# Patient Record
Sex: Female | Born: 1950 | Race: White | Hispanic: No | Smoking: Former smoker
Health system: Southern US, Community
[De-identification: ages and names within clinical notes are randomized; demographics above are authoritative.]

## PROBLEM LIST (undated history)

## (undated) DIAGNOSIS — F209 Schizophrenia, unspecified: Secondary | ICD-10-CM

## (undated) DIAGNOSIS — F29 Unspecified psychosis not due to a substance or known physiological condition: Secondary | ICD-10-CM

## (undated) DIAGNOSIS — K8020 Calculus of gallbladder without cholecystitis without obstruction: Secondary | ICD-10-CM

## (undated) DIAGNOSIS — R51 Headache: Secondary | ICD-10-CM

## (undated) DIAGNOSIS — J189 Pneumonia, unspecified organism: Secondary | ICD-10-CM

## (undated) DIAGNOSIS — I209 Angina pectoris, unspecified: Secondary | ICD-10-CM

## (undated) DIAGNOSIS — R569 Unspecified convulsions: Secondary | ICD-10-CM

## (undated) DIAGNOSIS — E669 Obesity, unspecified: Secondary | ICD-10-CM

## (undated) DIAGNOSIS — Z9289 Personal history of other medical treatment: Secondary | ICD-10-CM

## (undated) DIAGNOSIS — G40909 Epilepsy, unspecified, not intractable, without status epilepticus: Secondary | ICD-10-CM

## (undated) DIAGNOSIS — I4892 Unspecified atrial flutter: Secondary | ICD-10-CM

## (undated) DIAGNOSIS — G8929 Other chronic pain: Secondary | ICD-10-CM

## (undated) DIAGNOSIS — R519 Headache, unspecified: Secondary | ICD-10-CM

## (undated) DIAGNOSIS — E079 Disorder of thyroid, unspecified: Secondary | ICD-10-CM

## (undated) DIAGNOSIS — K219 Gastro-esophageal reflux disease without esophagitis: Secondary | ICD-10-CM

## (undated) DIAGNOSIS — I4891 Unspecified atrial fibrillation: Secondary | ICD-10-CM

## (undated) DIAGNOSIS — F319 Bipolar disorder, unspecified: Secondary | ICD-10-CM

## (undated) DIAGNOSIS — I10 Essential (primary) hypertension: Secondary | ICD-10-CM

## (undated) DIAGNOSIS — R0689 Other abnormalities of breathing: Secondary | ICD-10-CM

## (undated) DIAGNOSIS — J449 Chronic obstructive pulmonary disease, unspecified: Secondary | ICD-10-CM

## (undated) DIAGNOSIS — F419 Anxiety disorder, unspecified: Secondary | ICD-10-CM

## (undated) DIAGNOSIS — D649 Anemia, unspecified: Secondary | ICD-10-CM

## (undated) DIAGNOSIS — R0600 Dyspnea, unspecified: Secondary | ICD-10-CM

## (undated) DIAGNOSIS — F70 Mild intellectual disabilities: Secondary | ICD-10-CM

## (undated) DIAGNOSIS — N390 Urinary tract infection, site not specified: Secondary | ICD-10-CM

## (undated) DIAGNOSIS — M1990 Unspecified osteoarthritis, unspecified site: Secondary | ICD-10-CM

## (undated) DIAGNOSIS — E039 Hypothyroidism, unspecified: Secondary | ICD-10-CM

## (undated) DIAGNOSIS — E7800 Pure hypercholesterolemia, unspecified: Secondary | ICD-10-CM

## (undated) DIAGNOSIS — F39 Unspecified mood [affective] disorder: Secondary | ICD-10-CM

## (undated) DIAGNOSIS — I219 Acute myocardial infarction, unspecified: Secondary | ICD-10-CM

## (undated) DIAGNOSIS — I509 Heart failure, unspecified: Secondary | ICD-10-CM

## (undated) DIAGNOSIS — Z8719 Personal history of other diseases of the digestive system: Secondary | ICD-10-CM

## (undated) DIAGNOSIS — K449 Diaphragmatic hernia without obstruction or gangrene: Secondary | ICD-10-CM

## (undated) DIAGNOSIS — R0609 Other forms of dyspnea: Secondary | ICD-10-CM

## (undated) DIAGNOSIS — R0602 Shortness of breath: Secondary | ICD-10-CM

## (undated) DIAGNOSIS — F259 Schizoaffective disorder, unspecified: Secondary | ICD-10-CM

## (undated) HISTORY — DX: Mild intellectual disabilities: F70

## (undated) HISTORY — DX: Calculus of gallbladder without cholecystitis without obstruction: K80.20

## (undated) HISTORY — PX: CHOLECYSTECTOMY: SHX55

---

## 1991-06-21 DIAGNOSIS — Z9289 Personal history of other medical treatment: Secondary | ICD-10-CM

## 1991-06-21 HISTORY — DX: Personal history of other medical treatment: Z92.89

## 1991-06-21 HISTORY — PX: VAGINAL HYSTERECTOMY: SUR661

## 1996-06-20 HISTORY — PX: TUBAL LIGATION: SHX77

## 1997-10-14 ENCOUNTER — Inpatient Hospital Stay (HOSPITAL_COMMUNITY): Admission: AD | Admit: 1997-10-14 | Discharge: 1997-10-19 | Payer: Self-pay | Admitting: Psychiatry

## 1998-01-16 ENCOUNTER — Other Ambulatory Visit: Admission: RE | Admit: 1998-01-16 | Discharge: 1998-01-16 | Payer: Self-pay | Admitting: Family Medicine

## 1998-01-26 ENCOUNTER — Emergency Department (HOSPITAL_COMMUNITY): Admission: EM | Admit: 1998-01-26 | Discharge: 1998-01-26 | Payer: Self-pay | Admitting: Emergency Medicine

## 1998-02-10 ENCOUNTER — Inpatient Hospital Stay (HOSPITAL_COMMUNITY): Admission: EM | Admit: 1998-02-10 | Discharge: 1998-02-13 | Payer: Self-pay | Admitting: Emergency Medicine

## 1998-03-20 ENCOUNTER — Emergency Department (HOSPITAL_COMMUNITY): Admission: EM | Admit: 1998-03-20 | Discharge: 1998-03-20 | Payer: Self-pay | Admitting: Emergency Medicine

## 1998-04-30 ENCOUNTER — Encounter: Payer: Self-pay | Admitting: Emergency Medicine

## 1998-04-30 ENCOUNTER — Inpatient Hospital Stay (HOSPITAL_COMMUNITY): Admission: EM | Admit: 1998-04-30 | Discharge: 1998-05-04 | Payer: Self-pay | Admitting: Emergency Medicine

## 1998-05-02 ENCOUNTER — Encounter: Payer: Self-pay | Admitting: Specialist

## 1998-06-15 ENCOUNTER — Inpatient Hospital Stay (HOSPITAL_COMMUNITY): Admission: RE | Admit: 1998-06-15 | Discharge: 1998-06-17 | Payer: Self-pay | Admitting: *Deleted

## 1998-06-15 ENCOUNTER — Emergency Department (HOSPITAL_COMMUNITY): Admission: EM | Admit: 1998-06-15 | Discharge: 1998-06-15 | Payer: Self-pay | Admitting: Emergency Medicine

## 1998-07-25 ENCOUNTER — Emergency Department (HOSPITAL_COMMUNITY): Admission: EM | Admit: 1998-07-25 | Discharge: 1998-07-26 | Payer: Self-pay | Admitting: Emergency Medicine

## 1998-07-26 ENCOUNTER — Encounter: Payer: Self-pay | Admitting: Emergency Medicine

## 1998-07-31 ENCOUNTER — Inpatient Hospital Stay (HOSPITAL_COMMUNITY): Admission: EM | Admit: 1998-07-31 | Discharge: 1998-08-04 | Payer: Self-pay | Admitting: Emergency Medicine

## 1998-08-31 ENCOUNTER — Emergency Department (HOSPITAL_COMMUNITY): Admission: EM | Admit: 1998-08-31 | Discharge: 1998-08-31 | Payer: Self-pay | Admitting: Emergency Medicine

## 1998-10-20 ENCOUNTER — Inpatient Hospital Stay (HOSPITAL_COMMUNITY): Admission: EM | Admit: 1998-10-20 | Discharge: 1998-10-22 | Payer: Self-pay | Admitting: Emergency Medicine

## 1998-12-29 ENCOUNTER — Emergency Department (HOSPITAL_COMMUNITY): Admission: EM | Admit: 1998-12-29 | Discharge: 1998-12-29 | Payer: Self-pay | Admitting: Emergency Medicine

## 1998-12-30 ENCOUNTER — Encounter: Payer: Self-pay | Admitting: Emergency Medicine

## 1999-07-22 ENCOUNTER — Emergency Department (HOSPITAL_COMMUNITY): Admission: EM | Admit: 1999-07-22 | Discharge: 1999-07-22 | Payer: Self-pay | Admitting: Emergency Medicine

## 1999-08-16 ENCOUNTER — Encounter: Payer: Self-pay | Admitting: Family Medicine

## 1999-08-16 ENCOUNTER — Ambulatory Visit (HOSPITAL_COMMUNITY): Admission: RE | Admit: 1999-08-16 | Discharge: 1999-08-16 | Payer: Self-pay | Admitting: Family Medicine

## 1999-10-29 ENCOUNTER — Emergency Department (HOSPITAL_COMMUNITY): Admission: EM | Admit: 1999-10-29 | Discharge: 1999-10-29 | Payer: Self-pay | Admitting: Emergency Medicine

## 1999-12-28 ENCOUNTER — Emergency Department (HOSPITAL_COMMUNITY): Admission: EM | Admit: 1999-12-28 | Discharge: 1999-12-28 | Payer: Self-pay | Admitting: Emergency Medicine

## 1999-12-28 ENCOUNTER — Encounter: Payer: Self-pay | Admitting: Emergency Medicine

## 2000-02-04 ENCOUNTER — Encounter: Payer: Self-pay | Admitting: Internal Medicine

## 2000-02-04 ENCOUNTER — Encounter: Admission: RE | Admit: 2000-02-04 | Discharge: 2000-02-04 | Payer: Self-pay | Admitting: Internal Medicine

## 2000-02-24 ENCOUNTER — Emergency Department (HOSPITAL_COMMUNITY): Admission: EM | Admit: 2000-02-24 | Discharge: 2000-02-24 | Payer: Self-pay | Admitting: Emergency Medicine

## 2000-02-28 ENCOUNTER — Encounter: Payer: Self-pay | Admitting: Internal Medicine

## 2000-02-28 ENCOUNTER — Ambulatory Visit (HOSPITAL_COMMUNITY): Admission: RE | Admit: 2000-02-28 | Discharge: 2000-02-28 | Payer: Self-pay | Admitting: Internal Medicine

## 2000-03-15 ENCOUNTER — Ambulatory Visit (HOSPITAL_COMMUNITY): Admission: RE | Admit: 2000-03-15 | Discharge: 2000-03-15 | Payer: Self-pay | Admitting: Gastroenterology

## 2000-03-15 ENCOUNTER — Encounter: Payer: Self-pay | Admitting: Gastroenterology

## 2000-04-01 ENCOUNTER — Emergency Department (HOSPITAL_COMMUNITY): Admission: EM | Admit: 2000-04-01 | Discharge: 2000-04-01 | Payer: Self-pay | Admitting: Emergency Medicine

## 2000-06-20 DIAGNOSIS — I219 Acute myocardial infarction, unspecified: Secondary | ICD-10-CM

## 2000-06-20 HISTORY — DX: Acute myocardial infarction, unspecified: I21.9

## 2000-08-02 ENCOUNTER — Encounter: Payer: Self-pay | Admitting: Emergency Medicine

## 2000-08-03 ENCOUNTER — Inpatient Hospital Stay (HOSPITAL_COMMUNITY): Admission: EM | Admit: 2000-08-03 | Discharge: 2000-08-07 | Payer: Self-pay | Admitting: *Deleted

## 2000-11-09 ENCOUNTER — Other Ambulatory Visit: Admission: RE | Admit: 2000-11-09 | Discharge: 2000-11-09 | Payer: Self-pay | Admitting: Family Medicine

## 2000-11-10 ENCOUNTER — Inpatient Hospital Stay (HOSPITAL_COMMUNITY): Admission: EM | Admit: 2000-11-10 | Discharge: 2000-11-13 | Payer: Self-pay | Admitting: *Deleted

## 2000-11-19 ENCOUNTER — Emergency Department (HOSPITAL_COMMUNITY): Admission: EM | Admit: 2000-11-19 | Discharge: 2000-11-19 | Payer: Self-pay | Admitting: Emergency Medicine

## 2000-12-06 ENCOUNTER — Emergency Department (HOSPITAL_COMMUNITY): Admission: EM | Admit: 2000-12-06 | Discharge: 2000-12-06 | Payer: Self-pay | Admitting: Emergency Medicine

## 2000-12-23 ENCOUNTER — Encounter: Payer: Self-pay | Admitting: Emergency Medicine

## 2000-12-23 ENCOUNTER — Emergency Department (HOSPITAL_COMMUNITY): Admission: EM | Admit: 2000-12-23 | Discharge: 2000-12-23 | Payer: Self-pay | Admitting: Emergency Medicine

## 2001-01-01 ENCOUNTER — Inpatient Hospital Stay (HOSPITAL_COMMUNITY): Admission: EM | Admit: 2001-01-01 | Discharge: 2001-01-03 | Payer: Self-pay | Admitting: *Deleted

## 2001-01-25 ENCOUNTER — Emergency Department (HOSPITAL_COMMUNITY): Admission: EM | Admit: 2001-01-25 | Discharge: 2001-01-25 | Payer: Self-pay | Admitting: Emergency Medicine

## 2001-01-29 ENCOUNTER — Emergency Department (HOSPITAL_COMMUNITY): Admission: EM | Admit: 2001-01-29 | Discharge: 2001-01-29 | Payer: Self-pay | Admitting: Emergency Medicine

## 2001-02-20 ENCOUNTER — Encounter: Admission: RE | Admit: 2001-02-20 | Discharge: 2001-02-20 | Payer: Self-pay | Admitting: Internal Medicine

## 2001-05-14 ENCOUNTER — Emergency Department (HOSPITAL_COMMUNITY): Admission: EM | Admit: 2001-05-14 | Discharge: 2001-05-14 | Payer: Self-pay | Admitting: Emergency Medicine

## 2001-05-22 ENCOUNTER — Encounter: Admission: RE | Admit: 2001-05-22 | Discharge: 2001-05-22 | Payer: Self-pay | Admitting: Obstetrics & Gynecology

## 2001-05-27 ENCOUNTER — Emergency Department (HOSPITAL_COMMUNITY): Admission: EM | Admit: 2001-05-27 | Discharge: 2001-05-27 | Payer: Self-pay | Admitting: Emergency Medicine

## 2001-07-01 ENCOUNTER — Inpatient Hospital Stay (HOSPITAL_COMMUNITY): Admission: AD | Admit: 2001-07-01 | Discharge: 2001-07-01 | Payer: Self-pay | Admitting: Obstetrics & Gynecology

## 2001-07-03 ENCOUNTER — Encounter: Admission: RE | Admit: 2001-07-03 | Discharge: 2001-07-03 | Payer: Self-pay | Admitting: Obstetrics & Gynecology

## 2001-07-07 ENCOUNTER — Inpatient Hospital Stay (HOSPITAL_COMMUNITY): Admission: EM | Admit: 2001-07-07 | Discharge: 2001-07-09 | Payer: Self-pay | Admitting: Infectious Diseases

## 2001-07-07 ENCOUNTER — Encounter: Payer: Self-pay | Admitting: Emergency Medicine

## 2001-07-07 ENCOUNTER — Emergency Department (HOSPITAL_COMMUNITY): Admission: EM | Admit: 2001-07-07 | Discharge: 2001-07-07 | Payer: Self-pay | Admitting: Emergency Medicine

## 2001-07-09 ENCOUNTER — Inpatient Hospital Stay (HOSPITAL_COMMUNITY): Admission: EM | Admit: 2001-07-09 | Discharge: 2001-07-19 | Payer: Self-pay | Admitting: Psychiatry

## 2001-07-29 ENCOUNTER — Inpatient Hospital Stay (HOSPITAL_COMMUNITY): Admission: EM | Admit: 2001-07-29 | Discharge: 2001-08-08 | Payer: Self-pay | Admitting: Psychiatry

## 2001-08-25 ENCOUNTER — Inpatient Hospital Stay (HOSPITAL_COMMUNITY): Admission: AD | Admit: 2001-08-25 | Discharge: 2001-08-25 | Payer: Self-pay | Admitting: *Deleted

## 2001-11-19 ENCOUNTER — Inpatient Hospital Stay (HOSPITAL_COMMUNITY): Admission: AD | Admit: 2001-11-19 | Discharge: 2001-11-19 | Payer: Self-pay | Admitting: Obstetrics and Gynecology

## 2001-12-02 ENCOUNTER — Emergency Department (HOSPITAL_COMMUNITY): Admission: EM | Admit: 2001-12-02 | Discharge: 2001-12-02 | Payer: Self-pay | Admitting: Emergency Medicine

## 2001-12-02 ENCOUNTER — Encounter: Payer: Self-pay | Admitting: Emergency Medicine

## 2001-12-06 ENCOUNTER — Inpatient Hospital Stay (HOSPITAL_COMMUNITY): Admission: EM | Admit: 2001-12-06 | Discharge: 2001-12-10 | Payer: Self-pay | Admitting: *Deleted

## 2001-12-27 ENCOUNTER — Emergency Department (HOSPITAL_COMMUNITY): Admission: EM | Admit: 2001-12-27 | Discharge: 2001-12-27 | Payer: Self-pay | Admitting: Emergency Medicine

## 2002-01-08 ENCOUNTER — Emergency Department (HOSPITAL_COMMUNITY): Admission: EM | Admit: 2002-01-08 | Discharge: 2002-01-08 | Payer: Self-pay | Admitting: Emergency Medicine

## 2002-02-03 ENCOUNTER — Encounter: Payer: Self-pay | Admitting: *Deleted

## 2002-02-03 ENCOUNTER — Emergency Department (HOSPITAL_COMMUNITY): Admission: EM | Admit: 2002-02-03 | Discharge: 2002-02-03 | Payer: Self-pay | Admitting: *Deleted

## 2002-02-13 ENCOUNTER — Emergency Department (HOSPITAL_COMMUNITY): Admission: EM | Admit: 2002-02-13 | Discharge: 2002-02-13 | Payer: Self-pay | Admitting: Emergency Medicine

## 2002-02-20 ENCOUNTER — Inpatient Hospital Stay (HOSPITAL_COMMUNITY): Admission: EM | Admit: 2002-02-20 | Discharge: 2002-02-22 | Payer: Self-pay | Admitting: *Deleted

## 2002-02-21 ENCOUNTER — Encounter (INDEPENDENT_AMBULATORY_CARE_PROVIDER_SITE_OTHER): Payer: Self-pay | Admitting: Cardiology

## 2002-02-22 ENCOUNTER — Encounter: Payer: Self-pay | Admitting: Internal Medicine

## 2002-03-07 ENCOUNTER — Emergency Department (HOSPITAL_COMMUNITY): Admission: EM | Admit: 2002-03-07 | Discharge: 2002-03-07 | Payer: Self-pay | Admitting: Emergency Medicine

## 2002-03-15 ENCOUNTER — Encounter: Payer: Self-pay | Admitting: Hematology and Oncology

## 2002-03-15 ENCOUNTER — Encounter: Admission: RE | Admit: 2002-03-15 | Discharge: 2002-03-15 | Payer: Self-pay | Admitting: Hematology and Oncology

## 2002-03-15 ENCOUNTER — Emergency Department (HOSPITAL_COMMUNITY): Admission: EM | Admit: 2002-03-15 | Discharge: 2002-03-15 | Payer: Self-pay | Admitting: Emergency Medicine

## 2002-03-15 ENCOUNTER — Encounter: Payer: Self-pay | Admitting: Emergency Medicine

## 2002-04-09 ENCOUNTER — Emergency Department (HOSPITAL_COMMUNITY): Admission: EM | Admit: 2002-04-09 | Discharge: 2002-04-09 | Payer: Self-pay | Admitting: Emergency Medicine

## 2002-08-28 ENCOUNTER — Emergency Department (HOSPITAL_COMMUNITY): Admission: EM | Admit: 2002-08-28 | Discharge: 2002-08-28 | Payer: Self-pay | Admitting: Emergency Medicine

## 2002-08-28 ENCOUNTER — Encounter: Payer: Self-pay | Admitting: Emergency Medicine

## 2002-10-03 ENCOUNTER — Other Ambulatory Visit: Admission: RE | Admit: 2002-10-03 | Discharge: 2002-10-03 | Payer: Self-pay | Admitting: Family Medicine

## 2002-10-20 ENCOUNTER — Emergency Department (HOSPITAL_COMMUNITY): Admission: EM | Admit: 2002-10-20 | Discharge: 2002-10-20 | Payer: Self-pay | Admitting: Emergency Medicine

## 2002-11-01 ENCOUNTER — Inpatient Hospital Stay (HOSPITAL_COMMUNITY): Admission: EM | Admit: 2002-11-01 | Discharge: 2002-11-05 | Payer: Self-pay | Admitting: Psychiatry

## 2002-11-06 ENCOUNTER — Emergency Department (HOSPITAL_COMMUNITY): Admission: EM | Admit: 2002-11-06 | Discharge: 2002-11-07 | Payer: Self-pay

## 2002-11-07 ENCOUNTER — Emergency Department (HOSPITAL_COMMUNITY): Admission: EM | Admit: 2002-11-07 | Discharge: 2002-11-07 | Payer: Self-pay | Admitting: Emergency Medicine

## 2002-11-08 ENCOUNTER — Inpatient Hospital Stay (HOSPITAL_COMMUNITY): Admission: EM | Admit: 2002-11-08 | Discharge: 2002-11-20 | Payer: Self-pay | Admitting: Psychiatry

## 2002-11-22 ENCOUNTER — Emergency Department (HOSPITAL_COMMUNITY): Admission: EM | Admit: 2002-11-22 | Discharge: 2002-11-22 | Payer: Self-pay | Admitting: Emergency Medicine

## 2002-11-29 ENCOUNTER — Encounter: Payer: Self-pay | Admitting: Emergency Medicine

## 2002-11-29 ENCOUNTER — Observation Stay (HOSPITAL_COMMUNITY): Admission: EM | Admit: 2002-11-29 | Discharge: 2002-11-30 | Payer: Self-pay | Admitting: Emergency Medicine

## 2002-12-04 ENCOUNTER — Emergency Department (HOSPITAL_COMMUNITY): Admission: EM | Admit: 2002-12-04 | Discharge: 2002-12-04 | Payer: Self-pay | Admitting: Emergency Medicine

## 2002-12-04 ENCOUNTER — Encounter: Payer: Self-pay | Admitting: Emergency Medicine

## 2002-12-06 ENCOUNTER — Inpatient Hospital Stay (HOSPITAL_COMMUNITY): Admission: EM | Admit: 2002-12-06 | Discharge: 2002-12-11 | Payer: Self-pay | Admitting: Psychiatry

## 2002-12-26 ENCOUNTER — Inpatient Hospital Stay (HOSPITAL_COMMUNITY): Admission: AD | Admit: 2002-12-26 | Discharge: 2002-12-26 | Payer: Self-pay | Admitting: Obstetrics & Gynecology

## 2003-01-08 ENCOUNTER — Encounter: Payer: Self-pay | Admitting: Emergency Medicine

## 2003-01-08 ENCOUNTER — Emergency Department (HOSPITAL_COMMUNITY): Admission: EM | Admit: 2003-01-08 | Discharge: 2003-01-08 | Payer: Self-pay | Admitting: Emergency Medicine

## 2003-01-16 ENCOUNTER — Emergency Department (HOSPITAL_COMMUNITY): Admission: EM | Admit: 2003-01-16 | Discharge: 2003-01-16 | Payer: Self-pay

## 2003-01-22 ENCOUNTER — Emergency Department (HOSPITAL_COMMUNITY): Admission: EM | Admit: 2003-01-22 | Discharge: 2003-01-22 | Payer: Self-pay | Admitting: Emergency Medicine

## 2003-01-22 ENCOUNTER — Encounter: Payer: Self-pay | Admitting: Emergency Medicine

## 2003-01-31 ENCOUNTER — Emergency Department (HOSPITAL_COMMUNITY): Admission: EM | Admit: 2003-01-31 | Discharge: 2003-01-31 | Payer: Self-pay | Admitting: Emergency Medicine

## 2003-02-26 ENCOUNTER — Emergency Department (HOSPITAL_COMMUNITY): Admission: EM | Admit: 2003-02-26 | Discharge: 2003-02-26 | Payer: Self-pay

## 2003-03-17 ENCOUNTER — Emergency Department (HOSPITAL_COMMUNITY): Admission: EM | Admit: 2003-03-17 | Discharge: 2003-03-17 | Payer: Self-pay

## 2003-04-23 ENCOUNTER — Emergency Department (HOSPITAL_COMMUNITY): Admission: EM | Admit: 2003-04-23 | Discharge: 2003-04-23 | Payer: Self-pay | Admitting: Emergency Medicine

## 2003-08-07 ENCOUNTER — Emergency Department (HOSPITAL_COMMUNITY): Admission: EM | Admit: 2003-08-07 | Discharge: 2003-08-07 | Payer: Self-pay | Admitting: Emergency Medicine

## 2003-12-24 ENCOUNTER — Inpatient Hospital Stay (HOSPITAL_COMMUNITY): Admission: AD | Admit: 2003-12-24 | Discharge: 2003-12-29 | Payer: Self-pay | Admitting: Psychiatry

## 2004-02-24 ENCOUNTER — Emergency Department (HOSPITAL_COMMUNITY): Admission: EM | Admit: 2004-02-24 | Discharge: 2004-02-24 | Payer: Self-pay | Admitting: Emergency Medicine

## 2004-04-14 ENCOUNTER — Ambulatory Visit: Payer: Self-pay | Admitting: Internal Medicine

## 2004-05-25 ENCOUNTER — Ambulatory Visit (HOSPITAL_COMMUNITY): Admission: RE | Admit: 2004-05-25 | Discharge: 2004-05-25 | Payer: Self-pay | Admitting: Nurse Practitioner

## 2004-08-10 ENCOUNTER — Ambulatory Visit: Payer: Self-pay | Admitting: Family Medicine

## 2004-08-10 DIAGNOSIS — G244 Idiopathic orofacial dystonia: Secondary | ICD-10-CM

## 2004-10-21 ENCOUNTER — Emergency Department (HOSPITAL_COMMUNITY): Admission: EM | Admit: 2004-10-21 | Discharge: 2004-10-21 | Payer: Self-pay | Admitting: Emergency Medicine

## 2004-11-22 ENCOUNTER — Ambulatory Visit: Payer: Self-pay | Admitting: Family Medicine

## 2004-12-30 ENCOUNTER — Ambulatory Visit: Payer: Self-pay | Admitting: Family Medicine

## 2005-02-03 ENCOUNTER — Ambulatory Visit: Payer: Self-pay | Admitting: Family Medicine

## 2005-02-25 ENCOUNTER — Encounter: Admission: RE | Admit: 2005-02-25 | Discharge: 2005-02-25 | Payer: Self-pay | Admitting: Internal Medicine

## 2005-03-11 ENCOUNTER — Ambulatory Visit: Payer: Self-pay | Admitting: Family Medicine

## 2005-03-11 DIAGNOSIS — J309 Allergic rhinitis, unspecified: Secondary | ICD-10-CM | POA: Insufficient documentation

## 2005-06-23 ENCOUNTER — Emergency Department (HOSPITAL_COMMUNITY): Admission: EM | Admit: 2005-06-23 | Discharge: 2005-06-23 | Payer: Self-pay | Admitting: Emergency Medicine

## 2005-06-29 ENCOUNTER — Emergency Department (HOSPITAL_COMMUNITY): Admission: EM | Admit: 2005-06-29 | Discharge: 2005-06-29 | Payer: Self-pay | Admitting: Emergency Medicine

## 2005-07-01 ENCOUNTER — Ambulatory Visit: Payer: Self-pay | Admitting: Family Medicine

## 2005-09-03 ENCOUNTER — Emergency Department (HOSPITAL_COMMUNITY): Admission: EM | Admit: 2005-09-03 | Discharge: 2005-09-03 | Payer: Self-pay | Admitting: Emergency Medicine

## 2005-09-05 ENCOUNTER — Encounter (INDEPENDENT_AMBULATORY_CARE_PROVIDER_SITE_OTHER): Payer: Self-pay | Admitting: Family Medicine

## 2005-09-05 ENCOUNTER — Other Ambulatory Visit: Admission: RE | Admit: 2005-09-05 | Discharge: 2005-09-05 | Payer: Self-pay | Admitting: Family Medicine

## 2005-09-05 ENCOUNTER — Ambulatory Visit: Payer: Self-pay | Admitting: Family Medicine

## 2005-09-22 IMAGING — CR DG KNEE COMPLETE 4+V*R*
4 series · 4 of 4 positions shown · non-contrast
Comparison: none

CLINICAL DATA: Right knee pain.
 RIGHT KNEE, FOUR VIEWS:
 Four-view exam shows moderately advanced tricompartmental degenerative change, especially medially.  No definite acute abnormality of the bony structures.  No definite joint effusion.

[view not recorded (1 of 4)]
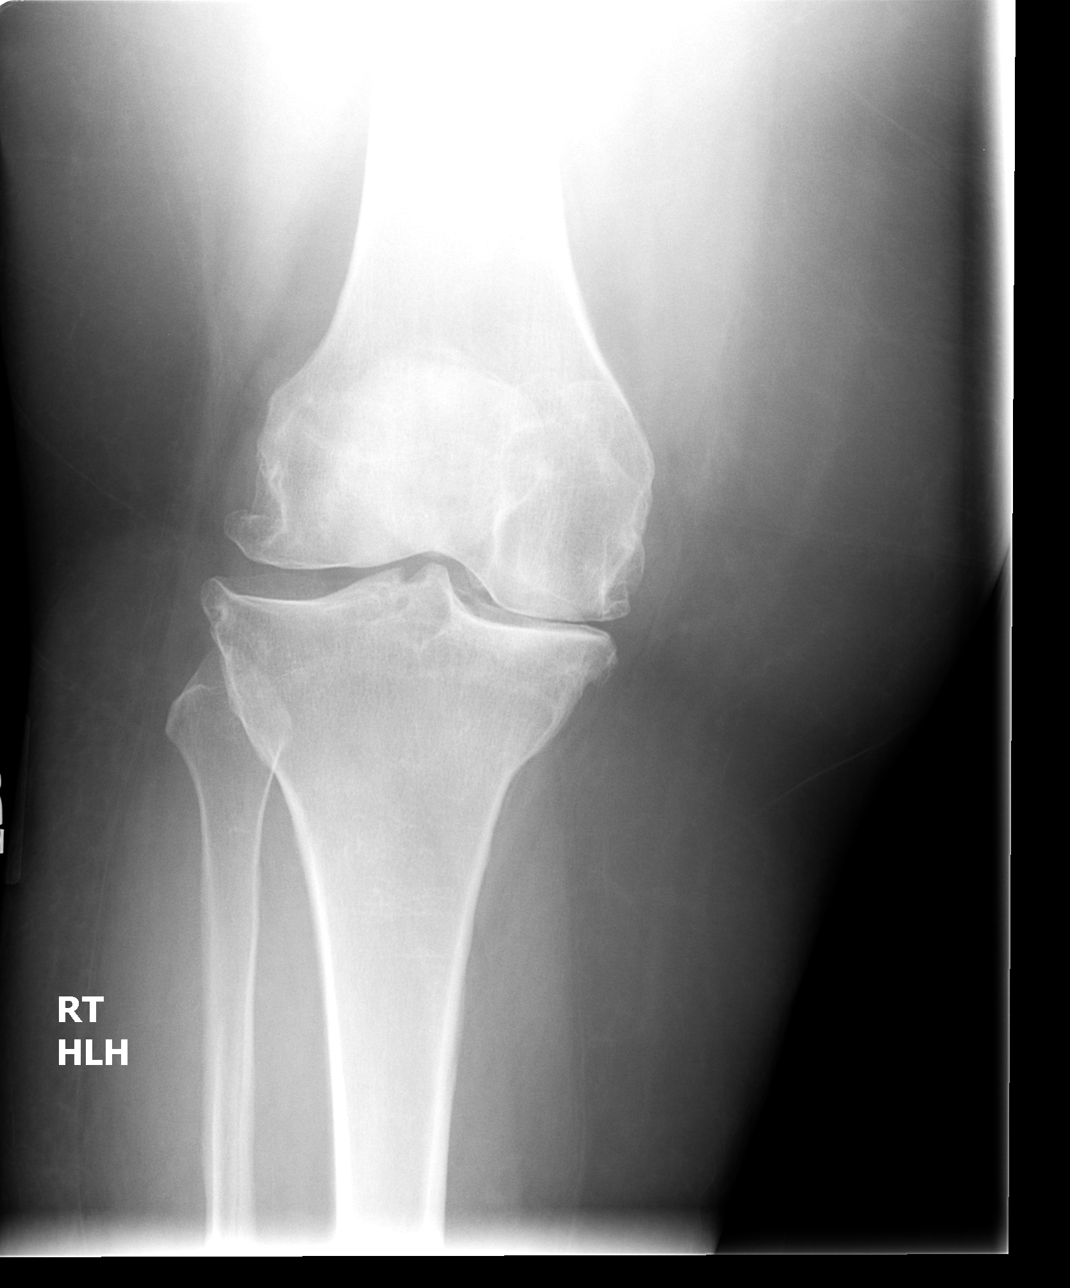

[view not recorded (2 of 4)]
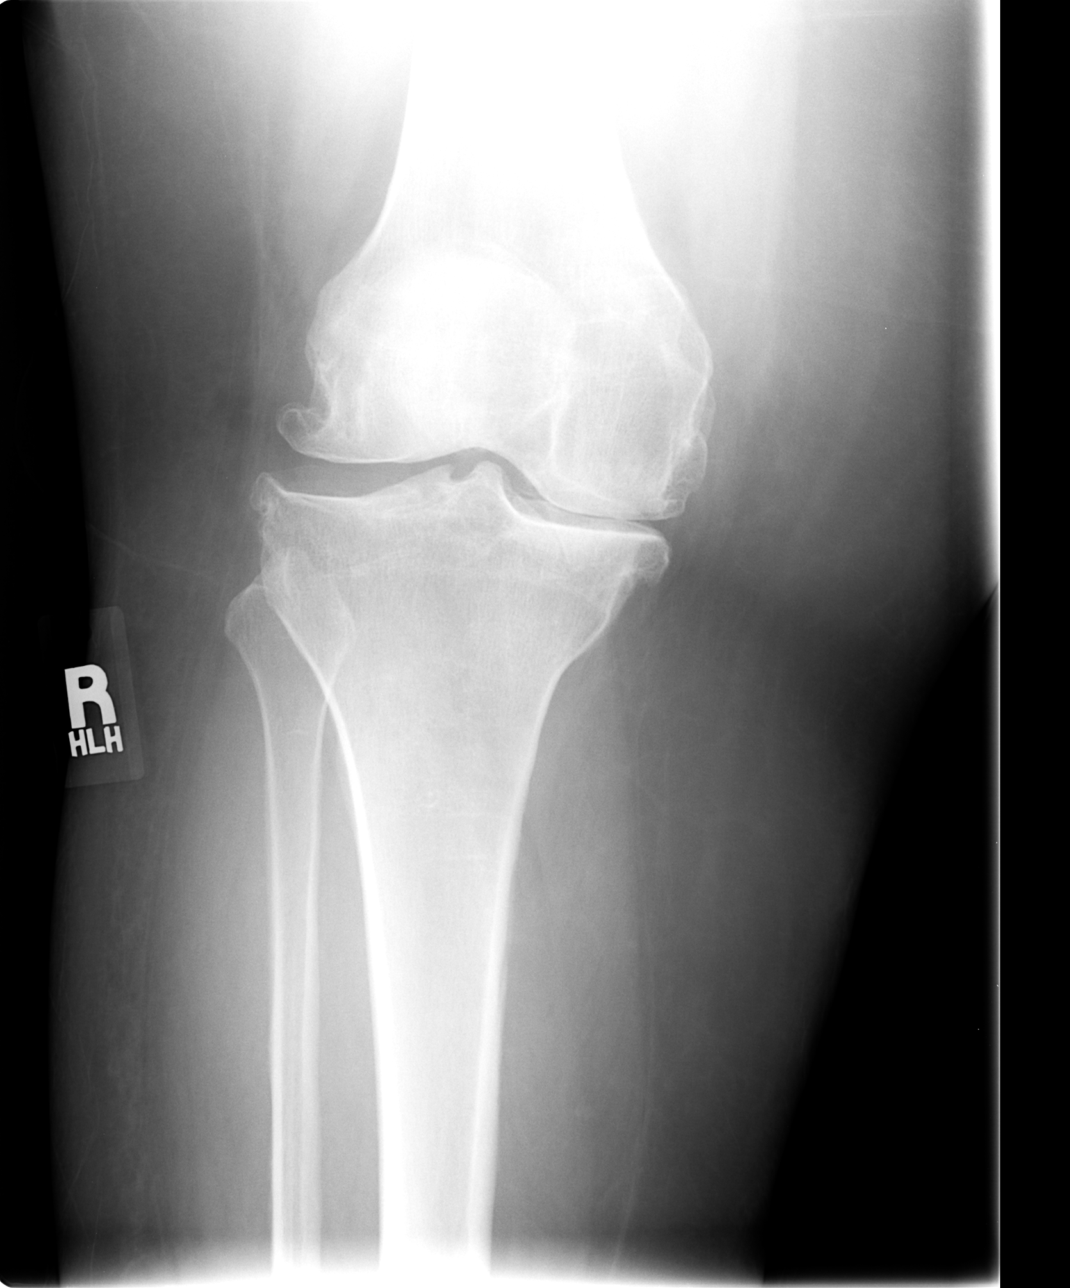

[view not recorded (3 of 4)]
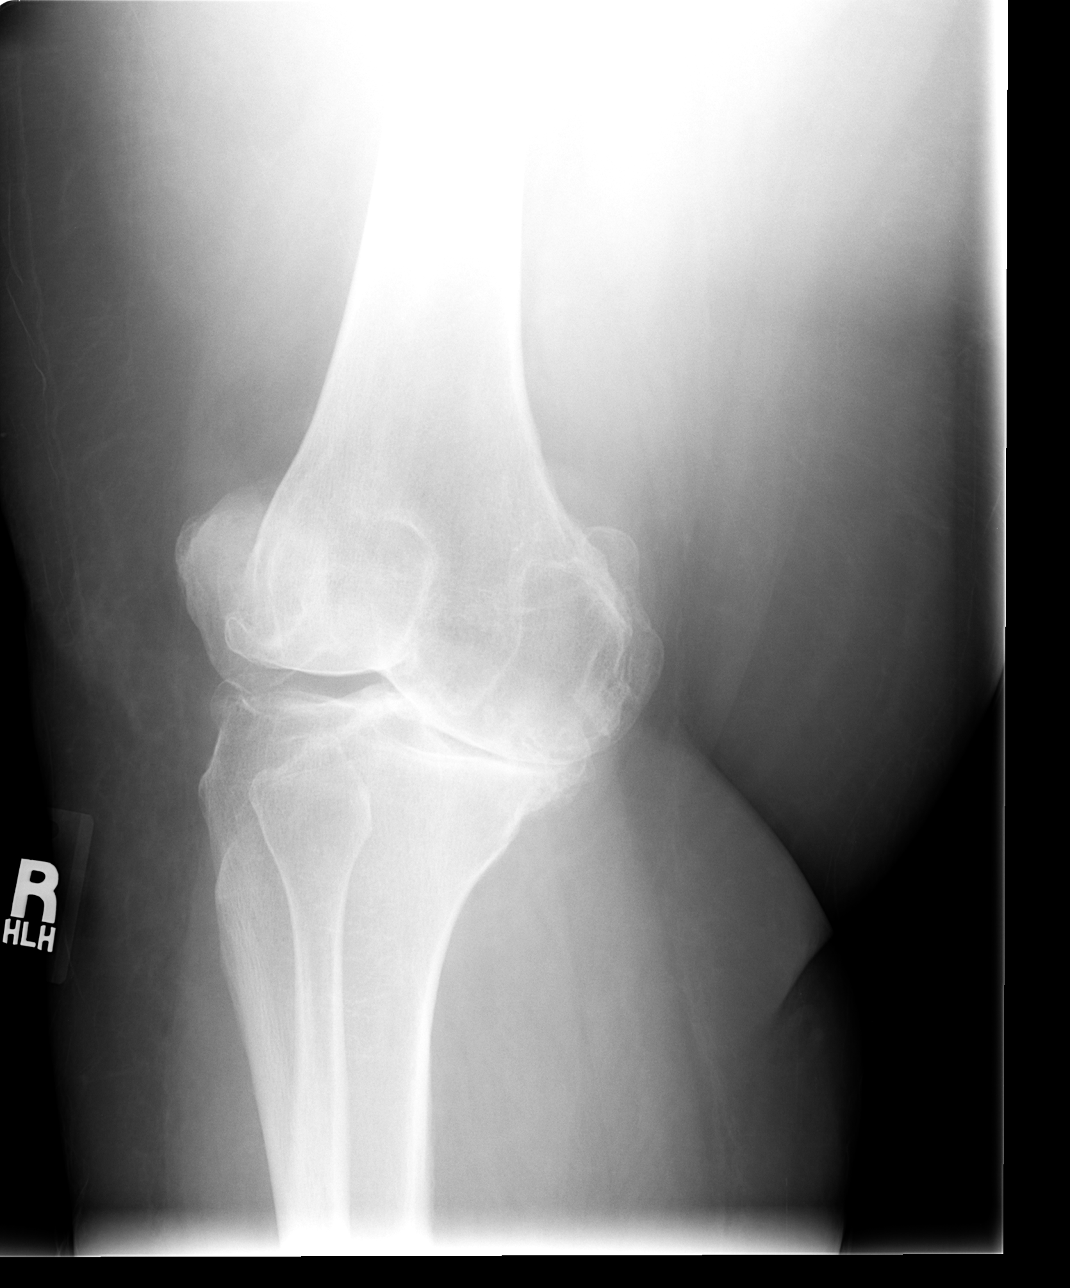

[view not recorded (4 of 4)]
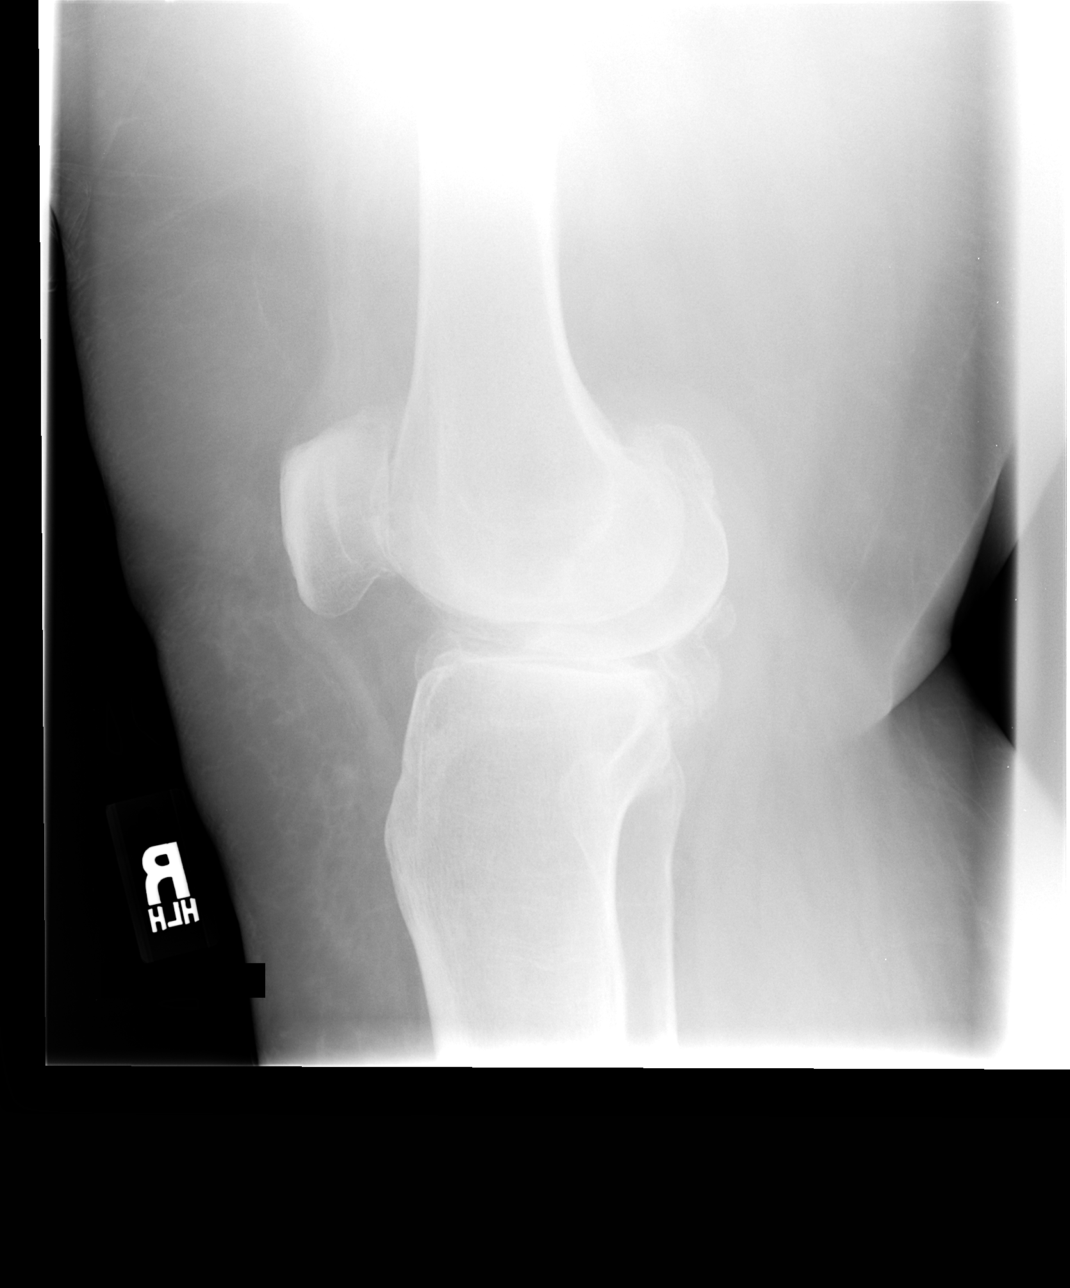

[4 of 4 positions shown; findings below may reference images not displayed]

IMPRESSION: Moderately advanced degenerative changes.  No definite acute abnormality.

## 2005-10-27 ENCOUNTER — Observation Stay (HOSPITAL_COMMUNITY): Admission: EM | Admit: 2005-10-27 | Discharge: 2005-10-29 | Payer: Self-pay | Admitting: Emergency Medicine

## 2005-10-27 ENCOUNTER — Ambulatory Visit: Payer: Self-pay | Admitting: Hospitalist

## 2005-11-15 ENCOUNTER — Ambulatory Visit: Payer: Self-pay | Admitting: Family Medicine

## 2005-12-06 ENCOUNTER — Emergency Department (HOSPITAL_COMMUNITY): Admission: EM | Admit: 2005-12-06 | Discharge: 2005-12-07 | Payer: Self-pay | Admitting: Emergency Medicine

## 2006-01-04 ENCOUNTER — Emergency Department (HOSPITAL_COMMUNITY): Admission: EM | Admit: 2006-01-04 | Discharge: 2006-01-04 | Payer: Self-pay | Admitting: Emergency Medicine

## 2006-01-28 ENCOUNTER — Observation Stay (HOSPITAL_COMMUNITY): Admission: EM | Admit: 2006-01-28 | Discharge: 2006-01-30 | Payer: Self-pay | Admitting: Emergency Medicine

## 2006-02-21 ENCOUNTER — Observation Stay (HOSPITAL_COMMUNITY): Admission: EM | Admit: 2006-02-21 | Discharge: 2006-02-21 | Payer: Self-pay | Admitting: Emergency Medicine

## 2006-02-21 DIAGNOSIS — Z8679 Personal history of other diseases of the circulatory system: Secondary | ICD-10-CM | POA: Insufficient documentation

## 2006-03-16 ENCOUNTER — Encounter: Admission: RE | Admit: 2006-03-16 | Discharge: 2006-03-16 | Payer: Self-pay | Admitting: Family Medicine

## 2006-03-29 ENCOUNTER — Emergency Department (HOSPITAL_COMMUNITY): Admission: EM | Admit: 2006-03-29 | Discharge: 2006-03-29 | Payer: Self-pay | Admitting: Emergency Medicine

## 2006-04-10 ENCOUNTER — Other Ambulatory Visit: Admission: RE | Admit: 2006-04-10 | Discharge: 2006-04-10 | Payer: Self-pay | Admitting: Obstetrics and Gynecology

## 2006-05-29 ENCOUNTER — Emergency Department (HOSPITAL_COMMUNITY): Admission: EM | Admit: 2006-05-29 | Discharge: 2006-05-30 | Payer: Self-pay | Admitting: Emergency Medicine

## 2006-06-16 ENCOUNTER — Emergency Department (HOSPITAL_COMMUNITY): Admission: EM | Admit: 2006-06-16 | Discharge: 2006-06-16 | Payer: Self-pay | Admitting: Emergency Medicine

## 2006-06-17 ENCOUNTER — Emergency Department (HOSPITAL_COMMUNITY): Admission: EM | Admit: 2006-06-17 | Discharge: 2006-06-17 | Payer: Self-pay | Admitting: Emergency Medicine

## 2006-06-20 HISTORY — PX: CARDIAC CATHETERIZATION: SHX172

## 2006-08-15 ENCOUNTER — Ambulatory Visit: Payer: Self-pay | Admitting: Family Medicine

## 2006-09-07 ENCOUNTER — Emergency Department (HOSPITAL_COMMUNITY): Admission: EM | Admit: 2006-09-07 | Discharge: 2006-09-07 | Payer: Self-pay | Admitting: *Deleted

## 2006-09-25 ENCOUNTER — Ambulatory Visit: Payer: Self-pay | Admitting: Internal Medicine

## 2006-10-02 ENCOUNTER — Ambulatory Visit: Payer: Self-pay | Admitting: Family Medicine

## 2006-10-08 ENCOUNTER — Emergency Department (HOSPITAL_COMMUNITY): Admission: EM | Admit: 2006-10-08 | Discharge: 2006-10-09 | Payer: Self-pay | Admitting: Emergency Medicine

## 2006-10-27 IMAGING — CT CT ANGIO CHEST
1 of 5 series · 15 of 30 positions shown · IV contrast (omnipaque)
Comparison: none

CLINICAL DATA: Left-sided chest pain.  Shortness of breath.
 CT ANGIOGRAPHY OF CHEST:
TECHNIQUE: Multidetector CT imaging of the chest was performed during bolus injection of intravenous contrast.  Multiplanar CT angiographic image reconstructions were generated to evaluate the vascular anatomy.
 Contrast:  120 cc Omnipaque 300. 
 The pulmonary arteries opacify well with no evidence of pulmonary embolism.  The thoracic aorta also appears normal. There is a small left pleural effusion present.  There is a question of small gallstone on scans through the upper abdomen.  Tiny nodule is noted in the right middle lobe of doubtful significance.

[Series 3: pe · axial · 0.70mm/px · z∈[-307,-46]mm · 15 of 484 slices shown]
[im 33/484  lung]
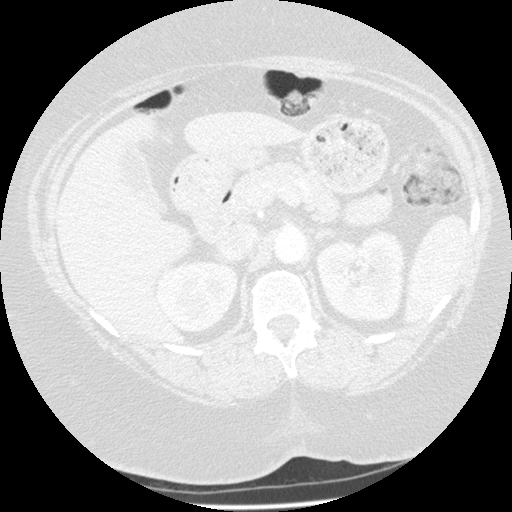
[im 65/484  mediastinal]
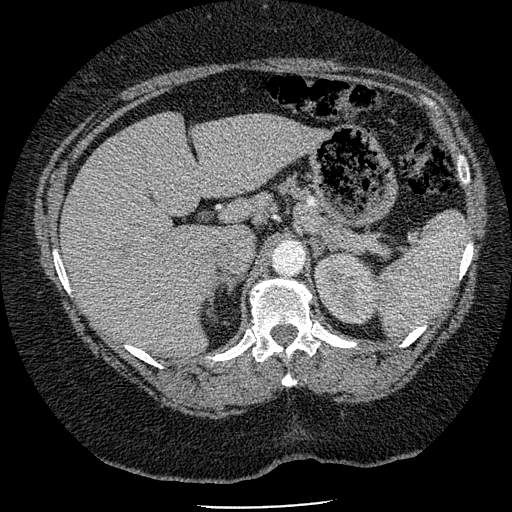
[im 97/484  lung]
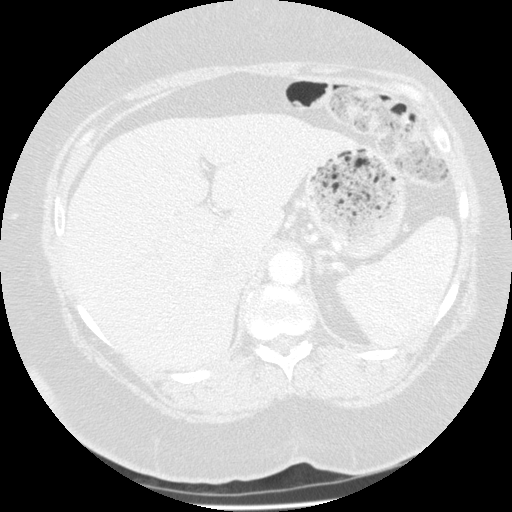
[im 129/484  mediastinal]
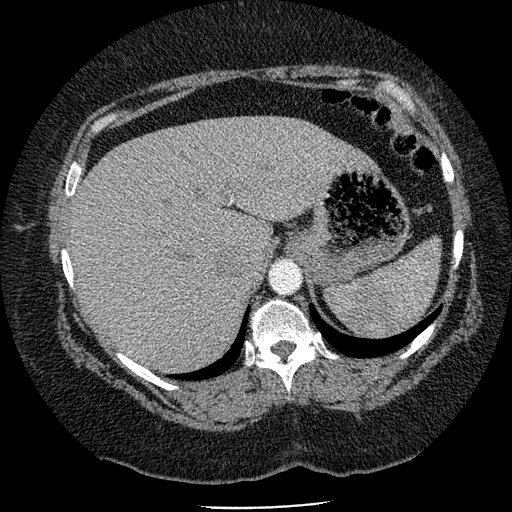
[im 162/484  lung]
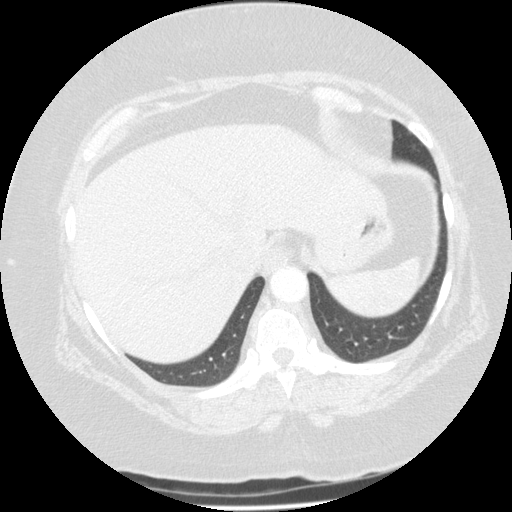
[im 194/484  mediastinal]
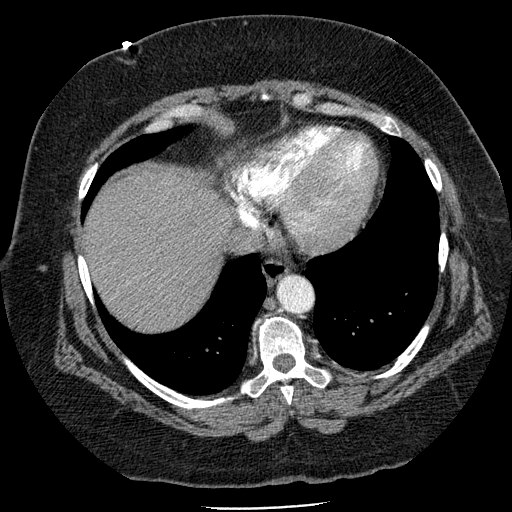
[im 226/484  lung]
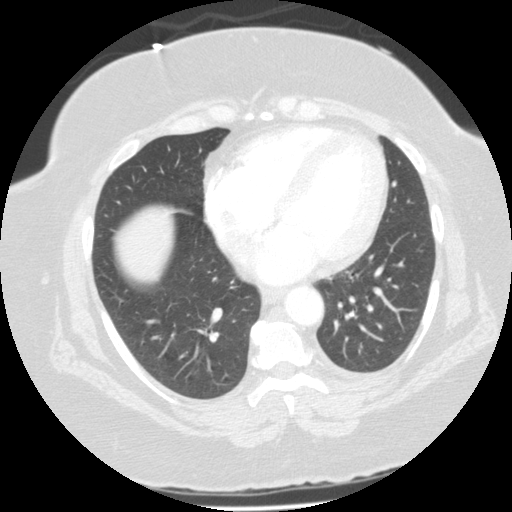
[im 227/484  mediastinal]
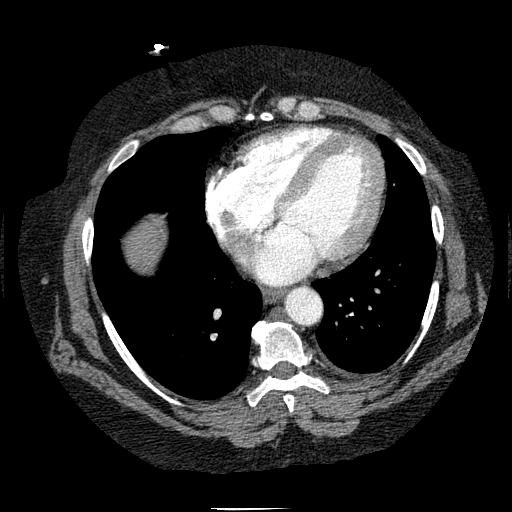
[im 258/484  lung]
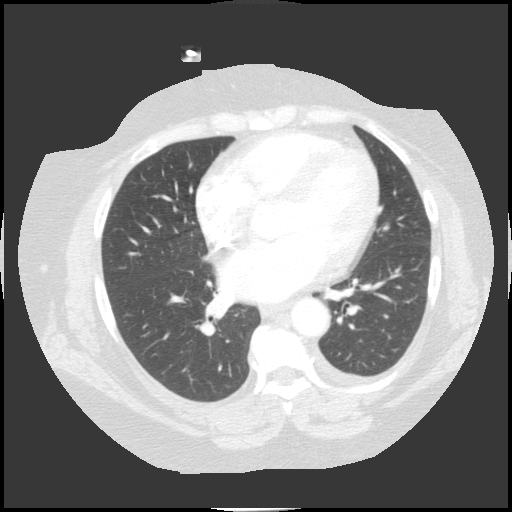
[im 290/484  mediastinal]
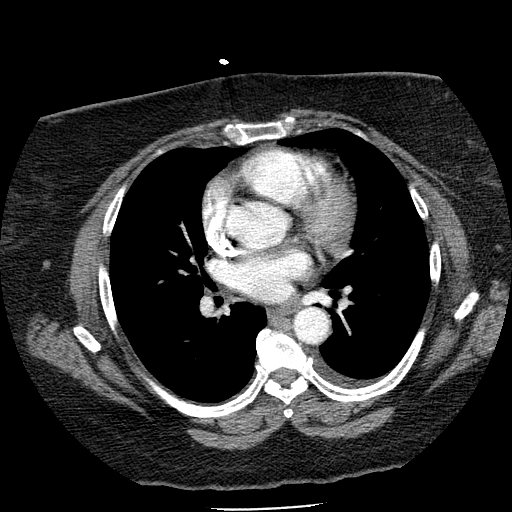
[im 323/484  lung]
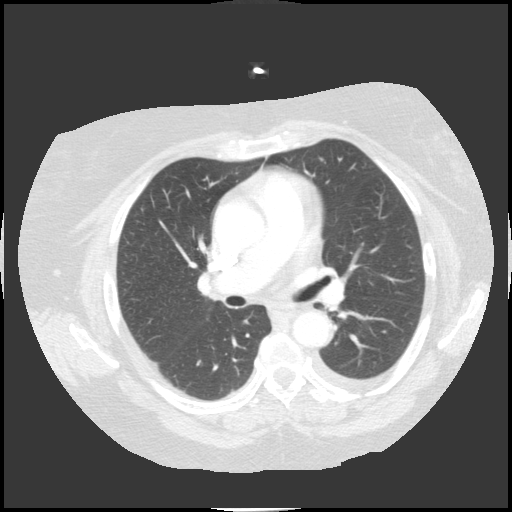
[im 355/484  mediastinal]
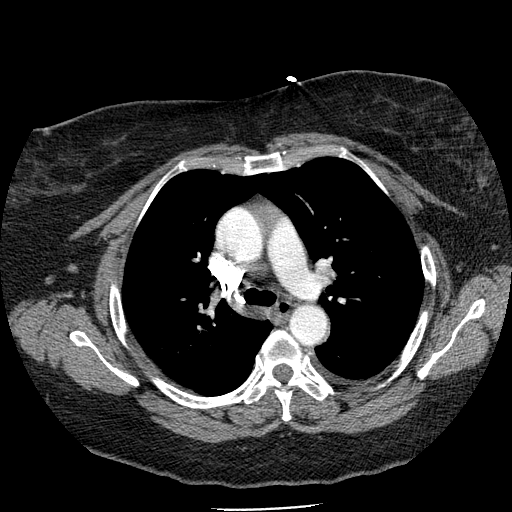
[im 387/484  lung]
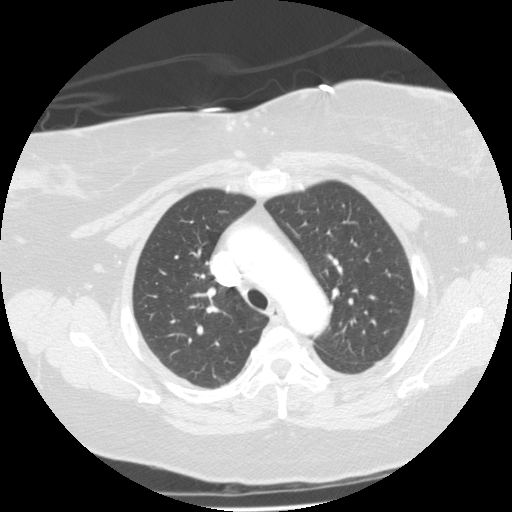
[im 419/484  mediastinal]
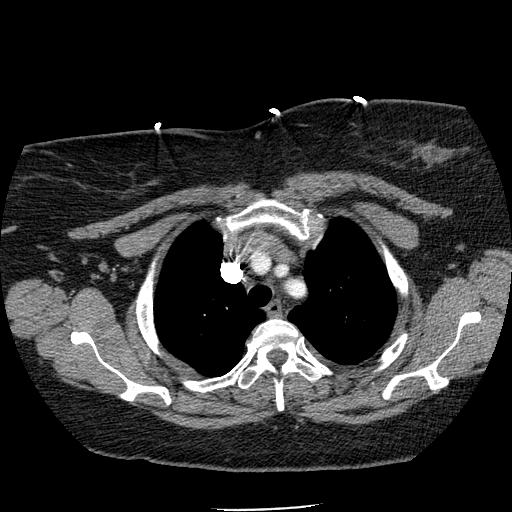
[im 451/484  lung]
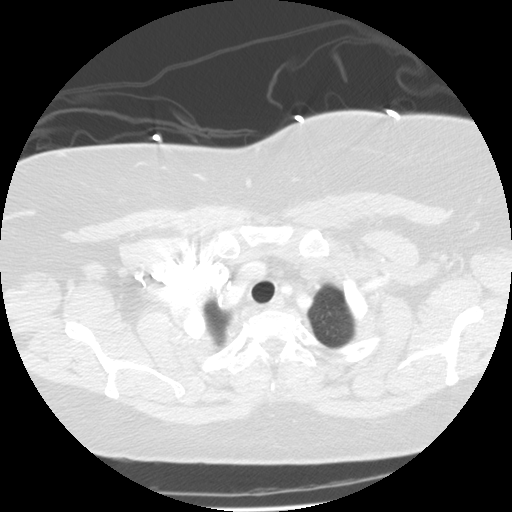

[15 of 30 positions shown; findings below may reference images not displayed]

IMPRESSION: 1.  No evidence of pulmonary embolism.  
 2.  Small left pleural effusion.  
 3.  Question faintly calcified gallstone in gallbladder.

## 2006-11-13 ENCOUNTER — Emergency Department (HOSPITAL_COMMUNITY): Admission: EM | Admit: 2006-11-13 | Discharge: 2006-11-13 | Payer: Self-pay | Admitting: Emergency Medicine

## 2006-11-29 ENCOUNTER — Ambulatory Visit: Payer: Self-pay | Admitting: Psychiatry

## 2006-11-29 ENCOUNTER — Encounter: Payer: Self-pay | Admitting: Emergency Medicine

## 2006-11-29 ENCOUNTER — Inpatient Hospital Stay (HOSPITAL_COMMUNITY): Admission: AD | Admit: 2006-11-29 | Discharge: 2006-12-01 | Payer: Self-pay | Admitting: Psychiatry

## 2007-01-12 ENCOUNTER — Emergency Department (HOSPITAL_COMMUNITY): Admission: EM | Admit: 2007-01-12 | Discharge: 2007-01-12 | Payer: Self-pay | Admitting: Emergency Medicine

## 2007-01-16 ENCOUNTER — Emergency Department (HOSPITAL_COMMUNITY): Admission: EM | Admit: 2007-01-16 | Discharge: 2007-01-17 | Payer: Self-pay | Admitting: Emergency Medicine

## 2007-01-22 ENCOUNTER — Ambulatory Visit: Payer: Self-pay | Admitting: Family Medicine

## 2007-01-24 ENCOUNTER — Other Ambulatory Visit: Payer: Self-pay

## 2007-01-24 ENCOUNTER — Other Ambulatory Visit: Payer: Self-pay | Admitting: Emergency Medicine

## 2007-01-25 ENCOUNTER — Inpatient Hospital Stay (HOSPITAL_COMMUNITY): Admission: AD | Admit: 2007-01-25 | Discharge: 2007-01-26 | Payer: Self-pay | Admitting: Psychiatry

## 2007-01-25 ENCOUNTER — Ambulatory Visit: Payer: Self-pay | Admitting: Psychiatry

## 2007-02-05 ENCOUNTER — Inpatient Hospital Stay (HOSPITAL_COMMUNITY): Admission: EM | Admit: 2007-02-05 | Discharge: 2007-02-06 | Payer: Self-pay | Admitting: *Deleted

## 2007-02-06 DIAGNOSIS — E538 Deficiency of other specified B group vitamins: Secondary | ICD-10-CM

## 2007-02-06 DIAGNOSIS — F70 Mild intellectual disabilities: Secondary | ICD-10-CM | POA: Insufficient documentation

## 2007-02-06 DIAGNOSIS — E039 Hypothyroidism, unspecified: Secondary | ICD-10-CM | POA: Insufficient documentation

## 2007-02-06 DIAGNOSIS — I10 Essential (primary) hypertension: Secondary | ICD-10-CM

## 2007-02-06 DIAGNOSIS — K219 Gastro-esophageal reflux disease without esophagitis: Secondary | ICD-10-CM

## 2007-02-06 HISTORY — DX: Mild intellectual disabilities: F70

## 2007-02-22 ENCOUNTER — Encounter (HOSPITAL_COMMUNITY): Admission: RE | Admit: 2007-02-22 | Discharge: 2007-02-23 | Payer: Self-pay | Admitting: Internal Medicine

## 2007-02-24 IMAGING — CR DG CHEST 1V PORT
1 series · 1 of 1 positions shown · non-contrast
Comparison: none

CLINICAL DATA: Chest pain. Possible seizure.
 PORTABLE CHEST - 1 VIEW - 10/27/05 AT 7399 HOURS:

[view not recorded]
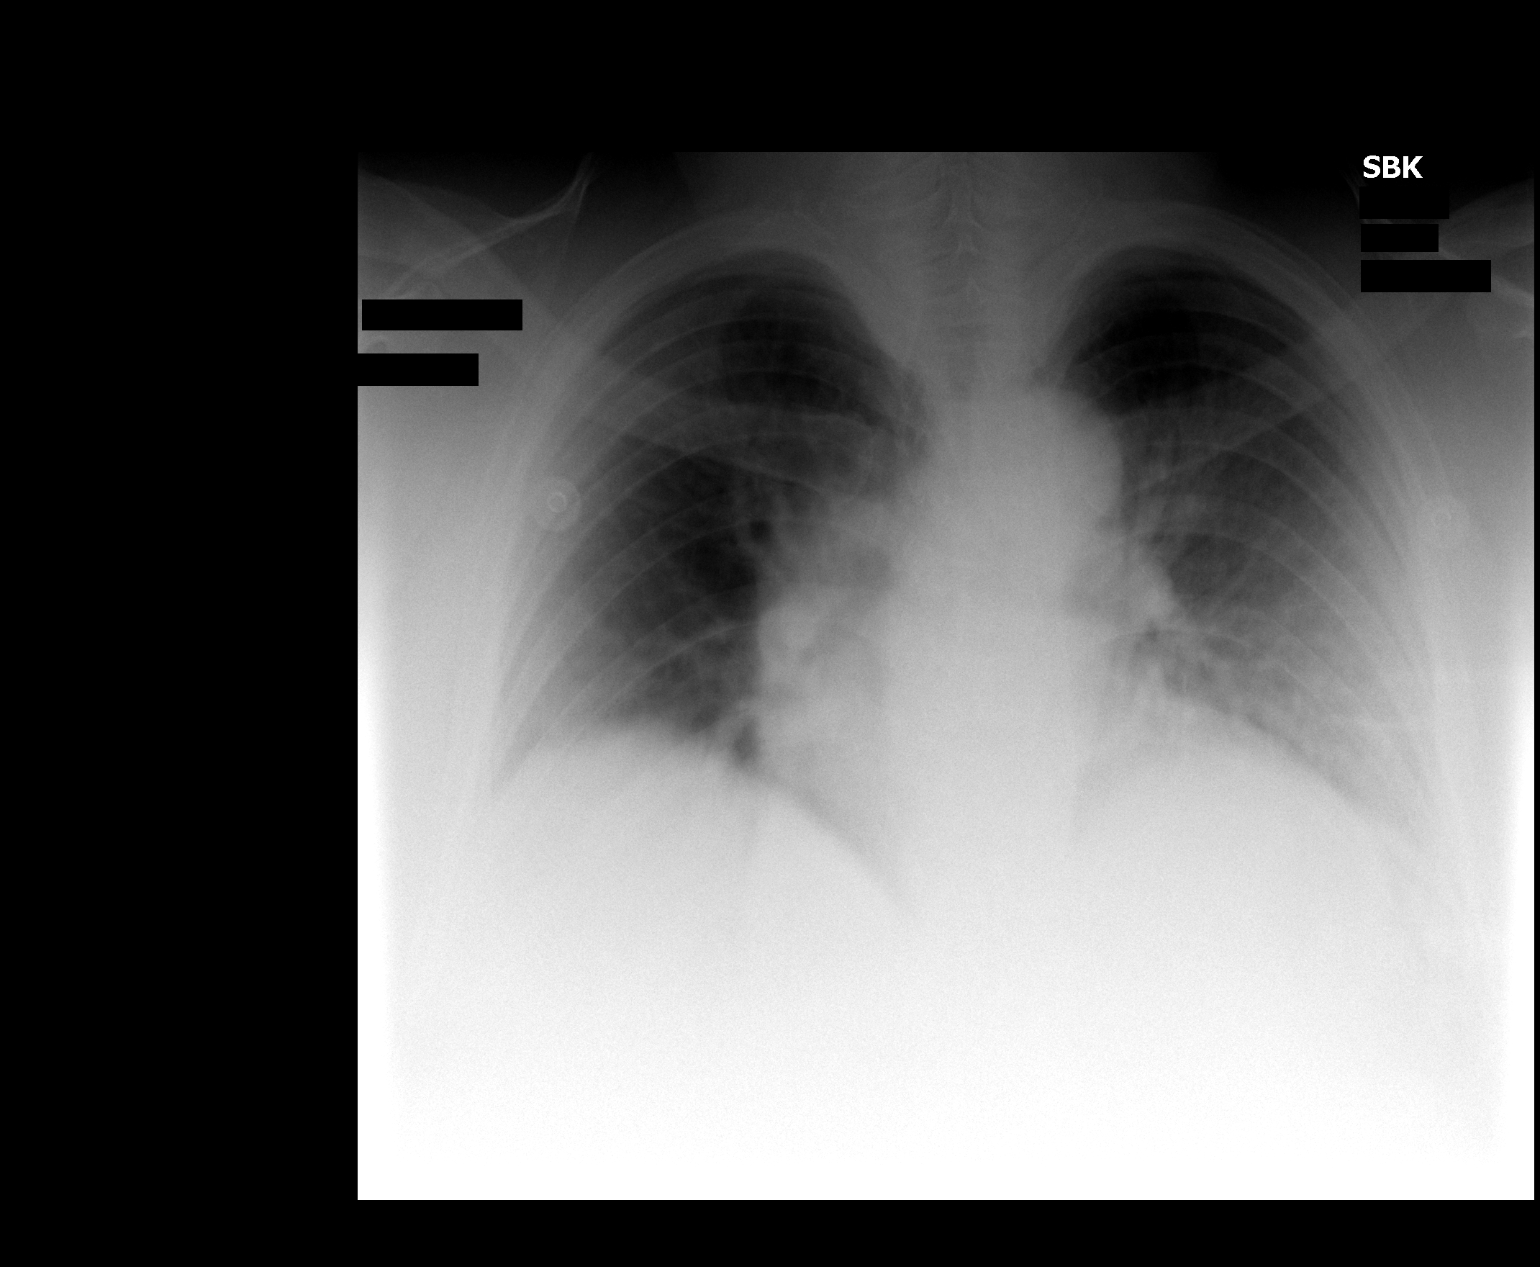

[1 of 1 positions shown; findings below may reference images not displayed]

FINDINGS: There is motion on the film in this patient who reportedly was having a seizure during the film.  There is mild cardiomegaly present.  The lungs are suboptimally aerated but no active infiltrate or effusion is seen.
IMPRESSION: 1.  There is motion on the film as described above.  Suggest repeat.
 2.  No definite active process.  Mild cardiomegaly.

## 2007-02-24 IMAGING — CR DG CHEST 2V
2 series · 2 of 2 positions shown · non-contrast
Comparison: 10/27/05.

CLINICAL DATA: 54-year-old with chest and low back pain.
 CHEST - 2 VIEW:

[view not recorded (1 of 2)]
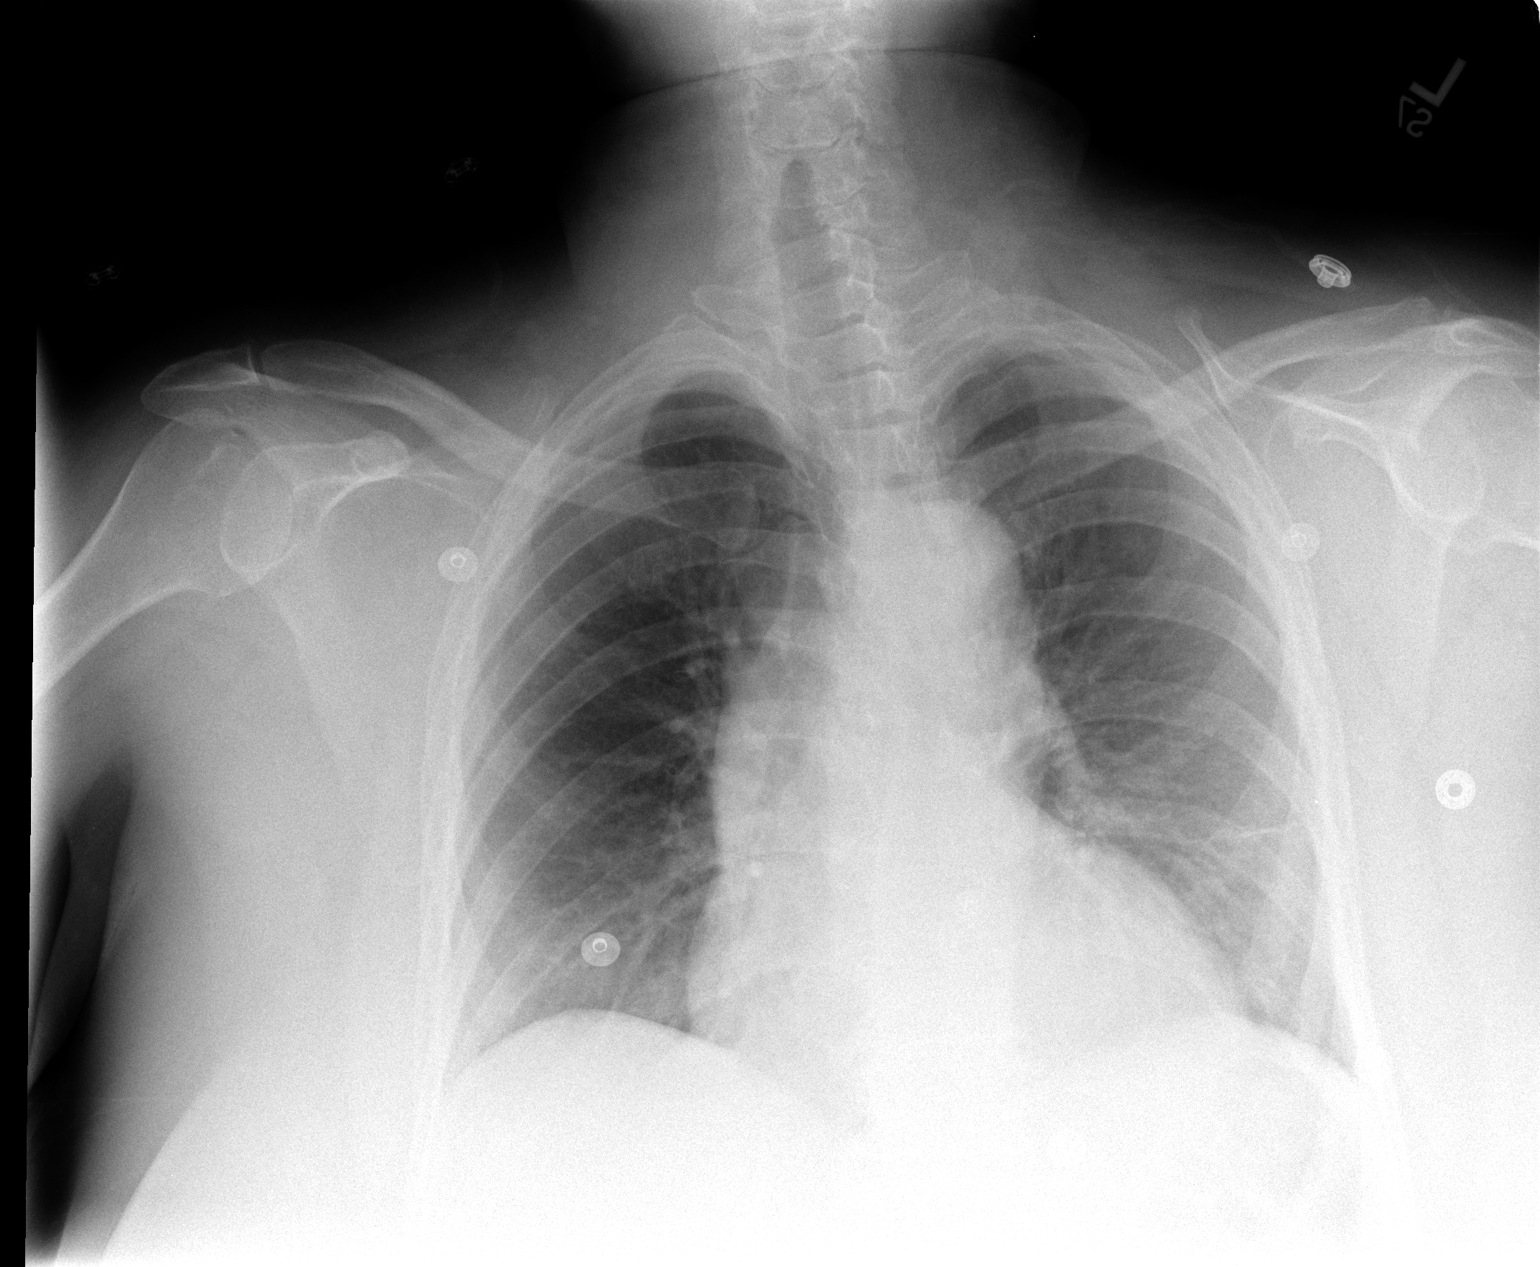

[view not recorded (2 of 2)]
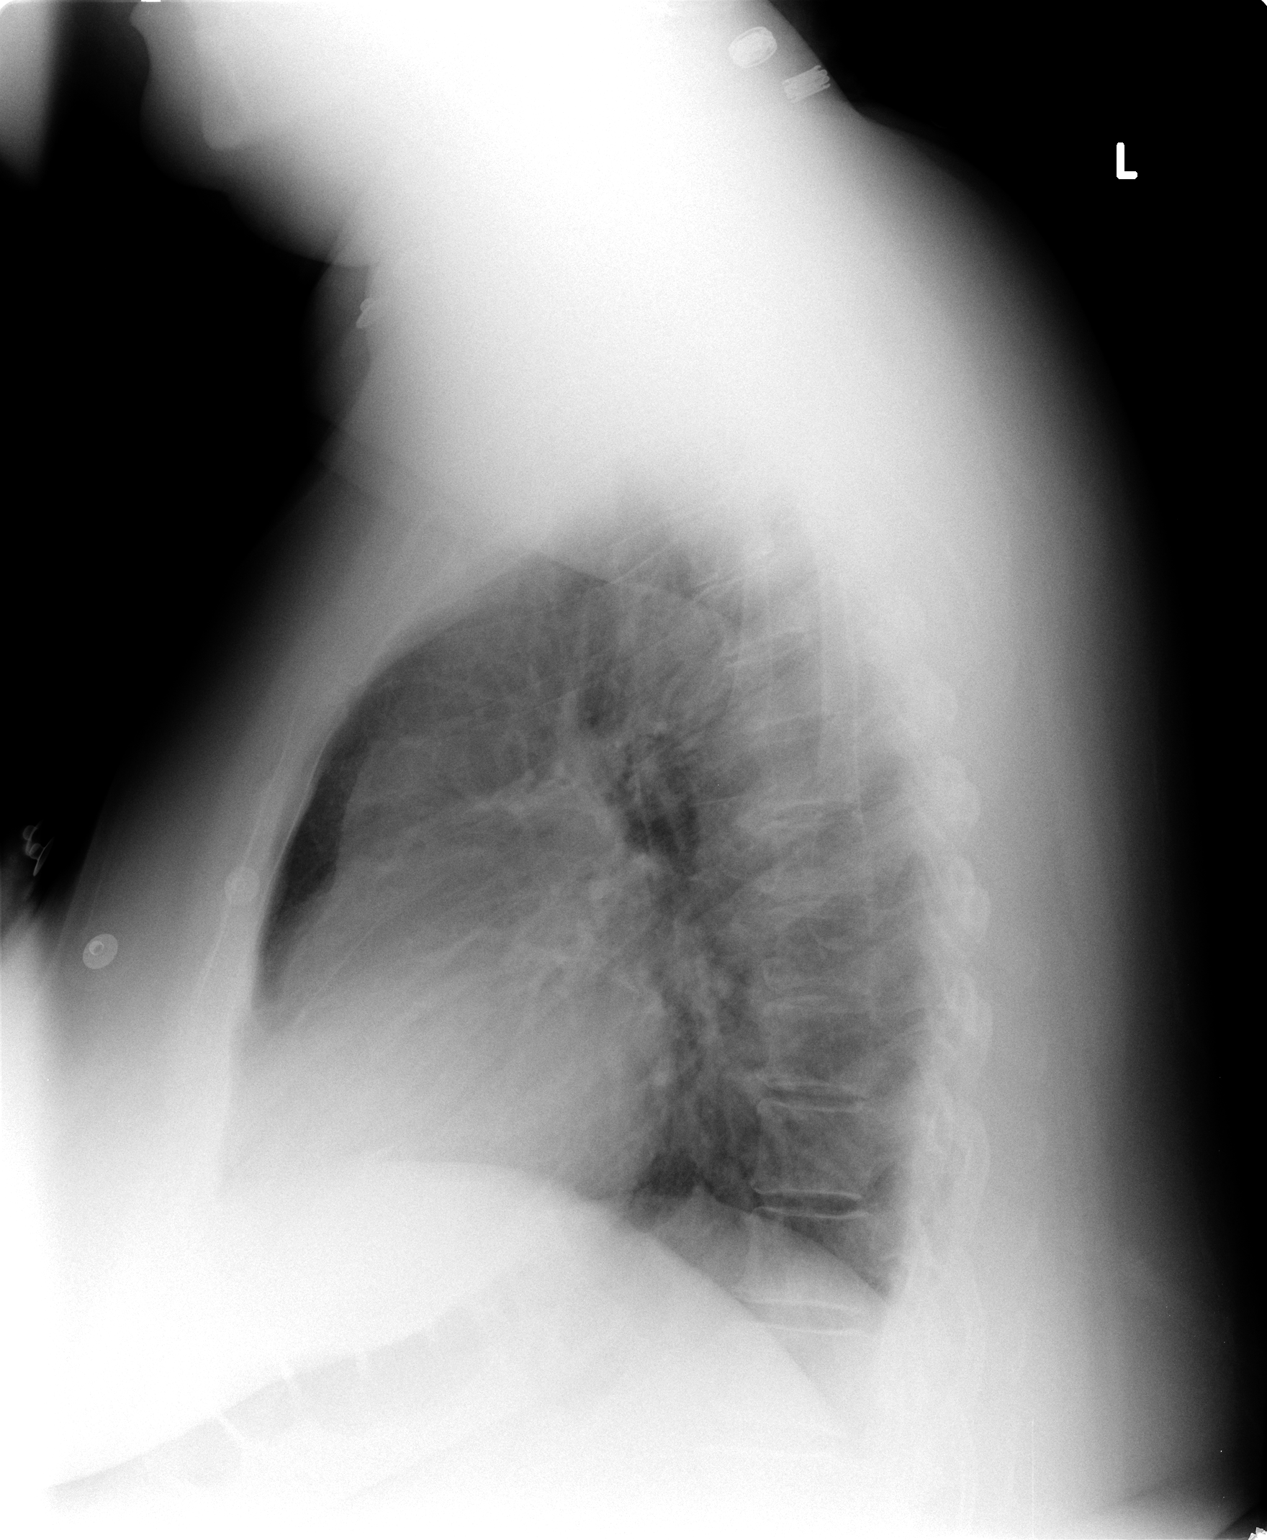

[2 of 2 positions shown; findings below may reference images not displayed]

FINDINGS: Heart is mildly enlarged.  Mild vascular congestion without overt pulmonary edema.  Streaky left basilar atelectasis.  No effusions.
IMPRESSION: Cardiac enlargement with vascular congestion but no pulmonary edema or focal pulmonary infiltrates.  No pleural effusions.

## 2007-03-05 ENCOUNTER — Emergency Department (HOSPITAL_COMMUNITY): Admission: EM | Admit: 2007-03-05 | Discharge: 2007-03-05 | Payer: Self-pay | Admitting: Emergency Medicine

## 2007-03-05 ENCOUNTER — Encounter (INDEPENDENT_AMBULATORY_CARE_PROVIDER_SITE_OTHER): Payer: Self-pay | Admitting: Family Medicine

## 2007-03-20 ENCOUNTER — Encounter: Admission: RE | Admit: 2007-03-20 | Discharge: 2007-03-20 | Payer: Self-pay | Admitting: Family Medicine

## 2007-04-01 ENCOUNTER — Emergency Department (HOSPITAL_COMMUNITY): Admission: EM | Admit: 2007-04-01 | Discharge: 2007-04-01 | Payer: Self-pay | Admitting: Emergency Medicine

## 2007-04-04 ENCOUNTER — Telehealth (INDEPENDENT_AMBULATORY_CARE_PROVIDER_SITE_OTHER): Payer: Self-pay | Admitting: *Deleted

## 2007-04-05 IMAGING — CR DG CHEST 1V PORT
1 series · 1 of 1 positions shown · non-contrast
Comparison: 10/27/05.
PORTABLE CHEST - 1 VIEW:

CLINICAL DATA: CP

[view not recorded]
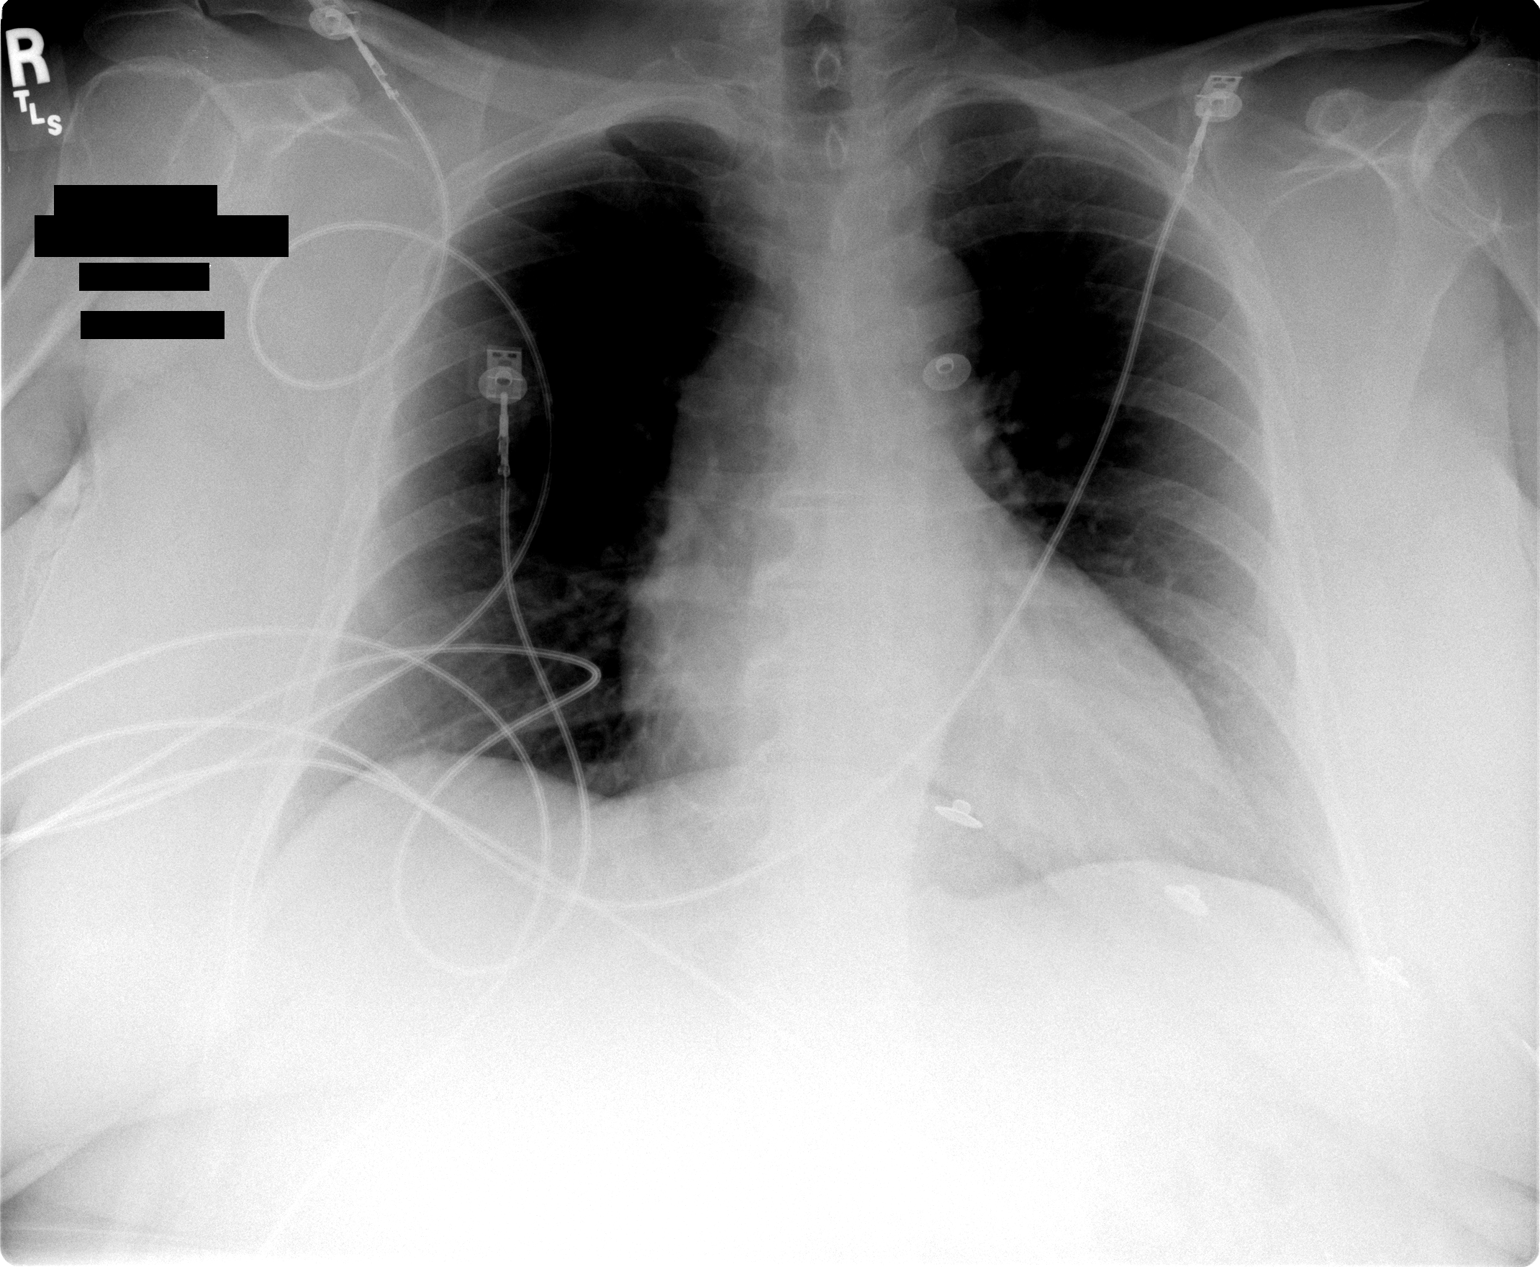

[1 of 1 positions shown; findings below may reference images not displayed]

FINDINGS: The heart size is enlarged.  There is no effusion or edema and no focal air space disease is noted.
IMPRESSION: Cardiac enlargement without edema.

## 2007-04-23 ENCOUNTER — Encounter (INDEPENDENT_AMBULATORY_CARE_PROVIDER_SITE_OTHER): Payer: Self-pay | Admitting: Family Medicine

## 2007-05-28 IMAGING — CR DG CHEST 1V PORT
1 series · 1 of 1 positions shown · non-contrast
Comparison: 12/06/2005

CLINICAL DATA: Chest pain

PORTABLE CHEST - 1 VIEW:

[view not recorded]
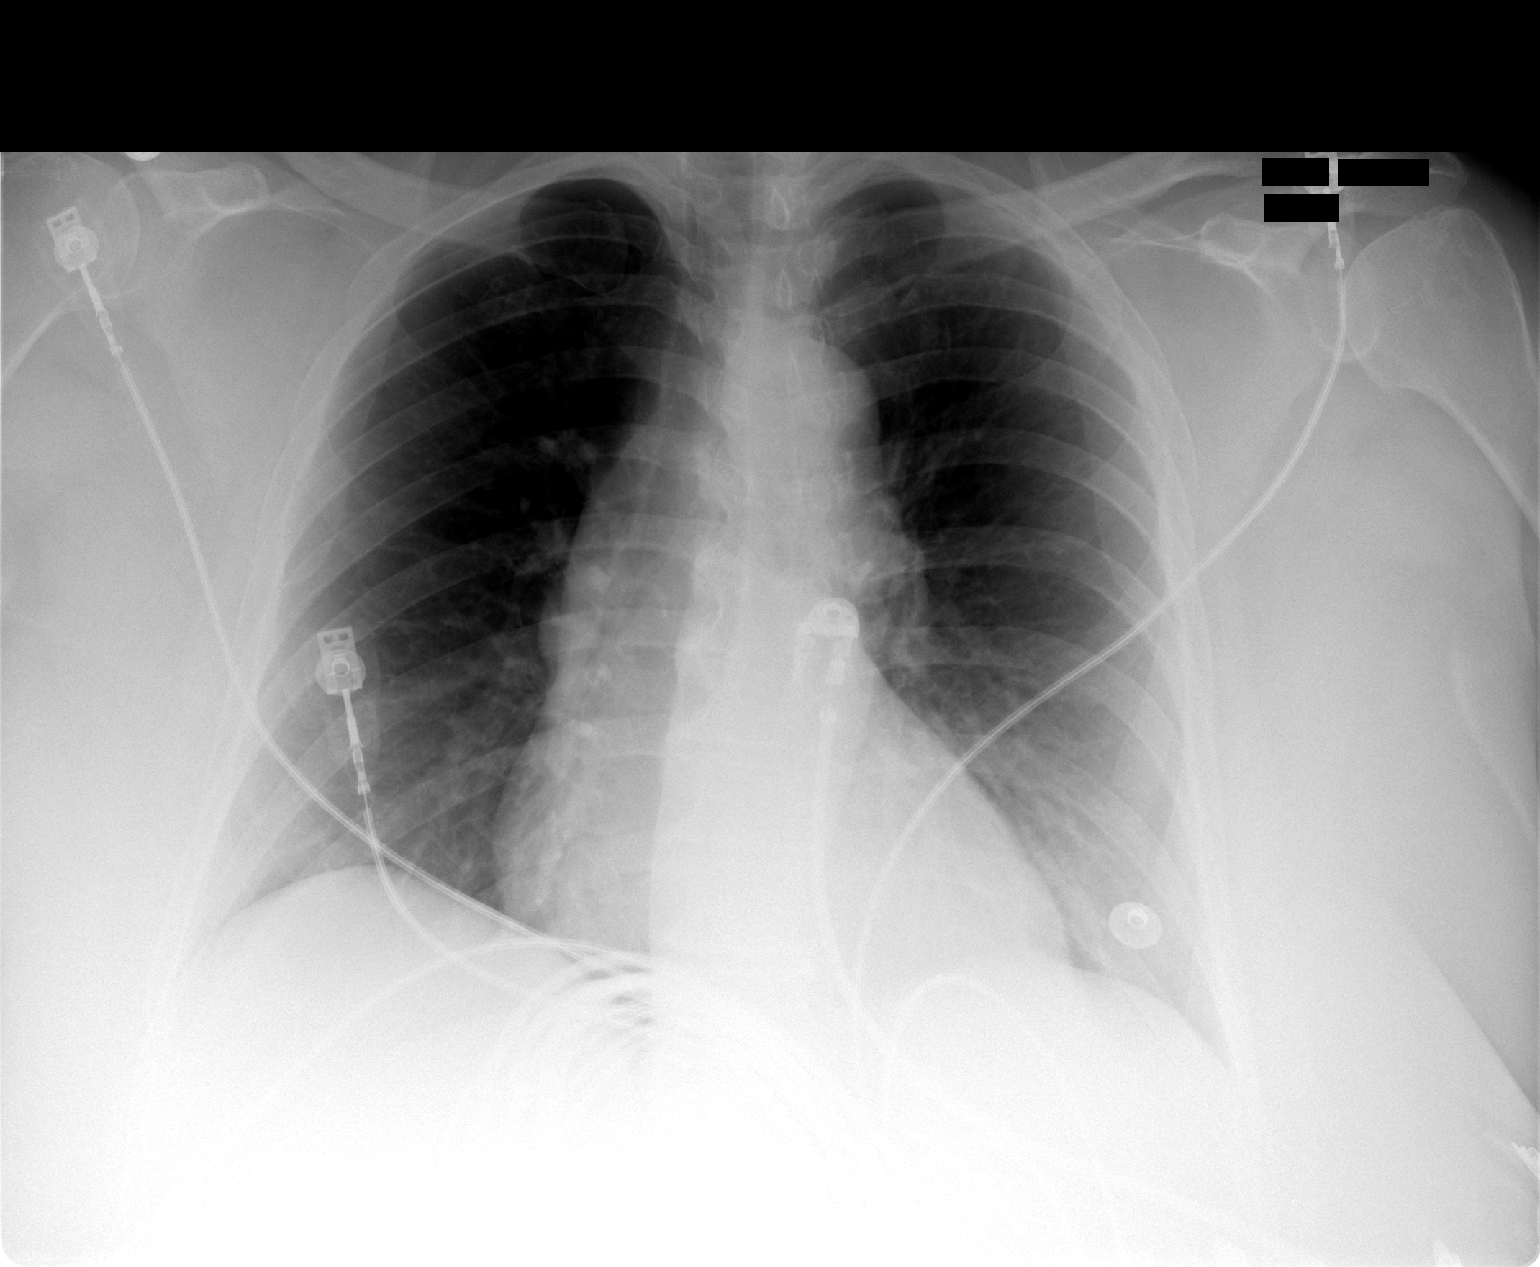

[1 of 1 positions shown; findings below may reference images not displayed]

FINDINGS: Heart is mildly enlarged. Mild tortuosity the thoracic aorta. Lungs
clear. No effusions. The visualized skeleton unremarkable.
IMPRESSION: Mild cardiomegaly, stable. No acute findings.

## 2007-05-31 ENCOUNTER — Encounter (INDEPENDENT_AMBULATORY_CARE_PROVIDER_SITE_OTHER): Payer: Self-pay | Admitting: Nurse Practitioner

## 2007-06-04 ENCOUNTER — Telehealth (INDEPENDENT_AMBULATORY_CARE_PROVIDER_SITE_OTHER): Payer: Self-pay | Admitting: Family Medicine

## 2007-06-27 ENCOUNTER — Inpatient Hospital Stay (HOSPITAL_COMMUNITY): Admission: EM | Admit: 2007-06-27 | Discharge: 2007-06-29 | Payer: Self-pay | Admitting: Emergency Medicine

## 2007-07-17 ENCOUNTER — Telehealth (INDEPENDENT_AMBULATORY_CARE_PROVIDER_SITE_OTHER): Payer: Self-pay | Admitting: Family Medicine

## 2007-07-27 IMAGING — CR DG CHEST 1V PORT
1 series · 1 of 1 positions shown · non-contrast
Comparison: 01/28/2006

CLINICAL DATA: Chest pain

[view not recorded]
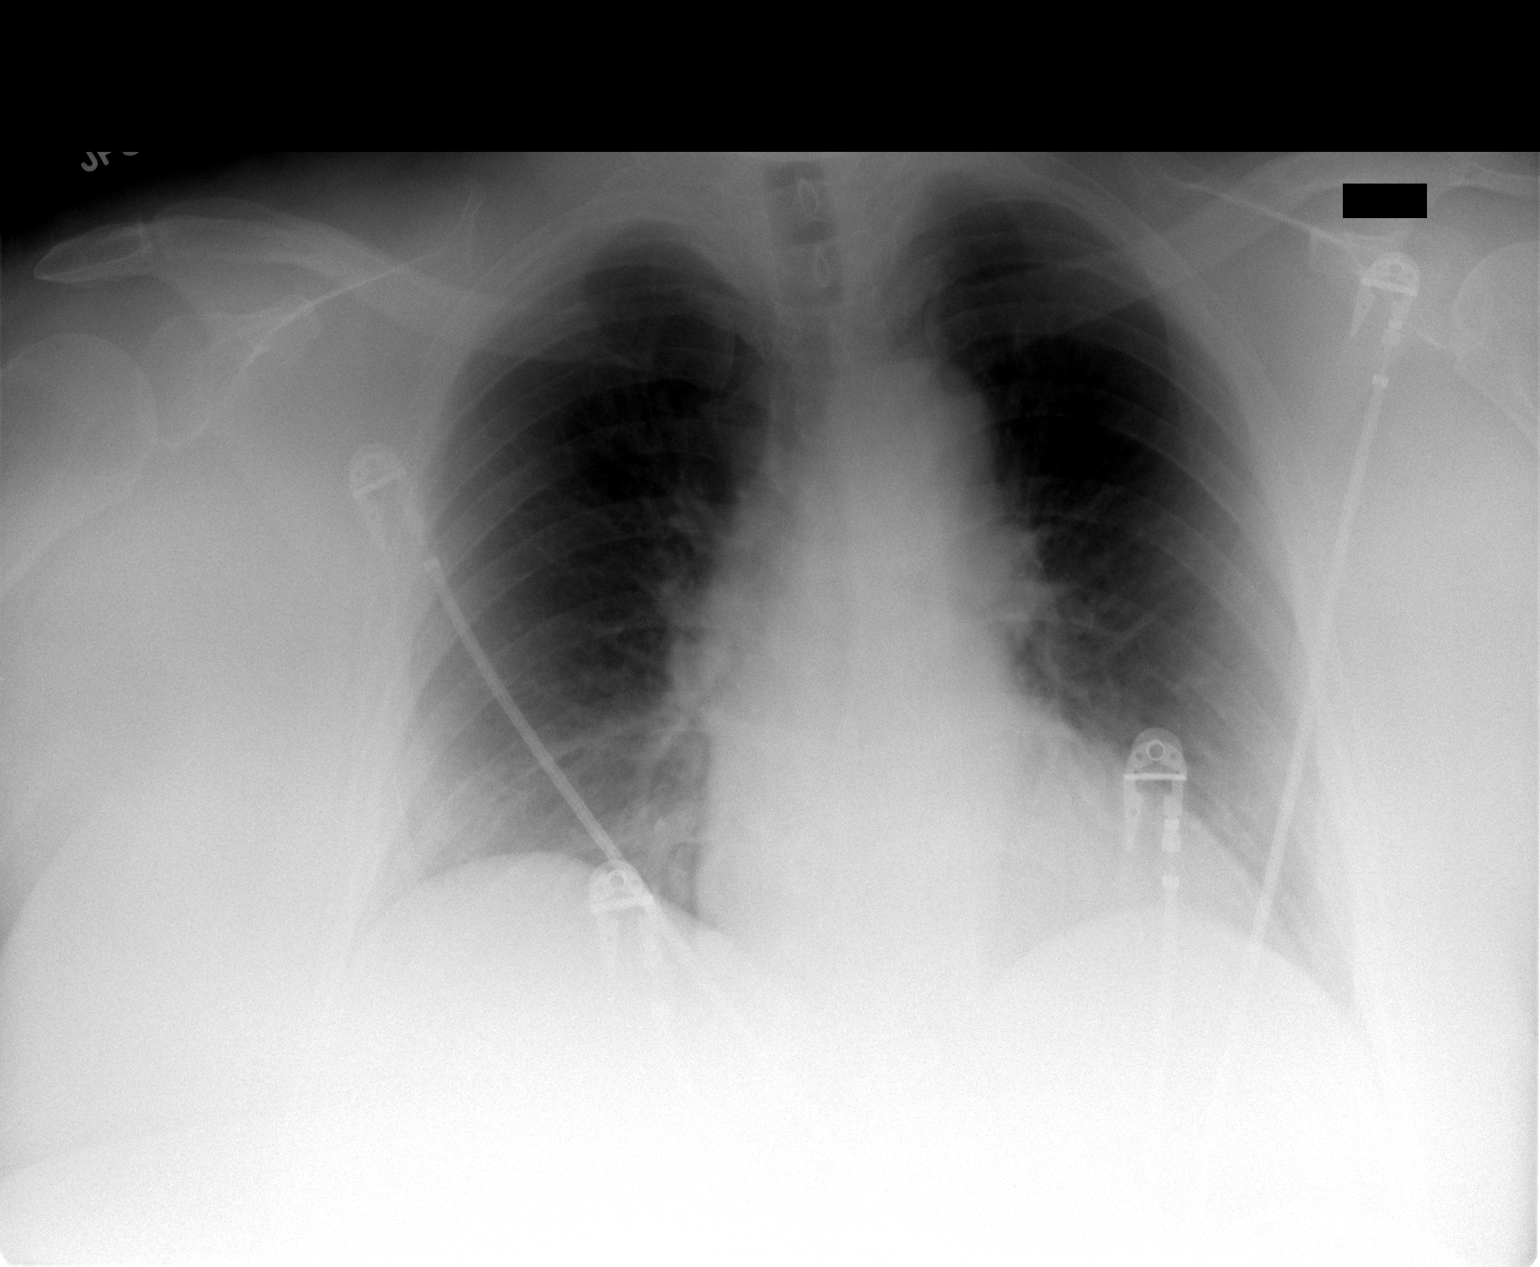

[1 of 1 positions shown; findings below may reference images not displayed]

PORTABLE CHEST - 1 VIEW:

6353 hours. Low lung volumes with cardiomegaly. No overt pulmonary edema or
focal infiltrate. Telemetry leads overlie the chest.
IMPRESSION: Stable exam. Cardiomegaly without overt edema or focal infiltrate.

## 2007-07-31 ENCOUNTER — Emergency Department (HOSPITAL_COMMUNITY): Admission: EM | Admit: 2007-07-31 | Discharge: 2007-07-31 | Payer: Self-pay | Admitting: Emergency Medicine

## 2007-07-31 ENCOUNTER — Telehealth (INDEPENDENT_AMBULATORY_CARE_PROVIDER_SITE_OTHER): Payer: Self-pay | Admitting: *Deleted

## 2007-08-08 ENCOUNTER — Emergency Department (HOSPITAL_COMMUNITY): Admission: EM | Admit: 2007-08-08 | Discharge: 2007-08-08 | Payer: Self-pay | Admitting: Emergency Medicine

## 2007-08-13 ENCOUNTER — Ambulatory Visit: Payer: Self-pay | Admitting: Family Medicine

## 2007-09-07 ENCOUNTER — Encounter (INDEPENDENT_AMBULATORY_CARE_PROVIDER_SITE_OTHER): Payer: Self-pay | Admitting: Family Medicine

## 2007-09-17 ENCOUNTER — Encounter (INDEPENDENT_AMBULATORY_CARE_PROVIDER_SITE_OTHER): Payer: Self-pay | Admitting: Family Medicine

## 2007-09-26 IMAGING — CR DG CHEST 2V
2 series · 2 of 2 positions shown · non-contrast
Comparison: 03/29/06.

CLINICAL DATA: Chest pain.
 CHEST - 2 VIEW:

[view not recorded (1 of 2)]
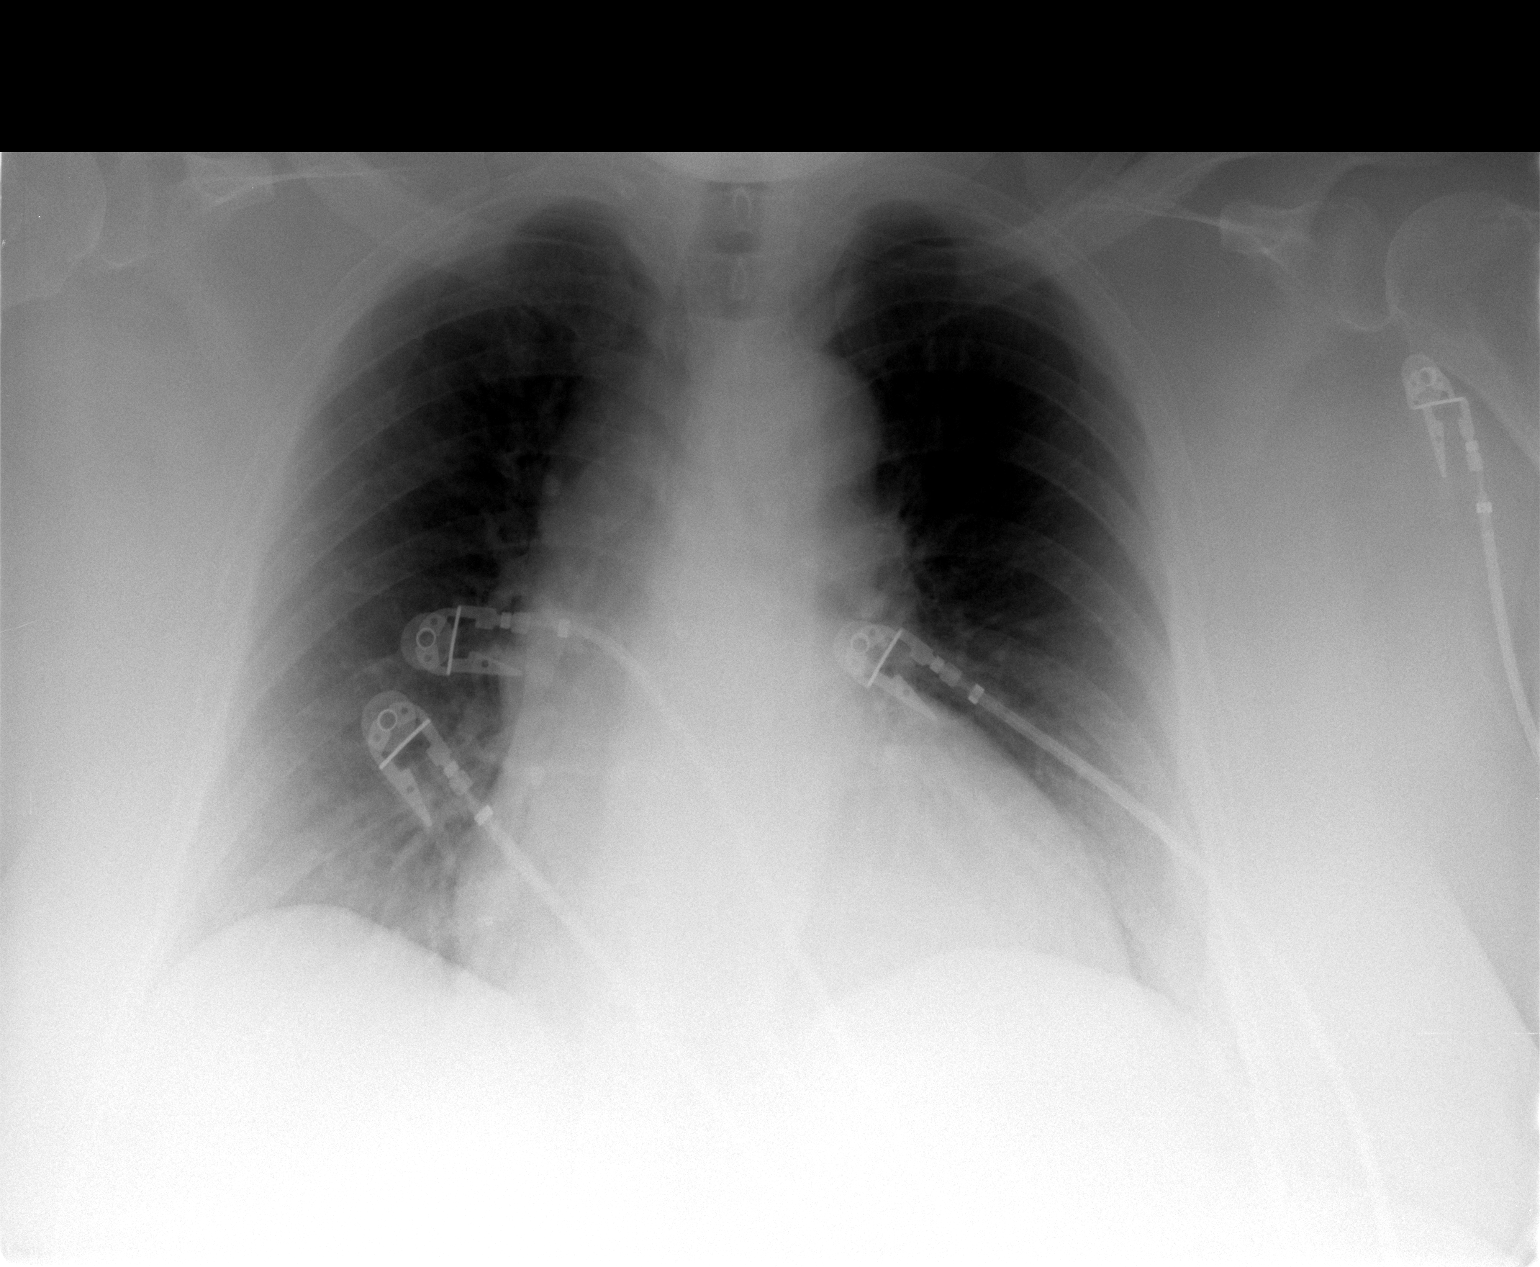

[view not recorded (2 of 2)]
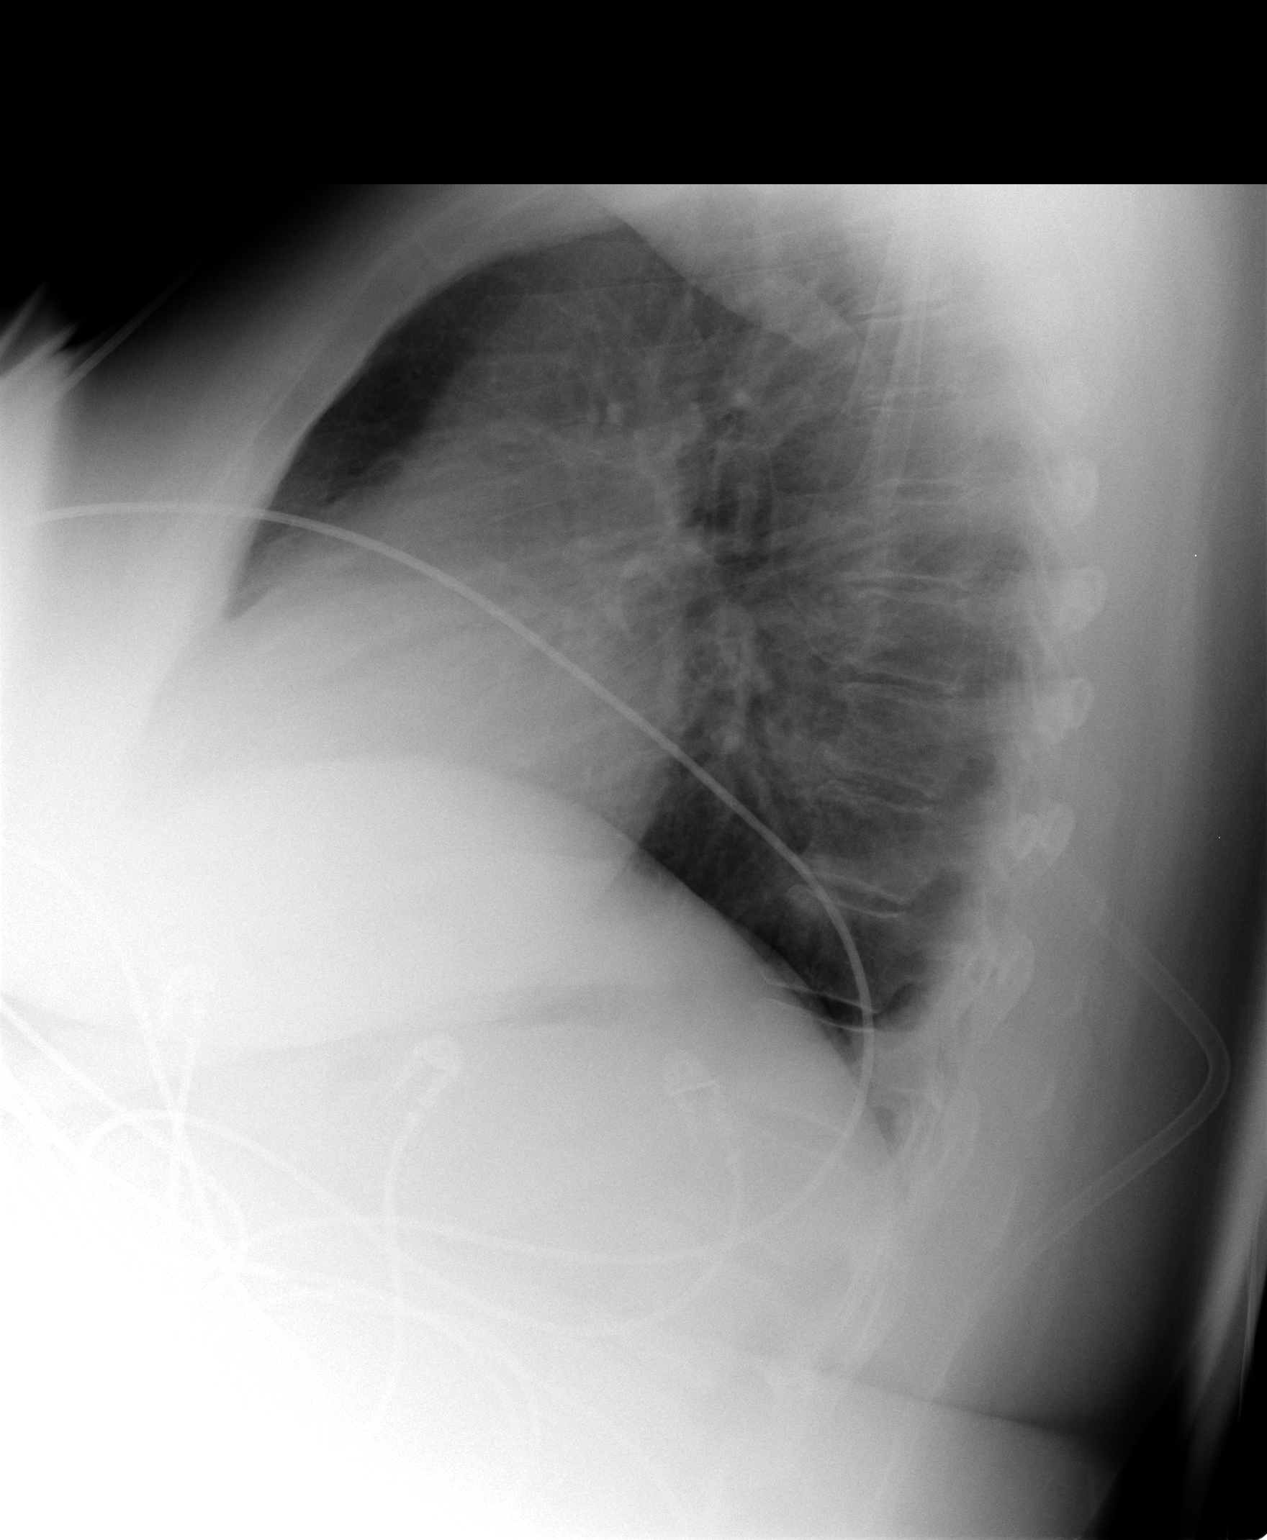

[2 of 2 positions shown; findings below may reference images not displayed]

FINDINGS: Heart size is enlarged.  There are no effusions or edema.  No airspace opacities.
IMPRESSION: Cardiac enlargement without overt edema or focal infiltrate.

## 2007-09-28 ENCOUNTER — Encounter: Admission: RE | Admit: 2007-09-28 | Discharge: 2007-09-28 | Payer: Self-pay | Admitting: Gastroenterology

## 2007-10-01 ENCOUNTER — Telehealth (INDEPENDENT_AMBULATORY_CARE_PROVIDER_SITE_OTHER): Payer: Self-pay | Admitting: Family Medicine

## 2007-10-06 ENCOUNTER — Encounter (INDEPENDENT_AMBULATORY_CARE_PROVIDER_SITE_OTHER): Payer: Self-pay | Admitting: Family Medicine

## 2007-10-10 ENCOUNTER — Emergency Department (HOSPITAL_COMMUNITY): Admission: EM | Admit: 2007-10-10 | Discharge: 2007-10-10 | Payer: Self-pay | Admitting: Emergency Medicine

## 2007-10-21 ENCOUNTER — Emergency Department (HOSPITAL_COMMUNITY): Admission: EM | Admit: 2007-10-21 | Discharge: 2007-10-21 | Payer: Self-pay | Admitting: Emergency Medicine

## 2007-10-28 ENCOUNTER — Encounter (INDEPENDENT_AMBULATORY_CARE_PROVIDER_SITE_OTHER): Payer: Self-pay | Admitting: Family Medicine

## 2007-10-31 ENCOUNTER — Telehealth (INDEPENDENT_AMBULATORY_CARE_PROVIDER_SITE_OTHER): Payer: Self-pay | Admitting: *Deleted

## 2007-10-31 ENCOUNTER — Ambulatory Visit: Payer: Self-pay | Admitting: Family Medicine

## 2007-11-06 ENCOUNTER — Ambulatory Visit (HOSPITAL_COMMUNITY): Admission: RE | Admit: 2007-11-06 | Discharge: 2007-11-06 | Payer: Self-pay | Admitting: Hematology and Oncology

## 2007-11-06 ENCOUNTER — Encounter (INDEPENDENT_AMBULATORY_CARE_PROVIDER_SITE_OTHER): Payer: Self-pay | Admitting: Family Medicine

## 2007-11-08 ENCOUNTER — Telehealth (INDEPENDENT_AMBULATORY_CARE_PROVIDER_SITE_OTHER): Payer: Self-pay | Admitting: *Deleted

## 2007-11-15 ENCOUNTER — Telehealth (INDEPENDENT_AMBULATORY_CARE_PROVIDER_SITE_OTHER): Payer: Self-pay | Admitting: *Deleted

## 2007-11-25 ENCOUNTER — Encounter (INDEPENDENT_AMBULATORY_CARE_PROVIDER_SITE_OTHER): Payer: Self-pay | Admitting: Family Medicine

## 2007-12-11 ENCOUNTER — Ambulatory Visit: Payer: Self-pay | Admitting: Family Medicine

## 2007-12-11 DIAGNOSIS — M545 Low back pain: Secondary | ICD-10-CM

## 2007-12-12 ENCOUNTER — Encounter (INDEPENDENT_AMBULATORY_CARE_PROVIDER_SITE_OTHER): Payer: Self-pay | Admitting: Family Medicine

## 2007-12-25 ENCOUNTER — Encounter (INDEPENDENT_AMBULATORY_CARE_PROVIDER_SITE_OTHER): Payer: Self-pay | Admitting: Family Medicine

## 2007-12-27 ENCOUNTER — Encounter (INDEPENDENT_AMBULATORY_CARE_PROVIDER_SITE_OTHER): Payer: Self-pay | Admitting: Family Medicine

## 2008-01-05 IMAGING — CR DG CHEST 1V PORT
1 series · 1 of 1 positions shown · non-contrast
Comparison: 05/29/06.

CLINICAL DATA: 55-year-old with chest pain and shortness of breath.
 PORTABLE CHEST - 1 VIEW 09/07/06:

[view not recorded]
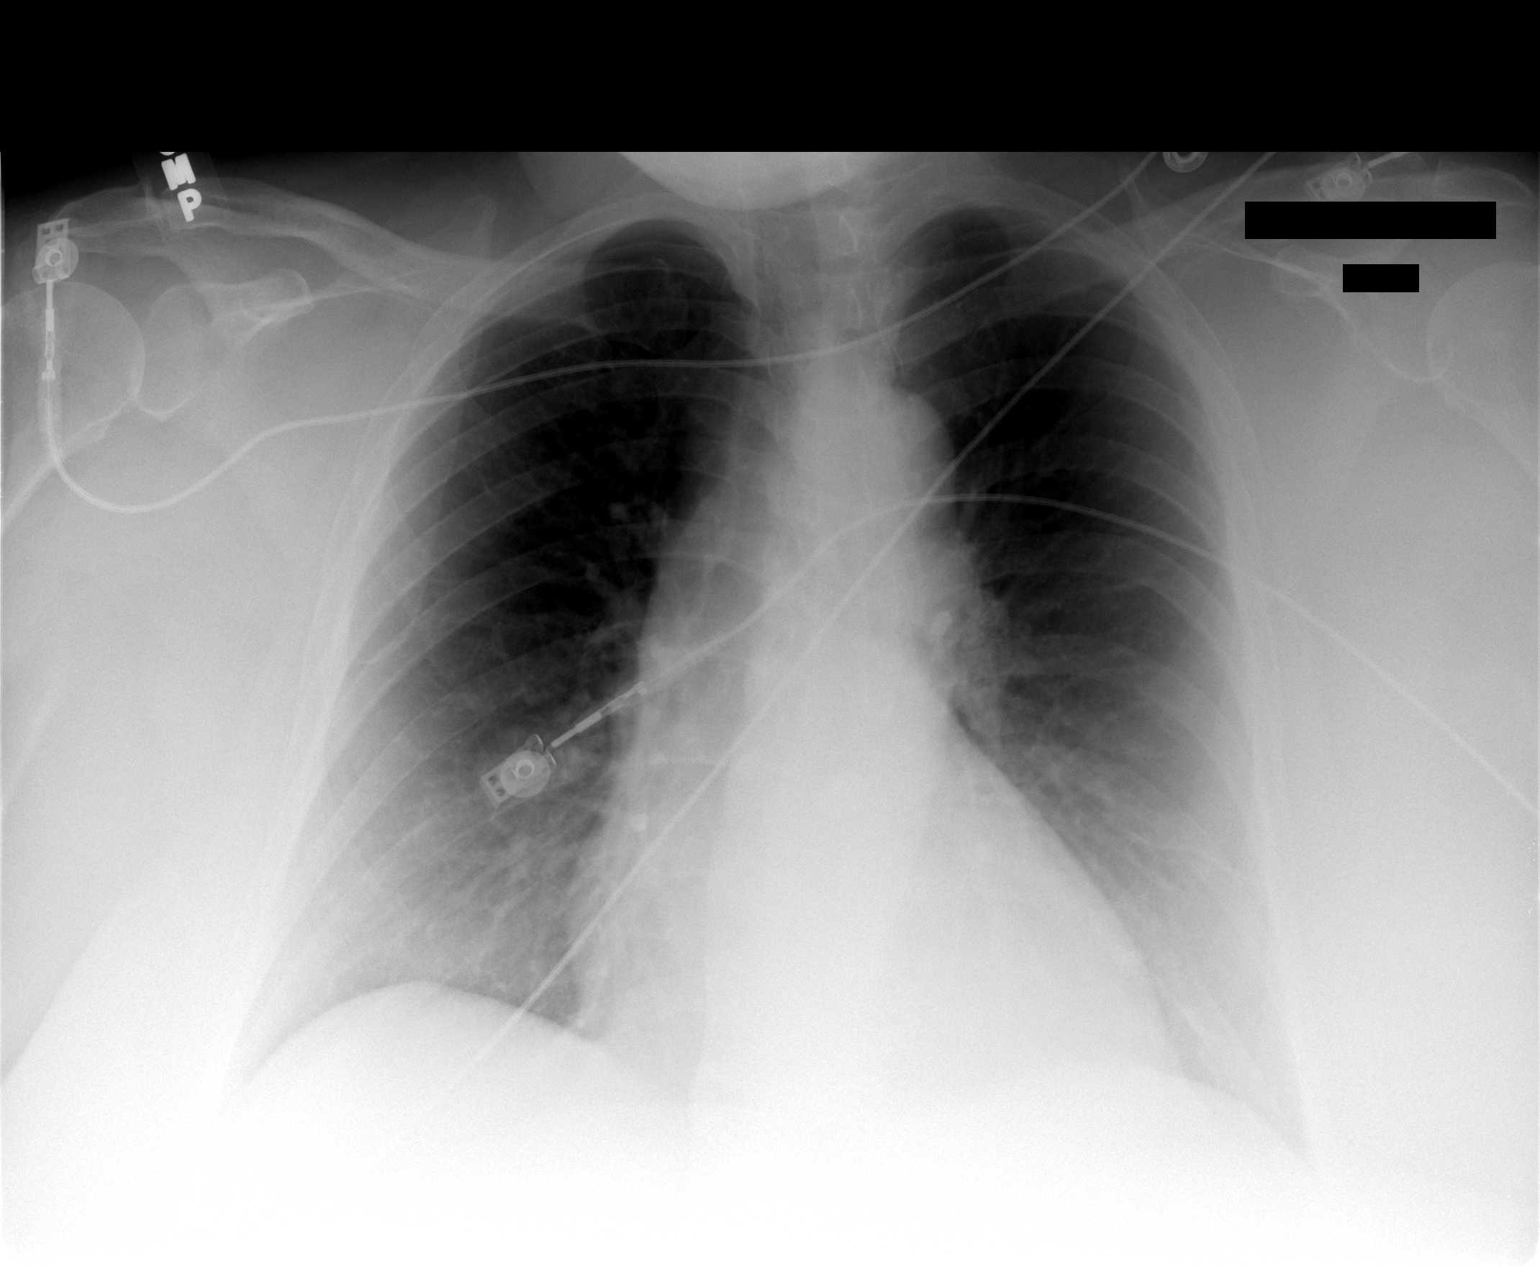

[1 of 1 positions shown; findings below may reference images not displayed]

FINDINGS: The heart is borderline in size.  Mediastinal contours are stable.  No acute pulmonary findings.
IMPRESSION: No acute cardiopulmonary findings.

## 2008-01-22 ENCOUNTER — Encounter (INDEPENDENT_AMBULATORY_CARE_PROVIDER_SITE_OTHER): Payer: Self-pay | Admitting: Nurse Practitioner

## 2008-01-27 ENCOUNTER — Emergency Department (HOSPITAL_COMMUNITY): Admission: EM | Admit: 2008-01-27 | Discharge: 2008-01-27 | Payer: Self-pay | Admitting: Emergency Medicine

## 2008-02-05 IMAGING — CR DG CHEST 2V
1 series · 1 of 1 positions shown · non-contrast
Comparison: 09/07/2006

Exam: Chest, 2 views

HISTORY: Shortness of breath and chest pain.

[view not recorded]
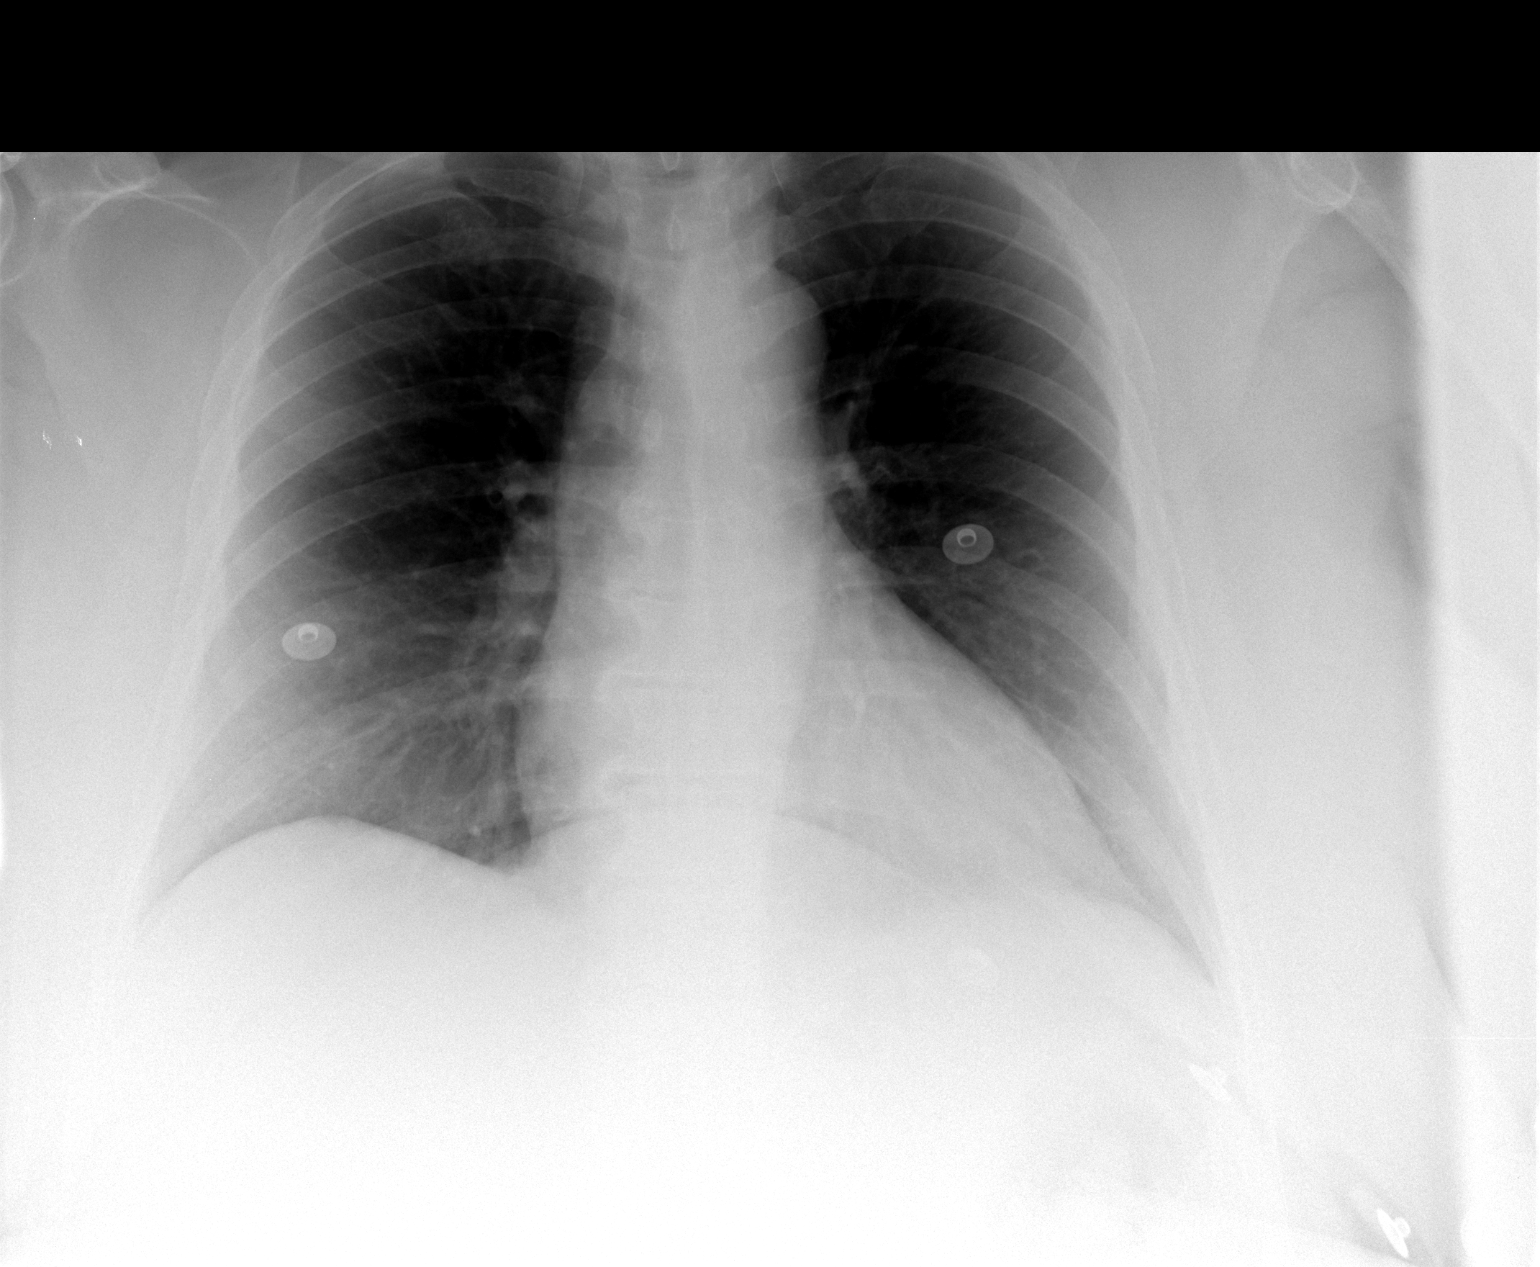

[1 of 1 positions shown; findings below may reference images not displayed]

FINDINGS: Heart size is normal.

No effusions or edema.

No airspace opacities are identified.
IMPRESSION: 1. No acute findings

## 2008-02-05 IMAGING — CT CT HEAD W/O CM
1 series · 16 of 30 positions shown, 20 images · IV contrast (agent unspecified)
Comparison: none

CLINICAL DATA: Dizzy.
 HEAD CT WITHOUT CONTRAST:
TECHNIQUE: Contiguous axial images were obtained from the base of the skull through the vertex according to standard protocol without contrast.

[Series 2: head routine 4.8 h47s · axial · 0.45mm/px · z∈[-124,+11]mm · 16 of 30 slices shown, 20 images]
[im 2/30  brain]
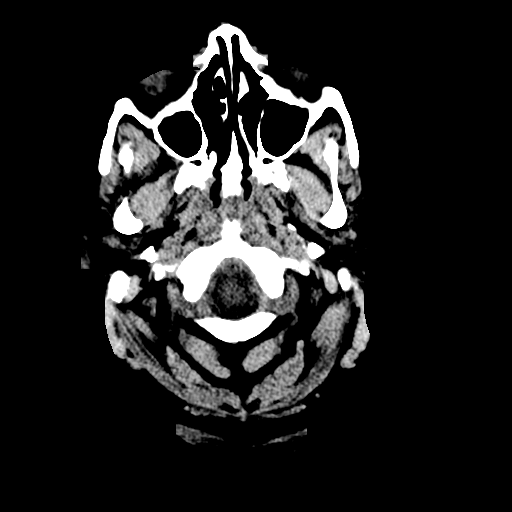
[im 2/30  bone]
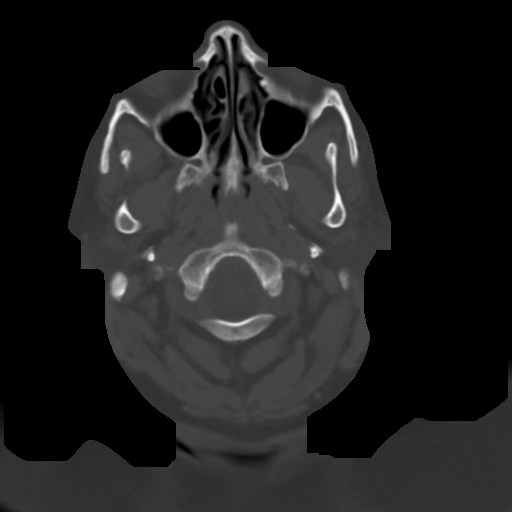
[im 4/30  brain]
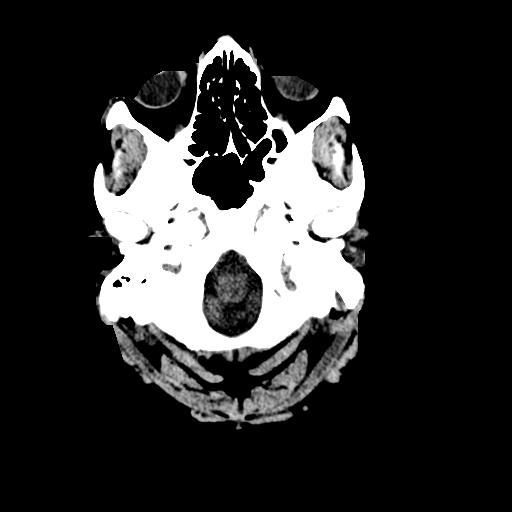
[im 6/30  brain]
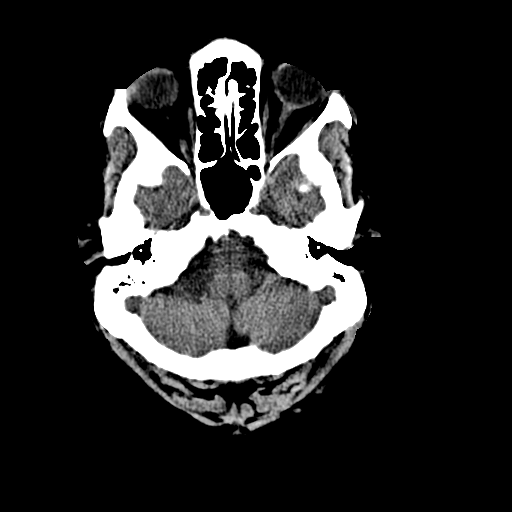
[im 8/30  brain]
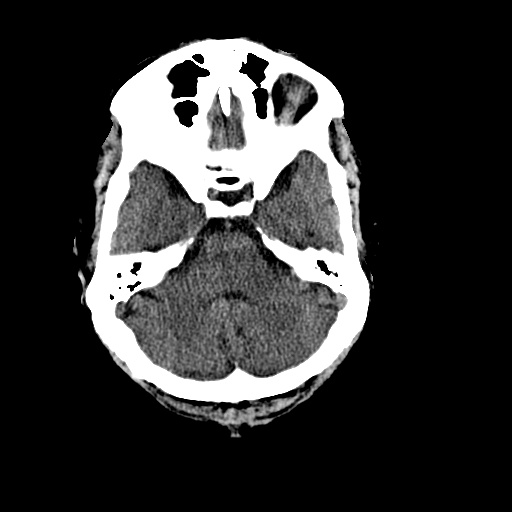
[im 9/30  brain]
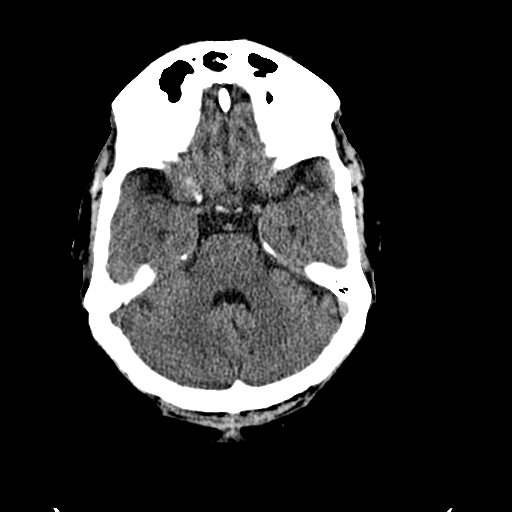
[im 9/30  bone]
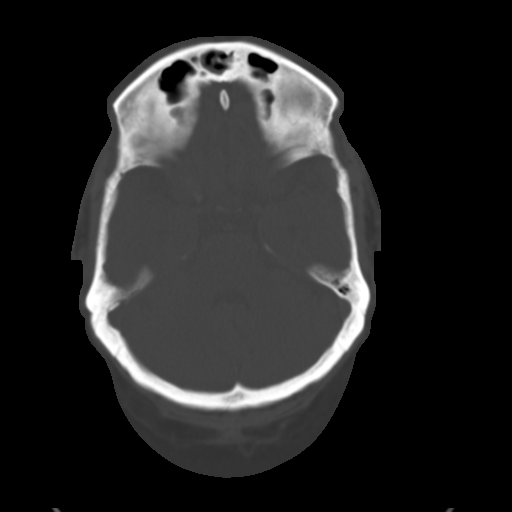
[im 11/30  brain]
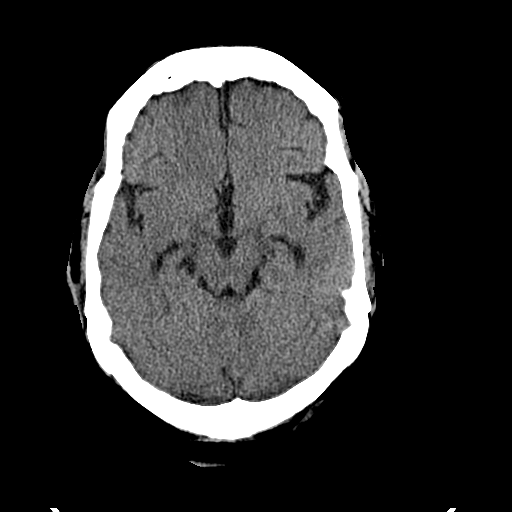
[im 13/30  brain]
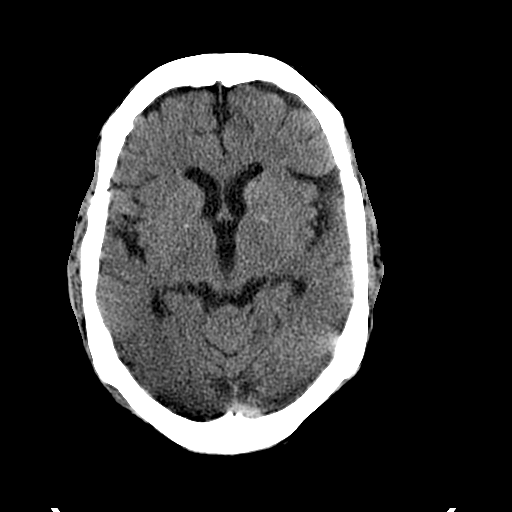
[im 15/30  brain]
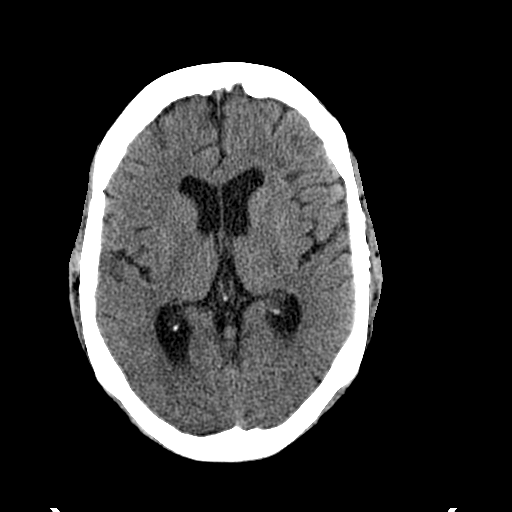
[im 16/30  brain]
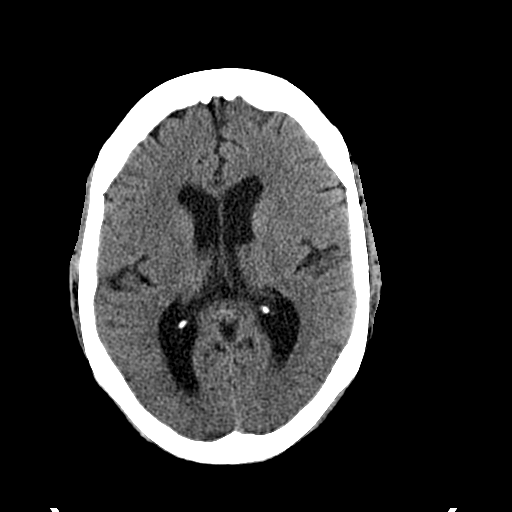
[im 16/30  bone]
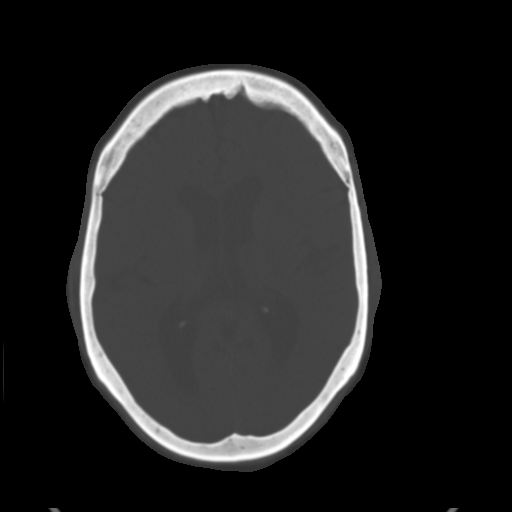
[im 18/30  brain]
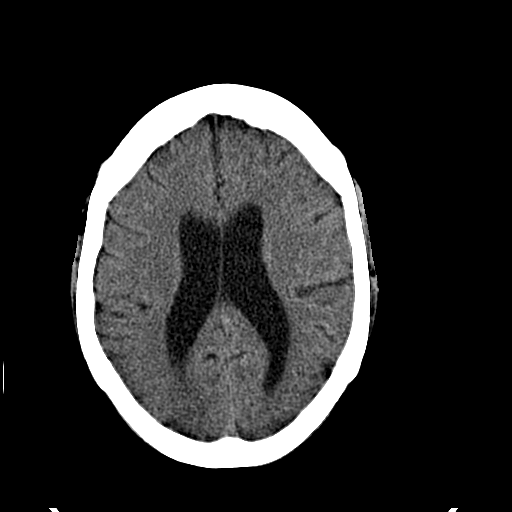
[im 20/30  brain]
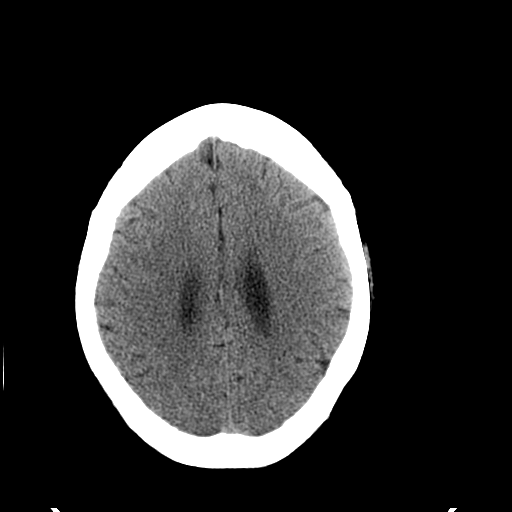
[im 22/30  brain]
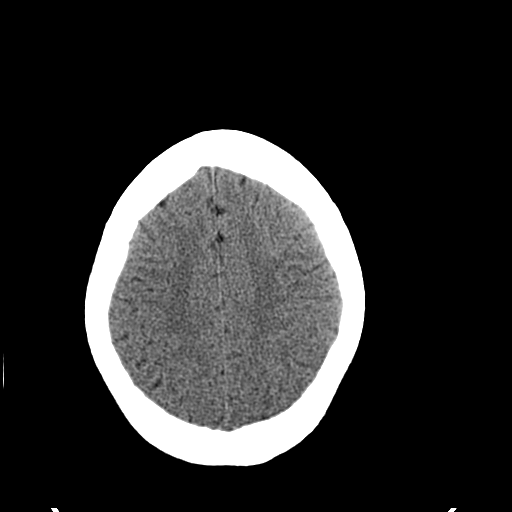
[im 23/30  brain]
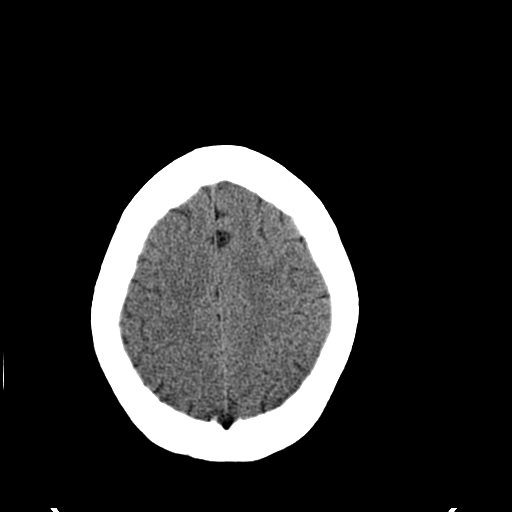
[im 23/30  bone]
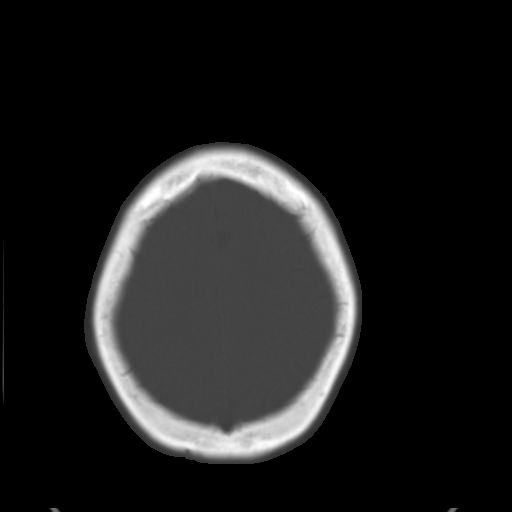
[im 25/30  brain]
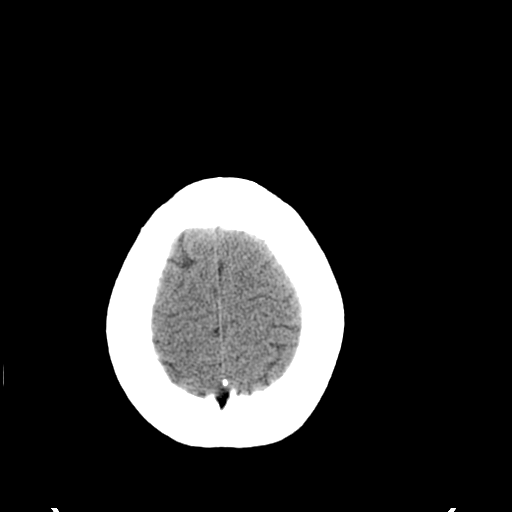
[im 27/30  brain]
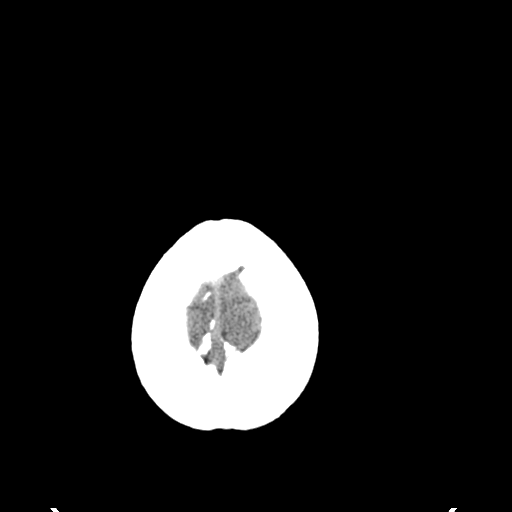
[im 29/30  brain]
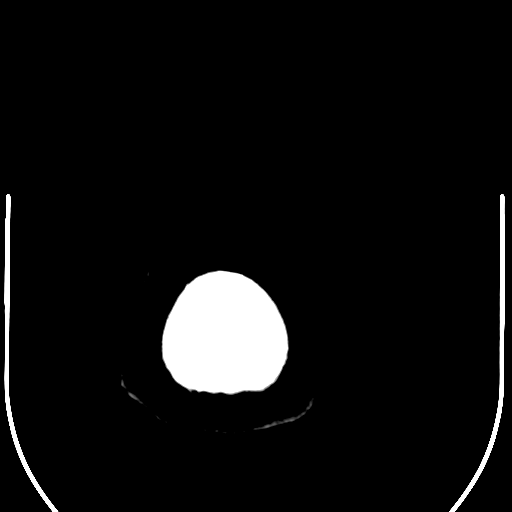

[16 of 30 positions shown; findings below may reference images not displayed]

FINDINGS: There is no evidence of intracranial hemorrhage, brain edema, acute 
 infarct, mass lesion, or mass effect.  No other intra-axial abnormalities 
 are seen, and the ventricles are within normal limits.  No abnormal 
 extra-axial fluid collections or masses are identified.  No skull 
 abnormalities are noted.
IMPRESSION: Negative non-contrast head CT.

## 2008-03-02 ENCOUNTER — Emergency Department (HOSPITAL_COMMUNITY): Admission: EM | Admit: 2008-03-02 | Discharge: 2008-03-02 | Payer: Self-pay | Admitting: Emergency Medicine

## 2008-03-12 ENCOUNTER — Ambulatory Visit: Payer: Self-pay | Admitting: Family Medicine

## 2008-03-14 ENCOUNTER — Encounter (INDEPENDENT_AMBULATORY_CARE_PROVIDER_SITE_OTHER): Payer: Self-pay | Admitting: Family Medicine

## 2008-03-28 ENCOUNTER — Emergency Department (HOSPITAL_COMMUNITY): Admission: EM | Admit: 2008-03-28 | Discharge: 2008-03-28 | Payer: Self-pay | Admitting: Emergency Medicine

## 2008-03-28 IMAGING — CT CT ABDOMEN W/ CM
3 of 5 series · 14 of 32 positions shown, 19 images · IV contrast (omnipaque)
Comparison: None

ABDOMEN CT WITH CONTRAST

CLINICAL DATA: Abdominal pain and rectal bleeding
TECHNIQUE: Multidetector CT imaging of the abdomen and pelvis was performed
following the standard protocol during bolus administration of intravenous
contrast.

Contrast:  100 cc Omnipaque 300

[Series 2: routine abdomen · axial · 0.79mm/px · z∈[-430,-120]mm · 4 of 104 slices shown, 9 images]
[im 21/104  soft-tissue]
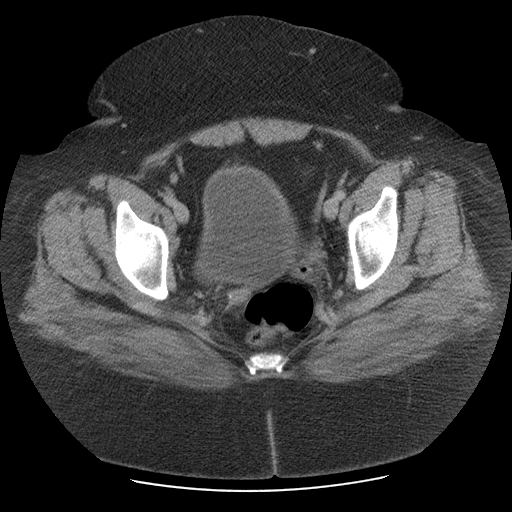
[im 21/104  lung]
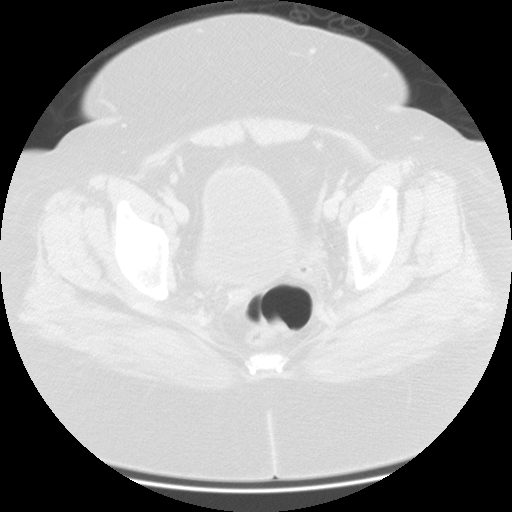
[im 21/104  bone]
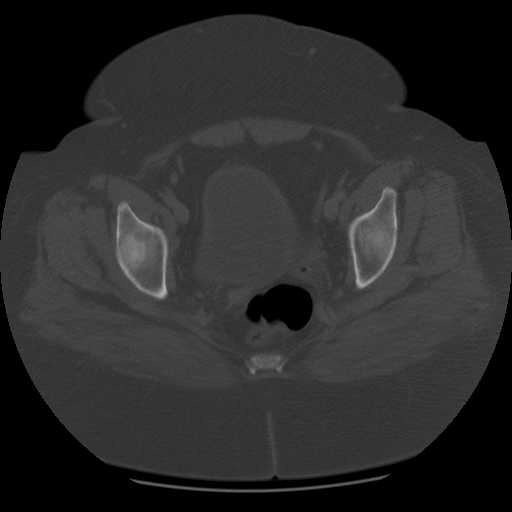
[im 42/104  soft-tissue]
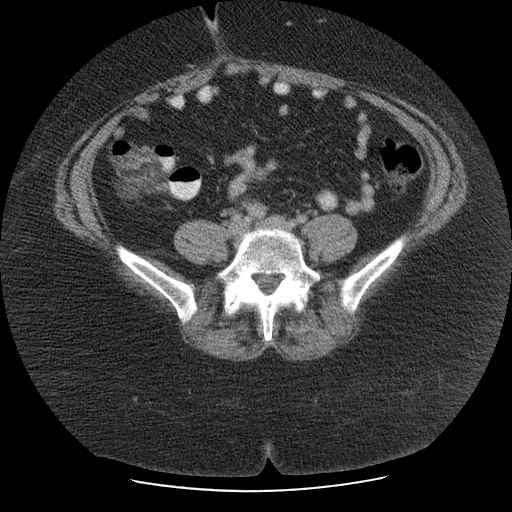
[im 42/104  lung]
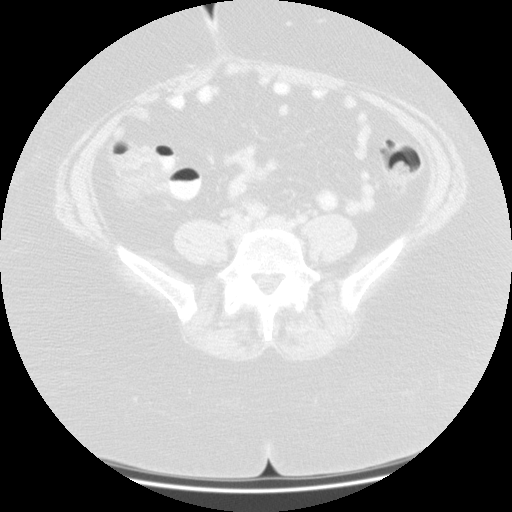
[im 62/104  soft-tissue]
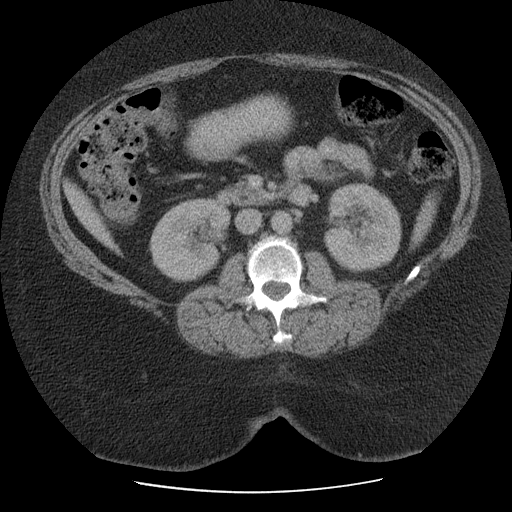
[im 62/104  lung]
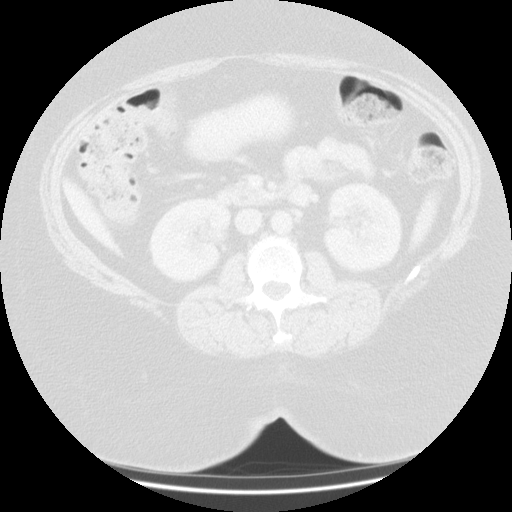
[im 83/104  soft-tissue]
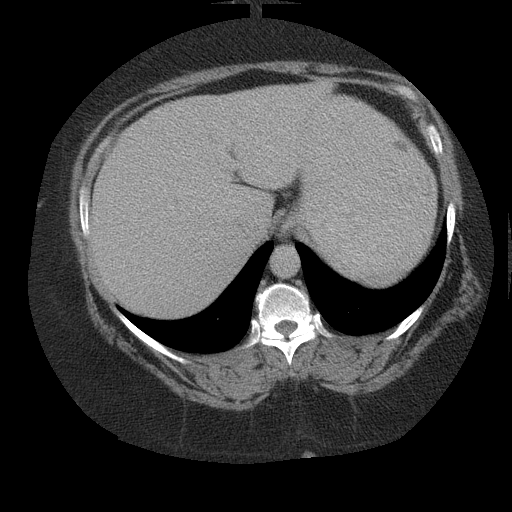
[im 83/104  lung]
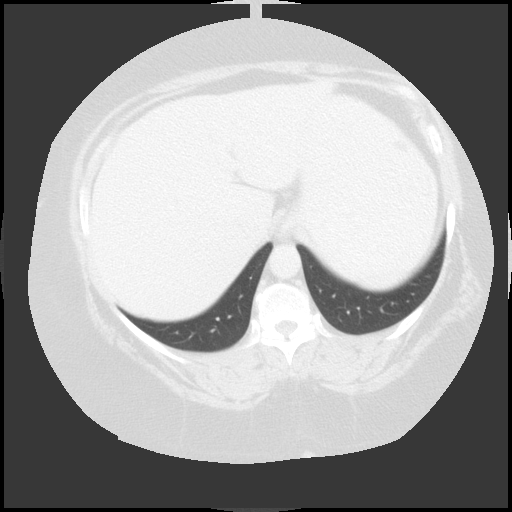

[Series 400: reformatted · sagittal · 1.03mm/px · 8 of 197 slices shown (1 of 2)]
[im 20/197  soft-tissue]
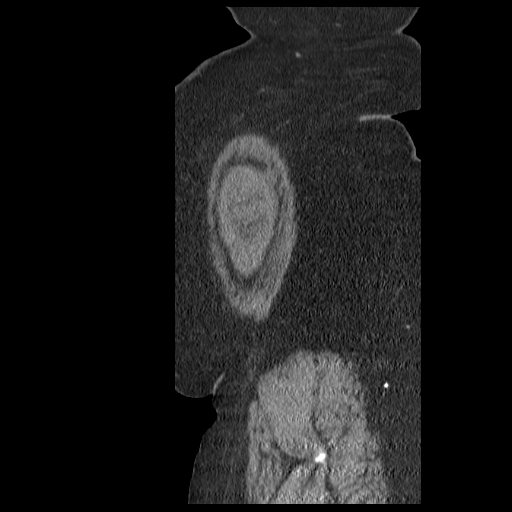
[im 40/197  soft-tissue]
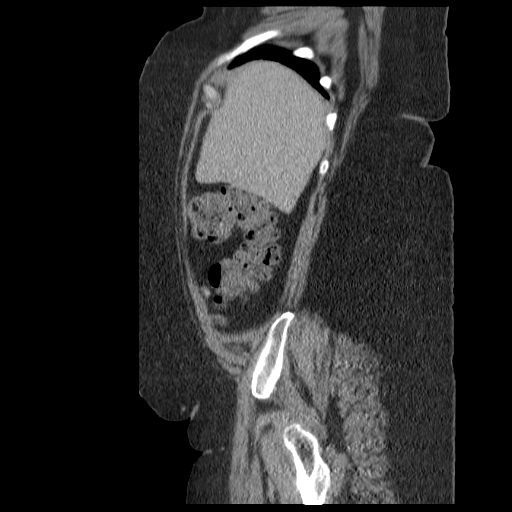
[im 59/197  soft-tissue]
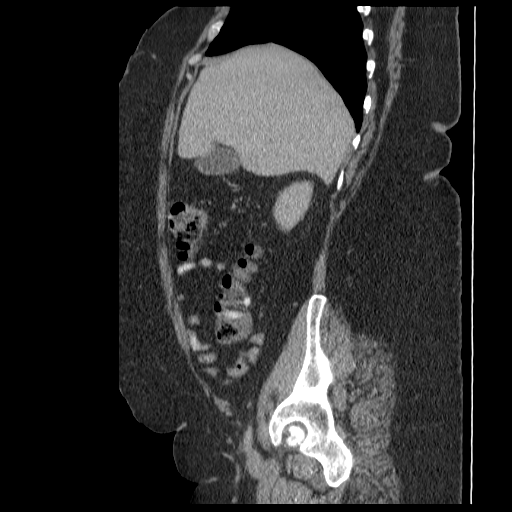
[im 79/197  soft-tissue]
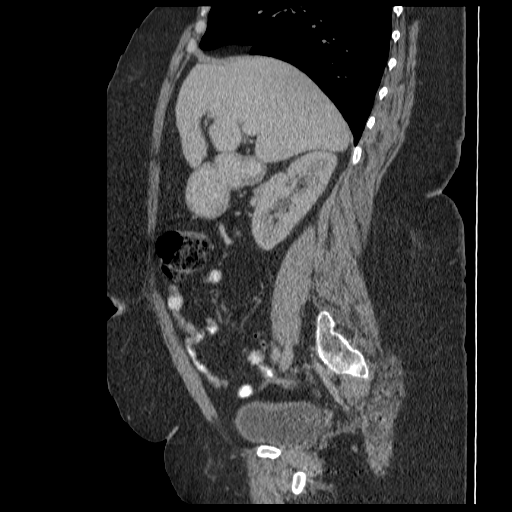
[im 118/197  soft-tissue]
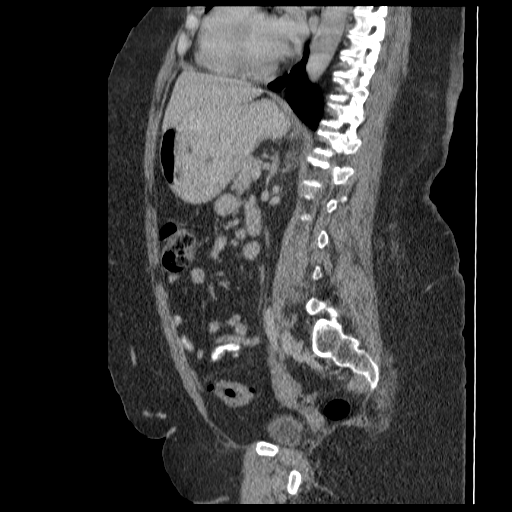
[im 138/197  soft-tissue]
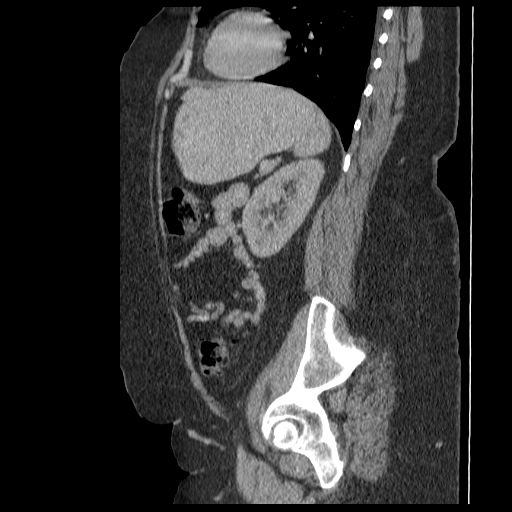
[im 157/197  soft-tissue]
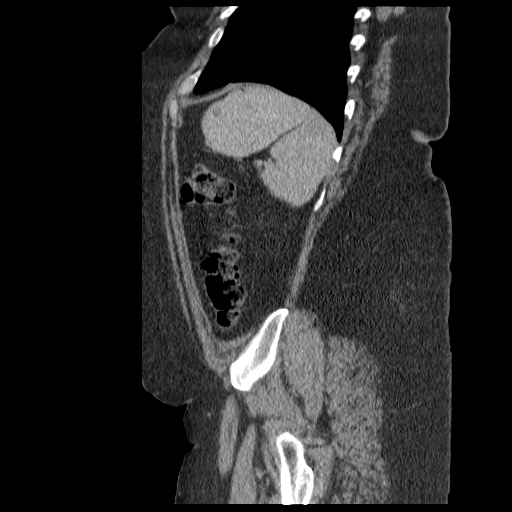
[im 177/197  soft-tissue]
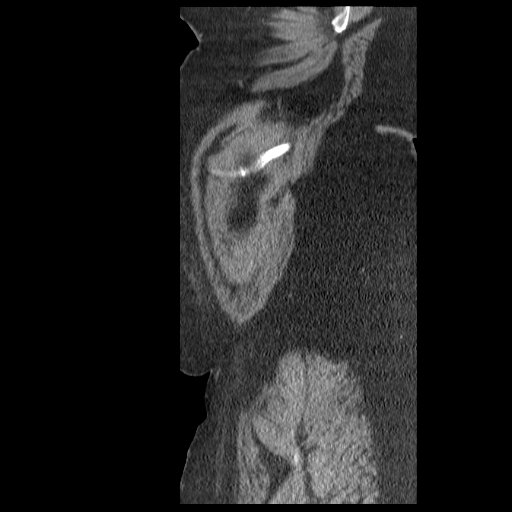

[Series 401: reformatted · coronal · 1.03mm/px · 2 of 189 slices shown (2 of 2)]
[im 19/189  soft-tissue]
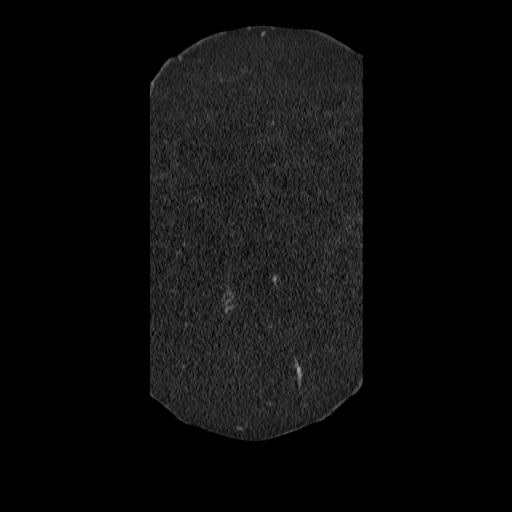
[im 38/189  soft-tissue]
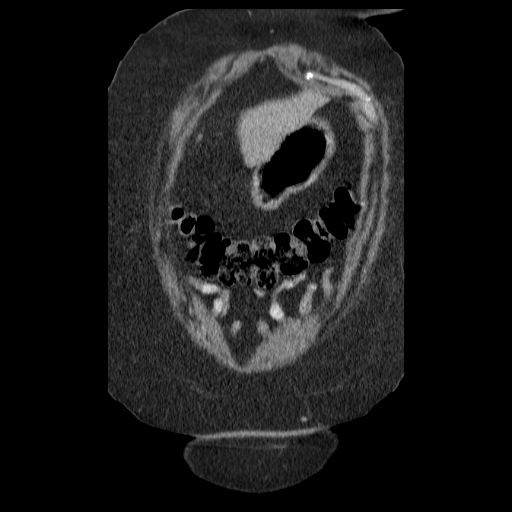

[14 of 32 positions shown; findings below may reference images not displayed]

FINDINGS: Lung bases clear

The liver is normal in attenuation and morphology.

Gallbladder normal.

Spleen is normal.

Both adrenal glands are normal.

Pancreas is normal.

Both kidneys are normal.

No free fluid within the upper abdomen.

No abnormal fluid collections. 

The appendix is normal.

There is diffuse diverticula arising from the left colon and sigmoid colon. No
evidence for acute diverticulitis.

Negative for retroperitoneal adenopathy.

Negative for small bowel mesenteric adenopathy.

Review of bone shows multilevel lumbar spondylosis.

IMPRESSION

No acute upper abdominal CT.  
PELVIS CT WITH CONTRAST
FINDINGS: Urinary bladder is negative.

Pelvic bowel loops are normal. There is no evidence for diverticulitis. There is
scattered diverticulosis. 

No free air.

No abnormal fluid collections. 

No masses.

Negative for adenopathy.

Review of bone windows is negative 

IMPRESSION

1.  No acute pelvic CT findings.
2.  Sigmoid diverticulosis without active inflammation.

## 2008-04-15 ENCOUNTER — Telehealth (INDEPENDENT_AMBULATORY_CARE_PROVIDER_SITE_OTHER): Payer: Self-pay | Admitting: *Deleted

## 2008-04-16 ENCOUNTER — Inpatient Hospital Stay (HOSPITAL_COMMUNITY): Admission: EM | Admit: 2008-04-16 | Discharge: 2008-04-17 | Payer: Self-pay | Admitting: Emergency Medicine

## 2008-04-17 ENCOUNTER — Ambulatory Visit: Payer: Self-pay | Admitting: Vascular Surgery

## 2008-04-17 ENCOUNTER — Encounter (INDEPENDENT_AMBULATORY_CARE_PROVIDER_SITE_OTHER): Payer: Self-pay | Admitting: Family Medicine

## 2008-04-17 ENCOUNTER — Encounter (INDEPENDENT_AMBULATORY_CARE_PROVIDER_SITE_OTHER): Payer: Self-pay | Admitting: Internal Medicine

## 2008-04-29 ENCOUNTER — Emergency Department (HOSPITAL_COMMUNITY): Admission: EM | Admit: 2008-04-29 | Discharge: 2008-04-29 | Payer: Self-pay | Admitting: Emergency Medicine

## 2008-04-29 ENCOUNTER — Encounter (INDEPENDENT_AMBULATORY_CARE_PROVIDER_SITE_OTHER): Payer: Self-pay | Admitting: Family Medicine

## 2008-04-30 ENCOUNTER — Telehealth (INDEPENDENT_AMBULATORY_CARE_PROVIDER_SITE_OTHER): Payer: Self-pay | Admitting: *Deleted

## 2008-05-05 ENCOUNTER — Ambulatory Visit: Payer: Self-pay | Admitting: Family Medicine

## 2008-05-06 ENCOUNTER — Emergency Department (HOSPITAL_COMMUNITY): Admission: EM | Admit: 2008-05-06 | Discharge: 2008-05-06 | Payer: Self-pay | Admitting: Emergency Medicine

## 2008-05-14 ENCOUNTER — Encounter: Admission: RE | Admit: 2008-05-14 | Discharge: 2008-05-14 | Payer: Self-pay | Admitting: Family Medicine

## 2008-05-15 IMAGING — CR DG ABDOMEN ACUTE W/ 1V CHEST
4 series · 4 of 4 positions shown · non-contrast
Comparison: Portable chest 01/12/07 and abdominal CT 11/29/06.

CLINICAL DATA: Lower abdominal pain with nausea and vomiting. 
 ACUTE ABDOMINAL SERIES WITH CHEST - 3 VIEW:

[w chest pa]
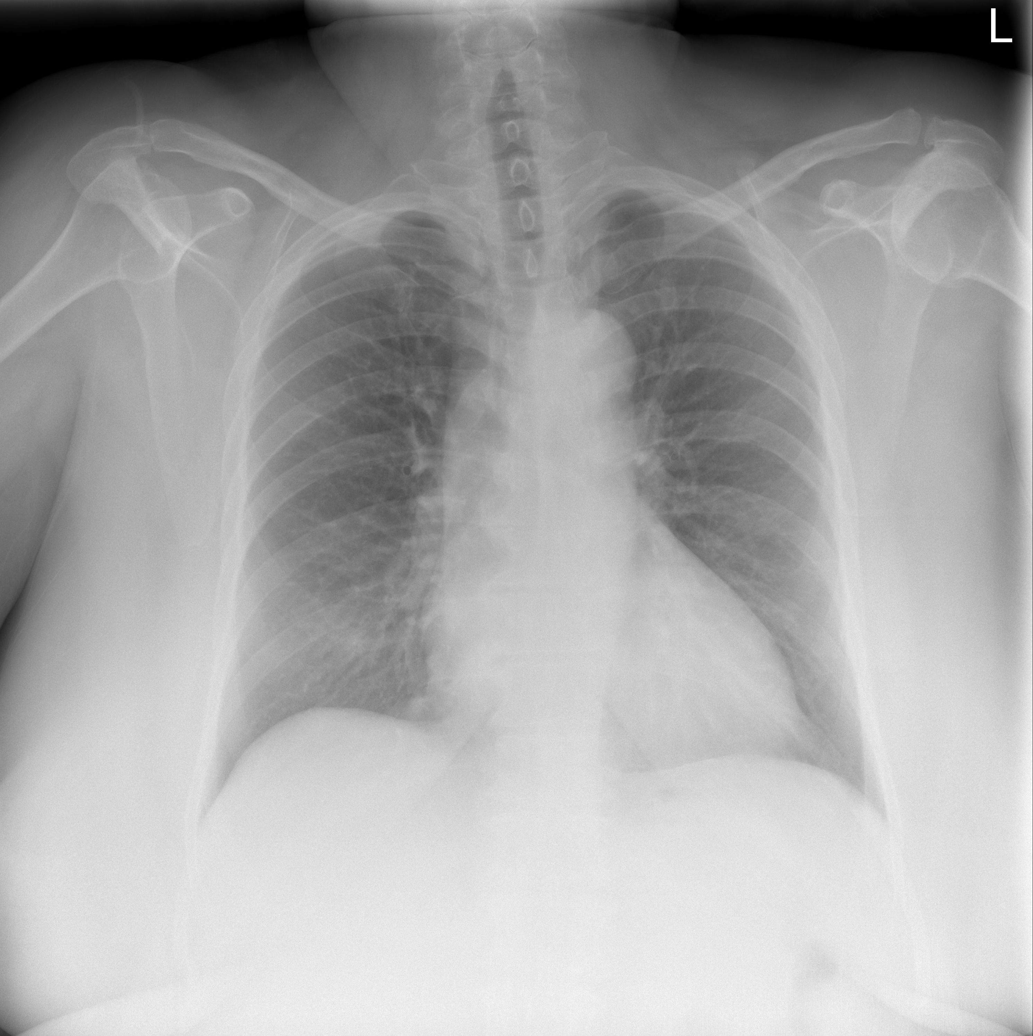

[w abdomen upright *]
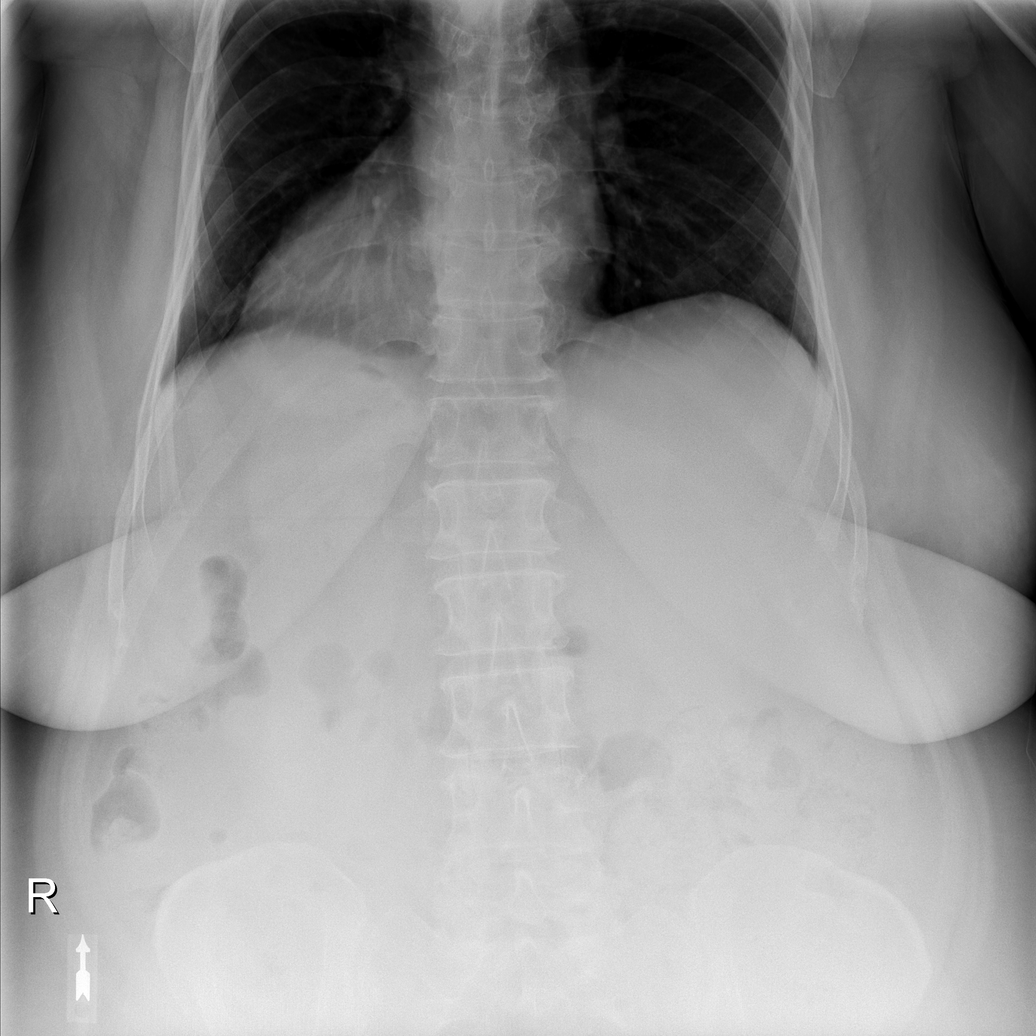

[t abdomen supine (1 of 2)]
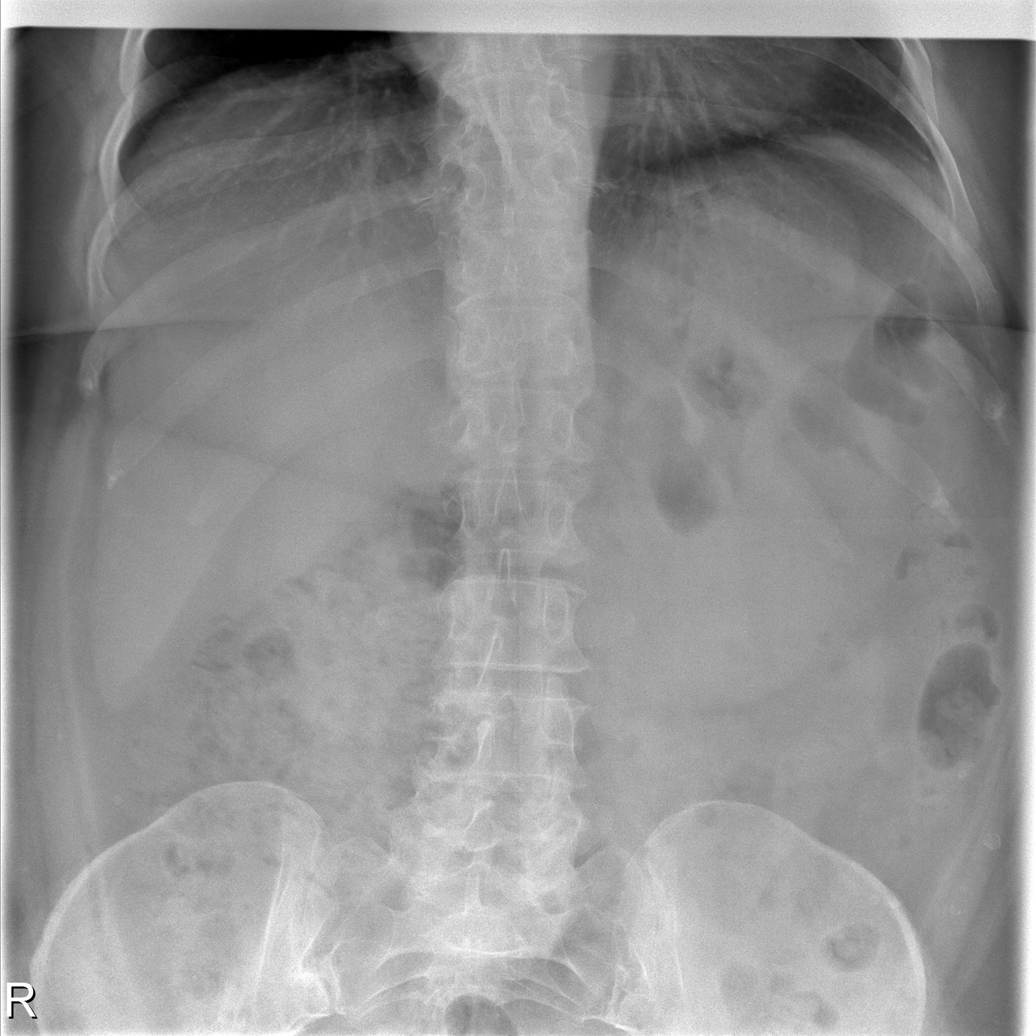

[t abdomen supine (2 of 2)]
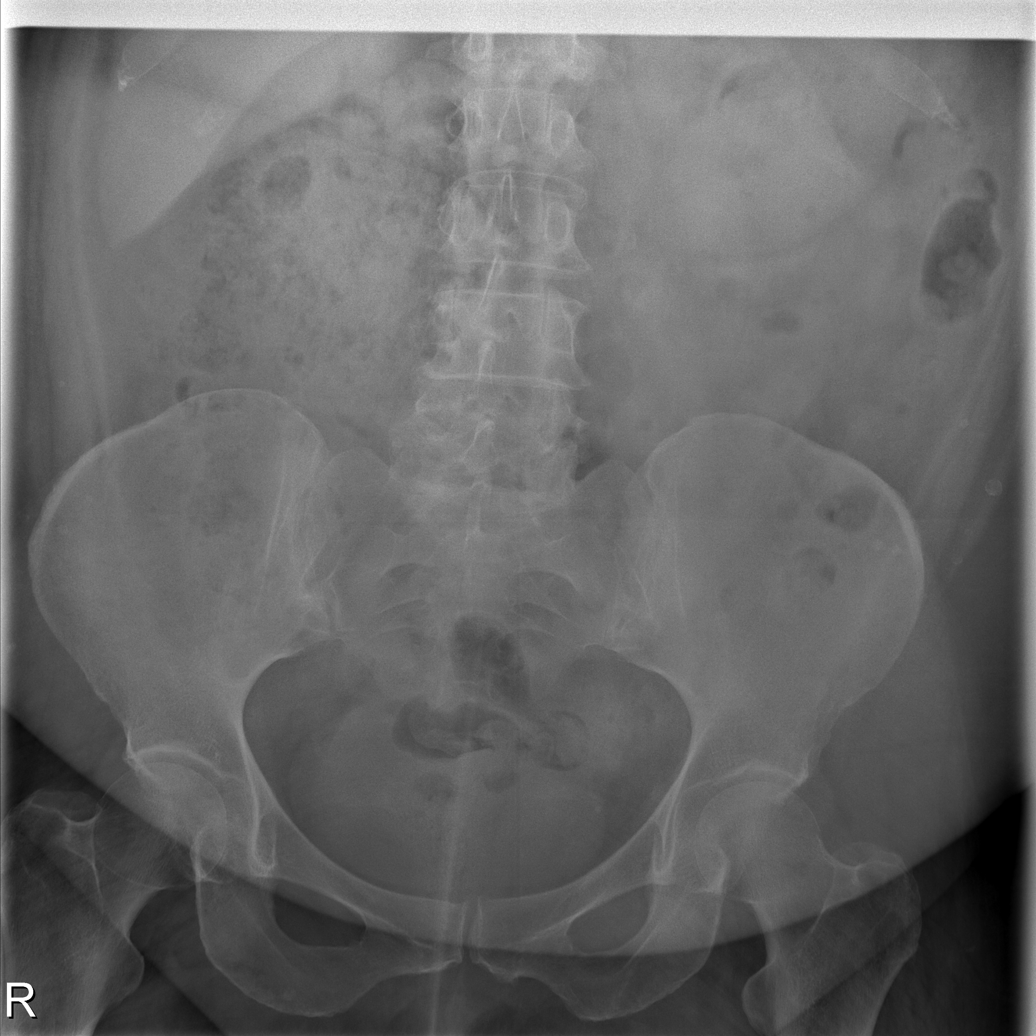

[4 of 4 positions shown; findings below may reference images not displayed]

FINDINGS: Frontal chest demonstrates stable mild cardiac enlargement. The mediastinal contours are normal. The lungs are clear. 
 Supine and erect views of the abdomen demonstrate a nonobstructive bowel gas pattern.  Stool is noted throughout the colon. There is no free intraperitoneal air or suspicious calcification. No acute osseous findings are seen.
IMPRESSION: No active cardiopulmonary or abdominal process demonstrated. Stable mild cardiac enlargement.

## 2008-05-16 IMAGING — CT CT PELVIS W/ CM
2 of 5 series · 17 of 46 positions shown, 19 images · IV contrast (omnipaque)
Comparison: Abdominal and pelvic CT 11/29/06.

CLINICAL DATA: Low abdominal and periumbilical pain with rectal bleeding, nausea and vomiting, and fever for 1 day. 
 ABDOMEN CT WITH CONTRAST:
TECHNIQUE: Multidetector CT imaging of the abdomen was performed following the standard protocol during bolus administration of intravenous contrast.
 Contrast:  150 cc Omnipaque 300.  Oral contrast was given.
TECHNIQUE: Multidetector CT imaging of the pelvis was performed following the standard protocol during bolus administration of intravenous contrast.

[Series 2: [id number] 5.0 b20s st · axial · 0.88mm/px · z∈[-540,-110]mm · 14 of 98 slices shown, 16 images]
[im 6/98  soft-tissue]
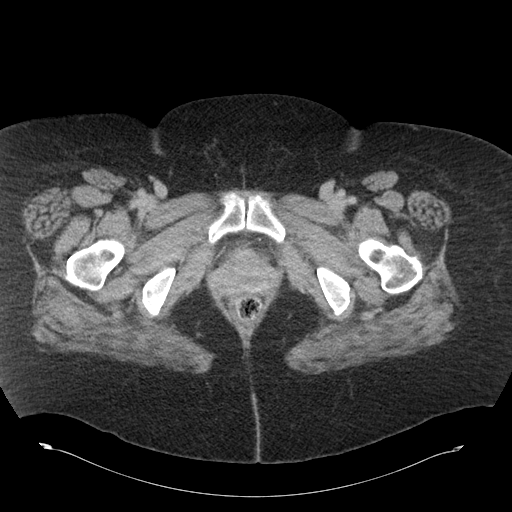
[im 6/98  bone]
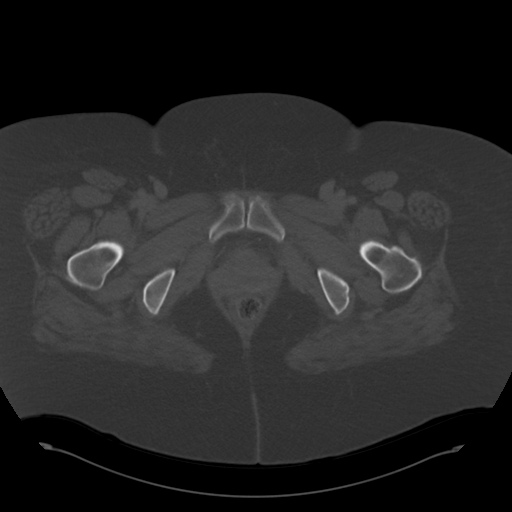
[im 11/98  soft-tissue]
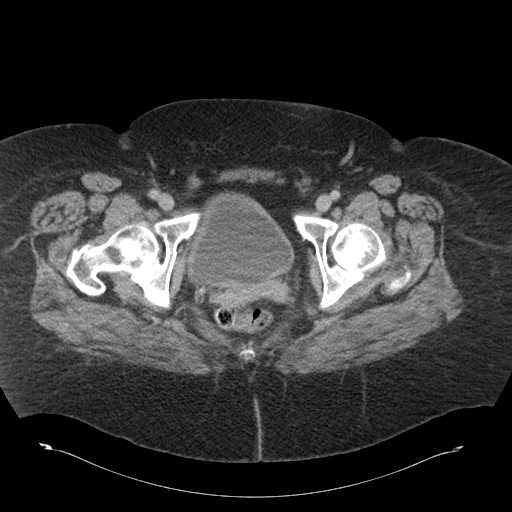
[im 21/98  soft-tissue]
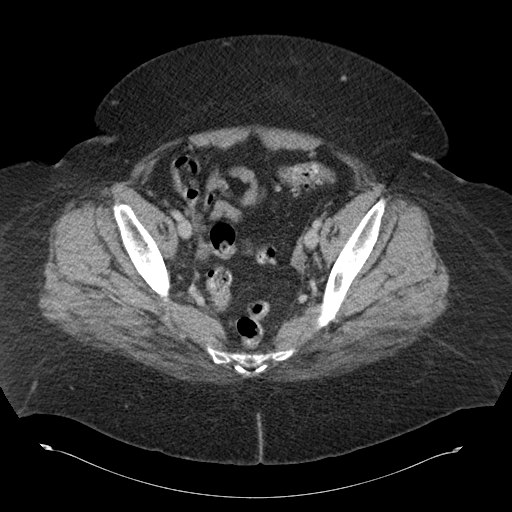
[im 26/98  soft-tissue]
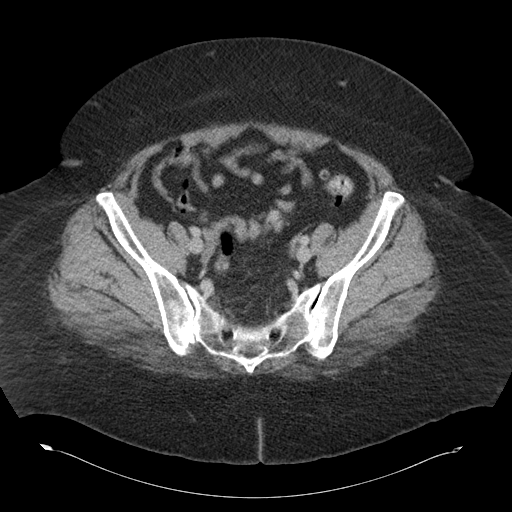
[im 31/98  soft-tissue]
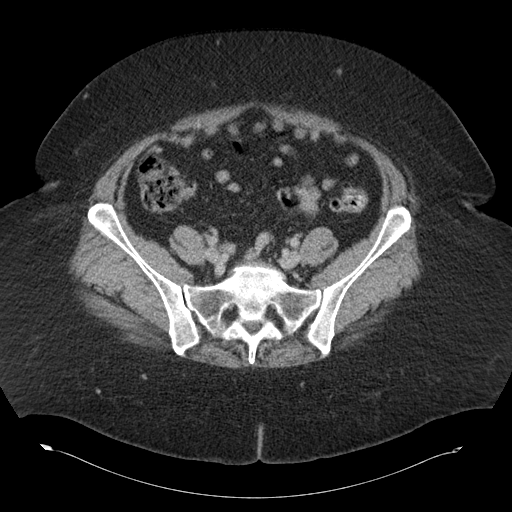
[im 41/98  soft-tissue]
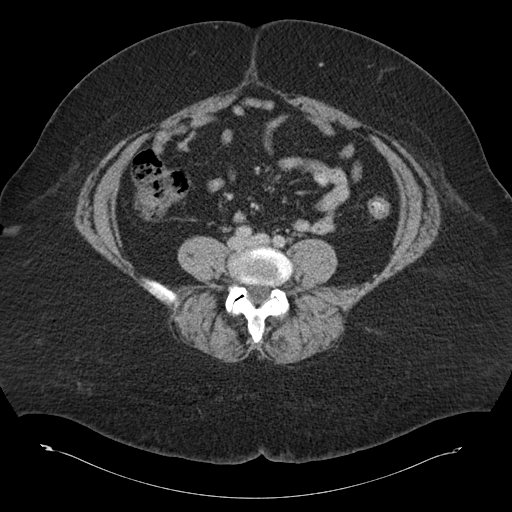
[im 46/98  soft-tissue]
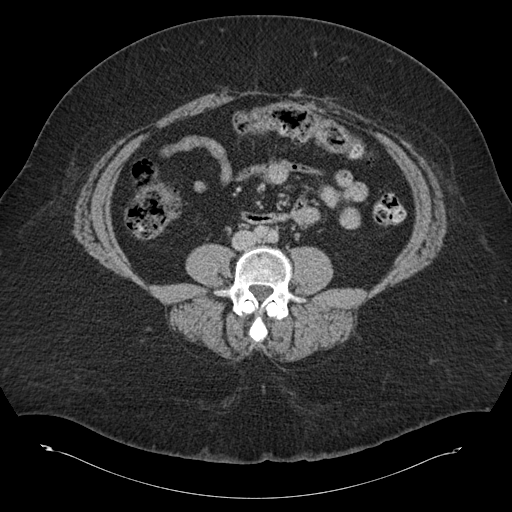
[im 52/98  soft-tissue]
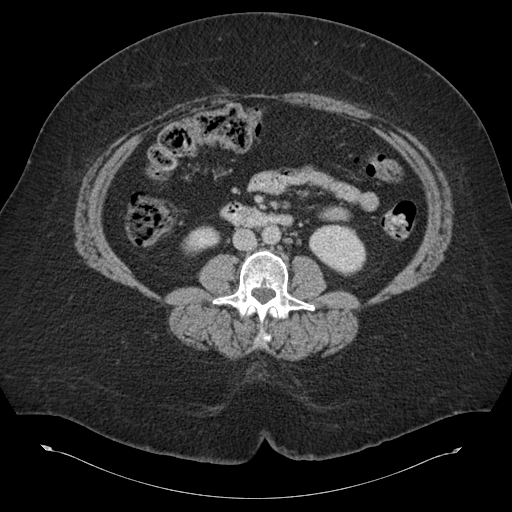
[im 57/98  soft-tissue]
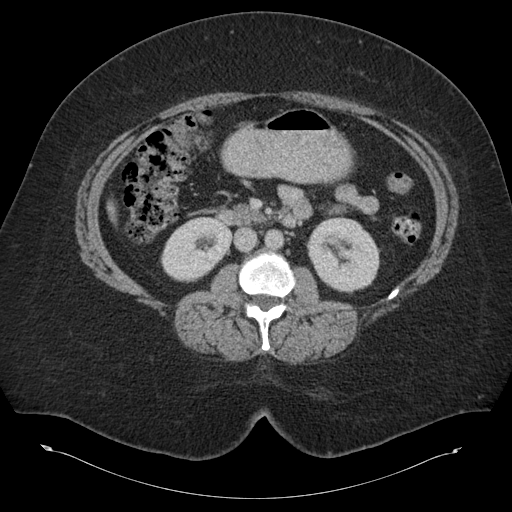
[im 57/98  bone]
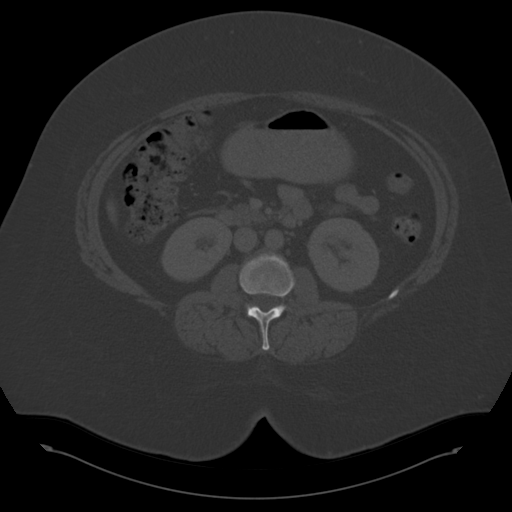
[im 67/98  soft-tissue]
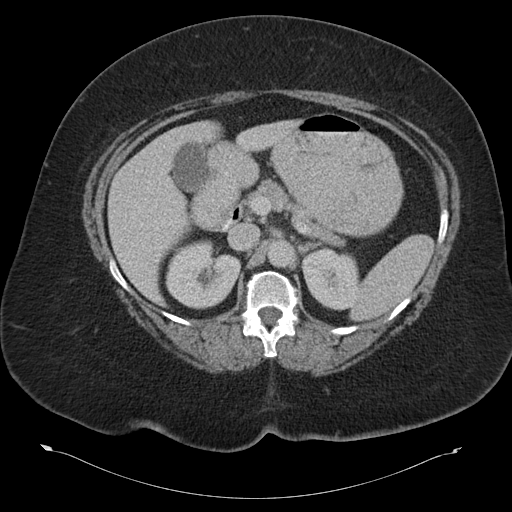
[im 72/98  soft-tissue]
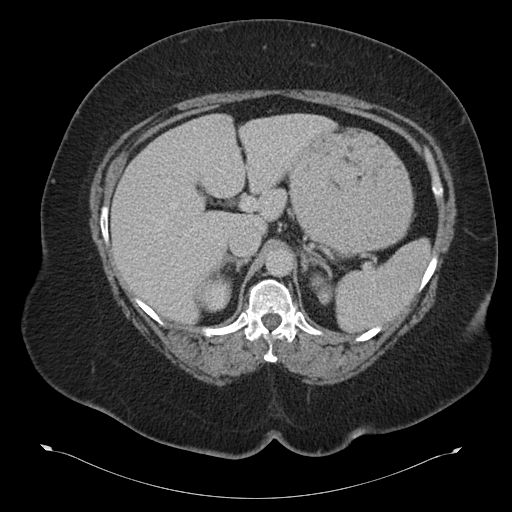
[im 77/98  soft-tissue]
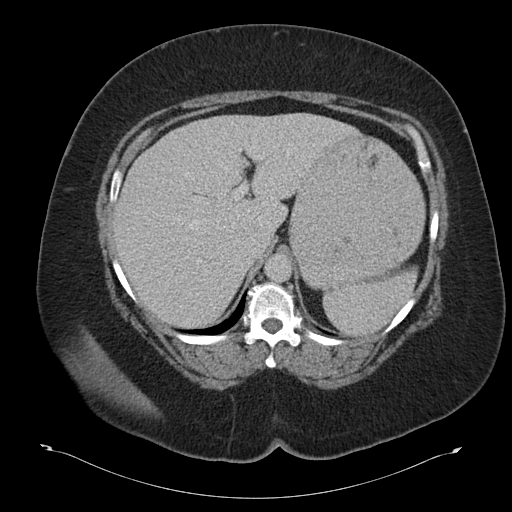
[im 87/98  soft-tissue]
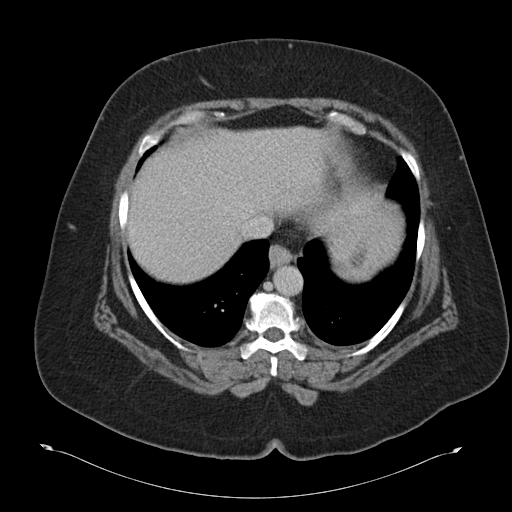
[im 92/98  soft-tissue]
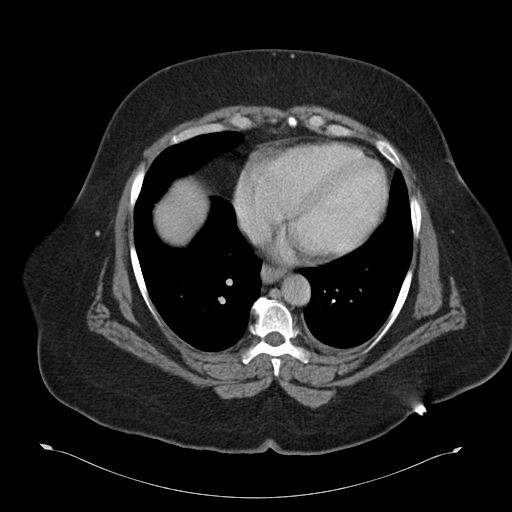

[Series 602: coronal images · coronal · 1.00mm/px · 3 of 95 slices shown]
[im 32/95  soft-tissue]
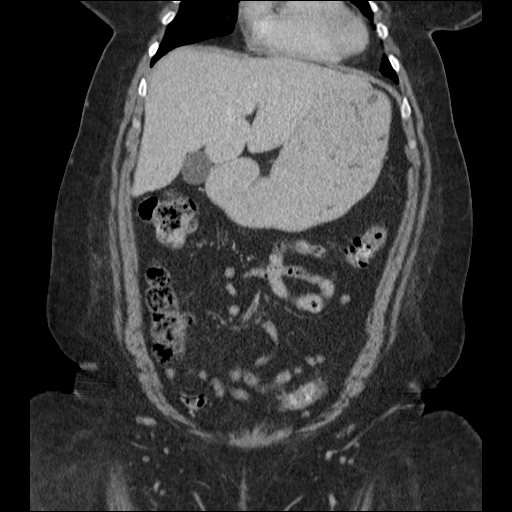
[im 42/95  soft-tissue]
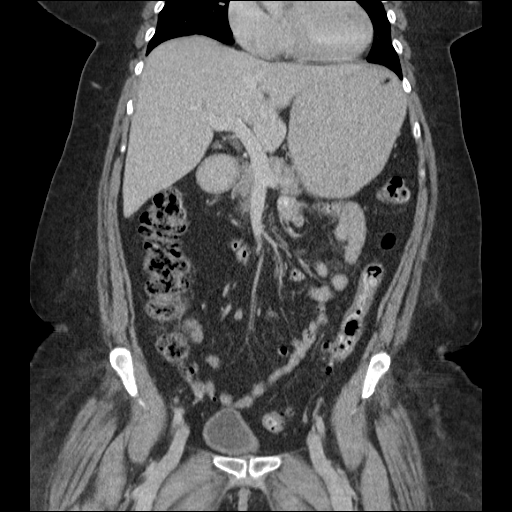
[im 53/95  soft-tissue]
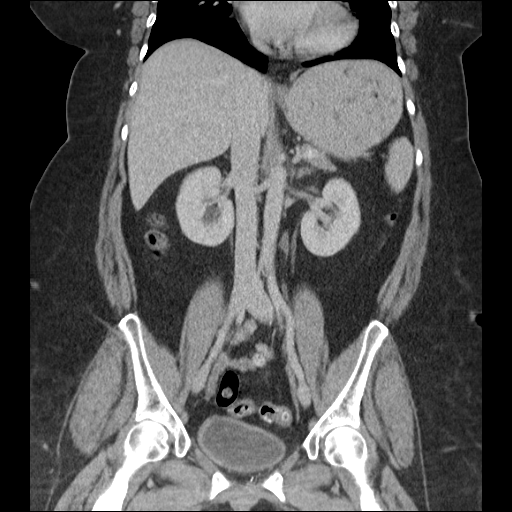

[17 of 46 positions shown; findings below may reference images not displayed]

FINDINGS: The lung bases are clear.  There is a tiny left pleural effusion which appears stable.  The heart is mildly enlarged.  The liver, spleen, gallbladder, pancreas, adrenal glands, and kidneys appear unremarkable.  Stool is noted throughout the colon.  No focal bowel lesions are identified.  There is no adenopathy or vascular abnormality.
IMPRESSION: Stable examination.  No acute abdominal findings.  Small left pleural effusion. 
 PELVIS CT WITH CONTRAST:
FINDINGS: The uterus is surgically absent.  Both ovaries appear normal.  There is no pelvic mass, fluid collection, or inflammatory process.  Sigmoid diverticulosis is again noted without evidence of acute inflammation.
IMPRESSION: 1.  Stable examination with sigmoid diverticulosis.  No acute findings. 
 2.  At the time of the patient?s prior examination, similar complaints were registered.  If the patient?s rectal bleeding has not been further evaluation, colonoscopy may be warranted.

## 2008-05-19 ENCOUNTER — Emergency Department (HOSPITAL_COMMUNITY): Admission: EM | Admit: 2008-05-19 | Discharge: 2008-05-19 | Payer: Self-pay | Admitting: Emergency Medicine

## 2008-06-02 ENCOUNTER — Emergency Department (HOSPITAL_COMMUNITY): Admission: EM | Admit: 2008-06-02 | Discharge: 2008-06-03 | Payer: Self-pay | Admitting: Emergency Medicine

## 2008-06-04 IMAGING — CR DG CHEST 1V PORT
1 series · 1 of 1 positions shown · non-contrast
Comparison: 01/12/07.

CLINICAL DATA: Chest pain, asthma, hematemesis.  
 PORTABLE CHEST ? 1 VIEW:

[view not recorded]
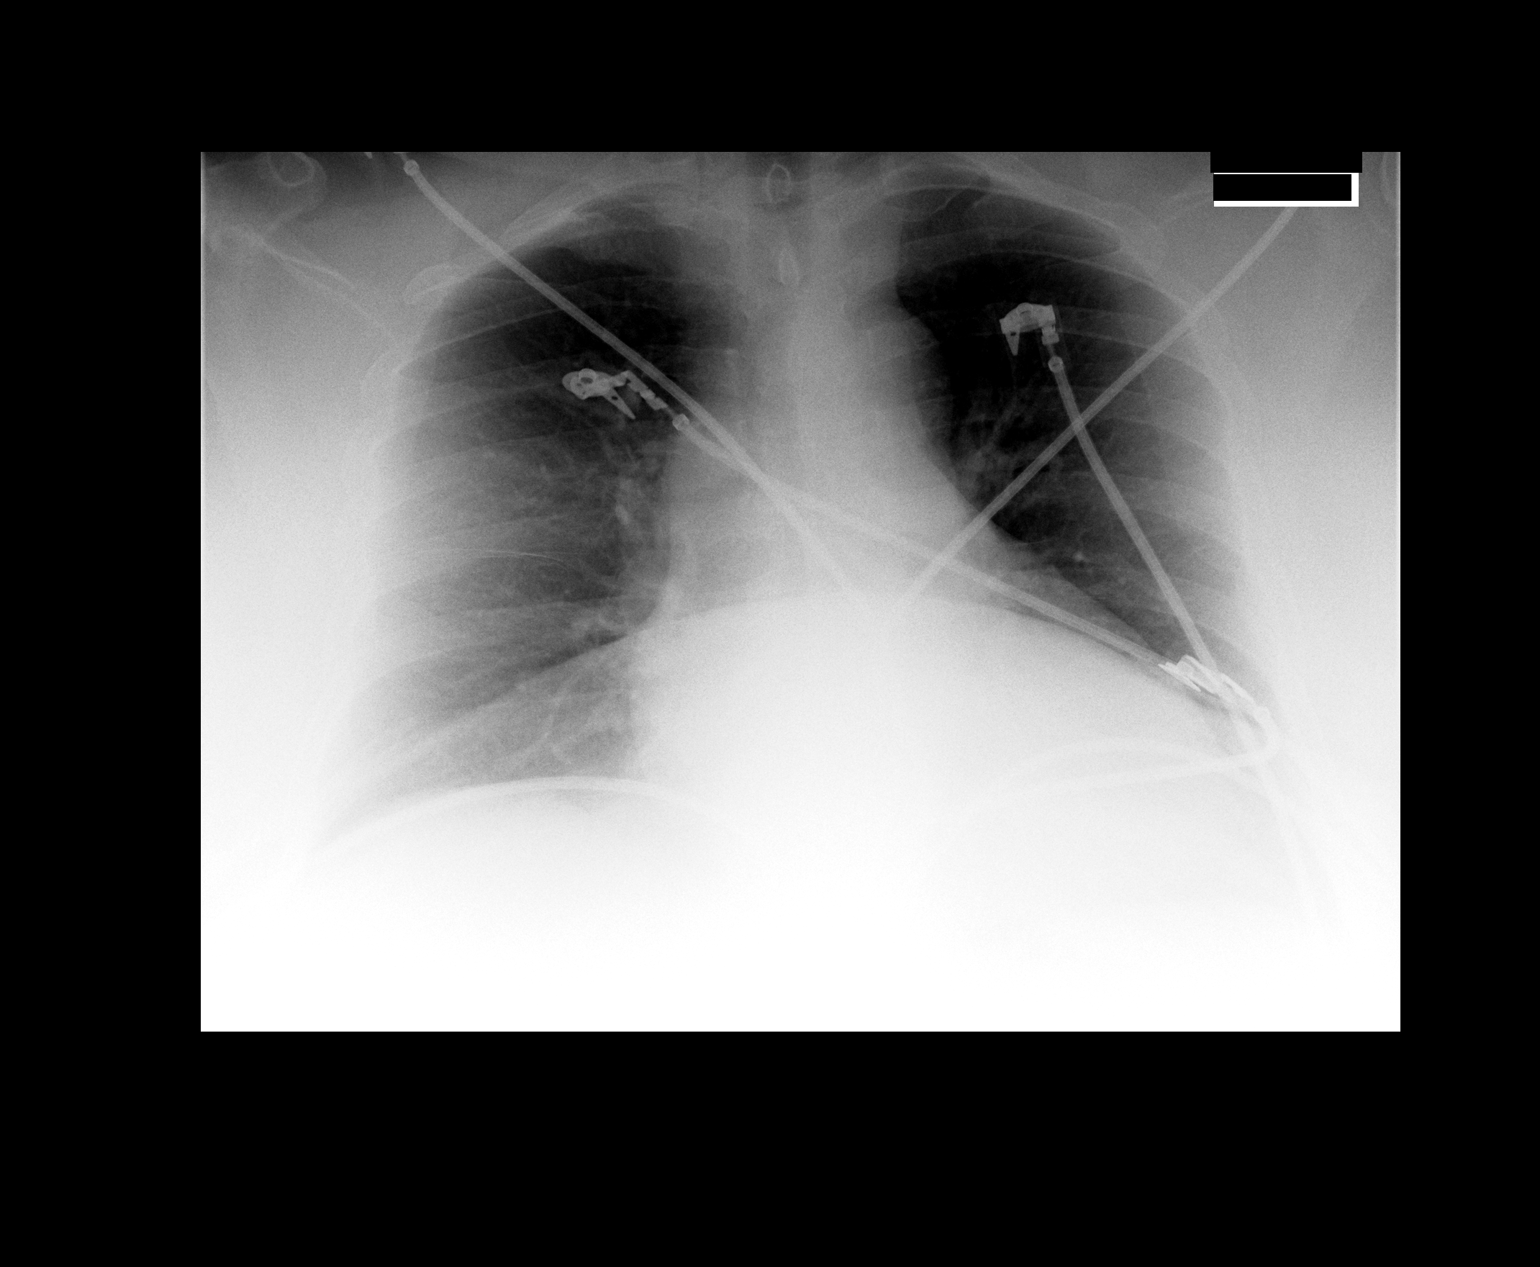

[1 of 1 positions shown; findings below may reference images not displayed]

FINDINGS: Trachea is midline.  Heart size is stable.  Lungs are somewhat low in volume but clear.
IMPRESSION: No acute findings.

## 2008-06-17 ENCOUNTER — Encounter: Payer: Self-pay | Admitting: Emergency Medicine

## 2008-06-17 ENCOUNTER — Inpatient Hospital Stay (HOSPITAL_COMMUNITY): Admission: RE | Admit: 2008-06-17 | Discharge: 2008-06-18 | Payer: Self-pay | Admitting: Psychiatry

## 2008-06-17 ENCOUNTER — Ambulatory Visit: Payer: Self-pay | Admitting: Psychiatry

## 2008-07-02 ENCOUNTER — Telehealth (INDEPENDENT_AMBULATORY_CARE_PROVIDER_SITE_OTHER): Payer: Self-pay | Admitting: *Deleted

## 2008-07-02 IMAGING — CR DG CHEST 1V PORT
1 series · 1 of 1 positions shown · non-contrast
Comparison: [DATE]

CLINICAL DATA: Chest pain

PORTABLE CHEST - 1 VIEW
TECHNIQUE: Portable AP semi upright.

[view not recorded]
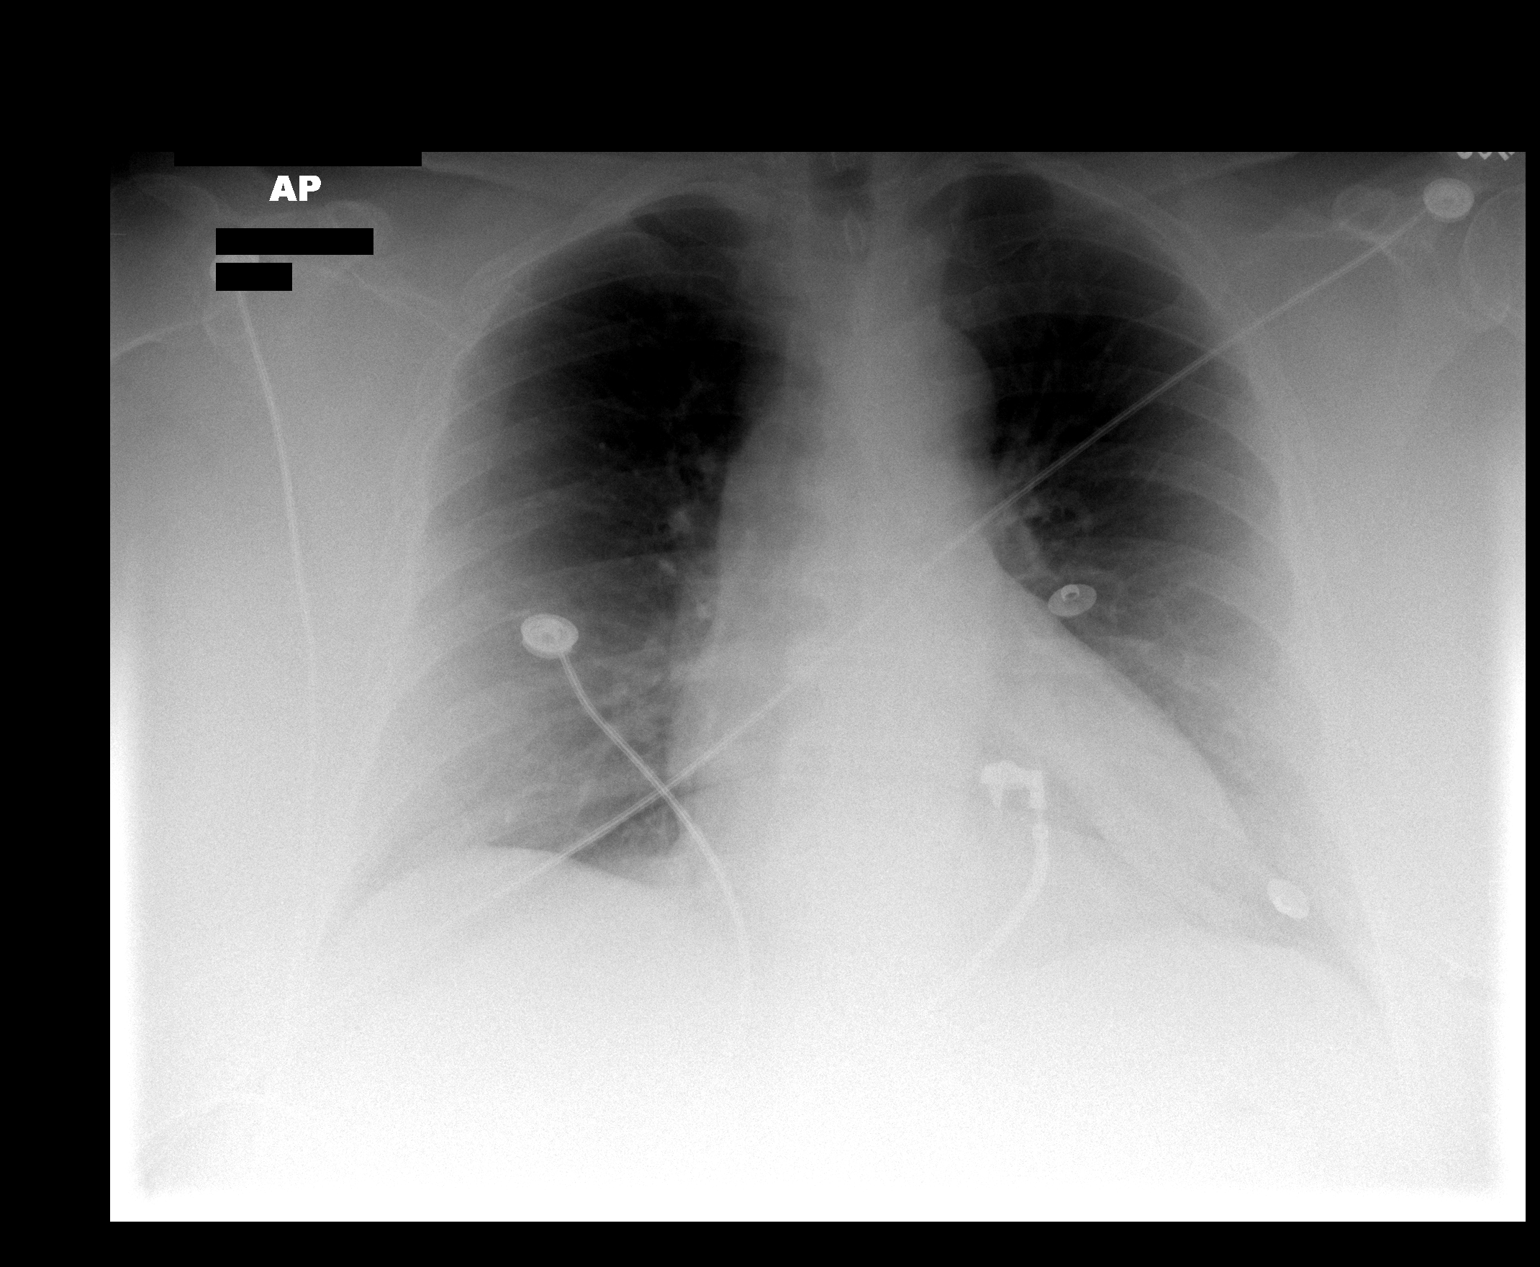

[1 of 1 positions shown; findings below may reference images not displayed]

FINDINGS: Heart size upper limits of normal for projection. No focal
consolidation or air space disease. No pleural effusions. Uncoiling of thoracic
aorta.  

IMPRESSION

No active cardiopulmonary disease.

## 2008-07-07 ENCOUNTER — Telehealth (INDEPENDENT_AMBULATORY_CARE_PROVIDER_SITE_OTHER): Payer: Self-pay | Admitting: *Deleted

## 2008-07-29 IMAGING — CR DG CHEST 1V PORT
1 series · 1 of 1 positions shown · non-contrast
Comparison: 03/05/07.

CLINICAL DATA: Chest pain.  
 PORTABLE CHEST - 1 VIEW 04/01/07:

[view not recorded]
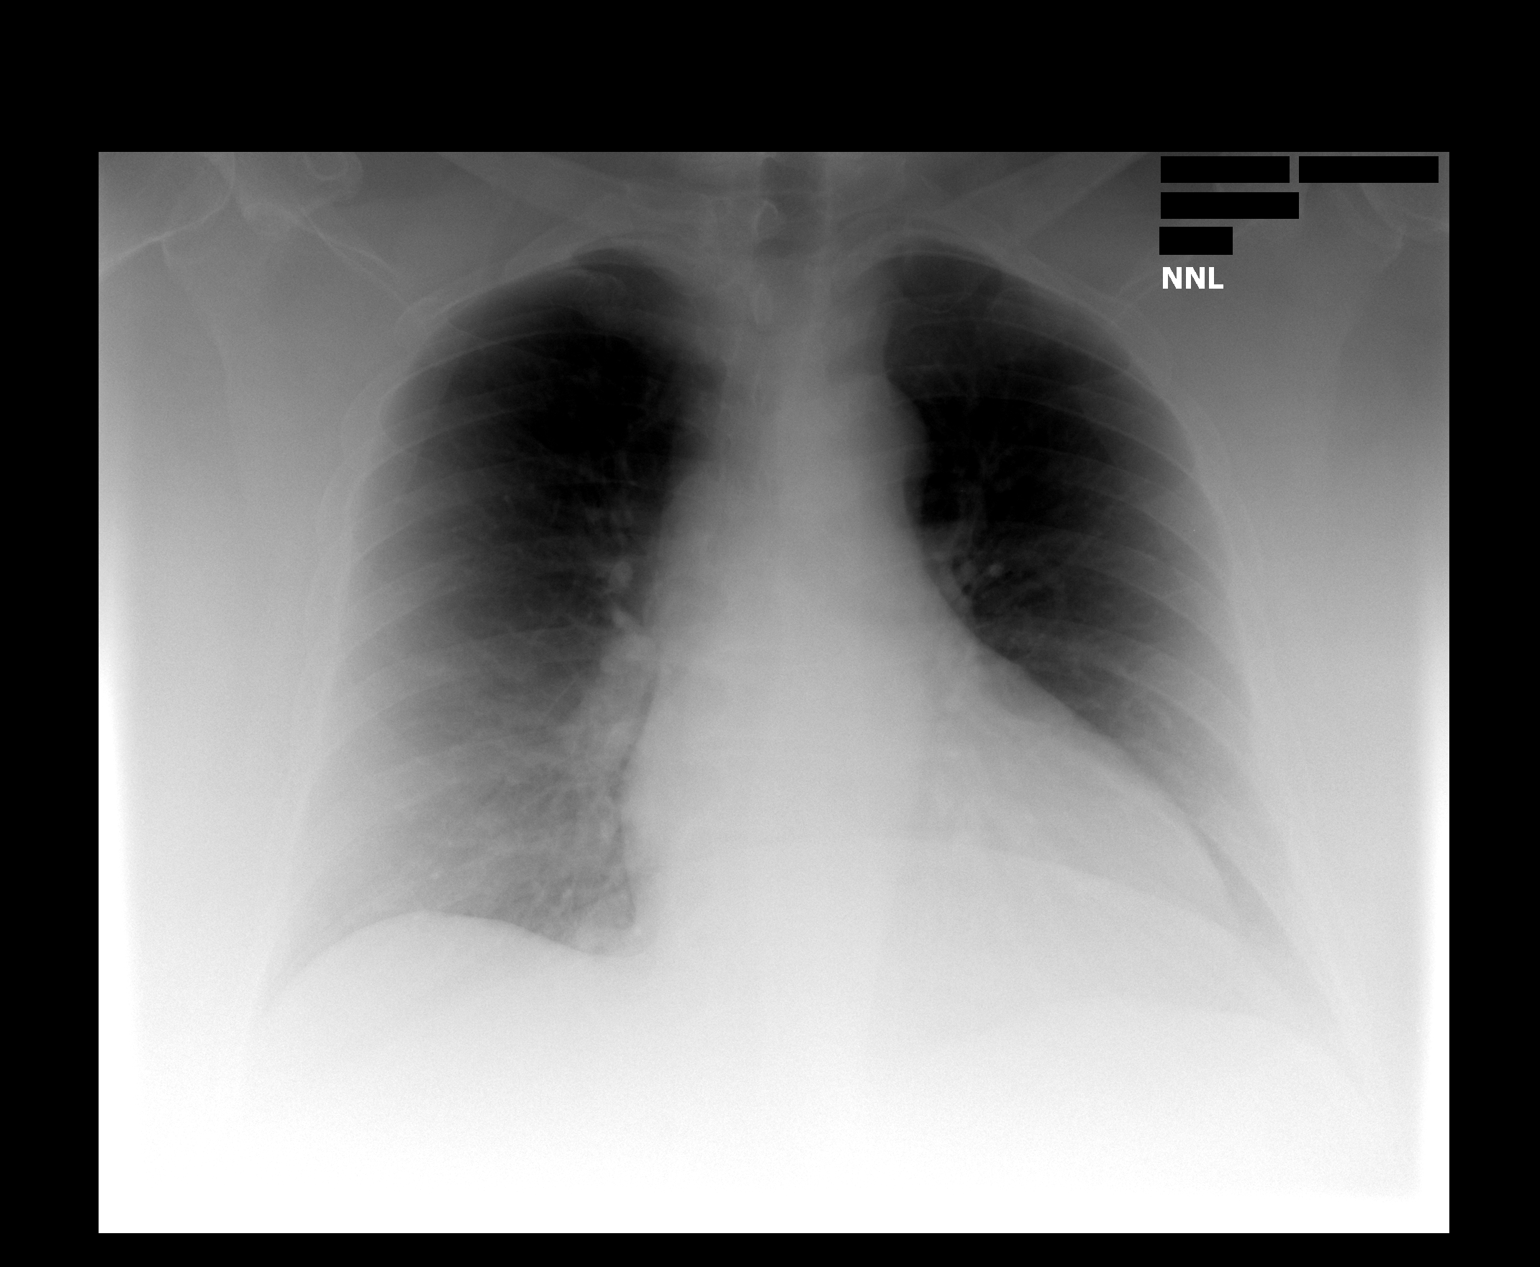

[1 of 1 positions shown; findings below may reference images not displayed]

FINDINGS: Fine detailed is obscured by portable technique and body habitus.  Heart size is normal and the lungs are grossly clear.
IMPRESSION: No acute cardiopulmonary process.

## 2008-08-05 ENCOUNTER — Emergency Department (HOSPITAL_COMMUNITY): Admission: EM | Admit: 2008-08-05 | Discharge: 2008-08-05 | Payer: Self-pay | Admitting: Emergency Medicine

## 2008-08-12 ENCOUNTER — Telehealth (INDEPENDENT_AMBULATORY_CARE_PROVIDER_SITE_OTHER): Payer: Self-pay | Admitting: *Deleted

## 2008-08-12 ENCOUNTER — Ambulatory Visit: Payer: Self-pay | Admitting: Family Medicine

## 2008-08-12 DIAGNOSIS — R0989 Other specified symptoms and signs involving the circulatory and respiratory systems: Secondary | ICD-10-CM

## 2008-08-12 DIAGNOSIS — R0609 Other forms of dyspnea: Secondary | ICD-10-CM

## 2008-08-12 LAB — CONVERTED CEMR LAB
Bilirubin Urine: NEGATIVE
Blood in Urine, dipstick: NEGATIVE
Protein, U semiquant: NEGATIVE
Specific Gravity, Urine: <1.005
Urobilinogen, UA: 0.2
WBC Urine, dipstick: NEGATIVE
pH: 6.5

## 2008-08-25 ENCOUNTER — Telehealth (INDEPENDENT_AMBULATORY_CARE_PROVIDER_SITE_OTHER): Payer: Self-pay | Admitting: *Deleted

## 2008-08-28 ENCOUNTER — Encounter (INDEPENDENT_AMBULATORY_CARE_PROVIDER_SITE_OTHER): Payer: Self-pay | Admitting: Family Medicine

## 2008-09-04 ENCOUNTER — Emergency Department (HOSPITAL_COMMUNITY): Admission: EM | Admit: 2008-09-04 | Discharge: 2008-09-04 | Payer: Self-pay | Admitting: Emergency Medicine

## 2008-09-05 ENCOUNTER — Encounter (INDEPENDENT_AMBULATORY_CARE_PROVIDER_SITE_OTHER): Payer: Self-pay | Admitting: Family Medicine

## 2008-09-14 ENCOUNTER — Emergency Department (HOSPITAL_COMMUNITY): Admission: EM | Admit: 2008-09-14 | Discharge: 2008-09-14 | Payer: Self-pay | Admitting: Emergency Medicine

## 2008-09-26 ENCOUNTER — Ambulatory Visit (HOSPITAL_COMMUNITY): Admission: RE | Admit: 2008-09-26 | Discharge: 2008-09-26 | Payer: Self-pay | Admitting: Ophthalmology

## 2008-10-07 ENCOUNTER — Emergency Department (HOSPITAL_COMMUNITY): Admission: EM | Admit: 2008-10-07 | Discharge: 2008-10-07 | Payer: Self-pay | Admitting: Emergency Medicine

## 2008-10-14 ENCOUNTER — Telehealth (INDEPENDENT_AMBULATORY_CARE_PROVIDER_SITE_OTHER): Payer: Self-pay | Admitting: Family Medicine

## 2008-10-23 IMAGING — CR DG CHEST 1V PORT
1 series · 1 of 1 positions shown · non-contrast
Comparison: 04/01/07.

CLINICAL DATA: 56-year-old with chest pain and ear pain.  
 PORTABLE CHEST:

[AP]
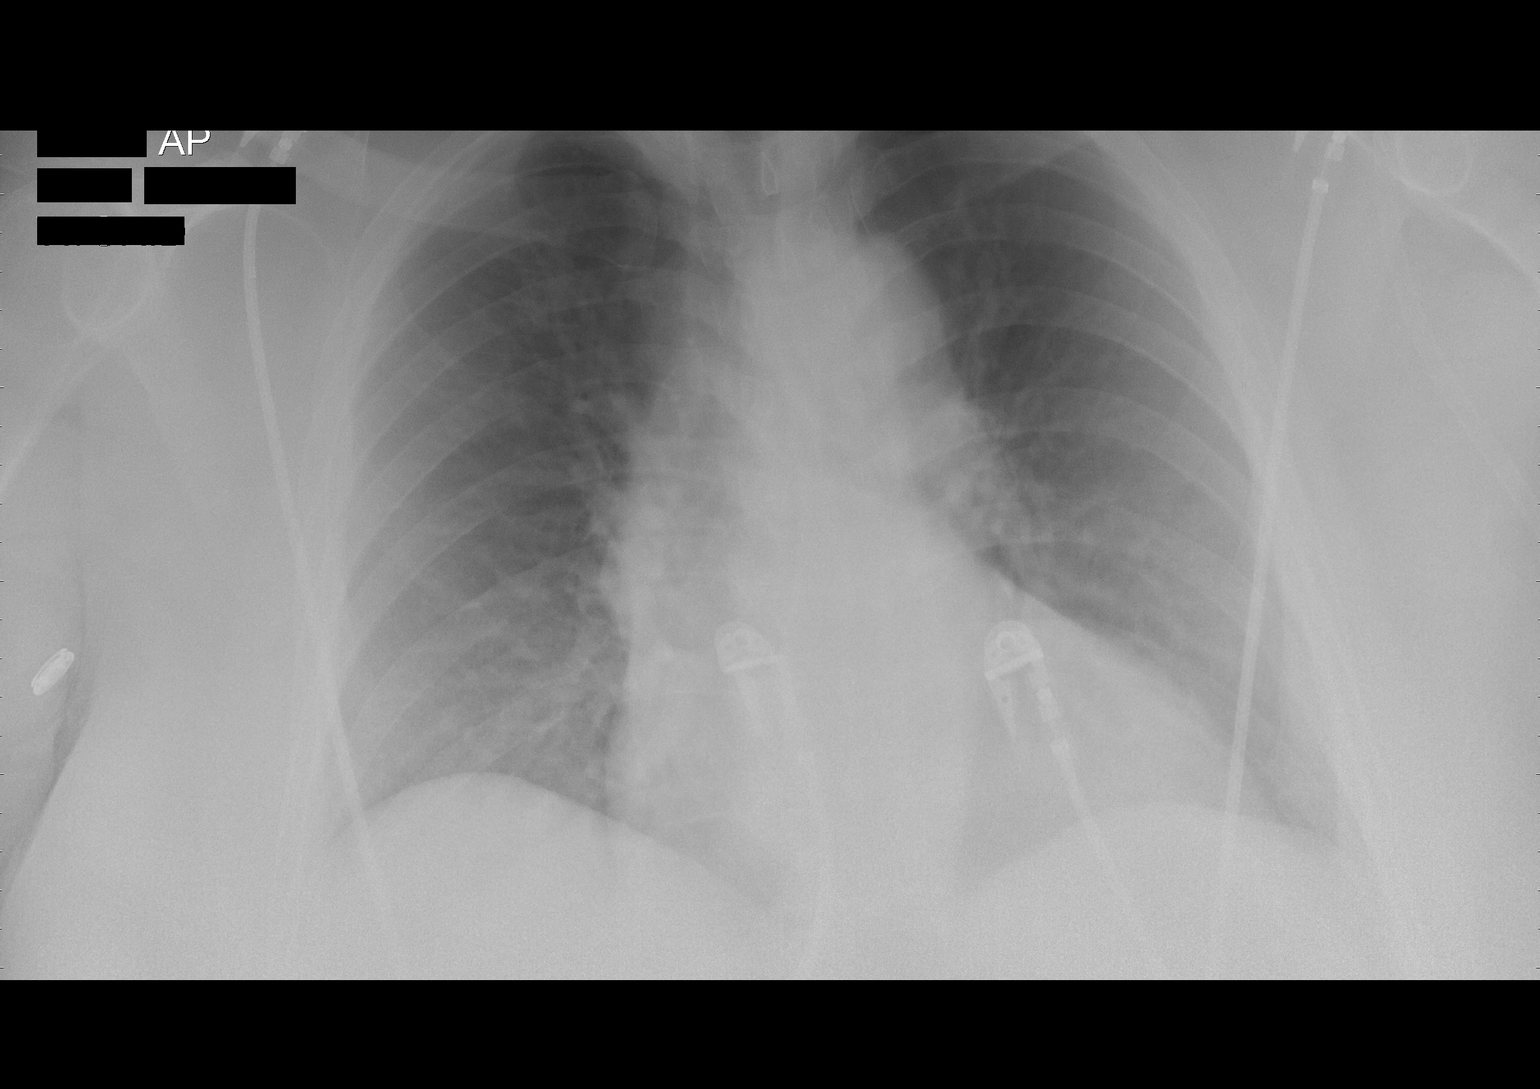

[1 of 1 positions shown; findings below may reference images not displayed]

FINDINGS: Portable AP view of the chest was obtained.  Slight prominence of the interstitial markings but no focal disease and no frank pulmonary edema.  Patient is slightly rotated on this study.  The heart and mediastinum are within normal limits.  Bone structures are intact.
IMPRESSION: No acute chest findings.

## 2008-10-24 IMAGING — CR DG CHEST 2V
2 series · 2 of 2 positions shown · non-contrast
Comparison: Yesterday's exam.

CLINICAL DATA: Chest pain.
 CHEST - 2 VIEW:

[w chest pa]
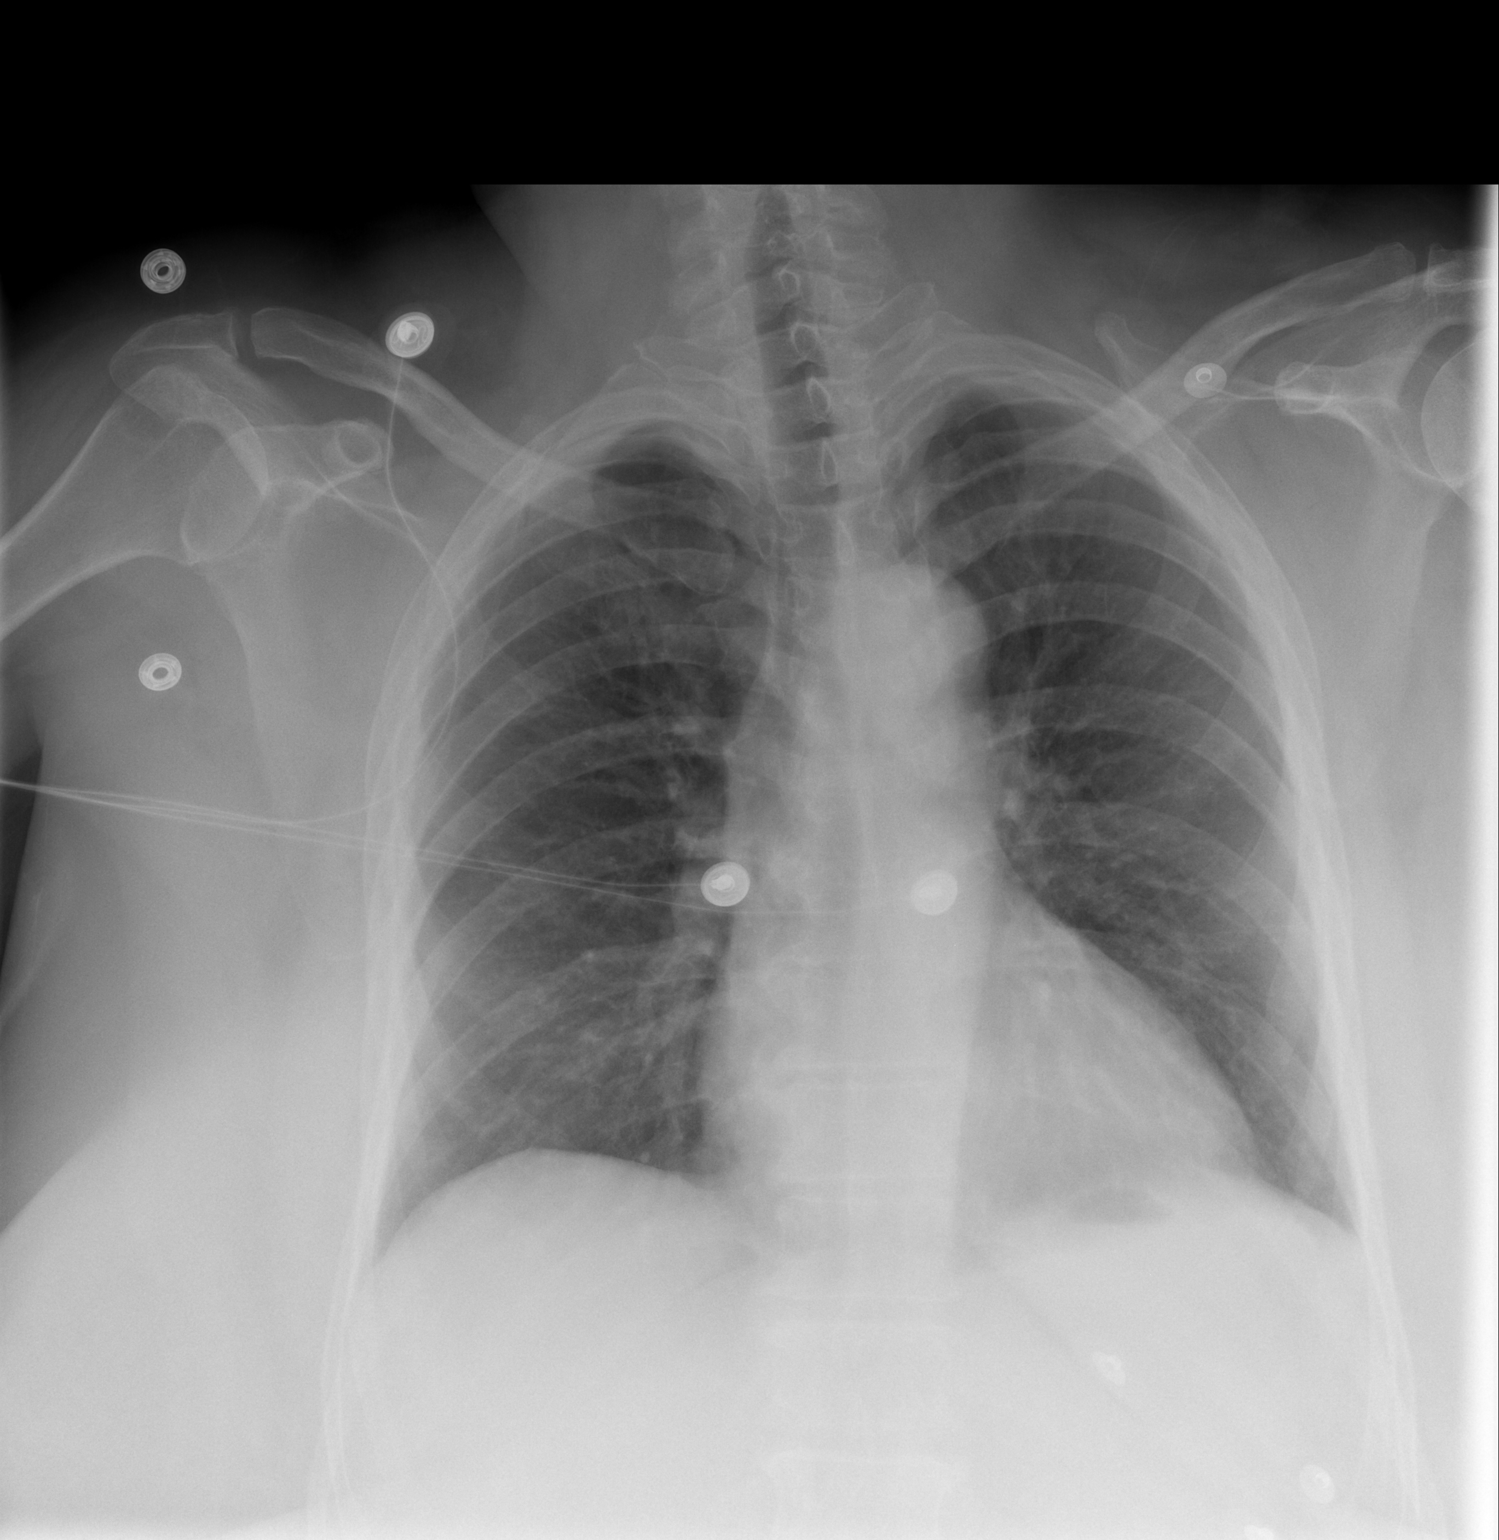

[w chest lat]
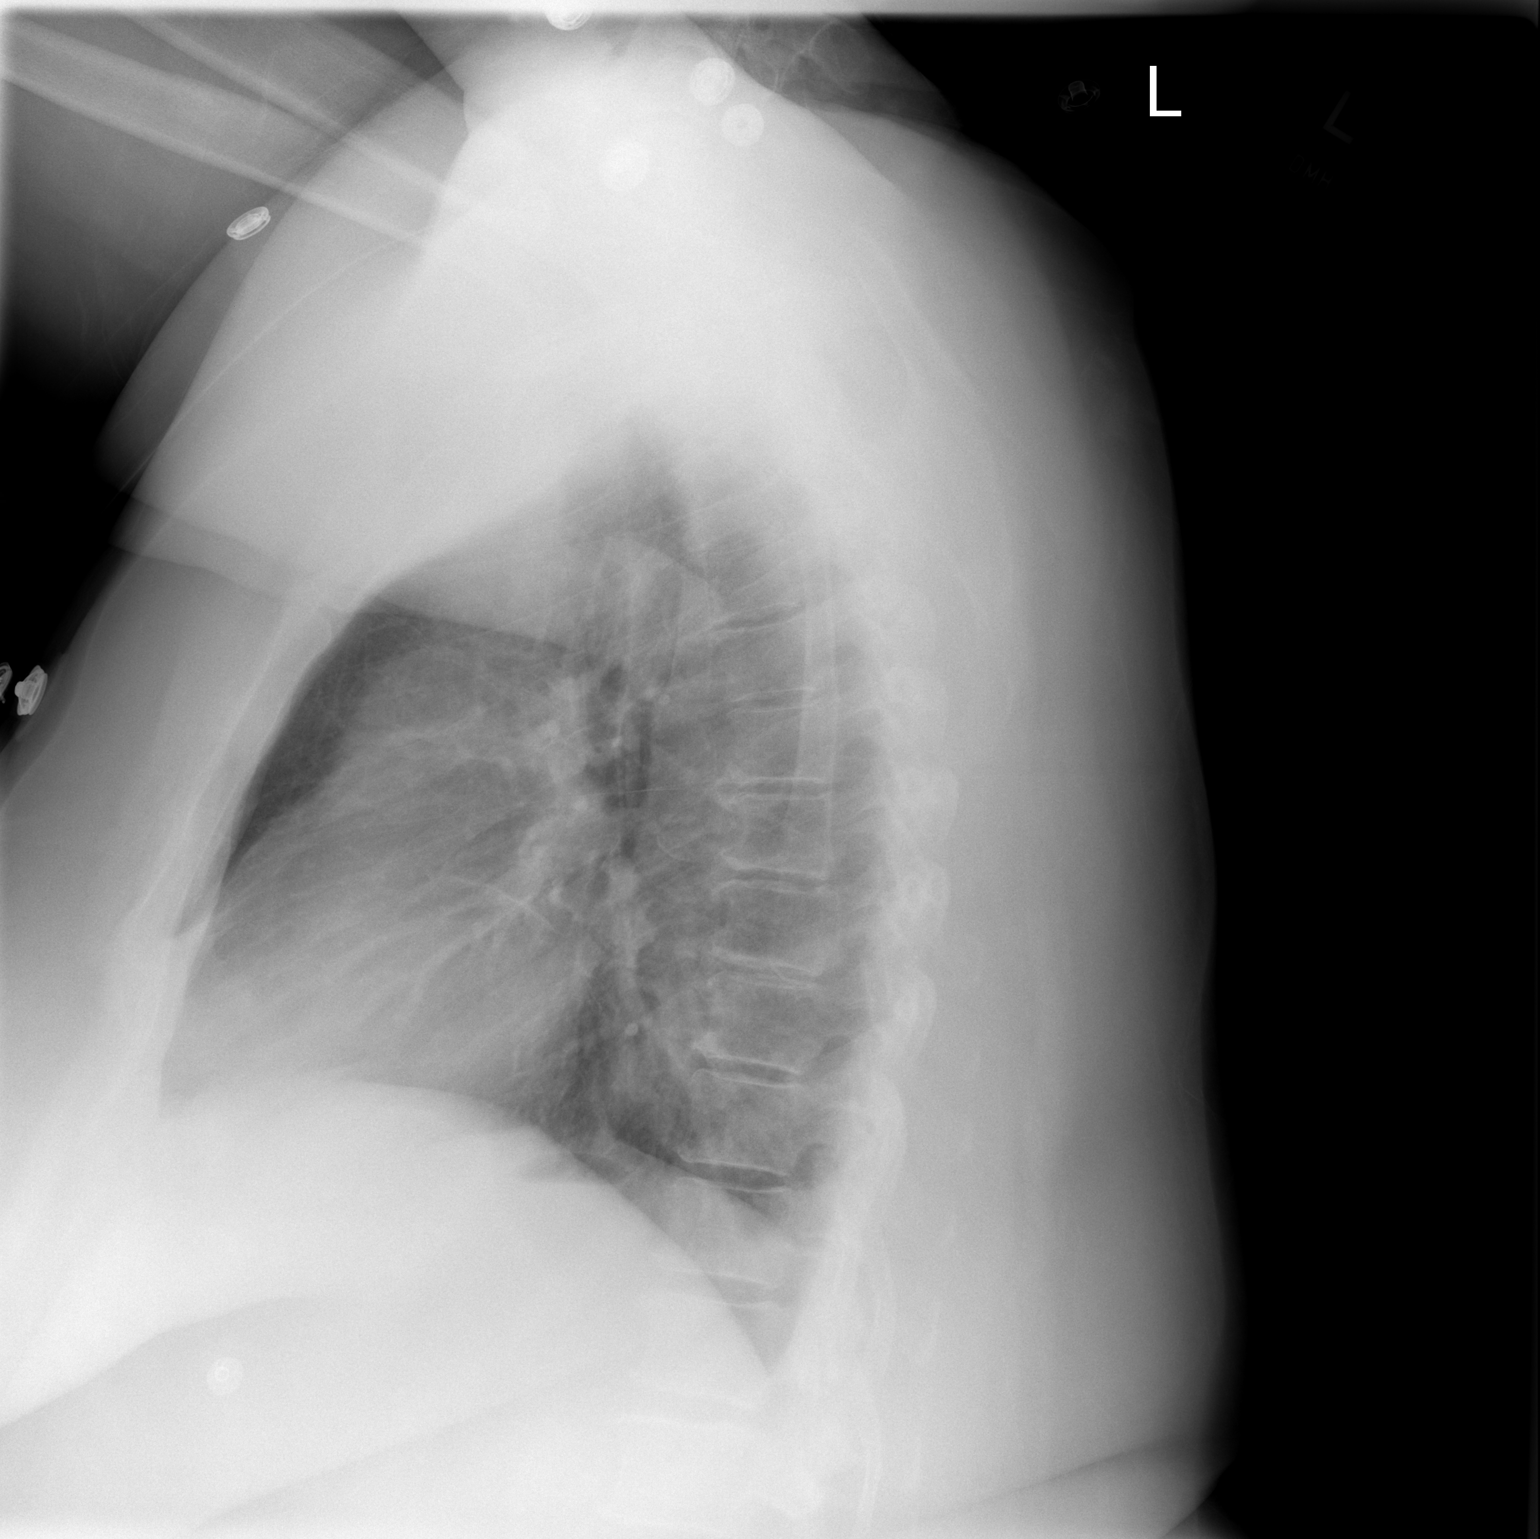

[2 of 2 positions shown; findings below may reference images not displayed]

FINDINGS: Cardiomegaly.  Some decrease in degree of pulmonary vascular congestion.  Mild vascular congestion is noted on today's exam.  No pulmonary edema or pleural effusions.
IMPRESSION: Cardiomegaly and mild to moderate pulmonary vascular congestion which appears slightly improved on today's exam.

## 2008-10-31 ENCOUNTER — Encounter (INDEPENDENT_AMBULATORY_CARE_PROVIDER_SITE_OTHER): Payer: Self-pay | Admitting: Family Medicine

## 2008-11-12 ENCOUNTER — Ambulatory Visit: Payer: Self-pay | Admitting: Family Medicine

## 2008-11-13 ENCOUNTER — Emergency Department (HOSPITAL_COMMUNITY): Admission: EM | Admit: 2008-11-13 | Discharge: 2008-11-13 | Payer: Self-pay | Admitting: Emergency Medicine

## 2008-11-18 ENCOUNTER — Telehealth (INDEPENDENT_AMBULATORY_CARE_PROVIDER_SITE_OTHER): Payer: Self-pay | Admitting: Family Medicine

## 2008-11-20 ENCOUNTER — Encounter (INDEPENDENT_AMBULATORY_CARE_PROVIDER_SITE_OTHER): Payer: Self-pay | Admitting: Family Medicine

## 2008-11-26 ENCOUNTER — Encounter (INDEPENDENT_AMBULATORY_CARE_PROVIDER_SITE_OTHER): Payer: Self-pay | Admitting: *Deleted

## 2008-11-27 IMAGING — CR DG CHEST 2V
2 series · 2 of 2 positions shown · non-contrast
Comparison: 06/27/07 and earlier.

CLINICAL DATA: 56-year-old female with shortness of breath.  
 CHEST - 2 VIEW:

[w chest pa *]
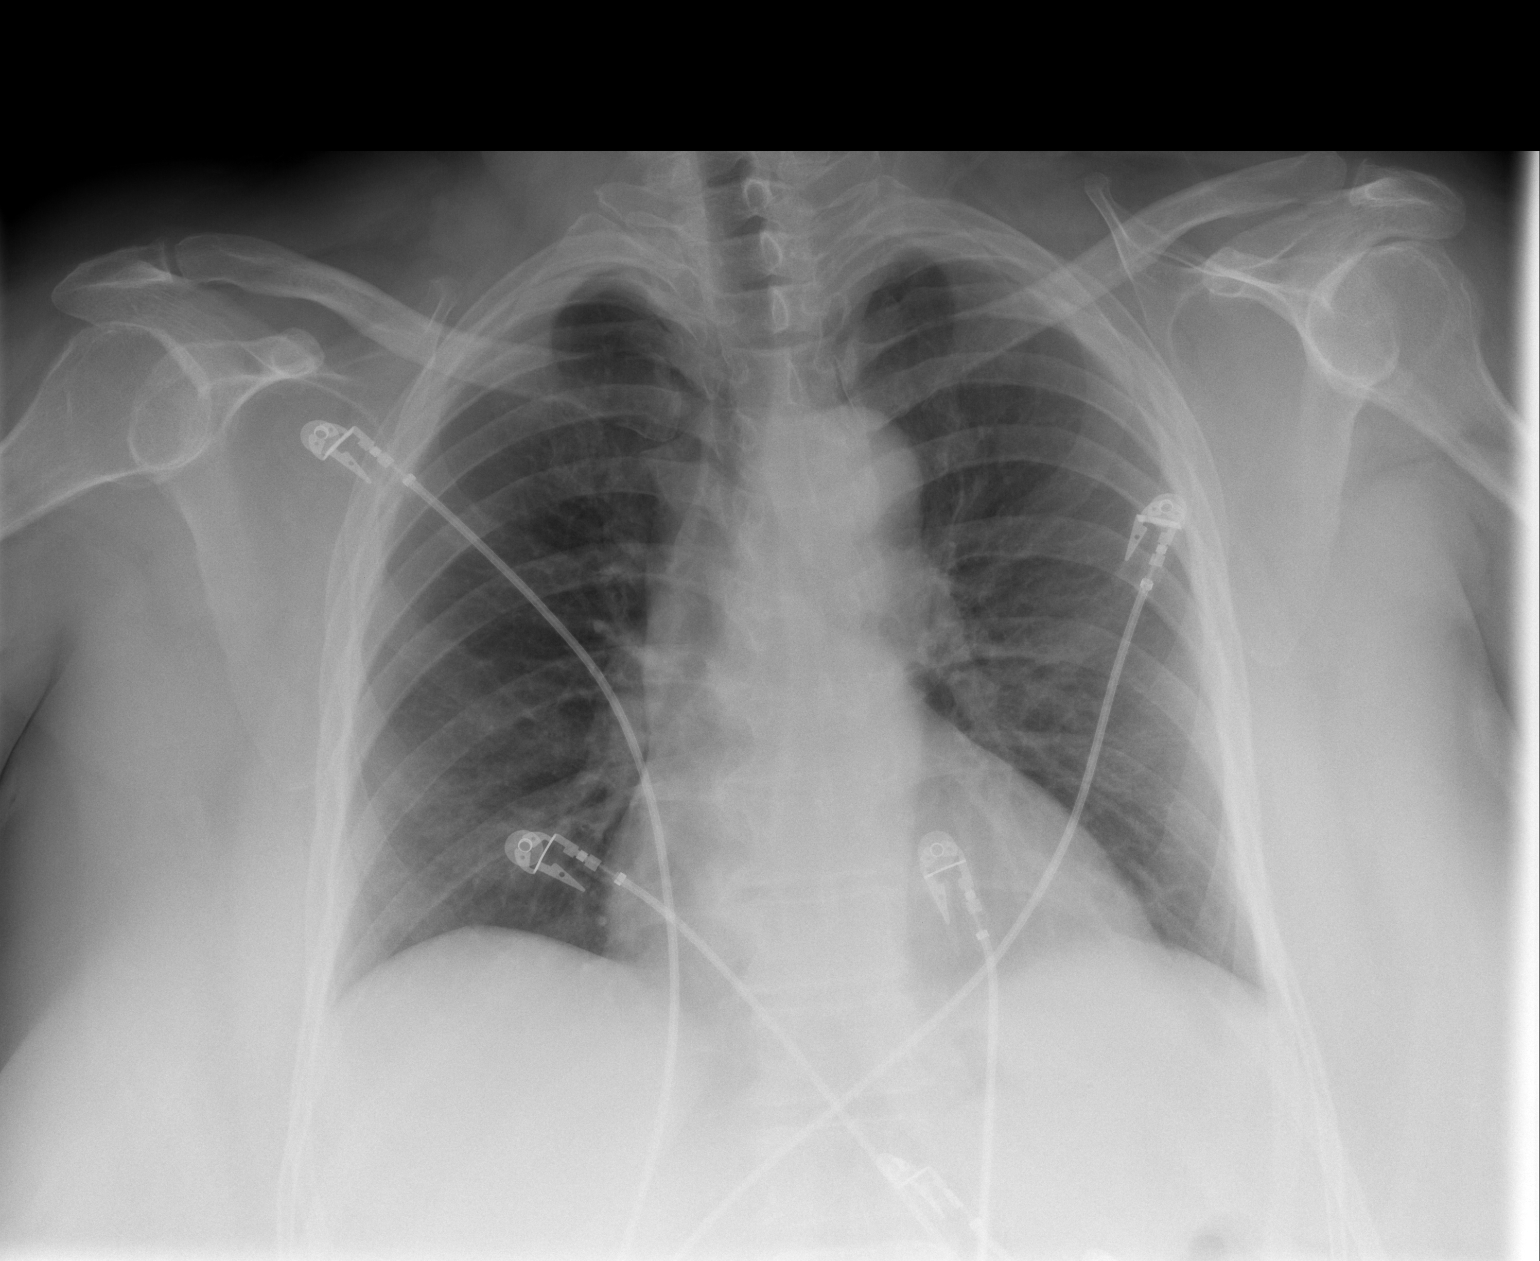

[w chest lat *]
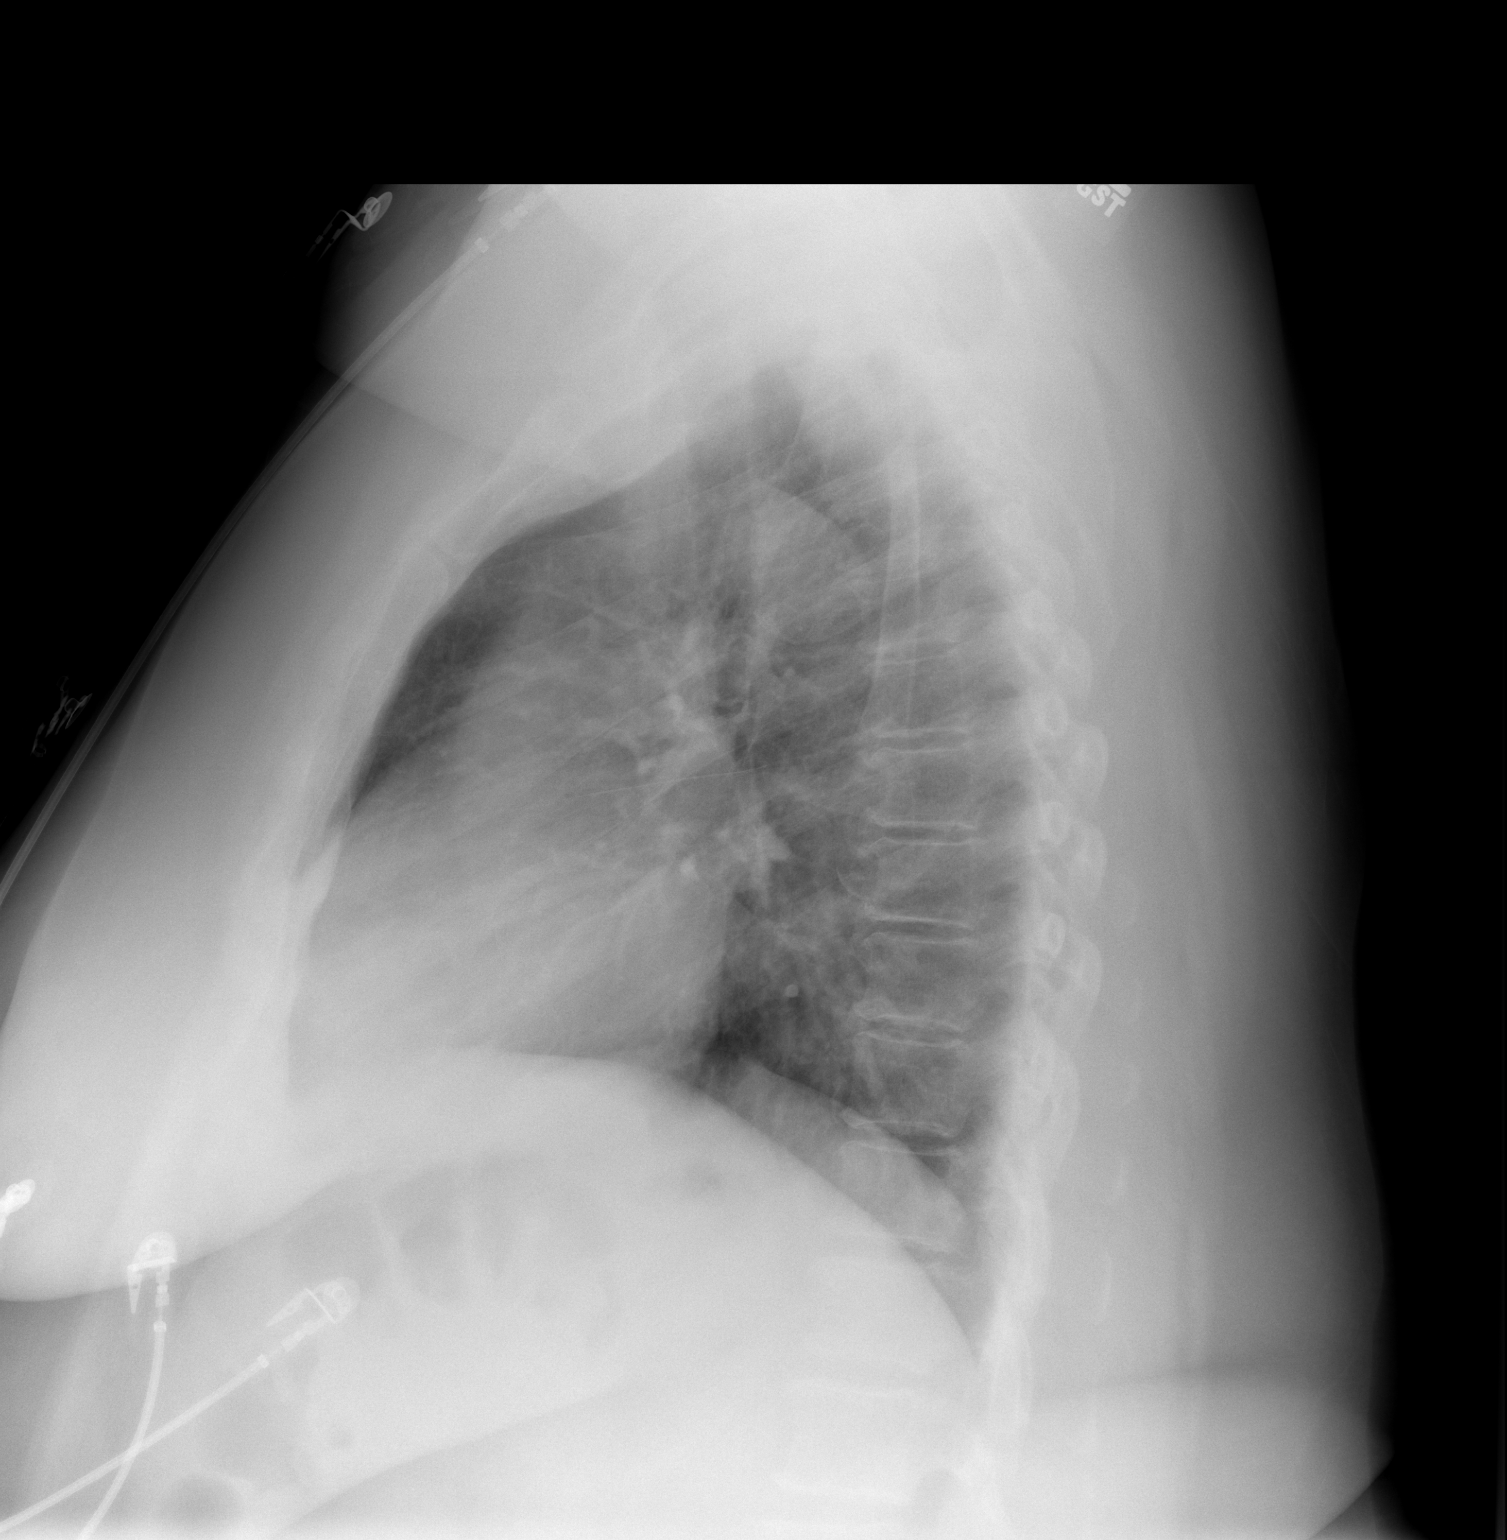

[2 of 2 positions shown; findings below may reference images not displayed]

FINDINGS: Stable cardiac size and mediastinal contour, within normal limits.  Stable mildly shallow lung volumes on both views.  No pneumothorax, pulmonary edema, or pleural effusion.  No focal airspace opacity.  No acute osseous abnormality identified.
IMPRESSION: No acute cardiopulmonary abnormality.

## 2008-11-28 ENCOUNTER — Encounter: Payer: Self-pay | Admitting: Internal Medicine

## 2008-11-28 ENCOUNTER — Encounter (INDEPENDENT_AMBULATORY_CARE_PROVIDER_SITE_OTHER): Payer: Self-pay | Admitting: Family Medicine

## 2008-12-01 ENCOUNTER — Encounter (INDEPENDENT_AMBULATORY_CARE_PROVIDER_SITE_OTHER): Payer: Self-pay | Admitting: Family Medicine

## 2008-12-05 IMAGING — CR DG CHEST 2V
2 series · 2 of 2 positions shown · non-contrast
Comparison: Chest, 2 views dated 07/31/07.

CLINICAL DATA: Chest pain. 
 CHEST - 2 VIEW:

[w chest pa]
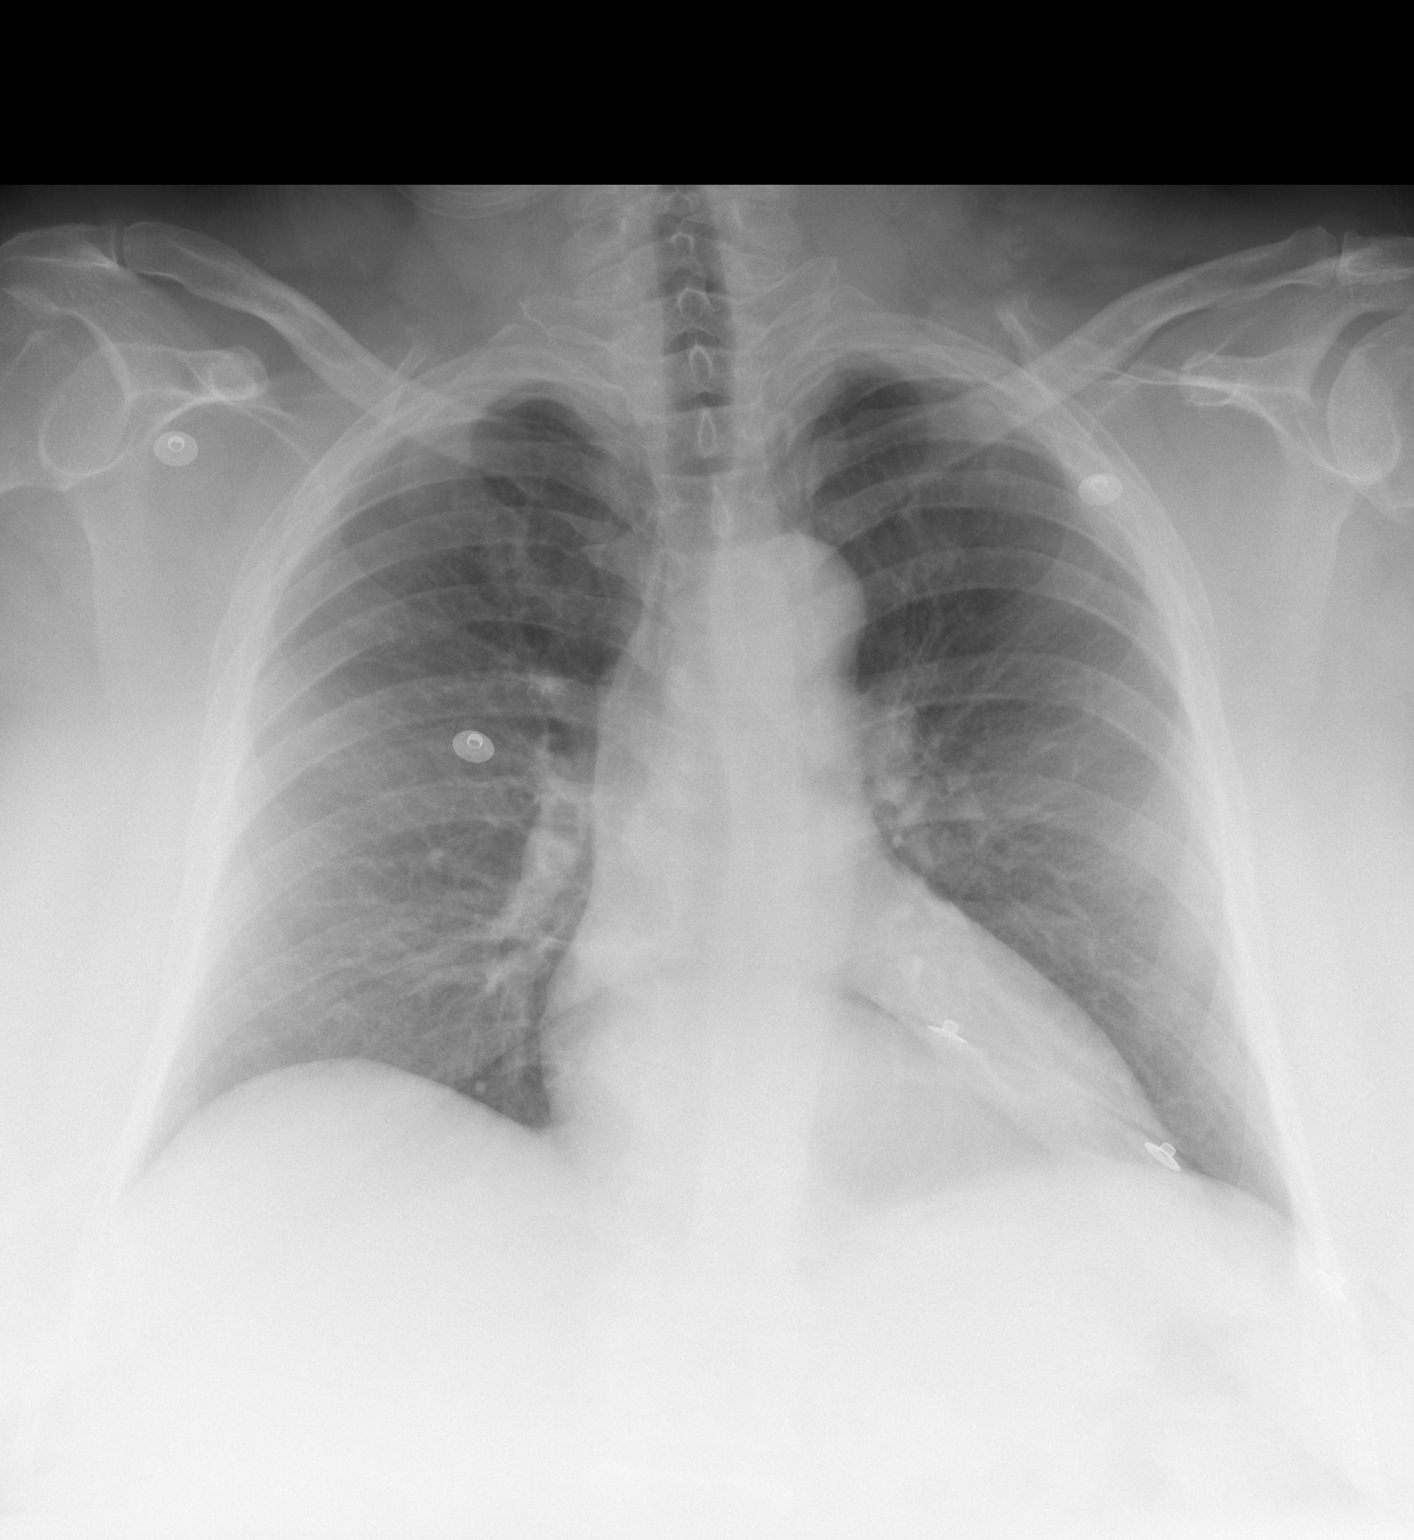

[w chest lat]
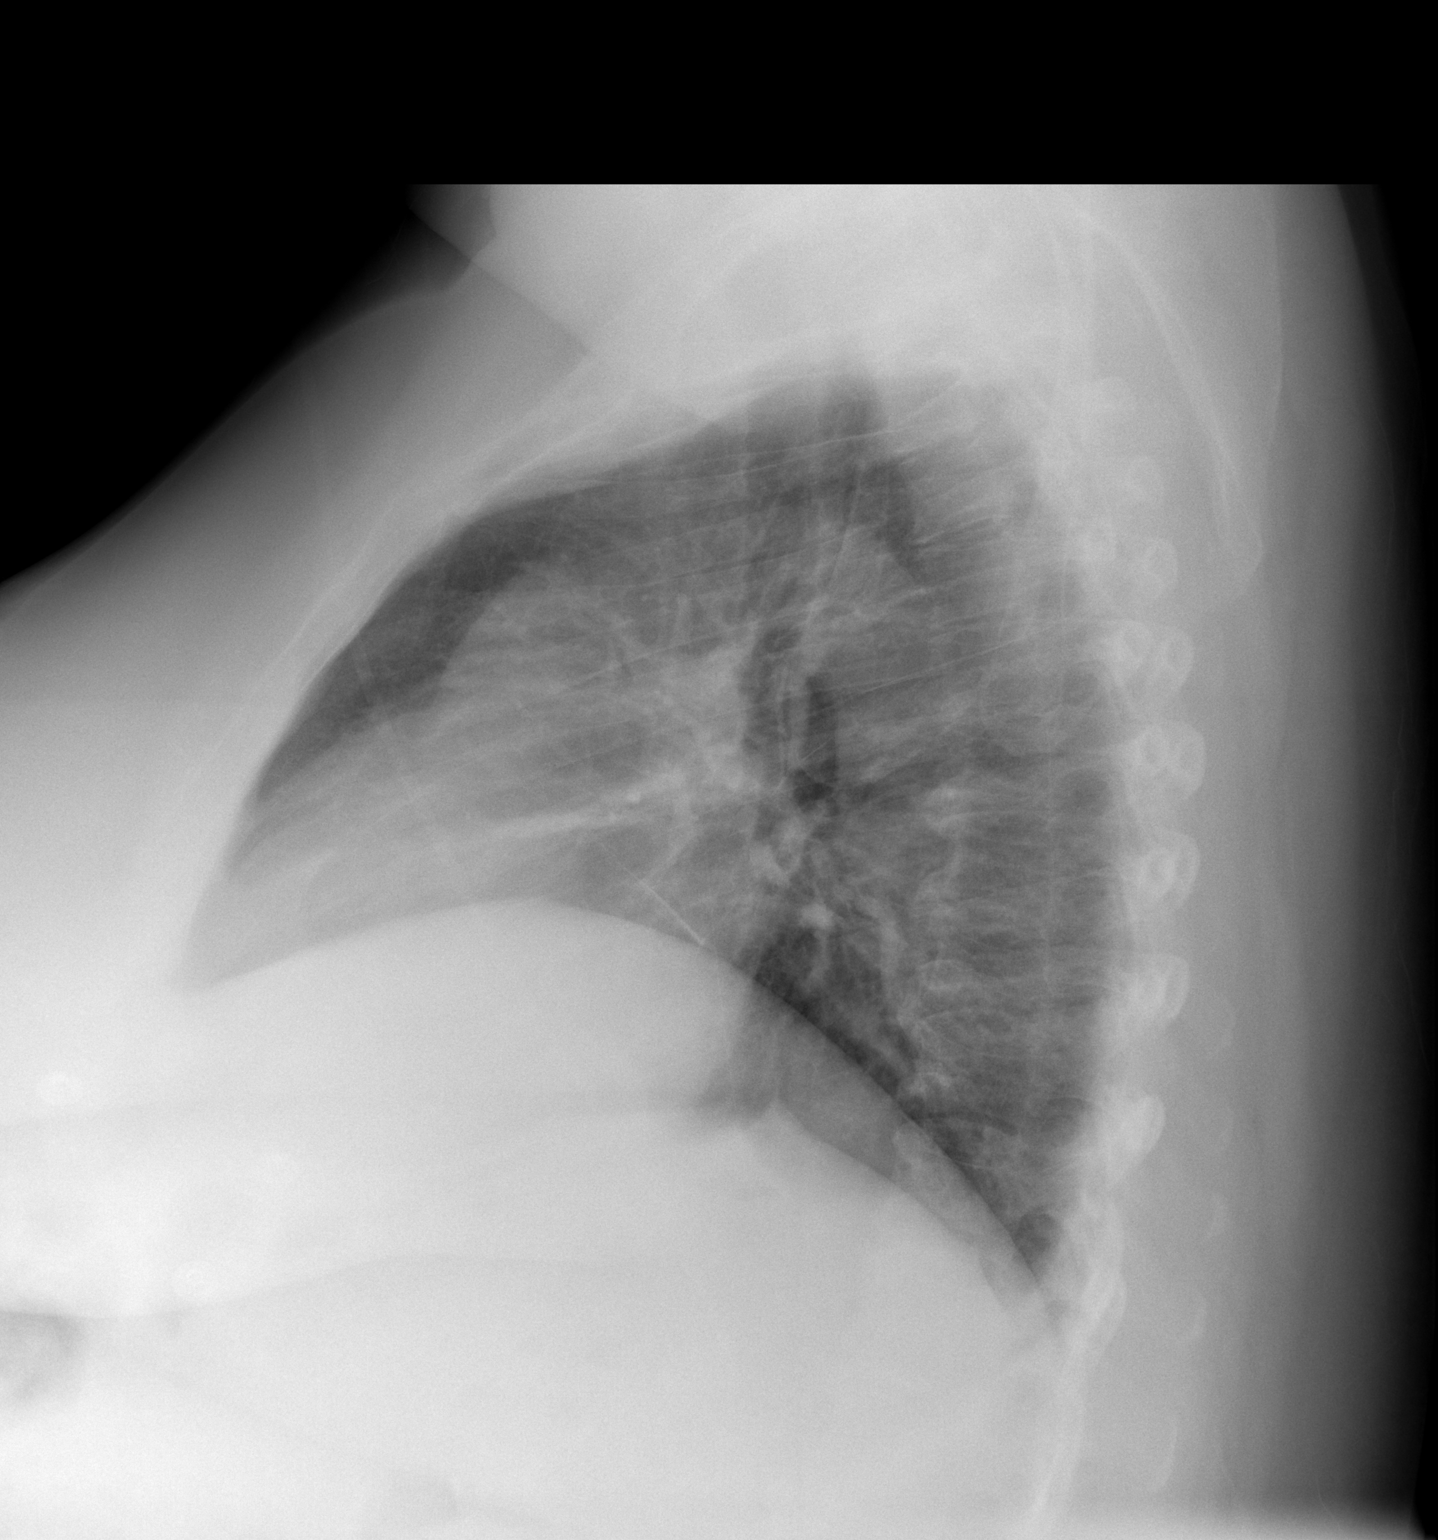

[2 of 2 positions shown; findings below may reference images not displayed]

FINDINGS: Stable mediastinum and cardiac silhouette.  There is mild central venous pulmonary congestion.  There are trace bilateral effusions.   No evidence of focal infiltrate.   No pneumothorax.
IMPRESSION: Mild central venous pulmonary congestion and trace pleural effusions.

## 2008-12-08 ENCOUNTER — Telehealth (INDEPENDENT_AMBULATORY_CARE_PROVIDER_SITE_OTHER): Payer: Self-pay | Admitting: Family Medicine

## 2008-12-24 ENCOUNTER — Emergency Department (HOSPITAL_COMMUNITY): Admission: EM | Admit: 2008-12-24 | Discharge: 2008-12-24 | Payer: Self-pay | Admitting: Emergency Medicine

## 2008-12-25 ENCOUNTER — Encounter (INDEPENDENT_AMBULATORY_CARE_PROVIDER_SITE_OTHER): Payer: Self-pay | Admitting: Family Medicine

## 2009-01-01 ENCOUNTER — Telehealth (INDEPENDENT_AMBULATORY_CARE_PROVIDER_SITE_OTHER): Payer: Self-pay | Admitting: Family Medicine

## 2009-01-25 IMAGING — RF DG UGI W/ HIGH DENSITY W/KUB
12 of 14 series · 19 of 24 positions shown · non-contrast
Comparison: None

CLINICAL DATA: Persistent vomiting

UPPER GI SERIES W/HIGH DENSITY W/KUB
TECHNIQUE: After obtaining a scout radiograph, upper GI series
performed with high density barium and effervescent agent. Thin
barium also used.

[Series 1: run · 2 of 17 slices shown (1 of 12)]
[im 1/17]
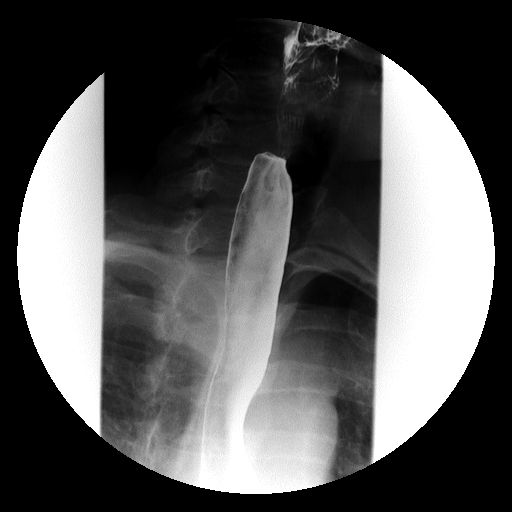
[im 9/17]
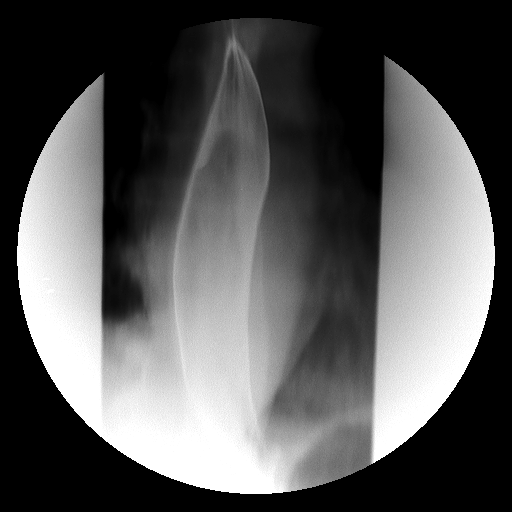

[Series 2: run · 1 of 1 slices shown (2 of 12)]
[im 1/1]
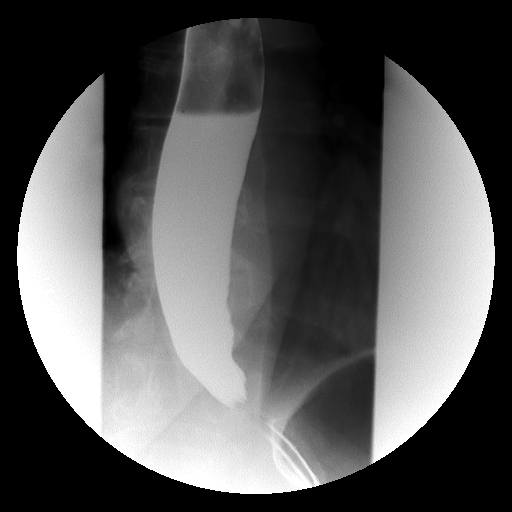

[Series 3: run · 1 of 1 slices shown (3 of 12)]
[im 1/1]
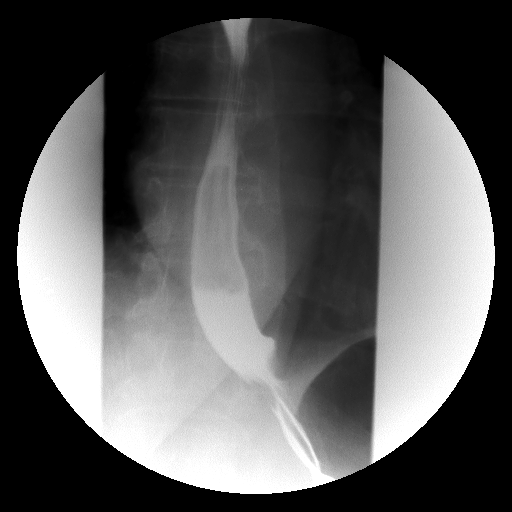

[Series 4: run · 1 of 1 slices shown (4 of 12)]
[im 1/1]
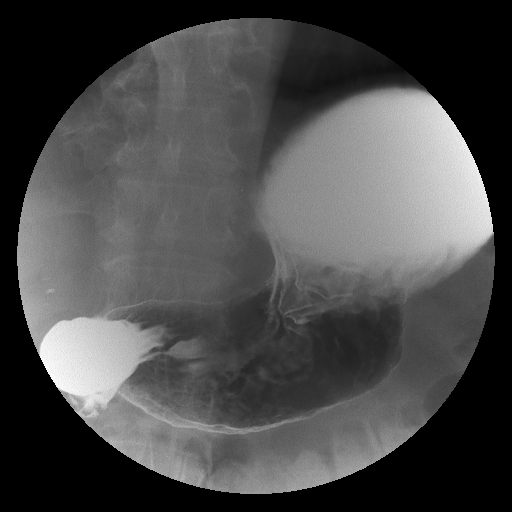

[Series 5: run · 1 of 1 slices shown (5 of 12)]
[im 1/1]
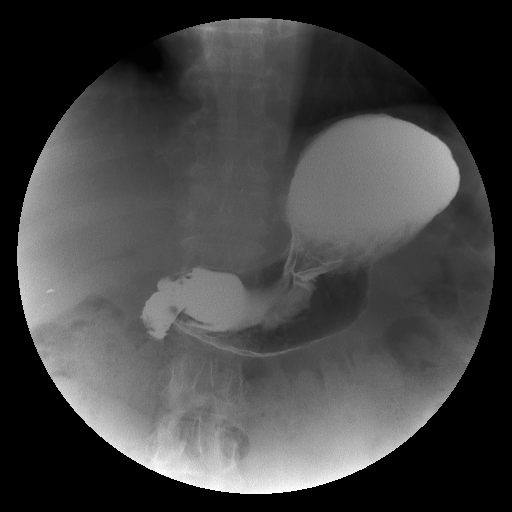

[Series 7: run · 1 of 1 slices shown (6 of 12)]
[im 1/1]
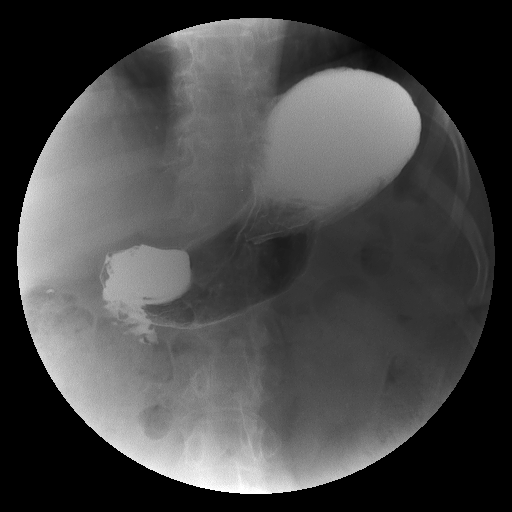

[Series 8: run · 7 of 29 slices shown (7 of 12)]
[im 1/29]
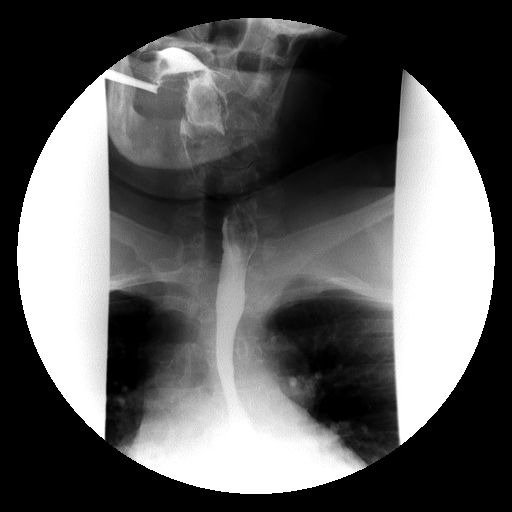
[im 4/29]
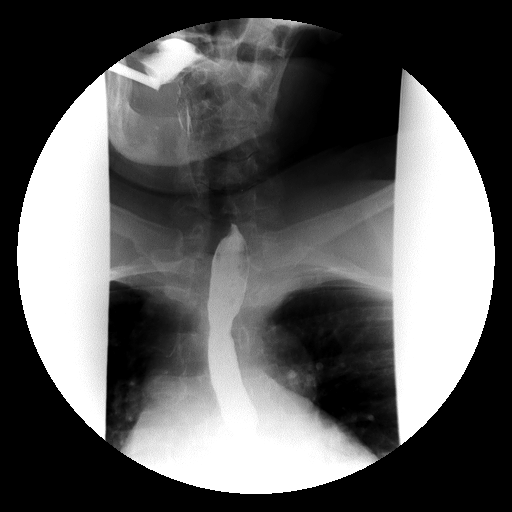
[im 11/29]
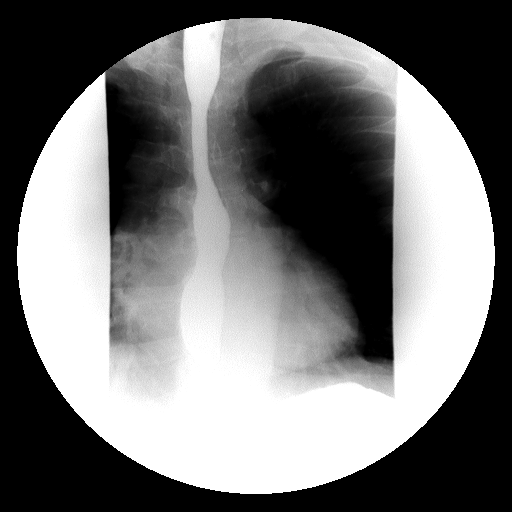
[im 15/29]
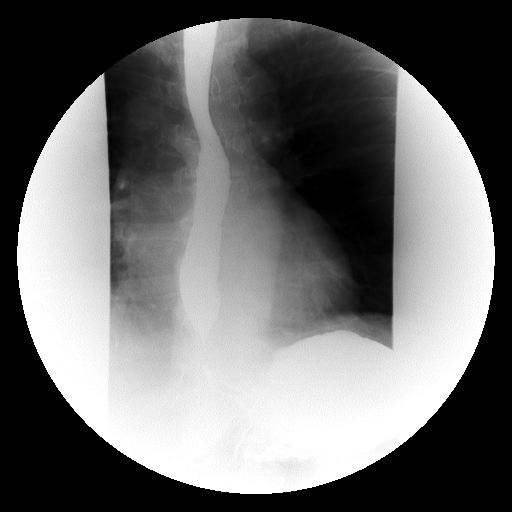
[im 18/29]
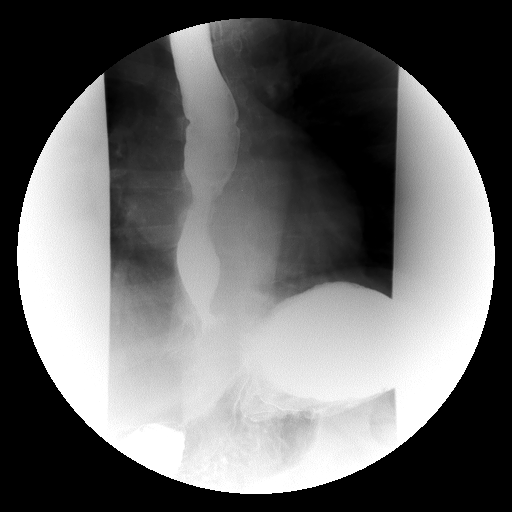
[im 22/29]
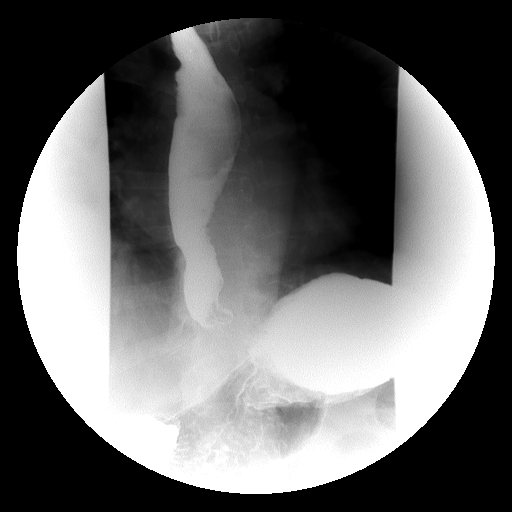
[im 29/29]
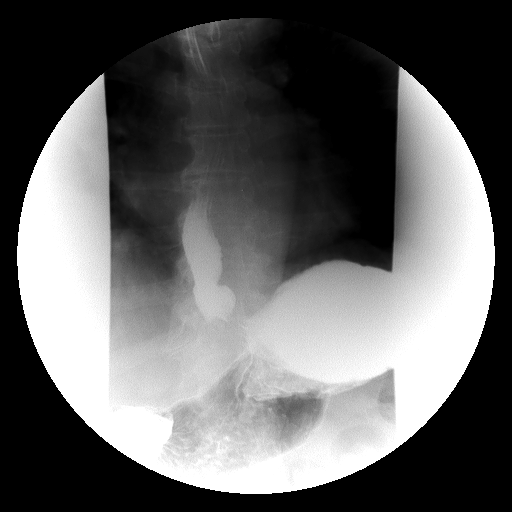

[Series 9: run · 1 of 2 slices shown (8 of 12)]
[im 1/2]
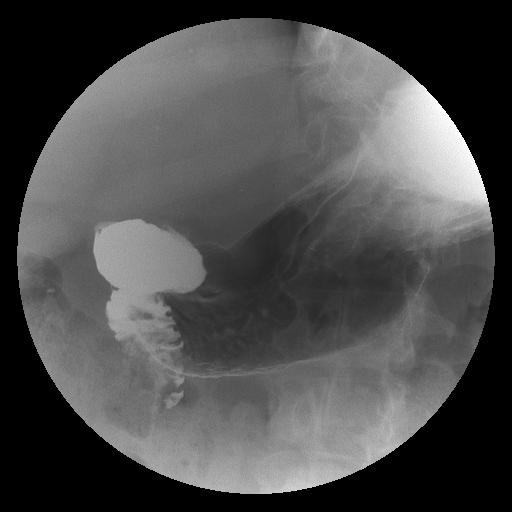

[Series 10: run · 1 of 1 slices shown (9 of 12)]
[im 1/1]
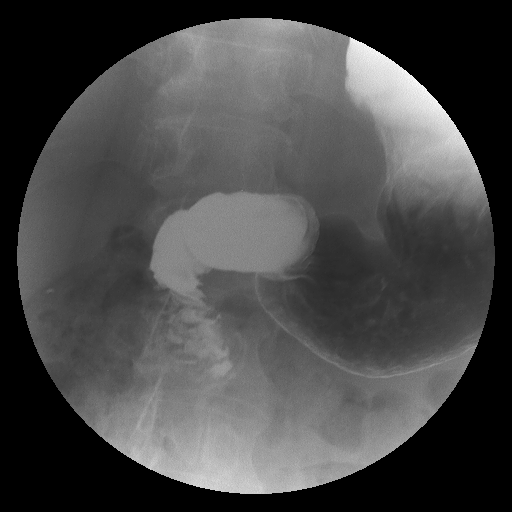

[Series 11: run · 1 of 1 slices shown (10 of 12)]
[im 1/1]
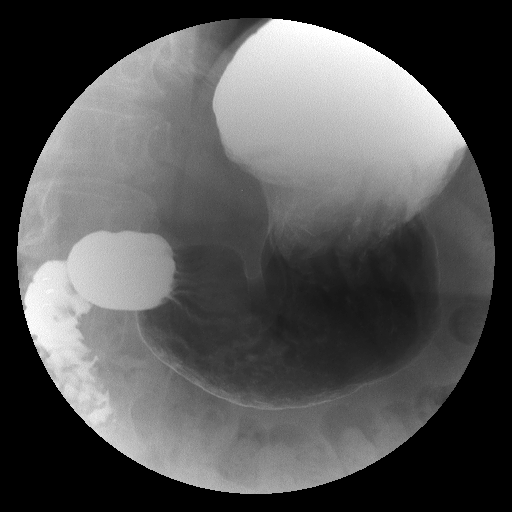

[Series 13: run · 1 of 1 slices shown (11 of 12)]
[im 1/1]
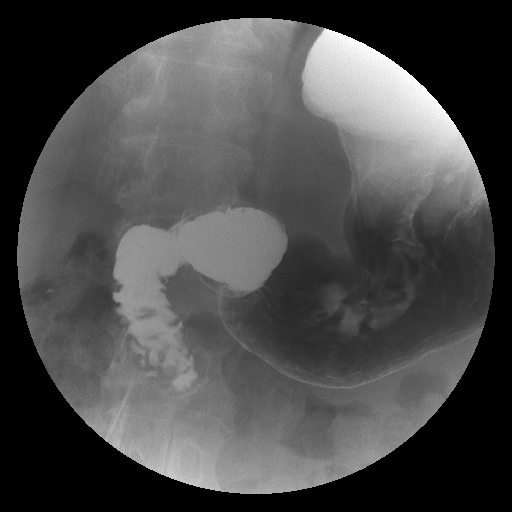

[Series 14: run · 1 of 1 slices shown (12 of 12)]
[im 1/1]
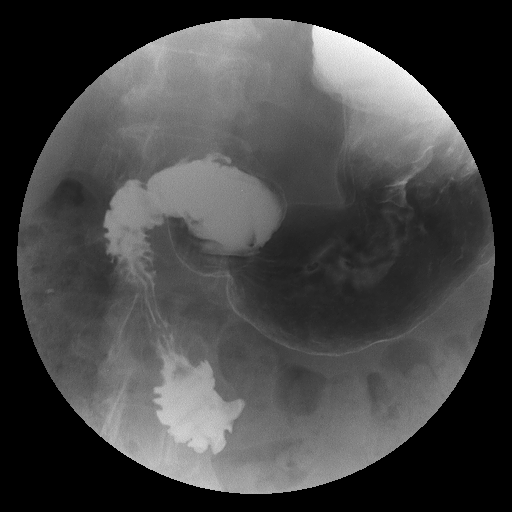

[19 of 24 positions shown; findings below may reference images not displayed]

FINDINGS: The study was limited due to morbid obesity and limited
mobility of the patient.

Preliminary film of the abdomen reveals and non obstructive bowel
gas pattern and no acute bony abnormality.  The esophageal mucosa
and motility are normal.  There is no stricture or reflux
identified.  No significant hiatal hernia is identified.  The
stomach appears normal, the duodenal bulb is normal.  There is no
ulcer or mass or bowel edema.
IMPRESSION: No significant abnormality is detected.

## 2009-01-26 ENCOUNTER — Emergency Department (HOSPITAL_COMMUNITY): Admission: EM | Admit: 2009-01-26 | Discharge: 2009-01-26 | Payer: Self-pay | Admitting: Internal Medicine

## 2009-02-02 ENCOUNTER — Telehealth: Payer: Self-pay | Admitting: Physician Assistant

## 2009-02-17 IMAGING — CR DG CHEST 1V PORT
1 series · 1 of 1 positions shown · non-contrast
Comparison: 08/08/2007

CLINICAL DATA: Chest pain

PORTABLE CHEST - 1 VIEW

[AP]
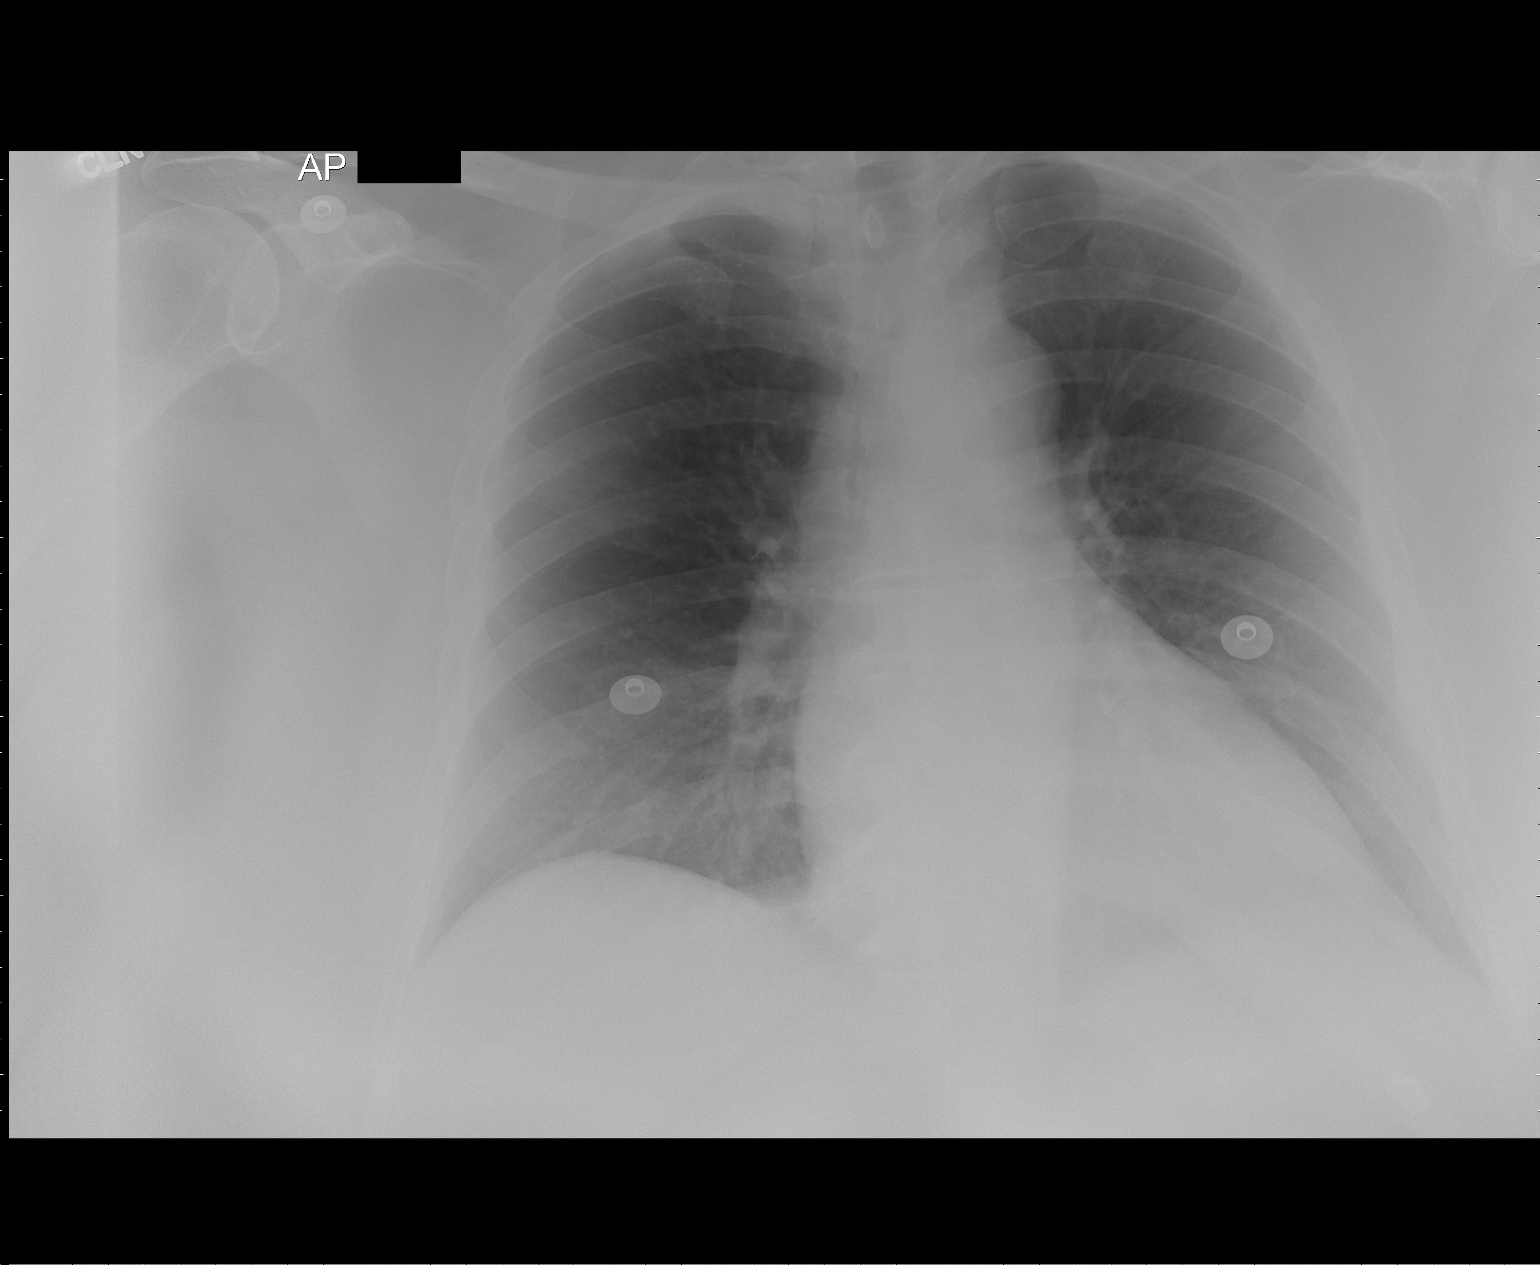

[1 of 1 positions shown; findings below may reference images not displayed]

FINDINGS: Stable heart size.  No edema, infiltrate or pleural
effusion.
IMPRESSION: No active disease.

## 2009-02-24 ENCOUNTER — Encounter: Payer: Self-pay | Admitting: Physician Assistant

## 2009-02-27 ENCOUNTER — Emergency Department (HOSPITAL_COMMUNITY): Admission: EM | Admit: 2009-02-27 | Discharge: 2009-02-27 | Payer: Self-pay | Admitting: Emergency Medicine

## 2009-03-23 ENCOUNTER — Emergency Department (HOSPITAL_COMMUNITY): Admission: EM | Admit: 2009-03-23 | Discharge: 2009-03-23 | Payer: Self-pay | Admitting: Emergency Medicine

## 2009-03-23 ENCOUNTER — Encounter: Payer: Self-pay | Admitting: Physician Assistant

## 2009-03-26 ENCOUNTER — Ambulatory Visit: Payer: Self-pay | Admitting: Physician Assistant

## 2009-03-26 DIAGNOSIS — E669 Obesity, unspecified: Secondary | ICD-10-CM

## 2009-03-26 LAB — CONVERTED CEMR LAB
Nitrite: POSITIVE
Specific Gravity, Urine: 1.01
Urobilinogen, UA: 4
pH: 7

## 2009-03-27 ENCOUNTER — Encounter: Payer: Self-pay | Admitting: Physician Assistant

## 2009-04-08 ENCOUNTER — Encounter: Payer: Self-pay | Admitting: Physician Assistant

## 2009-04-13 ENCOUNTER — Telehealth: Payer: Self-pay | Admitting: Physician Assistant

## 2009-04-23 ENCOUNTER — Encounter: Payer: Self-pay | Admitting: Physician Assistant

## 2009-04-23 LAB — CONVERTED CEMR LAB
Hemoglobin: 12.9 g/dL
Platelets: 194 10*3/uL

## 2009-04-26 ENCOUNTER — Emergency Department (HOSPITAL_COMMUNITY): Admission: EM | Admit: 2009-04-26 | Discharge: 2009-04-26 | Payer: Self-pay | Admitting: Emergency Medicine

## 2009-05-04 ENCOUNTER — Encounter: Payer: Self-pay | Admitting: Physician Assistant

## 2009-05-10 ENCOUNTER — Encounter: Payer: Self-pay | Admitting: Physician Assistant

## 2009-05-19 ENCOUNTER — Emergency Department (HOSPITAL_COMMUNITY): Admission: EM | Admit: 2009-05-19 | Discharge: 2009-05-20 | Payer: Self-pay | Admitting: Emergency Medicine

## 2009-05-19 ENCOUNTER — Encounter: Payer: Self-pay | Admitting: Physician Assistant

## 2009-05-26 IMAGING — CR DG CHEST 1V PORT
1 series · 1 of 1 positions shown · non-contrast
Comparison: Portable chest 10/21/2007.

CLINICAL DATA: Chest pain.

PORTABLE CHEST - 1 VIEW

[view not recorded]
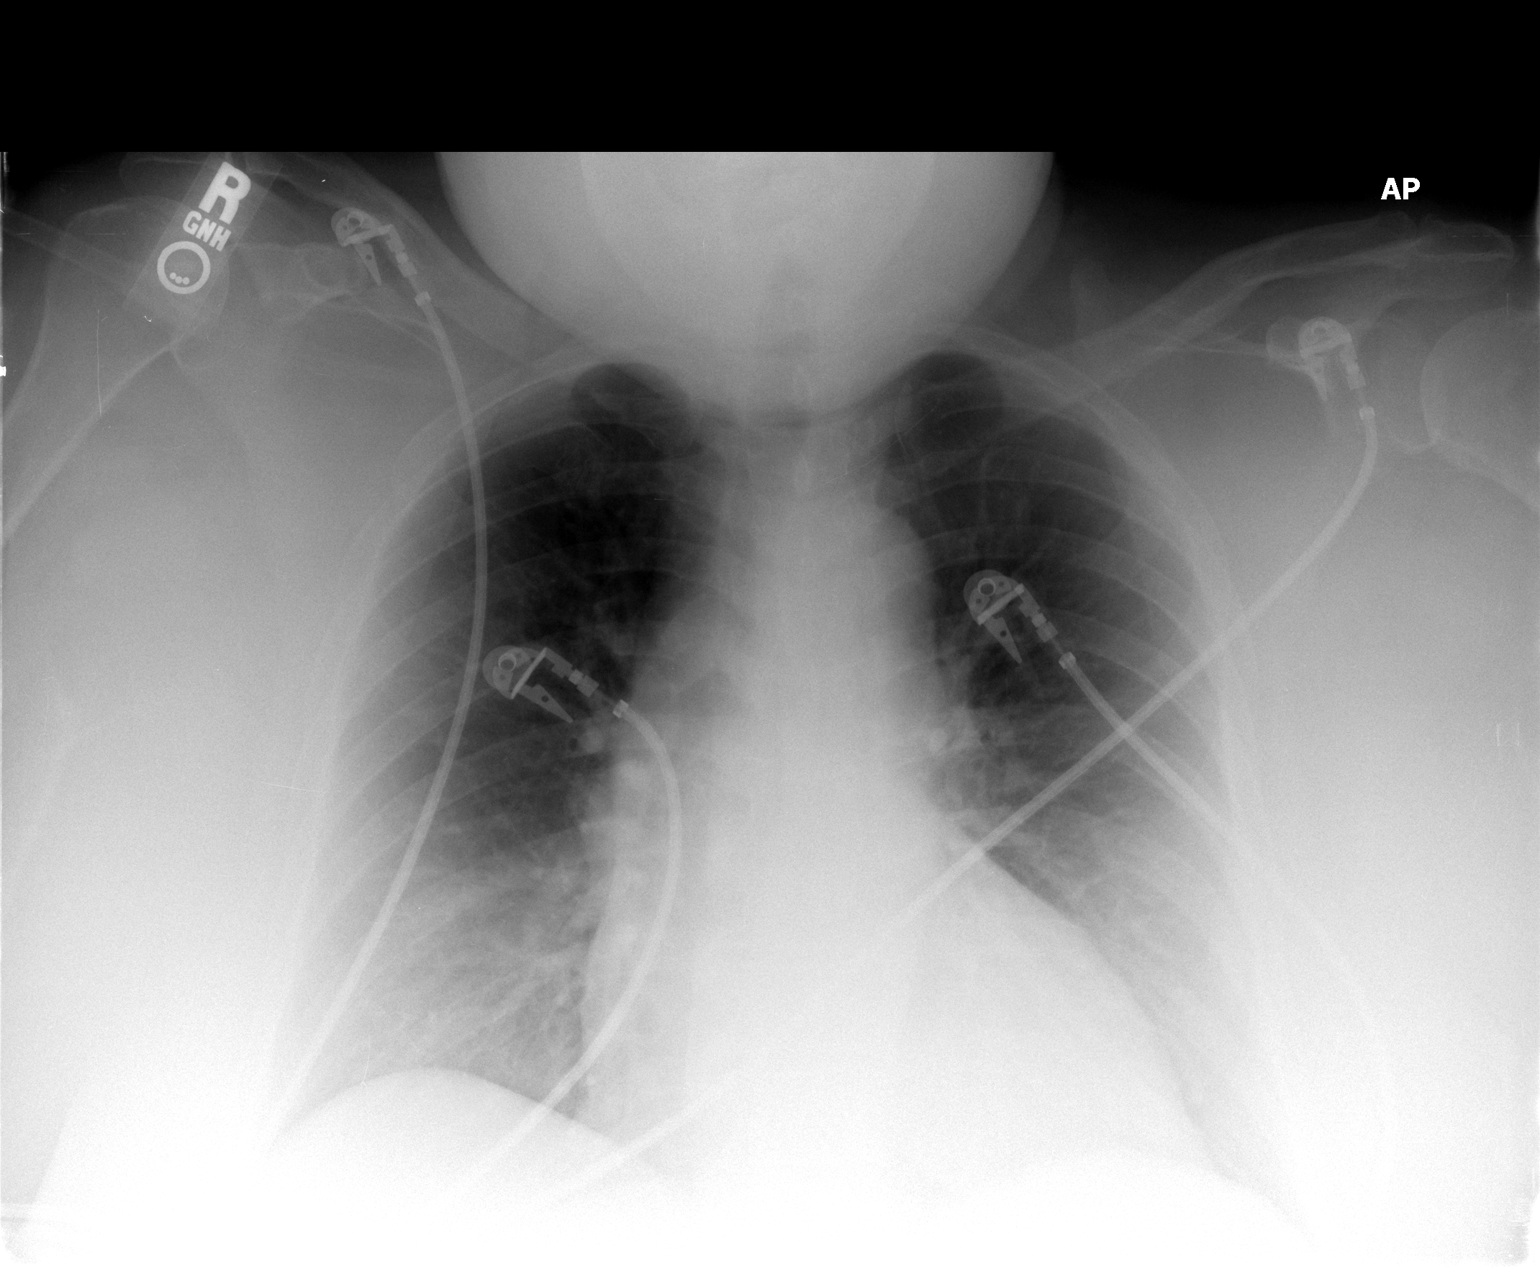

[1 of 1 positions shown; findings below may reference images not displayed]

FINDINGS: Lungs are clear.  Heart size upper normal.  No pleural
effusion or focal bony abnormality.
IMPRESSION: No acute disease.

## 2009-05-27 ENCOUNTER — Ambulatory Visit: Payer: Self-pay | Admitting: Physician Assistant

## 2009-05-27 DIAGNOSIS — R82998 Other abnormal findings in urine: Secondary | ICD-10-CM | POA: Insufficient documentation

## 2009-05-27 DIAGNOSIS — M1710 Unilateral primary osteoarthritis, unspecified knee: Secondary | ICD-10-CM

## 2009-05-27 LAB — CONVERTED CEMR LAB
ALT: 14 units/L (ref 0–35)
Alkaline Phosphatase: 48 units/L (ref 39–117)
Nitrite: NEGATIVE
Sodium: 140 meq/L (ref 135–145)
Specific Gravity, Urine: <1.005
Total Bilirubin: 0.3 mg/dL (ref 0.3–1.2)
Total Protein: 7.2 g/dL (ref 6.0–8.3)
WBC Urine, dipstick: NEGATIVE

## 2009-05-28 ENCOUNTER — Encounter: Payer: Self-pay | Admitting: Physician Assistant

## 2009-06-18 ENCOUNTER — Encounter: Admission: RE | Admit: 2009-06-18 | Discharge: 2009-06-18 | Payer: Self-pay | Admitting: Internal Medicine

## 2009-06-30 IMAGING — CR DG CHEST 2V
2 series · 2 of 2 positions shown · non-contrast
Comparison: 01/27/2008

CLINICAL DATA: Chest pain

CHEST - 2 VIEW

[w chest pa]
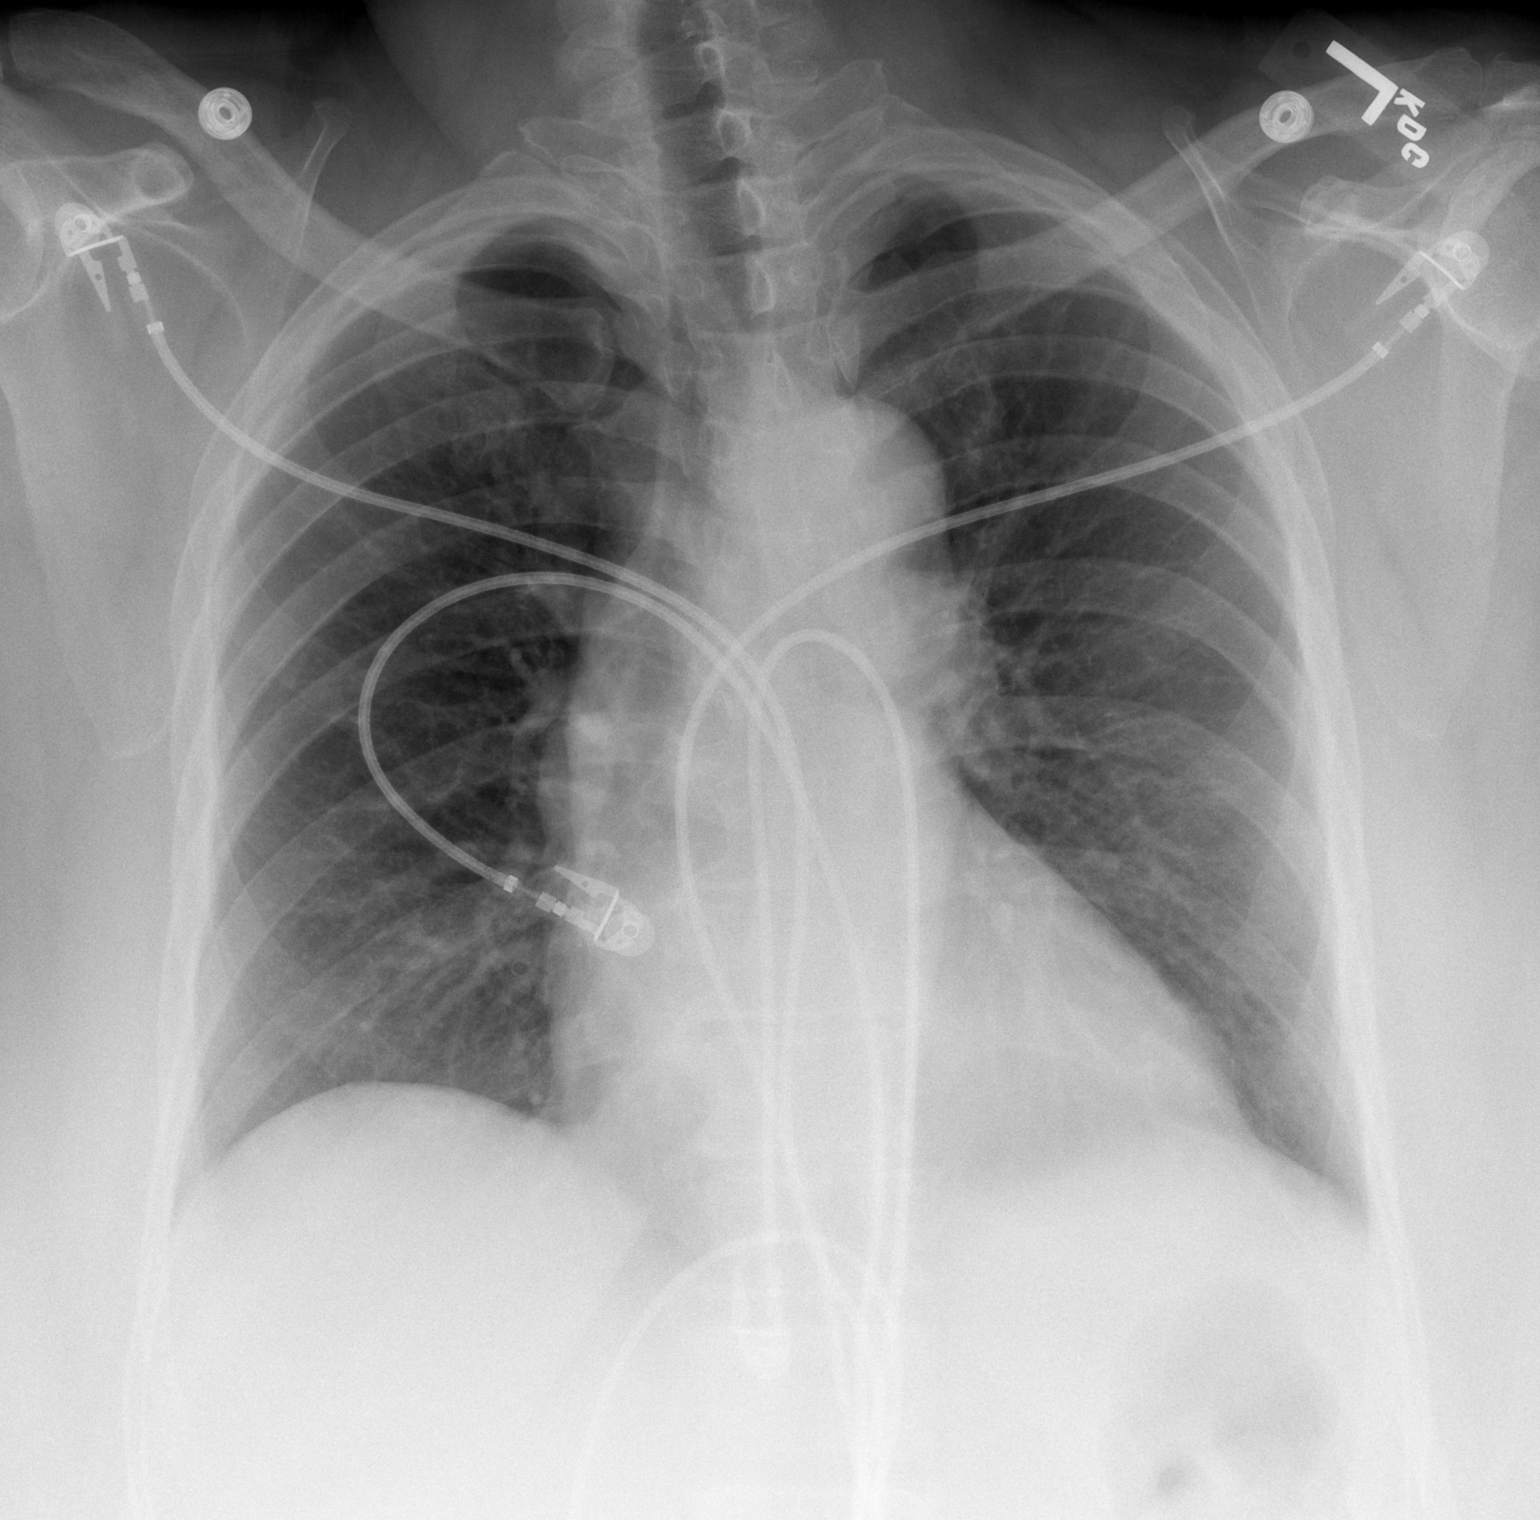

[w chest lat]
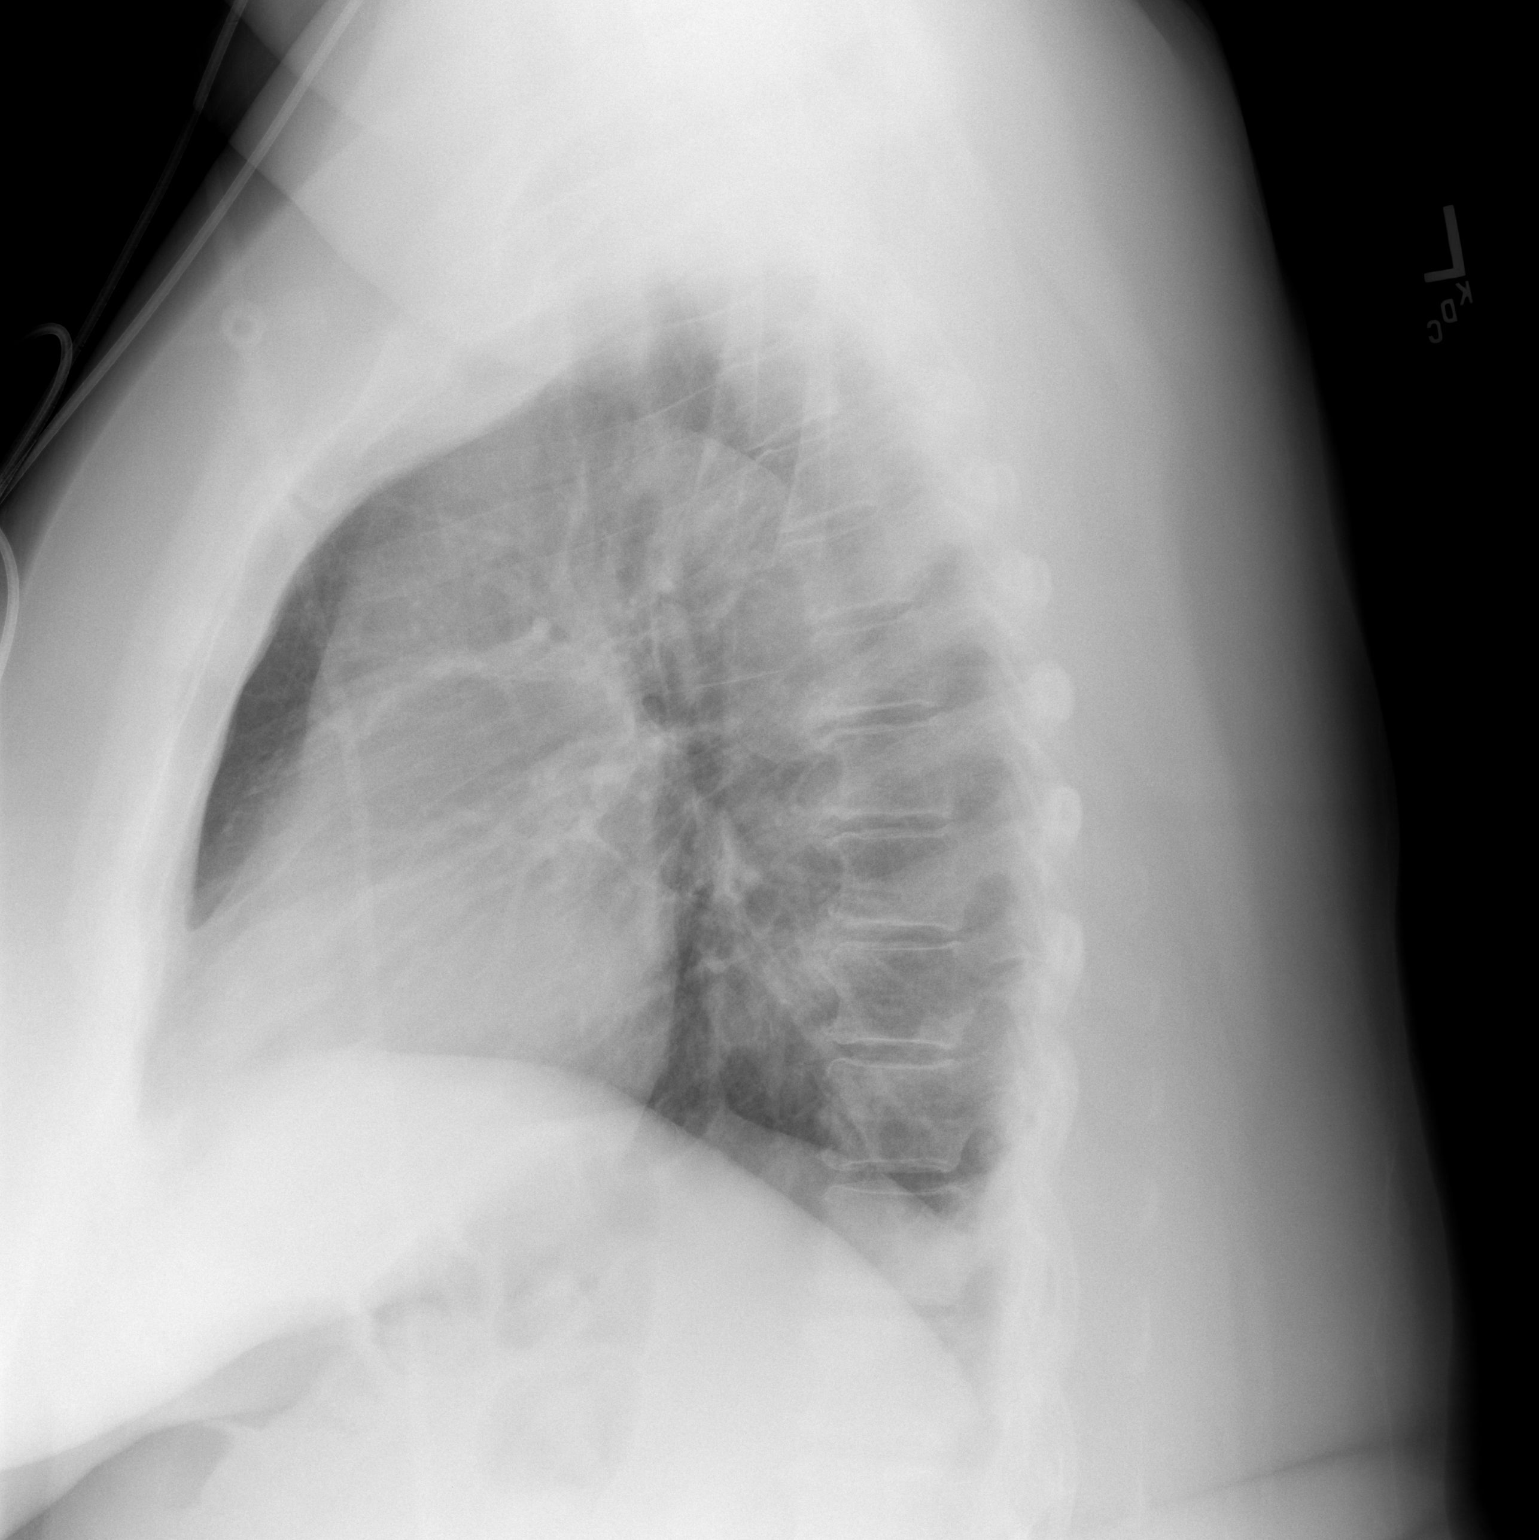

[2 of 2 positions shown; findings below may reference images not displayed]

FINDINGS: The lungs are clear.  Mild peribronchial thickening is
chronic.  The heart is upper normal.  The mediastinum and hila are
negative for adenopathy.
IMPRESSION: Stable chest.

## 2009-07-06 ENCOUNTER — Ambulatory Visit: Payer: Self-pay | Admitting: Physician Assistant

## 2009-07-06 LAB — CONVERTED CEMR LAB
Blood in Urine, dipstick: NEGATIVE
Nitrite: NEGATIVE
Protein, U semiquant: NEGATIVE
WBC Urine, dipstick: NEGATIVE

## 2009-07-09 ENCOUNTER — Encounter: Payer: Self-pay | Admitting: Physician Assistant

## 2009-07-10 ENCOUNTER — Encounter (INDEPENDENT_AMBULATORY_CARE_PROVIDER_SITE_OTHER): Payer: Self-pay | Admitting: Internal Medicine

## 2009-07-10 ENCOUNTER — Observation Stay (HOSPITAL_COMMUNITY): Admission: EM | Admit: 2009-07-10 | Discharge: 2009-07-10 | Payer: Self-pay | Admitting: Emergency Medicine

## 2009-07-13 ENCOUNTER — Encounter: Payer: Self-pay | Admitting: Physician Assistant

## 2009-07-26 IMAGING — CR DG CHEST 2V
2 series · 2 of 2 positions shown · non-contrast
Comparison: 03/02/2008 and earlier.

CLINICAL DATA: 56-year-old female with chest pain and headache.

CHEST - 2 VIEW

[w chest pa]
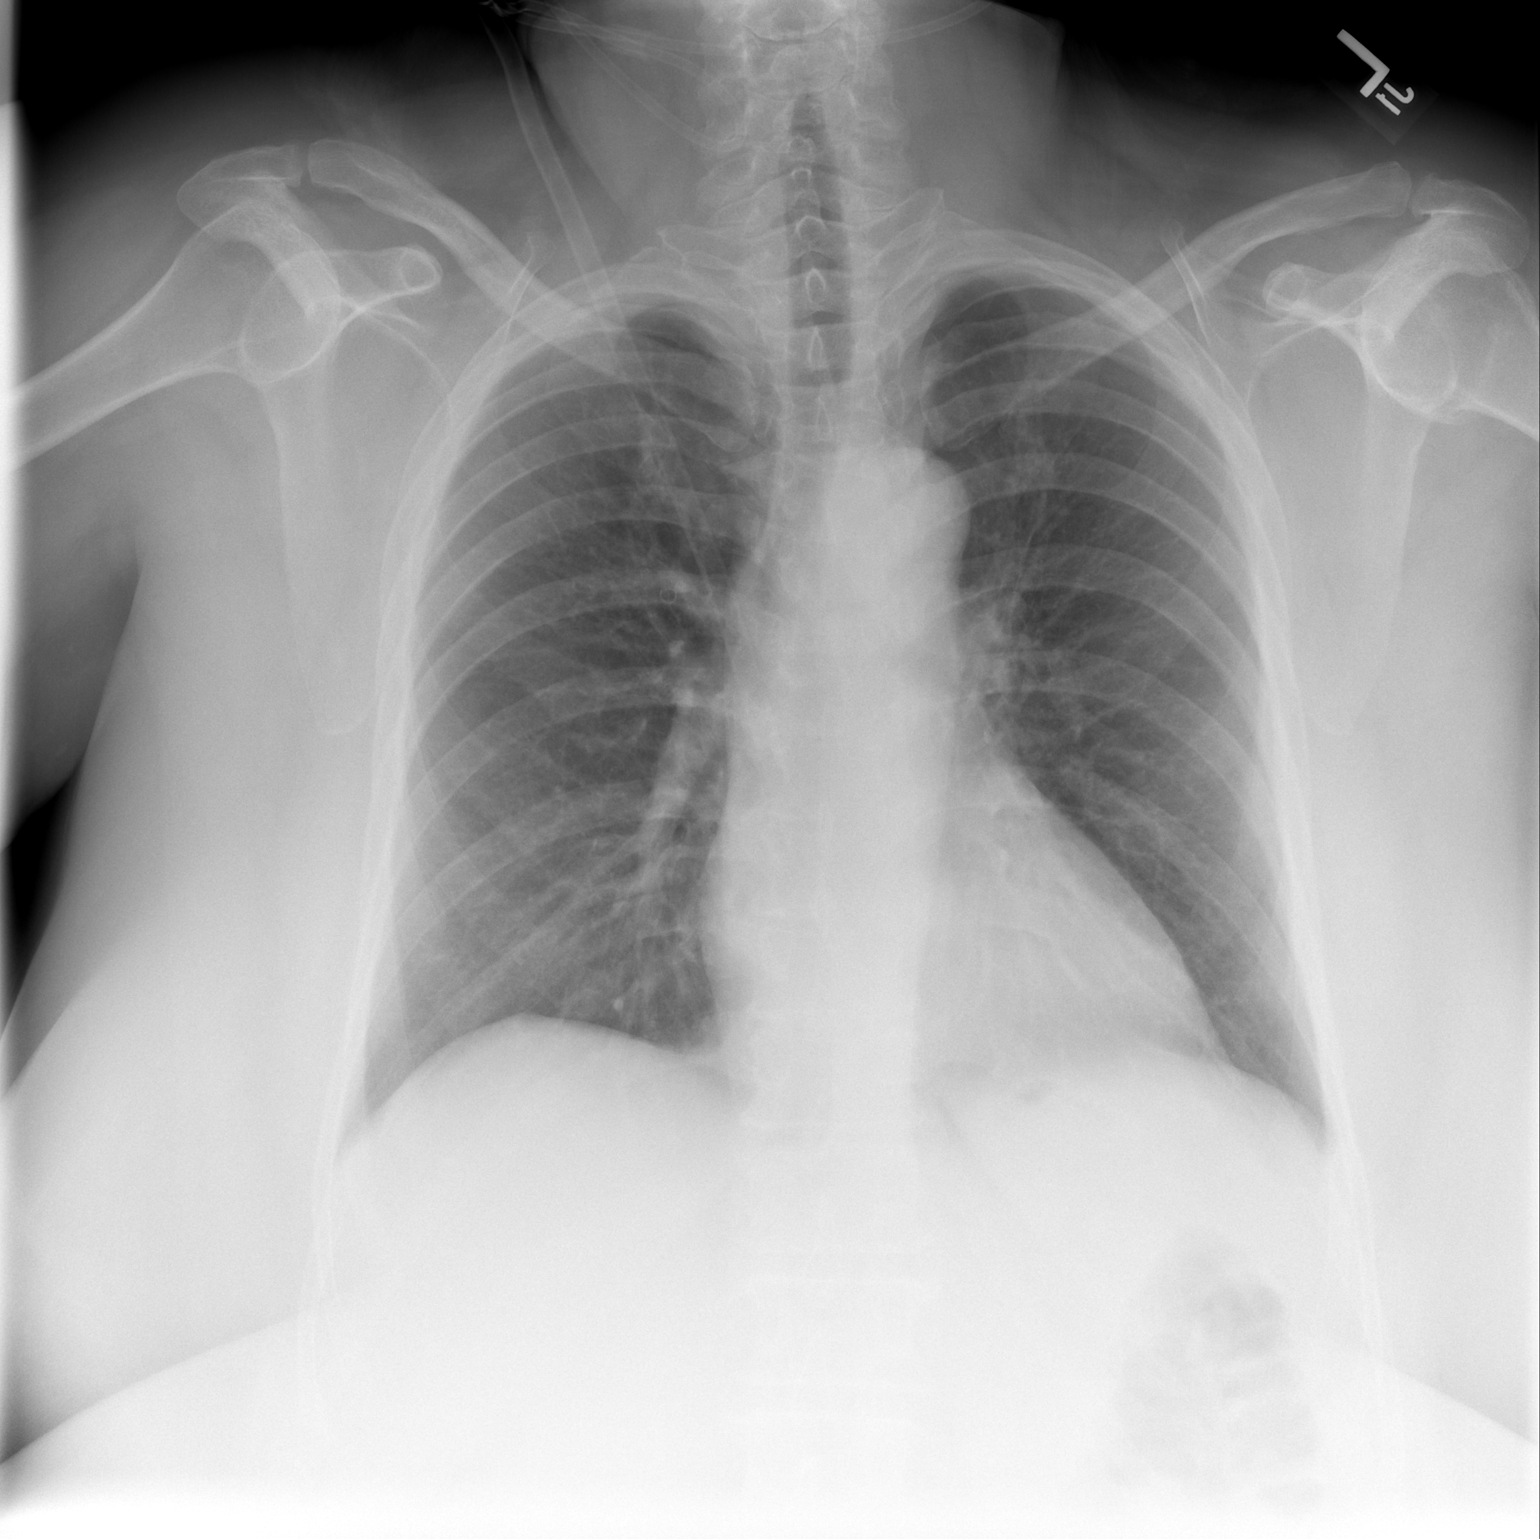

[w chest lat]
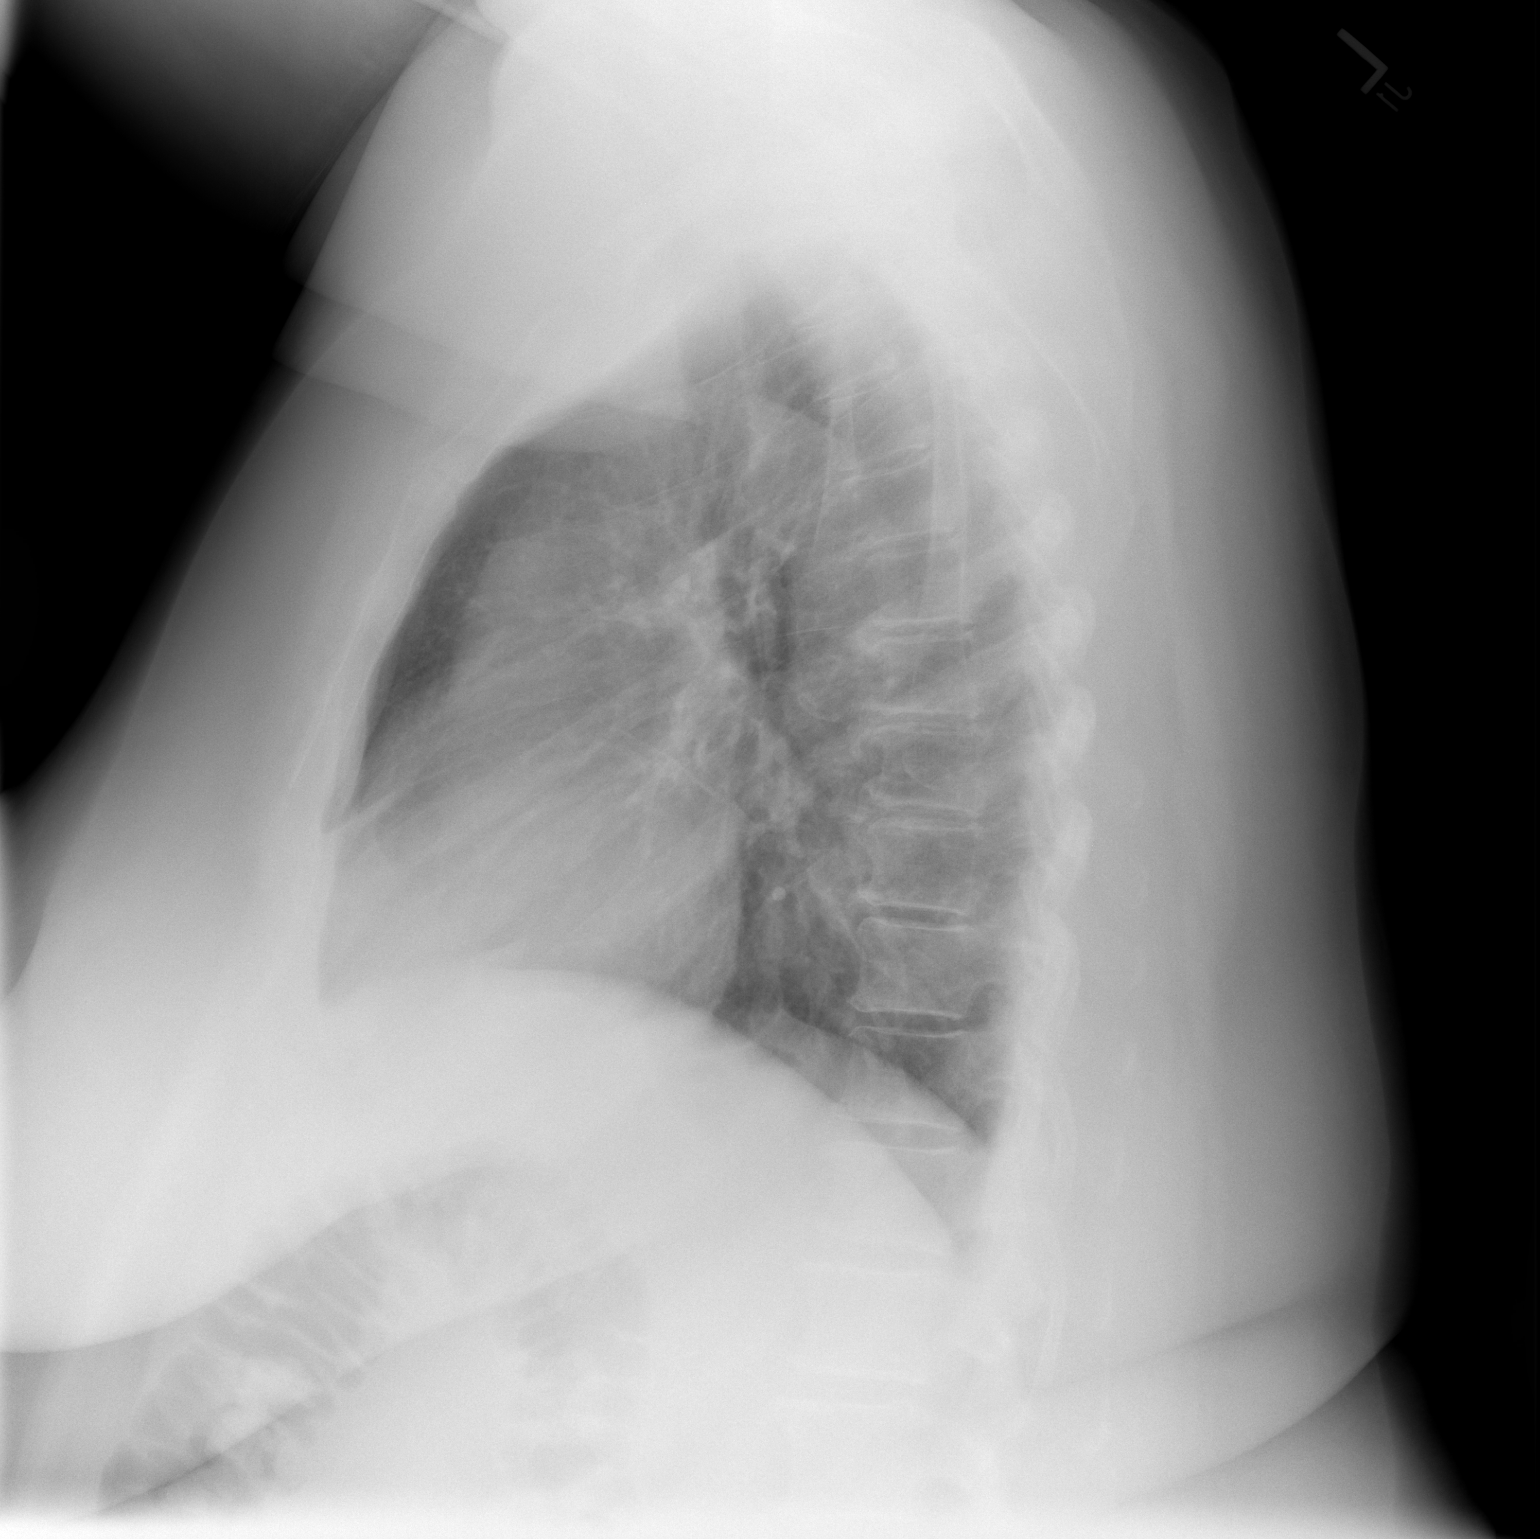

[2 of 2 positions shown; findings below may reference images not displayed]

FINDINGS: Stable cardiac size and mediastinal contours.  Cardiac
size is at the upper limits of normal.  Stable lung volumes.  No
pneumothorax, pulmonary edema or pleural effusion.  No
consolidation or confluent airspace opacity. Stable visualized
osseous structures.  Tracheal air column is within normal limits.
IMPRESSION: No acute cardiopulmonary abnormality.

## 2009-08-06 ENCOUNTER — Emergency Department (HOSPITAL_COMMUNITY): Admission: EM | Admit: 2009-08-06 | Discharge: 2009-08-07 | Payer: Self-pay | Admitting: Emergency Medicine

## 2009-08-06 ENCOUNTER — Encounter: Payer: Self-pay | Admitting: Physician Assistant

## 2009-08-14 ENCOUNTER — Telehealth: Payer: Self-pay | Admitting: Physician Assistant

## 2009-08-14 IMAGING — CT CT ANGIO CHEST
2 of 7 series · 19 of 36 positions shown · IV contrast (APPLIED)
Comparison: None

CLINICAL DATA: Chest pain.

CT ANGIOGRAPHY CHEST
TECHNIQUE: Multidetector CT imaging of the chest using the
standard protocol during bolus administration of intravenous
contrast. Multiplanar reconstructed images including MIPs were
obtained and reviewed to evaluate the vascular anatomy.
Contrast: 66 ml Emnipaque-PLD

[Series 13: pulm embolism 1.0 b25f thins · axial · 0.75mm/px · z∈[-12,+216]mm · 18 of 254 slices shown]
[im 13/254  lung]
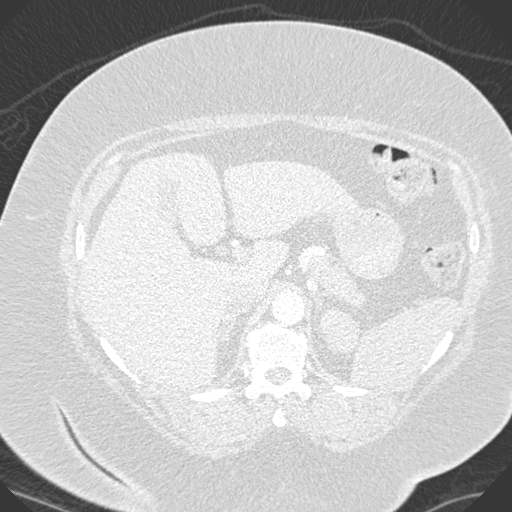
[im 26/254  mediastinal]
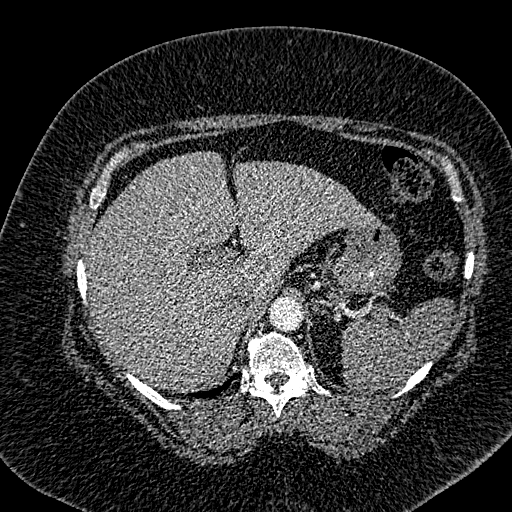
[im 38/254  lung]
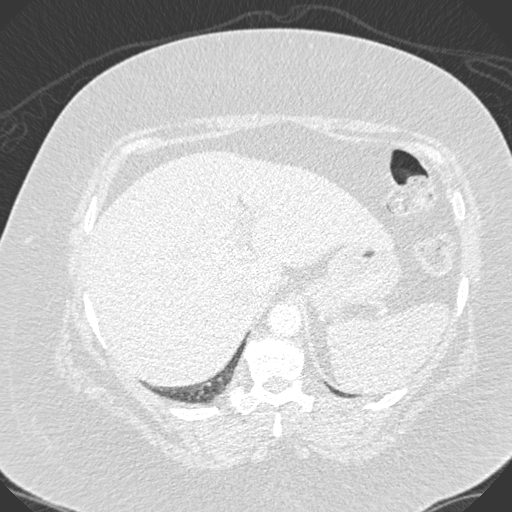
[im 51/254  mediastinal]
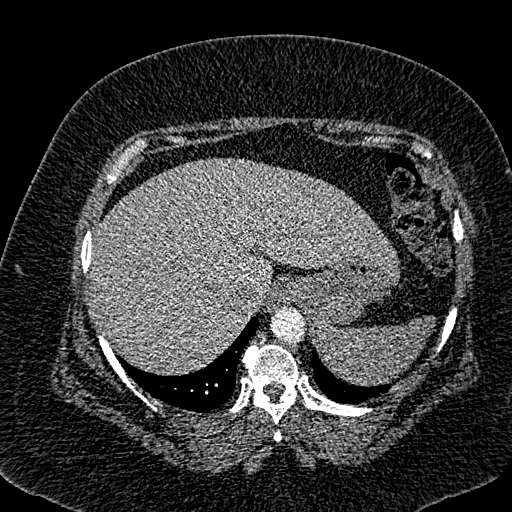
[im 64/254  lung]
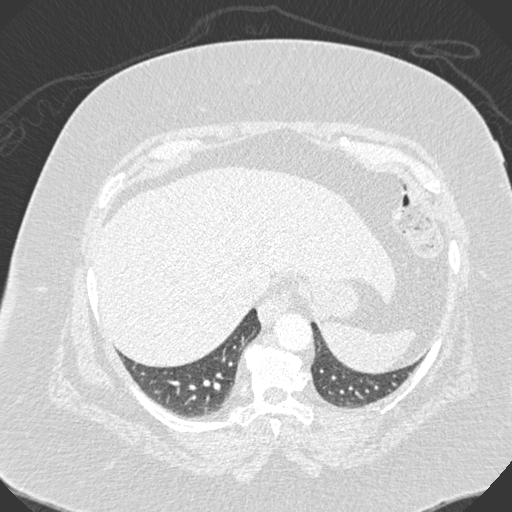
[im 76/254  mediastinal]
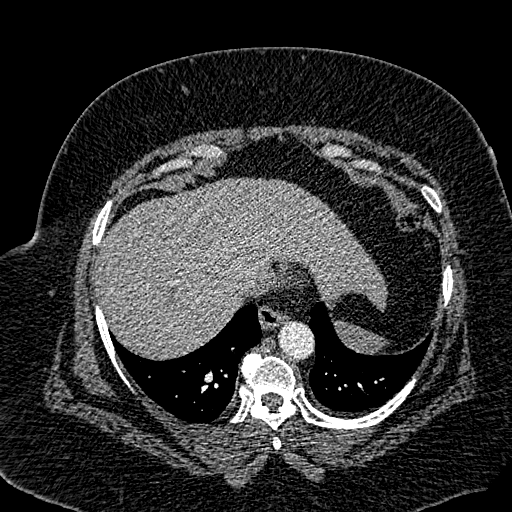
[im 89/254  lung]
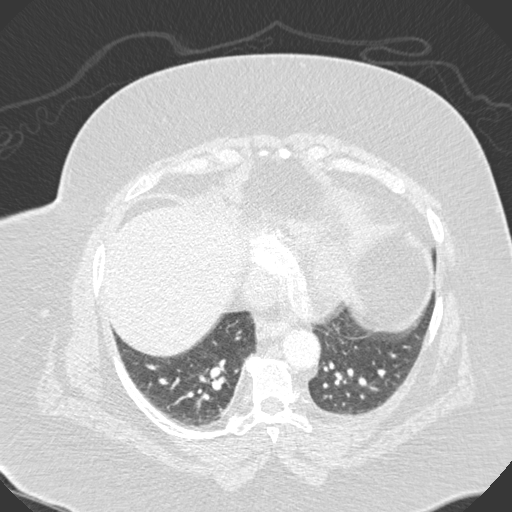
[im 102/254  mediastinal]
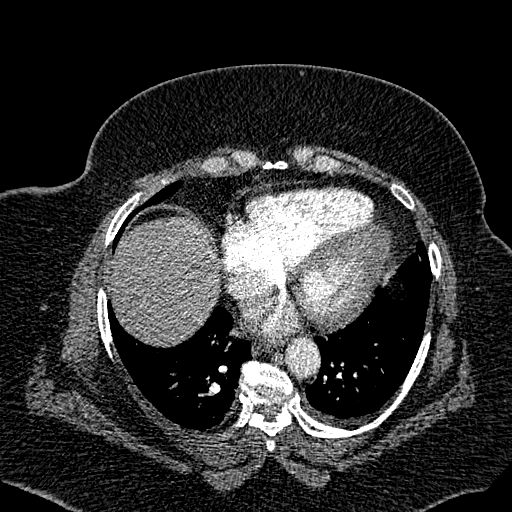
[im 114/254  lung]
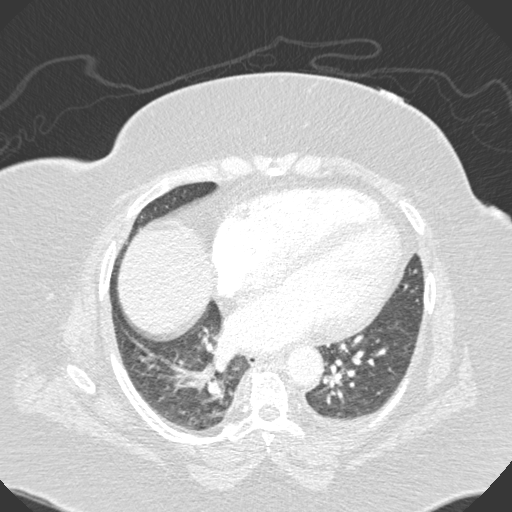
[im 140/254  mediastinal]
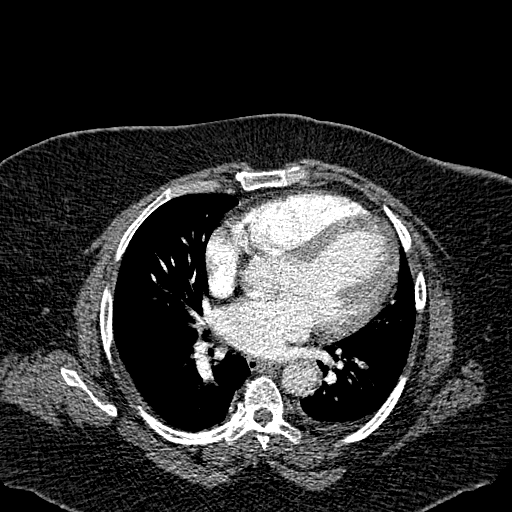
[im 152/254  lung]
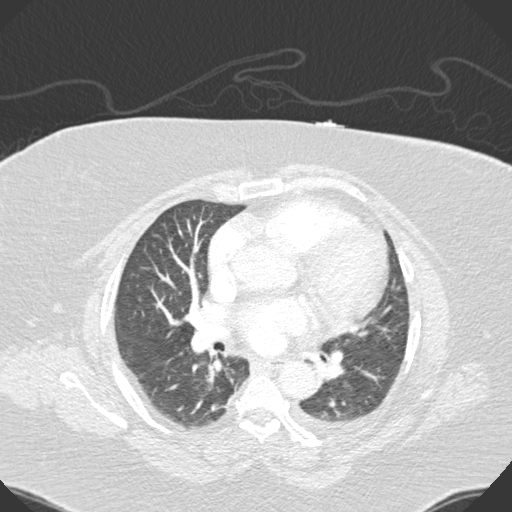
[im 165/254  mediastinal]
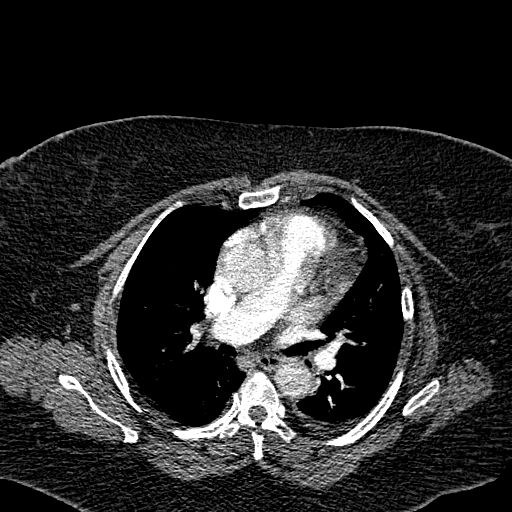
[im 178/254  lung]
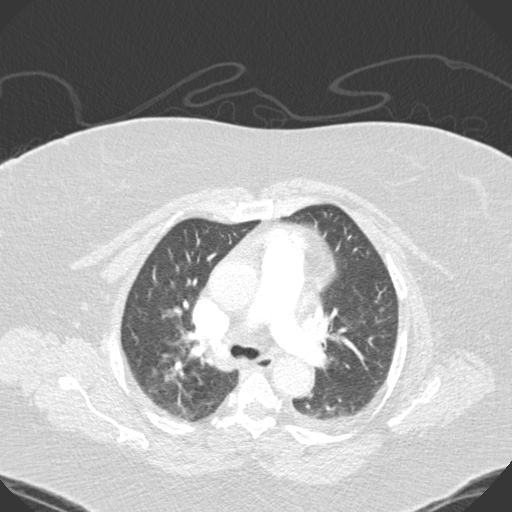
[im 190/254  mediastinal]
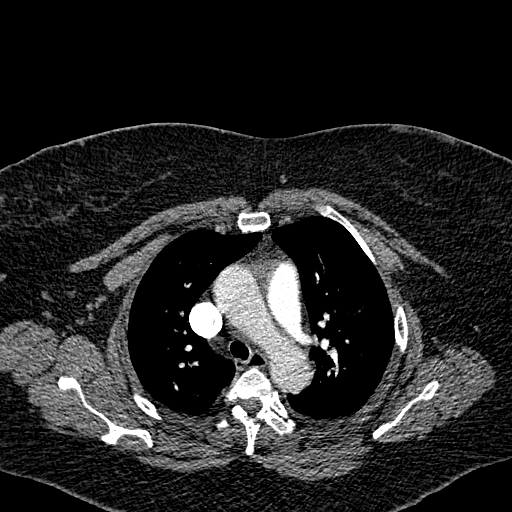
[im 203/254  lung]
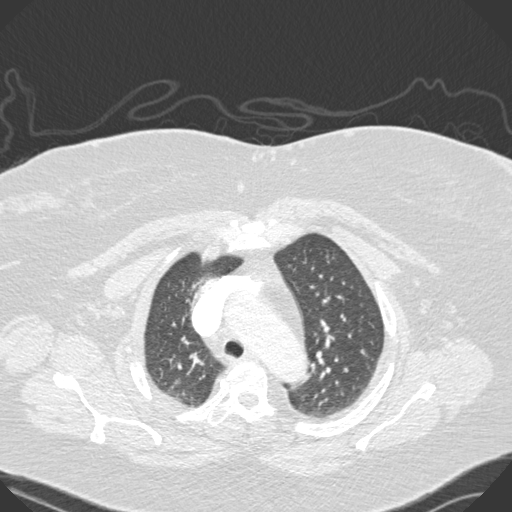
[im 216/254  mediastinal]
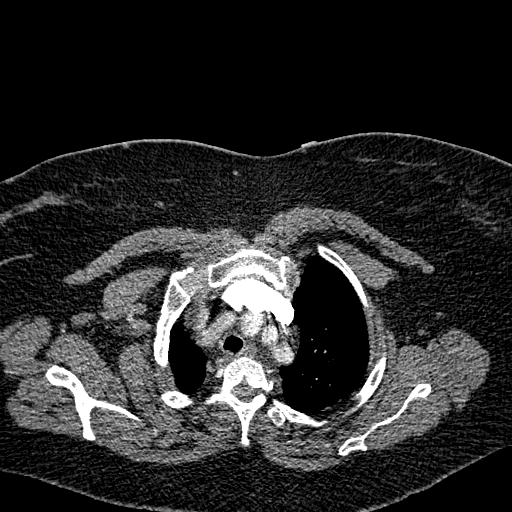
[im 228/254  lung]
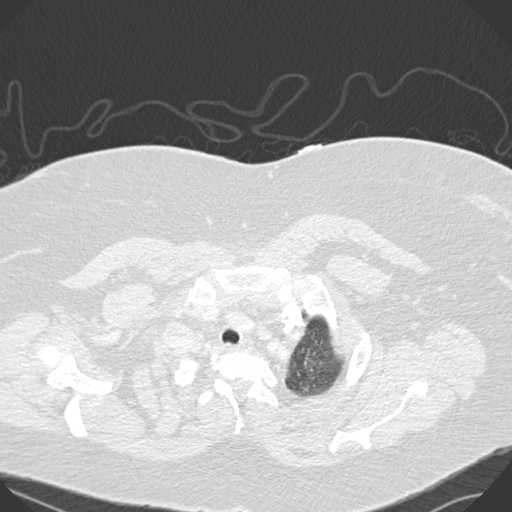
[im 241/254  mediastinal]
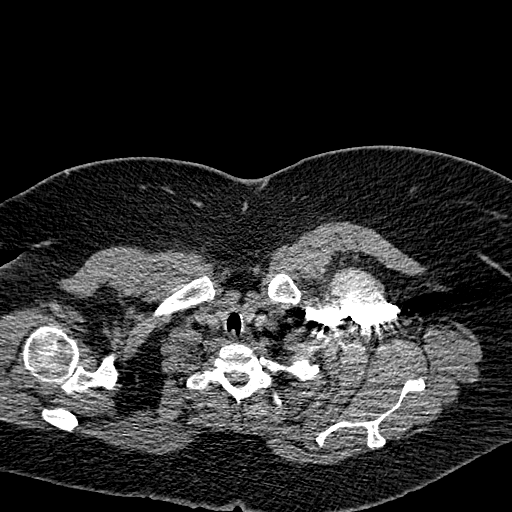

[Series 602: coronal · coronal · 0.75mm/px · 1 of 73 slices shown]
[im 37/73  mediastinal]
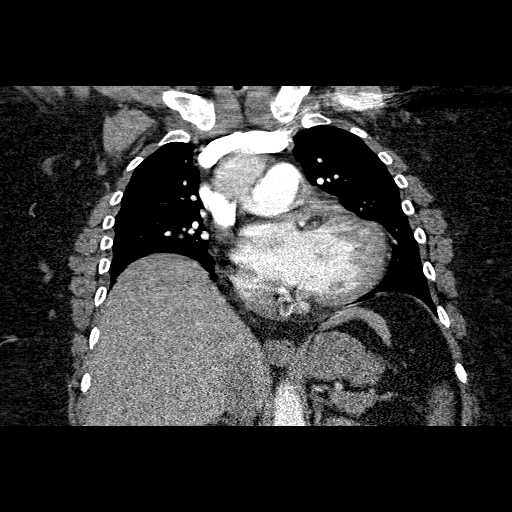

[19 of 36 positions shown; findings below may reference images not displayed]

FINDINGS: No filling defects in the pulmonary arteries to suggest
pulmonary emboli.  There is cardiomegaly.  Aorta is normal caliber.
No dissection.  There is a small left pleural effusion mild
vascular congestion noted.  No focal airspace opacities or nodules.

No mediastinal, hilar, or axillary adenopathy.  Visualized thyroid
and chest wall soft tissues unremarkable. Imaging into the upper
abdomen shows no acute findings.  Degenerative changes in the
thoracic spine.  No acute bony abnormality.
IMPRESSION: No evidence of pulmonary embolus, aortic aneurysm or dissection.

Cardiomegaly, vascular congestion.

## 2009-08-14 IMAGING — CT CT HEAD W/O CM
1 series · 16 of 30 positions shown, 20 images · non-contrast
Comparison: 10/08/2006

CLINICAL DATA: Dizziness, syncope, possible fall.

CT HEAD WITHOUT CONTRAST
TECHNIQUE: Contiguous axial images were obtained from the base of
the skull through the vertex without contrast.

[Series 2: head routine 4.8 h37s · axial · 0.43mm/px · z∈[+968,+1128]mm · 16 of 36 slices shown, 20 images]
[im 2/36  brain]
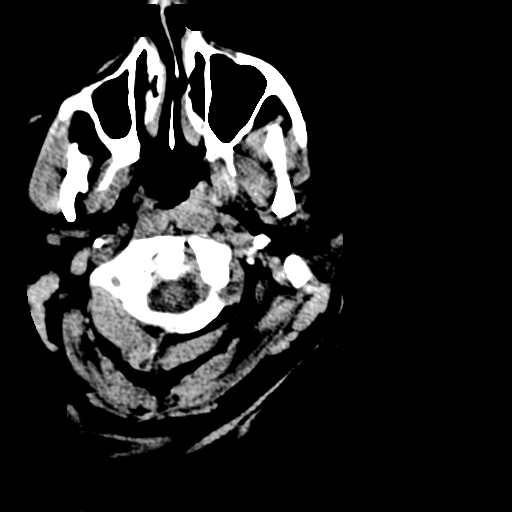
[im 2/36  bone]
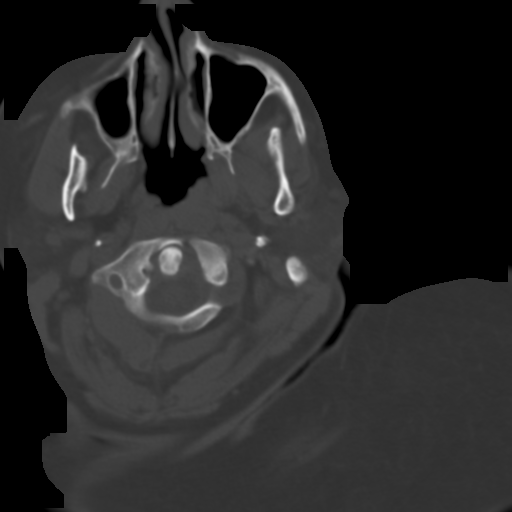
[im 4/36  brain]
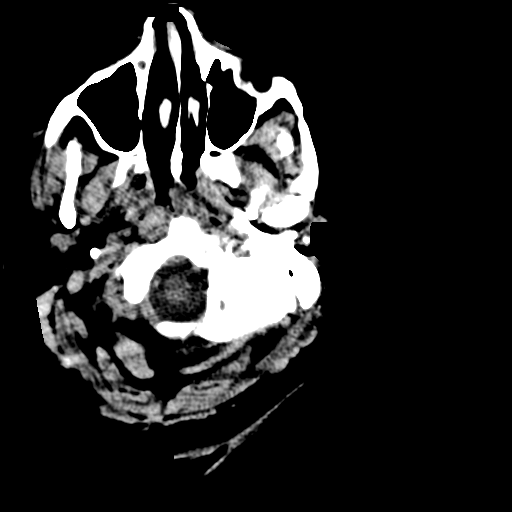
[im 7/36  brain]
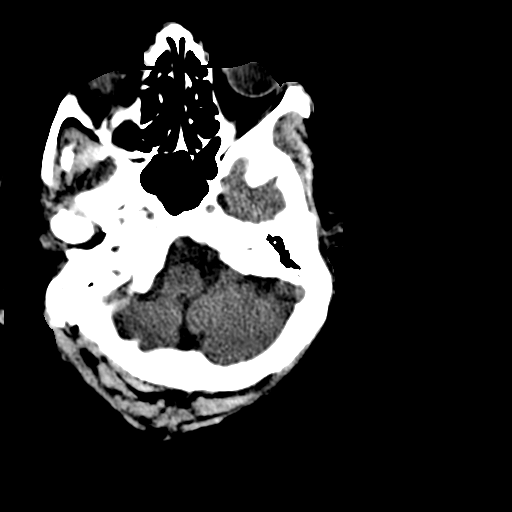
[im 9/36  brain]
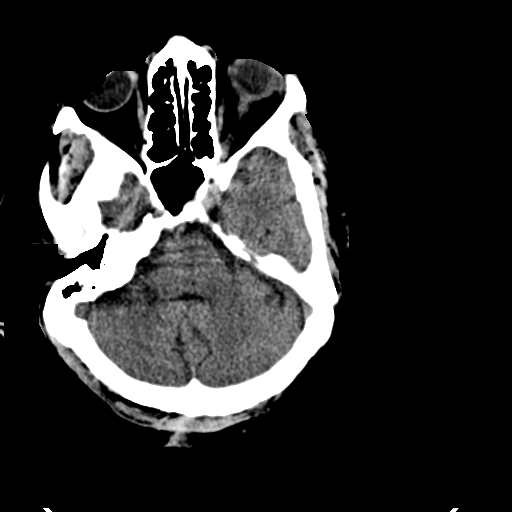
[im 10/36  brain]
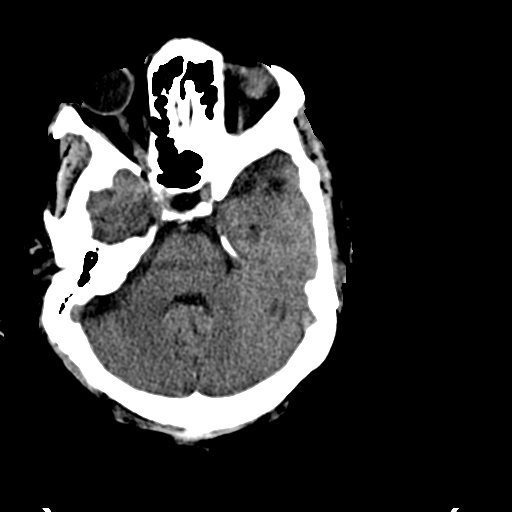
[im 10/36  bone]
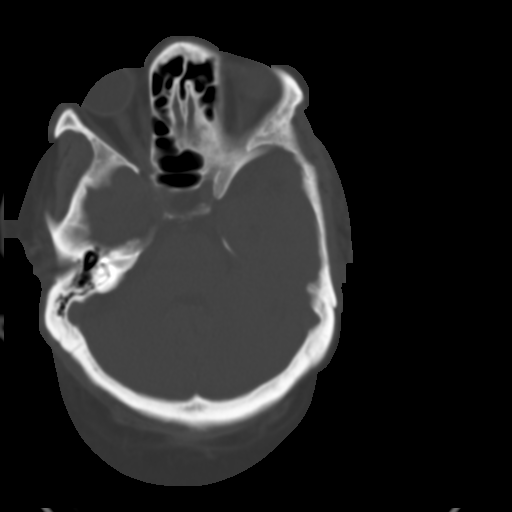
[im 13/36  brain]
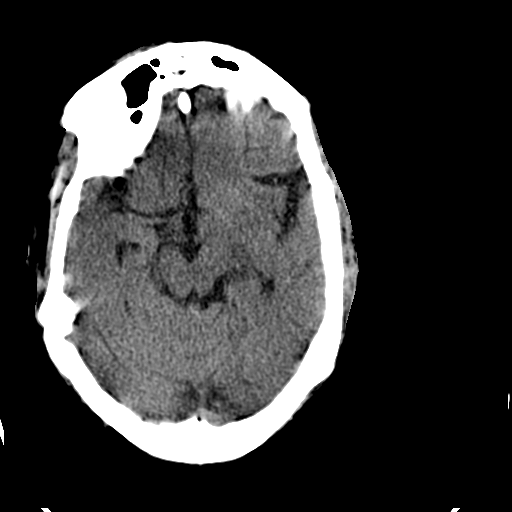
[im 15/36  brain]
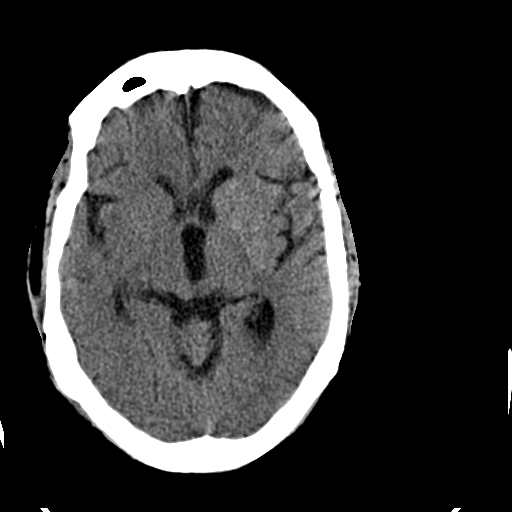
[im 17/36  brain]
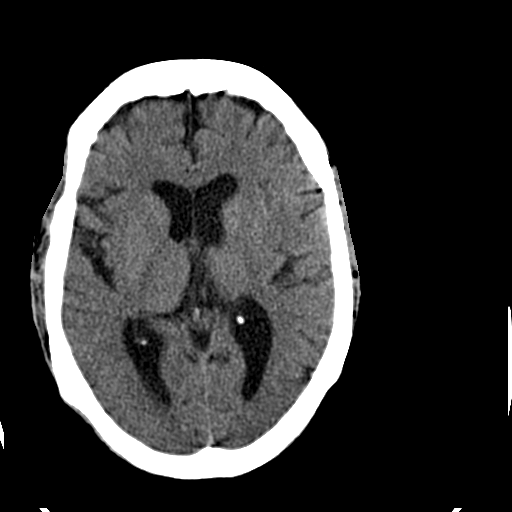
[im 19/36  brain]
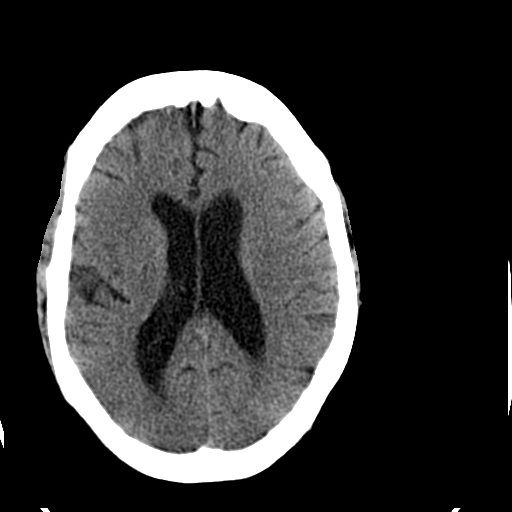
[im 19/36  bone]
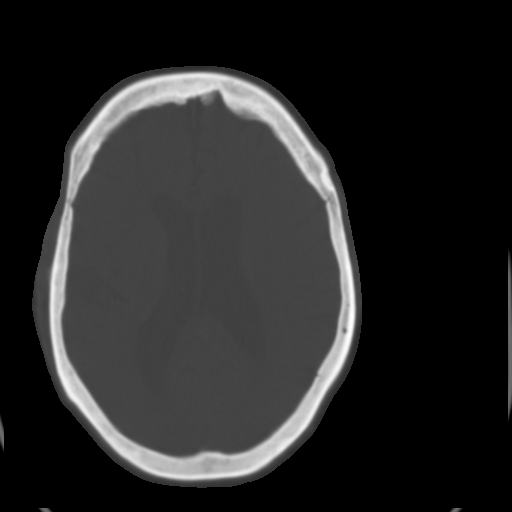
[im 21/36  brain]
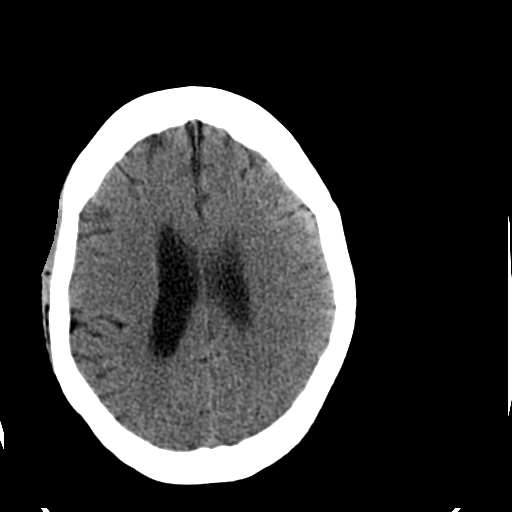
[im 23/36  brain]
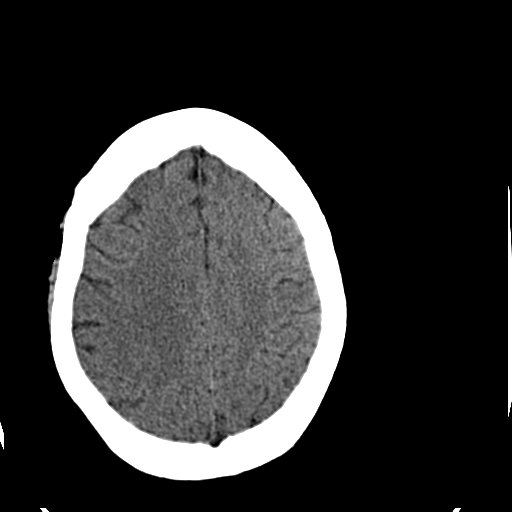
[im 26/36  brain]
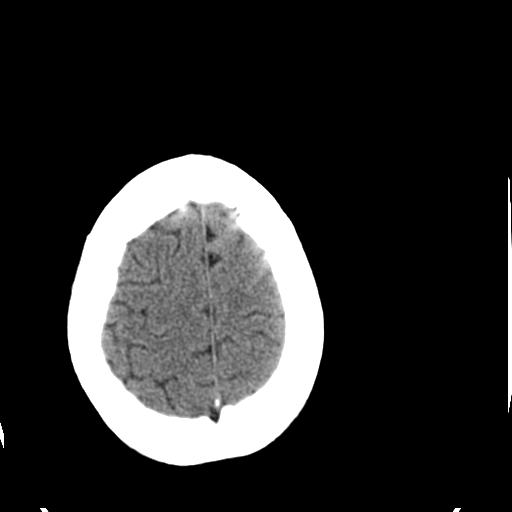
[im 27/36  brain]
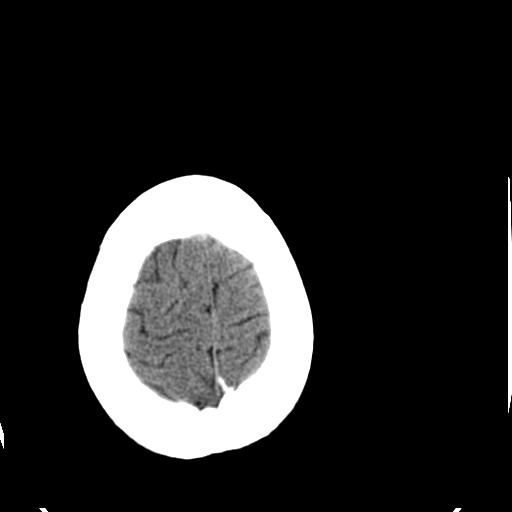
[im 27/36  bone]
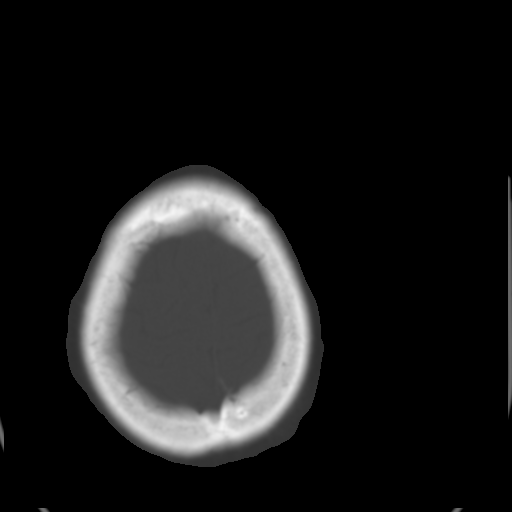
[im 29/36  brain]
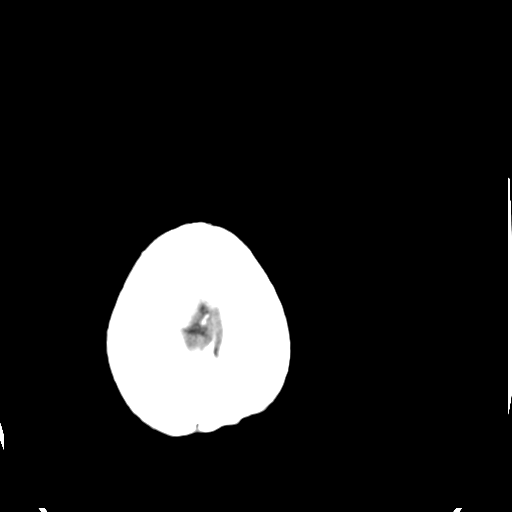
[im 32/36  brain]
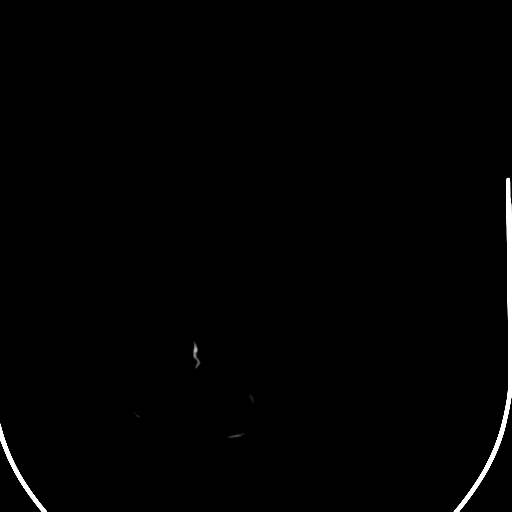
[im 34/36  brain]
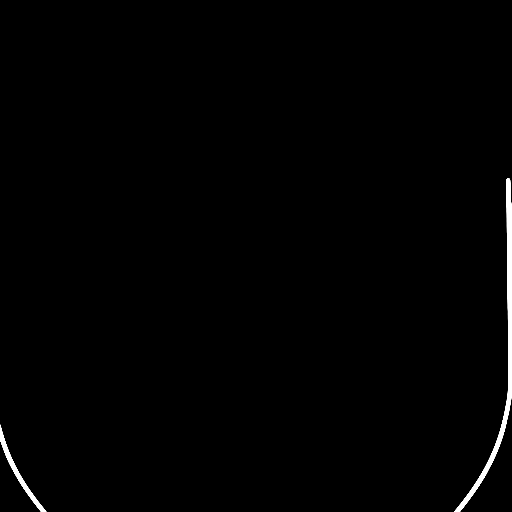

[16 of 30 positions shown; findings below may reference images not displayed]

FINDINGS: No acute intracranial abnormality.  Specifically, no
hemorrhage, hydrocephalus, mass lesion, acute infarction, or
significant intracranial injury.  No acute calvarial abnormality.

Osteoma noted in the frontal sinuses near the midline, stable since
prior study, with slight mucoperiosteal thickening seen in the
right frontal sinus.
IMPRESSION: No acute intracranial abnormality.

## 2009-08-27 IMAGING — CR DG CHEST 2V
1 series · 1 of 1 positions shown · non-contrast
Comparison: 04/16/2008

CLINICAL DATA: Chest pain.  Shortness of breath.  Weakness.

CHEST - 2 VIEW

[view not recorded]
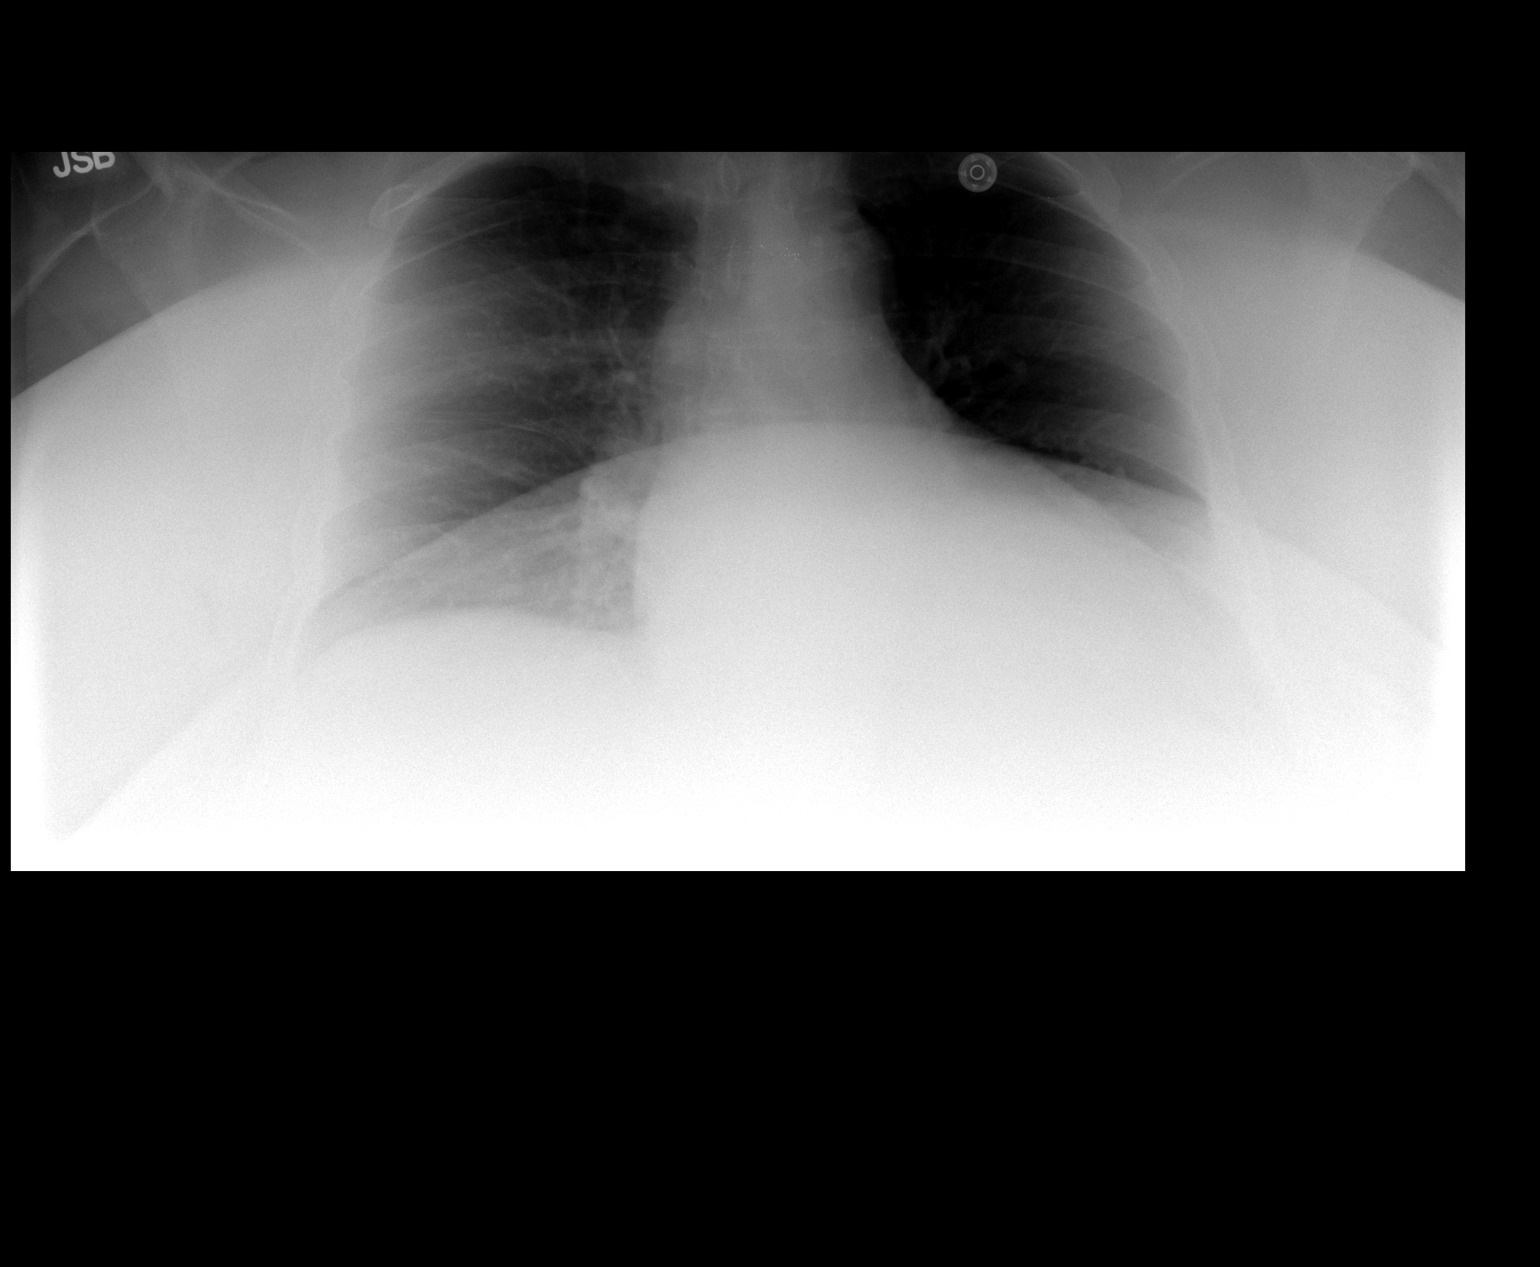

[1 of 1 positions shown; findings below may reference images not displayed]

FINDINGS: Low lung volumes are seen however both lungs are clear.
There is no evidence of pleural effusion.  Heart size and
mediastinal contours are within normal limits.
IMPRESSION: Low lung volumes.  No acute findings.

## 2009-08-28 ENCOUNTER — Encounter: Payer: Self-pay | Admitting: Physician Assistant

## 2009-08-28 ENCOUNTER — Emergency Department (HOSPITAL_COMMUNITY): Admission: EM | Admit: 2009-08-28 | Discharge: 2009-08-28 | Payer: Self-pay | Admitting: Emergency Medicine

## 2009-09-02 ENCOUNTER — Encounter: Payer: Self-pay | Admitting: Physician Assistant

## 2009-09-03 IMAGING — CR DG CHEST 2V
2 series · 2 of 2 positions shown · non-contrast
Comparison: 04/29/2008

CLINICAL DATA: Chest pain/hemoptysis

CHEST - 2 VIEW

[w chest pa]
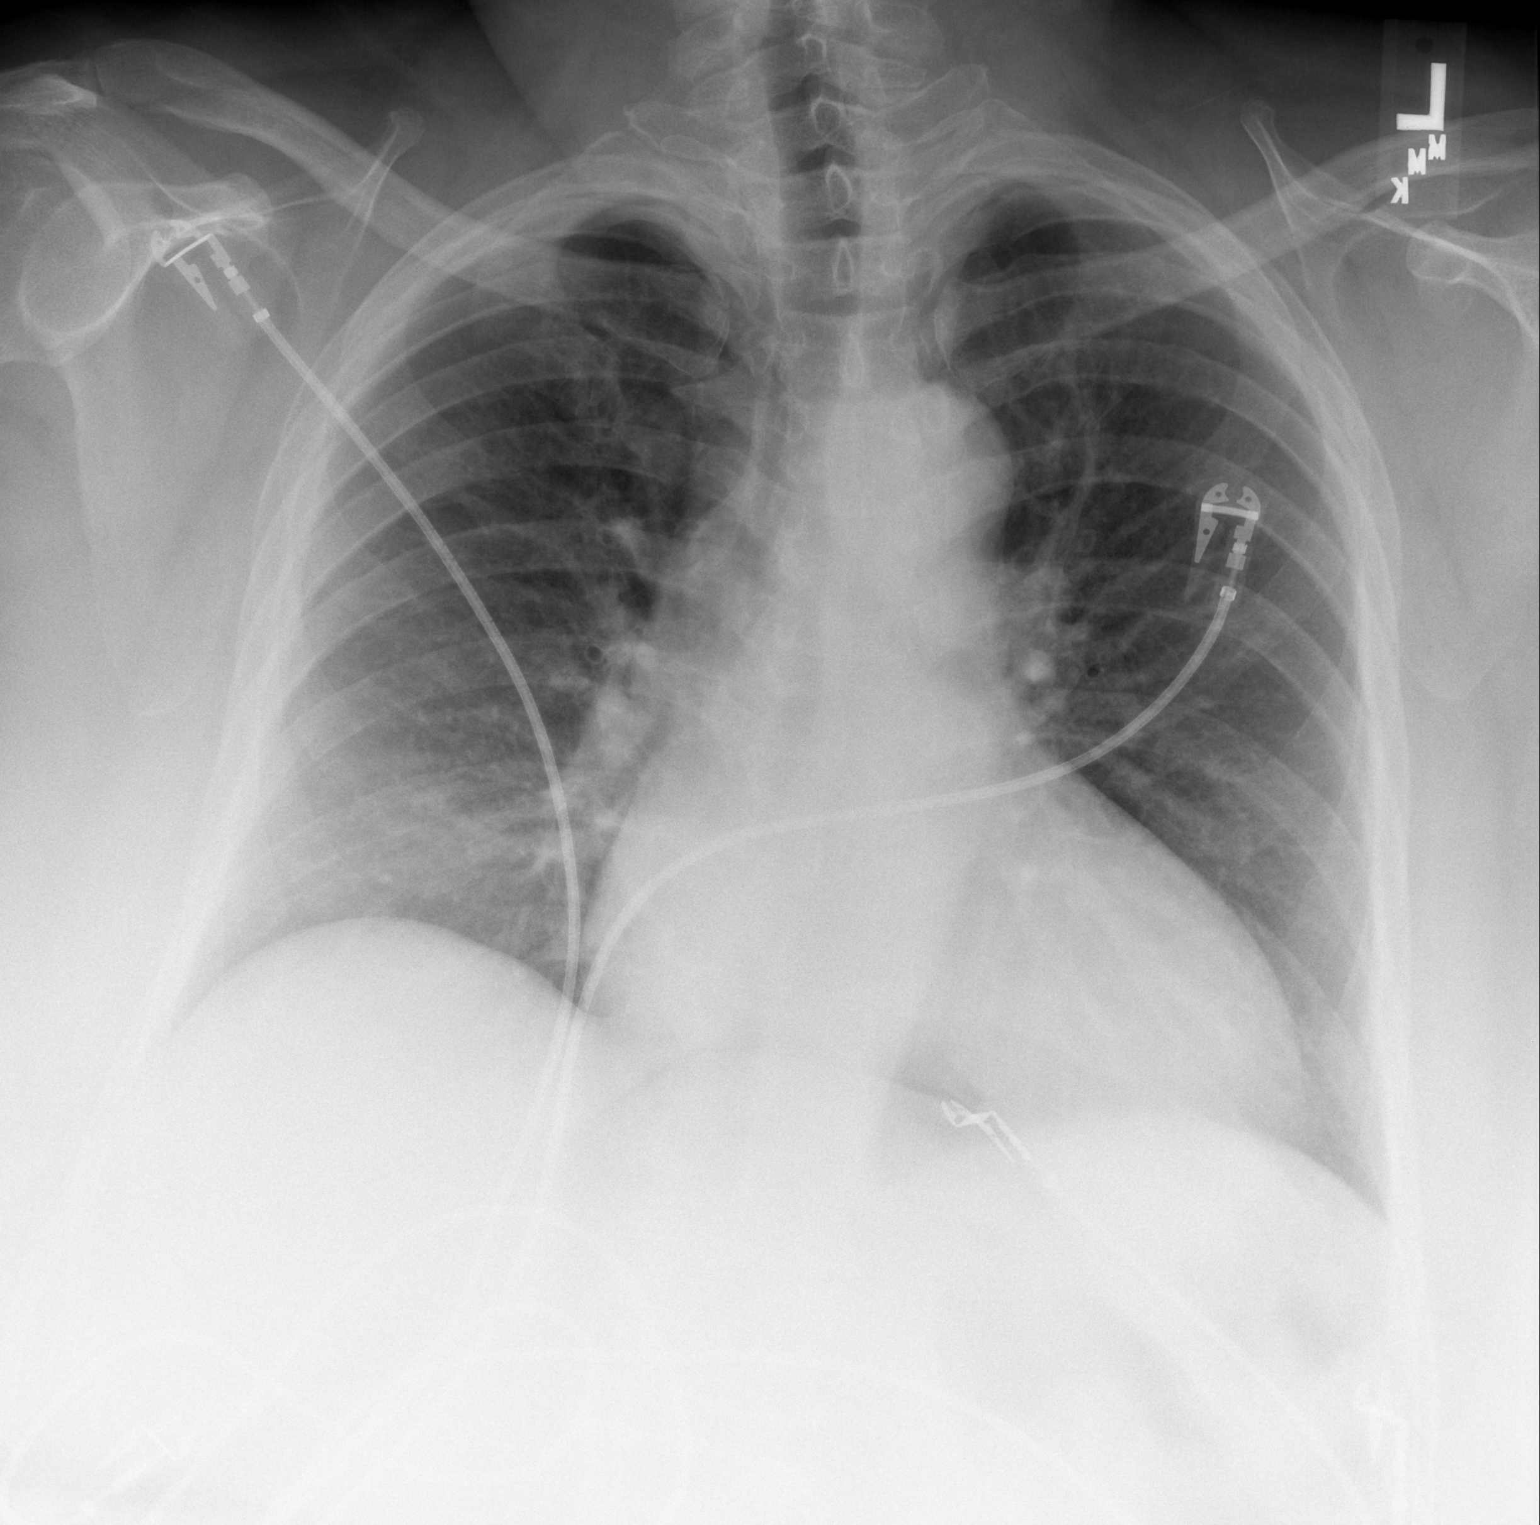

[w chest lat]
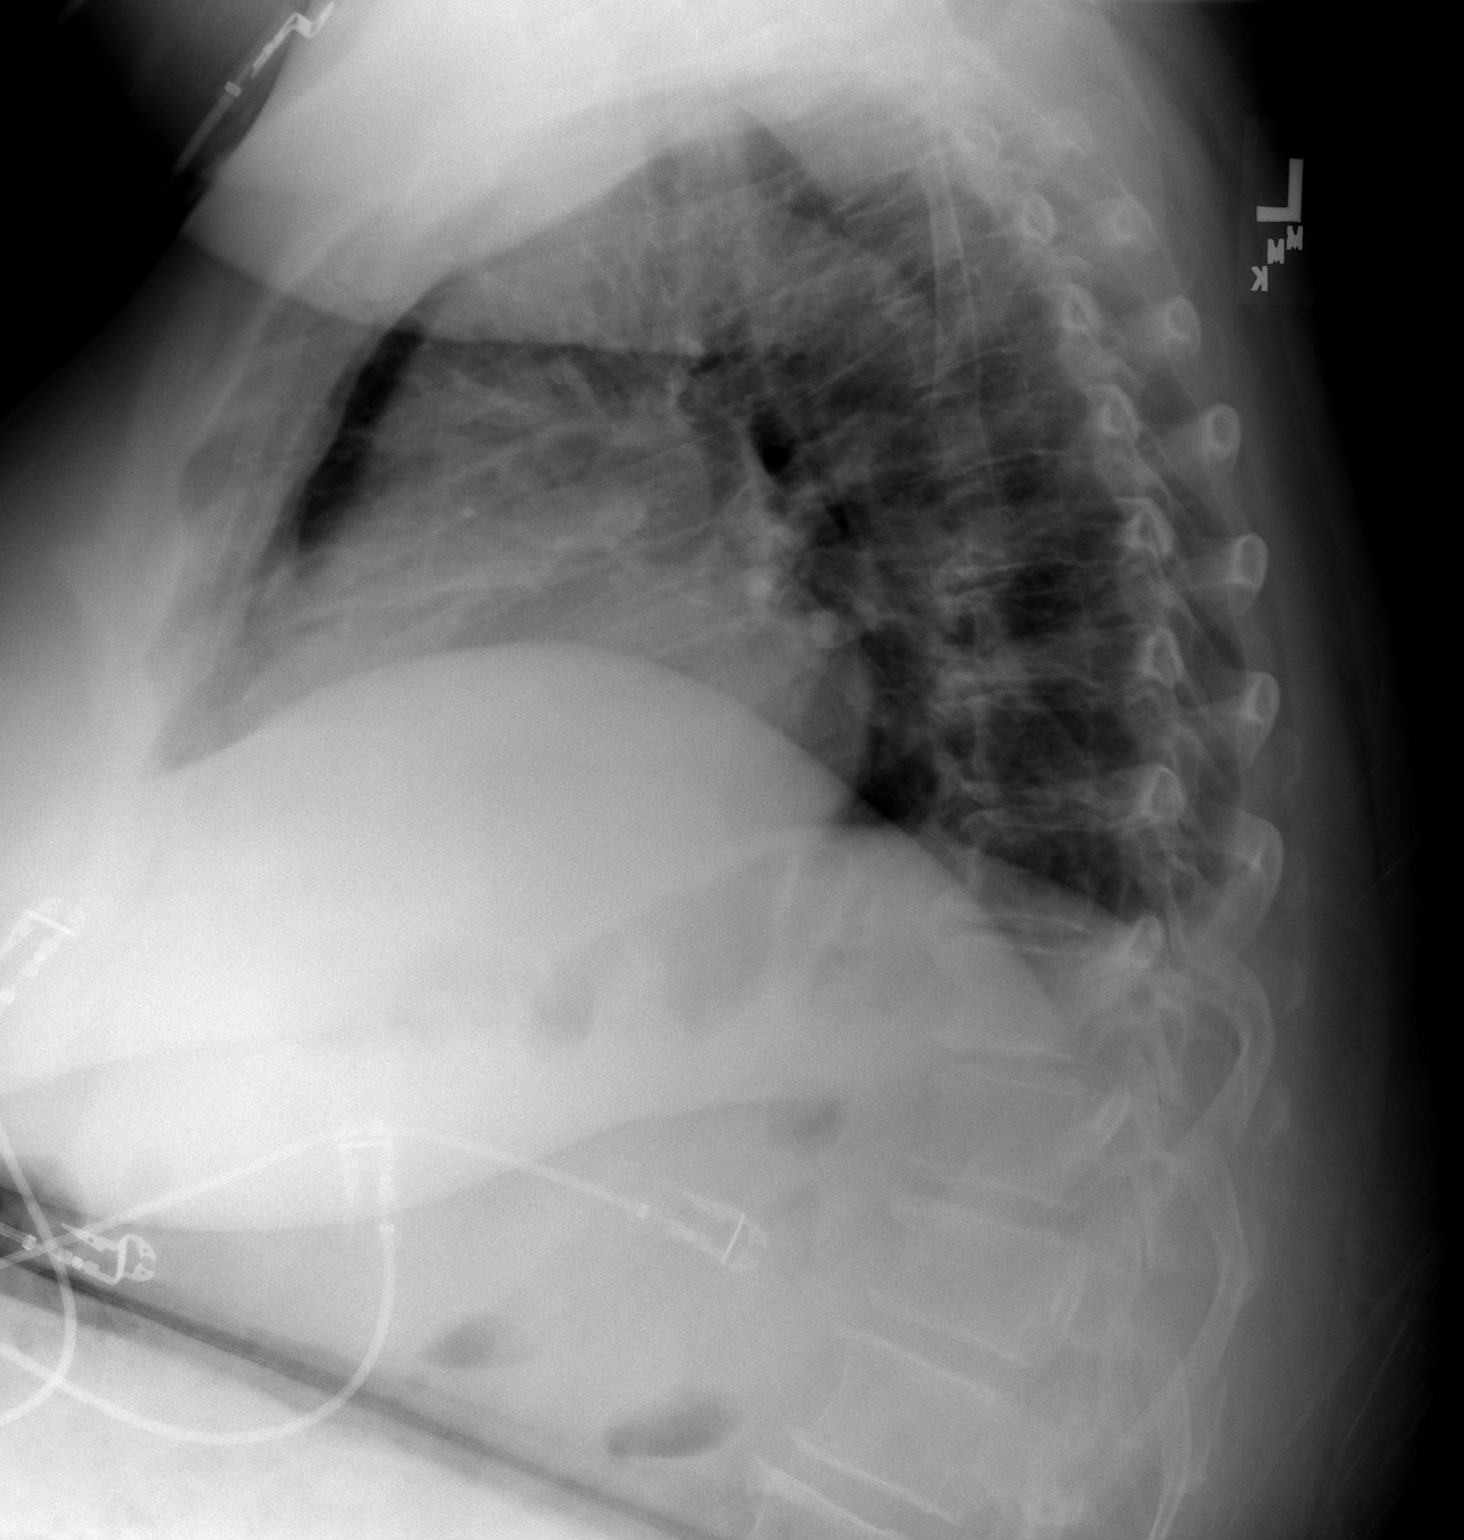

[2 of 2 positions shown; findings below may reference images not displayed]

FINDINGS: The heart is mildly enlarged.  There is no congestive
heart failure or pneumonia.  No pleural fluid or osseous lesions.
IMPRESSION: Cardiomegaly - no active disease.

## 2009-09-17 ENCOUNTER — Telehealth: Payer: Self-pay | Admitting: Physician Assistant

## 2009-09-28 ENCOUNTER — Telehealth: Payer: Self-pay | Admitting: Physician Assistant

## 2009-09-28 ENCOUNTER — Ambulatory Visit: Payer: Self-pay | Admitting: Physician Assistant

## 2009-09-28 LAB — CONVERTED CEMR LAB
ALT: 14 units/L (ref 0–35)
Albumin: 4.4 g/dL (ref 3.5–5.2)
Alkaline Phosphatase: 49 units/L (ref 39–117)
Blood in Urine, dipstick: NEGATIVE
Glucose, Bld: 89 mg/dL (ref 70–99)
Glucose, Urine, Semiquant: NEGATIVE
Ketones, urine, test strip: NEGATIVE
Potassium: 4.7 meq/L (ref 3.5–5.3)
Sodium: 135 meq/L (ref 135–145)
Total Protein: 7.2 g/dL (ref 6.0–8.3)
WBC Urine, dipstick: NEGATIVE
pH: 8

## 2009-09-29 ENCOUNTER — Telehealth: Payer: Self-pay | Admitting: Physician Assistant

## 2009-09-30 ENCOUNTER — Encounter: Payer: Self-pay | Admitting: Physician Assistant

## 2009-10-06 ENCOUNTER — Emergency Department (HOSPITAL_COMMUNITY): Admission: EM | Admit: 2009-10-06 | Discharge: 2009-10-07 | Payer: Self-pay | Admitting: Emergency Medicine

## 2009-10-13 ENCOUNTER — Telehealth (INDEPENDENT_AMBULATORY_CARE_PROVIDER_SITE_OTHER): Payer: Self-pay | Admitting: *Deleted

## 2009-10-15 IMAGING — CR DG CHEST 2V
2 series · 2 of 2 positions shown · non-contrast
Comparison: 05/06/2008

CLINICAL DATA: Chest pain.  Abdominal pain.  Vomiting.

CHEST - 2 VIEW

[w chest pa]
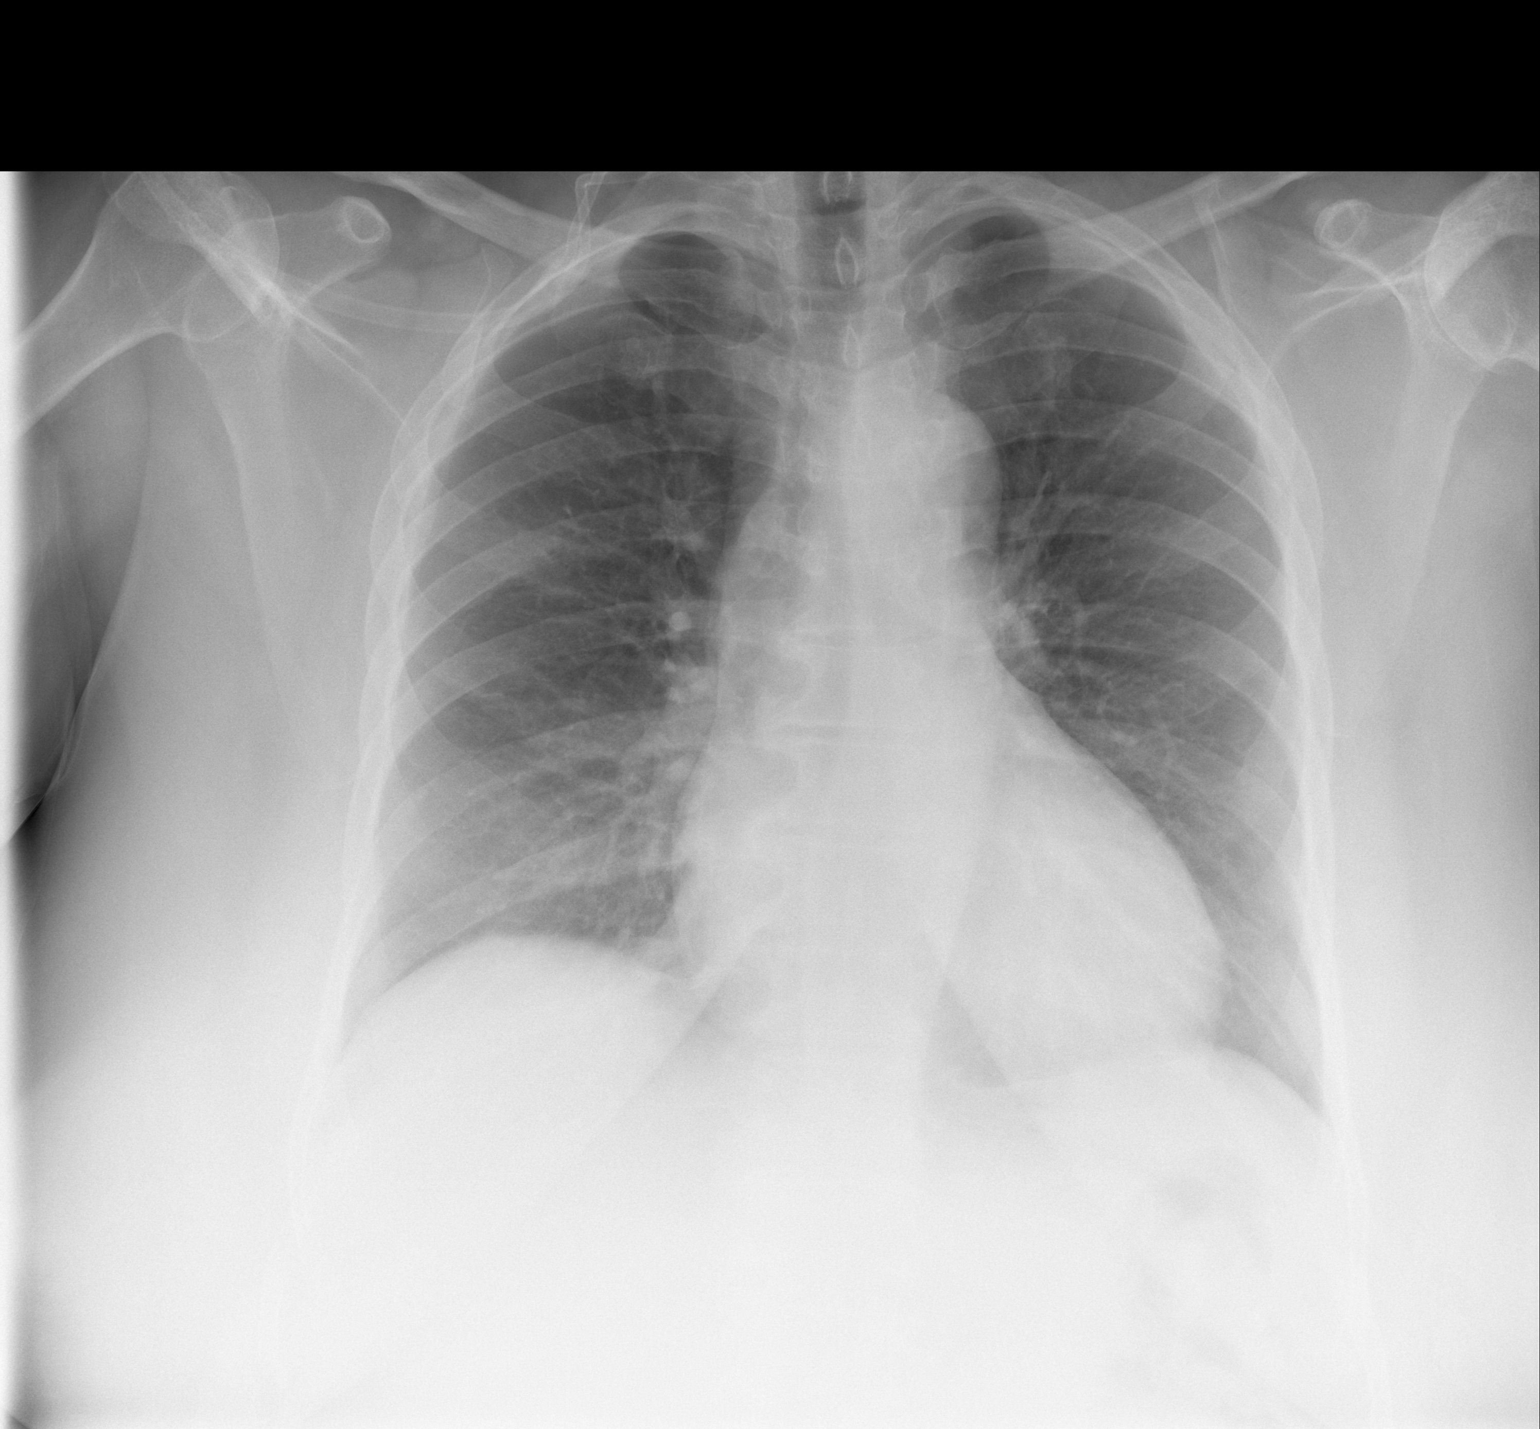

[w chest lat]
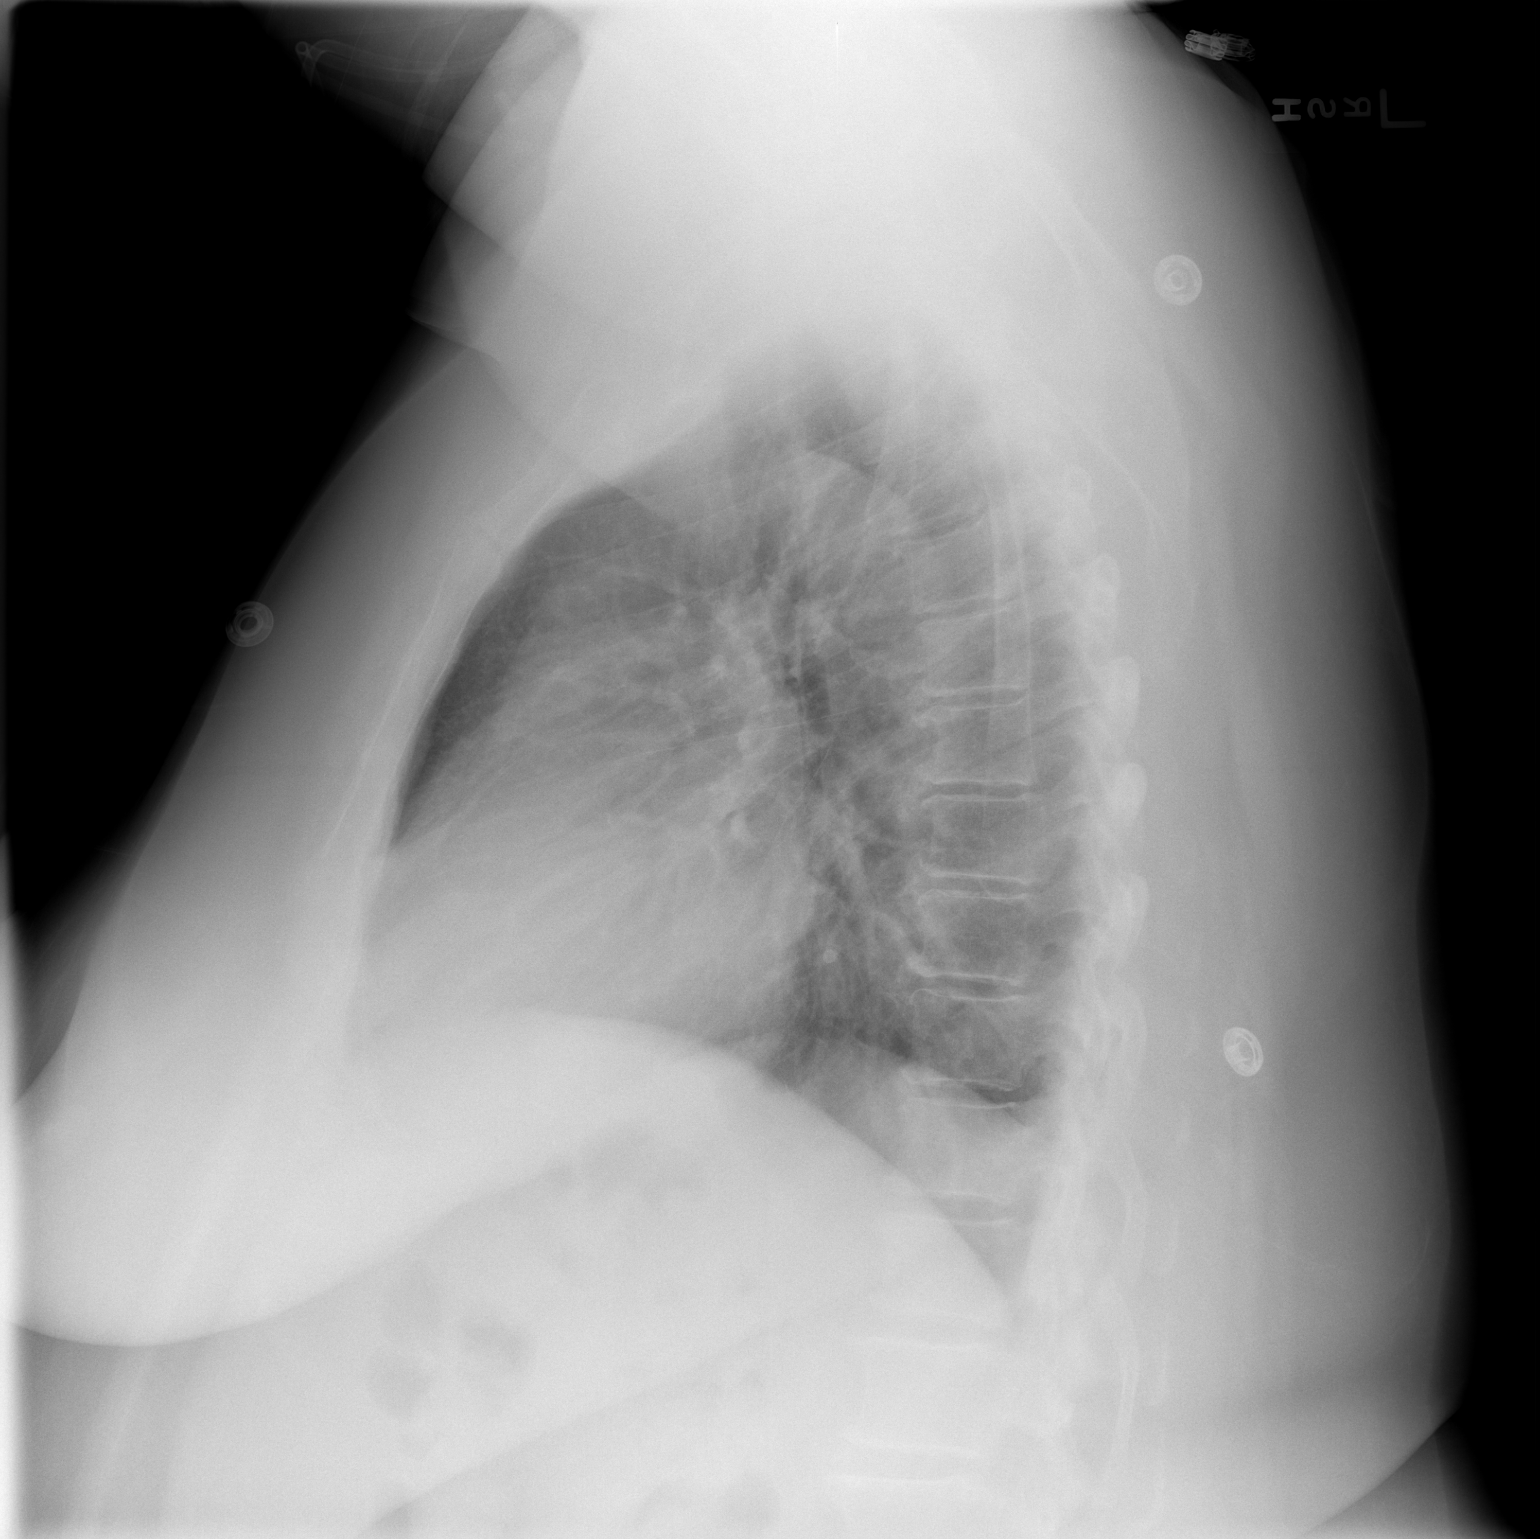

[2 of 2 positions shown; findings below may reference images not displayed]

FINDINGS: Heart size upper limits normal for projection.
Mediastinal contours within normal limits.  The trachea is midline.
Mild right base atelectasis.  No airspace disease or effusion.
IMPRESSION: No acute cardiopulmonary disease.

## 2009-10-19 ENCOUNTER — Encounter: Payer: Self-pay | Admitting: Physician Assistant

## 2009-10-21 ENCOUNTER — Telehealth: Payer: Self-pay | Admitting: Physician Assistant

## 2009-10-21 ENCOUNTER — Encounter: Payer: Self-pay | Admitting: Physician Assistant

## 2009-10-26 ENCOUNTER — Telehealth: Payer: Self-pay | Admitting: Physician Assistant

## 2009-10-27 ENCOUNTER — Encounter: Payer: Self-pay | Admitting: Physician Assistant

## 2009-10-27 ENCOUNTER — Observation Stay (HOSPITAL_COMMUNITY): Admission: EM | Admit: 2009-10-27 | Discharge: 2009-10-28 | Payer: Self-pay | Admitting: Emergency Medicine

## 2009-10-27 ENCOUNTER — Encounter (INDEPENDENT_AMBULATORY_CARE_PROVIDER_SITE_OTHER): Payer: Self-pay | Admitting: Internal Medicine

## 2009-11-04 ENCOUNTER — Telehealth: Payer: Self-pay | Admitting: Physician Assistant

## 2009-11-05 ENCOUNTER — Encounter: Payer: Self-pay | Admitting: Physician Assistant

## 2009-11-06 ENCOUNTER — Encounter: Payer: Self-pay | Admitting: Physician Assistant

## 2009-11-09 ENCOUNTER — Telehealth: Payer: Self-pay | Admitting: Physician Assistant

## 2009-11-17 ENCOUNTER — Ambulatory Visit: Payer: Self-pay | Admitting: Pulmonary Disease

## 2009-11-17 DIAGNOSIS — J45909 Unspecified asthma, uncomplicated: Secondary | ICD-10-CM | POA: Insufficient documentation

## 2009-11-17 DIAGNOSIS — G4700 Insomnia, unspecified: Secondary | ICD-10-CM | POA: Insufficient documentation

## 2009-11-17 DIAGNOSIS — I219 Acute myocardial infarction, unspecified: Secondary | ICD-10-CM | POA: Insufficient documentation

## 2009-11-30 ENCOUNTER — Ambulatory Visit: Payer: Self-pay | Admitting: Physician Assistant

## 2009-11-30 DIAGNOSIS — K8020 Calculus of gallbladder without cholecystitis without obstruction: Secondary | ICD-10-CM

## 2009-11-30 HISTORY — DX: Calculus of gallbladder without cholecystitis without obstruction: K80.20

## 2009-12-03 IMAGING — CR DG CHEST 2V
2 series · 2 of 2 positions shown · non-contrast
Comparison: 06/17/2008

CLINICAL DATA: Chest pain.

CHEST - 2 VIEW

[w chest pa]
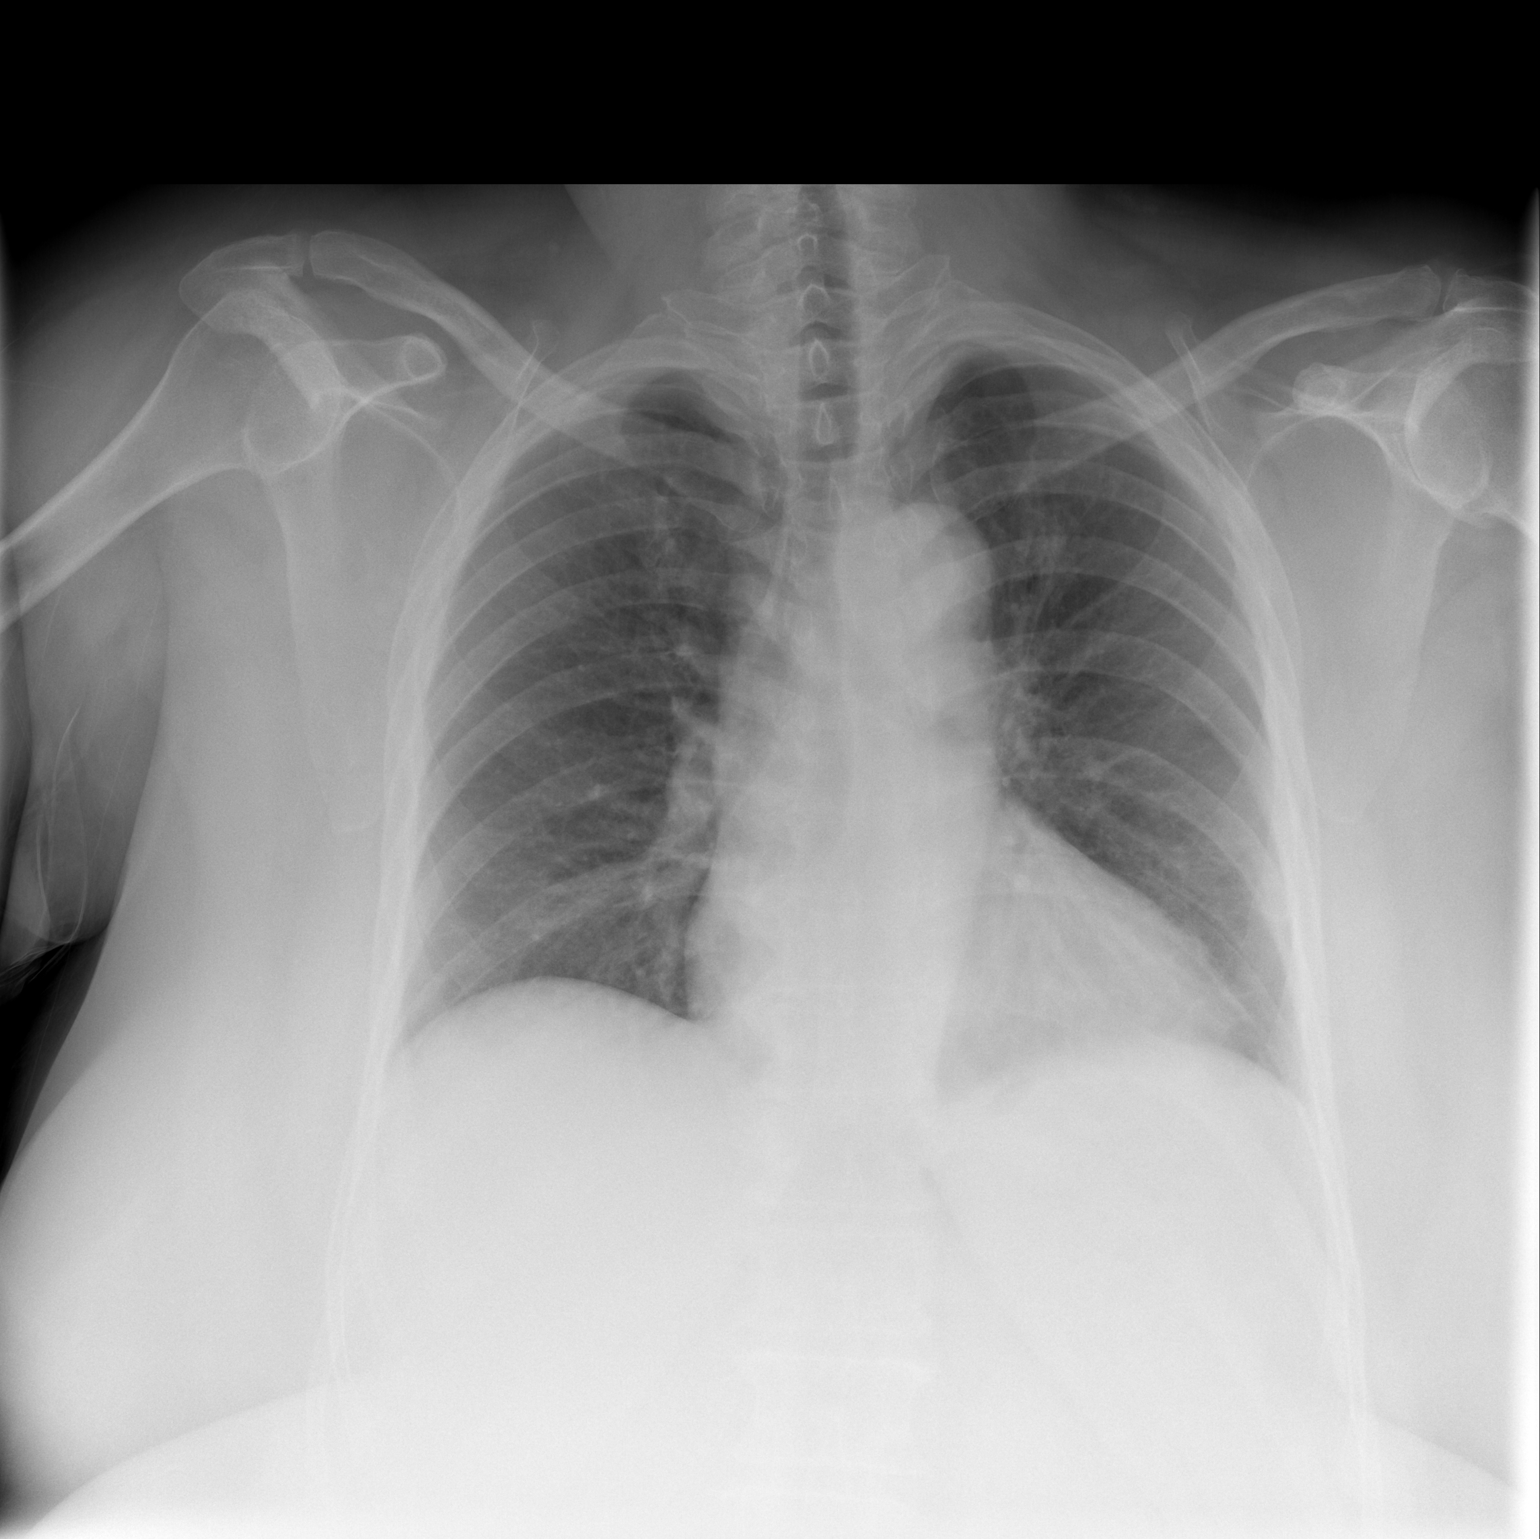

[w chest lat]
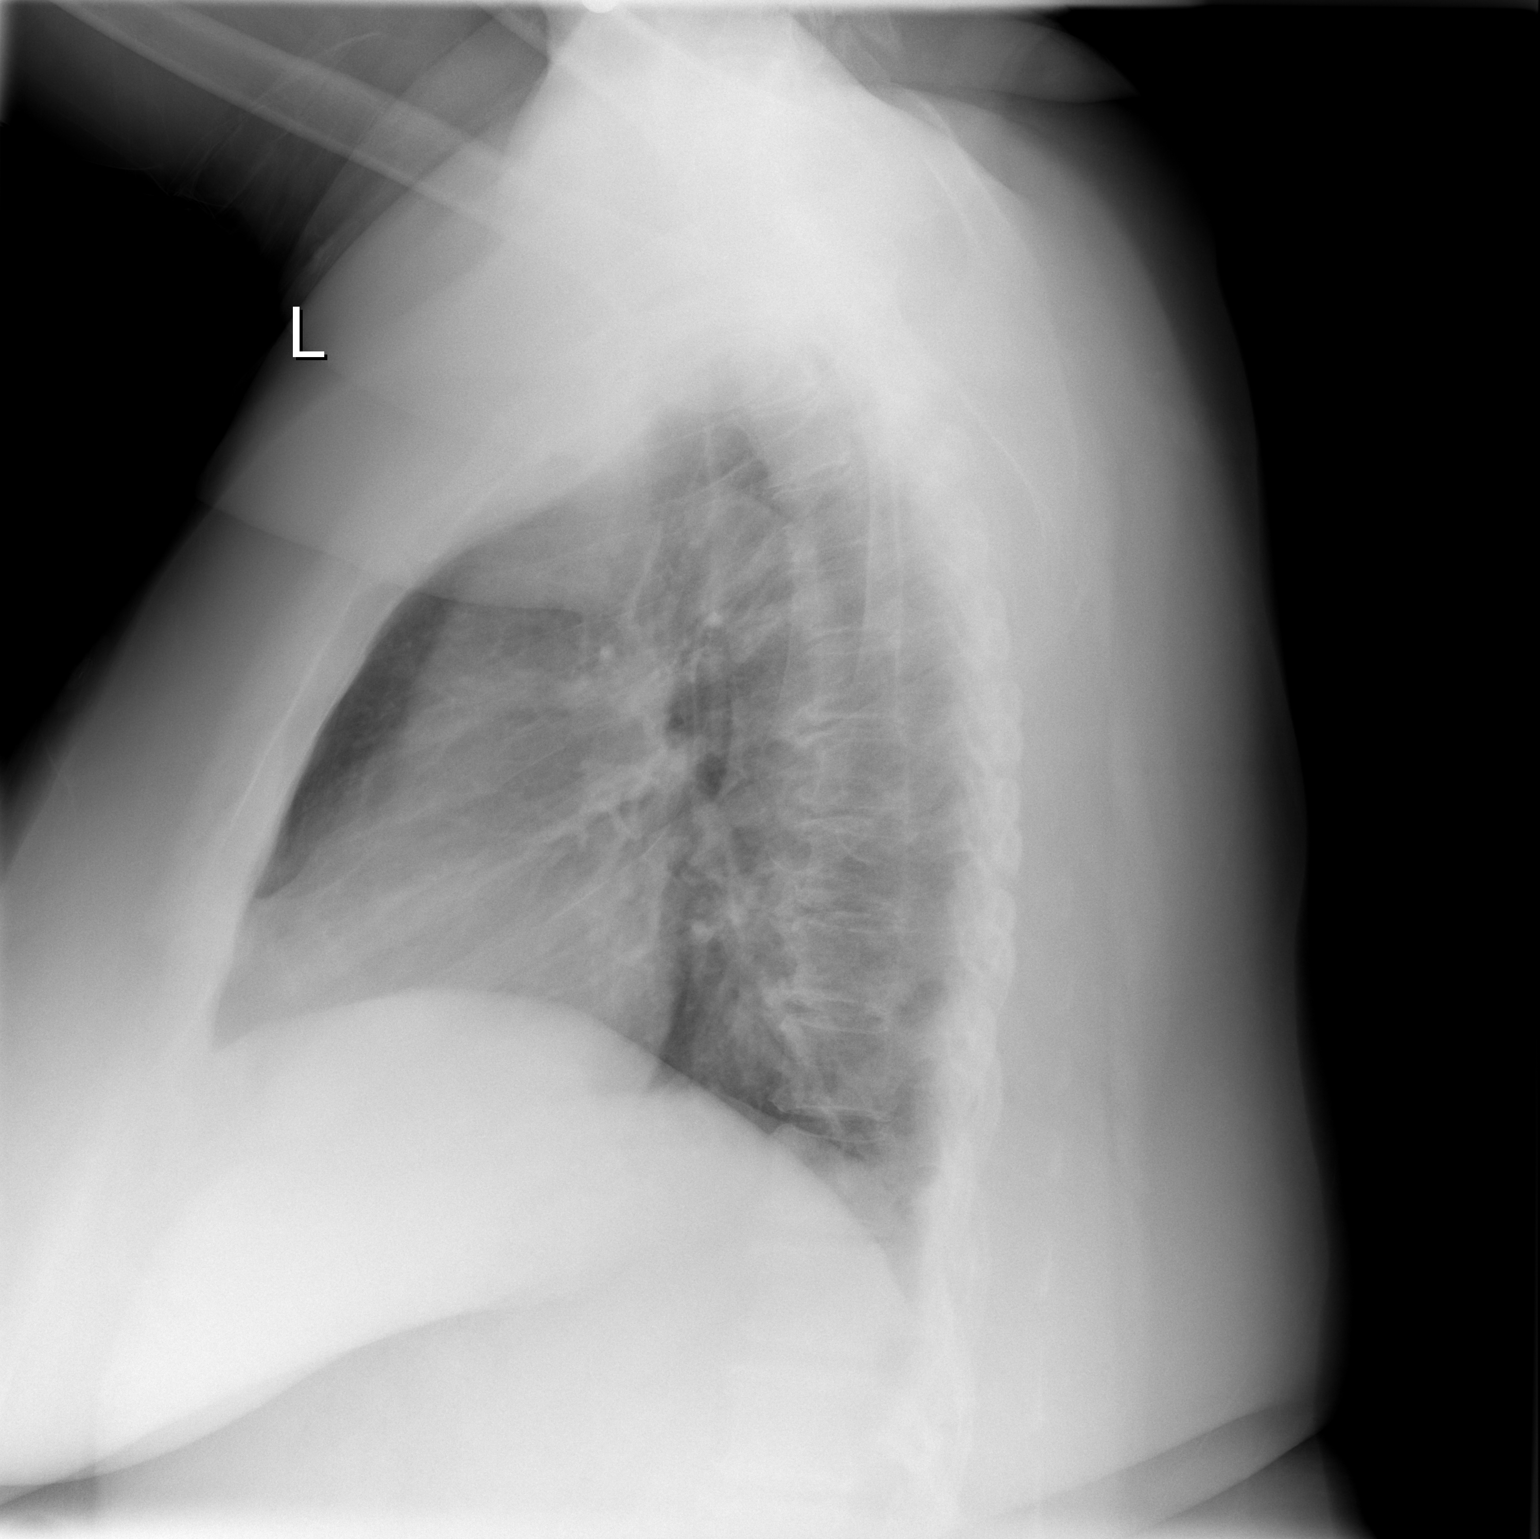

[2 of 2 positions shown; findings below may reference images not displayed]

FINDINGS: Cardiopericardial silhouette and mediastinal contours
appear within normal limits.  Mild atelectasis is present at the
left lung base.  No airspace disease or effusion.  No interval
change from prior exams.
IMPRESSION: No active cardiopulmonary disease.

## 2009-12-15 ENCOUNTER — Telehealth: Payer: Self-pay | Admitting: Physician Assistant

## 2009-12-26 ENCOUNTER — Inpatient Hospital Stay (HOSPITAL_COMMUNITY): Admission: EM | Admit: 2009-12-26 | Discharge: 2009-12-29 | Payer: Self-pay | Admitting: Emergency Medicine

## 2010-01-02 IMAGING — CR DG CHEST 2V
2 series · 2 of 2 positions shown · non-contrast
Comparison: Chest radiograph 08/05/2008.  Chest CTA 04/16/2008

CLINICAL DATA: Chest pain

CHEST - 2 VIEW

[w chest pa]
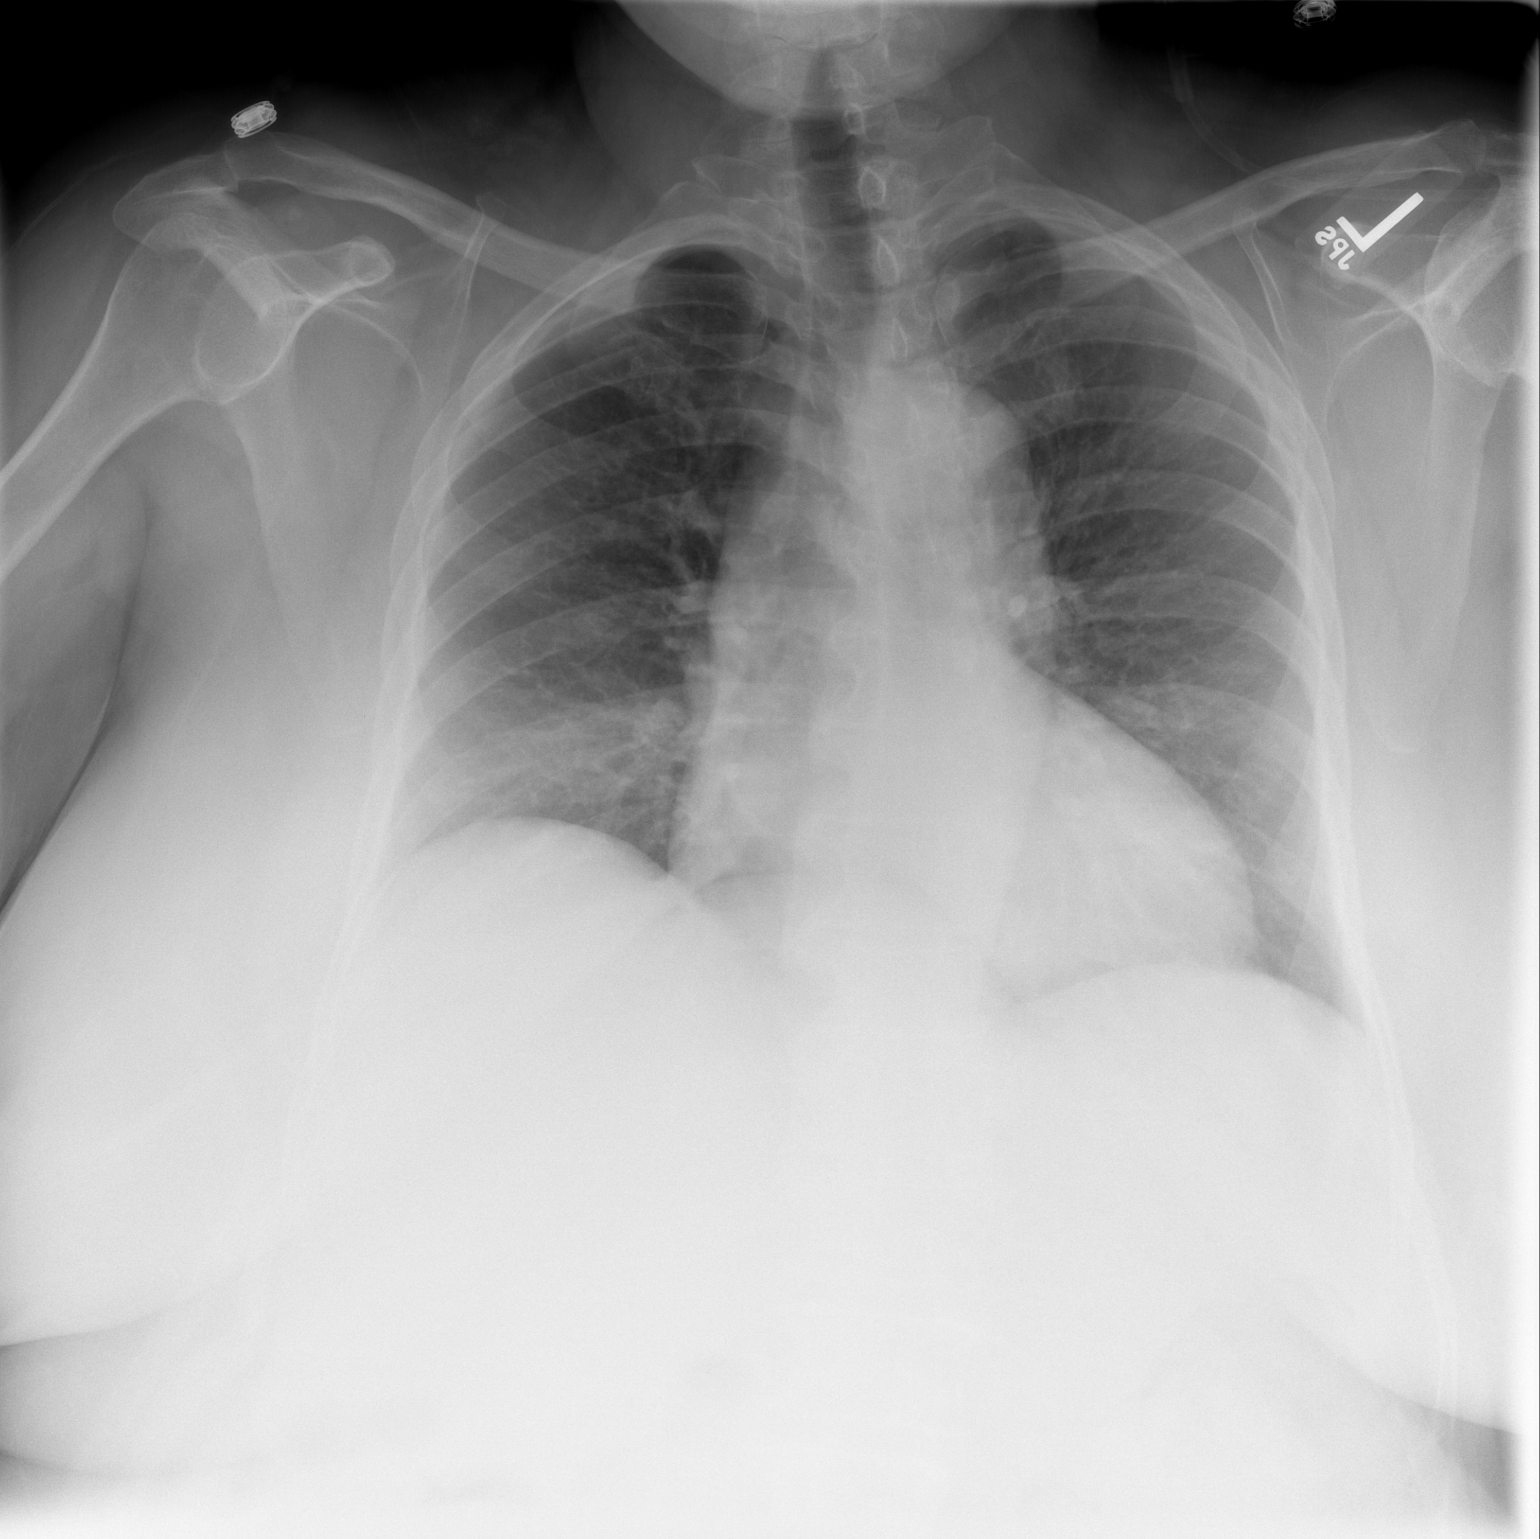

[w chest lat]
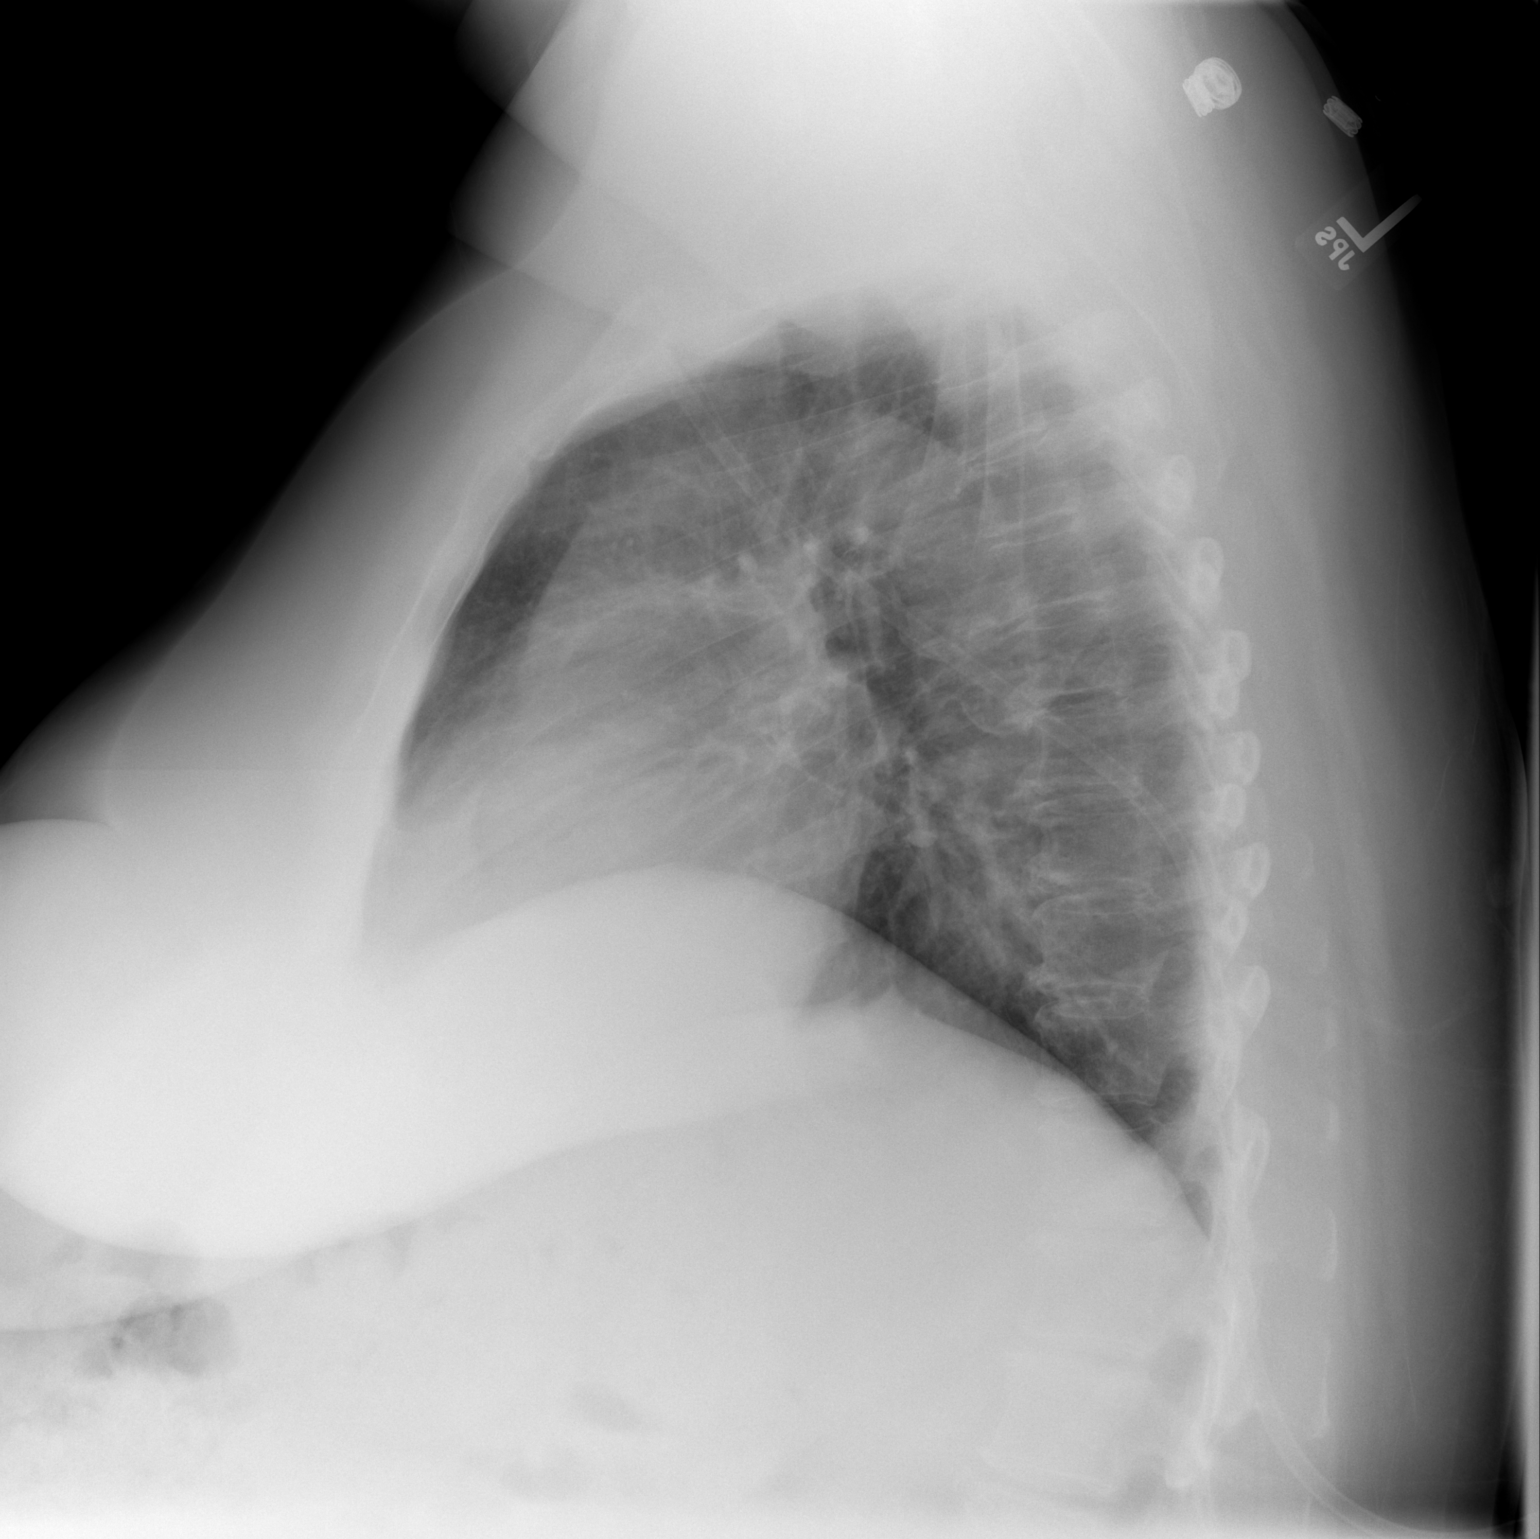

[2 of 2 positions shown; findings below may reference images not displayed]

FINDINGS: The patient slightly rotated to the right.  Heart size
mildly enlarged and stable. Slight pulmonary vascular congestion
centrally.  An asymmetric faint opacity at the right lung base is
seen on the frontal view.  No pleural effusion or pneumothorax.
IMPRESSION: 1. Stable mild cardiomegaly.  Mild central pulmonary vascular
congestion.
2.  Faint opacity right lung base seen on the frontal view.  This
is favored to be due to atelectasis versus overlap of normal
shadows.  Early airspace disease is not excluded.

## 2010-01-05 ENCOUNTER — Encounter: Payer: Self-pay | Admitting: Physician Assistant

## 2010-01-11 ENCOUNTER — Ambulatory Visit (HOSPITAL_BASED_OUTPATIENT_CLINIC_OR_DEPARTMENT_OTHER): Admission: RE | Admit: 2010-01-11 | Discharge: 2010-01-11 | Payer: Self-pay | Admitting: Pulmonary Disease

## 2010-01-11 ENCOUNTER — Encounter: Payer: Self-pay | Admitting: Pulmonary Disease

## 2010-01-16 ENCOUNTER — Inpatient Hospital Stay (HOSPITAL_COMMUNITY): Admission: EM | Admit: 2010-01-16 | Discharge: 2010-01-18 | Payer: Self-pay | Admitting: Emergency Medicine

## 2010-01-17 ENCOUNTER — Encounter (INDEPENDENT_AMBULATORY_CARE_PROVIDER_SITE_OTHER): Payer: Self-pay

## 2010-01-17 ENCOUNTER — Ambulatory Visit (HOSPITAL_COMMUNITY): Admission: RE | Admit: 2010-01-17 | Discharge: 2010-01-17 | Payer: Self-pay | Admitting: General Surgery

## 2010-01-20 ENCOUNTER — Ambulatory Visit: Payer: Self-pay | Admitting: Pulmonary Disease

## 2010-01-22 ENCOUNTER — Telehealth (INDEPENDENT_AMBULATORY_CARE_PROVIDER_SITE_OTHER): Payer: Self-pay | Admitting: *Deleted

## 2010-01-22 DIAGNOSIS — G4733 Obstructive sleep apnea (adult) (pediatric): Secondary | ICD-10-CM | POA: Insufficient documentation

## 2010-02-03 ENCOUNTER — Encounter: Payer: Self-pay | Admitting: Pulmonary Disease

## 2010-02-04 IMAGING — CR DG CHEST 1V PORT
1 series · 1 of 1 positions shown · non-contrast
Comparison: PA and lateral chest 09/04/2008 06/17/2008.

CLINICAL DATA: Chest pain.

PORTABLE CHEST - 1 VIEW

[AP]
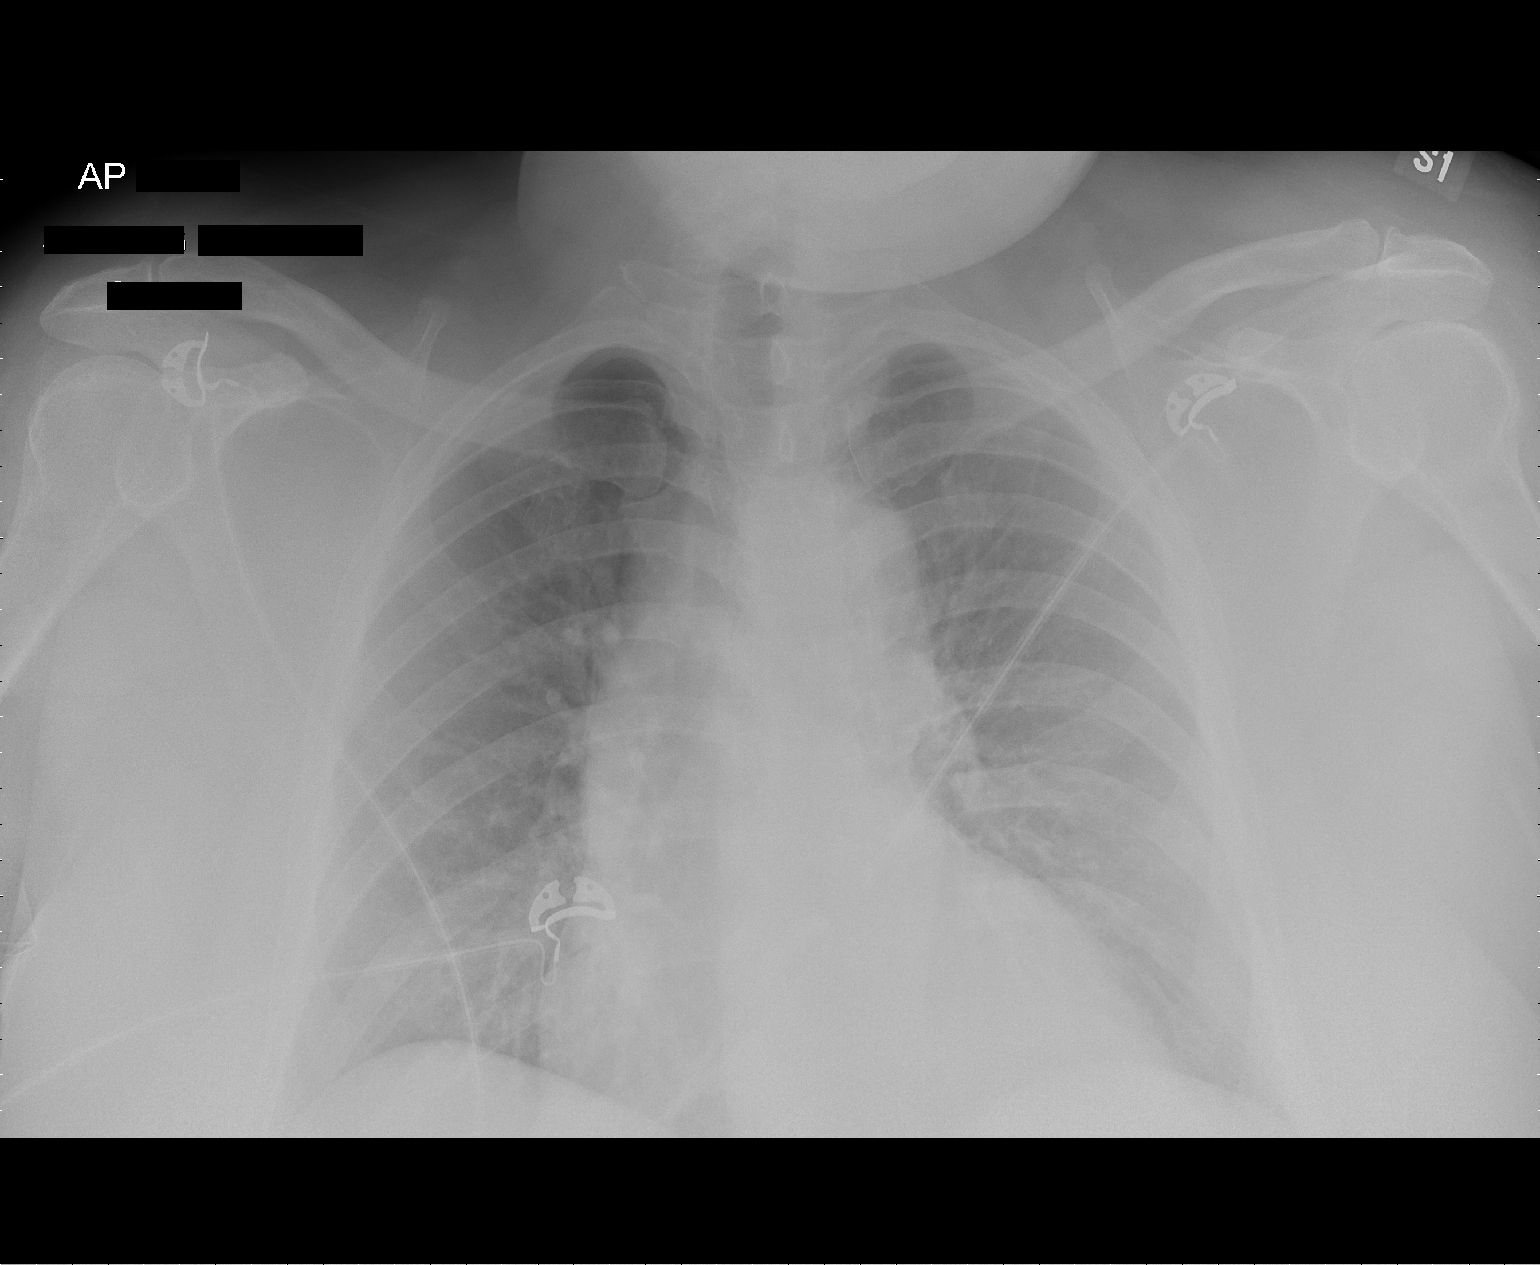

[1 of 1 positions shown; findings below may reference images not displayed]

FINDINGS: There is cardiomegaly but no pulmonary edema.  Lungs
clear.  No effusion.
IMPRESSION: Cardiomegaly without acute disease.

## 2010-02-12 ENCOUNTER — Encounter: Payer: Self-pay | Admitting: Physician Assistant

## 2010-02-18 ENCOUNTER — Emergency Department (HOSPITAL_COMMUNITY): Admission: EM | Admit: 2010-02-18 | Discharge: 2010-02-18 | Payer: Self-pay | Admitting: Emergency Medicine

## 2010-02-26 ENCOUNTER — Telehealth: Payer: Self-pay | Admitting: Physician Assistant

## 2010-03-08 ENCOUNTER — Encounter: Payer: Self-pay | Admitting: Physician Assistant

## 2010-03-13 ENCOUNTER — Emergency Department (HOSPITAL_COMMUNITY): Admission: EM | Admit: 2010-03-13 | Discharge: 2010-03-13 | Payer: Self-pay | Admitting: Emergency Medicine

## 2010-03-29 ENCOUNTER — Emergency Department (HOSPITAL_COMMUNITY): Admission: EM | Admit: 2010-03-29 | Discharge: 2010-03-29 | Payer: Self-pay | Admitting: Emergency Medicine

## 2010-03-29 ENCOUNTER — Encounter: Payer: Self-pay | Admitting: Physician Assistant

## 2010-03-31 ENCOUNTER — Ambulatory Visit: Payer: Self-pay | Admitting: Pulmonary Disease

## 2010-04-06 ENCOUNTER — Telehealth: Payer: Self-pay | Admitting: Physician Assistant

## 2010-04-06 ENCOUNTER — Encounter (INDEPENDENT_AMBULATORY_CARE_PROVIDER_SITE_OTHER): Payer: Self-pay | Admitting: *Deleted

## 2010-04-21 ENCOUNTER — Encounter: Payer: Self-pay | Admitting: Physician Assistant

## 2010-04-21 ENCOUNTER — Telehealth (INDEPENDENT_AMBULATORY_CARE_PROVIDER_SITE_OTHER): Payer: Self-pay | Admitting: Nurse Practitioner

## 2010-04-23 IMAGING — CR DG CHEST 2V
2 series · 2 of 2 positions shown · non-contrast
Comparison: 10/07/2008

CLINICAL DATA: Chest pain.  Shortness of breath.  Hypertension.

CHEST - 2 VIEW

[w chest pa *]
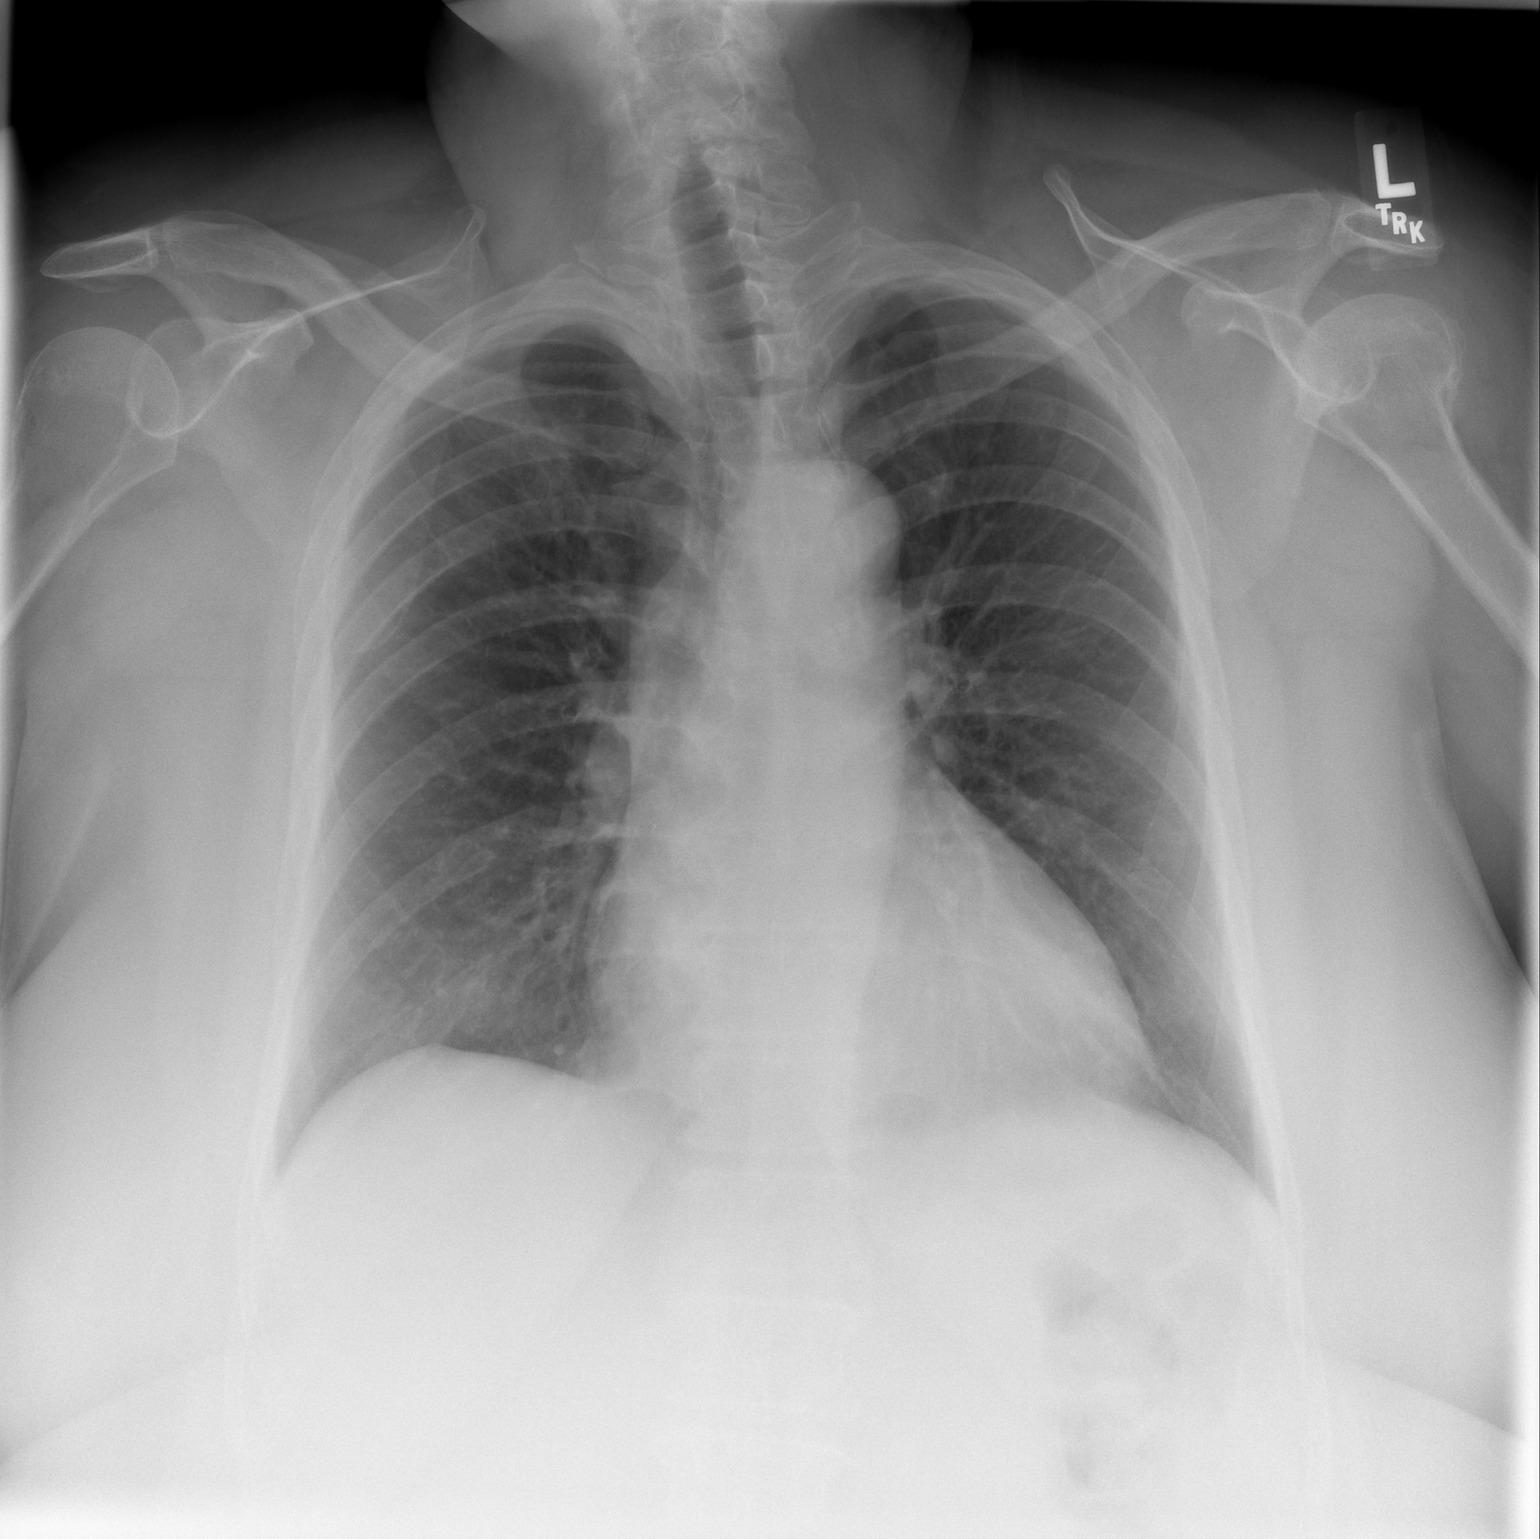

[w chest lat *]
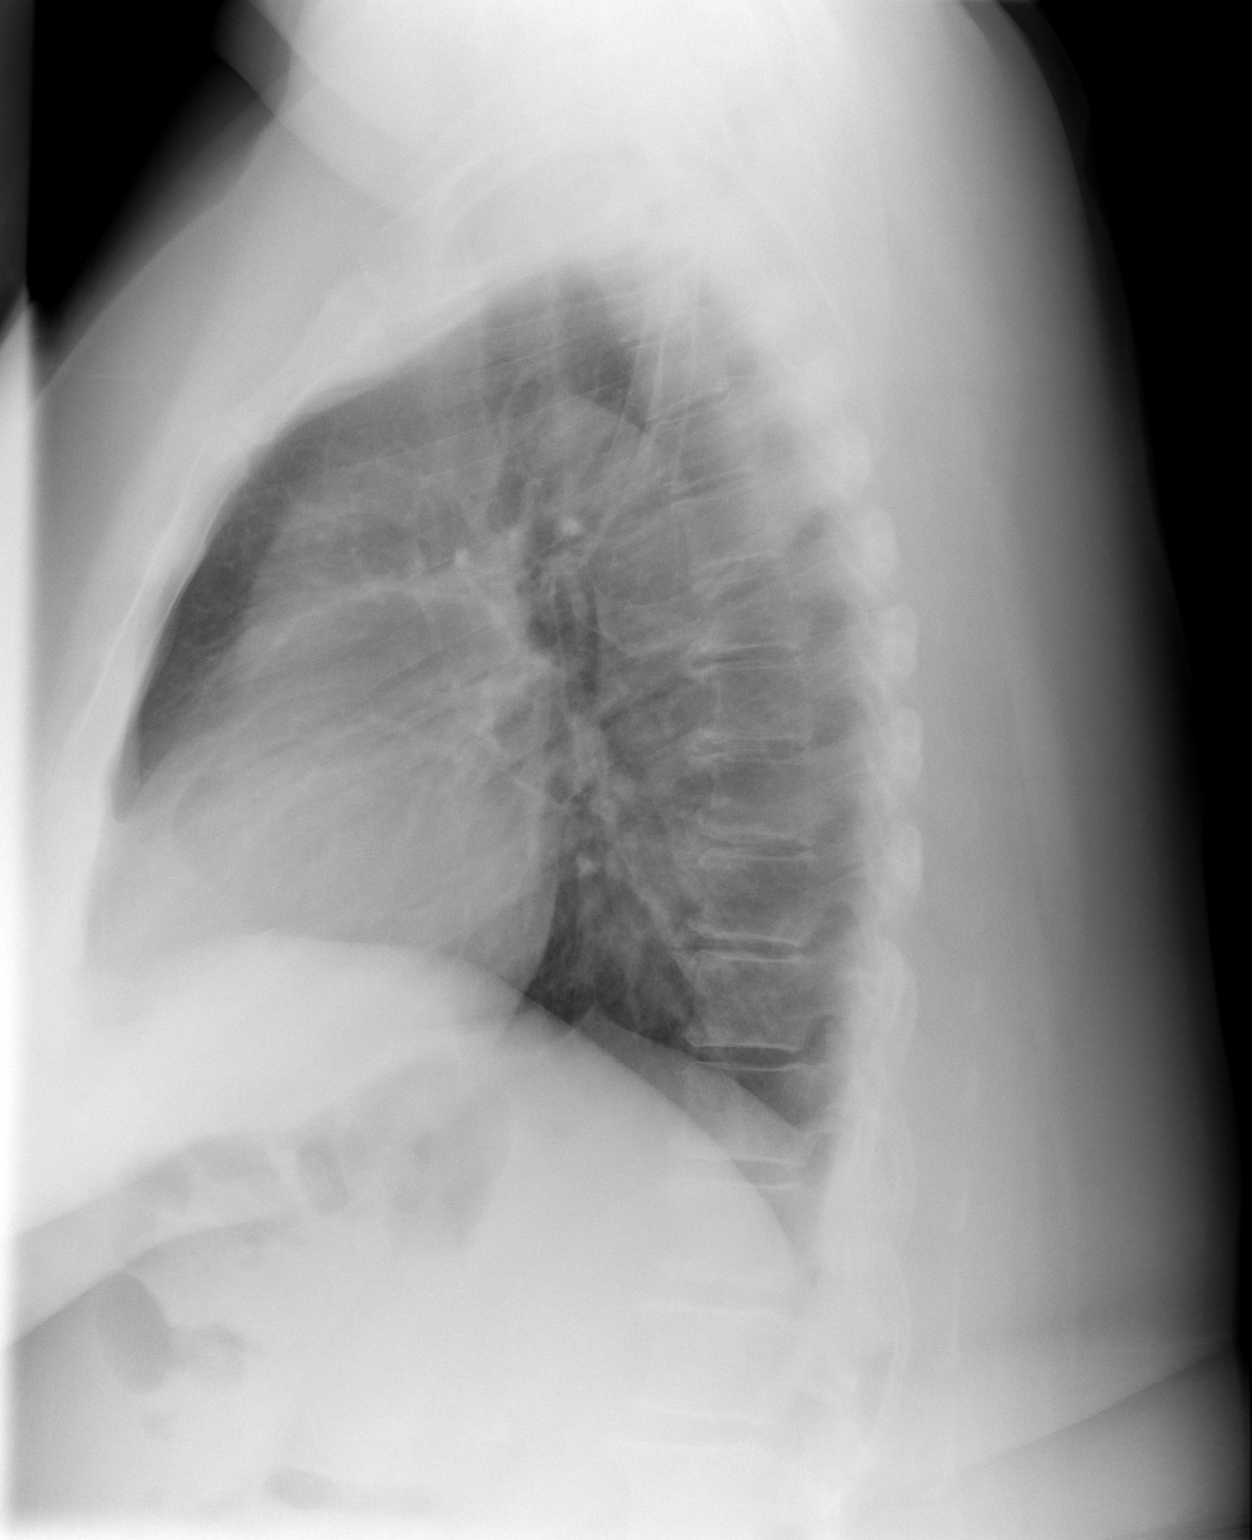

[2 of 2 positions shown; findings below may reference images not displayed]

FINDINGS: Mid thoracic spondylosis.  Mild patient rotation to the
right. Midline trachea. Borderline cardiomegaly.     Mediastinal
contours otherwise within normal limits.  Clear lungs.
IMPRESSION: 1. No acute cardiopulmonary disease.
2.  Borderline cardiomegaly without congestive failure.

## 2010-05-15 ENCOUNTER — Emergency Department (HOSPITAL_COMMUNITY): Admission: EM | Admit: 2010-05-15 | Discharge: 2010-05-15 | Payer: Self-pay | Admitting: Emergency Medicine

## 2010-05-19 ENCOUNTER — Other Ambulatory Visit: Payer: Self-pay | Admitting: Emergency Medicine

## 2010-05-19 ENCOUNTER — Telehealth (INDEPENDENT_AMBULATORY_CARE_PROVIDER_SITE_OTHER): Payer: Self-pay | Admitting: Nurse Practitioner

## 2010-05-19 ENCOUNTER — Ambulatory Visit: Payer: Self-pay | Admitting: Psychiatry

## 2010-05-19 ENCOUNTER — Inpatient Hospital Stay (HOSPITAL_COMMUNITY)
Admission: RE | Admit: 2010-05-19 | Discharge: 2010-05-20 | Payer: Self-pay | Source: Home / Self Care | Admitting: Psychiatry

## 2010-05-26 IMAGING — CR DG CHEST 1V PORT
1 series · 1 of 1 positions shown · non-contrast
Comparison: Chest radiograph 12/24/2008

CLINICAL DATA: Chest pain

PORTABLE CHEST - 1 VIEW

[AP]
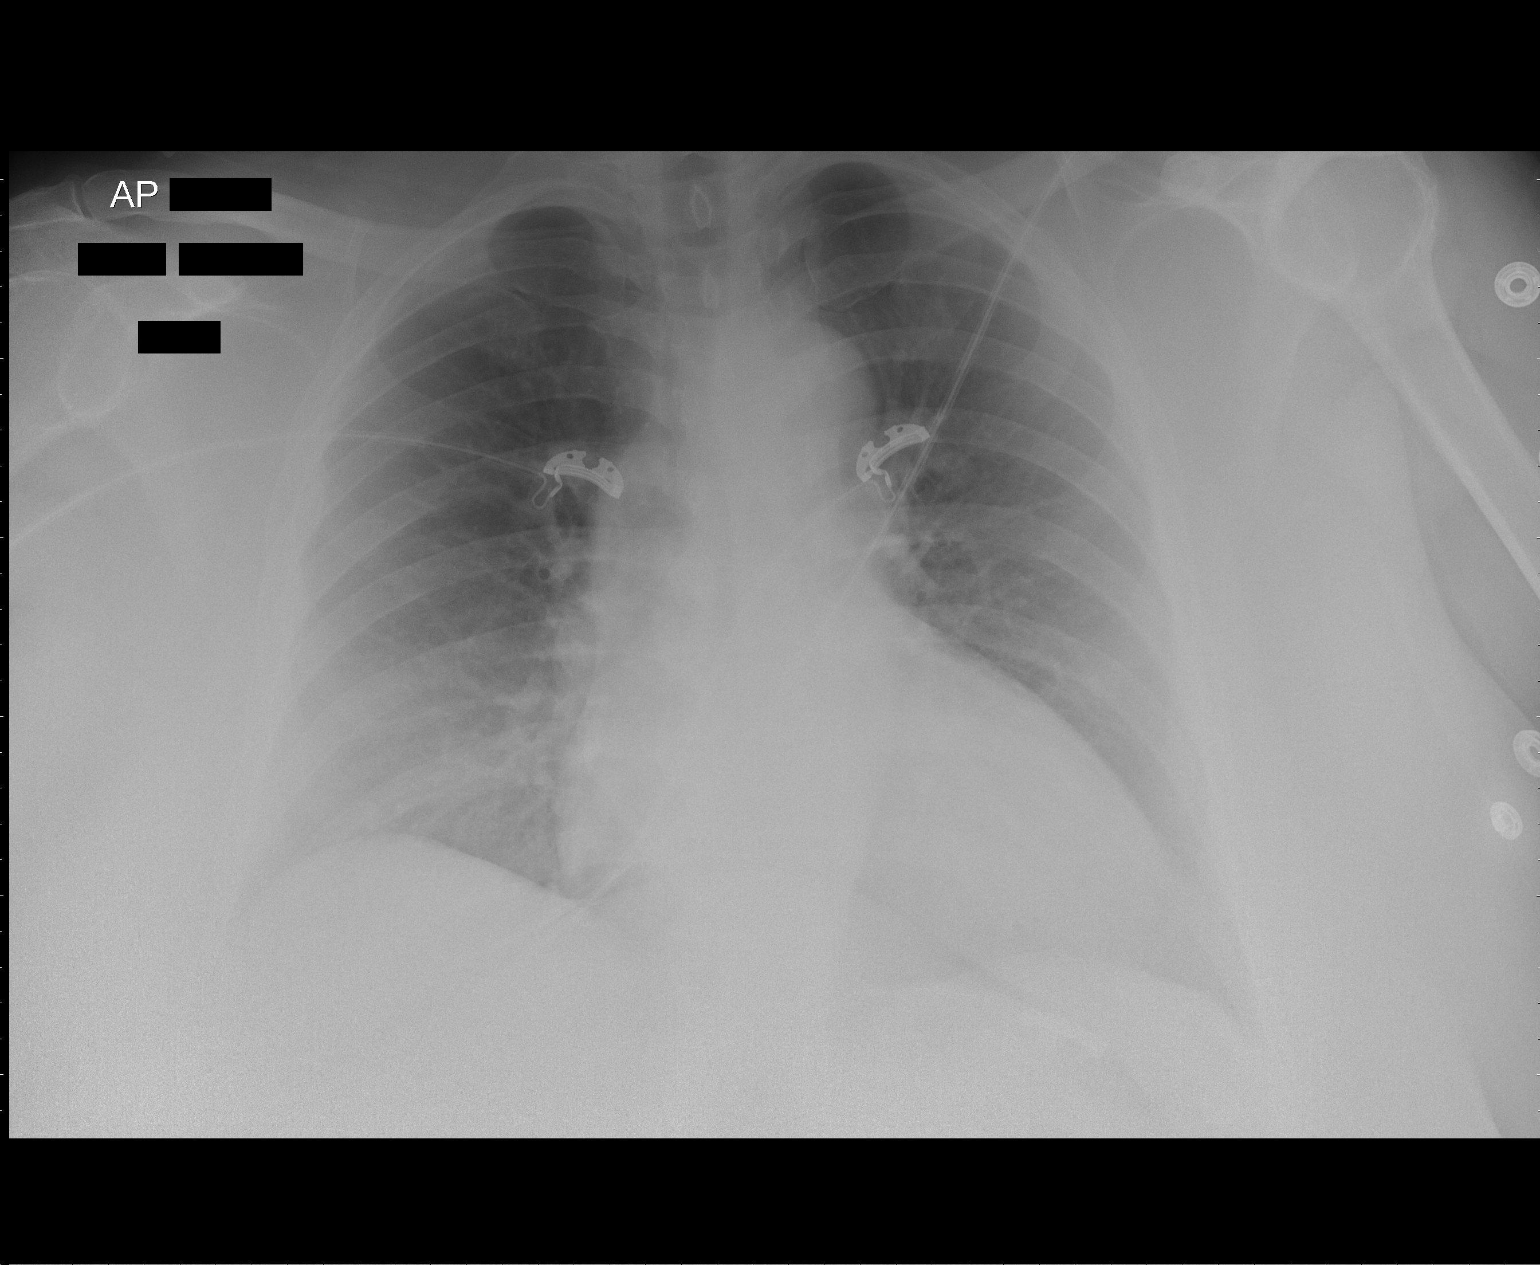

[1 of 1 positions shown; findings below may reference images not displayed]

FINDINGS: Stable mildly enlarged cardiac silhouette.  There are low
lung volumes.  No effusion, infiltrate, or pneumothorax.
IMPRESSION: Borderline cardiomegaly and low lung volumes.  No clear acute
process.

## 2010-05-28 ENCOUNTER — Telehealth (INDEPENDENT_AMBULATORY_CARE_PROVIDER_SITE_OTHER): Payer: Self-pay | Admitting: Nurse Practitioner

## 2010-06-08 ENCOUNTER — Ambulatory Visit: Payer: Self-pay | Admitting: Internal Medicine

## 2010-06-13 ENCOUNTER — Emergency Department (HOSPITAL_COMMUNITY)
Admission: EM | Admit: 2010-06-13 | Discharge: 2010-06-14 | Payer: Self-pay | Source: Home / Self Care | Admitting: Emergency Medicine

## 2010-06-13 ENCOUNTER — Encounter (INDEPENDENT_AMBULATORY_CARE_PROVIDER_SITE_OTHER): Payer: Self-pay | Admitting: Nurse Practitioner

## 2010-06-15 ENCOUNTER — Encounter (INDEPENDENT_AMBULATORY_CARE_PROVIDER_SITE_OTHER): Payer: Self-pay | Admitting: Nurse Practitioner

## 2010-06-20 ENCOUNTER — Emergency Department (HOSPITAL_COMMUNITY)
Admission: EM | Admit: 2010-06-20 | Discharge: 2010-06-21 | Payer: Self-pay | Source: Home / Self Care | Admitting: Emergency Medicine

## 2010-06-22 ENCOUNTER — Encounter (INDEPENDENT_AMBULATORY_CARE_PROVIDER_SITE_OTHER): Payer: Self-pay | Admitting: Nurse Practitioner

## 2010-06-27 IMAGING — CR DG CHEST 2V
1 series · 1 of 1 positions shown · non-contrast
Comparison: 01/26/2009

CLINICAL DATA: Chest pain, shortness of breath

CHEST - 2 VIEW

[view not recorded]
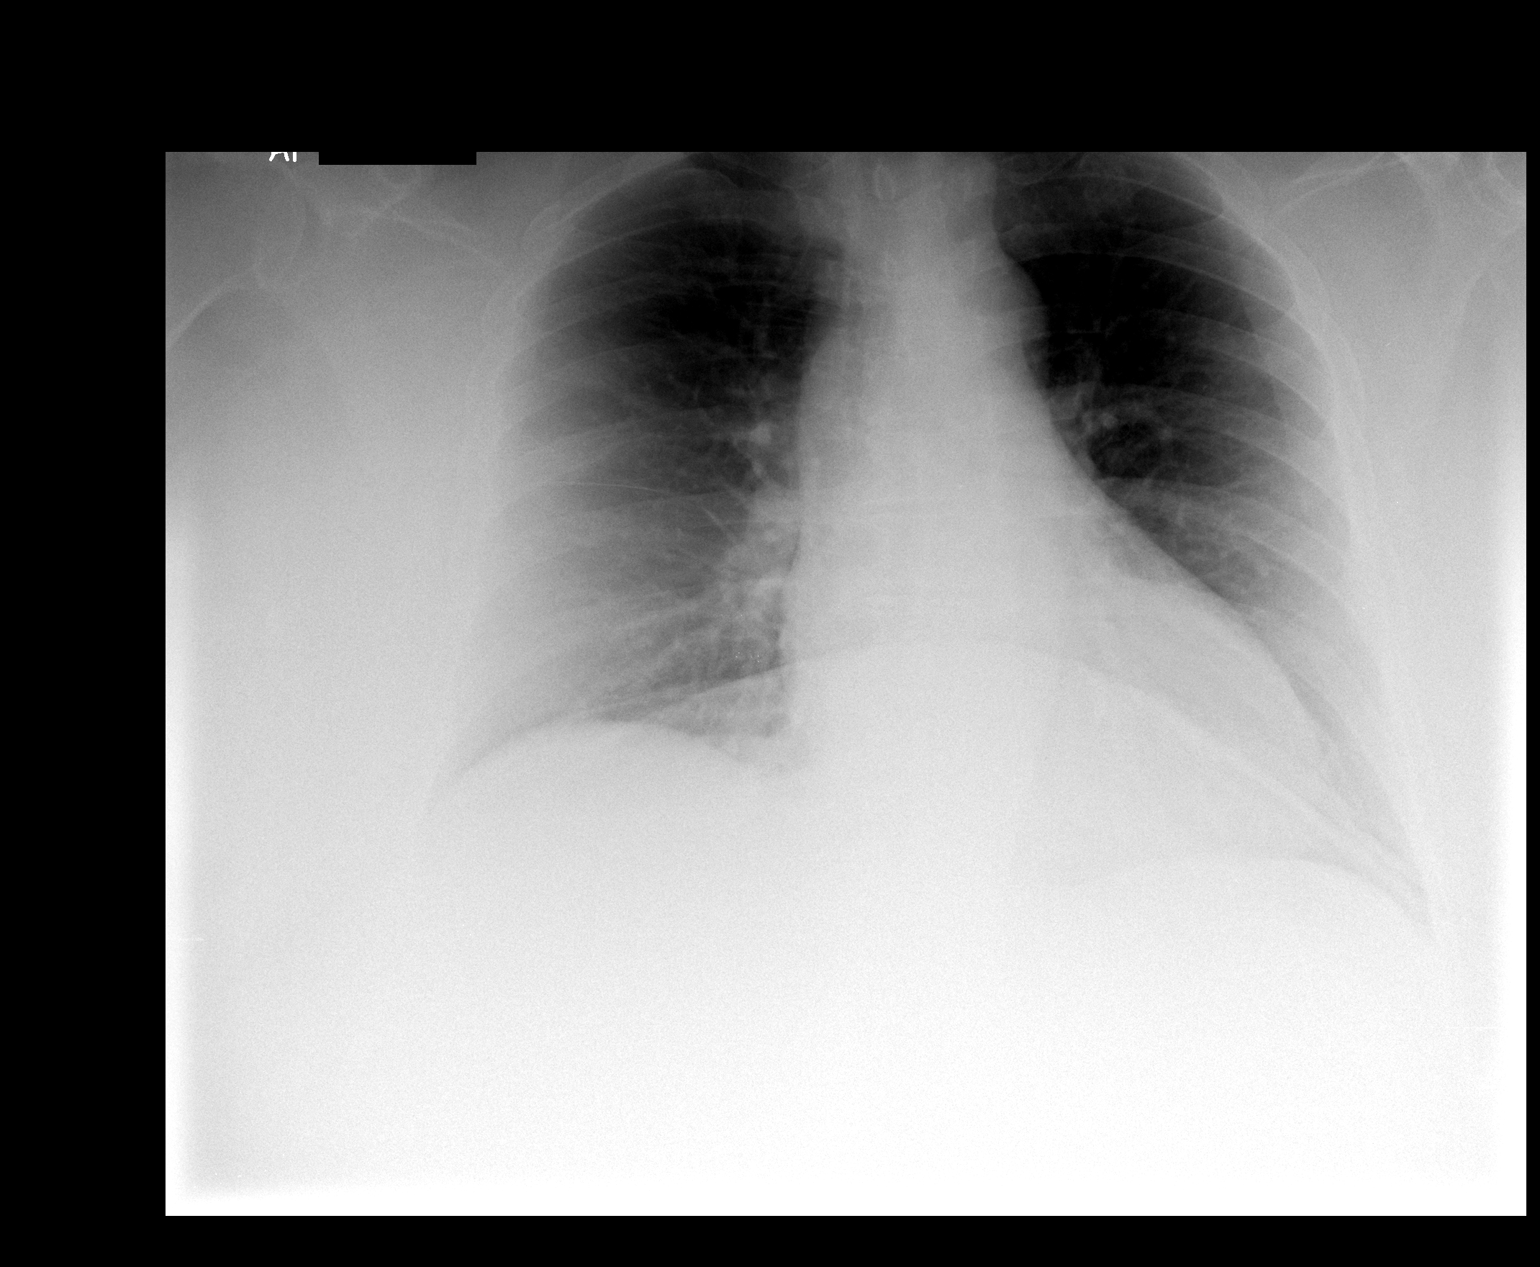

[1 of 1 positions shown; findings below may reference images not displayed]

FINDINGS: Upper normal heart size.
Normal mediastinal contours and pulmonary vascularity.
No gross infiltrate or effusion.
Low lung volumes, stable.
No gross pneumothorax.
IMPRESSION: No acute abnormalities.

## 2010-06-28 ENCOUNTER — Telehealth (INDEPENDENT_AMBULATORY_CARE_PROVIDER_SITE_OTHER): Payer: Self-pay | Admitting: Nurse Practitioner

## 2010-06-29 ENCOUNTER — Emergency Department (HOSPITAL_COMMUNITY)
Admission: EM | Admit: 2010-06-29 | Discharge: 2010-06-29 | Payer: Self-pay | Source: Home / Self Care | Admitting: Emergency Medicine

## 2010-07-01 ENCOUNTER — Encounter: Payer: Self-pay | Admitting: Pulmonary Disease

## 2010-07-05 LAB — POCT I-STAT, CHEM 8
BUN: 5 mg/dL — ABNORMAL LOW (ref 6–23)
Calcium, Ion: 1.21 mmol/L (ref 1.12–1.32)
Chloride: 99 mEq/L (ref 96–112)
Creatinine, Ser: 1.1 mg/dL (ref 0.4–1.2)
Glucose, Bld: 95 mg/dL (ref 70–99)
HCT: 39 % (ref 36.0–46.0)
Hemoglobin: 13.3 g/dL (ref 12.0–15.0)
Potassium: 4.1 mEq/L (ref 3.5–5.1)
Sodium: 136 mEq/L (ref 135–145)
TCO2: 29 mmol/L (ref 0–100)

## 2010-07-05 LAB — URINALYSIS, ROUTINE W REFLEX MICROSCOPIC
Bilirubin Urine: NEGATIVE
Ketones, ur: NEGATIVE mg/dL
Nitrite: POSITIVE — AB
Protein, ur: 30 mg/dL — AB
Specific Gravity, Urine: 1.012 (ref 1.005–1.030)
Urine Glucose, Fasting: NEGATIVE mg/dL
Urobilinogen, UA: 1 mg/dL (ref 0.0–1.0)
pH: 6.5 (ref 5.0–8.0)

## 2010-07-05 LAB — CBC
HCT: 37 % (ref 36.0–46.0)
Hemoglobin: 12 g/dL (ref 12.0–15.0)
MCH: 32.9 pg (ref 26.0–34.0)
MCHC: 32.4 g/dL (ref 30.0–36.0)
MCV: 101.4 fL — ABNORMAL HIGH (ref 78.0–100.0)
Platelets: 249 10*3/uL (ref 150–400)
RBC: 3.65 MIL/uL — ABNORMAL LOW (ref 3.87–5.11)
RDW: 13.5 % (ref 11.5–15.5)
WBC: 7.7 10*3/uL (ref 4.0–10.5)

## 2010-07-05 LAB — URINE MICROSCOPIC-ADD ON

## 2010-07-05 LAB — POCT CARDIAC MARKERS
CKMB, poc: <1 ng/mL — ABNORMAL LOW (ref 1.0–8.0)
Myoglobin, poc: 41.5 ng/mL (ref 12–200)
Troponin i, poc: <0.05 ng/mL (ref 0.00–0.09)

## 2010-07-10 ENCOUNTER — Emergency Department (HOSPITAL_COMMUNITY)
Admission: EM | Admit: 2010-07-10 | Discharge: 2010-07-11 | Payer: Self-pay | Source: Home / Self Care | Admitting: Emergency Medicine

## 2010-07-11 ENCOUNTER — Encounter: Payer: Self-pay | Admitting: Family Medicine

## 2010-07-11 ENCOUNTER — Encounter: Payer: Self-pay | Admitting: Internal Medicine

## 2010-07-11 ENCOUNTER — Encounter: Payer: Self-pay | Admitting: Hematology and Oncology

## 2010-07-12 ENCOUNTER — Encounter: Payer: Self-pay | Admitting: Occupational Therapy

## 2010-07-13 ENCOUNTER — Encounter (INDEPENDENT_AMBULATORY_CARE_PROVIDER_SITE_OTHER): Payer: Self-pay | Admitting: Internal Medicine

## 2010-07-13 ENCOUNTER — Ambulatory Visit
Admission: RE | Admit: 2010-07-13 | Discharge: 2010-07-13 | Payer: Self-pay | Source: Home / Self Care | Attending: Internal Medicine | Admitting: Internal Medicine

## 2010-07-13 DIAGNOSIS — D649 Anemia, unspecified: Secondary | ICD-10-CM | POA: Insufficient documentation

## 2010-07-13 DIAGNOSIS — R079 Chest pain, unspecified: Secondary | ICD-10-CM | POA: Insufficient documentation

## 2010-07-13 LAB — POCT CARDIAC MARKERS
CKMB, poc: <1 ng/mL — ABNORMAL LOW (ref 1.0–8.0)
CKMB, poc: <1 ng/mL — ABNORMAL LOW (ref 1.0–8.0)
Troponin i, poc: <0.05 ng/mL (ref 0.00–0.09)
Troponin i, poc: <0.05 ng/mL (ref 0.00–0.09)

## 2010-07-13 LAB — CONVERTED CEMR LAB
HDL: 44 mg/dL (ref >39)
LDL Cholesterol: 106 mg/dL — ABNORMAL HIGH (ref 0–99)
RBC Folate: 1010 ng/mL — ABNORMAL HIGH (ref 180–600)
Total CHOL/HDL Ratio: 4
Triglycerides: 135 mg/dL (ref <150)
UIBC: 279 ug/dL
VLDL: 27 mg/dL (ref 0–40)

## 2010-07-13 LAB — POCT I-STAT, CHEM 8
BUN: 10 mg/dL (ref 6–23)
Chloride: 93 mEq/L — ABNORMAL LOW (ref 96–112)
Potassium: 4.6 mEq/L (ref 3.5–5.1)
Sodium: 129 mEq/L — ABNORMAL LOW (ref 135–145)

## 2010-07-13 LAB — DIFFERENTIAL
Lymphocytes Relative: 48 % — ABNORMAL HIGH (ref 12–46)
Lymphs Abs: 2.8 10*3/uL (ref 0.7–4.0)
Monocytes Relative: 10 % (ref 3–12)
Neutro Abs: 2.4 10*3/uL (ref 1.7–7.7)
Neutrophils Relative %: 41 % — ABNORMAL LOW (ref 43–77)

## 2010-07-13 LAB — CBC
Hemoglobin: 10.6 g/dL — ABNORMAL LOW (ref 12.0–15.0)
MCH: 32.2 pg (ref 26.0–34.0)
MCV: 98.8 fL (ref 78.0–100.0)
RBC: 3.29 MIL/uL — ABNORMAL LOW (ref 3.87–5.11)
WBC: 5.8 10*3/uL (ref 4.0–10.5)

## 2010-07-19 ENCOUNTER — Encounter (INDEPENDENT_AMBULATORY_CARE_PROVIDER_SITE_OTHER): Payer: Self-pay | Admitting: *Deleted

## 2010-07-19 ENCOUNTER — Telehealth (INDEPENDENT_AMBULATORY_CARE_PROVIDER_SITE_OTHER): Payer: Self-pay | Admitting: Internal Medicine

## 2010-07-21 ENCOUNTER — Encounter (INDEPENDENT_AMBULATORY_CARE_PROVIDER_SITE_OTHER): Payer: Self-pay | Admitting: Internal Medicine

## 2010-07-22 NOTE — Progress Notes (Signed)
Summary: OMEPRAZOLE NEEDS AUTHORIZATION  Phone Note Call from Patient Call back at Home Phone [phone]   Reason for Call: Refill Medication Summary of Call: Laine Fonner PT. MS Esqueda CALLED AND SAYS THAT SHE NEEDS PRIOR AUTHORIZATION ON HER OMEPRAZOLE AT [facility]. Initial call taken by: Leodis Rains,  Oct 21, 2009 11:16 AM  Follow-up for Phone Call        forward to provider Follow-up by: [place] Shannon,  Oct 22, 2009 8:57 AM  Additional Follow-up for Phone Call Additional follow up Details #1::        I put request in your basket last week. Please call for PA. Additional Follow-up by: Tereso Newcomer PA-C,  Oct 22, 2009 2:11 PM    Additional Follow-up for Phone Call Additional follow up Details #2::    ok Follow-up by: [place] Shannon,  Oct 26, 2009 4:27 PM

## 2010-07-22 NOTE — Assessment & Plan Note (Signed)
Summary: sleep consult/ mbw   Visit Type:  Initial Consult Copy to:  Dr. Myrtice Lauth Primary Provider/Referring Provider:  Tereso Newcomer PA-C  CC:  Sleep consult.  Epworth score is 17.[name]  History of Present Illness: 60 yo female for sleep evaluation.  She was recently in the hospital for her gallbladder.  During her hospitalization concern was raised about whether she may have sleep apnea.  Sleep consultation was therefore arranged.  She goes to bed at 1030 pm.  She takes seroquel, trazadone, and ambien before going to bed.  She also takes her oxycodone before going to bed.  She will wake up after a few hours.  She will have to use the bathroom about 5 times per night.  She gets out of bed at 1230 pm.  She feels drowsy and gets headaches in the morning.  She does snore while asleep, and wakes up with a choking sensation.  She does get occasional nightmares, and talks in her sleep.  She denies sleep walking, or bruxism.  She occasionally gets funny feeling in her legs, but this does not usually cause problems with her sleep.  She denies sleep hallucinations, sleep paralysis, or cataplexy.  She no longer smokes cigarettes, and denies alcohol use.  She does drink lots of soda and tea.  She has one cup of coffee in the morning.  Her weight has been steady.   Preventive Screening-Counseling & Management  Alcohol-Tobacco     Smoking Status: never  Current Medications (verified): 1)  Synthroid 100 Mcg  Tabs (Levothyroxine Sodium) .... Take 1 Tablet By Mouth Once A Day (Dr.harwani) 2)  Folic Acid 1 Mg  Tabs (Folic Acid) .[name].. 1 By Mouth Qday 3)  Lopressor 50 Mg  Tabs (Metoprolol Tartrate) .[name].. 1 By Mouth Bid 4)  Depakote Er 500 Mg  Tb24 (Divalproex Sodium) .... 2 By Mouth At Bedtime (Per [facility]) 5)  Benztropine Mesylate 0.5 Mg  Tabs (Benztropine Mesylate) .[name].. 1 By Mouth Two Times A Day 6)  [name] 6 Mg  Tb24 (Paliperidone) .[name].. 1 By Mouth in Morning(Per [facility]) 7)  Nitroquick 0.4 Mg   Subl (Nitroglycerin) .[name].. 1sl Q11min As Needed(Dr.harwani) 8)  Nabumetone 500 Mg  Tabs (Nabumetone) .... Take 1 Tablet By Mouth Every 12 Hours As Needed For Back Pain 9)  Omeprazole 20 Mg Cpdr (Omeprazole) .... Take 1 Capsule By Mouth Two Times A Day (Pharmacy Note Change in Dose and Sig) 10)  Seroquel 300 Mg Tabs (Quetiapine Fumarate) .... Take 2 Tablets By Mouth At Bedtime(Per [facility]) 11)  Lisinopril 40 Mg Tabs (Lisinopril) .... Take 1 Tablet By Mouth Every 12 Hours(Dr.harwani) 12)  Fluticasone Propionate 50 Mcg/act Susp (Fluticasone Propionate) .... 2 Sp Each Nostril Q Am 13)  Advair Diskus 100-50 Mcg/dose Misc (Fluticasone-Salmeterol) .[name].. 1 Puff 2 Times Daily 14)  Trazodone Hcl 100 Mg Tabs (Trazodone Hcl) .... Two Tabs At Bedtime 15)  Amlodipine Besylate 5 Mg Tabs (Amlodipine Besylate) .... One Tab Each Day 16)  Oxycodone-Acetaminophen 5-325 Mg Tabs (Oxycodone-Acetaminophen) .[name].. 1 By Mouth Every 6 Hours As Needed 17)  Zolpidem Tartrate 5 Mg Tabs (Zolpidem Tartrate) .[name].. 1 By Mouth At Bedtime As Needed 18)  Gemfibrozil 600 Mg Tabs (Gemfibrozil) .[name].. 1 By Mouth Two Times A Day  Allergies (verified): 1)  ! Pcn 2)  ! Bactrim  Past History:  Past Medical History: Coronary artery disease Hypertension Systolic dysfunction Hyperlipidemia Diabetes mellitus, type II Morbid obesity Hypothyroidism GERD Hiatal hernia Osteoarthritis Paranoid schizophrenia Asthma Cholelithiasis  Past Surgical  History: Hysterectomy (03/20/1992) Tubal ligation (06/20/1976)  Family History: Family History Breast Cancer---maternal aunt Heart disease---father  Social History: Patient never smoked.  Lives alone DisabledSmoking Status:  never  Echocardiogram  Procedure date:  10/27/2009  Findings:       Study Conclusions    - Left ventricle: The cavity size was normal. Systolic function was     mildly reduced. The estimated ejection fraction was in the range     of 45% to 50%. Diffuse  hypokinesis.   - Left atrium: The atrium was mildly dilated.  CXR  Procedure date:  10/27/2009  Findings:       CHEST - 2 VIEW    Comparison: 10/07/2009.    Findings: Borderline heart size for projection.  No airspace   disease.  No effusion.  Left lung base excluded from view on the   frontal.  Medial right basilar atelectasis.    IMPRESSION:   Borderline heart size without acute cardiopulmonary disease.   Review of Systems       The patient complains of shortness of breath with activity, chest pain, loss of appetite, weight change, abdominal pain, headaches, and hand/feet swelling.  The patient denies shortness of breath at rest, productive cough, non-productive cough, coughing up blood, irregular heartbeats, acid heartburn, indigestion, difficulty swallowing, sore throat, tooth/dental problems, nasal congestion/difficulty breathing through nose, sneezing, itching, ear ache, anxiety, depression, joint stiffness or pain, rash, change in color of mucus, and fever.    Vital Signs:  Patient profile:   60 year old female Height:      61 inches (154.94 cm) Weight:      332 pounds O2 Sat:      97 % on Room air Temp:     98.3 degrees F (36.83 degrees C) oral Pulse rate:   93 / minute BP sitting:   114 / 88  (right arm) Cuff size:   large  Vitals Entered By: Michel Bickers CMA (Nov 17, 2009 1:55 PM)  O2 Sat at Rest %:  93 O2 Flow:  Room air CC: Sleep consult.  Epworth score is 17. Comments Medications reviewed. Daytime phone number verified. Michel Bickers CMA  Nov 17, 2009 1:56 PM   Physical Exam  General:  obese.   Eyes:  PERRLA and EOMI.   Nose:  no deformity, discharge, inflammation, or lesions Mouth:  MP 3 Neck:  no JVD.   Lungs:  diminished breath sounds, no wheezing or rales Heart:  regular rhythm, normal rate, and no murmurs.   Abdomen:  obese, soft, non-tender Msk:  normal gait.   Pulses:  pulses normal Extremities:  2+ leg edema Neurologic:  normal CN II-XII and  strength normal.   Cervical Nodes:  no significant adenopathy Psych:  anxious.     Impression & Recommendations:  Problem # 1:  HYPERSOMNIA (ICD-780.54) She has symptoms and physical findings suggestive of sleep apnea.  She also has a history of cardiovascular disease.    I explained how sleep apnea can affect her health.  Driving precautions were reviewed.  To further assess will arrange for sleep test.  Problem # 2:  ASTHMA (ICD-493.90) Advised her to follow up with her primary care physician for further assessment of this.  Problem # 3:  OBESITY (ICD-278.00) Explained how her weight is affecting her health, and the need to start a weight loss regimen.  Problem # 4:  INSOMNIA (ICD-780.52) Her difficulty with sleep onset and sleep maintenance may actually be related to sleep disordered breathing.  Will re-assess after review of her sleep test to determine if she can come off some of her sleep aides.  Medications Added to Medication List This Visit: 1)  Oxycodone-acetaminophen 5-325 Mg Tabs (Oxycodone-acetaminophen) .[name].. 1 by mouth every 6 hours as needed 2)  Zolpidem Tartrate 5 Mg Tabs (Zolpidem tartrate) .[name].. 1 by mouth at bedtime as needed 3)  Gemfibrozil 600 Mg Tabs (Gemfibrozil) .[name].. 1 by mouth two times a day  Complete Medication List: 1)  Synthroid 100 Mcg Tabs (Levothyroxine sodium) .... Take 1 tablet by mouth once a day (dr.harwani) 2)  Folic Acid 1 Mg Tabs (Folic acid) .[name].. 1 by mouth qday 3)  Lopressor 50 Mg Tabs (Metoprolol tartrate) .[name].. 1 by mouth bid 4)  Depakote Er 500 Mg Tb24 (Divalproex sodium) .... 2 by mouth at bedtime (per [facility]) 5)  Benztropine Mesylate 0.5 Mg Tabs (Benztropine mesylate) .[name].. 1 by mouth two times a day 6)  [name] 6 Mg Tb24 (Paliperidone) .[name].. 1 by mouth in morning(per [facility]) 7)  Nitroquick 0.4 Mg Subl (Nitroglycerin) .[name].. 1sl q25min as needed(dr.harwani) 8)  Nabumetone 500 Mg Tabs (Nabumetone) .... Take 1 tablet by mouth every 12 hours as needed  for back pain 9)  Omeprazole 20 Mg Cpdr (Omeprazole) .... Take 1 capsule by mouth two times a day (pharmacy note change in dose and sig) 10)  Seroquel 300 Mg Tabs (Quetiapine fumarate) .... Take 2 tablets by mouth at bedtime(per [facility]) 11)  Lisinopril 40 Mg Tabs (Lisinopril) .... Take 1 tablet by mouth every 12 hours(dr.harwani) 12)  Fluticasone Propionate 50 Mcg/act Susp (Fluticasone propionate) .... 2 sp each nostril q am 13)  Advair Diskus 100-50 Mcg/dose Misc (Fluticasone-salmeterol) .[name].. 1 puff 2 times daily 14)  Trazodone Hcl 100 Mg Tabs (Trazodone hcl) .... Two tabs at bedtime 15)  Amlodipine Besylate 5 Mg Tabs (Amlodipine besylate) .... One tab each day 16)  Oxycodone-acetaminophen 5-325 Mg Tabs (Oxycodone-acetaminophen) .[name].. 1 by mouth every 6 hours as needed 17)  Zolpidem Tartrate 5 Mg Tabs (Zolpidem tartrate) .[name].. 1 by mouth at bedtime as needed 18)  Gemfibrozil 600 Mg Tabs (Gemfibrozil) .[name].. 1 by mouth two times a day  Other Orders: Consultation Level IV ([address]) Sleep Disorder Referral (Sleep Disorder)  Patient Instructions: 1)  Will schedule sleep test 2)  Will call to schedule follow up after sleep test is reviewed

## 2010-07-22 NOTE — Miscellaneous (Signed)
Summary: Sleep study report  Clinical Lists Changes AHI 14.4, oxygen nadir 74%.  PLMI 11.7.  CPAP 10 cm h2o with AHI down to 4.5.  Observed in REM and supine sleep.  Attempted to contact patient, but listed number has been temporarily disconnected.  Will have my nurse contact patient to schedule next available ROV to review results.  Appended Document: Sleep study report [facility] at [phone].  Appended Document: Sleep study report Patient had gallbladder surgery on Monday, 01/18/2010 and says she cannot come into the office for a while. She is requesting that you call her with the results.  She can be reached at [phone].  Appended Document: Sleep study report [facility] at [phone].

## 2010-07-22 NOTE — Letter (Signed)
Summary: CALL A NURSE  CALL A NURSE   Imported By: Arta Bruce 12/18/2009 14:36:30  _____________________________________________________________________  External Attachment:    Type:   Image     Comment:   External Document

## 2010-07-22 NOTE — Letter (Signed)
Summary: *HSN Results Follow up  [facility]  [address] [place], [place] [address]   Phone: [phone]  Fax: [phone]      04/06/2010   TYSHA GRISMORE [address] [place], [place]  [address]   Dear  Ms. Rachel Grossi,                            ____S.Drinkard,FNP   ____D. Gore,FNP       ____B. McPherson,MD   ____V. Rankins,MD    ____E. Mulberry,MD    ____N. Daphine Deutscher, FNP  ____D. Reche Dixon, MD    ____K. [place] Deputy, MD    ____Other     This letter is to inform you that your recent test(s):  _______Pap Smear    _______Lab Test     _______X-ray    _______ is within acceptable limits  _______ requires a medication change  _______ requires a follow-up lab visit  ___X____ requires a follow-up visit with your Aven Cegielski   Comments: We have been trying to reach you on [phone].  Please call us to scheudle appointment.       _________________________________________________________ If you have any questions, please contact our office                     Sincerely,  Arm[place] Shannon [facility]

## 2010-07-22 NOTE — Progress Notes (Signed)
Summary: Needs appt  Phone Note Outgoing Call   Summary of Call: Recent visit to ED for weakness. She needs to f/u in office.  Initial call taken by: Brynda Rim,  April 06, 2010 12:04 PM  Follow-up for Phone Call        number is disconnected. will mail letter.Marland KitchenMarland KitchenArmenia Shannon  April 06, 2010 12:08 PM

## 2010-07-22 NOTE — Progress Notes (Signed)
Summary: returning call  Phone Note Call from Patient   Caller: [email or web address] Call For: sood Summary of Call: pt refurned a call to someone from yesterday Initial call taken by: Oneita Jolly,  January 22, 2010 2:49 PM  Follow-up for Phone Call        Dr. Craige Cotta did you call the sleep ctr about this pt? They are calling back pls advise, thanks! Follow-up by: Vernie Murders,  January 22, 2010 2:54 PM  Additional Follow-up for Phone Call Additional follow up Details #1::        returned phone call. discussed sleep test results.  will start CPAP and arrange for ROV in 6 to 8 weeks. Additional Follow-up by: Coralyn Helling MD,  January 22, 2010 3:25 PM  New Problems: OBSTRUCTIVE SLEEP APNEA (ICD-327.23)   Additional Follow-up for Phone Call Additional follow up Details #2::    Spoke with pt and sched rov with VS for 03/18/10 at 2 pm. Follow-up by: Vernie Murders,  January 22, 2010 3:31 PM  New Problems: OBSTRUCTIVE SLEEP APNEA (ICD-327.23)

## 2010-07-22 NOTE — Letter (Signed)
Summary: TRIAGE CALL  TRIAGE CALL   Imported By: Arta Bruce 08/03/2009 14:40:10  _____________________________________________________________________  External Attachment:    Type:   Image     Comment:   External Document

## 2010-07-22 NOTE — Progress Notes (Signed)
Summary: Ortho Referral  Phone Note From Other Clinic   Caller: Referral Coordinator Summary of Call: See ortho order for Dec 2010.Pt had appt & was aware of appt. Initial call taken by: Candi Leash,  September 29, 2009 4:09 PM  Follow-up for Phone Call        Not sure why she did not go. Can we reschedule her? Follow-up by: Tereso Newcomer PA-C,  September 29, 2009 5:34 PM  Additional Follow-up for Phone Call Additional follow up Details #1::        I will call Pt to have her to R/S according to her availability Additional Follow-up by: Candi Leash,  October 01, 2009 9:27 AM    Additional Follow-up for Phone Call Additional follow up Details #2::    Thank you. Tereso Newcomer PA-C  October 01, 2009 1:21 PM   Additional Follow-up for Phone Call Additional follow up Details #3:: Details for Additional Follow-up Action Taken: Pt given number to R/S [phone] Additional Follow-up by: Candi Leash,  October 05, 2009 8:27 AM

## 2010-07-22 NOTE — Progress Notes (Signed)
Summary: REFILLS  Phone Note Call from Patient   Reason for Call: Refill Medication Summary of Call: [name] PT. [name] SAYS THAT SHE NEEDS A REFILL ON HER NABUMTONE AND AMEPRZOLE,  SHE SAYS THAT SHE DOESN'T KNOW HOW TO USE THE ADVAIR DISC AND SHE WANTS A PUMP CALLED IN.  SHE USES [facility]. Initial call taken by: Leodis Rains,  September 17, 2009 11:36 AM  Follow-up for Phone Call        forward to provider Follow-up by: [place] Shannon,  September 21, 2009 6:25 PM  Additional Follow-up for Phone Call Additional follow up Details #1::        Have her come in to be taught how to use Advair discus.  Additional Follow-up by: Tereso Newcomer PA-C,  September 22, 2009 1:32 PM    Additional Follow-up for Phone Call Additional follow up Details #2::    pt is aware of meds and will come to learn how to use disc  Prescriptions: OMEPRAZOLE 40 MG CPDR (OMEPRAZOLE) once daily for stomach  #30 x 5   Entered and Authorized by:   Tereso Newcomer PA-C   Signed by:   Tereso Newcomer PA-C on 09/22/2009   Method used:   Electronically to        [facility] (retail)       [address]       [place], [place]  [id number]       Ph: [phone]       Fax: [phone]   RxID:   [id number] NABUMETONE 500 MG  TABS (NABUMETONE) Take 1 tablet by mouth every 12 hours as needed for back pain  #60 x 2   Entered and Authorized by:   Tereso Newcomer PA-C   Signed by:   Tereso Newcomer PA-C on 09/22/2009   Method used:   Electronically to        [facility] (retail)       [address]       [place], [place]  [id number]       Ph: [phone]       Fax: [phone]   RxID:   [id number]

## 2010-07-22 NOTE — Letter (Signed)
Summary: *HSN Results Follow up  [facility]  [address] [place], [place] [address]   Phone: [phone]  Fax: [phone]      09/30/2009   [name] [address] [place], [place]  [address]   Dear  [name],                            ____S.Drinkard,FNP   ____D. Gore,FNP       ____B. McPherson,MD   ____V. Rankins,MD    ____E. Mulberry,MD    ____N. Daphine Deutscher, FNP  ____D. Reche Dixon, MD    ____K. [place] Deputy, MD    __x__S. Alben Spittle, PA-C     This letter is to inform you that your recent test(s):  _______Pap Smear    ___x____Lab Test     _______X-ray    ___x___ is within acceptable limits  _______ requires a medication change  _______ requires a follow-up lab visit  _______ requires a follow-up visit with your provider   Comments: Everything is normal on your blood work.       _________________________________________________________ If you have any questions, please contact our office                     Sincerely,  Tereso Newcomer PA-C [facility]

## 2010-07-22 NOTE — Medication Information (Signed)
Summary: PA FOR OMEPRAZOLE  PA FOR OMEPRAZOLE   Imported By: Arta Bruce 11/09/2009 10:25:57  _____________________________________________________________________  External Attachment:    Type:   Image     Comment:   External Document

## 2010-07-22 NOTE — Medication Information (Signed)
Summary: PAP Supplies/[facility]  P[facility] Supplies/[facility]   Imported By: Sherian Rein 02/12/2010 15:18:03  _____________________________________________________________________  External Attachment:    Type:   Image     Comment:   External Document

## 2010-07-22 NOTE — Letter (Signed)
Summary: triage call  triage call   Imported By: Arta Bruce 09/07/2009 12:14:51  _____________________________________________________________________  External Attachment:    Type:   Image     Comment:   External Document

## 2010-07-22 NOTE — Assessment & Plan Note (Signed)
Summary: URINE TEST//[facility]  Nurse Visit   Vitals Entered By: Arm[place] Shannon (July 06, 2009 4:11 PM)  Allergies: 1)  ! Pcn 2)  ! Bactrim Laboratory Results   Urine Tests  Date/Time Received: July 06, 2009 3:39 PM   Routine Urinalysis   Glucose: negative   (Normal Range: Negative) Bilirubin: negative   (Normal Range: Negative) Ketone: negative   (Normal Range: Negative) Spec. Gravity: <1.005   (Normal Range: 1.003-1.035) Blood: negative   (Normal Range: Negative) pH: 6.5   (Normal Range: 5.0-8.0) Protein: negative   (Normal Range: Negative) Urobilinogen: 0.2   (Normal Range: 0-1) Nitrite: negative   (Normal Range: Negative) Leukocyte Esterace: negative   (Normal Range: Negative)       Orders Added: 1)  Est. Patient Level I [[address]]

## 2010-07-22 NOTE — Letter (Signed)
Summary: GI NOTES  GI NOTES   Imported By: Arta Bruce 11/05/2009 15:00:24  _____________________________________________________________________  External Attachment:    Type:   Image     Comment:   External Document

## 2010-07-22 NOTE — Progress Notes (Signed)
  Phone Note Call from Patient   Summary of Call: spoke with pt and she says she still have fever... and think pneumonia is still present. pt comes in on May 11 @ [facility] Initial call taken by: Arm[place] Shannon,  Oct 26, 2009 4:25 PM  Follow-up for Phone Call        Have her get a CXR tomorrow. Order in basket. Find out how high her fever is. If she is coughing and having a fever of 101 or higher, she can go to urgent care. Otherwise, I will see her on Wednesday.  Follow-up by: Tereso Newcomer PA-C,  Oct 26, 2009 4:40 PM  Additional Follow-up for Phone Call Additional follow up Details #1::        Left message on answering machine for pt to call back.Marland KitchenMarland KitchenMarland KitchenArmenia Shannon  Oct 27, 2009 4:48 PM   pt comes in today Additional Follow-up by: [place] Shannon,  Oct 28, 2009 8:52 AM  New Problems: PNEUMONIA (ICD-486)   New Problems: PNEUMONIA (ICD-486)

## 2010-07-22 NOTE — Letter (Signed)
Summary: NOTES FROM DR.FAERA BYERLY  NOTES FROM DR.FAERA BYERLY   Imported By: Arta Bruce 03/08/2010 15:06:44  _____________________________________________________________________  External Attachment:    Type:   Image     Comment:   External Document

## 2010-07-22 NOTE — Letter (Signed)
Summary: TRIAGE CALL  TRIAGE CALL   Imported By: Arta Bruce 10/02/2009 12:25:34  _____________________________________________________________________  External Attachment:    Type:   Image     Comment:   External Document

## 2010-07-22 NOTE — Progress Notes (Signed)
Summary: Persistent UTI symptoms  Phone Note Call from Patient   Summary of Call: patient wants to know if she can get medicine she is having uti, she still having the same symptoms please call her at [phone] or [phone] Initial call taken by: Domenic Polite,  June 28, 2010 8:20 AM  Follow-up for Phone Call        Micah Flesher to hospital a week ago and treated for UTI, completed the antibiotics, still having burning and pain when she urinates.  Denies fever, abdominal pain, hematuria.  States some diarrhea during antibiotics which has now stopped.  Urine has odor and is clear, green in color.  Has some vaginal discharge, also green in color.  Hospital records including culture results in your refill slot.   Was treated with Macrobid and pyridium.   Wants refill of antibiotic -- advised to drink plenty of water,  instructed as to appropriate perineal hygiene and to avoid scented products, use cotton-lined undergarments.  Also advised that she may need to be seen for f/u UA and that office is closed currently - verbalized understanding.  Dutch Quint RN  June 28, 2010 12:26 PM  Saw Call-A-Nurse report and note from provider.  Called pt. back and was told that she is getting weak in the legs when she's walking, similar to symptoms she had before.  Reminded that office is closed and that if she can't wait until Friday for f/u UA, to go to ED or UC for more immediate follow-up.  Will call back for appt. on Friday if she doesn't f/u with emergent care. Follow-up by: Dutch Quint RN,  June 28, 2010 4:36 PM

## 2010-07-22 NOTE — Assessment & Plan Note (Signed)
Summary: FU ON BP////KT   Vital Signs:  Patient profile:   60 year old female Weight:      336.50 pounds BMI:     63.81 Temp:     97.8 degrees F Pulse rate:   80 / minute Pulse rhythm:   regular Resp:     12 per minute BP sitting:   95 / 61  (left arm) Cuff size:   large  Vitals Entered By: Chauncy Passy, SMA CC: Pt. is here for a  3 month f/u on her BP. Pt is complaining of lower back pain. , Hypertension Management Is Patient Diabetic? No Pain Assessment Patient in pain? yes     Location: lower back Intensity: 10 Type: aching Onset of pain  Constant  Does patient need assistance? Functional Status Self care Ambulation Normal   Primary Care Provider:  Tereso Newcomer PA-C  CC:  Pt. is here for a  3 month f/u on her BP. Pt is complaining of lower back pain.  and Hypertension Management.  History of Present Illness: Here for f/u.  She says, "I'm sick."  Rec'd phone call about her needed referral back to GI for her cancer.  Notes rec'd.  She does not have cancer.  This is a recurring theme for her. Went to the hosp in Jan for chest pain.  R/o.  Discharged.  She is followed closely with Dr. Sharyn Lull.  Notes back pain.  Low back.  Present x 2 days.  No noted injury.  No radiation.  No dysuria or hematuria.  Says, "My kidneys hurt."  No radicular symptoms.  No loss of bowel or bladder function.  Knee arthritis:  Has not heard anything regarding referral for knee DJD.  Hypertension History:      She denies chest pain, dyspnea with exertion, and syncope.  She notes no problems with any antihypertensive medication side effects.        Positive major cardiovascular risk factors include female age 60 years old or older and hypertension.  Negative major cardiovascular risk factors include negative family history for ischemic heart disease and non-tobacco-user status.     Current Medications (verified): 1)  Synthroid 100 Mcg  Tabs (Levothyroxine Sodium) .... Take 1 Tablet By Mouth  Once A Day (Dr.harwani) 2)  Folic Acid 1 Mg  Tabs (Folic Acid) .Marland Kitchen.. 1 By Mouth Qday 3)  Lopressor 50 Mg  Tabs (Metoprolol Tartrate) .Marland Kitchen.. 1 By Mouth Bid 4)  Proventil Hfa 108 (90 Base) Mcg/act  Aers (Albuterol Sulfate) .... 2 Puffs Q4-6hrs Prn 5)  Depakote Er 500 Mg  Tb24 (Divalproex Sodium) .... 2 By Mouth At Bedtime (Per [facility]) 6)  Benztropine Mesylate 0.5 Mg  Tabs (Benztropine Mesylate) .Marland Kitchen.. 1 By Mouth Two Times A Day 7)  [name] 6 Mg  Tb24 (Paliperidone) .Marland Kitchen.. 1 By Mouth in Morning(Per [facility]) 8)  Nitroquick 0.4 Mg  Subl (Nitroglycerin) .Marland Kitchen.. 1sl Q18min As Needed(Dr.harwani) 9)  Nabumetone 500 Mg  Tabs (Nabumetone) .... Take 1 Tablet By Mouth Every 12 Hours As Needed For Back Pain 10)  Omeprazole 40 Mg Cpdr (Omeprazole) .... Once Daily For Stomach 11)  Seroquel 300 Mg Tabs (Quetiapine Fumarate) .... Take 2 Tablets By Mouth At Bedtime(Per [facility]) 12)  Lisinopril 40 Mg Tabs (Lisinopril) .... Take 1 Tablet By Mouth Every 12 Hours(Dr.harwani) 13)  Fluticasone Propionate 50 Mcg/act Susp (Fluticasone Propionate) .... 2 Sp Each Nostril Q Am 14)  Advair Diskus 100-50 Mcg/dose Misc (Fluticasone-Salmeterol) .Marland Kitchen.. 1 Puff 2 Times Daily 15)  Trazodone  Hcl 100 Mg Tabs (Trazodone Hcl) .... Two Tabs At Bedtime 16)  Amlodipine Besylate 5 Mg Tabs (Amlodipine Besylate) .... One Tab Each Day  Allergies (verified): 1)  ! Pcn 2)  ! Bactrim  Physical Exam  General:  alert, well-developed, and well-nourished.   Head:  normocephalic and atraumatic.   Neck:  supple.   Lungs:  normal breath sounds.   Heart:  normal rate and regular rhythm.   Abdomen:  soft and non-tender.   Msk:  right lower lumbar paraspinal muscles tend to palp no spinal tend neg SLR bilat neg CVA tend bilat  Neurologic:  alert & oriented X3 and cranial nerves II-XII intact.   Psych:  Oriented X3 and normally interactive.     Impression & Recommendations:  Problem # 1:  PREVENTIVE HEALTH CARE (ICD-V70.0) s/p TAH had pap in 2007 would rec  doing again in 2012 vaccines up to date has had mammo arrange CPE for fasting Lipids, breast exam, etc.  Problem # 2:  LOW BACK PAIN SYNDROME (ICD-724.2)  suspect muscle spasm continue nabumetone as needed Rx for robaxin as needed check u/a  Her updated medication list for this problem includes:    Nabumetone 500 Mg Tabs (Nabumetone) .Marland Kitchen... Take 1 tablet by mouth every 12 hours as needed for back pain    Robaxin 500 Mg Tabs (Methocarbamol) .Marland Kitchen... 1 by mouth every 8 hours as needed muscle spasm  Orders: T-Comprehensive Metabolic Panel (04540-98119) UA Dipstick w/o Micro (manual) (14782)  Problem # 3:  HYPERTENSION, BENIGN ESSENTIAL (ICD-401.1)  stable check labs  Her updated medication list for this problem includes:    Lopressor 50 Mg Tabs (Metoprolol tartrate) .Marland Kitchen... 1 by mouth bid    Lisinopril 40 Mg Tabs (Lisinopril) .Marland Kitchen... Take 1 tablet by mouth every 12 hours(dr.harwani)    Amlodipine Besylate 5 Mg Tabs (Amlodipine besylate) ..... 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 . . which does not exist she f/u with [facility] again explained to her today that she does not have any know CA  Problem # 5:  DEGENERATIVE JOINT DISEASE, KNEES, BILATERAL (ICD-715.96) will check on referral  Her updated medication list for this problem includes:    Nabumetone 500 Mg Tabs (Nabumetone) .Marland Kitchen... Take 1 tablet by mouth every 12 hours as needed for back pain    Robaxin 500 Mg Tabs (Methocarbamol) .Marland Kitchen... 1 by mouth every 8 hours as needed muscle spasm  Complete Medication List: 1)  Synthroid 100 Mcg Tabs (Levothyroxine sodium) .... Take 1 tablet by mouth once a day (dr.harwani) 2)  Folic Acid 1 Mg Tabs (Folic acid) .Marland Kitchen.. 1 by mouth qday 3)  Lopressor 50 Mg Tabs (Metoprolol tartrate) .Marland Kitchen.. 1 by mouth bid 4)  Proventil Hfa 108 (90 Base) Mcg/act Aers (Albuterol sulfate)  .... 2 puffs q4-6hrs prn 5)  Depakote Er 500 Mg Tb24 (Divalproex sodium) .... 2 by mouth at bedtime (per [facility]) 6)  Benztropine Mesylate 0.5 Mg Tabs (Benztropine mesylate) .Marland Kitchen.. 1 by mouth two times a day 7)  [name] 6 Mg Tb24 (Paliperidone) .Marland Kitchen.. 1 by mouth in morning(per [facility]) 8)  Nitroquick 0.4 Mg Subl (Nitroglycerin) .Marland Kitchen.. 1sl q58min as needed(dr.harwani) 9)  Nabumetone 500 Mg Tabs (Nabumetone) .... Take 1 tablet by mouth every 12 hours as needed for back pain 10)  Omeprazole 40 Mg Cpdr (Omeprazole) .... Once daily for stomach 11)  Seroquel 300 Mg Tabs (  Quetiapine fumarate) .... Take 2 tablets by mouth at bedtime(per [facility]) 12)  Lisinopril 40 Mg Tabs (Lisinopril) .... Take 1 tablet by mouth every 12 hours(dr.harwani) 13)  Fluticasone Propionate 50 Mcg/act Susp (Fluticasone propionate) .... 2 sp each nostril q am 14)  Advair Diskus 100-50 Mcg/dose Misc (Fluticasone-salmeterol) .Marland Kitchen.. 1 puff 2 times daily 15)  Trazodone Hcl 100 Mg Tabs (Trazodone hcl) .... Two tabs at bedtime 16)  Amlodipine Besylate 5 Mg Tabs (Amlodipine besylate) .... One tab each day 17)  Robaxin 500 Mg Tabs (Methocarbamol) .Marland Kitchen.. 1 by mouth every 8 hours as needed muscle spasm  Hypertension Assessment/Plan:      The patient's hypertensive risk group is category B: At least one risk factor (excluding diabetes) with no target organ damage.  Today's blood pressure is 95/61.  Her blood pressure goal is < 140/90.  Patient Instructions: 1)  Please schedule a follow-up appointment in 4 months with Sharmaine Bain for CPE. 2)  Someone will call you about referral to orthopedics. Prescriptions: ROBAXIN 500 MG TABS (METHOCARBAMOL) 1 by mouth every 8 hours as needed muscle spasm  #30 x 0   Entered and Authorized by:   Tereso Newcomer PA-C   Signed by:   Tereso Newcomer PA-C on 09/28/2009   Method used:   Print then Give to Patient   RxID:   [id number]   Laboratory Results   Urine Tests  Date/Time Received: September 28, 2009 3:11 PM   Routine  Urinalysis   Glucose: negative   (Normal Range: Negative) Bilirubin: negative   (Normal Range: Negative) Ketone: negative   (Normal Range: Negative) Spec. Gravity: 1.010   (Normal Range: 1.003-1.035) Blood: negative   (Normal Range: Negative) pH: 8.0   (Normal Range: 5.0-8.0) Protein: negative   (Normal Range: Negative) Urobilinogen: 0.2   (Normal Range: 0-1) Nitrite: negative   (Normal Range: Negative) Leukocyte Esterace: negative   (Normal Range: Negative)

## 2010-07-22 NOTE — Miscellaneous (Signed)
Summary: PA approved for omeprazole x1 year  11/06/09-11/07/10  Clinical Lists Changes  PA approved for omeprazole, including override, x1 year 11/06/09-11/07/10.  Dutch Quint RN  March 08, 2010 11:56 AM    Medications: Changed medication from OMEPRAZOLE 20 MG CPDR (OMEPRAZOLE) Take 1 capsule by mouth two times a day (pharmacy note change in dose and sig) to OMEPRAZOLE 20 MG CPDR (OMEPRAZOLE) Take 1 capsule by mouth two times a day (pharmacy note change in dose and sig)

## 2010-07-22 NOTE — Progress Notes (Signed)
Summary: BLEEDING FROM MOUTH AND VAGINA  Phone Note Call from Patient Call back at Home Phone [phone]   Reason for Call: Talk to Nurse Summary of Call: [name] CALLED BECAUSE SHE CALLED CALL-A-NURSE EARLY THIS MORNING BECAUSE SHE IS EXPERIENCING BLEEDING FROM  HER VAGINA AND ALSO SHE SAYS ITS COMING FROM HER MOUTH.  Initial call taken by: Leodis Rains,  April 21, 2010 11:50 AM  Follow-up for Phone Call        [name] CALLED, AND I RELAYED TO HER THAT I SPOKE WITH THE NURSE BRIEFLY, AND PER TERESA, SHE NEEDS TO GO TO THE HOSPITAL.  [name] SAYS THAT SHE IS GOINIG TO THE HOSPITAL SOMETIME TODAY, AFTER  [facility] THIS AFTERNOON. Follow-up by: Leodis Rains,  April 21, 2010 12:37 PM  Additional Follow-up for Phone Call Additional follow up Details #1::        Left message on answer machine for pt. to return call. Last ER record in E-Chart 10/10/11Melinda Madtes RN  April 22, 2010 9:41 AM    Pt. returned call did not go to ER. States when she spits she has sm amt. of blood. Recently had dental extractions going to dental clinic today to be fitted for dentures. When she wipes sm amt of blood on tissues, does not need to wear pad. Denies constipation. Advised to keep scheduled appt. here 05/12/10. She stated she had called the hospital and they told her that she has stomach and lung cancer.  it was in her chart. No notation of that in our record.Advised to call us if bleeding becomes heavy or starts to feel lightheaded or dizzy.  Gaylyn Cheers RN  April 22, 2010 10:03 AM

## 2010-07-22 NOTE — Letter (Signed)
Summary: CALL A NURSE  CALL A NURSE   Imported By: Arta Bruce 04/23/2010 14:18:17  _____________________________________________________________________  External Attachment:    Type:   Image     Comment:   External Document

## 2010-07-22 NOTE — Progress Notes (Signed)
Summary: med refills  Phone Note Call from Patient   Caller: Patient Reason for Call: Refill Medication Summary of Call: pt needs refills on her meds, [facility], [phone] she said they faxed it over here but still not here  Initial call taken by: Oscar La,  February 26, 2010 8:15 AM  Follow-up for Phone Call        forward to Miachel Nardelli Follow-up by: [place] Shannon,  February 26, 2010 8:18 AM  Additional Follow-up for Phone Call Additional follow up Details #1::        completed form for omeprazole is this all she needed? Additional Follow-up by: Tereso Newcomer PA-C,  March 01, 2010 9:58 AM    Additional Follow-up for Phone Call Additional follow up Details #2::    pt has enough refills Follow-up by: [place] Shannon,  March 01, 2010 4:00 PM

## 2010-07-22 NOTE — Letter (Signed)
Summary: TRIAGE CALL  TRIAGE CALL   Imported By: Arta Bruce 11/05/2009 14:23:53  _____________________________________________________________________  External Attachment:    Type:   Image     Comment:   External Document

## 2010-07-22 NOTE — Miscellaneous (Signed)
Summary: GI notes   Seen by Dr. Evette Cristal for Constipation on 04/22/2009. No mention of cancer in notes. Patient had called [place] recently stating her GI doctor was seeing her for cancer.  Clinical Lists Changes  Observations: Added new observation of PLATELETK/UL: 194 K/uL (04/23/2009 16:55) Added new observation of MCV: 101.5 fL (04/23/2009 16:55) Added new observation of HCT: 37.3 % (04/23/2009 16:55) Added new observation of HGB: 12.9 g/dL (16/03/9603 54:09) Added new observation of WBC COUNT: 5.0 10*3/microliter (04/23/2009 16:55)

## 2010-07-22 NOTE — Letter (Signed)
Summary: TRIAGE CALL  TRIAGE CALL   Imported By: Arta Bruce 11/02/2009 10:29:00  _____________________________________________________________________  External Attachment:    Type:   Image     Comment:   External Document

## 2010-07-22 NOTE — Progress Notes (Signed)
Summary: Refill omeprazole and folic acid?  Phone Note Outgoing Call   Summary of Call: Refill omeprazole and folic acid?  Last seen 11/2009, next appt. 06/08/10. Initial call taken by: Dutch Quint RN,  May 28, 2010 1:52 PM  Follow-up for Phone Call        ok to refill Follow-up by: Lehman Prom FNP,  May 28, 2010 2:18 PM  Additional Follow-up for Phone Call Additional follow up Details #1::        Omeprazole refilled.  Spoke with pharmacy, Folic Acid was filled for 90-days -- advised too soon for refills. They will contact pt. Additional Follow-up by: Dutch Quint RN,  May 28, 2010 2:45 PM    Prescriptions: OMEPRAZOLE 20 MG CPDR (OMEPRAZOLE) Take 1 capsule by mouth two times a day (pharmacy note change in dose and sig)  #60 x 3   Entered by:   Dutch Quint RN   Authorized by:   Lehman Prom FNP   Signed by:   Dutch Quint RN on 05/28/2010   Method used:   Electronically to        [facility]* (retail)       [address]       [place], [place]  [address]       Ph: [phone]       Fax: [phone]   RxID:   [id number]

## 2010-07-22 NOTE — Medication Information (Signed)
Summary: SILVERSCRIPT  SILVERSCRIPT   Imported By: Arta Bruce 12/25/2009 15:18:44  _____________________________________________________________________  External Attachment:    Type:   Image     Comment:   External Document

## 2010-07-22 NOTE — Letter (Signed)
Summary: CALL A NURSE  CALL A NURSE   Imported By: Arta Bruce 06/15/2010 16:32:07  _____________________________________________________________________  External Attachment:    Type:   Image     Comment:   External Document

## 2010-07-22 NOTE — Letter (Signed)
Summary: triage call report  triage call report   Imported By: Arta Bruce 07/02/2009 14:16:41  _____________________________________________________________________  External Attachment:    Type:   Image     Comment:   External Document

## 2010-07-22 NOTE — Medication Information (Signed)
Summary: RX Folder//SILVER/SCRIPT/OMEPRAZOLE/DENIAL  RX Folder//SILVER/SCRIPT/OMEPRAZOLE/DENIAL   Imported By: Arta Bruce 12/28/2009 09:45:53  _____________________________________________________________________  External Attachment:    Type:   Image     Comment:   External Document

## 2010-07-22 NOTE — Progress Notes (Signed)
  Phone Note Refill Request   Refills Requested: Medication #1:  FLUTICASONE PROPIONATE 50 MCG/ACT SUSP 2 sp each nostril q am  Medication #2:  ADVAIR DISKUS 100-50 MCG/DOSE MISC 1 puff 2 times daily Initial call taken by: [place] Shannon,  December 16, 2009 3:05 PM Caller: [facility] Reason for Call: Needs renewal Summary of Call: Rec'd request for synthroid. Records indicate she gets this from Dr. Sharyn Lull. Please confirm.  If Dr. Sharyn Lull refills this medicine, please send request to him.  Initial call taken by: Brynda Rim,  December 15, 2009 9:47 PM  Follow-up for Phone Call        spoke with pt and she requested refill on the advair and flucticasone... called dr. Sharyn Lull office and they will prescribe her synthroid Follow-up by: [place] Shannon,  December 16, 2009 3:27 PM  Additional Follow-up for Phone Call Additional follow up Details #1::        pt is aware Additional Follow-up by: [place] Shannon,  December 18, 2009 9:31 AM    Prescriptions: ADVAIR DISKUS 100-50 MCG/DOSE MISC (FLUTICASONE-SALMETEROL) 1 puff 2 times daily  #1 x 5   Entered and Authorized by:   Tereso Newcomer PA-C   Signed by:   Tereso Newcomer PA-C on 12/16/2009   Method used:   Electronically to        [facility]* (retail)       [address]       [place], [place]  [address]       Ph: [phone]       Fax: [phone]   RxID:   [id number] FLUTICASONE PROPIONATE 50 MCG/ACT SUSP (FLUTICASONE PROPIONATE) 2 sp each nostril q am  #1 x 6   Entered and Authorized by:   Tereso Newcomer PA-C   Signed by:   Tereso Newcomer PA-C on 12/16/2009   Method used:   Electronically to        [facility] (retail)       [address]       [place], [place]  [address]       Ph: [phone]       Fax: [phone]   RxID:   [id number]

## 2010-07-22 NOTE — Progress Notes (Signed)
Summary: Meds refill  Phone Note Call from Patient Call back at Home Phone [phone]   Summary of Call: Ms. Ali states that [facility] sent a refill request for her lisinopril medication and the pharmacy still waiting for the authorization. Alben Spittle Pa-c Initial call taken by: Manon Hilding,  Nov 04, 2009 12:25 PM  Follow-up for Phone Call        spoke with pt and let her know that we did prior auth.. called pharmacy and its not going through... [facility].   Ms. Garn is still waiting for a response back in reference her request so please call her back.  Pt needs refills authorization from lisinopril.Manon Hilding  Nov 05, 2009 3:48 PM  Additional Follow-up for Phone Call Additional follow up Details #1::        Spoke with [facility] - unable to get Omeprazole to go through, but had Lisinopril filled on 10/19/09 per pharmacy.  Is now out of refills for Lisinopril and Metoprolol. Dr. Sharyn Lull wrote Rx for last Lisinopril -- we have no info on Metoprolol.  Please advise. Dutch Quint RN  Nov 06, 2009 10:46 AM     Additional Follow-up for Phone Call Additional follow up Details #2::    If Dr. Sharyn Lull prescribed Lisinopril, have refill request sent to him. Find out if Rx for Lopressor (Metoprolol) came from Dr. Sharyn Lull as well. . . .we have never refilled it according to EMR.  If from him, have Dr. Sharyn Lull refill.  Will submit new Rx for omeprazole at different dose to see if it will go through. Follow-up by: Tereso Newcomer PA-C,  Nov 06, 2009 1:43 PM  Additional Follow-up for Phone Call Additional follow up Details #3:: Details for Additional Follow-up Action Taken: Spoke with [facility] -- Dr. Sharyn Lull prescribed both BP meds -- will be routed to him for refills. Dutch Quint RN  Nov 06, 2009 2:34 PM  Additional Follow-up by: Dutch Quint RN,  Nov 06, 2009 2:34 PM  New/Updated Medications: OMEPRAZOLE 20 MG CPDR (OMEPRAZOLE) Take 1 capsule by mouth two times a  day (pharmacy note change in dose and sig) Prescriptions: OMEPRAZOLE 20 MG CPDR (OMEPRAZOLE) Take 1 capsule by mouth two times a day (pharmacy note change in dose and sig)  #60 x 5   Entered and Authorized by:   Tereso Newcomer PA-C   Signed by:   Tereso Newcomer PA-C on 11/06/2009   Method used:   Electronically to        [facility] (retail)       [address]       [place], [place]  [address]       Ph: [phone]       Fax: [phone]   RxID:   [id number]

## 2010-07-22 NOTE — Miscellaneous (Signed)
Summary: Admission to [facility] 06/2009 reviewed  Reviewed hospital records. Recent admxn to [facility] for chest pain.  Patient r/o for MI and was d/c to home.  No further w/u was pursued.  Clinical Lists Changes  Observations: Added new observation of PAST MED HX: Current Problems:  CHEST PAIN, HX OF (ICD-V12.50)...sees Dr.Harwani(cards)   a. 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

## 2010-07-22 NOTE — Letter (Signed)
Summary: CALL A NURSE  CALL A NURSE   Imported By: Arta Bruce 04/09/2010 12:35:59  _____________________________________________________________________  External Attachment:    Type:   Image     Comment:   External Document

## 2010-07-22 NOTE — Progress Notes (Signed)
  Phone Note Call from Patient Call back at Home Phone [phone] Call back at [phone]   Summary of Call: Last week pt went to the ER and they diagnosed her with pneumonia and she started using two different types of antibiotics, which are: (oxycodone,and pt do not remember the other one)..  Pt still has some fever but she cannot recheck due because she does not has a termometer. Alben Spittle Pa-c   Initial call taken by: Manon Hilding,  October 13, 2009 4:34 PM  Follow-up for Phone Call        spoke with pt and she says she went to ER last week and they diagnosed her with pneumonia... pt says she still feel feverish... pt is continuing taking med that ER gave her... pt says she has nausea but no vomiting... pt says she hardly have an appeitate.. pt says she has no way to come in but would like something prescribed... [facility].  Additional Follow-up for Phone Call Additional follow up Details #1::        They gave her Zithromax which is taken for 5 days.  But, it works for 10 days.   She will need an appoinment for f/u. Additional Follow-up by: Tereso Newcomer PA-C,  October 14, 2009 11:47 AM    Additional Follow-up for Phone Call Additional follow up Details #2::    graceila can you make pt a appt to f/u with scott please Follow-up by: Armenia Shannon,  October 14, 2009 12:42 PM  Additional Follow-up for Phone Call Additional follow up Details #3:: Details for Additional Follow-up Action Taken: the pt will come on May 11,2011 at 3 pm..Marland KitchenManon Hilding  October 14, 2009 4:56 PM

## 2010-07-22 NOTE — Assessment & Plan Note (Signed)
Summary: SEEN IN ER ON 12/25/LOW BLOOD///KT   Vital Signs:  Patient profile:   60 year old female Menstrual status:  Partial Hysterectomy Weight:      342.25 pounds BMI:     64.90 Temp:     96.7 degrees F oral Pulse rate:   80 / minute Pulse rhythm:   regular Resp:     18 per minute BP sitting:   112 / 82  (left arm) Cuff size:   large  Vitals Entered By: Hale Drone CMA (July 13, 2010 2:41 PM) CC: F/u from ER. Having chest pain. Still having some chest discomfort.  Is Patient Diabetic? No Pain Assessment Patient in pain? yes     Location: chest Intensity: 8 Type: stabbing Onset of pain  Intermittent  Does patient need assistance? Functional Status Self care Ambulation Normal     Menstrual Status Partial Hysterectomy   Primary Care Provider:  Tereso Newcomer PA-C  CC:  F/u from ER. Having chest pain. Still having some chest discomfort. Marland Kitchen  History of Present Illness: 60 yo female who has been to ED multiple times since Christmas with chest pain.  Pt. has hx of normal coronaries arteries by cath in 2007.  Echo in recent past has not showed regional wall motion abnormalities.  NM study on 12/29/09 showed no reversible ischemia or infarction.Has had rule out of MI each time in ED and not felt to be secondary to cardiac disease.  Pt. states she has been to Dr. Sharyn Lull, her cardiologist in past 2 months.  Not clear what she was told at his office regarding the chest pain.  Not clear she was actually seen recently as at first she states he sees her every 6 weeks.    Pt. is a very poor historian--sounds like chest pain became more of an issue about 5 months ago.  Describes the pain "like somebody with boots on standing on my chest."  Usually starts when pt. lying flat on her back.  States she gets weak in the legs and she gets sweaty.  Pt. initially states the pain does not go anywhere, but later when asked if radiates to her back , she states it does sometimes and also to her left  jaw, left arm and bilateral legs.  Also, gets dyspneic. States she has just started walking, but does not have the chest pain with that--only with lying down or if gets upset.  Does note some acid burning into her throat, but states not that often.  Does take Omeprazole two times a day.    Pt. later states if she rolls over in bed, the chest pain is worse.  When taking a deep breath, it is worse.  She answers that she does have calf pain, but then asks "what are your calfs?" Pt. states she has not eaten today--"not hungry".  Up 14 lbs from October.  Pt. goes to Dr. Drucilla Schmidt at the [facility] would be okay with me speaking with Dr. Manson Passey.      Current Medications (verified): 1)  Synthroid 100 Mcg  Tabs (Levothyroxine Sodium) .... Take 1 Tablet By Mouth Once A Day (Dr.harwani) 2)  Folic Acid 1 Mg  Tabs (Folic Acid) .Marland Kitchen.. 1 By Mouth Qday 3)  Lopressor 50 Mg  Tabs (Metoprolol Tartrate) .Marland Kitchen.. 1 By Mouth Bid 4)  Depakote Er 500 Mg  Tb24 (Divalproex Sodium) .... 2 By Mouth At Bedtime (Per [facility]) 5)  Benztropine Mesylate 0.5 Mg  Tabs (Benztropine Mesylate) .Marland KitchenMarland KitchenMarland Kitchen 1  By Mouth Two Times A Day 6)  [name] 6 Mg  Tb24 (Paliperidone) .Marland Kitchen.. 1 By Mouth in Morning(Per [facility]) 7)  Nitroquick 0.4 Mg  Subl (Nitroglycerin) .Marland Kitchen.. 1sl Q78min As Needed(Dr.harwani) 8)  Nabumetone 500 Mg  Tabs (Nabumetone) .... Take 1 Tablet By Mouth Every 12 Hours As Needed For Back Pain 9)  Omeprazole 20 Mg Cpdr (Omeprazole) .... Take 1 Capsule By Mouth Two Times A Day (Pharmacy Note Change in Dose and Sig) 10)  Seroquel 300 Mg Tabs (Quetiapine Fumarate) .... Take 2 Tablets By Mouth At Bedtime(Per [facility]) 11)  Lisinopril 40 Mg Tabs (Lisinopril) .... Take 1 Tablet By Mouth Every 12 Hours(Dr.harwani) 12)  Fluticasone Propionate 50 Mcg/act Susp (Fluticasone Propionate) .... 2 Sp Each Nostril Q Am 13)  Advair Diskus 100-50 Mcg/dose Misc (Fluticasone-Salmeterol) .Marland Kitchen.. 1 Puff 2 Times Daily 14)  Trazodone Hcl 100 Mg Tabs (Trazodone Hcl) ....  Two Tabs At Bedtime 15)  Amlodipine Besylate 5 Mg Tabs (Amlodipine Besylate) .... One Tab Each Day 16)  Oxycodone-Acetaminophen 5-325 Mg Tabs (Oxycodone-Acetaminophen) .Marland Kitchen.. 1 By Mouth Every 6 Hours As Needed 17)  Zolpidem Tartrate 5 Mg Tabs (Zolpidem Tartrate) .Marland Kitchen.. 1 By Mouth At Bedtime As Needed 18)  Loratadine 10 Mg Tabs (Loratadine) .... Take 1 Tablet By Mouth Once A Day As Needed For Allergies  Allergies (verified): 1)  ! Pcn 2)  ! Bactrim  Social History: Patient never smoked.  Lives with [name], another pt. here at [facility] Disabled  Physical Exam  General:  Morbidly obese, NAD Chest Wall:  Quite tender over Left chest and parasternal area--reproduces same exact pain per pt. Lungs:  Normal respiratory effort, chest expands symmetrically. Lungs are clear to auscultation, no crackles or wheezes. Heart:  Normal rate and regular rhythm. S1 and S2 normal without gallop, murmur, click, rub or other extra sounds.  Radial pulses normal and equal Extremities:  No erythema.  Quite obese   Impression & Recommendations:  Problem # 1:  CHEST PAIN (ICD-786.50) Appears to be chest wall pain Do not want to put her on NSAIDS with GERD. Refer to PT  Orders: T-Lipid Profile ([id number]) Physical Therapy Referral (PT)  Problem # 2:  OBESITY (ICD-278.00)  Referral to Susie Piper  Orders: T-Lipid Profile ([id number])  Problem # 3:  HYPOTHYROIDISM NOS (ICD-244.9) With increased weight--check TSH--has not been checked in over a year Her updated medication list for this problem includes:    Synthroid 100 Mcg Tabs (Levothyroxine sodium) .Marland Kitchen... Take 1 tablet by mouth once a day (dr.harwani)  Orders: T-TSH ([id number])  Complete Medication List: 1)  Synthroid 100 Mcg Tabs (Levothyroxine sodium) .... Take 1 tablet by mouth once a day (dr.harwani) 2)  Folic Acid 1 Mg Tabs (Folic acid) .Marland Kitchen.. 1 by mouth qday 3)  Lopressor 50 Mg Tabs (Metoprolol tartrate) .Marland Kitchen.. 1 by mouth bid 4)   Depakote Er 500 Mg Tb24 (Divalproex sodium) .... 2 by mouth at bedtime (per [facility]) 5)  Benztropine Mesylate 0.5 Mg Tabs (Benztropine mesylate) .Marland Kitchen.. 1 by mouth two times a day 6)  [name] 6 Mg Tb24 (Paliperidone) .Marland Kitchen.. 1 by mouth in morning(per [facility]) 7)  Nitroquick 0.4 Mg Subl (Nitroglycerin) .Marland Kitchen.. 1sl q38min as needed(dr.harwani) 8)  Nabumetone 500 Mg Tabs (Nabumetone) .... Take 1 tablet by mouth every 12 hours as needed for back pain 9)  Omeprazole 20 Mg Cpdr (Omeprazole) .... Take 1 capsule by mouth two times a day (pharmacy note change in dose and sig) 10)  Seroquel 300 Mg Tabs (Quetiapine fumarate) .... Take  2 tablets by mouth at bedtime(per [facility]) 11)  Lisinopril 40 Mg Tabs (Lisinopril) .... Take 1 tablet by mouth every 12 hours(dr.harwani) 12)  Fluticasone Propionate 50 Mcg/act Susp (Fluticasone propionate) .... 2 sp each nostril q am 13)  Advair Diskus 100-50 Mcg/dose Misc (Fluticasone-salmeterol) .Marland Kitchen.. 1 puff 2 times daily 14)  Trazodone Hcl 100 Mg Tabs (Trazodone hcl) .... Two tabs at bedtime 15)  Amlodipine Besylate 5 Mg Tabs (Amlodipine besylate) .... One tab each day 16)  Oxycodone-acetaminophen 5-325 Mg Tabs (Oxycodone-acetaminophen) .Marland Kitchen.. 1 by mouth every 6 hours as needed 17)  Zolpidem Tartrate 5 Mg Tabs (Zolpidem tartrate) .Marland Kitchen.. 1 by mouth at bedtime as needed 18)  Loratadine 10 Mg Tabs (Loratadine) .... Take 1 tablet by mouth once a day as needed for allergies  Patient Instructions: 1)  Call if you do not hear from physical therapy in next 2 weeks. 2)  Follow up with Dr. Delrae Alfred in 4 months --chest wall pain 3)  Referral to [facility] for help with weight.   Orders Added: 1)  T-Lipid Profile [[id number]] 2)  T-TSH [[id number]] 3)  Est. Patient Level III [08657] 4)  Physical Therapy Referral [PT]       Appended Document: SEEN IN ER ON 12/25/LOW BLOOD///KT Realized needs evaluation for anemia--last hgb here was okay Send home with guaiac cards and check labs  below   Clinical Lists Changes  Problems: Added new problem of ANEMIA (ICD-285.9) Orders: Added new Test order of T-Iron [phone]) - Signed Added new Test order of T-Iron Binding Capacity (TIBC) ([id number]) - Signed Added new Test order of T-Vitamin B12 ([id number]) - Signed Added new Test order of T-Folic Acid; RBC ([id number]) - Signed

## 2010-07-22 NOTE — Assessment & Plan Note (Signed)
Summary: followup//lmr   Visit Type:  Follow-up Copy to:  Dr. Berkley Harvey, [place] Primary Provider/Referring Provider:  Tereso Newcomer PA-C  CC:  Hypersomnia...asthma...patient is here to discuss sleep results.  History of Present Illness: 60 yo female with mild sleep apnea, and insomnia.  She had her sleep study on July 25:  AHI 14.4, oxygen nadir 74%.  PLMI 11.7.  CPAP 10 cm h2o with AHI down to 4.5.  Observed in REM and supine sleep.  She has not received her CPAP machine yet even though order was placed previously on August 5.  As a result she continues to have sleep problems.  Current Medications (verified): 1)  Synthroid 100 Mcg  Tabs (Levothyroxine Sodium) .... Take 1 Tablet By Mouth Once A Day (Dr.harwani) 2)  Folic Acid 1 Mg  Tabs (Folic Acid) .Marland Kitchen.. 1 By Mouth Qday 3)  Lopressor 50 Mg  Tabs (Metoprolol Tartrate) .Marland Kitchen.. 1 By Mouth Bid 4)  Depakote Er 500 Mg  Tb24 (Divalproex Sodium) .... 2 By Mouth At Bedtime (Per [facility]) 5)  Benztropine Mesylate 0.5 Mg  Tabs (Benztropine Mesylate) .Marland Kitchen.. 1 By Mouth Two Times A Day 6)  [name] 6 Mg  Tb24 (Paliperidone) .Marland Kitchen.. 1 By Mouth in Morning(Per [facility]) 7)  Nitroquick 0.4 Mg  Subl (Nitroglycerin) .Marland Kitchen.. 1sl Q29min As Needed(Dr.harwani) 8)  Nabumetone 500 Mg  Tabs (Nabumetone) .... Take 1 Tablet By Mouth Every 12 Hours As Needed For Back Pain 9)  Omeprazole 20 Mg Cpdr (Omeprazole) .... Take 1 Capsule By Mouth Two Times A Day (Pharmacy Note Change in Dose and Sig) 10)  Seroquel 300 Mg Tabs (Quetiapine Fumarate) .... Take 2 Tablets By Mouth At Bedtime(Per [facility]) 11)  Lisinopril 40 Mg Tabs (Lisinopril) .... Take 1 Tablet By Mouth Every 12 Hours(Dr.harwani) 12)  Fluticasone Propionate 50 Mcg/act Susp (Fluticasone Propionate) .... 2 Sp Each Nostril Q Am 13)  Advair Diskus 100-50 Mcg/dose Misc (Fluticasone-Salmeterol) .Marland Kitchen.. 1 Puff 2 Times Daily 14)  Trazodone Hcl 100 Mg Tabs (Trazodone Hcl) .... Two Tabs At Bedtime 15)  Amlodipine Besylate 5 Mg Tabs (Amlodipine  Besylate) .... One Tab Each Day 16)  Oxycodone-Acetaminophen 5-325 Mg Tabs (Oxycodone-Acetaminophen) .Marland Kitchen.. 1 By Mouth Every 6 Hours As Needed 17)  Zolpidem Tartrate 5 Mg Tabs (Zolpidem Tartrate) .Marland Kitchen.. 1 By Mouth At Bedtime As Needed 18)  Loratadine 10 Mg Tabs (Loratadine) .... Take 1 Tablet By Mouth Once A Day As Needed For Allergies  Allergies (verified): 1)  ! Pcn 2)  ! Bactrim  Past History:  Past Medical History: "Coronary artery disease"   a.  normal coronaries by cath in 2007 Hypertension Systolic dysfunction   a.  EF 04-54% 10/2009 by echo. Hyperlipidemia Diabetes mellitus, type II Morbid obesity Hypothyroidism GERD Hiatal hernia Osteoarthritis Paranoid schizophrenia Asthma Cholelithiasis Obstructive sleep apnea   a. PSG 01/10/10 AHI 14.4, CPAP 10 cm  Past Surgical History: Reviewed history from 11/17/2009 and no changes required. Hysterectomy (03/20/1992) Tubal ligation (06/20/1976)  Vital Signs:  Patient profile:   60 year old female Height:      61 inches (154.94 cm) Weight:      328.50 pounds (149.32 kg) BMI:     62.29 O2 Sat:      98 % on Room air Temp:     98.0 degrees F (36.67 degrees C) oral Pulse rate:   99 / minute BP sitting:   124 / 74  (left arm) Cuff size:   large  Vitals Entered By: Michel Bickers CMA (March 31, 2010 3:41  PM)  O2 Sat at Rest %:  98 O2 Flow:  Room air CC: Hypersomnia...asthma...patient is here to discuss sleep results Comments Medications reviewed with patient Michel Bickers [facility]  March 31, 2010 3:49 PM   Physical Exam  General:  obese.   Nose:  no deformity, discharge, inflammation, or lesions Mouth:  MP 3 Neck:  no JVD.   Lungs:  diminished breath sounds, no wheezing or rales Heart:  regular rhythm, normal rate, and no murmurs.   Extremities:  2+ leg edema Neurologic:  normal CN II-XII and strength normal.   Cervical Nodes:  no significant adenopathy Psych:  anxious.     Impression & Recommendations:  Problem #  1:  OBSTRUCTIVE SLEEP APNEA (ICD-327.23) Reviewed her sleep study.  Again explained how sleep apnea can be affecting her health.  Driving precautions and need for weight loss discussed.  Will set up CPAP 10 cm H2O.  Explained techniques to get adjusted to using CPAP.  Problem # 2:  INSOMNIA (ICD-780.52) Will continue zolpidem for now.  Once she is established on CPAP will see if she can wean off sleep aides.  Complete Medication List: 1)  Synthroid 100 Mcg Tabs (Levothyroxine sodium) .... Take 1 tablet by mouth once a day (dr.harwani) 2)  Folic Acid 1 Mg Tabs (Folic acid) .Marland Kitchen.. 1 by mouth qday 3)  Lopressor 50 Mg Tabs (Metoprolol tartrate) .Marland Kitchen.. 1 by mouth bid 4)  Depakote Er 500 Mg Tb24 (Divalproex sodium) .... 2 by mouth at bedtime (per [facility]) 5)  Benztropine Mesylate 0.5 Mg Tabs (Benztropine mesylate) .Marland Kitchen.. 1 by mouth two times a day 6)  [name] 6 Mg Tb24 (Paliperidone) .Marland Kitchen.. 1 by mouth in morning(per [facility]) 7)  Nitroquick 0.4 Mg Subl (Nitroglycerin) .Marland Kitchen.. 1sl q14min as needed(dr.harwani) 8)  Nabumetone 500 Mg Tabs (Nabumetone) .... Take 1 tablet by mouth every 12 hours as needed for back pain 9)  Omeprazole 20 Mg Cpdr (Omeprazole) .... Take 1 capsule by mouth two times a day (pharmacy note change in dose and sig) 10)  Seroquel 300 Mg Tabs (Quetiapine fumarate) .... Take 2 tablets by mouth at bedtime(per [facility]) 11)  Lisinopril 40 Mg Tabs (Lisinopril) .... Take 1 tablet by mouth every 12 hours(dr.harwani) 12)  Fluticasone Propionate 50 Mcg/act Susp (Fluticasone propionate) .... 2 sp each nostril q am 13)  Advair Diskus 100-50 Mcg/dose Misc (Fluticasone-salmeterol) .Marland Kitchen.. 1 puff 2 times daily 14)  Trazodone Hcl 100 Mg Tabs (Trazodone hcl) .... Two tabs at bedtime 15)  Amlodipine Besylate 5 Mg Tabs (Amlodipine besylate) .... One tab each day 16)  Oxycodone-acetaminophen 5-325 Mg Tabs (Oxycodone-acetaminophen) .Marland Kitchen.. 1 by mouth every 6 hours as needed 17)  Zolpidem Tartrate 5 Mg Tabs (Zolpidem tartrate) .Marland Kitchen..  1 by mouth at bedtime as needed 18)  Loratadine 10 Mg Tabs (Loratadine) .... Take 1 tablet by mouth once a day as needed for allergies  Other Orders: Est. Patient Level III (16109) DME Referral (DME)  Patient Instructions: 1)  Will arrange for CPAP machine at home 2)  Follow up in 2 months   Immunization History:  Influenza Immunization History:    Influenza:  historical (02/18/2010)  Pneumovax Immunization History:    Pneumovax:  historical (05/20/2009)

## 2010-07-22 NOTE — Progress Notes (Signed)
Summary: FYI  Phone Note Call from Patient Call back at [facility] Phone [phone]   Summary of Call: weaver pt. MS Cossey CALLED BECAUSE SHE WANTS TO BE ADMITTED TO BEHAVIORAL HEALTH BECAUSE SHE IS HEARING THINGS AND SEEING THINGS.Marland Kitchen PER REGINA EDWARDS, I TOLD MS Rix THAT SHE NEEDS TO GO MENTAL HEALTH THRU EMERGENCY SERVICES, AND SHE DOESNT WANT TO BECAUSE THEY KEEP TURNING HER DOWN AND SAYING THAT SHE NEEDS TO GO TO [place]. SO I CALLED AND SPOKE W/STACEY AT [facility], AND PER STACEY, SHE NEEDS TO GO THRU [facility] SO SHE CAN BE EVALUATED AND ASSESSED AND SHE WAS ALSO GIVEN THE CRISIS HOTLINE.  MS Nakanishi IS AWARE OF ALL THIS INFORMATION, AND SHE WANTED ME TO RELAY IT TO HER MAL FRIEND WHO IS AT HER HOUS AND I DID. Initial call taken by: Leodis Rains,  May 19, 2010 5:27 PM  Follow-up for Phone Call        noted appropriate that pt go to [facility] for crisis intervention Follow-up by: Lehman Prom FNP,  May 20, 2010 8:33 AM

## 2010-07-22 NOTE — Medication Information (Signed)
Summary: SILVERSCRIPT  SILVERSCRIPT   Imported By: Arta Bruce 11/02/2009 09:59:12  _____________________________________________________________________  External Attachment:    Type:   Image     Comment:   External Document

## 2010-07-22 NOTE — Progress Notes (Signed)
Summary: fyi... stomach cancer  Phone Note Call from Patient Call back at Home Phone [phone]   Summary of Call: the pt states that the [facility] Physician needs a referral from the primary provider because the pt has stomach cancer.  Pt cannot hold any food because she start vomiting right away.  If you have any questions, the [facility] Physician number is [phone]. Alben Spittle PA Initial call taken by: Manon Hilding,  August 14, 2009 4:19 PM  Follow-up for Phone Call        tried calling pt but no answer .Marland KitchenMarland KitchenArmenia Shannon  August 20, 2009 10:13 AM   Additional Follow-up for Phone Call Additional follow up Details #1::        spoke with pt and she said that her cancer in her stomach is worst and she needs a referral to see him...Marland KitchenMarland Kitchen pt says threw blood yesterday.. pt says she will talk to you aboout the referral at her appt on March 8 Additional Follow-up by: Armenia Shannon,  August 20, 2009 3:36 PM     Appended Document: fyi... stomach cancer please call [facility] at number above and get records  ask to also send any records regarding her dx of "stomach cancer"

## 2010-07-22 NOTE — Progress Notes (Signed)
Summary: Ortho referral  Phone Note Outgoing Call   Summary of Call: Please refer to orthopedics for knee arthritis.  Referral made in Dec. but patient never heard anything. So, changed date to today. Thanks. Initial call taken by: Tereso Newcomer PA-C,  September 28, 2009 2:30 PM

## 2010-07-22 NOTE — Letter (Signed)
Summary: CALL A NURSE  CALL A NURSE   Imported By: Arta Bruce 06/16/2010 15:31:53  _____________________________________________________________________  External Attachment:    Type:   Image     Comment:   External Document

## 2010-07-22 NOTE — Assessment & Plan Note (Signed)
Summary: FU VISIT//GK   Vital Signs:  Patient profile:   60 year old female Height:      61 inches Weight:      333 pounds Temp:     97.9 degrees F oral Pulse rate:   82 / minute Pulse rhythm:   regular Resp:     13 per minute BP sitting:   111 / 75  (left arm) Cuff size:   large  Vitals Entered By: Armenia Shannon (November 30, 2009 3:39 PM) Is Patient Diabetic? No Pain Assessment Patient in pain? no       Does patient need assistance? Functional Status Self care Ambulation Normal   Primary Care Provider:  Tereso Newcomer PA-C   History of Present Illness: Here for f/u.  Notes problems with sinuses.  Uses flonase.   Notes drainage from nose - white, yellow.  No blood or blood clots.  No fevers.  No sore throat or otalgia.  Slight cough with a little yellow sputum in AM.  Had been off Flonase.  Started using again 3 mos ago.    Cholelithiasis:  Recently dx with gallstones when she was admitted for chest pain.  Saw someone at [facility] (Dr. Gershon Mussel?) and needs to have cholecystectomy.  Has to see Dr. Sharyn Lull tomorrow for clearance.  DJD:  Has not been able to schedule appt.  Now with her GB, will have to hold off.  She has number to call to reschedule.  ?sleep apnea:  Gets sleep test next month.  Was seen by Dr. Craige Cotta.  Urinary Incontinence:  Has appt with Dr. McDiarmid later this month.  Referral arranged in hospital.  Hypertension History:      She denies chest pain, dyspnea with exertion, and syncope.        Positive major cardiovascular risk factors include female age 80 years old or older and hypertension.  Negative major cardiovascular risk factors include negative family history for ischemic heart disease and non-tobacco-user status.        Positive history for target organ damage include ASHD (either angina/prior MI/prior CABG).     Problems Prior to Update: 1)  Cholelithiasis  (ICD-574.20) 2)  Insomnia  (ICD-780.52) 3)  Asthma  (ICD-493.90) 4)  Hypersomnia   (ICD-780.54) 5)  Hx of Myocardial Infarction  (ICD-410.90) 6)  Pneumonia  (ICD-486) 7)  Preventive Health Care  (ICD-V70.0) 8)  Urinalysis, Abnormal  (ICD-791.9) 9)  Uri  (ICD-465.9) 10)  Obesity  (ICD-278.00) 11)  Degenerative Joint Disease, Knees, Bilateral  (ICD-715.96) 12)  Acute Cystitis  (ICD-595.0) 13)  Dyspnea On Exertion  (ICD-786.09) 14)  Gastroenteritis, Viral  (ICD-008.8) 15)  Low Back Pain Syndrome  (ICD-724.2) 16)  Chest Pain, Hx of  (ICD-V12.50) 17)  Tardive Dyskinesia  (ICD-333.82) 18)  Rhinitis, Allergic Nos  (ICD-477.9) 19)  Hypothyroidism Nos  (ICD-244.9) 20)  Deficiency, 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 Take 1 Tablet By Mouth Once A Day (Dr.harwani) 2)  Folic Acid 1 Mg  Tabs (Folic Acid) .Marland Kitchen.. 1 By Mouth Qday 3)  Lopressor 50 Mg  Tabs (Metoprolol Tartrate) .Marland Kitchen.. 1 By Mouth Bid 4)  Depakote Er 500 Mg  Tb24 (Divalproex Sodium) .... 2 By Mouth At Bedtime (Per [facility]) 5)  Benztropine Mesylate 0.5 Mg  Tabs (Benztropine Mesylate) .Marland KitchenMarland KitchenMarland Kitchen 1  By Mouth Two Times A Day 6)  [name] 6 Mg  Tb24 (Paliperidone) .Marland Kitchen.. 1 By Mouth in Morning(Per [facility]) 7)  Nitroquick 0.4 Mg  Subl (Nitroglycerin) .Marland Kitchen.. 1sl Q38min As Needed(Dr.harwani) 8)  Nabumetone 500 Mg  Tabs (Nabumetone) .... Take 1 Tablet By Mouth Every 12 Hours As Needed For Back Pain 9)  Omeprazole 20 Mg Cpdr (Omeprazole) .... Take 1 Capsule By Mouth Two Times A Day (Pharmacy Note Change in Dose and Sig) 10)  Seroquel 300 Mg Tabs (Quetiapine Fumarate) .... Take 2 Tablets By Mouth At Bedtime(Per [facility]) 11)  Lisinopril 40 Mg Tabs (Lisinopril) .... Take 1 Tablet By Mouth Every 12 Hours(Dr.harwani) 12)  Fluticasone Propionate 50 Mcg/act Susp (Fluticasone Propionate) .... 2 Sp Each Nostril Q  Am 13)  Advair Diskus 100-50 Mcg/dose Misc (Fluticasone-Salmeterol) .Marland Kitchen.. 1 Puff 2 Times Daily 14)  Trazodone Hcl 100 Mg Tabs (Trazodone Hcl) .... Two Tabs At Bedtime 15)  Amlodipine Besylate 5 Mg Tabs (Amlodipine Besylate) .... One Tab Each Day 16)  Oxycodone-Acetaminophen 5-325 Mg Tabs (Oxycodone-Acetaminophen) .Marland Kitchen.. 1 By Mouth Every 6 Hours As Needed 17)  Zolpidem Tartrate 5 Mg Tabs (Zolpidem Tartrate) .Marland Kitchen.. 1 By Mouth At Bedtime As Needed  Allergies (verified): 1)  ! Pcn 2)  ! Bactrim  Past History:  Past Surgical History: Last updated: 11/17/2009 Hysterectomy (03/20/1992) Tubal ligation (06/20/1976)  Past Medical History: "Coronary artery disease"   a.  normal cors by cath in 2007 Hypertension Systolic dysfunction   a.  EF 56-21% 10/2009 by echo. Hyperlipidemia Diabetes mellitus, type II Morbid obesity Hypothyroidism GERD Hiatal hernia Osteoarthritis Paranoid schizophrenia Asthma Cholelithiasis  Social History: Reviewed history from 11/17/2009 and no changes required. Patient never smoked.  Lives alone Disabled  Physical Exam  General:  alert, well-developed, and well-nourished.   Head:  normocephalic and atraumatic.   Eyes:  pupils equal, pupils round, and pupils reactive to light.   Ears:  mild injection of bilat TMs bony landmarks still prominent and no bulging  Nose:  mucosal erythema.   Mouth:  pharynx pink and moist, no erythema, and no exudates.   Neck:  no cervical lymphadenopathy.   Lungs:  normal breath sounds, no crackles, and no wheezes.   Heart:  normal rate and regular rhythm.   Abdomen:  soft and non-tender.   Neurologic:  alert & oriented X3 and cranial nerves II-XII intact.   Psych:  normally interactive.     Impression & Recommendations:  Problem # 1:  RHINITIS, ALLERGIC NOS (ICD-477.9) continue flonase start claritin  Her updated medication list for this problem includes:    Fluticasone Propionate 50 Mcg/act Susp (Fluticasone  propionate) .Marland Kitchen... 2 sp each nostril q am    Loratadine 10 Mg Tabs (Loratadine) .Marland Kitchen... Take 1 tablet by mouth once a day as needed for allergies  Problem # 2:  HYPERTENSION, BENIGN ESSENTIAL (ICD-401.1) controlled  Her updated medication list for this problem includes:    Lopressor 50 Mg Tabs (Metoprolol tartrate) .Marland Kitchen... 1 by mouth bid    Lisinopril 40 Mg Tabs (Lisinopril) .Marland Kitchen... Take 1 tablet by mouth every 12 hours(dr.harwani)    Amlodipine Besylate 5 Mg Tabs (Amlodipine besylate) ..... One tab each day  Problem # 3:  DEGENERATIVE JOINT DISEASE, KNEES, BILATERAL (ICD-715.96) patient to call and schedule with ortho when she can  Her updated medication list for this problem includes:    Nabumetone 500 Mg Tabs (Nabumetone) .Marland Kitchen... Take 1 tablet by mouth every 12 hours as needed for back pain  Oxycodone-acetaminophen 5-325 Mg Tabs (Oxycodone-acetaminophen) .Marland Kitchen... 1 by mouth every 6 hours as needed  Problem # 4:  HYPOTHYROIDISM NOS (ICD-244.9) TSH 4.358 in May in hospital  Her updated medication list for this problem includes:    Synthroid 100 Mcg Tabs (Levothyroxine sodium) .Marland Kitchen... Take 1 tablet by mouth once a day (dr.harwani)  Problem # 5:  HYPERSOMNIA (ICD-780.54) sleep study pending  Problem # 6:  SCHIZOPHRENIA, PARANOID, CHRONIC (ICD-295.32) f/u [facility] has given her a slip to check her VPA level sees them q 5 mos  Problem # 7:  CHOLELITHIASIS (ICD-574.20) cholecystectomy pending  Problem # 8:  CHEST PAIN, HX OF (ICD-V12.50) f/u with Dr. Sharyn Lull  Problem # 9:  PREVENTIVE HEALTH CARE (ICD-V70.0) Lipids with LDL < 100 in hospital in May Mammo up to date schedule CPP in Jan. 2012.  Complete Medication List: 1)  Synthroid 100 Mcg Tabs (Levothyroxine sodium) .... Take 1 tablet by mouth once a day (dr.harwani) 2)  Folic Acid 1 Mg Tabs (Folic acid) .Marland Kitchen.. 1 by mouth qday 3)  Lopressor 50 Mg Tabs (Metoprolol tartrate) .Marland Kitchen.. 1 by mouth bid 4)  Depakote Er 500 Mg Tb24 (Divalproex  sodium) .... 2 by mouth at bedtime (per [facility]) 5)  Benztropine Mesylate 0.5 Mg Tabs (Benztropine mesylate) .Marland Kitchen.. 1 by mouth two times a day 6)  [name] 6 Mg Tb24 (Paliperidone) .Marland Kitchen.. 1 by mouth in morning(per [facility]) 7)  Nitroquick 0.4 Mg Subl (Nitroglycerin) .Marland Kitchen.. 1sl q70min as needed(dr.harwani) 8)  Nabumetone 500 Mg Tabs (Nabumetone) .... Take 1 tablet by mouth every 12 hours as needed for back pain 9)  Omeprazole 20 Mg Cpdr (Omeprazole) .... Take 1 capsule by mouth two times a day (pharmacy note change in dose and sig) 10)  Seroquel 300 Mg Tabs (Quetiapine fumarate) .... Take 2 tablets by mouth at bedtime(per [facility]) 11)  Lisinopril 40 Mg Tabs (Lisinopril) .... Take 1 tablet by mouth every 12 hours(dr.harwani) 12)  Fluticasone Propionate 50 Mcg/act Susp (Fluticasone propionate) .... 2 sp each nostril q am 13)  Advair Diskus 100-50 Mcg/dose Misc (Fluticasone-salmeterol) .Marland Kitchen.. 1 puff 2 times daily 14)  Trazodone Hcl 100 Mg Tabs (Trazodone hcl) .... Two tabs at bedtime 15)  Amlodipine Besylate 5 Mg Tabs (Amlodipine besylate) .... One tab each day 16)  Oxycodone-acetaminophen 5-325 Mg Tabs (Oxycodone-acetaminophen) .Marland Kitchen.. 1 by mouth every 6 hours as needed 17)  Zolpidem Tartrate 5 Mg Tabs (Zolpidem tartrate) .Marland Kitchen.. 1 by mouth at bedtime as needed 18)  Loratadine 10 Mg Tabs (Loratadine) .... Take 1 tablet by mouth once a day as needed for allergies  Hypertension Assessment/Plan:      The patient's hypertensive risk group is category C: Target organ damage and/or diabetes.  Today's blood pressure is 111/75.  Her blood pressure goal is < 140/90.   Patient Instructions: 1)  Return for CPP with Riddick Nuon in January 2012. 2)  Use claritin once daily as needed for allergies. 3)  Use saline nose spray as well, just do not use at same time as Flonase. Prescriptions: LORATADINE 10 MG TABS (LORATADINE) Take 1 tablet by mouth once a day as needed for allergies  #30 x 3   Entered and Authorized by:   Tereso Newcomer PA-C    Signed by:   Tereso Newcomer PA-C on 11/30/2009   Method used:   Print then Give to Patient   RxID:   [id number]    Orders Added: 1)  Est. Patient Level III [86578]    Vital Signs:  Patient Profile:  60 year old female Height:     61 inches (154.94 cm) Weight:      333 pounds Temp:     97.9 degrees F oral Pulse rate:   82 / minute Pulse rhythm:   regular Resp:     13 per minute BP sitting:   111 / 75 Cuff size:   large

## 2010-07-22 NOTE — Medication Information (Signed)
Summary: Order for CPAP Supplies / [facility]  Order for CPAP Supplies / [facility]   Imported By: Lennie Odor 07/09/2010 16:00:50  _____________________________________________________________________  External Attachment:    Type:   Image     Comment:   External Document

## 2010-07-22 NOTE — Medication Information (Signed)
Summary: SILVER SCRIPT//OMEPRAZOLE/APPROVED  SILVER SCRIPT//OMEPRAZOLE/APPROVED   Imported By: Arta Bruce 03/08/2010 12:15:12  _____________________________________________________________________  External Attachment:    Type:   Image     Comment:   External Document

## 2010-07-22 NOTE — Medication Information (Signed)
Summary: SILVER/SCRIPT/FAXED  SILVER/SCRIPT/FAXED   Imported By: Arta Bruce 01/05/2010 09:55:09  _____________________________________________________________________  External Attachment:    Type:   Image     Comment:   External Document

## 2010-07-22 NOTE — Progress Notes (Signed)
Summary: CXR  Phone Note From Other Clinic   Caller: Referral Coordinator Summary of Call: Please give Pt chest xray order @ appt on 11-10-09.It was faxed to me. Initial call taken by: Candi Leash,  Nov 09, 2009 4:33 PM  Follow-up for Phone Call        I believe she went back to the ED and probably does not need CXR.  She is supposed to come in tomorrow. Follow-up by: Tereso Newcomer PA-C,  Nov 09, 2009 5:54 PM  Additional Follow-up for Phone Call Additional follow up Details #1::        spoke with debra... pt did not need it she says she is better now....  Additional Follow-up by: Armenia Shannon,  Nov 11, 2009 10:28 AM

## 2010-07-22 NOTE — Letter (Signed)
Summary: CALL A NURSE  CALL A NURSE   Imported By: Arta Bruce 07/02/2010 13:43:59  _____________________________________________________________________  External Attachment:    Type:   Image     Comment:   External Document

## 2010-07-28 NOTE — Letter (Signed)
Summary: Lipid Letter  [facility]  [address] [place], [place] [address]   Phone: [phone]  Fax: [phone]    07/21/2010  [name] [address] [place], [place]  [address]  Dear Kathie Rhodes:  We have carefully reviewed your last lipid profile from 07/13/2010 and the results are noted below with a summary of recommendations for lipid management.    Cholesterol:       177     Goal: <200   HDL "good" Cholesterol:   44     Goal: >45   LDL "bad" Cholesterol:   106     Goal: <100   Triglycerides:       135     Goal: <150    Cholesterol is okay. Thyroid and anemia testing was okay, just waiting for you stool card results.    TLC Diet (Therapeutic Lifestyle Change): Saturated Fats & Transfatty acids should be kept < 7% of total calories ***Reduce Saturated Fats Polyunstaurated Fat can be up to 10% of total calories Monounsaturated Fat Fat can be up to 20% of total calories Total Fat should be no greater than 25-35% of total calories Carbohydrates should be 50-60% of total calories Protein should be approximately 15% of total calories Fiber should be at least 20-30 grams a day ***Increased fiber may help lower LDL Total Cholesterol should be < 200mg /day Consider adding plant stanol/sterols to diet (example: Benacol spread) ***A higher intake of unsaturated fat may reduce Triglycerides and Increase HDL    Adjunctive Measures (may lower LIPIDS and reduce risk of Heart Attack) include: Aerobic Exercise (20-30 minutes 3-4 times a week) Limit Alcohol Consumption Weight Reduction Aspirin 75-81 mg a day by mouth (if not allergic or contraindicated) Dietary Fiber 20-30 grams a day by mouth     Current Medications: 1)    Synthroid 100 Mcg  Tabs (Levothyroxine sodium) .... Take 1 tablet by mouth once a day (dr.harwani) 2)    Folic Acid 1 Mg  Tabs (Folic acid) .Marland Kitchen.. 1 by mouth qday 3)    Lopressor 50 Mg  Tabs (Metoprolol tartrate)  .Marland Kitchen.. 1 by mouth bid 4)    Depakote Er 500 Mg  Tb24 (Divalproex sodium) .... 2 by mouth at bedtime (per [facility]) 5)    Benztropine Mesylate 0.5 Mg  Tabs (Benztropine mesylate) .Marland Kitchen.. 1 by mouth two times a day 6)    [name] 6 Mg  Tb24 (Paliperidone) .Marland Kitchen.. 1 by mouth in morning(per [facility]) 7)    Nitroquick 0.4 Mg  Subl (Nitroglycerin) .Marland Kitchen.. 1sl q58min as needed(dr.harwani) 8)    Nabumetone 500 Mg  Tabs (Nabumetone) .... Take 1 tablet by mouth every 12 hours as needed for back pain 9)    Omeprazole 20 Mg Cpdr (Omeprazole) .... Take 1 capsule by mouth two times a day (pharmacy note change in dose and sig) 10)    Seroquel 300 Mg Tabs (Quetiapine fumarate) .... Take 2 tablets by mouth at bedtime(per [facility]) 11)    Lisinopril 40 Mg Tabs (Lisinopril) .... Take 1 tablet by mouth every 12 hours(dr.harwani) 12)    Fluticasone Propionate 50 Mcg/act Susp (Fluticasone propionate) .... 2 sp each nostril q am 13)    Advair Diskus 100-50 Mcg/dose Misc (Fluticasone-salmeterol) .Marland Kitchen.. 1 puff 2 times daily 14)    Trazodone Hcl 100 Mg Tabs (Trazodone hcl) .... Two tabs at bedtime 15)    Amlodipine Besylate 5 Mg Tabs (Amlodipine besylate) .... One tab each day 16)  Oxycodone-acetaminophen 5-325 Mg Tabs (Oxycodone-acetaminophen) .Marland Kitchen.. 1 by mouth every 6 hours as needed 17)    Zolpidem Tartrate 5 Mg Tabs (Zolpidem tartrate) .Marland Kitchen.. 1 by mouth at bedtime as needed 18)    Loratadine 10 Mg Tabs (Loratadine) .... Take 1 tablet by mouth once a day as needed for allergies  If you have any questions, please call. We appreciate being able to work with you.   Sincerely,    [facility] Tereso Newcomer PA-C

## 2010-07-28 NOTE — Progress Notes (Addendum)
Summary: Hemeoccult Card  Phone Note Outgoing Call   Call placed by: Gerilyn Nestle Call placed to: Patient Summary of Call: Called and spoke w/pt. -- Notified her that we rec'd the stool package but not the stool cards. -- Pt. only sent in the dip-sticks -- Adv. pt. that we need the stool cards. -- Gave pt. instructions how to collect the stool and it must be 3 different stools. -- She is to take a plastic disposable knife and smidge a sample on the stool card on 3 dif. occasions. -- Than she is to mail it back to [place] w/her name on it. -- Hale Drone CMA  July 19, 2010 5:07 PM      Appended Document: Hemeoccult Card    Phone Note Outgoing Call   Call placed by: Gerilyn Nestle Call placed to: Patient Summary of Call: Called pt. -- Notified her of some lab results from 07/19/10 -- Pt. states she no longer has stool cards -- Mailed pt. Hemoccult cards and gave instructions on how to obtain samples over the phone. -- If she has any questions to please call me. -- Hale Drone CMA  July 26, 2010 4:58 PM

## 2010-07-28 NOTE — Letter (Signed)
Summary: *HSN Results Follow up  [facility]  [address] [place], [place] [address]   Phone: [phone]  Fax: [phone]      07/19/2010   CUBA NATARAJAN [address] [place], [place]  [address]   Dear  Ms. Parveen Burston,                            ____S.Drinkard,FNP   ____D. Gore,FNP       ____B. McPherson,MD   ____V. Rankins,MD    ____E. Mulberry,MD    ____N. Daphine Deutscher, FNP  ____D. Reche Dixon, MD    ____K. Philipp Deputy, MD    ____Other    Comments: Please, call Outpatient Rehab [phone] to schedule an appt for you chest pain  at your earliest convrnience. Thank you        _________________________________________________________ If you have any questions, please contact our office                     Sincerely,  Cheryll Dessert [facility]

## 2010-08-01 ENCOUNTER — Emergency Department (HOSPITAL_COMMUNITY): Payer: Medicare Other

## 2010-08-01 ENCOUNTER — Emergency Department (HOSPITAL_COMMUNITY)
Admission: EM | Admit: 2010-08-01 | Discharge: 2010-08-01 | Disposition: A | Discharge status: Home / Self Care | Payer: Medicare Other | Attending: Emergency Medicine | Admitting: Emergency Medicine

## 2010-08-01 DIAGNOSIS — E039 Hypothyroidism, unspecified: Secondary | ICD-10-CM | POA: Insufficient documentation

## 2010-08-01 DIAGNOSIS — Y92009 Unspecified place in unspecified non-institutional (private) residence as the place of occurrence of the external cause: Secondary | ICD-10-CM | POA: Insufficient documentation

## 2010-08-01 DIAGNOSIS — Z79899 Other long term (current) drug therapy: Secondary | ICD-10-CM | POA: Insufficient documentation

## 2010-08-01 DIAGNOSIS — S20219A Contusion of unspecified front wall of thorax, initial encounter: Secondary | ICD-10-CM | POA: Insufficient documentation

## 2010-08-01 DIAGNOSIS — Z85118 Personal history of other malignant neoplasm of bronchus and lung: Secondary | ICD-10-CM | POA: Insufficient documentation

## 2010-08-01 DIAGNOSIS — F3289 Other specified depressive episodes: Secondary | ICD-10-CM | POA: Insufficient documentation

## 2010-08-01 DIAGNOSIS — J45909 Unspecified asthma, uncomplicated: Secondary | ICD-10-CM | POA: Insufficient documentation

## 2010-08-01 DIAGNOSIS — M545 Low back pain, unspecified: Secondary | ICD-10-CM | POA: Insufficient documentation

## 2010-08-01 DIAGNOSIS — R071 Chest pain on breathing: Secondary | ICD-10-CM | POA: Insufficient documentation

## 2010-08-01 DIAGNOSIS — F329 Major depressive disorder, single episode, unspecified: Secondary | ICD-10-CM | POA: Insufficient documentation

## 2010-08-01 DIAGNOSIS — I509 Heart failure, unspecified: Secondary | ICD-10-CM | POA: Insufficient documentation

## 2010-08-01 DIAGNOSIS — Z85028 Personal history of other malignant neoplasm of stomach: Secondary | ICD-10-CM | POA: Insufficient documentation

## 2010-08-01 DIAGNOSIS — W19XXXA Unspecified fall, initial encounter: Secondary | ICD-10-CM | POA: Insufficient documentation

## 2010-08-01 DIAGNOSIS — I10 Essential (primary) hypertension: Secondary | ICD-10-CM | POA: Insufficient documentation

## 2010-08-01 DIAGNOSIS — S335XXA Sprain of ligaments of lumbar spine, initial encounter: Secondary | ICD-10-CM | POA: Insufficient documentation

## 2010-08-02 ENCOUNTER — Ambulatory Visit: Payer: Self-pay | Admitting: Pulmonary Disease

## 2010-08-02 ENCOUNTER — Telehealth: Payer: Self-pay | Admitting: Pulmonary Disease

## 2010-08-04 ENCOUNTER — Ambulatory Visit: Payer: Self-pay | Admitting: Rehabilitation

## 2010-08-05 ENCOUNTER — Ambulatory Visit: Payer: Medicare Other | Admitting: Rehabilitation

## 2010-08-05 NOTE — Progress Notes (Signed)
Summary: LAB RESULTS   Phone Note Call from Patient   Reason for Call: Lab or Test Results Summary of Call: PT WANTS TO KNOW HER LAB RESULTS . Fuller Song HER [phone]  THANK YOU  Initial call taken by: Cheryll Dessert,  July 19, 2010 8:40 AM  Follow-up for Phone Call        Pt. notified labs have not been reviewed by MD. Will let her know once Dr. Delrae Alfred reviews. Gaylyn Cheers RN  July 19, 2010 10:07 AM   Additional Follow-up for Phone Call Additional follow up Details #1::        Let her know her labs were all in normal range.  I will send a letter with her cholesterol results.  Thyroid was also normal Additional Follow-up by: Julieanne Manson MD,  July 21, 2010 5:36 AM    Additional Follow-up for Phone Call Additional follow up Details #2::    MS Huy IS AWARE OF HER LAB RESULTS, BUT SHE WANTS TO KNOW WHY SHE STAY COLD ALL THE TIME.Cala Bradford Tinnin  July 21, 2010 4:03 PM  I do not know the answer to that--we can address at her next follow up--she also needs to complete the hemoccult cards and get those in. She did have mild anemia during her last hospitalization , though suspect may have been lab error as was fine about 11 days earlier. Recommend taking a multivitamin with iron daily--Like Centrum.  Julieanne Manson MD  July 22, 2010 1:31 PM    Additional Follow-up for Phone Call Additional follow up Details #3:: Details for Additional Follow-up Action Taken: Called pt. and notified her of the above info. -- Also asked her to mail in Hemoccult cards -- Pt. no longer has them -- Mailed Hemoccult cards to her and instructed pt. on how to obtain samples. -- Hale Drone CMA  July 26, 2010 4:56 PM

## 2010-08-07 ENCOUNTER — Emergency Department (HOSPITAL_COMMUNITY)
Admission: EM | Admit: 2010-08-07 | Discharge: 2010-08-07 | Disposition: A | Discharge status: Home / Self Care | Payer: Medicare Other | Attending: Emergency Medicine | Admitting: Emergency Medicine

## 2010-08-07 ENCOUNTER — Emergency Department (HOSPITAL_COMMUNITY): Payer: Medicare Other

## 2010-08-07 DIAGNOSIS — R05 Cough: Secondary | ICD-10-CM | POA: Insufficient documentation

## 2010-08-07 DIAGNOSIS — R1013 Epigastric pain: Secondary | ICD-10-CM | POA: Insufficient documentation

## 2010-08-07 DIAGNOSIS — I2510 Atherosclerotic heart disease of native coronary artery without angina pectoris: Secondary | ICD-10-CM | POA: Insufficient documentation

## 2010-08-07 DIAGNOSIS — F319 Bipolar disorder, unspecified: Secondary | ICD-10-CM | POA: Insufficient documentation

## 2010-08-07 DIAGNOSIS — R110 Nausea: Secondary | ICD-10-CM | POA: Insufficient documentation

## 2010-08-07 DIAGNOSIS — N390 Urinary tract infection, site not specified: Secondary | ICD-10-CM | POA: Insufficient documentation

## 2010-08-07 DIAGNOSIS — I10 Essential (primary) hypertension: Secondary | ICD-10-CM | POA: Insufficient documentation

## 2010-08-07 DIAGNOSIS — R0989 Other specified symptoms and signs involving the circulatory and respiratory systems: Secondary | ICD-10-CM | POA: Insufficient documentation

## 2010-08-07 DIAGNOSIS — R0609 Other forms of dyspnea: Secondary | ICD-10-CM | POA: Insufficient documentation

## 2010-08-07 DIAGNOSIS — K219 Gastro-esophageal reflux disease without esophagitis: Secondary | ICD-10-CM | POA: Insufficient documentation

## 2010-08-07 DIAGNOSIS — J45909 Unspecified asthma, uncomplicated: Secondary | ICD-10-CM | POA: Insufficient documentation

## 2010-08-07 DIAGNOSIS — E7800 Pure hypercholesterolemia, unspecified: Secondary | ICD-10-CM | POA: Insufficient documentation

## 2010-08-07 DIAGNOSIS — R059 Cough, unspecified: Secondary | ICD-10-CM | POA: Insufficient documentation

## 2010-08-07 DIAGNOSIS — R072 Precordial pain: Secondary | ICD-10-CM | POA: Insufficient documentation

## 2010-08-07 LAB — COMPREHENSIVE METABOLIC PANEL
ALT: <8 U/L (ref 0–35)
AST: 18 U/L (ref 0–37)
Alkaline Phosphatase: 42 U/L (ref 39–117)
CO2: 31 mEq/L (ref 19–32)
Calcium: 9.2 mg/dL (ref 8.4–10.5)
GFR calc Af Amer: >60 mL/min (ref >60)
GFR calc non Af Amer: 56 mL/min — ABNORMAL LOW (ref >60)
Potassium: 3.8 mEq/L (ref 3.5–5.1)
Sodium: 132 mEq/L — ABNORMAL LOW (ref 135–145)

## 2010-08-07 LAB — CBC
HCT: 33.1 % — ABNORMAL LOW (ref 36.0–46.0)
MCH: 32.8 pg (ref 26.0–34.0)
MCHC: 33.5 g/dL (ref 30.0–36.0)
MCV: 97.9 fL (ref 78.0–100.0)

## 2010-08-07 LAB — URINALYSIS, ROUTINE W REFLEX MICROSCOPIC
Bilirubin Urine: NEGATIVE
Hgb urine dipstick: NEGATIVE
Specific Gravity, Urine: 1.004 — ABNORMAL LOW (ref 1.005–1.030)
Urine Glucose, Fasting: NEGATIVE mg/dL
pH: 7.5 (ref 5.0–8.0)

## 2010-08-07 LAB — POCT CARDIAC MARKERS: CKMB, poc: <1 ng/mL — ABNORMAL LOW (ref 1.0–8.0)

## 2010-08-07 LAB — DIFFERENTIAL
Eosinophils Absolute: 0 10*3/uL (ref 0.0–0.7)
Eosinophils Relative: 1 % (ref 0–5)
Lymphs Abs: 2.2 10*3/uL (ref 0.7–4.0)
Monocytes Relative: 14 % — ABNORMAL HIGH (ref 3–12)

## 2010-08-07 LAB — D-DIMER, QUANTITATIVE: D-Dimer, Quant: <0.22 ug/mL-FEU (ref 0.00–0.48)

## 2010-08-07 LAB — URINE MICROSCOPIC-ADD ON

## 2010-08-10 ENCOUNTER — Telehealth (INDEPENDENT_AMBULATORY_CARE_PROVIDER_SITE_OTHER): Payer: Self-pay | Admitting: *Deleted

## 2010-08-11 ENCOUNTER — Telehealth (INDEPENDENT_AMBULATORY_CARE_PROVIDER_SITE_OTHER): Payer: Self-pay | Admitting: Internal Medicine

## 2010-08-11 NOTE — Progress Notes (Signed)
Summary: sleep study  Phone Note Call from Patient Call back at Home Phone [phone]   Caller: Patient Call For: Delroy Ordway Summary of Call: pt cancelled appt for today. says she was "just released from hospital" and will reschedule later. ALSO: pt wants another sleep study set up since she could'nt do the last one. call pt at home # or [phone] Initial call taken by: Tivis Ringer, CNA,  August 02, 2010 2:46 PM  Follow-up for Phone Call        spoke with pt and she states that the CPAP had been picked up and she needs to have a test in order to get another machine.  Pt ws just discharged from hospital yesterday she states. Pt last seen in october so I advsied she get an appt to discuss issues with Dr. Craige Cotta. Pt set to see him 08-25-10. Carron Curie CMA  August 02, 2010 5:23 PM

## 2010-08-17 NOTE — Progress Notes (Signed)
Summary: Vitamin B12 ICD code  Phone Note Other Incoming   Caller: [facility] Summary of Call: [facility] lab called needing a different ICD code for Vitamin B12 -- Per Zena Amos, gave them 266. 2. -- Hale Drone CMA  August 11, 2010 12:06 PM

## 2010-08-24 IMAGING — CR DG CHEST 2V
2 series · 2 of 2 positions shown · non-contrast
Comparison: 02/27/2009.

CLINICAL DATA: Chest pain

CHEST - 2 VIEW

[w chest pa]
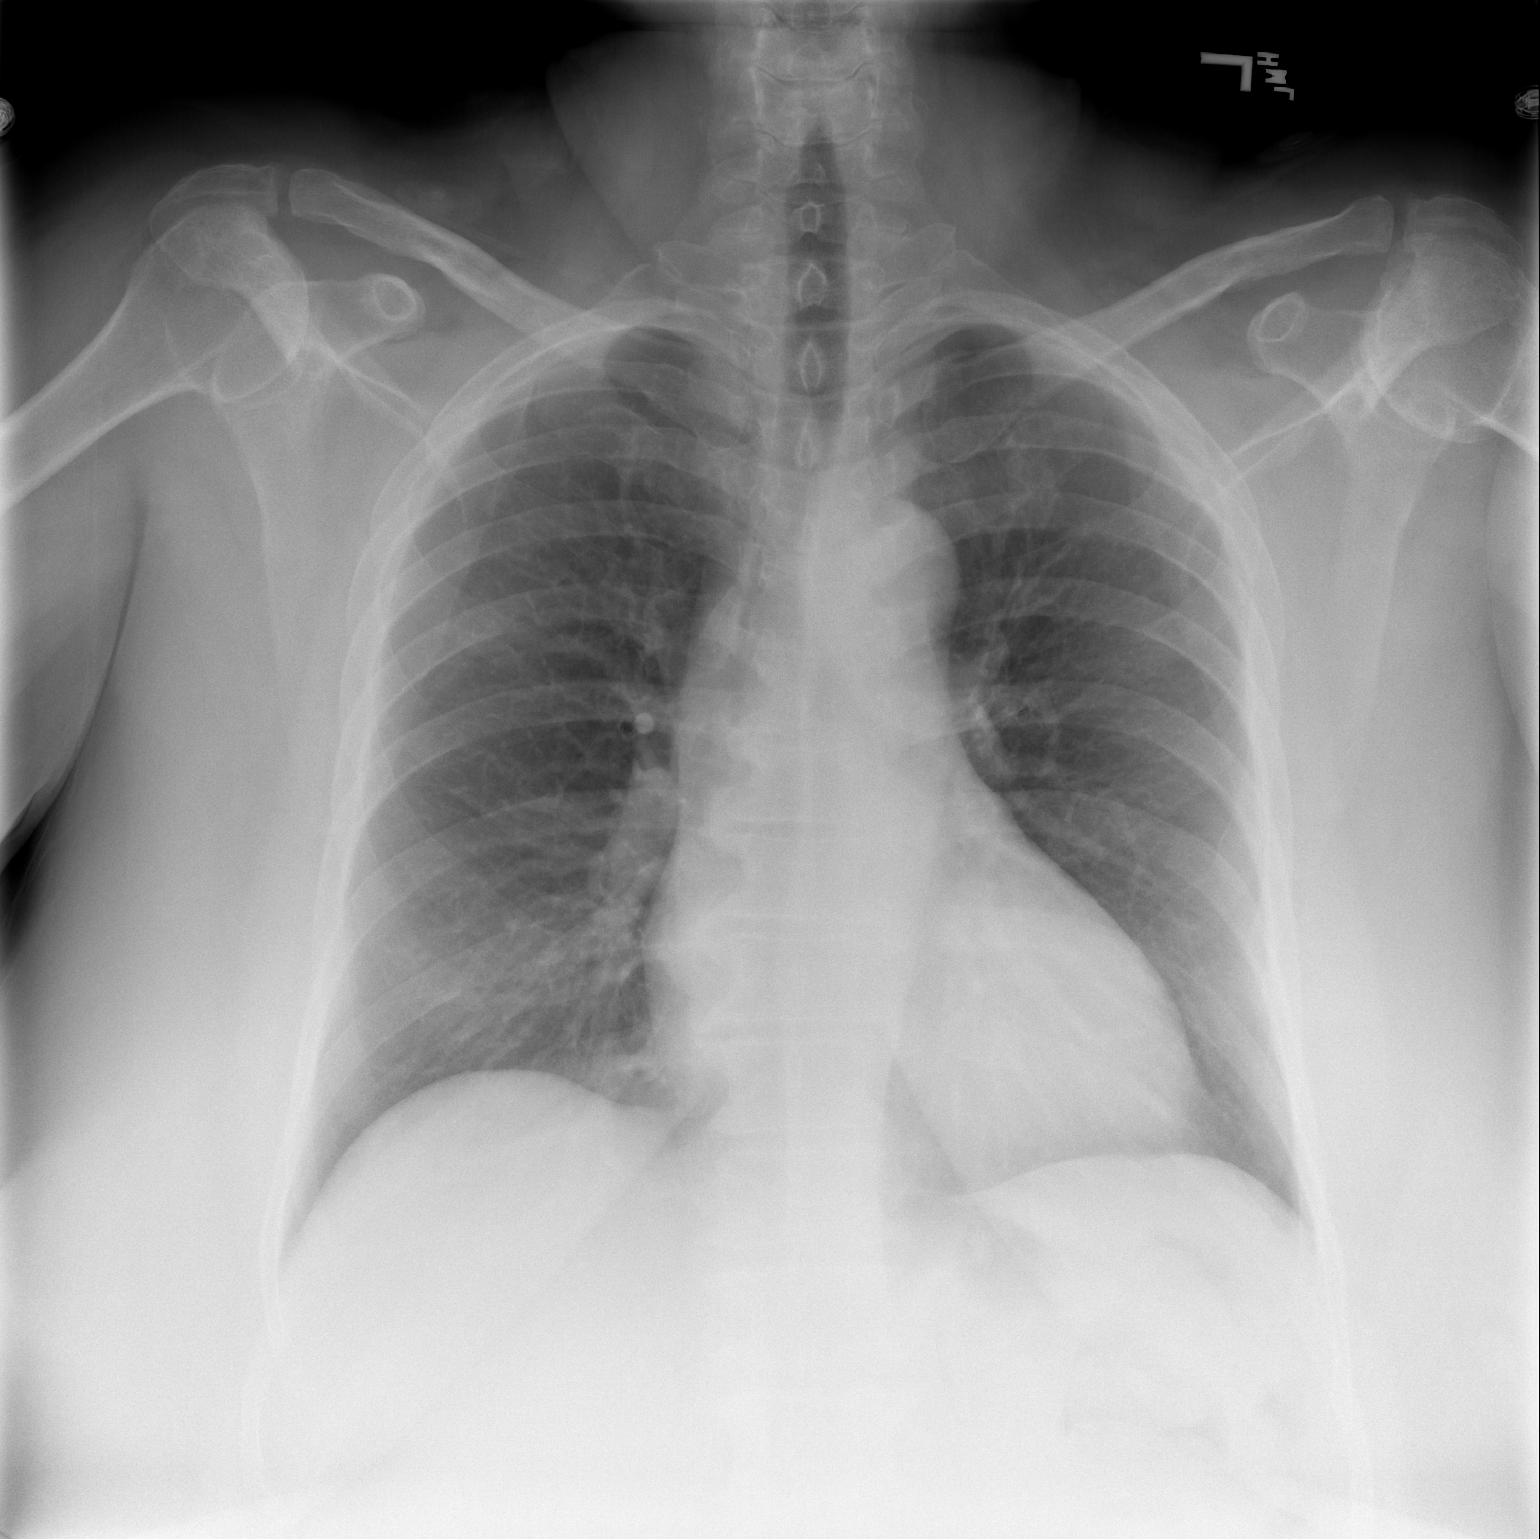

[w chest lat]
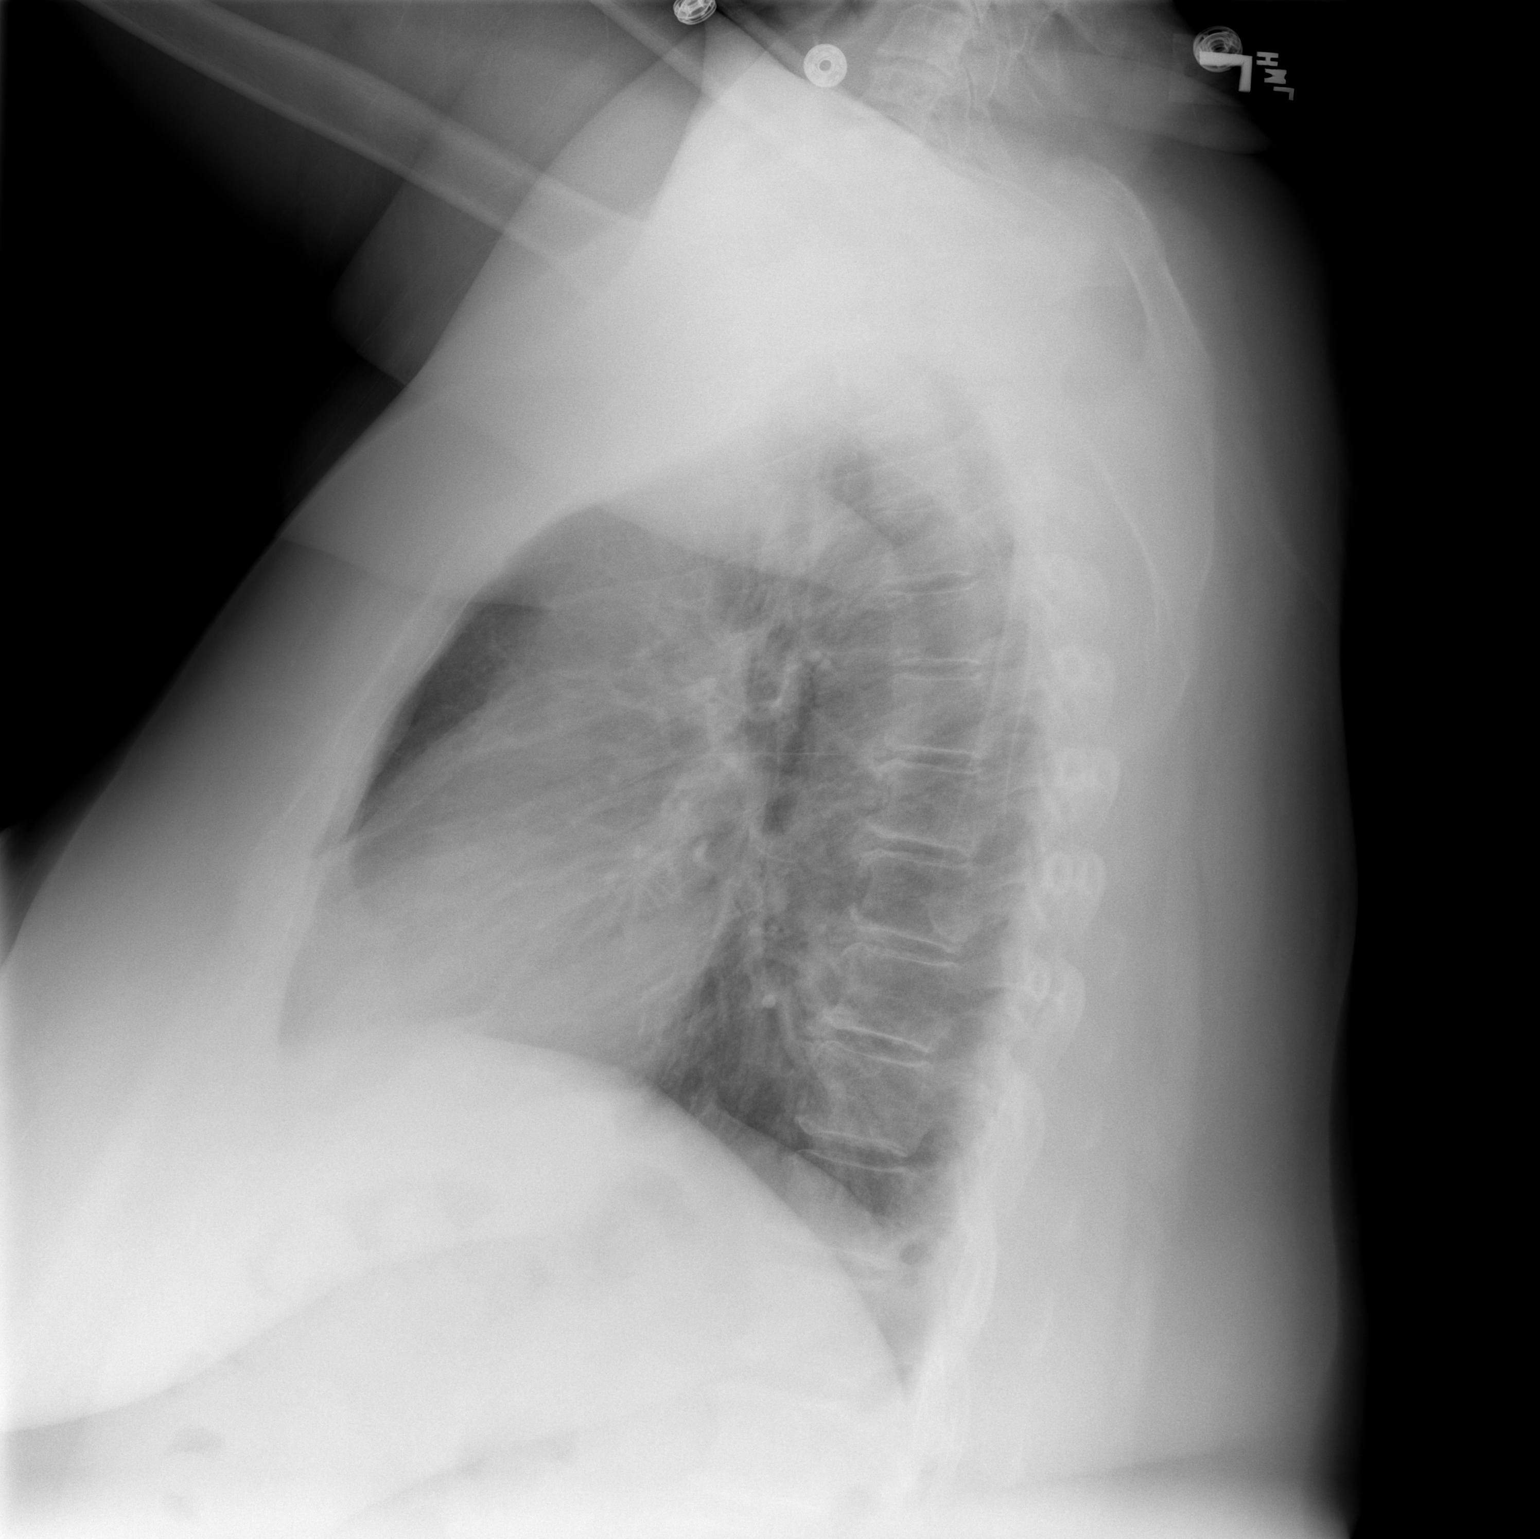

[2 of 2 positions shown; findings below may reference images not displayed]

FINDINGS: The lungs are clear without focal infiltrate, edema,
pneumothorax or pleural effusion. Cardiopericardial silhouette is
at upper limits of normal for size. Imaged bony structures of the
thorax are intact.
IMPRESSION: No acute cardiopulmonary process.

## 2010-08-24 IMAGING — CT CT ANGIO CHEST
2 of 6 series · 19 of 36 positions shown · IV contrast (APPLIED)
Comparison: Plain films chest 04/26/2009 and CT chest 04/16/2008.

CLINICAL DATA: Chest pain and shortness of breath.

CT ANGIOGRAPHY CHEST WITH CONTRAST
TECHNIQUE: Multidetector CT imaging of the chest was performed
using the standard protocol during bolus administration of
intravenous contrast. Multiplanar CT image reconstructions
including MIPs were obtained to evaluate the vascular anatomy.
Contrast: 95.9 ml Mmnipaque-ZLL D.

[Series 10: pulm embolism 1.0 b25f thins · axial · 0.74mm/px · z∈[-266,-66]mm · 18 of 224 slices shown]
[im 12/224  lung]
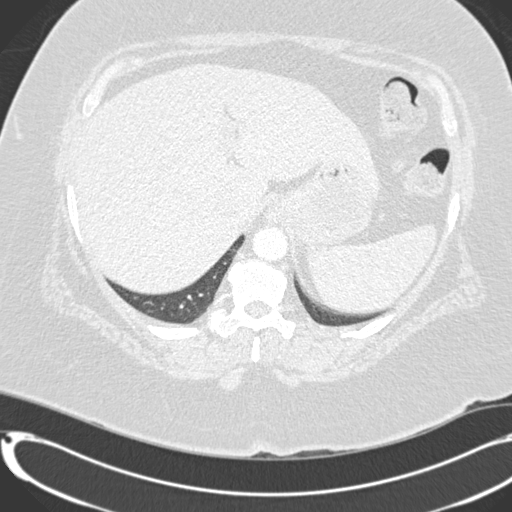
[im 23/224  mediastinal]
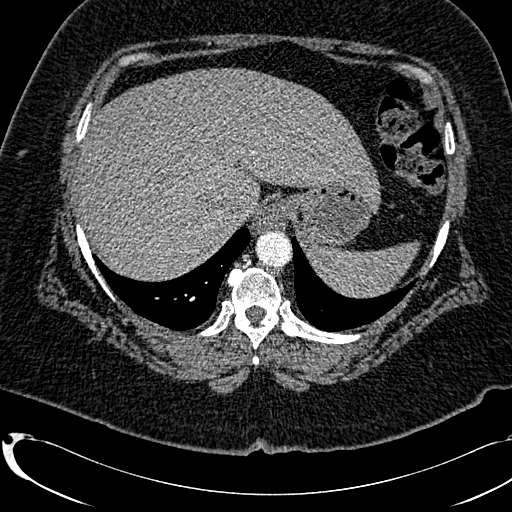
[im 34/224  lung]
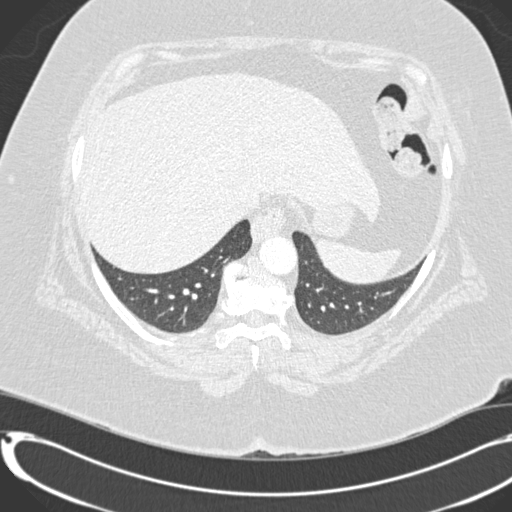
[im 45/224  mediastinal]
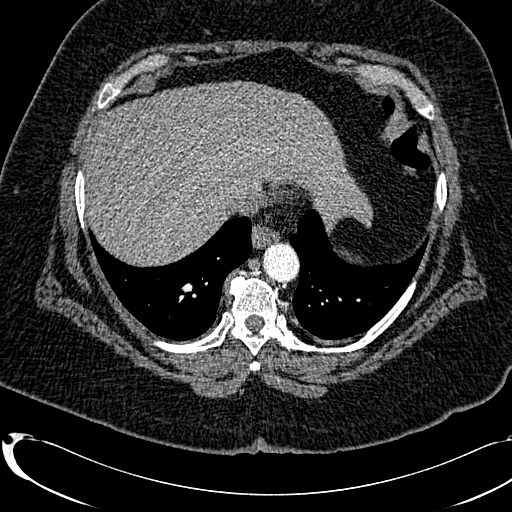
[im 56/224  lung]
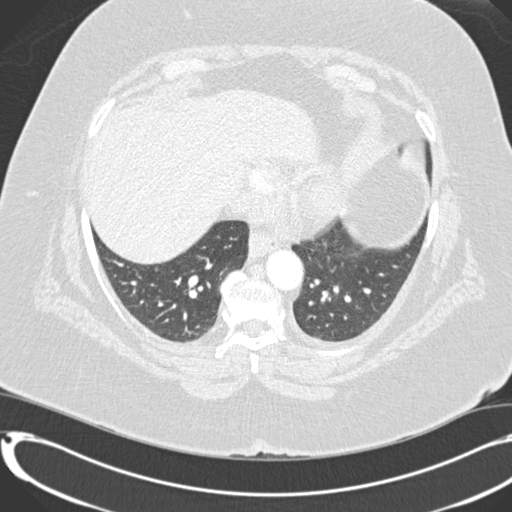
[im 67/224  mediastinal]
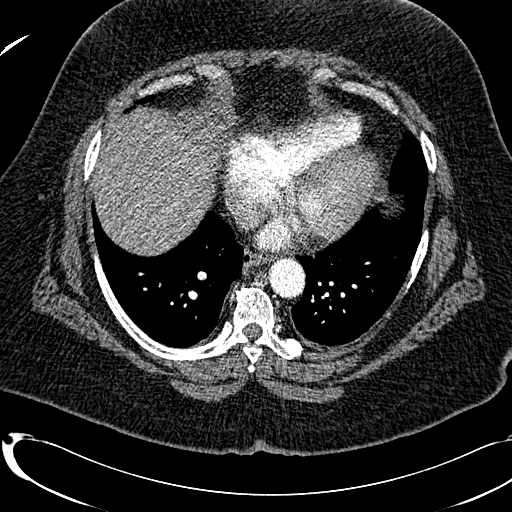
[im 79/224  lung]
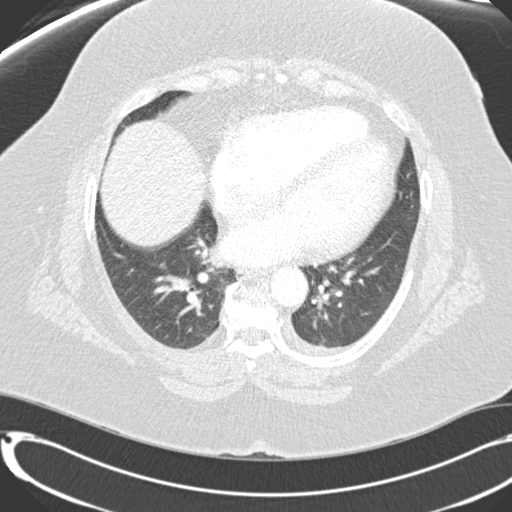
[im 90/224  mediastinal]
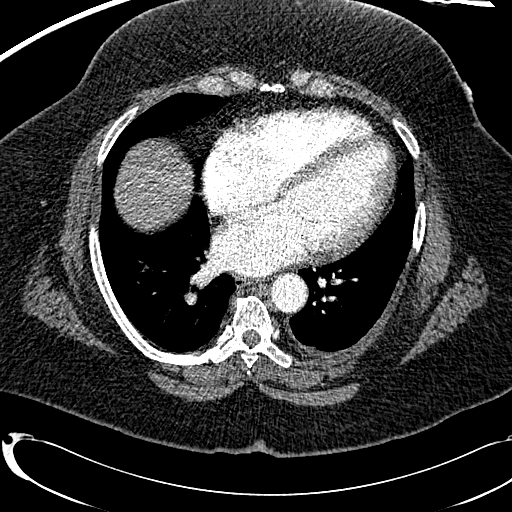
[im 101/224  lung]
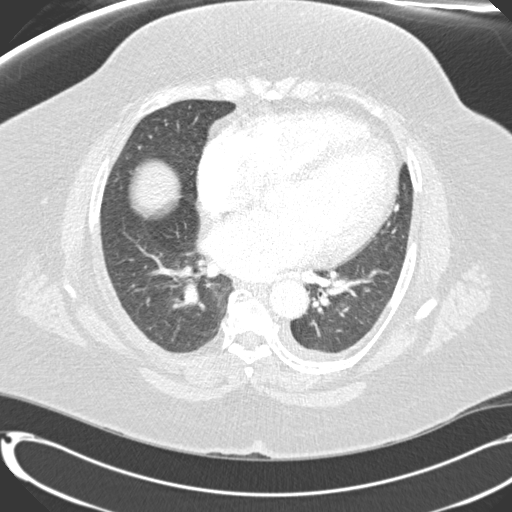
[im 123/224  mediastinal]
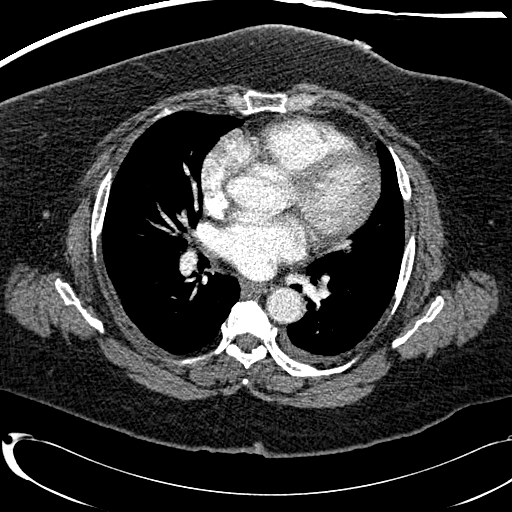
[im 134/224  lung]
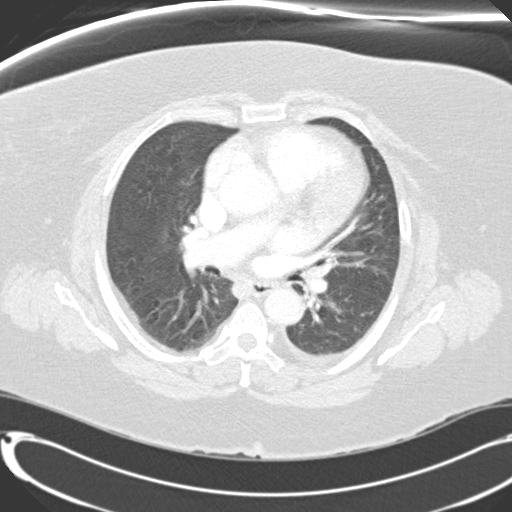
[im 145/224  mediastinal]
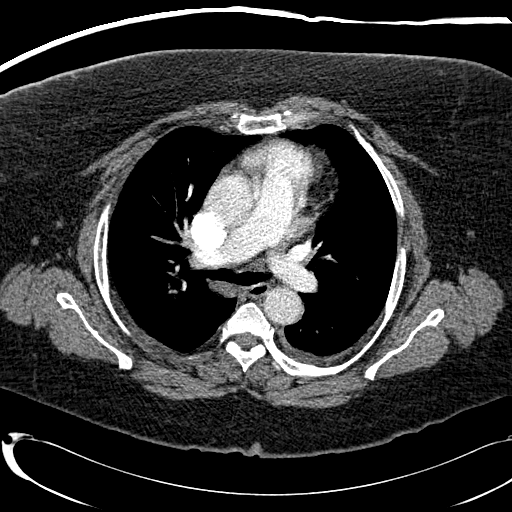
[im 157/224  lung]
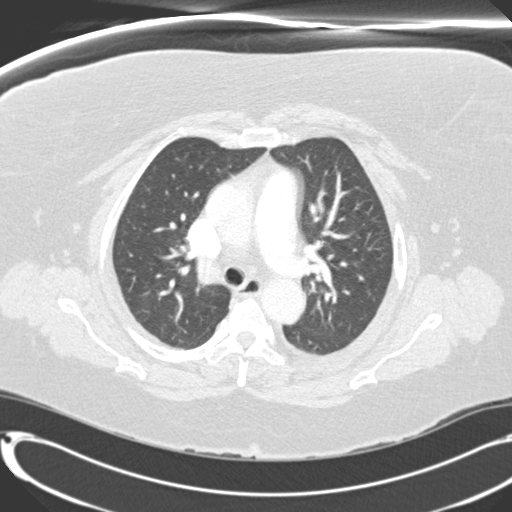
[im 168/224  mediastinal]
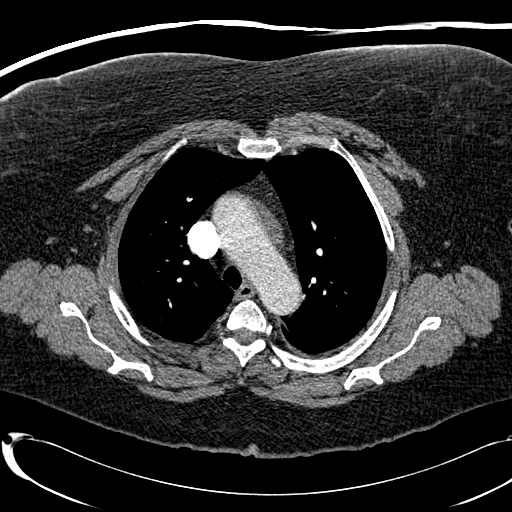
[im 179/224  lung]
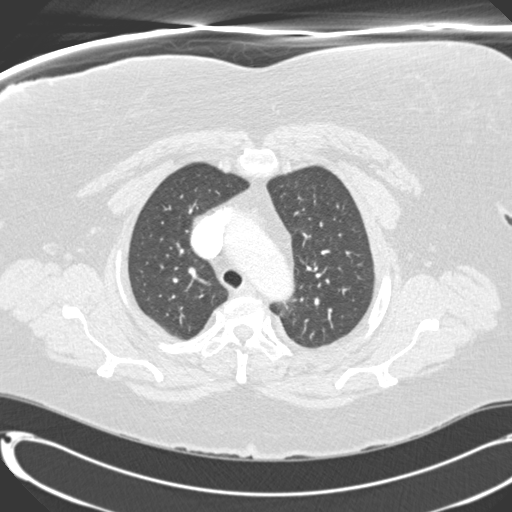
[im 190/224  mediastinal]
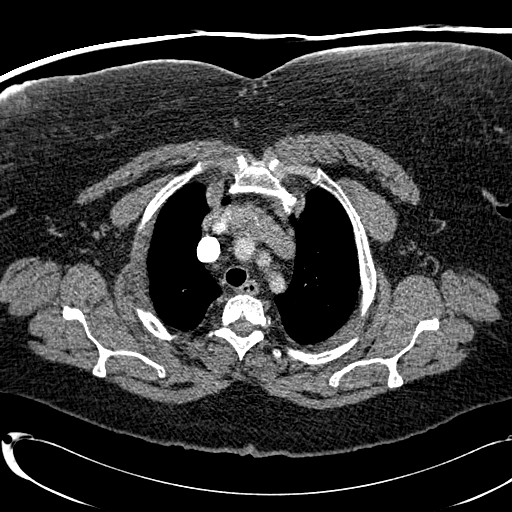
[im 201/224  lung]
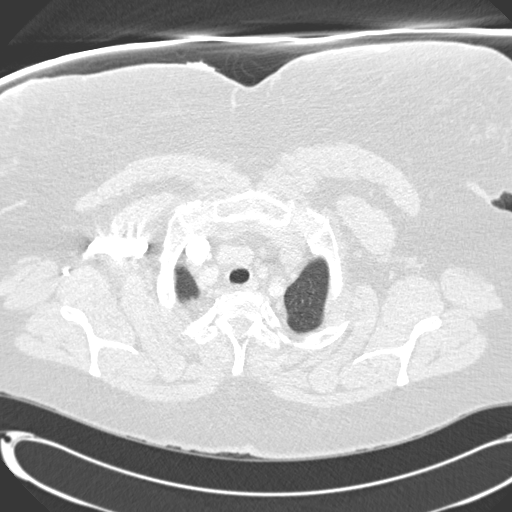
[im 212/224  mediastinal]
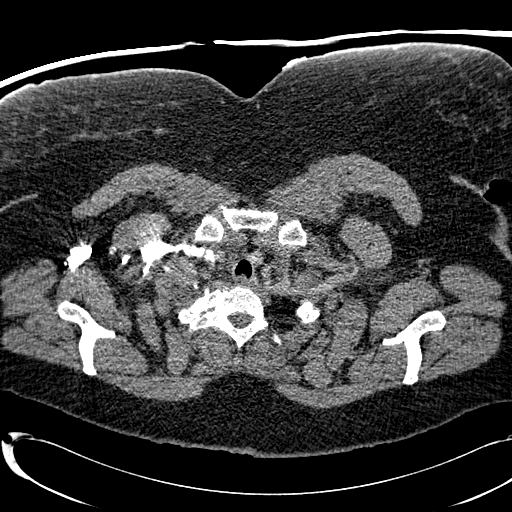

[Series 602: cor · coronal · 0.74mm/px · 1 of 113 slices shown]
[im 57/113  mediastinal]
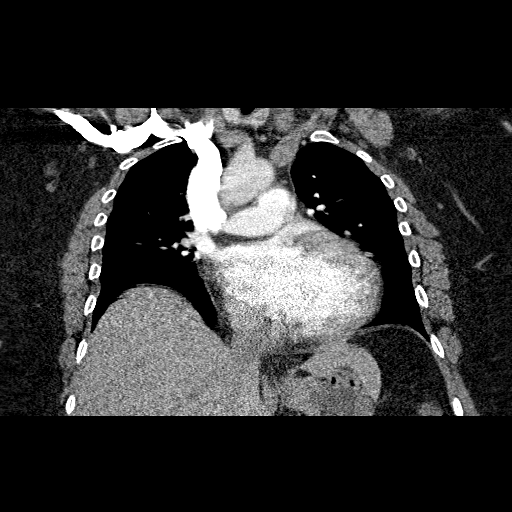

[19 of 36 positions shown; findings below may reference images not displayed]

FINDINGS: No pulmonary embolus is identified.  There is
cardiomegaly.  Small hiatal hernia is seen.  Tiny pleural effusion
on the left is noted.  No axillary, hilar or mediastinal
lymphadenopathy.  The lungs are clear.  Incidentally visualized
upper abdomen is unremarkable.  There is no focal bony abnormality.

Review of the MIP images confirms the above findings.
IMPRESSION: 1.  Negative for pulmonary embolus.
2.  Tiny left pleural effusion is noted.
3.  Small hiatal hernia.
4.  Cardiomegaly.

## 2010-08-24 IMAGING — CR DG KNEE COMPLETE 4+V*L*
4 series · 4 of 4 positions shown · non-contrast
Comparison: None.

CLINICAL DATA: Left knee pain.

LEFT KNEE - COMPLETE 4+ VIEW

[t knee ap left]
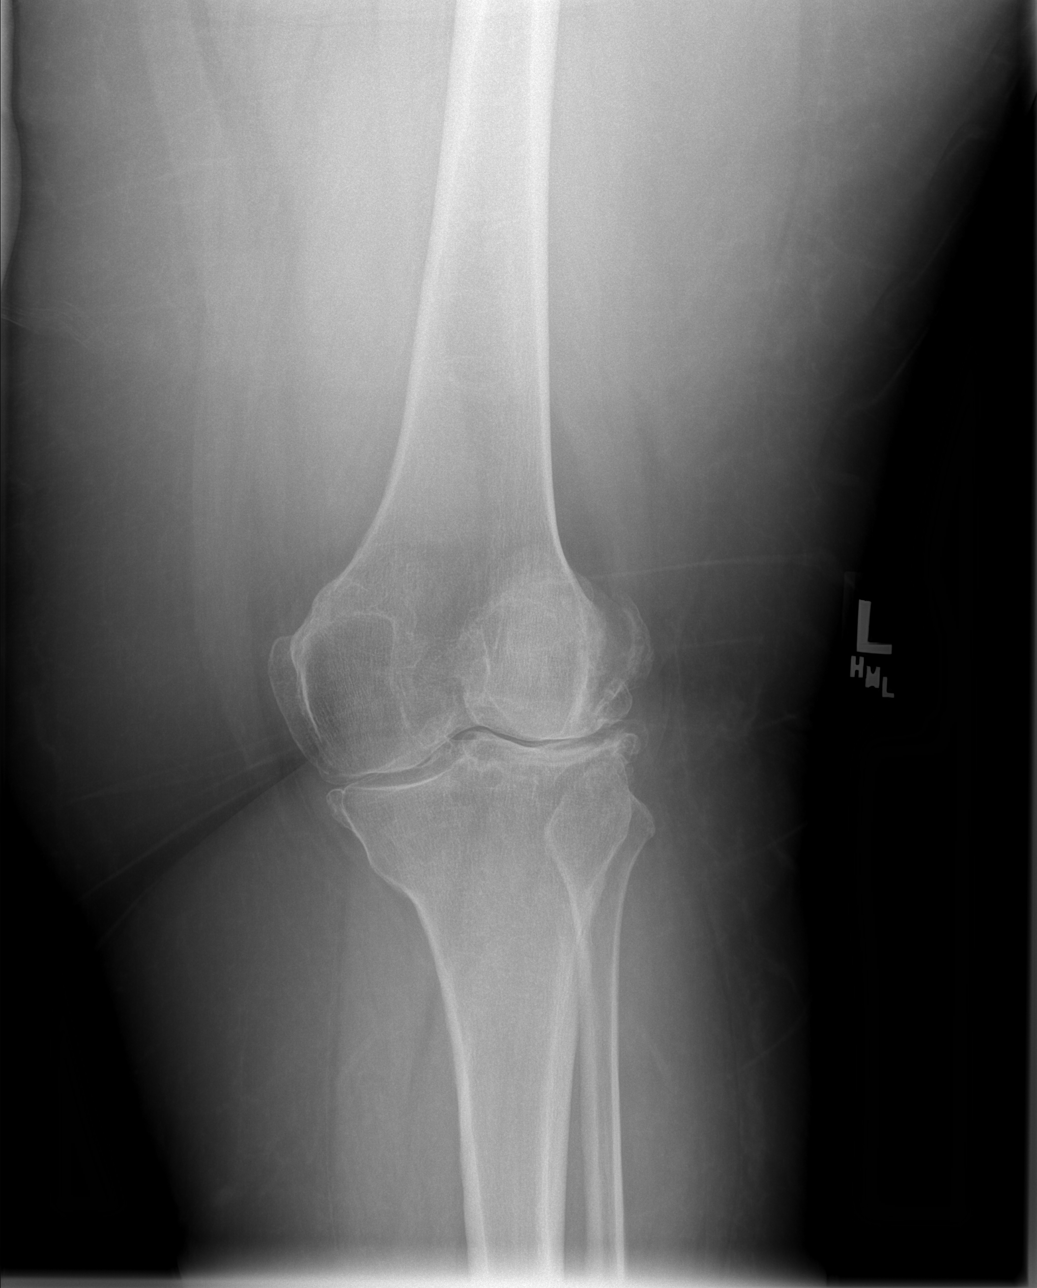

[t knee oblique left (1 of 2)]
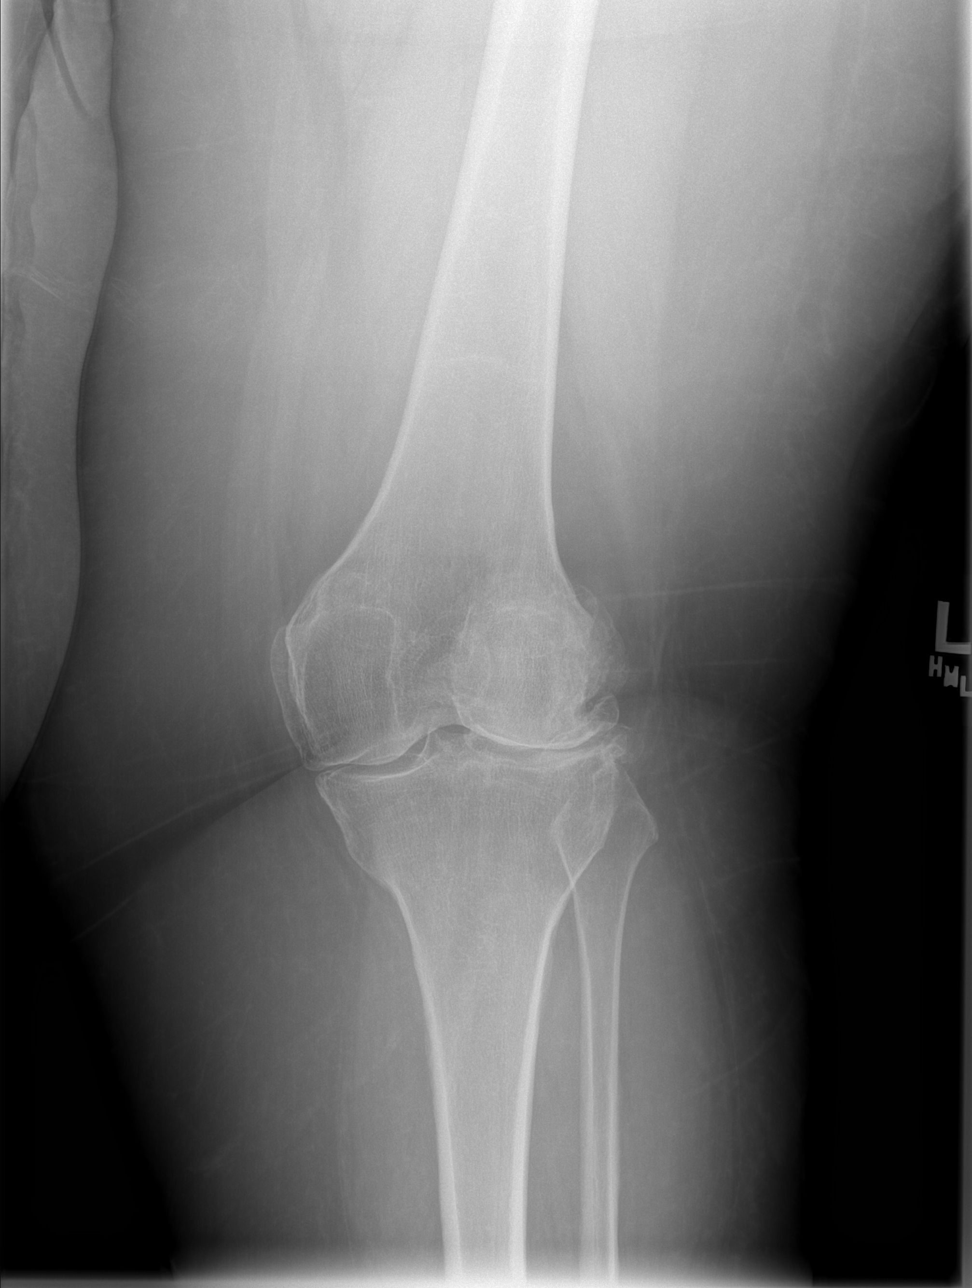

[t knee oblique left (2 of 2)]
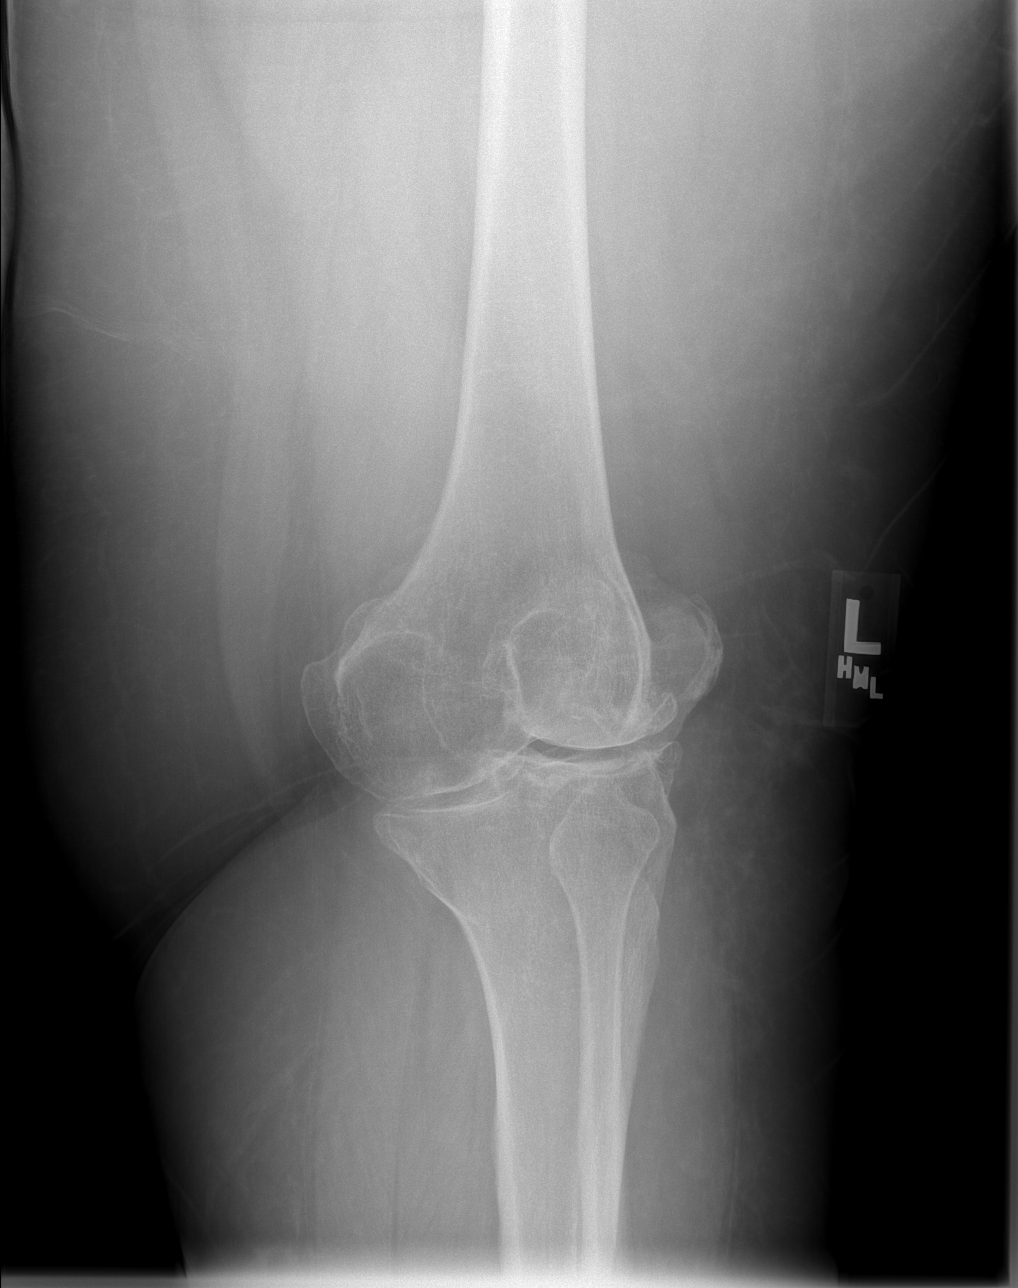

[t knee lat left]
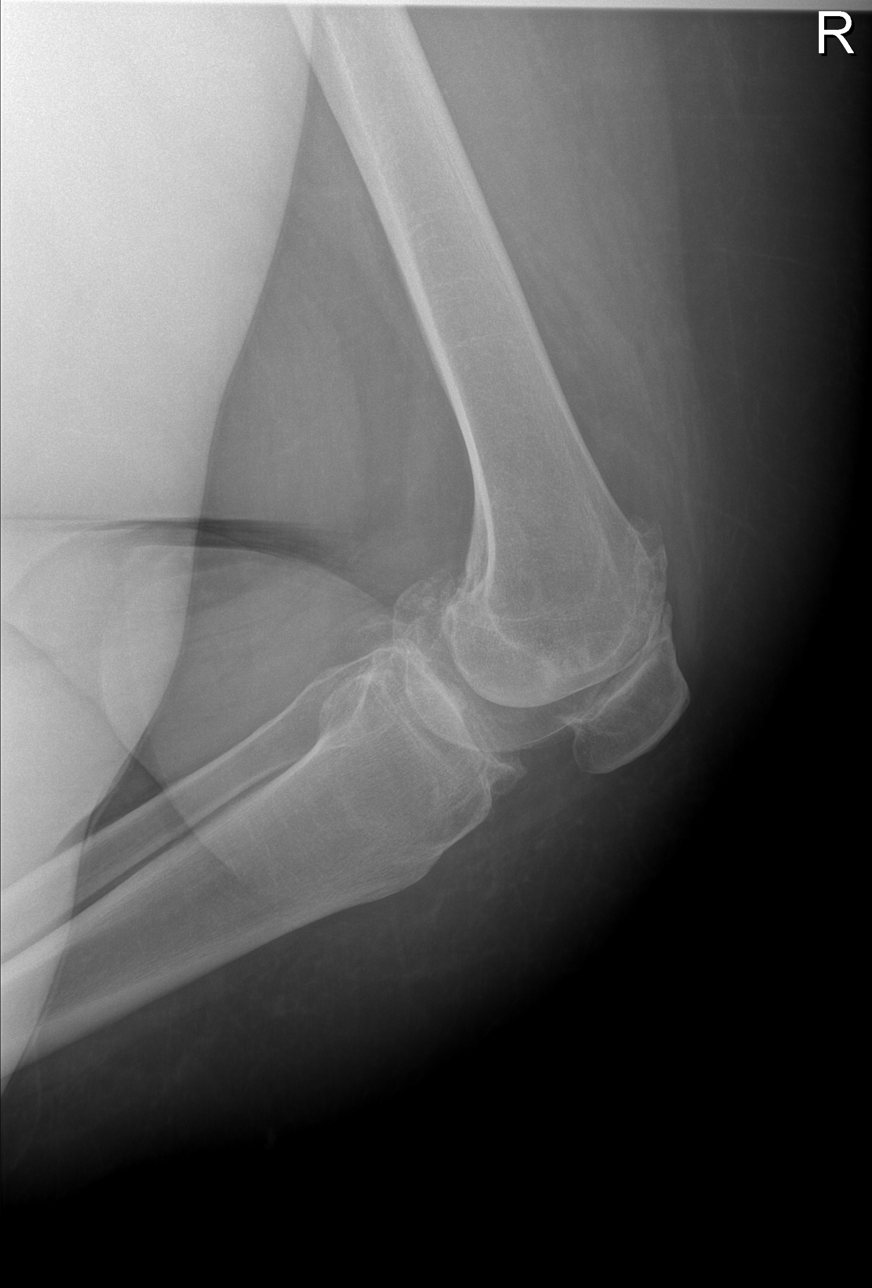

[4 of 4 positions shown; findings below may reference images not displayed]

FINDINGS: No fracture.  There is loss of joint space in both the
medial and lateral compartments.  Prominent hypertrophic spurring
is seen in the lateral and patellofemoral compartments with less
advanced spurring of the medial compartment.  No appreciable joint
effusion.
IMPRESSION: Advanced tricompartmental degenerative change.

## 2010-08-25 ENCOUNTER — Encounter: Payer: Self-pay | Admitting: Pulmonary Disease

## 2010-08-25 ENCOUNTER — Inpatient Hospital Stay (INDEPENDENT_AMBULATORY_CARE_PROVIDER_SITE_OTHER): Payer: Medicare Other | Admitting: Pulmonary Disease

## 2010-08-25 DIAGNOSIS — G4733 Obstructive sleep apnea (adult) (pediatric): Secondary | ICD-10-CM

## 2010-08-26 NOTE — Progress Notes (Signed)
Summary: pt wants results of stool cards  Phone Note Call from Patient Call back at [phone]   Summary of Call: pt states her friend dropped off her hemmocult cards and when results are completed please contact her at [phone]. Initial call taken by: Hassell Halim CMA,  August 10, 2010 9:12 AM  Follow-up for Phone Call        were checked by Steward Drone on 08/09/10....results are neg.Marland KitchenMarland KitchenMarland KitchenHassell Halim CMA  August 10, 2010 4:24 PM

## 2010-08-29 ENCOUNTER — Observation Stay (HOSPITAL_COMMUNITY)
Admission: EM | Admit: 2010-08-29 | Discharge: 2010-08-31 | Disposition: A | Discharge status: Home / Self Care | Payer: Medicare Other | Attending: Cardiology | Admitting: Cardiology

## 2010-08-29 ENCOUNTER — Emergency Department (HOSPITAL_COMMUNITY): Payer: Medicare Other

## 2010-08-29 DIAGNOSIS — I10 Essential (primary) hypertension: Secondary | ICD-10-CM | POA: Insufficient documentation

## 2010-08-29 DIAGNOSIS — F319 Bipolar disorder, unspecified: Secondary | ICD-10-CM | POA: Insufficient documentation

## 2010-08-29 DIAGNOSIS — E662 Morbid (severe) obesity with alveolar hypoventilation: Secondary | ICD-10-CM | POA: Insufficient documentation

## 2010-08-29 DIAGNOSIS — R109 Unspecified abdominal pain: Secondary | ICD-10-CM | POA: Insufficient documentation

## 2010-08-29 DIAGNOSIS — N390 Urinary tract infection, site not specified: Secondary | ICD-10-CM | POA: Insufficient documentation

## 2010-08-29 DIAGNOSIS — I252 Old myocardial infarction: Secondary | ICD-10-CM | POA: Insufficient documentation

## 2010-08-29 DIAGNOSIS — F259 Schizoaffective disorder, unspecified: Secondary | ICD-10-CM | POA: Insufficient documentation

## 2010-08-29 DIAGNOSIS — G4733 Obstructive sleep apnea (adult) (pediatric): Secondary | ICD-10-CM | POA: Insufficient documentation

## 2010-08-29 DIAGNOSIS — R0789 Other chest pain: Principal | ICD-10-CM | POA: Insufficient documentation

## 2010-08-29 DIAGNOSIS — I2510 Atherosclerotic heart disease of native coronary artery without angina pectoris: Secondary | ICD-10-CM | POA: Insufficient documentation

## 2010-08-29 DIAGNOSIS — E039 Hypothyroidism, unspecified: Secondary | ICD-10-CM | POA: Insufficient documentation

## 2010-08-29 LAB — CBC
HCT: 34.5 % — ABNORMAL LOW (ref 36.0–46.0)
Hemoglobin: 11.6 g/dL — ABNORMAL LOW (ref 12.0–15.0)
RBC: 3.52 MIL/uL — ABNORMAL LOW (ref 3.87–5.11)
WBC: 6 10*3/uL (ref 4.0–10.5)

## 2010-08-29 LAB — URINALYSIS, ROUTINE W REFLEX MICROSCOPIC
Bilirubin Urine: NEGATIVE
Glucose, UA: NEGATIVE mg/dL
Hgb urine dipstick: NEGATIVE
Ketones, ur: NEGATIVE mg/dL
Nitrite: POSITIVE — AB
Protein, ur: 30 mg/dL — AB
Specific Gravity, Urine: 1.012 (ref 1.005–1.030)
Urobilinogen, UA: 1 mg/dL (ref 0.0–1.0)
pH: 7 (ref 5.0–8.0)

## 2010-08-29 LAB — URINE MICROSCOPIC-ADD ON

## 2010-08-29 LAB — POCT CARDIAC MARKERS
CKMB, poc: <1 ng/mL — ABNORMAL LOW (ref 1.0–8.0)
Myoglobin, poc: 34.9 ng/mL (ref 12–200)
Troponin i, poc: <0.05 ng/mL (ref 0.00–0.09)

## 2010-08-29 LAB — COMPREHENSIVE METABOLIC PANEL WITH GFR
ALT: 9 U/L (ref 0–35)
AST: 14 U/L (ref 0–37)
Albumin: 3.3 g/dL — ABNORMAL LOW (ref 3.5–5.2)
Alkaline Phosphatase: 45 U/L (ref 39–117)
BUN: 2 mg/dL — ABNORMAL LOW (ref 6–23)
CO2: 32 meq/L (ref 19–32)
Calcium: 9.4 mg/dL (ref 8.4–10.5)
Chloride: 97 meq/L (ref 96–112)
Creatinine, Ser: 0.9 mg/dL (ref 0.4–1.2)
GFR calc non Af Amer: >60 mL/min
Glucose, Bld: 104 mg/dL — ABNORMAL HIGH (ref 70–99)
Potassium: 4 meq/L (ref 3.5–5.1)
Sodium: 136 meq/L (ref 135–145)
Total Bilirubin: 0.3 mg/dL (ref 0.3–1.2)
Total Protein: 6.7 g/dL (ref 6.0–8.3)

## 2010-08-29 LAB — DIFFERENTIAL
Basophils Absolute: 0 10*3/uL (ref 0.0–0.1)
Basophils Relative: 0 % (ref 0–1)
Eosinophils Absolute: 0.1 10*3/uL (ref 0.0–0.7)
Eosinophils Relative: 1 % (ref 0–5)
Lymphocytes Relative: 36 % (ref 12–46)
Lymphs Abs: 2.1 10*3/uL (ref 0.7–4.0)
Monocytes Absolute: 0.7 10*3/uL (ref 0.1–1.0)
Monocytes Relative: 11 % (ref 3–12)
Neutro Abs: 3.1 10*3/uL (ref 1.7–7.7)
Neutrophils Relative %: 52 % (ref 43–77)

## 2010-08-29 LAB — LIPASE, BLOOD: Lipase: 24 U/L (ref 11–59)

## 2010-08-30 ENCOUNTER — Telehealth (INDEPENDENT_AMBULATORY_CARE_PROVIDER_SITE_OTHER): Payer: Self-pay | Admitting: Internal Medicine

## 2010-08-30 LAB — LIPASE, BLOOD: Lipase: 32 U/L (ref 11–59)

## 2010-08-30 LAB — COMPREHENSIVE METABOLIC PANEL
ALT: 9 U/L (ref 0–35)
AST: 15 U/L (ref 0–37)
Albumin: 3.3 g/dL — ABNORMAL LOW (ref 3.5–5.2)
Alkaline Phosphatase: 48 U/L (ref 39–117)
CO2: 28 mEq/L (ref 19–32)
Calcium: 9.3 mg/dL (ref 8.4–10.5)
Calcium: 9.4 mg/dL (ref 8.4–10.5)
Creatinine, Ser: 0.94 mg/dL (ref 0.4–1.2)
GFR calc Af Amer: >60 mL/min (ref >60)
GFR calc non Af Amer: >60 mL/min (ref >60)
GFR calc non Af Amer: >60 mL/min (ref >60)
Glucose, Bld: 100 mg/dL — ABNORMAL HIGH (ref 70–99)
Sodium: 133 mEq/L — ABNORMAL LOW (ref 135–145)
Total Bilirubin: 0.4 mg/dL (ref 0.3–1.2)
Total Protein: 6.5 g/dL (ref 6.0–8.3)

## 2010-08-30 LAB — URINALYSIS, ROUTINE W REFLEX MICROSCOPIC
Bilirubin Urine: NEGATIVE
Glucose, UA: NEGATIVE mg/dL
Glucose, UA: NEGATIVE mg/dL
Hgb urine dipstick: NEGATIVE
Ketones, ur: NEGATIVE mg/dL
Nitrite: POSITIVE — AB
Specific Gravity, Urine: 1.013 (ref 1.005–1.030)
pH: 6.5 (ref 5.0–8.0)
pH: 7 (ref 5.0–8.0)

## 2010-08-30 LAB — URINE CULTURE
Colony Count: >=100000
Culture  Setup Time: 201201021150

## 2010-08-30 LAB — CBC
HCT: 34.8 % — ABNORMAL LOW (ref 36.0–46.0)
Hemoglobin: 11.8 g/dL — ABNORMAL LOW (ref 12.0–15.0)
MCH: 33.3 pg (ref 26.0–34.0)
MCHC: 33.9 g/dL (ref 30.0–36.0)
MCHC: 34 g/dL (ref 30.0–36.0)
Platelets: 195 10*3/uL (ref 150–400)

## 2010-08-30 LAB — DIFFERENTIAL
Basophils Absolute: 0 10*3/uL (ref 0.0–0.1)
Basophils Relative: 0 % (ref 0–1)
Eosinophils Absolute: 0.1 10*3/uL (ref 0.0–0.7)
Eosinophils Relative: 1 % (ref 0–5)
Lymphocytes Relative: 32 % (ref 12–46)
Lymphs Abs: 2.4 10*3/uL (ref 0.7–4.0)
Monocytes Absolute: 0.7 10*3/uL (ref 0.1–1.0)
Neutrophils Relative %: 33 % — ABNORMAL LOW (ref 43–77)

## 2010-08-30 LAB — URINE MICROSCOPIC-ADD ON

## 2010-08-30 LAB — POCT CARDIAC MARKERS
Troponin i, poc: <0.05 ng/mL (ref 0.00–0.09)
Troponin i, poc: <0.05 ng/mL (ref 0.00–0.09)

## 2010-08-31 LAB — CBC
MCH: 32.7 pg (ref 26.0–34.0)
MCH: 33.4 pg (ref 26.0–34.0)
MCHC: 32.8 g/dL (ref 30.0–36.0)
MCHC: 34.7 g/dL (ref 30.0–36.0)
MCV: 99.7 fL (ref 78.0–100.0)
Platelets: 173 10*3/uL (ref 150–400)
Platelets: 192 10*3/uL (ref 150–400)
RBC: 3.71 MIL/uL — ABNORMAL LOW (ref 3.87–5.11)
RDW: 13.7 % (ref 11.5–15.5)
RDW: 13.2 % (ref 11.5–15.5)
WBC: 5.2 10*3/uL (ref 4.0–10.5)

## 2010-08-31 LAB — COMPREHENSIVE METABOLIC PANEL
ALT: 11 U/L (ref 0–35)
Alkaline Phosphatase: 50 U/L (ref 39–117)
Calcium: 9.3 mg/dL (ref 8.4–10.5)
Creatinine, Ser: 0.85 mg/dL (ref 0.4–1.2)
GFR calc Af Amer: >60 mL/min (ref >60)
GFR calc non Af Amer: >60 mL/min (ref >60)
Total Bilirubin: 0.4 mg/dL (ref 0.3–1.2)
Total Protein: 7 g/dL (ref 6.0–8.3)

## 2010-08-31 LAB — DIFFERENTIAL
Basophils Absolute: 0 10*3/uL (ref 0.0–0.1)
Basophils Relative: 0 % (ref 0–1)
Eosinophils Absolute: 0 10*3/uL (ref 0.0–0.7)
Monocytes Relative: 12 % (ref 3–12)
Neutro Abs: 2 10*3/uL (ref 1.7–7.7)
Neutrophils Relative %: 40 % — ABNORMAL LOW (ref 43–77)

## 2010-08-31 LAB — BASIC METABOLIC PANEL
BUN: 3 mg/dL — ABNORMAL LOW (ref 6–23)
Chloride: 96 mEq/L (ref 96–112)
Creatinine, Ser: 0.98 mg/dL (ref 0.4–1.2)

## 2010-08-31 LAB — RAPID URINE DRUG SCREEN, HOSP PERFORMED
Amphetamines: NOT DETECTED
Barbiturates: NOT DETECTED

## 2010-08-31 NOTE — Assessment & Plan Note (Addendum)
Summary: hfu. discuss cpap//jrc   Copy to:  Brayton El Primary Provider/Referring Provider:  Tereso Newcomer PA-C  CC:  HFU. Pt states she is here to restart on cpa machine. Pt states her home care complany took away her machine bc she was not using it enough. pt states she is wanting to have another sleep study to see if their is any change from the last study.  History of Present Illness: 60 yo female with mild sleep apnea, and insomnia.  I last saw [name] in Oct. 2011.  She was started on CPAP, but was non-compliant with this.  As a result medicare denied payment, and her CPAP machine was taken away.  She continues to have sleep disruption and daytime sleepiness.  This has been getting worse.  She snores at night.  She falls asleep easily during the day.  She feels her breathing is getting worse at night also.   Current Medications (verified): 1)  Synthroid 100 Mcg  Tabs (Levothyroxine Sodium) .... Take 1 Tablet By Mouth Once A Day (Dr.harwani) 2)  Folic Acid 1 Mg  Tabs (Folic Acid) .Marland Kitchen.. 1 By Mouth Qday 3)  Lopressor 50 Mg  Tabs (Metoprolol Tartrate) .Marland Kitchen.. 1 By Mouth Bid 4)  Depakote Er 500 Mg  Tb24 (Divalproex Sodium) .... 2 By Mouth At Bedtime (Per [facility]) 5)  Benztropine Mesylate 0.5 Mg  Tabs (Benztropine Mesylate) .Marland Kitchen.. 1 By Mouth Two Times A Day 6)  [name] 6 Mg  Tb24 (Paliperidone) .Marland Kitchen.. 1 By Mouth in Morning(Per [facility]) 7)  Nitroquick 0.4 Mg  Subl (Nitroglycerin) .Marland Kitchen.. 1sl Q66min As Needed(Dr.harwani) 8)  Nabumetone 500 Mg  Tabs (Nabumetone) .... Take 1 Tablet By Mouth Every 12 Hours As Needed For Back Pain 9)  Omeprazole 20 Mg Cpdr (Omeprazole) .... Take 1 Capsule By Mouth Two Times A Day (Pharmacy Note Change in Dose and Sig) 10)  Seroquel 300 Mg Tabs (Quetiapine Fumarate) .... Take 2 Tablets By Mouth At Bedtime(Per [facility]) 11)  Lisinopril 40 Mg Tabs (Lisinopril) .... Take 1 Tablet By Mouth Every 12 Hours(Dr.harwani) 12)  Fluticasone Propionate 50 Mcg/act Susp (Fluticasone  Propionate) .... 2 Sp Each Nostril Q Am 13)  Advair Diskus 100-50 Mcg/dose Misc (Fluticasone-Salmeterol) .Marland Kitchen.. 1 Puff 2 Times Daily 14)  Trazodone Hcl 100 Mg Tabs (Trazodone Hcl) .... Two Tabs At Bedtime 15)  Amlodipine Besylate 5 Mg Tabs (Amlodipine Besylate) .... One Tab Each Day 16)  Oxycodone-Acetaminophen 5-325 Mg Tabs (Oxycodone-Acetaminophen) .Marland Kitchen.. 1 By Mouth Every 6 Hours As Needed 17)  Zolpidem Tartrate 5 Mg Tabs (Zolpidem Tartrate) .Marland Kitchen.. 1 By Mouth At Bedtime As Needed 18)  Loratadine 10 Mg Tabs (Loratadine) .... Take 1 Tablet By Mouth Once A Day As Needed For Allergies  Allergies (verified): 1)  ! Pcn 2)  ! Bactrim  Past History:  Past Medical History: Reviewed history from 03/31/2010 and no changes required. "Coronary artery disease"   a.  normal coronaries by cath in 2007 Hypertension Systolic dysfunction   a.  EF 84-69% 10/2009 by echo. Hyperlipidemia Diabetes mellitus, type II Morbid obesity Hypothyroidism GERD Hiatal hernia Osteoarthritis Paranoid schizophrenia Asthma Cholelithiasis Obstructive sleep apnea   a. PSG 01/10/10 AHI 14.4, CPAP 10 cm  Past Surgical History: Hysterectomy (03/20/1992) Tubal ligation (06/20/1976) Laparoscopic Cholecystectomy (01/18/2010)  Vital Signs:  Patient profile:   60 year old female Menstrual status:  Partial Hysterectomy Height:      61 inches Weight:      341.38 pounds BMI:     64.74 O2 Sat:  97 % on Room air Temp:     98 degrees F oral Pulse rate:   79 / minute BP sitting:   124 / 76  (left arm) Cuff size:   large  Vitals Entered By: Carver Fila (August 25, 2010 2:22 PM)  O2 Flow:  Room air CC: HFU. Pt states she is here to restart on cpa machine. Pt states her home care complany took away her machine bc she was not using it enough. pt states she is wanting to have another sleep study to see if their is any change from the last study Comments meds and allergies updated Phone number updated Carver Fila  August 25, 2010 2:22 PM    Physical Exam  General:  obese.   Nose:  no deformity, discharge, inflammation, or lesions Mouth:  MP 3 Neck:  no JVD.   Lungs:  diminished breath sounds, no wheezing or rales Heart:  regular rhythm, normal rate, and no murmurs.   Extremities:  1+ankle edema Neurologic:  normal CN II-XII.   Cervical Nodes:  no significant adenopathy   Impression & Recommendations:  Problem # 1:  OBSTRUCTIVE SLEEP APNEA (ICD-327.23)  She has history of sleep apnea.  She was initially non-compliant with CPAP, and as a result had her CPAP machine removed.  She has worsening sleep disruption and daytime sleepiness.  She snores while asleep.  She has cardiovascular disease.  I will schedule her for a repeat sleep test to further assess her sleep apnea.  After review of this will proceed with restarting CPAP.  Complete Medication List: 1)  Synthroid 100 Mcg Tabs (Levothyroxine sodium) .... Take 1 tablet by mouth once a day (dr.harwani) 2)  Folic Acid 1 Mg Tabs (Folic acid) .Marland Kitchen.. 1 by mouth qday 3)  Lopressor 50 Mg Tabs (Metoprolol tartrate) .Marland Kitchen.. 1 by mouth bid 4)  Depakote Er 500 Mg Tb24 (Divalproex sodium) .... 2 by mouth at bedtime (per [facility]) 5)  Benztropine Mesylate 0.5 Mg Tabs (Benztropine mesylate) .Marland Kitchen.. 1 by mouth two times a day 6)  [name] 6 Mg Tb24 (Paliperidone) .Marland Kitchen.. 1 by mouth in morning(per [facility]) 7)  Nitroquick 0.4 Mg Subl (Nitroglycerin) .Marland Kitchen.. 1sl q15min as needed(dr.harwani) 8)  Nabumetone 500 Mg Tabs (Nabumetone) .... Take 1 tablet by mouth every 12 hours as needed for back pain 9)  Omeprazole 20 Mg Cpdr (Omeprazole) .... Take 1 capsule by mouth two times a day (pharmacy note change in dose and sig) 10)  Seroquel 300 Mg Tabs (Quetiapine fumarate) .... Take 2 tablets by mouth at bedtime(per [facility]) 11)  Lisinopril 40 Mg Tabs (Lisinopril) .... Take 1 tablet by mouth every 12 hours(dr.harwani) 12)  Fluticasone Propionate 50 Mcg/act Susp (Fluticasone propionate) .... 2 sp each  nostril q am 13)  Advair Diskus 100-50 Mcg/dose Misc (Fluticasone-salmeterol) .Marland Kitchen.. 1 puff 2 times daily 14)  Trazodone Hcl 100 Mg Tabs (Trazodone hcl) .... Two tabs at bedtime 15)  Amlodipine Besylate 5 Mg Tabs (Amlodipine besylate) .... One tab each day 16)  Oxycodone-acetaminophen 5-325 Mg Tabs (Oxycodone-acetaminophen) .Marland Kitchen.. 1 by mouth every 6 hours as needed 17)  Zolpidem Tartrate 5 Mg Tabs (Zolpidem tartrate) .Marland Kitchen.. 1 by mouth at bedtime as needed 18)  Loratadine 10 Mg Tabs (Loratadine) .... Take 1 tablet by mouth once a day as needed for allergies  Other Orders: Sleep Study (Sleep Study) Est. Patient Level III (62130)  Patient Instructions: 1)  Will schedule sleep test 2)  Will call to schedule follow up after sleep  test reviewed

## 2010-09-01 LAB — URINALYSIS, ROUTINE W REFLEX MICROSCOPIC
Hgb urine dipstick: NEGATIVE
Nitrite: NEGATIVE
Specific Gravity, Urine: 1.006 (ref 1.005–1.030)
Urobilinogen, UA: 1 mg/dL (ref 0.0–1.0)
pH: 7 (ref 5.0–8.0)

## 2010-09-01 LAB — PREGNANCY, URINE: Preg Test, Ur: NEGATIVE

## 2010-09-01 LAB — POCT CARDIAC MARKERS
Myoglobin, poc: 15.9 ng/mL (ref 12–200)
Troponin i, poc: <0.05 ng/mL (ref 0.00–0.09)

## 2010-09-02 LAB — URINE CULTURE
Colony Count: NO GROWTH
Culture: NO GROWTH

## 2010-09-02 LAB — CBC
HCT: 35.1 % — ABNORMAL LOW (ref 36.0–46.0)
HCT: 34.4 % — ABNORMAL LOW (ref 36.0–46.0)
Hemoglobin: 11.5 g/dL — ABNORMAL LOW (ref 12.0–15.0)
Hemoglobin: 12 g/dL (ref 12.0–15.0)
MCH: 33.1 pg (ref 26.0–34.0)
MCH: 33.7 pg (ref 26.0–34.0)
MCHC: 33.4 g/dL (ref 30.0–36.0)
MCV: 96.4 fL (ref 78.0–100.0)
Platelets: 180 10*3/uL (ref 150–400)
RBC: 3.64 MIL/uL — ABNORMAL LOW (ref 3.87–5.11)
RBC: 3.56 MIL/uL — ABNORMAL LOW (ref 3.87–5.11)
RDW: 12.9 % (ref 11.5–15.5)
WBC: 5.8 10*3/uL (ref 4.0–10.5)
WBC: 4.8 10*3/uL (ref 4.0–10.5)

## 2010-09-02 LAB — URINALYSIS, ROUTINE W REFLEX MICROSCOPIC
Glucose, UA: NEGATIVE mg/dL
Hgb urine dipstick: NEGATIVE
Ketones, ur: NEGATIVE mg/dL
Protein, ur: NEGATIVE mg/dL

## 2010-09-02 LAB — HEPATIC FUNCTION PANEL
ALT: 13 U/L (ref 0–35)
AST: 17 U/L (ref 0–37)
Bilirubin, Direct: 0.1 mg/dL (ref 0.0–0.3)
Total Bilirubin: 0.5 mg/dL (ref 0.3–1.2)

## 2010-09-02 LAB — DIFFERENTIAL
Basophils Absolute: 0 10*3/uL (ref 0.0–0.1)
Basophils Relative: 0 % (ref 0–1)
Eosinophils Absolute: 0.1 10*3/uL (ref 0.0–0.7)
Eosinophils Absolute: 0.1 10*3/uL (ref 0.0–0.7)
Eosinophils Relative: 1 % (ref 0–5)
Eosinophils Relative: 1 % (ref 0–5)
Lymphocytes Relative: 62 % — ABNORMAL HIGH (ref 12–46)
Lymphs Abs: 3.6 10*3/uL (ref 0.7–4.0)
Lymphs Abs: 2.8 10*3/uL (ref 0.7–4.0)
Monocytes Absolute: 0.7 10*3/uL (ref 0.1–1.0)
Monocytes Absolute: 0.6 10*3/uL (ref 0.1–1.0)
Monocytes Relative: 12 % (ref 3–12)
Monocytes Relative: 9 % (ref 3–12)
Neutro Abs: 3.1 10*3/uL (ref 1.7–7.7)
Neutro Abs: 1.3 10*3/uL — ABNORMAL LOW (ref 1.7–7.7)
Neutrophils Relative %: 27 % — ABNORMAL LOW (ref 43–77)

## 2010-09-02 LAB — COMPREHENSIVE METABOLIC PANEL
ALT: 15 U/L (ref 0–35)
Alkaline Phosphatase: 47 U/L (ref 39–117)
CO2: 28 mEq/L (ref 19–32)
GFR calc non Af Amer: 58 mL/min — ABNORMAL LOW (ref >60)
Glucose, Bld: 98 mg/dL (ref 70–99)
Potassium: 3.7 mEq/L (ref 3.5–5.1)
Sodium: 134 mEq/L — ABNORMAL LOW (ref 135–145)
Total Bilirubin: 0.4 mg/dL (ref 0.3–1.2)

## 2010-09-02 LAB — POCT CARDIAC MARKERS
CKMB, poc: <1 ng/mL — ABNORMAL LOW (ref 1.0–8.0)
CKMB, poc: <1 ng/mL — ABNORMAL LOW (ref 1.0–8.0)
CKMB, poc: <1 ng/mL — ABNORMAL LOW (ref 1.0–8.0)
CKMB, poc: <1 ng/mL — ABNORMAL LOW (ref 1.0–8.0)
Myoglobin, poc: 12.5 ng/mL (ref 12–200)
Myoglobin, poc: 26.9 ng/mL (ref 12–200)
Myoglobin, poc: 28.7 ng/mL (ref 12–200)
Troponin i, poc: <0.05 ng/mL (ref 0.00–0.09)
Troponin i, poc: <0.05 ng/mL (ref 0.00–0.09)
Troponin i, poc: <0.05 ng/mL (ref 0.00–0.09)

## 2010-09-02 LAB — POCT I-STAT, CHEM 8
BUN: 7 mg/dL (ref 6–23)
Calcium, Ion: 1.2 mmol/L (ref 1.12–1.32)
Calcium, Ion: 1.18 mmol/L (ref 1.12–1.32)
Chloride: 95 mEq/L — ABNORMAL LOW (ref 96–112)
Creatinine, Ser: 0.9 mg/dL (ref 0.4–1.2)
Creatinine, Ser: 0.9 mg/dL (ref 0.4–1.2)
Creatinine, Ser: 0.8 mg/dL (ref 0.4–1.2)
Glucose, Bld: 116 mg/dL — ABNORMAL HIGH (ref 70–99)
HCT: 37 % (ref 36.0–46.0)
HCT: 40 % (ref 36.0–46.0)
Hemoglobin: 12.6 g/dL (ref 12.0–15.0)
Hemoglobin: 13.6 g/dL (ref 12.0–15.0)
Potassium: 3.9 mEq/L (ref 3.5–5.1)
Sodium: 133 mEq/L — ABNORMAL LOW (ref 135–145)
TCO2: 27 mmol/L (ref 0–100)
TCO2: 28 mmol/L (ref 0–100)

## 2010-09-02 LAB — LIPASE, BLOOD: Lipase: 33 U/L (ref 11–59)

## 2010-09-04 LAB — URINALYSIS, ROUTINE W REFLEX MICROSCOPIC
Bilirubin Urine: NEGATIVE
Bilirubin Urine: NEGATIVE
Glucose, UA: NEGATIVE mg/dL
Ketones, ur: NEGATIVE mg/dL
Ketones, ur: NEGATIVE mg/dL
Nitrite: NEGATIVE
Protein, ur: NEGATIVE mg/dL
Urobilinogen, UA: 1 mg/dL (ref 0.0–1.0)
pH: 7 (ref 5.0–8.0)

## 2010-09-04 LAB — COMPREHENSIVE METABOLIC PANEL
ALT: 11 U/L (ref 0–35)
AST: 14 U/L (ref 0–37)
AST: 15 U/L (ref 0–37)
Albumin: 3.6 g/dL (ref 3.5–5.2)
CO2: 24 mEq/L (ref 19–32)
Calcium: 9.1 mg/dL (ref 8.4–10.5)
Calcium: 9.6 mg/dL (ref 8.4–10.5)
Creatinine, Ser: 0.78 mg/dL (ref 0.4–1.2)
GFR calc Af Amer: >60 mL/min (ref >60)
GFR calc Af Amer: >60 mL/min (ref >60)
GFR calc non Af Amer: >60 mL/min (ref >60)
Sodium: 135 mEq/L (ref 135–145)
Total Protein: 6.6 g/dL (ref 6.0–8.3)

## 2010-09-04 LAB — CBC
Hemoglobin: 12 g/dL (ref 12.0–15.0)
Hemoglobin: 12.4 g/dL (ref 12.0–15.0)
MCH: 35.4 pg — ABNORMAL HIGH (ref 26.0–34.0)
MCHC: 34.1 g/dL (ref 30.0–36.0)
MCHC: 34.8 g/dL (ref 30.0–36.0)
Platelets: 205 10*3/uL (ref 150–400)
Platelets: 224 10*3/uL (ref 150–400)
RDW: 14.1 % (ref 11.5–15.5)

## 2010-09-04 LAB — PROTIME-INR
INR: 0.95 (ref 0.00–1.49)
Prothrombin Time: 12.6 seconds (ref 11.6–15.2)

## 2010-09-04 LAB — SURGICAL PCR SCREEN
MRSA, PCR: NEGATIVE
Staphylococcus aureus: POSITIVE — AB

## 2010-09-04 LAB — DIFFERENTIAL
Eosinophils Relative: 0 % (ref 0–5)
Lymphocytes Relative: 44 % (ref 12–46)
Lymphs Abs: 2 10*3/uL (ref 0.7–4.0)

## 2010-09-05 LAB — BRAIN NATRIURETIC PEPTIDE: Pro B Natriuretic peptide (BNP): <30 pg/mL (ref 0.0–100.0)

## 2010-09-05 LAB — POCT CARDIAC MARKERS
CKMB, poc: <1 ng/mL — ABNORMAL LOW (ref 1.0–8.0)
Myoglobin, poc: 22.8 ng/mL (ref 12–200)
Troponin i, poc: <0.05 ng/mL (ref 0.00–0.09)

## 2010-09-05 LAB — URINE MICROSCOPIC-ADD ON

## 2010-09-05 LAB — CBC
MCH: 34.6 pg — ABNORMAL HIGH (ref 26.0–34.0)
MCH: 35 pg — ABNORMAL HIGH (ref 26.0–34.0)
MCHC: 33.7 g/dL (ref 30.0–36.0)
MCHC: 34.2 g/dL (ref 30.0–36.0)
MCV: 101.7 fL — ABNORMAL HIGH (ref 78.0–100.0)
Platelets: 178 10*3/uL (ref 150–400)
Platelets: 178 10*3/uL (ref 150–400)
Platelets: 182 10*3/uL (ref 150–400)
Platelets: 205 10*3/uL (ref 150–400)
RBC: 3.17 MIL/uL — ABNORMAL LOW (ref 3.87–5.11)
RBC: 3.45 MIL/uL — ABNORMAL LOW (ref 3.87–5.11)
RDW: 13.6 % (ref 11.5–15.5)
RDW: 13.7 % (ref 11.5–15.5)
WBC: 5.4 10*3/uL (ref 4.0–10.5)
WBC: 5.9 10*3/uL (ref 4.0–10.5)

## 2010-09-05 LAB — COMPREHENSIVE METABOLIC PANEL
ALT: <8 U/L (ref 0–35)
AST: 13 U/L (ref 0–37)
Albumin: 3.4 g/dL — ABNORMAL LOW (ref 3.5–5.2)
Calcium: 8.5 mg/dL (ref 8.4–10.5)
Chloride: 99 mEq/L (ref 96–112)
Creatinine, Ser: 0.71 mg/dL (ref 0.4–1.2)
GFR calc Af Amer: >60 mL/min (ref >60)
Sodium: 131 mEq/L — ABNORMAL LOW (ref 135–145)
Total Bilirubin: 0.4 mg/dL (ref 0.3–1.2)

## 2010-09-05 LAB — DIFFERENTIAL
Eosinophils Absolute: 0 10*3/uL (ref 0.0–0.7)
Eosinophils Relative: 1 % (ref 0–5)
Lymphocytes Relative: 45 % (ref 12–46)
Lymphs Abs: 2.6 10*3/uL (ref 0.7–4.0)
Monocytes Absolute: 0.6 10*3/uL (ref 0.1–1.0)

## 2010-09-05 LAB — BASIC METABOLIC PANEL
BUN: 4 mg/dL — ABNORMAL LOW (ref 6–23)
BUN: 7 mg/dL (ref 6–23)
CO2: 30 mEq/L (ref 19–32)
Calcium: 8.9 mg/dL (ref 8.4–10.5)
Chloride: 97 mEq/L (ref 96–112)
Creatinine, Ser: 0.73 mg/dL (ref 0.4–1.2)
Creatinine, Ser: 0.76 mg/dL (ref 0.4–1.2)
Creatinine, Ser: 0.84 mg/dL (ref 0.4–1.2)
GFR calc Af Amer: >60 mL/min (ref >60)
GFR calc Af Amer: >60 mL/min (ref >60)
GFR calc non Af Amer: >60 mL/min (ref >60)
Glucose, Bld: 104 mg/dL — ABNORMAL HIGH (ref 70–99)

## 2010-09-05 LAB — URINALYSIS, ROUTINE W REFLEX MICROSCOPIC
Glucose, UA: NEGATIVE mg/dL
Hgb urine dipstick: NEGATIVE
Specific Gravity, Urine: 1.012 (ref 1.005–1.030)
Urobilinogen, UA: 0.2 mg/dL (ref 0.0–1.0)

## 2010-09-05 LAB — RETICULOCYTES
Retic Count, Absolute: 80 10*3/uL (ref 19.0–186.0)
Retic Ct Pct: 2.5 % (ref 0.4–3.1)

## 2010-09-05 LAB — CK TOTAL AND CKMB (NOT AT ARMC): CK, MB: 0.7 ng/mL (ref 0.3–4.0)

## 2010-09-05 LAB — POCT I-STAT, CHEM 8
BUN: <3 mg/dL — ABNORMAL LOW (ref 6–23)
Chloride: 98 mEq/L (ref 96–112)
Sodium: 134 mEq/L — ABNORMAL LOW (ref 135–145)

## 2010-09-05 LAB — APTT: aPTT: 31 seconds (ref 24–37)

## 2010-09-05 LAB — POCT PREGNANCY, URINE: Preg Test, Ur: NEGATIVE

## 2010-09-05 LAB — LIPID PANEL
Cholesterol: 173 mg/dL (ref 0–200)
HDL: 43 mg/dL (ref >39)
Triglycerides: 144 mg/dL (ref <150)

## 2010-09-05 LAB — CARDIAC PANEL(CRET KIN+CKTOT+MB+TROPI)
CK, MB: 0.8 ng/mL (ref 0.3–4.0)
Troponin I: 0.01 ng/mL (ref 0.00–0.06)
Troponin I: 0.01 ng/mL (ref 0.00–0.06)

## 2010-09-05 LAB — ETHANOL: Alcohol, Ethyl (B): <5 mg/dL (ref 0–10)

## 2010-09-05 LAB — HEMOGLOBIN A1C
Hgb A1c MFr Bld: 5.5 % (ref <5.7)
Mean Plasma Glucose: 111 mg/dL (ref <117)

## 2010-09-05 LAB — TROPONIN I: Troponin I: 0.01 ng/mL (ref 0.00–0.06)

## 2010-09-05 LAB — IRON AND TIBC
Saturation Ratios: 25 % (ref 20–55)
TIBC: 292 ug/dL (ref 250–470)

## 2010-09-05 LAB — URINE CULTURE

## 2010-09-05 LAB — OSMOLALITY: Osmolality: 260 mOsm/kg — ABNORMAL LOW (ref 275–300)

## 2010-09-05 LAB — VITAMIN B12: Vitamin B-12: 307 pg/mL (ref 211–911)

## 2010-09-06 LAB — BASIC METABOLIC PANEL
Chloride: 99 mEq/L (ref 96–112)
Creatinine, Ser: 0.64 mg/dL (ref 0.4–1.2)
GFR calc Af Amer: >60 mL/min (ref >60)
GFR calc non Af Amer: >60 mL/min (ref >60)
Potassium: 3.7 mEq/L (ref 3.5–5.1)

## 2010-09-06 LAB — CK TOTAL AND CKMB (NOT AT ARMC)
Relative Index: 2.1 (ref 0.0–2.5)
Relative Index: INVALID (ref 0.0–2.5)
Total CK: 164 U/L (ref 7–177)

## 2010-09-06 LAB — LIPID PANEL
HDL: 44 mg/dL (ref >39)
LDL Cholesterol: 108 mg/dL — ABNORMAL HIGH (ref 0–99)
Total CHOL/HDL Ratio: 4.1 RATIO
Triglycerides: 148 mg/dL (ref <150)
VLDL: 30 mg/dL (ref 0–40)

## 2010-09-06 LAB — POCT CARDIAC MARKERS
CKMB, poc: <1 ng/mL — ABNORMAL LOW (ref 1.0–8.0)
Myoglobin, poc: 15.8 ng/mL (ref 12–200)
Troponin i, poc: <0.05 ng/mL (ref 0.00–0.09)

## 2010-09-06 LAB — RAPID URINE DRUG SCREEN, HOSP PERFORMED
Cocaine: NOT DETECTED
Tetrahydrocannabinol: NOT DETECTED

## 2010-09-06 LAB — CBC
MCV: 102.1 fL — ABNORMAL HIGH (ref 78.0–100.0)
Platelets: 162 10*3/uL (ref 150–400)
RBC: 3.54 MIL/uL — ABNORMAL LOW (ref 3.87–5.11)
WBC: 4.9 10*3/uL (ref 4.0–10.5)

## 2010-09-06 LAB — GLUCOSE, CAPILLARY: Glucose-Capillary: 111 mg/dL — ABNORMAL HIGH (ref 70–99)

## 2010-09-06 LAB — VALPROIC ACID LEVEL: Valproic Acid Lvl: 64 ug/mL (ref 50.0–100.0)

## 2010-09-06 LAB — TROPONIN I: Troponin I: 0.01 ng/mL (ref 0.00–0.06)

## 2010-09-06 LAB — DIFFERENTIAL
Eosinophils Absolute: 0 10*3/uL (ref 0.0–0.7)
Lymphocytes Relative: 52 % — ABNORMAL HIGH (ref 12–46)
Lymphs Abs: 2.5 10*3/uL (ref 0.7–4.0)
Monocytes Relative: 13 % — ABNORMAL HIGH (ref 3–12)
Neutro Abs: 1.7 10*3/uL (ref 1.7–7.7)
Neutrophils Relative %: 34 % — ABNORMAL LOW (ref 43–77)

## 2010-09-06 LAB — D-DIMER, QUANTITATIVE: D-Dimer, Quant: <0.22 ug/mL-FEU (ref 0.00–0.48)

## 2010-09-07 LAB — DIFFERENTIAL
Basophils Absolute: 0 10*3/uL (ref 0.0–0.1)
Basophils Absolute: 0.1 10*3/uL (ref 0.0–0.1)
Eosinophils Absolute: 0 10*3/uL (ref 0.0–0.7)
Eosinophils Relative: 1 % (ref 0–5)
Eosinophils Relative: 1 % (ref 0–5)
Lymphocytes Relative: 53 % — ABNORMAL HIGH (ref 12–46)
Lymphocytes Relative: 37 % (ref 12–46)
Lymphs Abs: 3.4 10*3/uL (ref 0.7–4.0)
Monocytes Absolute: 0.8 10*3/uL (ref 0.1–1.0)
Neutrophils Relative %: 35 % — ABNORMAL LOW (ref 43–77)

## 2010-09-07 LAB — BASIC METABOLIC PANEL
BUN: 4 mg/dL — ABNORMAL LOW (ref 6–23)
BUN: 6 mg/dL (ref 6–23)
Calcium: 9.3 mg/dL (ref 8.4–10.5)
Chloride: 99 mEq/L (ref 96–112)
Creatinine, Ser: 0.7 mg/dL (ref 0.4–1.2)
Creatinine, Ser: 0.85 mg/dL (ref 0.4–1.2)
GFR calc Af Amer: >60 mL/min (ref >60)
GFR calc non Af Amer: >60 mL/min (ref >60)
Glucose, Bld: 106 mg/dL — ABNORMAL HIGH (ref 70–99)
Potassium: 3.5 mEq/L (ref 3.5–5.1)

## 2010-09-07 LAB — GLUCOSE, CAPILLARY
Glucose-Capillary: 103 mg/dL — ABNORMAL HIGH (ref 70–99)
Glucose-Capillary: 104 mg/dL — ABNORMAL HIGH (ref 70–99)
Glucose-Capillary: 102 mg/dL — ABNORMAL HIGH (ref 70–99)

## 2010-09-07 LAB — COMPREHENSIVE METABOLIC PANEL
ALT: 13 U/L (ref 0–35)
Alkaline Phosphatase: 41 U/L (ref 39–117)
BUN: 4 mg/dL — ABNORMAL LOW (ref 6–23)
CO2: 28 mEq/L (ref 19–32)
Chloride: 98 mEq/L (ref 96–112)
GFR calc non Af Amer: >60 mL/min (ref >60)
Glucose, Bld: 122 mg/dL — ABNORMAL HIGH (ref 70–99)
Potassium: 3.6 mEq/L (ref 3.5–5.1)
Sodium: 132 mEq/L — ABNORMAL LOW (ref 135–145)
Total Bilirubin: 0.6 mg/dL (ref 0.3–1.2)

## 2010-09-07 LAB — POCT CARDIAC MARKERS
Myoglobin, poc: 25.4 ng/mL (ref 12–200)
Myoglobin, poc: 31.5 ng/mL (ref 12–200)

## 2010-09-07 LAB — CBC
HCT: 31.9 % — ABNORMAL LOW (ref 36.0–46.0)
HCT: 34.2 % — ABNORMAL LOW (ref 36.0–46.0)
Hemoglobin: 11 g/dL — ABNORMAL LOW (ref 12.0–15.0)
MCHC: 35.8 g/dL (ref 30.0–36.0)
MCV: 101.5 fL — ABNORMAL HIGH (ref 78.0–100.0)
MCV: 101.5 fL — ABNORMAL HIGH (ref 78.0–100.0)
Platelets: 206 10*3/uL (ref 150–400)
Platelets: 198 10*3/uL (ref 150–400)
RBC: 3.1 MIL/uL — ABNORMAL LOW (ref 3.87–5.11)
RBC: 3.53 MIL/uL — ABNORMAL LOW (ref 3.87–5.11)
RDW: 13.2 % (ref 11.5–15.5)
RDW: 13.4 % (ref 11.5–15.5)
WBC: 5 10*3/uL (ref 4.0–10.5)
WBC: 6.3 10*3/uL (ref 4.0–10.5)
WBC: 7.8 10*3/uL (ref 4.0–10.5)

## 2010-09-07 LAB — HEPATIC FUNCTION PANEL
ALT: 13 U/L (ref 0–35)
Bilirubin, Direct: <0.1 mg/dL (ref 0.0–0.3)
Total Bilirubin: 0.5 mg/dL (ref 0.3–1.2)

## 2010-09-07 LAB — IRON AND TIBC
Saturation Ratios: 29 % (ref 20–55)
TIBC: 333 ug/dL (ref 250–470)

## 2010-09-07 LAB — HEPARIN LEVEL (UNFRACTIONATED): Heparin Unfractionated: <0.1 IU/mL — ABNORMAL LOW (ref 0.30–0.70)

## 2010-09-07 LAB — RETICULOCYTES: Retic Ct Pct: 2 % (ref 0.4–3.1)

## 2010-09-07 LAB — URINALYSIS, ROUTINE W REFLEX MICROSCOPIC
Bilirubin Urine: NEGATIVE
Glucose, UA: NEGATIVE mg/dL
Ketones, ur: NEGATIVE mg/dL
Nitrite: NEGATIVE
pH: 7.5 (ref 5.0–8.0)

## 2010-09-07 LAB — LIPID PANEL
Cholesterol: 143 mg/dL (ref 0–200)
LDL Cholesterol: 78 mg/dL (ref 0–99)

## 2010-09-07 LAB — CARDIAC PANEL(CRET KIN+CKTOT+MB+TROPI)
CK, MB: 0.4 ng/mL (ref 0.3–4.0)
Relative Index: INVALID (ref 0.0–2.5)
Troponin I: 0.01 ng/mL (ref 0.00–0.06)

## 2010-09-07 LAB — FOLATE: Folate: >20 ng/mL

## 2010-09-07 LAB — URINE CULTURE: Colony Count: 30000

## 2010-09-07 LAB — TSH: TSH: 4.358 u[IU]/mL (ref 0.350–4.500)

## 2010-09-07 LAB — PHOSPHORUS: Phosphorus: 3.9 mg/dL (ref 2.3–4.6)

## 2010-09-07 LAB — FERRITIN: Ferritin: 23 ng/mL (ref 10–291)

## 2010-09-07 LAB — CK TOTAL AND CKMB (NOT AT ARMC): Total CK: 32 U/L (ref 7–177)

## 2010-09-08 LAB — DIFFERENTIAL
Basophils Relative: 0 % (ref 0–1)
Eosinophils Absolute: 0.1 10*3/uL (ref 0.0–0.7)
Neutrophils Relative %: 36 % — ABNORMAL LOW (ref 43–77)

## 2010-09-08 LAB — POCT I-STAT, CHEM 8
Glucose, Bld: 88 mg/dL (ref 70–99)
HCT: 38 % (ref 36.0–46.0)
Hemoglobin: 12.9 g/dL (ref 12.0–15.0)
Potassium: 3.9 mEq/L (ref 3.5–5.1)
Sodium: 130 mEq/L — ABNORMAL LOW (ref 135–145)
TCO2: 30 mmol/L (ref 0–100)

## 2010-09-08 LAB — POCT CARDIAC MARKERS
CKMB, poc: <1 ng/mL — ABNORMAL LOW (ref 1.0–8.0)
Myoglobin, poc: 22.6 ng/mL (ref 12–200)

## 2010-09-08 LAB — CBC
MCHC: 34.6 g/dL (ref 30.0–36.0)
MCV: 102.5 fL — ABNORMAL HIGH (ref 78.0–100.0)
Platelets: 189 10*3/uL (ref 150–400)

## 2010-09-13 LAB — CBC
Hemoglobin: 11.9 g/dL — ABNORMAL LOW (ref 12.0–15.0)
MCHC: 34.4 g/dL (ref 30.0–36.0)
MCV: 102 fL — ABNORMAL HIGH (ref 78.0–100.0)
RDW: 13.7 % (ref 11.5–15.5)

## 2010-09-13 LAB — DIFFERENTIAL
Basophils Absolute: 0 10*3/uL (ref 0.0–0.1)
Basophils Relative: 1 % (ref 0–1)
Eosinophils Absolute: 0 10*3/uL (ref 0.0–0.7)
Monocytes Absolute: 0.5 10*3/uL (ref 0.1–1.0)
Neutro Abs: 1.7 10*3/uL (ref 1.7–7.7)

## 2010-09-13 LAB — POCT I-STAT, CHEM 8
Calcium, Ion: 1.23 mmol/L (ref 1.12–1.32)
Chloride: 98 mEq/L (ref 96–112)
Glucose, Bld: 92 mg/dL (ref 70–99)
HCT: 38 % (ref 36.0–46.0)
Hemoglobin: 12.9 g/dL (ref 12.0–15.0)
TCO2: 30 mmol/L (ref 0–100)

## 2010-09-13 LAB — POCT CARDIAC MARKERS: Troponin i, poc: <0.05 ng/mL (ref 0.00–0.09)

## 2010-09-16 ENCOUNTER — Inpatient Hospital Stay (HOSPITAL_COMMUNITY): Payer: Medicare Other

## 2010-09-16 ENCOUNTER — Emergency Department (HOSPITAL_COMMUNITY): Payer: Medicare Other

## 2010-09-16 ENCOUNTER — Observation Stay (HOSPITAL_COMMUNITY)
Admission: EM | Admit: 2010-09-16 | Discharge: 2010-09-20 | DRG: 999 | Disposition: A | Discharge status: Home / Self Care | Payer: Medicare Other | Attending: Internal Medicine | Admitting: Internal Medicine

## 2010-09-16 DIAGNOSIS — I10 Essential (primary) hypertension: Secondary | ICD-10-CM | POA: Insufficient documentation

## 2010-09-16 DIAGNOSIS — R55 Syncope and collapse: Principal | ICD-10-CM | POA: Insufficient documentation

## 2010-09-16 DIAGNOSIS — W19XXXA Unspecified fall, initial encounter: Secondary | ICD-10-CM | POA: Insufficient documentation

## 2010-09-16 DIAGNOSIS — F259 Schizoaffective disorder, unspecified: Secondary | ICD-10-CM | POA: Insufficient documentation

## 2010-09-16 DIAGNOSIS — I509 Heart failure, unspecified: Secondary | ICD-10-CM | POA: Insufficient documentation

## 2010-09-16 DIAGNOSIS — E039 Hypothyroidism, unspecified: Secondary | ICD-10-CM | POA: Insufficient documentation

## 2010-09-16 DIAGNOSIS — G4733 Obstructive sleep apnea (adult) (pediatric): Secondary | ICD-10-CM | POA: Insufficient documentation

## 2010-09-16 DIAGNOSIS — R0989 Other specified symptoms and signs involving the circulatory and respiratory systems: Secondary | ICD-10-CM | POA: Insufficient documentation

## 2010-09-16 DIAGNOSIS — I5022 Chronic systolic (congestive) heart failure: Secondary | ICD-10-CM | POA: Insufficient documentation

## 2010-09-16 DIAGNOSIS — R071 Chest pain on breathing: Secondary | ICD-10-CM | POA: Insufficient documentation

## 2010-09-16 DIAGNOSIS — R4182 Altered mental status, unspecified: Secondary | ICD-10-CM | POA: Insufficient documentation

## 2010-09-16 DIAGNOSIS — R0609 Other forms of dyspnea: Secondary | ICD-10-CM | POA: Insufficient documentation

## 2010-09-16 DIAGNOSIS — E236 Other disorders of pituitary gland: Secondary | ICD-10-CM | POA: Insufficient documentation

## 2010-09-16 DIAGNOSIS — R51 Headache: Secondary | ICD-10-CM | POA: Insufficient documentation

## 2010-09-16 DIAGNOSIS — F319 Bipolar disorder, unspecified: Secondary | ICD-10-CM | POA: Insufficient documentation

## 2010-09-16 LAB — DIFFERENTIAL
Eosinophils Absolute: 0 10*3/uL (ref 0.0–0.7)
Eosinophils Relative: 1 % (ref 0–5)
Lymphocytes Relative: 34 % (ref 12–46)
Lymphs Abs: 2.4 10*3/uL (ref 0.7–4.0)
Monocytes Absolute: 0.7 10*3/uL (ref 0.1–1.0)
Monocytes Relative: 10 % (ref 3–12)

## 2010-09-16 LAB — POCT I-STAT, CHEM 8
Calcium, Ion: 1.23 mmol/L (ref 1.12–1.32)
Chloride: 95 mEq/L — ABNORMAL LOW (ref 96–112)
Glucose, Bld: 110 mg/dL — ABNORMAL HIGH (ref 70–99)
HCT: 36 % (ref 36.0–46.0)
TCO2: 31 mmol/L (ref 0–100)

## 2010-09-16 LAB — URINALYSIS, MICROSCOPIC ONLY
Glucose, UA: NEGATIVE mg/dL
Hgb urine dipstick: NEGATIVE
Ketones, ur: NEGATIVE mg/dL
Protein, ur: NEGATIVE mg/dL
Urobilinogen, UA: 0.2 mg/dL (ref 0.0–1.0)

## 2010-09-16 LAB — CBC
HCT: 33.1 % — ABNORMAL LOW (ref 36.0–46.0)
MCH: 33 pg (ref 26.0–34.0)
MCHC: 34.4 g/dL (ref 30.0–36.0)
MCV: 95.9 fL (ref 78.0–100.0)
Platelets: 190 10*3/uL (ref 150–400)
RDW: 13.4 % (ref 11.5–15.5)

## 2010-09-16 LAB — HEPATIC FUNCTION PANEL
AST: 24 U/L (ref 0–37)
Albumin: 3.1 g/dL — ABNORMAL LOW (ref 3.5–5.2)
Alkaline Phosphatase: 36 U/L — ABNORMAL LOW (ref 39–117)
Bilirubin, Direct: 0.5 mg/dL — ABNORMAL HIGH (ref 0.0–0.3)
Total Bilirubin: 0.8 mg/dL (ref 0.3–1.2)

## 2010-09-16 LAB — D-DIMER, QUANTITATIVE: D-Dimer, Quant: 0.45 ug/mL-FEU (ref 0.00–0.48)

## 2010-09-16 LAB — CARDIAC PANEL(CRET KIN+CKTOT+MB+TROPI)
CK, MB: 0.4 ng/mL (ref 0.3–4.0)
Relative Index: INVALID (ref 0.0–2.5)
Total CK: 18 U/L (ref 7–177)

## 2010-09-16 LAB — VALPROIC ACID LEVEL: Valproic Acid Lvl: 77 ug/mL (ref 50.0–100.0)

## 2010-09-16 NOTE — Progress Notes (Signed)
Summary: [facility] Triage and admission to [facility]  Phone Note Outgoing Call   Summary of Call: Pt. called [facility] over the weekend- see triage report.  Admitted for observation, see ED notes on your desk - unsure if still inpatient.  Left message on answering machine for pt. to return call.  Initial call taken by: Dutch Quint RN,  August 30, 2010 5:43 PM  Follow-up for Phone Call        Discharged with noncardiac chest pain Follow-up by: Julieanne Manson MD,  September 09, 2010 5:58 PM

## 2010-09-17 ENCOUNTER — Inpatient Hospital Stay (HOSPITAL_COMMUNITY): Payer: Medicare Other

## 2010-09-17 DIAGNOSIS — R55 Syncope and collapse: Secondary | ICD-10-CM

## 2010-09-17 LAB — CBC
MCH: 32.7 pg (ref 26.0–34.0)
MCHC: 34 g/dL (ref 30.0–36.0)
Platelets: 187 10*3/uL (ref 150–400)
RDW: 13.7 % (ref 11.5–15.5)

## 2010-09-17 LAB — CARDIAC PANEL(CRET KIN+CKTOT+MB+TROPI)
CK, MB: 0.4 ng/mL (ref 0.3–4.0)
CK, MB: 0.4 ng/mL (ref 0.3–4.0)
Relative Index: INVALID (ref 0.0–2.5)
Total CK: 21 U/L (ref 7–177)
Troponin I: <0.01 ng/mL (ref 0.00–0.06)
Troponin I: <0.01 ng/mL (ref 0.00–0.06)

## 2010-09-17 LAB — BASIC METABOLIC PANEL
Calcium: 9.2 mg/dL (ref 8.4–10.5)
Creatinine, Ser: 0.83 mg/dL (ref 0.4–1.2)
GFR calc Af Amer: >60 mL/min (ref >60)
GFR calc non Af Amer: >60 mL/min (ref >60)
Sodium: 132 mEq/L — ABNORMAL LOW (ref 135–145)

## 2010-09-18 LAB — BASIC METABOLIC PANEL
BUN: 6 mg/dL (ref 6–23)
Chloride: 92 mEq/L — ABNORMAL LOW (ref 96–112)
GFR calc non Af Amer: 54 mL/min — ABNORMAL LOW (ref >60)
Glucose, Bld: 109 mg/dL — ABNORMAL HIGH (ref 70–99)
Potassium: 4.3 mEq/L (ref 3.5–5.1)

## 2010-09-18 LAB — CBC
HCT: 34.1 % — ABNORMAL LOW (ref 36.0–46.0)
MCH: 32.7 pg (ref 26.0–34.0)
MCV: 97.7 fL (ref 78.0–100.0)
RDW: 13.8 % (ref 11.5–15.5)
WBC: 5.8 10*3/uL (ref 4.0–10.5)

## 2010-09-19 DIAGNOSIS — F200 Paranoid schizophrenia: Secondary | ICD-10-CM

## 2010-09-19 LAB — CBC
HCT: 34.4 % — ABNORMAL LOW (ref 36.0–46.0)
Hemoglobin: 11.1 g/dL — ABNORMAL LOW (ref 12.0–15.0)
MCH: 32.2 pg (ref 26.0–34.0)
MCHC: 32.3 g/dL (ref 30.0–36.0)

## 2010-09-19 LAB — BASIC METABOLIC PANEL
CO2: 28 mEq/L (ref 19–32)
Calcium: 9.2 mg/dL (ref 8.4–10.5)
Creatinine, Ser: 2.22 mg/dL — ABNORMAL HIGH (ref 0.4–1.2)
Glucose, Bld: 113 mg/dL — ABNORMAL HIGH (ref 70–99)

## 2010-09-20 LAB — BASIC METABOLIC PANEL
BUN: 10 mg/dL (ref 6–23)
CO2: 28 mEq/L (ref 19–32)
Calcium: 8.9 mg/dL (ref 8.4–10.5)
Creatinine, Ser: 1.03 mg/dL (ref 0.4–1.2)
Creatinine, Ser: 0.98 mg/dL (ref 0.4–1.2)
GFR calc Af Amer: >60 mL/min (ref >60)
GFR calc non Af Amer: 55 mL/min — ABNORMAL LOW (ref >60)

## 2010-09-20 LAB — CBC
MCH: 32.5 pg (ref 26.0–34.0)
MCV: 97.5 fL (ref 78.0–100.0)
Platelets: 153 10*3/uL (ref 150–400)
RDW: 13.2 % (ref 11.5–15.5)

## 2010-09-20 NOTE — Consult Note (Signed)
NAME:  [name], [name]               ACCOUNT NO.:  [id number]  MEDICAL RECORD NO.:  [id number]           PATIENT TYPE:  I  LOCATION:  4702                         FACILITY:  [facility]  PHYSICIAN:  Eulogio Ditch, MD DATE OF BIRTH:  25-Jul-1950  DATE OF CONSULTATION:  09/19/2010 DATE OF DISCHARGE:                                CONSULTATION   REASON FOR CONSULTATION:  History of bipolar disorder and passing out.  HISTORY OF PRESENT ILLNESS:  A 60 year old female with a history of schizoaffective disorder who was followed in the outpatient setting by Dr. Manson Passey, the psychiatrist.  CURRENT PSYCH MEDICATIONS: 1. Seroquel 600 mg p.o. daily at bedtime. 2. Trazodone 200 mg at bedtime. 3. Ambien 5 mg at bedtime. 4. [name] 6 mg at bedtime. 5. Depakote ER 1000 mg at bedtime.  The patient is currently very logical and goal directed.  Denies hearing any voices, is not paranoid.  Denies any suicidal ideations.  The patient told me that she went into the bathroom and she slipped and fell.  That is why she ended up in the hospital.  The patient lives with her fiance.  The patient told me that she is very compliant with medication and take her medications regularly and follows with Dr. Manson Passey regularly.  PAST MEDICAL HISTORY: 1. History of hypothyroidism. 2. Morbidly obese. 3. Obstructive sleep apnea with hypoventilation syndrome. 4. Asthma. 5. Coronary artery disease. 6. Systolic congestive heart failure.  ALLERGIES:  The patient is allergic to PENICILLIN and SULFA, both cause hives.  SUBSTANCE ABUSE HISTORY:  None reported.  MENTAL STATUS EXAM:  The patient is calm and cooperative during the interview.  Fair eye contact.  Pleasant on approach.  No psychomotor agitation or retardation noted during the interview.  Hygiene and grooming fair.  Speech soft and slow but normal in rate and rhythm. Thought process logical and goal directed.  Thought content, not suicidal or homicidal, not  delusional.  Thought perception, no audiovisual hallucination reported or internally preoccupied. Cognition, alert, awake, oriented x3.  Memory, immediate, recent, and remote fair.  Attention and concentration fair.  Abstraction ability fair.  Insight and judgment intact.  DIAGNOSES:  AXIS I:  As per history of schizoaffective disorder. AXIS II:  Deferred. AXIS III:  See medical notes. AXIS IV:  Chronic mental and medical issues. AXIS V:  50.  RECOMMENDATIONS: 1. The patient's Seroquel can be discontinued at this time along with     Cogentin. 2. I told the patient she should continue [name] and Depakote.  Her     Depakote level is within normal limits at 77.0.  The patient agrees     with this treatment plan.  I also told the patient that she should     follow up as soon as possible after discharge from the hospital     with Dr. Manson Passey.  When the patient is discharged, social worker should call Dr. Theora Gianotti office and report about patient's admission in the hospital.  Thanks for involving me in taking care of this patient.     Eulogio Ditch, MD     SA/MEDQ  D:  09/19/2010  T:  09/19/2010  Job:  [id number]  Electronically Signed by Eulogio Ditch  on 09/20/2010 02:45:39 PM

## 2010-09-21 LAB — CBC
HCT: 38.1 % (ref 36.0–46.0)
Hemoglobin: 12.8 g/dL (ref 12.0–15.0)
MCV: 100.6 fL — ABNORMAL HIGH (ref 78.0–100.0)
Platelets: 150 10*3/uL (ref 150–400)
RBC: 3.79 MIL/uL — ABNORMAL LOW (ref 3.87–5.11)
WBC: 5.6 10*3/uL (ref 4.0–10.5)

## 2010-09-21 LAB — POCT I-STAT, CHEM 8
Calcium, Ion: 1.14 mmol/L (ref 1.12–1.32)
Creatinine, Ser: <0.2 mg/dL — ABNORMAL LOW (ref 0.4–1.2)
Glucose, Bld: 98 mg/dL (ref 70–99)
Hemoglobin: 14.3 g/dL (ref 12.0–15.0)
TCO2: 26 mmol/L (ref 0–100)

## 2010-09-21 LAB — URINALYSIS, ROUTINE W REFLEX MICROSCOPIC
Ketones, ur: NEGATIVE mg/dL
Protein, ur: NEGATIVE mg/dL
Urobilinogen, UA: 1 mg/dL (ref 0.0–1.0)

## 2010-09-21 LAB — DIFFERENTIAL
Eosinophils Absolute: 0 10*3/uL (ref 0.0–0.7)
Eosinophils Relative: 1 % (ref 0–5)
Lymphs Abs: 2.8 10*3/uL (ref 0.7–4.0)
Monocytes Absolute: 0.5 10*3/uL (ref 0.1–1.0)
Monocytes Relative: 9 % (ref 3–12)

## 2010-09-21 LAB — URINE MICROSCOPIC-ADD ON

## 2010-09-22 LAB — DIFFERENTIAL
Eosinophils Absolute: 0 10*3/uL (ref 0.0–0.7)
Eosinophils Relative: 1 % (ref 0–5)
Lymphocytes Relative: 44 % (ref 12–46)
Lymphs Abs: 2.1 10*3/uL (ref 0.7–4.0)
Monocytes Relative: 10 % (ref 3–12)
Neutrophils Relative %: 45 % (ref 43–77)

## 2010-09-22 LAB — CBC
MCHC: 34.5 g/dL (ref 30.0–36.0)
MCV: 101.5 fL — ABNORMAL HIGH (ref 78.0–100.0)
RDW: 13.6 % (ref 11.5–15.5)

## 2010-09-22 LAB — POCT CARDIAC MARKERS
Myoglobin, poc: 27.6 ng/mL (ref 12–200)
Troponin i, poc: <0.05 ng/mL (ref 0.00–0.09)

## 2010-09-22 LAB — COMPREHENSIVE METABOLIC PANEL
ALT: 15 U/L (ref 0–35)
AST: 15 U/L (ref 0–37)
Calcium: 9.3 mg/dL (ref 8.4–10.5)
Creatinine, Ser: 0.68 mg/dL (ref 0.4–1.2)
GFR calc Af Amer: >60 mL/min (ref >60)
Sodium: 136 mEq/L (ref 135–145)
Total Protein: 6.9 g/dL (ref 6.0–8.3)

## 2010-09-22 NOTE — Discharge Summary (Signed)
NAME:  [name], [name]               ACCOUNT NO.:  [id number]  MEDICAL RECORD NO.:  [id number]           PATIENT TYPE:  I  LOCATION:  4702                         FACILITY:  [facility]  PHYSICIAN:  Thad Ranger, MD       DATE OF BIRTH:  [birth year]  DATE OF ADMISSION:  09/16/2010 DATE OF DISCHARGE:                        DISCHARGE SUMMARY - REFERRING   PRIMARY CARE PHYSICIAN:  Marcene Duos, MD  DISCHARGE DIAGNOSES: 1. Syncope likely vasovagal versus secondary to medication effect. 2. Pleuritic chest pain, resolved. 3. Altered mental status, probably secondary to multiple psych     medications, improved. 4. History of schizoaffective disorder with bipolar disorder. 5. 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 6. Obstructive sleep apnea. 7. Hypertension. 8. Hypothyroidism.  CONSULTATIONS:  Psychiatry, Dr. Eulogio Ditch.  DISCHARGE MEDICATIONS: 1. Percocet 1 tablet every 6 hours as needed for pain. 2. Synthroid 112 mcg p.o. q.a.m. 3. Advair Diskus 100/50 one puff inhaled b.i.d. 4. Amlodipine 5 mg p.o. daily. 5. Aspirin 325 mg p.o. daily. 6. Depakote 1000 mg p.o. daily at bedtime. 7. Fluticasone 50 mcg 2 sprays to each nostril every morning. 8. Folic acid 1 mg p.o. q.a.m. 9. [name] 6 mg p.o. daily at bedtime. 10.Loratadine 10 mg daily. 11.Metoprolol 50 mg p.o. b.i.d. 12.Nabumetone 500 mg every 12 hours as needed for pain. 13.Nitroglycerin 0.4 mg sublingual every 5 minutes as needed. 14.Omeprazole 20 mg p.o. b.i.d. 15.Trazodone 100 mg 2 tablets p.o. daily at bedtime. 16.Zolpidem 5 mg p.o. daily at bedtime.  The patient was counseled strongly to stop taking the following medication which includes, 1. Seroquel 600 mg daily at bedtime. 2. Lisinopril. 3. Benztropine.  BRIEF HISTORY OF PRESENT ILLNESS:  Ms. Schnieders is a 60 year old female, morbidly obese with a history of  schizoaffective disorder, bipolar disorder was brought to the emergency room after she reportedly passed out four times.  The patient stated that she woke up at 6:00 a.m. in the morning and went to the bathroom and fell over backward.  For details, please refer to admission note dictated by Dr. Hartley Barefoot.  RADIOLOGICAL DATA:  CT of head without contrast on September 17, 2010, no acute intracranial abnormalities, partial opacification of the right frontal sinus and right mastoid air cells, question sinusitis or mastoiditis.  Carotid Doppler on September 17, 2010, showed very mild carotid block without any ICA stenosis.  A 2-D echocardiogram was done during the hospitalization, the results of which are still pending at the time of my dictation.  EEG was also done on September 17, 2010, which was essentially normal EEG with no epileptiform images.  BRIEF HOSPITALIZATION COURSE: 1. Syncope, likely vasovagal versus medication effect.  The patient     was admitted to the tele monitor floor and ruled out for any acute     arrhythmias.  Neuro exam was nonfocal, however, CT head was     obtained which was negative as well as EEG, which did not show any     acute seizure focus.  D-dimer was also checked for pleuritic chest  pain, which was essentially unremarkable.  During the     hospitalization, the patient was noted to be somewhat drowsy and it     was felt that the patient was on too many psych medications on very     high doses.  Psychiatry was consulted and Seroquel and Cogentin     were discontinued per Dr. Alvie Heidelberg consultation.  The patient     needs to follow up with Dr. Manson Passey, her psychiatrist and make follow     up appointment within next 1 week. 2. Hyponatremia, likely secondary to combination of hypovolemia and     SIADH as well as ACE inhibitors.  Serum osmolarity was 273,     however, the urine osmolarity was 382.  Initially, the patient was     placed on fluid restriction which  actually drove the sodium further     down, hence the fluid restriction was discontinued and the patient     was gently hydrated which improved her sodium to 126.  The patient     has no altered mental status.  In fact, her mental status has     significantly improved from the time of admission.  She is pleasant     and cooperative, ready to go home.  I did strictly counsel her to     get a BMET level within next 1 week and follow up with her primary     care physician.  She also needs to follow up with her cardiologist,     Dr. Sharyn Lull to follow up on the 2-D echo.  PHYSICAL EXAMINATION:  VITAL SIGNS:  At the time of discharge, temperature 98.7, pulse 92, respirations 15, blood pressure 102/68, and O2 sats 98% on room air. GENERAL:  The patient is alert, awake and oriented, not in acute distress. HEENT:  Anicteric sclerae.  Pink conjunctivae.  Pupils are reactive to light and accommodation.  EOMI. NECK:  Supple.  No lymphadenopathy.  No JVD. CVS:  S1 and S2 clear, regular rate and rhythm. CHEST:  Clear to auscultation bilaterally. ABDOMEN:  Soft, nontender, nondistended.  Normal bowel sounds. EXTREMITIES:  Obese.  No cyanosis or clubbing.  DISCHARGE INSTRUCTIONS:  Follow up with Dr. Delrae Alfred within 2 weeks, Dr. Manson Passey, Psychiatry within of 1-2 weeks and Dr. Sharyn Lull within next 1-2 weeks, Cardiology.  Discharge time 35 minutes.     Thad Ranger, MD     RR/MEDQ  D:  09/20/2010  T:  09/20/2010  Job:  [id number]  cc:   Marcene Duos, M.D. Dr. Ina Kick. Sharyn Lull, M.D.  Electronically Signed by Andres Labrum Rolinda Impson  on 09/22/2010 04:46:02 PM

## 2010-09-23 NOTE — Discharge Summary (Signed)
NAME:  [name], [name]               ACCOUNT NO.:  [id number]  MEDICAL RECORD NO.:  [id number]           PATIENT TYPE:  I  LOCATION:  2037                         FACILITY:  [facility]  PHYSICIAN:  Eduardo Osier. Sharyn Lull, M.D. DATE OF BIRTH:  [birth year]  DATE OF ADMISSION:  08/29/2010 DATE OF DISCHARGE:  08/31/2010                              DISCHARGE SUMMARY   ADMITTING DIAGNOSES: 1. Atypical chest pain. 2. Abdominal pain. 3. Rule out myocardial infarction. 4. Urinary tract infection.  FINAL DIAGNOSES: 1. Status post atypical chest pain, myocardial infarction ruled out. 2. Status post abdominal pain, rule out gastroesophageal reflux     disease, rule out peptic ulcer disease, resolving. 3. Urinary tract infection. 4. Hypothyroidism. 5. Morbid obesity. 6. Obstructive sleep apnea. 7. Obesity hypoventilation syndrome. 8. Hypertension. 9. Schizoaffective disorder. 10.History of bipolar disorder. 11.Mild coronary artery disease by catheterization in the past.  DISCHARGE HOME MEDICATIONS: 1. Ciprofloxacin 500 mg 1 tablet twice daily. 2. Advair Diskus 100/50 one puff twice daily. 3. Amlodipine 5 mg 1 tablet daily. 4. Enteric-coated aspirin 325 mg 1 tablet daily. 5. Benztropine 0.5 mg 1 tablet by mouth twice daily. 6. Depakote 500 mg 2 tablets by mouth at bedtime. 7. Fluticasone nasal spray 50 mcg 2 sprays every morning as needed. 8. Folic acid 1 mg every morning. 9. [name] 1 tablet by mouth every morning, 6 mg. 10.Lisinopril 40 mg 1 tablet twice daily. 11.Loratadine 10 mg 1 tablet daily as needed. 12.Metoprolol 50 mg 1 tablet twice daily. 13.Nabumetone 500 mg 1 tablet every 12 hours as needed for pain. 14.Nitrostat 0.4 mg sublingual use as directed. 15.Crestor 20 mg 1 capsule twice daily. 16.Oxycodone/acetaminophen 5/325 one tablet every 6 hours as needed     for pain. 17.Seroquel 300 mg 2 tablets daily at bedtimes. 18.Synthroid 100 mcg 1 tablet every morning. 19.Trazodone 100 mg  2 tablets daily at night as before. 20.Ambien 5 mg 1 tablet daily at bedtime as needed.  DIET:  Low salt, low cholesterol.  The patient has been advised to reduce weight.  CONDITION AT DISCHARGE:  Stable.  FOLLOWUP:  Follow up with me in 1 week.  Follow up with Psych and PMD as scheduled.  BRIEF HISTORY AND HOSPITAL COURSE:  [name] is a 60 year old white female with past medical history significant for multiple medical problems, i.e., mild coronary artery disease, hypertension, hypercholesteremia, morbid obesity, obstructive sleep apnea, history of tobacco abuse in the past, morbid obesity, history of schizoaffective disorder, and history of bipolar disorder who was admitted by Dr. Algie Coffer because of a 2-day history of abdominal pain radiating to the chest.  Cardiac workup was negative in the ER and was noted to have UTI with urinalysis.  The patient denies any nausea, vomiting, shortness of breath, or burning in urination.  PAST MEDICAL HISTORY:  As above.  FAMILY HISTORY:  Positive for coronary artery disease and lung cancer.  SOCIAL HISTORY:  She quit smoking many years ago.  No history of alcohol or drug abuse.  ALLERGIES:  She is allergic to PENICILLIN and SULFA.  MEDICATIONS AT HOME:  Same as discharge home medications except ciprofloxacin.  PHYSICAL EXAMINATION:  GENERAL:  She was alert, awake, and oriented x3. VITAL SIGNS:  Blood pressure was 114/76.  Pulse was 74 and regular.  She was afebrile. HEENT:  Conjunctivae were pink. NECK:  Supple.  Sclerae are nonicteric. NECK:  No JVD. LUNGS:  Clear to auscultation without rhonchi or rales. CARDIOVASCULAR:  S1 and S2 was normal. ABDOMEN:  Soft, distended, and obese. EXTREMITIES:  There was no clubbing or cyanosis.  There was trace edema. NEURO:  Grossly intact.  LABORATORY DATA:  Her hemoglobin was 11.6, hematocrit 34.5, and white count of 6.0.  Sodium was 136, potassium 4.0, bicarb 32, glucose 104, BUN 2, and  creatinine 0.90.  Her lipase was 24 which was in normal range.  Her cardiac enzymes were normal.  Point-of-care markers were normal.  Urinalysis showed many bacteria and large number of wbc's. Urine culture is still pending.  BRIEF HOSPITAL COURSE:  The patient was admitted to Telemetry Unit.  The patient did not have any episodes of chest pain during the hospital stay.  Her abdominal pain was resolved with proton pump inhibitors.  The patient was started on p.o. Cipro with improvement in her urinary symptoms.  The patient did not have dysuria, but did complain of frequency of urination.  The patient remained afebrile during the hospital stay.  The patient did not have any further episodes of abdominal or chest pain during the hospital stay.  The patient will be discharged home on above medications and will be followed up in my office in 1 week and with Psych and her PMD as scheduled.     Eduardo Osier. Sharyn Lull, M.D.     [facility]/MEDQ  D:  08/31/2010  T:  09/01/2010  Job:  [id number]  Electronically Signed by Rinaldo Cloud M.D. on 09/23/2010 10:29:53 PM

## 2010-09-24 LAB — POCT I-STAT, CHEM 8
BUN: <3 mg/dL — ABNORMAL LOW (ref 6–23)
Calcium, Ion: 1.17 mmol/L (ref 1.12–1.32)
Chloride: 100 meq/L (ref 96–112)
Creatinine, Ser: 0.5 mg/dL (ref 0.4–1.2)
Glucose, Bld: 97 mg/dL (ref 70–99)
HCT: 42 % (ref 36.0–46.0)
Hemoglobin: 14.3 g/dL (ref 12.0–15.0)
Potassium: 3.8 mEq/L (ref 3.5–5.1)
Sodium: 136 mEq/L (ref 135–145)
TCO2: 29 mmol/L (ref 0–100)

## 2010-09-24 LAB — D-DIMER, QUANTITATIVE: D-Dimer, Quant: 0.38 ug/mL-FEU (ref 0.00–0.48)

## 2010-09-24 LAB — BRAIN NATRIURETIC PEPTIDE: Pro B Natriuretic peptide (BNP): <30 pg/mL (ref 0.0–100.0)

## 2010-09-24 LAB — POCT CARDIAC MARKERS
CKMB, poc: <1 ng/mL — ABNORMAL LOW (ref 1.0–8.0)
Myoglobin, poc: 50.5 ng/mL (ref 12–200)
Troponin i, poc: <0.05 ng/mL (ref 0.00–0.09)

## 2010-09-26 LAB — CBC
HCT: 37.5 % (ref 36.0–46.0)
Hemoglobin: 12.7 g/dL (ref 12.0–15.0)
MCHC: 33.9 g/dL (ref 30.0–36.0)
MCV: 102.1 fL — ABNORMAL HIGH (ref 78.0–100.0)
RBC: 3.67 MIL/uL — ABNORMAL LOW (ref 3.87–5.11)
RDW: 14.4 % (ref 11.5–15.5)

## 2010-09-26 LAB — DIFFERENTIAL
Basophils Absolute: 0.1 K/uL (ref 0.0–0.1)
Basophils Relative: 1 % (ref 0–1)
Eosinophils Absolute: 0.1 K/uL (ref 0.0–0.7)
Eosinophils Relative: 1 % (ref 0–5)
Lymphocytes Relative: 30 % (ref 12–46)
Lymphs Abs: 2.2 K/uL (ref 0.7–4.0)
Monocytes Absolute: 0.6 K/uL (ref 0.1–1.0)
Monocytes Relative: 8 % (ref 3–12)
Neutro Abs: 4.5 K/uL (ref 1.7–7.7)
Neutrophils Relative %: 60 % (ref 43–77)

## 2010-09-26 LAB — URINALYSIS, ROUTINE W REFLEX MICROSCOPIC
Bilirubin Urine: NEGATIVE
Glucose, UA: NEGATIVE mg/dL
Hgb urine dipstick: NEGATIVE
Ketones, ur: NEGATIVE mg/dL
Nitrite: NEGATIVE
Protein, ur: NEGATIVE mg/dL
Specific Gravity, Urine: 1.007 (ref 1.005–1.030)
Urobilinogen, UA: 0.2 mg/dL (ref 0.0–1.0)
pH: 7 (ref 5.0–8.0)

## 2010-09-26 LAB — BASIC METABOLIC PANEL WITH GFR
BUN: 6 mg/dL (ref 6–23)
CO2: 30 meq/L (ref 19–32)
Calcium: 9.5 mg/dL (ref 8.4–10.5)
Chloride: 101 meq/L (ref 96–112)
Creatinine, Ser: 0.71 mg/dL (ref 0.4–1.2)
GFR calc non Af Amer: >60 mL/min
Glucose, Bld: 97 mg/dL (ref 70–99)
Potassium: 3.9 meq/L (ref 3.5–5.1)
Sodium: 138 meq/L (ref 135–145)

## 2010-09-26 LAB — POCT CARDIAC MARKERS: CKMB, poc: <1 ng/mL — ABNORMAL LOW (ref 1.0–8.0)

## 2010-09-26 LAB — GLUCOSE, CAPILLARY: Glucose-Capillary: 91 mg/dL (ref 70–99)

## 2010-09-27 LAB — BASIC METABOLIC PANEL
BUN: 5 mg/dL — ABNORMAL LOW (ref 6–23)
Chloride: 102 mEq/L (ref 96–112)
GFR calc Af Amer: >60 mL/min (ref >60)
GFR calc non Af Amer: >60 mL/min (ref >60)
Potassium: 3.5 mEq/L (ref 3.5–5.1)

## 2010-09-27 LAB — DIFFERENTIAL
Lymphocytes Relative: 41 % (ref 12–46)
Lymphs Abs: 2.1 10*3/uL (ref 0.7–4.0)
Monocytes Relative: 8 % (ref 3–12)
Neutrophils Relative %: 48 % (ref 43–77)

## 2010-09-27 LAB — POCT CARDIAC MARKERS
Myoglobin, poc: 26.2 ng/mL (ref 12–200)
Troponin i, poc: <0.05 ng/mL (ref 0.00–0.09)

## 2010-09-27 LAB — CBC
HCT: 35.5 % — ABNORMAL LOW (ref 36.0–46.0)
Platelets: 186 10*3/uL (ref 150–400)
RBC: 3.5 MIL/uL — ABNORMAL LOW (ref 3.87–5.11)
WBC: 5.2 10*3/uL (ref 4.0–10.5)

## 2010-09-28 LAB — BASIC METABOLIC PANEL
Calcium: 9.7 mg/dL (ref 8.4–10.5)
Creatinine, Ser: 0.53 mg/dL (ref 0.4–1.2)
GFR calc Af Amer: >60 mL/min (ref >60)
GFR calc non Af Amer: >60 mL/min (ref >60)
Sodium: 136 mEq/L (ref 135–145)

## 2010-09-28 LAB — CBC
Hemoglobin: 12 g/dL (ref 12.0–15.0)
RBC: 3.47 MIL/uL — ABNORMAL LOW (ref 3.87–5.11)
WBC: 5.8 10*3/uL (ref 4.0–10.5)

## 2010-09-28 LAB — URINALYSIS, ROUTINE W REFLEX MICROSCOPIC
Glucose, UA: NEGATIVE mg/dL
Protein, ur: NEGATIVE mg/dL
Specific Gravity, Urine: 1.005 (ref 1.005–1.030)
pH: 7 (ref 5.0–8.0)

## 2010-09-28 LAB — HEMOCCULT GUIAC POC 1CARD (OFFICE): Fecal Occult Bld: NEGATIVE

## 2010-09-29 LAB — DIFFERENTIAL
Eosinophils Absolute: 0 10*3/uL (ref 0.0–0.7)
Eosinophils Relative: 1 % (ref 0–5)
Lymphs Abs: 2.2 10*3/uL (ref 0.7–4.0)
Monocytes Relative: 11 % (ref 3–12)

## 2010-09-29 LAB — POCT CARDIAC MARKERS
Myoglobin, poc: 29.6 ng/mL (ref 12–200)
Troponin i, poc: <0.05 ng/mL (ref 0.00–0.09)

## 2010-09-29 LAB — CBC
MCHC: 34.3 g/dL (ref 30.0–36.0)
RBC: 3.64 MIL/uL — ABNORMAL LOW (ref 3.87–5.11)
WBC: 5.5 10*3/uL (ref 4.0–10.5)

## 2010-09-29 LAB — COMPREHENSIVE METABOLIC PANEL
ALT: 14 U/L (ref 0–35)
AST: 15 U/L (ref 0–37)
CO2: 31 mEq/L (ref 19–32)
Calcium: 9.1 mg/dL (ref 8.4–10.5)
GFR calc Af Amer: >60 mL/min (ref >60)
GFR calc non Af Amer: >60 mL/min (ref >60)
Potassium: 4.2 mEq/L (ref 3.5–5.1)
Sodium: 135 mEq/L (ref 135–145)

## 2010-09-29 LAB — URINALYSIS, ROUTINE W REFLEX MICROSCOPIC
Ketones, ur: NEGATIVE mg/dL
Nitrite: NEGATIVE
Protein, ur: NEGATIVE mg/dL

## 2010-09-29 LAB — HEMOCCULT GUIAC POC 1CARD (OFFICE): Fecal Occult Bld: NEGATIVE

## 2010-09-30 LAB — CBC
Hemoglobin: 12.8 g/dL (ref 12.0–15.0)
RBC: 3.65 MIL/uL — ABNORMAL LOW (ref 3.87–5.11)

## 2010-09-30 LAB — URINALYSIS, ROUTINE W REFLEX MICROSCOPIC
Glucose, UA: NEGATIVE mg/dL
Hgb urine dipstick: NEGATIVE
Specific Gravity, Urine: 1.005 (ref 1.005–1.030)

## 2010-09-30 LAB — COMPREHENSIVE METABOLIC PANEL
ALT: 20 U/L (ref 0–35)
Alkaline Phosphatase: 54 U/L (ref 39–117)
CO2: 30 mEq/L (ref 19–32)
GFR calc non Af Amer: >60 mL/min (ref >60)
Glucose, Bld: 111 mg/dL — ABNORMAL HIGH (ref 70–99)
Potassium: 3.9 mEq/L (ref 3.5–5.1)
Sodium: 139 mEq/L (ref 135–145)
Total Protein: 6.7 g/dL (ref 6.0–8.3)

## 2010-09-30 LAB — DIFFERENTIAL
Basophils Relative: 1 % (ref 0–1)
Eosinophils Absolute: 0 10*3/uL (ref 0.0–0.7)
Monocytes Relative: 12 % (ref 3–12)
Neutrophils Relative %: 39 % — ABNORMAL LOW (ref 43–77)

## 2010-09-30 LAB — ETHANOL: Alcohol, Ethyl (B): <5 mg/dL (ref 0–10)

## 2010-09-30 LAB — HEMOCCULT GUIAC POC 1CARD (OFFICE): Fecal Occult Bld: NEGATIVE

## 2010-09-30 LAB — POCT CARDIAC MARKERS: Myoglobin, poc: 30.9 ng/mL (ref 12–200)

## 2010-10-01 ENCOUNTER — Ambulatory Visit (HOSPITAL_BASED_OUTPATIENT_CLINIC_OR_DEPARTMENT_OTHER): Payer: Medicare Other

## 2010-10-01 NOTE — H&P (Signed)
NAME:  [name], [name]               ACCOUNT NO.:  [id number]  MEDICAL RECORD NO.:  [id number]           PATIENT TYPE:  E  LOCATION:  [facility]                         FACILITY:  [facility]  PHYSICIAN:  Hartley Barefoot, MD    DATE OF BIRTH:  10-11-1950  DATE OF ADMISSION:  09/16/2010 DATE OF DISCHARGE:                             HISTORY & PHYSICAL   PRIMARY CARE PHYSICIAN:  Marcene Duos, M.D. at [facility].  CARDIOLOGISTEduardo Osier Sharyn Lull, M.D.  CHIEF COMPLAINT:  Passing out.  HISTORY OF PRESENT ILLNESS:  This is a 60 year old morbidly obese female with a history of schizoaffective disorder who was brought to the ED today after she reportedly passed out four times.  The patient is a poor historian, but tells me that she woke at 6:00 a.m. this morning and went into the bathroom.  She fell over backwards.  Her event was witnessed by her fiance who told the patient that she was out for approximately 30 minutes.  During that time, her eyes moved and her arms moved some. There was no bowel or urinary incontinence.  When she awoke, she was initially confused.  Afterwards, the patient says that she had two episodes in the car while being driven to the hospital where she passed out again.  The patient mentions that she had some chest pain that started last night and just prior to passing out and today she felt palpitations.  After her first episode, she had headache and neck pain in addition to chest pain.  She describes her chest pain as a pressure that radiates across her chest.  It is continuous in nature and thus far has not been relieved in the ED.  She also described some chronic dyspnea on exertion that she tells me has been worsening over the last few days.  Finally she mentions that she does not believe she had a seizure this morning as she is familiar with seizures she used to have them when she drank alcohol heavily.  She no longer drinks.  PAST MEDICAL HISTORY: 1.  Significant for recurrent UTI. 2. Hypothyroidism. 3. Morbid obesity. 4. Obstructive sleep apnea with hypoventilation syndrome. 5. Schizoaffective disorder. 6. Bipolar disorder. 7. Mild coronary artery disease. 8. Asthma. 9. She has systolic congestive heart failure with an EF of 45-50 and     diffuse hypokinesis on her last echocardiogram done in May 2011.  She is status post cholecystectomy and status post bilateral tubal     ligation.  HOME MEDICATIONS: 1. Zolpidem 5 mg 1 tablet daily at bedtime. 2. Trazodone 100 mg two tabs daily at bedtime. 3. Synthroid 100 mcg 1 tablet every evening. 4. Seroquel 300 mg 2 tablets daily at bedtime. 5. Omeprazole 20 mg 1 capsule twice daily. 6. Nitroglycerin 0.4 mg 1 tablet every 5 minutes as needed up to 3     doses for chest pain. 7. Nabumetone 500 mg 1 tablet every 12 hours as needed for pain. 8. Metoprolol tartrate 50 mg one tab twice daily. 9. Loratadine 10 mg 1 tablet daily. 10.Lisinopril 40 mg 1 tablet twice daily. 11.[name] 6 mg 1 tablet daily  at bedtime. 12.Folic acid 1 tablet every morning. 13.Fluticasone 50 mcg each nostril two sprays every morning. 14.Depakote ER 500 mg 2 tablets daily at bedtime. 15.Benztropine 0.5 mg 1 tablet twice daily. 16.Aspirin 325 mg 2 tablets daily. 17.Amlodipine 5 mg 1 tablet daily. 18.Advair Diskus 1 puff twice daily.  ALLERGIES:  She has allergies to PENICILLIN and SULFA and that both of them cause hives.  REVIEW OF SYSTEMS:  As per HPI, otherwise the patient says that she has no insomnia, anorexia, night sweats, or fever.  HEAD:  She does report headache over the past 2 days.  No changes in her vision, pain in her ears, eyes, nose, or throat.  CHEST:  She denies difficulty breathing, but does endorse dyspnea on exertion.  She has a slight nonproductive cough.  She is not bringing up any sputum or hemoptysis. CARDIOVASCULAR:  She reports some palpitations as per HPI and chest pain.  However, she  denies orthopnea.  She has chronic lower extremity swelling and she denies PND.  GASTROINTESTINAL:  She denies abdominal pain, vomiting, or changes to her bowel habits.  GENITOURINARY:  She denies hematuria, dysuria, and does endorse chronic frequency, says she is up some 13 times a night going to the bathroom.  SKIN:  She denies any rashes, bruises, or lesions.  NEUROLOGIC:  She is here with syncope or possible seizures.  PSYCHIATRIC:  She denies any depression or suicidal ideations.  FAMILY HISTORY:  Significant for her mother who died with lung cancer. Her father who died of heart disease.  SOCIAL HISTORY:  She is an ex-smoker and she no longer drinks any alcohol.  She does not use any recreational drugs.  She lives at home with her fiance.  PHYSICAL EXAMINATION:  GENERAL:  This is a well-developed morbidly obese Caucasian female lying in no apparent distress in the [facility]. VITAL SIGNS:  Temperature 98.2, pulse 78, respirations 18, blood pressure 145/86. HEENT:  Head is atraumatic, normocephalic.  Eyes are anicteric with pupils that are equal and round.  Nose shows no nasal discharge or exterior lesions.  Mouth has moist mucous membranes with a reddened tongue.  Good dentition. NECK:  Supple with midline trachea.  No JVD.  No lymphadenopathy.  No carotid bruits to my auscultation. HEART:  Regular rate and rhythm without obvious murmurs, rubs, or gallops. ABDOMEN:  Obese, nontender, nondistended.  She has bowel sounds. EXTREMITIES:  All four have 2+ edema.  Peripheral pulses are intact. She is able to move all four extremities. SKIN:  No rashes, bruises, or lesions. NEUROLOGIC:  Cranial nerves II through XII are grossly intact.  She has no facial asymmetries.  No obvious focal neuro deficits. PSYCHIATRIC:  The patient is alert and oriented.  Her demeanor is appropriate, but she questions why I am asking her questions about her history and seems slightly suspicious of me.   Her grooming is moderate.  LABORATORY DATA:  Pertinent for white count of 7.1, hemoglobin 12.2, hematocrit 36.0, platelets 490.  Sodium 132, potassium 3.8, chloride 95, bicarb 110, BUN 7, creatinine 1.0.  Valproic acid level is 77, which is therapeutic.  UA, BNP, and LFTs are all pending.  X-ray of her chest, CT head are pending.  Stat D-dimer and cardiac enzymes are also pending.  ASSESSMENT:  Dr. Hartley Barefoot has seen and examined the patient, collected a history, reviewed her chart and spoken at length with the patient and the PA about the case.  Her impression is as follows. 1.  Syncope, 4 episodes.  We will check head CT.  She will be admitted     to telemetry and monitored for arrhythmia.  Orthostatics were     checked in the ED and found to be negative.  Her neuro exam is     nonfocal.  We will cycle her cardiac enzymes.  Check her carotid     Dopplers and check a D-dimer as her chest pain seems pleuritic.     This is unlikely a seizure.  No urinary or bowel incontinence.  She     did not bite her tongue. 2. Chest pain, pleuritic.  We will check a D-dimer.  CT angio, D-dimer     is elevated. 3. Psychiatry may need to adjust psychiatric medications as they     may have contributed to her symptoms. 4. Hypertension, stable at this point.  We will hold her lisinopril     and her Norvasc in case she needs a CT angio with contrast. 5. Hyponatremia.  Stable improved from her last admission.  We will     hold on IV fluids due to her history of heart failure. 6. This patient is a full code.     Stephani Police, PA   ______________________________ Hartley Barefoot, MD    MLY/MEDQ  D:  09/16/2010  T:  09/16/2010  Job:  [id number]  cc:   Marcene Duos, M.D. Eduardo Osier. Sharyn Lull, M.D.  Electronically Signed by Algis Downs PA on 09/21/2010 03:14:53 PM Electronically Signed by Hartley Barefoot MD on 10/01/2010 09:50:02 PM

## 2010-10-05 LAB — POCT I-STAT, CHEM 8
Calcium, Ion: 1.24 mmol/L (ref 1.12–1.32)
Glucose, Bld: 110 mg/dL — ABNORMAL HIGH (ref 70–99)
HCT: 40 % (ref 36.0–46.0)
Hemoglobin: 13.6 g/dL (ref 12.0–15.0)
TCO2: 29 mmol/L (ref 0–100)

## 2010-10-05 LAB — DIFFERENTIAL
Basophils Relative: 1 % (ref 0–1)
Eosinophils Absolute: 0 10*3/uL (ref 0.0–0.7)
Eosinophils Relative: 1 % (ref 0–5)
Monocytes Absolute: 0.6 10*3/uL (ref 0.1–1.0)
Monocytes Relative: 9 % (ref 3–12)
Neutrophils Relative %: 47 % (ref 43–77)

## 2010-10-05 LAB — CBC
HCT: 37.1 % (ref 36.0–46.0)
Hemoglobin: 12.9 g/dL (ref 12.0–15.0)
MCHC: 34.7 g/dL (ref 30.0–36.0)
MCV: 101.8 fL — ABNORMAL HIGH (ref 78.0–100.0)
RBC: 3.65 MIL/uL — ABNORMAL LOW (ref 3.87–5.11)

## 2010-10-14 ENCOUNTER — Emergency Department (HOSPITAL_COMMUNITY): Payer: Medicare Other

## 2010-10-14 ENCOUNTER — Inpatient Hospital Stay (HOSPITAL_COMMUNITY)
Admission: EM | Admit: 2010-10-14 | Discharge: 2010-10-15 | DRG: 310 | Disposition: A | Discharge status: Home / Self Care | Payer: Medicare Other | Attending: Internal Medicine | Admitting: Internal Medicine

## 2010-10-14 DIAGNOSIS — E039 Hypothyroidism, unspecified: Secondary | ICD-10-CM | POA: Diagnosis present

## 2010-10-14 DIAGNOSIS — F319 Bipolar disorder, unspecified: Secondary | ICD-10-CM | POA: Diagnosis present

## 2010-10-14 DIAGNOSIS — I4892 Unspecified atrial flutter: Principal | ICD-10-CM | POA: Diagnosis present

## 2010-10-14 DIAGNOSIS — I10 Essential (primary) hypertension: Secondary | ICD-10-CM | POA: Diagnosis present

## 2010-10-14 DIAGNOSIS — I4891 Unspecified atrial fibrillation: Secondary | ICD-10-CM | POA: Diagnosis present

## 2010-10-14 DIAGNOSIS — I428 Other cardiomyopathies: Secondary | ICD-10-CM | POA: Diagnosis present

## 2010-10-14 DIAGNOSIS — Z7982 Long term (current) use of aspirin: Secondary | ICD-10-CM

## 2010-10-14 DIAGNOSIS — F259 Schizoaffective disorder, unspecified: Secondary | ICD-10-CM | POA: Diagnosis present

## 2010-10-14 DIAGNOSIS — Z79899 Other long term (current) drug therapy: Secondary | ICD-10-CM

## 2010-10-14 DIAGNOSIS — G4733 Obstructive sleep apnea (adult) (pediatric): Secondary | ICD-10-CM | POA: Diagnosis present

## 2010-10-14 LAB — COMPREHENSIVE METABOLIC PANEL
ALT: 10 U/L (ref 0–35)
AST: 11 U/L (ref 0–37)
Albumin: 3.5 g/dL (ref 3.5–5.2)
CO2: 25 mEq/L (ref 19–32)
Calcium: 9.2 mg/dL (ref 8.4–10.5)
Chloride: 104 mEq/L (ref 96–112)
Creatinine, Ser: 0.88 mg/dL (ref 0.4–1.2)
GFR calc Af Amer: >60 mL/min (ref >60)
GFR calc non Af Amer: >60 mL/min (ref >60)
Sodium: 136 mEq/L (ref 135–145)

## 2010-10-14 LAB — URINE MICROSCOPIC-ADD ON

## 2010-10-14 LAB — DIFFERENTIAL
Lymphocytes Relative: 41 % (ref 12–46)
Monocytes Absolute: 0.8 10*3/uL (ref 0.1–1.0)
Monocytes Relative: 13 % — ABNORMAL HIGH (ref 3–12)
Neutro Abs: 2.7 10*3/uL (ref 1.7–7.7)
Neutrophils Relative %: 44 % (ref 43–77)

## 2010-10-14 LAB — RAPID URINE DRUG SCREEN, HOSP PERFORMED
Amphetamines: NOT DETECTED
Barbiturates: NOT DETECTED
Opiates: NOT DETECTED

## 2010-10-14 LAB — CBC
HCT: 33.6 % — ABNORMAL LOW (ref 36.0–46.0)
Hemoglobin: 11.4 g/dL — ABNORMAL LOW (ref 12.0–15.0)
MCH: 33.4 pg (ref 26.0–34.0)
MCHC: 33.9 g/dL (ref 30.0–36.0)
RBC: 3.41 MIL/uL — ABNORMAL LOW (ref 3.87–5.11)

## 2010-10-14 LAB — URINALYSIS, ROUTINE W REFLEX MICROSCOPIC
Bilirubin Urine: NEGATIVE
Glucose, UA: NEGATIVE mg/dL
Hgb urine dipstick: NEGATIVE
Ketones, ur: NEGATIVE mg/dL
Nitrite: NEGATIVE
Specific Gravity, Urine: 1.011 (ref 1.005–1.030)
pH: 6 (ref 5.0–8.0)

## 2010-10-14 LAB — MAGNESIUM: Magnesium: 1.6 mg/dL (ref 1.5–2.5)

## 2010-10-14 LAB — PHOSPHORUS: Phosphorus: 3.7 mg/dL (ref 2.3–4.6)

## 2010-10-14 LAB — POCT CARDIAC MARKERS
Myoglobin, poc: 31.4 ng/mL (ref 12–200)
Troponin i, poc: <0.05 ng/mL (ref 0.00–0.09)

## 2010-10-15 LAB — PROTIME-INR: Prothrombin Time: 13.6 seconds (ref 11.6–15.2)

## 2010-10-17 ENCOUNTER — Emergency Department (HOSPITAL_COMMUNITY): Payer: Medicare Other

## 2010-10-17 ENCOUNTER — Emergency Department (HOSPITAL_COMMUNITY)
Admission: EM | Admit: 2010-10-17 | Discharge: 2010-10-17 | Disposition: A | Discharge status: Home / Self Care | Payer: Medicare Other | Attending: Emergency Medicine | Admitting: Emergency Medicine

## 2010-10-17 DIAGNOSIS — R011 Cardiac murmur, unspecified: Secondary | ICD-10-CM | POA: Insufficient documentation

## 2010-10-17 DIAGNOSIS — I10 Essential (primary) hypertension: Secondary | ICD-10-CM | POA: Insufficient documentation

## 2010-10-17 DIAGNOSIS — R0789 Other chest pain: Secondary | ICD-10-CM | POA: Insufficient documentation

## 2010-10-17 DIAGNOSIS — R110 Nausea: Secondary | ICD-10-CM | POA: Insufficient documentation

## 2010-10-17 DIAGNOSIS — J40 Bronchitis, not specified as acute or chronic: Secondary | ICD-10-CM | POA: Insufficient documentation

## 2010-10-17 DIAGNOSIS — J45909 Unspecified asthma, uncomplicated: Secondary | ICD-10-CM | POA: Insufficient documentation

## 2010-10-17 DIAGNOSIS — R05 Cough: Secondary | ICD-10-CM | POA: Insufficient documentation

## 2010-10-17 DIAGNOSIS — E119 Type 2 diabetes mellitus without complications: Secondary | ICD-10-CM | POA: Insufficient documentation

## 2010-10-17 DIAGNOSIS — R509 Fever, unspecified: Secondary | ICD-10-CM | POA: Insufficient documentation

## 2010-10-17 DIAGNOSIS — R059 Cough, unspecified: Secondary | ICD-10-CM | POA: Insufficient documentation

## 2010-10-17 LAB — BASIC METABOLIC PANEL
CO2: 23 mEq/L (ref 19–32)
Chloride: 99 mEq/L (ref 96–112)
Creatinine, Ser: 0.82 mg/dL (ref 0.4–1.2)
GFR calc Af Amer: >60 mL/min (ref >60)
Potassium: 3.3 mEq/L — ABNORMAL LOW (ref 3.5–5.1)
Sodium: 129 mEq/L — ABNORMAL LOW (ref 135–145)

## 2010-10-17 LAB — POCT CARDIAC MARKERS: Troponin i, poc: <0.05 ng/mL (ref 0.00–0.09)

## 2010-10-17 LAB — DIFFERENTIAL
Basophils Relative: 0 % (ref 0–1)
Lymphs Abs: 2.3 10*3/uL (ref 0.7–4.0)
Monocytes Absolute: 0.7 10*3/uL (ref 0.1–1.0)
Monocytes Relative: 12 % (ref 3–12)
Neutro Abs: 2.4 10*3/uL (ref 1.7–7.7)
Neutrophils Relative %: 45 % (ref 43–77)

## 2010-10-17 LAB — CBC
Hemoglobin: 11.4 g/dL — ABNORMAL LOW (ref 12.0–15.0)
MCH: 34.2 pg — ABNORMAL HIGH (ref 26.0–34.0)
MCHC: 35.3 g/dL (ref 30.0–36.0)
MCV: 97 fL (ref 78.0–100.0)
RBC: 3.33 MIL/uL — ABNORMAL LOW (ref 3.87–5.11)

## 2010-10-18 NOTE — H&P (Signed)
[name], [name]               ACCOUNT NO.:  [id number]  MEDICAL RECORD NO.:  [id number]           PATIENT TYPE:  E  LOCATION:  [facility]                         FACILITY:  [facility]  PHYSICIAN:  Mariea Stable, MD   DATE OF BIRTH:  12-17-1950  DATE OF ADMISSION:  10/14/2010 DATE OF DISCHARGE:                             HISTORY & PHYSICAL   PRIMARY CARE PHYSICIAN:  Marcene Duos, MD at [facility].  CHIEF COMPLAINT:  Fever, chills.  HISTORY OF PRESENT ILLNESS:  [name] is a 60 year old woman with past medical history significant for nonischemic cardiomyopathy with an EF of 45-50% per echo in May 2011, hypertension, obstructive sleep apnea, hypothyroidism, morbid obesity, and schizoaffective disorder with bipolar disorder who presents with chief complaint of fever and chills. Apparently, she reports that she was seen at her PCP's office yesterday and had some blood work done.  She was sent home at the time.  However, today she continued to have the same symptoms that she describes as fever and chills without any other specific complaints and decided to visit the emergency department.  Initial evaluation in the emergency department did not reveal any etiology for her fever, chills, or shortness of breath until an EKG was obtained that demonstrated atrial fibrillation with a ventricular rate of 90 beats per minute.  At that time, a [facility] hospitalist was called to admit the patient for further evaluation and management of her AFib although she had converted spontaneously back to normal sinus rhythm.  REVIEW OF SYSTEMS:  The patient has some occasional pleuritic-type chest pain where she describes as some mild sharp pain in the left chest with deep inspiration only.  Upon review of records including the discharge summary dictated on September 20, 2010, the patient had pleuritic chest pain that was resolved at the time.  PAST MEDICAL HISTORY: 1. Syncope thought to be secondary  to vasovagal versus medication side     effect. 2. Pleuritic chest pain. 3. Schizoaffective disorder with bipolar disorder. 4. Obstructive sleep apnea. 5. Hypertension. 6. Hypothyroidism. 7. Nonischemic cardiomyopathy with an EF of 45-50% as of May 2011 and     normal coronaries per cardiac catheterization in 2007.  MEDICATIONS: 1. Percocet 1 tablet every 6 hours p.r.n. pain. 2. Synthroid 112 mcg p.o. daily. 3. Advair 100/50 mcg 1 puff inhaled b.i.d. 4. Amlodipine 5 mg p.o. daily. 5. Aspirin 325 mg p.o. daily. 6. Depakote 1000 mg p.o. at bedtime. 7. Fluticasone 50 mcg 2 sprays in each nostril at bedtime. 8. Folic acid 1 mg p.o. daily. 9. [name] 6 mg p.o. at bedtime. 10.Loratadine 10 mg p.o. daily. 11.Metoprolol 50 mg p.o. b.i.d. 12.Nabumetone 500 mg p.o. q.12 h p.r.n. pain. 13.Nitroglycerin 0.4 mg sublingual q.5 minutes p.r.n. chest pain up to     3 doses. 14.Omeprazole 20 mg p.o. b.i.d. 15.Trazodone 100 mg 2 tablets p.o. at bedtime. 16.Ambien 5 mg p.o. at bedtime.  ALLERGIES:  To SULFA.  SOCIAL HISTORY:  The patient is currently living with her fiance.  She is an ex-smoker and denies any alcohol or illicit drug use.  FAMILY HISTORY:  Positive for lung cancer and  heart disease.  PHYSICAL EXAMINATION:  VITAL SIGNS:  Temperature 97.7, blood pressure 136/75, heart rate of 88, respirations 18, oxygen saturation 100% on room air. GENERAL:  This is an obese older woman lying in bed in no acute distress. HEENT:  Head is normocephalic, atraumatic.  Pupils equally round and reactive to light and accommodation.  Extraocular movements are intact. Sclerae are anicteric.  Mucous membranes are moist. NECK:  Thick but supple.  Unable to determine JVD. HEART:  Heart sounds are slightly distant but normal S1, S2 with a regular rate and rhythm.  There are no obvious murmurs, gallops, or rubs. LUNGS:  Clear to auscultation anterolaterally. ABDOMEN:  Obese with positive bowel sounds,  soft, nontender, nondistended. EXTREMITIES:  Again morbidly obese with some mild edema. PSYCHIATRIC:  The patient has a flat affect but is interacting appropriately.  LABORATORY DATA:  WBC 6.2, hemoglobin 11.4, platelets 197.  Point-of- care cardiac enzymes are negative with a CK-MB of less than 1, troponin- I less than 0.05, and CK-MB of 31.4.  Sodium is 136, potassium 3.4, chloride 104, bicarb 25, chloride 118, BUN 4, creatinine 0.88.  LFTs are within normal limits.  Urinalysis is negative except for trace leukocyte esterase with 3-6 wbc's, but many squamous epithelia on microscopy.  Electrocardiogram:  Initial EKG demonstrates coarse fib/flutter with ventricular rate of 90.  Subsequent EKG shows sinus rhythm versus a wandering pacemaker.  ASSESSMENT AND PLAN: 1. Paroxysmal atrial fibrillation.  Currently the patient has     converted back to normal sinus rhythm.  At this point, we will     admit to tele to monitor.  We will go ahead and increase her     metoprolol from 50 mg b.i.d. to 100 mg p.o. b.i.d.  In case she     does go into atrial fibrillation, this will provide more adequate     rate control. 2. Given her a CHADS score of 1-2 as the patient has hypertension with     nonischemic cardiomyopathy though EF is greater than 45% and she     reports diabetes which is not confirmed.  We will go ahead and make     the assumption that she has a CHADS score of 1.  However, using the     CHADS-VASc score, the patient does have a score of 2.  Discussed     the risk of embolism and, to my knowledge, she is not a high risk     for bleeding.  We will go ahead and initiate Coumadin at this     point.  The patient had a recent TSH checked which was 5.3.  She is     currently taking Synthroid 112 mcg p.o. daily.  We will recheck a     TSH.  We will also check a urine drug screen as well as a 2-D     echocardiogram to reassess left ventricular function as well as     valvular structures.   The patient does have obstructive sleep apnea     which is moderate to severe and may be contributing to the     paroxysmal AFib.  Can consider cardiology consultation in the     morning for further input. 3. Fever/chills.  Currently, there is no evidence to suggest an     underlying infection.  Of note, the patient did report some     episodes of occasionally pleuritic chest pain with deep inspiration     although she currently  has none.  This had been documented before     as resolved on the last discharge summary.  EKG does not have any     signs of pericarditis.  There is no reason to suggest pleurisy at     this time.  Furthermore, she does not have a leukocytosis or     infiltrate on chest x-ray to suggest a pneumonia.  Urinalysis is     slightly dirty on microscopy but is contaminated with squamous     epithelia and I assume that this is just not a good catch.  At this     point, we will defer on any antibiotics. 4. Hypertension.  We will go ahead and stop the patient's Norvasc as I     am increasing her metoprolol from 50 to 100 mg p.o. b.i.d.  If the     patient's blood pressure starts to increase, we will go ahead     reinstitute the amlodipine at 5 mg. 5. Hypothyroidism.  As mentioned above, we will continue the patient's     home dose of Synthroid at 112 mcg p.o. daily, but we will check a     TSH.  This will need to be followed as an outpatient to make sure     that she remains euthyroid. 6. Schizoaffective/bipolar disorder.  We will continue with home     medications at those dosages.     Mariea Stable, MD     MA/MEDQ  D:  10/14/2010  T:  10/14/2010  Job:  [id number]  cc:   Marcene Duos, M.D.  Electronically Signed by Mariea Stable MD on 10/18/2010 09:07:42 PM

## 2010-10-18 NOTE — Discharge Summary (Signed)
[name], [name]               ACCOUNT NO.:  [id number]  MEDICAL RECORD NO.:  [id number]           PATIENT TYPE:  I  LOCATION:  2010                         FACILITY:  [facility]  PHYSICIAN:  Marinda Elk, M.D.DATE OF BIRTH:  12-Mar-1951  DATE OF ADMISSION:  10/14/2010 DATE OF DISCHARGE:  10/15/2010                              DISCHARGE SUMMARY   PRIMARY CARE DOCTOR:  [facility].  CARDIOLOGISTEduardo Osier Sharyn Lull, MD  DISCHARGE DIAGNOSES: 1. Paroxysmal atrial flutter. 2. Hypertension. 3. Hypothyroidism.  DISCHARGE MEDICATIONS: 1. Amiodarone 200 mg p.o. b.i.d. for 7 days and then daily. 2. Pradaxa 150 mg p.o. b.i.d. 3. Advair 100/50 one puff b.i.d. 4. Amlodipine 5 mg daily. 5. Aspirin 325 mg daily. 6. Benztropine 0.5 mg b.i.d. 7. Depakote 500 mg one tablet at bedtime. 8. Fluticasone 50 mcg 2 sprays every morning. 9. Folic acid 1 mg every morning. 10.[name] 6 mg 1 tablet at bedtime. 11.Lisinopril 40 mg daily. 12.Loratadine 10 mg daily. 13.Metoprolol 50 mg b.i.d. 14.Nabumetone 500 mg q.12 h. 15.Nitroglycerin sublingual q.5 minutes p.r.n. 16.Omeprazole 20 mg b.i.d. 17.Percocet 5/325 mg 1 tablet q.6 h. 18.Cervical 300 mg at bedtime. 19.Synthroid 112 mcg every morning. 20.Trazodone 100 mg at bedtime. 21.Ambien 5 mg daily.  PROCEDURES PERFORMED:  None.  CONSULTANT:  Mohan N. Sharyn Lull, MD.  BRIEF ADMITTING HISTORY AND PHYSICAL:  This is a 60 year old female with past medical history significant for nonischemic cardiomyopathy with an EF of 45% to 50% per echo in 2011, hypertension, obstructive sleep apnea, hypothyroidism, morbid obesity, schizoaffective disorder, bipolar, who presents with chief complaint fever or chills.  Apparently, she reports that when she saw her PCP yesterday in the office she had some blood work.  She was sent home at that time; however, she continued to have the same symptoms that she describes as fevers without chills and nonspecific, so  she decided to come to the ED for further evaluation.  Here in the ED, no fever or chills have been found or shortness of breath until the EKG was done demonstrated A-flutter and then a repeated EKG that showed sinus rhythm, so we were asked to admit and further evaluate.  PHYSICAL EXAMINATION:  VITAL SIGNS:  Temperature 97, blood pressure 136/75, heart rate of 88, respirations 18.  She was sating 100% on room air. GENERAL:  Obese female in bed. HEENT:  Normocephalic, atraumatic.  Pupils equal, round, and reactive to light. NECK:  Supple. CARDIOVASCULAR:  Regular.  Distant heart sounds.  Normal S1 and S2. LUNGS:  Good air movement and clear to auscultation. ABDOMEN:  Positive bowel sounds, nontender, nondistended. EXTREMITIES:  No clubbing, cyanosis, or edema.  LABORATORY DATA ON ADMISSION:  White count of 6.2, hemoglobin of 11.4, platelet count 197.  Point-of-care cardiac markers, CK-MB less than 1, troponin less than 0.05.  Sodium 136, potassium 3.4, chloride 104, bicarb of 25, glucose of 118, BUN of 4, creatinine 0.8.  LFTs within normal limits.  UA negative was negative for trace leukocytes, many squamous cells.  EKG shows atrial flutter and then a repeated EKG sinus rhythm.  ASSESSMENT AND PLAN: 1. Paroxysmal atrial flutter.  This was  discussed with her     cardiologist, Dr. Sharyn Lull, over the phone.  The EKGs both were     faxed to him.  He recommends to start Pradaxa and amiodarone 200 mg     p.o. b.i.d. for 7 days and then daily.  She has a followup     appointment with him on Oct 22, 2010, at 3 p.m. to follow up on     this.  Given her CHADS score of being 2, anticoagulation was     recommended..  The patient agreed and she will follow up with Dr.     Sharyn Lull. 2. Hypertension, currently stable.  No changes were made. 3. Hypothyroidism, continues to be stable.  No changes were made.  VITALS ON DAY OF DISCHARGE:  Temperature 98, pulse of 80, respiration of 18, blood  pressure 138/80, O2 sat 92% on room air.  Labs on day of discharge showed phosphorus 3.7, mag 1.6.  Alcohol level less than 5.  UDS is negative.  Her urine microscopy showed 3-6 white blood cells, many bacteria.  Her sodium was 138, potassium 3.4, chloride 104, bicarb 25, glucose of 118, BUN of 4, creatinine 0.8.  LFTs within normal limits.     Marinda Elk, M.D.     AF/MEDQ  D:  10/15/2010  T:  10/16/2010  Job:  [id number]  cc:   Eduardo Osier. Sharyn Lull, M.D. [facility].  Electronically Signed by Marinda Elk M.D. on 10/18/2010 02:29:33 PM

## 2010-10-25 ENCOUNTER — Emergency Department (HOSPITAL_COMMUNITY): Payer: Medicare Other

## 2010-10-25 ENCOUNTER — Emergency Department (HOSPITAL_COMMUNITY)
Admission: EM | Admit: 2010-10-25 | Discharge: 2010-10-25 | Disposition: A | Discharge status: Home / Self Care | Payer: Medicare Other | Attending: Emergency Medicine | Admitting: Emergency Medicine

## 2010-10-25 DIAGNOSIS — E785 Hyperlipidemia, unspecified: Secondary | ICD-10-CM | POA: Insufficient documentation

## 2010-10-25 DIAGNOSIS — R071 Chest pain on breathing: Secondary | ICD-10-CM | POA: Insufficient documentation

## 2010-10-25 DIAGNOSIS — E669 Obesity, unspecified: Secondary | ICD-10-CM | POA: Insufficient documentation

## 2010-10-25 DIAGNOSIS — I10 Essential (primary) hypertension: Secondary | ICD-10-CM | POA: Insufficient documentation

## 2010-10-25 DIAGNOSIS — R05 Cough: Secondary | ICD-10-CM | POA: Insufficient documentation

## 2010-10-25 DIAGNOSIS — K224 Dyskinesia of esophagus: Secondary | ICD-10-CM | POA: Insufficient documentation

## 2010-10-25 DIAGNOSIS — R259 Unspecified abnormal involuntary movements: Secondary | ICD-10-CM | POA: Insufficient documentation

## 2010-10-25 DIAGNOSIS — R112 Nausea with vomiting, unspecified: Secondary | ICD-10-CM | POA: Insufficient documentation

## 2010-10-25 DIAGNOSIS — J45909 Unspecified asthma, uncomplicated: Secondary | ICD-10-CM | POA: Insufficient documentation

## 2010-10-25 DIAGNOSIS — I4891 Unspecified atrial fibrillation: Secondary | ICD-10-CM | POA: Insufficient documentation

## 2010-10-25 DIAGNOSIS — F209 Schizophrenia, unspecified: Secondary | ICD-10-CM | POA: Insufficient documentation

## 2010-10-25 DIAGNOSIS — R059 Cough, unspecified: Secondary | ICD-10-CM | POA: Insufficient documentation

## 2010-10-25 DIAGNOSIS — E119 Type 2 diabetes mellitus without complications: Secondary | ICD-10-CM | POA: Insufficient documentation

## 2010-10-25 DIAGNOSIS — F319 Bipolar disorder, unspecified: Secondary | ICD-10-CM | POA: Insufficient documentation

## 2010-10-25 DIAGNOSIS — R1013 Epigastric pain: Secondary | ICD-10-CM | POA: Insufficient documentation

## 2010-10-25 LAB — COMPREHENSIVE METABOLIC PANEL
ALT: 7 U/L (ref 0–35)
AST: 9 U/L (ref 0–37)
Albumin: 3.2 g/dL — ABNORMAL LOW (ref 3.5–5.2)
CO2: 31 mEq/L (ref 19–32)
Chloride: 91 mEq/L — ABNORMAL LOW (ref 96–112)
Creatinine, Ser: 0.82 mg/dL (ref 0.4–1.2)
GFR calc Af Amer: >60 mL/min (ref >60)
Sodium: 127 mEq/L — ABNORMAL LOW (ref 135–145)
Total Bilirubin: 0.1 mg/dL — ABNORMAL LOW (ref 0.3–1.2)

## 2010-10-25 LAB — DIFFERENTIAL
Basophils Relative: 0 % (ref 0–1)
Lymphocytes Relative: 43 % (ref 12–46)
Lymphs Abs: 2.4 10*3/uL (ref 0.7–4.0)
Monocytes Absolute: 0.5 10*3/uL (ref 0.1–1.0)
Monocytes Relative: 9 % (ref 3–12)
Neutro Abs: 2.6 10*3/uL (ref 1.7–7.7)
Neutrophils Relative %: 47 % (ref 43–77)

## 2010-10-25 LAB — CBC
HCT: 33.8 % — ABNORMAL LOW (ref 36.0–46.0)
Hemoglobin: 11.6 g/dL — ABNORMAL LOW (ref 12.0–15.0)
MCH: 33 pg (ref 26.0–34.0)
MCHC: 34.3 g/dL (ref 30.0–36.0)
MCV: 96.3 fL (ref 78.0–100.0)
RBC: 3.51 MIL/uL — ABNORMAL LOW (ref 3.87–5.11)

## 2010-10-25 LAB — URINALYSIS, ROUTINE W REFLEX MICROSCOPIC
Bilirubin Urine: NEGATIVE
Glucose, UA: NEGATIVE mg/dL
Hgb urine dipstick: NEGATIVE
Protein, ur: NEGATIVE mg/dL
Specific Gravity, Urine: 1.006 (ref 1.005–1.030)

## 2010-10-25 LAB — POCT CARDIAC MARKERS: Troponin i, poc: <0.05 ng/mL (ref 0.00–0.09)

## 2010-10-25 LAB — URINE MICROSCOPIC-ADD ON

## 2010-11-02 ENCOUNTER — Emergency Department (HOSPITAL_COMMUNITY)
Admission: EM | Admit: 2010-11-02 | Discharge: 2010-11-03 | Disposition: A | Discharge status: Home / Self Care | Payer: Medicare Other | Attending: Emergency Medicine | Admitting: Emergency Medicine

## 2010-11-02 DIAGNOSIS — R079 Chest pain, unspecified: Secondary | ICD-10-CM | POA: Insufficient documentation

## 2010-11-02 DIAGNOSIS — Z79899 Other long term (current) drug therapy: Secondary | ICD-10-CM | POA: Insufficient documentation

## 2010-11-02 DIAGNOSIS — I428 Other cardiomyopathies: Secondary | ICD-10-CM | POA: Insufficient documentation

## 2010-11-02 DIAGNOSIS — J45909 Unspecified asthma, uncomplicated: Secondary | ICD-10-CM | POA: Insufficient documentation

## 2010-11-02 DIAGNOSIS — E119 Type 2 diabetes mellitus without complications: Secondary | ICD-10-CM | POA: Insufficient documentation

## 2010-11-02 DIAGNOSIS — F22 Delusional disorders: Secondary | ICD-10-CM | POA: Insufficient documentation

## 2010-11-02 DIAGNOSIS — Z8659 Personal history of other mental and behavioral disorders: Secondary | ICD-10-CM | POA: Insufficient documentation

## 2010-11-02 DIAGNOSIS — I10 Essential (primary) hypertension: Secondary | ICD-10-CM | POA: Insufficient documentation

## 2010-11-02 NOTE — Consult Note (Signed)
[name], [name]               ACCOUNT NO.:  [id number]   MEDICAL RECORD NO.:  [id number]          PATIENT TYPE:  INP   LOCATION:  4737                         FACILITY:  [facility]   PHYSICIAN:  Cassell Clement, M.D. DATE OF BIRTH:  1950-12-23   DATE OF CONSULTATION:  DATE OF DISCHARGE:  04/17/2008                                 CONSULTATION   CHIEF COMPLAINT:  Syncope.   HISTORY:  This is a 60 year old Caucasian female with a past history of  hypertension and morbid obesity.  She had a past history of seizure  disorder and a history of paranoid schizophrenia.  She states that she  was sitting in a chair presenting with a friend on the evening of  April 16, 2008 when she suddenly blacked out without warning.  She  denies any antecedent or subsequent chest pain.  She thinks that she was  unconscious for 5 minutes according to what the friend told her.  She  did not have any incontinence or tongue biting or evidence of seizure.  She was brought to emergency room where she was evaluated and admitted.   FAMILY HISTORY:  Positive for heart disease in her father.   PAST MEDICAL HISTORY:  Positive for morbid obesity and history of  tobacco abuse, but she no longer smokes.  She has had a history of high  blood pressure.   She is followed at [facility] by the physicians there.   MEDICATIONS:  Her medications are numerous and are noted in the history  and physical and include benztropine 0.5 mg b.i.d., Depakote 1 g at  bedtime, folic acid 1 mg daily, hydrochloride and acetaminophen 5/500  one daily p.r.n., [name] 6 mg daily, lisinopril 40 mg b.i.d., loxapine  10 mg at bedtime, metoprolol 50 mg b.i.d., Nitrostat p.r.n., omeprazole  40 mg daily, Rhinocort once daily in each nostril, Seroquel 300 mg at  bedtime, Synthroid 100 mcg daily, trazodone 200 mg at bedtime, and  Ventolin HFA 2 puffs every 6 hours p.r.n.   PHYSICAL EXAMINATION:  VITAL SIGNS:  Her blood pressure in the hospital  has been satisfactory in the range of 134/96.  Rhythm has been stable.  Normal sinus rhythm in the 60s to 80s.  Respirations are normal and she  has been afebrile.  Her oxygen saturation is 96% on room air.  GENERAL APPEARANCE:  A morbidly obese woman.  She states that she weighs  338 pounds.  She is lying in bed on her side in no discomfort or  respiratory distress.  HEENT:  The head and neck exam unremarkable.  NECK:  Carotids are normal.  Jugular venous pressure difficult to see  because of obesity.  LUNGS:  Clear to auscultation.  HEART:  No S3 gallop.  ABDOMEN:  Morbidly obese.  EXTREMITIES:  Thick ankles but no pitting edema.   Workup so far in the hospital includes CT angiogram which showed no  evidence of pulmonary emboli.  There was cardiomegaly present.  There  was no evidence of dissection or an aortic aneurysm.  CT of the head  showed no acute intracranial abnormality and  nothing to explain her  seizure.   LABORATORY DATA:  Includes a serial CK-MBs and troponin-I which are  negative for myocardial ischemia.  Her electrocardiogram shows normal  sinus rhythm and nonspecific T-wave flattening, but no ischemic changes.   DIAGNOSTIC STUDIES:  Her two-dimensional echocardiogram interpreted by  Dr. Sharyn Lull done today shows normal LV systolic function and no valvular  abnormalities and nothing to explain her syncope.   IMPRESSION:  Syncope, undetermined etiology, no evidence of coronary  ischemia playing a role here.   RECOMMENDATIONS:  We would optimize blood pressure management and then  consider discharge with outpatient followup at [facility].  She  certainly needs dietary counseling to help her loose weight.  I do not  believe that a stress Cardiolite is indicated here and would be  technically very difficult with her massive obesity.   Many thanks for your help to continuing to see this pleasant woman with  you.           ______________________________  Cassell Clement, M.D.     TB/MEDQ  D:  04/17/2008  T:  04/18/2008  Job:  [id number]   cc:   [facility]

## 2010-11-02 NOTE — Discharge Summary (Signed)
NAME:  [name], EDUARDO               ACCOUNT NO.:  [id number]   MEDICAL RECORD NO.:  [id number]          PATIENT TYPE:  INP   LOCATION:  4711                         FACILITY:  [facility]   PHYSICIAN:  Mohan N. Sharyn Lull, M.D. DATE OF BIRTH:  December 05, 1950   DATE OF ADMISSION:  06/26/2007  DATE OF DISCHARGE:  06/29/2007                               DISCHARGE SUMMARY   ADMISSION DIAGNOSES:  1. Atypical chest pain, rule out myocardial infarction.  2. Hypertension.  3. Hiatus hernia.  4. Morbid obesity.  5. Seizure disorder.  6. History of schizophrenia.  7. History of rectal bleeding in the past.  8. History of tobacco abuse.  9. Degenerative joint disease.  10.Positive family history of coronary artery disease.   FINAL DIAGNOSES:  1. Status post atypical chest pain, myocardial infarction ruled out,      negative Persantine Myoview.  2. Hypertension.  3. Hiatus hernia.  4. Morbid obesity.  5. History of seizure disorder.  6. History of schizophrenia.  7. History of rectal bleeding in the past.  8. History of tobacco abuse.  9. Degenerative joint disease.  10.Positive family history of coronary artery disease.   DISCHARGE HOME MEDICATIONS:  1. Metoprolol 50 mg twice daily.  2. Lisinopril 20 mg twice daily.  3. Depakote ER 500 mg two tablets at bedtime.  4. Albuterol inhaler 2 puffs as needed.  5. [name] 6 mg every morning.  6. Omeprazole 20 mg twice daily.  7. Nitrostat 0.4 mg sublingual, use as directed.  8. Folic acid 1 mg daily.  9. Loxapine succinate 10 mg at night.  10.Seroquel 300 mg at bedtime.  11.Trazodone 100 mg at bedtime.  12.Synthroid 100 mcg 1 tablet daily.  13.Enteric-coated aspirin 81 mg 1 tablet daily.   DIET:  Low salt, low cholesterol.  The patient has been advised to  reduce weight.   ACTIVITY:  As tolerated.   CONDITION ON DISCHARGE:  Stable.   FOLLOW-UP:  With me in 1 week.   BRIEF HISTORY AND HOSPITAL COURSE:  [name] is a 60 year old white  female with past medical history significant for hypertension, morbid  obesity, hiatus hernia, tobacco abuse, seizure disorder, history of  schizophrenia, degenerative joint disease, positive family history of  coronary artery disease.  She came to the ER complaining of retrosternal  chest pain associated with nausea and mild shortness of breath.  She  took four sublingual nitro without relief.  Denies any palpitation,  lightheadedness or syncope.  Denies PND, orthopnea and leg swelling.  Denies cough, fever or chills.  States chest pain occasionally is  sharp/pressure, a grade 10/10, not related to food, breathing or  movement.  Also states chest pain not related to exertion.   PAST MEDICAL HISTORY:  As above.   PAST SURGICAL HISTORY:  She had tubal ligation in the past, had  hysterectomy in the past.   ALLERGIES:  She is allergic to PENICILLIN.   MEDICATION AT HOME:  She is on metoprolol, lisinopril, omeprazole,  Synthroid, folic acid, trazodone, Seroquel, Depakote, [name], and  Nitrostat.   SOCIAL HISTORY:  She is single,  has three children.  Smoked 4-5  cigarettes per day.  No history of alcohol abuse or drug abuse.  Presently on disability.  Born [place], [place], lives in  [place].   FAMILY HISTORY:  Father died of MI at the age of 58.  Mother is alive.  She had coronary artery disease and had an MI.  Three brothers and one  sister in good health.   EXAMINATION:  She is alert, awake, and oriented x3, in no acute  distress.  Blood pressure was 115/52, pulse was 84, regular.  Conjunctivae were pink.  NECK:  Supple, no JVD, no bruit.  LUNGS:  Clear to auscultation without rhonchi or rales.  CARDIOVASCULAR:  S1, S2 was normal.  There was a soft systolic murmur.  ABDOMEN:  Soft, obese, bowel sounds were present, nontender.  EXTREMITIES: There is no clubbing, cyanosis or edema.   X-ray showed no acute chest findings.  Three sets of cardiac enzymes  were  negative.  Hemoglobin was 14.6, hematocrit 43.  Potassium was 3.7.  TSH was elevated at 10.45.  Her Synthroid has been increased from 75 mcg  to 100 mcg daily.  Persantine Myoview showed a subtle area of ischemia  in the anterior wall, EF of 66%, with no evidence of myocardial  scarring.   BRIEF HOSPITAL COURSE:  The patient was admitted to telemetry unit.  MI  was ruled out by serial enzymes and EKG.  The patient subsequently  underwent a Persantine Myoview 2-day protocol, which showed no  significant area of ischemia, although a subtle area of ischemia could  not be ruled out in the anterior wall.  The patient had prior  catheterization last year which showed no evidence of significant  coronary artery disease and this subtle area of  decreased perfusion was felt to be breast tissue attenuation.  The  patient did not have any episodes of chest pain during the hospital  stay.  The patient will be discharged home on above medications and will  be followed up in my office in 1 week.      Eduardo Osier. Sharyn Lull, M.D.  Electronically Signed     MNH/MEDQ  D:  06/29/2007  T:  06/29/2007  Job:  [id number]

## 2010-11-02 NOTE — H&P (Signed)
[name]               ACCOUNT NO.:  [id number]   MEDICAL RECORD NO.:  [id number]          PATIENT TYPE:  INP   LOCATION:  4737                         FACILITY:  [facility]   PHYSICIAN:  [name], [name]:  1951/04/01   DATE OF ADMISSION:  04/16/2008  DATE OF DISCHARGE:                              HISTORY & PHYSICAL   PRIMARY CARE PHYSICIAN:  [name], M.D.   PRIMARY CARDIOLOGIST:  [name], M.D.   CHIEF COMPLAINT:  Chest pain.   HISTORY OF PRESENT ILLNESS:  A 60 year old female with history of  hypertension, morbid obesity, seizure disorder, paranoid schizophrenia,  previous history of cigarette smoking quit almost 4-5 years now.  Presented with complaints of chest pain.  The patient has been  experiencing chest pain since last evening.  It is constant,  retrosternal, increased on breathing, denies any association with  exertion.  Denies any radiation.  Denies any nausea, vomiting,  diaphoresis.  The patient, in addition, also is complaining of having  had lost consciousness, four times in total the last 24 hours.  Had  weakness once when she had loss of consciousness almost for five  minutes.  Denies any seizure-like activity.  Denies any tongue bite or  incontinence of urine.  Denies any weakness of limbs prior or post the  syncopal episodes.  The patient denies any abdominal pain, fever,  chills, headache, dysuria, discharge or diarrhea.   PAST MEDICAL HISTORY:  1. Hypertension.  2. Morbid obesity.  3. Seizure disorder.  4. History of tobacco abuse.  Quit now.  5. Paranoid schizophrenia.  6. Degenerative joint disease.   PAST SURGICAL HISTORY:  Partial hysterectomy.   FAMILY HISTORY:  Significant for coronary artery disease.   MEDICATIONS:  1. Benztropine 0.5 mg p.o. b.i.d.  2. Depakote 1 g p.o. at bedtime.  3. Folic acid 1 mg p.o. daily.  4. Hydrochloride/acetaminophen 5/500 mg p.o. daily p.r.n.  5. [name] 6 mg p.o.  daily.  6. Lisinopril 40 mg p.o. b.i.d.  7. Loxapine 10 mg p.o. bedtime.  8. Metoprolol 50 mg p.o. b.i.d.  9. Nitroglycerin p.r.n. chest pain.  10.Omeprazole 40 mg p.o. daily.  11.Rhinocort once per day each nostril daily.  12.Seroquel 300 mg p.o. at bedtime.  13.Synthroid 100 mcg p.o. daily.  14.Trazodone 200 mg p.o. bedtime.  15.Ventolin HFA two puffs q.6 h p.r.n.   ALLERGIES:  PENICILLIN.   REVIEW OF SYSTEMS:  As per history of present illness, nothing else  significant.   PHYSICAL EXAMINATION:  GENERAL:  The patient examined at bedside.  Not  in acute distress.  Denies any chest pain now.  VITAL SIGNS:  Blood pressure is 115/69, pulse 67 per minute, temperature  98.3, respirations 18 per minute, O2 saturation 99%.  HEENT:  Nonicteric, no pallor.  No facial asymmetry.  Tongue is midline.  PERLA postural.  CHEST:  Bilateral air entry, depressant, no rhonchi, no crepitation.  HEART:  S2, S2 heard.  ABDOMEN:  Soft, nontender.  Bowel sounds heard.  No guarding or  rigidity.  CNS:  Alert, awake, oriented to time, place,  person.  Moves upper and  lower extremities 5/5.  EXTREMITIES:  Peripheral pulses felt.  No edema.   LABORATORY DATA:  EKG shows normal sinus rhythm with nonspecific T wave  changes in all anterior leads.  CT of the chest angio protocol, no  evidence of any pulmonary embolism, aortic aneurysm or dissection,  cardiomegaly, vascular congestion.  Chest x-ray shows no acute findings.   Basic metabolic panel:  Sodium 137, potassium 3.8, chloride 103, CO2 28,  glucose 102, BUN 3, creatinine 0.6, calcium 8.4, troponin I less than  0.05.  CK MB less than 1.   ASSESSMENT:  1. Chest pain to rule out ACS.  2. Syncope.  3. History of seizure disorder.  4. History of paranoid schizophrenia.  5. Hypertension.  6. History of cigarette smoking.   PLAN:  Admit the patient to telemetry.  Will cycle cardiac enzymes.  Will get a CT of the head stat and also EEG in a.m.   Place patient on  seizure precaution and fall precaution.  Assume regular home  medications.  Check Depakote level.  Will place patient on gentle  hydration.  Check orthostatics in a.m.  Further recommendation as the  patient evolves.      [name], MD  Electronically Signed     ANK/MEDQ  D:  04/16/2008  T:  04/17/2008  Job:  [id number]

## 2010-11-02 NOTE — H&P (Signed)
NAMEBLIA, TOTMAN               ACCOUNT NO.:  [id number]   MEDICAL RECORD NO.:  [id number]          PATIENT TYPE:  IPS   LOCATION:  0307                          [facility]   PHYSICIAN:  Anselm Jungling, MD  DATE OF BIRTH:  1950-07-31   DATE OF ADMISSION:  01/24/2007  DATE OF DISCHARGE:                       PSYCHIATRIC ADMISSION ASSESSMENT   IDENTIFYING INFORMATION:  This is over 60 year old Native American  female, voluntary admission.   HISTORY OF PRESENT ILLNESS:  This is one of several admissions for this  60 year old patient who is widowed and who is well-known to [place].  She was  last here in November 29, 2006 to December 01, 2006.  Last evening, she  presented in the emergency room complaining of auditory hallucinations,  seeing the devil's face, hearing the voice of her deceased husband who  died in 11-11-2004.  She presented at the [facility] earlier in the day  and had been given some medications by her primary psychiatrist but had  felt that they did not help.  She was saying that she was having visions  of going into the arms of her deceased husband.  Felt that she could not  live any longer.  Seeing florid visual hallucinations of the devil with  bright red horns.  She denies any substance abuse but she has a very  distant history of some alcohol abuse in the distant past.  She is  unable to cite any specific precipitants or aggravating stressors for  this episode and says that this had been going on for about two days.  Denies any homicidal thought.  She said that she was suicidal and  thinking of overdosing on her own pills and has a history of prior  suicide attempts at least once, possibly twice, by overdose.   PAST PSYCHIATRIC HISTORY:  This is one of several admissions for this  pleasant 60 year old female who also has a history of prior admissions  to [facility], most recently in Nov 11, 2004.  Last [facility] admission  November 29, 2006 to December 01, 2006.  At that time  was also feeling that she  was having a crisis related to financial circumstances, getting her  medications correct and missing her deceased husband, all of which have  been recently more typical concerns of hers.  Frequently in the past has  had an exacerbation of symptoms when she has relationship problems and  does have some chronic grief related to the death of her husband in  Nov 11, 2004.  History of prior suicide attempts at least twice.  Currently, she  is followed by Dr. Allyne Gee at [facility]: [facility]   SOCIAL HISTORY:  Widowed Native American female who is living  independently in her own apartment.  She was married until her husband  was deceased in 11-Nov-2004.  She had a prior history of alcohol abuse but  reports abstinence for a full 10 years.  She has been on disability for  schizoaffective disorder since Nov 11, 1984 and receives an SSDI check.  She is  currently living with her boyfriend who is her main support system in  terms of medication management and appointments and social support.  She  is also connected to a church community.   MEDICAL HISTORY:  Multiple medical problems including diabetes mellitus  type 2, morbid obesity, gastroesophageal reflux disease, hypertension,  history of hiatal hernia, asthma and history of noncardiac chest pain.  She has been prescribed nitroglycerin.  She is followed by Dr.  __________ in the past.  She also has a history of hypothyroidism.   CURRENT MEDICATIONS:  Trazodone 150 mg p.o. q.h.s., nitroglycerin 0.4 mg  sublingual p.r.n. for chest pain, Seroquel 200 mg p.o. q.h.s., folic  acid 1 mg p.o. daily, Synthroid 75 mcg daily, loxapine 10 mg p.o.  q.h.s., Depakote ER 1000 mg p.o. q.h.s., omeprazole 20 mg p.o. b.i.d.,  lisinopril 20 mg b.i.d., benztropine 1 mg at bedtime, metoprolol 50 mg  b.i.d., albuterol 90 mcg q.4-6h. p.r.n. for asthma, [name] 6 mg at h.s.,  Rhinocort Aqua 2 sprays daily p.r.n. allergies and Vicodin 5/500 mg, 1  p.o.  q.4h. p.r.n. for pain and she typically has some knee pain.   ALLERGIES:  PENICILLIN.   PHYSICAL EXAMINATION:  Diagnostic studies were done in the emergency  room.  This is an obese Native American female.  Full physical exam and  review of systems was done.  She appears to be at her baseline in terms  of her physical health.  Height 5 feet 2 inches tall, weight 324 pounds,  temperature 98, pulse 89, respirations 20, blood pressure 160/99.  Her  weight is unchanged from her June admission.   LABORATORY DATA:  Urine drug screen was negative for all substances.  Alcohol level less than 5.  Urinalysis unremarkable.  CBC within normal  limits.  Chemistry unremarkable.  BUN is 3, creatinine 0.7, hemoglobin  13.9, hematocrit 41.   MENTAL STATUS EXAM:  Fully alert female.  She is awake and alert in bed.  She declines to participate in interview.  Says she is simply too tired.  Rather resistant.  She is able to converse with [place] a little bit but  generally is reclusive, requesting more blankets, does not want to  participate, not so much guarded or paranoia.  However, just wanting to  sleep and not to have [place] bother her at this point.  She has no evidence  of suicidal thought today.  No complaints of hallucinations.  She has  been cooperative, fully alert, no response lag.  Speech is normal in  pace, tone and production.  No pressure.  Cognitively, she is intact.   DIAGNOSES:  AXIS I:  Schizoaffective disorder.  History of alcohol abuse  in 10-year remission.  AXIS II:  Deferred.  AXIS III:  History of seizure disorder, obesity, diabetes mellitus type  2, chronic back pain and noncardiac chest pain.  AXIS IV:  Moderate (grief, chronic related to her husband's death).  AXIS V:  Current 35; past year 28.   PLAN:  To voluntarily admit the patient to stabilize her mood and  alleviate her hallucinations.  At this point, we are going to continue  her current meds until we can get a better idea  from her of what her  aggravating factors are and will pursue additional history.   ESTIMATED LENGTH OF STAY:  Five days.  We will also be coordinating with  mental health and her primary psychiatrist.      Claris Che A. Lorin Picket, N.P.      Anselm Jungling, MD  Electronically Signed    MAS/MEDQ  D:  01/25/2007  T:  01/26/2007  Job:  [id number]

## 2010-11-02 NOTE — Consult Note (Signed)
[name]               ACCOUNT NO.:  [id number]   MEDICAL RECORD NO.:  [id number]          PATIENT TYPE:  INP   LOCATION:  6529                         FACILITY:  [facility]   PHYSICIAN:  Anselmo Rod, M.D.  DATE OF BIRTH:  March 07, 1951   DATE OF CONSULTATION:  02/05/2007  DATE OF DISCHARGE:                                 CONSULTATION   REASON FOR CONSULTATION:  Coffee ground emesis.   ASSESSMENT:  1. Coffee ground emesis, question hematemesis; rule out esophagitis,      peptic ulcer disease versus Mallory-Weiss tear.  2. History of rectal bleeding for the last two months, rule out      colonic polyps, hemorrhoids, etc.  3. History of reflux and hiatal hernia.  4. History of chest pain, rule out myocardial infarction.  5. Hypertension.  6. Morbid obesity.  7. Schizophrenia.  8. Seizure disorder.  9. Family history of colon cancer, rule out colonic polyps, masses,      etcetera.  10.Hypothyroidism.  11.Status post bilateral tubal ligation.  12.Status post partial vaginal hysterectomy.   RECOMMENDATIONS:  1. Will do an EGD, once MI has been ruled out, in the next couple of      days.  2. PPI is to be continued.  3. Serial CBCs.  4. Avoid all nonsteroidals.  5. Full liquid diet only.   DISCUSSION:  [name] is a 60 year old white female with the  above-mentioned medical problems who was admitted with coffee ground  emesis to the emergency room.  She claims she was in her usual state of  health until yesterday evening when she started having nausea with  vomiting and retrosternal chest pain.  She has also had bright red  bleeding per rectum for the last 2 months with bowel movements.  She  denies hard stools or straining.  Appetite is good, weight has been  stable.  Her mother was diagnosed with colon cancer in late 69's and  died at the age of 61.  Patient denies a history of nonsteroidal use or  abuse.  She has not had similar symptoms in the past.  There  is no  previous history of ulcer disease.   PAST MEDICAL HISTORY:  See list above.   ALLERGIES:  PENICILLIN.   MEDICATIONS AT HOME:  Metoprolol, lisinopril, omeprazole, Synthroid,  folic acid, trazodone, Seroquel, Depakote, [name], loxapine, Nitrostat  sublingual p.r.n., albuterol.   SOCIAL HISTORY:  1. The patient is widowed.  2. She has 3 children.  3. She denies use of alcohol or street drugs.  4. She smokes 3-4 cigarettes per day.  5. She is disabled and lives in [place], [place].   FAMILY HISTORY:  1. Her mother died of colon cancer at 15.  2. Her father has heart disease and is in his 64's.  3. There is no known family history of breast, ovarian, endometrial or      cervical cancer.  4. She has 4 siblings who are alive and well.   REVIEW OF SYSTEMS:  1. Coffee ground emesis.  2. Retrosternal chest pain.  3. Rectal  bleeding.  4. No history of abnormal weight loss.  5. Nausea with vomiting since yesterday.   GENERAL PHYSICAL EXAMINATION:  A morbidly obese white female in no acute  distress, laying comfortably in bed with blood pressure of 151/87, pulse  of 80 per minute, respiratory rate 20 per minute, temperature 98.1  degrees Fahrenheit.  HEENT EXAMINATION:  Atraumatic, normocephalic head.  Marland Kitchen  NECK:  Supple.  CHEST:  Clear to auscultation, S1 S2 regular.  ABDOMEN:  Soft, obese, nondistended, nontender with normal bowel sounds.  No hepatosplenomegaly, no masses palpable.  RECTAL EXAMINATION:  Deferred.   LABORATORY EVALUATION ON ADMISSION:  A white count of 5000 with  hemoglobin of 12 g/dL and hematocrit of 35, MCV is 99 FL, platelet count  217,000, PT 13 with INR 1, PTT 30.  Sodium 140, potassium 3.3, chloride  102, BUN less than 3, glucose 94, bicarbonate 29.5, creatinine 0.6  mg/dL.  First set of CK MBs are negative, total protein 5.6, albumin  2.8, AST 13, ALT 12, alkaline phosphatase 49, total bili 0.3, direct  bili less than 0.1.   PLANS:  As  above.  Further recommendations to be made in followup.      Anselmo Rod, M.D.  Electronically Signed     JNM/MEDQ  D:  02/05/2007  T:  02/06/2007  Job:  [id number]   cc:   Eduardo Osier. Sharyn Lull, M.D.

## 2010-11-02 NOTE — Discharge Summary (Signed)
[name], [name]               ACCOUNT NO.:  [id number]   MEDICAL RECORD NO.:  [id number]          PATIENT TYPE:  IPS   LOCATION:  0406                          [facility]  [facility]   PHYSICIAN:  Jasmine Pang, M.D. DATE OF BIRTH:  Nov 08, 1950   DATE OF ADMISSION:  06/17/2008  DATE OF DISCHARGE:  06/18/2008                               DISCHARGE SUMMARY   IDENTIFYING INFORMATION:  This is a 60 year old Native American female,  widowed.  This is a voluntary admission.   HISTORY OF PRESENT ILLNESS:  This is one of several inpatient admissions  for this pleasant 60 year old with a history of schizoaffective disorder  who initially presented in the emergency room complaining of chest pain.  She had a negative evaluation for chest pain with no acute cardiac  problems found and near the end of her stay in the emergency room, she  admitted that she had been having a few suicidal thoughts.  Today, she  reports that she had some fleeting suicidal thoughts during the week  without any plan to actually harm herself.  She denies any intent to  harm herself.  Denies any auditory hallucinations.  Has been pleasant  and cooperative here with a sense of humor.  We have talked with her  fiance who has no concerns about her coming home today.  Says that she  has been compliant with her medications, other than missing possibly two  doses this week when she overslept.  He categorically denies any  concerns about her safety.  Feels it is appropriate for her to come  home.   PAST PSYCHIATRIC HISTORY:  Followed by Dr. Livingston Diones at [facility].  Her last admission here was a brief admission in  July 2008.  She has a history of schizoaffective disorder, and is  generally compliant with her medication.  She has a distant history of  admissions to a [facility] [place] with  the last being about 4 years ago.  She has been generally stable on her  current  regimen.  In the past, she has had issues with depression  related to the death of her first husband.  She is currently in a stable  relationship which is a stabilizing factor for her.   MEDICAL HISTORY:  Significant for some history of chest pain and  obesity.   CURRENT MEDICATIONS:  1. Benztropine 0.5 mg p.o. b.i.d.  2. Depakote ER 1000 mg p.o. q.h.s.  3. Folic acid 1 mg p.o. daily.  4. [name] 6 mg daily.  5. Hydrocodone 5/500 mg p.o. q.4 h p.r.n. for aches and pains in her      legs.  6. Lisinopril 40 mg b.i.d.  7. Loxapine 10 mg p.o. q.h.s.  8. Metoprolol tartrate 50 mg p.o. b.i.d.  9. Nitroglycerin 0.4 mg sublingual as directed for chest pain.  10.Omeprazole 40 mg daily.  11.Rhinocort Aqua one squirt each nostril p.r.n. for allergies.  12.Seroquel 300 mg p.o. q.h.s.  13.Synthroid 100 mcg daily.  14.Trazodone 200 mg at bedtime.   DRUG ALLERGIES:  PENICILLIN.  MENTAL STATUS EXAM:  Fully alert, pleasant, cooperative, bright affect.  She has remained in her bed today, feeling that she did not want to go  to group but has been cooperative with staff.  Mood is neutral.  Speech  is normal.  Thinking logical and coherent.  Denying any suicidal  thoughts.  No delusional statements made.  She has guests at her home,  including her son who came for [place].  Denies any dangerous thoughts.  Insight good.  Impulse control and judgment within normal limits.  No  homicidal thoughts.  No dangerous ideations.  Cognitively intact.   DIAGNOSES:  AXIS I:  Schizoaffective disorder.  AXIS II:  Deferred.  AXIS III:  Morbid obesity, hypothyroidism, hypertension, history of  noncardiac chest pain.  AXIS IV:  Deferred.  AXIS V: Current 59 past year, 69 estimated.   PLAN:  Discharge her today.  Medications are unchanged.  We have spoken  with her fiance who was in agreement with her coming home and she will  follow up with Livingston Diones at [facility].  An  appointment is  on her discharge instruction sheet.      Margaret A. Lorin Picket, N.P.      Jasmine Pang, M.D.  Electronically Signed    MAS/MEDQ  D:  06/18/2008  T:  06/18/2008  Job:  [id number]

## 2010-11-02 NOTE — Discharge Summary (Signed)
NAME:  [name], [name]               ACCOUNT NO.:  [id number]   MEDICAL RECORD NO.:  [id number]          PATIENT TYPE:  INP   LOCATION:  6529                         FACILITY:  [facility]   PHYSICIAN:  Mohan N. Sharyn Lull, M.D. DATE OF BIRTH:  1951/03/21   DATE OF ADMISSION:  02/05/2007  DATE OF DISCHARGE:  02/06/2007                               DISCHARGE SUMMARY   ADMISSION DIAGNOSES:  1. Chest pain, rule out myocardial infarction.  2. Rule out upper gastrointestinal bleeding.  3. Mild coronary artery disease.  4. Hypertension.  5. Hypercholesteremia.  6. Hypothyroidism.  7. Seizure disorder.  8. History of schizophrenia.   FINAL DIAGNOSES:  1. Chest pain, rule out myocardial infarction.  2. Rule out upper gastrointestinal bleeding.  3. Mild coronary artery disease.  4. Hypertension.  5. Hypercholesteremia.  6. Hypothyroidism.  7. Seizure disorder.  8. History of schizophrenia.   Discharge home medications were same:  1. Metoprolol 50 mg p.o. b.i.d.  2. Lisinopril 20 mg p.o. b.i.d.  3. Omeprazole 20 mg p.o. b.i.d.  4. Synthroid 75 mcg p.o. daily.  5. Folic acid 1 mg p.o. daily.  6. Trazodone 150 mg p.o. daily at night.  7. Seroquel 100 mg p.o. daily at night.  8. Depakote ER 500 mg two p.o. at night/  9. [name] 6 mg p.o. h.s.  10.__________  10 mg p.o. h.s.  11.Nitrostat sublingual p.r.n.  12.Albuterol inhaler p.r.n.   DIET:  Low-salt, low-cholesterol.   ACTIVITY:  As tolerated.   FOLLOW-UP:  With me in 1 week.  Follow up with GI in 1 week.  The  patient advised if she notices any bleeding, should report to the ER.  The patient refused for any further GI workup and signed out against  medical advice.   BRIEF HISTORY AND HOSPITAL COURSE:  [name] is a 60 year old white  female with a past medical history significant for hypertension, morbid  obesity, tobacco abuse, hiatus hernia, seizure disorder, history of  schizophrenia, degenerative joint disease, positive  family history of  coronary artery disease.  She came to the ER complaining of retrosternal  chest pain described as pressure, grade 10/10, associated with nausea,  vomiting, coffee-grounds and bright red blood.  Denies any shortness of  breath.  Denies palpitation, lightheadedness or syncope.  Denies PND,  orthopnea or leg swelling.  No history of exertional chest pain but  complains of exertional dyspnea.  Denies any NSAID or aspirin abuse.  Denies any cough, fever or chills.  Denies abdominal pain.  Denies any  urinary complaints.   PAST MEDICAL HISTORY:  As above.   PAST SURGICAL HISTORY:  She had tubal ligation in the past, had partial  hysterectomy in the past.   ALLERGIES:  She is allergic to PENICILLIN.   MEDICATION AT HOME:  1. Metoprolol 50 mg p.o. b.i.d.  2. Lisinopril 20 mg p.o. b.i.d.  3. Omeprazole 20 mg p.o. b.i.d.  4. Synthroid 75 mcg daily.  5. Folic acid 1 mg p.o. daily.  6. Trazodone 150 mg p.o. h.s.  7. Seroquel 100 mg p.o. h.s.  8. Depakote ER  500 mg two p.o. h.s.  9. [name] 6 mg p.o. h.s.,  10.__________  p.o. h.s.  11.Nitrostat sublingual p.r.n.  12.Albuterol inhaler p.r.n.   SOCIAL HISTORY:  She is single, has three children.  Smoked 4-5  cigarettes per day.  No history of alcohol or drug abuse.  The patient  presently on disability.  She was born in [place], lives in [place].   FAMILY HISTORY:  .  Mother died of MI at age of 24.  Father is alive.  He 72 and history of MI.  She has three brothers and one sister in good  health.   On examination, she is alert, awake, and oriented x3, in no acute  distress.  Blood pressure was 148/94, pulse was 80 and regular.  Conjunctivae were pink.  NECK:  Supple, no JVD, no bruit.  LUNGS:  Clear to auscultation without rhonchi.  CARDIOVASCULAR:  S1, S2 was normal.  There was a soft systolic murmur.  ABDOMEN:  Soft.  Bowel sounds were present.  Obese, nontender.  EXTREMITIES:  There was no clubbing,  cyanosis or edema.   LABS:  EKG showed normal sinus rhythm with nonspecific ST-T wave  changes.  Hemoglobin was 12, hematocrit 35, white count 5.0.  Potassium  was 3.3.  Her CPK-MB was less than 1.0.  Troponin I was less than 0.5.  Creatinine was 0.6, BUN was less than 3.  Cholesterol was 138, HDL 36,  LDL 100.  Repeat hemoglobin was 11.6, hematocrit 33.4, white count of  5.4.   BRIEF HOSPITAL COURSE:  The patient was admitted to a telemetry unit.  MI was ruled out by serial enzymes and EKG.  GI consultation was  obtained with Dr. Loreta Ave.  The patient was scheduled for EGD and  colonoscopy on August 20 but the patient refused for any GI workup.  The  patient had not had any episodes of further GI bleeding during the  hospital stay and signed out against medical advice February 06, 2007.      [name]. Sharyn Lull, M.D.  Electronically Signed     MNH/MEDQ  D:  03/29/2007  T:  03/29/2007  Job:  [id number]

## 2010-11-03 ENCOUNTER — Emergency Department (HOSPITAL_COMMUNITY): Payer: Medicare Other

## 2010-11-03 LAB — POCT I-STAT, CHEM 8
BUN: 9 mg/dL (ref 6–23)
Calcium, Ion: 1.21 mmol/L (ref 1.12–1.32)
Chloride: 93 mEq/L — ABNORMAL LOW (ref 96–112)
Creatinine, Ser: 1.2 mg/dL (ref 0.4–1.2)
Glucose, Bld: 100 mg/dL — ABNORMAL HIGH (ref 70–99)
HCT: 37 % (ref 36.0–46.0)
Hemoglobin: 12.6 g/dL (ref 12.0–15.0)
Potassium: 4.1 mEq/L (ref 3.5–5.1)
Sodium: 128 mEq/L — ABNORMAL LOW (ref 135–145)
TCO2: 27 mmol/L (ref 0–100)

## 2010-11-03 LAB — URINALYSIS, ROUTINE W REFLEX MICROSCOPIC
Bilirubin Urine: NEGATIVE
Ketones, ur: NEGATIVE mg/dL
Nitrite: NEGATIVE
Specific Gravity, Urine: 1.015 (ref 1.005–1.030)
Urobilinogen, UA: 1 mg/dL (ref 0.0–1.0)

## 2010-11-03 LAB — POCT CARDIAC MARKERS: Troponin i, poc: <0.05 ng/mL (ref 0.00–0.09)

## 2010-11-03 LAB — CBC
Hemoglobin: 11.4 g/dL — ABNORMAL LOW (ref 12.0–15.0)
MCH: 33 pg (ref 26.0–34.0)
MCHC: 34.4 g/dL (ref 30.0–36.0)
RDW: 13.4 % (ref 11.5–15.5)

## 2010-11-03 LAB — DIFFERENTIAL
Basophils Relative: 0 % (ref 0–1)
Eosinophils Absolute: 0 10*3/uL (ref 0.0–0.7)
Eosinophils Relative: 0 % (ref 0–5)
Monocytes Absolute: 0.7 10*3/uL (ref 0.1–1.0)
Monocytes Relative: 10 % (ref 3–12)
Neutro Abs: 3.9 10*3/uL (ref 1.7–7.7)

## 2010-11-03 LAB — URINE MICROSCOPIC-ADD ON

## 2010-11-03 LAB — POCT PREGNANCY, URINE: Preg Test, Ur: NEGATIVE

## 2010-11-05 NOTE — H&P (Signed)
NAME:  [name], [name]                         ACCOUNT NO.:  [id number]   MEDICAL RECORD NO.:  [id number]                   PATIENT TYPE:  OBV   LOCATION:  1828                                 FACILITY:  [facility]   PHYSICIAN:  Kem Boroughs, M.D.                 DATE OF BIRTH:  March 01, 1951   DATE OF ADMISSION:  11/29/2002  DATE OF DISCHARGE:                                HISTORY & PHYSICAL   HISTORY OF PRESENT ILLNESS:  The patient is a 60 year old white woman who is  admitted to [facility] for further evaluation of chest pain.   The patient, who has no past history of cardiac disease, presented to the  emergency department after experiencing an episode of chest pain at home.  This occurred while she was sitting. The chest pain was described as a sharp  discomfort in the left anterior chest. It reportedly radiated down the left  arm.  She noted dyspnea and diaphoresis along with the chest pain.  The pain  resolved spontaneously in approximately 1-1/2 to 2 hours.  She is free of  chest pain at this time. The chest pain was not described as a pressure,  tightness, heaviness, or squeezing.  It appeared not to be related to  position, activity, meals, or respirations. There were no exacerbating or  ameliorating factors.   The patient was hospitalized here for chest pain in September of 2003.  At  that time an Adenosine Cardiolite was negative.   The patient has no past history of cardiac disease, including no history of  myocardial infarction, congestive heart failure, or arrhythmia. She does  have a history of hypertension and is on medications for this. There is no  history of hyperlipidemia, diabetes mellitus, or smoking.  She believes that  her mother suffered from heart disease in her 29's.   The patient has a history of depression and paranoid schizophrenia for which  she is on medication.   The patient lives with her boyfriend.  She has children who are no longer in  the household. She does not work.  She is disabled due to her psychiatric  condition.   FAMILY HISTORY:  Noncontributory other than as described above.   REVIEW OF SYSTEMS:  No new problems related to her head, eyes, ears, nose,  mouth, throat, lungs, gastrointestinal system, genitourinary system, or  extremities. There is no history of neurological disorder. There is no  history of fever, chills, or weight loss.   PHYSICAL EXAMINATION:  VITAL SIGNS: Blood pressure 124/65, pulse 79 and  regular, respirations 20, temperature 97.3.  GENERAL: The patient was an obese, middle-aged, white woman in no  discomfort. She was alert, oriented, appropriate, and responsive.  HEENT:  Unremarkable.  NECK:  Without thyromegaly or adenopathy. Carotid pulses were palpable  bilaterally and without bruits.  HEART: Normal S1 and S2. There was no S3, S4, murmur, rub, or  click.  Cardiac rhythm was regular. No chest wall tenderness was noted.  LUNGS:  Clear.  ABDOMEN:  Soft and nontender. There was no mass, hepatosplenomegaly, bruits,  distention, rebound, guarding, rigidity. Bowel sounds were normal.  BREASTS: PELVIC: RECTAL: Not performed as they were not pertinent for the  reason for acute care hospitalization.  EXTREMITIES:  Without edema, deviation, or deformity. Radial and dorsalis  pedal pulses were palpable bilaterally.  The screening neurologic survey was  unremarkable.   The electrocardiogram revealed normal sinus rhythm with a mildly prolonged  VT interval. The tracing was otherwise unremarkable.  The chest radiograph  report was pending at the time of this dictation.  Initial CK was 41, with a  CK-MB of 0.6, and troponin of less than 0.01.  White count 8.0, hemoglobin  12.8, hematocrit 37.2.  Potassium 3.7, BUN 11, and creatinine 0.8.   IMPRESSION:  1. Chest pain, rule out coronary artery disease.  Negative Adenosine     Cardiolite in September of 2003.  2. Hypertension.  3. Depression.   4. Paranoid schizophrenia.   PLAN:  1. Telemetry.  2. Serial cardiac enzymes.  3. Aspirin.  4. Nitropaste.  5. Renal profiles.  6. Additional testing per Kem Boroughs, M.D.     Quita Skye. Waldon Reining, MD                   Kem Boroughs, M.D.    MSC/MEDQ  D:  11/29/2002  T:  11/30/2002  Job:  [id number]
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
## 2010-11-05 NOTE — H&P (Signed)
. [facility]  Patient:    [name], [name] Visit Number: [id number] MRN: [id number]          Service Type: MED Location: [id number] Attending Physician:  [name] Dictated by:   [name], M.D. Admit Date:  07/07/2001   CC:         [name], M.D.  [name], M.D., [facility]  [name], M.D.   History and Physical  DATE OF BIRTH:  [birth year]  CHIEF COMPLAINT:  Chest pain.  HISTORY OF PRESENTING ILLNESS:  [age]-year-old white female with a history of paranoid schizophrenia who came to the [facility] ER when she was brought by the EMS, who found her lying down at [facility] complaining of chest pain.  Her pain had started on walking, was stabbing in nature, radiating to both arms, lasted about 45 minutes to an hour, was 8/10 in intensity, was worsened by cough and deep breath.  She did have partial relief with nitroglycerin, two pills, in the [facility].  She also had tingling in her arms.  It was associated with nausea, vomiting, dyspnea, sweating, palpitations and feeling light-headed.  She felt it to be like a heartburn.  She had ham and cheese before the chest pain started.  She has a history of upper respiratory tract infection and had an ER visit in November of 2002.  She has been having cough, fevers and chills over the past one month and she says she had heart failure a year ago and was admitted to [facility] and no procedures were done.  PAST MEDICAL HISTORY:  History of paranoid schizophrenia, schizoaffective disorder, depression, partial hysterectomy, hiatal hernia, status post endoscopy on March 15, 2000 with a Savary dilation for dysphagia.  The EGD showed no ulcers and was a normal study done by Dr. Judie Petit T. Pleas [name]. Degenerative joint disease, arthritis, migraine headaches, deafness, essential hypertension and  hypothyroidism.  REVIEW OF SYSTEMS:  Significant for vaginal discharge, diarrhea, burning micturition; no blood in orifices, no known history of hypercholesterolemia.  MEDICATIONS: 1. Claritin 10 mg q.d. 2. Klonopin 0.5 mg two p.o. q.h.s. 3. HCTZ 12.5 mg q.d. 4. Trazodone one to two p.o. q.h.s. 5. Prevacid 10 mg b.i.d. -- a questionable dose.  SOCIAL HISTORY:  She lives with her husband [name], who has renal failure.  She has three kids.  Apparently, she says she drinks socially, however, other previous history showed that she has severe alcohol abuse history.  She denies using street drugs.  She smokes a half a pack per day over the past 30 years.  FAMILY HISTORY:  Significant for coronary artery disease in dad who had an MI at the age of 39; he is alive at the age of 7.  She has two aunts who have mental problems.  ALLERGIES:  She is allergic to PENICILLIN which causes hives.  Most of her history is questionable, as she is hard of hearing and part of her history does not correspond to her previous histories as obtained from her old chart.  She also feels that she is pregnant and that that is a miracle after she has had the hysterectomy.  PHYSICAL EXAMINATION:  VITAL SIGNS:  Temperature 97.8, heart rate 90, blood pressure 140/90, respiratory rate 20, oxygen saturation 97% on room air.  GENERAL:  She is alert and oriented x3, in no acute distress.  HEENT:  Extraocular muscles intact.  Dry  tongue.  Normocephalic, atraumatic head.  Nasopharynx clear.  CARDIOVASCULAR:  Rate/rhythm regular.  No murmurs, rubs, or gallops.  RESPIRATORY:  Clear to auscultation bilaterally.  ABDOMEN:  Obese, small, soft, nontender.  Bowel sounds present.  NEUROLOGIC:  Exam was nonfocal.  No cranial nerve deficits.  No sensory or motor deficits.  EXTREMITIES:  No cyanosis, clubbing or edema.  LABORATORY AND ACCESSORY DATA:  Urine drug screen was negative.  D-dimer was 0.38.   Alcohol level less than 5.  Urinalysis negative.  CK 37, MB 1.2, troponin I 0.01.  Sodium 139, potassium 2.8, chloride 104, bicarb 26, BUN 6, creatinine 0.8, glucose 93, calcium 9.7.  White count 9.5, hemoglobin 14.3, hematocrit 42, MCV 95 and platelets 369,000.  EKG showed a normal sinus rhythm at 96 beats per minute, showed T wave flattening in the precordial leads.  There was no old EKG to compare.  Q-T corrected was 437 msec.  Chest x-ray apparently is normal as noted by the EDPs notes.  There is no chest x-ray available here to take a look at.  IMPRESSION:  She is a 60 year old white female with significant mental illness, presenting with chest pain that sounds atypical in nature.  The fact that she had relief with nitroglycerin may suggest that this could have a gastroesophageal component to it such as gastroesophageal reflux disease or dysphagia but her history of fever and chills suggests that she could also have bronchitis, however, she does have a strong family history of tobacco abuse, alcohol abuse, hypertension and questionable history of heart failure. There were no records available for documentation so we will need to rule out myocardial infarction.  1. Chest pain:  We will admit her to telemetry bed, give her oxygen, aspirin,    a beta blocker, Toprol.  We will check cardiac enzymes x3, check an    electrocardiogram in the morning, check a lipid panel.  We will treat her    alternative symptoms of gastroesophageal reflux disease with Mylanta and    Protonix and treat possible bronchitis with doxycycline. 2. Hypokalemia, this questionably secondary to her vomiting versus secondary    to alcohol abuse:  We will also check a magnesium level and repeat    potassium. 3. Schizophrenia:  We will continue her home medicines, trazodone, Klonopin.     She was on Risperdal previously but it was not one of the medicine in her    pill bottles now.  We will have to check into her  charts from before. 4. Hypertension:  She was on a beta blocker in the past.  We will continue the    same and we will also continue hydrochlorothiazide. 5. Hypothyroidism:  We will check a thyroid-stimulating hormone level and    continue her Synthroid. 6. Other issues are presently stable.  Upon discharge, she will go back home    and follow up with [facility] for her mental needs. Dictated by:   [name], M.D. Attending Physician:  [name] DD:  07/07/01 TD:  07/07/01 Job: [id number]

## 2010-11-05 NOTE — Cardiovascular Report (Signed)
NAME:  [name], [name]               ACCOUNT NO.:  [id number]   MEDICAL RECORD NO.:  [id number]          PATIENT TYPE:  INP   LOCATION:  1826                         FACILITY:  [facility]   PHYSICIAN:  Ricki Rodriguez, M.D.  DATE OF BIRTH:  1950-10-19   DATE OF PROCEDURE:  DATE OF DISCHARGE:                              CARDIAC CATHETERIZATION   PROCEDURE PERFORMED BY:  Dr. Ricki Rodriguez, M.D.   HOSPITAL LOCATION:  Emergency Room.   TITLE OF PROCEDURE:  Left heart catheterization, selective coronary  angiography, left ventricular function study.   INDICATIONS:  This 60 year old white female with hypertension had recurrent  chest pain along with a sweating spell.   APPROACH:  Right femoral artery using 4 French sheath and catheter.   COMPLICATIONS:  None.   HEMODYNAMIC DATA:  The left ventricular pressure was 123/8.  Aortic pressure  was 123/81.   LEFT VENTRICULOGRAM:  The left ventriculogram showed normal left ventricular  systolic function with ejection fraction of 65-70%.   CORONARY ANATOMY:  The left main coronary artery was unremarkable.  Left  anterior descending coronary artery:  The left anterior descending coronary  artery was also unremarkable.  Left circumflex coronary artery:  The left  circumflex coronary artery was unremarkable.  Right coronary artery:  The  right coronary artery was dominant and unremarkable.   IMPRESSION:  1. Normal coronaries.  2. Normal left ventricular systolic function.   RECOMMENDATIONS:  This patient will be treated medically with lifestyle  modification.      Ricki Rodriguez, M.D.  Electronically Signed     ASK/MEDQ  D:  02/21/2006  T:  02/21/2006  Job:  [id number]

## 2010-11-05 NOTE — Discharge Summary (Signed)
[name], [name]               ACCOUNT NO.:  [id number]   MEDICAL RECORD NO.:  [id number]          PATIENT TYPE:  IPS   LOCATION:  0505                          FACILITY:  [facility]   PHYSICIAN:  Geoffery Lyons, M.D.      DATE OF BIRTH:  Jun 28, 1950   DATE OF ADMISSION:  11/29/2006  DATE OF DISCHARGE:  12/01/2006                               DISCHARGE SUMMARY   CHIEF COMPLAINT AND PRESENT ILLNESS:  This was one of multiple  admissions to [facility], last one being May of 2007,  for this 60 year old white female voluntarily admitted.  Presented to  the ED complaining of bleeding from vaginal and rectal areas.  Negative  workup.  She claimed to have fallen on that day, could not get up.  Still grieving the loss of the husband.  Has a house man and companion  who is supportive.  She was admitted for stabilization.   PAST PSYCHIATRIC HISTORY:  Dr. Allyne Gee at [facility].  Last  admission here was in May of 2007.   ALCOHOL/DRUG HISTORY:  Denies active use of any substances.   MEDICAL HISTORY:  Obesity, chronic back pain, diabetes mellitus, history  of seizures.   MEDICATIONS:  Cogentin 0.5 mg twice a day and 2 at night, Depakote 500  mg, 2 at night, folic acid 100 mg daily, lisinopril 20 mg twice a day,  metoprolol 50 mg per day, NTG as needed, omeprazole 20 mg twice a day,  __________ nasal spray, Seroquel 100 mg at night, Synthroid 75 mcg  daily, trazodone 150 mg at night, [name] 6 mg per day, loxapine 10 mg at  night, ProAir 2 puffs as needed.   PHYSICAL EXAMINATION:  Performed and failed to show any acute findings.   LABORATORY DATA:  Results not available in the chart.   MENTAL STATUS EXAM:  Alert, cooperative female, pleasant.  Speech normal  rate, tempo and production.  Mood anxious, upset, missing the husband,  hearing him say Kathie Rhodes, I'm here.  Also voices of mother who is  supportive.  Denies suicidal or homicidal ideation.  Concerned about the  bleeding.   Cognition well-preserved.   ADMISSION DIAGNOSES:  AXIS I:  Schizoaffective disorder.  AXIS II:  No diagnosis.  AXIS III:  Seizure disorder, obesity, chronic back pain, diabetes  mellitus type 2, hypothyroidism.  AXIS IV:  Moderate.  AXIS V:  GAF upon admission 35; highest GAF in the last year 66.   HOSPITAL COURSE:  She was admitted and started in individual and group  psychotherapy.  Did endorse that she hears voices, the dead husband on  and off but has not told her physician.  Claims bleeding from the rectum  and the vagina.  On 02/24/2005, husband died.  Hears the voices  of the mother and the husband.  Becomes very upset when talking about  the death of her husband.  Three of her own children adopted out.  No  alcohol, no substances.  The last three months she claims medications  have not been changed.  Multiple inpatient stays until 2005.  Living in  [place].  Significant other has power of attorney.  On November 30, 2006,  she felt better.  Did sleep better.  Still worrying about her legs, some  swelling, some other physical concerns but no evidence of the delusional  material that she was expressing upon admission.  She was wanting to go  home.  On December 01, 2006, endorsed no hallucinations.  As she was indeed  improved, there were no suicidal or homicidal ideation, no  hallucinations or delusions, we went ahead and discharged to outpatient  follow-up.   DISCHARGE DIAGNOSES:  AXIS I:  Schizoaffective disorder.  AXIS II:  No diagnosis.  AXIS III:  History of seizures, morbid obesity, chronic back pain,  diabetes mellitus type 2, hypothyroidism.  AXIS IV:  Moderate.  AXIS V:  GAF upon discharge 50.   DISCHARGE MEDICATIONS:  1. Aspirin 81 mg per day.  2. Albuterol inhaler as needed.  3. Depakote ER 500 mg, 2 tablets at bedtime.  4. Protonix 40 mg twice a day.  5. Synthroid 75 mcg daily.  6. Lisinopril 20 mg twice a day.  7. Trazodone 100 mg, 1-1/2 at night.  8. [name]  6 mg in the morning.  9. Loxitane 10 mg at night.  10.Nasonex 2 sprays in each nostril daily.  11.Metoprolol 50 mg in the morning and at night.  12.Seroquel 200 mg at bedtime.  13.Cogentin 1 mg at bedtime.   FOLLOWUP:  Dr. Lang Snow at [facility].      Geoffery Lyons, M.D.  Electronically Signed     IL/MEDQ  D:  12/21/2006  T:  12/22/2006  Job:  [id number]

## 2010-11-05 NOTE — Procedures (Signed)
[facility]  Patient:    [name], [name]                        MRN: [id number] Proc. Date: 03/15/00 Adm. Date:  [id number] Disc. Date: [id number] Attending:  Osvaldo Human CC:         Crista Luria, M.D.   Procedure Report  PROCEDURE:  Esophagogastroduodenoscopy with Savary dilation over a guidewire and fluoroscopy.  ENDOSCOPIST:  Venita Lick. Pleas Koch., M.D.-[facility]  REFERRING PHYSICIAN:  Crista Luria, M.D.  INDICATION:  A 60 year old female with chronic reflux symptoms and intermittent solid food dysphagia.  Her symptoms have not responded to a course of Aciphex.  PHYSICAL EXAMINATION:  CHEST: Clear to auscultation.  CARDIAC: Regular rate and rhythm without murmurs.  NEUROLOGIC: Alert and oriented x3.  ANESTHESIA:  Fentanyl 100 mcg IV, Versed 10 mg IV and pharyngeal Cetacaine spray.  MONITORING:  Automated blood pressure monitor, pulse oximeter and cardiac monitor.  Low flow oxygen was given by nasal cannula throughout the procedure.  The procedure was well tolerated with no immediate complications.  PROCEDURE: After the nature of the procedure was discussed with the patient including discussion of its risks, benefits and alternatives, she consented to proceed.  She was then comfortably sedated in the left lateral decubitus position.  The Olympus videoendoscope was inserted in the posterior pharynx and the esophagus was intubated under direct visualization.  The esophagus had an entirely normal appearance with the Z line measured at 30 cm from the incisors.  There was a question of a small sliding hiatal hernia, but I could not confirm this on repeat evaluation.  Examination of the gastric body appeared unremarkable.  Retroflex view of the angularis, lesser curvature, cardia and fundus appeared normal.  The gastric antrum, gastric pylorus, duodenal bulb and descending duodenum all appeared unremarkable.  I withdrew the endoscope into  the esophagus and again carefully evaluated for any evidence of a stricture, esophagitis or a hiatal hernia, I could not document any abnormal findings given her dysphagia symptoms.  I did proceed with a dilation.  An esophagus dilator was placed in the gastric antrum the endoscope was then removed over the guidewire under fluoroscopic control.  A 17 mm Savary dilator was passed over the guidewire and across the esophagogastric junction with no resistance noted.  The dilator and guidewire were removed together from the patient.  IMPRESSION: 1. Normal upper endoscopy. 2. Savary dilation performed. 3. Fluoroscopy for dilation.  RECOMMENDATIONS: 1. No abnormal findings on this examination.  Would continue a Proton Pump    Inhibitor along with antireflux measures for management of presumed GERD.    Access response to dilation.  Consider other etiologies of her chest symptoms if they do not improve.  Also consider esophageal manometry studies and a 24 hour PH study if her symptoms continue. 2. Ongoing follow-up with Dr. Crista Luria. DD:  03/15/00 TD:  03/15/00 Job: [id number]

## 2010-11-05 NOTE — Discharge Summary (Signed)
NAME:  [name], [name]                         ACCOUNT NO.:  [id number]   MEDICAL RECORD NO.:  [id number]                   PATIENT TYPE:  IPS   LOCATION:  0400                                 FACILITY[facility]  [facility]   PHYSICIAN:  Jeanice Lim, M.D.              DATE OF BIRTH:  1951-01-19   DATE OF ADMISSION:  11/08/2002  DATE OF DISCHARGE:  11/20/2002                                 DISCHARGE SUMMARY   IDENTIFYING DATA:  This is a patient well known to [facility],  admitted multiple times due to a somewhat refractory illness as well as  noncompliance with medications and occasional alcohol abuse and unstable  living situation.  Patient was petitioned by [facility];  apparently had been calling 911 greater than 24 times in 24 hours looking  for her husband, whom she believed was a Nurse, adult, believed she was [employer],  clearly delusional.   MEDICATIONS:  1. Abilify.  2. Risperdal.  3. Wellbutrin.   DRUG ALLERGIES:  PENICILLIN.   PHYSICAL EXAMINATION:  GENERAL:  Physical exam negative.  NEUROLOGICAL:  Nonfocal.   LABORATORY DATA:  Negative pregnancy.   MENTAL STATUS EXAM:  Obese, calm, pleasant, cooperative female, a bit  irritable at times, guarded.  Speech within normal limits.  Mood, mild  irritability.  Thought process positive for delusions.  No suicidal or  homicidal ideation.  Patient was paranoid, cognitively intact, believed that  she was here for OB/GYN problems and that she was here on [employer] business,  clearly delusional with impaired judgment and insight.   ADMISSION DIAGNOSES:   AXIS I:  Schizo-affective disorder.   AXIS II:  None.   AXIS III:  None.   AXIS IV:  1. Severe problems with primary support group.  2. Limited limitations.  3. Financial stress.   AXIS V:  12/50 to 55.   HOSPITAL COURSE:  Patient was admitted, ordered routine p.r.n. medications,  underwent further monitoring, was encouraged to participate in the  individual, group, and milieu therapy.  Patient was resumed on previous  psychotropics, which were gradually optimized to stabilize patient's  psychosis.  Patient gradually responded to optimizing Risperdal, continued  to believe this person in the police was her husband, and she wanted to go  live with him; loxapine was then added.  Patient reported gradually doing  better as she was stabilized on medications.  She had improvement in  judgment and insight and reality testing and was discharged in improved  condition.   DISCHARGE MEDICATIONS:  1. Risperdal 1 mg b.i.d. and 3 mg q.h.s.  2. Loxapine 10 mg t.i.d.  3. Depakote ER 500 mg two q.h.s. and Depakote ER 250 mg one q.h.s.   FOLLOW UP:  Patient is to follow up at [facility].  ACT team is to be involved.  Appointment is November 28, 2002, at 10:30 a.m.   DISCHARGE DIAGNOSES:  AXIS I:  Schizo-affective disorder.   AXIS II:  None.   AXIS III:  None.   AXIS IV:  1. Severe problems with primary support group.  2. Limited limitations.  3. Financial stress.   AXIS V:  Global assessment of functioning on discharge was 50.                                               Jeanice Lim, M.D.    JEM/MEDQ  D:  01/01/2003  T:  01/03/2003  Job:  [id number]

## 2010-11-05 NOTE — H&P (Signed)
[facility]  Patient:    [name], [name]                        MRN: [id number] Adm. Date:  [id number] Attending:  Jasmine Pang Dictator:   Young Berry Lorin Picket, N.P.                   Psychiatric Admission Assessment  DATE OF ADMISSION:  Nov 10, 2000.  IDENTIFYING INFORMATION:  This is a 60 year old Caucasian female who is married, voluntary admission for suicidal ideation with a plan to shoot herself and shoot her husband.  REASON FOR ADMISSION AND SYMPTOMS:  This patient with a history of paranoid schizophrenia and borderline personality disorder and mild mental retardation reports that "I just wanted to shoot myself and my husband.  I had drunk 4 beers and we were arguing."  "When I get out of here I just want to get some help for [place] at home."  Patient reports that she has been caring for her husband for the last 8 years who is on renal dialysis and is disabled, although she states he is up and ambulatory.  Patient reports gradual increase over the past 2 months in irritability, poor sleep, with initial insomnia and frequent awakenings, and she is very frustrated with the care demands of her husband and her household.  She also states that she believes that she has cancer.  She recently went to a physicians appointment and had a PAP smear and she was not told that she had cancer, but she was told that something didnt look good.  She is vague about follow up appointments, but insists that she is going to [facility] clinics for further evaluation.  She has not been told that she has diagnosis of cancer at this point.  Patient also was told that she does have degenerative disk disease in her back and that she fears that this is going to kill her and seems convinced that it will.  Patient has other somatic complaints regarding osteoarthritis pains in her legs and arms. Patient denies any auditory or visual hallucinations.  She does continue to have some  suicidal thought but no plan or intent and is able to promise safety on the unit.  PAST PSYCHIATRIC HISTORY:  Patient is followed by Sallye Lat, P.A., and Dr. Vilinda Blanks at [facility].  She has a history of paranoid schizophrenia and borderline personality disorder and a distant history of cutting herself, but no cutting recently.  Admissions include [facility] in February of 2002 and May of 2000.  Patient also history of mild mental retardation; however, she seems to be functioning fairly well on the unit.  SOCIAL HISTORY:  Patient denies any history of physical or sexual abuse.  She lives with her husband of approximately 2 years; however, she has cared for him and known him for about 8 years.  He has been on renal dialysis for 4 years.  Patient states she has a supportive girlfriend who assists her with transportation.  Both the patient and her husband are on disability.  The girlfriend assists with transportation and shopping.  Patient is on food stamps and does not get an SSI check since she is now married.  Both she and her husband are on Medicare and Medicaid.  ALCOHOL AND DRUG HISTORY:  Patient did state that she drank 4 beers today but states that prior to that  she has not drunk beer since she can remember, does not use beer on a regular basis, denies other drug abuse.  PAST MEDICAL HISTORY:  Patients primary care Janeah Kovacich is Denice Core, N.P., at [facility].  Patient states that she is changing to [facility] clinics and plans on getting a complete physical examination there.  Medical problems include arthritis in her legs, lumbar back and arms, degenerative disk disease, hypothyroidism, mild MR and hypertension.  No history of seizures. Medications are HCTZ 12.5 mg p.o. b.i.d., Zestoretic 20/25 q.d., Wellbutrin SR 150 p.o. q.a.m., Risperdal 2 mg p.o. q.a.m. and h.s., Klonopin 0.5 mg p.o. q.h.s., Trazodone 100 mg p.o.  q.h.s., Estradiol patch 0.5 mg q.24h. biweekly, Claritin 12 hour tablets 1 b.i.d., Prevacid 30 mg p.o. b.i.d., Vioxx 25 mg p.o. q.d. and Levothroid 0.05 mg p.o. q.a.m.  DRUG ALLERGIES:  Unknown.  POSITIVE PHYSICAL FINDINGS:  Physical examination is currently pending.  Labs indicated that her potassium was low at 3.1, BUN less than 5, alk phos 38. Vital signs on admission, temp 97.1, pulse 101, respirations 16, blood pressure 129/74.  Vital signs this morning, pulse is lowered to 91 and blood pressure is 135/90, temperature 96.7.  MENTAL STATUS EXAMINATION:  This is an obese short female with long dark hair. She is pleasant and cooperative.  Her affect is a bit blunted and at times incongruently smiling with a faraway look throughout the discussion of her stressors.  She does seem a bit detached at times, but she is cooperative and generally pleasant.  Speech is normal in pace and tone, no pressure noted. Mood seems mildly depressed.  Thought process is positive for vaguely paranoid ideation.  At minimal, the patient is catastrophising some news that the physician has given her.  At most, she does have some vague paranoid ideation related to her health care.  She is positive for some suicidal ideation; however, she is able to promise safety.  She notes no homicidal ideation today and no auditory or visual hallucinations are noted.  Her intelligence is just below average.  Her judgment is fair, insight poor, impulse control poor. Cognitively, she is oriented x 3 and is intact.  ADMISSION DIAGNOSES: Axis I:    Schizophrenia, paranoid type with acute exacerbation. Axis II:   Mild mental retardation, borderline personality disorder by            history. Axis III:  Hypertension, osteoarthritis, hypothyroidism, degenerative disk            disease. Axis V:    Moderate problems with the primary support group, particularly her            relationship with her husband, and moderate medical  problems            related to her unconfirmed GYN disorders. Axis V:  INITIAL PLAN OF CARE:  To admit the patient to stabilize her mood.  Q.15  minute checks are in place.  We will increase her Risperdal at h.s. and we will start her on some K-Dur 20 meq daily.  We will do special visitations for her husband and her friend, and we will restart all the rest of her previous medications.  Labs and full PE are pending.  We are checking her thyroid, a urine drug screen, and we have already done a CMET on her and we will recheck her electrolytes in a day or two after she has some potassium supplementation.  TENTATIVE LENGTH OF STAY:  Four to five days.  DD:  11/11/00 TD:  11/11/00 Job: [id number]

## 2010-11-05 NOTE — Discharge Summary (Signed)
[facility]  Patient:    RANDIE, BLOODGOOD Visit Number: [id number] MRN: [id number]          Service Type: EMS Location: ED Attending Physician:  Devoria Albe Dictated by:   Gertha Calkin, M.D. Admit Date:  07/07/2001 Discharge Date: 07/07/2001   CC:         [facility]  Myles Rosenthal, M.D.   Discharge Summary  DISCHARGE DIAGNOSES:  1. Chest pain, rule out myocardial infarction.  2. History of paranoid schizophrenia.  3. Depression.  4. History of hysterectomy.  5. History of hiatal hernia status post EGD done March 15, 2000.  6. Degenerative joint disease/arthritis.  7. Migraine headaches.  8. Deafness.  9. Hypertension. 10. Hypothyroidism.  DISCHARGE MEDICATIONS:  Note:  There are no new medications added.  1. Metoprolol 50 mg p.o. q.d.  2. Zyrtec 10 mg p.o. q.d.  3. Klonopin 1 mg p.o. q.d.  4. Hydrochlorothiazide 12.5 mg p.o. q.d.  5. Synthroid 50 mcg p.o. q.d.  6. Protonix 40 mg p.o. q.h.s.  7. Wellbutrin 150 mg p.o. q.d.  8. Risperdal 2 mg p.o. q.a.m. and 4 mg p.o. q.h.s.  9. Trazodone 200 mg p.o. q.h.s. 10. Ambien 5 mg p.o. q.h.s. p.r.n. insomnia. 11. Milk of Magnesia p.r.n. constipation. 12. Sublingual nitroglycerin 0.4 mg p.o. to be placed under the tongue q.5     minutes x3 doses maximum, then notify M.D.  Elenor Quinones:  Dr. Alberteen Sam at the [facility] [phone]). Please call for next available appointment in Dr. Irving Burton clinic when discharged from [facility] or have Behavioral Health set up an appointment before discharge.  Follow-up is for routine follow-up.  No specific laboratories are needed.  PROCEDURES/DIAGNOSTIC STUDIES:  She ruled out with cardiac enzymes and no new EKG findings.  CONSULTANTS:  Psychiatry was consulted, I believe, with Dr. Dayton Scrape, who has seen the patient for the consult while she has been here at [facility].  BRIEF HISTORY AND PHYSICAL:  Ms. Ferrebee  was brought in on July 07, 2001 complaining of chest discomfort.  She is a 60 year old white female with history of paranoid schizophrenia.  Presented to [facility] ER and then brought over by EMS ______ at [facility] complaining of chest pain.  Pain started on ______ stabbing in nature, radiating to both arms, lasted for about 45 minutes to an hour, was 8-10 in intensity, worsened by cough and deep breaths.  Had partial relief with nitroglycerin x2 in [facility] ED.  Associated with nausea, vomiting, dyspnea, sweating, palpitations, and feeling light-headed.  She felt it to be like heartburn.  She had ham and cheese before chest pain started.  She complained of tingling in her arm and history of URI with ER visit November 2002 at which point she had positive cough and fevers as well as chills over the past month previous.  She says she had heart failure a year ago and was admitted to [facility].  However, at that time no procedures were done.  Her initial laboratories on her admission were normal white blood count of 7, hemoglobin 13.5, platelets 296,000, MCV 96. Only aberrant basic metabolic panel laboratory was glucose at 113, slightly elevated.  Potassium was 3.9, BUN 10, creatinine 0.7.  LFTs were all within normal limits.  As well, CK-MBs as I mentioned earlier were negative x3. Fasting lipid panel showed only low HDL level of 38.  Otherwise, all other laboratories were normal.  Beta hCG also came  back negative as the patient was also complaining of being pregnant despite her hysterectomy.  HOSPITAL COURSE:  CHEST PAIN:  Ruled out by enzymes.  Nothing further was pursued.  Psychiatric consult was obtained hospital day #2 at which point she was evaluated and was noted to need institutional care for her psychosis. After further questioning the patient stated to the psychiatrist on-call on Sunday that she had quit taking her medicines for the last two months. Patient is  stable at this point and is ready to be transferred to [facility].  ACTIVITY:  As tolerated.  DIET:  Low salt secondary to her being hypertensive.  WOUND CARE:  None necessary.  SPECIAL INSTRUCTIONS:  None other than Behavioral Health or patient to call the [facility] at [phone] to get the next available continuity clinic appointment with Dr. Alberteen Sam. Dictated by:   Gertha Calkin, M.D. Attending Physician:  Devoria Albe DD:  07/09/01 TD:  07/09/01 Job: [id number]

## 2010-11-05 NOTE — Discharge Summary (Signed)
[facility]  Patient:    [name], [name] Visit Number: [id number] MRN: [id number]          Service Type: EXP Location: [facility] Attending Physician:  Cathren Laine Dictated by:   Jeanice Lim, M.D. Admit Date:  01/08/2002 Discharge Date: 01/08/2002                             Discharge Summary  IDENTIFYING DATA:  This is a 60 year old married Caucasian female, voluntarily admitted for suicidal ideation and psychosis.  ADMISSION MEDICATIONS:  Claritin, trazodone, Risperdal, and Klonopin.  ALLERGIES:  PENICILLIN.  PHYSICAL EXAMINATION:  Within normal limits, neurologically nonfocal.  ROUTINE ADMISSION LABORATORIES:  Urine drug screen negative. Alcohol level 74. Urinalysis negative.  CMET essentially within normal limits.  MENTAL STATUS EXAMINATION:  Alert, overweight Caucasian female, uncooperative. Speech was clear, mood mostly euthymic, affect restricted, thought processes are goal directed.  Thought content negative for dangerous ideation or psychotic symptoms.  Cognitively, intact.  Judgment and insight poor.  ADMISSION DIAGNOSES: Axis I:    Schizophrenia. Axis II:   None. Axis III:  Diabetes type 2, coronary artery disease and hypertension,            gastroesophageal reflux disease. Axis IV:   Moderate, problems with primary support. Axis V:    35/65.  HOSPITAL COURSE:  The patient was admitted and ordered routine p.r.n. medications, CBGs b.i.d.  The patient admitted to alcohol use prior to admission and noncompliance with medications.  The patient was restabilized on medications, restarting Risperdal 2 mg q.a.m. and 4 mg q.h.s., Klonopin 1 mg q.h.s., Wellbutrin SR 150 mg q.a.m.  The patient rapidly stabilized on medications.  The importance of compliance was repeatedly reviewed with the patient as well as abstinence from alcohol.  CONDITION ON DISCHARGE:  Markedly improved.  Mood was more euthymic, affect bright, thought process  goal directed.  Thought content negative for delusions or psychotic symptoms or dangerous ideation.  DISCHARGE MEDICATIONS: 1. Trazodone 100 mg 2-1/2 q.h.s. 2. Risperdal 2 mg 1 q.a.m. and 2 q.h.s. 3. Klonopin 1 mg q.h.s. 4. Wellbutrin SR 150 mg q.a.m. 5. Pepcid 20 mg q.h.s.  DISPOSITION:  The patient was to follow up with [facility] on June 26 at 9:30 a.m. and follow up with her primary care physician.  DISCHARGE DIAGNOSES: Axis I:    Schizophrenia. Axis II:   None. Axis III:  Diabetes type 2, coronary artery disease and hypertension,            gastroesophageal reflux disease. Axis IV:   Moderate, problems with primary support. Axis V:    Global assessment of function on discharge 55. Dictated by:   Jeanice Lim, M.D. Attending Physician:  Cathren Laine DD:  01/09/02 TD:  01/11/02 Job: [id number]
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
## 2010-11-05 NOTE — Discharge Summary (Signed)
[name], [name]               ACCOUNT NO.:  [id number]   MEDICAL RECORD NO.:  [id number]          PATIENT TYPE:  OBV   LOCATION:  2917                         FACILITY:  [facility]   PHYSICIAN:  Ricki Rodriguez, M.D.  DATE OF BIRTH:  1950/08/25   DATE OF ADMISSION:  01/28/2006  DATE OF DISCHARGE:  01/30/2006                                 DISCHARGE SUMMARY   PATIENT ADMITTED:  By Dr. Orpah Cobb.   PATIENT REFERRED BY:  Dr. Rinaldo Cloud.   PRINCIPAL DIAGNOSES:  1. Noncardiac chest pain.  2. Diabetes mellitus type 2.  3. Hypertension.  4. Paranoid schizophrenia.  5. Depression.   DISCHARGE MEDICATIONS:  1. Aspirin 81 mg one daily.  2. Depakote 500 mg two at bedtime.  3. Seroquel 100 mg two at bedtime.  4. Lisinopril 20 mg one daily.  5. Nexium or omeprazole 40 mg daily.  6. Nitrostat 0.4 mg one sublingual every 5 minutes x3 as needed for chest      pain.  7. Trazodone 150 mg at bedtime.  8. Risperdal 4 mg at bedtime.  9. Metoprolol 50 mg half a tablet twice daily.  10.Folic acid 1 mg daily.  11.Synthroid 75 mcg one daily.  12.Benztropine 0.5 mg twice daily.  13.Loxapine 10 mg one daily in the morning.  14.Tylenol 325 mg two tablet up to 4 times daily as needed for pain.   DISCHARGE DIET:  Low-fat, low-salt diabetic diet.   ACTIVITY:  Patient to increase activity slowly.   FOLLOWUP:  By Dr. Rinaldo Cloud in 2 weeks.  Patient to call [phone] for an  appointment.   HISTORY:  This 60 year old white female, patient had severe chest pain,  recurrent, radiating to left arm, along with shortness of breath and  sweating spells.  Had no relief with sublingual nitroglycerin at home or by  EMS or by emergency room nurse.  Patient's sister died one day ago.   PAST MEDICAL HISTORY:  Positive for:  1. Diabetes.  2. Hypertension.  3. Depression.  4. Paranoid schizophrenia.   PHYSICAL EXAMINATION:  VITAL SIGNS:  Temperature 98.2.  Pulse 75.  Respirations 20.  Blood  pressure 98/58.  Height 5 feet 2 inches.  Weight 303  pounds estimated.  GENERAL:  Patient is well-built, well-nourished, obese female in no  significant distress.  HEENT:  Patient is normocephalic, atraumatic with pupil equally reacting to  light.  Extraocular movement intact.  Conjunctivae pink.  Sclerae are  nonicteric.  NECK:  No JVD.  No carotid bruit.  LUNGS:  Clear bilaterally.  CHEST WALL:  Nontender.  HEART:  Normal S1, S2.  ABDOMEN:  Distended, but nontender.  EXTREMITIES:  No edema, cyanosis or clubbing.  SKIN:  Warm and dry.  CNS:  Cranial nerves grossly intact.  Patient moves all 4 extremities.   LABORATORY DATA:  Revealed a normal hemoglobin, hematocrit.  Electrolytes  are normal.  Glucose 103.  Cardiac enzymes unremarkable.  Lipid profile  unremarkable.  Nuclear stress test, although nondiagnostic, appeared to be  reasonable for no significant ischemia.  EKG sinus rhythm with nonspecific T-  wave changes.   HOSPITAL COURSE:  Patient was admitted to telemetry unit, myocardial  infarction was ruled out.  She underwent a nuclear stress test that had some  motion artifact; however, it was without any significant ischemia.  Most of  her chest pain appeared to be musculoskeletal anyway and she was discharged  home in satisfactory condition with a followup by Dr. Sharyn Lull in 2 weeks.      Ricki Rodriguez, M.D.  Electronically Signed     ASK/MEDQ  D:  01/30/2006  T:  01/30/2006  Job:  [id number]
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
## 2010-11-05 NOTE — Discharge Summary (Signed)
NAME:  [name], [name]                         ACCOUNT NO.:  [id number]   MEDICAL RECORD NO.:  [id number]                   PATIENT TYPE:  INP   LOCATION:  3734                                 FACILITY:  [facility]   PHYSICIAN:  Thayer Headings, M.D.               DATE OF BIRTH:  10-Jul-1950   DATE OF ADMISSION:  02/20/2002  DATE OF DISCHARGE:  02/22/2002                                 DISCHARGE SUMMARY   DISCHARGE DIAGNOSES:  1. Chest pain, deemed noncardiac by adenosine Cardiolite.  2. Hypertension.  3. Paranoid schizophrenia.  4. Depression.  5. History of hypothyroidism.  6. Osteoarthritis.  7. Migraine headaches.  8. Status post hysterectomy in 1993.   DISCHARGE MEDICATIONS:  1. Aspirin 81 mg q.d.  2. Prilosec 20 mg q.d.  3. Risperdal 2 mg q.a.m. and 4 mg q.p.m.  4. Wellbutrin 150 mg q.a.m.  5. Trazodone 150 mg q.h.s.  6. Metoprolol 25 mg b.i.d.   DISPOSITION:  The patient is discharged to home with family.   FOLLOW UP:  The patient will follow up with [facility].  [facility] is  closed at the time of discharge. The patient will contact [facility] for an  appointment in the next one or two weeks.   CONSULTATION:  Duke Salvia, M.D., cardiology.   HISTORY OF PRESENT ILLNESS:  The patient is a 60 year old white female with  complaints of sudden onset chest pain on the day of admission while sitting  in a chair and was located in the left upper chest.  It was associated with  shortness of breath, nausea and vomiting two times and diaphoresis, no  radiation. She described it as a sharp, stabbing pain. The pain felt  lessened by nitroglycerin but was still felt when seen in the ER.  She  denied pleuritic pain. She reports chest pain with exertion over the last  three weeks. She walks for exercise and gets chest pain with shortness of  breath with walking. The pain usually lasts about 30 minutes after the rest  starts and then goes away.  She thinks the pain has gotten  worse over the  last three weeks and then today was bad enough to come to the hospital. She  says she had an MRI last February of 2003 at [facility]  but I cannot corroborate with medical records.  Her risk factors include  family history, tobacco, question of hypertension, negative lipids, and  negative diabetes.   ALLERGIES:  PENICILLIN causes hives.   PAST MEDICAL HISTORY:  1. Paranoid schizophrenia.  2. Depression.  3. Status post hysterectomy in 1993.  4. History of hiatal hernia, status post EGD in September 2001.  5. Osteoarthritis.  6. Migraine headaches.  7. Hypertension.  8. Hypothyroidism.  9. Partial deafness.  10.      Question coronary artery disease.   MEDICATIONS:  1. Nexium 20 mg q.d. p.r.n.  2. Risperdal 2 mg q.a.m. and 4 mg q.h.s.  3. Clonazepam 1 mg q.d. p.r.n.  4. Trazodone 150 mg q.h.s.  5. Wellbutrin SR 150 mg q.a.m.  6. Prednisone 20 mg p.r.n. for  bee stings.  7. X-lax.   SOCIAL HISTORY:  Current smoker, half pack per day x30 years.  She drinks  socially but says she was an alcohol abuser in the past.  She is married.   FAMILY HISTORY:  Her mother is alive, age 74, had an MI in her 22s.  Her  father is alive at age 36, had MI at age 20.   PHYSICAL EXAMINATION:  GENERAL APPEARANCE:  She is anxious, obese white  female in no acute distress.  VITAL SIGNS: Temperature 98.5, blood pressure 126/71, pulse 90, respiratory  rate 14, saturation 95% on room air.  HEENT:  EYES:  PERRL, EOMI, nonicteric.  ENT: Oropharynx and nasopharynx  moist, no erythema or exudate.  NECK:  Supple, no JVD or bruits.  CHEST:  Clear to auscultation bilaterally.  Decreased breath sounds  bilaterally in the bases from a fairly weak effort.  CARDIOVASCULAR:  Regular rate and rhythm with no murmurs, distant heart  sounds, 2+ pulses in the radial, dorsalis pedis bilaterally.  ABDOMEN:  Soft, mildly tender in the left side.  Normal active bowel sounds  with  no rebound or guarding.  EXTREMITIES:  No edema.  SKIN:  Birthmark on the left neck.  LYMPHS:  No cervical lymphadenopathy.  NEUROLOGIC:  No focal deficits.   LABORATORY DATA:  Sodium 138, potassium 3.0, chloride 103, CO2 28, BUN 4,  creatinine 0.7, glucose 95.  WBC 6.8, hemoglobin 12.4, platelets 262.   HOSPITAL COURSE:  #1 - CHEST PAIN:  The patient's chest pain was very  concerning for crescendo chest pain with unstable angina as she was started  on Lovenox full anticoagulation dose and cardiology was called the night of  her admission.  Dr. Graciela Husbands believed her pain to probably be musculoskeletal  but did believe that a Cardiolite would help answer his question.  An  adenosine Cardiolite was performed which showed no ischemia.  Her cardiac  enzymes were negative.  She had no EKG changes.  Her echocardiogram showed  an EF of 55 to 65% with no focal wall motion abnormalities and mild mitral  regurgitation.  The patient's chest pain stopped after the first day in the  hospital and did not return during the rest of the hospitalization.   #2 - HYPERTENSION:  The patient was noted to have increased blood pressures  while she was in the hospital and metoprolol was started to try to control  this.  She is to continue metoprolol as an outpatient.   DISCHARGE LABS:  Sodium 136, potassium 3.9, chloride 105, CO2 26, glucose  142, BUN 10, creatinine 0.7, calcium 9.6.  WBC 10.2, hemoglobin 13.6,  platelets 273.                                                Thayer Headings, M.D.    BM/MEDQ  D:  02/22/2002  T:  02/25/2002  Job:  [id number]   cc:   [facility]   [facility]

## 2010-11-05 NOTE — Discharge Summary (Signed)
[facility]  Patient:    [name], [name] Visit Number: [id number] MRN: [id number]          Service Type: OBS Location: [facility] Attending Physician:  Michaelle Copas Dictated by:   Jeanice Lim, M.D. 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  MEDICATIONS:  Risperdal.  DRUG ALLERGIES:  PENICILLIN.  PHYSICAL EXAMINATION:  Essentially within normal limits.  Neurologically nonfocal and unremarkable.  MENTAL STATUS EXAMINATION:  Disheveled, edentulous, Caucasian female, calm and and cooperative with an incongruent smile.  Speech within normal limits.  Mood positive for delusions, mild depression and no acute dangerous ideation. Cognitively intact.  Judgment and insight limited.  ADMISSION DIAGNOSES: Axis I:    1. Schizophrenia, paranoid-type.            2. Depression not otherwise specified. Axis II:   None. Axis III:  1. Gastroesophageal reflux disease.            2. Hiatal hernia.            3. Obstructive apnea.            4. Hypertension.            5. Hypothyroidism. Axis IV:   Moderate (problems with primary support and occupational problems). Axis V:    28/60.  HOSPITAL COURSE:  The patient was admitted and ordered routine p.r.n. medications.  Resumed on metoprolol, Zyrtec, Klonopin, hydrochlorothiazide, Synthroid, Protonix, Wellbutrin and Risperdal as well as trazodone. Sublingual nitroglycerin ordered p.r.n. and potassium chloride 20 mEq b.i.d. after meals.  Risperdal was optimized for acute psychotic symptoms and trazodone optimized to restore sleep.  The patient tolerated medications well and rapidly stabilized  with no side effects from medications.  CONDITION ON DISCHARGE:  Markedly improved.  Mood euthymic.  Affect brighter. Thought process goal directed.  Thought content negative for dangerous ideation and no longer delusional.  No hallucinations.  Motivated to be compliant with the medications in the future and abstain from alcohol.  DISCHARGE MEDICATIONS: 1. Levothyroxine 50 mcg q.a.m. 2. Protonix 40 mg q.h.s. 3. Wellbutrin SR 150 mg q.a.m. 4. Risperdal 2 mg q.a.m., 1/2 at 3 p.m. and 2 q.h.s. 5. Lopressor 50 mg q.a.m. 6. Claritin 10 mg q.a.m. 7. Klonopin 1 mg q.a.m. 8. Hydrochlorothiazide 12.5 mg q.a.m. 9. Trazodone 100 mg, 2-1/2 q.h.s.  FOLLOW-UP:  The patient was discharged to follow up with the [facility] for medication management on Friday, August 10, 2001 at 8:30 a.m.  DISCHARGE DIAGNOSES: Axis I:    1. Schizophrenia, paranoid-type.            2. Depression not otherwise specified. Axis II:   None. Axis III:  1. Gastroesophageal reflux disease.            2. Hiatal hernia.            3. Obstructive apnea.            4. Hypertension.            5. Hypothyroidism. Axis IV:   Moderate (problems with primary support and  occupational problems). Axis V:    Global Assessment of Functioning on discharge 50. Dictated by:   Jeanice Lim, M.D. Attending Physician:  Michaelle Copas DD:  09/12/01 TD:  09/12/01 Job: [id number]

## 2010-11-05 NOTE — Discharge Summary (Signed)
[facility]  Patient:    [name], [name]                        MRN: [id number] Adm. Date:  [id number] Disc. Date: [id number] Attending:  Armanda Heritage                           Discharge Summary  INTRODUCTION:  [name] is a 60 year old white widowed female who was admitted after complaining of hearing voices and seeing dead people.  She was committed by [facility], with petition reporting that she was hearing voices and looking for dead people.  She reported also suicidal thoughts recently, and Monday, February 4, she swallowed a handful of unspecified pills.  She had reported suicidal thoughts just prior to admission and at one point her boyfriend had to stop her taking overdose.  Historically, she has had multiple psychiatric hospitalization in the past, being treated in [place], [place], [facility], [facility], [place]. She is seeing Dr. Gwyndolyn Kaufman at [facility].  Patient has history of several suicidal attempts by overdosing and cutting her wrists. Through the years, patient has had history of paranoid schizophrenia.  HOSPITAL COURSE:  After admitting to the ward, patient was placed on special observation.  Her previous medications were reintroduced, including blood pressure medication hydrochlorothiazide, and thyroid medication Levothroid. She was also on Vioxx for arthritis, Claritin for allergies, Prevacid for peptic ulcer disease.  Wellbutrin SR was restarted in dose of 150 mg in the morning.  Trazodone was used at night and Risperdal was reintroduced in dose of 6 mg daily.  On February 15, she felt much better, having no intentions or thoughts to hurt herself, but she was experiencing some auditory and visual hallucinations.  On February 16, getting better.  On February 17, denying any auditory or visual hallucinations but still being stressed out by home situation.  The  decision was made to discharge patient.  Unfortunately, there was no transportation home.  On February 18, much better, more amicable to relationship with boyfriend, who unfortunately was unable to attend family session.  I had chance to talk to Leonette Most, who felt that patient is doing much better, being ready for discharge home.  It was shared by patient who still had some delusional beliefs related to her relationship, but no dangerous ideations or intentions were present.  Patient did not experience any side effects from medication.  DISCHARGE DIAGNOSES: Axis I:     1. Schizophrenia, paranoid type, acute exacerbation, chronic.             2. History of alcohol abuse. Axis II:    Deferred. Axis III:   Hypertension, hypothyroidism, hiatal hernia, arthritis. Axis IV:    Severe stressors, domestic problems, chronic illness. Axis V:     Global assessment of function upon admission 30, highest for             past year 60, upon discharge between 50-55.  MEDICAL PROBLEMS:  During this brief hospital stay, patient did not encounter any major medical problems.  Vital signs were stable, with blood pressure 136/84, 157/98, normal pulse, temperature, and respiration rate.  REVIEW OF BLOOD WORK:  Showed normal CBC, normal value of Chem-8.  DISCHARGE RECOMMENDATIONS:  Patient was discharged on following medications: 1. Wellbutrin SR 150 mg in the morning. 2. Risperdal 2 mg 1 in the morning and  2 at night. 3. Trazodone 200 mg at night. 4. Klonopin 1 mg at night. 5. Prevacid 30 mg twice a day. 6. Claritin 10 mg in the morning. 7. Levothroid 0.05 mg daily. 8. Vioxx 25 mg in the morning.  She should watch her diet and check with family doctor for thyroid follow up. She should call me for deterioration of psychiatric symptoms or recurrence of suicidal thoughts.  At the time of discharge she did not have any side effects from medications.  Follow up with mental health center on February 26, at  1 p.m. DD:  09/03/00 TD:  09/03/00 Job: [id number]
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
## 2010-11-05 NOTE — H&P (Signed)
[facility]  Patient:    [name], [name]                      MRN: [id number] Adm. Date:  [id number] Disc. Date: [id number] Attending:  Denny Peon Dictator:   [name], R.N., F.N.P.                   Psychiatric Admission Assessment  DATE OF ADMISSION:  January 01, 2001  PATIENT IDENTIFICATION:  This is a 60 year old Caucasian female who is voluntary.  She is a transfer from the [facility] Emergency Room after having suicidal thoughts and reporting that she was seeing demons and dead people.  HISTORY OF PRESENT ILLNESS:  The patient with history of schizophrenia, paranoid type.  Reports that she drank 12 beers yesterday and was worrying about her husband who is chronically ill with renal failure and also admitted to the hospital.  The patient reports that she began having suicidal thoughts with no specific intent but she did have a plan developed that she would overdose on her medications.  She presented herself to the emergency room and was cleared for admission and transferred to [facility].  The patient states that her other social stressor has been that she has been having an affair with another man and that her husband is unaware of this and she feels quite guilty about this.  She seems to have resolved herself that she is going to tell this other man that the affair is over with but she has not yet done this.  The patient denies any homicidal ideation.  She denies any suicidal ideation today although does admit that she felt suicidal yesterday. She has no auditory hallucinations.  She did have visual hallucinations yesterday but states she does not have any today.  PAST PSYCHIATRIC HISTORY:  The patient is followed by [name], P.A., and Dr. Gwyndolyn [name] at [facility].  The patient has a history of multiple inpatient admissions for ethyl alcohol abuse, borderline personality disorder by history,  and schizophrenia, paranoid type.  Last inpatient stay was at [facility] in May 2002.  SUBSTANCE ABUSE HISTORY:  The patient reports using one half case of beer per day for the past two to four weeks and drank 12 beers yesterday.  She denies any illegal drug abuse.  She does admit to using opiate pain relievers occasionally which are prescribed by [facility] for back pain for her, this resulting her in UDS being positive for opiates.  PAST MEDICAL HISTORY:  The patient is followed at the [facility]. The patient has a history of bilateral tubal ligation in 1978, a partial hysterectomy in 1993.  Current medical problems include hiatal hernia, hypertension, and osteoarthritis.  MEDICATIONS:  1. Wellbutrin 150 mg q.a.m.; there is some question if this is the     appropriate dose but she states that she takes it once daily.  2. Risperdal one tablet in the morning and two tablets at h.s.; she does not     know the dose.  3. Trazodone 100 mg q.h.s.  4. Klonopin is believed to be 0.5 mg two tablets at h.s.  5. Claritin one tablet daily.  6. Prevacid.  7. Estradiol patch daily.  8. Zestoretic 20/25 one tablet p.o. q.d.  9. HCTZ; the patient seems unclear on if she has taken this lately or not. 10. Levothroid 0.05 mg p.o. q.a.m.  DRUG ALLERGIES:  PENICILLIN.  PHYSICAL EXAMINATION:  GENERAL:  The patient was seen in the [facility] Emergency Room on July 15 where her physical examination was negative.  VITAL SIGNS:  On admission to the unit, her temperature was 96.9, pulse 94, respirations 20.  She is 5 feet 3 inches tall and weighs 242 pounds.  Blood pressure 138/86.  LABORATORY DATA:  UDS was positive for opiates.  Ethyl alcohol level was less than 10.  Potassium was 3.2, BUN 3.0.  SOCIAL HISTORY:  The patient married [name] in March 2002.  He has been on dialysis for several years and the patient took him to the hospital yesterday for admission, which was part of the  stress precipitating her suicidal thoughts yesterday.  The patient has three grown children which are on their own.  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  She currently lives in [place] with her husband, [name].  Both the patient and her husband are on Medicare and Medicaid.  She is on food stamps and she has a friend that assists her with transportation and shopping.  Family history is positive with mother who abused alcohol.  MENTAL STATUS EXAMINATION:  This is an obese female of very short stature who is rather unkempt and dirty with long black hair.  She is alert and cooperative with good eye contact.  Speech is spontaneous, normal and relevant.  Mood is fairly subdued and she is mildly depressed.  Thought process is logical and coherent.  She is fairly realistic about the impact of these events on her life and actually shows fairly good insight on the impact that this extramarital affair is going to have on her relationship with her husband.  There is no evidence of suicidal or homicidal ideation today.  She is not having any auditory or visual hallucinations; however, we had initiated adding Seroquel to her therapy last night.  Cognitively, she is oriented x 3 and is intact.  Intelligence is below average and, in fact, she has a history of mild mental retardation.  Insight is fair.  Impulse control is poor.  ADMISSION DIAGNOSES: Axis I:    1. Rule out substance-induced mood disorder.            2. Schizophrenia, paranoid type, stable.            3. Ethyl alcohol abuse, rule out dependence. Axis II:   1. Borderline personality disorder by history.            2. Mild mental retardation. Axis III:  1. Hypertension.            2. Osteoarthritis.            3. Hypothyroidism.            4. Degenerative disk disease.            5. Hypokalemia. Axis IV:   Moderate problems with the primary support  group, specifically her            relationship with her husband and moderate problems related to the             social environment, specifically her use of ethyl alcohol to cope            with stressors. Axis V:    Current 45, past year 60.  INITIAL PLAN OF CARE:  Plan is to admit the patient to stabilize her mood and to detoxify  her from ETOH.  Q.47m. checks are in place for safety.  We will repeat her electrolytes on July 16 and 18.  We will restart her Wellbutrin SR and her Risperdal and we will add a small dose of Seroquel at h.s. to which she has actually had a good response so far on the first night she was here. We will also restart her routine medications.  We will initiate Librium low dose protocol on her and start her on K-Dur 20 mEq b.i.d. for her hypokalemia.  ESTIMATED LENGTH OF STAY:  Two to four days. DD:  01/02/01 TD:  01/03/01 Job: [id number]
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
## 2010-11-05 NOTE — Discharge Summary (Signed)
[name], [name]               ACCOUNT NO.:  [id number]   MEDICAL RECORD NO.:  [id number]          PATIENT TYPE:  OBV   LOCATION:  2807                         FACILITY:  [facility]   PHYSICIAN:  Ricki Rodriguez, M.D.  DATE OF BIRTH:  [birth year]   DATE OF ADMISSION:  02/21/2006  DATE OF DISCHARGE:  02/21/2006                                 DISCHARGE SUMMARY   PRINCIPAL DIAGNOSES:  1. Noncardiac chest pain.  2. Hypertension.  3. Bipolar disorder.   DISCHARGE MEDICATIONS:  1. Protonix 40 mg 1 daily.  2. Lisinopril 10 mg 1 daily.  3. Doxylamine succinate 10 mg 1 daily.  4. Nexium 40 mg 1 daily.  5. Depakote 1000 mg at bedtime.  6. Risperdal 4 mg at bedtime.  7. Seroquel 100 mg at bedtime.  8. Synthroid 75 mcg 1 daily.  9. Folate 1 mg 1 daily.  10.Trazodone 150 mg at bedtime.  11.Benztropine 0.5 mg one daily.  12.Aspirin 81 mg 1 daily.  13.Lopressor 25 mg 1 twice daily.   DISCHARGE DIET:  Heart-healthy diet.   The patient will notify right groin pain, swelling or discharge.   DISCHARGE ACTIVITY:  The patient to include activity slowly.   FOLLOWUP:  Followup by Dr. Hal Hope at [facility].  The patient to call [phone] for followup appointment.   HISTORY:  This a 60 year old American [place] female had chest pain,  recurrent, radiating into the left arm without shortness of breath or  sweating spell.  The patient had no relief with sublingual nitroglycerin.   PAST MEDICAL HISTORY:  Negative for diabetes.  Positive for hypertension.  Negative for hyperlipidemia.  Positive for paranoid schizophrenia.  Positive  for depression.   PHYSICAL EXAMINATION:  VITAL SIGNS:  Temperature 98.2, pulse 75, respiration  20, blood pressure 93/58.  HEENT:  Grossly unremarkable.  Somewhat obese.  NECK:  Negative JVD.  Negative bruit.  LUNGS:  Clear bilaterally.  HEART:  Normal S1, S2.  ABDOMEN:  Distended, but nontender.  EXTREMITIES:  No edema, cyanosis, or clubbing.  NEUROLOGICAL:   Cranial nerves II-XII are grossly intact.   LABORATORY DATA:  EKG:  Normal sinus rhythm with prolonged QT.  Laboratory  date revealed normal urinalysis.  Normal myoglobin, CK-MB, and troponin-I.  Folate level elevated at 1121.  Lipid profile was unremarkable with LDL  cholesterol of 79.  Electrolytes, BUN, and creatinine were normal.  Hemoglobin and hematocrit were also normal.  WBC count and platelet count  were also normal.  Chest x-ray, mild cardiomegaly, otherwise unremarkable.  Nuclear stress test revealed a small fixed defect at the lateral apex and an  ejection fraction of 54%.   Cardiac catheterization showed normal coronaries and normal LV systolic  function.   HOSPITAL COURSE:  The patient was admitted to the telemetry unit.  Myocardial infarction was ruled out.  Because of her cardiac risk factors  and atypical chest pain, she underwent cardiac catheterization that failed  to show any significant coronary artery disease and left ventricular  systolic function was normal.  The patient was treated medically and  discharged home on February 21, 2006, with followup by her primary doctor.      Ricki Rodriguez, M.D.  Electronically Signed     ASK/MEDQ  D:  04/20/2006  T:  04/21/2006  Job:  [id number]

## 2010-11-05 NOTE — Discharge Summary (Signed)
[facility]  Patient:    [name], [name] Visit Number: [id number] MRN: [id number]          Service Type: EMS Location: ED Attending Physician:  Doug Sou Adm. Date:  [id number] Disc. Date: [id number]                             Discharge Summary  INTRODUCTION:  [name] is a 59 year old white female admitted on a voluntary basis.  She was transferred to [place] from the emergency room of [facility] after having suicidal thoughts and reporting of seeing demons and bad people.  The patient has a long history of schizophrenia.  Prior to admission, she had alcohol abuse, worrying about her husband who is chronically ill, is with renal failure, and admitted to the hospital.  The patient reported suicidal thoughts with no specific plan.  Basically, for the past several days, she has felt like she is falling apart.  Her history is significant that she was followed by Dr. Emmaline Life at [facility], and has history of multiple inpatient admissions for abusing alcohol, schizophrenia, and borderline personality disorder.  The last time she was treated in the [facility] was in May 2002.  ALCOHOL/DRUG HISTORY:  The patient has a history of heavy drinking.  She reported she started using up to half a case of beer for the past 2-4 weeks. Also, she occasionally use opiate pain relievers, but denied abusing them.  MEDICAL HISTORY:  She suffers from being hard of hearing and chronic hypertension, and also arthritis.  MEDICATIONS:  1. Wellbutrin 150 mg in the morning.  2. Risperdal 1 mg in the morning and 2 at night.  3. Klonopin 0.5 mg at bedtime.  4. Trazodone 100 mg at bedtime.  5. Claritin.  6. Prevacid.  7. Estradiol patch.  8. Zestoretic 2/25.  9. Hydrochlorothiazide. 10. Levothroid.  The details of the patients evaluation are available on the chart.  INITIAL DIAGNOSES: Axis I:    Schizophrenia - paranoid type, schizoaffective           disorder - depressed type, rule out substance abuse induced            disorder. Axis II:   Borderline personality disorder by history, borderline intellectual            functioning. Axis III:  Global Assessment of Functioning on admission was 40, maximum for            the past year is 60.  HOSPITAL COURSE:  After admitting to the ward, the patient was placed on special observation.  Her medications were introduced, most basically Risperdal, Klonopin and hydrochlorothiazide.  The patient wanted to be discharged the next day for which she was doing much better than I thought. The patient denied depression and denied suicidal ideation.  On July 17, she was free from suicidal thoughts, denied depression, her affect was bright, no signs of suicidal ideation, and no signs of psychosis.  The decision was made to discharge her home.  Her husband was contacted and he approved the idea of discharge.  REVIEW OF VITAL SIGNS:  Vital signs were stable.  Blood pressure 130/96, normal pulse, temperature, respiration rate.  The patients weight on admission was 242 and her height 5 feet 3 inches.  DISCHARGE DIAGNOSES: Axis I:    1. Schizoaffective disorder - depressed, no recent exacerbation.  2. Alcohol abuse. Axis II:   Borderline personality disorder, borderline intellectual            functioning. Axis III:  Hypertension, hypothyroidism, osteoarthritis, degenerative joint            disease. Axis IV:   Moderate stressors. Axis V:    Global Assessment of Functioning upon admission was 40, and maximum            for the past year was 60.  LABORATORY:  The patient did not have a full workup since she was recently hospitalized on this unit.  Electrolytes were normal with borderline value of sodium 134.  T4 and TSH were normal.  T3 uptake was slightly elevated at 41.5 with normal up to 37.  DISCHARGE RECOMMENDATIONS:  The patient was discharged to follow with [facility].  She is supposed to call or come to the emergency room if recurrence of symptoms or side effects from medications.  At the time of discharge, the patient did not require any special means to deal with side effects of medications.  During this hospitalization, her detox was proceeding very well, but since she wanted to be discharged before discharge was scheduled, Librium 25 mg 6 tablets were given to take in decreasing doses for three days and discontinue.  The patient is supposed to take Klonopin 0.5 at night, Risperdal 2 mg twice a day, levothyroxine 50 mcg 1 daily.  She is supposed to stay on a reasonable diet.  Continue K-Dur 20 mEq daily until electrolytes retract.  She will call if any problems with medications or recurrence of symptoms.  She will follow with [facility] as outlined in discharge instructions.  The patient was discharged home in good condition. DD:  02/03/01 TD:  02/05/01 Job: [id number]

## 2010-11-06 ENCOUNTER — Emergency Department (HOSPITAL_COMMUNITY)
Admission: EM | Admit: 2010-11-06 | Discharge: 2010-11-07 | Discharge status: Against Medical Advice | Payer: Medicare Other | Attending: Emergency Medicine | Admitting: Emergency Medicine

## 2010-11-06 DIAGNOSIS — R109 Unspecified abdominal pain: Secondary | ICD-10-CM | POA: Insufficient documentation

## 2010-11-06 DIAGNOSIS — Z9089 Acquired absence of other organs: Secondary | ICD-10-CM | POA: Insufficient documentation

## 2010-11-06 DIAGNOSIS — I10 Essential (primary) hypertension: Secondary | ICD-10-CM | POA: Insufficient documentation

## 2010-11-06 DIAGNOSIS — I2510 Atherosclerotic heart disease of native coronary artery without angina pectoris: Secondary | ICD-10-CM | POA: Insufficient documentation

## 2010-11-06 DIAGNOSIS — E119 Type 2 diabetes mellitus without complications: Secondary | ICD-10-CM | POA: Insufficient documentation

## 2010-11-06 DIAGNOSIS — G8929 Other chronic pain: Secondary | ICD-10-CM | POA: Insufficient documentation

## 2010-11-06 DIAGNOSIS — M549 Dorsalgia, unspecified: Secondary | ICD-10-CM | POA: Insufficient documentation

## 2010-11-06 IMAGING — CR DG CHEST 1V PORT
1 series · 1 of 1 positions shown · non-contrast
Comparison: 04/26/2009

CLINICAL DATA: Chest pain.

PORTABLE CHEST - 1 VIEW

[AP]
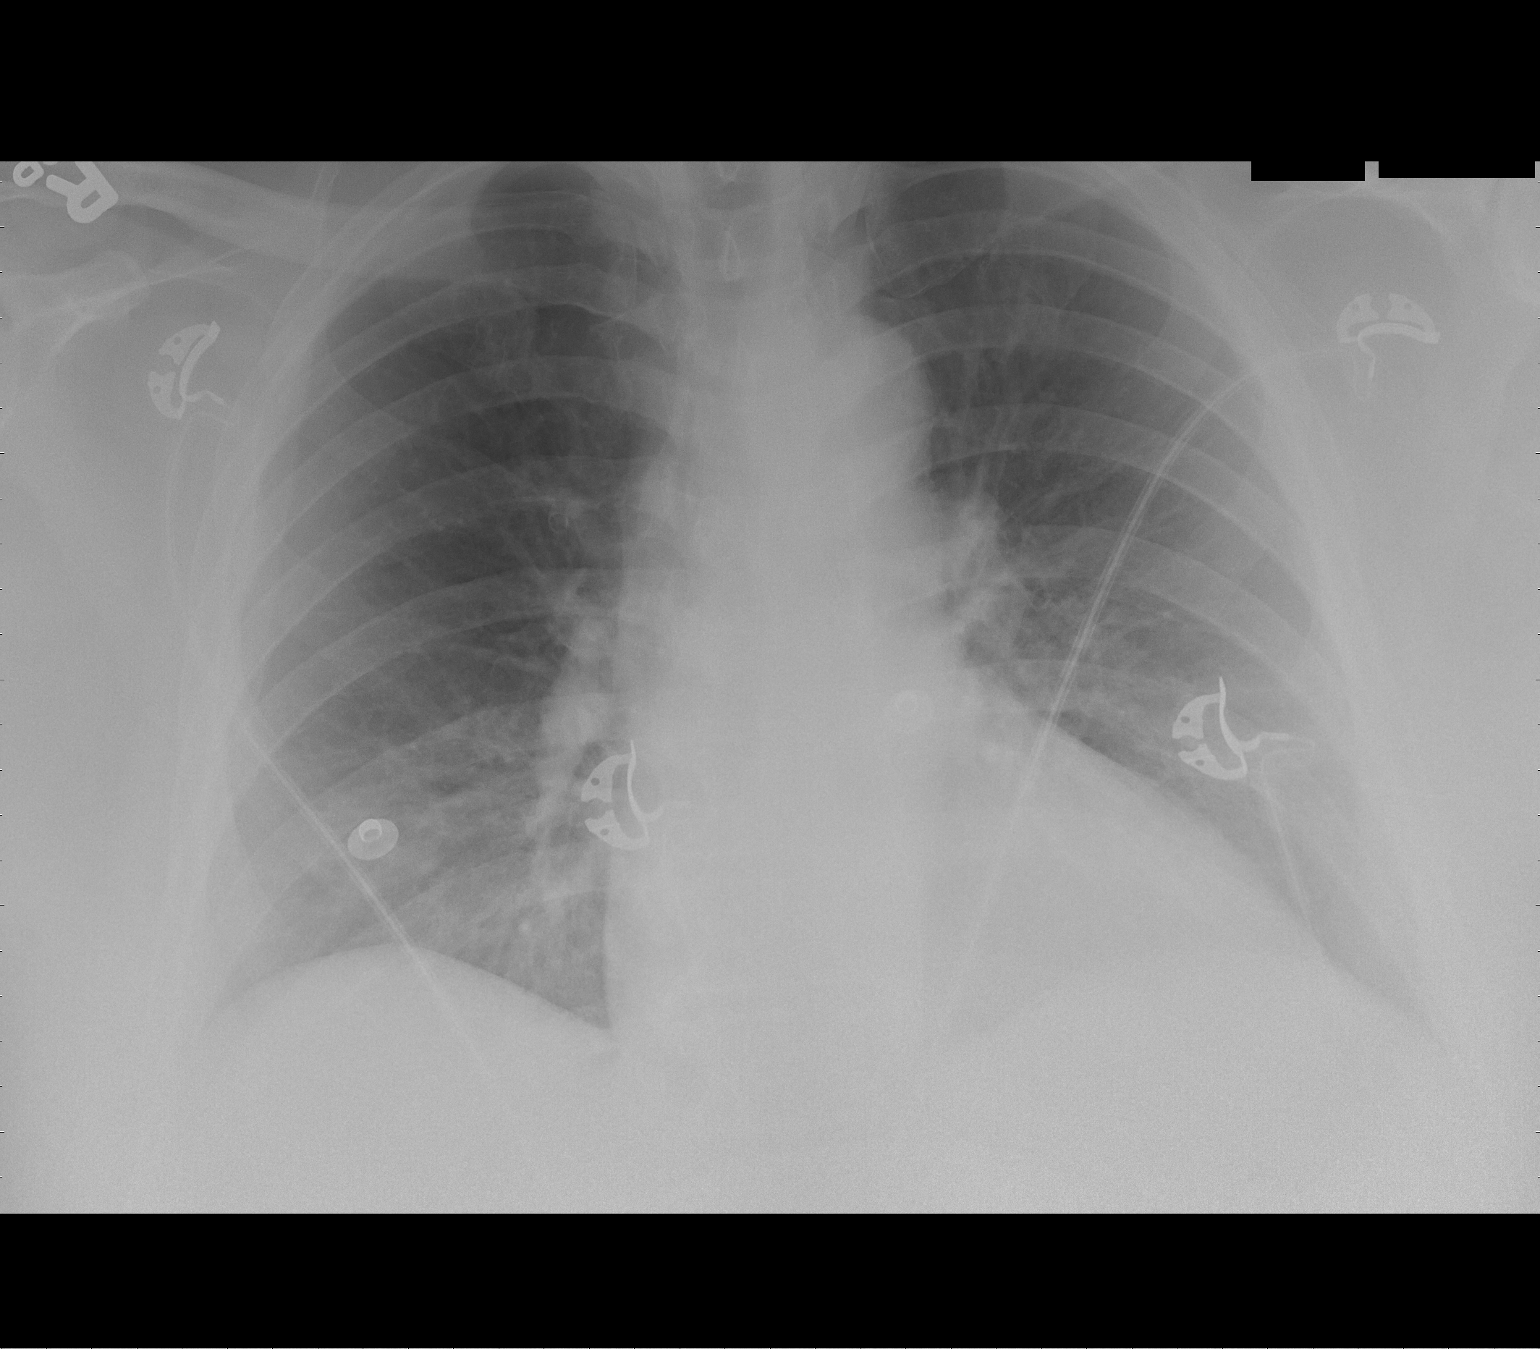

[1 of 1 positions shown; findings below may reference images not displayed]

FINDINGS: The cardiac silhouette, mediastinal and hilar contours
are within normal limits.  The lungs are clear.  Minimal streaky
bibasilar atelectasis.  The bony thorax is intact.
IMPRESSION: No acute cardiopulmonary findings.

## 2010-11-07 ENCOUNTER — Emergency Department (HOSPITAL_COMMUNITY): Payer: Medicare Other

## 2010-11-07 LAB — URINALYSIS, ROUTINE W REFLEX MICROSCOPIC
Bilirubin Urine: NEGATIVE
Glucose, UA: NEGATIVE mg/dL
Hgb urine dipstick: NEGATIVE
Ketones, ur: NEGATIVE mg/dL
Nitrite: NEGATIVE
Protein, ur: NEGATIVE mg/dL
Specific Gravity, Urine: 1.007 (ref 1.005–1.030)
Urobilinogen, UA: 1 mg/dL (ref 0.0–1.0)
pH: 7.5 (ref 5.0–8.0)

## 2010-11-10 ENCOUNTER — Emergency Department (HOSPITAL_COMMUNITY)
Admission: EM | Admit: 2010-11-10 | Discharge: 2010-11-11 | Disposition: A | Discharge status: Other Acute Inpatient Hospital | Payer: Medicare Other | Source: Home / Self Care | Attending: Emergency Medicine | Admitting: Emergency Medicine

## 2010-11-10 DIAGNOSIS — F209 Schizophrenia, unspecified: Secondary | ICD-10-CM | POA: Insufficient documentation

## 2010-11-10 DIAGNOSIS — E871 Hypo-osmolality and hyponatremia: Secondary | ICD-10-CM | POA: Insufficient documentation

## 2010-11-10 DIAGNOSIS — F319 Bipolar disorder, unspecified: Secondary | ICD-10-CM | POA: Insufficient documentation

## 2010-11-10 DIAGNOSIS — E119 Type 2 diabetes mellitus without complications: Secondary | ICD-10-CM | POA: Insufficient documentation

## 2010-11-10 DIAGNOSIS — I10 Essential (primary) hypertension: Secondary | ICD-10-CM | POA: Insufficient documentation

## 2010-11-10 DIAGNOSIS — I2510 Atherosclerotic heart disease of native coronary artery without angina pectoris: Secondary | ICD-10-CM | POA: Insufficient documentation

## 2010-11-10 DIAGNOSIS — I509 Heart failure, unspecified: Secondary | ICD-10-CM | POA: Insufficient documentation

## 2010-11-10 DIAGNOSIS — F22 Delusional disorders: Secondary | ICD-10-CM | POA: Insufficient documentation

## 2010-11-10 LAB — DIFFERENTIAL
Basophils Absolute: 0 10*3/uL (ref 0.0–0.1)
Lymphs Abs: 2.4 10*3/uL (ref 0.7–4.0)
Monocytes Relative: 12 % (ref 3–12)
Neutro Abs: 3.7 10*3/uL (ref 1.7–7.7)
Neutrophils Relative %: 52 % (ref 43–77)

## 2010-11-10 LAB — CBC
HCT: 37.5 % (ref 36.0–46.0)
MCHC: 33.6 g/dL (ref 30.0–36.0)
Platelets: 238 10*3/uL (ref 150–400)
RDW: 13.5 % (ref 11.5–15.5)
WBC: 7.1 10*3/uL (ref 4.0–10.5)

## 2010-11-10 LAB — URINALYSIS, ROUTINE W REFLEX MICROSCOPIC
Bilirubin Urine: NEGATIVE
Glucose, UA: NEGATIVE mg/dL
Nitrite: NEGATIVE
Specific Gravity, Urine: 1.009 (ref 1.005–1.030)
pH: 7 (ref 5.0–8.0)

## 2010-11-10 LAB — URINE MICROSCOPIC-ADD ON

## 2010-11-10 LAB — COMPREHENSIVE METABOLIC PANEL
ALT: 6 U/L (ref 0–35)
Albumin: 3.8 g/dL (ref 3.5–5.2)
Alkaline Phosphatase: 46 U/L (ref 39–117)
Calcium: 9.3 mg/dL (ref 8.4–10.5)
GFR calc Af Amer: >60 mL/min (ref >60)
Glucose, Bld: 114 mg/dL — ABNORMAL HIGH (ref 70–99)
Potassium: 4.3 mEq/L (ref 3.5–5.1)
Sodium: 126 mEq/L — ABNORMAL LOW (ref 135–145)
Total Protein: 7.1 g/dL (ref 6.0–8.3)

## 2010-11-10 LAB — ETHANOL: Alcohol, Ethyl (B): <11 mg/dL — ABNORMAL HIGH (ref 0–10)

## 2010-11-10 LAB — RAPID URINE DRUG SCREEN, HOSP PERFORMED: Barbiturates: NOT DETECTED

## 2010-11-10 LAB — VALPROIC ACID LEVEL: Valproic Acid Lvl: 49.1 ug/mL — ABNORMAL LOW (ref 50.0–100.0)

## 2010-11-11 ENCOUNTER — Inpatient Hospital Stay (HOSPITAL_COMMUNITY)
Admission: RE | Admit: 2010-11-11 | Discharge: 2010-11-16 | DRG: 885 | Disposition: A | Discharge status: Home / Self Care | Payer: Medicare Other | Source: Ambulatory Visit | Attending: Psychiatry | Admitting: Psychiatry

## 2010-11-11 DIAGNOSIS — E871 Hypo-osmolality and hyponatremia: Secondary | ICD-10-CM

## 2010-11-11 DIAGNOSIS — Z7982 Long term (current) use of aspirin: Secondary | ICD-10-CM

## 2010-11-11 DIAGNOSIS — F22 Delusional disorders: Secondary | ICD-10-CM

## 2010-11-11 DIAGNOSIS — F259 Schizoaffective disorder, unspecified: Secondary | ICD-10-CM

## 2010-11-11 DIAGNOSIS — E669 Obesity, unspecified: Secondary | ICD-10-CM

## 2010-11-11 DIAGNOSIS — Z880 Allergy status to penicillin: Secondary | ICD-10-CM

## 2010-11-11 DIAGNOSIS — I4892 Unspecified atrial flutter: Secondary | ICD-10-CM

## 2010-11-11 DIAGNOSIS — I10 Essential (primary) hypertension: Secondary | ICD-10-CM

## 2010-11-11 DIAGNOSIS — E039 Hypothyroidism, unspecified: Secondary | ICD-10-CM

## 2010-11-11 DIAGNOSIS — F319 Bipolar disorder, unspecified: Secondary | ICD-10-CM

## 2010-11-11 DIAGNOSIS — F1011 Alcohol abuse, in remission: Secondary | ICD-10-CM

## 2010-11-11 DIAGNOSIS — E119 Type 2 diabetes mellitus without complications: Secondary | ICD-10-CM

## 2010-11-11 DIAGNOSIS — I428 Other cardiomyopathies: Secondary | ICD-10-CM

## 2010-11-11 DIAGNOSIS — Z882 Allergy status to sulfonamides status: Secondary | ICD-10-CM

## 2010-11-11 DIAGNOSIS — K219 Gastro-esophageal reflux disease without esophagitis: Secondary | ICD-10-CM

## 2010-11-11 DIAGNOSIS — G4733 Obstructive sleep apnea (adult) (pediatric): Secondary | ICD-10-CM

## 2010-11-11 LAB — URINE CULTURE
Colony Count: 35000
Culture  Setup Time: 201205240202

## 2010-11-12 ENCOUNTER — Ambulatory Visit (HOSPITAL_BASED_OUTPATIENT_CLINIC_OR_DEPARTMENT_OTHER): Payer: Medicare Other

## 2010-11-13 LAB — BASIC METABOLIC PANEL
BUN: 8 mg/dL (ref 6–23)
CO2: 26 mEq/L (ref 19–32)
Calcium: 9.5 mg/dL (ref 8.4–10.5)
Creatinine, Ser: 0.84 mg/dL (ref 0.4–1.2)
Glucose, Bld: 111 mg/dL — ABNORMAL HIGH (ref 70–99)

## 2010-11-16 LAB — CBC
Hemoglobin: 10.9 g/dL — ABNORMAL LOW (ref 12.0–15.0)
MCH: 32.8 pg (ref 26.0–34.0)
MCHC: 33.5 g/dL (ref 30.0–36.0)
Platelets: 236 10*3/uL (ref 150–400)
RDW: 14 % (ref 11.5–15.5)

## 2010-11-16 LAB — DIFFERENTIAL
Basophils Relative: 0 % (ref 0–1)
Eosinophils Absolute: 0 10*3/uL (ref 0.0–0.7)
Eosinophils Relative: 0 % (ref 0–5)
Monocytes Absolute: 0.6 10*3/uL (ref 0.1–1.0)
Monocytes Relative: 8 % (ref 3–12)

## 2010-11-16 LAB — BASIC METABOLIC PANEL
GFR calc non Af Amer: >60 mL/min (ref >60)
Glucose, Bld: 93 mg/dL (ref 70–99)
Potassium: 4 mEq/L (ref 3.5–5.1)
Sodium: 134 mEq/L — ABNORMAL LOW (ref 135–145)

## 2010-11-17 NOTE — H&P (Signed)
NAME:  [name], [name]               ACCOUNT NO.:  [id number]  MEDICAL RECORD NO.:  [id number]           PATIENT TYPE:  I  LOCATION:  0405                          FACILITY:  [facility]  PHYSICIAN:  Eulogio Ditch, MD DATE OF BIRTH:  1951-04-29  DATE OF ADMISSION:  11/11/2010 DATE OF DISCHARGE:                      PSYCHIATRIC ADMISSION ASSESSMENT   TIME:  11:45 a.m.  IDENTIFYING INFORMATION:  A 60 year old female.  This is a voluntary admission.  HISTORY OF PRESENT ILLNESS:  This is one of several [facility] admissions for [facility] who has a history of schizoaffective disorder.  On this occasion, she presented in the emergency room accompanied by her husband who reported that her sleep had been markedly decreased lately.  She had been more agitated and much more preoccupied with some delusions which she has also expressed in the past.  These continue to be believing that she is pregnant with in fact she is not, that others are plotting to get her money and kill her, and expressing some grandiosity in terms of her roles in life being a judge, a Emergency planning/management officer, a physician and some religious preoccupation.  She has no homicidal thoughts or suicidal thoughts.  She has minimal insight into this, but admits that her sleep has been poor.  She is asking for help.  PAST PSYCHIATRIC HISTORY:  Currently followed as an outpatient at [facility] which is now [facility].  She has a history of schizoaffective disorder and was most recently at [facility] in December 2012.  Additional recent admissions included an admission to our medical unit for a syncopal episode which was attributed to possible medication side effects.  At that time,  Seroquel 600 mg daily at bedtime was discontinued.  She continued on Depakote 1000 mg p.o. daily and at that time was started on [name] 6 mg p.o. daily at bedtime.  She was most recently admitted to our medical unit on October 14, 2010 for paroxysmal atrial flutter.   Her cardiologist, Dr. Sharyn Lull, started her on Pradaxa and amiodarone.  She has significant medical problems.  SOCIAL HISTORY:  This is a widowed Native American female who is living independently in her own apartment with support and help of her boyfriend.  __________ She had a distant history of alcohol abuse, but has been abstinent for 10 years.  She was previously married for many years.  Her husband was deceased in 2004-11-23.  She is on disability for her schizoaffective disorder since 1984/11/23 and her boyfriend is supportive and helpful.  She is also fairly well connected to a church community for support.  CURRENT CARE PROVIDERS: 1. Mohan N. Sharyn Lull, M.D., her cardiologist. 2. Marcene Duos, M.D. at [facility] is her primary care     physician.  CURRENT MEDICAL PROBLEMS: 1. Hypertension. 2. Atrial flutter. 3. Mild hyponatremia with sodium at 126. 4. Nonischemic cardiomyopathy. 5. Hypothyroidism. 6. Schizoaffective disorder. 7. GERD.  PAST MEDICAL HISTORY: 1. Significant for syncope, probably secondary to psychiatric meds     with admission in April 2012. 2. Obstructive sleep apnea previously on CPAP, currently not using.  CURRENT MEDICATIONS: 1. Synthroid 112 mcg daily. 2. Advair Discus  100/50 mcg one puff b.i.d. 3. Amlodipine 5 mg daily. 4. ASA 325 mg daily. 5. Amlodipine 5 mg daily. 6. Folic acid 1 mg q.a.m. 7. Lisinopril 40 mg q.12 h. 8. Nitroglycerin 0.4 mg sublingual q.5 minutes p.r.n. chest pain x3     doses. 9. Amiodarone 200 mg one twice daily. 10.Nabumetone 500 mg q.12 h. p.r.n. 11.Trazodone 100 mg two tablets daily at bedtime. 12.Fluticasone 50 mcg two sprays each nostril. 13.[name] 6 mg p.o. nightly. 14.Depakote ER 500 mg two tablets daily at bedtime. 15.Pradaxa 150 mg one tablet p.o. b.i.d. 16.Albuterol inhaler two puffs q.4 h. p.r.n.  DRUG ALLERGIES: 1. PENICILLIN. 2. SULFA.  PHYSICAL EXAMINATION:  GENERAL:  Full physical exam was done in  the emergency room and is noted in the record.  This is an obese Native American female, normally developed, no abnormal movements.  VITAL SIGNS:  Weighs 150 kg, 5 feet 2 inches tall.  LABORATORY DATA:  Alcohol screen negative.  Urine drug screen negative. Sodium was 126, but she has previous baseline measurements frequently of around 127, valproate level 49.1.  Urinalysis shows WBCs 3-6 per high- powered field, many leukocytes and many epithelials.  Urine culture is pending.  MENTAL STATUS EXAM:  Fully alert female, pleasant, cooperative.  She appears to have gained weight and we will compare weights to her previous admission.  She is complaining of being drowsy and had received her [name] this morning instead of as usual at bedtime, otherwise alert, cooperative.  Took some questioning for me to elicit her delusions.  She is not agitated in any way and did not initially present with any delusional thinking.  Only upon questioning, does she admit that she has been through a lot and that people are after her for her money.  She expressed no other of her typical delusional thoughts of being pregnant by deities.  No suicidal thoughts.  No agitation.  No homicidal thinking.  Speech is nonpressured.  Generally logical.  Is able to review her daily routine for me and give a fairly logical history. Alert, oriented to person, place and situation.  Insight minimal. Judgment fair.  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 AXIS II:  No diagnosis. AXIS III:  Hypothyroidism.  Atrial flutter.  Gastroesophageal reflux disease.  Mild hyponatremia.  Hypertension. AXIS IV:  Deferred. AXIS V:  Current 40, past year not known.  PLAN:  The plan is to voluntarily admit her.  She is on our acute stabilization unit.  We will hear from her boyfriend.  She reports that she does not use a pill planner.  We are not quite sure how compliant she is  with her medications or well organized.  We will put her on a controlled carbohydrate diet for her reports of diet-controlled diabetes and we will check CBG b.i.d.  We will contact her cardiologist, Dr. Sharyn Lull to just validate her doses of amiodarone and Pradaxa.  We will continue her current medication regimen.  We will recheck the valproate level in a couple days to see if it has changed.     Margaret A. Lorin Picket, N.P.   ______________________________ Eulogio Ditch, MD    MAS/MEDQ  D:  11/11/2010  T:  11/12/2010  Job:  [id number]  Electronically Signed by Kari Baars N.P. on 11/16/2010 01:06:43 PM Electronically Signed by Eulogio Ditch  on 11/17/2010 05:30:46 PM

## 2010-11-19 NOTE — Discharge Summary (Signed)
NAMERUBYLEE, ZAMARRIPA               ACCOUNT NO.:  [id number]  MEDICAL RECORD NO.:  [id number]           PATIENT TYPE:  I  LOCATION:  0504                          FACILITY:  [facility]  PHYSICIAN:  Eulogio Ditch, MD DATE OF BIRTH:  [birth year]  DATE OF ADMISSION:  11/11/2010 DATE OF DISCHARGE:  11/16/2010                              DISCHARGE SUMMARY   IDENTIFYING INFORMATION:  This is a 60 year old, widowed, Native American female.  This is a voluntary admission.  HISTORY OF PRESENT ILLNESS:  This is one of several [facility] admissions for [facility] who has a history of schizoaffective disorder.  On this occasion, she presented in our emergency room accompanied by her husband who reported that her sleep had been markedly decreased lately, that she had been much more agitated for 4-5 days prior to admission, and very preoccupied with some delusions which she has expressed in the past. These delusions include believing that she is pregnant when, in fact, she is not and that others are plotting to get her money and kill her. On presentation to our unit, she expressed some grandiosity in terms of her roles in life, being a judge, Emergency planning/management officer, a physician, and various other occupations.  She displayed minimal insight, but no active suicidal thoughts.  She is currently followed as an outpatient at [facility], which is now [place].  She takes a total of 16 various medications and has multiple medical problems.  We have referred her for home health nursing in the past to monitor her methods and organization for taking her medications.  MEDICAL EVALUATION AND DIAGNOSTIC STUDIES:  Her primary care physicians are Dr. Sharyn Lull, her cardiologist, and Julieanne Manson, MD at [facility].  Chronic medical problems include hypertension, atrial flutter, nonischemic cardiomyopathy, hypothyroidism, schizoaffective disorder, and GERD.  She was found to be mildly hyponatremic in  the emergency room with a sodium of 126.  Her urine drug screen was noted to be negative.  Valproate level 49.1 and routine urinalysis with WBCs 3-6 per high-powered field.  Urine culture noted 35,000 colonies of multiple bacterial morphotypes.  This is an obese Native [place] female with no abnormal movements and a negative neuro exam.  COURSE OF HOSPITALIZATION:  She was admitted to our acute stabilization and intensive care unit and was immediately placed back on her routine medications, which included Depakote 1,000 mg ER nightly and [name] 6 mg p.o. nightly.  Her other routine medications were continued and by the next morning, her psychotic symptoms had resolved significantly.  She was calm.  We could elicit her fixed delusions on close questioning, but her appetite was good, she was cooperative with the staff, and no agitation.  She was requesting to leave.  She remained on our unit until Nov 16, 2010 to ensure that she was indeed stable and hear back from her family.  She lives with her boyfriend of several years, so she refers to him as her husband.  He visited her almost daily while here and was anxious to have her back home.  He had no concerns and gave feedback that indeed she did take her medications  regularly.  By the Nov 16, 2010, she was completely stable.  No further agitation.  She was felt to be at her baseline.  She was not displaying any grandiosity.  She was thinking generally logical.  Her fixed delusion of believing that she was pregnant does persist when questioned closely, but able to function well.  She planned on keeping her follow-up appointments and had no suicidal thoughts.  We contacted her cardiologist who instructed [place] to change her home amiodarone dose from 200 mg twice daily to 100 mg daily.  DISCHARGE PLAN:  Followup at [facility] for routine medical problems on Nov 18, 2010 as previously scheduled and followup at [facility] on the  first day after discharge between 9:00 and 11:00 a.m. for a routine intake appointment.  DISCHARGE MEDICATIONS: 1. Amiodarone 200 mg daily. 2. Advair Diskus 100/50 one puff b.i.d. 3. Albuterol inhaler 2 puffs q.4 hours as needed for asthma. 4. Amlodipine 5 mg daily. 5. Depakote ER 1,000 mg nightly. 6. Fluticasone nasal spray 50 mcg two sprays q.a.m. 7. Folic acid 1 mg q.a.m. 8. [name] 6 mg nightly. 9. Lisinopril 40 mg q.a.m. and nightly. 10.Metoprolol 50 mg b.i.d. 11.Nabumetone 500 mg q.12 hours as needed for arthritis pain. 12.Nitroglycerin 0.4 mg one tablet sublingual q.4 hours up to three     doses as needed for chest pain. 13.Omeprazole 20 mg daily. 14.Pradaxa 150 mg twice daily. 15.Synthroid 112 mcg q.a.m. 16.Trazodone 200 mg nightly.     Margaret A. Lorin Picket, N.P.   ______________________________ Eulogio Ditch, MD    MAS/MEDQ  D:  11/17/2010  T:  11/17/2010  Job:  [id number]  Electronically Signed by Kari Baars N.P. on 11/19/2010 08:34:57 AM Electronically Signed by Eulogio Ditch  on 11/19/2010 06:09:59 PM

## 2010-11-25 ENCOUNTER — Emergency Department (HOSPITAL_COMMUNITY): Payer: Medicare Other

## 2010-11-25 ENCOUNTER — Emergency Department (HOSPITAL_COMMUNITY)
Admission: EM | Admit: 2010-11-25 | Discharge: 2010-11-25 | Disposition: A | Discharge status: Home / Self Care | Payer: Medicare Other | Attending: Emergency Medicine | Admitting: Emergency Medicine

## 2010-11-25 DIAGNOSIS — F319 Bipolar disorder, unspecified: Secondary | ICD-10-CM | POA: Insufficient documentation

## 2010-11-25 DIAGNOSIS — I10 Essential (primary) hypertension: Secondary | ICD-10-CM | POA: Insufficient documentation

## 2010-11-25 DIAGNOSIS — J45909 Unspecified asthma, uncomplicated: Secondary | ICD-10-CM | POA: Insufficient documentation

## 2010-11-25 DIAGNOSIS — Z8659 Personal history of other mental and behavioral disorders: Secondary | ICD-10-CM | POA: Insufficient documentation

## 2010-11-25 DIAGNOSIS — Z79899 Other long term (current) drug therapy: Secondary | ICD-10-CM | POA: Insufficient documentation

## 2010-11-25 DIAGNOSIS — I509 Heart failure, unspecified: Secondary | ICD-10-CM | POA: Insufficient documentation

## 2010-11-25 DIAGNOSIS — R071 Chest pain on breathing: Secondary | ICD-10-CM | POA: Insufficient documentation

## 2010-11-25 DIAGNOSIS — E669 Obesity, unspecified: Secondary | ICD-10-CM | POA: Insufficient documentation

## 2010-11-25 DIAGNOSIS — N898 Other specified noninflammatory disorders of vagina: Secondary | ICD-10-CM | POA: Insufficient documentation

## 2010-11-25 DIAGNOSIS — I2510 Atherosclerotic heart disease of native coronary artery without angina pectoris: Secondary | ICD-10-CM | POA: Insufficient documentation

## 2010-11-25 DIAGNOSIS — E119 Type 2 diabetes mellitus without complications: Secondary | ICD-10-CM | POA: Insufficient documentation

## 2010-11-25 DIAGNOSIS — R059 Cough, unspecified: Secondary | ICD-10-CM | POA: Insufficient documentation

## 2010-11-25 DIAGNOSIS — R05 Cough: Secondary | ICD-10-CM | POA: Insufficient documentation

## 2010-11-25 LAB — WET PREP, GENITAL: Trich, Wet Prep: NONE SEEN

## 2010-11-25 LAB — CBC
HCT: 33.1 % — ABNORMAL LOW (ref 36.0–46.0)
Hemoglobin: 11.1 g/dL — ABNORMAL LOW (ref 12.0–15.0)
WBC: 5.8 10*3/uL (ref 4.0–10.5)

## 2010-11-25 LAB — DIFFERENTIAL
Basophils Absolute: 0 10*3/uL (ref 0.0–0.1)
Lymphocytes Relative: 40 % (ref 12–46)
Monocytes Absolute: 0.7 10*3/uL (ref 0.1–1.0)
Neutro Abs: 2.7 10*3/uL (ref 1.7–7.7)

## 2010-11-25 LAB — URINALYSIS, ROUTINE W REFLEX MICROSCOPIC
Bilirubin Urine: NEGATIVE
Hgb urine dipstick: NEGATIVE
Protein, ur: NEGATIVE mg/dL
Urobilinogen, UA: 0.2 mg/dL (ref 0.0–1.0)

## 2010-11-25 LAB — BASIC METABOLIC PANEL
CO2: 33 mEq/L — ABNORMAL HIGH (ref 19–32)
Calcium: 9 mg/dL (ref 8.4–10.5)
Creatinine, Ser: 0.82 mg/dL (ref 0.4–1.2)
Glucose, Bld: 98 mg/dL (ref 70–99)

## 2010-11-25 LAB — OCCULT BLOOD, POC DEVICE: Fecal Occult Bld: NEGATIVE

## 2010-11-25 LAB — CK TOTAL AND CKMB (NOT AT ARMC)
Relative Index: INVALID (ref 0.0–2.5)
Total CK: 26 U/L (ref 7–177)

## 2010-11-26 ENCOUNTER — Emergency Department (HOSPITAL_COMMUNITY)
Admission: EM | Admit: 2010-11-26 | Discharge: 2010-11-26 | Disposition: A | Discharge status: Home / Self Care | Payer: Medicare Other | Attending: Emergency Medicine | Admitting: Emergency Medicine

## 2010-11-26 ENCOUNTER — Emergency Department (HOSPITAL_COMMUNITY)
Admission: EM | Admit: 2010-11-26 | Discharge: 2010-11-26 | Discharge status: Against Medical Advice | Payer: Medicare Other | Attending: Emergency Medicine | Admitting: Emergency Medicine

## 2010-11-26 DIAGNOSIS — I2510 Atherosclerotic heart disease of native coronary artery without angina pectoris: Secondary | ICD-10-CM | POA: Insufficient documentation

## 2010-11-26 DIAGNOSIS — R109 Unspecified abdominal pain: Secondary | ICD-10-CM | POA: Insufficient documentation

## 2010-11-26 DIAGNOSIS — Z049 Encounter for examination and observation for unspecified reason: Secondary | ICD-10-CM | POA: Insufficient documentation

## 2010-11-26 DIAGNOSIS — E119 Type 2 diabetes mellitus without complications: Secondary | ICD-10-CM | POA: Insufficient documentation

## 2010-11-26 DIAGNOSIS — I10 Essential (primary) hypertension: Secondary | ICD-10-CM | POA: Insufficient documentation

## 2010-11-26 LAB — GC/CHLAMYDIA PROBE AMP, GENITAL: Chlamydia, DNA Probe: NEGATIVE

## 2010-11-29 ENCOUNTER — Emergency Department (HOSPITAL_COMMUNITY)
Admission: EM | Admit: 2010-11-29 | Discharge: 2010-11-30 | Disposition: A | Discharge status: Home / Self Care | Payer: Medicare Other | Attending: Emergency Medicine | Admitting: Emergency Medicine

## 2010-11-29 DIAGNOSIS — F319 Bipolar disorder, unspecified: Secondary | ICD-10-CM | POA: Insufficient documentation

## 2010-11-29 DIAGNOSIS — R1013 Epigastric pain: Secondary | ICD-10-CM | POA: Insufficient documentation

## 2010-11-29 DIAGNOSIS — I2510 Atherosclerotic heart disease of native coronary artery without angina pectoris: Secondary | ICD-10-CM | POA: Insufficient documentation

## 2010-11-29 DIAGNOSIS — I509 Heart failure, unspecified: Secondary | ICD-10-CM | POA: Insufficient documentation

## 2010-11-29 DIAGNOSIS — I4891 Unspecified atrial fibrillation: Secondary | ICD-10-CM | POA: Insufficient documentation

## 2010-11-29 DIAGNOSIS — R10819 Abdominal tenderness, unspecified site: Secondary | ICD-10-CM | POA: Insufficient documentation

## 2010-11-29 DIAGNOSIS — E119 Type 2 diabetes mellitus without complications: Secondary | ICD-10-CM | POA: Insufficient documentation

## 2010-11-29 DIAGNOSIS — R0989 Other specified symptoms and signs involving the circulatory and respiratory systems: Secondary | ICD-10-CM | POA: Insufficient documentation

## 2010-11-29 DIAGNOSIS — R0609 Other forms of dyspnea: Secondary | ICD-10-CM | POA: Insufficient documentation

## 2010-11-29 DIAGNOSIS — J45909 Unspecified asthma, uncomplicated: Secondary | ICD-10-CM | POA: Insufficient documentation

## 2010-11-29 DIAGNOSIS — R1110 Vomiting, unspecified: Secondary | ICD-10-CM | POA: Insufficient documentation

## 2010-11-29 DIAGNOSIS — I10 Essential (primary) hypertension: Secondary | ICD-10-CM | POA: Insufficient documentation

## 2010-11-30 ENCOUNTER — Emergency Department (HOSPITAL_COMMUNITY): Payer: Medicare Other

## 2010-11-30 LAB — COMPREHENSIVE METABOLIC PANEL
ALT: 7 U/L (ref 0–35)
AST: 7 U/L (ref 0–37)
Albumin: 2.8 g/dL — ABNORMAL LOW (ref 3.5–5.2)
Alkaline Phosphatase: 40 U/L (ref 39–117)
BUN: 11 mg/dL (ref 6–23)
Chloride: 92 mEq/L — ABNORMAL LOW (ref 96–112)
Potassium: 3.9 mEq/L (ref 3.5–5.1)
Sodium: 129 mEq/L — ABNORMAL LOW (ref 135–145)
Total Bilirubin: 0.1 mg/dL — ABNORMAL LOW (ref 0.3–1.2)
Total Protein: 5.9 g/dL — ABNORMAL LOW (ref 6.0–8.3)

## 2010-11-30 LAB — TROPONIN I
Troponin I: <0.3 ng/mL (ref <0.30)
Troponin I: <0.3 ng/mL (ref <0.30)

## 2010-11-30 LAB — CBC
HCT: 30.8 % — ABNORMAL LOW (ref 36.0–46.0)
Hemoglobin: 10.4 g/dL — ABNORMAL LOW (ref 12.0–15.0)
MCV: 97.8 fL (ref 78.0–100.0)
RDW: 13.8 % (ref 11.5–15.5)
WBC: 5.6 10*3/uL (ref 4.0–10.5)

## 2010-11-30 LAB — DIFFERENTIAL
Eosinophils Relative: 1 % (ref 0–5)
Lymphocytes Relative: 39 % (ref 12–46)
Lymphs Abs: 2.2 10*3/uL (ref 0.7–4.0)
Monocytes Absolute: 0.6 10*3/uL (ref 0.1–1.0)
Neutro Abs: 2.8 10*3/uL (ref 1.7–7.7)

## 2010-11-30 LAB — CK TOTAL AND CKMB (NOT AT ARMC)
Relative Index: INVALID (ref 0.0–2.5)
Relative Index: INVALID (ref 0.0–2.5)

## 2010-12-03 ENCOUNTER — Inpatient Hospital Stay (HOSPITAL_COMMUNITY)
Admission: AD | Admit: 2010-12-03 | Discharge: 2010-12-03 | Discharge status: Against Medical Advice | Payer: Medicare Other | Source: Ambulatory Visit | Attending: Obstetrics & Gynecology | Admitting: Obstetrics & Gynecology

## 2010-12-03 DIAGNOSIS — N938 Other specified abnormal uterine and vaginal bleeding: Secondary | ICD-10-CM | POA: Insufficient documentation

## 2010-12-03 DIAGNOSIS — N949 Unspecified condition associated with female genital organs and menstrual cycle: Secondary | ICD-10-CM | POA: Insufficient documentation

## 2010-12-03 LAB — URINALYSIS, ROUTINE W REFLEX MICROSCOPIC
Leukocytes, UA: NEGATIVE
Nitrite: NEGATIVE
Specific Gravity, Urine: 1.01 (ref 1.005–1.030)
Urobilinogen, UA: 0.2 mg/dL (ref 0.0–1.0)
pH: 7 (ref 5.0–8.0)

## 2010-12-03 LAB — CBC
Hemoglobin: 10.8 g/dL — ABNORMAL LOW (ref 12.0–15.0)
MCH: 33.2 pg (ref 26.0–34.0)
Platelets: 201 10*3/uL (ref 150–400)
RBC: 3.25 MIL/uL — ABNORMAL LOW (ref 3.87–5.11)

## 2010-12-05 IMAGING — CR DG CHEST 1V PORT
2 series · 2 of 2 positions shown · non-contrast
Comparison: 07/09/2009.

CLINICAL DATA: Cancer patient.  Vomiting.  Chest pain.

PORTABLE CHEST - 1 VIEW

[view not recorded (1 of 2)]
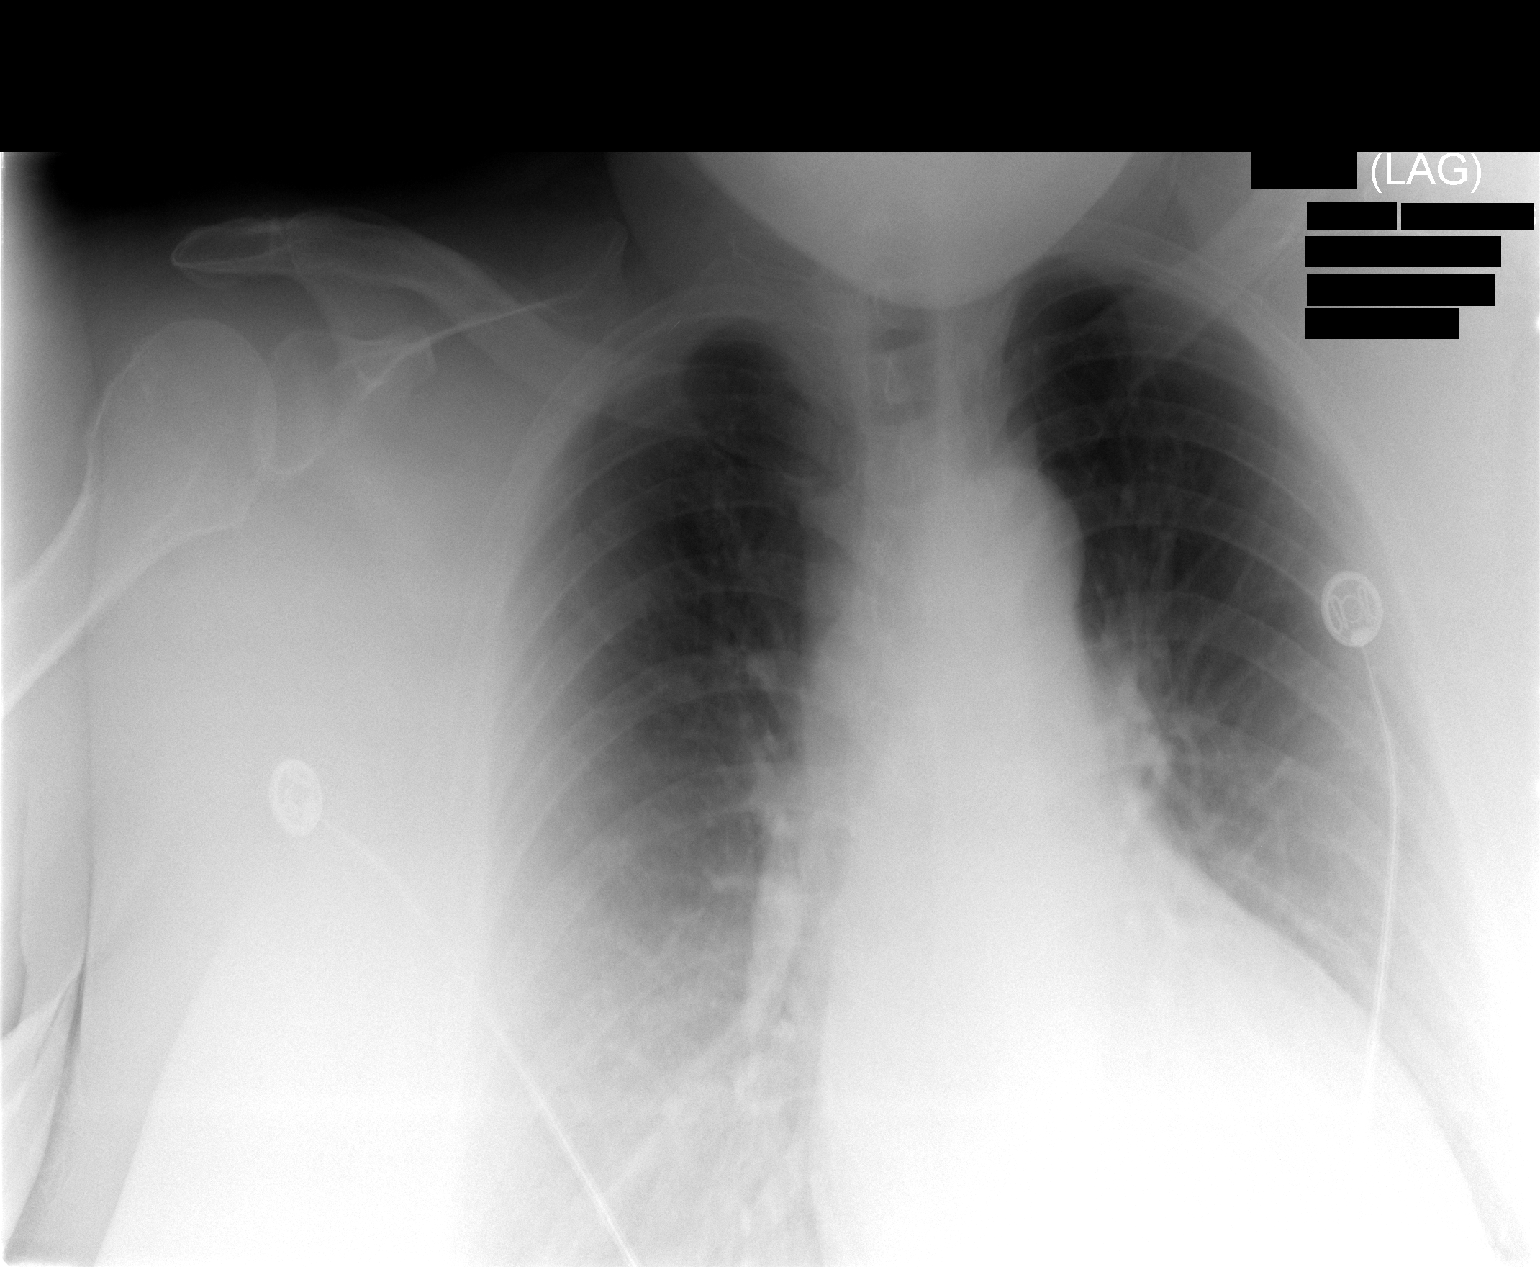

[view not recorded (2 of 2)]
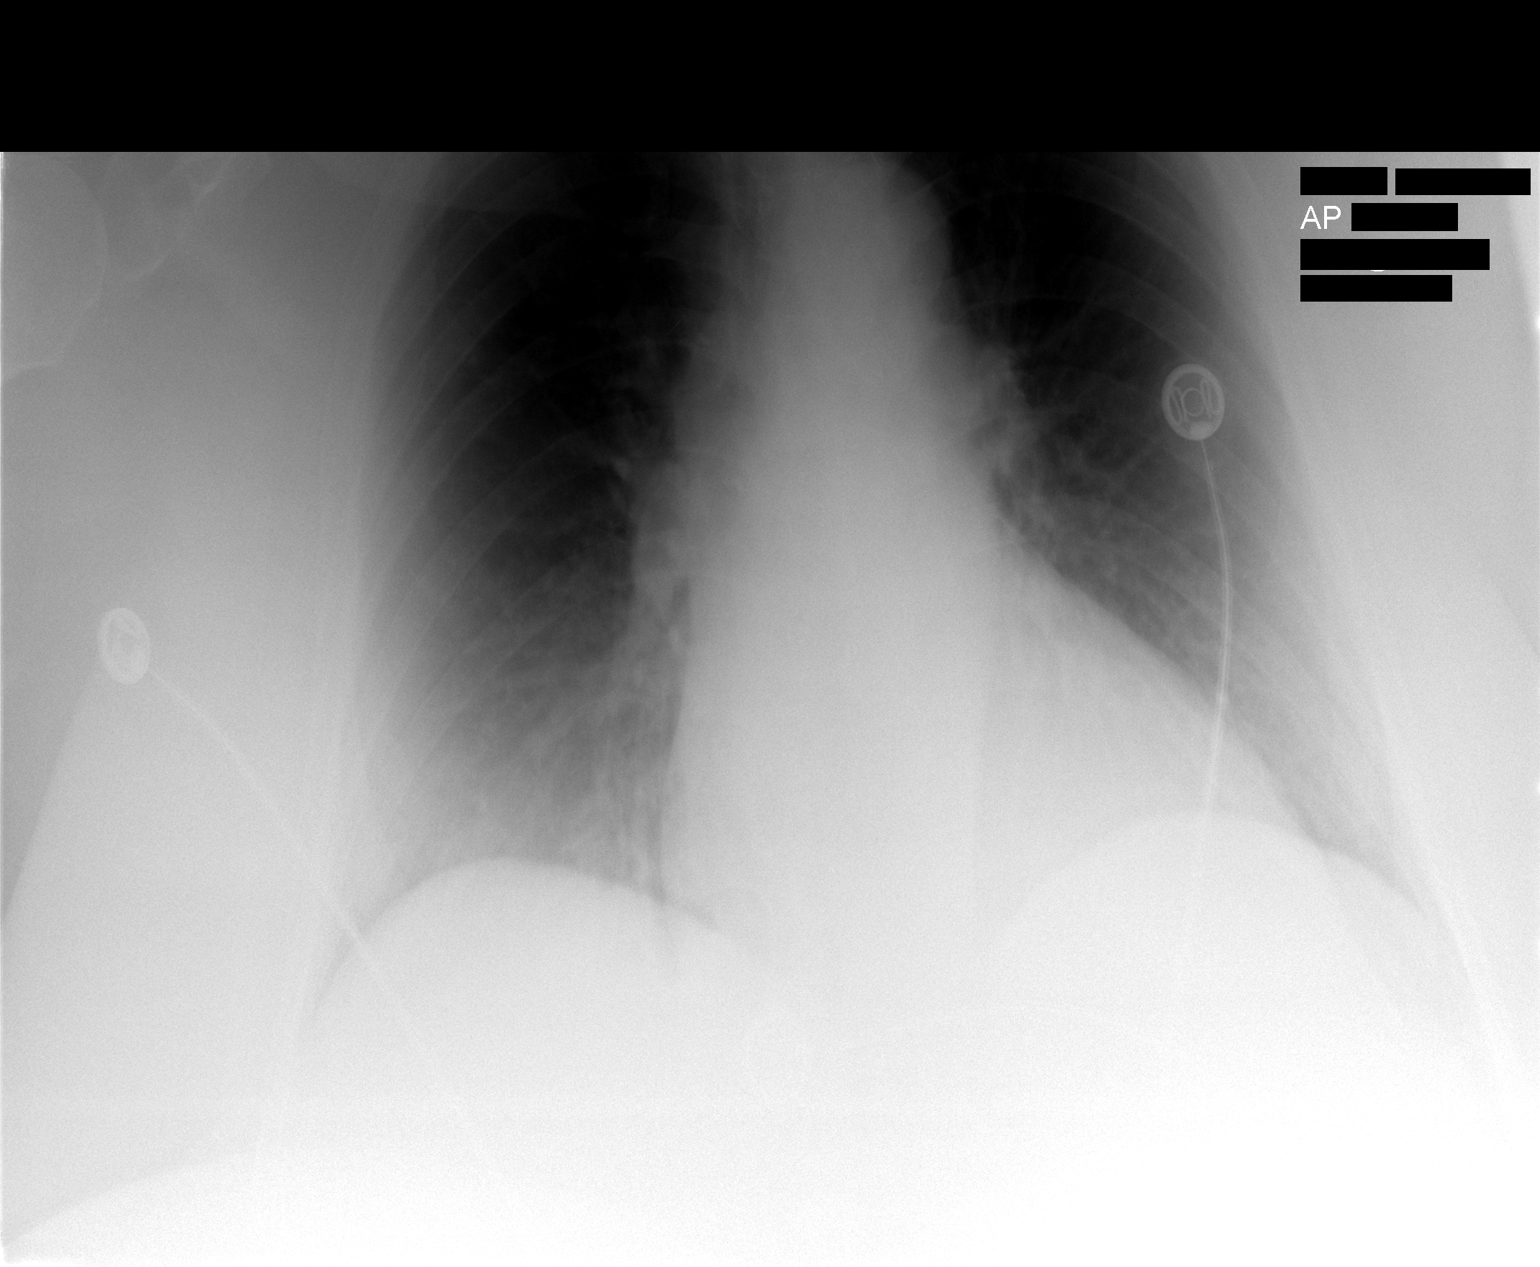

[2 of 2 positions shown; findings below may reference images not displayed]

FINDINGS: Lung volumes slightly low.  Mild bibasilar atelectasis.
No airspace disease.  No effusion.  Mediastinal contours appear
normal.  Exam under penetrated secondary to portable technique.
IMPRESSION: No definite acute cardiopulmonary disease.

## 2010-12-06 LAB — POCT PREGNANCY, URINE: Preg Test, Ur: NEGATIVE

## 2010-12-26 IMAGING — CR DG RIBS W/ CHEST 3+V*R*
3 series · 3 of 3 positions shown · non-contrast
Comparison: 08/07/2009

CLINICAL DATA: Fell.  Right rib pain. Hypertension.  Obesity.
Nonsmoker.

RIGHT RIBS AND CHEST - 3+ VIEW

[w chest pa]
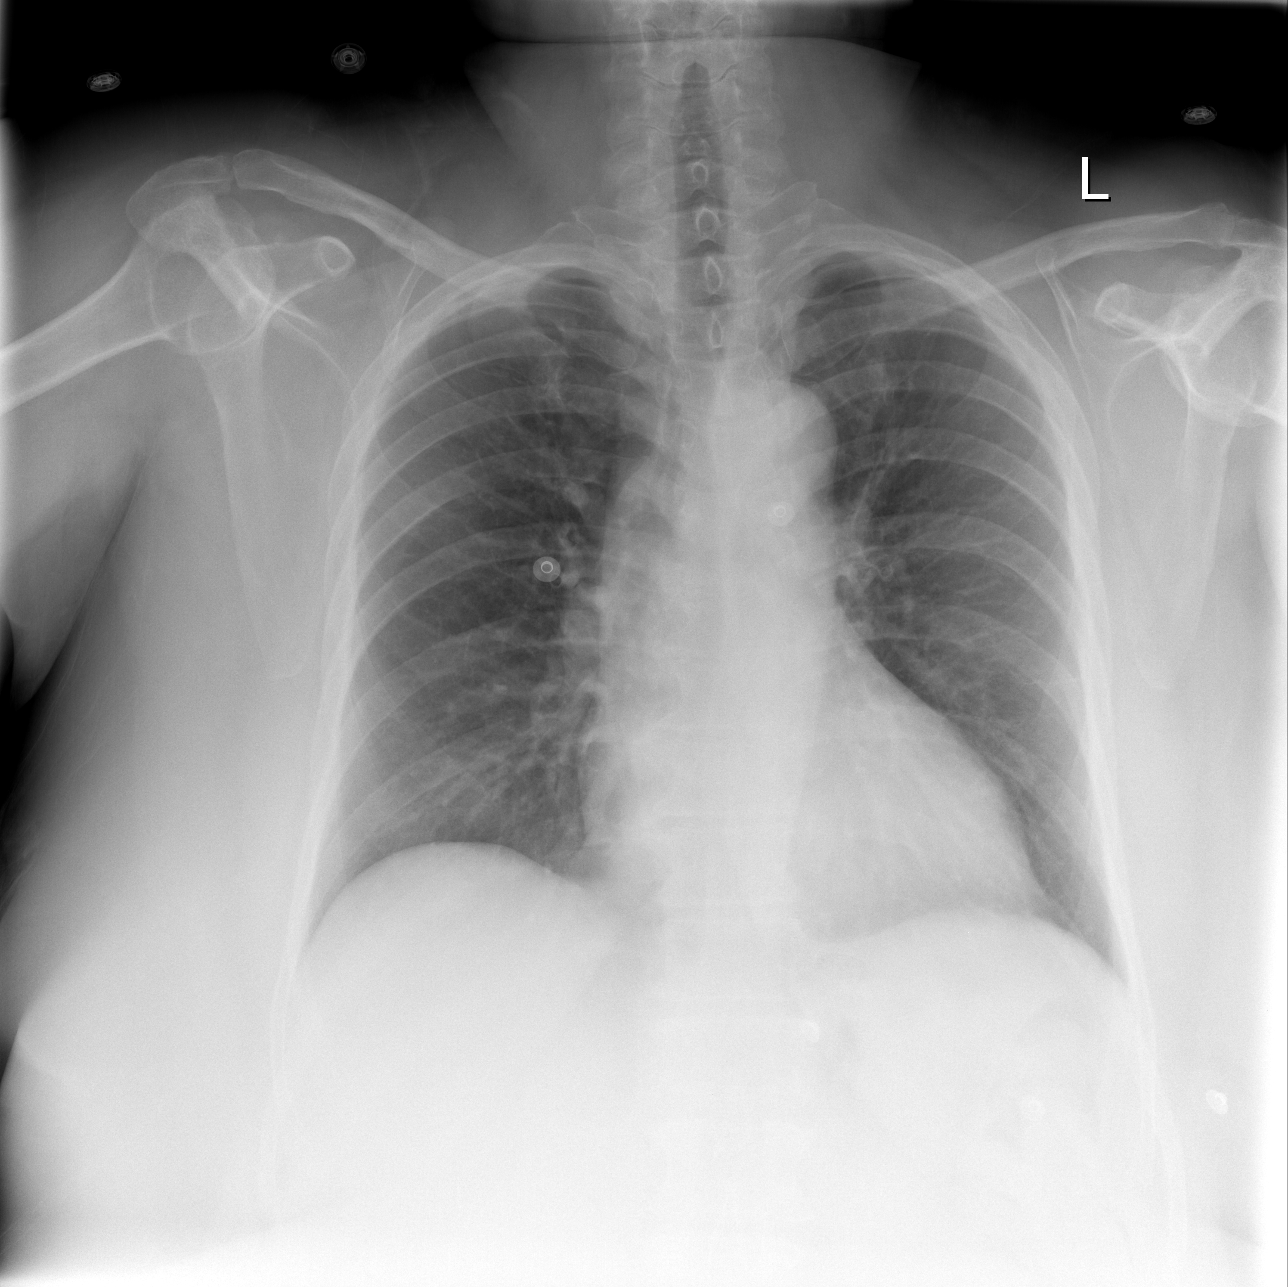

[w ribs ap/pa upper right]
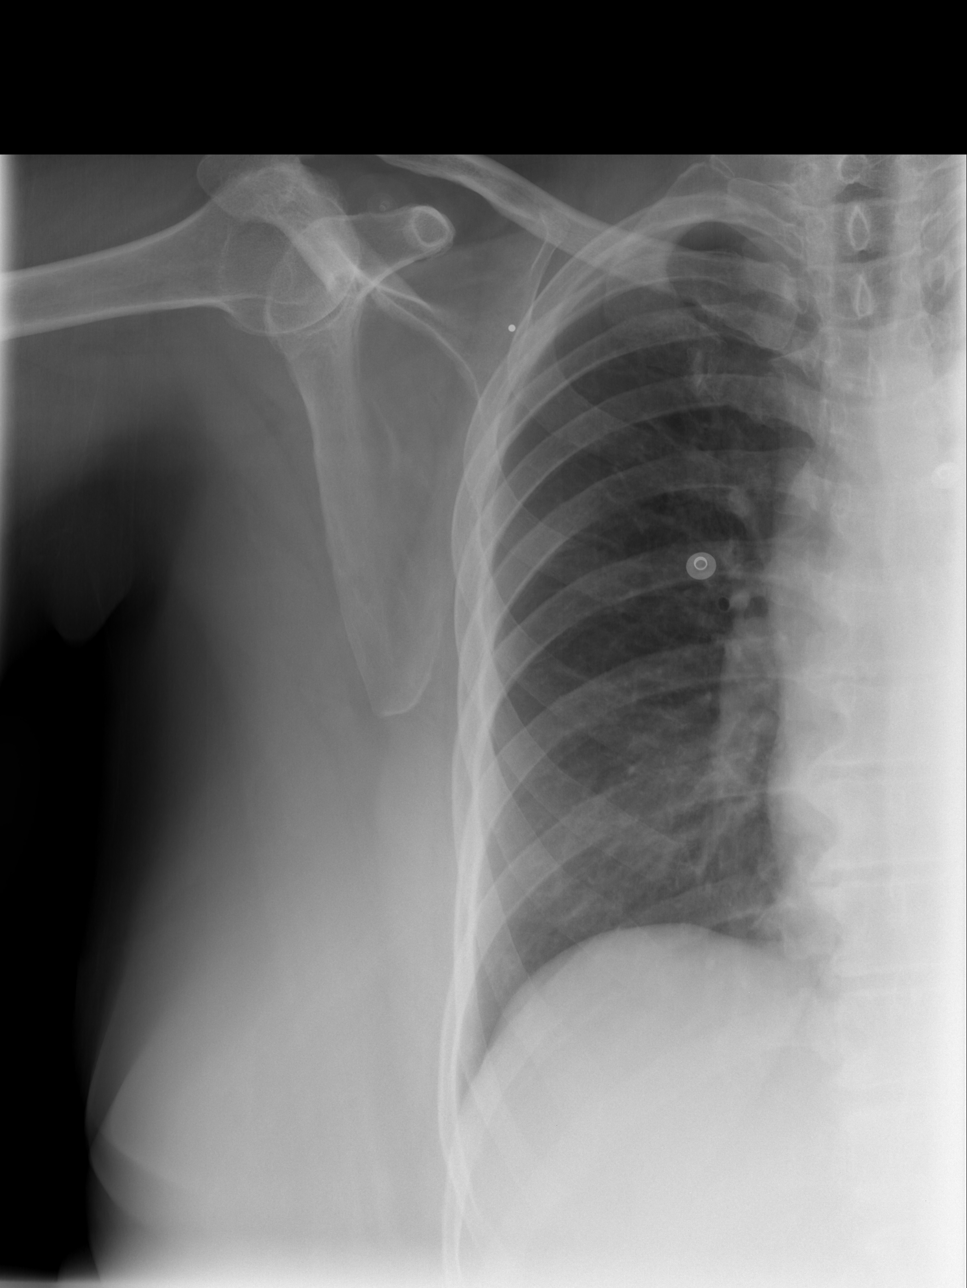

[w ribs oblique right]
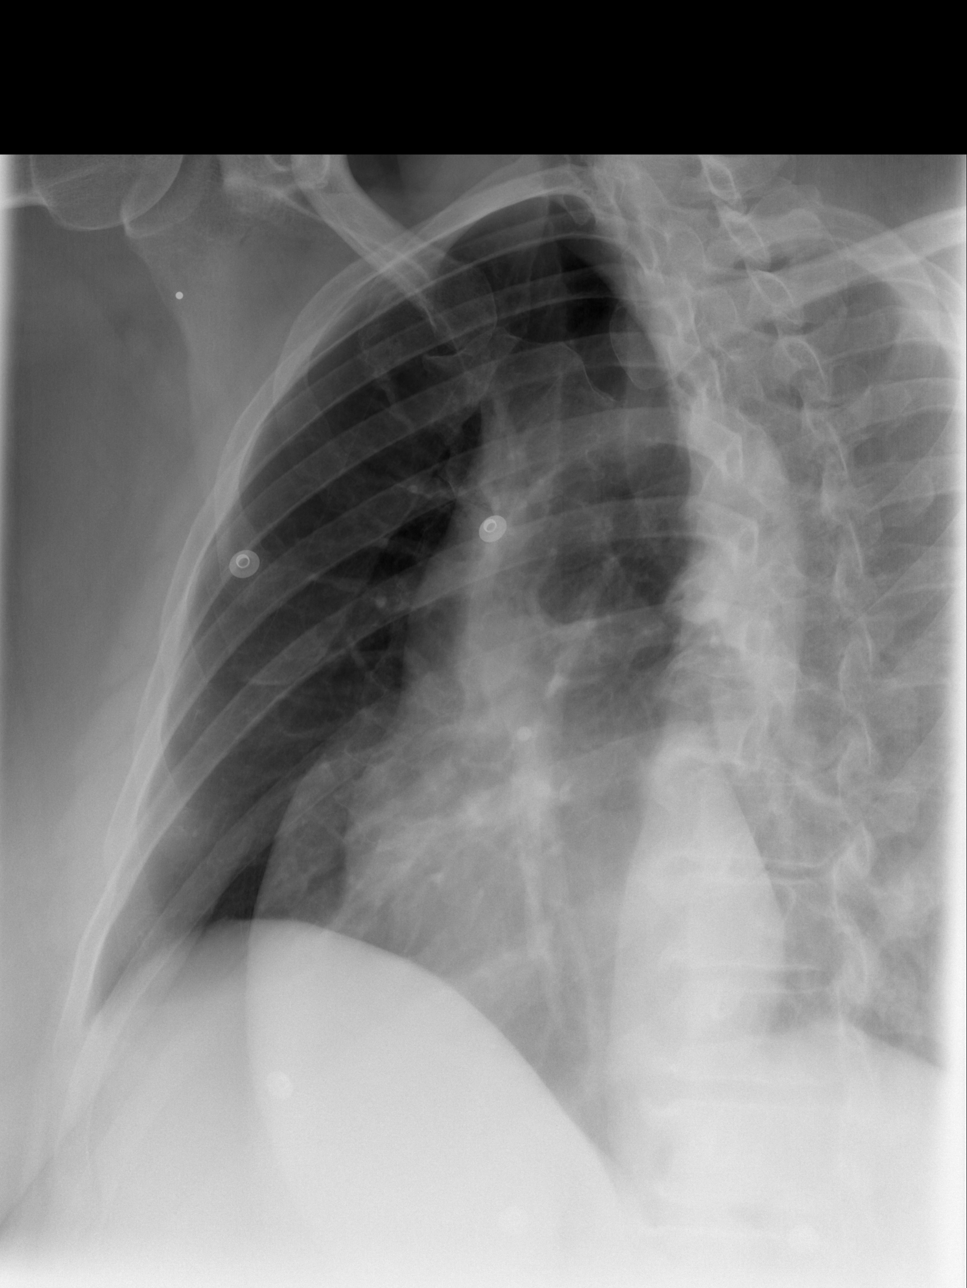

[3 of 3 positions shown; findings below may reference images not displayed]

FINDINGS: Heart size is mildly enlarged.  There is no pulmonary
edema.  No focal consolidations or pleural effusions.  No free
intraperitoneal air beneath the diaphragm.

Oblique views of the right ribs show no evidence for acute fracture
or dislocation.  No evidence for pneumothorax.
IMPRESSION: 1.  Cardiomegaly.
2.  No evidence for rib fracture.

## 2011-01-03 ENCOUNTER — Emergency Department (HOSPITAL_COMMUNITY): Payer: Medicare Other

## 2011-01-03 ENCOUNTER — Emergency Department (HOSPITAL_COMMUNITY)
Admission: EM | Admit: 2011-01-03 | Discharge: 2011-01-03 | Disposition: A | Discharge status: Home / Self Care | Payer: Medicare Other | Attending: Emergency Medicine | Admitting: Emergency Medicine

## 2011-01-03 DIAGNOSIS — I10 Essential (primary) hypertension: Secondary | ICD-10-CM | POA: Insufficient documentation

## 2011-01-03 DIAGNOSIS — E119 Type 2 diabetes mellitus without complications: Secondary | ICD-10-CM | POA: Insufficient documentation

## 2011-01-03 DIAGNOSIS — Z79899 Other long term (current) drug therapy: Secondary | ICD-10-CM | POA: Insufficient documentation

## 2011-01-03 DIAGNOSIS — R05 Cough: Secondary | ICD-10-CM | POA: Insufficient documentation

## 2011-01-03 DIAGNOSIS — I509 Heart failure, unspecified: Secondary | ICD-10-CM | POA: Insufficient documentation

## 2011-01-03 DIAGNOSIS — I2510 Atherosclerotic heart disease of native coronary artery without angina pectoris: Secondary | ICD-10-CM | POA: Insufficient documentation

## 2011-01-03 DIAGNOSIS — J069 Acute upper respiratory infection, unspecified: Secondary | ICD-10-CM | POA: Insufficient documentation

## 2011-01-03 DIAGNOSIS — R059 Cough, unspecified: Secondary | ICD-10-CM | POA: Insufficient documentation

## 2011-01-15 ENCOUNTER — Emergency Department (HOSPITAL_COMMUNITY)
Admission: EM | Admit: 2011-01-15 | Discharge: 2011-01-15 | Disposition: A | Discharge status: Other Acute Inpatient Hospital | Payer: Medicare Other | Attending: Emergency Medicine | Admitting: Emergency Medicine

## 2011-01-15 ENCOUNTER — Emergency Department (HOSPITAL_COMMUNITY)
Admission: EM | Admit: 2011-01-15 | Discharge: 2011-01-15 | Disposition: A | Discharge status: Home / Self Care | Payer: Medicare Other | Attending: Emergency Medicine | Admitting: Emergency Medicine

## 2011-01-15 DIAGNOSIS — F209 Schizophrenia, unspecified: Secondary | ICD-10-CM | POA: Insufficient documentation

## 2011-01-15 DIAGNOSIS — IMO0002 Reserved for concepts with insufficient information to code with codable children: Secondary | ICD-10-CM | POA: Insufficient documentation

## 2011-01-15 DIAGNOSIS — I10 Essential (primary) hypertension: Secondary | ICD-10-CM | POA: Insufficient documentation

## 2011-01-15 DIAGNOSIS — I509 Heart failure, unspecified: Secondary | ICD-10-CM | POA: Insufficient documentation

## 2011-01-15 DIAGNOSIS — F259 Schizoaffective disorder, unspecified: Secondary | ICD-10-CM | POA: Insufficient documentation

## 2011-01-15 DIAGNOSIS — Z046 Encounter for general psychiatric examination, requested by authority: Secondary | ICD-10-CM | POA: Insufficient documentation

## 2011-01-15 DIAGNOSIS — I2510 Atherosclerotic heart disease of native coronary artery without angina pectoris: Secondary | ICD-10-CM | POA: Insufficient documentation

## 2011-01-15 DIAGNOSIS — E119 Type 2 diabetes mellitus without complications: Secondary | ICD-10-CM | POA: Insufficient documentation

## 2011-01-15 DIAGNOSIS — Z79899 Other long term (current) drug therapy: Secondary | ICD-10-CM | POA: Insufficient documentation

## 2011-01-15 LAB — COMPREHENSIVE METABOLIC PANEL
AST: 11 U/L (ref 0–37)
Albumin: 4.1 g/dL (ref 3.5–5.2)
Alkaline Phosphatase: 63 U/L (ref 39–117)
BUN: 5 mg/dL — ABNORMAL LOW (ref 6–23)
Chloride: 98 mEq/L (ref 96–112)
Potassium: 3.3 mEq/L — ABNORMAL LOW (ref 3.5–5.1)
Total Bilirubin: 0.3 mg/dL (ref 0.3–1.2)
Total Protein: 8.2 g/dL (ref 6.0–8.3)

## 2011-01-15 LAB — RAPID URINE DRUG SCREEN, HOSP PERFORMED
Amphetamines: NOT DETECTED
Barbiturates: NOT DETECTED
Benzodiazepines: NOT DETECTED
Cocaine: NOT DETECTED
Opiates: NOT DETECTED
Tetrahydrocannabinol: NOT DETECTED

## 2011-01-15 LAB — CBC
HCT: 35.2 % — ABNORMAL LOW (ref 36.0–46.0)
MCHC: 32.7 g/dL (ref 30.0–36.0)
Platelets: 305 10*3/uL (ref 150–400)
RDW: 13.8 % (ref 11.5–15.5)
WBC: 10.9 10*3/uL — ABNORMAL HIGH (ref 4.0–10.5)

## 2011-01-15 LAB — DIFFERENTIAL
Basophils Absolute: 0 10*3/uL (ref 0.0–0.1)
Basophils Relative: 0 % (ref 0–1)
Eosinophils Absolute: 0.1 10*3/uL (ref 0.0–0.7)
Eosinophils Relative: 1 % (ref 0–5)
Monocytes Absolute: 0.7 10*3/uL (ref 0.1–1.0)

## 2011-01-15 LAB — VALPROIC ACID LEVEL: Valproic Acid Lvl: <10 ug/mL — ABNORMAL LOW (ref 50.0–100.0)

## 2011-01-15 LAB — ETHANOL: Alcohol, Ethyl (B): <11 mg/dL (ref 0–11)

## 2011-02-04 IMAGING — CR DG CHEST 2V
2 series · 2 of 2 positions shown · non-contrast
Comparison: Chest and rib radiographs performed 08/28/2009

CLINICAL DATA: Mid chest pain and fever.

CHEST - 2 VIEW

[w chest lat]
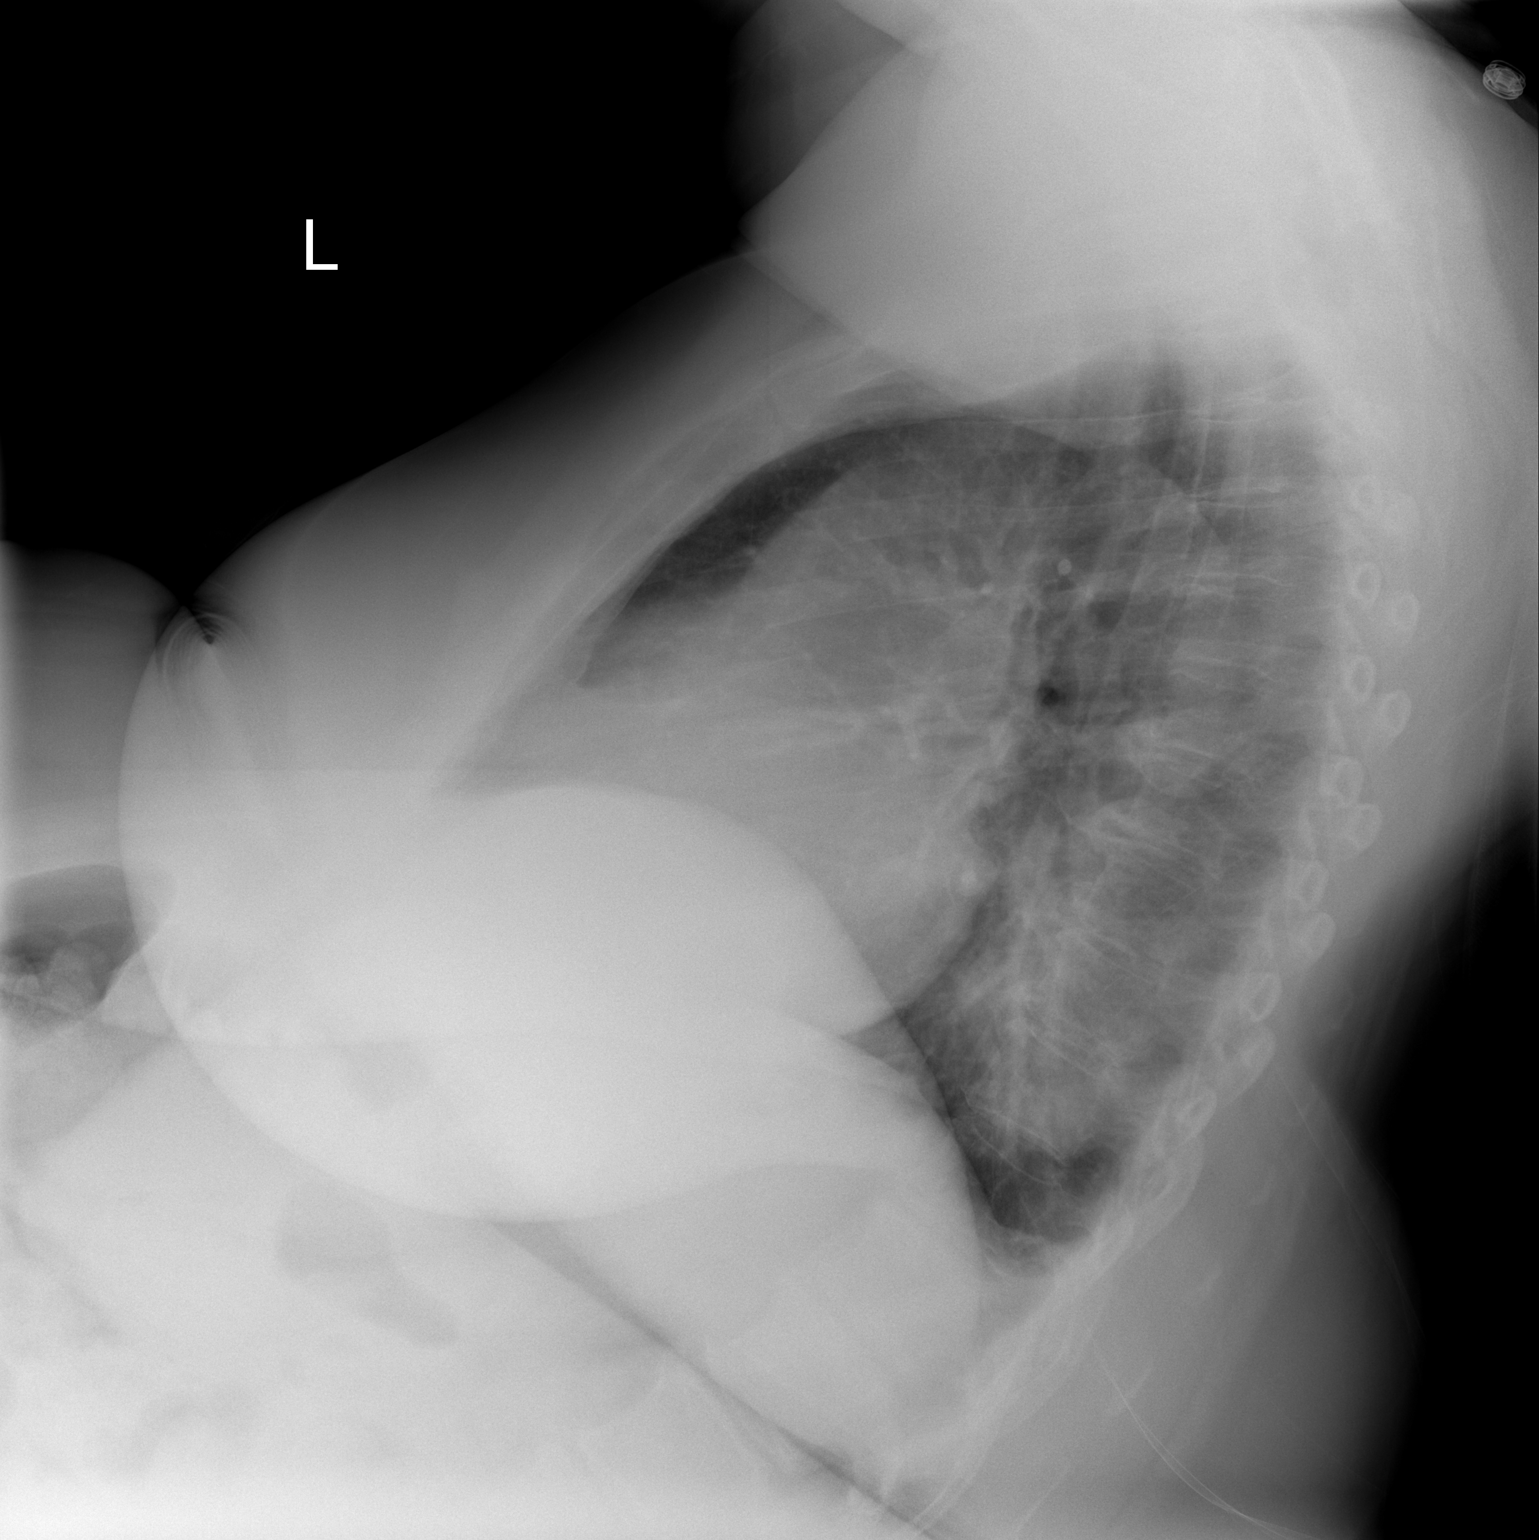

[w chest pa]
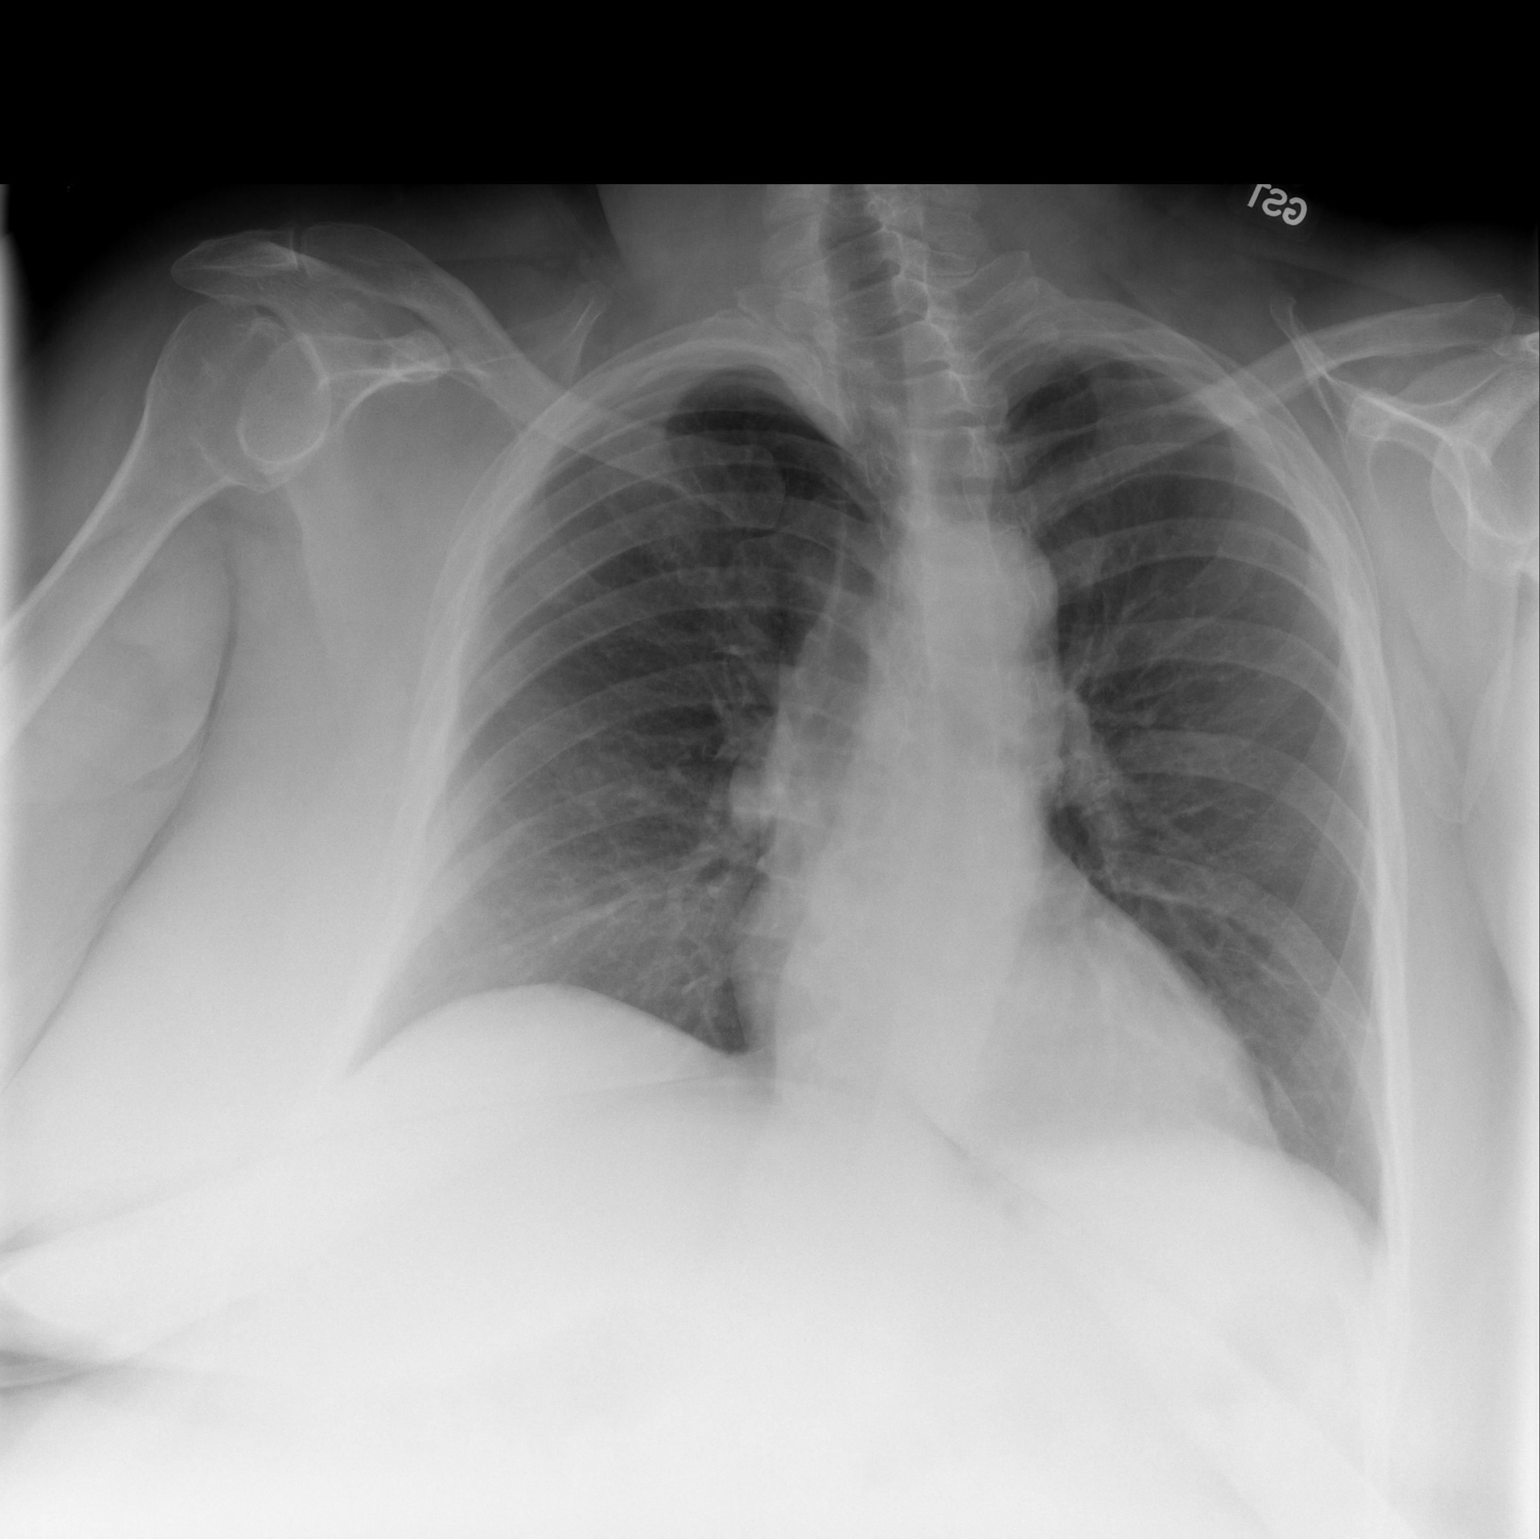

[2 of 2 positions shown; findings below may reference images not displayed]

FINDINGS: The lungs are well-aerated.  On the lateral view, there
is question of opacity within the left lower lobe, raising concern
for mild pneumonia.  There is no evidence of pleural effusion or
pneumothorax.

The heart is normal in size; the mediastinal contour is within
normal limits.  No acute osseous abnormalities are seen.
IMPRESSION: Concern for mild left lower lobe pneumonia.

## 2011-02-07 ENCOUNTER — Emergency Department (HOSPITAL_COMMUNITY)
Admission: EM | Admit: 2011-02-07 | Discharge: 2011-02-08 | Disposition: A | Discharge status: Home / Self Care | Payer: Medicare Other | Attending: Emergency Medicine | Admitting: Emergency Medicine

## 2011-02-07 DIAGNOSIS — I2510 Atherosclerotic heart disease of native coronary artery without angina pectoris: Secondary | ICD-10-CM | POA: Insufficient documentation

## 2011-02-07 DIAGNOSIS — I10 Essential (primary) hypertension: Secondary | ICD-10-CM | POA: Insufficient documentation

## 2011-02-07 DIAGNOSIS — N390 Urinary tract infection, site not specified: Secondary | ICD-10-CM | POA: Insufficient documentation

## 2011-02-07 DIAGNOSIS — E119 Type 2 diabetes mellitus without complications: Secondary | ICD-10-CM | POA: Insufficient documentation

## 2011-02-07 DIAGNOSIS — R109 Unspecified abdominal pain: Secondary | ICD-10-CM | POA: Insufficient documentation

## 2011-02-08 ENCOUNTER — Emergency Department (HOSPITAL_COMMUNITY): Payer: Medicare Other

## 2011-02-08 LAB — URINALYSIS, ROUTINE W REFLEX MICROSCOPIC
Nitrite: POSITIVE — AB
Protein, ur: NEGATIVE mg/dL
Specific Gravity, Urine: 1.007 (ref 1.005–1.030)
Urobilinogen, UA: 1 mg/dL (ref 0.0–1.0)

## 2011-02-08 LAB — DIFFERENTIAL
Eosinophils Absolute: 0.1 10*3/uL (ref 0.0–0.7)
Eosinophils Relative: 1 % (ref 0–5)
Lymphs Abs: 2.4 10*3/uL (ref 0.7–4.0)
Monocytes Absolute: 0.8 10*3/uL (ref 0.1–1.0)
Monocytes Relative: 9 % (ref 3–12)

## 2011-02-08 LAB — COMPREHENSIVE METABOLIC PANEL
AST: 9 U/L (ref 0–37)
CO2: 31 mEq/L (ref 19–32)
Calcium: 10.2 mg/dL (ref 8.4–10.5)
Creatinine, Ser: 1.08 mg/dL (ref 0.50–1.10)
GFR calc non Af Amer: 52 mL/min — ABNORMAL LOW (ref >60)
Total Protein: 7.7 g/dL (ref 6.0–8.3)

## 2011-02-08 LAB — CBC
MCH: 32.1 pg (ref 26.0–34.0)
MCHC: 32.1 g/dL (ref 30.0–36.0)
MCV: 100.3 fL — ABNORMAL HIGH (ref 78.0–100.0)
Platelets: 338 10*3/uL (ref 150–400)
RBC: 3.64 MIL/uL — ABNORMAL LOW (ref 3.87–5.11)
RDW: 14.1 % (ref 11.5–15.5)

## 2011-02-08 LAB — URINE MICROSCOPIC-ADD ON

## 2011-02-08 LAB — RAPID URINE DRUG SCREEN, HOSP PERFORMED
Amphetamines: NOT DETECTED
Tetrahydrocannabinol: NOT DETECTED

## 2011-02-10 LAB — URINE CULTURE

## 2011-02-14 ENCOUNTER — Emergency Department (HOSPITAL_COMMUNITY): Payer: Medicare Other

## 2011-02-14 ENCOUNTER — Emergency Department (HOSPITAL_COMMUNITY)
Admission: EM | Admit: 2011-02-14 | Discharge: 2011-02-14 | Disposition: A | Discharge status: Home / Self Care | Payer: Medicare Other | Attending: Emergency Medicine | Admitting: Emergency Medicine

## 2011-02-14 DIAGNOSIS — R079 Chest pain, unspecified: Secondary | ICD-10-CM | POA: Insufficient documentation

## 2011-02-14 DIAGNOSIS — R059 Cough, unspecified: Secondary | ICD-10-CM | POA: Insufficient documentation

## 2011-02-14 DIAGNOSIS — I2510 Atherosclerotic heart disease of native coronary artery without angina pectoris: Secondary | ICD-10-CM | POA: Insufficient documentation

## 2011-02-14 DIAGNOSIS — F319 Bipolar disorder, unspecified: Secondary | ICD-10-CM | POA: Insufficient documentation

## 2011-02-14 DIAGNOSIS — E119 Type 2 diabetes mellitus without complications: Secondary | ICD-10-CM | POA: Insufficient documentation

## 2011-02-14 DIAGNOSIS — I10 Essential (primary) hypertension: Secondary | ICD-10-CM | POA: Insufficient documentation

## 2011-02-14 DIAGNOSIS — R4182 Altered mental status, unspecified: Secondary | ICD-10-CM | POA: Insufficient documentation

## 2011-02-14 DIAGNOSIS — I509 Heart failure, unspecified: Secondary | ICD-10-CM | POA: Insufficient documentation

## 2011-02-14 DIAGNOSIS — Z8659 Personal history of other mental and behavioral disorders: Secondary | ICD-10-CM | POA: Insufficient documentation

## 2011-02-14 DIAGNOSIS — F29 Unspecified psychosis not due to a substance or known physiological condition: Secondary | ICD-10-CM | POA: Insufficient documentation

## 2011-02-14 DIAGNOSIS — R5381 Other malaise: Secondary | ICD-10-CM | POA: Insufficient documentation

## 2011-02-14 DIAGNOSIS — R05 Cough: Secondary | ICD-10-CM | POA: Insufficient documentation

## 2011-02-14 DIAGNOSIS — N289 Disorder of kidney and ureter, unspecified: Secondary | ICD-10-CM | POA: Insufficient documentation

## 2011-02-14 LAB — URINALYSIS, ROUTINE W REFLEX MICROSCOPIC
Glucose, UA: NEGATIVE mg/dL
Hgb urine dipstick: NEGATIVE
Specific Gravity, Urine: 1.015 (ref 1.005–1.030)
pH: 5.5 (ref 5.0–8.0)

## 2011-02-14 LAB — POCT I-STAT, CHEM 8
Chloride: 102 mEq/L (ref 96–112)
Creatinine, Ser: 1.6 mg/dL — ABNORMAL HIGH (ref 0.50–1.10)
Glucose, Bld: 95 mg/dL (ref 70–99)
Hemoglobin: 13.3 g/dL (ref 12.0–15.0)
Potassium: 4.6 mEq/L (ref 3.5–5.1)

## 2011-02-14 LAB — CBC
HCT: 36.4 % (ref 36.0–46.0)
MCV: 99.7 fL (ref 78.0–100.0)
RBC: 3.65 MIL/uL — ABNORMAL LOW (ref 3.87–5.11)
WBC: 5.7 10*3/uL (ref 4.0–10.5)

## 2011-02-18 ENCOUNTER — Emergency Department (HOSPITAL_COMMUNITY): Payer: Medicare Other

## 2011-02-18 ENCOUNTER — Emergency Department (HOSPITAL_COMMUNITY)
Admission: EM | Admit: 2011-02-18 | Discharge: 2011-02-18 | Discharge status: Against Medical Advice | Payer: Medicare Other | Attending: Emergency Medicine | Admitting: Emergency Medicine

## 2011-02-18 DIAGNOSIS — R5381 Other malaise: Secondary | ICD-10-CM | POA: Insufficient documentation

## 2011-02-18 DIAGNOSIS — R079 Chest pain, unspecified: Secondary | ICD-10-CM | POA: Insufficient documentation

## 2011-02-18 DIAGNOSIS — R0602 Shortness of breath: Secondary | ICD-10-CM | POA: Insufficient documentation

## 2011-02-18 LAB — COMPREHENSIVE METABOLIC PANEL
ALT: 6 U/L (ref 0–35)
AST: 8 U/L (ref 0–37)
CO2: 31 mEq/L (ref 19–32)
Calcium: 9.9 mg/dL (ref 8.4–10.5)
Chloride: 97 mEq/L (ref 96–112)
Creatinine, Ser: 0.87 mg/dL (ref 0.50–1.10)
GFR calc Af Amer: >60 mL/min (ref >60)
GFR calc non Af Amer: >60 mL/min (ref >60)
Glucose, Bld: 91 mg/dL (ref 70–99)
Total Bilirubin: 0.2 mg/dL — ABNORMAL LOW (ref 0.3–1.2)

## 2011-02-18 LAB — CBC
HCT: 35.3 % — ABNORMAL LOW (ref 36.0–46.0)
Hemoglobin: 11.7 g/dL — ABNORMAL LOW (ref 12.0–15.0)
MCH: 32.7 pg (ref 26.0–34.0)
MCHC: 33.1 g/dL (ref 30.0–36.0)
MCV: 98.6 fL (ref 78.0–100.0)
RDW: 14.2 % (ref 11.5–15.5)

## 2011-02-18 LAB — DIFFERENTIAL
Eosinophils Relative: 1 % (ref 0–5)
Lymphocytes Relative: 46 % (ref 12–46)
Lymphs Abs: 2.4 10*3/uL (ref 0.7–4.0)
Monocytes Absolute: 0.4 10*3/uL (ref 0.1–1.0)
Monocytes Relative: 8 % (ref 3–12)
Neutro Abs: 2.4 10*3/uL (ref 1.7–7.7)

## 2011-02-18 LAB — POCT I-STAT TROPONIN I

## 2011-02-24 IMAGING — US US ABDOMEN COMPLETE
1 series · 14 of 25 positions shown · non-contrast
Comparison: 11/06/2007

CLINICAL DATA: Chest pain. Nausea and vomiting.

COMPLETE ABDOMINAL ULTRASOUND

[Series 1: us abdomen complete · 0.32mm/px · 14 of 71 slices shown]
[im 1/71]
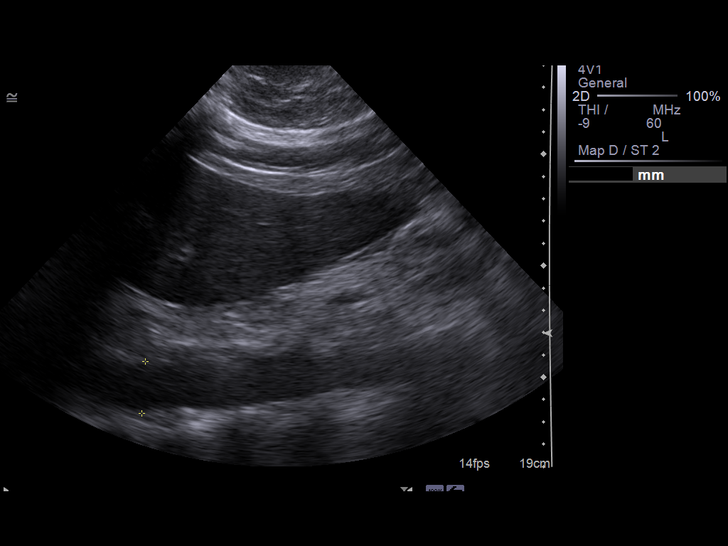
[im 6/71]
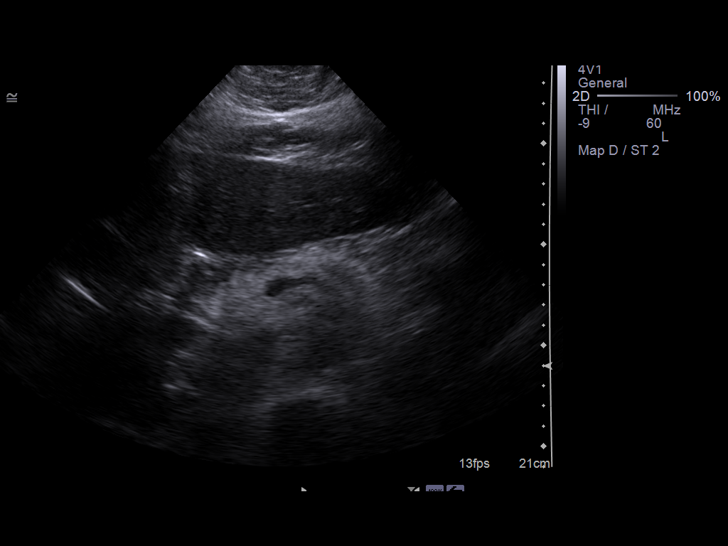
[im 12/71]
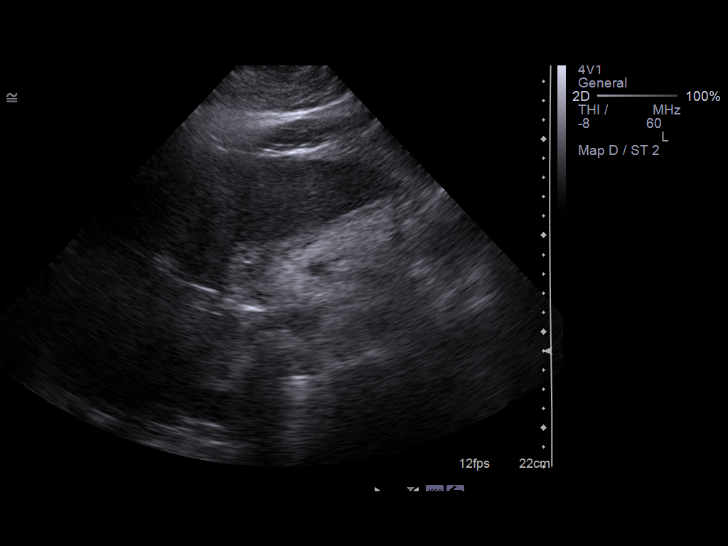
[im 18/71]
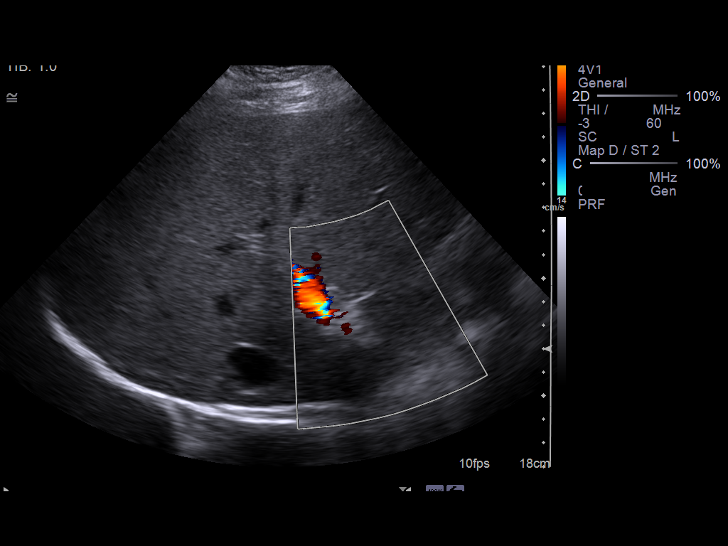
[im 24/71]
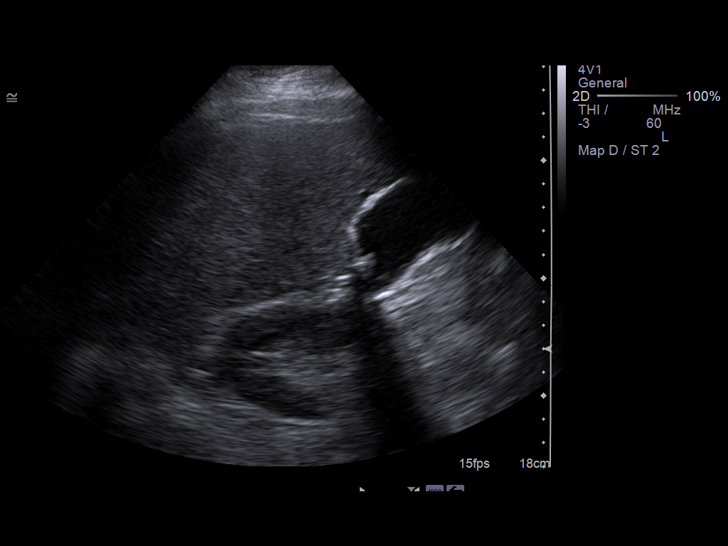
[im 27/71]
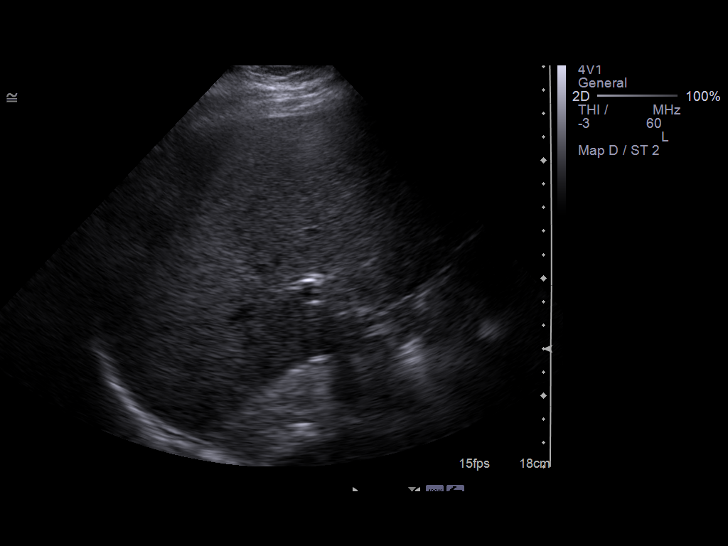
[im 33/71]
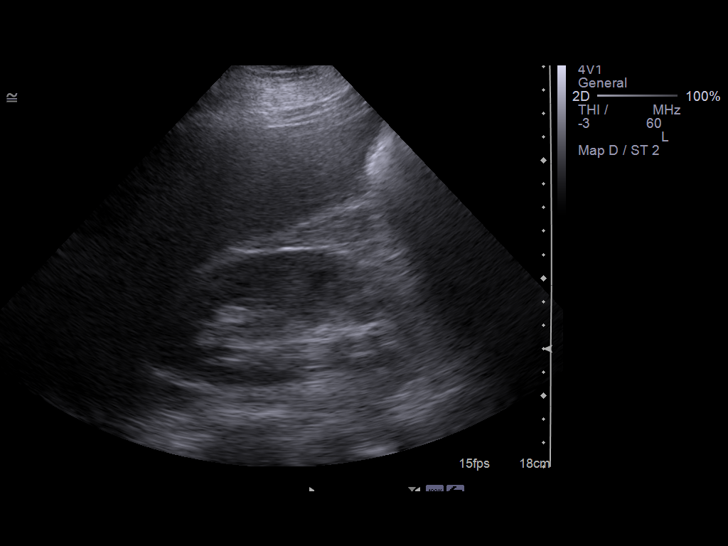
[im 38/71]
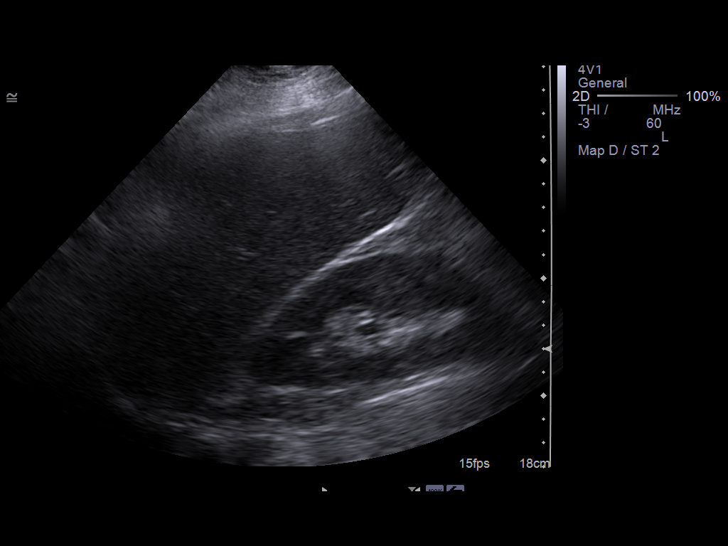
[im 44/71]
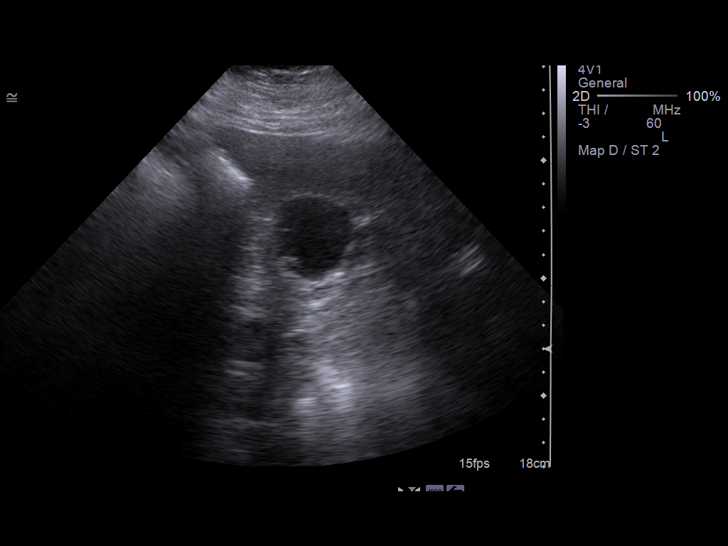
[im 47/71]
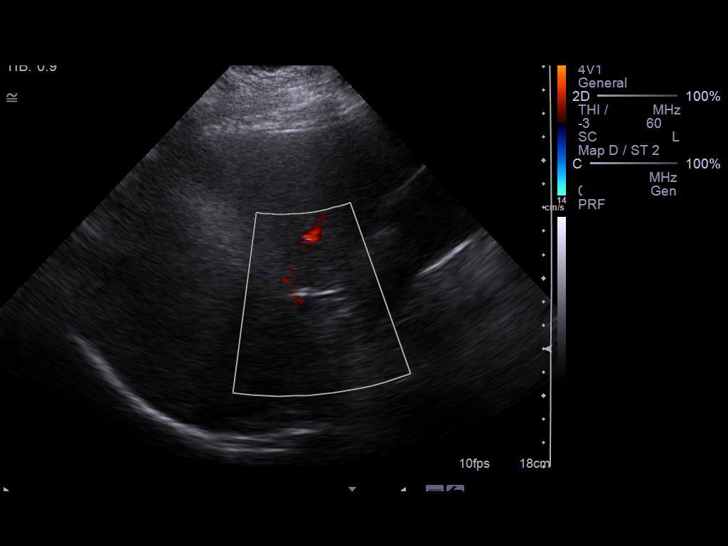
[im 53/71]
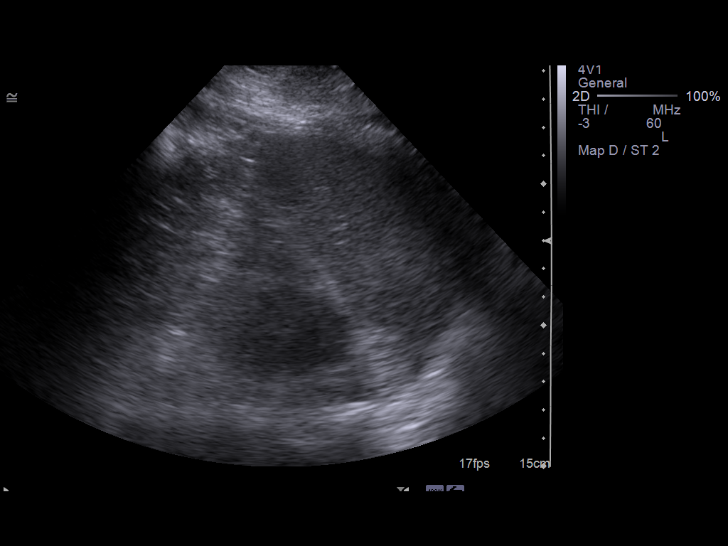
[im 59/71]
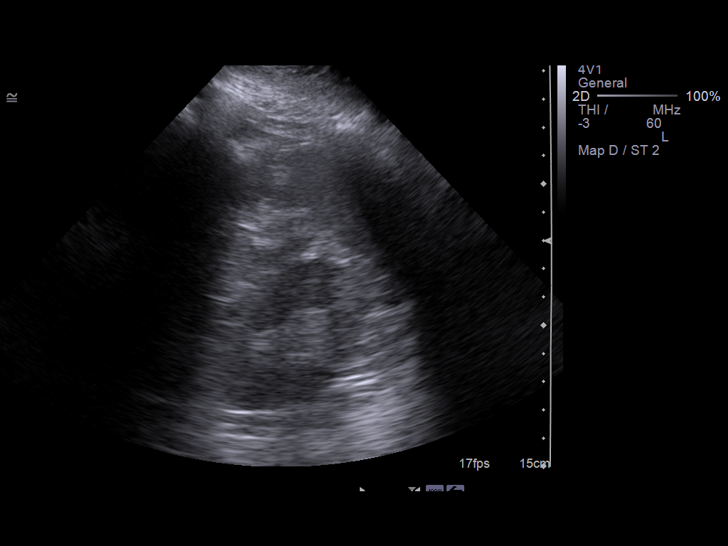
[im 65/71]
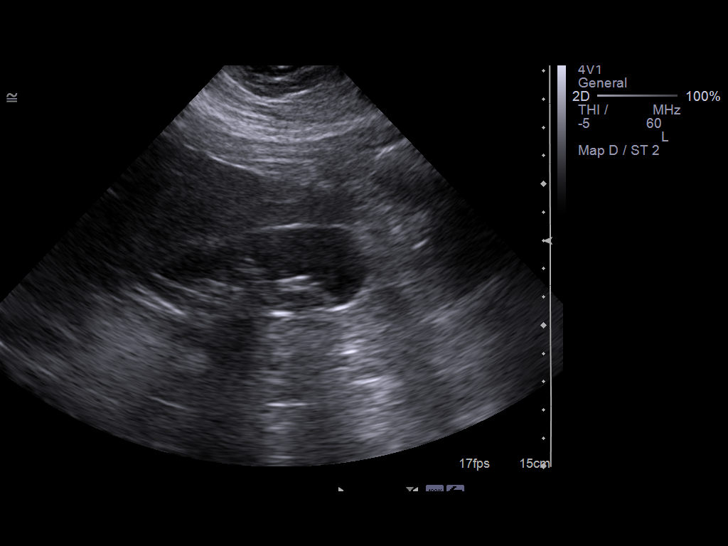
[im 71/71]
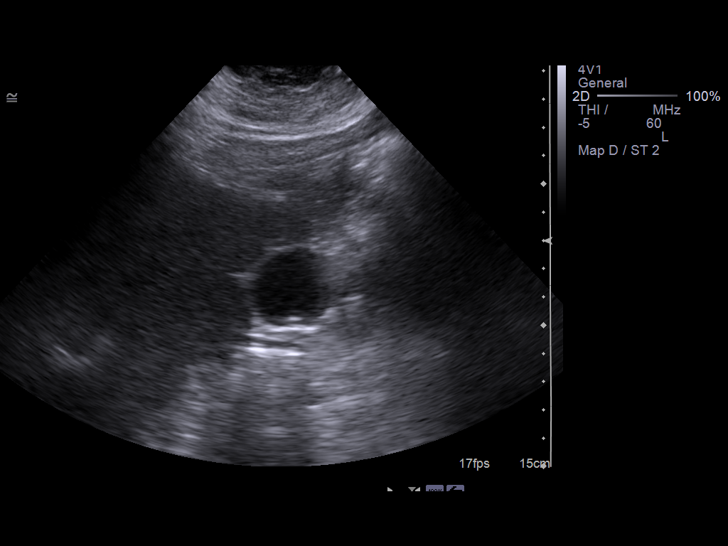

[14 of 25 positions shown; findings below may reference images not displayed]

FINDINGS: Gallbladder:  Cholelithiasis is present with multiple small
adjacent stones measuring up to approximately 7 mm in diameter.
Sonographic Murphy's sign is absent.  No gallbladder wall
thickening or pericholecystic fluid.

Common bile duct:  Measures 2 mm in diameter, within normal limits.

Liver:  No focal lesion identified.  Within normal limits in
parenchymal echogenicity.

IVC:  Appears normal.

Pancreas:  No focal abnormality seen.

Spleen:  Measures 5.3 cm craniocaudad and appears normal.

Right Kidney:  Measures 12.9 cm craniocaudad and appears normal.

Left Kidney:  Measures 12.2 cm craniocaudad and appears normal.

Abdominal aorta:  The proximal and mid abdominal aorta appear
normal.  The distal portion of the abdominal aorta was not well
seen due to overlying bowel gas.
IMPRESSION: 1.  Cholelithiasis.  Otherwise negative exam.

## 2011-02-24 IMAGING — CR DG CHEST 2V
1 series · 1 of 1 positions shown · non-contrast
Comparison: 10/07/2009.

CLINICAL DATA: Chest pain.  Short of breath.  Pneumonia.

CHEST - 2 VIEW

[w chest lat]
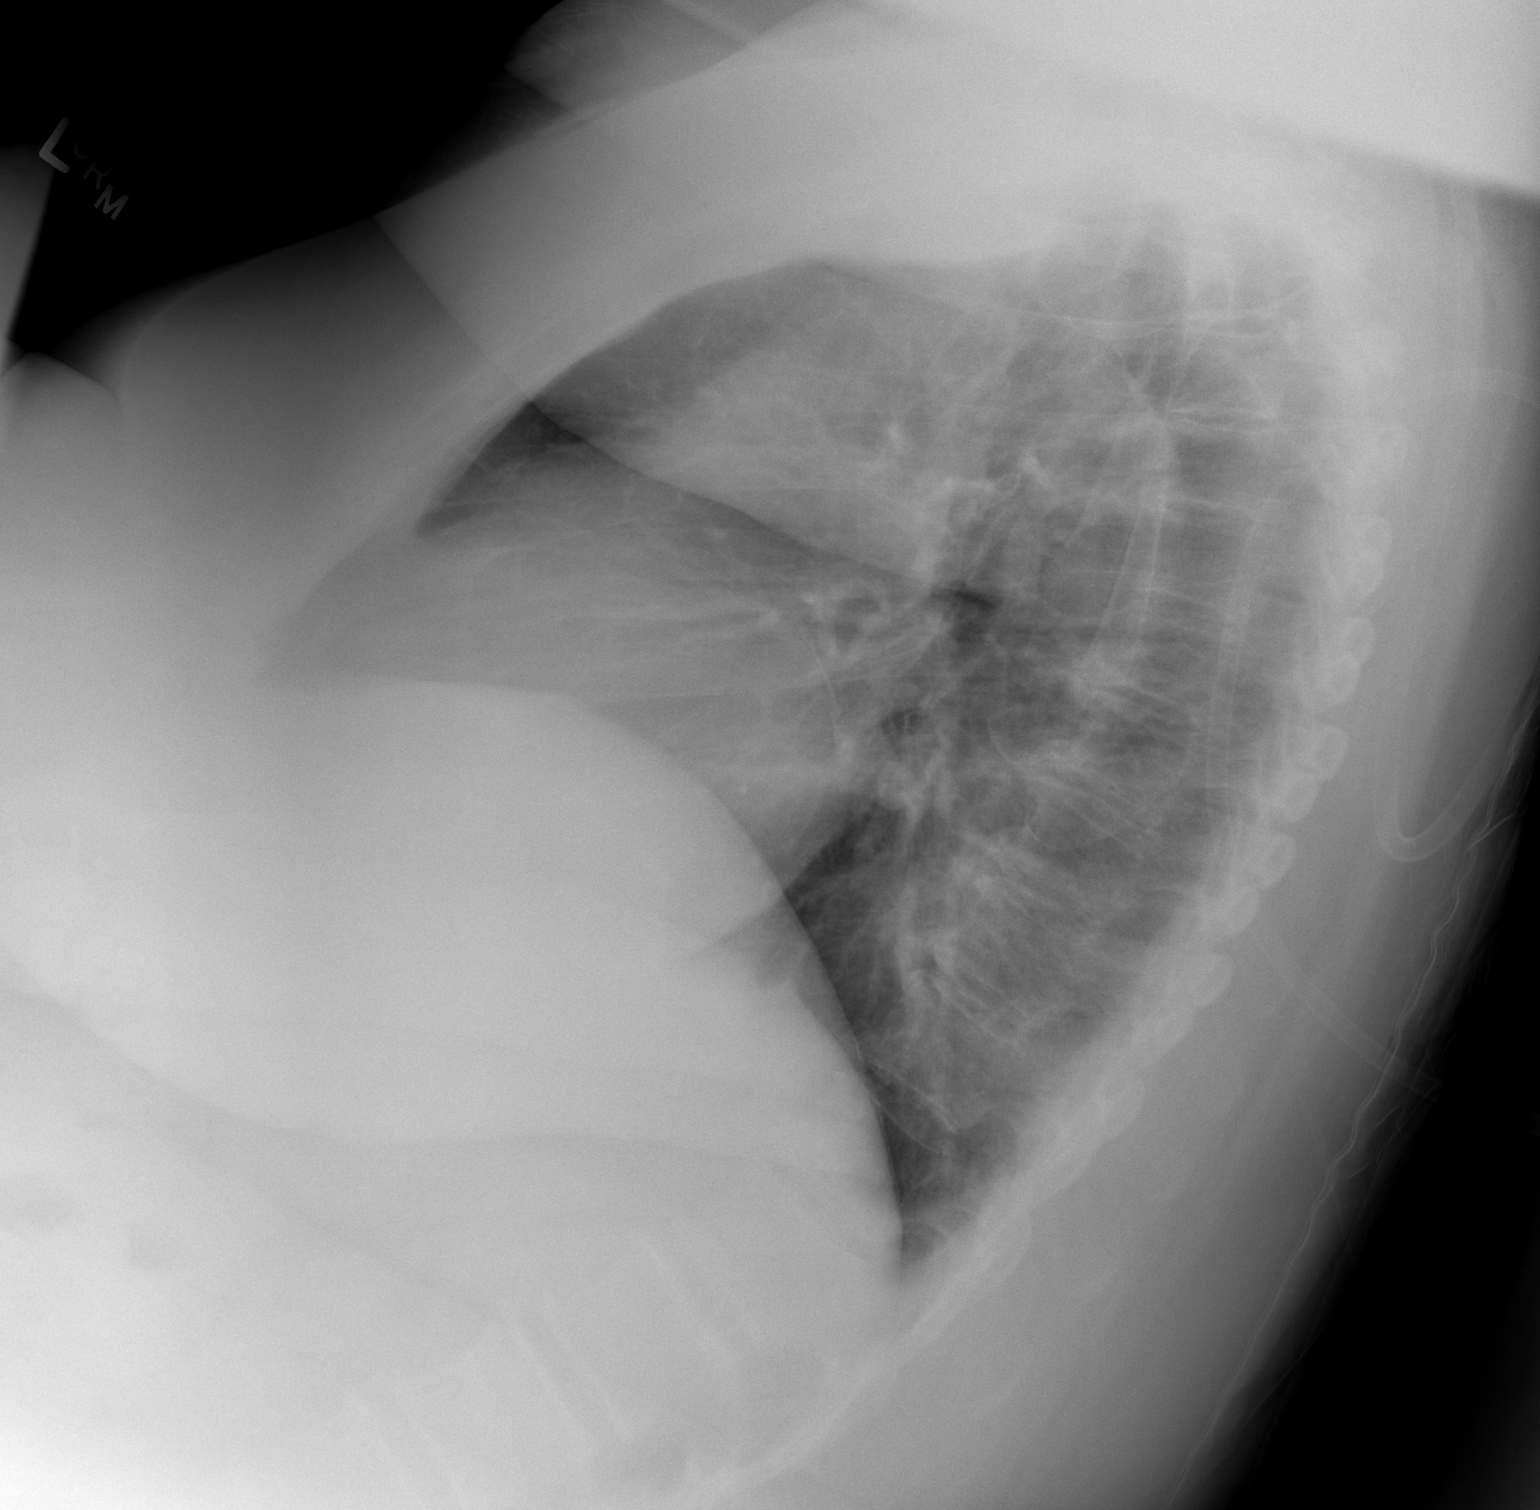

[1 of 1 positions shown; findings below may reference images not displayed]

FINDINGS: Borderline heart size for projection.  No airspace
disease.  No effusion.  Left lung base excluded from view on the
frontal.  Medial right basilar atelectasis.
IMPRESSION: Borderline heart size without acute cardiopulmonary disease.

## 2011-02-25 IMAGING — NM NM HEPATO W/GB/PHARM/[PERSON_NAME]
3 series · 13 of 13 positions shown · non-contrast
Comparison: 10/27/2009

CLINICAL DATA: Gallstones

NUCLEAR MEDICINE HEPATOBILIARY IMAGING WITH GALLBLADDER EF
TECHNIQUE: Sequential images of the abdomen were obtained [DATE] minutes following intravenous administration of
radiopharmaceutical.  After slow intravenous infusion of 3.0 uCg
Cholecystokinin, gallbladder ejection fraction was determined.
Radiopharmaceutical:  5.5 mCi Nc-MMm Choletec

[he hepatobiliary · 3.43mm/px · 6 of 57 frames shown (1 of 2)]
[frame 5/57]
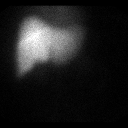
[frame 15/57]
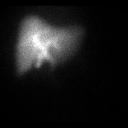
[frame 24/57]
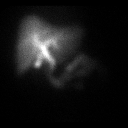
[frame 34/57]
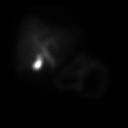
[frame 43/57]
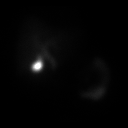
[frame 53/57]
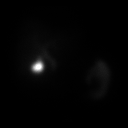

[he hepatobiliary · 1 of 1 slices shown (2 of 2)]
[im 1/1]
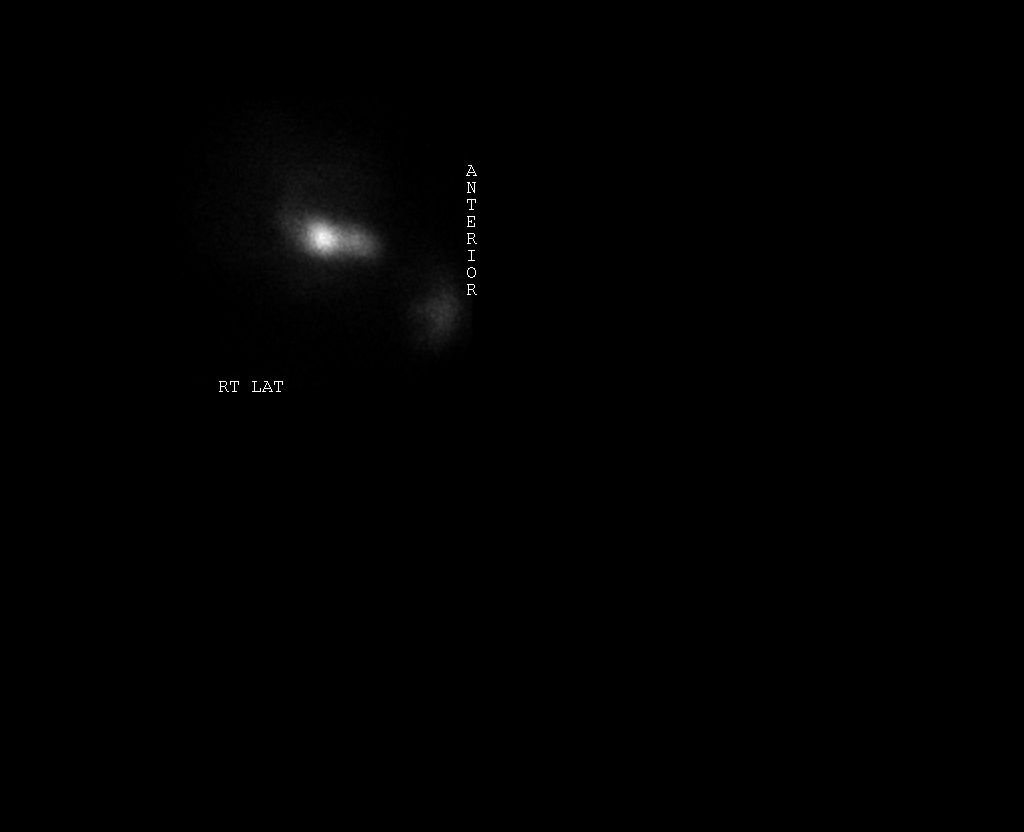

[gb e.f. flow · 5.01mm/px · 6 of 30 frames shown]
[frame 3/30]
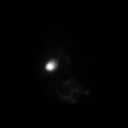
[frame 8/30]
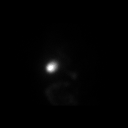
[frame 13/30]
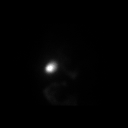
[frame 18/30]
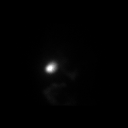
[frame 23/30]
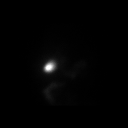
[frame 28/30]
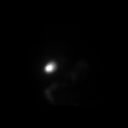

[13 of 13 positions shown; findings below may reference images not displayed]

FINDINGS: Following the intravenous administration of the
radiopharmaceutical there is uniform tracer uptake by the liver
with clearance from the blood pool.

Radiotracer accumulation within the gallbladder is noted by 15
minutes.

Gallbladder ejection fraction was calculated following CCK
administration.  The ejection fraction equals 0.5%.

The patient experienced no symptoms with CCK infusion.
IMPRESSION: 1.  Patent cystic duct without evidence for acute cholecystitis.
2.  Abnormally low gallbladder ejection fraction equal to 0.5%

## 2011-03-01 ENCOUNTER — Emergency Department (HOSPITAL_COMMUNITY)
Admission: EM | Admit: 2011-03-01 | Discharge: 2011-03-01 | Disposition: A | Discharge status: Home / Self Care | Payer: Medicare Other | Attending: Emergency Medicine | Admitting: Emergency Medicine

## 2011-03-01 DIAGNOSIS — F29 Unspecified psychosis not due to a substance or known physiological condition: Secondary | ICD-10-CM | POA: Insufficient documentation

## 2011-03-01 DIAGNOSIS — F22 Delusional disorders: Secondary | ICD-10-CM | POA: Insufficient documentation

## 2011-03-01 DIAGNOSIS — Z139 Encounter for screening, unspecified: Secondary | ICD-10-CM | POA: Insufficient documentation

## 2011-03-01 DIAGNOSIS — I10 Essential (primary) hypertension: Secondary | ICD-10-CM | POA: Insufficient documentation

## 2011-03-01 LAB — DIFFERENTIAL
Basophils Relative: 1 % (ref 0–1)
Eosinophils Absolute: 0.1 10*3/uL (ref 0.0–0.7)
Eosinophils Relative: 1 % (ref 0–5)
Lymphs Abs: 2.1 10*3/uL (ref 0.7–4.0)
Monocytes Relative: 10 % (ref 3–12)
Neutrophils Relative %: 52 % (ref 43–77)

## 2011-03-01 LAB — CBC
MCH: 32.5 pg (ref 26.0–34.0)
MCV: 99.2 fL (ref 78.0–100.0)
Platelets: 194 10*3/uL (ref 150–400)
RDW: 13.7 % (ref 11.5–15.5)

## 2011-03-01 LAB — POCT PREGNANCY, URINE: Preg Test, Ur: NEGATIVE

## 2011-03-01 LAB — COMPREHENSIVE METABOLIC PANEL
ALT: 6 U/L (ref 0–35)
AST: 10 U/L (ref 0–37)
Alkaline Phosphatase: 54 U/L (ref 39–117)
CO2: 28 mEq/L (ref 19–32)
Calcium: 9.9 mg/dL (ref 8.4–10.5)
Chloride: 98 mEq/L (ref 96–112)
GFR calc non Af Amer: >60 mL/min (ref >60)
Potassium: 4 mEq/L (ref 3.5–5.1)
Sodium: 134 mEq/L — ABNORMAL LOW (ref 135–145)
Total Bilirubin: 0.3 mg/dL (ref 0.3–1.2)

## 2011-03-01 LAB — URINALYSIS, ROUTINE W REFLEX MICROSCOPIC
Bilirubin Urine: NEGATIVE
Ketones, ur: NEGATIVE mg/dL
Leukocytes, UA: NEGATIVE
Nitrite: NEGATIVE
Protein, ur: NEGATIVE mg/dL
Urobilinogen, UA: 0.2 mg/dL (ref 0.0–1.0)

## 2011-03-01 LAB — RAPID URINE DRUG SCREEN, HOSP PERFORMED: Barbiturates: NOT DETECTED

## 2011-03-10 ENCOUNTER — Emergency Department (HOSPITAL_COMMUNITY): Payer: Medicare Other

## 2011-03-10 ENCOUNTER — Emergency Department (HOSPITAL_COMMUNITY)
Admission: EM | Admit: 2011-03-10 | Discharge: 2011-03-10 | Disposition: A | Discharge status: Home / Self Care | Payer: Medicare Other | Attending: Emergency Medicine | Admitting: Emergency Medicine

## 2011-03-10 DIAGNOSIS — R112 Nausea with vomiting, unspecified: Secondary | ICD-10-CM | POA: Insufficient documentation

## 2011-03-10 DIAGNOSIS — I129 Hypertensive chronic kidney disease with stage 1 through stage 4 chronic kidney disease, or unspecified chronic kidney disease: Secondary | ICD-10-CM | POA: Insufficient documentation

## 2011-03-10 DIAGNOSIS — I4891 Unspecified atrial fibrillation: Secondary | ICD-10-CM | POA: Insufficient documentation

## 2011-03-10 DIAGNOSIS — N189 Chronic kidney disease, unspecified: Secondary | ICD-10-CM | POA: Insufficient documentation

## 2011-03-10 DIAGNOSIS — K219 Gastro-esophageal reflux disease without esophagitis: Secondary | ICD-10-CM | POA: Insufficient documentation

## 2011-03-10 DIAGNOSIS — E039 Hypothyroidism, unspecified: Secondary | ICD-10-CM | POA: Insufficient documentation

## 2011-03-10 DIAGNOSIS — I252 Old myocardial infarction: Secondary | ICD-10-CM | POA: Insufficient documentation

## 2011-03-10 DIAGNOSIS — K921 Melena: Secondary | ICD-10-CM | POA: Insufficient documentation

## 2011-03-10 DIAGNOSIS — Z79899 Other long term (current) drug therapy: Secondary | ICD-10-CM | POA: Insufficient documentation

## 2011-03-10 LAB — COMPREHENSIVE METABOLIC PANEL WITH GFR
ALT: 11
AST: 14
Albumin: 3.2 — ABNORMAL LOW
Alkaline Phosphatase: 48
BUN: 5 — ABNORMAL LOW
CO2: 30
Calcium: 9.2
Chloride: 102
Creatinine, Ser: 0.75
GFR calc non Af Amer: >60
Glucose, Bld: 101 — ABNORMAL HIGH
Potassium: 3.9
Sodium: 138
Total Bilirubin: 0.2 — ABNORMAL LOW
Total Protein: 6.3

## 2011-03-10 LAB — CBC
HCT: 39
HCT: 34.4 % — ABNORMAL LOW (ref 36.0–46.0)
Hemoglobin: 12 g/dL (ref 12.0–15.0)
MCH: 33.1 pg (ref 26.0–34.0)
MCHC: 34
MCHC: 34.9 g/dL (ref 30.0–36.0)
MCV: 99.9
MCV: 95 fL (ref 78.0–100.0)
Platelets: 172
Platelets: 203
Platelets: 209 10*3/uL (ref 150–400)
RBC: 3.62 MIL/uL — ABNORMAL LOW (ref 3.87–5.11)
RDW: 14.1
RDW: 13.3 % (ref 11.5–15.5)
WBC: 5.9
WBC: 7.4
WBC: 7.6 10*3/uL (ref 4.0–10.5)

## 2011-03-10 LAB — POCT I-STAT CREATININE: Operator id: 282201

## 2011-03-10 LAB — DIFFERENTIAL
Basophils Absolute: 0 10*3/uL (ref 0.0–0.1)
Basophils Relative: 1 % (ref 0–1)
Eosinophils Absolute: 0.1
Eosinophils Absolute: 0.3 10*3/uL (ref 0.0–0.7)
Eosinophils Relative: 1
Eosinophils Relative: 3 % (ref 0–5)
Lymphocytes Relative: 31 % (ref 12–46)
Lymphs Abs: 3.2
Lymphs Abs: 2.3 10*3/uL (ref 0.7–4.0)
Monocytes Absolute: 0.8 10*3/uL (ref 0.1–1.0)
Monocytes Relative: 10 % (ref 3–12)
Neutro Abs: 4.3 10*3/uL (ref 1.7–7.7)
Neutrophils Relative %: 56 % (ref 43–77)

## 2011-03-10 LAB — CARDIAC PANEL(CRET KIN+CKTOT+MB+TROPI)
CK, MB: 1.6
CK, MB: 1.7
Relative Index: 1.6
Relative Index: 1.6
Relative Index: INVALID
Total CK: 101
Total CK: 82

## 2011-03-10 LAB — OCCULT BLOOD, POC DEVICE: Fecal Occult Bld: NEGATIVE

## 2011-03-10 LAB — LIPID PANEL
Cholesterol: 161
LDL Cholesterol: 94
Total CHOL/HDL Ratio: 4.9
Triglycerides: 170 — ABNORMAL HIGH
VLDL: 34

## 2011-03-10 LAB — POCT I-STAT, CHEM 8
BUN: 7 mg/dL (ref 6–23)
Calcium, Ion: 1.23 mmol/L (ref 1.12–1.32)
Chloride: 93 meq/L — ABNORMAL LOW (ref 96–112)
Creatinine, Ser: 0.9 mg/dL (ref 0.50–1.10)
Glucose, Bld: 96 mg/dL (ref 70–99)
HCT: 35 % — ABNORMAL LOW (ref 36.0–46.0)
Hemoglobin: 11.9 g/dL — ABNORMAL LOW (ref 12.0–15.0)
Potassium: 4.8 meq/L (ref 3.5–5.1)
Sodium: 128 meq/L — ABNORMAL LOW (ref 135–145)
TCO2: 29 mmol/L (ref 0–100)

## 2011-03-10 LAB — I-STAT 8, (EC8 V) (CONVERTED LAB)
Chloride: 103
Glucose, Bld: 108 — ABNORMAL HIGH
Potassium: 3.7
pH, Ven: 7.368 — ABNORMAL HIGH

## 2011-03-10 LAB — PROTIME-INR
INR: 1
Prothrombin Time: 13

## 2011-03-10 LAB — APTT: aPTT: 30

## 2011-03-10 LAB — POCT CARDIAC MARKERS
CKMB, poc: <1 — ABNORMAL LOW
Operator id: 282201
Troponin i, poc: <0.05

## 2011-03-10 LAB — POCT I-STAT TROPONIN I: Troponin i, poc: 0.01 ng/mL (ref 0.00–0.08)

## 2011-03-11 LAB — I-STAT 8, (EC8 V) (CONVERTED LAB)
BUN: <3 — ABNORMAL LOW
Chloride: 103
Glucose, Bld: 100 — ABNORMAL HIGH
HCT: 38
Hemoglobin: 12.9
Operator id: 277751
Sodium: 139
pCO2, Ven: 50.6 — ABNORMAL HIGH

## 2011-03-11 LAB — POCT CARDIAC MARKERS
CKMB, poc: <1 — ABNORMAL LOW
Operator id: 277751
Troponin i, poc: <0.05

## 2011-03-11 LAB — DIFFERENTIAL
Basophils Absolute: 0
Eosinophils Relative: 1
Lymphocytes Relative: 41
Lymphs Abs: 3.1
Neutro Abs: 3.6
Neutrophils Relative %: 48

## 2011-03-11 LAB — D-DIMER, QUANTITATIVE: D-Dimer, Quant: <0.22

## 2011-03-11 LAB — CBC
HCT: 36.1
Platelets: 182
RDW: 13.6
WBC: 7.5

## 2011-03-14 LAB — DIFFERENTIAL
Lymphocytes Relative: 43
Lymphs Abs: 2.6
Monocytes Absolute: 0.6
Monocytes Relative: 10
Neutro Abs: 2.7

## 2011-03-14 LAB — CBC
HCT: 36.7
Hemoglobin: 12.6
RBC: 3.67 — ABNORMAL LOW

## 2011-03-14 LAB — POCT CARDIAC MARKERS
CKMB, poc: <1 — ABNORMAL LOW
Operator id: 288331
Troponin i, poc: <0.05

## 2011-03-14 LAB — BASIC METABOLIC PANEL
Calcium: 9.6
GFR calc Af Amer: >60
GFR calc non Af Amer: >60
Potassium: 4.5
Sodium: 139

## 2011-03-15 LAB — BASIC METABOLIC PANEL
BUN: 6
CO2: 28
Chloride: 97
Creatinine, Ser: 0.74
Potassium: 3.2 — ABNORMAL LOW

## 2011-03-15 LAB — CBC
HCT: 35.6 — ABNORMAL LOW
Hemoglobin: 12.4
MCHC: 34.7
MCV: 99.8
Platelets: 226
RBC: 3.57 — ABNORMAL LOW
RDW: 13.9
WBC: 7.3

## 2011-03-15 LAB — D-DIMER, QUANTITATIVE: D-Dimer, Quant: 0.23

## 2011-03-15 LAB — DIFFERENTIAL
Basophils Absolute: 0
Basophils Relative: 0
Eosinophils Absolute: 0.1
Eosinophils Relative: 1
Lymphocytes Relative: 47 — ABNORMAL HIGH
Lymphs Abs: 3.4
Monocytes Absolute: 0.7
Monocytes Relative: 10
Neutro Abs: 3.1
Neutrophils Relative %: 42 — ABNORMAL LOW

## 2011-03-15 LAB — POCT CARDIAC MARKERS
CKMB, poc: <1 — ABNORMAL LOW
Myoglobin, poc: 39.4
Operator id: 272551
Operator id: 277751
Troponin i, poc: <0.05
Troponin i, poc: <0.05

## 2011-03-15 LAB — BASIC METABOLIC PANEL WITH GFR
Calcium: 8.8
GFR calc Af Amer: >60
GFR calc non Af Amer: >60
Glucose, Bld: 93
Sodium: 133 — ABNORMAL LOW

## 2011-03-15 LAB — OCCULT BLOOD X 1 CARD TO LAB, STOOL: Fecal Occult Bld: NEGATIVE

## 2011-03-18 LAB — CBC
Hemoglobin: 12.4
MCHC: 33.7
Platelets: 187
RDW: 13.4

## 2011-03-18 LAB — COMPREHENSIVE METABOLIC PANEL
ALT: 16
AST: 19
Albumin: 3.4 — ABNORMAL LOW
Alkaline Phosphatase: 40
Calcium: 9
GFR calc Af Amer: >60
Potassium: 3.7
Sodium: 137
Total Protein: 5.9 — ABNORMAL LOW

## 2011-03-18 LAB — POCT CARDIAC MARKERS
CKMB, poc: <1 — ABNORMAL LOW
CKMB, poc: <1 — ABNORMAL LOW
Myoglobin, poc: 18.4
Troponin i, poc: <0.05

## 2011-03-18 LAB — DIFFERENTIAL
Basophils Relative: 0
Eosinophils Absolute: 0
Lymphs Abs: 2
Monocytes Absolute: 0.4
Monocytes Relative: 9
Neutrophils Relative %: 47

## 2011-03-22 LAB — POCT I-STAT, CHEM 8
Calcium, Ion: 1.26
Chloride: 98
Glucose, Bld: 107 — ABNORMAL HIGH
HCT: 41
Hemoglobin: 13.9
TCO2: 30

## 2011-03-22 LAB — POCT CARDIAC MARKERS
CKMB, poc: <1 — ABNORMAL LOW
Myoglobin, poc: 23.4
Troponin i, poc: <0.05
Troponin i, poc: <0.05

## 2011-03-22 LAB — PROTIME-INR: INR: 1

## 2011-03-22 LAB — DIFFERENTIAL
Lymphocytes Relative: 52 — ABNORMAL HIGH
Lymphs Abs: 2.6
Monocytes Absolute: 0.6
Monocytes Relative: 12
Neutro Abs: 1.7
Neutrophils Relative %: 34 — ABNORMAL LOW

## 2011-03-22 LAB — BASIC METABOLIC PANEL
Calcium: 9.4
GFR calc Af Amer: >60
GFR calc non Af Amer: >60
Glucose, Bld: 102 — ABNORMAL HIGH
Sodium: 137

## 2011-03-22 LAB — CBC
Hemoglobin: 12.4
MCHC: 33.8
RBC: 3.63 — ABNORMAL LOW
RDW: 13.6

## 2011-03-22 LAB — TROPONIN I
Troponin I: 0.01
Troponin I: 0.01

## 2011-03-22 LAB — CARDIAC PANEL(CRET KIN+CKTOT+MB+TROPI)
CK, MB: 0.6
Relative Index: INVALID
Total CK: 32
Troponin I: <0.01

## 2011-03-22 LAB — GLUCOSE, CAPILLARY
Glucose-Capillary: 102 — ABNORMAL HIGH
Glucose-Capillary: 107 — ABNORMAL HIGH
Glucose-Capillary: 116 — ABNORMAL HIGH

## 2011-03-22 LAB — CK TOTAL AND CKMB (NOT AT ARMC)
CK, MB: 1
Relative Index: INVALID
Total CK: 69

## 2011-03-22 LAB — COMPREHENSIVE METABOLIC PANEL
Albumin: 3.5
BUN: 1 — ABNORMAL LOW
Calcium: 9.6
Glucose, Bld: 99
Total Protein: 6.2

## 2011-03-23 LAB — COMPREHENSIVE METABOLIC PANEL
ALT: 15
Calcium: 9
Creatinine, Ser: 0.66
Glucose, Bld: 105 — ABNORMAL HIGH
Sodium: 136
Total Protein: 6.2

## 2011-03-23 LAB — DIFFERENTIAL
Basophils Absolute: 0.1
Basophils Relative: 0
Eosinophils Absolute: 0
Eosinophils Absolute: 0
Eosinophils Relative: 1
Lymphocytes Relative: 47 — ABNORMAL HIGH
Lymphocytes Relative: 49 — ABNORMAL HIGH
Lymphs Abs: 2.8
Monocytes Absolute: 0.5
Monocytes Absolute: 0.6
Monocytes Relative: 10
Monocytes Relative: 11
Neutro Abs: 2.3
Neutrophils Relative %: 39 — ABNORMAL LOW

## 2011-03-23 LAB — CBC
HCT: 37.8
HCT: 38.4
Hemoglobin: 12.8
Hemoglobin: 13
MCHC: 33.9
MCHC: 34
MCV: 101.7 — ABNORMAL HIGH
Platelets: 157
RBC: 3.71 — ABNORMAL LOW
RBC: 3.7 — ABNORMAL LOW
RDW: 13.9
WBC: 5.9

## 2011-03-23 LAB — POCT I-STAT, CHEM 8
BUN: <3 — ABNORMAL LOW
Calcium, Ion: 1.25
Creatinine, Ser: 0.8
Creatinine, Ser: 0.8
Glucose, Bld: 87
Hemoglobin: 13.6
TCO2: 31
TCO2: 32

## 2011-03-23 LAB — CK TOTAL AND CKMB (NOT AT ARMC)
CK, MB: 0.6
Relative Index: INVALID
Total CK: 36

## 2011-03-23 LAB — POCT CARDIAC MARKERS
CKMB, poc: <1 — ABNORMAL LOW
CKMB, poc: <1 — ABNORMAL LOW
Myoglobin, poc: 18.5
Myoglobin, poc: 27.8

## 2011-03-23 LAB — D-DIMER, QUANTITATIVE: D-Dimer, Quant: 0.26

## 2011-03-23 LAB — TROPONIN I: Troponin I: 0.01

## 2011-03-25 LAB — CBC
HCT: 39.1 % (ref 36.0–46.0)
MCHC: 34.6 g/dL (ref 30.0–36.0)
MCV: 98.9 fL (ref 78.0–100.0)
Platelets: ADEQUATE 10*3/uL (ref 150–400)
RBC: 3.95 MIL/uL (ref 3.87–5.11)
WBC: 6.2 10*3/uL (ref 4.0–10.5)

## 2011-03-25 LAB — POCT I-STAT, CHEM 8
BUN: <3 mg/dL — ABNORMAL LOW (ref 6–23)
Chloride: 100 mEq/L (ref 96–112)
Glucose, Bld: 99 mg/dL (ref 70–99)
HCT: 40 % (ref 36.0–46.0)
Potassium: 3.9 mEq/L (ref 3.5–5.1)

## 2011-03-25 LAB — BASIC METABOLIC PANEL
BUN: <1 mg/dL — ABNORMAL LOW (ref 6–23)
CO2: 28 mEq/L (ref 19–32)
Chloride: 97 mEq/L (ref 96–112)
Potassium: 5.6 mEq/L — ABNORMAL HIGH (ref 3.5–5.1)

## 2011-03-25 LAB — HEPATIC FUNCTION PANEL
ALT: 14 U/L (ref 0–35)
AST: 45 U/L — ABNORMAL HIGH (ref 0–37)
Bilirubin, Direct: 1 mg/dL — ABNORMAL HIGH (ref 0.0–0.3)
Indirect Bilirubin: 0.9 mg/dL (ref 0.3–0.9)
Total Bilirubin: 1.9 mg/dL — ABNORMAL HIGH (ref 0.3–1.2)

## 2011-03-31 LAB — I-STAT 8, (EC8 V) (CONVERTED LAB)
Acid-Base Excess: 2
BUN: 6
BUN: <3 — ABNORMAL LOW
Bicarbonate: 27.4 — ABNORMAL HIGH
Bicarbonate: 28.8 — ABNORMAL HIGH
Chloride: 102
Glucose, Bld: 103 — ABNORMAL HIGH
HCT: 40
HCT: 41
Hemoglobin: 13.9
Operator id: 257131
Operator id: 146091
Potassium: 4
Sodium: 136
TCO2: 30
pCO2, Ven: 49.8
pCO2, Ven: 51 — ABNORMAL HIGH
pH, Ven: 7.36 — ABNORMAL HIGH

## 2011-03-31 LAB — DIFFERENTIAL
Eosinophils Absolute: 0
Eosinophils Relative: 1
Lymphs Abs: 2
Monocytes Absolute: 0.6
Monocytes Relative: 12 — ABNORMAL HIGH

## 2011-03-31 LAB — POCT CARDIAC MARKERS
CKMB, poc: <1 — ABNORMAL LOW
CKMB, poc: <1 — ABNORMAL LOW
Myoglobin, poc: 33.9
Operator id: 146091
Troponin i, poc: <0.05
Troponin i, poc: <0.05

## 2011-03-31 LAB — POCT I-STAT CREATININE
Creatinine, Ser: 0.6
Creatinine, Ser: 0.7
Operator id: 257131
Operator id: 146091

## 2011-03-31 LAB — CBC
HCT: 36.8
MCV: 99.8
RBC: 3.68 — ABNORMAL LOW
WBC: 5.2

## 2011-03-31 LAB — PROTIME-INR: INR: 0.9

## 2011-04-01 LAB — CBC
HCT: 35 — ABNORMAL LOW
MCHC: 34.4
MCV: 99
MCV: 99.5
Platelets: 197
Platelets: 217
RDW: 14.1 — ABNORMAL HIGH
RDW: 14.1 — ABNORMAL HIGH
WBC: 5.4

## 2011-04-01 LAB — COMPREHENSIVE METABOLIC PANEL
Alkaline Phosphatase: 50
BUN: <3 — ABNORMAL LOW
CO2: 28
Chloride: 104
Glucose, Bld: 102 — ABNORMAL HIGH
Potassium: 3.6
Total Bilirubin: 0.3

## 2011-04-01 LAB — I-STAT 8, (EC8 V) (CONVERTED LAB)
Acid-Base Excess: 2
Bicarbonate: 29.5 — ABNORMAL HIGH
HCT: 38
Operator id: 279831
pCO2, Ven: 55.6 — ABNORMAL HIGH
pH, Ven: 7.332 — ABNORMAL HIGH

## 2011-04-01 LAB — PROTIME-INR: Prothrombin Time: 13

## 2011-04-01 LAB — DIFFERENTIAL
Basophils Absolute: 0
Basophils Relative: 1
Eosinophils Absolute: 0.1
Eosinophils Relative: 1

## 2011-04-01 LAB — HEPATIC FUNCTION PANEL: Total Protein: 5.6 — ABNORMAL LOW

## 2011-04-01 LAB — BASIC METABOLIC PANEL
BUN: 3 — ABNORMAL LOW
Creatinine, Ser: 0.58
GFR calc non Af Amer: >60

## 2011-04-01 LAB — POCT CARDIAC MARKERS
CKMB, poc: <1 — ABNORMAL LOW
CKMB, poc: <1 — ABNORMAL LOW
Myoglobin, poc: 18.9
Troponin i, poc: <0.05

## 2011-04-01 LAB — TSH: TSH: 4.175

## 2011-04-01 LAB — POCT I-STAT CREATININE: Creatinine, Ser: 0.6

## 2011-04-01 LAB — LIPID PANEL
Cholesterol: 138
HDL: 36 — ABNORMAL LOW

## 2011-04-01 LAB — TROPONIN I: Troponin I: 0.01

## 2011-04-01 LAB — APTT: aPTT: 30

## 2011-04-04 LAB — I-STAT 8, (EC8 V) (CONVERTED LAB)
Acid-Base Excess: 2
BUN: <3 — ABNORMAL LOW
Bicarbonate: 27.7 — ABNORMAL HIGH
Bicarbonate: 25.9 — ABNORMAL HIGH
Chloride: 102
Glucose, Bld: 108 — ABNORMAL HIGH
HCT: 41
HCT: 41
Hemoglobin: 13.9
Hemoglobin: 13.9
Operator id: 272551
Operator id: 272551
Potassium: 3.5
Sodium: 138
TCO2: 29
pCO2, Ven: 45.5
pCO2, Ven: 43.7 — ABNORMAL LOW
pH, Ven: 7.393 — ABNORMAL HIGH
pH, Ven: 7.38 — ABNORMAL HIGH

## 2011-04-04 LAB — POCT CARDIAC MARKERS
Myoglobin, poc: 22.6
Myoglobin, poc: 23.1
Operator id: 272551
Troponin i, poc: <0.05

## 2011-04-04 LAB — COMPREHENSIVE METABOLIC PANEL WITH GFR
ALT: 13
Alkaline Phosphatase: 50
BUN: 2 — ABNORMAL LOW
CO2: 27
Calcium: 8.9
GFR calc non Af Amer: >60
Glucose, Bld: 100 — ABNORMAL HIGH
Potassium: 3.5
Sodium: 135
Total Protein: 6.2

## 2011-04-04 LAB — URINALYSIS, ROUTINE W REFLEX MICROSCOPIC
Bilirubin Urine: NEGATIVE
Bilirubin Urine: NEGATIVE
Bilirubin Urine: NEGATIVE
Glucose, UA: NEGATIVE
Glucose, UA: NEGATIVE
Glucose, UA: NEGATIVE
Hgb urine dipstick: NEGATIVE
Hgb urine dipstick: NEGATIVE
Ketones, ur: NEGATIVE
Ketones, ur: NEGATIVE
Ketones, ur: NEGATIVE
Leukocytes, UA: NEGATIVE
Nitrite: NEGATIVE
Protein, ur: NEGATIVE
Specific Gravity, Urine: 1.01
Specific Gravity, Urine: 1.005
Urobilinogen, UA: 0.2
pH: 7.5
pH: 7.5
pH: 7

## 2011-04-04 LAB — POCT I-STAT CREATININE
Creatinine, Ser: 0.7
Operator id: 272551

## 2011-04-04 LAB — URINE MICROSCOPIC-ADD ON

## 2011-04-04 LAB — DIFFERENTIAL
Basophils Absolute: 0.1
Basophils Absolute: 0
Basophils Relative: 0
Eosinophils Absolute: 0
Eosinophils Relative: 1
Eosinophils Relative: 1
Lymphocytes Relative: 38
Lymphocytes Relative: 43
Lymphs Abs: 2.7
Lymphs Abs: 2.4
Monocytes Absolute: 0.7
Monocytes Absolute: 0.6
Monocytes Relative: 9
Monocytes Relative: 11
Neutro Abs: 2.5
Neutrophils Relative %: 45

## 2011-04-04 LAB — COMPREHENSIVE METABOLIC PANEL
AST: 16
Albumin: 3.2 — ABNORMAL LOW
Chloride: 101
Creatinine, Ser: 0.59
GFR calc Af Amer: >60
Total Bilirubin: 0.5

## 2011-04-04 LAB — CBC
HCT: 36.2
HCT: 36.3
Hemoglobin: 12.6
Hemoglobin: 12.6
MCHC: 34.7
MCV: 97.3
Platelets: 176
RBC: 3.7 — ABNORMAL LOW
RBC: 3.73 — ABNORMAL LOW
RDW: 13.8
RDW: 14.1 — ABNORMAL HIGH
WBC: 5.5

## 2011-04-04 LAB — RAPID URINE DRUG SCREEN, HOSP PERFORMED
Benzodiazepines: NOT DETECTED
Cocaine: NOT DETECTED
Tetrahydrocannabinol: NOT DETECTED

## 2011-04-04 LAB — ETHANOL: Alcohol, Ethyl (B): <5

## 2011-04-04 LAB — LIPASE, BLOOD: Lipase: 21

## 2011-04-06 ENCOUNTER — Emergency Department (HOSPITAL_COMMUNITY)
Admission: EM | Admit: 2011-04-06 | Discharge: 2011-04-07 | Discharge status: Against Medical Advice | Payer: Medicare Other | Attending: Emergency Medicine | Admitting: Emergency Medicine

## 2011-04-06 DIAGNOSIS — T4271XA Poisoning by unspecified antiepileptic and sedative-hypnotic drugs, accidental (unintentional), initial encounter: Secondary | ICD-10-CM | POA: Insufficient documentation

## 2011-04-06 DIAGNOSIS — T426X4A Poisoning by other antiepileptic and sedative-hypnotic drugs, undetermined, initial encounter: Secondary | ICD-10-CM | POA: Insufficient documentation

## 2011-04-07 LAB — POCT I-STAT CREATININE
Creatinine, Ser: 0.7
Operator id: 277751

## 2011-04-07 LAB — DIFFERENTIAL
Eosinophils Absolute: 0.1
Lymphocytes Relative: 37
Lymphs Abs: 2.6
Neutro Abs: 3.6
Neutrophils Relative %: 51

## 2011-04-07 LAB — TSH: TSH: 5.509 — ABNORMAL HIGH

## 2011-04-07 LAB — CBC
MCV: 99.1
Platelets: 210
WBC: 6.9

## 2011-04-07 LAB — URINALYSIS, ROUTINE W REFLEX MICROSCOPIC
Bilirubin Urine: NEGATIVE
Glucose, UA: NEGATIVE
Ketones, ur: NEGATIVE
Nitrite: NEGATIVE
pH: 6.5

## 2011-04-07 LAB — I-STAT 8, (EC8 V) (CONVERTED LAB)
Acid-Base Excess: 2
Operator id: 277751
Potassium: 3.7
TCO2: 30
pH, Ven: 7.358 — ABNORMAL HIGH

## 2011-04-07 LAB — HEMATOCRIT: HCT: 37.8

## 2011-04-07 LAB — VALPROIC ACID LEVEL
Valproic Acid Lvl: 69.6
Valproic Acid Lvl: 40.4 — ABNORMAL LOW

## 2011-04-07 LAB — HEMOGLOBIN: Hemoglobin: 12.8

## 2011-04-07 LAB — URINE MICROSCOPIC-ADD ON

## 2011-04-23 ENCOUNTER — Emergency Department (HOSPITAL_COMMUNITY)
Admission: EM | Admit: 2011-04-23 | Discharge: 2011-04-23 | Disposition: A | Discharge status: Home / Self Care | Payer: Medicare Other | Attending: Emergency Medicine | Admitting: Emergency Medicine

## 2011-04-23 DIAGNOSIS — I252 Old myocardial infarction: Secondary | ICD-10-CM | POA: Insufficient documentation

## 2011-04-23 DIAGNOSIS — I4891 Unspecified atrial fibrillation: Secondary | ICD-10-CM | POA: Insufficient documentation

## 2011-04-23 DIAGNOSIS — F29 Unspecified psychosis not due to a substance or known physiological condition: Secondary | ICD-10-CM | POA: Insufficient documentation

## 2011-04-23 DIAGNOSIS — I10 Essential (primary) hypertension: Secondary | ICD-10-CM | POA: Insufficient documentation

## 2011-04-23 DIAGNOSIS — Z8659 Personal history of other mental and behavioral disorders: Secondary | ICD-10-CM | POA: Insufficient documentation

## 2011-04-23 DIAGNOSIS — E039 Hypothyroidism, unspecified: Secondary | ICD-10-CM | POA: Insufficient documentation

## 2011-04-23 DIAGNOSIS — IMO0002 Reserved for concepts with insufficient information to code with codable children: Secondary | ICD-10-CM | POA: Insufficient documentation

## 2011-04-23 DIAGNOSIS — K219 Gastro-esophageal reflux disease without esophagitis: Secondary | ICD-10-CM | POA: Insufficient documentation

## 2011-04-23 DIAGNOSIS — N390 Urinary tract infection, site not specified: Secondary | ICD-10-CM | POA: Insufficient documentation

## 2011-04-23 LAB — CBC
Platelets: 354 10*3/uL (ref 150–400)
RBC: 4.09 MIL/uL (ref 3.87–5.11)
WBC: 15.9 10*3/uL — ABNORMAL HIGH (ref 4.0–10.5)

## 2011-04-23 LAB — RAPID URINE DRUG SCREEN, HOSP PERFORMED
Amphetamines: NOT DETECTED
Barbiturates: NOT DETECTED
Cocaine: NOT DETECTED
Tetrahydrocannabinol: NOT DETECTED

## 2011-04-23 LAB — COMPREHENSIVE METABOLIC PANEL
ALT: 11 U/L (ref 0–35)
AST: 14 U/L (ref 0–37)
CO2: 21 mEq/L (ref 19–32)
Calcium: 10.5 mg/dL (ref 8.4–10.5)
Chloride: 103 mEq/L (ref 96–112)
GFR calc non Af Amer: 61 mL/min — ABNORMAL LOW (ref >90)
Sodium: 136 mEq/L (ref 135–145)
Total Bilirubin: 0.5 mg/dL (ref 0.3–1.2)

## 2011-04-23 LAB — DIFFERENTIAL
Basophils Absolute: 0.1 10*3/uL (ref 0.0–0.1)
Basophils Relative: 1 % (ref 0–1)
Eosinophils Absolute: 0 10*3/uL (ref 0.0–0.7)
Neutrophils Relative %: 71 % (ref 43–77)

## 2011-04-23 LAB — URINALYSIS, ROUTINE W REFLEX MICROSCOPIC
Glucose, UA: NEGATIVE mg/dL
Hgb urine dipstick: NEGATIVE
Protein, ur: 30 mg/dL — AB
pH: 6 (ref 5.0–8.0)

## 2011-04-23 LAB — URINE MICROSCOPIC-ADD ON

## 2011-04-25 ENCOUNTER — Emergency Department (HOSPITAL_COMMUNITY)
Admission: EM | Admit: 2011-04-25 | Discharge: 2011-04-25 | Disposition: A | Discharge status: Other Acute Inpatient Hospital | Payer: Medicare Other | Attending: Emergency Medicine | Admitting: Emergency Medicine

## 2011-04-25 ENCOUNTER — Other Ambulatory Visit: Payer: Self-pay

## 2011-04-25 ENCOUNTER — Emergency Department (HOSPITAL_COMMUNITY): Payer: Medicare Other

## 2011-04-25 ENCOUNTER — Encounter (HOSPITAL_COMMUNITY): Payer: Self-pay | Admitting: *Deleted

## 2011-04-25 DIAGNOSIS — I4891 Unspecified atrial fibrillation: Secondary | ICD-10-CM | POA: Insufficient documentation

## 2011-04-25 DIAGNOSIS — I252 Old myocardial infarction: Secondary | ICD-10-CM | POA: Insufficient documentation

## 2011-04-25 DIAGNOSIS — K219 Gastro-esophageal reflux disease without esophagitis: Secondary | ICD-10-CM | POA: Insufficient documentation

## 2011-04-25 DIAGNOSIS — F29 Unspecified psychosis not due to a substance or known physiological condition: Secondary | ICD-10-CM | POA: Insufficient documentation

## 2011-04-25 DIAGNOSIS — I10 Essential (primary) hypertension: Secondary | ICD-10-CM | POA: Insufficient documentation

## 2011-04-25 DIAGNOSIS — Z79899 Other long term (current) drug therapy: Secondary | ICD-10-CM | POA: Insufficient documentation

## 2011-04-25 DIAGNOSIS — E119 Type 2 diabetes mellitus without complications: Secondary | ICD-10-CM | POA: Insufficient documentation

## 2011-04-25 DIAGNOSIS — E669 Obesity, unspecified: Secondary | ICD-10-CM | POA: Insufficient documentation

## 2011-04-25 DIAGNOSIS — Z8659 Personal history of other mental and behavioral disorders: Secondary | ICD-10-CM | POA: Insufficient documentation

## 2011-04-25 HISTORY — DX: Gastro-esophageal reflux disease without esophagitis: K21.9

## 2011-04-25 HISTORY — DX: Hypothyroidism, unspecified: E03.9

## 2011-04-25 HISTORY — DX: Essential (primary) hypertension: I10

## 2011-04-25 HISTORY — DX: Urinary tract infection, site not specified: N39.0

## 2011-04-25 HISTORY — DX: Obesity, unspecified: E66.9

## 2011-04-25 HISTORY — DX: Unspecified atrial fibrillation: I48.91

## 2011-04-25 HISTORY — DX: Acute myocardial infarction, unspecified: I21.9

## 2011-04-25 IMAGING — CT CT HEAD W/O CM
1 of 2 series · 13 of 30 positions shown, 17 images · non-contrast
Comparison: 04/16/2008

CLINICAL DATA: Headache

CT HEAD WITHOUT CONTRAST
TECHNIQUE: Contiguous axial images were obtained from the base of
the skull through the vertex without contrast.

[Series 2: brain · axial · 0.47mm/px · z∈[+133,+257]mm · 13 of 28 slices shown, 17 images]
[im 2/28  brain]
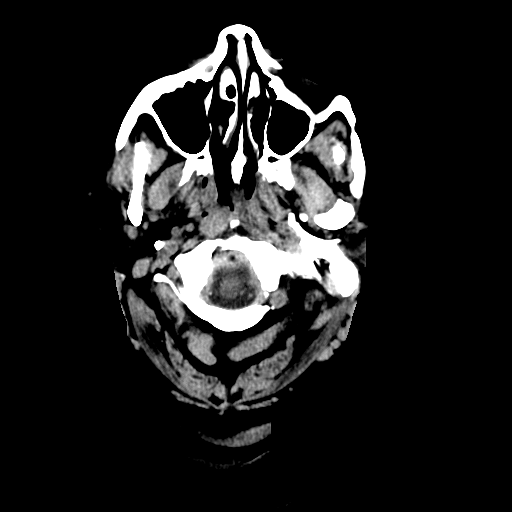
[im 2/28  bone]
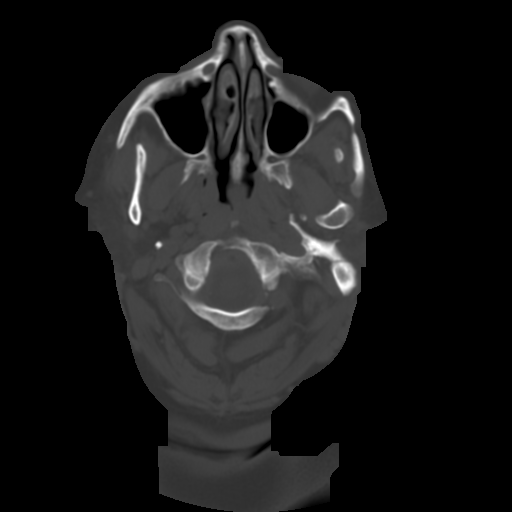
[im 4/28  brain]
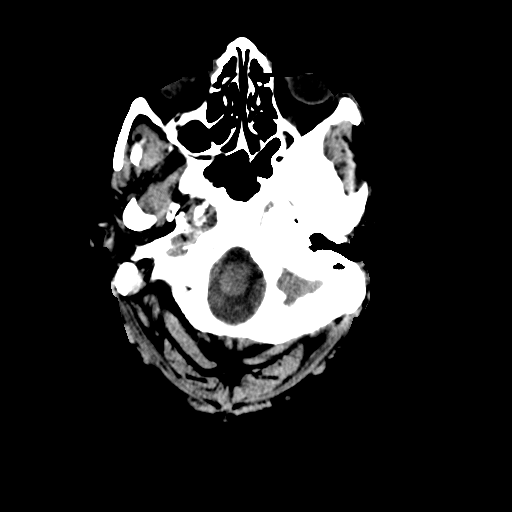
[im 6/28  brain]
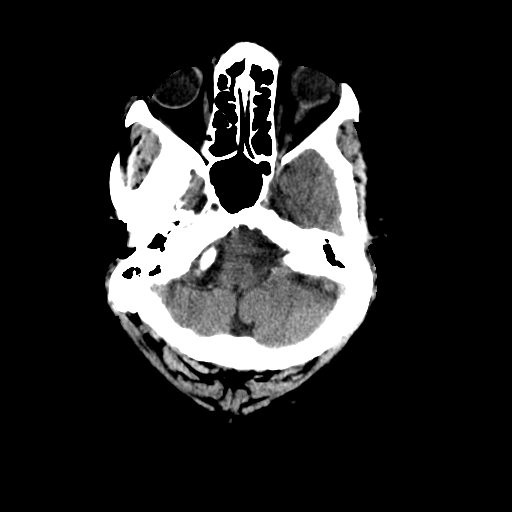
[im 8/28  brain]
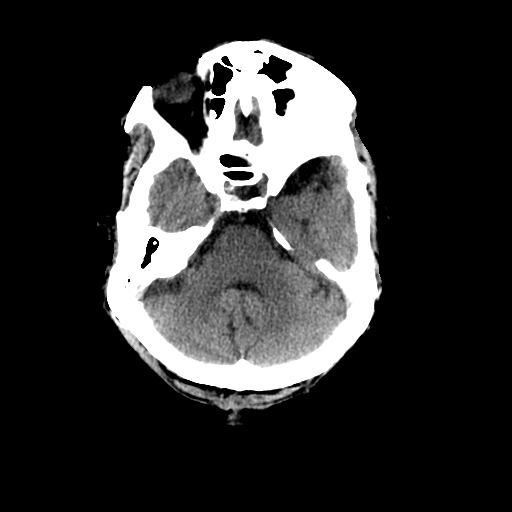
[im 10/28  brain]
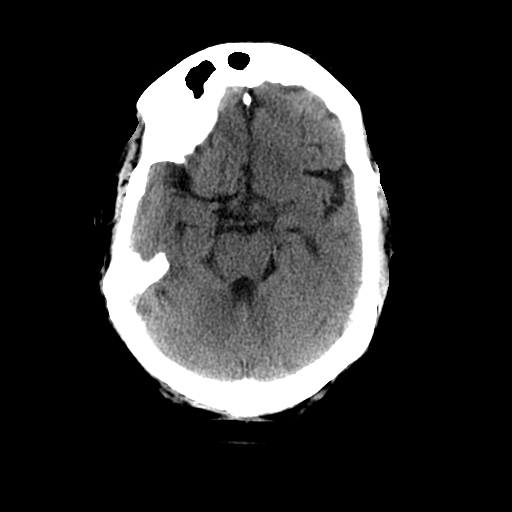
[im 10/28  bone]
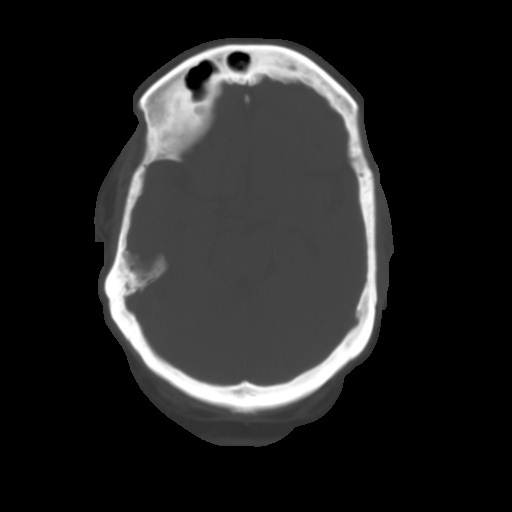
[im 12/28  brain]
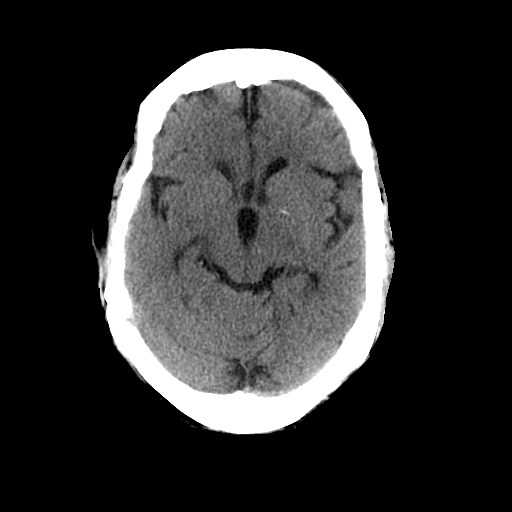
[im 14/28  brain]
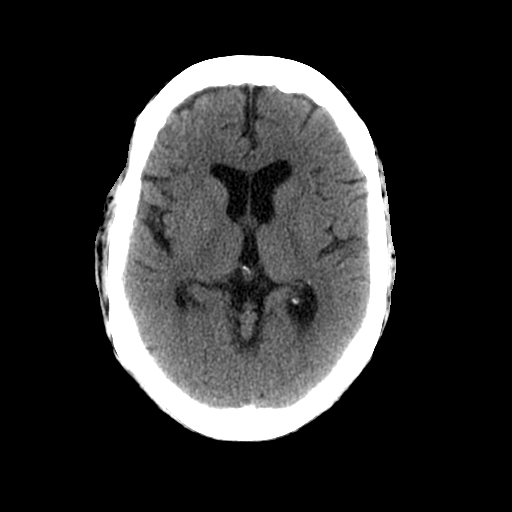
[im 16/28  brain]
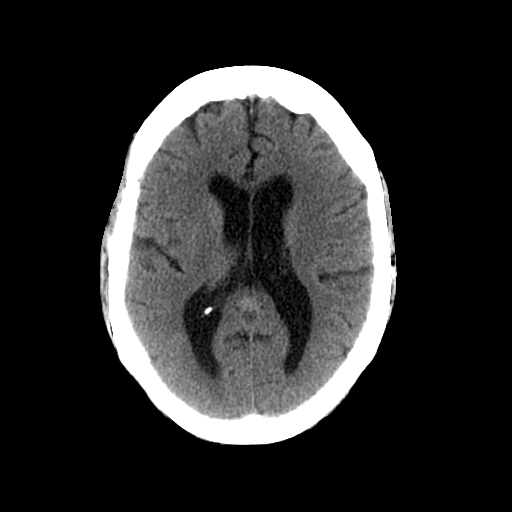
[im 18/28  brain]
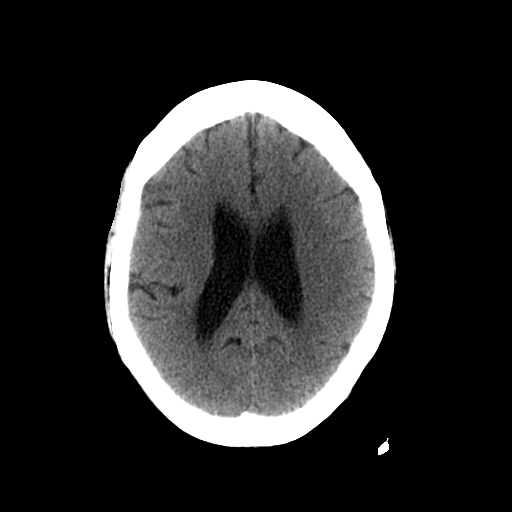
[im 18/28  bone]
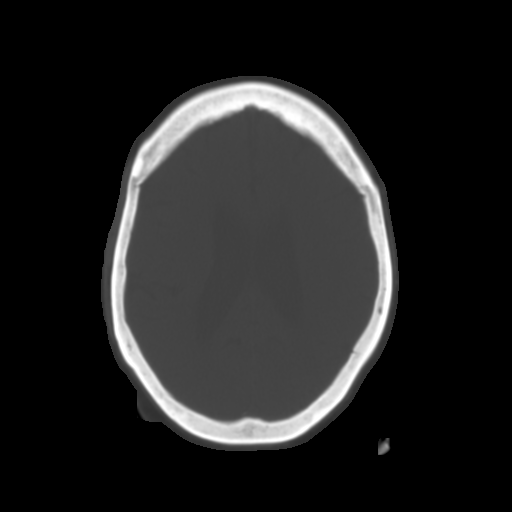
[im 20/28  brain]
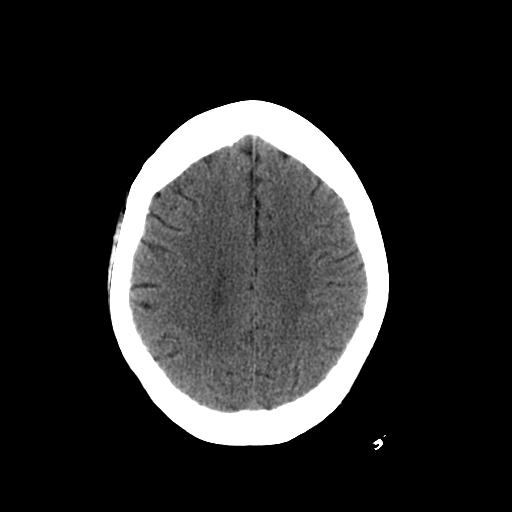
[im 22/28  brain]
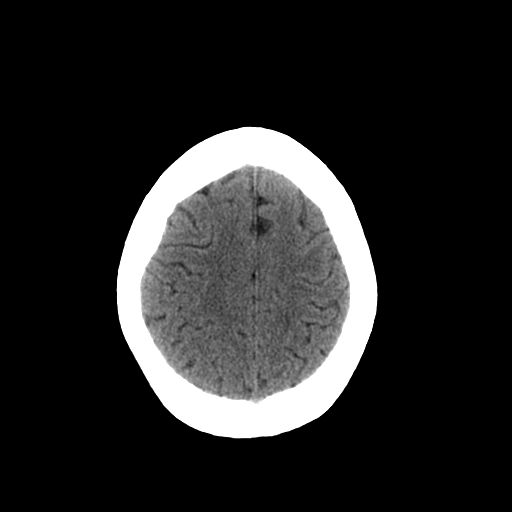
[im 24/28  brain]
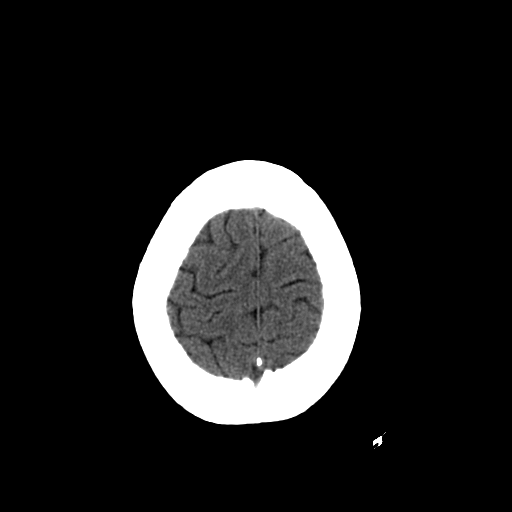
[im 26/28  brain]
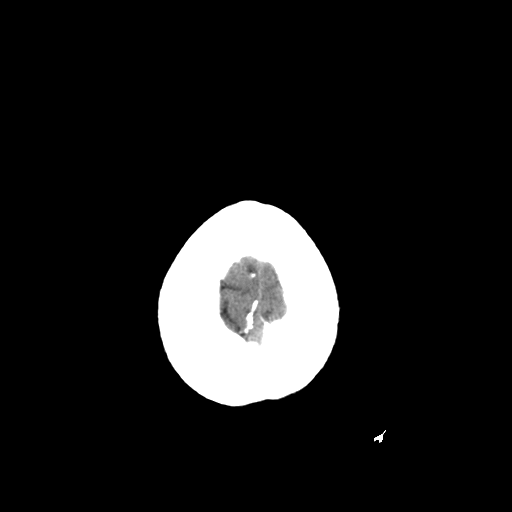
[im 26/28  bone]
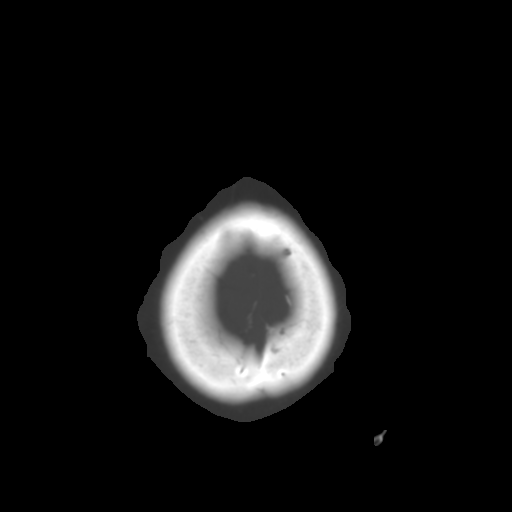

[13 of 30 positions shown; findings below may reference images not displayed]

FINDINGS: Mild generalized atrophy.
Normal ventricular morphology.
No midline shift or mass effect.
Otherwise normal appearance of brain parenchyma.
No intracranial hemorrhage, mass lesion, or acute infarction.
Visualized paranasal sinuses clear.
Probable osteoma of the frontal sinus.
Bones otherwise unremarkable.
IMPRESSION: No acute intracranial abnormalities.

## 2011-04-25 IMAGING — US US ABDOMEN COMPLETE
1 series · 14 of 25 positions shown · non-contrast
Comparison: 10/27/2009

CLINICAL DATA: Chest and abdominal pain

ULTRASOUND ABDOMEN:
TECHNIQUE: Sonography of upper abdominal structures was performed.

[Series 1: us abdomen complete · 0.35mm/px · 14 of 67 slices shown]
[im 1/67]
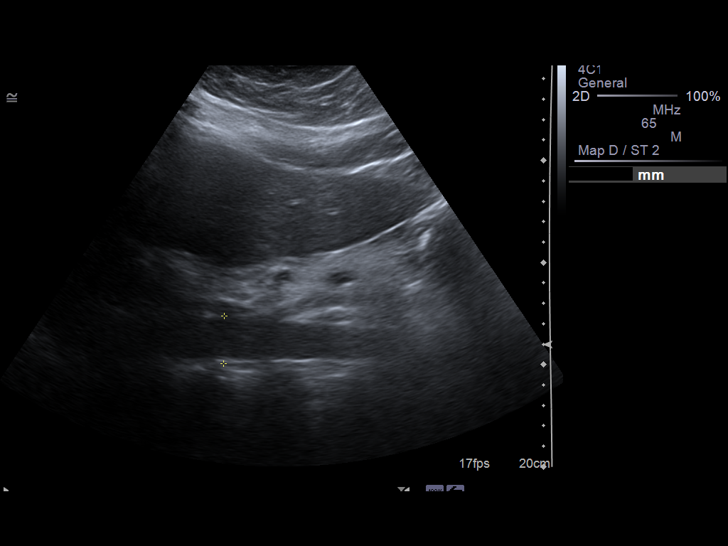
[im 6/67]
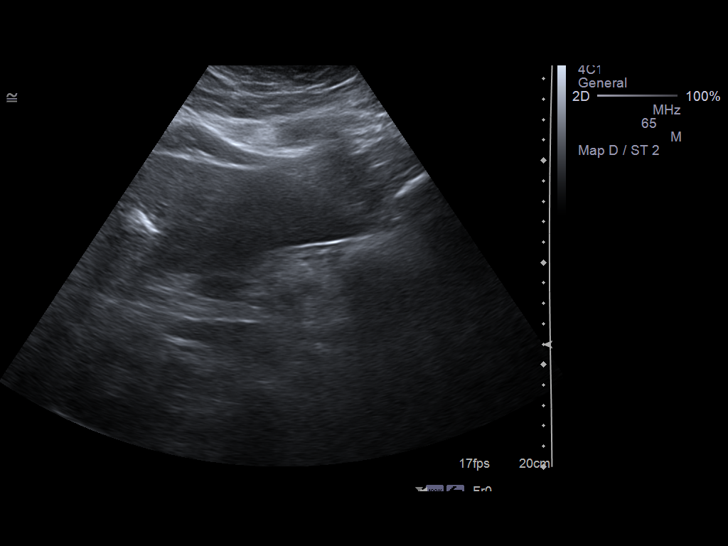
[im 12/67]
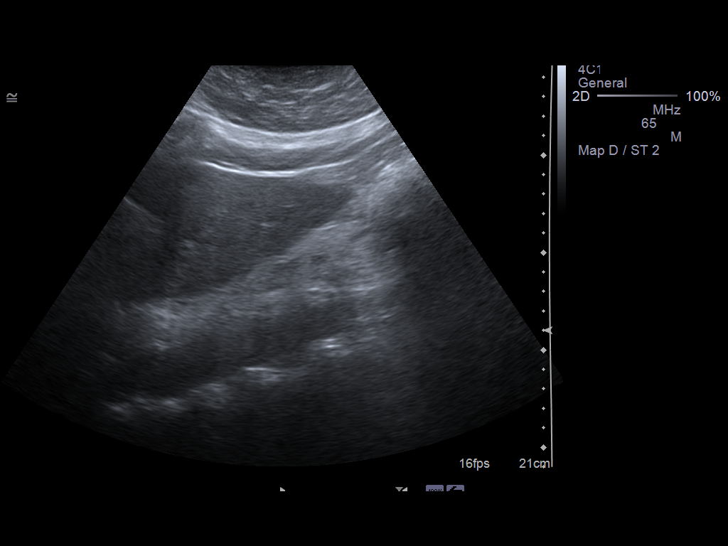
[im 17/67]
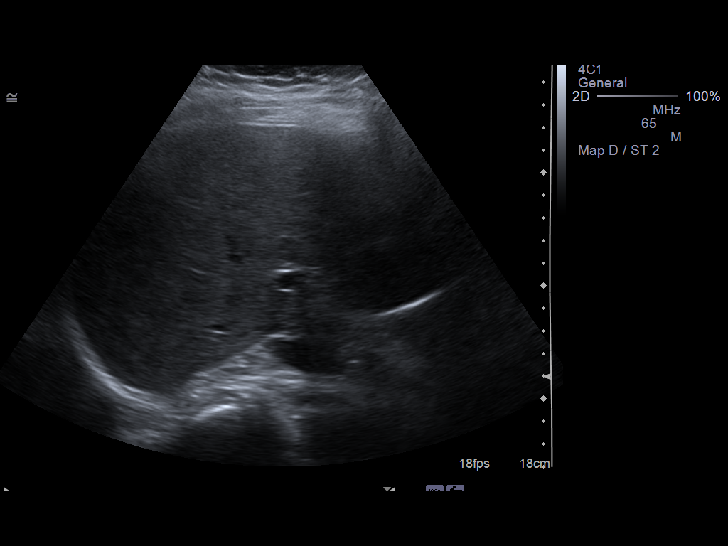
[im 23/67]
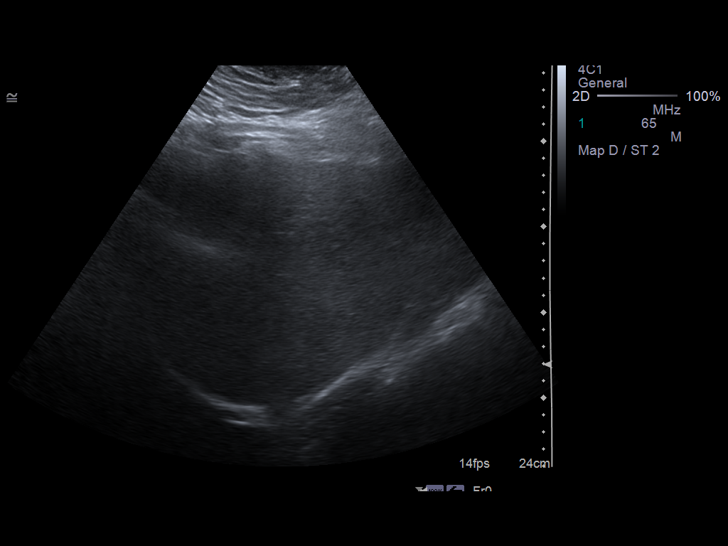
[im 25/67]
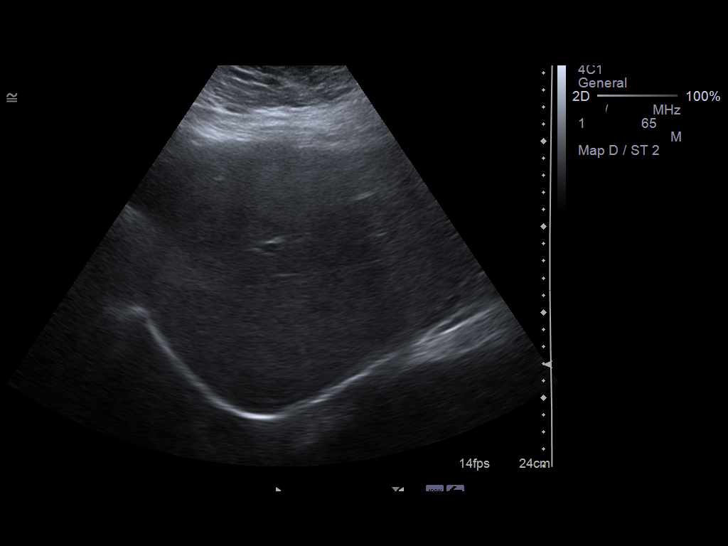
[im 31/67]
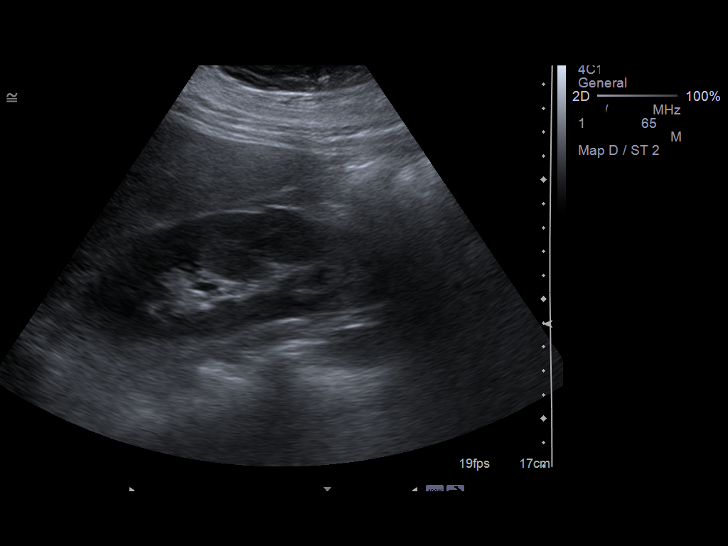
[im 36/67]
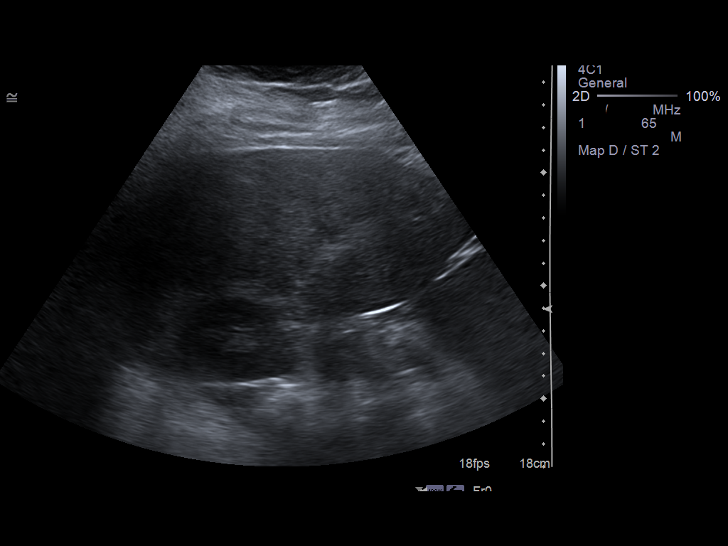
[im 42/67]
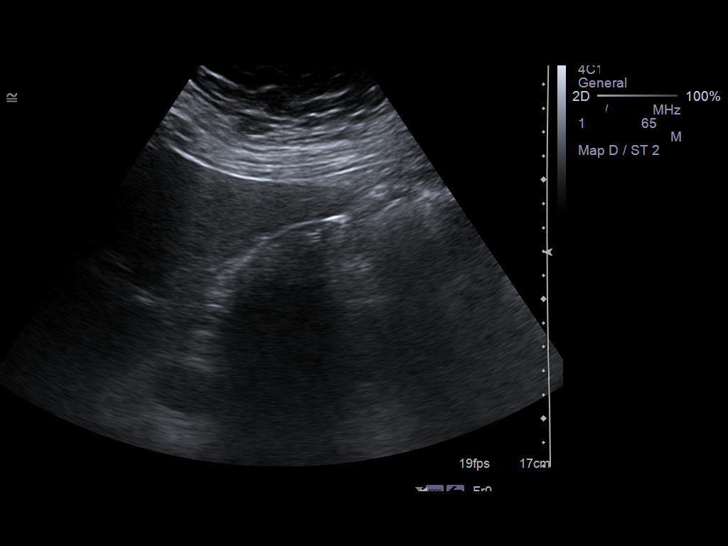
[im 45/67]
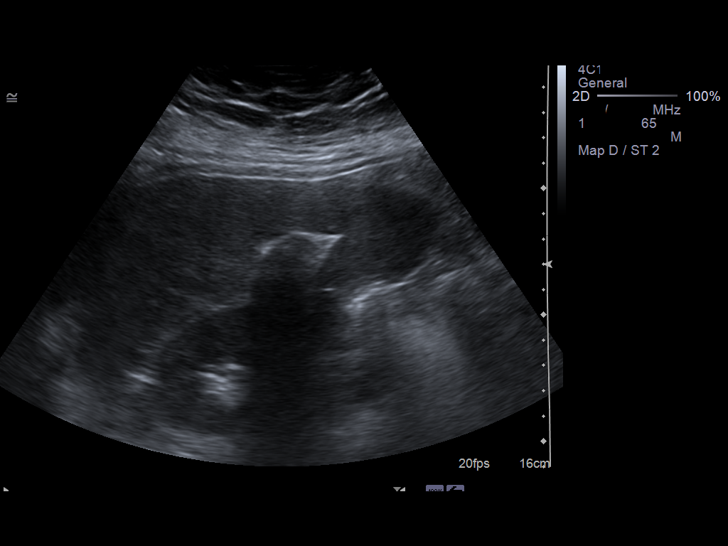
[im 50/67]
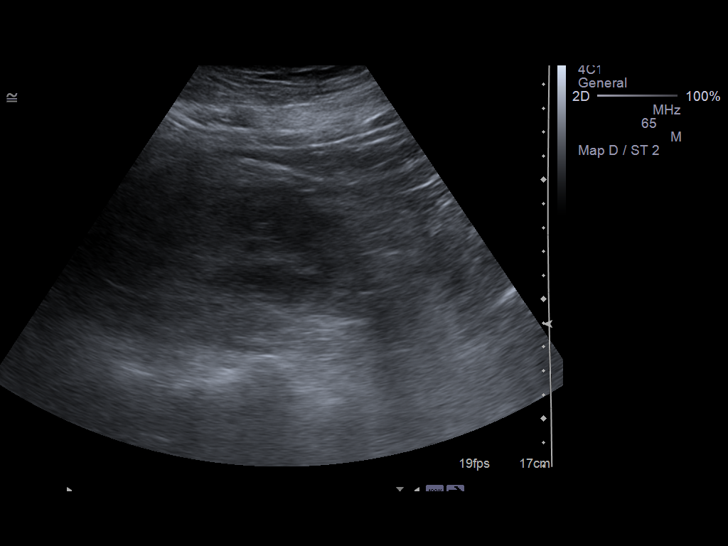
[im 56/67]
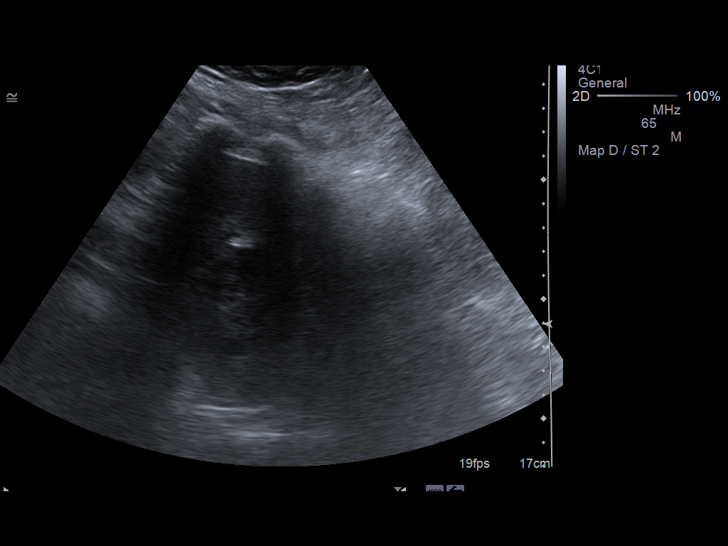
[im 61/67]
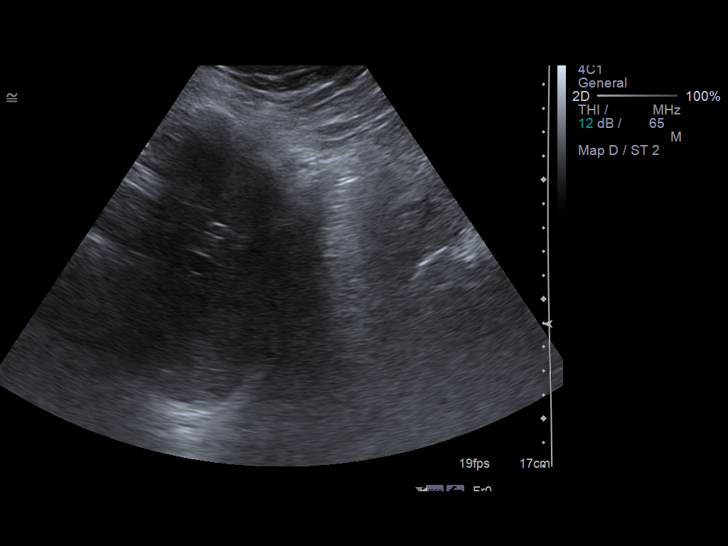
[im 67/67]
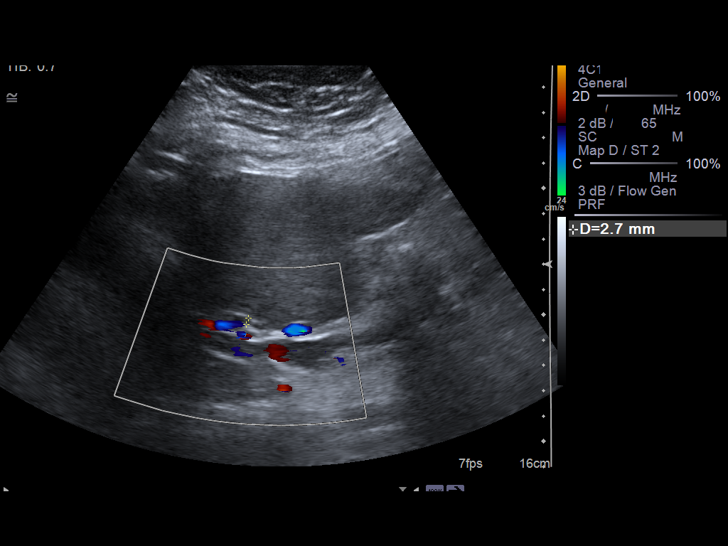

[14 of 25 positions shown; findings below may reference images not displayed]

Gallbladder:  Gallbladder filled with gallstones, with
identification of a wall echo shadow complex.  No definite
gallbladder wall thickening, pericholecystic fluid, or sonographic
Murphy's sign.

Common bile duct:  Normal caliber 4 mm diameter.

Liver:  Echogenic, likely fatty infiltration, though this can be
seen with cirrhosis and certain infiltrative disorders. No focal
hepatic mass or nodularity.

IVC:  Unremarkable

Pancreas:  Portion of tail obscured by bowel gas, remainder normal
appearance.

Spleen:  Normal appearance, 8 cm length.

Right kidney:  13.2 cm length.  Normal morphology without mass or
hydronephrosis.

Left kidney:  10.6 cm length.  Normal morphology without mass or
hydronephrosis.

Aorta:  Unremarkable

Other:  No free fluid
IMPRESSION: Cholelithiasis.
Probable mild fatty infiltration of liver.
Incomplete visualization of pancreatic tail.

## 2011-04-25 IMAGING — CR DG CHEST 2V
2 series · 2 of 2 positions shown · non-contrast
Comparison: 10/27/2009

CLINICAL DATA: Weakness, chest pain

CHEST - 2 VIEW

[w chest pa]
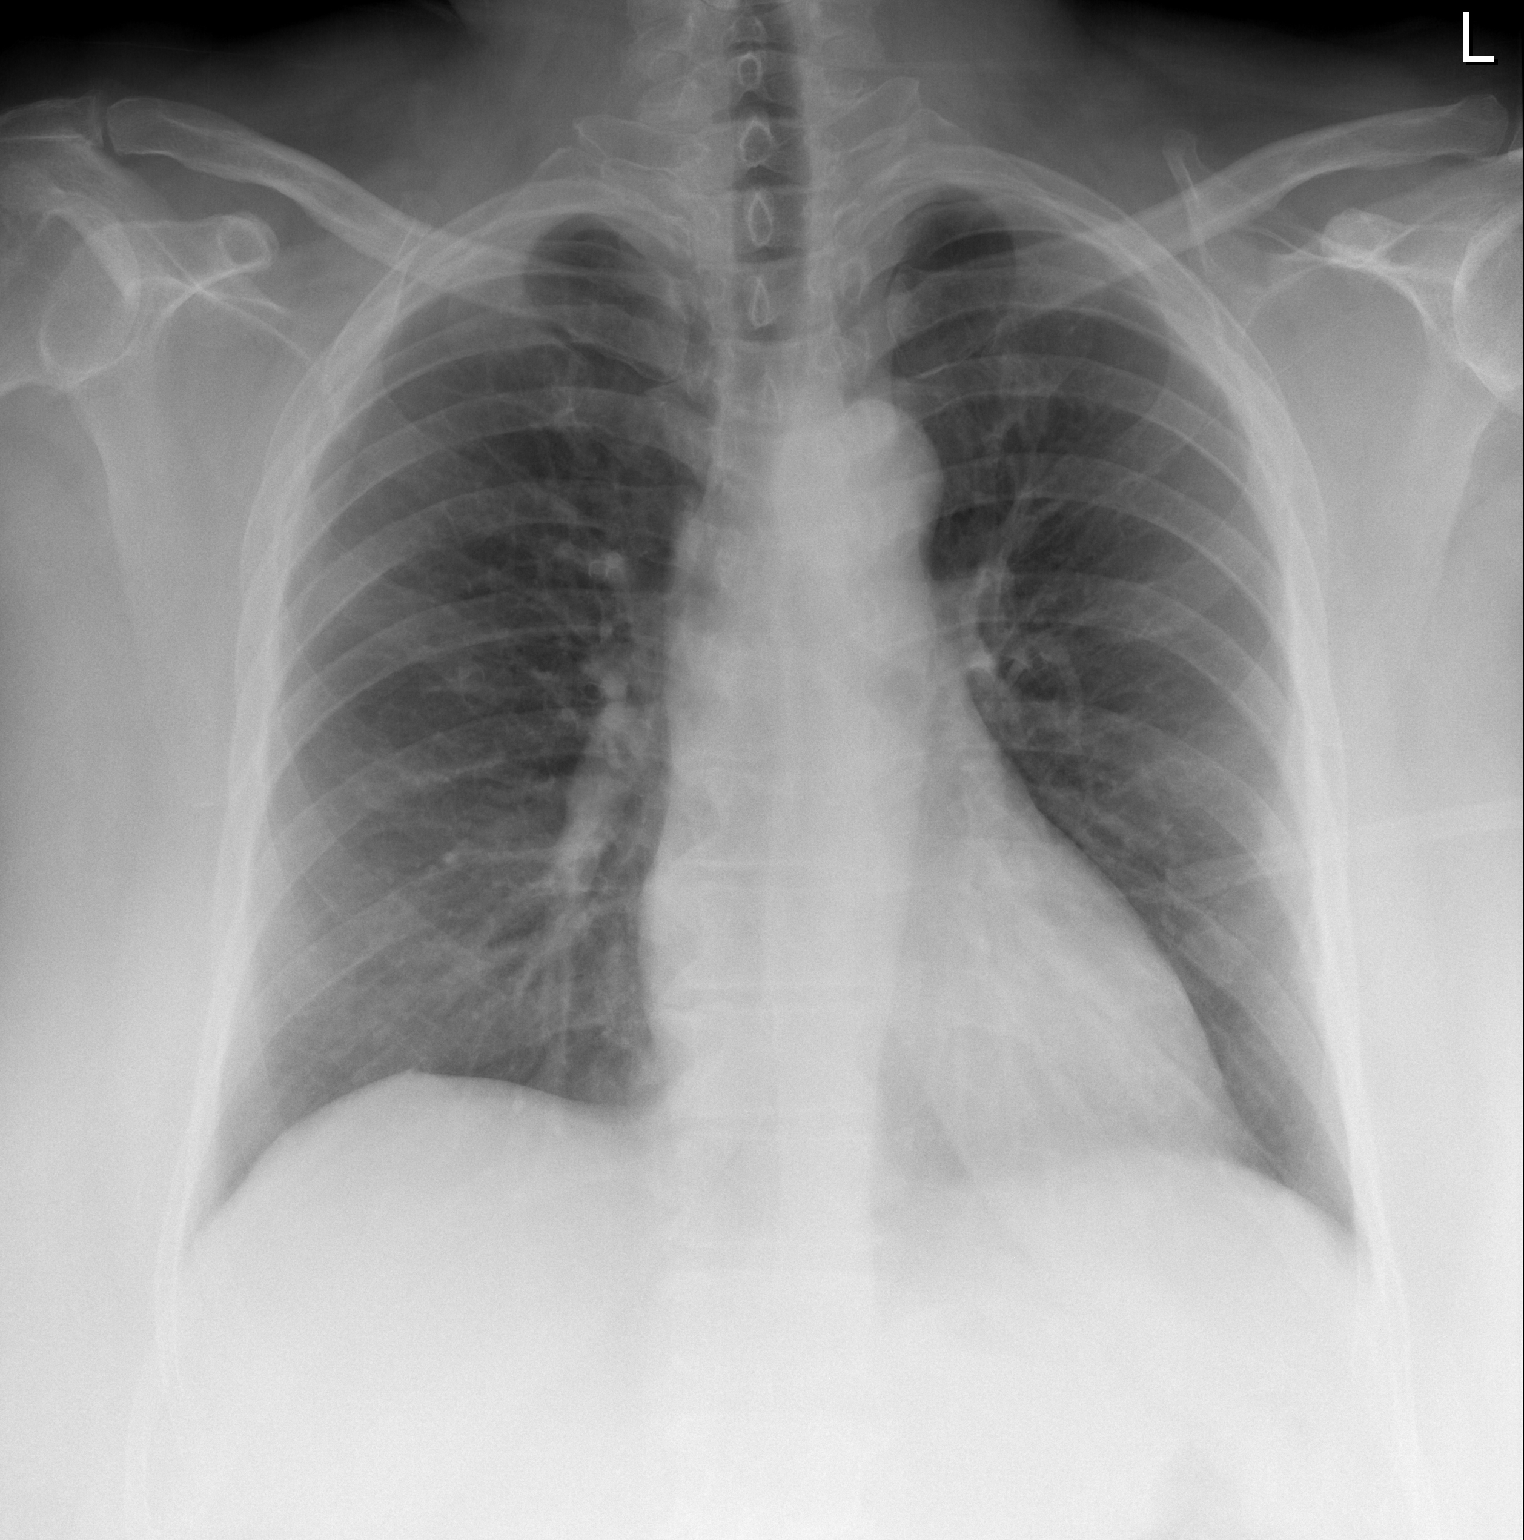

[w chest lat]
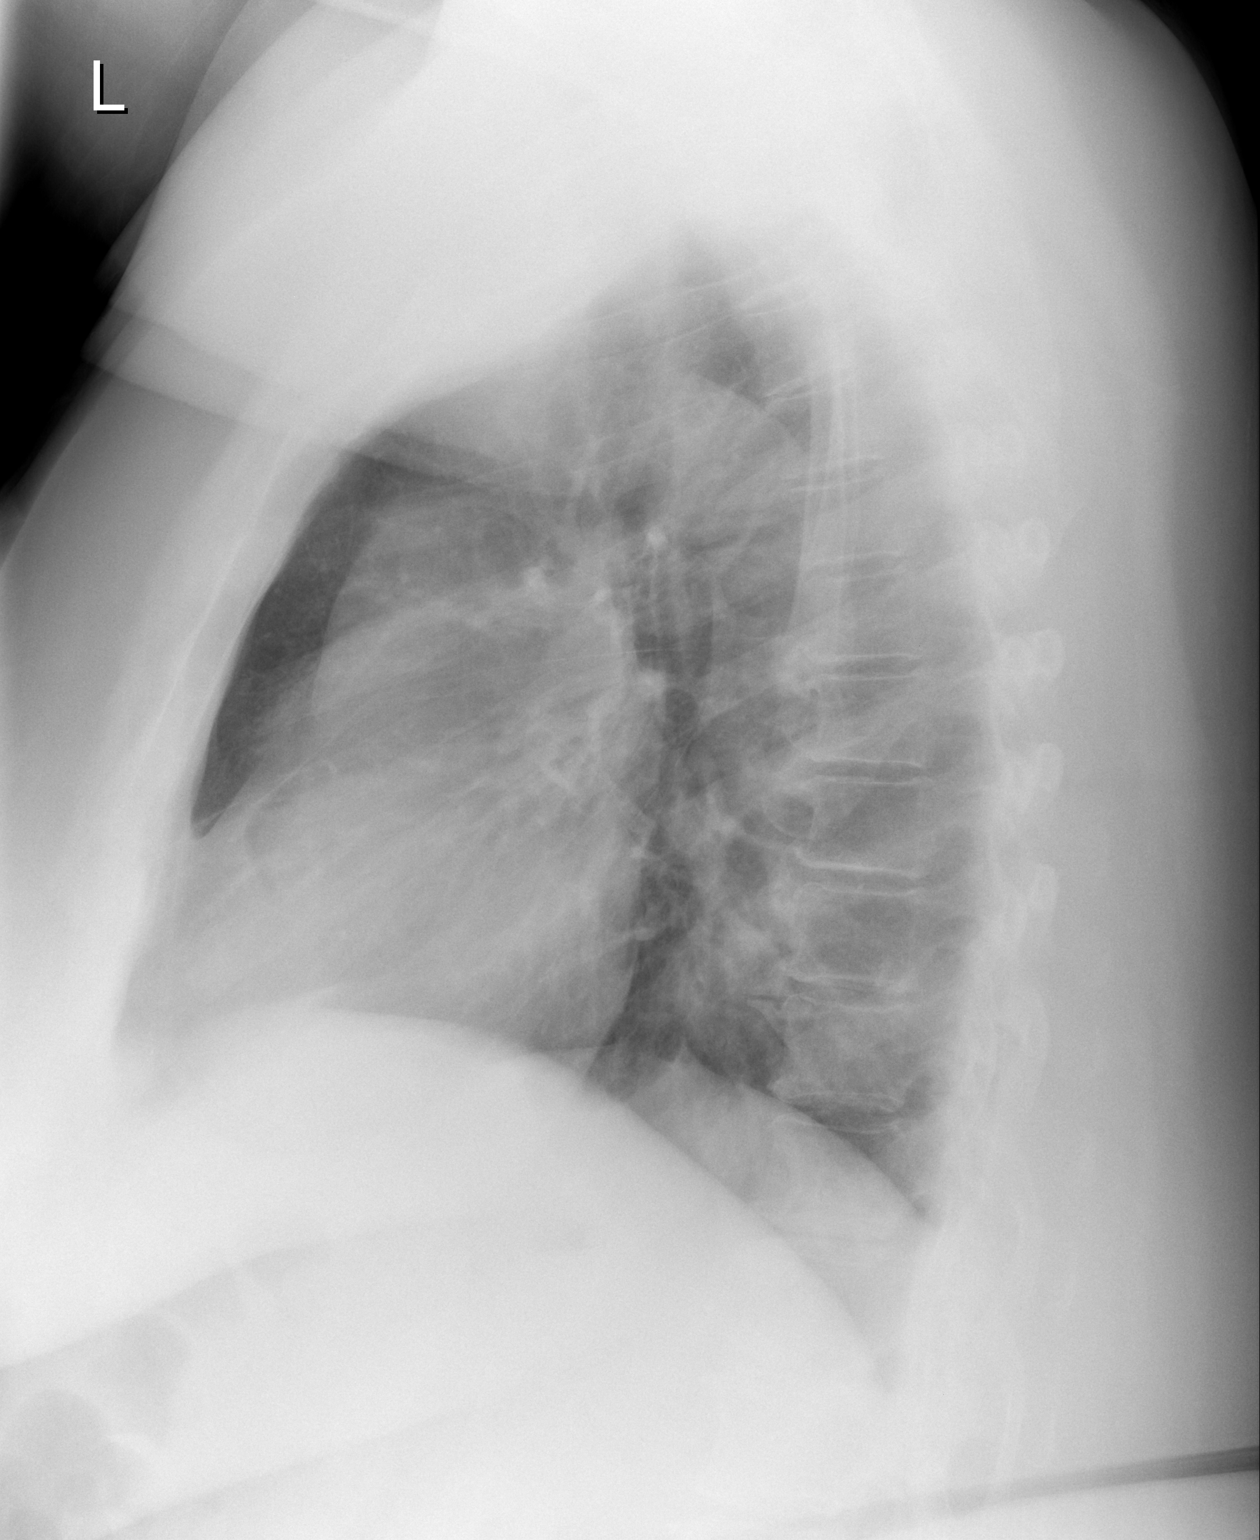

[2 of 2 positions shown; findings below may reference images not displayed]

FINDINGS: Normal heart size, mediastinal contours, and pulmonary vascularity.
Lungs clear.
Bones unremarkable.
No pneumothorax.
IMPRESSION: No acute abnormalities.

## 2011-04-25 NOTE — ED Notes (Signed)
Pt brought by Marketing executive. Seen here on the 3rd. However did not receive EKG and chest xray as requested by [facility] in order to start pt on new medication. Requests chest xray and ekg be done, results faxed to [facility], who will fax to [facility] for review. Pt has bed waiting at [facility] but needs med clearance completed.

## 2011-04-25 NOTE — ED Provider Notes (Signed)
History     CSN: [id number] Arrival date & time: 04/25/2011  1:55 PM   First MD Initiated Contact with Patient 04/25/11 1541      Chief Complaint  Patient presents with  . Medical Clearance     HPI 60yoF h/o schizoaffective d/o, psychosis presents for medical clearance. Pt from [place]. Awaiting placement. Was seen here 11/3. [facility] states that [place] requesting EKG and CXR in addition to previous labs for clearance. Pt denies complaints at this time including headache, dizziness, cp/sob/palpitations/n/v/fever/chills. No SI/HI today. States she's "been up fighting demons all night".   Christene Lye, RN 04/25/2011 14:46    Pt brought by Marketing executive. Seen here on the 3rd. However did not receive EKG and chest xray as requested by [facility] in order to start pt on new medication. Requests chest xray and ekg be done, results faxed to [facility], who will fax to [facility] for review. Pt has bed waiting at [facility] but needs med clearance completed.     Past Medical History  Diagnosis Date  . Schizoaffective disorder   . Urinary tract infection   . Hypertension   . Atrial fibrillation   . GERD (gastroesophageal reflux disease)   . Hypothyroidism   . Myocardial infarction   . Diabetes mellitus   . Obesity     History reviewed. No pertinent past surgical history.  No family history on file.  History  Substance Use Topics  . Smoking status: Current Everyday Smoker  . Smokeless tobacco: Not on file  . Alcohol Use: No    OB History    Grav Para Term Preterm Abortions TAB SAB Ect Mult Living                  Review of Systems  All other systems reviewed and are negative.   except as noted HPI  Allergies  Penicillins and Sulfamethoxazole w/trimethoprim  Home Medications   Current Outpatient Rx  Name Route Sig Dispense Refill  . AMLODIPINE BESYLATE 5 MG PO TABS Oral Take 5 mg by mouth daily.      Marland Kitchen BENZTROPINE MESYLATE 1 MG PO TABS Oral Take 1 mg  by mouth 2 (two) times daily.      Marland Kitchen CIPROFLOXACIN HCL 500 MG PO TABS Oral Take 500 mg by mouth 2 (two) times daily.     Marland Kitchen DABIGATRAN ETEXILATE MESYLATE 150 MG PO CAPS Oral Take 150 mg by mouth 2 (two) times daily.      Marland Kitchen DIVALPROEX SODIUM 500 MG PO TB24 Oral Take 1,000 mg by mouth at bedtime.      Marland Kitchen FOLIC ACID 1 MG PO TABS Oral Take 1 mg by mouth daily.      Marland Kitchen LEVOTHYROXINE SODIUM 112 MCG PO TABS Oral Take 112 mcg by mouth daily.      Marland Kitchen LISINOPRIL 40 MG PO TABS Oral Take 40 mg by mouth every 12 (twelve) hours.      Marland Kitchen NABUMETONE 500 MG PO TABS Oral Take 500 mg by mouth every 12 (twelve) hours as needed. For back pain.     Marland Kitchen OMEPRAZOLE 20 MG PO CPDR Oral Take 20 mg by mouth 2 (two) times daily.      Marland Kitchen PERPHENAZINE 2 MG PO TABS Oral Take 2-6 mg by mouth 2 (two) times daily. 2mg  in am, 6mg  qhs       BP 97/67  Pulse 98  Temp(Src) 98.3 F (36.8 C) (Oral)  Resp 20  SpO2 97%  Physical Exam  Nursing  note and vitals reviewed. Constitutional: She is oriented to person, place, and time. She appears well-developed.  HENT:  Head: Atraumatic.  Mouth/Throat: Oropharynx is clear and moist.  Eyes: Conjunctivae and EOM are normal. Pupils are equal, round, and reactive to light.  Neck: Normal range of motion. Neck supple.  Cardiovascular: Normal rate, regular rhythm, normal heart sounds and intact distal pulses.   Pulmonary/Chest: Effort normal and breath sounds normal. No respiratory distress. She has no wheezes. She has no rales.  Abdominal: Soft. She exhibits no distension. There is no tenderness. There is no rebound and no guarding.  Musculoskeletal: Normal range of motion.  Neurological: She is alert and oriented to person, place, and time.  Skin: Skin is warm and dry. No rash noted.  Psychiatric:       Laughing inappropriately    Date: 04/25/2011  Rate: 98  Rhythm: normal sinus rhythm  QRS Axis: normal  Intervals: normal  ST/T Wave abnormalities: nonspecific t wave changes  Conduction  Disutrbances:none  Narrative Interpretation:   Old EKG Reviewed: no acute changes   ED Course  Procedures (including critical care time)  Labs Reviewed - No data to display Dg Chest 2 View  04/25/2011  *RADIOLOGY REPORT*  Clinical Data: Medical clearance.  Leukocytosis.  CHEST - 2 VIEW  Comparison: Chest x-ray 03/10/2011.  Findings: The heart is upper limits of normal and stable.  The mediastinal and hilar contours are stable.  The lungs are clear. The bony thorax is intact peri  IMPRESSION: No acute cardiopulmonary findings.  Original Report Authenticated By: P. Loralie Champagne, M.D.     1. Psychosis     MDM  Here for medical clearance. EKG and CXR reviewed and unremarkable. To be faxed back over to [facility]. Plan for both to be faxed over and patient is to be discharged back to [facility] to await placement (possible placement at [facility])  Stefano Gaul, MD         Forbes Cellar, MD 04/25/11 2007

## 2011-04-25 NOTE — ED Notes (Signed)
I received a call from [place] at [place] requesting [place] to fax over labs for possible acceptance from here; I faxed over a EKG, Chest x-ray, labs per request

## 2011-05-16 ENCOUNTER — Encounter (HOSPITAL_COMMUNITY): Payer: Self-pay

## 2011-05-16 ENCOUNTER — Emergency Department (HOSPITAL_COMMUNITY)
Admission: EM | Admit: 2011-05-16 | Discharge: 2011-05-17 | Disposition: A | Discharge status: Other Acute Inpatient Hospital | Payer: Medicare Other | Source: Home / Self Care | Attending: Emergency Medicine | Admitting: Emergency Medicine

## 2011-05-16 DIAGNOSIS — I4891 Unspecified atrial fibrillation: Secondary | ICD-10-CM | POA: Insufficient documentation

## 2011-05-16 DIAGNOSIS — K219 Gastro-esophageal reflux disease without esophagitis: Secondary | ICD-10-CM | POA: Insufficient documentation

## 2011-05-16 DIAGNOSIS — I252 Old myocardial infarction: Secondary | ICD-10-CM | POA: Insufficient documentation

## 2011-05-16 DIAGNOSIS — E669 Obesity, unspecified: Secondary | ICD-10-CM | POA: Insufficient documentation

## 2011-05-16 DIAGNOSIS — Z79899 Other long term (current) drug therapy: Secondary | ICD-10-CM | POA: Insufficient documentation

## 2011-05-16 DIAGNOSIS — F17200 Nicotine dependence, unspecified, uncomplicated: Secondary | ICD-10-CM | POA: Insufficient documentation

## 2011-05-16 DIAGNOSIS — Z8659 Personal history of other mental and behavioral disorders: Secondary | ICD-10-CM | POA: Insufficient documentation

## 2011-05-16 DIAGNOSIS — E119 Type 2 diabetes mellitus without complications: Secondary | ICD-10-CM | POA: Insufficient documentation

## 2011-05-16 DIAGNOSIS — E039 Hypothyroidism, unspecified: Secondary | ICD-10-CM | POA: Insufficient documentation

## 2011-05-16 DIAGNOSIS — F29 Unspecified psychosis not due to a substance or known physiological condition: Secondary | ICD-10-CM | POA: Insufficient documentation

## 2011-05-16 DIAGNOSIS — I10 Essential (primary) hypertension: Secondary | ICD-10-CM | POA: Insufficient documentation

## 2011-05-16 LAB — CBC
Hemoglobin: 9.9 g/dL — ABNORMAL LOW (ref 12.0–15.0)
MCH: 32 pg (ref 26.0–34.0)
RBC: 3.09 MIL/uL — ABNORMAL LOW (ref 3.87–5.11)
WBC: 10.9 10*3/uL — ABNORMAL HIGH (ref 4.0–10.5)

## 2011-05-16 LAB — COMPREHENSIVE METABOLIC PANEL
ALT: 10 U/L (ref 0–35)
Alkaline Phosphatase: 47 U/L (ref 39–117)
CO2: 25 mEq/L (ref 19–32)
Chloride: 102 mEq/L (ref 96–112)
GFR calc Af Amer: 77 mL/min — ABNORMAL LOW (ref >90)
GFR calc non Af Amer: 66 mL/min — ABNORMAL LOW (ref >90)
Glucose, Bld: 93 mg/dL (ref 70–99)
Potassium: 2.9 mEq/L — ABNORMAL LOW (ref 3.5–5.1)
Sodium: 137 mEq/L (ref 135–145)
Total Bilirubin: 0.2 mg/dL — ABNORMAL LOW (ref 0.3–1.2)

## 2011-05-16 LAB — RAPID URINE DRUG SCREEN, HOSP PERFORMED
Barbiturates: NOT DETECTED
Tetrahydrocannabinol: NOT DETECTED

## 2011-05-16 MED ORDER — ZIPRASIDONE MESYLATE 20 MG IM SOLR
20.0000 mg | Freq: Once | INTRAMUSCULAR | Status: AC
  Administered 2011-05-16: 20 mg via INTRAMUSCULAR
  Filled 2011-05-16: qty 20

## 2011-05-16 NOTE — ED Notes (Signed)
WUJ:[id number]<JY> Expected date:05/16/11<BR> Expected time: 7:20 PM<BR> Means of arrival:Ambulance<BR> Comments:<BR> EMS 80 GC - psych pt

## 2011-05-16 NOTE — ED Notes (Signed)
Per EMS, arrived at pts home to find [employer] with pt. Pt is hallucinating. Pt thinks she is Jesus and she thinks demons are living in her apartment. Pt uses the bathroom on her floor per [employer] and EMS. Pt with no complaints. Pt with hx of schizoaffective disorder. Pt currently blowing kisses and laughing at herself.

## 2011-05-16 NOTE — ED Notes (Signed)
Pt with hx of schizoeffective disorder from home  accompanied by [employer] with c/o hallucinations and homicidal ideations. Pt states, "you are the demon that killed my children....someone is trying to rape me" Pt is talking to herself and with attention fixed to various focal points on the walls in the room. Pt has loud outbursts of laughter with random thought process. Pt is alert to person and place. Per [employer], the floors in pts apartment were soiled with urine and feces. [employer] states pt was threatening to kill everyone in her neighborhood.

## 2011-05-16 NOTE — ED Notes (Signed)
Pt laying in bed, yelling and screaming. Speech is incoherent. Attempted to reorient pt without success. Lights dimmed to decrease stimuli. Door remains open, will continue to monitor.

## 2011-05-17 ENCOUNTER — Encounter (HOSPITAL_COMMUNITY): Payer: Self-pay

## 2011-05-17 ENCOUNTER — Other Ambulatory Visit: Payer: Self-pay

## 2011-05-17 ENCOUNTER — Emergency Department (HOSPITAL_COMMUNITY): Payer: Medicare Other

## 2011-05-17 ENCOUNTER — Inpatient Hospital Stay (HOSPITAL_COMMUNITY)
Admission: AD | Admit: 2011-05-17 | Discharge: 2011-06-03 | DRG: 885 | Disposition: A | Discharge status: Home / Self Care | Payer: Medicare Other | Source: Ambulatory Visit | Attending: Psychiatry | Admitting: Psychiatry

## 2011-05-17 ENCOUNTER — Telehealth (HOSPITAL_COMMUNITY): Payer: Self-pay | Admitting: Licensed Clinical Social Worker

## 2011-05-17 DIAGNOSIS — I252 Old myocardial infarction: Secondary | ICD-10-CM

## 2011-05-17 DIAGNOSIS — F259 Schizoaffective disorder, unspecified: Secondary | ICD-10-CM

## 2011-05-17 DIAGNOSIS — F250 Schizoaffective disorder, bipolar type: Secondary | ICD-10-CM

## 2011-05-17 DIAGNOSIS — E669 Obesity, unspecified: Secondary | ICD-10-CM

## 2011-05-17 DIAGNOSIS — F319 Bipolar disorder, unspecified: Secondary | ICD-10-CM

## 2011-05-17 DIAGNOSIS — Z23 Encounter for immunization: Secondary | ICD-10-CM

## 2011-05-17 DIAGNOSIS — E119 Type 2 diabetes mellitus without complications: Secondary | ICD-10-CM

## 2011-05-17 DIAGNOSIS — M1990 Unspecified osteoarthritis, unspecified site: Secondary | ICD-10-CM

## 2011-05-17 DIAGNOSIS — E876 Hypokalemia: Secondary | ICD-10-CM

## 2011-05-17 DIAGNOSIS — I10 Essential (primary) hypertension: Secondary | ICD-10-CM

## 2011-05-17 DIAGNOSIS — D509 Iron deficiency anemia, unspecified: Secondary | ICD-10-CM

## 2011-05-17 DIAGNOSIS — I4891 Unspecified atrial fibrillation: Secondary | ICD-10-CM

## 2011-05-17 DIAGNOSIS — J45909 Unspecified asthma, uncomplicated: Secondary | ICD-10-CM

## 2011-05-17 DIAGNOSIS — K219 Gastro-esophageal reflux disease without esophagitis: Secondary | ICD-10-CM

## 2011-05-17 DIAGNOSIS — Z87440 Personal history of urinary (tract) infections: Secondary | ICD-10-CM

## 2011-05-17 DIAGNOSIS — Z79899 Other long term (current) drug therapy: Secondary | ICD-10-CM

## 2011-05-17 DIAGNOSIS — IMO0002 Reserved for concepts with insufficient information to code with codable children: Secondary | ICD-10-CM

## 2011-05-17 DIAGNOSIS — Z7982 Long term (current) use of aspirin: Secondary | ICD-10-CM

## 2011-05-17 DIAGNOSIS — Z882 Allergy status to sulfonamides status: Secondary | ICD-10-CM

## 2011-05-17 DIAGNOSIS — R6250 Unspecified lack of expected normal physiological development in childhood: Secondary | ICD-10-CM

## 2011-05-17 DIAGNOSIS — E039 Hypothyroidism, unspecified: Secondary | ICD-10-CM

## 2011-05-17 DIAGNOSIS — Z880 Allergy status to penicillin: Secondary | ICD-10-CM

## 2011-05-17 DIAGNOSIS — R45851 Suicidal ideations: Secondary | ICD-10-CM

## 2011-05-17 LAB — URINALYSIS, ROUTINE W REFLEX MICROSCOPIC
Nitrite: NEGATIVE
Protein, ur: 30 mg/dL — AB
Specific Gravity, Urine: 1.018 (ref 1.005–1.030)
Urobilinogen, UA: 1 mg/dL (ref 0.0–1.0)

## 2011-05-17 LAB — URINE MICROSCOPIC-ADD ON

## 2011-05-17 LAB — GLUCOSE, CAPILLARY
Glucose-Capillary: 94 mg/dL (ref 70–99)
Glucose-Capillary: 107 mg/dL — ABNORMAL HIGH (ref 70–99)

## 2011-05-17 MED ORDER — MAGNESIUM HYDROXIDE 400 MG/5ML PO SUSP: 30.0000 mL | Freq: Every day | ORAL | Status: DC | PRN

## 2011-05-17 MED ORDER — DIVALPROEX SODIUM ER 500 MG PO TB24
1000.0000 mg | ORAL_TABLET | Freq: Every day | ORAL | Status: DC
  Administered 2011-05-18: 1000 mg via ORAL
  Filled 2011-05-17 (×2): qty 2

## 2011-05-17 MED ORDER — PALIPERIDONE ER 6 MG PO TB24
6.0000 mg | ORAL_TABLET | Freq: Every day | ORAL | Status: DC
  Administered 2011-05-19 – 2011-05-22 (×4): 6 mg via ORAL
  Filled 2011-05-17 (×2): qty 1
  Filled 2011-05-17: qty 2
  Filled 2011-05-17 (×4): qty 1

## 2011-05-17 MED ORDER — DIVALPROEX SODIUM ER 500 MG PO TB24
1000.0000 mg | ORAL_TABLET | Freq: Every day | ORAL | Status: DC
  Administered 2011-05-17: 1000 mg via ORAL
  Filled 2011-05-17 (×2): qty 2

## 2011-05-17 MED ORDER — POTASSIUM CHLORIDE 20 MEQ/15ML (10%) PO LIQD
20.0000 meq | Freq: Every day | ORAL | Status: DC
  Administered 2011-05-17: 20 meq via ORAL
  Filled 2011-05-17 (×3): qty 15

## 2011-05-17 MED ORDER — HALOPERIDOL LACTATE 5 MG/ML IJ SOLN
5.0000 mg | Freq: Once | INTRAMUSCULAR | Status: AC
  Administered 2011-05-17: 5 mg via INTRAMUSCULAR

## 2011-05-17 MED ORDER — LORAZEPAM 1 MG PO TABS
1.0000 mg | ORAL_TABLET | Freq: Once | ORAL | Status: AC
  Administered 2011-05-17: 1 mg via ORAL
  Filled 2011-05-17: qty 1

## 2011-05-17 MED ORDER — POTASSIUM CHLORIDE 20 MEQ/15ML (10%) PO LIQD
40.0000 meq | Freq: Once | ORAL | Status: AC
  Administered 2011-05-17: 40 meq via ORAL
  Filled 2011-05-17: qty 30

## 2011-05-17 MED ORDER — ALUM & MAG HYDROXIDE-SIMETH 200-200-20 MG/5ML PO SUSP: 30.0000 mL | ORAL | Status: DC | PRN

## 2011-05-17 MED ORDER — PALIPERIDONE ER 6 MG PO TB24
6.0000 mg | ORAL_TABLET | Freq: Every day | ORAL | Status: DC
  Administered 2011-05-17: 6 mg via ORAL
  Filled 2011-05-17 (×3): qty 1

## 2011-05-17 MED ORDER — HALOPERIDOL LACTATE 5 MG/ML IJ SOLN
INTRAMUSCULAR | Status: AC
  Administered 2011-05-17: 5 mg via INTRAMUSCULAR
  Filled 2011-05-17: qty 1

## 2011-05-17 MED ORDER — ACETAMINOPHEN 325 MG PO TABS: 650.0000 mg | ORAL_TABLET | Freq: Four times a day (QID) | ORAL | Status: DC | PRN

## 2011-05-17 NOTE — ED Notes (Signed)
Pt yelling, cursing, spitting, and screaming. Attempted to deescalate patient without success. Mask applied to pts face because of spitting. MD aware.

## 2011-05-17 NOTE — ED Provider Notes (Signed)
Pt is obviously having auditory and visual hallucinations. She is talking to someone now there. She has been uncooperative with nursing staff.   IVC papers signed by me tonight.   Devoria Albe, MD, Armando Gang   Ward Givens, MD 05/17/11 2220

## 2011-05-17 NOTE — ED Notes (Signed)
Pt still talking loud. No distress noted. ABC's intact. Will continue to monitor.

## 2011-05-17 NOTE — ED Notes (Signed)
Patient informed she could not talk to staff the way she has been talking to the tech, patient just laughed and began babbling

## 2011-05-17 NOTE — ED Provider Notes (Signed)
History     CSN: [id number] Arrival date & time: 05/16/2011  7:39 PM   First MD Initiated Contact with Patient 05/16/11 2019      Chief Complaint  Patient presents with  . Psychiatric Evaluation  . Hallucinations    (Consider location/radiation/quality/duration/timing/severity/associated sxs/prior treatment) The history is provided by the EMS personnel.   patient is a 60 year old female followed by Dr. Delrae Alfred, brought in by EMS along with [facility] police for hallucinating and threatening to kill people in her neighborhood stating that she thinks she is Jesus and she thinks demons are living in her apartment.   Patient is a level V caveat in this cannot give appropriate history from her.   EMS noted that the apartment was disheveled and there was feces all over the floor.  Past Medical History  Diagnosis Date  . Schizoaffective disorder   . Urinary tract infection   . Hypertension   . Atrial fibrillation   . GERD (gastroesophageal reflux disease)   . Hypothyroidism   . Myocardial infarction   . Diabetes mellitus   . Obesity     History reviewed. No pertinent past surgical history.  No family history on file.  History  Substance Use Topics  . Smoking status: Current Everyday Smoker  . Smokeless tobacco: Not on file  . Alcohol Use: No    OB History    Grav Para Term Preterm Abortions TAB SAB Ect Mult Living                  Review of Systems  Unable to perform ROS  level V caveat most likely altered mental status due to psychosis unable to obtain review of systems.  Allergies  Penicillins and Sulfamethoxazole w/trimethoprim  Home Medications   Current Outpatient Rx  Name Route Sig Dispense Refill  . AMLODIPINE BESYLATE 5 MG PO TABS Oral Take 5 mg by mouth daily.      Marland Kitchen BENZTROPINE MESYLATE 1 MG PO TABS Oral Take 1 mg by mouth 2 (two) times daily.      Marland Kitchen CIPROFLOXACIN HCL 500 MG PO TABS Oral Take 500 mg by mouth 2 (two) times daily.     Marland Kitchen DABIGATRAN  ETEXILATE MESYLATE 150 MG PO CAPS Oral Take 150 mg by mouth 2 (two) times daily.      Marland Kitchen DIVALPROEX SODIUM 500 MG PO TB24 Oral Take 1,000 mg by mouth at bedtime.      Marland Kitchen FOLIC ACID 1 MG PO TABS Oral Take 1 mg by mouth daily.      Marland Kitchen LEVOTHYROXINE SODIUM 112 MCG PO TABS Oral Take 112 mcg by mouth daily.      Marland Kitchen LISINOPRIL 40 MG PO TABS Oral Take 40 mg by mouth every 12 (twelve) hours.      Marland Kitchen NABUMETONE 500 MG PO TABS Oral Take 500 mg by mouth every 12 (twelve) hours as needed. For back pain.     Marland Kitchen OMEPRAZOLE 20 MG PO CPDR Oral Take 20 mg by mouth 2 (two) times daily.      Marland Kitchen PERPHENAZINE 2 MG PO TABS Oral Take 2-6 mg by mouth 2 (two) times daily. 2mg  in am, 6mg  qhs       BP 153/86  Pulse 93  Temp(Src) 98.4 F (36.9 C) (Oral)  Resp 20  SpO2 99%  Physical Exam  Nursing note and vitals reviewed. Constitutional: She appears well-developed and well-nourished.  HENT:  Head: Normocephalic and atraumatic.  Eyes: Conjunctivae and EOM are normal. Pupils are equal,  round, and reactive to light.  Neck: Normal range of motion. Neck supple.  Cardiovascular: Normal rate and regular rhythm.   No murmur heard. Pulmonary/Chest: Effort normal and breath sounds normal.  Abdominal: Soft. Bowel sounds are normal. There is no tenderness.  Musculoskeletal: Normal range of motion. She exhibits no tenderness.  Neurological: No cranial nerve deficit. She exhibits normal muscle tone. Coordination normal.       Moving all 4 extremities see psychiatric for a physical exam for behavioral health abnormalities.  Skin: Skin is warm. No rash noted. She is not diaphoretic.  Psychiatric:       Abnormal thought content, patient seems to be hallucinating his delusional. Resting homicidal ideation. Patient was brought in on a hold by police department.    ED Course  Procedures (including critical care time)  Labs Reviewed  CBC - Abnormal; Notable for the following:    WBC 10.9 (*)    RBC 3.09 (*)    Hemoglobin 9.9 (*)     HCT 30.1 (*)    All other components within normal limits  COMPREHENSIVE METABOLIC PANEL - Abnormal; Notable for the following:    Potassium 2.9 (*)    Albumin 3.2 (*)    Total Bilirubin 0.2 (*)    GFR calc non Af Amer 66 (*)    GFR calc Af Amer 77 (*)    All other components within normal limits  ETHANOL  URINE RAPID DRUG SCREEN (HOSP PERFORMED)   Results for orders placed during the hospital encounter of 05/16/11  CBC      Component Value Range   WBC 10.9 (*) 4.0 - 10.5 (K/uL)   RBC 3.09 (*) 3.87 - 5.11 (MIL/uL)   Hemoglobin 9.9 (*) 12.0 - 15.0 (g/dL)   HCT 62.1 (*) 30.8 - 46.0 (%)   MCV 97.4  78.0 - 100.0 (fL)   MCH 32.0  26.0 - 34.0 (pg)   MCHC 32.9  30.0 - 36.0 (g/dL)   RDW 65.7  84.6 - 96.2 (%)   Platelets 233  150 - 400 (K/uL)  COMPREHENSIVE METABOLIC PANEL      Component Value Range   Sodium 137  135 - 145 (mEq/L)   Potassium 2.9 (*) 3.5 - 5.1 (mEq/L)   Chloride 102  96 - 112 (mEq/L)   CO2 25  19 - 32 (mEq/L)   Glucose, Bld 93  70 - 99 (mg/dL)   BUN 8  6 - 23 (mg/dL)   Creatinine, Ser 9.52  0.50 - 1.10 (mg/dL)   Calcium 9.2  8.4 - 84.1 (mg/dL)   Total Protein 6.6  6.0 - 8.3 (g/dL)   Albumin 3.2 (*) 3.5 - 5.2 (g/dL)   AST 12  0 - 37 (U/L)   ALT 10  0 - 35 (U/L)   Alkaline Phosphatase 47  39 - 117 (U/L)   Total Bilirubin 0.2 (*) 0.3 - 1.2 (mg/dL)   GFR calc non Af Amer 66 (*) >90 (mL/min)   GFR calc Af Amer 77 (*) >90 (mL/min)  ETHANOL      Component Value Range   Alcohol, Ethyl (B) <11  0 - 11 (mg/dL)  URINE RAPID DRUG SCREEN (HOSP PERFORMED)      Component Value Range   Opiates NONE DETECTED  NONE DETECTED    Cocaine NONE DETECTED  NONE DETECTED    Benzodiazepines NONE DETECTED  NONE DETECTED    Amphetamines NONE DETECTED  NONE DETECTED    Tetrahydrocannabinol NONE DETECTED  NONE DETECTED  Barbiturates NONE DETECTED  NONE DETECTED      1. Psychosis       MDM   The patient responded to 20 mg of IM Geodon due to no history specifically a  psychosis other than the history of schizoaffective disorder, will get a chest x-ray and head CT, behavioral health team already aware of the patient and have him on the list for consultation. Patient initial labs have her clinically cleared for psych admission with the exception of a low potassium at 2.9. We'll provide potassium supplementation by mouth.        Shelda Jakes, MD 05/17/11 [phone]

## 2011-05-17 NOTE — ED Notes (Signed)
Patient transported to CT 

## 2011-05-17 NOTE — ED Notes (Signed)
Pt has nasal cannula
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
## 2011-05-17 NOTE — ED Notes (Signed)
Pt showered with prompting and supervision from this RN due to horrible body odor. Hair washed and tangles combed out. Deoderant and baby powder applied. Pt cries at times about "Burna Mortimer" but follows commands. Meds given and pt transferred to [facility] with [employer] as IVC.

## 2011-05-17 NOTE — ED Notes (Signed)
Pt resting quietly at this time. NAD. VSS. ABC's intact. Will continue to monitor.

## 2011-05-17 NOTE — Consult Note (Signed)
Patient Identification:  [name] Date of Evaluation:  05/17/2011   History of Present Illness:  60 year old Caucasian female with history off for schizoaffective disorder who is currently floridly psychotic. Patient isn't responding to inner stimuli. Patient is unable to make a logical conversation. Patient believes she is Jesus and  there are demons living in her home. I also reviewed patient last psych discharge summary patient was on an in regard 6 mg at bedtime and one dose now.  Patient potassium is 2.9.  20 mEq of KCl given.  CT scan is within normal limits. Urine Analysis is not done so I will order that.  Past Medical History:     Past Medical History  Diagnosis Date  . Schizoaffective disorder   . Urinary tract infection   . Hypertension   . Atrial fibrillation   . GERD (gastroesophageal reflux disease)   . Hypothyroidism   . Myocardial infarction   . Diabetes mellitus   . Obesity       History reviewed. No pertinent past surgical history.  Allergies:  Allergies  Allergen Reactions  . Penicillins     REACTION: rash  . Sulfamethoxazole W/Trimethoprim     REACTION: ? N/V    Current Medications:  Prior to Admission medications   Medication Sig Start Date End Date Taking? Authorizing Provider  amLODipine (NORVASC) 5 MG tablet Take 5 mg by mouth daily.      Historical Provider, MD  benztropine (COGENTIN) 1 MG tablet Take 1 mg by mouth 2 (two) times daily.      Historical Provider, MD  ciprofloxacin (CIPRO) 500 MG tablet Take 500 mg by mouth 2 (two) times daily.  04/24/11   Historical Provider, MD  dabigatran (PRADAXA) 150 MG CAPS Take 150 mg by mouth 2 (two) times daily.      Historical Provider, MD  divalproex (DEPAKOTE ER) 500 MG 24 hr tablet Take 1,000 mg by mouth at bedtime.      Historical Provider, MD  folic acid (FOLVITE) 1 MG tablet Take 1 mg by mouth daily.      Historical Provider, MD  levothyroxine (SYNTHROID, LEVOTHROID) 112 MCG tablet Take 112  mcg by mouth daily.      Historical Provider, MD  lisinopril (PRINIVIL,ZESTRIL) 40 MG tablet Take 40 mg by mouth every 12 (twelve) hours.      Historical Provider, MD  nabumetone (RELAFEN) 500 MG tablet Take 500 mg by mouth every 12 (twelve) hours as needed. For back pain.     Historical Provider, MD  omeprazole (PRILOSEC) 20 MG capsule Take 20 mg by mouth 2 (two) times daily.      Historical Provider, MD  perphenazine (TRILAFON) 2 MG tablet Take 2-6 mg by mouth 2 (two) times daily. 2mg  in am, 6mg  qhs     Historical Provider, MD    Social History:    reports that she has been smoking.  She does not have any smokeless tobacco history on file. She reports that she does not drink alcohol or use illicit drugs.   Diagnoses- schizoaffective disorder.   Recommendations: I will start her on invega 6 mg at bedtime and depakote 1000mg   and  admit the patient at behavioral health for further stabilization.    Eulogio Ditch, MD

## 2011-05-17 NOTE — ED Notes (Signed)
On approach by this RN, pt was laughing and talking to herself, answers questions inappropriately. Allowed this nurse and tech to place EKG stickers for leads and perform EKG. She stated that she would allow this because Redge Gainer was a good doctor and he had already told her that we needed the results from this test. Pt stated that there were snakes in her hair, and that she had had them there since she was 8. She also stated that she thought this RN was her daughter. A few minutes later, the pharmacy tech entered her room and she was unable to answer his questions about home meds, so she called him a demon and told him to get out of her room. Pt is able to deny pain, and she will tell staff that she cannot walk or take care of herself, but she is able to walk and perform ADL's. Staff has also insisted that she clean up her mess when she drops things, and she has been able to do that also. Requires some prompting and some redirection. Remains stable and safe on unit.
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
## 2011-05-18 ENCOUNTER — Encounter (HOSPITAL_COMMUNITY): Payer: Self-pay

## 2011-05-18 DIAGNOSIS — F29 Unspecified psychosis not due to a substance or known physiological condition: Secondary | ICD-10-CM

## 2011-05-18 LAB — COMPREHENSIVE METABOLIC PANEL
CO2: 26 mEq/L (ref 19–32)
Calcium: 8.7 mg/dL (ref 8.4–10.5)
Creatinine, Ser: 0.9 mg/dL (ref 0.50–1.10)
GFR calc Af Amer: 80 mL/min — ABNORMAL LOW (ref >90)
GFR calc non Af Amer: 69 mL/min — ABNORMAL LOW (ref >90)
Glucose, Bld: 117 mg/dL — ABNORMAL HIGH (ref 70–99)

## 2011-05-18 LAB — CBC
Hemoglobin: 9.4 g/dL — ABNORMAL LOW (ref 12.0–15.0)
MCHC: 32.4 g/dL (ref 30.0–36.0)
Platelets: 196 10*3/uL (ref 150–400)
RBC: 2.95 MIL/uL — ABNORMAL LOW (ref 3.87–5.11)

## 2011-05-18 MED ORDER — DABIGATRAN ETEXILATE MESYLATE 150 MG PO CAPS
150.0000 mg | ORAL_CAPSULE | ORAL | Status: DC
  Administered 2011-05-18 – 2011-05-21 (×5): 150 mg via ORAL
  Filled 2011-05-18 (×10): qty 1

## 2011-05-18 MED ORDER — LORAZEPAM 1 MG PO TABS
1.0000 mg | ORAL_TABLET | Freq: Four times a day (QID) | ORAL | Status: DC | PRN
  Administered 2011-05-28 – 2011-05-30 (×4): 1 mg via ORAL
  Filled 2011-05-18 (×4): qty 1

## 2011-05-18 MED ORDER — AMLODIPINE BESYLATE 5 MG PO TABS
5.0000 mg | ORAL_TABLET | Freq: Every day | ORAL | Status: DC
  Administered 2011-05-18 – 2011-06-03 (×17): 5 mg via ORAL
  Filled 2011-05-18 (×15): qty 1
  Filled 2011-05-18: qty 7
  Filled 2011-05-18 (×3): qty 1

## 2011-05-18 MED ORDER — POTASSIUM CHLORIDE CRYS ER 20 MEQ PO TBCR
20.0000 meq | EXTENDED_RELEASE_TABLET | Freq: Two times a day (BID) | ORAL | Status: AC
  Administered 2011-05-18: 20 meq via ORAL
  Filled 2011-05-18: qty 2

## 2011-05-18 MED ORDER — POTASSIUM CHLORIDE 20 MEQ PO PACK: 20.0000 meq | PACK | Freq: Two times a day (BID) | ORAL | Status: DC

## 2011-05-18 MED ORDER — LORAZEPAM 1 MG PO TABS: 2.0000 mg | ORAL_TABLET | Freq: Once | ORAL | Status: DC

## 2011-05-18 MED ORDER — DIVALPROEX SODIUM ER 500 MG PO TB24
1000.0000 mg | ORAL_TABLET | Freq: Every day | ORAL | Status: DC
  Administered 2011-05-20 – 2011-05-23 (×4): 1000 mg via ORAL
  Filled 2011-05-18 (×7): qty 2

## 2011-05-18 MED ORDER — LEVOTHYROXINE SODIUM 112 MCG PO TABS
112.0000 ug | ORAL_TABLET | ORAL | Status: DC
  Administered 2011-05-19: 112 ug via ORAL
  Filled 2011-05-18 (×2): qty 1

## 2011-05-18 MED ORDER — POTASSIUM CHLORIDE CRYS ER 20 MEQ PO TBCR
20.0000 meq | EXTENDED_RELEASE_TABLET | Freq: Once | ORAL | Status: AC
  Administered 2011-05-18: 20 meq via ORAL

## 2011-05-18 MED ORDER — FOLIC ACID 1 MG PO TABS
1.0000 mg | ORAL_TABLET | Freq: Every day | ORAL | Status: DC
  Administered 2011-05-19 – 2011-06-03 (×16): 1 mg via ORAL
  Filled 2011-05-18 (×10): qty 1
  Filled 2011-05-18: qty 7
  Filled 2011-05-18 (×7): qty 1

## 2011-05-18 MED ORDER — METOPROLOL TARTRATE 50 MG PO TABS
50.0000 mg | ORAL_TABLET | Freq: Two times a day (BID) | ORAL | Status: DC
  Administered 2011-05-18 – 2011-06-03 (×32): 50 mg via ORAL
  Filled 2011-05-18 (×19): qty 1
  Filled 2011-05-18: qty 14
  Filled 2011-05-18 (×4): qty 1
  Filled 2011-05-18: qty 14
  Filled 2011-05-18 (×11): qty 1

## 2011-05-18 MED ORDER — PANTOPRAZOLE SODIUM 40 MG PO TBEC
40.0000 mg | DELAYED_RELEASE_TABLET | Freq: Every day | ORAL | Status: DC
  Administered 2011-05-19 – 2011-06-03 (×17): 40 mg via ORAL
  Filled 2011-05-18 (×19): qty 1

## 2011-05-18 MED ORDER — LORAZEPAM 1 MG PO TABS
ORAL_TABLET | ORAL | Status: AC
  Administered 2011-05-18: 2 mg via GASTROSTOMY
  Filled 2011-05-18: qty 2

## 2011-05-18 NOTE — Progress Notes (Signed)
Patient appeared very agitated at the beginning of the shift at 1500. She was very loud and and attempted to walk out of her room naked. She was loud . Pt redirected and responded well to redirection. Although thought process disorganized and tangential. Patient advised to stay on the unit during supper. She was upset about that and was tearful. Staff encouraged patient. Internal medicine doctor visited. Patient missed her 1400 potassium chloride, MD ordered a now dose of 20 20 meq with the 20 meq already ordered for 1700. Patient received total dose of 40 meq at 1700. Q15 min check continues to maintain safety.
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
## 2011-05-18 NOTE — Progress Notes (Signed)
Pt has been sleeping most of shift, still asleep at 1315. According to report, pt has not slept all night. Before pt fell asleep, she was yelling, trying to get off the unit, refusing to follow directions. Pt was incontinent of urine. Pt helped to wheelchair, fell asleep in wheelchair this morning during D/C planning group. Unable to get lucid responses from pt, pt remains paranoid, delusional, possibly combative.

## 2011-05-18 NOTE — Progress Notes (Signed)
Patient ID: [name], female   DOB: 04-01-1951, 60 y.o.   MRN: [id number]   Contacted Internal Medicine for consult on hypokalemia, cxr showed suspicious of CHF and bilateral edema. They will come see patient.

## 2011-05-18 NOTE — BH Assessment (Addendum)
Date: 05-18-11  Identifying information: Pt. is a 60 year old caucasian female. Admitted from [facility] ED with complaint of auditory hallucinations and evidence of poor self care by the [employer].  Reasons for Admission: Per ED/nurses notes, Pt. Was admitted from [facility]. She was apparently living alone. She was discovered with feces all over her and her apartment. She was brought to the [facility] by the [employer].  Pt was unable to answer or provide any pertinent information. She was uncooperative and combative at the time. She was also noted to be hallucinating while responding to an internal stimuli. Since arriving to the unit, nurses notes indicated that she was urinating all over her room floor.  Past psychiatric history (state source): Schizophrenia paranoia-type, Schizoaffective disoder  Social History: Pt. Lives alone in an apartment.  Family history: Pt. Is unable to provide this information due to altered mental status.  Alcohol/drug use: Former smoker, drinks 1.2 oz of alcohol weekly.   Medical history/problems: Hypothyroidism, Asthma, Mental retardation, Degenerative joint disease, UTI, Atria fibrillation, GERD, Diabetes Mellitus, Hypertension, Cholelithiasis, Obesity, Bilateral Lower extremity edema, MI,   Primary care provider: Pt. Is unable to provide this information related to her altered mental status.  Medications: Lorazepam 2 mg orally daily.                        Milk Magnesia 30 ml orally PRN                        Mylanta 30 ml orally PRN                        [name] 6 mg orally daily                        Depakote Er 1000 mg orally at bedtime                        Acetaminophen 650 mg orally every 6 hours PRN  Drug allergies: Penicillin, Sulfamethoxazole with trimethoprim,   Positive physical findings: Obese caucasian female. Bilateral lower extremity swellings present. Pt. Is lying in bed, sleepy and snoring, however, easily aroused. Lung fields bilaterally clear. Partially  follows instructions. Raised purplish mole to Lt. Side of neck area.  Mental status exam: Alert and oriented x 2. Pt. Able to state her first and last names, and aware she is in the hospital of which she states [facility]. I observed Pt. In a group setting sitting up in a wheelchair. She responded inappropriately to questions with slurred speech. She also blots out some incoherent words from time to time.   Appearance and behavior: Obese female, sleepy, hair disheveled.  Bilateral lower extremity swellings present.  Speech: Somewhat slurred, but able to make self understood some.  Mood: unable to asses related to pt. Falling in out of sleep.  Affect: Labile, Pt. at times laughs inappropriately while making some incoherent statements.  Thought process: Tangential, some coherent/incoherent statements   Cognition: Ability to make decisions is limited at this time altered mental status.  Axis 1: Schizophrenia paranoia-type, Schizoaffective disorder.  Axis 11: Hypertension, Asthma, Mental Retardation, Myocardial infarction.  Axis 1V: Poor support system, poor self care, Chronic mental illness/medical condition  Axis V: 25.  Plan: Will obtain complete metabolic panel, T3, T4, TSH, HGBA1C, CBC  KCL 20mg   Orally once daily,           Monitor mood and behavior/medical condition           Monitor oxygen Saturation q shift.           Vital signs check q shift manually.           Activities as tolerated.           Monitor intake.             Tentative length of stay/discharge:    Md signature_____________________________       Date/time:  H&P dictated_______________________

## 2011-05-18 NOTE — Progress Notes (Addendum)
In day room, watching TV on approach. Appears calm and is cooperative with assessment. Continues to be disorganized and tangential, but is not making any overt psychotic/delusional statements. Appears to be GREATLY improved vs writers experience with pt from previous night. Did refuse vital signs per MHT. Denies SI/HI/AVH and contracts for safety. Denies any pain or discomfort. No questions or concerns expressed. POC and medications for the shift reviewed and understanding verbalized (will need reinforcement r/t current mental condition.). Safety has been maintained with Q71minute observation. Will continue current POC.  Went in to dayroom to see if she would allow me to take her vital signs. Was agreeable, see epic charting. During the assessment, pt accused peer of being a devil as she left the dayroom.

## 2011-05-18 NOTE — Consult Note (Addendum)
PCP:  Jennifer Manson, MD   DOA:  05/17/2011 10:51 PM  Reason for consult: For management of hypokalemia and suspected CHF   HPI: This a 60 y/o women who was admitted to [facility] for psychosis secondary to schizoaffective disorder after she was brought in  to the ED with hallucinations ,she was given multiple sedatives to calm her down. Labs showed  Hypokalemia with K of 2.9 ,CXR showed pulmonary vascular congestion.we are consulted to help with medical management .Patient seen and examined ,laughing ,denies any SOB or chest pain .  Allergies: Allergies  Allergen Reactions  . Sulfamethoxazole W/Trimethoprim Nausea And Vomiting  . Penicillins Rash    Prior to Admission medications   Medication Sig Start Date End Date Taking? Authorizing Provider  amLODipine (NORVASC) 5 MG tablet Take 5 mg by mouth daily.    Yes Historical Provider, MD  ciprofloxacin (CIPRO) 500 MG tablet Take 500 mg by mouth 2 (two) times daily. She was to take for 10 days.    Yes Historical Provider, MD  dabigatran (PRADAXA) 150 MG CAPS Take 150 mg by mouth 2 (two) times daily.    Yes Historical Provider, MD  divalproex (DEPAKOTE ER) 500 MG 24 hr tablet Take 1,000 mg by mouth at bedtime.     Yes Historical Provider, MD  folic acid (FOLVITE) 1 MG tablet Take 1 mg by mouth daily.    Yes Historical Provider, MD  levothyroxine (SYNTHROID, LEVOTHROID) 112 MCG tablet Take 112 mcg by mouth every morning. Take before breakfast.   Yes Historical Provider, MD  lisinopril (PRINIVIL,ZESTRIL) 40 MG tablet Take 40 mg by mouth every 12 (twelve) hours.     Yes Historical Provider, MD  metoprolol (LOPRESSOR) 50 MG tablet Take 50 mg by mouth 2 (two) times daily.     Yes Historical Provider, MD  nabumetone (RELAFEN) 500 MG tablet Take 500 mg by mouth every 12 (twelve) hours as needed. For back pain.   Yes Historical Provider, MD  omeprazole (PRILOSEC) 20 MG capsule Take 20 mg by mouth 2 (two) times daily.    Yes Historical Provider, MD    perphenazine (TRILAFON) 2 MG tablet Take by mouth. Take one tablet each morning and three tablets at bedtime.   Yes Historical Provider, MD  benztropine (COGENTIN) 1 MG tablet Take 1 mg by mouth 2 (two) times daily.      Historical Provider, MD    Past Medical History  Diagnosis Date  . Schizoaffective disorder   . Urinary tract infection   . Hypertension   . Atrial fibrillation   . GERD (gastroesophageal reflux disease)   . Hypothyroidism   . Myocardial infarction   . Diabetes mellitus   . Obesity     Past Surgical History  Procedure Date  . Tubal ligation   . Abdominal hysterectomy     Social History:  Review of Systems:  Difficult to obtain ,however denies SOB,chest pain or pedal edema .Also stated that she has;t been eating well.   Physical Exam:  Filed Vitals:   05/17/11 2330 05/18/11 0830  BP: 129/85 142/88  Pulse: 104 94  Temp: 99.6 F (37.6 C) 98.1 F (36.7 C)  TempSrc:  Oral  Resp: 20 20    Alert ,laughing ,not in distress  Neck: Supple,No JVD  Cardiovascular: RRR, S1 normal, S2 normal, Chest: CTAB, no wheezes, rales, or rhonchi Abdominal: Soft. Non-tender, non-distended, bowel sounds are normal.  Ext: no edema    Labs on Admission:  Results for orders placed during the  hospital encounter of 05/16/11 (from the past 48 hour(s))  CBC     Status: Abnormal   Collection Time   05/16/11  9:00 PM      Component Value Range Comment   WBC 10.9 (*) 4.0 - 10.5 (K/uL)    RBC 3.09 (*) 3.87 - 5.11 (MIL/uL)    Hemoglobin 9.9 (*) 12.0 - 15.0 (g/dL)    HCT 04.5 (*) 40.9 - 46.0 (%)    MCV 97.4  78.0 - 100.0 (fL)    MCH 32.0  26.0 - 34.0 (pg)    MCHC 32.9  30.0 - 36.0 (g/dL)    RDW 81.1  91.4 - 78.2 (%)    Platelets 233  150 - 400 (K/uL)   COMPREHENSIVE METABOLIC PANEL     Status: Abnormal   Collection Time   05/16/11  9:00 PM      Component Value Range Comment   Sodium 137  135 - 145 (mEq/L)    Potassium 2.9 (*) 3.5 - 5.1 (mEq/L)    Chloride 102  96 -  112 (mEq/L)    CO2 25  19 - 32 (mEq/L)    Glucose, Bld 93  70 - 99 (mg/dL)    BUN 8  6 - 23 (mg/dL)    Creatinine, Ser 9.56  0.50 - 1.10 (mg/dL)    Calcium 9.2  8.4 - 10.5 (mg/dL)    Total Protein 6.6  6.0 - 8.3 (g/dL)    Albumin 3.2 (*) 3.5 - 5.2 (g/dL)    AST 12  0 - 37 (U/L)    ALT 10  0 - 35 (U/L)    Alkaline Phosphatase 47  39 - 117 (U/L)    Total Bilirubin 0.2 (*) 0.3 - 1.2 (mg/dL)    GFR calc non Af Amer 66 (*) >90 (mL/min)    GFR calc Af Amer 77 (*) >90 (mL/min)   ETHANOL     Status: Normal   Collection Time   05/16/11  9:00 PM      Component Value Range Comment   Alcohol, Ethyl (B) <11  0 - 11 (mg/dL)   URINE RAPID DRUG SCREEN (HOSP PERFORMED)     Status: Normal   Collection Time   05/16/11  9:21 PM      Component Value Range Comment   Opiates NONE DETECTED  NONE DETECTED     Cocaine NONE DETECTED  NONE DETECTED     Benzodiazepines NONE DETECTED  NONE DETECTED     Amphetamines NONE DETECTED  NONE DETECTED     Tetrahydrocannabinol NONE DETECTED  NONE DETECTED     Barbiturates NONE DETECTED  NONE DETECTED    GLUCOSE, CAPILLARY     Status: Normal   Collection Time   05/17/11 11:40 AM      Component Value Range Comment   Glucose-Capillary 94  70 - 99 (mg/dL)   URINALYSIS, ROUTINE W REFLEX MICROSCOPIC     Status: Abnormal   Collection Time   05/17/11  1:42 PM      Component Value Range Comment   Color, Urine AMBER (*) YELLOW  BIOCHEMICALS MAY BE AFFECTED BY COLOR   APPearance CLOUDY (*) CLEAR     Specific Gravity, Urine 1.018  1.005 - 1.030     pH 6.0  5.0 - 8.0     Glucose, UA NEGATIVE  NEGATIVE (mg/dL)    Hgb urine dipstick NEGATIVE  NEGATIVE     Bilirubin Urine MODERATE (*) NEGATIVE     Ketones, ur 15 (*)  NEGATIVE (mg/dL)    Protein, ur 30 (*) NEGATIVE (mg/dL)    Urobilinogen, UA 1.0  0.0 - 1.0 (mg/dL)    Nitrite NEGATIVE  NEGATIVE     Leukocytes, UA NEGATIVE  NEGATIVE    URINE MICROSCOPIC-ADD ON     Status: Abnormal   Collection Time   05/17/11  1:42 PM       Component Value Range Comment   Squamous Epithelial / LPF FEW (*) RARE     Bacteria, UA FEW (*) RARE     Casts GRANULAR CAST (*) NEGATIVE     Urine-Other MUCOUS PRESENT     GLUCOSE, CAPILLARY     Status: Abnormal   Collection Time   05/17/11  4:38 PM      Component Value Range Comment   Glucose-Capillary 107 (*) 70 - 99 (mg/dL)    Comment 1 Documented in Chart      Comment 2 Notify RN       Radiological Exams on Admission: No results found.  Assessment/Plan Principal Problem:  *Schizoaffective disorder, bipolar type Management as per psych service 2/Hypokalemia Probably secondary to poor intake ,already being repleted  Given 20 meq of kcl ,will give extra 20 meq now ,f/u repeat labs including magnesium level and continue to replete PRN  3/ Abnormal CXR findings  Clinically not volume overloaded ,lungs clear ,no pedal edema or JVD.Check 2 D echo  When stable .Pro-BNP ,  lasix prn  4/chronic atrial fibrillation:rate controlled , recommend to continue metoprolol and Pradaxa for now but we need to rediscuss with Dr Sharyn Lull if she is a candidate for pradaxa If she is a risk of falls we should consider switching to ASA . 5/Hypothyrodism:continue synthroid ,f/u TSH level 6/HTN: Fair,continue home meds. 7/Hyperglycemia: check HBA1C , 8/Anemia :chronic ,check anemia panel ,further W/U can be done as outpatient. 9/Please verify all home medication with pharmacy ,since patient is not reliable.    Time Spent on Admission: 50 MINUTES   [name] 05/18/2011, 3:51 PM

## 2011-05-18 NOTE — Discharge Planning (Signed)
Patient attended Aftercare Planning Group, but slept through its entirety.  When she did awaken and speak, could not understand her.  Per State Regulation 482.30  This chart was reviewed for medical necessity with respect to the patient's Admission/Duration of stay.   Next review due:  05/21/11   Ambrose Mantle, LCSW  05/18/2011  12:35 PM
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
## 2011-05-18 NOTE — Tx Team (Signed)
Initial Interdisciplinary Treatment Plan  PATIENT STRENGTHS: (choose at least two) Active sense of humor  PATIENT STRESSORS: Health problems Medication change or noncompliance   PROBLEM LIST: Problem List/Patient Goals Date to be addressed Date deferred Reason deferred Estimated date of resolution  Pt. Is psychotic      Responding to internal stimuli                                    Goal; unable to assess      (get back on meds)       DISCHARGE CRITERIA:  Ability to meet basic life and health needs Adequate post-discharge living arrangements Improved stabilization in mood, thinking, and/or behavior Safe-care adequate arrangements made  PRELIMINARY DISCHARGE PLAN: Attend PHP/IOP Outpatient therapy Participate in family therapy Placement in alternative living arrangements  PATIENT/FAMIILY INVOLVEMENT: This treatment plan has been presented to and reviewed with the patient, [name], and/or family member, .  The patient and family have been given the opportunity to ask questions and make suggestions.  [name] 05/18/2011, 1:33 AM

## 2011-05-18 NOTE — Progress Notes (Signed)
[facility] Group Notes:  (Counselor/Nursing/MHT/Case Management/Adjunct)  05/18/2011 2:33 PM  Type of Therapy:  [employer] Peer Group  Participation Level:  Did Not Attend  Wilmon Arms 05/18/2011, 2:33 PM Cosigned by Veto Kemps, MT-BC

## 2011-05-19 ENCOUNTER — Other Ambulatory Visit: Payer: Self-pay

## 2011-05-19 LAB — CBC
HCT: 26.6 % — ABNORMAL LOW (ref 36.0–46.0)
Hemoglobin: 8.6 g/dL — ABNORMAL LOW (ref 12.0–15.0)
MCH: 31.9 pg (ref 26.0–34.0)
RBC: 2.7 MIL/uL — ABNORMAL LOW (ref 3.87–5.11)

## 2011-05-19 LAB — VITAMIN B12: Vitamin B-12: 519 pg/mL (ref 211–911)

## 2011-05-19 LAB — BASIC METABOLIC PANEL
CO2: 26 mEq/L (ref 19–32)
Chloride: 102 mEq/L (ref 96–112)
GFR calc non Af Amer: 66 mL/min — ABNORMAL LOW (ref >90)
Glucose, Bld: 90 mg/dL (ref 70–99)
Potassium: 3.2 mEq/L — ABNORMAL LOW (ref 3.5–5.1)
Sodium: 135 mEq/L (ref 135–145)

## 2011-05-19 LAB — IRON AND TIBC
Saturation Ratios: 8 % — ABNORMAL LOW (ref 20–55)
TIBC: 224 ug/dL — ABNORMAL LOW (ref 250–470)

## 2011-05-19 LAB — HEMOGLOBIN A1C
Hgb A1c MFr Bld: 5.6 % (ref <5.7)
Mean Plasma Glucose: 114 mg/dL (ref <117)

## 2011-05-19 LAB — TSH: TSH: 8.903 u[IU]/mL — ABNORMAL HIGH (ref 0.350–4.500)

## 2011-05-19 LAB — RETICULOCYTES: Retic Ct Pct: 3.9 % — ABNORMAL HIGH (ref 0.4–3.1)

## 2011-05-19 LAB — T4, FREE: Free T4: 1.07 ng/dL (ref 0.80–1.80)

## 2011-05-19 MED ORDER — POTASSIUM CHLORIDE CRYS ER 20 MEQ PO TBCR
20.0000 meq | EXTENDED_RELEASE_TABLET | Freq: Two times a day (BID) | ORAL | Status: DC
  Administered 2011-05-19 – 2011-05-21 (×4): 20 meq via ORAL
  Filled 2011-05-19 (×5): qty 1

## 2011-05-19 MED ORDER — POLYSACCHARIDE IRON 150 MG PO CAPS
150.0000 mg | ORAL_CAPSULE | Freq: Every day | ORAL | Status: DC
  Administered 2011-05-20 – 2011-05-24 (×5): 150 mg via ORAL
  Filled 2011-05-19 (×7): qty 1

## 2011-05-19 MED ORDER — MAGNESIUM CHLORIDE 64 MG PO TBEC
1.0000 | DELAYED_RELEASE_TABLET | Freq: Every day | ORAL | Status: DC
  Administered 2011-05-19 – 2011-05-20 (×2): 64 mg via ORAL
  Filled 2011-05-19 (×3): qty 1

## 2011-05-19 MED ORDER — LEVOTHYROXINE SODIUM 150 MCG PO TABS
150.0000 ug | ORAL_TABLET | ORAL | Status: DC
  Administered 2011-05-20 – 2011-06-03 (×14): 150 ug via ORAL
  Filled 2011-05-19 (×4): qty 1
  Filled 2011-05-19: qty 7
  Filled 2011-05-19 (×12): qty 1

## 2011-05-19 NOTE — Progress Notes (Signed)
[facility] MD Progress Note  05/19/2011 1:35 PM  Diagnosis:  Axis I: Schizoaffective Disorder - Bipolar Type.   The patient was seen today and reports the following:   ADL's: Remains Impaired  Sleep: The patient reports to sleeping well last night.  Appetite: The patient reports a decreased appetite.   Mild>(1-10) >Severe  Hopelessness (1-10): 0  Depression (1-10): 0  Anxiety (1-10): 0   Suicidal Ideation: The patient denies any suicidal ideations today.  Plan: No  Intent: No  Means: No   Homicidal Ideation: The patient denies any homicidal ideations today.  Plan: No  Intent: No.  Means: No   General Appearance /Behavior: Dishevel and unkempt.  Eye Contact: Fair.  Speech: Pressured.  Motor Behavior: Moderately slowed.  Level of Consciousness: Alert  Mental Status: Alert and Oriented x 3. Mood: Depressed - Moderately Manic.  Affect: Flat.  Anxiety Level: Expansive.  Thought Process: Fragmented.  Thought Content: The patient reports paranoid thinking as well as auditory hallucinations. No visual hallucinations noted or reported. She states that she is "a female God" as well as a Armed forces technical officer" and "the boss to everyone here." Perception: Poor.  Judgment: Poor.  Insight: Poor.  Cognition: Impaired  Sleep:  Number of Hours: 3.75   Vital Signs:Blood pressure 113/69, pulse 101, temperature 98.3 F (36.8 C), temperature source Oral, resp. rate 18, SpO2 97.00%.  Lab Results:  Results for orders placed during the hospital encounter of 05/17/11 (from the past 48 hour(s))  COMPREHENSIVE METABOLIC PANEL     Status: Abnormal   Collection Time   05/18/11  7:59 PM      Component Value Range Comment   Sodium 134 (*) 135 - 145 (mEq/L)    Potassium 3.5  3.5 - 5.1 (mEq/L)    Chloride 100  96 - 112 (mEq/L)    CO2 26  19 - 32 (mEq/L)    Glucose, Bld 117 (*) 70 - 99 (mg/dL)    BUN 5 (*) 6 - 23 (mg/dL)    Creatinine, Ser 7.82  0.50 - 1.10 (mg/dL)    Calcium 8.7  8.4 - 10.5 (mg/dL)    Total Protein 6.4  6.0 - 8.3 (g/dL)    Albumin 3.0 (*) 3.5 - 5.2 (g/dL)    AST 11  0 - 37 (U/L)    ALT 9  0 - 35 (U/L)    Alkaline Phosphatase 49  39 - 117 (U/L)    Total Bilirubin 0.2 (*) 0.3 - 1.2 (mg/dL)    GFR calc non Af Amer 69 (*) >90 (mL/min)    GFR calc Af Amer 80 (*) >90 (mL/min)   CBC     Status: Abnormal   Collection Time   05/18/11  7:59 PM      Component Value Range Comment   WBC 7.8  4.0 - 10.5 (K/uL)    RBC 2.95 (*) 3.87 - 5.11 (MIL/uL)    Hemoglobin 9.4 (*) 12.0 - 15.0 (g/dL)    HCT 95.6 (*) 21.3 - 46.0 (%)    MCV 98.3  78.0 - 100.0 (fL)    MCH 31.9  26.0 - 34.0 (pg)    MCHC 32.4  30.0 - 36.0 (g/dL)    RDW 08.6  57.8 - 46.9 (%)    Platelets 196  150 - 400 (K/uL)   TSH     Status: Abnormal   Collection Time   05/18/11  7:59 PM      Component Value Range Comment   TSH 8.903 (*)  0.350 - 4.500 (uIU/mL)   T4, FREE     Status: Normal   Collection Time   05/18/11  7:59 PM      Component Value Range Comment   Free T4 1.07  0.80 - 1.80 (ng/dL)   T3, FREE     Status: Abnormal   Collection Time   05/18/11  7:59 PM      Component Value Range Comment   T3, Free 2.2 (*) 2.3 - 4.2 (pg/mL)   HEMOGLOBIN A1C     Status: Normal   Collection Time   05/18/11  7:59 PM      Component Value Range Comment   Hemoglobin A1C 5.6  <5.7 (%)    Mean Plasma Glucose 114  <117 (mg/dL)   VALPROIC ACID LEVEL     Status: Abnormal   Collection Time   05/18/11  7:59 PM      Component Value Range Comment   Valproic Acid Lvl 38.0 (*) 50.0 - 100.0 (ug/mL)   BASIC METABOLIC PANEL     Status: Abnormal   Collection Time   05/19/11  6:32 AM      Component Value Range Comment   Sodium 135  135 - 145 (mEq/L)    Potassium 3.2 (*) 3.5 - 5.1 (mEq/L)    Chloride 102  96 - 112 (mEq/L)    CO2 26  19 - 32 (mEq/L)    Glucose, Bld 90  70 - 99 (mg/dL)    BUN 5 (*) 6 - 23 (mg/dL)    Creatinine, Ser 1.61  0.50 - 1.10 (mg/dL)    Calcium 8.5  8.4 - 10.5 (mg/dL)    GFR calc non Af Amer 66 (*) >90  (mL/min)    GFR calc Af Amer 76 (*) >90 (mL/min)   CBC     Status: Abnormal   Collection Time   05/19/11  6:32 AM      Component Value Range Comment   WBC 5.7  4.0 - 10.5 (K/uL)    RBC 2.70 (*) 3.87 - 5.11 (MIL/uL)    Hemoglobin 8.6 (*) 12.0 - 15.0 (g/dL)    HCT 09.6 (*) 04.5 - 46.0 (%)    MCV 98.5  78.0 - 100.0 (fL)    MCH 31.9  26.0 - 34.0 (pg)    MCHC 32.3  30.0 - 36.0 (g/dL)    RDW 40.9  81.1 - 91.4 (%)    Platelets 186  150 - 400 (K/uL)   RETICULOCYTES     Status: Abnormal   Collection Time   05/19/11  6:32 AM      Component Value Range Comment   Retic Ct Pct 3.9 (*) 0.4 - 3.1 (%)    RBC. 2.70 (*) 3.87 - 5.11 (MIL/uL)    Retic Count, Manual 105.3  19.0 - 186.0 (K/uL)   MAGNESIUM     Status: Abnormal   Collection Time   05/19/11  6:32 AM      Component Value Range Comment   Magnesium 1.3 (*) 1.5 - 2.5 (mg/dL)    Treatment Plan Summary:  1. Daily contact with patient to assess and evaluate symptoms and progress in treatment  2. Medication management  3. The patient will deny suicidal ideations or homicidal ideations for 48 hours prior to discharge and have a depression and anxiety rating of 3 or less. The patient will also deny any auditory or visual hallucinations or delusional thinking and display no manic or hypomanic behaviors.   Plan:  1. Will continue  current medications.  2. Will continue Potassium Chloride 20 mEqs po BID x 5 doses to further address her hypokalemia. 3. Continue to monitor.   Everhett Bozard 05/19/2011, 1:35 PM

## 2011-05-19 NOTE — Progress Notes (Signed)
[facility] Group Notes:  (Counselor/Nursing/MHT/Case Management/Adjunct)  05/19/2011 3:55 PM  Type of Therapy:  1:15PM Music Therapy  Participation Level:  Did Not Attend  Wilmon Arms 05/19/2011, 3:55 PM Cosigned by Veto Kemps, MT-BC

## 2011-05-19 NOTE — Progress Notes (Signed)
Pt. In her room lying  In bed facing the window laughing and talking to herself with rapid speech.  Asked pt. Did she need anything.   Pt. Reports that she "just wants some snuff and vodka."  Pt. Told Clinical research associate that Clinical research associate was her mother.   Pt. Was told that Clinical research associate was her nurse. Pt. Denies pain and denies SI/HI and denies A/V  Hallucinations. Will continue to assess

## 2011-05-19 NOTE — Progress Notes (Signed)
Pt has been up today, did attend group session and also participated in treatment team, pt delusional today, speaking about being a federal judge, speaking about being a God, laughing inappropriately to herself, calling people demon's, but did accept the fact that she was here and that she needed help, pt did receive medications today without incident, will continue to monitor.

## 2011-05-19 NOTE — Progress Notes (Signed)
Pt. B/P 189/100.  Dr. Allena Katz notified.  Pt. Denies dizzines or headache. Pt. Given scheduled order of metoprolol 50mg .  No new orders given.  Will continue to assess.

## 2011-05-19 NOTE — Progress Notes (Signed)
05/19/2011 11:20 AM Case Management Note: [name] had a difficult time sharing facts in group today due to delusions such as believing that she is a federal judge, she is the female G-D and that she is [age] years old. Pt however was able to confirm that she had been receiving medication management from [facility] and that she had gotten out of taking medications. Star talked about wanting to see her husband Geophysical data processor. Pt was present during group suicide prevention information session and showed understanding of the risks, warning signs, what to do, and who to call as shown by [name]'s attentiveness.

## 2011-05-19 NOTE — Progress Notes (Signed)
Subjective: Patient seen ,sitting in the day room ,alert ,talking about demons in the room .  Objective: Vital signs in last 24 hours: Temp:  [97.4 F (36.3 C)-98.8 F (37.1 C)] 98.3 F (36.8 C) (11/29 0747) Pulse Rate:  [86-123] 101  (11/29 0747) Resp:  [12-18] 18  (11/29 0747) BP: (111-116)/(69-82) 113/69 mmHg (11/29 0747) SpO2:  [97 %-99 %] 97 % (11/28 2234) Weight change:  Last BM Date: 05/19/11  Intake/Output from previous day:      Physical Exam: Refusing exam ,however not in apparent distress .     Lab Results: Results for orders placed during the hospital encounter of 05/17/11 (from the past 24 hour(s))  COMPREHENSIVE METABOLIC PANEL     Status: Abnormal   Collection Time   05/18/11  7:59 PM      Component Value Range   Sodium 134 (*) 135 - 145 (mEq/L)   Potassium 3.5  3.5 - 5.1 (mEq/L)   Chloride 100  96 - 112 (mEq/L)   CO2 26  19 - 32 (mEq/L)   Glucose, Bld 117 (*) 70 - 99 (mg/dL)   BUN 5 (*) 6 - 23 (mg/dL)   Creatinine, Ser 1.30  0.50 - 1.10 (mg/dL)   Calcium 8.7  8.4 - 86.5 (mg/dL)   Total Protein 6.4  6.0 - 8.3 (g/dL)   Albumin 3.0 (*) 3.5 - 5.2 (g/dL)   AST 11  0 - 37 (U/L)   ALT 9  0 - 35 (U/L)   Alkaline Phosphatase 49  39 - 117 (U/L)   Total Bilirubin 0.2 (*) 0.3 - 1.2 (mg/dL)   GFR calc non Af Amer 69 (*) >90 (mL/min)   GFR calc Af Amer 80 (*) >90 (mL/min)  CBC     Status: Abnormal   Collection Time   05/18/11  7:59 PM      Component Value Range   WBC 7.8  4.0 - 10.5 (K/uL)   RBC 2.95 (*) 3.87 - 5.11 (MIL/uL)   Hemoglobin 9.4 (*) 12.0 - 15.0 (g/dL)   HCT 78.4 (*) 69.6 - 46.0 (%)   MCV 98.3  78.0 - 100.0 (fL)   MCH 31.9  26.0 - 34.0 (pg)   MCHC 32.4  30.0 - 36.0 (g/dL)   RDW 29.5  28.4 - 13.2 (%)   Platelets 196  150 - 400 (K/uL)  TSH     Status: Abnormal   Collection Time   05/18/11  7:59 PM      Component Value Range   TSH 8.903 (*) 0.350 - 4.500 (uIU/mL)  T4, FREE     Status: Normal   Collection Time   05/18/11  7:59 PM   Component Value Range   Free T4 1.07  0.80 - 1.80 (ng/dL)  T3, FREE     Status: Abnormal   Collection Time   05/18/11  7:59 PM      Component Value Range   T3, Free 2.2 (*) 2.3 - 4.2 (pg/mL)  HEMOGLOBIN A1C     Status: Normal   Collection Time   05/18/11  7:59 PM      Component Value Range   Hemoglobin A1C 5.6  <5.7 (%)   Mean Plasma Glucose 114  <117 (mg/dL)  VALPROIC ACID LEVEL     Status: Abnormal   Collection Time   05/18/11  7:59 PM      Component Value Range   Valproic Acid Lvl 38.0 (*) 50.0 - 100.0 (ug/mL)  BASIC METABOLIC PANEL  Status: Abnormal   Collection Time   05/19/11  6:32 AM      Component Value Range   Sodium 135  135 - 145 (mEq/L)   Potassium 3.2 (*) 3.5 - 5.1 (mEq/L)   Chloride 102  96 - 112 (mEq/L)   CO2 26  19 - 32 (mEq/L)   Glucose, Bld 90  70 - 99 (mg/dL)   BUN 5 (*) 6 - 23 (mg/dL)   Creatinine, Ser 1.61  0.50 - 1.10 (mg/dL)   Calcium 8.5  8.4 - 09.6 (mg/dL)   GFR calc non Af Amer 66 (*) >90 (mL/min)   GFR calc Af Amer 76 (*) >90 (mL/min)  CBC     Status: Abnormal   Collection Time   05/19/11  6:32 AM      Component Value Range   WBC 5.7  4.0 - 10.5 (K/uL)   RBC 2.70 (*) 3.87 - 5.11 (MIL/uL)   Hemoglobin 8.6 (*) 12.0 - 15.0 (g/dL)   HCT 04.5 (*) 40.9 - 46.0 (%)   MCV 98.5  78.0 - 100.0 (fL)   MCH 31.9  26.0 - 34.0 (pg)   MCHC 32.3  30.0 - 36.0 (g/dL)   RDW 81.1  91.4 - 78.2 (%)   Platelets 186  150 - 400 (K/uL)  VITAMIN B12     Status: Normal   Collection Time   05/19/11  6:32 AM      Component Value Range   Vitamin B-12 519  211 - 911 (pg/mL)  FOLATE     Status: Normal   Collection Time   05/19/11  6:32 AM      Component Value Range   Folate >20.0    IRON AND TIBC     Status: Abnormal   Collection Time   05/19/11  6:32 AM      Component Value Range   Iron 18 (*) 42 - 135 (ug/dL)   TIBC 956 (*) 213 - 086 (ug/dL)   Saturation Ratios 8 (*) 20 - 55 (%)   UIBC 206  125 - 400 (ug/dL)  FERRITIN     Status: Normal   Collection Time    05/19/11  6:32 AM      Component Value Range   Ferritin 55  10 - 291 (ng/mL)  RETICULOCYTES     Status: Abnormal   Collection Time   05/19/11  6:32 AM      Component Value Range   Retic Ct Pct 3.9 (*) 0.4 - 3.1 (%)   RBC. 2.70 (*) 3.87 - 5.11 (MIL/uL)   Retic Count, Manual 105.3  19.0 - 186.0 (K/uL)  MAGNESIUM     Status: Abnormal   Collection Time   05/19/11  6:32 AM      Component Value Range   Magnesium 1.3 (*) 1.5 - 2.5 (mg/dL)    Studies/Results: No results found.  Medications:    . amLODipine  5 mg Oral Daily  . dabigatran  150 mg Oral BH-qamhs  . divalproex  1,000 mg Oral QHS  . folic acid  1 mg Oral Daily  . levothyroxine  150 mcg Oral Q0700  . LORazepam  2 mg Oral Once  . magnesium chloride  1 tablet Oral Daily  . metoprolol  50 mg Oral BID  . paliperidone  6 mg Oral Daily  . pantoprazole  40 mg Oral Q1200  . potassium chloride  20 mEq Oral BID  . potassium chloride  20 mEq Oral Once  . potassium chloride  20 mEq Oral BID  . DISCONTD: divalproex  1,000 mg Oral QHS  . DISCONTD: levothyroxine  112 mcg Oral Q0700    acetaminophen, alum & mag hydroxide-simeth, LORazepam, magnesium hydroxide     Assessment/Plan:  Principal Problem:  *Schizoaffective disorder, bipolar type  Management as per psych service  2/Hypokalemia /Hypomagnesima Continue to replete K and mag 3/ Abnormal CXR findings  Clinically not volume overloaded .Check 2 D echo When stable . lasix prn  4/chronic atrial fibrillation:rate controlled , recommend to continue metoprolol , Ispoke to Dr Sharyn Lull regarding  Pradaxa  He recommended to check EKG ,if she is in sinus rhythm to stop pradaxa and switch to ASA ,however to keep on it if she is still in Afib . 5/Hypothyrodism:continue synthroid  At current dose,free T4 at normal level. 6/HTN: controlled ,continue home meds.  7/Hyperglycemia:  HBA1C 5.6% ,ok  8/Anemia :iron deficient ,start oral iron ,further W/U can be done as outpatient.  9/will  continue to follow remotely       LOS: 2 days   Anitra Doxtater 05/19/2011, 4:36 PM

## 2011-05-19 NOTE — Progress Notes (Signed)
[facility] Group Notes:  (Counselor/Nursing/MHT/Case Management/Adjunct)  05/19/2011 3:50 PM  Type of Therapy:  9:30AM Group Therapy  Participation Level:  None  Participation Quality:  Inattentive  Affect:  Inappropriate  Cognitive:  Delusional  Insight:  None  Engagement in Group:  None  Engagement in Therapy:  None  Modes of Intervention:  Orientation and Exploration  Summary of Progress/Problems: Patient reported she was [age] years old. Patient also talked to herself most of the group session and was making gestures in the air. Also, patient did not participate or engage in group discussion.   Wilmon Arms 05/19/2011, 3:50 PM Cosigned by Veto Kemps, MT-BC

## 2011-05-19 NOTE — Tx Team (Signed)
Interdisciplinary Treatment Plan Update (Adult)  Date:  05/19/2011  Time Reviewed:  10:45 AM   Progress in Treatment: Attending groups:  Yes Participating in groups:    Unable to do so, is intrusive and tangential throughout Taking medication as prescribed:    Yes, no refusals Tolerating medication:   Yes, no reported or noted side effects Family/Significant other contact made:  Signed consent for [place] to talk to husband to be, counselor will call Patient understands diagnosis:   No, no insight, poor judgment Discussing patient identified problems/goals with staff:   No, not able to participate in meaningful conversation Medical problems stabilized or resolved:   Med consult done yesterday and report is that she is stable Denies suicidal/homicidal ideation:  Yes Issues/concerns per patient self-inventory:   None Other:  New problem(s) identified: No, Describe:    Reason for Continuation of Hospitalization: Delusions  Medication stabilization  Interventions implemented related to continuation of hospitalization:  Medication monitoring and adjustment, safety checks Q15 min., group therapy, psychoeducation, collateral contact  Additional comments:  Not applicable  Estimated length of stay:  4-5 days  Discharge Plan:  Return to her apartment, goes to [place], but is willing to have referral to ACTT, so Case Manager will explore this option, how many times she has been in hospital in last year, etc.  New goal(s):  Not applicable  Review of initial/current patient goals per problem list:   1.  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  2.  Goal(s):  Improve hygiene to healthy, manageable level  Met:  No  Target date:  By Discharge   As  evidenced by:  Malodorous, says will take a bath later today  Attendees: Patient:  [name]  05/19/2011  10:45 AM   Family:     Physician:  Dr. Harvie Heck Readling 05/19/2011  10:45 AM   Nursing:   Tacy Learn, RN 05/19/2011  10:45 AM   Case Manager:  Ambrose Mantle, LCSW 05/19/2011  10:45 AM   Counselor:  Veto Kemps, MT-BC 05/19/2011  10:45 AM   Other:   Wilmon Arms, counseling intern 05/19/2011  10:45 AM   Other:   Vanetta Mulders, LPCA 05/19/2011  10:45 AM   Other:     Other:      Scribe for Treatment Team:   Sarina Ser, 05/19/2011, 10:45 AM

## 2011-05-20 LAB — BASIC METABOLIC PANEL
CO2: 27 mEq/L (ref 19–32)
Calcium: 9.1 mg/dL (ref 8.4–10.5)
Glucose, Bld: 101 mg/dL — ABNORMAL HIGH (ref 70–99)
Potassium: 3.4 mEq/L — ABNORMAL LOW (ref 3.5–5.1)
Sodium: 136 mEq/L (ref 135–145)

## 2011-05-20 LAB — VALPROIC ACID LEVEL: Valproic Acid Lvl: 26.3 ug/mL — ABNORMAL LOW (ref 50.0–100.0)

## 2011-05-20 MED ORDER — MAGNESIUM OXIDE 400 MG PO TABS
400.0000 mg | ORAL_TABLET | Freq: Every day | ORAL | Status: AC
  Administered 2011-05-21 – 2011-05-23 (×3): 400 mg via ORAL
  Filled 2011-05-20 (×3): qty 1

## 2011-05-20 NOTE — Progress Notes (Signed)
Adult Services Patient-Family Contact/Session  Attendees:  Patient's significant other, [name] [phone])  Goal(s):  Collateral information  Safety Concerns:  None  Narrative:  [name] reported that he and patient have been together 14 years and he is her power of attorney. He stated that she was getting so that she was throwing things, spitting on things, talking out of her head. She had not taken her medications in 3 days. He could not explain why she stopped taking them and stated she usually takes her own medications. Stated that she does pretty good and acts like a regular woman when taking her meds. She can return to living with him. He will provide transportation at discharge.  Barrier(s):  None  Interventions:  Collateral  Recommendation(s):  Outpatient follow up.  Follow-up Required:  No  Explanation:    Veto Kemps 05/20/2011, 2:55 PM

## 2011-05-20 NOTE — BH Assessment (Signed)
Groggy but has slept very little this shift.  Sitting in chair or walking in room most of night.  Disheveled appearance.  Room untidy and in disarray with bedding off both beds and partly onto floor. Refuses Ativan.  Frequent requests for and is drinking large amounts of water, juice, gatarade, and other liquids.  Will request order from MD for CBG or FBS. Alert and oriented as to place and time but not to person or situation.  Says was brought is here after becoming  "dizzy----did not take my medication."   Denies SI, HI, or A/V hallucinations

## 2011-05-20 NOTE — Progress Notes (Signed)
[facility] Group Notes:  (Counselor/Nursing/MHT/Case Management/Adjunct)  05/20/2011 12:45 PM  Type of Therapy:  9:30AM Group Therapy  Participation Level:  Did Not Attend  Wilmon Arms 05/20/2011, 12:45 PM Cosigned by Veto Kemps, MT-BC

## 2011-05-20 NOTE — Progress Notes (Signed)
Pt has stayed in her room today, sitting in wheelchair. Pt appears to be having delusions, slurring speech at times, but smiling. Pt refused to go to cafeteria, meal brought back to her. Smiling, softly laughing, but difficult to understand. Pt taking meds without problems. Pt does not appear to be in acute distress.

## 2011-05-20 NOTE — Discharge Planning (Signed)
Patient did not attend Aftercare Planning Group.  She did meet yesterday with the [facility] staff, and was accepted into that program.  Per State Regulation 482.30  This chart was reviewed for medical necessity with respect to the patient's Admission/Duration of stay.   Next review due:    05/23/11   Ambrose Mantle, LCSW  05/20/2011  2:07 PM

## 2011-05-20 NOTE — Progress Notes (Signed)
[facility] MD Progress Note  05/20/2011 3:39 PM  Diagnosis:  Axis I: Schizoaffective Disorder - Bipolar Type.   The patient was seen today and reports the following:   ADL's: Remains Impaired.  Sleep: The patient reports to sleeping well last night but according to staff slept 2 hours.  Appetite: The patient reports a decreased appetite.   Mild>(1-10) >Severe  Hopelessness (1-10): 0  Depression (1-10): 0  Anxiety (1-10): 0   Suicidal Ideation: The patient denies any suicidal ideations today.  Plan: No  Intent: No  Means: No   Homicidal Ideation: The patient denies any homicidal ideations today.  Plan: No  Intent: No.  Means: No   General Appearance /Behavior: Dishevel and unkempt.  Eye Contact: Fair.  Speech: Slightly Pressured.  Motor Behavior: Moderately slowed.  Level of Consciousness: Alert  Mental Status: Alert and Oriented x 3.  Mood: Depressed - Mildly Manic.  Affect: Flat.  Anxiety Level: Mildly anxious.  Thought Process: Fragmented.  Thought Content: The patient reports ongoing paranoid thinking as well as auditory hallucinations. No visual hallucinations noted or reported.  Perception: Poor.  Judgment: Poor.  Insight: Poor.  Cognition: Impaired  Sleep:  Number of Hours: 2   Vital Signs:Blood pressure 147/98, pulse 92, temperature 98.7 F (37.1 C), temperature source Oral, resp. rate 24, SpO2 97.00%.  Lab Results:  Results for orders placed during the hospital encounter of 05/17/11 (from the past 48 hour(s))  COMPREHENSIVE METABOLIC PANEL     Status: Abnormal   Collection Time   05/18/11  7:59 PM      Component Value Range Comment   Sodium 134 (*) 135 - 145 (mEq/L)    Potassium 3.5  3.5 - 5.1 (mEq/L)    Chloride 100  96 - 112 (mEq/L)    CO2 26  19 - 32 (mEq/L)    Glucose, Bld 117 (*) 70 - 99 (mg/dL)    BUN 5 (*) 6 - 23 (mg/dL)    Creatinine, Ser 1.61  0.50 - 1.10 (mg/dL)    Calcium 8.7  8.4 - 10.5 (mg/dL)    Total Protein 6.4  6.0 - 8.3 (g/dL)    Albumin 3.0 (*) 3.5 - 5.2 (g/dL)    AST 11  0 - 37 (U/L)    ALT 9  0 - 35 (U/L)    Alkaline Phosphatase 49  39 - 117 (U/L)    Total Bilirubin 0.2 (*) 0.3 - 1.2 (mg/dL)    GFR calc non Af Amer 69 (*) >90 (mL/min)    GFR calc Af Amer 80 (*) >90 (mL/min)   CBC     Status: Abnormal   Collection Time   05/18/11  7:59 PM      Component Value Range Comment   WBC 7.8  4.0 - 10.5 (K/uL)    RBC 2.95 (*) 3.87 - 5.11 (MIL/uL)    Hemoglobin 9.4 (*) 12.0 - 15.0 (g/dL)    HCT 09.6 (*) 04.5 - 46.0 (%)    MCV 98.3  78.0 - 100.0 (fL)    MCH 31.9  26.0 - 34.0 (pg)    MCHC 32.4  30.0 - 36.0 (g/dL)    RDW 40.9  81.1 - 91.4 (%)    Platelets 196  150 - 400 (K/uL)   TSH     Status: Abnormal   Collection Time   05/18/11  7:59 PM      Component Value Range Comment   TSH 8.903 (*) 0.350 - 4.500 (uIU/mL)   T4, FREE  Status: Normal   Collection Time   05/18/11  7:59 PM      Component Value Range Comment   Free T4 1.07  0.80 - 1.80 (ng/dL)   T3, FREE     Status: Abnormal   Collection Time   05/18/11  7:59 PM      Component Value Range Comment   T3, Free 2.2 (*) 2.3 - 4.2 (pg/mL)   HEMOGLOBIN A1C     Status: Normal   Collection Time   05/18/11  7:59 PM      Component Value Range Comment   Hemoglobin A1C 5.6  <5.7 (%)    Mean Plasma Glucose 114  <117 (mg/dL)   VALPROIC ACID LEVEL     Status: Abnormal   Collection Time   05/18/11  7:59 PM      Component Value Range Comment   Valproic Acid Lvl 38.0 (*) 50.0 - 100.0 (ug/mL)   BASIC METABOLIC PANEL     Status: Abnormal   Collection Time   05/19/11  6:32 AM      Component Value Range Comment   Sodium 135  135 - 145 (mEq/L)    Potassium 3.2 (*) 3.5 - 5.1 (mEq/L)    Chloride 102  96 - 112 (mEq/L)    CO2 26  19 - 32 (mEq/L)    Glucose, Bld 90  70 - 99 (mg/dL)    BUN 5 (*) 6 - 23 (mg/dL)    Creatinine, Ser 9.62  0.50 - 1.10 (mg/dL)    Calcium 8.5  8.4 - 10.5 (mg/dL)    GFR calc non Af Amer 66 (*) >90 (mL/min)    GFR calc Af Amer 76 (*) >90  (mL/min)   CBC     Status: Abnormal   Collection Time   05/19/11  6:32 AM      Component Value Range Comment   WBC 5.7  4.0 - 10.5 (K/uL)    RBC 2.70 (*) 3.87 - 5.11 (MIL/uL)    Hemoglobin 8.6 (*) 12.0 - 15.0 (g/dL)    HCT 95.2 (*) 84.1 - 46.0 (%)    MCV 98.5  78.0 - 100.0 (fL)    MCH 31.9  26.0 - 34.0 (pg)    MCHC 32.3  30.0 - 36.0 (g/dL)    RDW 32.4  40.1 - 02.7 (%)    Platelets 186  150 - 400 (K/uL)   VITAMIN B12     Status: Normal   Collection Time   05/19/11  6:32 AM      Component Value Range Comment   Vitamin B-12 519  211 - 911 (pg/mL)   FOLATE     Status: Normal   Collection Time   05/19/11  6:32 AM      Component Value Range Comment   Folate >20.0     IRON AND TIBC     Status: Abnormal   Collection Time   05/19/11  6:32 AM      Component Value Range Comment   Iron 18 (*) 42 - 135 (ug/dL)    TIBC 253 (*) 664 - 470 (ug/dL)    Saturation Ratios 8 (*) 20 - 55 (%)    UIBC 206  125 - 400 (ug/dL)   FERRITIN     Status: Normal   Collection Time   05/19/11  6:32 AM      Component Value Range Comment   Ferritin 55  10 - 291 (ng/mL)   RETICULOCYTES     Status: Abnormal  Collection Time   05/19/11  6:32 AM      Component Value Range Comment   Retic Ct Pct 3.9 (*) 0.4 - 3.1 (%)    RBC. 2.70 (*) 3.87 - 5.11 (MIL/uL)    Retic Count, Manual 105.3  19.0 - 186.0 (K/uL)   MAGNESIUM     Status: Abnormal   Collection Time   05/19/11  6:32 AM      Component Value Range Comment   Magnesium 1.3 (*) 1.5 - 2.5 (mg/dL)   BASIC METABOLIC PANEL     Status: Abnormal   Collection Time   05/20/11  6:35 AM      Component Value Range Comment   Sodium 136  135 - 145 (mEq/L)    Potassium 3.4 (*) 3.5 - 5.1 (mEq/L)    Chloride 103  96 - 112 (mEq/L)    CO2 27  19 - 32 (mEq/L)    Glucose, Bld 101 (*) 70 - 99 (mg/dL)    BUN 4 (*) 6 - 23 (mg/dL)    Creatinine, Ser 4.09  0.50 - 1.10 (mg/dL)    Calcium 9.1  8.4 - 10.5 (mg/dL)    GFR calc non Af Amer >90  >90 (mL/min)    GFR calc Af Amer  >90  >90 (mL/min)    Treatment Plan Summary:  1. Daily contact with patient to assess and evaluate symptoms and progress in treatment  2. Medication management  3. The patient will deny suicidal ideations or homicidal ideations for 48 hours prior to discharge and have a depression and anxiety rating of 3 or less. The patient will also deny any auditory or visual hallucinations or delusional thinking and display no manic or hypomanic behaviors.   Plan:  1. Will continue current medications.  2. Continue to monitor.  Randy Readling 05/20/2011, 3:39 PM

## 2011-05-21 DIAGNOSIS — F259 Schizoaffective disorder, unspecified: Principal | ICD-10-CM

## 2011-05-21 LAB — BASIC METABOLIC PANEL
CO2: 27 mEq/L (ref 19–32)
Chloride: 104 mEq/L (ref 96–112)
Sodium: 137 mEq/L (ref 135–145)

## 2011-05-21 MED ORDER — ASPIRIN EC 81 MG PO TBEC
81.0000 mg | DELAYED_RELEASE_TABLET | Freq: Every day | ORAL | Status: DC
  Administered 2011-05-21 – 2011-06-03 (×14): 81 mg via ORAL
  Filled 2011-05-21 (×3): qty 1
  Filled 2011-05-21: qty 7
  Filled 2011-05-21 (×12): qty 1

## 2011-05-21 NOTE — Progress Notes (Signed)
Patient ID: [name], female   DOB: 1950-09-24, 60 y.o.   MRN: [id number] The patient sat in the dayroom all evening talking to herself. Minimal interaction within the milieu. Attended group, but was unable to actively participate. Appears to be responding to internal stimuli.

## 2011-05-21 NOTE — Progress Notes (Signed)
[facility] Group Notes:  (Counselor/Nursing/MHT/Case Management/Adjunct)  05/21/2011 11 AM  Type of Therapy:  Group Therapy, Dance/Movement Therapy   Participation Level:  Did Not Attend  Modes of Intervention:  Clarification, Problem-solving, Role-play, Socialization and Support   [facility]

## 2011-05-21 NOTE — Progress Notes (Signed)
Chart reviewed .Patient was not seen   Objective: Vital signs in last 24 hours: Temp:  [97.7 F (36.5 C)-98.7 F (37.1 C)] 97.7 F (36.5 C) (12/01 0815) Pulse Rate:  [80-102] 102  (12/01 0815) Resp:  [16-18] 18  (12/01 0815) BP: (122-151)/(78-88) 122/78 mmHg (12/01 0815) SpO2:  [97 %] 97 % (11/30 2113) Weight change:  Last BM Date: 05/19/11   Lab Results: Results for orders placed during the hospital encounter of 05/17/11 (from the past 24 hour(s))  POTASSIUM     Status: Normal   Collection Time   05/20/11  7:51 PM      Component Value Range   Potassium 4.0  3.5 - 5.1 (mEq/L)  VALPROIC ACID LEVEL     Status: Abnormal   Collection Time   05/20/11  7:51 PM      Component Value Range   Valproic Acid Lvl 26.3 (*) 50.0 - 100.0 (ug/mL)    Medications:    . amLODipine  5 mg Oral Daily  . dabigatran  150 mg Oral BH-qamhs  . divalproex  1,000 mg Oral QHS  . folic acid  1 mg Oral Daily  . levothyroxine  150 mcg Oral Q0700  . LORazepam  2 mg Oral Once  . magnesium oxide  400 mg Oral Daily  . metoprolol  50 mg Oral BID  . paliperidone  6 mg Oral Daily  . pantoprazole  40 mg Oral Q1200  . polysaccharide iron  150 mg Oral Daily  . potassium chloride  20 mEq Oral BID    acetaminophen, alum & mag hydroxide-simeth, LORazepam, magnesium hydroxide     Assessment/Plan: Principal Problem:  *Schizoaffective disorder, bipolar type  Management as per psych service  2/Hypokalemia /Hypomagnesima  Corrected ,will d/c kcl supplement for now ,however  monitor level and replete PRN. 3/ Abnormal CXR findings  Clinically not volume overloaded .Check 2 D echo When stable . lasix prn  4/chronic atrial fibrillation:rate controlled , continue metoprolol , Ispoke to Dr Sharyn Lull regarding Pradaxa He recommended to check EKG ,if she is in sinus rhythm to stop pradaxa and switch to ASA . EKG was done and showed NSR ,will d/c pradaxa and place on ASA .patient to follow with Dr Sharyn Lull on discharge  . 5/Hypothyrodism:continue synthroid At current dose,free T4 at normal level.  6/HTN: controlled ,continue home meds.  7/Anemia :iron deficient ,continue oral iron ,further W/U can be done as outpatient.  9/willsign off ,please reconsult PRN      LOS: 4 days   Ondra Deboard 05/21/2011, 1:32 PM

## 2011-05-21 NOTE — Progress Notes (Signed)
  [name] is a 60 y.o. female [id number] [birth year]  Subjective/Objective:  Patient seen in the medical records reviewed. Patient is still disorganized unable to provide logical information. She told me that she has leukemia so therefore her body is having body aches all over. She told me nobody believes her that she has a cancer. Patient also doesn't want to get out of the bed she was found incontinent. Assessment/Plan:  I reviewed her scheduled medications I will continue with this current treatment plan.  Filed Vitals:   05/20/11 2113  BP: 151/88  Pulse: 80  Temp: 98.7 F (37.1 C)  Resp: 16    Lab Results:   BMET    Component Value Date/Time   NA 136 05/20/2011 0635   K 4.0 05/20/2011 1951   CL 103 05/20/2011 0635   CO2 27 05/20/2011 0635   GLUCOSE 101* 05/20/2011 0635   BUN 4* 05/20/2011 0635   CREATININE 0.78 05/20/2011 0635   CALCIUM 9.1 05/20/2011 0635   GFRNONAA >90 05/20/2011 0635   GFRAA >90 05/20/2011 9147    Medications:  Scheduled:     . amLODipine  5 mg Oral Daily  . dabigatran  150 mg Oral BH-qamhs  . divalproex  1,000 mg Oral QHS  . folic acid  1 mg Oral Daily  . levothyroxine  150 mcg Oral Q0700  . LORazepam  2 mg Oral Once  . magnesium oxide  400 mg Oral Daily  . metoprolol  50 mg Oral BID  . paliperidone  6 mg Oral Daily  . pantoprazole  40 mg Oral Q1200  . polysaccharide iron  150 mg Oral Daily  . potassium chloride  20 mEq Oral BID  . DISCONTD: magnesium chloride  1 tablet Oral Daily     PRN Meds acetaminophen, alum & mag hydroxide-simeth, LORazepam, magnesium hydroxide   Felicha Frayne S MD 05/21/2011

## 2011-05-22 MED ORDER — PALIPERIDONE ER 6 MG PO TB24
9.0000 mg | ORAL_TABLET | Freq: Every day | ORAL | Status: DC
  Administered 2011-05-23 – 2011-05-24 (×2): 9 mg via ORAL
  Filled 2011-05-22 (×3): qty 1

## 2011-05-22 NOTE — Progress Notes (Signed)
Patient ID: [name], female   DOB: January 12, 1951, 60 y.o.   MRN: [id number]   [facility] Group Notes:  (Counselor/Nursing/MHT/Case Management/Adjunct)  05/22/2011 11 AM  Type of Therapy:  Group Therapy  Participation Level:  Active  Participation Quality:  Redirectable and Resistant  Affect:  Angry  Cognitive:  Confused  Insight:  Limited  Engagement in Group:  Limited  Engagement in Therapy:  Limited  Modes of Intervention:  Clarification, Problem-solving, Role-play, Socialization and Support  Summary of Progress/Problems:   Group focused on what makes a support healthy or unhealthy, as well as how to support themselves when supports are not accessible. Pt. [name] that she was feeling like "god" and that she was going to give everyone money. Pt mentioned that her father is a source of support for her. Pt had to be redirected many times throughout group because she would talk over others. Near the end of the session, pt became defensive.   Thomasena Edis, [employer]

## 2011-05-22 NOTE — Progress Notes (Signed)
Patient ID: [name], female   DOB: 01-Jun-1951, 60 y.o.   MRN: [id number] The patient sat in her room all evening talking to herself and laughing. Her conversation is tangential. She remains delusional and responding to auditory hallucination. Compliant with medication.

## 2011-05-22 NOTE — Progress Notes (Signed)
  [name] is a 60 y.o. female [id number] 03/30/1951  Subjective/Objective:  Patient is still very disorganized responding to inner stimuli, unable to make conversation.  Assessment/Plan:  Increased invega to 9 mg by mouth daily.  Filed Vitals:   05/22/11 0757  BP: 120/78  Pulse: 79  Temp: 97.4 F (36.3 C)  Resp: 18    Lab Results:   BMET    Component Value Date/Time   NA 137 05/21/2011 1935   K 4.1 05/21/2011 1935   CL 104 05/21/2011 1935   CO2 27 05/21/2011 1935   GLUCOSE 93 05/21/2011 1935   BUN 7 05/21/2011 1935   CREATININE 0.83 05/21/2011 1935   CALCIUM 9.4 05/21/2011 1935   GFRNONAA 76* 05/21/2011 1935   GFRAA 88* 05/21/2011 1935    Medications:  Scheduled:     . amLODipine  5 mg Oral Daily  . aspirin EC  81 mg Oral Daily  . divalproex  1,000 mg Oral QHS  . folic acid  1 mg Oral Daily  . levothyroxine  150 mcg Oral Q0700  . LORazepam  2 mg Oral Once  . magnesium oxide  400 mg Oral Daily  . metoprolol  50 mg Oral BID  . paliperidone  6 mg Oral Daily  . pantoprazole  40 mg Oral Q1200  . polysaccharide iron  150 mg Oral Daily  . DISCONTD: dabigatran  150 mg Oral BH-qamhs  . DISCONTD: potassium chloride  20 mEq Oral BID     PRN Meds acetaminophen, alum & mag hydroxide-simeth, LORazepam, magnesium hydroxide   AHLUWALIA,SHAMSHER S MD 05/22/2011

## 2011-05-22 NOTE — Progress Notes (Signed)
Pt has been up and awake throughout the day today, pt presents as confused, delusional, makes some paranoid statements about thinking people are watching her, pt has been unable to participate fully in various activities today and has needed encouragement and assistance to complete various tasks, pt has received all medications today without incident, will continue to monitor.

## 2011-05-23 NOTE — Progress Notes (Signed)
Pt has been in room much of the day today, has been unable to complete any activities in the milieu today, pt laughing and talking and singing to herself in her room, pt was incontinent of urine this morning and was assisted with receiving a shower from staff, pt has been eating and drinking freely, pt has not verbalized any complaints, pt has received all medications without incident, support provided, will continue to monitor

## 2011-05-23 NOTE — Progress Notes (Signed)
[facility] Group Notes:  (Counselor/Nursing/MHT/Case Management/Adjunct)  05/23/2011 11:50 AM  Type of Therapy:  9:30AM Group Therapy  Participation Level:  Did Not Attend  Wilmon Arms 05/23/2011, 11:50 AM Veto Kemps, MT-BC 05/23/2011 2:58 PM

## 2011-05-23 NOTE — Progress Notes (Signed)
Pt lying in bed with eyes open on approach. Alert and oriented to name only. Appears extremely bright/euphoric, but is cooperative with assessment. Smiles inappropriately at times. Continues to be delusional. No acute distress noted. States she has had an ok day r/t having "leukemia in her arms, legs, hands, head and shoulders. I also have A 106 temperature because I have the flu. I also just had 14 children and I am Jesus Christ.". Went on to say she wished her husband would visit. Also states she feels like staff is "potsy Rotsy". When asked what she meant by that, she replied, "They used to take nude pictures of me and they made a lot of money on me.". Reoriented her to being in a hospital and we are healthcare worker. Continued to smile and began to sing. Will need continued reality orienting until she improves in her thought process. Support and encouragement provided. Denies any acute pain. Denies SI/HI and AVH. Contracts for safety. No real questions or concerns. POC and medications for the shift reviewed, will need reinforcement. Safety has been maintained with Q64min observation. Will continue current POC.

## 2011-05-23 NOTE — Progress Notes (Signed)
Patient ID: [name], female   DOB: 1951-05-24, 60 y.o.   MRN: [id number] The patient has slept very little over the past 24 hours. She remains psychotic and responding to internal stimuli. Labile mood, laughing inappropriately. Speech content is disorganized and difficult to understand. Incontinent of urine. Disheveled with body odor. Assisted with taking a tub bath.

## 2011-05-23 NOTE — Discharge Planning (Signed)
Patient did not attend discharge planning group today. She was in room singing loudly but did not want to participate/  Per State Regulation 482.30 This chart was reviewed for medical necessity with respect to the patient's Admission/Duration of stay. Ardath Lepak L. Casimira Sutphin RN MS EdS  Date: 05/23/2011 Next review Date: 05/25/11

## 2011-05-23 NOTE — Progress Notes (Signed)
[facility] MD Progress Note  05/23/2011 12:20 PM  Diagnosis:  Schizoaffective-Bipolar Type  ADL's:  Incontinent at times during sleep periods  Sleep:  Slept very little this weekend or last night, went to sleep at 7 am today and awoke around 10 am  Appetite:  Pt reports decrease in appetite  Suicidal Ideation:   Plan:  No  Intent:  No  Means:  No  Homicidal Ideation:   Plan:  No  Intent:  No  Means:  No  AEB (as evidenced by):  Mental Status: General Appearance [name]:  Disheveled Eye Contact:  Fair Motor Behavior:  Decreased sleep, racing thoughts, pressured speech Speech:  Pressured Level of Consciousness:  Alert Mood:  Euphoric Affect:  Appropriate, congruent with mood Anxiety Level:  Minimal Thought Process:  Disorganized, tangential Thought Content:  Delusions of [facility] being [facility] and her place of living Perception:  Auditory and visual hallucinations with paranoid thinking  Judgment:  Fair Insight:  Absent Cognition:  Alert and oriented to person,place, and time.  Memory and concentration impaired. Sleep:  Number of Hours: 2.5   Vital Signs:Blood pressure 120/78, pulse 79, temperature 97.4 F (36.3 C), temperature source Oral, resp. rate 18, SpO2 97.00%.  Lab Results:  Results for orders placed during the hospital encounter of 05/17/11 (from the past 48 hour(s))  BASIC METABOLIC PANEL     Status: Abnormal   Collection Time   05/21/11  7:35 PM      Component Value Range Comment   Sodium 137  135 - 145 (mEq/L)    Potassium 4.1  3.5 - 5.1 (mEq/L)    Chloride 104  96 - 112 (mEq/L)    CO2 27  19 - 32 (mEq/L)    Glucose, Bld 93  70 - 99 (mg/dL)    BUN 7  6 - 23 (mg/dL)    Creatinine, Ser 1.61  0.50 - 1.10 (mg/dL)    Calcium 9.4  8.4 - 10.5 (mg/dL)    GFR calc non Af Amer 76 (*) >90 (mL/min)    GFR calc Af Amer 88 (*) >90 (mL/min)     Physical Findings:  Denies physical discomfort   Treatment Plan Summary: Daily contact with patient to assess and evaluate  symptoms and progress in treatment Medication management  Plan: 1.  Evaluate medication regimen 2.  Continue therapy--group and individual  [name] 05/23/2011, 12:20 PM

## 2011-05-24 MED ORDER — PALIPERIDONE ER 6 MG PO TB24
6.0000 mg | ORAL_TABLET | ORAL | Status: DC
  Administered 2011-05-24 – 2011-06-03 (×20): 6 mg via ORAL
  Filled 2011-05-24 (×9): qty 1
  Filled 2011-05-24: qty 2
  Filled 2011-05-24 (×10): qty 1
  Filled 2011-05-24: qty 14
  Filled 2011-05-24: qty 1
  Filled 2011-05-24: qty 14

## 2011-05-24 MED ORDER — FERROUS SULFATE 325 (65 FE) MG PO TABS
325.0000 mg | ORAL_TABLET | Freq: Two times a day (BID) | ORAL | Status: DC
  Administered 2011-05-24 – 2011-05-28 (×9): 325 mg via ORAL
  Administered 2011-05-29: 324 mg via ORAL
  Administered 2011-05-30 – 2011-06-03 (×9): 325 mg via ORAL
  Filled 2011-05-24 (×5): qty 1
  Filled 2011-05-24: qty 14
  Filled 2011-05-24 (×12): qty 1
  Filled 2011-05-24: qty 14
  Filled 2011-05-24 (×5): qty 1

## 2011-05-24 MED ORDER — DIVALPROEX SODIUM ER 500 MG PO TB24
1000.0000 mg | ORAL_TABLET | ORAL | Status: DC
  Administered 2011-05-24 – 2011-06-03 (×20): 1000 mg via ORAL
  Filled 2011-05-24 (×2): qty 2
  Filled 2011-05-24: qty 28
  Filled 2011-05-24 (×4): qty 2
  Filled 2011-05-24: qty 28
  Filled 2011-05-24 (×17): qty 2

## 2011-05-24 NOTE — Progress Notes (Signed)
[facility] Group Notes:  (Counselor/Nursing/MHT/Case Management/Adjunct)  05/24/2011 1:17 PM  Type of Therapy:  9:30AM Group Therapy  Participation Level:  Did Not Attend  Wilmon Arms 05/24/2011, 1:17 PM Veto Kemps, MT-BC 05/24/2011 2:09 PM

## 2011-05-24 NOTE — Progress Notes (Signed)
Pt was up in dayroom upon first assessment.  She was given her self-inventory which she did not fill out on her own.  She did answer most of the questions when asked and rated both her depression and hopelessness a 10 today.  She denied any A/V hallucinations but appears to be responding to internal stimuli.  She is calling herself Jesus Christ again today but will stated she is at [employer] when asked if she knows where she is.  She has been med compliant thus far.   She did sleep a little while this morning but was back up in dayroom by lunch.  She has not voiced any complaints thus far.

## 2011-05-24 NOTE — Progress Notes (Signed)
[facility] MD Progress Note  05/24/2011 2:39 PM  Diagnosis:  Axis I: Schizoaffective Disorder - Bipolar Type.   The patient was seen today and reports the following:   ADL's: Remains Impaired.  Sleep: The patient reports to sleeping well last night but according to staff she did not sleep. Appetite: The patient reports a decreased appetite.   Mild>(1-10) >Severe  Hopelessness (1-10): 0  Depression (1-10): 0  Anxiety (1-10): 0   Suicidal Ideation: The patient denies any suicidal ideations today.  Plan: No  Intent: No  Means: No   Homicidal Ideation: The patient denies any homicidal ideations today.  Plan: No  Intent: No.  Means: No   General Appearance /Behavior: Dishevel and unkempt.  Eye Contact: Fair.  Speech: Slightly Pressured.  Motor Behavior: Moderately slowed.  Level of Consciousness: Alert  Mental Status: Alert and Oriented x 3.  Mood: Depressed - Mildly Manic.  Affect: Flat.  Anxiety Level: Mildly anxious.  Thought Process: Fragmented.  Thought Content: The patient reports ongoing paranoid thinking as well as auditory hallucinations. No visual hallucinations noted or reported. Today she states she would like to join [employer] and fly planes. Perception: Poor.  Judgment: Poor.  Insight: Poor.  Cognition: Impaired  Sleep:  Number of Hours: 0   Vital Signs:Blood pressure 123/86, pulse 105, temperature 97.8 F (36.6 C), temperature source Oral, resp. rate 16, SpO2 97.00%.  Lab Results: No results found for this or any previous visit (from the past 48 hour(s)).  Treatment Plan Summary:  1. Daily contact with patient to assess and evaluate symptoms and progress in treatment  2. Medication management  3. The patient will deny suicidal ideations or homicidal ideations for 48 hours prior to discharge and have a depression and anxiety rating of 3 or less. The patient will also deny any auditory or visual hallucinations or delusional thinking and display no manic or  hypomanic behaviors.   Plan:  1. Will increase [name] to 6 mgs po BID to address her psychosis. 2. Will increase Depakote ER to 1000 mgs po BID to provide mood stabilization. 3. Will start Iron 325 mgs po BID for iron deficiency anemia. 4. Will recheck serum depakote, CMP, TSH, Free T3 and T4 and CBC with diff Wednesday night. 5 Continue to monitor.   Gatsby Chismar 05/24/2011, 2:39 PM

## 2011-05-24 NOTE — Progress Notes (Signed)
Per State Regulation 482.30  This chart was reviewed for medical necessity with respect to the patient's Admission/Duration of stay.   Next review due:  05/27/11   Ambrose Mantle, LCSW  05/24/2011  12:54 PM

## 2011-05-24 NOTE — Progress Notes (Signed)
Pt lying in bed with eyes open on approach. Calm and cooperative with assessment. Continues to be delusional, but mood appears to be more stable. No acute distress. When asked how her day was, responded by saying she has felt tired all day. 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 Continues to state she wished her husband would call. Support and encourageent provided. Encouraged to to discuss this concern with CM and maybe they could arrange for a phone call or visit. Otherwise, no questions or concerns. Denies any acute pain. Denies SI/HI and AVH. Contracts for safety. No real questions or concerns. POC and medications for the shift reviewed, will need reinforcement. Safety has been maintained with Q35min observation. Will continue current POC.

## 2011-05-24 NOTE — Tx Team (Signed)
Interdisciplinary Treatment Plan Update (Adult)  Date:  05/24/2011  Time Reviewed:  10:15 AM   Progress in Treatment: Attending groups:  No Participating in groups:    No Taking medication as prescribed:    Yes Tolerating medication:   Yes Family/Significant other contact made:  Yes, POA Patient understands diagnosis:   Yes, limited understanding Discussing patient identified problems/goals with staff:   Yes Medical problems stabilized or resolved:   Is incontinent of urine Denies suicidal/homicidal ideation:  Yes Issues/concerns per patient self-inventory:   None Other:  Not sleeping  New problem(s) identified: Yes, Describe:  Not sleeping  Reason for Continuation of Hospitalization: Delusions  Hallucinations Medication stabilization Other; describe Not sleeping  Interventions implemented related to continuation of hospitalization:  Medication monitoring and adjustment, safety checks Q15 min., group therapy, psychoeducation, collateral contact  Additional comments:  Not applicable  Estimated length of stay:  5-7 days  Discharge Plan:  Return to apartment, follow up with [facility]  New goal(s):  Return to normal sleep pattern, 5-7 hours nightly  Review of initial/current patient goals per problem list:   1.  Goal(s):  Reduce psychosis to baseline  Met:  No  Target date:  By Discharge   As evidenced by:  In room, singing to self constantly, delusional  2.  Goal(s):  Improve hygiene to healthy, manageable level  Met:  No  Target date:  By Discharge   As evidenced by:   Needs improvement     Attendees: Patient:  Did not attend 05/24/2011  10:15 AM   Family:     Physician:  Dr. Harvie Heck Readling 05/24/2011  10:15 AM   Nursing:   Robbie Louis, RN 05/24/2011  10:15 AM   Case Manager:  Ambrose Mantle, LCSW 05/24/2011  10:15 AM   Counselor:  Veto Kemps, MT-BC 05/24/2011  10:15 AM   Other:   Izola Price, RN 05/24/11  10:15AM  Other:     Other:       Other:      Scribe for Treatment Team:   Sarina Ser, 05/24/2011, 10:15 AM

## 2011-05-25 LAB — CBC
Hemoglobin: 10 g/dL — ABNORMAL LOW (ref 12.0–15.0)
MCH: 31.5 pg (ref 26.0–34.0)
MCHC: 32.3 g/dL (ref 30.0–36.0)

## 2011-05-25 LAB — COMPREHENSIVE METABOLIC PANEL
ALT: 6 U/L (ref 0–35)
AST: 8 U/L (ref 0–37)
Alkaline Phosphatase: 51 U/L (ref 39–117)
CO2: 29 mEq/L (ref 19–32)
Calcium: 9.6 mg/dL (ref 8.4–10.5)
Chloride: 102 mEq/L (ref 96–112)
GFR calc non Af Amer: 66 mL/min — ABNORMAL LOW (ref >90)
Potassium: 3.7 mEq/L (ref 3.5–5.1)
Sodium: 137 mEq/L (ref 135–145)
Total Bilirubin: 0.2 mg/dL — ABNORMAL LOW (ref 0.3–1.2)

## 2011-05-25 LAB — DIFFERENTIAL
Basophils Relative: 1 % (ref 0–1)
Eosinophils Absolute: 0.1 10*3/uL (ref 0.0–0.7)
Monocytes Relative: 9 % (ref 3–12)
Neutrophils Relative %: 50 % (ref 43–77)

## 2011-05-25 NOTE — Progress Notes (Signed)
[facility] MD Progress Note  05/25/2011 3:15 PM  Diagnosis:  Axis I: Schizoaffective Disorder - Bipolar Type.   The patient was seen today and reports the following:   ADL's: Remains Impaired.  Sleep: The patient reports to sleeping well last night but according to staff she again did not sleep.  Appetite: The patient reports a decreased appetite.   Mild>(1-10) >Severe  Hopelessness (1-10): 0  Depression (1-10): 0  Anxiety (1-10): 0   Suicidal Ideation: The patient denies any suicidal ideations today.  Plan: No  Intent: No  Means: No   Homicidal Ideation: The patient denies any homicidal ideations today.  Plan: No  Intent: No.  Means: No   General Appearance /Behavior: Dishevel and unkempt.  Eye Contact: Fair.  Speech: Slightly Pressured.  Motor Behavior: Moderately slowed.  Level of Consciousness: Alert  Mental Status: Alert and Oriented x 3.  Mood: Depressed - Mildly Manic.  Affect: Bright today. Anxiety Level: Mildly anxious.  Thought Process: Fragmented.  Thought Content: The patient reports ongoing paranoid thinking as well as auditory hallucinations. No visual hallucinations noted or reported.  Perception: Poor.  Judgment: Poor.  Insight: Poor.  Cognition: Impaired   Sleep:  Number of Hours: 0.25   Vital Signs:Blood pressure 100/66, pulse 71, temperature 98.1 F (36.7 C), temperature source Oral, resp. rate 20, SpO2 97.00%.  Lab Results: No results found for this or any previous visit (from the past 48 hour(s)).  Treatment Plan Summary:  1. Daily contact with patient to assess and evaluate symptoms and progress in treatment  2. Medication management  3. The patient will deny suicidal ideations or homicidal ideations for 48 hours prior to discharge and have a depression and anxiety rating of 3 or less. The patient will also deny any auditory or visual hallucinations or delusional thinking and display no manic or hypomanic behaviors.   Plan:  1. Will continue  current medications today. 2. Labs will be obtained tonight. 3. Continue to monitor.  Aidan Caloca 05/25/2011, 3:15 PM

## 2011-05-25 NOTE — Progress Notes (Signed)
Pt was in bed asleep upon first assessment. She did wake to take her am medications but she went right back to sleep.  She did not eat any breakfast.  She slept until lunch and she woke and took her noon medication and ate lunch in her room.  She stayed up for awhile.   Changed her bed linens and gave her clean gowns to put on after her bath.  She did request a tub bath so we had to wait until it was available.  She took her bath around 1830 tonight with minimal assistance.  She is alert and oriented.  At first, she stated her name as some famous person but when pressed she stated, "my name is [name] and I know I am at [facility]"  When asked the day she stated,"Thursday,  May 25, 2011" corrected her on the day but praised her for knowing the date, month and year. She is still laughing and talking loudly at times but can be redirected a bit better now.   She remains delusional at times talking about being the "head" of her tribe and all the medicinal things that she is personally responsible for either making or assisting the "other chief" in making the potions.  She had no new orders today.  She was able to answer the questions on her self-inventory with this nurse writing the answers.  She denied any depression or hopelessness today but did admit to feeling anxious at times.  She has not voiced any complaints today and was not up for any groups or activities.

## 2011-05-25 NOTE — Progress Notes (Signed)
Met pt in the dayroom, sitting with other patients on the unit.  She had attended evening group.  She was upset that she had been given popcorn for a snack, and said other patients were eating a different snack.  There were other patients eating popcorn, but further investigation revealed that pt has no teeth and eating popcorn is difficult for her.  Offered pt something soft like pudding.  At first she was hostile and refused.  She got up and went to her room.  This Clinical research associate got her some pudding and took it to her room.  She accepted it and was then in a better mood.  She has no other complaints.  She was also observed talking to herself as if someone was in the room with her.  Pt took her evening meds without incident.  Safety maintained with q15 minute checks.

## 2011-05-25 NOTE — Discharge Planning (Signed)
Met with patient in her room, where she was smiling, laughing and talking to an unseen person.  She stated she wanted [place] to get in touch with her Power of Attorney, and when Case Manager reported to her that this had already been done, she was pleased.  She had no other Case Management requests.  As Clinical research associate left the room, patient started singing loudly.  Ambrose Mantle, LCSW 05/25/2011, 3:40 PM

## 2011-05-26 LAB — T3, FREE: T3, Free: 2.5 pg/mL (ref 2.3–4.2)

## 2011-05-26 NOTE — Progress Notes (Addendum)
[facility] MD Progress Note  05/26/2011 11:23 AM  Diagnosis:  Schizoaffective--Bipolar Type  ADL's:  Reminders and prompts needed  Sleep:  Awake all evening, slept from 0945 to 1100  Appetite:  Pt reports no decrease in appetite and was requesting a snack (Goldfish crackers and ice water retrieved for pt.  Suicidal Ideation:   Plan:  No  Intent:  No  [name]:  No  Homicidal Ideation:   Plan:  No  Intent:  No  [name]:  No  Mental Status: General Appearance /Behavior:  Disheveled Eye Contact:  Good Motor Behavior:  Normal Speech:  Normal Level of Consciousness:  Alert Mood:  Euphoric Affect:  Appropriate Anxiety Level:  None Thought Process:  Coherent Thought Content:  Talks frequently about her mother and father Perception:  Hallucinations Judgment:  Fair Insight:  Absent Cognition:  Alert and oriented x 4, no issues with concentration  Sleep:  45 minutes  Vital Signs:Blood pressure 124/85, pulse 84, temperature 97.7 F (36.5 C), temperature source Oral, resp. rate 20, SpO2 97.00%.  Lab Results:  Results for orders placed during the hospital encounter of 05/17/11 (from the past 48 hour(s))  COMPREHENSIVE METABOLIC PANEL     Status: Abnormal   Collection Time   05/25/11  8:00 PM      Component Value Range Comment   Sodium 137  135 - 145 (mEq/L)    Potassium 3.7  3.5 - 5.1 (mEq/L)    Chloride 102  96 - 112 (mEq/L)    CO2 29  19 - 32 (mEq/L)    Glucose, Bld 99  70 - 99 (mg/dL)    BUN 13  6 - 23 (mg/dL)    Creatinine, Ser 4.09  0.50 - 1.10 (mg/dL)    Calcium 9.6  8.4 - 10.5 (mg/dL)    Total Protein 7.1  6.0 - 8.3 (g/dL)    Albumin 3.2 (*) 3.5 - 5.2 (g/dL)    AST 8  0 - 37 (U/L)    ALT 6  0 - 35 (U/L)    Alkaline Phosphatase 51  39 - 117 (U/L)    Total Bilirubin 0.2 (*) 0.3 - 1.2 (mg/dL)    GFR calc non Af Amer 66 (*) >90 (mL/min)    GFR calc Af Amer 77 (*) >90 (mL/min)   CBC     Status: Abnormal   Collection Time   05/25/11  8:00 PM      Component Value Range  Comment   WBC 6.1  4.0 - 10.5 (K/uL)    RBC 3.17 (*) 3.87 - 5.11 (MIL/uL)    Hemoglobin 10.0 (*) 12.0 - 15.0 (g/dL)    HCT 81.1 (*) 91.4 - 46.0 (%)    MCV 97.8  78.0 - 100.0 (fL)    MCH 31.5  26.0 - 34.0 (pg)    MCHC 32.3  30.0 - 36.0 (g/dL)    RDW 78.2  95.6 - 21.3 (%)    Platelets 288  150 - 400 (K/uL)   DIFFERENTIAL     Status: Normal   Collection Time   05/25/11  8:00 PM      Component Value Range Comment   Neutrophils Relative 50  43 - 77 (%)    Neutro Abs 3.1  1.7 - 7.7 (K/uL)    Lymphocytes Relative 39  12 - 46 (%)    Lymphs Abs 2.4  0.7 - 4.0 (K/uL)    Monocytes Relative 9  3 - 12 (%)    Monocytes Absolute 0.5  0.1 - 1.0 (K/uL)    Eosinophils Relative 1  0 - 5 (%)    Eosinophils Absolute 0.1  0.0 - 0.7 (K/uL)    Basophils Relative 1  0 - 1 (%)    Basophils Absolute 0.0  0.0 - 0.1 (K/uL)   VALPROIC ACID LEVEL     Status: Normal   Collection Time   05/25/11  8:00 PM      Component Value Range Comment   Valproic Acid Lvl 77.9  50.0 - 100.0 (ug/mL)   T3, FREE     Status: Normal   Collection Time   05/25/11  8:00 PM      Component Value Range Comment   T3, Free 2.5  2.3 - 4.2 (pg/mL)   T4, FREE     Status: Normal   Collection Time   05/25/11  8:00 PM      Component Value Range Comment   Free T4 1.38  0.80 - 1.80 (ng/dL)   TSH     Status: Abnormal   Collection Time   05/25/11  8:00 PM      Component Value Range Comment   TSH 4.678 (*) 0.350 - 4.500 (uIU/mL)     Physical Findings:  Pt denies pain or discomfort  Treatment Plan Summary:  Daily contact with patient to assess and evaluate symptoms and progress in treatment * Medication management * The patient will deny suicidal ideations or homicidal ideations for 48 hours prior to discharge and have a depression and anxiety rating of 3 or less. The patient will also deny any auditory or visual hallucinations or delusional thinking and display no manic or hypomanic behaviors.   Plan:  1. Will continue current  medications today.  2. Continue to monitor.  [name] 05/26/2011, 11:23 AM

## 2011-05-26 NOTE — Progress Notes (Addendum)
Pt was up in wheelchair in hallway upon first assessment.  She was yelling and cursing about not being allowed enough time to eat in the cafeteria.  She was demanding that someone push her to her room.  We did talk a while while getting her medications together and by the time she took them all she was calmer and cooperative.  She has remained in her room since that time.  Just took her noon protonix and she is lying awake in bed talking to herself.  She is having her lunch brought back on the unit to ensure that she has enough time to eat.  She did smile and thanked this nurse for allowing her to stay back on the unit.  Treatment team discussed pt and questioned her baseline and how we will know if she is ready for discharge.  It was decided that when she is able to care for her adl's without prompting then she would be closer to time to go home.  Pt is not asking for discharge at this point and is still unable to really participate in groups.  She remains labile and delusional.  She did answer her questions on her self-inventory and rated both her depression and hopelessness a 10 today.  She denied any A/V hallucinations but she is constantly talking with herself and laughing inappropriately.

## 2011-05-27 NOTE — Progress Notes (Addendum)
Pt was seen initially lying in bed awake. Pt was greeted and she greeted me back approprietly as she reported my name. Pt often smiled and laughed inappropriately during our conversation. Upon entering the room she was talking to herself, but could be easily redirected. At times she kept referencing that her husband was coming to bring her some clothes, and minutes later she stated that her husband died recently. During med pass, pt stated that she peed on herself. Pt perineal area was cleaned as well as her feet as additionally requested by the patient. Pt's bed linen was changed and 2 new gowns were placed on the patient. Pt was complaint with her evening meds. Pt safety remains with q50min checks.

## 2011-05-27 NOTE — Discharge Planning (Signed)
Patient attended Aftercare Planning Group for first time since she first got to [facility].  She smiled throughout, and said the only case management need she had today was for [place] to help her get married to her lifelong sweetheart.  When Case Manager told her this was probably not something that could happen until she left the hospital, she left and said "I know, I was just teasing."  She remains in agreement with plan to have [facility] work with her at D/C.  Per State Regulation 482.30  This chart was reviewed for medical necessity with respect to the patient's Admission/Duration of stay.   Next review due:  05/30/11   Ambrose Mantle, LCSW  05/27/2011  11:32 AM

## 2011-05-27 NOTE — Progress Notes (Signed)
[facility] MD Progress Note  05/27/2011 5:42 PM   Diagnosis:  Axis I: Schizoaffective Disorder - Bipolar Type.   The patient was seen today and reports the following:   ADL's: Remains Impaired but improving.  Sleep: The patient reports to sleeping much better last night.  Appetite: The patient reports a good appetite today.   Mild>(1-10) >Severe  Hopelessness (1-10): 0  Depression (1-10): 0  Anxiety (1-10): 0   Suicidal Ideation: The patient denies any suicidal ideations today.  Plan: No  Intent: No  Means: No   Homicidal Ideation: The patient denies any homicidal ideations today.  Plan: No  Intent: No.  Means: No   General Appearance /Behavior: Somewhat unkempt. Eye Contact: Good.  Speech: Slightly Pressured.  Motor Behavior: Slightly slowed.  Level of Consciousness: Alert  Mental Status: Alert and Oriented x 3.  Mood: Depressed - Moderately Manic.  Affect: Bright today.  Anxiety Level: Mildly anxious.  Thought Process: Fragmented.  Thought Content: The patient denied any auditory hallucinations today but was found in her room by this Physician having a full conversation between herself and her boyfriend.  However the boyfriend was not on the unit.  Perception: Poor.  Judgment: Poor.  Insight: Poor.  Cognition: Impaired  Sleep:  Number of Hours: 5   Vital Signs:Blood pressure 138/107, pulse 88, temperature 98.2 F (36.8 C), temperature source Oral, resp. rate 18, SpO2 97.00%.  Lab Results:  Results for orders placed during the hospital encounter of 05/17/11 (from the past 48 hour(s))  COMPREHENSIVE METABOLIC PANEL     Status: Abnormal   Collection Time   05/25/11  8:00 PM      Component Value Range Comment   Sodium 137  135 - 145 (mEq/L)    Potassium 3.7  3.5 - 5.1 (mEq/L)    Chloride 102  96 - 112 (mEq/L)    CO2 29  19 - 32 (mEq/L)    Glucose, Bld 99  70 - 99 (mg/dL)    BUN 13  6 - 23 (mg/dL)    Creatinine, Ser 4.09  0.50 - 1.10 (mg/dL)    Calcium 9.6  8.4 -  10.5 (mg/dL)    Total Protein 7.1  6.0 - 8.3 (g/dL)    Albumin 3.2 (*) 3.5 - 5.2 (g/dL)    AST 8  0 - 37 (U/L)    ALT 6  0 - 35 (U/L)    Alkaline Phosphatase 51  39 - 117 (U/L)    Total Bilirubin 0.2 (*) 0.3 - 1.2 (mg/dL)    GFR calc non Af Amer 66 (*) >90 (mL/min)    GFR calc Af Amer 77 (*) >90 (mL/min)   CBC     Status: Abnormal   Collection Time   05/25/11  8:00 PM      Component Value Range Comment   WBC 6.1  4.0 - 10.5 (K/uL)    RBC 3.17 (*) 3.87 - 5.11 (MIL/uL)    Hemoglobin 10.0 (*) 12.0 - 15.0 (g/dL)    HCT 81.1 (*) 91.4 - 46.0 (%)    MCV 97.8  78.0 - 100.0 (fL)    MCH 31.5  26.0 - 34.0 (pg)    MCHC 32.3  30.0 - 36.0 (g/dL)    RDW 78.2  95.6 - 21.3 (%)    Platelets 288  150 - 400 (K/uL)   DIFFERENTIAL     Status: Normal   Collection Time   05/25/11  8:00 PM      Component Value  Range Comment   Neutrophils Relative 50  43 - 77 (%)    Neutro Abs 3.1  1.7 - 7.7 (K/uL)    Lymphocytes Relative 39  12 - 46 (%)    Lymphs Abs 2.4  0.7 - 4.0 (K/uL)    Monocytes Relative 9  3 - 12 (%)    Monocytes Absolute 0.5  0.1 - 1.0 (K/uL)    Eosinophils Relative 1  0 - 5 (%)    Eosinophils Absolute 0.1  0.0 - 0.7 (K/uL)    Basophils Relative 1  0 - 1 (%)    Basophils Absolute 0.0  0.0 - 0.1 (K/uL)   VALPROIC ACID LEVEL     Status: Normal   Collection Time   05/25/11  8:00 PM      Component Value Range Comment   Valproic Acid Lvl 77.9  50.0 - 100.0 (ug/mL)   T3, FREE     Status: Normal   Collection Time   05/25/11  8:00 PM      Component Value Range Comment   T3, Free 2.5  2.3 - 4.2 (pg/mL)   T4, FREE     Status: Normal   Collection Time   05/25/11  8:00 PM      Component Value Range Comment   Free T4 1.38  0.80 - 1.80 (ng/dL)   TSH     Status: Abnormal   Collection Time   05/25/11  8:00 PM      Component Value Range Comment   TSH 4.678 (*) 0.350 - 4.500 (uIU/mL)    Treatment Plan Summary:  1. Daily contact with patient to assess and evaluate symptoms and progress in treatment    2. Medication management  3. The patient will deny suicidal ideations or homicidal ideations for 48 hours prior to discharge and have a depression and anxiety rating of 3 or less. The patient will also deny any auditory or visual hallucinations or delusional thinking and display no manic or hypomanic behaviors.   Plan:  1. Will continue current medications today.  2. Continue to monitor.   [name] 05/27/2011, 5:42 PM

## 2011-05-27 NOTE — Progress Notes (Signed)
Patient ID: [name], female   DOB: Jul 27, 1950, 60 y.o.   MRN: [id number] 05-27-11 pt has enjoyed being waited on in her room. RN continued to encourage her to walk and go to the cafeteria for meals.  Pt's Depakote level is at 77.9. On her inventory sheet she wrote sleep well, appetite good, energy high, attention good, depression she wrote good. She didn't address w/d symptoms, but RN doesn't think that is an issue with this patient. Denied any suicide ideation. She didn't complete the rest of the sheet. RN will monitor and safety checks continue every 15 minutes.

## 2011-05-27 NOTE — Progress Notes (Signed)
[facility] Group Notes:  (Counselor/Nursing/MHT/Case Management/Adjunct)  05/27/2011 1:17 PM  Type of Therapy:  Group therapy  Participation Level:  Minimal  Participation Quality:  Talking to herself, disruptive  Affect:  Flat  Cognitive:  Delusional  Insight:  None  Engagement in Group:  Limited  Engagement in Therapy:  Limited  Modes of Intervention:  Orientation and Support  Summary of Progress/Problems: Patient was welcomed to the group since she has not attended the last couple of days. She kept talking to herself and was eventually asked by her peer to stop talking. Patient continues to be delusional about being murdered a few years ago, being in the hospital as a staff member, etc.  Veto Kemps 05/27/2011, 1:17 PM

## 2011-05-28 NOTE — Progress Notes (Signed)
Patient ID: [name], female   DOB: 1950/08/31, 60 y.o.   MRN: [id number] Pt. Anxious, up in chair rocking, singing, talking to self and looking out the window. Writer administered med for anxiety (see Mar)  Staff will continue to monitor q39min for safety.

## 2011-05-28 NOTE — Progress Notes (Signed)
Patient ID: [name], female   DOB: Oct 12, 1950, 60 y.o.   MRN: [id number] 05-28-11  Nursing shift note: pt has been very labile this am. she threw her breakfast across the room  this am. She was argumentative and disagreeable. Later in the am she calmed down. She was incontinent of urine this am x 2. mht's bathed the pt. On her inventory sheet she stated she slept well, appetite good, energy high and attention good. She stated depreesion and hopelessness were good. No withdrawal symptoms and denied suicide ideation. No physical problems. Everything else was illegible on the inventory sheet. She did tell staff she "wanted to take them to the beach" after discharge. She stated she knew her aftercare plan after discharge. She stated she didn't see any problems staying on medications after discharge. RN will continue to monitor and safety checks continue every 15 minutes.

## 2011-05-28 NOTE — Progress Notes (Signed)
Pt. Lying in the bed but easily arousable. Asked nurse to phone Officer Jena Gauss and have him go to her apartment where she was killed. Informed pt. She is alive and then pt. Stated," well I did die once and now and now am back." Pt. Has a very disheveled appearance but is very cooperative. Will continue to moniotr. At imes slow to respond to questions and is alert to person and place only. Stated the year was 2014.

## 2011-05-28 NOTE — Progress Notes (Signed)
Pt. Started laughing after taking her meds and stated her brother in law was in love with her and she wanted to call him. Then pt started to ramble about other things that did not make any sense.

## 2011-05-28 NOTE — Progress Notes (Signed)
[facility] MD Progress Note  05/28/2011 5:17 PM  Diagnosis:   Axis I: Schizoaffective Disorder Axis II: Deferred Axis III:  Past Medical History  Diagnosis Date  . Schizoaffective disorder   . Urinary tract infection   . Hypertension   . Atrial fibrillation   . GERD (gastroesophageal reflux disease)   . Hypothyroidism   . Myocardial infarction   . Diabetes mellitus   . Obesity    Axis IV: No changes Axis V: 21-30 behavior considerably influenced by delusions or hallucinations OR serious impairment in judgment, communication OR inability to function in almost all areas  ADL's:  Impaired  Sleep:  No  Appetite:  Yes,  AEB:  Suicidal Ideation:   Plan:  No  Intent:  No  Means:  No  Homicidal Ideation:   Plan:  No  Intent:  No  Means:  No    Mental Status: General Appearance /Behavior:  Casual Eye Contact:  Good Motor Behavior:  Normal Speech:  Pressured Level of Consciousness:  Alert Mood:  Euthymic Affect:  Appropriate Anxiety Level:  Minimal Thought Process:  Disorganized Thought Content:  Delusions Perception:  Hallucinations Judgment:  Poor Insight:  Absent Cognition:  Orientation place and person Sleep:  Number of Hours: 1.5   Vital Signs:Blood pressure 109/69, pulse 80, temperature 97.9 F (36.6 C), temperature source Oral, resp. rate 20, SpO2 99.00%.  Lab Results: No results found for this or any previous visit (from the past 48 hour(s)).      Treatment Plan Summary: Daily contact with patient to assess and evaluate symptoms and progress in treatment Medication management  Plan:Will on current treatment plan. Continue Q 15 minutes checks for safety.  Armandina Stammer I 05/28/2011, 5:17 PM

## 2011-05-28 NOTE — Progress Notes (Signed)
[facility] Group Notes:  (Counselor/Nursing/MHT/Case Management/Adjunct)  05/28/2011 3:57 PM  Pt. attended after care planning group and was given [place] [facility] information along with crisis and hotline numbers. Pt. agreed to use them if needed. The pt. asked about whether she could make a phone call  And was told she would have to talk with her Mental health Tech on her hallway. Pt. Spoke about seeing a doctor but was unable to be understood by the therapist due to the pt. Mumbling while she was talking.   Lamar Blinks [place] 05/28/2011, 3:57 PM

## 2011-05-28 NOTE — Progress Notes (Signed)
Patient ID: [name], female   DOB: 06-Aug-1950, 60 y.o.   MRN: [id number]   [facility] Group Notes:  (Counselor/Nursing/MHT/Case Management/Adjunct)  05/28/2011 11 AM  Type of Therapy:  Group Therapy, Dance/Movement Therapy   Participation Level:  Active  Participation Quality:  Attentive and Redirectable  Affect:  Appropriate  Cognitive:  Confused, Delusional and Hallucinating  Insight:  Limited  Engagement in Group:  Good  Engagement in Therapy:  Good  Modes of Intervention:  Clarification, Problem-solving, Role-play, Socialization and Support  Summary of Progress/Problems: Pt shared a experience of personal success when she finished high school and went to college. Pt then practiced embodying this positive feeling and remembering the accomplishment. Pt developed a plan to embody this more so as to feel more accomplished and increase her personal feelings of well being now at [name] and upon D/C. Throughout group, pt was easily distracted by other pts, and would verbalize and engage with hallucinations. Pt would be gently redirected and was responsive.    Thomasena Edis, [employer]

## 2011-05-29 NOTE — Progress Notes (Signed)
Patient ID: [name], female   DOB: 1950/08/20, 60 y.o.   MRN: [id number]   [facility] Group Notes:  (Counselor/Nursing/MHT/Case Management/Adjunct)  05/29/2011 11 AM  Type of Therapy:  Group Therapy, Dance/Movement Therapy   Participation Level:  Did Not Attend  Kym Groom

## 2011-05-29 NOTE — Progress Notes (Signed)
[facility] Group Notes:  (Counselor/Nursing/MHT/Case Management/Adjunct)  05/29/2011 10:39 AM  Pt. attended after care planning group  and was given [place] Suicide Prevention Information, crisis and hotline numbers, [facility] information, and information on Therapeutic alternatives located in [place]. Pt.'s in group were encouraged to seek supports in groups, agencies , and others when being discharged from group. Pt. Was talking, but was about random things. She stated her stomach was hurting. Pt. is psychotic and unable to understand  when she was  talking. Pt was happy and smiling during the group. Pt. Also showed signs of paranoia.  [name] [place] 05/29/2011, 10:39 AM

## 2011-05-29 NOTE — Progress Notes (Addendum)
Patient ID: [name], female   DOB: [birth year], 60 y.o.   MRN: [id number] 05/29/2011 Nrsg D  Tracia's condition and status are basically the same. Immediately after breakfast this morning ( 0730 )  she was assisted to the handicapped bathroom  And was given a complete bath ( she was incontinent of large amounts of urine). Afterwards, she put on clean clothes and has stayed in her room..either in her bed or in her wheel chair. She ws encouraged to complete her self inventory this AM and chose not to do this, despite encouragement from staff. A She was compliant with her meds this morning and ate her breakfast, as well. She has not attended and / or participated in any groups today. Her speech rambles and her words are not articulated well....she slurrs one  word into the next. This evening ( before dinner) she was observed sitting in group room.influenza a chair.Marland Kitchenleaned up against the wall. She was moaning and groaning and this nurse went to her ,,talking to her and assessing her , trying to get her vital signs and then pt threw herself onto the floor. RN attempted to obtain pt's vital signs and pt opened her eyes and said " I want some coffee". MHT and RN instructed pt to get up..off the floor and she did this. R Safety is maintained and POC includes maintaining therapeutic relationship already established as well as setting and keeping boundaries. PD RN Marsa Aris

## 2011-05-29 NOTE — Progress Notes (Signed)
Patient ID: [name], female   DOB: July 26, 1950, 60 y.o.   MRN: [id number] Pt. In dayroom laughing and singing with other clients. Denies SHI,  Pleasant talks about her son and his dad. Staff will continue to monitor q46min for safety.

## 2011-05-29 NOTE — Progress Notes (Signed)
Patient ID: [name], female   DOB: 1950-10-07, 60 y.o.   MRN: [id number] Pt. Talking loud, laughing inappropriately. Writer reviewed meds and adminstered Ativan 1mg  po for anxiety. (see MAR) Staff will continue to monitor q22min for safety.

## 2011-05-29 NOTE — Progress Notes (Signed)
[facility] MD Progress Note  05/29/2011 12:02 PM  Diagnosis:   Axis I: Schizoaffective Disorder, bipolar type Axis II: Deferred Axis III:  Past Medical History  Diagnosis Date  . Schizoaffective disorder   . Urinary tract infection   . Hypertension   . Atrial fibrillation   . GERD (gastroesophageal reflux disease)   . Hypothyroidism   . Myocardial infarction   . Diabetes mellitus   . Obesity    Axis IV: No changes Axis V: 21-30 behavior considerably influenced by delusions or hallucinations OR serious impairment in judgment, communication OR inability to function in almost all areas  ADL's:  Impaired  Sleep:  Poor, 2.5 hours per documentation.  Appetite:  Yes  Suicidal Ideation:   Plan:  No  Intent:  No  Means:  No  Homicidal Ideation:   Plan:  No  Intent:  No  Means:  No   Mental Status: General Appearance /Behavior:  Casual Eye Contact:  Good Motor Behavior:  Normal Speech:  Normal Level of Consciousness:  Alert Mood:  Euphoric Affect:  Appropriate Anxiety Level:  Minimal Thought Process:  Disorganized Thought Content:  Delusions Perception:  Illusions Judgment:  Poor Insight:  Absent Cognition:  Orientation time, place and person Sleep:  Number of Hours: 2.5   Vital Signs:Blood pressure 108/72, pulse 93, temperature 98.2 F (36.8 C), temperature source Oral, resp. rate 18, SpO2 99.00%.  Lab Results: No results found for this or any previous visit (from the past 48 hour(s)).    Treatment Plan Summary: Daily contact with patient to assess and evaluate symptoms and progress in treatment Medication management  Plan: Will continue patient on current treatment plan.           Continue Q 15 minutes checks to assure safety.  Armandina Stammer I 05/29/2011, 12:02 PM

## 2011-05-30 NOTE — Progress Notes (Signed)
Pt has been up for most of the groups today.  She has been more appropriate today.  She is still grandiose talking about her education she stated,"I guess you can call me a genius"  She is wanting to go home today and knows that her fiance/boyfriend/husband will have to be notified to see if she is "back to baseline"  She did tell the treatment team this morning that she was not depressed, hopeless, suicidal, homicidal or hearing or seeing things.  She said that she had been off her medications for a long time and [name] had begged her to start back on medications and she wouldn't.  She now says she will stay on her medications for she realizes she really needs them.  She is still loud and disruptive at times but can be redirected easily now than before.  She had no new orders today.  Possible discharge tomorrow if everything checks out.

## 2011-05-30 NOTE — Discharge Planning (Signed)
Met with patient in Aftercare Planning Group and in Treatment Team.  See Plan Update for more details.  Anticipate D/C tomorrow, so called [facility] and left a message for intake worker asking for specific appointment for patient to be seen in order to start services with the ACTT.  Patient is well aware of this plan, and is supportive of it.  Per State Regulation 482.30  This chart was reviewed for medical necessity with respect to the patient's Admission/Duration of stay.   Next review due:  06/02/11   Ambrose Mantle, LCSW  05/30/2011  12:34 PM  .

## 2011-05-30 NOTE — Tx Team (Signed)
Interdisciplinary Treatment Plan Update (Adult)  Date:  05/30/2011  Time Reviewed:  10:13 AM   Progress in Treatment: Attending groups:  Yes Participating in groups:    Yes, is intrusive but can be redirected Taking medication as prescribed:    Yes, no refusals Tolerating medication:   Yes, no side effects reported by patient or noted by staff Family/Significant other contact made:  Yes, with POA Patient understands diagnosis:   Yes, limited Discussing patient identified problems/goals with staff:   Yes Medical problems stabilized or resolved:   Yes Denies suicidal/homicidal ideation:  Yes Issues/concerns per patient self-inventory:   None Other:  New problem(s) identified: Yes, Describe:  we need collateral contact with patient's POA/friend/fiance [name] about coming to pick her up, does he think she has returned to baseline.  Reason for Continuation of Hospitalization: Other; describe Need contact with collateral to assure that even though she is still somewhat delusional, that she is manageable in the community  Interventions implemented related to continuation of hospitalization:  Medication monitoring and adjustment, safety checks Q15 min., group therapy, psychoeducation, collateral contact  Additional comments:  "I've learned something since I've been here.  I was off my medications for a long time.  My fiancee tried to get me to take them, and I wouldn't, so he had to admit me here.  I'm taking meds since I've been here, and I feel better, and I'm going to stay on them."  Estimated length of stay:  1-2 days  Discharge Plan:  Return to apartment, follow up with [facility].  New goal(s):  Not applicable  Review of initial/current patient goals per problem list:   1.  Goal(s):  Return psychosis to baseline.  Met:  Yes  Target date:  By Discharge   As evidenced by:  Still some delusions, but this appears to be her baseline  2.  Goal(s):  Imiprove hygiene to  healthy, manageable level.  Met:  Yes  Target date:  By Discharge   As evidenced by:  Is attending to all ADLs  3.  Goal(s):  Return to normal sleep pattern, 5-7 hours nightly.  Met:  Yes  Target date:  By Discharge   As evidenced by: "I slept good last night.  There's something about home sweet home, there's nothing like sleeping at home."  Attendees: Patient:  [name]  05/30/2011  10:13 AM   Family:     Physician:  Dr. Harvie Heck [place] 05/30/2011  10:13 AM   Nursing:   Robbie Louis, RN 05/30/2011  10:13 AM   Case Manager:  Ambrose Mantle, LCSW 05/30/2011  10:13 AM   Counselor:     Other:   Other:     Other:     Other:      Scribe for Treatment Team:   Sarina Ser, 05/30/2011, 10:13 AM

## 2011-05-30 NOTE — Progress Notes (Signed)
[facility] MD Progress Note  05/30/2011 1:29 PM  Diagnosis:  Axis I: Schizoaffective Disorder - Bipolar Type.   The patient was seen today and reports the following:   ADL's: Improving.  Sleep: The patient reports to sleeping much better last night.  Appetite: The patient reports a good appetite today.   Mild>(1-10) >Severe  Hopelessness (1-10): 0  Depression (1-10): 0  Anxiety (1-10): 0   Suicidal Ideation: The patient adamantly denies any suicidal ideations today.  Plan: No  Intent: No  Means: No   Homicidal Ideation: The patient adamantly denies any homicidal ideations today.  Plan: No  Intent: No.  Means: No   General Appearance /Behavior: Somewhat unkempt.  Eye Contact: Good.  Speech: Slightly Pressured.  Motor Behavior: Slightly slowed.  Level of Consciousness: Alert  Mental Status: Alert and Oriented x 3.  Mood: Depressed - Euthymic today.  Affect: Bright today.  Anxiety Level: None reported.  Thought Process: Much more focused.  Thought Content: The patient denied any auditory or visual hallucinations today as well as any delusional thinking. Perception: Fair.  Judgment: Fair.  Insight: Fair.  Cognition: Mostly appropriate.  Sleep:  Number of Hours: 2.75   Vital Signs:Blood pressure 139/82, pulse 93, temperature 98.1 F (36.7 C), temperature source Oral, resp. rate 18, SpO2 99.00%.  Lab Results: No results found for this or any previous visit (from the past 48 hour(s)).  Treatment Plan Summary:  1. Daily contact with patient to assess and evaluate symptoms and progress in treatment  2. Medication management  3. The patient will deny suicidal ideations or homicidal ideations for 48 hours prior to discharge and have a depression and anxiety rating of 3 or less. The patient will also deny any auditory or visual hallucinations or delusional thinking and display no manic or hypomanic behaviors.   Plan:  1. Will continue current medications today.  2. Continue to  monitor. 3. Possible discharge tomorrow.   [name] 05/30/2011, 1:29 PM

## 2011-05-30 NOTE — Progress Notes (Signed)
Recreation Therapy Group Note  Date: 05/30/2011         Time: 0930      Group Topic/Focus: The focus of this group is on enhancing the patient's understanding of leisure, barriers to leisure, and the importance of engaging in positive leisure activities upon discharge for improved total health.  Participation Level: Minimal  Participation Quality: Intrusive and Monopolizing  Affect: Labile  Cognitive: Confused  Additional Comments: Patient laughing at times, reporting that peers told her to shut up (which they hadn't) at others. Patient demanding RT stop group to get her a cup of coffee and a blanket. Firm limits set, which agitated patient.    Nasim Habeeb 05/30/2011 11:56 AM

## 2011-05-30 NOTE — Progress Notes (Signed)
Pt. Was in the dayroom interacting with her peers and staff.  Pt. Reports a good day and states she will be discharging tomorrow.  Writer ask pt. Where she will be discharging to and pt. States with [name] her [place].  Pt. 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  Pt. Then starts a snapping her finger and states she taught them to shake it. Pt. And peers starts laughing.  Pt. Then begins to talk about another man that she loved.  Pt. Is very child like and enjoys the attention she gets from her peers.  Pt. Denies SI, HI, and or AH.  Pt.. Had no complaints of pain or discomfort noted at this time.

## 2011-05-31 MED ORDER — CHLORPROMAZINE HCL 50 MG PO TABS: 50.0000 mg | ORAL_TABLET | Freq: Every day | ORAL | Status: DC

## 2011-05-31 MED ORDER — CHLORPROMAZINE HCL 25 MG PO TABS
25.0000 mg | ORAL_TABLET | Freq: Every day | ORAL | Status: DC
  Administered 2011-05-31: 25 mg via ORAL
  Filled 2011-05-31 (×2): qty 1

## 2011-05-31 NOTE — Progress Notes (Signed)
[facility] MD Progress Note  05/31/2011 3:20 PM  Diagnosis:  Axis I: Schizoaffective Disorder - Bipolar Type.   The patient was seen today and reports the following:   ADL's: Improving.  Sleep: The patient continues to not sleep at night.  Appetite: The patient reports a good appetite today.   Mild>(1-10) >Severe  Hopelessness (1-10): 0  Depression (1-10): 0  Anxiety (1-10): 0   Suicidal Ideation: The patient adamantly denies any suicidal ideations today.  Plan: No  Intent: No  Means: No   Homicidal Ideation: The patient adamantly denies any homicidal ideations today.  Plan: No  Intent: No.  Means: No   General Appearance /Behavior: Remains somewhat unkempt.  Eye Contact: Good.  Speech: Slightly Pressured.  Motor Behavior: Slightly slowed.  Level of Consciousness: Alert  Mental Status: Alert and Oriented x 3.  Mood: Depressed - Euthymic today.  Affect: Bright today.  Anxiety Level: None reported.  Thought Process: More focused.  Thought Content: The patient denied any auditory or visual hallucinations today.  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 Perception: Fair.  Judgment: Fair.  Insight: Fair.  Cognition: Mostly appropriate.  Sleep:  Number of Hours: 1   Vital Signs:Blood pressure 158/114, pulse 103, temperature 98.5 F (36.9 C), temperature source Oral, resp. rate 18, SpO2 99.00%.  Lab Results: No results found for this or any previous visit (from the past 48 hour(s)).  Treatment Plan Summary:  1. Daily contact with patient to assess and evaluate symptoms and progress in treatment  2. Medication management  3. The patient will deny suicidal ideations or homicidal ideations for 48 hours prior to discharge and have a depression and anxiety rating of 3 or less. The patient will also deny any auditory or visual hallucinations or delusional thinking and display no manic or hypomanic behaviors.   Plan:  1. Will continue current medications  today.  2. Continue to monitor.  3. Will start Thorazine 25 mgs po qhs to address the patient's difficulty sleeping as well as her ongoing delusional thinking. 4. Labs including CBC with Diff, CMP, TSH. Free T3, Free T4.  Randy Readling 05/31/2011, 3:20 PM

## 2011-05-31 NOTE — Progress Notes (Signed)
Adult Services Patient-Family Contact/Session  Attendees:  Patient's significant other, Alric Ran):  Discuss patient's baseline  Safety Concerns:  none  Narrative:  Spoke with [name] about coming to visit patient and to see if she is back to baseline. He has not talked to her since she has been here. He admitted that he had gotten some calls on his cell that he did not recognize, but had not returned them. He stated he will visit her tomorrow and asked for her code number. He will pick her up when she is discharged and she will return home with him.  Barrier(s):  Patient's chronic illness  Interventions:  Discussed discharge  Recommendation(s): Continued inpatient treatment  Follow-up Required:  Yes  Explanation:  He will call with report  Veto Kemps 05/31/2011, 12:34 PM

## 2011-05-31 NOTE — Progress Notes (Signed)
Counselor has made two attempts this morning to get in touch with [name] ( patient's significant other) to discuss discharge. Left messages for him to return calls. Patient gave another number [phone] to reach him, however there is just a busy signal.

## 2011-05-31 NOTE — Progress Notes (Signed)
[facility] Group Notes:  (Counselor/Nursing/MHT/Case Management/Adjunct)  05/31/2011 12:27 PM  Type of Therapy:  Group Therapy  Participation Level:  Did Not Attend   [name] 05/31/2011, 12:27 PM

## 2011-05-31 NOTE — Progress Notes (Signed)
Pt was up in dayroom upon first assessment this morning.  She filled out her own self-inventory this morning.  She denied any depression but she admitted to feeling anxious because she is wanting to go home.  During discharge planning this morning she told the group that she had never told us the truth before now.  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  She became tearful and sad while telling her story.  As the day went on, she claimed she only told us that so we would send her home.  She stated,"I thought if y'all thought I was dying, you would let me go on home today" The counselor has spoken with [name] pt's fiance and he plans to visit her tomorrow to let staff know if she is back to baseline or not.  He does plan to pick her up when she is ready to go home.  Dr. Allena Katz did order thorazine to start tonight at bedtime along with several labs to be drawn tonight.  Pt voiced understanding to the med change and the labs to be drawn.

## 2011-05-31 NOTE — Discharge Planning (Signed)
Met with patient in Aftercare Planning Group, where she was unhappy and complaining throughout group.  Was adamant about being discharged today.  Started to discuss additional things wrong with her such as "leukemia all over my body", became upset when Case Manager attempted to stop her rambling.  Appointment obtained for [facility] 9 days away, so contacted [facility] for follow-up to bridge that time period.  That appointment also was set 9 days out, as their earliest available.  The first appointment for intake with the ACTT is at their office, which may be difficult for her to attend.  To discuss with [name], who will be visiting patient tomorrow at request of treatment team.  Ambrose Mantle, LCSW 05/31/2011, 1:20 PM

## 2011-05-31 NOTE — Progress Notes (Signed)
Pt. Was in bed when writer attempted the first assessment.  Pt. Had been requested to go have her blood drawn while we were in report and had refused.  The phlebotomist would not come onto the 400 hall because she stated she was running late per hall tech so labs had to be reordered for the next morning.  Pt. Is very labile and childlike. She will say she is tired or she cannot walk one minute and be up laughing, walking and talking the next.  The pt. Was saying that she was sleepy when writer went in and closed her eyes to ignore the Clinical research associate.  Pt. Later came out into the dayroom and was laughing and saying she was going to go home tomorrow.  Pt. Says she has to go home with [name] so she can go shopping.  Pt. Denies SI, HI, and AH.  Pt. Has no complaints of pain or discomfort noted at this time.

## 2011-05-31 NOTE — Progress Notes (Signed)
[facility] Group Notes:  (Counselor/Nursing/MHT/Case Management/Adjunct)  05/31/2011 12:29 PM  Type of Therapy:  Group Therapy  Participation Level:  None  Participation Quality:  Intrusive and Resistant  Affect:  Angry and Irritable  Cognitive:  Delusional  Insight:  None  Engagement in Group:  None  Engagement in Therapy:  None  Modes of Intervention:  Limit-setting and Orientation  Summary of Progress/Problems: Patient was intrusive and demanding that one of her peers be taken out of group. Also angry toward another peer. Patient became angry also when she thought she couldn't go home. Patient state "I'm dying. I just need to leave her so I can go home to die". Patient eventually had to be sent out of group due to the fact that she would not be redirected.   HartisAram Beecham 05/31/2011, 12:29 PM

## 2011-06-01 LAB — COMPREHENSIVE METABOLIC PANEL
Alkaline Phosphatase: 50 U/L (ref 39–117)
BUN: 14 mg/dL (ref 6–23)
Chloride: 102 mEq/L (ref 96–112)
GFR calc Af Amer: 86 mL/min — ABNORMAL LOW (ref >90)
GFR calc non Af Amer: 74 mL/min — ABNORMAL LOW (ref >90)
Glucose, Bld: 105 mg/dL — ABNORMAL HIGH (ref 70–99)
Potassium: 3.8 mEq/L (ref 3.5–5.1)
Total Bilirubin: 0.1 mg/dL — ABNORMAL LOW (ref 0.3–1.2)
Total Protein: 6.8 g/dL (ref 6.0–8.3)

## 2011-06-01 LAB — DIFFERENTIAL
Basophils Absolute: 0 10*3/uL (ref 0.0–0.1)
Lymphocytes Relative: 38 % (ref 12–46)
Neutro Abs: 3.4 10*3/uL (ref 1.7–7.7)
Neutrophils Relative %: 52 % (ref 43–77)

## 2011-06-01 LAB — CBC
Platelets: 172 10*3/uL (ref 150–400)
RDW: 15.1 % (ref 11.5–15.5)
WBC: 6.6 10*3/uL (ref 4.0–10.5)

## 2011-06-01 MED ORDER — CHLORPROMAZINE HCL 50 MG PO TABS
50.0000 mg | ORAL_TABLET | Freq: Every day | ORAL | Status: DC
  Administered 2011-06-01 – 2011-06-02 (×2): 50 mg via ORAL
  Filled 2011-06-01: qty 1
  Filled 2011-06-01: qty 7
  Filled 2011-06-01 (×2): qty 1

## 2011-06-01 NOTE — Progress Notes (Signed)
Pt. Was in the dayroom when the writer arrived for the first assessment.  Pt. Was laughing and interacting with her peers but was sad that she did not go home today.  Pt. Keeps saying that [name] is coming to get her and she talks about how much she loves him etc.   She also calls him by another name at times but states that is what she calls him at home when writer thoughts she was speaking of another man.  Pt. Does often get confused and talks about different men that she loves.  Pt. Denies SI, HI, and AH.  No complaints of pain or discomfort noted at this time.
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
## 2011-06-01 NOTE — Progress Notes (Signed)
Pt was up in dayroom upon first assessment eating her breakfast.  She was wanting to go home today and knew that [name] was to visit her today to give his input on how she is doing.  By lunch time, she was back in bed asleep and took her meds, ate and went back to sleep.  She woke again at 1700 to take her medications and asked for food.  She was talking to herself and asked who she was talking with she stated, "my family" By 49, [name] had not made it here to visit not sure why he didn't show.  Her thorazine will be increased to 50 mg tonight and she will have several labs drawn tonight.  She had denied any depression or hopelessness on her self-inventory but did admit to anxiety because she is wanting to go home.

## 2011-06-01 NOTE — Progress Notes (Signed)
[facility] MD Progress Note  06/01/2011 1:28 PM  Diagnosis:  Axis I: Schizoaffective Disorder - Bipolar Type.   The patient was seen today and reports the following:   ADL's: Improving.  Sleep: The patient slept 4 hours with the addition of Thorazine last night.  Appetite: The patient reports a good appetite today.   Mild>(1-10) >Severe  Hopelessness (1-10): 0  Depression (1-10): 0  Anxiety (1-10): 0   Suicidal Ideation: The patient adamantly denies any suicidal ideations today.  Plan: No  Intent: No  Means: No   Homicidal Ideation: The patient adamantly denies any homicidal ideations today.  Plan: No  Intent: No.  Means: No   General Appearance /Behavior: Remains somewhat unkempt.  Eye Contact: Good.  Speech: Slightly Pressured.  Motor Behavior: Slightly slowed.  Level of Consciousness: Alert  Mental Status: Alert and Oriented x 3.  Mood: Depressed - Euthymic today.  Affect: Bright today.  Anxiety Level: None reported.  Thought Process: More focused.  Thought Content: The patient denied any auditory or visual hallucinations today. Remains somewhat delusional at times. She reports today that her Father has come back alive.    Perception: Fair.  Judgment: Fair.  Insight: Fair.  Cognition: Mostly appropriate.  Sleep:  Number of Hours: 4   Vital Signs:Blood pressure 133/90, pulse 72, temperature 97.5 F (36.4 C), temperature source Oral, resp. rate 18, SpO2 96.00%.  Lab Results: No results found for this or any previous visit (from the past 48 hour(s)).  Treatment Plan Summary:  1. Daily contact with patient to assess and evaluate symptoms and progress in treatment  2. Medication management  3. The patient will deny suicidal ideations or homicidal ideations for 48 hours prior to discharge and have a depression and anxiety rating of 3 or less. The patient will also deny any auditory or visual hallucinations or delusional thinking and display no manic or hypomanic behaviors.     Plan:  1. Will continue current medications today.  2. Continue to monitor.  3. Will increase Thorazine to 50 mgs po qhs to further address the patient's difficulty sleeping as well as her ongoing delusional thinking.  4. Labs reordered for tonight including CBC with Diff, CMP, TSH. Free T3, Free T4, Serum Depakote Level and Iron and TIBC ordered. Yesterday Labs were cancelled for unknown reasons.  Bonny Egger 06/01/2011, 1:28 PM

## 2011-06-02 LAB — IRON AND TIBC
Iron: 63 ug/dL (ref 42–135)
Saturation Ratios: 19 % — ABNORMAL LOW (ref 20–55)
UIBC: 270 ug/dL (ref 125–400)

## 2011-06-02 LAB — T4, FREE: Free T4: 1.21 ng/dL (ref 0.80–1.80)

## 2011-06-02 NOTE — Tx Team (Signed)
Interdisciplinary Treatment Plan Update (Adult)  Date:  06/02/2011  Time Reviewed:  1:47 PM   Progress in Treatment: Attending groups:  Yes Participating in groups:    Yes, sometimes appropriately, sometimes not Taking medication as prescribed:    Yes Tolerating medication:   Yes Family/Significant other contact made:  Yes with POA/boyfriend Patient understands diagnosis:   No Discussing patient identified problems/goals with staff:   Yes Medical problems stabilized or resolved:   Yes Denies suicidal/homicidal ideation:  Yes Issues/concerns per patient self-inventory:   None Other:  New problem(s) identified: Yes, Describe:  POA was supposed to come visit, has not yet done so -- we need feedback about patient's nearness to baseline  Reason for Continuation of Hospitalization: Delusions  Hallucinations Medication stabilization  Interventions implemented related to continuation of hospitalization:  Medication monitoring and adjustment, safety checks Q15 min., group therapy, psychoeducation, collateral contact  Additional comments:  Not applicable  Estimated length of stay:  1-3 days  Discharge Plan:  Return home, follow up with [facility]  New goal(s):  Get feedback from collateral to ensure is close to baseline and stable for discharge.  Review of initial/current patient goals per problem list:   1.  Goal(s):  Return psychosis to baseline  Met:  No  Target date:  By Discharge   As evidenced by:  A little more delusional  2.  Goal(s):   Improve hygiene to healthy, manageable level  Met:  Yes  Target date:  By Discharge   As evidenced by:  Is attending to all ADLs  3.  Goal(s):  Return to normal sleep patter, 5-7 hours nightly.  Met:  No  Target date:  By Discharge   As evidenced by:  Sleeping appropriate amounts    Attendees: Patient:  [name]  06/02/2011  1:47 PM   Family:     Physician:  Dr. Harvie Heck Readling 06/02/2011  1:47 PM     Nursing:   Izola Price, RN 06/02/2011  1:47 PM   Case Manager:  Ambrose Mantle, LCSW 06/02/2011  1:47 PM   Counselor:  Marni Griffon   Other:        Other:     Other:     Other:      Scribe for Treatment Team:   Sarina Ser, 06/02/2011, 1:47 PM
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
## 2011-06-02 NOTE — Progress Notes (Signed)
Patient up and attending groups most of the day.  Remains incontinent of urine.  Did agree to take a tub bath today and stayed in there for about a half hour.  Continues to have conversations with people who are not there.  Voice is loud and intrusive.  Frequent requests to be discharged, but we continue to remind her that [name] needs to come and let us know if he can take her home at this time.  Appetite good.  Denies pain.  Denies suicidal ideation or thoughts of self harm.

## 2011-06-02 NOTE — Progress Notes (Signed)
[facility] Group Notes:  (Counselor/Nursing/MHT/Case Management/Adjunct)  06/02/2011 4:15 PM  Type of Therapy:  Psychoeducational Skills  Participation Level:  Active  Participation Quality:  Drowsy  Affect:  Anxious  Cognitive:  Alert and Oriented  Insight:  Limited  Engagement in Group:  Limited  Engagement in Therapy:  Limited  Modes of Intervention:  Activity, Clarification, Education, Problem-solving, Socialization and Support   Summary of Progress/Problems: rogress noted. Pt minimally participated in group which was focused on coping skills.  Therapist prompted Pt to identify any problem areas.  Therapist facilitated a didactic lecture on the importance of incorporating positive activities to maintain a balanced lifestyle, including music and humor.   Pt joined in the singing and laughter. Progress noted.    Marni Griffon C 06/02/2011, 4:15 PM

## 2011-06-02 NOTE — Progress Notes (Signed)
Pt. In her room talking out loud and laughing by her self.  Pt. Reports that she was hungry and thirsty.  Food given.  Pt. Ate 75% of her food.  Pt. Reports that she feels good and wants to go home to be with [name]. Denies pain.

## 2011-06-02 NOTE — Progress Notes (Signed)
[facility] MD Progress Note  06/02/2011 1:20 PM  Diagnosis:  Axis I: Schizoaffective Disorder - Bipolar Type.   The patient was seen today and reports the following:   ADL's: Improving.  Sleep: The patient reports to sleeping much better last night.  Appetite: The patient reports a good appetite today.   Mild>(1-10) >Severe  Hopelessness (1-10): 0  Depression (1-10): 0  Anxiety (1-10): 0   Suicidal Ideation: The patient adamantly denies any suicidal ideations today.  Plan: No  Intent: No  Means: No   Homicidal Ideation: The patient adamantly denies any homicidal ideations today.  Plan: No  Intent: No.  Means: No   General Appearance /Behavior: Remains somewhat unkempt but much improved since admission.  Eye Contact: Good.  Speech: Appropriate in rate and volume.  Motor Behavior: Slightly slowed.  Level of Consciousness: Alert  Mental Status: Alert and Oriented x 3.  Mood: Depressed - Euthymic today.  Affect: Bright today.  Anxiety Level: None reported.  Thought Process: More focused.  Thought Content: The patient denied any auditory or visual hallucinations today. No delusions noted today.  Perception: Fair.  Judgment: Fair.  Insight: Fair.  Cognition: Mostly appropriate.  Sleep:  Number of Hours: 6.5   Vital Signs:Blood pressure 131/74, pulse 107, temperature 97.9 F (36.6 C), temperature source Oral, resp. rate 17, SpO2 98.00%.  Lab Results:  Results for orders placed during the hospital encounter of 05/17/11 (from the past 48 hour(s))  COMPREHENSIVE METABOLIC PANEL     Status: Abnormal   Collection Time   06/01/11  8:10 PM      Component Value Range Comment   Sodium 137  135 - 145 (mEq/L)    Potassium 3.8  3.5 - 5.1 (mEq/L)    Chloride 102  96 - 112 (mEq/L)    CO2 28  19 - 32 (mEq/L)    Glucose, Bld 105 (*) 70 - 99 (mg/dL)    BUN 14  6 - 23 (mg/dL)    Creatinine, Ser 2.13  0.50 - 1.10 (mg/dL)    Calcium 9.8  8.4 - 10.5 (mg/dL)    Total Protein 6.8  6.0 - 8.3  (g/dL)    Albumin 3.0 (*) 3.5 - 5.2 (g/dL)    AST 8  0 - 37 (U/L)    ALT 5  0 - 35 (U/L)    Alkaline Phosphatase 50  39 - 117 (U/L)    Total Bilirubin 0.1 (*) 0.3 - 1.2 (mg/dL)    GFR calc non Af Amer 74 (*) >90 (mL/min)    GFR calc Af Amer 86 (*) >90 (mL/min)   CBC     Status: Abnormal   Collection Time   06/01/11  8:10 PM      Component Value Range Comment   WBC 6.6  4.0 - 10.5 (K/uL)    RBC 3.40 (*) 3.87 - 5.11 (MIL/uL)    Hemoglobin 11.0 (*) 12.0 - 15.0 (g/dL)    HCT 08.6 (*) 57.8 - 46.0 (%)    MCV 99.1  78.0 - 100.0 (fL)    MCH 32.4  26.0 - 34.0 (pg)    MCHC 32.6  30.0 - 36.0 (g/dL)    RDW 46.9  62.9 - 52.8 (%)    Platelets 172  150 - 400 (K/uL)   DIFFERENTIAL     Status: Normal   Collection Time   06/01/11  8:10 PM      Component Value Range Comment   Neutrophils Relative 52  43 - 77 (%)  Neutro Abs 3.4  1.7 - 7.7 (K/uL)    Lymphocytes Relative 38  12 - 46 (%)    Lymphs Abs 2.5  0.7 - 4.0 (K/uL)    Monocytes Relative 8  3 - 12 (%)    Monocytes Absolute 0.5  0.1 - 1.0 (K/uL)    Eosinophils Relative 1  0 - 5 (%)    Eosinophils Absolute 0.1  0.0 - 0.7 (K/uL)    Basophils Relative 0  0 - 1 (%)    Basophils Absolute 0.0  0.0 - 0.1 (K/uL)   IRON AND TIBC     Status: Abnormal   Collection Time   06/01/11  8:10 PM      Component Value Range Comment   Iron 63  42 - 135 (ug/dL)    TIBC 454  098 - 119 (ug/dL)    Saturation Ratios 19 (*) 20 - 55 (%)    UIBC 270  125 - 400 (ug/dL)   VALPROIC ACID LEVEL     Status: Normal   Collection Time   06/01/11  8:10 PM      Component Value Range Comment   Valproic Acid Lvl 94.6  50.0 - 100.0 (ug/mL)   TSH     Status: Normal   Collection Time   06/01/11  8:10 PM      Component Value Range Comment   TSH 3.162  0.350 - 4.500 (uIU/mL)   T3, FREE     Status: Normal   Collection Time   06/01/11  8:10 PM      Component Value Range Comment   T3, Free 2.3  2.3 - 4.2 (pg/mL)   T4, FREE     Status: Normal   Collection Time   06/01/11   8:10 PM      Component Value Range Comment   Free T4 1.21  0.80 - 1.80 (ng/dL)    Treatment Plan Summary:  1. Daily contact with patient to assess and evaluate symptoms and progress in treatment  2. Medication management  3. The patient will deny suicidal ideations or homicidal ideations for 48 hours prior to discharge and have a depression and anxiety rating of 3 or less. The patient will also deny any auditory or visual hallucinations or delusional thinking and display no manic or hypomanic behaviors.   Plan:  1. Will continue current medications today.  2. Continue to monitor.  3. Possible discharge once contact is made with her boyfriend to assure she will have supervision for a time after discharge.   Randy Readling 06/02/2011, 1:20 PM

## 2011-06-03 ENCOUNTER — Other Ambulatory Visit: Payer: Self-pay

## 2011-06-03 ENCOUNTER — Encounter (HOSPITAL_COMMUNITY): Payer: Self-pay | Admitting: Emergency Medicine

## 2011-06-03 ENCOUNTER — Emergency Department (HOSPITAL_COMMUNITY)
Admission: EM | Admit: 2011-06-03 | Discharge: 2011-06-04 | Disposition: A | Discharge status: Home / Self Care | Payer: Medicare Other | Attending: Emergency Medicine | Admitting: Emergency Medicine

## 2011-06-03 ENCOUNTER — Emergency Department (HOSPITAL_COMMUNITY): Payer: Medicare Other

## 2011-06-03 DIAGNOSIS — M25519 Pain in unspecified shoulder: Secondary | ICD-10-CM | POA: Insufficient documentation

## 2011-06-03 DIAGNOSIS — S4990XA Unspecified injury of shoulder and upper arm, unspecified arm, initial encounter: Secondary | ICD-10-CM

## 2011-06-03 DIAGNOSIS — E039 Hypothyroidism, unspecified: Secondary | ICD-10-CM | POA: Insufficient documentation

## 2011-06-03 DIAGNOSIS — I252 Old myocardial infarction: Secondary | ICD-10-CM | POA: Insufficient documentation

## 2011-06-03 DIAGNOSIS — R0602 Shortness of breath: Secondary | ICD-10-CM | POA: Insufficient documentation

## 2011-06-03 DIAGNOSIS — Z7982 Long term (current) use of aspirin: Secondary | ICD-10-CM | POA: Insufficient documentation

## 2011-06-03 DIAGNOSIS — K219 Gastro-esophageal reflux disease without esophagitis: Secondary | ICD-10-CM | POA: Insufficient documentation

## 2011-06-03 DIAGNOSIS — Z8659 Personal history of other mental and behavioral disorders: Secondary | ICD-10-CM | POA: Insufficient documentation

## 2011-06-03 DIAGNOSIS — I10 Essential (primary) hypertension: Secondary | ICD-10-CM | POA: Insufficient documentation

## 2011-06-03 DIAGNOSIS — R071 Chest pain on breathing: Secondary | ICD-10-CM | POA: Insufficient documentation

## 2011-06-03 DIAGNOSIS — I4891 Unspecified atrial fibrillation: Secondary | ICD-10-CM | POA: Insufficient documentation

## 2011-06-03 DIAGNOSIS — F17200 Nicotine dependence, unspecified, uncomplicated: Secondary | ICD-10-CM | POA: Insufficient documentation

## 2011-06-03 DIAGNOSIS — Z79899 Other long term (current) drug therapy: Secondary | ICD-10-CM | POA: Insufficient documentation

## 2011-06-03 DIAGNOSIS — R0789 Other chest pain: Secondary | ICD-10-CM

## 2011-06-03 DIAGNOSIS — E119 Type 2 diabetes mellitus without complications: Secondary | ICD-10-CM | POA: Insufficient documentation

## 2011-06-03 DIAGNOSIS — R110 Nausea: Secondary | ICD-10-CM | POA: Insufficient documentation

## 2011-06-03 DIAGNOSIS — R197 Diarrhea, unspecified: Secondary | ICD-10-CM | POA: Insufficient documentation

## 2011-06-03 LAB — DIFFERENTIAL
Basophils Relative: 0 % (ref 0–1)
Eosinophils Absolute: 0.1 10*3/uL (ref 0.0–0.7)
Eosinophils Relative: 1 % (ref 0–5)
Monocytes Relative: 10 % (ref 3–12)
Neutrophils Relative %: 49 % (ref 43–77)

## 2011-06-03 LAB — CBC
Hemoglobin: 10.3 g/dL — ABNORMAL LOW (ref 12.0–15.0)
MCH: 32.7 pg (ref 26.0–34.0)
MCHC: 33.1 g/dL (ref 30.0–36.0)
MCV: 98.7 fL (ref 78.0–100.0)

## 2011-06-03 MED ORDER — CHLORPROMAZINE HCL 50 MG PO TABS: 50.0000 mg | ORAL_TABLET | Freq: Every day | ORAL | Status: DC

## 2011-06-03 MED ORDER — LEVOTHYROXINE SODIUM 150 MCG PO TABS: 150.0000 ug | ORAL_TABLET | ORAL | Status: DC

## 2011-06-03 MED ORDER — ASPIRIN 81 MG PO TBEC: 81.0000 mg | DELAYED_RELEASE_TABLET | Freq: Every day | ORAL | Status: DC

## 2011-06-03 MED ORDER — FERROUS SULFATE 325 (65 FE) MG PO TABS: 325.0000 mg | ORAL_TABLET | Freq: Two times a day (BID) | ORAL | Status: DC

## 2011-06-03 MED ORDER — METOPROLOL TARTRATE 50 MG PO TABS: 50.0000 mg | ORAL_TABLET | Freq: Two times a day (BID) | ORAL | Status: DC

## 2011-06-03 MED ORDER — SODIUM CHLORIDE 0.9 % IV SOLN
Freq: Once | INTRAVENOUS | Status: AC
  Administered 2011-06-03: via INTRAVENOUS

## 2011-06-03 MED ORDER — AMLODIPINE BESYLATE 5 MG PO TABS: 5.0000 mg | ORAL_TABLET | Freq: Every day | ORAL | Status: DC

## 2011-06-03 MED ORDER — FOLIC ACID 1 MG PO TABS: 1.0000 mg | ORAL_TABLET | Freq: Every day | ORAL | Status: DC

## 2011-06-03 MED ORDER — DIVALPROEX SODIUM ER 500 MG PO TB24: 1000.0000 mg | ORAL_TABLET | ORAL | Status: DC

## 2011-06-03 MED ORDER — PALIPERIDONE ER 6 MG PO TB24: 6.0000 mg | ORAL_TABLET | ORAL | Status: DC

## 2011-06-03 NOTE — Progress Notes (Signed)
Called patient's significant other, Clarence. Left message that patient is being discharged today and for him to pick her up.

## 2011-06-03 NOTE — Progress Notes (Signed)
[facility] Group Notes:  (Counselor/Nursing/MHT/Case Management/Adjunct)  06/03/2011 12:49 PM  Type of Therapy:  Group Therapy  Participation Level:  Minimal  Participation Quality:  Intrusive, Inattentive and Resistant  Affect:  Blunted  Cognitive:  Delusional  Insight:  None  Engagement in Group:  Limited  Engagement in Therapy:  Limited  Modes of Intervention:  Limit-setting and Orientation  Summary of Progress/Problems: Patient kept talking while others were talking. She stated I'm God and nobody can tell me when to talk. She insisted that she is leaving today and that she wouldn't wait for [name] to pick her up.   [name] 06/03/2011, 12:49 PM

## 2011-06-03 NOTE — Progress Notes (Signed)
Suicide Risk Assessment  Discharge Assessment     Demographic factors:  Assessment Details Time of Assessment: Admission Information Obtained From: Patient Current Mental Status:  AO x 3 today. Risk Reduction Factors:  Risk Reduction Factors: Sense of responsibility to family;Religious beliefs about death  CLINICAL FACTORS:   Severe Anxiety and/or Agitation Bipolar Disorder:   Mixed State Schizophrenia:   Command hallucinatons Previous Psychiatric Diagnoses and Treatments Medical Diagnoses and Treatments/Surgeries  COGNITIVE FEATURES THAT CONTRIBUTE TO RISK:  Polarized thinking    Diagnosis:  Axis I: Schizoaffective Disorder - Bipolar Type.   The patient was seen today and reports the following:   ADL's: Improving.  Sleep: The patient reports to sleeping well last night.  Appetite: The patient reports a good appetite today.   Mild>(1-10) >Severe  Hopelessness (1-10): 0  Depression (1-10): 0  Anxiety (1-10): 0   Suicidal Ideation: The patient adamantly denies any suicidal ideations today.  Plan: No  Intent: No  Means: No   Homicidal Ideation: The patient adamantly denies any homicidal ideations today.  Plan: No  Intent: No.  Means: No   General Appearance /Behavior: Remains somewhat unkempt but much improved since admission.  Eye Contact: Good.  Speech: Appropriate in rate and volume.  Motor Behavior: Slightly slowed.  Level of Consciousness: Alert  Mental Status: Alert and Oriented x 3.  Mood: Depressed - Euthymic today.  Affect: Bright today.  Anxiety Level: None reported.  Thought Process: More focused.  Thought Content: The patient denied any auditory or visual hallucinations today. No delusions noted today.  Perception: Fair to Good.  Judgment: Fair to Good.  Insight: Fair.  Cognition: Mostly appropriate.   Treatment Plan Summary:  1. Daily contact with patient to assess and evaluate symptoms and progress in treatment  2. Medication management  3.  The patient will deny suicidal ideations or homicidal ideations for 48 hours prior to discharge and have a depression and anxiety rating of 3 or less. The patient will also deny any auditory or visual hallucinations or delusional thinking and display no manic or hypomanic behaviors.   Plan:  1. Will continue current medications today.  2. Continue to monitor.  3. Probably discharge today once her boyfriend is contacted.  SUICIDE RISK:   Minimal: No identifiable suicidal ideation.  Patients presenting with no risk factors but with morbid ruminations; may be classified as minimal risk based on the severity of the depressive symptoms   [name] 06/03/2011, 11:16 AM

## 2011-06-03 NOTE — Progress Notes (Signed)
Slept well last nite, appetite is good, energy level is high and ability to pay attention is good, denies Si or Hi, attending group and interacting w/peers,. q69min safety checks continue and support offered Safety maintained    SL RN

## 2011-06-03 NOTE — Progress Notes (Signed)
Pt is in bed resting with eyes closed   She appears to be in no distress   Q 15 min checks  Pt safe at present

## 2011-06-03 NOTE — Progress Notes (Signed)
[facility] Case Management Discharge Plan:  Will you be returning to the same living situation after discharge: Yes,  lives with POA/husband/fiance Would you like a referral for services when you are discharged:Yes,  glad to be referred to an ACT Team, which has visited her while in hospital Do you have access to transportation at discharge:Yes,  POA/husband/fiance has been asked to come pick her up.  If he is unable to do this, will ask for a taxi voucher. Do you have the ability to pay for your medications:Yes,  patient states with her insurance and money, she can go get medications filled on way home from hospital.  Interagency Information:   Release of Information for [facility] completed for patient's signature at discharge  Patient to Follow up at:  Follow-up Information    Follow up with Crystal at [facility] on 06/07/2011. (10:00 appointment - if you cannot go to their office for this intake , call them to come get you)    Contact information:   [address] [place]  Telephone:  [phone]         Patient denies SI/HI:   Yes,      Safety Planning and Suicide Prevention discussed:  Yes,  During Aftercare Planning Group, Case Manager provided psychoeducation on "Suicide Prevention Information."  This included descriptions of risk factors for suicide, warning signs that an individual is in crisis and thinking of suicide, and what to do if this occurs.  Pt indicated understanding of information provided, and was given brochure immediately at her request rather than wait until discharge, when RN will give her another.  Barrier to discharge identified:No.  Summary and Recommendations:  There are no remaining barriers to discharge.   Sarina Ser 06/03/2011, 11:48 AM
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
## 2011-06-03 NOTE — Discharge Summary (Signed)
Discharge Note  Patient:  [name] is an 60 y.o., female DOB:  [birth year]  Date of Admission:  05/17/2011  Date of Discharge:  06/03/2011  Discharge Diagnoses:  Axis I: Schizoaffective Disorder - Bipolar Type.  Axis II: no diagnoses Axis III: DM II. HTN, Obesity. DJD, Atrial Fib - Axis IV: Supportive friend helps with her care Axis V: 55/58 estimated   HPI:  Pt. is a 60 year old caucasian female. Admitted from [facility] ED with complaint of auditory hallucinations and evidence of poor self care by the [employer].  Per ED/nurses notes, Pt. Was admitted from [facility]. She was apparently living alone. She was discovered with feces all over her and her apartment. She was brought to the [facility] by the [employer]. Pt was unable to answer or provide any pertinent information. She was uncooperative and combative at the time. She was also noted to be hallucinating while responding to an internal stimuli. Since arriving to the unit, nurses notes indicated that she was urinating all over her room floor. She has a history of Schizophrenia paranoia-type, and also previous diagnosis of Schizoaffective disorder. She lives alone but has a close supportive relationship with a female friend. Occasional use of alcohol, but no known history of significant abuse or dependence   Medical history/problems: Hypothyroidism, Asthma, Mental retardation, Degenerative joint disease, UTI, Atria fibrillation, GERD, Diabetes Mellitus, Hypertension, Cholelithiasis, Obesity, Bilateral Lower extremity edema, MI.  Full physical exam was done in the emergency room where she was medically cleared.  Her primary physician is Dr. Sharyn Lull.    Course of Hospitalization: [name] was admitted to our acute stabilization unit, and we restarted her previous medications of  Paliperidone, and depakote.  Routine medications were continued with the exception of Pradaxa. [name] reported she had not been taking this regularly, and we have asked her to see  Dr. Sharyn Lull before resuming this.  We did continue her on ASA 81mg  daily.    Blood Glucose readings remained stable while here on the unit.  Her stabilization was uneventful.  She was cooperative while on the unit.  She received influenza vaccine, and pneumococcal vaccine, and a TD booster while here.    By Dec 14th she was in full contact with reality with good participation in group and unit activities and requesting to return home.  She planned for her significant other to pick her up and she will return to her own home.    Consultation:    Dr. Cleotis Lema of Internal Medicine consulted for bilateral LE in the presence of Atrial Fibrillation and there were no acute findings.     Level of Care:  OP  Discharge destination:  Home  Is patient on multiple antipsychotic therapies at discharge:  Yes,   Do you recommend tapering to monotherapy for antipsychotics?  Yes Outpatient physician will evaluate need to discontinue thorazine at bedtime.     Comments:  Follow up with Crystal @ [facility] 12/18.   Discharge Medications: Depakote ER 1000mg  AM and HS.  aspirin 81 MG EC tablet   Take 1 tablet (81 mg total) by mouth daily.     chlorproMAZINE 50 MG tablet   Commonly known as: THORAZINE   Take 1 tablet (50 mg total) by mouth at bedtime. For clear thoughts and calm sleep       [name]    ferrous sulfate 325 (65 FE) MG tablet   Take 1 tablet (325 mg total) by mouth 2 (two) times daily with  a meal.       [name]    paliperidone 6 MG 24 hr tablet   Commonly known as: [name]   Take 1 tablet (6 mg total) by mouth 2 (two) times daily in the am and at bedtime.. For Clear thoughts   metoprolol 50 MG tablet   [name]   Commonly known as: LOPRESSOR     Take 1 tablet (50 mg total) by mouth 2 (two) times daily.           omeprazole 20 MG capsule      Commonly known as: PRILOSEC      Take 20 mg by mouth 2 (two) times daily.                                   STOP taking these medications      benztropine 1 MG tablet          ciprofloxacin 500 MG tablet          dabigatran 150 MG Caps          lisinopril 40 MG tablet          nabumetone 500 MG tablet          perphenazine 2 MG tablet      [name] A 06/03/2011, 3:00 PM

## 2011-06-03 NOTE — Progress Notes (Signed)
Discharged from [facility] at this time w/prescriptions, belongings and paperwork for followup appointments, signed for release of information. Denies Si or Hi and is ready for husband to pick her up. Was happy to see him once she got out to the front of facility. SL RN

## 2011-06-03 NOTE — ED Notes (Signed)
Patient with chest pain and left arm pain, sharp in nature 10/10 pain.

## 2011-06-03 NOTE — Tx Team (Addendum)
Interdisciplinary Treatment Plan Update (Adult)  Date: 06/03/2011  Time Reviewed: 10:30 AM  Progress in Treatment:  Attending groups: Yes  Participating in groups: Yes, sometimes appropriately, sometimes not  Taking medication as prescribed: Yes  Tolerating medication: Yes  Family/Significant other contact made: Yes with POA/boyfriend  Patient understands diagnosis: Intermittently Discussing patient identified problems/goals with staff: Yes  Medical problems stabilized or resolved: Yes  Denies suicidal/homicidal ideation: Yes  Issues/concerns per patient self-inventory: None  Other:  New problem(s) identified: No  Reason for Continuation of Hospitalization: None   Interventions implemented related to continuation of hospitalization: N/A  Additional comments: Not applicable   Estimated length of stay: D/C today   Discharge Plan: Return home, follow up with [facility]   New goal(s): N/A  Review of initial/current patient goals per problem list:  1. Goal(s): Return psychosis to baseline  Met: Yes Target date: By Discharge  As evidenced by: Appears to be at baseline  2. Goal(s): Improve hygiene to healthy, manageable level  Met: Yes  Target date: By Discharge  As evidenced by: Is attending to all ADLs  3. Goal(s): Return to normal sleep pattern, 5-7 hours nightly.  Met: Yes Target date: By Discharge  As evidenced by: Sleeping appropriate amounts   3. Goal(s): Get feedback from collateral to ensure is close to baseline and stable for discharge.  Met: No, but can be discharged without the collateral contact Target date: By Discharge  As evidenced by:  Collateral contact [name] has been contacted and asked to come get patient   Attendees:  Patient: [name]   06/03/2011 11:46 AM   Family:    Physician: Dr. Harvie Heck Readling    06/03/2011 11:46 AM   Nursing: Omelia Blackwater, RN, BSN  06/03/2011 11:46 AM   Case Manager: Ambrose Mantle, LCSW    06/03/2011 11:46 AM   Counselor:  Veto Kemps, MT-BC  06/03/2011 11:46 AM   Other:  Alease Frame, RN, MSN, [facility]  06/03/2011 11:46 AM   Other:    Other:    Other:     Scribe for Treatment Team:  Ambrose Mantle, LCSW 06/03/2011, 11:47 AM

## 2011-06-04 LAB — BASIC METABOLIC PANEL
BUN: 14 mg/dL (ref 6–23)
Calcium: 9.3 mg/dL (ref 8.4–10.5)
Creatinine, Ser: 0.89 mg/dL (ref 0.50–1.10)
GFR calc Af Amer: 81 mL/min — ABNORMAL LOW (ref >90)
GFR calc non Af Amer: 70 mL/min — ABNORMAL LOW (ref >90)
Glucose, Bld: 119 mg/dL — ABNORMAL HIGH (ref 70–99)
Potassium: 4 mEq/L (ref 3.5–5.1)

## 2011-06-04 MED ORDER — HYDROCODONE-ACETAMINOPHEN 5-325 MG PO TABS
1.0000 | ORAL_TABLET | Freq: Once | ORAL | Status: AC
  Administered 2011-06-04: 1 via ORAL
  Filled 2011-06-04: qty 1

## 2011-06-04 MED ORDER — HYDROCODONE-ACETAMINOPHEN 5-325 MG PO TABS: 1.0000 | ORAL_TABLET | Freq: Four times a day (QID) | ORAL | Status: AC | PRN

## 2011-06-06 NOTE — Progress Notes (Signed)
Patient Discharge Instructions:  Discharge Note Faxed,   06/06/2011 After Visit Summary Faxed,  06/06/2011 Faxed to the Next Level Care provider:  06/06/2011 Facesheet faxed 06/06/2011  Faxed to [facility] @ [phone]  Wandra Scot, 06/06/2011, 5:49 PM

## 2011-06-16 ENCOUNTER — Encounter (HOSPITAL_COMMUNITY): Payer: Self-pay | Admitting: *Deleted

## 2011-06-16 ENCOUNTER — Emergency Department (HOSPITAL_COMMUNITY)
Admission: EM | Admit: 2011-06-16 | Discharge: 2011-06-17 | Disposition: A | Discharge status: Other Acute Inpatient Hospital | Payer: Medicare Other | Source: Home / Self Care | Attending: Emergency Medicine | Admitting: Emergency Medicine

## 2011-06-16 ENCOUNTER — Emergency Department (HOSPITAL_COMMUNITY): Payer: Medicare Other

## 2011-06-16 DIAGNOSIS — F250 Schizoaffective disorder, bipolar type: Secondary | ICD-10-CM

## 2011-06-16 HISTORY — DX: Chronic obstructive pulmonary disease, unspecified: J44.9

## 2011-06-16 LAB — URINE MICROSCOPIC-ADD ON

## 2011-06-16 LAB — COMPREHENSIVE METABOLIC PANEL
AST: 12 U/L (ref 0–37)
Albumin: 3.6 g/dL (ref 3.5–5.2)
BUN: 10 mg/dL (ref 6–23)
Calcium: 10.8 mg/dL — ABNORMAL HIGH (ref 8.4–10.5)
Creatinine, Ser: 0.85 mg/dL (ref 0.50–1.10)
Total Bilirubin: 0.3 mg/dL (ref 0.3–1.2)
Total Protein: 7.7 g/dL (ref 6.0–8.3)

## 2011-06-16 LAB — CBC
HCT: 38 % (ref 36.0–46.0)
MCH: 32.3 pg (ref 26.0–34.0)
MCHC: 32.6 g/dL (ref 30.0–36.0)
MCV: 99 fL (ref 78.0–100.0)
Platelets: 294 10*3/uL (ref 150–400)
RDW: 15.7 % — ABNORMAL HIGH (ref 11.5–15.5)
WBC: 7.5 10*3/uL (ref 4.0–10.5)

## 2011-06-16 LAB — URINALYSIS, ROUTINE W REFLEX MICROSCOPIC
Bilirubin Urine: NEGATIVE
Hgb urine dipstick: NEGATIVE
Ketones, ur: NEGATIVE mg/dL
Nitrite: NEGATIVE
Specific Gravity, Urine: 1.016 (ref 1.005–1.030)
Urobilinogen, UA: 0.2 mg/dL (ref 0.0–1.0)
pH: 6.5 (ref 5.0–8.0)

## 2011-06-16 LAB — ETHANOL: Alcohol, Ethyl (B): <11 mg/dL (ref 0–11)

## 2011-06-16 MED ORDER — ZIPRASIDONE MESYLATE 20 MG IM SOLR
10.0000 mg | Freq: Once | INTRAMUSCULAR | Status: AC
  Administered 2011-06-16: 10 mg via INTRAMUSCULAR
  Filled 2011-06-16: qty 20

## 2011-06-16 NOTE — ED Notes (Signed)
Santina Evans NP in to see pateint for eval

## 2011-06-16 NOTE — ED Notes (Signed)
patient settling down .  The verbal obscenities have ceased.

## 2011-06-16 NOTE — ED Notes (Signed)
Patient with rapid speech, very delusional, with poor hygiene admitted to TCU.  Spouse is not present on admission to give broader picture.  No IVC paper at present.  Sitter at bedside.  Urine obtained and sent.  Patietn sts that she is hungry given a sandwich, fruit, and drink.

## 2011-06-16 NOTE — ED Notes (Signed)
Santina Evans NP notified that the patient is escalating by  Yelling explicatives cussing and swearing at the staff.  Patient is also talking  With rambling speech not making much sense

## 2011-06-16 NOTE — ED Notes (Signed)
Pt arrived by ems. Per ems: pt's husband called gpd. Pt's husband states pt is " acting  Crazy". Pt has been walking in stool around the house. Pt husband did not stated to ems that pt was si/hi. Pt's husband is in the process of takingout ivc papers on pt's

## 2011-06-16 NOTE — ED Provider Notes (Signed)
History     CSN: [id number]  Arrival date & time 06/16/11  1825   First MD Initiated Contact with Patient 06/16/11 2019      Chief Complaint  Patient presents with  . Medical Clearance   HPI: A level 5 caveat applies due to mental illness. Patient is a 60 y.o. female presenting with mental health disorder.  Mental Health Problem The primary symptoms include delusions. This is a recurrent problem.  The degree of incapacity that she is experiencing as a consequence of her illness is moderate. She does not admit to suicidal ideas. She does not have a plan to commit suicide. She does not contemplate harming herself. She has not already injured self. She does not contemplate injuring another person. She has not already  injured another person. Risk factors that are present for mental illness include a history of mental illness.  Pt reports she is here to be evaluated for pneumonia. 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 States she has had a persistent productive cough and fever for several days. Pt noted w/somewhat pressured speech that is at times rambling and tangential. Admits she had an argument w/ her husband because she found out she was pregnant and her spouse wound not buy her any chewing tobacco or snuff. Denies ETOH, illicit drug use, SI or HI. States she just wants medicine for her pneumonia and then she will go home. States she would rather recover at home.  Past Medical History  Diagnosis Date  . Schizoaffective disorder   . Urinary tract infection   . Hypertension   . Atrial fibrillation   . GERD (gastroesophageal reflux disease)   . Hypothyroidism   . Myocardial infarction   . Diabetes mellitus   . Obesity   . Asthma   . COPD (chronic obstructive pulmonary disease)     Past Surgical History  Procedure Date  . Tubal ligation   . Abdominal hysterectomy     No family history on file.  History  Substance Use Topics  . Smoking status: Former Games developer  .  Smokeless tobacco: Current User    Types: Snuff, Chew  . Alcohol Use: No    OB History    Grav Para Term Preterm Abortions TAB SAB Ect Mult Living                  Review of Systems  Constitutional: Negative.   HENT: Negative.   Eyes: Negative.   Respiratory: Negative.   Cardiovascular: Negative.   Gastrointestinal: Negative.   Genitourinary: Negative.   Musculoskeletal: Negative.   Skin: Negative.   Neurological: Negative.   Hematological: Negative.   Psychiatric/Behavioral: Negative.     Allergies  Sulfamethoxazole w/trimethoprim and Penicillins  Home Medications   Current Outpatient Rx  Name Route Sig Dispense Refill  . AMLODIPINE BESYLATE 5 MG PO TABS Oral Take 1 tablet (5 mg total) by mouth daily. For blood pressure 30 tablet 0  . ASPIRIN 81 MG PO TBEC Oral Take 1 tablet (81 mg total) by mouth daily. 30 tablet 0  . CHLORPROMAZINE HCL 50 MG PO TABS Oral Take 1 tablet (50 mg total) by mouth at bedtime. For clear thoughts and calm sleep 30 tablet 0  . DIVALPROEX SODIUM ER 500 MG PO TB24 Oral Take 2 tablets (1,000 mg total) by mouth 2 (two) times daily in the am and at bedtime.. For mood stability. 120 tablet 0  . FERROUS SULFATE 325 (65 FE) MG PO TABS Oral  Take 1 tablet (325 mg total) by mouth 2 (two) times daily with a meal. 60 tablet 0  . FOLIC ACID 1 MG PO TABS Oral Take 1 tablet (1 mg total) by mouth daily. Folic Acid (vitamin)replacement 30 tablet 0  . LEVOTHYROXINE SODIUM 150 MCG PO TABS Oral Take 1 tablet (150 mcg total) by mouth every morning. Thyroid replacement hormone. 30 tablet 0  . METOPROLOL TARTRATE 50 MG PO TABS Oral Take 1 tablet (50 mg total) by mouth 2 (two) times daily. 60 tablet 0  . OMEPRAZOLE 20 MG PO CPDR Oral Take 20 mg by mouth 2 (two) times daily.     Marland Kitchen PALIPERIDONE 6 MG PO TB24 Oral Take 1 tablet (6 mg total) by mouth 2 (two) times daily in the am and at bedtime.. For Clear thoughts 60 tablet 0    BP 120/75  Pulse 77  Temp(Src) 97.5 F  (36.4 C) (Oral)  Resp 18  SpO2 96%  Physical Exam  Constitutional: She is oriented to person, place, and time. She appears well-developed and well-nourished.  HENT:  Head: Normocephalic and atraumatic.  Eyes: Conjunctivae are normal.  Neck: Neck supple.  Cardiovascular: Normal rate and regular rhythm.   Pulmonary/Chest: Effort normal and breath sounds normal.  Abdominal: Soft. Bowel sounds are normal.  Musculoskeletal: Normal range of motion.  Neurological: She is alert and oriented to person, place, and time.  Skin: Skin is warm and dry. No erythema.  Psychiatric: She has a normal mood and affect. Her behavior is normal. Her speech is rapid and/or pressured and tangential. Thought content is delusional. Thought content is not paranoid. Cognition and memory are impaired. She expresses no suicidal plans and no homicidal plans.    ED Course  Procedures 2300:   RN informs me pt has now become loud, verbally abusive and threatening. Discussed pt w/ Dr Radford Pax. Will give Geodon 10mg  and re-assess.  0400: Pt has rested through-out the night w/o further incident. I have spoken w/ Aurther Loft w/ Act Team who has agreed to evaluate pt to determine need for inpt placement vs outpt management.   9604:  Aurther Loft states she has seen pt and has requested a telepsych consult to help direct pt plan of care. I have discussed pt w/ Dr Nicanor Alcon who is aware of plan.  Labs Reviewed  CBC - Abnormal; Notable for the following:    RBC 3.84 (*)    RDW 15.7 (*)    All other components within normal limits  COMPREHENSIVE METABOLIC PANEL - Abnormal; Notable for the following:    Glucose, Bld 106 (*)    Calcium 10.8 (*)    GFR calc non Af Amer 74 (*)    GFR calc Af Amer 86 (*)    All other components within normal limits  ETHANOL  URINE RAPID DRUG SCREEN (HOSP PERFORMED)  URINALYSIS, ROUTINE W REFLEX MICROSCOPIC   No results found.   No diagnosis found.    MDM  Psychosis Telepsych consult  pending.        Leanne Chang, NP 06/17/11 [phone]

## 2011-06-16 NOTE — ED Provider Notes (Signed)
Patient feels fine. Denies SI/HI, hallucinations, delusions. According to Triage note. Patient's husband is taking out IVC papers. Will Have RN call spouse to check. Otherwise We have no reason to keep patient here  [name], [place] 06/16/11 [phone]

## 2011-06-16 NOTE — ED Notes (Signed)
CXR completed

## 2011-06-17 ENCOUNTER — Inpatient Hospital Stay (HOSPITAL_COMMUNITY)
Admission: AD | Admit: 2011-06-17 | Discharge: 2011-06-29 | DRG: 885 | Disposition: A | Discharge status: Home / Self Care | Payer: Medicare Other | Attending: Psychiatry | Admitting: Psychiatry

## 2011-06-17 DIAGNOSIS — F259 Schizoaffective disorder, unspecified: Principal | ICD-10-CM

## 2011-06-17 DIAGNOSIS — Z9119 Patient's noncompliance with other medical treatment and regimen: Secondary | ICD-10-CM

## 2011-06-17 DIAGNOSIS — I252 Old myocardial infarction: Secondary | ICD-10-CM

## 2011-06-17 DIAGNOSIS — N390 Urinary tract infection, site not specified: Secondary | ICD-10-CM

## 2011-06-17 DIAGNOSIS — F319 Bipolar disorder, unspecified: Secondary | ICD-10-CM

## 2011-06-17 DIAGNOSIS — J4489 Other specified chronic obstructive pulmonary disease: Secondary | ICD-10-CM

## 2011-06-17 DIAGNOSIS — I4891 Unspecified atrial fibrillation: Secondary | ICD-10-CM

## 2011-06-17 DIAGNOSIS — F29 Unspecified psychosis not due to a substance or known physiological condition: Secondary | ICD-10-CM

## 2011-06-17 DIAGNOSIS — Z882 Allergy status to sulfonamides status: Secondary | ICD-10-CM

## 2011-06-17 DIAGNOSIS — J449 Chronic obstructive pulmonary disease, unspecified: Secondary | ICD-10-CM

## 2011-06-17 DIAGNOSIS — E039 Hypothyroidism, unspecified: Secondary | ICD-10-CM

## 2011-06-17 DIAGNOSIS — Z91199 Patient's noncompliance with other medical treatment and regimen due to unspecified reason: Secondary | ICD-10-CM

## 2011-06-17 DIAGNOSIS — F250 Schizoaffective disorder, bipolar type: Secondary | ICD-10-CM | POA: Diagnosis present

## 2011-06-17 DIAGNOSIS — Z880 Allergy status to penicillin: Secondary | ICD-10-CM

## 2011-06-17 DIAGNOSIS — G4733 Obstructive sleep apnea (adult) (pediatric): Secondary | ICD-10-CM

## 2011-06-17 DIAGNOSIS — I10 Essential (primary) hypertension: Secondary | ICD-10-CM

## 2011-06-17 DIAGNOSIS — F79 Unspecified intellectual disabilities: Secondary | ICD-10-CM

## 2011-06-17 DIAGNOSIS — E669 Obesity, unspecified: Secondary | ICD-10-CM

## 2011-06-17 DIAGNOSIS — Z7982 Long term (current) use of aspirin: Secondary | ICD-10-CM

## 2011-06-17 DIAGNOSIS — Z79899 Other long term (current) drug therapy: Secondary | ICD-10-CM

## 2011-06-17 LAB — RAPID URINE DRUG SCREEN, HOSP PERFORMED
Barbiturates: NOT DETECTED
Benzodiazepines: NOT DETECTED
Cocaine: NOT DETECTED
Opiates: NOT DETECTED
Tetrahydrocannabinol: NOT DETECTED

## 2011-06-17 LAB — GLUCOSE, CAPILLARY
Glucose-Capillary: 123 mg/dL — ABNORMAL HIGH (ref 70–99)
Glucose-Capillary: 95 mg/dL (ref 70–99)

## 2011-06-17 MED ORDER — ASPIRIN EC 81 MG PO TBEC
81.0000 mg | DELAYED_RELEASE_TABLET | Freq: Every day | ORAL | Status: DC
  Administered 2011-06-18 – 2011-06-29 (×12): 81 mg via ORAL
  Filled 2011-06-17 (×13): qty 1

## 2011-06-17 MED ORDER — METOPROLOL TARTRATE 50 MG PO TABS
50.0000 mg | ORAL_TABLET | Freq: Two times a day (BID) | ORAL | Status: DC
  Administered 2011-06-17 – 2011-06-22 (×11): 50 mg via ORAL
  Filled 2011-06-17 (×15): qty 1

## 2011-06-17 MED ORDER — ASPIRIN EC 81 MG PO TBEC
81.0000 mg | DELAYED_RELEASE_TABLET | Freq: Every day | ORAL | Status: DC
  Administered 2011-06-17: 81 mg via ORAL
  Filled 2011-06-17 (×2): qty 1

## 2011-06-17 MED ORDER — HYDROCODONE-ACETAMINOPHEN 5-325 MG PO TABS: 1.0000 | ORAL_TABLET | Freq: Four times a day (QID) | ORAL | Status: DC | PRN

## 2011-06-17 MED ORDER — ZIPRASIDONE MESYLATE 20 MG IM SOLR
10.0000 mg | Freq: Once | INTRAMUSCULAR | Status: AC
  Administered 2011-06-17: 10 mg via INTRAMUSCULAR
  Filled 2011-06-17: qty 20

## 2011-06-17 MED ORDER — IBUPROFEN 200 MG PO TABS: 600.0000 mg | ORAL_TABLET | Freq: Three times a day (TID) | ORAL | Status: DC | PRN

## 2011-06-17 MED ORDER — CHLORPROMAZINE HCL 50 MG PO TABS
50.0000 mg | ORAL_TABLET | Freq: Every day | ORAL | Status: DC
  Administered 2011-06-17 – 2011-06-21 (×5): 50 mg via ORAL
  Filled 2011-06-17 (×9): qty 1

## 2011-06-17 MED ORDER — ALUM & MAG HYDROXIDE-SIMETH 200-200-20 MG/5ML PO SUSP: 30.0000 mL | ORAL | Status: DC | PRN

## 2011-06-17 MED ORDER — ONDANSETRON HCL 4 MG PO TABS: 4.0000 mg | ORAL_TABLET | Freq: Three times a day (TID) | ORAL | Status: DC | PRN

## 2011-06-17 MED ORDER — PALIPERIDONE ER 6 MG PO TB24
6.0000 mg | ORAL_TABLET | ORAL | Status: DC
  Administered 2011-06-17: 6 mg via ORAL
  Filled 2011-06-17 (×4): qty 1

## 2011-06-17 MED ORDER — KCL IN DEXTROSE-NACL 20-5-0.45 MEQ/L-%-% IV SOLN
INTRAVENOUS | Status: AC
  Filled 2011-06-17: qty 1000

## 2011-06-17 MED ORDER — FERROUS SULFATE 325 (65 FE) MG PO TABS
325.0000 mg | ORAL_TABLET | Freq: Two times a day (BID) | ORAL | Status: DC
  Administered 2011-06-17: 325 mg via ORAL
  Filled 2011-06-17 (×3): qty 1

## 2011-06-17 MED ORDER — FOLIC ACID 1 MG PO TABS
1.0000 mg | ORAL_TABLET | Freq: Every day | ORAL | Status: DC
  Administered 2011-06-18 – 2011-06-29 (×11): 1 mg via ORAL
  Filled 2011-06-17 (×14): qty 1

## 2011-06-17 MED ORDER — MAGNESIUM HYDROXIDE 400 MG/5ML PO SUSP: 30.0000 mL | Freq: Every day | ORAL | Status: DC | PRN

## 2011-06-17 MED ORDER — CHLORPROMAZINE HCL 25 MG PO TABS: 50.0000 mg | ORAL_TABLET | Freq: Every day | ORAL | Status: DC

## 2011-06-17 MED ORDER — ACETAMINOPHEN 325 MG PO TABS: 650.0000 mg | ORAL_TABLET | ORAL | Status: DC | PRN

## 2011-06-17 MED ORDER — INSULIN ASPART 100 UNIT/ML ~~LOC~~ SOLN: 0.0000 [IU] | Freq: Three times a day (TID) | SUBCUTANEOUS | Status: DC

## 2011-06-17 MED ORDER — PALIPERIDONE ER 6 MG PO TB24
6.0000 mg | ORAL_TABLET | ORAL | Status: DC
  Administered 2011-06-17 – 2011-06-29 (×24): 6 mg via ORAL
  Filled 2011-06-17 (×7): qty 1
  Filled 2011-06-17: qty 2
  Filled 2011-06-17 (×11): qty 1
  Filled 2011-06-17: qty 2
  Filled 2011-06-17 (×9): qty 1

## 2011-06-17 MED ORDER — AMLODIPINE BESYLATE 5 MG PO TABS
5.0000 mg | ORAL_TABLET | Freq: Every day | ORAL | Status: DC
  Administered 2011-06-18 – 2011-06-29 (×11): 5 mg via ORAL
  Filled 2011-06-17 (×13): qty 1

## 2011-06-17 MED ORDER — FOLIC ACID 1 MG PO TABS
1.0000 mg | ORAL_TABLET | Freq: Every day | ORAL | Status: DC
  Administered 2011-06-17: 1 mg via ORAL
  Filled 2011-06-17: qty 1

## 2011-06-17 MED ORDER — PANTOPRAZOLE SODIUM 40 MG PO TBEC
40.0000 mg | DELAYED_RELEASE_TABLET | Freq: Every day | ORAL | Status: DC
  Administered 2011-06-17: 40 mg via ORAL
  Filled 2011-06-17: qty 1

## 2011-06-17 MED ORDER — INSULIN ASPART 100 UNIT/ML ~~LOC~~ SOLN
0.0000 [IU] | Freq: Three times a day (TID) | SUBCUTANEOUS | Status: DC
  Administered 2011-06-26: 3 [IU] via SUBCUTANEOUS

## 2011-06-17 MED ORDER — LEVOTHYROXINE SODIUM 150 MCG PO TABS
150.0000 ug | ORAL_TABLET | ORAL | Status: DC
  Administered 2011-06-18 – 2011-06-22 (×5): 150 ug via ORAL
  Filled 2011-06-17 (×2): qty 1
  Filled 2011-06-17: qty 2
  Filled 2011-06-17 (×6): qty 1

## 2011-06-17 MED ORDER — LEVOTHYROXINE SODIUM 150 MCG PO TABS
150.0000 ug | ORAL_TABLET | ORAL | Status: DC
  Administered 2011-06-17: 150 ug via ORAL
  Filled 2011-06-17 (×3): qty 1

## 2011-06-17 MED ORDER — DIVALPROEX SODIUM ER 500 MG PO TB24
1000.0000 mg | ORAL_TABLET | ORAL | Status: DC
  Administered 2011-06-17: 1000 mg via ORAL
  Filled 2011-06-17 (×4): qty 2

## 2011-06-17 MED ORDER — AMLODIPINE BESYLATE 5 MG PO TABS
5.0000 mg | ORAL_TABLET | Freq: Every day | ORAL | Status: DC
  Administered 2011-06-17: 5 mg via ORAL
  Filled 2011-06-17 (×2): qty 1

## 2011-06-17 MED ORDER — FERROUS SULFATE 325 (65 FE) MG PO TABS
325.0000 mg | ORAL_TABLET | Freq: Two times a day (BID) | ORAL | Status: DC
  Administered 2011-06-18 – 2011-06-29 (×21): 325 mg via ORAL
  Filled 2011-06-17 (×28): qty 1

## 2011-06-17 MED ORDER — PANTOPRAZOLE SODIUM 40 MG PO TBEC
40.0000 mg | DELAYED_RELEASE_TABLET | Freq: Every day | ORAL | Status: DC
  Administered 2011-06-17 – 2011-06-22 (×7): 40 mg via ORAL
  Filled 2011-06-17 (×8): qty 1

## 2011-06-17 MED ORDER — DIVALPROEX SODIUM ER 500 MG PO TB24
1000.0000 mg | ORAL_TABLET | ORAL | Status: DC
  Administered 2011-06-17 – 2011-06-22 (×10): 1000 mg via ORAL
  Filled 2011-06-17 (×6): qty 2
  Filled 2011-06-17: qty 1
  Filled 2011-06-17 (×10): qty 2

## 2011-06-17 MED ORDER — METOPROLOL TARTRATE 25 MG PO TABS
50.0000 mg | ORAL_TABLET | Freq: Two times a day (BID) | ORAL | Status: DC
  Filled 2011-06-17 (×2): qty 1

## 2011-06-17 NOTE — ED Notes (Signed)
Patient denies pain and is resting comfortably.  

## 2011-06-17 NOTE — ED Provider Notes (Signed)
Medical screening examination/treatment/procedure(s) were performed by non-physician practitioner and as supervising physician I was immediately available for consultation/collaboration.    Nelia Shi, MD 06/17/11 [phone]

## 2011-06-17 NOTE — ED Notes (Signed)
CBG 109 

## 2011-06-17 NOTE — ED Notes (Signed)
Patient continues to sleep.

## 2011-06-17 NOTE — ED Notes (Signed)
Patient is resting comfortably. 

## 2011-06-17 NOTE — ED Notes (Signed)
Patient is now laughing to self . Presently making no further demands

## 2011-06-17 NOTE — ED Notes (Signed)
Patient awake and up to void.  No c/o voiced

## 2011-06-17 NOTE — ED Notes (Signed)
Patient awake demanding bkfst- offered cereal but declines.  In room speaking gibberish

## 2011-06-17 NOTE — Consult Note (Signed)
Patient Identification:  [name] Date of Evaluation:  06/17/2011   History of Present Illness: Patient seen in medical records reviewed. 60 year old Caucasian female who is currently floridly psychotic unable to provide any logical information. Patient was recently discharged from behavioral health I reviewed the last discharge summary as below.  Patient: [name] is an 60 y.o., female  DOB: 1950-08-09  Date of Admission: 05/17/2011  Date of Discharge: 06/03/2011  Discharge Diagnoses:  Axis I: Schizoaffective Disorder - Bipolar Type.  Axis II: no diagnoses  Axis III: DM II. HTN, Obesity. DJD, Atrial Fib -  Axis IV: Supportive friend helps with her care  Axis V: 55/58 estimated  HPI:  Pt. is a 60 year old caucasian female. Admitted from [facility] ED with complaint of auditory hallucinations and evidence of poor self care by the [employer]. Per ED/nurses notes, Pt. Was admitted from [facility]. She was apparently living alone. She was discovered with feces all over her and her apartment. She was brought to the [facility] by the [employer]. Pt was unable to answer or provide any pertinent information. She was uncooperative and combative at the time. She was also noted to be hallucinating while responding to an internal stimuli. Since arriving to the unit, nurses notes indicated that she was urinating all over her room floor. She has a history of Schizophrenia paranoia-type, and also previous diagnosis of Schizoaffective disorder. She lives alone but has a close supportive relationship with a female friend. Occasional use of alcohol, but no known history of significant abuse or dependence  Medical history/problems: Hypothyroidism, Asthma, Mental retardation, Degenerative joint disease, UTI, Atria fibrillation, GERD, Diabetes Mellitus, Hypertension, Cholelithiasis, Obesity, Bilateral Lower extremity edema, MI.  Full physical exam was done in the emergency room where she was medically cleared. Her primary  physician is Dr. Sharyn Lull.  Course of Hospitalization:  [name] was admitted to our acute stabilization unit, and we restarted her previous medications of  Paliperidone, and depakote. Routine medications were continued with the exception of Pradaxa. Meryl reported she had not been taking this regularly, and we have asked her to see Dr. Sharyn Lull before resuming this. We did continue her on ASA 81mg  daily.  Blood Glucose readings remained stable while here on the unit. Her stabilization was uneventful. She was cooperative while on the unit. She received influenza vaccine, and pneumococcal vaccine, and a TD booster while here.  By Dec 14th she was in full contact with reality with good participation in group and unit activities and requesting to return home. She planned for her significant other to pick her up and she will return to her own home.  Consultation:  Dr. Cleotis Lema of Internal Medicine consulted for bilateral LE in the presence of Atrial Fibrillation and there were no acute findings.  Level of Care: OP  Discharge destination: Home  Is patient on multiple antipsychotic therapies at discharge: Yes,  Do you recommend tapering to monotherapy for antipsychotics? Yes Outpatient physician will evaluate need to discontinue thorazine at bedtime.  Comments:  Follow up with Crystal @ [facility] 12/18.  Discharge Medications:  Depakote ER 1000mg  AM and HS.  aspirin 81 MG EC tablet     Take 1 tablet (81 mg total) by mouth daily.     chlorproMAZINE 50 MG tablet    Commonly known as: THORAZINE    Take 1 tablet (50 mg total) by mouth at bedtime. For clear thoughts and calm sleep        [name]  Mental Status Examination:   Past Medical History:     Past Medical History  Diagnosis Date  . Schizoaffective disorder   . Urinary tract infection   . Hypertension   . Atrial fibrillation   . GERD (gastroesophageal reflux disease)   . Hypothyroidism   . Myocardial  infarction   . Diabetes mellitus   . Obesity   . Asthma   . COPD (chronic obstructive pulmonary disease)        Past Surgical History  Procedure Date  . Tubal ligation   . Abdominal hysterectomy     Filed Vitals:   06/17/11 0746  BP: 108/71  Pulse: 92  Temp: 98.6 F (37 C)  Resp: 18    Lab Results:   BMET    Component Value Date/Time   NA 136 06/16/2011 1900   K 3.8 06/16/2011 1900   CL 101 06/16/2011 1900   CO2 27 06/16/2011 1900   GLUCOSE 106* 06/16/2011 1900   BUN 10 06/16/2011 1900   CREATININE 0.85 06/16/2011 1900   CALCIUM 10.8* 06/16/2011 1900   GFRNONAA 74* 06/16/2011 1900   GFRAA 86* 06/16/2011 1900    Allergies:  Allergies  Allergen Reactions  . Sulfamethoxazole W/Trimethoprim Nausea And Vomiting  . Penicillins Rash    Current Medications:  Prior to Admission medications   Medication Sig Start Date End Date Taking? Authorizing Provider  HYDROcodone-acetaminophen (NORCO) 5-325 MG per tablet Take 1-2 tablets by mouth every 6 (six) hours as needed.     Yes Historical Provider, MD  amLODipine (NORVASC) 5 MG tablet Take 1 tablet (5 mg total) by mouth daily. For blood pressure 06/03/11   [name], NP  aspirin EC 81 MG EC tablet Take 1 tablet (81 mg total) by mouth daily. 06/03/11 06/02/12  [name], NP  chlorproMAZINE (THORAZINE) 50 MG tablet Take 1 tablet (50 mg total) by mouth at bedtime. For clear thoughts and calm sleep 06/03/11 07/03/11  [name], NP  divalproex (DEPAKOTE ER) 500 MG 24 hr tablet Take 2 tablets (1,000 mg total) by mouth 2 (two) times daily in the am and at bedtime.. For mood stability. 06/03/11 06/02/12  [name], NP  ferrous sulfate 325 (65 FE) MG tablet Take 1 tablet (325 mg total) by mouth 2 (two) times daily with a meal. 06/03/11 06/02/12  [name], NP  folic acid (FOLVITE) 1 MG tablet Take 1 tablet (1 mg total) by mouth daily. Folic Acid (vitamin)replacement 06/03/11   [name], NP    levothyroxine (SYNTHROID, LEVOTHROID) 150 MCG tablet Take 1 tablet (150 mcg total) by mouth every morning. Thyroid replacement hormone. 06/03/11 06/02/12  [name], NP  metoprolol (LOPRESSOR) 50 MG tablet Take 1 tablet (50 mg total) by mouth 2 (two) times daily. 06/03/11   [name], NP  omeprazole (PRILOSEC) 20 MG capsule Take 20 mg by mouth 2 (two) times daily.     Historical Provider, MD  paliperidone ([name]) 6 MG 24 hr tablet Take 1 tablet (6 mg total) by mouth 2 (two) times daily in the am and at bedtime.. For Clear thoughts 06/03/11 07/03/11  [name], NP    Social History:    reports that she has quit smoking. Her smokeless tobacco use includes Snuff and Chew. She reports that she does not drink alcohol or use illicit drugs.   Family History:    No family history on file.   DIAGNOSIS:   AXIS I  schizoaffective  disorder   AXIS II  Deffered  AXIS III See medical notes.  AXIS IV  unspecified   AXIS V 30     Recommendations: Patient started on her home medications which include Depakote Thorazine and invega. Patient need inpatient stabilization    Eulogio Ditch, MD

## 2011-06-17 NOTE — ED Provider Notes (Signed)
Medical screening examination/treatment/procedure(s) were performed by non-physician practitioner and as supervising physician I was immediately available for consultation/collaboration.   Glynn Octave, MD 06/17/11 [phone]

## 2011-06-17 NOTE — BH Assessment (Signed)
SPOKE WITH DR REEDLING AND HE AGREES TO ADMIT PATIENT AT [facility].

## 2011-06-17 NOTE — Tx Team (Signed)
Initial Interdisciplinary Treatment Plan  PATIENT STRENGTHS: (choose at least two) Active sense of humor Supportive family/friends  PATIENT STRESSORS: Health problems Marital or family conflict   PROBLEM LIST: Problem List/Patient Goals Date to be addressed Date deferred Reason deferred Estimated date of resolution  delusional      aggression                                                 DISCHARGE CRITERIA:  Adequate post-discharge living arrangements Improved stabilization in mood, thinking, and/or behavior Need for constant or close observation no longer present Safe-care adequate arrangements made Verbal commitment to aftercare and medication compliance  PRELIMINARY DISCHARGE PLAN: Outpatient therapy Return to previous living arrangement  PATIENT/FAMIILY INVOLVEMENT: This treatment plan has been presented to and reviewed with the patient, [name], and/or family member, none.  The patient and family have been given the opportunity to ask questions and make suggestions.  [name] 06/17/2011, 11:46 PM

## 2011-06-17 NOTE — ED Notes (Signed)
Pt has been placed under IVC by EDP. Pt has been accepted by Dr. Allena Katz at [facility] to bed 405-1. EDP notified and is in agreement with disposition. RN made aware to call report. All support paperwork has been completed. Assessment notification faxed to [facility] No further CSW needs at this time.
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
## 2011-06-17 NOTE — ED Notes (Signed)
Pt. IVC'd and transferred to [facility] by [employer].

## 2011-06-17 NOTE — BH Assessment (Signed)
Assessment Note   [name] is a 60 y.o. female who presents to [facility] with psychosis.  This writer attempted to conduct psych assessment, unable to because pt had been given geodon; drowsy, sleepy. The following information is collateral.  Pt.'s spouse contacted [employer] to escort pt to ed.  Pt has been walking in feces around home, pt had ample feces on her upon arrival at ed.  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  Pt has poor hygiene, very disheveled upon arrival(feces on clothing, person).  Pt has been verbally abusive---yelling, cursing at med staff and threatening to to hit medical staff members.  Pt may need to be re-evaluated by am [facility] staff to determine depth of psychosis.  Pt.'s spouse was to IVC pt, but this action has not occurred, no papers in chart.  Info faxed to [facility] to review for inpt admission.    Axis I: Psychotic Disorder NOS Axis II: Deferred Axis III:  Past Medical History  Diagnosis Date  . Schizoaffective disorder   . Urinary tract infection   . Hypertension   . Atrial fibrillation   . GERD (gastroesophageal reflux disease)   . Hypothyroidism   . Myocardial infarction   . Diabetes mellitus   . Obesity   . Asthma   . COPD (chronic obstructive pulmonary disease)    Axis IV: other psychosocial or environmental problems, problems related to social environment and problems with primary support group Axis V: 21-30 behavior considerably influenced by delusions or hallucinations OR serious impairment in judgment, communication OR inability to function in almost all areas  Past Medical History:  Past Medical History  Diagnosis Date  . Schizoaffective disorder   . Urinary tract infection   . Hypertension   . Atrial fibrillation   . GERD (gastroesophageal reflux disease)   . Hypothyroidism   . Myocardial infarction   . Diabetes  mellitus   . Obesity   . Asthma   . COPD (chronic obstructive pulmonary disease)     Past Surgical History  Procedure Date  . Tubal ligation   . Abdominal hysterectomy     Family History: No family history on file.  Social History:  reports that she has quit smoking. Her smokeless tobacco use includes Snuff and Chew. She reports that she does not drink alcohol or use illicit drugs.  Additional Social History:  Alcohol / Drug Use Pain Medications: None  Prescriptions: None  Over the Counter: None  History of alcohol / drug use?: No history of alcohol / drug abuse Longest period of sobriety (when/how long): None  Allergies:  Allergies  Allergen Reactions  . Sulfamethoxazole W/Trimethoprim Nausea And Vomiting  . Penicillins Rash    Home Medications:  Medications Prior to Admission  Medication Dose Route Frequency Provider Last Rate Last Dose  . acetaminophen (TYLENOL) tablet 650 mg  650 mg Oral Q4H PRN Roma Kayser Schorr, NP      . alum & mag hydroxide-simeth (MAALOX/MYLANTA) 200-200-20 MG/5ML suspension 30 mL  30 mL Oral PRN Roma Kayser Schorr, NP      . ibuprofen (ADVIL,MOTRIN) tablet 600 mg  600 mg Oral Q8H PRN Roma Kayser Schorr, NP      . insulin aspart (novoLOG) injection 0-20 Units  0-20 Units Subcutaneous TID WC Roma Kayser Schorr, NP      . ondansetron (ZOFRAN) tablet 4 mg  4 mg  Oral Q8H PRN Roma Kayser Schorr, NP      . ziprasidone (GEODON) injection 10 mg  10 mg Intramuscular Once Leanne Chang, NP   10 mg at 06/16/11 2327   Medications Prior to Admission  Medication Sig Dispense Refill  . amLODipine (NORVASC) 5 MG tablet Take 1 tablet (5 mg total) by mouth daily. For blood pressure  30 tablet  0  . aspirin EC 81 MG EC tablet Take 1 tablet (81 mg total) by mouth daily.  30 tablet  0  . chlorproMAZINE (THORAZINE) 50 MG tablet Take 1 tablet (50 mg total) by mouth at bedtime. For clear thoughts and calm sleep  30 tablet  0  . divalproex (DEPAKOTE ER) 500 MG 24 hr  tablet Take 2 tablets (1,000 mg total) by mouth 2 (two) times daily in the am and at bedtime.. For mood stability.  120 tablet  0  . ferrous sulfate 325 (65 FE) MG tablet Take 1 tablet (325 mg total) by mouth 2 (two) times daily with a meal.  60 tablet  0  . folic acid (FOLVITE) 1 MG tablet Take 1 tablet (1 mg total) by mouth daily. Folic Acid (vitamin)replacement  30 tablet  0  . levothyroxine (SYNTHROID, LEVOTHROID) 150 MCG tablet Take 1 tablet (150 mcg total) by mouth every morning. Thyroid replacement hormone.  30 tablet  0  . metoprolol (LOPRESSOR) 50 MG tablet Take 1 tablet (50 mg total) by mouth 2 (two) times daily.  60 tablet  0  . omeprazole (PRILOSEC) 20 MG capsule Take 20 mg by mouth 2 (two) times daily.       . paliperidone ([name]) 6 MG 24 hr tablet Take 1 tablet (6 mg total) by mouth 2 (two) times daily in the am and at bedtime.. For Clear thoughts  60 tablet  0    OB/GYN Status:  No LMP recorded. Patient has had a hysterectomy.  General Assessment Data Location of Assessment: [facility] ED Living Arrangements: Spouse/significant other Can pt return to current living arrangement?: Yes Admission Status: Voluntary Is patient capable of signing voluntary admission?: No Transfer from: Acute Hospital Referral Source: MD  Education Status Is patient currently in school?: No Current Grade: None  Highest grade of school patient has completed: None  Name of school: [employer]  Contact person: None   Risk to self Suicidal Ideation: No Suicidal Intent: No Is patient at risk for suicide?: No Suicidal Plan?: No Access to Means: No What has been your use of drugs/alcohol within the last 12 months?: None  Previous Attempts/Gestures: No How many times?: 0  Other Self Harm Risks: None  Triggers for Past Attempts: None known Intentional Self Injurious Behavior: None Family Suicide History: No Recent stressful life event(s): Other (Comment) (Decompensation ) Persecutory voices/beliefs?:  No Depression: No Depression Symptoms:  (None ) Substance abuse history and/or treatment for substance abuse?: No Suicide prevention information given to non-admitted patients: Not applicable  Risk to Others Homicidal Ideation: No Thoughts of Harm to Others: No Comment - Thoughts of Harm to Others: Threatening to hit med staff  Current Homicidal Intent: No Current Homicidal Plan: No Access to Homicidal Means: No Identified Victim: None  History of harm to others?: Yes Assessment of Violence: On admission Violent Behavior Description: Yelling, Cursing at med staff  Does patient have access to weapons?: No Criminal Charges Pending?: No Does patient have a court date: No  Psychosis Hallucinations: None noted Delusions: Unspecified (Walking around home in stool, incoherent, rapid speech )  Mental Status Report Appear/Hygiene: Body odor;Disheveled;Poor hygiene Eye Contact: Poor Motor Activity: Unremarkable Speech: Rapid;Pressured;Loud;Abusive Level of Consciousness: Irritable;Sedated Mood: Threatening Affect: Threatening Anxiety Level: None Thought Processes: Irrelevant;Tangential;Flight of Ideas Judgement: Impaired Orientation: Place;Person;Situation Obsessive Compulsive Thoughts/Behaviors: None  Cognitive Functioning Concentration: Normal Memory: Recent Intact;Remote Intact IQ: Average Insight: Fair Impulse Control: Fair Appetite: Fair Weight Loss: 0  Weight Gain: 0  Sleep: No Change Total Hours of Sleep: 8  Vegetative Symptoms: Not bathing Vegetative Symptoms: Not bathing  Prior Inpatient Therapy Prior Inpatient Therapy: Yes Prior Therapy Dates: 2009 Prior Therapy Facilty/Provider(s): Multiple admits to [facility]  Reason for Treatment: Psychosis   Prior Outpatient Therapy Prior Outpatient Therapy: No Prior Therapy Dates: None  Prior Therapy Facilty/Provider(s): None  Reason for Treatment: None   ADL Screening (condition at time of admission) Patient's  cognitive ability adequate to safely complete daily activities?: Yes Patient able to express need for assistance with ADLs?: Yes Independently performs ADLs?: Yes Communication: Independent Dressing (OT): Independent Is this a change from baseline?: Change from baseline, expected to last >3 days Grooming: Independent Is this a change from baseline?: Change from baseline, expected to last >3 days Feeding: Independent Bathing: Independent Is this a change from baseline?: Change from baseline, expected to last >3 days Toileting: Independent Is this a change from baseline?: Change from baseline, expected to last >3days In/Out Bed: Independent Is this a change from baseline?: Change from baseline, expected to last <3 days Walks in Home: Needs assistance;Independent Weakness of Legs: None Weakness of Arms/Hands: None  Home Assistive Devices/Equipment Home Assistive Devices/Equipment: Environmental consultant (specify type)  Therapy Consults (therapy consults require a physician order) PT Evaluation Needed: No OT Evalulation Needed: No SLP Evaluation Needed: No Abuse/Neglect Assessment (Assessment to be complete while patient is alone) Physical Abuse: Denies Verbal Abuse: Denies Sexual Abuse: Denies Self-Neglect: Yes, present (Comment) (Issues w/grooming ) Values / Beliefs Cultural Requests During Hospitalization: None Spiritual Requests During Hospitalization: None Consults Spiritual Care Consult Needed: No Social Work Consult Needed: No Merchant navy officer (For Healthcare) Advance Directive: Patient does not have advance directive;Patient would not like information Pre-existing out of facility DNR order (yellow form or pink MOST form): No    Additional Information 1:1 In Past 12 Months?: No CIRT Risk: No Elopement Risk: No Does patient have medical clearance?: Yes     Disposition:  Disposition Disposition of Patient: Inpatient treatment program;Referred to [facility] ) Type of inpatient  treatment program: Adult Patient referred to: Other (Comment) [facility] for inpt admission )  On Site Evaluation by:   Reviewed with Physician:     Murrell Redden 06/17/2011 6:48 AM

## 2011-06-17 NOTE — Discharge Planning (Signed)
Patient's information has been faxed to [facility] for possible disposition.  Ileene Hutchinson , MSW, LCSWA 06/17/2011 10:54 AM [phone]

## 2011-06-17 NOTE — ED Notes (Signed)
Patient threatening to hit staff.  Stating I'm your boss and you are fired.  Bed adjusted so the patient can't play with computer

## 2011-06-17 NOTE — ED Notes (Signed)
Vital signs stable. 

## 2011-06-17 NOTE — ED Notes (Signed)
CSW contacted [name], pt's spouse, and clarified that he did not completed IVC papers for the pt.  [facility] is at capacity and will not be able to admit pt. Pt has been declined by [facility] and [facility]. CSW will proceed with [facility] referral.

## 2011-06-18 DIAGNOSIS — F259 Schizoaffective disorder, unspecified: Principal | ICD-10-CM

## 2011-06-18 DIAGNOSIS — Z91199 Patient's noncompliance with other medical treatment and regimen due to unspecified reason: Secondary | ICD-10-CM

## 2011-06-18 DIAGNOSIS — Z9119 Patient's noncompliance with other medical treatment and regimen: Secondary | ICD-10-CM

## 2011-06-18 LAB — GLUCOSE, CAPILLARY

## 2011-06-18 IMAGING — CR DG CHEST 2V
1 series · 1 of 1 positions shown · non-contrast
Comparison: 12/26/2009.

CLINICAL DATA: Chest pain.  Short of breath.

CHEST - 2 VIEW

[w chest lat]
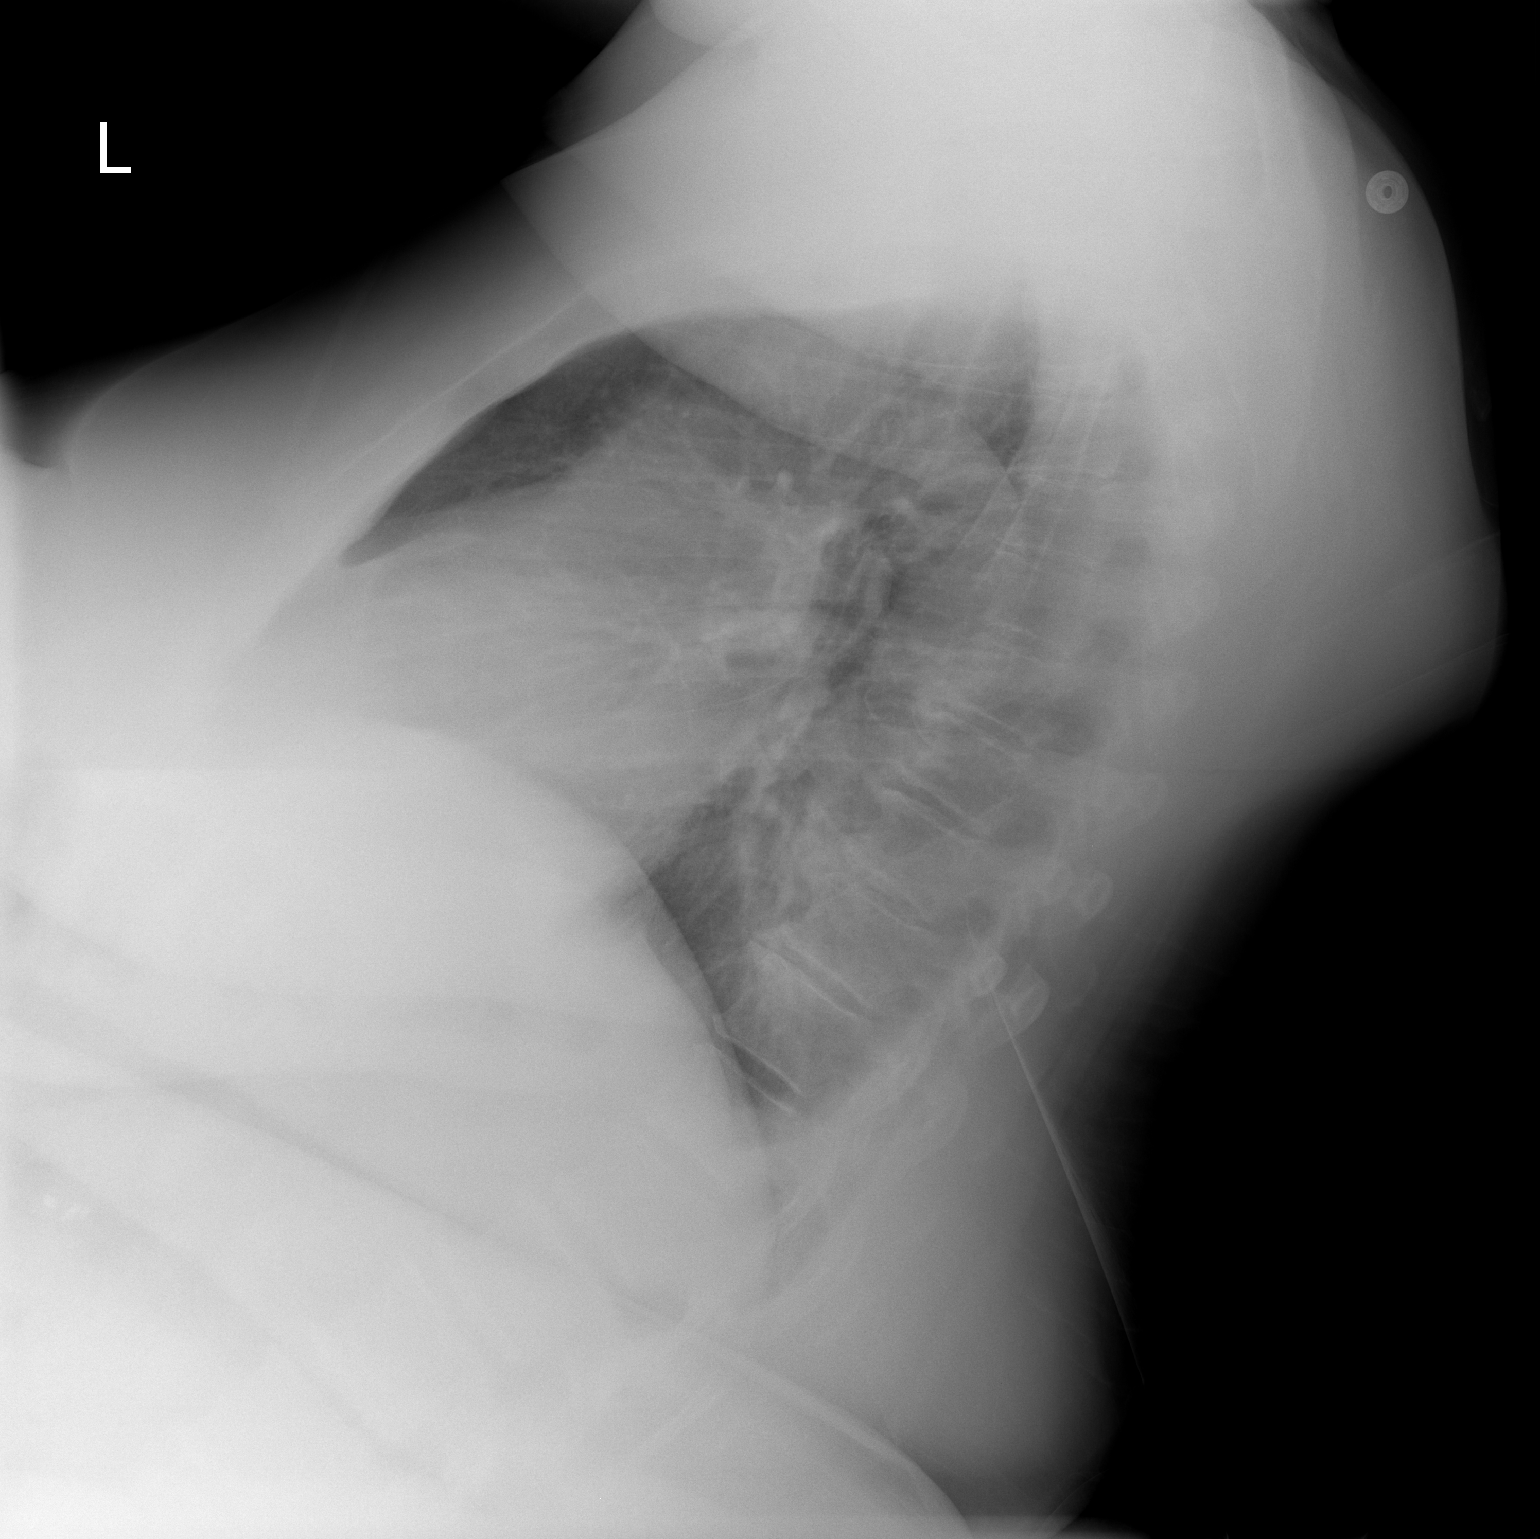

[1 of 1 positions shown; findings below may reference images not displayed]

FINDINGS: Lung volumes are lower than on prior exam.  This
accentuates the size of the cardiopericardial silhouette.  The
heart size is likely normal allowing for low lung volumes.  There
is no airspace disease.  No effusion.
IMPRESSION: Low volume chest.

## 2011-06-18 MED ORDER — SULFAMETHOXAZOLE-TMP DS 800-160 MG PO TABS: 1.0000 | ORAL_TABLET | Freq: Two times a day (BID) | ORAL | Status: DC

## 2011-06-18 MED ORDER — NITROFURANTOIN MONOHYD MACRO 100 MG PO CAPS
100.0000 mg | ORAL_CAPSULE | Freq: Two times a day (BID) | ORAL | Status: DC
  Administered 2011-06-18 – 2011-06-22 (×9): 100 mg via ORAL
  Filled 2011-06-18 (×14): qty 1

## 2011-06-18 NOTE — H&P (Signed)
Psychiatric Admission Assessment Adult  Patient Identification:  [name] Date of Evaluation:  06/18/2011 60yo M Native [place] female. History of Present Illness: Quickly became non-complaint after last admission 11/29-12/14/12. Now is manic-"I'm the head psychiatrist and can hire & fire at will." She was naked at the time of this proclamation.     Past Psychiatric History: Well known to this unit.   Substance Abuse History: see old record   Social History:    reports that she has quit smoking. Her smokeless tobacco use includes Snuff and Chew. She reports that she does not drink alcohol or use illicit drugs.  Family Psych History:see prior records   Past Medical History:     Past Medical History  Diagnosis Date  . Schizoaffective disorder   . Urinary tract infection   . Hypertension   . Atrial fibrillation   . GERD (gastroesophageal reflux disease)   . Hypothyroidism   . Myocardial infarction   . Diabetes mellitus   . Obesity   . Asthma   . COPD (chronic obstructive pulmonary disease)        Past Surgical History  Procedure Date  . Tubal ligation   . Abdominal hysterectomy     Allergies:  Allergies  Allergen Reactions  . Sulfamethoxazole W/Trimethoprim Nausea And Vomiting  . Penicillins Rash    Current Medications:  Prior to Admission medications   Medication Sig Start Date End Date Taking? Authorizing Provider  amLODipine (NORVASC) 5 MG tablet Take 1 tablet (5 mg total) by mouth daily. For blood pressure 06/03/11   [name], NP  aspirin EC 81 MG EC tablet Take 1 tablet (81 mg total) by mouth daily. 06/03/11 06/02/12  [name], NP  chlorproMAZINE (THORAZINE) 50 MG tablet Take 1 tablet (50 mg total) by mouth at bedtime. For clear thoughts and calm sleep 06/03/11 07/03/11  [name], NP  divalproex (DEPAKOTE ER) 500 MG 24 hr tablet Take 2 tablets (1,000 mg total) by mouth 2 (two) times daily in the am and at bedtime.. For mood  stability. 06/03/11 06/02/12  [name], NP  ferrous sulfate 325 (65 FE) MG tablet Take 1 tablet (325 mg total) by mouth 2 (two) times daily with a meal. 06/03/11 06/02/12  [name], NP  folic acid (FOLVITE) 1 MG tablet Take 1 tablet (1 mg total) by mouth daily. Folic Acid (vitamin)replacement 06/03/11   [name], NP  HYDROcodone-acetaminophen (NORCO) 5-325 MG per tablet Take 1-2 tablets by mouth every 6 (six) hours as needed.      Historical Provider, MD  levothyroxine (SYNTHROID, LEVOTHROID) 150 MCG tablet Take 1 tablet (150 mcg total) by mouth every morning. Thyroid replacement hormone. 06/03/11 06/02/12  [name], NP  metoprolol (LOPRESSOR) 50 MG tablet Take 1 tablet (50 mg total) by mouth 2 (two) times daily. 06/03/11   [name], NP  omeprazole (PRILOSEC) 20 MG capsule Take 20 mg by mouth 2 (two) times daily.     Historical Provider, MD  paliperidone ([name]) 6 MG 24 hr tablet Take 1 tablet (6 mg total) by mouth 2 (two) times daily in the am and at bedtime.. For Clear thoughts 06/03/11 07/03/11  [name], NP    Mental Status Examination/Evaluation: Objective:  Appearance: Disheveled  Psychomotor Activity:  Increased  Eye Contact::  Poor  Speech:  Pressured  Volume:  Increased  Mood:  Manic   Affect:  Congruent  Thought Process:  Not clear rational or  well organized   Orientation:  Other:  kows where she is   Thought Content:  Ideas of Reference:   Delusions  Suicidal Thoughts:  No  Homicidal Thoughts:  No  Judgement:  Impaired  Insight:  Lacking    DIAGNOSIS:    AXIS I Schizoaffective Disorder exacerbation of symptoms due to non-compliance   AXIS II Mental retardation, severity unknown  AXIS III See medical history. UTI   AXIS IV non-compliance   AXIS V 21-30 behavior considerably influenced by delusions or hallucinations OR serious impairment in judgment, communication OR inability to function in almost all areas     Treatment  Plan Summary: Restart discharge meds from 06/03/2011. Consider depot medication to help ensure compliance. Treat UTI with Septra    [facility] Bahar Shelden PA-C

## 2011-06-18 NOTE — Progress Notes (Signed)
[facility] Group Notes:  (Counselor/Nursing/MHT/Case Management/Adjunct)  06/18/2011 2:58 PM  Type of Therapy:  Aftercare/Counseling group  Participation Level:  Active  Participation Quality:  Redirectable  Affect:  Appropriate  Cognitive:  Delusional  Insight:  Limited  Engagement in Group:  Limited  Engagement in Therapy:  Limited  Modes of Intervention:  Clarification and Socialization  Summary of Progress/Problems: Pt. Attended after care planning group. Pt. Was given [place] [facility] information and crisis and hotline numbers to use. Pt. Agreed to use them if needed. Pt. was delusional and hard to understand. Pt. Stated she came to hospital to get herself together and was talking and laughing inappropriate during group. Pt. had to be redirected twice.   Lamar Blinks [place] 06/18/2011, 2:58 PM

## 2011-06-18 NOTE — Progress Notes (Signed)
Adult Psychosocial Assessment Update Interdisciplinary Team  Previous [facility] admissions/discharges:  Admissions Discharges  Date:05/17/11 Date: 1  Date: Date:  Date: Date:  Date: Date:  Date: Date:   Changes since the last Psychosocial Assessment (including adherence to outpatient mental health and/or substance abuse treatment, situational issues contributing to decompensation and/or relapse). Pt. States she came back to get herself together. Pt. States she was taking her medications. Pt. was delusional stating that she was a doctor at [facility]. Pt. Signed consent to talk with family member who lives with her.             Discharge Plan 1. Will you be returning to the same living situation after discharge?   Yes:     No:      If no, what is your plan?    Yes Pt. stated that she would return to her home.       2. Would you like a referral for services when you are discharged? Yes:     If yes, for what services?  No:       Yes, Pt. Lives in [place].       Summary and Recommendations (to be completed by the evaluator) Pt. Is a 60 year old female admitted for Schizoaffective disorder. Pt. Is delusional  But states she was taking medications after she left [facility]. Pt. Recommendations include: Crisis stabilization, case management, group Therapy, and Case management.                       Signature:  Neila Gear, 06/18/2011 10:59 AM

## 2011-06-18 NOTE — Progress Notes (Signed)
Patient ID: [name], female   DOB: 02-23-1951, 60 y.o.   MRN: [id number] Nursing: Pt. Lying in her bed, until she sat up to eat from a lunch tray offered to her by the MHT when she c/o hunger on awakening.  Pt. Is alert and responsive, but demanding and frequently irritable when she does not receive the answer she is looking for; "I have to get my stuff--ask him, out there!" Pt. Was calmer as she ate the meal and quieter throughout the remainder of the night.

## 2011-06-18 NOTE — Progress Notes (Signed)
Patient ID: [name], female   DOB: 30-Mar-1951, 60 y.o.   MRN: [id number] 06/18/2011 Nursing D Sharlize has remained in her bed ..urine protein electrophoresis until noon, at which point she went to dayroom...stayed there for 3-4 hrs and then returned to hed bed. She is disorganized, slow to process, has loose association of thoughts, demonstrates delusional thinking ( ie she signed her self inventory sheet " Hannelore Reading" abd she verbalizes that she is Dr. Ramond Craver wife. A She is compliant with her meds. R POC includes beginning Macrodantin tonight for UTI. PD RN [facility]

## 2011-06-18 NOTE — Progress Notes (Signed)
   Demographic factors:  Assessment Details Time of Assessment: Admission Information Obtained From: Patient Current Mental Status:    Loss Factors:    Historical Factors:    Risk Reduction Factors:     CLINICAL FACTORS: This is a 60 years old Caucasian female with the diagnoses of schizoaffective disorder presented with the bipolar manic symptoms. She is known to this unit from her previous the admissions. Patient reported that she has been noncompliant with her medication before become manic. She is currently with the severe mania, grandiosity believes she is the head of the psychiatry, she can fire or hire people on the unit. She was unable to care for herself, loss of executive functioning, pressured speech, excessive talking, loud and undressed in her room. She has a poor insight, judgment and impulsive behaviors. Patient needs inpatient psychiatric hospitalization for stabilization on her current psychotic episode and adjusting her medication as clinically required.  Schizophrenia:   Command hallucinatons Depressive state Paranoid or undifferentiated type  COGNITIVE FEATURES THAT CONTRIBUTE TO RISK:  Loss of executive function    SUICIDE RISK:   Mild:  Suicidal ideation of limited frequency, intensity, duration, and specificity.  There are no identifiable plans, no associated intent, mild dysphoria and related symptoms, good self-control (both objective and subjective assessment), few other risk factors, and identifiable protective factors, including available and accessible social support.  Mental Status: General Appearance [name]:  Guarded and Bizarre Eye Contact:  Fair Motor Behavior:  Normal and Restlestness Speech:  Garbled and Pressured Level of Consciousness:  Alert Mood:  Euphoric and Irritable Affect:  Labile Anxiety Level:  Moderate Thought Process:  Irrelevant, Circumstantial, Disorganized and Loose Thought Content:  Rumination, Delusions and Paranoid  Ideation Perception:  Hallucinations Judgment:  Poor Insight:  Absent Cognition:  Orientation time, place and person Sleep:  Sleep Number of Hours: 5.5   PLAN OF CARE: [facility] Will restart home medication and  adjust as clinically required She will receive individual, group, family counseling    Mliss Wedin,JANARDHAHA R. 06/18/2011, 12:33 PM

## 2011-06-19 LAB — GLUCOSE, CAPILLARY
Glucose-Capillary: 93 mg/dL (ref 70–99)
Glucose-Capillary: 119 mg/dL — ABNORMAL HIGH (ref 70–99)
Glucose-Capillary: 85 mg/dL (ref 70–99)

## 2011-06-19 NOTE — Progress Notes (Signed)
[facility] Adult Inpatient Family/Significant Other Suicide Prevention Education  Suicide Prevention Education:  Contact Attempts: [name](pt. States that this is her husband ) has been identified by the patient as the family member/significant other with whom the patient will be residing, and identified as the person(s) who will aid the patient in the event of a mental health crisis.  With written consent from the patient, two attempts were made to provide suicide prevention education, prior to and/or following the patient's discharge.  We were unsuccessful in providing suicide prevention education.  A suicide education pamphlet was given to the patient to share with family/significant other.  Date and time of first attempt:06/19/11 at 3:48 p.m Date and time of second attempt:  [name] 06/19/2011, 3:46 PM

## 2011-06-19 NOTE — Progress Notes (Signed)
  DAVETTE NUGENT is a 60 y.o. female [id number] 1951-02-08  06/17/2011 Principal Problem:  *Non-compliance   Mental Status:alert still manic but is more easily redirected today. 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  Subjective/Objective: Recognizes me has gowns on today and is happy. Taking meds.    Filed Vitals:   06/19/11 1144  BP: 93/67  Pulse: 86  Temp: 98 F (36.7 C)  Resp: 20    Lab Results:   BMET    Component Value Date/Time   NA 136 06/16/2011 1900   K 3.8 06/16/2011 1900   CL 101 06/16/2011 1900   CO2 27 06/16/2011 1900   GLUCOSE 106* 06/16/2011 1900   BUN 10 06/16/2011 1900   CREATININE 0.85 06/16/2011 1900   CALCIUM 10.8* 06/16/2011 1900   GFRNONAA 74* 06/16/2011 1900   GFRAA 86* 06/16/2011 1900    Medications:  Scheduled:     . amLODipine  5 mg Oral Daily  . aspirin EC  81 mg Oral Daily  . chlorproMAZINE  50 mg Oral QHS  . divalproex  1,000 mg Oral BH-qamhs  . ferrous sulfate  325 mg Oral BID WC  . folic acid  1 mg Oral Daily  . insulin aspart  0-20 Units Subcutaneous TID WC  . levothyroxine  150 mcg Oral Q0700  . metoprolol  50 mg Oral BID  . nitrofurantoin (macrocrystal-monohydrate)  100 mg Oral Q12H  . paliperidone  6 mg Oral BH-qamhs  . pantoprazole  40 mg Oral Q1200  . DISCONTD: sulfamethoxazole-trimethoprim  1 tablet Oral Q12H     PRN Meds alum & mag hydroxide-simeth, alum & mag hydroxide-simeth, HYDROcodone-acetaminophen, magnesium hydroxide, ondansetron  Plan: continue current plan of care -patient responding to treatment.   Willett Lefeber,MICKIE D. 06/19/2011

## 2011-06-19 NOTE — Progress Notes (Signed)
Patient ID: [name], female   DOB: 02/06/1951, 60 y.o.   MRN: [id number] Nursing: Pt. seen as she was lying in bed with the covers up to her chin.  Pt. Denies S/H/I's and A/V/H's, although she can be hears frequently "talking" to unseen "persons".  Pt. Is pleasant, responds to prompts for tasks, sometimes requiring 2 or more prompts.  Pt. took her meds at this time and laid back down, rambling continuously for several minutes before falling asleep. 22:30--Pt. had to be awakened to take her HS meds. 23:15--Pt. was found sitting on the toilet with wet stains on the front of her gown and a trail of urine from her bed to the front of the toilet.  Pt. was commended for attempting to make it to the toilet, though she was unable to get there in time: Pt. Smiled.  Pt.'s bed linen and gown was changed and the floor cleansed, before she returned to bed. 01:00AM--Pt. Is asleep.

## 2011-06-19 NOTE — H&P (Signed)
Chart reviewed, case discussed and agree with the treatment plan. 

## 2011-06-19 NOTE — Progress Notes (Signed)
Patient ID: [name], female   DOB: 1950/09/19, 60 y.o.   MRN: [id number] 06/19/2011 Nursing 1800   D Arnell has spent most of her day..No data found.  in the milieu. Either in the group room or in her room. She has poor hygiene. A bad BO.Has refused to bathe, despite this writer's encouragement to do so. Her affect is sad and quiet and depressed. She rambles on to herself...talking away in an incoherent - like form of speech. She mumbles to herself. A  No changes i her plan of care are made today. R Safety is maintained and POC includes maintenance iof therpeutic relationship PD RN [facility]

## 2011-06-20 ENCOUNTER — Other Ambulatory Visit: Payer: Self-pay

## 2011-06-20 ENCOUNTER — Inpatient Hospital Stay (HOSPITAL_COMMUNITY): Payer: Medicare Other

## 2011-06-20 ENCOUNTER — Encounter (HOSPITAL_COMMUNITY): Payer: Self-pay | Admitting: *Deleted

## 2011-06-20 DIAGNOSIS — R079 Chest pain, unspecified: Secondary | ICD-10-CM

## 2011-06-20 DIAGNOSIS — F250 Schizoaffective disorder, bipolar type: Secondary | ICD-10-CM | POA: Diagnosis present

## 2011-06-20 LAB — POCT I-STAT, CHEM 8
BUN: 16 mg/dL (ref 6–23)
Calcium, Ion: 1.31 mmol/L (ref 1.12–1.32)
Chloride: 103 mEq/L (ref 96–112)
Glucose, Bld: 96 mg/dL (ref 70–99)

## 2011-06-20 MED ORDER — ASPIRIN 81 MG PO CHEW: 324.0000 mg | CHEWABLE_TABLET | Freq: Once | ORAL | Status: DC

## 2011-06-20 MED ORDER — NITROGLYCERIN 0.4 MG SL SUBL
0.4000 mg | SUBLINGUAL_TABLET | SUBLINGUAL | Status: DC | PRN
  Administered 2011-06-20: 22:00:00 via SUBLINGUAL

## 2011-06-20 MED ORDER — NITROGLYCERIN 0.4 MG SL SUBL
SUBLINGUAL_TABLET | SUBLINGUAL | Status: AC
  Filled 2011-06-20: qty 25

## 2011-06-20 MED ORDER — NITROGLYCERIN 0.4 MG SL SUBL
0.4000 mg | SUBLINGUAL_TABLET | SUBLINGUAL | Status: DC | PRN
  Administered 2011-06-21: 0.4 mg via SUBLINGUAL

## 2011-06-20 NOTE — Progress Notes (Signed)
In bed with eyes closed on approach. Opened eyes spontaneously to name. Appears flat. Oriented to place self and situation. Calm and cooperative with assessment. No acute distress noted. States she has had a good day. States she attended groups and learned how important it is to stay on her medication and use/trust her support systems. Writer praised pt and reinforced need to remember those two important things. States she feels like she is ready for D/C and is hopeful MD will DC tomorrow. Encouraged to f/u with MD. Was initially resistive to getting up for MHT for lab draws but writer was able to get pt to get up and have bloodwork drawn. Otherwise offers no questions or concerns. Denies pain. Denies SI/HI/AVH and no psychotic/delusional comments made during assessment. POC and medications for the shift reviewed and understanding verbalized. Safety has been maintained with Q75minute observation. Will continue current POC.

## 2011-06-20 NOTE — Progress Notes (Signed)
[facility] MD Progress Note  06/20/2011 3:08 PM  Diagnosis:  Axis I: Schizophrenia - Paranoid Type   The patient was seen today and reports the following:   ADL's: Impaired.  Sleep: The patient reports to sleeping reasonably well.  Appetite: The patient reports a good appetite.   Mild>(1-10) >Severe  Hopelessness (1-10): 0  Depression (1-10): 0  Anxiety (1-10): 0   Suicidal Ideation: The patient adamantly denies suicidal ideations today.  Plan: No  Intent: No  Means: No   Homicidal Ideation: The patient adamantly denies any homicidal ideations today.  Plan: No  Intent: No.  Means: No   General Appearance /Behavior: Casual and disshelved.  Eye Contact: Fair.  Speech: Appropriate in rate and volume. Much spontaneous speech today.  Motor Behavior: Mild psychomotor agitation.  Level of Consciousness: Alert and Oriented x 3.  Mental Status: AO x 3.  Mood: Mild to Moderately Manic.  Affect: Mild to Moderately Expansive.  Anxiety Level: None Reported.  Thought Process: Delusional.  Thought Content: The patient denies auditory hallucinations today as well as any visual hallucinations. 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 Perception: Moderately Delusional.  Judgment: Poor.  Insight: Poor.  Cognition: Orientation time, place and person.  Sleep:  Number of Hours: 5   Vital Signs:Blood pressure 106/73, pulse 90, temperature 98.6 F (37 C), temperature source Oral, resp. rate 18, height 5\' 2"  (1.575 m), weight 132.45 kg (292 lb), SpO2 97.00%.  Lab Results:  Results for orders placed during the hospital encounter of 06/17/11 (from the past 48 hour(s))  GLUCOSE, CAPILLARY     Status: Normal   Collection Time   06/18/11  4:48 PM      Component Value Range Comment   Glucose-Capillary 97  70 - 99 (mg/dL)   GLUCOSE, CAPILLARY     Status: Abnormal   Collection Time   06/18/11  9:46 PM      Component Value Range Comment   Glucose-Capillary 101 (*) 70  - 99 (mg/dL)    Comment 1 Notify RN     GLUCOSE, CAPILLARY     Status: Normal   Collection Time   06/19/11  6:03 AM      Component Value Range Comment   Glucose-Capillary 93  70 - 99 (mg/dL)   GLUCOSE, CAPILLARY     Status: Abnormal   Collection Time   06/19/11 11:37 AM      Component Value Range Comment   Glucose-Capillary 119 (*) 70 - 99 (mg/dL)    Comment 1 Notify RN     GLUCOSE, CAPILLARY     Status: Normal   Collection Time   06/19/11  4:45 PM      Component Value Range Comment   Glucose-Capillary 85  70 - 99 (mg/dL)   GLUCOSE, CAPILLARY     Status: Normal   Collection Time   06/19/11  9:05 PM      Component Value Range Comment   Glucose-Capillary 91  70 - 99 (mg/dL)   GLUCOSE, CAPILLARY     Status: Normal   Collection Time   06/20/11  6:28 AM      Component Value Range Comment   Glucose-Capillary 75  70 - 99 (mg/dL)   GLUCOSE, CAPILLARY     Status: Normal   Collection Time   06/20/11 11:27 AM      Component Value Range Comment   Glucose-Capillary 87  70 - 99 (mg/dL)    Treatment Plan Summary:  1. Daily  contact with patient to assess and evaluate symptoms and progress in treatment  2. Medication management  3. The patient will deny suicidal ideations or homicidal ideations for 48 hours prior to discharge and have a depression and anxiety rating of 3 or less. The patient will also deny any auditory or visual hallucinations or delusional thinking.   Plan:  1. Will continue current medications. 2. Will continue to monitor. 3. The Case Manager will consider discussing Guardianship with her significant other. 4. Serum Depakote Level tonight.  Abdinasir Spadafore 06/20/2011, 3:08 PM

## 2011-06-20 NOTE — Progress Notes (Signed)
Pt with c/o chest pain which radiates down left arm at 2150. VS: P:89, R=20, BP131/87 SpO2:96% on roomair. Does appear to be in pain and is tearful during assessment. Armandina Stammer, NP notified and orders received for nitro and EMS transport to ED. Nitro given at 2157. 5 minutes after administration, appeared to be in less distress and pain. Endorsed small improvement in pain but still rated it at a "10", still radiating and she was starting to feel hot. At 2205 EMS arrived,transported pt without sitter per NP order to [facility] for evaluation.

## 2011-06-20 NOTE — Progress Notes (Signed)
Patient ID: [name], female   DOB: [birth year], 60 y.o.   MRN: [id number]   Patient has a flat affect on approach today. Talking about how she was going to have be discharged  Today because she was told that her medicaid would not pay for it. States that a nurse told her that. Argumentative when explained that a nurse cannot make that decision. Patient making the same statements over and over again. Currently denies any SI/HI or a/v hallucinations. Did take meds without issue. Staff will monitor and encourage group attendance.

## 2011-06-20 NOTE — Tx Team (Signed)
Interdisciplinary Treatment Plan Update (Adult)  Date:  06/20/2011  Time Reviewed:  10:00 AM   Progress in Treatment: Attending groups:  Yes Participating in groups:    Yes, intrusive, hard to redirect, keeps interrupting others and will not stop Taking medication as prescribed:  Yes   Tolerating medication:   Yes Family/Significant other contact made:  Not yet, needs to be made Patient understands diagnosis:   No, little insight Discussing patient identified problems/goals with staff:   Yes, insisting on discharge Medical problems stabilized or resolved:   Yes Denies suicidal/homicidal ideation:  Yes Issues/concerns per patient self-inventory:   None Other:  New problem(s) identified: Yes, Describe:  Needs a guardian  Reason for Continuation of Hospitalization: Delusions  Hallucinations Medication stabilization Other; describe Collateral contact needed to assure safety  Interventions implemented related to continuation of hospitalization:  Medication monitoring and adjustment, safety checks Q15 min., group therapy, psychoeducation, collateral contact  Additional comments:  Not applicable  Estimated length of stay:  3-5 days  Discharge Plan:  Her plan is to return to her apartment, follow-up will be made with [facility], treatment team recommendation is for her to have a guardian and to go to an assisted living facility eventually if not immediately  New goal(s):  Not applicable  Review of initial/current patient goals per problem list:   1.  Goal(s):  Reduce depression from 10 to 3.  Met:  No  Target date:  By Discharge   As evidenced by:  Asserts is not depressed, affect is depressed and tearful  2.  Goal(s):  Reduce psychotic symptoms to baseline.  Met:  No  Target date:  By Discharge   As evidenced by:  Is psychotic, needs several days more stable/pleasant before could this is baseline  3.  Goal(s):  Determine if patient can return to her  apartment  Met:  No  Target date:  By Discharge   As evidenced by:  Collateral is needed - we recommend an assisted living facility  4.  Goal(s):  Decide how to proceed regarding competency.  Met:  No  Target date:  By Discharge   As evidenced by:  Need to contact [facility] re same  Attendees: Patient:  Did not attend   Family:     Physician:  Dr. Harvie Heck Readling 06/20/2011  1:20 PM   Nursing:   Robbie Louis, RN 06/20/2011   1:20 PM   Case Manager:  Ambrose Mantle, LCSW 06/20/2011  1:20 PM   Counselor:     Other:   Manuela Schwartz, RN 06/20/2011  1:20 PM   Other:      Other:      Other:       Scribe for Treatment Team:   Sarina Ser, 06/20/2011, 10:00 AM

## 2011-06-20 NOTE — ED Notes (Signed)
Pt reports having CP, being at [facility] and not wanting to go back.  States that her CP is on the left side and radiates to  Bilateral arms.  States that her dad is a cardiologist, pt noted to be singing, states that she is 4 months pregnant.  Skin warm, dry and intact.  No acute distress noted.

## 2011-06-20 NOTE — ED Notes (Signed)
Pt from [facility] for bipolar treatment.  States that she started to have CP at 2155 tonight.  Given 1 SL nitro, BP 94 palpated.  Pt tender on palpation, with movement.  Pt reports having bilateral PNA-without being treated.  20 LAC, 324 asa, 2SL nitro.  Nitro did not change pain.  Pain 10/10.

## 2011-06-20 NOTE — Discharge Planning (Signed)
Met with patient in Aftercare Planning Group.   She was intrusive throughout group about wanting to be discharged, could not be redirected.  Group had to be halted as a result.  Case Manager spoke to [facility], and apparently the patient rescheduled her appointment one time, then did not show up for her intake at the rescheduled time.  They would like to pick her up directly from the hospital at the time she is discharged and take her by their office for intake and meeting with the doctor prior to taking her home.  They are in agreement to proceed with competency hearing at that time as well.  No case management needs today.  Per State Regulation 482.30  This chart was reviewed for medical necessity with respect to the patient's Admission/Duration of stay.   Next review due:  06/23/11   Ambrose Mantle, LCSW  06/20/2011  1:22 PM

## 2011-06-21 LAB — CBC
HCT: 34.2 % — ABNORMAL LOW (ref 36.0–46.0)
MCV: 99.7 fL (ref 78.0–100.0)
Platelets: 209 10*3/uL (ref 150–400)
RBC: 3.43 MIL/uL — ABNORMAL LOW (ref 3.87–5.11)
WBC: 5.9 10*3/uL (ref 4.0–10.5)

## 2011-06-21 LAB — POCT I-STAT TROPONIN I
Troponin i, poc: 0 ng/mL (ref 0.00–0.08)
Troponin i, poc: 0.01 ng/mL (ref 0.00–0.08)

## 2011-06-21 LAB — GLUCOSE, CAPILLARY: Glucose-Capillary: 102 mg/dL — ABNORMAL HIGH (ref 70–99)

## 2011-06-21 MED ORDER — CHLORPROMAZINE HCL 25 MG PO TABS
25.0000 mg | ORAL_TABLET | ORAL | Status: DC
Start: 2011-06-22 — End: 2011-06-29
  Administered 2011-06-22 – 2011-06-29 (×15): 25 mg via ORAL
  Filled 2011-06-21 (×17): qty 1

## 2011-06-21 NOTE — Progress Notes (Signed)
Pt was sitting up in dayroom upon first assessment.  She was given her self-inventory to fill out.  She denied any depression, hopelessness or anxiety.  She denied any S/H ideations or A/V hallucinations.  She is delusional and was talking about being raped by a female tech and was now 5 months pregnant.  She kept yelling out and demanding to go home today.  She was told by the treatment team that [name] would have to be spoken to in addtion to her ACT team.  Her ACT team is suppose to pick her up when she is ready for discharge and take her to their office for her to fill out the necessary paperwork for them to take care of her.  When she left here, she had changed her appointment with them then never showed for the second appointment.  This is why the decision was made that they would pick her up this time.  Pt wants to leave tomorrow and keeps saying that we told her she could go tomorrow.  This nurse has kept reinforcing the fact that she may not leave tomorrow if the doctor does not feel she is ready and the ACT team are not prepared to get her.   Her fiance [name] has to be in agreement too.  Pt claims she has spoken with him and he is ready for her to return home.  Informed pt that staff would have to speak with him directly.

## 2011-06-21 NOTE — ED Provider Notes (Signed)
History     CSN: [id number]  Arrival date & time 06/20/11  2214   First MD Initiated Contact with Patient 06/20/11 2309      Chief Complaint  Patient presents with  . Chest Pain    (Consider location/radiation/quality/duration/timing/severity/associated sxs/prior treatment) Patient is a 61 y.o. female presenting with chest pain. The history is provided by the patient.  Chest Pain The chest pain began 3 - 5 hours ago. Duration of episode(s) is 1 hour. Chest pain occurs constantly. The chest pain is improving. Associated with: Nothing. The severity of the pain is moderate. The quality of the pain is described as sharp. The pain does not radiate. Chest pain is worsened by stress. Pertinent negatives for primary symptoms include no fever, no fatigue, no syncope, no shortness of breath, no cough, no wheezing, no palpitations, no abdominal pain, no nausea, no vomiting and no dizziness.  Pertinent negatives for associated symptoms include no diaphoresis, no near-syncope and no weakness. She tried nitroglycerin for the symptoms. Risk factors include obesity.  Her past medical history is significant for CAD.  Procedure history is positive for cardiac catheterization.    patient currently being hospitalized at psychiatric facility. She admits to me that she does not want to be hospitalized there and prefers to be at home. Tonight she developed chest pain and was sent to the emergency department for evaluation. Pain is left-sided, sharp in quality, no radiation, denies history of similar pain. Patient is very jovial and does not seem to be overly concerned about her symptoms and is more concerned about being allowed to be discharged home. She denies any active SI or HI. She is under involuntary commitment for history of bipolar and psychosis  Past Medical History  Diagnosis Date  . Schizoaffective disorder   . Urinary tract infection   . Hypertension   . Campath-induced atrial fibrillation   .  GERD (gastroesophageal reflux disease)   . Hypothyroidism   . Myocardial infarction   . Diabetes mellitus   . Obesity   . Asthma   . COPD (chronic obstructive pulmonary disease)     Past Surgical History  Procedure Date  . Tubal ligation   . Abdominal hysterectomy     History reviewed. No pertinent family history.  History  Substance Use Topics  . Smoking status: Former Games developer  . Smokeless tobacco: Current User    Types: Snuff, Chew  . Alcohol Use: 1.2 oz/week    2 Glasses of wine per week    OB History    Grav Para Term Preterm Abortions TAB SAB Ect Mult Living                  Review of Systems  Constitutional: Negative for fever, chills, diaphoresis and fatigue.  HENT: Negative for neck pain and neck stiffness.   Eyes: Negative for pain.  Respiratory: Negative for cough, shortness of breath and wheezing.   Cardiovascular: Positive for chest pain. Negative for palpitations, syncope and near-syncope.  Gastrointestinal: Negative for nausea, vomiting and abdominal pain.  Genitourinary: Negative for dysuria.  Musculoskeletal: Negative for back pain.  Skin: Negative for rash.  Neurological: Negative for dizziness, weakness and headaches.  All other systems reviewed and are negative.    Allergies  Sulfamethoxazole w/trimethoprim and Penicillins  Home Medications   Current Outpatient Rx  Name Route Sig Dispense Refill  . AMLODIPINE BESYLATE 5 MG PO TABS Oral Take 1 tablet (5 mg total) by mouth daily. For blood pressure 30 tablet 0  .  ASPIRIN 81 MG PO TBEC Oral Take 1 tablet (81 mg total) by mouth daily. 30 tablet 0  . CHLORPROMAZINE HCL 50 MG PO TABS Oral Take 1 tablet (50 mg total) by mouth at bedtime. For clear thoughts and calm sleep 30 tablet 0  . DIVALPROEX SODIUM ER 500 MG PO TB24 Oral Take 2 tablets (1,000 mg total) by mouth 2 (two) times daily in the am and at bedtime.. For mood stability. 120 tablet 0  . FERROUS SULFATE 325 (65 FE) MG PO TABS Oral Take 1  tablet (325 mg total) by mouth 2 (two) times daily with a meal. 60 tablet 0  . FOLIC ACID 1 MG PO TABS Oral Take 1 tablet (1 mg total) by mouth daily. Folic Acid (vitamin)replacement 30 tablet 0  . HYDROCODONE-ACETAMINOPHEN 5-325 MG PO TABS Oral Take 1-2 tablets by mouth every 6 (six) hours as needed.      Marland Kitchen LEVOTHYROXINE SODIUM 150 MCG PO TABS Oral Take 1 tablet (150 mcg total) by mouth every morning. Thyroid replacement hormone. 30 tablet 0  . METOPROLOL TARTRATE 50 MG PO TABS Oral Take 1 tablet (50 mg total) by mouth 2 (two) times daily. 60 tablet 0  . OMEPRAZOLE 20 MG PO CPDR Oral Take 20 mg by mouth 2 (two) times daily.     Marland Kitchen PALIPERIDONE ER 6 MG PO TB24 Oral Take 1 tablet (6 mg total) by mouth 2 (two) times daily in the am and at bedtime.. For Clear thoughts 60 tablet 0    BP 107/62  Pulse 68  Temp(Src) 98.4 F (36.9 C) (Oral)  Resp 17  Ht 5\' 2"  (1.575 m)  Wt 292 lb (132.45 kg)  BMI 53.41 kg/m2  SpO2 100%  Physical Exam  Constitutional: She is oriented to person, place, and time. She appears well-developed and well-nourished.  HENT:  Head: Normocephalic and atraumatic.  Eyes: Conjunctivae and EOM are normal. Pupils are equal, round, and reactive to light.  Neck: Trachea normal. Neck supple. No thyromegaly present.  Cardiovascular: Normal rate, regular rhythm, S1 normal, S2 normal and normal pulses.     No systolic murmur is present   No diastolic murmur is present  Pulses:      Radial pulses are 2+ on the right side, and 2+ on the left side.  Pulmonary/Chest: Effort normal and breath sounds normal. She has no wheezes. She has no rhonchi. She has no rales. She exhibits no tenderness.  Abdominal: Soft. Normal appearance and bowel sounds are normal. There is no tenderness. There is no CVA tenderness and negative Murphy's sign.  Musculoskeletal:       BLE:s Calves nontender, no cords or erythema, negative Homans sign  Neurological: She is alert and oriented to person, place, and  time. She has normal strength. No cranial nerve deficit or sensory deficit. GCS eye subscore is 4. GCS verbal subscore is 5. GCS motor subscore is 6.  Skin: Skin is warm and dry. No rash noted. She is not diaphoretic.  Psychiatric: Her affect is inappropriate. Her speech is tangential.       bizzare behavior, patient believes she is 4 months pregnant and crowning.     ED Course  Procedures (including critical care time)  Results for orders placed during the hospital encounter of 06/17/11  GLUCOSE, CAPILLARY      Component Value Range   Glucose-Capillary 96  70 - 99 (mg/dL)  GLUCOSE, CAPILLARY      Component Value Range   Glucose-Capillary 102 (*) 70 -  99 (mg/dL)   Comment 1 Documented in Chart     Comment 2 Notify RN    GLUCOSE, CAPILLARY      Component Value Range   Glucose-Capillary 97  70 - 99 (mg/dL)  GLUCOSE, CAPILLARY      Component Value Range   Glucose-Capillary 101 (*) 70 - 99 (mg/dL)   Comment 1 Notify RN    GLUCOSE, CAPILLARY      Component Value Range   Glucose-Capillary 93  70 - 99 (mg/dL)  GLUCOSE, CAPILLARY      Component Value Range   Glucose-Capillary 119 (*) 70 - 99 (mg/dL)   Comment 1 Notify RN    GLUCOSE, CAPILLARY      Component Value Range   Glucose-Capillary 85  70 - 99 (mg/dL)  GLUCOSE, CAPILLARY      Component Value Range   Glucose-Capillary 91  70 - 99 (mg/dL)  GLUCOSE, CAPILLARY      Component Value Range   Glucose-Capillary 75  70 - 99 (mg/dL)  GLUCOSE, CAPILLARY      Component Value Range   Glucose-Capillary 87  70 - 99 (mg/dL)  VALPROIC ACID LEVEL      Component Value Range   Valproic Acid Lvl 81.1  50.0 - 100.0 (ug/mL)  GLUCOSE, CAPILLARY      Component Value Range   Glucose-Capillary 106 (*) 70 - 99 (mg/dL)   Comment 1 Notify RN    CBC      Component Value Range   WBC 5.9  4.0 - 10.5 (K/uL)   RBC 3.43 (*) 3.87 - 5.11 (MIL/uL)   Hemoglobin 11.2 (*) 12.0 - 15.0 (g/dL)   HCT 47.8 (*) 29.5 - 46.0 (%)   MCV 99.7  78.0 - 100.0 (fL)    MCH 32.7  26.0 - 34.0 (pg)   MCHC 32.7  30.0 - 36.0 (g/dL)   RDW 62.1  30.8 - 65.7 (%)   Platelets 209  150 - 400 (K/uL)  POCT I-STAT, CHEM 8      Component Value Range   Sodium 137  135 - 145 (mEq/L)   Potassium 4.3  3.5 - 5.1 (mEq/L)   Chloride 103  96 - 112 (mEq/L)   BUN 16  6 - 23 (mg/dL)   Creatinine, Ser 8.46  0.50 - 1.10 (mg/dL)   Glucose, Bld 96  70 - 99 (mg/dL)   Calcium, Ion 9.62  9.52 - 1.32 (mmol/L)   TCO2 28  0 - 100 (mmol/L)   Hemoglobin 11.9 (*) 12.0 - 15.0 (g/dL)   HCT 84.1 (*) 32.4 - 46.0 (%)  POCT I-STAT TROPONIN I      Component Value Range   Troponin i, poc 0.01  0.00 - 0.08 (ng/mL)   Comment 3              Dg Chest Portable 1 View  06/21/2011  *RADIOLOGY REPORT*  Clinical Data: Chest pain and shortness of breath.  PORTABLE CHEST - 1 VIEW  Comparison: 06/16/2011  Findings: Heart size and vascularity are normal and the lungs are clear.  No osseous abnormality.  IMPRESSION: Normal chest.  Original Report Authenticated By: Gwynn Burly, M.D.        Date: 06/21/2011  Rate: 68  Rhythm: normal sinus rhythm  QRS Axis: normal  Intervals: normal  ST/T Wave abnormalities: nonspecific ST changes  Conduction Disutrbances:none  Narrative Interpretation:   Old EKG Reviewed: unchanged    12:54 AM case discussed as above with Dr. Sharyn Lull who knows  patient very well. He recommends a 3 hour serial troponin and if that is negative, she can be discharged back to behavioral health.    MDM   Chest pain with a history of mild coronary artery disease. Psychiatric patient evaluated with EKG, chest x-ray and labs as above including serial troponins. Cardiology involved in patient is felt to be stable for discharge back to psychiatric inpatient hospital after evaluation. Her chest pain is resolved.        Sunnie Nielsen, MD 06/21/11 [phone]

## 2011-06-21 NOTE — ED Notes (Signed)
Spoke with [facility] to let them know that pt would be coming back soon via carelink.

## 2011-06-21 NOTE — Progress Notes (Signed)
In bed with eyes closed on approach. Opened eyes spontaneously to name. Appears flat and depressed. Oriented to place self and situation. Calm and cooperative with assessment. No acute distress noted. States she has had a good day. States she attended groups and learned that she needs to keep taking her meds and caring for herself after D/C.  Writer praised pt and reinforced need to remember those two important things. States she feels like she is ready for D/C and is hopeful MD will DC tomorrow. Encouraged to f/u with MD.  Otherwise offers no questions or concerns. Denies pain. Denies SI/HI/AVH and no psychotic/delusional comments made during assessment. Contracts for safety. POC and medications for the shift reviewed and understanding verbalized. Safety has been maintained with Q1minute observation. Will continue current POC.

## 2011-06-21 NOTE — Tx Team (Signed)
Interdisciplinary Treatment Plan Update (Adult)  Date:  06/21/2011  Time Reviewed:  10:19 AM   Progress in Treatment: Attending groups:  Yes Participating in groups:    Yes, can be intrusive - also is incontinent even in public sometimes Taking medication as prescribed:   Yes, no refusals  Tolerating medication:   Yes, no side effects reported by patient or noted by staff Family/Significant other contact made:  No, needed Patient understands diagnosis:   No, needs a guardian due to limited understanding of consequences of illness, symptoms, actions Discussing patient identified problems/goals with staff:   Yes, fully engaged Medical problems stabilized or resolved:   Yes Denies suicidal/homicidal ideation:  No, comes and goes depending on her delusions Issues/concerns per patient self-inventory:   None Other:  New problem(s) identified: Yes, Describe:  extremely malodorous  Reason for Continuation of Hospitalization: Delusions  Depression Hallucinations Medication stabilization Suicidal ideation  Interventions implemented related to continuation of hospitalization:  Medication monitoring and adjustment, safety checks Q15 min., group therapy, psychoeducation, collateral contact  Additional comments:  Not applicable  Estimated length of stay:  1-3 days  Discharge Plan:  Will return to her apartment with [name], and follow up with [facility]  New goal(s):  Not applicable  Review of initial/current patient goals per problem list:   1.  Goal(s):  Reduce depression from 10 to 3.  Met:  Yes  Target date:  By Discharge   As evidenced by:  Reports at a 0, but appears flat and depressed  2.  Goal(s):  Reduce psychotic symptoms to baseline.  Met:  No  Target date:  By Discharge   As evidenced by:  Remains grossly psychotic  3.  Goal(s):  Arrange with ACTT for follow up and to file for guardianship.  Met:  Yes  Target date:  By Discharge   As evidenced by:   Talked to Clemetine Marker about them picking her up and taking her to the office to get started with ACTT  4.  Goal(s):  Improve hygiene to socially acceptable level  Met:  No  Target date:  By Discharge   As evidenced by:  Strong odor of urine, needs a bath  Attendees: Patient:  BESSIE BOYTE  06/21/2011 11:23 AM   Family:     Physician:  Dr. Harvie Heck Readling 06/21/2011 11:23 AM   Nursing:   Robbie Louis, RN 06/21/2011   11:23 AM   Case Manager:  Ambrose Mantle, LCSW 06/21/2011  11:23 AM   Counselor:      Other:      Other:      Other:      Other:       Scribe for Treatment Team:   Sarina Ser, 06/21/2011, 10:19 AM

## 2011-06-21 NOTE — ED Notes (Signed)
Spoke with Minerva Areola, RN at [facility] to let him know that the pt will be coming back to the facility shortly.

## 2011-06-21 NOTE — Discharge Planning (Signed)
Met with patient in Aftercare Planning Group.   At her request, tried calling [facility].  Three numbers had been given -- all were wrong numbers or had been disconnected.  Ambrose Mantle, LCSW 06/21/2011, 1:28 PM

## 2011-06-21 NOTE — Progress Notes (Signed)
[facility] MD Progress Note  06/21/2011 2:56 PM  Diagnosis:  Axis I: Schizophrenia - Paranoid Type   The patient was seen today and reports the following:   ADL's: Impaired and smelled of urine.  Sleep: The patient reports to sleeping well last night.  Appetite: The patient reports a good appetite.   Mild>(1-10) >Severe  Hopelessness (1-10): 0  Depression (1-10): 0  Anxiety (1-10): 0   Suicidal Ideation: The patient adamantly denies any suicidal ideations today.  Plan: No  Intent: No  Means: No   Homicidal Ideation: The patient adamantly denies any homicidal ideations today.  Plan: No  Intent: No.  Means: No   General Appearance /Behavior: Casual and disshelved.  Eye Contact: Fair.  Speech: Appropriate in rate and volume. Much spontaneous speech today.  Motor Behavior: Mild psychomotor agitation.  Level of Consciousness: Alert and Oriented x 3.  Mental Status: AO x 3.  Mood: Mild to Moderately Manic.  Affect: Mild to Moderately Expansive.  Anxiety Level: None Reported.  Thought Process: Delusional.  Thought Content: The patient denies auditory hallucinations today as well as any visual hallucinations. States today that she is pregnant.  Perception: Mild to Moderately Delusional.  Judgment: Poor.  Insight: Poor.  Cognition: Orientation time, place and person.  Sleep:  Number of Hours: 5   Vital Signs:Blood pressure 145/106, pulse 91, temperature 98.2 F (36.8 C), temperature source Oral, resp. rate 20, height 5\' 2"  (1.575 m), weight 132.45 kg (292 lb), SpO2 93.00%.  Lab Results:  Results for orders placed during the hospital encounter of 06/17/11 (from the past 48 hour(s))  GLUCOSE, CAPILLARY     Status: Normal   Collection Time   06/19/11  4:45 PM      Component Value Range Comment   Glucose-Capillary 85  70 - 99 (mg/dL)   GLUCOSE, CAPILLARY     Status: Normal   Collection Time   06/19/11  9:05 PM      Component Value Range Comment   Glucose-Capillary 91  70 - 99  (mg/dL)   GLUCOSE, CAPILLARY     Status: Normal   Collection Time   06/20/11  6:28 AM      Component Value Range Comment   Glucose-Capillary 75  70 - 99 (mg/dL)   GLUCOSE, CAPILLARY     Status: Normal   Collection Time   06/20/11 11:27 AM      Component Value Range Comment   Glucose-Capillary 87  70 - 99 (mg/dL)   GLUCOSE, CAPILLARY     Status: Abnormal   Collection Time   06/20/11  4:52 PM      Component Value Range Comment   Glucose-Capillary 106 (*) 70 - 99 (mg/dL)    Comment 1 Notify RN     VALPROIC ACID LEVEL     Status: Normal   Collection Time   06/20/11  8:14 PM      Component Value Range Comment   Valproic Acid Lvl 81.1  50.0 - 100.0 (ug/mL)   CBC     Status: Abnormal   Collection Time   06/20/11 11:38 PM      Component Value Range Comment   WBC 5.9  4.0 - 10.5 (K/uL)    RBC 3.43 (*) 3.87 - 5.11 (MIL/uL)    Hemoglobin 11.2 (*) 12.0 - 15.0 (g/dL)    HCT 54.0 (*) 98.1 - 46.0 (%)    MCV 99.7  78.0 - 100.0 (fL)    MCH 32.7  26.0 - 34.0 (pg)  MCHC 32.7  30.0 - 36.0 (g/dL)    RDW 16.1  09.6 - 04.5 (%)    Platelets 209  150 - 400 (K/uL)   POCT I-STAT TROPONIN I     Status: Normal   Collection Time   06/20/11 11:50 PM      Component Value Range Comment   Troponin i, poc 0.01  0.00 - 0.08 (ng/mL)    Comment 3            POCT I-STAT, CHEM 8     Status: Abnormal   Collection Time   06/20/11 11:52 PM      Component Value Range Comment   Sodium 137  135 - 145 (mEq/L)    Potassium 4.3  3.5 - 5.1 (mEq/L)    Chloride 103  96 - 112 (mEq/L)    BUN 16  6 - 23 (mg/dL)    Creatinine, Ser 4.09  0.50 - 1.10 (mg/dL)    Glucose, Bld 96  70 - 99 (mg/dL)    Calcium, Ion 8.11  1.12 - 1.32 (mmol/L)    TCO2 28  0 - 100 (mmol/L)    Hemoglobin 11.9 (*) 12.0 - 15.0 (g/dL)    HCT 91.4 (*) 78.2 - 46.0 (%)   POCT I-STAT TROPONIN I     Status: Normal   Collection Time   06/21/11  3:05 AM      Component Value Range Comment   Troponin i, poc 0.00  0.00 - 0.08 (ng/mL)    Comment 3             GLUCOSE, CAPILLARY     Status: Normal   Collection Time   06/21/11 11:49 AM      Component Value Range Comment   Glucose-Capillary 98  70 - 99 (mg/dL)    Comment 1 Notify RN      Treatment Plan Summary:  1. Daily contact with patient to assess and evaluate symptoms and progress in treatment  2. Medication management  3. The patient will deny suicidal ideations or homicidal ideations for 48 hours prior to discharge and have a depression and anxiety rating of 3 or less. The patient will also deny any auditory or visual hallucinations or delusional thinking.   Plan:  1. Will continue current medications.  2. Will continue to monitor.  3. Will increase Thorazine to 25 mgs po q am and 2 pm and 50 mgs po qhs for delusions and mood stabilization.  [name] 06/21/2011, 2:56 PM

## 2011-06-22 LAB — GLUCOSE, CAPILLARY
Glucose-Capillary: 102 mg/dL — ABNORMAL HIGH (ref 70–99)
Glucose-Capillary: 100 mg/dL — ABNORMAL HIGH (ref 70–99)
Glucose-Capillary: 100 mg/dL — ABNORMAL HIGH (ref 70–99)

## 2011-06-22 MED ORDER — CHLORPROMAZINE HCL 50 MG PO TABS
50.0000 mg | ORAL_TABLET | Freq: Every day | ORAL | Status: DC
  Administered 2011-06-22 – 2011-06-28 (×7): 50 mg via ORAL
  Filled 2011-06-22 (×8): qty 1

## 2011-06-22 MED ORDER — NITROGLYCERIN 0.4 MG SL SUBL
0.4000 mg | SUBLINGUAL_TABLET | SUBLINGUAL | Status: DC | PRN
  Administered 2011-06-24: 0.4 mg via SUBLINGUAL

## 2011-06-22 MED ORDER — METOPROLOL TARTRATE 50 MG PO TABS
50.0000 mg | ORAL_TABLET | Freq: Two times a day (BID) | ORAL | Status: DC
  Administered 2011-06-23 – 2011-06-29 (×12): 50 mg via ORAL
  Filled 2011-06-22 (×14): qty 1

## 2011-06-22 MED ORDER — NITROFURANTOIN MONOHYD MACRO 100 MG PO CAPS
100.0000 mg | ORAL_CAPSULE | Freq: Two times a day (BID) | ORAL | Status: DC
  Administered 2011-06-23 – 2011-06-29 (×13): 100 mg via ORAL
  Filled 2011-06-22 (×15): qty 1

## 2011-06-22 MED ORDER — MAGNESIUM HYDROXIDE 400 MG/5ML PO SUSP: 30.0000 mL | Freq: Every day | ORAL | Status: DC | PRN

## 2011-06-22 MED ORDER — CHLORPROMAZINE HCL 25 MG PO TABS
25.0000 mg | ORAL_TABLET | Freq: Four times a day (QID) | ORAL | Status: DC | PRN
  Administered 2011-06-22 – 2011-06-24 (×2): 25 mg via ORAL
  Filled 2011-06-22 (×2): qty 1

## 2011-06-22 MED ORDER — ALUM & MAG HYDROXIDE-SIMETH 200-200-20 MG/5ML PO SUSP: 30.0000 mL | ORAL | Status: DC | PRN

## 2011-06-22 MED ORDER — HYDROCODONE-ACETAMINOPHEN 5-325 MG PO TABS
1.0000 | ORAL_TABLET | Freq: Four times a day (QID) | ORAL | Status: DC | PRN
  Administered 2011-06-22 – 2011-06-26 (×2): 2 via ORAL
  Filled 2011-06-22 (×2): qty 2

## 2011-06-22 MED ORDER — NITROGLYCERIN 0.4 MG SL SUBL: 0.4000 mg | SUBLINGUAL_TABLET | SUBLINGUAL | Status: DC | PRN

## 2011-06-22 MED ORDER — PANTOPRAZOLE SODIUM 40 MG PO TBEC
40.0000 mg | DELAYED_RELEASE_TABLET | Freq: Every day | ORAL | Status: DC
  Administered 2011-06-23 – 2011-06-29 (×7): 40 mg via ORAL
  Filled 2011-06-22 (×7): qty 1

## 2011-06-22 MED ORDER — ONDANSETRON HCL 4 MG PO TABS: 4.0000 mg | ORAL_TABLET | Freq: Three times a day (TID) | ORAL | Status: DC | PRN

## 2011-06-22 MED ORDER — DIVALPROEX SODIUM ER 500 MG PO TB24
1000.0000 mg | ORAL_TABLET | ORAL | Status: DC
  Administered 2011-06-22 – 2011-06-29 (×14): 1000 mg via ORAL
  Filled 2011-06-22 (×18): qty 2

## 2011-06-22 MED ORDER — LEVOTHYROXINE SODIUM 150 MCG PO TABS
150.0000 ug | ORAL_TABLET | ORAL | Status: DC
  Administered 2011-06-23 – 2011-06-29 (×7): 150 ug via ORAL
  Filled 2011-06-22 (×8): qty 1

## 2011-06-22 NOTE — Progress Notes (Signed)
Recreation Therapy Group Note  Date: 06/22/2011         Time: 0930      Group Topic/Focus: The focus of this group is on enhancing the patient's understanding of leisure, barriers to leisure, and the importance of engaging in positive leisure activities upon discharge for improved total health.  Participation Level: Minimal  Participation Quality: Attentive  Affect: Blunted  Cognitive: Alert   Additional Comments: Patient in and out of group as he was meeting with the doctor. Patient reports she is looking forward to discharge today.   Goldia Ligman 06/22/2011 11:22 AM
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
## 2011-06-22 NOTE — Progress Notes (Addendum)
Pt was up in dayroom upon first assessment.  She was given her self-inventory to fill out.  She denied any depression, hopelessness or anxiety today.  She stated,"well, I am anxious to go home that's all" She was still disruptive in group today but could be redirected better than yesterday.  She keeps asking for discharge but as the day has passed she is talking about leaving tomorrow.   She became irate right before lunch and Dr. Allena Katz ordered her a prn thorazine so given around 1136 which helped her a lot.  She calmed down and has not been a problem since.  We did discuss the thorazine and she admitted she had taking it some years back and it seemed to help her then.  She is compliant with her medications here.  The plan remains the same that the ACT team will pick her up when she is ready for discharge so she can complete the necessary paperwork that will allow them to follow her. She is in agreement with this plan but wants it to happen "now".  She has taken a shower today after told to this morning.  She remains incontinent of urine at times.    1830 Pt had been on phone with her fiance and became agitated with him over some type of power of attorney paperwork.  After she cursed him out and hung up the phone, she went to her room and started crying that she was having a heart attack and had only 6 months to live.  She wanted to be sent to the ED.  She stated,"y'all won't let me go home so I need to go to the emergency room for my chest pains" Raised the [facility] and checked her vs which were stable bp 114/77 pulse 72 resps 16 o2 sat 98 % on room air.  She was given 2 hydrocodone and this nurse stayed with pt and talked with her.  She did admit that she lied about the chest pains that she was mad at [name] and was wanting to go home.  She stated, "If you promise I can go home tomorrow then I will be good the rest of the night"  We discussed the story of the boy who cried wolf.  She again apologized for acting  like she was having a heart attack.  During this whole ordeal, pt never had any tears noted at all.  Will report to on coming shift pt's status. 1906  Pt is asleep eyes closed resps even and unlabored.

## 2011-06-22 NOTE — Progress Notes (Signed)
[facility] MD Progress Note  06/22/2011 1:31 PM  Diagnosis:  Axis I: Schizophrenia - Paranoid Type   The patient was seen today and reports the following:   ADL's: Impaired but clearer today.  Sleep: The patient reports to sleeping well last night.  Appetite: The patient reports a good appetite.   Mild>(1-10) >Severe  Hopelessness (1-10): 0  Depression (1-10): 0  Anxiety (1-10): 0   Suicidal Ideation: The patient adamantly denies any suicidal ideations today.  Plan: No  Intent: No  Means: No   Homicidal Ideation: The patient adamantly denies any homicidal ideations today.  Plan: No  Intent: No.  Means: No   General Appearance /Behavior: Casual and disshelved.  Eye Contact: Fair to Good.  Speech: Appropriate in rate and volume. Much spontaneous speech today.  Motor Behavior:  Appropriate today.  Level of Consciousness: Alert and Oriented x 3.  Mental Status: AO x 3.  Mood: Mildly Manic.  Affect: Mildly Expansive.  Anxiety Level: None Reported.  Thought Process: Delusional.  Thought Content: The patient denies auditory hallucinations today as well as any visual hallucinations. No delusions voiced today.  Perception: Mildly Delusional.  Judgment: Poor.  Insight: Poor.  Cognition: Orientation time, place and person.  Sleep:  Number of Hours: 6.5   Vital Signs:Blood pressure 131/94, pulse 83, temperature 96.9 F (36.1 C), temperature source Oral, resp. rate 18, height 5\' 2"  (1.575 m), weight 132.45 kg (292 lb), SpO2 93.00%.  Lab Results:  Results for orders placed during the hospital encounter of 06/17/11 (from the past 48 hour(s))  GLUCOSE, CAPILLARY     Status: Abnormal   Collection Time   06/20/11  4:52 PM      Component Value Range Comment   Glucose-Capillary 106 (*) 70 - 99 (mg/dL)    Comment 1 Notify RN     VALPROIC ACID LEVEL     Status: Normal   Collection Time   06/20/11  8:14 PM      Component Value Range Comment   Valproic Acid Lvl 81.1  50.0 - 100.0 (ug/mL)     CBC     Status: Abnormal   Collection Time   06/20/11 11:38 PM      Component Value Range Comment   WBC 5.9  4.0 - 10.5 (K/uL)    RBC 3.43 (*) 3.87 - 5.11 (MIL/uL)    Hemoglobin 11.2 (*) 12.0 - 15.0 (g/dL)    HCT 16.1 (*) 09.6 - 46.0 (%)    MCV 99.7  78.0 - 100.0 (fL)    MCH 32.7  26.0 - 34.0 (pg)    MCHC 32.7  30.0 - 36.0 (g/dL)    RDW 04.5  40.9 - 81.1 (%)    Platelets 209  150 - 400 (K/uL)   POCT I-STAT TROPONIN I     Status: Normal   Collection Time   06/20/11 11:50 PM      Component Value Range Comment   Troponin i, poc 0.01  0.00 - 0.08 (ng/mL)    Comment 3            POCT I-STAT, CHEM 8     Status: Abnormal   Collection Time   06/20/11 11:52 PM      Component Value Range Comment   Sodium 137  135 - 145 (mEq/L)    Potassium 4.3  3.5 - 5.1 (mEq/L)    Chloride 103  96 - 112 (mEq/L)    BUN 16  6 - 23 (mg/dL)    Creatinine,  Ser 1.10  0.50 - 1.10 (mg/dL)    Glucose, Bld 96  70 - 99 (mg/dL)    Calcium, Ion 1.61  1.12 - 1.32 (mmol/L)    TCO2 28  0 - 100 (mmol/L)    Hemoglobin 11.9 (*) 12.0 - 15.0 (g/dL)    HCT 09.6 (*) 04.5 - 46.0 (%)   POCT I-STAT TROPONIN I     Status: Normal   Collection Time   06/21/11  3:05 AM      Component Value Range Comment   Troponin i, poc 0.00  0.00 - 0.08 (ng/mL)    Comment 3            GLUCOSE, CAPILLARY     Status: Abnormal   Collection Time   06/21/11  6:34 AM      Component Value Range Comment   Glucose-Capillary 110 (*) 70 - 99 (mg/dL)   GLUCOSE, CAPILLARY     Status: Normal   Collection Time   06/21/11 11:49 AM      Component Value Range Comment   Glucose-Capillary 98  70 - 99 (mg/dL)    Comment 1 Notify RN     GLUCOSE, CAPILLARY     Status: Normal   Collection Time   06/21/11  5:08 PM      Component Value Range Comment   Glucose-Capillary 92  70 - 99 (mg/dL)   GLUCOSE, CAPILLARY     Status: Abnormal   Collection Time   06/21/11  8:43 PM      Component Value Range Comment   Glucose-Capillary 102 (*) 70 - 99 (mg/dL)   GLUCOSE,  CAPILLARY     Status: Normal   Collection Time   06/22/11  6:35 AM      Component Value Range Comment   Glucose-Capillary 74  70 - 99 (mg/dL)   GLUCOSE, CAPILLARY     Status: Abnormal   Collection Time   06/22/11 11:52 AM      Component Value Range Comment   Glucose-Capillary 102 (*) 70 - 99 (mg/dL)    Treatment Plan Summary:  1. Daily contact with patient to assess and evaluate symptoms and progress in treatment  2. Medication management  3. The patient will deny suicidal ideations or homicidal ideations for 48 hours prior to discharge and have a depression and anxiety rating of 3 or less. The patient will also deny any auditory or visual hallucinations or delusional thinking.   Plan:  1. Will continue current medications.  2. Will continue to monitor.  3. Will add Thorazine to 25 mgs po qid - prn for agitation.  Randy Readling 06/22/2011, 1:31 PM

## 2011-06-23 DIAGNOSIS — F319 Bipolar disorder, unspecified: Secondary | ICD-10-CM

## 2011-06-23 LAB — GLUCOSE, CAPILLARY: Glucose-Capillary: 93 mg/dL (ref 70–99)

## 2011-06-23 NOTE — Progress Notes (Signed)
Patient ID: [name], female   DOB: [birth year], 61 y.o.   MRN: [id number]  S: Met with Thai 1:1 today.  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  Says she is still living with her boyfriend who has a drug problem.  She doesn't think they can live together anymore because of this drug issue and thinks she'll have to kick him out of the house.  She is pleasant, denies any dangerous thoughts.  Rates her depression somewhere between a 0-2/10 if 10 is the worst symptoms.  Denies auditory hallucinations, denies homicidal thoughts, denies anxiety.    Says she slept well and appetite is good.   O:  Fully alert and disheveled in hospital gown.  Oriented to person and basic situation.  Thinking positive for delusional content.  Speech mildly pressured.  Concerns irrelevant.  Insight and judgment severely impaired.   AM CBG's are stable wnl.  A. Schizoaffective Disorder  P: Will continue current meds, and CM working on ACT team placement.

## 2011-06-23 NOTE — Discharge Planning (Signed)
Met with patient in Aftercare Planning Group.   She remains insistent upon discharge today.  Case Manager has continued to call [facility] re appointment to pick her up and take her to see doctor in order to start in their services, prior to taking her home from hospital.  We have exchanged phone calls for several days.  Today they inform that apparently as of 06/20/11, patient's Medicaid lapsed.  For that reason, they cannot start services with her, as they have a wait list for [facility] clients.  At this time, Case Manager has sent inquiry to another local ACTT team.  Per State Regulation 482.30  This chart was reviewed for medical necessity with respect to the patient's Admission/Duration of stay.   Next review due:  06/27/11   Ambrose Mantle, LCSW  06/23/2011  1:37 PM

## 2011-06-23 NOTE — Progress Notes (Signed)
[facility] Group Notes:  (Counselor/Nursing/MHT/Case Management/Adjunct)  06/23/2011 9:02 AM  Type of Therapy:  Group Therapy ( 06/22/11)  Participation Level:  Minimal  Participation Quality:  Intrusive and Monopolizing  Affect:  Angry  Cognitive:  Confused and Delusional  Insight:  None  Engagement in Group:  None  Engagement in Therapy:  None  Modes of Intervention:  Limit-setting and Orientation  Summary of Progress/Problems: patient kept insisting that she was being discharge today. She became angry when counselor told her differently. She eventually had to be removed from group because she could not be redirected.   Shaiden Aldous, Aram Beecham 06/23/2011, 9:02 AM
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
## 2011-06-24 LAB — GLUCOSE, CAPILLARY
Glucose-Capillary: 81 mg/dL (ref 70–99)
Glucose-Capillary: 95 mg/dL (ref 70–99)
Glucose-Capillary: 88 mg/dL (ref 70–99)
Glucose-Capillary: 107 mg/dL — ABNORMAL HIGH (ref 70–99)

## 2011-06-24 MED ORDER — LORAZEPAM 1 MG PO TABS
1.0000 mg | ORAL_TABLET | Freq: Once | ORAL | Status: AC
  Administered 2011-06-24: 1 mg via ORAL
  Filled 2011-06-24: qty 1

## 2011-06-24 NOTE — Progress Notes (Signed)
[facility] Group Notes:  (Counselor/Nursing/MHT/Case Management/Adjunct)  06/24/2011 11:28 AM  Type of Therapy:  Group therapy 06/23/2011  Participation Level:  Minimal  Participation Quality:  Resistant and Sharing  Affect:  Angry and Irritable  Cognitive:  Confused  Insight:  None  Engagement in Group:  None  Engagement in Therapy:  None  Modes of Intervention:  Clarification and Orientation  Summary of Progress/Problems: Patient kept insisting that she was being discharged today. She stated someone had told her that she was leaving in three days and didn't know why she couldn't leave. Counselor confronted her on condition that brought her to the hospital. She denied and blamed [name] for making up things. When challenged that emergency room had observed the same thing, she spoke delusionally about them putting something in her that was making all kinds of things come out. Eventually she had to leave group because she could not be redirected.   Summer Parthasarathy, Aram Beecham 06/24/2011, 11:28 AM

## 2011-06-24 NOTE — Progress Notes (Signed)
Pt sobbing in room when NP explained to pt that she could not discharge from hospital today. Pt then said that her chest was hurting and that she was dying. Vitals taken and WNL. NP listened to heart. PRN medications given for heart and anxiety. Safety maintained on unit.

## 2011-06-24 NOTE — Progress Notes (Addendum)
Patient ID: [name], female   DOB: March 17, 1951, 61 y.o.   MRN: [id number] Was found sleeping tonight, didn't attend karaoke, was a little hard to arouse from sleep, had turned heat up to 90.  After awake, didn't come out to dayroom, but asked for meds and snack to be brought to her. Took her hs meds, asked appropriate questions, was pleasant and cooperative.  Will continue to monitor.

## 2011-06-24 NOTE — Progress Notes (Signed)
[facility] Group Notes:  (Counselor/Nursing/MHT/Case Management/Adjunct)  06/24/2011 1:41 PM  Type of Therapy:  group therapy  Participation Level:  Did Not Attend   Veto Kemps 06/24/2011, 1:41 PM

## 2011-06-24 NOTE — Discharge Planning (Signed)
Met with patient in Aftercare Planning Group.   She is adamant she wants to leave today.  She did state that she has never had to renew her own Medicaid, but that was always done by [facility] staff.  Case Manager called, left message for her Medicaid worker to see if this process can be facilitated through hospital in order to enable patient to receive immediate services from [facility] as previously planned.  Also left another message for [facility] to discuss, and see if they can help to recertify her Medicaid and/or start services with her immediately anyway.  This stands in the way of her discharge currently.  Ambrose Mantle, LCSW 06/24/2011, 12:17 PM

## 2011-06-24 NOTE — Tx Team (Signed)
Interdisciplinary Treatment Plan Update (Adult)  Date:  06/24/2011  Time Reviewed:  8:55 AM   Progress in Treatment: Attending groups:  Yes Participating in groups:   Yes, less intrusive  Taking medication as prescribed:   Yes  Tolerating medication:   Yes Family/Significant other contact made:  No Patient understands diagnosis:   Limited understanding and insight Discussing patient identified problems/goals with staff:   Yes, focused on wanting to go home Medical problems stabilized or resolved:   Yes Denies suicidal/homicidal ideation:  Yes Issues/concerns per patient self-inventory:  None  Other:  New problem(s) identified: Yes, Describe:  ACTT that is supposed to follow up with patient now states that her Medicaid has lapsed so they cannot take her on as patient; Case Manager has attempted to contact Medicaid worker, had to leave message  Reason for Continuation of Hospitalization: Delusions  Medication stabilization Other; describe aftercare must be solidified because of patient's repeat hospitalization due to not complying with treatment in outpatient  Interventions implemented related to continuation of hospitalization:  Medication monitoring and adjustment, safety checks Q15 min., group therapy, psychoeducation, collateral contact  Additional comments:  Not applicable  Estimated length of stay:  3-4 days  Discharge Plan:  Return home, supposed to follow up with [facility], trying to see if they will get her Medicaid recertified instead of turning her down for services.  New goal(s):  Make determination about Medicaid recertification process in order to arrange aftercare ACTT  Review of initial/current patient goals per problem list:   1.  Goal(s):  Reduce depression from 10 to 3  Met:  Yes  Target date:  By Discharge   As evidenced by:  States is not depressed, but does appear to be  2.  Goal(s):  Reduce psychotic symptoms to baseline  Met:  No  Target date:  By  Discharge   As evidenced by:  Is grossly psychotic, delusional3  3.  Goal(s):  Arrange with ACTT for follow up and to file for guardianship  Met:  No  Target date:  By Discharge   As evidenced by:  Was previously arranged, but now problems have developed due to lapse of Medicaid  4.  Goal(s):  Improve hygiene to socially acceptable level  Met:  Yes  Target date:  By Discharge   As evidenced by:  Appears more groomed, no longer malodorous  Attendees: Patient:       Family:     Physician:     Nursing:   Martinsburg Cellar, RN 06/24/2011   10:45 am  Case Manager:  Ambrose Mantle, LCSW 06/24/2011  10:45 am  Counselor:  Veto Kemps, MT-BC 06/24/2011  10:45 am  Other:   Lynann Bologna, NP 06/24/2011 10:45 am  Other:      Other:      Other:       Scribe for Treatment Team:   Sarina Ser, 06/24/2011, 10:45 am

## 2011-06-24 NOTE — Progress Notes (Signed)
D: Pt didn't not attend wrap-up group.  Encouraged pt self hygiene with staff assistance.  Her CBG was 107.  Pt became agitated by yelling when redirect her to care for self-hygiene.  A:  Reported pt interactions with RN.  R:  Pt is safe. Aundria Rud, Amiliah Campisi L, MHT/NS

## 2011-06-24 NOTE — Progress Notes (Signed)
[facility] Group Notes:  (Counselor/Nursing/MHT/Case Management/Adjunct)  06/24/2011 1:40 PM  Type of Therapy:  Music therapy 06/23/2011  Participation Level:  Did Not Attend \   [name] 06/24/2011, 1:40 PM

## 2011-06-24 NOTE — Progress Notes (Signed)
Pt sad and depressed but cooperative. Pt remained in her bed most of shift. Pt lethargic. Pt repeatedly asked "I'm I going home today?", even though I explained to her that she was not. Pt denies SI/HI/AVH. Will continue Q15 min checks on pt for safety.

## 2011-06-24 NOTE — Progress Notes (Signed)
Patient ID: [name], female   DOB: 08/10/1950, 61 y.o.   MRN: [id number]   S:  [name] presents requesting to be discharged.  Says the only reason she is here is because [facility] put dye into her system and it was coming out in her urine.  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  She sobs and cannot be consoled when I explain to her that we do not feel that she is well enough to be discharged.    O: Disheveled and dressed in a hospital gown.  Positive for delusional thoughts.  Denies any suicidal thoughts.  Speech is pressured, she is sobbing loudly.  Will not gauge her depression or anxiety for me.    A:Schizoaffective disorder, bipolar type.    P: Initially the RN gave her prn order of 25mg  Thorazine.  Ultimately she calmed down with PO Ativan 1mg , and subsequently napped.    Will continue current plan.  Discussed in Team Meeting.  See Team Meeting Note.

## 2011-06-25 LAB — GLUCOSE, CAPILLARY

## 2011-06-25 NOTE — Progress Notes (Signed)
Patient ID: [name], female   DOB: 11-19-1950, 61 y.o.   MRN: [id number] Resting in bed with eyes closed. No distress noted.

## 2011-06-25 NOTE — Progress Notes (Signed)
[facility] MD Progress Note  06/25/2011 9:14 AM  Patient report during rounds this am, "I believe that I'm ready to go home. I have been here now x 4 weeks. I'm doing pretty well"  Diagnosis:   Axis I: Schizoaffective Disorder, bipolar type Axis II: Deferred Axis III:  Past Medical History  Diagnosis Date  . Schizoaffective disorder   . Urinary tract infection   . Hypertension   . Campath-induced atrial fibrillation   . GERD (gastroesophageal reflux disease)   . Hypothyroidism   . Myocardial infarction   . Diabetes mellitus   . Obesity   . Asthma   . COPD (chronic obstructive pulmonary disease)    Axis IV: No changes Axis V: 51-60 moderate symptoms  ADL's:  Impaired  Sleep:  6 hours  Appetite:  "I am eating well"  Suicidal Ideation:   Plan:  No  Intent:  No  Means:  No  Homicidal Ideation:   Plan:  No  Intent:  No  Means:  No    Mental Status: General Appearance /Behavior:  Casual Eye Contact:  Good Motor Behavior:  Normal Speech:  Normal Level of Consciousness:  Alert Mood:  Euthymic Affect:  Appropriate Anxiety Level:  Minimal Thought Process:  Coherent Thought Content:  Rumination Perception:  Fair Judgment:  Fair Insight:  Present Cognition:  Orientation place and person Sleep:  Number of Hours: 6   Vital Signs:Blood pressure 110/78, pulse 94, temperature 98.5 F (36.9 C), temperature source Oral, resp. rate 18, height 5\' 2"  (1.575 m), weight 292 lb (132.45 kg), SpO2 98.00%.  Lab Results:  Results for orders placed during the hospital encounter of 06/17/11 (from the past 48 hour(s))  GLUCOSE, CAPILLARY     Status: Normal   Collection Time   06/23/11 11:59 AM      Component Value Range Comment   Glucose-Capillary 87  70 - 99 (mg/dL)   GLUCOSE, CAPILLARY     Status: Normal   Collection Time   06/23/11  5:00 PM      Component Value Range Comment   Glucose-Capillary 94  70 - 99 (mg/dL)   GLUCOSE, CAPILLARY     Status: Normal   Collection Time   06/23/11  9:34 PM      Component Value Range Comment   Glucose-Capillary 85  70 - 99 (mg/dL)   GLUCOSE, CAPILLARY     Status: Normal   Collection Time   06/24/11  6:11 AM      Component Value Range Comment   Glucose-Capillary 81  70 - 99 (mg/dL)    Comment 1 Notify RN     GLUCOSE, CAPILLARY     Status: Normal   Collection Time   06/24/11 11:43 AM      Component Value Range Comment   Glucose-Capillary 95  70 - 99 (mg/dL)   GLUCOSE, CAPILLARY     Status: Normal   Collection Time   06/24/11  4:48 PM      Component Value Range Comment   Glucose-Capillary 88  70 - 99 (mg/dL)   GLUCOSE, CAPILLARY     Status: Abnormal   Collection Time   06/24/11  8:58 PM      Component Value Range Comment   Glucose-Capillary 107 (*) 70 - 99 (mg/dL)   GLUCOSE, CAPILLARY     Status: Normal   Collection Time   06/25/11  6:45 AM      Component Value Range Comment   Glucose-Capillary 91  70 - 99 (mg/dL)  Treatment Plan Summary: Daily contact with patient to assess and evaluate symptoms and progress in treatment Medication management  Plan: Will continue current treatment plan.  Armandina Stammer I 06/25/2011, 9:14 AM

## 2011-06-25 NOTE — Progress Notes (Signed)
Pt up this morning, attended breakfast and was up in the dayroom in the morning, pt had a big smile and spoke about ready to go home and spoke about getting married today and that she needed to get home to prepare for this, pt has not had any participation in milieu as pt went to room where she remained for much of the day, pt did not speak of discharge any longer of getting married but thoughts were still disorganized, support and encouragement provided during the day, will continue to monitor

## 2011-06-25 NOTE — Progress Notes (Signed)
[facility] Group Notes:  (Counselor/Nursing/MHT/Case Management/Adjunct)  06/25/2011 11 AM  Type of Therapy:  Group Therapy, Dance/Movement Therapy   Participation Level:  Did Not Attend   [name]  

## 2011-06-26 LAB — GLUCOSE, CAPILLARY: Glucose-Capillary: 125 mg/dL — ABNORMAL HIGH (ref 70–99)

## 2011-06-26 MED ORDER — ALBUTEROL SULFATE HFA 108 (90 BASE) MCG/ACT IN AERS
2.0000 | INHALATION_SPRAY | RESPIRATORY_TRACT | Status: DC | PRN
  Administered 2011-06-26: 2 via RESPIRATORY_TRACT
  Filled 2011-06-26: qty 6.7

## 2011-06-26 NOTE — Progress Notes (Signed)
Patient ID: [name], female   DOB: 03/03/1951, 61 y.o.   MRN: [id number] Sat up in chair in her room a fair amount this evening, very focused on discharge, asking about it every time a staff member went in the room.  At one point began crying and said she was having chest pain and wanted to go to the hospital, bp was normal, was warm and dry, eventually admitted she didn't have chest pain, just didn't want to be here anymore.  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 Singing loudly at times when others were trying to sleep, and got angry when was asked to stop.  Later she did c/o back pain and was medicated.  Went to bed afterward and slept for short time, now up in hall walking. Will continue to monitor.

## 2011-06-26 NOTE — Progress Notes (Signed)
Pt was up and visible this morning, pt did not attend morning group activities, spoke exclusively about being ready to leave today, stated repeatedly that she was ready to go home and that she had a lot of things to do when she got home and needed to do some planning, pt did speak about the person that she lives with and stated that the person does not love her or care about her since she has been here for four weeks and no one has come to visit her, pt did receive medications today without incident, support provided, will continue to monitor

## 2011-06-26 NOTE — Progress Notes (Signed)
Patient ID: [name], female   DOB: 02/15/1951, 61 y.o.   MRN: [id number]   [facility] Group Notes:  (Counselor/Nursing/MHT/Case Management/Adjunct)  06/26/2011 11 AM  Type of Therapy:  Group Therapy  Participation Level:  Did Not Attend  Neila Gear

## 2011-06-26 NOTE — Progress Notes (Signed)
[facility] MD Progress Note  06/26/2011 12:57 PM  Patient is seen during rounds. She was sitting up in the day room. She reports doing fines. Is in no apparent distress.  Diagnosis:   Axis I: Schizoaffective disorder, bipolar type Axis II: Deferred Axis III:  Past Medical History  Diagnosis Date  . Schizoaffective disorder   . Urinary tract infection   . Hypertension   . Campath-induced atrial fibrillation   . GERD (gastroesophageal reflux disease)   . Hypothyroidism   . Myocardial infarction   . Diabetes mellitus   . Obesity   . Asthma   . COPD (chronic obstructive pulmonary disease)    Axis IV: No changes. Axis V: 51-60 moderate symptoms  ADL's:  Impaired, requires assistance  Sleep:  2 hours   Appetite:  Yes,  AEB:  Suicidal Ideation:   Plan:  No  Intent:  No  Means:  No  Homicidal Ideation:   Plan:  No  Intent:  No  Means:  No  AEB (as evidenced by):  Mental Status: General Appearance [name]:  Casual Eye Contact:  Good Motor Behavior:  Normal Speech:  Normal Level of Consciousness:  Alert Mood:  Euphoric Affect:  Appropriate Anxiety Level:  Minimal Thought Process:  Disorganized Thought Content:  Delusions Perception:  Hallucinations Judgment:  Poor Insight:  Absent Cognition:  Orientation place and person Sleep:  Number of Hours: 2   Vital Signs:Blood pressure 111/77, pulse 77, temperature 97.9 F (36.6 C), temperature source Oral, resp. rate 17, height 5\' 2"  (1.575 m), weight 292 lb (132.45 kg), SpO2 95.00%.  Lab Results:  Results for orders placed during the hospital encounter of 06/17/11 (from the past 48 hour(s))  GLUCOSE, CAPILLARY     Status: Normal   Collection Time   06/24/11  4:48 PM      Component Value Range Comment   Glucose-Capillary 88  70 - 99 (mg/dL)   GLUCOSE, CAPILLARY     Status: Abnormal   Collection Time   06/24/11  8:58 PM      Component Value Range Comment   Glucose-Capillary 107 (*) 70 - 99 (mg/dL)   GLUCOSE,  CAPILLARY     Status: Normal   Collection Time   06/25/11  6:45 AM      Component Value Range Comment   Glucose-Capillary 91  70 - 99 (mg/dL)   GLUCOSE, CAPILLARY     Status: Normal   Collection Time   06/25/11 11:51 AM      Component Value Range Comment   Glucose-Capillary 86  70 - 99 (mg/dL)   GLUCOSE, CAPILLARY     Status: Normal   Collection Time   06/25/11  5:16 PM      Component Value Range Comment   Glucose-Capillary 96  70 - 99 (mg/dL)    Comment 1 Notify RN     GLUCOSE, CAPILLARY     Status: Abnormal   Collection Time   06/25/11  8:50 PM      Component Value Range Comment   Glucose-Capillary 118 (*) 70 - 99 (mg/dL)   GLUCOSE, CAPILLARY     Status: Abnormal   Collection Time   06/26/11  6:05 AM      Component Value Range Comment   Glucose-Capillary 107 (*) 70 - 99 (mg/dL)   GLUCOSE, CAPILLARY     Status: Abnormal   Collection Time   06/26/11 11:40 AM      Component Value Range Comment   Glucose-Capillary 125 (*) 70 - 99 (  mg/dL)    Comment 1 Notify RN      Treatment Plan Summary: Daily contact with patient to assess and evaluate symptoms and progress in treatment Medication management  Plan: Continue current treatment plan  Armandina Stammer I 06/26/2011, 12:57 PM

## 2011-06-26 NOTE — Progress Notes (Signed)
D:  Pt awake 90% of night.  Pt was redirected to shower to wash-up after bed and gown saturated in it.  In addition pt had behaviors of yelling that she was a Clinical research associate.  In addition, crying and yelling spells in room.  Pt difficult to redirect with these behaviors and stated wanting to go to [facility].  A:  Vitals taken and documented in EPIC.  Reported to RN.  R:  Pt is safe.  Aundria Rud, Azaiah Mello L, MHT/NS

## 2011-06-27 LAB — GLUCOSE, CAPILLARY
Glucose-Capillary: 103 mg/dL — ABNORMAL HIGH (ref 70–99)
Glucose-Capillary: 101 mg/dL — ABNORMAL HIGH (ref 70–99)

## 2011-06-27 NOTE — Progress Notes (Signed)
[facility] MD Progress Note  06/27/2011 4:05 PM  Diagnosis:   Axis I: Schizophrenia - Paranoid Type   The patient was seen today and reports the following:   ADL's: Slightly impaired today.  Sleep: The patient reports to sleeping well last night.  Appetite: The patient reports a good appetite.   Mild>(1-10) >Severe  Hopelessness (1-10): 0  Depression (1-10): 0  Anxiety (1-10): 0   Suicidal Ideation: The patient adamantly denies any suicidal ideations today.  Plan: No  Intent: No  Means: No  Homicidal Ideation: The patient adamantly denies any homicidal ideations today.  Plan: No  Intent: No.  Means: No   General Appearance /Behavior: Casual and slightly disshelved.  Eye Contact: Fair to Good.  Speech: Appropriate in rate and volume. No pressuring today.  Motor Behavior: Appropriate today.  Level of Consciousness: Alert and Oriented x 3.  Mental Status: AO x 3.  Mood: Essentially Euthymic.  Affect: Bright and Full.  Anxiety Level: None Reported.  Thought Process: Delusional at times but appropriate today.  Thought Content: The patient denies any auditory hallucinations today as well as any visual hallucinations. No delusions voiced today.  Perception: Episodically delusional particularly concerning being "5 months pregnant."  Judgment: Poor.  Insight: Poor.  Cognition: Orientation time, place and person.  Sleep:  Number of Hours: 6.5   Vital Signs:Blood pressure 135/88, pulse 108, temperature 98.1 F (36.7 C), temperature source Oral, resp. rate 18, height 5\' 2"  (1.575 m), weight 132.45 kg (292 lb), SpO2 95.00%.  Lab Results:  Results for orders placed during the hospital encounter of 06/17/11 (from the past 48 hour(s))  GLUCOSE, CAPILLARY     Status: Normal   Collection Time   06/25/11  5:16 PM      Component Value Range Comment   Glucose-Capillary 96  70 - 99 (mg/dL)    Comment 1 Notify RN     GLUCOSE, CAPILLARY     Status: Abnormal   Collection Time   06/25/11  8:50 PM        Component Value Range Comment   Glucose-Capillary 118 (*) 70 - 99 (mg/dL)   GLUCOSE, CAPILLARY     Status: Abnormal   Collection Time   06/26/11  6:05 AM      Component Value Range Comment   Glucose-Capillary 107 (*) 70 - 99 (mg/dL)   GLUCOSE, CAPILLARY     Status: Abnormal   Collection Time   06/26/11 11:40 AM      Component Value Range Comment   Glucose-Capillary 125 (*) 70 - 99 (mg/dL)    Comment 1 Notify RN     GLUCOSE, CAPILLARY     Status: Normal   Collection Time   06/26/11  5:27 PM      Component Value Range Comment   Glucose-Capillary 94  70 - 99 (mg/dL)   GLUCOSE, CAPILLARY     Status: Abnormal   Collection Time   06/26/11 10:10 PM      Component Value Range Comment   Glucose-Capillary 105 (*) 70 - 99 (mg/dL)    Comment 1 Notify RN     GLUCOSE, CAPILLARY     Status: Normal   Collection Time   06/27/11  5:58 AM      Component Value Range Comment   Glucose-Capillary 79  70 - 99 (mg/dL)   GLUCOSE, CAPILLARY     Status: Normal   Collection Time   06/27/11 11:37 AM      Component Value Range Comment  Glucose-Capillary 95  70 - 99 (mg/dL)    Treatment Plan Summary:  1. Daily contact with patient to assess and evaluate symptoms and progress in treatment  2. Medication management  3. The patient will deny suicidal ideations or homicidal ideations for 48 hours prior to discharge and have a depression and anxiety rating of 3 or less. The patient will also deny any auditory or visual hallucinations or delusional thinking.   Plan:  1. Will continue current medications.  2. Will continue to monitor.  3. Probable discharge on Wednesday when her ACT team can arrange follow-up. 4. Will repeat UA to make sure that her UTI has cleared.  Randy Readling 06/27/2011, 4:05 PM

## 2011-06-27 NOTE — Progress Notes (Signed)
On self inventory sheet, patient stated she sleeps well, has good appetite, high energy level, good attention span.  Denied depression, hopelessness, withdrawals, SI, no physical problems.  Plans to take meds and better care of herself after discharge.  No questions for staff.   Has discharge plans.  No problems taking meds after discharge. Patient did not go to lunch or dinner in dining room, refused to take 1700 meds.

## 2011-06-27 NOTE — Discharge Planning (Addendum)
Met with patient in Aftercare Planning Group.   She is eager to discharge.  However, upon talking to different ACT Team about setting up services with her, they agreed that she should stay in hospital until services can be established, as she has twice before missed out on ACTT services due to not following up.  They will pick her up at 1:00 on 06/29/11 to take her for intake at their office with Shanda Bumps, then she will see Dr. Hortencia Pilar at 2:00.  Case Manager completed referral, sent to [facility] for this ACTT follow-up.  Ambrose Mantle, LCSW 06/27/2011, 2:20 PM  Per State Regulation 482.30  This chart was reviewed for medical necessity with respect to the patient's Admission/Duration of stay.   Next review due:  06/30/11   Ambrose Mantle, LCSW  06/27/2011  3:05 PM

## 2011-06-27 NOTE — Progress Notes (Signed)
In bed with eyes closed on approach. Opened eyes spontaneously to name. Appears flat and depressed. Minimal with responses. No acute distress noted. States she has had a good day and wants to go home. Asked Clinical research associate if she was going home tomorrow. Redirected back to treatment team in AM, but did say that there was discussion about potential D/C Wednesday if pt continues to do well. When asked what will be different, States she intends to take her medication and allow people to help her. Support and encouragement provided. Otherwise no additioanl questions or concerns expressed. Denies pain or discomfort. Denies SI/HI/AVH and contracts for safety. No psychotic or delusional comments made. POC and medications for the shift reviewed and understanding verbalized. Safety has been maintained with Q32minute observation. Will continue current POC.

## 2011-06-27 NOTE — Progress Notes (Signed)
Recreation Therapy Notes  06/27/2011         Time: 0930      Group Topic/Focus: The focus of this group is on enhancing patients' problem solving skills, which involves identifying the problem, brainstorming solutions and choosing and trying a solution.   Participation Level: Active  Participation Quality: Attentive  Affect: Appropriate  Cognitive: Alert  Additional Comments: Patient reports she is looking forward to discharge today.  Giovoni Bunch 06/27/2011 1:07 PM

## 2011-06-27 NOTE — Progress Notes (Signed)
Patient ID: [name], female   DOB: December 19, 1950, 61 y.o.   MRN: [id number] Has been in bed most of the evening, was found sleeping, had been incontinent of urine, and was assisted out of bed and to bathroom.  She refused a shower, but did wash herself, bed stripped and wiped down. Remains focused on going home, calling out that she wants to go home, offered a big check if I would get her out of here.  Denied discomfort at this time, wanted to go to dayroom for snack but wanted to be taken there in w/c, and was taken by tech. Somewhat irritable at times, somewhat confused, went back to bed and fell asleep shortly after. Will continue to monitor.

## 2011-06-27 NOTE — Tx Team (Signed)
Interdisciplinary Treatment Plan Update (Adult)  Date:  06/27/2011  Time Reviewed:  10:30AM  Progress in Treatment: Attending groups:  Yes Participating in groups:    Yes Taking medication as prescribed: Yes    Tolerating medication:   Yes Family/Significant other contact made:  No Patient understands diagnosis:  Yes, with limited insight and poor judgment    Discussing patient identified problems/goals with staff:   Yes, particularly discussing desire to discharge Medical problems stabilized or resolved:   Yes Denies suicidal/homicidal ideation:  Yes Issues/concerns per patient self-inventory:   None  Other:  New problem(s) identified: No, Describe:    Reason for Continuation of Hospitalization: Delusions  Other; describe want patient to go straight into ACTT services  Interventions implemented related to continuation of hospitalization:  Medication monitoring and adjustment, safety checks Q15 min., group therapy, psychoeducation, collateral contact  Additional comments:  Not applicable  Estimated length of stay:  1-2 days  Discharge Plan:  Go home, follow-up with [facility]  New goal(s):  Not applicable  Review of initial/current patient goals per problem list:   1.  Goal(s):  Reduce depression from 10 to 3  Met:  Yes  Target date:  By Discharge   As evidenced by:  States is not at all depressed  2.  Goal(s):  Reduce psychotic symptoms to baseilne.  Met:  Yes  Target date:  By Discharge   As evidenced by:  While still actively psychotic, is likely at baseline  3.  Goal(s):  Arrange with ACTT for follow-up and to file for guardianship  Met:  No  Target date:  By Discharge   As evidenced by:  Needed, inquiries have been put forth, awaiting responses  4.  Goal(s):  Improve hygiene to socially acceptable level  Met:  No  Target date:  By Discharge   As evidenced by:  Still incontinent of urine, not recognizing this herself, not changing clothes unless  prompted continually  4.  Goal(s):  Make determination about Medicaid recertification process in order to arrange aftercare ACTT.  Met:  Yes  Target date:  By Discharge   As evidenced by:  Medicare worker and ACTT when in place can do this.   Attendees: Patient:  Did not attend 30AM  Family:     Physician:  Dr. Harvie Heck Readling 06/27/2011 10:30AM  Nursing:   Robbie Louis, RN 06/27/2011   10:30AM  Case Manager:  Ambrose Mantle, LCSW 06/27/2011  10:30AM  Counselor:  Veto Kemps, MT-BC 06/27/2011  10:30AM  Other:   Quintella Reichert, RN 06/27/2011 10:  Other:      Other:      Other:       Scribe for Treatment Team:   Sarina Ser, 06/27/2011, 10:30AM

## 2011-06-28 LAB — URINALYSIS, ROUTINE W REFLEX MICROSCOPIC
Glucose, UA: NEGATIVE mg/dL
Hgb urine dipstick: NEGATIVE
Protein, ur: NEGATIVE mg/dL

## 2011-06-28 LAB — GLUCOSE, CAPILLARY: Glucose-Capillary: 104 mg/dL — ABNORMAL HIGH (ref 70–99)

## 2011-06-28 NOTE — Progress Notes (Signed)
[facility] Group Notes:  (Counselor/Nursing/MHT/Case Management/Adjunct)  06/28/2011 3:56 PM  Type of Therapy:  Group therapy  Participation Level:  Minimal  Participation Quality:  Attentive and Sharing  Affect:  Appropriate  Cognitive:  Oriented  Insight:  Limited  Engagement in Group:  Good  Engagement in Therapy:  Limited  Modes of Intervention:  Clarification, Problem-solving and Support  Summary of Progress/Problems: patient spoke of her discharge on Wednesday. She was more oriented today. Affect brighter and able to stay with discussion. Requested some clothes to wear.   HartisAram Beecham 06/28/2011, 3:56 PM

## 2011-06-28 NOTE — Progress Notes (Signed)
Patient stated on self inventory sheet that she sleeps well, has good appetite, high energy level, good attention span.  Denied depression, hopelessness, withdrawals, SI, no physical problems.  After discharge, plans to take meds and take better care of herself.  No questions for staff.  Is aware of discharge plans.  No problems taking meds after discharge.

## 2011-06-28 NOTE — Progress Notes (Signed)
In bed with eyes closed on approach. Opened eyes spontaneously to name. Appears flat. Calm and cooperative with assessment. No acute distress noted. States she has had a good day r/t getting the news she would be leaving tomorrow for sure (baring any acute changes). States she is ready to go and is excited to see her boyfriend, [name]. When asked what would be different this time, replied that she was going to take her medication and make sure to make all of her appointments. When asked who was going to be her biggest support, replied her ACTT. Support and encouragement provided. No questions or concerns expressed. Denies SI/HI/AVH and contracts for safety. Denies pain/discomfort. No psychotic or delusional statements made during assessment. POC and medications for the shift reviewed and understanding verbalized. Safety has been maintained with Q64minute observation. Will continue the current POC.

## 2011-06-28 NOTE — Progress Notes (Signed)
Patient ID: [name], female   DOB: 06-16-1951, 61 y.o.   MRN: [id number]  S: [name] is dressed again in a hospital gown and says her boyfriend [name] cannot bring her any clothing or pick her up from the hospital because he has had a motor vehicle collision and has no transportation.  [name] believes she is scheduled to go home today and understands that an ACT team member will pick her up and immediately take her to an outpatient appointment and she is in agreement with this plan.    [name] races through her daily report to me, anticipating all my questions - stating her depression is a zero, she's sleeping great, her appetite is good and she has no dangerous ideas - she only wants to go home.    We discussed the plan in team meeting and all members are in agreement that she is ready for discharge.  The ACT team will pick her up tomorrow, rather than today, based on their scheduled needs.

## 2011-06-28 NOTE — Discharge Planning (Signed)
Met with patient in Aftercare Planning Group.   She was made aware of the change in plan to a different ACT Team, and that they will pick her up on 06/29/11 to transport for intake at their office, meeting with doctor, then transport home.  Patient smelled strongly of urine.  She needs to bathe before being able to be released into custody of ACTT and get into their car.  Ambrose Mantle, LCSW 06/28/2011, 4:01 PM

## 2011-06-29 LAB — GLUCOSE, CAPILLARY: Glucose-Capillary: 100 mg/dL — ABNORMAL HIGH (ref 70–99)

## 2011-06-29 MED ORDER — DIVALPROEX SODIUM ER 500 MG PO TB24: 1000.0000 mg | ORAL_TABLET | ORAL | Status: DC

## 2011-06-29 MED ORDER — CHLORPROMAZINE HCL 25 MG PO TABS: ORAL_TABLET | ORAL | Status: DC

## 2011-06-29 MED ORDER — PALIPERIDONE ER 6 MG PO TB24: 6.0000 mg | ORAL_TABLET | ORAL | Status: DC

## 2011-06-29 MED ORDER — CHLORPROMAZINE HCL 25 MG PO TABS: 25.0000 mg | ORAL_TABLET | ORAL | Status: DC

## 2011-06-29 MED ORDER — NITROFURANTOIN MONOHYD MACRO 100 MG PO CAPS: 100.0000 mg | ORAL_CAPSULE | Freq: Two times a day (BID) | ORAL | Status: AC

## 2011-06-29 NOTE — Progress Notes (Signed)
[facility] Case Management Discharge Plan:  Will you be returning to the same living situation after discharge: Yes,  has apartment with boyfriend Would you like a referral for services when you are discharged:Yes,  willing to go to an ACT Team. Do you have access to transportation at discharge:Yes,  Will be picked up by [facility] Do you have the ability to pay for your medications:Yes,  Has insurance, disability check  Interagency Information:   Release of Information forms completed to be signed by patient at discharge.  Patient to Follow up at:  Follow-up Information    Follow up with [facility].      [facility] up with [facility] on 06/29/2011. (1:30PM Intake with Shanda Bumps for admission to program)    Contact information:   [address] [place] Telephone:  [phone]      Follow up with [facility] on 06/29/2011. (2:00PM appointment with Dr. Hortencia Pilar for medication management)    Contact information:   [address] [place] Telephone:  [phone]         Patient denies SI/HI:   Yes,      Safety Planning and Suicide Prevention discussed:  Yes,   During Aftercare Planning Groups, Case Manager has provided psychoeducation on "Suicide Prevention Information."  This included descriptions of risk factors for suicide, warning signs that an individual is in crisis and thinking of suicide, and what to do if this occurs.  Pt indicated understanding of information provided, and will read brochure given upon discharge.     Barrier to discharge identified:No.  Summary and Recommendations:  Patient had previously had less intensive outpatient services, and when referred to ACTT for intensive follow-ups, did not go to the intake when scheduled.  Needs ACTT in order to stay out of the hospital.   Sarina Ser 06/29/2011, 11:18 AM

## 2011-06-29 NOTE — Progress Notes (Signed)
Suicide Risk Assessment  Discharge Assessment     Demographic factors:  Assessment Details Time of Assessment: Discharge Information Obtained From: Patient Current Mental Status:   AO x 3. Risk Reduction Factors:     CLINICAL FACTORS:   Schizophrenia:   Paranoid or undifferentiated type Unstable or Poor Therapeutic Relationship Previous Psychiatric Diagnoses and Treatments  COGNITIVE FEATURES THAT CONTRIBUTE TO RISK:  Thought constriction (tunnel vision)    Diagnosis:  Axis I: Schizophrenia - Paranoid Type   The patient was seen today and reports the following:   ADL's: Slightly impaired today but at baseline.  Sleep: The patient reports to sleeping well last night.  Appetite: The patient reports a good appetite.   Mild>(1-10) >Severe  Hopelessness (1-10): 0  Depression (1-10): 0  Anxiety (1-10): 0   Suicidal Ideation: The patient adamantly denies any suicidal ideations today.  Plan: No  Intent: No  Means: No   Homicidal Ideation: The patient adamantly denies any homicidal ideations today.  Plan: No  Intent: No.  Means: No   General Appearance /Behavior: Casual and slightly disshelved.  Eye Contact: Good.  Speech: Appropriate in rate and volume. No pressuring today.  Motor Behavior: Appropriate today.  Level of Consciousness: Alert and Oriented x 3.  Mental Status: AO x 3.  Mood: Essentially Euthymic.  Affect: Bright and Full.  Anxiety Level: None Reported.  Thought Process: Delusional at times but appropriate today. Recently stated that she owns a [employer] (medical) is invited to use it for a 2 month cruise.  Thought Content: The patient denies any auditory hallucinations today as well as any visual hallucinations. No delusions voiced today.  Perception: Episodically delusional. Judgment: Fair.  Insight: Fair.  Cognition: Orientation time, place and person.   Treatment Plan Summary:  1. Daily contact with patient to assess and evaluate symptoms and  progress in treatment  2. Medication management  3. The patient will deny suicidal ideations or homicidal ideations for 48 hours prior to discharge and have a depression and anxiety rating of 3 or less. The patient will also deny any auditory or visual hallucinations or delusional thinking.   Plan:  1. Will continue current medications.  2. Will continue to monitor.  3. Will discharge today when her ACT team arrives and will see the ACT team Psychiatrist today.  SUICIDE RISK:   Minimal: No identifiable suicidal ideation.  Patients presenting with no risk factors but with morbid ruminations; may be classified as minimal risk based on the severity of the depressive symptoms   [name] 06/29/2011, 12:10 PM

## 2011-06-29 NOTE — Progress Notes (Signed)
Pt d/c from hospital with ACT team. All items returned. D/C instructions given and prescriptions given. Pt denies si and hi.

## 2011-06-29 NOTE — Progress Notes (Signed)
Recreation Therapy Notes  06/29/2011         Time: 0930      Group Topic/Focus: The focus of this group is on enhancing the patient's understanding of leisure, barriers to leisure, and the importance of engaging in positive leisure activities upon discharge for improved total health.  Participation Level: Active  Participation Quality: Appropriate  Affect: Appropriate  Cognitive: Oriented   Additional Comments: Patient focused on discharge today.   Varshini Arrants 06/29/2011 11:57 AM

## 2011-06-29 NOTE — Discharge Summary (Signed)
  Identifying information: This is a 61 year old Native American female, as was an involuntary admission.  Date of admission: 06/17/2011. Date of discharge: 06/29/2011.  Discharge diagnosis:  Axis I: Schizoaffective disorder bipolar type. Axis II: Deferred. Axis III: Hypertension obstructive sleep apnea, UTI. Hypothyroidism, obesity Axis IV: Issues with burden of illness and poor mental health followup. Axis V: Current 55 past year not known.  Discharge medications:  Amlodipine 5 mg 1 tablet daily for blood pressure. Aspirin 81 mg by mouth daily for heart health. Depakote ER 1000 mg by mouth twice daily in the morning and at bedtime for mood stability. Ferrous sulfate 325 mg twice daily with meal, for iron supplement. Folic acid 1 mg daily, vitamin supplement. Levothyroxine 150 mcg. Daily. For thyroid supplement. Metoprolol tartrate 50 mg 2 times daily. For high blood pressure. Omeprazole 20 mg 2 times daily for GERD. [name] 6 mg twice daily in the morning and at bedtime for clear thoughts. Macrobid 100 mg every 12 hours until completed, for 7 more days, for UTI. Thorazine 25 mg twice daily and 50 mg at bedtime, for improved sleep and calm thoughts.  Please note that Melida is being discharged on 2 antipsychotic medications, she has failed least 3 tries a previous monotherapy, we are recommending her outpatient psychiatrist consider tapering down on her Thorazine and continuing [name].  Consultations: None  Course of hospitalization:  That he was admitted with an exacerbation of psychosis and mood disorder. She presented hyperverbal with grandiose ideas, believing that she was in charge of the [employer] insisting she needed to get home to write checks. She had not been taking medications consistently and had not kept followup appointments.  We reinitiated her previous medications to which she responded well. Her stay here was uneventful. She did state that  [name] her live-in friend, had been in a motor vehicle collision and was unable to bring her clothing or take her home.  Her case manager worked to get a recur into a followup act team to help with medication compliance and close monitoring.   Geraldine is at her baseline state today coherent with mild mildly grandiose thoughts but no dangerous ideas and is cooperative with the idea of being seen by the act team. While here, her interactions with peers and staff were unremarkable and she attended group therapy with satisfactory participation.   She is discharged to be picked up at 1 PM today at the [facility] act team to see Dr. De Nurse M.D. She is discharged with 30 day prescriptions of her psychiatric medications and a seven-day supply.

## 2011-06-29 NOTE — Tx Team (Signed)
Interdisciplinary Treatment Plan Update (Adult)  Date:  06/29/2011  Time Reviewed:  10:23 AM   Progress in Treatment: Attending groups:  Yes Participating in groups:    Yes Taking medication as prescribed:    Yes Tolerating medication:   Yes Family/Significant other contact made:  No, but done last admission   Patient understands diagnosis:   Yes, with limited insight and poor judgment Discussing patient identified problems/goals with staff:   Yes Medical problems stabilized or resolved:   Yes Denies suicidal/homicidal ideation:  Yes Issues/concerns per patient self-inventory:   None Other:  New problem(s) identified: No, Describe:    Reason for Continuation of Hospitalization:  NONE   Interventions implemented related to continuation of hospitalization:  Medication monitoring and adjustment, safety checks Q15 min., group therapy, psychoeducation, collateral contact - until discharge  Additional comments:  Not applicable  Estimated length of stay:  Discharge today  Discharge Plan:  Pick up by [facility], transport to do intake to start with [facility] - will return to her home where she lives with boyfriend  New goal(s):  Not applicable  Review of initial/current patient goals per problem list:   1.  Goal(s):  Reduce depression from 10 to 3.  Met:  Yes  Target date:  By Discharge   As evidenced by:  "0" today  2.  Goal(s):  Reduce psychotic symptoms to baseline.  Met:  Yes  Target date:  By Discharge   As evidenced by:  Still delusional, at baseline  3.  Goal(s):  Arrange with ACTT for follow-up and to file for guardianship.  Met:  Yes  Target date:  By Discharge   As evidenced by:  [facility] will follow up with [facility] services and will determine as they gain time with her whether she needs them to pursue guardianship  4.  Goal(s):  Improve hygiene to socially acceptable level.  Met:  Yes  Target date:  By Discharge   As evidenced by:  As much as possible - given bladder  issues  5.  Goal(s):  Make determination about Medicaid recertification process in order to arrange aftercare ACT[facility].  Met:  Yes  Target date:  By Discharge   As evidenced by:  Educated patient re how to recertify her Medicaid, what is needed by tomorrow to reopen case.  Attendees: Patient:  [name]  06/29/2011 11:10 AM   Family:     Physician:  Dr. Harvie Heck Readling 06/29/2011 11:10 AM   Nursing:   Robbie Louis, RN 06/29/2011   11:11 AM   Case Manager:  Ambrose Mantle, LCSW 06/29/2011  11:11 AM   Counselor:  Lynann Bologna, NP   06/29/2011 11:10 AM   Other:      Other:      Other:      Other:       Scribe for Treatment Team:   Sarina Ser, 06/29/2011, 10:23 AM

## 2011-07-05 NOTE — Progress Notes (Signed)
Patient Discharge Instructions:  No consent for [facility]  Wandra Scot, 07/05/2011, 10:25 AM

## 2011-07-11 IMAGING — CR DG CHEST 1V PORT
1 series · 1 of 1 positions shown · non-contrast
Comparison: Chest radiograph performed 02/18/2010

CLINICAL DATA: Shortness of breath and chest pain; diaphoresis.

PORTABLE CHEST - 1 VIEW

[AP]
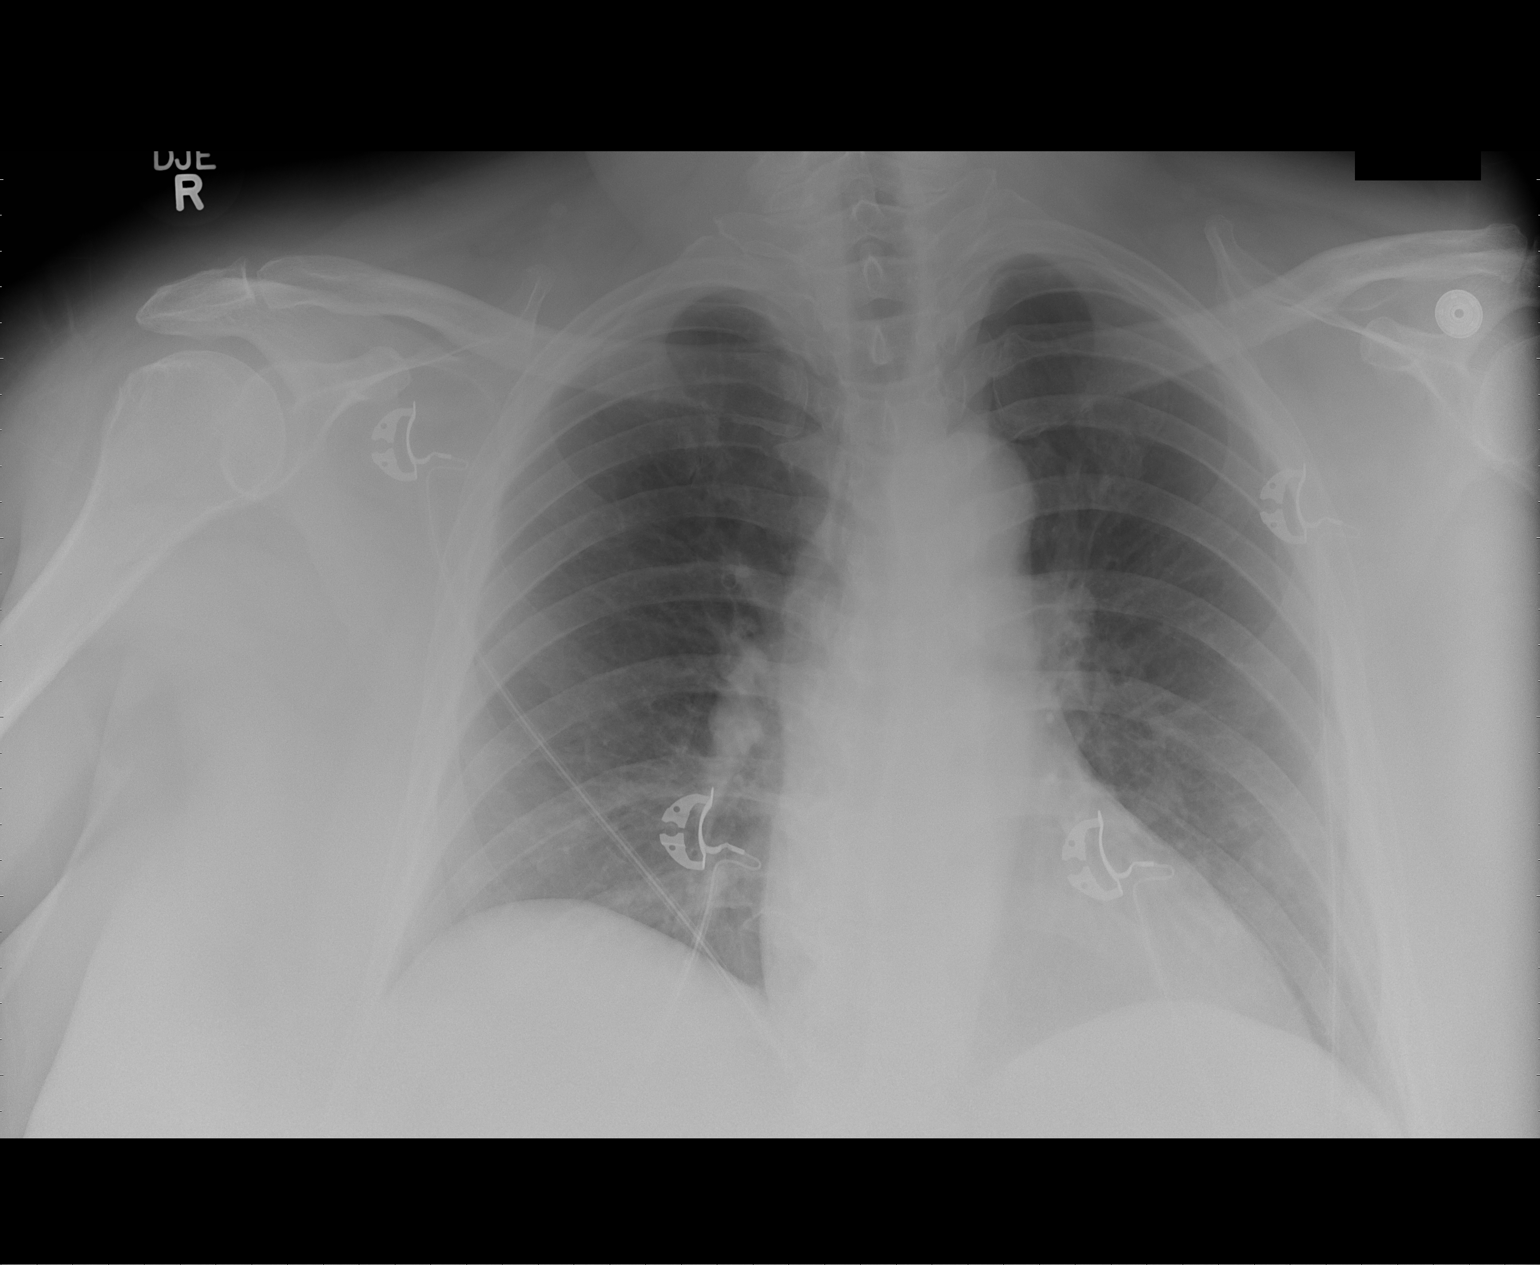

[1 of 1 positions shown; findings below may reference images not displayed]

FINDINGS: The lungs are well-aerated and clear.  There is no
evidence of focal opacification, pleural effusion or pneumothorax.

The cardiomediastinal silhouette is within normal limits.  No acute
osseous abnormalities are seen.
IMPRESSION: No acute cardiopulmonary process seen.

## 2011-07-27 IMAGING — CR DG CHEST 2V
1 series · 1 of 1 positions shown · non-contrast
Comparison: 03/13/2010

CLINICAL DATA: Fell/chest pain/short of breath

CHEST - 2 VIEW

[view not recorded]
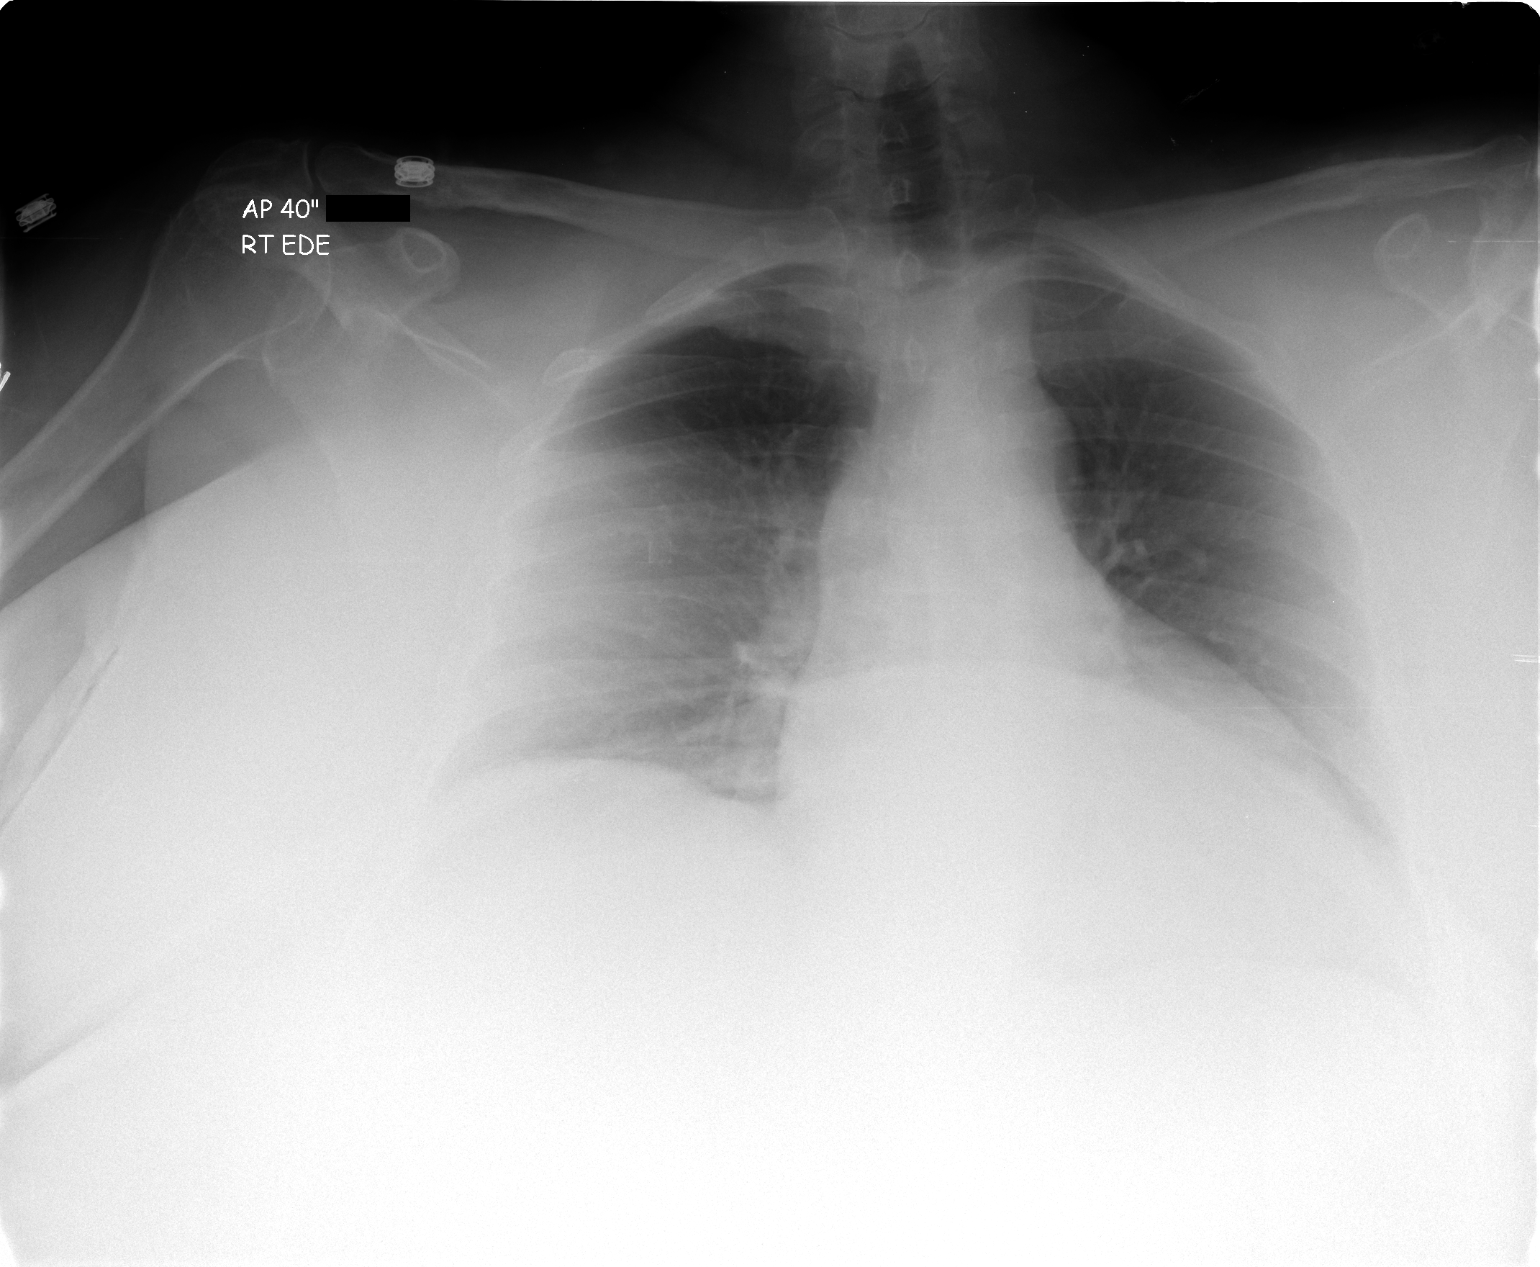

[1 of 1 positions shown; findings below may reference images not displayed]

FINDINGS: AP semi-erect and lateral views were obtained.  The study
is limited technically by large body habitus.  No definite
congestive heart failure or active disease.  No pleural fluid or
fractures.
IMPRESSION: Technically limited due to large body habitus- no active disease
apparent.

## 2011-07-27 IMAGING — CT CT HEAD W/O CM
1 of 2 series · 16 of 30 positions shown, 20 images · non-contrast
Comparison: 12/26/2009

CLINICAL DATA: Syncope.  Fell and hit head.

CT HEAD WITHOUT CONTRAST
TECHNIQUE: Contiguous axial images were obtained from the base of
the skull through the vertex without contrast.

[Series 3: recon 2: brain · axial · 0.47mm/px · z∈[+124,+259]mm · 16 of 56 slices shown, 20 images]
[im 3/56  brain]
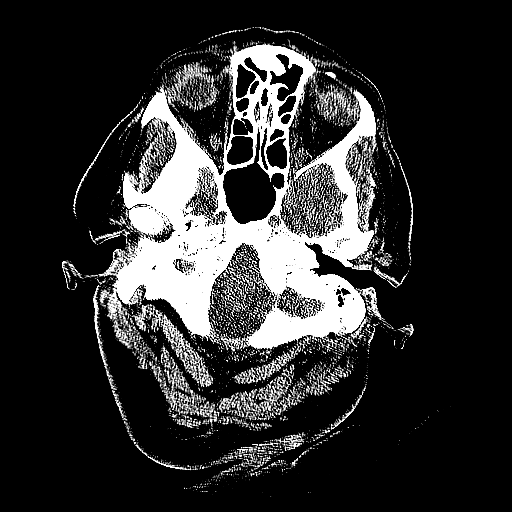
[im 3/56  bone]
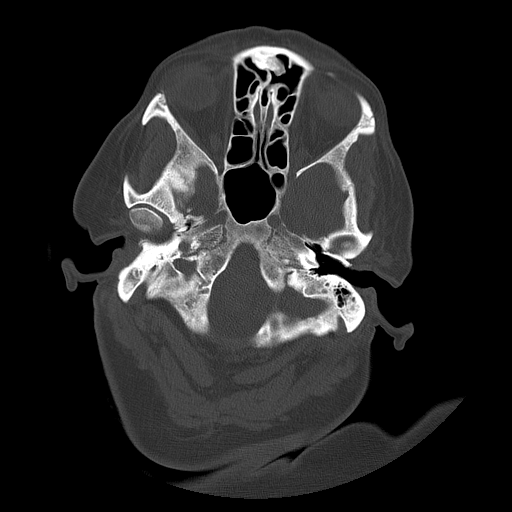
[im 6/56  brain]
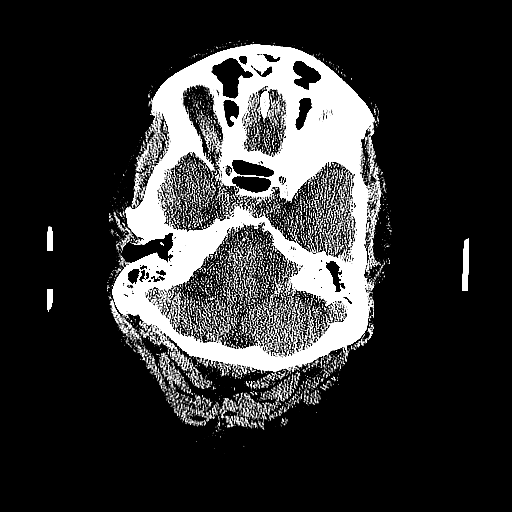
[im 9/56  brain]
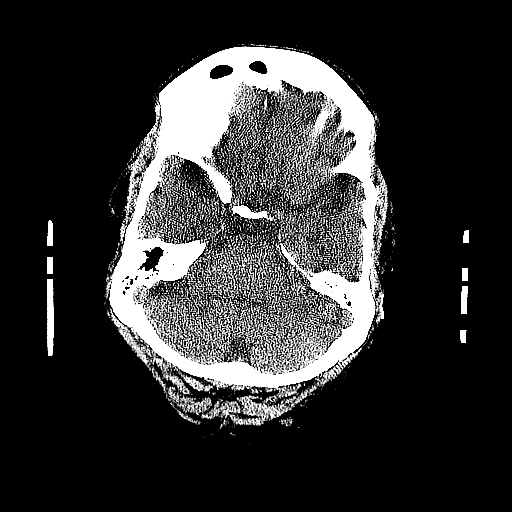
[im 12/56  brain]
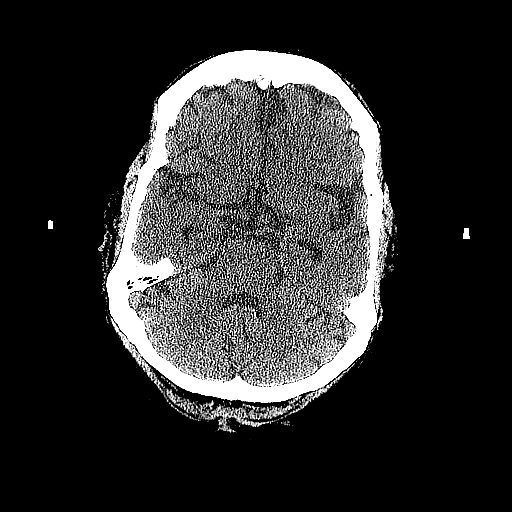
[im 15/56  brain]
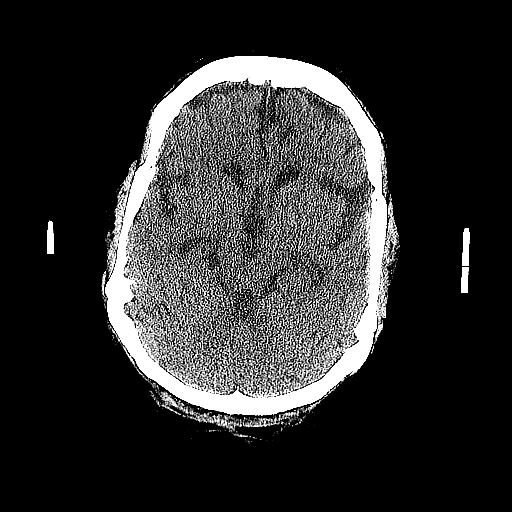
[im 15/56  bone]
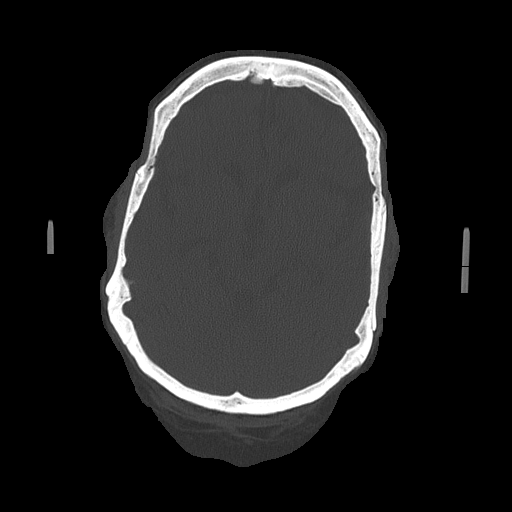
[im 18/56  brain]
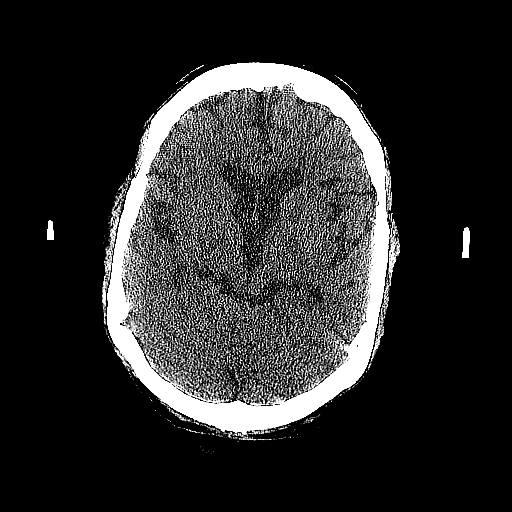
[im 21/56  brain]
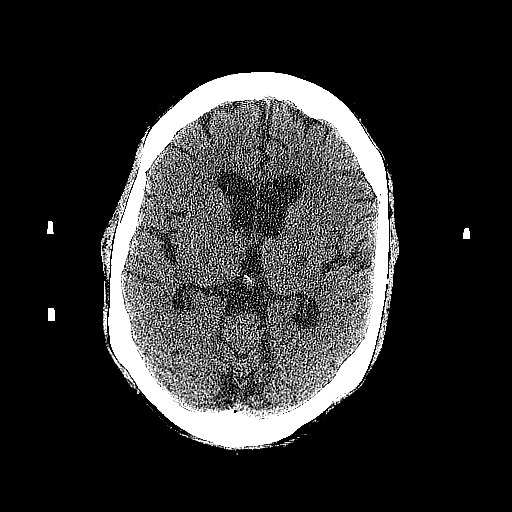
[im 24/56  brain]
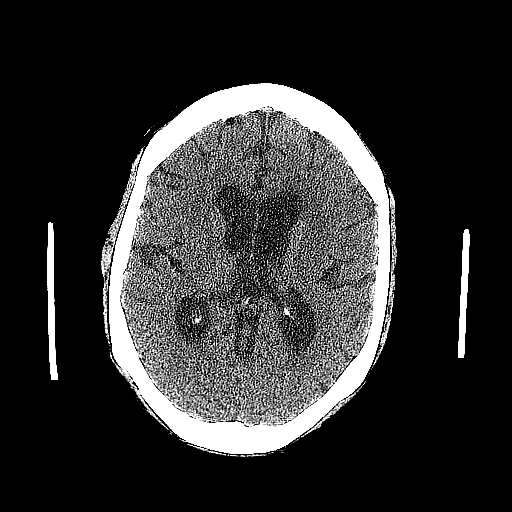
[im 29/56  brain]
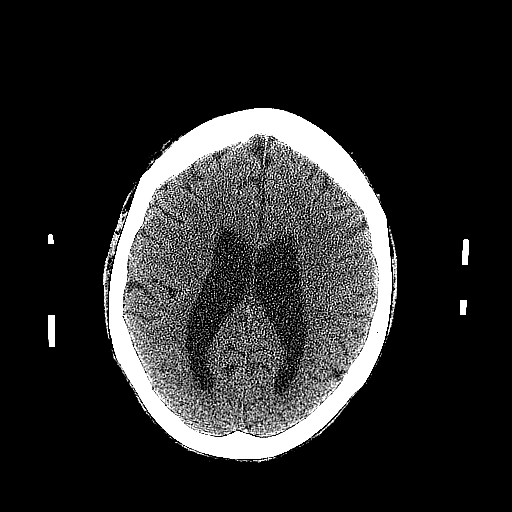
[im 29/56  bone]
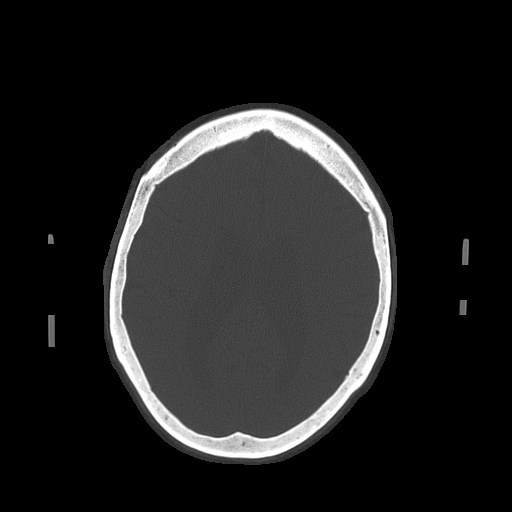
[im 32/56  brain]
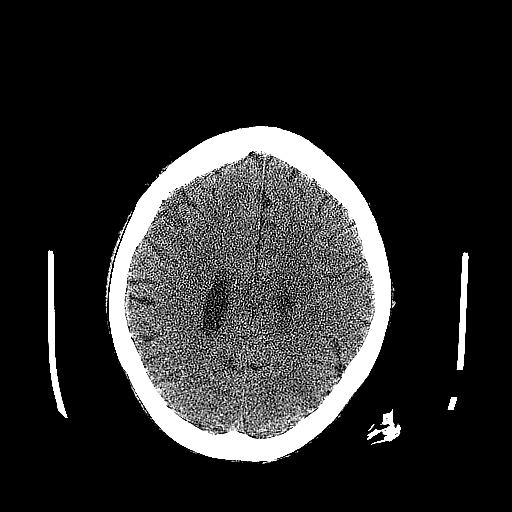
[im 35/56  brain]
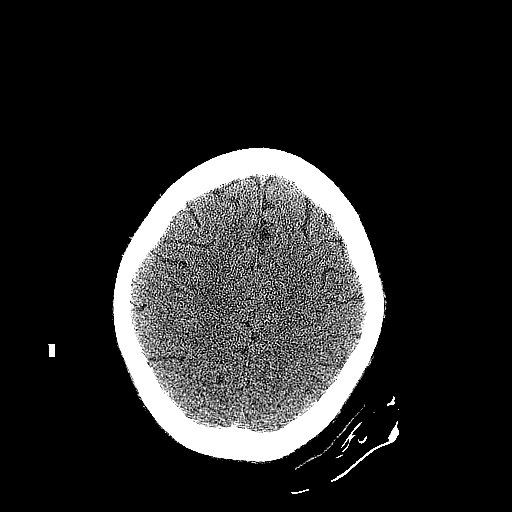
[im 38/56  brain]
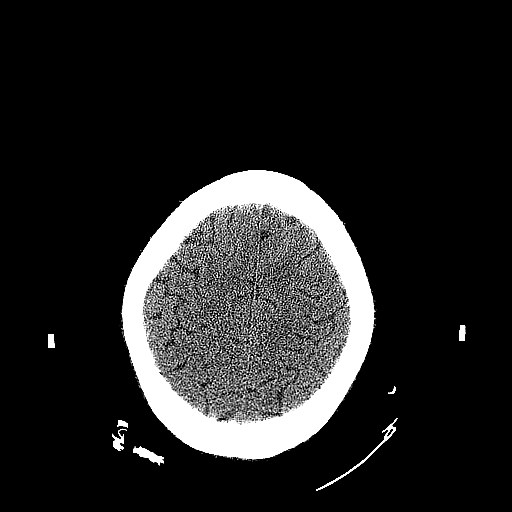
[im 41/56  brain]
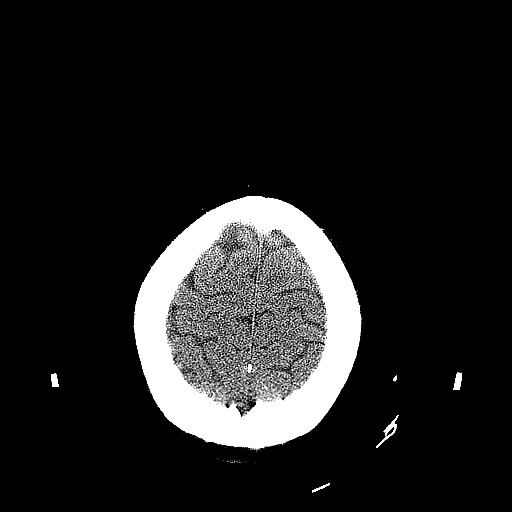
[im 41/56  bone]
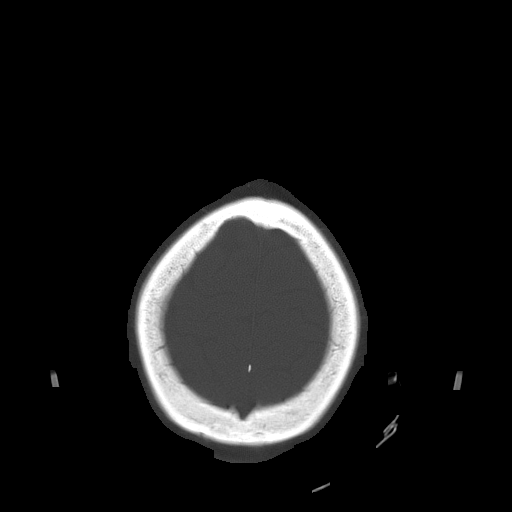
[im 44/56  brain]
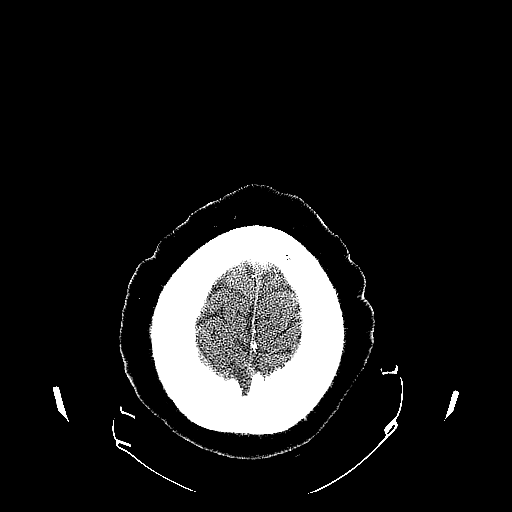
[im 47/56  brain]
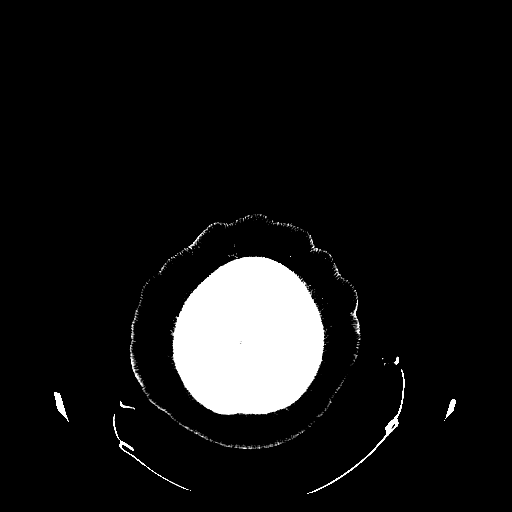
[im 53/56  brain]
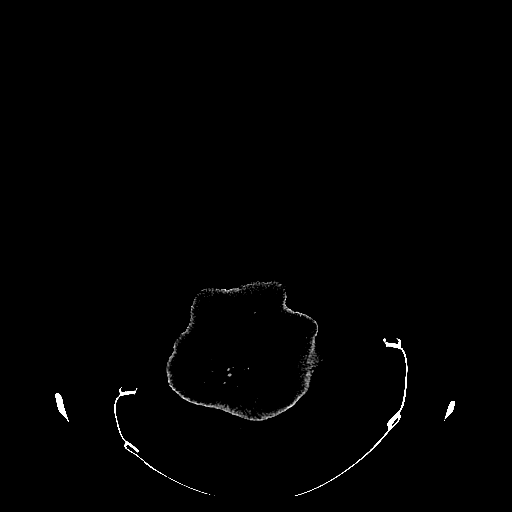

[16 of 30 positions shown; findings below may reference images not displayed]

FINDINGS: Slight ventricular prominence, unchanged from the  prior
study.  Negative for intracranial hemorrhage or mass lesion.  No
acute infarct.  No edema or mass effect.  Negative for skull
fracture. Osteoma of the frontal sinus is a chronic benign finding
unchanged from prior studies.
IMPRESSION: No acute intracranial abnormality.

## 2011-07-29 ENCOUNTER — Emergency Department (HOSPITAL_COMMUNITY): Payer: Medicare Other

## 2011-07-29 ENCOUNTER — Encounter (HOSPITAL_COMMUNITY): Payer: Self-pay | Admitting: Emergency Medicine

## 2011-07-29 ENCOUNTER — Inpatient Hospital Stay (HOSPITAL_COMMUNITY)
Admission: EM | Admit: 2011-07-29 | Discharge: 2011-07-30 | DRG: 194 | Disposition: A | Discharge status: Home / Self Care | Payer: Medicare Other | Attending: Family Medicine | Admitting: Family Medicine

## 2011-07-29 ENCOUNTER — Other Ambulatory Visit: Payer: Self-pay

## 2011-07-29 DIAGNOSIS — Z6841 Body Mass Index (BMI) 40.0 and over, adult: Secondary | ICD-10-CM

## 2011-07-29 DIAGNOSIS — F259 Schizoaffective disorder, unspecified: Secondary | ICD-10-CM | POA: Diagnosis present

## 2011-07-29 DIAGNOSIS — J189 Pneumonia, unspecified organism: Principal | ICD-10-CM

## 2011-07-29 DIAGNOSIS — I10 Essential (primary) hypertension: Secondary | ICD-10-CM | POA: Diagnosis present

## 2011-07-29 DIAGNOSIS — F319 Bipolar disorder, unspecified: Secondary | ICD-10-CM | POA: Diagnosis present

## 2011-07-29 DIAGNOSIS — I4891 Unspecified atrial fibrillation: Secondary | ICD-10-CM | POA: Diagnosis present

## 2011-07-29 DIAGNOSIS — F29 Unspecified psychosis not due to a substance or known physiological condition: Secondary | ICD-10-CM | POA: Diagnosis not present

## 2011-07-29 DIAGNOSIS — J45909 Unspecified asthma, uncomplicated: Secondary | ICD-10-CM

## 2011-07-29 DIAGNOSIS — F258 Other schizoaffective disorders: Secondary | ICD-10-CM

## 2011-07-29 DIAGNOSIS — E119 Type 2 diabetes mellitus without complications: Secondary | ICD-10-CM | POA: Diagnosis present

## 2011-07-29 DIAGNOSIS — R079 Chest pain, unspecified: Secondary | ICD-10-CM | POA: Diagnosis present

## 2011-07-29 DIAGNOSIS — Z79899 Other long term (current) drug therapy: Secondary | ICD-10-CM

## 2011-07-29 DIAGNOSIS — K219 Gastro-esophageal reflux disease without esophagitis: Secondary | ICD-10-CM | POA: Diagnosis present

## 2011-07-29 DIAGNOSIS — R062 Wheezing: Secondary | ICD-10-CM

## 2011-07-29 DIAGNOSIS — I252 Old myocardial infarction: Secondary | ICD-10-CM

## 2011-07-29 DIAGNOSIS — E669 Obesity, unspecified: Secondary | ICD-10-CM | POA: Diagnosis present

## 2011-07-29 DIAGNOSIS — J449 Chronic obstructive pulmonary disease, unspecified: Secondary | ICD-10-CM | POA: Diagnosis present

## 2011-07-29 DIAGNOSIS — J4489 Other specified chronic obstructive pulmonary disease: Secondary | ICD-10-CM | POA: Diagnosis present

## 2011-07-29 DIAGNOSIS — E039 Hypothyroidism, unspecified: Secondary | ICD-10-CM | POA: Diagnosis present

## 2011-07-29 DIAGNOSIS — R0902 Hypoxemia: Secondary | ICD-10-CM | POA: Diagnosis present

## 2011-07-29 DIAGNOSIS — Z7982 Long term (current) use of aspirin: Secondary | ICD-10-CM

## 2011-07-29 DIAGNOSIS — R45851 Suicidal ideations: Secondary | ICD-10-CM

## 2011-07-29 DIAGNOSIS — J159 Unspecified bacterial pneumonia: Secondary | ICD-10-CM

## 2011-07-29 LAB — DIFFERENTIAL
Basophils Absolute: 0 10*3/uL (ref 0.0–0.1)
Basophils Relative: 0 % (ref 0–1)
Neutro Abs: 5.7 10*3/uL (ref 1.7–7.7)
Neutrophils Relative %: 56 % (ref 43–77)

## 2011-07-29 LAB — RAPID HIV SCREEN (WH-MAU): Rapid HIV Screen: NONREACTIVE

## 2011-07-29 LAB — URINALYSIS, ROUTINE W REFLEX MICROSCOPIC
Glucose, UA: NEGATIVE mg/dL
Hgb urine dipstick: NEGATIVE
Protein, ur: NEGATIVE mg/dL
pH: 7 (ref 5.0–8.0)

## 2011-07-29 LAB — RAPID URINE DRUG SCREEN, HOSP PERFORMED
Amphetamines: NOT DETECTED
Barbiturates: NOT DETECTED
Tetrahydrocannabinol: NOT DETECTED

## 2011-07-29 LAB — ACETAMINOPHEN LEVEL: Acetaminophen (Tylenol), Serum: <15 ug/mL (ref 10–30)

## 2011-07-29 LAB — COMPREHENSIVE METABOLIC PANEL
AST: 13 U/L (ref 0–37)
Albumin: 2.4 g/dL — ABNORMAL LOW (ref 3.5–5.2)
Chloride: 95 mEq/L — ABNORMAL LOW (ref 96–112)
Creatinine, Ser: 0.75 mg/dL (ref 0.50–1.10)
Potassium: 4.2 mEq/L (ref 3.5–5.1)
Total Bilirubin: 0.1 mg/dL — ABNORMAL LOW (ref 0.3–1.2)
Total Protein: 6.9 g/dL (ref 6.0–8.3)

## 2011-07-29 LAB — SALICYLATE LEVEL: Salicylate Lvl: <2 mg/dL — ABNORMAL LOW (ref 2.8–20.0)

## 2011-07-29 LAB — CARDIAC PANEL(CRET KIN+CKTOT+MB+TROPI): CK, MB: 1.8 ng/mL (ref 0.3–4.0)

## 2011-07-29 LAB — CBC
MCHC: 32.7 g/dL (ref 30.0–36.0)
RDW: 14.3 % (ref 11.5–15.5)

## 2011-07-29 LAB — GLUCOSE, CAPILLARY: Glucose-Capillary: 138 mg/dL — ABNORMAL HIGH (ref 70–99)

## 2011-07-29 MED ORDER — FOLIC ACID 1 MG PO TABS
1.0000 mg | ORAL_TABLET | Freq: Every day | ORAL | Status: DC
  Administered 2011-07-29: 1 mg via ORAL
  Filled 2011-07-29 (×2): qty 1

## 2011-07-29 MED ORDER — CHLORPROMAZINE HCL 25 MG PO TABS
25.0000 mg | ORAL_TABLET | Freq: Two times a day (BID) | ORAL | Status: DC
  Administered 2011-07-30: 25 mg via ORAL
  Filled 2011-07-29 (×3): qty 1

## 2011-07-29 MED ORDER — CEFTRIAXONE SODIUM 1 G IJ SOLR: 1.0000 g | INTRAMUSCULAR | Status: DC

## 2011-07-29 MED ORDER — SODIUM CHLORIDE 0.9 % IV SOLN
INTRAVENOUS | Status: DC
  Administered 2011-07-29: 10 mL/h via INTRAVENOUS

## 2011-07-29 MED ORDER — AZITHROMYCIN 500 MG PO TABS: 500.0000 mg | ORAL_TABLET | Freq: Once | ORAL | Status: DC

## 2011-07-29 MED ORDER — ALBUTEROL SULFATE (5 MG/ML) 0.5% IN NEBU
2.5000 mg | INHALATION_SOLUTION | RESPIRATORY_TRACT | Status: DC
  Administered 2011-07-29 – 2011-07-30 (×3): 2.5 mg via RESPIRATORY_TRACT
  Filled 2011-07-29 (×3): qty 0.5

## 2011-07-29 MED ORDER — AZITHROMYCIN 500 MG PO TABS
500.0000 mg | ORAL_TABLET | Freq: Every day | ORAL | Status: DC
  Filled 2011-07-29: qty 1

## 2011-07-29 MED ORDER — METOPROLOL TARTRATE 50 MG PO TABS
50.0000 mg | ORAL_TABLET | Freq: Two times a day (BID) | ORAL | Status: DC
  Administered 2011-07-29 – 2011-07-30 (×2): 50 mg via ORAL
  Filled 2011-07-29 (×3): qty 1

## 2011-07-29 MED ORDER — ASPIRIN EC 81 MG PO TBEC
81.0000 mg | DELAYED_RELEASE_TABLET | Freq: Every day | ORAL | Status: DC
  Administered 2011-07-30: 81 mg via ORAL
  Filled 2011-07-29 (×2): qty 1

## 2011-07-29 MED ORDER — LEVOTHYROXINE SODIUM 150 MCG PO TABS
150.0000 ug | ORAL_TABLET | Freq: Every day | ORAL | Status: DC
  Administered 2011-07-30: 150 ug via ORAL
  Filled 2011-07-29 (×2): qty 1

## 2011-07-29 MED ORDER — ONDANSETRON HCL 4 MG PO TABS: 4.0000 mg | ORAL_TABLET | Freq: Three times a day (TID) | ORAL | Status: DC | PRN

## 2011-07-29 MED ORDER — ACETAMINOPHEN 325 MG PO TABS: 650.0000 mg | ORAL_TABLET | Freq: Four times a day (QID) | ORAL | Status: DC | PRN

## 2011-07-29 MED ORDER — PALIPERIDONE ER 6 MG PO TB24
6.0000 mg | ORAL_TABLET | Freq: Two times a day (BID) | ORAL | Status: DC
  Administered 2011-07-29 – 2011-07-30 (×2): 6 mg via ORAL
  Filled 2011-07-29 (×4): qty 1

## 2011-07-29 MED ORDER — IPRATROPIUM BROMIDE 0.02 % IN SOLN
0.5000 mg | RESPIRATORY_TRACT | Status: DC
  Administered 2011-07-29 – 2011-07-30 (×3): 0.5 mg via RESPIRATORY_TRACT
  Filled 2011-07-29 (×3): qty 2.5

## 2011-07-29 MED ORDER — HEPARIN SODIUM (PORCINE) 5000 UNIT/ML IJ SOLN
5000.0000 [IU] | Freq: Three times a day (TID) | INTRAMUSCULAR | Status: DC
  Administered 2011-07-29 – 2011-07-30 (×2): 5000 [IU] via SUBCUTANEOUS
  Filled 2011-07-29 (×5): qty 1

## 2011-07-29 MED ORDER — ALBUTEROL SULFATE (5 MG/ML) 0.5% IN NEBU
5.0000 mg | INHALATION_SOLUTION | Freq: Once | RESPIRATORY_TRACT | Status: AC
  Administered 2011-07-29: 5 mg via RESPIRATORY_TRACT
  Filled 2011-07-29: qty 1

## 2011-07-29 MED ORDER — CHLORPROMAZINE HCL 25 MG PO TABS: 25.0000 mg | ORAL_TABLET | Freq: Four times a day (QID) | ORAL | Status: DC

## 2011-07-29 MED ORDER — PANTOPRAZOLE SODIUM 40 MG PO TBEC
40.0000 mg | DELAYED_RELEASE_TABLET | Freq: Every day | ORAL | Status: DC
  Administered 2011-07-29: 40 mg via ORAL
  Filled 2011-07-29: qty 1

## 2011-07-29 MED ORDER — CHLORPROMAZINE HCL 50 MG PO TABS
50.0000 mg | ORAL_TABLET | Freq: Every day | ORAL | Status: DC
  Administered 2011-07-29: 50 mg via ORAL
  Filled 2011-07-29 (×2): qty 1

## 2011-07-29 MED ORDER — PREDNISONE 50 MG PO TABS
50.0000 mg | ORAL_TABLET | Freq: Every day | ORAL | Status: DC
  Administered 2011-07-29: 50 mg via ORAL
  Filled 2011-07-29 (×2): qty 1

## 2011-07-29 MED ORDER — LEVOFLOXACIN 750 MG PO TABS
750.0000 mg | ORAL_TABLET | Freq: Every day | ORAL | Status: DC
  Administered 2011-07-29: 750 mg via ORAL
  Filled 2011-07-29: qty 1

## 2011-07-29 MED ORDER — FERROUS SULFATE 325 (65 FE) MG PO TABS
325.0000 mg | ORAL_TABLET | Freq: Two times a day (BID) | ORAL | Status: DC
  Administered 2011-07-29 – 2011-07-30 (×2): 325 mg via ORAL
  Filled 2011-07-29 (×4): qty 1

## 2011-07-29 MED ORDER — IPRATROPIUM BROMIDE 0.02 % IN SOLN
0.5000 mg | Freq: Once | RESPIRATORY_TRACT | Status: AC
  Administered 2011-07-29: 0.5 mg via RESPIRATORY_TRACT
  Filled 2011-07-29: qty 2.5

## 2011-07-29 MED ORDER — DIVALPROEX SODIUM ER 500 MG PO TB24
1000.0000 mg | ORAL_TABLET | Freq: Two times a day (BID) | ORAL | Status: DC
  Administered 2011-07-29 – 2011-07-30 (×2): 1000 mg via ORAL
  Filled 2011-07-29 (×3): qty 2

## 2011-07-29 MED ORDER — DEXTROSE 5 % IV SOLN
1.0000 g | Freq: Once | INTRAVENOUS | Status: AC
  Administered 2011-07-29: 1 g via INTRAVENOUS
  Filled 2011-07-29: qty 10

## 2011-07-29 NOTE — H&P (Signed)
BELEN PESCH is an 61 y.o. female.   Family Medicine Teaching Service History and Physical Service Pager: [phone]  Chief Complaint: cough, wheezing HPI: Pt is a 61 yo female with significant mental health history of asthma/COPD and schizoaffective DO who is presenting to the ED via EMS after she had difficulty breathing at home. She states she has been getting worse in the last 2 weeks. Cough productive of yellow sputum with wheezing and  fevers (to 100.1 at home). Also has some central chest pain that started with her cough. Has been taking breathing treatments at home that doesn't help much. States she was evaluated by cardiologist Dr. Sharyn Lull on Tuesday who started pt on levaquin. She states that she has been vomiting though, not keeping this down. Her friend (possibly husband?) [name], was smoking at home overnight and made her symptoms worse.   Behavioral Health- Dr. Marney Setting. Patient states she saw him Monday when he came to her home.  Recently discharged from [facility] one month ago for schizophrenic episode. Currently states she has people in her house, but states they are not hallucinations. She is taking her meds. States she felt sad earlier today and that she wanted to hurt herself when her friend "got on her nerves." She states she is not currently wanting to hurt herself. States she feels lonely. States all she does is "sit at home and smells smoke all the time" and cannot keep living like that. She states she does feel safe at home. Denies any hallucinations currently.  ED Course: Patient received breathing treatment and O2 in the ED and is feeling somewhat better. Reported desat with ambulation. She threatened SI in ED therefore sitter is present in holding room.   ROS: She has a good appetite. Denies dysuria, difficulty with bowel movements. She states she does have pain in legs secondary to ?blood clots and she has not had her legs elevated as much. She states she has some pain in her  chest which is new.     Past Medical History  Diagnosis Date  . Schizoaffective disorder   . Urinary tract infection   . Hypertension   . Atrial fibrillation   . GERD (gastroesophageal reflux disease)   . Hypothyroidism   . Myocardial infarction   . Diabetes mellitus   . Obesity   . Asthma   . COPD (chronic obstructive pulmonary disease)     Past Surgical History  Procedure Date  . Tubal ligation   . Abdominal hysterectomy     History reviewed. No pertinent family history. Social History: Does not smoke. Does use oral tobacco. Does not drink. Lives with her friend [name] (for last 16 years.)  Allergies:  Allergies  Allergen Reactions  . Sulfamethoxazole W/Trimethoprim Nausea And Vomiting  . Penicillins Rash    Medications Prior to Admission  Medication Dose Route Frequency Provider Last Rate Last Dose  . albuterol (PROVENTIL) (5 MG/ML) 0.5% nebulizer solution 5 mg  5 mg Nebulization Once Nelia Shi, MD   5 mg at 07/29/11 1641  . azithromycin (ZITHROMAX) tablet 500 mg  500 mg Oral Once Nelia Shi, MD      . cefTRIAXone (ROCEPHIN) 1 g in dextrose 5 % 50 mL IVPB  1 g Intravenous Once Nelia Shi, MD   1 g at 07/29/11 1651  . ipratropium (ATROVENT) nebulizer solution 0.5 mg  0.5 mg Nebulization Once Nelia Shi, MD   0.5 mg at 07/29/11 1641   Medications Prior to Admission  Medication Sig Dispense Refill  . amLODipine (NORVASC) 5 MG tablet Take 1 tablet (5 mg total) by mouth daily. For blood pressure  30 tablet  0  . aspirin EC 81 MG EC tablet Take 1 tablet (81 mg total) by mouth daily.  30 tablet  0  . chlorproMAZINE (THORAZINE) 25 MG tablet For clear thoughts. Take 1 tablet twice daily and two tabs at bedtime.  120 tablet  0  . divalproex (DEPAKOTE ER) 500 MG 24 hr tablet Take 2 tablets (1,000 mg total) by mouth 2 (two) times daily in the am and at bedtime.. For mood stability.  120 tablet  0  . ferrous sulfate 325 (65 FE) MG tablet Take 1 tablet  (325 mg total) by mouth 2 (two) times daily with a meal.  60 tablet  0  . folic acid (FOLVITE) 1 MG tablet Take 1 tablet (1 mg total) by mouth daily. Folic Acid (vitamin)replacement  30 tablet  0  . levothyroxine (SYNTHROID, LEVOTHROID) 150 MCG tablet Take 1 tablet (150 mcg total) by mouth every morning. Thyroid replacement hormone.  30 tablet  0  . metoprolol (LOPRESSOR) 50 MG tablet Take 1 tablet (50 mg total) by mouth 2 (two) times daily.  60 tablet  0  . omeprazole (PRILOSEC) 20 MG capsule Take 20 mg by mouth 2 (two) times daily.       . paliperidone ([name]) 6 MG 24 hr tablet Take 1 tablet (6 mg total) by mouth 2 (two) times daily in the am and at bedtime.. For Clear thoughts  60 tablet  0   PHYSICAL EXAM:  Filed Vitals:   07/29/11 1901  BP: 99/68  Pulse: 77  Temp: 98.2 F (36.8 C)  Resp: 19  O2 sat 100% on 2L [place]  Physical Examination: General appearance - acyanotic, in no respiratory distress, chronically ill appearing and obese.  Mental status - speech difficult to understand. Alert, oriented to situation. Gross motor and sensation intact.  Eyes - pupils equal and reactive, extraocular eye movements intact Mouth - dental hygiene poor, tongue normal and mildly dry Chest - wheezing noted expiratory throughout, prolonged exp phase, decreased air movement Heart - normal rate, regular rhythm, normal S1, S2, no murmurs, rubs, clicks or gallops Abdomen - soft, nontender, nondistended, no masses or organomegaly Neurological - motor and sensory grossly normal bilaterally Musculoskeletal - symmetric edema to knees bilaterally, quite obese. No erythema. SOme TTP diffusely in legs. No skin breakdown noted. Extremities - peripheral pulses normal, no cyanosis Psych--Patient has normal tone of speech, not pressured. At times references other doctors, a "cop" she may be married to, but is generally difficult to understand. Denies SI/HI currently.    Results for orders placed during the hospital  encounter of 07/29/11 (from the past 48 hour(s))  ETHANOL     Status: Normal   Collection Time   07/29/11  2:30 PM      Component Value Range Comment   Alcohol, Ethyl (B) <11  0 - 11 (mg/dL)   ACETAMINOPHEN LEVEL     Status: Normal   Collection Time   07/29/11  2:30 PM      Component Value Range Comment   Acetaminophen (Tylenol), Serum <15.0  10 - 30 (ug/mL)   SALICYLATE LEVEL     Status: Abnormal   Collection Time   07/29/11  2:30 PM      Component Value Range Comment   Salicylate Lvl <2.0 (*) 2.8 - 20.0 (mg/dL)   CBC  Status: Abnormal   Collection Time   07/29/11  2:30 PM      Component Value Range Comment   WBC 10.2  4.0 - 10.5 (K/uL)    RBC 3.36 (*) 3.87 - 5.11 (MIL/uL)    Hemoglobin 11.2 (*) 12.0 - 15.0 (g/dL)    HCT 16.1 (*) 09.6 - 46.0 (%)    MCV 102.1 (*) 78.0 - 100.0 (fL)    MCH 33.3  26.0 - 34.0 (pg)    MCHC 32.7  30.0 - 36.0 (g/dL)    RDW 04.5  40.9 - 81.1 (%)    Platelets 187  150 - 400 (K/uL)   DIFFERENTIAL     Status: Abnormal   Collection Time   07/29/11  2:30 PM      Component Value Range Comment   Neutrophils Relative 56  43 - 77 (%)    Neutro Abs 5.7  1.7 - 7.7 (K/uL)    Lymphocytes Relative 32  12 - 46 (%)    Lymphs Abs 3.2  0.7 - 4.0 (K/uL)    Monocytes Relative 12  3 - 12 (%)    Monocytes Absolute 1.2 (*) 0.1 - 1.0 (K/uL)    Eosinophils Relative 0  0 - 5 (%)    Eosinophils Absolute 0.0  0.0 - 0.7 (K/uL)    Basophils Relative 0  0 - 1 (%)    Basophils Absolute 0.0  0.0 - 0.1 (K/uL)   COMPREHENSIVE METABOLIC PANEL     Status: Abnormal   Collection Time   07/29/11  2:30 PM      Component Value Range Comment   Sodium 132 (*) 135 - 145 (mEq/L)    Potassium 4.2  3.5 - 5.1 (mEq/L)    Chloride 95 (*) 96 - 112 (mEq/L)    CO2 33 (*) 19 - 32 (mEq/L)    Glucose, Bld 95  70 - 99 (mg/dL)    BUN 10  6 - 23 (mg/dL)    Creatinine, Ser 9.14  0.50 - 1.10 (mg/dL)    Calcium 9.6  8.4 - 10.5 (mg/dL)    Total Protein 6.9  6.0 - 8.3 (g/dL)    Albumin 2.4 (*) 3.5 - 5.2  (g/dL)    AST 13  0 - 37 (U/L)    ALT 21  0 - 35 (U/L)    Alkaline Phosphatase 68  39 - 117 (U/L)    Total Bilirubin 0.1 (*) 0.3 - 1.2 (mg/dL)    GFR calc non Af Amer >90  >90 (mL/min)    GFR calc Af Amer >90  >90 (mL/min)    Dg Chest 2 View  07/29/2011  *RADIOLOGY REPORT*  Clinical Data: Shortness of breath, cough  CHEST - 2 VIEW  Comparison: Portable chest x-ray of 06/21/2011  Findings: On the lateral view there is opacity posteriorly at the lung base probably on the left suspicious for pneumonia or effusion.  The right lung appears clear.  Mild cardiomegaly is stable.  No bony abnormality is seen.  IMPRESSION: Opacity posteriorly on the lateral view suspicious for left lower lobe patchy pneumonia or left effusion.  Original Report Authenticated By: Juline Patch, M.D.    ROS  Blood pressure 110/67, pulse 80, temperature 98.3 F (36.8 C), temperature source Oral, resp. rate 22, SpO2 97.00%. Physical Exam   Assessment/Plan 61 yo with hx of schizoaffective DO, Bipolar and asthma who presents with pneumonia and some transient SI, now resolved.  1. PNA. Hypoxia with exertion and infiltrate on  plain film. ?exposure to Healthcare setting at [facility] recently. Unsure if this represents a true failure of levaquin as patient claims emesis prevented ingestion as she claims. Received dose of rocephin and azithromycin in ED. Currently on levaquin 750mg  daily. Check sputum culture, urine strep ag. Suspect chest pain related to pna and bronchospasm, but will check one set CEs to rule out ischemia with hx of MI.  2. Asthma. Significant bronchospasm. Will start duoneb q4hr, prednisone 50mg  daily. Does not appear to be on controller medication on home list which may be helpful after DC if pt is compliant.  3. Psych. Patient not acutely manic or suicidal/homicidal. Has sitter in place. Will reassess daily and once stable medically. IF SI returns will need to contact [facility] for inpatient transfer. WIll continue home  antipsychotics.  4. Hypothyroid. Home dose thyroxine.  5. FEN. Patient is hungry, will allow carb modified diet. KVO PIV.   6. PPX. Heparin sq. ASA daily. Protonix.  7. Dispo. Pending improvement on meds, improvement of O2 saturation. And no further suicidal ideation.   HAIRFORD, AMBER PGY-1 07/29/2011, 6:08 PM   I have seen and evaluated pt with Dr. Mikel Cella and collaborated in H&P above.   Lloyd Huger, MD [facility] Resident - PGY-2 07/29/2011 7:01 PM    FMTS Attending Admit Note  Patient seen and examined by me, discussed with resident team and I agree with the plan as stated in note above.  Briefly, a 61 yo F with significant psychiatric history and recent admission to behavioral health inpatient, who presents to ED today with complaint of increased shortness of breath and malaise.  She admits to having had thoughts of self-harm earlier in the day, but states clearly that she has no intention of harming herself at time of my exam (16:30pm).  She denies auditory or visual hallucinations.    On exam she is mildly ill appearing, with marked wheezes and a delayed expiratory phase and rhonchi throughout lung fields. She appears mildly dry mucus membranes. Mild ankle edema is noted on exam as well.   Assess/ Plan: 61 year old woman with clinical findings and radiographic evidence of LLL Pneumonia.  She is reported to have desaturated with mild exertion upon arrival in the ED; she has O2 sats of 98% on 2L/min oxygen nasal canula at the time of my exam, without notable increased work of breathing or tachypnea.  I would not classify her as having a health care associated pneumonia based purely upon her Behavioural Health admission.  Agree with choice of FQ for her antibiotic course.  WIl continue to follow her mood and assess for SI/HI as her medical condition (PNA) improves.  Paula Compton, MD

## 2011-07-29 NOTE — Progress Notes (Signed)
Pharmacy Consult for Antibiotic dosing  [name] is a 61 yo lady on Levaquin for PNA.  Her CrCL is ~90 ml/min.  Dose of 750 mg is appropriate for her renal function.  Pharmacy will sign off.  Please advise if we can be of further assistance.

## 2011-07-29 NOTE — ED Notes (Signed)
The Act Team Reyne Dumas, is [phone]

## 2011-07-29 NOTE — ED Notes (Signed)
Pt here with outpatient ACT team members with SI with plan to use knife to harm self; pt sts she has been sick with PNA for several weeks and is not feeling better

## 2011-07-29 NOTE — ED Notes (Signed)
Per Admitting transport patient to [phone] and they will meet patient and place orders on floor. Admitting request cardiac monitoring, [place], RN  Advised patient is to be placed on monitor.

## 2011-07-29 NOTE — ED Notes (Signed)
Admitting at bedside 

## 2011-07-29 NOTE — ED Provider Notes (Signed)
I performed medical screening exam. Pt with recent diagnosis of pneumonia and started on Levaquin has persistent cough. Also, she admits to thoughts of SI saying she would harm herself with a knife. Lungs with rhonchi throughout, abd soft, no focal neuro deficits. Move back to room when available for medical clearance prior to psych eval.   Loren Racer, MD 07/29/11 1406

## 2011-07-29 NOTE — ED Notes (Signed)
Patient states she has PNA and she started wheezing and started having chest pain and states she was passing blood when she urinated x 2 weeks. Patient placed on monitor and 2L oxygen with sats of 98% . Friend at bedside. Patient states she has not seen any blood when she has urinated today. Patient states she wanted to hurt herself because she and [name] (boyfriend) and she was tired of him telling her what to do and she feel she has no friends and no one cares about her. Patient states she no longer wants to hurt herself. Patient states that she knows now that she has a lot to live for and she was feeling sorry for herself earlier today.

## 2011-07-29 NOTE — ED Notes (Signed)
Patient resting ant talking with staff. Patient remains on monitor and 2L oxygen with sats of 98%. Sitter at bedside.

## 2011-07-29 NOTE — ED Notes (Signed)
Dinner requested- Regular diet- No sharps 

## 2011-07-29 NOTE — ED Notes (Signed)
Patient transported to X-ray 

## 2011-07-29 NOTE — ED Provider Notes (Signed)
History     CSN: [id number]  Arrival date & time 07/29/11  1248   First MD Initiated Contact with Patient 07/29/11 1524      Chief Complaint  Patient presents with  . Medical Clearance  . Suicidal     HPI Pt here with outpatient ACT team members with SI with plan to use knife to harm self; pt sts she has been sick with PNA for several weeks and is not feeling better  Past Medical History  Diagnosis Date  . Schizoaffective disorder   . Urinary tract infection   . Hypertension   . Atrial fibrillation   . GERD (gastroesophageal reflux disease)   . Hypothyroidism   . Myocardial infarction   . Diabetes mellitus   . Obesity   . Asthma   . COPD (chronic obstructive pulmonary disease)     Past Surgical History  Procedure Date  . Tubal ligation   . Abdominal hysterectomy     History reviewed. No pertinent family history.  History  Substance Use Topics  . Smoking status: Former Games developer  . Smokeless tobacco: Current User    Types: Snuff, Chew  . Alcohol Use: 1.2 oz/week    2 Glasses of wine per week    OB History    Grav Para Term Preterm Abortions TAB SAB Ect Mult Living                  Review of Systems Negative except as noted in history of present illness Allergies  Sulfamethoxazole w/trimethoprim and Penicillins  Home Medications   Current Outpatient Rx  Name Route Sig Dispense Refill  . AMLODIPINE BESYLATE 5 MG PO TABS Oral Take 1 tablet (5 mg total) by mouth daily. For blood pressure 30 tablet 0  . ASPIRIN 81 MG PO TBEC Oral Take 1 tablet (81 mg total) by mouth daily. 30 tablet 0  . CHLORPROMAZINE HCL 25 MG PO TABS  For clear thoughts. Take 1 tablet twice daily and two tabs at bedtime. 120 tablet 0  . DIVALPROEX SODIUM ER 500 MG PO TB24 Oral Take 2 tablets (1,000 mg total) by mouth 2 (two) times daily in the am and at bedtime.. For mood stability. 120 tablet 0  . FERROUS SULFATE 325 (65 FE) MG PO TABS Oral Take 1 tablet (325 mg total) by mouth 2 (two)  times daily with a meal. 60 tablet 0  . FOLIC ACID 1 MG PO TABS Oral Take 1 tablet (1 mg total) by mouth daily. Folic Acid (vitamin)replacement 30 tablet 0  . HYDROCODONE-HOMATROPINE 5-1.5 MG/5ML PO SYRP Oral Take 5 mLs by mouth every 6 (six) hours as needed. cough    . LEVOFLOXACIN 500 MG PO TABS Oral Take 500 mg by mouth 2 (two) times daily. Patient states she was put on to take twice a day.    Marland Kitchen LEVOTHYROXINE SODIUM 150 MCG PO TABS Oral Take 1 tablet (150 mcg total) by mouth every morning. Thyroid replacement hormone. 30 tablet 0  . METOPROLOL TARTRATE 50 MG PO TABS Oral Take 1 tablet (50 mg total) by mouth 2 (two) times daily. 60 tablet 0  . OMEPRAZOLE 20 MG PO CPDR Oral Take 20 mg by mouth 2 (two) times daily.     Marland Kitchen PALIPERIDONE ER 6 MG PO TB24 Oral Take 1 tablet (6 mg total) by mouth 2 (two) times daily in the am and at bedtime.. For Clear thoughts 60 tablet 0    BP 134/82  Pulse 75  Temp(Src)  98.3 F (36.8 C) (Oral)  Resp 24  SpO2 94%  Physical Exam  Nursing note and vitals reviewed. Constitutional: She is oriented to person, place, and time. She appears well-developed and well-nourished. No distress.  HENT:  Head: Normocephalic and atraumatic.  Eyes: Pupils are equal, round, and reactive to light.  Neck: Normal range of motion.  Cardiovascular: Normal rate and intact distal pulses.          Date: 07/29/2011  Rate: 71  Rhythm: normal sinus rhythm  QRS Axis: normal  Intervals: normal  ST/T Wave abnormalities: nonspecific T wave changes  Conduction Disutrbances:none:   Old EKG Reviewed: unchanged     Pulmonary/Chest: Accessory muscle usage present. No respiratory distress. She has wheezes. She has rhonchi. She has no rales.  Abdominal: Normal appearance. She exhibits no distension.  Musculoskeletal: Normal range of motion.  Neurological: She is alert and oriented to person, place, and time. No cranial nerve deficit.  Skin: Skin is warm and dry. No rash noted.    Psychiatric: She has a normal mood and affect. Her behavior is normal.    ED Course  Procedures (including critical care time)  Labs Reviewed  SALICYLATE LEVEL - Abnormal; Notable for the following:    Salicylate Lvl <2.0 (*)    All other components within normal limits  CBC - Abnormal; Notable for the following:    RBC 3.36 (*)    Hemoglobin 11.2 (*)    HCT 34.3 (*)    MCV 102.1 (*)    All other components within normal limits  DIFFERENTIAL - Abnormal; Notable for the following:    Monocytes Absolute 1.2 (*)    All other components within normal limits  COMPREHENSIVE METABOLIC PANEL - Abnormal; Notable for the following:    Sodium 132 (*)    Chloride 95 (*)    CO2 33 (*)    Albumin 2.4 (*)    Total Bilirubin 0.1 (*)    All other components within normal limits  ETHANOL  ACETAMINOPHEN LEVEL  URINE RAPID DRUG SCREEN (HOSP PERFORMED)  URINALYSIS, ROUTINE W REFLEX MICROSCOPIC   Dg Chest 2 View  07/29/2011  *RADIOLOGY REPORT*  Clinical Data: Shortness of breath, cough  CHEST - 2 VIEW  Comparison: Portable chest x-ray of 06/21/2011  Findings: On the lateral view there is opacity posteriorly at the lung base probably on the left suspicious for pneumonia or effusion.  The right lung appears clear.  Mild cardiomegaly is stable.  No bony abnormality is seen.  IMPRESSION: Opacity posteriorly on the lateral view suspicious for left lower lobe patchy pneumonia or left effusion.  Original Report Authenticated By: Juline Patch, M.D.     1. Wheezing   2. Community acquired pneumonia   3. Suicidal ideations   4. ASTHMA       MDM   Patient given antibiotics in the emergency department.  Family medicine consulted for admission.       Nelia Shi, MD 07/29/11 [phone]

## 2011-07-29 NOTE — ED Notes (Signed)
Called report to [place], Charity fundraiser on [phone]. Admitting request for reg Bed. [place], Rn ask we hold patient until admitting places temp orders.

## 2011-07-29 NOTE — ED Notes (Signed)
Patient remains on monitor and 2L oxygen with NAD at this time. Sitter at bedside.

## 2011-07-30 DIAGNOSIS — E119 Type 2 diabetes mellitus without complications: Secondary | ICD-10-CM | POA: Insufficient documentation

## 2011-07-30 DIAGNOSIS — I2510 Atherosclerotic heart disease of native coronary artery without angina pectoris: Secondary | ICD-10-CM | POA: Insufficient documentation

## 2011-07-30 DIAGNOSIS — J189 Pneumonia, unspecified organism: Principal | ICD-10-CM | POA: Insufficient documentation

## 2011-07-30 DIAGNOSIS — J449 Chronic obstructive pulmonary disease, unspecified: Secondary | ICD-10-CM | POA: Insufficient documentation

## 2011-07-30 DIAGNOSIS — I10 Essential (primary) hypertension: Secondary | ICD-10-CM | POA: Insufficient documentation

## 2011-07-30 DIAGNOSIS — R4182 Altered mental status, unspecified: Secondary | ICD-10-CM | POA: Insufficient documentation

## 2011-07-30 DIAGNOSIS — E669 Obesity, unspecified: Secondary | ICD-10-CM | POA: Insufficient documentation

## 2011-07-30 DIAGNOSIS — J4489 Other specified chronic obstructive pulmonary disease: Secondary | ICD-10-CM | POA: Insufficient documentation

## 2011-07-30 DIAGNOSIS — F259 Schizoaffective disorder, unspecified: Secondary | ICD-10-CM | POA: Insufficient documentation

## 2011-07-30 DIAGNOSIS — R112 Nausea with vomiting, unspecified: Secondary | ICD-10-CM | POA: Insufficient documentation

## 2011-07-30 LAB — COMPREHENSIVE METABOLIC PANEL
ALT: 18 U/L (ref 0–35)
AST: 12 U/L (ref 0–37)
Albumin: 2.5 g/dL — ABNORMAL LOW (ref 3.5–5.2)
Alkaline Phosphatase: 74 U/L (ref 39–117)
Calcium: 9.9 mg/dL (ref 8.4–10.5)
Potassium: 4.5 mEq/L (ref 3.5–5.1)
Sodium: 131 mEq/L — ABNORMAL LOW (ref 135–145)
Total Protein: 7.9 g/dL (ref 6.0–8.3)

## 2011-07-30 LAB — CBC
HCT: 38.4 % (ref 36.0–46.0)
Hemoglobin: 12.1 g/dL (ref 12.0–15.0)
MCHC: 31.5 g/dL (ref 30.0–36.0)
MCV: 101.3 fL — ABNORMAL HIGH (ref 78.0–100.0)
RDW: 14.2 % (ref 11.5–15.5)

## 2011-07-30 LAB — STREP PNEUMONIAE URINARY ANTIGEN: Strep Pneumo Urinary Antigen: NEGATIVE

## 2011-07-30 MED ORDER — LEVOFLOXACIN 750 MG PO TABS: 750.0000 mg | ORAL_TABLET | ORAL | Status: DC

## 2011-07-30 MED ORDER — LEVOFLOXACIN 750 MG PO TABS
750.0000 mg | ORAL_TABLET | ORAL | Status: DC
  Filled 2011-07-30: qty 1

## 2011-07-30 MED ORDER — ALBUTEROL 90 MCG/ACT IN AERS: 2.0000 | INHALATION_SPRAY | RESPIRATORY_TRACT | Status: DC | PRN

## 2011-07-30 MED ORDER — PREDNISONE 50 MG PO TABS: 50.0000 mg | ORAL_TABLET | Freq: Every day | ORAL | Status: DC

## 2011-07-30 NOTE — Progress Notes (Signed)
FMTS Attending Note  Patient seen and examined today by me; see my separate PN entry.  Plan for discharge to home today with oral antibiotics and outpatient follow up on Monday, Feb 11th. She is clear in denying any SI or HI. Paula Compton, MD

## 2011-07-30 NOTE — Progress Notes (Signed)
Gave pt new prescriptions, AVS instructions, follow up appointments and med administration times. D/C IV, unremakable. D/C telemetry. All questions answered. Pt left for home with husband. Volunteers wheeled her to short stay entrance.   Peter Congo RN

## 2011-07-30 NOTE — Progress Notes (Signed)
Patient at the beginning of shift keeps complaining that her husband and family are plotting to kill her that that she was wanting to kill her self before they could get to her. Patient is not keen to situation and her perception to of reality seems to be off. 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 Patient asked to use phone, sitter from 7 pm to 11 pm let patient use phone. Patient called 911 to try and file a report on her husband and to say that she need to be admitted into the hospital. 911 called the operator and operator contacted her unit to make staff aware. Patient through out the night continued to state that she was going in between stating she was non suicidal and suicidal. Patient then went to the bathroom and refused to leave bathroom, until suicide precaution were lifted patient then progresses to continue to pee on floor to punish the staff for keeping her on suicidal precaution, and refused to wash herself so that staff could smell her. This AM patient comes to the front desk against the advice of the sitter and ask for a phone with a cord to be placed in her room. Staff states while on suicide precautions she would not be allowed to have her own phone. Patient then begins to make extremely loud moaning and groaning at the front desk while eyes are rolled back. This last for about 5 minutes. Patient refuses to listen to staff and began chanting I need my doctor, CIRT was called on patient, due to her refusing to go back to room. MD was called. Patient was escorted back to her room. Will continue to monitor. Madilyn Fireman [place]all

## 2011-07-30 NOTE — Progress Notes (Signed)
FMTS Attending Note  Patient seen and examined by me this morning, reports feeling much better and 'ready to go home'.  She is breathing comfortably off nasal cannula supplemental oxygen.  Tolerating oral antibiotic without side effects.   Patient expresses frustration at being kept under suicide precautions, being deprived of telephone privileges, and not being allowed to speak with her husband.  She is adamant that she has no thoughts or inclinations to harm herself or anyone else, "I just want to be able to talk with my husband and take a bath without a sitter watching me-- it violates my privacy".  Assess/Plan: Patient admitted with LLL Pneumonia, to be considered CAP (versus HCAP); appears medically stable for discharge.  After reviewing nursing notes from night shift, I spoke with patient's nurse this morning, who states that the patient has been agreeable and has repeatedly denied any thoughts of harming herself.  I believe it would be unlikely that the patient would require (or qualify for) inpatient Behavioral Health stay at this time based on my interview with her today, and yesterday in the ED.  I believe it is appropriate to d/c the sitter precautions today and to discharge her to her home with oral antibiotics (Levaquin).  She states she has a medical appointment on Monday, Feb 11th and a mental health outpatient appointment in March.   Paula Compton, MD

## 2011-07-30 NOTE — Discharge Summary (Signed)
Physician Discharge Summary  Patient ID: [name] MRN: [id number] DOB/AGE: 02-18-1951 61 y.o.  Admit date: 07/29/2011 Discharge date: 07/30/2011  Admission Diagnoses: Community acquired pneumonia, asthma exacerbation, suicidal ideations   Discharge Diagnoses:  Active Problems:  ASTHMA  CHEST PAIN  Community acquired pneumonia  Suicidal ideations Schizoaffective disorder, Bipolar Hypothryoidism Psychosis, resolved Hypertension  Discharged Condition: good  Hospital Course: Pt is a 61 yo female with significant mental health history of asthma/COPD and schizoaffective DO who is presenting to the ED via EMS with 2 weeks worsening productive cough, wheezing, subjective fevers. CXR revealing possible infiltrate.  Patient had significant bronchospasm and treated with duonebs with improvement of symtoms. Patient was admitted due to a slight desaturation with ambulation. O2 was weaned quickly after bronchodilators. She also had period of frustration with her living situation, endorsing SI in the ED holding area. A sitter was put in place, and patient went on to deny any plans or thoughts of self harm or HI throughout her course. Overnight had some agitation, paranoia and acting out, but was restarted on home antipsychotics and his am she is appropriate, stating she feels well after treatment and desires to go home with her husband [name]. She does feel safe at home, denies any SI/HI. Has appointment at [facility] to f/u next week.   Consults: None  Significant Diagnostic Studies: Urine strep ag negative. CXR: Opacity posteriorly on the lateral view suspicious for left lower  lobe patchy pneumonia or left effusion.  Treatments: antibiotics: Levaquin, ceftriaxone x1 and azithro x1, steroids: prednisone and respiratory therapy: duonebs  Discharge Exam: Blood pressure 129/86, pulse 92, temperature 98.1 F (36.7 C), temperature source Oral, resp. rate 20, height 5\' 2"  (1.575 m), weight  309 lb 8.4 oz (140.4 kg), SpO2 91.00%. General appearance: alert  Head: Normocephalic, without obvious abnormality, atraumatic  Resp: clear to auscultation bilaterally  Cardio: regular rate and rhythm, S1, S2 normal, no murmur, click, rub or gallop  GI: soft, non-tender; bowel sounds normal; no masses, no organomegaly  Extremities: extremities normal, atraumatic, no cyanosis or edema  Neurologic: Alert and oriented X 3, normal strength and tone. Normal symmetric reflexes. Normal coordination and gait  Mental status: alertness: alert, orientation: time, place, city, affect: normal, thought content exhibits logical connections, when questioned about suicide, the patient expresses no suicidal ideation  No homicidal ideation. Plans to take medications at DC.   Disposition: Home or Self Care   Medication List  As of 07/30/2011  9:30 AM   STOP taking these medications         paliperidone 6 MG 24 hr tablet         TAKE these medications         albuterol 90 MCG/ACT inhaler   Commonly known as: PROVENTIL,VENTOLIN   Inhale 2 puffs into the lungs every 4 (four) hours as needed for wheezing or shortness of breath.      amLODipine 5 MG tablet   Commonly known as: NORVASC   Take 1 tablet (5 mg total) by mouth daily. For blood pressure      aspirin 81 MG EC tablet   Take 1 tablet (81 mg total) by mouth daily.      chlorproMAZINE 25 MG tablet   Commonly known as: THORAZINE   For clear thoughts. Take 1 tablet twice daily and two tabs at bedtime.      divalproex 500 MG 24 hr tablet   Commonly known as: DEPAKOTE ER   Take 2 tablets (1,000 mg  total) by mouth 2 (two) times daily in the am and at bedtime.. For mood stability.      ferrous sulfate 325 (65 FE) MG tablet   Take 1 tablet (325 mg total) by mouth 2 (two) times daily with a meal.      folic acid 1 MG tablet   Commonly known as: FOLVITE   Take 1 tablet (1 mg total) by mouth daily. Folic Acid (vitamin)replacement       HYDROcodone-homatropine 5-1.5 MG/5ML syrup   Commonly known as: HYCODAN   Take 5 mLs by mouth every 6 (six) hours as needed. cough      levofloxacin 750 MG tablet   Commonly known as: LEVAQUIN   Take 1 tablet (750 mg total) by mouth daily.      levothyroxine 150 MCG tablet   Commonly known as: SYNTHROID, LEVOTHROID   Take 1 tablet (150 mcg total) by mouth every morning. Thyroid replacement hormone.      metoprolol 50 MG tablet   Commonly known as: LOPRESSOR   Take 1 tablet (50 mg total) by mouth 2 (two) times daily.      omeprazole 20 MG capsule   Commonly known as: PRILOSEC   Take 20 mg by mouth 2 (two) times daily.      predniSONE 50 MG tablet   Commonly known as: DELTASONE   Take 1 tablet (50 mg total) by mouth daily.           Follow-up Information    Follow up with [facility], MD .         SignedLloyd Huger PGY-2 07/30/2011, 9:05 AM

## 2011-07-30 NOTE — Progress Notes (Signed)
Subjective: Weaned of O2 supplementation. Overnight patient with paranoid behavior, called 911 and reported husband plotting against her, required security to be called and take her back to her room. Screaming and moaning. Currently she is subdued and appropriate since nursing shift change. Requesting phone to call husband clarence for transport home.  Objective: Vital signs in last 24 hours: Temp:  [97.7 F (36.5 C)-98.3 F (36.8 C)] 98.1 F (36.7 C) (02/09 0545) Pulse Rate:  [75-92] 92  (02/09 0545) Resp:  [16-24] 20  (02/09 0545) BP: (99-134)/(65-86) 129/86 mmHg (02/09 0545) SpO2:  [91 %-100 %] 91 % (02/09 0545) Weight:  [309 lb 8.4 oz (140.4 kg)] 309 lb 8.4 oz (140.4 kg) (02/08 1901) Weight change:  Last BM Date: 07/28/11  Intake/Output from previous day: 02/08 0701 - 02/09 0700 In: 240 [P.O.:240] Out: 950 [Urine:950] Intake/Output this shift:    General appearance: alert Head: Normocephalic, without obvious abnormality, atraumatic Resp: clear to auscultation bilaterally Cardio: regular rate and rhythm, S1, S2 normal, no murmur, click, rub or gallop GI: soft, non-tender; bowel sounds normal; no masses,  no organomegaly Extremities: extremities normal, atraumatic, no cyanosis or edema Neurologic: Alert and oriented X 3, normal strength and tone. Normal symmetric reflexes. Normal coordination and gait Mental status: alertness: alert, orientation: time, place, city, affect: normal, thought content exhibits logical connections, when questioned about suicide, the patient expresses no suicidal ideation No homicidal ideation. Plans to take medications at DC.  Lab Results:  Basename 07/30/11 0630 07/29/11 1430  WBC 7.3 10.2  HGB 12.1 11.2*  HCT 38.4 34.3*  PLT 223 187   BMET  Basename 07/30/11 0630 07/29/11 1430  NA 131* 132*  K 4.5 4.2  CL 95* 95*  CO2 27 33*  GLUCOSE 171* 95  BUN 10 10  CREATININE 0.66 0.75  CALCIUM 9.9 9.6   UDS Negative UA: negative Lab Results   Component Value Date   CKTOTAL 16 07/29/2011   CKMB 1.8 07/29/2011   TROPONINI <0.30 07/29/2011    Studies/Results: Dg Chest 2 View  07/29/2011  *RADIOLOGY REPORT*  Clinical Data: Shortness of breath, cough  CHEST - 2 VIEW  Comparison: Portable chest x-ray of 06/21/2011  Findings: On the lateral view there is opacity posteriorly at the lung base probably on the left suspicious for pneumonia or effusion.  The right lung appears clear.  Mild cardiomegaly is stable.  No bony abnormality is seen.  IMPRESSION: Opacity posteriorly on the lateral view suspicious for left lower lobe patchy pneumonia or left effusion.  Original Report Authenticated By: Juline Patch, M.D.    Medications:  Scheduled:   . ipratropium  0.5 mg Nebulization Q4H   And  . albuterol  2.5 mg Nebulization Q4H  . albuterol  5 mg Nebulization Once  . aspirin EC  81 mg Oral Daily  . azithromycin  500 mg Oral Once  . azithromycin  500 mg Oral Daily  . cefTRIAXone (ROCEPHIN)  IV  1 g Intravenous Once  . cefTRIAXone (ROCEPHIN)  IV  1 g Intravenous Q24H  . chlorproMAZINE  25 mg Oral BID  . chlorproMAZINE  50 mg Oral QHS  . divalproex  1,000 mg Oral BID  . ferrous sulfate  325 mg Oral BID WC  . folic acid  1 mg Oral Daily  . heparin  5,000 Units Subcutaneous Q8H  . ipratropium  0.5 mg Nebulization Once  . levothyroxine  150 mcg Oral Q0600  . metoprolol  50 mg Oral BID  . paliperidone  6 mg Oral BID  . pantoprazole  40 mg Oral Q1200  . predniSONE  50 mg Oral Q2000  . DISCONTD: chlorproMAZINE  25 mg Oral QID  . DISCONTD: levofloxacin  750 mg Oral Daily   MVH:[id number], ondansetron  Assessment/Plan: 61 yo with hx of schizoaffective DO, Bipolar and asthma who presents with pneumonia and some transient SI, now with psychosis.   1. PNA. Patient weaned to room air overnight. Received dose of rocephin and azithromycin in ED which has been changed to oral levaquin 750mg  daily. Sputum culture pending, neg urine strep ag.  Suspect chest pain related to bronchospasm with negative CEs and EKG, now improved.  2. Asthma. Significant bronchospasm. Continue duoneb q4hr, prednisone 50mg  daily. Does not appear to be on controller medication on home list which may be helpful after DC if pt is compliant.   3. Psych. Patient developed some psychosis/mania overnight which has now resolved, though continues to take her oral medications. Has sitter in place. Will DC suicide precautions and plan for DC to home as pt is stable on home antipsychotics for now.   4. Hypothyroid. Home dose thyroxine.   5. FEN. Patient is hungry, will allow carb modified diet. DC PIV.   6. PPX. Heparin sq. ASA daily. Protonix.   7. Dispo. Off O2 currently, stable from a medical perspective or oral treatment for asthma/PNA as outpatient. No acute psych needs today.      LOS: 1 day   Taiz Bickle PGY-2 07/30/2011, 7:27 AM

## 2011-07-30 NOTE — ED Notes (Signed)
Per EMS: pt was seen and discharged from our facility for pneumonia. Pt state that earlier this evening she was vomiting blood. Pt was not actively vomiting with EMS, nor during triage. Pt states she has been having diarrhea and some wheezing. Pt from home lives with husband.

## 2011-07-31 ENCOUNTER — Emergency Department (HOSPITAL_COMMUNITY): Payer: Medicare Other

## 2011-07-31 ENCOUNTER — Encounter (HOSPITAL_COMMUNITY): Payer: Self-pay | Admitting: *Deleted

## 2011-07-31 ENCOUNTER — Observation Stay (HOSPITAL_COMMUNITY)
Admission: EM | Admit: 2011-07-31 | Discharge: 2011-08-01 | Disposition: A | Discharge status: Home / Self Care | Payer: Medicare Other | Attending: Family Medicine | Admitting: Family Medicine

## 2011-07-31 ENCOUNTER — Other Ambulatory Visit: Payer: Self-pay

## 2011-07-31 DIAGNOSIS — J189 Pneumonia, unspecified organism: Secondary | ICD-10-CM

## 2011-07-31 LAB — URINALYSIS, ROUTINE W REFLEX MICROSCOPIC
Bilirubin Urine: NEGATIVE
Glucose, UA: NEGATIVE mg/dL
Hgb urine dipstick: NEGATIVE
Specific Gravity, Urine: 1.007 (ref 1.005–1.030)

## 2011-07-31 LAB — COMPREHENSIVE METABOLIC PANEL
ALT: 16 U/L (ref 0–35)
AST: 19 U/L (ref 0–37)
Albumin: 2.6 g/dL — ABNORMAL LOW (ref 3.5–5.2)
CO2: 30 mEq/L (ref 19–32)
Calcium: 10 mg/dL (ref 8.4–10.5)
Chloride: 94 mEq/L — ABNORMAL LOW (ref 96–112)
Creatinine, Ser: 0.76 mg/dL (ref 0.50–1.10)
Sodium: 131 mEq/L — ABNORMAL LOW (ref 135–145)
Total Bilirubin: 0.2 mg/dL — ABNORMAL LOW (ref 0.3–1.2)

## 2011-07-31 LAB — GLUCOSE, CAPILLARY
Glucose-Capillary: 87 mg/dL (ref 70–99)
Glucose-Capillary: 151 mg/dL — ABNORMAL HIGH (ref 70–99)
Glucose-Capillary: 142 mg/dL — ABNORMAL HIGH (ref 70–99)

## 2011-07-31 LAB — DIFFERENTIAL
Basophils Absolute: 0 10*3/uL (ref 0.0–0.1)
Basophils Relative: 0 % (ref 0–1)
Lymphocytes Relative: 28 % (ref 12–46)
Monocytes Absolute: 1.6 10*3/uL — ABNORMAL HIGH (ref 0.1–1.0)
Neutro Abs: 6.5 10*3/uL (ref 1.7–7.7)

## 2011-07-31 LAB — CBC
HCT: 35.8 % — ABNORMAL LOW (ref 36.0–46.0)
MCHC: 33.2 g/dL (ref 30.0–36.0)
Platelets: 234 10*3/uL (ref 150–400)
RDW: 14.2 % (ref 11.5–15.5)
WBC: 11.2 10*3/uL — ABNORMAL HIGH (ref 4.0–10.5)

## 2011-07-31 MED ORDER — CHLORPROMAZINE HCL 25 MG PO TABS
25.0000 mg | ORAL_TABLET | Freq: Two times a day (BID) | ORAL | Status: DC
  Administered 2011-07-31 – 2011-08-01 (×3): 25 mg via ORAL
  Filled 2011-07-31 (×6): qty 1

## 2011-07-31 MED ORDER — IPRATROPIUM BROMIDE 0.02 % IN SOLN
0.5000 mg | Freq: Four times a day (QID) | RESPIRATORY_TRACT | Status: DC
  Administered 2011-07-31 – 2011-08-01 (×4): 0.5 mg via RESPIRATORY_TRACT
  Filled 2011-07-31 (×5): qty 2.5

## 2011-07-31 MED ORDER — DIVALPROEX SODIUM ER 500 MG PO TB24
1000.0000 mg | ORAL_TABLET | Freq: Two times a day (BID) | ORAL | Status: DC
  Administered 2011-07-31 – 2011-08-01 (×3): 1000 mg via ORAL
  Filled 2011-07-31 (×4): qty 2

## 2011-07-31 MED ORDER — SODIUM CHLORIDE 0.9 % IJ SOLN: 3.0000 mL | INTRAMUSCULAR | Status: DC | PRN

## 2011-07-31 MED ORDER — CHLORPROMAZINE HCL 25 MG PO TABS: 25.0000 mg | ORAL_TABLET | Freq: Four times a day (QID) | ORAL | Status: DC

## 2011-07-31 MED ORDER — PALIPERIDONE ER 6 MG PO TB24
6.0000 mg | ORAL_TABLET | Freq: Two times a day (BID) | ORAL | Status: DC
  Administered 2011-07-31 – 2011-08-01 (×3): 6 mg via ORAL
  Filled 2011-07-31 (×4): qty 1

## 2011-07-31 MED ORDER — ONDANSETRON HCL 4 MG PO TABS
4.0000 mg | ORAL_TABLET | Freq: Four times a day (QID) | ORAL | Status: DC | PRN
Start: 2011-07-31 — End: 2011-08-01
  Administered 2011-07-31: 4 mg via ORAL
  Filled 2011-07-31 (×2): qty 1

## 2011-07-31 MED ORDER — AMLODIPINE BESYLATE 5 MG PO TABS
5.0000 mg | ORAL_TABLET | Freq: Every day | ORAL | Status: DC
  Administered 2011-07-31 – 2011-08-01 (×2): 5 mg via ORAL
  Filled 2011-07-31 (×2): qty 1

## 2011-07-31 MED ORDER — ASPIRIN EC 81 MG PO TBEC
81.0000 mg | DELAYED_RELEASE_TABLET | Freq: Every day | ORAL | Status: DC
  Administered 2011-07-31 – 2011-08-01 (×2): 81 mg via ORAL
  Filled 2011-07-31 (×2): qty 1

## 2011-07-31 MED ORDER — ONDANSETRON HCL 4 MG/2ML IJ SOLN
4.0000 mg | Freq: Once | INTRAMUSCULAR | Status: AC
  Administered 2011-07-31: 4 mg via INTRAVENOUS
  Filled 2011-07-31: qty 2

## 2011-07-31 MED ORDER — LEVOFLOXACIN 750 MG PO TABS
750.0000 mg | ORAL_TABLET | Freq: Every day | ORAL | Status: DC
  Administered 2011-07-31 – 2011-08-01 (×2): 750 mg via ORAL
  Filled 2011-07-31 (×2): qty 1

## 2011-07-31 MED ORDER — SODIUM CHLORIDE 0.9 % IV SOLN: 250.0000 mL | INTRAVENOUS | Status: DC | PRN

## 2011-07-31 MED ORDER — CHLORPROMAZINE HCL 25 MG PO TABS
25.0000 mg | ORAL_TABLET | Freq: Two times a day (BID) | ORAL | Status: DC
  Filled 2011-07-31 (×2): qty 1

## 2011-07-31 MED ORDER — LEVOFLOXACIN 750 MG PO TABS: 750.0000 mg | ORAL_TABLET | Freq: Every day | ORAL | Status: DC

## 2011-07-31 MED ORDER — METOPROLOL TARTRATE 50 MG PO TABS
50.0000 mg | ORAL_TABLET | Freq: Two times a day (BID) | ORAL | Status: DC
  Administered 2011-07-31 – 2011-08-01 (×3): 50 mg via ORAL
  Filled 2011-07-31 (×4): qty 1

## 2011-07-31 MED ORDER — SODIUM CHLORIDE 0.9 % IJ SOLN
3.0000 mL | Freq: Two times a day (BID) | INTRAMUSCULAR | Status: DC
  Administered 2011-07-31: 3 mL via INTRAVENOUS

## 2011-07-31 MED ORDER — PREDNISONE 50 MG PO TABS
50.0000 mg | ORAL_TABLET | Freq: Every day | ORAL | Status: DC
  Administered 2011-07-31 – 2011-08-01 (×2): 50 mg via ORAL
  Filled 2011-07-31 (×3): qty 1

## 2011-07-31 MED ORDER — ALBUTEROL SULFATE (5 MG/ML) 0.5% IN NEBU
2.5000 mg | INHALATION_SOLUTION | Freq: Four times a day (QID) | RESPIRATORY_TRACT | Status: DC
  Administered 2011-07-31 – 2011-08-01 (×4): 2.5 mg via RESPIRATORY_TRACT
  Filled 2011-07-31 (×5): qty 0.5

## 2011-07-31 MED ORDER — PANTOPRAZOLE SODIUM 40 MG PO TBEC
40.0000 mg | DELAYED_RELEASE_TABLET | Freq: Every day | ORAL | Status: DC
  Administered 2011-07-31 – 2011-08-01 (×2): 40 mg via ORAL
  Filled 2011-07-31 (×2): qty 1

## 2011-07-31 MED ORDER — CHLORPROMAZINE HCL 50 MG PO TABS
50.0000 mg | ORAL_TABLET | Freq: Every day | ORAL | Status: DC
  Administered 2011-07-31: 50 mg via ORAL
  Filled 2011-07-31 (×3): qty 1

## 2011-07-31 MED ORDER — LEVOTHYROXINE SODIUM 150 MCG PO TABS
150.0000 ug | ORAL_TABLET | Freq: Every day | ORAL | Status: DC
  Administered 2011-07-31 – 2011-08-01 (×2): 150 ug via ORAL
  Filled 2011-07-31 (×3): qty 1

## 2011-07-31 MED ORDER — ENOXAPARIN SODIUM 40 MG/0.4ML ~~LOC~~ SOLN
40.0000 mg | Freq: Every day | SUBCUTANEOUS | Status: DC
  Administered 2011-07-31 – 2011-08-01 (×2): 40 mg via SUBCUTANEOUS
  Filled 2011-07-31 (×2): qty 0.4

## 2011-07-31 MED ORDER — SODIUM CHLORIDE 0.9 % IV BOLUS (SEPSIS)
500.0000 mL | Freq: Once | INTRAVENOUS | Status: AC
  Administered 2011-07-31: 500 mL via INTRAVENOUS

## 2011-07-31 MED ORDER — FOLIC ACID 1 MG PO TABS
1.0000 mg | ORAL_TABLET | Freq: Every day | ORAL | Status: DC
  Administered 2011-07-31 – 2011-08-01 (×2): 1 mg via ORAL
  Filled 2011-07-31 (×2): qty 1

## 2011-07-31 MED ORDER — INSULIN ASPART 100 UNIT/ML ~~LOC~~ SOLN
0.0000 [IU] | Freq: Three times a day (TID) | SUBCUTANEOUS | Status: DC
  Administered 2011-07-31: 4 [IU] via SUBCUTANEOUS
  Filled 2011-07-31: qty 3

## 2011-07-31 MED ORDER — FERROUS SULFATE 325 (65 FE) MG PO TABS
325.0000 mg | ORAL_TABLET | Freq: Two times a day (BID) | ORAL | Status: DC
  Administered 2011-07-31 – 2011-08-01 (×3): 325 mg via ORAL
  Filled 2011-07-31 (×5): qty 1

## 2011-07-31 MED ORDER — INSULIN ASPART 100 UNIT/ML ~~LOC~~ SOLN: 0.0000 [IU] | Freq: Every day | SUBCUTANEOUS | Status: DC

## 2011-07-31 MED ORDER — ONDANSETRON HCL 4 MG PO TABS: 4.0000 mg | ORAL_TABLET | Freq: Once | ORAL | Status: DC

## 2011-07-31 NOTE — ED Provider Notes (Signed)
History     CSN: [id number]  Arrival date & time 07/30/11  2353   First MD Initiated Contact with Patient 07/31/11 0115      Chief Complaint  Patient presents with  . Hematemesis     HPI  History provided by the patient. Patient is a 61 year old female with history of hypertension, diabetes, CAD, COPD, schizoaffective disorder who presents with complaints of increased difficulty of breathing and episodes of nausea and vomiting last evening. Patient reports just being discharged home earlier Saturday. She was in the hospital for community-acquired pneumonia infection. She was treated with antibiotics over the night and released the next day. Patient states she took her prescription of antibiotics around 9 PM and shortly after developed episodes of nausea and vomiting. She reports up to 4-5 episodes of vomiting before she called 911. Patient denies any blood in vomit to me. Patient also states that she needs to stay in the hospital for her infection and can return home. Patient denies any SI/HI to me.    Past Medical History  Diagnosis Date  . Schizoaffective disorder   . Urinary tract infection   . Hypertension   . Atrial fibrillation   . GERD (gastroesophageal reflux disease)   . Hypothyroidism   . Myocardial infarction   . Diabetes mellitus   . Obesity   . Asthma   . COPD (chronic obstructive pulmonary disease)     Past Surgical History  Procedure Date  . Tubal ligation   . Abdominal hysterectomy     History reviewed. No pertinent family history.  History  Substance Use Topics  . Smoking status: Former Games developer  . Smokeless tobacco: Current User    Types: Snuff, Chew  . Alcohol Use: 1.2 oz/week    2 Glasses of wine per week    OB History    Grav Para Term Preterm Abortions TAB SAB Ect Mult Living                  Review of Systems  Constitutional: Negative for fever and chills.  HENT: Positive for congestion and rhinorrhea.   Respiratory: Positive for cough  and shortness of breath.   Gastrointestinal: Positive for nausea, vomiting and diarrhea. Negative for abdominal pain and constipation.  All other systems reviewed and are negative.    Allergies  Sulfamethoxazole w/trimethoprim and Penicillins  Home Medications   Current Outpatient Rx  Name Route Sig Dispense Refill  . ALBUTEROL 90 MCG/ACT IN AERS Inhalation Inhale 2 puffs into the lungs every 4 (four) hours as needed for wheezing or shortness of breath. 17 g 0  . AMLODIPINE BESYLATE 5 MG PO TABS Oral Take 1 tablet (5 mg total) by mouth daily. For blood pressure 30 tablet 0  . ASPIRIN 81 MG PO TBEC Oral Take 1 tablet (81 mg total) by mouth daily. 30 tablet 0  . CHLORPROMAZINE HCL 25 MG PO TABS  For clear thoughts. Take 1 tablet twice daily and two tabs at bedtime. 120 tablet 0  . DIVALPROEX SODIUM ER 500 MG PO TB24 Oral Take 2 tablets (1,000 mg total) by mouth 2 (two) times daily in the am and at bedtime.. For mood stability. 120 tablet 0  . FERROUS SULFATE 325 (65 FE) MG PO TABS Oral Take 1 tablet (325 mg total) by mouth 2 (two) times daily with a meal. 60 tablet 0  . FOLIC ACID 1 MG PO TABS Oral Take 1 tablet (1 mg total) by mouth daily. Folic Acid (vitamin)replacement 30  tablet 0  . HYDROCODONE-HOMATROPINE 5-1.5 MG/5ML PO SYRP Oral Take 5 mLs by mouth every 6 (six) hours as needed. cough    . LEVOFLOXACIN 750 MG PO TABS Oral Take 1 tablet (750 mg total) by mouth daily. 4 tablet 0  . LEVOTHYROXINE SODIUM 150 MCG PO TABS Oral Take 1 tablet (150 mcg total) by mouth every morning. Thyroid replacement hormone. 30 tablet 0  . METOPROLOL TARTRATE 50 MG PO TABS Oral Take 1 tablet (50 mg total) by mouth 2 (two) times daily. 60 tablet 0  . OMEPRAZOLE 20 MG PO CPDR Oral Take 20 mg by mouth 2 (two) times daily.     Marland Kitchen PREDNISONE 50 MG PO TABS Oral Take 1 tablet (50 mg total) by mouth daily. 4 tablet 0    BP 131/92  Pulse 82  Temp(Src) 97.3 F (36.3 C) (Oral)  Resp 22  SpO2 95%  Physical Exam    Nursing note and vitals reviewed. Constitutional: She is oriented to person, place, and time. She appears well-developed and well-nourished. No distress.       Morbidly obese  HENT:  Head: Normocephalic and atraumatic.  Mouth/Throat: Oropharynx is clear and moist.  Cardiovascular: Normal rate and regular rhythm.   Pulmonary/Chest: Effort normal. No respiratory distress. She has wheezes. She has rales.  Abdominal: Soft. She exhibits no distension. There is no tenderness.  Neurological: She is alert and oriented to person, place, and time.  Skin: Skin is warm and dry. No rash noted.  Psychiatric: She has a normal mood and affect. Her behavior is normal.    ED Course  Procedures    Results for orders placed during the hospital encounter of 07/31/11  CBC      Component Value Range   WBC 11.2 (*) 4.0 - 10.5 (K/uL)   RBC 3.59 (*) 3.87 - 5.11 (MIL/uL)   Hemoglobin 11.9 (*) 12.0 - 15.0 (g/dL)   HCT 60.4 (*) 54.0 - 46.0 (%)   MCV 99.7  78.0 - 100.0 (fL)   MCH 33.1  26.0 - 34.0 (pg)   MCHC 33.2  30.0 - 36.0 (g/dL)   RDW 98.1  19.1 - 47.8 (%)   Platelets 234  150 - 400 (K/uL)  DIFFERENTIAL      Component Value Range   Neutrophils Relative 58  43 - 77 (%)   Neutro Abs 6.5  1.7 - 7.7 (K/uL)   Lymphocytes Relative 28  12 - 46 (%)   Lymphs Abs 3.1  0.7 - 4.0 (K/uL)   Monocytes Relative 14 (*) 3 - 12 (%)   Monocytes Absolute 1.6 (*) 0.1 - 1.0 (K/uL)   Eosinophils Relative 0  0 - 5 (%)   Eosinophils Absolute 0.0  0.0 - 0.7 (K/uL)   Basophils Relative 0  0 - 1 (%)   Basophils Absolute 0.0  0.0 - 0.1 (K/uL)  COMPREHENSIVE METABOLIC PANEL      Component Value Range   Sodium 131 (*) 135 - 145 (mEq/L)   Potassium 4.9  3.5 - 5.1 (mEq/L)   Chloride 94 (*) 96 - 112 (mEq/L)   CO2 30  19 - 32 (mEq/L)   Glucose, Bld 99  70 - 99 (mg/dL)   BUN 12  6 - 23 (mg/dL)   Creatinine, Ser 2.95  0.50 - 1.10 (mg/dL)   Calcium 62.1  8.4 - 10.5 (mg/dL)   Total Protein 7.4  6.0 - 8.3 (g/dL)   Albumin 2.6  (*) 3.5 - 5.2 (g/dL)   AST  19  0 - 37 (U/L)   ALT 16  0 - 35 (U/L)   Alkaline Phosphatase 61  39 - 117 (U/L)   Total Bilirubin 0.2 (*) 0.3 - 1.2 (mg/dL)   GFR calc non Af Amer >90  >90 (mL/min)   GFR calc Af Amer >90  >90 (mL/min)  URINALYSIS, ROUTINE W REFLEX MICROSCOPIC      Component Value Range   Color, Urine YELLOW  YELLOW    APPearance CLEAR  CLEAR    Specific Gravity, Urine 1.007  1.005 - 1.030    pH 7.5  5.0 - 8.0    Glucose, UA NEGATIVE  NEGATIVE (mg/dL)   Hgb urine dipstick NEGATIVE  NEGATIVE    Bilirubin Urine NEGATIVE  NEGATIVE    Ketones, ur NEGATIVE  NEGATIVE (mg/dL)   Protein, ur NEGATIVE  NEGATIVE (mg/dL)   Urobilinogen, UA 0.2  0.0 - 1.0 (mg/dL)   Nitrite NEGATIVE  NEGATIVE    Leukocytes, UA NEGATIVE  NEGATIVE       Dg Chest 2 View  07/31/2011  *RADIOLOGY REPORT*  Clinical Data: Evaluate CAP.  Hypertension.  COPD.  A  CHEST - 2 VIEW  Comparison: 07/29/2011  Findings: Shallow inspiration.  Borderline heart size with normal pulmonary vascularity.  Increasing infiltration in the left lung base posteriorly suggesting developing pneumonia.  No blunting of costophrenic angles.  No pneumothorax.  Tortuous aorta. Degenerative changes in the spine.  IMPRESSION: Slight increase of left basilar infiltration suggesting developing pneumonia.  Original Report Authenticated By: Marlon Pel, M.D.   Dg Chest 2 View  07/29/2011  *RADIOLOGY REPORT*  Clinical Data: Shortness of breath, cough  CHEST - 2 VIEW  Comparison: Portable chest x-ray of 06/21/2011  Findings: On the lateral view there is opacity posteriorly at the lung base probably on the left suspicious for pneumonia or effusion.  The right lung appears clear.  Mild cardiomegaly is stable.  No bony abnormality is seen.  IMPRESSION: Opacity posteriorly on the lateral view suspicious for left lower lobe patchy pneumonia or left effusion.  Original Report Authenticated By: Juline Patch, M.D.     1. Community acquired  pneumonia       MDM  1:20 AM patient seen and evaluated. Patient in no acute distress.  Recent admission and discharge notes reviewed. Patient was discharged home on Levaquin  Patient seen and discussed with attending physician. Will readmit to hospital for continued treatment of pneumonia infection.    Angus Seller, [place] 07/31/11 [phone]

## 2011-07-31 NOTE — H&P (Signed)
[facility] Attending Admit Note  Patient seen and examined by me today, I discussed with Dr Louanne Belton and I agree with his assessment and plan.  I had seen her at her last admission and again yesterday upon discharge, at which time she was very comfortable appearing off of supplemental oxygen and pleading to be discharged.  She had tolerated oral Levaquin in the hospital and so it is surprising to me that she has been readmitted.   At discharge, and again today, she denies any thoughts of self-harm or harming anyone else.  She describes worsening shortness of breath and "feeling chilled" at home, which prompted her to return to the hospital after vomiting up her home Levaquin dose.   Plan to continue to monitor her oxygenation status, tolerance to her antibiotic.  Repeat CXR if worsening shortness of breath or pulm exam.  She is not using supplemental oxygen at the time of my exam.   Paula Compton, MD

## 2011-07-31 NOTE — Progress Notes (Signed)
Patient came to unit 5000 at 0530 this morning 07/31/11.  Patient has been lethargic the entire time.  I can arouse her for just a moment and she falls back to sleep.   I am unable to give her her 0545 Zofran 4 mg PO and her 0630 Levaquin 750 mg PO. Her vitals are stable.

## 2011-07-31 NOTE — ED Notes (Signed)
Pt was seen for pneumonia and discharged.  Pt c/o vomiting blood, diarrhea and wheezing.  No N/V/D while waiting nor with EMS.  PT alert and oriented X4.  Changed to gown and resting with warm blankets.

## 2011-07-31 NOTE — H&P (Signed)
[name] is an 61 y.o. female.   Chief Complaint: difficulty breathing HPI:  History was difficult to obtain given that patient was very sleepy secondary to zofran.  61 year old female with history of asthma/COPD, DM, HTN and schizoaffective disorder who was discharged yesterday (07/30/11) for community acquired pneumonia. Per the ED note, she tried taking Levaquin this evening and had some vomiting and nausea associated with it. She also felt more short of breath.  In the hospital, she was treated for pneumonia with ceftriaxone x1 and azithromycin x1. She was discharged home with Levaquin 750mg .   In the ED, received zofran 4mg  and NS bolus.   Past Medical History  Diagnosis Date  . Schizoaffective disorder   . Urinary tract infection   . Hypertension   . Atrial fibrillation   . GERD (gastroesophageal reflux disease)   . Hypothyroidism   . Myocardial infarction   . Diabetes mellitus   . Obesity   . Asthma   . COPD (chronic obstructive pulmonary disease)     Past Surgical History  Procedure Date  . Tubal ligation   . Abdominal hysterectomy     History reviewed. No pertinent family history. Social History:  reports that she has quit smoking. Her smokeless tobacco use includes Snuff and Chew. She reports that she drinks about 1.2 ounces of alcohol per week. She reports that she does not use illicit drugs.  Allergies:  Allergies  Allergen Reactions  . Sulfamethoxazole W/Trimethoprim Nausea And Vomiting  . Penicillins Rash    Current Outpatient Prescriptions on File Prior to Encounter  Medication Sig Dispense Refill  . albuterol (PROVENTIL,VENTOLIN) 90 MCG/ACT inhaler Inhale 2 puffs into the lungs every 4 (four) hours as needed for wheezing or shortness of breath.  17 g  0  . amLODipine (NORVASC) 5 MG tablet Take 1 tablet (5 mg total) by mouth daily. For blood pressure  30 tablet  0  . aspirin EC 81 MG EC tablet Take 1 tablet (81 mg total) by mouth daily.  30  tablet  0  . chlorproMAZINE (THORAZINE) 25 MG tablet For clear thoughts. Take 1 tablet twice daily and two tabs at bedtime.  120 tablet  0  . divalproex (DEPAKOTE ER) 500 MG 24 hr tablet Take 2 tablets (1,000 mg total) by mouth 2 (two) times daily in the am and at bedtime.. For mood stability.  120 tablet  0  . ferrous sulfate 325 (65 FE) MG tablet Take 1 tablet (325 mg total) by mouth 2 (two) times daily with a meal.  60 tablet  0  . folic acid (FOLVITE) 1 MG tablet Take 1 tablet (1 mg total) by mouth daily. Folic Acid (vitamin)replacement  30 tablet  0  . HYDROcodone-homatropine (HYCODAN) 5-1.5 MG/5ML syrup Take 5 mLs by mouth every 6 (six) hours as needed. cough      . levofloxacin (LEVAQUIN) 750 MG tablet Take 1 tablet (750 mg total) by mouth daily.  4 tablet  0  . levothyroxine (SYNTHROID, LEVOTHROID) 150 MCG tablet Take 1 tablet (150 mcg total) by mouth every morning. Thyroid replacement hormone.  30 tablet  0  . metoprolol (LOPRESSOR) 50 MG tablet Take 1 tablet (50 mg total) by mouth 2 (two) times daily.  60 tablet  0  . omeprazole (PRILOSEC) 20 MG capsule Take 20 mg by mouth 2 (two) times daily.       . predniSONE (DELTASONE) 50 MG tablet Take 1 tablet (50 mg total) by mouth  daily.  4 tablet  0     Results for orders placed during the hospital encounter of 07/31/11 (from the past 48 hour(s))  CBC     Status: Abnormal   Collection Time   07/31/11  1:51 AM      Component Value Range Comment   WBC 11.2 (*) 4.0 - 10.5 (K/uL)    RBC 3.59 (*) 3.87 - 5.11 (MIL/uL)    Hemoglobin 11.9 (*) 12.0 - 15.0 (g/dL)    HCT 78.2 (*) 95.6 - 46.0 (%)    MCV 99.7  78.0 - 100.0 (fL)    MCH 33.1  26.0 - 34.0 (pg)    MCHC 33.2  30.0 - 36.0 (g/dL)    RDW 21.3  08.6 - 57.8 (%)    Platelets 234  150 - 400 (K/uL)   DIFFERENTIAL     Status: Abnormal   Collection Time   07/31/11  1:51 AM      Component Value Range Comment   Neutrophils Relative 58  43 - 77 (%)    Neutro Abs 6.5  1.7 - 7.7 (K/uL)     Lymphocytes Relative 28  12 - 46 (%)    Lymphs Abs 3.1  0.7 - 4.0 (K/uL)    Monocytes Relative 14 (*) 3 - 12 (%)    Monocytes Absolute 1.6 (*) 0.1 - 1.0 (K/uL)    Eosinophils Relative 0  0 - 5 (%)    Eosinophils Absolute 0.0  0.0 - 0.7 (K/uL)    Basophils Relative 0  0 - 1 (%)    Basophils Absolute 0.0  0.0 - 0.1 (K/uL)   COMPREHENSIVE METABOLIC PANEL     Status: Abnormal   Collection Time   07/31/11  1:51 AM      Component Value Range Comment   Sodium 131 (*) 135 - 145 (mEq/L)    Potassium 4.9  3.5 - 5.1 (mEq/L) HEMOLYSIS AT THIS LEVEL MAY AFFECT RESULT   Chloride 94 (*) 96 - 112 (mEq/L)    CO2 30  19 - 32 (mEq/L)    Glucose, Bld 99  70 - 99 (mg/dL)    BUN 12  6 - 23 (mg/dL)    Creatinine, Ser 4.69  0.50 - 1.10 (mg/dL)    Calcium 62.9  8.4 - 10.5 (mg/dL)    Total Protein 7.4  6.0 - 8.3 (g/dL)    Albumin 2.6 (*) 3.5 - 5.2 (g/dL)    AST 19  0 - 37 (U/L) HEMOLYSIS AT THIS LEVEL MAY AFFECT RESULT   ALT 16  0 - 35 (U/L)    Alkaline Phosphatase 61  39 - 117 (U/L)    Total Bilirubin 0.2 (*) 0.3 - 1.2 (mg/dL)    GFR calc non Af Amer >90  >90 (mL/min)    GFR calc Af Amer >90  >90 (mL/min)   URINALYSIS, ROUTINE W REFLEX MICROSCOPIC     Status: Normal   Collection Time   07/31/11  4:17 AM      Component Value Range Comment   Color, Urine YELLOW  YELLOW     APPearance CLEAR  CLEAR     Specific Gravity, Urine 1.007  1.005 - 1.030     pH 7.5  5.0 - 8.0     Glucose, UA NEGATIVE  NEGATIVE (mg/dL)    Hgb urine dipstick NEGATIVE  NEGATIVE     Bilirubin Urine NEGATIVE  NEGATIVE     Ketones, ur NEGATIVE  NEGATIVE (mg/dL)    Protein, ur  NEGATIVE  NEGATIVE (mg/dL)    Urobilinogen, UA 0.2  0.0 - 1.0 (mg/dL)    Nitrite NEGATIVE  NEGATIVE     Leukocytes, UA NEGATIVE  NEGATIVE  MICROSCOPIC NOT DONE ON URINES WITH NEGATIVE PROTEIN, BLOOD, LEUKOCYTES, NITRITE, OR GLUCOSE <1000 mg/dL.   Dg Chest 2 View  07/31/2011  *RADIOLOGY REPORT*  Clinical Data: Evaluate CAP.  Hypertension.  COPD.  A  CHEST - 2  VIEW  Comparison: 07/29/2011  Findings: Shallow inspiration.  Borderline heart size with normal pulmonary vascularity.  Increasing infiltration in the left lung base posteriorly suggesting developing pneumonia.  No blunting of costophrenic angles.  No pneumothorax.  Tortuous aorta. Degenerative changes in the spine.  IMPRESSION: Slight increase of left basilar infiltration suggesting developing pneumonia.  Original Report Authenticated By: Marlon Pel, M.D.   Dg Chest 2 View  07/29/2011  *RADIOLOGY REPORT*  Clinical Data: Shortness of breath, cough  CHEST - 2 VIEW  Comparison: Portable chest x-ray of 06/21/2011  Findings: On the lateral view there is opacity posteriorly at the lung base probably on the left suspicious for pneumonia or effusion.  The right lung appears clear.  Mild cardiomegaly is stable.  No bony abnormality is seen.  IMPRESSION: Opacity posteriorly on the lateral view suspicious for left lower lobe patchy pneumonia or left effusion.  Original Report Authenticated By: Juline Patch, M.D.    ROS Level 5 caviot, pt just wanted to sleep  Blood pressure 132/80, pulse 74, temperature 98.4 F (36.9 C), temperature source Oral, resp. rate 22, SpO2 98.00%. Physical Exam  Constitutional: She appears well-nourished.       Sleeping and difficult to wake up.   HENT:  Mouth/Throat: Oropharynx is clear and moist.  Eyes: Conjunctivae and EOM are normal. Pupils are equal, round, and reactive to light.  Cardiovascular: Normal rate, regular rhythm and normal heart sounds.   No murmur heard. Respiratory: Effort normal. No respiratory distress. She has wheezes.       Breathing comfortably with nasal canula in place. Expiratory wheezes present as well as diffuse coarse breath sounds and scattered crackles.   GI: Soft. She exhibits no distension. There is no tenderness. There is no rebound.  Musculoskeletal: She exhibits edema.       +1 pitting edema up to below the knee  Skin: Skin is warm  and dry.     Assessment/Plan 61 yo female with history of asthma/copd, schizoaffective disorder, DM and HTN who was discharged from the hospital less than 24hrs ago for pneumonia and presented with nausea and vomiting and worsening shortness of breath at home.  1. Community acquired pneumonia: since patient did not show any decrease oxygen saturation in the ED and is not febrile, it does not appear that her pneumonia is clinically worsening. She will be admitted for observation and restarted on Levofloxacin with coadministration of zofran in case of vomiting.  - oxygen as needed for O2 sat<90%, wean oxygen as tolerated 2. COPD/Asthma: with some wheezing on exam.  - duonebs q6 - prednisone 50mg  daily 3. Schizoaffective disorder:  Resume home meds: thorazine, [name] and depakote 4. Hypertension: stable. Continue amlodipine 5mg  daily and metoprolol 50mg  bid.  5. Hypothyroid: continue synthroid daily 6. DM:  - insulin sliding scale with carb modified diet 7. Fen/Gi: - carb modified diet 8. Prophylaxis: lovenox 40mg  qd, zofran 4mg  po for nausea 9. Dispo:  - admit to med surgical floor for observation - home pending no oxygen requirement and able to tolerate antibiotics.  Marena Chancy 07/31/2011, 4:57 AM  Upper Level Addendum: Patient seen and examined by myself.  I agree with the excellent note by Dr. Gwenlyn Saran with the following additions: At the time of exam patient was sleeping and generally refused to be awakened (she would open her eyes look at me and then purposefully close her eyes and ignore me). When she was awakened she denied any current nausea vomiting but did report she had been having a little bit of trouble breathing at home. The patient's vital signs were noted to be normal and her oxygen saturation was 91-92% on room air while she was fast asleep and snoring. The patient did have occasional scattered wheezes and decreased breath sounds throughout although her morbid  obesity makes a full examination of her lungs somewhat problematic. The patient is afebrile and does not report any fevers at home.  The patient has a large number of psychiatric comorbidities. She is reporting emesis and worsening shortness of breath. However, she does not have any objective findings suggesting either failure of Levaquin or significant worsening of her symptoms. Accordingly, we will place her back on Levaquin and give Zofran for nausea. I am generally of the opinion that the patient is back here due to her psychiatric problems and less so from her pneumonia. We will watch her closely, wean oxygen as tolerated, and a very low threshold to involve psychiatry in the care of this patient.  Daaiel Starlin 07/31/2011, 9:23 AM

## 2011-07-31 NOTE — ED Provider Notes (Signed)
Medical screening examination/treatment/procedure(s) were conducted as a shared visit with non-physician practitioner(s) and myself.  I personally evaluated the patient during the encounter  Patient seen and examined. Will be admitted for her pneumonia  Toy Baker, MD 07/31/11 [phone]

## 2011-07-31 NOTE — Progress Notes (Signed)
Patient has been lethargic but I am able to arouse her momentarily then she falls back to sleep.  Vitals are stable however I am unable to give her her scheduled Zofran 4 mg PO and her scheduled Levaquin 750 mg PO.

## 2011-07-31 NOTE — ED Notes (Signed)
Report called to [facility] on floor and patient moved to room 5018.

## 2011-08-01 LAB — DIFFERENTIAL
Basophils Absolute: 0 10*3/uL (ref 0.0–0.1)
Basophils Relative: 0 % (ref 0–1)
Neutro Abs: 7.2 10*3/uL (ref 1.7–7.7)
Neutrophils Relative %: 57 % (ref 43–77)

## 2011-08-01 LAB — URINE CULTURE
Colony Count: NO GROWTH
Culture: NO GROWTH

## 2011-08-01 LAB — GLUCOSE, CAPILLARY: Glucose-Capillary: 114 mg/dL — ABNORMAL HIGH (ref 70–99)

## 2011-08-01 LAB — CBC
MCHC: 31.8 g/dL (ref 30.0–36.0)
Platelets: 255 10*3/uL (ref 150–400)
RDW: 14.5 % (ref 11.5–15.5)

## 2011-08-01 MED ORDER — DIVALPROEX SODIUM ER 500 MG PO TB24: 1000.0000 mg | ORAL_TABLET | ORAL | Status: DC

## 2011-08-01 MED ORDER — ONDANSETRON HCL 4 MG PO TABS: 4.0000 mg | ORAL_TABLET | Freq: Four times a day (QID) | ORAL | Status: AC | PRN

## 2011-08-01 MED ORDER — ALBUTEROL 90 MCG/ACT IN AERS: 2.0000 | INHALATION_SPRAY | Freq: Four times a day (QID) | RESPIRATORY_TRACT | Status: DC | PRN

## 2011-08-01 MED ORDER — CHLORPROMAZINE HCL 25 MG PO TABS: ORAL_TABLET | ORAL | Status: DC

## 2011-08-01 MED ORDER — METOPROLOL TARTRATE 50 MG PO TABS: 50.0000 mg | ORAL_TABLET | Freq: Two times a day (BID) | ORAL | Status: DC

## 2011-08-01 MED ORDER — AMLODIPINE BESYLATE 5 MG PO TABS: 5.0000 mg | ORAL_TABLET | Freq: Every day | ORAL | Status: DC

## 2011-08-01 MED ORDER — FOLIC ACID 1 MG PO TABS: 1.0000 mg | ORAL_TABLET | Freq: Every day | ORAL | Status: DC

## 2011-08-01 MED ORDER — LEVOTHYROXINE SODIUM 150 MCG PO TABS: 150.0000 ug | ORAL_TABLET | Freq: Every day | ORAL | Status: DC

## 2011-08-01 MED ORDER — LEVOFLOXACIN 750 MG PO TABS: 750.0000 mg | ORAL_TABLET | Freq: Every day | ORAL | Status: AC

## 2011-08-01 MED ORDER — PALIPERIDONE ER 6 MG PO TB24: 6.0000 mg | ORAL_TABLET | Freq: Two times a day (BID) | ORAL | Status: DC

## 2011-08-01 NOTE — Progress Notes (Signed)
Seen and examined.  Agree with Dr. Gwenlyn Saran. Patient feels breathing is back to baseline and no nausea.  She is anxious to go home today.  I agree with plan to DC.  Her psych issues will always make her at risk for readmit - still, I am willing to take that leap of faith.

## 2011-08-01 NOTE — ED Provider Notes (Signed)
Medical screening examination/treatment/procedure(s) were conducted as a shared visit with non-physician practitioner(s) and myself.  I personally evaluated the patient during the encounter  Toy Baker, MD 08/01/11 [phone]

## 2011-08-01 NOTE — Progress Notes (Signed)
Subjective: Feeling better today. Shortness of breath has improved. Has been on room air since 2pm yesterday.  Had episode of low blood pressure at 82/58 yesterday and required 500cc NS with improvement of blood pressure to 130's/91's.   Objective: Vital signs in last 24 hours: Temp:  [98.2 F (36.8 C)-98.7 F (37.1 C)] 98.7 F (37.1 C) (02/11 0549) Pulse Rate:  [77-84] 82  (02/11 0549) Resp:  [20] 20  (02/11 0549) BP: (82-133)/(58-91) 133/71 mmHg (02/11 0549) SpO2:  [90 %-96 %] 90 % (02/11 0549) Weight change:  Last BM Date: 07/31/11  Intake/Output from previous day:   Intake/Output this shift:    General appearance: alert and has some slurred speech at occasion. Eating breakfast comfortably in bed.  Resp: normal work of breathing, no wheezing, no crackles, coarse breath sounds in the lower lobes.  Cardio: regular rate and rhythm, S1, S2 normal, no murmur, click, rub or gallop GI: soft, non-tender; bowel sounds normal; no masses,  no organomegaly Extremities: trace edema.  Lab Results:  Basename 07/31/11 0151 07/30/11 0630  WBC 11.2* 7.3  HGB 11.9* 12.1  HCT 35.8* 38.4  PLT 234 223   BMET  Basename 07/31/11 0151 07/30/11 0630  NA 131* 131*  K 4.9 4.5  CL 94* 95*  CO2 30 27  GLUCOSE 99 171*  BUN 12 10  CREATININE 0.76 0.66  CALCIUM 10.0 9.9    Studies/Results: Dg Chest 2 View  07/31/2011  *RADIOLOGY REPORT*  Clinical Data: Evaluate CAP.  Hypertension.  COPD.  A  CHEST - 2 VIEW  Comparison: 07/29/2011  Findings: Shallow inspiration.  Borderline heart size with normal pulmonary vascularity.  Increasing infiltration in the left lung base posteriorly suggesting developing pneumonia.  No blunting of costophrenic angles.  No pneumothorax.  Tortuous aorta. Degenerative changes in the spine.  IMPRESSION: Slight increase of left basilar infiltration suggesting developing pneumonia.  Original Report Authenticated By: Marlon Pel, M.D.    Medications: I have reviewed  the patient's current medications.  Assessment/Plan: 61 yo female with h/o schizoaffective disorder, DM, HTN and recent pneumonia who presented with worsening shortness of breath after being discharged from the hospital for community acquired pneumonia.  1. Community acquired pneumonia: - patient is afebrile, comfortable on room air  - on levofloxacin: has received one dose in the hospital. Did not tolerate her home dose on discharge. Had received ceftriaxone and azithromycin x1 at her last hospitalization on 07/29/11. Will need to be discharged on levaquin for a course of 7-10 days of total coverage.  2. COPD/Asthma: - duonebs q6  - prednisone 50mg  daily  3. Schizoaffective disorder:  Resume home meds: thorazine, [name] and depakote. Patient voicing need for prescriptions 4. Hypertension: stable. Continue amlodipine 5mg  daily and metoprolol 50mg  bid.  5. Hypothyroid: continue synthroid daily  6. DM:  - insulin sliding scale with carb modified diet  7. Fen/Gi:  - carb modified diet  8. Prophylaxis: lovenox 40mg  qd, zofran 4mg  po for nausea  9. Dispo:  - home today     LOS: 1 day   Orla Estrin 08/01/2011, 9:37 AM

## 2011-08-03 NOTE — Discharge Summary (Signed)
Physician Discharge Summary   Patient ID: [name] [id number] 61 y.o. 1950-06-22  Admit date: 07/31/2011  Discharge date and time: 08/01/2011  1:26 PM   Admitting Physician: Marena Chancy, MD   Discharge Physician: Dr. Tivis Ringer  Admission Diagnoses: Community Acquired Pneumonia Asthma/COPD Nausea/Vomiting Discharge Diagnoses:  Community Acquired Pneumonia Asthma/COPD Schizoaffective Disorder  Admission Condition: fair  Discharged Condition: good  Indication for Admission: vomiting and community acquired pneumonia  Hospital Course:  61 year old female with history of asthma/COPD, DM, HTN and schizoaffective disorder, discharged the previous day for community acquired pneumonia who presented to the ED with vomiting, inability to keep down levofloxacin and complaint of increased shortness of breath at night. Patient was kept on levofloxacin which was administered with zofran to help with nausea. Duonebs were also started since there was a component of asthma in her history. She was initially on 2L [place] but was quickly weaned to room air. Patient remained in the high 90's on room air and had comfortable work of breathing. On the day of discharge, patient was tolerating oral antibiotics, was afebrile and was comfortable breathing on room air. Patient was discharged with 7 more days of levofloxacin for a 10 days of total antibiotic coverage.  2. Schizoaffective disorder: On this admission, patient did not voice any suicidal ideations like she had in her previous admission. Her home meds were resumed: thorazine, [name] and depakote. Patient was instructed to follow up with her psychiatrist 3. Hypertension: remained stable through admission. Patient discharged on home  amlodipine 5mg  daily and metoprolol 50mg  bid.  4. Hypothyroid: stable. Patient to continue synthroid daily  5. Diabetes: patient was on a sliding scale and remained stable with glucose 87-151.  Consults:  None  Significant Diagnostic Studies:  Chest Xray 07/31/11: IMPRESSION:  Slight increase of left basilar infiltration suggesting developing  pneumonia.  CBC    Component Value Date/Time   WBC 12.6* 08/01/2011 0915   RBC 3.85* 08/01/2011 0915   HGB 12.6 08/01/2011 0915   HCT 39.6 08/01/2011 0915   PLT 255 08/01/2011 0915   MCV 102.9* 08/01/2011 0915   MCH 32.7 08/01/2011 0915   MCHC 31.8 08/01/2011 0915   RDW 14.5 08/01/2011 0915   LYMPHSABS 4.2* 08/01/2011 0915   MONOABS 1.2* 08/01/2011 0915   EOSABS 0.0 08/01/2011 0915   BASOSABS 0.0 08/01/2011 0915    CMP     Component Value Date/Time   NA 131* 07/31/2011 0151   K 4.9 07/31/2011 0151   CL 94* 07/31/2011 0151   CO2 30 07/31/2011 0151   GLUCOSE 99 07/31/2011 0151   BUN 12 07/31/2011 0151   CREATININE 0.76 07/31/2011 0151   CALCIUM 10.0 07/31/2011 0151   PROT 7.4 07/31/2011 0151   ALBUMIN 2.6* 07/31/2011 0151   AST 19 07/31/2011 0151   ALT 16 07/31/2011 0151   ALKPHOS 61 07/31/2011 0151   BILITOT 0.2* 07/31/2011 0151   GFRNONAA >90 07/31/2011 0151   GFRAA >90 07/31/2011 0151    Treatments:  Levofloxacin 750mg  daily zofran 4mg   Discharge Exam: Vital signs in last 24 hours:  Temp: [98.2 F (36.8 C)-98.7 F (37.1 C)] 98.7 F (37.1 C) (02/11 0549)  Pulse Rate: [77-84] 82 (02/11 0549)  Resp: [20] 20 (02/11 0549)  BP: (82-133)/(58-91) 133/71 mmHg (02/11 0549)  SpO2: [90 %-96 %] 90 % (02/11 0549)  Weight change:  Last BM Date: 07/31/11  Intake/Output from previous day:  Intake/Output this shift:  General appearance: alert and has some slurred  speech at occasion. Eating breakfast comfortably in bed.  Resp: normal work of breathing, no wheezing, no crackles, coarse breath sounds in the lower lobes.  Cardio: regular rate and rhythm, S1, S2 normal, no murmur, click, rub or gallop  GI: soft, non-tender; bowel sounds normal; no masses, no organomegaly  Extremities: trace edema.   Disposition: Home or Self Care  Patient Instructions:   Medication List  As of 08/03/2011  9:52 PM   STOP taking these medications         predniSONE 50 MG tablet         TAKE these medications         albuterol 90 MCG/ACT inhaler   Commonly known as: PROVENTIL,VENTOLIN   Inhale 2 puffs into the lungs every 6 (six) hours as needed for wheezing or shortness of breath.      amLODipine 5 MG tablet   Commonly known as: NORVASC   Take 1 tablet (5 mg total) by mouth daily. For blood pressure      aspirin 81 MG EC tablet   Take 1 tablet (81 mg total) by mouth daily.      chlorproMAZINE 25 MG tablet   Commonly known as: THORAZINE   For clear thoughts. Take 1 tablet twice daily and two tabs at bedtime.      divalproex 500 MG 24 hr tablet   Commonly known as: DEPAKOTE ER   Take 2 tablets (1,000 mg total) by mouth 2 (two) times daily in the am and at bedtime.. For mood stability.      ferrous sulfate 325 (65 FE) MG tablet   Take 1 tablet (325 mg total) by mouth 2 (two) times daily with a meal.      folic acid 1 MG tablet   Commonly known as: FOLVITE   Take 1 tablet (1 mg total) by mouth daily. Folic Acid (vitamin)replacement      HYDROcodone-homatropine 5-1.5 MG/5ML syrup   Commonly known as: HYCODAN   Take 5 mLs by mouth every 6 (six) hours as needed. cough      levofloxacin 750 MG tablet   Commonly known as: LEVAQUIN   Take 1 tablet (750 mg total) by mouth daily. Take for another 10 days.      levothyroxine 150 MCG tablet   Commonly known as: SYNTHROID, LEVOTHROID   Take 1 tablet (150 mcg total) by mouth daily with breakfast.      metoprolol 50 MG tablet   Commonly known as: LOPRESSOR   Take 1 tablet (50 mg total) by mouth 2 (two) times daily.      omeprazole 20 MG capsule   Commonly known as: PRILOSEC   Take 20 mg by mouth 2 (two) times daily.      ondansetron 4 MG tablet   Commonly known as: ZOFRAN   Take 1 tablet (4 mg total) by mouth every 6 (six) hours as needed. Take zofran with your antibiotics levofloxacin to help  with nausea.      paliperidone 6 MG 24 hr tablet   Commonly known as: [name]   Take 1 tablet (6 mg total) by mouth 2 (two) times daily.           Activity: activity as tolerated Diet: diabetic diet Wound Care: none needed  Follow-up with Julieanne Manson   Signed: Marena Chancy 08/03/2011 9:52 PM

## 2011-08-04 NOTE — Discharge Summary (Signed)
Patient was seen and examined on the day of DC.  Agree with DC documentation and plan as outlined by Dr. Gwenlyn Saran.

## 2011-08-05 LAB — CULTURE, BLOOD (ROUTINE X 2)
Culture  Setup Time: 201302090435
Culture: NO GROWTH
Culture: NO GROWTH

## 2011-08-10 ENCOUNTER — Encounter (HOSPITAL_COMMUNITY): Payer: Self-pay

## 2011-08-10 ENCOUNTER — Other Ambulatory Visit: Payer: Self-pay

## 2011-08-10 ENCOUNTER — Emergency Department (HOSPITAL_COMMUNITY)
Admission: EM | Admit: 2011-08-10 | Discharge: 2011-08-10 | Discharge status: Against Medical Advice | Payer: Medicare Other | Attending: Emergency Medicine | Admitting: Emergency Medicine

## 2011-08-10 DIAGNOSIS — R05 Cough: Secondary | ICD-10-CM | POA: Insufficient documentation

## 2011-08-10 DIAGNOSIS — R0602 Shortness of breath: Secondary | ICD-10-CM | POA: Insufficient documentation

## 2011-08-10 DIAGNOSIS — R059 Cough, unspecified: Secondary | ICD-10-CM | POA: Insufficient documentation

## 2011-08-10 DIAGNOSIS — R509 Fever, unspecified: Secondary | ICD-10-CM | POA: Insufficient documentation

## 2011-08-10 NOTE — ED Notes (Signed)
Pt presents with increased shortness of breath, cough and infection x 4 months.  Pt was discharged from here x 2 weeks ago with pneumonia, reports she received a letter to return due to infection "in my heart and in both of my lungs".  +fever.  Pt continues to have productive cough green and yellow phlegm. +shortness of breath.

## 2011-08-10 NOTE — ED Notes (Signed)
Upon assessing pt, pt states that she does not want to be seen anymore and wants to go home

## 2011-08-15 DIAGNOSIS — R109 Unspecified abdominal pain: Secondary | ICD-10-CM | POA: Insufficient documentation

## 2011-08-15 DIAGNOSIS — R079 Chest pain, unspecified: Secondary | ICD-10-CM | POA: Insufficient documentation

## 2011-08-16 ENCOUNTER — Emergency Department (HOSPITAL_COMMUNITY): Payer: Medicare Other

## 2011-08-16 ENCOUNTER — Ambulatory Visit (HOSPITAL_COMMUNITY): Admission: RE | Admit: 2011-08-16 | Payer: Medicare Other | Source: Ambulatory Visit

## 2011-08-16 ENCOUNTER — Other Ambulatory Visit: Payer: Self-pay

## 2011-08-16 ENCOUNTER — Emergency Department (HOSPITAL_COMMUNITY)
Admission: EM | Admit: 2011-08-16 | Discharge: 2011-08-16 | Discharge status: Against Medical Advice | Payer: Medicare Other | Attending: Emergency Medicine | Admitting: Emergency Medicine

## 2011-08-16 ENCOUNTER — Encounter (HOSPITAL_COMMUNITY): Payer: Self-pay | Admitting: *Deleted

## 2011-08-16 HISTORY — DX: Pneumonia, unspecified organism: J18.9

## 2011-08-16 NOTE — ED Notes (Signed)
Multiple sx/complaints: c/o back pain, abd pain, CP, sob, unable to urinate (r/t kidney problems), "currently has PNA", dx'd with PNA 2 weeks ago, alert, NAD, calm, interactive, congested cough noted, resps e/u, speaking in clear complete sentences.

## 2011-08-19 ENCOUNTER — Encounter (HOSPITAL_COMMUNITY): Payer: Self-pay | Admitting: *Deleted

## 2011-08-19 ENCOUNTER — Emergency Department (HOSPITAL_COMMUNITY): Payer: Medicare Other

## 2011-08-19 ENCOUNTER — Emergency Department (HOSPITAL_COMMUNITY)
Admission: EM | Admit: 2011-08-19 | Discharge: 2011-08-19 | Disposition: A | Discharge status: Home / Self Care | Payer: Medicare Other | Attending: Emergency Medicine | Admitting: Emergency Medicine

## 2011-08-19 ENCOUNTER — Other Ambulatory Visit: Payer: Self-pay

## 2011-08-19 DIAGNOSIS — E119 Type 2 diabetes mellitus without complications: Secondary | ICD-10-CM | POA: Insufficient documentation

## 2011-08-19 DIAGNOSIS — J4489 Other specified chronic obstructive pulmonary disease: Secondary | ICD-10-CM | POA: Insufficient documentation

## 2011-08-19 DIAGNOSIS — K219 Gastro-esophageal reflux disease without esophagitis: Secondary | ICD-10-CM | POA: Insufficient documentation

## 2011-08-19 DIAGNOSIS — J449 Chronic obstructive pulmonary disease, unspecified: Secondary | ICD-10-CM | POA: Insufficient documentation

## 2011-08-19 DIAGNOSIS — Z7982 Long term (current) use of aspirin: Secondary | ICD-10-CM | POA: Insufficient documentation

## 2011-08-19 DIAGNOSIS — R079 Chest pain, unspecified: Secondary | ICD-10-CM | POA: Insufficient documentation

## 2011-08-19 DIAGNOSIS — Z79899 Other long term (current) drug therapy: Secondary | ICD-10-CM | POA: Insufficient documentation

## 2011-08-19 DIAGNOSIS — I10 Essential (primary) hypertension: Secondary | ICD-10-CM | POA: Insufficient documentation

## 2011-08-19 DIAGNOSIS — Z8659 Personal history of other mental and behavioral disorders: Secondary | ICD-10-CM | POA: Insufficient documentation

## 2011-08-19 DIAGNOSIS — E039 Hypothyroidism, unspecified: Secondary | ICD-10-CM | POA: Insufficient documentation

## 2011-08-19 DIAGNOSIS — I252 Old myocardial infarction: Secondary | ICD-10-CM | POA: Insufficient documentation

## 2011-08-19 DIAGNOSIS — I4891 Unspecified atrial fibrillation: Secondary | ICD-10-CM | POA: Insufficient documentation

## 2011-08-19 DIAGNOSIS — F17200 Nicotine dependence, unspecified, uncomplicated: Secondary | ICD-10-CM | POA: Insufficient documentation

## 2011-08-19 LAB — CBC
MCV: 100.8 fL — ABNORMAL HIGH (ref 78.0–100.0)
Platelets: 161 10*3/uL (ref 150–400)
RDW: 13.4 % (ref 11.5–15.5)
WBC: 6.8 10*3/uL (ref 4.0–10.5)

## 2011-08-19 LAB — URINALYSIS, ROUTINE W REFLEX MICROSCOPIC
Glucose, UA: NEGATIVE mg/dL
Ketones, ur: NEGATIVE mg/dL
Leukocytes, UA: NEGATIVE
Nitrite: NEGATIVE
Specific Gravity, Urine: 1.011 (ref 1.005–1.030)
pH: 7 (ref 5.0–8.0)

## 2011-08-19 LAB — DIFFERENTIAL
Basophils Absolute: 0 10*3/uL (ref 0.0–0.1)
Lymphocytes Relative: 37 % (ref 12–46)
Lymphs Abs: 2.5 10*3/uL (ref 0.7–4.0)
Neutro Abs: 3.3 10*3/uL (ref 1.7–7.7)

## 2011-08-19 LAB — BASIC METABOLIC PANEL
CO2: 31 mEq/L (ref 19–32)
Chloride: 100 mEq/L (ref 96–112)
Creatinine, Ser: 0.71 mg/dL (ref 0.50–1.10)
GFR calc Af Amer: >90 mL/min (ref >90)
Potassium: 4 mEq/L (ref 3.5–5.1)
Sodium: 136 mEq/L (ref 135–145)

## 2011-08-19 LAB — TROPONIN I: Troponin I: <0.3 ng/mL

## 2011-08-19 NOTE — ED Notes (Signed)
Patient was discharge from here with pneumonia and has had chest pain today with deep inspirations and movement. She denies SOB, Radiation, and Nausea or Vomiting.

## 2011-08-19 NOTE — ED Provider Notes (Signed)
History     CSN: [id number]  Arrival date & time 08/19/11  1554   First MD Initiated Contact with Patient 08/19/11 1605      Chief Complaint  Patient presents with  . Chest Pain    (Consider location/radiation/quality/duration/timing/severity/associated sxs/prior treatment) Patient is a 61 y.o. female presenting with chest pain. The history is provided by the patient (pt comlains of chest pain). No language interpreter was used.  Chest Pain The chest pain began less than 1 hour ago. Chest pain occurs rarely. The chest pain is resolved. The pain is associated with lifting. At its most intense, the pain is at 3/10. The pain is currently at 0/10. The severity of the pain is moderate. The quality of the pain is described as aching. The pain does not radiate. Chest pain is worsened by certain positions. Pertinent negatives for primary symptoms include no fever, no fatigue, no cough and no abdominal pain. She tried nothing for the symptoms.  Pertinent negatives for past medical history include no aneurysm and no seizures.     Past Medical History  Diagnosis Date  . Schizoaffective disorder   . Urinary tract infection   . Hypertension   . Atrial fibrillation   . GERD (gastroesophageal reflux disease)   . Hypothyroidism   . Myocardial infarction   . Diabetes mellitus   . Obesity   . Asthma   . COPD (chronic obstructive pulmonary disease)   . Pneumonia     Past Surgical History  Procedure Date  . Tubal ligation   . Abdominal hysterectomy     History reviewed. No pertinent family history.  History  Substance Use Topics  . Smoking status: Former Games developer  . Smokeless tobacco: Current User    Types: Snuff, Chew  . Alcohol Use: 1.2 oz/week    2 Glasses of wine per week    OB History    Grav Para Term Preterm Abortions TAB SAB Ect Mult Living                  Review of Systems  Constitutional: Negative for fever and fatigue.  HENT: Negative for congestion, sinus pressure  and ear discharge.   Eyes: Negative for discharge.  Respiratory: Negative for cough.   Cardiovascular: Positive for chest pain.  Gastrointestinal: Negative for abdominal pain and diarrhea.  Genitourinary: Negative for frequency and hematuria.  Musculoskeletal: Negative for back pain.  Skin: Negative for rash.  Neurological: Negative for seizures and headaches.  Hematological: Negative.   Psychiatric/Behavioral: Negative for hallucinations.    Allergies  Sulfamethoxazole w/trimethoprim and Penicillins  Home Medications   Current Outpatient Rx  Name Route Sig Dispense Refill  . ALBUTEROL SULFATE HFA 108 (90 BASE) MCG/ACT IN AERS Inhalation Inhale 2 puffs into the lungs every 6 (six) hours as needed. For shortness of breath    . ASPIRIN EC 81 MG PO TBEC Oral Take 81 mg by mouth daily.    . CHLORPROMAZINE HCL 25 MG PO TABS Oral Take 25-50 mg by mouth 2 (two) times daily. Take 1 tablet in the morning and 2 tablets at bedtime    . DIVALPROEX SODIUM ER 500 MG PO TB24 Oral Take 1,000 mg by mouth 2 (two) times daily.    Marland Kitchen FERROUS SULFATE 325 (65 FE) MG PO TABS Oral Take 325 mg by mouth 2 (two) times daily.    Marland Kitchen FOLIC ACID 1 MG PO TABS Oral Take 1 mg by mouth daily.    Marland Kitchen HYDROCODONE-HOMATROPINE 5-1.5 MG/5ML PO  SYRP Oral Take 5 mLs by mouth every 6 (six) hours as needed. For cough    . LEVOTHYROXINE SODIUM 150 MCG PO TABS Oral Take 150 mcg by mouth daily.    Marland Kitchen METOPROLOL TARTRATE 50 MG PO TABS Oral Take 50 mg by mouth 2 (two) times daily.    Marland Kitchen OMEPRAZOLE 20 MG PO CPDR Oral Take 20 mg by mouth 2 (two) times daily.     Marland Kitchen PALIPERIDONE ER 6 MG PO TB24 Oral Take 6 mg by mouth 2 (two) times daily.      BP 133/90  Pulse 70  Temp 97.6 F (36.4 C)  Resp 16  Ht 5\' 2"  (1.575 m)  Wt 339 lb (153.769 kg)  BMI 62.00 kg/m2  SpO2 100%  Physical Exam  Constitutional: She is oriented to person, place, and time. She appears well-developed.  HENT:  Head: Normocephalic and atraumatic.  Eyes:  Conjunctivae and EOM are normal. No scleral icterus.  Neck: Neck supple. No thyromegaly present.  Cardiovascular: Normal rate and regular rhythm.  Exam reveals no gallop and no friction rub.   No murmur heard. Pulmonary/Chest: No stridor. She has no wheezes. She has no rales. She exhibits no tenderness.  Abdominal: She exhibits no distension. There is no tenderness. There is no rebound.  Musculoskeletal: Normal range of motion. She exhibits no edema.  Lymphadenopathy:    She has no cervical adenopathy.  Neurological: She is oriented to person, place, and time. Coordination normal.  Skin: No rash noted. No erythema.  Psychiatric: She has a normal mood and affect. Her behavior is normal.    ED Course  Procedures (including critical care time)  Labs Reviewed  CBC - Abnormal; Notable for the following:    RBC 3.70 (*)    MCV 100.8 (*)    All other components within normal limits  DIFFERENTIAL  TROPONIN I  BASIC METABOLIC PANEL  URINALYSIS, ROUTINE W REFLEX MICROSCOPIC   Dg Chest Portable 1 View  08/19/2011  *RADIOLOGY REPORT*  Clinical Data: Chest pain today.  History of pneumonia and urinary tract infection. History of hypertension, diabetes.  History of kidney failure, CHF.  PORTABLE CHEST - 1 VIEW  Comparison: 07/31/2011 and earlier  Findings: Heart is enlarged.  No focal consolidations or pleural effusions.  No edema. Visualized osseous structures have a normal appearance.  IMPRESSION: Cardiomegaly.  Original Report Authenticated By: Patterson Hammersmith, M.D.     1. Chest pain      Date: 08/19/2011  Rate: 71  Rhythm: normal sinus rhythm  QRS Axis: normal  Intervals: normal  ST/T Wave abnormalities: normal  Conduction Disutrbances:none  Narrative Interpretation:   Old EKG Reviewed: none available    MDM  Chest pain,  Resolved at discharge pt wanted to go home and follow up with her md        Benny Lennert, MD 08/19/11 [phone]

## 2011-08-19 NOTE — Discharge Instructions (Signed)
Follow up with your md next week. °

## 2011-09-07 ENCOUNTER — Emergency Department (HOSPITAL_COMMUNITY)
Admission: EM | Admit: 2011-09-07 | Discharge: 2011-09-07 | Discharge status: Against Medical Advice | Payer: Medicare Other | Attending: Emergency Medicine | Admitting: Emergency Medicine

## 2011-09-07 ENCOUNTER — Encounter (HOSPITAL_COMMUNITY): Payer: Self-pay | Admitting: *Deleted

## 2011-09-07 DIAGNOSIS — R569 Unspecified convulsions: Secondary | ICD-10-CM | POA: Insufficient documentation

## 2011-09-07 DIAGNOSIS — M25569 Pain in unspecified knee: Secondary | ICD-10-CM | POA: Insufficient documentation

## 2011-09-07 NOTE — ED Notes (Signed)
Pt came into department stating she was having a seizure, pt was batting her eyes and talking in complete sentences and following commands but kept saying she was having a seizure. Pt is in no distress. Pt states her kidneys are shooting down. States she is having right knee pain.

## 2011-09-07 NOTE — ED Notes (Signed)
Pt signed ama form and stated it was to long of a wait. RN notified of same.

## 2011-09-12 ENCOUNTER — Emergency Department (HOSPITAL_COMMUNITY)
Admission: EM | Admit: 2011-09-12 | Discharge: 2011-09-12 | Disposition: A | Discharge status: Home / Self Care | Payer: Medicare Other | Attending: Emergency Medicine | Admitting: Emergency Medicine

## 2011-09-12 ENCOUNTER — Emergency Department (HOSPITAL_COMMUNITY): Payer: Medicare Other

## 2011-09-12 ENCOUNTER — Encounter (HOSPITAL_COMMUNITY): Payer: Self-pay

## 2011-09-12 ENCOUNTER — Other Ambulatory Visit: Payer: Self-pay

## 2011-09-12 DIAGNOSIS — F411 Generalized anxiety disorder: Secondary | ICD-10-CM | POA: Insufficient documentation

## 2011-09-12 DIAGNOSIS — R0789 Other chest pain: Secondary | ICD-10-CM | POA: Insufficient documentation

## 2011-09-12 DIAGNOSIS — R05 Cough: Secondary | ICD-10-CM | POA: Insufficient documentation

## 2011-09-12 DIAGNOSIS — F22 Delusional disorders: Secondary | ICD-10-CM | POA: Insufficient documentation

## 2011-09-12 DIAGNOSIS — R609 Edema, unspecified: Secondary | ICD-10-CM | POA: Insufficient documentation

## 2011-09-12 DIAGNOSIS — I252 Old myocardial infarction: Secondary | ICD-10-CM | POA: Insufficient documentation

## 2011-09-12 DIAGNOSIS — R079 Chest pain, unspecified: Secondary | ICD-10-CM | POA: Insufficient documentation

## 2011-09-12 DIAGNOSIS — J209 Acute bronchitis, unspecified: Secondary | ICD-10-CM | POA: Insufficient documentation

## 2011-09-12 DIAGNOSIS — E119 Type 2 diabetes mellitus without complications: Secondary | ICD-10-CM | POA: Insufficient documentation

## 2011-09-12 DIAGNOSIS — Z79899 Other long term (current) drug therapy: Secondary | ICD-10-CM | POA: Insufficient documentation

## 2011-09-12 DIAGNOSIS — I10 Essential (primary) hypertension: Secondary | ICD-10-CM | POA: Insufficient documentation

## 2011-09-12 DIAGNOSIS — R059 Cough, unspecified: Secondary | ICD-10-CM | POA: Insufficient documentation

## 2011-09-12 DIAGNOSIS — R0602 Shortness of breath: Secondary | ICD-10-CM | POA: Insufficient documentation

## 2011-09-12 HISTORY — DX: Heart failure, unspecified: I50.9

## 2011-09-12 LAB — DIFFERENTIAL
Basophils Absolute: 0 10*3/uL (ref 0.0–0.1)
Basophils Relative: 0 % (ref 0–1)
Lymphocytes Relative: 42 % (ref 12–46)
Monocytes Relative: 14 % — ABNORMAL HIGH (ref 3–12)
Neutro Abs: 2.7 10*3/uL (ref 1.7–7.7)
Neutrophils Relative %: 43 % (ref 43–77)

## 2011-09-12 LAB — COMPREHENSIVE METABOLIC PANEL
AST: 13 U/L (ref 0–37)
Albumin: 3.1 g/dL — ABNORMAL LOW (ref 3.5–5.2)
Alkaline Phosphatase: 51 U/L (ref 39–117)
BUN: 14 mg/dL (ref 6–23)
CO2: 30 mEq/L (ref 19–32)
Chloride: 100 mEq/L (ref 96–112)
Potassium: 3.6 mEq/L (ref 3.5–5.1)
Total Bilirubin: 0.2 mg/dL — ABNORMAL LOW (ref 0.3–1.2)

## 2011-09-12 LAB — CBC
Hemoglobin: 13 g/dL (ref 12.0–15.0)
MCHC: 33.2 g/dL (ref 30.0–36.0)
WBC: 6.2 10*3/uL (ref 4.0–10.5)

## 2011-09-12 IMAGING — CR DG CHEST 1V
1 series · 1 of 1 positions shown · non-contrast
Comparison: Chest radiograph performed [DATE] to S11

CLINICAL DATA: Left-sided chest pain, radiating to the left
shoulder and abdomen.

CHEST - 1 VIEW

[w chest pa]
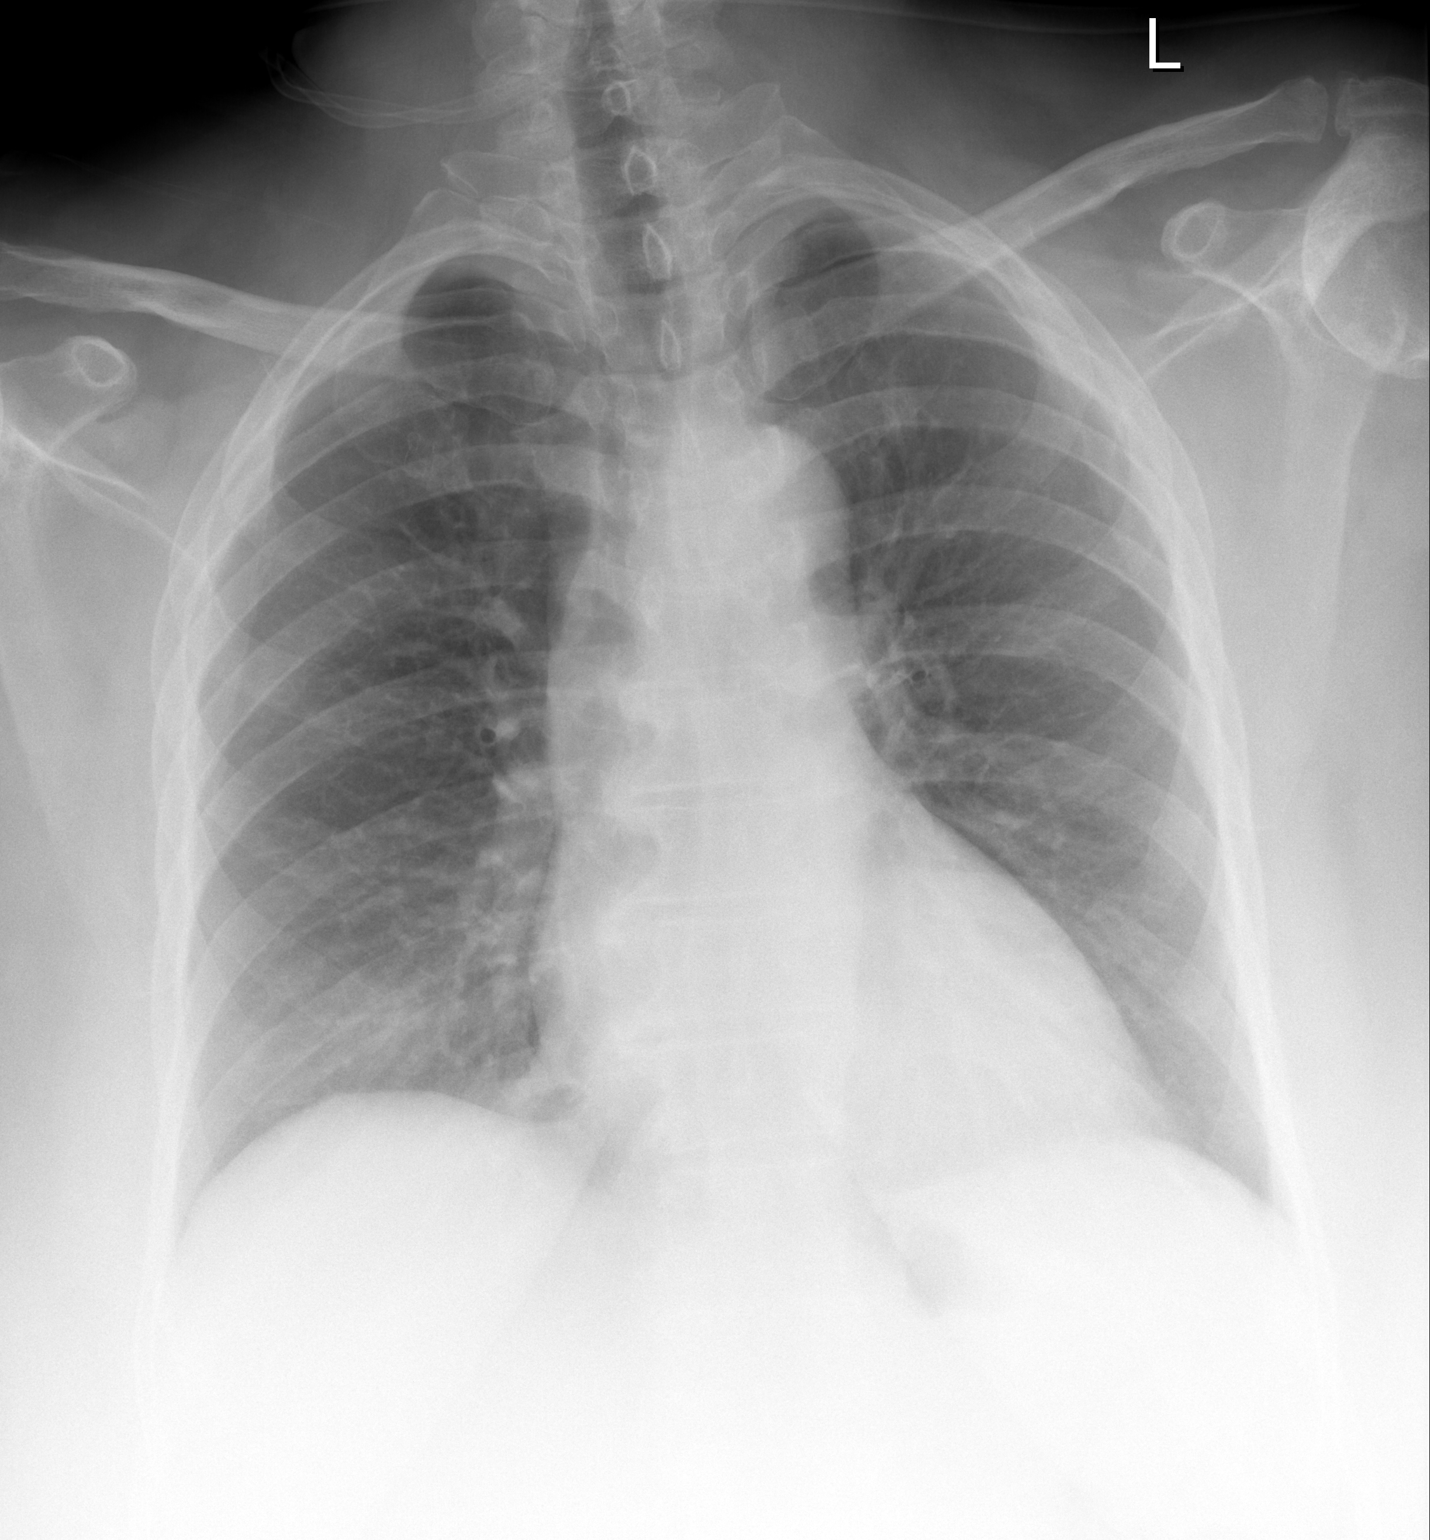

[1 of 1 positions shown; findings below may reference images not displayed]

FINDINGS: The lungs are well-aerated and clear.  There is no
evidence of focal opacification, pleural effusion or pneumothorax.

The cardiomediastinal silhouette is within normal limits.  No acute
osseous abnormalities are seen.
IMPRESSION: No acute cardiopulmonary process seen.

## 2011-09-12 MED ORDER — AZITHROMYCIN 250 MG PO TABS: 250.0000 mg | ORAL_TABLET | Freq: Every day | ORAL | Status: DC

## 2011-09-12 MED ORDER — KETOROLAC TROMETHAMINE 60 MG/2ML IM SOLN: 60.0000 mg | Freq: Once | INTRAMUSCULAR | Status: DC

## 2011-09-12 NOTE — ED Provider Notes (Signed)
History     CSN: [id number]  Arrival date & time 09/12/11  4098   First MD Initiated Contact with Patient 09/12/11 1919      Chief Complaint  Patient presents with  . Chest Pain    (Consider location/radiation/quality/duration/timing/severity/associated sxs/prior treatment) HPI Comments: Patient with history of Schizoaffective disorder.  She presents with pain in the chest, cough, shortness of breath.  She tells me she is pregnant, however her acquaintance says that she is not, and that this is a delusion.    Patient is a 61 y.o. female presenting with chest pain. The history is provided by the patient.  Chest Pain The chest pain began 2 days ago. Chest pain occurs constantly. The chest pain is worsening. The pain is associated with coughing. The severity of the pain is moderate. The quality of the pain is described as pressure-like. The pain does not radiate. Chest pain is worsened by deep breathing. Primary symptoms include cough. Pertinent negatives for primary symptoms include no fever.  Pertinent negatives for associated symptoms include no lower extremity edema. She tried nothing for the symptoms. Risk factors include no known risk factors.     Past Medical History  Diagnosis Date  . Schizoaffective disorder   . Urinary tract infection   . Hypertension   . Atrial fibrillation   . GERD (gastroesophageal reflux disease)   . Hypothyroidism   . Myocardial infarction   . Diabetes mellitus   . Obesity   . Asthma   . COPD (chronic obstructive pulmonary disease)   . Pneumonia   . CHF (congestive heart failure)     Past Surgical History  Procedure Date  . Tubal ligation   . Abdominal hysterectomy     No family history on file.  History  Substance Use Topics  . Smoking status: Former Smoker -- 0.0 packs/day  . Smokeless tobacco: Current User    Types: Snuff, Chew  . Alcohol Use: No    OB History    Grav Para Term Preterm Abortions TAB SAB Ect Mult Living            Review of Systems  Constitutional: Negative for fever.  Respiratory: Positive for cough.   Cardiovascular: Positive for chest pain.  All other systems reviewed and are negative.    Allergies  Sulfamethoxazole w/trimethoprim and Penicillins  Home Medications   Current Outpatient Rx  Name Route Sig Dispense Refill  . ALBUTEROL SULFATE HFA 108 (90 BASE) MCG/ACT IN AERS Inhalation Inhale 2 puffs into the lungs every 6 (six) hours as needed. For wheezing    . ASPIRIN EC 81 MG PO TBEC Oral Take 81 mg by mouth every morning.     Marland Kitchen CHLORPROMAZINE HCL 25 MG PO TABS Oral Take 25-50 mg by mouth 2 (two) times daily. Take 1 tablet in the morning and 2 tablets at bedtime    . FOLIC ACID 1 MG PO TABS Oral Take 1 mg by mouth every morning.     Marland Kitchen METOPROLOL TARTRATE 50 MG PO TABS Oral Take 50 mg by mouth 2 (two) times daily.    Marland Kitchen OMEPRAZOLE 20 MG PO CPDR Oral Take 20 mg by mouth daily.       BP 116/85  Pulse 86  Temp 98.2 F (36.8 C)  Resp 18  Ht 5\' 2"  (1.575 m)  Wt 309 lb (140.161 kg)  BMI 56.52 kg/m2  SpO2 99%  Physical Exam  Nursing note and vitals reviewed. Constitutional: She is oriented to person, place,  and time. She appears well-developed and well-nourished. No distress.  HENT:  Head: Normocephalic and atraumatic.  Neck: Normal range of motion. Neck supple.  Cardiovascular: Normal rate and regular rhythm.  Exam reveals no gallop and no friction rub.   No murmur heard. Pulmonary/Chest: Effort normal and breath sounds normal. No respiratory distress. She has no wheezes.  Abdominal: Soft. Bowel sounds are normal. She exhibits no distension. There is no tenderness.  Musculoskeletal: Normal range of motion. She exhibits edema.  Neurological: She is alert and oriented to person, place, and time.  Skin: Skin is warm and dry. She is not diaphoretic.  Psychiatric: Her mood appears anxious. Her speech is rapid and/or pressured. Thought content is delusional. Cognition and  memory are impaired.    ED Course  Procedures (including critical care time)   Labs Reviewed  CBC  DIFFERENTIAL  COMPREHENSIVE METABOLIC PANEL  TROPONIN I   No results found.   No diagnosis found.   Date: 09/12/2011  Rate: 66  Rhythm: normal sinus rhythm  QRS Axis: normal  Intervals: normal  ST/T Wave abnormalities: normal  Conduction Disutrbances:none  Narrative Interpretation:   Old EKG Reviewed: none available    MDM  The cardiac workup is unremarkable and chest xray looks okay.  She is coughing, and believes she has pneumonia.  Will treat with antibiotics, follow up prn.        Geoffery Lyons, MD 09/12/11 2120

## 2011-09-12 NOTE — ED Notes (Signed)
Pt was seen and examined by Dr. Judd Lien.

## 2011-09-12 NOTE — ED Notes (Signed)
Pt was brought in by ambulance with c/o chest  Pain with shortness of breath onset x a couple of days. Pt claimed that she has had a fever with cough. Pt also claimed that the pain is worse when she coughs.

## 2011-09-12 NOTE — ED Notes (Signed)
After receiving discharge instructions.  Pt stood up, urinated on floor then put clothes on, walked out in hall and requested wheelchair.  Pt discharged via wheelchair.

## 2011-09-12 NOTE — ED Notes (Signed)
Pt st's when she is discharged she is going to get a double burger and fried onion rings.  Advised pt that this will not be good for her pt's laughed.

## 2011-09-12 NOTE — Discharge Instructions (Signed)
Chest Pain (Nonspecific) It is often hard to give a specific diagnosis for the cause of chest pain. There is always a chance that your pain could be related to something serious, such as a heart attack or a blood clot in the lungs. You need to follow up with your caregiver for further evaluation. CAUSES   Heartburn.   Pneumonia or bronchitis.   Anxiety or stress.   Inflammation around your heart (pericarditis) or lung (pleuritis or pleurisy).   A blood clot in the lung.   A collapsed lung (pneumothorax). It can develop suddenly on its own (spontaneous pneumothorax) or from injury (trauma) to the chest.   Shingles infection (herpes zoster virus).  The chest wall is composed of bones, muscles, and cartilage. Any of these can be the source of the pain.  The bones can be bruised by injury.   The muscles or cartilage can be strained by coughing or overwork.   The cartilage can be affected by inflammation and become sore (costochondritis).  DIAGNOSIS  Lab tests or other studies, such as X-rays, electrocardiography, stress testing, or cardiac imaging, may be needed to find the cause of your pain.  TREATMENT   Treatment depends on what may be causing your chest pain. Treatment may include:   Acid blockers for heartburn.   Anti-inflammatory medicine.   Pain medicine for inflammatory conditions.   Antibiotics if an infection is present.   You may be advised to change lifestyle habits. This includes stopping smoking and avoiding alcohol, caffeine, and chocolate.   You may be advised to keep your head raised (elevated) when sleeping. This reduces the chance of acid going backward from your stomach into your esophagus.   Most of the time, nonspecific chest pain will improve within 2 to 3 days with rest and mild pain medicine.  HOME CARE INSTRUCTIONS   If antibiotics were prescribed, take your antibiotics as directed. Finish them even if you start to feel better.   For the next few  days, avoid physical activities that bring on chest pain. Continue physical activities as directed.   Do not smoke.   Avoid drinking alcohol.   Only take over-the-counter or prescription medicine for pain, discomfort, or fever as directed by your caregiver.   Follow your caregiver's suggestions for further testing if your chest pain does not go away.   Keep any follow-up appointments you made. If you do not go to an appointment, you could develop lasting (chronic) problems with pain. If there is any problem keeping an appointment, you must call to reschedule.  SEEK MEDICAL CARE IF:   You think you are having problems from the medicine you are taking. Read your medicine instructions carefully.   Your chest pain does not go away, even after treatment.   You develop a rash with blisters on your chest.  SEEK IMMEDIATE MEDICAL CARE IF:   You have increased chest pain or pain that spreads to your arm, neck, jaw, back, or abdomen.   You develop shortness of breath, an increasing cough, or you are coughing up blood.   You have severe back or abdominal pain, feel nauseous, or vomit.   You develop severe weakness, fainting, or chills.   You have a fever.  THIS IS AN EMERGENCY. Do not wait to see if the pain will go away. Get medical help at once. Call your local emergency services (911 in [place] Do not drive yourself to the hospital. MAKE SURE YOU:   Understand these instructions.     Will watch your condition.   Will get help right away if you are not doing well or get worse.  Document Released: 03/16/2005 Document Revised: 05/26/2011 Document Reviewed: 01/10/2008 [employer] Patient Information 2012 [employer]. 

## 2011-09-12 NOTE — ED Notes (Signed)
Pt attached to cardiac monitor. Pt is A/A/Ox4, skin is warm and dry, respiration is even and unlabored. Pt also claimed that she is pregnant but does not know far along she is

## 2011-09-15 ENCOUNTER — Emergency Department (HOSPITAL_COMMUNITY)
Admission: EM | Admit: 2011-09-15 | Discharge: 2011-09-15 | Disposition: A | Discharge status: Home / Self Care | Payer: Medicare Other | Attending: Emergency Medicine | Admitting: Emergency Medicine

## 2011-09-15 ENCOUNTER — Emergency Department (HOSPITAL_COMMUNITY): Payer: Medicare Other

## 2011-09-15 ENCOUNTER — Other Ambulatory Visit: Payer: Self-pay

## 2011-09-15 ENCOUNTER — Encounter (HOSPITAL_COMMUNITY): Payer: Self-pay | Admitting: Emergency Medicine

## 2011-09-15 DIAGNOSIS — J4489 Other specified chronic obstructive pulmonary disease: Secondary | ICD-10-CM | POA: Insufficient documentation

## 2011-09-15 DIAGNOSIS — R9431 Abnormal electrocardiogram [ECG] [EKG]: Secondary | ICD-10-CM | POA: Insufficient documentation

## 2011-09-15 DIAGNOSIS — J449 Chronic obstructive pulmonary disease, unspecified: Secondary | ICD-10-CM | POA: Insufficient documentation

## 2011-09-15 DIAGNOSIS — R05 Cough: Secondary | ICD-10-CM | POA: Insufficient documentation

## 2011-09-15 DIAGNOSIS — K219 Gastro-esophageal reflux disease without esophagitis: Secondary | ICD-10-CM | POA: Insufficient documentation

## 2011-09-15 DIAGNOSIS — R072 Precordial pain: Secondary | ICD-10-CM | POA: Insufficient documentation

## 2011-09-15 DIAGNOSIS — F259 Schizoaffective disorder, unspecified: Secondary | ICD-10-CM | POA: Insufficient documentation

## 2011-09-15 DIAGNOSIS — I252 Old myocardial infarction: Secondary | ICD-10-CM | POA: Insufficient documentation

## 2011-09-15 DIAGNOSIS — I10 Essential (primary) hypertension: Secondary | ICD-10-CM | POA: Insufficient documentation

## 2011-09-15 DIAGNOSIS — I4891 Unspecified atrial fibrillation: Secondary | ICD-10-CM | POA: Insufficient documentation

## 2011-09-15 DIAGNOSIS — M79609 Pain in unspecified limb: Secondary | ICD-10-CM | POA: Insufficient documentation

## 2011-09-15 DIAGNOSIS — R509 Fever, unspecified: Secondary | ICD-10-CM | POA: Insufficient documentation

## 2011-09-15 DIAGNOSIS — R0602 Shortness of breath: Secondary | ICD-10-CM | POA: Insufficient documentation

## 2011-09-15 DIAGNOSIS — I509 Heart failure, unspecified: Secondary | ICD-10-CM | POA: Insufficient documentation

## 2011-09-15 DIAGNOSIS — R059 Cough, unspecified: Secondary | ICD-10-CM | POA: Insufficient documentation

## 2011-09-15 DIAGNOSIS — E039 Hypothyroidism, unspecified: Secondary | ICD-10-CM | POA: Insufficient documentation

## 2011-09-15 DIAGNOSIS — E119 Type 2 diabetes mellitus without complications: Secondary | ICD-10-CM | POA: Insufficient documentation

## 2011-09-15 LAB — POCT I-STAT, CHEM 8
BUN: 16 mg/dL (ref 6–23)
Calcium, Ion: 1.32 mmol/L (ref 1.12–1.32)
Chloride: 100 mEq/L (ref 96–112)
Glucose, Bld: 99 mg/dL (ref 70–99)

## 2011-09-15 NOTE — Discharge Instructions (Signed)
Follow up with your doctor as soon as possible.  You should return to the ER if there is a change in your pain or worsening of your symptoms. Chest Pain (Nonspecific) It is often hard to give a specific diagnosis for the cause of chest pain. There is always a chance that your pain could be related to something serious, such as a heart attack or a blood clot in the lungs. You need to follow up with your caregiver for further evaluation. CAUSES   Heartburn.   Pneumonia or bronchitis.   Anxiety or stress.   Inflammation around your heart (pericarditis) or lung (pleuritis or pleurisy).   A blood clot in the lung.   A collapsed lung (pneumothorax). It can develop suddenly on its own (spontaneous pneumothorax) or from injury (trauma) to the chest.   Shingles infection (herpes zoster virus).  The chest wall is composed of bones, muscles, and cartilage. Any of these can be the source of the pain.  The bones can be bruised by injury.   The muscles or cartilage can be strained by coughing or overwork.   The cartilage can be affected by inflammation and become sore (costochondritis).  DIAGNOSIS  Lab tests or other studies, such as X-rays, electrocardiography, stress testing, or cardiac imaging, may be needed to find the cause of your pain.  TREATMENT   Treatment depends on what may be causing your chest pain. Treatment may include:   Acid blockers for heartburn.   Anti-inflammatory medicine.   Pain medicine for inflammatory conditions.   Antibiotics if an infection is present.   You may be advised to change lifestyle habits. This includes stopping smoking and avoiding alcohol, caffeine, and chocolate.   You may be advised to keep your head raised (elevated) when sleeping. This reduces the chance of acid going backward from your stomach into your esophagus.   Most of the time, nonspecific chest pain will improve within 2 to 3 days with rest and mild pain medicine.  HOME CARE  INSTRUCTIONS   If antibiotics were prescribed, take your antibiotics as directed. Finish them even if you start to feel better.   For the next few days, avoid physical activities that bring on chest pain. Continue physical activities as directed.   Do not smoke.   Avoid drinking alcohol.   Only take over-the-counter or prescription medicine for pain, discomfort, or fever as directed by your caregiver.   Follow your caregiver's suggestions for further testing if your chest pain does not go away.   Keep any follow-up appointments you made. If you do not go to an appointment, you could develop lasting (chronic) problems with pain. If there is any problem keeping an appointment, you must call to reschedule.  SEEK MEDICAL CARE IF:   You think you are having problems from the medicine you are taking. Read your medicine instructions carefully.   Your chest pain does not go away, even after treatment.   You develop a rash with blisters on your chest.  SEEK IMMEDIATE MEDICAL CARE IF:   You have increased chest pain or pain that spreads to your arm, neck, jaw, back, or abdomen.   You develop shortness of breath, an increasing cough, or you are coughing up blood.   You have severe back or abdominal pain, feel nauseous, or vomit.   You develop severe weakness, fainting, or chills.   You have a fever.  THIS IS AN EMERGENCY. Do not wait to see if the pain will go away.  Get medical help at once. Call your local emergency services (911 in [place] Do not drive yourself to the hospital. MAKE SURE YOU:   Understand these instructions.   Will watch your condition.   Will get help right away if you are not doing well or get worse.  Document Released: 03/16/2005 Document Revised: 05/26/2011 Document Reviewed: 01/10/2008 [employer] Patient Information 2012 [place], [place].

## 2011-09-15 NOTE — ED Provider Notes (Signed)
Date: 09/15/2011  Rate: 84  Rhythm: normal sinus rhythm  QRS Axis: normal  Intervals: normal QRS:  Poor R wave progression in the precordial leads suggests possible old anterior myocardial infarction.  ST/T Wave abnormalities: nonspecific T wave changes  Conduction Disutrbances:none  Narrative Interpretation: Abnormal EKG.  Old EKG Reviewed: Poor quality of prior EKG precludes meaningful comparison.      Carleene Cooper III, MD 09/15/11 2155

## 2011-09-15 NOTE — ED Notes (Signed)
New and old ecg given to edp Ignacia Palma

## 2011-09-15 NOTE — ED Provider Notes (Signed)
History     CSN: [id number]  Arrival date & time 09/15/11  2029   First MD Initiated Contact with Patient 09/15/11 2032      Chief Complaint  Patient presents with  . Chest Pain    (Consider location/radiation/quality/duration/timing/severity/associated sxs/prior treatment) HPI History provided by pt and prior chart.  Pt has h/o schizoaffective disorder.  Presents to ED today w/ c/o intermittent, non-radiating, substernal CP since 5pm today.  It feels like someone is standing on her chest with boots on and it is the worst pain she has ever experienced.  C/o pain in bilateral arms as well that occurs only when she is having the CP.  Pain aggravated by movement and deep inspiration.  Associated w/ SOB, cough and fever w/ max temp of 101.  Per prior chart, pt admitted to hospital for CAP in February, was discharged home on Levaquin and was then readmitted the next day for persistent sx.  Has been to the ED twice since then w/ c/o CP, most recently 3 days ago.  Pain was thought to be atypical and cardiac work-up negative.  Pt d/c'd home on zithromax which she has been compliant with.    Past Medical History  Diagnosis Date  . Schizoaffective disorder   . Urinary tract infection   . Hypertension   . Atrial fibrillation   . GERD (gastroesophageal reflux disease)   . Hypothyroidism   . Myocardial infarction   . Diabetes mellitus   . Obesity   . Asthma   . COPD (chronic obstructive pulmonary disease)   . Pneumonia   . CHF (congestive heart failure)     Past Surgical History  Procedure Date  . Tubal ligation   . Abdominal hysterectomy     No family history on file.  History  Substance Use Topics  . Smoking status: Former Smoker -- 0.0 packs/day  . Smokeless tobacco: Current User    Types: Snuff, Chew  . Alcohol Use: No    OB History    Grav Para Term Preterm Abortions TAB SAB Ect Mult Living                  Review of Systems  All other systems reviewed and are  negative.    Allergies  Sulfamethoxazole w/trimethoprim and Penicillins  Home Medications   Current Outpatient Rx  Name Route Sig Dispense Refill  . ALBUTEROL SULFATE HFA 108 (90 BASE) MCG/ACT IN AERS Inhalation Inhale 2 puffs into the lungs every 6 (six) hours as needed. For wheezing    . AMLODIPINE BESYLATE 5 MG PO TABS Oral Take 5 mg by mouth daily.    . ASPIRIN EC 81 MG PO TBEC Oral Take 81 mg by mouth every morning.     . AZITHROMYCIN 250 MG PO TABS Oral Take 250-500 mg by mouth daily. Take first 2 tablets together, then 1 every day until finished.    . CHLORPROMAZINE HCL 25 MG PO TABS Oral Take 25-50 mg by mouth 2 (two) times daily. Take 1 tablet in the morning and 2 tablets at bedtime    . DIVALPROEX SODIUM ER 500 MG PO TB24 Oral Take 1,000 mg by mouth 2 (two) times daily.    Marland Kitchen FLUTICASONE PROPIONATE 50 MCG/ACT NA SUSP Nasal Place 2 sprays into the nose daily as needed. For nasal congestion    . FOLIC ACID 1 MG PO TABS Oral Take 1 mg by mouth every morning.     Marland Kitchen LEVOTHYROXINE SODIUM 150 MCG PO  TABS Oral Take 150 mcg by mouth daily.    Marland Kitchen METOPROLOL TARTRATE 50 MG PO TABS Oral Take 50 mg by mouth 2 (two) times daily.    Marland Kitchen OMEPRAZOLE 20 MG PO CPDR Oral Take 20 mg by mouth daily.     Marland Kitchen PALIPERIDONE ER 6 MG PO TB24 Oral Take 6 mg by mouth 2 (two) times daily.      BP 123/83  Pulse 88  Temp(Src) 97.6 F (36.4 C) (Oral)  Resp 18  SpO2 96%  Physical Exam  Nursing note and vitals reviewed. Constitutional: She is oriented to person, place, and time. She appears well-developed and well-nourished. No distress.  HENT:  Head: Normocephalic and atraumatic.  Eyes:       Normal appearance  Neck: Normal range of motion.  Cardiovascular: Normal rate, regular rhythm and intact distal pulses.   Pulmonary/Chest: Effort normal and breath sounds normal. No respiratory distress. She exhibits tenderness.       Tenderness of sternum  Abdominal: Soft. Bowel sounds are normal. She exhibits no  distension. There is no tenderness. There is no guarding.  Musculoskeletal:       No peripheral edema or calf tenderness  Neurological: She is alert and oriented to person, place, and time.  Skin: Skin is warm and dry. No rash noted.  Psychiatric: She has a normal mood and affect. Her behavior is normal.    ED Course  Procedures (including critical care time)   Labs Reviewed  POCT I-STAT, CHEM 8  POCT I-STAT TROPONIN I   Dg Chest 2 View  09/15/2011  *RADIOLOGY REPORT*  Clinical Data: Chest pain with hypertension, diabetes, and COPD.  CHEST - 2 VIEW  Comparison: 09/12/2011  Findings: Cardiomegaly.  No focal consolidation, pleural effusion, or pneumothorax.  Increased body habitus.  Unremarkable osseous structures.  Little change from priors.  IMPRESSION: Cardiomegaly.  No acute cardiopulmonary disease.  Original Report Authenticated By: Elsie Stain, M.D.     1. Chest pain       MDM  [phone] F w/ h/o psychiatric illness presents to ED w/ c/o atypical CP.  Two recent admissions for CAP and has been seen in ED for CP twice since.  D/c'd home w/ Zpak for possible bronchitis 3 days ago.  Pain reproducible w/ palpation on exam.  Afebrile, VS w/in nml range, lungs clear and no LE edema or calf tenderness.  I-Stat chem 8 and CXR unremarkable and troponin neg.  Dr. Ignacia Palma has discussed results w/ patient.  He agrees that patient is safe for discharge.  Recommended close f/u with PCP at [facility] and return precautions discussed.         [name], [place] 09/15/11 [phone]

## 2011-09-15 NOTE — ED Notes (Signed)
PER EMS- Patient c/o of chest pain. Patient states she has a very significant medical history. Patient has screaming on and off periodically. Given 325 of Aspirin, 2 NTG. No change in chest pain. C/O cough as well. Patient has a known psychiatric history.

## 2011-09-16 NOTE — ED Provider Notes (Signed)
Medical screening examination/treatment/procedure(s) were conducted as a shared visit with non-physician practitioner(s) and myself.  I personally evaluated the patient during the encounter 61 yo woman with complaint of chest pain.  Multiple prior evaluations, recent visit 3 days ago with Dx bronchitis, Rx with Z-Pak.  Exam shows late-middle-aged woman, moderately obese, strange affect.  Lungs clear, heart sounds normal, abdomen soft and nontender.  Lab workup negative, EKG non-acute.  Reassured and released.  Advised to followup at [facility]; she says she has an appointment in late April.  Carleene Cooper III, MD 09/16/11 [phone]

## 2011-09-21 ENCOUNTER — Emergency Department (HOSPITAL_COMMUNITY)
Admission: EM | Admit: 2011-09-21 | Discharge: 2011-09-21 | Disposition: A | Discharge status: Home / Self Care | Payer: Medicare Other | Attending: Emergency Medicine | Admitting: Emergency Medicine

## 2011-09-21 ENCOUNTER — Other Ambulatory Visit: Payer: Self-pay

## 2011-09-21 ENCOUNTER — Encounter (HOSPITAL_COMMUNITY): Payer: Self-pay | Admitting: Emergency Medicine

## 2011-09-21 DIAGNOSIS — E669 Obesity, unspecified: Secondary | ICD-10-CM | POA: Insufficient documentation

## 2011-09-21 DIAGNOSIS — J4489 Other specified chronic obstructive pulmonary disease: Secondary | ICD-10-CM | POA: Insufficient documentation

## 2011-09-21 DIAGNOSIS — J449 Chronic obstructive pulmonary disease, unspecified: Secondary | ICD-10-CM | POA: Insufficient documentation

## 2011-09-21 DIAGNOSIS — I4891 Unspecified atrial fibrillation: Secondary | ICD-10-CM | POA: Insufficient documentation

## 2011-09-21 DIAGNOSIS — I10 Essential (primary) hypertension: Secondary | ICD-10-CM | POA: Insufficient documentation

## 2011-09-21 DIAGNOSIS — E039 Hypothyroidism, unspecified: Secondary | ICD-10-CM | POA: Insufficient documentation

## 2011-09-21 DIAGNOSIS — K219 Gastro-esophageal reflux disease without esophagitis: Secondary | ICD-10-CM | POA: Insufficient documentation

## 2011-09-21 DIAGNOSIS — Z711 Person with feared health complaint in whom no diagnosis is made: Secondary | ICD-10-CM | POA: Insufficient documentation

## 2011-09-21 DIAGNOSIS — E1169 Type 2 diabetes mellitus with other specified complication: Secondary | ICD-10-CM | POA: Insufficient documentation

## 2011-09-21 NOTE — ED Provider Notes (Signed)
History     CSN: [id number]  Arrival date & time 09/21/11  2017   First MD Initiated Contact with Patient 09/21/11 2127      Chief Complaint  Patient presents with  . Hyperglycemia    (Consider location/radiation/quality/duration/timing/severity/associated sxs/prior treatment) HPI Comments: Patient states she has a new glucometer at home and it was reading over 700.  She became concerned and called EMS for transport to the emergency room on arrival at the emergency room her blood glucose is 103.  She was reassured at this time.  She is also concerned that she may have an irregular heart rate.  She is followed by Dr. home on the her EKG shows that she has a normal sinus rhythm with a rate of 85  The history is provided by the patient.    Past Medical History  Diagnosis Date  . Schizoaffective disorder   . Urinary tract infection   . Hypertension   . Atrial fibrillation   . GERD (gastroesophageal reflux disease)   . Hypothyroidism   . Myocardial infarction   . Diabetes mellitus   . Obesity   . Asthma   . COPD (chronic obstructive pulmonary disease)   . Pneumonia   . CHF (congestive heart failure)     Past Surgical History  Procedure Date  . Tubal ligation   . Abdominal hysterectomy     No family history on file.  History  Substance Use Topics  . Smoking status: Former Smoker -- 0.0 packs/day  . Smokeless tobacco: Current User    Types: Snuff, Chew  . Alcohol Use: No    OB History    Grav Para Term Preterm Abortions TAB SAB Ect Mult Living                  Review of Systems  Constitutional: Negative for fever and chills.  HENT: Negative for congestion and rhinorrhea.   Respiratory: Negative for cough and shortness of breath.   Genitourinary: Negative for dysuria and frequency.  Musculoskeletal: Negative for back pain.  Neurological: Negative for dizziness and numbness.    Allergies  Sulfamethoxazole w/trimethoprim and Penicillins  Home Medications    Current Outpatient Rx  Name Route Sig Dispense Refill  . ALBUTEROL SULFATE HFA 108 (90 BASE) MCG/ACT IN AERS Inhalation Inhale 2 puffs into the lungs every 6 (six) hours as needed. For wheezing    . AMLODIPINE BESYLATE 5 MG PO TABS Oral Take 5 mg by mouth daily.    . ASPIRIN EC 81 MG PO TBEC Oral Take 81 mg by mouth every morning.     Marland Kitchen CHLORPROMAZINE HCL 25 MG PO TABS Oral Take 25-50 mg by mouth 2 (two) times daily. Take 1 tablet in the morning and 2 tablets at bedtime    . DIVALPROEX SODIUM ER 500 MG PO TB24 Oral Take 1,000 mg by mouth 2 (two) times daily.    Marland Kitchen FLUTICASONE PROPIONATE 50 MCG/ACT NA SUSP Nasal Place 2 sprays into the nose daily as needed. For nasal congestion    . FOLIC ACID 1 MG PO TABS Oral Take 1 mg by mouth every morning.     Marland Kitchen LEVOTHYROXINE SODIUM 150 MCG PO TABS Oral Take 150 mcg by mouth daily.    Marland Kitchen METOPROLOL TARTRATE 50 MG PO TABS Oral Take 50 mg by mouth 2 (two) times daily.    Marland Kitchen OMEPRAZOLE 20 MG PO CPDR Oral Take 20 mg by mouth daily.     Marland Kitchen PALIPERIDONE ER 6 MG  PO TB24 Oral Take 6 mg by mouth 2 (two) times daily.      BP 137/84  Pulse 86  Temp(Src) 98.4 F (36.9 C) (Oral)  Resp 20  SpO2 95%  Physical Exam  Constitutional: She appears well-developed and well-nourished.  HENT:  Head: Normocephalic.  Eyes: Pupils are equal, round, and reactive to light.  Cardiovascular: Normal rate, regular rhythm and normal heart sounds.   Abdominal: Soft. She exhibits no distension. There is no tenderness.  Musculoskeletal: Normal range of motion.  Neurological: She is alert.  Skin: Skin is warm and dry.    ED Course  Procedures (including critical care time)  Labs Reviewed  GLUCOSE, CAPILLARY - Abnormal; Notable for the following:    Glucose-Capillary 114 (*)    All other components within normal limits   No results found.   1. Physically well but worried       MDM  Patient is not having any peripheral edema, shortness of breath, coughing, she is not  having dysuria or frequency, polydipsia, polyuria.  She has had no weight change.  She was evaluated 3 times in March for her cardiac evaluation.  Post seeing Dr. her 1 in the office.  She has an appointment in 3 weeks for followup examination        Arman Filter, NP 09/21/11 2144  Arman Filter, NP 09/21/11 2144

## 2011-09-21 NOTE — ED Notes (Signed)
PT. ARRIVED WITH EMS FROM HOME REPORTS ELEVATED BLOOD SUGAR AT HOME = 700 + , EMS CHECKED CBG= 103 , ALERT AND ORIENTED , RESPIRATIONS UNLABORED. DENIES PAIN OR DISCOMFORT.

## 2011-09-21 NOTE — Discharge Instructions (Signed)
As discussed please take your new glucometer to the pharmacy.  We purchased it and pass them to calibrated as I think this may be off.  Your blood sugar on arrival to the emergency room was 103.  Her EKG is normal with a regular heart beat

## 2011-09-22 NOTE — ED Provider Notes (Signed)
Medical screening examination/treatment/procedure(s) were performed by non-physician practitioner and as supervising physician I was immediately available for consultation/collaboration.  Juliet Rude. Rubin Payor, MD 09/22/11 [phone]

## 2011-09-25 ENCOUNTER — Emergency Department (HOSPITAL_COMMUNITY)
Admission: EM | Admit: 2011-09-25 | Discharge: 2011-09-26 | Disposition: A | Discharge status: Home / Self Care | Payer: Medicare Other | Attending: Emergency Medicine | Admitting: Emergency Medicine

## 2011-09-25 DIAGNOSIS — E039 Hypothyroidism, unspecified: Secondary | ICD-10-CM | POA: Insufficient documentation

## 2011-09-25 DIAGNOSIS — J4489 Other specified chronic obstructive pulmonary disease: Secondary | ICD-10-CM | POA: Insufficient documentation

## 2011-09-25 DIAGNOSIS — Z87891 Personal history of nicotine dependence: Secondary | ICD-10-CM | POA: Insufficient documentation

## 2011-09-25 DIAGNOSIS — Z8701 Personal history of pneumonia (recurrent): Secondary | ICD-10-CM | POA: Insufficient documentation

## 2011-09-25 DIAGNOSIS — R109 Unspecified abdominal pain: Secondary | ICD-10-CM | POA: Insufficient documentation

## 2011-09-25 DIAGNOSIS — F259 Schizoaffective disorder, unspecified: Secondary | ICD-10-CM | POA: Insufficient documentation

## 2011-09-25 DIAGNOSIS — I252 Old myocardial infarction: Secondary | ICD-10-CM | POA: Insufficient documentation

## 2011-09-25 DIAGNOSIS — Z6841 Body Mass Index (BMI) 40.0 and over, adult: Secondary | ICD-10-CM | POA: Insufficient documentation

## 2011-09-25 DIAGNOSIS — J449 Chronic obstructive pulmonary disease, unspecified: Secondary | ICD-10-CM | POA: Insufficient documentation

## 2011-09-25 DIAGNOSIS — I10 Essential (primary) hypertension: Secondary | ICD-10-CM | POA: Insufficient documentation

## 2011-09-25 DIAGNOSIS — N390 Urinary tract infection, site not specified: Secondary | ICD-10-CM | POA: Insufficient documentation

## 2011-09-25 DIAGNOSIS — I4891 Unspecified atrial fibrillation: Secondary | ICD-10-CM | POA: Insufficient documentation

## 2011-09-25 DIAGNOSIS — K219 Gastro-esophageal reflux disease without esophagitis: Secondary | ICD-10-CM | POA: Insufficient documentation

## 2011-09-25 DIAGNOSIS — E119 Type 2 diabetes mellitus without complications: Secondary | ICD-10-CM | POA: Insufficient documentation

## 2011-09-25 DIAGNOSIS — I509 Heart failure, unspecified: Secondary | ICD-10-CM | POA: Insufficient documentation

## 2011-09-25 NOTE — ED Notes (Signed)
RUE:[id number]<JW> Expected date:09/25/11<BR> Expected time:10:39 PM<BR> Means of arrival:Ambulance<BR> Comments:<BR> M261. 61 yo f. Right flank pain, 1 hr onset, stable/ambulatory with walker. 10 mins

## 2011-09-25 NOTE — ED Notes (Signed)
As per EMS pt states "she thinks her kidney collapsed"the patient also states that she is pregnant and is due in august.

## 2011-09-26 LAB — URINE MICROSCOPIC-ADD ON

## 2011-09-26 LAB — URINALYSIS, ROUTINE W REFLEX MICROSCOPIC
Bilirubin Urine: NEGATIVE
Glucose, UA: NEGATIVE mg/dL
Nitrite: POSITIVE — AB
Specific Gravity, Urine: 1.011 (ref 1.005–1.030)
pH: 7 (ref 5.0–8.0)

## 2011-09-26 MED ORDER — NAPROXEN 500 MG PO TABS: 500.0000 mg | ORAL_TABLET | Freq: Two times a day (BID) | ORAL | Status: DC

## 2011-09-26 MED ORDER — CIPROFLOXACIN HCL 500 MG PO TABS: 500.0000 mg | ORAL_TABLET | Freq: Two times a day (BID) | ORAL | Status: AC

## 2011-09-26 MED ORDER — CIPROFLOXACIN HCL 500 MG PO TABS
500.0000 mg | ORAL_TABLET | Freq: Once | ORAL | Status: AC
  Administered 2011-09-26: 500 mg via ORAL
  Filled 2011-09-26: qty 1

## 2011-09-26 MED ORDER — IBUPROFEN 800 MG PO TABS
800.0000 mg | ORAL_TABLET | Freq: Once | ORAL | Status: AC
  Administered 2011-09-26: 800 mg via ORAL
  Filled 2011-09-26: qty 1

## 2011-09-26 NOTE — ED Provider Notes (Signed)
History     CSN: [id number]  Arrival date & time 09/25/11  2239   First MD Initiated Contact with Patient 09/25/11 2356      Chief Complaint  Patient presents with  . Flank Pain    (Consider location/radiation/quality/duration/timing/severity/associated sxs/prior treatment) HPI Comments: The patient is a 61 year old schizoaffective female with history of multiple emergency department visits for multiple different complaints who presents with a complaint of not being able to urinate for approximately 24 hours. She admits to having a small amount of left-sided flank pain, no vomiting fevers chills chest pain shortness of breath swelling diarrhea or dysuria. Symptoms are persistent, gradually getting worse  Patient is a 61 y.o. female presenting with flank pain. The history is provided by the patient and medical records.  Flank Pain Pertinent negatives include no chest pain and no shortness of breath.    Past Medical History  Diagnosis Date  . Schizoaffective disorder   . Urinary tract infection   . Hypertension   . Atrial fibrillation   . GERD (gastroesophageal reflux disease)   . Hypothyroidism   . Myocardial infarction   . Diabetes mellitus   . Obesity   . Asthma   . COPD (chronic obstructive pulmonary disease)   . Pneumonia   . CHF (congestive heart failure)     Past Surgical History  Procedure Date  . Tubal ligation   . Abdominal hysterectomy     No family history on file.  History  Substance Use Topics  . Smoking status: Former Smoker -- 0.0 packs/day  . Smokeless tobacco: Current User    Types: Snuff, Chew  . Alcohol Use: No    OB History    Grav Para Term Preterm Abortions TAB SAB Ect Mult Living                  Review of Systems  Constitutional: Negative for fever and chills.  Respiratory: Negative for cough and shortness of breath.   Cardiovascular: Negative for chest pain and leg swelling.  Genitourinary: Positive for flank pain and decreased  urine volume. Negative for dysuria and hematuria.  Musculoskeletal: Negative for back pain.  Skin: Negative for rash.    Allergies  Sulfamethoxazole w/trimethoprim and Penicillins  Home Medications   Current Outpatient Rx  Name Route Sig Dispense Refill  . ALBUTEROL SULFATE HFA 108 (90 BASE) MCG/ACT IN AERS Inhalation Inhale 2 puffs into the lungs every 6 (six) hours as needed. For wheezing    . AMLODIPINE BESYLATE 5 MG PO TABS Oral Take 5 mg by mouth daily.    . ASPIRIN EC 81 MG PO TBEC Oral Take 81 mg by mouth every morning.     Marland Kitchen CHLORPROMAZINE HCL 25 MG PO TABS Oral Take 25-50 mg by mouth 2 (two) times daily. Take 1 tablet in the morning and 2 tablets at bedtime    . DIVALPROEX SODIUM ER 500 MG PO TB24 Oral Take 1,000 mg by mouth 2 (two) times daily.    Marland Kitchen FLUTICASONE PROPIONATE 50 MCG/ACT NA SUSP Nasal Place 2 sprays into the nose daily as needed. For nasal congestion    . FOLIC ACID 1 MG PO TABS Oral Take 1 mg by mouth every morning.     Marland Kitchen LEVOTHYROXINE SODIUM 150 MCG PO TABS Oral Take 150 mcg by mouth daily.    Marland Kitchen METOPROLOL TARTRATE 50 MG PO TABS Oral Take 50 mg by mouth 2 (two) times daily.    Marland Kitchen OMEPRAZOLE 20 MG PO CPDR Oral  Take 20 mg by mouth daily.     Marland Kitchen PALIPERIDONE ER 6 MG PO TB24 Oral Take 6 mg by mouth 2 (two) times daily.    Marland Kitchen CIPROFLOXACIN HCL 500 MG PO TABS Oral Take 1 tablet (500 mg total) by mouth every 12 (twelve) hours. 10 tablet 0  . NAPROXEN 500 MG PO TABS Oral Take 1 tablet (500 mg total) by mouth 2 (two) times daily with a meal. 30 tablet 0    BP 131/77  Pulse 90  Temp(Src) 98 F (36.7 C) (Oral)  Resp 22  Ht 5\' 2"  (1.575 m)  Wt 329 lb (149.233 kg)  BMI 60.17 kg/m2  SpO2 96%  Physical Exam  Constitutional: She appears well-developed and well-nourished. No distress.  HENT:  Head: Normocephalic and atraumatic.  Mouth/Throat: Oropharynx is clear and moist. No oropharyngeal exudate.  Eyes: Conjunctivae are normal. No scleral icterus.  Cardiovascular:  Normal rate, regular rhythm, normal heart sounds and intact distal pulses.   No murmur heard. Pulmonary/Chest: Effort normal and breath sounds normal. No respiratory distress. She has no wheezes. She has no rales.  Abdominal: Soft. Bowel sounds are normal. She exhibits no distension. There is no tenderness. There is no rebound and no guarding.       Morbidly obese, no abdominal tenderness, no CVA tenderness on the left or right  Genitourinary:       Chaperone present, external vaginal exam shows no discharge redness or rash  Musculoskeletal: Normal range of motion. She exhibits no edema and no tenderness.  Neurological: She is alert. Coordination normal.  Skin: Skin is warm and dry. No rash noted. She is not diaphoretic. No erythema.    ED Course  Procedures (including critical care time)  Labs Reviewed  URINALYSIS, ROUTINE W REFLEX MICROSCOPIC - Abnormal; Notable for the following:    APPearance CLOUDY (*)    Hgb urine dipstick TRACE (*)    Nitrite POSITIVE (*)    Leukocytes, UA MODERATE (*)    All other components within normal limits  URINE MICROSCOPIC-ADD ON - Abnormal; Notable for the following:    Squamous Epithelial / LPF FEW (*)    Bacteria, UA MANY (*)    Crystals CA OXALATE CRYSTALS (*)    All other components within normal limits  URINE CULTURE   No results found.   1. UTI (lower urinary tract infection)       MDM  In and out urinary catheterization, vital signs are normal, patient is in no distress.  Rule out urinary infection, can followup with family doctor - Dr. Sharyn Lull per pt.  Pt informed of infection, cipro given, VS stable.  Pt is well appearing, UCx sent.  Discharge Prescriptions include:  cipro naprosyn  Vida Roller, MD 09/26/11 [phone]

## 2011-09-26 NOTE — Discharge Instructions (Signed)
Return to your family doctor for recheck in 2 days - return to hospital for severe or worsening pain, fever, vomiting.  Ciprofloxacin twice daily for 5 days.

## 2011-09-27 LAB — URINE CULTURE: Colony Count: >=100000

## 2011-09-28 NOTE — ED Notes (Signed)
+   Urine Resistant to Cipro-Chart sent to EDP office for review.

## 2011-10-01 NOTE — ED Notes (Signed)
Pt called back and was given urine culture results and asked to pick up her new prescription at [facility]) for Nitrofuration 100 mg PO BID x 5 days.

## 2011-10-01 NOTE — ED Notes (Signed)
Chart back from EDP office; Rx for Nitrofurantoin 100mg  PO bid x 5 days

## 2011-10-09 ENCOUNTER — Encounter (HOSPITAL_COMMUNITY): Payer: Self-pay | Admitting: Emergency Medicine

## 2011-10-09 ENCOUNTER — Emergency Department (HOSPITAL_COMMUNITY): Payer: Medicare Other

## 2011-10-09 ENCOUNTER — Emergency Department (HOSPITAL_COMMUNITY)
Admission: EM | Admit: 2011-10-09 | Discharge: 2011-10-09 | Disposition: A | Discharge status: Home / Self Care | Payer: Medicare Other | Attending: Emergency Medicine | Admitting: Emergency Medicine

## 2011-10-09 DIAGNOSIS — I4891 Unspecified atrial fibrillation: Secondary | ICD-10-CM | POA: Insufficient documentation

## 2011-10-09 DIAGNOSIS — I252 Old myocardial infarction: Secondary | ICD-10-CM | POA: Insufficient documentation

## 2011-10-09 DIAGNOSIS — F259 Schizoaffective disorder, unspecified: Secondary | ICD-10-CM | POA: Insufficient documentation

## 2011-10-09 DIAGNOSIS — Z87891 Personal history of nicotine dependence: Secondary | ICD-10-CM | POA: Insufficient documentation

## 2011-10-09 DIAGNOSIS — I10 Essential (primary) hypertension: Secondary | ICD-10-CM | POA: Insufficient documentation

## 2011-10-09 DIAGNOSIS — J4489 Other specified chronic obstructive pulmonary disease: Secondary | ICD-10-CM | POA: Insufficient documentation

## 2011-10-09 DIAGNOSIS — R112 Nausea with vomiting, unspecified: Secondary | ICD-10-CM | POA: Insufficient documentation

## 2011-10-09 DIAGNOSIS — R110 Nausea: Secondary | ICD-10-CM

## 2011-10-09 DIAGNOSIS — R071 Chest pain on breathing: Secondary | ICD-10-CM | POA: Insufficient documentation

## 2011-10-09 DIAGNOSIS — Z8701 Personal history of pneumonia (recurrent): Secondary | ICD-10-CM | POA: Insufficient documentation

## 2011-10-09 DIAGNOSIS — E119 Type 2 diabetes mellitus without complications: Secondary | ICD-10-CM | POA: Insufficient documentation

## 2011-10-09 DIAGNOSIS — J449 Chronic obstructive pulmonary disease, unspecified: Secondary | ICD-10-CM | POA: Insufficient documentation

## 2011-10-09 DIAGNOSIS — R0789 Other chest pain: Secondary | ICD-10-CM

## 2011-10-09 DIAGNOSIS — I509 Heart failure, unspecified: Secondary | ICD-10-CM | POA: Insufficient documentation

## 2011-10-09 LAB — COMPREHENSIVE METABOLIC PANEL
ALT: 13 U/L (ref 0–35)
AST: 19 U/L (ref 0–37)
Alkaline Phosphatase: 55 U/L (ref 39–117)
CO2: 34 mEq/L — ABNORMAL HIGH (ref 19–32)
Chloride: 98 mEq/L (ref 96–112)
GFR calc non Af Amer: >90 mL/min (ref >90)
Sodium: 139 mEq/L (ref 135–145)
Total Bilirubin: 0.2 mg/dL — ABNORMAL LOW (ref 0.3–1.2)

## 2011-10-09 LAB — COMPREHENSIVE METABOLIC PANEL WITH GFR
Albumin: 3.7 g/dL (ref 3.5–5.2)
BUN: 15 mg/dL (ref 6–23)
Calcium: 10 mg/dL (ref 8.4–10.5)
Creatinine, Ser: 0.77 mg/dL (ref 0.50–1.10)
GFR calc Af Amer: >90 mL/min (ref >90)
Glucose, Bld: 108 mg/dL — ABNORMAL HIGH (ref 70–99)
Potassium: 4.2 meq/L (ref 3.5–5.1)
Total Protein: 6.9 g/dL (ref 6.0–8.3)

## 2011-10-09 LAB — DIFFERENTIAL
Basophils Absolute: 0 10*3/uL (ref 0.0–0.1)
Basophils Relative: 1 % (ref 0–1)
Eosinophils Absolute: 0 K/uL (ref 0.0–0.7)
Eosinophils Relative: 1 % (ref 0–5)
Lymphocytes Relative: 43 % (ref 12–46)
Lymphs Abs: 2.6 K/uL (ref 0.7–4.0)
Monocytes Absolute: 0.7 10*3/uL (ref 0.1–1.0)
Monocytes Relative: 11 % (ref 3–12)
Neutro Abs: 2.8 10*3/uL (ref 1.7–7.7)
Neutrophils Relative %: 45 % (ref 43–77)

## 2011-10-09 LAB — CBC
HCT: 40.8 % (ref 36.0–46.0)
Hemoglobin: 13.9 g/dL (ref 12.0–15.0)
MCH: 33.7 pg (ref 26.0–34.0)
MCHC: 34.1 g/dL (ref 30.0–36.0)
MCV: 99 fL (ref 78.0–100.0)
Platelets: 188 10*3/uL (ref 150–400)
RBC: 4.12 MIL/uL (ref 3.87–5.11)
RDW: 13.5 % (ref 11.5–15.5)
WBC: 6.1 10*3/uL (ref 4.0–10.5)

## 2011-10-09 LAB — POCT I-STAT TROPONIN I: Troponin i, poc: 0 ng/mL (ref 0.00–0.08)

## 2011-10-09 LAB — LIPASE, BLOOD: Lipase: 42 U/L (ref 11–59)

## 2011-10-09 MED ORDER — FENTANYL CITRATE 0.05 MG/ML IJ SOLN
50.0000 ug | Freq: Once | INTRAMUSCULAR | Status: AC
  Administered 2011-10-09: 50 ug via INTRAVENOUS
  Filled 2011-10-09: qty 2

## 2011-10-09 NOTE — ED Notes (Signed)
Patient is AOx4 and comfortable with her discharge instructions. 
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
## 2011-10-09 NOTE — ED Notes (Signed)
Old and new ECG given to Dr. Oletta Lamas
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
## 2011-10-09 NOTE — Discharge Instructions (Signed)
Chest Pain (Nonspecific) It is often hard to give a specific diagnosis for the cause of chest pain. There is always a chance that your pain could be related to something serious, such as a heart attack or a blood clot in the lungs. You need to follow up with your caregiver for further evaluation. CAUSES   Heartburn.   Pneumonia or bronchitis.   Anxiety or stress.   Inflammation around your heart (pericarditis) or lung (pleuritis or pleurisy).   A blood clot in the lung.   A collapsed lung (pneumothorax). It can develop suddenly on its own (spontaneous pneumothorax) or from injury (trauma) to the chest.   Shingles infection (herpes zoster virus).  The chest wall is composed of bones, muscles, and cartilage. Any of these can be the source of the pain.  The bones can be bruised by injury.   The muscles or cartilage can be strained by coughing or overwork.   The cartilage can be affected by inflammation and become sore (costochondritis).  DIAGNOSIS  Lab tests or other studies, such as X-rays, electrocardiography, stress testing, or cardiac imaging, may be needed to find the cause of your pain.  TREATMENT   Treatment depends on what may be causing your chest pain. Treatment may include:   Acid blockers for heartburn.   Anti-inflammatory medicine.   Pain medicine for inflammatory conditions.   Antibiotics if an infection is present.   You may be advised to change lifestyle habits. This includes stopping smoking and avoiding alcohol, caffeine, and chocolate.   You may be advised to keep your head raised (elevated) when sleeping. This reduces the chance of acid going backward from your stomach into your esophagus.   Most of the time, nonspecific chest pain will improve within 2 to 3 days with rest and mild pain medicine.  HOME CARE INSTRUCTIONS   If antibiotics were prescribed, take your antibiotics as directed. Finish them even if you start to feel better.   For the next few  days, avoid physical activities that bring on chest pain. Continue physical activities as directed.   Do not smoke.   Avoid drinking alcohol.   Only take over-the-counter or prescription medicine for pain, discomfort, or fever as directed by your caregiver.   Follow your caregiver's suggestions for further testing if your chest pain does not go away.   Keep any follow-up appointments you made. If you do not go to an appointment, you could develop lasting (chronic) problems with pain. If there is any problem keeping an appointment, you must call to reschedule.  SEEK MEDICAL CARE IF:   You think you are having problems from the medicine you are taking. Read your medicine instructions carefully.   Your chest pain does not go away, even after treatment.   You develop a rash with blisters on your chest.  SEEK IMMEDIATE MEDICAL CARE IF:   You have increased chest pain or pain that spreads to your arm, neck, jaw, back, or abdomen.   You develop shortness of breath, an increasing cough, or you are coughing up blood.   You have severe back or abdominal pain, feel nauseous, or vomit.   You develop severe weakness, fainting, or chills.   You have a fever.  THIS IS AN EMERGENCY. Do not wait to see if the pain will go away. Get medical help at once. Call your local emergency services (911 in [place] Do not drive yourself to the hospital. MAKE SURE YOU:   Understand these instructions.     Will watch your condition.   Will get help right away if you are not doing well or get worse.  Document Released: 03/16/2005 Document Revised: 05/26/2011 Document Reviewed: 01/10/2008 [employer] Patient Information 2012 [employer]. 

## 2011-10-12 IMAGING — CR DG ABDOMEN ACUTE W/ 1V CHEST
4 series · 4 of 4 positions shown · non-contrast
Comparison: PA and lateral chest 04/26/2009.

CLINICAL DATA: Chest tightness and abdominal pain.

ACUTE ABDOMEN SERIES (ABDOMEN 2 VIEW & CHEST 1 VIEW)

[w abdomen upright]
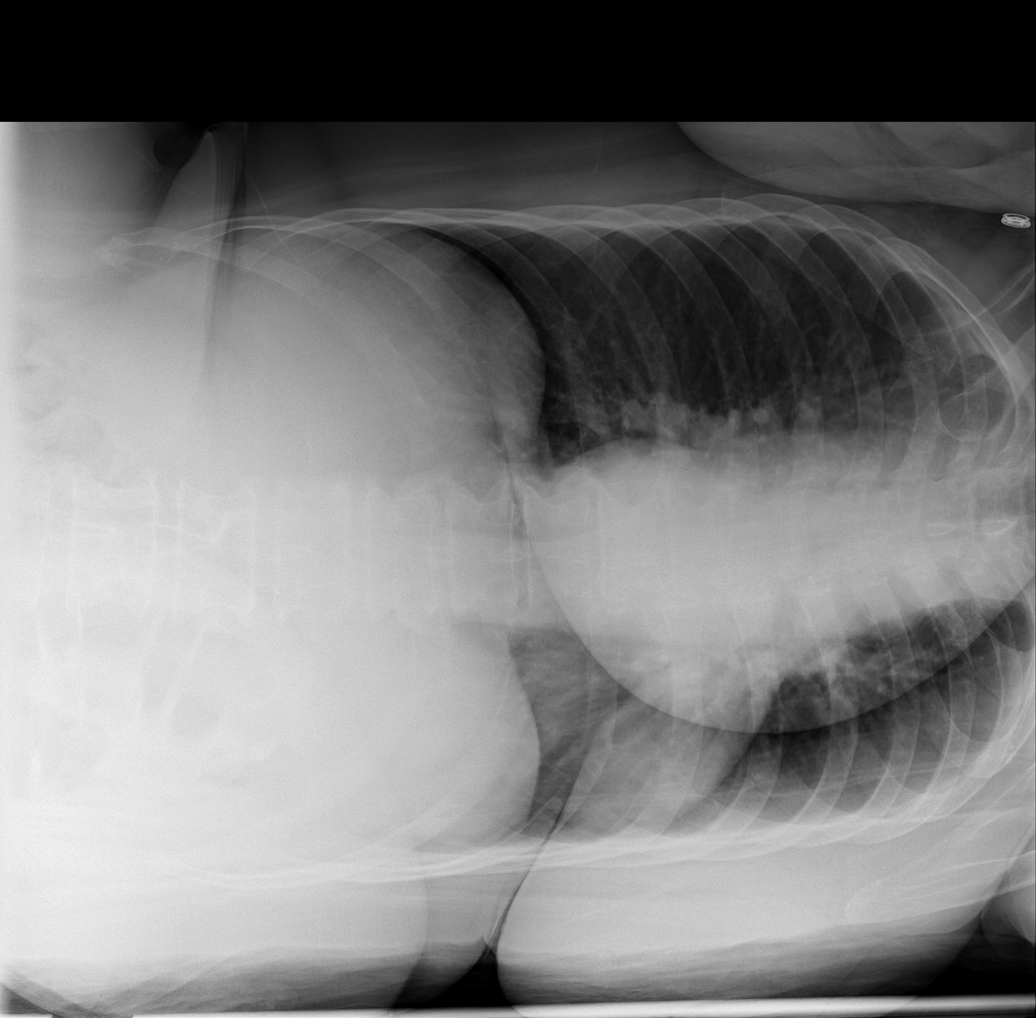

[t abdomen supine (1 of 2)]
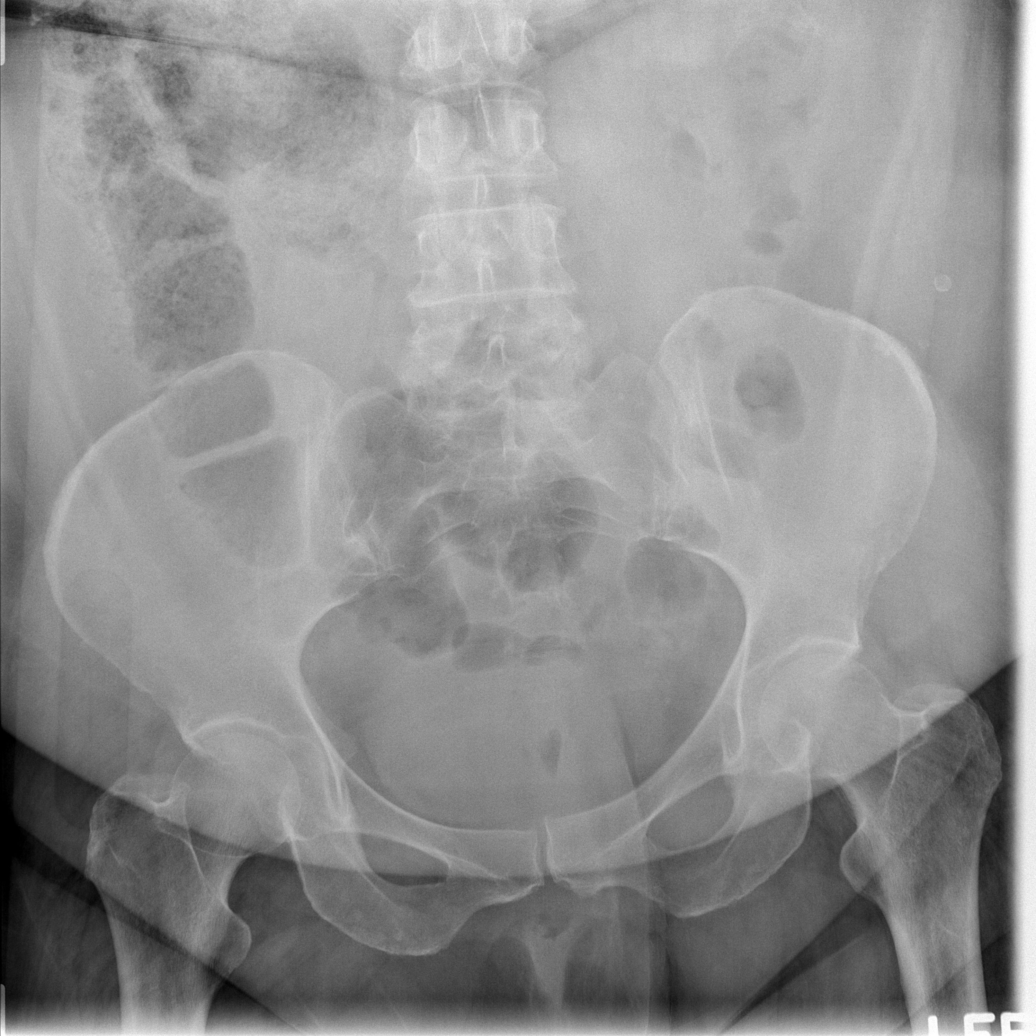

[t abdomen supine (2 of 2)]
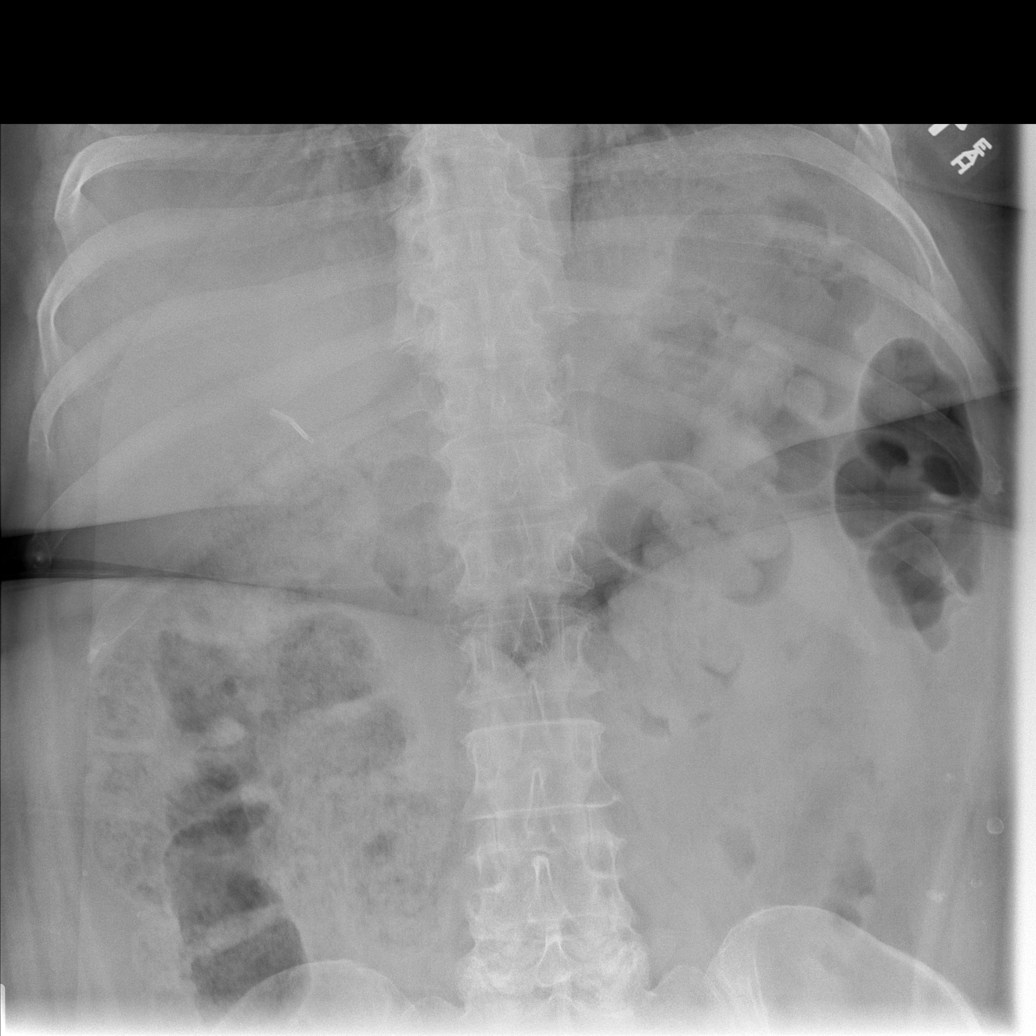

[t chest supine]
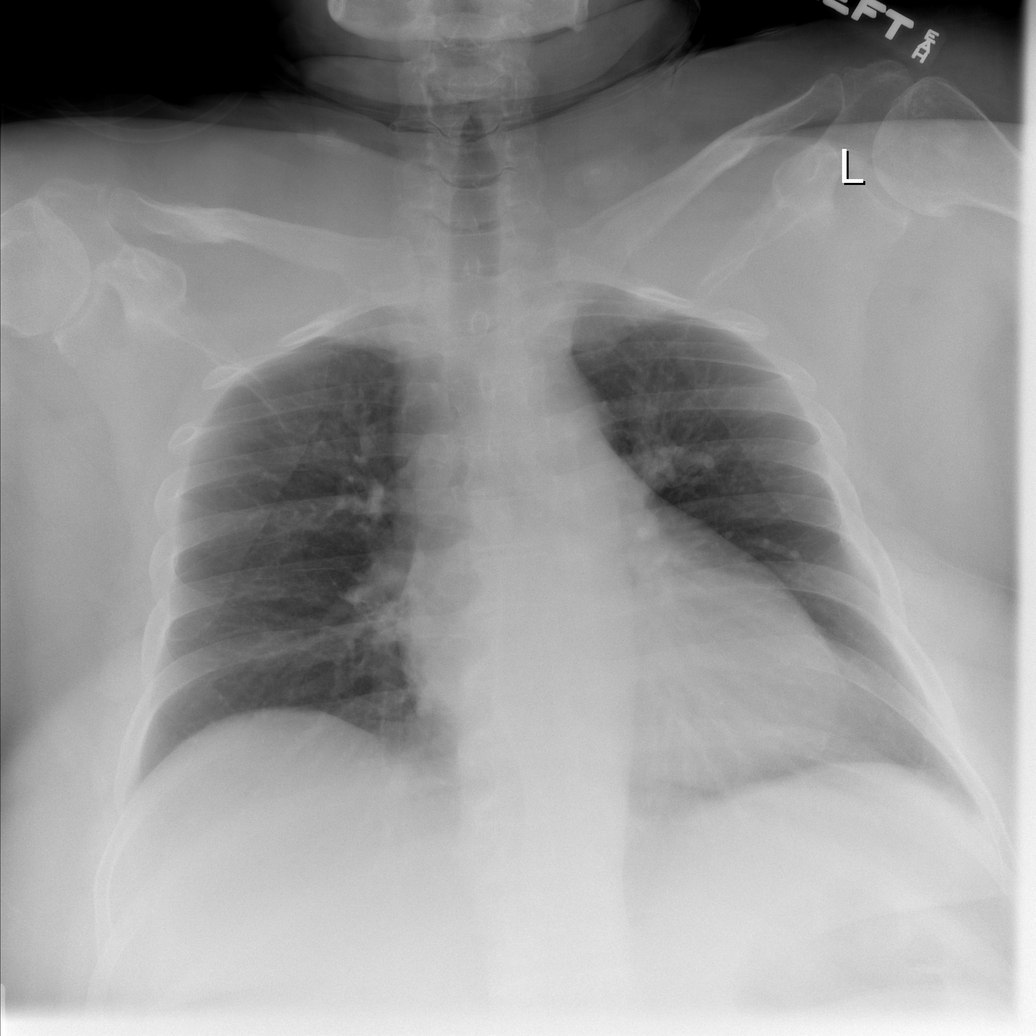

[4 of 4 positions shown; findings below may reference images not displayed]

FINDINGS: Single view of the chest demonstrates clear lungs and
normal heart size.  No pneumothorax or pleural effusion.

Two views of the abdomen show no free intraperitoneal air.  The
patient has a large volume of stool in the ascending and transverse
colon.  No evidence of bowel obstruction.
IMPRESSION: 1.  No acute abnormality.
2.  Large stool burden ascending and transverse colon.

## 2011-10-18 IMAGING — CT CT ABD-PELV W/ CM
1 of 3 series · 14 of 32 positions shown, 19 images · IV contrast (APPLIED)
Comparison: 11/29/2006

CLINICAL DATA: Abdominal pain, dysuria

CT ABDOMEN AND PELVIS WITH CONTRAST
TECHNIQUE: Multidetector CT imaging of the abdomen and pelvis was
performed following the standard protocol during bolus
administration of intravenous contrast.
Contrast: 100 ml Omniscan 300 IV contrast

[Series 2: abd/pelv with 5.0 b31s st · axial · 0.98mm/px · z∈[-464,-28]mm · 14 of 99 slices shown, 19 images]
[im 6/99  soft-tissue]
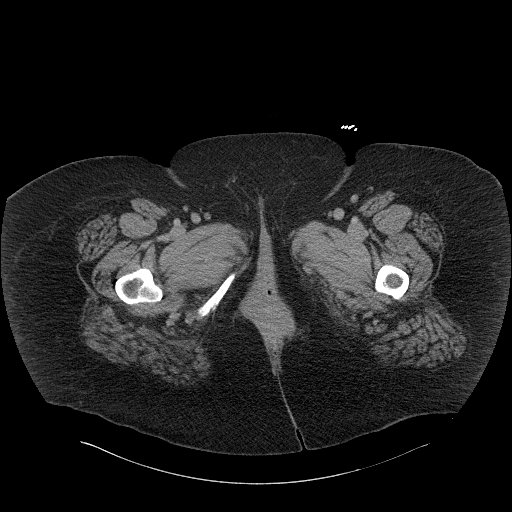
[im 6/99  bone]
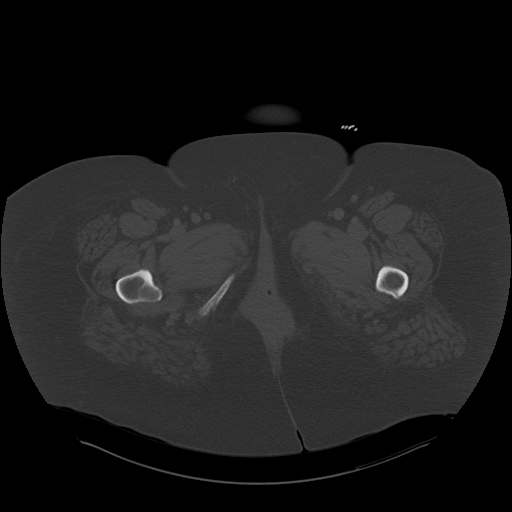
[im 16/99  soft-tissue]
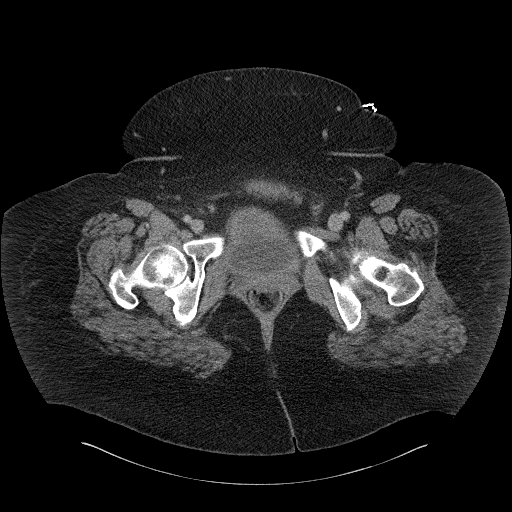
[im 21/99  soft-tissue]
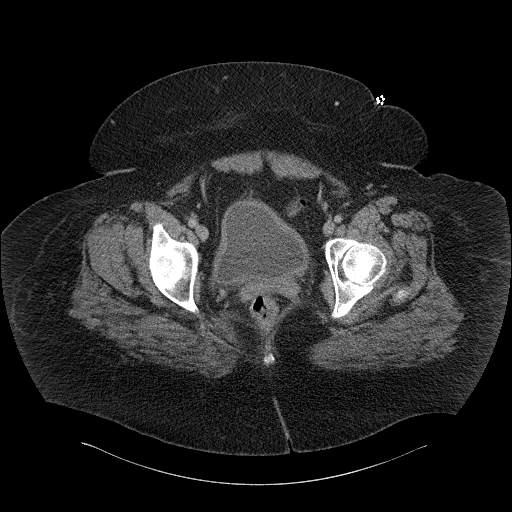
[im 26/99  soft-tissue]
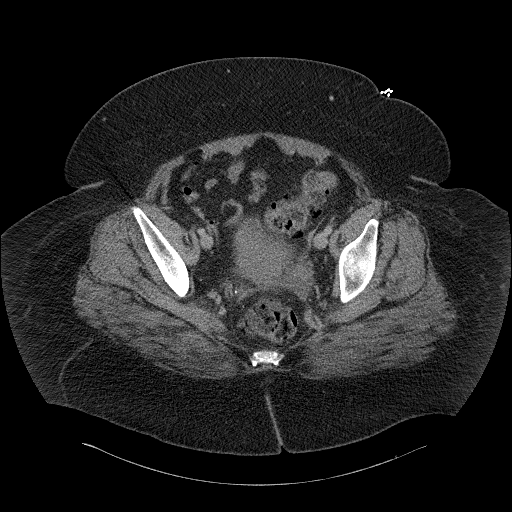
[im 37/99  soft-tissue]
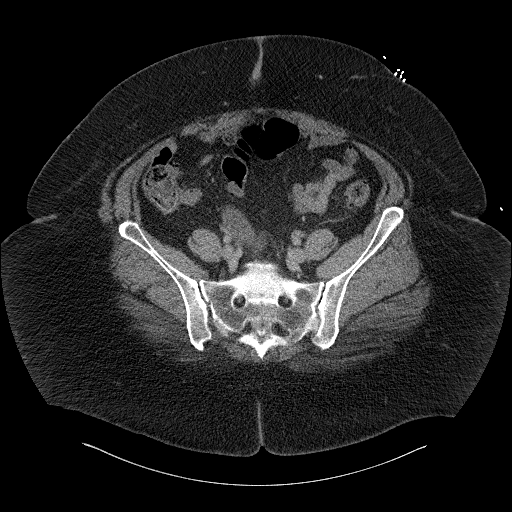
[im 42/99  soft-tissue]
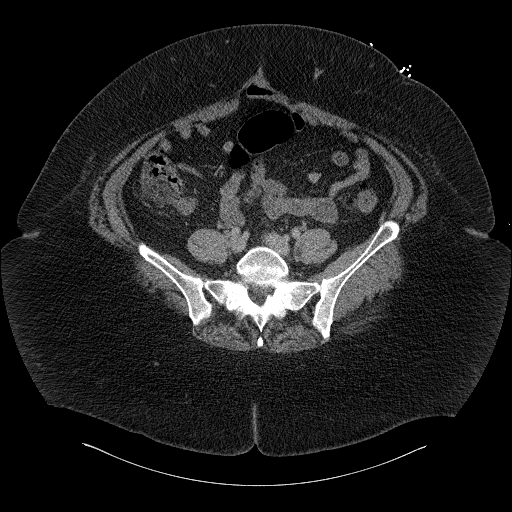
[im 52/99  soft-tissue]
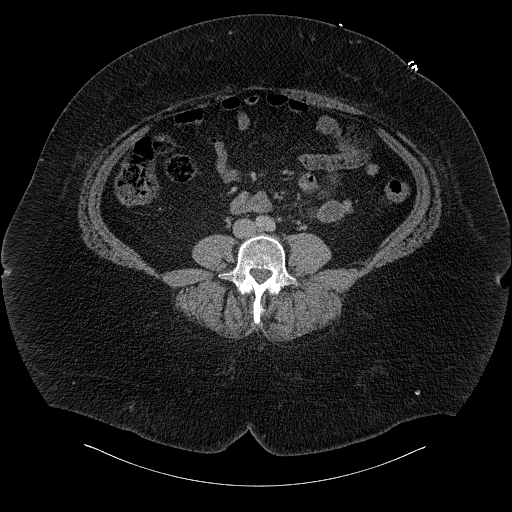
[im 57/99  soft-tissue]
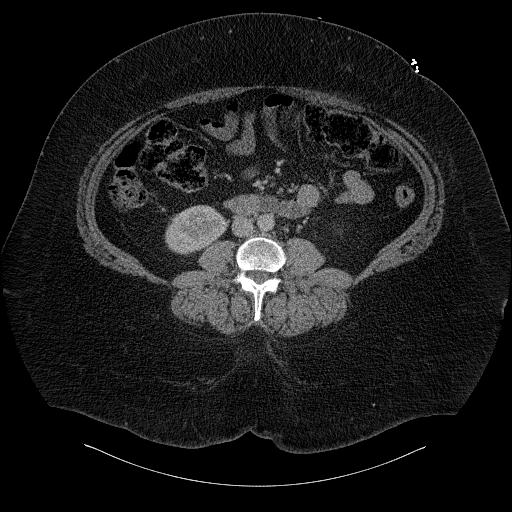
[im 62/99  soft-tissue]
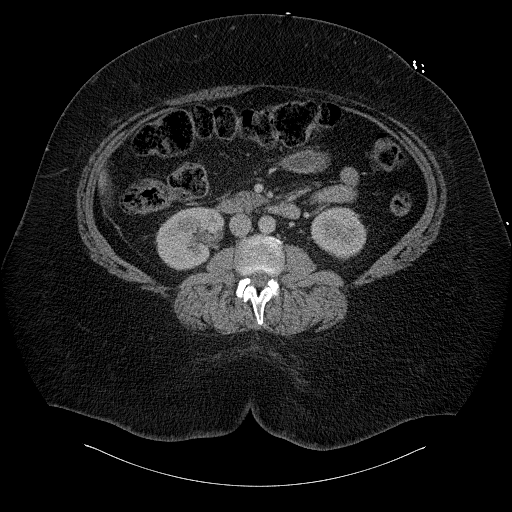
[im 62/99  bone]
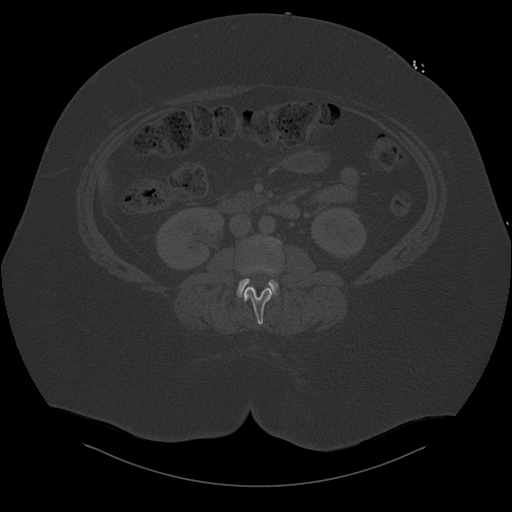
[im 73/99  soft-tissue]
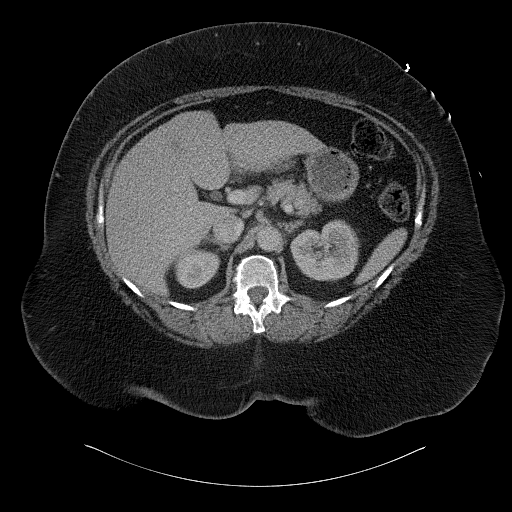
[im 78/99  soft-tissue]
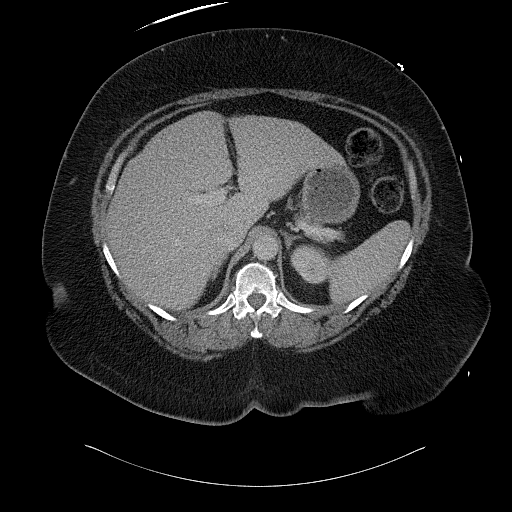
[im 78/99  lung]
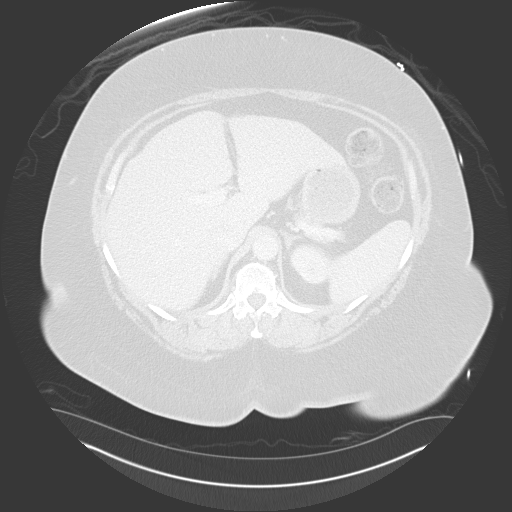
[im 83/99  soft-tissue]
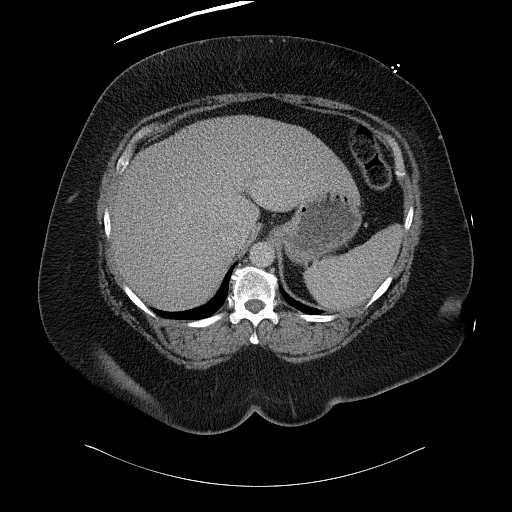
[im 83/99  lung]
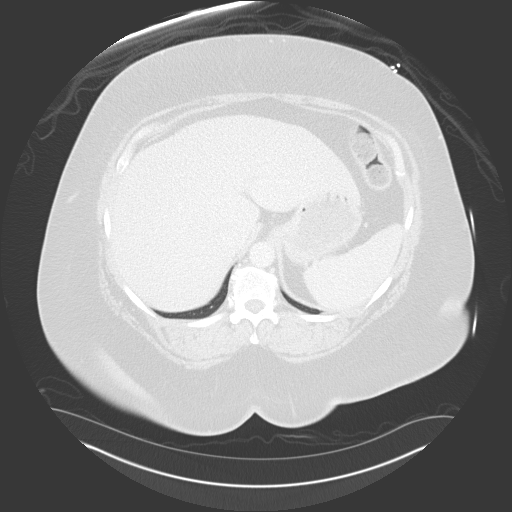
[im 88/99  lung]
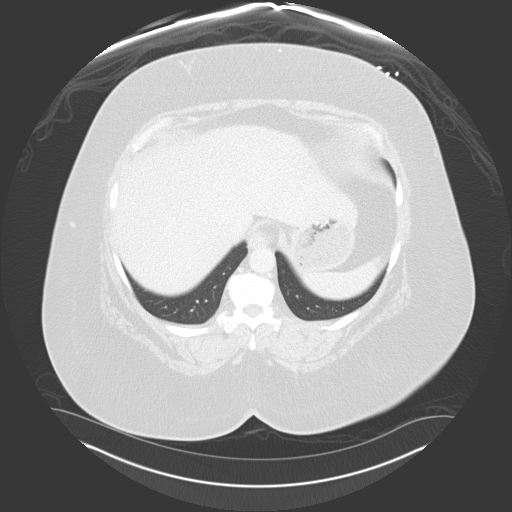
[im 93/99  soft-tissue]
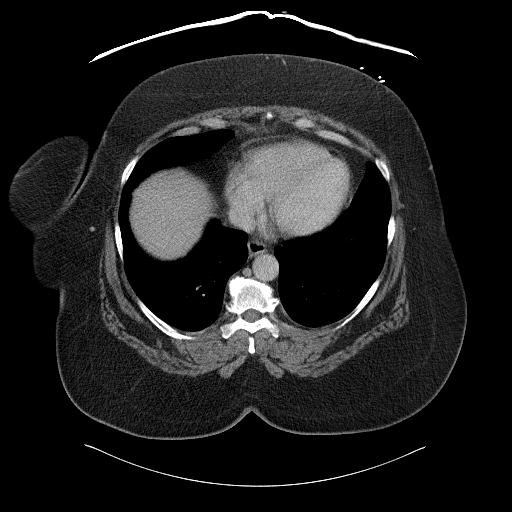
[im 93/99  lung]
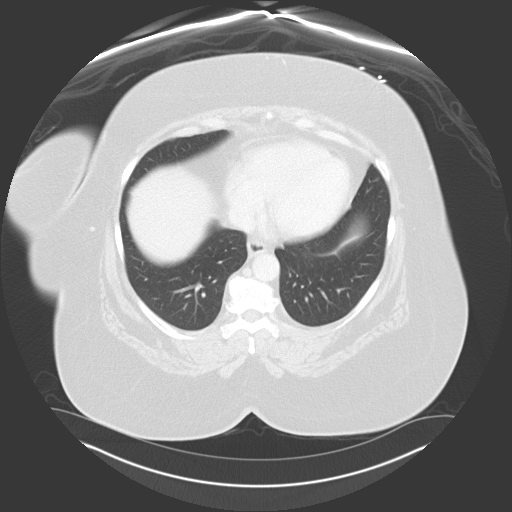

[14 of 32 positions shown; findings below may reference images not displayed]

FINDINGS: Lung bases are clear. Cholecystectomy clips noted.  1 cm
too small to characterize left renal cortical hypodensity is new
since the prior studies.  Adrenal glands, right kidney, liver,
spleen, and pancreas are unremarkable.  No ascites or
lymphadenopathy.

The ovaries are unremarkable.  Bladder wall thickness is at upper
limits of normal.  Trace free pelvic fluid is present.  Uterus
presumed surgically absent.  No pelvic lymphadenopathy.  The
appendix and bowel are normal.
IMPRESSION: Bladder wall thickness at upper limits of normal, which could
indicate cystitis.  Correlate with urinalysis.

Too small to characterize left renal cortical hypodensity. Primary
considerations include a cyst but renal cell carcinoma could have a
similar appearance at [facility].  Outpatient nonemergent ultrasound could
be considered for their evaluation to distinguish this finding from
a cyst or solid neoplasm.

## 2011-10-18 IMAGING — CR DG CHEST 2V
2 series · 2 of 2 positions shown · non-contrast
Comparison: Chest radiograph performed 05/15/2010

CLINICAL DATA: Shortness of breath and mid sternal chest pain.

CHEST - 2 VIEW

[w chest pa *]
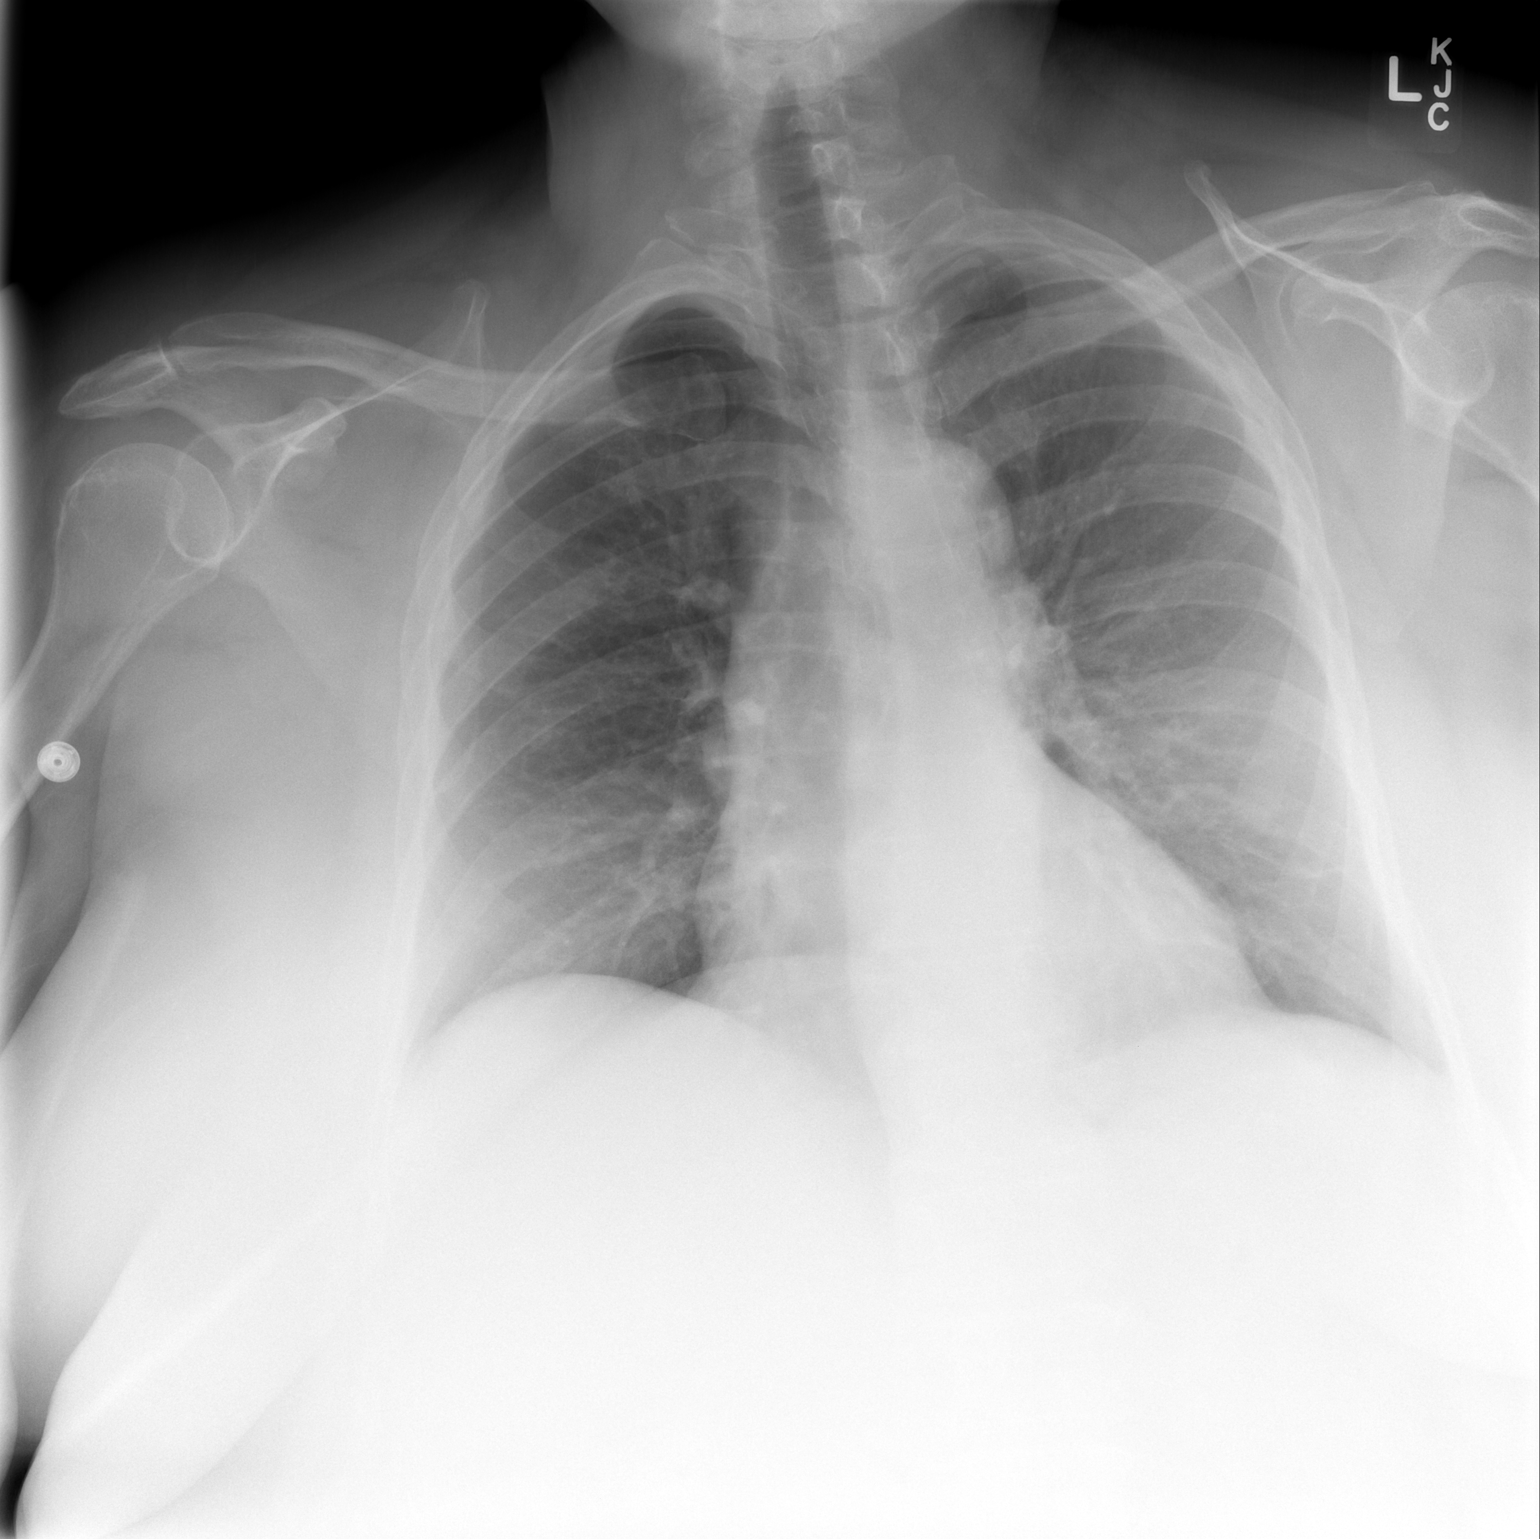

[w chest lat *]
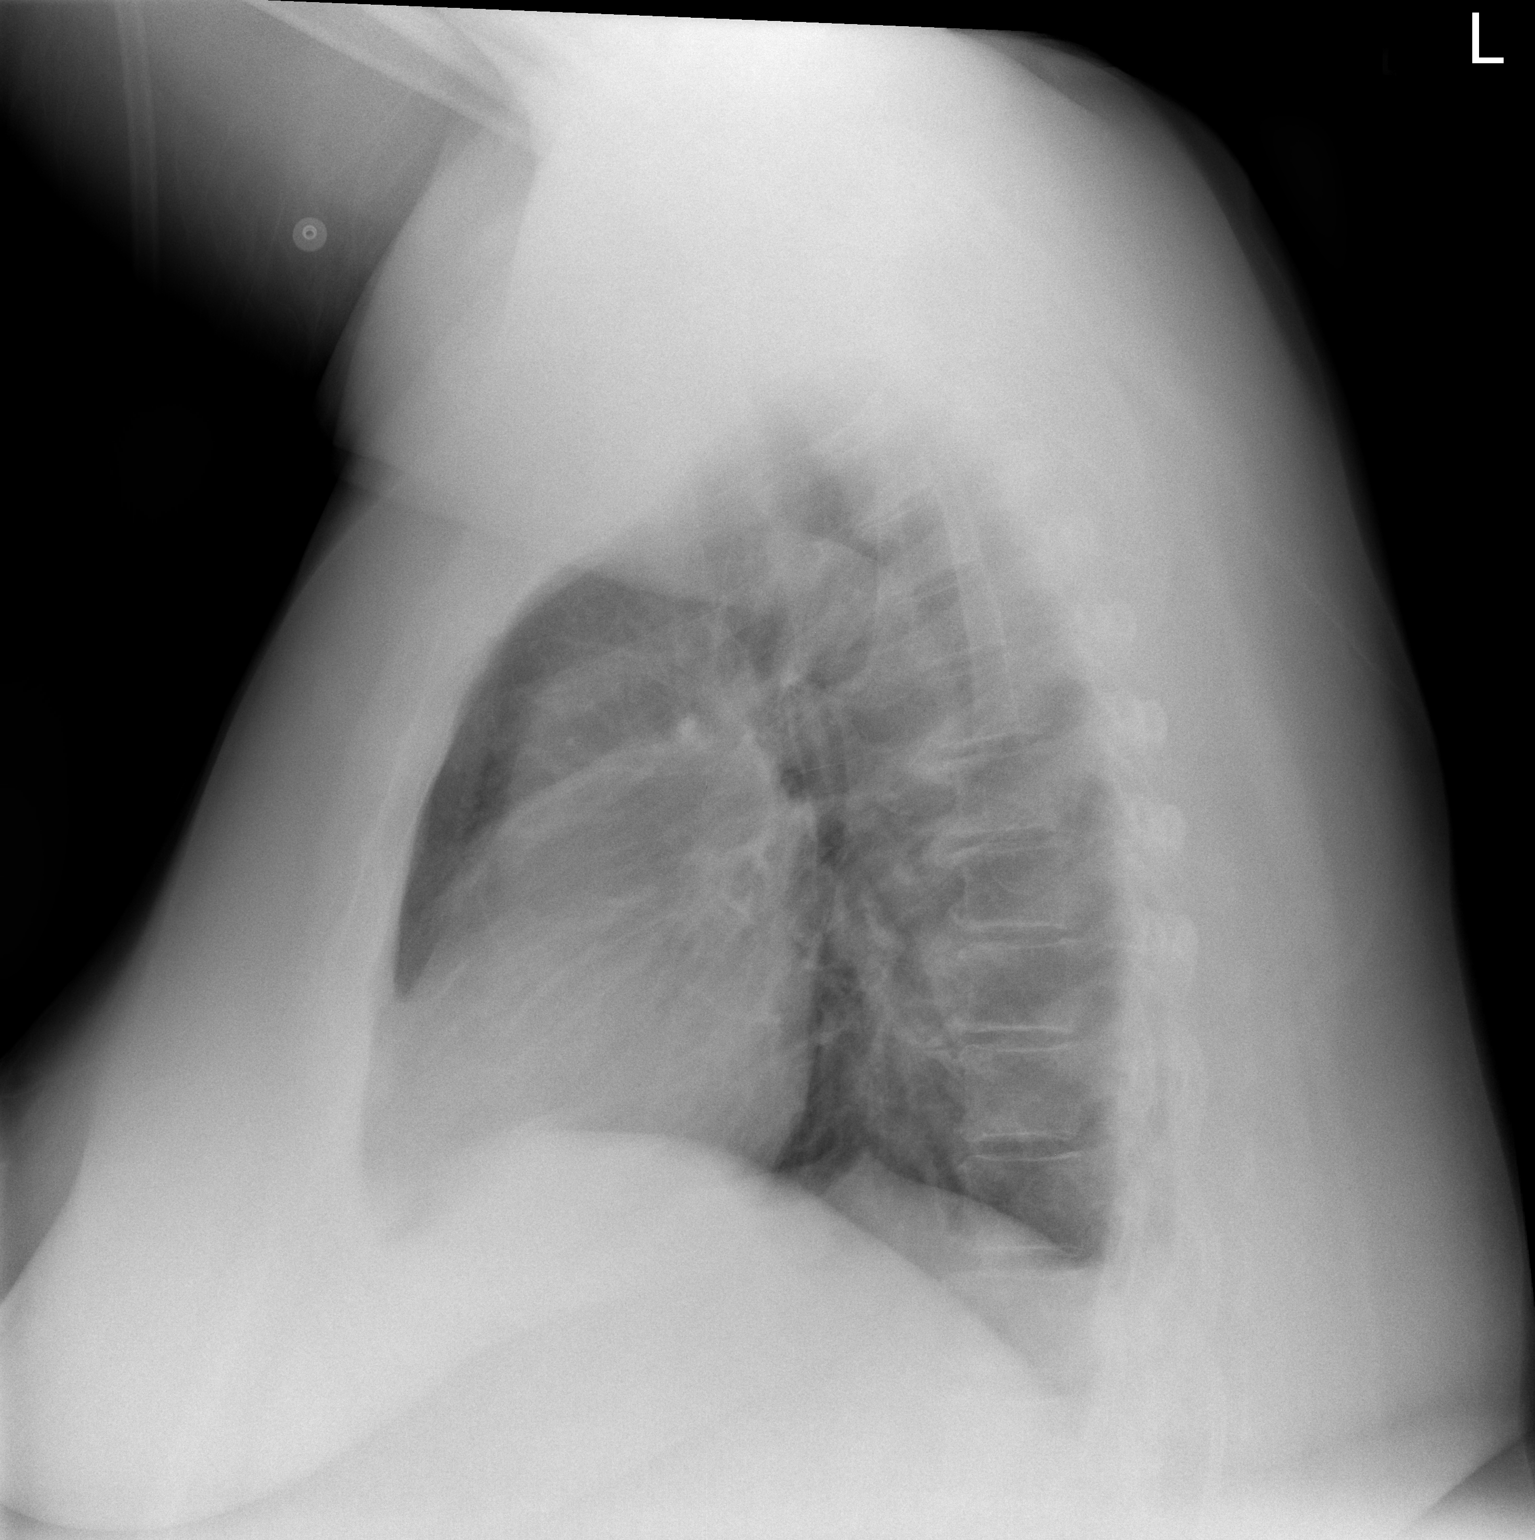

[2 of 2 positions shown; findings below may reference images not displayed]

FINDINGS: The lungs are well-aerated; minimal left-sided
atelectasis is noted.  There is no evidence of pleural effusion or
pneumothorax.

The heart is normal in size; the mediastinal contour is within
normal limits.  No acute osseous abnormalities are seen; mild
degenerative change is noted along the thoracic spine.
IMPRESSION: Minimal left-sided atelectasis noted; lungs otherwise clear.

## 2011-10-26 ENCOUNTER — Encounter (HOSPITAL_COMMUNITY): Payer: Self-pay | Admitting: Emergency Medicine

## 2011-10-26 ENCOUNTER — Emergency Department (HOSPITAL_COMMUNITY)
Admission: EM | Admit: 2011-10-26 | Discharge: 2011-10-27 | Disposition: A | Discharge status: Home / Self Care | Payer: Medicare Other | Attending: Emergency Medicine | Admitting: Emergency Medicine

## 2011-10-26 DIAGNOSIS — Z87891 Personal history of nicotine dependence: Secondary | ICD-10-CM | POA: Insufficient documentation

## 2011-10-26 DIAGNOSIS — E039 Hypothyroidism, unspecified: Secondary | ICD-10-CM | POA: Insufficient documentation

## 2011-10-26 DIAGNOSIS — J4489 Other specified chronic obstructive pulmonary disease: Secondary | ICD-10-CM | POA: Insufficient documentation

## 2011-10-26 DIAGNOSIS — F259 Schizoaffective disorder, unspecified: Secondary | ICD-10-CM | POA: Insufficient documentation

## 2011-10-26 DIAGNOSIS — J449 Chronic obstructive pulmonary disease, unspecified: Secondary | ICD-10-CM | POA: Insufficient documentation

## 2011-10-26 DIAGNOSIS — Z8701 Personal history of pneumonia (recurrent): Secondary | ICD-10-CM | POA: Insufficient documentation

## 2011-10-26 DIAGNOSIS — R609 Edema, unspecified: Secondary | ICD-10-CM | POA: Insufficient documentation

## 2011-10-26 DIAGNOSIS — E119 Type 2 diabetes mellitus without complications: Secondary | ICD-10-CM | POA: Insufficient documentation

## 2011-10-26 DIAGNOSIS — I4891 Unspecified atrial fibrillation: Secondary | ICD-10-CM | POA: Insufficient documentation

## 2011-10-26 DIAGNOSIS — I10 Essential (primary) hypertension: Secondary | ICD-10-CM | POA: Insufficient documentation

## 2011-10-26 DIAGNOSIS — I252 Old myocardial infarction: Secondary | ICD-10-CM | POA: Insufficient documentation

## 2011-10-26 DIAGNOSIS — I509 Heart failure, unspecified: Secondary | ICD-10-CM | POA: Insufficient documentation

## 2011-10-26 DIAGNOSIS — N390 Urinary tract infection, site not specified: Secondary | ICD-10-CM | POA: Insufficient documentation

## 2011-10-26 DIAGNOSIS — R079 Chest pain, unspecified: Secondary | ICD-10-CM | POA: Insufficient documentation

## 2011-10-26 DIAGNOSIS — R319 Hematuria, unspecified: Secondary | ICD-10-CM | POA: Insufficient documentation

## 2011-10-26 MED ORDER — SODIUM CHLORIDE 0.9 % IV BOLUS (SEPSIS)
1000.0000 mL | Freq: Once | INTRAVENOUS | Status: AC
  Administered 2011-10-27: 1000 mL via INTRAVENOUS

## 2011-10-26 NOTE — ED Notes (Signed)
Patient complains of SOB,  mid sternal CP and vaginal pain that started at the same time. Patient also reports vaginal bleeding.

## 2011-10-26 NOTE — ED Notes (Signed)
Patient was given 1 nitro 0.4 mg SL and 324 ASA by EMS.

## 2011-10-26 NOTE — ED Notes (Signed)
[id number]<GN> Expected date:<BR> Expected time: 8:46 PM<BR> Means of arrival:<BR> Comments:<BR> M10 - [id number] Vaginal pain and chest pain

## 2011-10-26 NOTE — ED Provider Notes (Signed)
History     CSN: [id number]  Arrival date & time 10/26/11  2048   First MD Initiated Contact with Patient 10/26/11 2314      Chief Complaint  Patient presents with  . Chest Pain  . Vaginal Bleeding    (Consider location/radiation/quality/duration/timing/severity/associated sxs/prior treatment) HPI The patient presents with 2 chief complaints.  She notes that she has been in her usual state of health.  Approximately 4 hours prior to presentation the patient developed chest pain and hematuria simultaneously.  She notes that the chest pain is anterior with radiation to both shoulders, and her neck, his sharp, not pleuritic or exertional.  She has baseline dyspnea due to COPD, and this is increased.  She denies any abdominal pain, anorexia, nausea, diarrhea.  She was in the bathroom and noticed that she had hematuria.  She also denies any polyuria or dysuria. Notably, the patient presents with her husband.  The patient's husband notes that her schizoaffective disorder is prominence, and the patient is not a reliable historian. Past Medical History  Diagnosis Date  . Schizoaffective disorder   . Urinary tract infection   . Hypertension   . Atrial fibrillation   . GERD (gastroesophageal reflux disease)   . Hypothyroidism   . Myocardial infarction   . Diabetes mellitus   . Obesity   . Asthma   . COPD (chronic obstructive pulmonary disease)   . Pneumonia   . CHF (congestive heart failure)     Past Surgical History  Procedure Date  . Tubal ligation   . Abdominal hysterectomy     History reviewed. No pertinent family history.  History  Substance Use Topics  . Smoking status: Former Smoker -- 0.0 packs/day  . Smokeless tobacco: Current User    Types: Snuff, Chew  . Alcohol Use: No    OB History    Grav Para Term Preterm Abortions TAB SAB Ect Mult Living                  Review of Systems  Unable to perform ROS: Psychiatric disorder    Allergies  Sulfamethoxazole  w-trimethoprim and Penicillins  Home Medications   Current Outpatient Rx  Name Route Sig Dispense Refill  . ALBUTEROL SULFATE HFA 108 (90 BASE) MCG/ACT IN AERS Inhalation Inhale 2 puffs into the lungs every 6 (six) hours as needed. For wheezing    . AMLODIPINE BESYLATE 5 MG PO TABS Oral Take 5 mg by mouth daily.    . ASPIRIN EC 81 MG PO TBEC Oral Take 81 mg by mouth every morning.     Marland Kitchen CHLORPROMAZINE HCL 25 MG PO TABS Oral Take 25-50 mg by mouth 2 (two) times daily. Take 1 tablet in the morning and 2 tablets at bedtime    . DIVALPROEX SODIUM ER 500 MG PO TB24 Oral Take 1,000 mg by mouth 2 (two) times daily.    Marland Kitchen FLUTICASONE PROPIONATE 50 MCG/ACT NA SUSP Nasal Place 2 sprays into the nose daily as needed. For nasal congestion    . FOLIC ACID 1 MG PO TABS Oral Take 1 mg by mouth every morning.     Marland Kitchen LEVOTHYROXINE SODIUM 150 MCG PO TABS Oral Take 150 mcg by mouth daily.    Marland Kitchen METOPROLOL TARTRATE 50 MG PO TABS Oral Take 50 mg by mouth 2 (two) times daily.    Marland Kitchen NAPROXEN 500 MG PO TABS Oral Take 1 tablet (500 mg total) by mouth 2 (two) times daily with a meal. 30 tablet  0  . OMEPRAZOLE 20 MG PO CPDR Oral Take 20 mg by mouth daily.     Marland Kitchen PALIPERIDONE ER 6 MG PO TB24 Oral Take 6 mg by mouth 2 (two) times daily.      BP 106/63  Pulse 77  Temp 98.2 F (36.8 C)  Resp 19  SpO2 99%  Physical Exam  Nursing note and vitals reviewed. Constitutional: She is oriented to person, place, and time. She appears well-developed and well-nourished. No distress.  HENT:  Head: Normocephalic and atraumatic.  Eyes: Conjunctivae and EOM are normal.  Cardiovascular: Normal rate and regular rhythm.   Pulmonary/Chest: Effort normal and breath sounds normal. No stridor. No respiratory distress.  Abdominal: Soft. She exhibits no distension. There is no tenderness. There is no rebound.  Musculoskeletal: She exhibits edema. She exhibits no tenderness.  Neurological: She is alert and oriented to person, place, and  time. No cranial nerve deficit.  Skin: Skin is warm and dry.  Psychiatric: She has a normal mood and affect.    ED Course  Procedures (including critical care time)   Labs Reviewed  CBC  DIFFERENTIAL  COMPREHENSIVE METABOLIC PANEL  URINALYSIS, ROUTINE W REFLEX MICROSCOPIC  TROPONIN I   No results found.   No diagnosis found.  Cardiac 75 sinus rhythm normal Pulse oximetry 100% on nasal cannula abnormal    Date: 10/27/2011  Rate: 78  Rhythm: junctional rhythm  QRS Axis: right  Intervals: normal  ST/T Wave abnormalities: nonspecific T wave changes  Conduction Disutrbances:none  Narrative Interpretation:   Old EKG Reviewed: none available Abn ecg '  MDM  This elderly appearing female, who is in no distress on my initial exam is unremarkable vital signs presents with concerns of chest pain and hematuria.  The patient denies any vaginal bleeding, which was the nursing note for chief complaint.  She also denies any vaginal pain or discharge.  The patient's history of multiple medical problems, is concerning for ongoing cardiac ischemia, or CHF/COPD exacerbation.  However, the patient's unremarkable vital signs, her relatively reassuring laboratory evaluation, and the absence of any distress throughout her emergency room stay he is reassuring for the low suspicion of any these phenomena.  The patient's labs are notable for a urinary tract infection, which is consistent with the patient's description of hematuria.  The patient was discharged with antibiotics, with instructions to follow up with her primary care physician.     Gerhard Munch, MD 10/27/11 [phone]

## 2011-10-27 ENCOUNTER — Emergency Department (HOSPITAL_COMMUNITY): Payer: Medicare Other

## 2011-10-27 LAB — CBC
HCT: 38.2 % (ref 36.0–46.0)
Hemoglobin: 12.8 g/dL (ref 12.0–15.0)
MCH: 33.6 pg (ref 26.0–34.0)
MCHC: 33.5 g/dL (ref 30.0–36.0)

## 2011-10-27 LAB — URINALYSIS, ROUTINE W REFLEX MICROSCOPIC
Bilirubin Urine: NEGATIVE
Hgb urine dipstick: NEGATIVE
Ketones, ur: NEGATIVE mg/dL
Protein, ur: NEGATIVE mg/dL
Urobilinogen, UA: 0.2 mg/dL (ref 0.0–1.0)

## 2011-10-27 LAB — DIFFERENTIAL
Basophils Relative: 0 % (ref 0–1)
Eosinophils Absolute: 0 10*3/uL (ref 0.0–0.7)
Monocytes Absolute: 0.6 10*3/uL (ref 0.1–1.0)
Monocytes Relative: 11 % (ref 3–12)

## 2011-10-27 LAB — COMPREHENSIVE METABOLIC PANEL
ALT: 14 U/L (ref 0–35)
AST: 19 U/L (ref 0–37)
Alkaline Phosphatase: 53 U/L (ref 39–117)
CO2: 33 mEq/L — ABNORMAL HIGH (ref 19–32)
Chloride: 99 mEq/L (ref 96–112)
GFR calc non Af Amer: >90 mL/min (ref >90)
Sodium: 138 mEq/L (ref 135–145)
Total Bilirubin: 0.2 mg/dL — ABNORMAL LOW (ref 0.3–1.2)

## 2011-10-27 LAB — URINE MICROSCOPIC-ADD ON

## 2011-10-27 LAB — PRO B NATRIURETIC PEPTIDE: Pro B Natriuretic peptide (BNP): 115.7 pg/mL (ref 0–125)

## 2011-10-27 IMAGING — CR DG CHEST 2V
1 series · 1 of 1 positions shown · non-contrast
Comparison: 06/20/2010.

CLINICAL DATA: Left chest pain with shortness of breath, cough and
fever.

CHEST - 2 VIEW

[view not recorded]
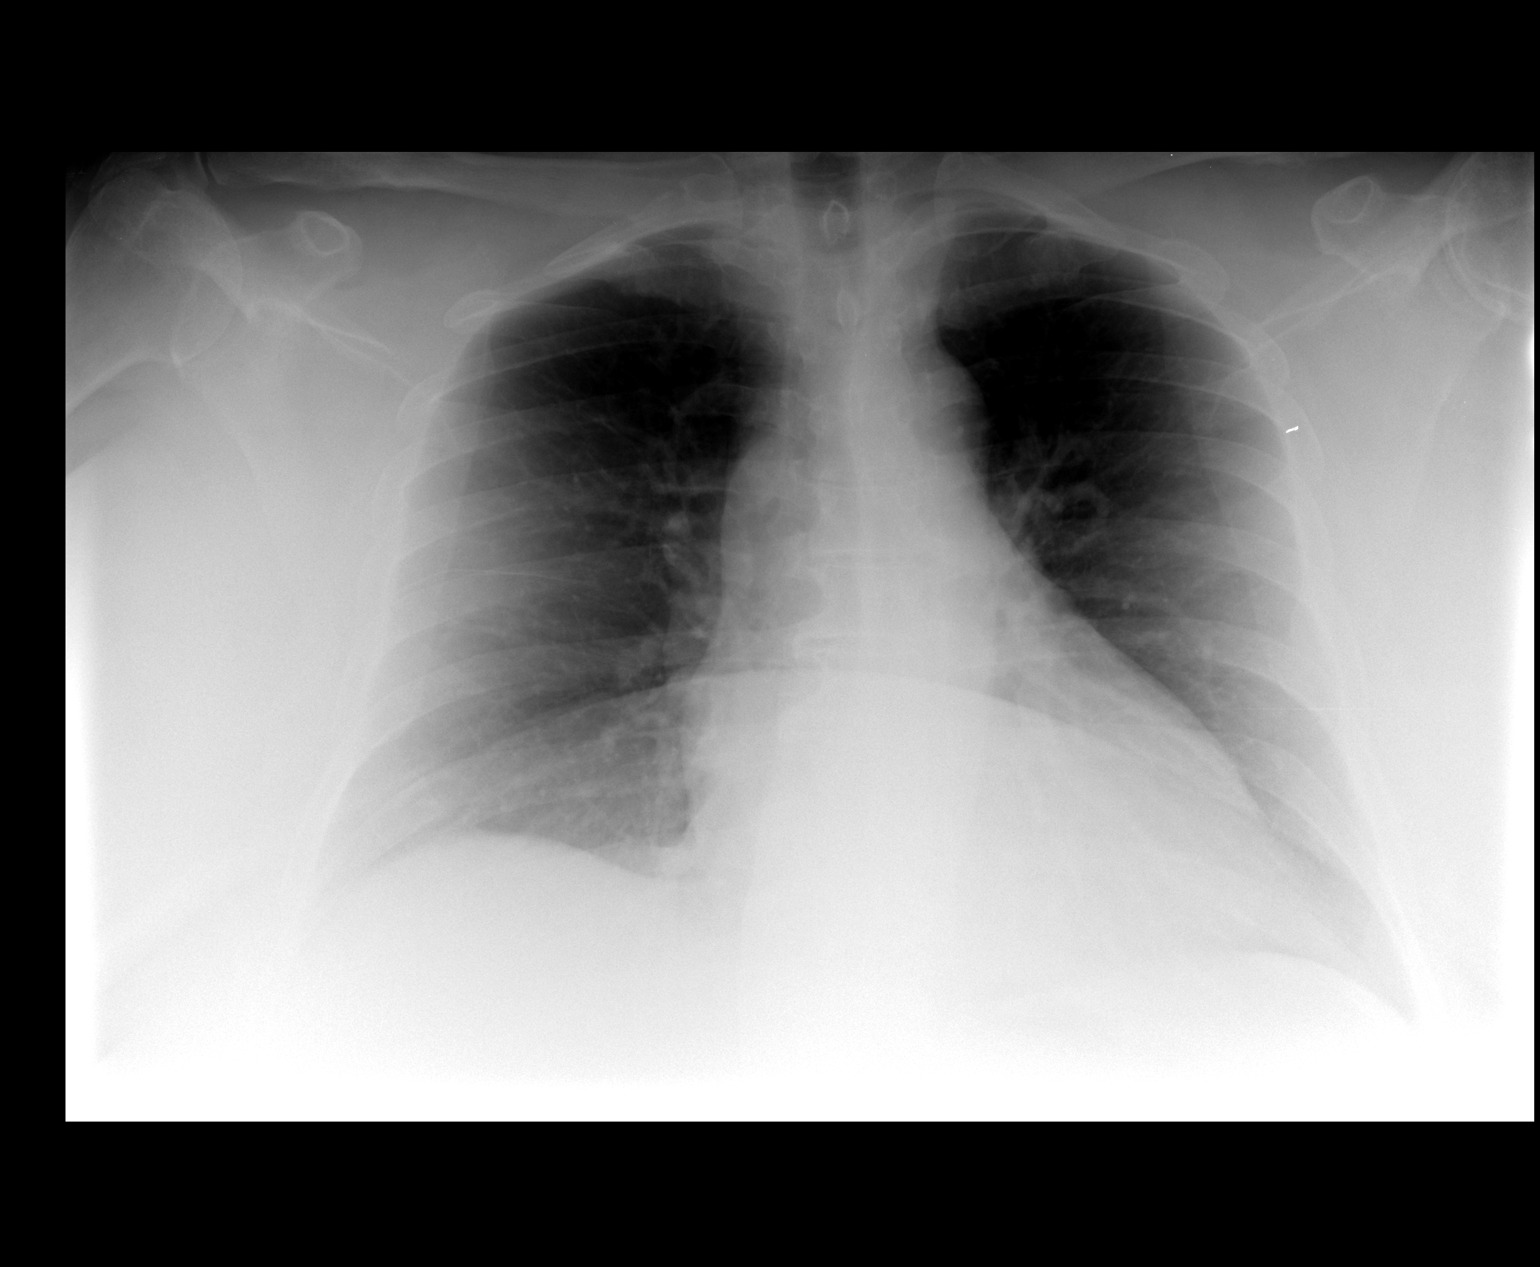

[1 of 1 positions shown; findings below may reference images not displayed]

FINDINGS: The heart size and mediastinal contours are stable.  The
lungs are clear.  There is no pleural effusion or pneumothorax.
Skin folds overlie the lower chest.
IMPRESSION: No active cardiopulmonary process.

## 2011-10-27 MED ORDER — CEPHALEXIN 500 MG PO CAPS: 500.0000 mg | ORAL_CAPSULE | Freq: Two times a day (BID) | ORAL | Status: AC

## 2011-10-27 NOTE — ED Notes (Signed)
Patient discharged via wheelchair with spouse. Respirations equal and unlabored. Skin and warm and dry. No acute distress noted.

## 2011-10-27 NOTE — Discharge Instructions (Signed)

## 2011-11-07 ENCOUNTER — Encounter (HOSPITAL_COMMUNITY): Payer: Self-pay | Admitting: *Deleted

## 2011-11-07 ENCOUNTER — Emergency Department (HOSPITAL_COMMUNITY): Payer: Medicare Other

## 2011-11-07 ENCOUNTER — Emergency Department (HOSPITAL_COMMUNITY)
Admission: EM | Admit: 2011-11-07 | Discharge: 2011-11-08 | Disposition: A | Discharge status: Other Acute Inpatient Hospital | Payer: Medicare Other | Attending: Emergency Medicine | Admitting: Emergency Medicine

## 2011-11-07 DIAGNOSIS — M25519 Pain in unspecified shoulder: Secondary | ICD-10-CM | POA: Insufficient documentation

## 2011-11-07 DIAGNOSIS — R45851 Suicidal ideations: Secondary | ICD-10-CM

## 2011-11-07 DIAGNOSIS — I509 Heart failure, unspecified: Secondary | ICD-10-CM | POA: Insufficient documentation

## 2011-11-07 DIAGNOSIS — J449 Chronic obstructive pulmonary disease, unspecified: Secondary | ICD-10-CM | POA: Insufficient documentation

## 2011-11-07 DIAGNOSIS — I252 Old myocardial infarction: Secondary | ICD-10-CM | POA: Insufficient documentation

## 2011-11-07 DIAGNOSIS — Z8659 Personal history of other mental and behavioral disorders: Secondary | ICD-10-CM | POA: Insufficient documentation

## 2011-11-07 DIAGNOSIS — Z79899 Other long term (current) drug therapy: Secondary | ICD-10-CM | POA: Insufficient documentation

## 2011-11-07 DIAGNOSIS — R079 Chest pain, unspecified: Secondary | ICD-10-CM | POA: Insufficient documentation

## 2011-11-07 DIAGNOSIS — I10 Essential (primary) hypertension: Secondary | ICD-10-CM | POA: Insufficient documentation

## 2011-11-07 DIAGNOSIS — J4489 Other specified chronic obstructive pulmonary disease: Secondary | ICD-10-CM | POA: Insufficient documentation

## 2011-11-07 DIAGNOSIS — E119 Type 2 diabetes mellitus without complications: Secondary | ICD-10-CM | POA: Insufficient documentation

## 2011-11-07 DIAGNOSIS — K219 Gastro-esophageal reflux disease without esophagitis: Secondary | ICD-10-CM | POA: Insufficient documentation

## 2011-11-07 DIAGNOSIS — E039 Hypothyroidism, unspecified: Secondary | ICD-10-CM | POA: Insufficient documentation

## 2011-11-07 LAB — BASIC METABOLIC PANEL
CO2: 29 mEq/L (ref 19–32)
Chloride: 99 mEq/L (ref 96–112)
Creatinine, Ser: 0.81 mg/dL (ref 0.50–1.10)
Sodium: 137 mEq/L (ref 135–145)

## 2011-11-07 LAB — CBC
HCT: 40.7 % (ref 36.0–46.0)
Hemoglobin: 13.5 g/dL (ref 12.0–15.0)
RDW: 13.4 % (ref 11.5–15.5)
WBC: 5.2 10*3/uL (ref 4.0–10.5)

## 2011-11-07 LAB — DIFFERENTIAL
Basophils Absolute: 0 10*3/uL (ref 0.0–0.1)
Lymphocytes Relative: 41 % (ref 12–46)
Monocytes Absolute: 0.6 10*3/uL (ref 0.1–1.0)
Neutro Abs: 2.4 10*3/uL (ref 1.7–7.7)

## 2011-11-07 LAB — RAPID URINE DRUG SCREEN, HOSP PERFORMED
Amphetamines: NOT DETECTED
Benzodiazepines: NOT DETECTED
Cocaine: NOT DETECTED
Opiates: NOT DETECTED

## 2011-11-07 LAB — URINALYSIS, ROUTINE W REFLEX MICROSCOPIC
Glucose, UA: NEGATIVE mg/dL
Leukocytes, UA: NEGATIVE
Specific Gravity, Urine: 1.013 (ref 1.005–1.030)
pH: 8 (ref 5.0–8.0)

## 2011-11-07 LAB — POCT I-STAT TROPONIN I: Troponin i, poc: 0.01 ng/mL (ref 0.00–0.08)

## 2011-11-07 IMAGING — CR DG CHEST 1V PORT
1 series · 1 of 1 positions shown · non-contrast
Comparison: Chest radiograph 06/29/2010

CLINICAL DATA: Chest pain

PORTABLE CHEST - 1 VIEW

[AP]
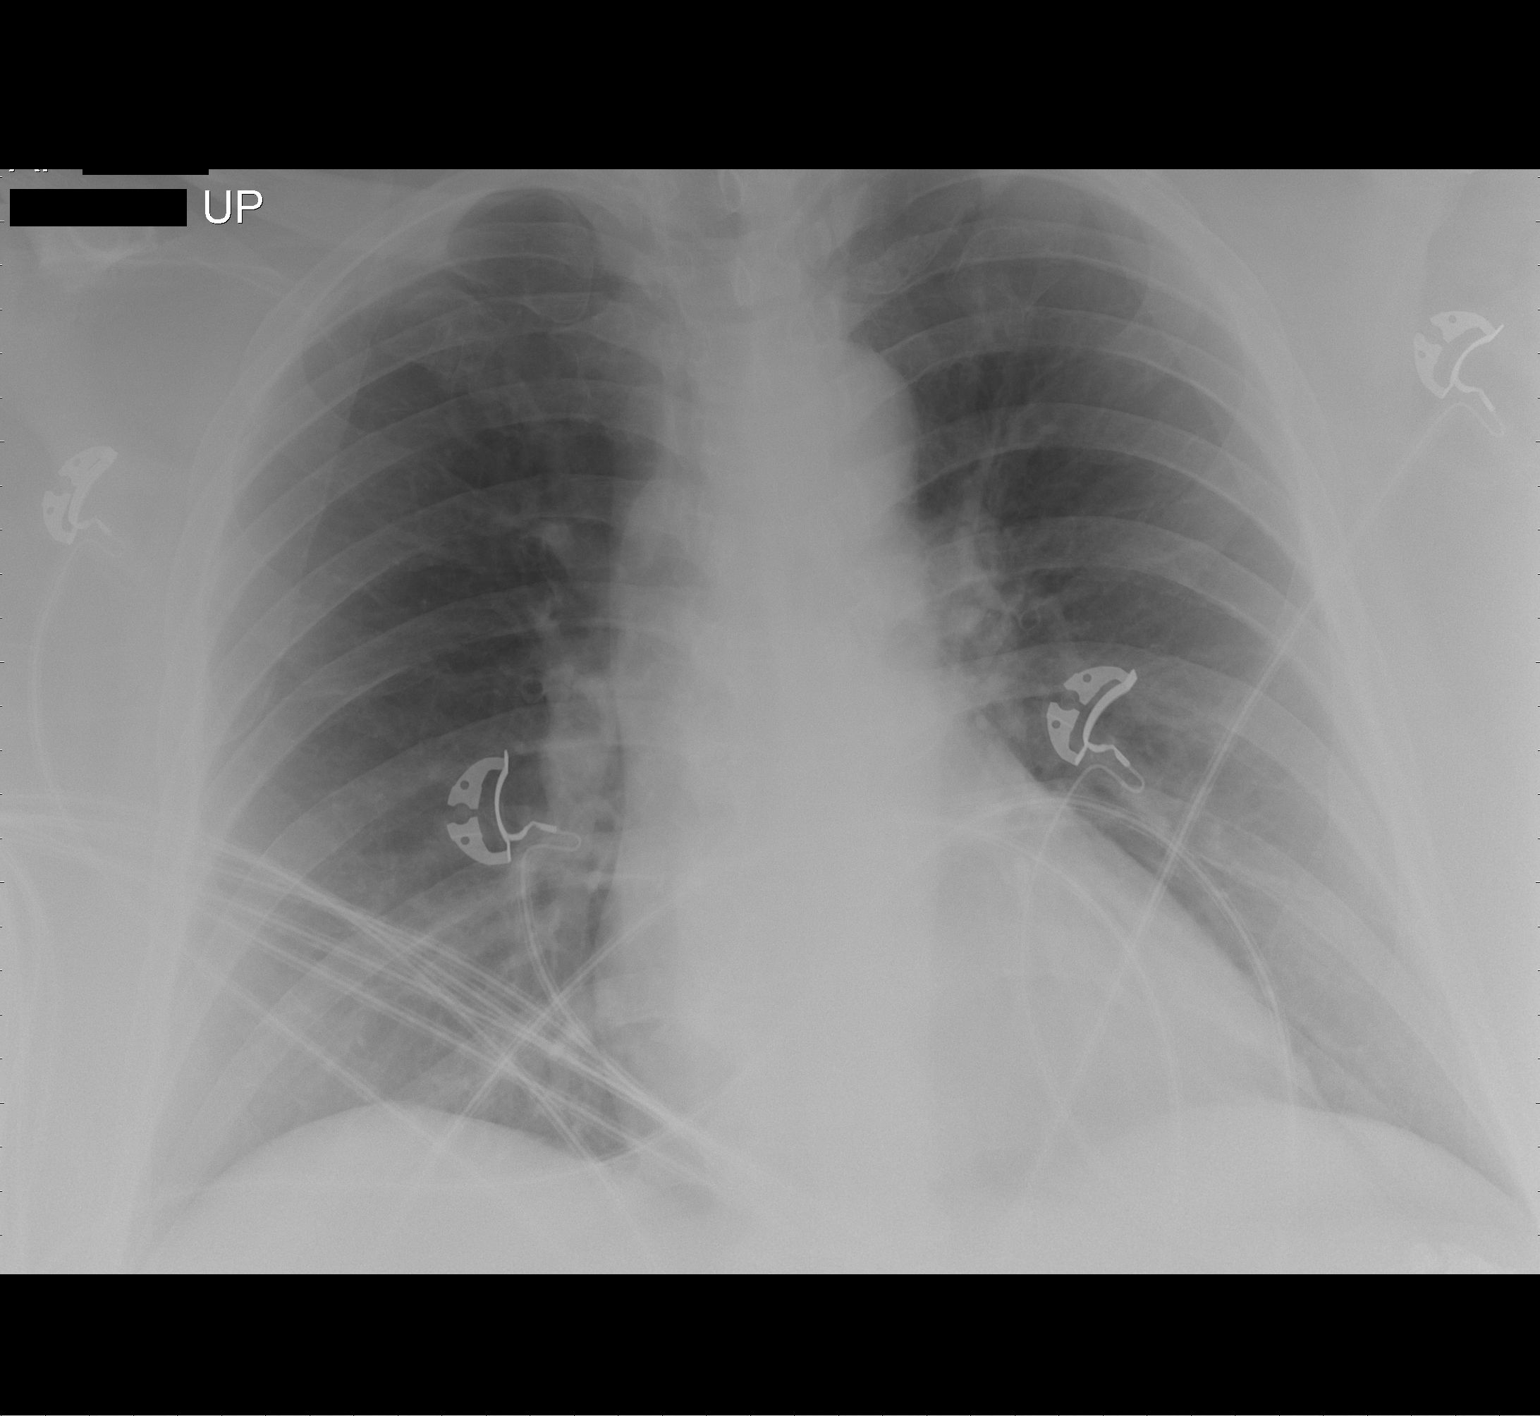

[1 of 1 positions shown; findings below may reference images not displayed]

FINDINGS: Cardiac leads project over the chest.  Heart and
mediastinal contours are stable and within normal limits for
portable technique.  Pulmonary vascularity is normal.  The lungs
are clear.  There is no edema, focal airspace disease, effusion, or
pneumothorax.
IMPRESSION: No acute cardiopulmonary disease.

## 2011-11-07 MED ORDER — CHLORPROMAZINE HCL 25 MG PO TABS
25.0000 mg | ORAL_TABLET | Freq: Every day | ORAL | Status: DC
  Administered 2011-11-08: 25 mg via ORAL
  Filled 2011-11-07: qty 1

## 2011-11-07 MED ORDER — IBUPROFEN 200 MG PO TABS: 600.0000 mg | ORAL_TABLET | Freq: Three times a day (TID) | ORAL | Status: DC | PRN

## 2011-11-07 MED ORDER — METOPROLOL TARTRATE 25 MG PO TABS
50.0000 mg | ORAL_TABLET | Freq: Two times a day (BID) | ORAL | Status: DC
  Administered 2011-11-07: 50 mg via ORAL
  Filled 2011-11-07 (×2): qty 2

## 2011-11-07 MED ORDER — FLUTICASONE PROPIONATE 50 MCG/ACT NA SUSP
2.0000 | Freq: Every day | NASAL | Status: DC
  Administered 2011-11-07 – 2011-11-08 (×2): 2 via NASAL
  Filled 2011-11-07 (×2): qty 16

## 2011-11-07 MED ORDER — AMLODIPINE BESYLATE 5 MG PO TABS
5.0000 mg | ORAL_TABLET | Freq: Every day | ORAL | Status: DC
  Administered 2011-11-07: 5 mg via ORAL
  Filled 2011-11-07 (×3): qty 1

## 2011-11-07 MED ORDER — DIVALPROEX SODIUM ER 500 MG PO TB24
1000.0000 mg | ORAL_TABLET | Freq: Two times a day (BID) | ORAL | Status: DC
  Administered 2011-11-07 – 2011-11-08 (×2): 1000 mg via ORAL
  Filled 2011-11-07 (×3): qty 2

## 2011-11-07 MED ORDER — ONDANSETRON HCL 8 MG PO TABS: 4.0000 mg | ORAL_TABLET | Freq: Three times a day (TID) | ORAL | Status: DC | PRN

## 2011-11-07 MED ORDER — LORAZEPAM 1 MG PO TABS
1.0000 mg | ORAL_TABLET | Freq: Three times a day (TID) | ORAL | Status: DC | PRN
  Administered 2011-11-08: 1 mg via ORAL
  Filled 2011-11-07: qty 1

## 2011-11-07 MED ORDER — ACETAMINOPHEN 325 MG PO TABS: 650.0000 mg | ORAL_TABLET | ORAL | Status: DC | PRN

## 2011-11-07 MED ORDER — ASPIRIN EC 81 MG PO TBEC
81.0000 mg | DELAYED_RELEASE_TABLET | ORAL | Status: DC
  Administered 2011-11-08: 81 mg via ORAL
  Filled 2011-11-07 (×2): qty 1

## 2011-11-07 MED ORDER — OXYCODONE-ACETAMINOPHEN 5-325 MG PO TABS
2.0000 | ORAL_TABLET | Freq: Once | ORAL | Status: AC
  Administered 2011-11-07: 2 via ORAL
  Filled 2011-11-07: qty 2

## 2011-11-07 MED ORDER — ZOLPIDEM TARTRATE 5 MG PO TABS
5.0000 mg | ORAL_TABLET | Freq: Every evening | ORAL | Status: DC | PRN
  Administered 2011-11-08: 5 mg via ORAL
  Filled 2011-11-07: qty 1

## 2011-11-07 MED ORDER — PALIPERIDONE ER 6 MG PO TB24
6.0000 mg | ORAL_TABLET | Freq: Two times a day (BID) | ORAL | Status: DC
  Administered 2011-11-07 – 2011-11-08 (×2): 6 mg via ORAL
  Filled 2011-11-07 (×3): qty 1

## 2011-11-07 MED ORDER — ALBUTEROL SULFATE HFA 108 (90 BASE) MCG/ACT IN AERS: 2.0000 | INHALATION_SPRAY | Freq: Four times a day (QID) | RESPIRATORY_TRACT | Status: DC | PRN

## 2011-11-07 MED ORDER — NAPROXEN 500 MG PO TABS
500.0000 mg | ORAL_TABLET | Freq: Two times a day (BID) | ORAL | Status: DC
  Administered 2011-11-07 – 2011-11-08 (×2): 500 mg via ORAL
  Filled 2011-11-07 (×4): qty 1

## 2011-11-07 MED ORDER — CHLORPROMAZINE HCL 25 MG PO TABS: 25.0000 mg | ORAL_TABLET | Freq: Two times a day (BID) | ORAL | Status: DC

## 2011-11-07 MED ORDER — LEVOTHYROXINE SODIUM 150 MCG PO TABS
150.0000 ug | ORAL_TABLET | Freq: Every day | ORAL | Status: DC
  Administered 2011-11-07 – 2011-11-08 (×2): 150 ug via ORAL
  Filled 2011-11-07 (×3): qty 1

## 2011-11-07 MED ORDER — FOLIC ACID 1 MG PO TABS
1.0000 mg | ORAL_TABLET | ORAL | Status: DC
  Administered 2011-11-08: 1 mg via ORAL
  Filled 2011-11-07: qty 1

## 2011-11-07 MED ORDER — CEPHALEXIN 250 MG PO CAPS
500.0000 mg | ORAL_CAPSULE | Freq: Two times a day (BID) | ORAL | Status: DC
  Administered 2011-11-07 – 2011-11-08 (×2): 500 mg via ORAL
  Filled 2011-11-07 (×2): qty 2

## 2011-11-07 MED ORDER — ASPIRIN 81 MG PO CHEW
324.0000 mg | CHEWABLE_TABLET | Freq: Once | ORAL | Status: AC
  Administered 2011-11-07: 324 mg via ORAL
  Filled 2011-11-07: qty 4

## 2011-11-07 MED ORDER — CHLORPROMAZINE HCL 50 MG PO TABS
50.0000 mg | ORAL_TABLET | Freq: Every day | ORAL | Status: DC
  Administered 2011-11-07: 50 mg via ORAL
  Filled 2011-11-07: qty 1

## 2011-11-07 NOTE — ED Notes (Signed)
Behavioral patient checklist and patient belonging inventory sheet completed. Belonging placed in [facility]

## 2011-11-07 NOTE — ED Notes (Signed)
Pt.'s EKG with Dr. Brooke Dare in [facility] lounge.

## 2011-11-07 NOTE — ED Provider Notes (Signed)
History     CSN: [id number]  Arrival date & time 11/07/11  1813   First MD Initiated Contact with Patient 11/07/11 1830      Chief Complaint  Patient presents with  . Chest Pain  . Suicidal    (Consider location/radiation/quality/duration/timing/severity/associated sxs/prior treatment) Patient is a 61 y.o. female presenting with chest pain. The history is provided by the patient. No language interpreter was used.  Chest Pain The chest pain began 3 - 5 hours ago. Chest pain occurs constantly. The chest pain is unchanged. Associated with: movement. The severity of the pain is moderate. The quality of the pain is described as aching and sharp. The pain does not radiate. Chest pain is worsened by certain positions. Pertinent negatives for primary symptoms include no fever, no fatigue, no shortness of breath, no cough, no palpitations, no abdominal pain, no nausea, no vomiting and no dizziness.  Pertinent negatives for associated symptoms include no diaphoresis, no lower extremity edema, no near-syncope, no numbness and no weakness. She tried nitroglycerin for the symptoms.     Past Medical History  Diagnosis Date  . Schizoaffective disorder   . Urinary tract infection   . Hypertension   . Atrial fibrillation   . GERD (gastroesophageal reflux disease)   . Hypothyroidism   . Myocardial infarction   . Diabetes mellitus   . Obesity   . Asthma   . COPD (chronic obstructive pulmonary disease)   . Pneumonia   . CHF (congestive heart failure)     Past Surgical History  Procedure Date  . Tubal ligation   . Abdominal hysterectomy     No family history on file.  History  Substance Use Topics  . Smoking status: Former Smoker -- 0.0 packs/day  . Smokeless tobacco: Current User    Types: Snuff, Chew  . Alcohol Use: No    OB History    Grav Para Term Preterm Abortions TAB SAB Ect Mult Living                  Review of Systems  Constitutional: Negative for fever,  diaphoresis, activity change, appetite change and fatigue.  HENT: Negative for congestion, sore throat, rhinorrhea, neck pain and neck stiffness.   Respiratory: Negative for cough and shortness of breath.   Cardiovascular: Positive for chest pain. Negative for palpitations and near-syncope.  Gastrointestinal: Negative for nausea, vomiting and abdominal pain.  Genitourinary: Negative for dysuria, urgency, frequency and flank pain.  Musculoskeletal: Positive for arthralgias. Negative for myalgias, back pain and joint swelling.  Neurological: Negative for dizziness, weakness, light-headedness, numbness and headaches.  All other systems reviewed and are negative.    Allergies  Sulfamethoxazole w-trimethoprim and Penicillins  Home Medications   Current Outpatient Rx  Name Route Sig Dispense Refill  . ALBUTEROL SULFATE HFA 108 (90 BASE) MCG/ACT IN AERS Inhalation Inhale 2 puffs into the lungs every 6 (six) hours as needed. For wheezing    . AMLODIPINE BESYLATE 5 MG PO TABS Oral Take 5 mg by mouth daily.    . ASPIRIN EC 81 MG PO TBEC Oral Take 81 mg by mouth every morning.     Marland Kitchen CHLORPROMAZINE HCL 25 MG PO TABS Oral Take 25-50 mg by mouth 2 (two) times daily. Take 1 tablet in the morning and 2 tablets at bedtime    . DIVALPROEX SODIUM ER 500 MG PO TB24 Oral Take 1,000 mg by mouth 2 (two) times daily.    Marland Kitchen FLUTICASONE PROPIONATE 50 MCG/ACT NA SUSP  Nasal Place 2 sprays into the nose daily as needed. For nasal congestion    . FOLIC ACID 1 MG PO TABS Oral Take 1 mg by mouth every morning.     Marland Kitchen LEVOTHYROXINE SODIUM 150 MCG PO TABS Oral Take 150 mcg by mouth daily.    Marland Kitchen METOPROLOL TARTRATE 50 MG PO TABS Oral Take 50 mg by mouth 2 (two) times daily.    . CEPHALEXIN 500 MG PO CAPS Oral Take 1 capsule (500 mg total) by mouth 2 (two) times daily. 20 capsule 0  . NAPROXEN 500 MG PO TABS Oral Take 1 tablet (500 mg total) by mouth 2 (two) times daily with a meal. 30 tablet 0  . OMEPRAZOLE 20 MG PO CPDR  Oral Take 20 mg by mouth daily.     Marland Kitchen PALIPERIDONE ER 6 MG PO TB24 Oral Take 6 mg by mouth 2 (two) times daily.      BP 137/84  Pulse 85  Temp(Src) 98.2 F (36.8 C) (Oral)  Resp 19  SpO2 94%  Physical Exam  Nursing note and vitals reviewed. Constitutional: She is oriented to person, place, and time. She appears well-developed and well-nourished. No distress.  HENT:  Head: Normocephalic and atraumatic.  Mouth/Throat: Oropharynx is clear and moist.  Eyes: Conjunctivae and EOM are normal. Pupils are equal, round, and reactive to light.  Neck: Normal range of motion. Neck supple.  Cardiovascular: Normal rate, regular rhythm and intact distal pulses.  Exam reveals no gallop and no friction rub.   No murmur heard. Pulmonary/Chest: Effort normal and breath sounds normal. She exhibits tenderness (pain on palpation of the L chest).  Abdominal: Soft. Bowel sounds are normal. There is no tenderness. There is no rebound and no guarding.  Musculoskeletal: Normal range of motion.       Pain on palpation of the L shoulder.   Neurological: She is alert and oriented to person, place, and time. No cranial nerve deficit.  Skin: Skin is warm and dry.    ED Course  Procedures (including critical care time)   Date: 11/07/2011  Rate: 77  Rhythm: normal sinus rhythm  QRS Axis: normal  Intervals: normal  ST/T Wave abnormalities: normal  Conduction Disutrbances:none  Narrative Interpretation:   Old EKG Reviewed: unchanged  Labs Reviewed  CBC - Abnormal; Notable for the following:    MCV 100.7 (*)    All other components within normal limits  BASIC METABOLIC PANEL - Abnormal; Notable for the following:    Glucose, Bld 101 (*)    GFR calc non Af Amer 79 (*)    All other components within normal limits  DIFFERENTIAL  URINALYSIS, ROUTINE W REFLEX MICROSCOPIC  URINE RAPID DRUG SCREEN (HOSP PERFORMED)  POCT I-STAT TROPONIN I   Dg Chest 2 View  11/07/2011  *RADIOLOGY REPORT*  Clinical Data:  Chest pain and left shoulder pain.  History of CHF, renal failure, and COPD.  CHEST - 2 VIEW  Comparison: Two-view chest x-ray 10/27/2011, 10/08/2028, 09/15/2011, 07/31/2011, 06/16/2011, 03/29/2010.  Findings: Cardiac silhouette moderately enlarged but stable. Thoracic aorta mildly tortuous, unchanged.  Hilar and mediastinal contours otherwise unremarkable.  Lungs clear.  Bronchovascular markings normal.  No pleural effusions.  Degenerative changes throughout the thoracic spine.  No significant interval change.  IMPRESSION: Stable cardiomegaly.  No acute cardiopulmonary disease.  Original Report Authenticated By: Arnell Sieving, M.D.   Dg Shoulder Left  11/07/2011  *RADIOLOGY REPORT*  Clinical Data: Chest pain and left shoulder pain.  No known  injury.  LEFT SHOULDER - 2+ VIEW  Comparison: Left shoulder x-rays 06/04/2011.  Findings: Interval development of a shoulder joint effusion and calcified loose bodies within the joint.  No evidence of acute fracture or glenohumeral dislocation.  Limited range of motion suspected, as both AP images were obtained with the shoulder internally rotated.  Acromioclavicular joint intact with moderate degenerative changes.  Well-preserved bone mineral density.  IMPRESSION: No acute osseous abnormality.  Joint effusion and calcified loose bodies within the joint.  Degenerative changes involving the acromioclavicular joint.  Original Report Authenticated By: Arnell Sieving, M.D.     1. Suicidal ideations   2. CHEST PAIN   3. Shoulder pain       MDM  Chest pain and shoulder pain. Her shoulder pain is secondary to calcified fragments in her point. She'll require orthopedic followup for this. I have no concern about a malignant cause of her chest pain. I have no concern about ACS as a cause of her pain. She's had a significant workup for this chest pain in the past. It is reproducible on palpation. I have no concern about pulmonary embolus. She expressed suicidal  ideation therefore she warrants evaluation by psychiatry. I will await further recommendations from the ACT team regarding her disposition. Temporary psychiatric orders were placed as well as her home medications.        Dayton Bailiff, MD 11/07/11 2036

## 2011-11-07 NOTE — BH Assessment (Signed)
Assessment Note   [name] is an 61 y.o. female.   Patient is a 61 year old white female with a history of mental illness.  Patient reports previous psychiatric hospitalizations due to hearing voices telling her to hurt herself.  Patient reports that the voices are telling her to take a butcher knife and kill herself.  Patient reports that she called 911 and told them about the voices.  Patient was brought to the ER by EMS.  Patient reports being diagnosed with Schizophrenia when she was 61 years old.   Patient was not able to remember the names and dates of her previous hospitalizations, medication management and outpatient therapy.  Patient denies current HI.  Patient reported past feelings of HI towards family members.  Patient denies any substance abuse.     Axis I: Schizoaffective Disorder Axis II: Deferred Axis III:  Past Medical History  Diagnosis Date  . Schizoaffective disorder   . Urinary tract infection   . Hypertension   . Atrial fibrillation   . GERD (gastroesophageal reflux disease)   . Hypothyroidism   . Myocardial infarction   . Diabetes mellitus   . Obesity   . Asthma   . COPD (chronic obstructive pulmonary disease)   . Pneumonia   . CHF (congestive heart failure)    Axis IV: economic problems, other psychosocial or environmental problems, problems related to social environment and problems with primary support group Axis V: 11-20 some danger of hurting self or others possible OR occasionally fails to maintain minimal personal hygiene OR gross impairment in communication  Past Medical History:  Past Medical History  Diagnosis Date  . Schizoaffective disorder   . Urinary tract infection   . Hypertension   . Atrial fibrillation   . GERD (gastroesophageal reflux disease)   . Hypothyroidism   . Myocardial infarction   . Diabetes mellitus   . Obesity   . Asthma   . COPD (chronic obstructive pulmonary disease)   . Pneumonia   . CHF (congestive heart  failure)     Past Surgical History  Procedure Date  . Tubal ligation   . Abdominal hysterectomy     Family History: No family history on file.  Social History:  reports that she has quit smoking. Her smokeless tobacco use includes Snuff and Chew. She reports that she does not drink alcohol or use illicit drugs.  Additional Social History:    Allergies:  Allergies  Allergen Reactions  . Sulfamethoxazole W-Trimethoprim Hives and Nausea And Vomiting  . Penicillins Hives    Home Medications:  (Not in a hospital admission)  OB/GYN Status:  No LMP recorded. Patient has had a hysterectomy.                                                      Disposition: Pending placement at [facility] and [facility].     On Site Evaluation by:   Reviewed with Physician:     Phillip Heal [name] 11/07/2011 9:44 PM

## 2011-11-07 NOTE — ED Notes (Signed)
Pt called police for suicidal ideations and then when police got there started complaining of chest pains bilaterally.     Pt has had 324mg  asa, one nitro and NSL.  No relief.

## 2011-11-07 NOTE — ED Notes (Addendum)
Note sent to pharmacy to send Norvasc, Flonase, Synthroid, and Naprosyn.

## 2011-11-07 NOTE — ED Notes (Signed)
Notified pharmacy for 1900 scheduled medications

## 2011-11-07 NOTE — ED Notes (Signed)
Sent note to pharmacy to send missing meds.

## 2011-11-08 NOTE — ED Notes (Signed)
Pt continuously asking what is going to happen. Informed we are waiting on placement. Pt begins to cry and mumble she doesn't want to go somewhere else, she wants to stay here or go home. Informed pt the doctor didn't find anything medical to keep her admitted here at [facility]. Also informed patient that we would find a hospital bed to put in her room while she is still here.

## 2011-11-08 NOTE — ED Provider Notes (Addendum)
Patient reevaluated this morning.  She is intermittently saying she is suicidal thoughts.  Patient wants to be admitted to get help with her medications.  She seems insistent that [facility] even though she understands that we do not do psychiatric admissions here at [facility].  I believe based on the patient's very flattened affect and depressed mood and known history of schizophrenia patient would benefit from psychiatric admission.  Patient also endorses that she hears voices.  If necessary I would proceed with IVC for this patient.  Nat Christen, MD 11/08/11 [phone]  Pt accepted to [facility] for psych care.    Nat Christen, MD 11/08/11 [phone]

## 2011-11-08 NOTE — BH Assessment (Signed)
Assessment Note   [name] is an 61 y.o. female.  Patient is a 61 year old white female with a history of mental illness. Patient reports previous psychiatric hospitalizations due to hearing voices telling her to hurt herself. Patient reports that the voices are telling her to take a butcher knife and kill herself. Patient reports that she called 911 and told them about the voices. Patient was brought to the ER by EMS. Patient reports being diagnosed with Schizophrenia when she was 61 years old. Patient was not able to remember the names and dates of her previous hospitalizations, medication management and outpatient therapy. Patient denies current HI. Patient reported past feelings of HI towards family members. Patient denies any substance abuse.   Axis I: Schizoaffective Disorder Axis II: Deferred Axis III:  Past Medical History  Diagnosis Date  . Schizoaffective disorder   . Urinary tract infection   . Hypertension   . Atrial fibrillation   . GERD (gastroesophageal reflux disease)   . Hypothyroidism   . Myocardial infarction   . Diabetes mellitus   . Obesity   . Asthma   . COPD (chronic obstructive pulmonary disease)   . Pneumonia   . CHF (congestive heart failure)    Axis IV: other psychosocial or environmental problems and problems with primary support group Axis V: 11-20 some danger of hurting self or others possible OR occasionally fails to maintain minimal personal hygiene OR gross impairment in communication  Past Medical History:  Past Medical History  Diagnosis Date  . Schizoaffective disorder   . Urinary tract infection   . Hypertension   . Atrial fibrillation   . GERD (gastroesophageal reflux disease)   . Hypothyroidism   . Myocardial infarction   . Diabetes mellitus   . Obesity   . Asthma   . COPD (chronic obstructive pulmonary disease)   . Pneumonia   . CHF (congestive heart failure)     Past Surgical History  Procedure Date  . Tubal ligation   .  Abdominal hysterectomy     Family History: No family history on file.  Social History:  reports that she has quit smoking. Her smokeless tobacco use includes Snuff and Chew. She reports that she does not drink alcohol or use illicit drugs.  Additional Social History:    Allergies:  Allergies  Allergen Reactions  . Sulfamethoxazole W-Trimethoprim Hives and Nausea And Vomiting  . Penicillins Hives    Home Medications:  (Not in a hospital admission)  OB/GYN Status:  No LMP recorded. Patient has had a hysterectomy.  General Assessment Data Location of Assessment: [facility] Assessment Services ACT Assessment: Yes Living Arrangements: Spouse/significant other (fiance') Can pt return to current living arrangement?: Yes Admission Status: Voluntary Is patient capable of signing voluntary admission?: Yes Transfer from: Home Referral Source: Self/Family/Friend     Risk to self Suicidal Ideation: Yes-Currently Present Suicidal Intent: Yes-Currently Present Is patient at risk for suicide?: No Suicidal Plan?: No-Not Currently/Within Last 6 Months Access to Means: No What has been your use of drugs/alcohol within the last 12 months?: N/A Previous Attempts/Gestures: Yes How many times?: 2  Other Self Harm Risks: Cutting Triggers for Past Attempts: Hallucinations;Family contact Intentional Self Injurious Behavior: Cutting Comment - Self Injurious Behavior: Pt reports that she has not cut herself since the early 1980's Family Suicide History: No Recent stressful life event(s): Conflict (Comment);Financial Problems;Trauma (Comment) Persecutory voices/beliefs?: Yes Depression: Yes Depression Symptoms: Despondent;Insomnia;Tearfulness;Isolating;Fatigue;Guilt;Loss of interest in usual pleasures;Feeling worthless/self pity Substance abuse history and/or  treatment for substance abuse?: No Suicide prevention information given to non-admitted patients: Yes  Risk to Others Current Homicidal Plan:  No Access to Homicidal Means: No Identified Victim: N/A History of harm to others?: No Assessment of Violence: None Noted Violent Behavior Description: none reported Does patient have access to weapons?: No Criminal Charges Pending?: No Does patient have a court date: No  Psychosis Hallucinations: Auditory;Visual;With command (voices are telling her to use a butcher knife to kill hersel) Delusions: None noted  Mental Status Report Appear/Hygiene: Disheveled Eye Contact: Fair Motor Activity: Restlessness Speech: Logical/coherent Level of Consciousness: Quiet/awake;Alert Mood: Depressed;Helpless;Sad;Worthless, low self-esteem Affect: Depressed;Sullen Anxiety Level: None Thought Processes: Coherent;Relevant Judgement: Unimpaired Orientation: Person;Place;Time;Situation Obsessive Compulsive Thoughts/Behaviors: None  Cognitive Functioning Concentration: Decreased Memory: Recent Intact;Remote Intact IQ: Average Insight: Fair Impulse Control: Fair Appetite: Fair Weight Loss: 0  Weight Gain: 0  Sleep: Decreased Total Hours of Sleep: 5  Vegetative Symptoms: None  Prior Inpatient Therapy Prior Inpatient Therapy: Yes Prior Therapy Dates: unable to remember Prior Therapy Facilty/Provider(s): unable to remember Reason for Treatment: SI  Prior Outpatient Therapy Prior Outpatient Therapy: Yes Prior Therapy Dates: unable to remember Prior Therapy Facilty/Provider(s): unable to remember Reason for Treatment: Depression and SI                     Additional Information 1:1 In Past 12 Months?: No CIRT Risk: No Elopement Risk: No Does patient have medical clearance?: Yes     Disposition:  Disposition Disposition of Patient: Referred to Patient referred to: Other (Comment) ([facility] and [facility] )  On Site Evaluation by:   Reviewed with Physician:     [name] Ray 11/08/2011 5:50 AM

## 2011-11-08 NOTE — ED Notes (Signed)
Pt denies having any more suicidal thoughts and wants to go home and see her husband.

## 2011-11-08 NOTE — ED Notes (Signed)
[employer] served [employer].

## 2011-11-08 NOTE — ED Notes (Signed)
Pt belongings given to [facility].  Pt transported to [facility] with [employer].

## 2011-11-08 NOTE — BH Assessment (Signed)
Assessment Note   Update:  Received call from [facility] requesting current labs and these faxed over.  IVC paperwork obtained from [facility], as pt crying, stating she wanted to go home.  However, pt was still endorsing SI, so pt made IVC for her safety.  Received call from [place] at [place] [facility] stating pt accepted to Dr. Prudencio Pair and that pt could be transported.  Updated assessment disposition, completed assessment notification and faxed to [facility] to log.  Updated [facility] and ED staff.  ED staff to arrange transport via [facility], as pt is IVC.     Disposition:  Disposition Disposition of Patient: Inpatient treatment program Type of inpatient treatment program: Adult Patient referred to: Other (Comment) (Pt accepted [facility])  On Site Evaluation by:   Reviewed with Physician:  Samson Frederic 11/08/2011 3:43 PM

## 2011-11-08 NOTE — ED Notes (Signed)
Pt made aware her boyfriend stopped by and she was sleeping. He informed this nurse he would be back by around 1230. Pt verbalized understanding and remains more calm at this time.

## 2011-11-22 ENCOUNTER — Emergency Department (HOSPITAL_COMMUNITY)
Admission: EM | Admit: 2011-11-22 | Discharge: 2011-11-22 | Disposition: A | Discharge status: Home / Self Care | Payer: Medicare Other | Attending: Emergency Medicine | Admitting: Emergency Medicine

## 2011-11-22 ENCOUNTER — Emergency Department (HOSPITAL_COMMUNITY): Payer: Medicare Other

## 2011-11-22 DIAGNOSIS — R079 Chest pain, unspecified: Secondary | ICD-10-CM | POA: Insufficient documentation

## 2011-11-22 DIAGNOSIS — E119 Type 2 diabetes mellitus without complications: Secondary | ICD-10-CM | POA: Insufficient documentation

## 2011-11-22 DIAGNOSIS — Z8659 Personal history of other mental and behavioral disorders: Secondary | ICD-10-CM | POA: Insufficient documentation

## 2011-11-22 DIAGNOSIS — I10 Essential (primary) hypertension: Secondary | ICD-10-CM | POA: Insufficient documentation

## 2011-11-22 DIAGNOSIS — I4891 Unspecified atrial fibrillation: Secondary | ICD-10-CM | POA: Insufficient documentation

## 2011-11-22 DIAGNOSIS — I252 Old myocardial infarction: Secondary | ICD-10-CM | POA: Insufficient documentation

## 2011-11-22 DIAGNOSIS — R5383 Other fatigue: Secondary | ICD-10-CM | POA: Insufficient documentation

## 2011-11-22 DIAGNOSIS — R5381 Other malaise: Secondary | ICD-10-CM | POA: Insufficient documentation

## 2011-11-22 DIAGNOSIS — E039 Hypothyroidism, unspecified: Secondary | ICD-10-CM | POA: Insufficient documentation

## 2011-11-22 DIAGNOSIS — J4489 Other specified chronic obstructive pulmonary disease: Secondary | ICD-10-CM | POA: Insufficient documentation

## 2011-11-22 DIAGNOSIS — J069 Acute upper respiratory infection, unspecified: Secondary | ICD-10-CM | POA: Insufficient documentation

## 2011-11-22 DIAGNOSIS — I509 Heart failure, unspecified: Secondary | ICD-10-CM | POA: Insufficient documentation

## 2011-11-22 DIAGNOSIS — J449 Chronic obstructive pulmonary disease, unspecified: Secondary | ICD-10-CM | POA: Insufficient documentation

## 2011-11-22 DIAGNOSIS — J45909 Unspecified asthma, uncomplicated: Secondary | ICD-10-CM | POA: Insufficient documentation

## 2011-11-22 DIAGNOSIS — Z79899 Other long term (current) drug therapy: Secondary | ICD-10-CM | POA: Insufficient documentation

## 2011-11-22 DIAGNOSIS — R0602 Shortness of breath: Secondary | ICD-10-CM | POA: Insufficient documentation

## 2011-11-22 LAB — BASIC METABOLIC PANEL
BUN: 12 mg/dL (ref 6–23)
CO2: 30 mEq/L (ref 19–32)
GFR calc non Af Amer: 80 mL/min — ABNORMAL LOW (ref >90)

## 2011-11-22 LAB — DIFFERENTIAL
Basophils Absolute: 0 10*3/uL (ref 0.0–0.1)
Basophils Relative: 0 % (ref 0–1)
Eosinophils Relative: 1 % (ref 0–5)
Monocytes Absolute: 0.5 10*3/uL (ref 0.1–1.0)

## 2011-11-22 LAB — URINALYSIS, ROUTINE W REFLEX MICROSCOPIC
Bilirubin Urine: NEGATIVE
Ketones, ur: NEGATIVE mg/dL
Leukocytes, UA: NEGATIVE
Nitrite: NEGATIVE
Protein, ur: NEGATIVE mg/dL
Urobilinogen, UA: 0.2 mg/dL (ref 0.0–1.0)

## 2011-11-22 LAB — CBC
HCT: 35.1 % — ABNORMAL LOW (ref 36.0–46.0)
MCH: 33.4 pg (ref 26.0–34.0)
MCH: 33.7 pg (ref 26.0–34.0)
MCHC: 34.5 g/dL (ref 30.0–36.0)
MCHC: 34.2 g/dL (ref 30.0–36.0)
MCV: 97 fL (ref 78.0–100.0)
MCV: 98.6 fL (ref 78.0–100.0)
Platelets: 151 10*3/uL (ref 150–400)
RDW: 12.4 % (ref 11.5–15.5)
RDW: 12.5 % (ref 11.5–15.5)

## 2011-11-22 LAB — POCT I-STAT, CHEM 8
Calcium, Ion: 1.26 mmol/L (ref 1.12–1.32)
Creatinine, Ser: 0.9 mg/dL (ref 0.50–1.10)
Glucose, Bld: 107 mg/dL — ABNORMAL HIGH (ref 70–99)
HCT: 38 % (ref 36.0–46.0)
Hemoglobin: 12.9 g/dL (ref 12.0–15.0)

## 2011-11-22 LAB — RAPID URINE DRUG SCREEN, HOSP PERFORMED
Amphetamines: NOT DETECTED
Benzodiazepines: NOT DETECTED
Tetrahydrocannabinol: NOT DETECTED

## 2011-11-22 LAB — POCT I-STAT TROPONIN I

## 2011-11-22 MED ORDER — ALBUTEROL SULFATE (5 MG/ML) 0.5% IN NEBU
5.0000 mg | INHALATION_SOLUTION | Freq: Once | RESPIRATORY_TRACT | Status: AC
  Administered 2011-11-22: 5 mg via RESPIRATORY_TRACT
  Filled 2011-11-22: qty 1

## 2011-11-22 MED ORDER — ALBUTEROL SULFATE (5 MG/ML) 0.5% IN NEBU: 5.0000 mg | INHALATION_SOLUTION | Freq: Once | RESPIRATORY_TRACT | Status: DC

## 2011-11-22 MED ORDER — MORPHINE SULFATE 4 MG/ML IJ SOLN
4.0000 mg | Freq: Once | INTRAMUSCULAR | Status: AC
  Administered 2011-11-22: 4 mg via INTRAVENOUS
  Filled 2011-11-22: qty 1

## 2011-11-22 MED ORDER — ONDANSETRON HCL 4 MG/2ML IJ SOLN
4.0000 mg | Freq: Once | INTRAMUSCULAR | Status: AC
  Administered 2011-11-22: 4 mg via INTRAVENOUS
  Filled 2011-11-22: qty 2

## 2011-11-22 MED ORDER — SODIUM CHLORIDE 0.9 % IV SOLN: INTRAVENOUS | Status: DC

## 2011-11-22 MED ORDER — ALBUTEROL SULFATE HFA 108 (90 BASE) MCG/ACT IN AERS
2.0000 | INHALATION_SPRAY | RESPIRATORY_TRACT | Status: DC | PRN
  Administered 2011-11-22: 2 via RESPIRATORY_TRACT
  Filled 2011-11-22: qty 6.7

## 2011-11-22 MED ORDER — AEROCHAMBER Z-STAT PLUS/MEDIUM MISC
1.0000 | Freq: Once | Status: DC
Start: 2011-11-22 — End: 2011-11-22

## 2011-11-22 MED ORDER — PREDNISONE 20 MG PO TABS
60.0000 mg | ORAL_TABLET | Freq: Once | ORAL | Status: AC
  Administered 2011-11-22: 60 mg via ORAL
  Filled 2011-11-22: qty 3

## 2011-11-22 MED ORDER — IPRATROPIUM BROMIDE 0.02 % IN SOLN
0.5000 mg | Freq: Once | RESPIRATORY_TRACT | Status: AC
  Administered 2011-11-22: 0.5 mg via RESPIRATORY_TRACT
  Filled 2011-11-22: qty 2.5

## 2011-11-22 MED ORDER — PREDNISONE 20 MG PO TABS: 60.0000 mg | ORAL_TABLET | Freq: Every day | ORAL | Status: AC

## 2011-11-22 NOTE — ED Provider Notes (Signed)
History     CSN: [id number]  Arrival date & time 11/22/11  4098   First MD Initiated Contact with Patient 11/22/11 [phone]      Chief Complaint  Patient presents with  . Chest Pain    normal EKG; no response to nitro sL    (Consider location/radiation/quality/duration/timing/severity/associated sxs/prior treatment) Patient is a 61 y.o. female presenting with chest pain. The history is provided by the patient.  Chest Pain The chest pain began 1 - 2 hours ago. Chest pain occurs constantly. The chest pain is unchanged. The quality of the pain is described as sharp. Pertinent negatives for primary symptoms include no fever, no cough, no abdominal pain and no nausea. Associated symptoms comments: Recurrent chest pain that started this morning. No fever, cough. Described initially as pressure, now as sharp and constant. No alleviating or aggravating factors..  Her past medical history is significant for CAD, COPD, CHF, diabetes, hypertension and thyroid problem.     Past Medical History  Diagnosis Date  . Schizoaffective disorder   . Urinary tract infection   . Hypertension   . Atrial fibrillation   . GERD (gastroesophageal reflux disease)   . Hypothyroidism   . Myocardial infarction   . Diabetes mellitus   . Obesity   . Asthma   . COPD (chronic obstructive pulmonary disease)   . Pneumonia   . CHF (congestive heart failure)     Past Surgical History  Procedure Date  . Tubal ligation   . Abdominal hysterectomy     No family history on file.  History  Substance Use Topics  . Smoking status: Former Smoker -- 0.0 packs/day  . Smokeless tobacco: Current User    Types: Snuff, Chew  . Alcohol Use: No    OB History    Grav Para Term Preterm Abortions TAB SAB Ect Mult Living                  Review of Systems  Constitutional: Negative for fever.  Respiratory: Negative for cough.   Cardiovascular: Positive for chest pain.  Gastrointestinal: Negative for nausea and  abdominal pain.    Allergies  Sulfamethoxazole w-trimethoprim and Penicillins  Home Medications   Current Outpatient Rx  Name Route Sig Dispense Refill  . ALBUTEROL SULFATE HFA 108 (90 BASE) MCG/ACT IN AERS Inhalation Inhale 2 puffs into the lungs every 6 (six) hours as needed. For wheezing    . AMLODIPINE BESYLATE 5 MG PO TABS Oral Take 5 mg by mouth daily.    . ASPIRIN EC 81 MG PO TBEC Oral Take 81 mg by mouth every morning.     Marland Kitchen CHLORPROMAZINE HCL 25 MG PO TABS Oral Take 25-50 mg by mouth 3 (three) times daily. 1 tablet twice a day and 2 additional tablets at bedtime    . DIVALPROEX SODIUM ER 500 MG PO TB24 Oral Take 1,000 mg by mouth 2 (two) times daily.    Marland Kitchen FLUTICASONE PROPIONATE 50 MCG/ACT NA SUSP Nasal Place 2 sprays into the nose daily as needed. For nasal congestion    . FOLIC ACID 1 MG PO TABS Oral Take 1 mg by mouth every morning.     Marland Kitchen LEVOTHYROXINE SODIUM 150 MCG PO TABS Oral Take 150 mcg by mouth daily.    Marland Kitchen LISINOPRIL 40 MG PO TABS Oral Take 40 mg by mouth daily.    Marland Kitchen METOPROLOL SUCCINATE ER 25 MG PO TB24 Oral Take 25 mg by mouth every morning.    Marland Kitchen NAPROXEN  500 MG PO TABS Oral Take 1 tablet (500 mg total) by mouth 2 (two) times daily with a meal. 30 tablet 0  . OMEPRAZOLE 20 MG PO CPDR Oral Take 20 mg by mouth daily.     Marland Kitchen PALIPERIDONE ER 6 MG PO TB24 Oral Take 6 mg by mouth 2 (two) times daily.    Marland Kitchen NITROGLYCERIN 0.4 MG SL SUBL Sublingual Place 0.4 mg under the tongue every 5 (five) minutes as needed. For chest pain given by EMS x 1 dose      BP 115/53  Pulse 84  Temp(Src) 97.9 F (36.6 C) (Oral)  Resp 22  SpO2 99%  Physical Exam  Constitutional: She is oriented to person, place, and time. She appears well-developed and well-nourished. No distress.  HENT:  Head: Normocephalic.  Mouth/Throat: Oropharynx is clear and moist.  Cardiovascular: Normal rate and regular rhythm.   No murmur heard. Pulmonary/Chest: Effort normal. She has no wheezes. She has no rales.    Abdominal: Soft. There is no tenderness.       Morbidly obese abdomen.  Musculoskeletal: Normal range of motion.  Neurological: She is alert and oriented to person, place, and time.  Skin: Skin is warm and dry.    ED Course  Procedures (including critical care time)  Labs Reviewed  CBC - Abnormal; Notable for the following:    RBC 3.62 (*)    HCT 35.1 (*)    All other components within normal limits  URINALYSIS, ROUTINE W REFLEX MICROSCOPIC - Abnormal; Notable for the following:    Color, Urine STRAW (*)    All other components within normal limits  POCT I-STAT, CHEM 8 - Abnormal; Notable for the following:    Sodium 132 (*)    Glucose, Bld 107 (*)    All other components within normal limits  URINE RAPID DRUG SCREEN (HOSP PERFORMED)  POCT I-STAT TROPONIN I   Results for orders placed during the hospital encounter of 11/22/11  CBC      Component Value Range   WBC 5.2  4.0 - 10.5 (K/uL)   RBC 3.62 (*) 3.87 - 5.11 (MIL/uL)   Hemoglobin 12.1  12.0 - 15.0 (g/dL)   HCT 16.1 (*) 09.6 - 46.0 (%)   MCV 97.0  78.0 - 100.0 (fL)   MCH 33.4  26.0 - 34.0 (pg)   MCHC 34.5  30.0 - 36.0 (g/dL)   RDW 04.5  40.9 - 81.1 (%)   Platelets 151  150 - 400 (K/uL)  URINALYSIS, ROUTINE W REFLEX MICROSCOPIC      Component Value Range   Color, Urine STRAW (*) YELLOW    APPearance CLEAR  CLEAR    Specific Gravity, Urine 1.005  1.005 - 1.030    pH 7.0  5.0 - 8.0    Glucose, UA NEGATIVE  NEGATIVE (mg/dL)   Hgb urine dipstick NEGATIVE  NEGATIVE    Bilirubin Urine NEGATIVE  NEGATIVE    Ketones, ur NEGATIVE  NEGATIVE (mg/dL)   Protein, ur NEGATIVE  NEGATIVE (mg/dL)   Urobilinogen, UA 0.2  0.0 - 1.0 (mg/dL)   Nitrite NEGATIVE  NEGATIVE    Leukocytes, UA NEGATIVE  NEGATIVE   URINE RAPID DRUG SCREEN (HOSP PERFORMED)      Component Value Range   Opiates NONE DETECTED  NONE DETECTED    Cocaine NONE DETECTED  NONE DETECTED    Benzodiazepines NONE DETECTED  NONE DETECTED    Amphetamines NONE DETECTED   NONE DETECTED    Tetrahydrocannabinol NONE  DETECTED  NONE DETECTED    Barbiturates NONE DETECTED  NONE DETECTED   POCT I-STAT, CHEM 8      Component Value Range   Sodium 132 (*) 135 - 145 (mEq/L)   Potassium 4.2  3.5 - 5.1 (mEq/L)   Chloride 96  96 - 112 (mEq/L)   BUN 17  6 - 23 (mg/dL)   Creatinine, Ser 4.09  0.50 - 1.10 (mg/dL)   Glucose, Bld 811 (*) 70 - 99 (mg/dL)   Calcium, Ion 9.14  7.82 - 1.32 (mmol/L)   TCO2 27  0 - 100 (mmol/L)   Hemoglobin 12.9  12.0 - 15.0 (g/dL)   HCT 95.6  21.3 - 08.6 (%)  POCT I-STAT TROPONIN I      Component Value Range   Troponin i, poc 0.00  0.00 - 0.08 (ng/mL)   Comment 3             Dg Chest Portable 1 View  11/22/2011  *RADIOLOGY REPORT*  Clinical Data: Chest pain.  PORTABLE CHEST - 1 VIEW  Comparison: 11/07/2011.  Findings: The heart is mildly enlarged but stable.  There is tortuosity of the thoracic aorta.  The lungs are clear.  No pleural effusion.  The bony thorax is intact.  IMPRESSION: Mild stable cardiac enlargement.  No acute pulmonary findings.  Original Report Authenticated By: P. Loralie Champagne, M.D.     No diagnosis found. 1. Chest pain   MDM  Chest evaluation shows no evidence of acute event. No evidence of pneumonia or other pulmonary process. She can be discharged home and should follow up with Dr. Sharyn Lull.        Rodena Medin, PA-C 11/22/11 [phone]

## 2011-11-22 NOTE — ED Notes (Signed)
The patient states that she was awake when she first felt her chest pain.  She states it was an "8/10 pressure in the middle" of her chest.  Upon arrival, EMS gave nitro-stat 0.4 mg, sL x1, but the patient did not get any relief.  The patient's chest pain is not reproducible.

## 2011-11-22 NOTE — Discharge Instructions (Signed)
YOUR EVALUATION DOES NOT SHOW ANY NEW HEART PROBLEM. YOU CAN BE DISCHARGED HOME AND SHOULD FOLLOW UP WITH DR. Sharyn Lull OR YOUR PRIMARY CARE DOCTOR FOR RECHECK THIS WEEK.

## 2011-11-22 NOTE — ED Notes (Signed)
Pt was d/c from [facility] earlier to day. Pt sts she was unable to get her d/c meds filled because "they kept them" -- pt used handheld MDI albuterol x1 this am. No distress noted on arrival. Albuterol neb given by ems pta

## 2011-11-22 NOTE — ED Notes (Signed)
Assumed care of pt. Pt up for d/c. Waiting for paperwork to d/c pt.

## 2011-11-22 NOTE — Discharge Instructions (Signed)
Asthma, Adult Asthma is caused by narrowing of the air passages in the lungs. It may be triggered by pollen, dust, animal dander, molds, some foods, respiratory infections, exposure to smoke, exercise, emotional stress or other allergens (things that cause allergic reactions or allergies). Repeat attacks are common. HOME CARE INSTRUCTIONS   Use prescription medications as ordered by your caregiver.   Avoid pollen, dust, animal dander, molds, smoke and other things that cause attacks at home and at work.   You may have fewer attacks if you decrease dust in your home. Electrostatic air cleaners may help.   It may help to replace your pillows or mattress with materials less likely to cause allergies.   Talk to your caregiver about an action plan for managing asthma attacks at home, including, the use of a peak flow meter which measures the severity of your asthma attack. An action plan can help minimize or stop the attack without having to seek medical care.   If you are not on a fluid restriction, drink 8 to 10 glasses of water each day.   Always have a plan prepared for seeking medical attention, including, calling your physician, accessing local emergency care, and calling 911 (in the [place] for a severe attack.   Discuss possible exercise routines with your caregiver.   If animal dander is the cause of asthma, you may need to get rid of pets.  SEEK MEDICAL CARE IF:   You have wheezing and shortness of breath even if taking medicine to prevent attacks.   You have muscle aches, chest pain or thickening of sputum.   Your sputum changes from clear or white to yellow, green, gray, or bloody.   You have any problems that may be related to the medicine you are taking (such as a rash, itching, swelling or trouble breathing).  SEEK IMMEDIATE MEDICAL CARE IF:   Your usual medicines do not stop your wheezing or there is increased coughing and/or shortness of breath.   You have increased  difficulty breathing.   You have a fever.  MAKE SURE YOU:   Understand these instructions.   Will watch your condition.   Will get help right away if you are not doing well or get worse.  Document Released: 06/06/2005 Document Revised: 05/26/2011 Document Reviewed: 01/23/2008 [facility] Patient Information 2012 [place], [place].  Prednisone will temporarily increase your blood sugar. Please check your blood sugar more frequently and adjust your dosing accordingly. Please followup with her primary care physician to discuss the need for CPAP at home

## 2011-11-22 NOTE — ED Notes (Signed)
ZOX:[id number]<AV> Expected date:11/22/11<BR> Expected time:<BR> Means of arrival:<BR> Comments:<BR> EMS 60 GC - sob

## 2011-11-22 NOTE — ED Provider Notes (Signed)
History     CSN: [id number]  Arrival date & time 11/22/11  1259   First MD Initiated Contact with Patient 11/22/11 1319      Chief Complaint  Patient presents with  . Wheezing    (Consider location/radiation/quality/duration/timing/severity/associated sxs/prior treatment) HPI Comments: Patient was seen at [facility] earlier today and was discharged. She was evaluated for chest pain. A negative workup. Presents shortness of breath. Received an albuterol treatment prior to arrival  Patient is a 61 y.o. female presenting with wheezing. The history is provided by the patient. No language interpreter was used.  Wheezing  The current episode started today. The onset was gradual. The problem occurs continuously. The problem has been gradually worsening. The problem is moderate. The symptoms are relieved by nothing. The symptoms are aggravated by activity. Associated symptoms include cough, shortness of breath and wheezing. Pertinent negatives include no chest pain, no chest pressure, no fever, no rhinorrhea and no sore throat. The cough has no precipitants. The cough is dry.    Past Medical History  Diagnosis Date  . Schizoaffective disorder   . Urinary tract infection   . Hypertension   . Atrial fibrillation   . GERD (gastroesophageal reflux disease)   . Hypothyroidism   . Myocardial infarction   . Diabetes mellitus   . Obesity   . Asthma   . COPD (chronic obstructive pulmonary disease)   . Pneumonia   . CHF (congestive heart failure)     Past Surgical History  Procedure Date  . Tubal ligation   . Abdominal hysterectomy     No family history on file.  History  Substance Use Topics  . Smoking status: Former Smoker -- 0.0 packs/day  . Smokeless tobacco: Current User    Types: Snuff, Chew  . Alcohol Use: No    OB History    Grav Para Term Preterm Abortions TAB SAB Ect Mult Living                  Review of Systems  Constitutional: Positive for chills and fatigue.  Negative for fever, activity change and appetite change.  HENT: Negative for congestion, sore throat, rhinorrhea, neck pain and neck stiffness.   Respiratory: Positive for cough, shortness of breath and wheezing.   Cardiovascular: Negative for chest pain and palpitations.  Gastrointestinal: Negative for nausea, vomiting and abdominal pain.  Genitourinary: Negative for dysuria, urgency, frequency and flank pain.  Musculoskeletal: Negative for myalgias, back pain and arthralgias.  Neurological: Negative for dizziness, weakness, light-headedness, numbness and headaches.  All other systems reviewed and are negative.    Allergies  Sulfamethoxazole w-trimethoprim and Penicillins  Home Medications   Current Outpatient Rx  Name Route Sig Dispense Refill  . ALBUTEROL SULFATE HFA 108 (90 BASE) MCG/ACT IN AERS Inhalation Inhale 2 puffs into the lungs every 6 (six) hours as needed. For wheezing    . AMLODIPINE BESYLATE 5 MG PO TABS Oral Take 5 mg by mouth daily.    . ASPIRIN EC 81 MG PO TBEC Oral Take 81 mg by mouth every morning.     Marland Kitchen CHLORPROMAZINE HCL 25 MG PO TABS Oral Take 25-50 mg by mouth 3 (three) times daily. 1 tablet twice a day and 2 additional tablets at bedtime    . DIVALPROEX SODIUM ER 500 MG PO TB24 Oral Take 1,000 mg by mouth 2 (two) times daily.    Marland Kitchen FLUTICASONE PROPIONATE 50 MCG/ACT NA SUSP Nasal Place 2 sprays into the nose daily as needed.  For nasal congestion    . FOLIC ACID 1 MG PO TABS Oral Take 1 mg by mouth every morning.     Marland Kitchen LEVOTHYROXINE SODIUM 150 MCG PO TABS Oral Take 150 mcg by mouth daily.    Marland Kitchen LISINOPRIL 40 MG PO TABS Oral Take 40 mg by mouth daily.    Marland Kitchen METOPROLOL SUCCINATE ER 25 MG PO TB24 Oral Take 25 mg by mouth every morning.    Marland Kitchen NAPROXEN 500 MG PO TABS Oral Take 1 tablet (500 mg total) by mouth 2 (two) times daily with a meal. 30 tablet 0  . NITROGLYCERIN 0.4 MG SL SUBL Sublingual Place 0.4 mg under the tongue every 5 (five) minutes as needed. For chest  pain given by EMS x 1 dose    . OMEPRAZOLE 20 MG PO CPDR Oral Take 20 mg by mouth daily.     Marland Kitchen PALIPERIDONE ER 6 MG PO TB24 Oral Take 6 mg by mouth 2 (two) times daily.    Marland Kitchen PREDNISONE 20 MG PO TABS Oral Take 3 tablets (60 mg total) by mouth daily. 15 tablet 0    BP 101/52  Pulse 79  Temp(Src) 97.9 F (36.6 C) (Oral)  Resp 45  Ht 5\' 1"  (1.549 m)  Wt 324 lb (146.965 kg)  BMI 61.22 kg/m2  SpO2 97%  Physical Exam  Nursing note and vitals reviewed. Constitutional: She is oriented to person, place, and time. She appears well-developed and well-nourished. No distress.  HENT:  Head: Normocephalic and atraumatic.  Mouth/Throat: Oropharynx is clear and moist.  Eyes: Conjunctivae and EOM are normal. Pupils are equal, round, and reactive to light.  Neck: Normal range of motion. Neck supple.  Cardiovascular: Normal rate, regular rhythm, normal heart sounds and intact distal pulses.  Exam reveals no gallop and no friction rub.   No murmur heard. Pulmonary/Chest: She is in respiratory distress. She has wheezes. She exhibits no tenderness.  Abdominal: Soft. Bowel sounds are normal. There is no tenderness. There is no rebound and no guarding.       Morbidly obese  Musculoskeletal: Normal range of motion. She exhibits no edema and no tenderness.  Neurological: She is alert and oriented to person, place, and time. No cranial nerve deficit.  Skin: Skin is warm and dry.    ED Course  Procedures (including critical care time)  Labs Reviewed  CBC - Abnormal; Notable for the following:    RBC 3.56 (*)    HCT 35.1 (*)    All other components within normal limits  DIFFERENTIAL - Abnormal; Notable for the following:    Neutrophils Relative 37 (*)    Lymphocytes Relative 52 (*)    All other components within normal limits  BASIC METABOLIC PANEL - Abnormal; Notable for the following:    Sodium 131 (*)    Glucose, Bld 122 (*)    GFR calc non Af Amer 80 (*)    All other components within normal  limits  POCT I-STAT TROPONIN I   Dg Chest 2 View  11/22/2011  *RADIOLOGY REPORT*  Clinical Data: Short of breath  CHEST - 2 VIEW  Comparison: 11/22/2011  Findings: Heart is mildly enlarged.  Lungs are clear.  Pulmonary vascularity is within normal limits.  IMPRESSION: Cardiomegaly without edema.  Original Report Authenticated By: Donavan Burnet, M.D.   Dg Chest Portable 1 View  11/22/2011  *RADIOLOGY REPORT*  Clinical Data: Chest pain.  PORTABLE CHEST - 1 VIEW  Comparison: 11/07/2011.  Findings: The heart  is mildly enlarged but stable.  There is tortuosity of the thoracic aorta.  The lungs are clear.  No pleural effusion.  The bony thorax is intact.  IMPRESSION: Mild stable cardiac enlargement.  No acute pulmonary findings.  Original Report Authenticated By: P. Loralie Champagne, M.D.     1. ASTHMA   2. URI (upper respiratory infection)       MDM  Asthma exacerbation likely secondary to a URI. She received a breathing treatment with improvement of her symptoms. She received a dose of prednisone. Laboratory studies are unremarkable. I have no concern about a malignant cause of her symptoms. She has no evidence of pneumonia. No concern for cardiac etiology. She'll be discharged home with a prednisone burst and albuterol inhaler.        Dayton Bailiff, MD 11/22/11 [phone]

## 2011-11-23 NOTE — ED Provider Notes (Signed)
Medical screening examination/treatment/procedure(s) were performed by non-physician practitioner and as supervising physician I was immediately available for consultation/collaboration.  Sunnie Nielsen, MD 11/23/11 [phone]

## 2011-11-29 IMAGING — CR DG LUMBAR SPINE COMPLETE 4+V
5 series · 5 of 5 positions shown · non-contrast
Comparison: CT 11/06/2007

CLINICAL DATA: Pain post fall

LUMBAR SPINE - COMPLETE 4+ VIEW

[t l-spine a.p.]
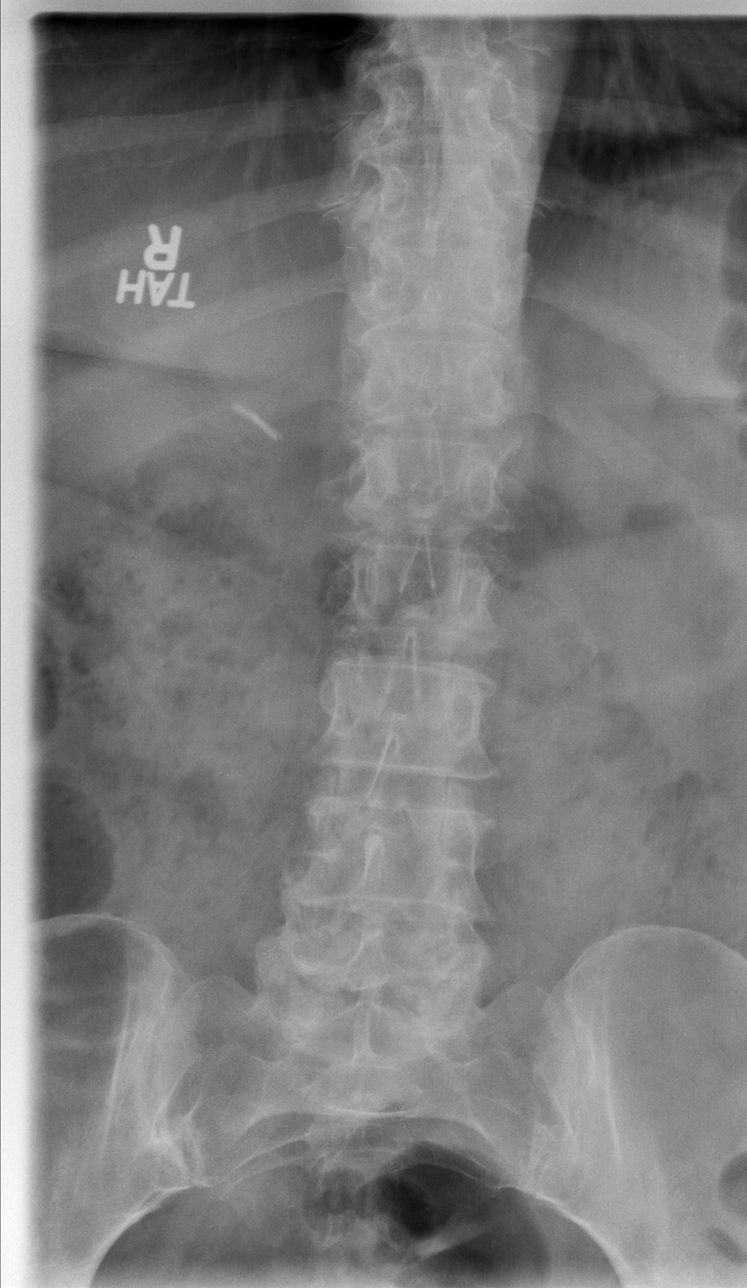

[t l-spine oblique exposure (1 of 2)]
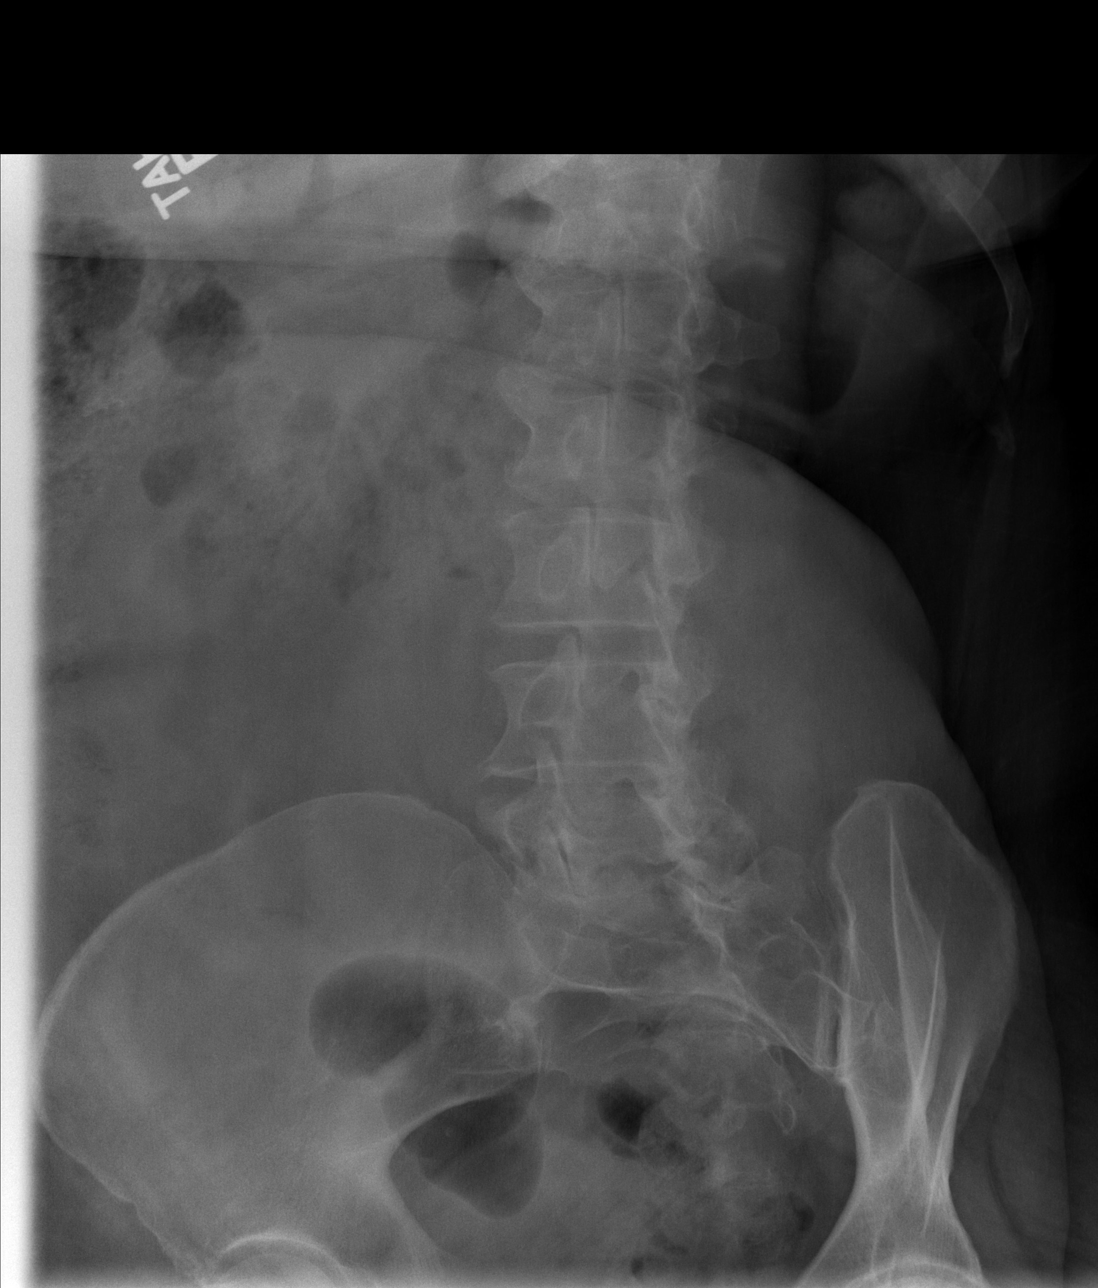

[t l-spine oblique exposure (2 of 2)]
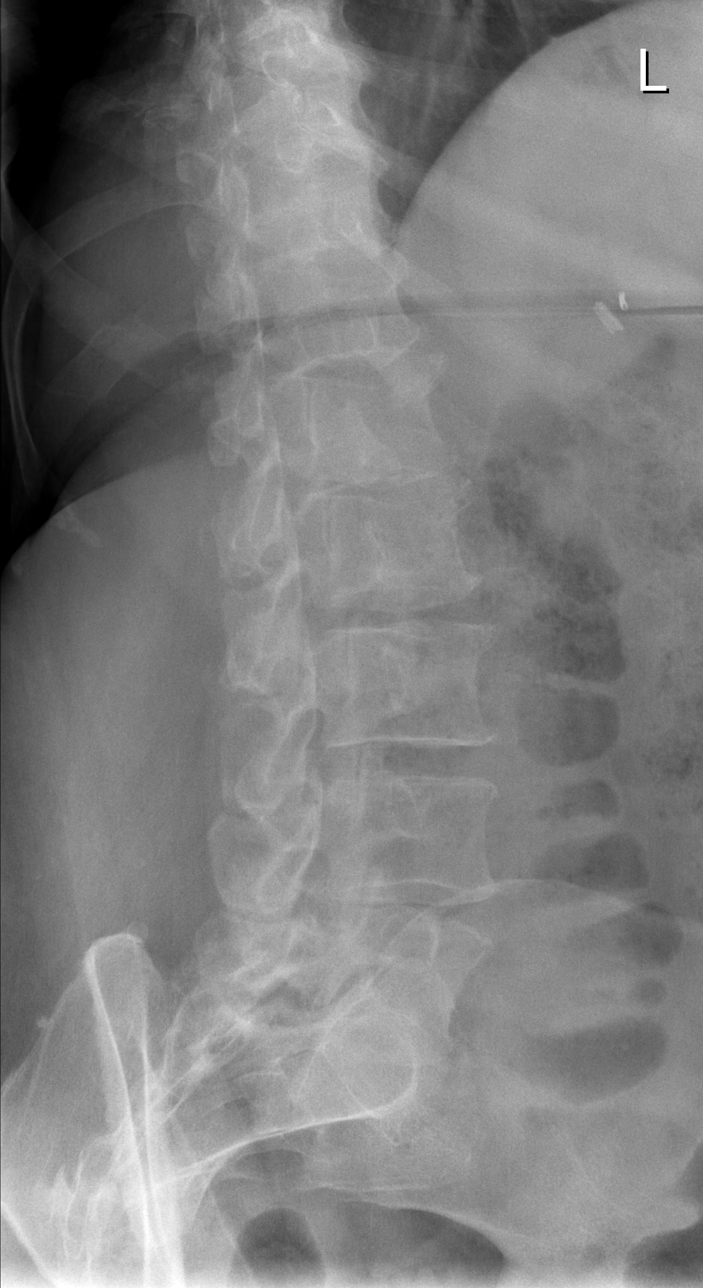

[t l-spine lat]
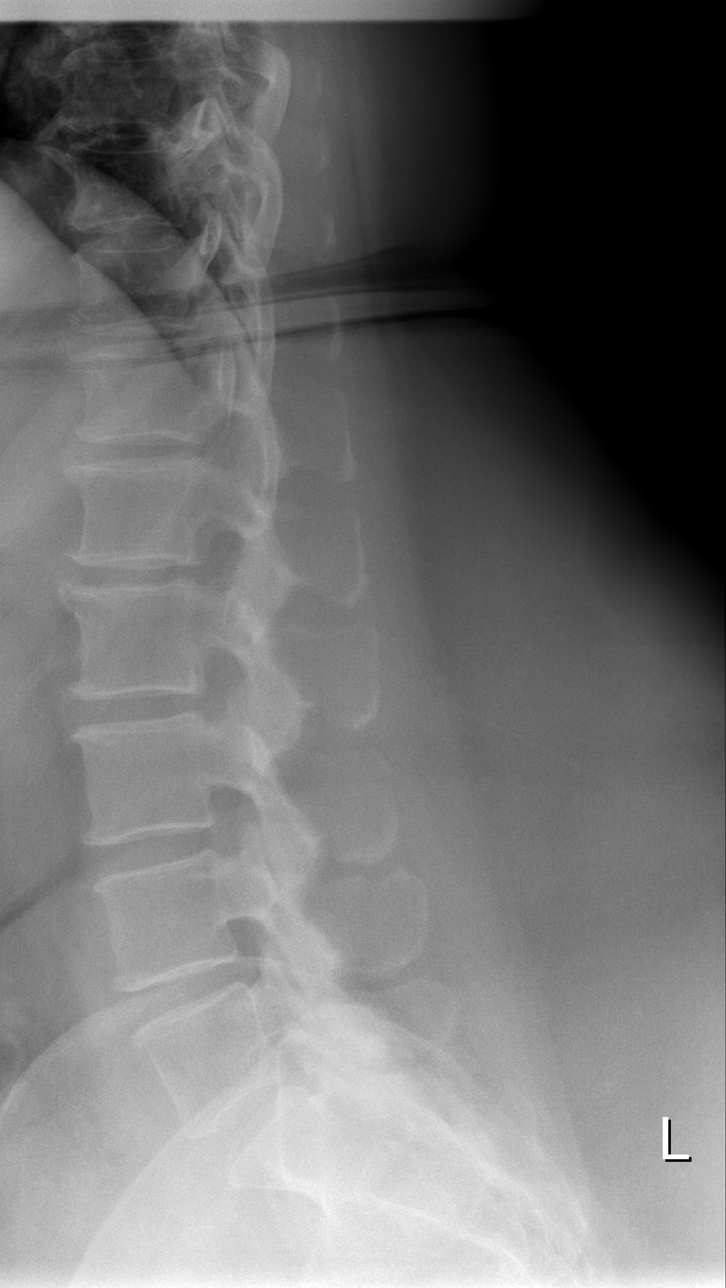

[t l-spine l5-s1 spot]
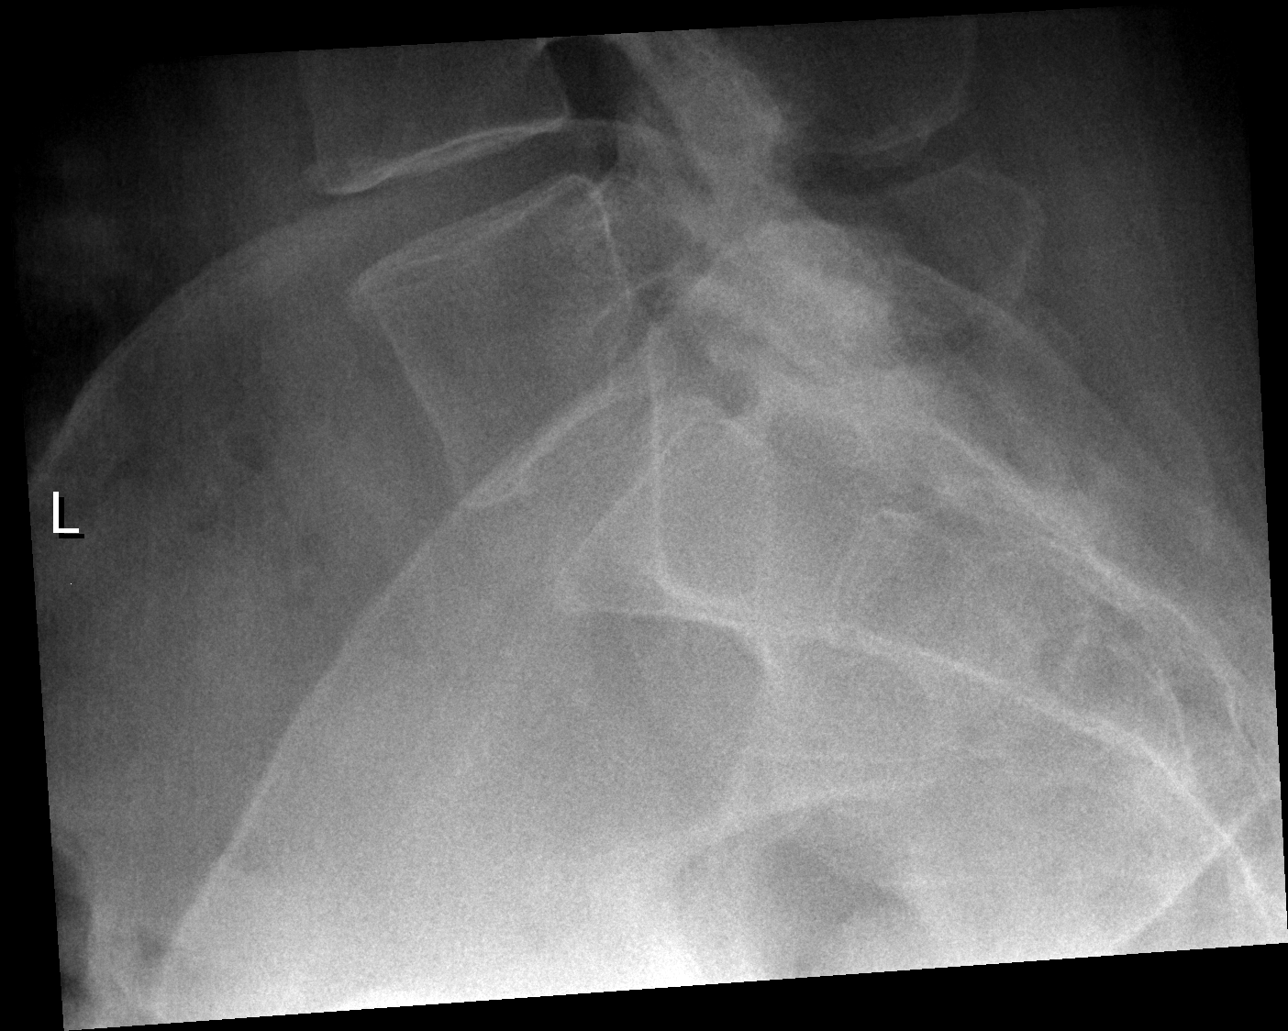

[5 of 5 positions shown; findings below may reference images not displayed]

FINDINGS: Vascular clips in the   right upper abdomen.  Normal
alignment.  The vertebral body and intervertebral disc heights are
well maintained throughout.  Small anterior endplate spurs at all
levels.  Mild facet degenerative hypertrophy L5-S1, right greater
than left.
IMPRESSION: 1.  Negative for fracture or other acute bony abnormality.
2.  Mild degenerative changes as above.

## 2011-11-29 IMAGING — CR DG THORACIC SPINE 2V
2 series · 2 of 2 positions shown · non-contrast
Comparison: Two-view chest radiograph 06/29/2010

CLINICAL DATA: Trauma and pain.

THORACIC SPINE - 2 VIEW

[t t-spine a.p.]
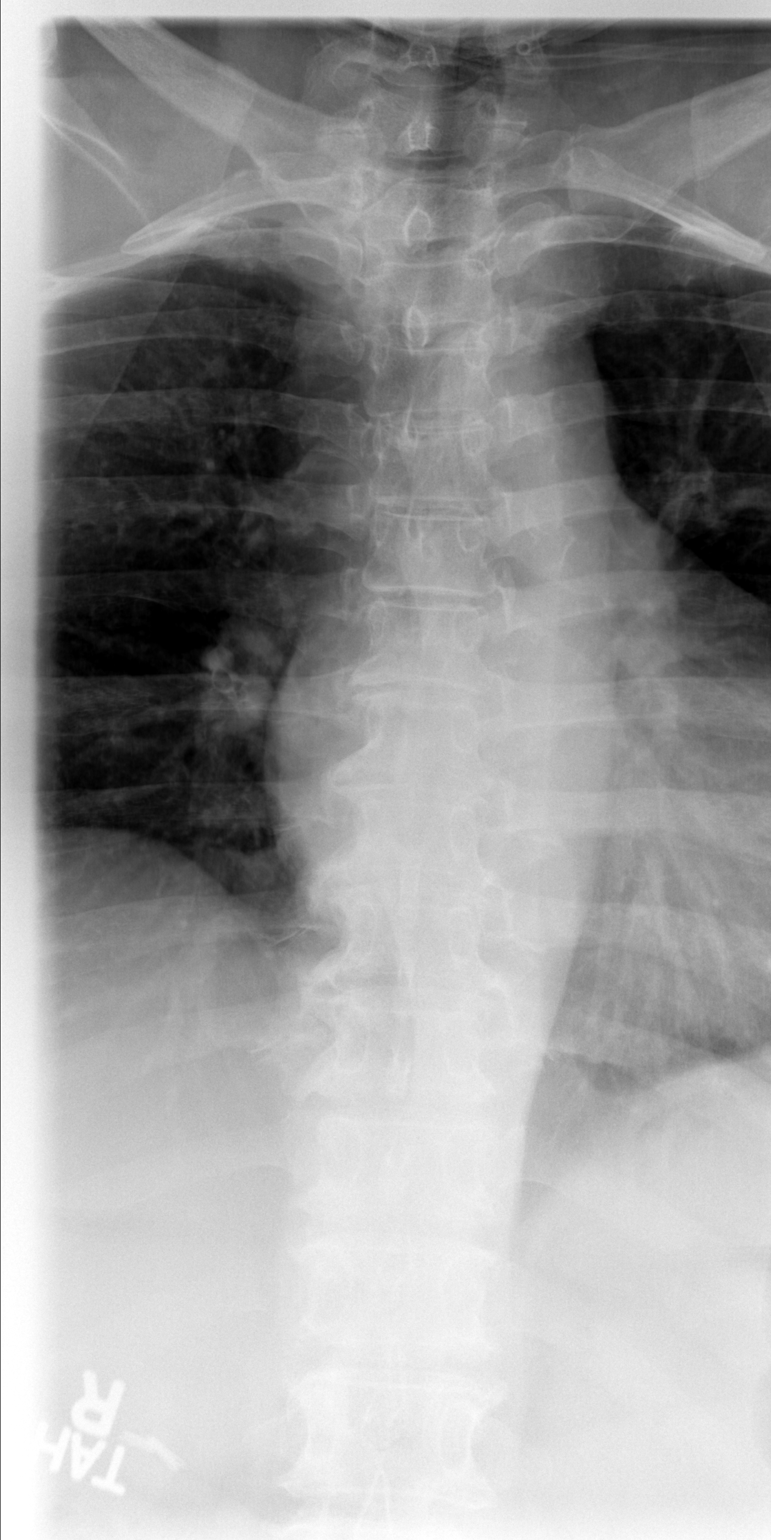

[t t-spine lat]
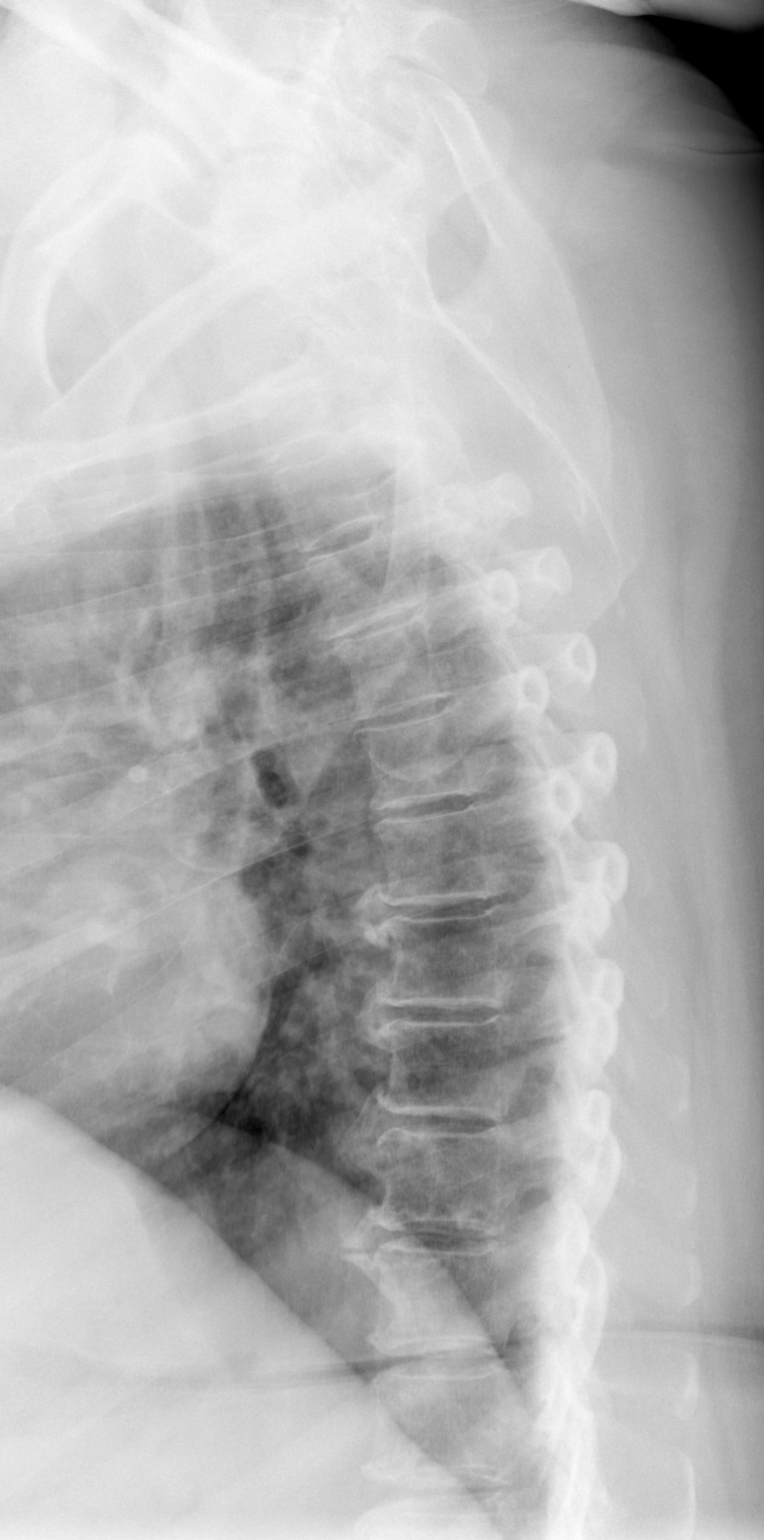

[2 of 2 positions shown; findings below may reference images not displayed]

FINDINGS: Bony mineralization appears normal.  Thoracic spine
vertebral bodies are normal in height and alignment.  There is
anterior osteophyte formation at several levels in the mid and
lower thoracic spine.  No acute fracture or suspicious bony
abnormality.
IMPRESSION: No acute bony abnormality.

Degenerative osseous spurring.

## 2011-11-29 IMAGING — CR DG RIBS BILAT 3V
4 series · 4 of 4 positions shown · non-contrast
Comparison: Chest radiographs 07/10/2010 and 06/29/2010.

CLINICAL DATA: Fall last night

BILATERAL RIBS - 3+ VIEW

[t ribs ap/pa upper left]
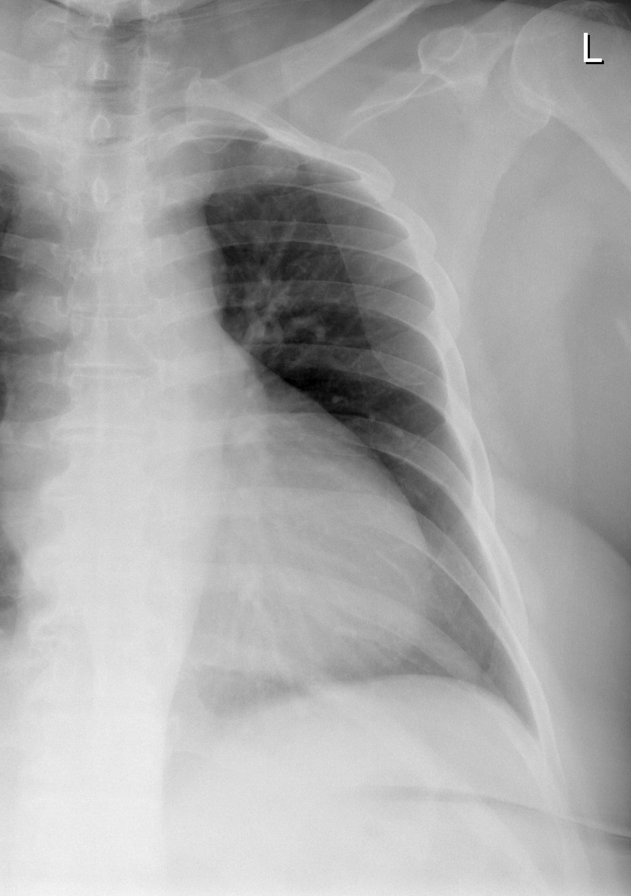

[t ribs ap/pa upper right]
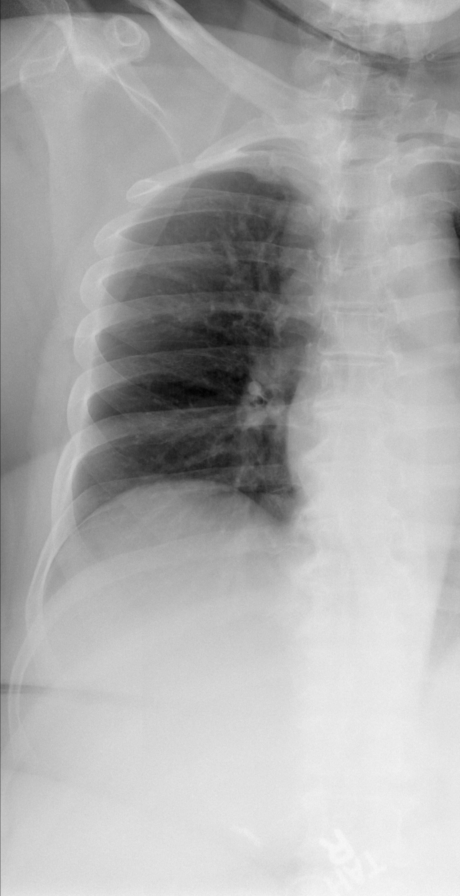

[t ribs obl. left]
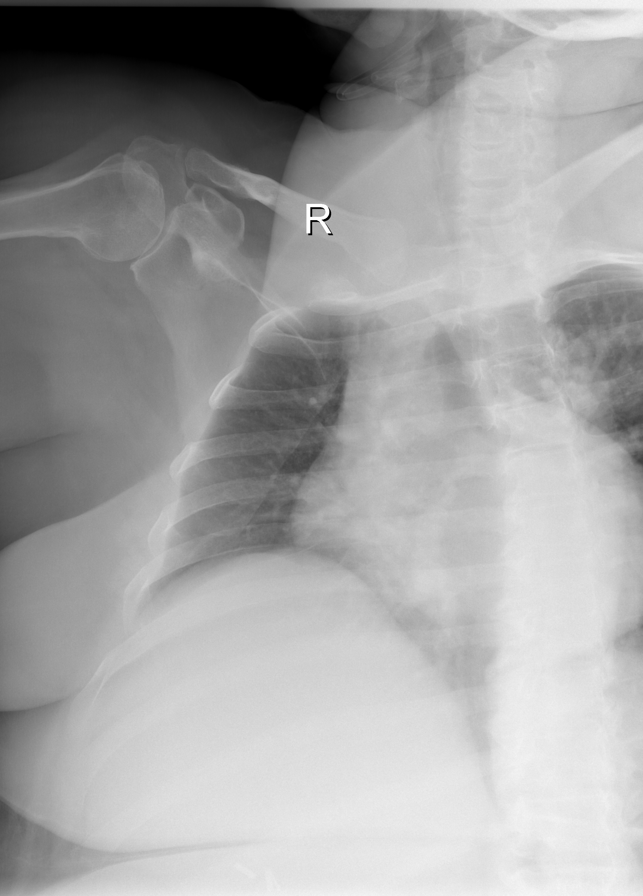

[t ribs obl. right]
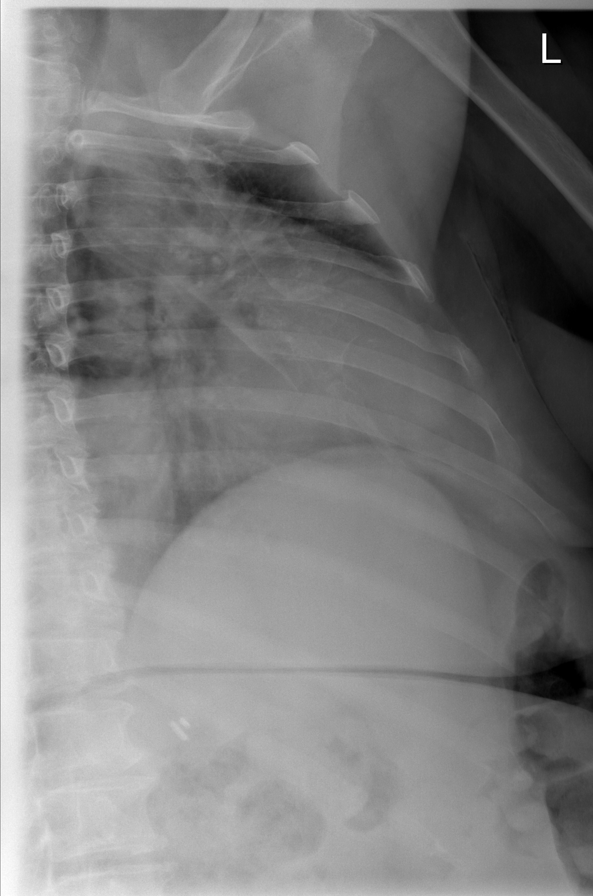

[4 of 4 positions shown; findings below may reference images not displayed]

FINDINGS: Both the left and right ribs are evaluated.  No acute or
healing rib fracture or destructive osseous lesion is identified.
IMPRESSION: No rib fracture is identified.

## 2011-12-05 IMAGING — CR DG CHEST 2V
2 series · 2 of 2 positions shown · non-contrast
Comparison: 07/10/2010

CLINICAL DATA: Chest pain.

CHEST - 2 VIEW

[w chest pa]
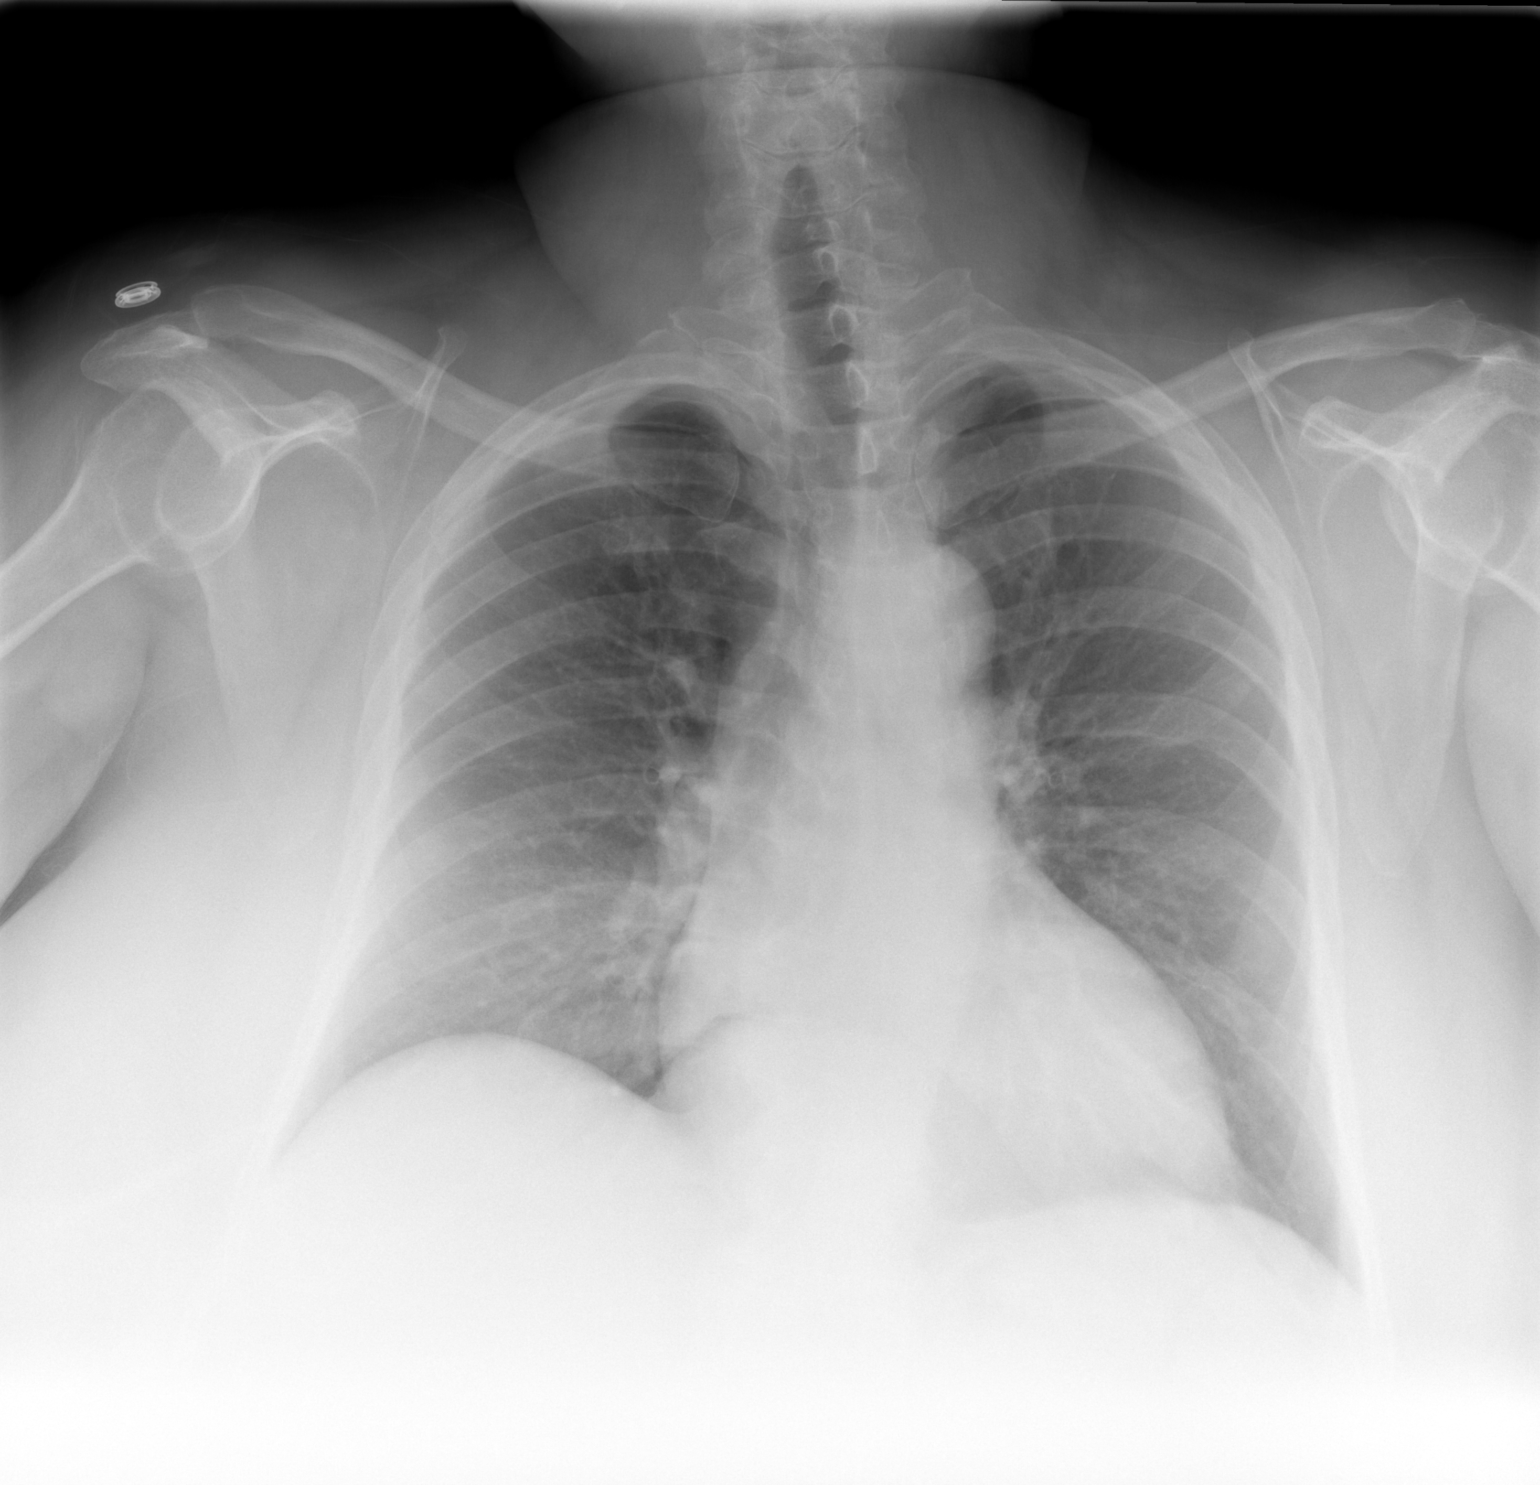

[w chest lat]
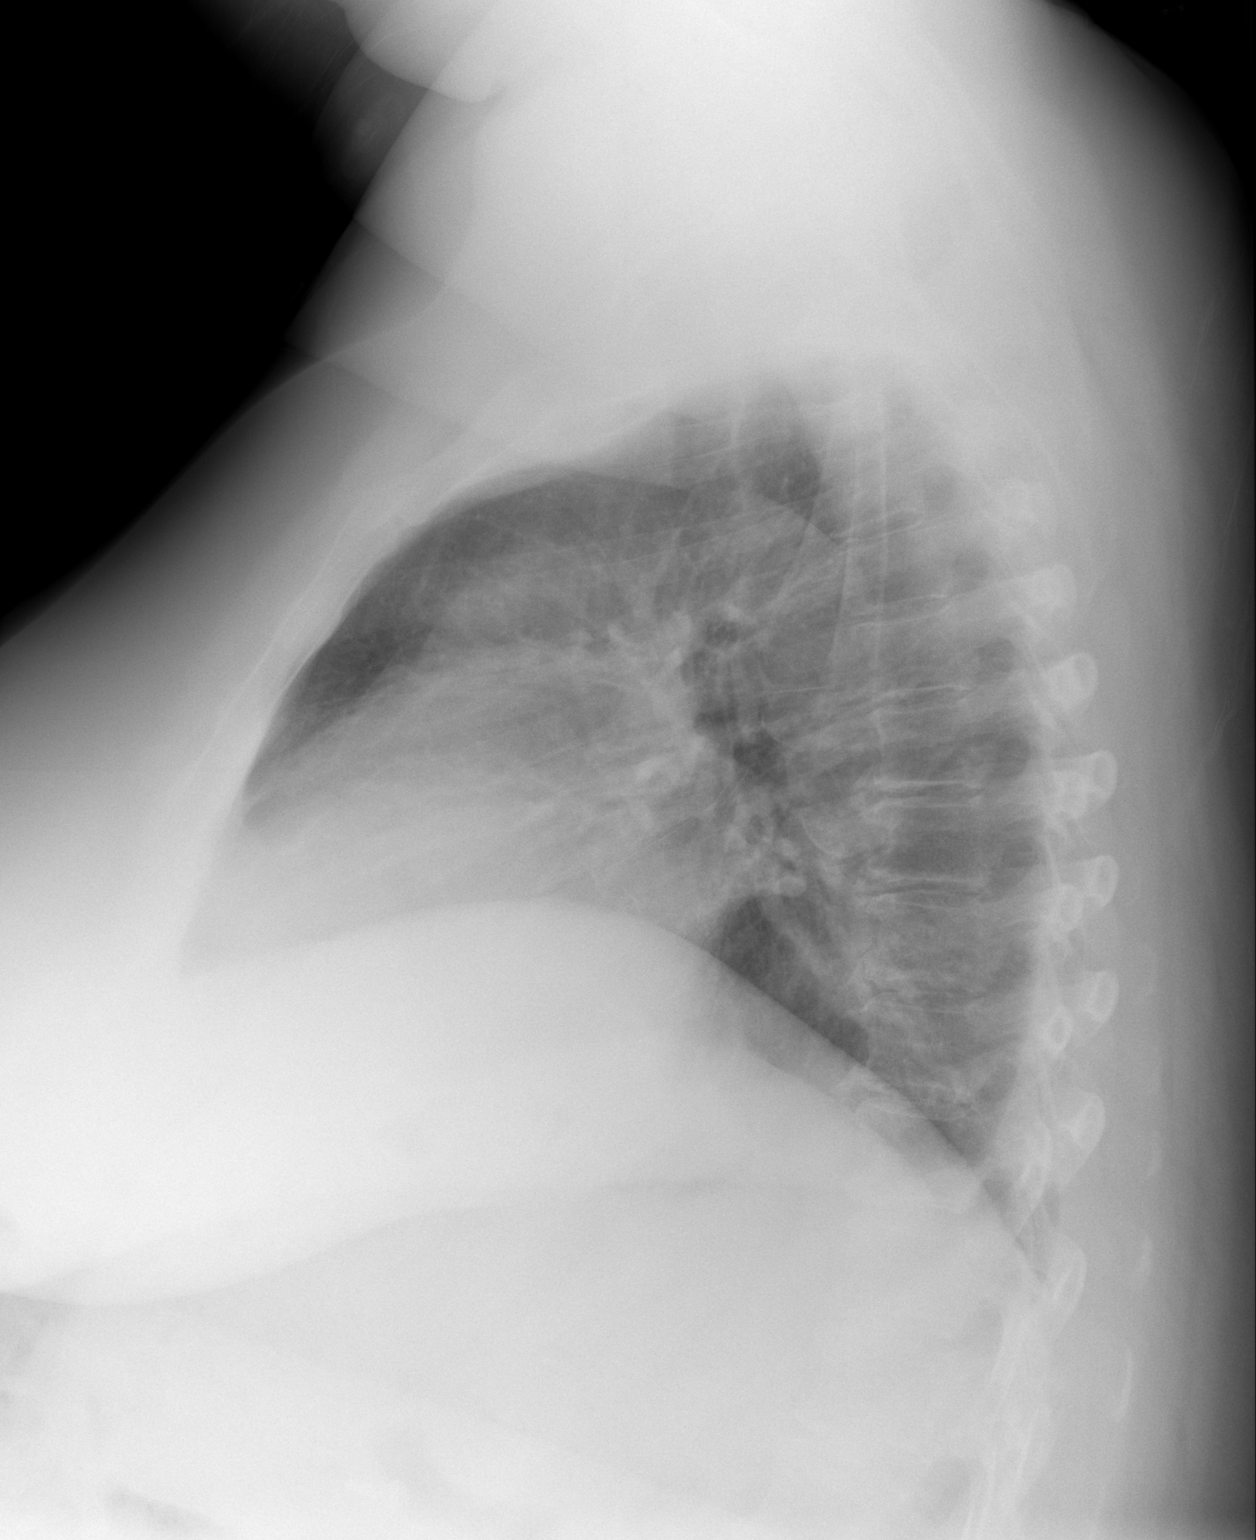

[2 of 2 positions shown; findings below may reference images not displayed]

FINDINGS: Heart and mediastinal contours are within normal limits.
No focal opacities or effusions.  No acute bony abnormality.  Mild
degenerative changes in the thoracic spine.
IMPRESSION: No active cardiopulmonary disease.

## 2011-12-06 ENCOUNTER — Encounter (HOSPITAL_COMMUNITY): Payer: Self-pay | Admitting: Radiology

## 2011-12-06 ENCOUNTER — Emergency Department (HOSPITAL_COMMUNITY): Payer: Medicare Other

## 2011-12-06 ENCOUNTER — Emergency Department (HOSPITAL_COMMUNITY)
Admission: EM | Admit: 2011-12-06 | Discharge: 2011-12-07 | Disposition: A | Discharge status: Home / Self Care | Payer: Medicare Other | Attending: Emergency Medicine | Admitting: Emergency Medicine

## 2011-12-06 DIAGNOSIS — E039 Hypothyroidism, unspecified: Secondary | ICD-10-CM | POA: Insufficient documentation

## 2011-12-06 DIAGNOSIS — I10 Essential (primary) hypertension: Secondary | ICD-10-CM | POA: Insufficient documentation

## 2011-12-06 DIAGNOSIS — R0789 Other chest pain: Secondary | ICD-10-CM

## 2011-12-06 DIAGNOSIS — K219 Gastro-esophageal reflux disease without esophagitis: Secondary | ICD-10-CM | POA: Insufficient documentation

## 2011-12-06 DIAGNOSIS — I252 Old myocardial infarction: Secondary | ICD-10-CM | POA: Insufficient documentation

## 2011-12-06 DIAGNOSIS — R079 Chest pain, unspecified: Secondary | ICD-10-CM | POA: Insufficient documentation

## 2011-12-06 DIAGNOSIS — M25519 Pain in unspecified shoulder: Secondary | ICD-10-CM | POA: Insufficient documentation

## 2011-12-06 DIAGNOSIS — I4891 Unspecified atrial fibrillation: Secondary | ICD-10-CM | POA: Insufficient documentation

## 2011-12-06 DIAGNOSIS — R0602 Shortness of breath: Secondary | ICD-10-CM | POA: Insufficient documentation

## 2011-12-06 DIAGNOSIS — J4489 Other specified chronic obstructive pulmonary disease: Secondary | ICD-10-CM | POA: Insufficient documentation

## 2011-12-06 DIAGNOSIS — J449 Chronic obstructive pulmonary disease, unspecified: Secondary | ICD-10-CM | POA: Insufficient documentation

## 2011-12-06 DIAGNOSIS — I509 Heart failure, unspecified: Secondary | ICD-10-CM | POA: Insufficient documentation

## 2011-12-06 DIAGNOSIS — E119 Type 2 diabetes mellitus without complications: Secondary | ICD-10-CM | POA: Insufficient documentation

## 2011-12-06 DIAGNOSIS — Z79899 Other long term (current) drug therapy: Secondary | ICD-10-CM | POA: Insufficient documentation

## 2011-12-06 LAB — POCT I-STAT, CHEM 8
Chloride: 100 mEq/L (ref 96–112)
HCT: 41 % (ref 36.0–46.0)
Hemoglobin: 13.9 g/dL (ref 12.0–15.0)
Potassium: 4.9 mEq/L (ref 3.5–5.1)
Sodium: 137 mEq/L (ref 135–145)

## 2011-12-06 LAB — CBC
HCT: 38.8 % (ref 36.0–46.0)
MCV: 100.8 fL — ABNORMAL HIGH (ref 78.0–100.0)
Platelets: 179 10*3/uL (ref 150–400)
RBC: 3.85 MIL/uL — ABNORMAL LOW (ref 3.87–5.11)
RDW: 13.3 % (ref 11.5–15.5)
WBC: 9 10*3/uL (ref 4.0–10.5)

## 2011-12-06 LAB — POCT I-STAT TROPONIN I: Troponin i, poc: 0 ng/mL (ref 0.00–0.08)

## 2011-12-06 MED ORDER — ONDANSETRON HCL 4 MG/2ML IJ SOLN
4.0000 mg | Freq: Once | INTRAMUSCULAR | Status: AC
  Administered 2011-12-06: 4 mg via INTRAVENOUS
  Filled 2011-12-06: qty 2

## 2011-12-06 MED ORDER — MORPHINE SULFATE 4 MG/ML IJ SOLN
6.0000 mg | Freq: Once | INTRAMUSCULAR | Status: AC
  Administered 2011-12-06: 6 mg via INTRAVENOUS
  Filled 2011-12-06: qty 2

## 2011-12-06 MED ORDER — MORPHINE SULFATE 4 MG/ML IJ SOLN
4.0000 mg | Freq: Once | INTRAMUSCULAR | Status: AC
  Administered 2011-12-06: 4 mg via INTRAVENOUS
  Filled 2011-12-06: qty 1

## 2011-12-06 MED ORDER — METOCLOPRAMIDE HCL 5 MG/ML IJ SOLN
10.0000 mg | Freq: Once | INTRAMUSCULAR | Status: AC
  Administered 2011-12-06: 10 mg via INTRAVENOUS
  Filled 2011-12-06: qty 2

## 2011-12-06 NOTE — ED Notes (Signed)
Pt complaining of nausea.   

## 2011-12-06 NOTE — ED Provider Notes (Signed)
History     CSN: [id number]  Arrival date & time 12/06/11  7829   First MD Initiated Contact with Patient 12/06/11 2002      Chief Complaint  Patient presents with  . Chest Pain    (Consider location/radiation/quality/duration/timing/severity/associated sxs/prior treatment) HPI Complains of right-sided anterior chest pain parasternal sharp in nature worse with lying down not made worse with walking onset 4 PM today pain was sudden onset one associated with one episode of vomiting, nausea continues no other associated symptoms treated with one sublingual nitroglycerin and 324 milligrams aspirin without relief Past Medical History  Diagnosis Date  . Schizoaffective disorder   . Urinary tract infection   . Hypertension   . Atrial fibrillation   . GERD (gastroesophageal reflux disease)   . Hypothyroidism   . Myocardial infarction   . Diabetes mellitus   . Obesity   . Asthma   . COPD (chronic obstructive pulmonary disease)   . Pneumonia   . CHF (congestive heart failure)     Past Surgical History  Procedure Date  . Tubal ligation   . Abdominal hysterectomy     History reviewed. No pertinent family history.  History  Substance Use Topics  . Smoking status: Former Smoker -- 0.0 packs/day  . Smokeless tobacco: Current User    Types: Snuff, Chew  . Alcohol Use: No    OB History    Grav Para Term Preterm Abortions TAB SAB Ect Mult Living                  Review of Systems  Constitutional: Negative.   HENT: Negative.   Respiratory: Negative.   Cardiovascular: Positive for chest pain.  Gastrointestinal: Positive for nausea and vomiting.  Musculoskeletal: Negative.   Skin: Negative.   Neurological: Negative.   Hematological: Negative.   Psychiatric/Behavioral: Negative.   All other systems reviewed and are negative.    Allergies  Sulfamethoxazole w-trimethoprim and Penicillins  Home Medications   Current Outpatient Rx  Name Route Sig Dispense Refill  .  ALBUTEROL SULFATE HFA 108 (90 BASE) MCG/ACT IN AERS Inhalation Inhale 2 puffs into the lungs every 6 (six) hours as needed. For wheezing    . AMLODIPINE BESYLATE 5 MG PO TABS Oral Take 5 mg by mouth daily.    . ASPIRIN EC 81 MG PO TBEC Oral Take 81 mg by mouth every morning.     Marland Kitchen CHLORPROMAZINE HCL 25 MG PO TABS Oral Take 25-50 mg by mouth 3 (three) times daily. 1 tablet twice a day and 2 additional tablets at bedtime    . DIVALPROEX SODIUM ER 500 MG PO TB24 Oral Take 1,000 mg by mouth 2 (two) times daily.    Marland Kitchen FLUTICASONE PROPIONATE 50 MCG/ACT NA SUSP Nasal Place 2 sprays into the nose daily as needed. For nasal congestion    . FOLIC ACID 1 MG PO TABS Oral Take 1 mg by mouth every morning.     Marland Kitchen LEVOTHYROXINE SODIUM 150 MCG PO TABS Oral Take 150 mcg by mouth daily.    Marland Kitchen LISINOPRIL 40 MG PO TABS Oral Take 40 mg by mouth daily.    Marland Kitchen METOPROLOL SUCCINATE ER 25 MG PO TB24 Oral Take 25 mg by mouth every morning.    Marland Kitchen NAPROXEN 500 MG PO TABS Oral Take 500 mg by mouth 2 (two) times daily as needed. For pain    . OMEPRAZOLE 20 MG PO CPDR Oral Take 20 mg by mouth daily.     Marland Kitchen  PALIPERIDONE ER 6 MG PO TB24 Oral Take 6 mg by mouth 2 (two) times daily.    Marland Kitchen NITROGLYCERIN 0.4 MG SL SUBL Sublingual Place 0.4 mg under the tongue every 5 (five) minutes as needed. For chest pain given by EMS x 1 dose      BP 126/109  Pulse 87  Temp 98.5 F (36.9 C) (Oral)  Resp 20  SpO2 94%  Physical Exam  Nursing note and vitals reviewed. Constitutional: She appears well-developed and well-nourished. She appears distressed.       Tearful appears uncomfortable  HENT:  Head: Normocephalic and atraumatic.  Eyes: Conjunctivae are normal. Pupils are equal, round, and reactive to light.  Neck: Neck supple. No tracheal deviation present. No thyromegaly present.  Cardiovascular: Normal rate and regular rhythm.   No murmur heard. Pulmonary/Chest: Effort normal and breath sounds normal. She exhibits tenderness.       Tender  at right parasternal area reproducing pain exactly  Abdominal: Soft. Bowel sounds are normal. She exhibits no distension. There is no tenderness.       Morbidly obese  Musculoskeletal: Normal range of motion. She exhibits no edema and no tenderness.  Neurological: She is alert. Coordination normal.  Skin: Skin is warm and dry. No rash noted.  Psychiatric: She has a normal mood and affect.    Date: 12/06/2011  Rate: 75  Rhythm: normal sinus rhythm  QRS Axis: normal  Intervals: normal  ST/T Wave abnormalities: nonspecific T wave changes  Conduction Disutrbances:none  Narrative Interpretation:   Old EKG Reviewed: Unchanged from 11/22/2011, interpreted by me Chest x-ray reviewed by me ED Course  Procedures (including critical care time)  Labs Reviewed - No data to display No results found. 930 pmPain temporarily improved after treatment intravenous morphine. At 12:40 AM pain recurs. Patient appears uncomfortable intravenous hydromorphone ordered. 1:20 AM continues to complain of pain right-sided par reexamined chest wall is exquisitely tender. Fentanyl 50  Micrograms ordered by me  No diagnosis found.  Results for orders placed during the hospital encounter of 12/06/11  CBC      Component Value Range   WBC 9.0  4.0 - 10.5 K/uL   RBC 3.85 (*) 3.87 - 5.11 MIL/uL   Hemoglobin 12.9  12.0 - 15.0 g/dL   HCT 40.9  81.1 - 91.4 %   MCV 100.8 (*) 78.0 - 100.0 fL   MCH 33.5  26.0 - 34.0 pg   MCHC 33.2  30.0 - 36.0 g/dL   RDW 78.2  95.6 - 21.3 %   Platelets 179  150 - 400 K/uL  D-DIMER, QUANTITATIVE      Component Value Range   D-Dimer, Quant 0.40  0.00 - 0.48 ug/mL-FEU  POCT I-STAT, CHEM 8      Component Value Range   Sodium 137  135 - 145 mEq/L   Potassium 4.9  3.5 - 5.1 mEq/L   Chloride 100  96 - 112 mEq/L   BUN 13  6 - 23 mg/dL   Creatinine, Ser 0.86 (*) 0.50 - 1.10 mg/dL   Glucose, Bld 578 (*) 70 - 99 mg/dL   Calcium, Ion 4.69  6.29 - 1.32 mmol/L   TCO2 26  0 - 100 mmol/L    Hemoglobin 13.9  12.0 - 15.0 g/dL   HCT 52.8  41.3 - 24.4 %  POCT I-STAT TROPONIN I      Component Value Range   Troponin i, poc 0.00  0.00 - 0.08 ng/mL   Comment 3  POCT I-STAT TROPONIN I      Component Value Range   Troponin i, poc 0.00  0.00 - 0.08 ng/mL   Comment 3            Dg Chest 2 View  12/06/2011  *RADIOLOGY REPORT*  Clinical Data: Chest pain.  Shortness of breath.  Cough.  Previous myocardial infarct.  Asthma.  CHEST - 2 VIEW  Comparison: 11/22/2011  Findings: Mild cardiomegaly stable.  Both lungs are clear.  No evidence of pleural effusion.  No mass or lymphadenopathy identified.  Thoracic spine degenerative changes are again seen.  IMPRESSION: Stable mild cardiomegaly.  No active lung disease.  Original Report Authenticated By: Danae Orleans, M.D.   Dg Chest 2 View  11/22/2011  *RADIOLOGY REPORT*  Clinical Data: Short of breath  CHEST - 2 VIEW  Comparison: 11/22/2011  Findings: Heart is mildly enlarged.  Lungs are clear.  Pulmonary vascularity is within normal limits.  IMPRESSION: Cardiomegaly without edema.  Original Report Authenticated By: Donavan Burnet, M.D.   Dg Chest 2 View  11/07/2011  *RADIOLOGY REPORT*  Clinical Data: Chest pain and left shoulder pain.  History of CHF, renal failure, and COPD.  CHEST - 2 VIEW  Comparison: Two-view chest x-ray 10/27/2011, 10/08/2028, 09/15/2011, 07/31/2011, 06/16/2011, 03/29/2010.  Findings: Cardiac silhouette moderately enlarged but stable. Thoracic aorta mildly tortuous, unchanged.  Hilar and mediastinal contours otherwise unremarkable.  Lungs clear.  Bronchovascular markings normal.  No pleural effusions.  Degenerative changes throughout the thoracic spine.  No significant interval change.  IMPRESSION: Stable cardiomegaly.  No acute cardiopulmonary disease.  Original Report Authenticated By: Arnell Sieving, M.D.   Dg Chest Portable 1 View  11/22/2011  *RADIOLOGY REPORT*  Clinical Data: Chest pain.  PORTABLE CHEST - 1 VIEW   Comparison: 11/07/2011.  Findings: The heart is mildly enlarged but stable.  There is tortuosity of the thoracic aorta.  The lungs are clear.  No pleural effusion.  The bony thorax is intact.  IMPRESSION: Mild stable cardiac enlargement.  No acute pulmonary findings.  Original Report Authenticated By: P. Loralie Champagne, M.D.   Dg Shoulder Left  11/07/2011  *RADIOLOGY REPORT*  Clinical Data: Chest pain and left shoulder pain.  No known injury.  LEFT SHOULDER - 2+ VIEW  Comparison: Left shoulder x-rays 06/04/2011.  Findings: Interval development of a shoulder joint effusion and calcified loose bodies within the joint.  No evidence of acute fracture or glenohumeral dislocation.  Limited range of motion suspected, as both AP images were obtained with the shoulder internally rotated.  Acromioclavicular joint intact with moderate degenerative changes.  Well-preserved bone mineral density.  IMPRESSION: No acute osseous abnormality.  Joint effusion and calcified loose bodies within the joint.  Degenerative changes involving the acromioclavicular joint.  Original Report Authenticated By: Arnell Sieving, M.D.     MDM  Strongly doubt acute coronary syndrome highly atypical symptoms not acute EKG 2 negative cardiac markers. Exam is consistent with musculoskeletal chest pain. Doubt pulmonary embolism i.e. low clinical suspicion a negative d-dimer Spoke with Dr. Algie Coffer Plan prescription hydrocodone-A. Pap An appointment has been scheduled for a patient in his office at 9:30 AM today Diagnoses atypical chest pain        Doug Sou, MD 12/07/11 1610  Doug Sou, MD 12/07/11 9604

## 2011-12-06 NOTE — ED Notes (Addendum)
Pt presents with sudden onset  right sided chest pain, N/V while at rest. Pt denies pain with pain with palpation but yells when palpation. Pt received 324mg  of ASA and nitro sublingual X 1

## 2011-12-06 NOTE — ED Notes (Signed)
Called into room by pt who reports she is short of breath; resp even, nonlabored at 24/min; o2 sat 97% RA; pt reassured; repeatedly stating, "I am just sick, I am sick"

## 2011-12-07 MED ORDER — HYDROMORPHONE HCL PF 1 MG/ML IJ SOLN
1.0000 mg | Freq: Once | INTRAMUSCULAR | Status: AC
  Administered 2011-12-07: 1 mg via INTRAVENOUS
  Filled 2011-12-07: qty 1

## 2011-12-07 MED ORDER — HYDROCODONE-ACETAMINOPHEN 5-325 MG PO TABS: 2.0000 | ORAL_TABLET | ORAL | Status: DC | PRN

## 2011-12-07 MED ORDER — HYDROMORPHONE HCL PF 1 MG/ML IJ SOLN: 1.0000 mg | Freq: Once | INTRAMUSCULAR | Status: DC

## 2011-12-07 MED ORDER — FENTANYL CITRATE 0.05 MG/ML IJ SOLN
50.0000 ug | Freq: Once | INTRAMUSCULAR | Status: AC
  Administered 2011-12-07: 50 ug via INTRAVENOUS
  Filled 2011-12-07: qty 2

## 2011-12-07 NOTE — ED Notes (Signed)
Patient hysterical, crying. States her chest is hurting.

## 2011-12-07 NOTE — ED Notes (Signed)
States her chest pain in worst. Rates pain as 10/10. Located around her heart

## 2011-12-07 NOTE — ED Notes (Signed)
Complaining of increased chest pain. MD notified. Will continue to evaluate

## 2011-12-07 NOTE — ED Notes (Signed)
Complaining of chest pain, cramping in right leg and nausea. States started at approximately 1600 on 12/06/11.

## 2011-12-08 ENCOUNTER — Other Ambulatory Visit (HOSPITAL_COMMUNITY): Payer: Self-pay

## 2011-12-08 ENCOUNTER — Encounter (HOSPITAL_COMMUNITY): Payer: Self-pay | Admitting: Emergency Medicine

## 2011-12-08 ENCOUNTER — Other Ambulatory Visit (HOSPITAL_COMMUNITY): Payer: Self-pay | Admitting: Cardiovascular Disease

## 2011-12-08 ENCOUNTER — Emergency Department (HOSPITAL_COMMUNITY)
Admission: EM | Admit: 2011-12-08 | Discharge: 2011-12-09 | Disposition: A | Discharge status: Home / Self Care | Payer: Medicare Other | Attending: Emergency Medicine | Admitting: Emergency Medicine

## 2011-12-08 DIAGNOSIS — I509 Heart failure, unspecified: Secondary | ICD-10-CM | POA: Insufficient documentation

## 2011-12-08 DIAGNOSIS — Z79899 Other long term (current) drug therapy: Secondary | ICD-10-CM | POA: Insufficient documentation

## 2011-12-08 DIAGNOSIS — R079 Chest pain, unspecified: Secondary | ICD-10-CM | POA: Insufficient documentation

## 2011-12-08 DIAGNOSIS — F259 Schizoaffective disorder, unspecified: Secondary | ICD-10-CM | POA: Insufficient documentation

## 2011-12-08 DIAGNOSIS — Z87891 Personal history of nicotine dependence: Secondary | ICD-10-CM | POA: Insufficient documentation

## 2011-12-08 DIAGNOSIS — K219 Gastro-esophageal reflux disease without esophagitis: Secondary | ICD-10-CM | POA: Insufficient documentation

## 2011-12-08 DIAGNOSIS — I10 Essential (primary) hypertension: Secondary | ICD-10-CM | POA: Insufficient documentation

## 2011-12-08 DIAGNOSIS — E119 Type 2 diabetes mellitus without complications: Secondary | ICD-10-CM | POA: Insufficient documentation

## 2011-12-08 DIAGNOSIS — E039 Hypothyroidism, unspecified: Secondary | ICD-10-CM | POA: Insufficient documentation

## 2011-12-08 DIAGNOSIS — F250 Schizoaffective disorder, bipolar type: Secondary | ICD-10-CM

## 2011-12-08 DIAGNOSIS — J4489 Other specified chronic obstructive pulmonary disease: Secondary | ICD-10-CM | POA: Insufficient documentation

## 2011-12-08 DIAGNOSIS — J449 Chronic obstructive pulmonary disease, unspecified: Secondary | ICD-10-CM | POA: Insufficient documentation

## 2011-12-08 DIAGNOSIS — E669 Obesity, unspecified: Secondary | ICD-10-CM | POA: Insufficient documentation

## 2011-12-08 DIAGNOSIS — I252 Old myocardial infarction: Secondary | ICD-10-CM | POA: Insufficient documentation

## 2011-12-08 NOTE — Discharge Instructions (Signed)
Schizoaffective Disorder Schizoaffective disorder is a condition with a combination of features of schizophrenia and a mood disorder. There are symptoms of a psychotic disorder such as distorted thinking, hallucinations or delusions (schizophrenia feature) and depression or mania (mood disorder feature). There are 2 subtypes based on the mood component:   Bipolar Type.   Depressive Type.  CAUSES  Schizoaffective disorder, like schizophrenia, appears to have distinct genetic links. It is unknown as to what exactly causes the disorder. Some experts believe it involves brain chemistry such as an imbalance of serotonin and dopamine in the brain. These naturally occurring chemicals help relay electronic signals in the brain and help regulate mood. Other experts have speculated whether fetal exposure to toxins or viral illness, or even birth complications may play a role. However, there is no proof of this relationship. SYMPTOMS  Symptoms are those of both schizophrenia and mood disorders. These may include:  Hearing, seeing, or feeling things that are not there (hallucinations).   False beliefs (delusions).   Not taking care of oneself (for example, not bathing or grooming).   Speaking in a way that makes no sense to others.   Withdrawing or feeling isolated from other people.   Thoughts that race from one idea to the next.   Feelings of sadness, guilt, hopelessness, and anxiety.   Feelings of being excessively happy, powerful, or energetic.   Feeling drained of energy.   Losing or gaining weight.   Being unable to concentrate.   Sleeping more or less than normal.  DIAGNOSIS  The diagnosis is made when the patient has features of both illnesses. The patient does not strictly have schizophrenia or a mood disorder alone. Unfortunately, determining if a patient has 2 separate illnesses (schizophrenia or a mood disorder), a combination of illnesses (schizophrenia and a mood disorder), or  perhaps even a separate illness apart from schizophrenia or a mood disorder is difficult. An accurate diagnosis has 3 key parts:  The patient clearly has a major depressive disorder or mania.   They meet the criteria for schizophrenia (distorted thinking, hallucinations or delusions).   The patient must have psychosis for at least 2 weeks without a mood disorder.  The diagnosis of this disorder frequently requires several evaluations. Caregivers will evaluate the patient's symptoms over a length of time. The diagnosis can be made when the following are observed:  An uninterrupted period of mental illness (possibly lasting weeks or months) occurs during which a major depressive episode, a manic episode (both are mood disorder components), or a mixed episode occurs with symptoms of schizophrenia. A major depressive episode must include a depressed mood. Also:   This uninterrupted period of illness includes a minimum 2 week episode of active distorted thinking, hallucinations or delusions (schizophrenia feature).   During this same 2 week (or greater) period, the patient has no mood changes of depression or mania.   Outside of the specific 2 week period noted above, mood episodes are present for a big part of the total active and lingering periods of illness.   The disturbance is not due to the direct effects of a substance (such as street drugs or prescription medications) or a general medical condition.   The bipolar type is diagnosed if the disturbance includes a period of mania, a major depressive episode or a mixed episode.   The depressive type is diagnosed if the problem includes only major depressive episodes.  TREATMENT  Treatment usually includes a combination of medications and counseling. The exact  regimen varies depending on the type and severity of symptoms. It also depends on whether the disorder is a depressive-type or bipolar-type. Medications are prescribed to help with  psychotic symptoms, stabilize mood and treat depression. Psychotherapy can help curb distorted thoughts, teach appropriate social skills and decrease social isolation. Medications may include:  Antipsychotics. These are also called neuroleptics. These are prescribed to help with psychotic symptoms such as delusions, paranoia and hallucinations. Common medications in this category include clozapine (Clozaril), risperidone (Risperdal) and olanzapine (Zyprexa).   Mood-stabilizing medications. When the schizoaffective disorder is bipolar-type, mood stabilizers can level out the highs and lows of bipolar disorder, also known as manic depression. People with bipolar disorder have episodes of mania and depressed mood. Examples of mood stabilizers include lithium (Eskalith, Lithobid) and divalproex (Depakote).   Antidepressants. When depression is the underlying mood disorder, antidepressants can help with feelings of sadness, hopelessness, or difficulty with sleep and concentration. Common medications include citalopram (Celexa), fluoxetine and escitalopram (Lexapro).  Non-medication treatment may include:  Psychotherapy and counseling. Building a trusting relationship in therapy can help people with schizoaffective disorder better understand their condition and feel hopeful about their future. Effective sessions focus on real-life plans, problems, and relationships. Skills and behaviors specific to the home or workplace may be discussed.   Family or group therapy. Treatment can be more effective when people with schizoaffective disorder can discuss their problems with others. Supportive group settings can help decrease social isolation and provide a reality check during periods of psychosis.  In general, people with schizoaffective disorder have a better prognosis than people with schizophrenia, but not as good as people with mood disorders only. Long-term treatment is necessary The prognosis varies from  person to person. HOME CARE INSTRUCTIONS   Take your medicine as prescribed, even when you are feeling and thinking well.   Participate in individual and group counseling as recommended by your caregiver.  SEEK MEDICAL CARE IF:   You feel that you are experiencing side effects from prescription medications.   Contact your caregiver if you feel that symptoms are worsening despite using medication as prescribed and participating in counseling/group therapy sessions.  SEEK IMMEDIATE MEDICAL CARE IF:  You feel an urge to hurt yourself or are thinking about committing suicide. This is an emergency and you should be evaluated immediately. MAKE SURE YOU:   Understand these instructions.   Will watch your condition.   Will get help right away if you are not doing well or get worse.  Document Released: 10/17/2006 Document Revised: 05/26/2011 Document Reviewed: 12/18/2006 [facility] Patient Information 2012 [place], [place].

## 2011-12-08 NOTE — ED Notes (Signed)
Dr. Effie Shy notified that pt wants to leave.

## 2011-12-08 NOTE — ED Notes (Signed)
[id number]<AV> Expected date:12/08/11<BR> Expected time: 8:52 PM<BR> Means of arrival:Ambulance<BR> Comments:<BR> hold

## 2011-12-08 NOTE — ED Notes (Signed)
Dr. Effie Shy notified of BP reading. Instructed to recheck manually. BP 99/66.

## 2011-12-08 NOTE — ED Notes (Signed)
Pt given ASA 324 mg per EMS and .4 NTG SL with no effect.
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
## 2011-12-08 NOTE — ED Notes (Signed)
Dr. Effie Shy re notified of manual BP.

## 2011-12-08 NOTE — ED Notes (Signed)
MD at bedside. Dr. Wentz at bedside.  
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
## 2011-12-09 ENCOUNTER — Other Ambulatory Visit (HOSPITAL_COMMUNITY): Payer: Self-pay

## 2011-12-15 ENCOUNTER — Other Ambulatory Visit (HOSPITAL_COMMUNITY): Payer: Self-pay

## 2011-12-16 ENCOUNTER — Encounter (HOSPITAL_COMMUNITY): Payer: Self-pay | Admitting: Emergency Medicine

## 2011-12-16 ENCOUNTER — Emergency Department (HOSPITAL_COMMUNITY): Payer: Medicare Other

## 2011-12-16 ENCOUNTER — Other Ambulatory Visit (HOSPITAL_COMMUNITY): Payer: Self-pay

## 2011-12-16 ENCOUNTER — Emergency Department (HOSPITAL_COMMUNITY)
Admission: EM | Admit: 2011-12-16 | Discharge: 2011-12-16 | Disposition: A | Discharge status: Home / Self Care | Payer: Medicare Other | Attending: Emergency Medicine | Admitting: Emergency Medicine

## 2011-12-16 DIAGNOSIS — F259 Schizoaffective disorder, unspecified: Secondary | ICD-10-CM | POA: Insufficient documentation

## 2011-12-16 DIAGNOSIS — J4489 Other specified chronic obstructive pulmonary disease: Secondary | ICD-10-CM | POA: Insufficient documentation

## 2011-12-16 DIAGNOSIS — Z79899 Other long term (current) drug therapy: Secondary | ICD-10-CM | POA: Insufficient documentation

## 2011-12-16 DIAGNOSIS — E039 Hypothyroidism, unspecified: Secondary | ICD-10-CM | POA: Insufficient documentation

## 2011-12-16 DIAGNOSIS — R0989 Other specified symptoms and signs involving the circulatory and respiratory systems: Secondary | ICD-10-CM | POA: Insufficient documentation

## 2011-12-16 DIAGNOSIS — R0602 Shortness of breath: Secondary | ICD-10-CM | POA: Insufficient documentation

## 2011-12-16 DIAGNOSIS — I509 Heart failure, unspecified: Secondary | ICD-10-CM | POA: Insufficient documentation

## 2011-12-16 DIAGNOSIS — R0600 Dyspnea, unspecified: Secondary | ICD-10-CM

## 2011-12-16 DIAGNOSIS — J449 Chronic obstructive pulmonary disease, unspecified: Secondary | ICD-10-CM | POA: Insufficient documentation

## 2011-12-16 DIAGNOSIS — K219 Gastro-esophageal reflux disease without esophagitis: Secondary | ICD-10-CM | POA: Insufficient documentation

## 2011-12-16 DIAGNOSIS — I4891 Unspecified atrial fibrillation: Secondary | ICD-10-CM | POA: Insufficient documentation

## 2011-12-16 DIAGNOSIS — I10 Essential (primary) hypertension: Secondary | ICD-10-CM | POA: Insufficient documentation

## 2011-12-16 DIAGNOSIS — R0609 Other forms of dyspnea: Secondary | ICD-10-CM | POA: Insufficient documentation

## 2011-12-16 DIAGNOSIS — I252 Old myocardial infarction: Secondary | ICD-10-CM | POA: Insufficient documentation

## 2011-12-16 LAB — POCT I-STAT, CHEM 8
Glucose, Bld: 139 mg/dL — ABNORMAL HIGH (ref 70–99)
HCT: 39 % (ref 36.0–46.0)
Hemoglobin: 13.3 g/dL (ref 12.0–15.0)
Potassium: 4.3 mEq/L (ref 3.5–5.1)

## 2011-12-16 MED ORDER — MORPHINE SULFATE 4 MG/ML IJ SOLN
4.0000 mg | Freq: Once | INTRAMUSCULAR | Status: AC
  Administered 2011-12-16: 4 mg via INTRAVENOUS
  Filled 2011-12-16: qty 1

## 2011-12-16 MED ORDER — ONDANSETRON HCL 4 MG/2ML IJ SOLN
4.0000 mg | Freq: Once | INTRAMUSCULAR | Status: AC
  Administered 2011-12-16: 4 mg via INTRAVENOUS
  Filled 2011-12-16: qty 2

## 2011-12-16 NOTE — Discharge Instructions (Signed)
Shortness of Breath Shortness of breath (dyspnea) is the feeling of uneasy breathing. Shortness of breath does not always mean that there is a life-threatening illness. However, shortness of breath requires immediate medical care. CAUSES  Causes for shortness of breath include:  Not enough oxygen in the air (as with high altitudes or with a smoke-filled room).   Short-term (acute) lung disease, including:   Infections such as pneumonia.   Fluid in the lungs, such as heart failure.   A blood clot in the lungs (pulmonary embolism).   Lasting (chronic) lung diseases.   Heart disease (heart attack, angina, heart failure, and others).   Low red blood cells (anemia).   Poor physical fitness. This can cause shortness of breath when you exercise.   Chest or back injuries or stiffness.   Being overweight (obese).   Anxiety. This can make you feel like you are not getting enough air.  DIAGNOSIS  Serious medical problems can usually be found during your physical exam. Many tests may also be done to determine why you are having shortness of breath. Tests include:  Chest X-rays.   Lung function tests.   Blood tests.   Electrocardiography.   Exercise testing.   A cardiac echo.   Imaging scans.  Your caregiver may not be able to find a cause for your shortness of breath after your exam. In this case, it is important to have a follow-up exam with your caregiver as directed.  HOME CARE INSTRUCTIONS   Do not smoke. Smoking is a common cause of shortness of breath. Ask for help to stop smoking.   Avoid being around chemicals that may bother your breathing (paint fumes, dust).   Rest as needed. Slowly resume your usual activities.   If medicines were prescribed, take them as directed for the full length of time directed. This includes oxygen and any inhaled medicines.   Follow up with your caregiver as directed. Waiting to do so or failure to follow up could result in worsening of  your condition and possible disability or death.   Be sure you understand what to do or who to call if your shortness of breath worsens.  SEEK MEDICAL CARE IF:   Your condition does not improve in the time expected.   You have a hard time doing your normal activities even with rest.   You have any side effects or problems with the medicines prescribed.   You develop any new symptoms.  SEEK IMMEDIATE MEDICAL CARE IF:   Your shortness of breath is getting worse.   You feel lightheaded, faint, or develop a cough not controlled with medicines.   You start coughing up blood.   You have pain with breathing.   You have chest pain or pain in your arms, shoulders, or abdomen.   You have a fever.   You are unable to walk up stairs or exercise the way you normally do.   Your symptoms are getting worse.  Document Released: 03/01/2001 Document Revised: 05/26/2011 Document Reviewed: 10/17/2007 [employer] Patient Information 2012 [employer]. 

## 2011-12-16 NOTE — ED Notes (Signed)
Patient stating she is feeling like she has to vomit.  Green bag given

## 2011-12-16 NOTE — ED Notes (Signed)
Discharge instructions given voiced understanding

## 2011-12-16 NOTE — ED Notes (Signed)
Old and new EKG given to Dr Rainey Pines

## 2011-12-16 NOTE — ED Provider Notes (Signed)
History     CSN: [id number]  Arrival date & time 12/16/11  7829   First MD Initiated Contact with Patient 12/16/11 1922      Chief Complaint  Patient presents with  . Shortness of Breath    (Consider location/radiation/quality/duration/timing/severity/associated sxs/prior treatment) HPI  61 year old female past medical history of schizoaffective disorder, hypertension, atrial fibrillation, GERD, hypothyroid, asthma, diabetes, MI, COPD , pneumonia, CHF present today with the acute onset of shortness of breath. The patient says that today she was feeling fine the morning when she had the sudden onset of her "lungs filling up with fluid" and productive cough.  She denies fevers or chest pain. She asked me if I could hear fluid in her lungs.   She denies any weakness or numbness.  Denies any chest arm or jaw pain. Her vital signs were normal on arrival.   Past Medical History  Diagnosis Date  . Schizoaffective disorder   . Urinary tract infection   . Hypertension   . Atrial fibrillation   . GERD (gastroesophageal reflux disease)   . Hypothyroidism   . Myocardial infarction   . Diabetes mellitus   . Obesity   . Asthma   . COPD (chronic obstructive pulmonary disease)   . Pneumonia   . CHF (congestive heart failure)     Past Surgical History  Procedure Date  . Tubal ligation   . Abdominal hysterectomy     No family history on file.  History  Substance Use Topics  . Smoking status: Former Smoker -- 0.0 packs/day  . Smokeless tobacco: Current User    Types: Snuff, Chew  . Alcohol Use: No    OB History    Grav Para Term Preterm Abortions TAB SAB Ect Mult Living                  Review of Systems Constitutional: Negative for fever and chills.  HENT: Negative for ear pain, sore throat and trouble swallowing.   Eyes: Negative for pain and visual disturbance.  Respiratory: POS for cough and shortness of breath.   Cardiovascular: Negative for chest pain and leg  swelling.  Gastrointestinal: Negative for nausea, vomiting, abdominal pain and diarrhea.  Genitourinary: Negative for dysuria, urgency and frequency.  Musculoskeletal: Negative for back pain and joint swelling. POS leg swelling Skin: Negative for rash and wound.  Neurological: Negative for dizziness, syncope, speech difficulty, weakness and numbness.   Allergies  Sulfamethoxazole w-trimethoprim and Penicillins  Home Medications   Current Outpatient Rx  Name Route Sig Dispense Refill  . ALBUTEROL SULFATE HFA 108 (90 BASE) MCG/ACT IN AERS Inhalation Inhale 1 puff into the lungs every 6 (six) hours as needed. For wheezing    . AMLODIPINE BESYLATE 5 MG PO TABS Oral Take 5 mg by mouth daily.    . ASPIRIN EC 81 MG PO TBEC Oral Take 81 mg by mouth every morning.     Marland Kitchen CHLORPROMAZINE HCL 50 MG PO TABS Oral Take 50-200 mg by mouth 3 (three) times daily. Take 1 tablet (50MG ) twice daily and 4 tablets (200MG ) at bedtime.    Marland Kitchen DIVALPROEX SODIUM ER 500 MG PO TB24 Oral Take 1,000 mg by mouth 2 (two) times daily.    Marland Kitchen FLUTICASONE PROPIONATE 50 MCG/ACT NA SUSP Nasal Place 2 sprays into the nose daily as needed. For nasal congestion    . FOLIC ACID 1 MG PO TABS Oral Take 1 mg by mouth every morning.     Marland Kitchen HYDROCODONE-ACETAMINOPHEN 5-325 MG  PO TABS Oral Take 1 tablet by mouth daily as needed. Pain.    Marland Kitchen LEVOTHYROXINE SODIUM 150 MCG PO TABS Oral Take 150 mcg by mouth daily.    Marland Kitchen LISINOPRIL 40 MG PO TABS Oral Take 40 mg by mouth every 12 (twelve) hours.     Marland Kitchen METOPROLOL SUCCINATE ER 50 MG PO TB24 Oral Take 50 mg by mouth daily. Take with or immediately following a meal.    . OMEPRAZOLE 20 MG PO CPDR Oral Take 20 mg by mouth daily.     Marland Kitchen PALIPERIDONE ER 6 MG PO TB24 Oral Take 6 mg by mouth 2 (two) times daily.      BP 87/46  Pulse 79  Temp 98 F (36.7 C) (Oral)  Resp 15  SpO2 97%  Physical Exam Consitutional: Pt in no acute distress.   Head: Normocephalic and atraumatic.  Eyes: Extraocular motion  intact, no scleral icterus Neck: Supple without meningismus, mass, or overt JVD Respiratory: Effort normal and breath sounds normal. No respiratory distress. CV: Heart regular rate and regular rhythm (sinus), no obvious murmurs.  Pulses +2 and symmetric Abdomen: Soft, non-tender, non-distended. No rebound or guarding.  MSK: Extremities are atraumatic without deformity, ROM intact Skin: Warm, dry, intact Neuro: Alert and oriented, no motor deficit noted.   Psychiatric: Mood and affect are normal  EKG:  Rate: 79 Rythym NS Axis: normal No gross conduction abnormalities appreciated.  No gross ST or T-wave abnormalities appreciated.  Essentially unchanged.    ED Course  Procedures (including critical care time)  Labs Reviewed  POCT I-STAT, CHEM 8 - Abnormal; Notable for the following:    Glucose, Bld 139 (*)     All other components within normal limits  PRO B NATRIURETIC PEPTIDE  POCT I-STAT TROPONIN I   Dg Chest 2 View  12/16/2011  *RADIOLOGY REPORT*  Clinical Data: Chest pain and shortness of breath  CHEST - 2 VIEW  Comparison: 12/06/2011  Findings:  Heart size is normal.  No pleural effusion or edema.  No airspace consolidation identified.  There is multilevel spondylosis within the thoracic spine.  IMPRESSION:  1.  No acute cardiopulmonary abnormalities. 2.  Thoracic spondylosis.  Original Report Authenticated By: Rosealee Albee, M.D.     1. Dyspnea       MDM   Unclear etiology of patient's complaints today. She says she has "fluid in her lungs" and is unable to breathe. Patient's has been seen a number of times recently for various chest complaints. She aspect here the fluid in her lungs, and is clearly forcing herself to over exhale to produce a wheezing sound. Of note her lungs were clear to auscultation. Her oxygen saturations were 100% on room air. Her vital signs are all normal she is afebrile.  She does have a history of heart failure. Her legs are obese, but there  is no pitting edema noted.  In short her presentation is not consistent with heart failure, acute coronary syndrome or pulmonary embolus. We'll do screening labs and CXR x-ray.    EKG not suggestive of acute coronary syndrome. Chest x-ray unremarkable.  Laboratory values unremarkable.  Patient actively requested pain medication, and was treated.  Workup negative. Discharged home to followup with cardiology and primary care doctors.  PT DC home stable.  Discussed with pt the clinical impression, treatment in the ED, and follow up plan.  We alslo discussed the indications for returning to the ED, which include shortness or breath, confusion, fever, new weakness  or numbness, chest pain, or any other concerning symptom.  The pt understood the treatment and plan, is stable, and is able to leave the ED.            Larrie Kass, MD 12/17/11 [phone]

## 2011-12-17 ENCOUNTER — Emergency Department (HOSPITAL_COMMUNITY): Payer: Medicare Other

## 2011-12-17 ENCOUNTER — Encounter (HOSPITAL_COMMUNITY): Payer: Self-pay | Admitting: *Deleted

## 2011-12-17 ENCOUNTER — Emergency Department (HOSPITAL_COMMUNITY)
Admission: EM | Admit: 2011-12-17 | Discharge: 2011-12-17 | Disposition: A | Discharge status: Home / Self Care | Payer: Medicare Other | Attending: Emergency Medicine | Admitting: Emergency Medicine

## 2011-12-17 DIAGNOSIS — I252 Old myocardial infarction: Secondary | ICD-10-CM | POA: Insufficient documentation

## 2011-12-17 DIAGNOSIS — I509 Heart failure, unspecified: Secondary | ICD-10-CM | POA: Insufficient documentation

## 2011-12-17 DIAGNOSIS — J4489 Other specified chronic obstructive pulmonary disease: Secondary | ICD-10-CM | POA: Insufficient documentation

## 2011-12-17 DIAGNOSIS — F17200 Nicotine dependence, unspecified, uncomplicated: Secondary | ICD-10-CM | POA: Insufficient documentation

## 2011-12-17 DIAGNOSIS — E039 Hypothyroidism, unspecified: Secondary | ICD-10-CM | POA: Insufficient documentation

## 2011-12-17 DIAGNOSIS — R079 Chest pain, unspecified: Secondary | ICD-10-CM

## 2011-12-17 DIAGNOSIS — F259 Schizoaffective disorder, unspecified: Secondary | ICD-10-CM | POA: Insufficient documentation

## 2011-12-17 DIAGNOSIS — I959 Hypotension, unspecified: Secondary | ICD-10-CM | POA: Insufficient documentation

## 2011-12-17 DIAGNOSIS — E119 Type 2 diabetes mellitus without complications: Secondary | ICD-10-CM | POA: Insufficient documentation

## 2011-12-17 DIAGNOSIS — I10 Essential (primary) hypertension: Secondary | ICD-10-CM | POA: Insufficient documentation

## 2011-12-17 DIAGNOSIS — I4891 Unspecified atrial fibrillation: Secondary | ICD-10-CM | POA: Insufficient documentation

## 2011-12-17 DIAGNOSIS — Z7982 Long term (current) use of aspirin: Secondary | ICD-10-CM | POA: Insufficient documentation

## 2011-12-17 DIAGNOSIS — Z79899 Other long term (current) drug therapy: Secondary | ICD-10-CM | POA: Insufficient documentation

## 2011-12-17 DIAGNOSIS — J449 Chronic obstructive pulmonary disease, unspecified: Secondary | ICD-10-CM | POA: Insufficient documentation

## 2011-12-17 DIAGNOSIS — K219 Gastro-esophageal reflux disease without esophagitis: Secondary | ICD-10-CM | POA: Insufficient documentation

## 2011-12-17 LAB — CBC WITH DIFFERENTIAL/PLATELET
Eosinophils Absolute: 0.1 10*3/uL (ref 0.0–0.7)
Eosinophils Relative: 1 % (ref 0–5)
HCT: 37 % (ref 36.0–46.0)
Hemoglobin: 11.9 g/dL — ABNORMAL LOW (ref 12.0–15.0)
Lymphs Abs: 2.5 10*3/uL (ref 0.7–4.0)
MCH: 33.4 pg (ref 26.0–34.0)
MCHC: 32.2 g/dL (ref 30.0–36.0)
MCV: 103.9 fL — ABNORMAL HIGH (ref 78.0–100.0)
Monocytes Absolute: 1.3 10*3/uL — ABNORMAL HIGH (ref 0.1–1.0)
Monocytes Relative: 15 % — ABNORMAL HIGH (ref 3–12)
Neutrophils Relative %: 55 % (ref 43–77)
RBC: 3.56 MIL/uL — ABNORMAL LOW (ref 3.87–5.11)

## 2011-12-17 LAB — CARDIAC PANEL(CRET KIN+CKTOT+MB+TROPI)
Relative Index: INVALID (ref 0.0–2.5)
Total CK: 21 U/L (ref 7–177)

## 2011-12-17 LAB — POCT I-STAT TROPONIN I: Troponin i, poc: 0 ng/mL (ref 0.00–0.08)

## 2011-12-17 MED ORDER — SODIUM CHLORIDE 0.9 % IV BOLUS (SEPSIS)
500.0000 mL | Freq: Once | INTRAVENOUS | Status: AC
  Administered 2011-12-17: 500 mL via INTRAVENOUS

## 2011-12-17 MED ORDER — OXYCODONE-ACETAMINOPHEN 5-325 MG PO TABS
1.0000 | ORAL_TABLET | Freq: Once | ORAL | Status: AC
  Administered 2011-12-17: 1 via ORAL
  Filled 2011-12-17: qty 1

## 2011-12-17 MED ORDER — OXYCODONE-ACETAMINOPHEN 5-325 MG PO TABS: 2.0000 | ORAL_TABLET | ORAL | Status: AC | PRN

## 2011-12-17 MED ORDER — ONDANSETRON HCL 4 MG/2ML IJ SOLN: 4.0000 mg | Freq: Once | INTRAMUSCULAR | Status: DC

## 2011-12-17 MED ORDER — MORPHINE SULFATE 4 MG/ML IJ SOLN: 4.0000 mg | Freq: Once | INTRAMUSCULAR | Status: DC

## 2011-12-17 NOTE — ED Notes (Signed)
Pt appears to have sleep apnea - when sleeping sats drop to 91%.  Placed on 2L ns.  X-ray at bedside.

## 2011-12-17 NOTE — Discharge Instructions (Signed)
Your chest x-ray does not show pneumonia or other significant illness.  Your blood tests, and EKG do not show any signs of a heart attack in your blood tests, also do not show any evidence of a blood clot in your lungs.  Use Percocet for pain.  Follow up with your Dr. for reevaluation.  If your symptoms persist
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
## 2011-12-17 NOTE — ED Notes (Signed)
Dr. Caporossi at bedside. 

## 2011-12-17 NOTE — ED Provider Notes (Signed)
History     CSN: [id number]  Arrival date & time 12/17/11  4098   First MD Initiated Contact with Patient 12/17/11 0536      Chief Complaint  Patient presents with  . Chest Pain    (Consider location/radiation/quality/duration/timing/severity/associated sxs/prior treatment) Patient is a 61 y.o. female presenting with chest pain. The history is provided by the patient and medical records.  Chest Pain Pertinent negatives for primary symptoms include no fever, no shortness of breath, no cough, no abdominal pain, no nausea and no vomiting.    the patient is a 61 year old, female, with a history of chronic pain and schizoaffective disorder.  She presents to emergency department complaining of constant, chest pain since approximately 1:00.  This morning.  She says the pain woke her up.  She denies cough, shortness of breath, nausea, vomiting, or diaphoresis.  She denies leg pain or swelling.  She had been seen one day ago in the emergency department for similar symptoms.  Past Medical History  Diagnosis Date  . Schizoaffective disorder   . Urinary tract infection   . Hypertension   . Atrial fibrillation   . GERD (gastroesophageal reflux disease)   . Hypothyroidism   . Myocardial infarction   . Diabetes mellitus   . Obesity   . Asthma   . COPD (chronic obstructive pulmonary disease)   . Pneumonia   . CHF (congestive heart failure)     Past Surgical History  Procedure Date  . Tubal ligation   . Abdominal hysterectomy     No family history on file.  History  Substance Use Topics  . Smoking status: Former Smoker -- 0.0 packs/day  . Smokeless tobacco: Current User    Types: Snuff, Chew  . Alcohol Use: No    OB History    Grav Para Term Preterm Abortions TAB SAB Ect Mult Living                  Review of Systems  Constitutional: Negative for fever and chills.  HENT: Negative for congestion.   Respiratory: Negative for cough and shortness of breath.     Cardiovascular: Positive for chest pain. Negative for leg swelling.  Gastrointestinal: Negative for nausea, vomiting and abdominal pain.  Skin: Negative for rash.  Neurological: Negative for headaches.  Psychiatric/Behavioral: Negative for confusion.  All other systems reviewed and are negative.    Allergies  Sulfamethoxazole w-trimethoprim and Penicillins  Home Medications   Current Outpatient Rx  Name Route Sig Dispense Refill  . ALBUTEROL SULFATE HFA 108 (90 BASE) MCG/ACT IN AERS Inhalation Inhale 1 puff into the lungs every 6 (six) hours as needed. For wheezing    . AMLODIPINE BESYLATE 5 MG PO TABS Oral Take 5 mg by mouth daily.    . ASPIRIN EC 81 MG PO TBEC Oral Take 81 mg by mouth every morning.     Marland Kitchen CHLORPROMAZINE HCL 50 MG PO TABS Oral Take 50-200 mg by mouth 3 (three) times daily. Take 1 tablet (50MG ) twice daily and 4 tablets (200MG ) at bedtime.    Marland Kitchen DIVALPROEX SODIUM ER 500 MG PO TB24 Oral Take 1,000 mg by mouth 2 (two) times daily.    Marland Kitchen FLUTICASONE PROPIONATE 50 MCG/ACT NA SUSP Nasal Place 2 sprays into the nose daily as needed. For nasal congestion    . FOLIC ACID 1 MG PO TABS Oral Take 1 mg by mouth every morning.     Marland Kitchen HYDROCODONE-ACETAMINOPHEN 5-325 MG PO TABS Oral Take 1 tablet  by mouth daily as needed. Pain.    Marland Kitchen LEVOTHYROXINE SODIUM 150 MCG PO TABS Oral Take 150 mcg by mouth daily.    Marland Kitchen LISINOPRIL 40 MG PO TABS Oral Take 40 mg by mouth every 12 (twelve) hours.     Marland Kitchen METOPROLOL SUCCINATE ER 50 MG PO TB24 Oral Take 50 mg by mouth daily. Take with or immediately following a meal.    . OMEPRAZOLE 20 MG PO CPDR Oral Take 20 mg by mouth daily.     Marland Kitchen PALIPERIDONE ER 6 MG PO TB24 Oral Take 6 mg by mouth 2 (two) times daily.    . OXYCODONE-ACETAMINOPHEN 5-325 MG PO TABS Oral Take 2 tablets by mouth every 4 (four) hours as needed for pain. 15 tablet 0    BP 98/58  Pulse 80  Temp 98 F (36.7 C) (Oral)  Resp 22  SpO2 95%  Physical Exam  Nursing note and vitals  reviewed. Constitutional: She is oriented to person, place, and time.       Morbidly obese  HENT:  Head: Normocephalic and atraumatic.  Eyes: Conjunctivae are normal.  Neck: Normal range of motion. Neck supple.  Cardiovascular: Normal rate and regular rhythm.   No murmur heard. Pulmonary/Chest: Effort normal and breath sounds normal. No respiratory distress. She exhibits no tenderness.  Abdominal: Soft. She exhibits no distension. There is no tenderness.  Neurological: She is alert and oriented to person, place, and time.  Skin: Skin is warm and dry.  Psychiatric: She has a normal mood and affect. Thought content normal.    ED Course  Procedures (including critical care time) 61 year old, female, presents emergency department with constant, chest pain.  That woke her up around 1:00 in the morning.  It is not associated with any other symptoms.  We will perform a chest x-ray, laboratory testing, and EKG, for further evaluation.  Review of previous charts demonstrates that she has been hypotensive in the past, including yesterday, when her systolic blood pressure was only 87.  Labs Reviewed  CBC WITH DIFFERENTIAL - Abnormal; Notable for the following:    RBC 3.56 (*)     Hemoglobin 11.9 (*)     MCV 103.9 (*)     Monocytes Relative 15 (*)     Monocytes Absolute 1.3 (*)     All other components within normal limits  POCT I-STAT TROPONIN I  D-DIMER, QUANTITATIVE  CARDIAC PANEL(CRET KIN+CKTOT+MB+TROPI)   Dg Chest 2 View  12/16/2011  *RADIOLOGY REPORT*  Clinical Data: Chest pain and shortness of breath  CHEST - 2 VIEW  Comparison: 12/06/2011  Findings:  Heart size is normal.  No pleural effusion or edema.  No airspace consolidation identified.  There is multilevel spondylosis within the thoracic spine.  IMPRESSION:  1.  No acute cardiopulmonary abnormalities. 2.  Thoracic spondylosis.  Original Report Authenticated By: Rosealee Albee, M.D.   Dg Chest Port 1 View  12/17/2011  *RADIOLOGY  REPORT*  Clinical Data: Chest pain, CHF  PORTABLE CHEST - 1 VIEW  Comparison: 12/16/2011  Findings: Degraded by hypoaeration and patient body habitus.  Heart size upper normal limits.  Central vascular congestion. No overt edema or focal consolidation.  No large effusion.  No pneumothorax. No acute osseous finding.  Multilevel degenerative change.  IMPRESSION: Cardiomegaly without overt edema or focal consolidation.  Original Report Authenticated By: Waneta Martins, M.D.     1. Chest pain     Date: 12/17/2011  R.  Cardiac enzymes, and d-dimer, negativeate: 75  Rhythm: normal sinus rhythm  QRS Axis: normal  Intervals: normal  ST/T Wave abnormalities: normal  Conduction Disutrbances: none  Narrative Interpretation: unremarkable   CP for several hours since it woke her at 1 am. Neg ces.   Morbidly obese but pain not pleuritic and ddimer neg.   Not toxic. No resp distress. No pneumonia.  MDM   Chronic pain Schizoaffective disorder Known hypotension No evidence of pneumonia, pe, acs. No resp distress        Cheri Guppy, MD 12/17/11 0730

## 2011-12-17 NOTE — ED Notes (Signed)
BP entered at 0730 is invalid.  Manual taken was 98/66
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
## 2011-12-18 NOTE — ED Provider Notes (Signed)
I saw and evaluated the patient, reviewed the resident's note and I agree with the findings and plan.  I have seen patient, she is awake, alert, no increased respiratory efforts.    I have seen and reviewed the EKG and agree with the findings as documented by the resident  Ethelda Chick, MD 12/18/11 1731

## 2011-12-26 ENCOUNTER — Emergency Department (HOSPITAL_COMMUNITY): Payer: Medicare Other

## 2011-12-26 ENCOUNTER — Inpatient Hospital Stay (HOSPITAL_COMMUNITY)
Admission: EM | Admit: 2011-12-26 | Discharge: 2011-12-28 | DRG: 313 | Disposition: A | Discharge status: Home / Self Care | Payer: Medicare Other | Attending: Cardiology | Admitting: Cardiology

## 2011-12-26 ENCOUNTER — Encounter (HOSPITAL_COMMUNITY): Payer: Self-pay

## 2011-12-26 DIAGNOSIS — F329 Major depressive disorder, single episode, unspecified: Secondary | ICD-10-CM | POA: Diagnosis present

## 2011-12-26 DIAGNOSIS — E785 Hyperlipidemia, unspecified: Secondary | ICD-10-CM | POA: Diagnosis present

## 2011-12-26 DIAGNOSIS — J449 Chronic obstructive pulmonary disease, unspecified: Secondary | ICD-10-CM | POA: Diagnosis present

## 2011-12-26 DIAGNOSIS — F259 Schizoaffective disorder, unspecified: Secondary | ICD-10-CM | POA: Diagnosis present

## 2011-12-26 DIAGNOSIS — I5032 Chronic diastolic (congestive) heart failure: Secondary | ICD-10-CM | POA: Diagnosis present

## 2011-12-26 DIAGNOSIS — R0789 Other chest pain: Principal | ICD-10-CM | POA: Diagnosis present

## 2011-12-26 DIAGNOSIS — F3289 Other specified depressive episodes: Secondary | ICD-10-CM | POA: Diagnosis present

## 2011-12-26 DIAGNOSIS — K219 Gastro-esophageal reflux disease without esophagitis: Secondary | ICD-10-CM | POA: Diagnosis present

## 2011-12-26 DIAGNOSIS — E039 Hypothyroidism, unspecified: Secondary | ICD-10-CM | POA: Diagnosis present

## 2011-12-26 DIAGNOSIS — Z6841 Body Mass Index (BMI) 40.0 and over, adult: Secondary | ICD-10-CM

## 2011-12-26 DIAGNOSIS — R079 Chest pain, unspecified: Secondary | ICD-10-CM

## 2011-12-26 DIAGNOSIS — I509 Heart failure, unspecified: Secondary | ICD-10-CM | POA: Diagnosis present

## 2011-12-26 DIAGNOSIS — J4489 Other specified chronic obstructive pulmonary disease: Secondary | ICD-10-CM | POA: Diagnosis present

## 2011-12-26 LAB — COMPREHENSIVE METABOLIC PANEL
AST: 14 U/L (ref 0–37)
Albumin: 2.9 g/dL — ABNORMAL LOW (ref 3.5–5.2)
Albumin: 2.8 g/dL — ABNORMAL LOW (ref 3.5–5.2)
Alkaline Phosphatase: 45 U/L (ref 39–117)
Alkaline Phosphatase: 44 U/L (ref 39–117)
BUN: 11 mg/dL (ref 6–23)
BUN: 11 mg/dL (ref 6–23)
CO2: 32 mEq/L (ref 19–32)
Chloride: 97 mEq/L (ref 96–112)
Creatinine, Ser: 0.84 mg/dL (ref 0.50–1.10)
GFR calc non Af Amer: 77 mL/min — ABNORMAL LOW (ref >90)
Potassium: 4.2 mEq/L (ref 3.5–5.1)
Potassium: 3.8 mEq/L (ref 3.5–5.1)
Total Bilirubin: 0.2 mg/dL — ABNORMAL LOW (ref 0.3–1.2)
Total Protein: 5.9 g/dL — ABNORMAL LOW (ref 6.0–8.3)

## 2011-12-26 LAB — DIFFERENTIAL
Basophils Absolute: 0 10*3/uL (ref 0.0–0.1)
Basophils Relative: 0 % (ref 0–1)
Monocytes Absolute: 0.6 10*3/uL (ref 0.1–1.0)
Neutro Abs: 1.7 10*3/uL (ref 1.7–7.7)

## 2011-12-26 LAB — CBC
HCT: 38.4 % (ref 36.0–46.0)
HCT: 36.6 % (ref 36.0–46.0)
MCHC: 32.8 g/dL (ref 30.0–36.0)
Platelets: 165 10*3/uL (ref 150–400)
RBC: 3.75 MIL/uL — ABNORMAL LOW (ref 3.87–5.11)
RDW: 13.5 % (ref 11.5–15.5)
RDW: 13.5 % (ref 11.5–15.5)
WBC: 5.2 10*3/uL (ref 4.0–10.5)

## 2011-12-26 LAB — CARDIAC PANEL(CRET KIN+CKTOT+MB+TROPI)
CK, MB: 1.2 ng/mL (ref 0.3–4.0)
Relative Index: INVALID (ref 0.0–2.5)
Total CK: 18 U/L (ref 7–177)
Troponin I: <0.3 ng/mL (ref <0.30)

## 2011-12-26 LAB — APTT: aPTT: 78 seconds — ABNORMAL HIGH (ref 24–37)

## 2011-12-26 LAB — PROTIME-INR
INR: 1.01 (ref 0.00–1.49)
Prothrombin Time: 12.9 seconds (ref 11.6–15.2)
Prothrombin Time: 13.5 seconds (ref 11.6–15.2)

## 2011-12-26 LAB — MAGNESIUM: Magnesium: 1.8 mg/dL (ref 1.5–2.5)

## 2011-12-26 LAB — POCT I-STAT TROPONIN I: Troponin i, poc: 0 ng/mL (ref 0.00–0.08)

## 2011-12-26 MED ORDER — ASPIRIN EC 81 MG PO TBEC
81.0000 mg | DELAYED_RELEASE_TABLET | Freq: Every day | ORAL | Status: DC
  Administered 2011-12-27 – 2011-12-28 (×2): 81 mg via ORAL
  Filled 2011-12-26 (×2): qty 1

## 2011-12-26 MED ORDER — ZOLPIDEM TARTRATE 5 MG PO TABS
5.0000 mg | ORAL_TABLET | Freq: Every evening | ORAL | Status: DC | PRN
  Administered 2011-12-26: 5 mg via ORAL
  Filled 2011-12-26: qty 1

## 2011-12-26 MED ORDER — DIVALPROEX SODIUM ER 500 MG PO TB24
1000.0000 mg | ORAL_TABLET | Freq: Two times a day (BID) | ORAL | Status: DC
  Administered 2011-12-26 – 2011-12-28 (×4): 1000 mg via ORAL
  Filled 2011-12-26 (×5): qty 2

## 2011-12-26 MED ORDER — PANTOPRAZOLE SODIUM 40 MG PO TBEC
40.0000 mg | DELAYED_RELEASE_TABLET | Freq: Once | ORAL | Status: AC
  Administered 2011-12-26: 40 mg via ORAL

## 2011-12-26 MED ORDER — NITROGLYCERIN IN D5W 200-5 MCG/ML-% IV SOLN
5.0000 ug/min | Freq: Once | INTRAVENOUS | Status: DC
  Filled 2011-12-26: qty 250

## 2011-12-26 MED ORDER — FOLIC ACID 1 MG PO TABS
1.0000 mg | ORAL_TABLET | Freq: Every day | ORAL | Status: DC
  Administered 2011-12-27 – 2011-12-28 (×2): 1 mg via ORAL
  Filled 2011-12-26 (×2): qty 1

## 2011-12-26 MED ORDER — LEVOTHYROXINE SODIUM 150 MCG PO TABS
150.0000 ug | ORAL_TABLET | Freq: Every day | ORAL | Status: DC
  Administered 2011-12-27 – 2011-12-28 (×2): 150 ug via ORAL
  Filled 2011-12-26 (×3): qty 1

## 2011-12-26 MED ORDER — HEPARIN BOLUS VIA INFUSION
4000.0000 [IU] | Freq: Once | INTRAVENOUS | Status: AC
  Administered 2011-12-26: 4000 [IU] via INTRAVENOUS

## 2011-12-26 MED ORDER — ACETAMINOPHEN 325 MG PO TABS: 650.0000 mg | ORAL_TABLET | ORAL | Status: DC | PRN

## 2011-12-26 MED ORDER — ATORVASTATIN CALCIUM 40 MG PO TABS
40.0000 mg | ORAL_TABLET | Freq: Every day | ORAL | Status: DC
  Administered 2011-12-27 – 2011-12-28 (×2): 40 mg via ORAL
  Filled 2011-12-26 (×2): qty 1

## 2011-12-26 MED ORDER — HEPARIN (PORCINE) IN NACL 100-0.45 UNIT/ML-% IJ SOLN
1500.0000 [IU]/h | INTRAMUSCULAR | Status: DC
  Administered 2011-12-26: 1000 [IU]/h via INTRAVENOUS
  Administered 2011-12-27 – 2011-12-28 (×2): 1400 [IU]/h via INTRAVENOUS
  Administered 2011-12-28: 1500 [IU]/h via INTRAVENOUS
  Filled 2011-12-26 (×5): qty 250

## 2011-12-26 MED ORDER — ASPIRIN EC 81 MG PO TBEC: 81.0000 mg | DELAYED_RELEASE_TABLET | Freq: Every day | ORAL | Status: DC

## 2011-12-26 MED ORDER — NITROGLYCERIN 0.4 MG SL SUBL
0.4000 mg | SUBLINGUAL_TABLET | SUBLINGUAL | Status: DC | PRN
  Administered 2011-12-26 (×2): 0.4 mg via SUBLINGUAL

## 2011-12-26 MED ORDER — OXYCODONE-ACETAMINOPHEN 5-325 MG PO TABS
2.0000 | ORAL_TABLET | Freq: Three times a day (TID) | ORAL | Status: DC | PRN
  Administered 2011-12-26 – 2011-12-28 (×3): 2 via ORAL
  Filled 2011-12-26 (×3): qty 2

## 2011-12-26 MED ORDER — SODIUM CHLORIDE 0.9 % IV SOLN
INTRAVENOUS | Status: DC
  Administered 2011-12-26: 19:00:00 via INTRAVENOUS

## 2011-12-26 MED ORDER — ALUM & MAG HYDROXIDE-SIMETH 200-200-20 MG/5ML PO SUSP
15.0000 mL | Freq: Once | ORAL | Status: AC
  Administered 2011-12-26: 15 mL via ORAL
  Filled 2011-12-26: qty 30

## 2011-12-26 MED ORDER — SODIUM CHLORIDE 0.9 % IV SOLN
1000.0000 mL | INTRAVENOUS | Status: DC
  Administered 2011-12-26: 1000 mL via INTRAVENOUS

## 2011-12-26 MED ORDER — ONDANSETRON HCL 4 MG/2ML IJ SOLN
4.0000 mg | Freq: Four times a day (QID) | INTRAMUSCULAR | Status: DC | PRN
  Administered 2011-12-26: 4 mg via INTRAVENOUS
  Filled 2011-12-26: qty 2

## 2011-12-26 MED ORDER — METOPROLOL TARTRATE 25 MG PO TABS
25.0000 mg | ORAL_TABLET | Freq: Two times a day (BID) | ORAL | Status: DC
  Administered 2011-12-26 – 2011-12-28 (×4): 25 mg via ORAL
  Filled 2011-12-26 (×5): qty 1

## 2011-12-26 MED ORDER — PANTOPRAZOLE SODIUM 40 MG PO TBEC
40.0000 mg | DELAYED_RELEASE_TABLET | Freq: Every day | ORAL | Status: DC
  Administered 2011-12-27 – 2011-12-28 (×2): 40 mg via ORAL
  Filled 2011-12-26 (×2): qty 1

## 2011-12-26 MED ORDER — ALUM & MAG HYDROXIDE-SIMETH 200-200-20 MG/5ML PO SUSP
30.0000 mL | Freq: Four times a day (QID) | ORAL | Status: DC | PRN
  Administered 2011-12-26: 30 mL via ORAL
  Filled 2011-12-26: qty 30

## 2011-12-26 MED ORDER — ASPIRIN 81 MG PO CHEW
324.0000 mg | CHEWABLE_TABLET | Freq: Once | ORAL | Status: AC
  Administered 2011-12-26: 324 mg via ORAL

## 2011-12-26 NOTE — Progress Notes (Signed)
ANTICOAGULATION CONSULT NOTE - Initial Consult  Pharmacy Consult for UFH Indication: USAP  Allergies  Allergen Reactions  . Sulfamethoxazole W-Trimethoprim Hives and Nausea And Vomiting  . Penicillins Hives    Patient Measurements: Height: 5\' 2"  (157.5 cm) IBW/kg (Calculated) : 50.1  Heparin Dosing Weight: 88kg  Vital Signs: Temp: 97.8 F (36.6 C) (07/08 1457) Temp src: Oral (07/08 1457) BP: 103/83 mmHg (07/08 1545) Pulse Rate: 75  (07/08 1545)  Labs:  Basename 12/26/11 1432  HGB 12.5  HCT 38.4  PLT 169  APTT 30  LABPROT 12.9  INR 0.95  HEPARINUNFRC --  CREATININE 0.82  CKTOTAL --  CKMB --  TROPONINI --    The CrCl is unknown because both a height and weight (above a minimum accepted value) are required for this calculation.   Medical History: Past Medical History  Diagnosis Date  . Schizoaffective disorder   . Urinary tract infection   . Hypertension   . Atrial fibrillation   . GERD (gastroesophageal reflux disease)   . Hypothyroidism   . Myocardial infarction   . Diabetes mellitus   . Obesity   . Asthma   . COPD (chronic obstructive pulmonary disease)   . Pneumonia   . CHF (congestive heart failure)     Medications:   (Not in a hospital admission)  Assessment: 61 y/o female patient admitted with chest pain requiring anticoagulation for r/o MI. Cardiac enzymes negative x1, EKG with T wave abnormalities. Admit to cardiology.  Goal of Therapy:  Heparin level 0.3-0.7 units/ml Monitor platelets by anticoagulation protocol: Yes   Plan:  Patient started on heparin gtt at 1000 units/hr and 4000 unit IV bolus. Most likely will be subtherapeutic on this rate but started per protocol. Will check 6 hour heparin level and monitor daily cbc and heparin level.  Verlene Mayer, PharmD, BCPS Pager [phone] 12/26/2011,4:42 PM

## 2011-12-26 NOTE — ED Notes (Signed)
Pt. B/P dropped to 80's systolic. Dr. Ignacia Palma notified.

## 2011-12-26 NOTE — ED Provider Notes (Signed)
History     CSN: [id number]  Arrival date & time 12/26/11  1213   First MD Initiated Contact with Patient 12/26/11 1335      Chief Complaint  Patient presents with  . Chest Pain    (Consider location/radiation/quality/duration/timing/severity/associated sxs/prior treatment) HPI Comments: Patient is a 61 year old woman who had onset of chest pain at 11 morning. She was talking to her sister when this started. She took aspirin and nitroglycerin, also had some nausea and vomiting. She has had repeat current episodes of chest pain, and was seen about a week ago with a negative workup. She was released from the hospital at that time. She requests admission for her chest pain this time, saying that she keeps being seen for chest pain getting sent home.  Patient is a 61 y.o. female presenting with chest pain. The history is provided by the patient and medical records. No language interpreter was used.  Chest Pain The chest pain began 1 - 2 hours ago. Duration of episode(s) is 20 minutes. Chest pain occurs constantly. The chest pain is improving. Associated with: Nothing. At its most intense, the pain is at 10/10. The pain is currently at 10/10. The quality of the pain is described as squeezing. The pain radiates to the left shoulder, right shoulder and upper back. Exacerbated by: Nothing. Primary symptoms include nausea and vomiting. She tried nitroglycerin and aspirin for the symptoms. Risk factors include lack of exercise, obesity and sedentary lifestyle. Past medical history comments: Had normal cardiac cath in 2007.  Procedure history is positive for cardiac catheterization and exercise treadmill test.     Past Medical History  Diagnosis Date  . Schizoaffective disorder   . Urinary tract infection   . Hypertension   . Atrial fibrillation   . GERD (gastroesophageal reflux disease)   . Hypothyroidism   . Myocardial infarction   . Diabetes mellitus   . Obesity   . Asthma   . COPD  (chronic obstructive pulmonary disease)   . Pneumonia   . CHF (congestive heart failure)     Past Surgical History  Procedure Date  . Tubal ligation   . Abdominal hysterectomy     No family history on file.  History  Substance Use Topics  . Smoking status: Former Smoker -- 0.0 packs/day  . Smokeless tobacco: Current User    Types: Snuff, Chew  . Alcohol Use: No    OB History    Grav Para Term Preterm Abortions TAB SAB Ect Mult Living                  Review of Systems  Constitutional: Negative.   HENT: Negative.   Eyes: Negative.   Respiratory: Negative.   Cardiovascular: Positive for chest pain.  Gastrointestinal: Positive for nausea and vomiting.  Genitourinary: Negative.   Musculoskeletal: Negative.   Skin: Negative.   Neurological: Negative.   Psychiatric/Behavioral: Negative.     Allergies  Sulfamethoxazole w-trimethoprim and Penicillins  Home Medications   Current Outpatient Rx  Name Route Sig Dispense Refill  . ALBUTEROL SULFATE HFA 108 (90 BASE) MCG/ACT IN AERS Inhalation Inhale 1 puff into the lungs every 6 (six) hours as needed. For wheezing    . AMLODIPINE BESYLATE 5 MG PO TABS Oral Take 5 mg by mouth daily.    . ASPIRIN EC 81 MG PO TBEC Oral Take 81 mg by mouth every morning.     Marland Kitchen CHLORPROMAZINE HCL 50 MG PO TABS Oral Take 50-200 mg by mouth  3 (three) times daily. Take 1 tablet (50MG ) twice daily and 4 tablets (200MG ) at bedtime.    Marland Kitchen DIVALPROEX SODIUM ER 500 MG PO TB24 Oral Take 1,000 mg by mouth 2 (two) times daily.    Marland Kitchen FLUTICASONE PROPIONATE 50 MCG/ACT NA SUSP Nasal Place 2 sprays into the nose daily as needed. For nasal congestion    . FOLIC ACID 1 MG PO TABS Oral Take 1 mg by mouth every morning.     Marland Kitchen LEVOTHYROXINE SODIUM 150 MCG PO TABS Oral Take 150 mcg by mouth daily.    Marland Kitchen LISINOPRIL 40 MG PO TABS Oral Take 40 mg by mouth every 12 (twelve) hours.     Marland Kitchen METOPROLOL SUCCINATE ER 50 MG PO TB24 Oral Take 50 mg by mouth daily. Take with or  immediately following a meal.    . OMEPRAZOLE 20 MG PO CPDR Oral Take 20 mg by mouth daily.     . OXYCODONE-ACETAMINOPHEN 5-325 MG PO TABS Oral Take 2 tablets by mouth every 4 (four) hours as needed for pain. 15 tablet 0  . PALIPERIDONE ER 6 MG PO TB24 Oral Take 6 mg by mouth 2 (two) times daily.      BP 120/87  Pulse 83  Temp 98 F (36.7 C) (Oral)  Resp 20  SpO2 99%  Physical Exam  Nursing note and vitals reviewed. Constitutional: She is oriented to person, place, and time.       Morbidly obese woman in no visible distress.  HENT:  Head: Normocephalic and atraumatic.  Right Ear: External ear normal.  Left Ear: External ear normal.  Mouth/Throat: Oropharynx is clear and moist.  Eyes: Conjunctivae and EOM are normal. Pupils are equal, round, and reactive to light.  Neck: Normal range of motion. Neck supple.  Cardiovascular: Normal rate, regular rhythm and normal heart sounds.   Pulmonary/Chest: Effort normal and breath sounds normal.  Abdominal: Soft. Bowel sounds are normal.  Musculoskeletal: Normal range of motion. She exhibits no edema and no tenderness.  Neurological: She is alert and oriented to person, place, and time.       No sensory or motor deficit.  Skin: Skin is warm and dry.  Psychiatric: She has a normal mood and affect. Her behavior is normal.    ED Course  Procedures (including critical care time)   Labs Reviewed  CBC  COMPREHENSIVE METABOLIC PANEL  PROTIME-INR  APTT  URINALYSIS, ROUTINE W REFLEX MICROSCOPIC   12:32 PM  Date: 12/26/2011  Rate:83  Rhythm: normal sinus rhythm  QRS Axis: normal  Intervals: normal  ST/T Wave abnormalities: nonspecific T wave changes  Conduction Disutrbances:none  Narrative Interpretation: Borderline EKG  Old EKG Reviewed: unchanged  Pt seen --> physical exam performed.  EKG non-acute. Lab workup ordered.  Old charts reviewed --> last cardiac cath was in 2007 and was normal.    3:55 PM Results for orders placed  during the hospital encounter of 12/26/11  CBC      Component Value Range   WBC 5.2  4.0 - 10.5 K/uL   RBC 3.75 (*) 3.87 - 5.11 MIL/uL   Hemoglobin 12.5  12.0 - 15.0 g/dL   HCT 16.1  09.6 - 04.5 %   MCV 102.4 (*) 78.0 - 100.0 fL   MCH 33.3  26.0 - 34.0 pg   MCHC 32.6  30.0 - 36.0 g/dL   RDW 40.9  81.1 - 91.4 %   Platelets 169  150 - 400 K/uL  COMPREHENSIVE METABOLIC PANEL  Component Value Range   Sodium 135  135 - 145 mEq/L   Potassium 4.2  3.5 - 5.1 mEq/L   Chloride 97  96 - 112 mEq/L   CO2 32  19 - 32 mEq/L   Glucose, Bld 108 (*) 70 - 99 mg/dL   BUN 11  6 - 23 mg/dL   Creatinine, Ser 1.61  0.50 - 1.10 mg/dL   Calcium 9.6  8.4 - 09.6 mg/dL   Total Protein 6.2  6.0 - 8.3 g/dL   Albumin 2.9 (*) 3.5 - 5.2 g/dL   AST 14  0 - 37 U/L   ALT 12  0 - 35 U/L   Alkaline Phosphatase 45  39 - 117 U/L   Total Bilirubin 0.2 (*) 0.3 - 1.2 mg/dL   GFR calc non Af Amer 77 (*) >90 mL/min   GFR calc Af Amer 90 (*) >90 mL/min  PROTIME-INR      Component Value Range   Prothrombin Time 12.9  11.6 - 15.2 seconds   INR 0.95  0.00 - 1.49  APTT      Component Value Range   aPTT 30  24 - 37 seconds  POCT I-STAT TROPONIN I      Component Value Range   Troponin i, poc 0.00  0.00 - 0.08 ng/mL   Comment 3            Dg Chest 2 View  12/16/2011  *RADIOLOGY REPORT*  Clinical Data: Chest pain and shortness of breath  CHEST - 2 VIEW  Comparison: 12/06/2011  Findings:  Heart size is normal.  No pleural effusion or edema.  No airspace consolidation identified.  There is multilevel spondylosis within the thoracic spine.  IMPRESSION:  1.  No acute cardiopulmonary abnormalities. 2.  Thoracic spondylosis.  Original Report Authenticated By: Rosealee Albee, M.D.   Dg Chest 2 View  12/06/2011  *RADIOLOGY REPORT*  Clinical Data: Chest pain.  Shortness of breath.  Cough.  Previous myocardial infarct.  Asthma.  CHEST - 2 VIEW  Comparison: 11/22/2011  Findings: Mild cardiomegaly stable.  Both lungs are clear.   No evidence of pleural effusion.  No mass or lymphadenopathy identified.  Thoracic spine degenerative changes are again seen.  IMPRESSION: Stable mild cardiomegaly.  No active lung disease.  Original Report Authenticated By: Danae Orleans, M.D.   Dg Chest Portable 1 View  12/26/2011  *RADIOLOGY REPORT*  Clinical Data: Chest pain, shortness of breath  PORTABLE CHEST - 1 VIEW  Comparison: 12/17/2011  Findings: Limited exam because of patient body habitus and portable technique.  Heart is enlarged with vascular congestion and basilar atelectasis.  Decreased lung volumes evident.  No definite focal pneumonia, collapse, consolidation, effusion or pneumothorax.  IMPRESSION: Low volume exam with cardiomegaly and vascular congestion  Original Report Authenticated By: Judie Petit. Ruel Favors, M.D.   Lab workup shows normal TIN. Chest x-ray shows some cardiomegaly and pulmonary vascular congestion.  EKG was benign.  Call to Dr. Sharyn Lull , pt's choice of cardiologist --> he advised treating pt with heparin and nitroglycerin IV and admit her to a telemetry bed.  1. Chest pain           Carleene Cooper III, MD 12/26/11 [phone]

## 2011-12-26 NOTE — ED Notes (Signed)
MD at bedside. 

## 2011-12-26 NOTE — H&P (Signed)
[name] is an 61 y.o. female.   Chief Complaint: Recurrent chest pain HPI: Patient is 61 year old female with past medical history significant for multiple medical problems i.e. hypertension hypercholesteremia history of A. fib flutter hypothyroidism morbid obesity depression COPD history of congestive heart failure secondary to diastolic dysfunction history of schizoaffective disorder she came to the ER complaining of recurrent retrosternal chest pain described as sharp/squeezing radiating to right arm and then left arm associated with nausea vomiting and shortness of breath. Patient denies any palpitation lightheadedness or syncope also complains of shortness of breath with minimal exertion. Her denies history of PND orthopnea leg swelling patient had extensive cardiac workup in the past and a left cath in 2007 which showed mild coronary artery disease subsequently had the nuclear stress test x2 her which were negative for ischemia. EKG done in the ER showed normal sinus rhythm with minor T wave changes in anteroseptal leads.  Past Medical History  Diagnosis Date  . Schizoaffective disorder   . Urinary tract infection   . Hypertension   . Atrial fibrillation   . GERD (gastroesophageal reflux disease)   . Hypothyroidism   . Myocardial infarction   . Diabetes mellitus   . Obesity   . Asthma   . COPD (chronic obstructive pulmonary disease)   . Pneumonia   . CHF (congestive heart failure)     Past Surgical History  Procedure Date  . Tubal ligation   . Abdominal hysterectomy     No family history on file. Social History:  reports that she has quit smoking. Her smokeless tobacco use includes Snuff and Chew. She reports that she does not drink alcohol or use illicit drugs.  Allergies:  Allergies  Allergen Reactions  . Sulfamethoxazole W-Trimethoprim Hives and Nausea And Vomiting  . Penicillins Hives     (Not in a hospital admission)  Results for orders placed during the  hospital encounter of 12/26/11 (from the past 48 hour(s))  CBC     Status: Abnormal   Collection Time   12/26/11  2:32 PM      Component Value Range Comment   WBC 5.2  4.0 - 10.5 K/uL    RBC 3.75 (*) 3.87 - 5.11 MIL/uL    Hemoglobin 12.5  12.0 - 15.0 g/dL    HCT 96.0  45.4 - 09.8 %    MCV 102.4 (*) 78.0 - 100.0 fL    MCH 33.3  26.0 - 34.0 pg    MCHC 32.6  30.0 - 36.0 g/dL    RDW 11.9  14.7 - 82.9 %    Platelets 169  150 - 400 K/uL   COMPREHENSIVE METABOLIC PANEL     Status: Abnormal   Collection Time   12/26/11  2:32 PM      Component Value Range Comment   Sodium 135  135 - 145 mEq/L    Potassium 4.2  3.5 - 5.1 mEq/L    Chloride 97  96 - 112 mEq/L    CO2 32  19 - 32 mEq/L    Glucose, Bld 108 (*) 70 - 99 mg/dL    BUN 11  6 - 23 mg/dL    Creatinine, Ser 5.62  0.50 - 1.10 mg/dL    Calcium 9.6  8.4 - 13.0 mg/dL    Total Protein 6.2  6.0 - 8.3 g/dL    Albumin 2.9 (*) 3.5 - 5.2 g/dL    AST 14  0 - 37 U/L    ALT 12  0 - 35 U/L    Alkaline Phosphatase 45  39 - 117 U/L    Total Bilirubin 0.2 (*) 0.3 - 1.2 mg/dL    GFR calc non Af Amer 77 (*) >90 mL/min    GFR calc Af Amer 90 (*) >90 mL/min   PROTIME-INR     Status: Normal   Collection Time   12/26/11  2:32 PM      Component Value Range Comment   Prothrombin Time 12.9  11.6 - 15.2 seconds    INR 0.95  0.00 - 1.49   APTT     Status: Normal   Collection Time   12/26/11  2:32 PM      Component Value Range Comment   aPTT 30  24 - 37 seconds   POCT I-STAT TROPONIN I     Status: Normal   Collection Time   12/26/11  2:53 PM      Component Value Range Comment   Troponin i, poc 0.00  0.00 - 0.08 ng/mL    Comment 3             Dg Chest Portable 1 View  12/26/2011  *RADIOLOGY REPORT*  Clinical Data: Chest pain, shortness of breath  PORTABLE CHEST - 1 VIEW  Comparison: 12/17/2011  Findings: Limited exam because of patient body habitus and portable technique.  Heart is enlarged with vascular congestion and basilar atelectasis.  Decreased lung  volumes evident.  No definite focal pneumonia, collapse, consolidation, effusion or pneumothorax.  IMPRESSION: Low volume exam with cardiomegaly and vascular congestion  Original Report Authenticated By: Judie Petit. Ruel Favors, M.D.    Review of Systems  Constitutional: Negative for fever, chills and weight loss.  HENT: Negative for hearing loss.   Eyes: Negative for blurred vision.  Respiratory: Negative for cough, hemoptysis and sputum production.   Cardiovascular: Positive for chest pain. Negative for palpitations, orthopnea, claudication and leg swelling.  Gastrointestinal: Positive for nausea and vomiting. Negative for abdominal pain.  Neurological: Negative for dizziness and headaches.    Blood pressure 102/53, pulse 72, temperature 97.8 F (36.6 C), temperature source Oral, resp. rate 14, height 5\' 2"  (1.575 m), SpO2 97.00%. Physical Exam  Constitutional: She is oriented to person, place, and time. She appears well-developed.  HENT:  Head: Normocephalic and atraumatic.  Eyes: Conjunctivae and EOM are normal. Pupils are equal, round, and reactive to light.  Neck: Normal range of motion. Neck supple. No JVD present. No tracheal deviation present. No thyromegaly present.  Cardiovascular: Normal rate and regular rhythm.   Murmur (Soft systolic murmur noted no S3 gallop) heard. Respiratory: Effort normal and breath sounds normal. No respiratory distress. She has no wheezes. She has no rales.  GI: Soft. Bowel sounds are normal. She exhibits no distension. There is no tenderness. There is no rebound and no guarding.  Musculoskeletal: She exhibits no edema and no tenderness.  Lymphadenopathy:    She has no cervical adenopathy.  Neurological: She is alert and oriented to person, place, and time.     Assessment/Plan Atypical chest pain with minor EKGs rule out MI Hypertension Hypercholesteremia Status post A. fib flutter in the past Hypothyroidism Morbid obesity Depression History of  schizoaffective disorder COPD GERD Plan As per orders [place] N 12/26/2011, 5:28 PM

## 2011-12-26 NOTE — ED Notes (Signed)
Pt. Reports around 1100 this AM started having right sided chest pain, radiating to left side, down left and right arm and to back.  Reports pain 10/10.  Also reports N/V/D and SOB starting at the same time. HX of CHF dx 4 days ago.

## 2011-12-26 NOTE — ED Provider Notes (Deleted)
12:32 PM  Date: 12/26/2011  Rate:83  Rhythm: normal sinus rhythm  QRS Axis: normal  Intervals: normal  ST/T Wave abnormalities: nonspecific T wave changes  Conduction Disutrbances:none  Narrative Interpretation: Borderline EKG  Old EKG Reviewed: unchanged    Carleene Cooper III, MD 12/26/11 1235

## 2011-12-26 NOTE — ED Notes (Signed)
Chest pain 10/10

## 2011-12-26 NOTE — ED Notes (Signed)
Per EMS, pt. C/o of right sided chest pain that moved to left side, radiating down left arm and to back. Given 4 baby aspirin. Reports V/D this AM. Hx of CHF and MI.

## 2011-12-27 ENCOUNTER — Inpatient Hospital Stay (HOSPITAL_COMMUNITY): Payer: Medicare Other

## 2011-12-27 LAB — HEPARIN LEVEL (UNFRACTIONATED)
Heparin Unfractionated: <0.1 IU/mL — ABNORMAL LOW (ref 0.30–0.70)
Heparin Unfractionated: 0.47 IU/mL (ref 0.30–0.70)

## 2011-12-27 LAB — TSH: TSH: 1.69 u[IU]/mL (ref 0.350–4.500)

## 2011-12-27 LAB — CBC
Hemoglobin: 11.9 g/dL — ABNORMAL LOW (ref 12.0–15.0)
RBC: 3.65 MIL/uL — ABNORMAL LOW (ref 3.87–5.11)

## 2011-12-27 LAB — BASIC METABOLIC PANEL
CO2: 31 mEq/L (ref 19–32)
Chloride: 98 mEq/L (ref 96–112)
Sodium: 135 mEq/L (ref 135–145)

## 2011-12-27 LAB — URINALYSIS, ROUTINE W REFLEX MICROSCOPIC
Bilirubin Urine: NEGATIVE
Hgb urine dipstick: NEGATIVE
Specific Gravity, Urine: 1.01 (ref 1.005–1.030)
pH: 6.5 (ref 5.0–8.0)

## 2011-12-27 LAB — LIPID PANEL
HDL: 44 mg/dL (ref >39)
LDL Cholesterol: 88 mg/dL (ref 0–99)
Total CHOL/HDL Ratio: 3.3 RATIO
Triglycerides: 70 mg/dL (ref <150)
VLDL: 14 mg/dL (ref 0–40)

## 2011-12-27 LAB — CARDIAC PANEL(CRET KIN+CKTOT+MB+TROPI)
Relative Index: INVALID (ref 0.0–2.5)
Relative Index: INVALID (ref 0.0–2.5)
Total CK: 24 U/L (ref 7–177)
Troponin I: <0.3 ng/mL (ref <0.30)

## 2011-12-27 IMAGING — CR DG CHEST 2V
2 series · 2 of 2 positions shown · non-contrast
Comparison: 08/20/2010

CLINICAL DATA: Chest pain and shortness of breath

CHEST - 2 VIEW

[w chest pa *]
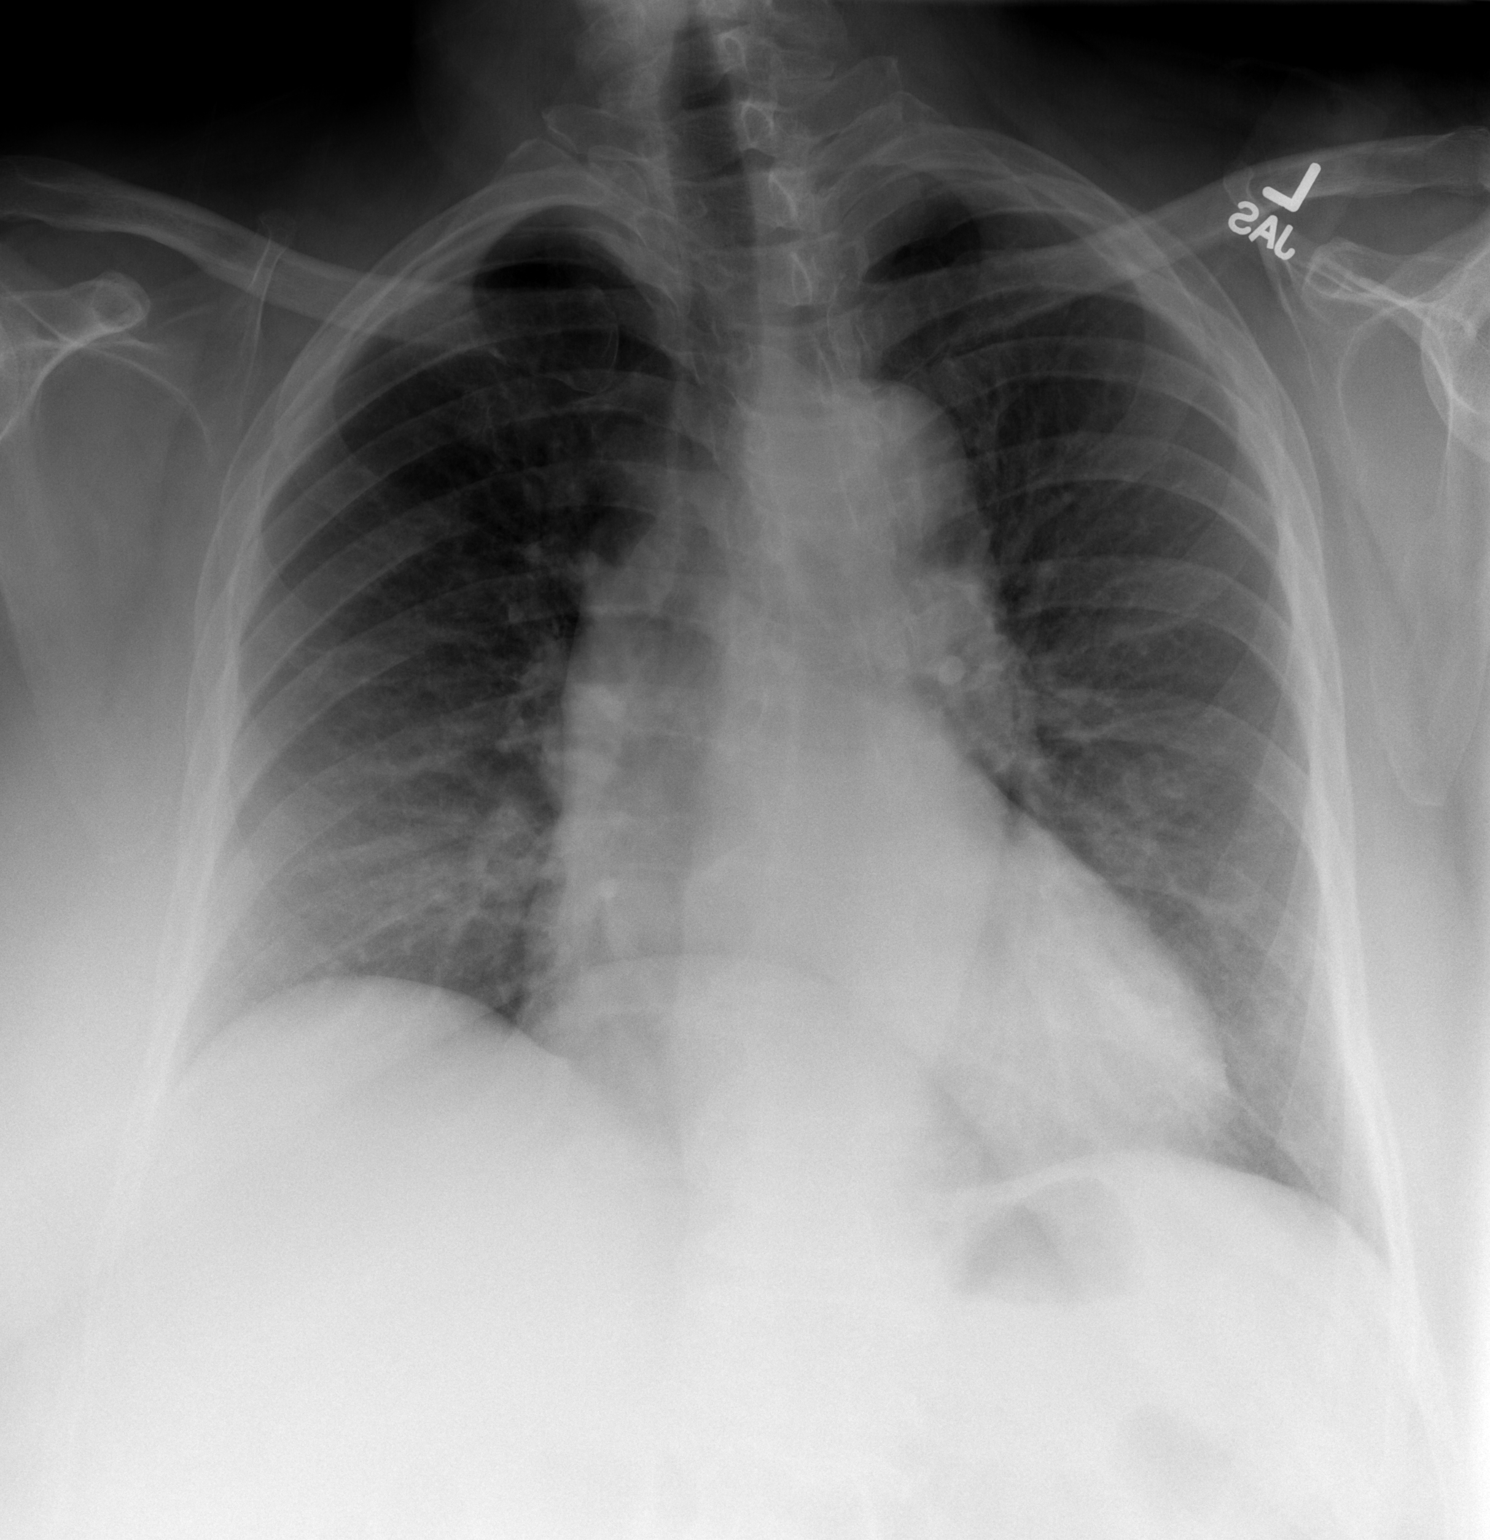

[w chest lat *]
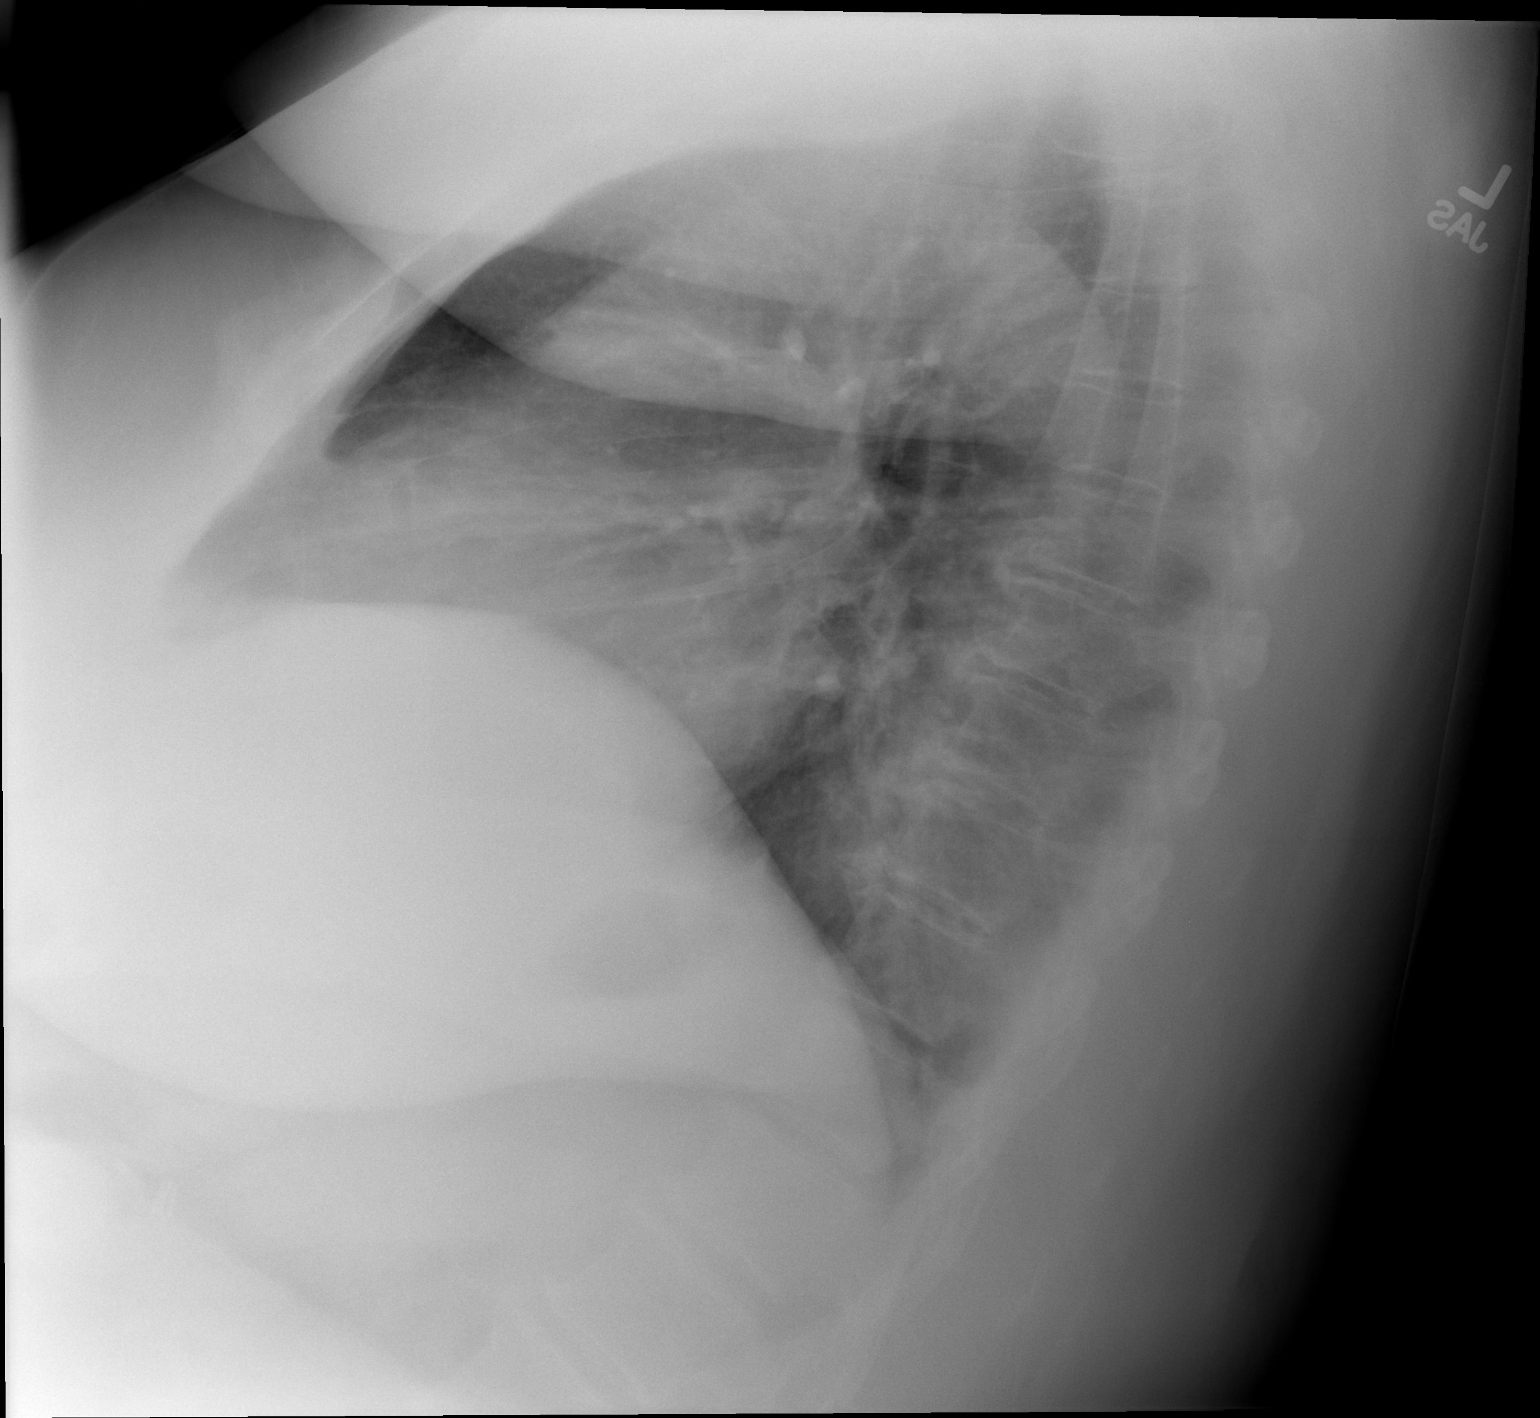

[2 of 2 positions shown; findings below may reference images not displayed]

FINDINGS: Mild cardiomegaly is again noted.
Mild peribronchial thickening is unchanged.
Mild right hemidiaphragm elevation of right hemidiaphragm again
noted.
There is no evidence of focal airspace disease, pulmonary edema,
pulmonary nodule/mass, pleural effusion, or pneumothorax.
No acute bony abnormalities are identified.
IMPRESSION: No evidence of acute cardiopulmonary disease.

Mild cardiomegaly and chronic bronchitic changes.

## 2011-12-27 MED ORDER — HEPARIN BOLUS VIA INFUSION
3000.0000 [IU] | Freq: Once | INTRAVENOUS | Status: AC
  Administered 2011-12-27: 3000 [IU] via INTRAVENOUS
  Filled 2011-12-27: qty 3000

## 2011-12-27 NOTE — Progress Notes (Signed)
ANTICOAGULATION CONSULT NOTE - Follow Up Consult  Pharmacy Consult for heparin Indication: USAP  Labs:  Basename 12/26/11 2324 12/26/11 2103 12/26/11 1432  HGB -- 12.0 12.5  HCT -- 36.6 38.4  PLT -- 165 169  APTT -- 78* 30  LABPROT -- 13.5 12.9  INR -- 1.01 0.95  HEPARINUNFRC <0.10* -- --  CREATININE -- 0.84 0.82  CKTOTAL -- 18 --  CKMB -- 1.2 --  TROPONINI -- <0.30 --    Assessment: 60yo female undetectable on heparin with initial dosing for USAP.  Goal of Therapy:  Heparin level 0.3-0.7 units/ml   Plan:  Will give another heparin bolus of 3000 units x1 and increase gtt by 4 units/kg(ABW)/hr to 1400 units/hr and check level in 6hr.  Colleen Can PharmD BCPS 12/27/2011,1:27 AM

## 2011-12-27 NOTE — Progress Notes (Signed)
Subjective:  Patient complains of the chest pain earlier this morning without associated symptoms. Cardiac enzymes so far are negative. Patient underwent nuclear stress test resting scan today without problems  Objective:  Vital Signs in the last 24 hours: Temp:  [97.4 F (36.3 C)-98 F (36.7 C)] 97.4 F (36.3 C) (07/08 2133) Pulse Rate:  [72-83] 77  (07/08 2133) Resp:  [14-20] 20  (07/08 2133) BP: (84-120)/(46-87) 93/64 mmHg (07/08 2133) SpO2:  [93 %-100 %] 96 % (07/08 2133) Weight:  [151.9 kg (334 lb 14.1 oz)] 151.9 kg (334 lb 14.1 oz) (07/08 1900)  Intake/Output from previous day: 07/08 0701 - 07/09 0700 In: 1540 [P.O.:1440] Out: 1010 [Urine:1000; Emesis/NG output:10] Intake/Output from this shift: Total I/O In: 240 [P.O.:240] Out: -   Physical Exam: Neck: no adenopathy, no carotid bruit, no JVD and supple, symmetrical, trachea midline Lungs: clear to auscultation bilaterally Heart: regular rate and rhythm, S1, S2 normal and Soft systolic murmur noted no S3 gallop Abdomen: soft, non-tender; bowel sounds normal; no masses,  no organomegaly Extremities: extremities normal, atraumatic, no cyanosis or edema  Lab Results:  Basename 12/27/11 0304 12/26/11 2103  WBC 5.1 5.1  HGB 11.9* 12.0  PLT 161 165    Basename 12/27/11 0304 12/26/11 2103  NA 135 134*  K 4.8 3.8  CL 98 97  CO2 31 30  GLUCOSE 106* 144*  BUN 13 11  CREATININE 1.02 0.84    Basename 12/27/11 0805 12/27/11 0304  TROPONINI <0.30 <0.30   Hepatic Function Panel  Basename 12/26/11 2103  PROT 5.9*  ALBUMIN 2.8*  AST 13  ALT 10  ALKPHOS 44  BILITOT 0.2*  BILIDIR --  IBILI --    Basename 12/27/11 0304  CHOL 146   No results found for this basename: PROTIME in the last 72 hours  Imaging: Imaging results have been reviewed and Dg Chest Portable 1 View  12/26/2011  *RADIOLOGY REPORT*  Clinical Data: Chest pain, shortness of breath  PORTABLE CHEST - 1 VIEW  Comparison: 12/17/2011  Findings:  Limited exam because of patient body habitus and portable technique.  Heart is enlarged with vascular congestion and basilar atelectasis.  Decreased lung volumes evident.  No definite focal pneumonia, collapse, consolidation, effusion or pneumothorax.  IMPRESSION: Low volume exam with cardiomegaly and vascular congestion  Original Report Authenticated By: Judie Petit. Ruel Favors, M.D.    Cardiac Studies:  Assessment/Plan:  Atypical chest pain with minor EKGs MI ruled out Hypertension  Hypercholesteremia  Status post A. fib flutter in the past  Hypothyroidism  Morbid obesity  Depression  History of schizoaffective disorder  COPD  GERD Plan Continue present management Schedule for stress portion of nuclear stress test tomorrow  LOS: 1 day    Sura Canul N 12/27/2011, 12:02 PM

## 2011-12-27 NOTE — Progress Notes (Signed)
ANTICOAGULATION CONSULT NOTE - Initial Consult  Pharmacy Consult for Heparin Indication: USAP  Allergies  Allergen Reactions  . Sulfamethoxazole W-Trimethoprim Hives and Nausea And Vomiting  . Penicillins Hives    Patient Measurements: Height: 5' 0.1" (152.7 cm) Weight: 334 lb 14.1 oz (151.9 kg) IBW/kg (Calculated) : 45.73  Heparin Dosing Weight: 88kg  Vital Signs:    Labs:  Basename 12/27/11 0805 12/27/11 0304 12/26/11 2324 12/26/11 2103 12/26/11 1432  HGB -- 11.9* -- 12.0 --  HCT -- 37.4 -- 36.6 38.4  PLT -- 161 -- 165 169  APTT -- -- -- 78* 30  LABPROT -- -- -- 13.5 12.9  INR -- -- -- 1.01 0.95  HEPARINUNFRC 0.47 -- <0.10* -- --  CREATININE -- 1.02 -- 0.84 0.82  CKTOTAL 22 24 -- 18 --  CKMB 1.5 1.5 -- 1.2 --  TROPONINI <0.30 <0.30 -- <0.30 --    Estimated Creatinine Clearance: 83.7 ml/min (by C-G formula based on Cr of 1.02).   Medical History: Past Medical History  Diagnosis Date  . Schizoaffective disorder   . Urinary tract infection   . Hypertension   . Atrial fibrillation   . GERD (gastroesophageal reflux disease)   . Hypothyroidism   . Myocardial infarction   . Diabetes mellitus   . Obesity   . Asthma   . COPD (chronic obstructive pulmonary disease)   . Pneumonia   . CHF (congestive heart failure)     Medications:  Prescriptions prior to admission  Medication Sig Dispense Refill  . albuterol (PROVENTIL HFA;VENTOLIN HFA) 108 (90 BASE) MCG/ACT inhaler Inhale 1 puff into the lungs every 6 (six) hours as needed. For wheezing      . amLODipine (NORVASC) 5 MG tablet Take 5 mg by mouth daily.      Marland Kitchen aspirin EC 81 MG tablet Take 81 mg by mouth every morning.       . chlorproMAZINE (THORAZINE) 50 MG tablet Take 50-200 mg by mouth 3 (three) times daily. Take 1 tablet (50MG ) twice daily and 4 tablets (200MG ) at bedtime.      . divalproex (DEPAKOTE ER) 500 MG 24 hr tablet Take 1,000 mg by mouth 2 (two) times daily.      . fluticasone (FLONASE) 50 MCG/ACT  nasal spray Place 2 sprays into the nose daily as needed. For nasal congestion      . folic acid (FOLVITE) 1 MG tablet Take 1 mg by mouth every morning.       Marland Kitchen levothyroxine (SYNTHROID, LEVOTHROID) 150 MCG tablet Take 150 mcg by mouth daily.      Marland Kitchen lisinopril (PRINIVIL,ZESTRIL) 40 MG tablet Take 40 mg by mouth every 12 (twelve) hours.       . metoprolol succinate (TOPROL-XL) 50 MG 24 hr tablet Take 50 mg by mouth daily. Take with or immediately following a meal.      . omeprazole (PRILOSEC) 20 MG capsule Take 20 mg by mouth daily.       Marland Kitchen oxyCODONE-acetaminophen (PERCOCET) 5-325 MG per tablet Take 2 tablets by mouth every 4 (four) hours as needed for pain.  15 tablet  0  . paliperidone ([name]) 6 MG 24 hr tablet Take 6 mg by mouth 2 (two) times daily.        Assessment: 61 y/o female patient admitted with chest pain on heparin for r/o MI. Cardiac enzymes negative x3, EKG with T wave abnormalities. Heparin level therapeutic this am (= 0.47) after additional bolus and dose increase H/H/Plts stable, no  bleeding per chart.  Goal of Therapy:  Heparin level 0.3-0.7 units/ml Monitor platelets by anticoagulation protocol: Yes   Plan:  - Continue heparin infusion 1400 units/hr - f/u treatment plans, and daily heparin level and CBC  Bayard Hugger, PharmD, BCPS  Clinical Pharmacist  Pager: [phone]   12/27/2011,10:32 AM

## 2011-12-28 ENCOUNTER — Inpatient Hospital Stay (HOSPITAL_COMMUNITY): Payer: Medicare Other

## 2011-12-28 ENCOUNTER — Encounter (HOSPITAL_COMMUNITY): Payer: Self-pay | Admitting: General Practice

## 2011-12-28 LAB — CBC
Hemoglobin: 12.7 g/dL (ref 12.0–15.0)
MCH: 33.5 pg (ref 26.0–34.0)
MCHC: 31.9 g/dL (ref 30.0–36.0)
RDW: 13.6 % (ref 11.5–15.5)

## 2011-12-28 LAB — HEPARIN LEVEL (UNFRACTIONATED): Heparin Unfractionated: 0.28 IU/mL — ABNORMAL LOW (ref 0.30–0.70)

## 2011-12-28 MED ORDER — TECHNETIUM TC 99M TETROFOSMIN IV KIT
30.0000 | PACK | Freq: Once | INTRAVENOUS | Status: AC | PRN
  Administered 2011-12-28: 30 via INTRAVENOUS

## 2011-12-28 MED ORDER — REGADENOSON 0.4 MG/5ML IV SOLN
0.4000 mg | Freq: Once | INTRAVENOUS | Status: AC
  Administered 2011-12-28: 0.4 mg via INTRAVENOUS
  Filled 2011-12-28: qty 5

## 2011-12-28 NOTE — Progress Notes (Signed)
ANTICOAGULATION CONSULT NOTE - Initial Consult  Pharmacy Consult for Heparin Indication: USAP  Allergies  Allergen Reactions  . Sulfamethoxazole W-Trimethoprim Hives and Nausea And Vomiting  . Penicillins Hives    Patient Measurements: Height: 5' 0.1" (152.7 cm) Weight: 345 lb 3.9 oz (156.6 kg) (bed scale) IBW/kg (Calculated) : 45.73  Heparin Dosing Weight: 88kg  Vital Signs: Temp: 97.8 F (36.6 C) (07/10 0500) Temp src: Oral (07/10 0500) BP: 132/83 mmHg (07/10 0500) Pulse Rate: 89  (07/10 0500)  Labs:  Basename 12/28/11 0540 12/27/11 0805 12/27/11 0304 12/26/11 2324 12/26/11 2103 12/26/11 1432  HGB 12.7 -- 11.9* -- -- --  HCT 39.8 -- 37.4 -- 36.6 --  PLT 164 -- 161 -- 165 --  APTT -- -- -- -- 78* 30  LABPROT -- -- -- -- 13.5 12.9  INR -- -- -- -- 1.01 0.95  HEPARINUNFRC 0.28* 0.47 -- <0.10* -- --  CREATININE -- -- 1.02 -- 0.84 0.82  CKTOTAL -- 22 24 -- 18 --  CKMB -- 1.5 1.5 -- 1.2 --  TROPONINI -- <0.30 <0.30 -- <0.30 --    Estimated Creatinine Clearance: 85.5 ml/min (by C-G formula based on Cr of 1.02).   Medical History: Past Medical History  Diagnosis Date  . Schizoaffective disorder   . Urinary tract infection   . Hypertension   . Atrial fibrillation   . GERD (gastroesophageal reflux disease)   . Hypothyroidism   . Myocardial infarction   . Diabetes mellitus   . Obesity   . Asthma   . COPD (chronic obstructive pulmonary disease)   . Pneumonia   . CHF (congestive heart failure)     Medications:  Prescriptions prior to admission  Medication Sig Dispense Refill  . albuterol (PROVENTIL HFA;VENTOLIN HFA) 108 (90 BASE) MCG/ACT inhaler Inhale 1 puff into the lungs every 6 (six) hours as needed. For wheezing      . amLODipine (NORVASC) 5 MG tablet Take 5 mg by mouth daily.      Marland Kitchen aspirin EC 81 MG tablet Take 81 mg by mouth every morning.       . chlorproMAZINE (THORAZINE) 50 MG tablet Take 50-200 mg by mouth 3 (three) times daily. Take 1 tablet (50MG )  twice daily and 4 tablets (200MG ) at bedtime.      . divalproex (DEPAKOTE ER) 500 MG 24 hr tablet Take 1,000 mg by mouth 2 (two) times daily.      . fluticasone (FLONASE) 50 MCG/ACT nasal spray Place 2 sprays into the nose daily as needed. For nasal congestion      . folic acid (FOLVITE) 1 MG tablet Take 1 mg by mouth every morning.       Marland Kitchen levothyroxine (SYNTHROID, LEVOTHROID) 150 MCG tablet Take 150 mcg by mouth daily.      Marland Kitchen lisinopril (PRINIVIL,ZESTRIL) 40 MG tablet Take 40 mg by mouth every 12 (twelve) hours.       . metoprolol succinate (TOPROL-XL) 50 MG 24 hr tablet Take 50 mg by mouth daily. Take with or immediately following a meal.      . omeprazole (PRILOSEC) 20 MG capsule Take 20 mg by mouth daily.       Marland Kitchen oxyCODONE-acetaminophen (PERCOCET) 5-325 MG per tablet Take 2 tablets by mouth every 4 (four) hours as needed for pain.  15 tablet  0  . paliperidone ([name]) 6 MG 24 hr tablet Take 6 mg by mouth 2 (two) times daily.        Assessment: 61 y/o  female patient admitted with chest pain on heparin for r/o MI. Heparin level slightly subtherapeutic this am (= 0.28) H/H/Plts stable, no bleeding per chart, plan for stress test today   Goal of Therapy:  Heparin level 0.3-0.7 units/ml Monitor platelets by anticoagulation protocol: Yes   Plan:  - Increase heparin rate slightly to 1500 units/hr - F/u treatment plans after stress test - F/u daily heparin level and CBC  Bayard Hugger, PharmD, BCPS  Clinical Pharmacist  Pager: [phone]   12/28/2011,8:30 AM

## 2011-12-28 NOTE — Progress Notes (Signed)
Pt. Discharged 12/28/2011  6:50 PM Discharge instructions reviewed with patient/family. Patient/family verbalized understanding. All Rx's given. Questions answered as needed. Pt. Discharged to home with fiance. [facility] RN to be finalized tomorrow. The on call Case Manager Junius Creamer spoke with patient before discharge.   Aayana Reinertsen, Chrystine Oiler

## 2011-12-28 NOTE — Discharge Summary (Signed)
  Discharge summary dictated on 710 13 dictation number is [phone]

## 2011-12-28 NOTE — Care Management Note (Signed)
    Page 1 of 1   12/29/2011     8:26:34 AM   CARE MANAGEMENT NOTE 12/29/2011  Patient:  [name], [name]   Account Number:  [id number]  Date Initiated:  12/28/2011  Documentation initiated by:  AMERSON,JULIE  Subjective/Objective Assessment:   PT ADM WITH CHEST PAIN.  PTA, PT INDEPENDENT, LIVES AT [address], ADULT RESIDENTIAL HOUSING.     Action/Plan:   MET WITH PT TO DISCUSS DC PLANNING.  PT STATES SHE HAS A ROOMMATE AND IS ABLE TO GET MEDS FILLED WITHOUT DIFFICULTY. PRIMARY PHYSICIAN IS Julieanne Manson, MD.  SHE DENIES ANY HOME NEEDS PRESENTLY.   Anticipated DC Date:  12/28/2011   Anticipated DC Plan:  HOME W HOME HEALTH SERVICES      DC Planning Services  CM consult      [facility] Choice  HOME HEALTH   Choice offered to / List presented to:  C-1 Patient        HH arranged  HH-1 RN  HH-4 NURSE'S AIDE      Status of service:  Completed, signed off Medicare Important Message given?   (If response is "NO", the following Medicare IM given date fields will be blank) Date Medicare IM given:   Date Additional Medicare IM given:    Discharge Disposition:  HOME W HOME HEALTH SERVICES  Per UR Regulation:    If discussed at Long Length of Stay Meetings, dates discussed:    Comments:  7/11 8:24a debbie Rebecah Dangerfield rn,bsn recieved call last pm that pt dc w [facility] . spoke w pt by phone and went over [facility] list. [facility]. [facility] this am for rn and aid.copy of [facility] list on chart.

## 2011-12-29 NOTE — Progress Notes (Signed)
HOME HEALTH AGENCIES SERVING [place]   Agencies that are Medicare-Certified and are affiliated with [facility]  Telephone Number Address  [facility]   [facility] has ownership interest in this company; however, you are under no obligation to use this agency. [phone] or  [phone] [address] [place], [place] [address]   Agencies that are Medicare-Certified and are not affiliated with [facility]                                                                                 Home Health Agency Telephone Number Address  [facility] [phone] Fax [phone] [address], [address] [place], [place]  [address]  [facility] [phone] or [phone] Fax [phone] [address] [place], [place] [address]  [facility] [phone] Fax [phone] [address] [place], [place] [address]  [facility] [phone] Fax [phone] [address] [place], [place]  [address]  [facility] [phone] Fax [phone] [address], [address] [place], [place] [address]  [facility] [phone] [address] [place], [place] [address]  [facility] [phone]  [address] [place], [place] [address]  [facility] [phone] or [phone] Fax [phone] [address] [place], [place]  [address]  [facility] [phone] Fax [phone] [address] [place], [place]  [address]  [facility]  [phone] Fax [phone] [address] [place],  [address]      

## 2011-12-29 NOTE — Discharge Summary (Signed)
[name], [name]               ACCOUNT NO.:  [id number]  MEDICAL RECORD NO.:  [id number]  LOCATION:  2021                         FACILITY:  [facility]  PHYSICIAN:  Eduardo Osier. Sharyn Lull, M.D. DATE OF BIRTH:  Apr 24, 1951  DATE OF ADMISSION:  12/26/2011 DATE OF DISCHARGE:  12/28/2011                              DISCHARGE SUMMARY   ADMITTING DIAGNOSES: 1. Recurrent chest pain rule out myocardial infarction. 2. Hypertension. 3. Hypercholesteremia. 4. History of atrial fibrillation and flutter in the past. 5. Hypothyroidism. 6. Morbid obesity. 7. Depression. 8. History of schizoaffective disorder. 9. Chronic obstructive pulmonary disease. 10.Gastroesophageal reflux disease.  DISCHARGE DIAGNOSES: 1. Status post atypical chest pain, myocardial infarction ruled out,     negative Lexiscan Myoview. 2. Hypertension. 3. Hypercholesteremia. 4. Status post atrial fibrillation and flutter in the past. 5. Hypothyroidism. 6. Morbid obesity. 7. Depression. 8. History of schizoaffective disorder. 9. Chronic obstructive pulmonary disease. 10.Gastroesophageal reflux disease.  DISCHARGE HOME MEDICATIONS: 1. Albuterol inhaler 1 puff every 6 hours as needed as before. 2. Enteric-coated aspirin 81 mg 1 tablet daily. 3. Thorazine 1 tablet twice daily and 4 tablets at bedtime as before. 4. Depakote ER 1000 mg twice daily as before. 5. Flonase 2 sprays daily as needed as before. 6. Folic acid 1 mg 1 tablet daily. 7. Levothyroxine 150 mcg 1 tablet daily as before. 8. Lisinopril 40 mg twice daily as before. 9. Toprol-XL 50 mg daily as before. 10.Omeprazole 20 mg 1 tablet daily as before. 11.[name] 6 mg by mouth twice daily as before.  The patient has been     advised to stop amlodipine, oxycodone, and acetaminophen.  DIET:  Low salt, low cholesterol.  ACTIVITY:  Increase activity as tolerated.  Follow up with me in 1 week.  CONDITION AT DISCHARGE:  Stable.  BRIEF HISTORY AND HOSPITAL COURSE:   [name] is 61 year old female with past medical history significant for multiple medical problems, i.e. hypertension, hypercholesteremia, history of AFib and flutter in the past, hypothyroidism, morbid obesity, depression, COPD, history of congestive heart failure secondary to diastolic dysfunction, history of schizoaffective disorder in the past.  She came to the ER complaining of recurrent retrosternal chest pain described as sharp/squeezing radiating to right arm and left arm associated with nausea, vomiting, and shortness of breath.  The patient denies any palpitation, lightheadedness, or syncope, also complains of shortness of breath with minimal exertion.  She denies history of PND or orthopnea, but complains of leg swelling.  The patient had extensive cardiac workup in the past and left cath in 2007, which showed mild coronary artery disease. Subsequently, had nuclear stress test x2, which were negative for ischemia.  EKG done in the ER showed normal sinus rhythm with minor T- wave changes in anteroseptal leads.  PAST MEDICAL HISTORY:  As above.  PAST SURGICAL HISTORY:  She had tubal ligation in the past.  Had abdominal hysterectomy in the past.  SOCIAL HISTORY:  No history of alcohol abuse.  Used to smoke in the past, but quit many years ago.  PHYSICAL EXAMINATION:  GENERAL:  She was alert, awake, and oriented x3. VITAL SIGNS:  Blood pressure was 102/53, pulse was 72, she was afebrile. HEENT:  Conjunctivae was pink. NECK:  Supple.  No JVD.  No bruit. LUNGS:  Clear to auscultation without rhonchi or rales. CARDIOVASCULAR EXAM:  S1, S2 was normal.  There was soft systolic murmur.  No S3 gallop was noted. ABDOMEN:  Soft, obese.  Bowel sounds were present, nontender. EXTREMITIES:  There was no clubbing, cyanosis, or edema.  LABORATORY DATA:  Her sodium was 135, potassium 4.2, BUN 11, creatinine 0.82, glucose was 108.  Three sets of cardiac enzymes were  negative. Cholesterol was 146, LDL 88, HDL 44, triglycerides were 70.  Hemoglobin was 12, hematocrit 36.6, white count of 5.1 with no shift to the left. Her TSH was 1.690.  Her nuclear stress test showed reduced sensitivity and specificity due to motion artifact.  No obvious inducible ischemia noted, EF of 47%.  BRIEF HOSPITAL COURSE:  The patient was admitted to telemetry unit.  MI was ruled out by serial enzymes and EKG.  The patient did not had any anginal chest pain during the hospital stay.  The patient subsequently underwent nuclear stress test 2-day protocol, which showed no evidence of ischemia with EF of 47% as above.  The patient had episode of hyperpyrexia, temp of 101.7 earlier today.  Repeat temperature today is 98.7.  The patient denies any cough, fever, or chills.  Denies any urinary complaints.  Her urinalysis is completely negative.  Chest x-ray was normal.  The patient will be discharged home on above medications and will be followed up in my office in 1 week.  The patient will follow up with PMD as scheduled and will discuss with them regarding sleep studies as outpatient.     Eduardo Osier. Sharyn Lull, M.D.     MNH/MEDQ  D:  12/28/2011  T:  12/29/2011  Job:  [id number]

## 2012-01-02 ENCOUNTER — Emergency Department (HOSPITAL_COMMUNITY): Payer: Medicare Other

## 2012-01-02 ENCOUNTER — Emergency Department (HOSPITAL_COMMUNITY)
Admission: EM | Admit: 2012-01-02 | Discharge: 2012-01-02 | Disposition: A | Discharge status: Home / Self Care | Payer: Medicare Other | Attending: Emergency Medicine | Admitting: Emergency Medicine

## 2012-01-02 DIAGNOSIS — E039 Hypothyroidism, unspecified: Secondary | ICD-10-CM | POA: Insufficient documentation

## 2012-01-02 DIAGNOSIS — I10 Essential (primary) hypertension: Secondary | ICD-10-CM | POA: Insufficient documentation

## 2012-01-02 DIAGNOSIS — J4489 Other specified chronic obstructive pulmonary disease: Secondary | ICD-10-CM | POA: Insufficient documentation

## 2012-01-02 DIAGNOSIS — I509 Heart failure, unspecified: Secondary | ICD-10-CM | POA: Insufficient documentation

## 2012-01-02 DIAGNOSIS — Z79899 Other long term (current) drug therapy: Secondary | ICD-10-CM | POA: Insufficient documentation

## 2012-01-02 DIAGNOSIS — J449 Chronic obstructive pulmonary disease, unspecified: Secondary | ICD-10-CM | POA: Insufficient documentation

## 2012-01-02 DIAGNOSIS — R079 Chest pain, unspecified: Secondary | ICD-10-CM | POA: Insufficient documentation

## 2012-01-02 DIAGNOSIS — I4891 Unspecified atrial fibrillation: Secondary | ICD-10-CM | POA: Insufficient documentation

## 2012-01-02 DIAGNOSIS — K219 Gastro-esophageal reflux disease without esophagitis: Secondary | ICD-10-CM | POA: Insufficient documentation

## 2012-01-02 DIAGNOSIS — Z7982 Long term (current) use of aspirin: Secondary | ICD-10-CM | POA: Insufficient documentation

## 2012-01-02 LAB — D-DIMER, QUANTITATIVE: D-Dimer, Quant: 0.57 ug/mL-FEU — ABNORMAL HIGH (ref 0.00–0.48)

## 2012-01-02 MED ORDER — HYDROCODONE-ACETAMINOPHEN 5-325 MG PO TABS: 1.0000 | ORAL_TABLET | Freq: Four times a day (QID) | ORAL | Status: DC | PRN

## 2012-01-02 MED ORDER — MORPHINE SULFATE 4 MG/ML IJ SOLN: 4.0000 mg | Freq: Once | INTRAMUSCULAR | Status: DC

## 2012-01-02 MED ORDER — IOHEXOL 350 MG/ML SOLN
100.0000 mL | Freq: Once | INTRAVENOUS | Status: AC | PRN
  Administered 2012-01-02: 100 mL via INTRAVENOUS

## 2012-01-02 MED ORDER — LORAZEPAM 2 MG/ML IJ SOLN
1.0000 mg | Freq: Once | INTRAMUSCULAR | Status: AC
  Administered 2012-01-02: 1 mg via INTRAVENOUS
  Filled 2012-01-02: qty 1

## 2012-01-02 MED ORDER — GI COCKTAIL ~~LOC~~
30.0000 mL | Freq: Once | ORAL | Status: AC
  Administered 2012-01-02: 30 mL via ORAL
  Filled 2012-01-02: qty 30

## 2012-01-02 NOTE — ED Provider Notes (Signed)
I saw and evaluated the patient, reviewed the resident's note and I agree with the findings and plan. I saw and evaluated the patient's EKG and agree with the resident's interpretation. Patient with diffuse complaints of upper abdominal pain and atypical chest pain. Patient was recently hospitalized and underwent a cardiac evaluation which was negative. Symptoms today do not suggest cardiac cause. D-dimer was elevated but CT of the chest was negative. On reevaluation patient has normal O2 sats and normal pulse and blood pressure. Discussed in length about followup with PCP or patient and family are comfortable with the plan to  Gwyneth Sprout, MD 01/02/12 2322

## 2012-01-02 NOTE — ED Notes (Signed)
Patient transported from X-ray 

## 2012-01-02 NOTE — ED Notes (Signed)
Pt has been here multiple times for same n/v/d abd pain and chest pain also having sob

## 2012-01-02 NOTE — ED Notes (Signed)
Patient transported to X-ray 

## 2012-01-02 NOTE — ED Notes (Signed)
Pt undressed and in gown (by [facility]); pt placed on monitor, continuous pulse oximetry, blood pressure cuff and oxygen [place] (2L); EKG performed

## 2012-01-02 NOTE — ED Provider Notes (Signed)
History     CSN: [id number]  Arrival date & time 01/02/12  1436   First MD Initiated Contact with Patient 01/02/12 1459      Chief Complaint  Patient presents with  . Abdominal Pain    (Consider location/radiation/quality/duration/timing/severity/associated sxs/prior treatment) Patient is a 61 y.o. female presenting with chest pain. The history is provided by the patient and medical records.  Chest Pain The chest pain began more than 2 weeks ago. Chest pain occurs constantly. The chest pain is unchanged. The severity of the pain is severe. The quality of the pain is described as similar to previous episodes and squeezing. The pain radiates to the upper back, left shoulder and right shoulder. Primary symptoms include a fever, shortness of breath, cough, abdominal pain, nausea and vomiting. Pertinent negatives for primary symptoms include no palpitations.  The fever began today. The maximum temperature recorded prior to her arrival was unknown (subjective).  The shortness of breath began more than 2 days ago. Onset quality: lying flat. The patient's medical history is significant for CHF.  The abdominal pain began more than 2 days ago. The abdominal pain is generalized.  Vomiting occurs 2 to 5 times per day. The emesis contains stomach contents.  Associated symptoms include lower extremity edema.  Pertinent negatives for associated symptoms include no claudication, no diaphoresis and no near-syncope. She tried nothing for the symptoms. Risk factors include obesity, lack of exercise and sedentary lifestyle.  Her past medical history is significant for COPD, CHF and diabetes. Past medical history comments: schizoaffective disorder  Procedure history is positive for cardiac catheterization. Procedure history comments: Nuc Med study 7/9- no signs of ischemia.     Past Medical History  Diagnosis Date  . Schizoaffective disorder   . Urinary tract infection   . Hypertension   . Atrial  fibrillation   . GERD (gastroesophageal reflux disease)   . Hypothyroidism   . Myocardial infarction   . Diabetes mellitus   . Obesity   . Asthma   . COPD (chronic obstructive pulmonary disease)   . Pneumonia   . CHF (congestive heart failure)     Past Surgical History  Procedure Date  . Tubal ligation   . Abdominal hysterectomy     No family history on file.  History  Substance Use Topics  . Smoking status: Former Smoker -- 0.0 packs/day    Quit date: 06/29/2000  . Smokeless tobacco: Current User    Types: Snuff, Chew  . Alcohol Use: No    OB History    Grav Para Term Preterm Abortions TAB SAB Ect Mult Living                  Review of Systems  Constitutional: Positive for fever. Negative for diaphoresis.  HENT: Positive for congestion and rhinorrhea.   Respiratory: Positive for cough and shortness of breath.   Cardiovascular: Positive for chest pain and leg swelling. Negative for palpitations, claudication and near-syncope.  Gastrointestinal: Positive for nausea, vomiting, abdominal pain and diarrhea.  Genitourinary: Negative for dysuria.  Musculoskeletal: Positive for back pain.  Skin: Negative for rash.  Neurological: Positive for headaches.  Psychiatric/Behavioral: The patient is nervous/anxious.   All other systems reviewed and are negative.    Allergies  Sulfamethoxazole w-trimethoprim and Penicillins  Home Medications   Current Outpatient Rx  Name Route Sig Dispense Refill  . ACETAMINOPHEN 325 MG PO TABS Oral Take 650 mg by mouth every 6 (six) hours as needed. For pain    .  ALBUTEROL SULFATE HFA 108 (90 BASE) MCG/ACT IN AERS Inhalation Inhale 1 puff into the lungs every 6 (six) hours as needed. For wheezing    . ASPIRIN EC 81 MG PO TBEC Oral Take 81 mg by mouth every morning.     Marland Kitchen CHLORPROMAZINE HCL 50 MG PO TABS Oral Take 50-200 mg by mouth 3 (three) times daily. Take 1 tablet (50MG ) twice daily and 4 tablets (200MG ) at bedtime.    Marland Kitchen DIVALPROEX  SODIUM ER 500 MG PO TB24 Oral Take 1,000 mg by mouth 2 (two) times daily.    Marland Kitchen FLUTICASONE PROPIONATE 50 MCG/ACT NA SUSP Nasal Place 2 sprays into the nose daily as needed. For nasal congestion    . FOLIC ACID 1 MG PO TABS Oral Take 1 mg by mouth every morning.     Marland Kitchen LEVOTHYROXINE SODIUM 150 MCG PO TABS Oral Take 150 mcg by mouth daily.    Marland Kitchen LISINOPRIL 40 MG PO TABS Oral Take 40 mg by mouth every 12 (twelve) hours.     Marland Kitchen METOPROLOL SUCCINATE ER 50 MG PO TB24 Oral Take 50 mg by mouth daily. Take with or immediately following a meal.    . OMEPRAZOLE 20 MG PO CPDR Oral Take 20 mg by mouth daily.     Marland Kitchen PALIPERIDONE ER 6 MG PO TB24 Oral Take 6 mg by mouth 2 (two) times daily.      BP 102/58  Pulse 116  Temp 97.9 F (36.6 C) (Oral)  Resp 16  SpO2 99%  Physical Exam  Nursing note and vitals reviewed. Constitutional: She is oriented to person, place, and time. She appears well-developed and well-nourished.       Morbidly obese  HENT:  Head: Normocephalic and atraumatic.  Eyes: Pupils are equal, round, and reactive to light.  Cardiovascular: Normal rate, regular rhythm, normal heart sounds and intact distal pulses.   Pulmonary/Chest: Effort normal and breath sounds normal. No respiratory distress. She exhibits tenderness (mild, diffuse).  Abdominal: Soft. Bowel sounds are normal. She exhibits no distension. There is no tenderness.  Musculoskeletal: She exhibits edema (trace to 1+ bilateral feet; morbidly obese legs).  Neurological: She is alert and oriented to person, place, and time.  Skin: Skin is warm and dry.  Psychiatric: Her mood appears anxious.    ED Course  Procedures (including critical care time)  Labs Reviewed  D-DIMER, QUANTITATIVE - Abnormal; Notable for the following:    D-Dimer, Quant 0.57 (*)     All other components within normal limits   Dg Chest 2 View  01/02/2012  *RADIOLOGY REPORT*  Clinical Data: Left side chest pain, heart attack symptoms, history asthma,  COPD, CHF, diabetes, hypertension  CHEST - 2 VIEW  Comparison: 12/26/2011  Findings: Enlargement of cardiac silhouette with pulmonary vascular congestion. Tortuous aorta with atherosclerotic calcification at arch. Decreased lung volumes with crowding of perihilar markings. No definite acute infiltrate, pleural effusion or pneumothorax. Minimal right basilar atelectasis. No acute osseous findings.  IMPRESSION: Enlargement of cardiac silhouette with pulmonary vascular congestion. Low lung volumes with minimal right basilar atelectasis.  Original Report Authenticated By: Lollie Marrow, M.D.   Ct Angio Chest W/cm &/or Wo Cm  01/02/2012  *RADIOLOGY REPORT*  Clinical Data: Chest pain and shortness of breath.  CT ANGIOGRAPHY CHEST  Technique:  Multidetector CT imaging of the chest using the standard protocol during bolus administration of intravenous contrast. Multiplanar reconstructed images including MIPs were obtained and reviewed to evaluate the vascular anatomy.  Contrast: OMNIPAQUE  IOHEXOL 350 MG/ML SOLN  Comparison: Chest CT 04/16/2008.  Findings:  Mediastinum: Study is limited by a large amount of patient motion, image noise from the patient's large body habitus, and suboptimal contrast bolus.  Within these limitations, there is no evidence of central, lobar or proximal segmental sized pulmonary embolism. Unfortunately, more distal segmental and subsegmental sized embolism cannot be completely excluded on this examination. Heart size is mildly enlarged. There is no significant pericardial fluid, thickening or pericardial calcification. No pathologically enlarged mediastinal or hilar lymph nodes. Esophagus is unremarkable in appearance.  Lungs/Pleura: No consolidative airspace disease.  No pleural effusions.  The no definite suspicious appearing pulmonary nodules or masses are identified.  Upper Abdomen: Unremarkable.  Musculoskeletal: There are no aggressive appearing lytic or blastic lesions noted in the  visualized portions of the skeleton.  IMPRESSION: 1.  Limited examination demonstrating no evidence of central, lobar or proximal segmental sized pulmonary embolism. 2.  Mild cardiomegaly.  Original Report Authenticated By: Florencia Reasons, M.D.     1. Nonspecific chest pain      Date: 01/02/2012  Rate: 116  Rhythm: sinus tachycardia  QRS Axis: right  Intervals: normal  ST/T Wave abnormalities: normal  Conduction Disutrbances:none  Narrative Interpretation:   Old EKG Reviewed: unchanged    MDM   This is a 61 year old female who presents here today with multiple complaints. Of note she has been seen here multiple times recently for similar complaints. The patient's primary complaint is of chest pain, shortness of breath, nausea, vomiting, diarrhea all of which has been going on for several weeks. She states that the symptoms are the exact same as they have in the past several weeks, and remain unchanged. The chest pain is midsternal, constant and occasionally has pain in her bilateral shoulders. The shortness of breath is primarily at night when she feels like she cannot lay flat due to difficulty breathing, and occasionally feels like she has to catch her breath. She says she was recently admitted and discharged home and has not felt any better since then. The above complaints are the only ones patient provides, however is pan positive on her review of systems- answers yes to every question asked. Upon first walking into the room, the patient's initial complaint is that she needs to be admitted to the hospital again. The patient does appear somewhat anxious among but her exam is relatively benign. The patient is intentionally breathing heavily on auscultation of her lungs, however her lungs are clear bilaterally. With palpation of her abdomen, she is endorsing pain everywhere, however when auscultating her abdomen is not endorsing any pain even with significant pressure of the stethoscope;  she has no rebound or guarding with palpation. She is endorsing significant swelling of her lower extremities, which is difficult to assess due to the morbid obesity of her lower legs, but she is only noted to have trace edema bilaterally. She is tachycardic at this time, however when she gets more anxious about this her heart rate is noted to increase further. Review of records show that patient has been seen multiple times over the past 6 weeks for similar complaints. The complaint of abdominal pain is sometimes present and sometimes not. She was admitted from the 8th through the 10th of this month for evaluation of recurrent chest pain, and a nuclear medicine study did not show any signs of inducible ischemia. Her workup thus far has otherwise been negative, including chest x-rays, EKGs and lab work including d-dimer. Given the  patient's multiple recent presentations for the same was otherwise negative workup, unremarkable exam will get a chest x-ray due to her complaints of shortness of breath, as well as a d-dimer due to her new tachycardia. Do not think she needs further cardiac workup at this point in time, nor any further workup for her reported abdominal pain.  Patient's d-dimer is mildly elevated, however there are no abnormal findings on the CT angiography chest. Discussed with the patient the fact that given her recent negative workup, negative workup I do not feel that she needs to be admitted. She has followup arranged with her cardiologist as well as with her regular doctor and discussed with the patient the importance of close followup with them, as well as discussing with them any additional concern she has about her symptoms. Discussed indications for return, and patient expressed understanding with this.   Theotis Burrow, MD 01/02/12 [phone]

## 2012-01-04 ENCOUNTER — Emergency Department (HOSPITAL_COMMUNITY)
Admission: EM | Admit: 2012-01-04 | Discharge: 2012-01-04 | Disposition: A | Discharge status: Home / Self Care | Payer: Medicare Other | Attending: Emergency Medicine | Admitting: Emergency Medicine

## 2012-01-04 ENCOUNTER — Emergency Department (HOSPITAL_COMMUNITY): Payer: Medicare Other

## 2012-01-04 ENCOUNTER — Encounter (HOSPITAL_COMMUNITY): Payer: Self-pay | Admitting: *Deleted

## 2012-01-04 DIAGNOSIS — F259 Schizoaffective disorder, unspecified: Secondary | ICD-10-CM | POA: Insufficient documentation

## 2012-01-04 DIAGNOSIS — I10 Essential (primary) hypertension: Secondary | ICD-10-CM | POA: Insufficient documentation

## 2012-01-04 DIAGNOSIS — I4891 Unspecified atrial fibrillation: Secondary | ICD-10-CM | POA: Insufficient documentation

## 2012-01-04 DIAGNOSIS — I252 Old myocardial infarction: Secondary | ICD-10-CM | POA: Insufficient documentation

## 2012-01-04 DIAGNOSIS — J4489 Other specified chronic obstructive pulmonary disease: Secondary | ICD-10-CM | POA: Insufficient documentation

## 2012-01-04 DIAGNOSIS — E119 Type 2 diabetes mellitus without complications: Secondary | ICD-10-CM | POA: Insufficient documentation

## 2012-01-04 DIAGNOSIS — F250 Schizoaffective disorder, bipolar type: Secondary | ICD-10-CM

## 2012-01-04 DIAGNOSIS — R079 Chest pain, unspecified: Secondary | ICD-10-CM

## 2012-01-04 DIAGNOSIS — I509 Heart failure, unspecified: Secondary | ICD-10-CM | POA: Insufficient documentation

## 2012-01-04 DIAGNOSIS — Z87891 Personal history of nicotine dependence: Secondary | ICD-10-CM | POA: Insufficient documentation

## 2012-01-04 DIAGNOSIS — J449 Chronic obstructive pulmonary disease, unspecified: Secondary | ICD-10-CM | POA: Insufficient documentation

## 2012-01-04 DIAGNOSIS — Z7982 Long term (current) use of aspirin: Secondary | ICD-10-CM | POA: Insufficient documentation

## 2012-01-04 NOTE — ED Notes (Signed)
Attempted to locate pt husband, no response in lobby.

## 2012-01-04 NOTE — ED Notes (Signed)
Pt in via EMS- c/o substernal chest pain since 2 pm today, denies radiation, also nausea, pt NSR for EMS

## 2012-01-04 NOTE — ED Provider Notes (Signed)
History     CSN: [id number]  Arrival date & time 01/04/12  1624   First MD Initiated Contact with Patient 01/04/12 1643      Chief Complaint  Patient presents with  . Chest Pain    (Consider location/radiation/quality/duration/timing/severity/associated sxs/prior treatment) Patient is a 61 y.o. female presenting with chest pain. The history is provided by the patient.  Chest Pain The chest pain began more than 2 weeks ago. Chest pain occurs constantly. The chest pain is unchanged. The severity of the pain is severe. The quality of the pain is described as similar to previous episodes and squeezing. The pain does not radiate. Primary symptoms include a fever, shortness of breath, abdominal pain, nausea and vomiting. Pertinent negatives for primary symptoms include no cough and no palpitations.  Pertinent negatives for associated symptoms include no diaphoresis, no near-syncope and no numbness. She tried nothing for the symptoms. Risk factors include obesity, lack of exercise and sedentary lifestyle.  Her past medical history is significant for COPD, CHF and diabetes.  Procedure history is positive for stress echo.     Past Medical History  Diagnosis Date  . Schizoaffective disorder   . Urinary tract infection   . Hypertension   . Atrial fibrillation   . GERD (gastroesophageal reflux disease)   . Hypothyroidism   . Myocardial infarction   . Diabetes mellitus   . Obesity   . Asthma   . COPD (chronic obstructive pulmonary disease)   . Pneumonia   . CHF (congestive heart failure)     Past Surgical History  Procedure Date  . Tubal ligation   . Abdominal hysterectomy     History reviewed. No pertinent family history.  History  Substance Use Topics  . Smoking status: Former Smoker -- 0.0 packs/day    Quit date: 06/29/2000  . Smokeless tobacco: Current User    Types: Snuff, Chew  . Alcohol Use: No    OB History    Grav Para Term Preterm Abortions TAB SAB Ect Mult  Living                  Review of Systems  Constitutional: Positive for fever. Negative for chills and diaphoresis.  Respiratory: Positive for shortness of breath. Negative for cough.   Cardiovascular: Positive for chest pain. Negative for palpitations and near-syncope.  Gastrointestinal: Positive for nausea, vomiting and abdominal pain.  Musculoskeletal: Negative for back pain.  Skin: Negative for color change and rash.  Neurological: Negative for light-headedness, numbness and headaches.  All other systems reviewed and are negative.    Allergies  Sulfamethoxazole w-trimethoprim and Penicillins  Home Medications   Current Outpatient Rx  Name Route Sig Dispense Refill  . ACETAMINOPHEN 325 MG PO TABS Oral Take 650 mg by mouth every 6 (six) hours as needed. For pain    . ALBUTEROL SULFATE HFA 108 (90 BASE) MCG/ACT IN AERS Inhalation Inhale 1 puff into the lungs every 6 (six) hours as needed. For wheezing    . ASPIRIN EC 81 MG PO TBEC Oral Take 81 mg by mouth every morning.     Marland Kitchen CHLORPROMAZINE HCL 50 MG PO TABS Oral Take 50-200 mg by mouth 3 (three) times daily. Take 1 tablet (50MG ) twice daily and 4 tablets (200MG ) at bedtime.    Marland Kitchen DIVALPROEX SODIUM ER 500 MG PO TB24 Oral Take 1,000 mg by mouth 2 (two) times daily.    Marland Kitchen FLUTICASONE PROPIONATE 50 MCG/ACT NA SUSP Nasal Place 2 sprays into the nose  daily as needed. For nasal congestion    . FOLIC ACID 1 MG PO TABS Oral Take 1 mg by mouth every morning.     Marland Kitchen HYDROCODONE-ACETAMINOPHEN 5-325 MG PO TABS Oral Take 1 tablet by mouth every 6 (six) hours as needed for pain. 6 tablet 0  . LEVOTHYROXINE SODIUM 150 MCG PO TABS Oral Take 150 mcg by mouth daily.    Marland Kitchen LISINOPRIL 40 MG PO TABS Oral Take 40 mg by mouth every 12 (twelve) hours.     Marland Kitchen METOPROLOL SUCCINATE ER 50 MG PO TB24 Oral Take 50 mg by mouth daily. Take with or immediately following a meal.    . OMEPRAZOLE 20 MG PO CPDR Oral Take 20 mg by mouth daily.     Marland Kitchen PALIPERIDONE ER 6 MG  PO TB24 Oral Take 6 mg by mouth 2 (two) times daily.      BP 99/65  Pulse 105  Temp 97.6 F (36.4 C) (Oral)  Resp 18  SpO2 98%  Physical Exam  Nursing note and vitals reviewed. Constitutional: She is oriented to person, place, and time. She appears well-developed and well-nourished.       Morbidly obese  HENT:  Head: Normocephalic and atraumatic.  Eyes: Pupils are equal, round, and reactive to light.  Neck: No JVD present.  Cardiovascular: Normal rate, regular rhythm, normal heart sounds and intact distal pulses.   Pulmonary/Chest: Effort normal and breath sounds normal. No respiratory distress. She exhibits no tenderness.  Abdominal: Soft. Bowel sounds are normal. She exhibits no distension. There is no tenderness.       Non tender with auscultation despite significant pressure applied; complaining of tenderness with palpation (says "ow" but not grimacing, moving away).  Musculoskeletal: She exhibits edema (trace, bilateral).  Neurological: She is alert and oriented to person, place, and time.  Skin: Skin is warm and dry.  Psychiatric: She has a normal mood and affect.    ED Course  Procedures (including critical care time)  Labs Reviewed  GLUCOSE, CAPILLARY - Abnormal; Notable for the following:    Glucose-Capillary 103 (*)     All other components within normal limits   Dg Chest 2 View  01/04/2012  *RADIOLOGY REPORT*  Clinical Data: Pain and shortness of breath  CHEST - 2 VIEW  Comparison: 01/02/2012  Findings: The heart and pulmonary vascularity are within normal limits.  The lungs are clear bilaterally.  No bony abnormality is seen.  IMPRESSION: No acute intrathoracic abnormality.  Original Report Authenticated By: Phillips Odor, M.D.     Date: 01/04/2012  Rate: 107  Rhythm: sinus tachycardia  QRS Axis: right  Intervals: normal  ST/T Wave abnormalities: normal  Conduction Disutrbances:none  Narrative Interpretation:   Old EKG Reviewed: unchanged   (01/02/12)     1. Chest pain   2. Schizoaffective disorder, bipolar type       MDM  This is a 61 year old female who is being seen here in the ED multiple times over the past couple of weeks who presents today with multiple complaints. She states that she has had chest pain, shortness of breath, abdominal pain, nausea and vomiting, diarrhea, and this has been going on since she was last seen. She says that she was seen at [facility] last night and was told that she had a heart blockage and needed to followup here. She also notes a fever to 103 at home today, and says that she had several episodes of coffee-ground emesis. The patient was admitted  here from the eighth to the 10th of this month and had a nuclear medicine study that did not show any signs of inducible, and was seen here 2 nights ago where a CT angiogram chest not show any signs of PE or other intrathoracic abnormalities. When speaking with the patient's husband, he states that she has not been seen at [facility] and months, but she has not thrown up at all today or had any diarrhea, he also denies her having any fevers. He says that the patient has seems to be increasingly anxious lately, and keeps coming back to the ED, but he does not think that there is anything medically wrong with her based on what he is seeing at home. The patient appears comfortable on exam, he does not have any chest abdominal tenderness. When palpating her abdomen she is complaining of abdominal pain, however when auscultating her abdomen significant pressure is able to be applied to the stethoscope, and does not cause any pain. Her lungs are clear and she has trace lower extremity edema. Given that her complaints are the same as they have in the past multiple times that she has been here, her exam is unchanged, and her husband denies that many of her complaints are seen at home, do not feel that extensive workup is warranted again tonight. Her EKG is  unchanged, and will get a chest x-ray to evaluate given her complaints of shortness of breath.  Chest x-ray is unremarkable. Discussed with the patient importance of followup with her regular cardiologist, which he states is scheduled for this week, as well as followup with her regular doctor her persistent symptoms. Discussed again with her that do not feel there is an emergent cause for her symptoms, and that she will be better served with care from her primary physician. The patient expresses understanding of this plan.    Theotis Burrow, MD 01/05/12 0100

## 2012-01-05 NOTE — ED Provider Notes (Signed)
I have seen and examined this patient with the resident.  I agree with the resident's note, assessment and plan except as indicated.    Patient with some mild dyspnea with a normal chest x-ray.  Patient will be discharged home.  Nat Christen, MD 01/05/12 [phone]

## 2012-01-10 ENCOUNTER — Inpatient Hospital Stay (HOSPITAL_COMMUNITY)
Admission: EM | Admit: 2012-01-10 | Discharge: 2012-01-12 | DRG: 640 | Disposition: A | Discharge status: Home / Self Care | Payer: Medicare Other | Attending: Family Medicine | Admitting: Family Medicine

## 2012-01-10 ENCOUNTER — Emergency Department (HOSPITAL_COMMUNITY): Payer: Medicare Other

## 2012-01-10 ENCOUNTER — Encounter (HOSPITAL_COMMUNITY): Payer: Self-pay | Admitting: Emergency Medicine

## 2012-01-10 DIAGNOSIS — E871 Hypo-osmolality and hyponatremia: Principal | ICD-10-CM

## 2012-01-10 DIAGNOSIS — F70 Mild intellectual disabilities: Secondary | ICD-10-CM

## 2012-01-10 DIAGNOSIS — J45909 Unspecified asthma, uncomplicated: Secondary | ICD-10-CM

## 2012-01-10 DIAGNOSIS — M1710 Unilateral primary osteoarthritis, unspecified knee: Secondary | ICD-10-CM

## 2012-01-10 DIAGNOSIS — K8020 Calculus of gallbladder without cholecystitis without obstruction: Secondary | ICD-10-CM

## 2012-01-10 DIAGNOSIS — E669 Obesity, unspecified: Secondary | ICD-10-CM

## 2012-01-10 DIAGNOSIS — K219 Gastro-esophageal reflux disease without esophagitis: Secondary | ICD-10-CM

## 2012-01-10 DIAGNOSIS — R443 Hallucinations, unspecified: Secondary | ICD-10-CM

## 2012-01-10 DIAGNOSIS — G4733 Obstructive sleep apnea (adult) (pediatric): Secondary | ICD-10-CM

## 2012-01-10 DIAGNOSIS — R45851 Suicidal ideations: Secondary | ICD-10-CM

## 2012-01-10 DIAGNOSIS — J4489 Other specified chronic obstructive pulmonary disease: Secondary | ICD-10-CM | POA: Diagnosis present

## 2012-01-10 DIAGNOSIS — E039 Hypothyroidism, unspecified: Secondary | ICD-10-CM

## 2012-01-10 DIAGNOSIS — R82998 Other abnormal findings in urine: Secondary | ICD-10-CM

## 2012-01-10 DIAGNOSIS — I219 Acute myocardial infarction, unspecified: Secondary | ICD-10-CM

## 2012-01-10 DIAGNOSIS — J189 Pneumonia, unspecified organism: Secondary | ICD-10-CM

## 2012-01-10 DIAGNOSIS — E538 Deficiency of other specified B group vitamins: Secondary | ICD-10-CM

## 2012-01-10 DIAGNOSIS — M545 Low back pain, unspecified: Secondary | ICD-10-CM

## 2012-01-10 DIAGNOSIS — J449 Chronic obstructive pulmonary disease, unspecified: Secondary | ICD-10-CM | POA: Diagnosis present

## 2012-01-10 DIAGNOSIS — M25519 Pain in unspecified shoulder: Secondary | ICD-10-CM | POA: Diagnosis present

## 2012-01-10 DIAGNOSIS — D649 Anemia, unspecified: Secondary | ICD-10-CM

## 2012-01-10 DIAGNOSIS — G244 Idiopathic orofacial dystonia: Secondary | ICD-10-CM

## 2012-01-10 DIAGNOSIS — G4700 Insomnia, unspecified: Secondary | ICD-10-CM

## 2012-01-10 DIAGNOSIS — IMO0002 Reserved for concepts with insufficient information to code with codable children: Secondary | ICD-10-CM

## 2012-01-10 DIAGNOSIS — R079 Chest pain, unspecified: Secondary | ICD-10-CM

## 2012-01-10 DIAGNOSIS — Z8679 Personal history of other diseases of the circulatory system: Secondary | ICD-10-CM

## 2012-01-10 DIAGNOSIS — R0609 Other forms of dyspnea: Secondary | ICD-10-CM

## 2012-01-10 DIAGNOSIS — Z6841 Body Mass Index (BMI) 40.0 and over, adult: Secondary | ICD-10-CM

## 2012-01-10 DIAGNOSIS — F250 Schizoaffective disorder, bipolar type: Secondary | ICD-10-CM

## 2012-01-10 DIAGNOSIS — F259 Schizoaffective disorder, unspecified: Secondary | ICD-10-CM | POA: Diagnosis present

## 2012-01-10 DIAGNOSIS — I10 Essential (primary) hypertension: Secondary | ICD-10-CM

## 2012-01-10 LAB — CBC WITH DIFFERENTIAL/PLATELET
HCT: 37.2 % (ref 36.0–46.0)
Hemoglobin: 12.3 g/dL (ref 12.0–15.0)
Lymphocytes Relative: 35 % (ref 12–46)
Monocytes Absolute: 0.7 10*3/uL (ref 0.1–1.0)
Monocytes Relative: 14 % — ABNORMAL HIGH (ref 3–12)
Neutro Abs: 2.3 10*3/uL (ref 1.7–7.7)
WBC: 4.6 10*3/uL (ref 4.0–10.5)

## 2012-01-10 LAB — BASIC METABOLIC PANEL
Calcium: 9.6 mg/dL (ref 8.4–10.5)
Creatinine, Ser: 0.95 mg/dL (ref 0.50–1.10)
GFR calc Af Amer: 75 mL/min — ABNORMAL LOW (ref >90)

## 2012-01-10 LAB — VALPROIC ACID LEVEL: Valproic Acid Lvl: 95.9 ug/mL (ref 50.0–100.0)

## 2012-01-10 MED ORDER — ALBUTEROL SULFATE (5 MG/ML) 0.5% IN NEBU: 5.0000 mg | INHALATION_SOLUTION | Freq: Once | RESPIRATORY_TRACT | Status: DC

## 2012-01-10 MED ORDER — CEFTRIAXONE SODIUM 1 G IJ SOLR
1.0000 g | Freq: Once | INTRAMUSCULAR | Status: AC
  Administered 2012-01-10: 1 g via INTRAVENOUS
  Filled 2012-01-10: qty 10

## 2012-01-10 MED ORDER — ALBUTEROL SULFATE (5 MG/ML) 0.5% IN NEBU
INHALATION_SOLUTION | RESPIRATORY_TRACT | Status: AC
  Filled 2012-01-10: qty 1

## 2012-01-10 MED ORDER — DEXTROSE 5 % IV SOLN
500.0000 mg | Freq: Once | INTRAVENOUS | Status: AC
  Administered 2012-01-11: 01:00:00 via INTRAVENOUS
  Filled 2012-01-10: qty 500

## 2012-01-10 MED ORDER — SODIUM CHLORIDE 0.9 % IV SOLN
Freq: Once | INTRAVENOUS | Status: AC
  Administered 2012-01-10: 150 mL/h via INTRAVENOUS

## 2012-01-10 NOTE — ED Notes (Signed)
[id number]<AV> Expected date:<BR> Expected time:<BR> Means of arrival:<BR> Comments:<BR> EMS/wheezing/psych issues
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
## 2012-01-11 ENCOUNTER — Encounter (HOSPITAL_COMMUNITY): Payer: Self-pay | Admitting: Internal Medicine

## 2012-01-11 ENCOUNTER — Inpatient Hospital Stay (HOSPITAL_COMMUNITY): Payer: Medicare Other

## 2012-01-11 DIAGNOSIS — E871 Hypo-osmolality and hyponatremia: Secondary | ICD-10-CM

## 2012-01-11 DIAGNOSIS — I219 Acute myocardial infarction, unspecified: Secondary | ICD-10-CM

## 2012-01-11 DIAGNOSIS — R079 Chest pain, unspecified: Secondary | ICD-10-CM

## 2012-01-11 DIAGNOSIS — J189 Pneumonia, unspecified organism: Secondary | ICD-10-CM

## 2012-01-11 DIAGNOSIS — K8020 Calculus of gallbladder without cholecystitis without obstruction: Secondary | ICD-10-CM

## 2012-01-11 LAB — LEGIONELLA ANTIGEN, URINE: Legionella Antigen, Urine: NEGATIVE

## 2012-01-11 LAB — CARDIAC PANEL(CRET KIN+CKTOT+MB+TROPI)
Relative Index: INVALID (ref 0.0–2.5)
Relative Index: INVALID (ref 0.0–2.5)
Total CK: 21 U/L (ref 7–177)
Total CK: 29 U/L (ref 7–177)
Total CK: 37 U/L (ref 7–177)
Troponin I: <0.3 ng/mL (ref <0.30)
Troponin I: <0.3 ng/mL (ref <0.30)

## 2012-01-11 LAB — STREP PNEUMONIAE URINARY ANTIGEN: Strep Pneumo Urinary Antigen: NEGATIVE

## 2012-01-11 LAB — RAPID URINE DRUG SCREEN, HOSP PERFORMED
Amphetamines: NOT DETECTED
Barbiturates: NOT DETECTED
Benzodiazepines: NOT DETECTED
Cocaine: NOT DETECTED
Tetrahydrocannabinol: NOT DETECTED

## 2012-01-11 LAB — URINALYSIS, ROUTINE W REFLEX MICROSCOPIC
Glucose, UA: NEGATIVE mg/dL
Hgb urine dipstick: NEGATIVE
Ketones, ur: NEGATIVE mg/dL
Leukocytes, UA: NEGATIVE
pH: 7 (ref 5.0–8.0)

## 2012-01-11 LAB — OSMOLALITY, URINE: Osmolality, Ur: 241 mOsm/kg — ABNORMAL LOW (ref 390–1090)

## 2012-01-11 LAB — GLUCOSE, CAPILLARY
Glucose-Capillary: 87 mg/dL (ref 70–99)
Glucose-Capillary: 157 mg/dL — ABNORMAL HIGH (ref 70–99)

## 2012-01-11 LAB — HEMOGLOBIN A1C: Mean Plasma Glucose: 108 mg/dL (ref <117)

## 2012-01-11 LAB — TSH: TSH: 0.826 u[IU]/mL (ref 0.350–4.500)

## 2012-01-11 MED ORDER — ASPIRIN EC 81 MG PO TBEC
81.0000 mg | DELAYED_RELEASE_TABLET | Freq: Every day | ORAL | Status: DC
  Administered 2012-01-11 – 2012-01-12 (×2): 81 mg via ORAL
  Filled 2012-01-11 (×2): qty 1

## 2012-01-11 MED ORDER — METOPROLOL SUCCINATE ER 50 MG PO TB24
50.0000 mg | ORAL_TABLET | Freq: Every day | ORAL | Status: DC
  Administered 2012-01-11 – 2012-01-12 (×2): 50 mg via ORAL
  Filled 2012-01-11 (×2): qty 1

## 2012-01-11 MED ORDER — VANCOMYCIN HCL 1000 MG IV SOLR
1500.0000 mg | Freq: Two times a day (BID) | INTRAVENOUS | Status: DC
  Administered 2012-01-11 – 2012-01-12 (×2): 1500 mg via INTRAVENOUS
  Filled 2012-01-11 (×3): qty 1500

## 2012-01-11 MED ORDER — CHLORPROMAZINE HCL 25 MG PO TABS: 50.0000 mg | ORAL_TABLET | Freq: Three times a day (TID) | ORAL | Status: DC

## 2012-01-11 MED ORDER — CHLORPROMAZINE HCL 100 MG PO TABS
200.0000 mg | ORAL_TABLET | Freq: Every day | ORAL | Status: DC
  Administered 2012-01-11 (×2): 200 mg via ORAL
  Filled 2012-01-11 (×4): qty 2

## 2012-01-11 MED ORDER — ALBUTEROL SULFATE HFA 108 (90 BASE) MCG/ACT IN AERS: 1.0000 | INHALATION_SPRAY | Freq: Four times a day (QID) | RESPIRATORY_TRACT | Status: DC | PRN

## 2012-01-11 MED ORDER — ACETAMINOPHEN 325 MG PO TABS
650.0000 mg | ORAL_TABLET | Freq: Four times a day (QID) | ORAL | Status: DC | PRN
  Administered 2012-01-11: 650 mg via ORAL
  Filled 2012-01-11: qty 2

## 2012-01-11 MED ORDER — LISINOPRIL 40 MG PO TABS
40.0000 mg | ORAL_TABLET | Freq: Two times a day (BID) | ORAL | Status: DC
  Administered 2012-01-11 (×3): 40 mg via ORAL
  Filled 2012-01-11 (×5): qty 1

## 2012-01-11 MED ORDER — SODIUM CHLORIDE 0.9 % IV SOLN: INTRAVENOUS | Status: AC

## 2012-01-11 MED ORDER — INSULIN ASPART 100 UNIT/ML ~~LOC~~ SOLN: 0.0000 [IU] | Freq: Three times a day (TID) | SUBCUTANEOUS | Status: DC

## 2012-01-11 MED ORDER — LEVOFLOXACIN IN D5W 750 MG/150ML IV SOLN
750.0000 mg | INTRAVENOUS | Status: DC
  Administered 2012-01-12: 750 mg via INTRAVENOUS
  Filled 2012-01-11 (×2): qty 150

## 2012-01-11 MED ORDER — FLUTICASONE PROPIONATE 50 MCG/ACT NA SUSP
2.0000 | Freq: Every day | NASAL | Status: DC
  Administered 2012-01-11: 2 via NASAL
  Filled 2012-01-11 (×2): qty 16

## 2012-01-11 MED ORDER — CHLORPROMAZINE HCL 50 MG PO TABS
50.0000 mg | ORAL_TABLET | Freq: Two times a day (BID) | ORAL | Status: DC
  Administered 2012-01-11 – 2012-01-12 (×3): 50 mg via ORAL
  Filled 2012-01-11 (×5): qty 1

## 2012-01-11 MED ORDER — VANCOMYCIN HCL 1000 MG IV SOLR
2000.0000 mg | Freq: Once | INTRAVENOUS | Status: AC
  Administered 2012-01-11: 2000 mg via INTRAVENOUS
  Filled 2012-01-11: qty 2000

## 2012-01-11 MED ORDER — INSULIN ASPART 100 UNIT/ML ~~LOC~~ SOLN: 0.0000 [IU] | Freq: Every day | SUBCUTANEOUS | Status: DC

## 2012-01-11 MED ORDER — DIVALPROEX SODIUM ER 500 MG PO TB24
1000.0000 mg | ORAL_TABLET | Freq: Two times a day (BID) | ORAL | Status: DC
  Administered 2012-01-11 – 2012-01-12 (×4): 1000 mg via ORAL
  Filled 2012-01-11 (×5): qty 2

## 2012-01-11 MED ORDER — FOLIC ACID 1 MG PO TABS
1.0000 mg | ORAL_TABLET | ORAL | Status: DC
  Administered 2012-01-11 – 2012-01-12 (×2): 1 mg via ORAL
  Filled 2012-01-11 (×2): qty 1

## 2012-01-11 MED ORDER — PALIPERIDONE ER 6 MG PO TB24
6.0000 mg | ORAL_TABLET | Freq: Two times a day (BID) | ORAL | Status: DC
  Administered 2012-01-11 – 2012-01-12 (×4): 6 mg via ORAL
  Filled 2012-01-11 (×5): qty 1

## 2012-01-11 MED ORDER — LEVOTHYROXINE SODIUM 150 MCG PO TABS
150.0000 ug | ORAL_TABLET | Freq: Every day | ORAL | Status: DC
  Administered 2012-01-11 – 2012-01-12 (×2): 150 ug via ORAL
  Filled 2012-01-11 (×3): qty 1

## 2012-01-11 MED ORDER — PANTOPRAZOLE SODIUM 40 MG PO TBEC
40.0000 mg | DELAYED_RELEASE_TABLET | Freq: Every day | ORAL | Status: DC
  Administered 2012-01-11 – 2012-01-12 (×2): 40 mg via ORAL
  Filled 2012-01-11 (×2): qty 1

## 2012-01-11 MED ORDER — DEXTROSE 5 % IV SOLN
2.0000 g | Freq: Three times a day (TID) | INTRAVENOUS | Status: DC
  Administered 2012-01-11 – 2012-01-12 (×4): 2 g via INTRAVENOUS
  Filled 2012-01-11 (×5): qty 2

## 2012-01-11 NOTE — Progress Notes (Signed)
Patient complaining that her left arm hurts.  Patient able to wiggle her left fingers, positive sensation, limited movement.  Skin color pink and warm.  Patient requesting an xray of her left arm. Page sent to Dr. Mahala Menghini.  Will continue to monitor.

## 2012-01-11 NOTE — ED Notes (Signed)
AC called. Sarah RN will assist in transferring pt.

## 2012-01-11 NOTE — Progress Notes (Signed)
ANTIBIOTIC CONSULT NOTE - INITIAL  Pharmacy Consult for Vancomycin Indication: PNA  Allergies  Allergen Reactions  . Sulfamethoxazole W-Trimethoprim Hives and Nausea And Vomiting  . Penicillins Hives    Patient Measurements: Weight: 329 lb (149.233 kg)   Vital Signs: Temp: 98.3 F (36.8 C) (07/24 0106) Temp src: Oral (07/24 0106) BP: 151/97 mmHg (07/24 0100) Pulse Rate: 103  (07/24 0100) Intake/Output from previous day:   Intake/Output from this shift:    Labs:  [facility] 01/10/12 2142  WBC 4.6  HGB 12.3  PLT 143*  LABCREA --  CREATININE 0.95   The CrCl is unknown because both a height and weight (above a minimum accepted value) are required for this calculation. No results found for this basename: VANCOTROUGH:2,VANCOPEAK:2,VANCORANDOM:2,GENTTROUGH:2,GENTPEAK:2,GENTRANDOM:2,TOBRATROUGH:2,TOBRAPEAK:2,TOBRARND:2,AMIKACINPEAK:2,AMIKACINTROU:2,AMIKACIN:2, in the last 72 hours   Microbiology: No results found for this or any previous visit (from the past 720 hour(s)).  Medical History: Past Medical History  Diagnosis Date  . Schizoaffective disorder   . Urinary tract infection   . Hypertension   . Atrial fibrillation   . GERD (gastroesophageal reflux disease)   . Hypothyroidism   . Myocardial infarction   . Diabetes mellitus   . Obesity   . Asthma   . COPD (chronic obstructive pulmonary disease)   . Pneumonia   . CHF (congestive heart failure)     Medications:  Scheduled:    . sodium chloride   Intravenous Once  . albuterol  5 mg Nebulization Once  . albuterol      . aspirin EC  81 mg Oral Daily  . azithromycin  500 mg Intravenous Once  . aztreonam  2 g Intravenous Q8H  . cefTRIAXone (ROCEPHIN)  IV  1 g Intravenous Once  . chlorproMAZINE  50-200 mg Oral TID  . divalproex  1,000 mg Oral BID  . fluticasone  2 spray Each Nare Daily  . folic acid  1 mg Oral BH-q7a  . insulin aspart  0-5 Units Subcutaneous QHS  . insulin aspart  0-9 Units Subcutaneous  TID WC  . levofloxacin (LEVAQUIN) IV  750 mg Intravenous Q24H  . levothyroxine  150 mcg Oral Daily  . lisinopril  40 mg Oral Q12H  . metoprolol succinate  50 mg Oral Daily  . paliperidone  6 mg Oral BID  . pantoprazole  40 mg Oral Q1200  . vancomycin  1,500 mg Intravenous Q12H  . vancomycin  2,000 mg Intravenous Once   Infusions:    . sodium chloride     Assessment: 61 yo female with schizoaffective disorder c/o of CP, CXR shows PNA.  (Levaquin/Azactam/Vancomycin)  Goal of Therapy:  Vancomycin trough level 15-20 mcg/ml  Plan:   Vancomycin 2Gm x1 then 1500mg  IV q12h.  CrCl~73 (N).  F/U SCr/levels as needed.  Susanne Greenhouse R 01/11/2012,1:35 AM
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
## 2012-01-11 NOTE — ED Notes (Signed)
Report called to receiving RN in 1518.

## 2012-01-11 NOTE — ED Notes (Signed)
MD at bedside. 

## 2012-01-11 NOTE — ED Provider Notes (Signed)
Medical screening examination/treatment/procedure(s) were conducted as a shared visit with non-physician practitioner(s) and myself.  I personally evaluated the patient during the encounter  Toy Baker, MD 01/11/12 [phone]

## 2012-01-11 NOTE — Progress Notes (Signed)
Attempted to locate Dr. Hortencia Pilar.  Dr. Hortencia Pilar did not stay with Mental Health when it was taken over by [facility].  No one at [facility] knows where Dr. Hortencia Pilar is currently practicing.  Google search was not helpful.   CSW to discuss with Pt how to contact Dr. Hortencia Pilar.  Providence Crosby, LCSWA Clinical Social Work [phone]

## 2012-01-11 NOTE — Progress Notes (Addendum)
6:54 PM I agree with HPI/GPe and A/P per Dr. Pearson Grippe   Patient hears internal voices of her husband telling her "to join him" she says today she still feels "lousy" She denies any chest pain nausea vomiting or other issues currently She denies any suicidal ideation      HEENT-morbidly obese pleasant Caucasian female CHEST-clinically clear no added sound CARDIAC-S1-S2 no murmur rub or gallop ABDOMEN-morbidly obese, no rebound or guarding NEURO- motor strength within normal limits however left arm appears swollen. SKIN/MUSCULAR-intact.  Grade 1-2 lower extremity edema  Patient Active Problem List  Diagnosis  . HYPOTHYROIDISM NOS  . DEFICIENCY, B-COMPLEX NEC  . OBESITY  . RETARDATION, MENTAL, MILD  . OBSTRUCTIVE SLEEP APNEA  . TARDIVE DYSKINESIA  . HYPERTENSION, BENIGN ESSENTIAL  . MYOCARDIAL INFARCTION  . ASTHMA  . GERD  . CHOLELITHIASIS  . DEGENERATIVE JOINT DISEASE, KNEES, BILATERAL  . LOW BACK PAIN SYNDROME  . INSOMNIA  . DYSPNEA ON EXERTION  . URINALYSIS, ABNORMAL  . CHEST PAIN, HX OF  . ANEMIA  . CHEST PAIN  . Schizoaffective disorder, bipolar type  . Community acquired pneumonia  . Suicidal ideations  . Chest pain  . Hyponatremia  . Healthcare-associated pneumonia   1-hyponatremia could be potentially secondary to medication effect-her urine osmolality is 286, which is borderline low.  It is likely that this could be also an SIADH picture vs Psychogenic polydipsia, as her free cortisol is 18.5 (normal), TSH is 0.826.  Of no utility currently to order these labs as patient is currently on normal saline.   2-patient's Depakote level was drawn 7/23 and was 95.9-this is at the higher recommended therapeutic range and this potentially could be cut back slightly-will discuss with psychiatrist in the morning 3-pneumonia-given patient was just discharged from the hospital 7/8 for atrial fibrillation/flutter and atypical chest pain I would treat her as a healthcare  associated pneumonia, non-ICU patient. She should be transitioned tomorrow to oral antibiotics if her white count is normal-I will repeat a chest x-ray tomorrow to insure that there is actually a pneumonia before we continued antibiotic 4-?  CAD with negative left cardiac catheterization 2007, negative nuclear stress test x2-per cardiologist 5-COPD-continue meds 6-history of atrial fibrillation in the past-unclear if Coumadin candidate.  [place] score 1-2 7-schizoaffective disorder-Dr. Baron Sane has seen the patient.  We will follow recommendations. 8-shoulder pain-patient states that she is fallen at her last hospital visit.  Left upper arm is slightly more swollen than right however when patient is distracted, she has full range of motion.  We will rule this out with dedicated shoulder x-ray in the morning 9-Super morbid Obesity-Needs intensive outpatient therapy.  Given pyschiatric issues, unclear if a candidate for disease  Full Code Spoke with [employer]. [phone]-informed him of progress and likely discharge home soon  Pleas Koch, MD [facility] [phone]

## 2012-01-11 NOTE — Consult Note (Signed)
Patient Identification:  [name] Date of Evaluation:  01/11/2012 Reason for Consult: Schizoaffective Disorder, Recommend Medications  Referring Provider: Dr. Mahala [name]   History of Present Illness:Pt is sitting up in bed, awake and alert.  She says her fiance brought her here because he thought she was not taking her medications.  She says she wasn't always taking her medications.  She says she hears footsteps at night and believes it is her deceased husband trying to tell her something.  She does not mention her reason for admission which is her pneumonia.  She also had hyponatremia.    Past Psychiatric History:She says Dr. Hortencia [name] comes to her home to provide psychotropic medication prescriptions for her.  She has been to [facility] for treatment of her psychosis and depression   Past Medical History:     Past Medical History  Diagnosis Date  . Schizoaffective disorder   . Urinary tract infection   . Hypertension   . Atrial fibrillation   . GERD (gastroesophageal reflux disease)   . Hypothyroidism   . Myocardial infarction   . Diabetes mellitus   . Obesity   . Asthma   . COPD (chronic obstructive pulmonary disease)   . Pneumonia   . CHF (congestive heart failure)        Past Surgical History  Procedure Date  . Tubal ligation   . Abdominal hysterectomy     Allergies:  Allergies  Allergen Reactions  . Sulfamethoxazole W-Trimethoprim Hives and Nausea And Vomiting  . Penicillins Hives    Current Medications:  Prior to Admission medications   Medication Sig Start Date End Date Taking? Authorizing Provider  acetaminophen (TYLENOL) 325 MG tablet Take 650 mg by mouth every 6 (six) hours as needed. For pain   Yes Historical Provider, MD  albuterol (PROVENTIL HFA;VENTOLIN HFA) 108 (90 BASE) MCG/ACT inhaler Inhale 1 puff into the lungs every 6 (six) hours as needed. For wheezing   Yes Historical Provider, MD  aspirin EC 81 MG tablet Take 81 mg by mouth every morning.    Yes  Historical Provider, MD  chlorproMAZINE (THORAZINE) 50 MG tablet Take 50-200 mg by mouth 3 (three) times daily. Take 1 tablet (50MG ) twice daily and 4 tablets (200MG ) at bedtime.   Yes Historical Provider, MD  divalproex (DEPAKOTE ER) 500 MG 24 hr tablet Take 1,000 mg by mouth 2 (two) times daily.   Yes Historical Provider, MD  fluticasone (FLONASE) 50 MCG/ACT nasal spray Place 2 sprays into the nose daily as needed. For nasal congestion   Yes Historical Provider, MD  folic acid (FOLVITE) 1 MG tablet Take 1 mg by mouth every morning.    Yes Historical Provider, MD  HYDROcodone-acetaminophen (NORCO) 5-325 MG per tablet Take 1 tablet by mouth every 6 (six) hours as needed for pain. 01/02/12 01/12/12 Yes Jennifer Mahoney-Tesoriero, MD  levothyroxine (SYNTHROID, LEVOTHROID) 150 MCG tablet Take 150 mcg by mouth daily.   Yes Historical Provider, MD  lisinopril (PRINIVIL,ZESTRIL) 40 MG tablet Take 40 mg by mouth every 12 (twelve) hours.    Yes Historical Provider, MD  metoprolol succinate (TOPROL-XL) 50 MG 24 hr tablet Take 50 mg by mouth daily. Take with or immediately following a meal.   Yes Historical Provider, MD  omeprazole (PRILOSEC) 20 MG capsule Take 20 mg by mouth daily.    Yes Historical Provider, MD  paliperidone ([name]) 6 MG 24 hr tablet Take 6 mg by mouth 2 (two) times daily.   Yes Historical Provider, MD  Social History:    reports that she quit smoking about 11 years ago. Her smokeless tobacco use includes Snuff and Chew. She reports that she does not drink alcohol or use illicit drugs.   Family History:    Family History  Problem Relation Age of Onset  . Heart disease Father   . Cancer Mother 74    Breast    Mental Status Examination/Evaluation: Objective:  Appearance: Casual and morbidly obese  Psychomotor Activity:  Normal  Eye Contact::  Good  Speech:  some stuttering but understandable; no dentures [at home]  Volume:  Normal  Mood:  Anxious and Dysphoric  Affect:  Blunt    Thought Process:  Disorganized  Orientation:  Other:  she is oriented to person place and partially to situation  Thought Content:  Auditory hallucinations and Delusions  Suicidal Thoughts:  No  Homicidal Thoughts:  No  Judgement:  Poor  Insight:  Fair    DIAGNOSIS:   AXIS I   Schizoaffective Disorder, bipolar type  AXIS II  Deffered  AXIS III See medical notes.  AXIS IV economic problems, other psychosocial or environmental problems and problems with access to health care services  AXIS V 61-70 mild symptoms   Assessment/Plan:  Discussed with Dr. Mahala [name], Evaluated with Psych CSW Pt is awake, alert, engages in conversation readily.  She tries to explain her marital status and she says she has been with her fiance for ~ 20 years.  Then says her first husband died.  She had four children removed by CPS.  She married a second time ~ 12/03/04, no, 12/03/96.  This husband died in 12/03/2004 and has been with her fiance for a a length of time that overlaps the time she was married ???This explanation was very confusing.  She says she has not been taking medication as prescribed.  Review of her medications finds a very high dose of FGA Thorazine, Chlorpromazine, 300 mg [200 mg at night and 50 mg 2 X daily], [name] 6 mg 2 X daily for psychosis and Depakote 1000 mg 2 X daily for mood regulation.    These are probably necessary for her size, a large volume of distribution to produce expected effect.  It is difficult to tell what effect this regimen has unless blood levels are drawn.  Given the time and opportunity, it is possible that other SGA medications may be considered.  It is not expedient to try to titrate medications that take several weeks as an inpatient.   For the present time, these dose is expected to be effective if she is taking it daily and if the thorazine has minimal effect on blood pressure or tardive dyskinesia.  Today there are no signs of EPS or TD. She denies SI/HI.  She admits she has had this  VH of her husband and hearing of footsteps for some time.  In review of her history including MMR - which is not documented at this time, the outpatient service she receives with [facility] is the option most helpful to her.  She needs to have continued contact with a psychiatrist and supportive therapy.  It is noted that Dr. Hortencia [name] cannot be located.  It may be that pt has a contact number or it will be important to help her meet another psychiatrist.  If there are any medication changes, they need to be scheduled slowly with frequent contact to assess adjustment to a new medication. RECOMMENDATION:  1.  Pt denies suicidal thought and  has sufficient capacity to return home for home care. 2.  Suggest addition of trazodone 150 mg at bedtime for sleep. 3.  Agrees with listed medications: thorazine, depakote and Inderal if VS and chemistries remain stable. 4.  Suggest depakote level before am dose 5.  Suggest thorazine level 6.  Pt has delusions or AH, difficult to discern, but no need for referral to inpatient psychiatric facility 7.  No further psychiatric needs unless requested Armen Waring J. Ferol Luz, MD Psychiatrist  01/11/2012  3:07 PM  5.

## 2012-01-12 ENCOUNTER — Encounter (HOSPITAL_COMMUNITY): Payer: Self-pay | Admitting: *Deleted

## 2012-01-12 ENCOUNTER — Inpatient Hospital Stay (HOSPITAL_COMMUNITY): Payer: Medicare Other

## 2012-01-12 DIAGNOSIS — E871 Hypo-osmolality and hyponatremia: Secondary | ICD-10-CM

## 2012-01-12 DIAGNOSIS — R079 Chest pain, unspecified: Secondary | ICD-10-CM

## 2012-01-12 LAB — CBC
MCHC: 33 g/dL (ref 30.0–36.0)
Platelets: 129 10*3/uL — ABNORMAL LOW (ref 150–400)
RDW: 14 % (ref 11.5–15.5)
WBC: 4.2 10*3/uL (ref 4.0–10.5)

## 2012-01-12 LAB — GLUCOSE, CAPILLARY
Glucose-Capillary: 91 mg/dL (ref 70–99)
Glucose-Capillary: 99 mg/dL (ref 70–99)

## 2012-01-12 LAB — BASIC METABOLIC PANEL
BUN: 9 mg/dL (ref 6–23)
Creatinine, Ser: 0.9 mg/dL (ref 0.50–1.10)
GFR calc Af Amer: 80 mL/min — ABNORMAL LOW (ref >90)
GFR calc non Af Amer: 69 mL/min — ABNORMAL LOW (ref >90)

## 2012-01-12 MED ORDER — DIVALPROEX SODIUM ER 500 MG PO TB24: 1000.0000 mg | ORAL_TABLET | Freq: Two times a day (BID) | ORAL | Status: DC

## 2012-01-12 MED ORDER — PALIPERIDONE ER 6 MG PO TB24: 6.0000 mg | ORAL_TABLET | Freq: Two times a day (BID) | ORAL | Status: DC

## 2012-01-12 MED ORDER — CHLORPROMAZINE HCL 50 MG PO TABS: 50.0000 mg | ORAL_TABLET | Freq: Three times a day (TID) | ORAL | Status: DC

## 2012-01-12 MED ORDER — LISINOPRIL 20 MG PO TABS
20.0000 mg | ORAL_TABLET | Freq: Every day | ORAL | Status: DC
  Administered 2012-01-12: 20 mg via ORAL
  Filled 2012-01-12: qty 1

## 2012-01-12 MED ORDER — DOXYCYCLINE HYCLATE 100 MG PO TABS
100.0000 mg | ORAL_TABLET | Freq: Two times a day (BID) | ORAL | Status: DC
  Administered 2012-01-12: 100 mg via ORAL
  Filled 2012-01-12 (×2): qty 1

## 2012-01-12 MED ORDER — HYDROCODONE-ACETAMINOPHEN 5-325 MG PO TABS: 1.0000 | ORAL_TABLET | Freq: Four times a day (QID) | ORAL | Status: AC | PRN

## 2012-01-12 NOTE — Progress Notes (Signed)
After speaking with NP. Pt agreed to stay until MD evaluates pt in the am.

## 2012-01-12 NOTE — Discharge Summary (Addendum)
Physician Discharge Summary  [name] ZOX:[id number] DOB: Oct 15, 1950 DOA: 01/10/2012  PCP: Julieanne Manson, MD  Admit date: 01/10/2012 Discharge date: 01/12/2012  Recommendations for Outpatient Follow-up:  1. Rpt CBC in 3 days at [facility], Rpt HbA1c in 3 mo  Discharge Diagnoses:  Principal Problem:  *Hyponatremia Active Problems:  Chest pain  Healthcare-associated pneumonia   1. Hyponatremia  Discharge Condition: Fair   Diet recommendation: Regular diet with increased amounts of salt  History of present illness: 61 yo female with schizoaffective disorder presents to ER due to paranoia. Pt thinks that her dead husband is after her. Pt states that she has had some chest pain right sided that started last nite around 5pm.  Pt has reproducible cp and recent negative nuclear stress test. CXR =>pneumonia.  Patient noted to be hyponatremic.  The patient worked up for both of these issues., Psychiatrist was consulted- and suggested adding Trazadone 150 qhs.    See below for further progress  Hospital Course:  1-hyponatremia could be potentially secondary to medication effect-her urine osmolality is 286, which is borderline low. It is likely that this could be also an SIADH picture vs Psychogenic polydipsia, as her free cortisol is 18.5 (normal), TSH is 0.826. Patient's Depakote level was drawn 7/23 and was 95.9-this is at the higher recommended therapeutic range-  3-pneumonia-given patient was just discharged from the hospital 7/8 for atrial fibrillation/flutter and atypical chest pain.  I would treat her as a healthcare associated pneumonia, non-ICU patient, but a repeat CXr was done 7/25 showing no PNA-as such, I would discontinue all Abx and discharge her home  She should be transitioned tomorrow to oral antibiotics if her white count is normal-I will repeat a chest x-ray tomorrow to insure that there is actually a pneumonia before we continued antibiotic   4-? CAD with  negative left cardiac catheterization 2007, negative nuclear stress test x2-per cardiologist   5-COPD-continue meds   6-history of atrial fibrillation in the past-unclear if Coumadin candidate. [place] score 1-2   7-schizoaffective disorder-Dr. Baron Sane saw patient and didn't recommend specific changes other than addition of Trazadone 150 QHS.  Continue other meds.  Depakote level was 95.6.  Thorazine level not available-will continue chronic meds and reassess  8-shoulder pain-patient states that she is fallen at her last hospital visit. Left upper arm is slightly more swollen than right however when patient is distracted, she has full range of motion. We will rule this out with dedicated shoulder x-ray in the morning  9-? DM-A1c was 5.4.  Needs re-check in 3 mo at [facility]  10-Super Morbid Obesity-Needs intensive outpatient monitoring and potentially a candidate, given Multiple co-morbidities for Gastric bypass surgery   Procedures:  Chest Xray-01/10/12-Cardiomegally, Torturous Aorta,Thoracic spndylosis  Shoulder Xray no acute bony anomaly-degen changes  CXR 7/25= No acute anomalies, Enlarged cardia  Consultations:  Psychiatry-Dr. Ferol Luz  Discharge Exam: Filed Vitals:   01/12/12 1148  BP: 106/56  Pulse: 76  Temp: 98.1 F (36.7 C)  Resp:    Filed Vitals:   01/12/12 0100 01/12/12 0500 01/12/12 0826 01/12/12 1148  BP:  100/65 105/65 106/56  Pulse:  71 82 76  Temp:  97.6 F (36.4 C) 97.8 F (36.6 C) 98.1 F (36.7 C)  TempSrc:  Oral Oral   Resp:  18 20   Height:      Weight: 150.9 kg (332 lb 10.8 oz)     SpO2:  91% 94% 95%   HEENT-morbidly obese pleasant Caucasian female  CHEST-clinically clear no added  sound  CARDIAC-S1-S2 no murmur rub or gallop  ABDOMEN-morbidly obese, no rebound or guarding  NEURO- motor strength within normal limits however left arm appears swollen.  SKIN/MUSCULAR-intact. Grade 1-2 lower extremity edema  Discharge Instructions   Medication  List  As of 01/12/2012  3:20 PM   ASK your doctor about these medications         acetaminophen 325 MG tablet   Commonly known as: TYLENOL   Take 650 mg by mouth every 6 (six) hours as needed. For pain      albuterol 108 (90 BASE) MCG/ACT inhaler   Commonly known as: PROVENTIL HFA;VENTOLIN HFA   Inhale 1 puff into the lungs every 6 (six) hours as needed. For wheezing      aspirin EC 81 MG tablet   Take 81 mg by mouth every morning.      chlorproMAZINE 50 MG tablet   Commonly known as: THORAZINE   Take 50-200 mg by mouth 3 (three) times daily. Take 1 tablet (50MG ) twice daily and 4 tablets (200MG ) at bedtime.      divalproex 500 MG 24 hr tablet   Commonly known as: DEPAKOTE ER   Take 1,000 mg by mouth 2 (two) times daily.      fluticasone 50 MCG/ACT nasal spray   Commonly known as: FLONASE   Place 2 sprays into the nose daily as needed. For nasal congestion      folic acid 1 MG tablet   Commonly known as: FOLVITE   Take 1 mg by mouth every morning.      HYDROcodone-acetaminophen 5-325 MG per tablet   Commonly known as: NORCO/VICODIN   Take 1 tablet by mouth every 6 (six) hours as needed for pain.      levothyroxine 150 MCG tablet   Commonly known as: SYNTHROID, LEVOTHROID   Take 150 mcg by mouth daily.      lisinopril 40 MG tablet   Commonly known as: PRINIVIL,ZESTRIL   Take 40 mg by mouth every 12 (twelve) hours.      metoprolol succinate 50 MG 24 hr tablet   Commonly known as: TOPROL-XL   Take 50 mg by mouth daily. Take with or immediately following a meal.      omeprazole 20 MG capsule   Commonly known as: PRILOSEC   Take 20 mg by mouth daily.      paliperidone 6 MG 24 hr tablet   Commonly known as: [name]   Take 6 mg by mouth 2 (two) times daily.              The results of significant diagnostics from this hospitalization (including imaging, microbiology, ancillary and laboratory) are listed below for reference.    Significant Diagnostic Studies: Dg  Chest 2 View  01/12/2012  *RADIOLOGY REPORT*  Clinical Data: Shortness of breath, wheezing, cough, congestion, possible pneumonia  CHEST - 2 VIEW  Comparison: 01/10/2012  Findings: Enlargement of cardiac silhouette. Minimally tortuous aorta. Pulmonary vascularity normal. Lungs clear. No pleural effusion or pneumothorax. Underpenetration on lateral view. No acute osseous findings.  IMPRESSION: No acute abnormalities. Enlargement of cardiac silhouette.  Original Report Authenticated By: Lollie Marrow, M.D.   Dg Chest 2 View  01/10/2012  *RADIOLOGY REPORT*  Clinical Data: Shortness of breath.  Hypertension.  CHEST - 2 VIEW  Comparison: 01/04/2012  Findings: Borderline cardiomegaly noted with airway thickening and faint interstitial accentuation.  There is tortuosity of the thoracic aorta.  Thoracic spondylosis noted.  No pleural effusion is observed.  Technical factors related to patient body habitus reduce diagnostic sensitivity and specificity.  IMPRESSION:  1.  Mild airway thickening and faint interstitial accentuation with borderline cardiomegaly. Differential diagnostic considerations include mild bronchitis/atypical pneumonia (favored) over minimal interstitial pulmonary edema. 2.  Tortuous aorta. 3.  Thoracic spondylosis.  Original Report Authenticated By: Dellia Cloud, M.D.   Dg Chest 2 View  01/04/2012  *RADIOLOGY REPORT*  Clinical Data: Pain and shortness of breath  CHEST - 2 VIEW  Comparison: 01/02/2012  Findings: The heart and pulmonary vascularity are within normal limits.  The lungs are clear bilaterally.  No bony abnormality is seen.  IMPRESSION: No acute intrathoracic abnormality.  Original Report Authenticated By: Phillips Odor, M.D.   Dg Chest 2 View  01/02/2012  *RADIOLOGY REPORT*  Clinical Data: Left side chest pain, heart attack symptoms, history asthma, COPD, CHF, diabetes, hypertension  CHEST - 2 VIEW  Comparison: 12/26/2011  Findings: Enlargement of cardiac silhouette with  pulmonary vascular congestion. Tortuous aorta with atherosclerotic calcification at arch. Decreased lung volumes with crowding of perihilar markings. No definite acute infiltrate, pleural effusion or pneumothorax. Minimal right basilar atelectasis. No acute osseous findings.  IMPRESSION: Enlargement of cardiac silhouette with pulmonary vascular congestion. Low lung volumes with minimal right basilar atelectasis.  Original Report Authenticated By: Lollie Marrow, M.D.   Dg Chest 2 View  12/16/2011  *RADIOLOGY REPORT*  Clinical Data: Chest pain and shortness of breath  CHEST - 2 VIEW  Comparison: 12/06/2011  Findings:  Heart size is normal.  No pleural effusion or edema.  No airspace consolidation identified.  There is multilevel spondylosis within the thoracic spine.  IMPRESSION:  1.  No acute cardiopulmonary abnormalities. 2.  Thoracic spondylosis.  Original Report Authenticated By: Rosealee Albee, M.D.   Ct Angio Chest W/cm &/or Wo Cm  01/02/2012  *RADIOLOGY REPORT*  Clinical Data: Chest pain and shortness of breath.  CT ANGIOGRAPHY CHEST  Technique:  Multidetector CT imaging of the chest using the standard protocol during bolus administration of intravenous contrast. Multiplanar reconstructed images including MIPs were obtained and reviewed to evaluate the vascular anatomy.  Contrast: OMNIPAQUE IOHEXOL 350 MG/ML SOLN  Comparison: Chest CT 04/16/2008.  Findings:  Mediastinum: Study is limited by a large amount of patient motion, image noise from the patient's large body habitus, and suboptimal contrast bolus.  Within these limitations, there is no evidence of central, lobar or proximal segmental sized pulmonary embolism. Unfortunately, more distal segmental and subsegmental sized embolism cannot be completely excluded on this examination. Heart size is mildly enlarged. There is no significant pericardial fluid, thickening or pericardial calcification. No pathologically enlarged mediastinal or hilar  lymph nodes. Esophagus is unremarkable in appearance.  Lungs/Pleura: No consolidative airspace disease.  No pleural effusions.  The no definite suspicious appearing pulmonary nodules or masses are identified.  Upper Abdomen: Unremarkable.  Musculoskeletal: There are no aggressive appearing lytic or blastic lesions noted in the visualized portions of the skeleton.  IMPRESSION: 1.  Limited examination demonstrating no evidence of central, lobar or proximal segmental sized pulmonary embolism. 2.  Mild cardiomegaly.  Original Report Authenticated By: Florencia Reasons, M.D.   Nm Myocar Multi W/spect W/wall Motion / Ef  12/28/2011  Clinical Data:  Chest pain.  Technique:  Standard myocardial SPECT imaging performed after resting intravenous injection of Tc-75m tetrofosmin.  Subsequently, stress in the form of Lexiscan and was administered under the supervision of the Cardiology staff.  At peak stress, Tc-49m tetrofosmin was injected intravenously and  standard myocardial SPECT imaging performed.  Quantitative gated imaging also performed to evaluate left ventricular wall motion and estimate left ventricular ejection fraction.  Radiopharmaceutical: Tc-59m tetrofosmin, 30 mCi at rest and 30 mCi at stress.  Comparison:  None  MYOCARDIAL IMAGING WITH SPECT (REST AND STRESS)  Findings:  Extensive motion artifact causes an elongated and "smeared" appearance of the left ventricle on the stress images. Accounting for this, no inducible ischemia is observed.  LEFT VENTRICULAR EJECTION FRACTION  Findings: I reprocessed the images on today's exam to try to minimize the fact of the motion artifact/snaring.  Left ventricular end-diastolic volume is 113 cc.  End-systolic volume is 59 cc. Derived LV ejection fraction is 47%.  GATED LEFT VENTRICULAR WALL MOTION STUDY  Findings:  Left ventricular wall motion wall thickening are within normal limits.  IMPRESSION:  1.  Reduced sensitivity and specificity due to motion artifact.  No  observed inducible ischemia.  Increased emphasis on the ECG portion of the exam is recommended due to the motion artifact.  Original Report Authenticated By: Dellia Cloud, M.D.   Dg Chest Portable 1 View  12/26/2011  *RADIOLOGY REPORT*  Clinical Data: Chest pain, shortness of breath  PORTABLE CHEST - 1 VIEW  Comparison: 12/17/2011  Findings: Limited exam because of patient body habitus and portable technique.  Heart is enlarged with vascular congestion and basilar atelectasis.  Decreased lung volumes evident.  No definite focal pneumonia, collapse, consolidation, effusion or pneumothorax.  IMPRESSION: Low volume exam with cardiomegaly and vascular congestion  Original Report Authenticated By: Judie Petit. Ruel Favors, M.D.   Dg Chest Port 1 View  12/17/2011  *RADIOLOGY REPORT*  Clinical Data: Chest pain, CHF  PORTABLE CHEST - 1 VIEW  Comparison: 12/16/2011  Findings: Degraded by hypoaeration and patient body habitus.  Heart size upper normal limits.  Central vascular congestion. No overt edema or focal consolidation.  No large effusion.  No pneumothorax. No acute osseous finding.  Multilevel degenerative change.  IMPRESSION: Cardiomegaly without overt edema or focal consolidation.  Original Report Authenticated By: Waneta Martins, M.D.   Dg Shoulder Left  01/11/2012  *RADIOLOGY REPORT*  Clinical Data: Left shoulder pain.  No injury.  LEFT SHOULDER - 2+ VIEW  Comparison: 11/07/2011  Findings: Limited range of motion is noted comparing the internal and external views.  No obvious fracture or dislocation.  Chronic changes at the glenohumeral joint are noted.  IMPRESSION: No acute bony pathology.  Degenerative change.  Original Report Authenticated By: Donavan Burnet, M.D.    Microbiology: Recent Results (from the past 240 hour(s))  CULTURE, BLOOD (ROUTINE X 2)     Status: Normal (Preliminary result)   Collection Time   01/11/12  2:45 AM      Component Value Range Status Comment   Specimen Description  BLOOD LEFT ANTECUBITAL   Final    Special Requests     Final    Value: Normal BOTTLES DRAWN AEROBIC AND ANAEROBIC 4CC AEROBIC/3CC ANAEROBIC   Culture  Setup Time 01/11/2012 09:46   Final    Culture     Final    Value:        BLOOD CULTURE RECEIVED NO GROWTH TO DATE CULTURE WILL BE HELD FOR 5 DAYS BEFORE ISSUING A FINAL NEGATIVE REPORT   Report Status PENDING   Incomplete   CULTURE, BLOOD (ROUTINE X 2)     Status: Normal (Preliminary result)   Collection Time   01/11/12  2:50 AM      Component Value  Range Status Comment   Specimen Description BLOOD L ARM   Final    Special Requests Normal BOTTLES DRAWN AEROBIC ONLY 5CC   Final    Culture  Setup Time 01/11/2012 09:46   Final    Culture     Final    Value:        BLOOD CULTURE RECEIVED NO GROWTH TO DATE CULTURE WILL BE HELD FOR 5 DAYS BEFORE ISSUING A FINAL NEGATIVE REPORT   Report Status PENDING   Incomplete      Labs: Basic Metabolic Panel:  Lab 01/12/12 4540 01/10/12 2142  NA 133* 125*  K 4.2 4.5  CL 96 87*  CO2 32 32  GLUCOSE 99 112*  BUN 9 8  CREATININE 0.90 0.95  CALCIUM 9.3 9.6  MG -- --  PHOS -- --   Liver Function Tests: No results found for this basename: AST:5,ALT:5,ALKPHOS:5,BILITOT:5,PROT:5,ALBUMIN:5 in the last 168 hours No results found for this basename: LIPASE:5,AMYLASE:5 in the last 168 hours No results found for this basename: AMMONIA:5 in the last 168 hours CBC:  Lab 01/12/12 0440 01/10/12 2142  WBC 4.2 4.6  NEUTROABS -- 2.3  HGB 11.5* 12.3  HCT 34.8* 37.2  MCV 100.9* 100.5*  PLT 129* 143*   Cardiac Enzymes:  Lab 01/11/12 1655 01/11/12 0845 01/11/12 0250  CKTOTAL 37 29 21  CKMB 1.6 1.7 1.7  CKMBINDEX -- -- --  TROPONINI <0.30 <0.30 <0.30   BNP: BNP (last 3 results)  Basename 01/10/12 2142 12/16/11 1936 10/27/11 0027  PROBNP 89.4 83.7 115.7   CBG:  Lab 01/12/12 1123 01/12/12 0729 01/11/12 2256 01/11/12 1641 01/11/12 1147  GLUCAP 99 91 117* 166* 157*    Time coordinating discharge:  45  Signed:  Seleny Allbright, JAI-GURMUKH  [facility] 01/12/2012, 2:05 PM

## 2012-01-12 NOTE — Progress Notes (Signed)
Pt states she called family to transport home.

## 2012-01-12 NOTE — Progress Notes (Signed)
Patient discharged to home.  IV in left forearm removed.  Reviewed discharge instructions with patient.  Assessed for questions at this time.  No further questions.  Patient given prescriptions to be filled at pharmacy.  Patient's fiance' waiting outside to take patient home.  Patient escorted to emergency room exit by nursing tech via wheelchair.  Patient discharged.

## 2012-01-12 NOTE — Progress Notes (Signed)
Bag of patient's medications sent home with patient's fiance' Mr. [name].  Patient is aware.

## 2012-01-12 NOTE — Progress Notes (Signed)
Patient became very upset when transporter came into room this am to take her down for chest xray.  Patient stated, "I've been stuck, moved around and everything, I'm done!!"  Page placed to Dr. Mahala Menghini.  Will continue to monitor.

## 2012-01-12 NOTE — Clinical Documentation Improvement (Signed)
BMI DOCUMENTATION CLARIFICATION QUERY  THIS DOCUMENT IS NOT A PERMANENT PART OF THE MEDICAL RECORD  TO RESPOND TO THE THIS QUERY, FOLLOW THE INSTRUCTIONS BELOW:  1. If needed, update documentation for the patient's encounter via the notes activity.  2. Access this query again and click edit on the In [employer].  3. After updating, or not, click F2 to complete all highlighted (required) fields concerning your review. Select "additional documentation in the medical record" OR "no additional documentation provided".  4. Click Sign note button.  5. The deficiency will fall out of your In Basket *Please let us know if you are not able to complete this workflow by phone or e-mail (listed below).         01/12/12  Dear Dr. Mahala Menghini Marton Redwood  In an effort to better capture your patient's severity of illness, reflect appropriate length of stay and utilization of resources, a review of the patient medical record has revealed the following indicators.    Based on your clinical judgment, please clarify and document in a progress note and/or discharge summary the clinical condition associated with the following supporting information:  In responding to this query please exercise your independent judgment.  The fact that a query is asked, does not imply that any particular answer is desired or expected. Based on your clinical judgment, please document in the progress notes and discharge summary if condition below provides greater specificity regarding the patient's height and weight:  - Morbid Obesity, BMI   Morbid Obesity W/ BMI=65.3  Other condition_  Cannot Clinically determine _  Risk Factors: CAP, Hyponatremia, COPD, DM   Signs & Symptoms: Weight: 332lbs   Height: 76ft    BMI = 65.3  Treatment: Diet Carb Modified Nutrition Note  Medications: Protonix      Reviewed: additional documentation in the medical record per MD on d/c summary GT  Thank You,  Andy Gauss  RN  Clinical Documentation Specialist:  Pager [phone] E-mail [email or web address]  Health Information Management [place]

## 2012-01-12 NOTE — Progress Notes (Signed)
Met with Pt and fiance this morning to discuss d/c plans.  CSW, Pt and her fiance discussed the importance of Pt continuing with her medication regimen.  Pt's fiance stated that Pt will often take all of her medications at one time and in doing so, will inadvertently drop a pill or 2. Pt able to verbalize, again, the reasons that her medications are important.  Pt and fiance informed CSW that Pt is followed by the PACT team through [facility] and that they visit with her every Tuesday.  Fiance stated that Dr. Hortencia Pilar is in with this group and that he closely monitors Pt's medication.  Fiance stated that Dr. Hortencia Pilar last visited with Pt in her home 6-7 months ago.  Pt to be followed by [facility], as well.  CSW thanked Pt and fiance for their time.  CSW unable to locate contact information for the PACT team.  No further psych needs identified.  Psych CSW to sign off.  Providence Crosby, LCSWA Clinical Social Work [phone]

## 2012-01-12 NOTE — Progress Notes (Signed)
NP up on floor and at bedside to talk with pt about going home AMA

## 2012-01-12 NOTE — Progress Notes (Signed)
Upon entering room pt states, "i want to go home. i do not need further tx." pt also states her left arm is hurting her where the iv is located. IV flushes well no s/s of infiltration or phlebitis noted. Good blood return noted. Midlevel paged awaiting call back.

## 2012-01-12 NOTE — Progress Notes (Signed)
Patient now agreeable to chest xray

## 2012-01-12 NOTE — Progress Notes (Signed)
Pt refused to have tele leads on. Pt took leads off. Monitor tech informed and charge nurse informed. Still awaiting midlevel call back.

## 2012-01-14 IMAGING — CT CT HEAD W/O CM
1 of 2 series · 16 of 30 positions shown, 20 images · non-contrast
Comparison: 03/29/2010

CLINICAL DATA: Syncope, headache

CT HEAD WITHOUT CONTRAST
TECHNIQUE: Contiguous axial images were obtained from the base of
the skull through the vertex without contrast.

[Series 3: recon 2: brain · axial · 0.49mm/px · z∈[+118,+242]mm · 16 of 80 slices shown, 20 images]
[im 5/80  brain]
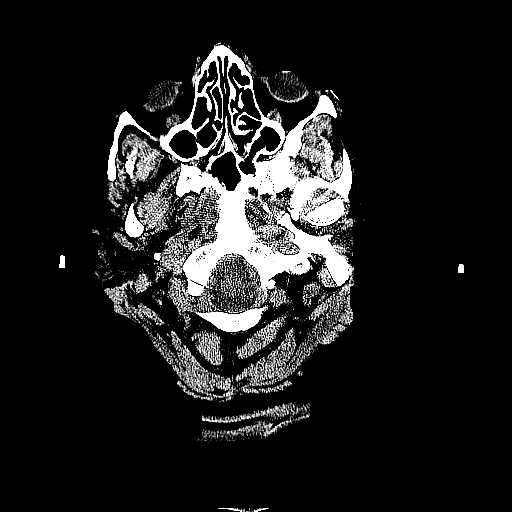
[im 5/80  bone]
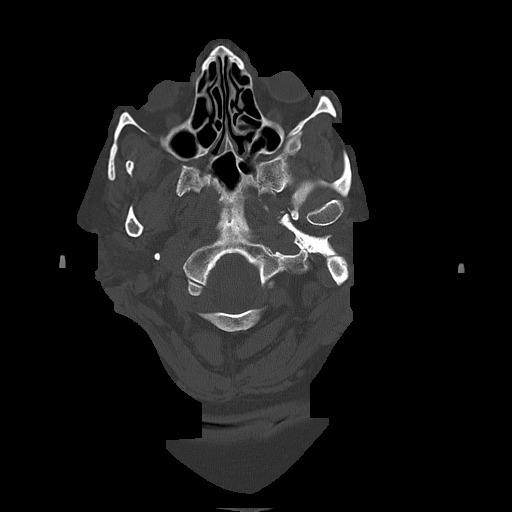
[im 9/80  brain]
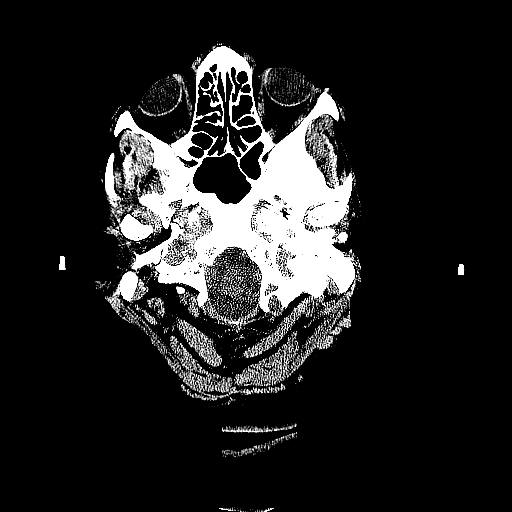
[im 13/80  brain]
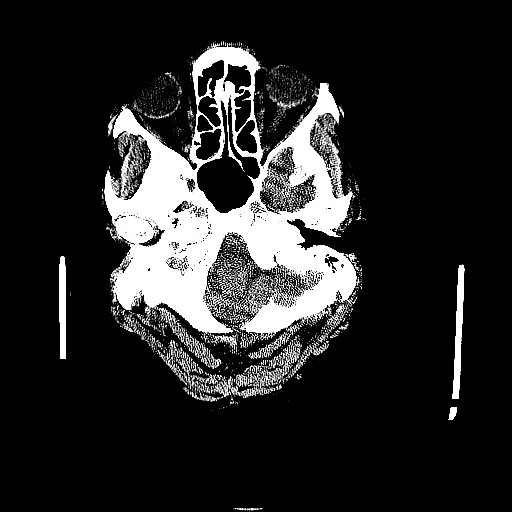
[im 17/80  brain]
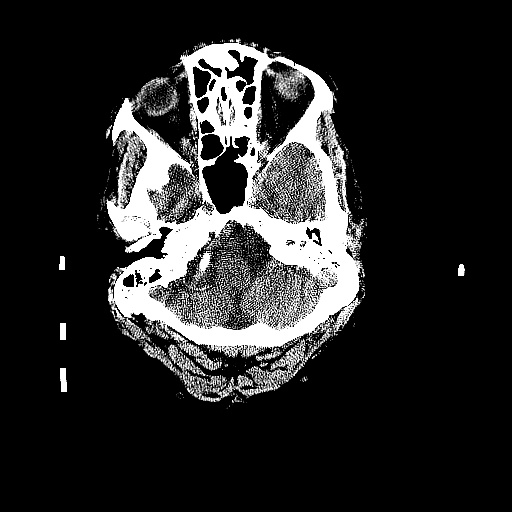
[im 25/80  brain]
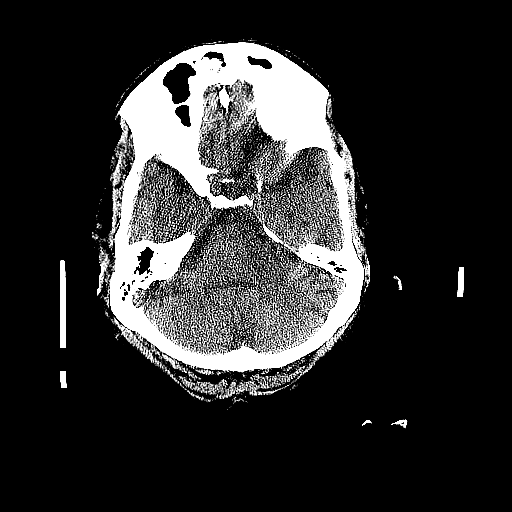
[im 25/80  bone]
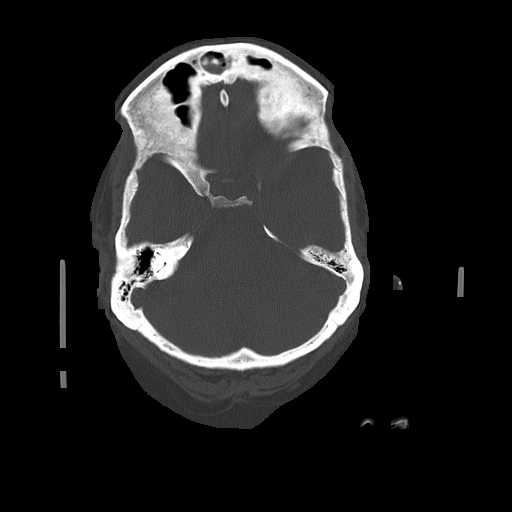
[im 30/80  brain]
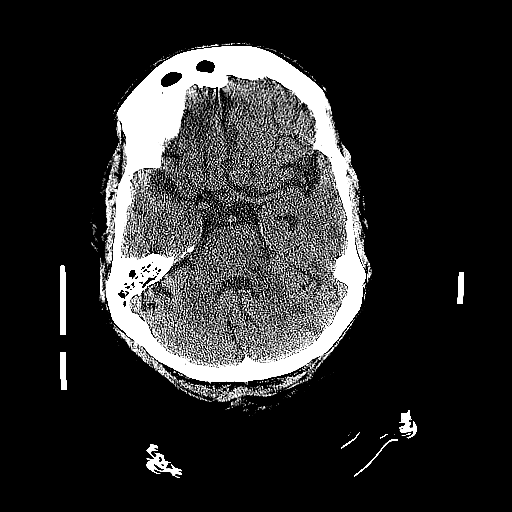
[im 34/80  brain]
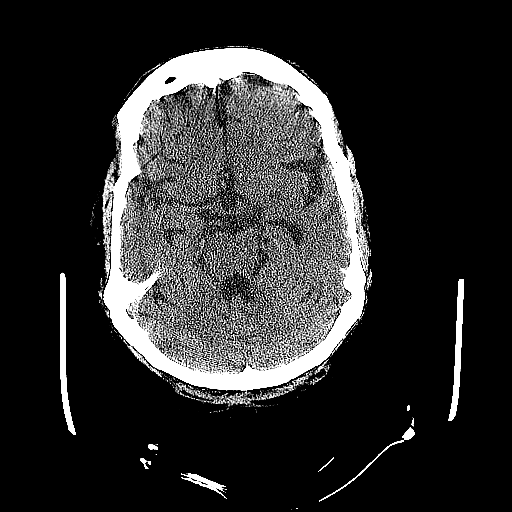
[im 38/80  brain]
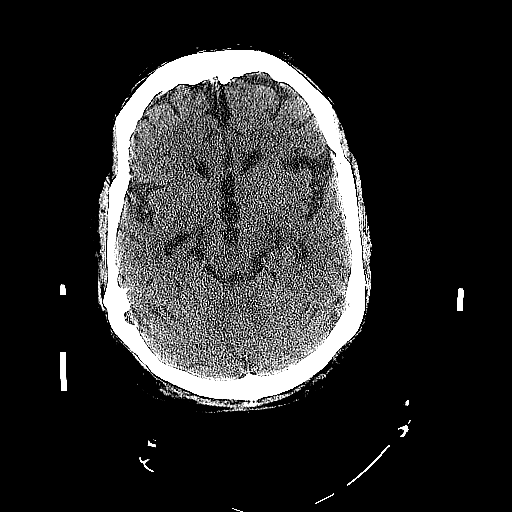
[im 42/80  brain]
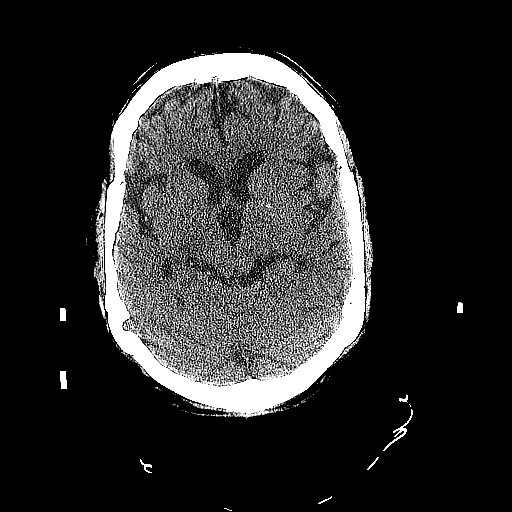
[im 42/80  bone]
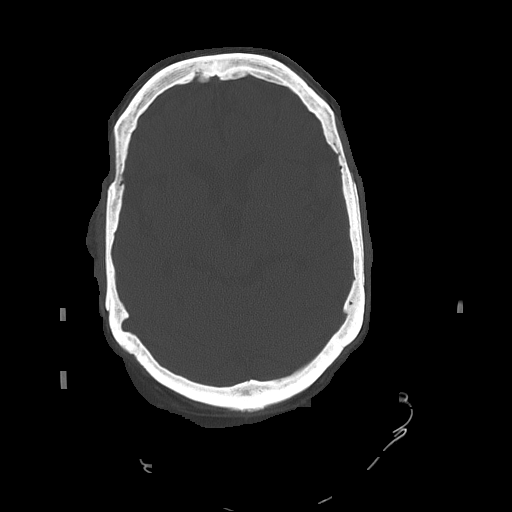
[im 46/80  brain]
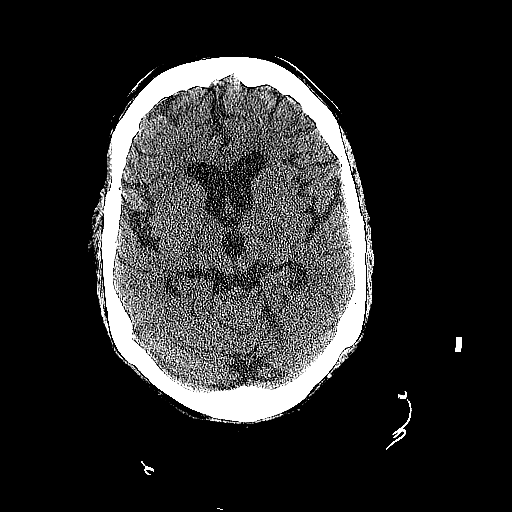
[im 50/80  brain]
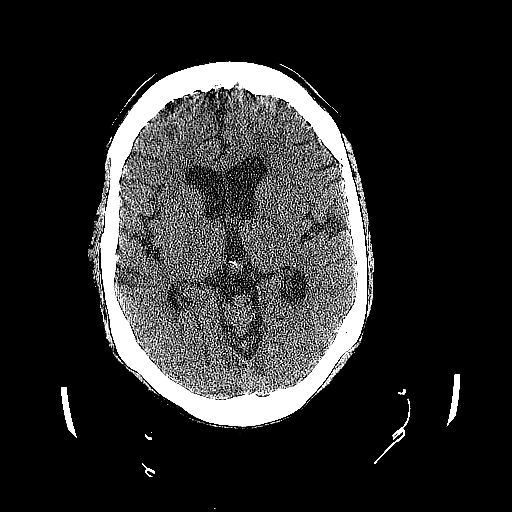
[im 55/80  brain]
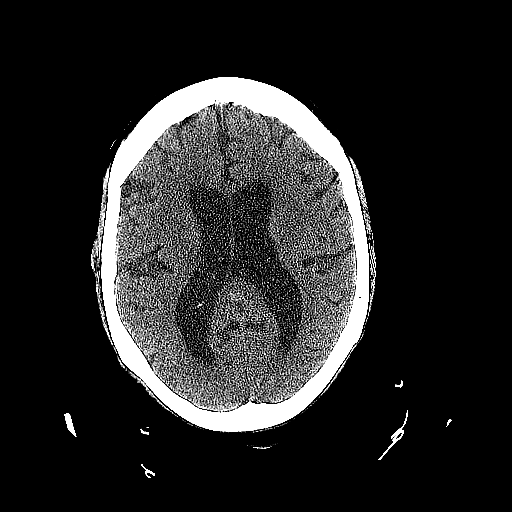
[im 63/80  brain]
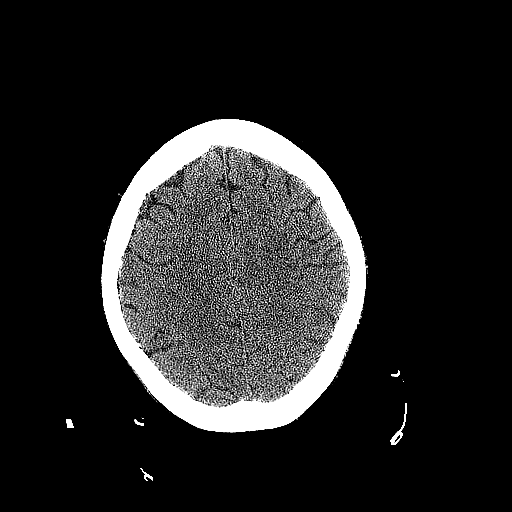
[im 63/80  bone]
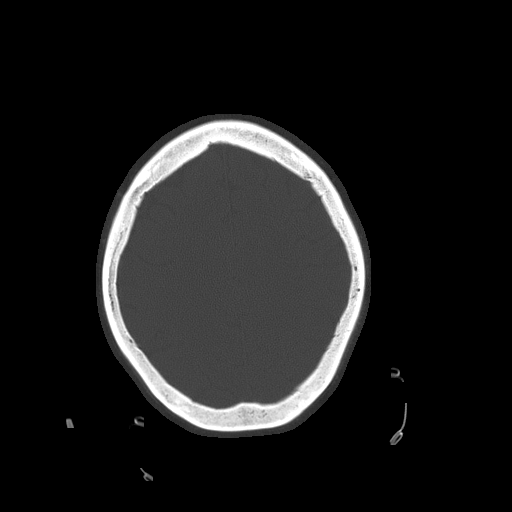
[im 67/80  brain]
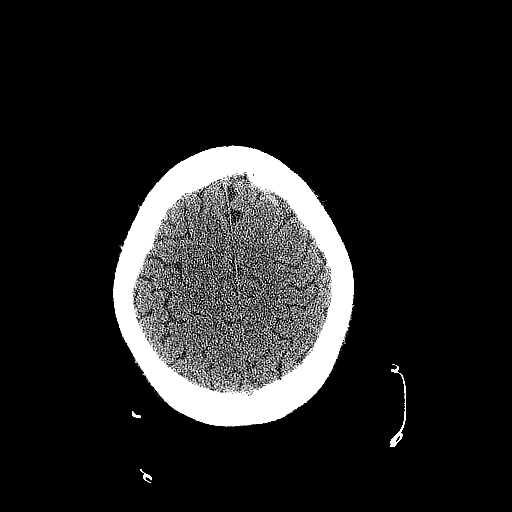
[im 71/80  brain]
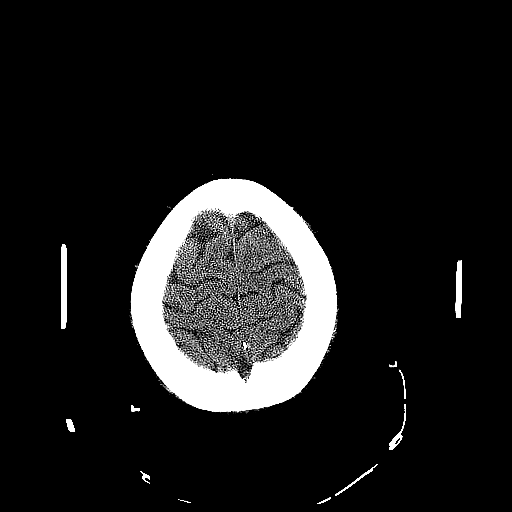
[im 75/80  brain]
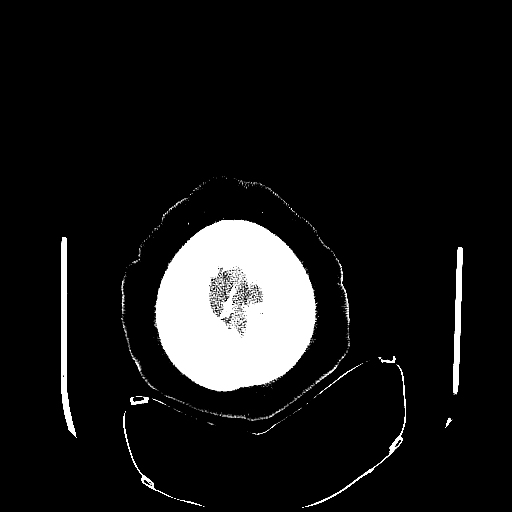

[16 of 30 positions shown; findings below may reference images not displayed]

FINDINGS: Mild atrophy.
Normal ventricular morphology.
No midline shift or mass effect.
Otherwise normal appearance of brain parenchyma.
No intracranial hemorrhage, mass lesion, or evidence of acute
infarction.
Partial opacification of right frontal sinus and right mastoid air
cells.
Question osteoma right frontal sinus.
Bones otherwise unremarkable.
IMPRESSION: No acute intracranial abnormalities.
Partial opacification of right frontal sinus and right mastoid air
cells question sinusitis/mastoiditis.

## 2012-01-17 LAB — CULTURE, BLOOD (ROUTINE X 2)
Culture: NO GROWTH
Special Requests: NORMAL

## 2012-01-19 ENCOUNTER — Other Ambulatory Visit: Payer: Self-pay

## 2012-01-19 ENCOUNTER — Encounter (HOSPITAL_COMMUNITY): Payer: Self-pay | Admitting: Emergency Medicine

## 2012-01-19 ENCOUNTER — Emergency Department (HOSPITAL_COMMUNITY): Payer: Medicare Other

## 2012-01-19 ENCOUNTER — Emergency Department (HOSPITAL_COMMUNITY)
Admission: EM | Admit: 2012-01-19 | Discharge: 2012-01-19 | Disposition: A | Discharge status: Home / Self Care | Payer: Medicare Other | Attending: Emergency Medicine | Admitting: Emergency Medicine

## 2012-01-19 DIAGNOSIS — J449 Chronic obstructive pulmonary disease, unspecified: Secondary | ICD-10-CM | POA: Insufficient documentation

## 2012-01-19 DIAGNOSIS — I10 Essential (primary) hypertension: Secondary | ICD-10-CM | POA: Insufficient documentation

## 2012-01-19 DIAGNOSIS — E119 Type 2 diabetes mellitus without complications: Secondary | ICD-10-CM | POA: Insufficient documentation

## 2012-01-19 DIAGNOSIS — K219 Gastro-esophageal reflux disease without esophagitis: Secondary | ICD-10-CM | POA: Insufficient documentation

## 2012-01-19 DIAGNOSIS — J069 Acute upper respiratory infection, unspecified: Secondary | ICD-10-CM | POA: Insufficient documentation

## 2012-01-19 DIAGNOSIS — I252 Old myocardial infarction: Secondary | ICD-10-CM | POA: Insufficient documentation

## 2012-01-19 DIAGNOSIS — F17200 Nicotine dependence, unspecified, uncomplicated: Secondary | ICD-10-CM | POA: Insufficient documentation

## 2012-01-19 DIAGNOSIS — I4891 Unspecified atrial fibrillation: Secondary | ICD-10-CM | POA: Insufficient documentation

## 2012-01-19 DIAGNOSIS — J4489 Other specified chronic obstructive pulmonary disease: Secondary | ICD-10-CM | POA: Insufficient documentation

## 2012-01-19 DIAGNOSIS — F259 Schizoaffective disorder, unspecified: Secondary | ICD-10-CM | POA: Insufficient documentation

## 2012-01-19 DIAGNOSIS — I509 Heart failure, unspecified: Secondary | ICD-10-CM | POA: Insufficient documentation

## 2012-01-19 LAB — CBC WITH DIFFERENTIAL/PLATELET
Basophils Absolute: 0 10*3/uL (ref 0.0–0.1)
HCT: 35 % — ABNORMAL LOW (ref 36.0–46.0)
Hemoglobin: 11.5 g/dL — ABNORMAL LOW (ref 12.0–15.0)
Lymphocytes Relative: 41 % (ref 12–46)
Monocytes Absolute: 0.8 10*3/uL (ref 0.1–1.0)
Neutro Abs: 2.7 10*3/uL (ref 1.7–7.7)
Neutrophils Relative %: 45 % (ref 43–77)
RDW: 14.3 % (ref 11.5–15.5)
WBC: 5.9 10*3/uL (ref 4.0–10.5)

## 2012-01-19 LAB — BASIC METABOLIC PANEL
CO2: 32 mEq/L (ref 19–32)
Chloride: 97 mEq/L (ref 96–112)
Creatinine, Ser: 0.82 mg/dL (ref 0.50–1.10)
GFR calc Af Amer: 90 mL/min — ABNORMAL LOW (ref >90)
Potassium: 3.9 mEq/L (ref 3.5–5.1)

## 2012-01-19 MED ORDER — ASPIRIN 81 MG PO CHEW
324.0000 mg | CHEWABLE_TABLET | Freq: Once | ORAL | Status: AC
  Administered 2012-01-19: 324 mg via ORAL
  Filled 2012-01-19: qty 4

## 2012-01-19 MED ORDER — ALBUTEROL SULFATE HFA 108 (90 BASE) MCG/ACT IN AERS: 1.0000 | INHALATION_SPRAY | Freq: Four times a day (QID) | RESPIRATORY_TRACT | Status: DC | PRN

## 2012-01-19 MED ORDER — ACETAMINOPHEN 325 MG PO TABS
650.0000 mg | ORAL_TABLET | Freq: Once | ORAL | Status: AC
  Administered 2012-01-19: 650 mg via ORAL
  Filled 2012-01-19: qty 2

## 2012-01-19 MED ORDER — ALBUTEROL SULFATE HFA 108 (90 BASE) MCG/ACT IN AERS
2.0000 | INHALATION_SPRAY | RESPIRATORY_TRACT | Status: DC | PRN
  Administered 2012-01-19: 2 via RESPIRATORY_TRACT
  Filled 2012-01-19: qty 6.7

## 2012-01-19 NOTE — ED Notes (Signed)
Pt states she was seen recently at [facility] dx pneumonia. Pt was seen at [facility] 01/02/12 dx nonspecific chest pain. Pt states her chest is sore.

## 2012-01-19 NOTE — ED Provider Notes (Signed)
History     CSN: [id number]  Arrival date & time 01/19/12  1124   First MD Initiated Contact with Patient 01/19/12 1206      Chief Complaint  Patient presents with  . Chest Pain    (Consider location/radiation/quality/duration/timing/severity/associated sxs/prior treatment) HPI  Patient presents to the ER with complaints of "Ihave pneumonia". The patient states that she had the same thing three days ago when she went to [facility] and they wanted to admit her but she tells me she ran out of the hospital. She states that she "hopes she doesn't need to be admitted today". When I tell her I hope not she tells me "she can not go home with pneumonia". The patient is voluntarily making harsh sounds with her breath and saying "see, i have pneumonia". Her vital signs are stable, her oxygen saturation is 100 percent. Afebrile. When she gets distracted she no longer makes odd breath sounds. Pt is alert. PMH  + for schizzoaffective disorder. She has been seen in the ER numer  Past Medical History  Diagnosis Date  . Schizoaffective disorder   . Urinary tract infection   . Hypertension   . Atrial fibrillation   . GERD (gastroesophageal reflux disease)   . Hypothyroidism   . Myocardial infarction   . Diabetes mellitus   . Obesity   . Asthma   . COPD (chronic obstructive pulmonary disease)   . Pneumonia   . CHF (congestive heart failure)     Past Surgical History  Procedure Date  . Tubal ligation   . Abdominal hysterectomy     Family History  Problem Relation Age of Onset  . Heart disease Father   . Cancer Mother 55    Breast    History  Substance Use Topics  . Smoking status: Former Smoker -- 1.0 packs/day for 12 years    Quit date: 06/29/2000  . Smokeless tobacco: Current User    Types: Snuff, Chew  . Alcohol Use: No    OB History    Grav Para Term Preterm Abortions TAB SAB Ect Mult Living                  Review of Systems  Unable to get reliable ROS due to pt  mental disorder  Allergies  Sulfamethoxazole w-trimethoprim and Penicillins  Home Medications   Current Outpatient Rx  Name Route Sig Dispense Refill  . ACETAMINOPHEN 325 MG PO TABS Oral Take 650 mg by mouth every 6 (six) hours as needed. For pain    . ALBUTEROL SULFATE HFA 108 (90 BASE) MCG/ACT IN AERS Inhalation Inhale 1 puff into the lungs every 6 (six) hours as needed. For wheezing    . AMLODIPINE BESYLATE 5 MG PO TABS Oral Take 5 mg by mouth daily.    . ASPIRIN EC 81 MG PO TBEC Oral Take 81 mg by mouth daily.     . CHLORPROMAZINE HCL 50 MG PO TABS Oral Take 1-4 tablets (50-200 mg total) by mouth 3 (three) times daily. Take 1 tablet (50MG ) twice daily and 4 tablets (200MG ) at bedtime. 180 tablet 0  . DIVALPROEX SODIUM ER 500 MG PO TB24 Oral Take 2 tablets (1,000 mg total) by mouth 2 (two) times daily. 60 tablet 0  . FLUTICASONE PROPIONATE 50 MCG/ACT NA SUSP Nasal Place 2 sprays into the nose daily as needed. For nasal congestion    . FOLIC ACID 1 MG PO TABS Oral Take 1 mg by mouth every morning.     Marland Kitchen  HYDROCODONE-ACETAMINOPHEN 5-325 MG PO TABS Oral Take 1 tablet by mouth every 6 (six) hours as needed for pain. 30 tablet 0  . LEVOTHYROXINE SODIUM 150 MCG PO TABS Oral Take 150 mcg by mouth daily.    Marland Kitchen LISINOPRIL 40 MG PO TABS Oral Take 40 mg by mouth every 12 (twelve) hours.     Marland Kitchen METOPROLOL SUCCINATE ER 50 MG PO TB24 Oral Take 50 mg by mouth daily. Take with or immediately following a meal.    . OMEPRAZOLE 20 MG PO CPDR Oral Take 20 mg by mouth daily.     Marland Kitchen PALIPERIDONE ER 6 MG PO TB24 Oral Take 1 tablet (6 mg total) by mouth 2 (two) times daily. 60 tablet 0  . ALBUTEROL SULFATE HFA 108 (90 BASE) MCG/ACT IN AERS Inhalation Inhale 1-2 puffs into the lungs every 6 (six) hours as needed for wheezing. 1 Inhaler 0    BP 123/68  Pulse 71  Temp 97.8 F (36.6 C) (Oral)  Resp 16  Ht 5\' 1"  (1.549 m)  Wt 360 lb (163.295 kg)  BMI 68.02 kg/m2  SpO2 93%  Physical Exam  Nursing note and  vitals reviewed. Constitutional: She appears well-developed and well-nourished. No distress.  HENT:  Head: Normocephalic and atraumatic.  Eyes: Pupils are equal, round, and reactive to light.  Neck: Normal range of motion. Neck supple.  Cardiovascular: Normal rate and regular rhythm.   Pulmonary/Chest: Effort normal.  Abdominal: Soft.  Neurological: She is alert.  Skin: Skin is warm and dry.      ED Course  Procedures (including critical care time)  Labs Reviewed  CBC WITH DIFFERENTIAL - Abnormal; Notable for the following:    RBC 3.46 (*)     Hemoglobin 11.5 (*)     HCT 35.0 (*)     MCV 101.2 (*)     Monocytes Relative 13 (*)     All other components within normal limits  BASIC METABOLIC PANEL - Abnormal; Notable for the following:    GFR calc non Af Amer 77 (*)     GFR calc Af Amer 90 (*)     All other components within normal limits   Dg Chest 1 View  01/19/2012  *RADIOLOGY REPORT*  Clinical Data: Fever, shortness of breath  CHEST - 1 VIEW  Comparison: 01/12/2012  Findings: Cardiomegaly again noted.  The exam is limited by patient's large body habitus.  Mild elevation of the right hemidiaphragm.  No acute infiltrate or pulmonary edema.  IMPRESSION: Limited study by patient's large body habitus.  Cardiomegaly again noted.  No active disease.  Original Report Authenticated By: Natasha Mead, M.D.     1. URI (upper respiratory infection)       MDM  . Pt is alert. PMH  + for schizzoaffective disorder. She has been seen in the ER numerous times for chest pain and complaints of pneumonia over the past couple of months. She has had a chest CT angio as well as a nuclear stress test done, both of which are benign. Pt had chest xray done 3 days ago, which was also normal.  I read the note from the patients previous visit pt was supposed to be discharged for pneumonia and hyponatremia. Today her xray is normal and her sodium has corrected itself. She is safe to DC at this time. Pt  instructed to follow-up with her PCP. Pt given albuterol inhaler for home.  Pt has been advised of the symptoms that warrant their return to the  ED. Patient has voiced understanding and has agreed to follow-up with the PCP or specialist.          Dorthula Matas, PA 01/19/12 1419

## 2012-01-19 NOTE — ED Provider Notes (Signed)
11:59 AM  Date: 01/19/2012  Rate: 73  Rhythm: normal sinus rhythm  QRS Axis: normal  Intervals: normal QRS:  Low voltage in frontal leads.  ST/T Wave abnormalities: normal  Conduction Disutrbances:none  Narrative Interpretation: Normal EKG  Old EKG Reviewed: unchanged    Carleene Cooper III, MD 01/19/12 1201

## 2012-01-20 NOTE — ED Provider Notes (Signed)
Medical screening examination/treatment/procedure(s) were performed by non-physician practitioner and as supervising physician I was immediately available for consultation/collaboration.   Carleene Cooper III, MD 01/20/12 1321

## 2012-01-26 ENCOUNTER — Emergency Department (HOSPITAL_COMMUNITY): Payer: Medicare Other

## 2012-01-26 ENCOUNTER — Encounter (HOSPITAL_COMMUNITY): Payer: Self-pay | Admitting: Emergency Medicine

## 2012-01-26 ENCOUNTER — Inpatient Hospital Stay (HOSPITAL_COMMUNITY)
Admission: EM | Admit: 2012-01-26 | Discharge: 2012-01-27 | DRG: 313 | Disposition: A | Discharge status: Home Health | Payer: Medicare Other | Attending: Internal Medicine | Admitting: Internal Medicine

## 2012-01-26 DIAGNOSIS — I10 Essential (primary) hypertension: Secondary | ICD-10-CM | POA: Diagnosis present

## 2012-01-26 DIAGNOSIS — R110 Nausea: Secondary | ICD-10-CM | POA: Diagnosis present

## 2012-01-26 DIAGNOSIS — R45851 Suicidal ideations: Secondary | ICD-10-CM

## 2012-01-26 DIAGNOSIS — R072 Precordial pain: Principal | ICD-10-CM | POA: Diagnosis present

## 2012-01-26 DIAGNOSIS — R531 Weakness: Secondary | ICD-10-CM

## 2012-01-26 DIAGNOSIS — R55 Syncope and collapse: Secondary | ICD-10-CM

## 2012-01-26 DIAGNOSIS — R82998 Other abnormal findings in urine: Secondary | ICD-10-CM

## 2012-01-26 DIAGNOSIS — E538 Deficiency of other specified B group vitamins: Secondary | ICD-10-CM

## 2012-01-26 DIAGNOSIS — E119 Type 2 diabetes mellitus without complications: Secondary | ICD-10-CM | POA: Diagnosis present

## 2012-01-26 DIAGNOSIS — E669 Obesity, unspecified: Secondary | ICD-10-CM | POA: Diagnosis present

## 2012-01-26 DIAGNOSIS — D649 Anemia, unspecified: Secondary | ICD-10-CM

## 2012-01-26 DIAGNOSIS — R197 Diarrhea, unspecified: Secondary | ICD-10-CM | POA: Diagnosis present

## 2012-01-26 DIAGNOSIS — F17200 Nicotine dependence, unspecified, uncomplicated: Secondary | ICD-10-CM | POA: Diagnosis present

## 2012-01-26 DIAGNOSIS — Z6841 Body Mass Index (BMI) 40.0 and over, adult: Secondary | ICD-10-CM

## 2012-01-26 DIAGNOSIS — R509 Fever, unspecified: Secondary | ICD-10-CM | POA: Diagnosis present

## 2012-01-26 DIAGNOSIS — K8020 Calculus of gallbladder without cholecystitis without obstruction: Secondary | ICD-10-CM

## 2012-01-26 DIAGNOSIS — M1710 Unilateral primary osteoarthritis, unspecified knee: Secondary | ICD-10-CM

## 2012-01-26 DIAGNOSIS — R079 Chest pain, unspecified: Secondary | ICD-10-CM | POA: Diagnosis present

## 2012-01-26 DIAGNOSIS — F259 Schizoaffective disorder, unspecified: Secondary | ICD-10-CM | POA: Diagnosis present

## 2012-01-26 DIAGNOSIS — G244 Idiopathic orofacial dystonia: Secondary | ICD-10-CM

## 2012-01-26 DIAGNOSIS — J4489 Other specified chronic obstructive pulmonary disease: Secondary | ICD-10-CM | POA: Diagnosis present

## 2012-01-26 DIAGNOSIS — R112 Nausea with vomiting, unspecified: Secondary | ICD-10-CM

## 2012-01-26 DIAGNOSIS — E039 Hypothyroidism, unspecified: Secondary | ICD-10-CM | POA: Diagnosis present

## 2012-01-26 DIAGNOSIS — I219 Acute myocardial infarction, unspecified: Secondary | ICD-10-CM

## 2012-01-26 DIAGNOSIS — K219 Gastro-esophageal reflux disease without esophagitis: Secondary | ICD-10-CM | POA: Diagnosis present

## 2012-01-26 DIAGNOSIS — G4700 Insomnia, unspecified: Secondary | ICD-10-CM

## 2012-01-26 DIAGNOSIS — F70 Mild intellectual disabilities: Secondary | ICD-10-CM

## 2012-01-26 DIAGNOSIS — F250 Schizoaffective disorder, bipolar type: Secondary | ICD-10-CM | POA: Diagnosis present

## 2012-01-26 DIAGNOSIS — I509 Heart failure, unspecified: Secondary | ICD-10-CM | POA: Diagnosis present

## 2012-01-26 DIAGNOSIS — J45909 Unspecified asthma, uncomplicated: Secondary | ICD-10-CM | POA: Diagnosis present

## 2012-01-26 DIAGNOSIS — J189 Pneumonia, unspecified organism: Secondary | ICD-10-CM

## 2012-01-26 DIAGNOSIS — M545 Low back pain: Secondary | ICD-10-CM

## 2012-01-26 DIAGNOSIS — E871 Hypo-osmolality and hyponatremia: Secondary | ICD-10-CM

## 2012-01-26 DIAGNOSIS — I4891 Unspecified atrial fibrillation: Secondary | ICD-10-CM | POA: Diagnosis present

## 2012-01-26 DIAGNOSIS — Z7982 Long term (current) use of aspirin: Secondary | ICD-10-CM

## 2012-01-26 DIAGNOSIS — G4733 Obstructive sleep apnea (adult) (pediatric): Secondary | ICD-10-CM | POA: Diagnosis present

## 2012-01-26 DIAGNOSIS — Z8679 Personal history of other diseases of the circulatory system: Secondary | ICD-10-CM

## 2012-01-26 DIAGNOSIS — J449 Chronic obstructive pulmonary disease, unspecified: Secondary | ICD-10-CM | POA: Diagnosis present

## 2012-01-26 DIAGNOSIS — Z79899 Other long term (current) drug therapy: Secondary | ICD-10-CM

## 2012-01-26 DIAGNOSIS — R0989 Other specified symptoms and signs involving the circulatory and respiratory systems: Secondary | ICD-10-CM

## 2012-01-26 DIAGNOSIS — R5383 Other fatigue: Secondary | ICD-10-CM

## 2012-01-26 HISTORY — DX: Unspecified convulsions: R56.9

## 2012-01-26 LAB — CBC WITH DIFFERENTIAL/PLATELET
Basophils Relative: 0 % (ref 0–1)
Eosinophils Relative: 1 % (ref 0–5)
HCT: 38.3 % (ref 36.0–46.0)
Hemoglobin: 12.6 g/dL (ref 12.0–15.0)
Lymphocytes Relative: 38 % (ref 12–46)
MCHC: 32.9 g/dL (ref 30.0–36.0)
Monocytes Relative: 12 % (ref 3–12)
Neutro Abs: 3.6 10*3/uL (ref 1.7–7.7)
WBC: 7.5 10*3/uL (ref 4.0–10.5)

## 2012-01-26 LAB — CARDIAC PANEL(CRET KIN+CKTOT+MB+TROPI)
CK, MB: 1.5 ng/mL (ref 0.3–4.0)
Relative Index: INVALID (ref 0.0–2.5)
Total CK: 22 U/L (ref 7–177)
Troponin I: <0.3 ng/mL (ref <0.30)
Troponin I: <0.3 ng/mL (ref <0.30)

## 2012-01-26 LAB — URINALYSIS, ROUTINE W REFLEX MICROSCOPIC
Leukocytes, UA: NEGATIVE
Nitrite: NEGATIVE
Specific Gravity, Urine: 1.011 (ref 1.005–1.030)
pH: 7 (ref 5.0–8.0)

## 2012-01-26 LAB — CBC
HCT: 39.3 % (ref 36.0–46.0)
MCV: 102.3 fL — ABNORMAL HIGH (ref 78.0–100.0)
RDW: 14.7 % (ref 11.5–15.5)
WBC: 6.2 10*3/uL (ref 4.0–10.5)

## 2012-01-26 LAB — BASIC METABOLIC PANEL
BUN: 11 mg/dL (ref 6–23)
Chloride: 98 mEq/L (ref 96–112)
GFR calc Af Amer: 81 mL/min — ABNORMAL LOW (ref >90)
Potassium: 4.1 mEq/L (ref 3.5–5.1)

## 2012-01-26 LAB — CREATININE, SERUM: GFR calc Af Amer: 86 mL/min — ABNORMAL LOW (ref >90)

## 2012-01-26 LAB — GLUCOSE, CAPILLARY

## 2012-01-26 LAB — POCT I-STAT TROPONIN I

## 2012-01-26 MED ORDER — CHLORPROMAZINE HCL 100 MG PO TABS
200.0000 mg | ORAL_TABLET | Freq: Every day | ORAL | Status: DC
  Administered 2012-01-26: 200 mg via ORAL
  Filled 2012-01-26 (×3): qty 2

## 2012-01-26 MED ORDER — LEVOTHYROXINE SODIUM 150 MCG PO TABS
150.0000 ug | ORAL_TABLET | Freq: Every day | ORAL | Status: DC
  Administered 2012-01-26 – 2012-01-27 (×2): 150 ug via ORAL
  Filled 2012-01-26 (×2): qty 1

## 2012-01-26 MED ORDER — ONDANSETRON HCL 4 MG/2ML IJ SOLN
4.0000 mg | Freq: Once | INTRAMUSCULAR | Status: AC
  Administered 2012-01-26: 4 mg via INTRAVENOUS
  Filled 2012-01-26: qty 2

## 2012-01-26 MED ORDER — ALBUTEROL SULFATE HFA 108 (90 BASE) MCG/ACT IN AERS: 1.0000 | INHALATION_SPRAY | Freq: Four times a day (QID) | RESPIRATORY_TRACT | Status: DC | PRN

## 2012-01-26 MED ORDER — CHLORPROMAZINE HCL 50 MG PO TABS
50.0000 mg | ORAL_TABLET | Freq: Two times a day (BID) | ORAL | Status: DC
  Administered 2012-01-26 – 2012-01-27 (×2): 50 mg via ORAL
  Filled 2012-01-26 (×4): qty 1

## 2012-01-26 MED ORDER — DIVALPROEX SODIUM ER 500 MG PO TB24
1000.0000 mg | ORAL_TABLET | Freq: Two times a day (BID) | ORAL | Status: DC
  Administered 2012-01-26 – 2012-01-27 (×3): 1000 mg via ORAL
  Filled 2012-01-26 (×4): qty 2

## 2012-01-26 MED ORDER — PALIPERIDONE ER 6 MG PO TB24
6.0000 mg | ORAL_TABLET | Freq: Two times a day (BID) | ORAL | Status: DC
  Administered 2012-01-26 – 2012-01-27 (×3): 6 mg via ORAL
  Filled 2012-01-26 (×5): qty 1

## 2012-01-26 MED ORDER — SODIUM CHLORIDE 0.9 % IV BOLUS (SEPSIS)
1000.0000 mL | Freq: Once | INTRAVENOUS | Status: AC
  Administered 2012-01-26: 1000 mL via INTRAVENOUS

## 2012-01-26 MED ORDER — SODIUM CHLORIDE 0.9 % IJ SOLN
3.0000 mL | Freq: Two times a day (BID) | INTRAMUSCULAR | Status: DC
  Administered 2012-01-26 (×2): 3 mL via INTRAVENOUS

## 2012-01-26 MED ORDER — ENOXAPARIN SODIUM 40 MG/0.4ML ~~LOC~~ SOLN
40.0000 mg | SUBCUTANEOUS | Status: DC
  Administered 2012-01-26: 40 mg via SUBCUTANEOUS
  Filled 2012-01-26 (×2): qty 0.4

## 2012-01-26 MED ORDER — PANTOPRAZOLE SODIUM 40 MG PO TBEC
40.0000 mg | DELAYED_RELEASE_TABLET | Freq: Every day | ORAL | Status: DC
  Administered 2012-01-26 – 2012-01-27 (×2): 40 mg via ORAL
  Filled 2012-01-26 (×2): qty 1

## 2012-01-26 MED ORDER — FLUTICASONE PROPIONATE 50 MCG/ACT NA SUSP
2.0000 | Freq: Every day | NASAL | Status: DC
  Administered 2012-01-26: 2 via NASAL
  Filled 2012-01-26: qty 16

## 2012-01-26 MED ORDER — AMLODIPINE BESYLATE 5 MG PO TABS
5.0000 mg | ORAL_TABLET | Freq: Every day | ORAL | Status: DC
  Administered 2012-01-26: 5 mg via ORAL
  Filled 2012-01-26 (×2): qty 1

## 2012-01-26 MED ORDER — LISINOPRIL 40 MG PO TABS
40.0000 mg | ORAL_TABLET | Freq: Two times a day (BID) | ORAL | Status: DC
  Administered 2012-01-26 (×2): 40 mg via ORAL
  Filled 2012-01-26 (×5): qty 1

## 2012-01-26 MED ORDER — FOLIC ACID 1 MG PO TABS
1.0000 mg | ORAL_TABLET | Freq: Every morning | ORAL | Status: DC
  Administered 2012-01-26 – 2012-01-27 (×2): 1 mg via ORAL
  Filled 2012-01-26 (×2): qty 1

## 2012-01-26 MED ORDER — METOPROLOL SUCCINATE ER 50 MG PO TB24
50.0000 mg | ORAL_TABLET | Freq: Every day | ORAL | Status: DC
  Administered 2012-01-26 – 2012-01-27 (×2): 50 mg via ORAL
  Filled 2012-01-26 (×2): qty 1

## 2012-01-26 MED ORDER — ONDANSETRON HCL 4 MG/2ML IJ SOLN: 4.0000 mg | Freq: Four times a day (QID) | INTRAMUSCULAR | Status: DC | PRN

## 2012-01-26 MED ORDER — INSULIN ASPART 100 UNIT/ML ~~LOC~~ SOLN: 0.0000 [IU] | Freq: Three times a day (TID) | SUBCUTANEOUS | Status: DC

## 2012-01-26 MED ORDER — FENTANYL CITRATE 0.05 MG/ML IJ SOLN
50.0000 ug | Freq: Once | INTRAMUSCULAR | Status: AC
  Administered 2012-01-26: 50 ug via INTRAVENOUS
  Filled 2012-01-26: qty 2

## 2012-01-26 MED ORDER — ONDANSETRON HCL 4 MG PO TABS: 4.0000 mg | ORAL_TABLET | Freq: Four times a day (QID) | ORAL | Status: DC | PRN

## 2012-01-26 MED ORDER — INSULIN ASPART 100 UNIT/ML ~~LOC~~ SOLN: 0.0000 [IU] | Freq: Every day | SUBCUTANEOUS | Status: DC

## 2012-01-26 MED ORDER — ACETAMINOPHEN 325 MG PO TABS
650.0000 mg | ORAL_TABLET | Freq: Four times a day (QID) | ORAL | Status: DC | PRN
  Administered 2012-01-26 – 2012-01-27 (×3): 650 mg via ORAL
  Filled 2012-01-26 (×3): qty 2

## 2012-01-26 MED ORDER — ASPIRIN EC 81 MG PO TBEC
81.0000 mg | DELAYED_RELEASE_TABLET | Freq: Every day | ORAL | Status: DC
  Administered 2012-01-26 – 2012-01-27 (×2): 81 mg via ORAL
  Filled 2012-01-26 (×2): qty 1

## 2012-01-26 NOTE — ED Notes (Signed)
PT. ARRIVED WITH EMS FROM HOME , REPORTS PAIN ACROSS HER CHEST WITH SOB , PRODUCTIVE COUGH AND NAUSEA ONSET TODAY . PT. RECEIVED 4 BABY ASA PTA.

## 2012-01-26 NOTE — ED Notes (Signed)
Waiting for admission MD to come see pt and writer orders to be admitted.

## 2012-01-26 NOTE — ED Provider Notes (Signed)
History     CSN: [id number]  Arrival date & time 01/26/12  0028   First MD Initiated Contact with Patient 01/26/12 [phone]      Chief Complaint  Patient presents with  . Chest Pain    (Consider location/radiation/quality/duration/timing/severity/associated sxs/prior treatment) HPI 61 year old female presents to emergency room via EMS with complaint of chest pain, generalized fatigue, nausea vomiting and diarrhea, cough. She reports symptoms have been ongoing for the last 2 days. She has had constant chest pain since onset. She has been unable to keep down anything. She's felt sweaty at times, but denies fever. Chest pain is described as a band across her chest, as a heaviness. She does have a history of coronary disease, but denies previous stent. Patient with history of schizoaffective disorder, and is a difficult historian. She reports that she has been having cough productive of green sputum ongoing for weeks. Patient is seen frequently in the emergency department for complaints of chest pain, and pneumonia. Patient reports she wanted to come on Tuesday, but reports the person who stays with her wouldn't let her come. She reports she is too weak to stand, and reports she has had 5 episodes of syncope. Patient reports she has been sitting on the couch when suddenly she "falls out". She thinks she is out for maybe 30 seconds. She has no prodromal symptoms prior to passing out. She denies previous history of syncope.  Past Medical History  Diagnosis Date  . Schizoaffective disorder   . Urinary tract infection   . Hypertension   . Atrial fibrillation   . GERD (gastroesophageal reflux disease)   . Hypothyroidism   . Myocardial infarction   . Diabetes mellitus   . Obesity   . Asthma   . COPD (chronic obstructive pulmonary disease)   . Pneumonia   . CHF (congestive heart failure)     Past Surgical History  Procedure Date  . Tubal ligation   . Abdominal hysterectomy     Family History   Problem Relation Age of Onset  . Heart disease Father   . Cancer Mother 37    Breast    History  Substance Use Topics  . Smoking status: Former Smoker -- 1.0 packs/day for 12 years    Quit date: 06/29/2000  . Smokeless tobacco: Current User    Types: Snuff, Chew  . Alcohol Use: No    OB History    Grav Para Term Preterm Abortions TAB SAB Ect Mult Living                  Review of Systems  Unable to perform ROS: Psychiatric disorder    Allergies  Sulfamethoxazole w-trimethoprim and Penicillins  Home Medications   Current Outpatient Rx  Name Route Sig Dispense Refill  . ACETAMINOPHEN 325 MG PO TABS Oral Take 650 mg by mouth every 6 (six) hours as needed. For pain    . ALBUTEROL SULFATE HFA 108 (90 BASE) MCG/ACT IN AERS Inhalation Inhale 1 puff into the lungs every 6 (six) hours as needed. For wheezing    . AMLODIPINE BESYLATE 5 MG PO TABS Oral Take 5 mg by mouth daily.    . ASPIRIN EC 81 MG PO TBEC Oral Take 81 mg by mouth daily.     . CHLORPROMAZINE HCL 50 MG PO TABS Oral Take 1-4 tablets (50-200 mg total) by mouth 3 (three) times daily. Take 1 tablet (50MG ) twice daily and 4 tablets (200MG ) at bedtime. 180 tablet 0  .  DIVALPROEX SODIUM ER 500 MG PO TB24 Oral Take 2 tablets (1,000 mg total) by mouth 2 (two) times daily. 60 tablet 0  . FLUTICASONE PROPIONATE 50 MCG/ACT NA SUSP Nasal Place 2 sprays into the nose daily as needed. For nasal congestion    . FOLIC ACID 1 MG PO TABS Oral Take 1 mg by mouth every morning.     Marland Kitchen LEVOTHYROXINE SODIUM 150 MCG PO TABS Oral Take 150 mcg by mouth daily.    Marland Kitchen LISINOPRIL 40 MG PO TABS Oral Take 40 mg by mouth every 12 (twelve) hours.     Marland Kitchen METOPROLOL SUCCINATE ER 50 MG PO TB24 Oral Take 50 mg by mouth daily. Take with or immediately following a meal.    . OMEPRAZOLE 20 MG PO CPDR Oral Take 20 mg by mouth daily.     Marland Kitchen PALIPERIDONE ER 6 MG PO TB24 Oral Take 1 tablet (6 mg total) by mouth 2 (two) times daily. 60 tablet 0    BP 123/83   Pulse 72  Temp 98.4 F (36.9 C) (Oral)  Resp 15  SpO2 94%  Physical Exam  Nursing note and vitals reviewed. Constitutional: She is oriented to person, place, and time.       Morbidly obese, uncomfortable appearing  HENT:  Head: Normocephalic and atraumatic.  Nose: Nose normal.  Mouth/Throat: Oropharynx is clear and moist.       Poor dental hygiene  Eyes: Conjunctivae are normal. Pupils are equal, round, and reactive to light.  Neck: Normal range of motion. Neck supple. No JVD present. No tracheal deviation present. No thyromegaly present.  Cardiovascular: Normal rate, regular rhythm, normal heart sounds and intact distal pulses.  Exam reveals no gallop and no friction rub.   No murmur heard. Pulmonary/Chest: Effort normal and breath sounds normal. No stridor. No respiratory distress. She has no wheezes. She has no rales. She exhibits no tenderness.  Abdominal: Soft. Bowel sounds are normal. She exhibits no distension and no mass. There is tenderness. There is no rebound and no guarding.  Musculoskeletal: She exhibits edema. She exhibits no tenderness.  Lymphadenopathy:    She has no cervical adenopathy.  Neurological: She is alert and oriented to person, place, and time. No cranial nerve deficit. She exhibits normal muscle tone. Coordination normal.  Skin: Skin is warm and dry. No rash noted. No erythema. No pallor.  Psychiatric:       Flat affect    ED Course  Procedures (including critical care time)  Labs Reviewed  BASIC METABOLIC PANEL - Abnormal; Notable for the following:    CO2 33 (*)     Glucose, Bld 113 (*)     GFR calc non Af Amer 70 (*)     GFR calc Af Amer 81 (*)     All other components within normal limits  CBC WITH DIFFERENTIAL - Abnormal; Notable for the following:    RBC 3.75 (*)     MCV 102.1 (*)     All other components within normal limits  URINALYSIS, ROUTINE W REFLEX MICROSCOPIC - Abnormal; Notable for the following:    APPearance HAZY (*)     All  other components within normal limits  POCT I-STAT TROPONIN I   Dg Chest 2 View  01/26/2012  *RADIOLOGY REPORT*  Clinical Data: Cough.  Shortness of breath.  Chest pain.  CHEST - 2 VIEW  Comparison: 01/19/2012  Findings: Shallow inspiration. The heart size and pulmonary vascularity are normal. The lungs appear clear and  expanded without focal air space disease or consolidation. No blunting of the costophrenic angles.  No pneumothorax.  Mediastinal contours appear intact.  Degenerative changes in the spine.  No significant change since previous study.  IMPRESSION: No evidence of active pulmonary disease.  Original Report Authenticated By: Marlon Pel, M.D.    Date: 01/26/2012  Rate: 77  Rhythm: normal sinus rhythm  QRS Axis: normal  Intervals: normal  ST/T Wave abnormalities: nonspecific T wave changes  Conduction Disutrbances:none  Narrative Interpretation:   Old EKG Reviewed: unchanged   1. Weakness   2. Syncope   3. Chest pain   4. Nausea vomiting and diarrhea       MDM  61 year old female with recent nausea vomiting and diarrhea who reports syncope, also with constant chest pain for 3 days. Patient with mild orthostatic changes and possible dehydration. Vital signs have improved with IV fluids. Patient still unable to stand or ambulate. Will discuss with hospitalist for admission for generalized weakness and syncope        Olivia Mackie, MD 01/26/12 [phone]

## 2012-01-26 NOTE — ED Notes (Signed)
Family at bedside. 

## 2012-01-26 NOTE — ED Notes (Signed)
Admission MD at bedside.  

## 2012-01-26 NOTE — ED Notes (Signed)
Pt said that she is unable to stand she stated "I am to sore"

## 2012-01-26 NOTE — ED Notes (Signed)
NURSE CALLED RADIOLOGY TO FOLLOW UP ON PT'S CHEST X-RAY RESULT.

## 2012-01-26 NOTE — ED Notes (Signed)
DIABETIC BREAKFAST FOR PT. REQUESTED BY SECRETARY.

## 2012-01-26 NOTE — H&P (Signed)
[facility] History and Physical  MARINE LEZOTTE ZOX:[id number] DOB: 11/13/1950 DOA: 01/26/2012  PCP: Julieanne Manson, MD   Chief Complaint: chest pain  Since this morning.   HPI:  61 year old lady with h/o Hypertension, Schizoaffective disorder, Obesity, Atrial fibrillation, Diabetes, recently discharged from the hospital after being treated for HCAP. Comes in for chest pain this morning, associated with nausea. Chest pain is precordial, non radiating, not associated with diaphoresis or activity, is intermittent. She reports nausea, no vomiting, and loose stools for 2 weeks now. She denies any fever or chills. She reports passing out in the last few weeks, denies hitting her head. No witnesses to any of the falls. No dizziness or palpitations. She reports has a h/o of CHF, atrial fibrillation , but her anticoagulants were stopped. On arrival to ED, she had a CXR and UA which did not show pneumonia or UTI. Her EKG is sinus rhythm, without ST T wave changes. She had a NM stress test last month which was negative for inducible ischemia. Her LVEF was 47%. She is being admitted for evaluation of syncope and atypical chest pain.   Review of Systems:   Constitutional: Negative for malaise, fever and chills. No significant weight loss or weight gain  Eyes: Negative for eye pain, redness and discharge, or flashes of light.  ENMT: Negative for ear pain, hoarseness, nasal congestion, sinus pressure and sore throat. No headaches; tinnitus, drooling, or problem swallowing.  Cardiovascular: Negative for  palpitations, diaphoresis,  No orthopnea, PND  Respiratory: Negative for cough, hemoptysis, wheezing and stridor. No pleuritic chestpain.  Gastrointestinal: Negative for diarrhea, constipation, abdominal pain, melena, blood in stool, hematemesis, jaundice and rectal bleeding.  Genitourinary: Negative for frequency, dysuria, incontinence,flank pain and hematuria;  Musculoskeletal: Negative for back  pain and neck pain. Negative for swelling and trauma.;  Skin: . Negative for pruritus, rash, abrasions, bruising and skin lesion.; ulcerations  Neuro: Negative for headache, lightheadedness and neck stiffness. Negative for weakness, altered level of consciousness , altered mental status, extremity weakness, burning feet, involuntary movement, Psych: negative for anxiety, depression, insomnia,.   Past Medical History  Diagnosis Date  . Schizoaffective disorder   . Urinary tract infection   . Hypertension   . Atrial fibrillation   . GERD (gastroesophageal reflux disease)   . Hypothyroidism   . Myocardial infarction   . Diabetes mellitus   . Obesity   . Asthma   . COPD (chronic obstructive pulmonary disease)   . Pneumonia   . CHF (congestive heart failure)    Past Surgical History  Procedure Date  . Tubal ligation   . Abdominal hysterectomy    Social History:  reports that she quit smoking about 11 years ago. Her smokeless tobacco use includes Snuff and Chew. She reports that she does not drink alcohol or use illicit drugs.  where does patient live--home,   Can patient participate in ADLs yes  Allergies  Allergen Reactions  . Sulfamethoxazole W-Trimethoprim Hives and Nausea And Vomiting  . Penicillins Hives    Family History  Problem Relation Age of Onset  . Heart disease Father   . Cancer Mother 70    Breast     Prior to Admission medications   Medication Sig Start Date End Date Taking? Authorizing Provider  acetaminophen (TYLENOL) 325 MG tablet Take 650 mg by mouth every 6 (six) hours as needed. For pain   Yes Historical Provider, MD  albuterol (PROVENTIL HFA;VENTOLIN HFA) 108 (90 BASE) MCG/ACT inhaler Inhale 1 puff  into the lungs every 6 (six) hours as needed. For wheezing   Yes Historical Provider, MD  amLODipine (NORVASC) 5 MG tablet Take 5 mg by mouth daily.   Yes Historical Provider, MD  aspirin EC 81 MG tablet Take 81 mg by mouth daily.    Yes Historical Provider,  MD  chlorproMAZINE (THORAZINE) 50 MG tablet Take 1-4 tablets (50-200 mg total) by mouth 3 (three) times daily. Take 1 tablet (50MG ) twice daily and 4 tablets (200MG ) at bedtime. 01/12/12  Yes Rhetta Mura, MD  divalproex (DEPAKOTE ER) 500 MG 24 hr tablet Take 2 tablets (1,000 mg total) by mouth 2 (two) times daily. 01/12/12  Yes Rhetta Mura, MD  fluticasone (FLONASE) 50 MCG/ACT nasal spray Place 2 sprays into the nose daily as needed. For nasal congestion   Yes Historical Provider, MD  folic acid (FOLVITE) 1 MG tablet Take 1 mg by mouth every morning.    Yes Historical Provider, MD  levothyroxine (SYNTHROID, LEVOTHROID) 150 MCG tablet Take 150 mcg by mouth daily.   Yes Historical Provider, MD  lisinopril (PRINIVIL,ZESTRIL) 40 MG tablet Take 40 mg by mouth every 12 (twelve) hours.    Yes Historical Provider, MD  metoprolol succinate (TOPROL-XL) 50 MG 24 hr tablet Take 50 mg by mouth daily. Take with or immediately following a meal.   Yes Historical Provider, MD  omeprazole (PRILOSEC) 20 MG capsule Take 20 mg by mouth daily.    Yes Historical Provider, MD  paliperidone ([name]) 6 MG 24 hr tablet Take 1 tablet (6 mg total) by mouth 2 (two) times daily. 01/12/12  Yes Rhetta Mura, MD   Physical Exam: Filed Vitals:   01/26/12 0205 01/26/12 0557 01/26/12 0559 01/26/12 0637  BP: 99/78 116/78 123/83 111/76  Pulse: 86 77 72 74  Temp:      TempSrc:      Resp:    18  SpO2:    96%    Constitutional: Vital signs reviewed.  Patient is a well-developed and well-nourished in no acute distress and cooperative with exam. Alert and oriented x3.  Head: Normocephalic and atraumatic Mouth: no erythema or exudates, MMM Eyes: PERRL, EOMI, conjunctivae normal, No scleral icterus.  Neck: Supple, Trachea midline normal ROM, No JVD, mass, thyromegaly, or carotid bruit present.  Cardiovascular: RRR, S1 normal, S2 normal, no MRG, pulses symmetric and intact bilaterally Pulmonary/Chest: CTAB, no  wheezes, rales, or rhonchi Abdominal: Soft. Non-tender, non-distended, bowel sounds are normal, no masses, organomegaly, or guarding present.  Musculoskeletal: No joint deformities, erythema, or stiffness, ROM full and no nontender,  Bilateral lower extremity chronic lymphadema. Neurological: A&O x3, Strength is normal and symmetric bilaterally, cranial nerve II-XII are grossly intact, no focal motor deficit, sensory intact to light touch bilaterally.  Skin: Warm, dry and intact. No rash, cyanosis, or clubbing.  Psychiatric: Normal mood and affect.  Labs on Admission:  Basic Metabolic Panel:  Lab 01/26/12 0981 01/19/12 1305  NA 136 136  K 4.1 3.9  CL 98 97  CO2 33* 32  GLUCOSE 113* 89  BUN 11 6  CREATININE 0.89 0.82  CALCIUM 9.5 9.4  MG -- --  PHOS -- --   Liver Function Tests: No results found for this basename: AST:5,ALT:5,ALKPHOS:5,BILITOT:5,PROT:5,ALBUMIN:5 in the last 168 hours No results found for this basename: LIPASE:5,AMYLASE:5 in the last 168 hours No results found for this basename: AMMONIA:5 in the last 168 hours CBC:  Lab 01/26/12 0044 01/19/12 1305  WBC 7.5 5.9  NEUTROABS 3.6 2.7  HGB 12.6  11.5*  HCT 38.3 35.0*  MCV 102.1* 101.2*  PLT 171 159   Cardiac Enzymes: No results found for this basename: CKTOTAL:5,CKMB:5,CKMBINDEX:5,TROPONINI:5 in the last 168 hours  BNP (last 3 results)  Basename 01/10/12 2142 12/16/11 1936 10/27/11 0027  PROBNP 89.4 83.7 115.7   CBG: No results found for this basename: GLUCAP:5 in the last 168 hours  Radiological Exams on Admission: Dg Chest 2 View  01/26/2012  *RADIOLOGY REPORT*  Clinical Data: Cough.  Shortness of breath.  Chest pain.  CHEST - 2 VIEW  Comparison: 01/19/2012  Findings: Shallow inspiration. The heart size and pulmonary vascularity are normal. The lungs appear clear and expanded without focal air space disease or consolidation. No blunting of the costophrenic angles.  No pneumothorax.  Mediastinal contours  appear intact.  Degenerative changes in the spine.  No significant change since previous study.  IMPRESSION: No evidence of active pulmonary disease.  Original Report Authenticated By: Marlon Pel, M.D.    EKG: sinus rhythm  Assessment/Plan Active Problems:  HYPOTHYROIDISM NOS  OBSTRUCTIVE SLEEP APNEA  HYPERTENSION, BENIGN ESSENTIAL  ASTHMA  CHEST PAIN  Schizoaffective disorder, bipolar type  Nausea  Diarrhea   1. Atypical chest pain: she had recent NM stress test negative for inducible ischemia. But will admit to rule out ACS.  - will get serial cardiac enzymes, repeat EKG in am, keep her on telemetry for evaluation of cardiac arrythmias  For syncope.  - her LV EF is 47% last month. She does not require echo on this admission.   2. Hypertension; controlled.   3. Syncope: admit to tele and evaluate for arrythmia's.   4. Schizoaffective disorder: resume home medications.   5. Hypothyroidism: continue with synthroid.  6. Nausea/ diarrhea: probably  Viral gastroenteritis vs c diff diarrhea, in view of her recent hospitalization and antibiotic use. No abdominal pain. Will get c diff PCR and stool culture. She is afebrile and leukocytosis.   7. DVT Prophylaxis; lovenox.   Code Status: full code Family Communication: none at bedside Disposition Plan: pending PT/OT EVAL.  Time spent: 52 minutes  Yasmina Chico [facility] Pager [phone]  If 7PM-7AM, please contact night-coverage [email or web address] Password [email or web address] 01/26/2012, 11:13 AM

## 2012-01-27 DIAGNOSIS — F259 Schizoaffective disorder, unspecified: Secondary | ICD-10-CM

## 2012-01-27 DIAGNOSIS — D649 Anemia, unspecified: Secondary | ICD-10-CM

## 2012-01-27 LAB — COMPREHENSIVE METABOLIC PANEL
ALT: 8 U/L (ref 0–35)
Alkaline Phosphatase: 39 U/L (ref 39–117)
BUN: 10 mg/dL (ref 6–23)
CO2: 33 mEq/L — ABNORMAL HIGH (ref 19–32)
Chloride: 104 mEq/L (ref 96–112)
GFR calc Af Amer: 74 mL/min — ABNORMAL LOW (ref >90)
GFR calc non Af Amer: 64 mL/min — ABNORMAL LOW (ref >90)
Glucose, Bld: 101 mg/dL — ABNORMAL HIGH (ref 70–99)
Potassium: 4.2 mEq/L (ref 3.5–5.1)
Sodium: 142 mEq/L (ref 135–145)
Total Bilirubin: 0.2 mg/dL — ABNORMAL LOW (ref 0.3–1.2)
Total Protein: 5.5 g/dL — ABNORMAL LOW (ref 6.0–8.3)

## 2012-01-27 LAB — CARDIAC PANEL(CRET KIN+CKTOT+MB+TROPI)
CK, MB: 1.7 ng/mL (ref 0.3–4.0)
Relative Index: INVALID (ref 0.0–2.5)
Total CK: 15 U/L (ref 7–177)
Troponin I: <0.3 ng/mL (ref <0.30)

## 2012-01-27 LAB — CBC
MCHC: 32.2 g/dL (ref 30.0–36.0)
Platelets: 141 10*3/uL — ABNORMAL LOW (ref 150–400)
RDW: 14.8 % (ref 11.5–15.5)
WBC: 5.9 10*3/uL (ref 4.0–10.5)

## 2012-01-27 LAB — GLUCOSE, CAPILLARY: Glucose-Capillary: 83 mg/dL (ref 70–99)

## 2012-01-27 MED ORDER — LISINOPRIL 20 MG PO TABS
20.0000 mg | ORAL_TABLET | Freq: Two times a day (BID) | ORAL | Status: DC
  Filled 2012-01-27: qty 1

## 2012-01-27 MED ORDER — LISINOPRIL 20 MG PO TABS: 20.0000 mg | ORAL_TABLET | Freq: Two times a day (BID) | ORAL | Status: DC

## 2012-01-27 MED ORDER — TRAMADOL HCL 50 MG PO TABS: 50.0000 mg | ORAL_TABLET | ORAL | Status: DC

## 2012-01-27 NOTE — Progress Notes (Signed)
All D/C papers were given pt with RX and fu care Her sufficient  Other with instructions of care  IV D/Ced

## 2012-01-27 NOTE — Progress Notes (Addendum)
[facility] PROGRESS NOTE  CARESS REFFITT JXB:[id number] DOB: [birth year] DOA: 01/26/2012 PCP: Julieanne Manson, MD  Assessment/Plan: Active Problems:  HYPOTHYROIDISM NOS  OBSTRUCTIVE SLEEP APNEA  HYPERTENSION, BENIGN ESSENTIAL  ASTHMA  CHEST PAIN  Schizoaffective disorder, bipolar type  Nausea  Diarrhea  Spoke at length with fiancee- he did not witness any syncopal events, she only complained of chest pain [facility] has not [facility] for 3 months-- will need a new PCP Also needs close psych follow up and would benefit from home health Will change BP meds as on the low side OSA testing as an outpatient  Code Status: full Family Communication: fiancee at home Disposition Plan: home soon    HPI/Subjective: Says she passed out in the hospital last PM-- tele reviewed, no arrythmias  Objective: Filed Vitals:   01/26/12 1400 01/26/12 2145 01/26/12 2355 01/27/12 0619  BP: 120/78 115/74 110/70 94/61  Pulse:  82 114 67  Temp: 98 F (36.7 C) 98.6 F (37 C) 98.3 F (36.8 C) 97.7 F (36.5 C)  TempSrc: Oral Oral Oral Axillary  Resp: 17 20 20 20   SpO2: 98% 92% 91% 90%   No intake or output data in the 24 hours ending 01/27/12 1121  Exam:   General:  Awake, cooperative  Cardiovascular: rrr  Respiratory: clear  Abdomen: +BS, soft, NT   Data Reviewed: Basic Metabolic Panel:  Lab 01/27/12 1308 01/26/12 1130 01/26/12 0044  NA 142 -- 136  K 4.2 -- 4.1  CL 104 -- 98  CO2 33* -- 33*  GLUCOSE 101* -- 113*  BUN 10 -- 11  CREATININE 0.96 0.85 0.89  CALCIUM 9.0 -- 9.5  MG -- -- --  PHOS -- -- --   Liver Function Tests:  Lab 01/27/12 0330  AST 15  ALT 8  ALKPHOS 39  BILITOT 0.2*  PROT 5.5*  ALBUMIN 2.6*   No results found for this basename: LIPASE:5,AMYLASE:5 in the last 168 hours No results found for this basename: AMMONIA:5 in the last 168 hours CBC:  Lab 01/27/12 0330 01/26/12 1130 01/26/12 0044  WBC 5.9 6.2 7.5    NEUTROABS -- -- 3.6  HGB 11.4* 12.7 12.6  HCT 35.4* 39.3 38.3  MCV 102.6* 102.3* 102.1*  PLT 141* 169 171   Cardiac Enzymes:  Lab 01/27/12 0327 01/26/12 1931 01/26/12 1130  CKTOTAL 15 17 22   CKMB 1.7 1.6 1.5  CKMBINDEX -- -- --  TROPONINI <0.30 <0.30 <0.30   BNP (last 3 results)  Basename 01/10/12 2142 12/16/11 1936 10/27/11 0027  PROBNP 89.4 83.7 115.7   CBG:  Lab 01/27/12 0752 01/26/12 2010 01/26/12 1642 01/26/12 1134  GLUCAP 83 106* 123* 103*    No results found for this or any previous visit (from the past 240 hour(s)).   Studies: Dg Chest 1 View  01/19/2012  *RADIOLOGY REPORT*  Clinical Data: Fever, shortness of breath  CHEST - 1 VIEW  Comparison: 01/12/2012  Findings: Cardiomegaly again noted.  The exam is limited by patient's large body habitus.  Mild elevation of the right hemidiaphragm.  No acute infiltrate or pulmonary edema.  IMPRESSION: Limited study by patient's large body habitus.  Cardiomegaly again noted.  No active disease.  Original Report Authenticated By: Natasha Mead, M.D.   Dg Chest 2 View  01/26/2012  *RADIOLOGY REPORT*  Clinical Data: Cough.  Shortness of breath.  Chest pain.  CHEST - 2 VIEW  Comparison: 01/19/2012  Findings: Shallow inspiration. The heart size and pulmonary vascularity  are normal. The lungs appear clear and expanded without focal air space disease or consolidation. No blunting of the costophrenic angles.  No pneumothorax.  Mediastinal contours appear intact.  Degenerative changes in the spine.  No significant change since previous study.  IMPRESSION: No evidence of active pulmonary disease.  Original Report Authenticated By: Marlon Pel, M.D.   Dg Chest 2 View  01/12/2012  *RADIOLOGY REPORT*  Clinical Data: Shortness of breath, wheezing, cough, congestion, possible pneumonia  CHEST - 2 VIEW  Comparison: 01/10/2012  Findings: Enlargement of cardiac silhouette. Minimally tortuous aorta. Pulmonary vascularity normal. Lungs clear. No  pleural effusion or pneumothorax. Underpenetration on lateral view. No acute osseous findings.  IMPRESSION: No acute abnormalities. Enlargement of cardiac silhouette.  Original Report Authenticated By: Lollie Marrow, M.D.   Dg Chest 2 View  01/10/2012  *RADIOLOGY REPORT*  Clinical Data: Shortness of breath.  Hypertension.  CHEST - 2 VIEW  Comparison: 01/04/2012  Findings: Borderline cardiomegaly noted with airway thickening and faint interstitial accentuation.  There is tortuosity of the thoracic aorta.  Thoracic spondylosis noted.  No pleural effusion is observed.  Technical factors related to patient body habitus reduce diagnostic sensitivity and specificity.  IMPRESSION:  1.  Mild airway thickening and faint interstitial accentuation with borderline cardiomegaly. Differential diagnostic considerations include mild bronchitis/atypical pneumonia (favored) over minimal interstitial pulmonary edema. 2.  Tortuous aorta. 3.  Thoracic spondylosis.  Original Report Authenticated By: Dellia Cloud, M.D.   Dg Chest 2 View  01/04/2012  *RADIOLOGY REPORT*  Clinical Data: Pain and shortness of breath  CHEST - 2 VIEW  Comparison: 01/02/2012  Findings: The heart and pulmonary vascularity are within normal limits.  The lungs are clear bilaterally.  No bony abnormality is seen.  IMPRESSION: No acute intrathoracic abnormality.  Original Report Authenticated By: Phillips Odor, M.D.   Dg Chest 2 View  01/02/2012  *RADIOLOGY REPORT*  Clinical Data: Left side chest pain, heart attack symptoms, history asthma, COPD, CHF, diabetes, hypertension  CHEST - 2 VIEW  Comparison: 12/26/2011  Findings: Enlargement of cardiac silhouette with pulmonary vascular congestion. Tortuous aorta with atherosclerotic calcification at arch. Decreased lung volumes with crowding of perihilar markings. No definite acute infiltrate, pleural effusion or pneumothorax. Minimal right basilar atelectasis. No acute osseous findings.  IMPRESSION:  Enlargement of cardiac silhouette with pulmonary vascular congestion. Low lung volumes with minimal right basilar atelectasis.  Original Report Authenticated By: Lollie Marrow, M.D.   Ct Angio Chest W/cm &/or Wo Cm  01/02/2012  *RADIOLOGY REPORT*  Clinical Data: Chest pain and shortness of breath.  CT ANGIOGRAPHY CHEST  Technique:  Multidetector CT imaging of the chest using the standard protocol during bolus administration of intravenous contrast. Multiplanar reconstructed images including MIPs were obtained and reviewed to evaluate the vascular anatomy.  Contrast: OMNIPAQUE IOHEXOL 350 MG/ML SOLN  Comparison: Chest CT 04/16/2008.  Findings:  Mediastinum: Study is limited by a large amount of patient motion, image noise from the patient's large body habitus, and suboptimal contrast bolus.  Within these limitations, there is no evidence of central, lobar or proximal segmental sized pulmonary embolism. Unfortunately, more distal segmental and subsegmental sized embolism cannot be completely excluded on this examination. Heart size is mildly enlarged. There is no significant pericardial fluid, thickening or pericardial calcification. No pathologically enlarged mediastinal or hilar lymph nodes. Esophagus is unremarkable in appearance.  Lungs/Pleura: No consolidative airspace disease.  No pleural effusions.  The no definite suspicious appearing pulmonary nodules or masses are identified.  Upper Abdomen: Unremarkable.  Musculoskeletal: There are no aggressive appearing lytic or blastic lesions noted in the visualized portions of the skeleton.  IMPRESSION: 1.  Limited examination demonstrating no evidence of central, lobar or proximal segmental sized pulmonary embolism. 2.  Mild cardiomegaly.  Original Report Authenticated By: Florencia Reasons, M.D.   Nm Myocar Multi W/spect W/wall Motion / Ef  12/28/2011  Clinical Data:  Chest pain.  Technique:  Standard myocardial SPECT imaging performed after resting  intravenous injection of Tc-89m tetrofosmin.  Subsequently, stress in the form of Lexiscan and was administered under the supervision of the Cardiology staff.  At peak stress, Tc-92m tetrofosmin was injected intravenously and standard myocardial SPECT imaging performed.  Quantitative gated imaging also performed to evaluate left ventricular wall motion and estimate left ventricular ejection fraction.  Radiopharmaceutical: Tc-24m tetrofosmin, 30 mCi at rest and 30 mCi at stress.  Comparison:  None  MYOCARDIAL IMAGING WITH SPECT (REST AND STRESS)  Findings:  Extensive motion artifact causes an elongated and "smeared" appearance of the left ventricle on the stress images. Accounting for this, no inducible ischemia is observed.  LEFT VENTRICULAR EJECTION FRACTION  Findings: I reprocessed the images on today's exam to try to minimize the fact of the motion artifact/snaring.  Left ventricular end-diastolic volume is 113 cc.  End-systolic volume is 59 cc. Derived LV ejection fraction is 47%.  GATED LEFT VENTRICULAR WALL MOTION STUDY  Findings:  Left ventricular wall motion wall thickening are within normal limits.  IMPRESSION:  1.  Reduced sensitivity and specificity due to motion artifact.  No observed inducible ischemia.  Increased emphasis on the ECG portion of the exam is recommended due to the motion artifact.  Original Report Authenticated By: Dellia Cloud, M.D.   Dg Shoulder Left  01/11/2012  *RADIOLOGY REPORT*  Clinical Data: Left shoulder pain.  No injury.  LEFT SHOULDER - 2+ VIEW  Comparison: 11/07/2011  Findings: Limited range of motion is noted comparing the internal and external views.  No obvious fracture or dislocation.  Chronic changes at the glenohumeral joint are noted.  IMPRESSION: No acute bony pathology.  Degenerative change.  Original Report Authenticated By: Donavan Burnet, M.D.    Scheduled Meds:   . amLODipine  5 mg Oral Daily  . aspirin EC  81 mg Oral Daily  . chlorproMAZINE  200  mg Oral QHS  . chlorproMAZINE  50 mg Oral BID WC  . divalproex  1,000 mg Oral BID  . enoxaparin (LOVENOX) injection  40 mg Subcutaneous Q24H  . fluticasone  2 spray Each Nare Daily  . folic acid  1 mg Oral q morning - 10a  . levothyroxine  150 mcg Oral Daily  . lisinopril  40 mg Oral Q12H  . metoprolol succinate  50 mg Oral Daily  . paliperidone  6 mg Oral BID  . pantoprazole  40 mg Oral Q1200  . sodium chloride  3 mL Intravenous Q12H  . traMADol  50 mg Oral NOW   Continuous Infusions:   Active Problems:  HYPOTHYROIDISM NOS  OBSTRUCTIVE SLEEP APNEA  HYPERTENSION, BENIGN ESSENTIAL  ASTHMA  CHEST PAIN  Schizoaffective disorder, bipolar type  Nausea  Diarrhea    Time spent: 9    Marlin Canary  [facility] Pager [phone] 01/27/2012, 11:21 AM  LOS: 1 day

## 2012-01-27 NOTE — Discharge Summary (Signed)
Physician Discharge Summary  [name] GNF:[id number] DOB: [birth year] DOA: 01/26/2012  PCP: Julieanne Manson, MD  Admit date: 01/26/2012 Discharge date: 01/27/2012  Recommendations for Outpatient Follow-up:  1. Continue with ACT team 2. Home health- RN for help with medications 3. OSA work up as outpatient    Discharge Diagnoses:  Active Problems:  HYPOTHYROIDISM NOS  OBSTRUCTIVE SLEEP APNEA  HYPERTENSION, BENIGN ESSENTIAL  ASTHMA  CHEST PAIN  Schizoaffective disorder, bipolar type  Nausea  Diarrhea   Discharge Condition: improved  Diet recommendation: cardiac  Wt Readings from Last 3 Encounters:  01/19/12 163.295 kg (360 lb)  01/12/12 150.9 kg (332 lb 10.8 oz)  12/28/11 156.6 kg (345 lb 3.9 oz)    History of present illness:  61 year old lady with h/o Hypertension, Schizoaffective disorder, Obesity, Atrial fibrillation, Diabetes, recently discharged from the hospital after being treated for HCAP. Comes in for chest pain this morning, associated with nausea. Chest pain is precordial, non radiating, not associated with diaphoresis or activity, is intermittent. She reports nausea, no vomiting, and loose stools for 2 weeks now. She denies any fever or chills. She reports passing out in the last few weeks, denies hitting her head. No witnesses to any of the falls. No dizziness or palpitations. She reports has a h/o of CHF, atrial fibrillation , but her anticoagulants were stopped. On arrival to ED, she had a CXR and UA which did not show pneumonia or UTI. Her EKG is sinus rhythm, without ST T wave changes. She had a NM stress test last month which was negative for inducible ischemia. Her LVEF was 47%. She is being admitted for evaluation of syncope and atypical chest pain.   Hospital Course Spoke at length with fiancee- he did not witness any syncopal events, she only complained of chest pain  [facility]-- has not [facility] for 3 months-- will need  a new PCP - arrange during hospital stay- Sept 4th with [facility] negative Also needs close psych follow up and  home health arranged Will change BP meds as on the low side and could be causing her to feel dizzy  Discharge Exam: Filed Vitals:   01/27/12 1128  BP: 111/74  Pulse: 76  Temp:   Resp:    Filed Vitals:   01/27/12 0619 01/27/12 1120 01/27/12 1123 01/27/12 1128  BP: 94/61 95/67 103/73 111/74  Pulse: 67 71 73 76  Temp: 97.7 F (36.5 C)     TempSrc: Axillary     Resp: 20     SpO2: 90%       General: pleasant and cooperative, NAD Cardiovascular: rrr Respiratory: clear anteriorly Skin: no rashes or lesions  Discharge Instructions  Discharge Orders    Future Orders Please Complete By Expires   Diet - low sodium heart healthy      Increase activity slowly      Discharge instructions      Comments:   Home health RN Keep appointment with new PCP     Medication List  As of 01/27/2012 12:19 PM   STOP taking these medications         amLODipine 5 MG tablet         TAKE these medications         acetaminophen 325 MG tablet   Commonly known as: TYLENOL   Take 650 mg by mouth every 6 (six) hours as needed. For pain      albuterol 108 (90 BASE) MCG/ACT  inhaler   Commonly known as: PROVENTIL HFA;VENTOLIN HFA   Inhale 1 puff into the lungs every 6 (six) hours as needed. For wheezing      aspirin EC 81 MG tablet   Take 81 mg by mouth daily.      chlorproMAZINE 50 MG tablet   Commonly known as: THORAZINE   Take 1-4 tablets (50-200 mg total) by mouth 3 (three) times daily. Take 1 tablet (50MG ) twice daily and 4 tablets (200MG ) at bedtime.      divalproex 500 MG 24 hr tablet   Commonly known as: DEPAKOTE ER   Take 2 tablets (1,000 mg total) by mouth 2 (two) times daily.      fluticasone 50 MCG/ACT nasal spray   Commonly known as: FLONASE   Place 2 sprays into the nose daily as needed. For nasal congestion      folic acid 1 MG tablet   Commonly known  as: FOLVITE   Take 1 mg by mouth every morning.      levothyroxine 150 MCG tablet   Commonly known as: SYNTHROID, LEVOTHROID   Take 150 mcg by mouth daily.      lisinopril 20 MG tablet   Commonly known as: PRINIVIL,ZESTRIL   Take 1 tablet (20 mg total) by mouth every 12 (twelve) hours.      metoprolol succinate 50 MG 24 hr tablet   Commonly known as: TOPROL-XL   Take 50 mg by mouth daily. Take with or immediately following a meal.      omeprazole 20 MG capsule   Commonly known as: PRILOSEC   Take 20 mg by mouth daily.      paliperidone 6 MG 24 hr tablet   Commonly known as: [name]   Take 1 tablet (6 mg total) by mouth 2 (two) times daily.           Follow-up Information    Follow up with [name], MD on 02/22/2012. (new patient appointment at 9:15)    Contact information:   [address] [place] [address] [phone]           The results of significant diagnostics from this hospitalization (including imaging, microbiology, ancillary and laboratory) are listed below for reference.    Significant Diagnostic Studies: Dg Chest 1 View  01/19/2012  *RADIOLOGY REPORT*  Clinical Data: Fever, shortness of breath  CHEST - 1 VIEW  Comparison: 01/12/2012  Findings: Cardiomegaly again noted.  The exam is limited by patient's large body habitus.  Mild elevation of the right hemidiaphragm.  No acute infiltrate or pulmonary edema.  IMPRESSION: Limited study by patient's large body habitus.  Cardiomegaly again noted.  No active disease.  Original Report Authenticated By: Natasha Mead, M.D.   Dg Chest 2 View  01/26/2012  *RADIOLOGY REPORT*  Clinical Data: Cough.  Shortness of breath.  Chest pain.  CHEST - 2 VIEW  Comparison: 01/19/2012  Findings: Shallow inspiration. The heart size and pulmonary vascularity are normal. The lungs appear clear and expanded without focal air space disease or consolidation. No blunting of the costophrenic angles.  No pneumothorax.   Mediastinal contours appear intact.  Degenerative changes in the spine.  No significant change since previous study.  IMPRESSION: No evidence of active pulmonary disease.  Original Report Authenticated By: Marlon Pel, M.D.   Dg Chest 2 View  01/12/2012  *RADIOLOGY REPORT*  Clinical Data: Shortness of breath, wheezing, cough, congestion, possible pneumonia  CHEST - 2 VIEW  Comparison: 01/10/2012  Findings:  Enlargement of cardiac silhouette. Minimally tortuous aorta. Pulmonary vascularity normal. Lungs clear. No pleural effusion or pneumothorax. Underpenetration on lateral view. No acute osseous findings.  IMPRESSION: No acute abnormalities. Enlargement of cardiac silhouette.  Original Report Authenticated By: Lollie Marrow, M.D.   Dg Chest 2 View  01/10/2012  *RADIOLOGY REPORT*  Clinical Data: Shortness of breath.  Hypertension.  CHEST - 2 VIEW  Comparison: 01/04/2012  Findings: Borderline cardiomegaly noted with airway thickening and faint interstitial accentuation.  There is tortuosity of the thoracic aorta.  Thoracic spondylosis noted.  No pleural effusion is observed.  Technical factors related to patient body habitus reduce diagnostic sensitivity and specificity.  IMPRESSION:  1.  Mild airway thickening and faint interstitial accentuation with borderline cardiomegaly. Differential diagnostic considerations include mild bronchitis/atypical pneumonia (favored) over minimal interstitial pulmonary edema. 2.  Tortuous aorta. 3.  Thoracic spondylosis.  Original Report Authenticated By: Dellia Cloud, M.D.   Dg Chest 2 View  01/04/2012  *RADIOLOGY REPORT*  Clinical Data: Pain and shortness of breath  CHEST - 2 VIEW  Comparison: 01/02/2012  Findings: The heart and pulmonary vascularity are within normal limits.  The lungs are clear bilaterally.  No bony abnormality is seen.  IMPRESSION: No acute intrathoracic abnormality.  Original Report Authenticated By: Phillips Odor, M.D.   Dg Chest 2  View  01/02/2012  *RADIOLOGY REPORT*  Clinical Data: Left side chest pain, heart attack symptoms, history asthma, COPD, CHF, diabetes, hypertension  CHEST - 2 VIEW  Comparison: 12/26/2011  Findings: Enlargement of cardiac silhouette with pulmonary vascular congestion. Tortuous aorta with atherosclerotic calcification at arch. Decreased lung volumes with crowding of perihilar markings. No definite acute infiltrate, pleural effusion or pneumothorax. Minimal right basilar atelectasis. No acute osseous findings.  IMPRESSION: Enlargement of cardiac silhouette with pulmonary vascular congestion. Low lung volumes with minimal right basilar atelectasis.  Original Report Authenticated By: Lollie Marrow, M.D.   Ct Angio Chest W/cm &/or Wo Cm  01/02/2012  *RADIOLOGY REPORT*  Clinical Data: Chest pain and shortness of breath.  CT ANGIOGRAPHY CHEST  Technique:  Multidetector CT imaging of the chest using the standard protocol during bolus administration of intravenous contrast. Multiplanar reconstructed images including MIPs were obtained and reviewed to evaluate the vascular anatomy.  Contrast: OMNIPAQUE IOHEXOL 350 MG/ML SOLN  Comparison: Chest CT 04/16/2008.  Findings:  Mediastinum: Study is limited by a large amount of patient motion, image noise from the patient's large body habitus, and suboptimal contrast bolus.  Within these limitations, there is no evidence of central, lobar or proximal segmental sized pulmonary embolism. Unfortunately, more distal segmental and subsegmental sized embolism cannot be completely excluded on this examination. Heart size is mildly enlarged. There is no significant pericardial fluid, thickening or pericardial calcification. No pathologically enlarged mediastinal or hilar lymph nodes. Esophagus is unremarkable in appearance.  Lungs/Pleura: No consolidative airspace disease.  No pleural effusions.  The no definite suspicious appearing pulmonary nodules or masses are identified.   Upper Abdomen: Unremarkable.  Musculoskeletal: There are no aggressive appearing lytic or blastic lesions noted in the visualized portions of the skeleton.  IMPRESSION: 1.  Limited examination demonstrating no evidence of central, lobar or proximal segmental sized pulmonary embolism. 2.  Mild cardiomegaly.  Original Report Authenticated By: Florencia Reasons, M.D.   Nm Myocar Multi W/spect W/wall Motion / Ef  12/28/2011  Clinical Data:  Chest pain.  Technique:  Standard myocardial SPECT imaging performed after resting intravenous injection of Tc-35m tetrofosmin.  Subsequently, stress in  the form of Lexiscan and was administered under the supervision of the Cardiology staff.  At peak stress, Tc-26m tetrofosmin was injected intravenously and standard myocardial SPECT imaging performed.  Quantitative gated imaging also performed to evaluate left ventricular wall motion and estimate left ventricular ejection fraction.  Radiopharmaceutical: Tc-63m tetrofosmin, 30 mCi at rest and 30 mCi at stress.  Comparison:  None  MYOCARDIAL IMAGING WITH SPECT (REST AND STRESS)  Findings:  Extensive motion artifact causes an elongated and "smeared" appearance of the left ventricle on the stress images. Accounting for this, no inducible ischemia is observed.  LEFT VENTRICULAR EJECTION FRACTION  Findings: I reprocessed the images on today's exam to try to minimize the fact of the motion artifact/snaring.  Left ventricular end-diastolic volume is 113 cc.  End-systolic volume is 59 cc. Derived LV ejection fraction is 47%.  GATED LEFT VENTRICULAR WALL MOTION STUDY  Findings:  Left ventricular wall motion wall thickening are within normal limits.  IMPRESSION:  1.  Reduced sensitivity and specificity due to motion artifact.  No observed inducible ischemia.  Increased emphasis on the ECG portion of the exam is recommended due to the motion artifact.  Original Report Authenticated By: Dellia Cloud, M.D.   Dg Shoulder  Left  01/11/2012  *RADIOLOGY REPORT*  Clinical Data: Left shoulder pain.  No injury.  LEFT SHOULDER - 2+ VIEW  Comparison: 11/07/2011  Findings: Limited range of motion is noted comparing the internal and external views.  No obvious fracture or dislocation.  Chronic changes at the glenohumeral joint are noted.  IMPRESSION: No acute bony pathology.  Degenerative change.  Original Report Authenticated By: Donavan Burnet, M.D.    Microbiology: No results found for this or any previous visit (from the past 240 hour(s)).   Labs: Basic Metabolic Panel:  Lab 01/27/12 1610 01/26/12 1130 01/26/12 0044  NA 142 -- 136  K 4.2 -- 4.1  CL 104 -- 98  CO2 33* -- 33*  GLUCOSE 101* -- 113*  BUN 10 -- 11  CREATININE 0.96 0.85 0.89  CALCIUM 9.0 -- 9.5  MG -- -- --  PHOS -- -- --   Liver Function Tests:  Lab 01/27/12 0330  AST 15  ALT 8  ALKPHOS 39  BILITOT 0.2*  PROT 5.5*  ALBUMIN 2.6*   No results found for this basename: LIPASE:5,AMYLASE:5 in the last 168 hours No results found for this basename: AMMONIA:5 in the last 168 hours CBC:  Lab 01/27/12 0330 01/26/12 1130 01/26/12 0044  WBC 5.9 6.2 7.5  NEUTROABS -- -- 3.6  HGB 11.4* 12.7 12.6  HCT 35.4* 39.3 38.3  MCV 102.6* 102.3* 102.1*  PLT 141* 169 171   Cardiac Enzymes:  Lab 01/27/12 0327 01/26/12 1931 01/26/12 1130  CKTOTAL 15 17 22   CKMB 1.7 1.6 1.5  CKMBINDEX -- -- --  TROPONINI <0.30 <0.30 <0.30   BNP: BNP (last 3 results)  Basename 01/10/12 2142 12/16/11 1936 10/27/11 0027  PROBNP 89.4 83.7 115.7   CBG:  Lab 01/27/12 1129 01/27/12 0752 01/26/12 2010 01/26/12 1642 01/26/12 1134  GLUCAP 96 83 106* 123* 103*    Time coordinating discharge: 35 minutes  Signed:  Benjamine Mola, Tipton Ballow  [facility] 01/27/2012, 12:19 PM

## 2012-01-27 NOTE — Evaluation (Signed)
Physical Therapy Evaluation Patient Details Name: [name] MRN: [id number] DOB: January 03, 1951 Today's Date: 01/27/2012 Time: 4782-9562 PT Time Calculation (min): 38 min  PT Assessment / Plan / Recommendation Clinical Impression  Pt adm with ?syncope (pt's fiancee denies and reports pt confabulates--per MD). Orthostatic VS were negative, however pt reporting she feels faint with standing each time. At one point, she bowed her head, closed her eyes, and sighed. When told she needed to continue to the bed, she looked up and continued on. Then told the MD she fainted while she was with therapy. She reports she has no DME at home and wants RW, BSC, shower seat, and hospital bed. Recommend HHPT for safety eval and can discuss with fiancee if equipment would be helpful.     PT Assessment  Patient needs continued PT services    Follow Up Recommendations  Home health PT;Supervision for mobility/OOB    Barriers to Discharge None      Equipment Recommendations  Defer to next venue    Recommendations for Other Services     Frequency Min 3X/week    Precautions / Restrictions Precautions Precautions: Fall Precaution Comments: ? fall risk due to unpredictable behavior   Pertinent Vitals/Pain BP supine 95/67  HR 71 BP sitting 103/73 HR 73 BP sitting 111/74 HR 76   *pt could not stand long enough for BP, registered ~ 30 seconds after she sat down      Mobility  Bed Mobility Bed Mobility: Sit to Supine;Scooting to HOB Sit to Supine: 4: Min guard;HOB flat;With rail Scooting to [facility]: 6: Modified independent (Device/Increase time);With rail Details for Bed Mobility Assistance: Pt prefers to lie back diagonally on bed (vs attempting to educate her on sit to side), then wants her feet lifted onto bed--yet was able to do this herself with encouragement, then uses rails to straighten herself in the bed Transfers Transfers: Sit to Stand;Stand to Sit;Stand Pivot Transfers Sit to Stand: 5:  Supervision;With upper extremity assist;With armrests;From chair/3-in-1 Stand to Sit: 5: Supervision;With upper extremity assist;With armrests;To bed;To chair/3-in-1 Stand Pivot Transfers: 5: Supervision Details for Transfer Assistance: x 3; pt required cues for safe hand placement with use of RW Ambulation/Gait Ambulation/Gait Assistance: 5: Supervision Ambulation Distance (Feet): 5 Feet Assistive device: Rolling walker Ambulation/Gait Assistance Details: Pt reported feeling faint in standing, therefore distance limited to [facility] and to bed Gait Pattern: Step-to pattern;Decreased stride length    Exercises     PT Diagnosis: Generalized weakness  PT Problem List: Decreased strength;Decreased activity tolerance;Decreased mobility;Decreased cognition;Decreased knowledge of use of DME PT Treatment Interventions: DME instruction;Gait training;Functional mobility training;Therapeutic activities;Therapeutic exercise;Patient/family education   PT Goals Acute Rehab PT Goals PT Goal Formulation: With patient Time For Goal Achievement: 02/03/12 Potential to Achieve Goals: Good Pt will go Supine/Side to Sit: with modified independence;with HOB 0 degrees (no rail; incr time) PT Goal: Supine/Side to Sit - Progress: Goal set today Pt will go Sit to Supine/Side: with modified independence;with HOB 0 degrees PT Goal: Sit to Supine/Side - Progress: Goal set today Pt will go Sit to Stand: with modified independence;with upper extremity assist PT Goal: Sit to Stand - Progress: Goal set today Pt will go Stand to Sit: with modified independence;with upper extremity assist PT Goal: Stand to Sit - Progress: Goal set today Pt will Ambulate: 16 - 50 feet;with supervision;with least restrictive assistive device PT Goal: Ambulate - Progress: Goal set today  Visit Information  Last PT Received On: 01/27/12 Assistance Needed: +1  Subjective Data  Subjective: Pt reports she fell twice due to "passed out"; per  MD, fiancee denies this and reports pt confabulates Patient Stated Goal: Wants a hospital bed   Prior Functioning  Home Living Lives With: Significant other Available Help at Discharge: Family;Available 24 hours/day (fiancee) Bathroom Shower/Tub: Other (comment) (pt's fiancee helps her bathe at sink) Home Adaptive Equipment: None (per pt) Additional Comments: pt reports her fiancee helps her with everything Prior Function Level of Independence: Needs assistance Communication Communication: Expressive difficulties;Other (comment) (mumbles unintelligibly at times)    Cognition  Overall Cognitive Status: No family/caregiver present to determine baseline cognitive functioning Arousal/Alertness: Awake/alert Orientation Level: Oriented X4 / Intact Behavior During Session: Flat affect    Extremity/Trunk Assessment Right Upper Extremity Assessment RUE ROM/Strength/Tone: WFL for tasks assessed Left Upper Extremity Assessment LUE ROM/Strength/Tone: WFL for tasks assessed Right Lower Extremity Assessment RLE ROM/Strength/Tone: Deficits RLE ROM/Strength/Tone Deficits: unable to stand without rocking/momentum and use of UEs; difficulty lifting legs onto bed (although did with max encouragement) Left Lower Extremity Assessment LLE ROM/Strength/Tone: Deficits LLE ROM/Strength/Tone Deficits: unable to stand without rocking/momentum and use of UEs; difficulty lifting legs onto bed (although did with max encouragement)   Balance    End of Session PT - End of Session Activity Tolerance: Patient tolerated treatment well Patient left: in bed;with call bell/phone within reach Nurse Communication: Mobility status (discussed with nurse tech; position on BSC-trashcan for feet)  GP     [name] 01/27/2012, 12:29 PM  Pager [phone]

## 2012-01-27 NOTE — Care Management Note (Signed)
    Page 1 of 2   01/27/2012     12:06:31 PM   CARE MANAGEMENT NOTE 01/27/2012  Patient:  [name], [name]   Account Number:  [id number]  Date Initiated:  01/27/2012  Documentation initiated by:  Donn Pierini  Subjective/Objective Assessment:   Pt admitted with chest pain     Action/Plan:   PTA pt lived at home with fiance   Anticipated DC Date:  01/27/2012   Anticipated DC Plan:  HOME W HOME HEALTH SERVICES      DC Planning Services  CM consult      [facility] Choice  HOME HEALTH   Choice offered to / List presented to:  C-1 Patient        HH arranged  HH-1 RN  HH-2 PT      [facility] agency  [facility]   Status of service:  Completed, signed off Medicare Important Message given?   (If response is "NO", the following Medicare IM given date fields will be blank) Date Medicare IM given:   Date Additional Medicare IM given:    Discharge Disposition:  HOME W HOME HEALTH SERVICES  Per UR Regulation:  Reviewed for med. necessity/level of care/duration of stay  If discussed at Long Length of Stay Meetings, dates discussed:    Comments:  01/27/12- 1000- Donn Pierini RN, BSN [phone] Pt for possible d/c today, spoke with pt at bedside regarding d/c needs. Pt was a pt at [facility]- which is closing- needs a new PCP- options reviewed- call made to [facility]- which is accepting new medicare pt- appointment made for pt for Sept. 4 at 9:15 to establish as a new pt there. Also spoke with [place] CSW- who confirmed that pt is followed by outpt pysch. Dr. Marney Setting- ACT team see pt at home every Tues. out of [place]. Per pt she has had [facility] for [facility] services in past and would like to use them again for any [facility] services. Order for HH-RN/PT- referral sent to [facility] via [facility] and call made to [facility] with [facility] (pt was not currently active with [facility]). services will begin within 24-48 hr post discharge.

## 2012-01-27 NOTE — Progress Notes (Signed)
Utilization review completed.  

## 2012-02-02 ENCOUNTER — Encounter (HOSPITAL_COMMUNITY): Payer: Self-pay | Admitting: Emergency Medicine

## 2012-02-02 ENCOUNTER — Emergency Department (HOSPITAL_COMMUNITY)
Admission: EM | Admit: 2012-02-02 | Discharge: 2012-02-02 | Disposition: A | Discharge status: Home / Self Care | Payer: Medicare Other | Attending: Emergency Medicine | Admitting: Emergency Medicine

## 2012-02-02 ENCOUNTER — Emergency Department (HOSPITAL_COMMUNITY): Payer: Medicare Other

## 2012-02-02 DIAGNOSIS — R0609 Other forms of dyspnea: Secondary | ICD-10-CM | POA: Insufficient documentation

## 2012-02-02 DIAGNOSIS — I10 Essential (primary) hypertension: Secondary | ICD-10-CM | POA: Insufficient documentation

## 2012-02-02 DIAGNOSIS — I252 Old myocardial infarction: Secondary | ICD-10-CM | POA: Insufficient documentation

## 2012-02-02 DIAGNOSIS — I509 Heart failure, unspecified: Secondary | ICD-10-CM | POA: Insufficient documentation

## 2012-02-02 DIAGNOSIS — K219 Gastro-esophageal reflux disease without esophagitis: Secondary | ICD-10-CM | POA: Insufficient documentation

## 2012-02-02 DIAGNOSIS — Z8659 Personal history of other mental and behavioral disorders: Secondary | ICD-10-CM | POA: Insufficient documentation

## 2012-02-02 DIAGNOSIS — Z87891 Personal history of nicotine dependence: Secondary | ICD-10-CM | POA: Insufficient documentation

## 2012-02-02 DIAGNOSIS — J4489 Other specified chronic obstructive pulmonary disease: Secondary | ICD-10-CM | POA: Insufficient documentation

## 2012-02-02 DIAGNOSIS — Z79899 Other long term (current) drug therapy: Secondary | ICD-10-CM | POA: Insufficient documentation

## 2012-02-02 DIAGNOSIS — J449 Chronic obstructive pulmonary disease, unspecified: Secondary | ICD-10-CM | POA: Insufficient documentation

## 2012-02-02 DIAGNOSIS — E119 Type 2 diabetes mellitus without complications: Secondary | ICD-10-CM | POA: Insufficient documentation

## 2012-02-02 DIAGNOSIS — R0600 Dyspnea, unspecified: Secondary | ICD-10-CM

## 2012-02-02 DIAGNOSIS — R0989 Other specified symptoms and signs involving the circulatory and respiratory systems: Secondary | ICD-10-CM | POA: Insufficient documentation

## 2012-02-02 LAB — CBC
MCH: 33.2 pg (ref 26.0–34.0)
MCHC: 32.4 g/dL (ref 30.0–36.0)
MCV: 102.5 fL — ABNORMAL HIGH (ref 78.0–100.0)
Platelets: 160 10*3/uL (ref 150–400)

## 2012-02-02 LAB — BASIC METABOLIC PANEL
CO2: 30 mEq/L (ref 19–32)
Calcium: 9.7 mg/dL (ref 8.4–10.5)
Creatinine, Ser: 0.82 mg/dL (ref 0.50–1.10)
GFR calc non Af Amer: 77 mL/min — ABNORMAL LOW (ref >90)
Glucose, Bld: 112 mg/dL — ABNORMAL HIGH (ref 70–99)
Sodium: 134 mEq/L — ABNORMAL LOW (ref 135–145)

## 2012-02-02 NOTE — ED Provider Notes (Signed)
History     CSN: [id number]  Arrival date & time 02/02/12  0507   First MD Initiated Contact with Patient 02/02/12 0539      Chief Complaint  Patient presents with  . Shortness of Breath    (Consider location/radiation/quality/duration/timing/severity/associated sxs/prior treatment) The history is provided by the patient.   the patient reports upper respiratory symptoms for the last several days and mild shortness of breath.  She denies fever and reports low-grade chills.  She denies unilateral leg swelling.  She has no prior history of DVT or pulmonary embolism.  She denies chest pain or abdominal pain.  She's had no nausea vomiting or diarrhea.  She denies palpitations.  Her symptoms are mild in severity.  She has not contacted her primary care physician regarding his symptoms.  Past Medical History  Diagnosis Date  . Schizoaffective disorder   . Urinary tract infection   . Hypertension   . Atrial fibrillation   . GERD (gastroesophageal reflux disease)   . Hypothyroidism   . Myocardial infarction   . Diabetes mellitus   . Obesity   . Asthma   . COPD (chronic obstructive pulmonary disease)   . Pneumonia   . CHF (congestive heart failure)   . Seizures     Past Surgical History  Procedure Date  . Tubal ligation   . Abdominal hysterectomy     Family History  Problem Relation Age of Onset  . Heart disease Father   . Cancer Mother 92    Breast    History  Substance Use Topics  . Smoking status: Former Smoker -- 1.0 packs/day for 12 years    Quit date: 06/29/2000  . Smokeless tobacco: Current User    Types: Snuff, Chew  . Alcohol Use: No    OB History    Grav Para Term Preterm Abortions TAB SAB Ect Mult Living                  Review of Systems  All other systems reviewed and are negative.    Allergies  Sulfamethoxazole w-trimethoprim and Penicillins  Home Medications   Current Outpatient Rx  Name Route Sig Dispense Refill  . ACETAMINOPHEN 325 MG  PO TABS Oral Take 650 mg by mouth every 6 (six) hours as needed. For pain    . ALBUTEROL SULFATE HFA 108 (90 BASE) MCG/ACT IN AERS Inhalation Inhale 1 puff into the lungs every 6 (six) hours as needed. For wheezing    . ASPIRIN EC 81 MG PO TBEC Oral Take 81 mg by mouth daily.     . CHLORPROMAZINE HCL 50 MG PO TABS Oral Take 1-4 tablets (50-200 mg total) by mouth 3 (three) times daily. Take 1 tablet (50MG ) twice daily and 4 tablets (200MG ) at bedtime. 180 tablet 0  . DIVALPROEX SODIUM ER 500 MG PO TB24 Oral Take 2 tablets (1,000 mg total) by mouth 2 (two) times daily. 60 tablet 0  . FLUTICASONE PROPIONATE 50 MCG/ACT NA SUSP Nasal Place 2 sprays into the nose daily as needed. For nasal congestion    . FOLIC ACID 1 MG PO TABS Oral Take 1 mg by mouth every morning.     Marland Kitchen LEVOTHYROXINE SODIUM 150 MCG PO TABS Oral Take 150 mcg by mouth daily.    Marland Kitchen LISINOPRIL 20 MG PO TABS Oral Take 1 tablet (20 mg total) by mouth every 12 (twelve) hours. 60 tablet 0  . METOPROLOL SUCCINATE ER 50 MG PO TB24 Oral Take 50 mg by  mouth daily. Take with or immediately following a meal.    . OMEPRAZOLE 20 MG PO CPDR Oral Take 20 mg by mouth daily.     Marland Kitchen PALIPERIDONE ER 6 MG PO TB24 Oral Take 1 tablet (6 mg total) by mouth 2 (two) times daily. 60 tablet 0    BP 138/86  Pulse 82  Temp 98 F (36.7 C) (Oral)  SpO2 99%  Physical Exam  Nursing note and vitals reviewed. Constitutional: She is oriented to person, place, and time. She appears well-developed and well-nourished. No distress.  HENT:  Head: Normocephalic and atraumatic.  Eyes: EOM are normal.  Neck: Normal range of motion.  Cardiovascular: Normal rate, regular rhythm and normal heart sounds.   Pulmonary/Chest: Effort normal and breath sounds normal. No respiratory distress. She has no wheezes. She has no rales.  Abdominal: Soft. She exhibits no distension. There is no tenderness.  Musculoskeletal: Normal range of motion. She exhibits no tenderness.       1+  pitting edema.  No unilateral leg swelling.  Her legs are symmetric  Neurological: She is alert and oriented to person, place, and time.  Skin: Skin is warm and dry.  Psychiatric: She has a normal mood and affect. Judgment normal.    ED Course  Procedures (including critical care time)   Date: 02/02/2012  Rate: 80  Rhythm: normal sinus rhythm  QRS Axis: normal  Intervals: normal  ST/T Wave abnormalities: normal  Conduction Disutrbances: none  Narrative Interpretation:   Old EKG Reviewed: No significant changes noted     Labs Reviewed  CBC - Abnormal; Notable for the following:    MCV 102.5 (*)     All other components within normal limits  BASIC METABOLIC PANEL - Abnormal; Notable for the following:    Sodium 134 (*)     Chloride 94 (*)     Glucose, Bld 112 (*)     GFR calc non Af Amer 77 (*)     GFR calc Af Amer 90 (*)     All other components within normal limits  TROPONIN I  PRO B NATRIURETIC PEPTIDE   Dg Chest 2 View  02/02/2012  *RADIOLOGY REPORT*  Clinical Data: Short of breath.  Wheezing.  CHEST - 2 VIEW  Comparison: 01/26/2012.  Findings: Low volume chest.  Basilar atelectasis. Cardiopericardial silhouette within normal limits. Monitoring leads are projected over the chest.  IMPRESSION: Low volume chest.  No acute cardiopulmonary disease.  Original Report Authenticated By: Andreas Newport, M.D.    I personally reviewed the imaging tests through PACS system  I reviewed available ER/hospitalization records thought the EMR   1. Dyspnea       MDM  The patient is well-appearing.  Her EKG labs and chest x-ray are normal.  She seems to present often with complaints of chest pain and shortness of breath.  I don't believe this to be ACS.  I doubt this to be pulmonary embolism.        Lyanne Co, MD 02/02/12 [phone]

## 2012-02-02 NOTE — ED Notes (Signed)
[id number]<AV> Expected date:<BR> Expected time:<BR> Means of arrival:<BR> Comments:<BR> EMS/on O2-flu like symptoms-not ambulatory

## 2012-02-02 NOTE — ED Notes (Signed)
Brought in by EMS from home with c/o SOB and s/s colds/flu.  Pt presents to room A/Ox4, no s/s acute respiratory distress, on O2 at 2LPM via nasal cannula.  Pt states that she does not use O2 at home.

## 2012-02-08 ENCOUNTER — Emergency Department (HOSPITAL_COMMUNITY): Payer: Medicare Other

## 2012-02-08 ENCOUNTER — Inpatient Hospital Stay (HOSPITAL_COMMUNITY)
Admission: EM | Admit: 2012-02-08 | Discharge: 2012-02-09 | DRG: 313 | Disposition: A | Discharge status: Home / Self Care | Payer: Medicare Other | Attending: Emergency Medicine | Admitting: Emergency Medicine

## 2012-02-08 ENCOUNTER — Encounter (HOSPITAL_COMMUNITY): Payer: Self-pay | Admitting: *Deleted

## 2012-02-08 DIAGNOSIS — F259 Schizoaffective disorder, unspecified: Secondary | ICD-10-CM | POA: Diagnosis present

## 2012-02-08 DIAGNOSIS — F17200 Nicotine dependence, unspecified, uncomplicated: Secondary | ICD-10-CM | POA: Diagnosis present

## 2012-02-08 DIAGNOSIS — J4489 Other specified chronic obstructive pulmonary disease: Secondary | ICD-10-CM | POA: Diagnosis present

## 2012-02-08 DIAGNOSIS — R079 Chest pain, unspecified: Secondary | ICD-10-CM

## 2012-02-08 DIAGNOSIS — G40909 Epilepsy, unspecified, not intractable, without status epilepticus: Secondary | ICD-10-CM | POA: Diagnosis present

## 2012-02-08 DIAGNOSIS — I10 Essential (primary) hypertension: Secondary | ICD-10-CM | POA: Diagnosis present

## 2012-02-08 DIAGNOSIS — E669 Obesity, unspecified: Secondary | ICD-10-CM | POA: Diagnosis present

## 2012-02-08 DIAGNOSIS — I509 Heart failure, unspecified: Secondary | ICD-10-CM | POA: Diagnosis present

## 2012-02-08 DIAGNOSIS — E119 Type 2 diabetes mellitus without complications: Secondary | ICD-10-CM | POA: Diagnosis present

## 2012-02-08 DIAGNOSIS — I252 Old myocardial infarction: Secondary | ICD-10-CM

## 2012-02-08 DIAGNOSIS — E039 Hypothyroidism, unspecified: Secondary | ICD-10-CM | POA: Diagnosis present

## 2012-02-08 DIAGNOSIS — K219 Gastro-esophageal reflux disease without esophagitis: Secondary | ICD-10-CM | POA: Diagnosis present

## 2012-02-08 DIAGNOSIS — Z79899 Other long term (current) drug therapy: Secondary | ICD-10-CM

## 2012-02-08 DIAGNOSIS — J449 Chronic obstructive pulmonary disease, unspecified: Secondary | ICD-10-CM | POA: Diagnosis present

## 2012-02-08 DIAGNOSIS — Z7982 Long term (current) use of aspirin: Secondary | ICD-10-CM

## 2012-02-08 DIAGNOSIS — I4891 Unspecified atrial fibrillation: Secondary | ICD-10-CM | POA: Diagnosis present

## 2012-02-08 LAB — CBC WITH DIFFERENTIAL/PLATELET
Eosinophils Absolute: 0.1 10*3/uL (ref 0.0–0.7)
Eosinophils Relative: 2 % (ref 0–5)
HCT: 37.7 % (ref 36.0–46.0)
Lymphs Abs: 2.6 10*3/uL (ref 0.7–4.0)
MCH: 33.2 pg (ref 26.0–34.0)
MCV: 101.1 fL — ABNORMAL HIGH (ref 78.0–100.0)
Monocytes Absolute: 1.1 10*3/uL — ABNORMAL HIGH (ref 0.1–1.0)
Platelets: 184 10*3/uL (ref 150–400)
RBC: 3.73 MIL/uL — ABNORMAL LOW (ref 3.87–5.11)

## 2012-02-08 MED ORDER — FUROSEMIDE 10 MG/ML IJ SOLN
40.0000 mg | Freq: Once | INTRAMUSCULAR | Status: AC
  Administered 2012-02-08: 40 mg via INTRAVENOUS
  Filled 2012-02-08: qty 4

## 2012-02-08 NOTE — ED Notes (Signed)
Pt placed on continuous pulse ox and cardiac monitor. Given blankets.

## 2012-02-08 NOTE — ED Notes (Signed)
EKG completed at 2240 and given to Dr. Denton Lank along with OLD ekg.

## 2012-02-08 NOTE — ED Notes (Signed)
Pt c/o says she fell today over the side of the bathtub. Multiple c/o pain.  Chest tender to palpation

## 2012-02-09 ENCOUNTER — Emergency Department (HOSPITAL_COMMUNITY): Payer: Medicare Other

## 2012-02-09 LAB — TROPONIN I: Troponin I: <0.3 ng/mL (ref <0.30)

## 2012-02-09 LAB — URINALYSIS, MICROSCOPIC ONLY
Ketones, ur: NEGATIVE mg/dL
Leukocytes, UA: NEGATIVE
Nitrite: NEGATIVE
Protein, ur: NEGATIVE mg/dL
Urobilinogen, UA: 0.2 mg/dL (ref 0.0–1.0)

## 2012-02-09 LAB — COMPREHENSIVE METABOLIC PANEL
ALT: 11 U/L (ref 0–35)
BUN: 7 mg/dL (ref 6–23)
Calcium: 9.8 mg/dL (ref 8.4–10.5)
Creatinine, Ser: 0.8 mg/dL (ref 0.50–1.10)
GFR calc Af Amer: >90 mL/min (ref >90)
GFR calc non Af Amer: 80 mL/min — ABNORMAL LOW (ref >90)
Glucose, Bld: 100 mg/dL — ABNORMAL HIGH (ref 70–99)
Sodium: 137 mEq/L (ref 135–145)
Total Protein: 6.5 g/dL (ref 6.0–8.3)

## 2012-02-09 LAB — PRO B NATRIURETIC PEPTIDE: Pro B Natriuretic peptide (BNP): 44.3 pg/mL (ref 0–125)

## 2012-02-09 NOTE — ED Provider Notes (Signed)
History     CSN: [id number]  Arrival date & time 02/08/12  2232   First MD Initiated Contact with Patient 02/08/12 2308      Chief Complaint  Patient presents with  . Chest Pain    (Consider location/radiation/quality/duration/timing/severity/associated sxs/prior treatment) Patient is a 61 y.o. female presenting with chest pain. The history is provided by the patient (the pt complains of chest pain and falling).  Chest Pain The chest pain began 12 - 24 hours ago. Chest pain occurs constantly. The chest pain is unchanged. The pain is associated with stress. At its most intense, the pain is at 5/10. The pain is currently at 3/10. The severity of the pain is moderate. The quality of the pain is described as aching. The pain does not radiate. Chest pain is worsened by stress. Pertinent negatives for primary symptoms include no fever, no fatigue, no cough and no abdominal pain.  Pertinent negatives for past medical history include no seizures.     Past Medical History  Diagnosis Date  . Schizoaffective disorder   . Urinary tract infection   . Hypertension   . Atrial fibrillation   . GERD (gastroesophageal reflux disease)   . Hypothyroidism   . Myocardial infarction   . Diabetes mellitus   . Obesity   . Asthma   . COPD (chronic obstructive pulmonary disease)   . Pneumonia   . CHF (congestive heart failure)   . Seizures     Past Surgical History  Procedure Date  . Tubal ligation   . Abdominal hysterectomy     Family History  Problem Relation Age of Onset  . Heart disease Father   . Cancer Mother 90    Breast    History  Substance Use Topics  . Smoking status: Former Smoker -- 1.0 packs/day for 12 years    Quit date: 06/29/2000  . Smokeless tobacco: Current User    Types: Snuff, Chew  . Alcohol Use: No    OB History    Grav Para Term Preterm Abortions TAB SAB Ect Mult Living                  Review of Systems  Constitutional: Negative for fever and  fatigue.  HENT: Negative for congestion, sinus pressure and ear discharge.   Eyes: Negative for discharge.  Respiratory: Negative for cough.   Cardiovascular: Positive for chest pain.  Gastrointestinal: Negative for abdominal pain and diarrhea.  Genitourinary: Negative for frequency and hematuria.  Musculoskeletal: Negative for back pain.       Swelling in legs  Skin: Negative for rash.  Neurological: Negative for seizures and headaches.  Hematological: Negative.   Psychiatric/Behavioral: Negative for hallucinations.    Allergies  Sulfamethoxazole w-trimethoprim and Penicillins  Home Medications   Current Outpatient Rx  Name Route Sig Dispense Refill  . ACETAMINOPHEN 325 MG PO TABS Oral Take 650 mg by mouth every 6 (six) hours as needed. For pain    . ALBUTEROL SULFATE HFA 108 (90 BASE) MCG/ACT IN AERS Inhalation Inhale 1 puff into the lungs every 6 (six) hours as needed. For wheezing    . ASPIRIN EC 81 MG PO TBEC Oral Take 81 mg by mouth daily.     . CHLORPROMAZINE HCL 50 MG PO TABS Oral Take 1-4 tablets (50-200 mg total) by mouth 3 (three) times daily. Take 1 tablet (50MG ) twice daily and 4 tablets (200MG ) at bedtime. 180 tablet 0  . DIVALPROEX SODIUM ER 500 MG PO TB24 Oral  Take 2 tablets (1,000 mg total) by mouth 2 (two) times daily. 60 tablet 0  . FLUTICASONE PROPIONATE 50 MCG/ACT NA SUSP Nasal Place 2 sprays into the nose daily as needed. For nasal congestion    . FOLIC ACID 1 MG PO TABS Oral Take 1 mg by mouth every morning.     Marland Kitchen LEVOTHYROXINE SODIUM 150 MCG PO TABS Oral Take 150 mcg by mouth daily.    Marland Kitchen LISINOPRIL 20 MG PO TABS Oral Take 1 tablet (20 mg total) by mouth every 12 (twelve) hours. 60 tablet 0  . METOPROLOL SUCCINATE ER 50 MG PO TB24 Oral Take 50 mg by mouth daily. Take with or immediately following a meal.    . OMEPRAZOLE 20 MG PO CPDR Oral Take 20 mg by mouth daily.     Marland Kitchen PALIPERIDONE ER 6 MG PO TB24 Oral Take 1 tablet (6 mg total) by mouth 2 (two) times daily.  60 tablet 0    BP 150/98  Pulse 69  Temp 97.9 F (36.6 C) (Oral)  Resp 14  SpO2 99%  Physical Exam  Constitutional: She is oriented to person, place, and time. She appears well-developed.  HENT:  Head: Normocephalic and atraumatic.  Eyes: Conjunctivae and EOM are normal. No scleral icterus.  Neck: Neck supple. No thyromegaly present.  Cardiovascular: Normal rate and regular rhythm.  Exam reveals no gallop and no friction rub.   No murmur heard. Pulmonary/Chest: No stridor. She has no wheezes. She has no rales. She exhibits no tenderness.  Abdominal: She exhibits no distension. There is no tenderness. There is no rebound.  Musculoskeletal: Normal range of motion. She exhibits edema.  Lymphadenopathy:    She has no cervical adenopathy.  Neurological: She is oriented to person, place, and time. Coordination normal.  Skin: No rash noted. No erythema.  Psychiatric: She has a normal mood and affect. Her behavior is normal.    ED Course  Procedures (including critical care time)  Labs Reviewed  CBC WITH DIFFERENTIAL - Abnormal; Notable for the following:    RBC 3.73 (*)     MCV 101.1 (*)     Monocytes Relative 15 (*)     Monocytes Absolute 1.1 (*)     All other components within normal limits  COMPREHENSIVE METABOLIC PANEL - Abnormal; Notable for the following:    CO2 33 (*)     Glucose, Bld 100 (*)     Albumin 3.1 (*)     Total Bilirubin 0.2 (*)     GFR calc non Af Amer 80 (*)     All other components within normal limits  TROPONIN I  PRO B NATRIURETIC PEPTIDE  URINALYSIS, WITH MICROSCOPIC   Ct Head Wo Contrast  02/09/2012  *RADIOLOGY REPORT*  Clinical Data: Recent falls.  CT HEAD WITHOUT CONTRAST  Technique:  Contiguous axial images were obtained from the base of the skull through the vertex without contrast.  Comparison: CT 05/17/2011  Findings: Generalized atrophy.  Negative for acute infarct. Negative for hemorrhage mass or skull fracture.  IMPRESSION: Generalized  atrophy without acute abnormality.   Original Report Authenticated By: Camelia Phenes, M.D.    Dg Chest Port 1 View  02/08/2012  *RADIOLOGY REPORT*  Clinical Data: Short of breath.  Multiple falls  PORTABLE CHEST - 1 VIEW  Comparison: 02/02/2012  Findings: Cardiac enlargement.  Negative for heart failure or pneumonia.  No pleural effusion is identified.  The lungs are clear.  IMPRESSION: Cardiac enlargement without acute abnormality.  Original Report Authenticated By: Camelia Phenes, M.D.      1. Chest pain     Date: 02/09/2012  Rate: 72  Rhythm: normal sinus rhythm  QRS Axis: normal  Intervals: normal  ST/T Wave abnormalities: nonspecific ST changes  Conduction Disutrbances:none  Narrative Interpretation:   Old EKG Reviewed: unchanged    MDM          Benny Lennert, MD 02/09/12 [phone]

## 2012-02-09 NOTE — ED Notes (Signed)
[facility] called for transport.  

## 2012-02-09 NOTE — ED Notes (Signed)
Pt. Transported home via [facility]

## 2012-02-09 NOTE — ED Provider Notes (Signed)
  Physical Exam  BP 142/85  Pulse 74  Temp 97.9 F (36.6 C) (Oral)  Resp 18  SpO2 95%  Physical Exam  ED Course  Procedures  MDM Patient's been seen by social work. She'll be discharged home. Patient's heart has home health. Patient states she's eager to go home at this time.      Juliet Rude. Rubin Payor, MD 02/09/12 1000

## 2012-02-09 NOTE — Progress Notes (Signed)
02/09/12 0947  Discharge Planning  Type of Residence Private residence  Living Arrangements Spouse/significant other  Home Care Services Yes ([facility] and PT)  Type of Home Care Services Home PT;Home RN;Other (Comment) ([facility] ACT services )  Home Care Agency (if known) [facility]  Support Systems Spouse/significant other;Case manager/social worker;Home care staff;Therapist  Do you have any problems obtaining your medications? No  Family/patient expects to be discharged to: Private residence     CSW met with Pt who reports that she thought that she is going to be admitted. CSW shared that the plan is to DC home from the ED. Pt stated that her fiance is on the way here and she does not know how she will get home since she doesn't take the bus.  CSW advised that since Pt is not safe ambulating, that [facility] will be contacted for transport. Pt confirmed that she is still active with home health services and [facility] ACTT services (intensive in-home psych services).  Pt was laughing and joking with CSW by the end of the conversation. Pt asked CSW to assist her with getting back into her clothes, CSW notified tech of Pt's need.  CSW updated nursing and MD. No further CSW needs.

## 2012-02-09 NOTE — ED Notes (Signed)
MD at bedside. 

## 2012-02-11 IMAGING — CR DG CHEST 2V
2 series · 2 of 2 positions shown · non-contrast
Comparison: Chest 08/29/2010.

CLINICAL DATA: Shortness of breath, chest pain and fever.

CHEST - 2 VIEW

[w chest pa]
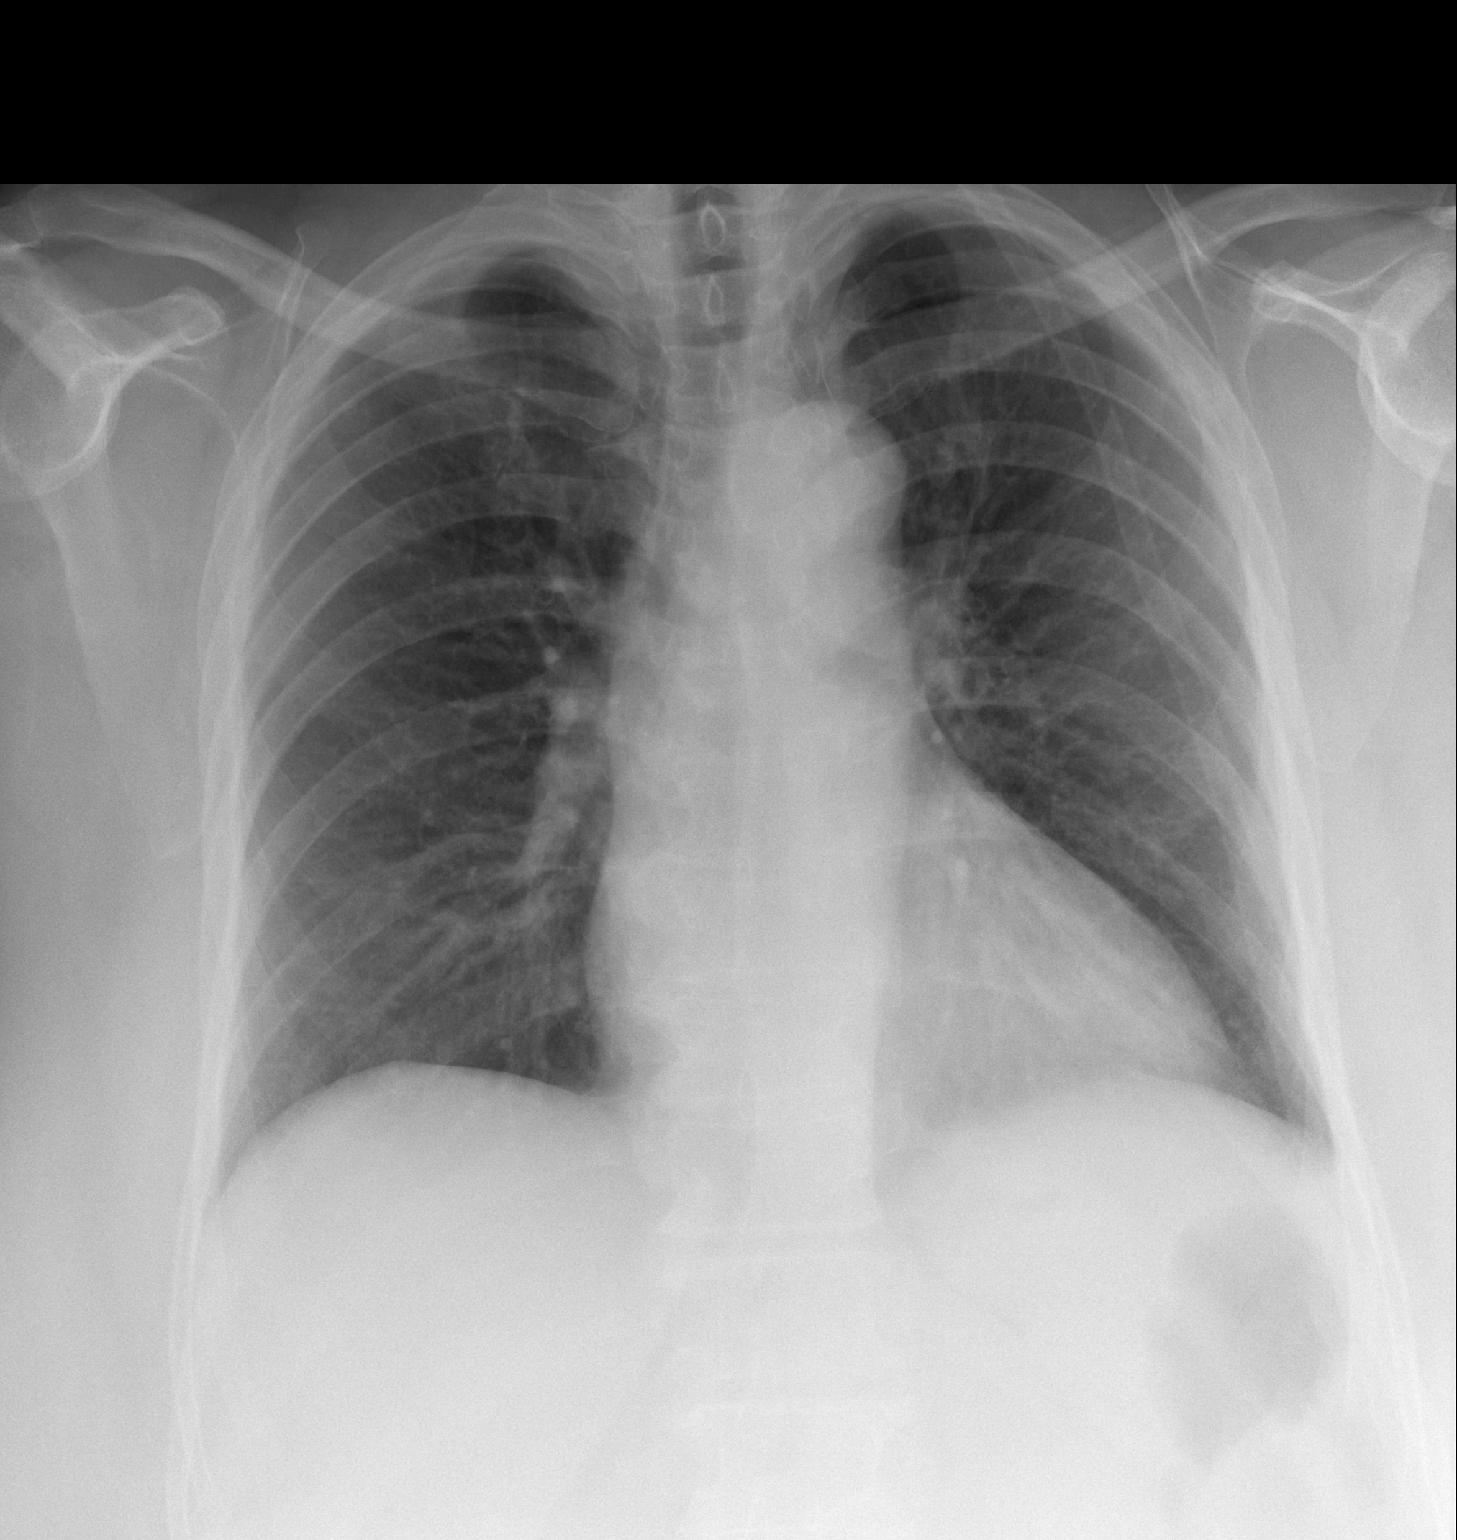

[w chest lat]
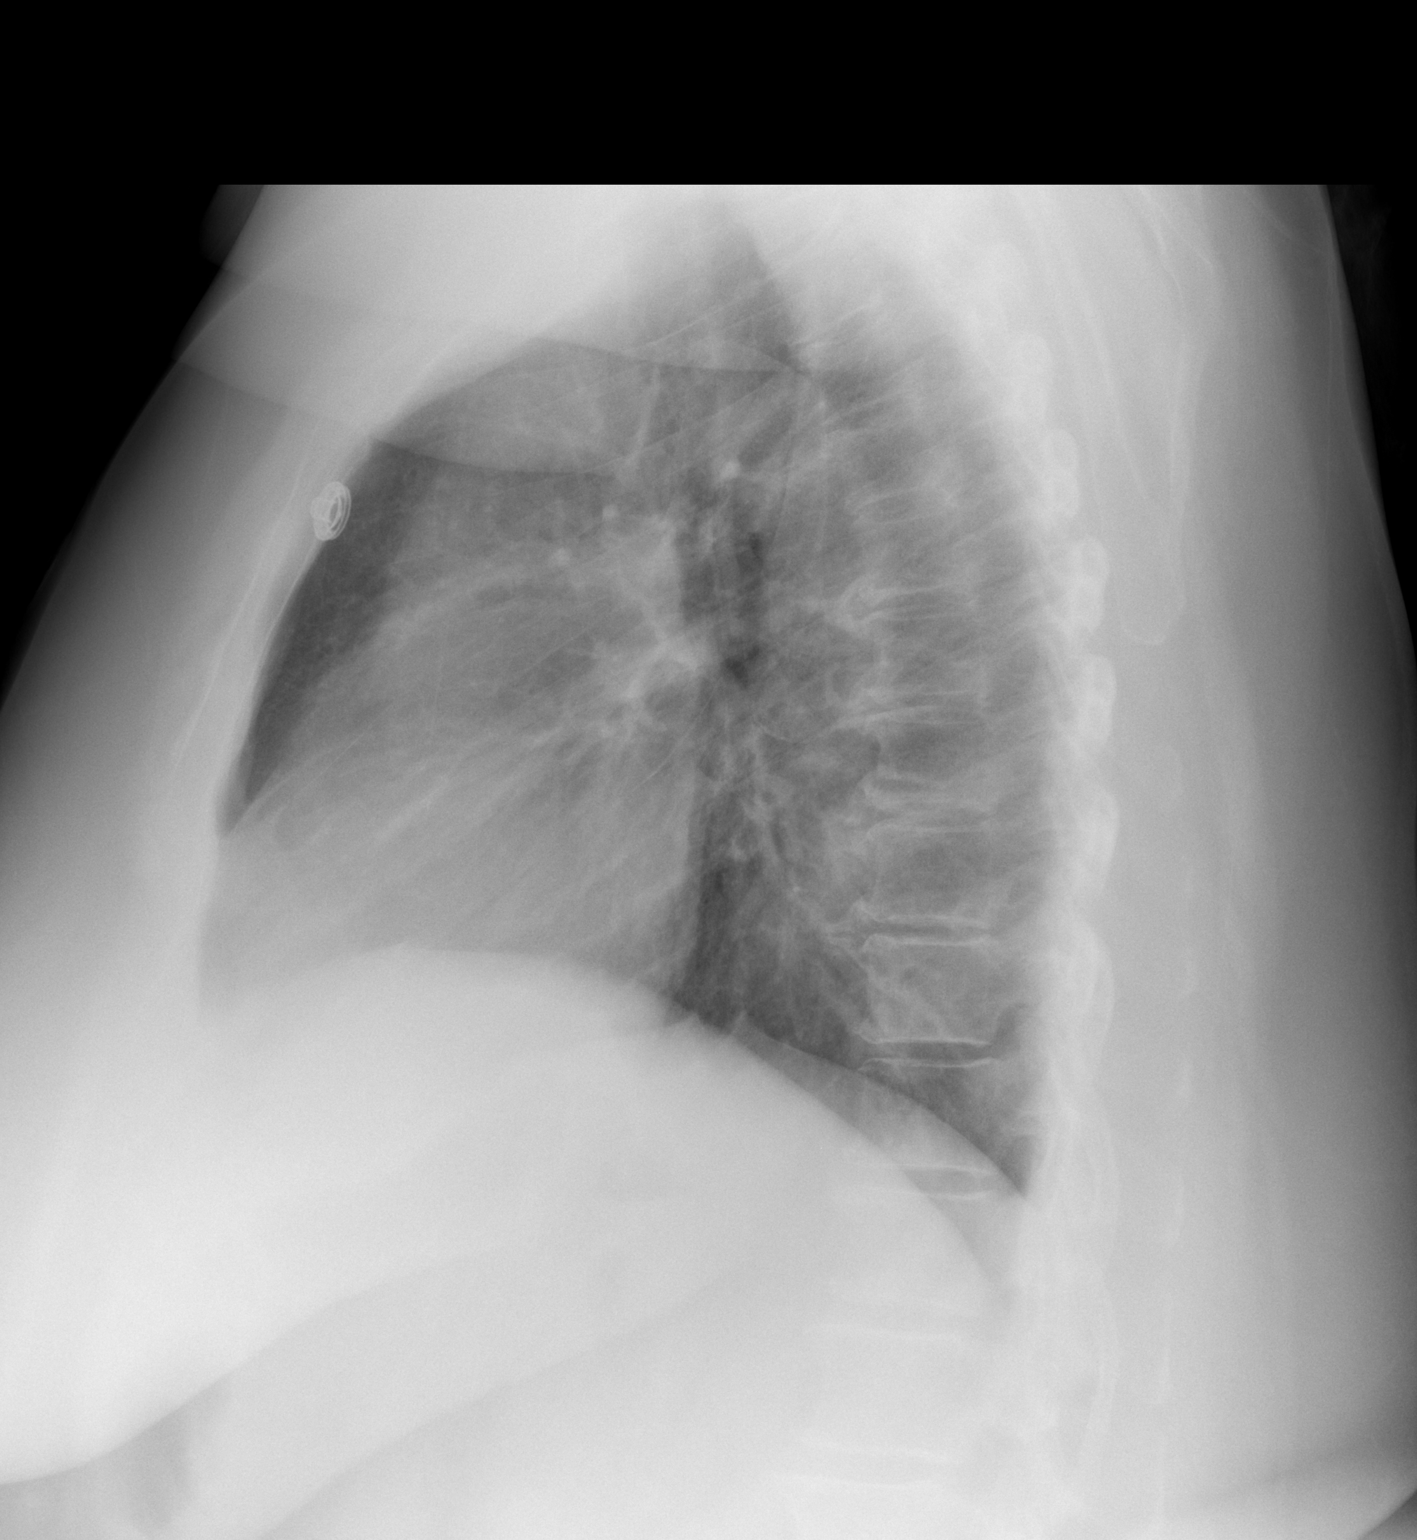

[2 of 2 positions shown; findings below may reference images not displayed]

FINDINGS: Lungs are clear.  Heart size is normal.  No pneumothorax
or pleural effusion.
IMPRESSION: No acute disease.

## 2012-02-13 ENCOUNTER — Emergency Department (HOSPITAL_COMMUNITY)
Admission: EM | Admit: 2012-02-13 | Discharge: 2012-02-14 | Disposition: A | Discharge status: Home / Self Care | Payer: Medicare Other | Attending: Emergency Medicine | Admitting: Emergency Medicine

## 2012-02-13 DIAGNOSIS — I10 Essential (primary) hypertension: Secondary | ICD-10-CM | POA: Insufficient documentation

## 2012-02-13 DIAGNOSIS — R079 Chest pain, unspecified: Secondary | ICD-10-CM | POA: Insufficient documentation

## 2012-02-13 DIAGNOSIS — Z79899 Other long term (current) drug therapy: Secondary | ICD-10-CM | POA: Insufficient documentation

## 2012-02-13 DIAGNOSIS — I252 Old myocardial infarction: Secondary | ICD-10-CM | POA: Insufficient documentation

## 2012-02-14 ENCOUNTER — Emergency Department (HOSPITAL_COMMUNITY): Payer: Medicare Other

## 2012-02-14 ENCOUNTER — Encounter (HOSPITAL_COMMUNITY): Payer: Self-pay | Admitting: *Deleted

## 2012-02-14 LAB — URINALYSIS, ROUTINE W REFLEX MICROSCOPIC
Bilirubin Urine: NEGATIVE
Hgb urine dipstick: NEGATIVE
Ketones, ur: NEGATIVE mg/dL
Nitrite: NEGATIVE
Specific Gravity, Urine: 1.019 (ref 1.005–1.030)
Urobilinogen, UA: 1 mg/dL (ref 0.0–1.0)

## 2012-02-14 LAB — POCT I-STAT, CHEM 8
Calcium, Ion: 1.28 mmol/L — ABNORMAL HIGH (ref 1.12–1.23)
Creatinine, Ser: 1 mg/dL (ref 0.50–1.10)
Glucose, Bld: 101 mg/dL — ABNORMAL HIGH (ref 70–99)
HCT: 39 % (ref 36.0–46.0)
Hemoglobin: 13.3 g/dL (ref 12.0–15.0)

## 2012-02-14 LAB — CBC
Hemoglobin: 12.5 g/dL (ref 12.0–15.0)
MCH: 32.9 pg (ref 26.0–34.0)
MCV: 100.8 fL — ABNORMAL HIGH (ref 78.0–100.0)
RBC: 3.8 MIL/uL — ABNORMAL LOW (ref 3.87–5.11)
WBC: 7 10*3/uL (ref 4.0–10.5)

## 2012-02-14 LAB — POCT I-STAT TROPONIN I: Troponin i, poc: 0 ng/mL (ref 0.00–0.08)

## 2012-02-14 IMAGING — CR DG CHEST 2V
1 series · 1 of 1 positions shown · non-contrast
Comparison: 10/14/2010

CLINICAL DATA: Fever.  Chest pain.  Shortness of breath.

CHEST - 2 VIEW

[w chest lat]
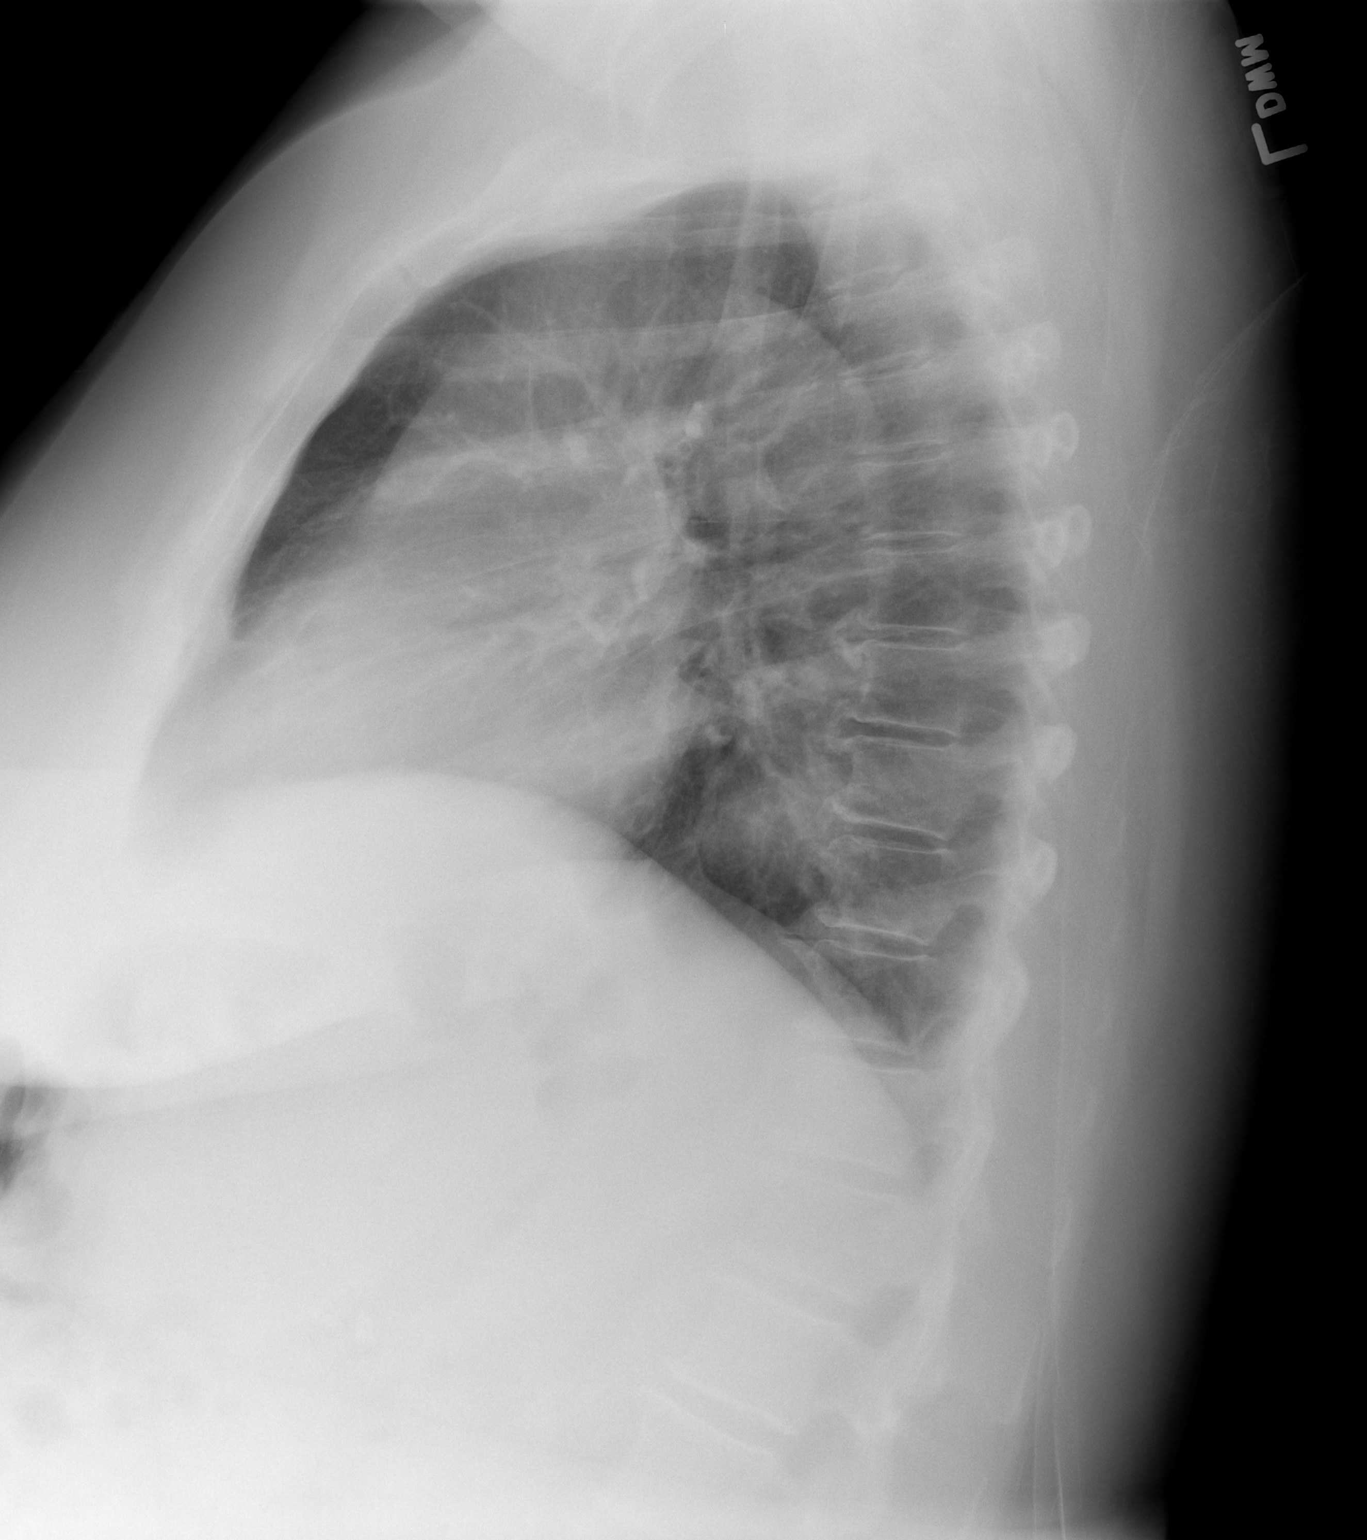

[1 of 1 positions shown; findings below may reference images not displayed]

FINDINGS: Borderline cardiomegaly noted, without edema.  No pleural
effusion noted.  Vague density projecting anterior to the lower
thoracic spine is similar to prior exams are probably represents
superimposition of vascular and osseous shadows.
IMPRESSION: 1.  Borderline cardiomegaly.  Otherwise negative.

## 2012-02-14 MED ORDER — NITROGLYCERIN 0.4 MG SL SUBL
0.4000 mg | SUBLINGUAL_TABLET | SUBLINGUAL | Status: DC | PRN
  Administered 2012-02-14: 0.4 mg via SUBLINGUAL

## 2012-02-14 NOTE — ED Notes (Signed)
Pt utilizing call bell 3x asking for pain then nausea meds during the time of writing this note. MD aware.

## 2012-02-14 NOTE — ED Notes (Addendum)
Charge nurse Reita Cliche in to see patient per patient request

## 2012-02-14 NOTE — ED Notes (Signed)
Pt c/o pain and using the callbell frequently. Assigned RN aware of Pts request. Pt very anxious. Pt does not have orders for pain meds at this time. Physician has seen patient and has been made aware.

## 2012-02-14 NOTE — ED Notes (Addendum)
Pt c/o SOB to pharmacy tech who relayed to this RN. No SOB noted on entering room.

## 2012-02-14 NOTE — ED Notes (Addendum)
Pt in via EMS- per EMS, pt in c/o chest pain since 7pm today, pt has been seen multiple times for same and states this is the same pain, IV started PTA, pt rates pain 10/10 and describes pain as pressure. Pt given 324 ASA and nitro SL x3. 20g in left wrist by EMS.

## 2012-02-14 NOTE — ED Notes (Signed)
Assisted pt onto bedpan. Urine specimen collected and sent to lab.

## 2012-02-14 NOTE — ED Notes (Addendum)
Pt utilizing call bell and wants to see nurse. Pt states that she doesn't want to go home and wants MD to call her cardiologist. Explained that her labs are normal and that MD is planning on discharging her. Pt also still complaining of pain. MD made aware

## 2012-02-14 NOTE — ED Notes (Addendum)
Departed with [facility] after D/C home. Home instructions given to patient.

## 2012-02-14 NOTE — ED Notes (Signed)
MD notified of patient request for pain medication, order received for nitro SL for patient chest pain.

## 2012-02-14 NOTE — ED Notes (Signed)
Pt continues to utilize call bell to ask for pain medicine despite SLNTG just being given and RN just exiting room.

## 2012-02-14 NOTE — ED Provider Notes (Signed)
History     CSN: [id number]  Arrival date & time 02/13/12  2352   First MD Initiated Contact with Patient 02/14/12 0053      Chief Complaint  Patient presents with  . Chest Pain    (Consider location/radiation/quality/duration/timing/severity/associated sxs/prior treatment) HPI HX per PT. Multiple complaints, not feeling well at home tonight, states she fell down, tripped on something at home, having CP the last 3 days and feels like she needs to be admitted to the hospital. PAIN IS UNCHANGED IN CHARACTER AND QUALITY. No radiating form center of chest. Pressure and sharp.no aggrevating or alleviating factors. Followed by Dr Sharyn Lull. No cough, fevers, leg pain or swelling. H/o multiple ED visits with similar complaints in the past. No new symptoms. No wheezing or resp complaints. No diaphoresis. No nausea.  Past Medical History  Diagnosis Date  . Schizoaffective disorder   . Urinary tract infection   . Hypertension   . Atrial fibrillation   . GERD (gastroesophageal reflux disease)   . Hypothyroidism   . Myocardial infarction   . Diabetes mellitus   . Obesity   . Asthma   . COPD (chronic obstructive pulmonary disease)   . Pneumonia   . CHF (congestive heart failure)   . Seizures     Past Surgical History  Procedure Date  . Tubal ligation   . Abdominal hysterectomy     Family History  Problem Relation Age of Onset  . Heart disease Father   . Cancer Mother 43    Breast    History  Substance Use Topics  . Smoking status: Former Smoker -- 1.0 packs/day for 12 years    Quit date: 06/29/2000  . Smokeless tobacco: Current User    Types: Snuff, Chew  . Alcohol Use: No    OB History    Grav Para Term Preterm Abortions TAB SAB Ect Mult Living                  Review of Systems  Constitutional: Negative for fever and chills.  HENT: Negative for neck pain and neck stiffness.   Eyes: Negative for pain.  Respiratory: Negative for shortness of breath.     Cardiovascular: Positive for chest pain.  Gastrointestinal: Negative for abdominal pain.  Genitourinary: Negative for dysuria.  Musculoskeletal: Negative for back pain.  Skin: Negative for rash.  Neurological: Negative for syncope and headaches.  All other systems reviewed and are negative.    Allergies  Sulfamethoxazole w-trimethoprim and Penicillins  Home Medications   Current Outpatient Rx  Name Route Sig Dispense Refill  . ACETAMINOPHEN 325 MG PO TABS Oral Take 650 mg by mouth every 6 (six) hours as needed. For pain    . ALBUTEROL SULFATE HFA 108 (90 BASE) MCG/ACT IN AERS Inhalation Inhale 1 puff into the lungs every 6 (six) hours as needed. For wheezing    . ASPIRIN EC 81 MG PO TBEC Oral Take 81 mg by mouth daily.     . CHLORPROMAZINE HCL 50 MG PO TABS Oral Take 1-4 tablets (50-200 mg total) by mouth 3 (three) times daily. Take 1 tablet (50MG ) twice daily and 4 tablets (200MG ) at bedtime. 180 tablet 0  . DIVALPROEX SODIUM ER 500 MG PO TB24 Oral Take 2 tablets (1,000 mg total) by mouth 2 (two) times daily. 60 tablet 0  . FLUTICASONE PROPIONATE 50 MCG/ACT NA SUSP Nasal Place 2 sprays into the nose daily as needed. For nasal congestion    . FOLIC ACID 1 MG  PO TABS Oral Take 1 mg by mouth every morning.     Marland Kitchen LEVOTHYROXINE SODIUM 150 MCG PO TABS Oral Take 150 mcg by mouth daily.    Marland Kitchen LISINOPRIL 20 MG PO TABS Oral Take 1 tablet (20 mg total) by mouth every 12 (twelve) hours. 60 tablet 0  . METOPROLOL SUCCINATE ER 50 MG PO TB24 Oral Take 50 mg by mouth daily. Take with or immediately following a meal.    . OMEPRAZOLE 20 MG PO CPDR Oral Take 20 mg by mouth daily.     Marland Kitchen PALIPERIDONE ER 6 MG PO TB24 Oral Take 1 tablet (6 mg total) by mouth 2 (two) times daily. 60 tablet 0    BP 111/71  Pulse 73  Temp 97.4 F (36.3 C) (Oral)  Resp 25  SpO2 100%  Physical Exam  Constitutional: She is oriented to person, place, and time. She appears well-developed and well-nourished.  HENT:  Head:  Normocephalic and atraumatic.  Eyes: Conjunctivae and EOM are normal. Pupils are equal, round, and reactive to light.  Neck: Trachea normal. Neck supple. No thyromegaly present.  Cardiovascular: Normal rate, regular rhythm, S1 normal, S2 normal and normal pulses.     No systolic murmur is present   No diastolic murmur is present  Pulses:      Radial pulses are 2+ on the right side, and 2+ on the left side.  Pulmonary/Chest: Effort normal and breath sounds normal. She has no wheezes. She has no rhonchi. She has no rales. She exhibits no tenderness.  Abdominal: Soft. Normal appearance and bowel sounds are normal. There is no tenderness. There is no CVA tenderness and negative Murphy's sign.  Musculoskeletal:       BLE:s Calves nontender, no cords or erythema, negative Homans sign  Neurological: She is alert and oriented to person, place, and time. She has normal strength. No cranial nerve deficit or sensory deficit. GCS eye subscore is 4. GCS verbal subscore is 5. GCS motor subscore is 6.  Skin: Skin is warm and dry. No rash noted. She is not diaphoretic.  Psychiatric: Her speech is normal.       Cooperative and appropriate    ED Course  Procedures (including critical care time)  Results for orders placed during the hospital encounter of 02/13/12  POCT I-STAT, CHEM 8      Component Value Range   Sodium 138  135 - 145 mEq/L   Potassium 4.3  3.5 - 5.1 mEq/L   Chloride 98  96 - 112 mEq/L   BUN 8  6 - 23 mg/dL   Creatinine, Ser 1.61  0.50 - 1.10 mg/dL   Glucose, Bld 096 (*) 70 - 99 mg/dL   Calcium, Ion 0.45 (*) 1.12 - 1.23 mmol/L   TCO2 28  0 - 100 mmol/L   Hemoglobin 13.3  12.0 - 15.0 g/dL   HCT 40.9  81.1 - 91.4 %  URINALYSIS, ROUTINE W REFLEX MICROSCOPIC      Component Value Range   Color, Urine YELLOW  YELLOW   APPearance CLOUDY (*) CLEAR   Specific Gravity, Urine 1.019  1.005 - 1.030   pH 7.0  5.0 - 8.0   Glucose, UA NEGATIVE  NEGATIVE mg/dL   Hgb urine dipstick NEGATIVE   NEGATIVE   Bilirubin Urine NEGATIVE  NEGATIVE   Ketones, ur NEGATIVE  NEGATIVE mg/dL   Protein, ur NEGATIVE  NEGATIVE mg/dL   Urobilinogen, UA 1.0  0.0 - 1.0 mg/dL   Nitrite NEGATIVE  NEGATIVE   Leukocytes, UA NEGATIVE  NEGATIVE  POCT I-STAT TROPONIN I      Component Value Range   Troponin i, poc 0.00  0.00 - 0.08 ng/mL   Comment 3           CBC      Component Value Range   WBC 7.0  4.0 - 10.5 K/uL   RBC 3.80 (*) 3.87 - 5.11 MIL/uL   Hemoglobin 12.5  12.0 - 15.0 g/dL   HCT 16.1  09.6 - 04.5 %   MCV 100.8 (*) 78.0 - 100.0 fL   MCH 32.9  26.0 - 34.0 pg   MCHC 32.6  30.0 - 36.0 g/dL   RDW 40.9  81.1 - 91.4 %   Platelets 130 (*) 150 - 400 K/uL  POCT I-STAT TROPONIN I      Component Value Range   Troponin i, poc 0.00  0.00 - 0.08 ng/mL   Comment 3            Dg Chest 1 View  01/19/2012  *RADIOLOGY REPORT*  Clinical Data: Fever, shortness of breath  CHEST - 1 VIEW  Comparison: 01/12/2012  Findings: Cardiomegaly again noted.  The exam is limited by patient's large body habitus.  Mild elevation of the right hemidiaphragm.  No acute infiltrate or pulmonary edema.  IMPRESSION: Limited study by patient's large body habitus.  Cardiomegaly again noted.  No active disease.  Original Report Authenticated By: Natasha Mead, M.D.   Dg Chest 2 View  02/02/2012  *RADIOLOGY REPORT*  Clinical Data: Short of breath.  Wheezing.  CHEST - 2 VIEW  Comparison: 01/26/2012.  Findings: Low volume chest.  Basilar atelectasis. Cardiopericardial silhouette within normal limits. Monitoring leads are projected over the chest.  IMPRESSION: Low volume chest.  No acute cardiopulmonary disease.  Original Report Authenticated By: Andreas Newport, M.D.   Dg Chest 2 View  01/26/2012  *RADIOLOGY REPORT*  Clinical Data: Cough.  Shortness of breath.  Chest pain.  CHEST - 2 VIEW  Comparison: 01/19/2012  Findings: Shallow inspiration. The heart size and pulmonary vascularity are normal. The lungs appear clear and expanded without focal  air space disease or consolidation. No blunting of the costophrenic angles.  No pneumothorax.  Mediastinal contours appear intact.  Degenerative changes in the spine.  No significant change since previous study.  IMPRESSION: No evidence of active pulmonary disease.  Original Report Authenticated By: Marlon Pel, M.D.   Ct Head Wo Contrast  02/09/2012  *RADIOLOGY REPORT*  Clinical Data: Recent falls.  CT HEAD WITHOUT CONTRAST  Technique:  Contiguous axial images were obtained from the base of the skull through the vertex without contrast.  Comparison: CT 05/17/2011  Findings: Generalized atrophy.  Negative for acute infarct. Negative for hemorrhage mass or skull fracture.  IMPRESSION: Generalized atrophy without acute abnormality.   Original Report Authenticated By: Camelia Phenes, M.D.    Dg Chest Portable 1 View  02/14/2012  *RADIOLOGY REPORT*  Clinical Data: Chest pain  PORTABLE CHEST - 1 VIEW  Comparison: 02/08/2012  Findings: No significant interval change.  Cardiomegaly. Mediastinal contours otherwise upper normal limits.  Mild lung base atelectasis. Hypoaeration with interstitial and vascular crowding. No acute osseous finding.  No pleural effusion or pneumothorax.  IMPRESSION: Cardiomegaly.  Hypoaeration with mild interstitial prominence and dependent atelectasis.   Original Report Authenticated By: Waneta Martins, M.D.    Dg Chest Port 1 View  02/08/2012  *RADIOLOGY REPORT*  Clinical Data: Short of breath.  Multiple  falls  PORTABLE CHEST - 1 VIEW  Comparison: 02/02/2012  Findings: Cardiac enlargement.  Negative for heart failure or pneumonia.  No pleural effusion is identified.  The lungs are clear.  IMPRESSION: Cardiac enlargement without acute abnormality.   Original Report Authenticated By: Camelia Phenes, M.D.      Date: 02/14/2012  Rate: 72  Rhythm: normal sinus rhythm  QRS Axis: normal  Intervals: normal  ST/T Wave abnormalities: nonspecific ST changes  Conduction  Disutrbances:none  Narrative Interpretation:   Old EKG Reviewed: unchanged  Old records reviewed, doubt ACS. Presentation does not suggest PE/ dissection. Serial troponins negative. NTG and ASA provided. Plan follow up CAR in am - PT agrees to all discharge and follow up instructions.    Presentation complicated by h/o schizoaffective disorder. No hallucinations, SI or HI. No indication for further cardiac work up at this time.  MDM   Symptoms evaluated with ECG, imaging and labs all reviewed as above. Pain improved and stable for discharge home.         Sunnie Nielsen, MD 02/14/12 [phone]

## 2012-02-15 DIAGNOSIS — F259 Schizoaffective disorder, unspecified: Secondary | ICD-10-CM | POA: Insufficient documentation

## 2012-02-15 DIAGNOSIS — J4489 Other specified chronic obstructive pulmonary disease: Secondary | ICD-10-CM | POA: Insufficient documentation

## 2012-02-15 DIAGNOSIS — J449 Chronic obstructive pulmonary disease, unspecified: Secondary | ICD-10-CM | POA: Insufficient documentation

## 2012-02-15 DIAGNOSIS — I10 Essential (primary) hypertension: Secondary | ICD-10-CM | POA: Insufficient documentation

## 2012-02-15 DIAGNOSIS — Z880 Allergy status to penicillin: Secondary | ICD-10-CM | POA: Insufficient documentation

## 2012-02-15 DIAGNOSIS — E119 Type 2 diabetes mellitus without complications: Secondary | ICD-10-CM | POA: Insufficient documentation

## 2012-02-15 DIAGNOSIS — Z87891 Personal history of nicotine dependence: Secondary | ICD-10-CM | POA: Insufficient documentation

## 2012-02-15 DIAGNOSIS — E669 Obesity, unspecified: Secondary | ICD-10-CM | POA: Insufficient documentation

## 2012-02-15 DIAGNOSIS — R110 Nausea: Secondary | ICD-10-CM | POA: Insufficient documentation

## 2012-02-15 DIAGNOSIS — Z8249 Family history of ischemic heart disease and other diseases of the circulatory system: Secondary | ICD-10-CM | POA: Insufficient documentation

## 2012-02-15 DIAGNOSIS — Z888 Allergy status to other drugs, medicaments and biological substances status: Secondary | ICD-10-CM | POA: Insufficient documentation

## 2012-02-15 DIAGNOSIS — Z803 Family history of malignant neoplasm of breast: Secondary | ICD-10-CM | POA: Insufficient documentation

## 2012-02-15 DIAGNOSIS — K219 Gastro-esophageal reflux disease without esophagitis: Secondary | ICD-10-CM | POA: Insufficient documentation

## 2012-02-15 NOTE — ED Notes (Signed)
Per [facility] boyfriend called 911 stating pt. Threatened to jump out of the window. Pt. Is sitting with [employer]. BP: 130 palpated P: 82 R: 18 O2: 94%/RA

## 2012-02-16 ENCOUNTER — Emergency Department (HOSPITAL_COMMUNITY)
Admission: EM | Admit: 2012-02-16 | Discharge: 2012-02-16 | Disposition: A | Discharge status: Home / Self Care | Payer: Medicare Other | Attending: Emergency Medicine | Admitting: Emergency Medicine

## 2012-02-16 ENCOUNTER — Encounter (HOSPITAL_COMMUNITY): Payer: Self-pay | Admitting: Family Medicine

## 2012-02-16 DIAGNOSIS — R110 Nausea: Secondary | ICD-10-CM

## 2012-02-16 LAB — URINALYSIS, ROUTINE W REFLEX MICROSCOPIC
Hgb urine dipstick: NEGATIVE
Protein, ur: NEGATIVE mg/dL
Specific Gravity, Urine: 1.025 (ref 1.005–1.030)
Urobilinogen, UA: 1 mg/dL (ref 0.0–1.0)

## 2012-02-16 LAB — RAPID URINE DRUG SCREEN, HOSP PERFORMED
Cocaine: NOT DETECTED
Opiates: NOT DETECTED
Tetrahydrocannabinol: NOT DETECTED

## 2012-02-16 LAB — CBC
HCT: 40.4 % (ref 36.0–46.0)
Hemoglobin: 13.6 g/dL (ref 12.0–15.0)
MCH: 33.7 pg (ref 26.0–34.0)
MCHC: 33.7 g/dL (ref 30.0–36.0)
MCV: 100 fL (ref 78.0–100.0)
RBC: 4.04 MIL/uL (ref 3.87–5.11)

## 2012-02-16 LAB — COMPREHENSIVE METABOLIC PANEL
ALT: 8 U/L (ref 0–35)
BUN: 12 mg/dL (ref 6–23)
CO2: 29 mEq/L (ref 19–32)
Calcium: 10.1 mg/dL (ref 8.4–10.5)
Creatinine, Ser: 0.85 mg/dL (ref 0.50–1.10)
GFR calc Af Amer: 86 mL/min — ABNORMAL LOW (ref >90)
GFR calc non Af Amer: 74 mL/min — ABNORMAL LOW (ref >90)
Glucose, Bld: 119 mg/dL — ABNORMAL HIGH (ref 70–99)
Sodium: 133 mEq/L — ABNORMAL LOW (ref 135–145)
Total Protein: 6.6 g/dL (ref 6.0–8.3)

## 2012-02-16 LAB — URINE MICROSCOPIC-ADD ON

## 2012-02-16 LAB — ACETAMINOPHEN LEVEL: Acetaminophen (Tylenol), Serum: <15 ug/mL (ref 10–30)

## 2012-02-16 MED ORDER — ONDANSETRON HCL 4 MG PO TABS: 4.0000 mg | ORAL_TABLET | Freq: Four times a day (QID) | ORAL | Status: DC

## 2012-02-16 NOTE — ED Provider Notes (Signed)
History     CSN: [id number]  Arrival date & time 02/15/12  2316   First MD Initiated Contact with Patient 02/16/12 0224      Chief Complaint  Patient presents with  . Suicidal    (Consider location/radiation/quality/duration/timing/severity/associated sxs/prior treatment) Patient is a 61 y.o. female presenting with vomiting. The history is provided by the patient.  Emesis  This is a new problem. The current episode started 6 to 12 hours ago. Episode frequency: No vomiting - she complains only of nausea. There has been no fever. Pertinent negatives include no abdominal pain and no fever. Associated symptoms comments: The patient presents with complaint of nausea. She apparently initially complained of suicidal thoughts when checking into the ED. She currently denies any thoughts of self-harm. She denies overdose, suicide attempt or current desire to harm herself. She states she only has nausea. No abdominal pain, no vomiting. No fever. No diarrhea..    Past Medical History  Diagnosis Date  . Schizoaffective disorder   . Urinary tract infection   . Hypertension   . Atrial fibrillation   . GERD (gastroesophageal reflux disease)   . Hypothyroidism   . Myocardial infarction   . Diabetes mellitus   . Obesity   . Asthma   . COPD (chronic obstructive pulmonary disease)   . Pneumonia   . CHF (congestive heart failure)   . Seizures     Past Surgical History  Procedure Date  . Tubal ligation   . Abdominal hysterectomy     Family History  Problem Relation Age of Onset  . Heart disease Father   . Cancer Mother 83    Breast    History  Substance Use Topics  . Smoking status: Former Smoker -- 1.0 packs/day for 12 years    Quit date: 06/29/2000  . Smokeless tobacco: Current User    Types: Snuff, Chew  . Alcohol Use: No    OB History    Grav Para Term Preterm Abortions TAB SAB Ect Mult Living                  Review of Systems  Constitutional: Negative for fever.    Respiratory: Negative for shortness of breath.   Cardiovascular: Negative for chest pain.  Gastrointestinal: Positive for nausea. Negative for vomiting and abdominal pain.  Genitourinary: Negative for dysuria.  Psychiatric/Behavioral:       She currently denies suicidal thoughts, denies contemplation of self-harm and denies previously feeling like she wanted to hurt herself today.    Allergies  Sulfamethoxazole w-trimethoprim and Penicillins  Home Medications   Current Outpatient Rx  Name Route Sig Dispense Refill  . ACETAMINOPHEN 325 MG PO TABS Oral Take 650 mg by mouth every 6 (six) hours as needed. For pain    . ALBUTEROL SULFATE HFA 108 (90 BASE) MCG/ACT IN AERS Inhalation Inhale 1 puff into the lungs every 6 (six) hours as needed. For wheezing    . ASPIRIN EC 81 MG PO TBEC Oral Take 81 mg by mouth daily.     . CHLORPROMAZINE HCL 50 MG PO TABS Oral Take 1-4 tablets (50-200 mg total) by mouth 3 (three) times daily. Take 1 tablet (50MG ) twice daily and 4 tablets (200MG ) at bedtime. 180 tablet 0  . DIVALPROEX SODIUM ER 500 MG PO TB24 Oral Take 2 tablets (1,000 mg total) by mouth 2 (two) times daily. 60 tablet 0  . FLUTICASONE PROPIONATE 50 MCG/ACT NA SUSP Nasal Place 2 sprays into the nose daily. For  nasal congestion    . FOLIC ACID 1 MG PO TABS Oral Take 1 mg by mouth every morning.     Marland Kitchen LEVOTHYROXINE SODIUM 150 MCG PO TABS Oral Take 150 mcg by mouth daily.    Marland Kitchen LISINOPRIL 20 MG PO TABS Oral Take 1 tablet (20 mg total) by mouth every 12 (twelve) hours. 60 tablet 0  . METOPROLOL SUCCINATE ER 50 MG PO TB24 Oral Take 50 mg by mouth daily. Take with or immediately following a meal.    . OMEPRAZOLE 20 MG PO CPDR Oral Take 20 mg by mouth daily.     Marland Kitchen PALIPERIDONE ER 6 MG PO TB24 Oral Take 1 tablet (6 mg total) by mouth 2 (two) times daily. 60 tablet 0    BP 115/83  Pulse 77  Temp 97.7 F (36.5 C) (Oral)  Resp 16  SpO2 95%  Physical Exam  Constitutional: She is oriented to person,  place, and time. She appears well-developed and well-nourished.  HENT:  Head: Normocephalic.  Neck: Normal range of motion. Neck supple.  Cardiovascular: Normal rate and regular rhythm.   No murmur heard. Pulmonary/Chest: Effort normal and breath sounds normal. She has no wheezes. She has no rales.  Abdominal: Soft. Bowel sounds are normal. There is no tenderness. There is no rebound and no guarding.  Musculoskeletal: Normal range of motion.  Neurological: She is oriented to person, place, and time.       Patient somnolent but easily awakened and is oriented while awake.  Skin: Skin is warm and dry. No rash noted.  Psychiatric: She has a normal mood and affect.    ED Course  Procedures (including critical care time)  Labs Reviewed  URINALYSIS, ROUTINE W REFLEX MICROSCOPIC - Abnormal; Notable for the following:    APPearance CLOUDY (*)     Bilirubin Urine SMALL (*)     Leukocytes, UA MODERATE (*)     All other components within normal limits  CBC - Abnormal; Notable for the following:    Platelets 130 (*)     All other components within normal limits  COMPREHENSIVE METABOLIC PANEL - Abnormal; Notable for the following:    Sodium 133 (*)     Glucose, Bld 119 (*)     Albumin 3.4 (*)     GFR calc non Af Amer 74 (*)     GFR calc Af Amer 86 (*)     All other components within normal limits  URINE MICROSCOPIC-ADD ON - Abnormal; Notable for the following:    Squamous Epithelial / LPF MANY (*)     Bacteria, UA MANY (*)     All other components within normal limits  URINE RAPID DRUG SCREEN (HOSP PERFORMED)  ACETAMINOPHEN LEVEL   No results found.   No diagnosis found.  1. Nausea without vomiting   MDM  The patient denies suicidal thoughts at this time. She complains of nausea only. Feel she can be discharged home.         Rodena Medin, PA-C 02/16/12 0302

## 2012-02-16 NOTE — ED Notes (Signed)
[employer] contacted for patient transportation back to her home.

## 2012-02-16 NOTE — ED Notes (Signed)
Pt. and belongings wanded by security 

## 2012-02-16 NOTE — ED Notes (Signed)
1 bag of belongings located in locker #4 in triage

## 2012-02-16 NOTE — ED Notes (Signed)
[facility] here to get patient  ?

## 2012-02-16 NOTE — ED Notes (Addendum)
Pt. in gown (due to unable to fit into blue scrubs) and red socks; RN Drenda Freeze notified of clothing modification

## 2012-02-16 NOTE — ED Provider Notes (Signed)
Medical screening examination/treatment/procedure(s) were performed by non-physician practitioner and as supervising physician I was immediately available for consultation/collaboration.    Edilia Ghuman D Cedrick Partain, MD 02/16/12 1621 

## 2012-02-16 NOTE — ED Notes (Signed)
Per [employer], patient's boyfriend called EMS stating that patient was threatening that she was going to jump out of the 9th floor window where she lives. Patient states she does have thoughts of harming herself and that she would jump out of the window.  States "my aunt wants money that I can't give her."

## 2012-02-22 ENCOUNTER — Encounter (HOSPITAL_COMMUNITY): Payer: Self-pay

## 2012-02-22 ENCOUNTER — Emergency Department (HOSPITAL_COMMUNITY)
Admission: EM | Admit: 2012-02-22 | Discharge: 2012-02-22 | Disposition: A | Discharge status: Home / Self Care | Payer: Medicare Other | Attending: Emergency Medicine | Admitting: Emergency Medicine

## 2012-02-22 DIAGNOSIS — R0602 Shortness of breath: Secondary | ICD-10-CM | POA: Insufficient documentation

## 2012-02-22 DIAGNOSIS — I252 Old myocardial infarction: Secondary | ICD-10-CM | POA: Insufficient documentation

## 2012-02-22 DIAGNOSIS — R079 Chest pain, unspecified: Secondary | ICD-10-CM

## 2012-02-22 DIAGNOSIS — I10 Essential (primary) hypertension: Secondary | ICD-10-CM | POA: Insufficient documentation

## 2012-02-22 DIAGNOSIS — E119 Type 2 diabetes mellitus without complications: Secondary | ICD-10-CM | POA: Insufficient documentation

## 2012-02-22 LAB — URINALYSIS, ROUTINE W REFLEX MICROSCOPIC
Glucose, UA: NEGATIVE mg/dL
Hgb urine dipstick: NEGATIVE
Ketones, ur: NEGATIVE mg/dL
Leukocytes, UA: NEGATIVE
Protein, ur: NEGATIVE mg/dL
pH: 7 (ref 5.0–8.0)

## 2012-02-22 IMAGING — CR DG CHEST 1V PORT
1 series · 1 of 1 positions shown · non-contrast
Comparison: Portable exam 9493 hours compared to 10/17/2010

CLINICAL DATA: Right side chest pain, cough, vomiting, history
asthma, atrial fibrillation, hypertension, diabetes, morbid obesity

PORTABLE CHEST - 1 VIEW

[view not recorded]
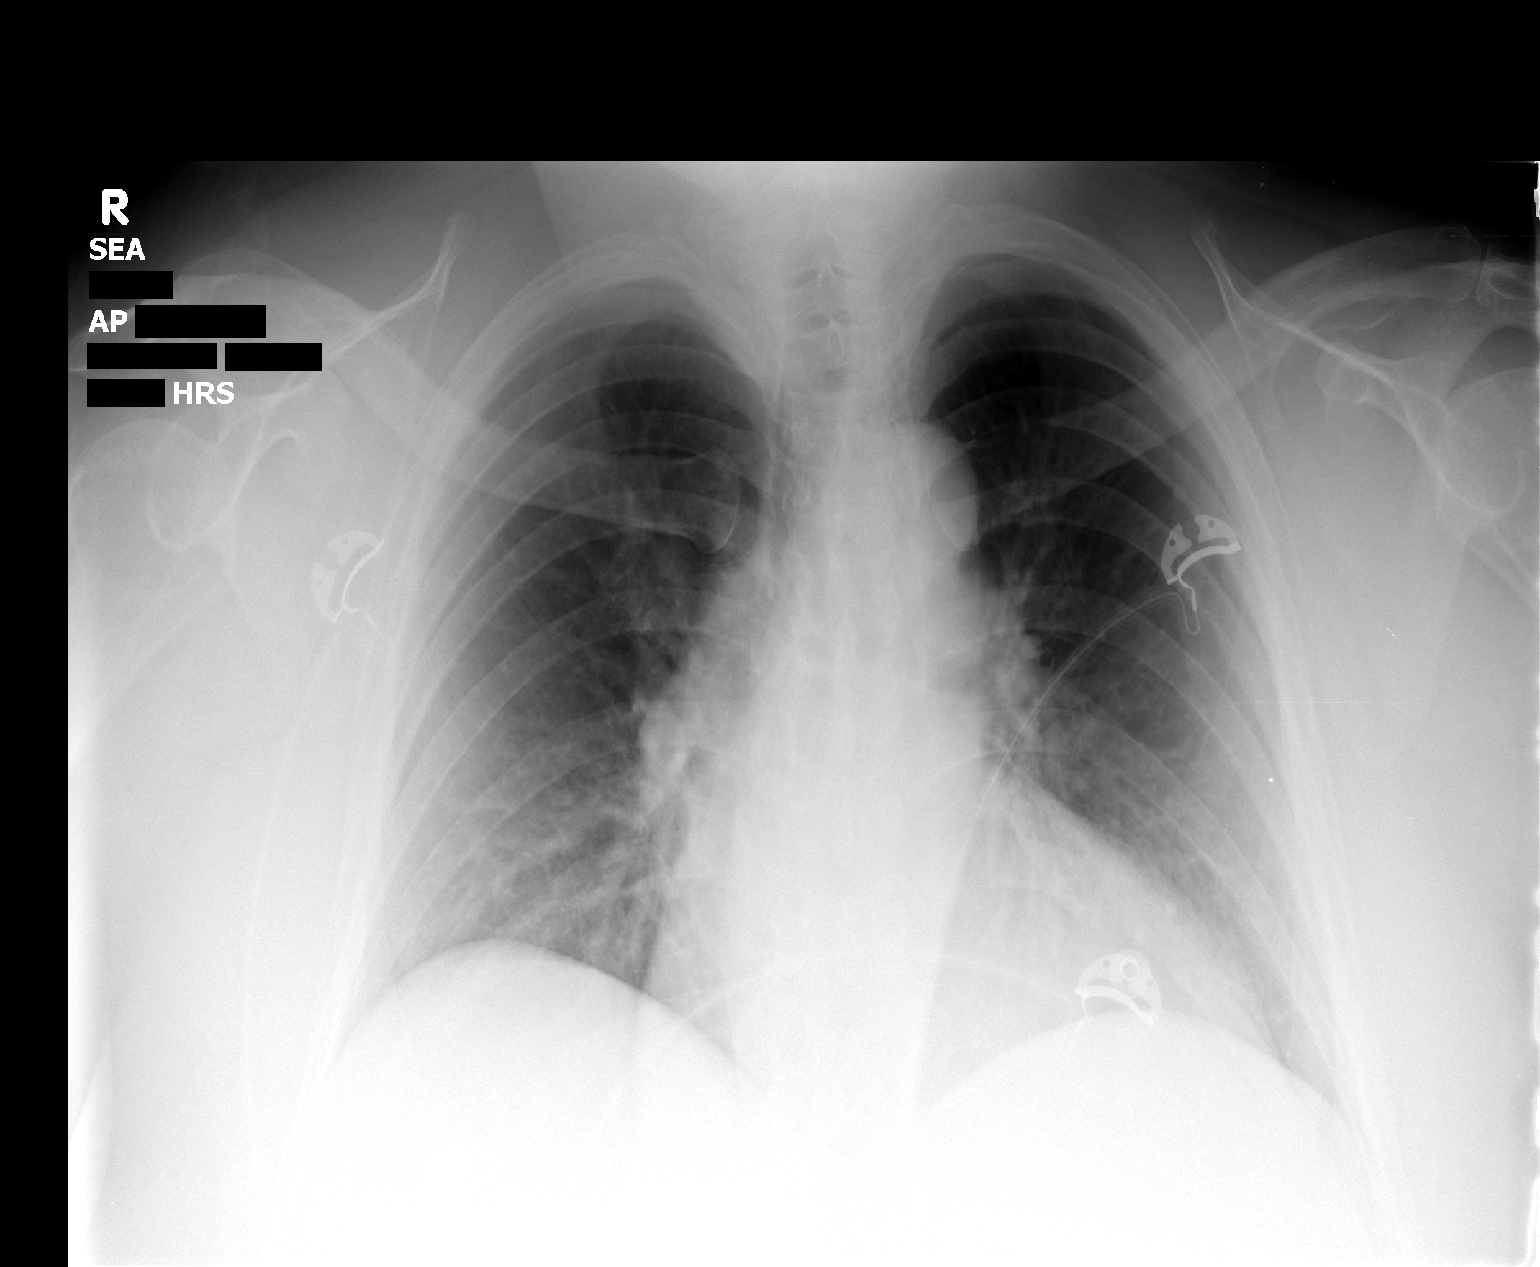

[1 of 1 positions shown; findings below may reference images not displayed]

FINDINGS: Borderline enlargement of cardiac silhouette.
Mediastinal contours and pulmonary vascularity normal.
Lungs clear.
No pleural effusion or pneumothorax.
Osseous structures unremarkable.
IMPRESSION: No acute abnormalities.
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
## 2012-02-22 NOTE — ED Notes (Signed)
When pt falls asleep stats will drop to upper 80's, pt has audible snoring.

## 2012-02-22 NOTE — ED Notes (Signed)
Spoke with pt's family member on phone and confirmed that they will be at pt's residence to let her in

## 2012-02-26 ENCOUNTER — Encounter (HOSPITAL_COMMUNITY): Payer: Self-pay | Admitting: *Deleted

## 2012-02-26 ENCOUNTER — Emergency Department (HOSPITAL_COMMUNITY)
Admission: EM | Admit: 2012-02-26 | Discharge: 2012-02-26 | Disposition: A | Discharge status: Home / Self Care | Payer: Medicare Other | Attending: Emergency Medicine | Admitting: Emergency Medicine

## 2012-02-26 DIAGNOSIS — I252 Old myocardial infarction: Secondary | ICD-10-CM | POA: Insufficient documentation

## 2012-02-26 DIAGNOSIS — R079 Chest pain, unspecified: Secondary | ICD-10-CM | POA: Insufficient documentation

## 2012-02-26 DIAGNOSIS — M791 Myalgia, unspecified site: Secondary | ICD-10-CM

## 2012-02-26 DIAGNOSIS — R51 Headache: Secondary | ICD-10-CM

## 2012-02-26 DIAGNOSIS — Z8659 Personal history of other mental and behavioral disorders: Secondary | ICD-10-CM | POA: Insufficient documentation

## 2012-02-26 DIAGNOSIS — IMO0001 Reserved for inherently not codable concepts without codable children: Secondary | ICD-10-CM | POA: Insufficient documentation

## 2012-02-26 DIAGNOSIS — G8929 Other chronic pain: Secondary | ICD-10-CM | POA: Insufficient documentation

## 2012-02-26 DIAGNOSIS — I4891 Unspecified atrial fibrillation: Secondary | ICD-10-CM | POA: Insufficient documentation

## 2012-02-26 DIAGNOSIS — F17200 Nicotine dependence, unspecified, uncomplicated: Secondary | ICD-10-CM | POA: Insufficient documentation

## 2012-02-26 DIAGNOSIS — E119 Type 2 diabetes mellitus without complications: Secondary | ICD-10-CM | POA: Insufficient documentation

## 2012-02-26 DIAGNOSIS — J449 Chronic obstructive pulmonary disease, unspecified: Secondary | ICD-10-CM | POA: Insufficient documentation

## 2012-02-26 DIAGNOSIS — J4489 Other specified chronic obstructive pulmonary disease: Secondary | ICD-10-CM | POA: Insufficient documentation

## 2012-02-26 DIAGNOSIS — I10 Essential (primary) hypertension: Secondary | ICD-10-CM | POA: Insufficient documentation

## 2012-02-26 HISTORY — DX: Morbid (severe) obesity due to excess calories: E66.01

## 2012-02-26 HISTORY — DX: Other chronic pain: G89.29

## 2012-02-26 LAB — POCT I-STAT, CHEM 8
BUN: 20 mg/dL (ref 6–23)
Calcium, Ion: 1.35 mmol/L — ABNORMAL HIGH (ref 1.12–1.23)
Chloride: 95 mEq/L — ABNORMAL LOW (ref 96–112)
Creatinine, Ser: 1.2 mg/dL — ABNORMAL HIGH (ref 0.50–1.10)

## 2012-02-26 LAB — POCT I-STAT TROPONIN I: Troponin i, poc: 0 ng/mL (ref 0.00–0.08)

## 2012-02-26 MED ORDER — KETOROLAC TROMETHAMINE 60 MG/2ML IM SOLN: 60.0000 mg | Freq: Once | INTRAMUSCULAR | Status: DC

## 2012-02-26 NOTE — ED Notes (Signed)
[facility] called  

## 2012-02-26 NOTE — ED Notes (Signed)
C/o pain to all extremities, h/a & CP for a long time. Pt A&O&4. NAD. Seen in ED 02-22-12 for same.

## 2012-02-26 NOTE — ED Notes (Signed)
PT ambulated with baseline gait; VSS; A&Ox3; no signs of distress; respirations even and unlabored; skin warm and dry; no questions upon discharge.  

## 2012-02-26 NOTE — ED Provider Notes (Signed)
History     CSN: [id number]  Arrival date & time 02/26/12  1719   First MD Initiated Contact with Patient 02/26/12 1757      Chief Complaint  Patient presents with  . Muscle Pain  . Headache  . Chest Pain    (Consider location/radiation/quality/duration/timing/severity/associated sxs/prior treatment) HPI Comments: 61 year old female with a history of hypertension, A Fib, and MI presents to the emergency department complaining of sudden onset chest pain since 3:00 this afternoon. She was seen in the emergency department on September 4 for chest pain, however she states that pain was different because it was on the right side whereas today it is on the left side "where her heart is". States she has been sweaty, dizzy, nauseous, and aching. Denies vomiting. Throughout the conversation, patient's voice has changed multiple times and the location of her pain has also changed. At one point she states it is all over her chest, and then she states it is only on the right and then it is only in the left. She is requesting pain medicine. She believes she belongs in the hospital. She is unsure if she has a stent placement. She also says she has a heart doctor, but is unsure of his name.  Patient is a 61 y.o. female presenting with musculoskeletal pain, headaches, and chest pain. The history is provided by the patient. The history is limited by the condition of the patient.  Muscle Pain Associated symptoms include chest pain, diaphoresis, headaches, myalgias and nausea. Pertinent negatives include no congestion, coughing or rash.  Headache  Associated symptoms include shortness of breath and nausea.  Chest Pain Primary symptoms include shortness of breath, nausea and dizziness. Pertinent negatives for primary symptoms include no cough.  Dizziness also occurs with nausea and diaphoresis.   Associated symptoms include diaphoresis.     Past Medical History  Diagnosis Date  . Schizoaffective  disorder   . Urinary tract infection   . Hypertension   . Atrial fibrillation   . GERD (gastroesophageal reflux disease)   . Hypothyroidism   . Myocardial infarction   . Diabetes mellitus   . Obesity   . Asthma   . COPD (chronic obstructive pulmonary disease)   . Pneumonia   . CHF (congestive heart failure)   . Seizures   . Morbid obesity   . Chronic pain     Past Surgical History  Procedure Date  . Tubal ligation   . Abdominal hysterectomy     Family History  Problem Relation Age of Onset  . Heart disease Father   . Cancer Mother 85    Breast    History  Substance Use Topics  . Smoking status: Current Everyday Smoker -- 1.0 packs/day for 12 years    Last Attempt to Quit: 06/29/2000  . Smokeless tobacco: Current User    Types: Snuff, Chew  . Alcohol Use: No    OB History    Grav Para Term Preterm Abortions TAB SAB Ect Mult Living                  Review of Systems  Constitutional: Positive for diaphoresis. Negative for appetite change.  HENT: Negative for congestion.   Respiratory: Positive for shortness of breath. Negative for cough and chest tightness.   Cardiovascular: Positive for chest pain.  Gastrointestinal: Positive for nausea.  Musculoskeletal: Positive for myalgias.  Skin: Negative for rash.  Neurological: Positive for dizziness, light-headedness and headaches.  Psychiatric/Behavioral: Negative for hallucinations. The patient is  nervous/anxious.     Allergies  Sulfamethoxazole w-trimethoprim and Penicillins  Home Medications   Current Outpatient Rx  Name Route Sig Dispense Refill  . ACETAMINOPHEN 325 MG PO TABS Oral Take 650 mg by mouth every 6 (six) hours as needed. For pain    . ALBUTEROL SULFATE HFA 108 (90 BASE) MCG/ACT IN AERS Inhalation Inhale 1 puff into the lungs every 6 (six) hours as needed. For wheezing    . ASPIRIN EC 81 MG PO TBEC Oral Take 81 mg by mouth daily.     . CHLORPROMAZINE HCL 50 MG PO TABS Oral Take 1-4 tablets  (50-200 mg total) by mouth 3 (three) times daily. Take 1 tablet (50MG ) twice daily and 4 tablets (200MG ) at bedtime. 180 tablet 0  . DIVALPROEX SODIUM ER 500 MG PO TB24 Oral Take 2 tablets (1,000 mg total) by mouth 2 (two) times daily. 60 tablet 0  . FLUTICASONE PROPIONATE 50 MCG/ACT NA SUSP Nasal Place 2 sprays into the nose daily. For nasal congestion    . FOLIC ACID 1 MG PO TABS Oral Take 1 mg by mouth every morning.     Marland Kitchen LEVOTHYROXINE SODIUM 150 MCG PO TABS Oral Take 150 mcg by mouth daily.    Marland Kitchen LISINOPRIL 20 MG PO TABS Oral Take 1 tablet (20 mg total) by mouth every 12 (twelve) hours. 60 tablet 0  . METOPROLOL SUCCINATE ER 50 MG PO TB24 Oral Take 50 mg by mouth daily. Take with or immediately following a meal.    . OMEPRAZOLE 20 MG PO CPDR Oral Take 20 mg by mouth daily.     Marland Kitchen PALIPERIDONE ER 6 MG PO TB24 Oral Take 1 tablet (6 mg total) by mouth 2 (two) times daily. 60 tablet 0    BP 128/75  Pulse 83  Temp 98.6 F (37 C) (Oral)  Resp 13  SpO2 93%  Physical Exam  Nursing note and vitals reviewed. Constitutional: She does not have a sickly appearance. No distress. Nasal cannula in place.       Morbidly obese  HENT:  Head: Normocephalic and atraumatic.  Mouth/Throat: Oropharynx is clear and moist.  Eyes: Conjunctivae are normal.  Neck: Normal range of motion. Neck supple. No JVD present.  Cardiovascular: Normal rate, regular rhythm, normal heart sounds and intact distal pulses.   Pulmonary/Chest: Effort normal and breath sounds normal. She has no wheezes. She has no rhonchi. She has no rales.  Psychiatric: Her mood appears anxious. Her speech is tangential. She is slowed and withdrawn.       Changes voice tone each time a different person enters the room to talk to her and upon each encounter    ED Course  Procedures (including critical care time)  Labs Reviewed - No data to display No results found. Results for orders placed during the hospital encounter of 02/26/12  POCT  I-STAT, CHEM 8      Component Value Range   Sodium 135  135 - 145 mEq/L   Potassium 4.5  3.5 - 5.1 mEq/L   Chloride 95 (*) 96 - 112 mEq/L   BUN 20  6 - 23 mg/dL   Creatinine, Ser 4.09 (*) 0.50 - 1.10 mg/dL   Glucose, Bld 84  70 - 99 mg/dL   Calcium, Ion 8.11 (*) 1.12 - 1.23 mmol/L   TCO2 37  0 - 100 mmol/L   Hemoglobin 13.9  12.0 - 15.0 g/dL   HCT 91.4  78.2 - 95.6 %  POCT I-STAT  TROPONIN I      Component Value Range   Troponin i, poc 0.00  0.00 - 0.08 ng/mL   Comment 3              Date: 02/26/2012  Rate: 87  Rhythm: normal sinus rhythm  QRS Axis: normal  Intervals: normal  ST/T Wave abnormalities: nonspecific T wave changes  Conduction Disutrbances:none  Narrative Interpretation: no stemi  Old EKG Reviewed: unchanged    1. Chest pain   2. Headache   3. Myalgia       MDM  61 year old female with schizoaffective disorder presenting with chest pain since 3:00 this afternoon. Her story continues to change while interviewing her. She is requesting pain medication. Unable to find any cardiology notes to see what her MRI was. Her EKG has noticed changes since the fourth of this month. She has no STEMI and no significant findings. No evidence of previous infarct. She has had multiple chest x-rays over the past few months. Will obtain troponin and an i-STAT chem-8. I do not feel a chest x-ray is needed at this time. Case discussed with Dr. Radford Pax who also agrees no further cardiac workup necessary at this time. 7:25 PM Troponin negative. Kami showing mildly elevated creatinine. Discussed this with patient and told her to bring it up to her PCP. Discussed with Dr. Radford Pax and we both feel patient is stable for discharge without any further cardiac workup.    Trevor Mace, PA-C 02/26/12 1929

## 2012-02-26 NOTE — ED Notes (Signed)
PA at bedside.

## 2012-02-26 NOTE — ED Notes (Addendum)
C/o left side CP onset 1530 while sitting on couch, +SOB. Describes CP as "spasms". States always has pain "everywhere but the left side CP is new". Reports took ASA 324mg  & NTG x1 prior to EMS arrival with no change in pain.  Pt resp e/u, no distress. No increase in pain with palpation or deep breaths

## 2012-02-27 ENCOUNTER — Emergency Department (HOSPITAL_COMMUNITY)
Admission: EM | Admit: 2012-02-27 | Discharge: 2012-02-27 | Discharge status: Against Medical Advice | Payer: Medicare Other | Attending: Emergency Medicine | Admitting: Emergency Medicine

## 2012-02-27 DIAGNOSIS — Z0389 Encounter for observation for other suspected diseases and conditions ruled out: Secondary | ICD-10-CM | POA: Insufficient documentation

## 2012-02-27 NOTE — ED Provider Notes (Signed)
Medical screening examination/treatment/procedure(s) were performed by non-physician practitioner and as supervising physician I was immediately available for consultation/collaboration.    Karley Pho L Lyncoln Ledgerwood, MD 02/27/12 1956 

## 2012-02-27 NOTE — ED Provider Notes (Signed)
Medical screening examination/treatment/procedure(s) were performed by non-physician practitioner and as supervising physician I was immediately available for consultation/collaboration.  Cheri Guppy, MD 02/27/12 [phone]

## 2012-02-29 ENCOUNTER — Emergency Department (HOSPITAL_COMMUNITY): Payer: Medicare Other

## 2012-02-29 ENCOUNTER — Encounter (HOSPITAL_COMMUNITY): Payer: Self-pay | Admitting: *Deleted

## 2012-02-29 ENCOUNTER — Emergency Department (HOSPITAL_COMMUNITY)
Admission: EM | Admit: 2012-02-29 | Discharge: 2012-02-29 | Disposition: A | Discharge status: Home / Self Care | Payer: Medicare Other | Attending: Emergency Medicine | Admitting: Emergency Medicine

## 2012-02-29 DIAGNOSIS — F319 Bipolar disorder, unspecified: Secondary | ICD-10-CM | POA: Insufficient documentation

## 2012-02-29 DIAGNOSIS — Z79899 Other long term (current) drug therapy: Secondary | ICD-10-CM | POA: Insufficient documentation

## 2012-02-29 DIAGNOSIS — R079 Chest pain, unspecified: Secondary | ICD-10-CM

## 2012-02-29 DIAGNOSIS — I2510 Atherosclerotic heart disease of native coronary artery without angina pectoris: Secondary | ICD-10-CM | POA: Insufficient documentation

## 2012-02-29 DIAGNOSIS — I509 Heart failure, unspecified: Secondary | ICD-10-CM | POA: Insufficient documentation

## 2012-02-29 DIAGNOSIS — J4489 Other specified chronic obstructive pulmonary disease: Secondary | ICD-10-CM | POA: Insufficient documentation

## 2012-02-29 DIAGNOSIS — E119 Type 2 diabetes mellitus without complications: Secondary | ICD-10-CM | POA: Insufficient documentation

## 2012-02-29 DIAGNOSIS — J449 Chronic obstructive pulmonary disease, unspecified: Secondary | ICD-10-CM | POA: Insufficient documentation

## 2012-02-29 DIAGNOSIS — I10 Essential (primary) hypertension: Secondary | ICD-10-CM | POA: Insufficient documentation

## 2012-02-29 DIAGNOSIS — Z8659 Personal history of other mental and behavioral disorders: Secondary | ICD-10-CM | POA: Insufficient documentation

## 2012-02-29 DIAGNOSIS — I252 Old myocardial infarction: Secondary | ICD-10-CM | POA: Insufficient documentation

## 2012-02-29 DIAGNOSIS — F17200 Nicotine dependence, unspecified, uncomplicated: Secondary | ICD-10-CM | POA: Insufficient documentation

## 2012-02-29 LAB — POCT I-STAT TROPONIN I
Troponin i, poc: 0 ng/mL (ref 0.00–0.08)
Troponin i, poc: 0 ng/mL (ref 0.00–0.08)

## 2012-02-29 LAB — COMPREHENSIVE METABOLIC PANEL
ALT: 27 U/L (ref 0–35)
AST: 32 U/L (ref 0–37)
Albumin: 2.7 g/dL — ABNORMAL LOW (ref 3.5–5.2)
Calcium: 9.6 mg/dL (ref 8.4–10.5)
Potassium: 4.3 mEq/L (ref 3.5–5.1)
Sodium: 138 mEq/L (ref 135–145)
Total Protein: 5.8 g/dL — ABNORMAL LOW (ref 6.0–8.3)

## 2012-02-29 LAB — CBC
MCH: 33.1 pg (ref 26.0–34.0)
MCHC: 32 g/dL (ref 30.0–36.0)
Platelets: 78 10*3/uL — ABNORMAL LOW (ref 150–400)

## 2012-02-29 LAB — PROTIME-INR
INR: 1 (ref 0.00–1.49)
Prothrombin Time: 13.4 seconds (ref 11.6–15.2)

## 2012-02-29 MED ORDER — ASPIRIN 81 MG PO CHEW: 324.0000 mg | CHEWABLE_TABLET | Freq: Once | ORAL | Status: AC

## 2012-02-29 MED ORDER — NITROGLYCERIN 0.4 MG SL SUBL: 0.4000 mg | SUBLINGUAL_TABLET | SUBLINGUAL | Status: DC | PRN

## 2012-02-29 MED ORDER — SODIUM CHLORIDE 0.9 % IV SOLN
20.0000 mL | INTRAVENOUS | Status: DC
  Administered 2012-02-29: 20 mL via INTRAVENOUS

## 2012-02-29 NOTE — ED Provider Notes (Signed)
History     CSN: [id number]  Arrival date & time 02/29/12  1307   First MD Initiated Contact with Patient 02/29/12 1309      Chief Complaint  Patient presents with  . Chest Pain    (Consider location/radiation/quality/duration/timing/severity/associated sxs/prior treatment) HPI  61 y.o. female INAD with PMH significant for MI, CHF, mild MR, schizoaffective and bipolar d/o c/o substernal chest pain at waking this AM. Pain is described as 10/10 non radiating. Exertional, positional and pleuritic. Pt reports SOB and 3x episodes of NBNB emisis today.   RF: Active smoker with 12 pack year history, HTN, CAD/MI  Cardiologist: Dr. Azzie Almas Cath in 2007 showed no obstruction, nuclear stress test negative x2     Past Medical History  Diagnosis Date  . Schizoaffective disorder   . Urinary tract infection   . Hypertension   . Atrial fibrillation   . GERD (gastroesophageal reflux disease)   . Hypothyroidism   . Myocardial infarction   . Diabetes mellitus   . Obesity   . Asthma   . COPD (chronic obstructive pulmonary disease)   . Pneumonia   . CHF (congestive heart failure)   . Seizures   . Morbid obesity   . Chronic pain     Past Surgical History  Procedure Date  . Tubal ligation   . Abdominal hysterectomy     Family History  Problem Relation Age of Onset  . Heart disease Father   . Cancer Mother 66    Breast    History  Substance Use Topics  . Smoking status: Current Every Day Smoker -- 1.0 packs/day for 12 years    Last Attempt to Quit: 06/29/2000  . Smokeless tobacco: Current User    Types: Snuff, Chew  . Alcohol Use: No    OB History    Grav Para Term Preterm Abortions TAB SAB Ect Mult Living                  Review of Systems  Constitutional: Negative for fever.  Respiratory: Positive for shortness of breath.   Cardiovascular: Positive for chest pain.  Gastrointestinal: Positive for nausea and vomiting. Negative for abdominal pain and diarrhea.    All other systems reviewed and are negative.    Allergies  Sulfamethoxazole w-trimethoprim and Penicillins  Home Medications   Current Outpatient Rx  Name Route Sig Dispense Refill  . ACETAMINOPHEN 325 MG PO TABS Oral Take 650 mg by mouth every 6 (six) hours as needed. For pain    . ALBUTEROL SULFATE HFA 108 (90 BASE) MCG/ACT IN AERS Inhalation Inhale 1 puff into the lungs every 6 (six) hours as needed. For wheezing    . ASPIRIN EC 81 MG PO TBEC Oral Take 81 mg by mouth daily.     . CHLORPROMAZINE HCL 50 MG PO TABS Oral Take 50-200 mg by mouth 3 (three) times daily. Take 50 mg twice daily then take 200 mg at bedtime    . DIVALPROEX SODIUM ER 500 MG PO TB24 Oral Take 2 tablets (1,000 mg total) by mouth 2 (two) times daily. 60 tablet 0  . FLUTICASONE PROPIONATE 50 MCG/ACT NA SUSP Nasal Place 2 sprays into the nose daily. For nasal congestion    . FOLIC ACID 1 MG PO TABS Oral Take 1 mg by mouth every morning.     Marland Kitchen LEVOTHYROXINE SODIUM 150 MCG PO TABS Oral Take 150 mcg by mouth daily.    Marland Kitchen LISINOPRIL 20 MG PO TABS Oral Take 1  tablet (20 mg total) by mouth every 12 (twelve) hours. 60 tablet 0  . METOPROLOL SUCCINATE ER 50 MG PO TB24 Oral Take 50 mg by mouth daily. Take with or immediately following a meal.    . OMEPRAZOLE 20 MG PO CPDR Oral Take 20 mg by mouth daily.     Marland Kitchen PALIPERIDONE ER 6 MG PO TB24 Oral Take 1 tablet (6 mg total) by mouth 2 (two) times daily. 60 tablet 0    BP 109/76  Pulse 74  Temp 97.6 F (36.4 C) (Oral)  Resp 18  SpO2 93%  Physical Exam  Nursing note and vitals reviewed. Constitutional: She is oriented to person, place, and time. She appears well-developed and well-nourished. No distress.       Morbidly obese  HENT:  Head: Normocephalic and atraumatic.  Right Ear: External ear normal.  Mouth/Throat: Oropharynx is clear and moist.  Eyes: Conjunctivae normal and EOM are normal. Pupils are equal, round, and reactive to light.  Neck: Normal range of motion.   Cardiovascular: Normal rate, regular rhythm, normal heart sounds and intact distal pulses.   Pulmonary/Chest: Effort normal and breath sounds normal. No stridor. No respiratory distress. She has no wheezes. She has no rales. She exhibits no tenderness.  Abdominal: Soft. Bowel sounds are normal. She exhibits no distension and no mass. There is no tenderness. There is no rebound and no guarding.  Musculoskeletal: Normal range of motion.  Neurological: She is alert and oriented to person, place, and time.  Psychiatric: She has a normal mood and affect.    ED Course  Procedures (including critical care time)  Labs Reviewed  CBC - Abnormal; Notable for the following:    RBC 3.78 (*)     MCV 103.4 (*)     Platelets 78 (*)  PLATELET COUNT CONFIRMED BY SMEAR   All other components within normal limits  COMPREHENSIVE METABOLIC PANEL - Abnormal; Notable for the following:    CO2 35 (*)     Total Protein 5.8 (*)     Albumin 2.7 (*)     Total Bilirubin 0.2 (*)     GFR calc non Af Amer 74 (*)     GFR calc Af Amer 86 (*)     All other components within normal limits  MAGNESIUM  APTT  PROTIME-INR  POCT I-STAT TROPONIN I   Dg Chest Portable 1 View  02/29/2012  *RADIOLOGY REPORT*  Clinical Data: Chest pain, shortness of breath.  PORTABLE CHEST - 1 VIEW  Comparison: 02/14/2012  Findings: Cardiomegaly.  Slight interstitial prominence could reflect early interstitial edema, stable since prior study.  No confluent opacities or effusions.  No acute bony abnormality.  IMPRESSION: Stable slight interstitial prominence and cardiomegaly.   Original Report Authenticated By: Cyndie Chime, M.D.     Date: 02/29/2012  Rate: 71  Rhythm: normal sinus rhythm  QRS Axis: normal  Intervals: normal  ST/T Wave abnormalities: normal and nonspecific T wave changes  Conduction Disutrbances:none  Narrative Interpretation:   Old EKG Reviewed: unchanged  1. Chest pain      MDM  61 y/o female with chest pain  onset this AM. Pt had been seen for simlar multiple times, She had a negtive cath in 2007. EKG shows no changes, 1st troponin is negative.   Consult from Cardiologist Dr. Sharyn Lull appreciated: He has advised if there are no EKG changes and 2 sets of enzymes are negative, she can be d/c'd and he will see her in the  office tomorrow.   Report given to PA Muthersbaugh at shift change.       Wynetta Emery, PA-C 02/29/12 1543

## 2012-02-29 NOTE — ED Provider Notes (Signed)
Patient care asumed from [facility], [facility].Marland Kitchen Patient resting comfortably at present without chest pain. Lungs CTA bilaterally. S1/S2, RRR, no murmur. Abdomen soft, bowel sounds present. Strong distal pulses palpated all extremities. Sinus rhythm on monitor without ectopy. Troponin negative x 1.  Awaiting results of second troponin. Patient states she wants to be admitted over there is no indication of need for admission at this time.  If the patient'a repeat troponin is negative she may be discharged home with follow up at [facility] office tomorrow.    Pt repeat troponin is negative.   Will discharge pt home with F/U appointment.      Dahlia Client Heide Brossart, PA-C 02/29/12 1741

## 2012-02-29 NOTE — ED Provider Notes (Signed)
Medical screening examination/treatment/procedure(s) were conducted as a shared visit with non-physician practitioner(s) and myself.  I personally evaluated the patient during the encounter  Hx complicated by schizoaffective disorder, presenting with chest pain, SOB, nausea constant since this morning.  Frequent visits for same.  EKG unchanged.  Troponin negative.  Clean cath 2007.  Glynn Octave, MD 02/29/12 [phone]

## 2012-02-29 NOTE — ED Notes (Signed)
Pt states that she was able to walk until " a couple days ago"

## 2012-02-29 NOTE — ED Notes (Addendum)
Per EMS- pt began having non radiating left sided chest pain approx 8am today. Pt also reports bilateral leg pain. VSS with EMS. Pt recievd 324 of asprin and 2 nitro with no relief. Last nitro at 1250

## 2012-03-02 IMAGING — CR DG CHEST 2V
2 series · 2 of 2 positions shown · non-contrast
Comparison: 10/25/2010

CLINICAL DATA: Chest pain and short of breath

CHEST - 2 VIEW

[w chest lat *]
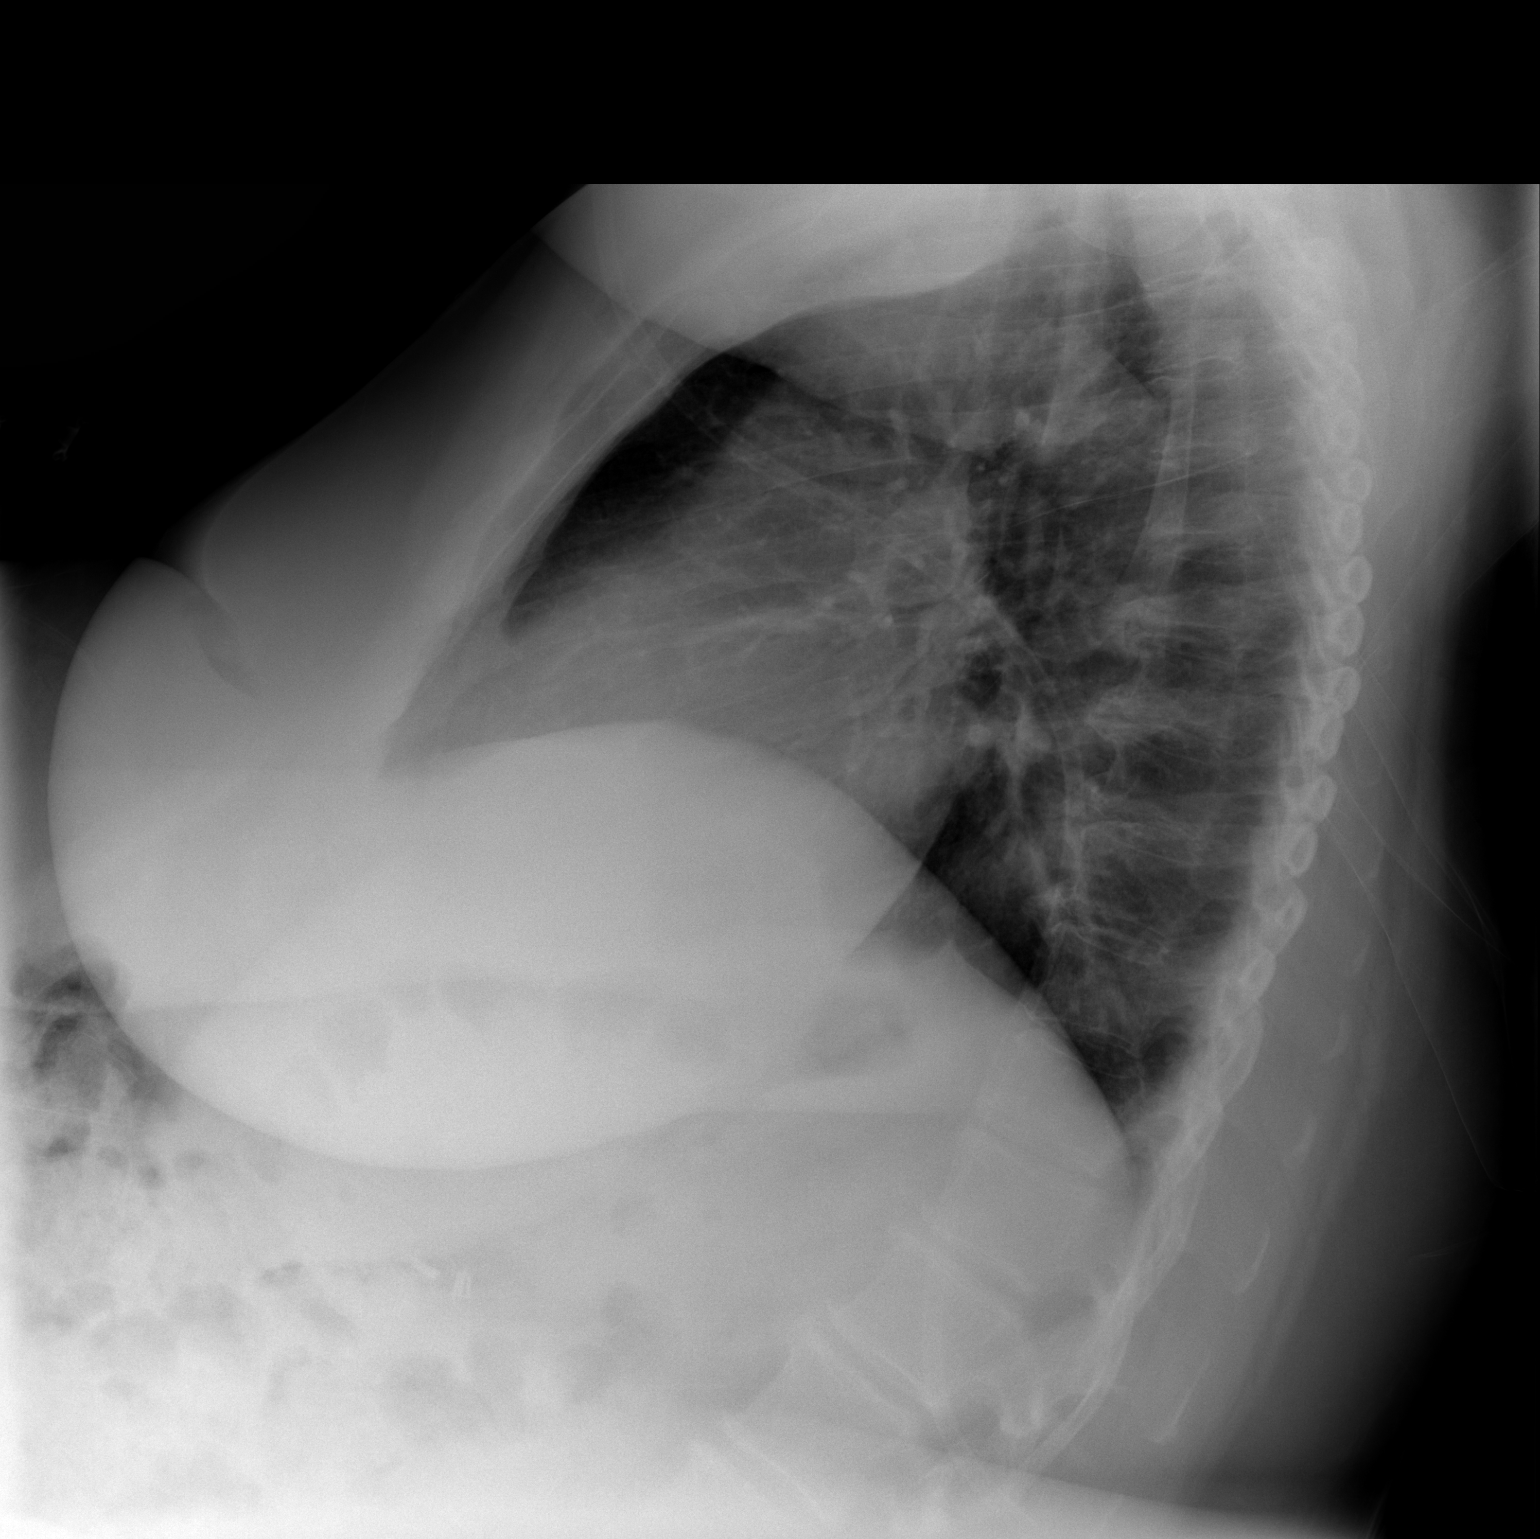

[w chest pa *]
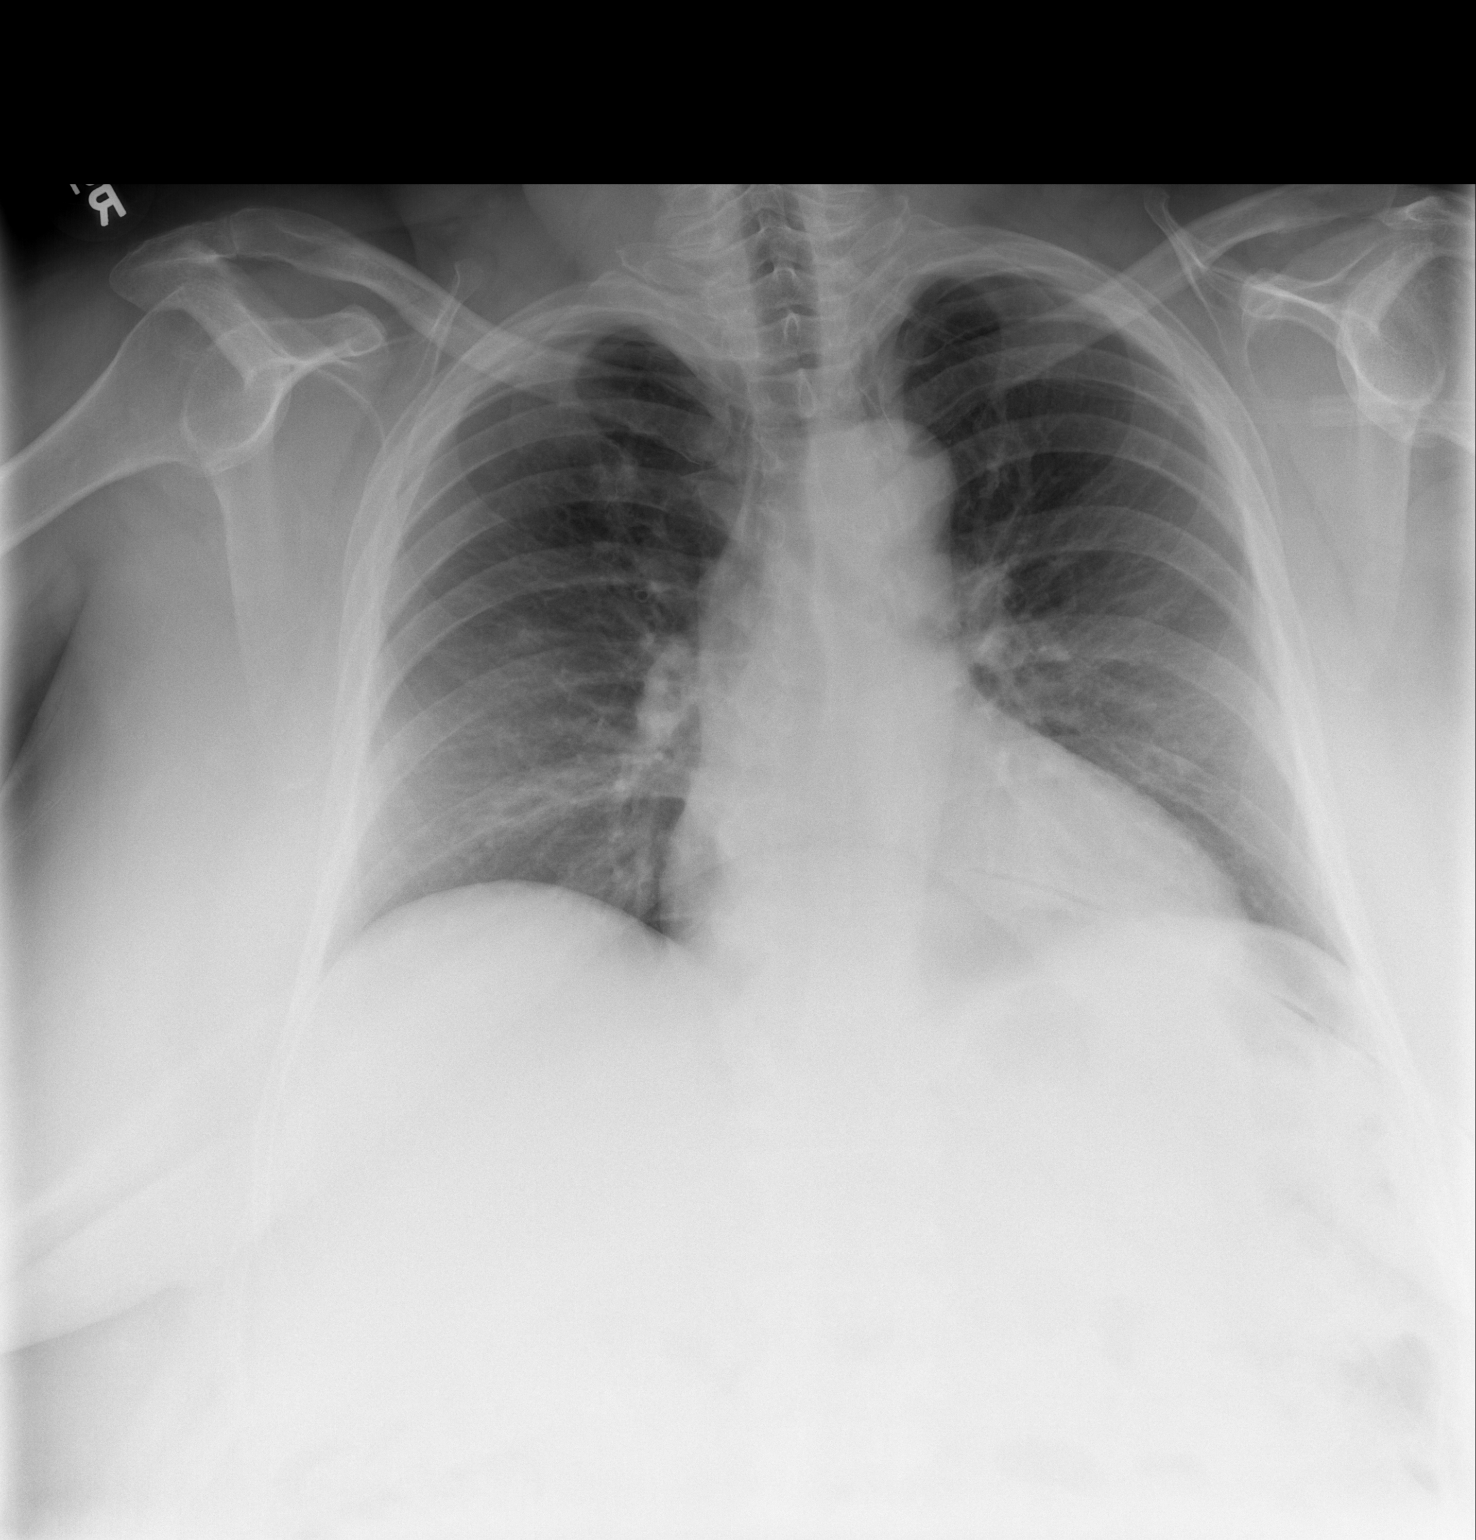

[2 of 2 positions shown; findings below may reference images not displayed]

FINDINGS: Heart size is mildly enlarged.  Negative for heart
failure.  Mild bibasilar atelectasis without pneumonia or effusion
IMPRESSION: Mild bibasilar atelectasis.

## 2012-03-03 NOTE — ED Provider Notes (Signed)
Medical screening examination/treatment/procedure(s) were performed by non-physician practitioner and as supervising physician I was immediately available for consultation/collaboration.  John-Adam Ameyah Bangura, M.D.     John-Adam Havish Petties, MD 03/03/12 1634 

## 2012-03-04 ENCOUNTER — Encounter (HOSPITAL_COMMUNITY): Payer: Self-pay | Admitting: *Deleted

## 2012-03-04 ENCOUNTER — Emergency Department (HOSPITAL_COMMUNITY)
Admission: EM | Admit: 2012-03-04 | Discharge: 2012-03-04 | Disposition: A | Discharge status: Home / Self Care | Payer: Medicare Other | Attending: Emergency Medicine | Admitting: Emergency Medicine

## 2012-03-04 DIAGNOSIS — R609 Edema, unspecified: Secondary | ICD-10-CM | POA: Insufficient documentation

## 2012-03-04 DIAGNOSIS — J4489 Other specified chronic obstructive pulmonary disease: Secondary | ICD-10-CM | POA: Insufficient documentation

## 2012-03-04 DIAGNOSIS — I252 Old myocardial infarction: Secondary | ICD-10-CM | POA: Insufficient documentation

## 2012-03-04 DIAGNOSIS — E119 Type 2 diabetes mellitus without complications: Secondary | ICD-10-CM | POA: Insufficient documentation

## 2012-03-04 DIAGNOSIS — G8929 Other chronic pain: Secondary | ICD-10-CM | POA: Insufficient documentation

## 2012-03-04 DIAGNOSIS — I509 Heart failure, unspecified: Secondary | ICD-10-CM | POA: Insufficient documentation

## 2012-03-04 DIAGNOSIS — E039 Hypothyroidism, unspecified: Secondary | ICD-10-CM | POA: Insufficient documentation

## 2012-03-04 DIAGNOSIS — K219 Gastro-esophageal reflux disease without esophagitis: Secondary | ICD-10-CM | POA: Insufficient documentation

## 2012-03-04 DIAGNOSIS — IMO0001 Reserved for inherently not codable concepts without codable children: Secondary | ICD-10-CM | POA: Insufficient documentation

## 2012-03-04 DIAGNOSIS — I10 Essential (primary) hypertension: Secondary | ICD-10-CM | POA: Insufficient documentation

## 2012-03-04 DIAGNOSIS — M791 Myalgia, unspecified site: Secondary | ICD-10-CM

## 2012-03-04 DIAGNOSIS — J449 Chronic obstructive pulmonary disease, unspecified: Secondary | ICD-10-CM | POA: Insufficient documentation

## 2012-03-04 DIAGNOSIS — I4891 Unspecified atrial fibrillation: Secondary | ICD-10-CM | POA: Insufficient documentation

## 2012-03-04 DIAGNOSIS — F259 Schizoaffective disorder, unspecified: Secondary | ICD-10-CM | POA: Insufficient documentation

## 2012-03-04 LAB — BASIC METABOLIC PANEL
BUN: 8 mg/dL (ref 6–23)
Calcium: 9.8 mg/dL (ref 8.4–10.5)
Chloride: 100 mEq/L (ref 96–112)
Creatinine, Ser: 0.86 mg/dL (ref 0.50–1.10)
GFR calc Af Amer: 85 mL/min — ABNORMAL LOW (ref >90)
GFR calc non Af Amer: 73 mL/min — ABNORMAL LOW (ref >90)

## 2012-03-04 LAB — CK TOTAL AND CKMB (NOT AT ARMC): Relative Index: INVALID (ref 0.0–2.5)

## 2012-03-04 MED ORDER — FUROSEMIDE 20 MG PO TABS
20.0000 mg | ORAL_TABLET | Freq: Once | ORAL | Status: AC
  Administered 2012-03-04: 20 mg via ORAL
  Filled 2012-03-04: qty 1

## 2012-03-04 MED ORDER — TRAMADOL HCL 50 MG PO TABS: 50.0000 mg | ORAL_TABLET | Freq: Four times a day (QID) | ORAL | Status: DC | PRN

## 2012-03-04 MED ORDER — FUROSEMIDE 20 MG PO TABS: 20.0000 mg | ORAL_TABLET | Freq: Every day | ORAL | Status: DC

## 2012-03-04 MED ORDER — TRAMADOL HCL 50 MG PO TABS
50.0000 mg | ORAL_TABLET | Freq: Once | ORAL | Status: AC
  Administered 2012-03-04: 50 mg via ORAL
  Filled 2012-03-04: qty 1

## 2012-03-04 NOTE — ED Notes (Signed)
Report given to [facility] for pt. Transport to Home.

## 2012-03-04 NOTE — ED Provider Notes (Signed)
History     CSN: [id number]  Arrival date & time 03/04/12  0447   First MD Initiated Contact with Patient 03/04/12 0455      Chief Complaint  Patient presents with  . Leg Pain    (Consider location/radiation/quality/duration/timing/severity/associated sxs/prior treatment) HPI 61 year old female presents to emergency room via EMS with complaint of bilateral lower extremity pain. Pain started yesterday worsened throughout the night. She reports she's taken Tylenol, Advil, aspirin without improvement. Patient has home health nurse he comes 3 times a week, she was told by her home health nurse that she probably had blood clots in both of her legs. Patient reports she's been able to walk for about a month. Of note, patient ambulated from her home to the ambulance. Patient denies any trauma to her legs, no worsening swelling, pain goes from bilateral hips all the way to toes. No prior history of DVT or PE. Patient is well-known to the emergency room with frequent visits for chest pain. She reports she is having chest pain today, reports it is no worse than her baseline. Patient is requesting admission "to have people take care of me" no fever no chills. Patient also complaining of left arm pain similar to the pain and she's having her legs.  Past Medical History  Diagnosis Date  . Schizoaffective disorder   . Urinary tract infection   . Hypertension   . Atrial fibrillation   . GERD (gastroesophageal reflux disease)   . Hypothyroidism   . Myocardial infarction   . Diabetes mellitus   . Obesity   . Asthma   . COPD (chronic obstructive pulmonary disease)   . Pneumonia   . CHF (congestive heart failure)   . Seizures   . Morbid obesity   . Chronic pain     Past Surgical History  Procedure Date  . Tubal ligation   . Abdominal hysterectomy     Family History  Problem Relation Age of Onset  . Heart disease Father   . Cancer Mother 17    Breast    History  Substance Use Topics    . Smoking status: Current Every Day Smoker -- 1.0 packs/day for 12 years    Last Attempt to Quit: 06/29/2000  . Smokeless tobacco: Current User    Types: Snuff, Chew  . Alcohol Use: No    OB History    Grav Para Term Preterm Abortions TAB SAB Ect Mult Living                  Review of Systems  Cardiovascular: Positive for chest pain.  Musculoskeletal: Positive for myalgias, arthralgias and gait problem.  Neurological: Positive for weakness.  All other systems reviewed and are negative.   other than listed in history of present illness  Allergies  Sulfamethoxazole w-trimethoprim and Penicillins  Home Medications   Current Outpatient Rx  Name Route Sig Dispense Refill  . ACETAMINOPHEN 325 MG PO TABS Oral Take 650 mg by mouth every 6 (six) hours as needed. For pain    . ALBUTEROL SULFATE HFA 108 (90 BASE) MCG/ACT IN AERS Inhalation Inhale 1 puff into the lungs every 6 (six) hours as needed. For wheezing    . ASPIRIN EC 81 MG PO TBEC Oral Take 81 mg by mouth daily.     . CHLORPROMAZINE HCL 50 MG PO TABS Oral Take 50 mg by mouth daily.     Marland Kitchen DIVALPROEX SODIUM ER 500 MG PO TB24 Oral Take 500 mg by mouth 2 (  two) times daily.    Marland Kitchen FLUTICASONE PROPIONATE 50 MCG/ACT NA SUSP Nasal Place 2 sprays into the nose daily. For nasal congestion    . FOLIC ACID 1 MG PO TABS Oral Take 1 mg by mouth every morning.     Marland Kitchen LEVOTHYROXINE SODIUM 150 MCG PO TABS Oral Take 150 mcg by mouth daily.    Marland Kitchen LISINOPRIL 20 MG PO TABS Oral Take 1 tablet (20 mg total) by mouth every 12 (twelve) hours. 60 tablet 0  . METOPROLOL SUCCINATE ER 50 MG PO TB24 Oral Take 50 mg by mouth daily. Take with or immediately following a meal.    . OMEPRAZOLE 20 MG PO CPDR Oral Take 20 mg by mouth daily.     Marland Kitchen PALIPERIDONE ER 6 MG PO TB24 Oral Take 6 mg by mouth daily.    . QUETIAPINE FUMARATE 100 MG PO TABS Oral Take 200 mg by mouth at bedtime.    . TRAZODONE HCL 100 MG PO TABS Oral Take 200 mg by mouth at bedtime.    .  FUROSEMIDE 20 MG PO TABS Oral Take 1 tablet (20 mg total) by mouth daily. 15 tablet 0  . TRAMADOL HCL 50 MG PO TABS Oral Take 1 tablet (50 mg total) by mouth every 6 (six) hours as needed for pain. 15 tablet 0    BP 148/76  Pulse 78  Temp 98.3 F (36.8 C) (Oral)  Resp 20  SpO2 100%  Physical Exam  Constitutional:       Morbidly obese female in no acute distress  HENT:  Head: Normocephalic and atraumatic.  Nose: Nose normal.  Mouth/Throat: Oropharynx is clear and moist.  Neck: Normal range of motion. Neck supple. No JVD present. No tracheal deviation present. No thyromegaly present.  Cardiovascular: Normal rate, regular rhythm, normal heart sounds and intact distal pulses.  Exam reveals no gallop and no friction rub.   No murmur heard. Pulmonary/Chest: Effort normal and breath sounds normal. No stridor. No respiratory distress. She has no wheezes. She has no rales. She exhibits no tenderness.  Abdominal: Soft. Bowel sounds are normal. She exhibits no distension and no mass. There is no tenderness. There is no rebound and no guarding.  Musculoskeletal: Normal range of motion. She exhibits edema (2+ pitting edema in both feet and pretibial region) and tenderness (pt with tenderness to palpation over entire lower extremities as well as upper extremities).  Lymphadenopathy:    She has no cervical adenopathy.  Skin: Skin is warm and dry. No rash noted. No erythema. No pallor.       No overlying skin findings, crepitus, rash in areas of pain    ED Course  Procedures (including critical care time)  Labs Reviewed  BASIC METABOLIC PANEL - Abnormal; Notable for the following:    CO2 35 (*)     Glucose, Bld 116 (*)     GFR calc non Af Amer 73 (*)     GFR calc Af Amer 85 (*)     All other components within normal limits  PRO B NATRIURETIC PEPTIDE  CK TOTAL AND CKMB   No results found.   1. Myalgia   2. Peripheral edema       MDM  61 year old female with bilateral lower  extremity pain, no findings to suggest DVT. No elevation in CK, we'll give low-dose Lasix to help with peripheral edema, patient is been given a prescription for a walker to help with ambulation. And followup numbers as patient was  a [facility] client        Olivia Mackie, MD 03/04/12 [phone]

## 2012-03-04 NOTE — ED Notes (Signed)
Pt. Has c/o bilateral leg pain that started yesterday and got worse tonight and pt. Has c/o rectal bleeding.  Pt. reports "I have not seen any blood."Pt. Ambulated to the EMS truck.  Per EMS pt.'s "husband says that pt. Is ok that she just wants to come to the hospital."

## 2012-03-09 ENCOUNTER — Encounter (HOSPITAL_COMMUNITY): Payer: Self-pay | Admitting: Nurse Practitioner

## 2012-03-09 ENCOUNTER — Emergency Department (HOSPITAL_COMMUNITY)
Admission: EM | Admit: 2012-03-09 | Discharge: 2012-03-09 | Disposition: A | Discharge status: Home / Self Care | Payer: Medicare Other | Attending: Emergency Medicine | Admitting: Emergency Medicine

## 2012-03-09 DIAGNOSIS — R079 Chest pain, unspecified: Secondary | ICD-10-CM | POA: Insufficient documentation

## 2012-03-09 NOTE — ED Notes (Signed)
On arrival to ed triage, the pt states she is not going to wait to see the doctor. States her fiancee is coming to get her. Encouraged pt to stay for medical evaluation and she states she wants to leave. Pt ambulated out of ED.

## 2012-03-09 NOTE — ED Notes (Signed)
Per ems: pt reports she woke from nap 2 hours ago with chest pain. ems reports pt appears to have taken multiple pain pills today, multiple bottles and prescriptions in house. cbg 120. Negative stroke screen. En route, VSS, NSR,324mg  ASA given, unable to obtain iv access. Pt continues to c/o severe dull pain throughout transport.

## 2012-03-14 ENCOUNTER — Emergency Department (HOSPITAL_COMMUNITY)
Admission: EM | Admit: 2012-03-14 | Discharge: 2012-03-14 | Disposition: A | Discharge status: Home / Self Care | Payer: Medicare Other | Attending: Emergency Medicine | Admitting: Emergency Medicine

## 2012-03-14 ENCOUNTER — Encounter (HOSPITAL_COMMUNITY): Payer: Self-pay | Admitting: *Deleted

## 2012-03-14 DIAGNOSIS — R059 Cough, unspecified: Secondary | ICD-10-CM | POA: Insufficient documentation

## 2012-03-14 DIAGNOSIS — Z79899 Other long term (current) drug therapy: Secondary | ICD-10-CM | POA: Insufficient documentation

## 2012-03-14 DIAGNOSIS — Z7982 Long term (current) use of aspirin: Secondary | ICD-10-CM | POA: Insufficient documentation

## 2012-03-14 DIAGNOSIS — E039 Hypothyroidism, unspecified: Secondary | ICD-10-CM | POA: Insufficient documentation

## 2012-03-14 DIAGNOSIS — I509 Heart failure, unspecified: Secondary | ICD-10-CM | POA: Insufficient documentation

## 2012-03-14 DIAGNOSIS — K219 Gastro-esophageal reflux disease without esophagitis: Secondary | ICD-10-CM | POA: Insufficient documentation

## 2012-03-14 DIAGNOSIS — R0602 Shortness of breath: Secondary | ICD-10-CM | POA: Insufficient documentation

## 2012-03-14 DIAGNOSIS — E119 Type 2 diabetes mellitus without complications: Secondary | ICD-10-CM | POA: Insufficient documentation

## 2012-03-14 DIAGNOSIS — J449 Chronic obstructive pulmonary disease, unspecified: Secondary | ICD-10-CM | POA: Insufficient documentation

## 2012-03-14 DIAGNOSIS — J4489 Other specified chronic obstructive pulmonary disease: Secondary | ICD-10-CM | POA: Insufficient documentation

## 2012-03-14 DIAGNOSIS — I4891 Unspecified atrial fibrillation: Secondary | ICD-10-CM | POA: Insufficient documentation

## 2012-03-14 DIAGNOSIS — F17200 Nicotine dependence, unspecified, uncomplicated: Secondary | ICD-10-CM | POA: Insufficient documentation

## 2012-03-14 DIAGNOSIS — R05 Cough: Secondary | ICD-10-CM | POA: Insufficient documentation

## 2012-03-14 DIAGNOSIS — R569 Unspecified convulsions: Secondary | ICD-10-CM | POA: Insufficient documentation

## 2012-03-14 DIAGNOSIS — I10 Essential (primary) hypertension: Secondary | ICD-10-CM | POA: Insufficient documentation

## 2012-03-14 MED ORDER — BENZONATATE 200 MG PO CAPS: 200.0000 mg | ORAL_CAPSULE | Freq: Three times a day (TID) | ORAL | Status: DC | PRN

## 2012-03-14 NOTE — ED Notes (Signed)
ptar called by secretary.  

## 2012-03-14 NOTE — ED Notes (Signed)
Per EMS- pt report SOB for several days. States that she has had green sputum as well. EMS states that she was 98% on room air. NAD noted. VSS with EMS.

## 2012-03-14 NOTE — ED Provider Notes (Signed)
History     CSN: [id number]  Arrival date & time 03/14/12  1147   First MD Initiated Contact with Patient 03/14/12 1201      Chief Complaint  Patient presents with  . Shortness of Breath    (Consider location/radiation/quality/duration/timing/severity/associated sxs/prior treatment) HPI  61 y.o. female INAD c/o cough and SOB x2 days. Denies fever, CP, abdominal pain, NV, change in bowel or bladder habits.    Past Medical History  Diagnosis Date  . Schizoaffective disorder   . Urinary tract infection   . Hypertension   . Atrial fibrillation   . GERD (gastroesophageal reflux disease)   . Hypothyroidism   . Myocardial infarction   . Diabetes mellitus   . Obesity   . Asthma   . COPD (chronic obstructive pulmonary disease)   . Pneumonia   . CHF (congestive heart failure)   . Seizures   . Morbid obesity   . Chronic pain     Past Surgical History  Procedure Date  . Tubal ligation   . Abdominal hysterectomy     Family History  Problem Relation Age of Onset  . Heart disease Father   . Cancer Mother 26    Breast    History  Substance Use Topics  . Smoking status: Current Every Day Smoker -- 1.0 packs/day for 12 years    Last Attempt to Quit: 06/29/2000  . Smokeless tobacco: Current User    Types: Snuff, Chew  . Alcohol Use: No    OB History    Grav Para Term Preterm Abortions TAB SAB Ect Mult Living                  Review of Systems  Constitutional: Negative for fever.  Respiratory: Positive for cough and shortness of breath.   Cardiovascular: Negative for chest pain.  Gastrointestinal: Negative for nausea, vomiting, abdominal pain and diarrhea.  All other systems reviewed and are negative.    Allergies  Sulfamethoxazole w-trimethoprim and Penicillins  Home Medications   Current Outpatient Rx  Name Route Sig Dispense Refill  . ACETAMINOPHEN 325 MG PO TABS Oral Take 650 mg by mouth every 6 (six) hours as needed. For pain    . ALBUTEROL  SULFATE HFA 108 (90 BASE) MCG/ACT IN AERS Inhalation Inhale 1 puff into the lungs every 6 (six) hours as needed. For wheezing    . ASPIRIN EC 81 MG PO TBEC Oral Take 81 mg by mouth daily.     . CHLORPROMAZINE HCL 50 MG PO TABS Oral Take 50 mg by mouth daily.     Marland Kitchen DIVALPROEX SODIUM ER 500 MG PO TB24 Oral Take 500 mg by mouth 2 (two) times daily.    Marland Kitchen FLUTICASONE PROPIONATE 50 MCG/ACT NA SUSP Nasal Place 2 sprays into the nose daily. For nasal congestion    . FOLIC ACID 1 MG PO TABS Oral Take 1 mg by mouth every morning.     . FUROSEMIDE 20 MG PO TABS Oral Take 20 mg by mouth daily.    Marland Kitchen LEVOTHYROXINE SODIUM 150 MCG PO TABS Oral Take 150 mcg by mouth daily.    Marland Kitchen LISINOPRIL 20 MG PO TABS Oral Take 1 tablet (20 mg total) by mouth every 12 (twelve) hours. 60 tablet 0  . METOPROLOL SUCCINATE ER 50 MG PO TB24 Oral Take 50 mg by mouth daily. Take with or immediately following a meal.    . OMEPRAZOLE 20 MG PO CPDR Oral Take 20 mg by mouth daily.     Marland Kitchen  PALIPERIDONE ER 6 MG PO TB24 Oral Take 6 mg by mouth daily.    . QUETIAPINE FUMARATE 100 MG PO TABS Oral Take 200 mg by mouth at bedtime.    . TRAMADOL HCL 50 MG PO TABS Oral Take 50 mg by mouth every 6 (six) hours as needed. For pain    . TRAZODONE HCL 100 MG PO TABS Oral Take 200 mg by mouth at bedtime.    Marland Kitchen BENZONATATE 200 MG PO CAPS Oral Take 1 capsule (200 mg total) by mouth 3 (three) times daily as needed for cough. 20 capsule 0    BP 122/86  Pulse 74  Temp 98 F (36.7 C) (Oral)  Resp 20  SpO2 98%  Physical Exam  Nursing note and vitals reviewed. Constitutional: She is oriented to person, place, and time. She appears well-developed and well-nourished. No distress.       obese  HENT:  Head: Normocephalic.  Eyes: Conjunctivae normal and EOM are normal.  Neck: Normal range of motion. No JVD present.  Cardiovascular: Normal rate, regular rhythm, normal heart sounds and intact distal pulses.   Pulmonary/Chest: Effort normal and breath sounds  normal. No stridor. No respiratory distress. She has no wheezes. She has no rales. She exhibits no tenderness.  Abdominal: Soft. Bowel sounds are normal.  Musculoskeletal: Normal range of motion.  Neurological: She is alert and oriented to person, place, and time.  Psychiatric: She has a normal mood and affect.    ED Course  Procedures (including critical care time)  Labs Reviewed - No data to display No results found.   1. Cough       MDM  61 y.o. female with cough and SOB x48 hours patient is saturating 100% on bruit air, speaking in. Mrs., nontachypneic, no coughing observed, lung sounds are clear to auscultation bilaterally. Vital signs and can followup as an outpatient. We'll write her a prescription for cough suppression  New Prescriptions   BENZONATATE (TESSALON) 200 MG CAPSULE    Take 1 capsule (200 mg total) by mouth 3 (three) times daily as needed for cough.          Wynetta Emery, PA-C 03/14/12 1318

## 2012-03-17 NOTE — ED Provider Notes (Signed)
Medical screening examination/treatment/procedure(s) were conducted as a shared visit with non-physician practitioner(s) and myself.  I personally evaluated the patient during the encounter  Flint Melter, MD 03/17/12 1218

## 2012-03-20 ENCOUNTER — Emergency Department (HOSPITAL_COMMUNITY)
Admission: EM | Admit: 2012-03-20 | Discharge: 2012-03-20 | Disposition: A | Discharge status: Home / Self Care | Payer: Medicare Other | Attending: Emergency Medicine | Admitting: Emergency Medicine

## 2012-03-20 ENCOUNTER — Encounter (HOSPITAL_COMMUNITY): Payer: Self-pay | Admitting: *Deleted

## 2012-03-20 ENCOUNTER — Encounter (HOSPITAL_COMMUNITY): Payer: Self-pay | Admitting: Emergency Medicine

## 2012-03-20 ENCOUNTER — Emergency Department (HOSPITAL_COMMUNITY)
Admission: EM | Admit: 2012-03-20 | Discharge: 2012-03-21 | Disposition: A | Discharge status: Home / Self Care | Payer: Medicare Other | Attending: Emergency Medicine | Admitting: Emergency Medicine

## 2012-03-20 DIAGNOSIS — Z888 Allergy status to other drugs, medicaments and biological substances status: Secondary | ICD-10-CM | POA: Insufficient documentation

## 2012-03-20 DIAGNOSIS — R569 Unspecified convulsions: Secondary | ICD-10-CM | POA: Insufficient documentation

## 2012-03-20 DIAGNOSIS — J449 Chronic obstructive pulmonary disease, unspecified: Secondary | ICD-10-CM | POA: Insufficient documentation

## 2012-03-20 DIAGNOSIS — Z7982 Long term (current) use of aspirin: Secondary | ICD-10-CM | POA: Insufficient documentation

## 2012-03-20 DIAGNOSIS — E039 Hypothyroidism, unspecified: Secondary | ICD-10-CM | POA: Insufficient documentation

## 2012-03-20 DIAGNOSIS — J4489 Other specified chronic obstructive pulmonary disease: Secondary | ICD-10-CM | POA: Insufficient documentation

## 2012-03-20 DIAGNOSIS — K219 Gastro-esophageal reflux disease without esophagitis: Secondary | ICD-10-CM | POA: Insufficient documentation

## 2012-03-20 DIAGNOSIS — R059 Cough, unspecified: Secondary | ICD-10-CM | POA: Insufficient documentation

## 2012-03-20 DIAGNOSIS — N898 Other specified noninflammatory disorders of vagina: Secondary | ICD-10-CM | POA: Insufficient documentation

## 2012-03-20 DIAGNOSIS — I4891 Unspecified atrial fibrillation: Secondary | ICD-10-CM | POA: Insufficient documentation

## 2012-03-20 DIAGNOSIS — G8929 Other chronic pain: Secondary | ICD-10-CM | POA: Insufficient documentation

## 2012-03-20 DIAGNOSIS — E119 Type 2 diabetes mellitus without complications: Secondary | ICD-10-CM | POA: Insufficient documentation

## 2012-03-20 DIAGNOSIS — I509 Heart failure, unspecified: Secondary | ICD-10-CM | POA: Insufficient documentation

## 2012-03-20 DIAGNOSIS — I10 Essential (primary) hypertension: Secondary | ICD-10-CM | POA: Insufficient documentation

## 2012-03-20 DIAGNOSIS — I252 Old myocardial infarction: Secondary | ICD-10-CM | POA: Insufficient documentation

## 2012-03-20 DIAGNOSIS — R05 Cough: Secondary | ICD-10-CM

## 2012-03-20 DIAGNOSIS — Z765 Malingerer [conscious simulation]: Secondary | ICD-10-CM | POA: Insufficient documentation

## 2012-03-20 DIAGNOSIS — F17200 Nicotine dependence, unspecified, uncomplicated: Secondary | ICD-10-CM | POA: Insufficient documentation

## 2012-03-20 DIAGNOSIS — R109 Unspecified abdominal pain: Secondary | ICD-10-CM

## 2012-03-20 DIAGNOSIS — F259 Schizoaffective disorder, unspecified: Secondary | ICD-10-CM | POA: Insufficient documentation

## 2012-03-20 DIAGNOSIS — N924 Excessive bleeding in the premenopausal period: Secondary | ICD-10-CM | POA: Insufficient documentation

## 2012-03-20 DIAGNOSIS — F1910 Other psychoactive substance abuse, uncomplicated: Secondary | ICD-10-CM | POA: Insufficient documentation

## 2012-03-20 DIAGNOSIS — F70 Mild intellectual disabilities: Secondary | ICD-10-CM | POA: Insufficient documentation

## 2012-03-20 DIAGNOSIS — E669 Obesity, unspecified: Secondary | ICD-10-CM | POA: Insufficient documentation

## 2012-03-20 DIAGNOSIS — R0789 Other chest pain: Secondary | ICD-10-CM | POA: Insufficient documentation

## 2012-03-20 LAB — CBC WITH DIFFERENTIAL/PLATELET
Basophils Absolute: 0 10*3/uL (ref 0.0–0.1)
HCT: 39.7 % (ref 36.0–46.0)
Hemoglobin: 13.2 g/dL (ref 12.0–15.0)
Lymphocytes Relative: 50 % — ABNORMAL HIGH (ref 12–46)
Monocytes Absolute: 0.7 10*3/uL (ref 0.1–1.0)
Neutro Abs: 1.7 10*3/uL (ref 1.7–7.7)
RDW: 14.2 % (ref 11.5–15.5)
WBC: 4.9 10*3/uL (ref 4.0–10.5)

## 2012-03-20 LAB — COMPREHENSIVE METABOLIC PANEL
ALT: 17 U/L (ref 0–35)
AST: 25 U/L (ref 0–37)
CO2: 32 mEq/L (ref 19–32)
Chloride: 99 mEq/L (ref 96–112)
Creatinine, Ser: 0.84 mg/dL (ref 0.50–1.10)
GFR calc non Af Amer: 75 mL/min — ABNORMAL LOW (ref >90)
Total Bilirubin: 0.2 mg/dL — ABNORMAL LOW (ref 0.3–1.2)

## 2012-03-20 LAB — URINALYSIS, ROUTINE W REFLEX MICROSCOPIC
Glucose, UA: NEGATIVE mg/dL
Leukocytes, UA: NEGATIVE
Protein, ur: NEGATIVE mg/dL
Specific Gravity, Urine: 1.013 (ref 1.005–1.030)
pH: 7 (ref 5.0–8.0)

## 2012-03-20 LAB — WET PREP, GENITAL: Yeast Wet Prep HPF POC: NONE SEEN

## 2012-03-20 MED ORDER — OXYCODONE-ACETAMINOPHEN 5-325 MG PO TABS
2.0000 | ORAL_TABLET | Freq: Once | ORAL | Status: AC
  Administered 2012-03-20: 2 via ORAL
  Filled 2012-03-20: qty 2

## 2012-03-20 NOTE — ED Provider Notes (Signed)
History     CSN: [id number]  Arrival date & time 03/20/12  1413   First MD Initiated Contact with Patient 03/20/12 1419      Chief Complaint  Patient presents with  . Vaginal Bleeding  . Shortness of Breath    (Consider location/radiation/quality/duration/timing/severity/associated sxs/prior treatment) HPI  61 y.o. female INADc/o post menopausal vaginal bleed onset last night  And SOB onset at the same time. Patient denies any chest pain, palpitations, lightheaded sensation when going from sitting to standing, patient is also requesting pain medication for generalized pain.  Past Medical History  Diagnosis Date  . Schizoaffective disorder   . Urinary tract infection   . Hypertension   . Atrial fibrillation   . GERD (gastroesophageal reflux disease)   . Hypothyroidism   . Myocardial infarction   . Diabetes mellitus   . Obesity   . Asthma   . COPD (chronic obstructive pulmonary disease)   . Pneumonia   . CHF (congestive heart failure)   . Seizures   . Morbid obesity   . Chronic pain     Past Surgical History  Procedure Date  . Tubal ligation   . Abdominal hysterectomy     Family History  Problem Relation Age of Onset  . Heart disease Father   . Cancer Mother 5    Breast    History  Substance Use Topics  . Smoking status: Current Every Day Smoker -- 1.0 packs/day for 12 years    Last Attempt to Quit: 06/29/2000  . Smokeless tobacco: Current User    Types: Snuff, Chew  . Alcohol Use: No    OB History    Grav Para Term Preterm Abortions TAB SAB Ect Mult Living                  Review of Systems  Constitutional: Negative for fever.  Respiratory: Positive for shortness of breath.   Cardiovascular: Negative for chest pain.  Gastrointestinal: Negative for nausea, vomiting, abdominal pain and diarrhea.  Genitourinary: Positive for menstrual problem.  All other systems reviewed and are negative.    Allergies  Sulfamethoxazole w-trimethoprim and  Penicillins  Home Medications   Current Outpatient Rx  Name Route Sig Dispense Refill  . ACETAMINOPHEN 325 MG PO TABS Oral Take 650 mg by mouth every 6 (six) hours as needed. For pain    . ALBUTEROL SULFATE HFA 108 (90 BASE) MCG/ACT IN AERS Inhalation Inhale 1 puff into the lungs every 6 (six) hours as needed. For wheezing    . ASPIRIN EC 81 MG PO TBEC Oral Take 81 mg by mouth daily.     . CHLORPROMAZINE HCL 50 MG PO TABS Oral Take 50 mg by mouth daily.     Marland Kitchen DIVALPROEX SODIUM ER 500 MG PO TB24 Oral Take 500 mg by mouth 2 (two) times daily.    Marland Kitchen FLUTICASONE PROPIONATE 50 MCG/ACT NA SUSP Nasal Place 2 sprays into the nose daily. For nasal congestion    . FOLIC ACID 1 MG PO TABS Oral Take 1 mg by mouth every morning.     . FUROSEMIDE 20 MG PO TABS Oral Take 20 mg by mouth daily.    Marland Kitchen LISINOPRIL 20 MG PO TABS Oral Take 1 tablet (20 mg total) by mouth every 12 (twelve) hours. 60 tablet 0  . METOPROLOL SUCCINATE ER 50 MG PO TB24 Oral Take 50 mg by mouth daily. Take with or immediately following a meal.    . OMEPRAZOLE 20 MG PO  CPDR Oral Take 20 mg by mouth daily.    Marland Kitchen PALIPERIDONE ER 6 MG PO TB24 Oral Take 6 mg by mouth daily.    . QUETIAPINE FUMARATE 100 MG PO TABS Oral Take 200 mg by mouth at bedtime.    . TRAZODONE HCL 100 MG PO TABS Oral Take 200 mg by mouth at bedtime.      BP 157/134  Pulse 79  Temp 97.3 F (36.3 C) (Oral)  Resp 16  SpO2 97%  Physical Exam  Nursing note and vitals reviewed. Constitutional: She is oriented to person, place, and time. She appears well-developed and well-nourished. No distress.       Obese  HENT:  Head: Normocephalic and atraumatic.  Mouth/Throat: Oropharynx is clear and moist.  Eyes: Conjunctivae normal and EOM are normal. Pupils are equal, round, and reactive to light.  Neck: Normal range of motion.  Cardiovascular: Normal rate and regular rhythm.   Pulmonary/Chest: Effort normal and breath sounds normal. No stridor. No respiratory distress.  She has no wheezes. She has no rales. She exhibits no tenderness.       Pt is saturating 98% on room air with no signs of respiratory distress.  Abdominal: Soft. Bowel sounds are normal. She exhibits no distension.  Genitourinary: There is no lesion on the right labia. There is no lesion on the left labia. Cervix exhibits no motion tenderness, no discharge and no friability. Right adnexum displays no mass, no tenderness and no fullness. Left adnexum displays no mass, no tenderness and no fullness. No vaginal discharge found.       Pelvic  Exam chaperoned no blood in posterior fourchette or emanating from the cervix. No cervical motion tenderness or abnormal discharge.  Musculoskeletal: Normal range of motion.  Neurological: She is alert and oriented to person, place, and time.  Psychiatric: She has a normal mood and affect.    ED Course  Procedures (including critical care time)  Labs Reviewed  URINALYSIS, ROUTINE W REFLEX MICROSCOPIC - Abnormal; Notable for the following:    APPearance HAZY (*)     All other components within normal limits  CBC WITH DIFFERENTIAL - Abnormal; Notable for the following:    RBC 3.85 (*)     MCV 103.1 (*)     MCH 34.3 (*)     Platelets 145 (*)     Neutrophils Relative 36 (*)     Lymphocytes Relative 50 (*)     Monocytes Relative 14 (*)     All other components within normal limits  COMPREHENSIVE METABOLIC PANEL - Abnormal; Notable for the following:    Glucose, Bld 162 (*)     Albumin 2.9 (*)     Total Bilirubin 0.2 (*)     GFR calc non Af Amer 75 (*)     GFR calc Af Amer 87 (*)     All other components within normal limits  LIPASE, BLOOD  WET PREP, GENITAL  GC/CHLAMYDIA PROBE AMP, GENITAL   No results found.   1. OBESITY   2. RETARDATION, MENTAL, MILD   3. Abnormal perimenopausal bleeding       MDM  No abnormalities on physical exam, work urinalysis and wet prep are all normal. Advised patient to follow with OB/GYN for endometrial biopsy.          Wynetta Emery, PA-C 03/20/12 1645

## 2012-03-20 NOTE — ED Notes (Signed)
Report given via EMS. Pt c/o vaginal bleed that started last night, blood in emesis earlier but none seen during transport, and sharp generalized abdominal pain that started last night. Initial VS 156/90 Pulse 88 RR 20 at 2255.

## 2012-03-20 NOTE — ED Provider Notes (Signed)
History     CSN: [id number]  Arrival date & time 03/20/12  2256   None     Chief Complaint  Patient presents with  . Vaginal Bleeding    (Consider location/radiation/quality/duration/timing/severity/associated sxs/prior treatment) HPI Comments: 61 y/o female with frequent ED visits for pain complaints, who has already been seen in the last 12 hours for similar complaints.  She complains of vaginal bleeding, states this has been going on over night and has found blood on the sheets of her bed at home. When I informed her that her vaginal exam showed no bleeding earlier in the day she states to well then my abdomen hurts, when I examine her abdomen and it is soft and nontender she states oh but I'm having hallucinations.  The patient is resting comfortably, answers all my questions and is not appear to be responding to any internal stimuli.  Patient is a 61 y.o. female presenting with vaginal bleeding. The history is provided by the patient, medical records and the EMS personnel.  Vaginal Bleeding    Past Medical History  Diagnosis Date  . Schizoaffective disorder   . Urinary tract infection   . Hypertension   . Atrial fibrillation   . GERD (gastroesophageal reflux disease)   . Hypothyroidism   . Myocardial infarction   . Diabetes mellitus   . Obesity   . Asthma   . COPD (chronic obstructive pulmonary disease)   . Pneumonia   . CHF (congestive heart failure)   . Seizures   . Morbid obesity   . Chronic pain     Past Surgical History  Procedure Date  . Tubal ligation   . Abdominal hysterectomy     Family History  Problem Relation Age of Onset  . Heart disease Father   . Cancer Mother 52    Breast    History  Substance Use Topics  . Smoking status: Current Every Day Smoker -- 1.0 packs/day for 12 years    Last Attempt to Quit: 06/29/2000  . Smokeless tobacco: Current User    Types: Snuff, Chew  . Alcohol Use: No    OB History    Grav Para Term Preterm  Abortions TAB SAB Ect Mult Living                  Review of Systems  Genitourinary: Positive for vaginal bleeding.  All other systems reviewed and are negative.    Allergies  Sulfamethoxazole w-trimethoprim and Penicillins  Home Medications   Current Outpatient Rx  Name Route Sig Dispense Refill  . ACETAMINOPHEN 325 MG PO TABS Oral Take 650 mg by mouth every 6 (six) hours as needed. For pain    . ALBUTEROL SULFATE HFA 108 (90 BASE) MCG/ACT IN AERS Inhalation Inhale 1 puff into the lungs every 6 (six) hours as needed. For wheezing    . ASPIRIN EC 81 MG PO TBEC Oral Take 81 mg by mouth daily.     Marland Kitchen DIVALPROEX SODIUM ER 500 MG PO TB24 Oral Take 500 mg by mouth 2 (two) times daily.    Marland Kitchen FLUTICASONE PROPIONATE 50 MCG/ACT NA SUSP Nasal Place 2 sprays into the nose daily. For nasal congestion    . FOLIC ACID 1 MG PO TABS Oral Take 1 mg by mouth every morning.     . FUROSEMIDE 20 MG PO TABS Oral Take 20 mg by mouth daily.    Marland Kitchen LEVOTHYROXINE SODIUM 150 MCG PO TABS Oral Take 150 mcg by mouth daily.    Marland Kitchen  METOPROLOL SUCCINATE ER 50 MG PO TB24 Oral Take 50 mg by mouth daily. Take with or immediately following a meal.    . OMEPRAZOLE 20 MG PO CPDR Oral Take 20 mg by mouth daily.    Marland Kitchen PALIPERIDONE ER 6 MG PO TB24 Oral Take 6 mg by mouth daily.      There were no vitals taken for this visit.  Physical Exam  Nursing note and vitals reviewed. Constitutional: She appears well-developed and well-nourished. No distress.  HENT:  Head: Normocephalic and atraumatic.  Mouth/Throat: Oropharynx is clear and moist. No oropharyngeal exudate.       Large plug of chewing tobacco in the right cheek  Eyes: Conjunctivae normal and EOM are normal. Pupils are equal, round, and reactive to light. Right eye exhibits no discharge. Left eye exhibits no discharge. No scleral icterus.  Neck: Normal range of motion. Neck supple. No JVD present. No thyromegaly present.  Cardiovascular: Normal rate, regular rhythm,  normal heart sounds and intact distal pulses.  Exam reveals no gallop and no friction rub.   No murmur heard.      Strong pulses at the radial arteries bilaterally  Pulmonary/Chest: Effort normal and breath sounds normal. No respiratory distress. She has no wheezes. She has no rales.       Lungs clear without any rales or increased work of breathing, speaks in full sentences  Abdominal: Soft. Bowel sounds are normal. She exhibits no distension and no mass. There is no tenderness.       No tenderness in her morbidly obese abdomen  Musculoskeletal: Normal range of motion. She exhibits no edema and no tenderness.       Mild bilateral peripheral edema, no asymmetry  Lymphadenopathy:    She has no cervical adenopathy.  Neurological: She is alert. Coordination normal.       Follows commands, clear speech, uses all extremities x4  Skin: Skin is warm and dry. No rash noted. No erythema.  Psychiatric: She has a normal mood and affect. Her behavior is normal.    ED Course  Procedures (including critical care time)   Labs Reviewed  CBC WITH DIFFERENTIAL  COMPREHENSIVE METABOLIC PANEL  URINALYSIS, ROUTINE W REFLEX MICROSCOPIC   No results found.   No diagnosis found.    MDM  The patient has had labs drawn earlier in the day, please see below including a normal urinalysis without infection or signs of dehydration, no leukocytosis and no anemia. Her electrolytes are normal, vaginal exam showed no blood cells and no infection. At this time will obtain a chest x-ray to rule out any infiltrate as the patient also complains of coughing and chest pain, she is not hypoxic, not tachycardic and has a normal blood pressure 118/80 without a fever. The patient appears very stable.      Results for orders placed during the hospital encounter of 03/20/12  URINALYSIS, ROUTINE W REFLEX MICROSCOPIC      Component Value Range   Color, Urine YELLOW  YELLOW   APPearance HAZY (*) CLEAR   Specific Gravity,  Urine 1.013  1.005 - 1.030   pH 7.0  5.0 - 8.0   Glucose, UA NEGATIVE  NEGATIVE mg/dL   Hgb urine dipstick NEGATIVE  NEGATIVE   Bilirubin Urine NEGATIVE  NEGATIVE   Ketones, ur NEGATIVE  NEGATIVE mg/dL   Protein, ur NEGATIVE  NEGATIVE mg/dL   Urobilinogen, UA 1.0  0.0 - 1.0 mg/dL   Nitrite NEGATIVE  NEGATIVE   Leukocytes, UA NEGATIVE  NEGATIVE  CBC WITH DIFFERENTIAL      Component Value Range   WBC 4.9  4.0 - 10.5 K/uL   RBC 3.85 (*) 3.87 - 5.11 MIL/uL   Hemoglobin 13.2  12.0 - 15.0 g/dL   HCT 16.1  09.6 - 04.5 %   MCV 103.1 (*) 78.0 - 100.0 fL   MCH 34.3 (*) 26.0 - 34.0 pg   MCHC 33.2  30.0 - 36.0 g/dL   RDW 40.9  81.1 - 91.4 %   Platelets 145 (*) 150 - 400 K/uL   Neutrophils Relative 36 (*) 43 - 77 %   Neutro Abs 1.7  1.7 - 7.7 K/uL   Lymphocytes Relative 50 (*) 12 - 46 %   Lymphs Abs 2.4  0.7 - 4.0 K/uL   Monocytes Relative 14 (*) 3 - 12 %   Monocytes Absolute 0.7  0.1 - 1.0 K/uL   Eosinophils Relative 1  0 - 5 %   Eosinophils Absolute 0.0  0.0 - 0.7 K/uL   Basophils Relative 0  0 - 1 %   Basophils Absolute 0.0  0.0 - 0.1 K/uL  COMPREHENSIVE METABOLIC PANEL      Component Value Range   Sodium 138  135 - 145 mEq/L   Potassium 4.1  3.5 - 5.1 mEq/L   Chloride 99  96 - 112 mEq/L   CO2 32  19 - 32 mEq/L   Glucose, Bld 162 (*) 70 - 99 mg/dL   BUN 11  6 - 23 mg/dL   Creatinine, Ser 7.82  0.50 - 1.10 mg/dL   Calcium 9.6  8.4 - 95.6 mg/dL   Total Protein 6.2  6.0 - 8.3 g/dL   Albumin 2.9 (*) 3.5 - 5.2 g/dL   AST 25  0 - 37 U/L   ALT 17  0 - 35 U/L   Alkaline Phosphatase 50  39 - 117 U/L   Total Bilirubin 0.2 (*) 0.3 - 1.2 mg/dL   GFR calc non Af Amer 75 (*) >90 mL/min   GFR calc Af Amer 87 (*) >90 mL/min  LIPASE, BLOOD      Component Value Range   Lipase 40  11 - 59 U/L  WET PREP, GENITAL      Component Value Range   Yeast Wet Prep HPF POC NONE SEEN  NONE SEEN   Trich, Wet Prep NONE SEEN  NONE SEEN   Clue Cells Wet Prep HPF POC NONE SEEN  NONE SEEN   WBC, Wet Prep  HPF POC NONE SEEN  NONE SEEN    Repeat labs done here show no significant abnormalities, patient has elements of drug-seeking behavior, there is no significant depression or suicidality the patient has a nontender non-peritoneal and nonsurgical abdomen. Patient stable for discharge    Vida Roller, MD 03/21/12 [phone]

## 2012-03-20 NOTE — ED Notes (Signed)
WUJ:[id number]<JY> Expected date:03/20/12<BR> Expected time:10:50 PM<BR> Means of arrival:Ambulance<BR> Comments:<BR> Medical clearance

## 2012-03-20 NOTE — ED Notes (Signed)
Per report pt has been having Vaginal bleeding and shortness of breath since 2200 last night.  No distress noted.  Resp symmetrical and unlabored at this time.  Pt requesting something for generalized pain.

## 2012-03-21 ENCOUNTER — Emergency Department (HOSPITAL_COMMUNITY): Payer: Medicare Other

## 2012-03-21 LAB — CBC WITH DIFFERENTIAL/PLATELET
Hemoglobin: 12.9 g/dL (ref 12.0–15.0)
Lymphs Abs: 2.8 10*3/uL (ref 0.7–4.0)
Monocytes Relative: 13 % — ABNORMAL HIGH (ref 3–12)
Neutro Abs: 2.2 10*3/uL (ref 1.7–7.7)
Neutrophils Relative %: 38 % — ABNORMAL LOW (ref 43–77)
Platelets: 145 10*3/uL — ABNORMAL LOW (ref 150–400)
RBC: 3.83 MIL/uL — ABNORMAL LOW (ref 3.87–5.11)
WBC: 5.8 10*3/uL (ref 4.0–10.5)

## 2012-03-21 LAB — COMPREHENSIVE METABOLIC PANEL
ALT: 15 U/L (ref 0–35)
Albumin: 2.9 g/dL — ABNORMAL LOW (ref 3.5–5.2)
Alkaline Phosphatase: 51 U/L (ref 39–117)
BUN: 11 mg/dL (ref 6–23)
Chloride: 98 mEq/L (ref 96–112)
GFR calc Af Amer: 88 mL/min — ABNORMAL LOW (ref >90)
Glucose, Bld: 120 mg/dL — ABNORMAL HIGH (ref 70–99)
Potassium: 3.9 mEq/L (ref 3.5–5.1)
Sodium: 136 mEq/L (ref 135–145)
Total Bilirubin: 0.2 mg/dL — ABNORMAL LOW (ref 0.3–1.2)

## 2012-03-21 LAB — GC/CHLAMYDIA PROBE AMP, GENITAL
Chlamydia, DNA Probe: NEGATIVE
GC Probe Amp, Genital: NEGATIVE

## 2012-03-21 NOTE — ED Provider Notes (Signed)
Medical screening examination/treatment/procedure(s) were performed by non-physician practitioner and as supervising physician I was immediately available for consultation/collaboration.   Carleene Cooper III, MD 03/21/12 1314

## 2012-03-21 NOTE — ED Notes (Signed)
[facility] contacted for discharge  

## 2012-03-24 IMAGING — CR DG CHEST 2V
1 series · 1 of 1 positions shown · non-contrast
Comparison: Chest x-ray 11/03/2010.

CLINICAL DATA: Chest pain.

CHEST - 2 VIEW

[w chest lat]
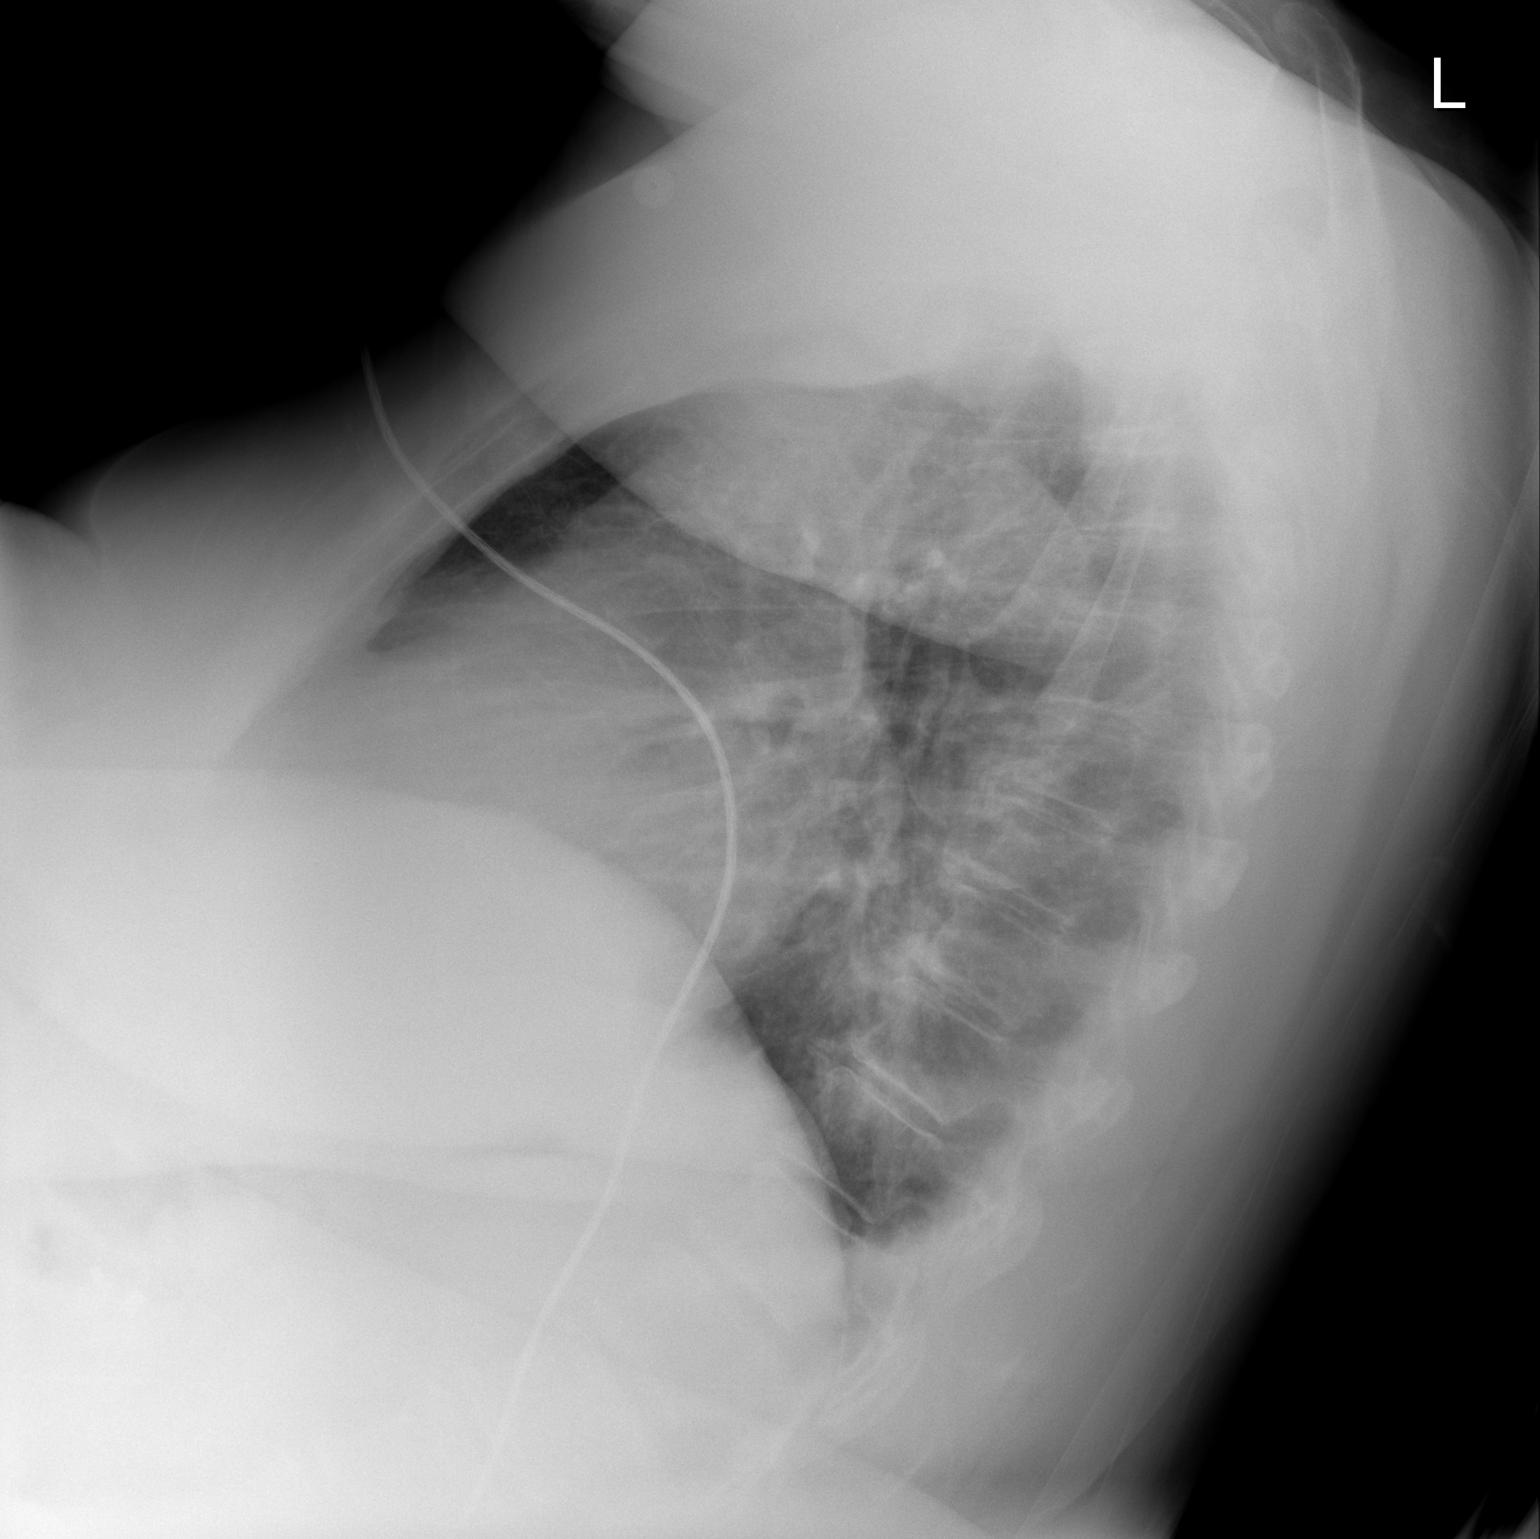

[1 of 1 positions shown; findings below may reference images not displayed]

FINDINGS: The cardiac silhouette, mediastinal and hilar contours
are within normal limits and stable.  The lungs are clear.  The
bony thorax is intact.
IMPRESSION: No acute cardiopulmonary findings.

## 2012-03-27 ENCOUNTER — Emergency Department (HOSPITAL_COMMUNITY): Payer: Medicare Other

## 2012-03-27 ENCOUNTER — Encounter (HOSPITAL_COMMUNITY): Payer: Self-pay | Admitting: *Deleted

## 2012-03-27 ENCOUNTER — Inpatient Hospital Stay (HOSPITAL_COMMUNITY)
Admission: EM | Admit: 2012-03-27 | Discharge: 2012-03-31 | DRG: 189 | Disposition: A | Discharge status: Home / Self Care | Payer: Medicare Other | Attending: Internal Medicine | Admitting: Internal Medicine

## 2012-03-27 DIAGNOSIS — K219 Gastro-esophageal reflux disease without esophagitis: Secondary | ICD-10-CM | POA: Diagnosis present

## 2012-03-27 DIAGNOSIS — I10 Essential (primary) hypertension: Secondary | ICD-10-CM

## 2012-03-27 DIAGNOSIS — I252 Old myocardial infarction: Secondary | ICD-10-CM

## 2012-03-27 DIAGNOSIS — I509 Heart failure, unspecified: Secondary | ICD-10-CM | POA: Diagnosis present

## 2012-03-27 DIAGNOSIS — M1710 Unilateral primary osteoarthritis, unspecified knee: Secondary | ICD-10-CM

## 2012-03-27 DIAGNOSIS — J441 Chronic obstructive pulmonary disease with (acute) exacerbation: Secondary | ICD-10-CM | POA: Diagnosis present

## 2012-03-27 DIAGNOSIS — I959 Hypotension, unspecified: Secondary | ICD-10-CM | POA: Diagnosis present

## 2012-03-27 DIAGNOSIS — F17200 Nicotine dependence, unspecified, uncomplicated: Secondary | ICD-10-CM | POA: Diagnosis present

## 2012-03-27 DIAGNOSIS — R197 Diarrhea, unspecified: Secondary | ICD-10-CM

## 2012-03-27 DIAGNOSIS — R110 Nausea: Secondary | ICD-10-CM

## 2012-03-27 DIAGNOSIS — E538 Deficiency of other specified B group vitamins: Secondary | ICD-10-CM

## 2012-03-27 DIAGNOSIS — M545 Low back pain: Secondary | ICD-10-CM

## 2012-03-27 DIAGNOSIS — D7589 Other specified diseases of blood and blood-forming organs: Secondary | ICD-10-CM | POA: Diagnosis present

## 2012-03-27 DIAGNOSIS — R079 Chest pain, unspecified: Secondary | ICD-10-CM | POA: Diagnosis present

## 2012-03-27 DIAGNOSIS — E039 Hypothyroidism, unspecified: Secondary | ICD-10-CM | POA: Diagnosis present

## 2012-03-27 DIAGNOSIS — J9600 Acute respiratory failure, unspecified whether with hypoxia or hypercapnia: Principal | ICD-10-CM | POA: Diagnosis present

## 2012-03-27 DIAGNOSIS — Z8679 Personal history of other diseases of the circulatory system: Secondary | ICD-10-CM

## 2012-03-27 DIAGNOSIS — G8929 Other chronic pain: Secondary | ICD-10-CM | POA: Diagnosis present

## 2012-03-27 DIAGNOSIS — G4733 Obstructive sleep apnea (adult) (pediatric): Secondary | ICD-10-CM | POA: Diagnosis present

## 2012-03-27 DIAGNOSIS — F319 Bipolar disorder, unspecified: Secondary | ICD-10-CM | POA: Diagnosis present

## 2012-03-27 DIAGNOSIS — D696 Thrombocytopenia, unspecified: Secondary | ICD-10-CM | POA: Diagnosis present

## 2012-03-27 DIAGNOSIS — I219 Acute myocardial infarction, unspecified: Secondary | ICD-10-CM

## 2012-03-27 DIAGNOSIS — N179 Acute kidney failure, unspecified: Secondary | ICD-10-CM | POA: Diagnosis present

## 2012-03-27 DIAGNOSIS — J9601 Acute respiratory failure with hypoxia: Secondary | ICD-10-CM

## 2012-03-27 DIAGNOSIS — R000 Tachycardia, unspecified: Secondary | ICD-10-CM

## 2012-03-27 DIAGNOSIS — E669 Obesity, unspecified: Secondary | ICD-10-CM | POA: Diagnosis present

## 2012-03-27 DIAGNOSIS — I4891 Unspecified atrial fibrillation: Secondary | ICD-10-CM

## 2012-03-27 DIAGNOSIS — F70 Mild intellectual disabilities: Secondary | ICD-10-CM

## 2012-03-27 DIAGNOSIS — R45851 Suicidal ideations: Secondary | ICD-10-CM

## 2012-03-27 DIAGNOSIS — J189 Pneumonia, unspecified organism: Secondary | ICD-10-CM

## 2012-03-27 DIAGNOSIS — T4275XA Adverse effect of unspecified antiepileptic and sedative-hypnotic drugs, initial encounter: Secondary | ICD-10-CM | POA: Diagnosis present

## 2012-03-27 DIAGNOSIS — G244 Idiopathic orofacial dystonia: Secondary | ICD-10-CM

## 2012-03-27 DIAGNOSIS — F250 Schizoaffective disorder, bipolar type: Secondary | ICD-10-CM

## 2012-03-27 DIAGNOSIS — J45909 Unspecified asthma, uncomplicated: Secondary | ICD-10-CM

## 2012-03-27 DIAGNOSIS — I5033 Acute on chronic diastolic (congestive) heart failure: Secondary | ICD-10-CM | POA: Diagnosis present

## 2012-03-27 DIAGNOSIS — G4700 Insomnia, unspecified: Secondary | ICD-10-CM

## 2012-03-27 DIAGNOSIS — R82998 Other abnormal findings in urine: Secondary | ICD-10-CM

## 2012-03-27 DIAGNOSIS — E871 Hypo-osmolality and hyponatremia: Secondary | ICD-10-CM

## 2012-03-27 DIAGNOSIS — K8020 Calculus of gallbladder without cholecystitis without obstruction: Secondary | ICD-10-CM

## 2012-03-27 DIAGNOSIS — E875 Hyperkalemia: Secondary | ICD-10-CM | POA: Diagnosis present

## 2012-03-27 DIAGNOSIS — D649 Anemia, unspecified: Secondary | ICD-10-CM

## 2012-03-27 DIAGNOSIS — E119 Type 2 diabetes mellitus without complications: Secondary | ICD-10-CM | POA: Diagnosis present

## 2012-03-27 DIAGNOSIS — R0609 Other forms of dyspnea: Secondary | ICD-10-CM

## 2012-03-27 LAB — COMPREHENSIVE METABOLIC PANEL
ALT: 22 U/L (ref 0–35)
AST: 32 U/L (ref 0–37)
Albumin: 3.1 g/dL — ABNORMAL LOW (ref 3.5–5.2)
CO2: 30 mEq/L (ref 19–32)
Calcium: 9.7 mg/dL (ref 8.4–10.5)
Creatinine, Ser: 0.81 mg/dL (ref 0.50–1.10)
Sodium: 135 mEq/L (ref 135–145)
Total Protein: 6.6 g/dL (ref 6.0–8.3)

## 2012-03-27 LAB — CBC WITH DIFFERENTIAL/PLATELET
Basophils Absolute: 0 10*3/uL (ref 0.0–0.1)
Basophils Relative: 0 % (ref 0–1)
Eosinophils Relative: 1 % (ref 0–5)
HCT: 41.4 % (ref 36.0–46.0)
Lymphocytes Relative: 39 % (ref 12–46)
MCHC: 33.3 g/dL (ref 30.0–36.0)
MCV: 101.2 fL — ABNORMAL HIGH (ref 78.0–100.0)
Monocytes Absolute: 0.7 10*3/uL (ref 0.1–1.0)
Platelets: 103 10*3/uL — ABNORMAL LOW (ref 150–400)
RDW: 13.8 % (ref 11.5–15.5)
WBC: 5.1 10*3/uL (ref 4.0–10.5)

## 2012-03-27 LAB — PRO B NATRIURETIC PEPTIDE: Pro B Natriuretic peptide (BNP): 57.7 pg/mL (ref 0–125)

## 2012-03-27 LAB — POCT I-STAT TROPONIN I: Troponin i, poc: 0.01 ng/mL (ref 0.00–0.08)

## 2012-03-27 LAB — D-DIMER, QUANTITATIVE: D-Dimer, Quant: 0.79 ug/mL-FEU — ABNORMAL HIGH (ref 0.00–0.48)

## 2012-03-27 MED ORDER — MORPHINE SULFATE 4 MG/ML IJ SOLN
4.0000 mg | Freq: Once | INTRAMUSCULAR | Status: AC
  Administered 2012-03-27: 4 mg via INTRAVENOUS
  Filled 2012-03-27: qty 1

## 2012-03-27 MED ORDER — GI COCKTAIL ~~LOC~~
30.0000 mL | Freq: Once | ORAL | Status: AC
  Administered 2012-03-27: 30 mL via ORAL
  Filled 2012-03-27: qty 30

## 2012-03-27 NOTE — ED Provider Notes (Signed)
History     CSN: [id number]  Arrival date & time 03/27/12  6578   First MD Initiated Contact with Patient 03/27/12 1821      Chief Complaint  Patient presents with  . Abdominal Pain    (Consider location/radiation/quality/duration/timing/severity/associated sxs/prior treatment) HPI Comments: Patient is a 61 year old female with an extensive past medical history including MI, HTN, morbid obesity and GERD who presents with chest pain that started suddenly about 5 hours ago. The pain started after she ate pigs feet and drinking alcohol. She describes the pain as dull and located substernally. The pain does not radiate. She did not take anything for pain and denies alleviating/aggravating factors. The pain is continuous and currently present. She reports associated SOB. She denies NVD, fever, diaphoresis, dizziness, abdominal pain, numbness/tingling.    Past Medical History  Diagnosis Date  . Schizoaffective disorder   . Urinary tract infection   . Hypertension   . Atrial fibrillation   . GERD (gastroesophageal reflux disease)   . Hypothyroidism   . Myocardial infarction   . Diabetes mellitus   . Obesity   . Asthma   . COPD (chronic obstructive pulmonary disease)   . Pneumonia   . CHF (congestive heart failure)   . Seizures   . Morbid obesity   . Chronic pain     Past Surgical History  Procedure Date  . Tubal ligation   . Abdominal hysterectomy     Family History  Problem Relation Age of Onset  . Heart disease Father   . Cancer Mother 46    Breast    History  Substance Use Topics  . Smoking status: Current Every Day Smoker -- 1.0 packs/day for 12 years    Last Attempt to Quit: 06/29/2000  . Smokeless tobacco: Current User    Types: Snuff, Chew  . Alcohol Use: 0.0 oz/week    OB History    Grav Para Term Preterm Abortions TAB SAB Ect Mult Living                  Review of Systems  Respiratory: Positive for shortness of breath.   Cardiovascular: Positive  for chest pain and leg swelling.  All other systems reviewed and are negative.    Allergies  Sulfamethoxazole w-trimethoprim and Penicillins  Home Medications   Current Outpatient Rx  Name Route Sig Dispense Refill  . ACETAMINOPHEN 325 MG PO TABS Oral Take 650 mg by mouth every 6 (six) hours as needed. For pain    . ALBUTEROL SULFATE HFA 108 (90 BASE) MCG/ACT IN AERS Inhalation Inhale 1 puff into the lungs every 6 (six) hours as needed. For wheezing    . ASPIRIN EC 81 MG PO TBEC Oral Take 81 mg by mouth daily.     Marland Kitchen DIVALPROEX SODIUM ER 500 MG PO TB24 Oral Take 500 mg by mouth 2 (two) times daily.    Marland Kitchen FLUTICASONE PROPIONATE 50 MCG/ACT NA SUSP Nasal Place 2 sprays into the nose daily. For nasal congestion    . FOLIC ACID 1 MG PO TABS Oral Take 1 mg by mouth every morning.     . FUROSEMIDE 20 MG PO TABS Oral Take 20 mg by mouth daily.    Marland Kitchen LEVOTHYROXINE SODIUM 150 MCG PO TABS Oral Take 150 mcg by mouth daily.    Marland Kitchen METOPROLOL SUCCINATE ER 50 MG PO TB24 Oral Take 50 mg by mouth daily. Take with or immediately following a meal.    . OMEPRAZOLE 20  MG PO CPDR Oral Take 20 mg by mouth daily.    Marland Kitchen PALIPERIDONE ER 6 MG PO TB24 Oral Take 6 mg by mouth daily.      BP 129/84  Pulse 90  Temp 98.1 F (36.7 C)  Resp 96  SpO2 96%  Physical Exam  Nursing note and vitals reviewed. Constitutional: She is oriented to person, place, and time. She appears well-developed and well-nourished. No distress.  HENT:  Head: Normocephalic and atraumatic.  Mouth/Throat: No oropharyngeal exudate.  Eyes: Conjunctivae normal and EOM are normal. Pupils are equal, round, and reactive to light. No scleral icterus.  Neck: Normal range of motion. Neck supple.  Cardiovascular: Normal rate and regular rhythm.  Exam reveals no gallop and no friction rub.   No murmur heard. Pulmonary/Chest: Effort normal and breath sounds normal. She has no wheezes. She has no rales. She exhibits no tenderness.  Abdominal: Soft. She  exhibits no distension. There is no tenderness. There is no rebound and no guarding.  Musculoskeletal: Normal range of motion.  Neurological: She is alert and oriented to person, place, and time. Coordination normal.       Speech is goal-oriented. Moves limbs without ataxia.   Skin: Skin is warm and dry. She is not diaphoretic.  Psychiatric: She has a normal mood and affect. Her behavior is normal.    ED Course  Procedures (including critical care time)   Date: 03/27/2012  Rate: 85  Rhythm: normal sinus rhythm  QRS Axis: normal  Intervals: normal  ST/T Wave abnormalities: nonspecific T wave changes  Conduction Disutrbances:none  Narrative Interpretation: nonspecific T wave changes unchanged from 1 month ago  Old EKG Reviewed: unchanged    Labs Reviewed - No data to display No results found.   No diagnosis found.    MDM  7:07 PM Patient will have morphine for pain control. EKG will be done. Labs pending.   8:40 PM Patient has not had morphine yet. I have ordered a GI cocktail and asked the nurse to give the medications ordered.   10:52 PM Patient reports some relief after morphine and GI cocktail. Labs normal. Patient will have a second troponin and d-dimer. If troponin and d-dimer are negative, patient can be discharged.   11:20 PM Second troponin negative.   11:53 PM Elevated d-dimer. Patient will have CT angiogram. Patient signed out to Dr. Freida Busman.   Emilia Beck, [place] 03/30/12 2146

## 2012-03-27 NOTE — ED Provider Notes (Addendum)
Complains of anterior pleuritic chest pain sudden onset 1 PM today pain is constant , worse with deep inspiration by anything treated with 2 sublingual nitroglycerin without relief no other complaint on exam awake alert Glasgow Coma Score 15 lungs clear auscultation heart regular rate and rhythm abdomen morbidly obese extremities with 3+ pretibial pitting edema. Assessment stye.cardiac etiology i.e. highly atypical symptoms negative troponin nonacute EKG`   Doug Sou, MD 03/27/12 2255   Date: 03/28/2012  Rate: 85  Rhythm: normal sinus rhythm  QRS Axis: normal  Intervals: normal  ST/T Wave abnormalities: nonspecific T wave changes  Conduction Disutrbances:none  Narrative Interpretation:   Old EKG Reviewed: Unchanged from 03/09/2012 as interpreted by me Results for orders placed during the hospital encounter of 03/27/12  CBC WITH DIFFERENTIAL      Component Value Range   WBC 5.1  4.0 - 10.5 K/uL   RBC 4.09  3.87 - 5.11 MIL/uL   Hemoglobin 13.8  12.0 - 15.0 g/dL   HCT 16.1  09.6 - 04.5 %   MCV 101.2 (*) 78.0 - 100.0 fL   MCH 33.7  26.0 - 34.0 pg   MCHC 33.3  30.0 - 36.0 g/dL   RDW 40.9  81.1 - 91.4 %   Platelets 103 (*) 150 - 400 K/uL   Neutrophils Relative 48  43 - 77 %   Neutro Abs 2.4  1.7 - 7.7 K/uL   Lymphocytes Relative 39  12 - 46 %   Lymphs Abs 2.0  0.7 - 4.0 K/uL   Monocytes Relative 13 (*) 3 - 12 %   Monocytes Absolute 0.7  0.1 - 1.0 K/uL   Eosinophils Relative 1  0 - 5 %   Eosinophils Absolute 0.0  0.0 - 0.7 K/uL   Basophils Relative 0  0 - 1 %   Basophils Absolute 0.0  0.0 - 0.1 K/uL  COMPREHENSIVE METABOLIC PANEL      Component Value Range   Sodium 135  135 - 145 mEq/L   Potassium 4.1  3.5 - 5.1 mEq/L   Chloride 97  96 - 112 mEq/L   CO2 30  19 - 32 mEq/L   Glucose, Bld 96  70 - 99 mg/dL   BUN 11  6 - 23 mg/dL   Creatinine, Ser 7.82  0.50 - 1.10 mg/dL   Calcium 9.7  8.4 - 95.6 mg/dL   Total Protein 6.6  6.0 - 8.3 g/dL   Albumin 3.1 (*) 3.5 - 5.2 g/dL   AST 32  0 - 37 U/L   ALT 22  0 - 35 U/L   Alkaline Phosphatase 52  39 - 117 U/L   Total Bilirubin 0.3  0.3 - 1.2 mg/dL   GFR calc non Af Amer 79 (*) >90 mL/min   GFR calc Af Amer >90  >90 mL/min  POCT I-STAT TROPONIN I      Component Value Range   Troponin i, poc 0.00  0.00 - 0.08 ng/mL   Comment 3           PRO B NATRIURETIC PEPTIDE      Component Value Range   Pro B Natriuretic peptide (BNP) 57.7  0 - 125 pg/mL  D-DIMER, QUANTITATIVE      Component Value Range   D-Dimer, Quant 0.79 (*) 0.00 - 0.48 ug/mL-FEU  POCT I-STAT TROPONIN I      Component Value Range   Troponin i, poc 0.01  0.00 - 0.08 ng/mL   Comment 3  Dg Chest 2 View  03/27/2012  *RADIOLOGY REPORT*  Clinical Data: Abdominal pain, shortness of breath  CHEST - 2 VIEW  Comparison: 03/21/2012; 02/14/2012  Findings:  Grossly unchanged enlarged cardiac silhouette and mediastinal contours.  Lung volumes remain reduced with grossly unchanged bibasilar heterogeneous opacities.  No definite new focal airspace opacities.  No definite pleural effusion or pneumothorax. Unchanged bones.  IMPRESSION: No acute cardiopulmonary disease.   Original Report Authenticated By: Waynard Reeds, M.D.    Dg Chest 2 View  03/21/2012  *RADIOLOGY REPORT*  Clinical Data: Shortness of breath, chest pain.  CHEST - 2 VIEW  Comparison: 02/29/2012  Findings: Heart size mildly enlarged.  Mild central vascular congestion.  No overt edema.  No focal consolidation, pleural effusion, or pneumothorax.  Multilevel degenerative change.  No acute osseous finding.  IMPRESSION: Heart size mildly enlarged.  No overt edema or focal consolidation.   Original Report Authenticated By: Waneta Martins, M.D.    Ct Angio Chest Pe W/cm &/or Wo Cm  03/28/2012  *RADIOLOGY REPORT*  Clinical Data: 61 year old female with pain radiating to the epigastric area.  CT ANGIOGRAPHY CHEST  Technique:  Multidetector CT imaging of the chest using the standard protocol during bolus  administration of intravenous contrast. Multiplanar reconstructed images including MIPs were obtained and reviewed to evaluate the vascular anatomy.  Contrast: OMNIPAQUE IOHEXOL 350 MG/ML SOLN  Comparison: Chest CTA 01/02/2012.  Findings: Suboptimal contrast bolus timing in the pulmonary arterial tree.  Superimposed respiratory motion artifact. Subsequently, no right pulmonary artery or right hilar, lower lobe pulmonary artery filling defect.  The left pulmonary artery branches are less well evaluated.  The left lower lobe branches appear clear.  Atelectatic changes to the major airway is similar to the prior study.  Cardiomegaly.  No pericardial or pleural effusion.  Mild dependent atelectasis.  No consolidation.  No other acute pulmonary opacity.  Negative visualized upper abdominal viscera.  Negative thoracic inlet.  No mediastinal lymphadenopathy.  Degenerative changes in the spine. No acute osseous abnormality identified.  IMPRESSION: 1.  Suboptimal contrast bolus timing, particularly in the left pulmonary arterial tree.  Doubt left sided pulmonary embolus and no evidence of acute PE on the right.  If clinical suspicion persists, Nuclear Medicine VQ scan recommended. 2.  Cardiomegaly.  Pulmonary atelectasis.   Original Report Authenticated By: Harley Hallmark, M.D.    Dg Chest Portable 1 View  02/29/2012  *RADIOLOGY REPORT*  Clinical Data: Chest pain, shortness of breath.  PORTABLE CHEST - 1 VIEW  Comparison: 02/14/2012  Findings: Cardiomegaly.  Slight interstitial prominence could reflect early interstitial edema, stable since prior study.  No confluent opacities or effusions.  No acute bony abnormality.  IMPRESSION: Stable slight interstitial prominence and cardiomegaly.   Original Report Authenticated By: Cyndie Chime, M.D.     2:15 AM continues to complain of anterior chest pain, pleuritic in nature Morphine ordered CT scan and she'll result reviewed Clinical suspicion of pulmonary embolism  is low Spoke with Dr.Harwani requests admission to try a hospitalist. He reports that he has perform cardiac catheterization on this patient in the past which showed mild coronary disease. Pt signed out to Dr. Freida Busman at 225 am  Dx chest pain    Doug Sou, MD 03/28/12 1884  Doug Sou, MD 03/28/12 1660

## 2012-03-27 NOTE — ED Notes (Signed)
Patient in radiology at this time. 

## 2012-03-27 NOTE — ED Notes (Signed)
C/p upper abd pain radiating into epigastric area after eating pigs feet & drinking alcohol at 1500. Pain started at 1700. Denies n/v/d, SOB.

## 2012-03-28 ENCOUNTER — Emergency Department (HOSPITAL_COMMUNITY): Payer: Medicare Other

## 2012-03-28 ENCOUNTER — Encounter (HOSPITAL_COMMUNITY): Payer: Self-pay | Admitting: Radiology

## 2012-03-28 DIAGNOSIS — R6889 Other general symptoms and signs: Secondary | ICD-10-CM

## 2012-03-28 DIAGNOSIS — I4891 Unspecified atrial fibrillation: Secondary | ICD-10-CM

## 2012-03-28 DIAGNOSIS — E039 Hypothyroidism, unspecified: Secondary | ICD-10-CM

## 2012-03-28 DIAGNOSIS — E119 Type 2 diabetes mellitus without complications: Secondary | ICD-10-CM

## 2012-03-28 DIAGNOSIS — I959 Hypotension, unspecified: Secondary | ICD-10-CM

## 2012-03-28 DIAGNOSIS — J9601 Acute respiratory failure with hypoxia: Secondary | ICD-10-CM

## 2012-03-28 DIAGNOSIS — I10 Essential (primary) hypertension: Secondary | ICD-10-CM

## 2012-03-28 DIAGNOSIS — R079 Chest pain, unspecified: Secondary | ICD-10-CM

## 2012-03-28 DIAGNOSIS — I517 Cardiomegaly: Secondary | ICD-10-CM

## 2012-03-28 LAB — LIPID PANEL
Cholesterol: 164 mg/dL (ref 0–200)
Total CHOL/HDL Ratio: 3 RATIO
Triglycerides: 92 mg/dL (ref <150)
VLDL: 18 mg/dL (ref 0–40)

## 2012-03-28 LAB — CBC
Hemoglobin: 14 g/dL (ref 12.0–15.0)
MCH: 33.7 pg (ref 26.0–34.0)
Platelets: 101 10*3/uL — ABNORMAL LOW (ref 150–400)
RBC: 4.16 MIL/uL (ref 3.87–5.11)

## 2012-03-28 LAB — GLUCOSE, CAPILLARY: Glucose-Capillary: 113 mg/dL — ABNORMAL HIGH (ref 70–99)

## 2012-03-28 LAB — TROPONIN I
Troponin I: <0.3 ng/mL (ref <0.30)
Troponin I: <0.3 ng/mL (ref <0.30)
Troponin I: <0.3 ng/mL (ref <0.30)

## 2012-03-28 LAB — BASIC METABOLIC PANEL
Calcium: 9.8 mg/dL (ref 8.4–10.5)
GFR calc Af Amer: 90 mL/min — ABNORMAL LOW (ref >90)
GFR calc non Af Amer: 77 mL/min — ABNORMAL LOW (ref >90)
Potassium: 3.8 mEq/L (ref 3.5–5.1)
Sodium: 135 mEq/L (ref 135–145)

## 2012-03-28 LAB — LIPASE, BLOOD: Lipase: 40 U/L (ref 11–59)

## 2012-03-28 MED ORDER — BENZONATATE 100 MG PO CAPS
200.0000 mg | ORAL_CAPSULE | Freq: Three times a day (TID) | ORAL | Status: DC | PRN
  Filled 2012-03-28: qty 2

## 2012-03-28 MED ORDER — ACETAMINOPHEN 650 MG RE SUPP: 650.0000 mg | Freq: Four times a day (QID) | RECTAL | Status: DC | PRN

## 2012-03-28 MED ORDER — ASPIRIN EC 81 MG PO TBEC
81.0000 mg | DELAYED_RELEASE_TABLET | Freq: Every day | ORAL | Status: DC
  Administered 2012-03-28 – 2012-03-31 (×4): 81 mg via ORAL
  Filled 2012-03-28 (×4): qty 1

## 2012-03-28 MED ORDER — PNEUMOCOCCAL VAC POLYVALENT 25 MCG/0.5ML IJ INJ
0.5000 mL | INJECTION | INTRAMUSCULAR | Status: AC
  Administered 2012-03-29: 0.5 mL via INTRAMUSCULAR
  Filled 2012-03-28: qty 0.5

## 2012-03-28 MED ORDER — TRAZODONE HCL 50 MG PO TABS
50.0000 mg | ORAL_TABLET | Freq: Every evening | ORAL | Status: DC | PRN
  Administered 2012-03-30 – 2012-03-31 (×2): 50 mg via ORAL
  Filled 2012-03-28 (×2): qty 1

## 2012-03-28 MED ORDER — FLUTICASONE PROPIONATE 50 MCG/ACT NA SUSP
2.0000 | Freq: Every day | NASAL | Status: DC | PRN
  Filled 2012-03-28: qty 16

## 2012-03-28 MED ORDER — FUROSEMIDE 20 MG PO TABS
20.0000 mg | ORAL_TABLET | Freq: Every day | ORAL | Status: DC
  Administered 2012-03-28: 20 mg via ORAL
  Filled 2012-03-28 (×2): qty 1

## 2012-03-28 MED ORDER — ONDANSETRON HCL 4 MG/2ML IJ SOLN: 4.0000 mg | Freq: Four times a day (QID) | INTRAMUSCULAR | Status: DC | PRN

## 2012-03-28 MED ORDER — INSULIN ASPART 100 UNIT/ML ~~LOC~~ SOLN: 0.0000 [IU] | Freq: Three times a day (TID) | SUBCUTANEOUS | Status: DC

## 2012-03-28 MED ORDER — POLYETHYLENE GLYCOL 3350 17 G PO PACK
68.0000 g | PACK | Freq: Once | ORAL | Status: AC
  Administered 2012-03-28: 68 g via ORAL
  Filled 2012-03-28: qty 4

## 2012-03-28 MED ORDER — ENOXAPARIN SODIUM 40 MG/0.4ML ~~LOC~~ SOLN
40.0000 mg | SUBCUTANEOUS | Status: DC
  Administered 2012-03-28 – 2012-03-31 (×4): 40 mg via SUBCUTANEOUS
  Filled 2012-03-28 (×4): qty 0.4

## 2012-03-28 MED ORDER — METOPROLOL SUCCINATE ER 50 MG PO TB24
50.0000 mg | ORAL_TABLET | Freq: Every day | ORAL | Status: DC
  Administered 2012-03-28: 50 mg via ORAL
  Filled 2012-03-28 (×2): qty 1

## 2012-03-28 MED ORDER — FLEET ENEMA 7-19 GM/118ML RE ENEM
1.0000 | ENEMA | Freq: Once | RECTAL | Status: AC
  Administered 2012-03-28: 1 via RECTAL
  Filled 2012-03-28: qty 1

## 2012-03-28 MED ORDER — IOHEXOL 350 MG/ML SOLN
100.0000 mL | Freq: Once | INTRAVENOUS | Status: AC | PRN
  Administered 2012-03-28: 100 mL via INTRAVENOUS

## 2012-03-28 MED ORDER — MORPHINE SULFATE 4 MG/ML IJ SOLN
4.0000 mg | Freq: Once | INTRAMUSCULAR | Status: AC
  Administered 2012-03-28: 4 mg via INTRAVENOUS
  Filled 2012-03-28: qty 1

## 2012-03-28 MED ORDER — NITROGLYCERIN 0.4 MG SL SUBL: 0.4000 mg | SUBLINGUAL_TABLET | SUBLINGUAL | Status: DC | PRN

## 2012-03-28 MED ORDER — OXYCODONE HCL 5 MG PO TABS
15.0000 mg | ORAL_TABLET | ORAL | Status: DC | PRN
  Administered 2012-03-28 – 2012-03-29 (×3): 15 mg via ORAL
  Filled 2012-03-28 (×3): qty 3

## 2012-03-28 MED ORDER — LISINOPRIL 40 MG PO TABS
40.0000 mg | ORAL_TABLET | Freq: Two times a day (BID) | ORAL | Status: DC
  Administered 2012-03-28: 40 mg via ORAL
  Filled 2012-03-28 (×4): qty 1

## 2012-03-28 MED ORDER — ACETAMINOPHEN 325 MG PO TABS
650.0000 mg | ORAL_TABLET | Freq: Four times a day (QID) | ORAL | Status: DC | PRN
  Administered 2012-03-28 – 2012-03-30 (×2): 650 mg via ORAL
  Filled 2012-03-28 (×2): qty 2

## 2012-03-28 MED ORDER — INFLUENZA VIRUS VACC SPLIT PF IM SUSP
0.5000 mL | INTRAMUSCULAR | Status: AC
  Administered 2012-03-29: 0.5 mL via INTRAMUSCULAR
  Filled 2012-03-28: qty 0.5

## 2012-03-28 MED ORDER — MORPHINE SULFATE 2 MG/ML IJ SOLN
0.5000 mg | INTRAMUSCULAR | Status: DC | PRN
  Administered 2012-03-28: 1 mg via INTRAVENOUS
  Filled 2012-03-28: qty 1

## 2012-03-28 MED ORDER — PANTOPRAZOLE SODIUM 40 MG PO TBEC
40.0000 mg | DELAYED_RELEASE_TABLET | Freq: Every day | ORAL | Status: DC
  Administered 2012-03-28 – 2012-03-31 (×3): 40 mg via ORAL
  Filled 2012-03-28 (×3): qty 1

## 2012-03-28 MED ORDER — SODIUM CHLORIDE 0.9 % IJ SOLN
3.0000 mL | Freq: Two times a day (BID) | INTRAMUSCULAR | Status: DC
  Administered 2012-03-28 – 2012-03-30 (×4): 3 mL via INTRAVENOUS

## 2012-03-28 MED ORDER — LEVOTHYROXINE SODIUM 150 MCG PO TABS
150.0000 ug | ORAL_TABLET | Freq: Every day | ORAL | Status: DC
  Administered 2012-03-28 – 2012-03-31 (×4): 150 ug via ORAL
  Filled 2012-03-28 (×4): qty 1

## 2012-03-28 MED ORDER — BISACODYL 5 MG PO TBEC
10.0000 mg | DELAYED_RELEASE_TABLET | Freq: Once | ORAL | Status: AC
  Administered 2012-03-28: 10 mg via ORAL
  Filled 2012-03-28: qty 2

## 2012-03-28 NOTE — H&P (Signed)
PCP:    None Cardiologist: Dr. Sharyn Lull  Chief Complaint:  Chest pains  HPI: This is a 61 year old female who presents with complaints of chest pain since yesterday. It is centrally located and it is worse with deep breathing. She reports palpitations diaphoresis shortness of breath associated with her chest pains. She also said reports a cough nonproductive. Pain is intermittent and ranked a 5/10 at its worse. It radiates towards her left arm. The patient is well-known to her cardiologist Dr.  Sharyn Lull, he states to see her in a.m. Currently patient states his chest pain free  Review of Systems:  The patient denies anorexia, fever, weight loss,, vision loss, decreased hearing, hoarseness, syncope, dyspnea on exertion, peripheral edema, balance deficits, hemoptysis, abdominal pain, melena, hematochezia, severe indigestion/heartburn, hematuria, incontinence, genital sores, muscle weakness, suspicious skin lesions, transient blindness, difficulty walking, depression, unusual weight change, abnormal bleeding, enlarged lymph nodes, angioedema, and breast masses.  Past Medical History: Past Medical History  Diagnosis Date  . Schizoaffective disorder   . Urinary tract infection   . Hypertension   . Atrial fibrillation   . GERD (gastroesophageal reflux disease)   . Hypothyroidism   . Myocardial infarction   . Diabetes mellitus   . Obesity   . Asthma   . COPD (chronic obstructive pulmonary disease)   . Pneumonia   . CHF (congestive heart failure)   . Seizures   . Morbid obesity   . Chronic pain    Past Surgical History  Procedure Date  . Tubal ligation   . Abdominal hysterectomy     Medications: Prior to Admission medications   Medication Sig Start Date End Date Taking? Authorizing Provider  albuterol (PROVENTIL HFA;VENTOLIN HFA) 108 (90 BASE) MCG/ACT inhaler Inhale 1 puff into the lungs every 6 (six) hours as needed. For wheezing   Yes Historical Provider, MD  aspirin EC 81 MG  tablet Take 81 mg by mouth daily.    Yes Historical Provider, MD  benzonatate (TESSALON) 200 MG capsule Take 200 mg by mouth 3 (three) times daily as needed. For cough   Yes Historical Provider, MD  fluticasone (FLONASE) 50 MCG/ACT nasal spray Place 2 sprays into the nose daily as needed. For nasal congestion   Yes Historical Provider, MD  furosemide (LASIX) 20 MG tablet Take 20 mg by mouth daily. 03/04/12 03/04/13 Yes Olivia Mackie, MD  levothyroxine (SYNTHROID, LEVOTHROID) 150 MCG tablet Take 150 mcg by mouth daily.   Yes Historical Provider, MD  lisinopril (PRINIVIL,ZESTRIL) 40 MG tablet Take 40 mg by mouth every 12 (twelve) hours.   Yes Historical Provider, MD  metoprolol succinate (TOPROL-XL) 50 MG 24 hr tablet Take 50 mg by mouth daily. Take with or immediately following a meal.   Yes Historical Provider, MD  omeprazole (PRILOSEC) 20 MG capsule Take 20 mg by mouth daily.   Yes Historical Provider, MD  traMADol (ULTRAM) 50 MG tablet Take 50 mg by mouth every 6 (six) hours as needed. For pain   Yes Historical Provider, MD    Allergies:   Allergies  Allergen Reactions  . Sulfamethoxazole W-Trimethoprim Hives and Nausea And Vomiting  . Penicillins Hives    Social History:  reports that she has been smoking.  Her smokeless tobacco use includes Snuff and Chew. She reports that she drinks alcohol. She reports that she does not use illicit drugs.  Family History: Family History  Problem Relation Age of Onset  . Heart disease Father   . Cancer Mother 76  Breast    Physical Exam: Filed Vitals:   03/27/12 1945 03/27/12 2336 03/28/12 0045 03/28/12 0246  BP: 122/81 118/84 130/84 128/87  Pulse: 83   94  Temp:  97.9 F (36.6 C)  98.7 F (37.1 C)  TempSrc:  Oral  Oral  Resp: 18 20 20 18   SpO2: 95% 96% 98% 94%    General:  Alert and oriented times three, well developed and nourished, no acute distress Eyes: PERRLA, pink conjunctiva, no scleral icterus ENT: Moist oral mucosa, neck  supple, no thyromegaly Lungs: clear to ascultation, no wheeze, no crackles, no use of accessory muscles Cardiovascular: regular rate and rhythm, no regurgitation, no gallops, no murmurs. No carotid bruits, no JVD Abdomen: soft, positive BS, non-tender, non-distended, no organomegaly, not an acute abdomen GU: not examined Neuro: CN II - XII grossly intact, sensation intact Musculoskeletal: strength 5/5 all extremities, no clubbing, cyanosis or edema Skin: no rash, no subcutaneous crepitation, no decubitus Psych: appropriate patient   Labs on Admission:   [facility] 03/27/12 1911  NA 135  K 4.1  CL 97  CO2 30  GLUCOSE 96  BUN 11  CREATININE 0.81  CALCIUM 9.7  MG --  PHOS --    Basename 03/27/12 1911  AST 32  ALT 22  ALKPHOS 52  BILITOT 0.3  PROT 6.6  ALBUMIN 3.1*   No results found for this basename: LIPASE:2,AMYLASE:2 in the last 72 hours  Basename 03/27/12 1911  WBC 5.1  NEUTROABS 2.4  HGB 13.8  HCT 41.4  MCV 101.2*  PLT 103*   No results found for this basename: CKTOTAL:3,CKMB:3,CKMBINDEX:3,TROPONINI:3 in the last 72 hours No components found with this basename: POCBNP:3  Basename 03/27/12 2248  DDIMER 0.79*   No results found for this basename: HGBA1C:2 in the last 72 hours No results found for this basename: CHOL:2,HDL:2,LDLCALC:2,TRIG:2,CHOLHDL:2,LDLDIRECT:2 in the last 72 hours No results found for this basename: TSH,T4TOTAL,FREET3,T3FREE,THYROIDAB in the last 72 hours No results found for this basename: VITAMINB12:2,FOLATE:2,FERRITIN:2,TIBC:2,IRON:2,RETICCTPCT:2 in the last 72 hours  Micro Results: Recent Results (from the past 240 hour(s))  WET PREP, GENITAL     Status: Normal   Collection Time   03/20/12  4:01 PM      Component Value Range Status Comment   Yeast Wet Prep HPF POC NONE SEEN  NONE SEEN Final    Trich, Wet Prep NONE SEEN  NONE SEEN Final    Clue Cells Wet Prep HPF POC NONE SEEN  NONE SEEN Final    WBC, Wet Prep HPF POC NONE SEEN  NONE  SEEN Final      Radiological Exams on Admission: Dg Chest 2 View  03/27/2012  *RADIOLOGY REPORT*  Clinical Data: Abdominal pain, shortness of breath  CHEST - 2 VIEW  Comparison: 03/21/2012; 02/14/2012  Findings:  Grossly unchanged enlarged cardiac silhouette and mediastinal contours.  Lung volumes remain reduced with grossly unchanged bibasilar heterogeneous opacities.  No definite new focal airspace opacities.  No definite pleural effusion or pneumothorax. Unchanged bones.  IMPRESSION: No acute cardiopulmonary disease.   Original Report Authenticated By: Waynard Reeds, M.D.    Ct Angio Chest Pe W/cm &/or Wo Cm  03/28/2012  *RADIOLOGY REPORT*  Clinical Data: 61 year old female with pain radiating to the epigastric area.  CT ANGIOGRAPHY CHEST  Technique:  Multidetector CT imaging of the chest using the standard protocol during bolus administration of intravenous contrast. Multiplanar reconstructed images including MIPs were obtained and reviewed to evaluate the vascular anatomy.  Contrast: OMNIPAQUE IOHEXOL  350 MG/ML SOLN  Comparison: Chest CTA 01/02/2012.  Findings: Suboptimal contrast bolus timing in the pulmonary arterial tree.  Superimposed respiratory motion artifact. Subsequently, no right pulmonary artery or right hilar, lower lobe pulmonary artery filling defect.  The left pulmonary artery branches are less well evaluated.  The left lower lobe branches appear clear.  Atelectatic changes to the major airway is similar to the prior study.  Cardiomegaly.  No pericardial or pleural effusion.  Mild dependent atelectasis.  No consolidation.  No other acute pulmonary opacity.  Negative visualized upper abdominal viscera.  Negative thoracic inlet.  No mediastinal lymphadenopathy.  Degenerative changes in the spine. No acute osseous abnormality identified.  IMPRESSION: 1.  Suboptimal contrast bolus timing, particularly in the left pulmonary arterial tree.  Doubt left sided pulmonary embolus and no  evidence of acute PE on the right.  If clinical suspicion persists, Nuclear Medicine VQ scan recommended. 2.  Cardiomegaly.  Pulmonary atelectasis.   Original Report Authenticated By: Harley Hallmark, M.D.     EKG: Junctional rhythm rate 85  Assessment/Plan Present on Admission:  Chest pains Admit for 23 hour observation telemetry Cycle cardiac enzymes, lipid panel in a.m., continue  aspirin Consult cardiologist Dr. Sharyn Lull to see patient in the a.m.   .OBSTRUCTIVE SLEEP APNEA .OBESITY .Schizoaffective disorder, bipolar type Stable, resume home medications  Full code DVT prophylaxis  Lorette Peterkin 03/28/2012, 3:33 AM

## 2012-03-28 NOTE — Progress Notes (Signed)
  Echocardiogram 2D Echocardiogram has been performed.  Nadyne Gariepy FRANCES 03/28/2012, 6:27 PM

## 2012-03-28 NOTE — Care Management Note (Signed)
    Page 1 of 1   03/28/2012     4:57:53 PM   CARE MANAGEMENT NOTE 03/28/2012  Patient:  [name], [name]   Account Number:  [id number]  Date Initiated:  03/28/2012  Documentation initiated by:  GRAVES-BIGELOW,Cornie Herrington  Subjective/Objective Assessment:   Pt admitted with cp.     Action/Plan:   CM will continue to monitor for disposition needs.   Anticipated DC Date:  03/29/2012   Anticipated DC Plan:  HOME/SELF CARE         Choice offered to / List presented to:             Status of service:  Completed, signed off Medicare Important Message given?   (If response is "NO", the following Medicare IM given date fields will be blank) Date Medicare IM given:   Date Additional Medicare IM given:    Discharge Disposition:  HOME/SELF CARE  Per UR Regulation:  Reviewed for med. necessity/level of care/duration of stay  If discussed at Long Length of Stay Meetings, dates discussed:    Comments:

## 2012-03-28 NOTE — ED Notes (Signed)
Patient transported to CT 

## 2012-03-28 NOTE — ED Notes (Signed)
Patient states that she is ready to go home at this time.  Informed patient of plan of care and reason for delay.  Patient requesting to see the MD for her test results so she can go home.  EDP Jacubowitz and RN Shanda Bumps informed.

## 2012-03-28 NOTE — Progress Notes (Signed)
Pt stated at the beginning of out conversation that she wore a FFM at home but did not know her settings. But then towards the end of our conversation she was stating the she did not have a machine at home and she was going to try and get one. I attempted to clarify with pt but was unable to do so. I spoke with RN and RN stated this has been normal. At this time I left pt on FFM due to the fact that pt is a mouth breather.

## 2012-03-28 NOTE — ED Notes (Signed)
JACUBOWITZ, SAM at bedside

## 2012-03-28 NOTE — Progress Notes (Addendum)
[facility] PROGRESS NOTE  [name] EAV:[id number] DOB: December 21, 1950 DOA: 03/27/2012 PCP: No primary provider on file.  Assessment/Plan:  Chest pain.  CTa not suggestive of PE, however, did show cardiomegaly and pulmonary atelectasis.  Troponins <0.3 x 2.  Spoke with Dr. Sharyn Lull regarding further management who recommended outpatient follow up as [name] has recently had a catheterization and stress test.   -  Continue asa 81mg  -  LDL at goal   -  Hemoglobin a1c from less than 3 months ago was 5.4 -  Follow up 3rd troponin and follow up ECG. -  Continue protonix and maalox.  Shortness of breath with cough and desaturations overnight.  No evidence of pneumonia on CT, however, did have atelectasis and patient has OSA.  Known depressed EF of 47% on last NM study in 12/2011. -  CPAP overnight -  Incentive spirometry -  ECHO    Abdominal pain, recurrent.  LFTs within normal limits.  Patient has had multiple lipase reported, all wnl. Likely due to constipation given history.   -  Add lipase  -  Enema and miralax and bisacodyl  Pain control, patient does not appear uncomfortable despite 10/10 pain.   -  Tylenol with Oxycodone  -  Bowel regimen as above  Hypotension, resolving.  May have been side effect of medication.  Patient received blood pressure medications this morning.  Will monitor overnight and if BP stable, may be able to discharge home in AM.    Schizoaffective disorder/bipolar type, currently not on any medications although patient states [name] was previously on seroquel, trazodone, and risperdol.  Defer to outpatient management.   -  Trazodone 50mg  qhs prn sleep  Thrombocytopenia, recurrent.  No signs of bleeding or need for transfusion. - follow up with PCP  Macrocytosis without anemia.  Consider medication induced or vitamin deficiency. Follow up with PCP.    DIET: Healthy heart ACCESS:  PIV IVF:  NOne PROPH:  lovenox  Code Status: Full Family Communication:  Patient at bedside Disposition Plan: Pending completion of troponins x 3, improvement in shortness of breath, likely to home in AM.    HPI:  This is a 61 year old female who presents with complaints of chest pain since yesterday. It is centrally located and it is worse with deep breathing. [name] reports palpitations diaphoresis shortness of breath associated with her chest pains. [name] also said reports a cough nonproductive. Pain is intermittent and ranked a 5/10 at its worse. It radiates towards her left arm. The patient is well-known to her cardiologist Dr. Sharyn Lull, he states to see her in a.m. Currently patient states his chest pain free   Consultants:  Spoke with Dr. Sharyn Lull who reviewed records and recommended outpatient follow up  Procedures:  CTa chest  Antibiotics:  None  HPI/Subjective:  States [name] has not had a bowel movement in more than 5 days and has had some leakage of liquid stool for the last couple of days.  [name] continues to have 10/10 chest pain with some shortness of breath and cough that is productive of green sputum.  [name] also has bilateral lower quadrant abdominal pain and fullness sensation.    Objective: Filed Vitals:   03/28/12 0530 03/28/12 0620 03/28/12 0621 03/28/12 1017  BP: 93/49 133/104 138/98 100/69  Pulse: 90 103  82  Temp:  98 F (36.7 C)    TempSrc:  Oral    Resp: 13 18    Height:  5\' 1"  (1.549 m)  Weight:  153.497 kg (338 lb 6.4 oz)    SpO2: 86% 92%      Intake/Output Summary (Last 24 hours) at 03/28/12 1358 Last data filed at 03/28/12 1130  Gross per 24 hour  Intake    600 ml  Output    550 ml  Net     50 ml   Filed Weights   03/28/12 0620  Weight: 153.497 kg (338 lb 6.4 oz)    Exam:  General: Alert and oriented times three, well developed and nourished, no acute distress.  Pleasant and interactive with reported 10/10 pain. Eyes: PERRLA, pink conjunctiva, no scleral icterus  ENT: Moist oral mucosa Lungs: clear to ascultation,  no wheeze, no crackles, no use of accessory muscles  Cardiovascular: regular rate and rhythm, no regurgitation, no gallops, no murmurs. No carotid bruits, no JVD  Abdomen: soft, positive BS, mildly tender in bilateral lower quadrants without rebound or guarding.  Feels full.  Neuro: CN II - XII grossly intact, sensation intact  Musculoskeletal: strength 5/5 all extremities.  2+ pitting edema of bilateral feet.   Data Reviewed: Basic Metabolic Panel:  Lab 03/28/12 1610 03/27/12 1911  NA 135 135  K 3.8 4.1  CL 97 97  CO2 31 30  GLUCOSE 101* 96  BUN 12 11  CREATININE 0.82 0.81  CALCIUM 9.8 9.7  MG -- --  PHOS -- --   Liver Function Tests:  Lab 03/27/12 1911  AST 32  ALT 22  ALKPHOS 52  BILITOT 0.3  PROT 6.6  ALBUMIN 3.1*   No results found for this basename: LIPASE:5,AMYLASE:5 in the last 168 hours No results found for this basename: AMMONIA:5 in the last 168 hours CBC:  Lab 03/28/12 0447 03/27/12 1911  WBC 6.4 5.1  NEUTROABS -- 2.4  HGB 14.0 13.8  HCT 42.3 41.4  MCV 101.7* 101.2*  PLT 101* 103*   Cardiac Enzymes:  Lab 03/28/12 1102 03/28/12 0447  CKTOTAL -- --  CKMB -- --  CKMBINDEX -- --  TROPONINI <0.30 <0.30   BNP (last 3 results)  Basename 03/27/12 2102 03/04/12 0519 02/08/12 2324  PROBNP 57.7 103.2 44.3   CBG:  Lab 03/28/12 1125 03/28/12 0748  GLUCAP 113* 95    Recent Results (from the past 240 hour(s))  WET PREP, GENITAL     Status: Normal   Collection Time   03/20/12  4:01 PM      Component Value Range Status Comment   Yeast Wet Prep HPF POC NONE SEEN  NONE SEEN Final    Trich, Wet Prep NONE SEEN  NONE SEEN Final    Clue Cells Wet Prep HPF POC NONE SEEN  NONE SEEN Final    WBC, Wet Prep HPF POC NONE SEEN  NONE SEEN Final      Studies: Dg Chest 2 View  03/27/2012  *RADIOLOGY REPORT*  Clinical Data: Abdominal pain, shortness of breath  CHEST - 2 VIEW  Comparison: 03/21/2012; 02/14/2012  Findings:  Grossly unchanged enlarged cardiac  silhouette and mediastinal contours.  Lung volumes remain reduced with grossly unchanged bibasilar heterogeneous opacities.  No definite new focal airspace opacities.  No definite pleural effusion or pneumothorax. Unchanged bones.  IMPRESSION: No acute cardiopulmonary disease.   Original Report Authenticated By: Waynard Reeds, M.D.    Ct Angio Chest Pe W/cm &/or Wo Cm  03/28/2012  *RADIOLOGY REPORT*  Clinical Data: 61 year old female with pain radiating to the epigastric area.  CT ANGIOGRAPHY CHEST  Technique:  Multidetector CT  imaging of the chest using the standard protocol during bolus administration of intravenous contrast. Multiplanar reconstructed images including MIPs were obtained and reviewed to evaluate the vascular anatomy.  Contrast: OMNIPAQUE IOHEXOL 350 MG/ML SOLN  Comparison: Chest CTA 01/02/2012.  Findings: Suboptimal contrast bolus timing in the pulmonary arterial tree.  Superimposed respiratory motion artifact. Subsequently, no right pulmonary artery or right hilar, lower lobe pulmonary artery filling defect.  The left pulmonary artery branches are less well evaluated.  The left lower lobe branches appear clear.  Atelectatic changes to the major airway is similar to the prior study.  Cardiomegaly.  No pericardial or pleural effusion.  Mild dependent atelectasis.  No consolidation.  No other acute pulmonary opacity.  Negative visualized upper abdominal viscera.  Negative thoracic inlet.  No mediastinal lymphadenopathy.  Degenerative changes in the spine. No acute osseous abnormality identified.  IMPRESSION: 1.  Suboptimal contrast bolus timing, particularly in the left pulmonary arterial tree.  Doubt left sided pulmonary embolus and no evidence of acute PE on the right.  If clinical suspicion persists, Nuclear Medicine VQ scan recommended. 2.  Cardiomegaly.  Pulmonary atelectasis.   Original Report Authenticated By: Harley Hallmark, M.D.     Scheduled Meds:   . aspirin EC  81 mg Oral  Daily  . bisacodyl  10 mg Oral Once  . enoxaparin (LOVENOX) injection  40 mg Subcutaneous Q24H  . furosemide  20 mg Oral Daily  . gi cocktail  30 mL Oral Once  . influenza  inactive virus vaccine  0.5 mL Intramuscular Tomorrow-1000  . levothyroxine  150 mcg Oral Daily  . lisinopril  40 mg Oral Q12H  . metoprolol succinate  50 mg Oral Daily  . morphine  4 mg Intravenous Once  . morphine  4 mg Intravenous Once  .  morphine injection  4 mg Intravenous Once  . pantoprazole  40 mg Oral Q1200  . pneumococcal 23 valent vaccine  0.5 mL Intramuscular Tomorrow-1000  . polyethylene glycol  68 g Oral Once  . sodium chloride  3 mL Intravenous Q12H  . sodium phosphate  1 enema Rectal Once   Continuous Infusions:   Active Problems:  OBESITY  OBSTRUCTIVE SLEEP APNEA  CHEST PAIN, HX OF  Schizoaffective disorder, bipolar type  Hypertension  Atrial fibrillation  Hypothyroidism  Diabetes mellitus    Time spent: 30    Lysle Yero, [facility]  [facility] Pager [phone]. If 8PM-8AM, please contact night-coverage at [email or web address], password [facility] 03/28/2012, 1:58 PM  LOS: 1 day

## 2012-03-28 NOTE — ED Notes (Signed)
Patient returned from CT

## 2012-03-28 NOTE — ED Notes (Signed)
Pt return from ct scan 

## 2012-03-29 ENCOUNTER — Observation Stay (HOSPITAL_COMMUNITY): Payer: Medicare Other

## 2012-03-29 DIAGNOSIS — G8929 Other chronic pain: Secondary | ICD-10-CM

## 2012-03-29 DIAGNOSIS — K8020 Calculus of gallbladder without cholecystitis without obstruction: Secondary | ICD-10-CM

## 2012-03-29 DIAGNOSIS — I4891 Unspecified atrial fibrillation: Secondary | ICD-10-CM

## 2012-03-29 DIAGNOSIS — D649 Anemia, unspecified: Secondary | ICD-10-CM

## 2012-03-29 DIAGNOSIS — J9600 Acute respiratory failure, unspecified whether with hypoxia or hypercapnia: Principal | ICD-10-CM

## 2012-03-29 DIAGNOSIS — I219 Acute myocardial infarction, unspecified: Secondary | ICD-10-CM

## 2012-03-29 LAB — BLOOD GAS, ARTERIAL
Acid-Base Excess: 3.1 mmol/L — ABNORMAL HIGH (ref 0.0–2.0)
Acid-Base Excess: 1.9 mmol/L (ref 0.0–2.0)
Delivery systems: POSITIVE
Drawn by: 244801
Drawn by: 27733
Drawn by: 270221
Expiratory PAP: 6
Inspiratory PAP: 12
O2 Content: 2.5 L/min
O2 Saturation: 92.6 %
O2 Saturation: 96.9 %
Patient temperature: 98.6
TCO2: 30.5 mmol/L (ref 0–100)
pCO2 arterial: 57.9 mmHg (ref 35.0–45.0)
pCO2 arterial: 59.1 mmHg (ref 35.0–45.0)
pO2, Arterial: 72 mmHg — ABNORMAL LOW (ref 80.0–100.0)
pO2, Arterial: 96.9 mmHg (ref 80.0–100.0)
pO2, Arterial: 83.1 mmHg (ref 80.0–100.0)

## 2012-03-29 LAB — COMPREHENSIVE METABOLIC PANEL
BUN: 19 mg/dL (ref 6–23)
Calcium: 9.7 mg/dL (ref 8.4–10.5)
GFR calc Af Amer: 28 mL/min — ABNORMAL LOW (ref >90)
Glucose, Bld: 104 mg/dL — ABNORMAL HIGH (ref 70–99)
Sodium: 134 mEq/L — ABNORMAL LOW (ref 135–145)
Total Protein: 6.7 g/dL (ref 6.0–8.3)

## 2012-03-29 LAB — URINALYSIS, ROUTINE W REFLEX MICROSCOPIC
Glucose, UA: NEGATIVE mg/dL
Hgb urine dipstick: NEGATIVE
Specific Gravity, Urine: 1.027 (ref 1.005–1.030)
pH: 5 (ref 5.0–8.0)

## 2012-03-29 LAB — CBC WITH DIFFERENTIAL/PLATELET
Basophils Relative: 0 % (ref 0–1)
Eosinophils Absolute: 0.1 10*3/uL (ref 0.0–0.7)
Eosinophils Relative: 1 % (ref 0–5)
Lymphs Abs: 1.4 10*3/uL (ref 0.7–4.0)
MCH: 33.7 pg (ref 26.0–34.0)
MCHC: 32 g/dL (ref 30.0–36.0)
MCV: 105.3 fL — ABNORMAL HIGH (ref 78.0–100.0)
Monocytes Relative: 16 % — ABNORMAL HIGH (ref 3–12)
Platelets: 105 10*3/uL — ABNORMAL LOW (ref 150–400)
RBC: 3.98 MIL/uL (ref 3.87–5.11)

## 2012-03-29 LAB — PHOSPHORUS: Phosphorus: 5.5 mg/dL — ABNORMAL HIGH (ref 2.3–4.6)

## 2012-03-29 LAB — URINE MICROSCOPIC-ADD ON

## 2012-03-29 LAB — BASIC METABOLIC PANEL
Calcium: 10 mg/dL (ref 8.4–10.5)
Creatinine, Ser: 1.59 mg/dL — ABNORMAL HIGH (ref 0.50–1.10)
GFR calc Af Amer: 40 mL/min — ABNORMAL LOW (ref >90)
GFR calc non Af Amer: 35 mL/min — ABNORMAL LOW (ref >90)
Sodium: 134 mEq/L — ABNORMAL LOW (ref 135–145)

## 2012-03-29 LAB — GLUCOSE, CAPILLARY
Glucose-Capillary: 145 mg/dL — ABNORMAL HIGH (ref 70–99)
Glucose-Capillary: 143 mg/dL — ABNORMAL HIGH (ref 70–99)

## 2012-03-29 IMAGING — CR DG ABDOMEN ACUTE W/ 1V CHEST
1 series · 1 of 1 positions shown · non-contrast
Comparison: None.

CLINICAL DATA: Vomiting with chest and abdominal pain for 3 hours.

ACUTE ABDOMEN SERIES (ABDOMEN 2 VIEW & CHEST 1 VIEW)

[view not recorded]
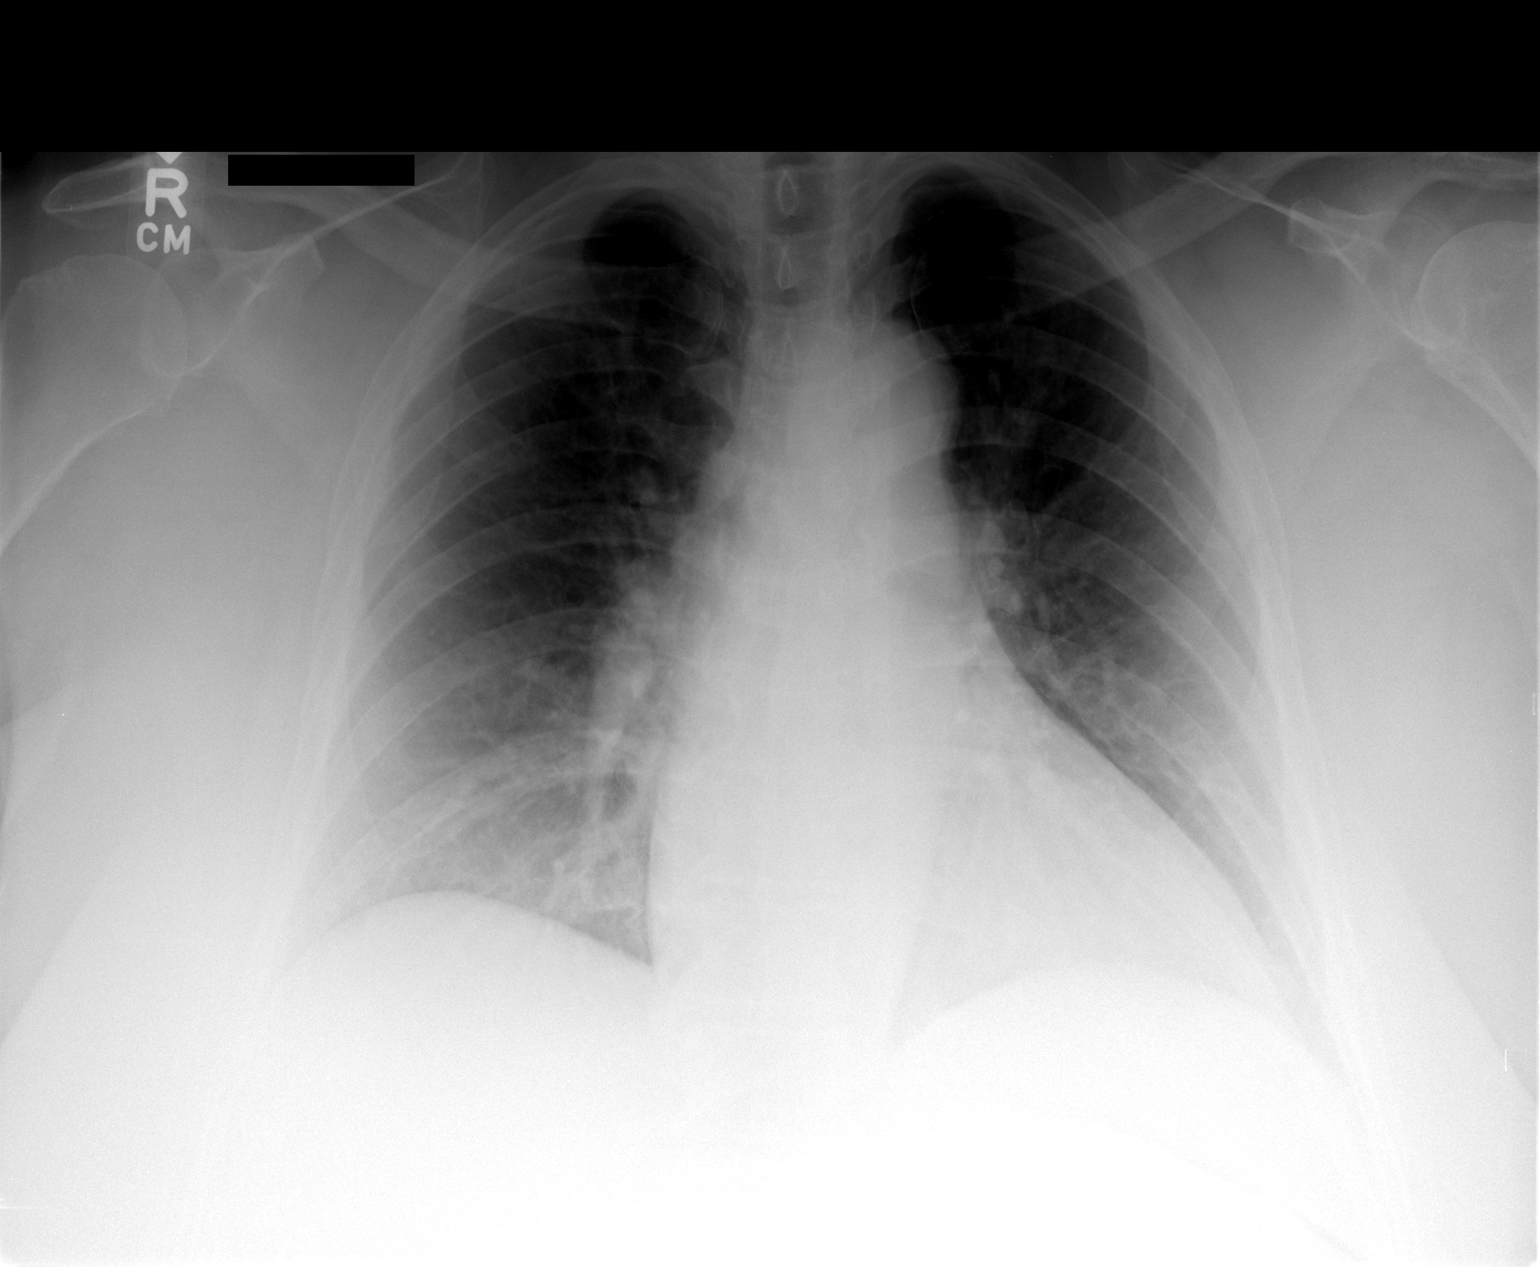

[1 of 1 positions shown; findings below may reference images not displayed]

FINDINGS: Cardiopericardial silhouette and mediastinal contours are
within normal limits.  No airspace disease or effusion.  No free
air underneath the hemidiaphragms.  Bowel gas pattern is within
normal limits with prominent stool in the ascending colon.
Cholecystectomy clips are present in the right upper quadrant.
Injection granuloma are present over the left flank.  Stool and
bowel gas extend to the rectosigmoid.  No dilated loops of large or
small bowel.  Decubitus view is suboptimal secondary to patient
body habitus.
IMPRESSION: No active cardiopulmonary disease.  Normal bowel gas pattern aside
from prominent ascending colonic stool burden.

## 2012-03-29 MED ORDER — NALOXONE HCL 0.4 MG/ML IJ SOLN
0.4000 mg | INTRAMUSCULAR | Status: DC | PRN
  Administered 2012-03-29 (×2): 0.4 mg via INTRAVENOUS
  Filled 2012-03-29: qty 1

## 2012-03-29 MED ORDER — ALBUTEROL SULFATE (5 MG/ML) 0.5% IN NEBU: 2.5000 mg | INHALATION_SOLUTION | RESPIRATORY_TRACT | Status: DC | PRN

## 2012-03-29 MED ORDER — CHLORHEXIDINE GLUCONATE 0.12 % MT SOLN
15.0000 mL | Freq: Two times a day (BID) | OROMUCOSAL | Status: DC
  Administered 2012-03-29 – 2012-03-31 (×3): 15 mL via OROMUCOSAL
  Filled 2012-03-29 (×6): qty 15

## 2012-03-29 MED ORDER — BIOTENE DRY MOUTH MT LIQD
15.0000 mL | Freq: Two times a day (BID) | OROMUCOSAL | Status: DC
  Administered 2012-03-30 – 2012-03-31 (×3): 15 mL via OROMUCOSAL

## 2012-03-29 MED ORDER — NALOXONE HCL 0.4 MG/ML IJ SOLN: 0.4000 mg | INTRAMUSCULAR | Status: DC | PRN

## 2012-03-29 MED ORDER — SODIUM CHLORIDE 0.9 % IV SOLN: INTRAVENOUS | Status: DC

## 2012-03-29 MED ORDER — METHYLPREDNISOLONE SODIUM SUCC 125 MG IJ SOLR
60.0000 mg | Freq: Two times a day (BID) | INTRAMUSCULAR | Status: DC
  Administered 2012-03-29 – 2012-03-30 (×2): 60 mg via INTRAVENOUS
  Filled 2012-03-29 (×4): qty 0.96

## 2012-03-29 MED ORDER — INSULIN ASPART 100 UNIT/ML ~~LOC~~ SOLN
0.0000 [IU] | SUBCUTANEOUS | Status: DC
  Administered 2012-03-30: 1 [IU] via SUBCUTANEOUS
  Administered 2012-03-30: 2 [IU] via SUBCUTANEOUS

## 2012-03-29 MED ORDER — SODIUM CHLORIDE 0.9 % IV BOLUS (SEPSIS): 1000.0000 mL | Freq: Once | INTRAVENOUS | Status: DC

## 2012-03-29 MED ORDER — TRAMADOL HCL 50 MG PO TABS
50.0000 mg | ORAL_TABLET | Freq: Four times a day (QID) | ORAL | Status: DC | PRN
  Filled 2012-03-29: qty 1

## 2012-03-29 MED ORDER — IPRATROPIUM BROMIDE 0.02 % IN SOLN
0.5000 mg | RESPIRATORY_TRACT | Status: DC
  Administered 2012-03-29 – 2012-03-30 (×5): 0.5 mg via RESPIRATORY_TRACT
  Filled 2012-03-29 (×4): qty 2.5

## 2012-03-29 MED ORDER — LEVOFLOXACIN IN D5W 750 MG/150ML IV SOLN
750.0000 mg | INTRAVENOUS | Status: DC
  Administered 2012-03-29: 750 mg via INTRAVENOUS
  Filled 2012-03-29 (×2): qty 150

## 2012-03-29 MED ORDER — NALOXONE HCL 0.4 MG/ML IJ SOLN
INTRAMUSCULAR | Status: AC
  Administered 2012-03-29: 0.4 mg via INTRAVENOUS
  Filled 2012-03-29: qty 1

## 2012-03-29 MED ORDER — ALBUTEROL SULFATE (5 MG/ML) 0.5% IN NEBU
2.5000 mg | INHALATION_SOLUTION | RESPIRATORY_TRACT | Status: DC
  Administered 2012-03-29 – 2012-03-30 (×5): 2.5 mg via RESPIRATORY_TRACT
  Filled 2012-03-29 (×6): qty 0.5

## 2012-03-29 NOTE — Progress Notes (Signed)
Patient placed on 3L [employer] post receiving her breathing treatment. She requested to get a break from the BIPAP. Peak Flow 200 pre tx and 290 post tx. All her vital signs are within normal range and she denies any SOB or respiratory distress at this time. [name] RRT,RCP

## 2012-03-29 NOTE — Progress Notes (Signed)
Patient and patient's fiance counseled on [facility] campus being tobacco-free facility and that any tobacco products in the room need to be taken off campus.  Patient and patient's fiance verbally agreed to not have anymore tobacco products on campus.  RN also counseled patient on benefits of stopping use of chewing tobacco and health benefits associated with leading a tobacco-free lifestyle. [name]

## 2012-03-29 NOTE — Progress Notes (Signed)
CRITICAL VALUE ALERT  Critical value received:pcO2 59.1 Date of notification:  03/29/2012  Time of notification: 1731  Critical value read back:yes  Nurse who received alert:  Deneise Lever, RN  MD notified (1st page):  Dr. Judie Petit. Short  Time of first page:  1735  MD notified (2nd page):  Time of second page:  Responding MD:  Dr. Judie Petit. Short  Time MD responded:  1740

## 2012-03-29 NOTE — Progress Notes (Signed)
Patient was taken off of Bipap by rapid response RN, Hella after she received Narcan and became more arousable. She was placed on nasal cannula at 2lpm. When patient was taken off of Bipap she stated that she "had something in her mouth" per rapid response RN and then she spit out some "chewing tobacco". RT, Rapid response RN, and unit RN were unaware that patient had chewing tobacco in her mouth and at no time had any indications of airway obstruction. She had good air movement prior to BIPAP and while she was on it. Dr. Malachi Bonds is present and aware. Patient does not appear to have aspirated any of it and is currently in no apparent acute respiratory distress. ABG will be obtained on nasal cannula. Patient is stable and is currently maintaining well on nasal cannula. RT will continue to monitor.

## 2012-03-29 NOTE — Progress Notes (Signed)
03/29/12 2348  BiPAP/CPAP/SIPAP  BiPAP/CPAP/SIPAP Pt Type Adult  Mask Type Full face mask  Mask Size Medium  Set Rate 10 breaths/min  Respiratory Rate 13 breaths/min  IPAP 10 cmH20  EPAP 5 cmH2O  Oxygen Percent 35 %  Minute Ventilation 8.5   Tidal Volume (Vt) 647   BiPAP/CPAP/SIPAP BiPAP  Patient Home Equipment No  Auto Titrate No   Patient placed back on BIPAP

## 2012-03-29 NOTE — Consult Note (Signed)
Name: [name] MRN: [id number] DOB: Sep 15, 1950    LOS: 2  [place] [facility]   History of Present Illness:  61 y/o F, tobacco chewer, who presented to [facility] on 10/9 with a two day history of chest pain -Followed by Dr. Sharyn Lull.  Admitted for 23 hour observation.  Cardiac work up negative.  10/10 PCCM called for somnolence & AMS.  Noted patient had morphine & Oxy IR in last 24 hours.  Pt given Narcan with resolution in mental status.     PMH of DM, Obesity, OSA, CHF, Afib, HTN, Seizures, Chronic Pain, Schizoaffective Disorder  Cultures: 10/10 UC>>>   Antibiotics: Levaquin 10/10>>>  Tests / Events: 10/9 CTA Chest>>>Suboptimal contrast bolus timing, particularly in the left pulmonary arterial tree. Doubt left sided pulmonary embolus and no evidence of acute PE on the right. If clinical suspicion persists, Nuclear Medicine VQ scan recommended. Cardiomegaly. Pulmonary atelectasis.   Past Medical History  Diagnosis Date  . Schizoaffective disorder   . Urinary tract infection   . Hypertension   . Atrial fibrillation   . GERD (gastroesophageal reflux disease)   . Hypothyroidism   . Myocardial infarction   . Diabetes mellitus   . Obesity   . Asthma   . COPD (chronic obstructive pulmonary disease)   . Pneumonia   . CHF (congestive heart failure)   . Seizures   . Morbid obesity   . Chronic pain     Past Surgical History  Procedure Date  . Tubal ligation   . Abdominal hysterectomy     Prior to Admission medications   Medication Sig Start Date End Date Taking? Authorizing Provider  albuterol (PROVENTIL HFA;VENTOLIN HFA) 108 (90 BASE) MCG/ACT inhaler Inhale 1 puff into the lungs every 6 (six) hours as needed. For wheezing   Yes Historical Provider, MD  aspirin EC 81 MG tablet Take 81 mg by mouth daily.    Yes Historical Provider, MD  benzonatate (TESSALON) 200 MG capsule Take 200 mg by mouth 3 (three) times daily as needed. For cough   Yes  Historical Provider, MD  fluticasone (FLONASE) 50 MCG/ACT nasal spray Place 2 sprays into the nose daily as needed. For nasal congestion   Yes Historical Provider, MD  furosemide (LASIX) 20 MG tablet Take 20 mg by mouth daily. 03/04/12 03/04/13 Yes Olivia Mackie, MD  levothyroxine (SYNTHROID, LEVOTHROID) 150 MCG tablet Take 150 mcg by mouth daily.   Yes Historical Provider, MD  lisinopril (PRINIVIL,ZESTRIL) 40 MG tablet Take 40 mg by mouth every 12 (twelve) hours.   Yes Historical Provider, MD  metoprolol succinate (TOPROL-XL) 50 MG 24 hr tablet Take 50 mg by mouth daily. Take with or immediately following a meal.   Yes Historical Provider, MD  omeprazole (PRILOSEC) 20 MG capsule Take 20 mg by mouth daily.   Yes Historical Provider, MD  traMADol (ULTRAM) 50 MG tablet Take 50 mg by mouth every 6 (six) hours as needed. For pain   Yes Historical Provider, MD    Allergies Allergies  Allergen Reactions  . Sulfamethoxazole W-Trimethoprim Hives and Nausea And Vomiting  . Penicillins Hives    Family History Family History  Problem Relation Age of Onset  . Heart disease Father   . Cancer Mother 34    Breast    Social History  reports that she has been smoking.  Her smokeless tobacco use includes Snuff and Chew. She reports that she drinks alcohol. She reports that she does not use  illicit drugs.  Review Of Systems:  Gen: Denies fever, chills, weight change, fatigue, night sweats HEENT: Denies blurred vision, double vision, hearing loss, tinnitus, sinus congestion, rhinorrhea, sore throat, neck stiffness, dysphagia PULM: Denies shortness of breath, cough, sputum production, hemoptysis, wheezing CV: Denies chest pain, edema, orthopnea, paroxysmal nocturnal dyspnea, palpitations GI: Denies abdominal pain, nausea, vomiting, diarrhea, hematochezia, melena, constipation, change in bowel habits GU: Denies dysuria, hematuria, polyuria, oliguria, urethral discharge Endocrine: Denies hot or cold  intolerance, polyuria, polyphagia or appetite change Derm: Denies rash, dry skin, scaling or peeling skin change Heme: Denies easy bruising, bleeding, bleeding gums Neuro: Denies headache, numbness, weakness, slurred speech, loss of memory or consciousness  Vital Signs: Temp:  [97.5 F (36.4 C)-98.3 F (36.8 C)] 98.3 F (36.8 C) (10/10 1359) Pulse Rate:  [52-91] 91  (10/10 1359) Resp:  [8-20] 18  (10/10 1359) BP: (81-129)/(37-90) 129/90 mmHg (10/10 1359) SpO2:  [93 %-100 %] 93 % (10/10 1359) FiO2 (%):  [35 %] 35 % (10/10 1142) I/O last 3 completed shifts: In: 1320 [P.O.:1320] Out: 552 [Urine:551; Stool:1]  Physical Examination: General: chronically ill in NAD Neuro:AAOx4, speech clear, MAE CV: s1s2 rrr, no m/r/g PULM: resp's even/non-labored off bipap, lungs bilaterally clear GI: obese / soft, bsx4 active Extremities: warm/dry, 1-2+ pitting edema in bilateral lower extremities   Ventilator settings: Vent Mode:  [-]  FiO2 (%):  [35 %] 35 %  Labs    CBC  Lab 03/29/12 1140 03/28/12 0447 03/27/12 1911  HGB 13.4 14.0 13.8  HCT 41.9 42.3 41.4  WBC 6.4 6.4 5.1  PLT 105* 101* 103*   BMET  Lab 03/29/12 1140 03/28/12 0447 03/27/12 1911  NA 134* 135 135  K 5.0 3.8 --  CL 95* 97 97  CO2 30 31 30   GLUCOSE 104* 101* 96  BUN 19 12 11   CREATININE 2.20* 0.82 0.81  CALCIUM 9.7 9.8 9.7  MG 1.9 -- --  PHOS 5.5* -- --     Lab 03/29/12 1140  INR 0.95     Lab 03/29/12 1330 03/29/12 1100  PHART 7.317* 7.218*  PCO2ART 57.9* 77.1*  PO2ART 96.9 72.0*  HCO3 28.8* 30.2*  TCO2 30.5 32.6  O2SAT 98.2 92.6     Radiology: CXR>>>10/10 Hypoventilation with bibasilar atelectasis    Assessment and Plan: Active Problems:  OBESITY  OBSTRUCTIVE SLEEP APNEA  CHEST PAIN, HX OF  Schizoaffective disorder, bipolar type  Hypertension  Atrial fibrillation  Hypothyroidism  Diabetes mellitus  Hypotension  Acute respiratory failure with hypoxia   Acute Respiratory Failure    OSA Acute Hypoxia  Assessment:  Acute hypoxia & resp failure in setting of OSA, narcotic use, and aspiration of oral chewing Tobacco.    Patient given narcan with resolution of altered mental status.  Pt was able to cough up large chunk of chewing tobacco.    Plan: -observe overnight in SDU -PRN bipap -PRN narcan -hold further narcotics -no further tobacco use / cessation counseling    PCCM will sign off. Please call back if further assistance needed.  Thank you for the consult.    Canary Brim, NP-C [facility] [facility] Pgr: [phone]  03/29/2012, 2:20 PM    Billy Fischer, MD ; [facility] [phone].  After 5:30 PM or weekends, call [phone]

## 2012-03-29 NOTE — Progress Notes (Signed)
Patient c/o SOB.  Oxygen saturation 97% on room air and blood pressure is 83/42.  Dr. Malachi Bonds notified.  Will continue to monitor. Nolon Nations

## 2012-03-29 NOTE — Progress Notes (Signed)
Peak flow was ordered by MD but cannot be done. Patient is unable to perform peak flow and is currently being placed on BIPAP per her ABG results and MD order.

## 2012-03-29 NOTE — Progress Notes (Signed)
ANTIBIOTIC CONSULT NOTE - INITIAL  Pharmacy Consult for Levaquin Indication: empiric coverage  Allergies  Allergen Reactions  . Sulfamethoxazole W-Trimethoprim Hives and Nausea And Vomiting  . Penicillins Hives    Patient Measurements: Height: 5\' 1"  (154.9 cm) Weight: 338 lb 6.4 oz (153.497 kg) IBW/kg (Calculated) : 47.8    Vital Signs: Temp: 97.5 F (36.4 C) (10/10 0511) Temp src: Axillary (10/10 0511) BP: 100/65 mmHg (10/10 1106) Pulse Rate: 84  (10/10 1024) Intake/Output from previous day: 10/09 0701 - 10/10 0700 In: 1320 [P.O.:1320] Out: 202 [Urine:201; Stool:1] Intake/Output from this shift:    Labs:  [facility] 03/28/12 0447 03/27/12 1911  WBC 6.4 5.1  HGB 14.0 13.8  PLT 101* 103*  LABCREA -- --  CREATININE 0.82 0.81   Estimated Creatinine Clearance: 106.4 ml/min (by C-G formula based on Cr of 0.82). No results found for this basename: VANCOTROUGH:2,VANCOPEAK:2,VANCORANDOM:2,GENTTROUGH:2,GENTPEAK:2,GENTRANDOM:2,TOBRATROUGH:2,TOBRAPEAK:2,TOBRARND:2,AMIKACINPEAK:2,AMIKACINTROU:2,AMIKACIN:2, in the last 72 hours   Microbiology: Recent Results (from the past 720 hour(s))  WET PREP, GENITAL     Status: Normal   Collection Time   03/20/12  4:01 PM      Component Value Range Status Comment   Yeast Wet Prep HPF POC NONE SEEN  NONE SEEN Final    Trich, Wet Prep NONE SEEN  NONE SEEN Final    Clue Cells Wet Prep HPF POC NONE SEEN  NONE SEEN Final    WBC, Wet Prep HPF POC NONE SEEN  NONE SEEN Final     Medical History: Past Medical History  Diagnosis Date  . Schizoaffective disorder   . Urinary tract infection   . Hypertension   . Atrial fibrillation   . GERD (gastroesophageal reflux disease)   . Hypothyroidism   . Myocardial infarction   . Diabetes mellitus   . Obesity   . Asthma   . COPD (chronic obstructive pulmonary disease)   . Pneumonia   . CHF (congestive heart failure)   . Seizures   . Morbid obesity   . Chronic pain      Assessment: Patient  is a 61 y.o F with c/o SOB.  To start Levaquin for empiric coverage.  Plan:  1) Levaquin 750mg  IV q24h  Akela Pocius P 03/29/2012,11:13 AM

## 2012-03-29 NOTE — Progress Notes (Addendum)
[facility] PROGRESS NOTE  MARGE VANDERMEULEN NGE:[id number] DOB: Nov 06, 1950 DOA: 03/27/2012 PCP: No primary provider on file.  Assessment/Plan:  Shortness of breath with cough and desaturations, worsening with prolonged expiratory phase, dyspnea, increase wheeze and SCM retractions.  No evidence of pneumonia on CT, however, did have atelectasis and patient has OSA so she was started on CPAP on 10/9 overnight.  Known depressed EF of 47% on last NM study in 12/2011.  BNP was low, however, patient is obese which may affect BNP results.   -  Peak flow -  F/u ECHO -  ABG:  7.21/77/92, likely primary respiratory acidosis superimposed on chronic retention.  -  Gave one dose of narcan and patient woke up some and then coughed up a golf-ball sized ball of chew tobacco.   -  Repeat ABG after 1 hour of bipap ordered.  -  CXR:  Diminished volumes, enlarged cardiac silhouette, possible pulmonary edema, but blood pressures still soft.   -  Duoneb with solumedrol and levaquin for possible asthma exacerbation -  Consider dose of lasix if blood pressure normalizes -  Consider transfer to higher level of care pending results of initial work up and therapies  Hypotension, recurrent.  May be side effect of medication.  Patient received blood pressure medications yesterday morning and blood pressures were in the low normal range.  This morning, she had a blood pressure in the low 80s systolic.  Asked if she had been taking her blood pressure medications at home and she assured me that she had.  -  ECG and troponin -  BMP and CBC and coags -  Consider bolus of IVF if needed, but given shortness of breath and fact that blood pressure increased to normal range without intervention, will monitor until more information about respiratory status (pulmonary edema?)  -  Hold BP medications.  Chest pain.  CTa not suggestive of PE, however, did show cardiomegaly and pulmonary atelectasis.  Troponins <0.3 x 3.  Spoke with  Dr. Sharyn Lull regarding further management who recommended outpatient follow up as she has recently had a catheterization and stress test.  Patient had repeat episode of chest pain overnight and again this morning which she states is severe. -  Continue asa 81mg  -  LDL at goal   -  Hemoglobin a1c from less than 3 months ago was 5.4 -  Repeat troponin and ECG -  Continue protonix and maalox.  Abdominal pain, recurrent.  LFTs within normal limits.  Patient has had multiple lipase reported, all wnl. Likely due to constipation given history.   -  Repeat lipase and LFTs and UA -  Lactic acid -  Enema and miralax and bisacodyl  Pain control, patient does not appear uncomfortable despite 10/10 pain.   -  Tylenol with Oxycodone  -  Bowel regimen as above  Schizoaffective disorder/bipolar type, currently not on any medications although patient states she was previously on seroquel, trazodone, and risperdol.  Defer to outpatient management.   -  Trazodone 50mg  qhs prn sleep  Thrombocytopenia, recurrent.  No signs of bleeding or need for transfusion. - follow up with PCP  Macrocytosis without anemia.  Consider medication induced or vitamin deficiency. Follow up with PCP.    DIET: Healthy heart ACCESS:  PIV IVF:  NOne PROPH:  lovenox  Code Status: Full Family Communication: Patient at bedside Disposition Plan: Pending improvement in respiratory status and stable blood pressure.    HPI:  This is a 61 year old female who  presents with complaints of chest pain since yesterday. It is centrally located and it is worse with deep breathing. She reports palpitations diaphoresis shortness of breath associated with her chest pains. She also said reports a cough nonproductive. Pain is intermittent and ranked a 5/10 at its worse. It radiates towards her left arm. The patient is well-known to her cardiologist Dr. Sharyn Lull, he states to see her in a.m. Currently patient states his chest pain  free   Consultants:  Spoke with Dr. Sharyn Lull who reviewed records and recommended outpatient follow up  Procedures:  CTa chest  Antibiotics:  None  HPI/Subjective:  States she has worsening shortness of breath and chest pain and abdominal pain this morning.  States her chest pain started overnight and has been coming and going ever since.  She had a BM yesterday which helped her abdominal pain somewhat, but now it is worsening.    Objective: Filed Vitals:   03/28/12 2230 03/29/12 0511 03/29/12 1024 03/29/12 1043  BP:  81/42 83/42 125/72  Pulse: 82 52 84   Temp:  97.5 F (36.4 C)    TempSrc:  Axillary    Resp:  16    Height:      Weight:      SpO2: 100% 98%      Intake/Output Summary (Last 24 hours) at 03/29/12 1054 Last data filed at 03/28/12 1830  Gross per 24 hour  Intake    720 ml  Output    202 ml  Net    518 ml   Filed Weights   03/28/12 0620  Weight: 153.497 kg (338 lb 6.4 oz)    Exam:  General: Alert, obese caucasian female in moderate respiratory distress with audible wheeze Eyes: PERRLA, pink conjunctiva, no scleral icterus  ENT: Moist oral mucosa Lungs:  SCM retractions, prolonged expiratory phase with audible wheeze. No rales or rhonchi.  Upper airway congestion.  Cardiovascular: regular rate and rhythm, no regurgitation, no gallops, no murmurs. Abdomen: soft, positive BS, tender diffusely without rebound or guarding.  Neuro: CN II - XII grossly intact, sensation intact  Musculoskeletal: strength 5/5 all extremities.  2+ pitting edema of bilateral feet.  ECG:  Stable from prior.  No signs of acute ischemia.    Data Reviewed: Basic Metabolic Panel:  Lab 03/28/12 8295 03/27/12 1911  NA 135 135  K 3.8 4.1  CL 97 97  CO2 31 30  GLUCOSE 101* 96  BUN 12 11  CREATININE 0.82 0.81  CALCIUM 9.8 9.7  MG -- --  PHOS -- --   Liver Function Tests:  Lab 03/27/12 1911  AST 32  ALT 22  ALKPHOS 52  BILITOT 0.3  PROT 6.6  ALBUMIN 3.1*    Lab  03/28/12 1457  LIPASE 40  AMYLASE --   No results found for this basename: AMMONIA:5 in the last 168 hours CBC:  Lab 03/28/12 0447 03/27/12 1911  WBC 6.4 5.1  NEUTROABS -- 2.4  HGB 14.0 13.8  HCT 42.3 41.4  MCV 101.7* 101.2*  PLT 101* 103*   Cardiac Enzymes:  Lab 03/28/12 1457 03/28/12 1102 03/28/12 0447  CKTOTAL -- -- --  CKMB -- -- --  CKMBINDEX -- -- --  TROPONINI <0.30 <0.30 <0.30   BNP (last 3 results)  Basename 03/27/12 2102 03/04/12 0519 02/08/12 2324  PROBNP 57.7 103.2 44.3   CBG:  Lab 03/29/12 0723 03/28/12 2033 03/28/12 1658 03/28/12 1125 03/28/12 0748  GLUCAP 88 211* 100* 113* 95    Recent Results (  from the past 240 hour(s))  WET PREP, GENITAL     Status: Normal   Collection Time   03/20/12  4:01 PM      Component Value Range Status Comment   Yeast Wet Prep HPF POC NONE SEEN  NONE SEEN Final    Trich, Wet Prep NONE SEEN  NONE SEEN Final    Clue Cells Wet Prep HPF POC NONE SEEN  NONE SEEN Final    WBC, Wet Prep HPF POC NONE SEEN  NONE SEEN Final      Studies: Dg Chest 2 View  03/27/2012  *RADIOLOGY REPORT*  Clinical Data: Abdominal pain, shortness of breath  CHEST - 2 VIEW  Comparison: 03/21/2012; 02/14/2012  Findings:  Grossly unchanged enlarged cardiac silhouette and mediastinal contours.  Lung volumes remain reduced with grossly unchanged bibasilar heterogeneous opacities.  No definite new focal airspace opacities.  No definite pleural effusion or pneumothorax. Unchanged bones.  IMPRESSION: No acute cardiopulmonary disease.   Original Report Authenticated By: Waynard Reeds, M.D.    Ct Angio Chest Pe W/cm &/or Wo Cm  03/28/2012  *RADIOLOGY REPORT*  Clinical Data: 61 year old female with pain radiating to the epigastric area.  CT ANGIOGRAPHY CHEST  Technique:  Multidetector CT imaging of the chest using the standard protocol during bolus administration of intravenous contrast. Multiplanar reconstructed images including MIPs were obtained and reviewed to  evaluate the vascular anatomy.  Contrast: OMNIPAQUE IOHEXOL 350 MG/ML SOLN  Comparison: Chest CTA 01/02/2012.  Findings: Suboptimal contrast bolus timing in the pulmonary arterial tree.  Superimposed respiratory motion artifact. Subsequently, no right pulmonary artery or right hilar, lower lobe pulmonary artery filling defect.  The left pulmonary artery branches are less well evaluated.  The left lower lobe branches appear clear.  Atelectatic changes to the major airway is similar to the prior study.  Cardiomegaly.  No pericardial or pleural effusion.  Mild dependent atelectasis.  No consolidation.  No other acute pulmonary opacity.  Negative visualized upper abdominal viscera.  Negative thoracic inlet.  No mediastinal lymphadenopathy.  Degenerative changes in the spine. No acute osseous abnormality identified.  IMPRESSION: 1.  Suboptimal contrast bolus timing, particularly in the left pulmonary arterial tree.  Doubt left sided pulmonary embolus and no evidence of acute PE on the right.  If clinical suspicion persists, Nuclear Medicine VQ scan recommended. 2.  Cardiomegaly.  Pulmonary atelectasis.   Original Report Authenticated By: Ulla Potash III, M.D.     Scheduled Meds:    . ipratropium  0.5 mg Nebulization Q4H   And  . albuterol  2.5 mg Nebulization Q4H  . aspirin EC  81 mg Oral Daily  . bisacodyl  10 mg Oral Once  . enoxaparin (LOVENOX) injection  40 mg Subcutaneous Q24H  . furosemide  20 mg Oral Daily  . influenza  inactive virus vaccine  0.5 mL Intramuscular Tomorrow-1000  . insulin aspart  0-9 Units Subcutaneous TID WC  . levothyroxine  150 mcg Oral Daily  . methylPREDNISolone (SOLU-MEDROL) injection  60 mg Intravenous Q12H  . pantoprazole  40 mg Oral Q1200  . pneumococcal 23 valent vaccine  0.5 mL Intramuscular Tomorrow-1000  . polyethylene glycol  68 g Oral Once  . sodium chloride  3 mL Intravenous Q12H  . sodium phosphate  1 enema Rectal Once  . DISCONTD: lisinopril  40 mg Oral  Q12H  . DISCONTD: metoprolol succinate  50 mg Oral Daily  . DISCONTD: sodium chloride  1,000 mL Intravenous Once   Continuous Infusions:  Active Problems:  OBESITY  OBSTRUCTIVE SLEEP APNEA  CHEST PAIN, HX OF  Schizoaffective disorder, bipolar type  Hypertension  Atrial fibrillation  Hypothyroidism  Diabetes mellitus  Hypotension  Acute respiratory failure with hypoxia    Time spent: 45    Corry Storie, [facility]  [facility] Pager [phone]. If 8PM-8AM, please contact night-coverage at [email or web address], password [facility] 03/29/2012, 10:54 AM  LOS: 2 days

## 2012-03-30 ENCOUNTER — Inpatient Hospital Stay (HOSPITAL_COMMUNITY): Payer: Medicare Other

## 2012-03-30 DIAGNOSIS — R000 Tachycardia, unspecified: Secondary | ICD-10-CM

## 2012-03-30 DIAGNOSIS — R82998 Other abnormal findings in urine: Secondary | ICD-10-CM

## 2012-03-30 LAB — BASIC METABOLIC PANEL
CO2: 29 mEq/L (ref 19–32)
Calcium: 9.7 mg/dL (ref 8.4–10.5)
Creatinine, Ser: 1.07 mg/dL (ref 0.50–1.10)
GFR calc Af Amer: 65 mL/min — ABNORMAL LOW (ref >90)
GFR calc non Af Amer: 56 mL/min — ABNORMAL LOW (ref >90)
Sodium: 134 mEq/L — ABNORMAL LOW (ref 135–145)

## 2012-03-30 LAB — CBC
MCH: 33.1 pg (ref 26.0–34.0)
MCV: 102.4 fL — ABNORMAL HIGH (ref 78.0–100.0)
Platelets: 125 10*3/uL — ABNORMAL LOW (ref 150–400)
RBC: 3.69 MIL/uL — ABNORMAL LOW (ref 3.87–5.11)
RDW: 13.8 % (ref 11.5–15.5)

## 2012-03-30 LAB — GLUCOSE, CAPILLARY
Glucose-Capillary: 114 mg/dL — ABNORMAL HIGH (ref 70–99)
Glucose-Capillary: 135 mg/dL — ABNORMAL HIGH (ref 70–99)
Glucose-Capillary: 170 mg/dL — ABNORMAL HIGH (ref 70–99)

## 2012-03-30 LAB — URINE CULTURE
Colony Count: NO GROWTH
Culture: NO GROWTH

## 2012-03-30 LAB — POCT I-STAT 3, ART BLOOD GAS (G3+)
Acid-Base Excess: 7 mmol/L — ABNORMAL HIGH (ref 0.0–2.0)
Bicarbonate: 32.9 mEq/L — ABNORMAL HIGH (ref 20.0–24.0)
O2 Saturation: 99 %
Patient temperature: 98.9
TCO2: 34 mmol/L (ref 0–100)
pH, Arterial: 7.406 (ref 7.350–7.450)

## 2012-03-30 LAB — SODIUM, URINE, RANDOM: Sodium, Ur: 30 mEq/L

## 2012-03-30 LAB — CREATININE, URINE, RANDOM: Creatinine, Urine: 31.95 mg/dL

## 2012-03-30 MED ORDER — BISACODYL 5 MG PO TBEC
10.0000 mg | DELAYED_RELEASE_TABLET | Freq: Every day | ORAL | Status: DC
  Administered 2012-03-30 – 2012-03-31 (×2): 10 mg via ORAL
  Filled 2012-03-30 (×2): qty 2

## 2012-03-30 MED ORDER — LEVOFLOXACIN 750 MG PO TABS
750.0000 mg | ORAL_TABLET | Freq: Every day | ORAL | Status: DC
  Administered 2012-03-30 – 2012-03-31 (×2): 750 mg via ORAL
  Filled 2012-03-30 (×4): qty 1

## 2012-03-30 MED ORDER — ALBUTEROL SULFATE (5 MG/ML) 0.5% IN NEBU
2.5000 mg | INHALATION_SOLUTION | Freq: Four times a day (QID) | RESPIRATORY_TRACT | Status: DC
  Administered 2012-03-30 – 2012-03-31 (×3): 2.5 mg via RESPIRATORY_TRACT
  Filled 2012-03-30 (×3): qty 0.5

## 2012-03-30 MED ORDER — IPRATROPIUM BROMIDE 0.02 % IN SOLN
0.5000 mg | Freq: Four times a day (QID) | RESPIRATORY_TRACT | Status: DC
  Administered 2012-03-30 – 2012-03-31 (×3): 0.5 mg via RESPIRATORY_TRACT
  Filled 2012-03-30 (×3): qty 2.5

## 2012-03-30 MED ORDER — INSULIN ASPART 100 UNIT/ML ~~LOC~~ SOLN
0.0000 [IU] | Freq: Three times a day (TID) | SUBCUTANEOUS | Status: DC
  Administered 2012-03-30 (×2): 2 [IU] via SUBCUTANEOUS

## 2012-03-30 MED ORDER — ALBUTEROL SULFATE (5 MG/ML) 0.5% IN NEBU: 2.5000 mg | INHALATION_SOLUTION | RESPIRATORY_TRACT | Status: DC | PRN

## 2012-03-30 MED ORDER — FLEET ENEMA 7-19 GM/118ML RE ENEM
1.0000 | ENEMA | Freq: Every day | RECTAL | Status: DC | PRN
  Filled 2012-03-30: qty 1

## 2012-03-30 MED ORDER — NAPHAZOLINE HCL 0.1 % OP SOLN
1.0000 [drp] | Freq: Four times a day (QID) | OPHTHALMIC | Status: DC | PRN
  Filled 2012-03-30: qty 15

## 2012-03-30 MED ORDER — POLYETHYLENE GLYCOL 3350 17 G PO PACK
34.0000 g | PACK | Freq: Every day | ORAL | Status: DC
  Administered 2012-03-31: 34 g via ORAL
  Filled 2012-03-30 (×2): qty 2

## 2012-03-30 MED ORDER — INSULIN ASPART 100 UNIT/ML ~~LOC~~ SOLN: 0.0000 [IU] | Freq: Every day | SUBCUTANEOUS | Status: DC

## 2012-03-30 MED ORDER — INSULIN ASPART 100 UNIT/ML ~~LOC~~ SOLN
3.0000 [IU] | Freq: Three times a day (TID) | SUBCUTANEOUS | Status: DC
  Administered 2012-03-30 – 2012-03-31 (×4): 3 [IU] via SUBCUTANEOUS

## 2012-03-30 MED ORDER — BUDESONIDE 0.25 MG/2ML IN SUSP
0.2500 mg | Freq: Four times a day (QID) | RESPIRATORY_TRACT | Status: DC
  Administered 2012-03-30 – 2012-03-31 (×3): 0.25 mg via RESPIRATORY_TRACT
  Filled 2012-03-30 (×8): qty 2

## 2012-03-30 NOTE — Progress Notes (Addendum)
[facility] PROGRESS NOTE  [name] ZHY:[id number] DOB: September 24, 1950 DOA: 03/27/2012 PCP: No primary provider on file.  Assessment/Plan:  Shortness of breath with cough and desaturations, likely acute COPD exacerbation with superimposed acute diastolic CHF exacerbation on 10/10.  She initially improved after narcan and spitting out her chew tobacco, but then had recurrent respiratory distress despite being wide awake.  Was transferred to stepdown for bipap and treatment for COPD exacerbation was initiated.  BNP was low and ECHO revealed EF of 50-55% with normal wall motion and grade 1 diastolic dysfunction.  Peak flow increased from 200 to 290 after duoneb per respiratory.  After bipap for several hours yesterday, she was weaned to nasal canula and repeat ABG on 4L nasal canula markedly improved pCO2 with normal pH.  Had intermittent desaturation overnight -  Continue duoneb with solumedrol and levaquin for possible asthma exacerbation -  Consider dose of lasix if blood pressure normalizes - Transfer to telemetry -  Counseled about the importance of bipap overnight.  -  Appreciate critical care assistance   Hypotension, improving after holding BP medications on 10/10.   ECG stable and troponin negative.   -  Consider bolus of IVF if needed, but given shortness of breath and fact that blood pressure increased to normal range without intervention, will monitor until more information about respiratory status (pulmonary edema?)  -  Continue to hold BP medications.  Tachycardia, mild.  Likely related to albuterol use.   -  Continue telemetry and treatment for COPD exacerbation  Chest pain, resolving.  CTa not suggestive of PE, however, did show cardiomegaly and pulmonary atelectasis.  Troponins <0.3 x 3.  Spoke with Dr. Sharyn Lull regarding further management who recommended outpatient follow up as she has recently had a catheterization and stress test.  -  Continue asa 81mg  -  LDL at goal    -  Hemoglobin a1c from less than 3 months ago was 5.4 -  Continue protonix and maalox.  AKI with mild hyperkalemia, resolving.  Unclear etiology -  Awaiting FENa and renal ultrasound.  Abdominal pain, resolved.  LFTs within normal limits.  Patient has had multiple lipase reported, all wnl. Likely due to constipation given history.  LFTs, lactic acid, lipase, and UA within normal limits. -  Enema and miralax and bisacodyl  Pain control, improved.  Minimize sedating medications.  - Tramadol as needed  Schizoaffective disorder/bipolar type, currently not on any medications although patient states she was previously on seroquel, trazodone, and risperdol.  Defer to outpatient management.   -  Trazodone 50mg  qhs prn sleep  Thrombocytopenia, recurrent.  No signs of bleeding or need for transfusion.   - follow up with PCP  Macrocytosis without anemia.  Consider medication induced or vitamin deficiency. Follow up with PCP.    Itchy eyes, likely allergic.   -  Eye gtt   DIET: Healthy heart ACCESS:  PIV IVF:  None PROPH:  lovenox  Code Status: Full Family Communication: Patient at bedside Disposition Plan: Pending improvement in respiratory status and stable blood pressure.    HPI:  This is a 61 year old female who presents with complaints of chest pain since yesterday. It is centrally located and it is worse with deep breathing. She reports palpitations diaphoresis shortness of breath associated with her chest pains. She also said reports a cough nonproductive. Pain is intermittent and ranked a 5/10 at its worse. It radiates towards her left arm. The patient is well-known to her cardiologist Dr. Sharyn Lull, he states  to see her in a.m. Currently patient states his chest pain free   Consultants:  Spoke with Dr. Sharyn Lull who reviewed records and recommended outpatient follow up  Procedures:  CTa chest  Antibiotics:  Levofloxacin 10/10 >>  Solumedrol 10/10  >>  HPI/Subjective:  States she is feeling much better, her shortness of breath is markedly improved.  Her chest pain and abdominal pain are improved, but she states that she has not had a BM in two days.  Denies nausea, vomiting.    Objective: Filed Vitals:   03/30/12 0200 03/30/12 0341 03/30/12 0400 03/30/12 0600  BP: 96/49  115/55 109/50  Pulse: 96  94 95  Temp:  98.6 F (37 C)    TempSrc:  Oral    Resp: 18  14 11   Height:      Weight:      SpO2: 97%  94% 99%    Intake/Output Summary (Last 24 hours) at 03/30/12 0809 Last data filed at 03/30/12 1610  Gross per 24 hour  Intake 838.33 ml  Output   2450 ml  Net -1611.67 ml   Filed Weights   03/28/12 0620 03/29/12 1510  Weight: 153.497 kg (338 lb 6.4 oz) 155 kg (341 lb 11.4 oz)    Exam:  General: Alert, obese caucasian female with nasal canula in place, no acute distress Eyes: PERRLA, pink conjunctiva, no scleral icterus  ENT: Moist oral mucosa Lungs:  Diminished bilateral breath sounds without rales or rhonchi Cardiovascular: regular rate and rhythm, no regurgitation, no gallops, no murmurs. Abdomen: soft, positive BS, tender diffusely without rebound or guarding.  Neuro: CN II - XII grossly intact, sensation intact  Musculoskeletal: strength 5/5 all extremities.  2+ pitting edema of bilateral feet.  Data Reviewed: Basic Metabolic Panel:  Lab 03/30/12 9604 03/29/12 1827 03/29/12 1140 03/28/12 0447 03/27/12 1911  NA 134* 134* 134* 135 135  K 5.2* 5.5* 5.0 3.8 4.1  CL 98 97 95* 97 97  CO2 29 29 30 31 30   GLUCOSE 143* 135* 104* 101* 96  BUN 15 18 19 12 11   CREATININE 1.07 1.59* 2.20* 0.82 0.81  CALCIUM 9.7 10.0 9.7 9.8 9.7  MG -- -- 1.9 -- --  PHOS -- -- 5.5* -- --   Liver Function Tests:  Lab 03/29/12 1140 03/27/12 1911  AST 54* 32  ALT 32 22  ALKPHOS 59 52  BILITOT 0.4 0.3  PROT 6.7 6.6  ALBUMIN 3.4* 3.1*    Lab 03/29/12 1140 03/28/12 1457  LIPASE 44 40  AMYLASE -- --   No results found for this  basename: AMMONIA:5 in the last 168 hours CBC:  Lab 03/30/12 0440 03/29/12 1140 03/28/12 0447 03/27/12 1911  WBC 6.8 6.4 6.4 5.1  NEUTROABS -- 3.9 -- 2.4  HGB 12.2 13.4 14.0 13.8  HCT 37.8 41.9 42.3 41.4  MCV 102.4* 105.3* 101.7* 101.2*  PLT 125* 105* 101* 103*   Cardiac Enzymes:  Lab 03/29/12 1140 03/28/12 1457 03/28/12 1102 03/28/12 0447  CKTOTAL -- -- -- --  CKMB -- -- -- --  CKMBINDEX -- -- -- --  TROPONINI <0.30 <0.30 <0.30 <0.30   BNP (last 3 results)  Basename 03/27/12 2102 03/04/12 0519 02/08/12 2324  PROBNP 57.7 103.2 44.3   CBG:  Lab 03/30/12 0339 03/30/12 0005 03/29/12 2151 03/29/12 2038 03/29/12 1702  GLUCAP 136* 170* 135* 143* 145*    Recent Results (from the past 240 hour(s))  WET PREP, GENITAL     Status: Normal   Collection  Time   03/20/12  4:01 PM      Component Value Range Status Comment   Yeast Wet Prep HPF POC NONE SEEN  NONE SEEN Final    Trich, Wet Prep NONE SEEN  NONE SEEN Final    Clue Cells Wet Prep HPF POC NONE SEEN  NONE SEEN Final    WBC, Wet Prep HPF POC NONE SEEN  NONE SEEN Final   MRSA PCR SCREENING     Status: Normal   Collection Time   03/29/12  3:18 PM      Component Value Range Status Comment   MRSA by PCR NEGATIVE  NEGATIVE Final      Studies: Dg Chest Port 1 View  03/30/2012  *RADIOLOGY REPORT*  Clinical Data: Shortness of breath.  Aspiration.  Wheezing.  PORTABLE CHEST - 1 VIEW  Comparison: 03/29/2012.  Findings: Cardiomegaly.  Pulmonary vascular prominence most notable centrally.  Increased markings left base may represent crowding of vessels or subsegmental atelectasis.  Evaluation limited but without change. No segmental infiltrate separate from this region.  No gross pneumothorax.  Slightly tortuous aorta.  IMPRESSION: No significant change.   Original Report Authenticated By: Fuller Canada, M.D.    Dg Chest Port 1 View  03/29/2012  *RADIOLOGY REPORT*  Clinical Data: Maudean Hoffmann of breath.  Cough and wheezing  PORTABLE CHEST -  1 VIEW  Comparison: 03/27/2012  Findings: Decreased lung volume with bibasilar atelectasis compared with the prior study.  Negative for heart failure or edema.  No pleural effusion.  IMPRESSION: Hypoventilation with bibasilar atelectasis.   Original Report Authenticated By: Camelia Phenes, M.D.     Scheduled Meds:    . ipratropium  0.5 mg Nebulization Q4H   And  . albuterol  2.5 mg Nebulization Q4H  . antiseptic oral rinse  15 mL Mouth Rinse q12n4p  . aspirin EC  81 mg Oral Daily  . chlorhexidine  15 mL Mouth Rinse BID  . enoxaparin (LOVENOX) injection  40 mg Subcutaneous Q24H  . influenza  inactive virus vaccine  0.5 mL Intramuscular Tomorrow-1000  . insulin aspart  0-9 Units Subcutaneous Q4H  . levofloxacin (LEVAQUIN) IV  750 mg Intravenous Q24H  . levothyroxine  150 mcg Oral Daily  . methylPREDNISolone (SOLU-MEDROL) injection  60 mg Intravenous Q12H  . pantoprazole  40 mg Oral Q1200  . pneumococcal 23 valent vaccine  0.5 mL Intramuscular Tomorrow-1000  . sodium chloride  3 mL Intravenous Q12H  . DISCONTD: furosemide  20 mg Oral Daily  . DISCONTD: insulin aspart  0-9 Units Subcutaneous TID WC  . DISCONTD: lisinopril  40 mg Oral Q12H  . DISCONTD: metoprolol succinate  50 mg Oral Daily  . DISCONTD: sodium chloride  1,000 mL Intravenous Once   Continuous Infusions:    . sodium chloride Stopped (03/29/12 1900)    Active Problems:  OBESITY  OBSTRUCTIVE SLEEP APNEA  CHEST PAIN, HX OF  Schizoaffective disorder, bipolar type  Hypertension  Atrial fibrillation  Hypothyroidism  Diabetes mellitus  Hypotension  Acute respiratory failure with hypoxia    Time spent: 45    Janey Petron, [facility]  [facility] Pager [phone]. If 8PM-8AM, please contact night-coverage at [email or web address], password [facility] 03/30/2012, 8:09 AM  LOS: 3 days

## 2012-03-30 NOTE — Progress Notes (Signed)
SUBJ: Resp distress resolved. No new complaints   OBJ:  Filed Vitals:   03/30/12 0800  BP: 108/48  Pulse: 103  Temp: 98.7 F (37.1 C)  Resp: 16    NAD HEENT WNL No JVD noted No wheezes RRR s M NABS Symmetric B edema  BMET    Component Value Date/Time   NA 134* 03/30/2012 0440   K 5.2* 03/30/2012 0440   CL 98 03/30/2012 0440   CO2 29 03/30/2012 0440   GLUCOSE 143* 03/30/2012 0440   BUN 15 03/30/2012 0440   CREATININE 1.07 03/30/2012 0440   CALCIUM 9.7 03/30/2012 0440   GFRNONAA 56* 03/30/2012 0440   GFRAA 65* 03/30/2012 0440    CBC    Component Value Date/Time   WBC 6.8 03/30/2012 0440   RBC 3.69* 03/30/2012 0440   HGB 12.2 03/30/2012 0440   HCT 37.8 03/30/2012 0440   PLT 125* 03/30/2012 0440   MCV 102.4* 03/30/2012 0440   MCH 33.1 03/30/2012 0440   MCHC 32.3 03/30/2012 0440   RDW 13.8 03/30/2012 0440   LYMPHSABS 1.4 03/29/2012 1140   MONOABS 1.0 03/29/2012 1140   EOSABS 0.1 03/29/2012 1140   BASOSABS 0.0 03/29/2012 1140   CXR: CM, vasc congestion, L>R basilar atx  IMPRESSION: Chronic resp failure - presumed COPD (smoker), obesity Acute hypercarbic resp failure - resolved  Presumed to be due to opioids and foreign body (chewing tobacco) airway obstruction  No acute bronchospasm noted on exam today - therefore no indication for systemic steroids DMII  PLAN/REC: D//C systemic steroids Add nebulized steroids Bronchodilator schedule adjusted to q 6 hrs and q 3 hrs PRN I have changes SSI to ACHS as diet has been ordered this AM Would be OK to change abx to PO when deemed appropriate by primary team  Complete 7 days Agree with transfer to Tele bed PCCM will sign off. Please call if we can be of further assistance   Billy Fischer, MD ; [facility] [phone].  After 5:30 PM or weekends, call [phone]

## 2012-03-30 NOTE — Progress Notes (Addendum)
Witnessed pt removing BiPAP mask numerous times tonight despite receiving education about its importance. Pt refuses to wear BiPAP at this time stating "I don't need that, I know my body better than anyone else" and "I can't breathe with that thing on my face." Pt complained of facial irritation r/t mask. Assessed pt's face and skin is intact with no redness. Placed pt on N/C at 4lpm. No acute respiratory distress noted, O2 sats maintained at 95-200% on 4lpm.

## 2012-03-30 NOTE — ED Provider Notes (Signed)
Medical screening examination/treatment/procedure(s) were conducted as a shared visit with non-physician practitioner(s) and myself.  I personally evaluated the patient during the encounter  Doug Sou, MD 03/30/12 2346

## 2012-03-31 LAB — CBC
HCT: 37.3 % (ref 36.0–46.0)
MCHC: 32.7 g/dL (ref 30.0–36.0)
MCV: 103 fL — ABNORMAL HIGH (ref 78.0–100.0)
Platelets: 161 10*3/uL (ref 150–400)
RDW: 14.6 % (ref 11.5–15.5)
WBC: 9 10*3/uL (ref 4.0–10.5)

## 2012-03-31 LAB — BASIC METABOLIC PANEL
BUN: 15 mg/dL (ref 6–23)
Calcium: 9.8 mg/dL (ref 8.4–10.5)
Chloride: 97 mEq/L (ref 96–112)
Creatinine, Ser: 0.99 mg/dL (ref 0.50–1.10)
GFR calc Af Amer: 71 mL/min — ABNORMAL LOW (ref >90)
GFR calc non Af Amer: 62 mL/min — ABNORMAL LOW (ref >90)

## 2012-03-31 MED ORDER — IPRATROPIUM BROMIDE 0.02 % IN SOLN: 0.5000 mg | Freq: Two times a day (BID) | RESPIRATORY_TRACT | Status: DC

## 2012-03-31 MED ORDER — ALBUTEROL SULFATE (5 MG/ML) 0.5% IN NEBU: 2.5000 mg | INHALATION_SOLUTION | Freq: Two times a day (BID) | RESPIRATORY_TRACT | Status: DC

## 2012-03-31 MED ORDER — TRAMADOL HCL 50 MG PO TABS: 50.0000 mg | ORAL_TABLET | Freq: Four times a day (QID) | ORAL | Status: DC | PRN

## 2012-03-31 MED ORDER — BUDESONIDE 0.25 MG/2ML IN SUSP: 0.2500 mg | Freq: Two times a day (BID) | RESPIRATORY_TRACT | Status: DC

## 2012-03-31 MED ORDER — BISACODYL 5 MG PO TBEC: 10.0000 mg | DELAYED_RELEASE_TABLET | Freq: Every day | ORAL | Status: DC

## 2012-03-31 MED ORDER — BUDESONIDE 0.25 MG/2ML IN SUSP
0.2500 mg | Freq: Two times a day (BID) | RESPIRATORY_TRACT | Status: DC
  Filled 2012-03-31 (×2): qty 2

## 2012-03-31 MED ORDER — ALBUTEROL SULFATE (5 MG/ML) 0.5% IN NEBU: 2.5000 mg | INHALATION_SOLUTION | RESPIRATORY_TRACT | Status: DC | PRN

## 2012-03-31 MED ORDER — POLYETHYLENE GLYCOL 3350 17 G PO PACK: 34.0000 g | PACK | Freq: Every day | ORAL | Status: DC

## 2012-03-31 MED ORDER — LEVOFLOXACIN 750 MG PO TABS: 750.0000 mg | ORAL_TABLET | Freq: Every day | ORAL | Status: DC

## 2012-03-31 NOTE — Discharge Summary (Signed)
Physician Discharge Summary  [name] AVW:[id number] DOB: 02/06/1951 DOA: 03/27/2012  PCP: No primary provider on file.  Admit date: 03/27/2012 Discharge date: 03/31/2012  Recommendations for Outpatient Follow-up:  1. Home health aid and RN to assist with home bipap, home nebulizer, new medications 2. Follow up with primary care doctor for referral for sleep study, repeat chemistry to assess kidney function, blood pressure check for consideration of restarting blood pressure medications, CBC to check platelet count and MCV.  Consider work up for macrocytosis as outpatient.  If blood pressure tolerates and kidney function continues to improve, please start lasix.  Consider PFTs when improved.   3. Follow up with Dr. Sharyn Lull  Discharge Diagnoses:  Active Problems:  OBESITY  OBSTRUCTIVE SLEEP APNEA  CHEST PAIN, HX OF  Schizoaffective disorder, bipolar type  Hypertension  Atrial fibrillation  Hypothyroidism  Diabetes mellitus  Hypotension  Acute respiratory failure with hypoxia  Tachycardia   Discharge Condition: stable, improved  Diet recommendation: diabetic, healthy heart  Wt Readings from Last 3 Encounters:  03/29/12 155 kg (341 lb 11.4 oz)  01/27/12 163.295 kg (360 lb)  01/19/12 163.295 kg (360 lb)    History of present illness:   This is a 61 year old female who presents with complaints of chest pain since yesterday. It is centrally located and it is worse with deep breathing. She reports palpitations diaphoresis shortness of breath associated with her chest pains. She also said reports a cough nonproductive. Pain is intermittent and ranked a 5/10 at its worse. It radiates towards her left arm. The patient is well-known to her cardiologist Dr. Sharyn Lull, he states to see her in a.m. Currently patient states his chest pain free   Hospital Course:   Chest pain.  CTa not suggestive of PE, however, did show cardiomegaly and pulmonary atelectasis.  Troponins <0.3 x 3 and  her ECG remained stable from prior. Spoke with Dr. Sharyn Lull regarding further management who recommended outpatient follow up as she has recently had a catheterization and stress test.  She continue asa 81mg .  Her LDL was at goal and hemoglobin a1c from less than 3 months ago was 5.4  She was given protonix and maalox, which helped her pain somewhat, but her chest discomfort improved primarily with treatment of her respiratory problems, see below.    Lab Results  Component Value Date   CHOL 164 03/28/2012   HDL 55 03/28/2012   LDLCALC 91 03/28/2012   TRIG 92 03/28/2012   CHOLHDL 3.0 03/28/2012     Shortness of breath with cough and desaturations, likely acute COPD exacerbation with superimposed acute diastolic CHF exacerbation start the morning of 10/10.  She had been receiving low dose narcotics but had AKI which likely caused some retention of narcotic and mild sedation.  She initially improved after narcan, an hour of bipap, and with spitting out a wad of retained chew tobacco, but then had recurrent respiratory distress despite being wide awake. She was transferred to stepdown for bipap on 10/10 and treatment for COPD exacerbation was initiated with solumedrol, duonebs, and levofloxacin. BNP was low and ECHO revealed EF of 50-55% with normal wall motion and grade 1 diastolic dysfunction.  Peak flow increased from 200 to 290 after duoneb per respiratory.  After bipap for several hours, she was weaned to nasal canula and repeat ABG on 4L nasal canula markedly improved pCO2 with normal pH.  She had frequent desaturations to the low 80s while on nasal canula with associated apneas consistent with OSA  and was strongly encouraged to use BIPAP overnight for respiratory support.  She previously had a "breathing machine" and had been trying to get a new one per her report.  She was weaned to nebulized steroids and less frequent duonebs and her breathing continued to improve.  She was weaned to room air prior to  discharge and was able to ambulate maintaining O2 saturations in the mid to high 90s.    Hypotension, likely related to narcotics and improved after holding BP medications on 10/10.  ECG stable and troponin negative.  She did not require a bolus and her blood pressure recovered spontaneously.  We continued to hold her BP medications and her blood pressure remained in the normal range.  She should follow up with her primary care doctor for repeat blood pressure check within one week to restart home medications if needed.    Tachycardia, mild sinus tachycardia to the 90s and low 100s likely related to albuterol use as it started at the time that breathing treatments were initiated.  Expect this will continue to improve as her breathing treatments become less frequent.   AKI with mild hyperkalemia.  AKI developed on 10/10.  Creatinine increased from 0.82 to 2.2 in one day and returned to 0.99 on the day of discharge, two days later.  FENa was less than 1% (0.75%).  Renal US was normal.  Avoided diuretics and creatinine recovered briskly without hydration.  She should follow up with her primary care doctor within 3-5 days to potentially restart her lasix.    Abdominal pain, started at presentation.   LFTs, lactic acid, lipase, and UA were within normal limits.  Likely due to constipation and improved after large BM after enema and miralax and bisacodyl.  Pain control, improved initially with narcotics, but after episode of respiratory distress was changed to tramadol as needed which she tolerated well.  Schizoaffective disorder/bipolar type, currently not on any medications although patient states she was previously on seroquel, trazodone, and risperdol. Defer to outpatient management.   Thrombocytopenia, recurrent based on previous admission testing.   She had no signs of bleeding or need for transfusion.  She nadired on 10/9 with plt of 101 and they recovered to 161 at discharge.  Macrocytosis without  anemia. Consider medication induced or vitamin deficiency. Follow up with PCP.   Itchy eyes, likely allergic.  Start eye gtt.     Procedures:  Bipap  CT angio chest 10/9  Renal US 10/11  ECHO 10/9  Consultations:  Pulmonary critical care  Discharge Exam: Filed Vitals:   03/31/12 0500  BP: 107/53  Pulse: 105  Temp: 98.2 F (36.8 C)  Resp:    Filed Vitals:   03/30/12 1933 03/30/12 2100 03/31/12 0500 03/31/12 0922  BP:  116/74 107/53   Pulse:  118 105   Temp:  98.8 F (37.1 C) 98.2 F (36.8 C)   TempSrc:  Oral Axillary   Resp:      Height:      Weight:      SpO2: 91% 95% 95% 93%    General: Alert, obese caucasian female on room air, no acute distress  Eyes: PERRLA, pink conjunctiva, no scleral icterus  ENT: Moist oral mucosa  Lungs: Improved bilateral breath sounds without rales or rhonchi or significant wheeze Cardiovascular: regular rate and rhythm, no regurgitation, no gallops, no murmurs.  Abdomen: soft, positive BS, nontender  Neuro: CN II - XII grossly intact, sensation intact  Musculoskeletal: strength 5/5 all extremities. 2+ pitting  edema of bilateral feet.   Discharge Instructions      Discharge Orders    Future Orders Please Complete By Expires   Diet - low sodium heart healthy      Increase activity slowly      Discharge instructions      Comments:   You were hospitalized with chest pain and shortness of breath.  You had severe difficulty breathing and required bipap treatment in the stepdown unit.  You had multiple pauses in your breathing causing desaturations to the low 80s while you were asleep on nasal canula.  You should use bipap at night and talk to your primary care doctor about a sleep study.  Please continue breathing treatments with duonebs every four to six hours and continue pulmicort daily.  Please complete 3 more days of antibiotics.  Your chest pain was likely related to your breathing.  You did not have a heart attack and your  heart function looked good on the ultrasound of your heart.  You did not have a blood clot in the lungs causing your pain.  Because your heart rate was a little low, your blood pressure medications were stopped.  Please see your primary care doctor within 3-5 days to have your blood pressure rechecked.  You kidneys temporarily stopped working as well, but they are now healing.  Your doctor should check your kidney function again during your follow up appointment.  Please start using stool softeners every day to prevent constipation.  Talk to your primary care doctor about medications for schizoaffective disorder and bipolar disorder.   Call MD for:      Comments:   Call 911 if you have chest pain, shortness of breath, slurred speech, confusion, facial droop, or numbness or weakness of an arm or leg.   Call MD for:  temperature >100.4      Call MD for:  persistant nausea and vomiting      Call MD for:  severe uncontrolled pain      Call MD for:  difficulty breathing, headache or visual disturbances      Call MD for:  hives      Call MD for:  persistant dizziness or light-headedness      Call MD for:  extreme fatigue          Medication List     As of 03/31/2012  3:16 PM    STOP taking these medications         furosemide 20 MG tablet   Commonly known as: LASIX      lisinopril 40 MG tablet   Commonly known as: PRINIVIL,ZESTRIL      metoprolol succinate 50 MG 24 hr tablet   Commonly known as: TOPROL-XL      TAKE these medications         albuterol 108 (90 BASE) MCG/ACT inhaler   Commonly known as: PROVENTIL HFA;VENTOLIN HFA   Inhale 1 puff into the lungs every 6 (six) hours as needed. For wheezing      albuterol (5 MG/ML) 0.5% nebulizer solution   Commonly known as: PROVENTIL   Take 0.5 mLs (2.5 mg total) by nebulization every 4 (four) hours as needed for wheezing or shortness of breath.      albuterol (5 MG/ML) 0.5% nebulizer solution   Commonly known as: PROVENTIL   Take 0.5  mLs (2.5 mg total) by nebulization 2 (two) times daily.      aspirin EC 81 MG tablet   Take 81  mg by mouth daily.      benzonatate 200 MG capsule   Commonly known as: TESSALON   Take 200 mg by mouth 3 (three) times daily as needed. For cough      bisacodyl 5 MG EC tablet   Commonly known as: DULCOLAX   Take 2 tablets (10 mg total) by mouth daily.      budesonide 0.25 MG/2ML nebulizer solution   Commonly known as: PULMICORT   Take 2 mLs (0.25 mg total) by nebulization 2 (two) times daily.      fluticasone 50 MCG/ACT nasal spray   Commonly known as: FLONASE   Place 2 sprays into the nose daily as needed. For nasal congestion      ipratropium 0.02 % nebulizer solution   Commonly known as: ATROVENT   Take 2.5 mLs (0.5 mg total) by nebulization 2 (two) times daily.      levofloxacin 750 MG tablet   Commonly known as: LEVAQUIN   Take 1 tablet (750 mg total) by mouth daily.      levothyroxine 150 MCG tablet   Commonly known as: SYNTHROID, LEVOTHROID   Take 150 mcg by mouth daily.      omeprazole 20 MG capsule   Commonly known as: PRILOSEC   Take 20 mg by mouth daily.      polyethylene glycol packet   Commonly known as: MIRALAX / GLYCOLAX   Take 34 g by mouth daily.      traMADol 50 MG tablet   Commonly known as: ULTRAM   Take 1 tablet (50 mg total) by mouth every 6 (six) hours as needed for pain. For pain         Follow-up Information    Follow up with [name], MD. Schedule an appointment as soon as possible for a visit in 1 week.   Contact information:   [address] [place] [address] [phone]       Follow up with primary care physician. Schedule an appointment as soon as possible for a visit in 5 days.      Follow up with [facility]. [facility] RN, and aide)    Contact information:   [phone]          The results of significant diagnostics from this hospitalization (including imaging, microbiology,  ancillary and laboratory) are listed below for reference.    Significant Diagnostic Studies: Dg Chest 2 View  03/27/2012  *RADIOLOGY REPORT*  Clinical Data: Abdominal pain, shortness of breath  CHEST - 2 VIEW  Comparison: 03/21/2012; 02/14/2012  Findings:  Grossly unchanged enlarged cardiac silhouette and mediastinal contours.  Lung volumes remain reduced with grossly unchanged bibasilar heterogeneous opacities.  No definite new focal airspace opacities.  No definite pleural effusion or pneumothorax. Unchanged bones.  IMPRESSION: No acute cardiopulmonary disease.   Original Report Authenticated By: Waynard Reeds, M.D.    Dg Chest 2 View  03/21/2012  *RADIOLOGY REPORT*  Clinical Data: Shortness of breath, chest pain.  CHEST - 2 VIEW  Comparison: 02/29/2012  Findings: Heart size mildly enlarged.  Mild central vascular congestion.  No overt edema.  No focal consolidation, pleural effusion, or pneumothorax.  Multilevel degenerative change.  No acute osseous finding.  IMPRESSION: Heart size mildly enlarged.  No overt edema or focal consolidation.   Original Report Authenticated By: Waneta Martins, M.D.    Ct Angio Chest Pe W/cm &/or Wo Cm  03/28/2012  *RADIOLOGY REPORT*  Clinical Data: 61 year old female  with pain radiating to the epigastric area.  CT ANGIOGRAPHY CHEST  Technique:  Multidetector CT imaging of the chest using the standard protocol during bolus administration of intravenous contrast. Multiplanar reconstructed images including MIPs were obtained and reviewed to evaluate the vascular anatomy.  Contrast: OMNIPAQUE IOHEXOL 350 MG/ML SOLN  Comparison: Chest CTA 01/02/2012.  Findings: Suboptimal contrast bolus timing in the pulmonary arterial tree.  Superimposed respiratory motion artifact. Subsequently, no right pulmonary artery or right hilar, lower lobe pulmonary artery filling defect.  The left pulmonary artery branches are less well evaluated.  The left lower lobe branches appear  clear.  Atelectatic changes to the major airway is similar to the prior study.  Cardiomegaly.  No pericardial or pleural effusion.  Mild dependent atelectasis.  No consolidation.  No other acute pulmonary opacity.  Negative visualized upper abdominal viscera.  Negative thoracic inlet.  No mediastinal lymphadenopathy.  Degenerative changes in the spine. No acute osseous abnormality identified.  IMPRESSION: 1.  Suboptimal contrast bolus timing, particularly in the left pulmonary arterial tree.  Doubt left sided pulmonary embolus and no evidence of acute PE on the right.  If clinical suspicion persists, Nuclear Medicine VQ scan recommended. 2.  Cardiomegaly.  Pulmonary atelectasis.   Original Report Authenticated By: Harley Hallmark, M.D.    US Renal  03/30/2012  *RADIOLOGY REPORT*  Clinical Data: History of AKI.  History of diabetes and hypertension.  RENAL/URINARY TRACT ULTRASOUND COMPLETE  Comparison:  Ultrasound 12/26/2009.  CT 06/20/2010.  Findings:  Right Kidney:  Right renal length is 12.0 cm.  Left Kidney:  Left renal length is 11.5 cm.  Examination of each kidney shows no evidence of hydronephrosis, solid or cystic mass, calculus, parenchymal loss, or parenchymal textural abnormality.  Bladder:  Bladder is decompressed by Foley catheter and is not visualized.  IMPRESSION: No renal lesions are identified.   Original Report Authenticated By: Crawford Givens, M.D.    Dg Chest Port 1 View  03/30/2012  *RADIOLOGY REPORT*  Clinical Data: Shortness of breath.  Aspiration.  Wheezing.  PORTABLE CHEST - 1 VIEW  Comparison: 03/29/2012.  Findings: Cardiomegaly.  Pulmonary vascular prominence most notable centrally.  Increased markings left base may represent crowding of vessels or subsegmental atelectasis.  Evaluation limited but without change. No segmental infiltrate separate from this region.  No gross pneumothorax.  Slightly tortuous aorta.  IMPRESSION: No significant change.   Original Report Authenticated By:  Fuller Canada, M.D.    Dg Chest Port 1 View  03/29/2012  *RADIOLOGY REPORT*  Clinical Data: Sachin Ferencz of breath.  Cough and wheezing  PORTABLE CHEST - 1 VIEW  Comparison: 03/27/2012  Findings: Decreased lung volume with bibasilar atelectasis compared with the prior study.  Negative for heart failure or edema.  No pleural effusion.  IMPRESSION: Hypoventilation with bibasilar atelectasis.   Original Report Authenticated By: Camelia Phenes, M.D.     Microbiology: Recent Results (from the past 240 hour(s))  URINE CULTURE     Status: Normal   Collection Time   03/29/12  1:29 PM      Component Value Range Status Comment   Specimen Description URINE, CATHETERIZED   Final    Special Requests NONE   Final    Culture  Setup Time 03/29/2012 14:16   Final    Colony Count NO GROWTH   Final    Culture NO GROWTH   Final    Report Status 03/30/2012 FINAL   Final   MRSA PCR SCREENING  Status: Normal   Collection Time   03/29/12  3:18 PM      Component Value Range Status Comment   MRSA by PCR NEGATIVE  NEGATIVE Final      Labs: Basic Metabolic Panel:  Lab 03/31/12 1191 03/30/12 0440 03/29/12 1827 03/29/12 1140 03/28/12 0447  NA 134* 134* 134* 134* 135  K 4.6 5.2* 5.5* 5.0 3.8  CL 97 98 97 95* 97  CO2 32 29 29 30 31   GLUCOSE 109* 143* 135* 104* 101*  BUN 15 15 18 19 12   CREATININE 0.99 1.07 1.59* 2.20* 0.82  CALCIUM 9.8 9.7 10.0 9.7 9.8  MG -- -- -- 1.9 --  PHOS -- -- -- 5.5* --   Liver Function Tests:  Lab 03/29/12 1140 03/27/12 1911  AST 54* 32  ALT 32 22  ALKPHOS 59 52  BILITOT 0.4 0.3  PROT 6.7 6.6  ALBUMIN 3.4* 3.1*    Lab 03/29/12 1140 03/28/12 1457  LIPASE 44 40  AMYLASE -- --   No results found for this basename: AMMONIA:5 in the last 168 hours CBC:  Lab 03/31/12 0516 03/30/12 0440 03/29/12 1140 03/28/12 0447 03/27/12 1911  WBC 9.0 6.8 6.4 6.4 5.1  NEUTROABS -- -- 3.9 -- 2.4  HGB 12.2 12.2 13.4 14.0 13.8  HCT 37.3 37.8 41.9 42.3 41.4  MCV 103.0* 102.4* 105.3*  101.7* 101.2*  PLT 161 125* 105* 101* 103*   Cardiac Enzymes:  Lab 03/29/12 1140 03/28/12 1457 03/28/12 1102 03/28/12 0447  CKTOTAL -- -- -- --  CKMB -- -- -- --  CKMBINDEX -- -- -- --  TROPONINI <0.30 <0.30 <0.30 <0.30   BNP: BNP (last 3 results)  Basename 03/27/12 2102 03/04/12 0519 02/08/12 2324  PROBNP 57.7 103.2 44.3   CBG:  Lab 03/31/12 1213 03/31/12 0838 03/30/12 2021 03/30/12 1651 03/30/12 1221  GLUCAP 101* 107* 129* 114* 195*    Time coordinating discharge: 45 minutes  Signed:  Ketsia Linebaugh  [facility] 03/31/2012, 3:16 PM

## 2012-03-31 NOTE — Progress Notes (Signed)
CSW consulted by RN re: transport home. Patient transported home via non emergent [facility]. CSW signing off as no other CSW needs are identified.  Lia Foyer, LCSWA [facility] Clinical Social Worker Contact #: [phone] (weekend)

## 2012-03-31 NOTE — Progress Notes (Signed)
   CARE MANAGEMENT NOTE 03/31/2012  Patient:  [name], [name]   Account Number:  [id number]  Date Initiated:  03/28/2012  Documentation initiated by:  GRAVES-BIGELOW,BRENDA  Subjective/Objective Assessment:   Pt admitted with cp.     Action/Plan:   CM will continue to monitor for disposition needs.   Anticipated DC Date:  03/29/2012   Anticipated DC Plan:  HOME/SELF CARE      DC Planning Services  CM consult      [facility] Choice  HOME HEALTH   Choice offered to / List presented to:  C-1 Patient   DME arranged  NEBULIZER MACHINE  BIPAP      DME agency  [facility]     HH arranged  HH-1 RN  HH-4 NURSE'S AIDE      HH agency  [facility]   Status of service:  Completed, signed off Medicare Important Message given?   (If response is "NO", the following Medicare IM given date fields will be blank) Date Medicare IM given:   Date Additional Medicare IM given:    Discharge Disposition:  HOME W HOME HEALTH SERVICES  Per UR Regulation:  Reviewed for med. necessity/level of care/duration of stay  If discussed at Long Length of Stay Meetings, dates discussed:    Comments:  03/31/2012 1200 NCM spoke to pt and states she has [facility] for [facility]. Faxed order for DME and HH to [facility]. Added contact [facility] d/c instructions. Oxygen not needed at this time. Isidoro Donning RN CCM Case Mgmt phone [phone]

## 2012-03-31 NOTE — Progress Notes (Signed)
Chaplain Note:  Chaplain responded to page from pt's nurse.  As requested by pt via nurse, chaplain provided a copy of the Bible for the pt.  Pt expressed appreciation for chaplain support but had no further needs at this time.  Chaplain will follow up as needed.  03/30/12 2148  Clinical Encounter Type  Visited With Patient  Visit Type Spiritual support  Referral From Nurse;Patient  Spiritual Encounters  Spiritual Needs Sacred text  Stress Factors  Patient Stress Factors Health changes;Major life changes  Family Stress Factors None identified (No family present)   [name], Chaplain [phone]

## 2012-03-31 NOTE — Progress Notes (Signed)
Pt o2 saturation  Pre ambulation at 93 - 94 % on 2 LPM o2. On ambulation pt o2 saturation was between 94-99% on room Air. Dr. Malachi Bonds aware of findings. Pt had no c/o of SOB just " tired " per pt. Ancil Linsey Rn

## 2012-04-23 ENCOUNTER — Emergency Department (HOSPITAL_COMMUNITY): Payer: Medicare Other

## 2012-04-23 ENCOUNTER — Encounter (HOSPITAL_COMMUNITY): Payer: Self-pay | Admitting: Emergency Medicine

## 2012-04-23 ENCOUNTER — Emergency Department (HOSPITAL_COMMUNITY)
Admission: EM | Admit: 2012-04-23 | Discharge: 2012-04-24 | Disposition: A | Discharge status: Home / Self Care | Payer: Medicare Other | Attending: Emergency Medicine | Admitting: Emergency Medicine

## 2012-04-23 DIAGNOSIS — G8929 Other chronic pain: Secondary | ICD-10-CM | POA: Insufficient documentation

## 2012-04-23 DIAGNOSIS — Z7982 Long term (current) use of aspirin: Secondary | ICD-10-CM | POA: Insufficient documentation

## 2012-04-23 DIAGNOSIS — I509 Heart failure, unspecified: Secondary | ICD-10-CM | POA: Insufficient documentation

## 2012-04-23 DIAGNOSIS — F259 Schizoaffective disorder, unspecified: Secondary | ICD-10-CM | POA: Insufficient documentation

## 2012-04-23 DIAGNOSIS — J45909 Unspecified asthma, uncomplicated: Secondary | ICD-10-CM | POA: Insufficient documentation

## 2012-04-23 DIAGNOSIS — Z87440 Personal history of urinary (tract) infections: Secondary | ICD-10-CM | POA: Insufficient documentation

## 2012-04-23 DIAGNOSIS — G40909 Epilepsy, unspecified, not intractable, without status epilepticus: Secondary | ICD-10-CM | POA: Insufficient documentation

## 2012-04-23 DIAGNOSIS — I252 Old myocardial infarction: Secondary | ICD-10-CM | POA: Insufficient documentation

## 2012-04-23 DIAGNOSIS — Z79899 Other long term (current) drug therapy: Secondary | ICD-10-CM | POA: Insufficient documentation

## 2012-04-23 DIAGNOSIS — E039 Hypothyroidism, unspecified: Secondary | ICD-10-CM | POA: Insufficient documentation

## 2012-04-23 DIAGNOSIS — E119 Type 2 diabetes mellitus without complications: Secondary | ICD-10-CM | POA: Insufficient documentation

## 2012-04-23 DIAGNOSIS — R000 Tachycardia, unspecified: Secondary | ICD-10-CM | POA: Insufficient documentation

## 2012-04-23 DIAGNOSIS — K219 Gastro-esophageal reflux disease without esophagitis: Secondary | ICD-10-CM | POA: Insufficient documentation

## 2012-04-23 DIAGNOSIS — F17200 Nicotine dependence, unspecified, uncomplicated: Secondary | ICD-10-CM | POA: Insufficient documentation

## 2012-04-23 DIAGNOSIS — J449 Chronic obstructive pulmonary disease, unspecified: Secondary | ICD-10-CM

## 2012-04-23 DIAGNOSIS — Z8701 Personal history of pneumonia (recurrent): Secondary | ICD-10-CM | POA: Insufficient documentation

## 2012-04-23 DIAGNOSIS — I4891 Unspecified atrial fibrillation: Secondary | ICD-10-CM | POA: Insufficient documentation

## 2012-04-23 DIAGNOSIS — J4489 Other specified chronic obstructive pulmonary disease: Secondary | ICD-10-CM | POA: Insufficient documentation

## 2012-04-23 DIAGNOSIS — I10 Essential (primary) hypertension: Secondary | ICD-10-CM | POA: Insufficient documentation

## 2012-04-23 LAB — BASIC METABOLIC PANEL
BUN: 7 mg/dL (ref 6–23)
Calcium: 9.8 mg/dL (ref 8.4–10.5)
Creatinine, Ser: 0.83 mg/dL (ref 0.50–1.10)
GFR calc non Af Amer: 76 mL/min — ABNORMAL LOW (ref >90)
Glucose, Bld: 149 mg/dL — ABNORMAL HIGH (ref 70–99)

## 2012-04-23 LAB — CBC WITH DIFFERENTIAL/PLATELET
Eosinophils Absolute: 0 10*3/uL (ref 0.0–0.7)
Eosinophils Relative: 1 % (ref 0–5)
Hemoglobin: 13.4 g/dL (ref 12.0–15.0)
Lymphs Abs: 2.2 10*3/uL (ref 0.7–4.0)
MCH: 33.6 pg (ref 26.0–34.0)
MCHC: 32.8 g/dL (ref 30.0–36.0)
MCV: 102.3 fL — ABNORMAL HIGH (ref 78.0–100.0)
Monocytes Absolute: 0.6 10*3/uL (ref 0.1–1.0)
Monocytes Relative: 12 % (ref 3–12)
RBC: 3.99 MIL/uL (ref 3.87–5.11)

## 2012-04-23 LAB — POCT I-STAT TROPONIN I: Troponin i, poc: 0.02 ng/mL (ref 0.00–0.08)

## 2012-04-23 MED ORDER — DIVALPROEX SODIUM 500 MG PO DR TAB: 500.0000 mg | DELAYED_RELEASE_TABLET | Freq: Every day | ORAL | Status: DC

## 2012-04-23 MED ORDER — QUETIAPINE FUMARATE 300 MG PO TABS: 300.0000 mg | ORAL_TABLET | Freq: Every day | ORAL | Status: DC

## 2012-04-23 NOTE — ED Notes (Signed)
Pt c/o SOB and midsternal CP since 3 pm today. Pt reports she has been out of town and was able to use her nebulizer so she wants to be admitted so they can teach her how to use it. Pt reports if she gets a bag of fluid and some morphine her CP will be gone. Pt in nad, respirations equal and unlabored, 100% O2 sats on room air.

## 2012-04-23 NOTE — ED Notes (Signed)
Patient has been discharged and is currently waiting on [facility] for transport home

## 2012-04-23 NOTE — ED Notes (Addendum)
Per EMS: pt from home c/o CP and SOB starting 3 hours ago; pt took 324mg  ASA and 1 SL nitro enroute; pt c/o N/V/D; 22g L wrist

## 2012-04-23 NOTE — ED Provider Notes (Signed)
History     CSN: [id number]  Arrival date & time 04/23/12  1735   First MD Initiated Contact with Patient 04/23/12 2221      Chief Complaint  Patient presents with  . Chest Pain  . Shortness of Breath    (Consider location/radiation/quality/duration/timing/severity/associated sxs/prior treatment) HPI Comments: [name] is a 61 y.o. Female who came in for chest pain, and shortness of breath. She has improved, now. She states that she needs refills on Depakote, and Seroquel. Because she is out. She has a nebulizer machine at home, and knows how to use it. She denies nausea, vomiting, fever, chills, cough. There are no other modifying factors.  Patient is a 61 y.o. female presenting with chest pain and shortness of breath. The history is provided by the patient.  Chest Pain Primary symptoms include shortness of breath.    Shortness of Breath  Associated symptoms include chest pain and shortness of breath.    Past Medical History  Diagnosis Date  . Schizoaffective disorder   . Urinary tract infection   . Hypertension   . Atrial fibrillation   . GERD (gastroesophageal reflux disease)   . Hypothyroidism   . Myocardial infarction   . Diabetes mellitus   . Obesity   . Asthma   . COPD (chronic obstructive pulmonary disease)   . Pneumonia   . CHF (congestive heart failure)   . Seizures   . Morbid obesity   . Chronic pain     Past Surgical History  Procedure Date  . Tubal ligation   . Abdominal hysterectomy     Family History  Problem Relation Age of Onset  . Heart disease Father   . Cancer Mother 34    Breast    History  Substance Use Topics  . Smoking status: Current Every Day Smoker -- 1.0 packs/day for 12 years    Last Attempt to Quit: 06/29/2000  . Smokeless tobacco: Current User    Types: Snuff, Chew  . Alcohol Use: 0.0 oz/week    OB History    Grav Para Term Preterm Abortions TAB SAB Ect Mult Living                  Review of Systems    Respiratory: Positive for shortness of breath.   Cardiovascular: Positive for chest pain.  All other systems reviewed and are negative.    Allergies  Sulfamethoxazole w-trimethoprim and Penicillins  Home Medications   Current Outpatient Rx  Name  Route  Sig  Dispense  Refill  . ALBUTEROL SULFATE HFA 108 (90 BASE) MCG/ACT IN AERS   Inhalation   Inhale 1 puff into the lungs every 6 (six) hours as needed. For wheezing         . ASPIRIN EC 81 MG PO TBEC   Oral   Take 81 mg by mouth daily.          Marland Kitchen BISACODYL 5 MG PO TBEC   Oral   Take 2 tablets (10 mg total) by mouth daily.   30 tablet   3   . FLUTICASONE PROPIONATE 50 MCG/ACT NA SUSP   Nasal   Place 2 sprays into the nose daily as needed. For nasal congestion         . LEVOTHYROXINE SODIUM 150 MCG PO TABS   Oral   Take 150 mcg by mouth daily.         . ADULT MULTIVITAMIN W/MINERALS CH   Oral   Take 1  tablet by mouth daily.         Marland Kitchen OMEPRAZOLE 20 MG PO CPDR   Oral   Take 20 mg by mouth daily.         Marland Kitchen POLYETHYLENE GLYCOL 3350 PO PACK   Oral   Take 34 g by mouth daily.   100 each   1   . TRAMADOL HCL 50 MG PO TABS   Oral   Take 1 tablet (50 mg total) by mouth every 6 (six) hours as needed for pain. For pain   30 tablet   0   . DIVALPROEX SODIUM 500 MG PO TBEC   Oral   Take 1 tablet (500 mg total) by mouth at bedtime.   30 tablet   0   . QUETIAPINE FUMARATE 300 MG PO TABS   Oral   Take 1 tablet (300 mg total) by mouth at bedtime.   30 tablet   0     BP 131/89  Pulse 93  Temp 97.9 F (36.6 C) (Oral)  Resp 20  SpO2 93%  Physical Exam  Nursing note and vitals reviewed. Constitutional: She is oriented to person, place, and time. She appears well-developed.       She is obese  HENT:  Head: Normocephalic and atraumatic.  Eyes: Conjunctivae normal and EOM are normal. Pupils are equal, round, and reactive to light.  Neck: Normal range of motion and phonation normal. Neck supple.   Cardiovascular: Normal rate, regular rhythm and intact distal pulses.   Pulmonary/Chest: Effort normal and breath sounds normal. No respiratory distress. She has no wheezes. She has no rales. She exhibits no tenderness.  Abdominal: Soft. She exhibits no distension. There is no tenderness. There is no guarding.  Musculoskeletal: Normal range of motion.  Neurological: She is alert and oriented to person, place, and time. She has normal strength. She exhibits normal muscle tone.  Skin: Skin is warm and dry.  Psychiatric: She has a normal mood and affect. Her behavior is normal. Judgment and thought content normal.    ED Course  Procedures (including critical care time)     Date: 04/06/2012  Rate: 113  Rhythm: sinus tachycardia  QRS Axis: normal  PR and QT Intervals: normal  ST/T Wave abnormalities: normal  PR and QRS Conduction Disutrbances:left posterior fascicular block  Narrative Interpretation: artifact  Old EKG Reviewed: changed- rate faster, and possible axis shift, however Artifact, may preclude direct comparison.   Labs Reviewed  CBC WITH DIFFERENTIAL - Abnormal; Notable for the following:    MCV 102.3 (*)     Platelets 106 (*)  PLATELET COUNT CONFIRMED BY SMEAR   Neutrophils Relative 41 (*)     All other components within normal limits  BASIC METABOLIC PANEL - Abnormal; Notable for the following:    Glucose, Bld 149 (*)     GFR calc non Af Amer 76 (*)     GFR calc Af Amer 88 (*)     All other components within normal limits  POCT I-STAT TROPONIN I   Dg Chest 2 View  04/23/2012  *RADIOLOGY REPORT*  Clinical Data: Mid to left-sided chest pain.  Shortness of breath. Prior MI.  Current history of hypertension, atrial fibrillation, asthma.  CHEST - 2 VIEW  Comparison: Portable chest x-ray 03/30/2012 and two-view chest x- ray 03/27/2012.  Findings: Suboptimal inspiration due to body habitus which accounts for crowded bronchovascular markings at the bases and accentuates the  cardiac silhouette.  Taking this into account, cardiac  silhouette mildly enlarged but stable.  Lungs clear.  No pleural effusions.  Pulmonary vascularity normal.  Degenerative changes involving the thoracic spine.  IMPRESSION: Suboptimal inspiration.  Stable cardiomegaly.  No acute cardiopulmonary disease.   Original Report Authenticated By: Hulan Saas, M.D.    Nursing notes, applicable records and vitals reviewed.  Radiologic Images/Reports reviewed.   1. Tachycardia   2. COPD (chronic obstructive pulmonary disease)       MDM  Nonspecific symptoms, with negative evaluation, negative. Doubt metabolic instability, serious bacterial infection or impending vascular collapse; the patient is stable for discharge.    Plan: Home Medications- usual; Home Treatments- rest; Recommended follow up- PCP prn      Flint Melter, MD 04/24/12 0105

## 2012-04-28 ENCOUNTER — Emergency Department (HOSPITAL_COMMUNITY)
Admission: EM | Admit: 2012-04-28 | Discharge: 2012-04-29 | Disposition: A | Discharge status: Home / Self Care | Payer: Medicare Other | Attending: Emergency Medicine | Admitting: Emergency Medicine

## 2012-04-28 ENCOUNTER — Encounter (HOSPITAL_COMMUNITY): Payer: Self-pay | Admitting: Emergency Medicine

## 2012-04-28 DIAGNOSIS — J4489 Other specified chronic obstructive pulmonary disease: Secondary | ICD-10-CM | POA: Insufficient documentation

## 2012-04-28 DIAGNOSIS — E669 Obesity, unspecified: Secondary | ICD-10-CM | POA: Insufficient documentation

## 2012-04-28 DIAGNOSIS — Z8669 Personal history of other diseases of the nervous system and sense organs: Secondary | ICD-10-CM | POA: Insufficient documentation

## 2012-04-28 DIAGNOSIS — G8929 Other chronic pain: Secondary | ICD-10-CM | POA: Insufficient documentation

## 2012-04-28 DIAGNOSIS — Y9289 Other specified places as the place of occurrence of the external cause: Secondary | ICD-10-CM | POA: Insufficient documentation

## 2012-04-28 DIAGNOSIS — Z7982 Long term (current) use of aspirin: Secondary | ICD-10-CM | POA: Insufficient documentation

## 2012-04-28 DIAGNOSIS — E119 Type 2 diabetes mellitus without complications: Secondary | ICD-10-CM | POA: Insufficient documentation

## 2012-04-28 DIAGNOSIS — W2209XA Striking against other stationary object, initial encounter: Secondary | ICD-10-CM | POA: Insufficient documentation

## 2012-04-28 DIAGNOSIS — Z87440 Personal history of urinary (tract) infections: Secondary | ICD-10-CM | POA: Insufficient documentation

## 2012-04-28 DIAGNOSIS — F259 Schizoaffective disorder, unspecified: Secondary | ICD-10-CM | POA: Insufficient documentation

## 2012-04-28 DIAGNOSIS — J449 Chronic obstructive pulmonary disease, unspecified: Secondary | ICD-10-CM | POA: Insufficient documentation

## 2012-04-28 DIAGNOSIS — I252 Old myocardial infarction: Secondary | ICD-10-CM | POA: Insufficient documentation

## 2012-04-28 DIAGNOSIS — S8010XA Contusion of unspecified lower leg, initial encounter: Secondary | ICD-10-CM | POA: Insufficient documentation

## 2012-04-28 DIAGNOSIS — E039 Hypothyroidism, unspecified: Secondary | ICD-10-CM | POA: Insufficient documentation

## 2012-04-28 DIAGNOSIS — Y939 Activity, unspecified: Secondary | ICD-10-CM | POA: Insufficient documentation

## 2012-04-28 DIAGNOSIS — I4891 Unspecified atrial fibrillation: Secondary | ICD-10-CM | POA: Insufficient documentation

## 2012-04-28 DIAGNOSIS — Z8701 Personal history of pneumonia (recurrent): Secondary | ICD-10-CM | POA: Insufficient documentation

## 2012-04-28 DIAGNOSIS — I509 Heart failure, unspecified: Secondary | ICD-10-CM | POA: Insufficient documentation

## 2012-04-28 DIAGNOSIS — K219 Gastro-esophageal reflux disease without esophagitis: Secondary | ICD-10-CM | POA: Insufficient documentation

## 2012-04-28 DIAGNOSIS — I10 Essential (primary) hypertension: Secondary | ICD-10-CM | POA: Insufficient documentation

## 2012-04-28 DIAGNOSIS — Z87891 Personal history of nicotine dependence: Secondary | ICD-10-CM | POA: Insufficient documentation

## 2012-04-28 DIAGNOSIS — T148XXA Other injury of unspecified body region, initial encounter: Secondary | ICD-10-CM

## 2012-04-28 DIAGNOSIS — Z79899 Other long term (current) drug therapy: Secondary | ICD-10-CM | POA: Insufficient documentation
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
## 2012-04-28 NOTE — ED Notes (Signed)
Bed:WA12<BR> Expected date:<BR> Expected time:<BR> Means of arrival:<BR> Comments:<BR>

## 2012-04-29 NOTE — ED Provider Notes (Signed)
Medical screening examination/treatment/procedure(s) were performed by non-physician practitioner and as supervising physician I was immediately available for consultation/collaboration.  Aiken Withem M Yailen Zemaitis, MD 04/29/12 0505 

## 2012-04-29 NOTE — ED Notes (Signed)
[facility] called for transport.  
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
## 2012-05-02 IMAGING — CR DG CHEST 2V
3 series · 3 of 3 positions shown · non-contrast
Comparison: 11/25/2010.

CLINICAL DATA: Cough and congestion.  Shortness of breath and
wheezing.

CHEST - 2 VIEW

[w chest pa *]
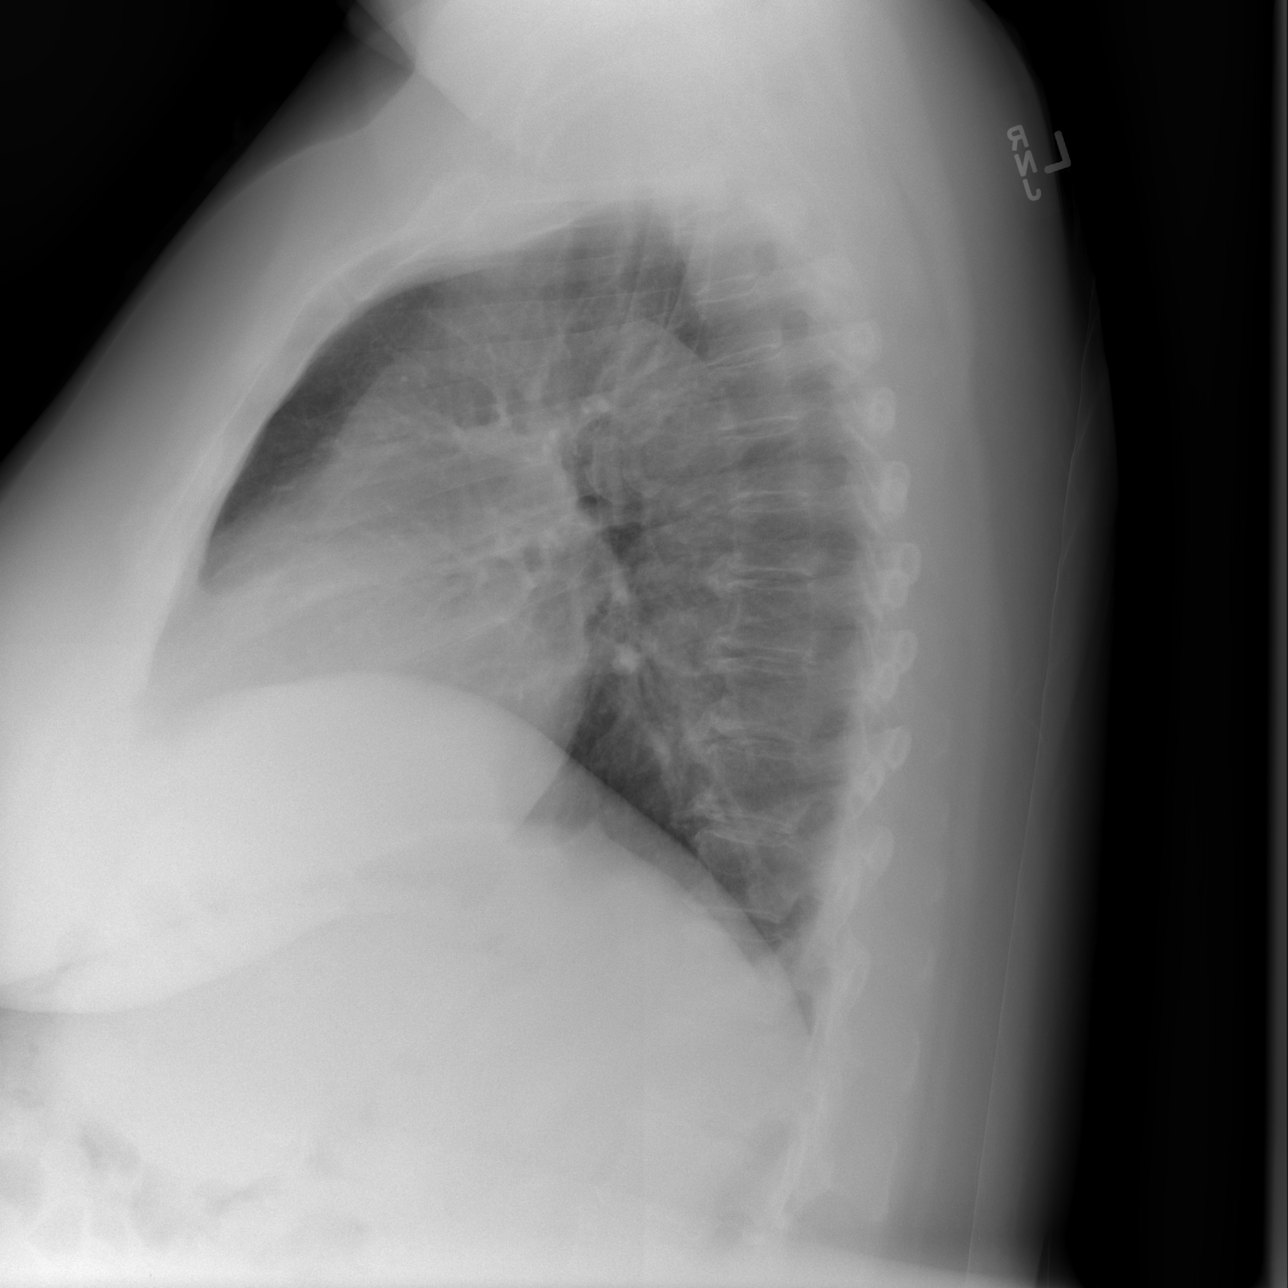

[view not recorded (1 of 2)]
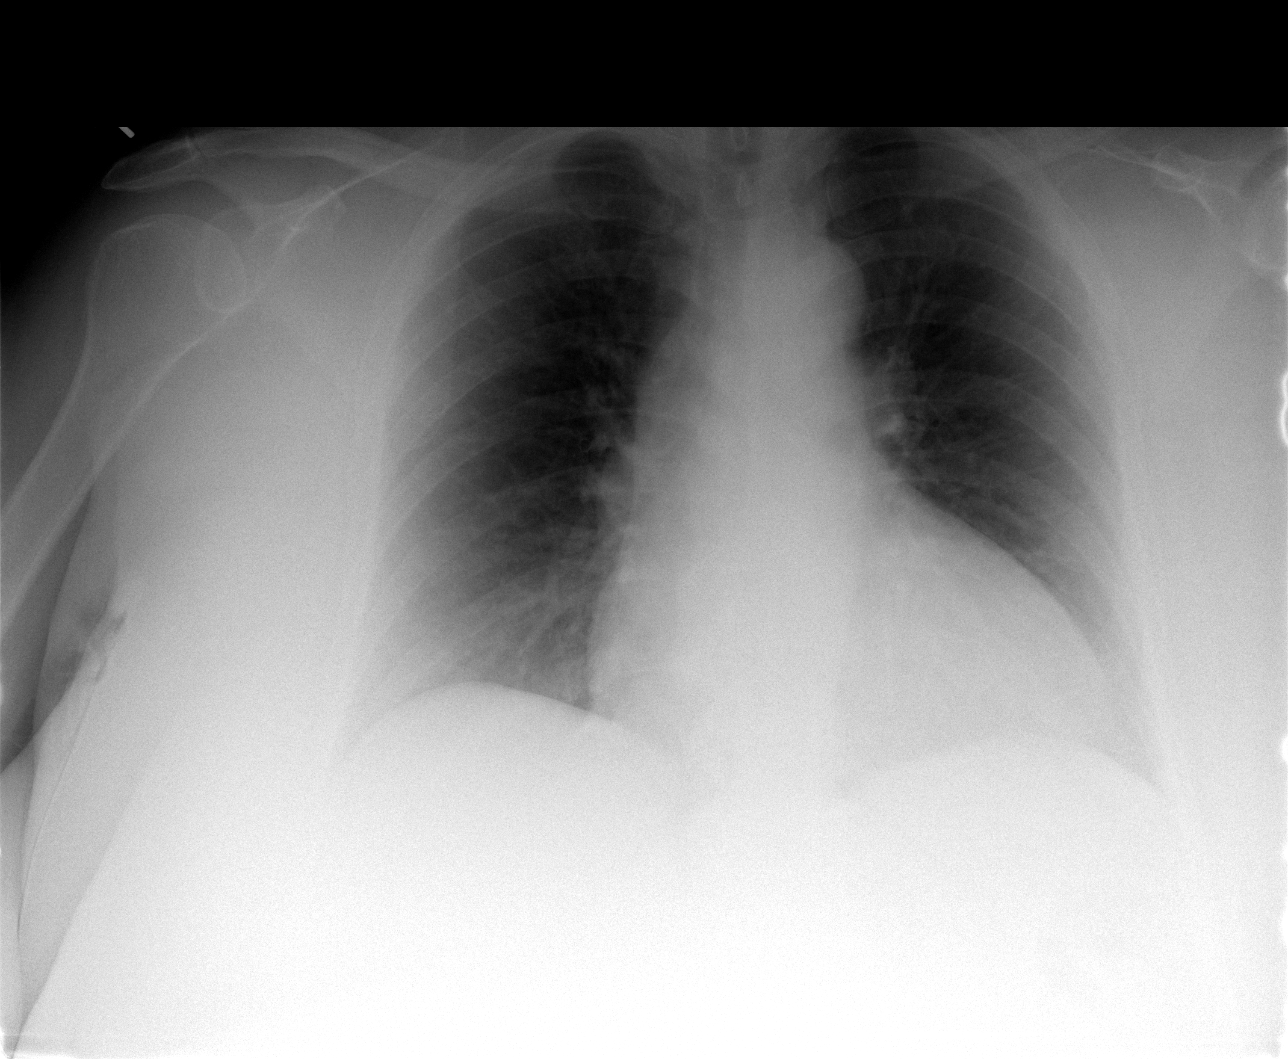

[view not recorded (2 of 2)]
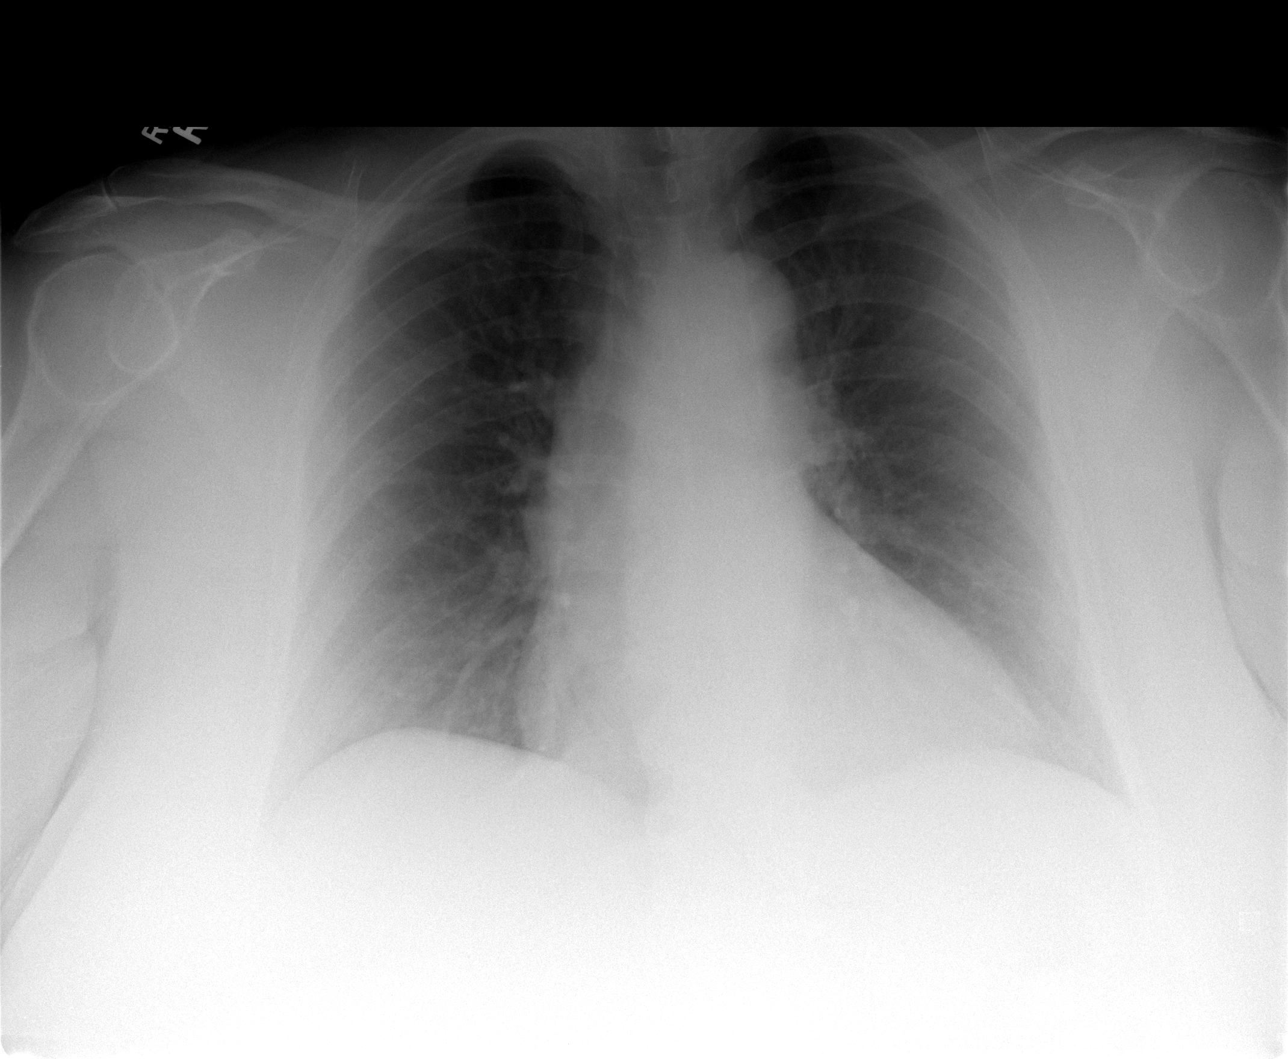

[3 of 3 positions shown; findings below may reference images not displayed]

FINDINGS: Trachea is midline.  Heart size stable.  Lungs are clear.
No pleural fluid.  Mild degenerative changes are seen in the spine.
IMPRESSION: No acute findings.

## 2012-05-05 ENCOUNTER — Emergency Department (HOSPITAL_COMMUNITY)
Admission: EM | Admit: 2012-05-05 | Discharge: 2012-05-05 | Disposition: A | Discharge status: Home / Self Care | Payer: Medicare Other | Attending: Emergency Medicine | Admitting: Emergency Medicine

## 2012-05-05 ENCOUNTER — Encounter (HOSPITAL_COMMUNITY): Payer: Self-pay | Admitting: Emergency Medicine

## 2012-05-05 ENCOUNTER — Emergency Department (HOSPITAL_COMMUNITY): Payer: Medicare Other

## 2012-05-05 DIAGNOSIS — E039 Hypothyroidism, unspecified: Secondary | ICD-10-CM | POA: Insufficient documentation

## 2012-05-05 DIAGNOSIS — G40909 Epilepsy, unspecified, not intractable, without status epilepticus: Secondary | ICD-10-CM | POA: Insufficient documentation

## 2012-05-05 DIAGNOSIS — E669 Obesity, unspecified: Secondary | ICD-10-CM | POA: Insufficient documentation

## 2012-05-05 DIAGNOSIS — Z79899 Other long term (current) drug therapy: Secondary | ICD-10-CM | POA: Insufficient documentation

## 2012-05-05 DIAGNOSIS — G8929 Other chronic pain: Secondary | ICD-10-CM | POA: Insufficient documentation

## 2012-05-05 DIAGNOSIS — I10 Essential (primary) hypertension: Secondary | ICD-10-CM | POA: Insufficient documentation

## 2012-05-05 DIAGNOSIS — R5381 Other malaise: Secondary | ICD-10-CM | POA: Insufficient documentation

## 2012-05-05 DIAGNOSIS — R0789 Other chest pain: Secondary | ICD-10-CM | POA: Insufficient documentation

## 2012-05-05 DIAGNOSIS — Z87891 Personal history of nicotine dependence: Secondary | ICD-10-CM | POA: Insufficient documentation

## 2012-05-05 DIAGNOSIS — R079 Chest pain, unspecified: Secondary | ICD-10-CM

## 2012-05-05 DIAGNOSIS — J4489 Other specified chronic obstructive pulmonary disease: Secondary | ICD-10-CM | POA: Insufficient documentation

## 2012-05-05 DIAGNOSIS — I252 Old myocardial infarction: Secondary | ICD-10-CM | POA: Insufficient documentation

## 2012-05-05 DIAGNOSIS — R0602 Shortness of breath: Secondary | ICD-10-CM | POA: Insufficient documentation

## 2012-05-05 DIAGNOSIS — K219 Gastro-esophageal reflux disease without esophagitis: Secondary | ICD-10-CM | POA: Insufficient documentation

## 2012-05-05 DIAGNOSIS — N390 Urinary tract infection, site not specified: Secondary | ICD-10-CM | POA: Insufficient documentation

## 2012-05-05 DIAGNOSIS — Z7982 Long term (current) use of aspirin: Secondary | ICD-10-CM | POA: Insufficient documentation

## 2012-05-05 DIAGNOSIS — R110 Nausea: Secondary | ICD-10-CM | POA: Insufficient documentation

## 2012-05-05 DIAGNOSIS — Z8701 Personal history of pneumonia (recurrent): Secondary | ICD-10-CM | POA: Insufficient documentation

## 2012-05-05 DIAGNOSIS — J449 Chronic obstructive pulmonary disease, unspecified: Secondary | ICD-10-CM | POA: Insufficient documentation

## 2012-05-05 DIAGNOSIS — R109 Unspecified abdominal pain: Secondary | ICD-10-CM | POA: Insufficient documentation

## 2012-05-05 DIAGNOSIS — I509 Heart failure, unspecified: Secondary | ICD-10-CM | POA: Insufficient documentation

## 2012-05-05 DIAGNOSIS — I4891 Unspecified atrial fibrillation: Secondary | ICD-10-CM | POA: Insufficient documentation

## 2012-05-05 LAB — CBC WITH DIFFERENTIAL/PLATELET
Basophils Absolute: 0 10*3/uL (ref 0.0–0.1)
Basophils Relative: 0 % (ref 0–1)
MCHC: 31.7 g/dL (ref 30.0–36.0)
Monocytes Absolute: 0.8 10*3/uL (ref 0.1–1.0)
Neutro Abs: 2.3 10*3/uL (ref 1.7–7.7)
Neutrophils Relative %: 49 % (ref 43–77)
RDW: 14.4 % (ref 11.5–15.5)

## 2012-05-05 LAB — BASIC METABOLIC PANEL
Chloride: 94 mEq/L — ABNORMAL LOW (ref 96–112)
Creatinine, Ser: 0.85 mg/dL (ref 0.50–1.10)
GFR calc Af Amer: 85 mL/min — ABNORMAL LOW (ref >90)
Potassium: 3.5 mEq/L (ref 3.5–5.1)

## 2012-05-05 LAB — TROPONIN I: Troponin I: <0.3 ng/mL (ref <0.30)

## 2012-05-05 MED ORDER — ACETAMINOPHEN 325 MG PO TABS
650.0000 mg | ORAL_TABLET | Freq: Once | ORAL | Status: AC
  Administered 2012-05-05: 650 mg via ORAL
  Filled 2012-05-05: qty 2

## 2012-05-05 NOTE — ED Notes (Signed)
PT. ARRIVED WITH EMS FROM HOME REPORTS MID CHEST PAIN / BODY ACHES WITH SOB AND VOMITTING ONSET LAST NIGHT , RECEIVED 4 BABY ASA AND 1 NTG SL WITH NO RELIEF.

## 2012-05-05 NOTE — ED Provider Notes (Signed)
History     CSN: [id number]  Arrival date & time 05/05/12  0235   First MD Initiated Contact with Patient 05/05/12 [phone]      Chief Complaint  Patient presents with  . Chest Pain     Patient is a 61 y.o. female presenting with chest pain. The history is provided by the patient.  Chest Pain Episode onset: several hours ago. Chest pain occurs constantly. The chest pain is unchanged. The severity of the pain is moderate. The quality of the pain is described as pressure-like. The pain does not radiate. Primary symptoms include shortness of breath, abdominal pain and nausea. Pertinent negatives for primary symptoms include no fever.  Associated symptoms include weakness. She tried nitroglycerin and aspirin for the symptoms.   pt presents for chest pain - she reports it started earlier tonight. It was described as pressure.  She also reports SOB She also reports abdominal pain, back pain, headache and nausea.  She also reports body aches as well She was brought by EMS who gave her NTG and ASA  Past Medical History  Diagnosis Date  . Schizoaffective disorder   . Urinary tract infection   . Hypertension   . Atrial fibrillation   . GERD (gastroesophageal reflux disease)   . Hypothyroidism   . Myocardial infarction   . Diabetes mellitus   . Obesity   . Asthma   . COPD (chronic obstructive pulmonary disease)   . Pneumonia   . CHF (congestive heart failure)   . Seizures   . Morbid obesity   . Chronic pain     Past Surgical History  Procedure Date  . Tubal ligation   . Abdominal hysterectomy     Family History  Problem Relation Age of Onset  . Heart disease Father   . Cancer Mother 34    Breast    History  Substance Use Topics  . Smoking status: Former Smoker -- 1.0 packs/day for 12 years    Quit date: 06/29/2000  . Smokeless tobacco: Current User    Types: Snuff, Chew  . Alcohol Use: No    OB History    Grav Para Term Preterm Abortions TAB SAB Ect Mult Living                 Review of Systems  Constitutional: Negative for fever.  Respiratory: Positive for shortness of breath.   Cardiovascular: Positive for chest pain.  Gastrointestinal: Positive for nausea and abdominal pain.  Neurological: Positive for weakness.  All other systems reviewed and are negative.    Allergies  Sulfamethoxazole w-trimethoprim and Penicillins  Home Medications   Current Outpatient Rx  Name  Route  Sig  Dispense  Refill  . ALBUTEROL SULFATE HFA 108 (90 BASE) MCG/ACT IN AERS   Inhalation   Inhale 1 puff into the lungs every 6 (six) hours as needed. For wheezing         . ASPIRIN EC 81 MG PO TBEC   Oral   Take 81 mg by mouth daily.          Marland Kitchen DIVALPROEX SODIUM 500 MG PO TBEC   Oral   Take 1 tablet (500 mg total) by mouth at bedtime.   30 tablet   0   . FLUTICASONE PROPIONATE 50 MCG/ACT NA SUSP   Nasal   Place 2 sprays into the nose daily as needed. For nasal congestion         . LEVOTHYROXINE SODIUM 150 MCG PO TABS  Oral   Take 150 mcg by mouth daily.         . ADULT MULTIVITAMIN W/MINERALS CH   Oral   Take 1 tablet by mouth daily.         Marland Kitchen OMEPRAZOLE 20 MG PO CPDR   Oral   Take 20 mg by mouth daily.         . QUETIAPINE FUMARATE 300 MG PO TABS   Oral   Take 1 tablet (300 mg total) by mouth at bedtime.   30 tablet   0   . TRAMADOL HCL 50 MG PO TABS   Oral   Take 1 tablet (50 mg total) by mouth every 6 (six) hours as needed for pain. For pain   30 tablet   0     BP 118/63  Pulse 89  Temp 98.5 F (36.9 C) (Oral)  Resp 17  SpO2 95%  Physical Exam CONSTITUTIONAL: Well developed/well nourished HEAD AND FACE: Normocephalic/atraumatic EYES: EOMI/PERRL ENMT: Mucous membranes moist NECK: supple no meningeal signs SPINE:entire spine nontender CV: S1/S2 noted, no murmurs/rubs/gallops noted LUNGS: Lungs are clear to auscultation bilaterally, no apparent distress ABDOMEN: soft, nontender, no rebound or guarding,  obese GU:no cva tenderness NEURO: Pt is awake/alert, moves all extremitiesx4 EXTREMITIES: pulses normal, full ROM SKIN: warm, color normal PSYCH: no abnormalities of mood noted  ED Course  Procedures   Labs Reviewed  CBC WITH DIFFERENTIAL - Abnormal; Notable for the following:    MCV 103.5 (*)     Platelets 132 (*)     Monocytes Relative 18 (*)     All other components within normal limits  BASIC METABOLIC PANEL  TROPONIN I   Dg Chest Port 1 View  05/05/2012  *RADIOLOGY REPORT*  Clinical Data: Chest pain  PORTABLE CHEST - 1 VIEW  Comparison: 04/23/2012  Findings: Cardiomegaly.  Central vascular congestion.  Mild interstitial prominence.  Retrocardiac space is obscured.  No pneumothorax.  Small effusions not excluded.  Limited osseous evaluation due to technique/patient body habitus.  Multilevel degenerative change.  IMPRESSION: Cardiomegaly with central vascular congestion.  Interstitial prominence may be accentuated by overlying soft tissues. Retrocardiac space is obscured.  Otherwise, no confluent airspace opacity.  Consider PA and lateral radiograph follow-up when the patient can tolerate.   Original Report Authenticated By: Jearld Lesch, M.D.     3:44 AM Pt with multiple ED visits, frequent visits for chest pain.  Reviewed last note from dr Sharyn Lull her cardiologist.  Also had recent CT chest that was negative for PE.  ekg unchanged.  CXR unremarkable.  Will check troponin and reassess.  She has other complaints such as body aches but does not appear in acute distress, vitals appropriate 5:20 AM Pt now requesting d/c home.  I advised her we should recheck troponin at 6am, but she would like to be discharged now.  Advised f/u with her cardiologist.  Doubt ACS/PE at this time.  MDM  Nursing notes including past medical history and social history reviewed and considered in documentation xrays reviewed and considered Previous records reviewed and considered - previous cardiology  notes reviewed, h/o cath in past with mild disease, recent stress negative in past year Labs/vital reviewed and considered        Date: 05/05/2012  Rate: 94  Rhythm: normal sinus rhythm  QRS Axis: right  Intervals: normal  ST/T Wave abnormalities: nonspecific ST changes  Conduction Disutrbances:none  Narrative Interpretation:   Old EKG Reviewed: unchanged    Suella [place]  Bebe Shaggy, MD 05/05/12 [phone]

## 2012-05-09 ENCOUNTER — Encounter (HOSPITAL_COMMUNITY): Payer: Self-pay

## 2012-05-09 ENCOUNTER — Emergency Department (HOSPITAL_COMMUNITY)
Admission: EM | Admit: 2012-05-09 | Discharge: 2012-05-11 | Disposition: A | Discharge status: Other Acute Inpatient Hospital | Payer: Medicare Other | Source: Home / Self Care | Attending: Emergency Medicine | Admitting: Emergency Medicine

## 2012-05-09 DIAGNOSIS — R45851 Suicidal ideations: Secondary | ICD-10-CM

## 2012-05-09 LAB — COMPREHENSIVE METABOLIC PANEL
ALT: 17 U/L (ref 0–35)
AST: 24 U/L (ref 0–37)
Albumin: 2.8 g/dL — ABNORMAL LOW (ref 3.5–5.2)
CO2: 34 mEq/L — ABNORMAL HIGH (ref 19–32)
Calcium: 9.7 mg/dL (ref 8.4–10.5)
GFR calc non Af Amer: 74 mL/min — ABNORMAL LOW (ref >90)
Sodium: 136 mEq/L (ref 135–145)
Total Protein: 6 g/dL (ref 6.0–8.3)

## 2012-05-09 LAB — CBC
MCH: 33.3 pg (ref 26.0–34.0)
Platelets: 165 10*3/uL (ref 150–400)
RBC: 3.93 MIL/uL (ref 3.87–5.11)

## 2012-05-09 LAB — RAPID URINE DRUG SCREEN, HOSP PERFORMED
Amphetamines: NOT DETECTED
Benzodiazepines: NOT DETECTED
Cocaine: NOT DETECTED
Opiates: NOT DETECTED
Tetrahydrocannabinol: NOT DETECTED

## 2012-05-09 MED ORDER — ACETAMINOPHEN 325 MG PO TABS
650.0000 mg | ORAL_TABLET | Freq: Once | ORAL | Status: AC
  Administered 2012-05-09: 650 mg via ORAL
  Filled 2012-05-09: qty 2

## 2012-05-09 MED ORDER — IBUPROFEN 200 MG PO TABS
400.0000 mg | ORAL_TABLET | Freq: Once | ORAL | Status: AC
  Administered 2012-05-09: 400 mg via ORAL
  Filled 2012-05-09: qty 2

## 2012-05-09 NOTE — ED Notes (Signed)
Pt requesting pain meds and sleep aide.  Notified MD.

## 2012-05-09 NOTE — ED Notes (Signed)
Sitter at bedside.
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
## 2012-05-09 NOTE — ED Notes (Signed)
Pt is IVC'd for thoughts of harming herself via cutting herself with a knife. Schizoaffective disorder. Whitman Hero from [employer] is the petitioner for the IVC. States her legs hurt as well.

## 2012-05-10 MED ORDER — IBUPROFEN 200 MG PO TABS
400.0000 mg | ORAL_TABLET | Freq: Three times a day (TID) | ORAL | Status: DC | PRN
  Administered 2012-05-10: 400 mg via ORAL
  Filled 2012-05-10: qty 2

## 2012-05-10 MED ORDER — ACETAMINOPHEN 325 MG PO TABS
650.0000 mg | ORAL_TABLET | ORAL | Status: DC | PRN
  Administered 2012-05-10 (×2): 650 mg via ORAL
  Filled 2012-05-10 (×2): qty 2

## 2012-05-10 MED ORDER — ONDANSETRON HCL 4 MG PO TABS: 4.0000 mg | ORAL_TABLET | Freq: Three times a day (TID) | ORAL | Status: DC | PRN

## 2012-05-10 NOTE — ED Provider Notes (Signed)
Filed Vitals:   05/10/12 0453  BP: 143/88  Pulse: 100  Temp: 98.1 F (36.7 C)  Resp:     Pt is here involuntarily from outpt services due to SI and command hallucinations.  ACT has seen and evaluated.  Awaiting confirmation of acceptance.  No issues overnight. No acute issues with blood work.  BP is minimally elevated, no need for emergent intervention.    [name]. Oletta Lamas, MD 05/10/12 [phone]

## 2012-05-10 NOTE — ED Notes (Signed)
ACT team is aware of consult.

## 2012-05-10 NOTE — ED Notes (Signed)
Pt is using the phone.  °

## 2012-05-10 NOTE — BH Assessment (Signed)
Assessment Note   [name] is a 61 y.o. female who presents to [facility] under involuntary petition. Petition was taken out by pt's case worker Laurelyn Sickle [phone]) at psychotherapeutic services. Per  Petition, pt expressed SI with a plan to cut her wrists with a knife. She reports no precipitating event, but states that she has chronic pain which increases her symptoms of depression. Pt has a history of schizoaffective disorder and past suicide attempts. Pt currently endorses SI with a plan and intent. She also endorses [facility], stating she sees her deceased grandparents and hears their voices. She reports in the past they have told her to kill herself. She reports that she has not slept "in many days." She currently receives outpatient services from psychotherapeutic services and has received inpatient treatment numerous times at [facility], [place], [facility], and [facility].   Pt denies HI and SA. She states she lives with her fiance and that he is very supportive of her. Pt's affect was inconsistent with her thought process. She would frequently smile when talking about wanting to kill herself.         Axis I: Schizoaffective Disorder Axis II: Deferred Axis III:  Past Medical History  Diagnosis Date  . Schizoaffective disorder   . Urinary tract infection   . Hypertension   . Atrial fibrillation   . GERD (gastroesophageal reflux disease)   . Hypothyroidism   . Myocardial infarction   . Diabetes mellitus   . Obesity   . Asthma   . COPD (chronic obstructive pulmonary disease)   . Pneumonia   . CHF (congestive heart failure)   . Seizures   . Morbid obesity   . Chronic pain    Axis IV: other psychosocial or environmental problems Axis V: 21-30 behavior considerably influenced by delusions or hallucinations OR serious impairment in judgment, communication OR inability to function in almost all areas  Past Medical History:  Past Medical History  Diagnosis Date  .  Schizoaffective disorder   . Urinary tract infection   . Hypertension   . Atrial fibrillation   . GERD (gastroesophageal reflux disease)   . Hypothyroidism   . Myocardial infarction   . Diabetes mellitus   . Obesity   . Asthma   . COPD (chronic obstructive pulmonary disease)   . Pneumonia   . CHF (congestive heart failure)   . Seizures   . Morbid obesity   . Chronic pain     Past Surgical History  Procedure Date  . Tubal ligation   . Abdominal hysterectomy     Family History:  Family History  Problem Relation Age of Onset  . Heart disease Father   . Cancer Mother 19    Breast    Social History:  reports that she quit smoking about 11 years ago. Her smokeless tobacco use includes Snuff and Chew. She reports that she does not drink alcohol or use illicit drugs.  Additional Social History:  Alcohol / Drug Use History of alcohol / drug use?: No history of alcohol / drug abuse  CIWA: CIWA-Ar BP: 125/76 mmHg Pulse Rate: 92  COWS:    Allergies:  Allergies  Allergen Reactions  . Sulfamethoxazole W-Trimethoprim Hives and Nausea And Vomiting  . Penicillins Hives    Home Medications:  (Not in a hospital admission)  OB/GYN Status:  No LMP recorded. Patient has had a hysterectomy.  General Assessment Data Location of Assessment: [facility] Living Arrangements: Spouse/significant other Can pt return to current  living arrangement?: Yes Admission Status: Involuntary Is patient capable of signing voluntary admission?: Yes Transfer from: Acute Hospital Referral Source: MD  Education Status Is patient currently in school?: No  Risk to self Suicidal Ideation: Yes-Currently Present Suicidal Intent: Yes-Currently Present Is patient at risk for suicide?: Yes Suicidal Plan?: Yes-Currently Present Specify Current Suicidal Plan: cut wrists Access to Means: Yes Specify Access to Suicidal Means: knives What has been your use of drugs/alcohol within the last 12 months?:  denies Previous Attempts/Gestures: Yes Triggers for Past Attempts: Hallucinations Intentional Self Injurious Behavior: None Family Suicide History: No Recent stressful life event(s): Other (Comment) (chronic pain) Persecutory voices/beliefs?: No Depression: Yes Depression Symptoms: Feeling angry/irritable;Tearfulness Substance abuse history and/or treatment for substance abuse?: No Suicide prevention information given to non-admitted patients: Not applicable  Risk to Others Homicidal Ideation: No Thoughts of Harm to Others: No Current Homicidal Intent: No Current Homicidal Plan: No Access to Homicidal Means: No Identified Victim: none History of harm to others?: No Assessment of Violence: None Noted Violent Behavior Description: coopeartive Does patient have access to weapons?: No Criminal Charges Pending?: No Does patient have a court date: No  Psychosis Hallucinations: Auditory;Visual Delusions: None noted  Mental Status Report Appear/Hygiene: Bizarre Eye Contact: Fair Motor Activity: Unremarkable Speech: Soft;Slurred Level of Consciousness: Quiet/awake Mood: Elated Affect: Inconsistent with thought content Anxiety Level: None Thought Processes: Coherent Judgement: Impaired Orientation: Person;Place;Time;Situation Obsessive Compulsive Thoughts/Behaviors: None  Cognitive Functioning Concentration: Decreased Memory: Recent Intact;Remote Intact IQ: Average Insight: Poor Impulse Control: Poor Appetite: Fair Weight Loss: 0  Weight Gain: 0  Sleep: Decreased Total Hours of Sleep: 0  (states no sleep for many nights) Vegetative Symptoms: None  ADLScreening [facility] Assessment Services) Patient's cognitive ability adequate to safely complete daily activities?: Yes Patient able to express need for assistance with ADLs?: Yes Independently performs ADLs?: Yes (appropriate for developmental age)  Abuse/Neglect [facility]) Physical Abuse: Denies Verbal Abuse: Denies Sexual  Abuse: Denies  Prior Inpatient Therapy Prior Inpatient Therapy: Yes Prior Therapy Dates: 2012 (nemerous dates, most recently 11/12) Prior Therapy Facilty/Provider(s): [facility], [place], [facility] Reason for Treatment: SI and [facility]  Prior Outpatient Therapy Prior Outpatient Therapy: Yes Prior Therapy Dates: ongoing Prior Therapy Facilty/Provider(s): [facility] Reason for Treatment: ACTT, medication mangement  ADL Screening (condition at time of admission) Patient's cognitive ability adequate to safely complete daily activities?: Yes Patient able to express need for assistance with ADLs?: Yes Independently performs ADLs?: Yes (appropriate for developmental age) Weakness of Legs: None Weakness of Arms/Hands: None  Home Assistive Devices/Equipment Home Assistive Devices/Equipment: None    Abuse/Neglect Assessment (Assessment to be complete while patient is alone) Physical Abuse: Denies Verbal Abuse: Denies Sexual Abuse: Denies Exploitation of patient/patient's resources: Denies Self-Neglect: Denies Values / Beliefs Cultural Requests During Hospitalization: None Spiritual Requests During Hospitalization: None   Advance Directives (For Healthcare) Advance Directive: Patient has advance directive, copy not in chart Type of Advance Directive: Healthcare Power of Attorney Pre-existing out of facility DNR order (yellow form or pink MOST form): No Nutrition Screen- [facility] Adult/[facility] Patient's home diet: Regular Have you recently lost weight without trying?: No Have you been eating poorly because of a decreased appetite?: No Malnutrition Screening Tool Score: 0   Additional Information 1:1 In Past 12 Months?: No CIRT Risk: No Elopement Risk: No Does patient have medical clearance?: Yes     Disposition:  Disposition Disposition of Patient: Referred to;Inpatient treatment program Type of inpatient treatment program: Adult  On Site Evaluation by:   Reviewed with Physician:  Georgina Quint A 05/10/2012 1:15 AM

## 2012-05-10 NOTE — Progress Notes (Signed)
[facility] ED CM reviewed ED  61 year old Female c/o IVC for SI abnormal labs: mcv 102.3 glucose 107 cl 95 CO@ 34 albumin 2.8 GFR 74-85 HR 100 sbp 110-143 Last hospitalization for chest pain with d/c on 03/31/12 [facility] PCP:   None Cardiologist: Dr. Sharyn Lull

## 2012-05-10 NOTE — ED Notes (Signed)
Pt used the phone ?

## 2012-05-10 NOTE — ED Notes (Signed)
Took pt a cup of milk

## 2012-05-10 NOTE — ED Notes (Addendum)
Put pt on bed pan

## 2012-05-11 ENCOUNTER — Inpatient Hospital Stay (HOSPITAL_COMMUNITY)
Admission: AD | Admit: 2012-05-11 | Discharge: 2012-05-11 | DRG: 885 | Disposition: A | Discharge status: Home / Self Care | Payer: Medicare Other | Source: Ambulatory Visit | Attending: Psychiatry | Admitting: Psychiatry

## 2012-05-11 ENCOUNTER — Encounter (HOSPITAL_COMMUNITY): Payer: Self-pay | Admitting: *Deleted

## 2012-05-11 DIAGNOSIS — I509 Heart failure, unspecified: Secondary | ICD-10-CM | POA: Diagnosis present

## 2012-05-11 DIAGNOSIS — E119 Type 2 diabetes mellitus without complications: Secondary | ICD-10-CM | POA: Diagnosis present

## 2012-05-11 DIAGNOSIS — J45909 Unspecified asthma, uncomplicated: Secondary | ICD-10-CM | POA: Diagnosis present

## 2012-05-11 DIAGNOSIS — I10 Essential (primary) hypertension: Secondary | ICD-10-CM | POA: Diagnosis present

## 2012-05-11 DIAGNOSIS — F250 Schizoaffective disorder, bipolar type: Secondary | ICD-10-CM

## 2012-05-11 DIAGNOSIS — F259 Schizoaffective disorder, unspecified: Principal | ICD-10-CM | POA: Diagnosis present

## 2012-05-11 DIAGNOSIS — Z7982 Long term (current) use of aspirin: Secondary | ICD-10-CM

## 2012-05-11 DIAGNOSIS — Z79899 Other long term (current) drug therapy: Secondary | ICD-10-CM

## 2012-05-11 MED ORDER — ALBUTEROL SULFATE (5 MG/ML) 0.5% IN NEBU: 2.5000 mg | INHALATION_SOLUTION | Freq: Four times a day (QID) | RESPIRATORY_TRACT | Status: DC | PRN

## 2012-05-11 MED ORDER — FOLIC ACID 1 MG PO TABS
1.0000 mg | ORAL_TABLET | Freq: Every day | ORAL | Status: DC
  Filled 2012-05-11 (×2): qty 3

## 2012-05-11 MED ORDER — LISINOPRIL 20 MG PO TABS: 20.0000 mg | ORAL_TABLET | Freq: Two times a day (BID) | ORAL | Status: DC

## 2012-05-11 MED ORDER — QUETIAPINE FUMARATE 300 MG PO TABS
300.0000 mg | ORAL_TABLET | Freq: Every day | ORAL | Status: DC
  Filled 2012-05-11 (×2): qty 1

## 2012-05-11 MED ORDER — DIVALPROEX SODIUM 500 MG PO DR TAB: 500.0000 mg | DELAYED_RELEASE_TABLET | Freq: Every day | ORAL | Status: DC

## 2012-05-11 MED ORDER — ASPIRIN EC 81 MG PO TBEC: 81.0000 mg | DELAYED_RELEASE_TABLET | Freq: Every day | ORAL | Status: DC

## 2012-05-11 MED ORDER — ADULT MULTIVITAMIN W/MINERALS CH: 1.0000 | ORAL_TABLET | Freq: Every day | ORAL | Status: DC

## 2012-05-11 MED ORDER — ALBUTEROL SULFATE HFA 108 (90 BASE) MCG/ACT IN AERS: 2.0000 | INHALATION_SPRAY | Freq: Four times a day (QID) | RESPIRATORY_TRACT | Status: DC | PRN

## 2012-05-11 MED ORDER — ADULT MULTIVITAMIN W/MINERALS CH
1.0000 | ORAL_TABLET | Freq: Every day | ORAL | Status: DC
  Administered 2012-05-11: 1 via ORAL
  Filled 2012-05-11 (×3): qty 1

## 2012-05-11 MED ORDER — PANTOPRAZOLE SODIUM 40 MG PO TBEC
40.0000 mg | DELAYED_RELEASE_TABLET | Freq: Every day | ORAL | Status: DC
  Administered 2012-05-11: 40 mg via ORAL
  Filled 2012-05-11 (×4): qty 1

## 2012-05-11 MED ORDER — FLUTICASONE PROPIONATE 50 MCG/ACT NA SUSP: 2.0000 | Freq: Every day | NASAL | Status: DC | PRN

## 2012-05-11 MED ORDER — LEVOTHYROXINE SODIUM 150 MCG PO TABS: 150.0000 ug | ORAL_TABLET | Freq: Every day | ORAL | Status: DC

## 2012-05-11 MED ORDER — ASPIRIN EC 81 MG PO TBEC
81.0000 mg | DELAYED_RELEASE_TABLET | Freq: Every day | ORAL | Status: DC
  Administered 2012-05-11: 81 mg via ORAL
  Filled 2012-05-11 (×4): qty 1

## 2012-05-11 MED ORDER — LEVOTHYROXINE SODIUM 150 MCG PO TABS
150.0000 ug | ORAL_TABLET | Freq: Every day | ORAL | Status: DC
  Administered 2012-05-11: 150 ug via ORAL
  Filled 2012-05-11: qty 1
  Filled 2012-05-11: qty 2
  Filled 2012-05-11 (×2): qty 1

## 2012-05-11 MED ORDER — LEVOTHYROXINE SODIUM 150 MCG PO TABS
150.0000 ug | ORAL_TABLET | Freq: Every day | ORAL | Status: DC
  Filled 2012-05-11 (×2): qty 3

## 2012-05-11 MED ORDER — DIVALPROEX SODIUM 500 MG PO DR TAB
500.0000 mg | DELAYED_RELEASE_TABLET | Freq: Every day | ORAL | Status: DC
  Filled 2012-05-11 (×2): qty 1

## 2012-05-11 MED ORDER — ADULT MULTIVITAMIN W/MINERALS CH
1.0000 | ORAL_TABLET | Freq: Every day | ORAL | Status: DC
  Filled 2012-05-11: qty 3

## 2012-05-11 MED ORDER — QUETIAPINE FUMARATE 300 MG PO TABS
300.0000 mg | ORAL_TABLET | Freq: Every day | ORAL | Status: DC
  Filled 2012-05-11: qty 3

## 2012-05-11 MED ORDER — FOLIC ACID 1 MG PO TABS: 1.0000 mg | ORAL_TABLET | Freq: Every day | ORAL | Status: DC

## 2012-05-11 MED ORDER — PANTOPRAZOLE SODIUM 40 MG PO TBEC
40.0000 mg | DELAYED_RELEASE_TABLET | Freq: Every day | ORAL | Status: DC
  Filled 2012-05-11: qty 3

## 2012-05-11 MED ORDER — DIVALPROEX SODIUM 500 MG PO DR TAB
500.0000 mg | DELAYED_RELEASE_TABLET | Freq: Every day | ORAL | Status: DC
  Filled 2012-05-11: qty 3

## 2012-05-11 MED ORDER — MAGNESIUM HYDROXIDE 400 MG/5ML PO SUSP: 30.0000 mL | Freq: Every day | ORAL | Status: DC | PRN

## 2012-05-11 MED ORDER — QUETIAPINE FUMARATE 300 MG PO TABS: 300.0000 mg | ORAL_TABLET | Freq: Every day | ORAL | Status: DC

## 2012-05-11 MED ORDER — TRAMADOL HCL 50 MG PO TABS: 50.0000 mg | ORAL_TABLET | Freq: Two times a day (BID) | ORAL | Status: DC | PRN

## 2012-05-11 MED ORDER — LISINOPRIL 20 MG PO TABS
20.0000 mg | ORAL_TABLET | Freq: Two times a day (BID) | ORAL | Status: DC
  Filled 2012-05-11: qty 3

## 2012-05-11 MED ORDER — ACETAMINOPHEN 325 MG PO TABS: 650.0000 mg | ORAL_TABLET | Freq: Four times a day (QID) | ORAL | Status: DC | PRN

## 2012-05-11 MED ORDER — FOLIC ACID 1 MG PO TABS
1.0000 mg | ORAL_TABLET | Freq: Every day | ORAL | Status: DC
  Filled 2012-05-11 (×3): qty 1

## 2012-05-11 MED ORDER — OMEPRAZOLE 20 MG PO CPDR: 20.0000 mg | DELAYED_RELEASE_CAPSULE | Freq: Every day | ORAL | Status: DC

## 2012-05-11 MED ORDER — ASPIRIN EC 81 MG PO TBEC
81.0000 mg | DELAYED_RELEASE_TABLET | Freq: Every day | ORAL | Status: DC
  Filled 2012-05-11: qty 3

## 2012-05-11 MED ORDER — LISINOPRIL 20 MG PO TABS
20.0000 mg | ORAL_TABLET | Freq: Two times a day (BID) | ORAL | Status: DC
  Administered 2012-05-11: 20 mg via ORAL
  Filled 2012-05-11 (×6): qty 1

## 2012-05-11 MED ORDER — ALUM & MAG HYDROXIDE-SIMETH 200-200-20 MG/5ML PO SUSP: 30.0000 mL | ORAL | Status: DC | PRN

## 2012-05-11 NOTE — Treatment Plan (Signed)
Interdisciplinary Treatment Plan Update (Adult)  Date: 05/11/2012  Time Reviewed: 10:13 AM   Progress in Treatment: Attending groups: Yes Participating in groups: Yes Taking medication as prescribed: Yes Tolerating medication: Yes   Family/Significant other contact made: Yes  Patient understands diagnosis:  Yes As evidenced by asking to go home "since I am no longer thinking of hurting myself" Discussing patient identified problems/goals with staff:  Yes See below Medical problems stabilized or resolved:  Yes Denies suicidal/homicidal ideation: Yes  In t x team Issues/concerns per patient self-inventory:  Yes  Wants to go home Other:  New problem(s) identified: N/A  Reason for Continuation of Hospitalization: Other; describe D/C today  Interventions implemented related to continuation of hospitalization:   Additional comments:  Estimated length of stay:  Discharge Plan: return home, follow up [facility]  New goal(s): N/A  Review of initial/current patient goals per problem list:   1.  Goal(s): Eliminate SI  Met:  Yes  Target date:11/22  As evidenced [id number] report  2.  Goal (s): Stabilize mood   Met:  Yes  Target date:11/22  As evidenced [id number] rates her depression as a 0 and denies any anxiety or mood related problems  3.  Goal(s): Secure follow up appointment with ACT team  Met:  Yes  Target date:11/22  As evidenced [id number] of date, time  4.  Goal(s):  Met:  Yes  Target date:  As evidenced by:  Attendees: Patient: [name]  05/11/2012 10:13 AM  Family:     Physician:  Thedore Mins 05/11/2012 10:13 AM   Nursing:  Shelda Jakes  05/11/2012 10:13 AM   Clinical Social Worker:  Richelle Ito 05/11/2012 10:13 AM   Extender:  Verne Spurr PA 05/11/2012 10:13 AM   Other:     Other:     Other:     Other:      Scribe for Treatment Team:   Ida Rogue, 05/11/2012 10:13 AM

## 2012-05-11 NOTE — Progress Notes (Signed)
[facility] Adult Inpatient Family/Significant Other Suicide Prevention Education  Suicide Prevention Education:  Education Completed; [name], boyfriend, [phone]  has been identified by the patient as the family member/significant other with whom the patient will be residing, and identified as the person(s) who will aid the patient in the event of a mental health crisis (suicidal ideations/suicide attempt).  With written consent from the patient, the family member/significant other has been provided the following suicide prevention education, prior to the and/or following the discharge of the patient.  The suicide prevention education provided includes the following:  Suicide risk factors  Suicide prevention and interventions  National Suicide Hotline telephone number  [facility] assessment telephone number  [facility] Emergency Assistance 911  [facility] and/or Residential Mobile Crisis Unit telephone number  Request made of family/significant other to:  Remove weapons (e.g., guns, rifles, knives), all items previously/currently identified as safety concern.    Remove drugs/medications (over-the-counter, prescriptions, illicit drugs), all items previously/currently identified as a safety concern.  The family member/significant other verbalizes understanding of the suicide prevention education information provided.  The family member/significant other agrees to remove the items of safety concern listed above.  Daryel Gerald B 05/11/2012, 11:48 AM
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
## 2012-05-11 NOTE — Progress Notes (Signed)
D   Received report on pt from admitting rn   Pt is resting in bed presently with no complaints or distress noted A   Continue Q 15 min checks R   Safe at present

## 2012-05-11 NOTE — Progress Notes (Signed)
Adult Psychosocial Assessment Update Interdisciplinary Team  Previous [facility] admissions/discharges:  Admissions Discharges  Date:current Date:  Date:06/17/11 Date:  Date: Date:  Date: Date:  Date: Date:   Changes since the last Psychosocial Assessment (including adherence to outpatient mental health and/or substance abuse treatment, situational issues contributing to decompensation and/or relapse). Medical complications have led to increased mental health issues.  Was feeling hopeless and suicidal previous to admission             Discharge Plan 1. Will you be returning to the same living situation after discharge?   ZOX:WRUE No:      If no, what is your plan?           2. Would you like a referral for services when you are discharged? Yes:     If yes, for what services?  No:   Already working with [facility]           Summary and Recommendations (to be completed by the evaluator)  [name] got her last night.  D/c today.  Denies SI.  Mood stable.  No psychosis.  Collateral info gained from ACT team and follow up appointment made.  [name] confirmed she already has home health care services.                       Signature:  Ida Rogue, 05/11/2012 11:51 AM

## 2012-05-11 NOTE — ED Provider Notes (Signed)
[name] is a 61 y.o. female who has now been accepted to the behavioral health hospital.  Flint Melter, MD 05/11/12 [phone]

## 2012-05-11 NOTE — ED Notes (Signed)
Patient leaving [facility] with [facility] and [employer].

## 2012-05-11 NOTE — Progress Notes (Signed)
Pt admitted on involuntary commitment, pt had a SI plan to cut wrists. On admission, pt states that it was a big, long story as to why she was here and did not want to talk about it again. Pt does report feelings of depression and also suicidal thoughts but able to contract for safety on the unit. On admission, she denies any A/V hallucinations but does endorse them in the recent past. Pt does live with her fiancee and plans to go back there. Pt needed assistance with ambulation during admission process, pt did state that she was unable to walk or bear weight but was able to do so with staff assistance.  Pt did have some reddened areas on inner thighs, also stated that it was itching. Pt did state that she has troubles with urination, including increased frequency and having difficulties making it to the bathroom in time. Pt also has generalized weakness and did complain of chronic pain in both legs. Pt was oriented to the unit and safety maintained.

## 2012-05-11 NOTE — Progress Notes (Signed)
[facility] Case Management Discharge Plan:  Will you be returning to the same living situation after discharge: Yes,  home At discharge, do you have transportation home?:Yes,  [facility] Do you have the ability to pay for your medications:Yes,  ACT team  Interagency Information:     Release of information consent forms completed and in the chart;  Patient's signature needed at discharge.  Patient to Follow up at:  Follow-up Information    Follow up with [facility]. [name] will come see you over the weekend, or by Monday at the latest.  She will call you before coming by)    Contact information:   [phone]         Patient denies SI/HI:   Yes,  yes    Safety Planning and Suicide Prevention discussed:  Yes,  yes  Barrier to discharge identified:No.  Summary and Recommendations:   [name] 05/11/2012, 11:44 AM

## 2012-05-11 NOTE — Progress Notes (Signed)
Patient ID: [name], female   DOB: 23-Jan-1951, 61 y.o.   MRN: [id number]  PER STATE REGULATIONS 482.30  THIS CHART WAS REVIEWED FOR MEDICAL NECESSITY WITH RESPECT TO THE PATIENT'S ADMISSION/ DURATION OF STAY.  NEXT REVIEW DATE: 05/14/2012  Willa Rough, RN, BSN CASE MANAGER

## 2012-05-11 NOTE — Progress Notes (Signed)
1:1 Note D Pt is sitting in cafe ( in her wheelchair) eating her lunch, with MHT at her side. She denies pain, denies suicidality and asks over and over " when am I going home? Can I keep my wheelchair?". She has taken her meds as they are ordered thus far today. She has been observed walking ( slowly) out in the hall, with 1:1 MHT at her side. She is flat, depressed, but otherwise cooperative. She is without teeth and her speech is difficult to understand.       A DC plans are moving forward.. Case manager has arranged transportation and is collaborating with pt's home health company ( already providing services to pt)  To provide wheelchair for pt to keep in her home. Pt completed her AM self inventory and on it she wrote she denied SI, she rated her depression  And hopelessness "0/0" and she stated her DC plan is to " take my meds, watch my diet and take better care of myself".       R 1:1 cont until pt is discharged due to high fall risk. Impending discharge slated for 1430 today, when transportation  Is scheduled to arrive.

## 2012-05-11 NOTE — Progress Notes (Signed)
Pt was inc of urine and required several staff to change and clean her   She is unable to walk reposition and do her adls without assistance  Pt is very obese and needs a bariatic bed  Information passed on to ac and next shift   Pt has severe swelling in her legs particularlly  her calves and ankles  She gets very sob with the slightest of movements   She snores badly and said she uses a cpap machine at home   Her urine has a fowl odor and she is prone to having uti's so a urinalysis should be done   Will pass information on to next shift and continue to monitor Q 15 min  Pt is safe at present

## 2012-05-11 NOTE — Progress Notes (Signed)
[facility] Case Management Discharge Plan:  Will you be returning to the same living situation after discharge: Yes,  home At discharge, do you have transportation home?:Yes,  [facility] Do you have the ability to pay for your medications:Yes,  ACT team  Interagency Information:     Release of information consent forms completed and in the chart;  Patient's signature needed at discharge.  Patient to Follow up at:  Follow-up Information    Follow up with [facility]. [name] will come see you over the weekend, or by Monday at the latest.  She will call you before coming by)    Contact information:   [phone]         Patient denies SI/HI:   Yes,  yes    Safety Planning and Suicide Prevention discussed:  Yes,  yes  Barrier to discharge identified:No.  Summary and Recommendations:   [name] 05/11/2012, 3:33 PM

## 2012-05-11 NOTE — [facility] Suicide Risk Assessment (Signed)
Suicide Risk Assessment  Admission Assessment     Nursing information obtained from:  Patient Demographic factors:  Caucasian;Unemployed Current Mental Status:  Self-harm thoughts Loss Factors:  Decline in physical health Historical Factors:  Family history of mental illness or substance abuse;Prior suicide attempts Risk Reduction Factors:  Positive social support;Positive therapeutic relationship;Living with another person, especially a relative  CLINICAL FACTORS:   reports auditory hallucinations for the past 3 days until yesterday, patient denies psychosis or suicidal thoughts today.  COGNITIVE FEATURES THAT CONTRIBUTE TO RISK:  Polarized thinking    SUICIDE RISK:   Minimal: No identifiable suicidal ideation.  Patients presenting with no risk factors but with morbid ruminations; may be classified as minimal risk based on the severity of the depressive symptoms  PLAN OF CARE:1. Admit for crisis management and stabilization. 2. Medication management to reduce current symptoms to base line and improve the  patient's overall level of functioning 3. Treat health problems as indicated. 4. Develop treatment plan to decrease risk of relapse upon discharge and the need for readmission. 5. Psycho-social education regarding relapse prevention and self care. 6. Health care follow up as needed for medical problems. 7. Restart home medications where appropriate.     Thedore Mins, MD 05/11/2012, 9:15 AM

## 2012-05-11 NOTE — Progress Notes (Signed)
Nrsg DC note Pt given AVS and signed appropriately, after stating she understood  And will comply. She denies SI, HI and / or presence of audit, vis, tactile halluc. She is given prescriptions as well as 3 day samples of meds . Transportation co arrived and pt boarded in [facility] w/c ( per CM, w/c to be returned to [facility]).

## 2012-05-11 NOTE — [facility] Suicide Risk Assessment (Signed)
Suicide Risk Assessment  Discharge Assessment     Demographic Factors:  Low socioeconomic status  Mental Status Per Nursing Assessment::   On Admission:  Self-harm thoughts  Current Mental Status by Physician: patient currently denies suicidal ideations, intent and plan  Loss Factors: Poor physical health  Historical Factors: Prior suicide attempts  Risk Reduction Factors:   Positive social support and Positive therapeutic relationship  Continued Clinical Symptoms:  Resolving psychosis  Cognitive Features That Contribute To Risk:  Closed-mindedness    Suicide Risk:  Minimal: No identifiable suicidal ideation.  Patients presenting with no risk factors but with morbid ruminations; may be classified as minimal risk based on the severity of the depressive symptoms  Discharge Diagnoses:   AXIS I:  Schizoaffective Disorder AXIS II:  Deferred AXIS III:   Past Medical History  Diagnosis Date  . Urinary tract infection   . Hypertension   . Atrial fibrillation   . GERD (gastroesophageal reflux disease)   . Hypothyroidism   . Myocardial infarction   . Diabetes mellitus   . Obesity   . Asthma   . COPD (chronic obstructive pulmonary disease)   . Pneumonia   . CHF (congestive heart failure)   . Seizures   . Morbid obesity   . Chronic pain    AXIS IV:  other psychosocial or environmental problems AXIS V:  61-70 mild symptoms  Plan Of Care/Follow-up recommendations:  Activity:  as tolerated Diet:  healthy Tests:  Valproic acid. Other:  Patient to keep her after care appointment and takes her medications as recommended.  Is patient on multiple antipsychotic therapies at discharge:  No   Has Patient had three or more failed trials of antipsychotic monotherapy by history:  No  Recommended Plan for Multiple Antipsychotic Therapies: N/A  Thedore Mins, MD 05/11/2012, 11:10 AM

## 2012-05-11 NOTE — Clinical Social Work Note (Signed)
Pt attended morning aftercare planning group. Pt repeatedly stated that she wanted to discharge today. Pt reported that she has ACT team through [facility]. Doctor approved discharge today if possible.

## 2012-05-14 NOTE — Progress Notes (Signed)
Patient Discharge Instructions:  After Visit Summary (AVS):   Faxed to:  05/14/12 Psychiatric Admission Assessment Note:   Faxed to:  05/14/12 Suicide Risk Assessment - Discharge Assessment:   Faxed to:  05/14/12 Faxed/Sent to the Next Level Care provider:  05/14/12 Faxed to [facility] @ [phone]  Jerelene Redden, 05/14/2012, 4:11 PM

## 2012-05-14 NOTE — Discharge Summary (Signed)
Physician Discharge Summary Note  Patient:  [name] is an 61 y.o., female MRN:  [id number] DOB:  Apr 15, 1951 Patient phone:  [phone] (home)  Patient address:   [address] [place] [address],   Date of Admission:  05/11/2012 Date of Discharge: 05/12/2012  Reason for Admission: psychosis  Discharge Diagnoses: Active Problems:  * No active hospital problems. *   Discharge Diagnoses:  AXIS I: Schizoaffective Disorder  AXIS II: Deferred  AXIS III:  Past Medical History   Diagnosis  Date   .  Urinary tract infection    .  Hypertension    .  Atrial fibrillation    .  GERD (gastroesophageal reflux disease)    .  Hypothyroidism    .  Myocardial infarction    .  Diabetes mellitus    .  Obesity    .  Asthma    .  COPD (chronic obstructive pulmonary disease)    .  Pneumonia    .  CHF (congestive heart failure)    .  Seizures    .  Morbid obesity    .  Chronic pain    AXIS IV: other psychosocial or environmental problems  AXIS V: 61-70 mild symptoms  Level of Care:  OP  Hospital Course:  Maryjo was admitted for psychosis due to recurrence of her symptoms after she ran out of her medications for 2 days prior to her admission to the ED. Her medications were restarted and her symptoms resolved.  Upon assessment by the psychiatrist at her in patient admission she reported that her symptoms had resolved and she requested to be discharged home.      As she no longer met criteria for in-patient admission she was discharged home with plans to follow up as documented with the providers listed.  Consults:  None  Significant Diagnostic Studies:  None  Discharge Vitals:   Blood pressure 112/78, pulse 94, temperature 98.3 F (36.8 C), temperature source Oral, resp. rate 22, SpO2 85.00%. Lab Results:   No results found for this or any previous visit (from the past 72 hour(s)).  Physical Findings: AIMS: Facial and Oral Movements Muscles of Facial  Expression: None, normal Lips and Perioral Area: None, normal Jaw: None, normal Tongue: None, normal,Extremity Movements Upper (arms, wrists, hands, fingers): None, normal Lower (legs, knees, ankles, toes): None, normal, Trunk Movements Neck, shoulders, hips: None, normal, Overall Severity Severity of abnormal movements (highest score from questions above): None, normal Incapacitation due to abnormal movements: None, normal Patient's awareness of abnormal movements (rate only patient's report): No Awareness, Dental Status Current problems with teeth and/or dentures?: No Does patient usually wear dentures?: No  CIWA:    COWS:     Mental Status Exam: See Mental Status Examination and Suicide Risk Assessment completed by Attending Physician prior to discharge.  Discharge destination:  Home  Is patient on multiple antipsychotic therapies at discharge:  No   Has Patient had three or more failed trials of antipsychotic monotherapy by history:  No  Recommended Plan for Multiple Antipsychotic Therapies: not applicable   Discharge Orders    Future Orders Please Complete By Expires   Diet - low sodium heart healthy      Increase activity slowly      Discharge instructions      Comments:   Take all of your medications as prescribed.  Be sure to keep ALL follow up appointments as scheduled. This is to ensure getting your refills  on time to avoid any interruption in your medication.  If you find that you can not keep your appointment, call the clinic and reschedule. Be sure to tell the nurse if you will need a refill before your appointment.       Medication List     As of 05/14/2012  3:42 PM    TAKE these medications      Indication    albuterol 108 (90 BASE) MCG/ACT inhaler   Commonly known as: PROVENTIL HFA;VENTOLIN HFA   Inhale 1 puff into the lungs every 6 (six) hours as needed. For wheezing     For asthma    aspirin EC 81 MG tablet   Take 1 tablet (81 mg total) by mouth daily.  For platelet aggregation.     Blood thinning    divalproex 500 MG DR tablet   Commonly known as: DEPAKOTE   Take 1 tablet (500 mg total) by mouth at bedtime. For bipolar mood disorder.    Indication: Depressive Phase of Manic-Depression      fluticasone 50 MCG/ACT nasal spray   Commonly known as: FLONASE   Place 2 sprays into the nose daily as needed. For nasal congestion     Seasonal allergies    folic acid 1 MG tablet   Commonly known as: FOLVITE   Take 1 tablet (1 mg total) by mouth daily. For nutritional supplement.    Indication: Deficiency of Folic Acid in the Diet      levothyroxine 150 MCG tablet   Commonly known as: SYNTHROID, LEVOTHROID   Take 1 tablet (150 mcg total) by mouth daily. For thyroid disease.    Indication: Underactive Thyroid      lisinopril 20 MG tablet   Commonly known as: PRINIVIL,ZESTRIL   Take 1 tablet (20 mg total) by mouth 2 (two) times daily. For hypertension.    Indication: High Blood Pressure      multivitamin with minerals Tabs   Take 1 tablet by mouth daily. For nutritional supplement.     Nutritional supplement    omeprazole 20 MG capsule   Commonly known as: PRILOSEC   Take 1 capsule (20 mg total) by mouth daily.    Indication: Gastroesophageal Reflux Disease with Current Symptoms      QUEtiapine 300 MG tablet   Commonly known as: SEROQUEL   Take 1 tablet (300 mg total) by mouth at bedtime. For sleep and psychosis    Indication: Depressive Phase of Manic-Depression      traMADol 50 MG tablet   Commonly known as: ULTRAM   Take 1 tablet (50 mg total) by mouth every 6 (six) hours as needed for pain. For pain      For pain.         Follow-up Information    Follow up with [facility]. [name] will come see you over the weekend, or by Monday at the latest.  She will call you before coming by)    Contact information:   [phone]         Follow-up recommendations:  As listed above.  Comments:    Signed:  Lloyd Huger T. Jenesa Foresta  PAC 05/14/2012, 3:42 PM

## 2012-05-20 ENCOUNTER — Emergency Department (HOSPITAL_COMMUNITY): Payer: Medicare Other

## 2012-05-20 ENCOUNTER — Emergency Department (HOSPITAL_COMMUNITY)
Admission: EM | Admit: 2012-05-20 | Discharge: 2012-05-21 | Disposition: A | Discharge status: Home / Self Care | Payer: Medicare Other | Attending: Emergency Medicine | Admitting: Emergency Medicine

## 2012-05-20 ENCOUNTER — Encounter (HOSPITAL_COMMUNITY): Payer: Self-pay | Admitting: Emergency Medicine

## 2012-05-20 DIAGNOSIS — I4891 Unspecified atrial fibrillation: Secondary | ICD-10-CM | POA: Insufficient documentation

## 2012-05-20 DIAGNOSIS — Z87891 Personal history of nicotine dependence: Secondary | ICD-10-CM | POA: Insufficient documentation

## 2012-05-20 DIAGNOSIS — Z7982 Long term (current) use of aspirin: Secondary | ICD-10-CM | POA: Insufficient documentation

## 2012-05-20 DIAGNOSIS — R404 Transient alteration of awareness: Secondary | ICD-10-CM | POA: Insufficient documentation

## 2012-05-20 DIAGNOSIS — J4489 Other specified chronic obstructive pulmonary disease: Secondary | ICD-10-CM | POA: Insufficient documentation

## 2012-05-20 DIAGNOSIS — W06XXXA Fall from bed, initial encounter: Secondary | ICD-10-CM | POA: Insufficient documentation

## 2012-05-20 DIAGNOSIS — S20219A Contusion of unspecified front wall of thorax, initial encounter: Secondary | ICD-10-CM | POA: Insufficient documentation

## 2012-05-20 DIAGNOSIS — Y929 Unspecified place or not applicable: Secondary | ICD-10-CM | POA: Insufficient documentation

## 2012-05-20 DIAGNOSIS — F29 Unspecified psychosis not due to a substance or known physiological condition: Secondary | ICD-10-CM | POA: Insufficient documentation

## 2012-05-20 DIAGNOSIS — S8010XA Contusion of unspecified lower leg, initial encounter: Secondary | ICD-10-CM | POA: Insufficient documentation

## 2012-05-20 DIAGNOSIS — R1110 Vomiting, unspecified: Secondary | ICD-10-CM | POA: Insufficient documentation

## 2012-05-20 DIAGNOSIS — Y939 Activity, unspecified: Secondary | ICD-10-CM | POA: Insufficient documentation

## 2012-05-20 DIAGNOSIS — Z79899 Other long term (current) drug therapy: Secondary | ICD-10-CM | POA: Insufficient documentation

## 2012-05-20 DIAGNOSIS — K219 Gastro-esophageal reflux disease without esophagitis: Secondary | ICD-10-CM | POA: Insufficient documentation

## 2012-05-20 DIAGNOSIS — S0990XA Unspecified injury of head, initial encounter: Secondary | ICD-10-CM | POA: Insufficient documentation

## 2012-05-20 DIAGNOSIS — F259 Schizoaffective disorder, unspecified: Secondary | ICD-10-CM | POA: Insufficient documentation

## 2012-05-20 DIAGNOSIS — E119 Type 2 diabetes mellitus without complications: Secondary | ICD-10-CM | POA: Insufficient documentation

## 2012-05-20 DIAGNOSIS — M542 Cervicalgia: Secondary | ICD-10-CM | POA: Insufficient documentation

## 2012-05-20 DIAGNOSIS — S8012XA Contusion of left lower leg, initial encounter: Secondary | ICD-10-CM

## 2012-05-20 DIAGNOSIS — I10 Essential (primary) hypertension: Secondary | ICD-10-CM | POA: Insufficient documentation

## 2012-05-20 DIAGNOSIS — J449 Chronic obstructive pulmonary disease, unspecified: Secondary | ICD-10-CM | POA: Insufficient documentation

## 2012-05-20 DIAGNOSIS — Z87440 Personal history of urinary (tract) infections: Secondary | ICD-10-CM | POA: Insufficient documentation

## 2012-05-20 DIAGNOSIS — Z8701 Personal history of pneumonia (recurrent): Secondary | ICD-10-CM | POA: Insufficient documentation

## 2012-05-20 DIAGNOSIS — M79609 Pain in unspecified limb: Secondary | ICD-10-CM | POA: Insufficient documentation

## 2012-05-20 DIAGNOSIS — E039 Hypothyroidism, unspecified: Secondary | ICD-10-CM | POA: Insufficient documentation

## 2012-05-20 DIAGNOSIS — I509 Heart failure, unspecified: Secondary | ICD-10-CM | POA: Insufficient documentation

## 2012-05-20 DIAGNOSIS — R197 Diarrhea, unspecified: Secondary | ICD-10-CM | POA: Insufficient documentation

## 2012-05-20 DIAGNOSIS — H538 Other visual disturbances: Secondary | ICD-10-CM | POA: Insufficient documentation

## 2012-05-20 DIAGNOSIS — I252 Old myocardial infarction: Secondary | ICD-10-CM | POA: Insufficient documentation

## 2012-05-20 DIAGNOSIS — J45909 Unspecified asthma, uncomplicated: Secondary | ICD-10-CM | POA: Insufficient documentation

## 2012-05-20 DIAGNOSIS — G8929 Other chronic pain: Secondary | ICD-10-CM | POA: Insufficient documentation

## 2012-05-20 DIAGNOSIS — W19XXXA Unspecified fall, initial encounter: Secondary | ICD-10-CM

## 2012-05-20 DIAGNOSIS — R569 Unspecified convulsions: Secondary | ICD-10-CM | POA: Insufficient documentation
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
## 2012-05-20 NOTE — ED Notes (Signed)
Per EMS, had fall earlier today and reports non-injury when EMS arrived first time; pt then called 30 minutes later--for pain and "not feeling well"; vss

## 2012-05-21 ENCOUNTER — Emergency Department (HOSPITAL_COMMUNITY): Payer: Medicare Other

## 2012-05-21 ENCOUNTER — Other Ambulatory Visit: Payer: Self-pay

## 2012-05-21 ENCOUNTER — Encounter (HOSPITAL_COMMUNITY): Payer: Self-pay | Admitting: Radiology

## 2012-05-21 MED ORDER — OXYCODONE-ACETAMINOPHEN 5-325 MG PO TABS
1.0000 | ORAL_TABLET | Freq: Once | ORAL | Status: AC
  Administered 2012-05-21: 1 via ORAL
  Filled 2012-05-21: qty 1

## 2012-05-21 MED ORDER — OXYCODONE-ACETAMINOPHEN 5-325 MG PO TABS: 1.0000 | ORAL_TABLET | ORAL | Status: DC | PRN

## 2012-05-21 NOTE — ED Provider Notes (Signed)
History     CSN: [id number]  Arrival date & time 05/20/12  2141   First MD Initiated Contact with Patient 05/20/12 2336      Chief Complaint  Patient presents with  . Fall    (Consider location/radiation/quality/duration/timing/severity/associated sxs/prior treatment) HPI History provided by pt.   Pt reports that she fell out of bed at 9:30pm last night, hit her head on the floor and her chest on a chair on the way down.  Was unconscious for 25 minutes and now w/ headache, blurred vision, vomiting and diarrhea and confusion; all of which started after fall.  Describes confusion as thinking that her fiance was talking to the police, when he was really talking to an EMT.  Has pain in neck as well as severe, diffuse pain in her chest.  Initially reports that there are no aggravating/alleviating factors but when prompted, reports that it gets worse with deep inspiration and movement. Also c/o pain and ecchymosis left shin.  Pain aggravated by bearing weight.  Has pain in RLE as well, initially attributed to last night's fall, but then reported to be chronic.  Pt is not anti-coagulated.  Per prior chart, pt has a h/o schizoaffective disorder.  She is seen in ED frequently for a variety of complaints.   Past Medical History  Diagnosis Date  . Schizoaffective disorder   . Urinary tract infection   . Hypertension   . Atrial fibrillation   . GERD (gastroesophageal reflux disease)   . Hypothyroidism   . Myocardial infarction   . Diabetes mellitus   . Obesity   . Asthma   . COPD (chronic obstructive pulmonary disease)   . Pneumonia   . CHF (congestive heart failure)   . Seizures   . Morbid obesity   . Chronic pain     Past Surgical History  Procedure Date  . Tubal ligation   . Abdominal hysterectomy     Family History  Problem Relation Age of Onset  . Heart disease Father   . Cancer Mother 64    Breast    History  Substance Use Topics  . Smoking status: Former Smoker -- 1.0  packs/day for 12 years    Quit date: 06/29/2000  . Smokeless tobacco: Current User    Types: Snuff, Chew  . Alcohol Use: No    OB History    Grav Para Term Preterm Abortions TAB SAB Ect Mult Living                  Review of Systems  All other systems reviewed and are negative.    Allergies  Sulfamethoxazole w-trimethoprim and Penicillins  Home Medications   Current Outpatient Rx  Name  Route  Sig  Dispense  Refill  . ALBUTEROL SULFATE HFA 108 (90 BASE) MCG/ACT IN AERS   Inhalation   Inhale 1 puff into the lungs every 6 (six) hours as needed. For wheezing         . ASPIRIN EC 81 MG PO TBEC   Oral   Take 1 tablet (81 mg total) by mouth daily. For platelet aggregation.         Marland Kitchen DIVALPROEX SODIUM 500 MG PO TBEC   Oral   Take 1 tablet (500 mg total) by mouth at bedtime. For bipolar mood disorder.   30 tablet   0   . FLUTICASONE PROPIONATE 50 MCG/ACT NA SUSP   Nasal   Place 2 sprays into the nose daily as needed. For nasal  congestion         . FOLIC ACID 1 MG PO TABS   Oral   Take 1 tablet (1 mg total) by mouth daily. For nutritional supplement.         Marland Kitchen LEVOTHYROXINE SODIUM 150 MCG PO TABS   Oral   Take 1 tablet (150 mcg total) by mouth daily. For thyroid disease.         Marland Kitchen LISINOPRIL 20 MG PO TABS   Oral   Take 1 tablet (20 mg total) by mouth 2 (two) times daily. For hypertension.         . ADULT MULTIVITAMIN W/MINERALS CH   Oral   Take 1 tablet by mouth daily. For nutritional supplement.         Marland Kitchen OMEPRAZOLE 20 MG PO CPDR   Oral   Take 1 capsule (20 mg total) by mouth daily.         . TRAMADOL HCL 50 MG PO TABS   Oral   Take 1 tablet (50 mg total) by mouth every 6 (six) hours as needed for pain. For pain   30 tablet   0     BP 141/93  Pulse 99  Temp 98.6 F (37 C) (Oral)  Resp 20  SpO2 91%  Physical Exam  Nursing note and vitals reviewed. Constitutional: She is oriented to person, place, and time. She appears  well-developed and well-nourished. No distress.       Morbidly obese.  Poor hygiene.   HENT:  Head: Normocephalic and atraumatic.       No scalp hematoma  Eyes:       Normal appearance  Neck: Normal range of motion.  Cardiovascular: Normal rate and regular rhythm.   Pulmonary/Chest: Effort normal and breath sounds normal. No respiratory distress.       Tenderness center of chest.  Pt begins to wail dramatically in pain when auscultating lungs, immediately after being asked if CP is aggravated by deep inspiration.    Musculoskeletal: Normal range of motion.       Pt reports mid-line cervical tenderness.  Laughs w/ palpation of thoracic/lumbar spine and reports that it tickles.  Ecchymosis left lower shin.  Trace, symmetric edema lower extremities.  Diffuse tenderness left knee, lower leg and foot.  Pain w/ ROM of ankle and knee.  2+ DP pulse and distal sensation intact bilaterally.   Neurological: She is alert and oriented to person, place, and time.  Skin: Skin is warm and dry. No rash noted.  Psychiatric: She has a normal mood and affect. Her behavior is normal.    ED Course  Procedures (including critical care time)   Date: 05/21/2012  Rate: 85   Rhythm: normal sinus rhythm  QRS Axis: normal  Intervals: normal  ST/T Wave abnormalities: normal  Conduction Disutrbances:none  Narrative Interpretation:   Old EKG Reviewed: unchanged   Labs Reviewed - No data to display Dg Chest 2 View  05/20/2012  *RADIOLOGY REPORT*  Clinical Data: Fall.  Chest injury and chest pain.  CHEST - 2 VIEW  Comparison:  05/05/2012  Findings:  The heart size and mediastinal contours are within normal limits.  Both lungs are clear.  No evidence of pneumothorax or hemothorax.  The visualized skeletal structures are unremarkable.  IMPRESSION: No active cardiopulmonary disease.   Original Report Authenticated By: Myles Rosenthal, M.D.      1. Fall   2. Contusion of left lower leg   3. Contusion of chest  MDM  61yo F w/ schizoaffective disorder, in ED often w/ a variety of complaints, presents today w/ c/o fall from bed w/ head impact, LOC and now headache, blurred vision, vomiting AND diarrhea, as well as neck pain, CP and LLE pain.  Low suspicion for TBI based on patient's history, as well as lack of scalp hematoma and focal neuro deficits.  CT head and cervical spine, as well as xrays of chest and left tib-fib are neg for acute pathology.  Screening EKG non-ischemic and no sig changes from prior.  Results discussed w/ pt.  D/c'd home w/ 10 percocet for pain.  Return precautions discussed.         Otilio Miu, PA-C 05/21/12 [phone]

## 2012-05-21 NOTE — ED Notes (Signed)
Patient returned from CT scan via stretcher.

## 2012-05-21 NOTE — ED Provider Notes (Signed)
Medical screening examination/treatment/procedure(s) were performed by non-physician practitioner and as supervising physician I was immediately available for consultation/collaboration.    Vida Roller, MD 05/21/12 [phone]

## 2012-05-21 NOTE — Discharge Summary (Signed)
Seen and agreed. Cliff Damiani, MD 

## 2012-05-23 ENCOUNTER — Emergency Department (HOSPITAL_COMMUNITY): Payer: Medicare Other

## 2012-05-23 ENCOUNTER — Emergency Department (HOSPITAL_COMMUNITY)
Admission: EM | Admit: 2012-05-23 | Discharge: 2012-05-23 | Disposition: A | Discharge status: Home / Self Care | Payer: Medicare Other | Source: Home / Self Care | Attending: Emergency Medicine | Admitting: Emergency Medicine

## 2012-05-23 ENCOUNTER — Encounter (HOSPITAL_COMMUNITY): Payer: Self-pay | Admitting: Emergency Medicine

## 2012-05-23 ENCOUNTER — Observation Stay (HOSPITAL_COMMUNITY)
Admission: EM | Admit: 2012-05-23 | Discharge: 2012-05-25 | Disposition: A | Discharge status: Home / Self Care | Payer: Medicare Other | Attending: Cardiovascular Disease | Admitting: Cardiovascular Disease

## 2012-05-23 ENCOUNTER — Encounter (HOSPITAL_COMMUNITY): Payer: Self-pay | Admitting: Radiology

## 2012-05-23 DIAGNOSIS — R0602 Shortness of breath: Secondary | ICD-10-CM | POA: Insufficient documentation

## 2012-05-23 DIAGNOSIS — R0789 Other chest pain: Secondary | ICD-10-CM

## 2012-05-23 DIAGNOSIS — E039 Hypothyroidism, unspecified: Secondary | ICD-10-CM | POA: Insufficient documentation

## 2012-05-23 DIAGNOSIS — F329 Major depressive disorder, single episode, unspecified: Secondary | ICD-10-CM | POA: Insufficient documentation

## 2012-05-23 DIAGNOSIS — J4489 Other specified chronic obstructive pulmonary disease: Secondary | ICD-10-CM | POA: Insufficient documentation

## 2012-05-23 DIAGNOSIS — E7800 Pure hypercholesterolemia, unspecified: Secondary | ICD-10-CM | POA: Insufficient documentation

## 2012-05-23 DIAGNOSIS — F259 Schizoaffective disorder, unspecified: Secondary | ICD-10-CM | POA: Insufficient documentation

## 2012-05-23 DIAGNOSIS — J449 Chronic obstructive pulmonary disease, unspecified: Secondary | ICD-10-CM | POA: Insufficient documentation

## 2012-05-23 DIAGNOSIS — E119 Type 2 diabetes mellitus without complications: Secondary | ICD-10-CM | POA: Insufficient documentation

## 2012-05-23 DIAGNOSIS — Z79899 Other long term (current) drug therapy: Secondary | ICD-10-CM | POA: Insufficient documentation

## 2012-05-23 DIAGNOSIS — I10 Essential (primary) hypertension: Secondary | ICD-10-CM | POA: Insufficient documentation

## 2012-05-23 DIAGNOSIS — F3289 Other specified depressive episodes: Secondary | ICD-10-CM | POA: Insufficient documentation

## 2012-05-23 DIAGNOSIS — R079 Chest pain, unspecified: Principal | ICD-10-CM | POA: Insufficient documentation

## 2012-05-23 DIAGNOSIS — R1110 Vomiting, unspecified: Secondary | ICD-10-CM | POA: Insufficient documentation

## 2012-05-23 DIAGNOSIS — K219 Gastro-esophageal reflux disease without esophagitis: Secondary | ICD-10-CM | POA: Insufficient documentation

## 2012-05-23 DIAGNOSIS — R55 Syncope and collapse: Secondary | ICD-10-CM | POA: Insufficient documentation

## 2012-05-23 DIAGNOSIS — I509 Heart failure, unspecified: Secondary | ICD-10-CM | POA: Insufficient documentation

## 2012-05-23 DIAGNOSIS — Z7982 Long term (current) use of aspirin: Secondary | ICD-10-CM | POA: Insufficient documentation

## 2012-05-23 DIAGNOSIS — G8929 Other chronic pain: Secondary | ICD-10-CM | POA: Insufficient documentation

## 2012-05-23 HISTORY — DX: Personal history of other medical treatment: Z92.89

## 2012-05-23 HISTORY — DX: Anemia, unspecified: D64.9

## 2012-05-23 HISTORY — DX: Personal history of other diseases of the digestive system: Z87.19

## 2012-05-23 HISTORY — DX: Other forms of dyspnea: R06.09

## 2012-05-23 HISTORY — DX: Dyspnea, unspecified: R06.00

## 2012-05-23 HISTORY — DX: Headache, unspecified: R51.9

## 2012-05-23 HISTORY — DX: Pure hypercholesterolemia, unspecified: E78.00

## 2012-05-23 HISTORY — DX: Unspecified osteoarthritis, unspecified site: M19.90

## 2012-05-23 HISTORY — DX: Headache: R51

## 2012-05-23 HISTORY — DX: Angina pectoris, unspecified: I20.9

## 2012-05-23 LAB — COMPREHENSIVE METABOLIC PANEL
ALT: 26 U/L (ref 0–35)
AST: 32 U/L (ref 0–37)
CO2: 33 mEq/L — ABNORMAL HIGH (ref 19–32)
Calcium: 9.5 mg/dL (ref 8.4–10.5)
Chloride: 101 mEq/L (ref 96–112)
Creatinine, Ser: 0.86 mg/dL (ref 0.50–1.10)
GFR calc Af Amer: 84 mL/min — ABNORMAL LOW (ref >90)
GFR calc non Af Amer: 73 mL/min — ABNORMAL LOW (ref >90)
Glucose, Bld: 123 mg/dL — ABNORMAL HIGH (ref 70–99)
Total Bilirubin: 0.3 mg/dL (ref 0.3–1.2)

## 2012-05-23 LAB — BASIC METABOLIC PANEL
Calcium: 9.4 mg/dL (ref 8.4–10.5)
Creatinine, Ser: 0.78 mg/dL (ref 0.50–1.10)
GFR calc non Af Amer: 90 mL/min — ABNORMAL LOW (ref >90)
Glucose, Bld: 121 mg/dL — ABNORMAL HIGH (ref 70–99)
Sodium: 138 mEq/L (ref 135–145)

## 2012-05-23 LAB — CBC WITH DIFFERENTIAL/PLATELET
Basophils Absolute: 0 10*3/uL (ref 0.0–0.1)
Eosinophils Absolute: 0 10*3/uL (ref 0.0–0.7)
Eosinophils Relative: 1 % (ref 0–5)
Eosinophils Relative: 1 % (ref 0–5)
HCT: 38.9 % (ref 36.0–46.0)
HCT: 38.9 % (ref 36.0–46.0)
Hemoglobin: 12.1 g/dL (ref 12.0–15.0)
Lymphocytes Relative: 53 % — ABNORMAL HIGH (ref 12–46)
Lymphs Abs: 2.5 10*3/uL (ref 0.7–4.0)
Lymphs Abs: 2.6 10*3/uL (ref 0.7–4.0)
MCH: 32.5 pg (ref 26.0–34.0)
MCV: 102.9 fL — ABNORMAL HIGH (ref 78.0–100.0)
MCV: 103.2 fL — ABNORMAL HIGH (ref 78.0–100.0)
Monocytes Absolute: 0.8 10*3/uL (ref 0.1–1.0)
Monocytes Absolute: 0.7 10*3/uL (ref 0.1–1.0)
Monocytes Relative: 14 % — ABNORMAL HIGH (ref 3–12)
Neutro Abs: 1.6 10*3/uL — ABNORMAL LOW (ref 1.7–7.7)
Platelets: 125 10*3/uL — ABNORMAL LOW (ref 150–400)
RBC: 3.78 MIL/uL — ABNORMAL LOW (ref 3.87–5.11)
RBC: 3.77 MIL/uL — ABNORMAL LOW (ref 3.87–5.11)
RDW: 14.5 % (ref 11.5–15.5)
WBC: 4.9 10*3/uL (ref 4.0–10.5)

## 2012-05-23 LAB — URINALYSIS, ROUTINE W REFLEX MICROSCOPIC
Bilirubin Urine: NEGATIVE
Glucose, UA: NEGATIVE mg/dL
Hgb urine dipstick: NEGATIVE
Ketones, ur: NEGATIVE mg/dL
Leukocytes, UA: NEGATIVE
Protein, ur: NEGATIVE mg/dL
pH: 7 (ref 5.0–8.0)

## 2012-05-23 LAB — TROPONIN I: Troponin I: <0.3 ng/mL (ref <0.30)

## 2012-05-23 LAB — APTT: aPTT: 30 seconds (ref 24–37)

## 2012-05-23 LAB — POCT I-STAT TROPONIN I: Troponin i, poc: 0 ng/mL (ref 0.00–0.08)

## 2012-05-23 MED ORDER — ONDANSETRON HCL 4 MG/2ML IJ SOLN
4.0000 mg | Freq: Once | INTRAMUSCULAR | Status: AC
  Administered 2012-05-23: 4 mg via INTRAVENOUS
  Filled 2012-05-23: qty 2

## 2012-05-23 MED ORDER — ONDANSETRON HCL 4 MG/2ML IJ SOLN: 4.0000 mg | Freq: Four times a day (QID) | INTRAMUSCULAR | Status: DC | PRN

## 2012-05-23 MED ORDER — SIMVASTATIN 20 MG PO TABS
20.0000 mg | ORAL_TABLET | Freq: Every day | ORAL | Status: DC
  Administered 2012-05-23 – 2012-05-24 (×2): 20 mg via ORAL
  Filled 2012-05-23 (×3): qty 1

## 2012-05-23 MED ORDER — ASPIRIN 81 MG PO CHEW: 324.0000 mg | CHEWABLE_TABLET | ORAL | Status: AC

## 2012-05-23 MED ORDER — ASPIRIN 300 MG RE SUPP
300.0000 mg | RECTAL | Status: AC
  Filled 2012-05-23: qty 1

## 2012-05-23 MED ORDER — HEPARIN BOLUS VIA INFUSION
4000.0000 [IU] | Freq: Once | INTRAVENOUS | Status: AC
  Administered 2012-05-23: 4000 [IU] via INTRAVENOUS

## 2012-05-23 MED ORDER — SODIUM CHLORIDE 0.9 % IV SOLN: 250.0000 mL | INTRAVENOUS | Status: DC | PRN

## 2012-05-23 MED ORDER — MORPHINE SULFATE 4 MG/ML IJ SOLN
4.0000 mg | Freq: Once | INTRAMUSCULAR | Status: AC
  Administered 2012-05-23: 4 mg via INTRAVENOUS
  Filled 2012-05-23: qty 1

## 2012-05-23 MED ORDER — SODIUM CHLORIDE 0.9 % IJ SOLN
3.0000 mL | Freq: Two times a day (BID) | INTRAMUSCULAR | Status: DC
  Administered 2012-05-24: 3 mL via INTRAVENOUS

## 2012-05-23 MED ORDER — LISINOPRIL 20 MG PO TABS
20.0000 mg | ORAL_TABLET | Freq: Two times a day (BID) | ORAL | Status: DC
  Administered 2012-05-23 – 2012-05-25 (×4): 20 mg via ORAL
  Filled 2012-05-23 (×5): qty 1

## 2012-05-23 MED ORDER — PANTOPRAZOLE SODIUM 40 MG PO TBEC
40.0000 mg | DELAYED_RELEASE_TABLET | Freq: Every day | ORAL | Status: DC
  Administered 2012-05-24 – 2012-05-25 (×2): 40 mg via ORAL
  Filled 2012-05-23 (×2): qty 1

## 2012-05-23 MED ORDER — CLOPIDOGREL BISULFATE 75 MG PO TABS
75.0000 mg | ORAL_TABLET | Freq: Every day | ORAL | Status: DC
  Administered 2012-05-24 – 2012-05-25 (×2): 75 mg via ORAL
  Filled 2012-05-23 (×2): qty 1

## 2012-05-23 MED ORDER — ASPIRIN EC 81 MG PO TBEC
81.0000 mg | DELAYED_RELEASE_TABLET | Freq: Every day | ORAL | Status: DC
  Filled 2012-05-23 (×3): qty 1

## 2012-05-23 MED ORDER — GI COCKTAIL ~~LOC~~
30.0000 mL | Freq: Once | ORAL | Status: AC
  Administered 2012-05-23: 30 mL via ORAL
  Filled 2012-05-23: qty 30

## 2012-05-23 MED ORDER — IOHEXOL 350 MG/ML SOLN
100.0000 mL | Freq: Once | INTRAVENOUS | Status: AC | PRN
  Administered 2012-05-23: 100 mL via INTRAVENOUS

## 2012-05-23 MED ORDER — LEVOTHYROXINE SODIUM 150 MCG PO TABS
150.0000 ug | ORAL_TABLET | Freq: Every day | ORAL | Status: DC
  Administered 2012-05-24 – 2012-05-25 (×2): 150 ug via ORAL
  Filled 2012-05-23 (×3): qty 1

## 2012-05-23 MED ORDER — HEPARIN (PORCINE) IN NACL 100-0.45 UNIT/ML-% IJ SOLN
1700.0000 [IU]/h | INTRAMUSCULAR | Status: DC
  Administered 2012-05-23: 1400 [IU]/h via INTRAVENOUS
  Administered 2012-05-24: 1700 [IU]/h via INTRAVENOUS
  Filled 2012-05-23 (×4): qty 250

## 2012-05-23 MED ORDER — FOLIC ACID 1 MG PO TABS
1.0000 mg | ORAL_TABLET | Freq: Every day | ORAL | Status: DC
  Administered 2012-05-23 – 2012-05-25 (×3): 1 mg via ORAL
  Filled 2012-05-23 (×3): qty 1

## 2012-05-23 MED ORDER — NITROGLYCERIN 0.4 MG SL SUBL: 0.4000 mg | SUBLINGUAL_TABLET | SUBLINGUAL | Status: DC | PRN

## 2012-05-23 MED ORDER — ACETAMINOPHEN 325 MG PO TABS: 650.0000 mg | ORAL_TABLET | ORAL | Status: DC | PRN

## 2012-05-23 MED ORDER — DIVALPROEX SODIUM 250 MG PO DR TAB
500.0000 mg | DELAYED_RELEASE_TABLET | Freq: Every day | ORAL | Status: DC
  Administered 2012-05-23 – 2012-05-24 (×2): 500 mg via ORAL
  Filled 2012-05-23 (×3): qty 2

## 2012-05-23 MED ORDER — ALBUTEROL SULFATE HFA 108 (90 BASE) MCG/ACT IN AERS
1.0000 | INHALATION_SPRAY | Freq: Four times a day (QID) | RESPIRATORY_TRACT | Status: DC | PRN
  Filled 2012-05-23: qty 6.7

## 2012-05-23 MED ORDER — SODIUM CHLORIDE 0.9 % IV SOLN
INTRAVENOUS | Status: DC
  Administered 2012-05-23 – 2012-05-24 (×2): via INTRAVENOUS

## 2012-05-23 MED ORDER — NITROGLYCERIN 0.4 MG SL SUBL: 0.4000 mg | SUBLINGUAL_TABLET | Freq: Once | SUBLINGUAL | Status: DC

## 2012-05-23 MED ORDER — ASPIRIN EC 81 MG PO TBEC
81.0000 mg | DELAYED_RELEASE_TABLET | Freq: Every day | ORAL | Status: DC
  Administered 2012-05-24 – 2012-05-25 (×2): 81 mg via ORAL
  Filled 2012-05-23 (×2): qty 1

## 2012-05-23 MED ORDER — SODIUM CHLORIDE 0.9 % IJ SOLN: 3.0000 mL | INTRAMUSCULAR | Status: DC | PRN

## 2012-05-23 MED ORDER — METOPROLOL TARTRATE 12.5 MG HALF TABLET
12.5000 mg | ORAL_TABLET | Freq: Two times a day (BID) | ORAL | Status: DC
  Administered 2012-05-23 – 2012-05-25 (×4): 12.5 mg via ORAL
  Filled 2012-05-23 (×5): qty 1

## 2012-05-23 MED ORDER — ALPRAZOLAM 0.25 MG PO TABS: 0.2500 mg | ORAL_TABLET | Freq: Two times a day (BID) | ORAL | Status: DC | PRN

## 2012-05-23 MED ORDER — ADULT MULTIVITAMIN W/MINERALS CH
1.0000 | ORAL_TABLET | Freq: Every day | ORAL | Status: DC
  Administered 2012-05-23 – 2012-05-25 (×3): 1 via ORAL
  Filled 2012-05-23 (×3): qty 1

## 2012-05-23 MED ORDER — FLUTICASONE PROPIONATE 50 MCG/ACT NA SUSP
2.0000 | Freq: Every day | NASAL | Status: DC | PRN
  Filled 2012-05-23 (×2): qty 16

## 2012-05-23 MED ORDER — OXYCODONE-ACETAMINOPHEN 5-325 MG PO TABS: 1.0000 | ORAL_TABLET | ORAL | Status: DC | PRN

## 2012-05-23 MED ORDER — TRAMADOL HCL 50 MG PO TABS: 50.0000 mg | ORAL_TABLET | Freq: Four times a day (QID) | ORAL | Status: DC | PRN

## 2012-05-23 NOTE — ED Provider Notes (Signed)
History     CSN: [id number]  Arrival date & time 05/23/12  1401   First MD Initiated Contact with Patient 05/23/12 1423      Chief Complaint  Patient presents with  . Near Syncope  . Chest Pain    (Consider location/radiation/quality/duration/timing/severity/associated sxs/prior treatment) The history is provided by the patient.   patient here after having 3 syncopal episodes associated with chest pain blood home. Was discharged from the hospital earlier this morning after and stay in the emergency department with a negative workup including a negative chest CT. She has multiple admissions for multiple different medical ailments. EMS was called and she was given nitroglycerin and aspirin by EMS. With some relief of her chest pain. She is unsure what made her syncope or chest pain worse. She also notes epigastric pain without radiation and she states that she did vomit 3 times.  Past Medical History  Diagnosis Date  . Schizoaffective disorder   . Urinary tract infection   . Hypertension   . Atrial fibrillation   . GERD (gastroesophageal reflux disease)   . Hypothyroidism   . Myocardial infarction   . Diabetes mellitus   . Obesity   . Asthma   . COPD (chronic obstructive pulmonary disease)   . Pneumonia   . CHF (congestive heart failure)   . Seizures   . Morbid obesity   . Chronic pain     Past Surgical History  Procedure Date  . Tubal ligation   . Abdominal hysterectomy     Family History  Problem Relation Age of Onset  . Heart disease Father   . Cancer Mother 60    Breast    History  Substance Use Topics  . Smoking status: Former Smoker -- 1.0 packs/day for 12 years    Quit date: 06/29/2000  . Smokeless tobacco: Current User    Types: Snuff, Chew  . Alcohol Use: No    OB History    Grav Para Term Preterm Abortions TAB SAB Ect Mult Living                  Review of Systems  All other systems reviewed and are negative.    Allergies   Sulfamethoxazole w-trimethoprim and Penicillins  Home Medications   Current Outpatient Rx  Name  Route  Sig  Dispense  Refill  . ALBUTEROL SULFATE HFA 108 (90 BASE) MCG/ACT IN AERS   Inhalation   Inhale 1 puff into the lungs every 6 (six) hours as needed. For wheezing         . ASPIRIN 325 MG PO TABS   Oral   Take 325 mg by mouth once.         . ASPIRIN EC 81 MG PO TBEC   Oral   Take 1 tablet (81 mg total) by mouth daily. For platelet aggregation.         Marland Kitchen DIVALPROEX SODIUM 500 MG PO TBEC   Oral   Take 1 tablet (500 mg total) by mouth at bedtime. For bipolar mood disorder.   30 tablet   0   . FLUTICASONE PROPIONATE 50 MCG/ACT NA SUSP   Nasal   Place 2 sprays into the nose daily as needed. For nasal congestion         . FOLIC ACID 1 MG PO TABS   Oral   Take 1 tablet (1 mg total) by mouth daily. For nutritional supplement.         Marland Kitchen LEVOTHYROXINE SODIUM  150 MCG PO TABS   Oral   Take 1 tablet (150 mcg total) by mouth daily. For thyroid disease.         Marland Kitchen LISINOPRIL 20 MG PO TABS   Oral   Take 1 tablet (20 mg total) by mouth 2 (two) times daily. For hypertension.         . ADULT MULTIVITAMIN W/MINERALS CH   Oral   Take 1 tablet by mouth daily. For nutritional supplement.         Marland Kitchen NITROGLYCERIN 0.4 MG SL SUBL   Sublingual   Place 0.4 mg under the tongue once.         . OMEPRAZOLE 20 MG PO CPDR   Oral   Take 1 capsule (20 mg total) by mouth daily.         . OXYCODONE-ACETAMINOPHEN 5-325 MG PO TABS   Oral   Take 1 tablet by mouth every 4 (four) hours as needed for pain.   10 tablet   0   . TRAMADOL HCL 50 MG PO TABS   Oral   Take 1 tablet (50 mg total) by mouth every 6 (six) hours as needed for pain. For pain   30 tablet   0     BP 124/82  Pulse 100  Temp 98.7 F (37.1 C) (Oral)  Resp 21  SpO2 86%  Physical Exam  Nursing note and vitals reviewed. Constitutional: She is oriented to person, place, and time. She appears  well-developed and well-nourished.  Non-toxic appearance. No distress.  HENT:  Head: Normocephalic and atraumatic.  Eyes: Conjunctivae normal, EOM and lids are normal. Pupils are equal, round, and reactive to light.  Neck: Normal range of motion. Neck supple. No tracheal deviation present. No mass present.  Cardiovascular: Normal rate, regular rhythm and normal heart sounds.  Exam reveals no gallop.   No murmur heard. Pulmonary/Chest: Effort normal and breath sounds normal. No stridor. No respiratory distress. She has no decreased breath sounds. She has no wheezes. She has no rhonchi. She has no rales.  Abdominal: Soft. Normal appearance and bowel sounds are normal. She exhibits no distension. There is no tenderness. There is no rebound and no CVA tenderness.  Musculoskeletal: Normal range of motion. She exhibits no edema and no tenderness.  Neurological: She is alert and oriented to person, place, and time. She has normal strength. No cranial nerve deficit or sensory deficit. GCS eye subscore is 4. GCS verbal subscore is 5. GCS motor subscore is 6.  Skin: Skin is warm and dry. No abrasion and no rash noted.  Psychiatric: She has a normal mood and affect. Her speech is normal and behavior is normal.    ED Course  Procedures (including critical care time)   Labs Reviewed  CBC WITH DIFFERENTIAL  COMPREHENSIVE METABOLIC PANEL   Ct Angio Chest Pe W/cm &/or Wo Cm  05/23/2012  *RADIOLOGY REPORT*  Clinical Data: Shortness of breath.  Mid chest pain.  CT ANGIOGRAPHY CHEST  Technique:  Multidetector CT imaging of the chest using the standard protocol during bolus administration of intravenous contrast. Multiplanar reconstructed images including MIPs were obtained and reviewed to evaluate the vascular anatomy.  Contrast: OMNIPAQUE IOHEXOL 350 MG/ML SOLN  Comparison: Chest radiograph performed earlier today at 04:41 a.m., and CTA of the chest performed 03/28/2012  Findings: There is no evidence of  significant pulmonary embolus; evaluation for pulmonary embolus is mildly suboptimal due to motion artifact.  Minimal bilateral dependent subsegmental atelectasis is noted.  There is no evidence of significant focal consolidation, pleural effusion or pneumothorax.  No masses are identified; no abnormal focal contrast enhancement is seen.  The mediastinum is borderline normal in size.  No mediastinal lymphadenopathy is seen.  No pericardial effusion is identified. The great vessels are grossly unremarkable in appearance.  No axillary lymphadenopathy is seen.  The visualized portions of the thyroid gland are unremarkable in appearance.  The visualized portions of the liver and spleen are unremarkable.  No acute osseous abnormalities are seen.  IMPRESSION:  1.  No evidence of significant pulmonary embolus. 2.  Minimal bilateral dependent subsegmental atelectasis noted; lungs otherwise clear.   Original Report Authenticated By: Tonia Ghent, M.D.    Dg Chest Portable 1 View  05/23/2012  *RADIOLOGY REPORT*  Clinical Data: Mid chest pain.  PORTABLE CHEST - 1 VIEW  Comparison: Chest radiograph performed 05/20/2012  Findings: The lungs are mildly hypoexpanded.  Vascular congestion and vascular crowding are seen.  Minimal bibasilar atelectasis is suggested.  No pleural effusion or pneumothorax identified.  The cardiomediastinal silhouette is borderline normal in size.  No acute osseous abnormalities are seen.  IMPRESSION: Lungs mildly hypoexpanded, with suggestion of minimal bibasilar atelectasis.  Mild vascular congestion noted.   Original Report Authenticated By: Tonia Ghent, M.D.      No diagnosis found.    MDM   Date: 05/23/2012  Rate: 90  Rhythm: normal sinus rhythm  QRS Axis: normal  Intervals: normal  ST/T Wave abnormalities: nonspecific ST changes  Conduction Disutrbances:none  Narrative Interpretation:   Old EKG Reviewed: unchanged    2:58 PM Dr. Algie Coffer to come see the pt and  admit  Toy Baker, MD 05/23/12 1459

## 2012-05-23 NOTE — ED Notes (Signed)
Pt here from home via EMS c/o syncope and chest pain center chest radiate to left chest. Pt was discharged to home from here this morning. Nitro x1 and ASA x4 given by EMS. Vitals per EMS BP 140/90, HR 90-96, O2 97% on room air.

## 2012-05-23 NOTE — H&P (Signed)
[name] is an 61 y.o. female.   Chief Complaint: Chest pain HPI: Recurrent chest pain and syncopal episodes. Also has anxiety over her care in general. Negative work up and CT chest this AM in ER.  Past Medical History  Diagnosis Date  . Schizoaffective disorder   . Urinary tract infection   . Hypertension   . Atrial fibrillation   . GERD (gastroesophageal reflux disease)   . Hypothyroidism   . Myocardial infarction   . Diabetes mellitus   . Obesity   . Asthma   . COPD (chronic obstructive pulmonary disease)   . Pneumonia   . CHF (congestive heart failure)   . Seizures   . Morbid obesity   . Chronic pain       Past Surgical History  Procedure Date  . Tubal ligation   . Abdominal hysterectomy     Family History  Problem Relation Age of Onset  . Heart disease Father   . Cancer Mother 15    Breast   Social History:  reports that she quit smoking about 11 years ago. Her smokeless tobacco use includes Snuff and Chew. She reports that she does not drink alcohol or use illicit drugs.  Allergies:  Allergies  Allergen Reactions  . Food     "lasagna"= rash from the noodles  . Sulfamethoxazole W-Trimethoprim Hives and Nausea And Vomiting  . Penicillins Hives    Medications Prior to Admission  Medication Sig Dispense Refill  . albuterol (PROVENTIL HFA;VENTOLIN HFA) 108 (90 BASE) MCG/ACT inhaler Inhale 1 puff into the lungs every 6 (six) hours as needed. For wheezing      . aspirin EC 81 MG tablet Take 1 tablet (81 mg total) by mouth daily. For platelet aggregation.      . divalproex (DEPAKOTE) 500 MG DR tablet Take 1 tablet (500 mg total) by mouth at bedtime. For bipolar mood disorder.  30 tablet  0  . fluticasone (FLONASE) 50 MCG/ACT nasal spray Place 2 sprays into the nose daily as needed. For nasal congestion      . folic acid (FOLVITE) 1 MG tablet Take 1 tablet (1 mg total) by mouth daily. For nutritional supplement.      Marland Kitchen levothyroxine (SYNTHROID, LEVOTHROID)  150 MCG tablet Take 1 tablet (150 mcg total) by mouth daily. For thyroid disease.      Marland Kitchen lisinopril (PRINIVIL,ZESTRIL) 20 MG tablet Take 1 tablet (20 mg total) by mouth 2 (two) times daily. For hypertension.      . Multiple Vitamin (MULTIVITAMIN WITH MINERALS) TABS Take 1 tablet by mouth daily. For nutritional supplement.      . nitroGLYCERIN (NITROSTAT) 0.4 MG SL tablet Place 0.4 mg under the tongue once.      Marland Kitchen omeprazole (PRILOSEC) 20 MG capsule Take 1 capsule (20 mg total) by mouth daily.      Marland Kitchen oxyCODONE-acetaminophen (ROXICET) 5-325 MG per tablet Take 1 tablet by mouth every 4 (four) hours as needed for pain.  10 tablet  0  . traMADol (ULTRAM) 50 MG tablet Take 1 tablet (50 mg total) by mouth every 6 (six) hours as needed for pain. For pain  30 tablet  0    Results for orders placed during the hospital encounter of 05/23/12 (from the past 48 hour(s))  CBC WITH DIFFERENTIAL     Status: Abnormal   Collection Time   05/23/12  2:28 PM      Component Value Range Comment   WBC 4.9  4.0 -  10.5 K/uL    RBC 3.77 (*) 3.87 - 5.11 MIL/uL    Hemoglobin 12.1  12.0 - 15.0 g/dL    HCT 16.1  09.6 - 04.5 %    MCV 103.2 (*) 78.0 - 100.0 fL    MCH 32.1  26.0 - 34.0 pg    MCHC 31.1  30.0 - 36.0 g/dL    RDW 40.9  81.1 - 91.4 %    Platelets 122 (*) 150 - 400 K/uL    Neutrophils Relative 33 (*) 43 - 77 %    Neutro Abs 1.6 (*) 1.7 - 7.7 K/uL    Lymphocytes Relative 53 (*) 12 - 46 %    Lymphs Abs 2.6  0.7 - 4.0 K/uL    Monocytes Relative 14 (*) 3 - 12 %    Monocytes Absolute 0.7  0.1 - 1.0 K/uL    Eosinophils Relative 1  0 - 5 %    Eosinophils Absolute 0.0  0.0 - 0.7 K/uL    Basophils Relative 0  0 - 1 %    Basophils Absolute 0.0  0.0 - 0.1 K/uL   COMPREHENSIVE METABOLIC PANEL     Status: Abnormal   Collection Time   05/23/12  2:28 PM      Component Value Range Comment   Sodium 140  135 - 145 mEq/L    Potassium 3.1 (*) 3.5 - 5.1 mEq/L    Chloride 101  96 - 112 mEq/L    CO2 33 (*) 19 - 32 mEq/L     Glucose, Bld 123 (*) 70 - 99 mg/dL    BUN 6  6 - 23 mg/dL    Creatinine, Ser 7.82  0.50 - 1.10 mg/dL    Calcium 9.5  8.4 - 95.6 mg/dL    Total Protein 6.1  6.0 - 8.3 g/dL    Albumin 3.0 (*) 3.5 - 5.2 g/dL    AST 32  0 - 37 U/L    ALT 26  0 - 35 U/L    Alkaline Phosphatase 53  39 - 117 U/L    Total Bilirubin 0.3  0.3 - 1.2 mg/dL    GFR calc non Af Amer 73 (*) >90 mL/min    GFR calc Af Amer 84 (*) >90 mL/min   LIPASE, BLOOD     Status: Normal   Collection Time   05/23/12  2:28 PM      Component Value Range Comment   Lipase 25  11 - 59 U/L   POCT I-STAT TROPONIN I     Status: Normal   Collection Time   05/23/12  2:43 PM      Component Value Range Comment   Troponin i, poc 0.00  0.00 - 0.08 ng/mL    Comment 3            PROTIME-INR     Status: Normal   Collection Time   05/23/12  3:56 PM      Component Value Range Comment   Prothrombin Time 12.9  11.6 - 15.2 seconds    INR 0.98  0.00 - 1.49   APTT     Status: Normal   Collection Time   05/23/12  3:56 PM      Component Value Range Comment   aPTT 30  24 - 37 seconds   TROPONIN I     Status: Normal   Collection Time   05/23/12  6:00 PM      Component Value Range Comment   Troponin  I <0.30  <0.30 ng/mL    Dg Chest 2 View  05/23/2012  *RADIOLOGY REPORT*  Clinical Data: The chest pain, shortness of breath.  CHEST - 2 VIEW  Comparison: 05/23/2012.  Findings: The lungs are better expanded and the interstitium has cleared.  There are no focal pulmonary abnormalities.  The heart and mediastinal structures are normal.  No effusions or pneumothoraces.  No acute bony changes.  IMPRESSION: No active disease.   Original Report Authenticated By: Sander Radon, M.D.    Ct Angio Chest Pe W/cm &/or Wo Cm  05/23/2012  *RADIOLOGY REPORT*  Clinical Data: Shortness of breath.  Mid chest pain.  CT ANGIOGRAPHY CHEST  Technique:  Multidetector CT imaging of the chest using the standard protocol during bolus administration of intravenous contrast.  Multiplanar reconstructed images including MIPs were obtained and reviewed to evaluate the vascular anatomy.  Contrast: OMNIPAQUE IOHEXOL 350 MG/ML SOLN  Comparison: Chest radiograph performed earlier today at 04:41 a.m., and CTA of the chest performed 03/28/2012  Findings: There is no evidence of significant pulmonary embolus; evaluation for pulmonary embolus is mildly suboptimal due to motion artifact.  Minimal bilateral dependent subsegmental atelectasis is noted. There is no evidence of significant focal consolidation, pleural effusion or pneumothorax.  No masses are identified; no abnormal focal contrast enhancement is seen.  The mediastinum is borderline normal in size.  No mediastinal lymphadenopathy is seen.  No pericardial effusion is identified. The great vessels are grossly unremarkable in appearance.  No axillary lymphadenopathy is seen.  The visualized portions of the thyroid gland are unremarkable in appearance.  The visualized portions of the liver and spleen are unremarkable.  No acute osseous abnormalities are seen.  IMPRESSION:  1.  No evidence of significant pulmonary embolus. 2.  Minimal bilateral dependent subsegmental atelectasis noted; lungs otherwise clear.   Original Report Authenticated By: Tonia Ghent, M.D.    Dg Chest Portable 1 View  05/23/2012  *RADIOLOGY REPORT*  Clinical Data: Mid chest pain.  PORTABLE CHEST - 1 VIEW  Comparison: Chest radiograph performed 05/20/2012  Findings: The lungs are mildly hypoexpanded.  Vascular congestion and vascular crowding are seen.  Minimal bibasilar atelectasis is suggested.  No pleural effusion or pneumothorax identified.  The cardiomediastinal silhouette is borderline normal in size.  No acute osseous abnormalities are seen.  IMPRESSION: Lungs mildly hypoexpanded, with suggestion of minimal bibasilar atelectasis.  Mild vascular congestion noted.   Original Report Authenticated By: Tonia Ghent, M.D.    Review of Systems   Constitutional: Negative for fever, chills and weight loss.  HENT: Negative for hearing loss.  Eyes: Negative for blurred vision.  Respiratory: Negative for cough, hemoptysis and sputum production.  Cardiovascular: Positive for chest pain. Negative for palpitations, orthopnea, claudication and leg swelling.  Gastrointestinal: Positive for nausea and vomiting. Negative for abdominal pain.  Neurological: Positive for dizziness and headaches.    Blood pressure 119/81, pulse 72, temperature 97.6 F (36.4 C), temperature source Oral, resp. rate 18, height 5\' 2"  (1.575 m), weight 154.45 kg (340 lb 8 oz), SpO2 96.00%.  Constitutional: She is oriented to person, place, and time. She appears well-developed and well-nourished. Non-toxic appearance. No distress.  HENT: Normocephalic and atraumatic. Hazel eyes: Conjunctivae normal, EOM and lids are normal. Pupils are equal, round, and reactive to light.  Neck: Normal range of motion. Neck supple. No tracheal deviation present. No mass present.  Cardiovascular: Normal rate, regular rhythm and normal heart sounds. II/VI systolic murmur. Pulmonary/Chest: Effort normal and breath sounds normal.  No stridor. No respiratory distress. She has no decreased breath sounds. She has no wheezes. She has no rhonchi. She has no rales.  Abdominal: Soft. Normal appearance and bowel sounds are normal. + distension. .  Musculoskeletal: Normal range of motion. She exhibits no edema and no tenderness.  Neurological: She is alert and oriented to person, place, and time. She has normal strength. No cranial nerve deficit or sensory deficit.  Skin: Skin is warm and dry. No abrasion and no rash noted.  Psychiatric: Anxiety and chronic tremors.  Assessment/Plan 1. Recurrent chest pain rule out myocardial infarction.  2. Hypertension.  3. Hypercholesteremia.  4. History of atrial fibrillation and flutter in the past.  5. Hypothyroidism.  6. Morbid obesity.  7. Depression.   8. History of schizoaffective disorder.  9. Chronic obstructive pulmonary disease.  10.Gastroesophageal reflux disease.  Nuclear stress test in AM.   Thula Stewart S 05/23/2012, 7:03 PM

## 2012-05-23 NOTE — ED Notes (Signed)
The patient called EMS complaining of shortness of breath.  Upon arrival, EMS noted the patient was in no acute distress, her lungs were clear, and her oxygen saturation was normal.  When they advised the patient of their findings, the patient stated that she was no longer short of breath, but she now complained of non-radiating, 10 out of 10, chest pain in the center of her chest.  EMS gave ASA 324 mg, po and nitro-stat 0.4 mg, sL x 1, but the patient stated that she got no relief.

## 2012-05-23 NOTE — Progress Notes (Signed)
ANTICOAGULATION CONSULT NOTE - Initial Consult  Pharmacy Consult for Heparin Indication: chest pain/ACS  Allergies  Allergen Reactions  . Food     "lasagna"= rash from the noodles  . Sulfamethoxazole W-Trimethoprim Hives and Nausea And Vomiting  . Penicillins Hives    Patient Measurements:  Weight: 177kg (per patient) Heparin Dosing Weight: 97kg  Vital Signs: Temp: 98.7 F (37.1 C) (12/04 1418) Temp src: Oral (12/04 1418) BP: 121/79 mmHg (12/04 1500) Pulse Rate: 94  (12/04 1500)  Labs:  Basename 05/23/12 1428 05/23/12 0646 05/23/12 0429  HGB 12.1 -- 12.3  HCT 38.9 -- 38.9  PLT 122* -- 125*  APTT -- -- --  LABPROT -- -- --  INR -- -- --  HEPARINUNFRC -- -- --  CREATININE 0.86 -- 0.78  CKTOTAL -- -- --  CKMB -- -- --  TROPONINI -- <0.30 <0.30    The CrCl is unknown because both a height and weight (above a minimum accepted value) are required for this calculation.   Medical History: Past Medical History  Diagnosis Date  . Schizoaffective disorder   . Urinary tract infection   . Hypertension   . Atrial fibrillation   . GERD (gastroesophageal reflux disease)   . Hypothyroidism   . Myocardial infarction   . Diabetes mellitus   . Obesity   . Asthma   . COPD (chronic obstructive pulmonary disease)   . Pneumonia   . CHF (congestive heart failure)   . Seizures   . Morbid obesity   . Chronic pain     Medications:  See electronic med rec  Assessment: 61yof to start heparin for CP/ACS. Patient had similar presentation 7/13 and was treated with heparin. Patient was therapeutic with heparin 1400-1500 units/hr. - H/H wnl, Plts low - Patient reports no bleeding - Baseline INR: 0.98 - CT Angio: negative for PE - Heparin weight: 97kg  Goal of Therapy:  Heparin level 0.3-0.7 units/ml Monitor platelets by anticoagulation protocol: Yes   Plan:  1. Heparin IV 4000 unit bolus x 1 2. Heparin drip 1400 units/hr (14 ml/hr) 3. Check heparin level 6 hours after  heparin initiation 4. Daily heparin level and CBC  Cleon Dew [phone] 05/23/2012,4:25 PM

## 2012-05-23 NOTE — ED Provider Notes (Signed)
History     CSN: [id number]  Arrival date & time 05/23/12  0417   First MD Initiated Contact with Patient 05/23/12 0441      Chief Complaint  Patient presents with  . Chest Pain    (Consider location/radiation/quality/duration/timing/severity/associated sxs/prior treatment) HPI Comments: Per report patient called EMS for shortness of breath and found her to be in no distress. Patient then complained of chest pain to EMS that is sharp and stabbing nonradiating in the center of her chest. She was given aspirin nitroglycerin and transported to the ED. On my assessment patient states that she's been vomiting all night and unable to keep anything down. She is unable to [facility] time she vomited. She denies any abdominal pain or diarrhea. She denies any fever, chills, dysuria or hematuria. She is well-known to the ED for her frequent visits for chest pain.  The history is provided by the patient and the EMS personnel.    Past Medical History  Diagnosis Date  . Schizoaffective disorder   . Urinary tract infection   . Hypertension   . Atrial fibrillation   . GERD (gastroesophageal reflux disease)   . Hypothyroidism   . Myocardial infarction   . Diabetes mellitus   . Obesity   . Asthma   . COPD (chronic obstructive pulmonary disease)   . Pneumonia   . CHF (congestive heart failure)   . Seizures   . Morbid obesity   . Chronic pain     Past Surgical History  Procedure Date  . Tubal ligation   . Abdominal hysterectomy     Family History  Problem Relation Age of Onset  . Heart disease Father   . Cancer Mother 51    Breast    History  Substance Use Topics  . Smoking status: Former Smoker -- 1.0 packs/day for 12 years    Quit date: 06/29/2000  . Smokeless tobacco: Current User    Types: Snuff, Chew  . Alcohol Use: No    OB History    Grav Para Term Preterm Abortions TAB SAB Ect Mult Living                  Review of Systems  Constitutional: Negative for fever.   HENT: Negative for congestion and rhinorrhea.   Respiratory: Negative for shortness of breath.   Cardiovascular: Positive for chest pain.  Gastrointestinal: Positive for nausea and vomiting. Negative for abdominal pain.  Genitourinary: Negative for dysuria.  Musculoskeletal: Negative for back pain.  Neurological: Negative for dizziness, weakness and headaches.    Allergies  Sulfamethoxazole w-trimethoprim and Penicillins  Home Medications   Current Outpatient Rx  Name  Route  Sig  Dispense  Refill  . ALBUTEROL SULFATE HFA 108 (90 BASE) MCG/ACT IN AERS   Inhalation   Inhale 1 puff into the lungs every 6 (six) hours as needed. For wheezing         . ASPIRIN EC 81 MG PO TBEC   Oral   Take 1 tablet (81 mg total) by mouth daily. For platelet aggregation.         Marland Kitchen DIVALPROEX SODIUM 500 MG PO TBEC   Oral   Take 1 tablet (500 mg total) by mouth at bedtime. For bipolar mood disorder.   30 tablet   0   . FLUTICASONE PROPIONATE 50 MCG/ACT NA SUSP   Nasal   Place 2 sprays into the nose daily as needed. For nasal congestion         .  FOLIC ACID 1 MG PO TABS   Oral   Take 1 tablet (1 mg total) by mouth daily. For nutritional supplement.         Marland Kitchen LEVOTHYROXINE SODIUM 150 MCG PO TABS   Oral   Take 1 tablet (150 mcg total) by mouth daily. For thyroid disease.         Marland Kitchen LISINOPRIL 20 MG PO TABS   Oral   Take 1 tablet (20 mg total) by mouth 2 (two) times daily. For hypertension.         . ADULT MULTIVITAMIN W/MINERALS CH   Oral   Take 1 tablet by mouth daily. For nutritional supplement.         Marland Kitchen OMEPRAZOLE 20 MG PO CPDR   Oral   Take 1 capsule (20 mg total) by mouth daily.         . OXYCODONE-ACETAMINOPHEN 5-325 MG PO TABS   Oral   Take 1 tablet by mouth every 4 (four) hours as needed for pain.   10 tablet   0   . TRAMADOL HCL 50 MG PO TABS   Oral   Take 1 tablet (50 mg total) by mouth every 6 (six) hours as needed for pain. For pain   30 tablet   0    . ASPIRIN 325 MG PO TABS   Oral   Take 325 mg by mouth once.         Marland Kitchen NITROGLYCERIN 0.4 MG SL SUBL   Sublingual   Place 0.4 mg under the tongue once.           BP 120/59  Pulse 85  Temp 97.7 F (36.5 C) (Oral)  Resp 18  SpO2 98%  Physical Exam  Constitutional: She is oriented to person, place, and time. She appears well-developed and well-nourished. No distress.  HENT:  Head: Normocephalic and atraumatic.  Mouth/Throat: Oropharynx is clear and moist. No oropharyngeal exudate.  Eyes: Conjunctivae normal are normal. Pupils are equal, round, and reactive to light.  Neck: Normal range of motion. Neck supple.  Cardiovascular: Normal rate, regular rhythm and normal heart sounds.   No murmur heard. Pulmonary/Chest: Effort normal and breath sounds normal. She exhibits tenderness.  Abdominal: Soft. There is no tenderness. There is no rebound and no guarding.  Musculoskeletal: Normal range of motion. She exhibits edema.       +2 edema bilaterally to mid tibia.  Neurological: She is alert and oriented to person, place, and time. No cranial nerve deficit. She exhibits normal muscle tone. Coordination normal.  Skin: Skin is warm.    ED Course  Procedures (including critical care time)  Labs Reviewed  CBC WITH DIFFERENTIAL - Abnormal; Notable for the following:    RBC 3.78 (*)     MCV 102.9 (*)     Platelets 125 (*)     Neutrophils Relative 41 (*)     Monocytes Relative 14 (*)     All other components within normal limits  BASIC METABOLIC PANEL - Abnormal; Notable for the following:    Potassium 3.4 (*)     Glucose, Bld 121 (*)     GFR calc non Af Amer 90 (*)     All other components within normal limits  D-DIMER, QUANTITATIVE - Abnormal; Notable for the following:    D-Dimer, Quant 1.06 (*)     All other components within normal limits  TROPONIN I  PRO B NATRIURETIC PEPTIDE  URINALYSIS, ROUTINE W REFLEX MICROSCOPIC  TROPONIN I  Dg Chest Portable 1  View  05/23/2012  *RADIOLOGY REPORT*  Clinical Data: Mid chest pain.  PORTABLE CHEST - 1 VIEW  Comparison: Chest radiograph performed 05/20/2012  Findings: The lungs are mildly hypoexpanded.  Vascular congestion and vascular crowding are seen.  Minimal bibasilar atelectasis is suggested.  No pleural effusion or pneumothorax identified.  The cardiomediastinal silhouette is borderline normal in size.  No acute osseous abnormalities are seen.  IMPRESSION: Lungs mildly hypoexpanded, with suggestion of minimal bibasilar atelectasis.  Mild vascular congestion noted.   Original Report Authenticated By: Tonia Ghent, M.D.      No diagnosis found.    MDM  Substernal chest pain is reproducible to palpation. EKG unchanged. Patient no distress with normal oxygen saturation on her normal 2 L of oxygen. Lungs are clear. Low suspicion for ACS or PE.  CXR negative. EKG unchanged. Troponin negative.  Clean cath 2007 with recent negative stress test in July 2013.  Delta troponin negative. CT negative for pulmonary embolism. Discussed with Dr. Algie Coffer for Dr. Sharyn Lull. Agrees patient stable for discharge. He would like to see her in the office tomorrow.   Date: 05/23/2012  Rate: 98  Rhythm: normal sinus rhythm  QRS Axis: normal  Intervals: normal  ST/T Wave abnormalities: normal  Conduction Disutrbances:none  Narrative Interpretation:   Old EKG Reviewed: unchanged    Glynn Octave, MD 05/23/12 [phone]

## 2012-05-24 ENCOUNTER — Encounter (HOSPITAL_COMMUNITY): Payer: Medicare Other

## 2012-05-24 LAB — BASIC METABOLIC PANEL
BUN: 6 mg/dL (ref 6–23)
CO2: 31 mEq/L (ref 19–32)
Chloride: 103 mEq/L (ref 96–112)
Creatinine, Ser: 0.83 mg/dL (ref 0.50–1.10)
GFR calc Af Amer: 88 mL/min — ABNORMAL LOW (ref >90)
Glucose, Bld: 86 mg/dL (ref 70–99)
Potassium: 4 mEq/L (ref 3.5–5.1)

## 2012-05-24 LAB — CBC
HCT: 38.9 % (ref 36.0–46.0)
Hemoglobin: 11.9 g/dL — ABNORMAL LOW (ref 12.0–15.0)
MCH: 32.2 pg (ref 26.0–34.0)
MCHC: 30.6 g/dL (ref 30.0–36.0)
MCV: 105.1 fL — ABNORMAL HIGH (ref 78.0–100.0)
RDW: 14.9 % (ref 11.5–15.5)

## 2012-05-24 LAB — LIPID PANEL
Cholesterol: 148 mg/dL (ref 0–200)
LDL Cholesterol: 81 mg/dL (ref 0–99)
Total CHOL/HDL Ratio: 2.8 RATIO
Triglycerides: 72 mg/dL (ref <150)

## 2012-05-24 LAB — PROTIME-INR: Prothrombin Time: 13 seconds (ref 11.6–15.2)

## 2012-05-24 MED ORDER — HEPARIN BOLUS VIA INFUSION
2000.0000 [IU] | Freq: Once | INTRAVENOUS | Status: AC
  Administered 2012-05-24: 2000 [IU] via INTRAVENOUS
  Filled 2012-05-24: qty 2000

## 2012-05-24 MED ORDER — POTASSIUM CHLORIDE CRYS ER 20 MEQ PO TBCR
40.0000 meq | EXTENDED_RELEASE_TABLET | Freq: Two times a day (BID) | ORAL | Status: AC
  Administered 2012-05-24 – 2012-05-25 (×2): 40 meq via ORAL
  Filled 2012-05-24 (×2): qty 2

## 2012-05-24 NOTE — Progress Notes (Signed)
Chaplain visited patient after receiving a printed consult. Patient was half awake and appeared to be tired and sleepy. Patient expressed her concern for fiancee's social life and habit. Patient requested Chaplain to pray for him. Chaplain provided ministry presence and empathic listening. Chaplain prayed for patient and fiancee. Chaplain will provided spiritual care as needed. Patient thanked Chaplain for the support and visit.

## 2012-05-24 NOTE — Progress Notes (Signed)
   CARE MANAGEMENT NOTE 05/24/2012  Patient:  [name], [name]   Account Number:  [id number]  Date Initiated:  05/24/2012  Documentation initiated by:  GRAVES-BIGELOW,Sima Lindenberger  Subjective/Objective Assessment:   Pt admitted with cp. Plan for nuclear stress test today. Pt has family support.     Action/Plan:   Cm did make a referral to [facility]. Will continue to f/u for additional d/c needs. Pt has used [facility] in the past for RN services.   Anticipated DC Date:  05/26/2012   Anticipated DC Plan:  HOME W HOME HEALTH SERVICES      DC Planning Services  CM consult      Choice offered to / List presented to:             Status of service:  In process, will continue to follow Medicare Important Message given?   (If response is "NO", the following Medicare IM given date fields will be blank) Date Medicare IM given:   Date Additional Medicare IM given:    Discharge Disposition:    Per UR Regulation:  Reviewed for med. necessity/level of care/duration of stay  If discussed at Long Length of Stay Meetings, dates discussed:    Comments:

## 2012-05-24 NOTE — Progress Notes (Signed)
Thank you to Tomi Bamberger RN CM for this referral. Patient is ineligible for [facility] services at this time because her primary care physician, Dr Algie Coffer, is not [facility] affiliated.

## 2012-05-24 NOTE — Progress Notes (Signed)
ANTICOAGULATION CONSULT NOTE  Pharmacy Consult for Heparin Indication: chest pain/ACS  Allergies  Allergen Reactions  . Food Rash    "lasagna"= rash from the noodles  . Penicillins Hives  . Sulfamethoxazole W-Trimethoprim Hives and Nausea And Vomiting    Patient Measurements: Height: 5\' 2"  (157.5 cm) Weight: 340 lb 8 oz (154.45 kg) IBW/kg (Calculated) : 50.1  Heparin Dosing Weight: 97kg  Vital Signs: Temp: 97.7 F (36.5 C) (12/04 2100) Temp src: Oral (12/04 1418) BP: 130/84 mmHg (12/04 2100) Pulse Rate: 84  (12/04 2100)  Labs:  Basename 05/23/12 2318 05/23/12 1800 05/23/12 1556 05/23/12 1428 05/23/12 0646 05/23/12 0429  HGB -- -- -- 12.1 -- 12.3  HCT -- -- -- 38.9 -- 38.9  PLT -- -- -- 122* -- 125*  APTT -- -- 30 -- -- --  LABPROT -- -- 12.9 -- -- --  INR -- -- 0.98 -- -- --  HEPARINUNFRC 0.21* -- -- -- -- --  CREATININE -- -- -- 0.86 -- 0.78  CKTOTAL -- -- -- -- -- --  CKMB -- -- -- -- -- --  TROPONINI -- <0.30 -- -- <0.30 <0.30    Estimated Creatinine Clearance: 102.2 ml/min (by C-G formula based on Cr of 0.86).  Assessment: 61 yo female with chest pain for heparin.  Goal of Therapy:  Heparin level 0.3-0.7 units/ml Monitor platelets by anticoagulation protocol: Yes   Plan:  Heparin 2000 units IV bolus, then increase heparin 1700 units/hr Follow-up am labs.  Eddie Candle 05/24/2012,12:06 AM

## 2012-05-24 NOTE — Progress Notes (Signed)
ANTICOAGULATION CONSULT NOTE  Pharmacy Consult for Heparin Indication: chest pain/ACS  Allergies  Allergen Reactions  . Food Rash    "lasagna"= rash from the noodles  . Penicillins Hives  . Sulfamethoxazole W-Trimethoprim Hives and Nausea And Vomiting   Patient Measurements: Height: 5\' 2"  (157.5 cm) Weight: 340 lb 8 oz (154.45 kg) IBW/kg (Calculated) : 50.1  Heparin Dosing Weight: 97kg  Vital Signs: Temp: 98.4 F (36.9 C) (12/05 0500) Temp src: Oral (12/05 0500) BP: 98/47 mmHg (12/05 0500) Pulse Rate: 81  (12/05 0500)  Labs:  Basename 05/24/12 0630 05/24/12 0627 05/23/12 2318 05/23/12 1800 05/23/12 1556 05/23/12 1428 05/23/12 0429  HGB -- 11.9* -- -- -- 12.1 --  HCT -- 38.9 -- -- -- 38.9 38.9  PLT -- 127* -- -- -- 122* 125*  APTT -- -- -- -- 30 -- --  LABPROT -- 13.0 -- -- 12.9 -- --  INR -- 0.99 -- -- 0.98 -- --  HEPARINUNFRC -- 0.51 0.21* -- -- -- --  CREATININE -- 0.83 -- -- -- 0.86 0.78  CKTOTAL -- -- -- -- -- -- --  CKMB -- -- -- -- -- -- --  TROPONINI <0.30 -- <0.30 <0.30 -- -- --   Estimated Creatinine Clearance: 105.9 ml/min (by C-G formula based on Cr of 0.83).  Assessment: 61 yo female admitted with recurrent chest pain and syncopal episodes.  She was started on IV heparin until work-up could be complete.  No noted overt bleeding and CBC is stable 11.9/38.9.  She has some thrombocytopenia with platelets at 127K but this was baseline on admission.  Her heparin level this morning is within desired goal range of 0.3-0.7 at 0.51IU/ml.  Goal of Therapy:  Heparin level 0.3-0.7 units/ml Monitor platelets by anticoagulation protocol: Yes   Plan:   Will continue current heparin at 1700 units/hr.  F/U daily CBC/heparin level  Monitor for s/s of bleeding.  Nadara Mustard, PharmD., MS Clinical Pharmacist Pager:  [phone] Thank you for allowing pharmacy to be part of this patients care team. 05/24/2012,9:19 AM

## 2012-05-24 NOTE — Progress Notes (Signed)
Subjective:  No new complaints.    Objective:  Vital Signs in the last 24 hours: Temp:  [97.7 F (36.5 C)-98.4 F (36.9 C)] 98.4 F (36.9 C) (12/05 1420) Pulse Rate:  [74-85] 74  (12/05 1420) Cardiac Rhythm:  [-] Normal sinus rhythm (12/05 0728) Resp:  [18] 18  (12/05 1420) BP: (98-139)/(47-84) 139/82 mmHg (12/05 1420) SpO2:  [95 %-96 %] 95 % (12/05 1420) Weight:  [154.45 kg (340 lb 8 oz)] 154.45 kg (340 lb 8 oz) (12/05 0500)  Physical Exam: BP Readings from Last 1 Encounters:  05/24/12 139/82    Wt Readings from Last 1 Encounters:  05/24/12 154.45 kg (340 lb 8 oz)    Weight change:   HEENT: [place]/AT, Eyes-Brown, PERL, EOMI, Conjunctiva-Pink, Sclera-Non-icteric Neck: No JVD, No bruit, Trachea midline. Lungs:  Clear, Bilateral. Cardiac:  Regular rhythm, normal S1 and S2, no S3.  Abdomen:  Soft, non-tender. Extremities:  No edema present. No cyanosis. No clubbing. CNS: AxOx3, Cranial nerves grossly intact, moves all 4 extremities. Right handed. Skin: Warm and dry.   Intake/Output from previous day:      Lab Results: BMET    Component Value Date/Time   NA 141 05/24/2012 0627   K 4.0 05/24/2012 0627   CL 103 05/24/2012 0627   CO2 31 05/24/2012 0627   GLUCOSE 86 05/24/2012 0627   BUN 6 05/24/2012 0627   CREATININE 0.83 05/24/2012 0627   CALCIUM 9.2 05/24/2012 0627   GFRNONAA 76* 05/24/2012 0627   GFRAA 88* 05/24/2012 0627   CBC    Component Value Date/Time   WBC 3.9* 05/24/2012 0627   RBC 3.70* 05/24/2012 0627   HGB 11.9* 05/24/2012 0627   HCT 38.9 05/24/2012 0627   PLT 127* 05/24/2012 0627   MCV 105.1* 05/24/2012 0627   MCH 32.2 05/24/2012 0627   MCHC 30.6 05/24/2012 0627   RDW 14.9 05/24/2012 0627   LYMPHSABS 2.6 05/23/2012 1428   MONOABS 0.7 05/23/2012 1428   EOSABS 0.0 05/23/2012 1428   BASOSABS 0.0 05/23/2012 1428   CARDIAC ENZYMES Lab Results  Component Value Date   CKTOTAL 26 03/04/2012   CKMB 1.8 03/04/2012   TROPONINI <0.30 05/24/2012    Assessment/Plan:  Patient  Active Hospital Problem List: 1. Recurrent chest pain rule out myocardial infarction.  2. Hypertension.  3. Hypercholesteremia.  4. History of atrial fibrillation and flutter in the past.  5. Hypothyroidism.  6. Morbid obesity.  7. Depression.  8. History of schizoaffective disorder.  9. Chronic obstructive pulmonary disease.  10.Gastroesophageal reflux disease 11. ? Hypokalemia  Abnormal K+ level becomes normal without supplementation. Will recheck. Could not do nuclear testing due to weight limit on table. Will treat medically for now. Patient advised to lose 4 to 5 pounds/month.   LOS: 1 day    Orpah Cobb  MD  05/24/2012, 6:55 PM

## 2012-05-25 ENCOUNTER — Encounter (HOSPITAL_COMMUNITY): Payer: Self-pay

## 2012-05-25 ENCOUNTER — Other Ambulatory Visit (HOSPITAL_COMMUNITY): Payer: Self-pay

## 2012-05-25 LAB — CBC
HCT: 40 % (ref 36.0–46.0)
MCV: 105.8 fL — ABNORMAL HIGH (ref 78.0–100.0)
Platelets: 132 10*3/uL — ABNORMAL LOW (ref 150–400)
RBC: 3.78 MIL/uL — ABNORMAL LOW (ref 3.87–5.11)
WBC: 5.4 10*3/uL (ref 4.0–10.5)

## 2012-05-25 LAB — BASIC METABOLIC PANEL
BUN: 5 mg/dL — ABNORMAL LOW (ref 6–23)
Chloride: 104 mEq/L (ref 96–112)
Creatinine, Ser: 0.81 mg/dL (ref 0.50–1.10)
GFR calc Af Amer: >90 mL/min (ref >90)
Glucose, Bld: 91 mg/dL (ref 70–99)

## 2012-05-25 MED ORDER — SIMVASTATIN 20 MG PO TABS: 20.0000 mg | ORAL_TABLET | Freq: Every day | ORAL | Status: DC

## 2012-05-25 NOTE — Progress Notes (Signed)
   CARE MANAGEMENT NOTE 05/25/2012  Patient:  [name], [name]   Account Number:  [id number]  Date Initiated:  05/24/2012  Documentation initiated by:  GRAVES-BIGELOW,Valary Manahan  Subjective/Objective Assessment:   Pt admitted with cp. Plan for nuclear stress test today. Pt has family support.     Action/Plan:   Cm did make a referral to [facility]. Will continue to f/u for additional d/c needs. Pt has used [facility] in the past for RN services.   Anticipated DC Date:  05/26/2012   Anticipated DC Plan:  HOME W HOME HEALTH SERVICES      DC Planning Services  CM consult      [facility] Choice  HOME HEALTH   Choice offered to / List presented to:  C-3 Spouse        HH arranged  HH-1 RN  HH-10 DISEASE MANAGEMENT      HH agency  [facility]   Status of service:  Completed, signed off Medicare Important Message given?   (If response is "NO", the following Medicare IM given date fields will be blank) Date Medicare IM given:   Date Additional Medicare IM given:    Discharge Disposition:  HOME W HOME HEALTH SERVICES  Per UR Regulation:  Reviewed for med. necessity/level of care/duration of stay  If discussed at Long Length of Stay Meetings, dates discussed:    Comments:  05-25-12 [address] Tomi Bamberger, RN,BSN [phone] CM did speak to significant other due to pt not arousable when CM visited room. He is agreealble for [facility] RN to visit. CM did call MD for orders and f36f and left VM for CSW Megan for transportation services. CSW to call significant other to make sure he is at home in time of her arrival.

## 2012-05-25 NOTE — Discharge Summary (Signed)
Physician Discharge Summary  Patient ID: JINX GILDEN MRN: [id number] DOB/AGE: [birth year] 59 y.o.  Admit date: 05/23/2012 Discharge date: 05/25/2012  Admission Diagnoses: 1. Recurrent chest pain rule out myocardial infarction.  2. Hypertension.  3. Hypercholesteremia.  4. History of atrial fibrillation and flutter in the past.  5. Hypothyroidism.  6. Morbid obesity.  7. Depression.  8. History of schizoaffective disorder.  9. Chronic obstructive pulmonary disease.  10.Gastroesophageal reflux disease  11.Syncope  Discharge Diagnoses:  Principle Problem: * 1. Recurrent chest pain, myocardial infarction ruled out.  2. Hypertension.  3. Hypercholesteremia.  4. History of atrial fibrillation and flutter in the past.  5. Hypothyroidism.  6. Morbid obesity.  7. Depression.  8. History of schizoaffective disorder.  9. Chronic obstructive pulmonary disease.  10.Gastroesophageal reflux disease  11. Possible Hypokalemia 12. Syncope   Discharged Condition: fair  Hospital Course: 61 years old with anxiety over chest pain and syncope. Negative work up and CT chest on admission. She could not finish nuclear stress test due to weight limit. No significant dysrhythmia on monitor. She was discharged home in stable condition. Zocor was added for possible CAD.  Consults: None  Significant Diagnostic Studies: labs: Near normal CBC and electrolytes with questionable hypokalemia, improving with supplementation.  Treatments: Telemetry and negative cardiac enzymes.  Discharge Exam: Blood pressure 136/82, pulse 83, temperature 98.6 F (37 C), temperature source Oral, resp. rate 21, height 5\' 2"  (1.575 m), weight 159.167 kg (350 lb 14.4 oz), SpO2 96.00%. Morbidly obese female in no distress. HEENT: [place]/AT, Eyes-Brown, PERL, EOMI, Conjunctiva-Pink, Sclera-Non-icteric  Neck: No JVD, No bruit, Trachea midline.  Lungs: Clear, Bilateral.  Cardiac: Regular rhythm, normal S1 and S2, no S3.   Abdomen: Soft, non-tender.  Extremities: Trace edema present. No cyanosis. No clubbing.  CNS: AxOx3, Cranial nerves grossly intact, moves all 4 extremities. Right handed.  Skin: Warm and dry.   Disposition: 01-Home or Self Care     Medication List     As of 05/25/2012 11:21 AM    TAKE these medications         albuterol 108 (90 BASE) MCG/ACT inhaler   Commonly known as: PROVENTIL HFA;VENTOLIN HFA   Inhale 1 puff into the lungs every 6 (six) hours as needed. For wheezing      aspirin EC 81 MG tablet   Take 1 tablet (81 mg total) by mouth daily. For platelet aggregation.      divalproex 500 MG DR tablet   Commonly known as: DEPAKOTE   Take 1 tablet (500 mg total) by mouth at bedtime. For bipolar mood disorder.      fluticasone 50 MCG/ACT nasal spray   Commonly known as: FLONASE   Place 2 sprays into the nose daily as needed. For nasal congestion      folic acid 1 MG tablet   Commonly known as: FOLVITE   Take 1 tablet (1 mg total) by mouth daily. For nutritional supplement.      levothyroxine 150 MCG tablet   Commonly known as: SYNTHROID, LEVOTHROID   Take 1 tablet (150 mcg total) by mouth daily. For thyroid disease.      lisinopril 20 MG tablet   Commonly known as: PRINIVIL,ZESTRIL   Take 1 tablet (20 mg total) by mouth 2 (two) times daily. For hypertension.      multivitamin with minerals Tabs   Take 1 tablet by mouth daily. For nutritional supplement.      nitroGLYCERIN 0.4 MG SL tablet   Commonly known  as: NITROSTAT   Place 0.4 mg under the tongue once.      omeprazole 20 MG capsule   Commonly known as: PRILOSEC   Take 1 capsule (20 mg total) by mouth daily.      oxyCODONE-acetaminophen 5-325 MG per tablet   Commonly known as: PERCOCET/ROXICET   Take 1 tablet by mouth every 4 (four) hours as needed for pain.      simvastatin 20 MG tablet   Commonly known as: ZOCOR   Take 1 tablet (20 mg total) by mouth at bedtime.      traMADol 50 MG tablet   Commonly  known as: ULTRAM   Take 1 tablet (50 mg total) by mouth every 6 (six) hours as needed for pain. For pain         Signed: Affan Callow S 05/25/2012, 11:21 AM

## 2012-05-30 ENCOUNTER — Observation Stay (HOSPITAL_COMMUNITY): Payer: Medicare Other

## 2012-05-30 ENCOUNTER — Encounter (HOSPITAL_COMMUNITY): Payer: Self-pay | Admitting: General Practice

## 2012-05-30 ENCOUNTER — Inpatient Hospital Stay (HOSPITAL_COMMUNITY)
Admission: EM | Admit: 2012-05-30 | Discharge: 2012-06-03 | DRG: 313 | Disposition: A | Discharge status: Home / Self Care | Payer: Medicare Other | Attending: Cardiology | Admitting: Cardiology

## 2012-05-30 DIAGNOSIS — J449 Chronic obstructive pulmonary disease, unspecified: Secondary | ICD-10-CM | POA: Diagnosis present

## 2012-05-30 DIAGNOSIS — Z6841 Body Mass Index (BMI) 40.0 and over, adult: Secondary | ICD-10-CM

## 2012-05-30 DIAGNOSIS — J4489 Other specified chronic obstructive pulmonary disease: Secondary | ICD-10-CM | POA: Diagnosis present

## 2012-05-30 DIAGNOSIS — I252 Old myocardial infarction: Secondary | ICD-10-CM

## 2012-05-30 DIAGNOSIS — I4892 Unspecified atrial flutter: Secondary | ICD-10-CM | POA: Diagnosis present

## 2012-05-30 DIAGNOSIS — R0789 Other chest pain: Principal | ICD-10-CM | POA: Diagnosis present

## 2012-05-30 DIAGNOSIS — R079 Chest pain, unspecified: Secondary | ICD-10-CM

## 2012-05-30 DIAGNOSIS — N390 Urinary tract infection, site not specified: Secondary | ICD-10-CM

## 2012-05-30 DIAGNOSIS — E785 Hyperlipidemia, unspecified: Secondary | ICD-10-CM | POA: Diagnosis present

## 2012-05-30 DIAGNOSIS — I2510 Atherosclerotic heart disease of native coronary artery without angina pectoris: Secondary | ICD-10-CM | POA: Diagnosis present

## 2012-05-30 DIAGNOSIS — E039 Hypothyroidism, unspecified: Secondary | ICD-10-CM

## 2012-05-30 DIAGNOSIS — I10 Essential (primary) hypertension: Secondary | ICD-10-CM

## 2012-05-30 DIAGNOSIS — E669 Obesity, unspecified: Secondary | ICD-10-CM

## 2012-05-30 DIAGNOSIS — F17200 Nicotine dependence, unspecified, uncomplicated: Secondary | ICD-10-CM | POA: Diagnosis present

## 2012-05-30 DIAGNOSIS — N179 Acute kidney failure, unspecified: Secondary | ICD-10-CM | POA: Diagnosis present

## 2012-05-30 DIAGNOSIS — I219 Acute myocardial infarction, unspecified: Secondary | ICD-10-CM

## 2012-05-30 DIAGNOSIS — I4891 Unspecified atrial fibrillation: Secondary | ICD-10-CM

## 2012-05-30 DIAGNOSIS — E662 Morbid (severe) obesity with alveolar hypoventilation: Secondary | ICD-10-CM | POA: Diagnosis present

## 2012-05-30 DIAGNOSIS — G40909 Epilepsy, unspecified, not intractable, without status epilepticus: Secondary | ICD-10-CM | POA: Diagnosis present

## 2012-05-30 HISTORY — DX: Shortness of breath: R06.02

## 2012-05-30 HISTORY — DX: Other abnormalities of breathing: R06.89

## 2012-05-30 HISTORY — DX: Unspecified atrial flutter: I48.92

## 2012-05-30 LAB — CBC WITH DIFFERENTIAL/PLATELET
Basophils Absolute: 0 10*3/uL (ref 0.0–0.1)
Basophils Relative: 0 % (ref 0–1)
Eosinophils Absolute: 0.1 10*3/uL (ref 0.0–0.7)
Eosinophils Relative: 1 % (ref 0–5)
HCT: 39.7 % (ref 36.0–46.0)
MCH: 33.3 pg (ref 26.0–34.0)
MCHC: 33 g/dL (ref 30.0–36.0)
MCV: 101 fL — ABNORMAL HIGH (ref 78.0–100.0)
Monocytes Absolute: 0.6 10*3/uL (ref 0.1–1.0)
Neutro Abs: 2.4 10*3/uL (ref 1.7–7.7)
RDW: 14.9 % (ref 11.5–15.5)

## 2012-05-30 LAB — COMPREHENSIVE METABOLIC PANEL
AST: 34 U/L (ref 0–37)
Albumin: 3 g/dL — ABNORMAL LOW (ref 3.5–5.2)
BUN: 7 mg/dL (ref 6–23)
Calcium: 9.9 mg/dL (ref 8.4–10.5)
Creatinine, Ser: 0.87 mg/dL (ref 0.50–1.10)
Total Protein: 6.3 g/dL (ref 6.0–8.3)

## 2012-05-30 LAB — CK TOTAL AND CKMB (NOT AT ARMC): Relative Index: 1.2 (ref 0.0–2.5)

## 2012-05-30 LAB — URINALYSIS, ROUTINE W REFLEX MICROSCOPIC
Glucose, UA: NEGATIVE mg/dL
Nitrite: POSITIVE — AB
Specific Gravity, Urine: 1.017 (ref 1.005–1.030)
pH: 6.5 (ref 5.0–8.0)

## 2012-05-30 LAB — URINE MICROSCOPIC-ADD ON

## 2012-05-30 LAB — TROPONIN I
Troponin I: <0.3 ng/mL (ref <0.30)
Troponin I: <0.3 ng/mL

## 2012-05-30 MED ORDER — LEVOTHYROXINE SODIUM 150 MCG PO TABS
150.0000 ug | ORAL_TABLET | Freq: Every day | ORAL | Status: DC
  Administered 2012-05-31 – 2012-06-03 (×4): 150 ug via ORAL
  Filled 2012-05-30 (×4): qty 1

## 2012-05-30 MED ORDER — ONDANSETRON HCL 4 MG PO TABS: 4.0000 mg | ORAL_TABLET | Freq: Four times a day (QID) | ORAL | Status: DC | PRN

## 2012-05-30 MED ORDER — GI COCKTAIL ~~LOC~~
30.0000 mL | Freq: Once | ORAL | Status: AC
  Administered 2012-05-30: 30 mL via ORAL
  Filled 2012-05-30: qty 30

## 2012-05-30 MED ORDER — SODIUM CHLORIDE 0.9 % IJ SOLN
3.0000 mL | Freq: Two times a day (BID) | INTRAMUSCULAR | Status: DC
  Administered 2012-05-30 – 2012-06-03 (×8): 3 mL via INTRAVENOUS

## 2012-05-30 MED ORDER — SIMVASTATIN 20 MG PO TABS
20.0000 mg | ORAL_TABLET | Freq: Every day | ORAL | Status: DC
  Administered 2012-05-30 – 2012-05-31 (×2): 20 mg via ORAL
  Filled 2012-05-30 (×3): qty 1

## 2012-05-30 MED ORDER — ONDANSETRON HCL 4 MG/2ML IJ SOLN: 4.0000 mg | Freq: Four times a day (QID) | INTRAMUSCULAR | Status: DC | PRN

## 2012-05-30 MED ORDER — OXYCODONE-ACETAMINOPHEN 5-325 MG PO TABS
1.0000 | ORAL_TABLET | ORAL | Status: DC | PRN
  Administered 2012-05-31 – 2012-06-03 (×5): 1 via ORAL
  Filled 2012-05-30 (×5): qty 1

## 2012-05-30 MED ORDER — DIVALPROEX SODIUM 500 MG PO DR TAB
500.0000 mg | DELAYED_RELEASE_TABLET | Freq: Every day | ORAL | Status: DC
  Administered 2012-05-30 – 2012-06-02 (×4): 500 mg via ORAL
  Filled 2012-05-30 (×5): qty 1

## 2012-05-30 MED ORDER — FOLIC ACID 1 MG PO TABS
1.0000 mg | ORAL_TABLET | Freq: Every day | ORAL | Status: DC
  Administered 2012-05-30 – 2012-06-03 (×5): 1 mg via ORAL
  Filled 2012-05-30 (×5): qty 1

## 2012-05-30 MED ORDER — TRAMADOL HCL 50 MG PO TABS
50.0000 mg | ORAL_TABLET | Freq: Four times a day (QID) | ORAL | Status: DC | PRN
  Filled 2012-05-30: qty 1

## 2012-05-30 MED ORDER — LISINOPRIL 20 MG PO TABS
20.0000 mg | ORAL_TABLET | Freq: Every day | ORAL | Status: DC
  Filled 2012-05-30: qty 1

## 2012-05-30 MED ORDER — ACETAMINOPHEN 325 MG PO TABS: 650.0000 mg | ORAL_TABLET | Freq: Four times a day (QID) | ORAL | Status: DC | PRN

## 2012-05-30 MED ORDER — MORPHINE SULFATE 4 MG/ML IJ SOLN
4.0000 mg | Freq: Once | INTRAMUSCULAR | Status: AC
  Administered 2012-05-30: 4 mg via INTRAVENOUS
  Filled 2012-05-30: qty 1

## 2012-05-30 MED ORDER — ADULT MULTIVITAMIN W/MINERALS CH
1.0000 | ORAL_TABLET | Freq: Every day | ORAL | Status: DC
  Administered 2012-05-31 – 2012-06-03 (×4): 1 via ORAL
  Filled 2012-05-30 (×4): qty 1

## 2012-05-30 MED ORDER — DEXTROSE 5 % IV SOLN: 1.0000 g | INTRAVENOUS | Status: DC

## 2012-05-30 MED ORDER — CIPROFLOXACIN HCL 500 MG PO TABS
500.0000 mg | ORAL_TABLET | Freq: Once | ORAL | Status: AC
  Administered 2012-05-30: 500 mg via ORAL
  Filled 2012-05-30: qty 1

## 2012-05-30 MED ORDER — IPRATROPIUM BROMIDE 0.02 % IN SOLN
0.5000 mg | Freq: Four times a day (QID) | RESPIRATORY_TRACT | Status: DC
  Administered 2012-05-30 – 2012-06-03 (×14): 0.5 mg via RESPIRATORY_TRACT
  Filled 2012-05-30 (×15): qty 2.5

## 2012-05-30 MED ORDER — ONDANSETRON HCL 4 MG/2ML IJ SOLN
4.0000 mg | Freq: Once | INTRAMUSCULAR | Status: AC
  Administered 2012-05-30: 4 mg via INTRAVENOUS
  Filled 2012-05-30: qty 2

## 2012-05-30 MED ORDER — ALBUTEROL SULFATE (5 MG/ML) 0.5% IN NEBU
2.5000 mg | INHALATION_SOLUTION | Freq: Four times a day (QID) | RESPIRATORY_TRACT | Status: DC
  Administered 2012-05-30 – 2012-06-03 (×14): 2.5 mg via RESPIRATORY_TRACT
  Filled 2012-05-30 (×15): qty 0.5

## 2012-05-30 MED ORDER — DEXTROSE 5 % IV SOLN
1.0000 g | INTRAVENOUS | Status: DC
  Administered 2012-05-30 – 2012-06-01 (×3): 1 g via INTRAVENOUS
  Filled 2012-05-30 (×3): qty 10

## 2012-05-30 MED ORDER — ACETAMINOPHEN 650 MG RE SUPP: 650.0000 mg | Freq: Four times a day (QID) | RECTAL | Status: DC | PRN

## 2012-05-30 MED ORDER — ENOXAPARIN SODIUM 40 MG/0.4ML ~~LOC~~ SOLN
40.0000 mg | SUBCUTANEOUS | Status: DC
  Administered 2012-05-30 – 2012-06-02 (×4): 40 mg via SUBCUTANEOUS
  Filled 2012-05-30 (×5): qty 0.4

## 2012-05-30 MED ORDER — PANTOPRAZOLE SODIUM 40 MG PO TBEC
40.0000 mg | DELAYED_RELEASE_TABLET | Freq: Every day | ORAL | Status: DC
  Administered 2012-05-30 – 2012-06-02 (×4): 40 mg via ORAL
  Filled 2012-05-30 (×5): qty 1

## 2012-05-30 MED ORDER — ASPIRIN EC 325 MG PO TBEC
325.0000 mg | DELAYED_RELEASE_TABLET | Freq: Every day | ORAL | Status: DC
  Administered 2012-05-31: 325 mg via ORAL
  Filled 2012-05-30: qty 1

## 2012-05-30 MED ORDER — FLUTICASONE PROPIONATE 50 MCG/ACT NA SUSP
2.0000 | Freq: Every day | NASAL | Status: DC
  Administered 2012-05-30 – 2012-06-03 (×5): 2 via NASAL
  Filled 2012-05-30 (×2): qty 16

## 2012-05-30 MED ORDER — NITROGLYCERIN 0.4 MG SL SUBL: 0.4000 mg | SUBLINGUAL_TABLET | SUBLINGUAL | Status: DC | PRN

## 2012-05-30 NOTE — Consult Note (Signed)
Reason for Consult: Recurrent chest pain Referring Physician: [facility] hospitalist  [name] is an 61 y.o. female.  HPI: Patient is 61 year old female with past medical history significant for multiple medical problems i.e. hypertension, COPD, obstructive sleep apnea/obesity hypoventilation syndrome, hypothyroidism, history of tobacco abuse, morbidly obese, history of seizure disorder, mild coronary artery disease by cardiac cath a few years ago and negative nuclear stress test approximately 6 months ago, history of bipolar disorder, history of paroxysmal atrial flutter, history of hiatus hernia, came to the ER complaining of recurrent retrosternal chest pain and generalized pain in the arms and legs for last 2-3 days. States chest pain is constant and gets worse with movement. Also complains of shortness of breath with minimal exertion which is persistent. Patient denies any cough fever chills. But complains of dysuria. Patient denies any palpitation lightheadedness or syncope. Patient was recently discharged from the hospital and had negative cardiac markers stress test could not be done because of her morbid obesity. Patient denies any PND orthopnea or leg swelling.  Past Medical History  Diagnosis Date  . Schizoaffective disorder   . Urinary tract infection   . Hypertension   . Atrial fibrillation   . GERD (gastroesophageal reflux disease)   . Hypothyroidism   . Obesity   . Asthma   . COPD (chronic obstructive pulmonary disease)   . CHF (congestive heart failure)   . Seizures   . Morbid obesity   . Chronic pain   . Hypercholesteremia   . Anginal pain   . Myocardial infarction 2002  . Pneumonia     "several times" (05/23/2012)  . Exertional dyspnea   . Diabetes mellitus     "I'm not diabetic anymore" (05/23/2012)  . Anemia   . History of blood transfusion 1993    "when I had my hysterectomy" (05/23/2012)  . H/O hiatal hernia   . Daily headache   . Arthritis     "severe; all  over my body" (05/23/2012)    Past Surgical History  Procedure Date  . Vaginal hysterectomy 1993  . Tubal ligation 1998  . Cholecystectomy ?2008  . Cardiac catheterization 2008    Family History  Problem Relation Age of Onset  . Heart disease Father   . Cancer Mother 65    Breast    Social History:  reports that she quit smoking about 11 years ago. Her smoking use included Cigarettes. She has a 24 pack-year smoking history. Her smokeless tobacco use includes Snuff and Chew. She reports that she does not drink alcohol or use illicit drugs.  Allergies:  Allergies  Allergen Reactions  . Food Rash    "lasagna"= rash from the noodles  . Penicillins Hives  . Sulfamethoxazole W-Trimethoprim Hives and Nausea And Vomiting    Medications: I have reviewed the patient's current medications.  Results for orders placed during the hospital encounter of 05/30/12 (from the past 48 hour(s))  CBC WITH DIFFERENTIAL     Status: Abnormal   Collection Time   05/30/12 12:04 PM      Component Value Range Comment   WBC 4.8  4.0 - 10.5 K/uL    RBC 3.93  3.87 - 5.11 MIL/uL    Hemoglobin 13.1  12.0 - 15.0 g/dL    HCT 47.8  29.5 - 62.1 %    MCV 101.0 (*) 78.0 - 100.0 fL    MCH 33.3  26.0 - 34.0 pg    MCHC 33.0  30.0 - 36.0 g/dL  RDW 14.9  11.5 - 15.5 %    Platelets 107 (*) 150 - 400 K/uL PLATELET COUNT CONFIRMED BY SMEAR   Neutrophils Relative 50  43 - 77 %    Neutro Abs 2.4  1.7 - 7.7 K/uL    Lymphocytes Relative 37  12 - 46 %    Lymphs Abs 1.8  0.7 - 4.0 K/uL    Monocytes Relative 12  3 - 12 %    Monocytes Absolute 0.6  0.1 - 1.0 K/uL    Eosinophils Relative 1  0 - 5 %    Eosinophils Absolute 0.1  0.0 - 0.7 K/uL    Basophils Relative 0  0 - 1 %    Basophils Absolute 0.0  0.0 - 0.1 K/uL   COMPREHENSIVE METABOLIC PANEL     Status: Abnormal   Collection Time   05/30/12 12:04 PM      Component Value Range Comment   Sodium 135  135 - 145 mEq/L    Potassium 3.6  3.5 - 5.1 mEq/L     Chloride 97  96 - 112 mEq/L    CO2 29  19 - 32 mEq/L    Glucose, Bld 92  70 - 99 mg/dL    BUN 7  6 - 23 mg/dL    Creatinine, Ser 9.14  0.50 - 1.10 mg/dL    Calcium 9.9  8.4 - 78.2 mg/dL    Total Protein 6.3  6.0 - 8.3 g/dL    Albumin 3.0 (*) 3.5 - 5.2 g/dL    AST 34  0 - 37 U/L    ALT 25  0 - 35 U/L    Alkaline Phosphatase 54  39 - 117 U/L    Total Bilirubin 0.4  0.3 - 1.2 mg/dL    GFR calc non Af Amer 72 (*) >90 mL/min    GFR calc Af Amer 83 (*) >90 mL/min   TROPONIN I     Status: Normal   Collection Time   05/30/12 12:05 PM      Component Value Range Comment   Troponin I <0.30  <0.30 ng/mL   URINALYSIS, ROUTINE W REFLEX MICROSCOPIC     Status: Abnormal   Collection Time   05/30/12 12:06 PM      Component Value Range Comment   Color, Urine YELLOW  YELLOW    APPearance CLOUDY (*) CLEAR    Specific Gravity, Urine 1.017  1.005 - 1.030    pH 6.5  5.0 - 8.0    Glucose, UA NEGATIVE  NEGATIVE mg/dL    Hgb urine dipstick NEGATIVE  NEGATIVE    Bilirubin Urine SMALL (*) NEGATIVE    Ketones, ur NEGATIVE  NEGATIVE mg/dL    Protein, ur NEGATIVE  NEGATIVE mg/dL    Urobilinogen, UA 1.0  0.0 - 1.0 mg/dL    Nitrite POSITIVE (*) NEGATIVE    Leukocytes, UA LARGE (*) NEGATIVE   URINE MICROSCOPIC-ADD ON     Status: Abnormal   Collection Time   05/30/12 12:06 PM      Component Value Range Comment   Squamous Epithelial / LPF FEW (*) RARE    WBC, UA 21-50  <3 WBC/hpf    RBC / HPF 0-2  <3 RBC/hpf    Bacteria, UA MANY (*) RARE     No results found.  Review of Systems  Constitutional: Negative for fever and chills.  Eyes: Negative for blurred vision and double vision.  Respiratory: Positive for shortness of breath. Negative for  cough, hemoptysis, sputum production and wheezing.   Cardiovascular: Positive for chest pain. Negative for palpitations, orthopnea, claudication and leg swelling.  Gastrointestinal: Positive for nausea. Negative for vomiting, abdominal pain and diarrhea.   Neurological: Negative for dizziness and headaches.   Blood pressure 95/60, pulse 109, temperature 98.2 F (36.8 C), temperature source Oral, resp. rate 20, weight 151.728 kg (334 lb 8 oz), SpO2 95.00%. Physical Exam  Constitutional: She is oriented to person, place, and time.  HENT:  Head: Normocephalic and atraumatic.  Eyes: Conjunctivae normal are normal. Left eye exhibits no discharge. No scleral icterus.  Neck: Normal range of motion. Neck supple. No JVD present. No thyromegaly present.  Cardiovascular: Normal rate and regular rhythm.  Exam reveals no gallop and no friction rub.   Murmur (Faint systolic murmur noted no S3 gallop) heard. Respiratory: Breath sounds normal. No respiratory distress. She has no wheezes. She has no rales.  GI: Soft. Bowel sounds are normal. She exhibits distension. There is no tenderness. There is no rebound and no guarding.  Musculoskeletal: She exhibits no edema and no tenderness.  Neurological: She is alert and oriented to person, place, and time.    Assessment/Plan: Atypical chest pain with negative nuclear stress test in the recent past Mild coronary artery disease Hypertension History of paroxysmal atrial flutter COPD Obstructive sleep apnea/obesity hypoventilation syndrome Hypothyroidism Tobacco abuse Morbid obesity Hiatus hernia Seizure disorder UTI Plan Agree with present management Will cycle 3 sets of cardiac enzymes if negative can be discharged from cardiac point of view History of schizophrenia History of bipolar disorder  [name] N 05/30/2012, 5:28 PM

## 2012-05-30 NOTE — ED Notes (Addendum)
Per EMS- pt began having chest pain last night with SOB. Pt reports having swallowed a lot of "dip" last night. SPO2 92%. 151/108. HR 100. 20g in left wrist. Pt received 324 of asprin and 1 nitro in route with no relief

## 2012-05-30 NOTE — H&P (Signed)
[facility] History and Physical  ONNA NODAL ZOX:[id number] DOB: 1950/09/18 DOA: 05/30/2012   PCP: No primary provider on file.   Chief Complaint: Chest Pain  HPI:  61 year old female with a history of hypertension, COPD, schizoaffective disorder, paroxysmal atrial fibrillation, and hypothyroidism presents with chest pain that began at 11 PM last night. The patient states that the pain has been constant and unremitting. She denies any radiation down her arms or to her jaw. She complains of some dizziness and nausea. Apparently, the patient stated she had some vomiting last night, but she states that she is hungry now without any abdominal pain and wants to eat. The patient has had numerous hospital admissions for the above complaints. The patient states that nothing helps or makes the pain worse. She states that she is short of breath, but she states that she is always short of breath. She states that her shortness of breath is not much worse than usual. She has a dry nonproductive cough. She denies any hemoptysis, fevers, chills, syncope, abdominal pain, palpitations, dysuria, hematuria, rashes.  Of note, the patient was admitted to Dr. Algie Coffer in November with chest pain. A Myoview could not be completed due to the patient's body habitus. However, Dr. Algie Coffer felt the patient had atypical chest pain. The patient denies any previous history of pulmonary embolus, DVT, recent surgery, or malignancy.  She smokes 2 packs per day x25 years, but she quit 12 years ago. Assessment/Plan: Chest pain -Appears to be noncardiac in nature--initial troponin and EKG-negative for ACS -Reproducible on physical examination with palpation -Low suspicion for pulmonary embolus as her  Wells score is 0 -Cycle troponins -Repeat EKG in the morning -Dr. Sharyn Lull has already been consulted by the ED -Check urine drug screen, TSH, lipid panel -Aspirin 325 mg daily -Continue Percocet and tramadol for  pain -CXR Dyspnea -Suspect underlying COPD in combination with obesity hypoventilation syndrome -Very low suspicion of pulmonary embolus as discussed above -will order a venous duplex legs -Albuterol and Atrovent nebulizer Hypertension -Patient's blood pressure 100-110 -Will decrease lisinopril dose to 20 mg once daily Hyperlipidemia -Continue Zocor -Check lipid panel Pyuria -Continue ceftriaxone pending culture data        Past Medical History  Diagnosis Date  . Schizoaffective disorder   . Urinary tract infection   . Hypertension   . Atrial fibrillation   . GERD (gastroesophageal reflux disease)   . Hypothyroidism   . Obesity   . Asthma   . COPD (chronic obstructive pulmonary disease)   . CHF (congestive heart failure)   . Seizures   . Morbid obesity   . Chronic pain   . Hypercholesteremia   . Anginal pain   . Myocardial infarction 2002  . Pneumonia     "several times" (05/23/2012)  . Exertional dyspnea   . Diabetes mellitus     "I'm not diabetic anymore" (05/23/2012)  . Anemia   . History of blood transfusion 1993    "when I had my hysterectomy" (05/23/2012)  . H/O hiatal hernia   . Daily headache   . Arthritis     "severe; all over my body" (05/23/2012)   Past Surgical History  Procedure Date  . Vaginal hysterectomy 1993  . Tubal ligation 1998  . Cholecystectomy ?2008  . Cardiac catheterization 2008   Social History:  reports that she quit smoking about 11 years ago. Her smoking use included Cigarettes. She has a 24 pack-year smoking history. Her smokeless tobacco use includes Snuff and Chew.  She reports that she does not drink alcohol or use illicit drugs.   Family History  Problem Relation Age of Onset  . Heart disease Father   . Cancer Mother 80    Breast     Allergies  Allergen Reactions  . Food Rash    "lasagna"= rash from the noodles  . Penicillins Hives  . Sulfamethoxazole W-Trimethoprim Hives and Nausea And Vomiting      Prior  to Admission medications   Medication Sig Start Date End Date Taking? Authorizing Provider  albuterol (PROVENTIL HFA;VENTOLIN HFA) 108 (90 BASE) MCG/ACT inhaler Inhale 1 puff into the lungs every 6 (six) hours as needed. For wheezing   Yes Historical Provider, MD  aspirin EC 81 MG tablet Take 1 tablet (81 mg total) by mouth daily. For platelet aggregation. 05/11/12  Yes Verne Spurr, PA-C  divalproex (DEPAKOTE) 500 MG DR tablet Take 1 tablet (500 mg total) by mouth at bedtime. For bipolar mood disorder. 05/11/12  Yes Neil Mashburn, PA-C  fluticasone (FLONASE) 50 MCG/ACT nasal spray Place 2 sprays into the nose daily as needed. For nasal congestion   Yes Historical Provider, MD  folic acid (FOLVITE) 1 MG tablet Take 1 tablet (1 mg total) by mouth daily. For nutritional supplement. 05/11/12  Yes Verne Spurr, PA-C  levothyroxine (SYNTHROID, LEVOTHROID) 150 MCG tablet Take 1 tablet (150 mcg total) by mouth daily. For thyroid disease. 05/11/12  Yes Verne Spurr, PA-C  lisinopril (PRINIVIL,ZESTRIL) 20 MG tablet Take 1 tablet (20 mg total) by mouth 2 (two) times daily. For hypertension. 05/11/12  Yes Verne Spurr, PA-C  Multiple Vitamin (MULTIVITAMIN WITH MINERALS) TABS Take 1 tablet by mouth daily. For nutritional supplement. 05/11/12  Yes Verne Spurr, PA-C  nitroGLYCERIN (NITROSTAT) 0.4 MG SL tablet Place 0.4 mg under the tongue every 5 (five) minutes as needed. For chest pain   Yes Historical Provider, MD  omeprazole (PRILOSEC) 20 MG capsule Take 1 capsule (20 mg total) by mouth daily. 05/11/12  Yes Verne Spurr, PA-C  oxyCODONE-acetaminophen (ROXICET) 5-325 MG per tablet Take 1 tablet by mouth every 4 (four) hours as needed for pain. 05/21/12  Yes Catherine E Schinlever, PA-C  simvastatin (ZOCOR) 20 MG tablet Take 1 tablet (20 mg total) by mouth at bedtime. 05/25/12  Yes Ricki Rodriguez, MD  traMADol (ULTRAM) 50 MG tablet Take 1 tablet (50 mg total) by mouth every 6 (six) hours as needed for pain.  For pain 03/31/12  Yes Renae Fickle, MD    Review of Systems:  Constitutional:  No weight loss, night sweats, Fevers, chills, fatigue.  Head&Eyes: No headache.  No vision loss.  No eye pain or scotoma ENT:  No Difficulty swallowing,Tooth/dental problems,Sore throat,   Cardio-vascular:  No chest pain, Orthopnea, PND,   dizziness,  GI:  No  abdominal pain, nausea, vomiting, diarrhea, loss of appetite, hematochezia, melena, heartburn, indigestion, Resp:  No shortness of breath with exertion or at rest. No cough. No coughing up of blood  Skin:  no rash or lesions.  GU:  no dysuria, change in color of urine, no urgency or frequency. No flank pain.   Psych:  No change in mood or affect. Neurologic: No headache, no dysesthesia, no focal weakness, no vision loss. No syncope  Physical Exam: Filed Vitals:   05/30/12 1055 05/30/12 1146 05/30/12 1340  BP: 115/72  108/72  Pulse: 92  99  Resp: 20  17  Weight:  151.728 kg (334 lb 8 oz)   SpO2: 97%  97%  General:  A&O x 3, NAD, nontoxic, pleasant/cooperative Head/Eye: No conjunctival hemorrhage, no icterus, [place]/AT, No nystagmus ENT:  No icterus,  No thrush, edentulous, no pharyngeal exudate Neck:  No masses, no lymphadenpathy, no bruits CV:  RRR, no rub, no gallop, no S3 Lung:  CTAB, good air movement, no wheeze, no rhonchi Abdomen: soft/NT, +BS, nondistended, no peritoneal signs Ext: No cyanosis, No rashes, No petechiae, No lymphangitis, 2+ edema   Labs on Admission:  Basic Metabolic Panel:  Lab 05/30/12 0347 05/25/12 0515 05/24/12 0627  NA 135 142 141  K 3.6 4.9 --  CL 97 104 103  CO2 29 34* 31  GLUCOSE 92 91 86  BUN 7 5* 6  CREATININE 0.87 0.81 0.83  CALCIUM 9.9 9.6 9.2  MG -- -- --  PHOS -- -- --   Liver Function Tests:  Lab 05/30/12 1204  AST 34  ALT 25  ALKPHOS 54  BILITOT 0.4  PROT 6.3  ALBUMIN 3.0*   No results found for this basename: LIPASE:5,AMYLASE:5 in the last 168 hours No results found for  this basename: AMMONIA:5 in the last 168 hours CBC:  Lab 05/30/12 1204 05/25/12 0515 05/24/12 0627  WBC 4.8 5.4 3.9*  NEUTROABS 2.4 -- --  HGB 13.1 12.5 11.9*  HCT 39.7 40.0 38.9  MCV 101.0* 105.8* 105.1*  PLT 107* 132* 127*   Cardiac Enzymes:  Lab 05/30/12 1205 05/24/12 0630 05/23/12 2318 05/23/12 1800  CKTOTAL -- -- -- --  CKMB -- -- -- --  CKMBINDEX -- -- -- --  TROPONINI <0.30 <0.30 <0.30 <0.30   BNP: No components found with this basename: POCBNP:5 CBG: No results found for this basename: GLUCAP:5 in the last 168 hours  Radiological Exams on Admission: No results found.  EKG: Independently reviewed. Sinus rhythm, nonspecific T wave changes.    Time spent:70 minutes Code Status:   full Family Communication:   Family at bedside   Milind Raether, DO  [facility] Pager [phone]  If 7PM-7AM, please contact night-coverage [email or web address] Password [email or web address] 05/30/2012, 4:18 PM

## 2012-05-30 NOTE — ED Provider Notes (Signed)
History     CSN: [id number]  Arrival date & time 05/30/12  1033   First MD Initiated Contact with Patient 05/30/12 1105      Chief Complaint  Patient presents with  . Chest Pain    (Consider location/radiation/quality/duration/timing/severity/associated sxs/prior treatment) Patient is a 61 y.o. female presenting with chest pain. The history is provided by the patient. No language interpreter was used.  Chest Pain The chest pain began 2 days ago. Chest pain occurs constantly. The chest pain is worsening. The pain is associated with breathing. At its most intense, the pain is at 10/10. The pain is currently at 10/10. The severity of the pain is severe. The quality of the pain is described as stabbing and sharp. The pain does not radiate. Exacerbated by: nothing. Primary symptoms include shortness of breath. Pertinent negatives for primary symptoms include no cough, no nausea and no vomiting.  Pertinent negatives for associated symptoms include no claudication, no near-syncope, no numbness and no weakness. She tried nothing for the symptoms. Risk factors include obesity and smoking/tobacco exposure.  Her past medical history is significant for diabetes, hyperlipidemia and hypertension.  Her family medical history is significant for CAD in family.  Procedure history is positive for exercise treadmill test.     Past Medical History  Diagnosis Date  . Schizoaffective disorder   . Urinary tract infection   . Hypertension   . Atrial fibrillation   . GERD (gastroesophageal reflux disease)   . Hypothyroidism   . Obesity   . Asthma   . COPD (chronic obstructive pulmonary disease)   . CHF (congestive heart failure)   . Seizures   . Morbid obesity   . Chronic pain   . Hypercholesteremia   . Anginal pain   . Myocardial infarction 2002  . Pneumonia     "several times" (05/23/2012)  . Exertional dyspnea   . Diabetes mellitus     "I'm not diabetic anymore" (05/23/2012)  . Anemia   .  History of blood transfusion 1993    "when I had my hysterectomy" (05/23/2012)  . H/O hiatal hernia   . Daily headache   . Arthritis     "severe; all over my body" (05/23/2012)    Past Surgical History  Procedure Date  . Vaginal hysterectomy 1993  . Tubal ligation 1998  . Cholecystectomy ?2008  . Cardiac catheterization 2008    Family History  Problem Relation Age of Onset  . Heart disease Father   . Cancer Mother 35    Breast    History  Substance Use Topics  . Smoking status: Former Smoker -- 2.0 packs/day for 12 years    Types: Cigarettes    Quit date: 06/29/2000  . Smokeless tobacco: Current User    Types: Snuff, Chew  . Alcohol Use: No    OB History    Grav Para Term Preterm Abortions TAB SAB Ect Mult Living                  Review of Systems  Respiratory: Positive for shortness of breath. Negative for cough.   Cardiovascular: Positive for chest pain. Negative for claudication and near-syncope.  Gastrointestinal: Negative for nausea and vomiting.  Neurological: Negative for weakness and numbness.  All other systems reviewed and are negative.    Allergies  Food; Penicillins; and Sulfamethoxazole w-trimethoprim  Home Medications   Current Outpatient Rx  Name  Route  Sig  Dispense  Refill  . ALBUTEROL SULFATE HFA 108 (90 BASE)  MCG/ACT IN AERS   Inhalation   Inhale 1 puff into the lungs every 6 (six) hours as needed. For wheezing         . ASPIRIN EC 81 MG PO TBEC   Oral   Take 1 tablet (81 mg total) by mouth daily. For platelet aggregation.         Marland Kitchen DIVALPROEX SODIUM 500 MG PO TBEC   Oral   Take 1 tablet (500 mg total) by mouth at bedtime. For bipolar mood disorder.   30 tablet   0   . FLUTICASONE PROPIONATE 50 MCG/ACT NA SUSP   Nasal   Place 2 sprays into the nose daily as needed. For nasal congestion         . FOLIC ACID 1 MG PO TABS   Oral   Take 1 tablet (1 mg total) by mouth daily. For nutritional supplement.         Marland Kitchen  LEVOTHYROXINE SODIUM 150 MCG PO TABS   Oral   Take 1 tablet (150 mcg total) by mouth daily. For thyroid disease.         Marland Kitchen LISINOPRIL 20 MG PO TABS   Oral   Take 1 tablet (20 mg total) by mouth 2 (two) times daily. For hypertension.         . ADULT MULTIVITAMIN W/MINERALS CH   Oral   Take 1 tablet by mouth daily. For nutritional supplement.         Marland Kitchen NITROGLYCERIN 0.4 MG SL SUBL   Sublingual   Place 0.4 mg under the tongue every 5 (five) minutes as needed. For chest pain         . OMEPRAZOLE 20 MG PO CPDR   Oral   Take 1 capsule (20 mg total) by mouth daily.         . OXYCODONE-ACETAMINOPHEN 5-325 MG PO TABS   Oral   Take 1 tablet by mouth every 4 (four) hours as needed for pain.   10 tablet   0   . SIMVASTATIN 20 MG PO TABS   Oral   Take 1 tablet (20 mg total) by mouth at bedtime.   30 tablet   1   . TRAMADOL HCL 50 MG PO TABS   Oral   Take 1 tablet (50 mg total) by mouth every 6 (six) hours as needed for pain. For pain   30 tablet   0     BP 115/72  Pulse 92  Resp 20  Wt 334 lb 8 oz (151.728 kg)  SpO2 97%  Physical Exam  Nursing note and vitals reviewed. Constitutional: She appears well-developed and well-nourished.  HENT:  Head: Normocephalic and atraumatic.  Right Ear: External ear normal.  Eyes: Conjunctivae normal and EOM are normal. Pupils are equal, round, and reactive to light.  Neck: Normal range of motion. Neck supple.  Cardiovascular: Normal rate.   Pulmonary/Chest: Effort normal.  Abdominal: Soft.  Neurological: She is alert.  Skin: Skin is warm.  Psychiatric: She has a normal mood and affect.    ED Course  Procedures (including critical care time)   Labs Reviewed  CBC WITH DIFFERENTIAL  COMPREHENSIVE METABOLIC PANEL  TROPONIN I  URINALYSIS, ROUTINE W REFLEX MICROSCOPIC   No results found.   No diagnosis found.    MDM   Date: 05/30/2012  Rate: 41  Rhythm: normal sinus rhythm  QRS Axis: normal  Intervals:  normal  ST/T Wave abnormalities: normal  Conduction Disutrbances:none  Narrative Interpretation:  Old EKG Reviewed: unchanged   Troponin negative.   I spoke to Dr. Sharyn Lull,  Pt had a normal stress test.  Pt has atypical pain.   Pain has lasted over 24 hours.   I don't think pain is cardiac.   Pt does have a uti.   Pt given cipro po.   Rx for cipro 500mg  bid,  Hydrocodone for pain.  Pt advised to follow up with Dr. Tilman Neat, PA 05/30/12 [address] [place], [place] 05/30/12 1426

## 2012-05-30 NOTE — ED Provider Notes (Signed)
Medical screening examination/treatment/procedure(s) were performed by non-physician practitioner and as supervising physician I was immediately available for consultation/collaboration.  Tobin Chad, MD 05/30/12 [phone]

## 2012-05-31 DIAGNOSIS — M7989 Other specified soft tissue disorders: Secondary | ICD-10-CM

## 2012-05-31 DIAGNOSIS — R0602 Shortness of breath: Secondary | ICD-10-CM

## 2012-05-31 LAB — BASIC METABOLIC PANEL
BUN: 11 mg/dL (ref 6–23)
CO2: 30 mEq/L (ref 19–32)
CO2: 28 mEq/L (ref 19–32)
GFR calc non Af Amer: 22 mL/min — ABNORMAL LOW (ref >90)
GFR calc non Af Amer: 23 mL/min — ABNORMAL LOW (ref >90)
Glucose, Bld: 123 mg/dL — ABNORMAL HIGH (ref 70–99)
Glucose, Bld: 107 mg/dL — ABNORMAL HIGH (ref 70–99)
Potassium: 3.6 mEq/L (ref 3.5–5.1)
Potassium: 3.8 mEq/L (ref 3.5–5.1)
Sodium: 137 mEq/L (ref 135–145)
Sodium: 136 mEq/L (ref 135–145)

## 2012-05-31 LAB — LIPID PANEL
LDL Cholesterol: 73 mg/dL (ref 0–99)
VLDL: 34 mg/dL (ref 0–40)

## 2012-05-31 LAB — GLUCOSE, CAPILLARY
Glucose-Capillary: 93 mg/dL (ref 70–99)
Glucose-Capillary: 101 mg/dL — ABNORMAL HIGH (ref 70–99)

## 2012-05-31 LAB — CK TOTAL AND CKMB (NOT AT ARMC)
CK, MB: 2 ng/mL (ref 0.3–4.0)
CK, MB: 3.6 ng/mL (ref 0.3–4.0)
Relative Index: 1.8 (ref 0.0–2.5)
Relative Index: 2.6 — ABNORMAL HIGH (ref 0.0–2.5)
Relative Index: 2.7 — ABNORMAL HIGH (ref 0.0–2.5)
Total CK: 110 U/L (ref 7–177)

## 2012-05-31 LAB — CREATININE, URINE, RANDOM: Creatinine, Urine: 358.18 mg/dL

## 2012-05-31 MED ORDER — ASPIRIN 81 MG PO CHEW
CHEWABLE_TABLET | ORAL | Status: AC
  Administered 2012-05-31: 81 mg
  Filled 2012-05-31: qty 1

## 2012-05-31 MED ORDER — SODIUM CHLORIDE 0.9 % IV SOLN
INTRAVENOUS | Status: DC
  Administered 2012-05-31: 21:00:00 via INTRAVENOUS
  Administered 2012-06-01: 1000 mL via INTRAVENOUS
  Administered 2012-06-01 – 2012-06-02 (×2): via INTRAVENOUS

## 2012-05-31 MED ORDER — CARVEDILOL 3.125 MG PO TABS
3.1250 mg | ORAL_TABLET | Freq: Two times a day (BID) | ORAL | Status: DC
  Filled 2012-05-31 (×2): qty 1

## 2012-05-31 MED ORDER — ASPIRIN EC 81 MG PO TBEC: 81.0000 mg | DELAYED_RELEASE_TABLET | Freq: Every day | ORAL | Status: DC

## 2012-05-31 MED ORDER — ASPIRIN EC 81 MG PO TBEC
81.0000 mg | DELAYED_RELEASE_TABLET | Freq: Every day | ORAL | Status: DC
  Administered 2012-06-01 – 2012-06-03 (×3): 81 mg via ORAL
  Filled 2012-05-31 (×3): qty 1

## 2012-05-31 NOTE — Progress Notes (Signed)
VASCULAR LAB PRELIMINARY  PRELIMINARY  PRELIMINARY  PRELIMINARY  Bilateral lower extremity venous duplex  completed.    Preliminary report:  Bilateral:  No obvious evidence of DVT, superficial thrombosis, or Baker's Cyst.  Limited by body habitus.    Wilfrid Hyser, RVT 05/31/2012, 2:44 PM

## 2012-05-31 NOTE — Progress Notes (Addendum)
Pt unable urinate today for the whole day, pt bladder scanned but unable to have a good reading secondary to pt weight. Foley catheter inserted as per MD order, very concentrated urine with amber and some sediment draining. Procedure well tolerated by pt. We'll continue with POC.

## 2012-05-31 NOTE — Progress Notes (Signed)
PROGRESS NOTE  [name] JXB:[id number] DOB: 1950/08/11 DOA: 05/30/2012 PCP: No primary provider on file.  Brief narrative: 61 year old female with multiple medical issues presented to the emergency room with chest pain. She identifies Dr. Sharyn Lull as her primary care physician.  She states that the pain is similar to the pain that she was admitted with recently.    Past medical history-As per Problem list Chart reviewed as below- Multiple admissions for what has been deemed noncardiogenic chest pain  Consultants:  Dr. Sharyn Lull  Procedures:  Chest x-ray 05/30/2012 = o congestion  Antibiotics:  None   Subjective  Doing fair. States she still has chest pain. States is in the center of her chest. Wonders if this may be something else that is serious. States that she has "no appetite" however she is eating her entire dessert when I come into the room    Objective    Interim History: Nursing reports no issues  Telemetry: Sinus rhythm sinus tachycardia nonsustained  Objective: Filed Vitals:   05/31/12 0538 05/31/12 0752 05/31/12 1412 05/31/12 1456  BP: 107/61   84/50  Pulse: 60   101  Temp: 98.4 F (36.9 C)   97.9 F (36.6 C)  TempSrc: Oral   Oral  Resp: 20   20  Height:      Weight: 333 lb 12.4 oz (151.4 kg)     SpO2: 93% 99% 94% 100%    Intake/Output Summary (Last 24 hours) at 05/31/12 1711 Last data filed at 05/31/12 1459  Gross per 24 hour  Intake    580 ml  Output      0 ml  Net    580 ml    Exam:  General: Alert morbidly obese Caucasian female no apparent Cardiovascular: S1-S2 no murmur rub or gallop tachycardic Respiratory: Clinically clear Abdomen: Soft nontender Skin no Largent edema Neuro grossly intact  Data Reviewed: Basic Metabolic Panel:  Lab 05/31/12 1308 05/31/12 0524 05/30/12 1204 05/25/12 0515  NA 136 137 135 142  K 3.8 3.6 -- --  CL 96 97 97 104  CO2 28 30 29  34*  GLUCOSE 107* 123* 92 91  BUN 10 11 7  5*  CREATININE  2.24* 2.28* 0.87 0.81  CALCIUM 9.8 9.9 9.9 9.6  MG -- -- -- --  PHOS -- -- -- --   Liver Function Tests:  Lab 05/30/12 1204  AST 34  ALT 25  ALKPHOS 54  BILITOT 0.4  PROT 6.3  ALBUMIN 3.0*   No results found for this basename: LIPASE:5,AMYLASE:5 in the last 168 hours No results found for this basename: AMMONIA:5 in the last 168 hours CBC:  Lab 05/30/12 1204 05/25/12 0515  WBC 4.8 5.4  NEUTROABS 2.4 --  HGB 13.1 12.5  HCT 39.7 40.0  MCV 101.0* 105.8*  PLT 107* 132*   Cardiac Enzymes:  Lab 05/31/12 0915 05/31/12 0524 05/30/12 2341 05/30/12 1753 05/30/12 1205  CKTOTAL 133 110 109 177 --  CKMB 3.6 2.9 2.0 2.1 --  CKMBINDEX -- -- -- -- --  TROPONINI -- <0.30 <0.30 <0.30 <0.30   BNP: No components found with this basename: POCBNP:5 CBG:  Lab 05/31/12 1611 05/31/12 1123  GLUCAP 101* 93    Recent Results (from the past 240 hour(s))  URINE CULTURE     Status: Normal (Preliminary result)   Collection Time   05/30/12 12:06 PM      Component Value Range Status Comment   Specimen Description URINE, CLEAN CATCH   Final  Special Requests NONE   Final    Culture  Setup Time 05/30/2012 13:03   Final    Colony Count >=100,000 COLONIES/ML   Final    Culture ESCHERICHIA COLI   Final    Report Status PENDING   Incomplete      Studies:              All Imaging reviewed and is as per above notation   Scheduled Meds:   . albuterol  2.5 mg Nebulization Q6H  . aspirin EC  325 mg Oral Daily  . cefTRIAXone (ROCEPHIN)  IV  1 g Intravenous Q24H  . divalproex  500 mg Oral QHS  . enoxaparin (LOVENOX) injection  40 mg Subcutaneous Q24H  . fluticasone  2 spray Each Nare Daily  . folic acid  1 mg Oral Daily  . ipratropium  0.5 mg Nebulization Q6H  . levothyroxine  150 mcg Oral Daily  . multivitamin with minerals  1 tablet Oral Daily  . pantoprazole  40 mg Oral Daily  . simvastatin  20 mg Oral QHS  . sodium chloride  3 mL Intravenous Q12H   Continuous Infusions:     Assessment/Plan: 1. Chest pain in a setting of CAD with negative left cardiac catheterization 2007-Unlikely cardiogenic. I have personally taken care of this patient multiple times in the hospital.  She was recently discharged 05/23/2012 by Dr.Kadakia-she could not finish uterus stress due to weight limitations-she also had a CT angiogram at that emergency room visit which did not show pulmonary embolism. And left possibility that this is Tietze syndrome versus reflux-last echo 03/28/2012 showed an EF of 50-50% with no wall motion abnormality. Continue aspirin 2. Grade 1 diastolic dysfunction plus EF 50-55% mild LVH, 03/28/2012-patient not on Lasix hydralazine or beta blocker. ?? add low-dose beta blocker Coreg 3.125 for tachycardia-will consider ACE inhibitor as an outpatient-relative contraindication presently given elevated creatinine 3. Acute kidney injury-unclear etiology. Her creatinine bumped to 2.28-this was repeated and it went to 2.24. I will base on IV fluids at 75 cc an hour and reassess in the morning. We may consider nephrology consultation if this does not improve. 4. Morbid obesity, Body mass index is 61.05 kg/(m^2).-needs dietary counseling. Was supposed to follow bariatrics after my last visit with her in July 2013 5. History of atrial fibrillation in the past-Chad score 1-2. Currently sinus tachycardia only. See above 6. Schizoaffective disorder-continue depakote 500 daily.  Would start on trazadone again on d/c home  Code Status: Full Family Communication: Brief discussion with husband at bedside Disposition Plan: Inpatient-upgraded from OBs   Pleas Koch, MD  [facility] Pager [phone] 05/31/2012, 5:11 PM    LOS: 1 day

## 2012-05-31 NOTE — Progress Notes (Signed)
Pt unable to void and not eating much, been mostly sleep today.  Dr. Mahala Menghini made aware.  Offered pt bedpan and bedside commode.  Pt stated that she does not want to void.  Oncoming nurse is aware and will insert foley as ordered.  Will continue to monitor.  Amanda Pea, Charity fundraiser.

## 2012-05-31 NOTE — Progress Notes (Signed)
Subjective:  Complains of musculoskeletal chest pain and generalized the body pain. Also complains of dysuria. No anginal chest pain cardiac enzymes negative EKG no acute ischemic changes  Objective:  Vital Signs in the last 24 hours: Temp:  [97.9 F (36.6 C)-98.4 F (36.9 C)] 98.4 F (36.9 C) (12/12 0538) Pulse Rate:  [60-117] 60  (12/12 0538) Resp:  [17-23] 20  (12/12 0538) BP: (95-122)/(60-72) 107/61 mmHg (12/12 0538) SpO2:  [93 %-99 %] 99 % (12/12 0752) FiO2 (%):  [32 %] 32 % (12/12 0314) Weight:  [151.4 kg (333 lb 12.4 oz)-151.864 kg (334 lb 12.8 oz)] 151.4 kg (333 lb 12.4 oz) (12/12 0538)  Intake/Output from previous day:   Intake/Output from this shift:    Physical Exam: Neck: no adenopathy, no carotid bruit, no JVD and supple, symmetrical, trachea midline Lungs: Decreased breath sound at bases Heart: regular rate and rhythm, S1, S2 normal, no murmur, click, rub or gallop Abdomen: Soft bowel sounds present distended nontender Extremities: extremities normal, atraumatic, no cyanosis or edema  Lab Results:  Basename 05/30/12 1204  WBC 4.8  HGB 13.1  PLT 107*    Basename 05/31/12 0524 05/30/12 1204  NA 137 135  K 3.6 3.6  CL 97 97  CO2 30 29  GLUCOSE 123* 92  BUN 11 7  CREATININE 2.28* 0.87    Basename 05/31/12 0524 05/30/12 2341  TROPONINI <0.30 <0.30   Hepatic Function Panel  Basename 05/30/12 1204  PROT 6.3  ALBUMIN 3.0*  AST 34  ALT 25  ALKPHOS 54  BILITOT 0.4  BILIDIR --  IBILI --    Basename 05/31/12 0524  CHOL 159   No results found for this basename: PROTIME in the last 72 hours  Imaging: Imaging results have been reviewed and Dg Chest Port 1 View  05/30/2012  *RADIOLOGY REPORT*  Clinical Data: Pain  PORTABLE CHEST - 1 VIEW  Comparison: 05/23/2012  Findings: Hypoventilation with decreased lung volume and crowded markings in the bases compatible with atelectasis.  Cardiac enlargement with vascular congestion.  No edema or effusion.   IMPRESSION: Hypoventilation with mild bibasilar atelectasis.  Pulmonary vascular congestion without edema.   Original Report Authenticated By: Janeece Riggers, M.D.     Cardiac Studies:  Assessment/Plan:  Atypical chest pain with negative nuclear stress test in the recent past MI ruled out Mild coronary artery disease  Hypertension  History of paroxysmal atrial flutter  COPD  Obstructive sleep apnea/obesity hypoventilation syndrome  Hypothyroidism  Tobacco abuse  Morbid obesity  Hiatus hernia  Seizure disorder Acute renal insufficiency etiology unclear Plan Recheck renal function Discussed with patient regarding diet lifestyle modification weight reduction states has lost 17 pounds in the last 3 months and working on it. Consider diet reconsult No further cardiac workup needed I will sign off please call if needed followup with me in 2 weeks/when necessary  LOS: 1 day    Sundy Houchins N 05/31/2012, 8:31 AM

## 2012-05-31 NOTE — Progress Notes (Signed)
INITIAL NUTRITION ASSESSMENT  DOCUMENTATION CODES Per approved criteria  -Morbid Obesity    INTERVENTION: 1. Encourage po intake of meals 2. RD will continue to follow    NUTRITION DIAGNOSIS: Unintentional weight loss related to poor appetite as evidenced by weight trends.   Goal: PO intake to meet >/=90% estimated nutrition needs  Monitor:  PO intake, weight, labs  Reason for Assessment: MST (Malnutrition Screening Tool)   61 y.o. female  Admitting Dx: Chest pain  ASSESSMENT: Pt admitted with SOB.  Reports weight loss of 18 lbs in the past several weeks, pt states she has no appetite. Difficult to obtain information from pt, very sleepy and difficult to keep on topic. Weight hx shows highly variable weights, question if weight changes are related to fluid status?  Given pt's BMI, some weight loss is appropriate for this pt.  Pt is at increased risk for further skin breakdown with out adequate nutrition.   Height: Ht Readings from Last 1 Encounters:  05/30/12 5\' 2"  (1.575 m)   Weight: Wt Readings from Last 1 Encounters:  05/31/12 333 lb 12.4 oz (151.4 kg)   Ideal Body Weight: 50 kg   % Ideal Body Weight: 302%  Wt Readings from Last 10 Encounters:  05/31/12 333 lb 12.4 oz (151.4 kg)  05/25/12 350 lb 14.4 oz (159.167 kg)  03/29/12 341 lb 11.4 oz (155 kg)  01/27/12 360 lb (163.295 kg)  01/19/12 360 lb (163.295 kg)  01/12/12 332 lb 10.8 oz (150.9 kg)  12/28/11 345 lb 3.9 oz (156.6 kg)  11/22/11 324 lb (146.965 kg)  09/25/11 329 lb (149.233 kg)  09/12/11 309 lb (140.161 kg)   Usual Body Weight: ? Weight on file is highly variable   % Usual Body Weight: ??   BMI:  Body mass index is 61.05 kg/(m^2). Class 3 obesity  Estimated Nutritional Needs: Kcal: 2200-2400 Protein: 75-90 gm  Fluid: 2.2-2.4 L   Skin: wounds noted, stage one, abrasion, open ares on bottom per RN flowsheet.   Diet Order: Cardiac  EDUCATION NEEDS: -Education not appropriate at this  time   Intake/Output Summary (Last 24 hours) at 05/31/12 1529 Last data filed at 05/31/12 1459  Gross per 24 hour  Intake    480 ml  Output      0 ml  Net    480 ml   Last BM: none documented    Labs:   Lab 05/31/12 0915 05/31/12 0524 05/30/12 1204  NA 136 137 135  K 3.8 3.6 3.6  CL 96 97 97  CO2 28 30 29   BUN 10 11 7   CREATININE 2.24* 2.28* 0.87  CALCIUM 9.8 9.9 9.9  MG -- -- --  PHOS -- -- --  GLUCOSE 107* 123* 92    CBG (last 3)   Basename 05/31/12 1123  GLUCAP 93    Scheduled Meds:   . albuterol  2.5 mg Nebulization Q6H  . aspirin EC  325 mg Oral Daily  . cefTRIAXone (ROCEPHIN)  IV  1 g Intravenous Q24H  . divalproex  500 mg Oral QHS  . enoxaparin (LOVENOX) injection  40 mg Subcutaneous Q24H  . fluticasone  2 spray Each Nare Daily  . folic acid  1 mg Oral Daily  . [COMPLETED] gi cocktail  30 mL Oral Once  . ipratropium  0.5 mg Nebulization Q6H  . levothyroxine  150 mcg Oral Daily  . multivitamin with minerals  1 tablet Oral Daily  . pantoprazole  40 mg Oral Daily  .  simvastatin  20 mg Oral QHS  . sodium chloride  3 mL Intravenous Q12H  . [DISCONTINUED] cefTRIAXone (ROCEPHIN)  IV  1 g Intravenous Q24H  . [DISCONTINUED] lisinopril  20 mg Oral Daily   Continuous Infusions:   Past Medical History  Diagnosis Date  . Schizoaffective disorder   . Urinary tract infection   . Hypertension   . GERD (gastroesophageal reflux disease)   . Hypothyroidism   . Obesity   . Asthma   . COPD (chronic obstructive pulmonary disease)   . CHF (congestive heart failure)   . Seizures   . Morbid obesity   . Chronic pain   . Hypercholesteremia   . Anginal pain   . Myocardial infarction 2002  . Pneumonia     "several times" (05/23/2012)  . Diabetes mellitus     "I'm not diabetic anymore" (05/23/2012)  . Anemia   . History of blood transfusion 1993    "when I had my hysterectomy" (05/23/2012)  . H/O hiatal hernia   . Daily headache   . Arthritis     "severe; all  over my body" (05/23/2012)  . Hypoventilation syndrome     [name] 05/30/2012  . Atrial fibrillation   . Atrial flutter, paroxysmal     [name] 05/30/2012  . Exertional dyspnea   . Shortness of breath     "all the time lately" (05/30/2012)    Past Surgical History  Procedure Date  . Vaginal hysterectomy 1993  . Tubal ligation 1998  . Cholecystectomy ?2008  . Cardiac catheterization 2008      Clarene Duke RD, [place] Pager [phone] After Hours pager [phone]

## 2012-05-31 NOTE — Progress Notes (Signed)
Patient was half awake but appeared to be exhausted. Chaplain empathically listened to patient and provided ministry of presence. Chaplain will continue to follow-up as needed. Patient thanked Chaplain for the visit and spiritual support.

## 2012-06-01 LAB — RENAL FUNCTION PANEL
BUN: 17 mg/dL (ref 6–23)
CO2: 30 mEq/L (ref 19–32)
Calcium: 9.8 mg/dL (ref 8.4–10.5)
GFR calc Af Amer: 19 mL/min — ABNORMAL LOW (ref >90)
Glucose, Bld: 103 mg/dL — ABNORMAL HIGH (ref 70–99)
Phosphorus: 4 mg/dL (ref 2.3–4.6)
Potassium: 4.2 mEq/L (ref 3.5–5.1)
Sodium: 136 mEq/L (ref 135–145)

## 2012-06-01 LAB — CBC
HCT: 41.3 % (ref 36.0–46.0)
Hemoglobin: 12.7 g/dL (ref 12.0–15.0)
RBC: 3.89 MIL/uL (ref 3.87–5.11)

## 2012-06-01 LAB — CBC WITH DIFFERENTIAL/PLATELET
Basophils Absolute: 0 10*3/uL (ref 0.0–0.1)
Basophils Relative: 0 % (ref 0–1)
Eosinophils Absolute: 0.1 10*3/uL (ref 0.0–0.7)
Eosinophils Relative: 1 % (ref 0–5)
HCT: 38.5 % (ref 36.0–46.0)
Hemoglobin: 11.9 g/dL — ABNORMAL LOW (ref 12.0–15.0)
Lymphocytes Relative: 21 % (ref 12–46)
Lymphs Abs: 1.5 10*3/uL (ref 0.7–4.0)
MCH: 32.8 pg (ref 26.0–34.0)
MCHC: 30.9 g/dL (ref 30.0–36.0)
MCV: 106.1 fL — ABNORMAL HIGH (ref 78.0–100.0)
Monocytes Absolute: 1.6 10*3/uL — ABNORMAL HIGH (ref 0.1–1.0)
Monocytes Relative: 23 % — ABNORMAL HIGH (ref 3–12)
Neutro Abs: 3.9 10*3/uL (ref 1.7–7.7)
Neutrophils Relative %: 55 % (ref 43–77)
Platelets: 109 10*3/uL — ABNORMAL LOW (ref 150–400)
RBC: 3.63 MIL/uL — ABNORMAL LOW (ref 3.87–5.11)
RDW: 15.5 % (ref 11.5–15.5)
WBC: 7.1 10*3/uL (ref 4.0–10.5)

## 2012-06-01 LAB — BASIC METABOLIC PANEL
BUN: 15 mg/dL (ref 6–23)
Chloride: 97 mEq/L (ref 96–112)
GFR calc Af Amer: 17 mL/min — ABNORMAL LOW (ref >90)
GFR calc non Af Amer: 15 mL/min — ABNORMAL LOW (ref >90)
Potassium: 6.1 mEq/L — ABNORMAL HIGH (ref 3.5–5.1)
Sodium: 135 mEq/L (ref 135–145)

## 2012-06-01 LAB — DRUGS OF ABUSE SCREEN W/O ALC, ROUTINE URINE
Benzodiazepines.: NEGATIVE
Cocaine Metabolites: NEGATIVE
Methadone: NEGATIVE
Opiate Screen, Urine: POSITIVE — AB

## 2012-06-01 LAB — URINE CULTURE: Colony Count: >=100000

## 2012-06-01 LAB — GLUCOSE, CAPILLARY
Glucose-Capillary: 110 mg/dL — ABNORMAL HIGH (ref 70–99)
Glucose-Capillary: 102 mg/dL — ABNORMAL HIGH (ref 70–99)

## 2012-06-01 LAB — CK TOTAL AND CKMB (NOT AT ARMC)
Relative Index: 2.2 (ref 0.0–2.5)
Total CK: 138 U/L (ref 7–177)

## 2012-06-01 MED ORDER — QUETIAPINE FUMARATE ER 300 MG PO TB24
300.0000 mg | ORAL_TABLET | Freq: Every day | ORAL | Status: DC
  Administered 2012-06-01 – 2012-06-02 (×2): 300 mg via ORAL
  Filled 2012-06-01 (×4): qty 1

## 2012-06-01 MED ORDER — INSULIN ASPART 100 UNIT/ML ~~LOC~~ SOLN
0.0000 [IU] | Freq: Three times a day (TID) | SUBCUTANEOUS | Status: DC
  Administered 2012-06-02: 1 [IU] via SUBCUTANEOUS

## 2012-06-01 MED ORDER — SODIUM POLYSTYRENE SULFONATE 15 GM/60ML PO SUSP
30.0000 g | Freq: Two times a day (BID) | ORAL | Status: DC
  Administered 2012-06-01: 30 g via ORAL
  Filled 2012-06-01 (×2): qty 120

## 2012-06-01 NOTE — Progress Notes (Signed)
Dr. Sharyn Lull is the patient's primary care physician and sees her in the outpatient setting and will be seeing her from tomorrow on per my discussion c him  Pleas Koch, MD [facility] (P) [phone]

## 2012-06-01 NOTE — Progress Notes (Signed)
Pt's HR up to 154 and asymptomatic and  BP 94/50, P 60.  Pt up in chair no acute distress noted.  Dr. Mahala Menghini notified.  No new order.  Will continue to monitor.  Amanda Pea, Charity fundraiser.

## 2012-06-01 NOTE — Progress Notes (Addendum)
HPI:  61 year old female with a history of hypertension, COPD, schizoaffective disorder, paroxysmal atrial fibrillation, and hypothyroidism presents with c/o chest pain yesterday. The patient has had numerous hospital admissions for chest pain complaints.   The new event was a low BP.  She had BP yesterday down to 84/50.  Serum creatinine on 12/11 was 0.87mg /dl and on 16/10 cr was 9.60 and 3.16 today with a K of 6.33meq/l in a hemolyzed specimen.  She has been on Lisinopril 20mg  BID  PTA.  We are asked to see to assist with management.   Past Medical History  Diagnosis Date  . Schizoaffective disorder   . Urinary tract infection   . Hypertension   . GERD (gastroesophageal reflux disease)   . Hypothyroidism   . Obesity   . Asthma   . COPD (chronic obstructive pulmonary disease)   . CHF (congestive heart failure)   . Seizures   . Morbid obesity   . Chronic pain   . Hypercholesteremia   . Anginal pain   . Myocardial infarction 2002  . Pneumonia     "several times" (05/23/2012)  . Diabetes mellitus     "I'm not diabetic anymore" (05/23/2012)  . Anemia   . History of blood transfusion 1993    "when I had my hysterectomy" (05/23/2012)  . H/O hiatal hernia   . Daily headache   . Arthritis     "severe; all over my body" (05/23/2012)  . Hypoventilation syndrome     [name] 05/30/2012  . Atrial fibrillation   . Atrial flutter, paroxysmal     [name] 05/30/2012  . Exertional dyspnea   . Shortness of breath     "all the time lately" (05/30/2012)   Past Surgical History  Procedure Date  . Vaginal hysterectomy 1993  . Tubal ligation 1998  . Cholecystectomy ?2008  . Cardiac catheterization 2008   Social History:  reports that she quit smoking about 11 years ago. Her smoking use included Cigarettes. She has a 24 pack-year smoking history. Her smokeless tobacco use includes Snuff and Chew. She reports that she does not drink alcohol or use illicit drugs. Allergies:  Allergies  Allergen  Reactions  . Food Rash    "lasagna"= rash from the noodles  . Penicillins Hives  . Sulfamethoxazole W-Trimethoprim Hives and Nausea And Vomiting   Family History  Problem Relation Age of Onset  . Heart disease Father   . Cancer Mother 15    Breast   Medications:  Scheduled:   . albuterol  2.5 mg Nebulization Q6H  . aspirin EC  81 mg Oral Daily  . cefTRIAXone (ROCEPHIN)  IV  1 g Intravenous Q24H  . divalproex  500 mg Oral QHS  . enoxaparin (LOVENOX) injection  40 mg Subcutaneous Q24H  . fluticasone  2 spray Each Nare Daily  . folic acid  1 mg Oral Daily  . insulin aspart  0-9 Units Subcutaneous TID WC  . ipratropium  0.5 mg Nebulization Q6H  . levothyroxine  150 mcg Oral Daily  . multivitamin with minerals  1 tablet Oral Daily  . pantoprazole  40 mg Oral Daily  . simvastatin  20 mg Oral QHS  . sodium chloride  3 mL Intravenous Q12H   ROS: unobtainable Blood pressure 123/50, pulse 108, temperature 99.2 F (37.3 C), temperature source Axillary, resp. rate 18, height 5\' 2"  (1.575 m), weight 156.128 kg (344 lb 3.2 oz), SpO2 93.00%.  General appearance: distracted and difficult to understand  Morbidly obese Head:  Normocephalic, without obvious abnormality, atraumatic Eyes: negative Nose: no discharge Throat: normal findings: lips normal without lesions Resp: clear to auscultation bilaterally Chest wall: no tenderness Cardio: regular rate and rhythm, S1, S2 normal, no murmur, click, rub or gallop GI: soft, non-tender; bowel sounds normal; no masses,  no organomegaly Extremities: edema 1+ Skin: Skin color, texture, turgor normal. No rashes or lesions Neurologic: talkative and difficult to understand, bizarre affect  Results for orders placed during the hospital encounter of 05/30/12 (from the past 48 hour(s))  CK TOTAL AND CKMB     Status: Normal   Collection Time   05/30/12  5:53 PM      Component Value Range Comment   Total CK 177  7 - 177 U/L HEMOLYSIS AT THIS LEVEL MAY  AFFECT RESULT   CK, MB 2.1  0.3 - 4.0 ng/mL    Relative Index 1.2  0.0 - 2.5   TROPONIN I     Status: Normal   Collection Time   05/30/12  5:53 PM      Component Value Range Comment   Troponin I <0.30  <0.30 ng/mL   TSH     Status: Abnormal   Collection Time   05/30/12  7:31 PM      Component Value Range Comment   TSH 5.562 (*) 0.350 - 4.500 uIU/mL   CK TOTAL AND CKMB     Status: Normal   Collection Time   05/30/12 11:41 PM      Component Value Range Comment   Total CK 109  7 - 177 U/L    CK, MB 2.0  0.3 - 4.0 ng/mL    Relative Index 1.8  0.0 - 2.5   TROPONIN I     Status: Normal   Collection Time   05/30/12 11:41 PM      Component Value Range Comment   Troponin I <0.30  <0.30 ng/mL   CK TOTAL AND CKMB     Status: Abnormal   Collection Time   05/31/12  5:24 AM      Component Value Range Comment   Total CK 110  7 - 177 U/L    CK, MB 2.9  0.3 - 4.0 ng/mL    Relative Index 2.6 (*) 0.0 - 2.5   TROPONIN I     Status: Normal   Collection Time   05/31/12  5:24 AM      Component Value Range Comment   Troponin I <0.30  <0.30 ng/mL   BASIC METABOLIC PANEL     Status: Abnormal   Collection Time   05/31/12  5:24 AM      Component Value Range Comment   Sodium 137  135 - 145 mEq/L    Potassium 3.6  3.5 - 5.1 mEq/L    Chloride 97  96 - 112 mEq/L    CO2 30  19 - 32 mEq/L    Glucose, Bld 123 (*) 70 - 99 mg/dL    BUN 11  6 - 23 mg/dL    Creatinine, Ser 4.09 (*) 0.50 - 1.10 mg/dL DELTA CHECK NOTED   Calcium 9.9  8.4 - 10.5 mg/dL    GFR calc non Af Amer 22 (*) >90 mL/min    GFR calc Af Amer 26 (*) >90 mL/min   LIPID PANEL     Status: Abnormal   Collection Time   05/31/12  5:24 AM      Component Value Range Comment   Cholesterol 159  0 - 200 mg/dL  Triglycerides 168 (*) <150 mg/dL    HDL 52  >16 mg/dL    Total CHOL/HDL Ratio 3.1      VLDL 34  0 - 40 mg/dL    LDL Cholesterol 73  0 - 99 mg/dL   CK TOTAL AND CKMB     Status: Abnormal   Collection Time   05/31/12  9:15 AM       Component Value Range Comment   Total CK 133  7 - 177 U/L    CK, MB 3.6  0.3 - 4.0 ng/mL    Relative Index 2.7 (*) 0.0 - 2.5   BASIC METABOLIC PANEL     Status: Abnormal   Collection Time   05/31/12  9:15 AM      Component Value Range Comment   Sodium 136  135 - 145 mEq/L    Potassium 3.8  3.5 - 5.1 mEq/L    Chloride 96  96 - 112 mEq/L    CO2 28  19 - 32 mEq/L    Glucose, Bld 107 (*) 70 - 99 mg/dL    BUN 10  6 - 23 mg/dL    Creatinine, Ser 1.09 (*) 0.50 - 1.10 mg/dL    Calcium 9.8  8.4 - 60.4 mg/dL    GFR calc non Af Amer 23 (*) >90 mL/min    GFR calc Af Amer 26 (*) >90 mL/min   GLUCOSE, CAPILLARY     Status: Normal   Collection Time   05/31/12 11:23 AM      Component Value Range Comment   Glucose-Capillary 93  70 - 99 mg/dL   GLUCOSE, CAPILLARY     Status: Abnormal   Collection Time   05/31/12  4:11 PM      Component Value Range Comment   Glucose-Capillary 101 (*) 70 - 99 mg/dL    Comment 1 Notify RN     CK TOTAL AND CKMB     Status: Normal   Collection Time   05/31/12  6:13 PM      Component Value Range Comment   Total CK 126  7 - 177 U/L    CK, MB 3.1  0.3 - 4.0 ng/mL    Relative Index 2.5  0.0 - 2.5   DRUGS OF ABUSE SCREEN W/O ALC, ROUTINE URINE     Status: Abnormal   Collection Time   05/31/12  8:47 PM      Component Value Range Comment   Marijuana Metabolite NEGATIVE  Negative    Amphetamine Screen, Ur NEGATIVE  Negative    Barbiturate Quant, Ur NEGATIVE  Negative    Methadone NEGATIVE  Negative    Benzodiazepines. NEGATIVE  Negative    Phencyclidine (PCP) NEGATIVE  Negative    Cocaine Metabolites NEGATIVE  Negative    Opiate Screen, Urine POSITIVE (*) Negative    Propoxyphene NEGATIVE  Negative    Creatinine,U 432.9     OSMOLALITY, URINE     Status: Abnormal   Collection Time   05/31/12  8:47 PM      Component Value Range Comment   Osmolality, Ur 308 (*) 390 - 1090 mOsm/kg   SODIUM, URINE, RANDOM     Status: Normal   Collection Time   05/31/12  8:47 PM       Component Value Range Comment   Sodium, Ur 46     CREATININE, URINE, RANDOM     Status: Normal   Collection Time   05/31/12  8:47 PM  Component Value Range Comment   Creatinine, Urine 358.18     GLUCOSE, CAPILLARY     Status: Abnormal   Collection Time   05/31/12  9:05 PM      Component Value Range Comment   Glucose-Capillary 168 (*) 70 - 99 mg/dL   CK TOTAL AND CKMB     Status: Normal   Collection Time   05/31/12 11:06 PM      Component Value Range Comment   Total CK 138  7 - 177 U/L    CK, MB 3.0  0.3 - 4.0 ng/mL    Relative Index 2.2  0.0 - 2.5   GLUCOSE, CAPILLARY     Status: Normal   Collection Time   06/01/12  5:37 AM      Component Value Range Comment   Glucose-Capillary 91  70 - 99 mg/dL   BASIC METABOLIC PANEL     Status: Abnormal   Collection Time   06/01/12  7:30 AM      Component Value Range Comment   Sodium 135  135 - 145 mEq/L    Potassium 6.1 (*) 3.5 - 5.1 mEq/L HEMOLYZED SPECIMEN, RESULTS MAY BE AFFECTED   Chloride 97  96 - 112 mEq/L    CO2 30  19 - 32 mEq/L    Glucose, Bld 104 (*) 70 - 99 mg/dL    BUN 15  6 - 23 mg/dL    Creatinine, Ser 4.54 (*) 0.50 - 1.10 mg/dL    Calcium 9.4  8.4 - 09.8 mg/dL    GFR calc non Af Amer 15 (*) >90 mL/min    GFR calc Af Amer 17 (*) >90 mL/min   CBC WITH DIFFERENTIAL     Status: Abnormal   Collection Time   06/01/12  7:30 AM      Component Value Range Comment   WBC 7.1  4.0 - 10.5 K/uL    RBC 3.63 (*) 3.87 - 5.11 MIL/uL    Hemoglobin 11.9 (*) 12.0 - 15.0 g/dL    HCT 11.9  14.7 - 82.9 %    MCV 106.1 (*) 78.0 - 100.0 fL    MCH 32.8  26.0 - 34.0 pg    MCHC 30.9  30.0 - 36.0 g/dL    RDW 56.2  13.0 - 86.5 %    Platelets 109 (*) 150 - 400 K/uL CONSISTENT WITH PREVIOUS RESULT   Neutrophils Relative 55  43 - 77 %    Neutro Abs 3.9  1.7 - 7.7 K/uL    Lymphocytes Relative 21  12 - 46 %    Lymphs Abs 1.5  0.7 - 4.0 K/uL    Monocytes Relative 23 (*) 3 - 12 %    Monocytes Absolute 1.6 (*) 0.1 - 1.0 K/uL     Eosinophils Relative 1  0 - 5 %    Eosinophils Absolute 0.1  0.0 - 0.7 K/uL    Basophils Relative 0  0 - 1 %    Basophils Absolute 0.0  0.0 - 0.1 K/uL   TROPONIN I     Status: Normal   Collection Time   06/01/12  7:30 AM      Component Value Range Comment   Troponin I <0.30  <0.30 ng/mL   GLUCOSE, CAPILLARY     Status: Abnormal   Collection Time   06/01/12 11:27 AM      Component Value Range Comment   Glucose-Capillary 110 (*) 70 - 99 mg/dL    Dg Chest [facility]  1 View  05/30/2012  *RADIOLOGY REPORT*  Clinical Data: Pain  PORTABLE CHEST - 1 VIEW  Comparison: 05/23/2012  Findings: Hypoventilation with decreased lung volume and crowded markings in the bases compatible with atelectasis.  Cardiac enlargement with vascular congestion.  No edema or effusion.  IMPRESSION: Hypoventilation with mild bibasilar atelectasis.  Pulmonary vascular congestion without edema.   Original Report Authenticated By: Janeece Riggers, M.D.    Assessment:  1 Acute Kidney Injury probably hemodynamically mediated due to hypotension in presence of ACE-I Rx. 2 Hypotension ? Etiology r/o GI bleed, cardiovascular, PE  Plan: 1 Agree with IVF to optimize hemodynamics, increase rat to 125cc/hr, recheck CBC  Anabela Crayton C   Orit Sanville C 06/01/2012, 2:03 PM

## 2012-06-01 NOTE — Progress Notes (Signed)
Pt no having good out put, only 170 cc out for the night shift. Pt on continues IV fluids running at 75 cc/hr and foley catheter in place. Pt gain 5 kg from last weight yesterday morning. We'll notified MD and continue to monitor.

## 2012-06-01 NOTE — Progress Notes (Signed)
PROGRESS NOTE  [name] RUE:[id number] DOB: [birth year] DOA: 05/30/2012 PCP: No primary provider on file.  Brief narrative: 61 year old female with multiple medical issues presented to the emergency room with chest pain. She identifies Dr. Sharyn Lull as her primary care physician.  She states that the pain is similar to the pain that she was admitted with recently. Patient seen in consult by cardiology on admission and it was noted that they thought this is noncardiogenic in nature. A subsequent admission it was noted that her creatinine had more than doubled with an unknown etiology and hence nephrology was consulted   Past medical history-As per Problem list Chart reviewed as below- Multiple admissions for what has been deemed noncardiogenic chest pain  Consultants:  Dr. Sharyn Lull  Procedures:  Chest x-ray 05/30/2012 = o congestion  Antibiotics:  None   Subjective  Doing fair. Tolerating by mouth better today with no acute issues Urine output seemed to improve a little bit. She has not passed stool in several days. She states that she occasionally feels short of breath.   Objective    Interim History: Nursing reports mild orthostasis this afternoon.  Telemetry: Sinus rhythm sinus tachycardia nonsustained  Objective: Filed Vitals:   05/31/12 2110 06/01/12 0451 06/01/12 0917 06/01/12 1414  BP:  123/50  93/40  Pulse:  108  108  Temp:  99.2 F (37.3 C)  98.4 F (36.9 C)  TempSrc:  Axillary  Oral  Resp:  18  20  Height:      Weight:  344 lb 3.2 oz (156.128 kg)    SpO2: 99% 99% 93% 93%    Intake/Output Summary (Last 24 hours) at 06/01/12 1555 Last data filed at 06/01/12 1414  Gross per 24 hour  Intake    823 ml  Output    390 ml  Net    433 ml    Exam:  General: Alert morbidly obese Caucasian female no apparent distress currently Cardiovascular: S1-S2 no murmur rub or gallop tachycardic Respiratory: Clinically clear Abdomen: Soft nontender Skin no  Largent edema Neuro grossly intact  Data Reviewed: Basic Metabolic Panel:  Lab 06/01/12 1478 05/31/12 0915 05/31/12 0524 05/30/12 1204  NA 135 136 137 135  K 6.1* 3.8 -- --  CL 97 96 97 97  CO2 30 28 30 29   GLUCOSE 104* 107* 123* 92  BUN 15 10 11 7   CREATININE 3.16* 2.24* 2.28* 0.87  CALCIUM 9.4 9.8 9.9 9.9  MG -- -- -- --  PHOS -- -- -- --   Liver Function Tests:  Lab 05/30/12 1204  AST 34  ALT 25  ALKPHOS 54  BILITOT 0.4  PROT 6.3  ALBUMIN 3.0*   No results found for this basename: LIPASE:5,AMYLASE:5 in the last 168 hours No results found for this basename: AMMONIA:5 in the last 168 hours CBC:  Lab 06/01/12 0730 05/30/12 1204  WBC 7.1 4.8  NEUTROABS 3.9 2.4  HGB 11.9* 13.1  HCT 38.5 39.7  MCV 106.1* 101.0*  PLT 109* 107*   Cardiac Enzymes:  Lab 06/01/12 0730 05/31/12 2306 05/31/12 1813 05/31/12 0915 05/31/12 0524 05/30/12 2341 05/30/12 1753 05/30/12 1205  CKTOTAL -- 138 126 133 110 109 -- --  CKMB -- 3.0 3.1 3.6 2.9 2.0 -- --  CKMBINDEX -- -- -- -- -- -- -- --  TROPONINI <0.30 -- -- -- <0.30 <0.30 <0.30 <0.30   BNP: No components found with this basename: POCBNP:5 CBG:  Lab 06/01/12 1127 06/01/12 0537 05/31/12 2105 05/31/12 1611 05/31/12  1123  GLUCAP 110* 91 168* 101* 93    Recent Results (from the past 240 hour(s))  URINE CULTURE     Status: Normal   Collection Time   05/30/12 12:06 PM      Component Value Range Status Comment   Specimen Description URINE, CLEAN CATCH   Final    Special Requests NONE   Final    Culture  Setup Time 05/30/2012 13:03   Final    Colony Count >=100,000 COLONIES/ML   Final    Culture ESCHERICHIA COLI   Final    Report Status 06/01/2012 FINAL   Final    Organism ID, Bacteria ESCHERICHIA COLI   Final      Studies:              All Imaging reviewed and is as per above notation   Scheduled Meds:    . albuterol  2.5 mg Nebulization Q6H  . aspirin EC  81 mg Oral Daily  . cefTRIAXone (ROCEPHIN)  IV  1 g Intravenous  Q24H  . divalproex  500 mg Oral QHS  . enoxaparin (LOVENOX) injection  40 mg Subcutaneous Q24H  . fluticasone  2 spray Each Nare Daily  . folic acid  1 mg Oral Daily  . insulin aspart  0-9 Units Subcutaneous TID WC  . ipratropium  0.5 mg Nebulization Q6H  . levothyroxine  150 mcg Oral Daily  . multivitamin with minerals  1 tablet Oral Daily  . pantoprazole  40 mg Oral Daily  . simvastatin  20 mg Oral QHS  . sodium chloride  3 mL Intravenous Q12H   Continuous Infusions:    . sodium chloride 75 mL/hr at 06/01/12 1348     Assessment/Plan: 1. Chest pain in a setting of CAD with negative left cardiac catheterization 2007-Unlikely cardiogenic. I have personally taken care of this patient multiple times in the hospital.  She was recently discharged 05/23/2012 by Dr.Kadakia-she could not finish uterus stress due to weight limitations-she also had a CT angiogram at that emergency room visit which did not show pulmonary embolism. And left possibility that this is Tietze syndrome versus reflux-last echo 03/28/2012 showed an EF of 50-50% with no wall motion abnormality. Continue aspirin 2. Asymptomatic bacteriuria with Escherichia coli-her urine was collected in a cup. She is currently afebrile and I will discontinue her Rocephin. 3. ? Obstructive sleep apnea/pickwickian syndrome-patient doesn't has the habitus for this. Would recommend outpatient sleep study 4. Grade 1 diastolic dysfunction plus EF 50-55% mild LVH, 03/28/2012-patient not on Lasix hydralazine or beta blocker. ?? add low-dose beta blocker Coreg 3.125 for tachycardia-will consider ACE inhibitor as an outpatient-relative contraindication presently given elevated creatinine 5. Acute kidney injury-unclear etiology. Her creatinine bumped to 2.28-this was repeated and it went to 2.24.appreciate nephrology input-they recommend increasing fluids 100 cc an hour-urine osmolality was slightly low, calculated FENA=0.2%, likely pre-renal-was  hemolyzed this morning and will be repeated. She was given one dose of Kayexalate which has been discontinued 6. Morbid obesity, Body mass index is 62.95 kg/(m^2).-needs dietary counseling. Was supposed to follow bariatrics. 7. History of atrial fibrillation in the past-Chad score 1-2. Currently sinus tachycardia only. See above 8. Schizoaffective disorder-continue depakote 500 daily.  Would start on trazadone again on d/c home?I have formally consulted psychiatry 06/01/2012 to see her once again  Code Status: Full Family Communication: none at bedside today Disposition Plan: Inpatient-upgraded from OBs   Pleas Koch, MD  [facility] Pager [phone] 06/01/2012, 3:55 PM  LOS: 2 days

## 2012-06-01 NOTE — Consult Note (Signed)
Reason for Consult: Depression and psychosis Referring Physician: Dr. Sandre Kitty is an 61 y.o. female.  HPI: Patient was seen and chart reviewed. She was recently admitted to [facility] on 05/11/12 with symptoms of psychosis and ran out of medication. Her symptoms were resolved after restarting her medication and able to discharge stable to home. Patient is a poor historian and difficult to follow her during this visit.   MSE: Patient is obese, stayed in her bed supine, awake and verbally responding but difficult to comprehend due to slurred speech. She denied depression, anxiety and psychosis.   Past Medical History  Diagnosis Date  . Schizoaffective disorder   . Urinary tract infection   . Hypertension   . GERD (gastroesophageal reflux disease)   . Hypothyroidism   . Obesity   . Asthma   . COPD (chronic obstructive pulmonary disease)   . CHF (congestive heart failure)   . Seizures   . Morbid obesity   . Chronic pain   . Hypercholesteremia   . Anginal pain   . Myocardial infarction 2002  . Pneumonia     "several times" (05/23/2012)  . Diabetes mellitus     "I'm not diabetic anymore" (05/23/2012)  . Anemia   . History of blood transfusion 1993    "when I had my hysterectomy" (05/23/2012)  . H/O hiatal hernia   . Daily headache   . Arthritis     "severe; all over my body" (05/23/2012)  . Hypoventilation syndrome     [name] 05/30/2012  . Atrial fibrillation   . Atrial flutter, paroxysmal     [name] 05/30/2012  . Exertional dyspnea   . Shortness of breath     "all the time lately" (05/30/2012)    Past Surgical History  Procedure Date  . Vaginal hysterectomy 1993  . Tubal ligation 1998  . Cholecystectomy ?2008  . Cardiac catheterization 2008    Family History  Problem Relation Age of Onset  . Heart disease Father   . Cancer Mother 70    Breast    Social History:  reports that she quit smoking about 11 years ago. Her smoking use included Cigarettes. She has  a 24 pack-year smoking history. Her smokeless tobacco use includes Snuff and Chew. She reports that she does not drink alcohol or use illicit drugs.  Allergies:  Allergies  Allergen Reactions  . Food Rash    "lasagna"= rash from the noodles  . Penicillins Hives  . Sulfamethoxazole W-Trimethoprim Hives and Nausea And Vomiting    Medications: I have reviewed the patient's current medications.  Results for orders placed during the hospital encounter of 05/30/12 (from the past 48 hour(s))  TSH     Status: Abnormal   Collection Time   05/30/12  7:31 PM      Component Value Range Comment   TSH 5.562 (*) 0.350 - 4.500 uIU/mL   CK TOTAL AND CKMB     Status: Normal   Collection Time   05/30/12 11:41 PM      Component Value Range Comment   Total CK 109  7 - 177 U/L    CK, MB 2.0  0.3 - 4.0 ng/mL    Relative Index 1.8  0.0 - 2.5   TROPONIN I     Status: Normal   Collection Time   05/30/12 11:41 PM      Component Value Range Comment   Troponin I <0.30  <0.30 ng/mL   CK TOTAL AND CKMB  Status: Abnormal   Collection Time   05/31/12  5:24 AM      Component Value Range Comment   Total CK 110  7 - 177 U/L    CK, MB 2.9  0.3 - 4.0 ng/mL    Relative Index 2.6 (*) 0.0 - 2.5   TROPONIN I     Status: Normal   Collection Time   05/31/12  5:24 AM      Component Value Range Comment   Troponin I <0.30  <0.30 ng/mL   BASIC METABOLIC PANEL     Status: Abnormal   Collection Time   05/31/12  5:24 AM      Component Value Range Comment   Sodium 137  135 - 145 mEq/L    Potassium 3.6  3.5 - 5.1 mEq/L    Chloride 97  96 - 112 mEq/L    CO2 30  19 - 32 mEq/L    Glucose, Bld 123 (*) 70 - 99 mg/dL    BUN 11  6 - 23 mg/dL    Creatinine, Ser 7.82 (*) 0.50 - 1.10 mg/dL DELTA CHECK NOTED   Calcium 9.9  8.4 - 10.5 mg/dL    GFR calc non Af Amer 22 (*) >90 mL/min    GFR calc Af Amer 26 (*) >90 mL/min   LIPID PANEL     Status: Abnormal   Collection Time   05/31/12  5:24 AM      Component Value Range  Comment   Cholesterol 159  0 - 200 mg/dL    Triglycerides 956 (*) <150 mg/dL    HDL 52  >21 mg/dL    Total CHOL/HDL Ratio 3.1      VLDL 34  0 - 40 mg/dL    LDL Cholesterol 73  0 - 99 mg/dL   CK TOTAL AND CKMB     Status: Abnormal   Collection Time   05/31/12  9:15 AM      Component Value Range Comment   Total CK 133  7 - 177 U/L    CK, MB 3.6  0.3 - 4.0 ng/mL    Relative Index 2.7 (*) 0.0 - 2.5   BASIC METABOLIC PANEL     Status: Abnormal   Collection Time   05/31/12  9:15 AM      Component Value Range Comment   Sodium 136  135 - 145 mEq/L    Potassium 3.8  3.5 - 5.1 mEq/L    Chloride 96  96 - 112 mEq/L    CO2 28  19 - 32 mEq/L    Glucose, Bld 107 (*) 70 - 99 mg/dL    BUN 10  6 - 23 mg/dL    Creatinine, Ser 3.08 (*) 0.50 - 1.10 mg/dL    Calcium 9.8  8.4 - 65.7 mg/dL    GFR calc non Af Amer 23 (*) >90 mL/min    GFR calc Af Amer 26 (*) >90 mL/min   GLUCOSE, CAPILLARY     Status: Normal   Collection Time   05/31/12 11:23 AM      Component Value Range Comment   Glucose-Capillary 93  70 - 99 mg/dL   GLUCOSE, CAPILLARY     Status: Abnormal   Collection Time   05/31/12  4:11 PM      Component Value Range Comment   Glucose-Capillary 101 (*) 70 - 99 mg/dL    Comment 1 Notify RN     CK TOTAL AND CKMB  Status: Normal   Collection Time   05/31/12  6:13 PM      Component Value Range Comment   Total CK 126  7 - 177 U/L    CK, MB 3.1  0.3 - 4.0 ng/mL    Relative Index 2.5  0.0 - 2.5   DRUGS OF ABUSE SCREEN W/O ALC, ROUTINE URINE     Status: Abnormal   Collection Time   05/31/12  8:47 PM      Component Value Range Comment   Marijuana Metabolite NEGATIVE  Negative    Amphetamine Screen, Ur NEGATIVE  Negative    Barbiturate Quant, Ur NEGATIVE  Negative    Methadone NEGATIVE  Negative    Benzodiazepines. NEGATIVE  Negative    Phencyclidine (PCP) NEGATIVE  Negative    Cocaine Metabolites NEGATIVE  Negative    Opiate Screen, Urine POSITIVE (*) Negative    Propoxyphene NEGATIVE   Negative    Creatinine,U 432.9     OSMOLALITY, URINE     Status: Abnormal   Collection Time   05/31/12  8:47 PM      Component Value Range Comment   Osmolality, Ur 308 (*) 390 - 1090 mOsm/kg   SODIUM, URINE, RANDOM     Status: Normal   Collection Time   05/31/12  8:47 PM      Component Value Range Comment   Sodium, Ur 46     CREATININE, URINE, RANDOM     Status: Normal   Collection Time   05/31/12  8:47 PM      Component Value Range Comment   Creatinine, Urine 358.18     GLUCOSE, CAPILLARY     Status: Abnormal   Collection Time   05/31/12  9:05 PM      Component Value Range Comment   Glucose-Capillary 168 (*) 70 - 99 mg/dL   CK TOTAL AND CKMB     Status: Normal   Collection Time   05/31/12 11:06 PM      Component Value Range Comment   Total CK 138  7 - 177 U/L    CK, MB 3.0  0.3 - 4.0 ng/mL    Relative Index 2.2  0.0 - 2.5   GLUCOSE, CAPILLARY     Status: Normal   Collection Time   06/01/12  5:37 AM      Component Value Range Comment   Glucose-Capillary 91  70 - 99 mg/dL   BASIC METABOLIC PANEL     Status: Abnormal   Collection Time   06/01/12  7:30 AM      Component Value Range Comment   Sodium 135  135 - 145 mEq/L    Potassium 6.1 (*) 3.5 - 5.1 mEq/L HEMOLYZED SPECIMEN, RESULTS MAY BE AFFECTED   Chloride 97  96 - 112 mEq/L    CO2 30  19 - 32 mEq/L    Glucose, Bld 104 (*) 70 - 99 mg/dL    BUN 15  6 - 23 mg/dL    Creatinine, Ser 1.47 (*) 0.50 - 1.10 mg/dL    Calcium 9.4  8.4 - 82.9 mg/dL    GFR calc non Af Amer 15 (*) >90 mL/min    GFR calc Af Amer 17 (*) >90 mL/min   CBC WITH DIFFERENTIAL     Status: Abnormal   Collection Time   06/01/12  7:30 AM      Component Value Range Comment   WBC 7.1  4.0 - 10.5 K/uL    RBC 3.63 (*)  3.87 - 5.11 MIL/uL    Hemoglobin 11.9 (*) 12.0 - 15.0 g/dL    HCT 16.1  09.6 - 04.5 %    MCV 106.1 (*) 78.0 - 100.0 fL    MCH 32.8  26.0 - 34.0 pg    MCHC 30.9  30.0 - 36.0 g/dL    RDW 40.9  81.1 - 91.4 %    Platelets 109 (*) 150 - 400  K/uL CONSISTENT WITH PREVIOUS RESULT   Neutrophils Relative 55  43 - 77 %    Neutro Abs 3.9  1.7 - 7.7 K/uL    Lymphocytes Relative 21  12 - 46 %    Lymphs Abs 1.5  0.7 - 4.0 K/uL    Monocytes Relative 23 (*) 3 - 12 %    Monocytes Absolute 1.6 (*) 0.1 - 1.0 K/uL    Eosinophils Relative 1  0 - 5 %    Eosinophils Absolute 0.1  0.0 - 0.7 K/uL    Basophils Relative 0  0 - 1 %    Basophils Absolute 0.0  0.0 - 0.1 K/uL   TROPONIN I     Status: Normal   Collection Time   06/01/12  7:30 AM      Component Value Range Comment   Troponin I <0.30  <0.30 ng/mL   GLUCOSE, CAPILLARY     Status: Abnormal   Collection Time   06/01/12 11:27 AM      Component Value Range Comment   Glucose-Capillary 110 (*) 70 - 99 mg/dL   RENAL FUNCTION PANEL     Status: Abnormal   Collection Time   06/01/12  3:30 PM      Component Value Range Comment   Sodium 136  135 - 145 mEq/L    Potassium 4.2  3.5 - 5.1 mEq/L DELTA CHECK NOTED   Chloride 96  96 - 112 mEq/L    CO2 30  19 - 32 mEq/L    Glucose, Bld 103 (*) 70 - 99 mg/dL    BUN 17  6 - 23 mg/dL    Creatinine, Ser 7.82 (*) 0.50 - 1.10 mg/dL    Calcium 9.8  8.4 - 95.6 mg/dL    Phosphorus 4.0  2.3 - 4.6 mg/dL    Albumin 2.9 (*) 3.5 - 5.2 g/dL    GFR calc non Af Amer 16 (*) >90 mL/min    GFR calc Af Amer 19 (*) >90 mL/min   GLUCOSE, CAPILLARY     Status: Abnormal   Collection Time   06/01/12  4:27 PM      Component Value Range Comment   Glucose-Capillary 102 (*) 70 - 99 mg/dL   CBC     Status: Abnormal   Collection Time   06/01/12  5:56 PM      Component Value Range Comment   WBC 8.1  4.0 - 10.5 K/uL    RBC 3.89  3.87 - 5.11 MIL/uL    Hemoglobin 12.7  12.0 - 15.0 g/dL    HCT 21.3  08.6 - 57.8 %    MCV 106.2 (*) 78.0 - 100.0 fL    MCH 32.6  26.0 - 34.0 pg    MCHC 30.8  30.0 - 36.0 g/dL    RDW 46.9  62.9 - 52.8 %    Platelets 114 (*) 150 - 400 K/uL CONSISTENT WITH PREVIOUS RESULT    No results found.  Positive for bipolar, mood swings and sleep  disturbance Blood pressure 93/40, pulse 108, temperature 98.4  F (36.9 C), temperature source Oral, resp. rate 20, height 5\' 2"  (1.575 m), weight 344 lb 3.2 oz (156.128 kg), SpO2 93.00%.   Assessment/Plan: Schizoaffective Disorder MRE is unspecified  Recommendation: Recommend to continue depakote 500 daily and restart seroquel 300 mg Qhs which was taken at the time of November hospitalization. I will sign off and may contact if further assistance needed. Appreciate psych consult.  Demarkis Gheen,JANARDHAHA R. 06/01/2012, 7:05 PM

## 2012-06-02 LAB — CK TOTAL AND CKMB (NOT AT ARMC)
CK, MB: 1.7 ng/mL (ref 0.3–4.0)
CK, MB: 1.7 ng/mL (ref 0.3–4.0)
Relative Index: INVALID (ref 0.0–2.5)
Relative Index: INVALID (ref 0.0–2.5)
Total CK: 50 U/L (ref 7–177)
Total CK: 45 U/L (ref 7–177)

## 2012-06-02 LAB — RENAL FUNCTION PANEL
Albumin: 2.5 g/dL — ABNORMAL LOW (ref 3.5–5.2)
Albumin: 2.4 g/dL — ABNORMAL LOW (ref 3.5–5.2)
Albumin: 2.6 g/dL — ABNORMAL LOW (ref 3.5–5.2)
BUN: 16 mg/dL (ref 6–23)
BUN: 14 mg/dL (ref 6–23)
BUN: 13 mg/dL (ref 6–23)
CO2: 29 mEq/L (ref 19–32)
CO2: 30 mEq/L (ref 19–32)
Chloride: 101 mEq/L (ref 96–112)
Chloride: 100 mEq/L (ref 96–112)
Chloride: 100 mEq/L (ref 96–112)
Creatinine, Ser: 1.87 mg/dL — ABNORMAL HIGH (ref 0.50–1.10)
GFR calc non Af Amer: 35 mL/min — ABNORMAL LOW (ref >90)
GFR calc non Af Amer: 49 mL/min — ABNORMAL LOW (ref >90)
Glucose, Bld: 84 mg/dL (ref 70–99)
Phosphorus: 2.8 mg/dL (ref 2.3–4.6)
Potassium: 4.5 mEq/L (ref 3.5–5.1)
Potassium: 4.1 mEq/L (ref 3.5–5.1)
Potassium: 4.5 mEq/L (ref 3.5–5.1)
Sodium: 138 mEq/L (ref 135–145)

## 2012-06-02 LAB — CBC
HCT: 37.1 % (ref 36.0–46.0)
Hemoglobin: 11.6 g/dL — ABNORMAL LOW (ref 12.0–15.0)
MCV: 104.8 fL — ABNORMAL HIGH (ref 78.0–100.0)
WBC: 6.8 10*3/uL (ref 4.0–10.5)

## 2012-06-02 LAB — GLUCOSE, CAPILLARY
Glucose-Capillary: 109 mg/dL — ABNORMAL HIGH (ref 70–99)
Glucose-Capillary: 90 mg/dL (ref 70–99)
Glucose-Capillary: 112 mg/dL — ABNORMAL HIGH (ref 70–99)

## 2012-06-02 MED ORDER — CIPROFLOXACIN HCL 500 MG PO TABS
500.0000 mg | ORAL_TABLET | Freq: Two times a day (BID) | ORAL | Status: DC
  Administered 2012-06-02 – 2012-06-03 (×4): 500 mg via ORAL
  Filled 2012-06-02 (×5): qty 1

## 2012-06-02 MED ORDER — ALUM & MAG HYDROXIDE-SIMETH 200-200-20 MG/5ML PO SUSP
30.0000 mL | Freq: Four times a day (QID) | ORAL | Status: DC | PRN
  Administered 2012-06-02: 30 mL via ORAL
  Filled 2012-06-02: qty 30

## 2012-06-02 MED ORDER — NITROGLYCERIN 0.4 MG SL SUBL
SUBLINGUAL_TABLET | SUBLINGUAL | Status: AC
  Administered 2012-06-02: 0.4 mg
  Filled 2012-06-02: qty 25

## 2012-06-02 MED ORDER — NITROGLYCERIN 0.4 MG SL SUBL
SUBLINGUAL_TABLET | SUBLINGUAL | Status: AC
  Administered 2012-06-02: 20:00:00
  Filled 2012-06-02: qty 25

## 2012-06-02 MED ORDER — PANTOPRAZOLE SODIUM 40 MG PO TBEC
40.0000 mg | DELAYED_RELEASE_TABLET | Freq: Two times a day (BID) | ORAL | Status: DC
  Administered 2012-06-02 – 2012-06-03 (×2): 40 mg via ORAL
  Filled 2012-06-02 (×2): qty 1

## 2012-06-02 NOTE — Progress Notes (Signed)
Acute Kidney Injury, nonoliguric probably hemodynamically mediated due to hypotension in presence of ACE-I Rx. Improving Plan: We will sign off. Thanks.    Subjective: Interval History: I don't feel good  Objective: Vital signs in last 24 hours: Temp:  [98.4 F (36.9 C)-99.5 F (37.5 C)] 99.5 F (37.5 C) (12/14 0513) Pulse Rate:  [105-108] 105  (12/14 0513) Resp:  [18-20] 18  (12/14 0513) BP: (86-121)/(40-50) 121/50 mmHg (12/14 0513) SpO2:  [74 %-96 %] 74 % (12/14 0930) Weight:  [342 lb 8 oz (155.357 kg)] 342 lb 8 oz (155.357 kg) (12/14 0513) Weight change: -1 lb 11.2 oz (-0.771 kg)  Intake/Output from previous day: 12/13 0701 - 12/14 0700 In: 3946.3 [P.O.:720; I.V.:3226.3] Out: 1045 [Urine:1045] Intake/Output this shift: Total I/O In: -  Out: 100 [Urine:100]  General appearance: alert and cooperative GI: obese Morbidly obese female  Lab Results:  Basename 06/02/12 0520 06/01/12 1756  WBC 6.8 8.1  HGB 11.6* 12.7  HCT 37.1 41.3  PLT 129* 114*   BMET:  Basename 06/02/12 0910 06/02/12 0520  NA 138 138  K 4.1 4.5  CL 100 101  CO2 31 29  GLUCOSE 95 84  BUN 14 16  CREATININE 1.58* 1.87*  CALCIUM 9.2 9.2   No results found for this basename: PTH:2 in the last 72 hours Iron Studies: No results found for this basename: IRON,TIBC,TRANSFERRIN,FERRITIN in the last 72 hours Studies/Results: No results found.  Scheduled:   . albuterol  2.5 mg Nebulization Q6H  . aspirin EC  81 mg Oral Daily  . ciprofloxacin  500 mg Oral BID  . divalproex  500 mg Oral QHS  . enoxaparin (LOVENOX) injection  40 mg Subcutaneous Q24H  . fluticasone  2 spray Each Nare Daily  . folic acid  1 mg Oral Daily  . insulin aspart  0-9 Units Subcutaneous TID WC  . ipratropium  0.5 mg Nebulization Q6H  . levothyroxine  150 mcg Oral Daily  . multivitamin with minerals  1 tablet Oral Daily  . pantoprazole  40 mg Oral Daily  . QUEtiapine  300 mg Oral QHS  . sodium chloride  3 mL Intravenous  Q12H   Continuous:   . sodium chloride 50 mL/hr (06/02/12 1220)      LOS: 3 days   Yash Cacciola C 06/02/2012,1:14 PM

## 2012-06-02 NOTE — Progress Notes (Signed)
Subjective:  Patient denies any chest pain or shortness of breath. Concern about her renal function which is improving. Urine culture positive for Escherichia coli  Objective:  Vital Signs in the last 24 hours: Temp:  [98.4 F (36.9 C)-99.5 F (37.5 C)] 99.5 F (37.5 C) (12/14 0513) Pulse Rate:  [105-108] 105  (12/14 0513) Resp:  [18-20] 18  (12/14 0513) BP: (86-121)/(40-50) 121/50 mmHg (12/14 0513) SpO2:  [74 %-96 %] 74 % (12/14 0930) Weight:  [155.357 kg (342 lb 8 oz)] 155.357 kg (342 lb 8 oz) (12/14 0513)  Intake/Output from previous day: 12/13 0701 - 12/14 0700 In: 3946.3 [P.O.:720; I.V.:3226.3] Out: 1045 [Urine:1045] Intake/Output from this shift: Total I/O In: -  Out: 100 [Urine:100]  Physical Exam: Neck: no adenopathy, no carotid bruit, no JVD and supple, symmetrical, trachea midline Lungs: clear to auscultation bilaterally Heart: regular rate and rhythm, S1, S2 normal and Soft systolic murmur noted Abdomen: Obese nontender Extremities: extremities normal, atraumatic, no cyanosis or edema  Lab Results:  [facility] 06/02/12 0520 06/01/12 1756  WBC 6.8 8.1  HGB 11.6* 12.7  PLT 129* 114*    Basename 06/02/12 0910 06/02/12 0520  NA 138 138  K 4.1 4.5  CL 100 101  CO2 31 29  GLUCOSE 95 84  BUN 14 16  CREATININE 1.58* 1.87*    Basename 06/01/12 0730 05/31/12 0524  TROPONINI <0.30 <0.30   Hepatic Function Panel  Basename 06/02/12 0910 05/30/12 1204  PROT -- 6.3  ALBUMIN 2.4* --  AST -- 34  ALT -- 25  ALKPHOS -- 54  BILITOT -- 0.4  BILIDIR -- --  IBILI -- --    Basename 05/31/12 0524  CHOL 159   No results found for this basename: PROTIME in the last 72 hours  Imaging: Imaging results have been reviewed and No results found.  Cardiac Studies:  Assessment/Plan:  Status post atypical chest pain Hypertension Acute renal insufficiency multifactorial improved UTI Mild coronary artery disease History of paroxysmal A. flutter in past Morbid  obesity Hypothyroidism GERD Asthma COPD Obstructive sleep apnea/obesity hypoventilation syndrome Seizure disorder Hypercholesteremia Schizoaffective disorder Plan Start Cipro as per orders Check labs in a.m. Social service for discharge planning  LOS: 3 days    Saketh Daubert N 06/02/2012, 11:03 AM

## 2012-06-02 NOTE — Progress Notes (Signed)
Pt. Complaining of 10/10 chest pain.  She also states she can't catch her breath.  EKG obtained showing normal sinus rhythm.  BP 121/72 HR 91.  Oxygen saturation 96% on 2L oxygen.  Pt. Given 3 nitro sublingual with no relief.  When asked to point where pain is, it is upper epigastric region.  Dr. Sharyn Lull called and made aware.  See new orders.  Will continue to monitor patient.

## 2012-06-03 ENCOUNTER — Encounter (HOSPITAL_COMMUNITY): Payer: Self-pay | Admitting: *Deleted

## 2012-06-03 ENCOUNTER — Emergency Department (HOSPITAL_COMMUNITY)
Admission: EM | Admit: 2012-06-03 | Discharge: 2012-06-03 | Discharge status: Against Medical Advice | Payer: Medicare Other | Attending: Emergency Medicine | Admitting: Emergency Medicine

## 2012-06-03 DIAGNOSIS — R319 Hematuria, unspecified: Secondary | ICD-10-CM | POA: Insufficient documentation

## 2012-06-03 LAB — RENAL FUNCTION PANEL
BUN: 10 mg/dL (ref 6–23)
Calcium: 10.2 mg/dL (ref 8.4–10.5)
Glucose, Bld: 97 mg/dL (ref 70–99)
Phosphorus: 2.6 mg/dL (ref 2.3–4.6)

## 2012-06-03 LAB — CBC
HCT: 37.8 % (ref 36.0–46.0)
MCHC: 31 g/dL (ref 30.0–36.0)
MCV: 105.3 fL — ABNORMAL HIGH (ref 78.0–100.0)
RDW: 15.2 % (ref 11.5–15.5)

## 2012-06-03 LAB — GLUCOSE, CAPILLARY: Glucose-Capillary: 84 mg/dL (ref 70–99)

## 2012-06-03 LAB — CK TOTAL AND CKMB (NOT AT ARMC)
CK, MB: 1.7 ng/mL (ref 0.3–4.0)
Total CK: 28 U/L (ref 7–177)

## 2012-06-03 MED ORDER — METOPROLOL SUCCINATE ER 50 MG PO TB24: 50.0000 mg | ORAL_TABLET | Freq: Every day | ORAL | Status: DC

## 2012-06-03 MED ORDER — CIPROFLOXACIN HCL 500 MG PO TABS: 500.0000 mg | ORAL_TABLET | Freq: Two times a day (BID) | ORAL | Status: DC

## 2012-06-03 NOTE — ED Notes (Signed)
Called pt for room X 3 in waiting area, looked around for pt as well; pt had made comment to me earlier that she wanted to go home so she could eat; and i had explained to pt reasons why we couldn't feed her until she saw MD

## 2012-06-03 NOTE — Discharge Summary (Signed)
  Discharge summary dictated on 06/03/2012 dictation number is [phone]

## 2012-06-03 NOTE — Progress Notes (Signed)
Patient discharge home.  Medications reviewed with patient. Patient verbalized understanding. [facility] transported patient home

## 2012-06-03 NOTE — Progress Notes (Signed)
Faxed orders for Resumption of Care to [facility] Intake. Isidoro Donning RN CCM Case Mgmt phone [phone]

## 2012-06-03 NOTE — ED Notes (Addendum)
Patient with bilat leg pain and hematuria.  Patient was discharged from here this morning with same.  Patient states she is having more hematuria.  Patient states she is bleeding from her privates.

## 2012-06-03 NOTE — ED Notes (Addendum)
Assisted pt to phone, and helped pt dial numbers to get in touch with family (pt called 3 different ppl); when pt finished, wheeled pt back to waiting room and got pt a warm blanket, pt then asked for me to wheel her back to phone to call to tell her family to bring her food; explained to pt she cannot eat until sees MD, also told pt that she had just used phone and i needed to assist another family

## 2012-06-04 LAB — OPIATE, QUANTITATIVE, URINE
6 Monoacetylmorphine, Ur-Confirm: NEGATIVE ng/mL
Morphine, Confirm: 1681 ng/mL
Norhydrocodone, Ur: NEGATIVE ng/mL
Noroxycodone, Ur: NEGATIVE ng/mL
Oxymorphone: NEGATIVE ng/mL

## 2012-06-04 LAB — GLUCOSE, CAPILLARY
Glucose-Capillary: 134 mg/dL — ABNORMAL HIGH (ref 70–99)
Glucose-Capillary: 115 mg/dL — ABNORMAL HIGH (ref 70–99)

## 2012-06-04 NOTE — Progress Notes (Signed)
Late entry:  Patient was d/c home 06/03/12.  She requested EMS transport because she did not have anyone to come and pick her up. CSW met with patient and discussed other possible options including bus and cab but she deferred due to health reasons.  Patient was counseled that her insurance could possible not pay on the EMS bill as she could potentially travel home another way. She still insisted on EMS transport.  EMS form completed and called; notified pt's nurse.  Support provided to patient. No further CSW intervention indicated.  Lorri Frederick. [place]  [phone]

## 2012-06-04 NOTE — Discharge Summary (Signed)
[name], [name]               ACCOUNT NO.:  [id number]  MEDICAL RECORD NO.:  [id number]  LOCATION:  4715                         FACILITY:  [facility]  PHYSICIAN:  [name], M.D. DATE OF BIRTH:  Nov 25, 1950  DATE OF ADMISSION:  05/30/2012 DATE OF DISCHARGE:  06/03/2012                              DISCHARGE SUMMARY   ADMITTING DIAGNOSES: 1. Atypical chest pain; rule out myocardial infarction; mild coronary     artery disease. 2. Hypertension. 3. Paroxysmal atrial flutter. 4. Chronic obstructive pulmonary disease. 5. Obstructive sleep apnea/obesity hypoventilation syndrome. 6. Hypothyroidism. 7. Tobacco abuse. 8. Morbid obesity. 9. Hiatus hernia. 10.Seizure disorder. 11.History of schizoaffective disorder. 12.Resolving urinary tract infection.  FINAL DIAGNOSES: 1. Atypical chest pain; mild coronary artery disease; myocardial     infarction ruled out, negative nuclear stress test in the past. 2. Hypertension. 3. Paroxysmal atrial flutter. 4. Chronic obstructive pulmonary disease. 5. Obstructive sleep apnea/obesity hypoventilation syndrome. 6. Hypothyroidism. 7. Tobacco abuse. 8. Morbid obesity. 9. Hiatus hernia. 10.Seizure disorder. 11.History of schizoaffective disorder. 12.Resolving urinary tract infection. 13.Status post acute renal failure secondary to hypovolemia,     hypotension, and ACE inhibitors.  DISCHARGE HOME MEDICATIONS: 1. Ciprofloxacin 500 mg twice daily for 1 week. 2. Metoprolol succinate 50 mg 1 tablet daily. 3. Continue rest of the home medications, i.e. albuterol inhaler 1     puff every 6 hours as needed, enteric-coated aspirin 81 mg 1 tablet     daily, Depakote 500 mg at bedtime, Flonase 2 sprays into nose daily     as needed, folic acid 1 mg daily, levothyroxine 150 mcg daily,     multivitamin with minerals 1 tablet daily, Nitrostat 0.4 mg     sublingual to use as directed, omeprazole 20 mg p.o. daily,     oxycodone 1 tablet by mouth  every 4 hours as needed for pain,     simvastatin 20 mg 1 tablet daily, tramadol 50 mg every 6 hours as     needed for pain.  The patient has been advised to stop lisinopril.  BRIEF HISTORY AND HOSPITAL COURSE:  Ms. Pounds is a 61 year old female with past medical history significant for multiple medical problems, i.e., hypertension, COPD, obstructive sleep apnea/obesity hypoventilation syndrome, hypothyroidism, history of tobacco abuse; morbid obesity, history of seizure disorder, mild coronary artery disease, history of bipolar disorder, history of paroxysmal atrial flutter, history of hiatus hernia.  She came to the ER complaining of recurrent retrosternal chest pain and generalized pain in the arm and leg for 2-3 days.  States chest pain is constant and it gets worst with movement.  Also complains of shortness of breath with minimal exertion, which is persistent.  The patient denies any cough, fever, chills, but complains of dysuria.  The patient denies any palpitation, lightheadedness, or syncope.  The patient was recently discharged from the hospital and had negative cardiac markers.  Stress test could not be done because of her morbid obesity.  The patient denies any PND, orthopnea, or leg swelling.  The patient had cardiac cath a few years ago which showed mild coronary artery disease and negative stress test earlier this year.  PAST MEDICAL HISTORY:  As above.  PHYSICAL EXAMINATION:  GENERAL:  She is alert, awake, oriented x3. VITAL SIGNS:  Blood pressure was 95/60, pulse was 109.  She was afebrile.  HEENT:  Conjunctivae was pink. NECK:  Supple.  No JVD.  No bruit. LUNGS:  Clear to auscultation without rhonchi or rales. CARDIOVASCULAR:  S1, S2 was normal.  There was faint systolic murmur. No S3 gallop. ABDOMEN:  Soft, obese, and nontender. EXTREMITIES:  There is no clubbing, cyanosis, or edema.  ADMISSION LABS:  Sodium was 137, potassium 3.6, BUN 11, creatinine  2.28. Three sets of cardiac enzymes were normal.  Cholesterol was 159, triglycerides 168, HDL 52, LDL 73.  Repeat electrolytes, sodium 136, potassium 4.2, BUN 17, creatinine 2.97.  Repeat electrolytes, BUN 14, creatinine 1.87 which is trending down.  Yesterday BUN 13, creatinine 1.19, today BUN is 10, creatinine is 1.0.  Four sets of cardiac enzymes were negative.  Hemoglobin was 11.6, hematocrit 37.1, white count of 6.8, today hemoglobin is 11.7, hematocrit 37.8, white count of 5.9. Urine cultures were positive for E. coli.  BRIEF HOSPITAL COURSE:  The patient was admitted to telemetry unit.  MI was ruled out by serial enzymes and EKG.  The patient went into acute renal failure due to hypotension, hypovolemia, and ACE inhibitors.  ACE inhibitors were discontinued.  The patient received IV hydration with normalization of renal function.  The patient was started on ciprofloxacin with improvement in her dysuria.  The patient is out of bed, sitting in the chair without any complaints.  The patient remained afebrile during the hospital stay.  The patient will be discharged home on above medications and will be followed up in my office in 1-2 weeks. The patient has been advised regarding lifestyle modification and diet and weight reduction, to which he agrees.  The patient will be followed up as outpatient by PMD and also will be referred for sleep studies as outpatient.     [name], M.D.     [facility]  D:  06/03/2012  T:  06/04/2012  Job:  [id number]

## 2012-06-07 IMAGING — CR DG ABDOMEN ACUTE W/ 1V CHEST
5 series · 5 of 5 positions shown · non-contrast
Comparison: Chest and abdominal radiographs performed 11/30/2010

CLINICAL DATA: Lower abdominal pain.

ACUTE ABDOMEN SERIES (ABDOMEN 2 VIEW & CHEST 1 VIEW)

[w abdomen decub * (1 of 2)]
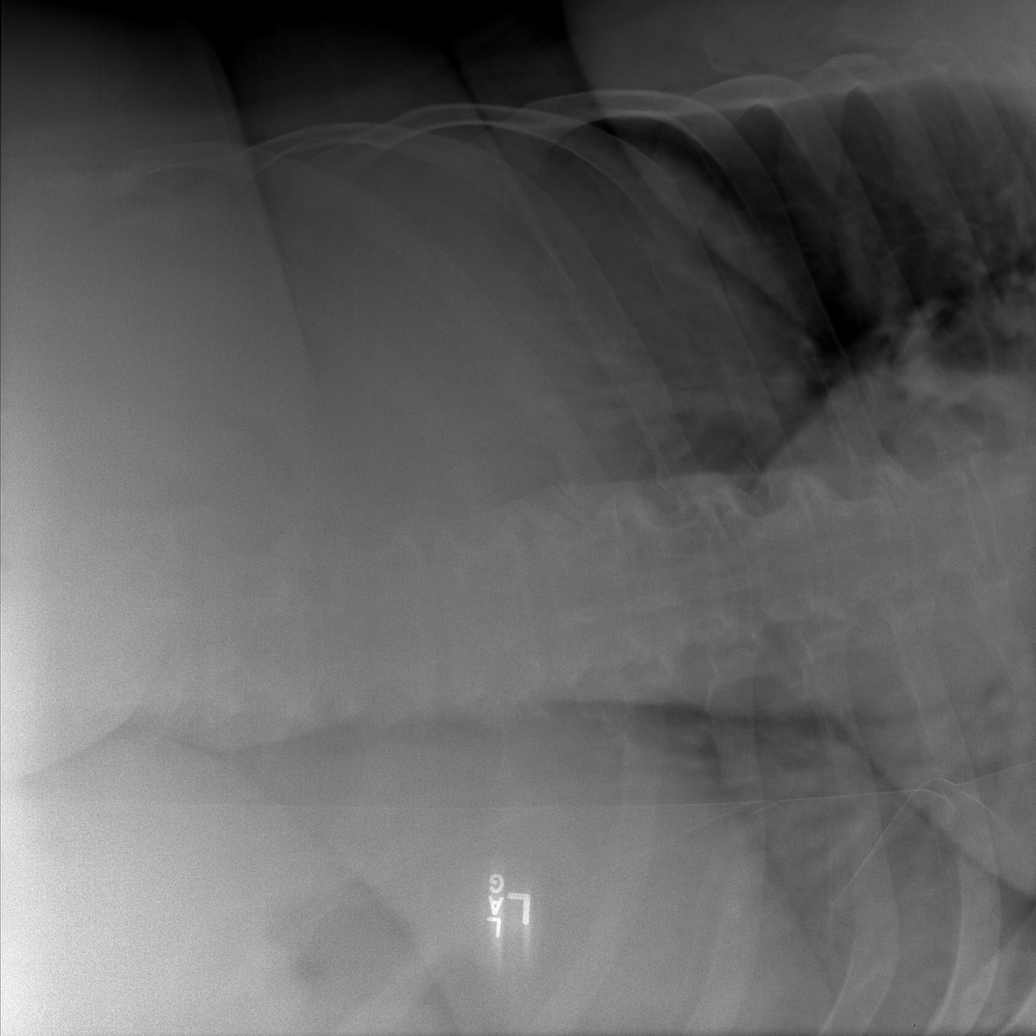

[w abdomen decub * (2 of 2)]
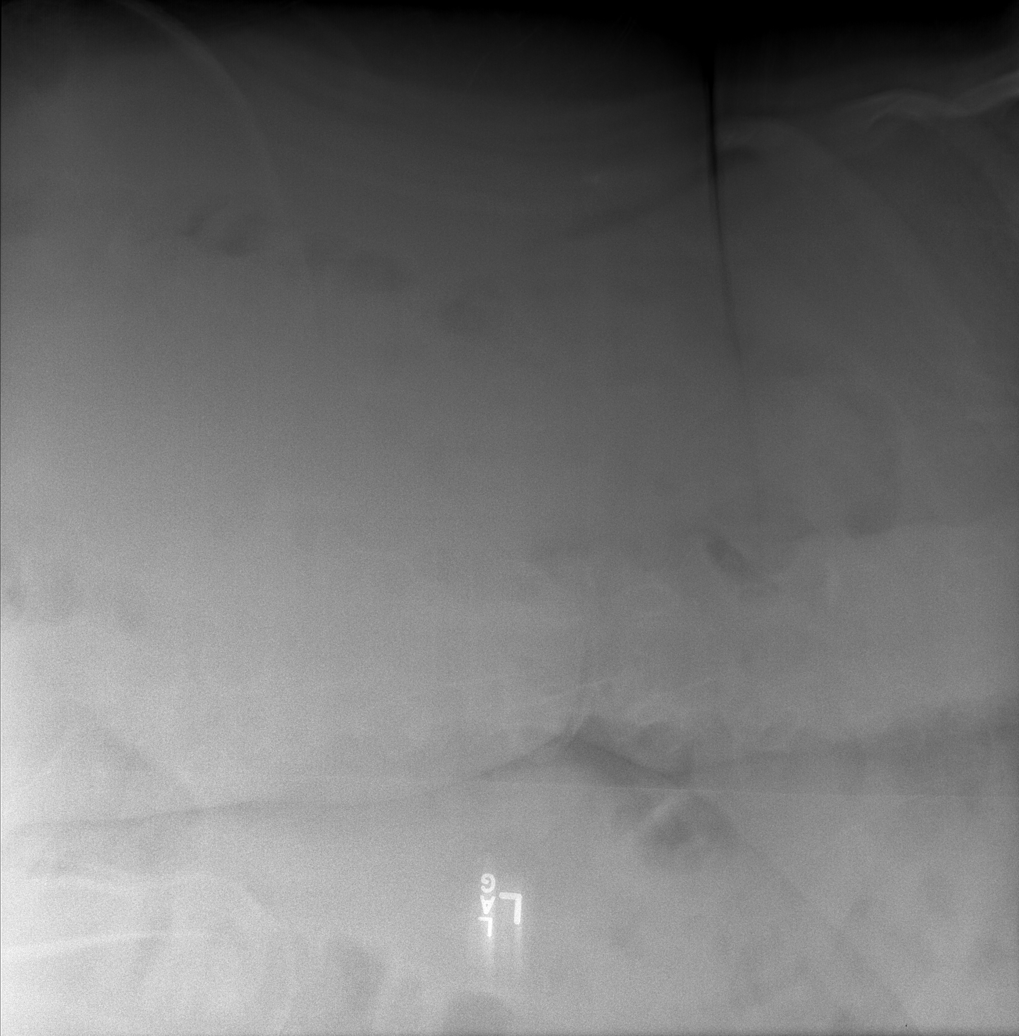

[view not recorded (1 of 3)]
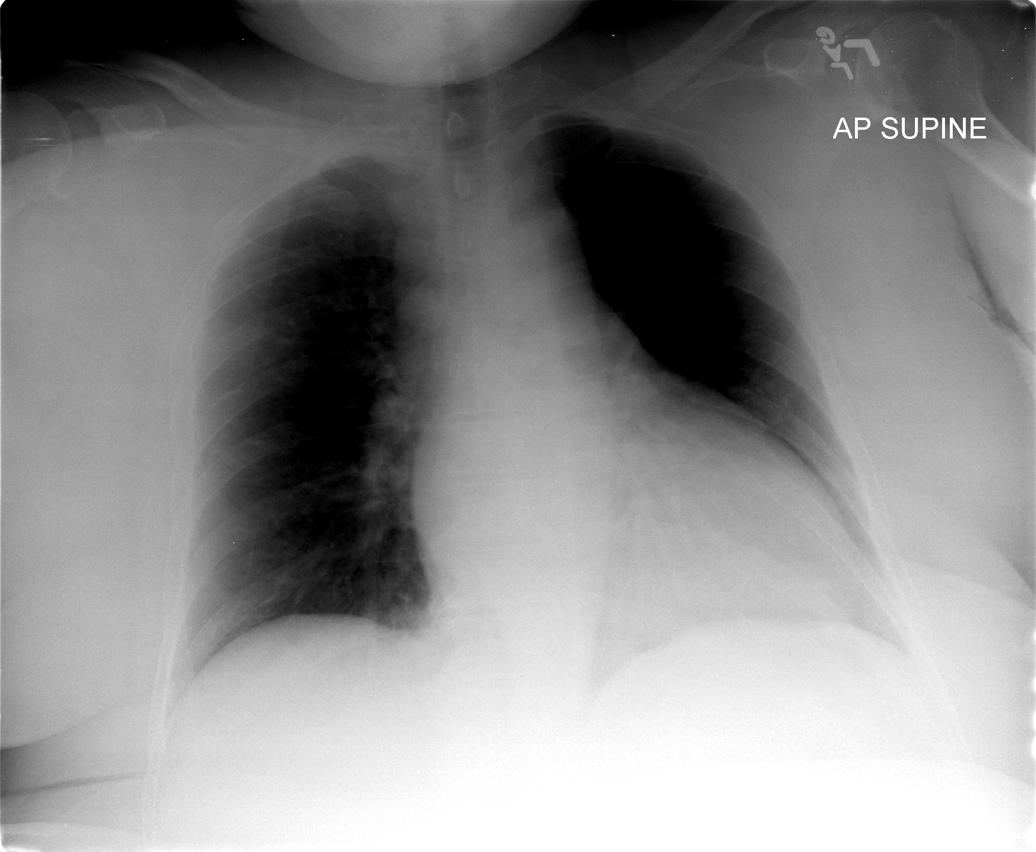

[view not recorded (2 of 3)]
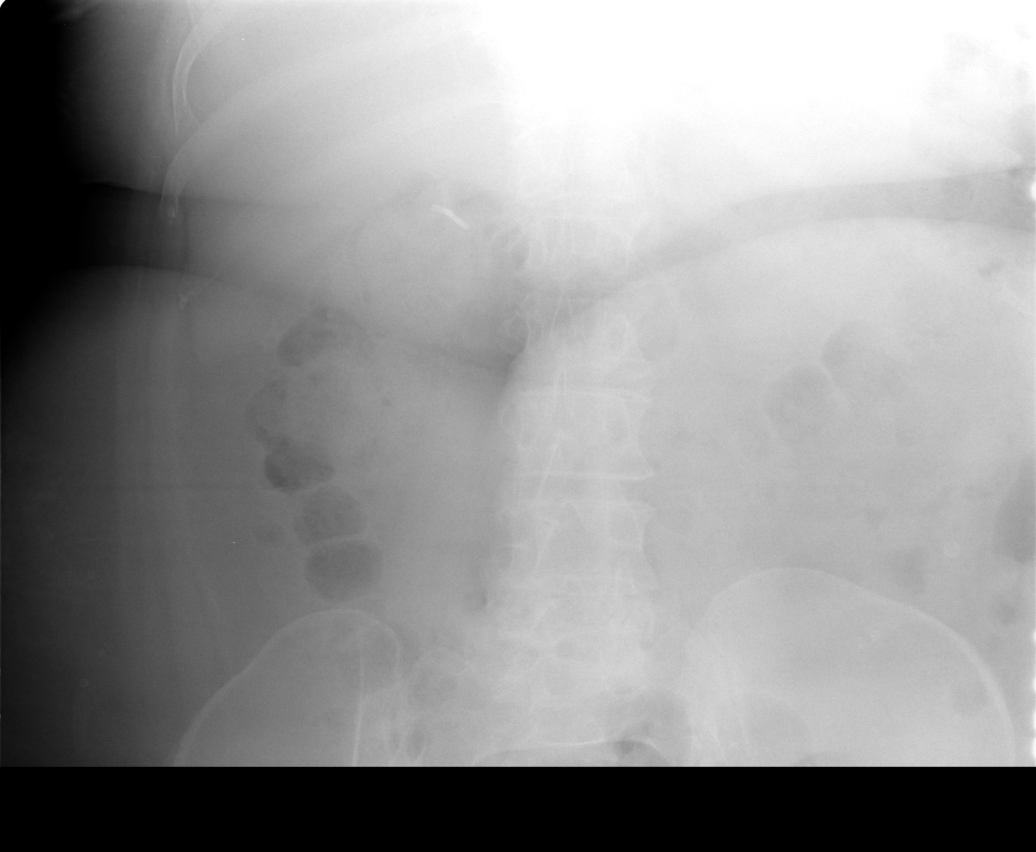

[view not recorded (3 of 3)]
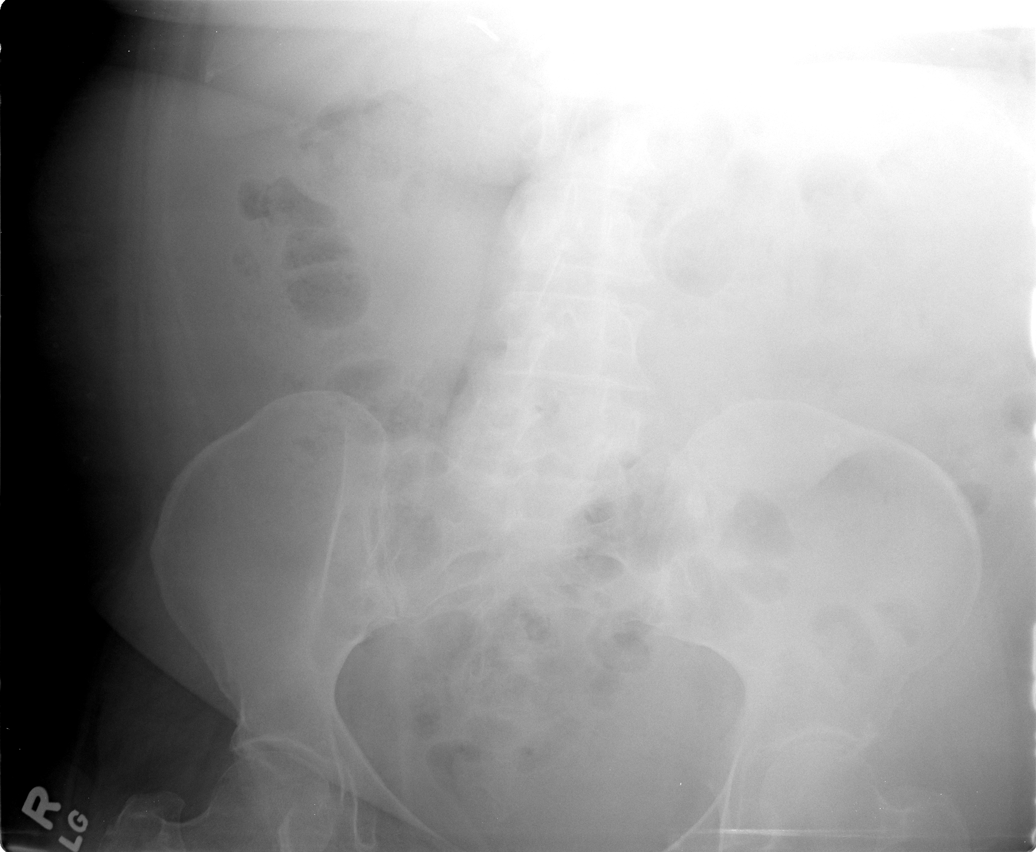

[5 of 5 positions shown; findings below may reference images not displayed]

FINDINGS: The The lungs are well-aerated and clear.  There is no
evidence of focal opacification, pleural effusion or pneumothorax.
The cardiomediastinal silhouette is within normal limits.

The visualized bowel gas pattern is unremarkable.  Stool and air
are noted throughout the colon; there is no evidence of small bowel
dilatation to suggest obstruction.  No free intra-abdominal air is
identified on the provided decubitus view.  Clips are noted within
the right upper quadrant, reflecting prior cholecystectomy.

No acute osseous abnormalities are seen; the sacroiliac joints are
unremarkable in appearance.
IMPRESSION: 1.  Unremarkable bowel gas pattern; no free intra-abdominal air
seen.
2.  No acute cardiopulmonary process identified.

## 2012-06-13 IMAGING — CR DG CHEST 2V
1 series · 1 of 1 positions shown · non-contrast
Comparison: Chest radiograph 01/03/2011

CLINICAL DATA: Chest pain

CHEST - 2 VIEW

[w chest lat *]
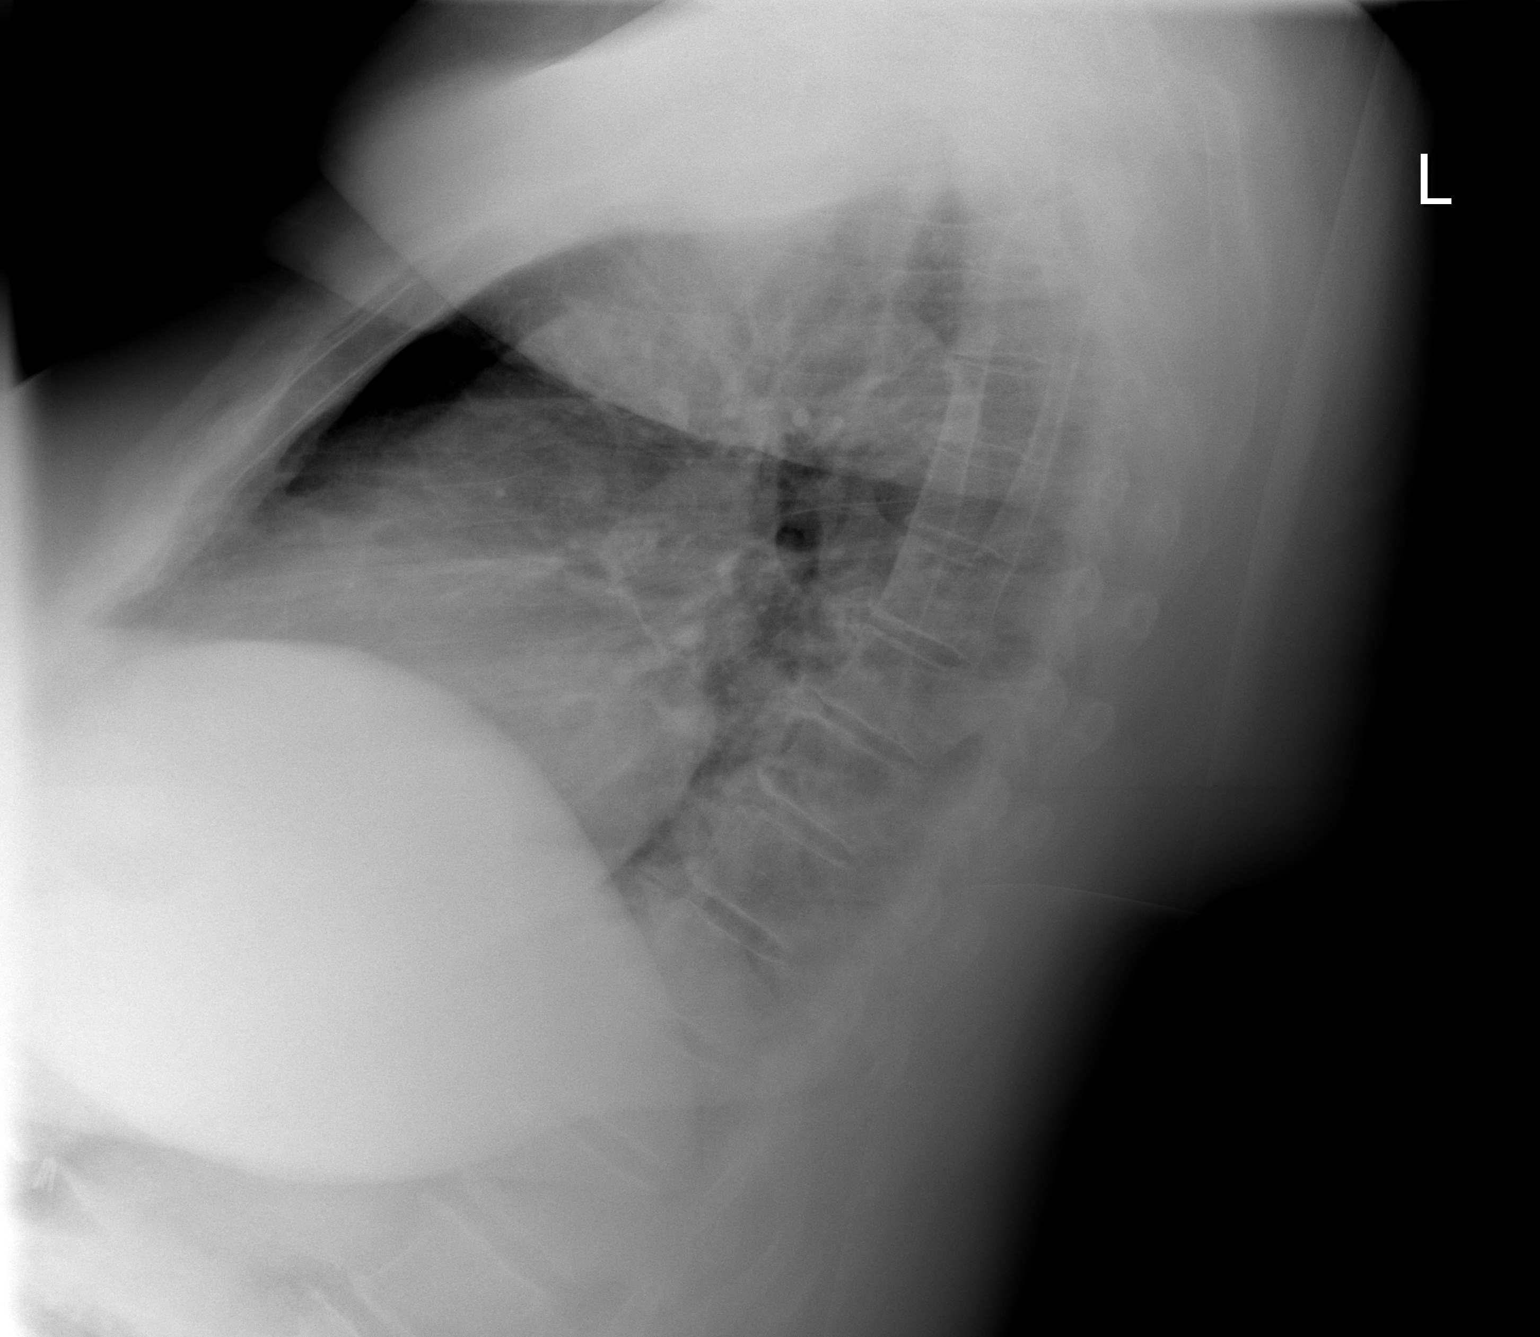

[1 of 1 positions shown; findings below may reference images not displayed]

FINDINGS: Stable cardiomegaly.  Stable mediastinal hilar contours.
Normal pulmonary vascularity.  Trachea is midline and the lungs are
clear.  No airspace disease, effusion, or pneumothorax is
identified.  On the lateral view, some soft tissue contour projects
over the lower chest, limiting evaluation. No acute bony
abnormality.
IMPRESSION: No acute cardiopulmonary disease identified.  Stable cardiomegaly.

## 2012-06-16 ENCOUNTER — Emergency Department (HOSPITAL_COMMUNITY): Payer: Medicare Other

## 2012-06-16 ENCOUNTER — Emergency Department (HOSPITAL_COMMUNITY)
Admission: EM | Admit: 2012-06-16 | Discharge: 2012-06-16 | Disposition: A | Discharge status: Skilled Nursing Facility | Payer: Medicare Other | Attending: Emergency Medicine | Admitting: Emergency Medicine

## 2012-06-16 ENCOUNTER — Encounter (HOSPITAL_COMMUNITY): Payer: Self-pay | Admitting: *Deleted

## 2012-06-16 DIAGNOSIS — Z87440 Personal history of urinary (tract) infections: Secondary | ICD-10-CM | POA: Insufficient documentation

## 2012-06-16 DIAGNOSIS — J4489 Other specified chronic obstructive pulmonary disease: Secondary | ICD-10-CM | POA: Insufficient documentation

## 2012-06-16 DIAGNOSIS — I252 Old myocardial infarction: Secondary | ICD-10-CM | POA: Insufficient documentation

## 2012-06-16 DIAGNOSIS — I509 Heart failure, unspecified: Secondary | ICD-10-CM | POA: Insufficient documentation

## 2012-06-16 DIAGNOSIS — Z8739 Personal history of other diseases of the musculoskeletal system and connective tissue: Secondary | ICD-10-CM | POA: Insufficient documentation

## 2012-06-16 DIAGNOSIS — J449 Chronic obstructive pulmonary disease, unspecified: Secondary | ICD-10-CM | POA: Insufficient documentation

## 2012-06-16 DIAGNOSIS — I10 Essential (primary) hypertension: Secondary | ICD-10-CM | POA: Insufficient documentation

## 2012-06-16 DIAGNOSIS — Z7982 Long term (current) use of aspirin: Secondary | ICD-10-CM | POA: Insufficient documentation

## 2012-06-16 DIAGNOSIS — E119 Type 2 diabetes mellitus without complications: Secondary | ICD-10-CM | POA: Insufficient documentation

## 2012-06-16 DIAGNOSIS — Z79899 Other long term (current) drug therapy: Secondary | ICD-10-CM | POA: Insufficient documentation

## 2012-06-16 DIAGNOSIS — Z862 Personal history of diseases of the blood and blood-forming organs and certain disorders involving the immune mechanism: Secondary | ICD-10-CM | POA: Insufficient documentation

## 2012-06-16 DIAGNOSIS — Z87891 Personal history of nicotine dependence: Secondary | ICD-10-CM | POA: Insufficient documentation

## 2012-06-16 DIAGNOSIS — J45909 Unspecified asthma, uncomplicated: Secondary | ICD-10-CM | POA: Insufficient documentation

## 2012-06-16 DIAGNOSIS — G40909 Epilepsy, unspecified, not intractable, without status epilepticus: Secondary | ICD-10-CM | POA: Insufficient documentation

## 2012-06-16 DIAGNOSIS — Z8719 Personal history of other diseases of the digestive system: Secondary | ICD-10-CM | POA: Insufficient documentation

## 2012-06-16 DIAGNOSIS — Z8701 Personal history of pneumonia (recurrent): Secondary | ICD-10-CM | POA: Insufficient documentation

## 2012-06-16 DIAGNOSIS — E7800 Pure hypercholesterolemia, unspecified: Secondary | ICD-10-CM | POA: Insufficient documentation

## 2012-06-16 DIAGNOSIS — K219 Gastro-esophageal reflux disease without esophagitis: Secondary | ICD-10-CM | POA: Insufficient documentation

## 2012-06-16 DIAGNOSIS — J40 Bronchitis, not specified as acute or chronic: Secondary | ICD-10-CM | POA: Insufficient documentation

## 2012-06-16 DIAGNOSIS — Z9851 Tubal ligation status: Secondary | ICD-10-CM | POA: Insufficient documentation

## 2012-06-16 DIAGNOSIS — K920 Hematemesis: Secondary | ICD-10-CM | POA: Insufficient documentation

## 2012-06-16 DIAGNOSIS — E039 Hypothyroidism, unspecified: Secondary | ICD-10-CM | POA: Insufficient documentation

## 2012-06-16 DIAGNOSIS — F259 Schizoaffective disorder, unspecified: Secondary | ICD-10-CM | POA: Insufficient documentation

## 2012-06-16 DIAGNOSIS — Z9861 Coronary angioplasty status: Secondary | ICD-10-CM | POA: Insufficient documentation

## 2012-06-16 DIAGNOSIS — G8929 Other chronic pain: Secondary | ICD-10-CM | POA: Insufficient documentation

## 2012-06-16 DIAGNOSIS — Z8679 Personal history of other diseases of the circulatory system: Secondary | ICD-10-CM | POA: Insufficient documentation

## 2012-06-16 LAB — POCT I-STAT TROPONIN I: Troponin i, poc: 0 ng/mL (ref 0.00–0.08)

## 2012-06-16 LAB — CBC
Hemoglobin: 13.1 g/dL (ref 12.0–15.0)
MCH: 32.9 pg (ref 26.0–34.0)
MCV: 103 fL — ABNORMAL HIGH (ref 78.0–100.0)
Platelets: 143 10*3/uL — ABNORMAL LOW (ref 150–400)
RBC: 3.98 MIL/uL (ref 3.87–5.11)
WBC: 6 10*3/uL (ref 4.0–10.5)

## 2012-06-16 LAB — BASIC METABOLIC PANEL
CO2: 35 mEq/L — ABNORMAL HIGH (ref 19–32)
Calcium: 10 mg/dL (ref 8.4–10.5)
Chloride: 96 mEq/L (ref 96–112)
Creatinine, Ser: 0.83 mg/dL (ref 0.50–1.10)
Glucose, Bld: 104 mg/dL — ABNORMAL HIGH (ref 70–99)

## 2012-06-16 MED ORDER — ASPIRIN 325 MG PO TABS: 325.0000 mg | ORAL_TABLET | ORAL | Status: DC

## 2012-06-16 MED ORDER — IOHEXOL 350 MG/ML SOLN
100.0000 mL | Freq: Once | INTRAVENOUS | Status: AC | PRN
  Administered 2012-06-16: 100 mL via INTRAVENOUS

## 2012-06-16 NOTE — ED Notes (Signed)
Report given to [facility] in CDU, pt is to be dc'd home if CT angio is negative. CT is aware pt is to be transported to CDU after Ct angio is completed

## 2012-06-16 NOTE — ED Notes (Signed)
Pt discharged.Vital signs stable and GCS 15 

## 2012-06-16 NOTE — ED Provider Notes (Signed)
Radiology results reviewed, discussed with Dr. Ranae Palms, shared with patient.  Patient discharged back to rehab center with recommendation to follow-up with her PCP.  Jimmye Norman, NP 06/16/12 2011

## 2012-06-16 NOTE — ED Notes (Signed)
MD at bedside. EDP [place]

## 2012-06-16 NOTE — ED Notes (Signed)
Pt to CT

## 2012-06-16 NOTE — ED Provider Notes (Addendum)
History    Patient seen and examined for chest pain. Patient reportedly told staff that she vomited blood today. Staff report that there was no blood seen. Arrival patient still complaining of some chest pain.  CSN: [id number]  Arrival date & time 06/16/12  1428   First MD Initiated Contact with Patient 06/16/12 1452      Chief Complaint  Patient presents with  . Chest Pain  . Hematemesis    (Consider location/radiation/quality/duration/timing/severity/associated sxs/prior treatment) HPI  Past Medical History  Diagnosis Date  . Schizoaffective disorder   . Urinary tract infection   . Hypertension   . GERD (gastroesophageal reflux disease)   . Hypothyroidism   . Obesity   . Asthma   . COPD (chronic obstructive pulmonary disease)   . CHF (congestive heart failure)   . Seizures   . Morbid obesity   . Chronic pain   . Hypercholesteremia   . Anginal pain   . Myocardial infarction 2002  . Pneumonia     "several times" (05/23/2012)  . Diabetes mellitus     "I'm not diabetic anymore" (05/23/2012)  . Anemia   . History of blood transfusion 1993    "when I had my hysterectomy" (05/23/2012)  . H/O hiatal hernia   . Daily headache   . Arthritis     "severe; all over my body" (05/23/2012)  . Hypoventilation syndrome     [name] 05/30/2012  . Atrial fibrillation   . Atrial flutter, paroxysmal     [name] 05/30/2012  . Exertional dyspnea   . Shortness of breath     "all the time lately" (05/30/2012)    Past Surgical History  Procedure Date  . Vaginal hysterectomy 1993  . Tubal ligation 1998  . Cholecystectomy ?2008  . Cardiac catheterization 2008    Family History  Problem Relation Age of Onset  . Heart disease Father   . Cancer Mother 49    Breast    History  Substance Use Topics  . Smoking status: Former Smoker -- 2.0 packs/day for 12 years    Types: Cigarettes    Quit date: 06/29/2000  . Smokeless tobacco: Current User    Types: Snuff, Chew     Comment:  05/30/2012 "still have what you gave me last time"  . Alcohol Use: No    OB History    Grav Para Term Preterm Abortions TAB SAB Ect Mult Living                  Review of Systems  Cardiac - chest pain GI - vomiting all other systems reviewed and are negative  Allergies  Food; Penicillins; and Sulfamethoxazole w-trimethoprim  Home Medications   Current Outpatient Rx  Name  Route  Sig  Dispense  Refill  . ALBUTEROL SULFATE HFA 108 (90 BASE) MCG/ACT IN AERS   Inhalation   Inhale 1 puff into the lungs daily. For wheezing         . ASPIRIN EC 81 MG PO TBEC   Oral   Take 81 mg by mouth daily.         . BUDESONIDE 0.25 MG/2ML IN SUSP   Nebulization   Take 0.25 mg by nebulization 2 (two) times daily.         . CHLORPROMAZINE HCL 50 MG PO TABS   Oral   Take 50 mg by mouth 2 (two) times daily.         Marland Kitchen DIVALPROEX SODIUM 500 MG PO TBEC  Oral   Take 1,000 mg by mouth 2 (two) times daily. For bipolar mood disorder.         Marland Kitchen FLUTICASONE PROPIONATE 50 MCG/ACT NA SUSP   Nasal   Place 2 sprays into the nose daily as needed. For nasal congestion         . FOLIC ACID 1 MG PO TABS   Oral   Take 1 mg by mouth daily. For nutritional supplement.         . IBUPROFEN 200 MG PO TABS   Oral   Take 200 mg by mouth daily as needed. For pain         . LEVOTHYROXINE SODIUM 150 MCG PO TABS   Oral   Take 150 mcg by mouth daily. For thyroid disease.         Marland Kitchen METOPROLOL SUCCINATE ER 50 MG PO TB24   Oral   Take 50 mg by mouth daily. Take with or immediately following a meal.         . ADULT MULTIVITAMIN W/MINERALS CH   Oral   Take 1 tablet by mouth daily. For nutritional supplement.         Marland Kitchen PALIPERIDONE ER 6 MG PO TB24   Oral   Take 6 mg by mouth 2 (two) times daily.         Marland Kitchen POLYETHYLENE GLYCOL 3350 PO PACK   Oral   Take 34 g by mouth daily.         . TUBERCULIN PPD 5 UNIT/0.1ML ID SOLN   Intradermal   Inject 0.1 mLs into the skin every  evening. Inject 0.1cc every evening shift every Sunday, Friday for ppd until 06-25-12.  Administer on Friday and read on Sunday         . NITROGLYCERIN 0.4 MG SL SUBL   Sublingual   Place 0.4 mg under the tongue every 5 (five) minutes as needed. For chest pain         . OMEPRAZOLE 20 MG PO CPDR   Oral   Take 20 mg by mouth daily.         . TRAMADOL HCL 50 MG PO TABS   Oral   Take 50 mg by mouth every 6 (six) hours as needed. For pain           BP 133/87  Temp 98.8 F (37.1 C) (Oral)  Resp 24  SpO2 96%  Physical Exam  Gen. - no acute distress HEENT - pupils reactive light accommodation Musculoskeletal - neck supple Pulmonary - lungs clear to auscultation Cardiac - regular rate and rhythm no murmurs Abdomen - soft and benign Neuro - cranial nerves intact, no deficits Skin - warm, no rash Psych - normal mood  ED Course  Procedures (including critical care time)   Date: 06/16/2012  Rate: 72  Rhythm: normal sinus rhythm  QRS Axis: normal  Intervals: normal  ST/T Wave abnormalities: normal  Conduction Disutrbances:none  Narrative Interpretation:   Old EKG Reviewed: unchanged    Labs Reviewed  CBC - Abnormal; Notable for the following:    MCV 103.0 (*)     Platelets 143 (*)     All other components within normal limits  POCT I-STAT TROPONIN I  BASIC METABOLIC PANEL   Dg Chest Port 1 View  06/16/2012  *RADIOLOGY REPORT*  Clinical Data: Chest pain, hematemesis  PORTABLE CHEST - 1 VIEW  Comparison: Chest radiograph 05/30/2012, CT thorax 05/23/2012  Findings: Stable mildly enlarged heart silhouette.  There are low lung volumes.  No effusion, infiltrate, or pneumothorax. Degenerative osteophytosis of the thoracic spine.  IMPRESSION: No acute cardiopulmonary process.   Original Report Authenticated By: Genevive Bi, M.D.      No diagnosis found.    MDM  Patient presents with multiple complaints. Patient had an essentially positive review of systems.  She continually changed her list of complaints during the evaluation. She initially said she was vomiting blood, but had adamantly denied this and said it was bloody sputum that she was coughing up. She initially complained of chest pain but then admitted that it was only when she coughs. She said she had nausea and vomiting, but has not been any vomiting here today. Her workup has been normal to this point. Work was fine. Troponin was normal. EKG showed nothing acute. Patient has not had any vomiting and refused NG tube to see if there is any ongoing GI bleeding. History seems fairly straightforward that she did not have hematemesis, however but rather of hemoptysis. Because of this a CT angiography has been ordered. Test has not been performed. Case will be signed out to the oncoming ER physician followup. If negative patient will be discharged back to the skilled nursing center.        Gilda Crease, MD 06/17/12 [id number]  Gilda Crease, MD 07/04/12 [phone]

## 2012-06-16 NOTE — ED Provider Notes (Signed)
Medical screening examination/treatment/procedure(s) were performed by non-physician practitioner and as supervising physician I was immediately available for consultation/collaboration.   Cathlyn Tersigni, MD 06/16/12 2221 

## 2012-06-16 NOTE — ED Notes (Signed)
Pt. Is in CT , Report received from Victorino Dike, RN

## 2012-06-16 NOTE — ED Notes (Signed)
PT lives at [facility] . TOday pt reported to staff  She vomited blood and had CP. Staff has not seen any blood.

## 2012-06-29 ENCOUNTER — Emergency Department (HOSPITAL_COMMUNITY): Payer: Medicare Other

## 2012-06-29 ENCOUNTER — Emergency Department (HOSPITAL_COMMUNITY)
Admission: EM | Admit: 2012-06-29 | Discharge: 2012-06-29 | Disposition: A | Discharge status: Home / Self Care | Payer: Medicare Other | Attending: Emergency Medicine | Admitting: Emergency Medicine

## 2012-06-29 DIAGNOSIS — IMO0002 Reserved for concepts with insufficient information to code with codable children: Secondary | ICD-10-CM | POA: Insufficient documentation

## 2012-06-29 DIAGNOSIS — E7800 Pure hypercholesterolemia, unspecified: Secondary | ICD-10-CM | POA: Insufficient documentation

## 2012-06-29 DIAGNOSIS — Z7982 Long term (current) use of aspirin: Secondary | ICD-10-CM | POA: Insufficient documentation

## 2012-06-29 DIAGNOSIS — R0602 Shortness of breath: Secondary | ICD-10-CM | POA: Insufficient documentation

## 2012-06-29 DIAGNOSIS — M129 Arthropathy, unspecified: Secondary | ICD-10-CM | POA: Insufficient documentation

## 2012-06-29 DIAGNOSIS — I10 Essential (primary) hypertension: Secondary | ICD-10-CM | POA: Insufficient documentation

## 2012-06-29 DIAGNOSIS — E119 Type 2 diabetes mellitus without complications: Secondary | ICD-10-CM | POA: Insufficient documentation

## 2012-06-29 DIAGNOSIS — Z8719 Personal history of other diseases of the digestive system: Secondary | ICD-10-CM | POA: Insufficient documentation

## 2012-06-29 DIAGNOSIS — R0789 Other chest pain: Secondary | ICD-10-CM | POA: Insufficient documentation

## 2012-06-29 DIAGNOSIS — D509 Iron deficiency anemia, unspecified: Secondary | ICD-10-CM | POA: Insufficient documentation

## 2012-06-29 DIAGNOSIS — E039 Hypothyroidism, unspecified: Secondary | ICD-10-CM | POA: Insufficient documentation

## 2012-06-29 DIAGNOSIS — Z8701 Personal history of pneumonia (recurrent): Secondary | ICD-10-CM | POA: Insufficient documentation

## 2012-06-29 DIAGNOSIS — Z87440 Personal history of urinary (tract) infections: Secondary | ICD-10-CM | POA: Insufficient documentation

## 2012-06-29 DIAGNOSIS — Z79899 Other long term (current) drug therapy: Secondary | ICD-10-CM | POA: Insufficient documentation

## 2012-06-29 DIAGNOSIS — K219 Gastro-esophageal reflux disease without esophagitis: Secondary | ICD-10-CM | POA: Insufficient documentation

## 2012-06-29 DIAGNOSIS — R059 Cough, unspecified: Secondary | ICD-10-CM | POA: Insufficient documentation

## 2012-06-29 DIAGNOSIS — R079 Chest pain, unspecified: Secondary | ICD-10-CM

## 2012-06-29 DIAGNOSIS — Z8709 Personal history of other diseases of the respiratory system: Secondary | ICD-10-CM | POA: Insufficient documentation

## 2012-06-29 DIAGNOSIS — I252 Old myocardial infarction: Secondary | ICD-10-CM | POA: Insufficient documentation

## 2012-06-29 DIAGNOSIS — J4489 Other specified chronic obstructive pulmonary disease: Secondary | ICD-10-CM | POA: Insufficient documentation

## 2012-06-29 DIAGNOSIS — F259 Schizoaffective disorder, unspecified: Secondary | ICD-10-CM | POA: Insufficient documentation

## 2012-06-29 DIAGNOSIS — J449 Chronic obstructive pulmonary disease, unspecified: Secondary | ICD-10-CM | POA: Insufficient documentation

## 2012-06-29 DIAGNOSIS — I4891 Unspecified atrial fibrillation: Secondary | ICD-10-CM | POA: Insufficient documentation

## 2012-06-29 DIAGNOSIS — I509 Heart failure, unspecified: Secondary | ICD-10-CM | POA: Insufficient documentation

## 2012-06-29 DIAGNOSIS — G8929 Other chronic pain: Secondary | ICD-10-CM | POA: Insufficient documentation

## 2012-06-29 DIAGNOSIS — R05 Cough: Secondary | ICD-10-CM | POA: Insufficient documentation

## 2012-06-29 DIAGNOSIS — Z87891 Personal history of nicotine dependence: Secondary | ICD-10-CM | POA: Insufficient documentation

## 2012-06-29 DIAGNOSIS — J45909 Unspecified asthma, uncomplicated: Secondary | ICD-10-CM | POA: Insufficient documentation

## 2012-06-29 DIAGNOSIS — Z8669 Personal history of other diseases of the nervous system and sense organs: Secondary | ICD-10-CM | POA: Insufficient documentation

## 2012-06-29 MED ORDER — NAPROXEN 500 MG PO TABS: 500.0000 mg | ORAL_TABLET | Freq: Two times a day (BID) | ORAL | Status: DC

## 2012-06-29 NOTE — ED Provider Notes (Signed)
History     CSN: [id number]  Arrival date & time 06/29/12  4098   First MD Initiated Contact with Patient 06/29/12 0254      Chief Complaint  Patient presents with  . Chest Pain  . Shortness of Breath    (Consider location/radiation/quality/duration/timing/severity/associated sxs/prior treatment) HPI Comments: 62 year old obese female with a history of multiple medical problems who presents with a complaint of sore throat, nasal congestion, coughing and chest pain. The patient states that over the last several days she felt like she is developing pneumonia with subjective fevers, coughing and a chest pain which is on the left side of her chest. She states this gets worse when she coughs, it is mild at rest and states that she just feels sick like she has a pneumonia. I have evaluated this patient multiple times in the past and a very familiar with her. She is frequently at the emergency department for complaints of chest pain, diffuse body pain and "not feeling well".  Patient is a 62 y.o. female presenting with chest pain and shortness of breath. The history is provided by the patient and medical records.  Chest Pain Primary symptoms include shortness of breath.    Shortness of Breath  Associated symptoms include chest pain and shortness of breath.    Past Medical History  Diagnosis Date  . Schizoaffective disorder   . Urinary tract infection   . Hypertension   . GERD (gastroesophageal reflux disease)   . Hypothyroidism   . Obesity   . Asthma   . COPD (chronic obstructive pulmonary disease)   . CHF (congestive heart failure)   . Seizures   . Morbid obesity   . Chronic pain   . Hypercholesteremia   . Anginal pain   . Myocardial infarction 2002  . Pneumonia     "several times" (05/23/2012)  . Diabetes mellitus     "I'm not diabetic anymore" (05/23/2012)  . Anemia   . History of blood transfusion 1993    "when I had my hysterectomy" (05/23/2012)  . H/O hiatal hernia   .  Daily headache   . Arthritis     "severe; all over my body" (05/23/2012)  . Hypoventilation syndrome     [name] 05/30/2012  . Atrial fibrillation   . Atrial flutter, paroxysmal     [name] 05/30/2012  . Exertional dyspnea   . Shortness of breath     "all the time lately" (05/30/2012)    Past Surgical History  Procedure Date  . Vaginal hysterectomy 1993  . Tubal ligation 1998  . Cholecystectomy ?2008  . Cardiac catheterization 2008    Family History  Problem Relation Age of Onset  . Heart disease Father   . Cancer Mother 20    Breast    History  Substance Use Topics  . Smoking status: Former Smoker -- 2.0 packs/day for 12 years    Types: Cigarettes    Quit date: 06/29/2000  . Smokeless tobacco: Current User    Types: Snuff, Chew     Comment: 05/30/2012 "still have what you gave me last time"  . Alcohol Use: No    OB History    Grav Para Term Preterm Abortions TAB SAB Ect Mult Living                  Review of Systems  Respiratory: Positive for shortness of breath.   Cardiovascular: Positive for chest pain.  All other systems reviewed and are negative.    Allergies  Food; Penicillins; and Sulfamethoxazole w-trimethoprim  Home Medications   Current Outpatient Rx  Name  Route  Sig  Dispense  Refill  . ALBUTEROL SULFATE HFA 108 (90 BASE) MCG/ACT IN AERS   Inhalation   Inhale 1 puff into the lungs daily. For wheezing         . ASPIRIN EC 81 MG PO TBEC   Oral   Take 81 mg by mouth daily.         . BUDESONIDE 0.25 MG/2ML IN SUSP   Nebulization   Take 0.25 mg by nebulization 2 (two) times daily.         . CHLORPROMAZINE HCL 50 MG PO TABS   Oral   Take 50 mg by mouth 2 (two) times daily.         Marland Kitchen DIVALPROEX SODIUM 500 MG PO TBEC   Oral   Take 1,000 mg by mouth 2 (two) times daily. For bipolar mood disorder.         Marland Kitchen FLUTICASONE PROPIONATE 50 MCG/ACT NA SUSP   Nasal   Place 2 sprays into the nose daily as needed. For nasal congestion           . FOLIC ACID 1 MG PO TABS   Oral   Take 1 mg by mouth daily. For nutritional supplement.         . IBUPROFEN 200 MG PO TABS   Oral   Take 200 mg by mouth daily as needed. For pain         . LEVOTHYROXINE SODIUM 150 MCG PO TABS   Oral   Take 150 mcg by mouth daily. For thyroid disease.         Marland Kitchen METOPROLOL SUCCINATE ER 50 MG PO TB24   Oral   Take 50 mg by mouth daily. Take with or immediately following a meal.         . ADULT MULTIVITAMIN W/MINERALS CH   Oral   Take 1 tablet by mouth daily. For nutritional supplement.         Marland Kitchen OMEPRAZOLE 20 MG PO CPDR   Oral   Take 20 mg by mouth daily.         Marland Kitchen PALIPERIDONE ER 6 MG PO TB24   Oral   Take 6 mg by mouth 2 (two) times daily.         Marland Kitchen POLYETHYLENE GLYCOL 3350 PO PACK   Oral   Take 34 g by mouth daily.         . TRAMADOL HCL 50 MG PO TABS   Oral   Take 50 mg by mouth every 6 (six) hours as needed. For pain         . NAPROXEN 500 MG PO TABS   Oral   Take 1 tablet (500 mg total) by mouth 2 (two) times daily with a meal.   30 tablet   0   . NITROGLYCERIN 0.4 MG SL SUBL   Sublingual   Place 0.4 mg under the tongue every 5 (five) minutes as needed. For chest pain           BP 108/70  Pulse 84  Temp 98.4 F (36.9 C) (Oral)  Resp 16  SpO2 98%  Physical Exam  Nursing note and vitals reviewed. Constitutional: She appears well-developed and well-nourished. No distress.  HENT:  Head: Normocephalic and atraumatic.  Mouth/Throat: Oropharynx is clear and moist. No oropharyngeal exudate.  Eyes: Conjunctivae normal and EOM are normal. Pupils are equal,  round, and reactive to light. Right eye exhibits no discharge. Left eye exhibits no discharge. No scleral icterus.  Neck: Normal range of motion. Neck supple. No JVD present. No thyromegaly present.  Cardiovascular: Normal rate, regular rhythm, normal heart sounds and intact distal pulses.  Exam reveals no gallop and no friction rub.   No murmur  heard. Pulmonary/Chest: Effort normal and breath sounds normal. No respiratory distress. She has no wheezes. She has no rales.  Abdominal: Soft. Bowel sounds are normal. She exhibits no distension and no mass. There is no tenderness.  Musculoskeletal: Normal range of motion. She exhibits no edema and no tenderness.  Lymphadenopathy:    She has no cervical adenopathy.  Neurological: She is alert. Coordination normal.  Skin: Skin is warm and dry. No rash noted. No erythema.  Psychiatric: She has a normal mood and affect. Her behavior is normal.    ED Course  Procedures (including critical care time)  Labs Reviewed - No data to display Dg Chest 2 View  06/29/2012  *RADIOLOGY REPORT*  Clinical Data: Acute onset chest pain and shortness of breath.  CHEST - 2 VIEW  Comparison: CT and plain film of the chest 06/16/2012.  Findings: There is cardiomegaly but no edema.  Lungs clear.  No pneumothorax or pleural effusion.  IMPRESSION: Cardiomegaly without acute disease.   Original Report Authenticated By: Holley Dexter, M.D.      1. Chest pain   2. Cough       MDM  The patient is morbidly obese but has normal vital signs, normal heart sounds, normal lung sounds and no evidence of toxicity. Her oropharynx is clear and moist, there is no exudate asymmetry hypertrophy or erythema and no nasal congestion. Her lower strumming these are both swollen and mildly edematous and this is baseline for the patient. I will order a chest x-ray to rule out a pulmonary infiltrate however I doubt that the patient's pain is related to a cardiac source, or a pulmonary embolism. It seems to be related to upper respiratory symptoms and coughing.  ED ECG REPORT  I personally interpreted this EKG   Date: 06/29/2012   Rate: 80  Rhythm: normal sinus rhythm  QRS Axis: normal  Intervals: normal  ST/T Wave abnormalities: nonspecific T wave changes  Conduction Disutrbances:none  Narrative Interpretation: PRWP -  unchanged since prior  Old EKG Reviewed: improved since 05/2012  CXR negative for acute findings - VS normal and reassuring (sat's 96-98% on ra)- pt to be d/c home.     Vida Roller, MD 06/29/12 [phone]

## 2012-06-29 NOTE — ED Notes (Signed)
The patient advised EMS that she started experiencing sharp, chest pain that felt like someone was "cutting her chest" @ 1000.

## 2012-06-29 NOTE — ED Notes (Signed)
The patient is AOx4 and comfortable with her discharge instructions. 

## 2012-06-29 NOTE — ED Notes (Signed)
MD at bedside. 

## 2012-06-29 NOTE — ED Notes (Addendum)
The patient received ASA 324 mg, po and nitro-stat 0.4 mg, sL x 2 en route.  She rated her pain as 9/10 prior to and post the nitro administration, but EMS did not note any observable acute distress.

## 2012-07-03 ENCOUNTER — Emergency Department (HOSPITAL_COMMUNITY)
Admission: EM | Admit: 2012-07-03 | Discharge: 2012-07-03 | Disposition: A | Discharge status: Home / Self Care | Payer: Medicare Other | Attending: Emergency Medicine | Admitting: Emergency Medicine

## 2012-07-03 ENCOUNTER — Encounter (HOSPITAL_COMMUNITY): Payer: Self-pay | Admitting: Emergency Medicine

## 2012-07-03 DIAGNOSIS — Z87440 Personal history of urinary (tract) infections: Secondary | ICD-10-CM | POA: Insufficient documentation

## 2012-07-03 DIAGNOSIS — Z7982 Long term (current) use of aspirin: Secondary | ICD-10-CM | POA: Insufficient documentation

## 2012-07-03 DIAGNOSIS — J45909 Unspecified asthma, uncomplicated: Secondary | ICD-10-CM | POA: Insufficient documentation

## 2012-07-03 DIAGNOSIS — F259 Schizoaffective disorder, unspecified: Secondary | ICD-10-CM | POA: Insufficient documentation

## 2012-07-03 DIAGNOSIS — J449 Chronic obstructive pulmonary disease, unspecified: Secondary | ICD-10-CM | POA: Insufficient documentation

## 2012-07-03 DIAGNOSIS — Z8709 Personal history of other diseases of the respiratory system: Secondary | ICD-10-CM | POA: Insufficient documentation

## 2012-07-03 DIAGNOSIS — Z87891 Personal history of nicotine dependence: Secondary | ICD-10-CM | POA: Insufficient documentation

## 2012-07-03 DIAGNOSIS — Z8679 Personal history of other diseases of the circulatory system: Secondary | ICD-10-CM | POA: Insufficient documentation

## 2012-07-03 DIAGNOSIS — Z8719 Personal history of other diseases of the digestive system: Secondary | ICD-10-CM | POA: Insufficient documentation

## 2012-07-03 DIAGNOSIS — G40909 Epilepsy, unspecified, not intractable, without status epilepticus: Secondary | ICD-10-CM | POA: Insufficient documentation

## 2012-07-03 DIAGNOSIS — K219 Gastro-esophageal reflux disease without esophagitis: Secondary | ICD-10-CM | POA: Insufficient documentation

## 2012-07-03 DIAGNOSIS — I252 Old myocardial infarction: Secondary | ICD-10-CM | POA: Insufficient documentation

## 2012-07-03 DIAGNOSIS — Z862 Personal history of diseases of the blood and blood-forming organs and certain disorders involving the immune mechanism: Secondary | ICD-10-CM | POA: Insufficient documentation

## 2012-07-03 DIAGNOSIS — E119 Type 2 diabetes mellitus without complications: Secondary | ICD-10-CM | POA: Insufficient documentation

## 2012-07-03 DIAGNOSIS — G8929 Other chronic pain: Secondary | ICD-10-CM | POA: Insufficient documentation

## 2012-07-03 DIAGNOSIS — I10 Essential (primary) hypertension: Secondary | ICD-10-CM | POA: Insufficient documentation

## 2012-07-03 DIAGNOSIS — J4489 Other specified chronic obstructive pulmonary disease: Secondary | ICD-10-CM | POA: Insufficient documentation

## 2012-07-03 DIAGNOSIS — Z8701 Personal history of pneumonia (recurrent): Secondary | ICD-10-CM | POA: Insufficient documentation

## 2012-07-03 DIAGNOSIS — E7800 Pure hypercholesterolemia, unspecified: Secondary | ICD-10-CM | POA: Insufficient documentation

## 2012-07-03 DIAGNOSIS — E039 Hypothyroidism, unspecified: Secondary | ICD-10-CM | POA: Insufficient documentation

## 2012-07-03 DIAGNOSIS — I509 Heart failure, unspecified: Secondary | ICD-10-CM | POA: Insufficient documentation

## 2012-07-03 DIAGNOSIS — Z79899 Other long term (current) drug therapy: Secondary | ICD-10-CM | POA: Insufficient documentation

## 2012-07-03 DIAGNOSIS — Z8739 Personal history of other diseases of the musculoskeletal system and connective tissue: Secondary | ICD-10-CM | POA: Insufficient documentation

## 2012-07-03 LAB — POCT I-STAT, CHEM 8
Calcium, Ion: 1.3 mmol/L — ABNORMAL HIGH (ref 1.12–1.23)
Chloride: 94 mEq/L — ABNORMAL LOW (ref 96–112)
HCT: 45 % (ref 36.0–46.0)
Sodium: 140 mEq/L (ref 135–145)
TCO2: 34 mmol/L (ref 0–100)

## 2012-07-03 MED ORDER — BUPIVACAINE HCL 0.5 % IJ SOLN: 50.0000 mL | Freq: Once | INTRAMUSCULAR | Status: DC

## 2012-07-03 MED ORDER — POTASSIUM CHLORIDE CRYS ER 20 MEQ PO TBCR
40.0000 meq | EXTENDED_RELEASE_TABLET | Freq: Once | ORAL | Status: AC
  Administered 2012-07-03: 40 meq via ORAL
  Filled 2012-07-03: qty 2

## 2012-07-03 MED ORDER — ONDANSETRON 8 MG PO TBDP: 8.0000 mg | ORAL_TABLET | Freq: Three times a day (TID) | ORAL | Status: DC | PRN

## 2012-07-03 MED ORDER — ONDANSETRON 4 MG PO TBDP
4.0000 mg | ORAL_TABLET | Freq: Once | ORAL | Status: AC
  Administered 2012-07-03: 4 mg via ORAL
  Filled 2012-07-03: qty 1

## 2012-07-03 MED ORDER — LIDOCAINE HCL 2 % IJ SOLN: 10.0000 mL | Freq: Once | INTRAMUSCULAR | Status: DC

## 2012-07-03 NOTE — ED Provider Notes (Signed)
History     CSN: [id number]  Arrival date & time 07/03/12  1108   First MD Initiated Contact with Patient 07/03/12 1120      Chief Complaint  Patient presents with  . Generalized Body Aches    (Consider location/radiation/quality/duration/timing/severity/associated sxs/prior treatment) HPI Patient presents to the emergency room with complaints of generalized pain throughout her body. The symptoms started several days ago. She also has been having trouble with cough, weakness nausea and fatigue. She states she has been having episodes of vomiting and diarrhea. The patient has a history of multiple recurrent episodes of similar symptoms. She has been to the emergency department many times in the past for this. She was last seen on January 10 for similar complaints. Of note, the patient had been in the hospital and in a rehabilitation facility. She was discharged from there and is now residing at home again on her own.  Patient denies any specific chest pain. Past Medical History  Diagnosis Date  . Schizoaffective disorder   . Urinary tract infection   . Hypertension   . GERD (gastroesophageal reflux disease)   . Hypothyroidism   . Obesity   . Asthma   . COPD (chronic obstructive pulmonary disease)   . CHF (congestive heart failure)   . Seizures   . Morbid obesity   . Chronic pain   . Hypercholesteremia   . Anginal pain   . Myocardial infarction 2002  . Pneumonia     "several times" (05/23/2012)  . Diabetes mellitus     "I'm not diabetic anymore" (05/23/2012)  . Anemia   . History of blood transfusion 1993    "when I had my hysterectomy" (05/23/2012)  . H/O hiatal hernia   . Daily headache   . Arthritis     "severe; all over my body" (05/23/2012)  . Hypoventilation syndrome     [name] 05/30/2012  . Atrial fibrillation   . Atrial flutter, paroxysmal     [name] 05/30/2012  . Exertional dyspnea   . Shortness of breath     "all the time lately" (05/30/2012)    Past  Surgical History  Procedure Date  . Vaginal hysterectomy 1993  . Tubal ligation 1998  . Cholecystectomy ?2008  . Cardiac catheterization 2008    Family History  Problem Relation Age of Onset  . Heart disease Father   . Cancer Mother 72    Breast    History  Substance Use Topics  . Smoking status: Former Smoker -- 2.0 packs/day for 12 years    Types: Cigarettes    Quit date: 06/29/2000  . Smokeless tobacco: Current User    Types: Snuff, Chew     Comment: 05/30/2012 "still have what you gave me last time"  . Alcohol Use: No    OB History    Grav Para Term Preterm Abortions TAB SAB Ect Mult Living                  Review of Systems  All other systems reviewed and are negative.    Allergies  Food; Penicillins; and Sulfamethoxazole w-trimethoprim  Home Medications   Current Outpatient Rx  Name  Route  Sig  Dispense  Refill  . ALBUTEROL SULFATE HFA 108 (90 BASE) MCG/ACT IN AERS   Inhalation   Inhale 1 puff into the lungs daily. For wheezing         . ASPIRIN EC 81 MG PO TBEC   Oral   Take 81 mg by mouth  daily.         . BUDESONIDE 0.25 MG/2ML IN SUSP   Nebulization   Take 0.25 mg by nebulization 2 (two) times daily.         . CHLORPROMAZINE HCL 50 MG PO TABS   Oral   Take 50 mg by mouth 2 (two) times daily.         Marland Kitchen DIVALPROEX SODIUM 500 MG PO TBEC   Oral   Take 1,000 mg by mouth 2 (two) times daily. For bipolar mood disorder.         Marland Kitchen FLUTICASONE PROPIONATE 50 MCG/ACT NA SUSP   Nasal   Place 2 sprays into the nose daily as needed. For nasal congestion         . FOLIC ACID 1 MG PO TABS   Oral   Take 1 mg by mouth daily. For nutritional supplement.         . IBUPROFEN 200 MG PO TABS   Oral   Take 200 mg by mouth daily as needed. For pain         . LEVOTHYROXINE SODIUM 150 MCG PO TABS   Oral   Take 150 mcg by mouth daily. For thyroid disease.         Marland Kitchen METOPROLOL SUCCINATE ER 50 MG PO TB24   Oral   Take 50 mg by mouth  daily. Take with or immediately following a meal.         . ADULT MULTIVITAMIN W/MINERALS CH   Oral   Take 1 tablet by mouth daily. For nutritional supplement.         Marland Kitchen NAPROXEN 500 MG PO TABS   Oral   Take 1 tablet (500 mg total) by mouth 2 (two) times daily with a meal.   30 tablet   0   . NITROGLYCERIN 0.4 MG SL SUBL   Sublingual   Place 0.4 mg under the tongue every 5 (five) minutes as needed. For chest pain         . OMEPRAZOLE 20 MG PO CPDR   Oral   Take 20 mg by mouth daily.         Marland Kitchen PALIPERIDONE ER 6 MG PO TB24   Oral   Take 6 mg by mouth 2 (two) times daily.         Marland Kitchen POLYETHYLENE GLYCOL 3350 PO PACK   Oral   Take 34 g by mouth daily.         . TRAMADOL HCL 50 MG PO TABS   Oral   Take 50 mg by mouth every 6 (six) hours as needed. For pain           BP 109/71  Pulse 91  Temp 98.8 F (37.1 C) (Oral)  Resp 18  SpO2 100%  Physical Exam  Nursing note and vitals reviewed. Constitutional: No distress.       Morbidly obese  HENT:  Head: Normocephalic and atraumatic.  Right Ear: External ear normal.  Left Ear: External ear normal.  Eyes: Conjunctivae normal are normal. Right eye exhibits no discharge. Left eye exhibits no discharge. No scleral icterus.  Neck: Neck supple. No tracheal deviation present.  Cardiovascular: Normal rate, regular rhythm and intact distal pulses.   Pulmonary/Chest: Effort normal and breath sounds normal. No stridor. No respiratory distress. She has no wheezes. She has no rales.  Abdominal: Soft. Bowel sounds are normal. She exhibits no distension. There is no tenderness. There is no rebound and no  guarding.  Musculoskeletal: She exhibits no edema and no tenderness.       Patient's extremities do not appear edematous, the swelling that she mentions appears to be from adipose tissue  Neurological: She is alert. She has normal strength. No sensory deficit. Cranial nerve deficit:  no gross defecits noted. She exhibits  normal muscle tone. She displays no seizure activity. Coordination normal.  Skin: Skin is warm and dry. No rash noted. She is not diaphoretic.  Psychiatric: Her affect is blunt.    ED Course  Procedures (including critical care time)  Rate: 88  Rhythm: normal sinus rhythm  QRS Axis: normal  Intervals: normal  ST/T Wave abnormalities: nonspecific t wave abnormalities  Narrative Interpretation: No acute ischemic changes  Old EKG Reviewed: No significant changes  Labs Reviewed  POCT I-STAT, CHEM 8 - Abnormal; Notable for the following:    Potassium 3.3 (*)     Chloride 94 (*)     BUN <3 (*)     Glucose, Bld 117 (*)     Calcium, Ion 1.30 (*)     Hemoglobin 15.3 (*)     All other components within normal limits   No results found.   1. Chronic pain       MDM  Patient does not have evidence of significant dehydration. I reviewed her prior evaluations and she just had a normal chest x-ray within the last week. Her EKG is unremarkable.  Her symptoms are very similar to her prior visits to the emergency department. At this time, I do not feel there is evidence to suggest an acute emergency medical condition. I think the patient is stable for appropriate outpatient followup        Celene Kras, MD 07/03/12 1244

## 2012-07-03 NOTE — ED Notes (Signed)
Pt here via EMS with multiple complaints. Weakness, nausea, fatigue. Pt recently d/c from hospital and d/c from [facility].

## 2012-07-03 NOTE — ED Notes (Signed)
Pt undressed, in gown, on monitor, continuous pulse oximetry and blood pressure cuff 

## 2012-07-15 ENCOUNTER — Emergency Department (HOSPITAL_COMMUNITY)
Admission: EM | Admit: 2012-07-15 | Discharge: 2012-07-15 | Disposition: A | Discharge status: Home / Self Care | Payer: Medicare Other | Attending: Emergency Medicine | Admitting: Emergency Medicine

## 2012-07-15 ENCOUNTER — Encounter (HOSPITAL_COMMUNITY): Payer: Self-pay | Admitting: *Deleted

## 2012-07-15 DIAGNOSIS — E039 Hypothyroidism, unspecified: Secondary | ICD-10-CM | POA: Insufficient documentation

## 2012-07-15 DIAGNOSIS — M129 Arthropathy, unspecified: Secondary | ICD-10-CM | POA: Insufficient documentation

## 2012-07-15 DIAGNOSIS — I252 Old myocardial infarction: Secondary | ICD-10-CM | POA: Insufficient documentation

## 2012-07-15 DIAGNOSIS — Z8669 Personal history of other diseases of the nervous system and sense organs: Secondary | ICD-10-CM | POA: Insufficient documentation

## 2012-07-15 DIAGNOSIS — E7800 Pure hypercholesterolemia, unspecified: Secondary | ICD-10-CM | POA: Insufficient documentation

## 2012-07-15 DIAGNOSIS — Z79899 Other long term (current) drug therapy: Secondary | ICD-10-CM | POA: Insufficient documentation

## 2012-07-15 DIAGNOSIS — Z791 Long term (current) use of non-steroidal anti-inflammatories (NSAID): Secondary | ICD-10-CM | POA: Insufficient documentation

## 2012-07-15 DIAGNOSIS — Z862 Personal history of diseases of the blood and blood-forming organs and certain disorders involving the immune mechanism: Secondary | ICD-10-CM | POA: Insufficient documentation

## 2012-07-15 DIAGNOSIS — Z7982 Long term (current) use of aspirin: Secondary | ICD-10-CM | POA: Insufficient documentation

## 2012-07-15 DIAGNOSIS — IMO0002 Reserved for concepts with insufficient information to code with codable children: Secondary | ICD-10-CM | POA: Insufficient documentation

## 2012-07-15 DIAGNOSIS — J4489 Other specified chronic obstructive pulmonary disease: Secondary | ICD-10-CM | POA: Insufficient documentation

## 2012-07-15 DIAGNOSIS — Z8719 Personal history of other diseases of the digestive system: Secondary | ICD-10-CM | POA: Insufficient documentation

## 2012-07-15 DIAGNOSIS — Z8701 Personal history of pneumonia (recurrent): Secondary | ICD-10-CM | POA: Insufficient documentation

## 2012-07-15 DIAGNOSIS — M79606 Pain in leg, unspecified: Secondary | ICD-10-CM

## 2012-07-15 DIAGNOSIS — E119 Type 2 diabetes mellitus without complications: Secondary | ICD-10-CM | POA: Insufficient documentation

## 2012-07-15 DIAGNOSIS — Z87440 Personal history of urinary (tract) infections: Secondary | ICD-10-CM | POA: Insufficient documentation

## 2012-07-15 DIAGNOSIS — I10 Essential (primary) hypertension: Secondary | ICD-10-CM | POA: Insufficient documentation

## 2012-07-15 DIAGNOSIS — J449 Chronic obstructive pulmonary disease, unspecified: Secondary | ICD-10-CM | POA: Insufficient documentation

## 2012-07-15 DIAGNOSIS — Z87891 Personal history of nicotine dependence: Secondary | ICD-10-CM | POA: Insufficient documentation

## 2012-07-15 DIAGNOSIS — K219 Gastro-esophageal reflux disease without esophagitis: Secondary | ICD-10-CM | POA: Insufficient documentation

## 2012-07-15 DIAGNOSIS — G8929 Other chronic pain: Secondary | ICD-10-CM | POA: Insufficient documentation

## 2012-07-15 DIAGNOSIS — F209 Schizophrenia, unspecified: Secondary | ICD-10-CM | POA: Insufficient documentation

## 2012-07-15 DIAGNOSIS — Z8679 Personal history of other diseases of the circulatory system: Secondary | ICD-10-CM | POA: Insufficient documentation

## 2012-07-15 DIAGNOSIS — I509 Heart failure, unspecified: Secondary | ICD-10-CM | POA: Insufficient documentation

## 2012-07-15 MED ORDER — OXYCODONE-ACETAMINOPHEN 5-325 MG PO TABS
2.0000 | ORAL_TABLET | Freq: Once | ORAL | Status: AC
  Administered 2012-07-15: 2 via ORAL
  Filled 2012-07-15: qty 2

## 2012-07-15 NOTE — ED Notes (Signed)
Report given by Foy Guadalajara, RN

## 2012-07-15 NOTE — ED Notes (Signed)
PA student at bedside to speak with pt

## 2012-07-15 NOTE — Discharge Instructions (Signed)
It is important to elevate your legs. Follow up with Dr. Sharyn Lull. Musculoskeletal Pain Musculoskeletal pain is muscle and boney aches and pains. These pains can occur in any part of the body. Your caregiver may treat you without knowing the cause of the pain. They may treat you if blood or urine tests, X-rays, and other tests were normal.  CAUSES There is often not a definite cause or reason for these pains. These pains may be caused by a type of germ (virus). The discomfort may also come from overuse. Overuse includes working out too hard when your body is not fit. Boney aches also come from weather changes. Bone is sensitive to atmospheric pressure changes. HOME CARE INSTRUCTIONS   Ask when your test results will be ready. Make sure you get your test results.  Only take over-the-counter or prescription medicines for pain, discomfort, or fever as directed by your caregiver. If you were given medications for your condition, do not drive, operate machinery or power tools, or sign legal documents for 24 hours. Do not drink alcohol. Do not take sleeping pills or other medications that may interfere with treatment.  Continue all activities unless the activities cause more pain. When the pain lessens, slowly resume normal activities. Gradually increase the intensity and duration of the activities or exercise.  During periods of severe pain, bed rest may be helpful. Lay or sit in any position that is comfortable.  Putting ice on the injured area.  Put ice in a bag.  Place a towel between your skin and the bag.  Leave the ice on for 15 to 20 minutes, 3 to 4 times a day.  Follow up with your caregiver for continued problems and no reason can be found for the pain. If the pain becomes worse or does not go away, it may be necessary to repeat tests or do additional testing. Your caregiver may need to look further for a possible cause. SEEK IMMEDIATE MEDICAL CARE IF:  You have pain that is getting  worse and is not relieved by medications.  You develop chest pain that is associated with shortness or breath, sweating, feeling sick to your stomach (nauseous), or throw up (vomit).  Your pain becomes localized to the abdomen.  You develop any new symptoms that seem different or that concern you. MAKE SURE YOU:   Understand these instructions.  Will watch your condition.  Will get help right away if you are not doing well or get worse. Document Released: 06/06/2005 Document Revised: 08/29/2011 Document Reviewed: 01/25/2008 [facility] Patient Information 2013 [employer] [place], [place].

## 2012-07-15 NOTE — ED Notes (Signed)
Pt. Requested a diet Coke, given. Raised the HOB to enhance her breathing.

## 2012-07-15 NOTE — ED Notes (Signed)
Ordered breakfast tray for pt  

## 2012-07-15 NOTE — ED Notes (Signed)
Pt. Ate 100% of breakfast. Pt. C/o not being able to walk and wanting to stay at the hospital.

## 2012-07-15 NOTE — ED Notes (Signed)
[facility] to pick pt. Up to transfer back to her residence.

## 2012-07-15 NOTE — ED Notes (Signed)
Per EMS: EMS was called out 4 times in 24 hours.  Pt states she has been laying in bed x 2 weeks.  C/o left leg pain x 2 days.  Hypertensive 168/106.

## 2012-07-15 NOTE — ED Provider Notes (Signed)
History     CSN: [id number]  Arrival date & time 07/15/12  0548   First MD Initiated Contact with Patient 07/15/12 [phone]      Chief Complaint  Patient presents with  . Leg Pain    (Consider location/radiation/quality/duration/timing/severity/associated sxs/prior treatment) HPI Comments: 62 y/o morbidly obese female with a history of chronic pain, schizoaffective disorder, CHF and multiple other medical problems presents to the ED complaining of bilateral lower leg pain x 2 days. She arrived via EMS who she called 4 times in 24 hours since she refused to get into the truck at first. States her fiance was helping her into the bed when she fell onto her knees. Patient told triage her L knee is painful, told the PA student her L knee is painful, and told myself both legs were painful. Pain described as dull and achy, slowly worsening to 10/0. Pain aggravated by walking. She tried taking tylenol and tramadol without relief. She is asking for coffee, diet coke and something to eat.  The history is provided by the patient.    Past Medical History  Diagnosis Date  . Schizoaffective disorder   . Urinary tract infection   . Hypertension   . GERD (gastroesophageal reflux disease)   . Hypothyroidism   . Obesity   . Asthma   . COPD (chronic obstructive pulmonary disease)   . CHF (congestive heart failure)   . Seizures   . Morbid obesity   . Chronic pain   . Hypercholesteremia   . Anginal pain   . Myocardial infarction 2002  . Pneumonia     "several times" (05/23/2012)  . Diabetes mellitus     "I'm not diabetic anymore" (05/23/2012)  . Anemia   . History of blood transfusion 1993    "when I had my hysterectomy" (05/23/2012)  . H/O hiatal hernia   . Daily headache   . Arthritis     "severe; all over my body" (05/23/2012)  . Hypoventilation syndrome     [name] 05/30/2012  . Atrial fibrillation   . Atrial flutter, paroxysmal     [name] 05/30/2012  . Exertional dyspnea   . Shortness of  breath     "all the time lately" (05/30/2012)    Past Surgical History  Procedure Date  . Vaginal hysterectomy 1993  . Tubal ligation 1998  . Cholecystectomy ?2008  . Cardiac catheterization 2008    Family History  Problem Relation Age of Onset  . Heart disease Father   . Cancer Mother 47    Breast    History  Substance Use Topics  . Smoking status: Former Smoker -- 2.0 packs/day for 12 years    Types: Cigarettes    Quit date: 06/29/2000  . Smokeless tobacco: Current User    Types: Snuff, Chew     Comment: 05/30/2012 "still have what you gave me last time"  . Alcohol Use: No    OB History    Grav Para Term Preterm Abortions TAB SAB Ect Mult Living                  Review of Systems  Constitutional: Negative for fever and chills.  Musculoskeletal: Positive for myalgias, arthralgias and gait problem.  Skin: Negative for wound.  Neurological: Negative for numbness.  All other systems reviewed and are negative.    Allergies  Food; Penicillins; and Sulfamethoxazole w-trimethoprim  Home Medications   Current Outpatient Rx  Name  Route  Sig  Dispense  Refill  .  ALBUTEROL SULFATE HFA 108 (90 BASE) MCG/ACT IN AERS   Inhalation   Inhale 1 puff into the lungs daily. For wheezing         . ASPIRIN EC 81 MG PO TBEC   Oral   Take 81 mg by mouth daily.         . BUDESONIDE 0.25 MG/2ML IN SUSP   Nebulization   Take 0.25 mg by nebulization 2 (two) times daily.         . CHLORPROMAZINE HCL 50 MG PO TABS   Oral   Take 50 mg by mouth 2 (two) times daily.         Marland Kitchen DIVALPROEX SODIUM 500 MG PO TBEC   Oral   Take 1,000 mg by mouth 2 (two) times daily. For bipolar mood disorder.         Marland Kitchen FLUTICASONE PROPIONATE 50 MCG/ACT NA SUSP   Nasal   Place 2 sprays into the nose daily as needed. For nasal congestion         . FOLIC ACID 1 MG PO TABS   Oral   Take 1 mg by mouth daily. For nutritional supplement.         . IBUPROFEN 200 MG PO TABS   Oral    Take 200 mg by mouth daily as needed. For pain         . LEVOTHYROXINE SODIUM 150 MCG PO TABS   Oral   Take 150 mcg by mouth daily. For thyroid disease.         Marland Kitchen METOPROLOL SUCCINATE ER 50 MG PO TB24   Oral   Take 50 mg by mouth daily. Take with or immediately following a meal.         . ADULT MULTIVITAMIN W/MINERALS CH   Oral   Take 1 tablet by mouth daily. For nutritional supplement.         Marland Kitchen NAPROXEN 500 MG PO TABS   Oral   Take 1 tablet (500 mg total) by mouth 2 (two) times daily with a meal.   30 tablet   0   . NITROGLYCERIN 0.4 MG SL SUBL   Sublingual   Place 0.4 mg under the tongue every 5 (five) minutes as needed. For chest pain         . OMEPRAZOLE 20 MG PO CPDR   Oral   Take 20 mg by mouth daily.         Marland Kitchen ONDANSETRON 8 MG PO TBDP   Oral   Take 1 tablet (8 mg total) by mouth every 8 (eight) hours as needed for nausea.   20 tablet   0   . PALIPERIDONE ER 6 MG PO TB24   Oral   Take 6 mg by mouth 2 (two) times daily.         Marland Kitchen POLYETHYLENE GLYCOL 3350 PO PACK   Oral   Take 34 g by mouth daily.         . TRAMADOL HCL 50 MG PO TABS   Oral   Take 50 mg by mouth every 6 (six) hours as needed. For pain           BP 139/82  Temp 98.3 F (36.8 C) (Oral)  Resp 20  SpO2 98%  Physical Exam  Nursing note and vitals reviewed. Constitutional: She is oriented to person, place, and time. She appears well-developed. No distress.       Morbidly obese.  HENT:  Head: Normocephalic and atraumatic.  Eyes: Conjunctivae normal are normal.  Neck: Normal range of motion. Neck supple.  Cardiovascular: Normal rate, regular rhythm and normal heart sounds.        Non pitting edema LE bilateral.  Pulmonary/Chest: Effort normal and breath sounds normal.  Musculoskeletal:       Right knee: Normal.       Left knee: Normal.       Right lower leg: She exhibits tenderness (generalized). She exhibits no deformity.       Left lower leg: She exhibits  tenderness (generalized). She exhibits no deformity.  Neurological: She is alert and oriented to person, place, and time. No sensory deficit.  Skin: Skin is warm and dry.  Psychiatric: Her mood appears anxious. Her speech is tangential. She is slowed.    ED Course  Procedures (including critical care time)  Labs Reviewed - No data to display No results found.   1. Leg pain   2. Chronic pain       MDM  No significant finding on exam concerning patient's leg pain. She continues to ask to stay in the hospital and does not want to go home since she cannot walk on her legs. Percocet given. Patient states it did not "touch" her pain and would like a coffee and something to eat. Will try to walk patient. 9:44 AM Patient ate breakfast and got up to walk. She is stable for discharge.        Trevor Mace, PA-C 07/15/12 0945

## 2012-07-15 NOTE — ED Provider Notes (Signed)
Medical screening examination/treatment/procedure(s) were performed by non-physician practitioner and as supervising physician I was immediately available for consultation/collaboration.   Hanley Seamen, MD 07/15/12 2329

## 2012-07-17 ENCOUNTER — Encounter (HOSPITAL_COMMUNITY): Payer: Self-pay | Admitting: Emergency Medicine

## 2012-07-17 ENCOUNTER — Emergency Department (HOSPITAL_COMMUNITY)
Admission: EM | Admit: 2012-07-17 | Discharge: 2012-07-17 | Disposition: A | Discharge status: Home / Self Care | Payer: Medicare Other | Attending: Emergency Medicine | Admitting: Emergency Medicine

## 2012-07-17 DIAGNOSIS — Z8719 Personal history of other diseases of the digestive system: Secondary | ICD-10-CM | POA: Insufficient documentation

## 2012-07-17 DIAGNOSIS — I10 Essential (primary) hypertension: Secondary | ICD-10-CM | POA: Insufficient documentation

## 2012-07-17 DIAGNOSIS — I509 Heart failure, unspecified: Secondary | ICD-10-CM | POA: Insufficient documentation

## 2012-07-17 DIAGNOSIS — E039 Hypothyroidism, unspecified: Secondary | ICD-10-CM | POA: Insufficient documentation

## 2012-07-17 DIAGNOSIS — I252 Old myocardial infarction: Secondary | ICD-10-CM | POA: Insufficient documentation

## 2012-07-17 DIAGNOSIS — E119 Type 2 diabetes mellitus without complications: Secondary | ICD-10-CM | POA: Insufficient documentation

## 2012-07-17 DIAGNOSIS — J45909 Unspecified asthma, uncomplicated: Secondary | ICD-10-CM | POA: Insufficient documentation

## 2012-07-17 DIAGNOSIS — Z8679 Personal history of other diseases of the circulatory system: Secondary | ICD-10-CM | POA: Insufficient documentation

## 2012-07-17 DIAGNOSIS — IMO0002 Reserved for concepts with insufficient information to code with codable children: Secondary | ICD-10-CM | POA: Insufficient documentation

## 2012-07-17 DIAGNOSIS — I209 Angina pectoris, unspecified: Secondary | ICD-10-CM | POA: Insufficient documentation

## 2012-07-17 DIAGNOSIS — Z7982 Long term (current) use of aspirin: Secondary | ICD-10-CM | POA: Insufficient documentation

## 2012-07-17 DIAGNOSIS — J449 Chronic obstructive pulmonary disease, unspecified: Secondary | ICD-10-CM | POA: Insufficient documentation

## 2012-07-17 DIAGNOSIS — R079 Chest pain, unspecified: Secondary | ICD-10-CM | POA: Insufficient documentation

## 2012-07-17 DIAGNOSIS — Z8739 Personal history of other diseases of the musculoskeletal system and connective tissue: Secondary | ICD-10-CM | POA: Insufficient documentation

## 2012-07-17 DIAGNOSIS — J4489 Other specified chronic obstructive pulmonary disease: Secondary | ICD-10-CM | POA: Insufficient documentation

## 2012-07-17 DIAGNOSIS — Z79899 Other long term (current) drug therapy: Secondary | ICD-10-CM | POA: Insufficient documentation

## 2012-07-17 DIAGNOSIS — G8929 Other chronic pain: Secondary | ICD-10-CM | POA: Insufficient documentation

## 2012-07-17 DIAGNOSIS — F259 Schizoaffective disorder, unspecified: Secondary | ICD-10-CM | POA: Insufficient documentation

## 2012-07-17 DIAGNOSIS — K219 Gastro-esophageal reflux disease without esophagitis: Secondary | ICD-10-CM | POA: Insufficient documentation

## 2012-07-17 DIAGNOSIS — Z862 Personal history of diseases of the blood and blood-forming organs and certain disorders involving the immune mechanism: Secondary | ICD-10-CM | POA: Insufficient documentation

## 2012-07-17 DIAGNOSIS — Z8639 Personal history of other endocrine, nutritional and metabolic disease: Secondary | ICD-10-CM | POA: Insufficient documentation

## 2012-07-17 DIAGNOSIS — Z87440 Personal history of urinary (tract) infections: Secondary | ICD-10-CM | POA: Insufficient documentation

## 2012-07-17 DIAGNOSIS — Z87891 Personal history of nicotine dependence: Secondary | ICD-10-CM | POA: Insufficient documentation

## 2012-07-17 DIAGNOSIS — Z8701 Personal history of pneumonia (recurrent): Secondary | ICD-10-CM | POA: Insufficient documentation

## 2012-07-17 DIAGNOSIS — Z8669 Personal history of other diseases of the nervous system and sense organs: Secondary | ICD-10-CM | POA: Insufficient documentation

## 2012-07-17 LAB — CBC WITH DIFFERENTIAL/PLATELET
Basophils Absolute: 0 10*3/uL (ref 0.0–0.1)
Lymphocytes Relative: 44 % (ref 12–46)
Lymphs Abs: 2.1 10*3/uL (ref 0.7–4.0)
Neutro Abs: 2.1 10*3/uL (ref 1.7–7.7)
Platelets: 135 10*3/uL — ABNORMAL LOW (ref 150–400)
RBC: 4.15 MIL/uL (ref 3.87–5.11)
RDW: 15.1 % (ref 11.5–15.5)
WBC: 4.9 10*3/uL (ref 4.0–10.5)

## 2012-07-17 LAB — BASIC METABOLIC PANEL
CO2: 33 mEq/L — ABNORMAL HIGH (ref 19–32)
Chloride: 97 mEq/L (ref 96–112)
Potassium: 3.9 mEq/L (ref 3.5–5.1)
Sodium: 135 mEq/L (ref 135–145)

## 2012-07-17 LAB — POCT I-STAT TROPONIN I: Troponin i, poc: 0 ng/mL (ref 0.00–0.08)

## 2012-07-17 MED ORDER — TRAMADOL HCL 50 MG PO TABS
50.0000 mg | ORAL_TABLET | Freq: Once | ORAL | Status: AC
  Administered 2012-07-17: 50 mg via ORAL
  Filled 2012-07-17: qty 1
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
## 2012-07-17 NOTE — ED Notes (Signed)
Called and left message with social work
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
## 2012-07-17 NOTE — ED Notes (Signed)
[facility] has been called to pick pt up from ED

## 2012-07-17 NOTE — ED Notes (Signed)
Pt informed that [facility] is coming to pick her up and take her home. Pt given d/c teaching. Pt verbalizes understanding of d/c teaching. Pt educated on at home pain management. Pt has no further questions upon d/c. Pt awaiting [facility] for transport home. Pt does not appear to be in acute distress.

## 2012-07-17 NOTE — ED Provider Notes (Signed)
History     CSN: [id number]  Arrival date & time 07/17/12  1804   First MD Initiated Contact with Patient 07/17/12 1806      Chief Complaint  Patient presents with  . Leg Pain    (Consider location/radiation/quality/duration/timing/severity/associated sxs/prior treatment) HPI Comments: Patient comes to the ER for evaluation of right pain. Patient was complaining of bilateral leg pain for coming to the ER. This is chronic, patient has been seen in this ER multiple times in the past for this complaint. At arrival, patient now complaining of chest pain. She says that this began earlier today. She is not short of breath. She cannot quantify or qualify the pain.  Patient is a 62 y.o. female presenting with leg pain.  Leg Pain     Past Medical History  Diagnosis Date  . Schizoaffective disorder   . Urinary tract infection   . Hypertension   . GERD (gastroesophageal reflux disease)   . Hypothyroidism   . Obesity   . Asthma   . COPD (chronic obstructive pulmonary disease)   . CHF (congestive heart failure)   . Seizures   . Morbid obesity   . Chronic pain   . Hypercholesteremia   . Anginal pain   . Myocardial infarction 2002  . Pneumonia     "several times" (05/23/2012)  . Diabetes mellitus     "I'm not diabetic anymore" (05/23/2012)  . Anemia   . History of blood transfusion 1993    "when I had my hysterectomy" (05/23/2012)  . H/O hiatal hernia   . Daily headache   . Arthritis     "severe; all over my body" (05/23/2012)  . Hypoventilation syndrome     [name] 05/30/2012  . Atrial fibrillation   . Atrial flutter, paroxysmal     [name] 05/30/2012  . Exertional dyspnea   . Shortness of breath     "all the time lately" (05/30/2012)    Past Surgical History  Procedure Date  . Vaginal hysterectomy 1993  . Tubal ligation 1998  . Cholecystectomy ?2008  . Cardiac catheterization 2008    Family History  Problem Relation Age of Onset  . Heart disease Father   . Cancer  Mother 85    Breast    History  Substance Use Topics  . Smoking status: Former Smoker -- 2.0 packs/day for 12 years    Types: Cigarettes    Quit date: 06/29/2000  . Smokeless tobacco: Current User    Types: Snuff, Chew     Comment: 05/30/2012 "still have what you gave me last time"  . Alcohol Use: No    OB History    Grav Para Term Preterm Abortions TAB SAB Ect Mult Living                  Review of Systems  Cardiovascular: Positive for chest pain.  Musculoskeletal:       Bilateral leg pain  All other systems reviewed and are negative.    Allergies  Food; Penicillins; and Sulfamethoxazole w-trimethoprim  Home Medications   Current Outpatient Rx  Name  Route  Sig  Dispense  Refill  . ALBUTEROL SULFATE HFA 108 (90 BASE) MCG/ACT IN AERS   Inhalation   Inhale 1 puff into the lungs daily. For wheezing         . ASPIRIN EC 81 MG PO TBEC   Oral   Take 81 mg by mouth daily.         . BUDESONIDE 0.25  MG/2ML IN SUSP   Nebulization   Take 0.25 mg by nebulization 2 (two) times daily.         . CHLORPROMAZINE HCL 50 MG PO TABS   Oral   Take 50 mg by mouth 2 (two) times daily.         Marland Kitchen DIVALPROEX SODIUM 500 MG PO TBEC   Oral   Take 1,000 mg by mouth 2 (two) times daily. For bipolar mood disorder.         Marland Kitchen FLUTICASONE PROPIONATE 50 MCG/ACT NA SUSP   Nasal   Place 2 sprays into the nose daily as needed. For nasal congestion         . FOLIC ACID 1 MG PO TABS   Oral   Take 1 mg by mouth daily. For nutritional supplement.         . IBUPROFEN 200 MG PO TABS   Oral   Take 200 mg by mouth daily as needed. For pain         . LEVOTHYROXINE SODIUM 150 MCG PO TABS   Oral   Take 150 mcg by mouth daily. For thyroid disease.         Marland Kitchen METOPROLOL SUCCINATE ER 50 MG PO TB24   Oral   Take 50 mg by mouth daily. Take with or immediately following a meal.         . ADULT MULTIVITAMIN W/MINERALS CH   Oral   Take 1 tablet by mouth daily. For nutritional  supplement.         Marland Kitchen NAPROXEN 500 MG PO TABS   Oral   Take 1 tablet (500 mg total) by mouth 2 (two) times daily with a meal.   30 tablet   0   . NITROGLYCERIN 0.4 MG SL SUBL   Sublingual   Place 0.4 mg under the tongue every 5 (five) minutes as needed. For chest pain         . OMEPRAZOLE 20 MG PO CPDR   Oral   Take 20 mg by mouth daily.         Marland Kitchen ONDANSETRON 8 MG PO TBDP   Oral   Take 1 tablet (8 mg total) by mouth every 8 (eight) hours as needed for nausea.   20 tablet   0   . PALIPERIDONE ER 6 MG PO TB24   Oral   Take 6 mg by mouth 2 (two) times daily.         Marland Kitchen POLYETHYLENE GLYCOL 3350 PO PACK   Oral   Take 17 g by mouth every 3 (three) days. constipation         . TRAMADOL HCL 50 MG PO TABS   Oral   Take 50 mg by mouth every 6 (six) hours as needed. For pain           BP 130/87  Pulse 75  Temp 97.8 F (36.6 C) (Oral)  Resp 18  SpO2 93%  Physical Exam  Constitutional: She is oriented to person, place, and time. She appears well-developed and well-nourished. No distress.  HENT:  Head: Normocephalic and atraumatic.  Right Ear: Hearing normal.  Nose: Nose normal.  Mouth/Throat: Oropharynx is clear and moist and mucous membranes are normal.  Eyes: Conjunctivae normal and EOM are normal. Pupils are equal, round, and reactive to light.  Neck: Normal range of motion. Neck supple.  Cardiovascular: Normal rate, regular rhythm, S1 normal and S2 normal.  Exam reveals no gallop and no friction  rub.   No murmur heard. Pulmonary/Chest: Effort normal and breath sounds normal. No respiratory distress. She exhibits no tenderness.  Abdominal: Soft. Normal appearance and bowel sounds are normal. There is no hepatosplenomegaly. There is no tenderness. There is no rebound, no guarding, no tenderness at McBurney's point and negative Murphy's sign. No hernia.  Musculoskeletal: Normal range of motion.  Neurological: She is alert and oriented to person, place, and  time. She has normal strength. No cranial nerve deficit or sensory deficit. Coordination normal. GCS eye subscore is 4. GCS verbal subscore is 5. GCS motor subscore is 6.  Skin: Skin is warm, dry and intact. No rash noted. No cyanosis.  Psychiatric: She has a normal mood and affect. Her speech is normal and behavior is normal. Thought content normal.    ED Course  Procedures (including critical care time)   Date: 07/17/2012  Rate: 71  Rhythm: normal sinus rhythm  QRS Axis: normal  Intervals: normal  ST/T Wave abnormalities: normal  Conduction Disutrbances:none  Narrative Interpretation:   Old EKG Reviewed: unchanged    Labs Reviewed  CBC WITH DIFFERENTIAL - Abnormal; Notable for the following:    MCV 103.1 (*)     Platelets 135 (*)     Neutrophils Relative 42 (*)     Monocytes Relative 13 (*)     All other components within normal limits  BASIC METABOLIC PANEL - Abnormal; Notable for the following:    CO2 33 (*)     GFR calc non Af Amer 75 (*)     GFR calc Af Amer 86 (*)     All other components within normal limits  POCT I-STAT TROPONIN I   No results found.   Diagnosis: Chronic pain    MDM  Patient well-known to the emergency department for recurrent visits. Patient was just seen yesterday for similar complaints. She has been seen multiple times with these complaints in the past. As she does, however, have a history of congestive heart failure and coronary artery disease, EKG and troponin were ordered. There was no change in the EKG and troponin was negative. Patient has chronic complaints of chest pain and leg pain, often comes in with these complaints seeking hospitalization. I do not see anything that would warrant hospitalization at this time.       Gilda Crease, MD 07/17/12 1925

## 2012-07-17 NOTE — ED Notes (Signed)
Per EMS: pt has chronic pain; pts husband states she will not participate in rehab exercises when she goes; BP 120/53; oxygen on room air about 86% and placed on [place] 4L and came up to 96%. 74 pulse; pt has chewing tobacco in mouth that refuses to spit out.

## 2012-07-17 NOTE — ED Notes (Signed)
PTAR at bedside. Report given to PTAR.  

## 2012-07-27 ENCOUNTER — Encounter (HOSPITAL_COMMUNITY): Payer: Self-pay

## 2012-07-27 ENCOUNTER — Emergency Department (HOSPITAL_COMMUNITY)
Admission: EM | Admit: 2012-07-27 | Discharge: 2012-07-27 | Disposition: A | Discharge status: Home / Self Care | Payer: Medicare Other | Source: Home / Self Care | Attending: Emergency Medicine | Admitting: Emergency Medicine

## 2012-07-27 ENCOUNTER — Emergency Department (HOSPITAL_COMMUNITY): Payer: Medicare Other

## 2012-07-27 DIAGNOSIS — Z8709 Personal history of other diseases of the respiratory system: Secondary | ICD-10-CM | POA: Insufficient documentation

## 2012-07-27 DIAGNOSIS — Z7982 Long term (current) use of aspirin: Secondary | ICD-10-CM | POA: Insufficient documentation

## 2012-07-27 DIAGNOSIS — Z79899 Other long term (current) drug therapy: Secondary | ICD-10-CM | POA: Insufficient documentation

## 2012-07-27 DIAGNOSIS — R072 Precordial pain: Secondary | ICD-10-CM | POA: Insufficient documentation

## 2012-07-27 DIAGNOSIS — Z87891 Personal history of nicotine dependence: Secondary | ICD-10-CM | POA: Insufficient documentation

## 2012-07-27 DIAGNOSIS — D649 Anemia, unspecified: Secondary | ICD-10-CM | POA: Insufficient documentation

## 2012-07-27 DIAGNOSIS — K219 Gastro-esophageal reflux disease without esophagitis: Secondary | ICD-10-CM | POA: Insufficient documentation

## 2012-07-27 DIAGNOSIS — Z8669 Personal history of other diseases of the nervous system and sense organs: Secondary | ICD-10-CM | POA: Insufficient documentation

## 2012-07-27 DIAGNOSIS — F259 Schizoaffective disorder, unspecified: Secondary | ICD-10-CM | POA: Insufficient documentation

## 2012-07-27 DIAGNOSIS — G8929 Other chronic pain: Secondary | ICD-10-CM | POA: Insufficient documentation

## 2012-07-27 DIAGNOSIS — E039 Hypothyroidism, unspecified: Secondary | ICD-10-CM | POA: Insufficient documentation

## 2012-07-27 DIAGNOSIS — Z9889 Other specified postprocedural states: Secondary | ICD-10-CM | POA: Insufficient documentation

## 2012-07-27 DIAGNOSIS — G40909 Epilepsy, unspecified, not intractable, without status epilepticus: Secondary | ICD-10-CM | POA: Insufficient documentation

## 2012-07-27 DIAGNOSIS — Z8679 Personal history of other diseases of the circulatory system: Secondary | ICD-10-CM | POA: Insufficient documentation

## 2012-07-27 DIAGNOSIS — I10 Essential (primary) hypertension: Secondary | ICD-10-CM | POA: Insufficient documentation

## 2012-07-27 DIAGNOSIS — M79606 Pain in leg, unspecified: Secondary | ICD-10-CM

## 2012-07-27 DIAGNOSIS — I509 Heart failure, unspecified: Secondary | ICD-10-CM | POA: Insufficient documentation

## 2012-07-27 DIAGNOSIS — E119 Type 2 diabetes mellitus without complications: Secondary | ICD-10-CM | POA: Insufficient documentation

## 2012-07-27 DIAGNOSIS — J449 Chronic obstructive pulmonary disease, unspecified: Secondary | ICD-10-CM | POA: Insufficient documentation

## 2012-07-27 DIAGNOSIS — M79609 Pain in unspecified limb: Secondary | ICD-10-CM | POA: Insufficient documentation

## 2012-07-27 DIAGNOSIS — E7800 Pure hypercholesterolemia, unspecified: Secondary | ICD-10-CM | POA: Insufficient documentation

## 2012-07-27 DIAGNOSIS — Z87440 Personal history of urinary (tract) infections: Secondary | ICD-10-CM | POA: Insufficient documentation

## 2012-07-27 DIAGNOSIS — R0602 Shortness of breath: Secondary | ICD-10-CM | POA: Insufficient documentation

## 2012-07-27 DIAGNOSIS — I252 Old myocardial infarction: Secondary | ICD-10-CM | POA: Insufficient documentation

## 2012-07-27 DIAGNOSIS — I4891 Unspecified atrial fibrillation: Secondary | ICD-10-CM | POA: Insufficient documentation

## 2012-07-27 DIAGNOSIS — J4489 Other specified chronic obstructive pulmonary disease: Secondary | ICD-10-CM | POA: Insufficient documentation

## 2012-07-27 DIAGNOSIS — Z8701 Personal history of pneumonia (recurrent): Secondary | ICD-10-CM | POA: Insufficient documentation

## 2012-07-27 DIAGNOSIS — M129 Arthropathy, unspecified: Secondary | ICD-10-CM | POA: Insufficient documentation

## 2012-07-27 DIAGNOSIS — Z8719 Personal history of other diseases of the digestive system: Secondary | ICD-10-CM | POA: Insufficient documentation

## 2012-07-27 DIAGNOSIS — IMO0002 Reserved for concepts with insufficient information to code with codable children: Secondary | ICD-10-CM | POA: Insufficient documentation

## 2012-07-27 LAB — URINALYSIS, ROUTINE W REFLEX MICROSCOPIC
Glucose, UA: NEGATIVE mg/dL
Ketones, ur: NEGATIVE mg/dL
Nitrite: NEGATIVE
Protein, ur: NEGATIVE mg/dL
Urobilinogen, UA: 1 mg/dL (ref 0.0–1.0)

## 2012-07-27 LAB — URINE MICROSCOPIC-ADD ON

## 2012-07-27 LAB — PRO B NATRIURETIC PEPTIDE: Pro B Natriuretic peptide (BNP): 357.4 pg/mL — ABNORMAL HIGH (ref 0–125)

## 2012-07-27 LAB — TROPONIN I: Troponin I: <0.3 ng/mL (ref <0.30)

## 2012-07-27 MED ORDER — TRAMADOL HCL 50 MG PO TABS: 50.0000 mg | ORAL_TABLET | Freq: Four times a day (QID) | ORAL | Status: DC | PRN

## 2012-07-27 MED ORDER — SODIUM CHLORIDE 0.9 % IV SOLN
Freq: Once | INTRAVENOUS | Status: AC
  Administered 2012-07-27: 10 mL/h via INTRAVENOUS

## 2012-07-27 MED ORDER — MORPHINE SULFATE 4 MG/ML IJ SOLN
4.0000 mg | Freq: Once | INTRAMUSCULAR | Status: AC
  Administered 2012-07-27: 4 mg via INTRAVENOUS
  Filled 2012-07-27: qty 1

## 2012-07-27 NOTE — ED Notes (Signed)
Unsuccessfully attempted to ambulate Pt with NT.  Pt could not sit up on her own or with assistance and refused a 2nd attempt.  PA notified.

## 2012-07-27 NOTE — Progress Notes (Signed)
[facility] ED CM noted CM consult and spoke with pt who confirmed she had [facility] for home health services Cm spoke with ED PA and ED RN.  CM called [facility] and spoke with Joyce Gross one of the weekend RNs In [place] data base pt was d/c from their services for noncompliance on 07/11/12 and a crisis intervention program involvement was documented for 07/27/12.  ED PA updated

## 2012-07-27 NOTE — ED Notes (Signed)
Spoke with Irish Lack, with [facility].  They are the ACT team of [facility].  Trying to get Pt back into [facility].  Sts Pt has declined significantly, since she was discharged home.  Due to decline, she is unable to get to her PCP and refuses to participate with Home Health.  If we need to speak with her, her cell number is [phone] and office [phone].

## 2012-07-27 NOTE — Progress Notes (Signed)
EDP ordered for d/c home with home health services Cm sent [facility] via fax [phone] pending acceptance as a patient The pt made aware that referral completed per choice and that referral pending Encouraged her also to contact pcp for assistance prn  Confirmation for faxed referral received 1932 cm signing off

## 2012-07-27 NOTE — ED Provider Notes (Signed)
History     CSN: [id number]  Arrival date & time 07/27/12  1402   First MD Initiated Contact with Patient 07/27/12 1415      Chief Complaint  Patient presents with  . Leg Pain    (Consider location/radiation/quality/duration/timing/severity/associated sxs/prior treatment) HPI  62 year old female with a past medical history significant for schizoaffective disorder, morbid obesity, COPD, CHF and cardiovascular disease presents the emergency department with chief complaint of bilateral leg pain. Patient is a poor historian. History is limited by the mental capacity of the patient, patient's ability to communicate effectively, and overall poor insight. Patient's speech is difficult to understand. Patient has a  history of chronic leg pain and edema. She is on lasix and states that she has been compliant with all of her medications.  She has been worked up multiple times for leg complaint.  Patient was seen here at the ED 5 days ago for same complaint.  Patient states that within that time she fell off of her bed and has been unable to walk at all.  She is requesting inpatient stay so that she could "get her husband a break."  She also states that she is unable to get herself to the bathroom recently.  During her wait for physician assessment, the patient began complaining of chest pain and shortness of breath.  She states that is substernal and she has associated SOB, but with out n/v/or diaphoresis.   Past Medical History  Diagnosis Date  . Schizoaffective disorder   . Urinary tract infection   . Hypertension   . GERD (gastroesophageal reflux disease)   . Hypothyroidism   . Obesity   . Asthma   . COPD (chronic obstructive pulmonary disease)   . CHF (congestive heart failure)   . Seizures   . Morbid obesity   . Chronic pain   . Hypercholesteremia   . Anginal pain   . Myocardial infarction 2002  . Pneumonia     "several times" (05/23/2012)  . Diabetes mellitus     "I'm not diabetic  anymore" (05/23/2012)  . Anemia   . History of blood transfusion 1993    "when I had my hysterectomy" (05/23/2012)  . H/O hiatal hernia   . Daily headache   . Arthritis     "severe; all over my body" (05/23/2012)  . Hypoventilation syndrome     [name] 05/30/2012  . Atrial fibrillation   . Atrial flutter, paroxysmal     [name] 05/30/2012  . Exertional dyspnea   . Shortness of breath     "all the time lately" (05/30/2012)    Past Surgical History  Procedure Date  . Vaginal hysterectomy 1993  . Tubal ligation 1998  . Cholecystectomy ?2008  . Cardiac catheterization 2008    Family History  Problem Relation Age of Onset  . Heart disease Father   . Cancer Mother 12    Breast    History  Substance Use Topics  . Smoking status: Former Smoker -- 2.0 packs/day for 12 years    Types: Cigarettes    Quit date: 06/29/2000  . Smokeless tobacco: Current User    Types: Snuff, Chew     Comment: 05/30/2012 "still have what you gave me last time"  . Alcohol Use: No    OB History    Grav Para Term Preterm Abortions TAB SAB Ect Mult Living                  Review of Systems Ten systems reviewed  and are negative for acute change, except as noted in the HPI.    Allergies  Food; Penicillins; and Sulfamethoxazole w-trimethoprim  Home Medications   Current Outpatient Rx  Name  Route  Sig  Dispense  Refill  . ALBUTEROL SULFATE HFA 108 (90 BASE) MCG/ACT IN AERS   Inhalation   Inhale 1 puff into the lungs daily. For wheezing         . ASPIRIN EC 81 MG PO TBEC   Oral   Take 81 mg by mouth daily.         . BUDESONIDE 0.25 MG/2ML IN SUSP   Nebulization   Take 0.25 mg by nebulization 2 (two) times daily.         . CHLORPROMAZINE HCL 50 MG PO TABS   Oral   Take 50 mg by mouth 2 (two) times daily.         Marland Kitchen DIVALPROEX SODIUM 500 MG PO TBEC   Oral   Take 1,000 mg by mouth 2 (two) times daily. For bipolar mood disorder.         Marland Kitchen FLUTICASONE PROPIONATE 50 MCG/ACT NA  SUSP   Nasal   Place 2 sprays into the nose daily as needed. For nasal congestion         . FOLIC ACID 1 MG PO TABS   Oral   Take 1 mg by mouth daily. For nutritional supplement.         . IBUPROFEN 200 MG PO TABS   Oral   Take 200 mg by mouth daily as needed. For pain         . LEVOTHYROXINE SODIUM 150 MCG PO TABS   Oral   Take 150 mcg by mouth daily. For thyroid disease.         Marland Kitchen METOPROLOL SUCCINATE ER 50 MG PO TB24   Oral   Take 50 mg by mouth daily. Take with or immediately following a meal.         . ADULT MULTIVITAMIN W/MINERALS CH   Oral   Take 1 tablet by mouth daily. For nutritional supplement.         Marland Kitchen NAPROXEN 500 MG PO TABS   Oral   Take 1 tablet (500 mg total) by mouth 2 (two) times daily with a meal.   30 tablet   0   . NITROGLYCERIN 0.4 MG SL SUBL   Sublingual   Place 0.4 mg under the tongue every 5 (five) minutes as needed. For chest pain         . OMEPRAZOLE 20 MG PO CPDR   Oral   Take 20 mg by mouth daily.         Marland Kitchen ONDANSETRON 8 MG PO TBDP   Oral   Take 1 tablet (8 mg total) by mouth every 8 (eight) hours as needed for nausea.   20 tablet   0   . PALIPERIDONE ER 6 MG PO TB24   Oral   Take 6 mg by mouth 2 (two) times daily.         Marland Kitchen POLYETHYLENE GLYCOL 3350 PO PACK   Oral   Take 17 g by mouth every 3 (three) days. constipation         . TRAMADOL HCL 50 MG PO TABS   Oral   Take 50 mg by mouth every 6 (six) hours as needed. For pain           BP 103/58  Pulse 73  Temp  98.7 F (37.1 C) (Oral)  Resp 20  SpO2 95%  Physical Exam  Nursing note and vitals reviewed. Constitutional: She is oriented to person, place, and time.       Morbidly obese female  HENT:  Head: Normocephalic and atraumatic.  Eyes: Conjunctivae normal are normal. Right eye exhibits no discharge. Left eye exhibits no discharge.  Neck: Normal range of motion. No JVD present. No thyromegaly present.  Cardiovascular: Normal rate.         Distant heart sounds Bilateral edema of the lower extremities.  It is pitting in the feet.  Nonpitting in the legs.  Consistent with chronic lymphedema.  Pulmonary/Chest: Effort normal.       ronchi that clear with cough  Abdominal: Soft. Bowel sounds are normal. She exhibits no distension. There is no tenderness.       Obese asbdomen  Musculoskeletal: Normal range of motion. She exhibits no edema and no tenderness.  Neurological: She is alert and oriented to person, place, and time.    ED Course  Procedures (including critical care time)  Labs Reviewed - No data to display No results found.   Date: 07/27/2012  Rate: 78  Rhythm: normal sinus rhythm  QRS Axis: normal  Intervals: normal  ST/T Wave abnormalities: nonspecific T wave changes  Conduction Disutrbances:none  Narrative Interpretation:   Old EKG Reviewed: unchanged    No diagnosis found.    MDM  5:39 PM BP 103/58  Pulse 73  Temp 98.7 F (37.1 C) (Oral)  Resp 20  SpO2 95% Patient with unchanged EKG.  Troponin is pending. CXR is negative.    6:46 PM Patient with negative work up.  She was seen here by social work who states that she already has [facility] coming in to help her. Nurse spoke with social worker who is aware of her case.   7:07 PM Patient was discharged from [facility] due to non-compliance with home health. She asks: "If I get up and walk can I stay?"  The patient has no criteria for admission at this time.  She still refuses to ambulate.  I have offered the patient pain medicine and a rx for wheelchair which may help her at home.  I have spoken with The Social Worker Selena Batten who has palced orders for consult with [facility].  Face to face performed.  Patient will be transferred home via [facility].  At this time there does not appear to be any evidence of an acute emergency medical condition and the patient appears stable for discharge with appropriate outpatient follow up.Diagnosis  was discussed with patient who verbalizes understanding and is agreeable to discharge. Pt case discussed with Dr. Anitra Lauth who agrees with my plan.     Arthor Captain, PA-C 07/27/12 2027

## 2012-07-27 NOTE — ED Notes (Signed)
Pt is now complaining of chest pain.  

## 2012-07-27 NOTE — ED Notes (Signed)
Per EMS pt c/o chronic pain to both legs, pts husband states pt needs rehab for her ADLs, states she won't walk or help herself.

## 2012-07-28 ENCOUNTER — Inpatient Hospital Stay (HOSPITAL_COMMUNITY)
Admission: EM | Admit: 2012-07-28 | Discharge: 2012-08-01 | DRG: 690 | Disposition: A | Discharge status: Home Health | Payer: Medicare Other | Attending: Internal Medicine | Admitting: Internal Medicine

## 2012-07-28 ENCOUNTER — Encounter (HOSPITAL_COMMUNITY): Payer: Self-pay | Admitting: Emergency Medicine

## 2012-07-28 ENCOUNTER — Emergency Department (HOSPITAL_COMMUNITY): Payer: Medicare Other

## 2012-07-28 DIAGNOSIS — Z6841 Body Mass Index (BMI) 40.0 and over, adult: Secondary | ICD-10-CM

## 2012-07-28 DIAGNOSIS — K219 Gastro-esophageal reflux disease without esophagitis: Secondary | ICD-10-CM | POA: Diagnosis present

## 2012-07-28 DIAGNOSIS — J4489 Other specified chronic obstructive pulmonary disease: Secondary | ICD-10-CM | POA: Diagnosis present

## 2012-07-28 DIAGNOSIS — G8929 Other chronic pain: Secondary | ICD-10-CM

## 2012-07-28 DIAGNOSIS — F250 Schizoaffective disorder, bipolar type: Secondary | ICD-10-CM

## 2012-07-28 DIAGNOSIS — Z79899 Other long term (current) drug therapy: Secondary | ICD-10-CM

## 2012-07-28 DIAGNOSIS — E7800 Pure hypercholesterolemia, unspecified: Secondary | ICD-10-CM | POA: Diagnosis present

## 2012-07-28 DIAGNOSIS — G40909 Epilepsy, unspecified, not intractable, without status epilepticus: Secondary | ICD-10-CM | POA: Diagnosis present

## 2012-07-28 DIAGNOSIS — I509 Heart failure, unspecified: Secondary | ICD-10-CM | POA: Diagnosis present

## 2012-07-28 DIAGNOSIS — J449 Chronic obstructive pulmonary disease, unspecified: Secondary | ICD-10-CM | POA: Diagnosis present

## 2012-07-28 DIAGNOSIS — I10 Essential (primary) hypertension: Secondary | ICD-10-CM | POA: Diagnosis present

## 2012-07-28 DIAGNOSIS — Z880 Allergy status to penicillin: Secondary | ICD-10-CM

## 2012-07-28 DIAGNOSIS — E119 Type 2 diabetes mellitus without complications: Secondary | ICD-10-CM | POA: Diagnosis present

## 2012-07-28 DIAGNOSIS — I4892 Unspecified atrial flutter: Secondary | ICD-10-CM | POA: Diagnosis present

## 2012-07-28 DIAGNOSIS — I252 Old myocardial infarction: Secondary | ICD-10-CM

## 2012-07-28 DIAGNOSIS — E039 Hypothyroidism, unspecified: Secondary | ICD-10-CM | POA: Diagnosis present

## 2012-07-28 DIAGNOSIS — M1710 Unilateral primary osteoarthritis, unspecified knee: Secondary | ICD-10-CM | POA: Diagnosis present

## 2012-07-28 DIAGNOSIS — F259 Schizoaffective disorder, unspecified: Secondary | ICD-10-CM | POA: Diagnosis present

## 2012-07-28 DIAGNOSIS — N390 Urinary tract infection, site not specified: Secondary | ICD-10-CM

## 2012-07-28 DIAGNOSIS — R4182 Altered mental status, unspecified: Secondary | ICD-10-CM

## 2012-07-28 DIAGNOSIS — I4891 Unspecified atrial fibrillation: Secondary | ICD-10-CM | POA: Diagnosis present

## 2012-07-28 LAB — URINALYSIS, ROUTINE W REFLEX MICROSCOPIC
Glucose, UA: NEGATIVE mg/dL
Ketones, ur: 15 mg/dL — AB
Nitrite: POSITIVE — AB
Protein, ur: 100 mg/dL — AB

## 2012-07-28 LAB — POCT I-STAT, CHEM 8
Calcium, Ion: 1.25 mmol/L — ABNORMAL HIGH (ref 1.12–1.23)
Chloride: 97 mEq/L (ref 96–112)
Creatinine, Ser: 1.3 mg/dL — ABNORMAL HIGH (ref 0.50–1.10)
Glucose, Bld: 101 mg/dL — ABNORMAL HIGH (ref 70–99)
HCT: 46 % (ref 36.0–46.0)

## 2012-07-28 LAB — CBC WITH DIFFERENTIAL/PLATELET
HCT: 44.7 % (ref 36.0–46.0)
Hemoglobin: 14 g/dL (ref 12.0–15.0)
Lymphocytes Relative: 27 % (ref 12–46)
Monocytes Absolute: 1.1 10*3/uL — ABNORMAL HIGH (ref 0.1–1.0)
Monocytes Relative: 17 % — ABNORMAL HIGH (ref 3–12)
Neutro Abs: 3.5 10*3/uL (ref 1.7–7.7)
RBC: 4.25 MIL/uL (ref 3.87–5.11)
WBC: 6.4 10*3/uL (ref 4.0–10.5)

## 2012-07-28 LAB — RAPID URINE DRUG SCREEN, HOSP PERFORMED
Amphetamines: NOT DETECTED
Barbiturates: NOT DETECTED
Benzodiazepines: NOT DETECTED
Cocaine: NOT DETECTED

## 2012-07-28 LAB — URINE MICROSCOPIC-ADD ON

## 2012-07-28 NOTE — ED Notes (Signed)
Per EMS, pt is from home. Husband is requesting that she be evaluated for psychiatric concerns and nursing home placement. Pt refuses to care for herself at all. She uses the restroom in the bed and will not get up OOB for any reason.

## 2012-07-28 NOTE — ED Notes (Signed)
Bed:WA19<BR> Expected date:<BR> Expected time:<BR> Means of arrival:<BR> Comments:<BR> EMS

## 2012-07-28 NOTE — ED Provider Notes (Signed)
History     CSN: [id number]  Arrival date & time 07/28/12  2147   First MD Initiated Contact with Patient 07/28/12 2234      Chief Complaint  Patient presents with  . Psychiatric Evaluation    (Consider location/radiation/quality/duration/timing/severity/associated sxs/prior treatment) HPI Comments: Husband reports that patient is unable to care for herself has been urinating in the bed due to increased pain in her legs.  Patient states she does not have the money to fill the last Rx she received for Ultam to relief her pain.  Tonight her speech is very slurred   The history is provided by the patient and the spouse.    Past Medical History  Diagnosis Date  . Schizoaffective disorder   . Urinary tract infection   . Hypertension   . GERD (gastroesophageal reflux disease)   . Hypothyroidism   . Obesity   . Asthma   . COPD (chronic obstructive pulmonary disease)   . CHF (congestive heart failure)   . Seizures   . Morbid obesity   . Chronic pain   . Hypercholesteremia   . Anginal pain   . Myocardial infarction 2002  . Pneumonia     "several times" (05/23/2012)  . Diabetes mellitus     "I'm not diabetic anymore" (05/23/2012)  . Anemia   . History of blood transfusion 1993    "when I had my hysterectomy" (05/23/2012)  . H/O hiatal hernia   . Daily headache   . Arthritis     "severe; all over my body" (05/23/2012)  . Hypoventilation syndrome     [name] 05/30/2012  . Atrial fibrillation   . Atrial flutter, paroxysmal     [name] 05/30/2012  . Exertional dyspnea   . Shortness of breath     "all the time lately" (05/30/2012)    Past Surgical History  Procedure Laterality Date  . Vaginal hysterectomy  1993  . Tubal ligation  1998  . Cholecystectomy  ?2008  . Cardiac catheterization  2008    Family History  Problem Relation Age of Onset  . Heart disease Father   . Cancer Mother 74    Breast    History  Substance Use Topics  . Smoking status: Former Smoker --  2.00 packs/day for 12 years    Types: Cigarettes    Quit date: 06/29/2000  . Smokeless tobacco: Current User    Types: Snuff, Chew     Comment: 05/30/2012 "still have what you gave me last time"  . Alcohol Use: No    OB History   Grav Para Term Preterm Abortions TAB SAB Ect Mult Living                  Review of Systems  Constitutional: Positive for activity change. Negative for fever.  HENT: Negative for neck pain.   Eyes: Negative for visual disturbance.  Respiratory: Negative for shortness of breath.   Cardiovascular: Negative for chest pain and leg swelling.  Musculoskeletal: Positive for myalgias and gait problem.  Skin: Negative for wound.  Neurological: Positive for weakness. Negative for dizziness and headaches.    Allergies  Food; Penicillins; and Sulfamethoxazole w-trimethoprim  Home Medications   No current outpatient prescriptions on file.  BP 135/83  Pulse 77  Temp(Src) 98.1 F (36.7 C) (Oral)  Resp 20  Ht 5\' 3"  (1.6 m)  Wt 324 lb 11.8 oz (147.3 kg)  BMI 57.54 kg/m2  SpO2 95%  Physical Exam  Constitutional: She is oriented to person,  place, and time. She appears well-developed and well-nourished.  obese  HENT:  Head: Normocephalic and atraumatic.  Right Ear: External ear normal.  Left Ear: External ear normal.  Mouth/Throat: Oropharynx is clear and moist.  Eyes: Pupils are equal, round, and reactive to light.  Neck: Normal range of motion.  Cardiovascular: Normal rate and regular rhythm.   Pulmonary/Chest: Effort normal and breath sounds normal. No respiratory distress. She has no wheezes.  Abdominal: Soft. She exhibits no distension. There is no tenderness.  Musculoskeletal: She exhibits edema and tenderness.  Neurological: She is alert and oriented to person, place, and time.  Overall slow to respond but appropriate to all questions and requests speech slurred   Skin: Skin is warm and dry. No erythema. There is pallor.    ED Course   Procedures (including critical care time)  Labs Reviewed  CBC WITH DIFFERENTIAL - Abnormal; Notable for the following:    MCV 105.2 (*)    RDW 15.8 (*)    Platelets 135 (*)    Monocytes Relative 17 (*)    Monocytes Absolute 1.1 (*)    All other components within normal limits  URINE RAPID DRUG SCREEN (HOSP PERFORMED) - Abnormal; Notable for the following:    Opiates POSITIVE (*)    All other components within normal limits  URINALYSIS, ROUTINE W REFLEX MICROSCOPIC - Abnormal; Notable for the following:    Color, Urine ORANGE (*)    APPearance TURBID (*)    Hgb urine dipstick LARGE (*)    Bilirubin Urine LARGE (*)    Ketones, ur 15 (*)    Protein, ur 100 (*)    Nitrite POSITIVE (*)    Leukocytes, UA LARGE (*)    All other components within normal limits  URINE MICROSCOPIC-ADD ON - Abnormal; Notable for the following:    Bacteria, UA MANY (*)    All other components within normal limits  COMPREHENSIVE METABOLIC PANEL - Abnormal; Notable for the following:    CO2 36 (*)    Total Protein 5.9 (*)    Albumin 2.6 (*)    GFR calc non Af Amer 68 (*)    GFR calc Af Amer 78 (*)    All other components within normal limits  CBC - Abnormal; Notable for the following:    MCV 106.4 (*)    RDW 16.1 (*)    Platelets 136 (*)    All other components within normal limits  GLUCOSE, CAPILLARY - Abnormal; Notable for the following:    Glucose-Capillary 103 (*)    All other components within normal limits  GLUCOSE, CAPILLARY - Abnormal; Notable for the following:    Glucose-Capillary 102 (*)    All other components within normal limits  GLUCOSE, CAPILLARY - Abnormal; Notable for the following:    Glucose-Capillary 101 (*)    All other components within normal limits  GLUCOSE, CAPILLARY - Abnormal; Notable for the following:    Glucose-Capillary 114 (*)    All other components within normal limits  GLUCOSE, CAPILLARY - Abnormal; Notable for the following:    Glucose-Capillary 124 (*)     All other components within normal limits  POCT I-STAT, CHEM 8 - Abnormal; Notable for the following:    Creatinine, Ser 1.30 (*)    Glucose, Bld 101 (*)    Calcium, Ion 1.25 (*)    Hemoglobin 15.6 (*)    All other components within normal limits  URINE CULTURE  ETHANOL  LACTIC ACID, PLASMA  HEMOGLOBIN A1C  MAGNESIUM  PHOSPHORUS  TSH  VALPROIC ACID LEVEL  GLUCOSE, CAPILLARY  GLUCOSE, CAPILLARY  GLUCOSE, CAPILLARY  GLUCOSE, CAPILLARY  GLUCOSE, CAPILLARY  GLUCOSE, CAPILLARY  GLUCOSE, CAPILLARY  GLUCOSE, CAPILLARY  GLUCOSE, CAPILLARY  GLUCOSE, CAPILLARY  CBC  BASIC METABOLIC PANEL   No results found.   1. UTI (lower urinary tract infection)   2. Altered mental status   3. Chronic pain   4. Diabetes mellitus   5. Schizoaffective disorder, bipolar type   6. Morbid obesity       MDM   Given given IV Rocephin   she has had this in the past without complication  Will admit with UTI and AMS       Arman Filter, NP 07/30/12 2024  Arman Filter, NP 08/01/12 [phone]

## 2012-07-28 NOTE — ED Notes (Signed)
Pt states that she has bilateral leg pain. No swelling or redness noted but tender to touch. Pt has slurred speech and O2 sats were 84% on arrival. Placed on [place] 4L.

## 2012-07-28 NOTE — ED Provider Notes (Signed)
Medical screening examination/treatment/procedure(s) were performed by non-physician practitioner and as supervising physician I was immediately available for consultation/collaboration.   Burke Terry L Harlem Thresher, MD 07/28/12 1332 

## 2012-07-29 ENCOUNTER — Encounter (HOSPITAL_COMMUNITY): Payer: Self-pay | Admitting: Internal Medicine

## 2012-07-29 DIAGNOSIS — R4182 Altered mental status, unspecified: Secondary | ICD-10-CM | POA: Diagnosis present

## 2012-07-29 LAB — COMPREHENSIVE METABOLIC PANEL
ALT: 12 U/L (ref 0–35)
Alkaline Phosphatase: 46 U/L (ref 39–117)
BUN: 10 mg/dL (ref 6–23)
CO2: 36 mEq/L — ABNORMAL HIGH (ref 19–32)
Chloride: 97 mEq/L (ref 96–112)
GFR calc Af Amer: 78 mL/min — ABNORMAL LOW (ref >90)
Glucose, Bld: 94 mg/dL (ref 70–99)
Potassium: 4.3 mEq/L (ref 3.5–5.1)
Sodium: 136 mEq/L (ref 135–145)
Total Bilirubin: 0.3 mg/dL (ref 0.3–1.2)
Total Protein: 5.9 g/dL — ABNORMAL LOW (ref 6.0–8.3)

## 2012-07-29 LAB — CBC
HCT: 45 % (ref 36.0–46.0)
MCHC: 30.4 g/dL (ref 30.0–36.0)
Platelets: 136 10*3/uL — ABNORMAL LOW (ref 150–400)
RDW: 16.1 % — ABNORMAL HIGH (ref 11.5–15.5)
WBC: 5.5 10*3/uL (ref 4.0–10.5)

## 2012-07-29 LAB — VALPROIC ACID LEVEL: Valproic Acid Lvl: 63.6 ug/mL (ref 50.0–100.0)

## 2012-07-29 LAB — GLUCOSE, CAPILLARY
Glucose-Capillary: 84 mg/dL (ref 70–99)
Glucose-Capillary: 103 mg/dL — ABNORMAL HIGH (ref 70–99)

## 2012-07-29 LAB — LACTIC ACID, PLASMA: Lactic Acid, Venous: 0.7 mmol/L (ref 0.5–2.2)

## 2012-07-29 MED ORDER — DOCUSATE SODIUM 100 MG PO CAPS
100.0000 mg | ORAL_CAPSULE | Freq: Two times a day (BID) | ORAL | Status: DC
  Administered 2012-07-29 – 2012-08-01 (×6): 100 mg via ORAL
  Filled 2012-07-29 (×8): qty 1

## 2012-07-29 MED ORDER — PANTOPRAZOLE SODIUM 40 MG PO TBEC
40.0000 mg | DELAYED_RELEASE_TABLET | Freq: Every day | ORAL | Status: DC
  Administered 2012-07-29 – 2012-08-01 (×4): 40 mg via ORAL
  Filled 2012-07-29 (×3): qty 1

## 2012-07-29 MED ORDER — ONDANSETRON HCL 4 MG PO TABS: 4.0000 mg | ORAL_TABLET | Freq: Four times a day (QID) | ORAL | Status: DC | PRN

## 2012-07-29 MED ORDER — DEXTROSE 5 % IV SOLN
1.0000 g | INTRAVENOUS | Status: DC
  Administered 2012-07-30 – 2012-08-01 (×3): 1 g via INTRAVENOUS
  Filled 2012-07-29 (×3): qty 10

## 2012-07-29 MED ORDER — GUAIFENESIN ER 600 MG PO TB12
600.0000 mg | ORAL_TABLET | Freq: Two times a day (BID) | ORAL | Status: DC
  Administered 2012-07-29 – 2012-08-01 (×6): 600 mg via ORAL
  Filled 2012-07-29 (×8): qty 1

## 2012-07-29 MED ORDER — CHLORPROMAZINE HCL 50 MG PO TABS
50.0000 mg | ORAL_TABLET | Freq: Two times a day (BID) | ORAL | Status: DC
  Administered 2012-07-29 – 2012-08-01 (×6): 50 mg via ORAL
  Filled 2012-07-29 (×9): qty 1

## 2012-07-29 MED ORDER — DIVALPROEX SODIUM 500 MG PO DR TAB
1000.0000 mg | DELAYED_RELEASE_TABLET | Freq: Two times a day (BID) | ORAL | Status: DC
  Administered 2012-07-29 – 2012-07-30 (×3): 1000 mg via ORAL
  Filled 2012-07-29 (×4): qty 2

## 2012-07-29 MED ORDER — HYDROCODONE-ACETAMINOPHEN 5-325 MG PO TABS: 1.0000 | ORAL_TABLET | ORAL | Status: DC | PRN

## 2012-07-29 MED ORDER — SODIUM CHLORIDE 0.9 % IJ SOLN
3.0000 mL | Freq: Two times a day (BID) | INTRAMUSCULAR | Status: DC
  Administered 2012-07-29: 3 mL via INTRAVENOUS

## 2012-07-29 MED ORDER — FLUTICASONE PROPIONATE 50 MCG/ACT NA SUSP
2.0000 | Freq: Every day | NASAL | Status: DC
  Administered 2012-07-29 – 2012-08-01 (×4): 2 via NASAL
  Filled 2012-07-29: qty 16

## 2012-07-29 MED ORDER — ASPIRIN EC 81 MG PO TBEC
81.0000 mg | DELAYED_RELEASE_TABLET | Freq: Every morning | ORAL | Status: DC
  Administered 2012-07-29 – 2012-08-01 (×4): 81 mg via ORAL
  Filled 2012-07-29 (×4): qty 1

## 2012-07-29 MED ORDER — IPRATROPIUM BROMIDE 0.02 % IN SOLN
0.5000 mg | Freq: Four times a day (QID) | RESPIRATORY_TRACT | Status: DC
  Administered 2012-07-29 – 2012-07-31 (×6): 0.5 mg via RESPIRATORY_TRACT
  Filled 2012-07-29 (×6): qty 2.5

## 2012-07-29 MED ORDER — SODIUM CHLORIDE 0.9 % IV SOLN
INTRAVENOUS | Status: AC
  Administered 2012-07-29: 10:00:00 via INTRAVENOUS

## 2012-07-29 MED ORDER — ALBUTEROL SULFATE HFA 108 (90 BASE) MCG/ACT IN AERS
1.0000 | INHALATION_SPRAY | Freq: Four times a day (QID) | RESPIRATORY_TRACT | Status: DC | PRN
  Filled 2012-07-29: qty 6.7

## 2012-07-29 MED ORDER — TRAMADOL HCL 50 MG PO TABS
50.0000 mg | ORAL_TABLET | Freq: Four times a day (QID) | ORAL | Status: DC | PRN
  Administered 2012-07-29 – 2012-08-01 (×3): 100 mg via ORAL
  Filled 2012-07-29 (×4): qty 2

## 2012-07-29 MED ORDER — DEXTROSE 5 % IV SOLN
1.0000 g | Freq: Once | INTRAVENOUS | Status: AC
  Administered 2012-07-29: 1 g via INTRAVENOUS
  Filled 2012-07-29: qty 10

## 2012-07-29 MED ORDER — ACETAMINOPHEN 325 MG PO TABS
650.0000 mg | ORAL_TABLET | Freq: Four times a day (QID) | ORAL | Status: DC | PRN
  Administered 2012-08-01 (×2): 650 mg via ORAL
  Filled 2012-07-29 (×2): qty 2

## 2012-07-29 MED ORDER — BUDESONIDE 0.25 MG/2ML IN SUSP
0.2500 mg | Freq: Two times a day (BID) | RESPIRATORY_TRACT | Status: DC
  Administered 2012-07-29 – 2012-08-01 (×6): 0.25 mg via RESPIRATORY_TRACT
  Filled 2012-07-29 (×8): qty 2

## 2012-07-29 MED ORDER — LEVOTHYROXINE SODIUM 150 MCG PO TABS
150.0000 ug | ORAL_TABLET | Freq: Every day | ORAL | Status: DC
  Administered 2012-07-29 – 2012-08-01 (×4): 150 ug via ORAL
  Filled 2012-07-29 (×5): qty 1

## 2012-07-29 MED ORDER — ENOXAPARIN SODIUM 40 MG/0.4ML ~~LOC~~ SOLN: 40.0000 mg | SUBCUTANEOUS | Status: DC

## 2012-07-29 MED ORDER — PALIPERIDONE ER 6 MG PO TB24
6.0000 mg | ORAL_TABLET | Freq: Two times a day (BID) | ORAL | Status: DC
  Administered 2012-07-29 – 2012-08-01 (×6): 6 mg via ORAL
  Filled 2012-07-29 (×8): qty 1

## 2012-07-29 MED ORDER — ACETAMINOPHEN 650 MG RE SUPP: 650.0000 mg | Freq: Four times a day (QID) | RECTAL | Status: DC | PRN

## 2012-07-29 MED ORDER — METOPROLOL SUCCINATE ER 50 MG PO TB24
50.0000 mg | ORAL_TABLET | Freq: Every morning | ORAL | Status: DC
  Administered 2012-07-30 – 2012-08-01 (×3): 50 mg via ORAL
  Filled 2012-07-29 (×4): qty 1

## 2012-07-29 MED ORDER — ENOXAPARIN SODIUM 80 MG/0.8ML ~~LOC~~ SOLN
70.0000 mg | SUBCUTANEOUS | Status: DC
  Administered 2012-07-29 – 2012-08-01 (×4): 70 mg via SUBCUTANEOUS
  Filled 2012-07-29 (×4): qty 0.8

## 2012-07-29 MED ORDER — INSULIN ASPART 100 UNIT/ML ~~LOC~~ SOLN
0.0000 [IU] | SUBCUTANEOUS | Status: DC
  Administered 2012-07-31: 1 [IU] via SUBCUTANEOUS

## 2012-07-29 MED ORDER — ONDANSETRON HCL 4 MG/2ML IJ SOLN: 4.0000 mg | Freq: Four times a day (QID) | INTRAMUSCULAR | Status: DC | PRN

## 2012-07-29 NOTE — ED Notes (Signed)
4e called x1 for report, RN to call back

## 2012-07-29 NOTE — H&P (Signed)
PCP:  Cardiology  [name], MD    Chief Complaint:   Leg pain  HPI: [name] is a 62 y.o. female   has a past medical history of Schizoaffective disorder; Urinary tract infection; Hypertension; GERD (gastroesophageal reflux disease); Hypothyroidism; Obesity; Asthma; COPD (chronic obstructive pulmonary disease); CHF (congestive heart failure); Seizures; Morbid obesity; Chronic pain; Hypercholesteremia; Anginal pain; Myocardial infarction (2002); Pneumonia; Diabetes mellitus; Anemia; History of blood transfusion (1993); H/O hiatal hernia; Daily headache; Arthritis; Hypoventilation syndrome; Atrial fibrillation; Atrial flutter, paroxysmal; Exertional dyspnea; and Shortness of breath.   Presented with  62 yo F with hx of Chronic pain and frequent visit to ER was [facility] at [facility]/7 for musculoskeletal pain and treated with morphine and discharged to home. She presented back on 2/8 with altered mental status and was found to have UTI.  Patient is somnolent but requesting morphine and something to eat. She states she have not had much of any food or drink recently. She received in ED rocephin as she has tolerated this in the past and hospitalist was called for an admission.   Review of Systems:  Unable to obtain due to confusion  Otherwise ROS are negative except for above, 10 systems were reviewed  Past Medical History: Past Medical History  Diagnosis Date  . Schizoaffective disorder   . Urinary tract infection   . Hypertension   . GERD (gastroesophageal reflux disease)   . Hypothyroidism   . Obesity   . Asthma   . COPD (chronic obstructive pulmonary disease)   . CHF (congestive heart failure)   . Seizures   . Morbid obesity   . Chronic pain   . Hypercholesteremia   . Anginal pain   . Myocardial infarction 2002  . Pneumonia     "several times" (05/23/2012)  . Diabetes mellitus     "I'm not diabetic anymore" (05/23/2012)  . Anemia   . History of blood transfusion 1993   "when I had my hysterectomy" (05/23/2012)  . H/O hiatal hernia   . Daily headache   . Arthritis     "severe; all over my body" (05/23/2012)  . Hypoventilation syndrome     [name] 05/30/2012  . Atrial fibrillation   . Atrial flutter, paroxysmal     [name] 05/30/2012  . Exertional dyspnea   . Shortness of breath     "all the time lately" (05/30/2012)   Past Surgical History  Procedure Laterality Date  . Vaginal hysterectomy  1993  . Tubal ligation  1998  . Cholecystectomy  ?2008  . Cardiac catheterization  2008     Medications: Prior to Admission medications   Medication Sig Start Date End Date Taking? Authorizing Provider  albuterol (PROVENTIL HFA;VENTOLIN HFA) 108 (90 BASE) MCG/ACT inhaler Inhale 1 puff into the lungs every 6 (six) hours as needed (for wheezing). For wheezing   Yes Historical Provider, MD  aspirin EC 81 MG tablet Take 81 mg by mouth every morning.  05/11/12  Yes Neil Mashburn, PA-C  budesonide (PULMICORT) 0.25 MG/2ML nebulizer solution Take 0.25 mg by nebulization 2 (two) times daily.   Yes Historical Provider, MD  chlorproMAZINE (THORAZINE) 50 MG tablet Take 50 mg by mouth 2 (two) times daily.   Yes Historical Provider, MD  divalproex (DEPAKOTE) 500 MG DR tablet Take 1,000 mg by mouth 2 (two) times daily. For bipolar mood disorder.   Yes Verne Spurr, PA-C  fluticasone (FLONASE) 50 MCG/ACT nasal spray Place 2 sprays into the nose daily as needed. For  nasal congestion   Yes Historical Provider, MD  folic acid (FOLVITE) 1 MG tablet Take 1 mg by mouth every morning. For nutritional supplement. 05/11/12  Yes Verne Spurr, PA-C  ibuprofen (ADVIL,MOTRIN) 200 MG tablet Take 400 mg by mouth daily as needed (for pain). For pain   Yes Historical Provider, MD  levothyroxine (SYNTHROID, LEVOTHROID) 150 MCG tablet Take 150 mcg by mouth every morning. For thyroid disease. 05/11/12  Yes Verne Spurr, PA-C  metoprolol succinate (TOPROL-XL) 50 MG 24 hr tablet Take 50 mg by mouth  every morning. Take with or immediately following a meal. 06/03/12  Yes [name], MD  Multiple Vitamin (MULTIVITAMIN WITH MINERALS) TABS Take 1 tablet by mouth every morning. For nutritional supplement. 05/11/12  Yes Verne Spurr, PA-C  naproxen (NAPROSYN) 500 MG tablet Take 1 tablet (500 mg total) by mouth 2 (two) times daily with a meal. 06/29/12  Yes Vida Roller, MD  nitroGLYCERIN (NITROSTAT) 0.4 MG SL tablet Place 0.4 mg under the tongue every 5 (five) minutes as needed. For chest pain   Yes Historical Provider, MD  omeprazole (PRILOSEC) 20 MG capsule Take 20 mg by mouth every morning.  05/11/12  Yes Neil Mashburn, PA-C  ondansetron (ZOFRAN-ODT) 8 MG disintegrating tablet Take 8 mg by mouth every 8 (eight) hours as needed for nausea (for nausea). 07/03/12  Yes Celene Kras, MD  paliperidone ([name]) 6 MG 24 hr tablet Take 6 mg by mouth 2 (two) times daily.   Yes Historical Provider, MD  polyethylene glycol (MIRALAX / GLYCOLAX) packet Take 17 g by mouth every 3 (three) days. constipation   Yes Historical Provider, MD  traMADol (ULTRAM) 50 MG tablet Take 50-100 mg by mouth every 6 (six) hours as needed (for pain). For pain 03/31/12  Yes Renae Fickle, MD    Allergies:   Allergies  Allergen Reactions  . Food Rash    "lasagna"= rash from the noodles  . Penicillins Hives  . Sulfamethoxazole W-Trimethoprim Hives and Nausea And Vomiting    Social History:   Lives at  home   reports that she quit smoking about 12 years ago. Her smoking use included Cigarettes. She has a 24 pack-year smoking history. Her smokeless tobacco use includes Snuff and Chew. She reports that she does not drink alcohol or use illicit drugs.   Family History: family history includes Cancer (age of onset: 63) in her mother and Heart disease in her father.    Physical Exam: Patient Vitals for the past 24 hrs:  BP Temp Temp src Pulse Resp SpO2  07/29/12 0258 - 98.5 F (36.9 C) Oral - - -  07/29/12 0200  114/73 mmHg - - 80 17 94 %  07/28/12 2158 105/64 mmHg 98.8 F (37.1 C) Oral 80 18 93 %    1. General:  in No Acute distress 2. Psychological: somnolent not Oriented to situation 3. Head/ENT:   Moist   Mucous Membranes                          Head Non traumatic, neck supple                          Normal  Dentition 4. SKIN: normal  Skin turgor,  Skin clean Dry and intact no rash 5. Heart: Regular rate and rhythm no Murmur, Rub or gallop 6. Lungs: Clear to auscultation bilaterally, no wheezes or crackles   7.  Abdomen: Soft, non-tender, Non distended, obese 8. Lower extremities: no clubbing, cyanosis, or edema 9. Neurologically Grossly intact, moving all 4 extremities equally 10. MSK: Normal range of motion  body mass index is unknown because there is no weight on file.   Labs on Admission:   Recent Labs  07/28/12 2306  NA 138  K 4.4  CL 97  GLUCOSE 101*  BUN 9  CREATININE 1.30*   No results found for this basename: AST, ALT, ALKPHOS, BILITOT, PROT, ALBUMIN,  in the last 72 hours No results found for this basename: LIPASE, AMYLASE,  in the last 72 hours  Recent Labs  07/28/12 2248 07/28/12 2306  WBC 6.4  --   NEUTROABS 3.5  --   HGB 14.0 15.6*  HCT 44.7 46.0  MCV 105.2*  --   PLT 135*  --     Recent Labs  07/27/12 1717  TROPONINI <0.30   No results found for this basename: TSH, T4TOTAL, FREET3, T3FREE, THYROIDAB,  in the last 72 hours No results found for this basename: VITAMINB12, FOLATE, FERRITIN, TIBC, IRON, RETICCTPCT,  in the last 72 hours Lab Results  Component Value Date   HGBA1C 5.4 01/11/2012    The CrCl is unknown because both a height and weight (above a minimum accepted value) are required for this calculation. ABG    Component Value Date/Time   PHART 7.406 03/30/2012 0446   HCO3 32.9* 03/30/2012 0446   TCO2 35 07/28/2012 2306   O2SAT 99.0 03/30/2012 0446    UA UTI    Cultures:    Component Value Date/Time   SDES URINE, CLEAN CATCH  05/30/2012 1206   SPECREQUEST NONE 05/30/2012 1206   CULT ESCHERICHIA COLI 05/30/2012 1206   REPTSTATUS 06/01/2012 FINAL 05/30/2012 1206       Radiological Exams on Admission: Ct Head Wo Contrast  07/29/2012  *RADIOLOGY REPORT*  Clinical Data: Altered mental status, slurred speech  CT HEAD WITHOUT CONTRAST  Technique:  Contiguous axial images were obtained from the base of the skull through the vertex without contrast.  Comparison: 05/21/2012  Findings: No evidence of parenchymal hemorrhage or extra-axial fluid collection. No mass lesion, mass effect, or midline shift.  No CT evidence of acute infarction.  Cerebral volume is age appropriate.  No ventriculomegaly.  The visualized paranasal sinuses are essentially clear. The mastoid air cells are unopacified.  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  No evidence of calvarial fracture.  IMPRESSION: No evidence of acute intracranial abnormality.   Original Report Authenticated By: Charline Bills, M.D.    Dg Pelvis Portable  07/27/2012  *RADIOLOGY REPORT*  Clinical Data: Fall.  PORTABLE PELVIS AP VIEW 07/27/2012 1704 hours:  Comparison: Visualized pelvis on the acute abdomen series 11/30/2010.  Findings: No acute fractures identified involving the pelvis. Sacroiliac joints intact with degenerative changes.  Symphysis pubis intact.  Mild joint space narrowing involving the left hip, unchanged from prior examination.  Joint space in the right hip well-preserved.  IMPRESSION: No acute osseous abnormality.   Original Report Authenticated By: Hulan Saas, M.D.    Dg Chest Port 1 View  07/27/2012  *RADIOLOGY REPORT*  Clinical Data: Fall.  PORTABLE CHEST - 1 VIEW 07/27/2012 1706 hours:  Comparison: Two-view chest x-ray 06/29/2012.  Portable chest x-ray 05/30/2012.  Findings: Suboptimal inspiration due to body habitus which accounts for crowded bronchovascular markings at the bases and accentuates the cardiac silhouette.  Taking this into  account, cardiac silhouette enlarged but stable.  Lungs clear.  No visible pleural effusions.  IMPRESSION: Markedly suboptimal inspiration.  No acute cardiopulmonary disease. Stable cardiomegaly.   Original Report Authenticated By: Hulan Saas, M.D.     Chart has been reviewed  Assessment/Plan  62 -year-old female with history of CHF, paroxysmal atrial fibrillation, diabetes, chronic pain and schizoaffective disorder here with altered mental status and UTI  Present on Admission:  . UTI (lower urinary tract infection) - ER has initiated Rocephin we'll continue await results of urine culture obtain lactate at this point patient is hemodynamically stable  . Altered mental status - likely due to urinary tract infection CT scan otherwise have been unremarkable we'll monitor avoid narcotics, will check Depakote level  . Schizoaffective disorder, bipolar type - continue home medications  . Diabetes mellitus - sliding scale insulin  . DEGENERATIVE JOINT DISEASE, KNEES, BILATERAL  - this is a source of her chronic pain to avoid narcotics continue the Ultram   Prophylaxis:  Lovenox, Protonix  CODE STATUS: FULL CODE  Other plan as per orders.  I have spent a total of 55 min on this admission  [name] 07/29/2012, 4:30 AM

## 2012-07-29 NOTE — Progress Notes (Signed)
Patient admitted early this AM by Dr. Adela Glimpse.  Patient more awake, asking for coffee and food.    Marlin Canary DO

## 2012-07-29 NOTE — Progress Notes (Signed)
Utilization review completed.  

## 2012-07-29 NOTE — ED Notes (Signed)
4e called x2 for report 

## 2012-07-30 DIAGNOSIS — N390 Urinary tract infection, site not specified: Principal | ICD-10-CM

## 2012-07-30 DIAGNOSIS — G8929 Other chronic pain: Secondary | ICD-10-CM

## 2012-07-30 DIAGNOSIS — R4182 Altered mental status, unspecified: Secondary | ICD-10-CM

## 2012-07-30 DIAGNOSIS — E119 Type 2 diabetes mellitus without complications: Secondary | ICD-10-CM

## 2012-07-30 LAB — GLUCOSE, CAPILLARY
Glucose-Capillary: 75 mg/dL (ref 70–99)
Glucose-Capillary: 95 mg/dL (ref 70–99)
Glucose-Capillary: 96 mg/dL (ref 70–99)
Glucose-Capillary: 97 mg/dL (ref 70–99)

## 2012-07-30 MED ORDER — VALPROATE SODIUM 500 MG/5ML IV SOLN
1000.0000 mg | Freq: Once | INTRAVENOUS | Status: AC
  Administered 2012-07-30: 1000 mg via INTRAVENOUS
  Filled 2012-07-30 (×2): qty 10

## 2012-07-30 MED ORDER — CHLORPROMAZINE HCL 25 MG/ML IJ SOLN
50.0000 mg | Freq: Once | INTRAMUSCULAR | Status: AC
  Administered 2012-07-30: 50 mg via INTRAMUSCULAR
  Filled 2012-07-30: qty 2

## 2012-07-30 MED ORDER — DIVALPROEX SODIUM 500 MG PO DR TAB
1000.0000 mg | DELAYED_RELEASE_TABLET | Freq: Two times a day (BID) | ORAL | Status: DC
  Administered 2012-07-31 – 2012-08-01 (×3): 1000 mg via ORAL
  Filled 2012-07-30 (×4): qty 2

## 2012-07-30 NOTE — Progress Notes (Signed)
Patient in sedated state and cannot safely take oral meds. While patient is arousable, she will only open her eyes briefly and say the word "yes" to the Nurse. Per previous shift report, this has been patients baseline at times. Pupil assessment reveals 1.73mm with sluggish response to light. Pt will not follow other commands for further neuro assessment.  On-call MD notifed of findings, new orders received.  Earnest Conroy. Clelia Croft, RN

## 2012-07-30 NOTE — Care Management Note (Addendum)
    Page 1 of 2   08/01/2012     10:31:23 AM   CARE MANAGEMENT NOTE 08/01/2012  Patient:  [name], [name]   Account Number:  [id number]  Date Initiated:  07/29/2012  Documentation initiated by:  [facility]  Subjective/Objective Assessment:   62 year old female admitted with UTI and AMS.     Action/Plan:   Lived at home PTA.   Anticipated DC Date:  08/01/2012   Anticipated DC Plan:  HOME W HOME HEALTH SERVICES      DC Planning Services  CM consult      Choice offered to / List presented to:  C-1 Patient   DME arranged  BEDSIDE COMMODE      DME agency  [facility]     HH arranged  HH-1 RN  HH-2 PT  HH-3 OT  HH-6 SOCIAL WORKER      HH agency  [facility]   Status of service:  Completed, signed off Medicare Important Message given?  NA - LOS <3 / Initial given by admissions (If response is "NO", the following Medicare IM given date fields will be blank) Date Medicare IM given:   Date Additional Medicare IM given:    Discharge Disposition:  HOME W HOME HEALTH SERVICES  Per UR Regulation:  Reviewed for med. necessity/level of care/duration of stay  If discussed at Long Length of Stay Meetings, dates discussed:    Comments:  08/02/12 Juda Toepfer RN,BSN NCM [phone] PT/OT-SNF.PSYCH-AVERAGE CAPACITY.PATIENT DECLINES SNF,& WANTS HOME [facility].INFORMED OF HH SERVICES-INTERMITTENT,NOT 24HR SERVICE,& HAS AN END DATE VS SNF-STRUCTURED/ONGOING SERVICES.PATIENT VOICED COMPREHENSION.SHE SAYS SHE HAS FAMILY THAT WILL BE @ HOME TO ASSIST.PROVIDED W/PRIVATE DUTY CARE LIST AS RESOURCE FOR 24HR SERVICES.INFORMED THAT THESE SERVICES ARE AN OUT OF POCKET EXPENSE.PATIENT DECLINES RECOMMENDATION OF HOSPITAL BED.  07/30/12 Ulice Follett RN,BSN NCM [phone] PSYCH CONS.

## 2012-07-30 NOTE — Progress Notes (Signed)
[facility] PROGRESS NOTE  [name] WUJ:[id number] DOB: [birth year] DOA: 07/28/2012 PCP: [name], MD  Assessment/Plan: UTI (lower urinary tract infection) - ER has initiated Rocephin we'll continue await results of urine culture obtain lactate at this point patient is hemodynamically stable   . Altered mental status - likely due to urinary tract infection CT scan otherwise have been unremarkable we'll monitor avoid narcotics, will check Depakote level   . Schizoaffective disorder, bipolar type - continue home medications- psych consult- ? Capacity   . Diabetes mellitus - sliding scale insulin   . DEGENERATIVE JOINT DISEASE, KNEES, BILATERAL - this is a source of her chronic pain to avoid narcotics continue the Ultram   Code Status: full Family Communication: called boyfriend Mr. Jena Gauss- no answer Disposition Plan: ? Psych eval- boyfriend told ER he could not handle at home any longer   Consultants:  psych  Procedures:  none  Antibiotics:    HPI/Subjective: Patient wanting to go home No fever, no chills  Objective: Filed Vitals:   07/29/12 2037 07/30/12 0119 07/30/12 0540 07/30/12 0820  BP:   104/62   Pulse:   87   Temp:   97.3 F (36.3 C)   TempSrc:   Axillary   Resp:   19   Height:      Weight:      SpO2: 94% 93% 93% 95%    Intake/Output Summary (Last 24 hours) at 07/30/12 1013 Last data filed at 07/30/12 0759  Gross per 24 hour  Intake    953 ml  Output    700 ml  Net    253 ml   Filed Weights   07/29/12 0923  Weight: 145.5 kg (320 lb 12.3 oz)    Exam:   General:  Answers questions, speech difficult to understand  Cardiovascular: rrr  Respiratory: clear anterior  Abdomen: +BS, soft, NT, obese  Data Reviewed: Basic Metabolic Panel:  Recent Labs Lab 07/28/12 2306 07/29/12 1007 07/30/12 0440  NA 138 136  --   K 4.4 4.3  --   CL 97 97  --   CO2  --  36*  --   GLUCOSE 101* 94  --   BUN 9 10  --   CREATININE  1.30* 0.91  --   CALCIUM  --  9.4  --   MG  --   --  1.7  PHOS  --   --  3.7   Liver Function Tests:  Recent Labs Lab 07/29/12 1007  AST 21  ALT 12  ALKPHOS 46  BILITOT 0.3  PROT 5.9*  ALBUMIN 2.6*   No results found for this basename: LIPASE, AMYLASE,  in the last 168 hours No results found for this basename: AMMONIA,  in the last 168 hours CBC:  Recent Labs Lab 07/28/12 2248 07/28/12 2306 07/29/12 1007  WBC 6.4  --  5.5  NEUTROABS 3.5  --   --   HGB 14.0 15.6* 13.7  HCT 44.7 46.0 45.0  MCV 105.2*  --  106.4*  PLT 135*  --  136*   Cardiac Enzymes:  Recent Labs Lab 07/27/12 1717  TROPONINI <0.30   BNP (last 3 results)  Recent Labs  03/27/12 2102 05/23/12 0452 07/27/12 1717  PROBNP 57.7 53.1 357.4*   CBG:  Recent Labs Lab 07/29/12 1646 07/29/12 2028 07/30/12 0027 07/30/12 0442 07/30/12 0735  GLUCAP 103* 102* 97 96 101*    Recent Results (from the past 240 hour(s))  URINE CULTURE  Status: None   Collection Time    07/28/12 10:47 PM      Result Value Range Status   Specimen Description URINE, CATHETERIZED   Final   Special Requests NONE   Final   Culture  Setup Time 07/29/2012 14:26   Final   Colony Count >=100,000 COLONIES/ML   Final   Culture ESCHERICHIA COLI   Final   Report Status PENDING   Incomplete     Studies: Ct Head Wo Contrast  07/29/2012  *RADIOLOGY REPORT*  Clinical Data: Altered mental status, slurred speech  CT HEAD WITHOUT CONTRAST  Technique:  Contiguous axial images were obtained from the base of the skull through the vertex without contrast.  Comparison: 05/21/2012  Findings: No evidence of parenchymal hemorrhage or extra-axial fluid collection. No mass lesion, mass effect, or midline shift.  No CT evidence of acute infarction.  Cerebral volume is age appropriate.  No ventriculomegaly.  The visualized paranasal sinuses are essentially clear. The mastoid air cells are unopacified.  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  No evidence of calvarial fracture.  IMPRESSION: No evidence of acute intracranial abnormality.   Original Report Authenticated By: Charline Bills, M.D.     Scheduled Meds: . aspirin EC  81 mg Oral q morning - 10a  . budesonide  0.25 mg Nebulization BID  . cefTRIAXone (ROCEPHIN) IVPB 1 gram/50 mL D5W  1 g Intravenous Q24H  . chlorproMAZINE  50 mg Oral BID  . divalproex  1,000 mg Oral BID  . docusate sodium  100 mg Oral BID  . enoxaparin (LOVENOX) injection  70 mg Subcutaneous Q24H  . fluticasone  2 spray Each Nare Daily  . guaiFENesin  600 mg Oral BID  . insulin aspart  0-9 Units Subcutaneous Q4H  . ipratropium  0.5 mg Nebulization Q6H  . levothyroxine  150 mcg Oral QAC breakfast  . metoprolol succinate  50 mg Oral q morning - 10a  . paliperidone  6 mg Oral BID  . pantoprazole  40 mg Oral Daily  . sodium chloride  3 mL Intravenous Q12H   Continuous Infusions:   Active Problems:   DEGENERATIVE JOINT DISEASE, KNEES, BILATERAL   Schizoaffective disorder, bipolar type   Diabetes mellitus   UTI (lower urinary tract infection)   Altered mental status    Time spent: 35    [facility], Braeley Buskey  [facility] Pager [phone]. If 8PM-8AM, please contact night-coverage at [email or web address], password [facility] 07/30/2012, 10:13 AM  LOS: 2 days

## 2012-07-30 NOTE — Clinical Documentation Improvement (Signed)
CHF DOCUMENTATION CLARIFICATION QUERY  THIS DOCUMENT IS NOT A PERMANENT PART OF THE MEDICAL RECORD  TO RESPOND TO THE THIS QUERY, FOLLOW THE INSTRUCTIONS BELOW:  1. If needed, update documentation for the patient's encounter via the notes activity.  2. Access this query again and click edit on the In [employer].  3. After updating, or not, click F2 to complete all highlighted (required) fields concerning your review. Select "additional documentation in the medical record" OR "no additional documentation provided".  4. Click Sign note button.  5. The deficiency will fall out of your In Basket *Please let us know if you are not able to complete this workflow by phone or e-mail (listed below).  Please update your documentation within the medical record to reflect your response to this query.                                                                                    07/30/12  Dear Dollene Cleveland, [facility],  In a better effort to capture your patient's severity of illness, reflect appropriate length of stay and utilization of resources, a review of the patient medical record has revealed the following indicators the diagnosis of Heart Failure.    Based on your clinical judgment, please clarify and document in a progress note and/or discharge summary the clinical condition associated with the following supporting information:  In responding to this query please exercise your independent judgment.  The fact that a query is asked, does not imply that any particular answer is desired or expected.   Clarification Needed    Please clarify CHF acuity and type your are treating with Toprol for this admission.     Possible Clinical Conditions?   Acute Systolic Congestive Heart Failure Acute Diastolic Congestive Heart Failure Acute Systolic & Diastolic Congestive Heart Failure Acute on Chronic Systolic Congestive Heart Failure Acute on Chronic Diastolic Congestive Heart  Failure Acute on Chronic Systolic & Diastolic  Congestive Heart Failure Other Condition________________________________________ Cannot Clinically Determine  Supporting Information:  Risk Factors: Signs & Symptoms:  UTI, CHF,   Diagnostics: Treatment: metoprolol succinate (TOPROL-XL) 24 hr tablet 50 mg Flonase Pulmicort Proventil Mucinex Atrovent   Reviewed:  no additional documentation provided  Thank You,  Enis Slipper  RN, BSN, MSN/Inf, CCDS Clinical Documentation Specialist [facility] Dept Pager: [phone] / E-mail: [email or web address]  Health Information Management [place]

## 2012-07-31 DIAGNOSIS — F259 Schizoaffective disorder, unspecified: Secondary | ICD-10-CM

## 2012-07-31 DIAGNOSIS — F509 Eating disorder, unspecified: Secondary | ICD-10-CM

## 2012-07-31 LAB — GLUCOSE, CAPILLARY
Glucose-Capillary: 91 mg/dL (ref 70–99)
Glucose-Capillary: 91 mg/dL (ref 70–99)

## 2012-07-31 LAB — URINE CULTURE: Colony Count: >=100000

## 2012-07-31 MED ORDER — IPRATROPIUM BROMIDE 0.02 % IN SOLN
0.5000 mg | Freq: Two times a day (BID) | RESPIRATORY_TRACT | Status: DC
  Administered 2012-07-31 – 2012-08-01 (×3): 0.5 mg via RESPIRATORY_TRACT
  Filled 2012-07-31 (×3): qty 2.5

## 2012-07-31 NOTE — Progress Notes (Addendum)
Psych consult received for capacity.  Psych MD notified.  No current psych CSW needs identified.  Psych MD to contact psych CSW if psych CSW services are needed.  Providence Crosby, LCSWA Clinical Social Work [phone]

## 2012-07-31 NOTE — Evaluation (Signed)
Physical Therapy Evaluation Patient Details Name: [name] MRN: [id number] DOB: 09-20-1950 Today's Date: 07/31/2012 Time: 4098-1191 PT Time Calculation (min): 33 min  PT Assessment / Plan / Recommendation Clinical Impression  Pt presents with UTI and history of schizoaffective disorder, COPD, HTN, and CHF.  Tolerated OOB to chair, however requires +2 assist with MAX cues for safety and encouragement to participate.  Pt will benefit from skilled PT in acute venue to address deficits.  PT recommends SNF at D/C to maximize pts safety and function.      PT Assessment  Patient needs continued PT services    Follow Up Recommendations  SNF;Supervision/Assistance - 24 hour    Does the patient have the potential to tolerate intense rehabilitation      Barriers to Discharge Decreased caregiver support      Equipment Recommendations  Other (comment) (TBD)    Recommendations for Other Services     Frequency Min 3X/week    Precautions / Restrictions Precautions Precautions: Fall Precaution Comments: pain in B knees from DJD Restrictions Weight Bearing Restrictions: No   Pertinent Vitals/Pain Pain in B knees during mobility.       Mobility  Bed Mobility Bed Mobility: Supine to Sit;Sitting - Scoot to Edge of Bed Supine to Sit: 1: +2 Total assist;HOB elevated;With rails Supine to Sit: Patient Percentage: 30% Sitting - Scoot to Edge of Bed: 1: +2 Total assist Sitting - Scoot to Edge of Bed: Patient Percentage: 50% Details for Bed Mobility Assistance: Assist for LEs out of bed and for trunk to attain sitting position.  Max cues for pt to reach for rail to self assist.  She finally was able to assist somewhat, however continued to require assist via bed pad to scoot hips.  Transfers Transfers: Sit to Stand;Stand to Sit;Stand Pivot Transfers Sit to Stand: 1: +2 Total assist;With upper extremity assist;From bed Sit to Stand: Patient Percentage: 40% Stand to Sit: 1: +2 Total  assist;With upper extremity assist;With armrests;To bed;To chair/3-in-1 Stand Pivot Transfers: 1: +2 Total assist Stand Pivot Transfers: Patient Percentage: 50% Details for Transfer Assistance: Performed x 3 reps with max cues for hand placement and safety.  During first two attempts at standing, pt tended to sit abruptly without therapists being ready.  Max cues for safety and she also requires max cues for encouragement and to complete task.  She was able to take several small steps from bed to chair, however requires +2 assist with max cues for safety and hand placement.  Ambulation/Gait Ambulation/Gait Assistance: Not tested (comment)    Exercises     PT Diagnosis: Difficulty walking;Generalized weakness;Acute pain  PT Problem List: Decreased strength;Decreased activity tolerance;Decreased balance;Decreased mobility;Decreased coordination;Decreased cognition;Decreased safety awareness;Decreased knowledge of precautions;Cardiopulmonary status limiting activity;Obesity;Pain PT Treatment Interventions: DME instruction;Gait training;Functional mobility training;Therapeutic activities;Therapeutic exercise;Balance training;Patient/family education   PT Goals Acute Rehab PT Goals PT Goal Formulation: With patient Time For Goal Achievement: 08/14/12 Potential to Achieve Goals: Fair Pt will go Supine/Side to Sit: with mod assist PT Goal: Supine/Side to Sit - Progress: Goal set today Pt will go Sit to Supine/Side: with mod assist PT Goal: Sit to Supine/Side - Progress: Goal set today Pt will go Sit to Stand: with mod assist PT Goal: Sit to Stand - Progress: Goal set today Pt will go Stand to Sit: with mod assist PT Goal: Stand to Sit - Progress: Goal set today Pt will Transfer Bed to Chair/Chair to Bed: with max assist PT Transfer Goal: Bed to Chair/Chair to Bed -  Progress: Goal set today Pt will Ambulate: 1 - 15 feet;with +2 total assist;with least restrictive assistive device;Other (comment)  (pt assist 50%) PT Goal: Ambulate - Progress: Goal set today  Visit Information  Last PT Received On: 07/31/12 Assistance Needed: +2    Subjective Data  Subjective: I'm going to be fine at home.  I walk around my apt.  Patient Stated Goal: to return home.    Prior Functioning  Home Living Lives With: Significant other Available Help at Discharge: Family;Available 24 hours/day Type of Home: Apartment Home Access: Elevator Home Layout: One level Bathroom Shower/Tub: Engineer, manufacturing systems: Standard Home Adaptive Equipment: Walker - rolling Prior Function Comments: Difficult to assess PLOF due to pts cognitive status.  Pt states she is up walking at home around her apt with walker, however notes state that "boyfriend" state she does not walk at home.     Cognition  Cognition Overall Cognitive Status: Impaired Area of Impairment: Awareness of deficits;Safety/judgement Arousal/Alertness: Awake/alert Orientation Level: Appears intact for tasks assessed Behavior During Session: [facility] for tasks performed Safety/Judgement: Decreased awareness of safety precautions;Decreased awareness of need for assistance Awareness of Deficits: Pt states that she will be fine at home and walks with no trouble.      Extremity/Trunk Assessment Right Lower Extremity Assessment RLE ROM/Strength/Tone: Deficits RLE ROM/Strength/Tone Deficits: Pt grossly 3/5 per functional assessment, however note that she has DJD in B knees and state that they are painful.  Left Lower Extremity Assessment LLE ROM/Strength/Tone: Deficits LLE ROM/Strength/Tone Deficits: Pt grossly 3/5 per functional assessment, however note that she has DJD in B knees and state that they are painful.   Trunk Assessment Trunk Assessment: Kyphotic   Balance    End of Session PT - End of Session Equipment Utilized During Treatment: Gait belt Activity Tolerance: Patient limited by fatigue Patient left: in chair;with call bell/phone  within reach Nurse Communication: Mobility status;Need for lift equipment  GP     Vista Deck 07/31/2012, 3:22 PM

## 2012-07-31 NOTE — Progress Notes (Signed)
OT Cancellation Note  Patient Details Name: [name] MRN: [id number] DOB: Dec 28, 1950   Cancelled Treatment:    Reason Eval/Treat Not Completed: Other (comment) (psych with pt. Will reattempt tomorrow.  )  Zayvien Canning 07/31/2012, 4:10 PM Marica Otter, OTR/L [phone] 07/31/2012

## 2012-07-31 NOTE — Progress Notes (Signed)
Chart review complete.  Patient is not eligible for [facility] Care Management services because her primary diagnosis of recurrent UTI does not meet [facility] admission criteria.  For any additional questions or new referrals please contact Anibal Henderson BSN RN [facility] Liaison at [phone]

## 2012-07-31 NOTE — Progress Notes (Signed)
[facility] PROGRESS NOTE  [name] [id number]:[id number] DOB: [birth year] DOA: 07/28/2012 PCP: [name], MD  Assessment/Plan: UTI (lower urinary tract infection) - e coli:  Rocephin    . Altered mental status - likely due to urinary tract infection CT scan otherwise have been unremarkable we'll monitor avoid narcotics- seems to be at [place]- will ask psych to see for schizoaffective d/o  . Schizoaffective disorder, bipolar type - continue home medications- psych consult- ? Capacity   . Diabetes mellitus - sliding scale insulin   . DEGENERATIVE JOINT DISEASE, KNEES, BILATERAL - this is a source of her chronic pain to avoid narcotics continue the Ultram   Code Status: full Family Communication: called boyfriend Mr. Jena Gauss- he says patient does not walk at home and requires help of neighbors to get up- wants SNF placement Disposition Plan: SNF   Consultants:  psych  Procedures:  none  Antibiotics:    HPI/Subjective: Patient wanting to go home No fever, no chills  Objective: Filed Vitals:   07/30/12 2051 07/31/12 0121 07/31/12 0459 07/31/12 0500  BP: 116/78  119/75   Pulse: 78  84   Temp: 98.2 F (36.8 C)  98.2 F (36.8 C)   TempSrc: Axillary  Oral   Resp: 18  18   Height:      Weight:    147.3 kg (324 lb 11.8 oz)  SpO2: 97% 92% 91%     Intake/Output Summary (Last 24 hours) at 07/31/12 0919 Last data filed at 07/31/12 0704  Gross per 24 hour  Intake    950 ml  Output    150 ml  Net    800 ml   Filed Weights   07/29/12 0923 07/31/12 0500  Weight: 145.5 kg (320 lb 12.3 oz) 147.3 kg (324 lb 11.8 oz)    Exam:   General:  Pleasant/cooperative- speech somewhat difficult to understand  Cardiovascular: rrr  Respiratory: clear anterior  Abdomen: +BS, soft, NT, obese  Data Reviewed: Basic Metabolic Panel:  Recent Labs Lab 07/28/12 2306 07/29/12 1007 07/30/12 0440  NA 138 136  --   K 4.4 4.3  --   CL 97 97  --   CO2  --  36*  --    GLUCOSE 101* 94  --   BUN 9 10  --   CREATININE 1.30* 0.91  --   CALCIUM  --  9.4  --   MG  --   --  1.7  PHOS  --   --  3.7   Liver Function Tests:  Recent Labs Lab 07/29/12 1007  AST 21  ALT 12  ALKPHOS 46  BILITOT 0.3  PROT 5.9*  ALBUMIN 2.6*   No results found for this basename: LIPASE, AMYLASE,  in the last 168 hours No results found for this basename: AMMONIA,  in the last 168 hours CBC:  Recent Labs Lab 07/28/12 2248 07/28/12 2306 07/29/12 1007  WBC 6.4  --  5.5  NEUTROABS 3.5  --   --   HGB 14.0 15.6* 13.7  HCT 44.7 46.0 45.0  MCV 105.2*  --  106.4*  PLT 135*  --  136*   Cardiac Enzymes:  Recent Labs Lab 07/27/12 1717  TROPONINI <0.30   BNP (last 3 results)  Recent Labs  03/27/12 2102 05/23/12 0452 07/27/12 1717  PROBNP 57.7 53.1 357.4*   CBG:  Recent Labs Lab 07/30/12 1539 07/30/12 2008 07/31/12 0008 07/31/12 0353 07/31/12 0745  GLUCAP 75 95 91 91 87  Recent Results (from the past 240 hour(s))  URINE CULTURE     Status: None   Collection Time    07/28/12 10:47 PM      Result Value Range Status   Specimen Description URINE, CATHETERIZED   Final   Special Requests NONE   Final   Culture  Setup Time 07/29/2012 14:26   Final   Colony Count >=100,000 COLONIES/ML   Final   Culture ESCHERICHIA COLI   Final   Report Status 07/31/2012 FINAL   Final   Organism ID, Bacteria ESCHERICHIA COLI   Final     Studies: No results found.  Scheduled Meds: . aspirin EC  81 mg Oral q morning - 10a  . budesonide  0.25 mg Nebulization BID  . cefTRIAXone (ROCEPHIN) IVPB 1 gram/50 mL D5W  1 g Intravenous Q24H  . chlorproMAZINE  50 mg Oral BID  . divalproex  1,000 mg Oral BID  . docusate sodium  100 mg Oral BID  . enoxaparin (LOVENOX) injection  70 mg Subcutaneous Q24H  . fluticasone  2 spray Each Nare Daily  . guaiFENesin  600 mg Oral BID  . insulin aspart  0-9 Units Subcutaneous Q4H  . ipratropium  0.5 mg Nebulization BID  .  levothyroxine  150 mcg Oral QAC breakfast  . metoprolol succinate  50 mg Oral q morning - 10a  . paliperidone  6 mg Oral BID  . pantoprazole  40 mg Oral Daily  . sodium chloride  3 mL Intravenous Q12H   Continuous Infusions:   Active Problems:   DEGENERATIVE JOINT DISEASE, KNEES, BILATERAL   Schizoaffective disorder, bipolar type   Diabetes mellitus   UTI (lower urinary tract infection)   Altered mental status    Time spent: 25    [facility], Adil Tugwell  [facility] Pager [phone]. If 8PM-8AM, please contact night-coverage at [email or web address], password [facility] 07/31/2012, 9:19 AM  LOS: 3 days

## 2012-07-31 NOTE — Consult Note (Signed)
Patient Identification:  [name] Date of Evaluation:  07/31/2012 Reason for Consult: Schizoaffective Disorder, no care; medications  Referring Provider: Dr. Benjamine Mola  History of Present Illness Pt presented to the ED with musculoskeletal pain, was sent home 07/27/12.  She returned 2.8.14 and was found to have an UTI. She was admitted with medication that had been effective on prior admission.   Past Psychiatric History: Pt receives meds at [facility].   She has no therapy sessions.  She cares for her 51 yo grand daughter,  She says she was diagnosed at age 27 when she broke all the windows in the home.  She spent 3 weeks in a psychiatric ward.  She has been taking Trazodone, Depakote, Risperdal and cogentin.  She has auditory [unrecognized; not command] and visual hallucinations.  She cut her wrist suicide attempt 30 years ago.  She says she does not drink been, no cigarettes, no cocaine, no cannabis.  She was arrested once for false police report.  She has been at [facility] a couple yrs ago.  She denies paraanoid thoughts.    Past Medical History:     Past Medical History  Diagnosis Date  . Schizoaffective disorder   . Urinary tract infection   . Hypertension   . GERD (gastroesophageal reflux disease)   . Hypothyroidism   . Obesity   . Asthma   . COPD (chronic obstructive pulmonary disease)   . CHF (congestive heart failure)   . Seizures   . Morbid obesity   . Chronic pain   . Hypercholesteremia   . Anginal pain   . Myocardial infarction 2002  . Pneumonia     "several times" (05/23/2012)  . Diabetes mellitus     "I'm not diabetic anymore" (05/23/2012)  . Anemia   . History of blood transfusion 1993    "when I had my hysterectomy" (05/23/2012)  . H/O hiatal hernia   . Daily headache   . Arthritis     "severe; all over my body" (05/23/2012)  . Hypoventilation syndrome     [name] 05/30/2012  . Atrial fibrillation   . Atrial flutter, paroxysmal     [name] 05/30/2012  .  Exertional dyspnea   . Shortness of breath     "all the time lately" (05/30/2012)       Past Surgical History  Procedure Laterality Date  . Vaginal hysterectomy  1993  . Tubal ligation  1998  . Cholecystectomy  ?2008  . Cardiac catheterization  2008    Allergies:  Allergies  Allergen Reactions  . Food Rash    "lasagna"= rash from the noodles  . Penicillins Hives  . Sulfamethoxazole W-Trimethoprim Hives and Nausea And Vomiting    Current Medications:  Prior to Admission medications   Medication Sig Start Date End Date Taking? Authorizing Provider  albuterol (PROVENTIL HFA;VENTOLIN HFA) 108 (90 BASE) MCG/ACT inhaler Inhale 1 puff into the lungs every 6 (six) hours as needed (for wheezing). For wheezing   Yes Historical Provider, MD  aspirin EC 81 MG tablet Take 81 mg by mouth every morning.  05/11/12  Yes Neil Mashburn, PA-C  budesonide (PULMICORT) 0.25 MG/2ML nebulizer solution Take 0.25 mg by nebulization 2 (two) times daily.   Yes Historical Provider, MD  chlorproMAZINE (THORAZINE) 50 MG tablet Take 50 mg by mouth 2 (two) times daily.   Yes Historical Provider, MD  divalproex (DEPAKOTE) 500 MG DR tablet Take 1,000 mg by mouth 2 (two) times daily. For bipolar mood disorder.  Yes Verne Spurr, PA-C  fluticasone (FLONASE) 50 MCG/ACT nasal spray Place 2 sprays into the nose daily as needed. For nasal congestion   Yes Historical Provider, MD  folic acid (FOLVITE) 1 MG tablet Take 1 mg by mouth every morning. For nutritional supplement. 05/11/12  Yes Verne Spurr, PA-C  ibuprofen (ADVIL,MOTRIN) 200 MG tablet Take 400 mg by mouth daily as needed (for pain). For pain   Yes Historical Provider, MD  levothyroxine (SYNTHROID, LEVOTHROID) 150 MCG tablet Take 150 mcg by mouth every morning. For thyroid disease. 05/11/12  Yes Verne Spurr, PA-C  metoprolol succinate (TOPROL-XL) 50 MG 24 hr tablet Take 50 mg by mouth every morning. Take with or immediately following a meal. 06/03/12  Yes  [name], MD  Multiple Vitamin (MULTIVITAMIN WITH MINERALS) TABS Take 1 tablet by mouth every morning. For nutritional supplement. 05/11/12  Yes Verne Spurr, PA-C  naproxen (NAPROSYN) 500 MG tablet Take 1 tablet (500 mg total) by mouth 2 (two) times daily with a meal. 06/29/12  Yes Vida Roller, MD  nitroGLYCERIN (NITROSTAT) 0.4 MG SL tablet Place 0.4 mg under the tongue every 5 (five) minutes as needed. For chest pain   Yes Historical Provider, MD  omeprazole (PRILOSEC) 20 MG capsule Take 20 mg by mouth every morning.  05/11/12  Yes Neil Mashburn, PA-C  ondansetron (ZOFRAN-ODT) 8 MG disintegrating tablet Take 8 mg by mouth every 8 (eight) hours as needed for nausea (for nausea). 07/03/12  Yes Celene Kras, MD  paliperidone ([name]) 6 MG 24 hr tablet Take 6 mg by mouth 2 (two) times daily.   Yes Historical Provider, MD  polyethylene glycol (MIRALAX / GLYCOLAX) packet Take 17 g by mouth every 3 (three) days. constipation   Yes Historical Provider, MD  traMADol (ULTRAM) 50 MG tablet Take 50-100 mg by mouth every 6 (six) hours as needed (for pain). For pain 03/31/12  Yes Renae Fickle, MD    Social History:    reports that she quit smoking about 12 years ago. Her smoking use included Cigarettes. She has a 24 pack-year smoking history. Her smokeless tobacco use includes Snuff and Chew. She reports that she does not drink alcohol or use illicit drugs.   Family History:    Family History  Problem Relation Age of Onset  . Heart disease Father   . Cancer Mother 8    Breast    Mental Status Examination/Evaluation: Objective:  Appearance: Morbidly Obese  Eye Contact::  Good  Speech:  Clear and Coherent  Volume:  Normal  Mood:  Expecting at home services  Affect:  Blunt  Thought Process:  Coherent, Goal Directed and Intitled  Orientation:  Full (Time, Place, and Person)  Thought Content:  Hallucinations: Auditory Visual  Suicidal Thoughts:  No  Homicidal Thoughts:  No  Judgement:   Fair  Insight:  Lacking   DIAGNOSIS:   AXIS I  Hx of Schizoaffective Disorder, Eating Disorder,   AXIS II  Deferred  AXIS III See medical notes.  AXIS IV economic problems, other psychosocial or environmental problems, problems related to social environment, problems with access to health care services and Pt is so obese, it is difficult for her to mobilize. she wants services delivered to her house   AXIS V 41-50 serious symptoms   Assessment/Plan:  Discussed Dr. Benjamine Mola [phone] Pt is in chair with nasal cannula.  She has a soft voice, even rate of speech.  She immediately explains she needs 24/7 home health care.  She says she is able to do her own ADLs.  Her daugther, grand daughter and Her husband, Micah Noel lives there.    She says she has no therapy and needs services brought to the house.   She denies suicide and denies paranoid thoughts.  She agrees to mental status examination.     The date is July 31, 2012.  She spells 'world' and makes to tries to spell world backwards correctly.  She is unable to accurately subtract serial 3s. With two errors; 20-3=17, 14 1/1, 1/0.  She solves a problem logically.   Her answers suggest some problem in concentration .  Her general answers indicate average performance.  Her enormous size, the complexity of her medical problems suggest at in home assessment may be revealing of several concerns about her ability to care for self; organize care of others, her eating habits which complicate her medical conditions and her motivation to work on optimizing health.  RECOMMENDATION:  1.  Pt demonstrates average capacity but question her insight to management of medical problems; ability to manage and also care for a 60 yo. 2.  Discuss need for CPS evaluation with staff concerned.  3.  Pt does know her medications, a review suggests outpatient psychiatrist needs to review and streamline choices according to her tolerance and efficacy.  Of note she is taking Depakote  and Risperdal both of which contribute significantly to weight gain. Titration and adjustment would take too long in hospital stay.  4.  Refer pt requests to Psych CSW to determine what is possible.  5.  No further psychiatric needs identified unless requested.  Will discuss with Hospitalist in am Wed.  Mickeal Skinner MD 07/31/2012 2:46 PM

## 2012-08-01 ENCOUNTER — Emergency Department (HOSPITAL_COMMUNITY)
Admission: EM | Admit: 2012-08-01 | Discharge: 2012-08-02 | Disposition: A | Discharge status: Home / Self Care | Payer: Medicare Other | Attending: Emergency Medicine | Admitting: Emergency Medicine

## 2012-08-01 ENCOUNTER — Encounter (HOSPITAL_COMMUNITY): Payer: Self-pay | Admitting: Vascular Surgery

## 2012-08-01 ENCOUNTER — Emergency Department (HOSPITAL_COMMUNITY): Payer: Medicare Other

## 2012-08-01 DIAGNOSIS — I509 Heart failure, unspecified: Secondary | ICD-10-CM | POA: Insufficient documentation

## 2012-08-01 DIAGNOSIS — Z8739 Personal history of other diseases of the musculoskeletal system and connective tissue: Secondary | ICD-10-CM | POA: Insufficient documentation

## 2012-08-01 DIAGNOSIS — Z7982 Long term (current) use of aspirin: Secondary | ICD-10-CM | POA: Insufficient documentation

## 2012-08-01 DIAGNOSIS — I252 Old myocardial infarction: Secondary | ICD-10-CM | POA: Insufficient documentation

## 2012-08-01 DIAGNOSIS — F259 Schizoaffective disorder, unspecified: Secondary | ICD-10-CM | POA: Insufficient documentation

## 2012-08-01 DIAGNOSIS — E039 Hypothyroidism, unspecified: Secondary | ICD-10-CM | POA: Insufficient documentation

## 2012-08-01 DIAGNOSIS — Z79899 Other long term (current) drug therapy: Secondary | ICD-10-CM | POA: Insufficient documentation

## 2012-08-01 DIAGNOSIS — Z8719 Personal history of other diseases of the digestive system: Secondary | ICD-10-CM | POA: Insufficient documentation

## 2012-08-01 DIAGNOSIS — G8929 Other chronic pain: Secondary | ICD-10-CM | POA: Insufficient documentation

## 2012-08-01 DIAGNOSIS — Z87440 Personal history of urinary (tract) infections: Secondary | ICD-10-CM | POA: Insufficient documentation

## 2012-08-01 DIAGNOSIS — Z862 Personal history of diseases of the blood and blood-forming organs and certain disorders involving the immune mechanism: Secondary | ICD-10-CM | POA: Insufficient documentation

## 2012-08-01 DIAGNOSIS — R5381 Other malaise: Secondary | ICD-10-CM | POA: Insufficient documentation

## 2012-08-01 DIAGNOSIS — Z87891 Personal history of nicotine dependence: Secondary | ICD-10-CM | POA: Insufficient documentation

## 2012-08-01 DIAGNOSIS — I10 Essential (primary) hypertension: Secondary | ICD-10-CM | POA: Insufficient documentation

## 2012-08-01 DIAGNOSIS — K219 Gastro-esophageal reflux disease without esophagitis: Secondary | ICD-10-CM | POA: Insufficient documentation

## 2012-08-01 DIAGNOSIS — W19XXXA Unspecified fall, initial encounter: Secondary | ICD-10-CM

## 2012-08-01 DIAGNOSIS — E119 Type 2 diabetes mellitus without complications: Secondary | ICD-10-CM | POA: Insufficient documentation

## 2012-08-01 DIAGNOSIS — J449 Chronic obstructive pulmonary disease, unspecified: Secondary | ICD-10-CM | POA: Insufficient documentation

## 2012-08-01 DIAGNOSIS — Z8679 Personal history of other diseases of the circulatory system: Secondary | ICD-10-CM | POA: Insufficient documentation

## 2012-08-01 DIAGNOSIS — J4489 Other specified chronic obstructive pulmonary disease: Secondary | ICD-10-CM | POA: Insufficient documentation

## 2012-08-01 DIAGNOSIS — Z8701 Personal history of pneumonia (recurrent): Secondary | ICD-10-CM | POA: Insufficient documentation

## 2012-08-01 LAB — BASIC METABOLIC PANEL
BUN: 13 mg/dL (ref 6–23)
Calcium: 9.3 mg/dL (ref 8.4–10.5)
Creatinine, Ser: 0.67 mg/dL (ref 0.50–1.10)
GFR calc non Af Amer: >90 mL/min (ref >90)
Glucose, Bld: 94 mg/dL (ref 70–99)
Potassium: 4 mEq/L (ref 3.5–5.1)

## 2012-08-01 LAB — CBC
HCT: 42.3 % (ref 36.0–46.0)
HCT: 41.1 % (ref 36.0–46.0)
Hemoglobin: 13.3 g/dL (ref 12.0–15.0)
MCH: 32.2 pg (ref 26.0–34.0)
MCHC: 31.4 g/dL (ref 30.0–36.0)
MCV: 102.4 fL — ABNORMAL HIGH (ref 78.0–100.0)
RBC: 4.17 MIL/uL (ref 3.87–5.11)
RDW: 15.2 % (ref 11.5–15.5)
WBC: 4.5 10*3/uL (ref 4.0–10.5)

## 2012-08-01 LAB — GLUCOSE, CAPILLARY
Glucose-Capillary: 103 mg/dL — ABNORMAL HIGH (ref 70–99)
Glucose-Capillary: 81 mg/dL (ref 70–99)

## 2012-08-01 MED ORDER — LEVOFLOXACIN 750 MG PO TABS: 750.0000 mg | ORAL_TABLET | Freq: Every day | ORAL | Status: DC

## 2012-08-01 NOTE — Discharge Summary (Addendum)
Physician Discharge Summary  [name]:[id number] DOB: [birth year] DOA: 07/28/2012  PCP: [name], MD  Admit date: 07/28/2012 Discharge date: 08/01/2012  Time spent: 40  minutes  Recommendations for Outpatient Follow-up:  1. Home with HHPT, OT, RN, SW, bedside commode and HH aide  Discharge Diagnoses:  Principal Problem:   UTI (lower urinary tract infection)  Active Problems:   Altered mental status   Schizoaffective disorder, bipolar type   Diabetes mellitus   Afib  morbid obesity   Discharge Condition: Fair  Diet recommendation: Diabetic  Filed Weights   07/29/12 0923 07/31/12 0500 08/01/12 0525  Weight: 145.5 kg (320 lb 12.3 oz) 147.3 kg (324 lb 11.8 oz) 149 kg (328 lb 7.8 oz)    History of present illness:  62 yo F with hx of Chronic pain and frequent visit to ER was seen at [facility] 2/7 for musculoskeletal pain and treated with morphine and discharged to home. She presented back on 2/8 with altered mental status and was found to have UTI. Has multiple comorbidities including Schizoaffective disorder; recurrent Urinary tract infection; Hypertension; GERD (gastroesophageal reflux disease); Hypothyroidism; Obesity; Asthma; COPD (chronic obstructive pulmonary disease); CHF (congestive heart failure); Seizures; Morbid obesity; Chronic pain; Hypercholesteremia; Anginal pain; Myocardial infarction (2002); Pneumonia; Diabetes mellitus; Anemia; History of blood transfusion (1993); H/O hiatal hernia; Daily headache; Arthritis; Hypoventilation syndrome; Atrial fibrillation; Atrial flutter, paroxysmal; Exertional dyspnea; and Shortness of breath.     Hospital Course:     . Altered mental status  - likely due to urinary tract infection.  The scan unremarkable and narcotics avoided. She does ask for a lot of narcotics. Her mental status has now resolved. Urine culture is growing Escherichia coli which is sensitive to Rocephin and quinolones. She is being treated with  quinolones while in the hospital and will be discharged on oral levofloxacin to complete a total 7 days course.  . Schizoaffective disorder, bipolar type  Medications continued. Psych consult was obtained for capacity and although there seemed to be lack of insight she did have capacity. Patient also lives with a 60 year old at home and psychiatry consult recommended involving child protective services to evaluate home situation. I have placed in for outpatient social work consult .   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 Avoid NSAIDs and continue only tramadol. Avoid narcotics  Morbid obesity Counseled on diet evidence and exercise as tolerated reduce weight  Remaining to medical issues are stable. She is recommended to follow up with her PCP in one week.   And seen by PT and recommended for skilled nursing facility however she refused and wished to go home. She will be discharged with services.  Procedures:  And  Consultations:  Dr. Ferol Luz (psychiatry)  Discharge Exam: Filed Vitals:   07/31/12 2105 08/01/12 0525 08/01/12 0800 08/01/12 0923  BP: 135/83 130/75 135/88   Pulse: 77 75 78   Temp: 98.1 F (36.7 C) 98.2 F (36.8 C) 98 F (36.7 C)   TempSrc: Oral Oral Oral   Resp: 20 18 17    Height:      Weight:  149 kg (328 lb 7.8 oz)    SpO2: 95% 96% 93% 93%    General: Middle aged morbidly obese female in no acute distress HEENT: No pallor, moist oral mucosa Cardiovascular: Normal S1 and S2, no murmurs rub or gallop Respiratory: Equal air entry bilaterally, no added sounds Abdomen: Soft,  nontender, nondistended, bowel sounds present Extremities: Warm, no edema CNS: AAO x3  Discharge Instructions     Medication List    STOP taking these medications       naproxen 500 MG tablet  Commonly known as:  NAPROSYN      TAKE these medications       albuterol 108  (90 BASE) MCG/ACT inhaler  Commonly known as:  PROVENTIL HFA;VENTOLIN HFA  Inhale 1 puff into the lungs every 6 (six) hours as needed (for wheezing). For wheezing     aspirin EC 81 MG tablet  Take 81 mg by mouth every morning.     budesonide 0.25 MG/2ML nebulizer solution  Commonly known as:  PULMICORT  Take 0.25 mg by nebulization 2 (two) times daily.     chlorproMAZINE 50 MG tablet  Commonly known as:  THORAZINE  Take 50 mg by mouth 2 (two) times daily.     divalproex 500 MG DR tablet  Commonly known as:  DEPAKOTE  Take 1,000 mg by mouth 2 (two) times daily. For bipolar mood disorder.     fluticasone 50 MCG/ACT nasal spray  Commonly known as:  FLONASE  Place 2 sprays into the nose daily as needed. For nasal congestion     folic acid 1 MG tablet  Commonly known as:  FOLVITE  Take 1 mg by mouth every morning. For nutritional supplement.     ibuprofen 200 MG tablet  Commonly known as:  ADVIL,MOTRIN  Take 400 mg by mouth daily as needed (for pain). For pain     levofloxacin 750 MG tablet  Commonly known as:  LEVAQUIN  Take 1 tablet (750 mg total) by mouth daily. ( until 08/04/2012)     levothyroxine 150 MCG tablet  Commonly known as:  SYNTHROID, LEVOTHROID  Take 150 mcg by mouth every morning. For thyroid disease.     metoprolol succinate 50 MG 24 hr tablet  Commonly known as:  TOPROL-XL  Take 50 mg by mouth every morning. Take with or immediately following a meal.     multivitamin with minerals Tabs  Take 1 tablet by mouth every morning. For nutritional supplement.     nitroGLYCERIN 0.4 MG SL tablet  Commonly known as:  NITROSTAT  Place 0.4 mg under the tongue every 5 (five) minutes as needed. For chest pain     omeprazole 20 MG capsule  Commonly known as:  PRILOSEC  Take 20 mg by mouth every morning.     ondansetron 8 MG disintegrating tablet  Commonly known as:  ZOFRAN-ODT  Take 8 mg by mouth every 8 (eight) hours as needed for nausea (for nausea).      paliperidone 6 MG 24 hr tablet  Commonly known as:  [name]  Take 6 mg by mouth 2 (two) times daily.     polyethylene glycol packet  Commonly known as:  MIRALAX / GLYCOLAX  Take 17 g by mouth every 3 (three) days. constipation     traMADol 50 MG tablet  Commonly known as:  ULTRAM  Take 50-100 mg by mouth every 6 (six) hours as needed (for pain). For pain           Follow-up Information   Follow up with [name], MD In 1 week.   Contact information:   [address] [place] [address] [phone]        The results of significant diagnostics from this hospitalization (including imaging, microbiology, ancillary and laboratory) are listed below  for reference.    Significant Diagnostic Studies: Ct Head Wo Contrast  07/29/2012  *RADIOLOGY REPORT*  Clinical Data: Altered mental status, slurred speech  CT HEAD WITHOUT CONTRAST  Technique:  Contiguous axial images were obtained from the base of the skull through the vertex without contrast.  Comparison: 05/21/2012  Findings: No evidence of parenchymal hemorrhage or extra-axial fluid collection. No mass lesion, mass effect, or midline shift.  No CT evidence of acute infarction.  Cerebral volume is age appropriate.  No ventriculomegaly.  The visualized paranasal sinuses are essentially clear. The mastoid air cells are unopacified.  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  No evidence of calvarial fracture.  IMPRESSION: No evidence of acute intracranial abnormality.   Original Report Authenticated By: Charline Bills, M.D.    Dg Pelvis Portable  07/27/2012  *RADIOLOGY REPORT*  Clinical Data: Fall.  PORTABLE PELVIS AP VIEW 07/27/2012 1704 hours:  Comparison: Visualized pelvis on the acute abdomen series 11/30/2010.  Findings: No acute fractures identified involving the pelvis. Sacroiliac joints intact with degenerative changes.  Symphysis pubis intact.  Mild joint space narrowing involving the left hip,  unchanged from prior examination.  Joint space in the right hip well-preserved.  IMPRESSION: No acute osseous abnormality.   Original Report Authenticated By: Hulan Saas, M.D.    Dg Chest Port 1 View  07/27/2012  *RADIOLOGY REPORT*  Clinical Data: Fall.  PORTABLE CHEST - 1 VIEW 07/27/2012 1706 hours:  Comparison: Two-view chest x-ray 06/29/2012.  Portable chest x-ray 05/30/2012.  Findings: Suboptimal inspiration due to body habitus which accounts for crowded bronchovascular markings at the bases and accentuates the cardiac silhouette.  Taking this into account, cardiac silhouette enlarged but stable.  Lungs clear.  No visible pleural effusions.  IMPRESSION: Markedly suboptimal inspiration.  No acute cardiopulmonary disease. Stable cardiomegaly.   Original Report Authenticated By: Hulan Saas, M.D.     Microbiology: Recent Results (from the past 240 hour(s))  URINE CULTURE     Status: None   Collection Time    07/28/12 10:47 PM      Result Value Range Status   Specimen Description URINE, CATHETERIZED   Final   Special Requests NONE   Final   Culture  Setup Time 07/29/2012 14:26   Final   Colony Count >=100,000 COLONIES/ML   Final   Culture ESCHERICHIA COLI   Final   Report Status 07/31/2012 FINAL   Final   Organism ID, Bacteria ESCHERICHIA COLI   Final     Labs: Basic Metabolic Panel:  Recent Labs Lab 07/28/12 2306 07/29/12 1007 07/30/12 0440 08/01/12 0450  NA 138 136  --  131*  K 4.4 4.3  --  4.0  CL 97 97  --  93*  CO2  --  36*  --  34*  GLUCOSE 101* 94  --  94  BUN 9 10  --  13  CREATININE 1.30* 0.91  --  0.67  CALCIUM  --  9.4  --  9.3  MG  --   --  1.7  --   PHOS  --   --  3.7  --    Liver Function Tests:  Recent Labs Lab 07/29/12 1007  AST 21  ALT 12  ALKPHOS 46  BILITOT 0.3  PROT 5.9*  ALBUMIN 2.6*   No results found for this basename: LIPASE, AMYLASE,  in the last 168 hours No results found for this basename: AMMONIA,  in the last 168  hours CBC:  Recent Labs  Lab 07/28/12 2248 07/28/12 2306 07/29/12 1007 08/01/12 0450  WBC 6.4  --  5.5 4.5  NEUTROABS 3.5  --   --   --   HGB 14.0 15.6* 13.7 13.3  HCT 44.7 46.0 45.0 42.3  MCV 105.2*  --  106.4* 102.4*  PLT 135*  --  136* 107*   Cardiac Enzymes:  Recent Labs Lab 07/27/12 1717  TROPONINI <0.30   BNP: BNP (last 3 results)  Recent Labs  03/27/12 2102 05/23/12 0452 07/27/12 1717  PROBNP 57.7 53.1 357.4*   CBG:  Recent Labs Lab 07/31/12 1703 07/31/12 2103 08/01/12 0021 08/01/12 0523 08/01/12 0742  GLUCAP 124* 103* 96 89 85       Signed:  Nadiya Pieratt  [facility] 08/01/2012, 11:08 AM

## 2012-08-01 NOTE — Evaluation (Signed)
Occupational Therapy Evaluation Patient Details Name: [name] MRN: [id number] DOB: Jul 16, 1950 Today's Date: 08/01/2012 Time: 4098-1191 OT Time Calculation (min): 26 min  OT Assessment / Plan / Recommendation Clinical Impression  This 62 year old female was admitted with UTI .  She has a h/o schizoaffective disorder and had some assistance at home.  Pt needs A x 2 for mobility and LB adls and will benefit from continued OT.  Goals in acute remain +2 assistance for safety but with pt's increased participation to decrease  burden of care.      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  Not rated.  Repositioned    ADL  Eating/Feeding: Simulated;Independent Where Assessed - Eating/Feeding: Chair Grooming: Performed;Wash/dry hands;Wash/dry face;Brushing hair (set up washing; min A hair--lots of tangles) Where Assessed - Grooming: Supported sitting Upper Body Bathing: Simulated;Minimal assistance Where Assessed - Upper Body Bathing: Supported sitting Lower Body Bathing: Simulated;+2 Total assistance Lower Body Bathing: Patient Percentage: 20% Where Assessed - Lower Body Bathing: Supported sit to stand Upper Body Dressing: Simulated;Moderate assistance Where Assessed - Upper Body Dressing: Supported sitting Lower Body Dressing: Simulated;+2 Total assistance Lower Body Dressing: Patient Percentage: 0% Where Assessed - Lower Body Dressing: Supported sit to stand Toilet Transfer: Simulated;+2 Total assistance Toilet Transfer Method:  Surveyor, minerals:  (bed to recliner) Toileting - Architect and Hygiene: Simulated;+2 Total assistance Toileting - Architect and Hygiene: Patient Percentage: 0% Where Assessed - Toileting Clothing Manipulation and Hygiene: Sit to stand from 3-in-1 or toilet Transfers/Ambulation Related to ADLs: spt only:  pt initially flexed trunk but able to stand upright.  Poor safety:  pt sat prematurely even with cues.      OT Diagnosis: Generalized weakness  OT Problem List: Decreased strength;Decreased activity tolerance;Impaired balance (sitting and/or standing);Decreased safety awareness;Decreased knowledge of use of DME or AE;Cardiopulmonary status limiting activity;Pain OT Treatment Interventions: Self-care/ADL training;DME and/or AE instruction;Therapeutic activities;Patient/family education;Balance training   OT Goals Acute Rehab OT Goals OT Goal Formulation: With patient Time For Goal Achievement: 08/15/12 Potential to Achieve Goals: Good ADL Goals Pt Will Perform Lower Body Bathing: with 2+ total assist;Sit to stand from chair (pt 50% with AE) ADL Goal: Lower Body Bathing - Progress: Goal set today Pt Will Perform Lower Body Dressing: with 2+ total assist;with adaptive equipment (pt 40%) ADL Goal: Lower Body Dressing - Progress: Progressing toward goals Pt Will Transfer to Toilet: with 2+ total assist;Stand pivot transfer;Extra wide 3-in-1 (pt 60%) ADL Goal: Toilet Transfer - Progress: Goal set today Miscellaneous OT Goals Miscellaneous OT Goal #1: Pt will maintain unsupported standing x 2 minutes with A x 2, pt 80% for hygiene OT Goal: Miscellaneous Goal #1 - Progress: Goal set today Miscellaneous OT Goal #2: Pt will follow cues for safety 75% of time OT Goal: Miscellaneous Goal #2 - Progress: Progressing toward goals  Visit Information  Last OT Received On: 08/01/12 Assistance Needed: +2    Subjective Data  Subjective: My daughter helps  me Patient Stated Goal: home today  Prior Functioning     Home Living Lives With: Significant other Available Help at Discharge: Family;Available 24 hours/day Type of Home: Apartment Home Access: Elevator Home Layout: One level Bathroom Shower/Tub: Engineer, manufacturing systems: Standard Home Adaptive Equipment: Walker - rolling Prior Function Level of Independence: Needs assistance (pt vague:  says long story) Communication Communication:  (pt mumbles; difficult to understand) Dominant Hand: Right         Vision/Perception     Cognition  Cognition Overall Cognitive Status: Impaired Area of Impairment: Following commands;Safety/judgement;Awareness of deficits Arousal/Alertness: Awake/alert Orientation Level: Appears intact for tasks assessed Behavior During Session:  (intermittent bouts of agitation, Mostly WFL) Following Commands: Follows one step commands inconsistently Safety/Judgement: Decreased awareness of safety precautions;Decreased safety judgement for tasks assessed;Impulsive;Decreased awareness of need for assistance    Extremity/Trunk Assessment Right Upper Extremity Assessment RUE ROM/Strength/Tone: Within functional levels Left Upper Extremity Assessment LUE ROM/Strength/Tone: Deficits LUE ROM/Strength/Tone Deficits: able to lift to 90 FF.  Has tremor at times.  Strength good within her range.     Mobility Bed Mobility Bed Mobility: Supine to Sit Supine to Sit: 1: +2 Total assist;HOB elevated;With rails Supine to Sit: Patient Percentage: 40% Sitting - Scoot to Edge of Bed: 1: +2 Total assist Sitting - Scoot to Edge of Bed: Patient Percentage: 50% Details for Bed Mobility Assistance: Continues to requires assist for LEs out of bed; pt did use handrail with cues for assisting trunk into sitting.  Continues to requires some assist at trunk to get fully upright.  Transfers Sit to Stand: 1: +2 Total assist;With upper extremity assist;From bed Sit to  Stand: Patient Percentage: 40% Stand to Sit: 1: +2 Total assist;With upper extremity assist;With armrests;To bed;To chair/3-in-1 Details for Transfer Assistance: max cues for hand placement and for safety     Exercise     Balance     End of Session OT - End of Session Activity Tolerance: Patient tolerated treatment well Patient left: in chair;with call bell/phone within reach (maximove pad place) Nurse Communication: Need for lift equipment  GO     Laylani Pudwill 08/01/2012, 9:36 AM Marica Otter, OTR/L [phone] 08/01/2012

## 2012-08-01 NOTE — Progress Notes (Signed)
Pt showed bigeminy beats on monitor, pt is asymptomatic. Pt has hx of NSR with occasional trigeminy. Paged MD to notify. - Christell Faith, RN

## 2012-08-01 NOTE — ED Notes (Addendum)
Pt reports to the ED for eval of fall x 2 times in one hour. Pt could not get up without EMS assistance. Pt reports that she slid off the edge of the bed x 2 and the second time she couldn't get back in the bed. Pt was recently d/c for kidney infection and pneumonia. Pt reports thaher t boyfriend hasn't gotten her abx filled for her. Pt alert and oriented x 3.

## 2012-08-01 NOTE — ED Notes (Signed)
Pt brought to ED via [facility] for evaluation of increased weakness - called EMS X3 today for evaluation regarding a fall, denies any injuries.  Pt has no complaints at this time.

## 2012-08-01 NOTE — Progress Notes (Signed)
Physical Therapy Treatment Patient Details Name: [name] MRN: [id number] DOB: [birth year] Today's Date: 08/01/2012 Time: 4098-1191 PT Time Calculation (min): 25 min  PT Assessment / Plan / Recommendation Comments on Treatment Session  Pt did somewhat better with bed mobility today, however not as well with transfer and demos unsafe behavior, stating she needed therapist to move so she could do it.  Performed all mobility with 2L O2 due to SaO2 being 88% on RA upon arrival (RT checked). Continue to recommend SNF for follow up at D/C.     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  Continues to requires some assist at trunk to get fully upright.  Transfers Transfers: Sit to Stand;Stand to Sit;Stand Pivot Transfers Sit to Stand: 1: +2 Total assist;With upper extremity assist;From bed Sit to Stand: Patient Percentage: 40% Stand to Sit: 1: +2 Total  assist;With upper extremity assist;With armrests;To bed;To chair/3-in-1 Stand to Sit: Patient Percentage: 40% Stand Pivot Transfers: 1: +2 Total assist Stand Pivot Transfers: Patient Percentage: 40% Details for Transfer Assistance: Assist to rise and steady throughout with max cues for hand placement and safety.  Pt did not perform transfers as well today (esp stand pivot) and demos agitation during transfer wanting to do it herself.  Utilized RW today for increased UE support, however pt continues to sit impulsively without being all the way at chair.  Ambulation/Gait Ambulation/Gait Assistance: Not tested (comment)    Exercises     PT Diagnosis:    PT Problem List:   PT Treatment Interventions:     PT Goals Acute Rehab PT Goals PT Goal Formulation: With patient Time For Goal Achievement: 08/14/12 Potential to Achieve Goals: Fair Pt will go Supine/Side to Sit: with mod assist PT Goal: Supine/Side to Sit - Progress: Progressing toward goal Pt will go Sit to Stand: with mod assist PT Goal: Sit to Stand - Progress: Progressing toward goal Pt will go Stand to Sit: with mod assist PT Goal: Stand to Sit - Progress: Progressing toward goal Pt will Transfer Bed to Chair/Chair to Bed: with max assist PT Transfer Goal: Bed to Chair/Chair to Bed - Progress: Progressing toward goal  Visit Information  Last PT Received On: 08/01/12 Assistance Needed: +2    Subjective Data  Subjective: I have plenty of help at home.  Am I going home today? Patient Stated Goal: to return home.    Cognition  Cognition Overall Cognitive Status: Impaired Area of Impairment: Following commands;Safety/judgement;Awareness  of deficits Arousal/Alertness: Awake/alert Orientation Level: Appears intact for tasks assessed Behavior During Session:  (intermittent bouts of agitation, Mostly WFL) Following Commands: Follows one step commands inconsistently Safety/Judgement: Decreased awareness of safety  precautions;Decreased safety judgement for tasks assessed;Impulsive;Decreased awareness of need for assistance    Balance     End of Session PT - End of Session Equipment Utilized During Treatment: Gait belt;Oxygen Activity Tolerance: Patient limited by fatigue Patient left: in chair;with call bell/phone within reach Nurse Communication: Mobility status;Need for lift equipment   GP     Vista Deck 08/01/2012, 9:39 AM

## 2012-08-01 NOTE — ED Provider Notes (Addendum)
History     CSN: [id number]  Arrival date & time 08/01/12  2243   First MD Initiated Contact with Patient 08/01/12 2249      Chief Complaint  Patient presents with  . Weakness    (Consider location/radiation/quality/duration/timing/severity/associated sxs/prior treatment) Patient is a 62 y.o. female presenting with weakness. The history is provided by the patient.  Weakness This is a recurrent problem. The current episode started more than 1 week ago. The problem occurs daily. The problem has not changed since onset.Nothing aggravates the symptoms. Nothing relieves the symptoms. She has tried nothing for the symptoms.    Past Medical History  Diagnosis Date  . Schizoaffective disorder   . Urinary tract infection   . Hypertension   . GERD (gastroesophageal reflux disease)   . Hypothyroidism   . Obesity   . Asthma   . COPD (chronic obstructive pulmonary disease)   . CHF (congestive heart failure)   . Seizures   . Morbid obesity   . Chronic pain   . Hypercholesteremia   . Anginal pain   . Myocardial infarction 2002  . Pneumonia     "several times" (05/23/2012)  . Diabetes mellitus     "I'm not diabetic anymore" (05/23/2012)  . Anemia   . History of blood transfusion 1993    "when I had my hysterectomy" (05/23/2012)  . H/O hiatal hernia   . Daily headache   . Arthritis     "severe; all over my body" (05/23/2012)  . Hypoventilation syndrome     [name] 05/30/2012  . Atrial fibrillation   . Atrial flutter, paroxysmal     [name] 05/30/2012  . Exertional dyspnea   . Shortness of breath     "all the time lately" (05/30/2012)    Past Surgical History  Procedure Laterality Date  . Vaginal hysterectomy  1993  . Tubal ligation  1998  . Cholecystectomy  ?2008  . Cardiac catheterization  2008    Family History  Problem Relation Age of Onset  . Heart disease Father   . Cancer Mother 53    Breast    History  Substance Use Topics  . Smoking status: Former Smoker --  2.00 packs/day for 12 years    Types: Cigarettes    Quit date: 06/29/2000  . Smokeless tobacco: Current User    Types: Snuff, Chew     Comment: 05/30/2012 "still have what you gave me last time"  . Alcohol Use: No    OB History   Grav Para Term Preterm Abortions TAB SAB Ect Mult Living                  Review of Systems  Neurological: Positive for weakness.  All other systems reviewed and are negative.    Allergies  Food; Penicillins; and Sulfamethoxazole w-trimethoprim  Home Medications   Current Outpatient Rx  Name  Route  Sig  Dispense  Refill  . albuterol (PROVENTIL HFA;VENTOLIN HFA) 108 (90 BASE) MCG/ACT inhaler   Inhalation   Inhale 1 puff into the lungs every 6 (six) hours as needed (for wheezing). For wheezing         . aspirin EC 81 MG tablet   Oral   Take 81 mg by mouth every morning.          . budesonide (PULMICORT) 0.25 MG/2ML nebulizer solution   Nebulization   Take 0.25 mg by nebulization 2 (two) times daily.         . chlorproMAZINE (THORAZINE)  50 MG tablet   Oral   Take 50 mg by mouth 2 (two) times daily.         . divalproex (DEPAKOTE) 500 MG DR tablet   Oral   Take 1,000 mg by mouth 2 (two) times daily. For bipolar mood disorder.         . fluticasone (FLONASE) 50 MCG/ACT nasal spray   Nasal   Place 2 sprays into the nose daily as needed. For nasal congestion         . folic acid (FOLVITE) 1 MG tablet   Oral   Take 1 mg by mouth every morning. For nutritional supplement.         Marland Kitchen ibuprofen (ADVIL,MOTRIN) 200 MG tablet   Oral   Take 400 mg by mouth daily as needed (for pain). For pain         . levofloxacin (LEVAQUIN) 750 MG tablet   Oral   Take 1 tablet (750 mg total) by mouth daily.   3 tablet   0   . levothyroxine (SYNTHROID, LEVOTHROID) 150 MCG tablet   Oral   Take 150 mcg by mouth every morning. For thyroid disease.         . metoprolol succinate (TOPROL-XL) 50 MG 24 hr tablet   Oral   Take 50 mg by  mouth every morning. Take with or immediately following a meal.         . Multiple Vitamin (MULTIVITAMIN WITH MINERALS) TABS   Oral   Take 1 tablet by mouth every morning. For nutritional supplement.         . nitroGLYCERIN (NITROSTAT) 0.4 MG SL tablet   Sublingual   Place 0.4 mg under the tongue every 5 (five) minutes as needed. For chest pain         . omeprazole (PRILOSEC) 20 MG capsule   Oral   Take 20 mg by mouth every morning.          . ondansetron (ZOFRAN-ODT) 8 MG disintegrating tablet   Oral   Take 8 mg by mouth every 8 (eight) hours as needed for nausea (for nausea).         . paliperidone ([name]) 6 MG 24 hr tablet   Oral   Take 6 mg by mouth 2 (two) times daily.         . polyethylene glycol (MIRALAX / GLYCOLAX) packet   Oral   Take 17 g by mouth every 3 (three) days. constipation         . traMADol (ULTRAM) 50 MG tablet   Oral   Take 50-100 mg by mouth every 6 (six) hours as needed (for pain). For pain           BP 114/68  Pulse 76  Temp(Src) 98.2 F (36.8 C) (Oral)  Resp 18  SpO2 92%  Physical Exam  Constitutional: She is oriented to person, place, and time. She appears well-developed and well-nourished.  HENT:  Head: Normocephalic and atraumatic.  Eyes: Conjunctivae and EOM are normal. Pupils are equal, round, and reactive to light.  Neck: Normal range of motion.  Cardiovascular: Normal rate, regular rhythm and normal heart sounds.   Pulmonary/Chest: Effort normal and breath sounds normal.  Abdominal: Soft. Bowel sounds are normal.  Musculoskeletal: Normal range of motion.  Neurological: She is alert and oriented to person, place, and time.  Skin: Skin is warm and dry.  Psychiatric: She has a normal mood and affect. Her behavior is normal.  ED Course  Procedures (including critical care time)  Labs Reviewed  CBC  URINALYSIS, ROUTINE W REFLEX MICROSCOPIC   No results found.   No diagnosis found.   Date: 08/01/2012   Rate: 73  Rhythm: normal sinus rhythm  QRS Axis: normal  Intervals: normal  ST/T Wave abnormalities: nonspecific ST changes  Conduction Disutrbances:none  Narrative Interpretation:   Old EKG Reviewed: unchanged   MDM  P t dx with uti,  Chronic recurrent fall.  Recently dc from [place].  Pt states unable to get her prescriptions filled.  Complains now of all over body pain.  No loc, no cp, no sob,  Chronic leg pain,  Unchanged.   uti resolved.  Improved.  Will dc to oupt fu.        Jacque Byron Lytle Michaels, MD 08/01/12 2317  Rosanne Ashing, MD 08/01/12 1610  Rosanne Ashing, MD 08/02/12 [id number]

## 2012-08-01 NOTE — Progress Notes (Signed)
Asked by psych MD to refer Pt to a community ACT team.  Met with Pt to discuss psych MD's recommendation a community ACT team.  Pt reported that she is already followed by an ACT team and that she is seen weekly.  Her visits with her psychiatrist, Dr. Lelon Perla, occur regularly or when she requests to see him.  Pt reported feeling happy with this service, although she reported that she wished they would call before coming.  She stated that she's been asking them for years to call first, however they just "show up."  No further psych CSW needs identified.  Psych CSW to sign off.  Providence Crosby, LCSWA Clinical Social Work [phone]

## 2012-08-02 LAB — URINALYSIS, ROUTINE W REFLEX MICROSCOPIC
Hgb urine dipstick: NEGATIVE
Nitrite: NEGATIVE
Specific Gravity, Urine: 1.013 (ref 1.005–1.030)
Urobilinogen, UA: 1 mg/dL (ref 0.0–1.0)

## 2012-08-02 NOTE — ED Notes (Signed)
[facility] called for transport.  

## 2012-08-02 NOTE — ED Notes (Signed)
Pt placed on 3.5L o2 [place] due to 02 stats dropping to 83% RA while sleeping

## 2012-08-04 NOTE — ED Provider Notes (Signed)
Medical screening examination/treatment/procedure(s) were performed by non-physician practitioner and as supervising physician I was immediately available for consultation/collaboration.  John-Adam Carianna Lague, M.D.     John-Adam Jaleen Finch, MD 08/04/12 0737 

## 2012-08-06 ENCOUNTER — Emergency Department (HOSPITAL_COMMUNITY): Payer: Medicare Other

## 2012-08-06 ENCOUNTER — Inpatient Hospital Stay (HOSPITAL_COMMUNITY)
Admission: EM | Admit: 2012-08-06 | Discharge: 2012-08-14 | DRG: 189 | Disposition: A | Discharge status: Skilled Nursing Facility | Payer: Medicare Other | Attending: Cardiology | Admitting: Cardiology

## 2012-08-06 ENCOUNTER — Encounter (HOSPITAL_COMMUNITY): Payer: Self-pay | Admitting: Radiology

## 2012-08-06 DIAGNOSIS — F259 Schizoaffective disorder, unspecified: Secondary | ICD-10-CM | POA: Diagnosis present

## 2012-08-06 DIAGNOSIS — G4733 Obstructive sleep apnea (adult) (pediatric): Secondary | ICD-10-CM

## 2012-08-06 DIAGNOSIS — I5032 Chronic diastolic (congestive) heart failure: Secondary | ICD-10-CM | POA: Diagnosis present

## 2012-08-06 DIAGNOSIS — G8929 Other chronic pain: Secondary | ICD-10-CM | POA: Diagnosis present

## 2012-08-06 DIAGNOSIS — E119 Type 2 diabetes mellitus without complications: Secondary | ICD-10-CM | POA: Diagnosis present

## 2012-08-06 DIAGNOSIS — E662 Morbid (severe) obesity with alveolar hypoventilation: Secondary | ICD-10-CM | POA: Diagnosis present

## 2012-08-06 DIAGNOSIS — Z79899 Other long term (current) drug therapy: Secondary | ICD-10-CM

## 2012-08-06 DIAGNOSIS — E669 Obesity, unspecified: Secondary | ICD-10-CM

## 2012-08-06 DIAGNOSIS — I9589 Other hypotension: Secondary | ICD-10-CM | POA: Diagnosis present

## 2012-08-06 DIAGNOSIS — Z6841 Body Mass Index (BMI) 40.0 and over, adult: Secondary | ICD-10-CM

## 2012-08-06 DIAGNOSIS — I252 Old myocardial infarction: Secondary | ICD-10-CM

## 2012-08-06 DIAGNOSIS — I10 Essential (primary) hypertension: Secondary | ICD-10-CM | POA: Diagnosis present

## 2012-08-06 DIAGNOSIS — J9620 Acute and chronic respiratory failure, unspecified whether with hypoxia or hypercapnia: Principal | ICD-10-CM | POA: Diagnosis present

## 2012-08-06 DIAGNOSIS — I509 Heart failure, unspecified: Secondary | ICD-10-CM | POA: Diagnosis present

## 2012-08-06 DIAGNOSIS — G40909 Epilepsy, unspecified, not intractable, without status epilepticus: Secondary | ICD-10-CM | POA: Diagnosis present

## 2012-08-06 DIAGNOSIS — R0902 Hypoxemia: Secondary | ICD-10-CM

## 2012-08-06 DIAGNOSIS — J9601 Acute respiratory failure with hypoxia: Secondary | ICD-10-CM

## 2012-08-06 DIAGNOSIS — R4182 Altered mental status, unspecified: Secondary | ICD-10-CM

## 2012-08-06 DIAGNOSIS — E039 Hypothyroidism, unspecified: Secondary | ICD-10-CM | POA: Diagnosis present

## 2012-08-06 DIAGNOSIS — J441 Chronic obstructive pulmonary disease with (acute) exacerbation: Secondary | ICD-10-CM | POA: Diagnosis present

## 2012-08-06 DIAGNOSIS — R7989 Other specified abnormal findings of blood chemistry: Secondary | ICD-10-CM

## 2012-08-06 DIAGNOSIS — Z87891 Personal history of nicotine dependence: Secondary | ICD-10-CM

## 2012-08-06 LAB — COMPREHENSIVE METABOLIC PANEL
AST: 42 U/L — ABNORMAL HIGH (ref 0–37)
Albumin: 3 g/dL — ABNORMAL LOW (ref 3.5–5.2)
CO2: 31 mEq/L (ref 19–32)
Calcium: 9.9 mg/dL (ref 8.4–10.5)
Creatinine, Ser: 0.67 mg/dL (ref 0.50–1.10)
GFR calc non Af Amer: >90 mL/min (ref >90)

## 2012-08-06 LAB — URINALYSIS, ROUTINE W REFLEX MICROSCOPIC
Glucose, UA: NEGATIVE mg/dL
Nitrite: NEGATIVE
Protein, ur: NEGATIVE mg/dL

## 2012-08-06 LAB — CBC
MCH: 32.5 pg (ref 26.0–34.0)
MCV: 101.8 fL — ABNORMAL HIGH (ref 78.0–100.0)
Platelets: 128 10*3/uL — ABNORMAL LOW (ref 150–400)
RDW: 15.3 % (ref 11.5–15.5)

## 2012-08-06 LAB — POCT I-STAT 3, ART BLOOD GAS (G3+)
Acid-Base Excess: 1 mmol/L (ref 0.0–2.0)
pCO2 arterial: 77.2 mmHg (ref 35.0–45.0)
pO2, Arterial: 75 mmHg — ABNORMAL LOW (ref 80.0–100.0)

## 2012-08-06 LAB — POCT I-STAT TROPONIN I
Troponin i, poc: 0 ng/mL (ref 0.00–0.08)
Troponin i, poc: 0 ng/mL (ref 0.00–0.08)

## 2012-08-06 LAB — URINE MICROSCOPIC-ADD ON

## 2012-08-06 LAB — PRO B NATRIURETIC PEPTIDE: Pro B Natriuretic peptide (BNP): 80.5 pg/mL (ref 0–125)

## 2012-08-06 LAB — D-DIMER, QUANTITATIVE: D-Dimer, Quant: 1.1 ug/mL-FEU — ABNORMAL HIGH (ref 0.00–0.48)

## 2012-08-06 MED ORDER — ONDANSETRON HCL 4 MG/2ML IJ SOLN
4.0000 mg | Freq: Once | INTRAMUSCULAR | Status: AC
  Administered 2012-08-06: 4 mg via INTRAVENOUS
  Filled 2012-08-06: qty 2

## 2012-08-06 MED ORDER — LEVOFLOXACIN IN D5W 750 MG/150ML IV SOLN
750.0000 mg | INTRAVENOUS | Status: DC
  Administered 2012-08-06 – 2012-08-08 (×2): 750 mg via INTRAVENOUS
  Filled 2012-08-06 (×4): qty 150

## 2012-08-06 MED ORDER — SODIUM CHLORIDE 0.9 % IV BOLUS (SEPSIS)
500.0000 mL | INTRAVENOUS | Status: AC
  Administered 2012-08-06: 500 mL via INTRAVENOUS

## 2012-08-06 MED ORDER — ALBUTEROL SULFATE HFA 108 (90 BASE) MCG/ACT IN AERS
6.0000 | INHALATION_SPRAY | Freq: Once | RESPIRATORY_TRACT | Status: AC
  Administered 2012-08-06: 6 via RESPIRATORY_TRACT
  Filled 2012-08-06: qty 6.7

## 2012-08-06 MED ORDER — HYDROMORPHONE HCL PF 1 MG/ML IJ SOLN
1.0000 mg | Freq: Once | INTRAMUSCULAR | Status: AC
  Administered 2012-08-06: 1 mg via INTRAVENOUS
  Filled 2012-08-06: qty 1

## 2012-08-06 MED ORDER — IOHEXOL 350 MG/ML SOLN
100.0000 mL | Freq: Once | INTRAVENOUS | Status: AC | PRN
  Administered 2012-08-06: 100 mL via INTRAVENOUS

## 2012-08-06 MED ORDER — MORPHINE SULFATE 4 MG/ML IJ SOLN
4.0000 mg | Freq: Once | INTRAMUSCULAR | Status: AC
  Administered 2012-08-06: 4 mg via INTRAVENOUS
  Filled 2012-08-06: qty 1

## 2012-08-06 MED ORDER — ENOXAPARIN SODIUM 80 MG/0.8ML ~~LOC~~ SOLN: 80.0000 mg | SUBCUTANEOUS | Status: DC

## 2012-08-06 MED ORDER — NALOXONE HCL 0.4 MG/ML IJ SOLN
0.4000 mg | Freq: Once | INTRAMUSCULAR | Status: AC
  Administered 2012-08-06: 0.4 mg via INTRAVENOUS
  Filled 2012-08-06: qty 1

## 2012-08-06 MED ORDER — SODIUM CHLORIDE 0.9 % IV BOLUS (SEPSIS)
500.0000 mL | Freq: Once | INTRAVENOUS | Status: AC
  Administered 2012-08-06: 500 mL via INTRAVENOUS

## 2012-08-06 NOTE — ED Notes (Signed)
Patient transported to CT 

## 2012-08-06 NOTE — Progress Notes (Signed)
Notified Gwyneth Sprout, MD of critical ABG values. RBV pH 7.22, PaCO2 77 at 2208.

## 2012-08-06 NOTE — ED Provider Notes (Signed)
Date: 08/06/2012  Rate: 101  Rhythm: sinus tachycardia  QRS Axis: normal  Intervals: normal  ST/T Wave abnormalities: nonspecific T wave changes  Conduction Disutrbances:none  Narrative Interpretation:   Old EKG Reviewed: T wave abnormalites more pronounced on prior EKG    6:30 PM Care transferred at 5pm from Arthor Captain, PA-C at 5pm on [phone] w/ morbid obesity and multiple medical problems who presents w/ "feeling sick" for several days as well as productive cough, subjective fever, and reported O2 sat by EMS of 85% on RA.  Pt does not wear home O2. Pt has CT PE study after d-diemr elevated, and was found to be negative for PE, but did have mild cardiomegaly and edema. Pt still w/ O2 requirement, 89% on RA, tachycardic.  Have added on BNP and also ordered albuterol MDI, although pt reportedly did not improved w/ neb en route.   7:34 PM Nursing reports pt very sleepy, dropped to 91% on RA.  Pt has had multiple doses of narcotics, will try Narcan.   10:08 PM Narcan improved above condition, was able to eat dinner, awake and alter but will borderline SBP in 90-100's.  Pt's PCP called for admission for hypoxia and ABG ordered.  Will admit to step down.  pCO2 77.2.  Will start trial of BiPAP.   10:37AM Pt w/ RR rate 8.  Will give second dose of narcan while waiting for step down bed.     1. Hypoxia   2. Positive D dimer      Toy Cookey, MD 08/06/12 [phone]

## 2012-08-06 NOTE — H&P (Signed)
[name] is an 62 y.o. female.   Chief Complaint: Progressive increasing shortness of breath associated with cough and pleuritic chest pain/leg pain HPI: Patient is 62 year old female with past medical history significant for multiple medical problems i.e. hypertension, history of paroxysmal atrial flutter, COPD, obstructive sleep apnea/obesity hypoventilation syndrome, hypothyroidism, tobacco abuse, morbid obesity, history of hiatus hernia, seizure disorder, history of schizoaffective disorder, came to the ER complaining of progressive increasing shortness of breath associated with cough and chills for last few days associated with pleuritic chest pain. Patient also complains of leg pain and swelling and was noted to have mildly elevated d-dimer and was noted to be hypoxic subsequently had a spiral CT of the chest which was negative for pulmonary embolism. Patient denies any anginal chest pain nausea vomiting diaphoresis. Denies any palpitation lightheadedness or syncope. Patient noncompliant to medication diet and follow up. Patient denies any weakness in the arms or legs.  Past Medical History  Diagnosis Date  . Schizoaffective disorder   . Urinary tract infection   . Hypertension   . GERD (gastroesophageal reflux disease)   . Hypothyroidism   . Obesity   . Asthma   . COPD (chronic obstructive pulmonary disease)   . CHF (congestive heart failure)   . Seizures   . Morbid obesity   . Chronic pain   . Hypercholesteremia   . Anginal pain   . Myocardial infarction 2002  . Pneumonia     "several times" (05/23/2012)  . Diabetes mellitus     "I'm not diabetic anymore" (05/23/2012)  . Anemia   . History of blood transfusion 1993    "when I had my hysterectomy" (05/23/2012)  . H/O hiatal hernia   . Daily headache   . Arthritis     "severe; all over my body" (05/23/2012)  . Hypoventilation syndrome     [name] 05/30/2012  . Atrial fibrillation   . Atrial flutter, paroxysmal     [name]  05/30/2012  . Exertional dyspnea   . Shortness of breath     "all the time lately" (05/30/2012)    Past Surgical History  Procedure Laterality Date  . Vaginal hysterectomy  1993  . Tubal ligation  1998  . Cholecystectomy  ?2008  . Cardiac catheterization  2008    Family History  Problem Relation Age of Onset  . Heart disease Father   . Cancer Mother 5    Breast   Social History:  reports that she quit smoking about 12 years ago. Her smoking use included Cigarettes. She has a 24 pack-year smoking history. Her smokeless tobacco use includes Snuff and Chew. She reports that she does not drink alcohol or use illicit drugs.  Allergies:  Allergies  Allergen Reactions  . Food Rash    "lasagna"= rash from the noodles  . Penicillins Hives  . Sulfamethoxazole W-Trimethoprim Hives and Nausea And Vomiting     (Not in a hospital admission)  Results for orders placed during the hospital encounter of 08/06/12 (from the past 48 hour(s))  CBC     Status: Abnormal   Collection Time    08/06/12  2:37 PM      Result Value Range   WBC 4.3  4.0 - 10.5 K/uL   RBC 4.34  3.87 - 5.11 MIL/uL   Hemoglobin 14.1  12.0 - 15.0 g/dL   HCT 44.0  10.2 - 72.5 %   MCV 101.8 (*) 78.0 - 100.0 fL   MCH 32.5  26.0 - 34.0 pg  MCHC 31.9  30.0 - 36.0 g/dL   RDW 45.4  09.8 - 11.9 %   Platelets 128 (*) 150 - 400 K/uL  COMPREHENSIVE METABOLIC PANEL     Status: Abnormal   Collection Time    08/06/12  2:37 PM      Result Value Range   Sodium 139  135 - 145 mEq/L   Potassium 3.6  3.5 - 5.1 mEq/L   Chloride 99  96 - 112 mEq/L   CO2 31  19 - 32 mEq/L   Glucose, Bld 96  70 - 99 mg/dL   BUN 8  6 - 23 mg/dL   Creatinine, Ser 1.47  0.50 - 1.10 mg/dL   Calcium 9.9  8.4 - 82.9 mg/dL   Total Protein 6.3  6.0 - 8.3 g/dL   Albumin 3.0 (*) 3.5 - 5.2 g/dL   AST 42 (*) 0 - 37 U/L   ALT 22  0 - 35 U/L   Alkaline Phosphatase 45  39 - 117 U/L   Total Bilirubin 0.5  0.3 - 1.2 mg/dL   GFR calc non Af Amer >90  >90  mL/min   GFR calc Af Amer >90  >90 mL/min   Comment:            The eGFR has been calculated     using the CKD EPI equation.     This calculation has not been     validated in all clinical     situations.     eGFR's persistently     <90 mL/min signify     possible Chronic Kidney Disease.  D-DIMER, QUANTITATIVE     Status: Abnormal   Collection Time    08/06/12  2:37 PM      Result Value Range   D-Dimer, Quant 1.10 (*) 0.00 - 0.48 ug/mL-FEU   Comment:            AT THE INHOUSE ESTABLISHED CUTOFF     VALUE OF 0.48 ug/mL FEU,     THIS ASSAY HAS BEEN DOCUMENTED     IN THE LITERATURE TO HAVE     A SENSITIVITY AND NEGATIVE     PREDICTIVE VALUE OF AT LEAST     98 TO 99%.  THE TEST RESULT     SHOULD BE CORRELATED WITH     AN ASSESSMENT OF THE CLINICAL     PROBABILITY OF DVT / VTE.  PRO B NATRIURETIC PEPTIDE     Status: None   Collection Time    08/06/12  2:37 PM      Result Value Range   Pro B Natriuretic peptide (BNP) 80.5  0 - 125 pg/mL  POCT I-STAT TROPONIN I     Status: None   Collection Time    08/06/12  3:24 PM      Result Value Range   Troponin i, poc 0.00  0.00 - 0.08 ng/mL   Comment 3            Comment: Due to the release kinetics of cTnI,     a negative result within the first hours     of the onset of symptoms does not rule out     myocardial infarction with certainty.     If myocardial infarction is still suspected,     repeat the test at appropriate intervals.  URINALYSIS, ROUTINE W REFLEX MICROSCOPIC     Status: Abnormal   Collection Time    08/06/12  3:37 PM  Result Value Range   Color, Urine YELLOW  YELLOW   APPearance CLEAR  CLEAR   Specific Gravity, Urine 1.014  1.005 - 1.030   pH 6.5  5.0 - 8.0   Glucose, UA NEGATIVE  NEGATIVE mg/dL   Hgb urine dipstick NEGATIVE  NEGATIVE   Bilirubin Urine NEGATIVE  NEGATIVE   Ketones, ur NEGATIVE  NEGATIVE mg/dL   Protein, ur NEGATIVE  NEGATIVE mg/dL   Urobilinogen, UA 1.0  0.0 - 1.0 mg/dL   Nitrite NEGATIVE   NEGATIVE   Leukocytes, UA SMALL (*) NEGATIVE  URINE MICROSCOPIC-ADD ON     Status: Abnormal   Collection Time    08/06/12  3:37 PM      Result Value Range   Squamous Epithelial / LPF MANY (*) RARE   WBC, UA 3-6  <3 WBC/hpf   RBC / HPF 0-2  <3 RBC/hpf   Bacteria, UA RARE  RARE  POCT I-STAT TROPONIN I     Status: None   Collection Time    08/06/12  6:18 PM      Result Value Range   Troponin i, poc 0.00  0.00 - 0.08 ng/mL   Comment 3            Comment: Due to the release kinetics of cTnI,     a negative result within the first hours     of the onset of symptoms does not rule out     myocardial infarction with certainty.     If myocardial infarction is still suspected,     repeat the test at appropriate intervals.  POCT I-STAT TROPONIN I     Status: None   Collection Time    08/06/12  9:05 PM      Result Value Range   Troponin i, poc 0.00  0.00 - 0.08 ng/mL   Comment 3            Comment: Due to the release kinetics of cTnI,     a negative result within the first hours     of the onset of symptoms does not rule out     myocardial infarction with certainty.     If myocardial infarction is still suspected,     repeat the test at appropriate intervals.   Dg Chest 2 View  08/06/2012  *RADIOLOGY REPORT*  Clinical Data: Shortness of breath and weakness  CHEST - 2 VIEW  Comparison: 08/01/2012  Findings: Normal heart size.  No pleural effusion or edema.  Lung volumes are low.  There is no airspace consolidation.  Mild spondylosis identified within the thoracic spine.  IMPRESSION:  1.  No acute cardiopulmonary abnormalities. 2.  Thoracic spondylosis.   Original Report Authenticated By: Signa Kell, M.D.    Ct Angio Chest Pe W/cm &/or Wo Cm  08/06/2012  *RADIOLOGY REPORT*  Clinical Data: Shortness of breath.  Rule out pulmonary embolus. Hemoptysis.  CT ANGIOGRAPHY CHEST  Technique:  Multidetector CT imaging of the chest using the standard protocol during bolus administration of  intravenous contrast. Multiplanar reconstructed images including MIPs were obtained and reviewed to evaluate the vascular anatomy.  Contrast: OMNIPAQUE IOHEXOL 350 MG/ML SOLN  Comparison: Chest x-ray from the same day.  Findings: Pulmonary arterial opacification is satisfactory.  There is significant streak artifact secondary patient body habitus.  No focal filling defects are evident to suggest pulmonary embolus. The heart is enlarged.  No significant pleural or pericardial effusion is present.  No significant mediastinal or axillary adenopathy  is present. Limited imaging of the upper abdomen is unremarkable.  The mild dependent atelectasis is present bilaterally.  There is some ground-glass attenuation suggesting edema.  The bone windows demonstrate mild endplate degenerative change.  IMPRESSION:  1.  No evidence of pulmonary embolus. 2.  Cardiomegaly and mild edema. 3.  Mild dependent atelectasis bilaterally.   Original Report Authenticated By: Marin Roberts, M.D.     Review of Systems  Constitutional: Positive for chills and malaise/fatigue. Negative for fever.  Respiratory: Positive for cough, sputum production and shortness of breath.   Cardiovascular: Positive for chest pain and leg swelling. Negative for palpitations, orthopnea and claudication.  Gastrointestinal: Negative for nausea, vomiting and abdominal pain.  Genitourinary: Negative for dysuria and urgency.  Neurological: Positive for dizziness. Negative for headaches.    Blood pressure 98/54, pulse 110, temperature 98.7 F (37.1 C), temperature source Oral, resp. rate 14, height 5\' 2"  (1.575 m), weight 155.13 kg (342 lb), SpO2 99.00%. Physical Exam  Constitutional: She is oriented to person, place, and time. She appears well-developed and well-nourished.  HENT:  Head: Normocephalic and atraumatic.  Nose: Nose normal.  Mouth/Throat: No oropharyngeal exudate.  Eyes: Conjunctivae and EOM are normal. Pupils are equal, round,  and reactive to light. Left eye exhibits no discharge. No scleral icterus.  Neck: Neck supple. No JVD present. No tracheal deviation present. No thyromegaly present.  Cardiovascular:  Sinus tach on the monitor S1 and S2 soft there was soft systolic murmur at left lower sternal border no S3 gallop  Respiratory:  Decreased breath sound at bases with occasional rhonchi  GI: Soft. Bowel sounds are normal. She exhibits distension. There is no tenderness. There is no rebound.  Musculoskeletal:  No clubbing cyanosis trace edema noted  Neurological: She is alert and oriented to person, place, and time.     Assessment/Plan Exacerbation of COPD rule out pneumonia Hypotension probably secondary to medications History of hypertension History of paroxysmal atrial flutter Morbid obesity Obesity hypoventilation syndrome/obstructive sleep apnea Hypothyroidism History of tobacco abuse Had this hernia History of seizure disorder History of schizoaffective disorder Plan As per orders   Hogan Hoobler N 08/06/2012, 10:06 PM

## 2012-08-06 NOTE — Progress Notes (Signed)
Patient breathing 8 times per minute. Set mandatory rate on BiPAP to 14. Gwyneth Sprout, MD notified at 2230.

## 2012-08-06 NOTE — ED Provider Notes (Signed)
History     CSN: [id number]  Arrival date & time 08/06/12  1256   First MD Initiated Contact with Patient 08/06/12 1258      Chief Complaint  Patient presents with  . Shortness of Breath    (Consider location/radiation/quality/duration/timing/severity/associated sxs/prior treatment) HPI [name] is a 62 year old female who is well-known to our facility for her chronic leg pain and has a past medical history of schizoaffective disorder  presents for chief complaint of shortness of breath.  Patient is a poor historian.  It is difficult to elicit history. Patient states "I am sick."  She states that she was coughing all night that she had an episode of hemoptysis this morning.  Patient states that her home health nurse had her come to the emergency department via EMS for low oxygen saturation levels.  Patient states that she has pain substernally when she takes a deep breath.  She denies any nausea vomiting or diaphoresis.  Patient has chronic leg pain and swelling but no difference in leg size or pain.  She has no history of clots, however the patient remains mostly and mobile at home.  EMS records indicate that her oxygen saturation was 85%  no improvement with nebulizer treatment.  Patient did improve with oxygen 4 L via nasal cannula. Denies fevers, chills, myalgias, arthralgias. Denies DOE, SOB, chest tightness or pressure, radiation to left arm, jaw or back, or diaphoresis. Denies dysuria, flank pain, suprapubic pain, frequency, urgency, or hematuria. Denies headaches, light headedness, weakness, visual disturbances. Denies abdominal pain, nausea, vomiting, diarrhea or constipation.   Past Medical History  Diagnosis Date  . Schizoaffective disorder   . Urinary tract infection   . Hypertension   . GERD (gastroesophageal reflux disease)   . Hypothyroidism   . Obesity   . Asthma   . COPD (chronic obstructive pulmonary disease)   . CHF (congestive heart failure)   . Seizures   .  Morbid obesity   . Chronic pain   . Hypercholesteremia   . Anginal pain   . Myocardial infarction 2002  . Pneumonia     "several times" (05/23/2012)  . Diabetes mellitus     "I'm not diabetic anymore" (05/23/2012)  . Anemia   . History of blood transfusion 1993    "when I had my hysterectomy" (05/23/2012)  . H/O hiatal hernia   . Daily headache   . Arthritis     "severe; all over my body" (05/23/2012)  . Hypoventilation syndrome     [name] 05/30/2012  . Atrial fibrillation   . Atrial flutter, paroxysmal     [name] 05/30/2012  . Exertional dyspnea   . Shortness of breath     "all the time lately" (05/30/2012)    Past Surgical History  Procedure Laterality Date  . Vaginal hysterectomy  1993  . Tubal ligation  1998  . Cholecystectomy  ?2008  . Cardiac catheterization  2008    Family History  Problem Relation Age of Onset  . Heart disease Father   . Cancer Mother 8    Breast    History  Substance Use Topics  . Smoking status: Former Smoker -- 2.00 packs/day for 12 years    Types: Cigarettes    Quit date: 06/29/2000  . Smokeless tobacco: Current User    Types: Snuff, Chew     Comment: 05/30/2012 "still have what you gave me last time"  . Alcohol Use: No    OB History   Grav Para Term Preterm Abortions  TAB SAB Ect Mult Living                  Review of Systems Ten systems reviewed and are negative for acute change, except as noted in the HPI.   Allergies  Food; Penicillins; and Sulfamethoxazole w-trimethoprim  Home Medications   Current Outpatient Rx  Name  Route  Sig  Dispense  Refill  . albuterol (PROVENTIL HFA;VENTOLIN HFA) 108 (90 BASE) MCG/ACT inhaler   Inhalation   Inhale 1 puff into the lungs every 6 (six) hours as needed (for wheezing). For wheezing         . aspirin EC 81 MG tablet   Oral   Take 81 mg by mouth every morning.          . budesonide (PULMICORT) 0.25 MG/2ML nebulizer solution   Nebulization   Take 0.25 mg by nebulization 2  (two) times daily.         . chlorproMAZINE (THORAZINE) 50 MG tablet   Oral   Take 50 mg by mouth 2 (two) times daily.         . divalproex (DEPAKOTE) 500 MG DR tablet   Oral   Take 1,000 mg by mouth 2 (two) times daily. For bipolar mood disorder.         . fluticasone (FLONASE) 50 MCG/ACT nasal spray   Nasal   Place 2 sprays into the nose daily as needed. For nasal congestion         . folic acid (FOLVITE) 1 MG tablet   Oral   Take 1 mg by mouth every morning. For nutritional supplement.         Marland Kitchen ibuprofen (ADVIL,MOTRIN) 200 MG tablet   Oral   Take 400 mg by mouth daily as needed (for pain). For pain         . levofloxacin (LEVAQUIN) 750 MG tablet   Oral   Take 1 tablet (750 mg total) by mouth daily.   3 tablet   0   . levothyroxine (SYNTHROID, LEVOTHROID) 150 MCG tablet   Oral   Take 150 mcg by mouth every morning. For thyroid disease.         . metoprolol succinate (TOPROL-XL) 50 MG 24 hr tablet   Oral   Take 50 mg by mouth every morning. Take with or immediately following a meal.         . Multiple Vitamin (MULTIVITAMIN WITH MINERALS) TABS   Oral   Take 1 tablet by mouth every morning. For nutritional supplement.         . nitroGLYCERIN (NITROSTAT) 0.4 MG SL tablet   Sublingual   Place 0.4 mg under the tongue every 5 (five) minutes as needed. For chest pain         . omeprazole (PRILOSEC) 20 MG capsule   Oral   Take 20 mg by mouth every morning.          . ondansetron (ZOFRAN-ODT) 8 MG disintegrating tablet   Oral   Take 8 mg by mouth every 8 (eight) hours as needed for nausea (for nausea).         . paliperidone ([name]) 6 MG 24 hr tablet   Oral   Take 6 mg by mouth 2 (two) times daily.         . polyethylene glycol (MIRALAX / GLYCOLAX) packet   Oral   Take 17 g by mouth every 3 (three) days. constipation         .  traMADol (ULTRAM) 50 MG tablet   Oral   Take 50-100 mg by mouth every 6 (six) hours as needed (for pain).  For pain           BP 125/85  Pulse 102  Temp(Src) 98.6 F (37 C) (Oral)  Ht 5\' 2"  (1.575 m)  Wt 342 lb (155.13 kg)  BMI 62.54 kg/m2  SpO2 96%  Physical Exam  Physical Exam  Nursing note and vitals reviewed. Constitutional: She is oriented to person, place, and time.  Morbidly obese female in no acute distress HENT:  Head: Normocephalic and atraumatic.  Eyes: Conjunctivae normal and EOM are normal. Pupils are equal, round, and reactive to light. No scleral icterus.   Neck: Normal range of motion.  Cardiovascular tachycardic regular rhythm and normal heart sounds.  Exam reveals no gallop and no friction rub.  Distal pulses intact.  Grossly swollen legs bilaterally.  Skin thickening indicates chronic edema of the tissue.  No signs of DVT.  Homan sign negative.  No signs of infection. No murmur heard. Pulmonary/Chest: Effort normal and breath sounds normal. No respiratory distress.  no wheezes.  Rhonchi that clear with cough Abdominal: Soft. Bowel sounds are normal. She exhibits no distension and no mass. There is no tenderness. There is no guarding.  Neurological: She is alert and oriented to person, place, and time.  Skin: Skin is warm and dry. She is not diaphoretic.    ED Course  Procedures (including critical care time)  Labs Reviewed  CBC - Abnormal; Notable for the following:    MCV 101.8 (*)    Platelets 128 (*)    All other components within normal limits  COMPREHENSIVE METABOLIC PANEL - Abnormal; Notable for the following:    Albumin 3.0 (*)    AST 42 (*)    All other components within normal limits  D-DIMER, QUANTITATIVE - Abnormal; Notable for the following:    D-Dimer, Quant 1.10 (*)    All other components within normal limits  URINALYSIS, ROUTINE W REFLEX MICROSCOPIC - Abnormal; Notable for the following:    Leukocytes, UA SMALL (*)    All other components within normal limits  URINE MICROSCOPIC-ADD ON - Abnormal; Notable for the following:    Squamous  Epithelial / LPF MANY (*)    All other components within normal limits  POCT I-STAT TROPONIN I   No results found.   No diagnosis found.    MDM  2:09 PM BP 125/85  Pulse 102  Temp(Src) 98.6 F (37 C) (Oral)  Ht 5\' 2"  (1.575 m)  Wt 342 lb (155.13 kg)  BMI 62.54 kg/m2  SpO2 96% Patient with complaint of chest pain and low oxygen saturations.  Her chronic immobility Along with morbid obesity but certainly risk for possible pulmonary embolism along with her complaint of hemoptysis.  Patient also has risk factors for acute coronary syndrome although the patient presentation would be atypical.  Patient may also have developing pneumonia.  She is afebrile at this point.  Labs are pending.  Oxygen saturations are stable on 2 L via nasal cannula.      3:47 PM Patient with borderliine elevated D Dimer. She has c/o hypoxia, pleuritic chest pain and hemoptysis.  CT angio of the chest back in December was negative but segmental and subsegmental branches were poorly assessed due to patient body habitus and motion.  Considering the patient's risk factors and presentation I feel a repeat CT angio is indicated.    4:22 PM Patient  with pending labs.  Work up to r/oPE.  CT angio chest pending.  I have given report to Dr. Micheline Maze who will assume care of the patient.  Arthor Captain, PA-C 08/06/12 1623

## 2012-08-06 NOTE — ED Notes (Signed)
Per ems- Pt comes from home, home health RN out to see patient, noted her oxygen saturation was 85%, did not improve with breathing treatment. When EMS arrived placed on 4 L, improved to 94%. Denies cp or other discomfort. BP 130/108, HR 100. Pt is alert and oriented.

## 2012-08-06 NOTE — ED Provider Notes (Signed)
Medical screening examination/treatment/procedure(s) were performed by non-physician practitioner and as supervising physician I was immediately available for consultation/collaboration.  Marwan T Powers, MD 08/06/12 1650 

## 2012-08-07 ENCOUNTER — Encounter (HOSPITAL_COMMUNITY): Payer: Self-pay | Admitting: *Deleted

## 2012-08-07 DIAGNOSIS — R0902 Hypoxemia: Secondary | ICD-10-CM

## 2012-08-07 DIAGNOSIS — G4733 Obstructive sleep apnea (adult) (pediatric): Secondary | ICD-10-CM

## 2012-08-07 DIAGNOSIS — R4182 Altered mental status, unspecified: Secondary | ICD-10-CM

## 2012-08-07 DIAGNOSIS — J9600 Acute respiratory failure, unspecified whether with hypoxia or hypercapnia: Secondary | ICD-10-CM

## 2012-08-07 LAB — POCT I-STAT 3, ART BLOOD GAS (G3+)
Acid-Base Excess: 1 mmol/L (ref 0.0–2.0)
O2 Saturation: 89 %
TCO2: 34 mmol/L (ref 0–100)
pO2, Arterial: 72 mmHg — ABNORMAL LOW (ref 80.0–100.0)

## 2012-08-07 LAB — COMPREHENSIVE METABOLIC PANEL
Alkaline Phosphatase: 44 U/L (ref 39–117)
BUN: 8 mg/dL (ref 6–23)
CO2: 30 mEq/L (ref 19–32)
GFR calc Af Amer: >90 mL/min (ref >90)
GFR calc non Af Amer: 89 mL/min — ABNORMAL LOW (ref >90)
Glucose, Bld: 100 mg/dL — ABNORMAL HIGH (ref 70–99)
Potassium: 3.8 mEq/L (ref 3.5–5.1)
Total Bilirubin: 0.3 mg/dL (ref 0.3–1.2)
Total Protein: 6.3 g/dL (ref 6.0–8.3)

## 2012-08-07 LAB — BLOOD GAS, ARTERIAL
Acid-Base Excess: 2.8 mmol/L — ABNORMAL HIGH (ref 0.0–2.0)
Bicarbonate: 29.4 mEq/L — ABNORMAL HIGH (ref 20.0–24.0)
O2 Saturation: 98.9 %
Patient temperature: 98.6
TCO2: 31.5 mmol/L (ref 0–100)
pO2, Arterial: 98.9 mmHg (ref 80.0–100.0)

## 2012-08-07 LAB — TSH: TSH: 4.762 u[IU]/mL — ABNORMAL HIGH (ref 0.350–4.500)

## 2012-08-07 MED ORDER — GUAIFENESIN ER 600 MG PO TB12
600.0000 mg | ORAL_TABLET | Freq: Two times a day (BID) | ORAL | Status: DC
  Administered 2012-08-07 – 2012-08-08 (×3): 600 mg via ORAL
  Filled 2012-08-07 (×5): qty 1

## 2012-08-07 MED ORDER — POLYETHYLENE GLYCOL 3350 17 G PO PACK
17.0000 g | PACK | ORAL | Status: DC
  Administered 2012-08-10 – 2012-08-13 (×2): 17 g via ORAL
  Filled 2012-08-07 (×3): qty 1

## 2012-08-07 MED ORDER — LEVALBUTEROL HCL 1.25 MG/0.5ML IN NEBU
1.2500 mg | INHALATION_SOLUTION | Freq: Four times a day (QID) | RESPIRATORY_TRACT | Status: DC | PRN
  Filled 2012-08-07: qty 0.5

## 2012-08-07 MED ORDER — FLUTICASONE PROPIONATE 50 MCG/ACT NA SUSP: 2.0000 | Freq: Every day | NASAL | Status: DC | PRN

## 2012-08-07 MED ORDER — NALOXONE HCL 1 MG/ML IJ SOLN
1.0000 mg/h | INTRAVENOUS | Status: DC
  Administered 2012-08-07: 1 mg/h via INTRAVENOUS
  Filled 2012-08-07 (×2): qty 4

## 2012-08-07 MED ORDER — ENOXAPARIN SODIUM 40 MG/0.4ML ~~LOC~~ SOLN
40.0000 mg | SUBCUTANEOUS | Status: DC
  Administered 2012-08-07 – 2012-08-14 (×8): 40 mg via SUBCUTANEOUS
  Filled 2012-08-07 (×8): qty 0.4

## 2012-08-07 MED ORDER — TRAMADOL HCL 50 MG PO TABS
50.0000 mg | ORAL_TABLET | Freq: Four times a day (QID) | ORAL | Status: DC | PRN
  Administered 2012-08-08 – 2012-08-13 (×7): 50 mg via ORAL
  Filled 2012-08-07 (×9): qty 1

## 2012-08-07 MED ORDER — FOLIC ACID 1 MG PO TABS
1.0000 mg | ORAL_TABLET | Freq: Every morning | ORAL | Status: DC
  Administered 2012-08-07 – 2012-08-14 (×7): 1 mg via ORAL
  Filled 2012-08-07 (×8): qty 1

## 2012-08-07 MED ORDER — SODIUM CHLORIDE 0.9 % IV SOLN: INTRAVENOUS | Status: DC

## 2012-08-07 MED ORDER — SODIUM CHLORIDE 0.9 % IV BOLUS (SEPSIS)
500.0000 mL | Freq: Once | INTRAVENOUS | Status: AC
  Administered 2012-08-07: 500 mL via INTRAVENOUS

## 2012-08-07 MED ORDER — ASPIRIN EC 81 MG PO TBEC
81.0000 mg | DELAYED_RELEASE_TABLET | Freq: Every morning | ORAL | Status: DC
  Administered 2012-08-07 – 2012-08-14 (×7): 81 mg via ORAL
  Filled 2012-08-07 (×8): qty 1

## 2012-08-07 MED ORDER — DIVALPROEX SODIUM 500 MG PO DR TAB
1000.0000 mg | DELAYED_RELEASE_TABLET | Freq: Two times a day (BID) | ORAL | Status: DC
  Administered 2012-08-07 – 2012-08-14 (×15): 1000 mg via ORAL
  Filled 2012-08-07 (×17): qty 2

## 2012-08-07 MED ORDER — PANTOPRAZOLE SODIUM 40 MG PO TBEC
40.0000 mg | DELAYED_RELEASE_TABLET | Freq: Every day | ORAL | Status: DC
  Administered 2012-08-07 – 2012-08-14 (×7): 40 mg via ORAL
  Filled 2012-08-07 (×8): qty 1

## 2012-08-07 MED ORDER — LEVOTHYROXINE SODIUM 150 MCG PO TABS
150.0000 ug | ORAL_TABLET | Freq: Every day | ORAL | Status: DC
  Administered 2012-08-07 – 2012-08-14 (×8): 150 ug via ORAL
  Filled 2012-08-07 (×10): qty 1

## 2012-08-07 MED ORDER — ADULT MULTIVITAMIN W/MINERALS CH
1.0000 | ORAL_TABLET | Freq: Every morning | ORAL | Status: DC
  Administered 2012-08-07 – 2012-08-14 (×7): 1 via ORAL
  Filled 2012-08-07 (×8): qty 1

## 2012-08-07 MED ORDER — ONDANSETRON HCL 4 MG/2ML IJ SOLN: 4.0000 mg | Freq: Four times a day (QID) | INTRAMUSCULAR | Status: DC | PRN

## 2012-08-07 NOTE — Progress Notes (Signed)
Patient has removed bipap mask and is refusing to put bipap back on. Placed patient on 4lpm [place]. Patient is tolerating nasal cannula well at this time.

## 2012-08-07 NOTE — Consult Note (Signed)
PULMONARY  / CRITICAL CARE MEDICINE  Name: [name] MRN: [id number] DOB: 10/17/1950    ADMISSION DATE:  08/06/2012 CONSULTATION DATE:  08/07/2012  REFERRING MD :  Sharyn Lull  CHIEF COMPLAINT:  Hypercarbic respiratory failure  BRIEF PATIENT DESCRIPTION: 62 yo with COPD, CHF, obesity (suspect OHS / OSA), "chronic pain" and multiple previous drug screens positive for opioids brought to [facility] ED hypopneic and hypercarbic.  Demonstrated some response to Narcan injection.  Placed on BiPAP.  SIGNIFICANT EVENTS / STUDIES:  2/18  Admitted for acute on chronic hypercarbic respiratory failure.  BiPAP / Narcan gtt started.  CT angio >>> no PE.  LINES / TUBES:  CULTURES:  ANTIBIOTICS: Levaquin 2/18 >>>  The patient is encephalopathic and unable to provide history, which was obtained for available medical records.  HISTORY OF PRESENT ILLNESS:  62 yo with COPD, CHF, obesity (suspect OHS / OSA), "chronic pain" and multiple previous drug screens positive for opioids brought to [facility] ED hypopneic and hypercarbic.  Demonstrated some response to Narcan injection.  Placed on BiPAP.  PAST MEDICAL HISTORY :  Past Medical History  Diagnosis Date  . Schizoaffective disorder   . Urinary tract infection   . Hypertension   . GERD (gastroesophageal reflux disease)   . Hypothyroidism   . Obesity   . Asthma   . COPD (chronic obstructive pulmonary disease)   . CHF (congestive heart failure)   . Seizures   . Morbid obesity   . Chronic pain   . Hypercholesteremia   . Anginal pain   . Myocardial infarction 2002  . Pneumonia     "several times" (05/23/2012)  . Diabetes mellitus     "I'm not diabetic anymore" (05/23/2012)  . Anemia   . History of blood transfusion 1993    "when I had my hysterectomy" (05/23/2012)  . H/O hiatal hernia   . Daily headache   . Arthritis     "severe; all over my body" (05/23/2012)  . Hypoventilation syndrome     [name] 05/30/2012  . Atrial fibrillation   . Atrial flutter,  paroxysmal     [name] 05/30/2012  . Exertional dyspnea   . Shortness of breath     "all the time lately" (05/30/2012)   Past Surgical History  Procedure Laterality Date  . Vaginal hysterectomy  1993  . Tubal ligation  1998  . Cholecystectomy  ?2008  . Cardiac catheterization  2008   Prior to Admission medications   Medication Sig Start Date End Date Taking? Authorizing Provider  albuterol (PROVENTIL HFA;VENTOLIN HFA) 108 (90 BASE) MCG/ACT inhaler Inhale 2 puffs into the lungs every 6 (six) hours as needed for wheezing.   Yes Historical Provider, MD  aspirin EC 81 MG tablet Take 81 mg by mouth every morning.  05/11/12  Yes Neil Mashburn, PA-C  budesonide (PULMICORT) 0.25 MG/2ML nebulizer solution Take 0.25 mg by nebulization 2 (two) times daily.   Yes Historical Provider, MD  chlorproMAZINE (THORAZINE) 50 MG tablet Take 50 mg by mouth 2 (two) times daily.   Yes Historical Provider, MD  divalproex (DEPAKOTE) 500 MG DR tablet Take 1,000 mg by mouth 2 (two) times daily. For bipolar mood disorder.   Yes Verne Spurr, PA-C  fluticasone (FLONASE) 50 MCG/ACT nasal spray Place 2 sprays into the nose daily as needed. For nasal congestion   Yes Historical Provider, MD  folic acid (FOLVITE) 1 MG tablet Take 1 mg by mouth every morning. For nutritional supplement. 05/11/12  Yes Verne Spurr, PA-C  ibuprofen (ADVIL,MOTRIN) 200 MG tablet Take 400 mg by mouth every 6 (six) hours as needed for pain.   Yes Historical Provider, MD  levothyroxine (SYNTHROID, LEVOTHROID) 150 MCG tablet Take 150 mcg by mouth every morning. For thyroid disease. 05/11/12  Yes Verne Spurr, PA-C  metoprolol succinate (TOPROL-XL) 50 MG 24 hr tablet Take 50 mg by mouth every morning. Take with or immediately following a meal. 06/03/12  Yes [name], MD  Multiple Vitamin (MULTIVITAMIN WITH MINERALS) TABS Take 1 tablet by mouth every morning. For nutritional supplement. 05/11/12  Yes Verne Spurr, PA-C  omeprazole (PRILOSEC)  20 MG capsule Take 20 mg by mouth every morning.  05/11/12  Yes Neil Mashburn, PA-C  ondansetron (ZOFRAN-ODT) 8 MG disintegrating tablet Take 8 mg by mouth every 8 (eight) hours as needed for nausea.   Yes Historical Provider, MD  paliperidone ([name]) 6 MG 24 hr tablet Take 6 mg by mouth 2 (two) times daily.   Yes Historical Provider, MD  polyethylene glycol (MIRALAX / GLYCOLAX) packet Take 17 g by mouth every 3 (three) days. constipation   Yes Historical Provider, MD  traMADol (ULTRAM) 50 MG tablet Take 50 mg by mouth every 6 (six) hours as needed for pain.   Yes Historical Provider, MD  levofloxacin (LEVAQUIN) 750 MG tablet Take 1 tablet (750 mg total) by mouth daily. 08/01/12   Nishant Dhungel, MD  nitroGLYCERIN (NITROSTAT) 0.4 MG SL tablet Place 0.4 mg under the tongue every 5 (five) minutes as needed. For chest pain    Historical Provider, MD   Allergies  Allergen Reactions  . Food Rash    "lasagna"= rash from the noodles  . Penicillins Hives  . Sulfamethoxazole W-Trimethoprim Hives and Nausea And Vomiting   FAMILY HISTORY:  Family History  Problem Relation Age of Onset  . Heart disease Father   . Cancer Mother 73    Breast   SOCIAL HISTORY:  reports that she quit smoking about 12 years ago. Her smoking use included Cigarettes. She has a 24 pack-year smoking history. Her smokeless tobacco use includes Snuff and Chew. She reports that she does not drink alcohol or use illicit drugs.  REVIEW OF SYSTEMS:  Unable to provide  SUBJECTIVE:   VITAL SIGNS: Temp:  [98.1 F (36.7 C)-99.2 F (37.3 C)] 99.2 F (37.3 C) (02/17 2227) Pulse Rate:  [100-125] 116 (02/18 0204) Resp:  [10-20] 14 (02/18 0204) BP: (82-125)/(50-92) 101/50 mmHg (02/18 0204) SpO2:  [83 %-100 %] 100 % (02/18 0204) Weight:  [155.13 kg (342 lb)] 155.13 kg (342 lb) (02/17 1312)  PHYSICAL EXAMINATION: General:  No acute distress, morbidly obese Neuro:  Somnolent, but arouses to stimulation, nonfocal HEENT:  PERRL,  BiPAP mask on Neck:  Cannot assess for JVD Cardiovascular:  Distant heart sounds, regular Lungs:  Diminished air entry bilaterally, no w/r/r Abdomen:  Obese, soft Musculoskeletal:  Moves all extremities, trace edema Skin:  Intact  Recent Labs Lab 08/01/12 0450 08/01/12 2317 08/06/12 1437 08/06/12 2205 08/07/12 0121  HGB 13.3 13.5 14.1  --   --   WBC 4.5 4.5 4.3  --   --   PLT 107* 106* 128*  --   --   NA 131*  --  139  --   --   K 4.0  --  3.6  --   --   CL 93*  --  99  --   --   CO2 34*  --  31  --   --  GLUCOSE 94  --  96  --   --   BUN 13  --  8  --   --   CREATININE 0.67  --  0.67  --   --   CALCIUM 9.3  --  9.9  --   --   AST  --   --  42*  --   --   ALT  --   --  22  --   --   ALKPHOS  --   --  45  --   --   BILITOT  --   --  0.5  --   --   PROT  --   --  6.3  --   --   ALBUMIN  --   --  3.0*  --   --   PHART  --   --   --  7.222* 7.202*  PCO2ART  --   --   --  77.2* 81.5*  PO2ART  --   --   --  75.0* 72.0*  HCO3  --   --   --  31.7* 31.9*  O2SAT  --   --   --  91.0 89.0   Imaging: Dg Chest 2 View  08/06/2012  *RADIOLOGY REPORT*  Clinical Data: Shortness of breath and weakness  CHEST - 2 VIEW  Comparison: 08/01/2012  Findings: Normal heart size.  No pleural effusion or edema.  Lung volumes are low.  There is no airspace consolidation.  Mild spondylosis identified within the thoracic spine.  IMPRESSION:  1.  No acute cardiopulmonary abnormalities. 2.  Thoracic spondylosis.   Original Report Authenticated By: Signa Kell, M.D.    Ct Angio Chest Pe W/cm &/or Wo Cm  08/06/2012  *RADIOLOGY REPORT*  Clinical Data: Shortness of breath.  Rule out pulmonary embolus. Hemoptysis.  CT ANGIOGRAPHY CHEST  Technique:  Multidetector CT imaging of the chest using the standard protocol during bolus administration of intravenous contrast. Multiplanar reconstructed images including MIPs were obtained and reviewed to evaluate the vascular anatomy.  Contrast: OMNIPAQUE IOHEXOL 350  MG/ML SOLN  Comparison: Chest x-ray from the same day.  Findings: Pulmonary arterial opacification is satisfactory.  There is significant streak artifact secondary patient body habitus.  No focal filling defects are evident to suggest pulmonary embolus. The heart is enlarged.  No significant pleural or pericardial effusion is present.  No significant mediastinal or axillary adenopathy is present. Limited imaging of the upper abdomen is unremarkable.  The mild dependent atelectasis is present bilaterally.  There is some ground-glass attenuation suggesting edema.  The bone windows demonstrate mild endplate degenerative change.  IMPRESSION:  1.  No evidence of pulmonary embolus. 2.  Cardiomegaly and mild edema. 3.  Mild dependent atelectasis bilaterally.   Original Report Authenticated By: Marin Roberts, M.D.    ASSESSMENT / PLAN: COPD without evidence of exacerbation CHF systolic / diastolic with evidence of exacerbation / mild pulmonary edema on CXR No evidence of PE / overt pneumonia Morbid obesity, suspect OSA/OHS Acute on chronic hypercarbic respiratory failure, likely exacerbated by opioids Acute encephalopathy (hypercarbic) on the background of schizoaffective disorder  -->  Continue BiPAP, increase IPAP, decrease EPAP, recheck ABG in 4 hours -->  States "I don't want that tube" in case needs to be intubated, but I am not sure about her decision making capacity under the circumstances -->  Bronchodilators PRN -->  No indication for steroids -->  Hold IVF -->  Defer Lasix at this time -->  Narcan gtt -->  Drug screen -->  Would d/c Levaquin if PCT reassuring (ordered) -->  No indication for anticoagulation from PE standpoint (Lovenox d/c'd) -->  Needs clarification of code status / goals of care  Lonia Farber, MD Pulmonary and Critical Care Medicine [facility] Pager: [phone]  08/07/2012, 2:18 AM

## 2012-08-07 NOTE — Progress Notes (Signed)
Pt spoke with chaplain to great extent. Pt asking nursing staff to call police department "because somebody has been giving her drugs at home." RN spoke with boyfriend who says pt has citation for calling PD so frequently. Pt denying any thoughts of harming self or others and can quickly re-focus when prompted. Will cont to monitor pt. [name] L

## 2012-08-07 NOTE — ED Notes (Addendum)
Report given to Maralyn Sago RN, with updates. RT at bedside to transport pt upstairs.

## 2012-08-07 NOTE — Progress Notes (Signed)
Notified Verl Bangs, MD of critical ABG values. RBV pH 7.20, PaCO2 81.5 at 0125 on 08/07/12.

## 2012-08-07 NOTE — ED Notes (Signed)
Pt stated that she does not want a tube to help her breath. MD at bedside. RT at bedside.

## 2012-08-07 NOTE — Progress Notes (Signed)
Utilization Review Completed.  08/07/2012  

## 2012-08-07 NOTE — ED Notes (Addendum)
Critical Care MD at bedside. 3300 called for update.

## 2012-08-07 NOTE — Progress Notes (Signed)
The patient has orders to go to 2600 but step down does not take Narcan drips. Spoke with Dr. Sharyn Lull regarding narcan drip. Dr. Sharyn Lull gave me a verbal order to D/C narcan drip. Will continue to monitor.

## 2012-08-07 NOTE — Consult Note (Signed)
PULMONARY  / CRITICAL CARE MEDICINE  Name: [name] MRN: [id number] DOB: November 28, 1950    ADMISSION DATE:  Aug 22, 2012 CONSULTATION DATE:  08/07/2012  REFERRING MD :  Sharyn Lull  CHIEF COMPLAINT:  Hypercarbic respiratory failure  BRIEF PATIENT DESCRIPTION:  62 yo female former smoker admitted with acute on chronic hypercapnic respiratory failure.  PCCM consulted to assist with COPD, OSA management.  Previously followed by Dr. Craige Cotta as outpt (Last seen in pulmonary office 08/25/10).  Significant PMHx of COPD, Grade 1 diastolic heart failure, Chronic pain, Schizo-affective disorder, HTN, Hypothyroidism GERD, Seizures, Morbid obesity, Hyperlipidemia, DM, A fib/flutter  SIGNIFICANT EVENTS: 2/18  Admitted for acute on chronic hypercarbic respiratory failure.  BiPAP / Narcan gtt started  STUDIES: 01/10/10 PSG >> AHI 14.4 2012-08-22 CT angio chest >> no PE.  ANTIBIOTICS: Levaquin 2/18 >>>  SUBJECTIVE:  More alert.  Off narcan.  Refusing BiPAP overnight.  VITAL SIGNS: Temp:  [98.1 F (36.7 C)-99.4 F (37.4 C)] 99.4 F (37.4 C) (02/18 0742) Pulse Rate:  [100-125] 101 (02/18 0900) Resp:  [10-20] 13 (02/18 0900) BP: (77-125)/(34-104) 120/104 mmHg (02/18 0900) SpO2:  [83 %-100 %] 100 % (02/18 0900) Weight:  [326 lb 8 oz (148.1 kg)-342 lb (155.13 kg)] 326 lb 8 oz (148.1 kg) (02/18 0300)  PHYSICAL EXAMINATION: General:  No distress Neuro:  Alert, follows commands HEENT: No sinus tenderness Cardiovascular:  s1s2 regular, no murmur Lungs:  Decreased breath sounds, no wheeze/rales Abdomen:  Obese, soft Musculoskeletal:  Moves all extremities, 2+ non-pitting edema Skin:  No rashes   Recent Labs Lab 08/01/12 0450 08/01/12 2317 08/22/2012 1437 08/22/12 2205 08/07/12 0121 08/07/12 0216 08/07/12 0740  HGB 13.3 13.5 14.1  --   --   --   --   WBC 4.5 4.5 4.3  --   --   --   --   PLT 107* 106* 128*  --   --   --   --   NA 131*  --  139  --   --  141  --   K 4.0  --  3.6  --   --  3.8   --   CL 93*  --  99  --   --  102  --   CO2 34*  --  31  --   --  30  --   GLUCOSE 94  --  96  --   --  100*  --   BUN 13  --  8  --   --  8  --   CREATININE 0.67  --  0.67  --   --  0.79  --   CALCIUM 9.3  --  9.9  --   --  9.3  --   AST  --   --  42*  --   --  36  --   ALT  --   --  22  --   --  21  --   ALKPHOS  --   --  45  --   --  44  --   BILITOT  --   --  0.5  --   --  0.3  --   PROT  --   --  6.3  --   --  6.3  --   ALBUMIN  --   --  3.0*  --   --  2.8*  --   PHART  --   --   --  7.222* 7.202*  --  7.256*  PCO2ART  --   --   --  77.2* 81.5*  --  68.4*  PO2ART  --   --   --  75.0* 72.0*  --  98.9  HCO3  --   --   --  31.7* 31.9*  --  29.4*  O2SAT  --   --   --  91.0 89.0  --  98.9   Imaging: Dg Chest 2 View  08/06/2012  *RADIOLOGY REPORT*  Clinical Data: Shortness of breath and weakness  CHEST - 2 VIEW  Comparison: 08/01/2012  Findings: Normal heart size.  No pleural effusion or edema.  Lung volumes are low.  There is no airspace consolidation.  Mild spondylosis identified within the thoracic spine.  IMPRESSION:  1.  No acute cardiopulmonary abnormalities. 2.  Thoracic spondylosis.   Original Report Authenticated By: Signa Kell, M.D.    Ct Angio Chest Pe W/cm &/or Wo Cm  08/06/2012  *RADIOLOGY REPORT*  Clinical Data: Shortness of breath.  Rule out pulmonary embolus. Hemoptysis.  CT ANGIOGRAPHY CHEST  Technique:  Multidetector CT imaging of the chest using the standard protocol during bolus administration of intravenous contrast. Multiplanar reconstructed images including MIPs were obtained and reviewed to evaluate the vascular anatomy.  Contrast: OMNIPAQUE IOHEXOL 350 MG/ML SOLN  Comparison: Chest x-ray from the same day.  Findings: Pulmonary arterial opacification is satisfactory.  There is significant streak artifact secondary patient body habitus.  No focal filling defects are evident to suggest pulmonary embolus. The heart is enlarged.  No significant pleural or  pericardial effusion is present.  No significant mediastinal or axillary adenopathy is present. Limited imaging of the upper abdomen is unremarkable.  The mild dependent atelectasis is present bilaterally.  There is some ground-glass attenuation suggesting edema.  The bone windows demonstrate mild endplate degenerative change.  IMPRESSION:  1.  No evidence of pulmonary embolus. 2.  Cardiomegaly and mild edema. 3.  Mild dependent atelectasis bilaterally.   Original Report Authenticated By: Marin Roberts, M.D.    ASSESSMENT / PLAN:  A: Acute on chronic respiratory failure from narcotic use in setting of reported hx of COPD, OSA/OHS. P: Adjust oxygen to keep SpO2 > 90% Auto-CPAP qhs BD's prn Defer abx to primary team >> likely can d/c soon Will need further assessment of her respiratory status as outpt  A: Chronic pain. Hx of schizo-affective disorder. P: Limit narcotics Resume psych meds per primary team  A: Chronic diastolic CHF. P: Goal negative fluid balance as tolerated  A: Hx of hypothyroidism. P: Continue synthroid per primary team  A: Morbid obesity >> much of her respiratory problems likely related to her weight. P: May need evaluation for bariatric surgery  Agree with plan to transfer to SDU.  PCCM will f/u while in hospital.   Coralyn Helling, MD [facility] Pulmonary/Critical Care 08/07/2012, 10:21 AM Pager:  [phone] After 3pm call: [phone]

## 2012-08-07 NOTE — Progress Notes (Signed)
MD called regarding hypotension; 500 cc bolus ordered.   [name]

## 2012-08-07 NOTE — Progress Notes (Signed)
Subjective:  Patient alert and awake off BiPAP. Denies any chest pain or shortness of breath  Objective:  Vital Signs in the last 24 hours: Temp:  [98.1 F (36.7 C)-99.4 F (37.4 C)] 99.4 F (37.4 C) (02/18 0742) Pulse Rate:  [100-125] 106 (02/18 0800) Resp:  [10-20] 12 (02/18 0742) BP: (77-125)/(34-92) 109/52 mmHg (02/18 0800) SpO2:  [83 %-100 %] 98 % (02/18 0800) Weight:  [148.1 kg (326 lb 8 oz)-155.13 kg (342 lb)] 148.1 kg (326 lb 8 oz) (02/18 0300)  Intake/Output from previous day: 02/17 0701 - 02/18 0700 In: 829.2 [I.V.:179.2; IV Piggyback:650] Out: 250 [Urine:250] Intake/Output from this shift: Total I/O In: 62.5 [I.V.:62.5] Out: -   Physical Exam: Neck: no adenopathy, no carotid bruit, no JVD and supple, symmetrical, trachea midline Lungs: Decreased breath sounds at bases Heart: regular rate and rhythm, S1, S2 normal, no murmur, click, rub or gallop Abdomen: soft, non-tender; bowel sounds normal; no masses,  no organomegaly Extremities: extremities normal, atraumatic, no cyanosis or edema  Lab Results:  Recent Labs  08/06/12 1437  WBC 4.3  HGB 14.1  PLT 128*    Recent Labs  08/06/12 1437 08/07/12 0216  NA 139 141  K 3.6 3.8  CL 99 102  CO2 31 30  GLUCOSE 96 100*  BUN 8 8  CREATININE 0.67 0.79   No results found for this basename: TROPONINI, CK, MB,  in the last 72 hours Hepatic Function Panel  Recent Labs  08/07/12 0216  PROT 6.3  ALBUMIN 2.8*  AST 36  ALT 21  ALKPHOS 44  BILITOT 0.3   No results found for this basename: CHOL,  in the last 72 hours No results found for this basename: PROTIME,  in the last 72 hours  Imaging: Imaging results have been reviewed and Dg Chest 2 View  08/06/2012  *RADIOLOGY REPORT*  Clinical Data: Shortness of breath and weakness  CHEST - 2 VIEW  Comparison: 08/01/2012  Findings: Normal heart size.  No pleural effusion or edema.  Lung volumes are low.  There is no airspace consolidation.  Mild spondylosis  identified within the thoracic spine.  IMPRESSION:  1.  No acute cardiopulmonary abnormalities. 2.  Thoracic spondylosis.   Original Report Authenticated By: Signa Kell, M.D.    Ct Angio Chest Pe W/cm &/or Wo Cm  08/06/2012  *RADIOLOGY REPORT*  Clinical Data: Shortness of breath.  Rule out pulmonary embolus. Hemoptysis.  CT ANGIOGRAPHY CHEST  Technique:  Multidetector CT imaging of the chest using the standard protocol during bolus administration of intravenous contrast. Multiplanar reconstructed images including MIPs were obtained and reviewed to evaluate the vascular anatomy.  Contrast: OMNIPAQUE IOHEXOL 350 MG/ML SOLN  Comparison: Chest x-ray from the same day.  Findings: Pulmonary arterial opacification is satisfactory.  There is significant streak artifact secondary patient body habitus.  No focal filling defects are evident to suggest pulmonary embolus. The heart is enlarged.  No significant pleural or pericardial effusion is present.  No significant mediastinal or axillary adenopathy is present. Limited imaging of the upper abdomen is unremarkable.  The mild dependent atelectasis is present bilaterally.  There is some ground-glass attenuation suggesting edema.  The bone windows demonstrate mild endplate degenerative change.  IMPRESSION:  1.  No evidence of pulmonary embolus. 2.  Cardiomegaly and mild edema. 3.  Mild dependent atelectasis bilaterally.   Original Report Authenticated By: Marin Roberts, M.D.     Cardiac Studies:  Assessment/Plan:  Status post Acute on chronic hypercarbic respiratory failure worsened  by narcotics Resolving Exacerbation of COPD  Status post Hypotension probably secondary to medications  History of hypertension  History of paroxysmal atrial flutter  Morbid obesity  Obesity hypoventilation syndrome/obstructive sleep apnea  Hypothyroidism  History of tobacco abuse  Had this hernia  History of seizure disorder  History of schizoaffective  disorder Plan Transfer patient to step down unit Avoid narcotics OT PT constant Dietitian consult Social service for discharge planning  LOS: 1 day    Kemyra August N 08/07/2012, 8:52 AM

## 2012-08-07 NOTE — Progress Notes (Signed)
eLink Physician-Brief Progress Note Patient Name: [name] DOB: Dec 31, 1950 MRN: [id number]  Date of Service  08/07/2012   HPI/Events of Note  Hypotension with BP of 81/44 (51) but patient is voiding.  Did receive fluid bolus in ED for hypotension.   eICU Interventions  Plan: 500 cc NS bolus IV for hypotension    Intervention Category Intermediate Interventions: Hypotension - evaluation and management  Kinjal Neitzke 08/07/2012, 5:08 AM

## 2012-08-07 NOTE — ED Notes (Signed)
Report called while RT getting second ABG. Pt second ABG told to admitting MD Harwani, called with results. Consult to Critical made.

## 2012-08-07 NOTE — Progress Notes (Signed)
Mary from lab called and reported a critical lab value; CO2: 68.4. Notified Dr. Craige Cotta. No new orders received. Will continue to monitor.

## 2012-08-07 NOTE — Progress Notes (Signed)
RT Note: Pt refusing CPAP for tonight. I told her if she changes her mind to let RN know and we will get her setup.

## 2012-08-07 NOTE — ED Notes (Signed)
Pt talking over BiPAP machine, requesting water.

## 2012-08-07 NOTE — Progress Notes (Signed)
IPAP increased to 16 per order Zubelevitzky, MD.

## 2012-08-07 NOTE — Progress Notes (Signed)
Pt refusing bipap ABG due at 7am

## 2012-08-07 NOTE — ED Provider Notes (Signed)
I saw and evaluated the patient, reviewed the resident's note and I agree with the findings and plan. I have reviewed EKG and agree with the resident interpretation.  you Patient presenting due to shortness of breath. Patient is hypoxic when removed from oxygen and down 81%. Patient does not wear oxygen routinely at home. Patient is excessively drowsy and does improve with Narcan. There is no signs of PE and patient was admitted for further evaluation  Gwyneth Sprout, MD 08/07/12 [phone]

## 2012-08-07 NOTE — Progress Notes (Signed)
Pt admitted to 3308.  Resp even, unlabored on BiPAP.  Pt complaining of chest pain related to breathing.  Narcan drip ordered.  Charge RN questioned ability to give drip on this unit- pt will be transferred to ICU.   -Maximino Greenland RN

## 2012-08-08 DIAGNOSIS — E669 Obesity, unspecified: Secondary | ICD-10-CM

## 2012-08-08 MED ORDER — LEVOFLOXACIN 750 MG PO TABS
750.0000 mg | ORAL_TABLET | ORAL | Status: DC
  Filled 2012-08-08: qty 1

## 2012-08-08 NOTE — Progress Notes (Signed)
PHARMACIST - PHYSICIAN COMMUNICATION DR:   Sharyn Lull CONCERNING: Antibiotic IV to Oral Route Change Policy  RECOMMENDATION: This patient is receiving Levaquin by the intravenous route.  Based on criteria approved by the Pharmacy and Therapeutics Committee, the antibiotic(s) is/are being converted to the equivalent oral dose form(s).   DESCRIPTION: These criteria include:  Patient being treated for a respiratory tract infection, urinary tract infection, or cellulitis  The patient is not neutropenic and does not exhibit a GI malabsorption state  The patient is eating (either orally or via tube) and/or has been taking other orally administered medications for a least 24 hours  The patient is improving clinically and has a Tmax < 100.5  If you have questions about this conversion, please contact the Pharmacy Department  []   [phone] )  [name] [x]   [phone] )  [name]  []   [phone] )  [facility] []   [phone] )  [facility]   Thanks, [place] K. Allena Katz, PharmD, BCPS.  Clinical Pharmacist Pager [phone]. 08/08/2012 8:24 AM

## 2012-08-08 NOTE — Progress Notes (Signed)
Clinical Social Work Department BRIEF PSYCHOSOCIAL ASSESSMENT 08/08/2012  Patient:  [name], [name]     Account Number:  [id number]     Admit date:  08/06/2012  Clinical Social Worker:  Margaree Mackintosh  Date/Time:  08/08/2012 01:58 PM  Referred by:  RN  Date Referred:  08/08/2012 Referred for  SNF Placement   Other Referral:   Interview type:  Other - See comment Other interview type:   RN    PSYCHOSOCIAL DATA Living Status:  SIGNIFICANT OTHER Admitted from facility:   Level of care:   Primary support name:  [name]: [phone] Primary support relationship to patient:  FRIEND Degree of support available:   Unknown.    CURRENT CONCERNS Current Concerns  Post-Acute Placement   Other Concerns:    SOCIAL WORK ASSESSMENT / PLAN Clinical Social Worker recieved referral for potential SNF placement at dc.  CSW reviewed chart; pt with several hospital admits (6) and ed visits (24) in the past 6 months. Pt is followed by ACT Team in community. CSW staffed case with RN.  RN reports pt resides with her significant other-RN questions significant other's ability to fully take care of pt as she requires extensive assistnace.  CSW suggested a psych consult given pt's extensive psych history.  CSW staffed case with RNCM.  CSW attempted to meet with pt; pt currently appears asleep and does not awake to her name being called several times.  CSW will attempt to meet with pt again.   Assessment/plan status:  Information/Referral to [facility] Other assessment/ plan:   Information/referral to community resources:   SNF  HH  In house Psych.    PATIENT'S/FAMILY'S RESPONSE TO PLAN OF CARE: Pt was not engaged with intervention.  RN thanked CSW for intervention.        Angelia Mould, MSW, [place] [phone]

## 2012-08-08 NOTE — Progress Notes (Signed)
PULMONARY  / CRITICAL CARE MEDICINE  Name: [name] MRN: [id number] DOB: 02/21/1951    ADMISSION DATE:  16-Aug-2012 CONSULTATION DATE:  08/07/2012  REFERRING MD :  Sharyn Lull  CHIEF COMPLAINT:  Hypercarbic respiratory failure  BRIEF PATIENT DESCRIPTION:  62 yo female former smoker admitted with acute on chronic hypercapnic respiratory failure.  PCCM consulted to assist with COPD, OSA management.  Previously followed by Dr. Craige Cotta as outpt (Last seen in pulmonary office 08/25/10).  Significant PMHx of COPD, Grade 1 diastolic heart failure, Chronic pain, Schizo-affective disorder, HTN, Hypothyroidism GERD, Seizures, Morbid obesity, Hyperlipidemia, DM, A fib/flutter  SIGNIFICANT EVENTS: 2022-08-16  Admitted for acute on chronic hypercarbic respiratory failure.  BiPAP / Narcan gtt started 2/18 transferred to SDU  STUDIES: 01/10/10 PSG >> AHI 14.4 Aug 16, 2012 CT angio chest >> no PE.  ANTIBIOTICS: Levaquin 16-Aug-2022 >> 2/19  SUBJECTIVE:  No distress. No new complaints  VITAL SIGNS: Temp:  [98.4 F (36.9 C)-100 F (37.8 C)] 99.8 F (37.7 C) (02/19 0800) Pulse Rate:  [98-111] 110 (02/19 0800) Resp:  [12-23] 18 (02/19 0800) BP: (96-119)/(48-74) 111/50 mmHg (02/19 0800) SpO2:  [94 %-98 %] 98 % (02/19 0800) Weight:  [150 kg (330 lb 11 oz)] 150 kg (330 lb 11 oz) (02/18 2316)  PHYSICAL EXAMINATION: General:  No distress Neuro:  Alert, follows commands HEENT: No sinus tenderness Cardiovascular:  s1s2 regular, no murmur Lungs:  Decreased breath sounds, no wheeze/rales Abdomen:  Obese, soft Musculoskeletal:  Moves all extremities, 2+ non-pitting edema Skin:  No rashes   Recent Labs Lab 08/01/12 2317 08-16-12 1437 08-16-2012 2205 08/07/12 0121 08/07/12 0216 08/07/12 0740  HGB 13.5 14.1  --   --   --   --   WBC 4.5 4.3  --   --   --   --   PLT 106* 128*  --   --   --   --   NA  --  139  --   --  141  --   K  --  3.6  --   --  3.8  --   CL  --  99  --   --  102  --   CO2  --  31  --   --   30  --   GLUCOSE  --  96  --   --  100*  --   BUN  --  8  --   --  8  --   CREATININE  --  0.67  --   --  0.79  --   CALCIUM  --  9.9  --   --  9.3  --   AST  --  42*  --   --  36  --   ALT  --  22  --   --  21  --   ALKPHOS  --  45  --   --  44  --   BILITOT  --  0.5  --   --  0.3  --   PROT  --  6.3  --   --  6.3  --   ALBUMIN  --  3.0*  --   --  2.8*  --   PHART  --   --  7.222* 7.202*  --  7.256*  PCO2ART  --   --  77.2* 81.5*  --  68.4*  PO2ART  --   --  75.0* 72.0*  --  98.9  HCO3  --   --  31.7* 31.9*  --  29.4*  O2SAT  --   --  91.0 89.0  --  98.9   Imaging: No new CXR  ASSESSMENT / PLAN:  A: Acute on chronic respiratory failure from narcotic use in setting of reported hx of COPD, OSA/OHS. P: Adjust oxygen to keep SpO2 90-94% Auto-CPAP qhs BD's prn D/C abx Please schedule f/u with Dr Craige Cotta after discharge  [facility] Pulm [phone]  A: Chronic pain. Hx of schizo-affective disorder. P: Limit narcotics Resume psych meds per primary team  A: Chronic diastolic CHF. P: Goal negative fluid balance as tolerated  A: Hx of hypothyroidism. P: Continue synthroid per primary team  A: Morbid obesity >> much of her respiratory problems likely related to her weight. P: May need evaluation for bariatric surgery   Appears ready for transfer to med-surg. PCCM will sign off. Please call if we can be of further assistance   Coralyn Helling, MD [facility] 08/08/2012, 12:08 PM Pager:  [phone] After 3pm call: [phone]

## 2012-08-08 NOTE — Progress Notes (Signed)
Subjective:  Patient denies any chest pain or shortness of breath states breathing has improved and coughing has improved more awake today. Discussed with patient regarding skilled nursing facility  Objective:  Vital Signs in the last 24 hours: Temp:  [98 F (36.7 C)-100 F (37.8 C)] 98 F (36.7 C) (02/19 1213) Pulse Rate:  [98-111] 102 (02/19 1213) Resp:  [12-23] 20 (02/19 1213) BP: (96-121)/(48-74) 121/67 mmHg (02/19 1213) SpO2:  [94 %-98 %] 96 % (02/19 1213) Weight:  [150 kg (330 lb 11 oz)] 150 kg (330 lb 11 oz) (02/18 2316)  Intake/Output from previous day: 02/18 0701 - 02/19 0700 In: 692.5 [P.O.:480; I.V.:62.5; IV Piggyback:150] Out: 450 [Urine:450] Intake/Output from this shift:    Physical Exam: Neck: no adenopathy, no carotid bruit, no JVD and supple, symmetrical, trachea midline Lungs: Decreased breath sound at bases Heart: regular rate and rhythm, S1, S2 normal, no murmur, click, rub or gallop Abdomen: soft, non-tender; bowel sounds normal; no masses,  no organomegaly Extremities: extremities normal, atraumatic, no cyanosis or edema  Lab Results:  Recent Labs  08/06/12 1437  WBC 4.3  HGB 14.1  PLT 128*    Recent Labs  08/06/12 1437 08/07/12 0216  NA 139 141  K 3.6 3.8  CL 99 102  CO2 31 30  GLUCOSE 96 100*  BUN 8 8  CREATININE 0.67 0.79   No results found for this basename: TROPONINI, CK, MB,  in the last 72 hours Hepatic Function Panel  Recent Labs  08/07/12 0216  PROT 6.3  ALBUMIN 2.8*  AST 36  ALT 21  ALKPHOS 44  BILITOT 0.3   No results found for this basename: CHOL,  in the last 72 hours No results found for this basename: PROTIME,  in the last 72 hours  Imaging: Imaging results have been reviewed and Dg Chest 2 View  08/06/2012  *RADIOLOGY REPORT*  Clinical Data: Shortness of breath and weakness  CHEST - 2 VIEW  Comparison: 08/01/2012  Findings: Normal heart size.  No pleural effusion or edema.  Lung volumes are low.  There is no  airspace consolidation.  Mild spondylosis identified within the thoracic spine.  IMPRESSION:  1.  No acute cardiopulmonary abnormalities. 2.  Thoracic spondylosis.   Original Report Authenticated By: Signa Kell, M.D.    Ct Angio Chest Pe W/cm &/or Wo Cm  08/06/2012  *RADIOLOGY REPORT*  Clinical Data: Shortness of breath.  Rule out pulmonary embolus. Hemoptysis.  CT ANGIOGRAPHY CHEST  Technique:  Multidetector CT imaging of the chest using the standard protocol during bolus administration of intravenous contrast. Multiplanar reconstructed images including MIPs were obtained and reviewed to evaluate the vascular anatomy.  Contrast: OMNIPAQUE IOHEXOL 350 MG/ML SOLN  Comparison: Chest x-ray from the same day.  Findings: Pulmonary arterial opacification is satisfactory.  There is significant streak artifact secondary patient body habitus.  No focal filling defects are evident to suggest pulmonary embolus. The heart is enlarged.  No significant pleural or pericardial effusion is present.  No significant mediastinal or axillary adenopathy is present. Limited imaging of the upper abdomen is unremarkable.  The mild dependent atelectasis is present bilaterally.  There is some ground-glass attenuation suggesting edema.  The bone windows demonstrate mild endplate degenerative change.  IMPRESSION:  1.  No evidence of pulmonary embolus. 2.  Cardiomegaly and mild edema. 3.  Mild dependent atelectasis bilaterally.   Original Report Authenticated By: Marin Roberts, M.D.     Cardiac Studies:  Assessment/Plan:  Status post Acute on  chronic hypercarbic respiratory failure worsened by narcotics  Resolving Exacerbation of COPD  Status post Hypotension probably secondary to medications  History of hypertension  History of paroxysmal atrial flutter  Morbid obesity  Obesity hypoventilation syndrome/obstructive sleep apnea  Hypothyroidism  History of tobacco abuse  Had this hernia  History of seizure  disorder  History of schizoaffective disorder  Plan Continue present management Out of bed to chair Social service for discharge planning for possible skilled nursing facility  LOS: 2 days    Flynn Gwyn N 08/08/2012, 12:39 PM

## 2012-08-09 NOTE — Progress Notes (Signed)
Subjective:  Patient denies any chest pain or shortness of breath. States willing to go to short-term rehabilitation/skilled nursing facility  Objective:  Vital Signs in the last 24 hours: Temp:  [98 F (36.7 C)-98.4 F (36.9 C)] 98.2 F (36.8 C) (02/20 0741) Pulse Rate:  [93-104] 102 (02/20 0741) Resp:  [12-21] 13 (02/20 0741) BP: (109-128)/(49-78) 109/60 mmHg (02/20 0741) SpO2:  [89 %-100 %] 98 % (02/20 0741) Weight:  [155.5 kg (342 lb 13 oz)] 155.5 kg (342 lb 13 oz) (02/19 2332)  Intake/Output from previous day: 02/19 0701 - 02/20 0700 In: 1920 [P.O.:1920] Out: -  Intake/Output from this shift: Total I/O In: 480 [P.O.:480] Out: -   Physical Exam: Neck: no adenopathy, no carotid bruit, no JVD and supple, symmetrical, trachea midline Lungs: clear to auscultation bilaterally Heart: regular rate and rhythm, S1, S2 normal, no murmur, click, rub or gallop Abdomen: soft, non-tender; bowel sounds normal; no masses,  no organomegaly Extremities: extremities normal, atraumatic, no cyanosis or edema  Lab Results:  Recent Labs  08/06/12 1437  WBC 4.3  HGB 14.1  PLT 128*    Recent Labs  08/06/12 1437 08/07/12 0216  NA 139 141  K 3.6 3.8  CL 99 102  CO2 31 30  GLUCOSE 96 100*  BUN 8 8  CREATININE 0.67 0.79   No results found for this basename: TROPONINI, CK, MB,  in the last 72 hours Hepatic Function Panel  Recent Labs  08/07/12 0216  PROT 6.3  ALBUMIN 2.8*  AST 36  ALT 21  ALKPHOS 44  BILITOT 0.3   No results found for this basename: CHOL,  in the last 72 hours No results found for this basename: PROTIME,  in the last 72 hours  Imaging: Imaging results have been reviewed and No results found.  Cardiac Studies:  Assessment/Plan:  Status post Acute on chronic hypercarbic respiratory failure worsened by narcotics  Resolving Exacerbation of COPD  Status post Hypotension probably secondary to medications  History of hypertension  History of paroxysmal  atrial flutter  Morbid obesity  Obesity hypoventilation syndrome/obstructive sleep apnea  Hypothyroidism  History of tobacco abuse  Had this hernia  History of seizure disorder  History of schizoaffective disorder  Plan  Continue present management  Out of bed to chair  Social service for discharge planning for possible skilled nursing facility OT PT consult if not done yet Transfer to telemetry  LOS: 3 days    Kennede Lusk N 08/09/2012, 10:44 AM

## 2012-08-09 NOTE — Care Management Note (Signed)
    Page 1 of 2   08/14/2012     2:15:07 PM   CARE MANAGEMENT NOTE 08/14/2012  Patient:  [name], [name]   Account Number:  [id number]  Date Initiated:  08/09/2012  Documentation initiated by:  Verdis Prime  Subjective/Objective Assessment:   62 yr-old female adm with dx of COPD exac; lives with fiance, active with [facility]     Action/Plan:   PT WILL NEED SNF AT DC; WILL CONSULT CSW TO FACILITATE DC TO SNF WHEN MEDICALLY STABLE FOR DC.   Anticipated DC Date:  08/12/2012   Anticipated DC Plan:  SKILLED NURSING FACILITY  In-house referral  Clinical Social Worker      DC Planning Services  CM consult      Choice offered to / List presented to:          [facility] arranged  HH-6 SOCIAL WORKER      Status of service:  Completed, signed off Medicare Important Message given?   (If response is "NO", the following Medicare IM given date fields will be blank) Date Medicare IM given:   Date Additional Medicare IM given:    Discharge Disposition:  SKILLED NURSING FACILITY  Per UR Regulation:  Reviewed for med. necessity/level of care/duration of stay  If discussed at Long Length of Stay Meetings, dates discussed:   08/14/2012    Comments:  08/14/12 Braden Cimo,RN,BSN [phone] PT DISCHARGED TO SNF TODAY, PER CSW ARRANGEMENTS.  08/09/12 Lyndon Chapel,RN,BSN [phone] PER CSW, PT NOW AGREEABLE TO SNF.  CSW TO FOLLOW AND FACILITATE DC TO SNF WHEN MEDICALLY STABLE.  08/09/12 1008 Henrietta Mayo RN BSN MSN CCM Per CSW, pt refuses SNF placement, will return home with home health that was arranged upon her discharge on 08/01/12.  [facility] liaison aware of pt's admission.
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
## 2012-08-09 NOTE — Progress Notes (Signed)
Clinical Social Worker met with pt at bedside.  CSW introduced self, explained role, and provided support.  CSW provided active listening.  Pt shared that she resides with her significant other-of about 18 years.  Pt shared "that doctor wants me to go to a facility, but I don't want to go".  Pt continued to share that she wishes to dc home with home health through "[facility]" or "[facility]".  Pt would like to be transferred to the fifth floor, "because I haven't seen the 5th floor yet".  Pt would also like a "[employer]" at home.  CSW to sign off at this time.  CSW updated RNCM.  Please reconsult if needed.   Angelia Mould, MSW, [place] [phone]

## 2012-08-09 NOTE — Progress Notes (Signed)
Pt states she still feels burning with urination. Will notify MDs of this and on coming night shift nurse.

## 2012-08-09 NOTE — Progress Notes (Signed)
Clinical Social Worker submitted 30 day note to [place] [facility].   Angelia Mould, MSW, [place] [phone]

## 2012-08-09 NOTE — Progress Notes (Signed)
Clinical Social Worker faxed 30 day note-for Passr Review-to MD for his review.   Angelia Mould, MSW, [place] [phone]

## 2012-08-09 NOTE — Progress Notes (Signed)
Physical Therapy Evaluation Patient Details Name: [name] MRN: [id number] DOB: [birth year] Today's Date: 08/09/2012 Time: 1205-1230 PT Time Calculation (min): 25 min  PT Assessment / Plan / Recommendation Clinical Impression  Pt presents with obesity hypoventilation syndrome, hypoxia, AMS and history of schizoaffective d/o and COPD.  2 person assist for pericare and linen change with pt with varied history. Pt stating she could get to the bathroom during the day and bath herself at the toilet but unable to even begin to assist with pericare or linen change today. Pt will benefit from acute therapy to maximize mobility and activity tolerance to decrease burden of care prior to discharge. Pt agreeable to SNF at this time.     PT Assessment  Patient needs continued PT services    Follow Up Recommendations  SNF;Supervision/Assistance - 24 hour    Does the patient have the potential to tolerate intense rehabilitation      Barriers to Discharge Decreased caregiver support      Equipment Recommendations  Hospital bed;Wheelchair (measurements PT);Wheelchair cushion (measurements PT)    Recommendations for Other Services     Frequency Min 3X/week    Precautions / Restrictions Precautions Precautions: Fall Precaution Comments: obesity Restrictions Weight Bearing Restrictions: No   Pertinent Vitals/Pain No pain       Mobility  Bed Mobility Bed Mobility: Rolling Right;Rolling Left;Scooting to HOB Rolling Right: 3: Mod assist;With rail Rolling Left: 3: Mod assist;With rail Supine to Sit: Not tested (comment) Scooting to HOB: 1: +2 Total assist;With rail;Other (comment) (bed in trendelenburg) Scooting to [facility]: Patient Percentage: 70% Details for Bed Mobility Assistance: cueing and assist to reach for rails to roll with increased difficulty left vs right. Pt rolled x 2 each direction for bedpan and pericare which was total assist. Pt unable to maintain sidelying fully without  mod assist to maintain hip rotation. Did not attempt sitting due to pt limited strength today.  Transfers Transfers: Not assessed Ambulation/Gait Ambulation/Gait Assistance: Not tested (comment)    Exercises     PT Diagnosis: Generalized weakness;Altered mental status  PT Problem List: Decreased strength;Decreased range of motion;Decreased activity tolerance;Decreased balance;Decreased mobility;Decreased cognition;Obesity PT Treatment Interventions: Functional mobility training;Therapeutic activities;Therapeutic exercise;Patient/family education   PT Goals Acute Rehab PT Goals PT Goal Formulation: With patient Time For Goal Achievement: 08/23/12 Potential to Achieve Goals: Fair Pt will Roll Supine to Right Side: with min assist PT Goal: Rolling Supine to Right Side - Progress: Goal set today Pt will Roll Supine to Left Side: with min assist PT Goal: Rolling Supine to Left Side - Progress: Goal set today Pt will go Supine/Side to Sit: with max assist PT Goal: Supine/Side to Sit - Progress: Goal set today Pt will go Sit to Supine/Side: with max assist PT Goal: Sit to Supine/Side - Progress: Goal set today Pt will go Sit to Stand: with +2 total assist (pt=60%) PT Goal: Sit to Stand - Progress: Goal set today Pt will go Stand to Sit: with +2 total assist (pt=60%) PT Goal: Stand to Sit - Progress: Goal set today Pt will Transfer Bed to Chair/Chair to Bed: with +2 total assist (pt=60%) PT Transfer Goal: Bed to Chair/Chair to Bed - Progress: Goal set today PT Goal: Ambulate - Progress: Discontinued (comment)  Visit Information  Last PT Received On: 08/09/12 Assistance Needed: +2    Subjective Data  Subjective: My boyfriend lives with me and helps me Patient Stated Goal: pt stating she is agreeable to ST-SNF   Prior Functioning  Home Living Lives With: Significant other Available Help at Discharge: Family;Available PRN/intermittently Type of Home: Apartment Home Access:  Elevator Home Layout: One level Bathroom Shower/Tub: Engineer, manufacturing systems: Standard Home Adaptive Equipment: Walker - rolling Prior Function Level of Independence: Needs assistance Needs Assistance: Bathing;Dressing;Toileting;Light Housekeeping;Meal Prep Bath: Moderate Dressing: Moderate Toileting: Maximal Meal Prep: Total Light Housekeeping: Total Comments: Pt states she needed help to use bedpan at home but would walk to kitchen with walker. pt states she has had at least 12 falls in the last year. pt states she can't walk to bathroom Communication Communication: No difficulties Dominant Hand: Right    Cognition  Cognition Overall Cognitive Status: Impaired Area of Impairment: Memory;Safety/judgement Arousal/Alertness: Awake/alert Orientation Level: Appears intact for tasks assessed Memory Deficits: difficulty accurately and consistently providing PLOF Following Commands: Follows one step commands consistently Safety/Judgement: Decreased awareness of need for assistance    Extremity/Trunk Assessment Right Upper Extremity Assessment RUE ROM/Strength/Tone: [facility] for tasks assessed Left Upper Extremity Assessment LUE ROM/Strength/Tone: WFL for tasks assessed LUE ROM/Strength/Tone Deficits: grossly WFL ROMable to lift arm to [facility]  and reach for rails not formally assessed for strength Right Lower Extremity Assessment RLE ROM/Strength/Tone: Deficits RLE ROM/Strength/Tone Deficits: pt grossly 2/5 with limited ROM due to body habitus, needs assist to fully bend knee in bed and unable to perform short arc quad, unable to perform hip Abdct/Add due to body habitus Left Lower Extremity Assessment LLE ROM/Strength/Tone Deficits: pt grossly 2/5 with limited ROM due to body habitus, needs assist to fully bend knee in bed and unable to perform short arc quad, unable to perform hip Abdct/Add due to body habitus   Balance    End of Session PT - End of Session Equipment Utilized  During Treatment: Oxygen Activity Tolerance: Patient limited by fatigue Patient left: in bed;with call bell/phone within reach Nurse Communication: Mobility status;Need for lift equipment  GP     [name] 08/09/2012, 12:52 PM [name], PT [phone]

## 2012-08-09 NOTE — Progress Notes (Signed)
Clinical Social Worker spoke with pt. Pt is now interested in SNF, preferably [facility] if available.  CSW to continue to follow and assist as needed.   Angelia Mould, MSW, [place] [phone]

## 2012-08-10 LAB — URINALYSIS, ROUTINE W REFLEX MICROSCOPIC
Bilirubin Urine: NEGATIVE
Glucose, UA: NEGATIVE mg/dL
Hgb urine dipstick: NEGATIVE
Ketones, ur: NEGATIVE mg/dL
Protein, ur: NEGATIVE mg/dL
Urobilinogen, UA: 1 mg/dL (ref 0.0–1.0)

## 2012-08-10 MED ORDER — CIPROFLOXACIN HCL 500 MG PO TABS
500.0000 mg | ORAL_TABLET | Freq: Two times a day (BID) | ORAL | Status: DC
  Administered 2012-08-10 – 2012-08-14 (×9): 500 mg via ORAL
  Filled 2012-08-10 (×12): qty 1

## 2012-08-10 NOTE — Progress Notes (Signed)
Subjective:  Patient complains of burning in urination. Denies any fever chills. Denies any chest pain or shortness of breath  Objective:  Vital Signs in the last 24 hours: Temp:  [97.7 F (36.5 C)-98.4 F (36.9 C)] 98.4 F (36.9 C) (02/21 0323) Pulse Rate:  [87-99] 90 (02/21 0323) Resp:  [17-31] 18 (02/21 0323) BP: (109-141)/(57-93) 109/57 mmHg (02/21 0900) SpO2:  [93 %-98 %] 95 % (02/21 0900)  Intake/Output from previous day: 02/20 0701 - 02/21 0700 In: 720 [P.O.:720] Out: -  Intake/Output from this shift: Total I/O In: -  Out: 200 [Urine:200]  Physical Exam: Neck: no adenopathy, no carotid bruit, no JVD and supple, symmetrical, trachea midline Lungs: clear to auscultation bilaterally Heart: regular rate and rhythm, S1, S2 normal, no murmur, click, rub or gallop Abdomen: soft, non-tender; bowel sounds normal; no masses,  no organomegaly Extremities: extremities normal, atraumatic, no cyanosis or edema  Lab Results: No results found for this basename: WBC, HGB, PLT,  in the last 72 hours No results found for this basename: NA, K, CL, CO2, GLUCOSE, BUN, CREATININE,  in the last 72 hours No results found for this basename: TROPONINI, CK, MB,  in the last 72 hours Hepatic Function Panel No results found for this basename: PROT, ALBUMIN, AST, ALT, ALKPHOS, BILITOT, BILIDIR, IBILI,  in the last 72 hours No results found for this basename: CHOL,  in the last 72 hours No results found for this basename: PROTIME,  in the last 72 hours  Imaging: Imaging results have been reviewed and No results found.  Cardiac Studies:  Assessment/Plan:  Rule out UTI Status post Acute on chronic hypercarbic respiratory failure worsened by narcotics  Resolving Exacerbation of COPD  Status post Hypotension probably secondary to medications  History of hypertension  History of paroxysmal atrial flutter  Morbid obesity  Obesity hypoventilation syndrome/obstructive sleep apnea  Hypothyroidism   History of tobacco abuse  Had this hernia  History of seizure disorder  History of schizoaffective disorder  Plan Check UA and urine culture Start Cipro by mouth as per orders Awaiting skilled nursing facility  LOS: 4 days    [name] N 08/10/2012, 12:00 PM

## 2012-08-11 LAB — PROCALCITONIN: Procalcitonin: <0.1 ng/mL

## 2012-08-11 NOTE — Progress Notes (Signed)
Subjective:  Patient denies any chest pain or shortness of breath. States urinary burning has improved Requesting for private room at skilled nursing facility  Objective:  Vital Signs in the last 24 hours: Temp:  [97.8 F (36.6 C)-98.4 F (36.9 C)] 97.8 F (36.6 C) (02/22 0640) Pulse Rate:  [88-98] 88 (02/22 0640) Resp:  [16-20] 16 (02/22 0640) BP: (120-142)/(62-95) 130/75 mmHg (02/22 0640) SpO2:  [95 %-99 %] 99 % (02/22 0640)  Intake/Output from previous day: 02/21 0701 - 02/22 0700 In: 720 [P.O.:720] Out: 1200 [Urine:1200] Intake/Output from this shift: Total I/O In: -  Out: 300 [Urine:300]  Physical Exam: Exam unchanged  Lab Results: No results found for this basename: WBC, HGB, PLT,  in the last 72 hours No results found for this basename: NA, K, CL, CO2, GLUCOSE, BUN, CREATININE,  in the last 72 hours No results found for this basename: TROPONINI, CK, MB,  in the last 72 hours Hepatic Function Panel No results found for this basename: PROT, ALBUMIN, AST, ALT, ALKPHOS, BILITOT, BILIDIR, IBILI,  in the last 72 hours No results found for this basename: CHOL,  in the last 72 hours No results found for this basename: PROTIME,  in the last 72 hours  Imaging: Imaging results have been reviewed and No results found.  Cardiac Studies:  Assessment/Plan:  Resolving UTI Status post Acute on chronic hypercarbic respiratory failure worsened by narcotics  Resolving Exacerbation of COPD  Status post Hypotension probably secondary to medications  History of hypertension  History of paroxysmal atrial flutter  Morbid obesity  Obesity hypoventilation syndrome/obstructive sleep apnea  Hypothyroidism  History of tobacco abuse  Had this hernia  History of seizure disorder  History of schizoaffective disorder  Plan Continue present management Check urine cultures Awaiting skilled nursing facility   LOS: 5 days    Hildegard Hlavac N 08/11/2012, 12:17 PM

## 2012-08-12 NOTE — Progress Notes (Signed)
Subjective: Patient denies any chest pain or shortness of breath. No further urinary complaints Objective:  Vital Signs in the last 24 hours: Temp:  [97.7 F (36.5 C)-98.9 F (37.2 C)] 98.9 F (37.2 C) (02/23 0422) Pulse Rate:  [92-98] 98 (02/23 0422) Resp:  [16-18] 18 (02/23 0422) BP: (121-139)/(73-86) 139/78 mmHg (02/23 0422) SpO2:  [93 %-98 %] 93 % (02/23 0422)  Intake/Output from previous day: 02/22 0701 - 02/23 0700 In: 240 [P.O.:240] Out: 951 [Urine:950; Stool:1] Intake/Output from this shift:    Physical Exam: Neck: no adenopathy, no carotid bruit, no JVD and supple, symmetrical, trachea midline Lungs: Decrease breath sounds at bases Heart: regular rate and rhythm, S1, S2 normal, no murmur, click, rub or gallop Abdomen: soft, non-tender; bowel sounds normal; no masses,  no organomegaly Extremities: extremities normal, atraumatic, no cyanosis or edema  Lab Results: No results found for this basename: WBC, HGB, PLT,  in the last 72 hours No results found for this basename: NA, K, CL, CO2, GLUCOSE, BUN, CREATININE,  in the last 72 hours No results found for this basename: TROPONINI, CK, MB,  in the last 72 hours Hepatic Function Panel No results found for this basename: PROT, ALBUMIN, AST, ALT, ALKPHOS, BILITOT, BILIDIR, IBILI,  in the last 72 hours No results found for this basename: CHOL,  in the last 72 hours No results found for this basename: PROTIME,  in the last 72 hours  Imaging: Imaging results have been reviewed and No results found.  Cardiac Studies:  Assessment/Plan:  Resolving UTI  Status post Acute on chronic hypercarbic respiratory failure worsened by narcotics  Resolving Exacerbation of COPD  Status post Hypotension probably secondary to medications  History of hypertension  History of paroxysmal atrial flutter  Morbid obesity  Obesity hypoventilation syndrome/obstructive sleep apnea  Hypothyroidism  History of tobacco abuse  Had this hernia   History of seizure disorder  History of schizoaffective disorder  Plan  continue present management Awaiting skilled nursing facility  LOS: 6 days    Rajveer Handler N 08/12/2012, 11:21 AM

## 2012-08-13 LAB — URINE CULTURE: Colony Count: >=100000

## 2012-08-13 MED ORDER — ENSURE COMPLETE PO LIQD
237.0000 mL | Freq: Two times a day (BID) | ORAL | Status: DC
  Administered 2012-08-14: 237 mL via ORAL

## 2012-08-13 NOTE — Progress Notes (Signed)
Arthur Holms, RD, LDN Pager #: [phone] After-Hours Pager #: [phone]

## 2012-08-13 NOTE — Progress Notes (Signed)
Subjective:   Patient denies any chest pain or shortness of breath. Awaiting skilled nursing facility  Objective:  Vital Signs in the last 24 hours: Temp:  [97.9 F (36.6 C)-98.6 F (37 C)] 98.6 F (37 C) (02/24 0500) Pulse Rate:  [86-95] 86 (02/24 0500) Resp:  [16-18] 18 (02/24 0500) BP: (134-146)/(82-89) 134/82 mmHg (02/24 0500) SpO2:  [93 %-96 %] 96 % (02/24 0500)  Intake/Output from previous day: 02/23 0701 - 02/24 0700 In: 480 [P.O.:480] Out: 550 [Urine:550] Intake/Output from this shift:    Physical Exam: Neck: no adenopathy, no carotid bruit, no JVD and supple, symmetrical, trachea midline Lungs: clear to auscultation bilaterally Heart: regular rate and rhythm, S1, S2 normal, no murmur, click, rub or gallop Abdomen: soft, non-tender; bowel sounds normal; no masses,  no organomegaly Extremities: extremities normal, atraumatic, no cyanosis or edema  Lab Results: No results found for this basename: WBC, HGB, PLT,  in the last 72 hours No results found for this basename: NA, K, CL, CO2, GLUCOSE, BUN, CREATININE,  in the last 72 hours No results found for this basename: TROPONINI, CK, MB,  in the last 72 hours Hepatic Function Panel No results found for this basename: PROT, ALBUMIN, AST, ALT, ALKPHOS, BILITOT, BILIDIR, IBILI,  in the last 72 hours No results found for this basename: CHOL,  in the last 72 hours No results found for this basename: PROTIME,  in the last 72 hours  Imaging: Imaging results have been reviewed and No results found.  Cardiac Studies:  Assessment/Plan:  Resolving UTI  Status post Acute on chronic hypercarbic respiratory failure worsened by narcotics  Resolving Exacerbation of COPD  Status post Hypotension probably secondary to medications  History of hypertension  History of paroxysmal atrial flutter  Morbid obesity  Obesity hypoventilation syndrome/obstructive sleep apnea  Hypothyroidism  History of tobacco abuse  Had this hernia   History of seizure disorder  History of schizoaffective disorder  Plan Continue present management Increase ambulation Awaiting skilled nursing facility  LOS: 7 days    [name] N 08/13/2012, 11:16 AM

## 2012-08-13 NOTE — Progress Notes (Signed)
INITIAL NUTRITION ASSESSMENT  DOCUMENTATION CODES Per approved criteria  -Morbid Obesity   INTERVENTION: 1. Ensure complete. Each supplement provide 350 kcal and 13 grams of protein.  2. RD will continue to monitor.   NUTRITION DIAGNOSIS: Inadequate oral intake related to decreased appetite as evidenced by 50% meal completion.   Goal: Pt to meet >/= 50% of estimated needs   Monitor:  PO intake, weight   Reason for Assessment: Low Braden (=12)  62 y.o. female  Admitting Dx: <principal problem not specified>  ASSESSMENT: Pt arrived to ED from home complaining of SOB. Home health nurse noted low oxygen saturation that did not improve with nebulizer treatment. Per H&P, pt is well-known to facility, has a PMH os schizoaffective disorder and is a poor historian. Pt diagnosed with cute on chronic hypercarbic respiratory failure, likely exacerbated by opioids. Pt has extensive PMH.  Of note, pt has documented abrasions and is likely at risk for skin breakdown.   RD spoke with pt about intake while admitted. Pt reports she has experienced decreased appetite since admission.  RD present when pt received lunch tray.  Pt shaking visibly and had some difficulties answering questions. Pt stated her decreased appetite was d/t the fact she was worried but she was unable to say why she was worried. Pt stated she did not think she was eating enough to meet her nutrition and requested a nutrition supplement.    Height: Ht Readings from Last 1 Encounters:  08/07/12 5\' 2"  (1.575 m)    Weight: Wt Readings from Last 1 Encounters:  08/08/12 342 lb 13 oz (155.5 kg)    Ideal Body Weight: 110 lbs   % Ideal Body Weight: 310%  Wt Readings from Last 10 Encounters:  08/08/12 342 lb 13 oz (155.5 kg)  08/01/12 328 lb 7.8 oz (149 kg)  06/03/12 342 lb 6.4 oz (155.312 kg)  05/25/12 350 lb 14.4 oz (159.167 kg)  03/29/12 341 lb 11.4 oz (155 kg)  01/27/12 360 lb (163.295 kg)  01/19/12 360 lb (163.295  kg)  01/12/12 332 lb 10.8 oz (150.9 kg)  12/28/11 345 lb 3.9 oz (156.6 kg)  11/22/11 324 lb (146.965 kg)    Usual Body Weight: many weight fluctuations via medical history    BMI:  Body mass index is 62.69 kg/(m^2). - Morbid obesity   Estimated Nutritional Needs: Kcal: 2100-2300 Protein: 75-91 g  Fluid: 2.1 - 2.3 L  Skin: abrasion to left hip, yeast like rash under bilateral breast, abrasion to right buttocks per RN flowsheet   Diet Order: Cardiac  EDUCATION NEEDS: -No education needs identified at this time   Intake/Output Summary (Last 24 hours) at 08/13/12 1016 Last data filed at 08/12/12 1700  Gross per 24 hour  Intake    360 ml  Output    200 ml  Net    160 ml    Last BM: 08/11/2012   Labs:   Recent Labs Lab 08/06/12 1437 08/07/12 0216  NA 139 141  K 3.6 3.8  CL 99 102  CO2 31 30  BUN 8 8  CREATININE 0.67 0.79  CALCIUM 9.9 9.3  GLUCOSE 96 100*    CBG (last 3)  No results found for this basename: GLUCAP,  in the last 72 hours  Scheduled Meds: . aspirin EC  81 mg Oral q morning - 10a  . ciprofloxacin  500 mg Oral BID  . divalproex  1,000 mg Oral BID  . enoxaparin (LOVENOX) injection  40 mg Subcutaneous  Q24H  . folic acid  1 mg Oral q morning - 10a  . levothyroxine  150 mcg Oral QAC breakfast  . multivitamin with minerals  1 tablet Oral q morning - 10a  . pantoprazole  40 mg Oral Daily  . polyethylene glycol  17 g Oral Q3 days    Continuous Infusions:   Past Medical History  Diagnosis Date  . Schizoaffective disorder   . Urinary tract infection   . Hypertension   . GERD (gastroesophageal reflux disease)   . Hypothyroidism   . Obesity   . Asthma   . COPD (chronic obstructive pulmonary disease)   . CHF (congestive heart failure)   . Seizures   . Morbid obesity   . Chronic pain   . Hypercholesteremia   . Anginal pain   . Myocardial infarction 2002  . Pneumonia     "several times" (05/23/2012)  . Diabetes mellitus     "I'm not  diabetic anymore" (05/23/2012)  . Anemia   . History of blood transfusion 1993    "when I had my hysterectomy" (05/23/2012)  . H/O hiatal hernia   . Daily headache   . Arthritis     "severe; all over my body" (05/23/2012)  . Hypoventilation syndrome     [name] 05/30/2012  . Atrial fibrillation   . Atrial flutter, paroxysmal     [name] 05/30/2012  . Exertional dyspnea   . Shortness of breath     "all the time lately" (05/30/2012)    Past Surgical History  Procedure Laterality Date  . Vaginal hysterectomy  1993  . Tubal ligation  1998  . Cholecystectomy  ?2008  . Cardiac catheterization  2008    [facility]  Dietetic Intern Pager: [phone]

## 2012-08-13 NOTE — Progress Notes (Signed)
Physical Therapy Treatment Patient Details Name: [name] MRN: [id number] DOB: 1951-06-04 Today's Date: 08/13/2012 Time: 1207-1221 PT Time Calculation (min): 14 min  PT Assessment / Plan / Recommendation Comments on Treatment Session  Pt admit with respiratory failure.  Pt able to sit EOB today but could not progress mobility OOB.  She demonstrates decr safety as well as has some weakness.  Continue to recommend SNF.      Follow Up Recommendations  SNF;Supervision/Assistance - 24 hour                 Equipment Recommendations  Hospital bed;Wheelchair (measurements PT);Wheelchair cushion (measurements PT)        Frequency Min 3X/week   Plan Discharge plan remains appropriate;Frequency remains appropriate    Precautions / Restrictions Precautions Precautions: Fall Restrictions Weight Bearing Restrictions: No   Pertinent Vitals/Pain VSS, No pain    Mobility  Bed Mobility Bed Mobility: Rolling Left;Left Sidelying to Sit;Sitting - Scoot to [place] of Bed Rolling Left: 1: +2 Total assist;With rail Rolling Left: Patient Percentage: 50% Left Sidelying to Sit: 1: +2 Total assist;With rails;HOB elevated Left Sidelying to Sit: Patient Percentage: 50% Supine to Sit: Not tested (comment) Sitting - Scoot to Edge of Bed: 1: +2 Total assist Sitting - Scoot to Edge of Bed: Patient Percentage: 50% Scooting to HOB: 1: +2 Total assist Scooting to [facility]: Patient Percentage: 40% Details for Bed Mobility Assistance: cuing and assist to reach for rails to roll.  Pt needed cues and assist to sequence movement to get to EOB from supine position.  Needed assist for LES as well as for elevation of trunk.  Pt needed incr time and assist to get feet onto floor. Transfers Transfers: Sit to Stand;Stand to Sit Sit to Stand: 1: +2 Total assist;With upper extremity assist;From bed Sit to Stand: Patient Percentage: 40% Stand to Sit: 1: +2 Total assist;With upper extremity assist;To bed Stand Pivot  Transfers: Not tested (comment) Details for Transfer Assistance: Pt needed assist to rise with inability to achieve full upright stance even with cues for hand placement and use of pad for hip and knee extension.  Did not feel it safe to transfer pt to chair.  Pt stated she did not feel she could and she could not maintain standing longer than 8 seconds.   Ambulation/Gait Ambulation/Gait Assistance: Not tested (comment) Stairs: No Wheelchair Mobility Wheelchair Mobility: No    Exercises General Exercises - Lower Extremity Ankle Circles/Pumps: AROM;5 reps;Supine;Both Heel Slides: AROM;Both;5 reps;Supine   PT Goals Acute Rehab PT Goals PT Goal: Rolling Supine to Left Side - Progress: Progressing toward goal PT Goal: Supine/Side to Sit - Progress: Progressing toward goal PT Goal: Sit to Supine/Side - Progress: Progressing toward goal PT Goal: Sit to Stand - Progress: Progressing toward goal PT Goal: Stand to Sit - Progress: Progressing toward goal  Visit Information  Last PT Received On: 08/13/12 Assistance Needed: +2    Subjective Data  Subjective: "I can't stand anymore."  After two attempts.   Cognition  Cognition Overall Cognitive Status: Impaired Area of Impairment: Memory;Safety/judgement Arousal/Alertness: Awake/alert Orientation Level: Appears intact for tasks assessed Following Commands: Follows one step commands consistently Safety/Judgement: Decreased awareness of need for assistance    Balance  Static Sitting Balance Static Sitting - Balance Support: No upper extremity supported;Feet supported Static Sitting - Level of Assistance: 5: Stand by assistance Static Sitting - Comment/# of Minutes: up to 6 minutes  End of Session PT - End of Session Equipment Utilized During  Treatment: Gait belt Activity Tolerance: Patient limited by fatigue Patient left: in bed;with call bell/phone within reach Nurse Communication: Mobility status;Need for lift equipment         INGOLD,Arlan Birks 08/13/2012, 2:12 PM [facility] Acute [facility] [phone] (pager)

## 2012-08-13 NOTE — Clinical Social Work Note (Signed)
CSW followed up with the SNF Pt selected for dc and there is no female bed available at this time. They have offered Pt a private room at their sister facility, which Pt is familiar with, and Pt wants to talk to her fiance to make sure he will be able to take the but to visit before she accepts.  CSW will f/u with Pt and fiance but reported to Pt that she could take this bed and they will move her to the other SNF once a bed opens later this week.    CSW will notify MD once Pt is agreeable to dc plan.  FL2 in paper chart for MD signature.   Frederico Hamman, LCSW [phone]

## 2012-08-14 MED ORDER — NITROFURANTOIN MONOHYD MACRO 100 MG PO CAPS: 100.0000 mg | ORAL_CAPSULE | Freq: Two times a day (BID) | ORAL | Status: DC

## 2012-08-14 MED ORDER — NITROFURANTOIN MONOHYD MACRO 100 MG PO CAPS
100.0000 mg | ORAL_CAPSULE | Freq: Two times a day (BID) | ORAL | Status: DC
  Filled 2012-08-14 (×2): qty 1

## 2012-08-14 NOTE — Clinical Social Work Placement (Signed)
Clinical Social Work Department CLINICAL SOCIAL WORK PLACEMENT NOTE 08/14/2012  Patient:  [name], [name]  Account Number:  [id number] Admit date:  08/06/2012  Clinical Social Worker:  Frederico Hamman, LCSW  Date/time:  08/14/2012 12:00 N  Clinical Social Work is seeking post-discharge placement for this patient at the following level of care:   SKILLED NURSING   (*CSW will update this form in Epic as items are completed)   08/09/2012  Patient/family provided with [facility] Department of Clinical Social Work's list of facilities offering this level of care within the geographic area requested by the patient (or if unable, by the patient's family).  08/09/2012  Patient/family informed of their freedom to choose among providers that offer the needed level of care, that participate in Medicare, Medicaid or managed care program needed by the patient, have an available bed and are willing to accept the patient.  08/09/2012  Patient/family informed of [facility]' ownership interest in [facility], as well as of the fact that they are under no obligation to receive care at this facility.  PASARR submitted to EDS on 08/09/2012 PASARR number received from EDS on 08/10/2012  FL2 transmitted to all facilities in geographic area requested by pt/family on  08/09/2012 FL2 transmitted to all facilities within larger geographic area on   Patient informed that his/her managed care company has contracts with or will negotiate with  certain facilities, including the following:     Patient/family informed of bed offers received:  08/09/2012 Patient chooses bed at [facility] [place] Physician recommends and patient chooses bed at    Patient to be transferred to [facility] [place] on  08/14/2012 Patient to be transferred to facility by ptar  The following physician request were entered in Epic:   Additional Comments: Pt will be dc to [facility] and  transfered to [facility] at the end of the week when a bed becomes available. Pt's partner [name] at bedside, will plan to meet Pt at SNF later today.  Frederico Hamman, LCSW [phone]

## 2012-08-14 NOTE — Discharge Summary (Signed)
  RD discharge summary dictated on 08/14/2012 dictation number is [phone]

## 2012-08-14 NOTE — Discharge Summary (Signed)
[name], [name]               ACCOUNT NO.:  [id number]  MEDICAL RECORD NO.:  [id number]  LOCATION:  2037                         FACILITY:  [facility]  PHYSICIAN:  Eduardo Osier. Sharyn Lull, M.D. DATE OF BIRTH:  [birth year]  DATE OF ADMISSION:  08/06/2012 DATE OF DISCHARGE:  08/14/2012                              DISCHARGE SUMMARY   ADMITTING DIAGNOSES: 1. Exacerbation of chronic obstructive pulmonary disease, rule out     pneumonia. 2. Hypotension probably secondary to medication. 3. History of hypertension. 4. History of paroxysmal atrial flutter in the past. 5. Morbid obesity. 6. Obesity hypoventilation syndrome/obstructive sleep apnea. 7. Hypothyroidism. 8. History of tobacco abuse. 9. Hiatus hernia. 10.History of seizure disorder. 11.History of schizoaffective disorder.  FINAL DIAGNOSES: 1. Status post acute on chronic hypercarbic respiratory failure was     done by narcotics. 2. Status post exacerbation of chronic obstructive pulmonary disease. 3. Status post hypotension secondary to medication. 4. History of hypertension. 5. History of paroxysmal atrial flutter. 6. Morbid obesity. 7. Obesity hypoventilation syndrome/obstructive sleep apnea. 8. Resolving enterococcus urinary tract infection. 9. Hypothyroidism. 10.History of tobacco abuse. 11.Hiatus hernia. 12.History of seizure disorder. 13.History of schizoaffective disorder.  DISCHARGE MEDICATIONS: 1. Nitrofurantoin 100 mg 1 capsule twice daily for 1 week. 2. Albuterol inhaler 2 puffs every 6 hours as needed. 3. Aspirin 81 mg 1 tablet daily. 4. Pulmicort nebulizer twice daily as before. 5. Thorazine 50 mg twice daily as before. 6. Depakote 1000 mg twice daily as before. 7. Flonase 2 puffs as before. 8. Folic acid 1 mg daily as before. 9. Levothyroxine 150 mcg daily as before. 10.Multivitamin with minerals 1 tablet daily as before. 11.Nitrostat 0.4 mg sublingual use as directed. 12.Omeprazole 20 mg 1  daily. 13.[name] 6 mg twice daily as before. 14.Polyethylene glycol 17 g 3 times daily as needed for constipation. 15.Tramadol 50 mg every 6 hours as needed for pain.  The patient has been advised to stop ibuprofen, levofloxacin, metoprolol, and Zofran.  DIET:  Low salt, low cholesterol, weight reducing diet.  ACTIVITY:  Increase activity slowly as tolerated.  The patient will be discharged to skilled nursing facility.  BRIEF HISTORY AND HOSPITAL COURSE:  [name] is a 62 year old female with past medical history significant for multiple medical problems, i.e. hypertension, history of paroxysmal atrial flutter, COPD, obstructive sleep apnea/obesity hypoventilation syndrome, hypothyroidism, tobacco abuse, morbid obesity, history of hiatus hernia, seizure disorder, history of schizoaffective disorder.  She came to the ER complaining of progressive increasing shortness of breath associated with cough and chills for last few days, associated with pleuritic chest pain.  Also complains of leg pain, and swelling and was noted to have mildly elevated D-dimer, and was noted to be hypoxic.  The patient subsequently had spiral CT of the chest which was negative for pulmonary embolism.  The patient denies any anginal chest pain, nausea, vomiting, diaphoresis.  Denies palpitation, lightheadedness, or syncope.  The patient noncompliant to medication, diet, and followup.  Denies any weakness in the arms or legs.  PHYSICAL EXAMINATION:  VITAL SIGNS:  Her blood pressure was 98/54.  She had sinus tach on the monitor.  Heart rate 110.  She was afebrile. EYES:  Conjunctiva  was pink. NECK:  Supple.  No JVD.  No bruit. LUNGS:  She has decreased breath sounds at bases with occasional rhonchi. CARDIOVASCULAR:  S1, S2 was soft.  There was soft and systolic murmur. No S3 gallop. ABDOMEN:  Soft, obese, and nontender. EXTREMITIES:  There was no clubbing, cyanosis.  There was trace  edema noted.  LABORATORY DATA:  Sodium was 139, potassium 3.6, BUN was 8, creatinine was 0.67.  Her three sets of cardiac enzymes were normal.  Hemoglobin was 14.1, hematocrit 44.2, white count of 4.3.  Her ABGs, pH was 7.22, pCO2 77, pO2 75, bicarb of 31, sats 91%.  Repeat ABGs pH 7.20, pCO2 81, pO2 72.  Sat is 89%.  BRIEF HOSPITAL COURSE:  The patient was admitted to initially step-down unit and was subsequently transferred to ICU because of acute hypercarbic respiratory failure.  Critical Care consultation was obtained.  The patient refused absolutely for intubation. and was maintained on BiPAP.  Her narcotics were completely discontinued.  The patient was subsequently weaned off from BiPAP and O2 nasal cannula with sats remained above 92%.  OT/PT consultation was called.  The patient required extensive help even getting out of the bed.  Discussed with the patient regarding skilled nursing facility and rehab.  Initially, the patient refused for it and wanted to go home but finally agreed to go to a skilled nursing facility for short-term.  The patient also had dysuria during the hospital stay.  Urine culture initially the patient was started on p.o. Cipro which was switched to nitrofurantoin as the patient had enterococcus in the urine which is sensitive to nitrofurantoin.  The patient is allergic to penicillin. The patient remained afebrile during the hospital stay.  The patient will be transferred to a skilled nursing facility and will be followed up in my office in 1 week and Pulmonary as outpatient in 2 weeks.     Eduardo Osier. Sharyn Lull, M.D.     [facility]/MEDQ  D:  08/14/2012  T:  08/14/2012  Job:  [id number]

## 2012-08-14 NOTE — Progress Notes (Addendum)
Report called to [facility]. Pt to be transported by PTAR. Significant other at bedside and updated.

## 2012-08-15 ENCOUNTER — Encounter (HOSPITAL_COMMUNITY): Payer: Self-pay

## 2012-08-15 ENCOUNTER — Emergency Department (HOSPITAL_COMMUNITY)
Admission: EM | Admit: 2012-08-15 | Discharge: 2012-08-16 | Disposition: A | Discharge status: Skilled Nursing Facility | Payer: Medicare Other | Attending: Emergency Medicine | Admitting: Emergency Medicine

## 2012-08-15 ENCOUNTER — Emergency Department (HOSPITAL_COMMUNITY): Payer: Medicare Other

## 2012-08-15 DIAGNOSIS — K219 Gastro-esophageal reflux disease without esophagitis: Secondary | ICD-10-CM | POA: Insufficient documentation

## 2012-08-15 DIAGNOSIS — I509 Heart failure, unspecified: Secondary | ICD-10-CM | POA: Insufficient documentation

## 2012-08-15 DIAGNOSIS — Z87440 Personal history of urinary (tract) infections: Secondary | ICD-10-CM | POA: Insufficient documentation

## 2012-08-15 DIAGNOSIS — Z7982 Long term (current) use of aspirin: Secondary | ICD-10-CM | POA: Insufficient documentation

## 2012-08-15 DIAGNOSIS — I10 Essential (primary) hypertension: Secondary | ICD-10-CM | POA: Insufficient documentation

## 2012-08-15 DIAGNOSIS — G8929 Other chronic pain: Secondary | ICD-10-CM | POA: Insufficient documentation

## 2012-08-15 DIAGNOSIS — F259 Schizoaffective disorder, unspecified: Secondary | ICD-10-CM

## 2012-08-15 DIAGNOSIS — Z8739 Personal history of other diseases of the musculoskeletal system and connective tissue: Secondary | ICD-10-CM | POA: Insufficient documentation

## 2012-08-15 DIAGNOSIS — J4489 Other specified chronic obstructive pulmonary disease: Secondary | ICD-10-CM | POA: Insufficient documentation

## 2012-08-15 DIAGNOSIS — Z862 Personal history of diseases of the blood and blood-forming organs and certain disorders involving the immune mechanism: Secondary | ICD-10-CM | POA: Insufficient documentation

## 2012-08-15 DIAGNOSIS — Z8701 Personal history of pneumonia (recurrent): Secondary | ICD-10-CM | POA: Insufficient documentation

## 2012-08-15 DIAGNOSIS — G40909 Epilepsy, unspecified, not intractable, without status epilepticus: Secondary | ICD-10-CM | POA: Insufficient documentation

## 2012-08-15 DIAGNOSIS — Z87891 Personal history of nicotine dependence: Secondary | ICD-10-CM | POA: Insufficient documentation

## 2012-08-15 DIAGNOSIS — E039 Hypothyroidism, unspecified: Secondary | ICD-10-CM | POA: Insufficient documentation

## 2012-08-15 DIAGNOSIS — J449 Chronic obstructive pulmonary disease, unspecified: Secondary | ICD-10-CM | POA: Insufficient documentation

## 2012-08-15 DIAGNOSIS — E7800 Pure hypercholesterolemia, unspecified: Secondary | ICD-10-CM | POA: Insufficient documentation

## 2012-08-15 DIAGNOSIS — I252 Old myocardial infarction: Secondary | ICD-10-CM | POA: Insufficient documentation

## 2012-08-15 DIAGNOSIS — Z8679 Personal history of other diseases of the circulatory system: Secondary | ICD-10-CM | POA: Insufficient documentation

## 2012-08-15 DIAGNOSIS — Z8719 Personal history of other diseases of the digestive system: Secondary | ICD-10-CM | POA: Insufficient documentation

## 2012-08-15 DIAGNOSIS — E119 Type 2 diabetes mellitus without complications: Secondary | ICD-10-CM | POA: Insufficient documentation

## 2012-08-15 DIAGNOSIS — Z79899 Other long term (current) drug therapy: Secondary | ICD-10-CM | POA: Insufficient documentation

## 2012-08-15 DIAGNOSIS — F603 Borderline personality disorder: Secondary | ICD-10-CM | POA: Insufficient documentation

## 2012-08-15 DIAGNOSIS — J45909 Unspecified asthma, uncomplicated: Secondary | ICD-10-CM | POA: Insufficient documentation

## 2012-08-15 LAB — COMPREHENSIVE METABOLIC PANEL
ALT: 12 U/L (ref 0–35)
AST: 22 U/L (ref 0–37)
CO2: 32 mEq/L (ref 19–32)
Chloride: 97 mEq/L (ref 96–112)
Creatinine, Ser: 0.62 mg/dL (ref 0.50–1.10)
GFR calc non Af Amer: >90 mL/min (ref >90)
Glucose, Bld: 103 mg/dL — ABNORMAL HIGH (ref 70–99)
Total Bilirubin: 0.6 mg/dL (ref 0.3–1.2)

## 2012-08-15 LAB — CBC WITH DIFFERENTIAL/PLATELET
Basophils Relative: 0 % (ref 0–1)
Eosinophils Relative: 1 % (ref 0–5)
HCT: 41.8 % (ref 36.0–46.0)
Hemoglobin: 13.7 g/dL (ref 12.0–15.0)
Lymphs Abs: 1.8 10*3/uL (ref 0.7–4.0)
MCV: 99.8 fL (ref 78.0–100.0)
Monocytes Relative: 15 % — ABNORMAL HIGH (ref 3–12)
Platelets: 104 10*3/uL — ABNORMAL LOW (ref 150–400)
RBC: 4.19 MIL/uL (ref 3.87–5.11)
WBC: 6.1 10*3/uL (ref 4.0–10.5)

## 2012-08-15 LAB — URINALYSIS, ROUTINE W REFLEX MICROSCOPIC
Glucose, UA: NEGATIVE mg/dL
Hgb urine dipstick: NEGATIVE
Ketones, ur: NEGATIVE mg/dL
Protein, ur: NEGATIVE mg/dL

## 2012-08-15 LAB — RAPID URINE DRUG SCREEN, HOSP PERFORMED
Amphetamines: NOT DETECTED
Tetrahydrocannabinol: NOT DETECTED

## 2012-08-15 LAB — ETHANOL: Alcohol, Ethyl (B): <11 mg/dL (ref 0–11)

## 2012-08-15 MED ORDER — PALIPERIDONE ER 6 MG PO TB24
6.0000 mg | ORAL_TABLET | Freq: Two times a day (BID) | ORAL | Status: DC
  Administered 2012-08-15 – 2012-08-16 (×2): 6 mg via ORAL
  Filled 2012-08-15 (×4): qty 1

## 2012-08-15 MED ORDER — BUDESONIDE 0.25 MG/2ML IN SUSP
0.2500 mg | Freq: Two times a day (BID) | RESPIRATORY_TRACT | Status: DC
  Administered 2012-08-15: 0.25 mg via RESPIRATORY_TRACT
  Filled 2012-08-15 (×3): qty 2

## 2012-08-15 MED ORDER — LEVOTHYROXINE SODIUM 150 MCG PO TABS
150.0000 ug | ORAL_TABLET | Freq: Every day | ORAL | Status: DC
  Administered 2012-08-16: 150 ug via ORAL
  Filled 2012-08-15 (×2): qty 1

## 2012-08-15 MED ORDER — DIVALPROEX SODIUM 500 MG PO DR TAB
1000.0000 mg | DELAYED_RELEASE_TABLET | Freq: Two times a day (BID) | ORAL | Status: DC
  Administered 2012-08-15 – 2012-08-16 (×2): 1000 mg via ORAL
  Filled 2012-08-15 (×2): qty 2

## 2012-08-15 MED ORDER — LORAZEPAM 2 MG/ML IJ SOLN
2.0000 mg | Freq: Once | INTRAMUSCULAR | Status: DC
  Filled 2012-08-15: qty 1

## 2012-08-15 MED ORDER — PANTOPRAZOLE SODIUM 40 MG PO TBEC
40.0000 mg | DELAYED_RELEASE_TABLET | Freq: Every day | ORAL | Status: DC
  Administered 2012-08-15 – 2012-08-16 (×2): 40 mg via ORAL
  Filled 2012-08-15 (×2): qty 1

## 2012-08-15 MED ORDER — LORAZEPAM 2 MG/ML IJ SOLN
2.0000 mg | Freq: Once | INTRAMUSCULAR | Status: AC
  Administered 2012-08-15: 2 mg via INTRAMUSCULAR

## 2012-08-15 MED ORDER — ASPIRIN EC 81 MG PO TBEC
81.0000 mg | DELAYED_RELEASE_TABLET | Freq: Every morning | ORAL | Status: DC
  Administered 2012-08-16: 81 mg via ORAL
  Filled 2012-08-15: qty 1

## 2012-08-15 MED ORDER — ALBUTEROL SULFATE HFA 108 (90 BASE) MCG/ACT IN AERS: 2.0000 | INHALATION_SPRAY | Freq: Four times a day (QID) | RESPIRATORY_TRACT | Status: DC | PRN

## 2012-08-15 MED ORDER — ADULT MULTIVITAMIN W/MINERALS CH
1.0000 | ORAL_TABLET | Freq: Every morning | ORAL | Status: DC
  Administered 2012-08-16: 1 via ORAL
  Filled 2012-08-15: qty 1

## 2012-08-15 MED ORDER — FOLIC ACID 1 MG PO TABS
1.0000 mg | ORAL_TABLET | Freq: Every morning | ORAL | Status: DC
  Administered 2012-08-16: 1 mg via ORAL
  Filled 2012-08-15: qty 1

## 2012-08-15 MED ORDER — CHLORPROMAZINE HCL 25 MG PO TABS
50.0000 mg | ORAL_TABLET | Freq: Two times a day (BID) | ORAL | Status: DC
  Administered 2012-08-15 – 2012-08-16 (×2): 50 mg via ORAL
  Filled 2012-08-15 (×2): qty 2
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
## 2012-08-15 NOTE — ED Notes (Signed)
X-ray at bedside

## 2012-08-15 NOTE — ED Notes (Signed)
[id number]<AV> Expected date:<BR> Expected time:<BR> Means of arrival:<BR> Comments:<BR> Hold for [place]
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
## 2012-08-16 MED ORDER — DIPHENHYDRAMINE HCL 50 MG/ML IJ SOLN
25.0000 mg | Freq: Once | INTRAMUSCULAR | Status: AC
  Administered 2012-08-16: 50 mg via INTRAMUSCULAR
  Filled 2012-08-16: qty 1

## 2012-08-16 NOTE — ED Provider Notes (Signed)
[name] is a 62 y.o. female who is apparently, here for aggressive behavior. She was initially evaluated yesterday, and has been too sedating medicines. At this time. She is to sleepy to awaken. She received 2 mg of Ativan at about the with Benadryl. Patient's admitting behavior appears to be at her baseline.  08:15-Plan: We'll reassess when she wakes up this morning.   10:15- she is now awake and alert and at her baseline. She'll be discharged back to her usual living setting.  Diagnoses: Schizo-affective disorder with manipulative behavior  Flint Melter, MD 08/16/12 1017

## 2012-08-16 NOTE — Clinical Social Work Note (Signed)
CSW received a call from Geanie Berlin at [facility] where pt resides.  Tammy voicing frustration over [employer] inability to deal with pt behaviors.  CSW was third party to discussion with EDP, Effie Shy and RN, Toni Amend earlier this morning re: pt d/c plans to return to SNF.  CSW explained to Tammy that pt behaviors are baseline and would need to be addressed in an outpatient setting.  CSW also explained that from my discussion with EDP and my chart review, pt did not meet criteria for acute inpatient psychiatric treatment.  Her behaviors seem to be baseline.  CSW suggested f/u with SNF psychiatrist for medication management.  Per chart, pt was d/c from [facility] and without meds for a period of time which could have exacerbated her behaviors. CSW unsure and not qualified to speak for certain.  CSW expressed the importance for pt to have medications administered as prescribed.  Tammy agreed.  Tammy, though frustrated with not knowing how the facility will be able to handle the pt's behaviors, was appreciative of CSW time and explaination of disposition.    Vickii Penna, LCSWA [phone]  Clinical Social Work

## 2012-08-16 NOTE — ED Notes (Signed)
Pt placed in hospital bed

## 2012-08-16 NOTE — ED Notes (Signed)
Pt discharged back to [facility]; pt is upset about being discharged; PTAR to transport pt; pt stable at time of transport

## 2012-08-22 IMAGING — CR DG CHEST 2V
2 series · 2 of 2 positions shown · non-contrast
Comparison: Chest x-ray 03/10/2011.

CLINICAL DATA: Medical clearance.  Leukocytosis.

CHEST - 2 VIEW

[w chest pa]
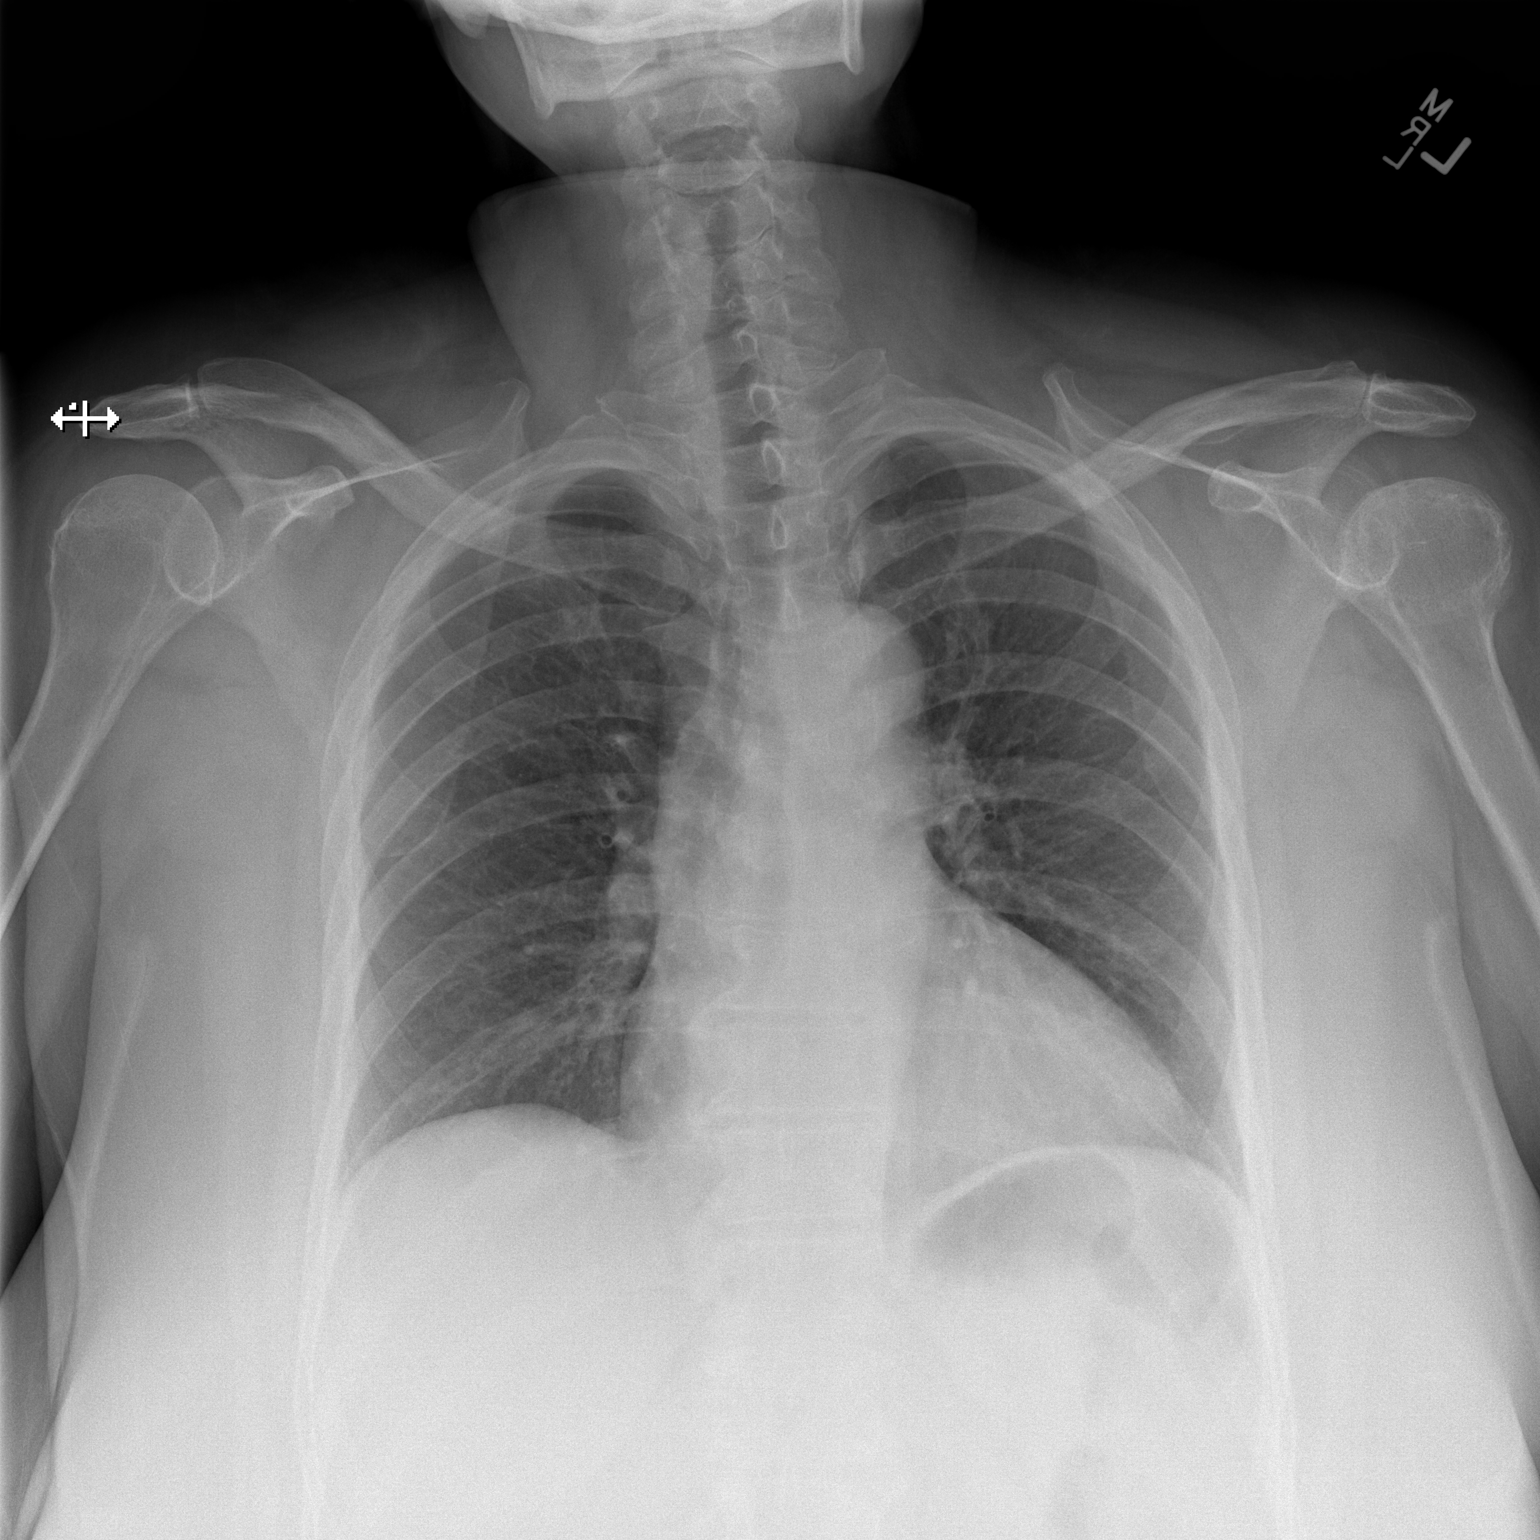

[w chest lat]
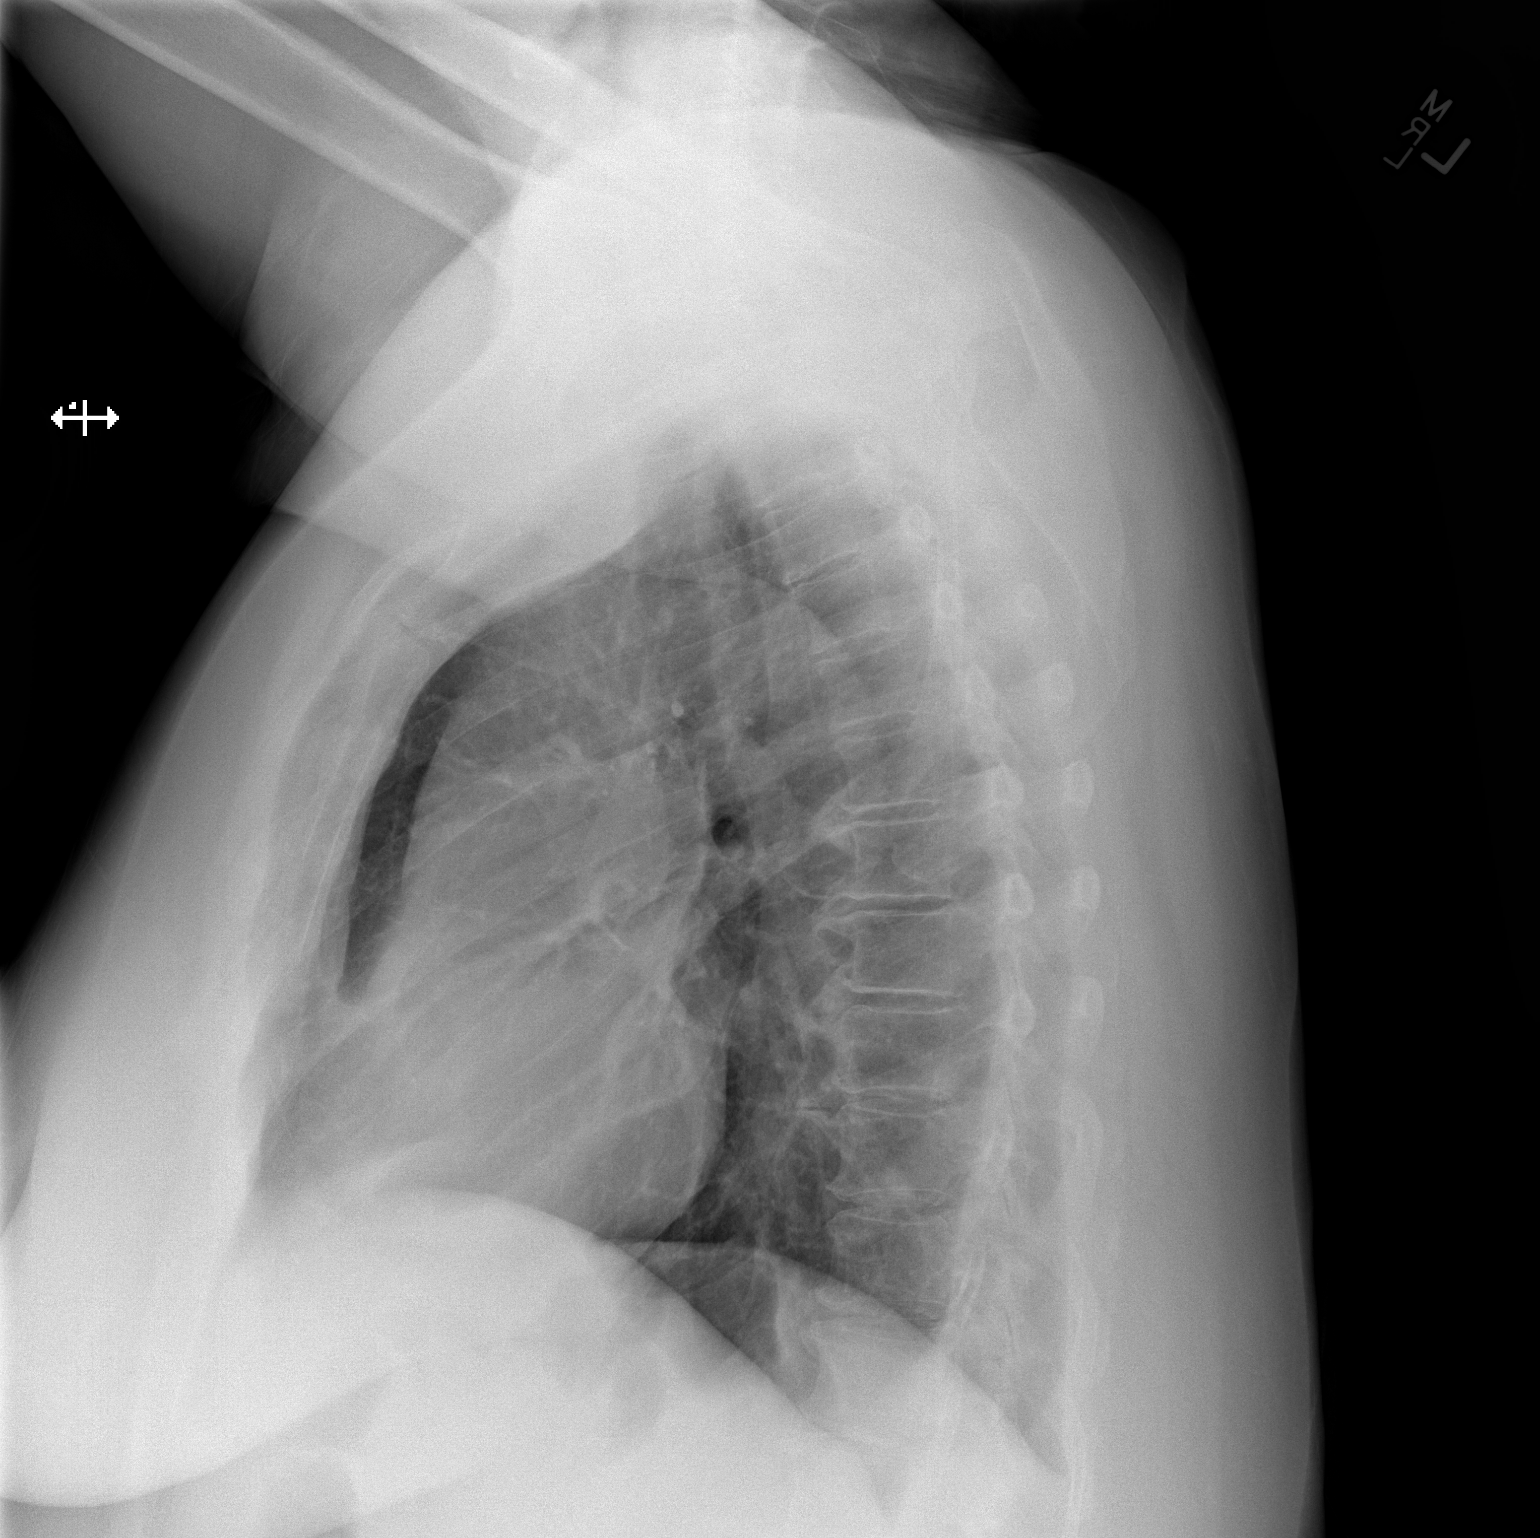

[2 of 2 positions shown; findings below may reference images not displayed]

FINDINGS: The heart is upper limits of normal and stable.  The
mediastinal and hilar contours are stable.  The lungs are clear.
The bony thorax is intact peri
IMPRESSION: No acute cardiopulmonary findings.

## 2012-08-27 ENCOUNTER — Emergency Department (HOSPITAL_COMMUNITY)
Admission: EM | Admit: 2012-08-27 | Discharge: 2012-08-28 | Disposition: A | Discharge status: Home / Self Care | Payer: Medicare Other | Attending: Emergency Medicine | Admitting: Emergency Medicine

## 2012-08-27 ENCOUNTER — Encounter (HOSPITAL_COMMUNITY): Payer: Self-pay | Admitting: *Deleted

## 2012-08-27 DIAGNOSIS — R5381 Other malaise: Secondary | ICD-10-CM | POA: Insufficient documentation

## 2012-08-27 DIAGNOSIS — R269 Unspecified abnormalities of gait and mobility: Secondary | ICD-10-CM | POA: Insufficient documentation

## 2012-08-27 DIAGNOSIS — J4489 Other specified chronic obstructive pulmonary disease: Secondary | ICD-10-CM | POA: Insufficient documentation

## 2012-08-27 DIAGNOSIS — Z7982 Long term (current) use of aspirin: Secondary | ICD-10-CM | POA: Insufficient documentation

## 2012-08-27 DIAGNOSIS — E119 Type 2 diabetes mellitus without complications: Secondary | ICD-10-CM | POA: Insufficient documentation

## 2012-08-27 DIAGNOSIS — Z79899 Other long term (current) drug therapy: Secondary | ICD-10-CM | POA: Insufficient documentation

## 2012-08-27 DIAGNOSIS — Z8701 Personal history of pneumonia (recurrent): Secondary | ICD-10-CM | POA: Insufficient documentation

## 2012-08-27 DIAGNOSIS — K219 Gastro-esophageal reflux disease without esophagitis: Secondary | ICD-10-CM | POA: Insufficient documentation

## 2012-08-27 DIAGNOSIS — I252 Old myocardial infarction: Secondary | ICD-10-CM | POA: Insufficient documentation

## 2012-08-27 DIAGNOSIS — I10 Essential (primary) hypertension: Secondary | ICD-10-CM | POA: Insufficient documentation

## 2012-08-27 DIAGNOSIS — Z8739 Personal history of other diseases of the musculoskeletal system and connective tissue: Secondary | ICD-10-CM | POA: Insufficient documentation

## 2012-08-27 DIAGNOSIS — Z8679 Personal history of other diseases of the circulatory system: Secondary | ICD-10-CM | POA: Insufficient documentation

## 2012-08-27 DIAGNOSIS — E7800 Pure hypercholesterolemia, unspecified: Secondary | ICD-10-CM | POA: Insufficient documentation

## 2012-08-27 DIAGNOSIS — Z87891 Personal history of nicotine dependence: Secondary | ICD-10-CM | POA: Insufficient documentation

## 2012-08-27 DIAGNOSIS — R531 Weakness: Secondary | ICD-10-CM

## 2012-08-27 DIAGNOSIS — I509 Heart failure, unspecified: Secondary | ICD-10-CM | POA: Insufficient documentation

## 2012-08-27 DIAGNOSIS — E039 Hypothyroidism, unspecified: Secondary | ICD-10-CM | POA: Insufficient documentation

## 2012-08-27 DIAGNOSIS — Z8719 Personal history of other diseases of the digestive system: Secondary | ICD-10-CM | POA: Insufficient documentation

## 2012-08-27 DIAGNOSIS — Z87440 Personal history of urinary (tract) infections: Secondary | ICD-10-CM | POA: Insufficient documentation

## 2012-08-27 DIAGNOSIS — J449 Chronic obstructive pulmonary disease, unspecified: Secondary | ICD-10-CM | POA: Insufficient documentation
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
## 2012-08-28 MED ORDER — ADULT MULTIVITAMIN W/MINERALS CH
1.0000 | ORAL_TABLET | Freq: Every morning | ORAL | Status: DC
  Administered 2012-08-28: 1 via ORAL
  Filled 2012-08-28: qty 1

## 2012-08-28 MED ORDER — FLUTICASONE PROPIONATE 50 MCG/ACT NA SUSP
2.0000 | Freq: Every day | NASAL | Status: DC | PRN
  Filled 2012-08-28: qty 16

## 2012-08-28 MED ORDER — WHITE PETROLATUM GEL
Status: AC
  Administered 2012-08-28: 13:00:00
  Filled 2012-08-28: qty 5

## 2012-08-28 MED ORDER — ALBUTEROL SULFATE HFA 108 (90 BASE) MCG/ACT IN AERS: 2.0000 | INHALATION_SPRAY | Freq: Four times a day (QID) | RESPIRATORY_TRACT | Status: DC | PRN

## 2012-08-28 MED ORDER — PALIPERIDONE ER 6 MG PO TB24
6.0000 mg | ORAL_TABLET | Freq: Two times a day (BID) | ORAL | Status: DC
  Administered 2012-08-28: 6 mg via ORAL
  Filled 2012-08-28 (×2): qty 1

## 2012-08-28 MED ORDER — CHLORPROMAZINE HCL 50 MG PO TABS
50.0000 mg | ORAL_TABLET | Freq: Two times a day (BID) | ORAL | Status: DC
  Administered 2012-08-28: 50 mg via ORAL
  Filled 2012-08-28: qty 1
  Filled 2012-08-28: qty 2

## 2012-08-28 MED ORDER — NITROGLYCERIN 0.4 MG SL SUBL: 0.4000 mg | SUBLINGUAL_TABLET | SUBLINGUAL | Status: DC | PRN

## 2012-08-28 MED ORDER — ASPIRIN EC 81 MG PO TBEC
81.0000 mg | DELAYED_RELEASE_TABLET | Freq: Every morning | ORAL | Status: DC
  Administered 2012-08-28: 81 mg via ORAL
  Filled 2012-08-28: qty 1

## 2012-08-28 MED ORDER — LEVOTHYROXINE SODIUM 150 MCG PO TABS
150.0000 ug | ORAL_TABLET | Freq: Every day | ORAL | Status: DC
  Administered 2012-08-28: 150 ug via ORAL
  Filled 2012-08-28 (×2): qty 1

## 2012-08-28 MED ORDER — PANTOPRAZOLE SODIUM 40 MG PO TBEC
40.0000 mg | DELAYED_RELEASE_TABLET | Freq: Every day | ORAL | Status: DC
  Administered 2012-08-28: 40 mg via ORAL
  Filled 2012-08-28: qty 1

## 2012-08-28 MED ORDER — DIVALPROEX SODIUM ER 500 MG PO TB24
500.0000 mg | ORAL_TABLET | Freq: Two times a day (BID) | ORAL | Status: DC
  Administered 2012-08-28: 500 mg via ORAL
  Filled 2012-08-28 (×2): qty 1

## 2012-08-28 MED ORDER — FOLIC ACID 1 MG PO TABS
1.0000 mg | ORAL_TABLET | Freq: Every morning | ORAL | Status: DC
  Administered 2012-08-28: 1 mg via ORAL
  Filled 2012-08-28: qty 1

## 2012-08-28 MED ORDER — BUDESONIDE 0.25 MG/2ML IN SUSP
0.2500 mg | Freq: Two times a day (BID) | RESPIRATORY_TRACT | Status: DC
  Administered 2012-08-28: 0.25 mg via RESPIRATORY_TRACT
  Filled 2012-08-28 (×2): qty 2

## 2012-08-28 MED ORDER — TRAMADOL HCL 50 MG PO TABS
50.0000 mg | ORAL_TABLET | Freq: Three times a day (TID) | ORAL | Status: DC | PRN
  Administered 2012-08-28: 50 mg via ORAL
  Filled 2012-08-28 (×2): qty 1

## 2012-08-28 MED ORDER — POLYETHYLENE GLYCOL 3350 17 G PO PACK
17.0000 g | PACK | ORAL | Status: DC
  Filled 2012-08-28: qty 1

## 2012-08-28 NOTE — ED Notes (Signed)
Almira Coaster, Child psychotherapist at bedside.

## 2012-08-28 NOTE — ED Notes (Signed)
[facility] called  

## 2012-08-28 NOTE — ED Provider Notes (Signed)
6:17 AM Patient signed out to me by Earley Favor, NP. Patient pending social work consult.   11:58 AM With the help of social work and case management, patient and caregiver agreed to let the patient return home with assistance and supplies. I will discharge the patient with orders for PT, OT, RN, aide, bedside commode, and wheelchair. Patient will be discharged without further evaluation.   Emilia Beck, [place] 08/29/12 [phone]

## 2012-08-28 NOTE — Progress Notes (Signed)
[facility] ED CM assessed pt for orientation She is alert and oriented x 3 Told me her name, "[name]" , "at [employer]", "[name] went to see the lawyer" "[name] is my fiance" " I have medicaid and medicare" and " I have daughters".  Pt reports wanting to return home and not snf She informed CM she chooses [facility] for services She informed MC she needs a w/c and bedside commode.  Pt dialed [name] number from her ED room telephone and allowed CM to speak with him.  He confirmed he is presently going to see "an attorney" and will return to the home in 2 hrs He confirms he is the only care givers and pt does not have any daughters.  CM and pt reviewed d/c plans for home with [facility] for RN, therapies, aide and private duty nursing (PDN) services ([facility] per pt) with [name].  CM will send home a list of other PDN agencies, [facility] PACE information and [facility] contact information for [name] He is in agreement to pt being d/c home vs snf.  Pt requesting "four PTAR people" to help "get me to my bed" CM updated ED RN, [name], ED PA, [name] of [facility] [phone]) and Called in DME (w/c, bedside commode) to [name], [facility] DME coordinator [phone] CM checked with [name] in [facility] ED registration to verify pt does not have medicaid via [place] track.
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
## 2012-08-28 NOTE — ED Notes (Signed)
Per Social Work, they will be working on placement for pt in the morning.
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
## 2012-08-28 NOTE — Clinical Social Work Note (Signed)
CSW spoke to ED RNCM re: home health, private duty nursing and any other home assistance that pt is eligible for.  RNCM aware and will look into this piece of pt care.  Vickii Penna, LCSWA [phone]  Clinical Social Work

## 2012-08-28 NOTE — ED Provider Notes (Signed)
Medical screening examination/treatment/procedure(s) were conducted as a shared visit with non-physician practitioner(s) and myself.  I personally evaluated the patient during the encounter  Doug Sou, MD 08/28/12 [phone]
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
## 2012-08-28 NOTE — Progress Notes (Signed)
CM made 2 attempts to reach [facility] at [phone] without success Cm sending home [facility] application from web site home to [place]

## 2012-08-28 NOTE — ED Notes (Signed)
Social worker paged  

## 2012-08-30 NOTE — ED Provider Notes (Signed)
Medical screening examination/treatment/procedure(s) were conducted as a shared visit with non-physician practitioner(s) and myself.  I personally evaluated the patient during the encounter  Doug Sou, MD 08/30/12 1731

## 2012-08-31 ENCOUNTER — Emergency Department (HOSPITAL_COMMUNITY)
Admission: EM | Admit: 2012-08-31 | Discharge: 2012-08-31 | Disposition: A | Discharge status: Home / Self Care | Payer: Medicare Other | Source: Home / Self Care | Attending: Emergency Medicine | Admitting: Emergency Medicine

## 2012-08-31 ENCOUNTER — Emergency Department (HOSPITAL_COMMUNITY): Payer: Medicare Other

## 2012-08-31 DIAGNOSIS — K219 Gastro-esophageal reflux disease without esophagitis: Secondary | ICD-10-CM | POA: Insufficient documentation

## 2012-08-31 DIAGNOSIS — M60009 Infective myositis, unspecified site: Secondary | ICD-10-CM | POA: Insufficient documentation

## 2012-08-31 DIAGNOSIS — Z8701 Personal history of pneumonia (recurrent): Secondary | ICD-10-CM | POA: Insufficient documentation

## 2012-08-31 DIAGNOSIS — Z87891 Personal history of nicotine dependence: Secondary | ICD-10-CM | POA: Insufficient documentation

## 2012-08-31 DIAGNOSIS — Z87440 Personal history of urinary (tract) infections: Secondary | ICD-10-CM | POA: Insufficient documentation

## 2012-08-31 DIAGNOSIS — I10 Essential (primary) hypertension: Secondary | ICD-10-CM | POA: Insufficient documentation

## 2012-08-31 DIAGNOSIS — G8929 Other chronic pain: Secondary | ICD-10-CM | POA: Insufficient documentation

## 2012-08-31 DIAGNOSIS — Z79899 Other long term (current) drug therapy: Secondary | ICD-10-CM | POA: Insufficient documentation

## 2012-08-31 DIAGNOSIS — E669 Obesity, unspecified: Secondary | ICD-10-CM | POA: Insufficient documentation

## 2012-08-31 DIAGNOSIS — Z8719 Personal history of other diseases of the digestive system: Secondary | ICD-10-CM | POA: Insufficient documentation

## 2012-08-31 DIAGNOSIS — Z862 Personal history of diseases of the blood and blood-forming organs and certain disorders involving the immune mechanism: Secondary | ICD-10-CM | POA: Insufficient documentation

## 2012-08-31 DIAGNOSIS — E119 Type 2 diabetes mellitus without complications: Secondary | ICD-10-CM | POA: Insufficient documentation

## 2012-08-31 DIAGNOSIS — J45902 Unspecified asthma with status asthmaticus: Secondary | ICD-10-CM | POA: Insufficient documentation

## 2012-08-31 DIAGNOSIS — R0789 Other chest pain: Secondary | ICD-10-CM | POA: Insufficient documentation

## 2012-08-31 DIAGNOSIS — Z8659 Personal history of other mental and behavioral disorders: Secondary | ICD-10-CM | POA: Insufficient documentation

## 2012-08-31 DIAGNOSIS — Z8679 Personal history of other diseases of the circulatory system: Secondary | ICD-10-CM | POA: Insufficient documentation

## 2012-08-31 DIAGNOSIS — E039 Hypothyroidism, unspecified: Secondary | ICD-10-CM | POA: Insufficient documentation

## 2012-08-31 DIAGNOSIS — M79609 Pain in unspecified limb: Secondary | ICD-10-CM | POA: Insufficient documentation

## 2012-08-31 DIAGNOSIS — Z8669 Personal history of other diseases of the nervous system and sense organs: Secondary | ICD-10-CM | POA: Insufficient documentation

## 2012-08-31 MED ORDER — OXYCODONE-ACETAMINOPHEN 5-325 MG PO TABS
2.0000 | ORAL_TABLET | Freq: Once | ORAL | Status: AC
  Administered 2012-08-31: 2 via ORAL
  Filled 2012-08-31: qty 2

## 2012-08-31 NOTE — ED Provider Notes (Signed)
History     CSN: [id number]  Arrival date & time 08/31/12  1723   First MD Initiated Contact with Patient 08/31/12 1845      Chief Complaint  Patient presents with  . Extremity Weakness    (Consider location/radiation/quality/duration/timing/severity/associated sxs/prior treatment) HPI Comments: Patient presents from home with extremity pain and chest pain. She was just years 3 days ago and had decided that she no longer wanted to live in a skilled nursing facility. She states that she wanted to go with at home social worker was consultative and ultimately they were able to set up home health services for her to live at home. She was discharged to home and she currently lives there with her fianc. She comes in today complaining of bilateral extremity pain which she says has been going on for the last 2 months. She states that she takes Ultram for the pain but it doesn't help. She's followed by Dr. Sharyn Lull.  She also states that she's had pain across her chest since yesterday. She states it's been constant and is unchanged since yesterday. It's worse with movement. She denies any shortness of breath. She complains of a mild cough. She denies he fevers or chills.  Patient is a 62 y.o. female presenting with extremity weakness.  Extremity Weakness Associated symptoms include chest pain (She has pain is reproducible across the chest wall). Pertinent negatives include no abdominal pain, no headaches and no shortness of breath.    Past Medical History  Diagnosis Date  . Schizoaffective disorder   . Urinary tract infection   . Hypertension   . GERD (gastroesophageal reflux disease)   . Hypothyroidism   . Obesity   . Asthma   . COPD (chronic obstructive pulmonary disease)   . CHF (congestive heart failure)   . Seizures   . Morbid obesity   . Chronic pain   . Hypercholesteremia   . Anginal pain   . Myocardial infarction 2002  . Pneumonia     "several times" (05/23/2012)  . Diabetes  mellitus     "I'm not diabetic anymore" (05/23/2012)  . Anemia   . History of blood transfusion 1993    "when I had my hysterectomy" (05/23/2012)  . H/O hiatal hernia   . Daily headache   . Arthritis     "severe; all over my body" (05/23/2012)  . Hypoventilation syndrome     [name] 05/30/2012  . Atrial fibrillation   . Atrial flutter, paroxysmal     [name] 05/30/2012  . Exertional dyspnea   . Shortness of breath     "all the time lately" (05/30/2012)    Past Surgical History  Procedure Laterality Date  . Vaginal hysterectomy  1993  . Tubal ligation  1998  . Cholecystectomy  ?2008  . Cardiac catheterization  2008    Family History  Problem Relation Age of Onset  . Heart disease Father   . Cancer Mother 41    Breast    History  Substance Use Topics  . Smoking status: Former Smoker -- 2.00 packs/day for 12 years    Types: Cigarettes    Quit date: 06/29/2000  . Smokeless tobacco: Current User    Types: Snuff, Chew     Comment: 05/30/2012 "still have what you gave me last time"  . Alcohol Use: No    OB History   Grav Para Term Preterm Abortions TAB SAB Ect Mult Living  Review of Systems  Constitutional: Negative for fever, chills, diaphoresis and fatigue.  HENT: Negative for congestion, rhinorrhea and sneezing.   Eyes: Negative.   Respiratory: Negative for cough, chest tightness and shortness of breath.   Cardiovascular: Positive for chest pain (She has pain is reproducible across the chest wall). Negative for leg swelling.  Gastrointestinal: Negative for nausea, vomiting, abdominal pain, diarrhea and blood in stool.  Genitourinary: Negative for frequency, hematuria, flank pain and difficulty urinating.  Musculoskeletal: Positive for arthralgias and extremity weakness. Negative for back pain.  Skin: Negative for rash.  Neurological: Negative for dizziness, speech difficulty, weakness, numbness and headaches.    Allergies  Food; Penicillins; and  Sulfamethoxazole w-trimethoprim  Home Medications   Current Outpatient Rx  Name  Route  Sig  Dispense  Refill  . albuterol (PROVENTIL HFA;VENTOLIN HFA) 108 (90 BASE) MCG/ACT inhaler   Inhalation   Inhale 2 puffs into the lungs every 6 (six) hours as needed for wheezing.         Marland Kitchen aspirin EC 81 MG tablet   Oral   Take 81 mg by mouth every morning.          . budesonide (PULMICORT) 0.25 MG/2ML nebulizer solution   Nebulization   Take 0.25 mg by nebulization 2 (two) times daily.         . chlorproMAZINE (THORAZINE) 50 MG tablet   Oral   Take 50-200 mg by mouth 3 (three) times daily - between meals and at bedtime. 50mg  twice a day and 200mg  at bedtime         . divalproex (DEPAKOTE ER) 500 MG 24 hr tablet   Oral   Take 1,000 mg by mouth 2 (two) times daily.          . fluticasone (FLONASE) 50 MCG/ACT nasal spray   Nasal   Place 2 sprays into the nose daily as needed. For nasal congestion         . levothyroxine (SYNTHROID, LEVOTHROID) 150 MCG tablet   Oral   Take 150 mcg by mouth every morning. For thyroid disease.         . metoprolol succinate (TOPROL-XL) 50 MG 24 hr tablet   Oral   Take 50 mg by mouth daily. Take with or immediately following a meal.         . Multiple Vitamin (MULTIVITAMIN WITH MINERALS) TABS   Oral   Take 1 tablet by mouth every morning. For nutritional supplement.         . nitroGLYCERIN (NITROSTAT) 0.4 MG SL tablet   Sublingual   Place 0.4 mg under the tongue every 5 (five) minutes as needed. For chest pain         . omeprazole (PRILOSEC) 20 MG capsule   Oral   Take 20 mg by mouth every morning.          . paliperidone ([name]) 6 MG 24 hr tablet   Oral   Take 6 mg by mouth 2 (two) times daily.         . polyethylene glycol (MIRALAX / GLYCOLAX) packet   Oral   Take 17 g by mouth every 3 (three) days.          . traMADol (ULTRAM) 50 MG tablet   Oral   Take 50 mg by mouth 4 (four) times daily.            BP  113/64  Pulse 89  Temp(Src) 97.6 F (36.4 C) (Oral)  Resp  20  SpO2 92%  Physical Exam  Constitutional: She is oriented to person, place, and time. She appears well-developed and well-nourished.  Morbidly obese  HENT:  Head: Normocephalic and atraumatic.  Eyes: Pupils are equal, round, and reactive to light.  Neck: Normal range of motion. Neck supple.  Cardiovascular: Normal rate, regular rhythm and normal heart sounds.   Pulmonary/Chest: Effort normal and breath sounds normal. No respiratory distress. She has no wheezes. She has no rales. She exhibits tenderness (Positive tenderness across the chest wall).  Abdominal: Soft. Bowel sounds are normal. There is no tenderness. There is no rebound and no guarding.  Musculoskeletal: Normal range of motion. She exhibits no edema.  Positive tenderness to palpation throughout her lower chin knees. She has normal color. Capillary refill is less than 2.  Lymphadenopathy:    She has no cervical adenopathy.  Neurological: She is alert and oriented to person, place, and time.  Skin: Skin is warm and dry. No rash noted.  Psychiatric: She has a normal mood and affect.    ED Course  Procedures (including critical care time)  Results for orders placed during the hospital encounter of 08/15/12  CBC WITH DIFFERENTIAL      Result Value Range   WBC 6.1  4.0 - 10.5 K/uL   RBC 4.19  3.87 - 5.11 MIL/uL   Hemoglobin 13.7  12.0 - 15.0 g/dL   HCT 82.9  56.2 - 13.0 %   MCV 99.8  78.0 - 100.0 fL   MCH 32.7  26.0 - 34.0 pg   MCHC 32.8  30.0 - 36.0 g/dL   RDW 86.5 (*) 78.4 - 69.6 %   Platelets 104 (*) 150 - 400 K/uL   Neutrophils Relative 55  43 - 77 %   Lymphocytes Relative 29  12 - 46 %   Monocytes Relative 15 (*) 3 - 12 %   Eosinophils Relative 1  0 - 5 %   Basophils Relative 0  0 - 1 %   Neutro Abs 3.3  1.7 - 7.7 K/uL   Lymphs Abs 1.8  0.7 - 4.0 K/uL   Monocytes Absolute 0.9  0.1 - 1.0 K/uL   Eosinophils Absolute 0.1  0.0 - 0.7 K/uL   Basophils  Absolute 0.0  0.0 - 0.1 K/uL   Smear Review PLATELET COUNT CONFIRMED BY SMEAR    COMPREHENSIVE METABOLIC PANEL      Result Value Range   Sodium 135  135 - 145 mEq/L   Potassium 3.9  3.5 - 5.1 mEq/L   Chloride 97  96 - 112 mEq/L   CO2 32  19 - 32 mEq/L   Glucose, Bld 103 (*) 70 - 99 mg/dL   BUN 9  6 - 23 mg/dL   Creatinine, Ser 2.95  0.50 - 1.10 mg/dL   Calcium 28.4  8.4 - 13.2 mg/dL   Total Protein 6.4  6.0 - 8.3 g/dL   Albumin 3.0 (*) 3.5 - 5.2 g/dL   AST 22  0 - 37 U/L   ALT 12  0 - 35 U/L   Alkaline Phosphatase 46  39 - 117 U/L   Total Bilirubin 0.6  0.3 - 1.2 mg/dL   GFR calc non Af Amer >90  >90 mL/min   GFR calc Af Amer >90  >90 mL/min  TROPONIN I      Result Value Range   Troponin I <0.30  <0.30 ng/mL  URINALYSIS, ROUTINE W REFLEX MICROSCOPIC      Result  Value Range   Color, Urine YELLOW  YELLOW   APPearance CLEAR  CLEAR   Specific Gravity, Urine 1.017  1.005 - 1.030   pH 8.5 (*) 5.0 - 8.0   Glucose, UA NEGATIVE  NEGATIVE mg/dL   Hgb urine dipstick NEGATIVE  NEGATIVE   Bilirubin Urine NEGATIVE  NEGATIVE   Ketones, ur NEGATIVE  NEGATIVE mg/dL   Protein, ur NEGATIVE  NEGATIVE mg/dL   Urobilinogen, UA 1.0  0.0 - 1.0 mg/dL   Nitrite NEGATIVE  NEGATIVE   Leukocytes, UA NEGATIVE  NEGATIVE  URINE RAPID DRUG SCREEN (HOSP PERFORMED)      Result Value Range   Opiates NONE DETECTED  NONE DETECTED   Cocaine NONE DETECTED  NONE DETECTED   Benzodiazepines NONE DETECTED  NONE DETECTED   Amphetamines NONE DETECTED  NONE DETECTED   Tetrahydrocannabinol NONE DETECTED  NONE DETECTED   Barbiturates NONE DETECTED  NONE DETECTED  ETHANOL      Result Value Range   Alcohol, Ethyl (B) <11  0 - 11 mg/dL   Dg Chest 2 View  09/26/8117  *RADIOLOGY REPORT*  Clinical Data: Chest abdominal pain.  Cough.  CHEST - 2 VIEW  Comparison: 08/15/2012  Findings: Subsegmental atelectasis is seen in the right mid lung. No evidence of pulmonary air space disease or edema.  No evidence of pleural  effusion.  Heart size is within normal limits for low lung volumes.  No mass or lymphadenopathy identified.  IMPRESSION: Low lung volumes with mild right midlung subsegmental atelectasis. No evidence of pulmonary consolidation or edema.   Original Report Authenticated By: Myles Rosenthal, M.D.       Date: 08/31/2012  Rate: 88  Rhythm: normal sinus rhythm  QRS Axis: normal  Intervals: normal  ST/T Wave abnormalities: nonspecific ST/T changes  Conduction Disutrbances:none  Narrative Interpretation:   Old EKG Reviewed: unchanged   No diagnosis found.    MDM  Pt with chest pain that is reproducible and lower ext pain.  I have a low suspicion of ACS, but will check troponin, i-stat.  Pt given pain medication for her lower ext pain. Her pain seems chronic and unchanged in nature. I suspect that she will be able to be discharged and can followup with her primary care physician pending labs.  Perrin Maltese to f/u on labs.        Rolan Bucco, MD 08/31/12 2024

## 2012-08-31 NOTE — ED Notes (Signed)
[facility] called for transport.  

## 2012-08-31 NOTE — ED Notes (Signed)
Patient transported to X-ray 

## 2012-08-31 NOTE — ED Notes (Signed)
Per EMS: Pt's home health nurse called for pt to be brought to ED for evaluation of leg weakness. Pt c/o leg weakness x 2 months. PT AO x4. 132 pal. 88 bpm. 83 BG.

## 2012-08-31 NOTE — ED Notes (Signed)
Patient is resting comfortably.  Patient is sleeping.  Patient brought to POD C to wait for [facility] to come and transport her to [facility].

## 2012-08-31 NOTE — ED Provider Notes (Signed)
[name],Jennifer S 8:00 PM patient discussed in sign out with Dr. Fredderick Phenix. Patient has had a recent multiple visits with prior similar symptoms. She has atypical chest pain reproducible seemingly muscle skeletal in nature. EKG without acute signs of ACS or any changes from previous. Troponin and i-STAT came 8 are pending.  Nurse reports that patient has refused any blood testing. I spoke with the patient who wishes to return home. She does not wish to have any blood tests including a troponin to further evaluate any cardiac symptoms. She understands the risks of refusing this test. Vital signs unremarkable. Patient appears stable she will be discharged AMA. She understands she may return at anytime for further evaluation and she'll encouraged to followup with her PCP and home health.    Angus Seller, PA-C 08/31/12 2035

## 2012-08-31 NOTE — ED Notes (Signed)
[facility] transported patient back home.  Fiance was notified and he is at home waiting for patient to arrive.

## 2012-08-31 NOTE — ED Notes (Signed)
Pt states "I want to skip the blood tests and go home." PA Peter at bedside. Explained consequences of leaving AMA. Pt agreed. VSS.

## 2012-09-01 ENCOUNTER — Inpatient Hospital Stay (HOSPITAL_COMMUNITY)
Admission: EM | Admit: 2012-09-01 | Discharge: 2012-09-05 | DRG: 189 | Disposition: A | Discharge status: Home Health | Payer: Medicare Other | Attending: Cardiovascular Disease | Admitting: Cardiovascular Disease

## 2012-09-01 ENCOUNTER — Encounter (HOSPITAL_COMMUNITY): Payer: Self-pay | Admitting: Emergency Medicine

## 2012-09-01 ENCOUNTER — Emergency Department (HOSPITAL_COMMUNITY): Payer: Medicare Other

## 2012-09-01 DIAGNOSIS — E872 Acidosis, unspecified: Secondary | ICD-10-CM | POA: Diagnosis present

## 2012-09-01 DIAGNOSIS — K219 Gastro-esophageal reflux disease without esophagitis: Secondary | ICD-10-CM | POA: Diagnosis present

## 2012-09-01 DIAGNOSIS — Z9119 Patient's noncompliance with other medical treatment and regimen: Secondary | ICD-10-CM

## 2012-09-01 DIAGNOSIS — E162 Hypoglycemia, unspecified: Secondary | ICD-10-CM | POA: Diagnosis not present

## 2012-09-01 DIAGNOSIS — Z79899 Other long term (current) drug therapy: Secondary | ICD-10-CM

## 2012-09-01 DIAGNOSIS — F259 Schizoaffective disorder, unspecified: Secondary | ICD-10-CM | POA: Diagnosis present

## 2012-09-01 DIAGNOSIS — R079 Chest pain, unspecified: Secondary | ICD-10-CM

## 2012-09-01 DIAGNOSIS — J9601 Acute respiratory failure with hypoxia: Secondary | ICD-10-CM

## 2012-09-01 DIAGNOSIS — Z91199 Patient's noncompliance with other medical treatment and regimen due to unspecified reason: Secondary | ICD-10-CM

## 2012-09-01 DIAGNOSIS — G4733 Obstructive sleep apnea (adult) (pediatric): Secondary | ICD-10-CM

## 2012-09-01 DIAGNOSIS — I10 Essential (primary) hypertension: Secondary | ICD-10-CM | POA: Diagnosis present

## 2012-09-01 DIAGNOSIS — Z7982 Long term (current) use of aspirin: Secondary | ICD-10-CM

## 2012-09-01 DIAGNOSIS — E7800 Pure hypercholesterolemia, unspecified: Secondary | ICD-10-CM | POA: Diagnosis present

## 2012-09-01 DIAGNOSIS — F17200 Nicotine dependence, unspecified, uncomplicated: Secondary | ICD-10-CM | POA: Diagnosis present

## 2012-09-01 DIAGNOSIS — J9612 Chronic respiratory failure with hypercapnia: Secondary | ICD-10-CM | POA: Diagnosis present

## 2012-09-01 DIAGNOSIS — F250 Schizoaffective disorder, bipolar type: Secondary | ICD-10-CM

## 2012-09-01 DIAGNOSIS — Z6841 Body Mass Index (BMI) 40.0 and over, adult: Secondary | ICD-10-CM

## 2012-09-01 DIAGNOSIS — J449 Chronic obstructive pulmonary disease, unspecified: Secondary | ICD-10-CM | POA: Diagnosis present

## 2012-09-01 DIAGNOSIS — R4182 Altered mental status, unspecified: Secondary | ICD-10-CM

## 2012-09-01 DIAGNOSIS — Z8249 Family history of ischemic heart disease and other diseases of the circulatory system: Secondary | ICD-10-CM

## 2012-09-01 DIAGNOSIS — J9600 Acute respiratory failure, unspecified whether with hypoxia or hypercapnia: Secondary | ICD-10-CM

## 2012-09-01 DIAGNOSIS — I959 Hypotension, unspecified: Secondary | ICD-10-CM | POA: Diagnosis present

## 2012-09-01 DIAGNOSIS — J4489 Other specified chronic obstructive pulmonary disease: Secondary | ICD-10-CM | POA: Diagnosis present

## 2012-09-01 DIAGNOSIS — Z888 Allergy status to other drugs, medicaments and biological substances status: Secondary | ICD-10-CM

## 2012-09-01 DIAGNOSIS — G4700 Insomnia, unspecified: Secondary | ICD-10-CM

## 2012-09-01 DIAGNOSIS — G40909 Epilepsy, unspecified, not intractable, without status epilepticus: Secondary | ICD-10-CM | POA: Diagnosis present

## 2012-09-01 DIAGNOSIS — I4892 Unspecified atrial flutter: Secondary | ICD-10-CM | POA: Diagnosis present

## 2012-09-01 DIAGNOSIS — Z880 Allergy status to penicillin: Secondary | ICD-10-CM

## 2012-09-01 DIAGNOSIS — E039 Hypothyroidism, unspecified: Secondary | ICD-10-CM | POA: Diagnosis present

## 2012-09-01 DIAGNOSIS — J9620 Acute and chronic respiratory failure, unspecified whether with hypoxia or hypercapnia: Principal | ICD-10-CM | POA: Diagnosis present

## 2012-09-01 DIAGNOSIS — G8929 Other chronic pain: Secondary | ICD-10-CM | POA: Diagnosis present

## 2012-09-01 DIAGNOSIS — E662 Morbid (severe) obesity with alveolar hypoventilation: Secondary | ICD-10-CM | POA: Diagnosis present

## 2012-09-01 LAB — BLOOD GAS, ARTERIAL
Bicarbonate: 29.2 mEq/L — ABNORMAL HIGH (ref 20.0–24.0)
Expiratory PAP: 8
Inspiratory PAP: 14
O2 Saturation: 94.4 %
Patient temperature: 98.6
Pressure support: 7 cmH2O
RATE: 16 resp/min
pH, Arterial: 7.25 — ABNORMAL LOW (ref 7.350–7.450)
pO2, Arterial: 80.4 mmHg (ref 80.0–100.0)

## 2012-09-01 LAB — LACTIC ACID, PLASMA: Lactic Acid, Venous: 0.7 mmol/L (ref 0.5–2.2)

## 2012-09-01 LAB — POCT I-STAT 3, ART BLOOD GAS (G3+)
pCO2 arterial: 72.3 mmHg (ref 35.0–45.0)
pH, Arterial: 7.263 — ABNORMAL LOW (ref 7.350–7.450)

## 2012-09-01 LAB — COMPREHENSIVE METABOLIC PANEL
Albumin: 2.6 g/dL — ABNORMAL LOW (ref 3.5–5.2)
BUN: 15 mg/dL (ref 6–23)
Chloride: 100 mEq/L (ref 96–112)
Creatinine, Ser: 0.94 mg/dL (ref 0.50–1.10)
Total Bilirubin: 0.4 mg/dL (ref 0.3–1.2)

## 2012-09-01 LAB — POCT I-STAT TROPONIN I: Troponin i, poc: 0.01 ng/mL (ref 0.00–0.08)

## 2012-09-01 LAB — CBC
HCT: 40.6 % (ref 36.0–46.0)
MCHC: 33.3 g/dL (ref 30.0–36.0)
MCV: 100.2 fL — ABNORMAL HIGH (ref 78.0–100.0)
RDW: 15.8 % — ABNORMAL HIGH (ref 11.5–15.5)
WBC: 5.8 10*3/uL (ref 4.0–10.5)

## 2012-09-01 LAB — MRSA PCR SCREENING: MRSA by PCR: POSITIVE — AB

## 2012-09-01 MED ORDER — SODIUM CHLORIDE 0.9 % IJ SOLN
3.0000 mL | Freq: Two times a day (BID) | INTRAMUSCULAR | Status: DC
  Administered 2012-09-01: 3 mL via INTRAVENOUS

## 2012-09-01 MED ORDER — HEPARIN SODIUM (PORCINE) 5000 UNIT/ML IJ SOLN
5000.0000 [IU] | Freq: Three times a day (TID) | INTRAMUSCULAR | Status: DC
  Administered 2012-09-01 – 2012-09-05 (×13): 5000 [IU] via SUBCUTANEOUS
  Filled 2012-09-01 (×15): qty 1

## 2012-09-01 MED ORDER — DIVALPROEX SODIUM ER 500 MG PO TB24
1000.0000 mg | ORAL_TABLET | Freq: Two times a day (BID) | ORAL | Status: DC
  Administered 2012-09-01 (×2): 1000 mg via ORAL
  Filled 2012-09-01 (×4): qty 2

## 2012-09-01 MED ORDER — PALIPERIDONE ER 6 MG PO TB24
6.0000 mg | ORAL_TABLET | Freq: Two times a day (BID) | ORAL | Status: DC
  Administered 2012-09-01 (×2): 6 mg via ORAL
  Filled 2012-09-01 (×5): qty 1

## 2012-09-01 MED ORDER — FLUTICASONE PROPIONATE 50 MCG/ACT NA SUSP: 2.0000 | Freq: Every day | NASAL | Status: DC | PRN

## 2012-09-01 MED ORDER — POLYETHYLENE GLYCOL 3350 17 G PO PACK
17.0000 g | PACK | ORAL | Status: DC
  Filled 2012-09-01: qty 1

## 2012-09-01 MED ORDER — BIOTENE DRY MOUTH MT LIQD
15.0000 mL | Freq: Two times a day (BID) | OROMUCOSAL | Status: DC
  Administered 2012-09-01 – 2012-09-02 (×3): 15 mL via OROMUCOSAL

## 2012-09-01 MED ORDER — POLYETHYLENE GLYCOL 3350 17 G PO PACK
17.0000 g | PACK | ORAL | Status: DC
  Administered 2012-09-03: 17 g via ORAL
  Filled 2012-09-01: qty 1

## 2012-09-01 MED ORDER — SODIUM CHLORIDE 0.9 % IV BOLUS (SEPSIS)
250.0000 mL | Freq: Once | INTRAVENOUS | Status: AC
Start: 2012-09-01 — End: 2012-09-01
  Administered 2012-09-01: 250 mL via INTRAVENOUS

## 2012-09-01 MED ORDER — SODIUM CHLORIDE 0.9 % IV BOLUS (SEPSIS)
500.0000 mL | Freq: Once | INTRAVENOUS | Status: AC
  Administered 2012-09-01: 500 mL via INTRAVENOUS

## 2012-09-01 MED ORDER — ASPIRIN EC 81 MG PO TBEC
81.0000 mg | DELAYED_RELEASE_TABLET | Freq: Every morning | ORAL | Status: DC
  Administered 2012-09-01 – 2012-09-05 (×3): 81 mg via ORAL
  Filled 2012-09-01 (×5): qty 1

## 2012-09-01 MED ORDER — NITROGLYCERIN 0.4 MG SL SUBL: 0.4000 mg | SUBLINGUAL_TABLET | SUBLINGUAL | Status: DC | PRN

## 2012-09-01 MED ORDER — METOPROLOL SUCCINATE ER 50 MG PO TB24
50.0000 mg | ORAL_TABLET | Freq: Every day | ORAL | Status: DC
  Administered 2012-09-01: 50 mg via ORAL
  Filled 2012-09-01 (×2): qty 1

## 2012-09-01 MED ORDER — METOCLOPRAMIDE HCL 5 MG/ML IJ SOLN: 10.0000 mg | Freq: Once | INTRAMUSCULAR | Status: DC

## 2012-09-01 MED ORDER — PANTOPRAZOLE SODIUM 40 MG PO TBEC
40.0000 mg | DELAYED_RELEASE_TABLET | Freq: Every day | ORAL | Status: DC
  Administered 2012-09-01 – 2012-09-03 (×3): 40 mg via ORAL
  Filled 2012-09-01 (×3): qty 1

## 2012-09-01 MED ORDER — LEVOTHYROXINE SODIUM 150 MCG PO TABS
150.0000 ug | ORAL_TABLET | Freq: Every day | ORAL | Status: DC
  Administered 2012-09-02 – 2012-09-05 (×4): 150 ug via ORAL
  Filled 2012-09-01 (×8): qty 1

## 2012-09-01 MED ORDER — ADULT MULTIVITAMIN W/MINERALS CH
1.0000 | ORAL_TABLET | Freq: Every morning | ORAL | Status: DC
  Administered 2012-09-02 – 2012-09-05 (×4): 1 via ORAL
  Filled 2012-09-01 (×7): qty 1

## 2012-09-01 MED ORDER — THEOPHYLLINE ER 100 MG PO TB12
100.0000 mg | ORAL_TABLET | Freq: Two times a day (BID) | ORAL | Status: DC
  Administered 2012-09-01 (×2): 100 mg via ORAL
  Filled 2012-09-01 (×4): qty 1

## 2012-09-01 MED ORDER — ALBUTEROL SULFATE HFA 108 (90 BASE) MCG/ACT IN AERS: 2.0000 | INHALATION_SPRAY | Freq: Four times a day (QID) | RESPIRATORY_TRACT | Status: DC | PRN

## 2012-09-01 MED ORDER — ONDANSETRON HCL 4 MG/2ML IJ SOLN: 4.0000 mg | Freq: Three times a day (TID) | INTRAMUSCULAR | Status: DC | PRN

## 2012-09-01 MED ORDER — SODIUM CHLORIDE 0.9 % IJ SOLN: 3.0000 mL | INTRAMUSCULAR | Status: DC | PRN

## 2012-09-01 MED ORDER — SODIUM CHLORIDE 0.9 % IJ SOLN
3.0000 mL | Freq: Two times a day (BID) | INTRAMUSCULAR | Status: DC
  Administered 2012-09-01 – 2012-09-04 (×5): 3 mL via INTRAVENOUS

## 2012-09-01 MED ORDER — SODIUM CHLORIDE 0.9 % IV SOLN
INTRAVENOUS | Status: DC
  Administered 2012-09-01 – 2012-09-02 (×2): via INTRAVENOUS

## 2012-09-01 MED ORDER — SODIUM CHLORIDE 0.9 % IV BOLUS (SEPSIS)
250.0000 mL | Freq: Once | INTRAVENOUS | Status: AC
  Administered 2012-09-01: 250 mL via INTRAVENOUS

## 2012-09-01 MED ORDER — SODIUM CHLORIDE 0.9 % IV SOLN: 250.0000 mL | INTRAVENOUS | Status: DC | PRN

## 2012-09-01 MED ORDER — BUDESONIDE 0.25 MG/2ML IN SUSP
0.2500 mg | Freq: Two times a day (BID) | RESPIRATORY_TRACT | Status: DC
  Administered 2012-09-01 – 2012-09-05 (×9): 0.25 mg via RESPIRATORY_TRACT
  Filled 2012-09-01 (×11): qty 2

## 2012-09-01 NOTE — ED Notes (Signed)
Patient denies any pain at this time, including chest pain.  Patient denies any symptoms.  Requesting something to eat at this time; patient informed that we need to wait for EDP okay before patient can have anything to eat or drink; patient verbalized understanding.

## 2012-09-01 NOTE — Progress Notes (Signed)
Subjective:  Awake. Asking for food. Blood pressure improving after fluid boluses. O2 sat 98 to 100 % with RR-16 to 20. Refuses ABG.  Objective:  Vital Signs in the last 24 hours: Temp:  [97.7 F (36.5 C)-98.2 F (36.8 C)] 97.7 F (36.5 C) (03/15 1649) Pulse Rate:  [69-96] 69 (03/15 1947) Cardiac Rhythm:  [-] Normal sinus rhythm (03/15 1006) Resp:  [10-21] 17 (03/15 1947) BP: (72-108)/(27-82) 94/46 mmHg (03/15 1947) SpO2:  [92 %-100 %] 98 % (03/15 1947) FiO2 (%):  [30 %-40 %] 30 % (03/15 1947) Weight:  [129.5 kg (285 lb 7.9 oz)] 129.5 kg (285 lb 7.9 oz) (03/15 1100)  Physical Exam: BP Readings from Last 1 Encounters:  09/01/12 94/46     Wt Readings from Last 1 Encounters:  09/01/12 129.5 kg (285 lb 7.9 oz)    Weight change:   HEENT: [place]/AT, Eyes-Brown, PERL, EOMI, Conjunctiva-Pink, Sclera-Non-icteric Mask applied. Neck: No JVD, No bruit, Trachea midline. Lungs:  Clear, Bilateral. Cardiac:  Regular rhythm, normal S1 and S2, no S3. II/VI systolic murmur Abdomen:  Soft, non-tender. Extremities:  Trace edema present. No cyanosis. No clubbing. CNS: AxOx3, Cranial nerves grossly intact, moves all 4 extremities. Right handed. Skin: Warm and dry.   Intake/Output from previous day:      Lab Results: BMET    Component Value Date/Time   NA 138 09/01/2012 0552   K 4.1 09/01/2012 0552   CL 100 09/01/2012 0552   CO2 31 09/01/2012 0552   GLUCOSE 79 09/01/2012 0552   BUN 15 09/01/2012 0552   CREATININE 0.94 09/01/2012 0552   CALCIUM 10.1 09/01/2012 0552   GFRNONAA 65* 09/01/2012 0552   GFRAA 75* 09/01/2012 0552   CBC    Component Value Date/Time   WBC 5.8 09/01/2012 0552   RBC 4.05 09/01/2012 0552   HGB 13.5 09/01/2012 0552   HCT 40.6 09/01/2012 0552   PLT 100* 09/01/2012 0552   MCV 100.2* 09/01/2012 0552   MCH 33.3 09/01/2012 0552   MCHC 33.3 09/01/2012 0552   RDW 15.8* 09/01/2012 0552   LYMPHSABS 1.8 08/15/2012 1820   MONOABS 0.9 08/15/2012 1820   EOSABS 0.1 08/15/2012 1820   BASOSABS  0.0 08/15/2012 1820   CARDIAC ENZYMES Lab Results  Component Value Date   CKTOTAL 42 06/03/2012   CKMB 1.7 06/03/2012   TROPONINI <0.30 08/15/2012    Scheduled Meds: . antiseptic oral rinse  15 mL Mouth Rinse BID  . aspirin EC  81 mg Oral q morning - 10a  . budesonide  0.25 mg Nebulization BID  . divalproex  1,000 mg Oral BID  . heparin  5,000 Units Subcutaneous Q8H  . [START ON 09/02/2012] levothyroxine  150 mcg Oral QAC breakfast  . metoprolol succinate  50 mg Oral Daily  . multivitamin with minerals  1 tablet Oral q morning - 10a  . paliperidone  6 mg Oral BID  . pantoprazole  40 mg Oral Daily  . [START ON 09/03/2012] polyethylene glycol  17 g Oral Q72H  . sodium chloride  500 mL Intravenous Once  . sodium chloride  3 mL Intravenous Q12H  . theophylline  100 mg Oral Q12H   Continuous Infusions: . sodium chloride 75 mL/hr at 09/01/12 1837   PRN Meds:.sodium chloride, albuterol, fluticasone, nitroGLYCERIN  Assessment/Plan:  Acute respiratory failure.  Hypertension  Paroxysmal atrial flutter  Morbid obesity  Obesity hypoventilation syndrome/obstructive sleep apnea  Hypothyroidism  History of tobacco abuse  Seizure disorder  Schizoaffective disorder  May resume  full liquid diet as tolerated. Postpone ABG for now. Check CBG.   LOS: 0 days    Orpah Cobb  MD  09/01/2012, 8:05 PM

## 2012-09-01 NOTE — Progress Notes (Signed)
Updated Dr. Algie Coffer about pt being placed back on PRN BIPAP d/t increased lethargy.  Pt arousable and oriented, but still lethargic after 1hour or reinitiating BIPAP.  MD says to keep pt on BIPAP as much as possible and will recheck an ABG in the AM.  Will continue to monitor.  Salomon Mast, RN

## 2012-09-01 NOTE — ED Notes (Signed)
Patient was just seen here earlier tonight; [facility] transported patient back to her apartment -- upon [facility] arrival to apartment, patient's caregiver/power of attorney stated that he was unable to care for patient and was unaware that patient was being brought back to the apartment.  Power of Attornery requesting that patient be transported back to ED for nursing home placement.  Patient was recently released from [facility] on Monday -- patient states that she is unable to care for herself, but was "let go" by staff (patient denies checking herself out).  Patient alert and oriented x4; PERRL present.  Upon arrival to room, patient changed into gown.  Will continue to monitor.

## 2012-09-01 NOTE — ED Notes (Addendum)
Patient currently asleep in bed; no respiratory or acute distress noted.  Will continue to monitor.  EDP notified about consistent low blood pressures (systolic 80s-90s).

## 2012-09-01 NOTE — ED Notes (Signed)
ABG collected by respiratory therapist.

## 2012-09-01 NOTE — Progress Notes (Signed)
Hypotension  Primary team aware; receiving NS bolus; on Metoprolol at 50 mg daily; might benefit from holding or decreasing dose.   Defer to primary team.

## 2012-09-01 NOTE — Progress Notes (Signed)
Paged Dr. Algie Coffer regarding pt BP 70s-80s/30s-40s.  Orders received to bolus NS and then have 43mL/hr continuous NS.  Will continue to monitor.  Sallyanne Kuster

## 2012-09-01 NOTE — ED Provider Notes (Signed)
History     CSN: [id number]  Arrival date & time 09/01/12  0000   First MD Initiated Contact with Patient 09/01/12 0334      No chief complaint on file.   (Consider location/radiation/quality/duration/timing/severity/associated sxs/prior treatment) HPI  This patient is a 62 yo F with multiple chronic medical problems who was discharged from this ED earlier in the night and transported back to her house via EMS. EMS brought the patient to door and were met by her BF who told them that he was her power of attorney and that he would  Not accept the patient back into the house because she needed to be admitted to the hospital. Thus, EMS brought patient back to the ED and dropped her off.   The patient is without any acute complaints at this time. She says that both of her legs have been weak for the past 3 months and thus she has been unable to ambulate. She was seen earlier in the ED for chest pain but declined work up and left.   She endorses leg pain at this time and denies chest pain right now. Reports compliance with all meds. The patient is not accompanied by friends or family.    Past Medical History  Diagnosis Date  . Schizoaffective disorder   . Urinary tract infection   . Hypertension   . GERD (gastroesophageal reflux disease)   . Hypothyroidism   . Obesity   . Asthma   . COPD (chronic obstructive pulmonary disease)   . CHF (congestive heart failure)   . Seizures   . Morbid obesity   . Chronic pain   . Hypercholesteremia   . Anginal pain   . Myocardial infarction 2002  . Pneumonia     "several times" (05/23/2012)  . Diabetes mellitus     "I'm not diabetic anymore" (05/23/2012)  . Anemia   . History of blood transfusion 1993    "when I had my hysterectomy" (05/23/2012)  . H/O hiatal hernia   . Daily headache   . Arthritis     "severe; all over my body" (05/23/2012)  . Hypoventilation syndrome     [name] 05/30/2012  . Atrial fibrillation   . Atrial flutter,  paroxysmal     [name] 05/30/2012  . Exertional dyspnea   . Shortness of breath     "all the time lately" (05/30/2012)    Past Surgical History  Procedure Laterality Date  . Vaginal hysterectomy  1993  . Tubal ligation  1998  . Cholecystectomy  ?2008  . Cardiac catheterization  2008    Family History  Problem Relation Age of Onset  . Heart disease Father   . Cancer Mother 2    Breast    History  Substance Use Topics  . Smoking status: Former Smoker -- 2.00 packs/day for 12 years    Types: Cigarettes    Quit date: 06/29/2000  . Smokeless tobacco: Current User    Types: Snuff, Chew     Comment: 05/30/2012 "still have what you gave me last time"  . Alcohol Use: No    OB History   Grav Para Term Preterm Abortions TAB SAB Ect Mult Living                  Review of Systems Gen: Fatigue, generalized weakness Eyes: no discharge or drainage, no occular pain or visual changes Nose: no epistaxis or rhinorrhea Mouth: no dental pain, no sore throat Neck: no neck pain Lungs: no SOB,  cough, wheezing CV: no chest pain, palpitations, dependent edema or orthopnea Abd: no abdominal pain, nausea, vomiting GU: no dysuria or gross hematuria MSK: Weakness in both legs which is diffuse. Neuro: no headache, no focal neurologic deficits Skin: no rash Psyche: negative.  Allergies  Food; Penicillins; and Sulfamethoxazole w-trimethoprim  Home Medications   Current Outpatient Rx  Name  Route  Sig  Dispense  Refill  . albuterol (PROVENTIL HFA;VENTOLIN HFA) 108 (90 BASE) MCG/ACT inhaler   Inhalation   Inhale 2 puffs into the lungs every 6 (six) hours as needed for wheezing.         Marland Kitchen aspirin EC 81 MG tablet   Oral   Take 81 mg by mouth every morning.          . budesonide (PULMICORT) 0.25 MG/2ML nebulizer solution   Nebulization   Take 0.25 mg by nebulization 2 (two) times daily.         . chlorproMAZINE (THORAZINE) 50 MG tablet   Oral   Take 50-200 mg by mouth 3  (three) times daily - between meals and at bedtime. 50mg  twice a day and 200mg  at bedtime         . divalproex (DEPAKOTE ER) 500 MG 24 hr tablet   Oral   Take 1,000 mg by mouth 2 (two) times daily.          . fluticasone (FLONASE) 50 MCG/ACT nasal spray   Nasal   Place 2 sprays into the nose daily as needed. For nasal congestion         . levothyroxine (SYNTHROID, LEVOTHROID) 150 MCG tablet   Oral   Take 150 mcg by mouth every morning. For thyroid disease.         . metoprolol succinate (TOPROL-XL) 50 MG 24 hr tablet   Oral   Take 50 mg by mouth daily. Take with or immediately following a meal.         . Multiple Vitamin (MULTIVITAMIN WITH MINERALS) TABS   Oral   Take 1 tablet by mouth every morning. For nutritional supplement.         . nitroGLYCERIN (NITROSTAT) 0.4 MG SL tablet   Sublingual   Place 0.4 mg under the tongue every 5 (five) minutes as needed. For chest pain         . omeprazole (PRILOSEC) 20 MG capsule   Oral   Take 20 mg by mouth every morning.          . paliperidone ([name]) 6 MG 24 hr tablet   Oral   Take 6 mg by mouth 2 (two) times daily.         . polyethylene glycol (MIRALAX / GLYCOLAX) packet   Oral   Take 17 g by mouth every 3 (three) days.          . traMADol (ULTRAM) 50 MG tablet   Oral   Take 50 mg by mouth 4 (four) times daily.            BP 90/50  Pulse 92  Temp(Src) 98.1 F (36.7 C) (Oral)  Resp 18  SpO2 95%  Physical Exam Gen: chronically ill appearing and obese Head: NCAT Eyes: PERL, EOMI Nose: no epistaixis or rhinorrhea Mouth/throat: mucosa is dehydrated appearing, patient is edentulous Neck: supple, no stridor, no jvd Lungs: CTA B, no wheezing, rhonchi or rales, diminished BS bilaterally but poor inspiratory effort.  Abd: soft, notender, nondistended, morbidly obese Back: no ttp, no cva ttp Skin: no rashese,  wnl Neuro: CN ii-xii grossly intact, no focal deficits.  Initially, the patient seemed to be  appropriately alert. However, on re-exam she was hypersomnolent but difficult to arouse.  Psyche; flat affect,  calm and cooperative.   ED Course  Procedures (including critical care time)  Results for orders placed during the hospital encounter of 09/01/12 (from the past 24 hour(s))  CBC     Status: Abnormal (Preliminary result)   Collection Time    09/01/12  5:52 AM      Result Value Range   WBC 5.8  4.0 - 10.5 K/uL   RBC 4.05  3.87 - 5.11 MIL/uL   Hemoglobin 13.5  12.0 - 15.0 g/dL   HCT 16.1  09.6 - 04.5 %   MCV 100.2 (*) 78.0 - 100.0 fL   MCH 33.3  26.0 - 34.0 pg   MCHC 33.3  30.0 - 36.0 g/dL   RDW 40.9 (*) 81.1 - 91.4 %   Platelets PENDING  150 - 400 K/uL  COMPREHENSIVE METABOLIC PANEL     Status: Abnormal   Collection Time    09/01/12  5:52 AM      Result Value Range   Sodium 138  135 - 145 mEq/L   Potassium 4.1  3.5 - 5.1 mEq/L   Chloride 100  96 - 112 mEq/L   CO2 31  19 - 32 mEq/L   Glucose, Bld 79  70 - 99 mg/dL   BUN 15  6 - 23 mg/dL   Creatinine, Ser 7.82  0.50 - 1.10 mg/dL   Calcium 95.6  8.4 - 21.3 mg/dL   Total Protein 6.0  6.0 - 8.3 g/dL   Albumin 2.6 (*) 3.5 - 5.2 g/dL   AST 31  0 - 37 U/L   ALT 14  0 - 35 U/L   Alkaline Phosphatase 59  39 - 117 U/L   Total Bilirubin 0.4  0.3 - 1.2 mg/dL   GFR calc non Af Amer 65 (*) >90 mL/min   GFR calc Af Amer 75 (*) >90 mL/min  LACTIC ACID, PLASMA     Status: None   Collection Time    09/01/12  6:01 AM      Result Value Range   Lactic Acid, Venous 0.7  0.5 - 2.2 mmol/L  POCT I-STAT 3, BLOOD GAS (G3+)     Status: Abnormal   Collection Time    09/01/12  6:22 AM      Result Value Range   pH, Arterial 7.263 (*) 7.350 - 7.450   pCO2 arterial 72.3 (*) 35.0 - 45.0 mmHg   pO2, Arterial 70.0 (*) 80.0 - 100.0 mmHg   Bicarbonate 32.8 (*) 20.0 - 24.0 mEq/L   TCO2 35  0 - 100 mmol/L   O2 Saturation 91.0     Acid-Base Excess 3.0 (*) 0.0 - 2.0 mmol/L   Patient temperature 98.1 F     Collection site RADIAL, ALLEN'S TEST  ACCEPTABLE     Drawn by Operator     Sample type ARTERIAL     Comment NOTIFIED PHYSICIAN    POCT I-STAT TROPONIN I     Status: None   Collection Time    09/01/12  6:27 AM      Result Value Range   Troponin i, poc 0.01  0.00 - 0.08 ng/mL   Comment 3            Dg Chest 2 View  08/31/2012  *RADIOLOGY REPORT*  Clinical Data: Chest abdominal  pain.  Cough.  CHEST - 2 VIEW  Comparison: 08/15/2012  Findings: Subsegmental atelectasis is seen in the right mid lung. No evidence of pulmonary air space disease or edema.  No evidence of pleural effusion.  Heart size is within normal limits for low lung volumes.  No mass or lymphadenopathy identified.  IMPRESSION: Low lung volumes with mild right midlung subsegmental atelectasis. No evidence of pulmonary consolidation or edema.   Original Report Authenticated By: Myles Rosenthal, M.D.    CRITICAL CARE Performed by: Brandt Loosen   Total critical care time: 35  Critical care time was exclusive of separately billable procedures and treating other patients.  Critical care was necessary to treat or prevent imminent or life-threatening deterioration.  Critical care was time spent personally by me on the following activities: development of treatment plan with patient and/or surrogate as well as nursing, discussions with consultants, evaluation of patient's response to treatment, examination of patient, obtaining history from patient or surrogate, ordering and performing treatments and interventions, ordering and review of laboratory studies, ordering and review of radiographic studies, pulse oximetry and re-evaluation of patient's condition.    MDM  Patient has acute on chronic respiratory failure with respiratory acidosis and co2 narcosis. We are treating with BiPAP. She will also need SW consultation for ultimate placement - likely in SNF. I have paged GM to request admission.         Brandt Loosen, MD 09/01/12 [phone]

## 2012-09-01 NOTE — ED Notes (Signed)
Patient currently asleep in bed; no respiratory or acute distress noted.  Patient is pending EDP exam at this time; awaiting further orders from EDP.  Informed EDP that patient has intravenous line and that bolus of 500 mL fluid has been given at this time due to low pressure.  Will continue to monitor.

## 2012-09-01 NOTE — Progress Notes (Signed)
Notified by Dr. Frederico Hamman regarding pt SBP in the 60s.  bolus ordered.  MD also to send NP in to assess the pt.  Will continue to monitor.  Salomon Mast, RN

## 2012-09-01 NOTE — Progress Notes (Signed)
ABG reviewed   Slightly improved acute on chronic hypercarbia; on NIPPV;

## 2012-09-01 NOTE — ED Notes (Signed)
Patient was just discharged home (apartment complex); transported home by [facility].  Patient used to stay at [facility] (patient states that she checked herself out of [facility]; unknown when patient stayed here).  Power of attorney met patient upon arrival home and was unaware that patient was returning home -- patient is unable to care for herself due to conditions and obesity; power of attorney unable to take care of patient.  Power of attorney stated that she needed to come back to ED because he is unable to care for her.  Patient denies any pain or any complaints at this time.  Was previously seen here for atypical chest pain and lower extremity pain.  Power of Attorney -- [name] -- [phone].

## 2012-09-01 NOTE — ED Provider Notes (Signed)
Medical screening examination/treatment/procedure(s) were performed by non-physician practitioner and as supervising physician I was immediately available for consultation/collaboration.  Anthony T Allen, MD 09/01/12 1639 

## 2012-09-01 NOTE — ED Notes (Addendum)
Patient currently resting quietly in bed; no respiratory or acute distress noted.  Patient requesting soda; informed patient that we need to wait until EDP has put in orders before patient can have anything to eat or drink.  Patient verbalized understanding.  Awaiting further orders from EDP; notified EDP again of patient's low blood pressure readings.  Will continue to monitor.

## 2012-09-01 NOTE — ED Notes (Signed)
Patient currently sitting up in bed; no respiratory or acute distress noted.  Patient updated on plan of care; informed patient that EDP will be in to see patient shortly (EDP has been tied up with a critical patient).  Patient denies any other needs at this time; will continue to monitor.

## 2012-09-01 NOTE — H&P (Signed)
[name] is an 62 y.o. female.   Chief Complaint: Progressive increasing shortness of breath   HPI: Patient is 62 year old female with past medical history significant for multiple medical problems i.e. hypertension, history of paroxysmal atrial flutter, COPD, obstructive sleep apnea/obesity hypoventilation syndrome, hypothyroidism, tobacco abuse, morbid obesity, history of hiatus hernia, seizure disorder, history of schizoaffective disorder, came to the ER complaining of progressive increasing shortness of breath. No fever or cough.   Past Medical History  Diagnosis Date  . Schizoaffective disorder   . Urinary tract infection   . Hypertension   . GERD (gastroesophageal reflux disease)   . Hypothyroidism   . Obesity   . Asthma   . COPD (chronic obstructive pulmonary disease)   . CHF (congestive heart failure)   . Seizures   . Morbid obesity   . Chronic pain   . Hypercholesteremia   . Anginal pain   . Myocardial infarction 2002  . Pneumonia     "several times" (05/23/2012)  . Diabetes mellitus     "I'm not diabetic anymore" (05/23/2012)  . Anemia   . History of blood transfusion 1993    "when I had my hysterectomy" (05/23/2012)  . H/O hiatal hernia   . Daily headache   . Arthritis     "severe; all over my body" (05/23/2012)  . Hypoventilation syndrome     [name] 05/30/2012  . Atrial fibrillation   . Atrial flutter, paroxysmal     [name] 05/30/2012  . Exertional dyspnea   . Shortness of breath     "all the time lately" (05/30/2012)      Past Surgical History  Procedure Laterality Date  . Vaginal hysterectomy  1993  . Tubal ligation  1998  . Cholecystectomy  ?2008  . Cardiac catheterization  2008    Family History  Problem Relation Age of Onset  . Heart disease Father   . Cancer Mother 53    Breast   Social History:  reports that she quit smoking about 12 years ago. Her smoking use included Cigarettes. She has a 24 pack-year smoking history. Her smokeless  tobacco use includes Snuff and Chew. She reports that she does not drink alcohol or use illicit drugs.  Allergies:  Allergies  Allergen Reactions  . Food Rash    "lasagna"= rash from the noodles  . Penicillins Hives  . Sulfamethoxazole W-Trimethoprim Hives and Nausea And Vomiting     (Not in a hospital admission)  Results for orders placed during the hospital encounter of 09/01/12 (from the past 48 hour(s))  CBC     Status: Abnormal   Collection Time    09/01/12  5:52 AM      Result Value Range   WBC 5.8  4.0 - 10.5 K/uL   RBC 4.05  3.87 - 5.11 MIL/uL   Hemoglobin 13.5  12.0 - 15.0 g/dL   HCT 11.9  14.7 - 82.9 %   MCV 100.2 (*) 78.0 - 100.0 fL   MCH 33.3  26.0 - 34.0 pg   MCHC 33.3  30.0 - 36.0 g/dL   RDW 56.2 (*) 13.0 - 86.5 %   Platelets 100 (*) 150 - 400 K/uL   Comment: PLATELET COUNT CONFIRMED BY SMEAR  COMPREHENSIVE METABOLIC PANEL     Status: Abnormal   Collection Time    09/01/12  5:52 AM      Result Value Range   Sodium 138  135 - 145 mEq/L   Potassium 4.1  3.5 - 5.1  mEq/L   Chloride 100  96 - 112 mEq/L   CO2 31  19 - 32 mEq/L   Glucose, Bld 79  70 - 99 mg/dL   BUN 15  6 - 23 mg/dL   Creatinine, Ser 4.09  0.50 - 1.10 mg/dL   Calcium 81.1  8.4 - 91.4 mg/dL   Total Protein 6.0  6.0 - 8.3 g/dL   Albumin 2.6 (*) 3.5 - 5.2 g/dL   AST 31  0 - 37 U/L   ALT 14  0 - 35 U/L   Alkaline Phosphatase 59  39 - 117 U/L   Total Bilirubin 0.4  0.3 - 1.2 mg/dL   GFR calc non Af Amer 65 (*) >90 mL/min   GFR calc Af Amer 75 (*) >90 mL/min   Comment:            The eGFR has been calculated     using the CKD EPI equation.     This calculation has not been     validated in all clinical     situations.     eGFR's persistently     <90 mL/min signify     possible Chronic Kidney Disease.  LACTIC ACID, PLASMA     Status: None   Collection Time    09/01/12  6:01 AM      Result Value Range   Lactic Acid, Venous 0.7  0.5 - 2.2 mmol/L  POCT I-STAT 3, BLOOD GAS (G3+)     Status:  Abnormal   Collection Time    09/01/12  6:22 AM      Result Value Range   pH, Arterial 7.263 (*) 7.350 - 7.450   pCO2 arterial 72.3 (*) 35.0 - 45.0 mmHg   pO2, Arterial 70.0 (*) 80.0 - 100.0 mmHg   Bicarbonate 32.8 (*) 20.0 - 24.0 mEq/L   TCO2 35  0 - 100 mmol/L   O2 Saturation 91.0     Acid-Base Excess 3.0 (*) 0.0 - 2.0 mmol/L   Patient temperature 98.1 F     Collection site RADIAL, ALLEN'S TEST ACCEPTABLE     Drawn by Operator     Sample type ARTERIAL     Comment NOTIFIED PHYSICIAN    POCT I-STAT TROPONIN I     Status: None   Collection Time    09/01/12  6:27 AM      Result Value Range   Troponin i, poc 0.01  0.00 - 0.08 ng/mL   Comment 3            Comment: Due to the release kinetics of cTnI,     a negative result within the first hours     of the onset of symptoms does not rule out     myocardial infarction with certainty.     If myocardial infarction is still suspected,     repeat the test at appropriate intervals.   Dg Chest 2 View  08/31/2012  *RADIOLOGY REPORT*  Clinical Data: Chest abdominal pain.  Cough.  CHEST - 2 VIEW  Comparison: 08/15/2012  Findings: Subsegmental atelectasis is seen in the right mid lung. No evidence of pulmonary air space disease or edema.  No evidence of pleural effusion.  Heart size is within normal limits for low lung volumes.  No mass or lymphadenopathy identified.  IMPRESSION: Low lung volumes with mild right midlung subsegmental atelectasis. No evidence of pulmonary consolidation or edema.   Original Report Authenticated By: Myles Rosenthal, M.D.    Dg Chest  Port 1 View  09/01/2012  *RADIOLOGY REPORT*  Clinical Data: Hypotension.  PORTABLE CHEST - 1 VIEW  Comparison: 08/31/2012  Findings: Shallow inspiration.  Normal heart size and pulmonary vascularity.  Linear atelectasis in the right mid lung and both lung bases.  No blunting of costophrenic angles.  No pneumothorax. Mediastinal contours appear intact.  Degenerative changes in the shoulders.   IMPRESSION: Shallow inspiration.  Linear atelectasis in the right mid lung and both lung bases.   Original Report Authenticated By: Burman Nieves, M.D.     @ROS @ Constitutional: Positive for chills and malaise/fatigue. Negative for fever.  Respiratory: Positive for cough, sputum production and shortness of breath.  Cardiovascular: Positive for chest pain and leg swelling. Negative for palpitations, orthopnea and claudication.  Gastrointestinal: Negative for nausea, vomiting and abdominal pain.  Genitourinary: Negative for dysuria and urgency.  Neurological: Positive for dizziness. Negative for headaches.    Blood pressure 85/56, pulse 89, temperature 98.1 F (36.7 C), temperature source Oral, resp. rate 16, SpO2 97.00%. Physical Exam  Constitutional: She is oriented to person, place, and time when awake. She appears well-developed and over-nourished.  HENT: Normocephalic and atraumatic, Brown eyes, Conj-pink, sclera-non-icteric.  Nose: Nose normal.  Mouth/Throat: No oropharyngeal exudate.  Neck: supple. No JVD present. No tracheal deviation present. No thyromegaly present.  Cardiovascular: Sinus rhythm on the monitor,  S1 and S2 soft. There was soft systolic murmur at left lower sternal border no S3 gallop  Respiratory: Decreased breath sound at bases without rhonchi. GI: Soft, distended. Bowel sounds are normal. There is no tenderness.   Musculoskeletal: No clubbing or cyanosis. Trace edema noted  Neurological: She is alert and oriented to person, place, and time.    Assessment/Plan Acute respiratory failure. Hypertension  Paroxysmal atrial flutter  Morbid obesity  Obesity hypoventilation syndrome/obstructive sleep apnea  Hypothyroidism  History of tobacco abuse  Seizure disorder  Schizoaffective disorder  Admit, Bipap, Sleep apnea study.  Primo Innis S 09/01/2012, 9:56 AM

## 2012-09-01 NOTE — ED Notes (Signed)
Phlebotomy at bedside to collect blood samples

## 2012-09-01 NOTE — Progress Notes (Addendum)
CRITICAL VALUE ALERT  Critical value received:  CO2 74  Date of notification:  09/01/2012  Time of notification:  1205  Critical value read back:yes  Nurse who received alert:  Morrie Sheldon RN  MD notified (1st page):  Algie Coffer, MD   Time of first page:  1210  MD notified (2nd page):  Time of second page:  Responding MD:  Algie Coffer, MD  Time MD responded:  1211  Expressed concern that pt has compliance issues with BIPAP.  Told MD that pt is currently on 4L [place] sating 95%.  MD says its ok to leave pt on [place] and change BIPAP order to PRN for decreased mental status and/or decreased O2 levels.  Will continue to monitor.  Salomon Mast, RN

## 2012-09-01 NOTE — ED Notes (Signed)
Pt keeps taking off Bi Pap. sts she wants something to drink. MD aware and respiratory aware. sts will do repeat ABG

## 2012-09-01 NOTE — Progress Notes (Signed)
[facility] History and Physical  [name]:[id number] DOB: 1950-09-30 DOA: 09/01/2012  Referring physician: Dr. Lavella Lemons PCP: [name], MD   HPI: [name] is a 62 y.o. female with past medical history significant for multiple medical problems i.e. hypertension, history of paroxysmal atrial flutter, COPD, obstructive sleep apnea/obesity hypoventilation syndrome, hypothyroidism, tobacco abuse, morbid obesity, history of hiatus hernia, seizure disorder, history of schizoaffective disorder; came to ED with increased SOB and hypersomnolence. Found with elevated CO2 on ABG and mildly hypoxic on RA. Internal Medicine called to evaluate patient for admission.   Past Medical History  Diagnosis Date  . Schizoaffective disorder   . Urinary tract infection   . Hypertension   . GERD (gastroesophageal reflux disease)   . Hypothyroidism   . Obesity   . Asthma   . COPD (chronic obstructive pulmonary disease)   . CHF (congestive heart failure)   . Seizures   . Morbid obesity   . Chronic pain   . Hypercholesteremia   . Anginal pain   . Myocardial infarction 2002  . Pneumonia     "several times" (05/23/2012)  . Diabetes mellitus     "I'm not diabetic anymore" (05/23/2012)  . Anemia   . History of blood transfusion 1993    "when I had my hysterectomy" (05/23/2012)  . H/O hiatal hernia   . Daily headache   . Arthritis     "severe; all over my body" (05/23/2012)  . Hypoventilation syndrome     [name] 05/30/2012  . Atrial fibrillation   . Atrial flutter, paroxysmal     [name] 05/30/2012  . Exertional dyspnea   . Shortness of breath     "all the time lately" (05/30/2012)   Past Surgical History  Procedure Laterality Date  . Vaginal hysterectomy  1993  . Tubal ligation  1998  . Cholecystectomy  ?2008  . Cardiac catheterization  2008   Social History:  reports that she quit smoking about 12 years ago. Her smoking use included Cigarettes. She has a 24 pack-year  smoking history. Her smokeless tobacco use includes Snuff and Chew. She reports that she does not drink alcohol or use illicit drugs.  Allergies  Allergen Reactions  . Food Rash    "lasagna"= rash from the noodles  . Penicillins Hives  . Sulfamethoxazole W-Trimethoprim Hives and Nausea And Vomiting    Family History  Problem Relation Age of Onset  . Heart disease Father   . Cancer Mother 35    Breast    Prior to Admission medications   Medication Sig Start Date End Date Taking? Authorizing Provider  albuterol (PROVENTIL HFA;VENTOLIN HFA) 108 (90 BASE) MCG/ACT inhaler Inhale 2 puffs into the lungs every 6 (six) hours as needed for wheezing.   Yes Historical Provider, MD  aspirin EC 81 MG tablet Take 81 mg by mouth every morning.  05/11/12  Yes Neil Mashburn, PA-C  budesonide (PULMICORT) 0.25 MG/2ML nebulizer solution Take 0.25 mg by nebulization 2 (two) times daily.   Yes Historical Provider, MD  chlorproMAZINE (THORAZINE) 50 MG tablet Take 50-200 mg by mouth 3 (three) times daily - between meals and at bedtime. 50mg  twice a day and 200mg  at bedtime   Yes Historical Provider, MD  divalproex (DEPAKOTE ER) 500 MG 24 hr tablet Take 1,000 mg by mouth 2 (two) times daily.    Yes Historical Provider, MD  fluticasone (FLONASE) 50 MCG/ACT nasal spray Place 2 sprays into the nose daily as needed. For nasal congestion  Yes Historical Provider, MD  levothyroxine (SYNTHROID, LEVOTHROID) 150 MCG tablet Take 150 mcg by mouth every morning. For thyroid disease. 05/11/12  Yes Verne Spurr, PA-C  metoprolol succinate (TOPROL-XL) 50 MG 24 hr tablet Take 50 mg by mouth daily. Take with or immediately following a meal.   Yes Historical Provider, MD  Multiple Vitamin (MULTIVITAMIN WITH MINERALS) TABS Take 1 tablet by mouth every morning. For nutritional supplement. 05/11/12  Yes Verne Spurr, PA-C  nitroGLYCERIN (NITROSTAT) 0.4 MG SL tablet Place 0.4 mg under the tongue every 5 (five) minutes as needed. For  chest pain   Yes Historical Provider, MD  omeprazole (PRILOSEC) 20 MG capsule Take 20 mg by mouth every morning.  05/11/12  Yes Verne Spurr, PA-C  paliperidone ([name]) 6 MG 24 hr tablet Take 6 mg by mouth 2 (two) times daily.   Yes Historical Provider, MD  polyethylene glycol (MIRALAX / GLYCOLAX) packet Take 17 g by mouth every 3 (three) days.    Yes Historical Provider, MD  traMADol (ULTRAM) 50 MG tablet Take 50 mg by mouth 4 (four) times daily.    Yes Historical Provider, MD   Physical Exam: Filed Vitals:   09/01/12 0640 09/01/12 0700 09/01/12 0735 09/01/12 0921  BP: 87/56 91/60 88/55  85/56  Pulse:  89 92 89  Temp:      TempSrc:      Resp: 15 15 21 16   SpO2: 96% 95% 98% 97%   Constitutional: She is oriented to person, place, and time when awake. She appears well-developed and over-nourished.  HENT: Normocephalic and atraumatic, Brown eyes, Conj-pink, sclera-non-icteric.  Nose: Nose normal.  Mouth/Throat: No oropharyngeal exudate.  Neck: supple. No JVD present. No tracheal deviation present. No thyromegaly present.  Cardiovascular: Sinus rhythm on the monitor, S1 and S2 soft. There was soft systolic murmur at left lower sternal border no S3 gallop  Respiratory: Decreased breath sound at bases without rhonchi.  GI: Soft, distended. Bowel sounds are normal. There is no tenderness.  Musculoskeletal: No clubbing or cyanosis. Trace edema noted  Neurological: She is alert and oriented to person, place, and time.    Labs on Admission:  Basic Metabolic Panel:  Recent Labs Lab 09/01/12 0552  NA 138  K 4.1  CL 100  CO2 31  GLUCOSE 79  BUN 15  CREATININE 0.94  CALCIUM 10.1   Liver Function Tests:  Recent Labs Lab 09/01/12 0552  AST 31  ALT 14  ALKPHOS 59  BILITOT 0.4  PROT 6.0  ALBUMIN 2.6*   CBC:  Recent Labs Lab 09/01/12 0552  WBC 5.8  HGB 13.5  HCT 40.6  MCV 100.2*  PLT 100*    BNP (last 3 results)  Recent Labs  05/23/12 0452 07/27/12 1717  08/06/12 1437  PROBNP 53.1 357.4* 80.5   Radiological Exams on Admission: Dg Chest 2 View  08/31/2012  *RADIOLOGY REPORT*  Clinical Data: Chest abdominal pain.  Cough.  CHEST - 2 VIEW  Comparison: 08/15/2012  Findings: Subsegmental atelectasis is seen in the right mid lung. No evidence of pulmonary air space disease or edema.  No evidence of pleural effusion.  Heart size is within normal limits for low lung volumes.  No mass or lymphadenopathy identified.  IMPRESSION: Low lung volumes with mild right midlung subsegmental atelectasis. No evidence of pulmonary consolidation or edema.   Original Report Authenticated By: Myles Rosenthal, M.D.    Dg Chest Port 1 View  09/01/2012  *RADIOLOGY REPORT*  Clinical Data: Hypotension.  PORTABLE CHEST -  1 VIEW  Comparison: 08/31/2012  Findings: Shallow inspiration.  Normal heart size and pulmonary vascularity.  Linear atelectasis in the right mid lung and both lung bases.  No blunting of costophrenic angles.  No pneumothorax. Mediastinal contours appear intact.  Degenerative changes in the shoulders.  IMPRESSION: Shallow inspiration.  Linear atelectasis in the right mid lung and both lung bases.   Original Report Authenticated By: Burman Nieves, M.D.     Assessment/Plan 1-Acute on chronic hypercarbic resp failure: Patient discussed with PCP who will make orders for admission and to follow during hospitalization. Will start patient on BIPAP, reevaluate ABG in 1 hour. Needs admission to step-down. Continue pulmicort nebulizer and also PRN albuterol and atrovent.  2-Chest discomfort: EKG w/o acute ischemic changes. Cycle troponin. Start PPI BID as she is at high risk for reflux due to body habitus.   Rest of medical problems per PCP/admitting. Continue home medication regimen.    [facility] Pager [phone]

## 2012-09-01 NOTE — Progress Notes (Addendum)
Pt became more lethargic and would not answer my orientation questions (whereas earlier today around 1300 she was eating lunch and able to answer orientation questions while on 3L [place]).  Called RT to place pt back on BIPAP. Will continue to monitor.  Salomon Mast, RN

## 2012-09-01 NOTE — Progress Notes (Signed)
Placed back on BiPAP per MD's request

## 2012-09-01 NOTE — Progress Notes (Signed)
Placed pt. On BIPAP per MD order. Pt. Tolerating well at this time. RN aware.

## 2012-09-01 NOTE — ED Notes (Signed)
Patient currently sitting up in bed; no respiratory or acute distress noted.  Patient updated on plan of care; informed patient that we are currently waiting on EDP evaluation; patient requesting IV fluids.  Informed patient that we need an order before fluids can be administered.  Patient denies any other needs at this time; will continue to monitor.

## 2012-09-01 NOTE — ED Notes (Signed)
Respiratory therapist paged to collect ABG; respiratory therapist states that they are on their way.

## 2012-09-01 NOTE — Progress Notes (Signed)
Hypotension   NS 500 cc bolus ordered; TRH paged, the floor PA, Tom will assess the patient; we will cont to monitor and help as needed it.

## 2012-09-02 ENCOUNTER — Inpatient Hospital Stay (HOSPITAL_COMMUNITY): Payer: Medicare Other

## 2012-09-02 DIAGNOSIS — J9620 Acute and chronic respiratory failure, unspecified whether with hypoxia or hypercapnia: Secondary | ICD-10-CM

## 2012-09-02 DIAGNOSIS — J9612 Chronic respiratory failure with hypercapnia: Secondary | ICD-10-CM | POA: Diagnosis present

## 2012-09-02 LAB — BLOOD GAS, ARTERIAL
Bicarbonate: 30.6 mEq/L — ABNORMAL HIGH (ref 20.0–24.0)
Expiratory PAP: 7
Patient temperature: 98.6
TCO2: 32.6 mmol/L (ref 0–100)
pCO2 arterial: 63.3 mmHg (ref 35.0–45.0)
pH, Arterial: 7.306 — ABNORMAL LOW (ref 7.350–7.450)

## 2012-09-02 LAB — BASIC METABOLIC PANEL
BUN: 14 mg/dL (ref 6–23)
CO2: 30 mEq/L (ref 19–32)
Chloride: 103 mEq/L (ref 96–112)
Glucose, Bld: 74 mg/dL (ref 70–99)
Potassium: 4.1 mEq/L (ref 3.5–5.1)

## 2012-09-02 LAB — CBC WITH DIFFERENTIAL/PLATELET
Eosinophils Absolute: 0.1 10*3/uL (ref 0.0–0.7)
Hemoglobin: 12.6 g/dL (ref 12.0–15.0)
Lymphs Abs: 1.8 10*3/uL (ref 0.7–4.0)
MCH: 33 pg (ref 26.0–34.0)
Monocytes Relative: 12 % (ref 3–12)
Neutro Abs: 2.4 10*3/uL (ref 1.7–7.7)
Neutrophils Relative %: 50 % (ref 43–77)
RBC: 3.82 MIL/uL — ABNORMAL LOW (ref 3.87–5.11)

## 2012-09-02 LAB — POCT I-STAT 3, ART BLOOD GAS (G3+)
Acid-Base Excess: 3 mmol/L — ABNORMAL HIGH (ref 0.0–2.0)
Bicarbonate: 32.1 mEq/L — ABNORMAL HIGH (ref 20.0–24.0)
O2 Saturation: 93 %
Patient temperature: 98.6
TCO2: 34 mmol/L (ref 0–100)
pH, Arterial: 7.278 — ABNORMAL LOW (ref 7.350–7.450)

## 2012-09-02 LAB — GLUCOSE, CAPILLARY
Glucose-Capillary: 61 mg/dL — ABNORMAL LOW (ref 70–99)
Glucose-Capillary: 124 mg/dL — ABNORMAL HIGH (ref 70–99)
Glucose-Capillary: 102 mg/dL — ABNORMAL HIGH (ref 70–99)

## 2012-09-02 MED ORDER — IPRATROPIUM BROMIDE 0.02 % IN SOLN
0.5000 mg | Freq: Four times a day (QID) | RESPIRATORY_TRACT | Status: DC
  Administered 2012-09-02 – 2012-09-04 (×7): 0.5 mg via RESPIRATORY_TRACT
  Filled 2012-09-02 (×8): qty 2.5

## 2012-09-02 MED ORDER — ALBUTEROL SULFATE (5 MG/ML) 0.5% IN NEBU
2.5000 mg | INHALATION_SOLUTION | RESPIRATORY_TRACT | Status: DC
  Administered 2012-09-02 (×3): 2.5 mg via RESPIRATORY_TRACT
  Filled 2012-09-02 (×3): qty 0.5

## 2012-09-02 MED ORDER — ASPIRIN 81 MG PO CHEW
CHEWABLE_TABLET | ORAL | Status: AC
  Administered 2012-09-02: 81 mg
  Filled 2012-09-02: qty 1

## 2012-09-02 MED ORDER — MUPIROCIN 2 % EX OINT
1.0000 "application " | TOPICAL_OINTMENT | Freq: Two times a day (BID) | CUTANEOUS | Status: DC
  Administered 2012-09-02 – 2012-09-05 (×7): 1 via NASAL
  Filled 2012-09-02 (×2): qty 22

## 2012-09-02 MED ORDER — DIVALPROEX SODIUM ER 500 MG PO TB24
500.0000 mg | ORAL_TABLET | Freq: Two times a day (BID) | ORAL | Status: DC
  Administered 2012-09-02 – 2012-09-05 (×7): 500 mg via ORAL
  Filled 2012-09-02 (×11): qty 1

## 2012-09-02 MED ORDER — PALIPERIDONE ER 3 MG PO TB24
3.0000 mg | ORAL_TABLET | Freq: Two times a day (BID) | ORAL | Status: DC
  Administered 2012-09-02 (×2): 3 mg via ORAL
  Filled 2012-09-02 (×5): qty 1

## 2012-09-02 MED ORDER — ALBUTEROL SULFATE (5 MG/ML) 0.5% IN NEBU
2.5000 mg | INHALATION_SOLUTION | Freq: Four times a day (QID) | RESPIRATORY_TRACT | Status: DC
  Administered 2012-09-02 – 2012-09-04 (×7): 2.5 mg via RESPIRATORY_TRACT
  Filled 2012-09-02 (×8): qty 0.5

## 2012-09-02 MED ORDER — DEXTROSE 50 % IV SOLN: 25.0000 mL | Freq: Once | INTRAVENOUS | Status: AC | PRN

## 2012-09-02 MED ORDER — IPRATROPIUM BROMIDE 0.02 % IN SOLN
0.5000 mg | RESPIRATORY_TRACT | Status: DC
  Administered 2012-09-02 (×3): 0.5 mg via RESPIRATORY_TRACT
  Filled 2012-09-02 (×3): qty 2.5

## 2012-09-02 MED ORDER — DEXTROSE 50 % IV SOLN
INTRAVENOUS | Status: AC
  Administered 2012-09-02: 50 mL
  Filled 2012-09-02: qty 50

## 2012-09-02 MED ORDER — DEXTROSE-NACL 5-0.45 % IV SOLN
INTRAVENOUS | Status: DC
  Administered 2012-09-02: 09:00:00 via INTRAVENOUS

## 2012-09-02 MED ORDER — CHLORHEXIDINE GLUCONATE CLOTH 2 % EX PADS
6.0000 | MEDICATED_PAD | Freq: Every day | CUTANEOUS | Status: DC
Start: 2012-09-02 — End: 2012-09-05
  Administered 2012-09-02 – 2012-09-05 (×4): 6 via TOPICAL

## 2012-09-02 NOTE — Progress Notes (Signed)
Subjective:  Sleepy from hypercarbia and possible antipsychotic medications. On Bipap with increased pressures.  Objective:  Vital Signs in the last 24 hours: Temp:  [97.7 F (36.5 C)-98.2 F (36.8 C)] 98 F (36.7 C) (03/16 0754) Pulse Rate:  [69-96] 81 (03/16 0800) Cardiac Rhythm:  [-] Normal sinus rhythm (03/16 0800) Resp:  [10-26] 10 (03/16 0800) BP: (72-124)/(27-90) 100/58 mmHg (03/16 0800) SpO2:  [94 %-100 %] 100 % (03/16 0800) FiO2 (%):  [30 %-40 %] 30 % (03/16 0800) Weight:  [129.5 kg (285 lb 7.9 oz)-142.9 kg (315 lb 0.6 oz)] 142.9 kg (315 lb 0.6 oz) (03/16 0405)  Physical Exam: BP Readings from Last 1 Encounters:  09/02/12 100/58     Wt Readings from Last 1 Encounters:  09/02/12 142.9 kg (315 lb 0.6 oz)    Weight change:   HEENT: [place]/AT, Eyes-Brown, PERL, EOMI, Conjunctiva-Pink, Sclera-Non-icteric Neck: No JVD, No bruit, Trachea midline. Lungs:  Clear, Bilateral. Cardiac:  Regular rhythm, normal S1 and S2, no S3. II/VI systolic murmur. Abdomen:  Soft, non-tender. Extremities:  Trace edema present. No cyanosis. No clubbing. CNS: AxOx3, Cranial nerves grossly intact, moves all 4 extremities. Right handed. Skin: Warm and dry.   Intake/Output from previous day: 03/15 0701 - 03/16 0700 In: 2193.8 [P.O.:590; I.V.:853.8; IV Piggyback:750] Out: 150 [Urine:150]    Lab Results: BMET    Component Value Date/Time   NA 139 09/02/2012 0720   K 4.1 09/02/2012 0720   CL 103 09/02/2012 0720   CO2 30 09/02/2012 0720   GLUCOSE 74 09/02/2012 0720   BUN 14 09/02/2012 0720   CREATININE 0.67 09/02/2012 0720   CALCIUM 10.1 09/02/2012 0720   GFRNONAA >90 09/02/2012 0720   GFRAA >90 09/02/2012 0720   CBC    Component Value Date/Time   WBC 4.9 09/02/2012 0720   RBC 3.82* 09/02/2012 0720   HGB 12.6 09/02/2012 0720   HCT 38.8 09/02/2012 0720   PLT 95* 09/02/2012 0720   MCV 101.6* 09/02/2012 0720   MCH 33.0 09/02/2012 0720   MCHC 32.5 09/02/2012 0720   RDW 15.4 09/02/2012 0720   LYMPHSABS  1.8 09/02/2012 0720   MONOABS 0.6 09/02/2012 0720   EOSABS 0.1 09/02/2012 0720   BASOSABS 0.0 09/02/2012 0720   CARDIAC ENZYMES Lab Results  Component Value Date   CKTOTAL 42 06/03/2012   CKMB 1.7 06/03/2012   TROPONINI <0.30 08/15/2012    Scheduled Meds: . ipratropium  0.5 mg Nebulization Q4H   And  . albuterol  2.5 mg Nebulization Q4H  . antiseptic oral rinse  15 mL Mouth Rinse BID  . aspirin EC  81 mg Oral q morning - 10a  . budesonide  0.25 mg Nebulization BID  . Chlorhexidine Gluconate Cloth  6 each Topical Q0600  . divalproex  500 mg Oral BID  . heparin  5,000 Units Subcutaneous Q8H  . levothyroxine  150 mcg Oral QAC breakfast  . multivitamin with minerals  1 tablet Oral q morning - 10a  . mupirocin ointment  1 application Nasal BID  . paliperidone  3 mg Oral BID  . pantoprazole  40 mg Oral Daily  . [START ON 09/03/2012] polyethylene glycol  17 g Oral Q72H  . sodium chloride  3 mL Intravenous Q12H   Continuous Infusions: . sodium chloride 75 mL/hr at 09/02/12 0804   PRN Meds:.sodium chloride, dextrose, fluticasone, nitroGLYCERIN  Assessment/Plan: Acute respiratory failure.  Hypertension  Paroxysmal atrial flutter  Morbid obesity  Obesity hypoventilation syndrome/obstructive sleep apnea  Hypothyroidism  History of tobacco abuse  Seizure disorder  Schizoaffective disorder Hypoglycemia from poor oral intake.  Discontinue metoprolol for now.  Change IV fluid to D51/2 NS. Decrease antipsychotic medications dose till more awake.    LOS: 1 day    Orpah Cobb  MD  09/02/2012, 9:14 AM

## 2012-09-02 NOTE — Progress Notes (Signed)
Pt states she does not wish to wear Bipap at this time. Pt states it is not comfortable to wear. No distress noted at this time. Vitals WNL. RT will continue to monitor

## 2012-09-02 NOTE — Progress Notes (Signed)
Pt became lethargic. RT placed Bipap mask on 14/7 at 30%. Pt tolerated for about 5 minutes and began to take it off. RN and RT at bedside. Pt continuing to say she will not wear mask. RT and RN attempted to explain that pt needs to wear mask but pt insists she just needs her nasal cannula. Pt vitals WNL. Pt responding to commands appropriately at this time. No distress noted. RT will continue to monitor.

## 2012-09-02 NOTE — Progress Notes (Signed)
Hypoglycemic Event  CBG: 61  Treatment: D50 IV 50 mL  Symptoms: None  Follow-up CBG: Time:0830 CBG Result:124  Possible Reasons for Event: Inadequate meal intake  Comments/MD notified:Tammy Parrett, NP on unit rounding.    Ronal Fear, RN  Remember to initiate Hypoglycemia Order Set & complete

## 2012-09-02 NOTE — Consult Note (Signed)
Name: [name] MRN: [id number] DOB: 12/10/1950    LOS: 1  Studies: 03/28/2012 TTE: EF 50-55%, grade 1 diastolic dysfunction, no TR jet  3/15 CXR: Shallow inspiration. On my read, there is no focal infiltrate. 3/14 EKG: Normal sinus rhythm  Cultures:  Antibiotics:  Lines/tubes:  Referring Provider:  Dr. Algie Coffer Reason for Referral:  Hypercapnic respiratory failure  PULMONARY / CRITICAL CARE MEDICINE  HPI:   62 year old female with past medical history significant for paroxysmal atrial flutter, COPD, OSA/OHS, tobacco abuse, morbid obesity admitted 3/15 to cardiology service with shortness of breath and hypersomnolence. Found to be in hypercapnic respiratory insufficiency. She has been afebrile. Her blood pressure has been borderline, with a systolic ranging from 90s to 100s. It does not appear that any sedating medicines have been administered.  Patient is obtunded and unable to provide additional history.  Past Medical History  Diagnosis Date  . Schizoaffective disorder   . Urinary tract infection   . Hypertension   . GERD (gastroesophageal reflux disease)   . Hypothyroidism   . Obesity   . Asthma   . COPD (chronic obstructive pulmonary disease)   . CHF (congestive heart failure)   . Seizures   . Morbid obesity   . Chronic pain   . Hypercholesteremia   . Anginal pain   . Myocardial infarction 2002  . Pneumonia     "several times" (05/23/2012)  . Diabetes mellitus     "I'm not diabetic anymore" (05/23/2012)  . Anemia   . History of blood transfusion 1993    "when I had my hysterectomy" (05/23/2012)  . H/O hiatal hernia   . Daily headache   . Arthritis     "severe; all over my body" (05/23/2012)  . Hypoventilation syndrome     [name] 05/30/2012  . Atrial fibrillation   . Atrial flutter, paroxysmal     [name] 05/30/2012  . Exertional dyspnea   . Shortness of breath     "all the time lately" (05/30/2012)   Past Surgical History  Procedure Laterality Date   . Vaginal hysterectomy  1993  . Tubal ligation  1998  . Cholecystectomy  ?2008  . Cardiac catheterization  2008   Prior to Admission medications   Medication Sig Start Date End Date Taking? Authorizing Provider  albuterol (PROVENTIL HFA;VENTOLIN HFA) 108 (90 BASE) MCG/ACT inhaler Inhale 2 puffs into the lungs every 6 (six) hours as needed for wheezing.   Yes Historical Provider, MD  aspirin EC 81 MG tablet Take 81 mg by mouth every morning.  05/11/12  Yes Neil Mashburn, PA-C  budesonide (PULMICORT) 0.25 MG/2ML nebulizer solution Take 0.25 mg by nebulization 2 (two) times daily.   Yes Historical Provider, MD  chlorproMAZINE (THORAZINE) 50 MG tablet Take 50-200 mg by mouth 3 (three) times daily - between meals and at bedtime. 50mg  twice a day and 200mg  at bedtime   Yes Historical Provider, MD  divalproex (DEPAKOTE ER) 500 MG 24 hr tablet Take 1,000 mg by mouth 2 (two) times daily.    Yes Historical Provider, MD  fluticasone (FLONASE) 50 MCG/ACT nasal spray Place 2 sprays into the nose daily as needed. For nasal congestion   Yes Historical Provider, MD  levothyroxine (SYNTHROID, LEVOTHROID) 150 MCG tablet Take 150 mcg by mouth every morning. For thyroid disease. 05/11/12  Yes Verne Spurr, PA-C  metoprolol succinate (TOPROL-XL) 50 MG 24 hr tablet Take 50 mg by mouth daily. Take with or immediately following a meal.   Yes Historical Provider,  MD  Multiple Vitamin (MULTIVITAMIN WITH MINERALS) TABS Take 1 tablet by mouth every morning. For nutritional supplement. 05/11/12  Yes Verne Spurr, PA-C  nitroGLYCERIN (NITROSTAT) 0.4 MG SL tablet Place 0.4 mg under the tongue every 5 (five) minutes as needed. For chest pain   Yes Historical Provider, MD  omeprazole (PRILOSEC) 20 MG capsule Take 20 mg by mouth every morning.  05/11/12  Yes Verne Spurr, PA-C  paliperidone ([name]) 6 MG 24 hr tablet Take 6 mg by mouth 2 (two) times daily.   Yes Historical Provider, MD  polyethylene glycol (MIRALAX /  GLYCOLAX) packet Take 17 g by mouth every 3 (three) days.    Yes Historical Provider, MD  traMADol (ULTRAM) 50 MG tablet Take 50 mg by mouth 4 (four) times daily.    Yes Historical Provider, MD   Allergies Allergies  Allergen Reactions  . Food Rash    "lasagna"= rash from the noodles  . Penicillins Hives  . Sulfamethoxazole W-Trimethoprim Hives and Nausea And Vomiting    Family History Family History  Problem Relation Age of Onset  . Heart disease Father   . Cancer Mother 69    Breast   Social History  reports that she quit smoking about 12 years ago. Her smoking use included Cigarettes. She has a 24 pack-year smoking history. Her smokeless tobacco use includes Snuff and Chew. She reports that she does not drink alcohol or use illicit drugs.  Review Of Systems:   Unable to perform full ROS due to patient condition.   Brief patient description:  63 year-old female with OHS and morbid obesity admitted 3/15 with hypercapnic respiratory failure.  Events Since Admission:   Vital Signs: Temp:  [97.7 F (36.5 C)-98.2 F (36.8 C)] 97.7 F (36.5 C) (03/15 2337) Pulse Rate:  [69-96] 73 (03/16 0130) Resp:  [10-23] 17 (03/16 0130) BP: (72-124)/(27-90) 100/56 mmHg (03/16 0130) SpO2:  [93 %-100 %] 100 % (03/16 0130) FiO2 (%):  [30 %-40 %] 30 % (03/15 2359) Weight:  [285 lb 7.9 oz (129.5 kg)] 285 lb 7.9 oz (129.5 kg) (03/15 1100)  Physical Examination: General:  Obese white female lying in bed with BiPAP on.  Neuro:  She opens her eyes to voice. She mumbles and discernibly to my questions. She does not follow commands. HEENT:  Pupils equal round and reactive to light, normocephalic atraumatic Neck:  Supple, no JVD Cardiovascular:  Distant heart sounds. Regular rate Lungs:  Distant breath sounds, no tachypnea. Appears to be synchronized with the ventilator without failure to trigger Abdomen:  Soft nontender nondistended Musculoskeletal:  Obese limbs, no joint deformities Skin:   Warm, no rash  Active Problems:   Hypercapnic respiratory failure, chronic   ASSESSMENT AND PLAN Hypercapnic respiratory failure -Most likely from the OHS (285 pounds, 5 foot 2); unclear how much COPD contributes; her only potentially respiratory depressive home medication is tramadol -03/30/2012 baseline ABG = 7.41/53/159 -Current ABG = 7.25/69/94 -BiPAP. I increased the settings from 14/7 to 18/7 and the minute ventilation improved from 4 to 7.  -ABG in one hour -Noncompliance to BiPAP this evening has slowed her improvement -If mental status fails to improve, may require intubation -I don't hear wheezing on exam, so no indication for steroids which in this patient might be detrimental if she has underlying diabetes -Agree with continuing budesonide; will discontinue albuterol and provide DuoNeb while patient is on BiPAP   BEST PRACTICE / DISPOSITION Level of Care; Billing level or time spent:  Greater than 35  minutes of critical care time Primary Service:  Dr. Algie Coffer Consultants:  Cardiology Code Status:  Full Diet:  Full liquid DVT Px:  Heparin GI Px:  PPI Skin Integrity:  Intact Social / Family:   Lines/Tubes: No central lines.  Helen Hashimoto, M.D. Pulmonary and Critical Care Medicine [facility] Pager: [phone]  09/02/2012, 2:22 AM

## 2012-09-02 NOTE — Progress Notes (Signed)
Utilization review completed.  

## 2012-09-03 ENCOUNTER — Inpatient Hospital Stay (HOSPITAL_COMMUNITY): Payer: Medicare Other

## 2012-09-03 DIAGNOSIS — R4182 Altered mental status, unspecified: Secondary | ICD-10-CM

## 2012-09-03 LAB — GLUCOSE, CAPILLARY
Glucose-Capillary: 90 mg/dL (ref 70–99)
Glucose-Capillary: 91 mg/dL (ref 70–99)
Glucose-Capillary: 99 mg/dL (ref 70–99)

## 2012-09-03 LAB — BASIC METABOLIC PANEL
BUN: 9 mg/dL (ref 6–23)
Chloride: 102 mEq/L (ref 96–112)
Creatinine, Ser: 0.62 mg/dL (ref 0.50–1.10)
GFR calc Af Amer: >90 mL/min (ref >90)
GFR calc non Af Amer: >90 mL/min (ref >90)

## 2012-09-03 LAB — CBC
HCT: 35.8 % — ABNORMAL LOW (ref 36.0–46.0)
MCHC: 33.2 g/dL (ref 30.0–36.0)
MCV: 99.2 fL (ref 78.0–100.0)
Platelets: 117 10*3/uL — ABNORMAL LOW (ref 150–400)
RDW: 15.5 % (ref 11.5–15.5)
WBC: 5.1 10*3/uL (ref 4.0–10.5)

## 2012-09-03 MED ORDER — ASPIRIN 81 MG PO CHEW
CHEWABLE_TABLET | ORAL | Status: AC
  Administered 2012-09-03: 81 mg
  Filled 2012-09-03: qty 1

## 2012-09-03 MED ORDER — FUROSEMIDE 10 MG/ML IJ SOLN
40.0000 mg | Freq: Once | INTRAMUSCULAR | Status: AC
  Administered 2012-09-03: 40 mg via INTRAVENOUS
  Filled 2012-09-03: qty 4

## 2012-09-03 MED ORDER — PALIPERIDONE ER 3 MG PO TB24
3.0000 mg | ORAL_TABLET | Freq: Every day | ORAL | Status: DC
  Administered 2012-09-03 – 2012-09-05 (×3): 3 mg via ORAL
  Filled 2012-09-03 (×3): qty 1

## 2012-09-03 NOTE — Progress Notes (Signed)
PT has refused CPAP machine for tonight. PT is on 2 lpm nasal cannula and says she sleeps fine with that. NO respiratory distress noted at this time. RT will continue to assist as needed.

## 2012-09-03 NOTE — Progress Notes (Signed)
Name: [name] MRN: [id number] DOB: [birth year]    LOS: 2   Referring Provider:  Dr. Algie Coffer Reason for Referral:  Hypercapnic respiratory failure  PULMONARY / CRITICAL CARE MEDICINE  HPI:   62 year old female with past medical history significant for paroxysmal atrial flutter, COPD, OSA/OHS, tobacco abuse, morbid obesity admitted 3/15 to cardiology service with shortness of breath and hypersomnolence. Found to be in hypercapnic respiratory insufficiency. She has been afebrile. Her blood pressure has been borderline, with a systolic ranging from 90s to 100s. It does not appear that any sedating medicines have been administered.   Studies: 03/28/2012 TTE: EF 50-55%, grade 1 diastolic dysfunction, no TR jet  3/15 CXR: Shallow inspiration. On my read, there is no focal infiltrate. 3/14 EKG: Normal sinus rhythm  Cultures:  Antibiotics:  Lines/tubes: .  Events Since Admission: 3/17 improved with bipap , refused overnight & sleepy again   Vital Signs: Temp:  [97.9 F (36.6 C)-99.8 F (37.7 C)] 98.9 F (37.2 C) (03/17 0748) Pulse Rate:  [78-100] 97 (03/17 1000) Resp:  [10-24] 19 (03/17 1000) BP: (86-135)/(58-100) 113/70 mmHg (03/17 1000) SpO2:  [92 %-100 %] 97 % (03/17 1000) FiO2 (%):  [30 %] 30 % (03/16 1100) Weight:  [145.1 kg (319 lb 14.2 oz)] 145.1 kg (319 lb 14.2 oz) (03/17 0500)  Physical Examination: General:  Obese white female lying in bed with BiPAP on.  Neuro:  She opens her eyes to voice. She mumbles and discernibly to my questions. She does not follow commands. HEENT:  Pupils equal round and reactive to light, normocephalic atraumatic Neck:  Supple, no JVD Cardiovascular:  Distant heart sounds. Regular rate Lungs:  Distant breath sounds, no tachypnea. Appears to be synchronized with the ventilator without failure to trigger Abdomen:  Soft nontender nondistended Musculoskeletal:  Obese limbs, no joint deformities Skin:  Warm, no rash  Active Problems:   Hypercapnic respiratory failure, chronic   ASSESSMENT AND PLAN Hypercapnic respiratory failure -Most likely from the OHS & schizo meds (285 pounds, 5 foot 2); unclear how much COPD contributes -03/30/2012 baseline ABG = 7.41/53/159  -Noncompliance to BiPAP overnight has slowed her improvement, will change to CPAP qhs & during sleep -EVEN DAYTIME -Agree with continuing budesonide; will discontinue albuterol and provide DuoNeb while patient is on BiPAP   NEURO  Hold paliperidone (lonig acting) & thorazine If she gets unruly, restart thorazine first  OK to transfer to tele, she has 6 admissions in last 6months - Ask [facility] to get involved  To rpevent readmissions  BEST PRACTICE / DISPOSITION Primary Service:  Dr. Algie Coffer Consultants:  Cardiology Code Status:  Full Diet:  Full liquid DVT Px:  Heparin GI Px:  PPI Skin Integrity:  Intact Social / Family:     Oretha Milch., M.D. [phone] Pulmonary and Critical Care Medicine [facility] Pager: [phone]  09/03/2012, 10:57 AM

## 2012-09-03 NOTE — Progress Notes (Signed)
11:45 Pt refuses BIPAP took off herself.  Placed on 2L [place] SPO2 98%

## 2012-09-03 NOTE — Progress Notes (Signed)
Brief Nutrition Note  Malnutrition Screening Tool result is inaccurate.  Please consult if nutrition needs are identified.  Joaquin Courts, RD, LDN, CNSC Pager# [phone] After Hours Pager# [phone]

## 2012-09-03 NOTE — Progress Notes (Signed)
[facility]  Patient Status: Active (receiving services up to time of hospitalization)  [facility] is providing the following services: RN, PT, OT, MSW and HHA  If patient discharges after hours, please call [phone].   [name] 09/03/2012, 4:40 PM

## 2012-09-03 NOTE — Progress Notes (Signed)
Subjective:  Patient is more alert and awake today. Denies any chest pain or shortness of breath. Discussed with patient regarding skilled nursing facility but refusing at present wants to go home  Objective:  Vital Signs in the last 24 hours: Temp:  [97.9 F (36.6 C)-99.8 F (37.7 C)] 98.9 F (37.2 C) (03/17 0748) Pulse Rate:  [78-100] 94 (03/17 1040) Resp:  [10-24] 17 (03/17 1040) BP: (86-135)/(60-100) 113/70 mmHg (03/17 1000) SpO2:  [92 %-100 %] 99 % (03/17 1147) Weight:  [145.1 kg (319 lb 14.2 oz)] 145.1 kg (319 lb 14.2 oz) (03/17 0500)  Intake/Output from previous day: 03/16 0701 - 03/17 0700 In: 2205 [P.O.:780; I.V.:1425] Out: 400 [Urine:400] Intake/Output from this shift: Total I/O In: 75 [I.V.:75] Out: -   Physical Exam: Neck: no adenopathy, no carotid bruit, no JVD and supple, symmetrical, trachea midline Lungs: Decreased breath sound at bases Heart: regular rate and rhythm, S1, S2 normal and Soft systolic murmur noted no S3 gallop Abdomen: soft, non-tender; bowel sounds normal; no masses,  no organomegaly Extremities: extremities normal, atraumatic, no cyanosis or edema  Lab Results:  Recent Labs  09/02/12 0720 09/03/12 0431  WBC 4.9 5.1  HGB 12.6 11.9*  PLT 95* 117*    Recent Labs  09/02/12 0720 09/03/12 0431  NA 139 136  K 4.1 3.8  CL 103 102  CO2 30 30  GLUCOSE 74 95  BUN 14 9  CREATININE 0.67 0.62   No results found for this basename: TROPONINI, CK, MB,  in the last 72 hours Hepatic Function Panel  Recent Labs  09/01/12 0552  PROT 6.0  ALBUMIN 2.6*  AST 31  ALT 14  ALKPHOS 59  BILITOT 0.4   No results found for this basename: CHOL,  in the last 72 hours No results found for this basename: PROTIME,  in the last 72 hours  Imaging: Imaging results have been reviewed  Cardiac Studies:  Assessment/Plan:  Acute on chronic hypercarbic respiratory failure Hypertension  Paroxysmal atrial flutter  Morbid obesity  Obesity  hypoventilation syndrome/obstructive sleep apnea  Hypothyroidism  History of tobacco abuse  Seizure disorder  Schizoaffective disorder Plan Transfer to telemetry Social service for discharge planning   LOS: 2 days    Dyneisha Murchison N 09/03/2012, 11:50 AM

## 2012-09-04 DIAGNOSIS — J9620 Acute and chronic respiratory failure, unspecified whether with hypoxia or hypercapnia: Principal | ICD-10-CM

## 2012-09-04 LAB — BLOOD GAS, ARTERIAL
Acid-Base Excess: 5.4 mmol/L — ABNORMAL HIGH (ref 0.0–2.0)
Drawn by: 25203
O2 Content: 2 L/min
O2 Saturation: 97.6 %
Patient temperature: 98.6
TCO2: 31.2 mmol/L (ref 0–100)

## 2012-09-04 LAB — BASIC METABOLIC PANEL
Calcium: 10.2 mg/dL (ref 8.4–10.5)
GFR calc non Af Amer: >90 mL/min (ref >90)
Glucose, Bld: 87 mg/dL (ref 70–99)
Sodium: 137 mEq/L (ref 135–145)

## 2012-09-04 LAB — URINE CULTURE
Colony Count: NO GROWTH
Culture: NO GROWTH

## 2012-09-04 LAB — GLUCOSE, CAPILLARY: Glucose-Capillary: 83 mg/dL (ref 70–99)

## 2012-09-04 MED ORDER — ALBUTEROL SULFATE (5 MG/ML) 0.5% IN NEBU: 2.5000 mg | INHALATION_SOLUTION | Freq: Four times a day (QID) | RESPIRATORY_TRACT | Status: DC

## 2012-09-04 MED ORDER — IPRATROPIUM BROMIDE 0.02 % IN SOLN
0.5000 mg | Freq: Three times a day (TID) | RESPIRATORY_TRACT | Status: DC
  Administered 2012-09-04 – 2012-09-05 (×2): 0.5 mg via RESPIRATORY_TRACT
  Filled 2012-09-04 (×3): qty 2.5

## 2012-09-04 MED ORDER — ALBUTEROL SULFATE (5 MG/ML) 0.5% IN NEBU
2.5000 mg | INHALATION_SOLUTION | Freq: Three times a day (TID) | RESPIRATORY_TRACT | Status: DC
  Administered 2012-09-04 – 2012-09-05 (×2): 2.5 mg via RESPIRATORY_TRACT
  Filled 2012-09-04 (×3): qty 0.5

## 2012-09-04 MED ORDER — IPRATROPIUM BROMIDE 0.02 % IN SOLN: 0.5000 mg | Freq: Three times a day (TID) | RESPIRATORY_TRACT | Status: DC

## 2012-09-04 NOTE — Progress Notes (Signed)
Paged MD Sharyn Lull x2 patient  Requesting to leave AMA and also make him aware that he has to call the psy consult in to  [phone]

## 2012-09-04 NOTE — Progress Notes (Signed)
Pt continues to call out and yell at the RN. She insist on calling the doctor so that she may go home- she said that her son had died. RN called home and was told that no one had passed away and that everything was good. RN called doctor to pass on the news. Doctor ordered invega 3mg  to help the pt cope. Will continue to monitor pt.

## 2012-09-04 NOTE — Progress Notes (Signed)
Pt asked that her IV be taken out because it was causing her increase discomfort with every minute that passed. RN inspected IV and arm looked swollen-IV was taken out. Pt refuses to get another IV- insisting that she will  be leaving today so there is no reason for it.

## 2012-09-04 NOTE — Progress Notes (Signed)
PT has been refusing CPAP machine at night. Day and night shift RT have encouraged her to wear it but she continues to say no. RT will continue to assist as needed.

## 2012-09-04 NOTE — Progress Notes (Signed)
Subjective:  Patient denies any chest pain or shortness of breath eager to go home. Spoke with husband states he cannot take care of her at home and needs to go to some type of nursing home/rehabilitation. Will consult social worker regarding discharge planning  Objective:  Vital Signs in the last 24 hours: Temp:  [98 F (36.7 C)-98.6 F (37 C)] 98.6 F (37 C) (03/18 0452) Pulse Rate:  [89-97] 90 (03/18 0452) Resp:  [17-21] 18 (03/18 0452) BP: (113-146)/(70-87) 146/87 mmHg (03/18 0452) SpO2:  [97 %-100 %] 99 % (03/18 0746) Weight:  [142.7 kg (314 lb 9.5 oz)] 142.7 kg (314 lb 9.5 oz) (03/18 0500)  Intake/Output from previous day: 03/17 0701 - 03/18 0700 In: 735 [P.O.:360; I.V.:375] Out: 250 [Urine:250] Intake/Output from this shift: Total I/O In: 120 [P.O.:120] Out: -   Physical Exam: Neck: no adenopathy, no carotid bruit, no JVD and supple, symmetrical, trachea midline Lungs: Decreased breath sound at bases Heart: regular rate and rhythm, S1, S2 normal and Soft systolic murmur noted no S3 gallop Abdomen: soft, non-tender; bowel sounds normal; no masses,  no organomegaly Extremities: extremities normal, atraumatic, no cyanosis or edema  Lab Results:  Recent Labs  09/02/12 0720 09/03/12 0431  WBC 4.9 5.1  HGB 12.6 11.9*  PLT 95* 117*    Recent Labs  09/03/12 0431 09/04/12 0535  NA 136 137  K 3.8 4.3  CL 102 99  CO2 30 32  GLUCOSE 95 87  BUN 9 9  CREATININE 0.62 0.72   No results found for this basename: TROPONINI, CK, MB,  in the last 72 hours Hepatic Function Panel No results found for this basename: PROT, ALBUMIN, AST, ALT, ALKPHOS, BILITOT, BILIDIR, IBILI,  in the last 72 hours No results found for this basename: CHOL,  in the last 72 hours No results found for this basename: PROTIME,  in the last 72 hours  Imaging: Imaging results have been reviewed and Dg Chest Port 1 View  09/03/2012  *RADIOLOGY REPORT*  Clinical Data: COPD.  PORTABLE CHEST - 1 VIEW   Comparison: 09/02/2012  Findings: Low lung volumes are again demonstrated with mild bibasilar atelectasis.  No evidence of pulmonary consolidation or pleural effusion.  Cardiomegaly stable.  IMPRESSION: Stable cardiomegaly and bibasilar atelectasis.  No evidence of pulmonary consolidation.   Original Report Authenticated By: Myles Rosenthal, M.D.     Cardiac Studies:  Assessment/Plan:  Acute on chronic hypercarbic respiratory failure  Hypertension  Paroxysmal atrial flutter  Morbid obesity  Obesity hypoventilation syndrome/obstructive sleep apnea  Hypothyroidism  History of tobacco abuse  Seizure disorder  Schizoaffective disorder Plan OT PT consult Social service for discharge planning  LOS: 3 days    [name] N 09/04/2012, 9:58 AM

## 2012-09-04 NOTE — Progress Notes (Signed)
Name: [name] MRN: [id number] DOB: 12-10-1950    LOS: 3 Referring Provider:  Dr. Algie Coffer Reason for Referral:  Hypercapnic respiratory failure     PULMONARY / CRITICAL CARE MEDICINE  Brief Summary:   62 year old female with past medical history significant for paroxysmal atrial flutter, COPD, OSA/OHS, tobacco abuse, morbid obesity admitted 3/15 to cardiology service with shortness of breath and hypersomnolence. Found to be in hypercapnic respiratory failure.  Has historically refused CPAP / BiPAP.  PCCM asked to see 3/17   Studies: 03/28/2012 -  TTE: EF 50-55%, grade 1 diastolic dysfunction, no TR jet   3/15 - CXRShallow inspiration.   3/14 - EKG: Normal sinus rhythm  Cultures: 3/15 MRSA PCR> POS 3/15 BCx2>>> 3/16 UC>>>neg  Antibiotics:   Events Since Admission: 3/17 -  improved with bipap , refused overnight & sleepy again 3/18 - on floor, refused to wear CPAP PM 3/17   Vital Signs: Temp:  [98 F (36.7 C)-98.6 F (37 C)] 98.6 F (37 C) (03/18 0452) Pulse Rate:  [89-93] 90 (03/18 0452) Resp:  [18-21] 18 (03/18 0452) BP: (116-146)/(79-87) 146/87 mmHg (03/18 0452) SpO2:  [99 %-100 %] 99 % (03/18 0746) Weight:  [314 lb 9.5 oz (142.7 kg)] 314 lb 9.5 oz (142.7 kg) (03/18 0500) 02 rx  2lpm  Physical Examination: General:  Obese white female in NAD Neuro: Mumbles at times, otherwise can be understood.  NAD.  MAE HEENT:  Pupils equal round and reactive to light, normocephalic atraumatic Neck:  Supple, no JVD Cardiovascular:  Distant heart sounds. Regular rate Lungs:  Distant breath sounds, no tachypnea. Lungs clear Abdomen:  Soft nontender nondistended Musculoskeletal:  Obese limbs, no joint deformities Skin:  Warm, no rash  Active Problems:   Hypercapnic respiratory failure, chronic   ASSESSMENT AND PLAN  Hypercapnic respiratory failure - Most likely from the OHS & schizo meds (285 pounds, 5 foot 2); unclear how much COPD contributes.  03/30/12 baseline  ABG = 7.41 / 53 / 159  Presumed OHS - Noncompliance to BiPAP overnight has slowed her improvement   changed to CPAP qhs & during sleep -EVEN DAYTIME as tolerated effective 3/17  PLAN: -continue to offer CPAP -flonase -duonebs while inpatient -budesonide   Schizoaffective Disorder -paliperidone (lonig acting)  -If she gets unruly, restart thorazine first -depakote   rec ask [facility] to get involved  To prevent readmissions.  BEST PRACTICE / DISPOSITION Primary Service:  Dr. Algie Coffer Consultants:  Cardiology Code Status:  Full Diet:  Full liquid DVT Px:  Heparin GI Px:  n/a Skin Integrity:  Intact Social / Family:  Not at bedside   Sandrea Hughs, MD Pulmonary and Critical Care Medicine [facility] Cell [phone] After 5:30 PM or weekends, call [phone]

## 2012-09-04 NOTE — Evaluation (Signed)
Occupational Therapy Evaluation Patient Details Name: [name] MRN: [id number] DOB: August 10, 1950 Today's Date: 09/04/2012 Time: 4098-1191 OT Time Calculation (min): 30 min  OT Assessment / Plan / Recommendation Clinical Impression  Pt is a 62 yr old female with morbid obesity who presents with exacerbation of COPD and respiratory insufficiency.  Pt presents with severe anxiety with attempts to transfer out of bed and overall need for max assist to total assist for LB selfcare tasks at bed level.  Unable to transfer pt OOB this pm but did stand with total assit for approximately 20 -30 seconds. During this pt became hysterical and began yelling out in fear to sit down. She took a few minutes of re-direction to attempt to calm her down and eventually she returned to supine, secondary to not being able to get her to agree to try again.  Feel she will benefit from acute OT to help increase overall independence with selfcare tasks but feel she will still need extensive SNF to progress in rehab.  Pt refuses SNF but per chart her significant other cannot continue to provide care at her current level.  Unsure that she really has the capacity to make these decisions based on her awareness and participation in this therapy session.      OT Assessment  Patient needs continued OT Services    Follow Up Recommendations  SNF       Equipment Recommendations  Other (comment) Michiel Sites lift if discharge home)       Frequency  Min 2X/week    Precautions / Restrictions Precautions Precautions: Fall Precaution Comments: obesity., has not ambulated in over a year per her report Restrictions Weight Bearing Restrictions: No   Pertinent Vitals/Pain HR increased to 131 secondary to anxiety with sit to stand, O2 93% on room air    ADL  Eating/Feeding: Simulated;Independent Where Assessed - Eating/Feeding: Bed level Grooming: Performed;Brushing hair;Supervision/safety Where Assessed - Grooming: Unsupported  sitting Upper Body Bathing: Simulated;Set up Where Assessed - Upper Body Bathing: Unsupported sitting Lower Body Bathing: Simulated;Maximal assistance Where Assessed - Lower Body Bathing: Supine, head of bed flat;Rolling right and/or left Upper Body Dressing: Simulated;Set up Where Assessed - Upper Body Dressing: Unsupported sitting Lower Body Dressing: Simulated;+1 Total assistance Where Assessed - Lower Body Dressing: Supine, head of bed flat;Rolling right and/or left Tub/Shower Transfer Method: Not assessed Transfers/Ambulation Related to ADLs: Pt unable to transfer to bedside chair but did stand at EOB for 30 seconds with total +2 (pt 40%). ADL Comments: Pt with increased fear and anxiety with standing EOB.  Unable to turn and transfer to bedside chair secondary to anxiiety and pt yelling out.  Pt hysterical at times and unable to understand.  Could not get her to attempt transfer to bedside chair.      OT Diagnosis: Generalized weakness;Cognitive deficits  OT Problem List: Decreased strength;Decreased activity tolerance;Decreased safety awareness;Decreased cognition;Decreased knowledge of use of DME or AE;Obesity;Cardiopulmonary status limiting activity OT Treatment Interventions: Self-care/ADL training;Therapeutic activities;Patient/family education;DME and/or AE instruction;Balance training   OT Goals Acute Rehab OT Goals OT Goal Formulation: Patient unable to participate in goal setting Time For Goal Achievement: 09/18/12 Potential to Achieve Goals: Good ADL Goals Pt Will Perform Lower Body Bathing: with mod assist;with adaptive equipment;Sit to stand from bed ADL Goal: Lower Body Bathing - Progress: Goal set today Pt Will Perform Lower Body Dressing: with mod assist;Sit to stand from bed;with adaptive equipment ADL Goal: Lower Body Dressing - Progress: Goal set today Pt Will Transfer to  Toilet: with mod assist;Stand pivot transfer;Extra wide 3-in-1 ADL Goal: Toilet Transfer -  Progress: Goal set today Miscellaneous OT Goals Miscellaneous OT Goal #1: Pt will transfer supine to sit EOB with min assist in preparation for selfcare tasks. OT Goal: Miscellaneous Goal #1 - Progress: Goal set today  Visit Information  Last OT Received On: 09/04/12 Assistance Needed: +2    Subjective Data  Subjective: I want to go home, I want to go home. Patient Stated Goal: go home   Prior Functioning     Home Living Lives With: Significant other Available Help at Discharge: Friend(s);Available PRN/intermittently Type of Home: Apartment Home Access: Elevator Home Layout: One level Bathroom Shower/Tub: Engineer, manufacturing systems: Standard Home Adaptive Equipment: Walker - rolling;Wheelchair - manual Additional Comments: Pt reports that she slept in a recliner Prior Function Level of Independence: Needs assistance Needs Assistance: Bathing;Dressing;Toileting;Light Housekeeping;Meal Prep;Transfers Bath: Moderate Dressing: Moderate Toileting: Maximal Meal Prep: Total Light Housekeeping: Total Transfer Assistance: mod-max for OOB Able to Take Stairs?: No Driving: No Comments: pt states she was using bedpan at home and hasn't walked in last 3 months Communication Communication: Expressive difficulties (garbled speech at times) Dominant Hand: Right         Vision/Perception Vision - History Baseline Vision: No visual deficits Patient Visual Report: No change from baseline Vision - Assessment Vision Assessment: Vision not tested Perception Perception: Not tested Praxis Praxis: Not tested   Cognition  Cognition Overall Cognitive Status: Impaired Area of Impairment: Memory;Safety/judgement Behavior During Session: Anxious Memory Deficits: Pt with decreased awareness of the amount of assistance she currrently needs.  Unable to understand when therapist explained to her that she needs to be able to get OOB in order to go home.  Yelling out and hysterical at  times.  Needed max instructional cueing to try to calm down so she could be understood.  Stated at one pooint that her husband had died in that room and she wanted to go home.   Following Commands: Follows one step commands consistently;Follows multi-step commands with increased time Safety/Judgement: Decreased awareness of need for assistance    Extremity/Trunk Assessment Right Upper Extremity Assessment RUE ROM/Strength/Tone: WFL for tasks assessed RUE Sensation: WFL - Light Touch RUE Coordination: WFL - gross/fine motor Left Upper Extremity Assessment LUE ROM/Strength/Tone: WFL for tasks assessed LUE Sensation: WFL - Light Touch LUE Coordination: WFL - gross/fine motor     Mobility Bed Mobility Bed Mobility: Rolling Right;Rolling Left;Left Sidelying to Sit;Sit to Supine Rolling Right: 3: Mod assist;With rail Rolling Left: 3: Mod assist;With rail Left Sidelying to Sit: 1: +2 Total assist;With rails Left Sidelying to Sit: Patient Percentage: 40% Sit to Supine: 2: Max assist;With rail;HOB flat Transfers Transfers: Sit to Stand Sit to Stand: From bed;1: +2 Total assist;With upper extremity assist Sit to Stand: Patient Percentage: 40% Stand to Sit: 1: +2 Total assist;Without upper extremity assist;To bed        Balance Balance Balance Assessed: Yes Static Sitting Balance Static Sitting - Balance Support: No upper extremity supported Static Sitting - Level of Assistance: 5: Stand by assistance   End of Session OT - End of Session Activity Tolerance: Other (comment) (Treatment limited secondary to anxiety and fear.) Patient left: in bed;with call bell/phone within reach Nurse Communication: Mobility status    Denise Washburn OTR/L Pager number [phone] 09/04/2012, 1:38 PM

## 2012-09-04 NOTE — Progress Notes (Signed)
Patient referred to CSW for SNF placement.  CSW met with patient who adamantly refuses placement. She was recently at [facility] with a negative experience and refuses to consider any SNF.  Patient plans to return home with her friend [name] who has indicated to MD that he is unable to provide care for patient. RNCM and CSW discussed patient in length and arrangements were made for patient to return home with Home Health and referral to [facility].  CSW received a call from Dr.Harwani- per discussion with RN and this CSW- we are requesting a psychiatric consult to determine capacity as patient due to patient's recent actions at the nursing center and how she is relating to staff.  Patient has stated that her friend [name] was her husband, friend, boyfriend and eve stated that she didn't know who [name] was. She relates that her 2 daughters are available to help her however, prior reports state that her daughters were adopted as children and are not involved in her care. Should Psychiatrist determine that she has capacity and patient return home- CSW will make an Adult Protective Services referral at d/c so that her home situation can be monitored.  CSW to continue to follow and will discuss with Reece Levy, [employer]- Psych Service Line CSW.  Lorri Frederick. [place]  [phone]

## 2012-09-04 NOTE — Care Management Note (Signed)
    Page 1 of 2   09/05/2012     2:02:54 PM   CARE MANAGEMENT NOTE 09/05/2012  Patient:  SPECIAL, RANES   Account Number:  [id number]  Date Initiated:  09/04/2012  Documentation initiated by:  Letha Cape  Subjective/Objective Assessment:   dx hypecapnic respiratory failure  admit- lives with friend.     Action/Plan:   HH services   Anticipated DC Date:  09/04/2012   Anticipated DC Plan:  HOME W HOME HEALTH SERVICES  In-house referral  Clinical Social Worker      DC Associate Professor  CM consult      [facility] Choice  HOME HEALTH  Resumption Of Svcs/PTA Provider   Choice offered to / List presented to:  C-1 Patient        HH arranged  HH-1 RN  HH-2 PT  HH-3 OT  HH-4 NURSE'S AIDE  HH-6 SOCIAL WORKER      HH agency  [facility]   Status of service:  Completed, signed off Medicare Important Message given?   (If response is "NO", the following Medicare IM given date fields will be blank) Date Medicare IM given:   Date Additional Medicare IM given:    Discharge Disposition:  HOME W HOME HEALTH SERVICES  Per UR Regulation:  Reviewed for med. necessity/level of care/duration of stay  If discussed at Long Length of Stay Meetings, dates discussed:    Comments:  09/04/12 10;53 Letha Cape RN, BSN [phone] patient states she lives with a friend, she does not want to go to snf, she wants to go home, she is active with [facility] for [facility], PT, OT, aide and CSW she also wants [facility]. Informed CSW of this information.  The friend states he can not take care of patient at home, unless patient is deemed not to have capacity we can not make her go to snf. Patient will need an ambulance at discharge.  CSW is also following. Called [facility] at [phone]  and gave them patient's name and mcd number, they will try to reach her in her room today.  [facility] will also contact Dr. Jorene Minors office to get paper work sent to [facility].  Per MD he will consult Pysch for capacity.  Patient  told her Nurse she was interested in having pcs services with [facility] [phone], I called and gave them patient's information and they will have their [facility] coordinator call her at home so her PCP office can get paperwork sent to [facility].
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
## 2012-09-05 DIAGNOSIS — F259 Schizoaffective disorder, unspecified: Secondary | ICD-10-CM

## 2012-09-05 LAB — GLUCOSE, CAPILLARY
Glucose-Capillary: 87 mg/dL (ref 70–99)
Glucose-Capillary: 93 mg/dL (ref 70–99)

## 2012-09-05 MED ORDER — MUPIROCIN 2 % EX OINT: 1.0000 "application " | TOPICAL_OINTMENT | Freq: Two times a day (BID) | CUTANEOUS | Status: DC

## 2012-09-05 MED ORDER — PALIPERIDONE ER 3 MG PO TB24: 3.0000 mg | ORAL_TABLET | Freq: Every day | ORAL | Status: DC

## 2012-09-05 NOTE — Progress Notes (Signed)
Patient discharge teaching given, including activity, diet, follow-up appoints, and medications. Patient verbalized understanding of all discharge instructions. IV access was already d/c'd. Vitals are stable. Skin is intact except as charted in most recent assessments. Pt to be driven home by [facility] ambulance. Pt's friend is home to receive pt.
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
## 2012-09-05 NOTE — Progress Notes (Signed)
Patient Skin with bad excoriated skin to peri anal area. Patient found inc of urine and stool. Skin clean well found a skin tear 8.5 cm to upper right thigh placed foam dressing and Stage II to right buttock size of a quarter  Placed foam dressing. Patient skin also has slit area between buttock. Moister Barrier cream applied. Patient Also has scatch skin on the otter part of the right thigh from scratching at dry skin.

## 2012-09-05 NOTE — Discharge Summary (Signed)
[name], [name]               ACCOUNT NO.:  [id number]  MEDICAL RECORD NO.:  [id number]  LOCATION:  5508                         FACILITY:  [facility]  PHYSICIAN:  Eduardo Osier. Sharyn Lull, M.D. DATE OF BIRTH:  08-10-1950  DATE OF ADMISSION:  09/01/2012 DATE OF DISCHARGE:  09/05/2012                              DISCHARGE SUMMARY   ADMITTING DIAGNOSES: 1. Acute respiratory failure. 2. Hypertension. 3. History of paroxysmal atrial flutter. 4. Morbid obesity. 5. Obesity hypoventilation syndrome/obstructive sleep apnea. 6. Hypothyroidism. 7. History of tobacco abuse. 8. History of questionable seizure disorder. 9. Schizoaffective disorder.  DISCHARGE DIAGNOSES: 1. Status post acute-on-chronic hypercarbic respiratory failure. 2. Hypertension. 3. History of paroxysmal atrial flutter. 4. Morbid obesity. 5. Obesity hypoventilation syndrome/obstructive sleep apnea. 6. Hypothyroidism. 7. History of tobacco abuse. 8. History of questionable seizure disorder. 9. Schizoaffective disorder.  DISCHARGE HOME MEDICATIONS:  Bactroban ointment apply locally twice daily, [name] 3 mg 1 tablet daily, which has been changed from 6 mg to 3 mg daily.  Continue with other medications, i.e. albuterol inhaler 2 puffs every 6 hours as needed, aspirin 81 mg 1 tablet daily, Pulmicort nebulization twice daily as before, nasal spray Flonase 2 sprays daily as before, levothyroxine 150 mcg daily, multivitamin 1 tablet daily, Nitrostat 0.4 mg sublingual use as directed, omeprazole 20 mg daily, polyethylene glycol 17 g by mouth 3 times daily as before as needed, tramadol 50 mg 1 tablet daily.  The patient has been advised to stop Chlorazine, Depakote and Toprol-XL.  DIET:  Low salt, low cholesterol.  ACTIVITY:  Increase activity as tolerated.  Home health aide, OT, PT, RN has been arranged.  The patient will be discharged home.  The patient was discussed extensively regarding her rehab and skilled nursing  facility, but refused and wanted to go home. The patient appears to be competent enough to make decision understands the risks and benefits.  The patient will be also continued on her CPAP at home as before.  CONDITION AT DISCHARGE:  Stable.  BRIEF HISTORY AND HOSPITAL COURSE:  [name] is a 62 year old female with past medical history significant for multiple medical problems, i.e., hypertension, history of paroxysmal atrial flutter, COPD, obstructive sleep apnea/obesity hypoventilation syndrome, hypothyroidism, tobacco abuse, morbid obesity, history of hiatus hernia, seizure disorder, history of schizoaffective disorder, who was admitted by Dr. Algie Coffer because of progressive increasing shortness of breath. No fever or chills on September 01, 2012.  The patient denies any chest pain, nausea, vomiting, diaphoresis.  PHYSICAL EXAMINATION:  VITAL SIGNS:  Blood pressure was 85/56, pulse was 89, temperature was 98.1. HEENT:  Conjunctivae was pink. NECK:  Supple.  No JVD. LUNGS:  Decreased breath sounds at bases without rhonchi. CARDIOVASCULAR EXAM:  S1, S2 was soft.  There was soft systolic murmur at the left lower sternal border.  No S3 gallop. ABDOMEN:  Soft, distended.  Bowel sounds were normal.  No tenderness. EXTREMITIES:  There was no clubbing, cyanosis.  There was trace edema noted.  LABORATORY DATA:  ABGs; pH was 7.22, pCO2 was 73.6, pO2 was 80.4. Repeat ABG is 7.25, pCO2 69, pO2 94, 96%.  Repeat ABG is 7.27, pCO2 68, pO2 was 76, O2 sats 93%.  Last ABG is 7.41, 47 pCO2, pO2 86, 97%.  Her sodium was 139, potassium 4.1, BUN 14, creatinine 0.67.  Hemoglobin was 12.6, hematocrit 38.8, white count of 4.9 with no shift to the left.  BRIEF HOSPITAL COURSE:  The patient was initially admitted to Telemetry Unit, subsequently was transferred to ICU as the patient had marked metabolic acidosis with hypercarbia and was placed on BiPAP.  The patient refused for intubation.  Her  anti-schizophrenia and seizure medications were held.  Toprol was also discontinued because of low blood pressure.  The patient gradually became more awake and alert, and was transferred to telemetry bed.  Social Service consultation was obtained for possible skilled nursing facility/rehab, but the patient absolutely refused and wanted to go home.  The patient is alert, awake and oriented.  The patient will be discharged home on above medications and will be followed up in the office in 1 week.     Eduardo Osier. Sharyn Lull, M.D.     [facility]/MED[facility]  D:  09/05/2012  T:  09/05/2012  Job:  [id number]

## 2012-09-05 NOTE — Consult Note (Signed)
Patient Identification:  [name] Date of Evaluation:  09/05/2012 Reason for Consult:  Schizoaffective Disorder, Capacity  Referring Provider:  Dr. Algie Coffer  History of Present Illness: [name] 62 year old female with past medical history significant for multiple serious  medical problems primarily COPD presented with progressive shortness of breath  Past Psychiatric History:At [facility] 07/2012 Pt receives meds at [facility]. She has no therapy sessions. She cares for her 62 yo grand daughter, She says she was diagnosed at age 74 when she broke all the windows in the home. She spent 3 weeks in a psychiatric ward. She has been taking Trazodone, Depakote, Risperdal and cogentin. She has auditory [unrecognized; not command] and visual hallucinations. She cut her wrist suicide attempt 30 years ago. She says she does not drink been, no cigarettes, no cocaine, no cannabis. She was arrested once for false police report. She has been at [facility] a couple yrs ago.  Past Medical History:     Past Medical History  Diagnosis Date  . Schizoaffective disorder   . Urinary tract infection   . Hypertension   . GERD (gastroesophageal reflux disease)   . Hypothyroidism   . Obesity   . Asthma   . COPD (chronic obstructive pulmonary disease)   . CHF (congestive heart failure)   . Seizures   . Morbid obesity   . Chronic pain   . Hypercholesteremia   . Anginal pain   . Myocardial infarction 2002  . Pneumonia     "several times" (05/23/2012)  . Diabetes mellitus     "I'm not diabetic anymore" (05/23/2012)  . Anemia   . History of blood transfusion 1993    "when I had my hysterectomy" (05/23/2012)  . H/O hiatal hernia   . Daily headache   . Arthritis     "severe; all over my body" (05/23/2012)  . Hypoventilation syndrome     [name] 05/30/2012  . Atrial fibrillation   . Atrial flutter, paroxysmal     [name] 05/30/2012  . Exertional dyspnea   . Shortness of breath     "all the time lately"  (05/30/2012)       Past Surgical History  Procedure Laterality Date  . Vaginal hysterectomy  1993  . Tubal ligation  1998  . Cholecystectomy  ?2008  . Cardiac catheterization  2008    Allergies:  Allergies  Allergen Reactions  . Food Rash    "lasagna"= rash from the noodles  . Penicillins Hives  . Sulfamethoxazole W-Trimethoprim Hives and Nausea And Vomiting    Current Medications:  Prior to Admission medications   Medication Sig Start Date End Date Taking? Authorizing Provider  albuterol (PROVENTIL HFA;VENTOLIN HFA) 108 (90 BASE) MCG/ACT inhaler Inhale 2 puffs into the lungs every 6 (six) hours as needed for wheezing.   Yes Historical Provider, MD  aspirin EC 81 MG tablet Take 81 mg by mouth every morning.  05/11/12  Yes Neil Mashburn, PA-C  budesonide (PULMICORT) 0.25 MG/2ML nebulizer solution Take 0.25 mg by nebulization 2 (two) times daily.   Yes Historical Provider, MD  fluticasone (FLONASE) 50 MCG/ACT nasal spray Place 2 sprays into the nose daily as needed. For nasal congestion   Yes Historical Provider, MD  levothyroxine (SYNTHROID, LEVOTHROID) 150 MCG tablet Take 150 mcg by mouth every morning. For thyroid disease. 05/11/12  Yes Verne Spurr, PA-C  Multiple Vitamin (MULTIVITAMIN WITH MINERALS) TABS Take 1 tablet by mouth every morning. For nutritional supplement. 05/11/12  Yes Verne Spurr, PA-C  nitroGLYCERIN (NITROSTAT)  0.4 MG SL tablet Place 0.4 mg under the tongue every 5 (five) minutes as needed. For chest pain   Yes Historical Provider, MD  omeprazole (PRILOSEC) 20 MG capsule Take 20 mg by mouth every morning.  05/11/12  Yes Verne Spurr, PA-C  polyethylene glycol (MIRALAX / GLYCOLAX) packet Take 17 g by mouth every 3 (three) days.    Yes Historical Provider, MD  traMADol (ULTRAM) 50 MG tablet Take 50 mg by mouth 4 (four) times daily.    Yes Historical Provider, MD  mupirocin ointment (BACTROBAN) 2 % Apply 1 application topically 2 (two) times daily. 09/05/12   [name], MD  paliperidone ([name]) 3 MG 24 hr tablet Take 1 tablet (3 mg total) by mouth daily. 09/05/12   [name], MD    Social History:    reports that she quit smoking about 12 years ago. Her smoking use included Cigarettes. She has a 24 pack-year smoking history. Her smokeless tobacco use includes Snuff and Chew. She reports that she does not drink alcohol or use illicit drugs.   Family History:    Family History  Problem Relation Age of Onset  . Heart disease Father   . Cancer Mother 50    Breast    Mental Status Examination/Evaluation: Objective:  Appearance: Casual and edentulous  Eye Contact::  Good  Speech:  Clear and Coherent and speech altered without teth  Volume:  Decreased  Mood:  pleasant  Affect:  Appropriate  Thought Process:  Coherent, Goal Directed and Logical  Orientation:  Other:  Pt is able to state date, isoriented to person, place.  She is unable to calculate, is unable to interpret proverb with abstract reasoning.  She spells world but unable to spell i backwards; she recalls 2  of 3 items for short term memory.  She denies suicidal thoughts, AH/VH  Thought Content:  anticipates going home; anxious about leaving  Suicidal Thoughts:  No  Homicidal Thoughts:  No  Judgement:  Fair  Insight:  Lacking   DIAGNOSIS:   AXIS I   Schizoaffective Disorder  AXIS II  Deferred  AXIS III See medical notes.  AXIS IV other psychosocial or environmental problems, problems related to social environment, problems with primary support group and Pt does not say anything about caring for a 5 yo granddaughte.  This would be a CPS report if she is still watching a child  AXIS V 41-50 serious symptoms   Assessment/Plan: Dr. Algie Coffer,  Discussed with RN Yvone Neu; Psych CSW  BF is telling RN that he cannot keep up the care giving. There is a concern she needs a higher level of care.  CSW is requested to inquire about intensive home health care.     Will inquire about pt  taking care of granddaughter. RECOMMENDATION:  1.  Pt has limited capacity and is oxygen dependent - unwilling to use CPAP at night. 2.  She has a SO of 17 yrs you provides care. 3.  Suggest intensive home health care.  4.  No further psychiatric needs unless requested.  MD Psychiatrist signs off.  Mickeal Skinner MD 09/05/2012 12:30 PM

## 2012-09-05 NOTE — Progress Notes (Signed)
Physical Therapy Treatment Patient Details Name: [name] MRN: [id number] DOB: 06-Mar-1951 Today's Date: 09/05/2012 Time: 9629-5284 PT Time Calculation (min): 41 min  PT Assessment / Plan / Recommendation Comments on Treatment Session  Pt is not progressing with mobility and is significantly limited by cognitive status and hysteria. We will attempt to see her one more time but would possible sign off at that point if she continues to fail to progress. Feel strongly that going home is an unsafe situation for her, despite that she feels it will be OK.     Follow Up Recommendations  SNF;Supervision/Assistance - 24 hour     Does the patient have the potential to tolerate intense rehabilitation     Barriers to Discharge        Equipment Recommendations  Hospital bed    Recommendations for Other Services    Frequency Min 3X/week   Plan Discharge plan remains appropriate;Frequency remains appropriate    Precautions / Restrictions Precautions Precautions: Fall Precaution Comments: obesity., has not ambulated in over a year per her report Restrictions Weight Bearing Restrictions: No   Pertinent Vitals/Pain 6/10 faces scale pain with MB clean-up, RN present and addressed    Mobility  Bed Mobility Bed Mobility: Rolling Right;Rolling Left;Left Sidelying to Sit;Sitting - Scoot to [place] of Bed;Sit to Sidelying Right;Scooting to [facility] Rolling Right: 3: Mod assist;With rail Rolling Left: 3: Mod assist;With rail Left Sidelying to Sit: 1: +2 Total assist;With rails;HOB flat Left Sidelying to Sit: Patient Percentage: 40% Sitting - Scoot to Edge of Bed: 3: Mod assist Sit to Supine: 2: Max assist;HOB flat Scooting to HOB: 1: +2 Total assist Scooting to [facility]: Patient Percentage: 20% Details for Bed Mobility Assistance: pt able to do little to move her legs, especially against gravity. Pt rolled several times to right and left due to being soaked with urine and bowel. RN present and addressed  several open wounds on her buttocks region. This was painful for pt but she did not correlate using the bathroom in the bed to the wounds and the difficulty healing them. Transfers Transfers: Sit to Stand;Stand to Sit Sit to Stand: 1: +2 Total assist;From bed Sit to Stand: Patient Percentage: 20% Stand to Sit: 1: +2 Total assist Stand to Sit: Patient Percentage: 20% Stand Pivot Transfers: Not tested (comment) Transfer via Lift Equipment: Hydrographic surveyor Details for Transfer Assistance: After reading of treatment session yesterday, brought [place] Dec plus for pt, hoping that it would give her more confidence and security as well as prevention of knee buckling. Pt was agreeable to trying to get out of bed to chair. Began sit to stand and pt did not fully clear bed before becoming hysterical again and screaming and crying that she could not do this and her arthritis in her legs does not allow her to stand even with help. She was insistent that she sit back down and not try to get up. Took several minutes for pt to calm back down.  Ambulation/Gait Ambulation/Gait Assistance: Not tested (comment) Stairs: No Wheelchair Mobility Wheelchair Mobility: No    Exercises     PT Diagnosis:    PT Problem List:   PT Treatment Interventions:     PT Goals Acute Rehab PT Goals PT Goal Formulation: With patient Time For Goal Achievement: 09/18/12 Potential to Achieve Goals: Fair Pt will Roll Supine to Right Side: with min assist PT Goal: Rolling Supine to Right Side - Progress: Progressing toward goal Pt will Roll Supine to Left Side: with  min assist PT Goal: Rolling Supine to Left Side - Progress: Progressing toward goal Pt will go Supine/Side to Sit: with max assist PT Goal: Supine/Side to Sit - Progress: Progressing toward goal Pt will go Sit to Supine/Side: with mod assist PT Goal: Sit to Supine/Side - Progress: Progressing toward goal Pt will go Sit to Stand: with +2 total assist (pt 60%) PT Goal: Sit to  Stand - Progress: Not progressing Pt will go Stand to Sit: with +2 total assist (pt 60%) PT Goal: Stand to Sit - Progress: Not progressing Pt will Transfer Bed to Chair/Chair to Bed: with +2 total assist (pt 60%) PT Transfer Goal: Bed to Chair/Chair to Bed - Progress: Not progressing PT Goal: Ambulate - Progress: Discontinued (comment)  Visit Information  Last PT Received On: 09/05/12 Assistance Needed: +2    Subjective Data  Subjective: pt very lethargic, difficult to arouse Patient Stated Goal: pt stating she wants to return home    Cognition  Cognition Overall Cognitive Status: Impaired Area of Impairment: Memory;Safety/judgement;Awareness of deficits Arousal/Alertness: Lethargic Orientation Level: Disoriented to;Time Behavior During Session: Anxious Memory Deficits: poor historian Following Commands: Follows one step commands consistently Safety/Judgement: Decreased awareness of need for assistance Awareness of Deficits: does not understand the extent to which she is incapacitated and does not understand that in her current state it would be hard for anyone to care for her well at home. Explained implications of wounds on her buttocks and being unable to pivot to [facility] but she did not follow this Cognition - Other Comments: hysterical again with mobility    Balance  Balance Balance Assessed: Yes Static Sitting Balance Static Sitting - Balance Support: No upper extremity supported;Feet unsupported Static Sitting - Level of Assistance: 5: Stand by assistance Static Sitting - Comment/# of Minutes: 15  End of Session PT - End of Session Equipment Utilized During Treatment: Gait belt Activity Tolerance: Treatment limited secondary to agitation Patient left: in bed;with call bell/phone within reach Nurse Communication: Mobility status   GP   Lyanne Co, PT  Acute Rehab Services  [phone]   Lyanne Co 09/05/2012, 10:10 AM

## 2012-09-05 NOTE — Discharge Summary (Signed)
  Discharge summary dictated on 09/05/2012 dictation number is [phone]

## 2012-09-07 LAB — CULTURE, BLOOD (ROUTINE X 2): Culture: NO GROWTH

## 2012-09-10 NOTE — Clinical Social Work Psychosocial (Signed)
     Clinical Social Work Department BRIEF PSYCHOSOCIAL ASSESSMENT 09/05/2012  Patient:  [name], [name]     Account Number:  [id number]     Admit date:  08/31/2012  Clinical Social Worker:  Tiburcio Pea  Date/Time:  09/04/2012 06:30 PM  Referred by:  Physician  Date Referred:  09/03/2012 Referred for  SNF Placement   Other Referral:   Interview type:  Patient Other interview type:    PSYCHOSOCIAL DATA Living Status:  FRIEND(S) Admitted from facility:   Level of care:   Primary support name:  [name]    [id number] Primary support relationship to patient:  FRIEND Degree of support available:   Fair    Patient has difficulty explaining relationship with [name]    CURRENT CONCERNS Current Concerns  Behavioral Health Issues  Post-Acute Placement   Other Concerns:    SOCIAL WORK ASSESSMENT / PLAN CSW met with patient- she was admitted from home but has had recent admission to [facility]. CSW reviewed prior records from [facility] and patient states that she "hated" the nursing home and will never go back there. CSW offered to locate another facility but she adamantly refused stating "I'm going home."  PT is recommending SNF placement as she is not able to ambulate. She currently lives at home with a friend named [name] who has related to MD and other staff that he is not able to adequately care for her.  CSW discussed with patient's nurse and recommended that MD order a psych consult for capacity.  If she is deemed to have capacity- will arrange for home via EMS and RNCM will arrange for Home Health services.  CSW will consider APS referral if she goes home due to above information.  Patient has  past and current mental health issues.   Assessment/plan status:  Psychosocial Support/Ongoing Assessment of Needs Other assessment/ plan:   Information/referral to community resources:   SNF bed list offered and refused  She states she is followed for  psych services at [facility] - for her medications    PATIENTS/FAMILYS RESPONSE TO PLAN OF CARE: Patient is alert and was able to respond verbally to CSW during visit. She became somewhat anxious when discussion of SNF placement was brought up and her speech became quite rapid. CSW offered support and attempted to calm paitent down. Patient refused any placment possibilities.  CSW discussed with Antony Contras, Psych Service Line CSW who will evaluate patient along with the psychiatrist.  CSW will monitor.

## 2012-09-13 IMAGING — CT CT HEAD W/O CM
2 series · 15 of 30 positions shown, 19 images · non-contrast
Comparison: CT of the head performed 09/16/2010

CLINICAL DATA: Hallucinations; schizoaffective disorder.
Psychiatric evaluation.  History of diabetes.

CT HEAD WITHOUT CONTRAST
TECHNIQUE: Contiguous axial images were obtained from the base of
the skull through the vertex without contrast.

[Series 2: head w/o · axial · non-contrast · 0.43mm/px · z∈[+1379,+1499]mm · 13 of 29 slices shown, 17 images]
[im 3/29  brain]
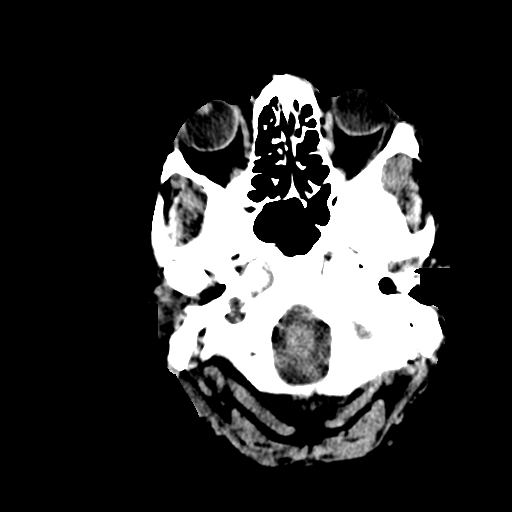
[im 3/29  bone]
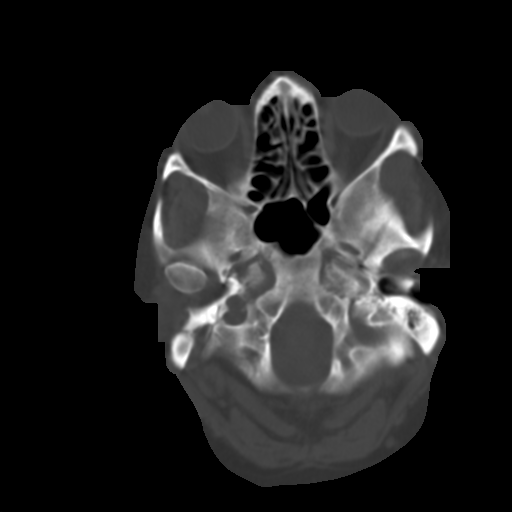
[im 5/29  brain]
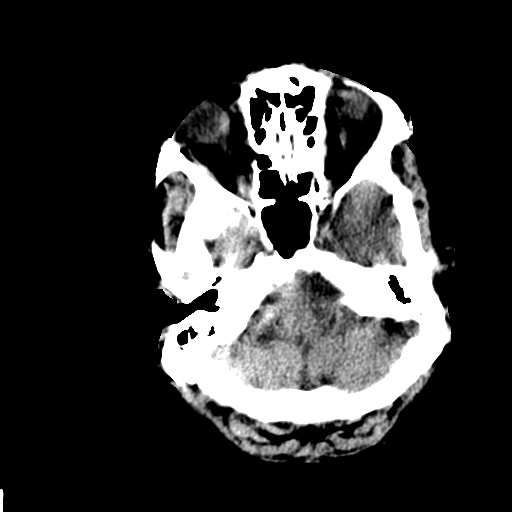
[im 7/29  brain]
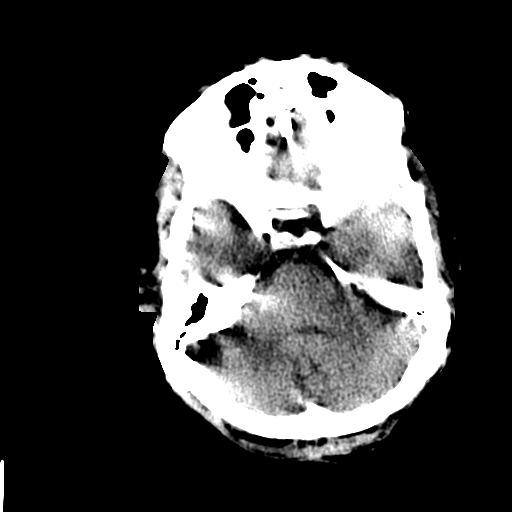
[im 9/29  brain]
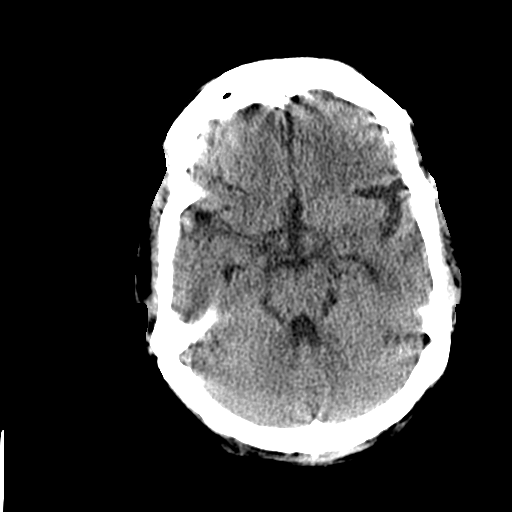
[im 11/29  brain]
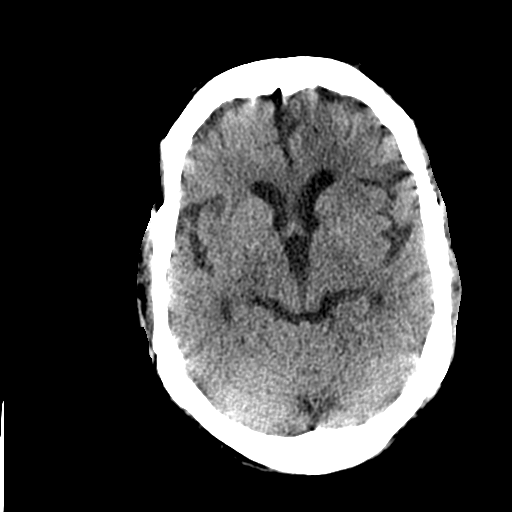
[im 11/29  bone]
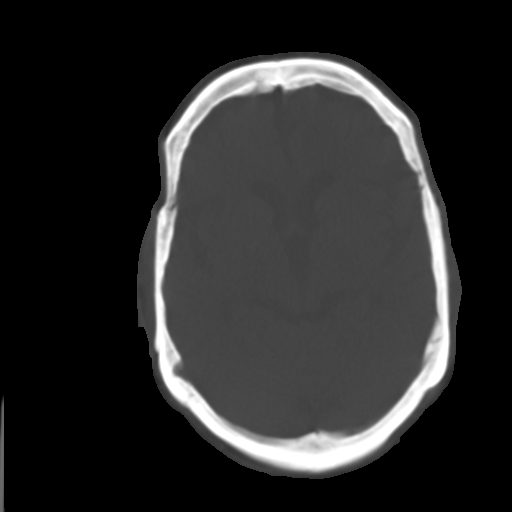
[im 13/29  brain]
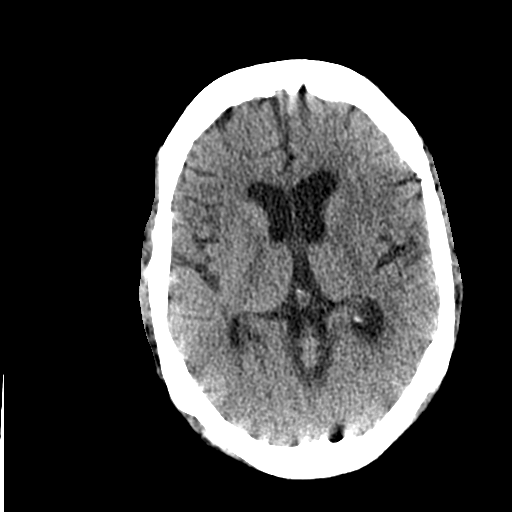
[im 15/29  brain]
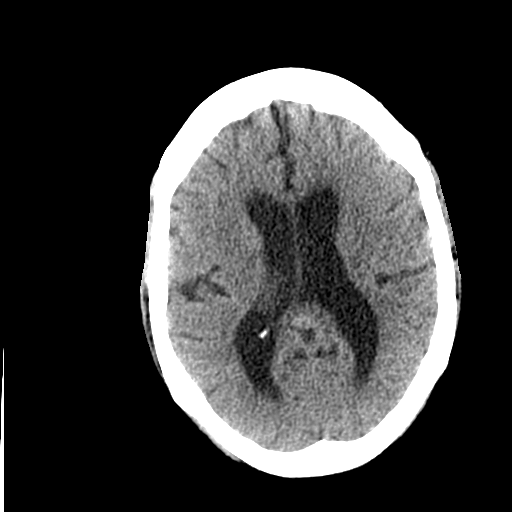
[im 17/29  brain]
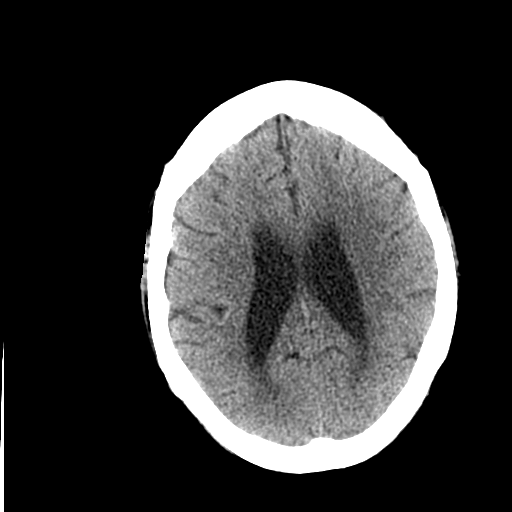
[im 19/29  brain]
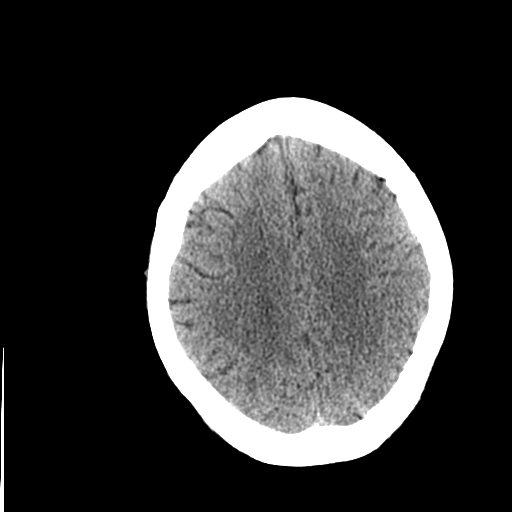
[im 19/29  bone]
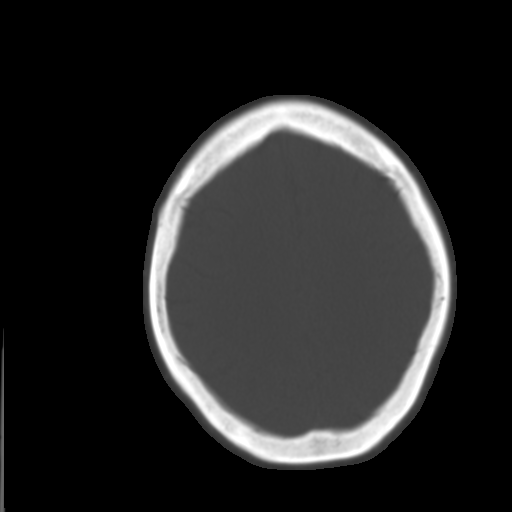
[im 21/29  brain]
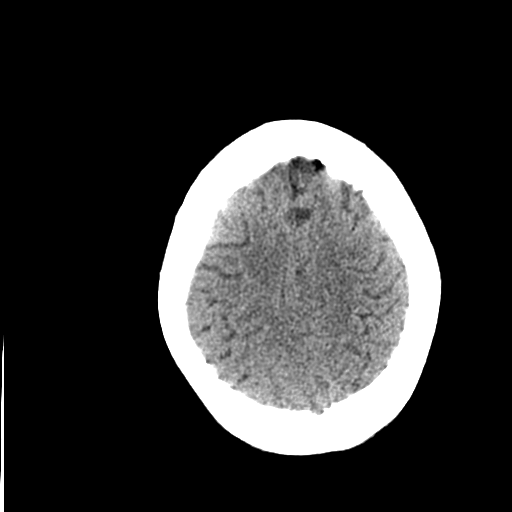
[im 23/29  brain]
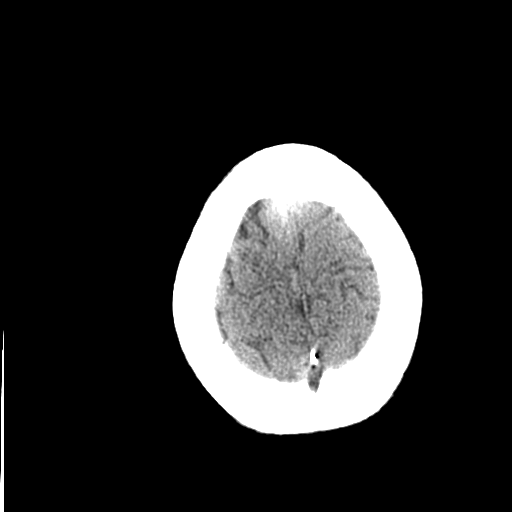
[im 25/29  brain]
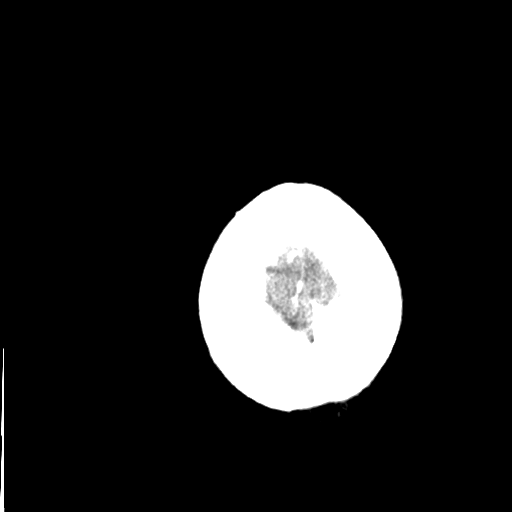
[im 27/29  brain]
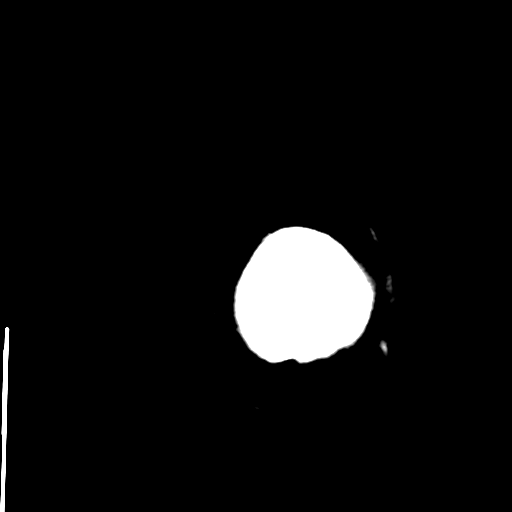
[im 27/29  bone]
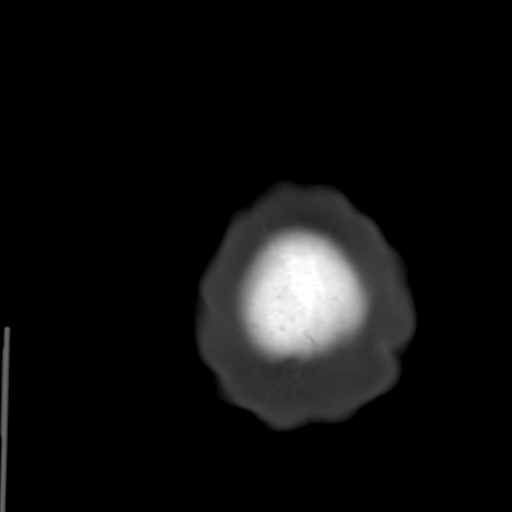

[Series 3: bone windows · axial · 0.43mm/px · z∈[+1379,+1399]mm · 2 of 29 slices shown]
[im 3/29  bone]
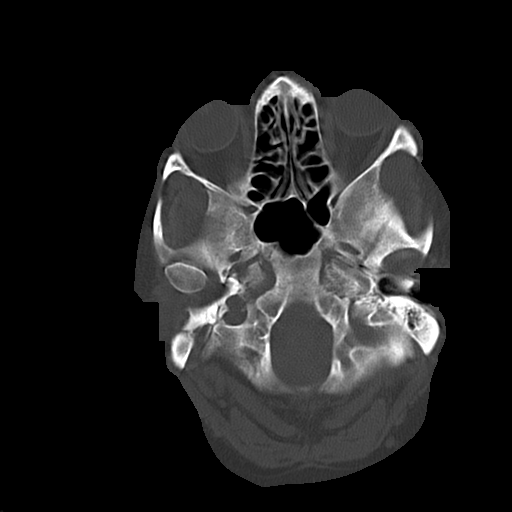
[im 7/29  bone]
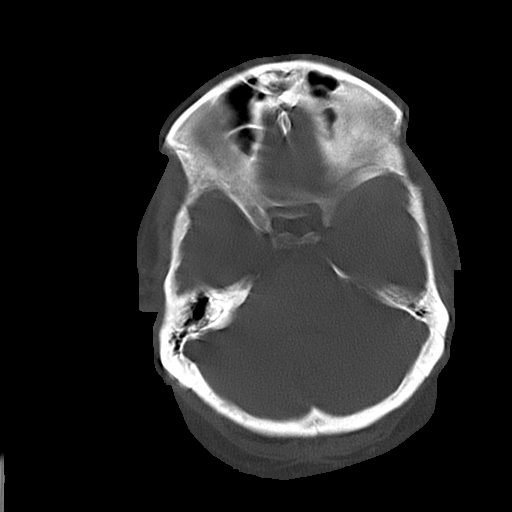

[15 of 30 positions shown; findings below may reference images not displayed]

FINDINGS: There is no evidence of acute infarction, mass lesion, or
intra- or extra-axial hemorrhage on CT.  Evaluation is suboptimal
due to beam hardening artifact.

Prominence of the ventricles and sulci reflects mild cortical
volume loss.

The posterior fossa, including the cerebellum, brainstem and fourth
ventricle, is within normal limits.  The basal ganglia are
unremarkable in appearance.  The cerebral hemispheres are symmetric
in appearance, with normal gray-white differentiation.  No mass
effect or midline shift is seen.

There is no evidence of fracture; visualized osseous structures are
unremarkable in appearance.  The visualized portions of the orbits
are within normal limits.  The paranasal sinuses and mastoid air
cells are well-aerated.  No significant soft tissue abnormalities
are seen.
IMPRESSION: 1.  No acute intracranial pathology seen on CT.
2.  Mild cortical volume loss.

## 2012-09-13 IMAGING — CR DG CHEST 1V PORT
1 series · 1 of 1 positions shown · non-contrast
Comparison: 04/25/2011

CLINICAL DATA: Confusion.

PORTABLE CHEST - 1 VIEW

[AP]
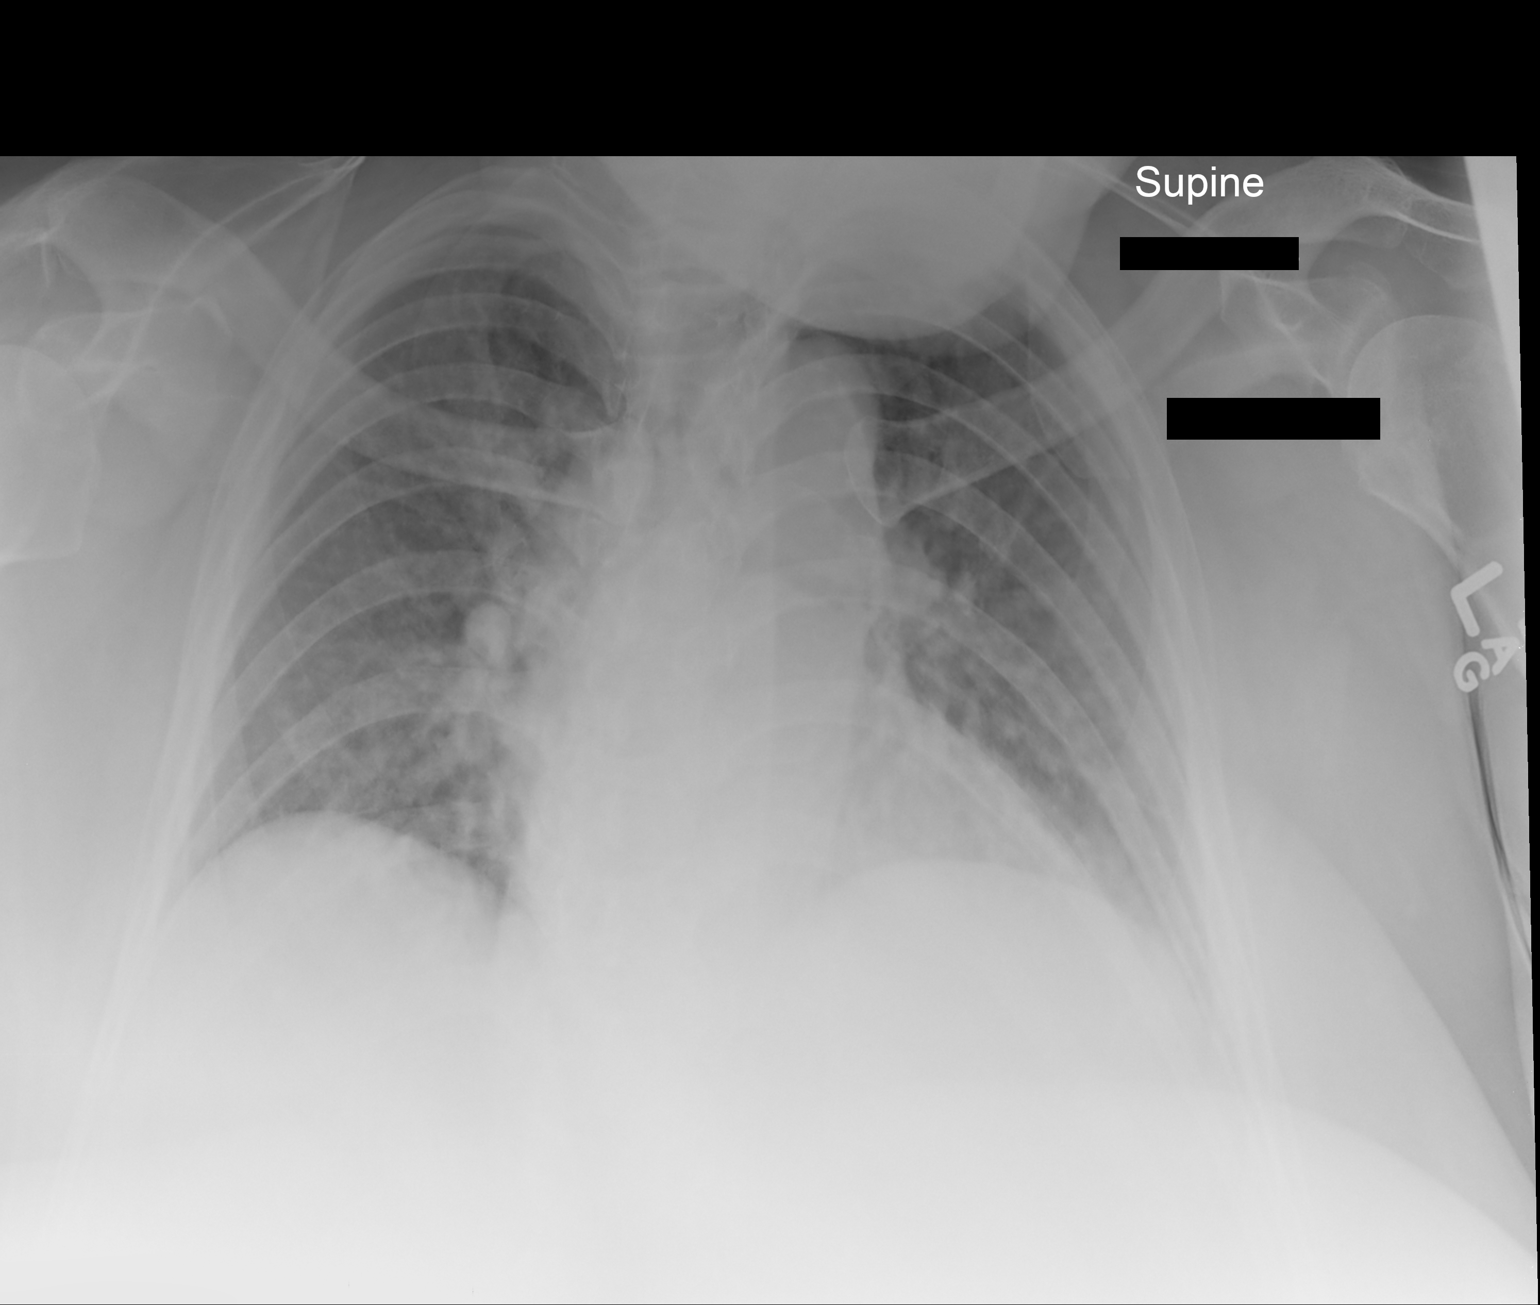

[1 of 1 positions shown; findings below may reference images not displayed]

FINDINGS: Lung volumes are very low.  Despite lower volumes, some
degree of pulmonary edema/CHF is suspected.  Heart remains
enlarged.  No obvious pleural fluid.
IMPRESSION: Low lung volumes with suspicion of new pulmonary edema/CHF.

## 2012-09-20 ENCOUNTER — Encounter (HOSPITAL_COMMUNITY): Payer: Self-pay | Admitting: *Deleted

## 2012-09-20 ENCOUNTER — Emergency Department (HOSPITAL_COMMUNITY): Payer: Medicare Other

## 2012-09-20 ENCOUNTER — Observation Stay (HOSPITAL_COMMUNITY)
Admission: EM | Admit: 2012-09-20 | Discharge: 2012-09-21 | Disposition: A | Discharge status: Home / Self Care | Payer: Medicare Other | Attending: Cardiology | Admitting: Cardiology

## 2012-09-20 DIAGNOSIS — Z6841 Body Mass Index (BMI) 40.0 and over, adult: Secondary | ICD-10-CM | POA: Insufficient documentation

## 2012-09-20 DIAGNOSIS — R52 Pain, unspecified: Secondary | ICD-10-CM | POA: Insufficient documentation

## 2012-09-20 DIAGNOSIS — J449 Chronic obstructive pulmonary disease, unspecified: Secondary | ICD-10-CM | POA: Insufficient documentation

## 2012-09-20 DIAGNOSIS — K449 Diaphragmatic hernia without obstruction or gangrene: Secondary | ICD-10-CM | POA: Insufficient documentation

## 2012-09-20 DIAGNOSIS — R1084 Generalized abdominal pain: Secondary | ICD-10-CM | POA: Insufficient documentation

## 2012-09-20 DIAGNOSIS — N390 Urinary tract infection, site not specified: Principal | ICD-10-CM | POA: Insufficient documentation

## 2012-09-20 DIAGNOSIS — F411 Generalized anxiety disorder: Secondary | ICD-10-CM | POA: Insufficient documentation

## 2012-09-20 DIAGNOSIS — I5030 Unspecified diastolic (congestive) heart failure: Secondary | ICD-10-CM | POA: Insufficient documentation

## 2012-09-20 DIAGNOSIS — J4489 Other specified chronic obstructive pulmonary disease: Secondary | ICD-10-CM | POA: Insufficient documentation

## 2012-09-20 DIAGNOSIS — E662 Morbid (severe) obesity with alveolar hypoventilation: Secondary | ICD-10-CM | POA: Insufficient documentation

## 2012-09-20 DIAGNOSIS — R509 Fever, unspecified: Secondary | ICD-10-CM | POA: Insufficient documentation

## 2012-09-20 DIAGNOSIS — IMO0002 Reserved for concepts with insufficient information to code with codable children: Secondary | ICD-10-CM

## 2012-09-20 DIAGNOSIS — I509 Heart failure, unspecified: Secondary | ICD-10-CM | POA: Insufficient documentation

## 2012-09-20 DIAGNOSIS — I10 Essential (primary) hypertension: Secondary | ICD-10-CM | POA: Insufficient documentation

## 2012-09-20 DIAGNOSIS — Z87891 Personal history of nicotine dependence: Secondary | ICD-10-CM | POA: Insufficient documentation

## 2012-09-20 DIAGNOSIS — F259 Schizoaffective disorder, unspecified: Secondary | ICD-10-CM | POA: Insufficient documentation

## 2012-09-20 DIAGNOSIS — I4892 Unspecified atrial flutter: Secondary | ICD-10-CM | POA: Insufficient documentation

## 2012-09-20 DIAGNOSIS — G4733 Obstructive sleep apnea (adult) (pediatric): Secondary | ICD-10-CM | POA: Insufficient documentation

## 2012-09-20 LAB — URINALYSIS, ROUTINE W REFLEX MICROSCOPIC
Bilirubin Urine: NEGATIVE
Nitrite: POSITIVE — AB
Specific Gravity, Urine: 1.009 (ref 1.005–1.030)
Urobilinogen, UA: 1 mg/dL (ref 0.0–1.0)
pH: 6.5 (ref 5.0–8.0)

## 2012-09-20 LAB — CBC WITH DIFFERENTIAL/PLATELET
Basophils Relative: 0 % (ref 0–1)
Hemoglobin: 13.9 g/dL (ref 12.0–15.0)
MCHC: 34.8 g/dL (ref 30.0–36.0)
Monocytes Relative: 7 % (ref 3–12)
Neutro Abs: 6.8 10*3/uL (ref 1.7–7.7)
Neutrophils Relative %: 68 % (ref 43–77)
Platelets: 256 10*3/uL (ref 150–400)
RBC: 4.15 MIL/uL (ref 3.87–5.11)

## 2012-09-20 LAB — BASIC METABOLIC PANEL
BUN: 12 mg/dL (ref 6–23)
Chloride: 99 mEq/L (ref 96–112)
GFR calc Af Amer: >90 mL/min (ref >90)
GFR calc non Af Amer: >90 mL/min (ref >90)
Potassium: 5.7 mEq/L — ABNORMAL HIGH (ref 3.5–5.1)
Sodium: 134 mEq/L — ABNORMAL LOW (ref 135–145)

## 2012-09-20 LAB — URINE MICROSCOPIC-ADD ON

## 2012-09-20 MED ORDER — SODIUM CHLORIDE 0.9 % IV SOLN
INTRAVENOUS | Status: DC
  Administered 2012-09-20: 16:00:00 via INTRAVENOUS

## 2012-09-20 MED ORDER — SODIUM CHLORIDE 0.9 % IV SOLN
INTRAVENOUS | Status: DC
  Administered 2012-09-20: 1000 mL via INTRAVENOUS

## 2012-09-20 MED ORDER — HEPARIN SODIUM (PORCINE) 5000 UNIT/ML IJ SOLN
5000.0000 [IU] | Freq: Three times a day (TID) | INTRAMUSCULAR | Status: DC
  Administered 2012-09-21: 5000 [IU] via SUBCUTANEOUS
  Filled 2012-09-20 (×4): qty 1

## 2012-09-20 MED ORDER — ONDANSETRON HCL 4 MG/2ML IJ SOLN
4.0000 mg | Freq: Once | INTRAMUSCULAR | Status: AC
  Administered 2012-09-20: 4 mg via INTRAVENOUS
  Filled 2012-09-20: qty 2

## 2012-09-20 MED ORDER — PANTOPRAZOLE SODIUM 40 MG PO TBEC
40.0000 mg | DELAYED_RELEASE_TABLET | Freq: Every day | ORAL | Status: DC
  Administered 2012-09-21: 40 mg via ORAL
  Filled 2012-09-20: qty 1

## 2012-09-20 MED ORDER — NITROGLYCERIN 0.4 MG SL SUBL: 0.4000 mg | SUBLINGUAL_TABLET | SUBLINGUAL | Status: DC | PRN

## 2012-09-20 MED ORDER — MUPIROCIN 2 % EX OINT
1.0000 "application " | TOPICAL_OINTMENT | Freq: Two times a day (BID) | CUTANEOUS | Status: DC
  Administered 2012-09-20 – 2012-09-21 (×2): 1 via TOPICAL
  Filled 2012-09-20: qty 22

## 2012-09-20 MED ORDER — ADULT MULTIVITAMIN W/MINERALS CH
1.0000 | ORAL_TABLET | Freq: Every morning | ORAL | Status: DC
  Administered 2012-09-21: 1 via ORAL
  Filled 2012-09-20: qty 1

## 2012-09-20 MED ORDER — POLYETHYLENE GLYCOL 3350 17 G PO PACK
17.0000 g | PACK | ORAL | Status: DC
  Filled 2012-09-20: qty 1

## 2012-09-20 MED ORDER — ALBUTEROL SULFATE HFA 108 (90 BASE) MCG/ACT IN AERS
2.0000 | INHALATION_SPRAY | Freq: Four times a day (QID) | RESPIRATORY_TRACT | Status: DC | PRN
  Filled 2012-09-20: qty 6.7

## 2012-09-20 MED ORDER — ASPIRIN EC 81 MG PO TBEC: 81.0000 mg | DELAYED_RELEASE_TABLET | Freq: Every morning | ORAL | Status: DC

## 2012-09-20 MED ORDER — PALIPERIDONE ER 3 MG PO TB24
3.0000 mg | ORAL_TABLET | Freq: Every day | ORAL | Status: DC
  Administered 2012-09-21: 3 mg via ORAL
  Filled 2012-09-20: qty 1

## 2012-09-20 MED ORDER — LEVOTHYROXINE SODIUM 150 MCG PO TABS
150.0000 ug | ORAL_TABLET | Freq: Every day | ORAL | Status: DC
  Administered 2012-09-21: 150 ug via ORAL
  Filled 2012-09-20 (×2): qty 1

## 2012-09-20 MED ORDER — BUDESONIDE 0.25 MG/2ML IN SUSP
0.2500 mg | Freq: Two times a day (BID) | RESPIRATORY_TRACT | Status: DC
  Filled 2012-09-20: qty 2

## 2012-09-20 MED ORDER — DEXTROSE 5 % IV SOLN
1.0000 g | INTRAVENOUS | Status: DC
  Filled 2012-09-20: qty 10

## 2012-09-20 MED ORDER — DOCUSATE SODIUM 100 MG PO CAPS
100.0000 mg | ORAL_CAPSULE | Freq: Two times a day (BID) | ORAL | Status: DC
  Administered 2012-09-21: 100 mg via ORAL
  Filled 2012-09-20 (×2): qty 1

## 2012-09-20 MED ORDER — DEXTROSE 5 % IV SOLN
1.0000 g | Freq: Once | INTRAVENOUS | Status: AC
  Administered 2012-09-20: 1 g via INTRAVENOUS
  Filled 2012-09-20: qty 10

## 2012-09-20 MED ORDER — ASPIRIN EC 81 MG PO TBEC
81.0000 mg | DELAYED_RELEASE_TABLET | Freq: Every day | ORAL | Status: DC
  Administered 2012-09-21: 81 mg via ORAL
  Filled 2012-09-20: qty 1

## 2012-09-20 NOTE — ED Notes (Addendum)
Pt reports feeling extremely anxious and having no appetite for several days.  Pt reports chest pain 10/10. Pt tearful and wants to see her cardiologist.  Pt feels like she is so sick but cannot express why she feels this way.  Pt reports she cannot walk but doesn't know why she cannot walk.   Pt alert oriented X4

## 2012-09-20 NOTE — H&P (Signed)
[name] is an 62 y.o. female.   Chief Complaint: Generalized weakness chills poor appetite and dysuria HPI: Patient is 62 year old female with past medical history significant for multiple medical problems i.e. hypertension, hypothyroidism, COPD, history of congestive heart failure secondary to diastolic dysfunction in the past, morbid obesity, obesity hypoventilation syndrome, obstructive sleep apnea, history of schizoaffective disorder, history of UTI, GERD, hypothyroidism, history of paroxysmal atrial flutter, history of tobacco abuse, history of seizure disorder, history of hiatus hernia, recently discharged from the hospital came to the ER complaining of generalized weakness chills poor appetite and dysuria for last few days. Patient states she cannot take care of herself at home and is now willing to go back to skilled nursing facility. Patient denies any anginal chest pain but complains of vague musculoskeletal pain. Denies any palpitation lightheadedness or syncope. Denies any history of PND orthopnea or leg swelling. Patient complains of dysuria and  frequency of urination and was noted to have UTI.  Past Medical History  Diagnosis Date  . Schizoaffective disorder   . Urinary tract infection   . Hypertension   . GERD (gastroesophageal reflux disease)   . Hypothyroidism   . Obesity   . Asthma   . COPD (chronic obstructive pulmonary disease)   . CHF (congestive heart failure)   . Seizures   . Morbid obesity   . Chronic pain   . Hypercholesteremia   . Anginal pain   . Myocardial infarction 2002  . Pneumonia     "several times" (05/23/2012)  . Diabetes mellitus     "I'm not diabetic anymore" (05/23/2012)  . Anemia   . History of blood transfusion 1993    "when I had my hysterectomy" (05/23/2012)  . H/O hiatal hernia   . Daily headache   . Arthritis     "severe; all over my body" (05/23/2012)  . Hypoventilation syndrome     [name] 05/30/2012  . Atrial fibrillation   .  Atrial flutter, paroxysmal     [name] 05/30/2012  . Exertional dyspnea   . Shortness of breath     "all the time lately" (05/30/2012)    Past Surgical History  Procedure Laterality Date  . Vaginal hysterectomy  1993  . Tubal ligation  1998  . Cholecystectomy  ?2008  . Cardiac catheterization  2008    Family History  Problem Relation Age of Onset  . Heart disease Father   . Cancer Mother 61    Breast   Social History:  reports that she quit smoking about 12 years ago. Her smoking use included Cigarettes. She has a 24 pack-year smoking history. Her smokeless tobacco use includes Snuff and Chew. She reports that she does not drink alcohol or use illicit drugs.  Allergies:  Allergies  Allergen Reactions  . Food Rash    "lasagna"= rash from the noodles  . Penicillins Hives  . Sulfamethoxazole W-Trimethoprim Hives and Nausea And Vomiting     (Not in a hospital admission)  Results for orders placed during the hospital encounter of 09/20/12 (from the past 48 hour(s))  CBC WITH DIFFERENTIAL     Status: Abnormal   Collection Time    09/20/12  3:29 PM      Result Value Range   WBC 10.0  4.0 - 10.5 K/uL   RBC 4.15  3.87 - 5.11 MIL/uL   Hemoglobin 13.9  12.0 - 15.0 g/dL   HCT 40.9  81.1 - 91.4 %   MCV 96.1  78.0 - 100.0  fL   MCH 33.5  26.0 - 34.0 pg   MCHC 34.8  30.0 - 36.0 g/dL   RDW 16.1 (*) 09.6 - 04.5 %   Platelets 256  150 - 400 K/uL   Neutrophils Relative 68  43 - 77 %   Neutro Abs 6.8  1.7 - 7.7 K/uL   Lymphocytes Relative 24  12 - 46 %   Lymphs Abs 2.4  0.7 - 4.0 K/uL   Monocytes Relative 7  3 - 12 %   Monocytes Absolute 0.7  0.1 - 1.0 K/uL   Eosinophils Relative 1  0 - 5 %   Eosinophils Absolute 0.1  0.0 - 0.7 K/uL   Basophils Relative 0  0 - 1 %   Basophils Absolute 0.0  0.0 - 0.1 K/uL  BASIC METABOLIC PANEL     Status: Abnormal   Collection Time    09/20/12  3:29 PM      Result Value Range   Sodium 134 (*) 135 - 145 mEq/L   Potassium 5.7 (*) 3.5 - 5.1  mEq/L   Comment: HEMOLYSIS AT THIS LEVEL MAY AFFECT RESULT   Chloride 99  96 - 112 mEq/L   CO2 28  19 - 32 mEq/L   Glucose, Bld 101 (*) 70 - 99 mg/dL   BUN 12  6 - 23 mg/dL   Creatinine, Ser 4.09  0.50 - 1.10 mg/dL   Calcium 9.9  8.4 - 81.1 mg/dL   GFR calc non Af Amer >90  >90 mL/min   GFR calc Af Amer >90  >90 mL/min   Comment:            The eGFR has been calculated     using the CKD EPI equation.     This calculation has not been     validated in all clinical     situations.     eGFR's persistently     <90 mL/min signify     possible Chronic Kidney Disease.  URINALYSIS, ROUTINE W REFLEX MICROSCOPIC     Status: Abnormal   Collection Time    09/20/12  5:41 PM      Result Value Range   Color, Urine YELLOW  YELLOW   APPearance CLOUDY (*) CLEAR   Specific Gravity, Urine 1.009  1.005 - 1.030   pH 6.5  5.0 - 8.0   Glucose, UA NEGATIVE  NEGATIVE mg/dL   Hgb urine dipstick MODERATE (*) NEGATIVE   Bilirubin Urine NEGATIVE  NEGATIVE   Ketones, ur NEGATIVE  NEGATIVE mg/dL   Protein, ur NEGATIVE  NEGATIVE mg/dL   Urobilinogen, UA 1.0  0.0 - 1.0 mg/dL   Nitrite POSITIVE (*) NEGATIVE   Leukocytes, UA LARGE (*) NEGATIVE  URINE MICROSCOPIC-ADD ON     Status: Abnormal   Collection Time    09/20/12  5:41 PM      Result Value Range   Squamous Epithelial / LPF RARE  RARE   WBC, UA TOO NUMEROUS TO COUNT  <3 WBC/hpf   RBC / HPF 3-6  <3 RBC/hpf   Bacteria, UA FEW (*) RARE   Dg Chest 2 View  09/20/2012  *RADIOLOGY REPORT*  Clinical Data: Chest pain, shortness of breath, cough  CHEST - 2 VIEW  Comparison: 09/03/2012  Findings: Lungs are clear. No pleural effusion or pneumothorax.  Mild cardiomegaly.  Degenerative changes of the visualized thoracolumbar spine.  IMPRESSION: No evidence of acute cardiopulmonary disease.   Original Report Authenticated By: Charline Bills, M.D.  Review of Systems  Constitutional: Positive for chills and malaise/fatigue.  HENT: Negative for hearing loss.    Eyes: Negative for blurred vision and double vision.  Respiratory: Negative for cough, hemoptysis, sputum production and shortness of breath.   Cardiovascular: Negative for chest pain, palpitations, orthopnea and claudication.  Gastrointestinal: Negative for nausea, vomiting and abdominal pain.  Genitourinary: Positive for dysuria and frequency.  Neurological: Negative for dizziness and headaches.    Blood pressure 117/77, pulse 70, temperature 98.4 F (36.9 C), temperature source Oral, resp. rate 14, SpO2 96.00%. Physical Exam  Constitutional: She is oriented to person, place, and time. She appears well-developed and well-nourished.  HENT:  Head: Normocephalic and atraumatic.  Mouth/Throat: Oropharyngeal exudate: hypothyroidism.  Eyes: Left eye exhibits no discharge. No scleral icterus.  Neck: Normal range of motion. Neck supple. No JVD present. No tracheal deviation present. No thyromegaly present.  Cardiovascular: Normal rate and regular rhythm.  Exam reveals no friction rub.   Murmur heard. Respiratory: Breath sounds normal. No respiratory distress. She has no wheezes. She has no rales.  GI: Soft. Bowel sounds are normal. She exhibits distension. There is no tenderness. There is no rebound.  Musculoskeletal: She exhibits no edema and no tenderness.  Neurological: She is alert and oriented to person, place, and time.     Assessment/Plan Probable UTI Hypertension History of paroxysmal atrial flutter COPD Obstructive sleep apnea/obesity hypoventilation syndrome Morbid obesity History of tobacco abuse Hiatus hernia History of seizure disorder History of schizoaffective disorder Hyperkalemia Plan As per orders OT PT consult Social service consult for possible skilled nursing facility  [facility] N 09/20/2012, 9:22 PM

## 2012-09-20 NOTE — ED Notes (Signed)
Pt vis EMS per ems, pt doesn't feel well and wants to go home.  Pt shaking arms and co chest pain.  Pt states she hasnt eaten in days due to lack of appetit.  Bp 126/80, CBG 100.  Alert oriented X4

## 2012-09-20 NOTE — ED Provider Notes (Signed)
History     CSN: [id number]  Arrival date & time 09/20/12  1421   First MD Initiated Contact with Patient 09/20/12 1513      Chief Complaint  Patient presents with  . Anxiety    (Consider location/radiation/quality/duration/timing/severity/associated sxs/prior treatment) Patient is a 62 y.o. female presenting with anxiety.  Anxiety   Level 5 caveat due to crying Pt with multiple medical and psychiatric problems present via EMS for evaluation of 'not feeling good'. She states she is 'malnurished' because she has been vomiting for over a week and she needs to be admitted. She has diffuse abdominal pain, subjective fevers. She has numerous previous ED visits for various chronic complaints including admission for SOB about 2 weeks ago, advised to go to SNF but she refused. She states now she will go to a facility. She is crying and incoherently moaning for much of the history.   Past Medical History  Diagnosis Date  . Schizoaffective disorder   . Urinary tract infection   . Hypertension   . GERD (gastroesophageal reflux disease)   . Hypothyroidism   . Obesity   . Asthma   . COPD (chronic obstructive pulmonary disease)   . CHF (congestive heart failure)   . Seizures   . Morbid obesity   . Chronic pain   . Hypercholesteremia   . Anginal pain   . Myocardial infarction 2002  . Pneumonia     "several times" (05/23/2012)  . Diabetes mellitus     "I'm not diabetic anymore" (05/23/2012)  . Anemia   . History of blood transfusion 1993    "when I had my hysterectomy" (05/23/2012)  . H/O hiatal hernia   . Daily headache   . Arthritis     "severe; all over my body" (05/23/2012)  . Hypoventilation syndrome     [name] 05/30/2012  . Atrial fibrillation   . Atrial flutter, paroxysmal     [name] 05/30/2012  . Exertional dyspnea   . Shortness of breath     "all the time lately" (05/30/2012)    Past Surgical History  Procedure Laterality Date  . Vaginal hysterectomy  1993  . Tubal  ligation  1998  . Cholecystectomy  ?2008  . Cardiac catheterization  2008    Family History  Problem Relation Age of Onset  . Heart disease Father   . Cancer Mother 72    Breast    History  Substance Use Topics  . Smoking status: Former Smoker -- 2.00 packs/day for 12 years    Types: Cigarettes    Quit date: 06/29/2000  . Smokeless tobacco: Current User    Types: Snuff, Chew     Comment: 05/30/2012 "still have what you gave me last time"  . Alcohol Use: No    OB History   Grav Para Term Preterm Abortions TAB SAB Ect Mult Living                  Review of Systems Unable to assess due to mental status.    Allergies  Food; Penicillins; and Sulfamethoxazole w-trimethoprim  Home Medications   Current Outpatient Rx  Name  Route  Sig  Dispense  Refill  . albuterol (PROVENTIL HFA;VENTOLIN HFA) 108 (90 BASE) MCG/ACT inhaler   Inhalation   Inhale 2 puffs into the lungs every 6 (six) hours as needed for wheezing.         Marland Kitchen aspirin EC 81 MG tablet   Oral   Take 81 mg by mouth every  morning.          . budesonide (PULMICORT) 0.25 MG/2ML nebulizer solution   Nebulization   Take 0.25 mg by nebulization 2 (two) times daily.         . fluticasone (FLONASE) 50 MCG/ACT nasal spray   Nasal   Place 2 sprays into the nose daily as needed. For nasal congestion         . levothyroxine (SYNTHROID, LEVOTHROID) 150 MCG tablet   Oral   Take 150 mcg by mouth every morning. For thyroid disease.         . Multiple Vitamin (MULTIVITAMIN WITH MINERALS) TABS   Oral   Take 1 tablet by mouth every morning. For nutritional supplement.         . mupirocin ointment (BACTROBAN) 2 %   Topical   Apply 1 application topically 2 (two) times daily.   22 g   1   . nitroGLYCERIN (NITROSTAT) 0.4 MG SL tablet   Sublingual   Place 0.4 mg under the tongue every 5 (five) minutes as needed. For chest pain         . omeprazole (PRILOSEC) 20 MG capsule   Oral   Take 20 mg by mouth  every morning.          . paliperidone ([name]) 3 MG 24 hr tablet   Oral   Take 1 tablet (3 mg total) by mouth daily.   30 tablet   3   . polyethylene glycol (MIRALAX / GLYCOLAX) packet   Oral   Take 17 g by mouth every 3 (three) days.          . traMADol (ULTRAM) 50 MG tablet   Oral   Take 50 mg by mouth 4 (four) times daily.            BP 120/95  Pulse 79  Temp(Src) 98.4 F (36.9 C) (Oral)  Resp 14  SpO2 98%  Physical Exam  Nursing note and vitals reviewed. Constitutional: She is oriented to person, place, and time. She appears well-developed and well-nourished.  Morbidly obese  HENT:  Head: Normocephalic and atraumatic.  Eyes: EOM are normal. Pupils are equal, round, and reactive to light.  Neck: Normal range of motion. Neck supple.  Cardiovascular: Normal rate, normal heart sounds and intact distal pulses.   Pulmonary/Chest: Effort normal and breath sounds normal.  Abdominal: Bowel sounds are normal. She exhibits no distension. There is no tenderness.  Musculoskeletal: Normal range of motion. She exhibits no edema and no tenderness.  Neurological: She is alert and oriented to person, place, and time. She has normal strength. No cranial nerve deficit or sensory deficit.  Skin: Skin is warm and dry. No rash noted.  Psychiatric:  Crying, anxious    ED Course  Procedures (including critical care time)  Labs Reviewed  CBC WITH DIFFERENTIAL - Abnormal; Notable for the following:    RDW 16.0 (*)    All other components within normal limits  BASIC METABOLIC PANEL - Abnormal; Notable for the following:    Sodium 134 (*)    Potassium 5.7 (*)    Glucose, Bld 101 (*)    All other components within normal limits  URINALYSIS, ROUTINE W REFLEX MICROSCOPIC - Abnormal; Notable for the following:    APPearance CLOUDY (*)    Hgb urine dipstick MODERATE (*)    Nitrite POSITIVE (*)    Leukocytes, UA LARGE (*)    All other components within normal limits  URINE  MICROSCOPIC-ADD  ON - Abnormal; Notable for the following:    Bacteria, UA FEW (*)    All other components within normal limits  URINE CULTURE   No results found.   1. UTI (lower urinary tract infection)   2. Failure to thrive       MDM  Pt advised multiple times that I would be glad to evaluate her complaints but that I would not guarantee that she will require admission until her workup was completed.   Date: 09/20/2012  Rate: 83  Rhythm: normal sinus rhythm  QRS Axis: normal  Intervals: normal  ST/T Wave abnormalities: normal and lateral artifact  Conduction Disutrbances:none  Narrative Interpretation:  Old EKG Reviewed: unchanged  9:24 PM Delay due to Urine sample lost between ED and Lab. It has not resulted and shows UTI. Discussed with Dr. Sharyn Lull who will admit for Abx and placement in SNF.      Mariadejesus Cade B. Bernette Mayers, MD 09/20/12 2125

## 2012-09-21 LAB — BASIC METABOLIC PANEL
BUN: 9 mg/dL (ref 6–23)
CO2: 26 mEq/L (ref 19–32)
Calcium: 9.5 mg/dL (ref 8.4–10.5)
Creatinine, Ser: 0.65 mg/dL (ref 0.50–1.10)
Glucose, Bld: 90 mg/dL (ref 70–99)
Sodium: 138 mEq/L (ref 135–145)

## 2012-09-21 LAB — CBC
HCT: 37.5 % (ref 36.0–46.0)
Hemoglobin: 11.8 g/dL — ABNORMAL LOW (ref 12.0–15.0)
MCH: 31.1 pg (ref 26.0–34.0)
MCV: 98.9 fL (ref 78.0–100.0)
RBC: 3.79 MIL/uL — ABNORMAL LOW (ref 3.87–5.11)

## 2012-09-21 LAB — GLUCOSE, CAPILLARY: Glucose-Capillary: 91 mg/dL (ref 70–99)

## 2012-09-21 MED ORDER — BUDESONIDE 0.25 MG/2ML IN SUSP
0.2500 mg | Freq: Two times a day (BID) | RESPIRATORY_TRACT | Status: DC
  Administered 2012-09-21: 0.25 mg via RESPIRATORY_TRACT
  Filled 2012-09-21 (×3): qty 2

## 2012-09-21 MED ORDER — LEVOFLOXACIN 750 MG PO TABS: 750.0000 mg | ORAL_TABLET | ORAL | Status: DC

## 2012-09-21 MED ORDER — LEVOFLOXACIN 750 MG PO TABS
750.0000 mg | ORAL_TABLET | ORAL | Status: DC
  Administered 2012-09-21: 750 mg via ORAL
  Filled 2012-09-21: qty 1

## 2012-09-21 NOTE — Progress Notes (Signed)
UR Completed.  [name] [phone] 09/21/2012  

## 2012-09-21 NOTE — Evaluation (Signed)
Occupational Therapy Evaluation Patient Details Name: [name] MRN: [id number] DOB: November 21, 1950 Today's Date: 09/21/2012 Time: 1324-4010 OT Time Calculation (min): 20 min  OT Assessment / Plan / Recommendation Clinical Impression  Pt admitted with UTI with recent admission 2 weeks ago for COPD exacerbation and respiratory insufficiency. Pt has severe anxiety and is self limiting. Will follow acutely to address below problem list. Recommending SNF for d/c plan.    OT Assessment  Patient needs continued OT Services    Follow Up Recommendations  SNF    Barriers to Discharge      Equipment Recommendations   (tbd)    Recommendations for Other Services    Frequency  Min 2X/week    Precautions / Restrictions Precautions Precautions: Fall Precaution Comments: obesity., has not ambulated in over a year per her report Restrictions Weight Bearing Restrictions: No   Pertinent Vitals/Pain C/o stomach pain, unable to rate.    ADL  Grooming: Performed;Brushing hair;Minimal assistance Where Assessed - Grooming: Unsupported sitting Upper Body Bathing: Simulated;Set up Where Assessed - Upper Body Bathing: Unsupported sitting Lower Body Bathing: Simulated;Maximal assistance Where Assessed - Lower Body Bathing: Supine, head of bed flat;Rolling right and/or left Upper Body Dressing: Simulated;Set up Where Assessed - Upper Body Dressing: Unsupported sitting Lower Body Dressing: Simulated;+1 Total assistance Where Assessed - Lower Body Dressing: Supine, head of bed flat;Rolling right and/or left Transfers/Ambulation Related to ADLs: OOB transfer not attempted. ADL Comments: Pt sat EOB 7 min and became very anxious and forced dry heaving.       OT Diagnosis: Generalized weakness;Cognitive deficits;Acute pain  OT Problem List: Decreased strength;Decreased activity tolerance;Decreased safety awareness;Decreased cognition;Decreased knowledge of use of DME or AE;Obesity;Pain OT Treatment  Interventions: Self-care/ADL training;Therapeutic activities;Patient/family education;DME and/or AE instruction   OT Goals Acute Rehab OT Goals OT Goal Formulation: Patient unable to participate in goal setting Time For Goal Achievement: 10/05/12 Potential to Achieve Goals: Good ADL Goals Pt Will Perform Lower Body Bathing: with mod assist;with adaptive equipment;Sit to stand from bed ADL Goal: Lower Body Bathing - Progress: Discontinued (comment) Pt Will Perform Lower Body Dressing: with mod assist;Sit to stand from bed;with adaptive equipment ADL Goal: Lower Body Dressing - Progress: Discontinued (comment) Pt Will Transfer to Toilet: with 2+ total assist;Squat pivot transfer;Extra wide 3-in-1 (pt 50%) ADL Goal: Toilet Transfer - Progress: Goal set today Miscellaneous OT Goals Miscellaneous OT Goal #1: Pt will transfer supine to sit EOB with min assist in preparation for selfcare tasks. OT Goal: Miscellaneous Goal #1 - Progress: Goal set today Miscellaneous OT Goal #2: Pt will participate in >10 min of therapeutic activity in order to increase activity tolerance for ADLs. OT Goal: Miscellaneous Goal #2 - Progress: Goal set today Miscellaneous OT Goal #3: Pt will perform sit<>stand transfer with +2 total assist pt=50% as precursor for functional toilet transfer. OT Goal: Miscellaneous Goal #3 - Progress: Goal set today  Visit Information  Last OT Received On: 09/21/12 Assistance Needed: +2 (Simultaneous filing. User may not have seen previous data.) PT/OT Co-Evaluation/Treatment: Yes    Subjective Data      Prior Functioning     Home Living Lives With: Significant other Available Help at Discharge: Friend(s);Available PRN/intermittently Type of Home: Apartment Home Access: Elevator Home Layout: One level Bathroom Shower/Tub: Engineer, manufacturing systems: Standard Home Adaptive Equipment: Walker - rolling;Wheelchair - manual Additional Comments: Pt reports that she slept  in a recliner Prior Function Level of Independence: Needs assistance Needs Assistance: Bathing;Dressing;Toileting;Light Housekeeping;Meal Prep;Transfers Bath: Moderate Dressing:  Moderate Toileting: Maximal Meal Prep: Total Light Housekeeping: Total Transfer Assistance: mod-max for OOB Able to Take Stairs?: No Driving: No Communication Communication: Expressive difficulties (garbled speech at times) Dominant Hand: Right         Vision/Perception     Cognition  Cognition Overall Cognitive Status: Impaired Area of Impairment: Memory;Safety/judgement;Awareness of deficits Arousal/Alertness: Lethargic Orientation Level: Disoriented to;Time Behavior During Session: Anxious Memory Deficits: poor historian Following Commands: Follows one step commands consistently Safety/Judgement: Decreased awareness of need for assistance Awareness of Deficits: does not understand the extent to which she is incapacitated and does not understand that in her current state it would be hard for anyone to care for her well at home. Explained implications of wounds on her buttocks and being unable to pivot to [facility] but she did not follow this Cognition - Other Comments: hysterical again with mobility    Extremity/Trunk Assessment Right Upper Extremity Assessment RUE ROM/Strength/Tone: WFL for tasks assessed RUE Sensation: WFL - Light Touch RUE Coordination: WFL - gross/fine motor Left Upper Extremity Assessment LUE ROM/Strength/Tone: WFL for tasks assessed LUE Sensation: WFL - Light Touch LUE Coordination: WFL - gross/fine motor Right Lower Extremity Assessment RLE ROM/Strength/Tone: Deficits RLE ROM/Strength/Tone Deficits: grossly 2+/5 with limited ROM due to body habitus, requires assist to bend knees in bed but could support weight in standing Left Lower Extremity Assessment LLE ROM/Strength/Tone: Deficits LLE ROM/Strength/Tone Deficits: grossly 2+/5 with limited ROM due to body habitus,  requires assist to bend knees in bed but could support weight in standing     Mobility Bed Mobility Bed Mobility: Rolling Right;Rolling Left;Supine to Sit;Sitting - Scoot to [place] of Bed;Sit to Supine;Scooting to [facility] Rolling Right: 5: Supervision Rolling Left: 5: Supervision Supine to Sit: 1: +2 Total assist;HOB elevated Supine to Sit: Patient Percentage: 60% Sitting - Scoot to Edge of Bed: 4: Min assist Sit to Supine: 1: +2 Total assist;HOB flat Sit to Supine: Patient Percentage: 60% Scooting to HOB: 4: Min assist Details for Bed Mobility Assistance: Pt self limiting; states that she can not move her legs, but was able to do so during functional tasks when not prompted     Exercise General Exercises - Lower Extremity Ankle Circles/Pumps: AROM;5 reps;Supine;Both Heel Slides: AROM;Both;5 reps;Supine   Balance Static Sitting Balance Static Sitting - Balance Support: No upper extremity supported;Feet unsupported Static Sitting - Level of Assistance: 5: Stand by assistance Static Sitting - Comment/# of Minutes: 7 minutes; began dry heaving while sitting EOB and demonstrates increased anxiety   End of Session OT - End of Session Activity Tolerance:  (treatment limited due to anxiety ) Patient left: in bed;with call bell/phone within reach Nurse Communication: Mobility status;Need for lift equipment  GO Functional Assessment Tool Used: clinical judgment Functional Limitation: Self care Self Care Current Status (W2956): At least 80 percent but less than 100 percent impaired, limited or restricted Self Care Goal Status (O1308): At least 60 percent but less than 80 percent impaired, limited or restricted  09/21/2012 Cipriano Mile OTR/L Pager [phone] Office [phone]  Cipriano Mile 09/21/2012, 9:44 AM

## 2012-09-21 NOTE — Care Management Note (Unsigned)
    Page 1 of 2   09/21/2012     3:44:47 PM   CARE MANAGEMENT NOTE 09/21/2012  Patient:  [name], [name]   Account Number:  [id number]  Date Initiated:  09/21/2012  Documentation initiated by:  Thersa Mohiuddin  Subjective/Objective Assessment:   PT ADM ON 09/20/12 WITH UTI.  PTA, PT LIVES AT HOME AND IS CARED FOR BY FIANCE, CLARENCE.  SHE IS ACTIVE WITH [facility] FOR HOME CARE.     Action/Plan:   PT NEEDS SNF, AND IS AGREEABLE TO PLACEMENT.  SHE IS OBSERVATION STATUS.  MAY PLACE PT FROM HOME, IF UNABLE TO PLACE FROM HOSPITAL.  CSW FOLLOWING.   Anticipated DC Date:  09/21/2012   Anticipated DC Plan:  HOME W HOME HEALTH SERVICES  In-house referral  Clinical Social Worker      DC Associate Professor  CM consult      [facility] Choice  HOME HEALTH  Resumption Of Svcs/PTA Provider   Choice offered to / List presented to:  C-1 Patient        HH arranged  HH-1 RN  HH-2 PT  HH-3 OT  HH-4 NURSE'S AIDE  HH-6 SOCIAL WORKER      HH agency  [facility]   Status of service:  In process, will continue to follow Medicare Important Message given?   (If response is "NO", the following Medicare IM given date fields will be blank) Date Medicare IM given:   Date Additional Medicare IM given:    Discharge Disposition:  HOME W HOME HEALTH SERVICES  Per UR Regulation:  Reviewed for med. necessity/level of care/duration of stay  If discussed at Long Length of Stay Meetings, dates discussed:    Comments:  09/20/12 Karry Barrilleaux,RN,BSN [phone] [facility] FOLLOWING, SHOULD PT BE DC'D HOME PRIOR TO PLACEMENT.

## 2012-09-21 NOTE — Clinical Social Work Note (Signed)
Clinical Social Work Department BRIEF PSYCHOSOCIAL ASSESSMENT 09/21/2012  Patient:  [name], [name]     Account Number:  [id number]     Admit date:  09/20/2012  Clinical Social Worker:  Thomasene Mohair  Date/Time:  09/21/2012 12:00 N  Referred by:  Physician  Date Referred:  09/21/2012 Referred for  SNF Placement   Other Referral:   Interview type:  Patient Other interview type:    PSYCHOSOCIAL DATA Living Status:  FRIEND(S) Admitted from facility:   Level of care:   Primary support name:  [name] Primary support relationship to patient:  FRIEND Degree of support available:   adequate    CURRENT CONCERNS Current Concerns  Post-Acute Placement  Behavioral Health Issues   Other Concerns:    SOCIAL WORK ASSESSMENT / PLAN CSW was referred to Pt to assist with dc. CSW is familiar with Pt from previous admissions over the last few months. Pt has long-term significant other/fiance who tries to assist Pt at home. Pt's health has declined and her mental health has also been more challenging.  Pt has agreed to SNF in the past, her preference is [facility]. Pt needs a private room d/t behavioral disturbances at times.  Pt requesting SNF now but is only agreeable to [facility]. CSW contacted [facility] and there is no bed at this  time. Pt is observation status and medically stable for dc. CSW contacted SNF and they will f/u with Pt on Monday with potential bed availability. Pt is followed with [facility] RN, PT/OT, and SW. Clinical information was sent to SNF.   Assessment/plan status:  No Further Intervention Required Other assessment/ plan:   [name] verbalized agreement to support Pt at home until placement is secured.   Information/referral to community resources:    PATIENT'S/FAMILY'S RESPONSE TO PLAN OF CARE: Pt and Pt's fiance are in agreement to going home until placement is available at SNF.   Frederico Hamman, LCSW [phone]

## 2012-09-21 NOTE — Progress Notes (Signed)
Nutrition Brief Note  Patient identified on the Malnutrition Screening Tool (MST) Report for recent weight loss without trying.  Per records below, patient's weight has fluctuated likely due to fluid given medical hx.  Wt Readings from Last 10 Encounters:  09/20/12 291 lb 3.6 oz (132.1 kg)  09/05/12 308 lb 6.8 oz (139.9 kg)  08/08/12 342 lb 13 oz (155.5 kg)  08/01/12 328 lb 7.8 oz (149 kg)  06/03/12 342 lb 6.4 oz (155.312 kg)  05/25/12 350 lb 14.4 oz (159.167 kg)  03/29/12 341 lb 11.4 oz (155 kg)  01/27/12 360 lb (163.295 kg)  01/19/12 360 lb (163.295 kg)  01/12/12 332 lb 10.8 oz (150.9 kg)    Body mass index is 53.25 kg/(m^2). Patient meets criteria for Obesity Class III based on current BMI.   Current diet order is Heart Healthy, patient is consuming approximately 75% of meals at this time. Labs and medications reviewed.   No nutrition interventions warranted at this time. If nutrition issues arise, please consult RD.   Maureen Chatters, RD, LDN Pager #: [phone] After-Hours Pager #: [phone]

## 2012-09-21 NOTE — Progress Notes (Signed)
[facility]  Patient Status: Active (receiving services up to time of hospitalization)  [facility] is providing the following services: RN, PT, OT, MSW and HHA  If patient discharges after hours, please call [phone].   [facility] 09/21/2012, 10:22 AM

## 2012-09-21 NOTE — Discharge Summary (Signed)
  Discharge summary dictated on 09/21/2012 dictation number is [phone]

## 2012-09-21 NOTE — Progress Notes (Signed)
Subjective:  Patient denies any chest pain or shortness of breath. States appetite remains poor. Urinary symptoms improved  Objective:  Vital Signs in the last 24 hours: Temp:  [97.9 F (36.6 C)-98.4 F (36.9 C)] 97.9 F (36.6 C) (04/04 0425) Pulse Rate:  [65-80] 69 (04/04 0425) Resp:  [10-24] 20 (04/04 0425) BP: (103-143)/(53-110) 103/53 mmHg (04/04 0425) SpO2:  [95 %-100 %] 95 % (04/04 0425) Weight:  [132.1 kg (291 lb 3.6 oz)] 132.1 kg (291 lb 3.6 oz) (04/03 2243)  Intake/Output from previous day:   Intake/Output from this shift: Total I/O In: 360 [P.O.:360] Out: -   Physical Exam: Neck: no adenopathy, no carotid bruit, no JVD and supple, symmetrical, trachea midline Lungs: clear to auscultation bilaterally Heart: regular rate and rhythm, S1, S2 normal, no murmur, click, rub or gallop Abdomen: soft, non-tender; bowel sounds normal; no masses,  no organomegaly Extremities: extremities normal, atraumatic, no cyanosis or edema  Lab Results:  Recent Labs  09/20/12 1529 09/21/12 0525  WBC 10.0 5.7  HGB 13.9 11.8*  PLT 256 205    Recent Labs  09/20/12 1529 09/21/12 0525  NA 134* 138  K 5.7* 3.4*  CL 99 104  CO2 28 26  GLUCOSE 101* 90  BUN 12 9  CREATININE 0.71 0.65   No results found for this basename: TROPONINI, CK, MB,  in the last 72 hours Hepatic Function Panel No results found for this basename: PROT, ALBUMIN, AST, ALT, ALKPHOS, BILITOT, BILIDIR, IBILI,  in the last 72 hours No results found for this basename: CHOL,  in the last 72 hours No results found for this basename: PROTIME,  in the last 72 hours  Imaging: Imaging results have been reviewed and Dg Chest 2 View  09/20/2012  *RADIOLOGY REPORT*  Clinical Data: Chest pain, shortness of breath, cough  CHEST - 2 VIEW  Comparison: 09/03/2012  Findings: Lungs are clear. No pleural effusion or pneumothorax.  Mild cardiomegaly.  Degenerative changes of the visualized thoracolumbar spine.  IMPRESSION: No  evidence of acute cardiopulmonary disease.   Original Report Authenticated By: Charline Bills, M.D.     Cardiac Studies:  Assessment/Plan:  Resolving  UTI  Hypertension  History of paroxysmal atrial flutter  COPD  Obstructive sleep apnea/obesity hypoventilation syndrome  Morbid obesity  History of tobacco abuse  Hiatus hernia  History of seizure disorder  History of schizoaffective disorder  Status post Hyperkalemia Plan Change IV Rocephin to by mouth Levaquin DC IV fluid changed to Hep-Lock Increase ambulation Awaiting skilled nursing facility  LOS: 1 day    Mikella Linsley N 09/21/2012, 12:36 PM

## 2012-09-21 NOTE — Evaluation (Addendum)
Physical Therapy Evaluation Patient Details Name: [name] MRN: [id number] DOB: 1951/04/08 Today's Date: 09/21/2012 Time: 4098-1191 PT Time Calculation (min): 20 min  PT Assessment / Plan / Recommendation Clinical Impression  Pt is a 62 y.o. female who presents with possible UTI, patient has a history of schizoaffective disorder and demonstrates deficits in functional mobility secodnary to pain, weakness, decreased mobility and poor activity tolerance and increased anxiety. Feel patient will benefit from SNF placement to address deficits as indicated. Will follow to facilitate and encourage mobility.     PT Assessment  Patient needs continued PT services    Follow Up Recommendations  SNF;Supervision/Assistance - 24 hour    Does the patient have the potential to tolerate intense rehabilitation      Barriers to Discharge Decreased caregiver support      Equipment Recommendations       Recommendations for Other Services     Frequency Min 2X/week    Precautions / Restrictions Precautions Precautions: Fall Precaution Comments: obesity., has not ambulated in over a year per her report Restrictions Weight Bearing Restrictions: No   Pertinent Vitals/Pain Pain in stomach, no value given      Mobility  Bed Mobility Bed Mobility: Rolling Right;Rolling Left;Supine to Sit;Sitting - Scoot to [place] of Bed;Sit to Supine;Scooting to [facility] Rolling Right: 5: Supervision Rolling Left: 5: Supervision Supine to Sit: 1: +2 Total assist;HOB elevated Supine to Sit: Patient Percentage: 60% Sitting - Scoot to Edge of Bed: 4: Min assist Sit to Supine: 1: +2 Total assist;HOB flat Sit to Supine: Patient Percentage: 60% Scooting to HOB: 4: Min assist Details for Bed Mobility Assistance: Pt self limiting; states that she can not move her legs, but was able to do so during functional tasks when not prompted Transfers Transfers: Not assessed Ambulation/Gait Ambulation/Gait Assistance: Not  tested (comment)    Exercises General Exercises - Lower Extremity Ankle Circles/Pumps: AROM;5 reps;Supine;Both Heel Slides: AROM;Both;5 reps;Supine   PT Diagnosis: Generalized weakness;Altered mental status  PT Problem List: Decreased strength;Decreased range of motion;Decreased activity tolerance;Decreased mobility;Decreased cognition;Obesity;Decreased safety awareness;Decreased skin integrity PT Treatment Interventions: Functional mobility training;Therapeutic activities;Therapeutic exercise;Patient/family education;Cognitive remediation   PT Goals Acute Rehab PT Goals PT Goal Formulation: With patient Time For Goal Achievement: 09/18/12 Potential to Achieve Goals: Fair Pt will Roll Supine to Right Side: with min assist PT Goal: Rolling Supine to Right Side - Progress: Goal set today Pt will Roll Supine to Left Side: with min assist PT Goal: Rolling Supine to Left Side - Progress: Goal set today Pt will go Supine/Side to Sit: with max assist PT Goal: Supine/Side to Sit - Progress: Goal set today Pt will go Sit to Supine/Side: with mod assist PT Goal: Sit to Supine/Side - Progress: Goal set today Pt will go Sit to Stand: with +2 total assist (pt 60%) PT Goal: Sit to Stand - Progress: Goal set today Pt will go Stand to Sit: with +2 total assist (pt 60%) PT Goal: Stand to Sit - Progress: Goal set today Pt will Transfer Bed to Chair/Chair to Bed: with +2 total assist (pt 60%) PT Transfer Goal: Bed to Chair/Chair to Bed - Progress: Goal set today  Visit Information  Last PT Received On: 09/21/12 Assistance Needed: +2 (Simultaneous filing. User may not have seen previous data.)    Subjective Data  Subjective: pt very anxious; reports pain in her stomach Patient Stated Goal: to get some rehab   Prior Functioning  Home Living Lives With: Significant other Available Help at  Discharge: Friend(s);Available PRN/intermittently Type of Home: Apartment Home Access: Elevator Home  Layout: One level Bathroom Shower/Tub: Engineer, manufacturing systems: Standard Home Adaptive Equipment: Walker - rolling;Wheelchair - manual Additional Comments: Pt reports that she slept in a recliner Prior Function Level of Independence: Needs assistance Needs Assistance: Bathing;Dressing;Toileting;Light Housekeeping;Meal Prep;Transfers Bath: Moderate Dressing: Moderate Toileting: Maximal Meal Prep: Total Light Housekeeping: Total Transfer Assistance: mod-max for OOB Able to Take Stairs?: No Driving: No Communication Communication: Expressive difficulties (garbled speech at times) Dominant Hand: Right    Cognition  Cognition Overall Cognitive Status: Impaired Area of Impairment: Memory;Safety/judgement;Awareness of deficits Arousal/Alertness: Lethargic Orientation Level: Disoriented to;Time Behavior During Session: Anxious Memory Deficits: poor historian Following Commands: Follows one step commands consistently Safety/Judgement: Decreased awareness of need for assistance Awareness of Deficits: does not understand the extent to which she is incapacitated and does not understand that in her current state it would be hard for anyone to care for her well at home. Explained implications of wounds on her buttocks and being unable to pivot to [facility] but she did not follow this Cognition - Other Comments: hysterical again with mobility    Extremity/Trunk Assessment Right Upper Extremity Assessment RUE ROM/Strength/Tone: WFL for tasks assessed RUE Sensation: WFL - Light Touch RUE Coordination: WFL - gross/fine motor Left Upper Extremity Assessment LUE ROM/Strength/Tone: WFL for tasks assessed LUE Sensation: WFL - Light Touch LUE Coordination: WFL - gross/fine motor Right Lower Extremity Assessment RLE ROM/Strength/Tone: Deficits RLE ROM/Strength/Tone Deficits: grossly 2+/5 with limited ROM due to body habitus, requires assist to bend knees in bed but could support weight in  standing Left Lower Extremity Assessment LLE ROM/Strength/Tone: Deficits LLE ROM/Strength/Tone Deficits: grossly 2+/5 with limited ROM due to body habitus, requires assist to bend knees in bed but could support weight in standing   Balance Static Sitting Balance Static Sitting - Balance Support: No upper extremity supported;Feet unsupported Static Sitting - Level of Assistance: 5: Stand by assistance Static Sitting - Comment/# of Minutes: 7 minutes; began dry heaving while sitting EOB and demonstrates increased anxiety  End of Session PT - End of Session Activity Tolerance: Patient limited by fatigue;Other (comment) (anxiety) Patient left: in bed;with call bell/phone within reach Nurse Communication: Mobility status  GP     07-Oct-2012 0900  PT G-Codes **NOT FOR INPATIENT CLASS**  Functional Assessment Tool Used clinical judgement  Functional Limitation Mobility: Walking and moving around  Mobility: Walking and Moving Around Current Status (J1914) CL  Mobility: Walking and Moving Around Goal Status (N8295) Alberteen Sam 2012/10/07, 9:38 AM Charlotte Crumb, PT DPT  [phone]

## 2012-09-22 LAB — URINE CULTURE

## 2012-09-22 NOTE — Discharge Summary (Signed)
[name], [name]               ACCOUNT NO.:  [id number]  MEDICAL RECORD NO.:  [id number]  LOCATION:  2019                         FACILITY:  [facility]  PHYSICIAN:  Eduardo Osier. Sharyn Lull, M.D. DATE OF BIRTH:  1950-12-27  DATE OF ADMISSION:  09/20/2012 DATE OF DISCHARGE:  09/21/2012                              DISCHARGE SUMMARY   ADMITTING DIAGNOSES: 1. Probable urinary tract infection. 2. Hypertension. 3. History of paroxysmal atrial flutter. 4. Chronic obstructive pulmonary disease. 5. Obstructive sleep apnea/obesity hypoventilation syndrome. 6. Morbid obesity. 7. History of tobacco abuse. 8. Hiatus hernia. 9. History of questionable seizure disorder. 10.History of schizoaffective disorder. 11.Hyperkalemia.  DISCHARGE DIAGNOSES: 1. Resolving urinary tract infection. 2. Hypertension. 3. History of paroxysmal atrial flutter. 4. Chronic obstructive pulmonary disease. 5. Obstructive sleep apnea/obesity hypoventilation syndrome. 6. Morbid obesity. 7. History of tobacco abuse. 8. Hiatus hernia. 9. History of questionable seizure disorder. 10.History of schizoaffective disorder. 11.Status post hyperkalemia.  DISCHARGE HOME MEDICATIONS: 1. Levofloxacin 750 mg 1 tablet daily for 7 days.  The patient has been advised to continue rest of her medications i.e.  1. Ventolin inhaler 2 puffs every 6 hours as needed. 2. Enteric-coated aspirin 81 mg 1 tablet daily. 3. Pulmicort by nebulizer twice daily as before. 4. Flonase 2 sprays daily as before, levothyroxine 150 mcg daily as     before. 5. Multivitamin 1 tablet daily as before. 6. Bactroban ointment topically twice daily as before. 7. Nitrostat 0.4 mg sublingual use as directed. 8. Omeprazole 20 mg daily. 9. [name] 3 mg daily. 10.MiraLax 17 g by mouth as needed. 11.Tramadol 50 mg 4 times daily as needed for pain.  DIET:  Low salt, low cholesterol.  CONDITION ON DISCHARGE:  Stable.  FOLLOWUP:  Follow up with me in 2 weeks.   The patient will be transferred to [facility] from home on Monday.  Discussed with the patient and her husband; they agree.  CONDITION AT DISCHARGE:  Stable.  BRIEF HISTORY AND HOSPITAL COURSE:  Ms. Person is a 62 year old female with past medical history significant for multiple medical problems, i.e. hypertension, hypothyroidism, COPD, history of congestive heart failure secondary to diastolic dysfunction in the past, morbid obesity, obesity hypoventilation syndrome, obstructive sleep apnea, history of schizoaffective disorder, history of UTI, GERD, hypothyroidism, history of paroxysmal atrial flutter, history of tobacco abuse, history of questionable seizure disorder, hiatus hernia, recently discharged from the hospital.  She came to the ER complaining of generalized weakness, chills, poor appetite, dysuria for last few days.  The patient states she cannot take care of herself at home and now is willing to go back to skilled nursing facility.  The patient denies any anginal chest pain, but complains of vague musculoskeletal pain.  Denies any palpitation, lightheadedness, or syncope.  Denies any history of PND, orthopnea, or leg swelling.  The patient complains of dysuria and frequency of urination, and was noted to have UTI.  PAST MEDICAL HISTORY:  As above.  PAST SURGICAL HISTORY:  She had a vaginal hysterectomy in the past, had tubal ligation in the past, had cholecystectomy in the past, had cardiac cath in 2008, which showed mild coronary artery disease.  PHYSICAL EXAMINATION:  VITAL SIGNS:  Her blood pressure was 117/77, pulse was 70, she was afebrile.  She was afebrile. HEENT:  Conjunctivae were pink. NECK:  Supple.  No JVD. LUNGS:  Clear to auscultation without rhonchi or rales. CARDIOVASCULAR:  S1, S2 was normal.  There was soft systolic murmur. There was no S3 gallop. ABDOMEN:  Soft.  Bowel sounds were present.  Obese nontender. EXTREMITIES:  There was no clubbing,  cyanosis, or edema.  LABORATORY DATA:  Sodium was 134, potassium 5.7.  Repeat potassium is 3.4.  BUN 12, creatinine 0.71, hemoglobin was 13.9, hematocrit 33.9, white count of 10.0.  Repeat hemoglobin is 11.8, hematocrit 37.5, white count of 5.7.  TSH is 2.178.  Urinalysis was positive for nitrites and leukocytes and was cloudy.  Urine culture is still pending.  Chest x-ray showed no evidence of acute cardiopulmonary disease.  BRIEF HOSPITAL COURSE:  The patient was admitted to telemetry unit, was started on IV Rocephin, and urine cultures will sent.  OT/PT consult and Social Service consult was obtained.  The patient requested for [facility].  The bed is still not available, hopefully they will have bed available on Monday.  Discussed with the patient and her husband regarding discharging home and transferring to [facility] on Monday from home to which they agreed.  The patient will be discharged home later this evening and will be followed up in my office in 2 weeks.     Eduardo Osier. Sharyn Lull, M.D.     [facility]/MEDQ  D:  09/21/2012  T:  09/22/2012  Job:  [id number]

## 2012-09-23 ENCOUNTER — Emergency Department (HOSPITAL_COMMUNITY)
Admission: EM | Admit: 2012-09-23 | Discharge: 2012-09-23 | Disposition: A | Discharge status: Home / Self Care | Payer: Medicare Other | Attending: Emergency Medicine | Admitting: Emergency Medicine

## 2012-09-23 ENCOUNTER — Emergency Department (HOSPITAL_COMMUNITY): Payer: Medicare Other

## 2012-09-23 ENCOUNTER — Encounter (HOSPITAL_COMMUNITY): Payer: Self-pay

## 2012-09-23 DIAGNOSIS — Z8679 Personal history of other diseases of the circulatory system: Secondary | ICD-10-CM | POA: Insufficient documentation

## 2012-09-23 DIAGNOSIS — I252 Old myocardial infarction: Secondary | ICD-10-CM | POA: Insufficient documentation

## 2012-09-23 DIAGNOSIS — J449 Chronic obstructive pulmonary disease, unspecified: Secondary | ICD-10-CM | POA: Insufficient documentation

## 2012-09-23 DIAGNOSIS — R079 Chest pain, unspecified: Secondary | ICD-10-CM | POA: Insufficient documentation

## 2012-09-23 DIAGNOSIS — I509 Heart failure, unspecified: Secondary | ICD-10-CM | POA: Insufficient documentation

## 2012-09-23 DIAGNOSIS — Z862 Personal history of diseases of the blood and blood-forming organs and certain disorders involving the immune mechanism: Secondary | ICD-10-CM | POA: Insufficient documentation

## 2012-09-23 DIAGNOSIS — F259 Schizoaffective disorder, unspecified: Secondary | ICD-10-CM | POA: Insufficient documentation

## 2012-09-23 DIAGNOSIS — Z87891 Personal history of nicotine dependence: Secondary | ICD-10-CM | POA: Insufficient documentation

## 2012-09-23 DIAGNOSIS — N390 Urinary tract infection, site not specified: Secondary | ICD-10-CM | POA: Insufficient documentation

## 2012-09-23 DIAGNOSIS — E039 Hypothyroidism, unspecified: Secondary | ICD-10-CM | POA: Insufficient documentation

## 2012-09-23 DIAGNOSIS — Z9861 Coronary angioplasty status: Secondary | ICD-10-CM | POA: Insufficient documentation

## 2012-09-23 DIAGNOSIS — Z87440 Personal history of urinary (tract) infections: Secondary | ICD-10-CM | POA: Insufficient documentation

## 2012-09-23 DIAGNOSIS — Z79899 Other long term (current) drug therapy: Secondary | ICD-10-CM | POA: Insufficient documentation

## 2012-09-23 DIAGNOSIS — F411 Generalized anxiety disorder: Secondary | ICD-10-CM | POA: Insufficient documentation

## 2012-09-23 DIAGNOSIS — J4489 Other specified chronic obstructive pulmonary disease: Secondary | ICD-10-CM | POA: Insufficient documentation

## 2012-09-23 DIAGNOSIS — I10 Essential (primary) hypertension: Secondary | ICD-10-CM | POA: Insufficient documentation

## 2012-09-23 DIAGNOSIS — M549 Dorsalgia, unspecified: Secondary | ICD-10-CM | POA: Insufficient documentation

## 2012-09-23 DIAGNOSIS — Z8719 Personal history of other diseases of the digestive system: Secondary | ICD-10-CM | POA: Insufficient documentation

## 2012-09-23 DIAGNOSIS — E119 Type 2 diabetes mellitus without complications: Secondary | ICD-10-CM | POA: Insufficient documentation

## 2012-09-23 DIAGNOSIS — Z8639 Personal history of other endocrine, nutritional and metabolic disease: Secondary | ICD-10-CM | POA: Insufficient documentation

## 2012-09-23 DIAGNOSIS — K219 Gastro-esophageal reflux disease without esophagitis: Secondary | ICD-10-CM | POA: Insufficient documentation

## 2012-09-23 DIAGNOSIS — R112 Nausea with vomiting, unspecified: Secondary | ICD-10-CM | POA: Insufficient documentation

## 2012-09-23 DIAGNOSIS — F419 Anxiety disorder, unspecified: Secondary | ICD-10-CM

## 2012-09-23 DIAGNOSIS — Z8669 Personal history of other diseases of the nervous system and sense organs: Secondary | ICD-10-CM | POA: Insufficient documentation

## 2012-09-23 DIAGNOSIS — Z7982 Long term (current) use of aspirin: Secondary | ICD-10-CM | POA: Insufficient documentation

## 2012-09-23 DIAGNOSIS — Z8701 Personal history of pneumonia (recurrent): Secondary | ICD-10-CM | POA: Insufficient documentation

## 2012-09-23 DIAGNOSIS — Z8739 Personal history of other diseases of the musculoskeletal system and connective tissue: Secondary | ICD-10-CM | POA: Insufficient documentation

## 2012-09-23 LAB — URINALYSIS, ROUTINE W REFLEX MICROSCOPIC
Bilirubin Urine: NEGATIVE
Ketones, ur: NEGATIVE mg/dL
Specific Gravity, Urine: 1.016 (ref 1.005–1.030)
Urobilinogen, UA: 1 mg/dL (ref 0.0–1.0)

## 2012-09-23 LAB — CBC
MCH: 33.8 pg (ref 26.0–34.0)
MCHC: 34.7 g/dL (ref 30.0–36.0)
MCV: 97.3 fL (ref 78.0–100.0)
Platelets: 207 10*3/uL (ref 150–400)
RBC: 4.41 MIL/uL (ref 3.87–5.11)
RDW: 15.9 % — ABNORMAL HIGH (ref 11.5–15.5)

## 2012-09-23 LAB — BASIC METABOLIC PANEL
CO2: 30 mEq/L (ref 19–32)
Calcium: 10.5 mg/dL (ref 8.4–10.5)
Creatinine, Ser: 0.82 mg/dL (ref 0.50–1.10)
GFR calc non Af Amer: 77 mL/min — ABNORMAL LOW (ref >90)
Sodium: 139 mEq/L (ref 135–145)

## 2012-09-23 LAB — POCT I-STAT TROPONIN I: Troponin i, poc: 0 ng/mL (ref 0.00–0.08)

## 2012-09-23 LAB — URINE MICROSCOPIC-ADD ON

## 2012-09-23 MED ORDER — CEFTRIAXONE SODIUM 1 G IJ SOLR
1.0000 g | Freq: Once | INTRAMUSCULAR | Status: AC
  Administered 2012-09-23: 1 g via INTRAMUSCULAR
  Filled 2012-09-23: qty 10

## 2012-09-23 MED ORDER — LORAZEPAM 1 MG PO TABS
1.0000 mg | ORAL_TABLET | Freq: Once | ORAL | Status: AC
  Administered 2012-09-23: 1 mg via ORAL
  Filled 2012-09-23: qty 1

## 2012-09-23 MED ORDER — OXYCODONE-ACETAMINOPHEN 5-325 MG PO TABS
2.0000 | ORAL_TABLET | Freq: Once | ORAL | Status: AC
  Administered 2012-09-23: 2 via ORAL
  Filled 2012-09-23: qty 2

## 2012-09-23 MED ORDER — CEPHALEXIN 500 MG PO CAPS: 500.0000 mg | ORAL_CAPSULE | Freq: Four times a day (QID) | ORAL | Status: DC

## 2012-09-23 MED ORDER — ONDANSETRON 4 MG PO TBDP
4.0000 mg | ORAL_TABLET | Freq: Once | ORAL | Status: AC
  Administered 2012-09-23: 4 mg via ORAL
  Filled 2012-09-23: qty 1

## 2012-09-23 NOTE — ED Notes (Addendum)
Pt arrived [facility] from home for chest pain that started this AM while resting.  324 mg ASA given by EMS at 1630 Nitro 0.4 mg SL at 1645

## 2012-09-23 NOTE — ED Provider Notes (Signed)
History     CSN: [id number]  Arrival date & time 09/23/12  1705   First MD Initiated Contact with Patient 09/23/12 1714      Chief Complaint  Patient presents with  . Chest Pain    (Consider location/radiation/quality/duration/timing/severity/associated sxs/prior treatment) HPI Comments: Pt had acute onset of CP that started this morning when she was resting.  She states that the pain is constant for the last 7 AM.  Described as a sharp and stabbing feeling, more intense with pressing on the chest in the sternal area radiating to the R side.  She has associated n/v and back pain assocaited with this.  She states that she is currently being treated for a urinary infection for which she was admitted to the hospital according to the EMR from several days ago.  Last had heart cath in 2007 and had no blockages, has non ischemic CM that was seen on Echo more recently.  Had fever to 101 at home.    Discussed with significant other by phone, he states that she is supposed to go into the nursing home tomorrow, the medical record confirms that the patient is supposed to be placed at the [facility] on Monday morning. He states that she's been having her chronic back pain, chronic leg pain, chronic chest pain, and he felt it would be easier to get her to the nursing home from the hospital instead of from home. He does not give any other significant reason why the patient was transported to the hospital other than ease of placement tomorrow.    Patient is a 62 y.o. female presenting with chest pain. The history is provided by the patient, the EMS personnel and medical records.  Chest Pain   Past Medical History  Diagnosis Date  . Schizoaffective disorder   . Urinary tract infection   . Hypertension   . GERD (gastroesophageal reflux disease)   . Hypothyroidism   . Obesity   . Asthma   . COPD (chronic obstructive pulmonary disease)   . CHF (congestive heart failure)   . Seizures   .  Morbid obesity   . Chronic pain   . Hypercholesteremia   . Anginal pain   . Myocardial infarction 2002  . Pneumonia     "several times" (05/23/2012)  . Diabetes mellitus     "I'm not diabetic anymore" (05/23/2012)  . Anemia   . History of blood transfusion 1993    "when I had my hysterectomy" (05/23/2012)  . H/O hiatal hernia   . Daily headache   . Arthritis     "severe; all over my body" (05/23/2012)  . Hypoventilation syndrome     [name] 05/30/2012  . Atrial fibrillation   . Atrial flutter, paroxysmal     [name] 05/30/2012  . Exertional dyspnea   . Shortness of breath     "all the time lately" (05/30/2012)    Past Surgical History  Procedure Laterality Date  . Vaginal hysterectomy  1993  . Tubal ligation  1998  . Cholecystectomy  ?2008  . Cardiac catheterization  2008    Family History  Problem Relation Age of Onset  . Heart disease Father   . Cancer Mother 42    Breast    History  Substance Use Topics  . Smoking status: Former Smoker -- 2.00 packs/day for 12 years    Types: Cigarettes    Quit date: 06/29/2000  . Smokeless tobacco: Current User    Types: Snuff, Chew  Comment: 05/30/2012 "still have what you gave me last time"  . Alcohol Use: No    OB History   Grav Para Term Preterm Abortions TAB SAB Ect Mult Living                  Review of Systems  Cardiovascular: Positive for chest pain.  All other systems reviewed and are negative.    Allergies  Food; Penicillins; and Sulfamethoxazole w-trimethoprim  Home Medications   Current Outpatient Rx  Name  Route  Sig  Dispense  Refill  . albuterol (PROVENTIL HFA;VENTOLIN HFA) 108 (90 BASE) MCG/ACT inhaler   Inhalation   Inhale 2 puffs into the lungs every 6 (six) hours as needed for wheezing.         Marland Kitchen aspirin EC 81 MG tablet   Oral   Take 81 mg by mouth every morning.          . budesonide (PULMICORT) 0.25 MG/2ML nebulizer solution   Nebulization   Take 0.25 mg by nebulization 2 (two)  times daily.         . fluticasone (FLONASE) 50 MCG/ACT nasal spray   Nasal   Place 2 sprays into the nose daily as needed. For nasal congestion         . levofloxacin (LEVAQUIN) 750 MG tablet   Oral   Take 1 tablet (750 mg total) by mouth daily.   7 tablet   0   . levothyroxine (SYNTHROID, LEVOTHROID) 150 MCG tablet   Oral   Take 150 mcg by mouth every morning. For thyroid disease.         . metoprolol succinate (TOPROL-XL) 50 MG 24 hr tablet   Oral   Take 50 mg by mouth daily. Take with or immediately following a meal.         . Multiple Vitamin (MULTIVITAMIN WITH MINERALS) TABS   Oral   Take 1 tablet by mouth every morning. For nutritional supplement.         Marland Kitchen omeprazole (PRILOSEC) 20 MG capsule   Oral   Take 20 mg by mouth every morning.          . paliperidone ([name]) 3 MG 24 hr tablet   Oral   Take 3 mg by mouth daily.         . polyethylene glycol (MIRALAX / GLYCOLAX) packet   Oral   Take 17 g by mouth every 3 (three) days.          . traMADol (ULTRAM) 50 MG tablet   Oral   Take 50 mg by mouth 4 (four) times daily.          . cephALEXin (KEFLEX) 500 MG capsule   Oral   Take 1 capsule (500 mg total) by mouth 4 (four) times daily.   40 capsule   0   . nitroGLYCERIN (NITROSTAT) 0.4 MG SL tablet   Sublingual   Place 0.4 mg under the tongue every 5 (five) minutes as needed. For chest pain           BP 110/73  Pulse 82  Temp(Src) 98.9 F (37.2 C) (Oral)  Resp 18  Ht 5\' 2"  (1.575 m)  SpO2 100%  Physical Exam  Nursing note and vitals reviewed. Constitutional: She appears well-developed and well-nourished.  HENT:  Head: Normocephalic and atraumatic.  Mouth/Throat: Oropharynx is clear and moist. No oropharyngeal exudate.  Eyes: Conjunctivae and EOM are normal. Pupils are equal, round, and reactive to light. Right eye  exhibits no discharge. Left eye exhibits no discharge. No scleral icterus.  Neck: Normal range of motion. Neck  supple. No JVD present. No thyromegaly present.  Cardiovascular: Normal rate, regular rhythm, normal heart sounds and intact distal pulses.  Exam reveals no gallop and no friction rub.   No murmur heard. Pulmonary/Chest: Effort normal and breath sounds normal. No respiratory distress. She has no wheezes. She has no rales. She exhibits tenderness ( Diffuse chest wall tenderness focused over the sternum and the right side of the chest wall).  Abdominal: Soft. Bowel sounds are normal. She exhibits no distension and no mass. There is no tenderness.  Musculoskeletal: Normal range of motion. She exhibits no edema and no tenderness.  Bilateral very obese lower extremities, no pitting edema  Lymphadenopathy:    She has no cervical adenopathy.  Neurological: She is alert. Coordination normal.  Mild diffuse tremor, patient appears anxious  Skin: Skin is warm and dry. No rash noted. No erythema.  Psychiatric:  Patient appears anxious, not responding to internal stimuli    ED Course  Procedures (including critical care time)  Labs Reviewed  CBC - Abnormal; Notable for the following:    RDW 15.9 (*)    All other components within normal limits  BASIC METABOLIC PANEL - Abnormal; Notable for the following:    Potassium 3.3 (*)    Glucose, Bld 111 (*)    GFR calc non Af Amer 77 (*)    GFR calc Af Amer 89 (*)    All other components within normal limits  URINALYSIS, ROUTINE W REFLEX MICROSCOPIC - Abnormal; Notable for the following:    APPearance TURBID (*)    Hgb urine dipstick MODERATE (*)    Protein, ur 100 (*)    Leukocytes, UA LARGE (*)    All other components within normal limits  URINE MICROSCOPIC-ADD ON - Abnormal; Notable for the following:    Squamous Epithelial / LPF MANY (*)    Bacteria, UA MANY (*)    Casts HYALINE CASTS (*)    All other components within normal limits  URINE CULTURE  POCT I-STAT TROPONIN I   Dg Chest Port 1 View  09/23/2012  *RADIOLOGY REPORT*  Clinical Data:  Chest pain  PORTABLE CHEST - 1 VIEW  Comparison: 09/20/2012  Findings: Normal heart size.  No pleural effusion or edema.  No airspace consolidation identified.  Review of the visualized osseous structures is unremarkable.  IMPRESSION:  1.  No acute cardiopulmonary abnormalities.   Original Report Authenticated By: Signa Kell, M.D.      1. Chest pain   2. UTI (lower urinary tract infection)   3. Anxiety       MDM  The patient appears anxious, her vital signs are unremarkable including a pulse of 95 200, an EKG which is nonischemic and really unchanged from prior EKGs, an examination consistent with having chest wall pain which is chronic for her. At this time and do not see a definite medical indication to admit the patient in the hospital, laboratory workup to ensue to confirm my suspicions, will discuss with patient's primary care Dr.  ED ECG REPORT  I personally interpreted this EKG   Date: 09/23/2012   Rate: 99  Rhythm: normal sinus rhythm Versus rate controlled atrial flutter  QRS Axis: normal  Intervals: normal  ST/T Wave abnormalities: nonspecific T wave changes  Conduction Disutrbances:none  Narrative Interpretation:   Old EKG Reviewed: none available  D/w Dr. Sharyn Lull who agrees that the pt  has [facility] and that she is to go to NH tomorrow - he agrees she has had neg cath's, and stress tests after the cath and he thinks her CP is chronic and atypical.  '  Ativan, Percocet, PO trial, thus far labs unremakable including WBC, Hgb, Trop and CXR.  Pt reevaluated and after pain medicine and ativan, she is very calm and amenable to d/c.  Cultures showed that urine culture was not sensitive to Levaquin - she has been given Rocephin here - she has not had an allergic reaction to this before and based on her admission summary there was no allergy to this while in hospital.  D/w pharmacist who states she has had Rocephin manytimes in the past without reaction.  Home on  keflex.     Vida Roller, MD 09/23/12 2124

## 2012-09-24 LAB — URINE CULTURE: Colony Count: NO GROWTH

## 2012-09-26 ENCOUNTER — Non-Acute Institutional Stay (SKILLED_NURSING_FACILITY): Payer: Medicare Other | Admitting: Internal Medicine

## 2012-09-26 DIAGNOSIS — M545 Low back pain, unspecified: Secondary | ICD-10-CM

## 2012-09-26 DIAGNOSIS — I10 Essential (primary) hypertension: Secondary | ICD-10-CM

## 2012-09-26 DIAGNOSIS — Z8679 Personal history of other diseases of the circulatory system: Secondary | ICD-10-CM

## 2012-09-26 DIAGNOSIS — F250 Schizoaffective disorder, bipolar type: Secondary | ICD-10-CM

## 2012-09-26 DIAGNOSIS — G4700 Insomnia, unspecified: Secondary | ICD-10-CM

## 2012-09-26 DIAGNOSIS — F259 Schizoaffective disorder, unspecified: Secondary | ICD-10-CM

## 2012-09-26 DIAGNOSIS — K219 Gastro-esophageal reflux disease without esophagitis: Secondary | ICD-10-CM

## 2012-09-26 DIAGNOSIS — J45909 Unspecified asthma, uncomplicated: Secondary | ICD-10-CM

## 2012-09-26 DIAGNOSIS — F70 Mild intellectual disabilities: Secondary | ICD-10-CM

## 2012-09-26 DIAGNOSIS — G4733 Obstructive sleep apnea (adult) (pediatric): Secondary | ICD-10-CM

## 2012-09-26 DIAGNOSIS — E039 Hypothyroidism, unspecified: Secondary | ICD-10-CM

## 2012-09-26 DIAGNOSIS — G244 Idiopathic orofacial dystonia: Secondary | ICD-10-CM

## 2012-09-26 DIAGNOSIS — R82998 Other abnormal findings in urine: Secondary | ICD-10-CM

## 2012-09-30 ENCOUNTER — Encounter: Payer: Self-pay | Admitting: Internal Medicine

## 2012-09-30 IMAGING — CR DG CHEST 2V
2 series · 2 of 2 positions shown · non-contrast
Comparison: 05/17/2011

CLINICAL DATA: Chest pain

CHEST - 2 VIEW

[w chest lat]
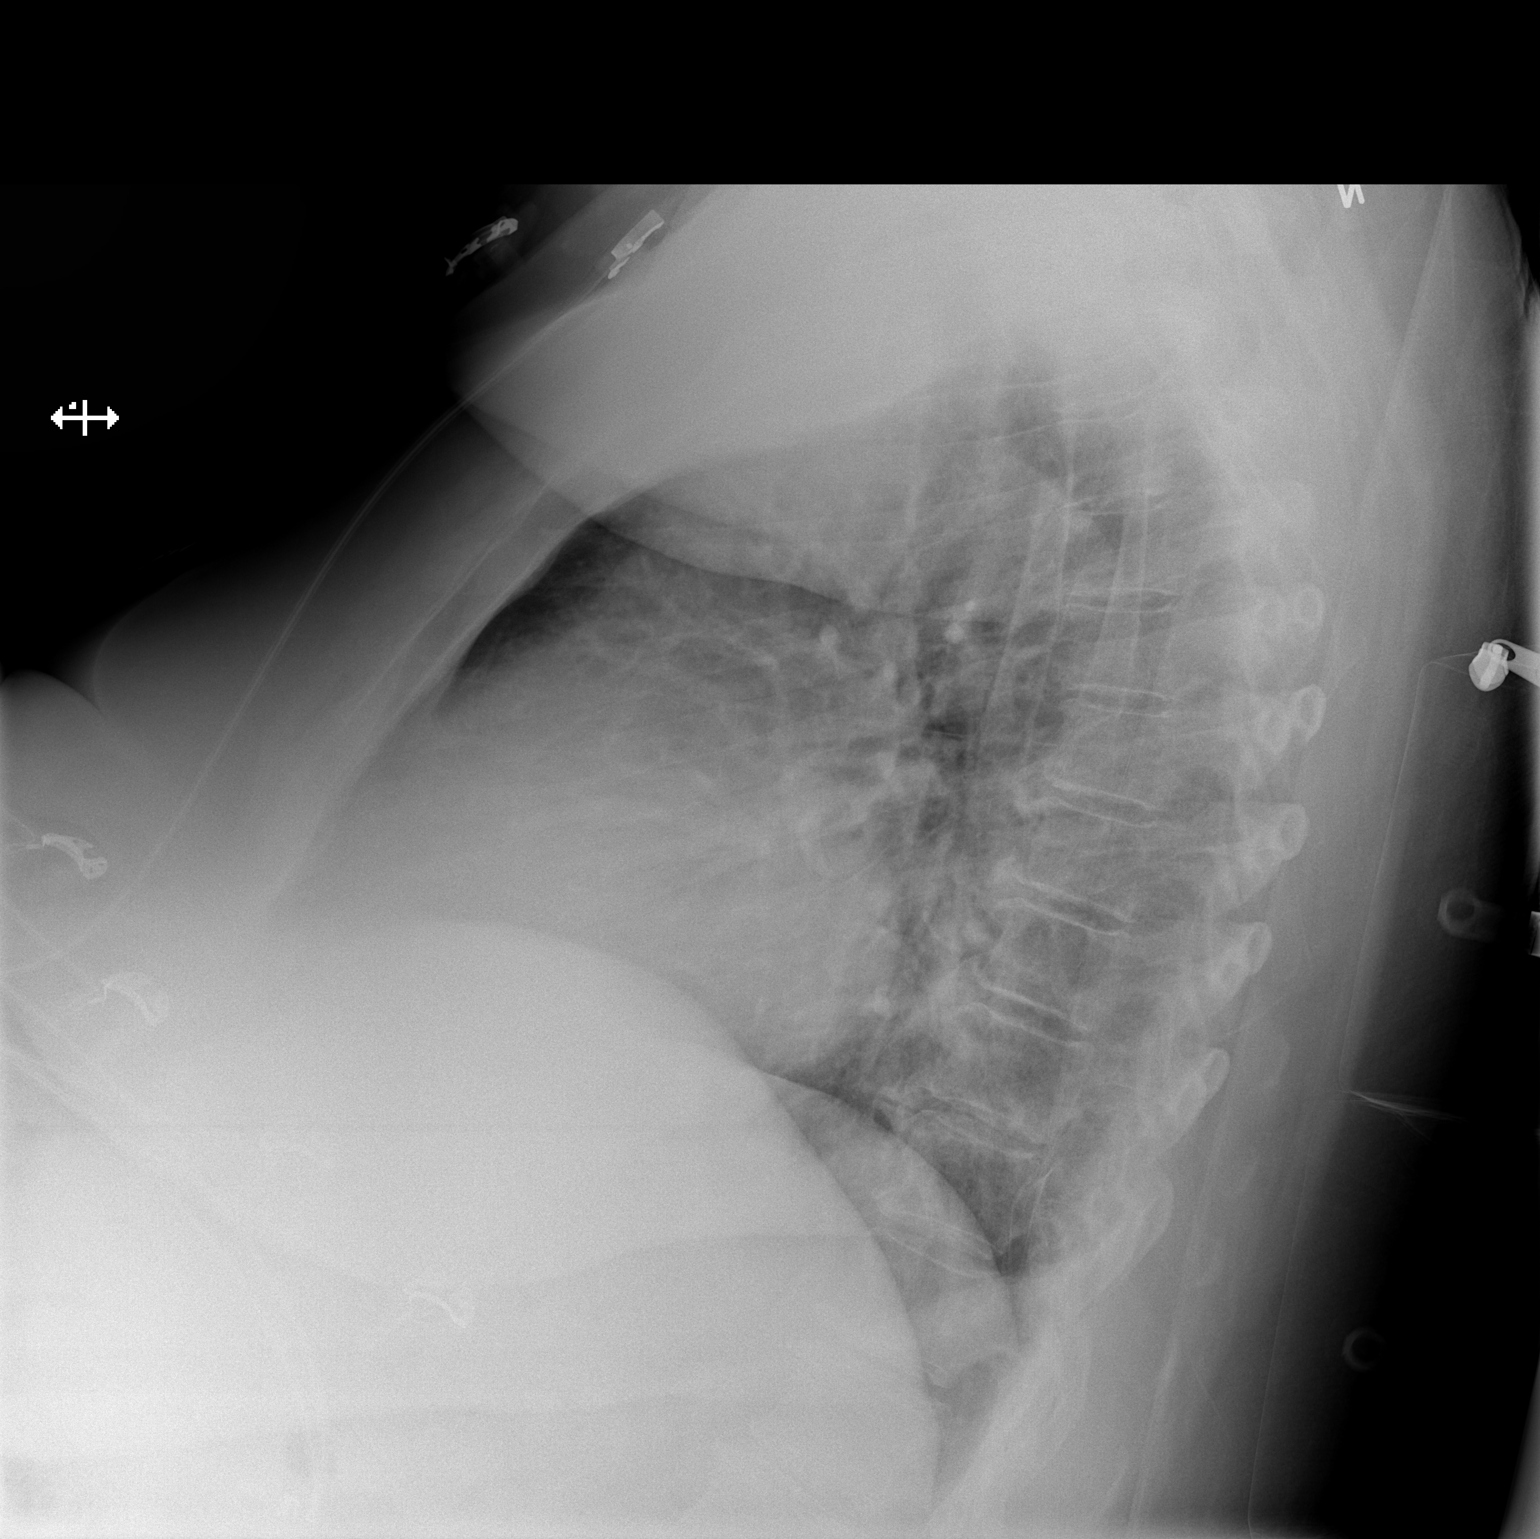

[x chest ap]
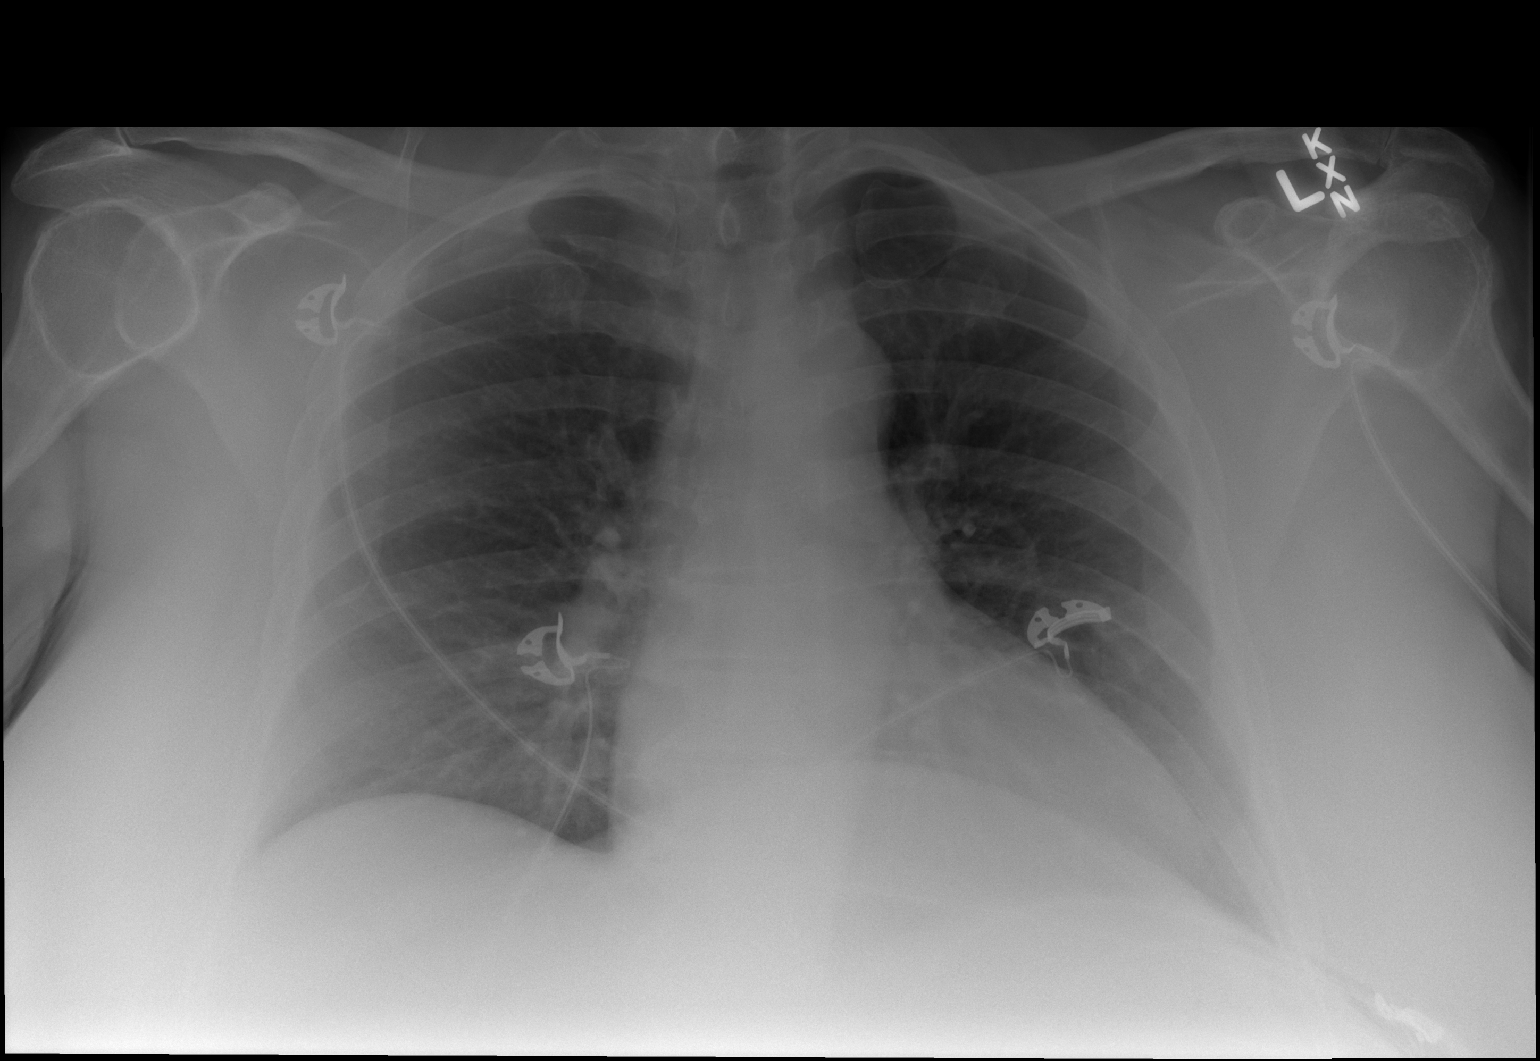

[2 of 2 positions shown; findings below may reference images not displayed]

FINDINGS: Cardiac enlargement without heart failure.  Lungs are
clear with improved aeration compared with  the prior study.
Negative for pneumonia or effusion.
IMPRESSION: No active cardiopulmonary disease.

## 2012-09-30 NOTE — Assessment & Plan Note (Addendum)
Has been in and out of facilities and has been here 2x before.  Likely requires long term care at this point.  Is not adherent with medications when at home, and her psych meds always get stopped.

## 2012-09-30 NOTE — Assessment & Plan Note (Signed)
Had MI in past.  Has recurrent episodes of chest pain and goes to ED.  Latest workup revealed UTI.  She is on treatment with keflex.

## 2012-09-30 NOTE — Assessment & Plan Note (Signed)
Is to complete 10 day course of keflex therapy for UTI diagnosed in ED when she had chest pain.

## 2012-09-30 NOTE — Assessment & Plan Note (Signed)
Stable with omeprazole

## 2012-09-30 NOTE — Progress Notes (Signed)
Patient ID: [name], female   DOB: Apr 12, 1951, 62 y.o.   MRN: [id number]   PCP: [name], MD  Code Status: Full code--paperwork not yet in chart  Allergies  Allergen Reactions  . Food Rash    "lasagna"= rash from the noodles  . Penicillins Hives  . Sulfamethoxazole W-Trimethoprim Hives and Nausea And Vomiting    Chief Complaint:  New admission s/p hospitalization for chest pain HPI:  62 yo female with schizoaffective disorder, obesity, hypothyroidism, OSA, HTN, asthma, GERD, osteoarthritis, low back pain and insomnia was admitted probably for long term care at this point due to her inability to take care of herself.  She was sent to [place] after an ED visit for chest pain where she was placed on keflex for a UTI.  She has no related urinary complaints, but does c/o insomnia and says she wants her seroquel and a couple of other meds back.  I don't know when or how they were stopped or by whom at this point.  She is currently on no psychoactive meds whatsoever despite her schizoaffective disorder, but also appears awake, alert and conversant.    Review of Systems:  Review of Systems  Constitutional: Negative for fever and chills.  HENT: Negative for congestion and sore throat.   Eyes: Negative for blurred vision.  Respiratory: Negative for cough, sputum production and wheezing.   Cardiovascular: Negative for chest pain, palpitations, orthopnea, leg swelling and PND.  Gastrointestinal: Negative for nausea, vomiting, diarrhea and constipation.  Genitourinary: Negative for dysuria, urgency, frequency and flank pain.  Musculoskeletal: Positive for back pain and joint pain.  Skin: Negative for rash.  Neurological: Negative for dizziness, focal weakness and headaches.  Psychiatric/Behavioral: Positive for depression. The patient is nervous/anxious and has insomnia.      Past Medical History  Diagnosis Date  . Schizoaffective disorder   . Urinary tract infection   .  Hypertension   . GERD (gastroesophageal reflux disease)   . Hypothyroidism   . Obesity   . Asthma   . COPD (chronic obstructive pulmonary disease)   . CHF (congestive heart failure)   . Seizures   . Morbid obesity   . Chronic pain   . Hypercholesteremia   . Anginal pain   . Myocardial infarction 2002  . Pneumonia     "several times" (05/23/2012)  . Diabetes mellitus     "I'm not diabetic anymore" (05/23/2012)  . Anemia   . History of blood transfusion 1993    "when I had my hysterectomy" (05/23/2012)  . H/O hiatal hernia   . Daily headache   . Arthritis     "severe; all over my body" (05/23/2012)  . Hypoventilation syndrome     [name] 05/30/2012  . Atrial fibrillation   . Atrial flutter, paroxysmal     [name] 05/30/2012  . Exertional dyspnea   . Shortness of breath     "all the time lately" (05/30/2012)   Past Surgical History  Procedure Laterality Date  . Vaginal hysterectomy  1993  . Tubal ligation  1998  . Cholecystectomy  ?2008  . Cardiac catheterization  2008   Social History:   reports that she quit smoking about 12 years ago. Her smoking use included Cigarettes. She has a 24 pack-year smoking history. Her smokeless tobacco use includes Snuff and Chew. She reports that she does not drink alcohol or use illicit drugs.  Family History  Problem Relation Age of Onset  . Heart disease Father   .  Cancer Mother 1    Breast    Medications: Patient's Medications  New Prescriptions   No medications on file  Previous Medications   ALBUTEROL (PROVENTIL HFA;VENTOLIN HFA) 108 (90 BASE) MCG/ACT INHALER    Inhale 2 puffs into the lungs every 6 (six) hours as needed for wheezing.   ASPIRIN EC 81 MG TABLET    Take 81 mg by mouth every morning.    BUDESONIDE (PULMICORT) 0.25 MG/2ML NEBULIZER SOLUTION    Take 0.25 mg by nebulization 2 (two) times daily.   CEPHALEXIN (KEFLEX) 500 MG CAPSULE    Take 1 capsule (500 mg total) by mouth 4 (four) times daily.   FLUTICASONE  (FLONASE) 50 MCG/ACT NASAL SPRAY    Place 2 sprays into the nose daily as needed. For nasal congestion   LEVOFLOXACIN (LEVAQUIN) 750 MG TABLET    Take 1 tablet (750 mg total) by mouth daily.   LEVOTHYROXINE (SYNTHROID, LEVOTHROID) 150 MCG TABLET    Take 150 mcg by mouth every morning. For thyroid disease.   METOPROLOL SUCCINATE (TOPROL-XL) 50 MG 24 HR TABLET    Take 50 mg by mouth daily. Take with or immediately following a meal.   MULTIPLE VITAMIN (MULTIVITAMIN WITH MINERALS) TABS    Take 1 tablet by mouth every morning. For nutritional supplement.   NITROGLYCERIN (NITROSTAT) 0.4 MG SL TABLET    Place 0.4 mg under the tongue every 5 (five) minutes as needed. For chest pain   OMEPRAZOLE (PRILOSEC) 20 MG CAPSULE    Take 20 mg by mouth every morning.    PALIPERIDONE ([name]) 3 MG 24 HR TABLET    Take 3 mg by mouth daily.   POLYETHYLENE GLYCOL (MIRALAX / GLYCOLAX) PACKET    Take 17 g by mouth every 3 (three) days.    TRAMADOL (ULTRAM) 50 MG TABLET    Take 50 mg by mouth 4 (four) times daily.   Modified Medications   No medications on file  Discontinued Medications   No medications on file     Physical Exam: Filed Vitals:   09/26/12 1543  BP: 122/81  Pulse: 89  Temp: 97.9 F (36.6 C)  Resp: 20  Height: 5\' 2"  (1.575 m)  Weight: 292 lb (132.45 kg)   Physical Exam  Nursing note and vitals reviewed. Constitutional: No distress.  Morbidly obese Caucasian female  HENT:  Head: Normocephalic and atraumatic.  Right Ear: External ear normal.  Left Ear: External ear normal.  Nose: Nose normal.  Mouth/Throat: Oropharynx is clear and moist. No oropharyngeal exudate.  Eyes: Conjunctivae and EOM are normal. Pupils are equal, round, and reactive to light. Right eye exhibits no discharge. Left eye exhibits no discharge. No scleral icterus.  Neck: Normal range of motion. Neck supple. No JVD present. No thyromegaly present.  Cardiovascular: Normal rate, regular rhythm, normal heart sounds and intact  distal pulses.   Pulmonary/Chest: Effort normal. She has no wheezes.  Abdominal: Soft. Bowel sounds are normal. She exhibits no distension and no mass. There is no tenderness.  Musculoskeletal: Normal range of motion. She exhibits no edema and no tenderness.  Neurological: She is alert. No cranial nerve deficit.  Oriented to person and place, not to specific date/time  Skin: Skin is warm and dry. She is not diaphoretic. There is pallor.  Psychiatric: Her affect is inappropriate. Her speech is tangential. Cognition and memory are impaired. She expresses inappropriate judgment.  Focused only on getting three meds back on her list   Labs reviewed: Basic Metabolic  Panel:  Recent Labs  02/29/12 1346  03/29/12 1140  06/02/12 1708 06/03/12 0605  07/30/12 0440  09/20/12 1529 09/21/12 0525 09/23/12 1738  NA 138  < > 134*  < > 137 140  < >  --   < > 134* 138 139  K 4.3  < > 5.0  < > 4.5 4.5  < >  --   < > 5.7* 3.4* 3.3*  CL 99  < > 95*  < > 100 100  < >  --   < > 99 104 101  CO2 35*  < > 30  < > 30 31  < >  --   < > 28 26 30   GLUCOSE 96  < > 104*  < > 101* 97  < >  --   < > 101* 90 111*  BUN 12  < > 19  < > 13 10  < >  --   < > 12 9 8   CREATININE 0.85  < > 2.20*  < > 1.19* 1.00  < >  --   < > 0.71 0.65 0.82  CALCIUM 9.6  < > 9.7  < > 9.7 10.2  < >  --   < > 9.9 9.5 10.5  MG 1.7  --  1.9  --   --   --   --  1.7  --   --   --   --   PHOS  --   --  5.5*  < > 2.8 2.6  --  3.7  --   --   --   --   < > = values in this interval not displayed. Liver Function Tests:  Recent Labs  08/07/12 0216 08/15/12 1820 09/01/12 0552  AST 36 22 31  ALT 21 12 14   ALKPHOS 44 46 59  BILITOT 0.3 0.6 0.4  PROT 6.3 6.4 6.0  ALBUMIN 2.8* 3.0* 2.6*    Recent Labs  03/28/12 1457 03/29/12 1140 05/23/12 1428  LIPASE 40 44 25  CBC:  Recent Labs  08/15/12 1820  09/02/12 0720  09/20/12 1529 09/21/12 0525 09/23/12 1738  WBC 6.1  < > 4.9  < > 10.0 5.7 7.9  NEUTROABS 3.3  --  2.4  --  6.8  --   --    HGB 13.7  < > 12.6  < > 13.9 11.8* 14.9  HCT 41.8  < > 38.8  < > 39.9 37.5 42.9  MCV 99.8  < > 101.6*  < > 96.1 98.9 97.3  PLT 104*  < > 95*  < > 256 205 207  < > = values in this interval not displayed. Cardiac Enzymes:  Recent Labs  06/01/12 0730  06/02/12 1707 06/03/12 0605 06/03/12 1107 07/27/12 1717 08/15/12 1820  CKTOTAL  --   < > 44 28 42  --   --   CKMB  --   < > 1.7 1.7 1.7  --   --   TROPONINI <0.30  --   --   --   --  <0.30 <0.30  < > = values in this interval not displayed. CBG:  Recent Labs  09/05/12 0754 09/05/12 1212 09/21/12 0547  GLUCAP 87 93 91   Assessment/Plan HYPOTHYROIDISM NOS Continue current dose of synthroid as TSH and free T4 were wnl last week.    RETARDATION, MENTAL, MILD Has been in and out of facilities and has been here 2x before.  Likely requires long term care  at this point.  Is not adherent with medications when at home, and her psych meds always get stopped.     OBSTRUCTIVE SLEEP APNEA Should be using CPAP.  I am not certain if she is at present.  TARDIVE DYSKINESIA Limits antipsychotic choices.  Was most recently on seroquel.  Currently off all psychotropics  HYPERTENSION, BENIGN ESSENTIAL At goal with toprol alone.  Cont to monitor.  ASTHMA Cont on pulmicort nebs and albuterol hfa.  No current wheezing or dyspnea.    GERD Stable with omeprazole.  LOW BACK PAIN SYNDROME Remains on tramadol for pain.  Is controlled.  INSOMNIA Demanding to go back on seroquel and trazodone for sleep.  I chose to continue to monitor unless staff note she truly is not sleeping.    CHEST PAIN, HX OF Had MI in past.  Has recurrent episodes of chest pain and goes to ED.  Latest workup revealed UTI.  She is on treatment with keflex.  URINALYSIS, ABNORMAL Is to complete 10 day course of keflex therapy for UTI diagnosed in ED when she had chest pain.  Schizoaffective disorder, bipolar type Is currently off all of her psych meds.  Will continue  to monitor b/c she is much more alert and able to participate in conversation than she has been when she's been medicated in the past.  She is requesting her seroquel, trazodone.   Family/ staff Communication: plan of care discussed with nursing staff   Goals of care: full code

## 2012-09-30 NOTE — Assessment & Plan Note (Signed)
Is currently off all of her psych meds.  Will continue to monitor b/c she is much more alert and able to participate in conversation than she has been when she's been medicated in the past.  She is requesting her seroquel, trazodone.

## 2012-09-30 NOTE — Assessment & Plan Note (Signed)
Limits antipsychotic choices.  Was most recently on seroquel.  Currently off all psychotropics

## 2012-09-30 NOTE — Assessment & Plan Note (Signed)
Demanding to go back on seroquel and trazodone for sleep.  I chose to continue to monitor unless staff note she truly is not sleeping.

## 2012-09-30 NOTE — Assessment & Plan Note (Signed)
At goal with toprol alone.  Cont to monitor.

## 2012-09-30 NOTE — Assessment & Plan Note (Signed)
Should be using CPAP.  I am not certain if she is at present.

## 2012-09-30 NOTE — Assessment & Plan Note (Signed)
Remains on tramadol for pain.  Is controlled.

## 2012-09-30 NOTE — Assessment & Plan Note (Signed)
Cont on pulmicort nebs and albuterol hfa.  No current wheezing or dyspnea.

## 2012-09-30 NOTE — Assessment & Plan Note (Signed)
Continue current dose of synthroid as TSH and free T4 were wnl last week.

## 2012-10-01 IMAGING — CR DG SHOULDER 2+V*L*
5 series · 5 of 5 positions shown · non-contrast
Comparison: None.

CLINICAL DATA: Injury.  Pain

LEFT SHOULDER - 2+ VIEW

[x shoulder ap left (1 of 2)]
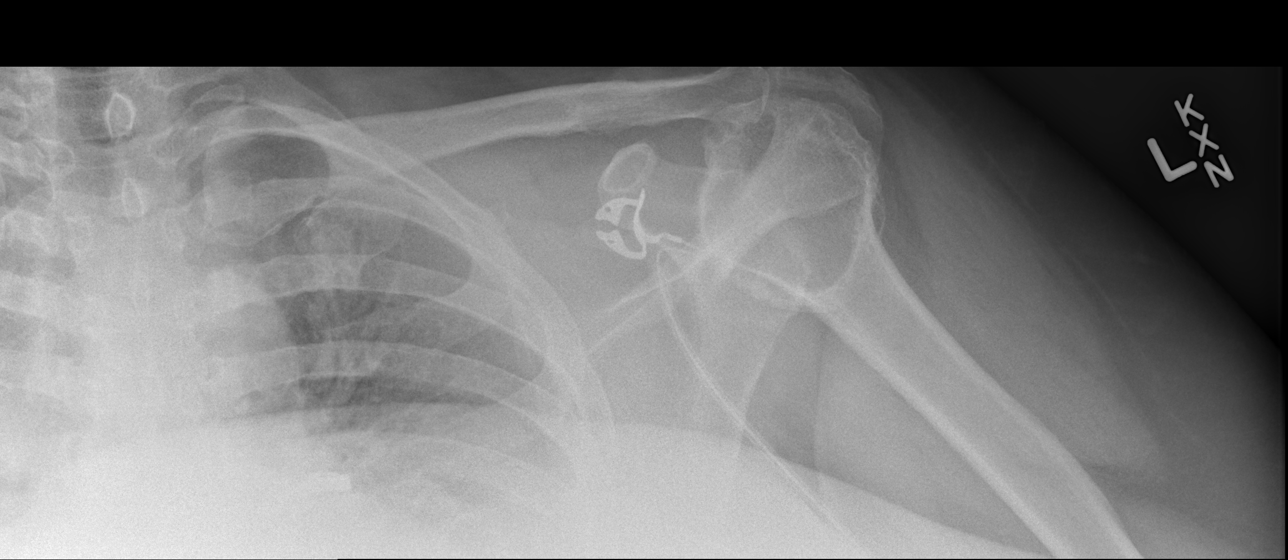

[x shoulder ap left (2 of 2)]
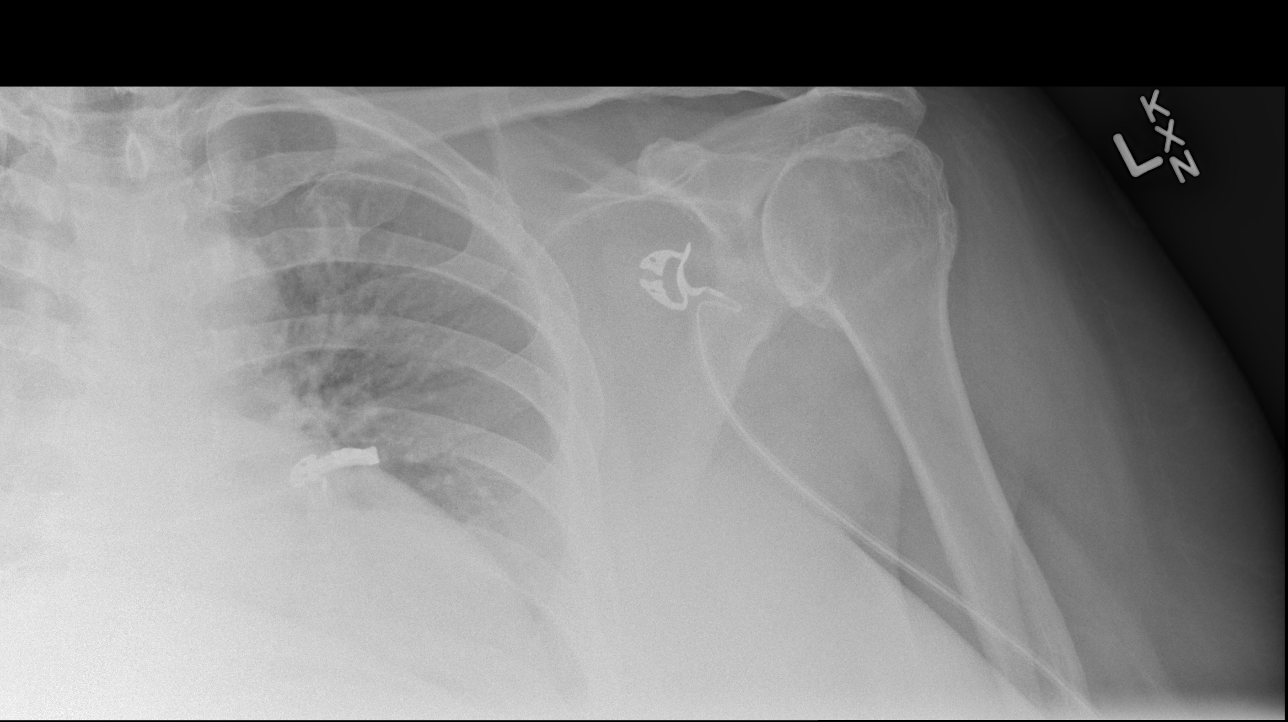

[x scapula y-view left (1 of 3)]
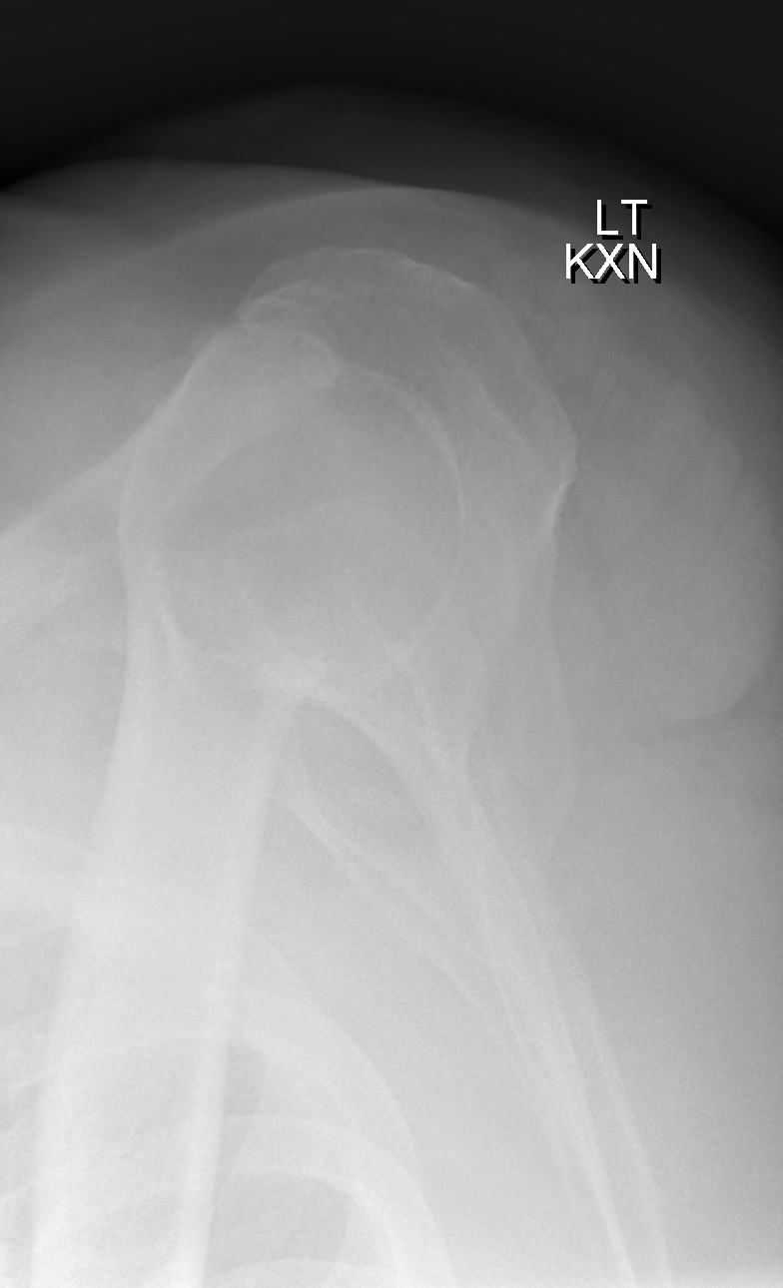

[x scapula y-view left (2 of 3)]
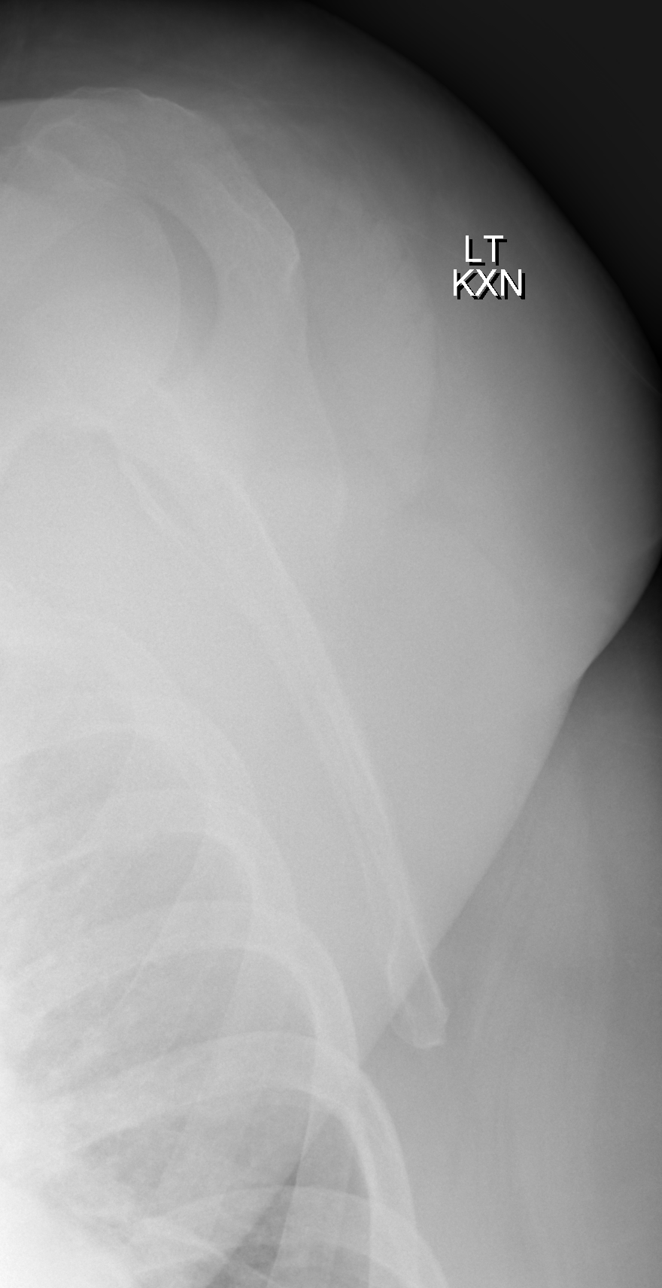

[x scapula y-view left (3 of 3)]
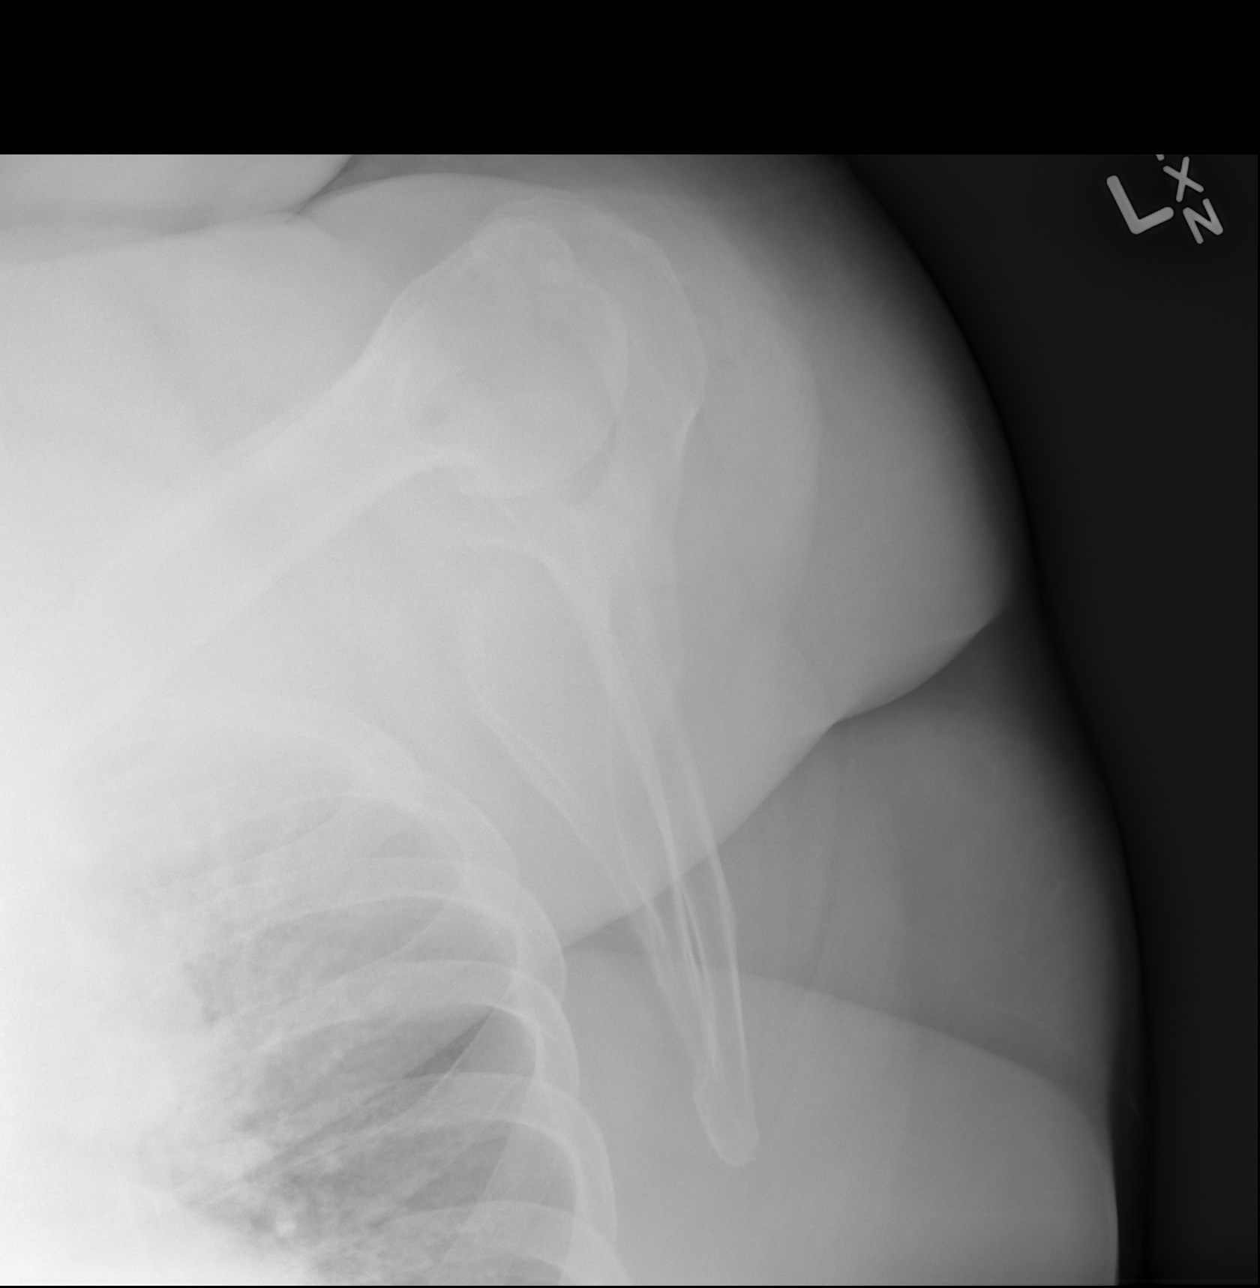

[5 of 5 positions shown; findings below may reference images not displayed]

FINDINGS: Negative for dislocation.  Negative for fracture.

Degenerative change with irregularity of the greater tuberosity.
Mild degenerative change and spurring of the AC joint.
Degenerative change and spurring in the shoulder joint.
IMPRESSION: Degenerative changes in the shoulder.  No acute fracture or
dislocation.

## 2012-10-16 ENCOUNTER — Encounter: Payer: Self-pay | Admitting: Adult Health

## 2012-10-16 ENCOUNTER — Non-Acute Institutional Stay (SKILLED_NURSING_FACILITY): Payer: Medicare Other | Admitting: Adult Health

## 2012-10-16 DIAGNOSIS — IMO0002 Reserved for concepts with insufficient information to code with codable children: Secondary | ICD-10-CM

## 2012-10-16 DIAGNOSIS — S31819S Unspecified open wound of right buttock, sequela: Secondary | ICD-10-CM

## 2012-10-18 IMAGING — CR DG CHEST 1V PORT
1 series · 1 of 1 positions shown · non-contrast
Comparison: 06/16/2011

CLINICAL DATA: Chest pain and shortness of breath.

PORTABLE CHEST - 1 VIEW

[AP]
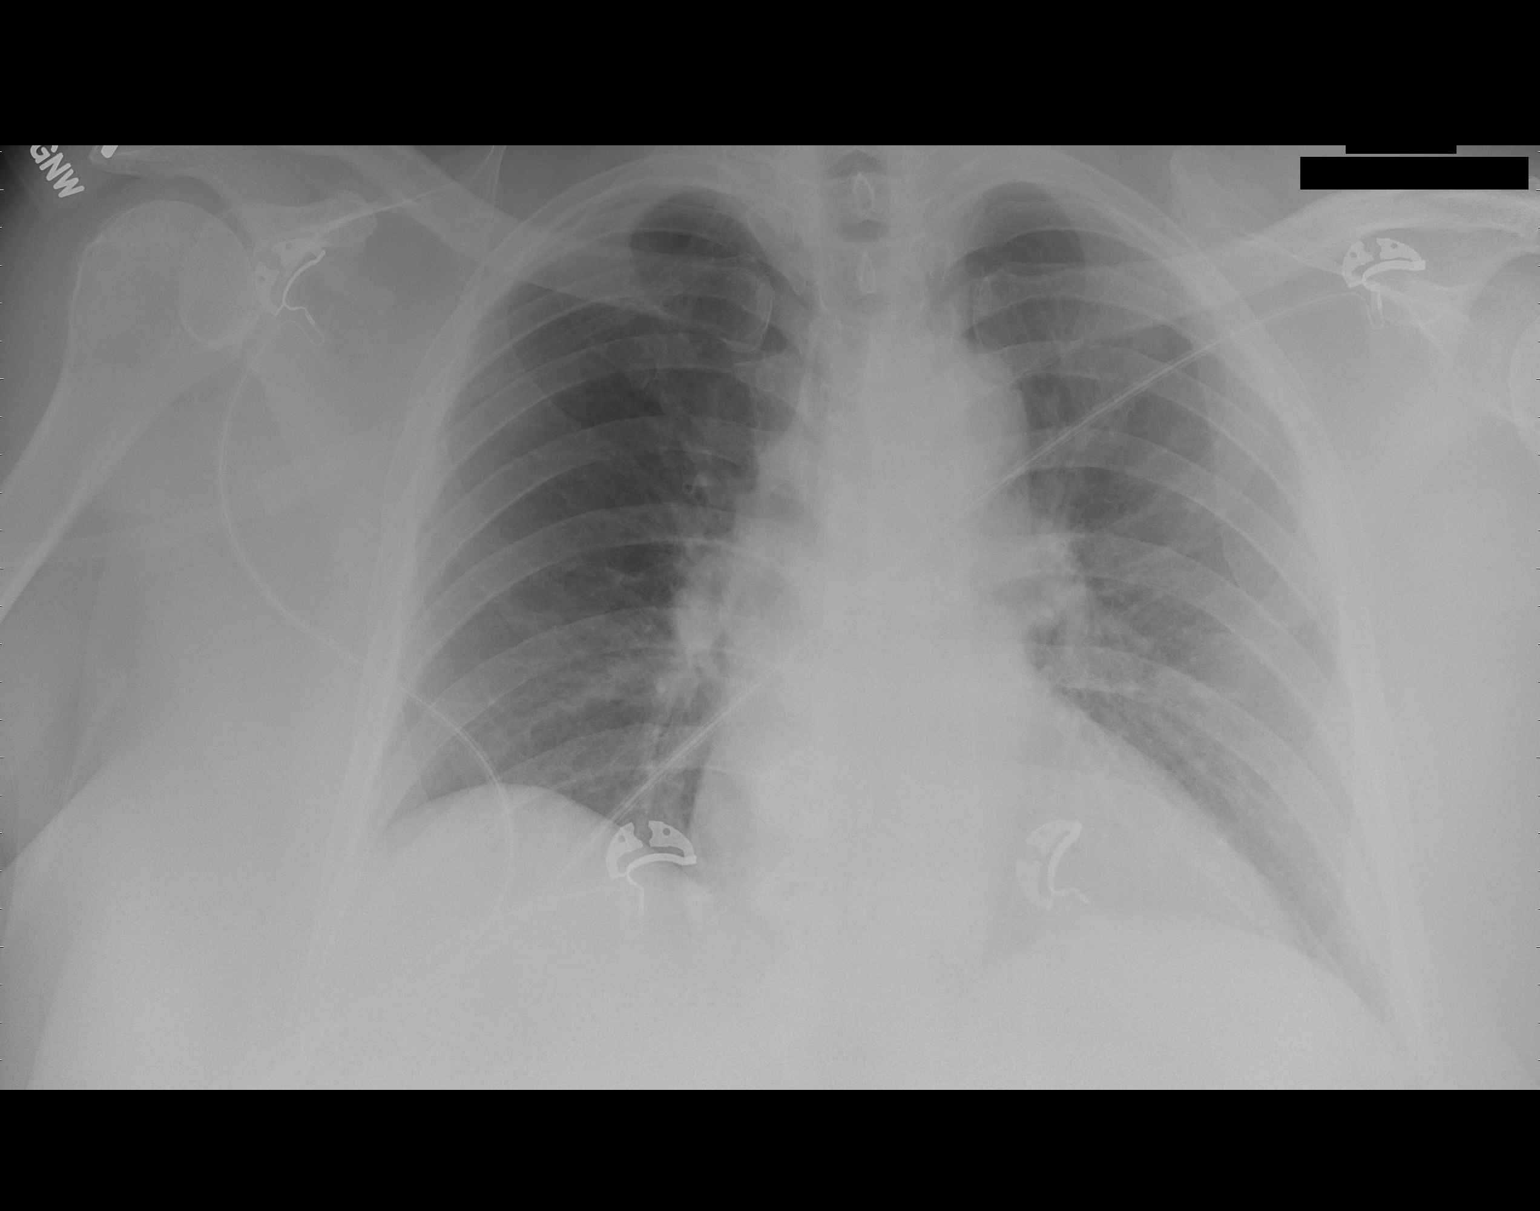

[1 of 1 positions shown; findings below may reference images not displayed]

FINDINGS: Heart size and vascularity are normal and the lungs are
clear.  No osseous abnormality.
IMPRESSION: Normal chest.

## 2012-11-02 ENCOUNTER — Non-Acute Institutional Stay (SKILLED_NURSING_FACILITY): Payer: Medicare Other | Admitting: Nurse Practitioner

## 2012-11-02 ENCOUNTER — Encounter: Payer: Self-pay | Admitting: Nurse Practitioner

## 2012-11-02 DIAGNOSIS — E039 Hypothyroidism, unspecified: Secondary | ICD-10-CM

## 2012-11-02 DIAGNOSIS — N390 Urinary tract infection, site not specified: Secondary | ICD-10-CM

## 2012-11-02 DIAGNOSIS — F259 Schizoaffective disorder, unspecified: Secondary | ICD-10-CM

## 2012-11-02 DIAGNOSIS — I10 Essential (primary) hypertension: Secondary | ICD-10-CM

## 2012-11-02 DIAGNOSIS — F250 Schizoaffective disorder, bipolar type: Secondary | ICD-10-CM

## 2012-11-02 NOTE — Assessment & Plan Note (Signed)
Staff reports more changes in behaviors recently - will check labs to rule out infections, electrolyte imbalances and thyroid

## 2012-11-02 NOTE — Assessment & Plan Note (Signed)
Last tsh was 2.176 1 month ago  However due to recent changes will recheck thyroid

## 2012-11-02 NOTE — Assessment & Plan Note (Signed)
Patient is stable; continue current regimen. Will monitor and make changes as necessary.  

## 2012-11-02 NOTE — Progress Notes (Signed)
Patient ID: Jennifer BOZARTH, female   DOB: [birth year], 62 y.o.   MRN: [id number]  Nursing Home Location:  [facility]   Place of Service: SNF [phone])   Chief Complaint:  Medical management of chronic conditions plus recent mood changes   HPI:  62 yo female with schizoaffective disorder, obesity, hypothyroidism, OSA, HTN, asthma, GERD, osteoarthritis, low back pain and insomnia seen today for routine follow up and mood changes. Pt was discharged from the hospital last month due to UTI and currently has C&S pending. Pt has been more delusional the past few days and refusing medications.  Pt was not corporative in trying to do a ROS or PE however these were very limited.     Review of Systems:  Review of Systems  Unable to perform ROS: other  pt unwilling to participate in ROS   Medications: Patient's Medications  New Prescriptions   No medications on file  Previous Medications   ALBUTEROL (PROVENTIL HFA;VENTOLIN HFA) 108 (90 BASE) MCG/ACT INHALER    Inhale 2 puffs into the lungs every 6 (six) hours as needed for wheezing.   ASPIRIN EC 81 MG TABLET    Take 81 mg by mouth every morning.    BUDESONIDE (PULMICORT) 0.25 MG/2ML NEBULIZER SOLUTION    Take 0.25 mg by nebulization 2 (two) times daily.   COLLAGENASE (SANTYL) OINTMENT    Apply 1 application topically daily.   FLUTICASONE (FLONASE) 50 MCG/ACT NASAL SPRAY    Place 2 sprays into the nose daily as needed. For nasal congestion   LEVOTHYROXINE (SYNTHROID, LEVOTHROID) 150 MCG TABLET    Take 150 mcg by mouth every morning. For thyroid disease.   METOPROLOL SUCCINATE (TOPROL-XL) 50 MG 24 HR TABLET    Take 50 mg by mouth daily. Take with or immediately following a meal.   MULTIPLE VITAMIN (MULTIVITAMIN WITH MINERALS) TABS    Take 1 tablet by mouth every morning. For nutritional supplement.   NITROGLYCERIN (NITROSTAT) 0.4 MG SL TABLET    Place 0.4 mg under the tongue every 5 (five) minutes as needed. For chest pain   OMEPRAZOLE (PRILOSEC) 20 MG CAPSULE    Take 20 mg by mouth every morning.    PALIPERIDONE ([name]) 3 MG 24 HR TABLET    Take 3 mg by mouth daily.   POLYETHYLENE GLYCOL (MIRALAX / GLYCOLAX) PACKET    Take 17 g by mouth every 3 (three) days.    TRAMADOL (ULTRAM) 50 MG TABLET    Take 50 mg by mouth 4 (four) times daily.   Modified Medications   No medications on file  Discontinued Medications   CEPHALEXIN (KEFLEX) 500 MG CAPSULE    Take 1 capsule (500 mg total) by mouth 4 (four) times daily.   LEVOFLOXACIN (LEVAQUIN) 750 MG TABLET    Take 1 tablet (750 mg total) by mouth daily.     Physical Exam:  Filed Vitals:   11/02/12 1056  BP: 136/84  Pulse: 86  Temp: 98.9 F (37.2 C)  Resp: 20  Weight: 282 lb (127.914 kg)    Physical Exam  Constitutional: No distress.  Cardiovascular: Normal rate, regular rhythm and normal heart sounds.   Pulmonary/Chest: Effort normal.  Abdominal: Soft. Bowel sounds are normal. She exhibits no distension. There is no tenderness.  Neurological: She is alert.  Skin: She is not diaphoretic.     Assessment/Plan Schizoaffective disorder, bipolar type Staff reports more changes in behaviors recently - will check labs to rule out infections, electrolyte imbalances  and thyroid   Hypertension Patient is stable; continue current regimen. Will monitor and make changes as necessary.   Hypothyroidism Last tsh was 2.176 1 month ago  However due to recent changes will recheck thyroid   UTI (lower urinary tract infection) Will start macroBID twice daily for 10 days while awaiting sensitivities

## 2012-11-06 ENCOUNTER — Encounter: Payer: Self-pay | Admitting: Adult Health

## 2012-11-06 ENCOUNTER — Non-Acute Institutional Stay (SKILLED_NURSING_FACILITY): Payer: Medicare Other | Admitting: Adult Health

## 2012-11-06 DIAGNOSIS — F259 Schizoaffective disorder, unspecified: Secondary | ICD-10-CM

## 2012-11-06 DIAGNOSIS — F250 Schizoaffective disorder, bipolar type: Secondary | ICD-10-CM

## 2012-11-06 DIAGNOSIS — N390 Urinary tract infection, site not specified: Secondary | ICD-10-CM

## 2012-11-06 NOTE — Progress Notes (Signed)
Patient ID: [name], female   DOB: Feb 10, 1951, 62 y.o.   MRN: [id number]  [facility]  Allergies  Allergen Reactions  . Food Rash    "lasagna"= rash from the noodles  . Penicillins Hives  . Sulfamethoxazole-Trimethoprim Hives and Nausea And Vomiting    Chief Complaint  Patient presents with  . Acute Visit    delusions    HPI She is being seen today for her uti; for which she is presently declining to take her abt; stating she doesn't need them. She is also delusional she thinks that she is [facility]; she is having sex with numerous people who do not exist. She told me not to tell her significant other about her numerous boyfriends; she is too delusional today to fully participate in the hpi or ros.   Past Medical History  Diagnosis Date  . Schizoaffective disorder   . Urinary tract infection   . Hypertension   . GERD (gastroesophageal reflux disease)   . Hypothyroidism   . Obesity   . Asthma   . COPD (chronic obstructive pulmonary disease)   . CHF (congestive heart failure)   . Seizures   . Morbid obesity   . Chronic pain   . Hypercholesteremia   . Anginal pain   . Myocardial infarction 2002  . Diabetes mellitus     "I'm not diabetic anymore" (05/23/2012)  . Anemia   . History of blood transfusion 1993    "when I had my hysterectomy" (05/23/2012)  . H/O hiatal hernia   . Daily headache   . Arthritis     "severe; all over my body" (05/23/2012)  . Hypoventilation syndrome     [name] 05/30/2012  . Atrial fibrillation   . Atrial flutter, paroxysmal     [name] 05/30/2012  . Exertional dyspnea   . Shortness of breath     "all the time lately" (05/30/2012)  . Pneumonia     "several times; including now" (04/23/2013)    Past Surgical History  Procedure Laterality Date  . Tubal ligation  1998  . Cholecystectomy  ?2008  . Cardiac catheterization  2008  . Vaginal hysterectomy  1993    Filed Vitals:   11/06/12 1451  BP: 134/78  Pulse: 70  Height: 5'  2" (1.575 m)  Weight: 298 lb (135.172 kg)    LABS REVIEWED:   11-05-12:urine culture: e-coli  Review of Systems  Unable to perform ROS  Physical Exam  Constitutional:  Morbid obesity  Neck: Neck supple. No JVD present.  Cardiovascular: Normal rate and regular rhythm.   Respiratory: Effort normal and breath sounds normal. No respiratory distress. She has no wheezes.  GI: Soft. Bowel sounds are normal. She exhibits no distension. There is no tenderness.  Musculoskeletal: She exhibits no edema.  Is able to move extremities  Skin: Skin is warm and dry.    ASSESSMENT/PLAN  1. Uti: will change her abt to rocephin 1 gm im daily and will stop the macrobid  2. Schizophrenia: due to her delusions will increase her invega to 9 mg daily and will monitor her status

## 2012-11-14 NOTE — Assessment & Plan Note (Signed)
Will start macroBID twice daily for 10 days while awaiting sensitivities

## 2012-11-25 IMAGING — CR DG CHEST 2V
2 series · 2 of 2 positions shown · non-contrast
Comparison: Portable chest x-ray of 06/21/2011

CLINICAL DATA: Shortness of breath, cough

CHEST - 2 VIEW

[x chest ap]
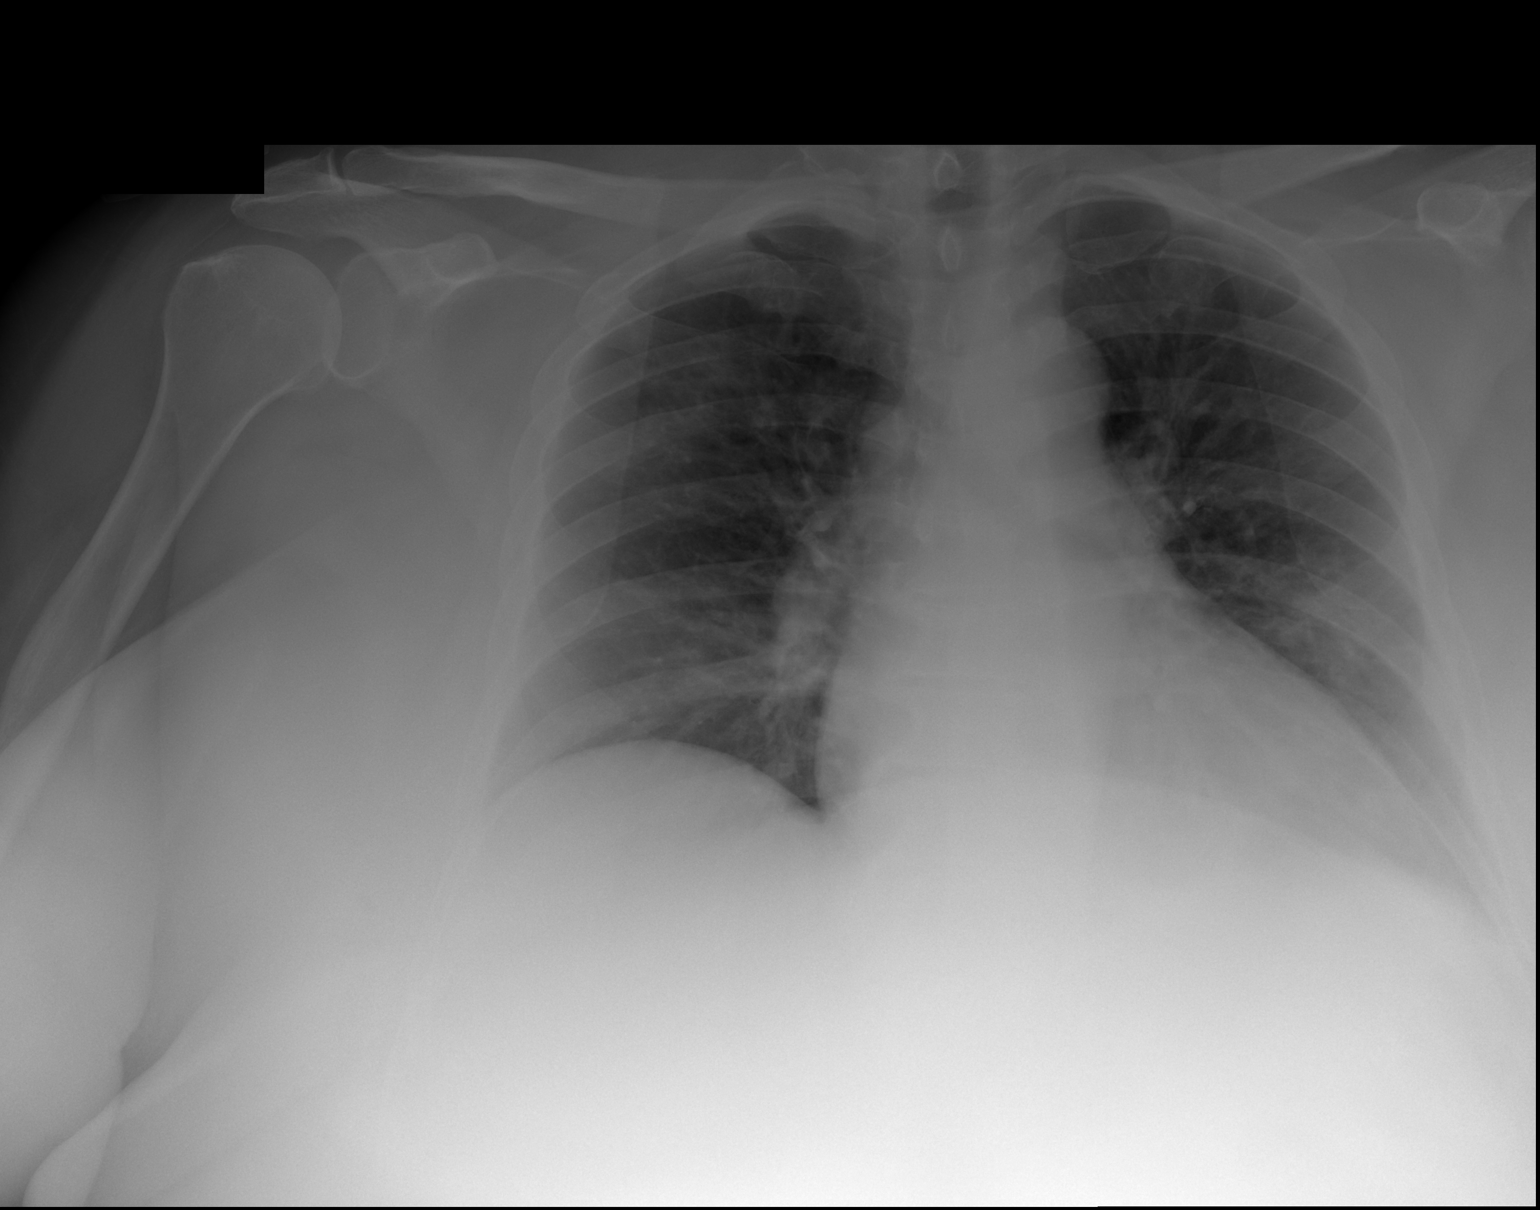

[w chest lat]
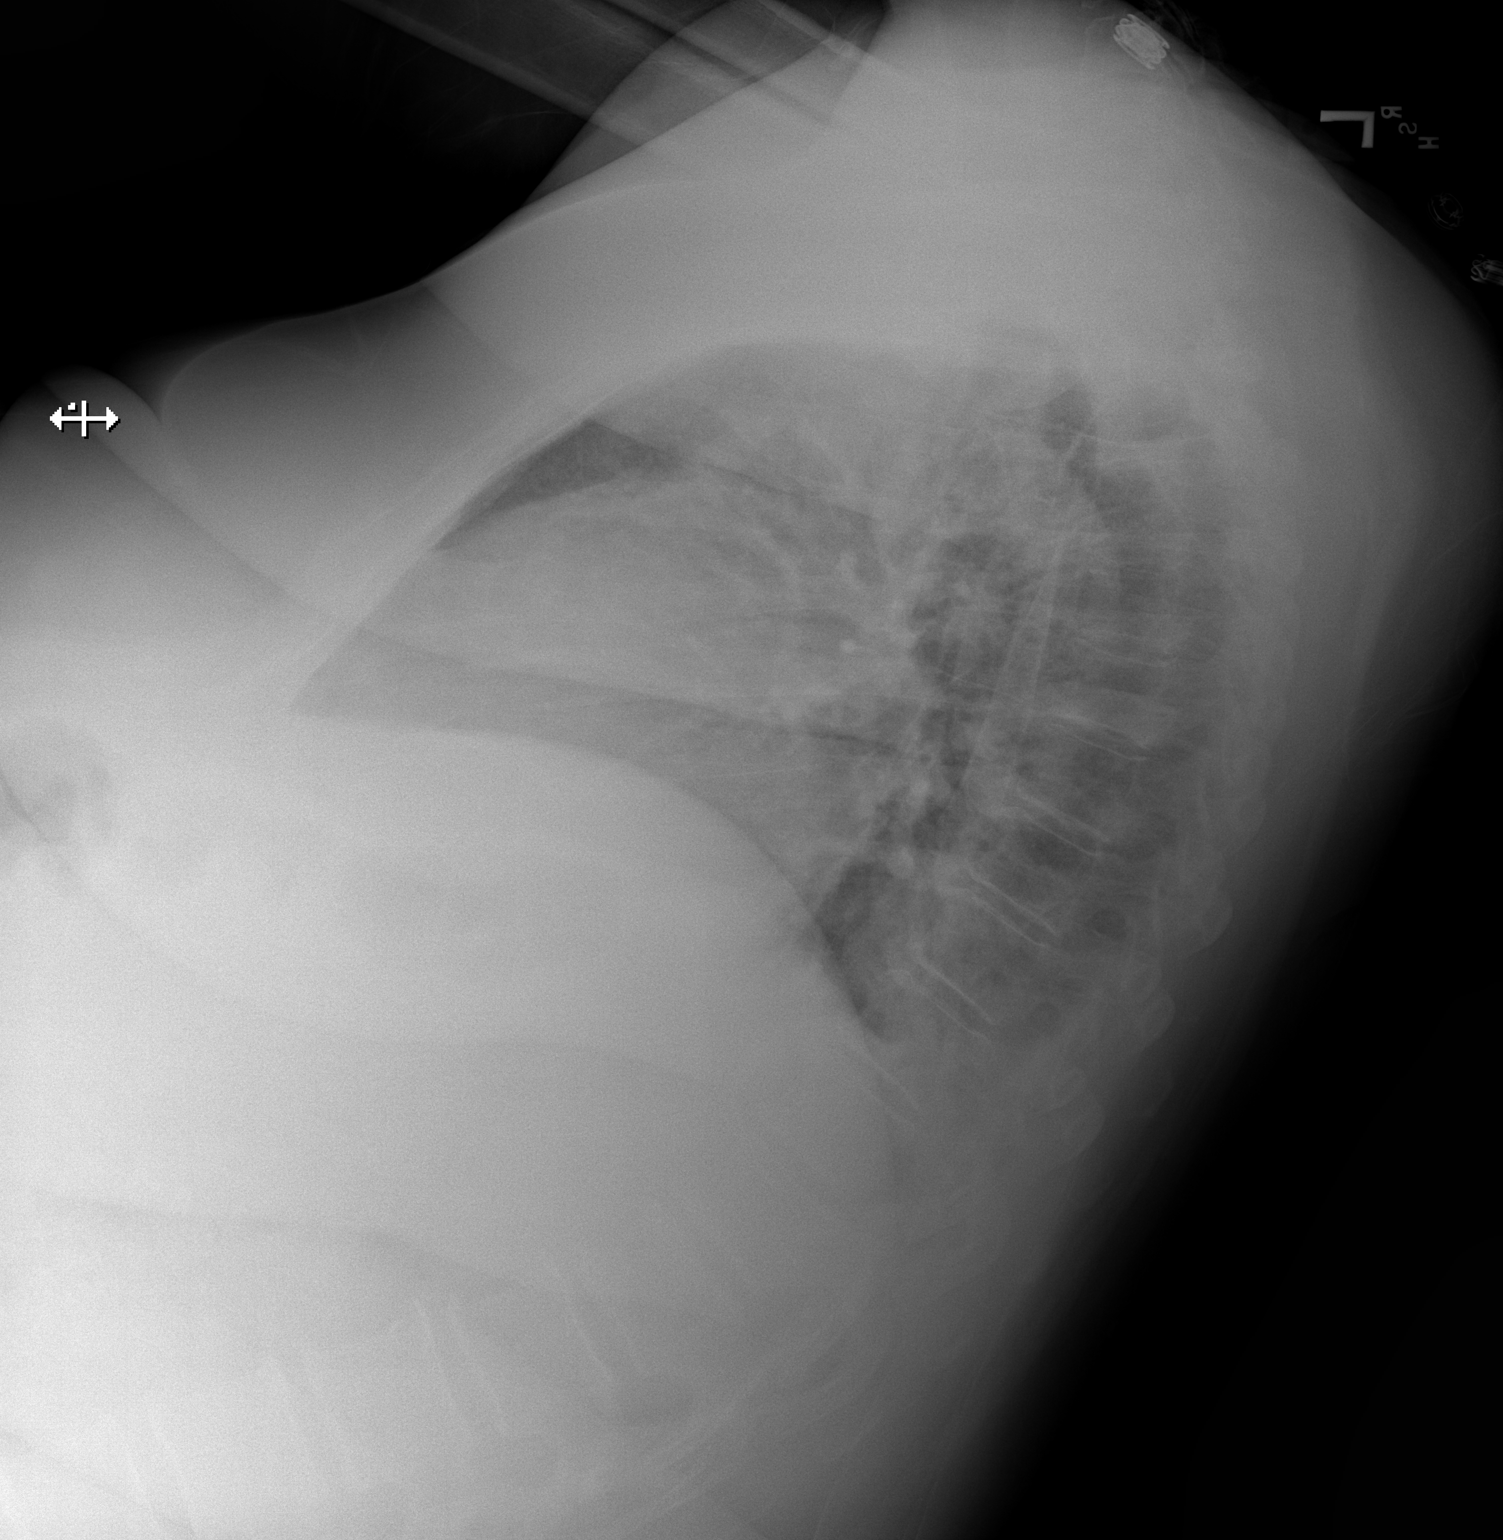

[2 of 2 positions shown; findings below may reference images not displayed]

FINDINGS: On the lateral view there is opacity posteriorly at the
lung base probably on the left suspicious for pneumonia or
effusion.  The right lung appears clear.  Mild cardiomegaly is
stable.  No bony abnormality is seen.
IMPRESSION: Opacity posteriorly on the lateral view suspicious for left lower
lobe patchy pneumonia or left effusion.

## 2012-12-11 ENCOUNTER — Non-Acute Institutional Stay (SKILLED_NURSING_FACILITY): Payer: Medicare Other | Admitting: Adult Health

## 2012-12-11 DIAGNOSIS — K5900 Constipation, unspecified: Secondary | ICD-10-CM

## 2012-12-11 DIAGNOSIS — K219 Gastro-esophageal reflux disease without esophagitis: Secondary | ICD-10-CM

## 2012-12-11 DIAGNOSIS — I10 Essential (primary) hypertension: Secondary | ICD-10-CM

## 2012-12-11 DIAGNOSIS — E039 Hypothyroidism, unspecified: Secondary | ICD-10-CM

## 2012-12-11 DIAGNOSIS — J449 Chronic obstructive pulmonary disease, unspecified: Secondary | ICD-10-CM

## 2012-12-11 DIAGNOSIS — F259 Schizoaffective disorder, unspecified: Secondary | ICD-10-CM

## 2012-12-11 DIAGNOSIS — M545 Low back pain: Secondary | ICD-10-CM

## 2012-12-16 IMAGING — CR DG CHEST 1V PORT
1 series · 1 of 1 positions shown · non-contrast
Comparison: 07/31/2011 and earlier

CLINICAL DATA: Chest pain today.  History of pneumonia and urinary
tract infection. History of hypertension, diabetes.  History of
kidney failure, CHF.

PORTABLE CHEST - 1 VIEW

[view not recorded]
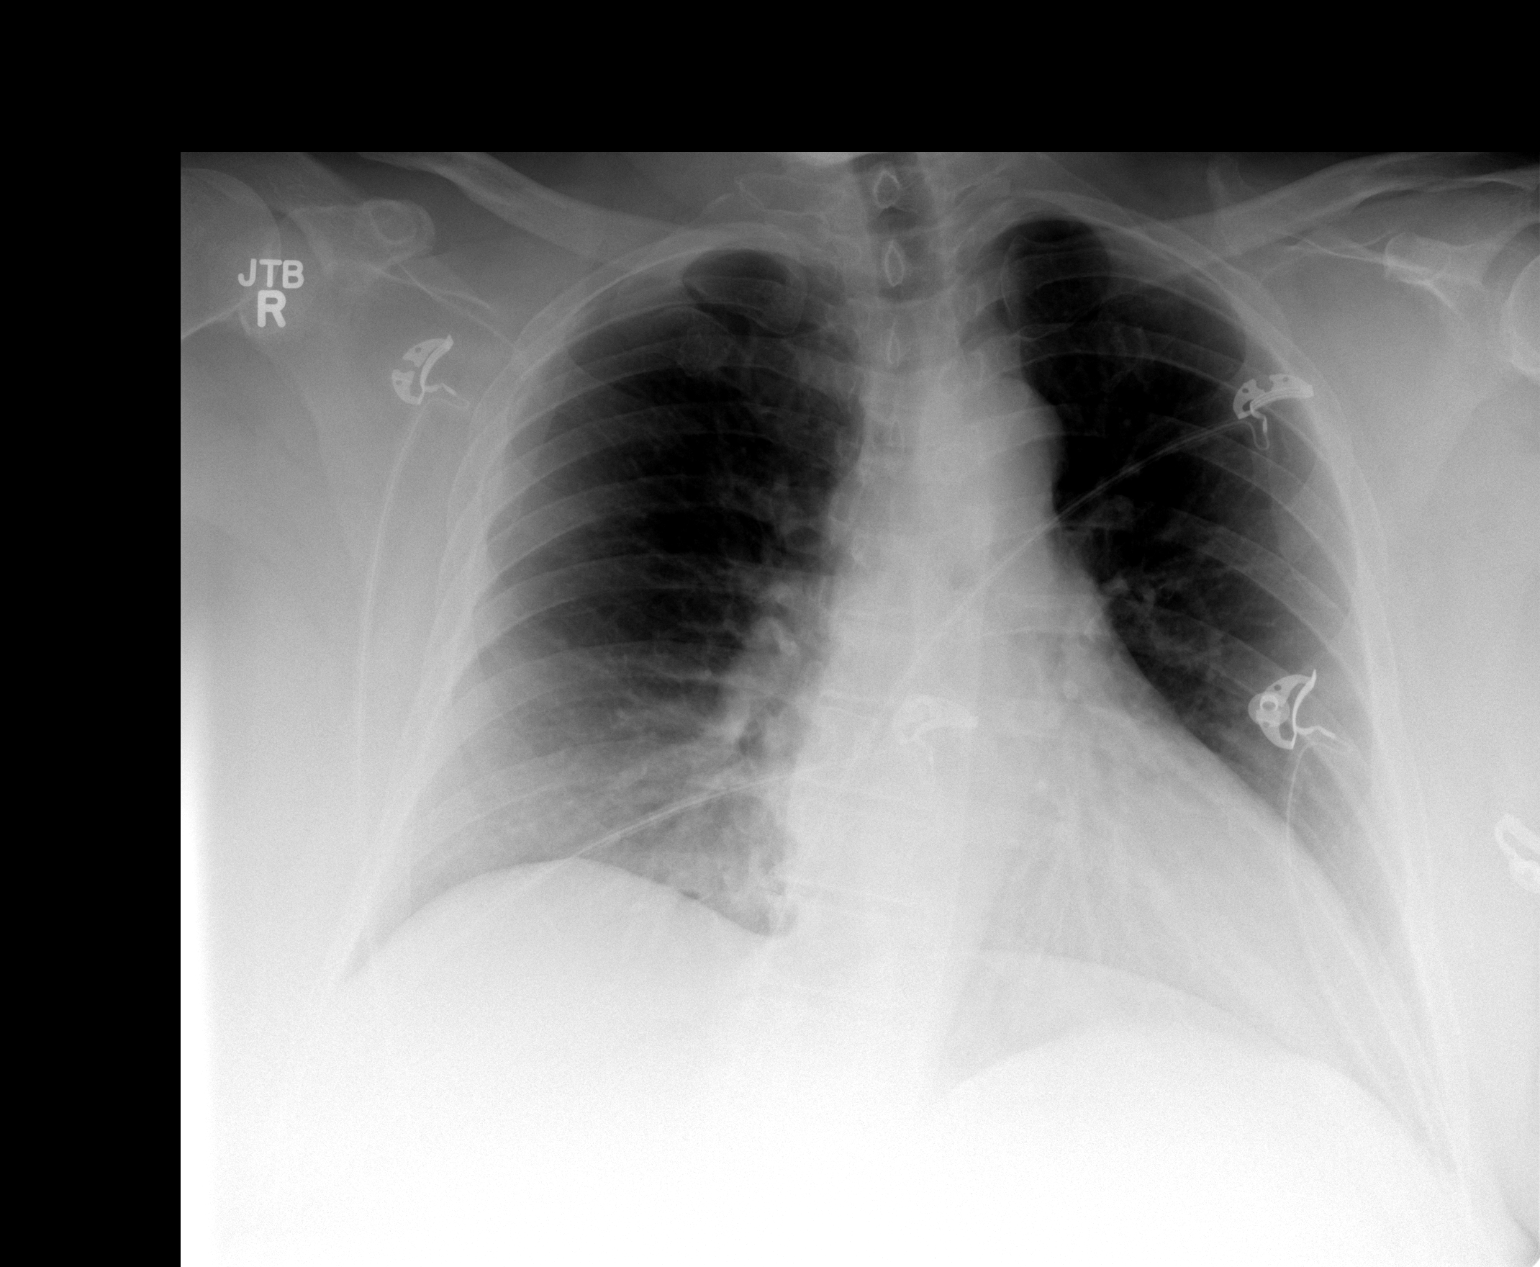

[1 of 1 positions shown; findings below may reference images not displayed]

FINDINGS: Heart is enlarged.  No focal consolidations or pleural
effusions.  No edema. Visualized osseous structures have a normal
appearance.
IMPRESSION: Cardiomegaly.

## 2013-01-02 ENCOUNTER — Other Ambulatory Visit: Payer: Self-pay | Admitting: Geriatric Medicine

## 2013-01-02 MED ORDER — TRAMADOL HCL 50 MG PO TABS: 50.0000 mg | ORAL_TABLET | Freq: Four times a day (QID) | ORAL | Status: DC

## 2013-01-09 IMAGING — CR DG CHEST 1V PORT
1 series · 1 of 1 positions shown · non-contrast
Comparison: 08/19/2011

CLINICAL DATA: Chest pain.

PORTABLE CHEST - 1 VIEW

[view not recorded]
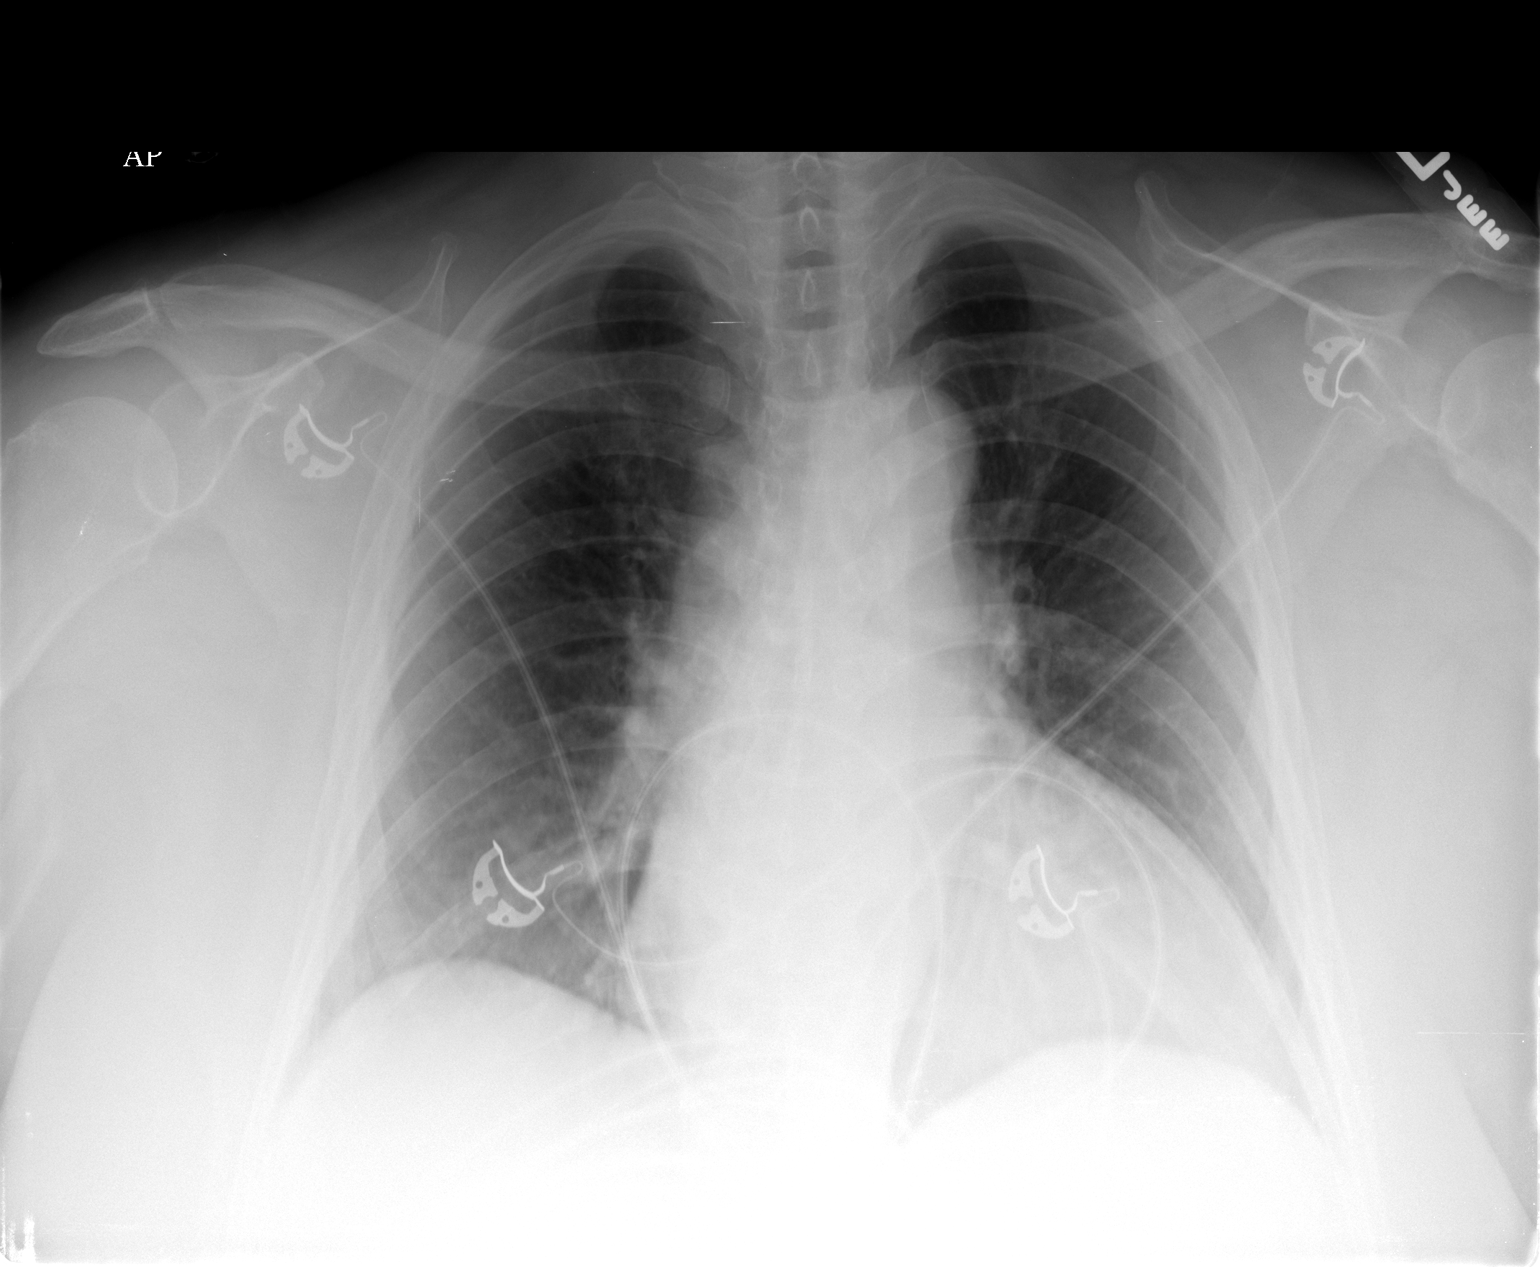

[1 of 1 positions shown; findings below may reference images not displayed]

FINDINGS: The heart is enlarged.  No focal consolidations or
pleural effusions.  No pulmonary edema. Visualized osseous
structures have a normal appearance.
IMPRESSION: Cardiomegaly.

## 2013-01-12 IMAGING — CR DG CHEST 2V
2 series · 2 of 2 positions shown · non-contrast
Comparison: 09/12/2011

CLINICAL DATA: Chest pain with hypertension, diabetes, and COPD.

CHEST - 2 VIEW

[w chest lat]
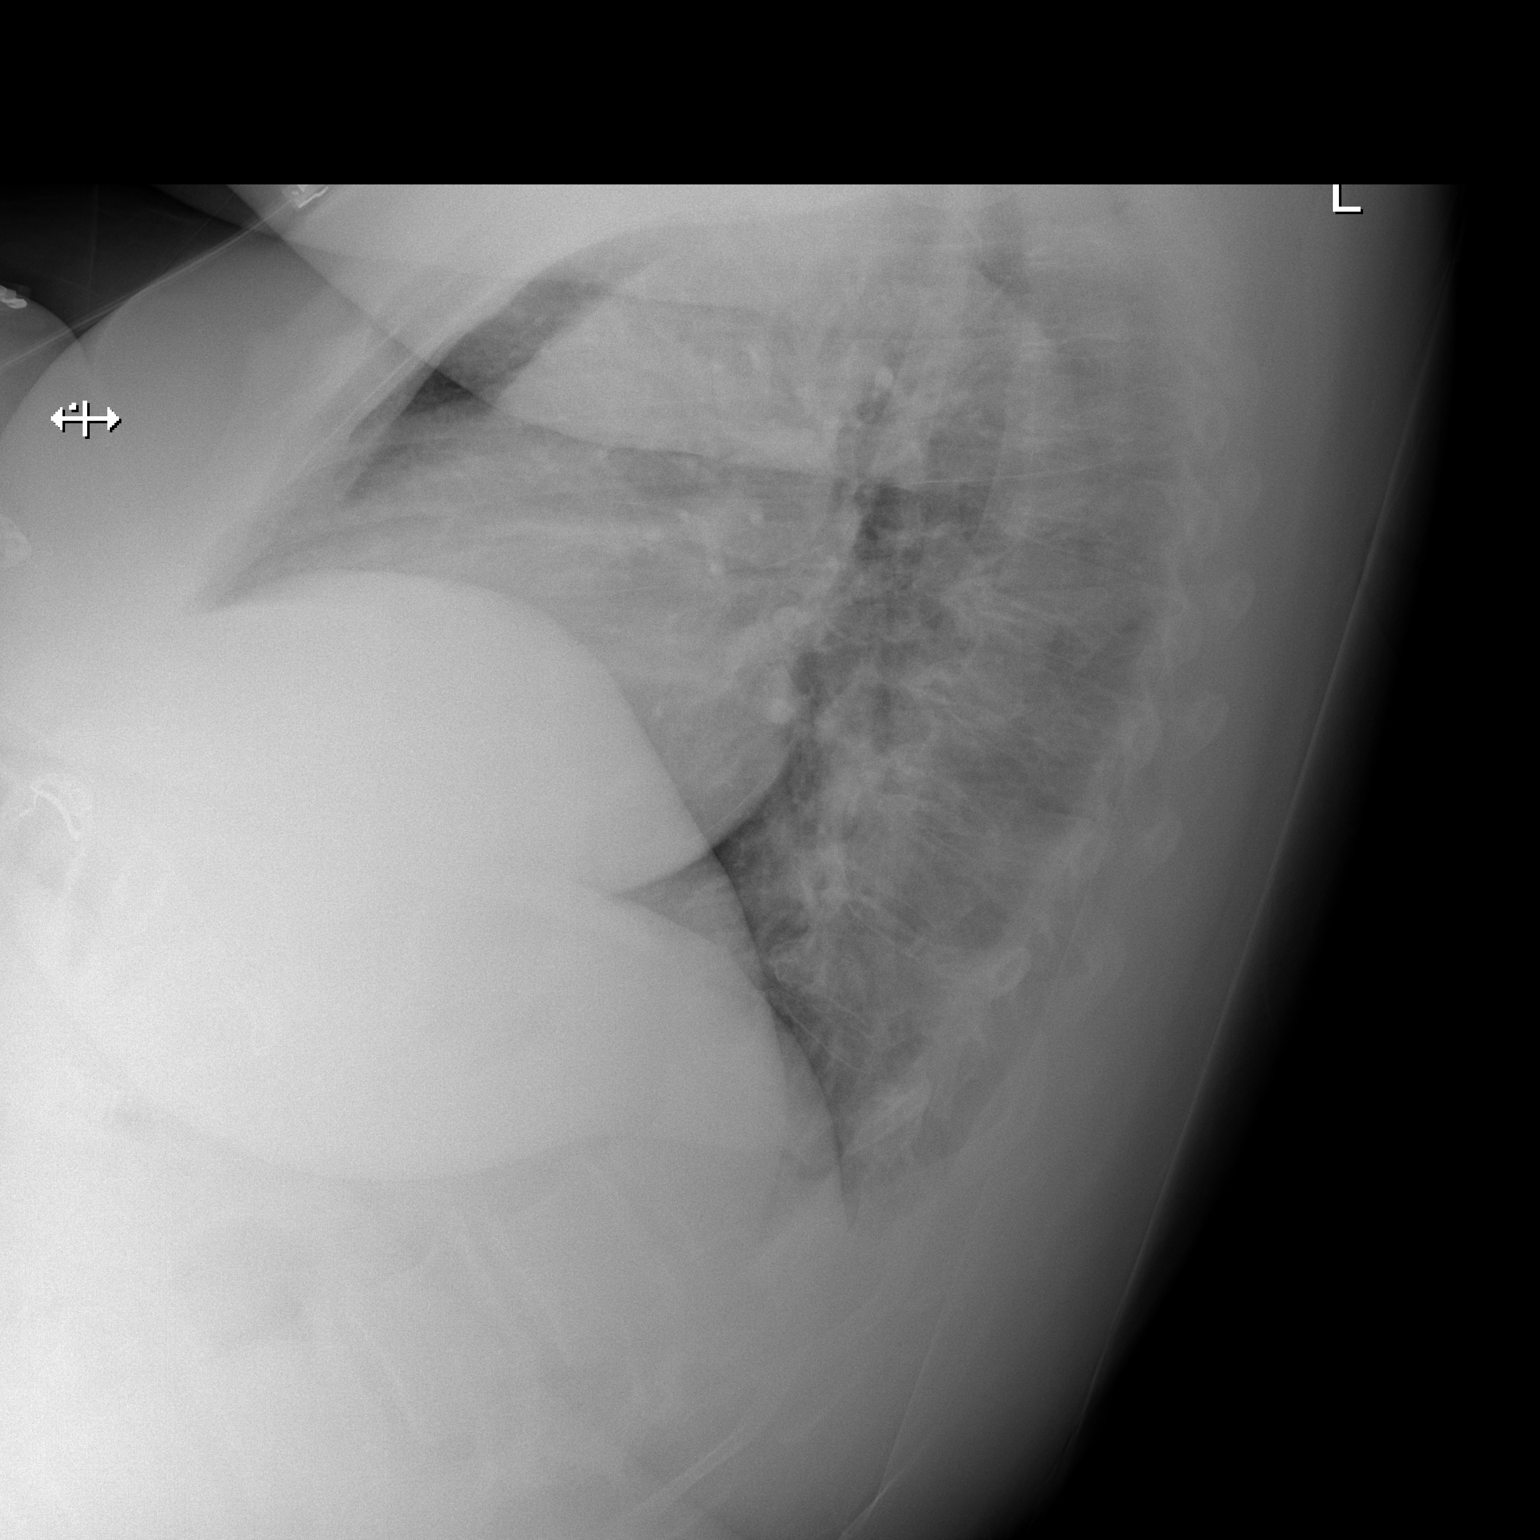

[x chest ap]
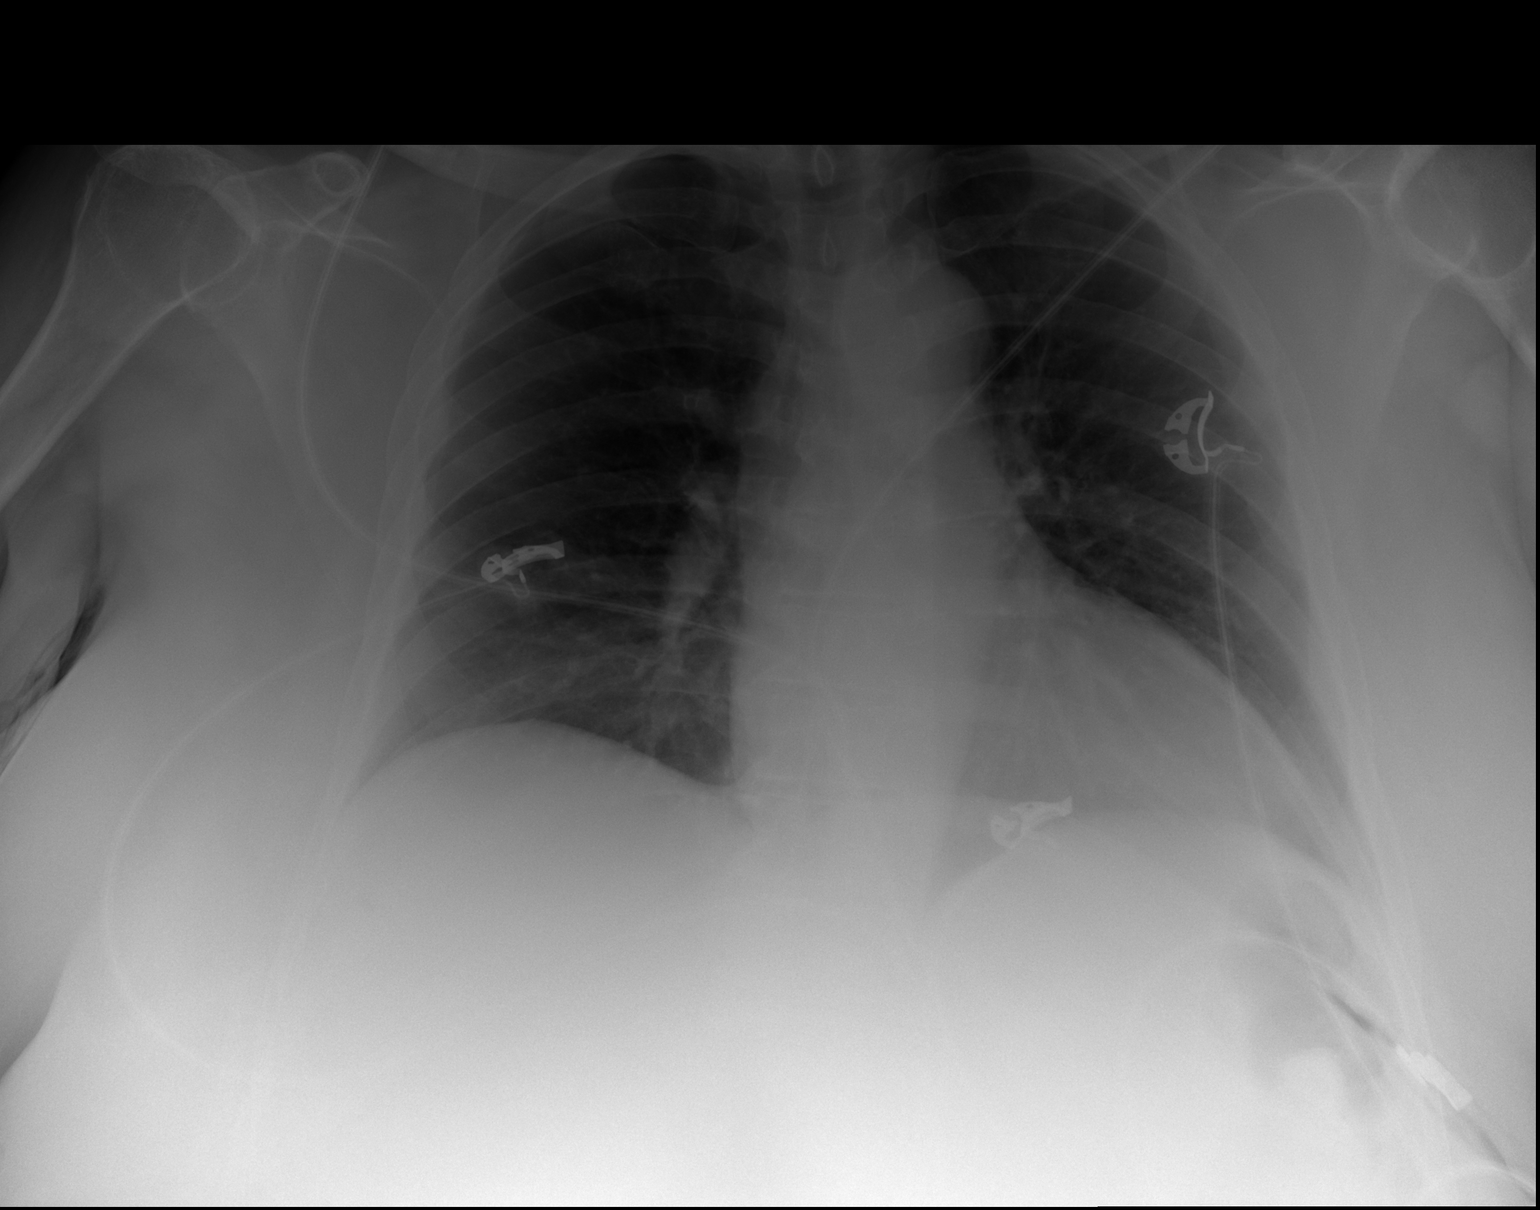

[2 of 2 positions shown; findings below may reference images not displayed]

FINDINGS: Cardiomegaly.  No focal consolidation, pleural effusion,
or pneumothorax.  Increased body habitus.  Unremarkable osseous
structures.  Little change from priors.
IMPRESSION: Cardiomegaly.  No acute cardiopulmonary disease.

## 2013-02-05 IMAGING — CR DG CHEST 2V
2 series · 2 of 2 positions shown · non-contrast
Comparison: 09/15/2011

CLINICAL DATA: Shortness of breath, chest pain, abdominal pain,
cough.

CHEST - 2 VIEW

[w chest lat]
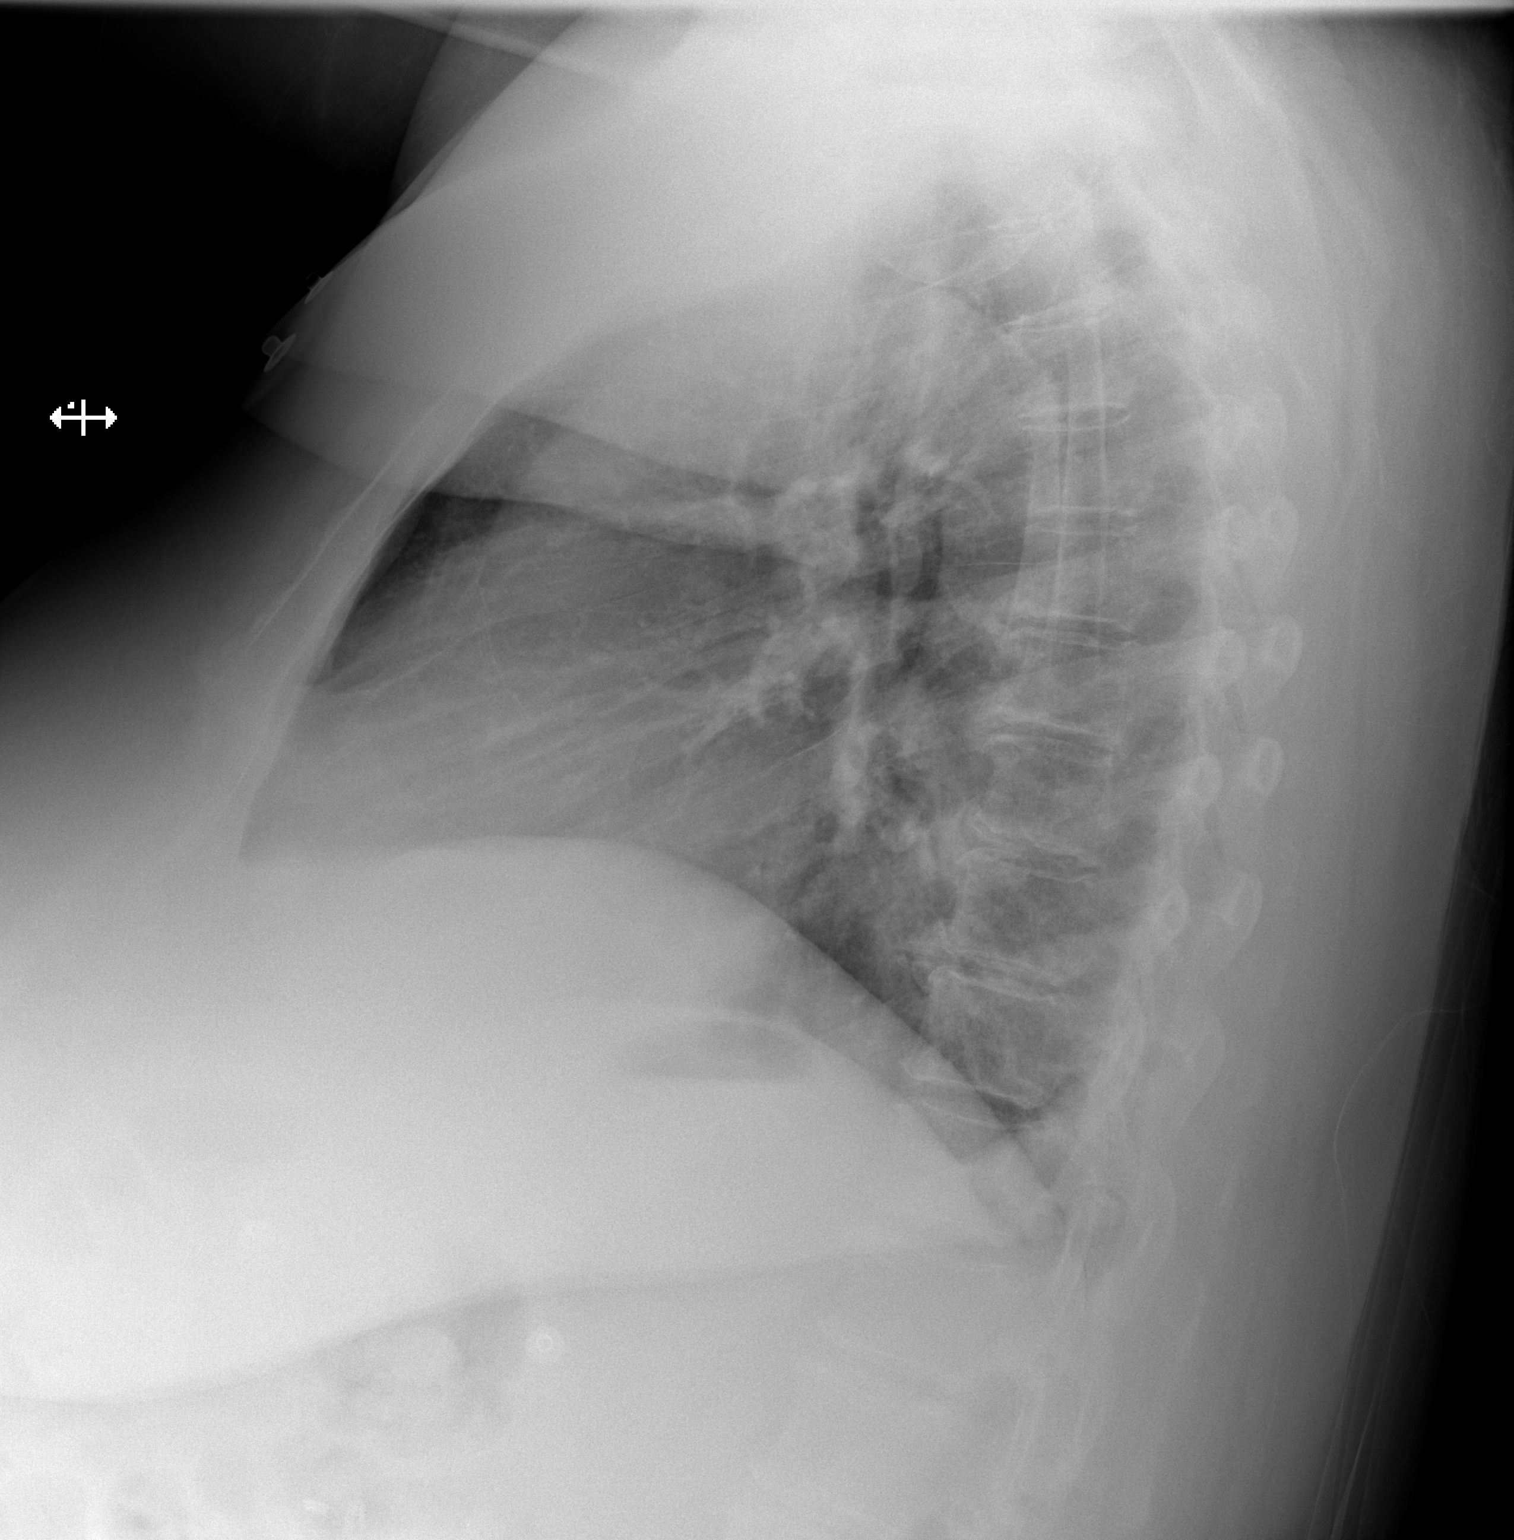

[x chest ap]
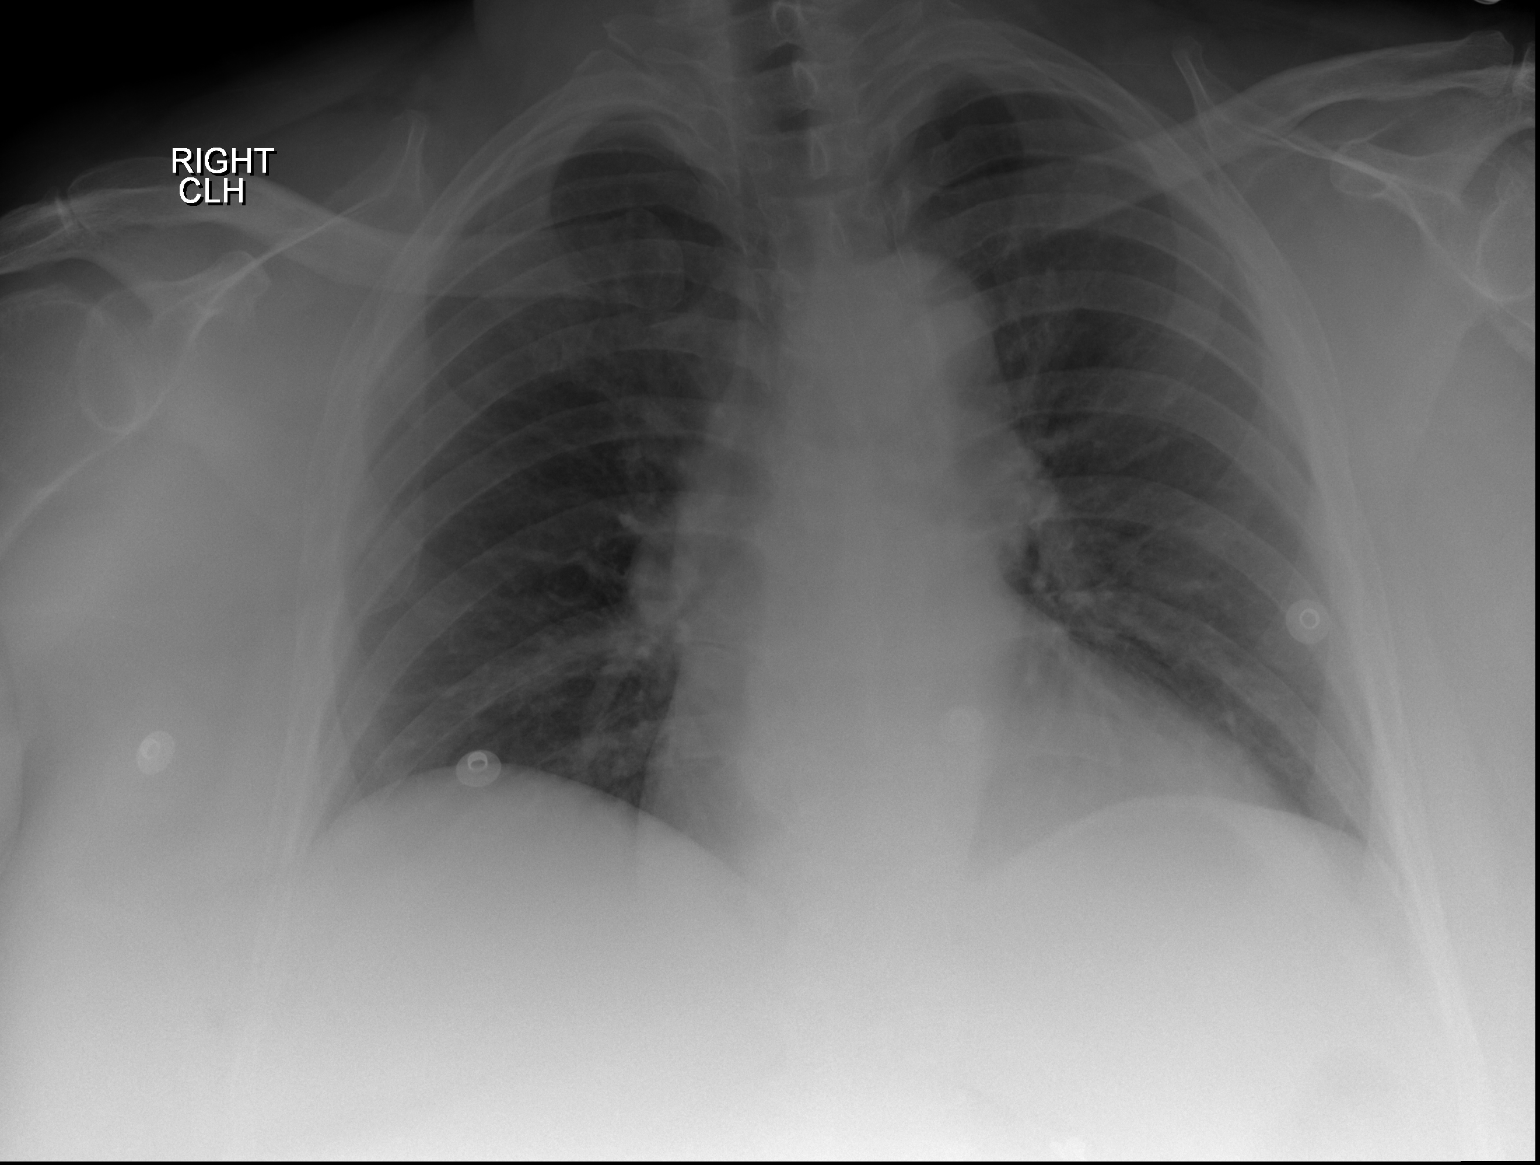

[2 of 2 positions shown; findings below may reference images not displayed]

FINDINGS: Shallow inspiration.  Mild cardiac enlargement with
normal pulmonary vascularity.  No focal airspace consolidation in
the lungs.  No blunting of costophrenic angles.  No pneumothorax.
No significant change since the previous study.  Degenerative
changes in the spine.
IMPRESSION: Mild cardiac enlargement.  No evidence of active pulmonary disease.

## 2013-02-12 ENCOUNTER — Emergency Department (HOSPITAL_COMMUNITY)
Admission: EM | Admit: 2013-02-12 | Discharge: 2013-02-12 | Disposition: A | Discharge status: Skilled Nursing Facility | Payer: Medicare Other | Attending: Emergency Medicine | Admitting: Emergency Medicine

## 2013-02-12 ENCOUNTER — Emergency Department (HOSPITAL_COMMUNITY): Payer: Medicare Other

## 2013-02-12 ENCOUNTER — Encounter (HOSPITAL_COMMUNITY): Payer: Self-pay | Admitting: Emergency Medicine

## 2013-02-12 DIAGNOSIS — G40909 Epilepsy, unspecified, not intractable, without status epilepticus: Secondary | ICD-10-CM | POA: Insufficient documentation

## 2013-02-12 DIAGNOSIS — Z8701 Personal history of pneumonia (recurrent): Secondary | ICD-10-CM | POA: Insufficient documentation

## 2013-02-12 DIAGNOSIS — Z862 Personal history of diseases of the blood and blood-forming organs and certain disorders involving the immune mechanism: Secondary | ICD-10-CM | POA: Insufficient documentation

## 2013-02-12 DIAGNOSIS — M129 Arthropathy, unspecified: Secondary | ICD-10-CM | POA: Insufficient documentation

## 2013-02-12 DIAGNOSIS — J45901 Unspecified asthma with (acute) exacerbation: Secondary | ICD-10-CM | POA: Insufficient documentation

## 2013-02-12 DIAGNOSIS — E039 Hypothyroidism, unspecified: Secondary | ICD-10-CM | POA: Insufficient documentation

## 2013-02-12 DIAGNOSIS — E119 Type 2 diabetes mellitus without complications: Secondary | ICD-10-CM | POA: Insufficient documentation

## 2013-02-12 DIAGNOSIS — Z7982 Long term (current) use of aspirin: Secondary | ICD-10-CM | POA: Insufficient documentation

## 2013-02-12 DIAGNOSIS — I209 Angina pectoris, unspecified: Secondary | ICD-10-CM | POA: Insufficient documentation

## 2013-02-12 DIAGNOSIS — Z8709 Personal history of other diseases of the respiratory system: Secondary | ICD-10-CM | POA: Insufficient documentation

## 2013-02-12 DIAGNOSIS — G8929 Other chronic pain: Secondary | ICD-10-CM | POA: Insufficient documentation

## 2013-02-12 DIAGNOSIS — Z8659 Personal history of other mental and behavioral disorders: Secondary | ICD-10-CM | POA: Insufficient documentation

## 2013-02-12 DIAGNOSIS — I10 Essential (primary) hypertension: Secondary | ICD-10-CM | POA: Insufficient documentation

## 2013-02-12 DIAGNOSIS — Z880 Allergy status to penicillin: Secondary | ICD-10-CM | POA: Insufficient documentation

## 2013-02-12 DIAGNOSIS — Z87440 Personal history of urinary (tract) infections: Secondary | ICD-10-CM | POA: Insufficient documentation

## 2013-02-12 DIAGNOSIS — F22 Delusional disorders: Secondary | ICD-10-CM | POA: Insufficient documentation

## 2013-02-12 DIAGNOSIS — Z8679 Personal history of other diseases of the circulatory system: Secondary | ICD-10-CM | POA: Insufficient documentation

## 2013-02-12 DIAGNOSIS — R079 Chest pain, unspecified: Secondary | ICD-10-CM | POA: Insufficient documentation

## 2013-02-12 DIAGNOSIS — Z8719 Personal history of other diseases of the digestive system: Secondary | ICD-10-CM | POA: Insufficient documentation

## 2013-02-12 DIAGNOSIS — R52 Pain, unspecified: Secondary | ICD-10-CM | POA: Insufficient documentation

## 2013-02-12 DIAGNOSIS — Z87891 Personal history of nicotine dependence: Secondary | ICD-10-CM | POA: Insufficient documentation

## 2013-02-12 DIAGNOSIS — I252 Old myocardial infarction: Secondary | ICD-10-CM | POA: Insufficient documentation

## 2013-02-12 DIAGNOSIS — F17200 Nicotine dependence, unspecified, uncomplicated: Secondary | ICD-10-CM | POA: Insufficient documentation

## 2013-02-12 DIAGNOSIS — Z8639 Personal history of other endocrine, nutritional and metabolic disease: Secondary | ICD-10-CM | POA: Insufficient documentation

## 2013-02-12 DIAGNOSIS — F29 Unspecified psychosis not due to a substance or known physiological condition: Secondary | ICD-10-CM | POA: Insufficient documentation

## 2013-02-12 DIAGNOSIS — I509 Heart failure, unspecified: Secondary | ICD-10-CM | POA: Insufficient documentation

## 2013-02-12 DIAGNOSIS — K219 Gastro-esophageal reflux disease without esophagitis: Secondary | ICD-10-CM | POA: Insufficient documentation

## 2013-02-12 DIAGNOSIS — IMO0002 Reserved for concepts with insufficient information to code with codable children: Secondary | ICD-10-CM | POA: Insufficient documentation

## 2013-02-12 LAB — CBC WITH DIFFERENTIAL/PLATELET
Eosinophils Absolute: 0.1 10*3/uL (ref 0.0–0.7)
Eosinophils Relative: 1 % (ref 0–5)
Hemoglobin: 12.3 g/dL (ref 12.0–15.0)
Lymphs Abs: 2.4 10*3/uL (ref 0.7–4.0)
MCH: 33.9 pg (ref 26.0–34.0)
MCV: 99.7 fL (ref 78.0–100.0)
Monocytes Relative: 7 % (ref 3–12)
RBC: 3.63 MIL/uL — ABNORMAL LOW (ref 3.87–5.11)

## 2013-02-12 LAB — BASIC METABOLIC PANEL
BUN: 12 mg/dL (ref 6–23)
Calcium: 9.9 mg/dL (ref 8.4–10.5)
Creatinine, Ser: 0.58 mg/dL (ref 0.50–1.10)
GFR calc non Af Amer: >90 mL/min (ref >90)
Glucose, Bld: 96 mg/dL (ref 70–99)

## 2013-02-12 LAB — URINALYSIS, ROUTINE W REFLEX MICROSCOPIC
Bilirubin Urine: NEGATIVE
Hgb urine dipstick: NEGATIVE
Protein, ur: NEGATIVE mg/dL
Urobilinogen, UA: 1 mg/dL (ref 0.0–1.0)

## 2013-02-12 LAB — URINE MICROSCOPIC-ADD ON

## 2013-02-12 NOTE — ED Notes (Signed)
Pt to ED via EMS with c/o generalize body ache. Per EMS, pt wants to come ED because she wants to leave the [facility] skill facility. BP-126/86, HR-84, SpO2-96% on room air.

## 2013-02-12 NOTE — ED Provider Notes (Signed)
CSN: [id number]     Arrival date & time 02/12/13  1139 History   First MD Initiated Contact with Patient 02/12/13 1242     Chief Complaint  Patient presents with  . Generalized Body Aches   (Consider location/radiation/quality/duration/timing/severity/associated sxs/prior Treatment) HPI Comments: Pt with multiple past medical problems including psychosis -- presents with c/o generalized myalgias, chest pain. Upon walking into the room she states that she does not want to go back to her current nursing facility. From EMS reports her current mental status is at her baseline. Level V caveat due to altered mental status, psychosis.   The history is provided by the patient and medical records.    Past Medical History  Diagnosis Date  . Schizoaffective disorder   . Urinary tract infection   . Hypertension   . GERD (gastroesophageal reflux disease)   . Hypothyroidism   . Obesity   . Asthma   . COPD (chronic obstructive pulmonary disease)   . CHF (congestive heart failure)   . Seizures   . Morbid obesity   . Chronic pain   . Hypercholesteremia   . Anginal pain   . Myocardial infarction 2002  . Pneumonia     "several times" (05/23/2012)  . Diabetes mellitus     "I'm not diabetic anymore" (05/23/2012)  . Anemia   . History of blood transfusion 1993    "when I had my hysterectomy" (05/23/2012)  . H/O hiatal hernia   . Daily headache   . Arthritis     "severe; all over my body" (05/23/2012)  . Hypoventilation syndrome     [name] 05/30/2012  . Atrial fibrillation   . Atrial flutter, paroxysmal     [name] 05/30/2012  . Exertional dyspnea   . Shortness of breath     "all the time lately" (05/30/2012)   Past Surgical History  Procedure Laterality Date  . Vaginal hysterectomy  1993  . Tubal ligation  1998  . Cholecystectomy  ?2008  . Cardiac catheterization  2008   Family History  Problem Relation Age of Onset  . Heart disease Father   . Cancer Mother 53    Breast   History   Substance Use Topics  . Smoking status: Former Smoker -- 2.00 packs/day for 12 years    Types: Cigarettes    Quit date: 06/29/2000  . Smokeless tobacco: Current User    Types: Snuff, Chew     Comment: 05/30/2012 "still have what you gave me last time"  . Alcohol Use: No   OB History   Grav Para Term Preterm Abortions TAB SAB Ect Mult Living                 Review of Systems  Unable to perform ROS: Psychiatric disorder    Allergies  Food; Penicillins; and Sulfamethoxazole w-trimethoprim  Home Medications   Current Outpatient Rx  Name  Route  Sig  Dispense  Refill  . aspirin EC 81 MG tablet   Oral   Take 81 mg by mouth every morning.          . budesonide (PULMICORT) 0.25 MG/2ML nebulizer solution   Nebulization   Take 0.25 mg by nebulization 2 (two) times daily.         . divalproex (DEPAKOTE ER) 250 MG 24 hr tablet   Oral   Take 250 mg by mouth at bedtime.         . fluticasone (FLONASE) 50 MCG/ACT nasal spray   Nasal  Place 2 sprays into the nose daily. For nasal congestion         . levothyroxine (SYNTHROID, LEVOTHROID) 150 MCG tablet   Oral   Take 150 mcg by mouth every morning. For thyroid disease.         Marland Kitchen LORazepam (ATIVAN) 0.5 MG tablet   Oral   Take 0.5 mg by mouth every 12 (twelve) hours as needed for anxiety.         . metoprolol succinate (TOPROL-XL) 50 MG 24 hr tablet   Oral   Take 50 mg by mouth daily. Take with or immediately following a meal.         . Multiple Vitamin (MULTIVITAMIN WITH MINERALS) TABS   Oral   Take 1 tablet by mouth every morning. For nutritional supplement.         . nitroGLYCERIN (NITROSTAT) 0.4 MG SL tablet   Sublingual   Place 0.4 mg under the tongue every 5 (five) minutes as needed for chest pain.         Marland Kitchen omeprazole (PRILOSEC) 20 MG capsule   Oral   Take 20 mg by mouth every morning.          . paliperidone ([name]) 3 MG 24 hr tablet   Oral   Take 12 mg by mouth daily.          .  polyethylene glycol (MIRALAX / GLYCOLAX) packet   Oral   Take 17 g by mouth 2 (two) times a week. Tuesday and Friday         . PRESCRIPTION MEDICATION   Topical   Apply 1 application topically every 12 (twelve) hours as needed (anxiety). Lorazepam Gel 0.5 mg         . traMADol (ULTRAM) 50 MG tablet   Oral   Take 1 tablet (50 mg total) by mouth 4 (four) times daily.   120 tablet   5   . albuterol (PROVENTIL HFA;VENTOLIN HFA) 108 (90 BASE) MCG/ACT inhaler   Inhalation   Inhale 2 puffs into the lungs every 6 (six) hours as needed for wheezing.          BP 125/77  Pulse 76  Temp(Src) 98.9 F (37.2 C) (Oral)  Resp 18  SpO2 96% Physical Exam  Nursing note and vitals reviewed. Constitutional: She appears well-developed and well-nourished.  HENT:  Head: Normocephalic and atraumatic.  Eyes: Conjunctivae are normal. Right eye exhibits no discharge. Left eye exhibits no discharge.  Neck: Normal range of motion. Neck supple.  Cardiovascular: Normal rate, regular rhythm and normal heart sounds.   Pulmonary/Chest: Effort normal and breath sounds normal.  Abdominal: Soft. Bowel sounds are normal. There is no tenderness. There is no rebound and no guarding.  Neurological: She is alert. She exhibits normal muscle tone. Coordination normal.  Skin: Skin is warm and dry.  Psychiatric: Her affect is inappropriate. Thought content is delusional. She expresses inappropriate judgment.    ED Course  Procedures (including critical care time) Labs Review Labs Reviewed  CBC WITH DIFFERENTIAL - Abnormal; Notable for the following:    RBC 3.63 (*)    All other components within normal limits  URINALYSIS, ROUTINE W REFLEX MICROSCOPIC - Abnormal; Notable for the following:    APPearance CLOUDY (*)    Leukocytes, UA LARGE (*)    All other components within normal limits  URINE MICROSCOPIC-ADD ON - Abnormal; Notable for the following:    Squamous Epithelial / LPF FEW (*)    All other  components within normal limits  BASIC METABOLIC PANEL  POCT I-STAT TROPONIN I   Imaging Review Dg Chest 2 View  02/12/2013   *RADIOLOGY REPORT*  Clinical Data: Generalized body aches  CHEST - 2 VIEW  Comparison: 09/23/2012  Findings: Cardiac enlargement without heart failure.  Lungs are clear without infiltrate effusion or mass lesion.  No change from the prior study.  IMPRESSION: Cardiac enlargement.  No acute abnormality.   Original Report Authenticated By: Janeece Riggers, M.D.    1:16 PM Patient seen and examined. Work-up initiated.   Vital signs reviewed and are as follows: Filed Vitals:   02/12/13 1215  BP: 125/77  Pulse: 76  Temp:   Resp:    2:52 PM D/w Dr. Deretha Emory who will see.   5:49 PM Work-up is reassuring. Pt is now happy to go back to her facility. Nurse Melony Overly) contacted NH and they have no further concerns. Will d/c. Pt urged to f/u with PCP.   MDM   1. Body aches    Patient with multiple vague complaints, psychosis, poor historian. No dangerous or concerning medical conditions which would require inpatient hospitalization identified during ED visit today. Feel she is stable for discharge. Pt appears well, non-toxic. She is at her baseline mental state.       Renne Crigler, PA-C 02/12/13 1753

## 2013-02-12 NOTE — ED Provider Notes (Signed)
Medical screening examination/treatment/procedure(s) were conducted as a shared visit with non-physician practitioner(s) and myself.  I personally evaluated the patient during the encounter  Results for orders placed during the hospital encounter of 02/12/13  CBC WITH DIFFERENTIAL      Result Value Range   WBC 7.8  4.0 - 10.5 K/uL   RBC 3.63 (*) 3.87 - 5.11 MIL/uL   Hemoglobin 12.3  12.0 - 15.0 g/dL   HCT 09.8  11.9 - 14.7 %   MCV 99.7  78.0 - 100.0 fL   MCH 33.9  26.0 - 34.0 pg   MCHC 34.0  30.0 - 36.0 g/dL   RDW 82.9  56.2 - 13.0 %   Platelets 212  150 - 400 K/uL   Neutrophils Relative % 61  43 - 77 %   Neutro Abs 4.8  1.7 - 7.7 K/uL   Lymphocytes Relative 31  12 - 46 %   Lymphs Abs 2.4  0.7 - 4.0 K/uL   Monocytes Relative 7  3 - 12 %   Monocytes Absolute 0.5  0.1 - 1.0 K/uL   Eosinophils Relative 1  0 - 5 %   Eosinophils Absolute 0.1  0.0 - 0.7 K/uL   Basophils Relative 0  0 - 1 %   Basophils Absolute 0.0  0.0 - 0.1 K/uL  BASIC METABOLIC PANEL      Result Value Range   Sodium 136  135 - 145 mEq/L   Potassium 3.9  3.5 - 5.1 mEq/L   Chloride 99  96 - 112 mEq/L   CO2 31  19 - 32 mEq/L   Glucose, Bld 96  70 - 99 mg/dL   BUN 12  6 - 23 mg/dL   Creatinine, Ser 8.65  0.50 - 1.10 mg/dL   Calcium 9.9  8.4 - 78.4 mg/dL   GFR calc non Af Amer >90  >90 mL/min   GFR calc Af Amer >90  >90 mL/min  URINALYSIS, ROUTINE W REFLEX MICROSCOPIC      Result Value Range   Color, Urine YELLOW  YELLOW   APPearance CLOUDY (*) CLEAR   Specific Gravity, Urine 1.014  1.005 - 1.030   pH 8.0  5.0 - 8.0   Glucose, UA NEGATIVE  NEGATIVE mg/dL   Hgb urine dipstick NEGATIVE  NEGATIVE   Bilirubin Urine NEGATIVE  NEGATIVE   Ketones, ur NEGATIVE  NEGATIVE mg/dL   Protein, ur NEGATIVE  NEGATIVE mg/dL   Urobilinogen, UA 1.0  0.0 - 1.0 mg/dL   Nitrite NEGATIVE  NEGATIVE   Leukocytes, UA LARGE (*) NEGATIVE  URINE MICROSCOPIC-ADD ON      Result Value Range   Squamous Epithelial / LPF FEW (*) RARE   WBC,  UA 3-6  <3 WBC/hpf   RBC / HPF 0-2  <3 RBC/hpf   Bacteria, UA RARE  RARE   Urine-Other MANY YEAST    POCT I-STAT TROPONIN I      Result Value Range   Troponin i, poc 0.00  0.00 - 0.08 ng/mL   Comment 3            Dg Chest 2 View  02/12/2013   *RADIOLOGY REPORT*  Clinical Data: Generalized body aches  CHEST - 2 VIEW  Comparison: 09/23/2012  Findings: Cardiac enlargement without heart failure.  Lungs are clear without infiltrate effusion or mass lesion.  No change from the prior study.  IMPRESSION: Cardiac enlargement.  No acute abnormality.   Original Report Authenticated By: Janeece Riggers, M.D.  Patient baseline mental status as per nursing facility. Patient was sent in for evaluation of generalized body pain and chest pain. Workup for that was negative. Nursing home willing to take patient back. Patient's mental status she is alert and at times confused. Patient clearly has some sores psychiatric history. The patient stable to go back to nursing facility. No evidence of urinary tract infection and no cardiac event. No evidence of any acute cardiac event.  Shelda Jakes, MD 02/12/13 [phone]

## 2013-02-13 ENCOUNTER — Non-Acute Institutional Stay (SKILLED_NURSING_FACILITY): Payer: Medicare Other | Admitting: Internal Medicine

## 2013-02-13 DIAGNOSIS — F250 Schizoaffective disorder, bipolar type: Secondary | ICD-10-CM

## 2013-02-13 DIAGNOSIS — F259 Schizoaffective disorder, unspecified: Secondary | ICD-10-CM

## 2013-02-13 DIAGNOSIS — F70 Mild intellectual disabilities: Secondary | ICD-10-CM

## 2013-02-13 NOTE — Progress Notes (Signed)
Patient ID: [name], female   DOB: [birth year], 62 y.o.   MRN: [id number] Location:    [facility] [name], D.O., C.M.D.  PCP: [name], MD  Code Status: full code  Allergies  Allergen Reactions  . Food Rash    "lasagna"= rash from the noodles  . Penicillins Hives  . Sulfamethoxazole W-Trimethoprim Hives and Nausea And Vomiting    Chief Complaint  Patient presents with  . Discharge Note    HPI:  62 yo obese white female with schizoaffective disorder, COPD, CHF, hypothyroidism, paroxysmal afib seen due to plans for discharge home.  Apparently, she does not qualify for a long term stay--long term PASSR could not be obtained.  Unfortunately, she is clearly psychotic when seen today--she believes she is pregnant with one child.  Staff notes that she called the police b/c she wanted help with her pregnancy with triplets.  In the ED, she was diagnosed with body aches.  Of note, she has been readmitted here twice after she did not take her medications at home where she was staying alone.  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  She remains morbidly obese, as well.  Staff require two people to clean her when she has a BM.       Review of Systems:  Review of Systems  Constitutional: Negative for fever and chills.       Morbid obesity  HENT: Negative for congestion.   Eyes: Negative for blurred vision.  Respiratory: Negative for shortness of breath.   Cardiovascular: Negative for chest pain.  Gastrointestinal: Negative for nausea, vomiting, abdominal pain and constipation.       Pt says she is pregnant  Genitourinary: Negative for dysuria.  Musculoskeletal: Negative for myalgias and falls.  Skin: Negative for rash.  Neurological: Positive for weakness. Negative for dizziness.  Psychiatric/Behavioral: Negative for suicidal ideas.       Delusional, psychotic     Past Medical  History  Diagnosis Date  . Schizoaffective disorder   . Urinary tract infection   . Hypertension   . GERD (gastroesophageal reflux disease)   . Hypothyroidism   . Obesity   . Asthma   . COPD (chronic obstructive pulmonary disease)   . CHF (congestive heart failure)   . Seizures   . Morbid obesity   . Chronic pain   . Hypercholesteremia   . Anginal pain   . Myocardial infarction 2002  . Pneumonia     "several times" (05/23/2012)  . Diabetes mellitus     "I'm not diabetic anymore" (05/23/2012)  . Anemia   . History of blood transfusion 1993    "when I had my hysterectomy" (05/23/2012)  . H/O hiatal hernia   . Daily headache   . Arthritis     "severe; all over my body" (05/23/2012)  . Hypoventilation syndrome     [name] 05/30/2012  . Atrial fibrillation   . Atrial flutter, paroxysmal     [name] 05/30/2012  . Exertional dyspnea   . Shortness of breath     "all the time lately" (05/30/2012)    Past Surgical History  Procedure Laterality Date  . Vaginal hysterectomy  1993  . Tubal ligation  1998  . Cholecystectomy  ?2008  . Cardiac catheterization  2008    Social History:   reports that she quit smoking about 12 years ago. Her smoking use  included Cigarettes. She has a 24 pack-year smoking history. Her smokeless tobacco use includes Snuff and Chew. She reports that she does not drink alcohol or use illicit drugs.  Family History  Problem Relation Age of Onset  . Heart disease Father   . Cancer Mother 30    Breast    Medications: Patient's Medications  New Prescriptions   No medications on file  Previous Medications   ALBUTEROL (PROVENTIL HFA;VENTOLIN HFA) 108 (90 BASE) MCG/ACT INHALER    Inhale 2 puffs into the lungs every 6 (six) hours as needed for wheezing.   ASPIRIN EC 81 MG TABLET    Take 81 mg by mouth every morning.    BUDESONIDE (PULMICORT) 0.25 MG/2ML NEBULIZER SOLUTION    Take 0.25 mg by nebulization 2 (two) times daily.   DIVALPROEX (DEPAKOTE ER) 250  MG 24 HR TABLET    Take 250 mg by mouth at bedtime.   FLUTICASONE (FLONASE) 50 MCG/ACT NASAL SPRAY    Place 2 sprays into the nose daily. For nasal congestion   LEVOTHYROXINE (SYNTHROID, LEVOTHROID) 150 MCG TABLET    Take 150 mcg by mouth every morning. For thyroid disease.   LORAZEPAM (ATIVAN) 0.5 MG TABLET    Take 0.5 mg by mouth every 12 (twelve) hours as needed for anxiety.   METOPROLOL SUCCINATE (TOPROL-XL) 50 MG 24 HR TABLET    Take 50 mg by mouth daily. Take with or immediately following a meal.   MULTIPLE VITAMIN (MULTIVITAMIN WITH MINERALS) TABS    Take 1 tablet by mouth every morning. For nutritional supplement.   NITROGLYCERIN (NITROSTAT) 0.4 MG SL TABLET    Place 0.4 mg under the tongue every 5 (five) minutes as needed for chest pain.   OMEPRAZOLE (PRILOSEC) 20 MG CAPSULE    Take 20 mg by mouth every morning.    PALIPERIDONE ([name]) 3 MG 24 HR TABLET    Take 12 mg by mouth daily.    POLYETHYLENE GLYCOL (MIRALAX / GLYCOLAX) PACKET    Take 17 g by mouth 2 (two) times a week. Tuesday and Friday   PRESCRIPTION MEDICATION    Apply 1 application topically every 12 (twelve) hours as needed (anxiety). Lorazepam Gel 0.5 mg   TRAMADOL (ULTRAM) 50 MG TABLET    Take 1 tablet (50 mg total) by mouth 4 (four) times daily.  Modified Medications   No medications on file  Discontinued Medications   No medications on file    Physical Exam: There were no vitals filed for this visit. Physical Exam  Constitutional:  Morbidly obese female in her bariatric bed, has two cups of snuff at bedside, one cup of cake  HENT:  Head: Normocephalic and atraumatic.  Eyes: EOM are normal. Pupils are equal, round, and reactive to light.  Neck: Normal range of motion. Neck supple.  Cardiovascular: Normal rate, regular rhythm, normal heart sounds and intact distal pulses.   Pulmonary/Chest: Effort normal and breath sounds normal. No respiratory distress.  Abdominal: Soft. Bowel sounds are normal. She exhibits no  distension. There is no tenderness.  Musculoskeletal: She exhibits no edema and no tenderness.  Left leg contracted partially and cannot be extended   Neurological: She is alert.  Oriented but delusional  Skin: Skin is warm and dry.  Psychiatric: Her affect is inappropriate. Thought content is delusional. Thought content is not paranoid. She expresses impulsivity and inappropriate judgment. She expresses no homicidal and no suicidal ideation. She expresses no suicidal plans and no homicidal plans.  Unusual affect  She is attentive.    Labs reviewed: Basic Metabolic Panel:  Recent Labs  16/10/96 1346  03/29/12 1140  06/02/12 1708 06/03/12 0605  07/30/12 0440  09/21/12 0525 09/23/12 1738 02/12/13 1316  NA 138  < > 134*  < > 137 140  < >  --   < > 138 139 136  K 4.3  < > 5.0  < > 4.5 4.5  < >  --   < > 3.4* 3.3* 3.9  CL 99  < > 95*  < > 100 100  < >  --   < > 104 101 99  CO2 35*  < > 30  < > 30 31  < >  --   < > 26 30 31   GLUCOSE 96  < > 104*  < > 101* 97  < >  --   < > 90 111* 96  BUN 12  < > 19  < > 13 10  < >  --   < > 9 8 12   CREATININE 0.85  < > 2.20*  < > 1.19* 1.00  < >  --   < > 0.65 0.82 0.58  CALCIUM 9.6  < > 9.7  < > 9.7 10.2  < >  --   < > 9.5 10.5 9.9  MG 1.7  --  1.9  --   --   --   --  1.7  --   --   --   --   PHOS  --   --  5.5*  < > 2.8 2.6  --  3.7  --   --   --   --   < > = values in this interval not displayed. Liver Function Tests:  Recent Labs  08/07/12 0216 08/15/12 1820 09/01/12 0552  AST 36 22 31  ALT 21 12 14   ALKPHOS 44 46 59  BILITOT 0.3 0.6 0.4  PROT 6.3 6.4 6.0  ALBUMIN 2.8* 3.0* 2.6*    Recent Labs  03/28/12 1457 03/29/12 1140 05/23/12 1428  LIPASE 40 44 25  CBC:  Recent Labs  09/02/12 0720  09/20/12 1529 09/21/12 0525 09/23/12 1738 02/12/13 1316  WBC 4.9  < > 10.0 5.7 7.9 7.8  NEUTROABS 2.4  --  6.8  --   --  4.8  HGB 12.6  < > 13.9 11.8* 14.9 12.3  HCT 38.8  < > 39.9 37.5 42.9 36.2  MCV 101.6*  < > 96.1 98.9 97.3 99.7   PLT 95*  < > 256 205 207 212  < > = values in this interval not displayed. Cardiac Enzymes:  Recent Labs  06/01/12 0730  06/02/12 1707 06/03/12 0605 06/03/12 1107 07/27/12 1717 08/15/12 1820  CKTOTAL  --   < > 44 28 42  --   --   CKMB  --   < > 1.7 1.7 1.7  --   --   TROPONINI <0.30  --   --   --   --  <0.30 <0.30  < > = values in this interval not displayed. CBG:  Recent Labs  09/05/12 0754 09/05/12 1212 09/21/12 0547  GLUCAP 87 93 91  11/02/12:  Wbc 7.9, hgb 12, hct 37.7, plts 258, TSH 1.854, Na 136, K 3.5, BUN 8, cr 0.6, alb 3.5, transaminases nl 12/12/12:  hba1c 5  Assessment/Plan:   1. Schizoaffective disorder, bipolar type Not safe to go home due to her inability to perform self care with morbid  obesity, psychosis (thinks she is pregnant and already caring for another child), has been discharged two previous times and wound up back at the hospital and here due to stopping all of her medicines and becoming acutely ill and psychotic Needs long term care at least in assisted living level where meds are administered to her and she has some supervision.  2. Mild intellectual disabilities Not clear her exact diagnosis from this standpoint  3. Obesity, morbid -now unable to perform self-care due to generalized weakness and left lower extremity contracture in addition to her delusions, poor judgment and lack of insight

## 2013-02-15 ENCOUNTER — Encounter: Payer: Self-pay | Admitting: Internal Medicine

## 2013-02-21 ENCOUNTER — Encounter (HOSPITAL_COMMUNITY): Payer: Self-pay | Admitting: Emergency Medicine

## 2013-02-21 ENCOUNTER — Emergency Department (HOSPITAL_COMMUNITY): Payer: Medicare Other

## 2013-02-21 ENCOUNTER — Inpatient Hospital Stay (HOSPITAL_COMMUNITY)
Admission: EM | Admit: 2013-02-21 | Discharge: 2013-03-04 | DRG: 690 | Disposition: A | Discharge status: Skilled Nursing Facility | Payer: Medicare Other | Attending: Cardiology | Admitting: Cardiology

## 2013-02-21 DIAGNOSIS — L0292 Furuncle, unspecified: Secondary | ICD-10-CM | POA: Diagnosis present

## 2013-02-21 DIAGNOSIS — E876 Hypokalemia: Secondary | ICD-10-CM | POA: Diagnosis present

## 2013-02-21 DIAGNOSIS — K449 Diaphragmatic hernia without obstruction or gangrene: Secondary | ICD-10-CM | POA: Diagnosis present

## 2013-02-21 DIAGNOSIS — R4182 Altered mental status, unspecified: Secondary | ICD-10-CM

## 2013-02-21 DIAGNOSIS — K219 Gastro-esophageal reflux disease without esophagitis: Secondary | ICD-10-CM | POA: Diagnosis present

## 2013-02-21 DIAGNOSIS — G4733 Obstructive sleep apnea (adult) (pediatric): Secondary | ICD-10-CM | POA: Diagnosis present

## 2013-02-21 DIAGNOSIS — N390 Urinary tract infection, site not specified: Principal | ICD-10-CM | POA: Diagnosis present

## 2013-02-21 DIAGNOSIS — I252 Old myocardial infarction: Secondary | ICD-10-CM

## 2013-02-21 DIAGNOSIS — E039 Hypothyroidism, unspecified: Secondary | ICD-10-CM | POA: Diagnosis present

## 2013-02-21 DIAGNOSIS — F29 Unspecified psychosis not due to a substance or known physiological condition: Secondary | ICD-10-CM | POA: Diagnosis present

## 2013-02-21 DIAGNOSIS — I4892 Unspecified atrial flutter: Secondary | ICD-10-CM | POA: Diagnosis present

## 2013-02-21 DIAGNOSIS — I5032 Chronic diastolic (congestive) heart failure: Secondary | ICD-10-CM | POA: Diagnosis present

## 2013-02-21 DIAGNOSIS — F22 Delusional disorders: Secondary | ICD-10-CM | POA: Diagnosis present

## 2013-02-21 DIAGNOSIS — A498 Other bacterial infections of unspecified site: Secondary | ICD-10-CM | POA: Diagnosis present

## 2013-02-21 DIAGNOSIS — Z6841 Body Mass Index (BMI) 40.0 and over, adult: Secondary | ICD-10-CM

## 2013-02-21 DIAGNOSIS — E662 Morbid (severe) obesity with alveolar hypoventilation: Secondary | ICD-10-CM | POA: Diagnosis present

## 2013-02-21 DIAGNOSIS — F259 Schizoaffective disorder, unspecified: Secondary | ICD-10-CM | POA: Diagnosis present

## 2013-02-21 DIAGNOSIS — Z87440 Personal history of urinary (tract) infections: Secondary | ICD-10-CM

## 2013-02-21 DIAGNOSIS — I10 Essential (primary) hypertension: Secondary | ICD-10-CM | POA: Diagnosis present

## 2013-02-21 DIAGNOSIS — J449 Chronic obstructive pulmonary disease, unspecified: Secondary | ICD-10-CM | POA: Diagnosis present

## 2013-02-21 DIAGNOSIS — I509 Heart failure, unspecified: Secondary | ICD-10-CM | POA: Diagnosis present

## 2013-02-21 DIAGNOSIS — J4489 Other specified chronic obstructive pulmonary disease: Secondary | ICD-10-CM | POA: Diagnosis present

## 2013-02-21 DIAGNOSIS — Z87891 Personal history of nicotine dependence: Secondary | ICD-10-CM

## 2013-02-21 LAB — CBC WITH DIFFERENTIAL/PLATELET
Basophils Absolute: 0 10*3/uL (ref 0.0–0.1)
Basophils Relative: 0 % (ref 0–1)
Eosinophils Absolute: 0.1 10*3/uL (ref 0.0–0.7)
Eosinophils Relative: 1 % (ref 0–5)
HCT: 40 % (ref 36.0–46.0)
Hemoglobin: 13.2 g/dL (ref 12.0–15.0)
Lymphocytes Relative: 18 % (ref 12–46)
Lymphs Abs: 2 10*3/uL (ref 0.7–4.0)
MCH: 33 pg (ref 26.0–34.0)
MCHC: 33 g/dL (ref 30.0–36.0)
MCV: 100 fL (ref 78.0–100.0)
Monocytes Absolute: 0.8 10*3/uL (ref 0.1–1.0)
Monocytes Relative: 7 % (ref 3–12)
Neutro Abs: 8.6 10*3/uL — ABNORMAL HIGH (ref 1.7–7.7)
Neutrophils Relative %: 74 % (ref 43–77)
Platelets: 275 10*3/uL (ref 150–400)
RBC: 4 MIL/uL (ref 3.87–5.11)
RDW: 13.9 % (ref 11.5–15.5)
WBC: 11.6 10*3/uL — ABNORMAL HIGH (ref 4.0–10.5)

## 2013-02-21 LAB — COMPREHENSIVE METABOLIC PANEL
ALT: 11 U/L (ref 0–35)
AST: 22 U/L (ref 0–37)
Albumin: 3.2 g/dL — ABNORMAL LOW (ref 3.5–5.2)
Alkaline Phosphatase: 80 U/L (ref 39–117)
BUN: 9 mg/dL (ref 6–23)
CO2: 27 mEq/L (ref 19–32)
Calcium: 10.3 mg/dL (ref 8.4–10.5)
Chloride: 99 mEq/L (ref 96–112)
Creatinine, Ser: 0.59 mg/dL (ref 0.50–1.10)
GFR calc Af Amer: >90 mL/min (ref >90)
GFR calc non Af Amer: >90 mL/min (ref >90)
Glucose, Bld: 91 mg/dL (ref 70–99)
Potassium: 3.8 mEq/L (ref 3.5–5.1)
Sodium: 139 mEq/L (ref 135–145)
Total Bilirubin: 0.4 mg/dL (ref 0.3–1.2)
Total Protein: 7.7 g/dL (ref 6.0–8.3)

## 2013-02-21 LAB — URINALYSIS, ROUTINE W REFLEX MICROSCOPIC
Glucose, UA: NEGATIVE mg/dL
Hgb urine dipstick: NEGATIVE
Ketones, ur: 15 mg/dL — AB
Nitrite: POSITIVE — AB
Protein, ur: 30 mg/dL — AB
Specific Gravity, Urine: 1.029 (ref 1.005–1.030)
Urobilinogen, UA: 1 mg/dL (ref 0.0–1.0)
pH: 6 (ref 5.0–8.0)

## 2013-02-21 LAB — URINE MICROSCOPIC-ADD ON

## 2013-02-21 LAB — VALPROIC ACID LEVEL: Valproic Acid Lvl: 21.8 ug/mL — ABNORMAL LOW (ref 50.0–100.0)

## 2013-02-21 LAB — RAPID URINE DRUG SCREEN, HOSP PERFORMED
Amphetamines: NOT DETECTED
Barbiturates: NOT DETECTED
Benzodiazepines: NOT DETECTED
Cocaine: NOT DETECTED
Opiates: NOT DETECTED
Tetrahydrocannabinol: NOT DETECTED

## 2013-02-21 LAB — CK: Total CK: 137 U/L (ref 7–177)

## 2013-02-21 LAB — CG4 I-STAT (LACTIC ACID): Lactic Acid, Venous: 2.09 mmol/L (ref 0.5–2.2)

## 2013-02-21 MED ORDER — SODIUM CHLORIDE 0.9 % IV BOLUS (SEPSIS)
1000.0000 mL | Freq: Once | INTRAVENOUS | Status: AC
  Administered 2013-02-21: 1000 mL via INTRAVENOUS

## 2013-02-21 MED ORDER — ASPIRIN EC 81 MG PO TBEC: 81.0000 mg | DELAYED_RELEASE_TABLET | Freq: Every day | ORAL | Status: DC

## 2013-02-21 MED ORDER — PANTOPRAZOLE SODIUM 40 MG PO TBEC
40.0000 mg | DELAYED_RELEASE_TABLET | Freq: Every day | ORAL | Status: DC
  Administered 2013-02-22 – 2013-03-04 (×11): 40 mg via ORAL
  Filled 2013-02-21 (×12): qty 1

## 2013-02-21 MED ORDER — NITROGLYCERIN 0.4 MG SL SUBL: 0.4000 mg | SUBLINGUAL_TABLET | SUBLINGUAL | Status: DC | PRN

## 2013-02-21 MED ORDER — LEVOTHYROXINE SODIUM 150 MCG PO TABS
150.0000 ug | ORAL_TABLET | Freq: Every day | ORAL | Status: DC
  Administered 2013-02-22 – 2013-03-04 (×11): 150 ug via ORAL
  Filled 2013-02-21 (×13): qty 1

## 2013-02-21 MED ORDER — CIPROFLOXACIN IN D5W 400 MG/200ML IV SOLN
400.0000 mg | Freq: Two times a day (BID) | INTRAVENOUS | Status: DC
  Administered 2013-02-22 – 2013-02-23 (×3): 400 mg via INTRAVENOUS
  Filled 2013-02-21 (×3): qty 200

## 2013-02-21 MED ORDER — DIVALPROEX SODIUM ER 250 MG PO TB24
250.0000 mg | ORAL_TABLET | Freq: Every day | ORAL | Status: DC
  Administered 2013-02-22 – 2013-03-03 (×11): 250 mg via ORAL
  Filled 2013-02-21 (×13): qty 1

## 2013-02-21 MED ORDER — ACETAMINOPHEN 325 MG PO TABS
650.0000 mg | ORAL_TABLET | Freq: Once | ORAL | Status: AC
  Administered 2013-02-21: 650 mg via ORAL
  Filled 2013-02-21: qty 2

## 2013-02-21 MED ORDER — ALBUTEROL SULFATE HFA 108 (90 BASE) MCG/ACT IN AERS
2.0000 | INHALATION_SPRAY | Freq: Four times a day (QID) | RESPIRATORY_TRACT | Status: DC | PRN
  Filled 2013-02-21: qty 6.7

## 2013-02-21 MED ORDER — METOPROLOL SUCCINATE ER 50 MG PO TB24
50.0000 mg | ORAL_TABLET | Freq: Every day | ORAL | Status: DC
  Administered 2013-02-23: 50 mg via ORAL
  Filled 2013-02-21 (×3): qty 1

## 2013-02-21 MED ORDER — BUDESONIDE 0.25 MG/2ML IN SUSP
0.2500 mg | Freq: Two times a day (BID) | RESPIRATORY_TRACT | Status: DC
  Administered 2013-02-21 – 2013-03-04 (×18): 0.25 mg via RESPIRATORY_TRACT
  Filled 2013-02-21 (×27): qty 2

## 2013-02-21 MED ORDER — POLYETHYLENE GLYCOL 3350 17 G PO PACK
17.0000 g | PACK | ORAL | Status: DC
  Administered 2013-02-25: 17 g via ORAL
  Filled 2013-02-21 (×4): qty 1

## 2013-02-21 MED ORDER — SODIUM CHLORIDE 0.9 % IV SOLN
INTRAVENOUS | Status: DC
  Administered 2013-02-22 (×2): via INTRAVENOUS

## 2013-02-21 MED ORDER — PALIPERIDONE ER 6 MG PO TB24: 12.0000 mg | ORAL_TABLET | Freq: Once | ORAL | Status: DC

## 2013-02-21 MED ORDER — ASPIRIN EC 81 MG PO TBEC
81.0000 mg | DELAYED_RELEASE_TABLET | Freq: Every morning | ORAL | Status: DC
  Administered 2013-02-22 – 2013-03-04 (×11): 81 mg via ORAL
  Filled 2013-02-21 (×11): qty 1

## 2013-02-21 MED ORDER — CIPROFLOXACIN IN D5W 400 MG/200ML IV SOLN
400.0000 mg | Freq: Once | INTRAVENOUS | Status: AC
  Administered 2013-02-21: 400 mg via INTRAVENOUS
  Filled 2013-02-21: qty 200

## 2013-02-21 MED ORDER — HEPARIN SODIUM (PORCINE) 5000 UNIT/ML IJ SOLN
5000.0000 [IU] | Freq: Three times a day (TID) | INTRAMUSCULAR | Status: DC
  Administered 2013-02-22 – 2013-02-23 (×6): 5000 [IU] via SUBCUTANEOUS
  Filled 2013-02-21 (×8): qty 1

## 2013-02-21 MED ORDER — PALIPERIDONE ER 6 MG PO TB24
12.0000 mg | ORAL_TABLET | Freq: Every day | ORAL | Status: DC
  Filled 2013-02-21: qty 2

## 2013-02-21 NOTE — Progress Notes (Signed)
[facility] consulted with CSW concerning patient.  Pt was a resident at [facility] until a week ago.  CSW spoke with Archie Patten at [facility] who reported that patient signed herself out against medical advice last Friday.  She reports that pt has a significant psychiatric history and it is not unusual for her to be confused.  Archie Patten reported that pt sometimes can get very agitated and has previously "swung on" staff.    CSW met with pt at bedside with [facility].  Pts speech was very garbled and hard to understand. Pt knew was in the ED and that the year was 2014.   She did not know the month or the day.  When CSW asked pt why she was in the ED, she gave an answer that was garbled.  When asked who called the ambulance to bring her here, she gave another garbled response, but CSW and [facility] were able to understand, [name] did.  Pt reports that she is not married, and said that  [place] did not take care of her.  Pt became upset when asked if [name] could be called, and started screaming, "He ain't my power of attorney, I am my own power of attorney!"   Pt reports that she doesn't have a nurse visiting her anymore.  Pt talked more but most of it was garbled and could not be understood. CSW did not attempt to assess patient any further.  Pt asked for her remote control, and then proceeded to hold it up to her ear and hold a conversation and laughing as if she was talking to someone on the telephone.    Marva Panda, Theresia Majors  [phone] .02/21/2013

## 2013-02-21 NOTE — ED Notes (Signed)
Pt singing and speaking in incomprehensible words.

## 2013-02-21 NOTE — ED Notes (Signed)
Pt observed using the call bell as a telephone and talking into it.  Pt is laughing.

## 2013-02-21 NOTE — Progress Notes (Signed)
EDCM spoke to patient at bedside.  EDCM assessed orientation. Patient's sppech is garbled and difficult to understand. As per patient, today is Friday (today is thursday), did not know what month it is, and stated it was the year 2014.  [facility] awhosked patient, who called the ambulance to bring her here?  Patient answered, [name] did.  EDCM asked patient if [name] takes care of her at home?  Patient stated, "No, he left me in feces (patient used profanity word)"   EDCM asked patient if she could call [name], and patient stated, "He ain't my POA, I'm my own POA!!!!"  EDCM asked patient if the visiting RN still comes out to see her, patient replied, "They don't come out anymore because they can't stand him!!!"  [facility] placed text to [name] of [facility] to find out if patient is still active with their services.

## 2013-02-21 NOTE — ED Notes (Signed)
Per EMS: signed self out of [facility], laid on couch for about 1 week in feces and urine. Having abnormal behavior not her normal. Pt states she cant walk but pt really can.

## 2013-02-21 NOTE — H&P (Signed)
[name] is an 62 y.o. female.   Chief Complaint: Questionable acute mental status/UTI HPI: Patient is 62 year old female with past medical history significant for multiple medical problems i.e. hypertension, hypothyroidism, COPD, history of congestive heart failure secondary to diastolic dysfunction in the past, morbid obesity, obesity hypoventilation syndrome/obstructive sleep apnea, history of schizoaffective disorder, history of UTI in the past, and GERD, history of paroxysmal atrial flutter, history of tobacco abuse, history of questionable seizure disorder, height is hernia, [facility] signed out AMA approximately one week was brought to the ER by EMS because of inability to ambulate and low-grade fever and incontinence of feces and urine and was noted to have UTI.  Patient denies any abdominal pain or urinary complaints. Denies any chest pain or shortness of breath denies any cough but complains of fever and chills.   Past Medical History  Diagnosis Date  . Schizoaffective disorder   . Urinary tract infection   . Hypertension   . GERD (gastroesophageal reflux disease)   . Hypothyroidism   . Obesity   . Asthma   . COPD (chronic obstructive pulmonary disease)   . CHF (congestive heart failure)   . Seizures   . Morbid obesity   . Chronic pain   . Hypercholesteremia   . Anginal pain   . Myocardial infarction 2002  . Pneumonia     "several times" (05/23/2012)  . Diabetes mellitus     "I'm not diabetic anymore" (05/23/2012)  . Anemia   . History of blood transfusion 1993    "when I had my hysterectomy" (05/23/2012)  . H/O hiatal hernia   . Daily headache   . Arthritis     "severe; all over my body" (05/23/2012)  . Hypoventilation syndrome     [name] 05/30/2012  . Atrial fibrillation   . Atrial flutter, paroxysmal     [name] 05/30/2012  . Exertional dyspnea   . Shortness of breath     "all the time lately" (05/30/2012)    Past Surgical History  Procedure  Laterality Date  . Vaginal hysterectomy  1993  . Tubal ligation  1998  . Cholecystectomy  ?2008  . Cardiac catheterization  2008    Family History  Problem Relation Age of Onset  . Heart disease Father   . Cancer Mother 64    Breast   Social History:  reports that she quit smoking about 12 years ago. Her smoking use included Cigarettes. She has a 24 pack-year smoking history. Her smokeless tobacco use includes Snuff and Chew. She reports that she does not drink alcohol or use illicit drugs.  Allergies:  Allergies  Allergen Reactions  . Food Rash    "lasagna"= rash from the noodles  . Penicillins Hives  . Sulfamethoxazole W-Trimethoprim Hives and Nausea And Vomiting     (Not in a hospital admission)  Results for orders placed during the hospital encounter of 02/21/13 (from the past 48 hour(s))  CK     Status: None   Collection Time    02/21/13  7:45 PM      Result Value Range   Total CK 137  7 - 177 U/L  COMPREHENSIVE METABOLIC PANEL     Status: Abnormal   Collection Time    02/21/13  7:45 PM      Result Value Range   Sodium 139  135 - 145 mEq/L   Potassium 3.8  3.5 - 5.1 mEq/L   Comment: MODERATE HEMOLYSIS     HEMOLYSIS AT  THIS LEVEL MAY AFFECT RESULT   Chloride 99  96 - 112 mEq/L   CO2 27  19 - 32 mEq/L   Glucose, Bld 91  70 - 99 mg/dL   BUN 9  6 - 23 mg/dL   Creatinine, Ser 1.61  0.50 - 1.10 mg/dL   Calcium 09.6  8.4 - 04.5 mg/dL   Total Protein 7.7  6.0 - 8.3 g/dL   Albumin 3.2 (*) 3.5 - 5.2 g/dL   AST 22  0 - 37 U/L   ALT 11  0 - 35 U/L   Alkaline Phosphatase 80  39 - 117 U/L   Total Bilirubin 0.4  0.3 - 1.2 mg/dL   GFR calc non Af Amer >90  >90 mL/min   GFR calc Af Amer >90  >90 mL/min   Comment: (NOTE)     The eGFR has been calculated using the CKD EPI equation.     This calculation has not been validated in all clinical situations.     eGFR's persistently <90 mL/min signify possible Chronic Kidney     Disease.  CBC WITH DIFFERENTIAL     Status:  Abnormal   Collection Time    02/21/13  7:45 PM      Result Value Range   WBC 11.6 (*) 4.0 - 10.5 K/uL   RBC 4.00  3.87 - 5.11 MIL/uL   Hemoglobin 13.2  12.0 - 15.0 g/dL   HCT 40.9  81.1 - 91.4 %   MCV 100.0  78.0 - 100.0 fL   MCH 33.0  26.0 - 34.0 pg   MCHC 33.0  30.0 - 36.0 g/dL   RDW 78.2  95.6 - 21.3 %   Platelets 275  150 - 400 K/uL   Neutrophils Relative % 74  43 - 77 %   Neutro Abs 8.6 (*) 1.7 - 7.7 K/uL   Lymphocytes Relative 18  12 - 46 %   Lymphs Abs 2.0  0.7 - 4.0 K/uL   Monocytes Relative 7  3 - 12 %   Monocytes Absolute 0.8  0.1 - 1.0 K/uL   Eosinophils Relative 1  0 - 5 %   Eosinophils Absolute 0.1  0.0 - 0.7 K/uL   Basophils Relative 0  0 - 1 %   Basophils Absolute 0.0  0.0 - 0.1 K/uL  VALPROIC ACID LEVEL     Status: Abnormal   Collection Time    02/21/13  7:45 PM      Result Value Range   Valproic Acid Lvl 21.8 (*) 50.0 - 100.0 ug/mL   Comment: Performed at [facility]  CG4 I-STAT (LACTIC ACID)     Status: None   Collection Time    02/21/13  8:06 PM      Result Value Range   Lactic Acid, Venous 2.09  0.5 - 2.2 mmol/L  URINALYSIS, ROUTINE W REFLEX MICROSCOPIC     Status: Abnormal   Collection Time    02/21/13  8:20 PM      Result Value Range   Color, Urine AMBER (*) YELLOW   Comment: BIOCHEMICALS MAY BE AFFECTED BY COLOR   APPearance CLOUDY (*) CLEAR   Specific Gravity, Urine 1.029  1.005 - 1.030   pH 6.0  5.0 - 8.0   Glucose, UA NEGATIVE  NEGATIVE mg/dL   Hgb urine dipstick NEGATIVE  NEGATIVE   Bilirubin Urine SMALL (*) NEGATIVE   Ketones, ur 15 (*) NEGATIVE mg/dL   Protein, ur 30 (*) NEGATIVE mg/dL  Urobilinogen, UA 1.0  0.0 - 1.0 mg/dL   Nitrite POSITIVE (*) NEGATIVE   Leukocytes, UA LARGE (*) NEGATIVE  URINE RAPID DRUG SCREEN (HOSP PERFORMED)     Status: None   Collection Time    02/21/13  8:20 PM      Result Value Range   Opiates NONE DETECTED  NONE DETECTED   Cocaine NONE DETECTED  NONE DETECTED   Benzodiazepines NONE DETECTED   NONE DETECTED   Amphetamines NONE DETECTED  NONE DETECTED   Tetrahydrocannabinol NONE DETECTED  NONE DETECTED   Barbiturates NONE DETECTED  NONE DETECTED   Comment:            DRUG SCREEN FOR MEDICAL PURPOSES     ONLY.  IF CONFIRMATION IS NEEDED     FOR ANY PURPOSE, NOTIFY LAB     WITHIN 5 DAYS.                LOWEST DETECTABLE LIMITS     FOR URINE DRUG SCREEN     Drug Class       Cutoff (ng/mL)     Amphetamine      1000     Barbiturate      200     Benzodiazepine   200     Tricyclics       300     Opiates          300     Cocaine          300     THC              50  URINE MICROSCOPIC-ADD ON     Status: Abnormal   Collection Time    02/21/13  8:20 PM      Result Value Range   Squamous Epithelial / LPF RARE  RARE   WBC, UA TOO NUMEROUS TO COUNT  <3 WBC/hpf   Comment: WITH CLUMPS   Bacteria, UA MANY (*) RARE   Urine-Other MUCOUS PRESENT     Dg Chest Portable 1 View  02/21/2013   *RADIOLOGY REPORT*  Clinical Data: Altered mental status history of COPD and CHF  PORTABLE CHEST - 1 VIEW  Comparison: Prior chest x-ray 02/12/2013  Findings: Low inspiratory volumes with mild bibasilar atelectasis. Stable enlargement the cardiopericardial silhouette with the ventricular prominence.  Pulmonary vascular congestion is exaggerated by the low lung volumes.  No overt edema, focal consolidation, pneumothorax or pleural effusion.  No acute osseous abnormality.  IMPRESSION:  Low lung volumes with mild bibasilar atelectasis.  Otherwise, no acute cardiopulmonary disease.  Stable mild cardiomegaly with left jugular prominence.   Original Report Authenticated By: Malachy Moan, M.D.    Review of Systems  Constitutional: Positive for fever and chills.  Cardiovascular: Negative for chest pain and palpitations.  Gastrointestinal: Negative for nausea, vomiting and abdominal pain.  Genitourinary: Negative for dysuria, urgency and flank pain.  Neurological: Negative for headaches.    Blood pressure  113/73, pulse 116, temperature 100.8 F (38.2 C), temperature source Oral, resp. rate 16, SpO2 94.00%. Physical Exam  Eyes: Conjunctivae are normal. Pupils are equal, round, and reactive to light. Left eye exhibits no discharge. No scleral icterus.  Neck: Normal range of motion. Neck supple. No JVD present. No tracheal deviation present. No thyromegaly present.  Cardiovascular: Normal rate, regular rhythm and intact distal pulses.   No murmur heard. Respiratory: Effort normal and breath sounds normal. No respiratory distress. She has no wheezes. She has no rales.  GI:  Obese bowel sounds present nontender  Musculoskeletal: She exhibits no edema and no tenderness.  Neurological: She is alert.     Assessment/Plan Altered mental status probably secondary to UTI Schizoaffective disorder Hypertension Hypothyroidism COPD History of congestive heart failure secondary to diastolic dysfunction Morbid obesity Obesity hypoventilation syndrome/obstructive sleep apnea History of recurrent UTI in the past GERD History of paroxysmal atrial flutter in the past History of tobacco abuse History of questionable seizure disorder Hiatus hernia Plan As per orders   Albana Saperstein N 02/21/2013, 10:05 PM

## 2013-02-21 NOTE — Progress Notes (Signed)
ANTIBIOTIC CONSULT NOTE - INITIAL  Pharmacy Consult for Cipro Indication: UTI  Allergies  Allergen Reactions  . Food Rash    "lasagna"= rash from the noodles  . Penicillins Hives  . Sulfamethoxazole W-Trimethoprim Hives and Nausea And Vomiting    Patient Measurements:   Adjusted Body Weight:   Vital Signs: Temp: 98.9 F (37.2 C) (09/04 2325) Temp src: Oral (09/04 2325) BP: 152/119 mmHg (09/04 2325) Pulse Rate: 105 (09/04 2325) Intake/Output from previous day:   Intake/Output from this shift:    Labs:  Recent Labs  02/21/13 1945  WBC 11.6*  HGB 13.2  PLT 275  CREATININE 0.59   The CrCl is unknown because both a height and weight (above a minimum accepted value) are required for this calculation. No results found for this basename: VANCOTROUGH, VANCOPEAK, VANCORANDOM, GENTTROUGH, GENTPEAK, GENTRANDOM, TOBRATROUGH, TOBRAPEAK, TOBRARND, AMIKACINPEAK, AMIKACINTROU, AMIKACIN,  in the last 72 hours   Microbiology: No results found for this or any previous visit (from the past 720 hour(s)).  Medical History: Past Medical History  Diagnosis Date  . Schizoaffective disorder   . Urinary tract infection   . Hypertension   . GERD (gastroesophageal reflux disease)   . Hypothyroidism   . Obesity   . Asthma   . COPD (chronic obstructive pulmonary disease)   . CHF (congestive heart failure)   . Seizures   . Morbid obesity   . Chronic pain   . Hypercholesteremia   . Anginal pain   . Myocardial infarction 2002  . Pneumonia     "several times" (05/23/2012)  . Diabetes mellitus     "I'm not diabetic anymore" (05/23/2012)  . Anemia   . History of blood transfusion 1993    "when I had my hysterectomy" (05/23/2012)  . H/O hiatal hernia   . Daily headache   . Arthritis     "severe; all over my body" (05/23/2012)  . Hypoventilation syndrome     [name] 05/30/2012  . Atrial fibrillation   . Atrial flutter, paroxysmal     [name] 05/30/2012  . Exertional dyspnea   .  Shortness of breath     "all the time lately" (05/30/2012)    Medications:  Anti-infectives   Start     Dose/Rate Route Frequency Ordered Stop   02/22/13 1000  ciprofloxacin (CIPRO) IVPB 400 mg     400 mg 200 mL/hr over 60 Minutes Intravenous Every 12 hours 02/21/13 2332     02/21/13 2115  ciprofloxacin (CIPRO) IVPB 400 mg     400 mg 200 mL/hr over 60 Minutes Intravenous  Once 02/21/13 2101 02/21/13 2236     Assessment: Patient with UTI.  First dose of antibiotics already given.  Goal of Therapy:  Ciprofloxacin dosed based on patient weight and renal function   Plan:  Follow up culture results Ciprofloxacin 400mg  iv q12hr  Aleene Davidson Crowford 02/21/2013,11:37 PM

## 2013-02-21 NOTE — ED Provider Notes (Signed)
CSN: [id number]     Arrival date & time 02/21/13  1846 History   First MD Initiated Contact with Patient 02/21/13 1900     Chief Complaint  Patient presents with  . Behavior Problem   (Consider location/radiation/quality/duration/timing/severity/associated sxs/prior Treatment) HPI  Hx of EMS/prior records. Pt yelling mostly unintelligibly and no family present. Only thing I can understand is "I'm paralyyyyyyyyyyyyyyyzed!" Unclear if mental status related to underlying psychiatric disorder or acutely altered. Apparently checked self out from [facility] about a week ago. Has been laying on couch since and refusing to move. Was staying with son and could not get her up so finally called EMS. Found laying in feces. No reported trauma in ingestion. Reportedly has been refusing to walk although by report can normally ambulate.   Past Medical History  Diagnosis Date  . Schizoaffective disorder   . Urinary tract infection   . Hypertension   . GERD (gastroesophageal reflux disease)   . Hypothyroidism   . Obesity   . Asthma   . COPD (chronic obstructive pulmonary disease)   . CHF (congestive heart failure)   . Seizures   . Morbid obesity   . Chronic pain   . Hypercholesteremia   . Anginal pain   . Myocardial infarction 2002  . Pneumonia     "several times" (05/23/2012)  . Diabetes mellitus     "I'm not diabetic anymore" (05/23/2012)  . Anemia   . History of blood transfusion 1993    "when I had my hysterectomy" (05/23/2012)  . H/O hiatal hernia   . Daily headache   . Arthritis     "severe; all over my body" (05/23/2012)  . Hypoventilation syndrome     [name] 05/30/2012  . Atrial fibrillation   . Atrial flutter, paroxysmal     [name] 05/30/2012  . Exertional dyspnea   . Shortness of breath     "all the time lately" (05/30/2012)   Past Surgical History  Procedure Laterality Date  . Vaginal hysterectomy  1993  . Tubal ligation  1998  . Cholecystectomy  ?2008  . Cardiac  catheterization  2008   Family History  Problem Relation Age of Onset  . Heart disease Father   . Cancer Mother 16    Breast   History  Substance Use Topics  . Smoking status: Former Smoker -- 2.00 packs/day for 12 years    Types: Cigarettes    Quit date: 06/29/2000  . Smokeless tobacco: Current User    Types: Snuff, Chew     Comment: 05/30/2012 "still have what you gave me last time"  . Alcohol Use: No   OB History   Grav Para Term Preterm Abortions TAB SAB Ect Mult Living                 Review of Systems  All systems reviewed and negative, other than as noted in HPI.   Allergies  Food; Penicillins; and Sulfamethoxazole w-trimethoprim  Home Medications   Current Outpatient Rx  Name  Route  Sig  Dispense  Refill  . albuterol (PROVENTIL HFA;VENTOLIN HFA) 108 (90 BASE) MCG/ACT inhaler   Inhalation   Inhale 2 puffs into the lungs every 6 (six) hours as needed for wheezing.         Marland Kitchen aspirin EC 81 MG tablet   Oral   Take 81 mg by mouth every morning.          . budesonide (PULMICORT) 0.25 MG/2ML nebulizer solution   Nebulization  Take 0.25 mg by nebulization 2 (two) times daily.         . divalproex (DEPAKOTE ER) 250 MG 24 hr tablet   Oral   Take 250 mg by mouth at bedtime.         . fluticasone (FLONASE) 50 MCG/ACT nasal spray   Nasal   Place 2 sprays into the nose daily. For nasal congestion         . levothyroxine (SYNTHROID, LEVOTHROID) 150 MCG tablet   Oral   Take 150 mcg by mouth every morning. For thyroid disease.         Marland Kitchen LORazepam (ATIVAN) 0.5 MG tablet   Oral   Take 0.5 mg by mouth every 12 (twelve) hours as needed for anxiety.         . metoprolol succinate (TOPROL-XL) 50 MG 24 hr tablet   Oral   Take 50 mg by mouth daily. Take with or immediately following a meal.         . nitroGLYCERIN (NITROSTAT) 0.4 MG SL tablet   Sublingual   Place 0.4 mg under the tongue every 5 (five) minutes as needed for chest pain.         Marland Kitchen  omeprazole (PRILOSEC) 20 MG capsule   Oral   Take 20 mg by mouth every morning.          . paliperidone ([name]) 3 MG 24 hr tablet   Oral   Take 12 mg by mouth daily.          . polyethylene glycol (MIRALAX / GLYCOLAX) packet   Oral   Take 17 g by mouth 2 (two) times a week. Tuesday and Friday         . PRESCRIPTION MEDICATION   Topical   Apply 1 application topically every 12 (twelve) hours as needed (anxiety). Lorazepam Gel 0.5 mg         . traMADol (ULTRAM) 50 MG tablet   Oral   Take 1 tablet (50 mg total) by mouth 4 (four) times daily.   120 tablet   5    BP 113/73  Pulse 116  Temp(Src) 100.8 F (38.2 C) (Oral)  Resp 16  SpO2 94% Physical Exam  Nursing note and vitals reviewed. Constitutional: She appears distressed.  Laying in bed. Yelling inappropriately.   HENT:  Head: Normocephalic and atraumatic.  Eyes: Conjunctivae are normal. Pupils are equal, round, and reactive to light. Right eye exhibits no discharge. Left eye exhibits no discharge.  Neck: Neck supple.  Cardiovascular: Regular rhythm and normal heart sounds.  Exam reveals no gallop and no friction rub.   No murmur heard. tachcyardic  Pulmonary/Chest: Effort normal.  Diminished b/l  Abdominal: Soft. She exhibits no distension. There is no tenderness.  Musculoskeletal: She exhibits no edema and no tenderness.  Neurological: She is alert.  Would not move extremities on command. When rolled to clean her though she reached for and grasped the bedrail and also pushing me away at times when examining her.  Withdraws both LE to pain. Could not fully assess CN but no facial droop. EOM appear to be intact.   Skin: She is not diaphoretic.  Back, perineum, lower abdomen, proximal legs covered in feces. Once cleaned up noted multiple areas of erythema and some superficial skin breakdown/maceration including lower back, buttocks, proximal thighs, perineum and anterior neck.   Psychiatric:  Yelling loudly and  most not understandable.     ED Course  Procedures (including critical care time)  Labs Review Labs Reviewed  URINALYSIS, ROUTINE W REFLEX MICROSCOPIC - Abnormal; Notable for the following:    Color, Urine AMBER (*)    APPearance CLOUDY (*)    Bilirubin Urine SMALL (*)    Ketones, ur 15 (*)    Protein, ur 30 (*)    Nitrite POSITIVE (*)    Leukocytes, UA LARGE (*)    All other components within normal limits  COMPREHENSIVE METABOLIC PANEL - Abnormal; Notable for the following:    Albumin 3.2 (*)    All other components within normal limits  CBC WITH DIFFERENTIAL - Abnormal; Notable for the following:    WBC 11.6 (*)    Neutro Abs 8.6 (*)    All other components within normal limits  VALPROIC ACID LEVEL - Abnormal; Notable for the following:    Valproic Acid Lvl 21.8 (*)    All other components within normal limits  URINE MICROSCOPIC-ADD ON - Abnormal; Notable for the following:    Bacteria, UA MANY (*)    All other components within normal limits  CULTURE, BLOOD (ROUTINE X 2)  CULTURE, BLOOD (ROUTINE X 2)  URINE CULTURE  URINE CULTURE  MRSA PCR SCREENING  CK  URINE RAPID DRUG SCREEN (HOSP PERFORMED)  CBC  BASIC METABOLIC PANEL  CG4 I-STAT (LACTIC ACID)   Imaging Review Dg Chest Portable 1 View  02/21/2013   *RADIOLOGY REPORT*  Clinical Data: Altered mental status history of COPD and CHF  PORTABLE CHEST - 1 VIEW  Comparison: Prior chest x-ray 02/12/2013  Findings: Low inspiratory volumes with mild bibasilar atelectasis. Stable enlargement the cardiopericardial silhouette with the ventricular prominence.  Pulmonary vascular congestion is exaggerated by the low lung volumes.  No overt edema, focal consolidation, pneumothorax or pleural effusion.  No acute osseous abnormality.  IMPRESSION:  Low lung volumes with mild bibasilar atelectasis.  Otherwise, no acute cardiopulmonary disease.  Stable mild cardiomegaly with left jugular prominence.   Original Report Authenticated By: Malachy Moan, M.D.    MDM   1. UTI (urinary tract infection)   2. Altered mental state      61yF brought in by EMS after reportedly laying on couch for week. Areas of skin breakdown. Possibly from pressure versus irritation from being wet/covered in excrement. Will check CK/renal function. Febrile. Will eval for infectious source, suspect UTI with essentially sitting in own waste for past week.  Antipyretics. Normotensive and will defer from empiric abx at this time. Mild tachycardia, likely related to fever and possibly dehydration. IVF. No hx to suggest trauma or ingestion. Despite saying she is paralyzed she is moving extremities although assessment difficult with her not being cooperative. Anticipate admit.   7:33 PM Discussed with [name], medical POA, who has had relationship with pt for past 17 years. Very pleasant but poor historian as well.  Reports that has been at [facility] for past 5-6 months. Came back and has been staying with him for the past week. He is not sure why she was discharged. Has been "talking out of her head" ever since she left. At baseline she doesn't always make sense but her current behavior has been worse. Has not been taking her medications. "She has it in her head that she is paralyzed" and he has not seen her ambulate recently. Even at [facility] it does not appear she did regularly.   9:01 PM UA consitent with UTI. Cipro with allergies. Admit.  Raeford Razor, MD 02/22/13 [phone]

## 2013-02-21 NOTE — ED Notes (Signed)
Bed: ZO10 Expected date:  Expected time:  Means of arrival:  Comments: ems- elderly, abnormal behavior

## 2013-02-22 LAB — BASIC METABOLIC PANEL
BUN: 8 mg/dL (ref 6–23)
CO2: 26 mEq/L (ref 19–32)
Chloride: 101 mEq/L (ref 96–112)
Creatinine, Ser: 0.54 mg/dL (ref 0.50–1.10)
GFR calc Af Amer: >90 mL/min (ref >90)
Glucose, Bld: 93 mg/dL (ref 70–99)
Potassium: 3.3 mEq/L — ABNORMAL LOW (ref 3.5–5.1)

## 2013-02-22 LAB — CBC
HCT: 39.8 % (ref 36.0–46.0)
Hemoglobin: 12.7 g/dL (ref 12.0–15.0)
MCV: 101 fL — ABNORMAL HIGH (ref 78.0–100.0)
RBC: 3.94 MIL/uL (ref 3.87–5.11)
RDW: 14.1 % (ref 11.5–15.5)
WBC: 11.4 10*3/uL — ABNORMAL HIGH (ref 4.0–10.5)

## 2013-02-22 MED ORDER — CHLORHEXIDINE GLUCONATE CLOTH 2 % EX PADS
6.0000 | MEDICATED_PAD | Freq: Every day | CUTANEOUS | Status: AC
  Administered 2013-02-22 – 2013-02-25 (×4): 6 via TOPICAL

## 2013-02-22 MED ORDER — HALOPERIDOL LACTATE 5 MG/ML IJ SOLN
1.0000 mg | Freq: Three times a day (TID) | INTRAMUSCULAR | Status: DC | PRN
  Administered 2013-02-22 – 2013-02-27 (×9): 1 mg via INTRAVENOUS
  Filled 2013-02-22 (×10): qty 1

## 2013-02-22 MED ORDER — MUPIROCIN 2 % EX OINT
1.0000 "application " | TOPICAL_OINTMENT | Freq: Two times a day (BID) | CUTANEOUS | Status: AC
  Administered 2013-02-22 – 2013-02-26 (×8): 1 via NASAL
  Filled 2013-02-22 (×2): qty 22

## 2013-02-22 MED ORDER — HYDROCORTISONE 1 % EX CREA
TOPICAL_CREAM | Freq: Two times a day (BID) | CUTANEOUS | Status: DC
  Administered 2013-02-22 – 2013-02-24 (×4): via TOPICAL
  Administered 2013-02-24: 1 via TOPICAL
  Administered 2013-02-25 – 2013-02-27 (×6): via TOPICAL
  Administered 2013-02-28: 1 via TOPICAL
  Administered 2013-02-28 – 2013-03-04 (×7): via TOPICAL
  Filled 2013-02-22: qty 28

## 2013-02-22 MED ORDER — POTASSIUM CHLORIDE ER 10 MEQ PO TBCR
40.0000 meq | EXTENDED_RELEASE_TABLET | Freq: Once | ORAL | Status: AC
  Administered 2013-02-22: 17:00:00 40 meq via ORAL
  Filled 2013-02-22: qty 4

## 2013-02-22 MED ORDER — PALIPERIDONE ER 6 MG PO TB24
12.0000 mg | ORAL_TABLET | Freq: Once | ORAL | Status: AC
  Administered 2013-02-22: 12 mg via ORAL
  Filled 2013-02-22: qty 2

## 2013-02-22 MED ORDER — PALIPERIDONE ER 3 MG PO TB24
3.0000 mg | ORAL_TABLET | Freq: Every day | ORAL | Status: DC
  Administered 2013-02-22 – 2013-03-01 (×8): 3 mg via ORAL
  Filled 2013-02-22 (×10): qty 1

## 2013-02-22 MED ORDER — HALOPERIDOL LACTATE 2 MG/ML PO CONC
1.0000 mg | Freq: Three times a day (TID) | ORAL | Status: DC | PRN
  Filled 2013-02-22: qty 0.5

## 2013-02-22 NOTE — Progress Notes (Signed)
Notified MD for some PRN meds, on admission patient was very combative, yelling and screaming.  When asked questions she would only respond with curse words.  Dr. Sharyn Lull called me back and ordered a one time dose of [name].  After giving to patient she soon calmed down and is now resting.  Patient refused to allow me to start her IV fluids, refused to allow me to clean her up, as well as threw cups at me.  Will continue to monitor and provide care as best as possible.    Ernesta Amble, RN
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
## 2013-02-22 NOTE — Progress Notes (Signed)
Spoke with Dr. Sharyn Lull, received orders, will continue to monitor patient.
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
## 2013-02-22 NOTE — Progress Notes (Signed)
PT Cancellation Note  Patient Details Name: CHRISSI CROW MRN: [id number] DOB: 05-01-1951   Cancelled Treatment:    Reason Eval/Treat Not Completed: pt stated" You know I can't walk. Im paralyzed. I don't want to sit up. " Pt refused to participate. Pt is also agitated with nursing. Not appropriate for PT at this time. Will check back another day.    Rebeca Alert, MPT Pager: [phone]

## 2013-02-22 NOTE — Progress Notes (Signed)
The patient continues to scream and yell profanities.  Patient asked for tobacco products.  The patient is hallucinating and delusional.  Pharmacy notified the nurse to see if the POA or any family had come to visit the patient. The patient has had no visitors.  Will continue to monitor the patient.

## 2013-02-22 NOTE — Care Management Note (Addendum)
    Page 1 of 1   02/25/2013     1:29:24 PM   CARE MANAGEMENT NOTE 02/25/2013  Patient:  JARIA, CONWAY   Account Number:  [id number]  Date Initiated:  02/22/2013  Documentation initiated by:  Lanier Clam  Subjective/Objective Assessment:   62 Y/O F ADMITTED W/UTI.ZO:[id number],JYNWGN WOUNDS.     Action/Plan:   FROM HOME.RECENT STAY [facility]   Anticipated DC Date:  02/26/2013   Anticipated [facility] Plan[facility]      DC Planning Services  CM consult      Choice offered to / List presented to:             Status of service:  In process, will continue to follow Medicare Important Message given?   (If response is "NO", the following Medicare IM given date fields will be blank) Date Medicare IM given:   Date Additional Medicare IM given:    Discharge Disposition:    Per UR Regulation:  Reviewed for med. necessity/level of care/duration of stay  If discussed at Long Length of Stay Meetings, dates discussed:    Comments:  02/25/13 Aubrina Nieman RN,BSN NCM [phone] AWAIT PSYCH EVAL.SITTER.UTI-IV ABX,HALLUCINATIONS-IV HALDOL.PSYCH SW FOLLOWING.  02/22/13 Jadis Mika RN,BSN NCM [phone] PSYCH CONS.RESTRAINTS.1:1.PER [facility],THEY RECEIVED A REFERRAL FOR HHC.

## 2013-02-22 NOTE — Consult Note (Signed)
WOC consult Note Reason for Consult: Abrasion to sacrum, scattered abrasions and bruising to buttocks.  Boil to left inner thigh. Wound type: Scabbed area to sacrum.  Boil to left inner thigh with periwound erythema  Measurement: 1 cm x 1 cm x 0.1 cm scabbed abrasion to sacrum.  Boil to left inner thigh that is erythematous and indurated.  Increased temperature to periwound.  Wound bed: scabbed to sacrum.   Drainage (amount, consistency, odor) none Periwound: sacrum periwound reddened and some bruising to buttock.  Boil to left wound has reddened, warm indurated periwound.  Dressing procedure/placement/frequency: sacrum open to air.  Topical care will not be effective for boil. If further POC is desired, consult CCS for I and D. Will not follow at this time.  Please re-consult if needed.  Maple Hudson RN BSN CWON Pager [phone]

## 2013-02-22 NOTE — Progress Notes (Signed)
OT Cancellation Note  Patient Details Name: [name] MRN: [id number] DOB: 12-12-1950   Cancelled Treatment:    Reason Eval/Treat Not Completed: Other (comment) (pt screaming and agitated with nursing.Will recheck next day)  Sianni Cloninger, Metro Kung 02/22/2013, 11:32 AM

## 2013-02-22 NOTE — Progress Notes (Signed)
Subjective:  Drowsy but more appropriate now complaining of generalized pain  Objective:  Vital Signs in the last 24 hours: Temp:  [98.8 F (37.1 C)-100.8 F (38.2 C)] 98.8 F (37.1 C) (09/05 0645) Pulse Rate:  [103-116] 103 (09/05 0645) Resp:  [16-20] 20 (09/05 0645) BP: (113-154)/(73-119) 119/74 mmHg (09/05 0645) SpO2:  [94 %-100 %] 100 % (09/05 0645) Weight:  [121.337 kg (267 lb 8 oz)] 121.337 kg (267 lb 8 oz) (09/05 0155)  Intake/Output from previous day: 09/04 0701 - 09/05 0700 In: -  Out: 300 [Urine:300] Intake/Output from this shift: Total I/O In: 200 [IV Piggyback:200] Out: -   Physical Exam: Neck: no adenopathy, no carotid bruit, no JVD and supple, symmetrical, trachea midline Lungs: Clear anteriorly Heart: regular rate and rhythm, S1, S2 normal, no murmur, click, rub or gallop Abdomen: soft, non-tender; bowel sounds normal; no masses,  no organomegaly Extremities: extremities normal, atraumatic, no cyanosis or edema  Lab Results:  Recent Labs  02/21/13 1945 02/22/13 0450  WBC 11.6* 11.4*  HGB 13.2 12.7  PLT 275 282    Recent Labs  02/21/13 1945 02/22/13 0450  NA 139 137  K 3.8 3.3*  CL 99 101  CO2 27 26  GLUCOSE 91 93  BUN 9 8  CREATININE 0.59 0.54   No results found for this basename: TROPONINI, CK, MB,  in the last 72 hours Hepatic Function Panel  Recent Labs  02/21/13 1945  PROT 7.7  ALBUMIN 3.2*  AST 22  ALT 11  ALKPHOS 80  BILITOT 0.4   No results found for this basename: CHOL,  in the last 72 hours No results found for this basename: PROTIME,  in the last 72 hours  Imaging: Imaging results have been reviewed and Dg Chest Portable 1 View  02/21/2013   *RADIOLOGY REPORT*  Clinical Data: Altered mental status history of COPD and CHF  PORTABLE CHEST - 1 VIEW  Comparison: Prior chest x-ray 02/12/2013  Findings: Low inspiratory volumes with mild bibasilar atelectasis. Stable enlargement the cardiopericardial silhouette with the  ventricular prominence.  Pulmonary vascular congestion is exaggerated by the low lung volumes.  No overt edema, focal consolidation, pneumothorax or pleural effusion.  No acute osseous abnormality.  IMPRESSION:  Low lung volumes with mild bibasilar atelectasis.  Otherwise, no acute cardiopulmonary disease.  Stable mild cardiomegaly with left jugular prominence.   Original Report Authenticated By: Malachy Moan, M.D.    Cardiac Studies:  Assessment/Plan:  Altered mental status probably secondary to UTI  Schizoaffective disorder  Hypertension  Hypothyroidism  COPD  History of congestive heart failure secondary to diastolic dysfunction  Morbid obesity  Obesity hypoventilation syndrome/obstructive sleep apnea  History of recurrent UTI in the past  GERD  History of paroxysmal atrial flutter in the past  History of tobacco abuse  History of questionable seizure disorder  Hiatus hernia  Hypokalemia Plan As per orders Replace K. Check labs in a.m. Out of bed as tolerated   LOS: 1 day    Keath Matera N 02/22/2013, 5:06 PM

## 2013-02-23 LAB — URINE CULTURE: Colony Count: >=100000

## 2013-02-23 LAB — BASIC METABOLIC PANEL
GFR calc Af Amer: >90 mL/min (ref >90)
GFR calc non Af Amer: >90 mL/min (ref >90)
Potassium: 3.9 mEq/L (ref 3.5–5.1)
Sodium: 136 mEq/L (ref 135–145)

## 2013-02-23 LAB — CBC
MCHC: 32.4 g/dL (ref 30.0–36.0)
RDW: 14.1 % (ref 11.5–15.5)

## 2013-02-23 IMAGING — CR DG CHEST 2V
2 series · 2 of 2 positions shown · non-contrast
Comparison: 10/09/2011

CLINICAL DATA: Chest pain

CHEST - 2 VIEW

[w chest lat]
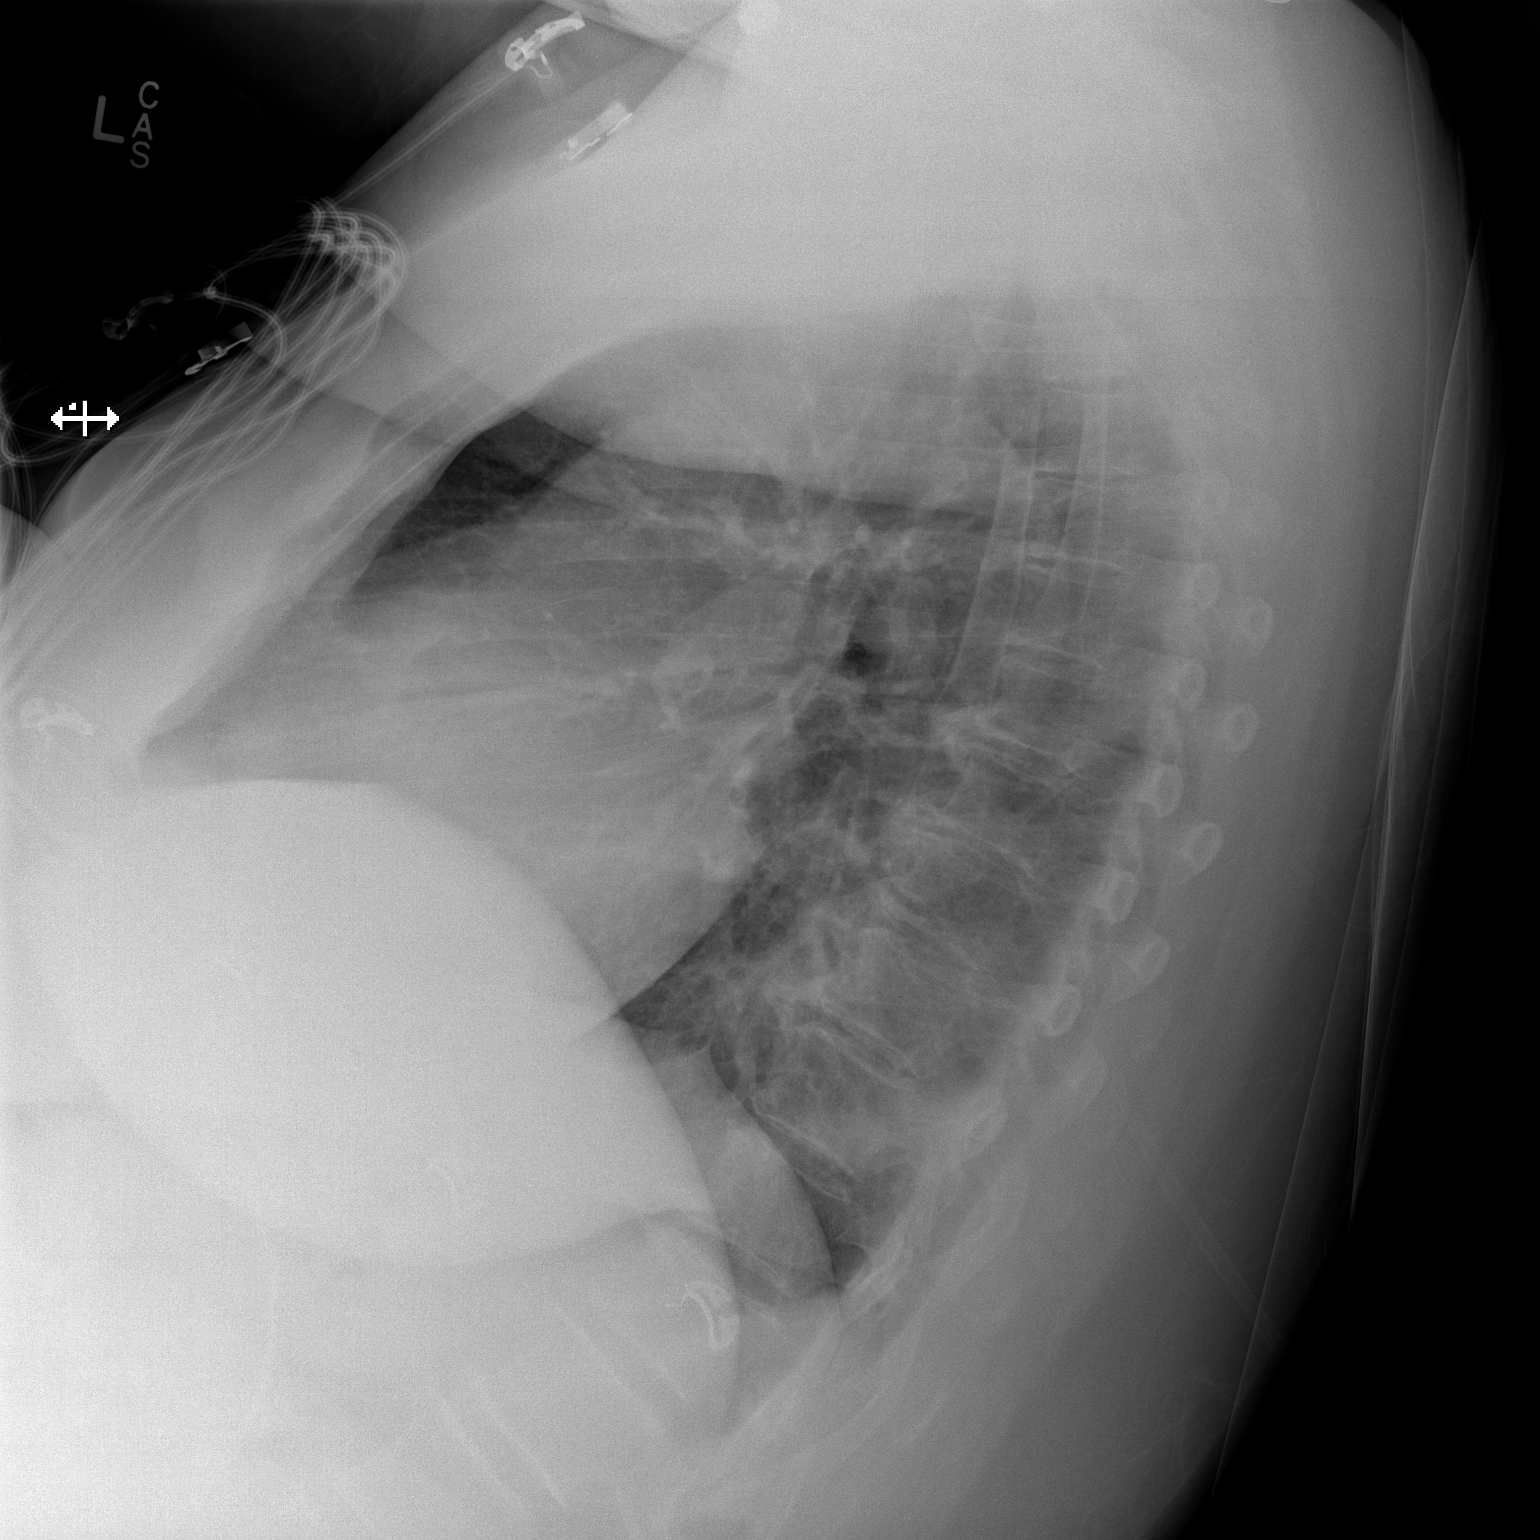

[x chest ap]
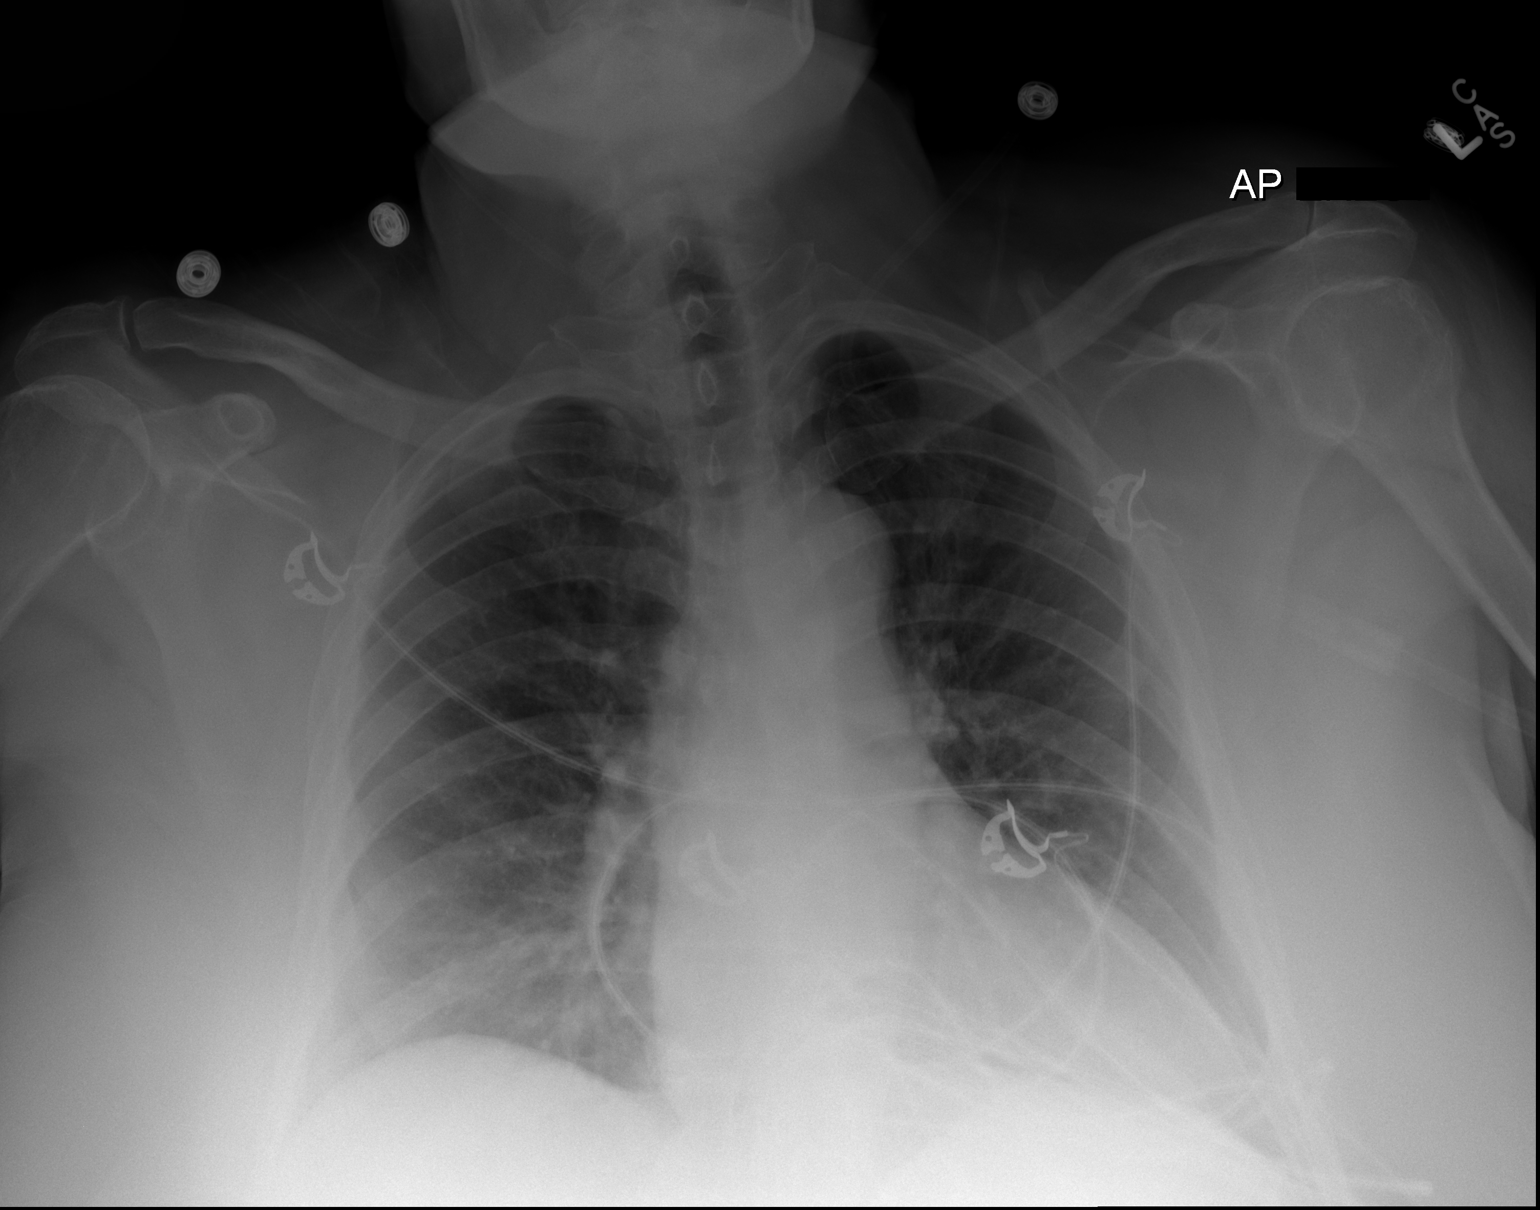

[2 of 2 positions shown; findings below may reference images not displayed]

FINDINGS: Low volume film. The lungs are clear without focal
infiltrate, edema, pneumothorax or pleural effusion.
Cardiopericardial silhouette is at upper limits of normal for size.
Imaged bony structures of the thorax are intact. Telemetry leads
overlie the chest.
IMPRESSION: Low volume film with borderline cardiomegaly.  No acute
cardiopulmonary process.

## 2013-02-23 MED ORDER — SODIUM CHLORIDE 0.9 % IV SOLN
500.0000 mg | Freq: Four times a day (QID) | INTRAVENOUS | Status: DC
  Administered 2013-02-23 – 2013-02-24 (×4): 500 mg via INTRAVENOUS
  Filled 2013-02-23 (×5): qty 500

## 2013-02-23 MED ORDER — ENOXAPARIN SODIUM 60 MG/0.6ML ~~LOC~~ SOLN
0.5000 mg/kg | SUBCUTANEOUS | Status: DC
  Administered 2013-02-23 – 2013-03-03 (×8): 60 mg via SUBCUTANEOUS
  Filled 2013-02-23 (×10): qty 0.6

## 2013-02-23 MED ORDER — TRAMADOL HCL 50 MG PO TABS
50.0000 mg | ORAL_TABLET | Freq: Three times a day (TID) | ORAL | Status: DC | PRN
  Administered 2013-02-23 – 2013-03-03 (×6): 50 mg via ORAL
  Filled 2013-02-23 (×8): qty 1

## 2013-02-23 MED ORDER — METOPROLOL TARTRATE 50 MG PO TABS
50.0000 mg | ORAL_TABLET | Freq: Two times a day (BID) | ORAL | Status: DC
  Administered 2013-02-23 – 2013-03-04 (×18): 50 mg via ORAL
  Filled 2013-02-23 (×20): qty 1

## 2013-02-23 MED ORDER — SODIUM CHLORIDE 0.9 % IV SOLN
INTRAVENOUS | Status: DC
  Administered 2013-02-23 – 2013-02-26 (×5): via INTRAVENOUS

## 2013-02-23 NOTE — Progress Notes (Signed)
PT Cancellation Note  Patient Details Name: [name] MRN: [id number] DOB: 1950/08/11   Cancelled Treatment:    Reason Eval/Treat Not Completed: Other (comment) (remains combative)will check on Monday.   Rada Hay 02/23/2013, 1:37 PM Blanchard Kelch PT [phone]

## 2013-02-23 NOTE — Progress Notes (Signed)
Subjective:  Patient denies any chest pain no shortness of breath complains of leg pain and inability to walk. Patient periods of agitation and confusion  Objective:  Vital Signs in the last 24 hours: Temp:  [97.9 F (36.6 C)-98.3 F (36.8 C)] 98.3 F (36.8 C) (09/06 1011) Pulse Rate:  [117-125] 124 (09/06 1011) Resp:  [20] 20 (09/06 0614) BP: (92-139)/(37-87) 139/67 mmHg (09/06 1011) SpO2:  [95 %-97 %] 97 % (09/06 0614)  Intake/Output from previous day: 09/05 0701 - 09/06 0700 In: 2325 [P.O.:720; I.V.:1205; IV Piggyback:400] Out: 400 [Urine:400] Intake/Output from this shift:    Physical Exam: Neck: no adenopathy, no carotid bruit, no JVD and supple, symmetrical, trachea midline Lungs: clear to auscultation bilaterally Heart: Tachycardic S1 and S2 soft  Abdomen: soft, non-tender; bowel sounds normal; no masses,  no organomegaly Extremities: extremities normal, atraumatic, no cyanosis or edema  Lab Results:  Recent Labs  02/22/13 0450 02/23/13 0552  WBC 11.4* 10.3  HGB 12.7 12.5  PLT 282 264    Recent Labs  02/22/13 0450 02/23/13 0552  NA 137 136  K 3.3* 3.9  CL 101 102  CO2 26 25  GLUCOSE 93 106*  BUN 8 7  CREATININE 0.54 0.64   No results found for this basename: TROPONINI, CK, MB,  in the last 72 hours Hepatic Function Panel  Recent Labs  02/21/13 1945  PROT 7.7  ALBUMIN 3.2*  AST 22  ALT 11  ALKPHOS 80  BILITOT 0.4   No results found for this basename: CHOL,  in the last 72 hours No results found for this basename: PROTIME,  in the last 72 hours  Imaging: Imaging results have been reviewed and Dg Chest Portable 1 View  02/21/2013   *RADIOLOGY REPORT*  Clinical Data: Altered mental status history of COPD and CHF  PORTABLE CHEST - 1 VIEW  Comparison: Prior chest x-ray 02/12/2013  Findings: Low inspiratory volumes with mild bibasilar atelectasis. Stable enlargement the cardiopericardial silhouette with the ventricular prominence.  Pulmonary  vascular congestion is exaggerated by the low lung volumes.  No overt edema, focal consolidation, pneumothorax or pleural effusion.  No acute osseous abnormality.  IMPRESSION:  Low lung volumes with mild bibasilar atelectasis.  Otherwise, no acute cardiopulmonary disease.  Stable mild cardiomegaly with left jugular prominence.   Original Report Authenticated By: Malachy Moan, M.D.    Cardiac Studies:  Assessment/Plan:  Escherichia coli UTI Schizoaffective disorder  Hypertension  Hypothyroidism  COPD  History of congestive heart failure secondary to diastolic dysfunction  Morbid obesity  Obesity hypoventilation syndrome/obstructive sleep apnea  History of recurrent UTI in the past  GERD  Atrial flutter with moderate ventricular response History of tobacco abuse  History of questionable seizure disorder  Hiatus hernia Plan Increase Lopressor as per orders Check EKG Psych consult Social service for discharge planning Out of bed to chair as tolerated Check culture and sensitivity  LOS: 2 days    Azir Muzyka N 02/23/2013, 10:31 AM

## 2013-02-23 NOTE — Progress Notes (Signed)
ANTICOAGULATION CONSULT NOTE  Pharmacy Consult for Lovenox Indication: VTE prophylaxis   Patient Measurements: Height: 5' (152.4 cm) Weight: 267 lb 8 oz (121.337 kg) IBW/kg (Calculated) : 45.5  Labs:  Recent Labs  02/21/13 1945 02/22/13 0450 02/23/13 0552  HGB 13.2 12.7 12.5  HCT 40.0 39.8 38.6  PLT 275 282 264  CREATININE 0.59 0.54 0.64  CKTOTAL 137  --   --     Estimated Creatinine Clearance: 90.6 ml/min (by C-G formula based on Cr of 0.64).   Assessment:  87 yoF admitted with UTI.  Pharmacy requested to dose Lovenox for VTE prophylaxis.    Patient has been receiving SQ heparin thus far.  Last 5000 units SQ Heparin dose given at 1400 today.  Patient's BMI>30 with weight 121 kg.  Hgb, Platelets WNL.   Plan:  Lovenox 60 mg (0.5 mg/kg) SQ q24h.  First dose at 2200 tonight. Pharmacy will sign off.  Clance Boll 02/23/2013,7:07 PM

## 2013-02-23 NOTE — Progress Notes (Signed)
Tried contacting the psych MD ( Arsen at [phone]) but phone rang endlessly. Will continue to try. Vwilliams,rn.

## 2013-02-23 NOTE — Progress Notes (Signed)
RN notified Dr. Annitta Jersey office that patient's urine output was 200 ml in 12 hours. Awaiting any new orders. Will continue to monitor the patient.

## 2013-02-23 NOTE — Progress Notes (Signed)
ANTIBIOTIC CONSULT NOTE - INITIAL  Pharmacy Consult for Primaxin Indication: E.coli UTI  Allergies  Allergen Reactions  . Food Rash    "lasagna"= rash from the noodles  . Penicillins Hives  . Sulfamethoxazole W-Trimethoprim Hives and Nausea And Vomiting    Patient Measurements: Height: 5' (152.4 cm) Weight: 267 lb 8 oz (121.337 kg) IBW/kg (Calculated) : 45.5  Vital Signs: Temp: 98.3 F (36.8 C) (09/06 1011) Temp src: Oral (09/06 1011) BP: 139/67 mmHg (09/06 1011) Pulse Rate: 124 (09/06 1011) Intake/Output from previous day: 09/05 0701 - 09/06 0700 In: 2325 [P.O.:720; I.V.:1205; IV Piggyback:400] Out: 400 [Urine:400] Intake/Output from this shift:    Labs:  Recent Labs  02/21/13 1945 02/22/13 0450 02/23/13 0552  WBC 11.6* 11.4* 10.3  HGB 13.2 12.7 12.5  PLT 275 282 264  CREATININE 0.59 0.54 0.64   Estimated Creatinine Clearance: 90.6 ml/min (by C-G formula based on Cr of 0.64). No results found for this basename: Rolm Gala, VANCORANDOM, GENTTROUGH, GENTPEAK, GENTRANDOM, TOBRATROUGH, TOBRAPEAK, TOBRARND, AMIKACINPEAK, AMIKACINTROU, AMIKACIN,  in the last 72 hours   Microbiology: Recent Results (from the past 720 hour(s))  URINE CULTURE     Status: None   Collection Time    02/21/13  8:20 PM      Result Value Range Status   Specimen Description URINE, CATHETERIZED   Final   Special Requests NONE   Final   Culture  Setup Time     Final   Value: 02/22/2013 05:42     Performed at [facility] Count     Final   Value: >=100,000 COLONIES/ML     Performed at [facility]   Culture     Final   Value: ESCHERICHIA COLI     Performed at [facility]   Report Status PENDING   Incomplete  MRSA PCR SCREENING     Status: Abnormal   Collection Time    02/21/13 11:40 PM      Result Value Range Status   MRSA by PCR POSITIVE (*) NEGATIVE Final   Comment:            The GeneXpert MRSA Assay (FDA     approved for NASAL  specimens     only), is one component of a     comprehensive MRSA colonization     surveillance program. It is not     intended to diagnose MRSA     infection nor to guide or     monitor treatment for     MRSA infections.     RESULT CALLED TO, READ BACK BY AND VERIFIED WITH:     LEWIS,S/4E @0207  ON 02/22/13 BY KARCZEWSKI,S.    Medical History: Past Medical History  Diagnosis Date  . Schizoaffective disorder   . Urinary tract infection   . Hypertension   . GERD (gastroesophageal reflux disease)   . Hypothyroidism   . Obesity   . Asthma   . COPD (chronic obstructive pulmonary disease)   . CHF (congestive heart failure)   . Seizures   . Morbid obesity   . Chronic pain   . Hypercholesteremia   . Anginal pain   . Myocardial infarction 2002  . Pneumonia     "several times" (05/23/2012)  . Diabetes mellitus     "I'm not diabetic anymore" (05/23/2012)  . Anemia   . History of blood transfusion 1993    "when I had my hysterectomy" (05/23/2012)  . H/O hiatal hernia   .  Daily headache   . Arthritis     "severe; all over my body" (05/23/2012)  . Hypoventilation syndrome     [name] 05/30/2012  . Atrial fibrillation   . Atrial flutter, paroxysmal     [name] 05/30/2012  . Exertional dyspnea   . Shortness of breath     "all the time lately" (05/30/2012)    Medications:  Scheduled:  . aspirin EC  81 mg Oral q morning - 10a  . budesonide  0.25 mg Nebulization BID  . Chlorhexidine Gluconate Cloth  6 each Topical Q0600  . divalproex  250 mg Oral QHS  . heparin  5,000 Units Subcutaneous Q8H  . hydrocortisone cream   Topical BID  . imipenem-cilastatin  500 mg Intravenous Q6H  . levothyroxine  150 mcg Oral QAC breakfast  . metoprolol tartrate  50 mg Oral BID  . mupirocin ointment  1 application Nasal BID  . paliperidone  3 mg Oral Daily  . pantoprazole  40 mg Oral Daily  . [START ON 02/25/2013] polyethylene glycol  17 g Oral 2 times weekly   Infusions:  . sodium chloride 100  mL/hr at 02/23/13 0612   PRN: albuterol, haloperidol lactate, nitroGLYCERIN Assessment:  62 yo F with Hx of E.coli UTI.   Urine cultures from 9/4 resulted as E.coli, sensitivities pending.     Patient is currently on D#3 Cipro, however culture from April of this year shows E.Coli resistant to Flouroquinlones but sensitive to PCN, patient also has a PCN allergy (hives).  Discussed with Dr. Sharyn Lull.  Changed to Primaxin given low cross reactivity with PCN and previous culture sensitive to PCN.  AF, WBC WNL, Renal function stable with CrCl 90 ml/min  Goal of Therapy:  Primaxin per weight/renal function   Plan:  1.) Start Primaxin 500 mg IV q6h 2.) F/u E.Coli sensitivities, if ESBL + then Primaxin will cover  Mackenzie Groom, Loma Messing PharmD Pager #: [phone] 11:17 AM 02/23/2013

## 2013-02-23 NOTE — Progress Notes (Signed)
New order received from MD to renew restraints. Vwilliams,rn.

## 2013-02-23 NOTE — Progress Notes (Signed)
OT Cancellation Note  Patient Details Name: TONIETTE DEVERA MRN: [id number] DOB: 16-Oct-1950   Cancelled Treatment:    Reason Eval/Treat Not Completed: Other (comment).  Checked on pt twice:  Yelling and agitated.  Will check back Monday to see if she will benefit from skilled OT.    Amelia Burgard 02/23/2013, 1:27 PM Marica Otter, OTR/L [phone] 02/23/2013

## 2013-02-24 MED ORDER — FLUCONAZOLE IN SODIUM CHLORIDE 200-0.9 MG/100ML-% IV SOLN
200.0000 mg | INTRAVENOUS | Status: DC
  Administered 2013-02-24 – 2013-02-27 (×4): 200 mg via INTRAVENOUS
  Filled 2013-02-24 (×6): qty 100

## 2013-02-24 MED ORDER — CEFTRIAXONE SODIUM 1 G IJ SOLR
1.0000 g | INTRAMUSCULAR | Status: DC
  Administered 2013-02-24 – 2013-02-27 (×4): 1 g via INTRAVENOUS
  Filled 2013-02-24 (×5): qty 10

## 2013-02-24 NOTE — Progress Notes (Signed)
Subjective:  Patient denies any chest pain or shortness of breath states leg pain is improved.  Objective:  Vital Signs in the last 24 hours: Temp:  [98.3 F (36.8 C)-99.1 F (37.3 C)] 98.3 F (36.8 C) (09/07 0609) Pulse Rate:  [76-124] 76 (09/07 0609) Resp:  [18-20] 20 (09/07 0609) BP: (91-139)/(48-70) 91/48 mmHg (09/07 0609) SpO2:  [97 %-99 %] 99 % (09/07 0609)  Intake/Output from previous day: 09/06 0701 - 09/07 0700 In: 4156.7 [P.O.:1800; I.V.:2156.7; IV Piggyback:200] Out: 1776 [Urine:1775; Stool:1] Intake/Output from this shift:    Physical Exam: Neck: no adenopathy, no carotid bruit, no JVD and supple, symmetrical, trachea midline Lungs: clear to auscultation bilaterally Heart: regular rate and rhythm, S1, S2 normal, no murmur, click, rub or gallop Abdomen: soft, non-tender; bowel sounds normal; no masses,  no organomegaly Extremities: extremities normal, atraumatic, no cyanosis or edema  Lab Results:  Recent Labs  02/22/13 0450 02/23/13 0552  WBC 11.4* 10.3  HGB 12.7 12.5  PLT 282 264    Recent Labs  02/22/13 0450 02/23/13 0552  NA 137 136  K 3.3* 3.9  CL 101 102  CO2 26 25  GLUCOSE 93 106*  BUN 8 7  CREATININE 0.54 0.64   No results found for this basename: TROPONINI, CK, MB,  in the last 72 hours Hepatic Function Panel  Recent Labs  02/21/13 1945  PROT 7.7  ALBUMIN 3.2*  AST 22  ALT 11  ALKPHOS 80  BILITOT 0.4   No results found for this basename: CHOL,  in the last 72 hours No results found for this basename: PROTIME,  in the last 72 hours  Imaging: Imaging results have been reviewed and No results found.  Cardiac Studies:  Assessment/Plan:  Escherichia coli UTI  Schizoaffective disorder  Hypertension  Hypothyroidism  COPD  History of congestive heart failure secondary to diastolic dysfunction  Morbid obesity  Obesity hypoventilation syndrome/obstructive sleep apnea  History of recurrent UTI in the past  GERD  Status post  Atrial flutter with moderate ventricular response  History of tobacco abuse  History of questionable seizure disorder  Hiatus hernia  Plan Continue present management Out of bed to chair Social service for discharge planning Ambulate as tolerated Check duplex ultrasound lower extremities  LOS: 3 days    [name] N 02/24/2013, 9:47 AM

## 2013-02-25 DIAGNOSIS — F259 Schizoaffective disorder, unspecified: Secondary | ICD-10-CM

## 2013-02-25 DIAGNOSIS — M79609 Pain in unspecified limb: Secondary | ICD-10-CM

## 2013-02-25 NOTE — Progress Notes (Signed)
PT Cancellation Note  Patient Details Name: [name] MRN: [id number] DOB: March 21, 1951   Cancelled Treatment:    Reason Eval/Treat Not Completed: Other (comment) (pt very agitated, RN requested no PT today. Will follow.)   Tamala Ser 02/25/2013, 11:55 AM [phone]

## 2013-02-25 NOTE — Progress Notes (Signed)
VASCULAR LAB PRELIMINARY  PRELIMINARY  PRELIMINARY  PRELIMINARY  Bilateral lower extremity venous duplex  completed.    Preliminary report:  Bilateral:  No obvious evidence of DVT, superficial thrombosis, or Baker's Cyst.  Unable to compress in the thigh due to patient cooperation.  Flow spontaneous and phasic.  Unable to image popliteal veins due to patient position/cooperation.  Calf veins not well visualized.    Drayke Grabel, RVT 02/25/2013, 3:39 PM

## 2013-02-25 NOTE — Progress Notes (Signed)
OT Cancellation Note  Patient Details Name: TONNA PALAZZI MRN: [id number] DOB: 05/19/1951   Cancelled Treatment:    Reason Eval/Treat Not Completed: Other (comment) (agitation)  Will check back as pt appropriate.  Alba Cory 02/25/2013, 11:42 AM

## 2013-02-25 NOTE — Progress Notes (Signed)
Subjective:  Patient denies any chest pain or shortness of breath. Eager  to go home but no one able to take care of her at home  Objective:  Vital Signs in the last 24 hours: Temp:  [98.5 F (36.9 C)-99.4 F (37.4 C)] 98.5 F (36.9 C) (09/08 0510) Pulse Rate:  [75-84] 79 (09/08 0510) Resp:  [18-20] 20 (09/08 0510) BP: (117-124)/(75-79) 124/79 mmHg (09/08 0510) SpO2:  [95 %-98 %] 96 % (09/08 0510)  Intake/Output from previous day: 09/07 0701 - 09/08 0700 In: 2280 [P.O.:1080; I.V.:1200] Out: 4375 [Urine:4375] Intake/Output from this shift:    Physical Exam: Neck: no adenopathy, no carotid bruit, no JVD and supple, symmetrical, trachea midline Lungs: clear to auscultation bilaterally Heart: regular rate and rhythm, S1, S2 normal, no murmur, click, rub or gallop Abdomen: soft, non-tender; bowel sounds normal; no masses,  no organomegaly Extremities: extremities normal, atraumatic, no cyanosis or edema  Lab Results:  Recent Labs  02/23/13 0552  WBC 10.3  HGB 12.5  PLT 264    Recent Labs  02/23/13 0552  NA 136  K 3.9  CL 102  CO2 25  GLUCOSE 106*  BUN 7  CREATININE 0.64   No results found for this basename: TROPONINI, CK, MB,  in the last 72 hours Hepatic Function Panel No results found for this basename: PROT, ALBUMIN, AST, ALT, ALKPHOS, BILITOT, BILIDIR, IBILI,  in the last 72 hours No results found for this basename: CHOL,  in the last 72 hours No results found for this basename: PROTIME,  in the last 72 hours  Imaging: Imaging results have been reviewed and No results found.  Cardiac Studies:  Assessment/Plan:  Escherichia coli UTI  Schizoaffective disorder  Hypertension  Hypothyroidism  COPD  History of congestive heart failure secondary to diastolic dysfunction  Morbid obesity  Obesity hypoventilation syndrome/obstructive sleep apnea  History of recurrent UTI in the past  GERD  Status post Atrial flutter with moderate ventricular response   History of tobacco abuse  History of questionable seizure disorder  Hiatus hernia  Plan DC IV fluids changed to Hep-Lock DC Foley Out of bed to chair Awaiting psych consult Social service for discharge planning   LOS: 4 days    Marlia Schewe N 02/25/2013, 8:59 AM

## 2013-02-25 NOTE — Progress Notes (Signed)
CSW saw patient at patient's request. Patient states "she has to go home because she was murdered this morning. She is signing herself out. " CSW stated that she couldn't leave right now. She states " I can because I own this hospital." CSW called behavioral health to make sure that they have a psych consult. Hopefully, this is now resolved and psych MD can come see patient.  Alexia Dinger C. Shizuye Rupert MSW, LCSW [phone]

## 2013-02-25 NOTE — Progress Notes (Signed)
Clinical Social Work Department CLINICAL SOCIAL WORK PSYCHIATRY SERVICE LINE ASSESSMENT 02/25/2013  Patient:  [name]  Account:  [id number]  Admit Date:  02/21/2013  Clinical Social Worker:  Unk Lightning, LCSW  Date/Time:  02/25/2013 11:30 AM Referred by:  Physician  Date referred:  02/25/2013 Reason for Referral  Psychosocial assessment   Presenting Symptoms/Problems (In the person's/family's own words):   Psych consulted to complete psychosocial assessment   Abuse/Neglect/Trauma History (check all that apply)  Denies history   Abuse/Neglect/Trauma Comments:   Psychiatric History (check all that apply)  Outpatient treatment  Inpatient/hospitilization   Psychiatric medications:  Depakote 250 mg  [name] 3 mg   Current Mental Health Hospitalizations/Previous Mental Health History:   Patient reports she was diagnosed with schizophrenia when she was a teenager. Patient reports she is compliant with treatment via ACT team and that she takes all medication as prescribed.   Current provider:   [facility] ACT Team   Place and Date:   [place], [place]   Current Medications:   albuterol, haloperidol lactate, nitroGLYCERIN, traMADol            . aspirin EC  81 mg Oral q morning - 10a  . budesonide  0.25 mg Nebulization BID  . cefTRIAXone (ROCEPHIN)  IV  1 g Intravenous Q24H  . Chlorhexidine Gluconate Cloth  6 each Topical Q0600  . divalproex  250 mg Oral QHS  . enoxaparin (LOVENOX) injection  0.5 mg/kg Subcutaneous Q24H  . fluconazole (DIFLUCAN) IV  200 mg Intravenous Q24H  . hydrocortisone cream   Topical BID  . levothyroxine  150 mcg Oral QAC breakfast  . metoprolol tartrate  50 mg Oral BID  . mupirocin ointment  1 application Nasal BID  . paliperidone  3 mg Oral Daily  . pantoprazole  40 mg Oral Daily  . polyethylene glycol  17 g Oral 2 times weekly   Previous Impatient Admission/Date/Reason:   Per chart review, patient had stay at [facility] in  November 2013 for SI.   Emotional Health / Current Symptoms    Suicide/Self Harm  None reported   Suicide attempt in the past:   Patient denies any previous suicide attempts. Patient denies any SI or HI.   Other harmful behavior:   None reported   Psychotic/Dissociative Symptoms  Auditory Hallucinations  Visual Hallucinations  Delusional   Other Psychotic/Dissociative Symptoms:   Patient is delusional and making statements about being a doctor. Patient later reports she was murdered and needs to call the police to make report. Patient reports she sees her daughter who passed away in the 41s and that dtr talks to her as well.    Attention/Behavioral Symptoms  Inattentive   Other Attention / Behavioral Symptoms:   Patient struggles to remain focused throughout assessment.    Cognitive Impairment  Orientation - Place  Orientation - Self  Orientation - Situation  Orientation - Time   Other Cognitive Impairment:   Patient is alert to person, place, time and situation but is delusional. Patient is able to answer questions such as where she is, the date, and why she is in the hospital but then will make statements about being murdered or being a doctor. Patient also believes that she has several children and believes her doctor is one of her children.    Mood and Adjustment  Unstable/Inconsistent    Stress, Anxiety, Trauma, Any Recent Loss/Stressor  None reported   Anxiety (frequency):   N/A   Phobia (specify):  N/A   Compulsive behavior (specify):   N/A   Obsessive behavior (specify):   N/A   Other:   N/A   Substance Abuse/Use  None   SBIRT completed (please refer for detailed history):  N  Self-reported substance use:   Patient denies any substance use.   Urinary Drug Screen Completed:  N Alcohol level:   N/A    Environmental/Housing/Living Arrangement  Stable housing   Who is in the home:   With friend   Emergency contact:   Clarance-friend   Financial  Medicare   Patient's Strengths and Goals (patient's own words):   Patient reports that she does well at home and that friends and family are supportive.   Clinical Social Worker's Interpretive Summary:   CSW received referral to complete psychosocial assessment. CSW reviewed chart and spoke with RN prior to meeting with patient at bedside. RN has documented bizarre behavior as well. CSW introduced myself and explained role.    When CSW arrived, patient was anxious and continued to report she wanted to leave AMA. Patient then started making statements about needing PTAR to drive her home and needing HH and a hospital bed. CSW encouraged patient to stay at the hospital until these matters could be arranged and patient agreeable.    Patient reports that she was diagnosed with schizophrenia when she was a teenager. Patient reports that she takes all of her medication and that Dr. Lelon Perla prescribes her medication. Patient reports that ACT team visits her when she is at home. CSW called [facility] ACT team and spoke with Trey Paula in order to inquire about their visits and if they could continue to meet with patient. [facility] reports that they had to DC services when patient was admitted to a SNF about 2-3 months ago. CSW contacted SNF in order to determine if referral was made to another agency since [facility] reports they were never contacted about continuing services. CSW will continue to investigate and was informed that another referral could be made to [facility] if patient is interested in services again. CSW will continue to follow and can make referral if patient signs ROI and if psych MD agreeable to plan. At this time, it is unclear if patient is taking medications or if she is involved with any formal supports in the community. CSW will continue to investigate this matter. Patient gave CSW permission to contact Clarance as well. CSW called and left a voicemail but no return call at this time.     Patient agreeable to complete mini mental status exam. Patient did well scoring 25/30 on exam but remains delusional. Per [facility], patient had a history of psychosis. Patient thinks she is a doctor, reports she is dead, and that she is the mother of several children. Although patient is alert and can answer questions regarding place and time, patient is unable to make a plan about caring for herself at home. Patient reports she is paralyzed and cannot walk but feels as long as she gets a hospital bed then she will be able to manage at home. Patient also reports that friend Horticulturist, commercial) can help her but later states that he is actually her son but she wants to tell him face-to-face that she is his mother. Patient remains fixated on returning home but struggles with problem solving skills and creating a plan.    At the moment, patient is agreeable to stay in the hospital. CSW updated RN on plans and encouraged RN to call with any further questions  or if patient requests. CSW has attempted to gather collateral information but still awaiting return calls. CSW will continue to follow and will assist with any recommendations provided by psych MD.   Disposition:  Recommend Psych CSW continuing to support while in hospital

## 2013-02-25 NOTE — Progress Notes (Signed)
Patient increasingly agitated this am.  Patient calling operator stating she died at 825am this morning and that RN should go and check the morgue.  Patient states she is seeing her daughter who she claims is deceased, and her husband who she claims is deceased.  Patient states she wants her vodka and tobacco and cannot stay at the hospital.  Patient states she is signing herself out.  Social worker on floor, assisting with patient, attempting to get in touch with psych for evaluation.  Will continue to monitor patient.

## 2013-02-25 NOTE — Progress Notes (Signed)
Clinical Social Work  CSW received a message to Chemical engineer. CSW called and left another voicemail with CSW contact information. CSW also spoke with SNF who reports that patient left AMA so no psychiatric follow up was able to be scheduled for patient.  CSW will continue to follow.  [place], [place] [phone]

## 2013-02-25 NOTE — Consult Note (Addendum)
[facility] Face-to-Face Psychiatry Consult   Reason for Consult:  Psychosis Referring Physician:  Dr. Evlyn Courier is an 62 y.o. female.  Assessment: AXIS I:  Schizoaffective Disorder AXIS II:  Deferred AXIS III:   Past Medical History  Diagnosis Date  . Schizoaffective disorder   . Urinary tract infection   . Hypertension   . GERD (gastroesophageal reflux disease)   . Hypothyroidism   . Obesity   . Asthma   . COPD (chronic obstructive pulmonary disease)   . CHF (congestive heart failure)   . Seizures   . Morbid obesity   . Chronic pain   . Hypercholesteremia   . Anginal pain   . Myocardial infarction 2002  . Pneumonia     "several times" (05/23/2012)  . Diabetes mellitus     "I'm not diabetic anymore" (05/23/2012)  . Anemia   . History of blood transfusion 1993    "when I had my hysterectomy" (05/23/2012)  . H/O hiatal hernia   . Daily headache   . Arthritis     "severe; all over my body" (05/23/2012)  . Hypoventilation syndrome     [name] 05/30/2012  . Atrial fibrillation   . Atrial flutter, paroxysmal     [name] 05/30/2012  . Exertional dyspnea   . Shortness of breath     "all the time lately" (05/30/2012)   AXIS IV:  problems related to social environment, problems with access to health care services and problems with primary support group AXIS V:  31-40 impairment in reality testing  Plan:  No evidence of imminent risk to self or others at present.   Medication management  Subjective:   SARABETH BENTON is a 62 y.o. female patient admitted with change in mental status/UTI.Marland Kitchen  HPI:  Patient seen chart reviewed.  Patient is 62 year old female with past history of multiple medical problems including hypertension, hypothyroidism, COPD, morbid obesity, sleep apnea and history of UTI.  She was admitted because of confusion, incontinence of urine and feces and unable to ambulate.  Patient also has low-grade fever and noticed UTI.  Patient appears confused and  consult was called as patient has hallucination and this is her affect of disorder.  The patient has a long history of psychiatric illness.  She was seen by consultation liaison service as in February 2014.  At that time she was taking trazodone Depakote Risperdal and Cogentin.  Patient has history of auditory hallucination and suicidal attempt 30 years ago.  Patient appears confused and at times incoherent.  She did not provide much information.  Most of the information was obtained from an electronic medical records.  The patient has history of psychiatric hospitalization in [facility] in December 2013.  She was diagnosed with schizoaffective disorder and she was discharged on Depakote Seroquel and recommended to followup with [facility] team.  Patient was in assisted living facility until she recently signed out AMA , decompensating and not taking her psychiatric medication.  She told that she is seeing Dr. Allyne Gee at [facility] but did not provide much detail.  Patient admitted hallucination, endorsed paranoia and appears delusional .  She does not remember very well what circumstances brought her to the hospital.  However she was cooperative with this Clinical research associate but easily distracted and incoherent.  She denies any suicidal thoughts .  At this time she is taking [name] 3 mg but unclear who is prescribing her psychotropic medication.  HPI Elements:   Location:  Medical floor. Quality:  Fair.  Severity:  Unable to function.  Past Psychiatric History: Past Medical History  Diagnosis Date  . Schizoaffective disorder   . Urinary tract infection   . Hypertension   . GERD (gastroesophageal reflux disease)   . Hypothyroidism   . Obesity   . Asthma   . COPD (chronic obstructive pulmonary disease)   . CHF (congestive heart failure)   . Seizures   . Morbid obesity   . Chronic pain   . Hypercholesteremia   . Anginal pain   . Myocardial infarction 2002  . Pneumonia     "several times" (05/23/2012)  .  Diabetes mellitus     "I'm not diabetic anymore" (05/23/2012)  . Anemia   . History of blood transfusion 1993    "when I had my hysterectomy" (05/23/2012)  . H/O hiatal hernia   . Daily headache   . Arthritis     "severe; all over my body" (05/23/2012)  . Hypoventilation syndrome     [name] 05/30/2012  . Atrial fibrillation   . Atrial flutter, paroxysmal     [name] 05/30/2012  . Exertional dyspnea   . Shortness of breath     "all the time lately" (05/30/2012)    reports that she quit smoking about 12 years ago. Her smoking use included Cigarettes. She has a 24 pack-year smoking history. Her smokeless tobacco use includes Snuff and Chew. She reports that she does not drink alcohol or use illicit drugs. Family History  Problem Relation Age of Onset  . Heart disease Father   . Cancer Mother 75    Breast     Living Arrangements: Alone   Abuse/Neglect [facility]) Physical Abuse: Denies Verbal Abuse: Denies Sexual Abuse: Denies Allergies:   Allergies  Allergen Reactions  . Food Rash    "lasagna"= rash from the noodles  . Penicillins Hives  . Sulfamethoxazole W-Trimethoprim Hives and Nausea And Vomiting    ACT Assessment Complete:  No:   Past Psychiatric History: Patient has multiple psychiatric hospitalization due to psychosis and suicidal attempt.  She has been seen by act in the past.  As per chronic medical record she is on Risperdal and, Haldol and Seroquel in the past. Place of Residence:  Unknown Marital Status:  Unknown Employed/Unemployed:  Unemployed Education:  Unknown Family Supports:  Unknown Objective: Blood pressure 132/78, pulse 67, temperature 97.8 F (36.6 C), temperature source Oral, resp. rate 18, height 5' (1.524 m), weight 267 lb 8 oz (121.337 kg), SpO2 99.00%.Body mass index is 52.24 kg/(m^2). Results for orders placed during the hospital encounter of 02/21/13 (from the past 72 hour(s))  CBC     Status: Abnormal   Collection Time    02/23/13  5:52 AM       Result Value Range   WBC 10.3  4.0 - 10.5 K/uL   RBC 3.81 (*) 3.87 - 5.11 MIL/uL   Hemoglobin 12.5  12.0 - 15.0 g/dL   HCT 57.8  46.9 - 62.9 %   MCV 101.3 (*) 78.0 - 100.0 fL   MCH 32.8  26.0 - 34.0 pg   MCHC 32.4  30.0 - 36.0 g/dL   RDW 52.8  41.3 - 24.4 %   Platelets 264  150 - 400 K/uL  BASIC METABOLIC PANEL     Status: Abnormal   Collection Time    02/23/13  5:52 AM      Result Value Range   Sodium 136  135 - 145 mEq/L   Potassium 3.9  3.5 - 5.1 mEq/L  Chloride 102  96 - 112 mEq/L   CO2 25  19 - 32 mEq/L   Glucose, Bld 106 (*) 70 - 99 mg/dL   BUN 7  6 - 23 mg/dL   Creatinine, Ser 4.54  0.50 - 1.10 mg/dL   Calcium 09.8  8.4 - 11.9 mg/dL   GFR calc non Af Amer >90  >90 mL/min   GFR calc Af Amer >90  >90 mL/min   Comment: (NOTE)     The eGFR has been calculated using the CKD EPI equation.     This calculation has not been validated in all clinical situations.     eGFR's persistently <90 mL/min signify possible Chronic Kidney     Disease.   Labs are reviewed and are pertinent for WBC count and urinalysis is positive for ketones and nitrites.  Current Facility-Administered Medications  Medication Dose Route Frequency Provider Last Rate Last Dose  . 0.9 %  sodium chloride infusion   Intravenous Continuous [name], MD 20 mL/hr at 02/25/13 1023    . albuterol (PROVENTIL HFA;VENTOLIN HFA) 108 (90 BASE) MCG/ACT inhaler 2 puff  2 puff Inhalation Q6H PRN [name], MD      . aspirin EC tablet 81 mg  81 mg Oral q morning - 10a [name], MD   81 mg at 02/25/13 1023  . budesonide (PULMICORT) nebulizer solution 0.25 mg  0.25 mg Nebulization BID [name], MD   0.25 mg at 02/25/13 0904  . cefTRIAXone (ROCEPHIN) 1 g in dextrose 5 % 50 mL IVPB  1 g Intravenous Q24H [name], MD   1 g at 02/24/13 1300  . Chlorhexidine Gluconate Cloth 2 % PADS 6 each  6 each Topical Q0600 [name], MD   6 each at 02/25/13 0505  . divalproex (DEPAKOTE ER) 24 hr tablet  250 mg  250 mg Oral QHS [name], MD   250 mg at 02/24/13 2031  . enoxaparin (LOVENOX) injection 60 mg  0.5 mg/kg Subcutaneous Q24H Teressa Lower, RPH   60 mg at 02/24/13 2030  . fluconazole (DIFLUCAN) IVPB 200 mg  200 mg Intravenous Q24H [name], MD   200 mg at 02/25/13 1203  . haloperidol lactate (HALDOL) injection 1 mg  1 mg Intravenous Q8H PRN [name], MD   1 mg at 02/25/13 1558  . hydrocortisone cream 1 %   Topical BID [name], MD      . levothyroxine (SYNTHROID, LEVOTHROID) tablet 150 mcg  150 mcg Oral QAC breakfast [name], MD   150 mcg at 02/25/13 1023  . metoprolol (LOPRESSOR) tablet 50 mg  50 mg Oral BID [name], MD   50 mg at 02/25/13 1023  . mupirocin ointment (BACTROBAN) 2 % 1 application  1 application Nasal BID [name], MD   1 application at 02/25/13 1023  . nitroGLYCERIN (NITROSTAT) SL tablet 0.4 mg  0.4 mg Sublingual Q5 min PRN [name], MD      . paliperidone ([name]) 24 hr tablet 3 mg  3 mg Oral Daily [name], MD   3 mg at 02/25/13 1022  . pantoprazole (PROTONIX) EC tablet 40 mg  40 mg Oral Daily [name], MD   40 mg at 02/25/13 1023  . polyethylene glycol (MIRALAX / GLYCOLAX) packet 17 g  17 g Oral 2 times weekly [name], MD   17 g at 02/25/13  1203  . traMADol (ULTRAM) tablet 50 mg  50 mg Oral Q8H PRN [name], MD   50 mg at 02/23/13 1917    Psychiatric Specialty Exam:     Blood pressure 132/78, pulse 67, temperature 97.8 F (36.6 C), temperature source Oral, resp. rate 18, height 5' (1.524 m), weight 267 lb 8 oz (121.337 kg), SpO2 99.00%.Body mass index is 52.24 kg/(m^2).  General Appearance: Casual  Eye Contact::  Fair  Speech:  Slow and Slurred  Volume:  Decreased  Mood:  Anxious  Affect:  Labile  Thought Process:  Irrelevant  Orientation:  Full (Time, Place, and Person)  Thought Content:  Delusions, Hallucinations: Auditory, Paranoid Ideation and Rumination  Suicidal  Thoughts:  No  Homicidal Thoughts:  No  Memory:  poor  Judgement:  Impaired  Insight:  Lacking  Psychomotor Activity:  Decreased  Concentration:  Poor  Recall:  Poor  Akathisia:  No  Handed:  Right  AIMS (if indicated):     Assets:  Housing  Sleep:      Treatment Plan Summary: Medication management Patient is getting treatment for UTI which could be the reason for her change in mental status .  Also It is unlikely that patient is taking her psychotropic medication.  Recommend to continue [name] 3mg  daily however if psychosis does not resolved consider 6 mg if not medically contraindicated. call consultation liaison services if he every question her need further followup.  Use Haldol 1 mg to 2 mg IM every 6 when necessary for severe agitation if not medically contraindicated.  Keelee Yankey T. 02/25/2013 5:34 PM

## 2013-02-26 NOTE — Clinical Social Work Placement (Addendum)
     Clinical Social Work Department CLINICAL SOCIAL WORK PLACEMENT NOTE 03/04/2013  Patient:  [name], [name]  Account Number:  [id number] Admit date:  02/21/2013  Clinical Social Worker:  Becky Sax, LCSW  Date/time:  02/26/2013 12:00 M  Clinical Social Work is seeking post-discharge placement for this patient at the following level of care:   SKILLED NURSING   (*CSW will update this form in Epic as items are completed)   02/26/2013  Patient/family provided with [facility] Department of Clinical Social Works list of facilities offering this level of care within the geographic area requested by the patient (or if unable, by the patients family).  02/26/2013  Patient/family informed of their freedom to choose among providers that offer the needed level of care, that participate in Medicare, Medicaid or managed care program needed by the patient, have an available bed and are willing to accept the patient.  02/26/2013  Patient/family informed of [facility] ownership interest in [facility], as well as of the fact that they are under no obligation to receive care at this facility.  PASARR submitted to EDS on 02/26/2013 PASARR number received from EDS on 03/04/2013  FL2 transmitted to all facilities in geographic area requested by pt/family on  02/26/2013 FL2 transmitted to all facilities within larger geographic area on   Patient informed that his/her managed care company has contracts with or will negotiate with  certain facilities, including the following:     Patient/family informed of bed offers received:  03/04/2013 Patient chooses bed at [facility] [place] Physician recommends and patient chooses bed at    Patient to be transferred to [facility] [place] on  03/04/2013 Patient to be transferred to facility by ptar  The following physician request were entered in Epic:   Additional Comments:

## 2013-02-26 NOTE — Evaluation (Signed)
Occupational Therapy Evaluation Patient Details Name: [name] MRN: [id number] DOB: May 25, 1951 Today's Date: 02/26/2013 Time: 0865-7846 OT Time Calculation (min): 18 min  OT Assessment / Plan / Recommendation History of present illness Patient is 62 year old female with past medical history significant for multiple medical problems i.e. hypertension, hypothyroidism, COPD, history of congestive heart failure secondary to diastolic dysfunction in the past, morbid obesity, obesity hypoventilation syndrome/obstructive sleep apnea, history of schizoaffective disorder, history of UTI in the past, and GERD, history of paroxysmal atrial flutter, history of tobacco abuse, history of questionable seizure disorder, height is hernia, [facility] signed [facility] approximately one week was brought to the ER by EMS because of inability to ambulate and low-grade fever and incontinence of feces and urine and was noted to have UTI.     Clinical Impression   Pt was admitted with the above problems.  She will benefit from skilled OT to increase safety and independence with adls.  Goals will focus on building activity tolerance:  See below.      OT Assessment  Patient needs continued OT Services    Follow Up Recommendations  SNF    Barriers to Discharge      Equipment Recommendations   (to be further assessed)    Recommendations for Other Services    Frequency  Min 2X/week    Precautions / Restrictions Restrictions Weight Bearing Restrictions: No   Pertinent Vitals/Pain No pain    ADL  Grooming: Wash/dry face;Brushing hair;Set up Where Assessed - Grooming: Supine, head of bed up Transfers/Ambulation Related to ADLs: pt partially rolls with rail:  moves upper body only.   Has not been getting OOB, per pt, for quite some time ADL Comments: Able to perform UB adls with set up:  performed grooming.  Total assist for LB adls without AE.  Pt states "[name]" helped her at home.  Per  chart, she was at [facility] and left AMA   OT Diagnosis: Generalized weakness  OT Problem List: Decreased strength;Decreased activity tolerance;Decreased knowledge of use of DME or AE;Obesity OT Treatment Interventions: Self-care/ADL training;DME and/or AE instruction;Patient/family education;Therapeutic activities   OT Goals(Current goals can be found in the care plan section) Acute Rehab OT Goals OT Goal Formulation: With patient Time For Goal Achievement: 03/12/13 Potential to Achieve Goals: Fair ADL Goals Pt Will Perform Lower Body Bathing: with mod assist;with adaptive equipment (rolling in bed) Additional ADL Goal #1: pt will roll to bil sides with min A , using rails for adls Additional ADL Goal #2: Pt will tolerate sitting eob with A x 2, pt 40% for mobility and min A to maintain x 5 minutes  Visit Information  Last OT Received On: 02/26/13 Assistance Needed: +1        Prior Functioning     Home Living Additional Comments: Pt now states she wants to go back to [facility] Prior Function Level of Independence: Needs assistance Communication Communication: No difficulties         Vision/Perception     Cognition  Cognition Arousal/Alertness: Awake/alert Behavior During Therapy: WFL for tasks assessed/performed Overall Cognitive Status: No family/caregiver present to determine baseline cognitive functioning    Extremity/Trunk Assessment Upper Extremity Assessment Upper Extremity Assessment: Overall WFL for tasks assessed     Mobility       Exercise     Balance     End of Session OT - End of Session Activity Tolerance: Patient tolerated treatment well Patient left: in bed;with call  bell/phone within reach  GO     [name] 02/26/2013, 2:48 PM Marica Otter, OTR/L [phone] 02/26/2013

## 2013-02-26 NOTE — Clinical Social Work Psychosocial (Signed)
     Clinical Social Work Department BRIEF PSYCHOSOCIAL ASSESSMENT 02/26/2013  Patient:  [name], [name]     Account Number:  [id number]     Admit date:  02/21/2013  Clinical Social Worker:  [name]  Date/Time:  02/26/2013 12:00 M  Referred by:  Physician  Date Referred:  02/26/2013 Referred for  SNF Placement   Other Referral:   Interview type:  Patient Other interview type:   POA    PSYCHOSOCIAL DATA Living Status:  OTHER Admitted from facility:   Level of care:   Primary support name:  [name] Primary support relationship to patient:  NONE Degree of support available:   poor    CURRENT CONCERNS Current Concerns  Post-Acute Placement   Other Concerns:    SOCIAL WORK ASSESSMENT / PLAN CSW attempted to interview patient. patient is alert and oriented X3 but is hallucinating. patient demands to be released from the hospital but patient was found to not have capacity. CSW spoke with patient's POA, [name]. He states that he probably wont be able to give patient the care she needs upon discharge. He is in agreement for patient to go back [name] states that they will be able to take patient back but that they will need a full level 2, not a thirty day note.   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 POA is realistic on his limitations to care for patient and would like her placed.

## 2013-02-26 NOTE — Evaluation (Signed)
Physical Therapy Evaluation Patient Details Name: [name] MRN: [id number] DOB: Mar 18, 1951 Today's Date: 02/26/2013 Time: 4098-1191 PT Time Calculation (min): 17 min  PT Assessment / Plan / Recommendation History of Present Illness  Patient is 62 year old female with past medical history significant for multiple medical problems i.e. hypertension, hypothyroidism, COPD, history of congestive heart failure secondary to diastolic dysfunction in the past, morbid obesity, obesity hypoventilation syndrome/obstructive sleep apnea, history of schizoaffective disorder, history of UTI in the past, and GERD, history of paroxysmal atrial flutter, history of tobacco abuse, history of questionable seizure disorder, height is hernia, [facility] signed out AMA approximately one week was brought to the ER by EMS because of inability to ambulate and low-grade fever and incontinence of feces and urine and was noted to have UTI.    Clinical Impression  Pt is not able to participate with PT at this time, but she does appear to need continued  PT to mobilize LEs and progress with functional mobility as her psychiatric status stabilized    PT Assessment  All further PT needs can be met in the next venue of care    Follow Up Recommendations  SNF    Does the patient have the potential to tolerate intense rehabilitation      Barriers to Discharge        Equipment Recommendations  None recommended by PT    Recommendations for Other Services     Frequency      Precautions / Restrictions Restrictions Weight Bearing Restrictions: No   Pertinent Vitals/Pain Pt screams out in pain when LEs are touched      Mobility  Bed Mobility Bed Mobility: Rolling Right;Rolling Left Rolling Right: 6: Modified independent (Device/Increase time);With rail Rolling Left: 6: Modified independent (Device/Increase time);With rail Details for Bed Mobility Assistance: Pt can move arms and upper body to  partially roll upper body only.  she does not roll pelvis or weight shift in the bed.  Pt does not move legs Transfers Transfers: Not assessed Ambulation/Gait Ambulation/Gait Assistance: Not tested (comment)    Exercises     PT Diagnosis: Generalized weakness;Acute pain  PT Problem List: Decreased strength;Decreased range of motion;Pain;Decreased mobility;Decreased activity tolerance;Decreased cognition PT Treatment Interventions:       PT Goals(Current goals can be found in the care plan section) Acute Rehab PT Goals PT Goal Formulation: Patient unable to participate in goal setting Time For Goal Achievement: 03/12/13 Potential to Achieve Goals: Fair  Visit Information  Last PT Received On: 02/26/13 Assistance Needed: +1 History of Present Illness: Patient is 62 year old female with past medical history significant for multiple medical problems i.e. hypertension, hypothyroidism, COPD, history of congestive heart failure secondary to diastolic dysfunction in the past, morbid obesity, obesity hypoventilation syndrome/obstructive sleep apnea, history of schizoaffective disorder, history of UTI in the past, and GERD, history of paroxysmal atrial flutter, history of tobacco abuse, history of questionable seizure disorder, height is hernia, [facility] signed out AMA approximately one week was brought to the ER by EMS because of inability to ambulate and low-grade fever and incontinence of feces and urine and was noted to have UTI.         Prior Functioning  Home Living Additional Comments: Pt states she lives with someone and she wants to go home Prior Function Comments: Pt states she did not get out of bed at home Communication Communication: No difficulties    Cognition  Cognition Arousal/Alertness: Awake/alert Behavior During Therapy: Agitated (easily  agitated when legs are touched) Overall Cognitive Status: No family/caregiver present to determine baseline  cognitive functioning (Pt states:"I'm paralyzed, I'm crippled and I want to go home)    Extremity/Trunk Assessment Upper Extremity Assessment Upper Extremity Assessment: Defer to OT evaluation Lower Extremity Assessment Lower Extremity Assessment: RLE deficits/detail;LLE deficits/detail RLE Deficits / Details: Pt cries out loudly whenever legs are touched. she has minimal movement of right dorsi/plantaflexion, attempted to raise leg up on pillow for pressure relief on heel, but pt continued to cry out loudly Not able to assess ROM or strength RLE: Unable to fully assess due to pain LLE Deficits / Details: Leg resting in external rotation with slight knee flexion.  Pt would not allow leg to be touched LLE: Unable to fully assess due to pain Cervical / Trunk Assessment Cervical / Trunk Assessment: Other exceptions Cervical / Trunk Exceptions: pt with obese abdomen and  appears to have generalized muscle atrophy   Balance    End of Session PT - End of Session Activity Tolerance: Patient limited by pain Patient left: in bed Nurse Communication: Mobility status;Precautions (needs repositioning for pressure relief)  GP    Bayard Hugger. Manson Passey, PT [phone] 02/26/2013, 10:51 AM

## 2013-02-26 NOTE — Progress Notes (Signed)
Subjective:  Patient denies any chest pain or shortness of breath. Agrees to go to skilled nursing facility now  Objective:  Vital Signs in the last 24 hours: Temp:  [98 F (36.7 C)-98.7 F (37.1 C)] 98.1 F (36.7 C) (09/09 1247) Pulse Rate:  [58-77] 58 (09/09 1247) Resp:  [16-20] 18 (09/09 1247) BP: (104-131)/(54-84) 114/68 mmHg (09/09 1247) SpO2:  [96 %-98 %] 98 % (09/09 1247)  Intake/Output from previous day: 09/08 0701 - 09/09 0700 In: 3242.3 [P.O.:2400; I.V.:742.3; IV Piggyback:100] Out: 1050 [Urine:1050] Intake/Output from this shift:    Physical Exam: Neck: no adenopathy, no carotid bruit, no JVD and supple, symmetrical, trachea midline Lungs: clear to auscultation bilaterally Heart: regular rate and rhythm, S1, S2 normal, no murmur, click, rub or gallop Abdomen: soft, non-tender; bowel sounds normal; no masses,  no organomegaly  Lab Results: No results found for this basename: WBC, HGB, PLT,  in the last 72 hours No results found for this basename: NA, K, CL, CO2, GLUCOSE, BUN, CREATININE,  in the last 72 hours No results found for this basename: TROPONINI, CK, MB,  in the last 72 hours Hepatic Function Panel No results found for this basename: PROT, ALBUMIN, AST, ALT, ALKPHOS, BILITOT, BILIDIR, IBILI,  in the last 72 hours No results found for this basename: CHOL,  in the last 72 hours No results found for this basename: PROTIME,  in the last 72 hours  Imaging: Imaging results have been reviewed and No results found.  Cardiac Studies:  Assessment/Plan:  Escherichia coli UTI  Schizoaffective disorder  Hypertension  Hypothyroidism  COPD  History of congestive heart failure secondary to diastolic dysfunction  Morbid obesity  Obesity hypoventilation syndrome/obstructive sleep apnea  History of recurrent UTI in the past  GERD  Status post Atrial flutter with moderate ventricular response  History of tobacco abuse  History of questionable seizure disorder   Hiatus hernia  Plan Continue present management Awaiting skilled nursing facility  LOS: 5 days    Selina Tapper N 02/26/2013, 4:35 PM

## 2013-02-26 NOTE — Progress Notes (Signed)
CSW referred patient for level 2 passar.  [name] MSW, LCSW [phone]

## 2013-02-26 NOTE — Progress Notes (Signed)
Clinical Social Work Progress Note PSYCHIATRY SERVICE LINE 02/26/2013  Patient:  [name]  Account:  [id number]  Admit Date:  02/21/2013  Clinical Social Worker:  Unk Lightning, LCSW  Date/Time:  02/26/2013 11:30 AM  Review of Patient  Overall Medical Condition:   Patient reports she is feeling better and is ready to DC today.   Participation Level:  Active  Participation Quality  Appropriate   Other Participation Quality:   Patient engaged and appropriate throughout session.   Affect  Appropriate   Cognitive  Alert   Reaction to Medications/Concerns:   None reported   Modes of Intervention  Support  Solution-Focused   Summary of Progress/Plan at Discharge   CSW met with patient at bedside. Patient was laying in bed and speaking on the phone when CSW arrived. Patient hung up phone and is agreeable to talk with CSW.    Patient reports she is feeling better and wants to DC home. Patient reports that Clarance will care for her at home but that she needs a hospital bed and lift prior to returning home. Patient also reported that she needs HH and aides in order to be safe at home. CSW and patient discussed patient's safety with returning home when she requires assistance for ADL. Patient understanding and agreeable to return to [facility]. Patient reports she wants to DC today but CSW explained that paperwork needs to be completed prior to DC. Patient reports understanding and agreeable to wait for paperwork to be completed. Patient reported she will call and talk with [name] to see if she could get the same room she stayed in last time.    Patient was appropriate during assessment. Patient maintained appropriate eye contact and was alert throughout session. Patient remained delusional when talking about she just came alive again this morning and was dead yesterday but showed better insight when talking about her safety and creating a safe DC plan.    CSW will  continue to follow throughout hospitalization.      [place], [place] [phone]

## 2013-02-27 NOTE — Progress Notes (Signed)
Clinical Social Work Progress Note PSYCHIATRY SERVICE LINE 02/27/2013  Patient:  [name]  Account:  [id number]  Admit Date:  02/21/2013  Clinical Social Worker:  Unk Lightning, LCSW  Date/Time:  02/27/2013 11:45 AM  Review of Patient  Overall Medical Condition:   Patient reports she is ready to DC to [facility]. Level 2 pasarr is barrier at this time.   Participation Level:  Active  Participation Quality  Attentive   Other Participation Quality:   Patient alert but remains delusional.   Affect  Incongruent   Cognitive  Delusional   Reaction to Medications/Concerns:   CSW and RN discussed psych MD recommendations for increase in medications. RN to discuss with attending MD.   Modes of Intervention  Support  Reality-Testing   Summary of Progress/Plan at Discharge   CSW met with patient at bedside. Patient laying in bed but yelling out into the room. When CSW met with patient, patient participated in assessment.    Patient reports that she is ready to go to SNF. CSW explained that state has to assign number prior to DC. 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 Patient continued to report that she was pregnant with triplets and that she was dead but trying to come back to life.    CSW attempted reality-testing inventions with patient but patient remains delusional. CSW assisted patient in ordering lunch and reminded her that we need authorization from state prior to SNF DC. Patient agreeable to plans at this time.      [place], [place] [phone]

## 2013-02-27 NOTE — Progress Notes (Signed)
Subjective:  Patient denies any chest pain or shortness of breath. Occasional periods of confusion and delusions. Objective:  Vital Signs in the last 24 hours: Temp:  [98.3 F (36.8 C)] 98.3 F (36.8 C) (09/09 2300) Pulse Rate:  [75-80] 75 (09/09 2300) Resp:  [18] 18 (09/09 2300) BP: (128-130)/(80) 128/80 mmHg (09/09 2300) SpO2:  [97 %] 97 % (09/09 2300)  Intake/Output from previous day:   Intake/Output from this shift:    Physical Exam: Neck: no adenopathy, no carotid bruit, no JVD and supple, symmetrical, trachea midline Lungs: clear to auscultation bilaterally Heart: regular rate and rhythm, S1, S2 normal, no murmur, click, rub or gallop Abdomen: soft, non-tender; bowel sounds normal; no masses,  no organomegaly Extremities: extremities normal, atraumatic, no cyanosis or edema  Lab Results: No results found for this basename: WBC, HGB, PLT,  in the last 72 hours No results found for this basename: NA, K, CL, CO2, GLUCOSE, BUN, CREATININE,  in the last 72 hours No results found for this basename: TROPONINI, CK, MB,  in the last 72 hours Hepatic Function Panel No results found for this basename: PROT, ALBUMIN, AST, ALT, ALKPHOS, BILITOT, BILIDIR, IBILI,  in the last 72 hours No results found for this basename: CHOL,  in the last 72 hours No results found for this basename: PROTIME,  in the last 72 hours  Imaging: Imaging results have been reviewed and No results found.  Cardiac Studies:  Assessment/Plan:  Resolving Escherichia coli UTI  Schizoaffective disorder  Hypertension  Hypothyroidism  COPD  History of congestive heart failure secondary to diastolic dysfunction  Morbid obesity  Obesity hypoventilation syndrome/obstructive sleep apnea  History of recurrent UTI in the past  GERD  Status post Atrial flutter with moderate ventricular response  History of tobacco abuse  History of questionable seizure disorder  Hiatus hernia  Plan Continue present  management Out of bed to chair Awaiting skilled nursing facility  LOS: 6 days    [name] N 02/27/2013, 12:50 PM

## 2013-02-28 LAB — CULTURE, BLOOD (ROUTINE X 2)
Culture: NO GROWTH
Culture: NO GROWTH

## 2013-02-28 MED ORDER — LORAZEPAM 2 MG/ML IJ SOLN
1.0000 mg | Freq: Four times a day (QID) | INTRAMUSCULAR | Status: DC | PRN
  Administered 2013-02-28: 1 mg via INTRAMUSCULAR
  Filled 2013-02-28: qty 1

## 2013-02-28 MED ORDER — HALOPERIDOL LACTATE 5 MG/ML IJ SOLN
1.0000 mg | Freq: Four times a day (QID) | INTRAMUSCULAR | Status: DC | PRN
  Filled 2013-02-28: qty 1

## 2013-02-28 MED ORDER — CEPHALEXIN 500 MG PO CAPS
500.0000 mg | ORAL_CAPSULE | Freq: Three times a day (TID) | ORAL | Status: DC
  Administered 2013-02-28 – 2013-03-04 (×12): 500 mg via ORAL
  Filled 2013-02-28 (×15): qty 1

## 2013-02-28 MED ORDER — HALOPERIDOL LACTATE 5 MG/ML IJ SOLN
5.0000 mg | Freq: Four times a day (QID) | INTRAMUSCULAR | Status: DC | PRN
  Administered 2013-02-28 – 2013-03-03 (×7): 5 mg via INTRAMUSCULAR
  Filled 2013-02-28 (×6): qty 1

## 2013-02-28 NOTE — Progress Notes (Signed)
Patient pulled out IV this morning and has refused to be stuck again for new IV access thus far. Will reattempt later this afternoon. Angelena Form, RN

## 2013-02-28 NOTE — Plan of Care (Cosign Needed)
CTSP re: agitation and threats to leave AMA- notes reviewed- pt followed by psych social worker documents patient delusional and unable to be redirected- I spoke with patient and she wants her purse and her snuff- ordered IM Haldol since no IV and Ativan prn after review of EKG (Qtc 473)- noted Harwani is primary and TRH not in consulting role noting was paged by staff in error- made staff and nursing supervisor aware- RN instructed to page Harwani or on call MD to make aware of my interaction and orders for patient and my recommendation to formally involve psychiatrist at this juncture especially if above measures ineffective- pt physically does not appear as if she could actually get up and leave the building and has not been violent toward the staff.  Junious Silk, ANP

## 2013-02-28 NOTE — Progress Notes (Signed)
Clinical Social Work  Patient remains delusional. CSW met with patient again at bedside. Patient wanted to tell CSW a story about being a doctor. 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 Patient is unable to be redirected and is screaming in the hallway often. CSW will continue to follow.  [place], [place] [phone]

## 2013-02-28 NOTE — Progress Notes (Signed)
Discussed with patient that she would not be able to have her snuff as Dr, Sharyn Lull told her because we are a tobacco free facility.  She did not appear to be upset but unsure if she totally understands the concept of no tobacco/snuff.

## 2013-02-28 NOTE — Progress Notes (Signed)
Clinical Social Work Progress Note PSYCHIATRY SERVICE LINE 02/28/2013  Patient:  [name]  Account:  [id number]  Admit Date:  02/21/2013  Clinical Social Worker:  Unk Lightning, LCSW  Date/Time:  02/28/2013 09:30 AM  Review of Patient  Overall Medical Condition:   Patient reports she is ready to leave and wants to go to [facility] today.   Participation Level:  Active  Participation Quality  Other - See comment   Other Participation Quality:   Patient fixated on DC and struggles with discussing other topics.   Affect  Labile   Cognitive  Delusional   Reaction to Medications/Concerns:   CSW and RN discussed psych MD note on 02/25/13 recommending [name] dosing if patient remains psychotic. RN to discuss medications with attending MD.   Modes of Intervention  Support   Summary of Progress/Plan at Discharge   CSW met with patient at bedside. RN reports that patient has been yelling this morning and pulled out IV. CSW met with patient while patient was laying in the bed and watching TV.    Patient reports she did not sleep well last night and does not have an appetite. Patient had breakfast tray and was eating during assessment. Patient reports that she does not want to be in the hospital and is ready to DC to [facility]. CSW explained the need of the pasarr number and explained that until number was received, patient could not DC to SNF. Patient is upset with this information and feels she should be able to DC today.    Patient reports she is going to call law enforcement and file a lawsuit because the hospital is forcing treatment on her. Patient is unable to be redirected and tells CSW she needs to use the phone. CSW informed charge RN of patient's concerns. Patient remains delusional and speaks about talking to dead family members.    Unit CSW to follow up on SNF placement.

## 2013-02-28 NOTE — Progress Notes (Signed)
Subjective:  Patient denies any chest pain or shortness of breath. Had episode of agitation and confusion and delusion earlier when pulled out her IV. Patient explained that bed will be available on Monday to which she agrees. Also agrees for restarting IV  Objective:  Vital Signs in the last 24 hours: Temp:  [98.1 F (36.7 C)-98.3 F (36.8 C)] 98.2 F (36.8 C) (09/11 0628) Pulse Rate:  [66-72] 70 (09/11 0958) Resp:  [18-20] 20 (09/11 0958) BP: (106-119)/(66-71) 108/71 mmHg (09/11 0628) SpO2:  [94 %-96 %] 96 % (09/11 0958)  Intake/Output from previous day: 09/10 0701 - 09/11 0700 In: 1053 [I.V.:953; IV Piggyback:100] Out: -  Intake/Output from this shift:    Physical Exam: Neck: no adenopathy, no carotid bruit, no JVD and supple, symmetrical, trachea midline Lungs: clear to auscultation bilaterally Heart: regular rate and rhythm, S1, S2 normal, no murmur, click, rub or gallop Abdomen: soft, non-tender; bowel sounds normal; no masses,  no organomegaly Extremities: extremities normal, atraumatic, no cyanosis or edema  Lab Results: No results found for this basename: WBC, HGB, PLT,  in the last 72 hours No results found for this basename: NA, K, CL, CO2, GLUCOSE, BUN, CREATININE,  in the last 72 hours No results found for this basename: TROPONINI, CK, MB,  in the last 72 hours Hepatic Function Panel No results found for this basename: PROT, ALBUMIN, AST, ALT, ALKPHOS, BILITOT, BILIDIR, IBILI,  in the last 72 hours No results found for this basename: CHOL,  in the last 72 hours No results found for this basename: PROTIME,  in the last 72 hours  Imaging: Imaging results have been reviewed and No results found.  Cardiac Studies:  Assessment/Plan:  Resolving Escherichia coli UTI  Schizoaffective disorder  Hypertension  Hypothyroidism  COPD  History of congestive heart failure secondary to diastolic dysfunction  Morbid obesity  Obesity hypoventilation syndrome/obstructive  sleep apnea  History of recurrent UTI in the past  GERD  Status post Atrial flutter with moderate ventricular response  History of tobacco abuse  History of questionable seizure disorder  Hiatus hernia  Plan DC IV antibiotics Start Keflex as per orders Awaiting skilled nursing facility  LOS: 7 days    Jasmene Goswami N 02/28/2013, 11:28 AM

## 2013-02-28 NOTE — Progress Notes (Signed)
Patient still adamantly refusing IV stick for saline lock/venous access. Will notify night shift RN. Angelena Form, RN

## 2013-03-01 DIAGNOSIS — N390 Urinary tract infection, site not specified: Principal | ICD-10-CM

## 2013-03-01 MED ORDER — NICOTINE 14 MG/24HR TD PT24
14.0000 mg | MEDICATED_PATCH | Freq: Every day | TRANSDERMAL | Status: DC
  Administered 2013-03-01 – 2013-03-04 (×4): 14 mg via TRANSDERMAL
  Filled 2013-03-01 (×4): qty 1

## 2013-03-01 MED ORDER — PALIPERIDONE ER 6 MG PO TB24: 6.0000 mg | ORAL_TABLET | Freq: Every day | ORAL | Status: DC

## 2013-03-01 MED ORDER — FLUCONAZOLE 100 MG PO TABS
100.0000 mg | ORAL_TABLET | Freq: Every day | ORAL | Status: DC
  Administered 2013-03-01 – 2013-03-04 (×4): 100 mg via ORAL
  Filled 2013-03-01 (×4): qty 1

## 2013-03-01 MED ORDER — RISPERIDONE 0.5 MG PO TABS
0.5000 mg | ORAL_TABLET | Freq: Two times a day (BID) | ORAL | Status: DC
  Administered 2013-03-02 – 2013-03-04 (×5): 0.5 mg via ORAL
  Filled 2013-03-01 (×6): qty 1

## 2013-03-01 MED ORDER — PALIPERIDONE ER 3 MG PO TB24
3.0000 mg | ORAL_TABLET | Freq: Once | ORAL | Status: AC
  Administered 2013-03-01: 3 mg via ORAL
  Filled 2013-03-01: qty 1

## 2013-03-01 MED ORDER — NICOTINE POLACRILEX 2 MG MT GUM
2.0000 mg | CHEWING_GUM | OROMUCOSAL | Status: DC | PRN
  Administered 2013-03-01: 2 mg via ORAL
  Filled 2013-03-01: qty 1

## 2013-03-01 NOTE — Progress Notes (Signed)
CSW received call from Engineer, production. She will come see patient this evening.  Arshad Oberholzer C. Seena Face MSW, LCSW [phone]

## 2013-03-01 NOTE — Progress Notes (Signed)
Subjective:  Patient denies any chest pain or shortness of breath patient continues to have episodes of the delusions confusions and agitation. Seen by psych again suggested to start risperidone  Objective:  Vital Signs in the last 24 hours: Temp:  [98 F (36.7 C)-98.9 F (37.2 C)] 98 F (36.7 C) (09/12 1422) Pulse Rate:  [68-87] 73 (09/12 1422) Resp:  [18-20] 18 (09/12 1422) BP: (112-130)/(75-85) 130/85 mmHg (09/12 1422) SpO2:  [95 %-98 %] 97 % (09/12 1422)  Intake/Output from previous day: 09/11 0701 - 09/12 0700 In: 240 [P.O.:240] Out: -  Intake/Output from this shift:    Physical Exam: Neck: no adenopathy, no carotid bruit, no JVD and supple, symmetrical, trachea midline Lungs: clear to auscultation bilaterally Heart: regular rate and rhythm, S1, S2 normal, no murmur, click, rub or gallop Abdomen: soft, non-tender; bowel sounds normal; no masses,  no organomegaly Extremities: extremities normal, atraumatic, no cyanosis or edema  Lab Results: No results found for this basename: WBC, HGB, PLT,  in the last 72 hours No results found for this basename: NA, K, CL, CO2, GLUCOSE, BUN, CREATININE,  in the last 72 hours No results found for this basename: TROPONINI, CK, MB,  in the last 72 hours Hepatic Function Panel No results found for this basename: PROT, ALBUMIN, AST, ALT, ALKPHOS, BILITOT, BILIDIR, IBILI,  in the last 72 hours No results found for this basename: CHOL,  in the last 72 hours No results found for this basename: PROTIME,  in the last 72 hours  Imaging: Imaging results have been reviewed and No results found.  Cardiac Studies:  Assessment/Plan:  Resolving Escherichia coli UTI  Schizoaffective disorder  Hypertension  Hypothyroidism  COPD  History of congestive heart failure secondary to diastolic dysfunction  Morbid obesity  Obesity hypoventilation syndrome/obstructive sleep apnea  History of recurrent UTI in the past  GERD  Status post Atrial flutter  with moderate ventricular response  History of tobacco abuse  History of questionable seizure disorder  Hiatus hernia  Plan As per orders Dr. Algie Coffer on-call for weekend Awaiting skilled nursing facility  LOS: 8 days    [name] N 03/01/2013, 5:57 PM

## 2013-03-01 NOTE — Progress Notes (Signed)
Around 1955, pt began to yell inappropriately demanding that this RN bring her papers to sign herself out. Upon arrival to the room, pt informed this RN that she "had been murdered." Attempted to reorient pt and reassured her that she was in the hospital and safe. This pt is clearly delusional so wouldn't be able to sign self out AMA. Informed pt also that I would need to notify the physician. This RN inadvertently paged [facility] to come to the floor to talk to the patient. Orders received for IM Ativan and Haldol and to repeat EKG in am. Nursing supervisor, Gabriel Rung, made aware as well as charge nurse. Dr. Sharyn Lull, pt's attending, was also notified and informed of the hospitalist's recommendation with regard to meds and getting Psych MD involved. Dr. Sharyn Lull agreed with IM Haldol but did not want pt to receive Ativan, so that order was d/c'ed. Will continue to monitor pt throughout the night.

## 2013-03-01 NOTE — Progress Notes (Signed)
Pt has been resting quietly since receiving prn meds last night. Did get agitated when providing incontinence care but calmed down pretty quickly. Pt continues to refuse IV restart. Will attempt later this morning when pt becomes more awake. Sitter at bedside at this time.

## 2013-03-01 NOTE — Progress Notes (Signed)
Clinical Social Work  CSW met with patient at bedside. Patient was upset and yelling that she wants to go home. CSW and Director of [place] met with patient for lengthy discussion at bedside.  CSW explained that information has not been received from the state in order to go to SNF. Patient reports she has now changed her mind regarding wanting SNF and wants to return home. CSW and patient discussed safety concerns with patient returning home without proper care. Patient is tearful and yelling that she needs to go home to be "with her spirit" and reports her spirit is at home because she was murdered there.   Patient remains delusional and is difficult to redirect. Patient remains oriented at times and is able to recall information such as her admission date. Patient reports she has spoken to [facility] to move her bed into the front room so that he can care for her better. When attempts have been made to redirect patient or confront patient that she is not dead, patient becomes upset and starts crying. Patient continues to make statements about owning the hospital, being a doctor, being an Tree surgeon, and a Clinical research associate. Director provided patient with materials to draw and color during stay.  CSW made it clear that a discharge plan was not set and no time deadline could be set but that CSW would continue to support her during hospitalization. Unit CSW to work on SNF placement and pasarr.  RN reports that attending MD has agreed to increase [name] dose to 6 mg as recommended by psych MD.  CSW will continue to follow.  [place], [place] [phone]

## 2013-03-01 NOTE — Consult Note (Signed)
[facility] Face-to-Face Psychiatry Consult   Reason for Consult:  Psychosis Referring Physician:  Dr. Evlyn Courier is an 62 y.o. female.  Assessment: AXIS I:  Schizoaffective Disorder AXIS II:  Deferred AXIS III:   Past Medical History  Diagnosis Date  . Schizoaffective disorder   . Urinary tract infection   . Hypertension   . GERD (gastroesophageal reflux disease)   . Hypothyroidism   . Obesity   . Asthma   . COPD (chronic obstructive pulmonary disease)   . CHF (congestive heart failure)   . Seizures   . Morbid obesity   . Chronic pain   . Hypercholesteremia   . Anginal pain   . Myocardial infarction 2002  . Pneumonia     "several times" (05/23/2012)  . Diabetes mellitus     "I'm not diabetic anymore" (05/23/2012)  . Anemia   . History of blood transfusion 1993    "when I had my hysterectomy" (05/23/2012)  . H/O hiatal hernia   . Daily headache   . Arthritis     "severe; all over my body" (05/23/2012)  . Hypoventilation syndrome     [name] 05/30/2012  . Atrial fibrillation   . Atrial flutter, paroxysmal     [name] 05/30/2012  . Exertional dyspnea   . Shortness of breath     "all the time lately" (05/30/2012)   AXIS IV:  problems related to social environment, problems with access to health care services and problems with primary support group AXIS V:  31-40 impairment in reality testing  Plan:  No evidence of imminent risk to self or others at present.   Medication management  Subjective:   [name] is a 62 y.o. female patient admitted with change in mental status/UTI.Marland Kitchen  HPI:  Patient seen chart reviewed.  The patient remains very delusional psychotic and inappropriate.  She required Haldol IM last night for agitation and combative behavior.  She continued to insist that she has given a lot of talent from the [employer] and remains confused at times.  Her [name] was increased to 6 mg today.  Patient demanding to be discharged however there is no  understanding.  She remains very labile irritable angry and combative.  She requires excessive redirection and reminder.  In the past he had a good response with Risperdal however it has been discontinued by her psychiatrist.  Patient do not remember why it was discontinued.  I spoke to her physician Dr. Sharyn Lull who told the patient has given [name] 6 mg last time and she became very sedated.  Patient has multiple hospitalizations in the past due to psychosis and paranoid behavior.    HPI Elements:   Location:  Medical floor. Quality:  Fair. Severity:  Unable to function.  Past Psychiatric History: Past Medical History  Diagnosis Date  . Schizoaffective disorder   . Urinary tract infection   . Hypertension   . GERD (gastroesophageal reflux disease)   . Hypothyroidism   . Obesity   . Asthma   . COPD (chronic obstructive pulmonary disease)   . CHF (congestive heart failure)   . Seizures   . Morbid obesity   . Chronic pain   . Hypercholesteremia   . Anginal pain   . Myocardial infarction 2002  . Pneumonia     "several times" (05/23/2012)  . Diabetes mellitus     "I'm not diabetic anymore" (05/23/2012)  . Anemia   . History of blood transfusion 1993    "when I had  my hysterectomy" (05/23/2012)  . H/O hiatal hernia   . Daily headache   . Arthritis     "severe; all over my body" (05/23/2012)  . Hypoventilation syndrome     [name] 05/30/2012  . Atrial fibrillation   . Atrial flutter, paroxysmal     [name] 05/30/2012  . Exertional dyspnea   . Shortness of breath     "all the time lately" (05/30/2012)    reports that she quit smoking about 12 years ago. Her smoking use included Cigarettes. She has a 24 pack-year smoking history. Her smokeless tobacco use includes Snuff and Chew. She reports that she does not drink alcohol or use illicit drugs. Family History  Problem Relation Age of Onset  . Heart disease Father   . Cancer Mother 49    Breast     Living Arrangements: Alone    Abuse/Neglect [facility]) Physical Abuse: Denies Verbal Abuse: Denies Sexual Abuse: Denies Allergies:   Allergies  Allergen Reactions  . Food Rash    "lasagna"= rash from the noodles  . Penicillins Hives  . Sulfamethoxazole W-Trimethoprim Hives and Nausea And Vomiting    ACT Assessment Complete:  No:   Past Psychiatric History: Patient has multiple psychiatric hospitalization due to psychosis and suicidal attempt.  She has been seen by act in the past.  As per chronic medical record she is on Risperdal and, Haldol and Seroquel in the past. Place of Residence:  Unknown Marital Status:  Unknown Employed/Unemployed:  Unemployed Education:  Unknown Family Supports:  Unknown Objective: Blood pressure 130/85, pulse 73, temperature 98 F (36.7 C), temperature source Oral, resp. rate 18, height 5' (1.524 m), weight 267 lb 8 oz (121.337 kg), SpO2 97.00%.Body mass index is 52.24 kg/(m^2). No results found for this or any previous visit (from the past 72 hour(s)). Labs are reviewed and are pertinent for WBC count and urinalysis is positive for ketones and nitrites.  Current Facility-Administered Medications  Medication Dose Route Frequency Provider Last Rate Last Dose  . 0.9 %  sodium chloride infusion   Intravenous Continuous [name], MD 20 mL/hr at 02/26/13 0654    . albuterol (PROVENTIL HFA;VENTOLIN HFA) 108 (90 BASE) MCG/ACT inhaler 2 puff  2 puff Inhalation Q6H PRN [name], MD      . aspirin EC tablet 81 mg  81 mg Oral q morning - 10a [name], MD   81 mg at 03/01/13 1018  . budesonide (PULMICORT) nebulizer solution 0.25 mg  0.25 mg Nebulization BID [name], MD   0.25 mg at 02/28/13 2031  . cephALEXin (KEFLEX) capsule 500 mg  500 mg Oral Q8H [name], MD   500 mg at 03/01/13 1544  . divalproex (DEPAKOTE ER) 24 hr tablet 250 mg  250 mg Oral QHS [name], MD   250 mg at 02/28/13 2312  . enoxaparin (LOVENOX) injection 60 mg  0.5 mg/kg Subcutaneous Q24H  Teressa Lower, RPH   60 mg at 02/28/13 2311  . fluconazole (DIFLUCAN) tablet 100 mg  100 mg Oral Daily [name], MD   100 mg at 03/01/13 1227  . haloperidol lactate (HALDOL) injection 5 mg  5 mg Intramuscular Q6H PRN Russella Dar, NP   5 mg at 03/01/13 1821   Or  . haloperidol lactate (HALDOL) injection 1 mg  1 mg Intravenous Q6H PRN Russella Dar, NP      . hydrocortisone cream 1 %   Topical BID Eduardo Osier  Harwani, MD      . levothyroxine (SYNTHROID, LEVOTHROID) tablet 150 mcg  150 mcg Oral QAC breakfast [name], MD   150 mcg at 03/01/13 0942  . metoprolol (LOPRESSOR) tablet 50 mg  50 mg Oral BID [name], MD   50 mg at 03/01/13 1018  . nicotine (NICODERM CQ - dosed in mg/24 hours) patch 14 mg  14 mg Transdermal Daily [name], MD   14 mg at 03/01/13 1109  . nicotine polacrilex (NICORETTE) gum 2 mg  2 mg Oral PRN [name], MD   2 mg at 03/01/13 1830  . nitroGLYCERIN (NITROSTAT) SL tablet 0.4 mg  0.4 mg Sublingual Q5 min PRN [name], MD      . pantoprazole (PROTONIX) EC tablet 40 mg  40 mg Oral Daily [name], MD   40 mg at 03/01/13 1018  . polyethylene glycol (MIRALAX / GLYCOLAX) packet 17 g  17 g Oral 2 times weekly [name], MD   17 g at 02/25/13 1203  . [START ON 03/02/2013] risperiDONE (RISPERDAL) tablet 0.5 mg  0.5 mg Oral BID [name], MD      . traMADol Janean Sark) tablet 50 mg  50 mg Oral Q8H PRN [name], MD   50 mg at 02/27/13 1437    Psychiatric Specialty Exam:     Blood pressure 130/85, pulse 73, temperature 98 F (36.7 C), temperature source Oral, resp. rate 18, height 5' (1.524 m), weight 267 lb 8 oz (121.337 kg), SpO2 97.00%.Body mass index is 52.24 kg/(m^2).  General Appearance: Casual  Eye Contact::  Fair  Speech:  Slow and Slurred  Volume:  Decreased  Mood:  Anxious  Affect:  Labile  Thought Process:  Irrelevant  Orientation:  Full (Time, Place, and Person)  Thought Content:  Delusions,  Hallucinations: Auditory, Paranoid Ideation and Rumination  Suicidal Thoughts:  No  Homicidal Thoughts:  No  Memory:  poor  Judgement:  Impaired  Insight:  Lacking  Psychomotor Activity:  Decreased  Concentration:  Poor  Recall:  Poor  Akathisia:  No  Handed:  Right  AIMS (if indicated):     Assets:  Housing  Sleep:      Treatment Plan Summary: Medication management Patient remains paranoid , grandiose and delusional.  If [name] 6 mg does not help then discontinue and try Risperdal 1 mg twice a day if not medically contraindicated.  Patient has a good response with Risperdal in the past.  Continue Haldol 2 mg IM every 6 hours when necessary for severe agitation.  Monitor QTC EKG, EPS and call consultation liaison services at [phone] if there is any question.  Plan discuss with Dr. Sharyn Lull.   Naw Lasala T. 03/01/2013 7:04 PM

## 2013-03-02 NOTE — Progress Notes (Signed)
03/02/13 1500  PT Visit Information  Last PT Received On 03/02/13  Reason Eval/Treat Not Completed Other (comment) (pt just had meds and calmed down; ck back tomorrow)

## 2013-03-02 NOTE — Progress Notes (Signed)
Subjective:  Feeling better. Ready to go home. Confused and agitated per nurse.  Objective:  Vital Signs in the last 24 hours: Temp:  [98 F (36.7 C)-98.4 F (36.9 C)] 98.4 F (36.9 C) (09/13 0505) Pulse Rate:  [73-87] 80 (09/13 0931) Cardiac Rhythm:  [-]  Resp:  [16-18] 16 (09/13 0505) BP: (103-130)/(68-85) 110/68 mmHg (09/13 0931) SpO2:  [97 %-99 %] 97 % (09/13 0505)  Physical Exam: BP Readings from Last 1 Encounters:  03/02/13 110/68     Wt Readings from Last 1 Encounters:  02/22/13 121.337 kg (267 lb 8 oz)    Weight change:   HEENT: [place]/AT, Eyes-Light brown, PERL, EOMI, Conjunctiva-Pink, Sclera-Non-icteric Neck: No JVD, No bruit, Trachea midline. Lungs:  Clear, Bilateral. Cardiac:  Regular rhythm, normal S1 and S2, no S3.  Abdomen:  Soft, non-tender. Extremities:  Trace edema present. No cyanosis. No clubbing. CNS: AxOx3, Cranial nerves grossly intact, moves all 4 extremities. Right handed. Skin: Warm and dry.   Intake/Output from previous day: 09/12 0701 - 09/13 0700 In: 1080 [P.O.:1080] Out: -     Lab Results: BMET    Component Value Date/Time   NA 136 02/23/2013 0552   K 3.9 02/23/2013 0552   CL 102 02/23/2013 0552   CO2 25 02/23/2013 0552   GLUCOSE 106* 02/23/2013 0552   BUN 7 02/23/2013 0552   CREATININE 0.64 02/23/2013 0552   CALCIUM 10.1 02/23/2013 0552   GFRNONAA >90 02/23/2013 0552   GFRAA >90 02/23/2013 0552   CBC    Component Value Date/Time   WBC 10.3 02/23/2013 0552   RBC 3.81* 02/23/2013 0552   RBC 2.70* 05/19/2011 0632   HGB 12.5 02/23/2013 0552   HCT 38.6 02/23/2013 0552   PLT 264 02/23/2013 0552   MCV 101.3* 02/23/2013 0552   MCH 32.8 02/23/2013 0552   MCHC 32.4 02/23/2013 0552   RDW 14.1 02/23/2013 0552   LYMPHSABS 2.0 02/21/2013 1945   MONOABS 0.8 02/21/2013 1945   EOSABS 0.1 02/21/2013 1945   BASOSABS 0.0 02/21/2013 1945   CARDIAC ENZYMES Lab Results  Component Value Date   CKTOTAL 137 02/21/2013   CKMB 1.7 06/03/2012   TROPONINI <0.30 08/15/2012    Scheduled  Meds: . aspirin EC  81 mg Oral q morning - 10a  . budesonide  0.25 mg Nebulization BID  . cephALEXin  500 mg Oral Q8H  . divalproex  250 mg Oral QHS  . enoxaparin (LOVENOX) injection  0.5 mg/kg Subcutaneous Q24H  . fluconazole  100 mg Oral Daily  . hydrocortisone cream   Topical BID  . levothyroxine  150 mcg Oral QAC breakfast  . metoprolol tartrate  50 mg Oral BID  . nicotine  14 mg Transdermal Daily  . pantoprazole  40 mg Oral Daily  . polyethylene glycol  17 g Oral 2 times weekly  . risperiDONE  0.5 mg Oral BID   Continuous Infusions: . sodium chloride 20 mL/hr at 02/26/13 0654   PRN Meds:.albuterol, haloperidol lactate, haloperidol lactate, nicotine polacrilex, nitroGLYCERIN, traMADol  Assessment/Plan: Resolving Escherichia coli UTI  Schizoaffective disorder  Hypertension  Hypothyroidism  COPD  History of congestive heart failure secondary to diastolic dysfunction  Morbid obesity  Obesity hypoventilation syndrome/obstructive sleep apnea  History of recurrent UTI in the past  GERD  Status post Atrial flutter with moderate ventricular response  History of tobacco abuse  History of questionable seizure disorder  Hiatus hernia  Plan  Continue medical treatment.   LOS: 9 days    [name]  Algie Coffer  MD  03/02/2013, 10:05 AM

## 2013-03-03 NOTE — Evaluation (Signed)
Physical Therapy Evaluation Patient Details Name: [name] MRN: [id number] DOB: 1951-03-04 Today's Date: 03/03/2013 Time: 4098-1191 PT Time Calculation (min): 15 min  PT Assessment / Plan / Recommendation History of Present Illness  Patient is 62 year old female with past medical history significant for multiple medical problems i.e. hypertension, hypothyroidism, COPD, history of congestive heart failure secondary to diastolic dysfunction in the past, morbid obesity, obesity hypoventilation syndrome/obstructive sleep apnea, history of schizoaffective disorder, history of UTI in the past, and GERD, history of paroxysmal atrial flutter, history of tobacco abuse, history of questionable seizure disorder, height is hernia, [facility] signed out AMA approximately one week was brought to the ER by EMS because of inability to ambulate and low-grade fever and incontinence of feces and urine and was noted to have UTI.    Clinical Impression  PT will sign off at this time; this is the second time we have evaluated  pt on this admission; She is unable/unwilling  to participate with PT; She is not  a rehab candidate at this time; She cannt be reasoned with to participate with PT due to her mental capacity/hx; Recommend long term care facility placement or if home she would need hospital bed and hoyer lift as well as 24hr assist;     PT Assessment  Patent does not need any further PT services    Follow Up Recommendations  No PT follow up    Does the patient have the potential to tolerate intense rehabilitation      Barriers to Discharge        Equipment Recommendations  Other (comment) (hoyer lift if pt D/C's home)    Recommendations for Other Services     Frequency      Precautions / Restrictions Precautions Precautions: Fall Restrictions Weight Bearing Restrictions: No   Pertinent Vitals/Pain       Mobility  Bed Mobility Bed Mobility: Not assessed Details for  Bed Mobility Assistance: pt screaming and would not allow PT to assist her; RN spoke to pt to calm and encourage her, pt continued to scream Transfers Transfers: Not assessed Ambulation/Gait Ambulation/Gait Assistance: Not tested (comment)    Exercises     PT Diagnosis:    PT Problem List:   PT Treatment Interventions:       PT Goals(Current goals can be found in the care plan section)    Visit Information  Last PT Received On: 03/03/13 History of Present Illness: Patient is 62 year old female with past medical history significant for multiple medical problems i.e. hypertension, hypothyroidism, COPD, history of congestive heart failure secondary to diastolic dysfunction in the past, morbid obesity, obesity hypoventilation syndrome/obstructive sleep apnea, history of schizoaffective disorder, history of UTI in the past, and GERD, history of paroxysmal atrial flutter, history of tobacco abuse, history of questionable seizure disorder, height is hernia, [facility] signed out AMA approximately one week was brought to the ER by EMS because of inability to ambulate and low-grade fever and incontinence of feces and urine and was noted to have UTI.         Prior Functioning  Home Living Family/patient expects to be discharged to:: Unsure Living Arrangements: Alone Additional Comments: pt left AMA from [facility] via ambulance, she states she does not know how they moved her or where they left her at home b/c she was "asleep" Prior Function Comments: Pt states she is "paralyzed" and can't sit up    Cognition  Cognition Arousal/Alertness: Awake/alert Overall Cognitive Status:  Impaired/Different from baseline Area of Impairment: Attention;Memory;Following commands;Safety/judgement;Awareness;Problem solving Memory: Decreased recall of precautions;Decreased short-term memory Safety/Judgement: Decreased awareness of safety;Decreased awareness of deficits General Comments:  unable to reason with pt regarding mobility; pt continues to scream incessantly that she can't move; she does clearly state that she wants PT to leave  and that she needs a "hall lift" (a hoyer lift is what she is referring to)    Extremity/Trunk Assessment Upper Extremity Assessment Upper Extremity Assessment: Difficult to assess due to impaired cognition Lower Extremity Assessment Lower Extremity Assessment: Difficult to assess due to impaired cognition RLE Deficits / Details: Pt cries out loudly whenever legs are touched. she has minimal movement of right dorsi/plantaflexion, pt continued to cry out loudly Not able to assess ROM or strength; pt screams when PT looks at her LEs RLE: Unable to fully assess due to pain LLE Deficits / Details: Leg resting in external rotation with slight knee flexion.  Pt would not allow leg to be touched LLE: Unable to fully assess due to pain Cervical / Trunk Assessment Cervical / Trunk Exceptions: pt with obese abdomen and  appears to have generalized muscle atrophy   Balance Balance Balance Assessed: No (unable to assess due to pain)  End of Session PT - End of Session Activity Tolerance: Patient limited by pain (limited by mental issues) Patient left: in bed Nurse Communication: Mobility status  GP     [facility] 03/03/2013, 1:59 PM

## 2013-03-03 NOTE — Progress Notes (Signed)
Subjective:  Feeling same. Afebrile. Wants to go home.  Objective:  Vital Signs in the last 24 hours: Temp:  [97.6 F (36.4 C)-98.2 F (36.8 C)] 98 F (36.7 C) (09/14 0659) Pulse Rate:  [71-73] 71 (09/14 0659) Cardiac Rhythm:  [-]  Resp:  [18] 18 (09/14 0659) BP: (107-129)/(71-79) 116/79 mmHg (09/14 0659) SpO2:  [94 %-98 %] 94 % (09/14 0853)  Physical Exam: BP Readings from Last 1 Encounters:  03/03/13 116/79     Wt Readings from Last 1 Encounters:  02/22/13 121.337 kg (267 lb 8 oz)    Weight change:   HEENT: [place]/AT, Eyes-Light brown, PERL, EOMI, Conjunctiva-Pink, Sclera-Non-icteric Neck: No JVD, No bruit, Trachea midline. Lungs:  Clear, Bilateral. Cardiac:  Regular rhythm, normal S1 and S2, no S3.  Abdomen:  Soft, non-tender. Extremities:  Trace to 1 + edema present. No cyanosis. No clubbing. CNS: AxOx3, Cranial nerves grossly intact, moves all 4 extremities. Right handed. Skin: Warm and dry.   Intake/Output from previous day: 09/13 0701 - 09/14 0700 In: 1140 [P.O.:1140] Out: -     Lab Results: BMET    Component Value Date/Time   NA 136 02/23/2013 0552   K 3.9 02/23/2013 0552   CL 102 02/23/2013 0552   CO2 25 02/23/2013 0552   GLUCOSE 106* 02/23/2013 0552   BUN 7 02/23/2013 0552   CREATININE 0.64 02/23/2013 0552   CALCIUM 10.1 02/23/2013 0552   GFRNONAA >90 02/23/2013 0552   GFRAA >90 02/23/2013 0552   CBC    Component Value Date/Time   WBC 10.3 02/23/2013 0552   RBC 3.81* 02/23/2013 0552   RBC 2.70* 05/19/2011 0632   HGB 12.5 02/23/2013 0552   HCT 38.6 02/23/2013 0552   PLT 264 02/23/2013 0552   MCV 101.3* 02/23/2013 0552   MCH 32.8 02/23/2013 0552   MCHC 32.4 02/23/2013 0552   RDW 14.1 02/23/2013 0552   LYMPHSABS 2.0 02/21/2013 1945   MONOABS 0.8 02/21/2013 1945   EOSABS 0.1 02/21/2013 1945   BASOSABS 0.0 02/21/2013 1945   CARDIAC ENZYMES Lab Results  Component Value Date   CKTOTAL 137 02/21/2013   CKMB 1.7 06/03/2012   TROPONINI <0.30 08/15/2012    Scheduled Meds: . aspirin EC  81  mg Oral q morning - 10a  . budesonide  0.25 mg Nebulization BID  . cephALEXin  500 mg Oral Q8H  . divalproex  250 mg Oral QHS  . enoxaparin (LOVENOX) injection  0.5 mg/kg Subcutaneous Q24H  . fluconazole  100 mg Oral Daily  . hydrocortisone cream   Topical BID  . levothyroxine  150 mcg Oral QAC breakfast  . metoprolol tartrate  50 mg Oral BID  . nicotine  14 mg Transdermal Daily  . pantoprazole  40 mg Oral Daily  . polyethylene glycol  17 g Oral 2 times weekly  . risperiDONE  0.5 mg Oral BID   Continuous Infusions: . sodium chloride 20 mL/hr at 02/26/13 0654   PRN Meds:.albuterol, haloperidol lactate, haloperidol lactate, nicotine polacrilex, nitroGLYCERIN, traMADol  Assessment/Plan: Resolving Escherichia coli UTI  Schizoaffective disorder  Hypertension  Hypothyroidism  COPD  History of congestive heart failure secondary to diastolic dysfunction  Morbid obesity  Obesity hypoventilation syndrome/obstructive sleep apnea  History of recurrent UTI in the past  GERD  Status post Atrial flutter with moderate ventricular response  History of tobacco abuse  History of questionable seizure disorder  Hiatus hernia   Continue medical treatment. SNF soon    LOS: 10 days  Orpah Cobb  MD  03/03/2013, 11:01 AM

## 2013-03-04 MED ORDER — RISPERIDONE 0.5 MG PO TABS: 0.5000 mg | ORAL_TABLET | Freq: Two times a day (BID) | ORAL | Status: DC

## 2013-03-04 NOTE — Progress Notes (Signed)
[facility]. CSW met with patient. Patient is very happy [facility] be [facility]. She understands it is the building on [address]. Packet copied and placed in [place]. ptar called for transportation. CSW called patient's poa, clarence maxwell, informed him of transfer. He is glad that patient will be able to receive the care she needs.  Victorian Gunn C. Issaiah Seabrooks MSW, LCSW [phone]

## 2013-03-04 NOTE — Progress Notes (Signed)
Telephone report given to [facility] [place].  Ambulance called for ride to this location approximately 1600

## 2013-03-04 NOTE — Progress Notes (Signed)
passar level 2 received, it is good for 60 days. [facility] can not take patient due to it not being a lifetime level 2. CSW faxed out patient. [name] offer. Then they called CSW and stated that they can not accept the patient as they realized that patient owes them 17,000 dollars. CSW called patient's POA, [name], he is agreeable to if neither [facility] can take patient, then he is okay with patient going to [facility].  Khristian Seals C. Yvana Samonte MSW, LCSW [phone]

## 2013-03-04 NOTE — Discharge Summary (Signed)
  The discharge summary dictated on 03/04/2013 dictation [phone]

## 2013-03-04 NOTE — Progress Notes (Signed)
Phoned medical records to request d/c summary that was dictated could be transcribed today.  Notified Toma Copier, CSW

## 2013-03-04 NOTE — Progress Notes (Signed)
OT Cancellation Note  Patient Details Name: [name] MRN: [id number] DOB: 01-10-1951   Cancelled Treatment:    Reason Eval/Treat Not Completed: Other (comment) Note pt has been bed bound, not able to participate with physical therapy, has needed care PTA and needs long term care. Sign off, not appropriate for acute OT.  Lennox Laity [phone] 03/04/2013, 1:01 PM

## 2013-03-04 NOTE — Discharge Summary (Signed)
NAMEALLICIA, [name]               ACCOUNT NO.:  [id number]  MEDICAL RECORD NO.:  [id number]  LOCATION:  1506                         FACILITY:  [facility]  PHYSICIAN:  Vinal Rosengrant N. Sharyn [name], M.D. DATE OF BIRTH:  06-Jul-1950  DATE OF ADMISSION:  02/21/2013 DATE OF DISCHARGE:  03/04/2013                              DISCHARGE SUMMARY   ADMITTING DIAGNOSES: 1. Altered mental status, probably secondary to urinary tract     infection. 2. Schizoaffective disorder. 3. Hypertension. 4. Hypothyroidism. 5. Chronic obstructive pulmonary disease. 6. History of congestive heart failure secondary to diastolic     dysfunction. 7. Morbid obesity. 8. Obesity hypoventilation syndrome/obstructive sleep apnea. 9. History of recurrent urinary tract infection in the past. 10.Gastroesophageal reflux disease. 11.History of atrial flutter in the past. 12.History of tobacco abuse. 13.History of questionable seizure disorder. 14.Hiatus hernia.  DISCHARGE DIAGNOSES: 1. Status post Escherichia coli urinary tract infection. 2. Status post altered mental status, confusion. 3. Schizoaffective disorder. 4. Hypertension. 5. Hypothyroidism. 6. Chronic obstructive pulmonary disease. 7. History of congestive heart failure secondary to diastolic     dysfunction. 8. Morbid obesity. 9. Obesity. 10.Hypoventilation syndrome/obstructive sleep apnea. 11.History of recurrent urinary tract infection in the past. 12.Gastroesophageal reflux disease. 13.History of paroxysmal atrial flutter in the past. 14.History of tobacco abuse. 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 16.Hiatus hernia.  DISCHARGE MEDICATIONS:  Risperdal 0.5 mg 1 tablet twice daily.  Continue rest of the medicines i.e. 1. Albuterol inhaler 2 puffs every 6 hours as needed. 2. Aspirin 81 mg 1 tablet daily. 3. Pulmicort nebulizer twice daily as before. 4. Depakote ER 250 mg at bedtime as before. 5. Levothyroxine 150 mcg daily. 6. Metoprolol  succinate 50 mg daily. 7. Nitrostat 0.4 mg sublingual use as directed. 8. Omeprazole 20 mg every morning. 9. MiraLAX 17 g twice a week as before. 10.Tramadol 50 mg 4 times daily as before.  The patient has been advised to stop nasal spray, lorazepam, [name]  DIET:  Low salt, low cholesterol, weight reducing diet.  ACTIVITY:  As tolerated.  CONDITION AT DISCHARGE:  Stable.  FOLLOWUP:  Follow up with me in 2 weeks.  Follow up with PMD as scheduled.  CONDITION ON DISCHARGE:  Stable.  The patient will be transported to [facility] via ambulance.  BRIEF HISTORY:  [name] is a 62 year old female with past medical history significant for multiple medical problems, i.e., hypertension, hypothyroidism, COPD, history of congestive heart failure secondary to diastolic dysfunction in the past, morbid obesity, hypoventilation syndrome/obstructive sleep apnea, history of schizoaffective disorder, history of UTI in the past, GERD, history of paroxysmal atrial flutter, history of tobacco abuse, history of questionable seizure disorder, hiatus hernia, resident of [address] [facility].  Signed out AMA approximately 1 week ago, and was brought to the ER by EMS because of inability to ambulate and low grade fever, incontinence of feces and urine and was noted to have UTI.  The patient denies any abdominal pain, urinary complaints, denies any chest pain or shortness of breath.  Denies any cough but complains of fever and chills.  Denies any seizure activity.  PHYSICAL EXAMINATION:  VITAL SIGNS:  Blood pressure was 113/73, pulse was 116, temperature was 100.8. HEENT:  Conjunctivae was pink. NECK:  Supple.  No JVD.  No bruit.  No stiffness. LUNGS:  Clear to auscultation without rhonchi or rales. CARDIOVASCULAR:  S1, S2 was normal.  There was no systolic murmur or S3 gallop. ABDOMEN:  Soft, obese.  Bowel sounds were present.  Nontender. EXTREMITIES:  There is no clubbing, cyanosis, or  edema.  LABORATORY DATA:  Sodium was 139, potassium 3.8, BUN was 9, creatinine 0.59.  Troponin-I was negative, lactic acid level was 2.09, hemoglobin was 13.2, hematocrit 40, white count of 11.6.  Urine culture grew E. Coli, which was resistant to ciprofloxacin, but sensitive to Rocephin. Blood cultures were negative.  Duplex ultrasound of lower extremities were negative for DVT.  BRIEF HOSPITAL COURSE:  The patient was admitted to regular floor.  The patient was started on IV antibiotics, IV Cipro which was switched to IV Primaxin as patient was allergic to penicillin.  The patient had E. coli in the urine which was resistant to ciprofloxacin.  Primaxin was then switched to IV Rocephin as the patient has received Rocephin in the past without any side effects.  The patient had multiple episodes of confusion, delusion, and agitation.  Psych consultation was called, and recommendation was to switch [name] to Risperdal, which the patient is tolerating well.  The patient is more alert, awake, oriented x3, and is willing to go to [facility].  The patient initially wanted to go home, discussed with her fiancee who suggested that he could not take care of her at home and she should go back to [facility].  The patient will be discharged to [facility] today and will be followed up by me in 2 weeks or as needed and also by PMD, as scheduled.     Jennifer Osier. Sharyn [name], M.D.     MNH/MEDQ  D:  03/04/2013  T:  03/04/2013  Job:  [id number]

## 2013-03-04 NOTE — Progress Notes (Signed)
Upon rounding on patient this morning, she is pleasant and states that she wants to leave from the hospital today and is fine with going to [facility] facility. She also states that she wants to leave to go there as soon as possible today. Patient is alert and oriented x 3 at this current time.

## 2013-03-05 ENCOUNTER — Encounter: Payer: Self-pay | Admitting: Internal Medicine

## 2013-03-05 ENCOUNTER — Non-Acute Institutional Stay (SKILLED_NURSING_FACILITY): Payer: Medicare Other | Admitting: Internal Medicine

## 2013-03-05 DIAGNOSIS — E039 Hypothyroidism, unspecified: Secondary | ICD-10-CM

## 2013-03-05 DIAGNOSIS — F250 Schizoaffective disorder, bipolar type: Secondary | ICD-10-CM

## 2013-03-05 DIAGNOSIS — N390 Urinary tract infection, site not specified: Secondary | ICD-10-CM

## 2013-03-05 DIAGNOSIS — I10 Essential (primary) hypertension: Secondary | ICD-10-CM

## 2013-03-05 DIAGNOSIS — F70 Mild intellectual disabilities: Secondary | ICD-10-CM

## 2013-03-05 DIAGNOSIS — F259 Schizoaffective disorder, unspecified: Secondary | ICD-10-CM

## 2013-03-05 NOTE — Progress Notes (Signed)
Patient ID: [name], female   DOB: 1951/02/17, 62 y.o.   MRN: [id number] Provider:  Gwenith Spitz. Renato Gails, D.O., C.M.D. Location:  [facility]  PCP: [name], MD  Code Status: full code   Allergies  Allergen Reactions  . Food Rash    "lasagna"= rash from the noodles  . Penicillins Hives  . Sulfamethoxazole W-Trimethoprim Hives and Nausea And Vomiting    Chief Complaint  Patient presents with  . Hospitalization Follow-up    new admission s/p hospitalization with UTI, delirium    HPI: 62 y.o. 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  She left [employer] against medical advice to return home with her boyfriend, [name].  As has happened all other times she's been sent home, she has wound up with the hospital within a week (ongoing problem over at least the past couple of years).  She has active psychosis and cannot take care of herself at home.  When seen today, she was upset that she didn't have her snuff.  She also now says she was told she is paralyzed and will never walk again (this was never said).  She has previously had delusions of being pregnant with triplets and of being [facility].  She asked me if she was pregnant when I examined her belly for bowel sounds.    ROS: Review of Systems  Constitutional: Negative for fever and chills.  HENT: Negative for congestion.   Eyes: Negative for blurred vision.  Respiratory: Negative for cough.   Cardiovascular: Negative for chest pain.  Gastrointestinal: Negative for heartburn, abdominal pain and constipation.  Genitourinary: Negative for dysuria.  Musculoskeletal: Negative for falls.  Skin: Negative for rash.  Neurological: Negative for dizziness.  Endo/Heme/Allergies: Does not bruise/bleed easily.  Psychiatric/Behavioral: Negative for  suicidal ideas.       Delusional     Past Medical History  Diagnosis Date  . Schizoaffective disorder   . Urinary tract infection   . Hypertension   . GERD (gastroesophageal reflux disease)   . Hypothyroidism   . Obesity   . Asthma   . COPD (chronic obstructive pulmonary disease)   . CHF (congestive heart failure)   . Seizures   . Morbid obesity   . Chronic pain   . Hypercholesteremia   . Anginal pain   . Myocardial infarction 2002  . Pneumonia     "several times" (05/23/2012)  . Diabetes mellitus     "I'm not diabetic anymore" (05/23/2012)  . Anemia   . History of blood transfusion 1993    "when I had my hysterectomy" (05/23/2012)  . H/O hiatal hernia   . Daily headache   . Arthritis     "severe; all over my body" (05/23/2012)  . Hypoventilation syndrome     [name] 05/30/2012  . Atrial fibrillation   . Atrial flutter, paroxysmal     [name] 05/30/2012  . Exertional dyspnea   . Shortness of breath     "all the time lately" (05/30/2012)   Past Surgical History  Procedure Laterality Date  . Vaginal hysterectomy  1993  . Tubal ligation  1998  . Cholecystectomy  ?2008  . Cardiac catheterization  2008   Social History:   reports that she quit smoking about 12 years ago. Her smoking use included Cigarettes. She has a 24 pack-year smoking history. Her smokeless tobacco use  includes Snuff and Chew. She reports that she does not drink alcohol or use illicit drugs.  Family History  Problem Relation Age of Onset  . Heart disease Father   . Cancer Mother 77    Breast    Medications: Patient's Medications  New Prescriptions   No medications on file  Previous Medications   ALBUTEROL (PROVENTIL HFA;VENTOLIN HFA) 108 (90 BASE) MCG/ACT INHALER    Inhale 2 puffs into the lungs every 6 (six) hours as needed for wheezing.   ASPIRIN EC 81 MG TABLET    Take 81 mg by mouth every morning.    BUDESONIDE (PULMICORT) 0.25 MG/2ML NEBULIZER SOLUTION    Take 0.25 mg by nebulization 2  (two) times daily.   DIVALPROEX (DEPAKOTE ER) 250 MG 24 HR TABLET    Take 250 mg by mouth at bedtime.   LEVOTHYROXINE (SYNTHROID, LEVOTHROID) 150 MCG TABLET    Take 150 mcg by mouth every morning. For thyroid disease.   METOPROLOL SUCCINATE (TOPROL-XL) 50 MG 24 HR TABLET    Take 50 mg by mouth daily. Take with or immediately following a meal.   NITROGLYCERIN (NITROSTAT) 0.4 MG SL TABLET    Place 0.4 mg under the tongue every 5 (five) minutes as needed for chest pain.   OMEPRAZOLE (PRILOSEC) 20 MG CAPSULE    Take 20 mg by mouth every morning.    POLYETHYLENE GLYCOL (MIRALAX / GLYCOLAX) PACKET    Take 17 g by mouth 2 (two) times a week. Tuesday and Friday   RISPERIDONE (RISPERDAL) 0.5 MG TABLET    Take 1 tablet (0.5 mg total) by mouth 2 (two) times daily.   TRAMADOL (ULTRAM) 50 MG TABLET    Take 1 tablet (50 mg total) by mouth 4 (four) times daily.  Modified Medications   No medications on file  Discontinued Medications   No medications on file     Physical Exam: Filed Vitals:   03/05/13 1004  BP: 116/73  Pulse: 68  Temp: 97.8 F (36.6 C)  Resp: 18  SpO2: 96%  Physical Exam  Constitutional: She is oriented to person, place, and time.  Morbidly obese white female seen resting in bed, left leg contracted at knee  HENT:  Head: Normocephalic and atraumatic.  Eyes: EOM are normal. Pupils are equal, round, and reactive to light.  Cardiovascular: Normal rate, regular rhythm, normal heart sounds and intact distal pulses.   Pulmonary/Chest: Breath sounds normal. No respiratory distress.  Abdominal: Soft. Bowel sounds are normal. She exhibits no distension. There is no tenderness.  Musculoskeletal: Normal range of motion. She exhibits no tenderness.  Neurological: She is alert and oriented to person, place, and time.  Psychiatric: She expresses inappropriate judgment.  delusional    Labs reviewed: Basic Metabolic Panel:  Recent Labs  16/10/96 1140  06/02/12 1708 06/03/12 0605   07/30/12 0440  02/21/13 1945 02/22/13 0450 02/23/13 0552  NA 134*  < > 137 140  < >  --   < > 139 137 136  K 5.0  < > 4.5 4.5  < >  --   < > 3.8 3.3* 3.9  CL 95*  < > 100 100  < >  --   < > 99 101 102  CO2 30  < > 30 31  < >  --   < > 27 26 25   GLUCOSE 104*  < > 101* 97  < >  --   < > 91 93 106*  BUN 19  < >  13 10  < >  --   < > 9 8 7   CREATININE 2.20*  < > 1.19* 1.00  < >  --   < > 0.59 0.54 0.64  CALCIUM 9.7  < > 9.7 10.2  < >  --   < > 10.3 10.3 10.1  MG 1.9  --   --   --   --  1.7  --   --   --   --   PHOS 5.5*  < > 2.8 2.6  --  3.7  --   --   --   --   < > = values in this interval not displayed. Liver Function Tests:  Recent Labs  08/15/12 1820 09/01/12 0552 02/21/13 1945  AST 22 31 22   ALT 12 14 11   ALKPHOS 46 59 80  BILITOT 0.6 0.4 0.4  PROT 6.4 6.0 7.7  ALBUMIN 3.0* 2.6* 3.2*    Recent Labs  03/28/12 1457 03/29/12 1140 05/23/12 1428  LIPASE 40 44 25  CBC:  Recent Labs  09/20/12 1529  02/12/13 1316 02/21/13 1945 02/22/13 0450 02/23/13 0552  WBC 10.0  < > 7.8 11.6* 11.4* 10.3  NEUTROABS 6.8  --  4.8 8.6*  --   --   HGB 13.9  < > 12.3 13.2 12.7 12.5  HCT 39.9  < > 36.2 40.0 39.8 38.6  MCV 96.1  < > 99.7 100.0 101.0* 101.3*  PLT 256  < > 212 275 282 264  < > = values in this interval not displayed. Cardiac Enzymes:  Recent Labs  06/01/12 0730  06/02/12 1707 06/03/12 0605 06/03/12 1107 07/27/12 1717 08/15/12 1820 02/21/13 1945  CKTOTAL  --   < > 44 28 42  --   --  137  CKMB  --   < > 1.7 1.7 1.7  --   --   --   TROPONINI <0.30  --   --   --   --  <0.30 <0.30  --   < > = values in this interval not displayed. CBG:  Recent Labs  09/05/12 0754 09/05/12 1212 09/21/12 0547  GLUCAP 87 93 91    Imaging and Procedures: 02/21/13 cxr:  Low lung volumes with mild bibasilar atelectasis. Otherwise, no  acute cardiopulmonary disease.  Stable mild cardiomegaly with left jugular prominence.  Assessment/Plan 1. Schizoaffective disorder, bipolar  type -resume invega 6mg  for her psychosis -notes indicate she became too drowsy with this so we will monitor  2. Obesity, morbid -remains in bed most of the time, very inactive -is to be on low fat diet  3. Mild intellectual disabilities Noted, must be considered when working with her and planning her long term location--cannot safely go home again--needs more help than her boyfriend can offer  4. UTI (lower urinary tract infection) Complete abx as planned  5. Hypertension At goal with current therapy  6. Hypothyroidism Continue synthroid at current dose   Functional status: dependent in adls except feeding  Family/ staff Communication: discussed with nursing staff  Labs/tests ordered: cbc, bmp next week

## 2013-03-06 IMAGING — CR DG CHEST 2V
2 series · 2 of 2 positions shown · non-contrast
Comparison: Two-view chest x-ray 10/27/2011, 10/08/2028,
09/15/2011, 07/31/2011, 06/16/2011, 03/29/2010.

CLINICAL DATA: Chest pain and left shoulder pain.  History of CHF,
renal failure, and COPD.

CHEST - 2 VIEW

[w chest pa]
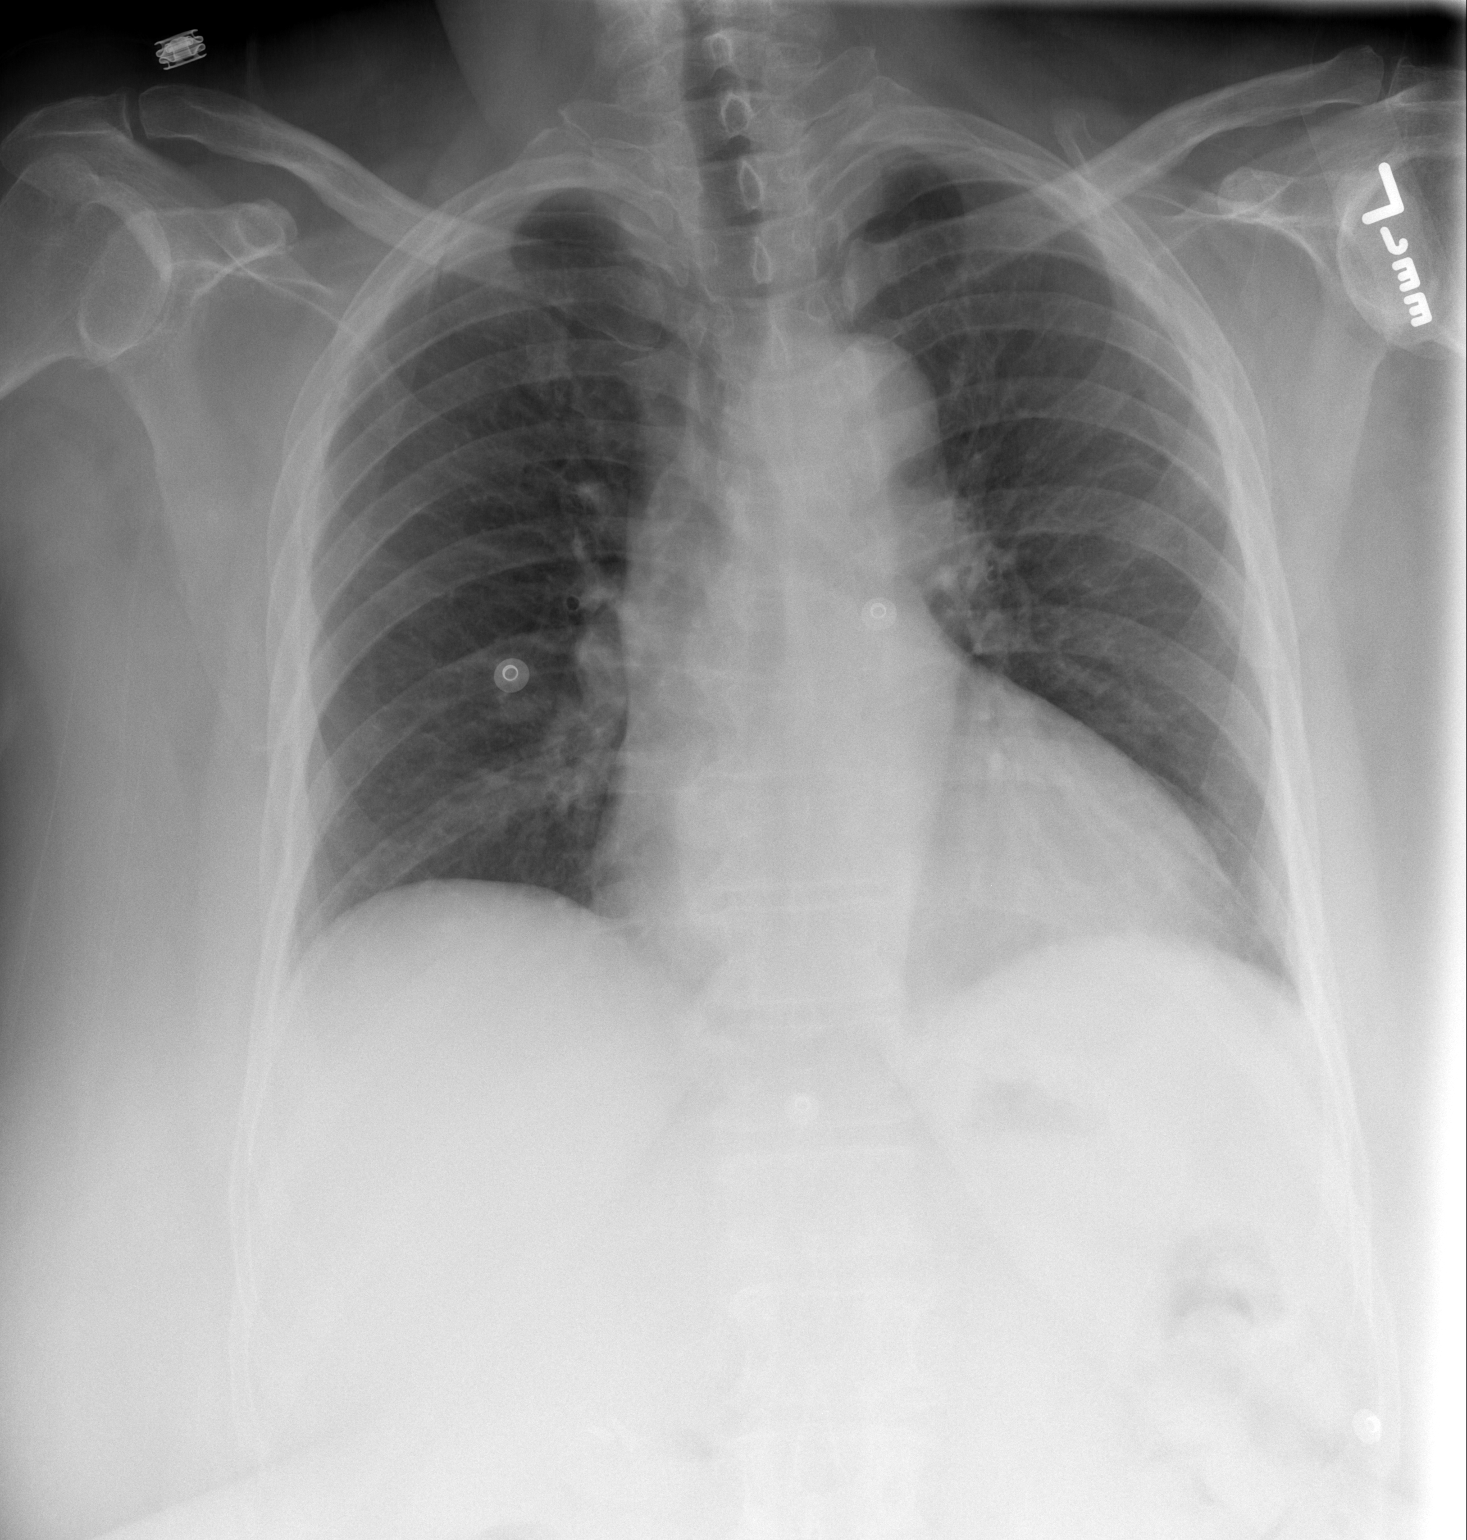

[w chest lat]
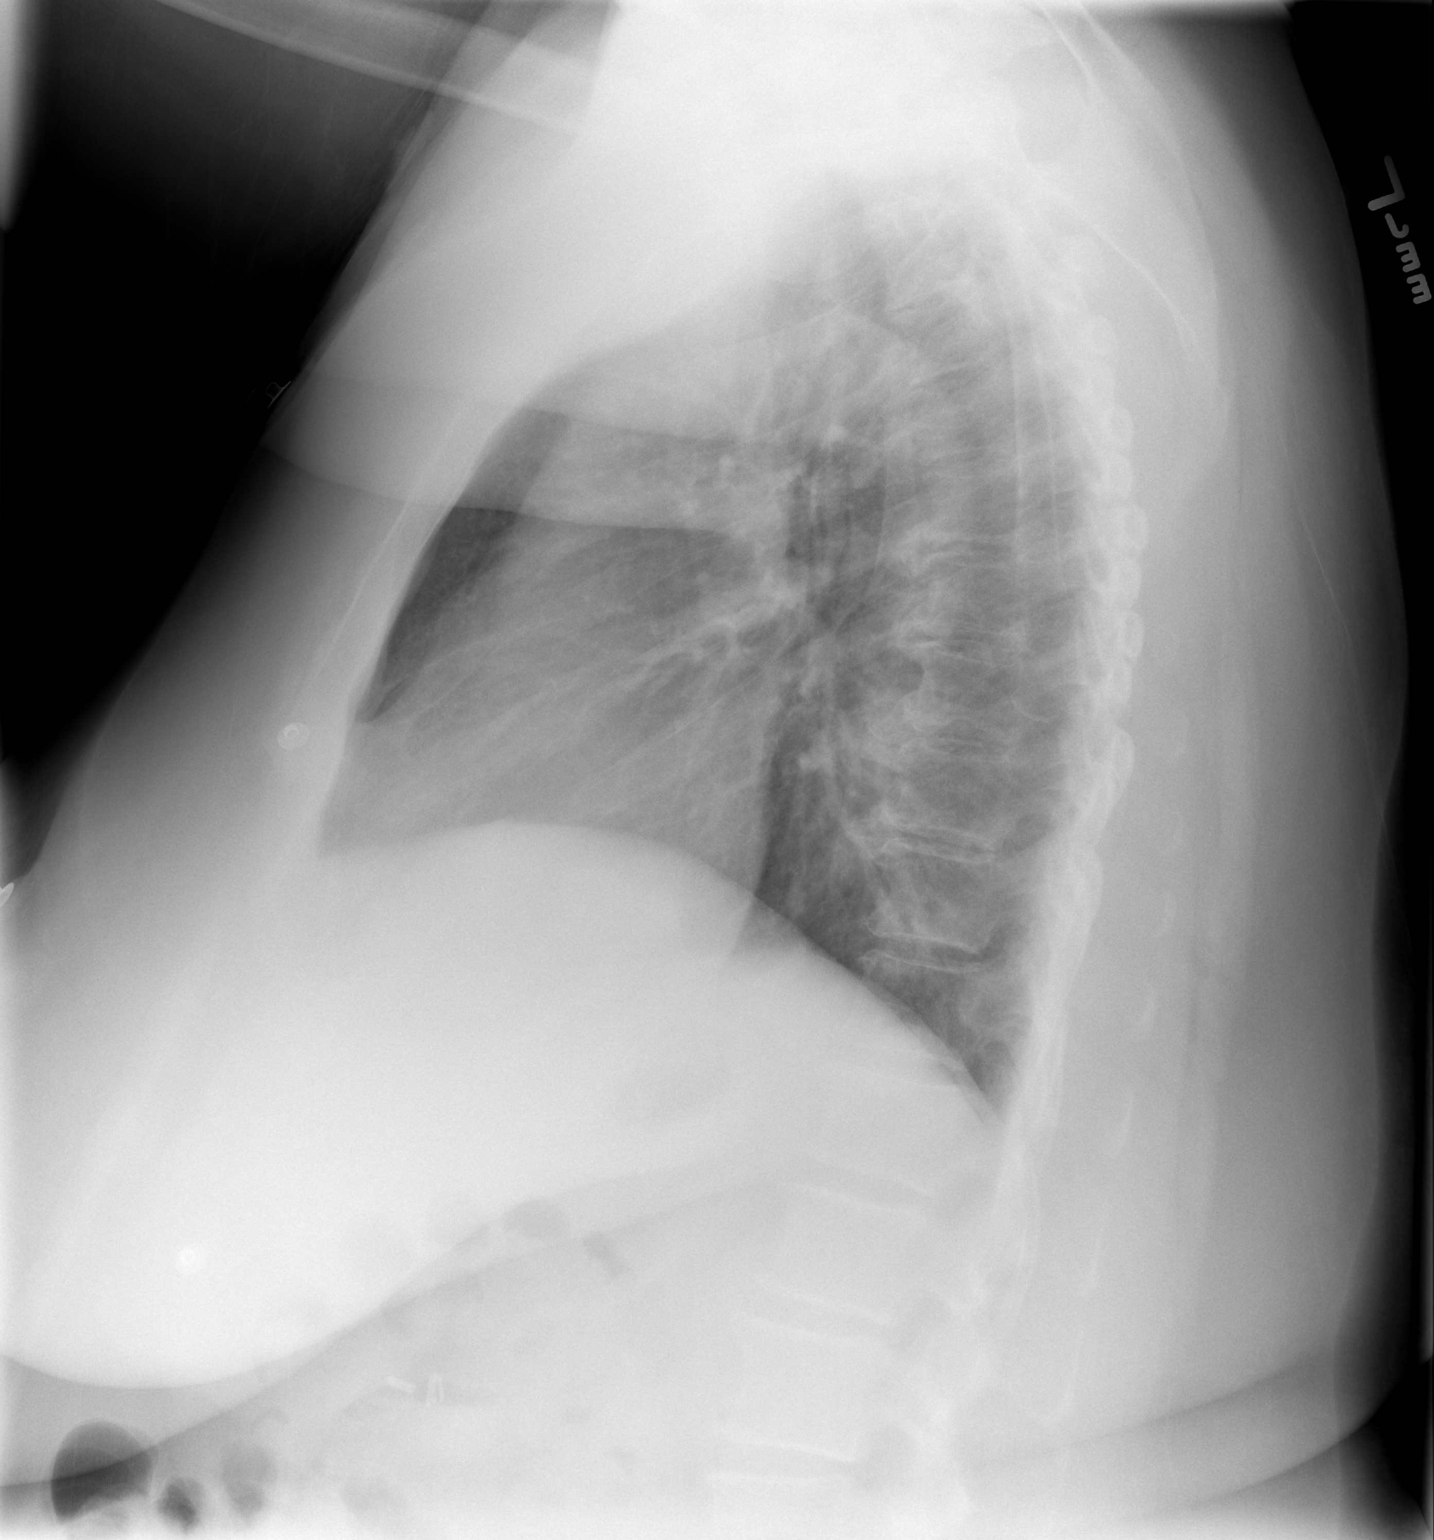

[2 of 2 positions shown; findings below may reference images not displayed]

FINDINGS: Cardiac silhouette moderately enlarged but stable.
Thoracic aorta mildly tortuous, unchanged.  Hilar and mediastinal
contours otherwise unremarkable.  Lungs clear.  Bronchovascular
markings normal.  No pleural effusions.  Degenerative changes
throughout the thoracic spine.  No significant interval change.
IMPRESSION: Stable cardiomegaly.  No acute cardiopulmonary disease.

## 2013-03-06 IMAGING — CR DG SHOULDER 2+V*L*
3 series · 3 of 3 positions shown · non-contrast
Comparison: Left shoulder x-rays 06/04/2011.

CLINICAL DATA: Chest pain and left shoulder pain.  No known injury.

LEFT SHOULDER - 2+ VIEW

[w shoulder ap external left]
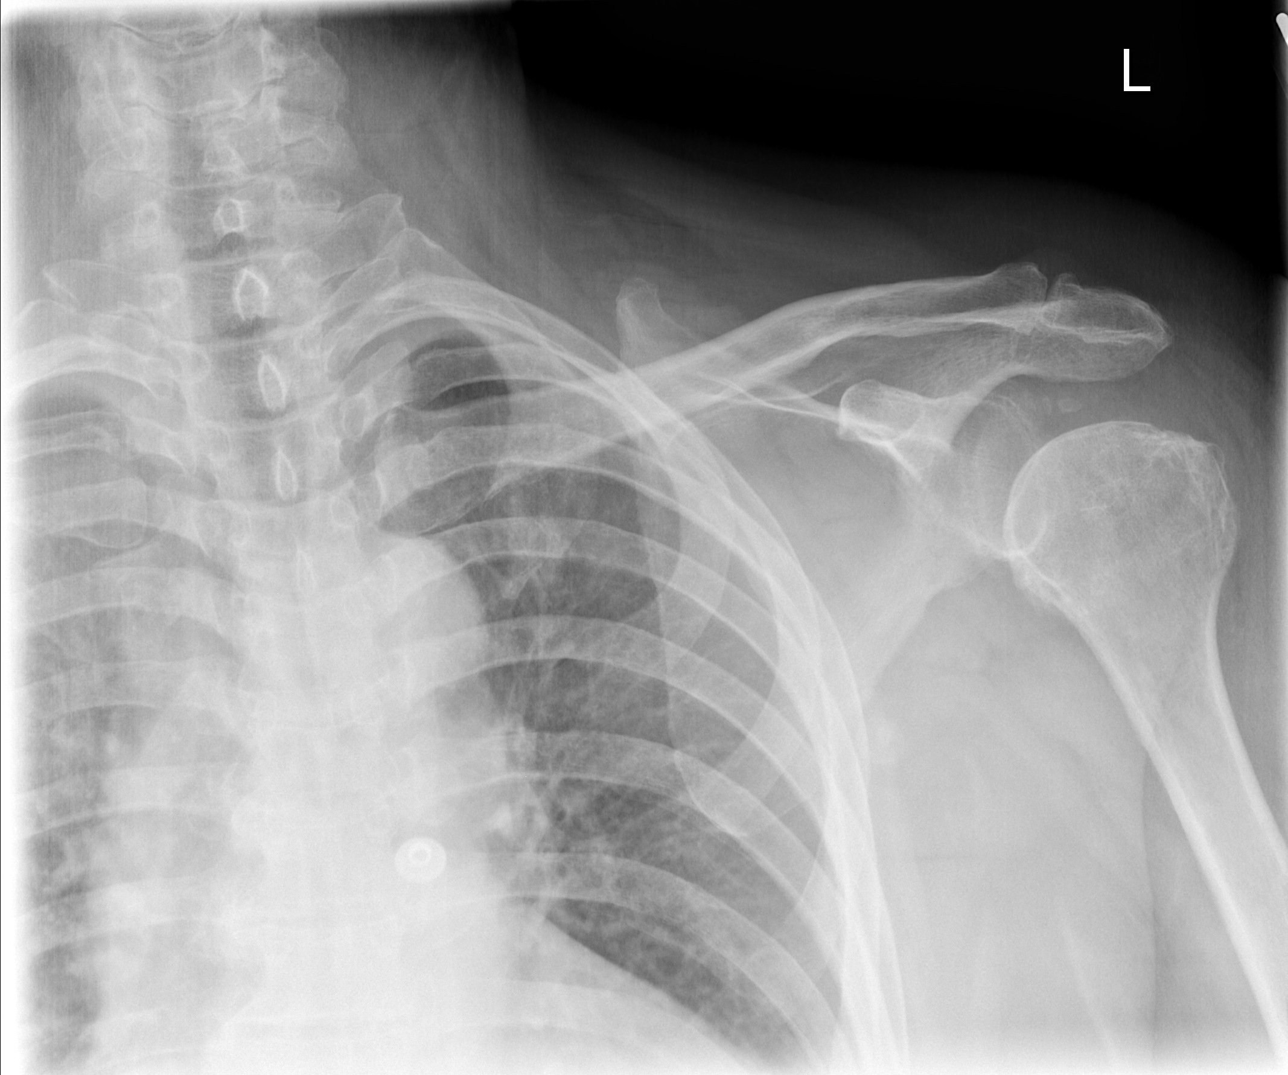

[w shoulder ap internal left]
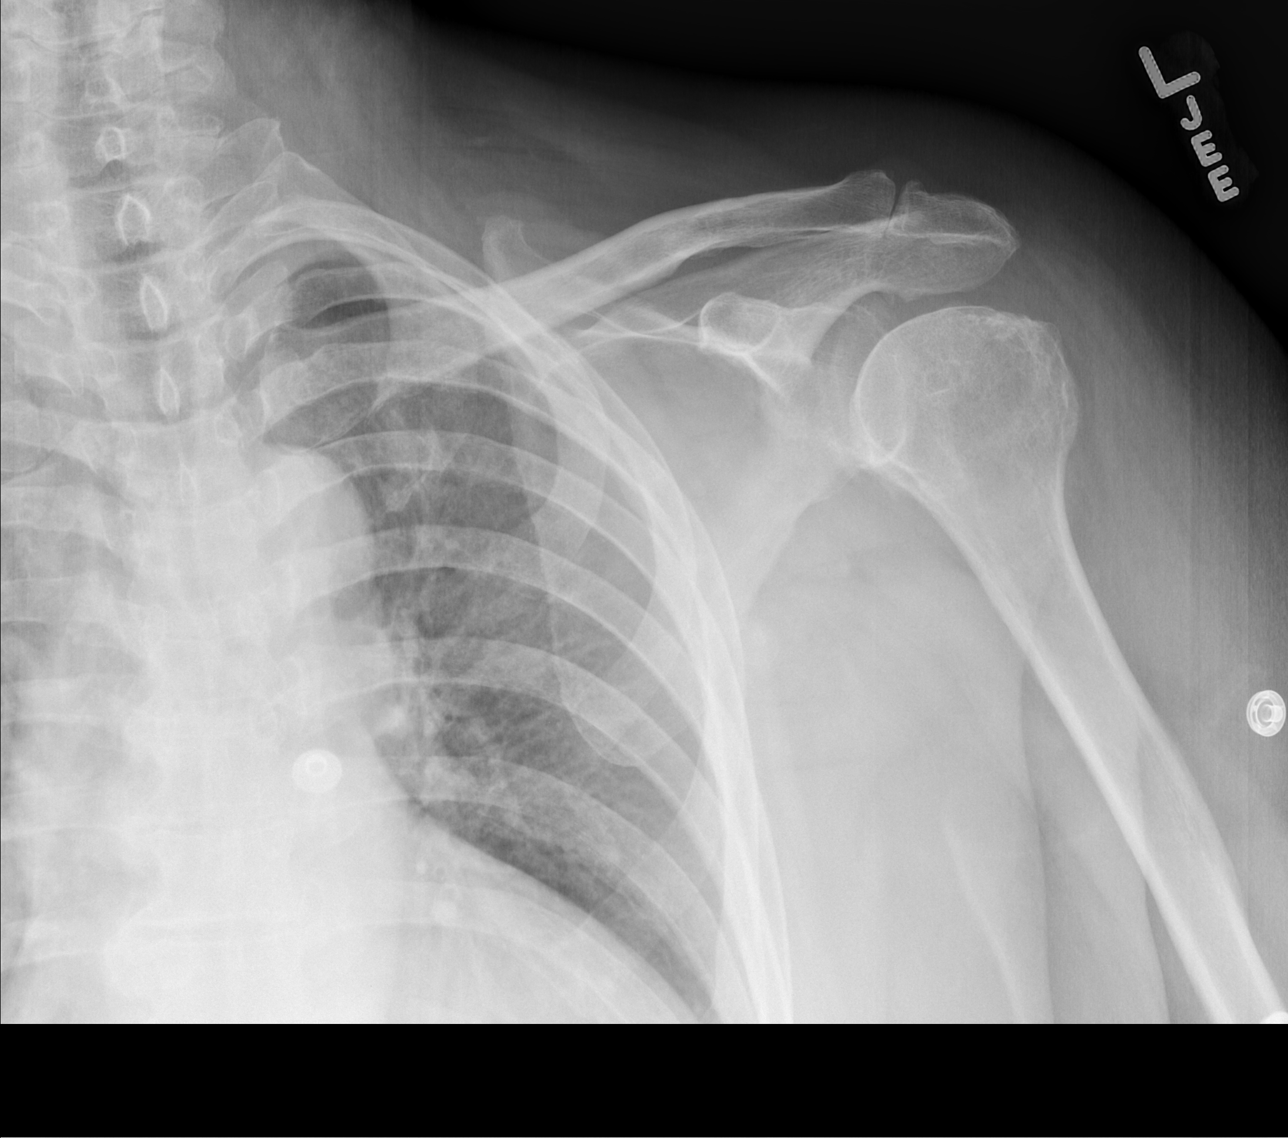

[w shoulder y view left]
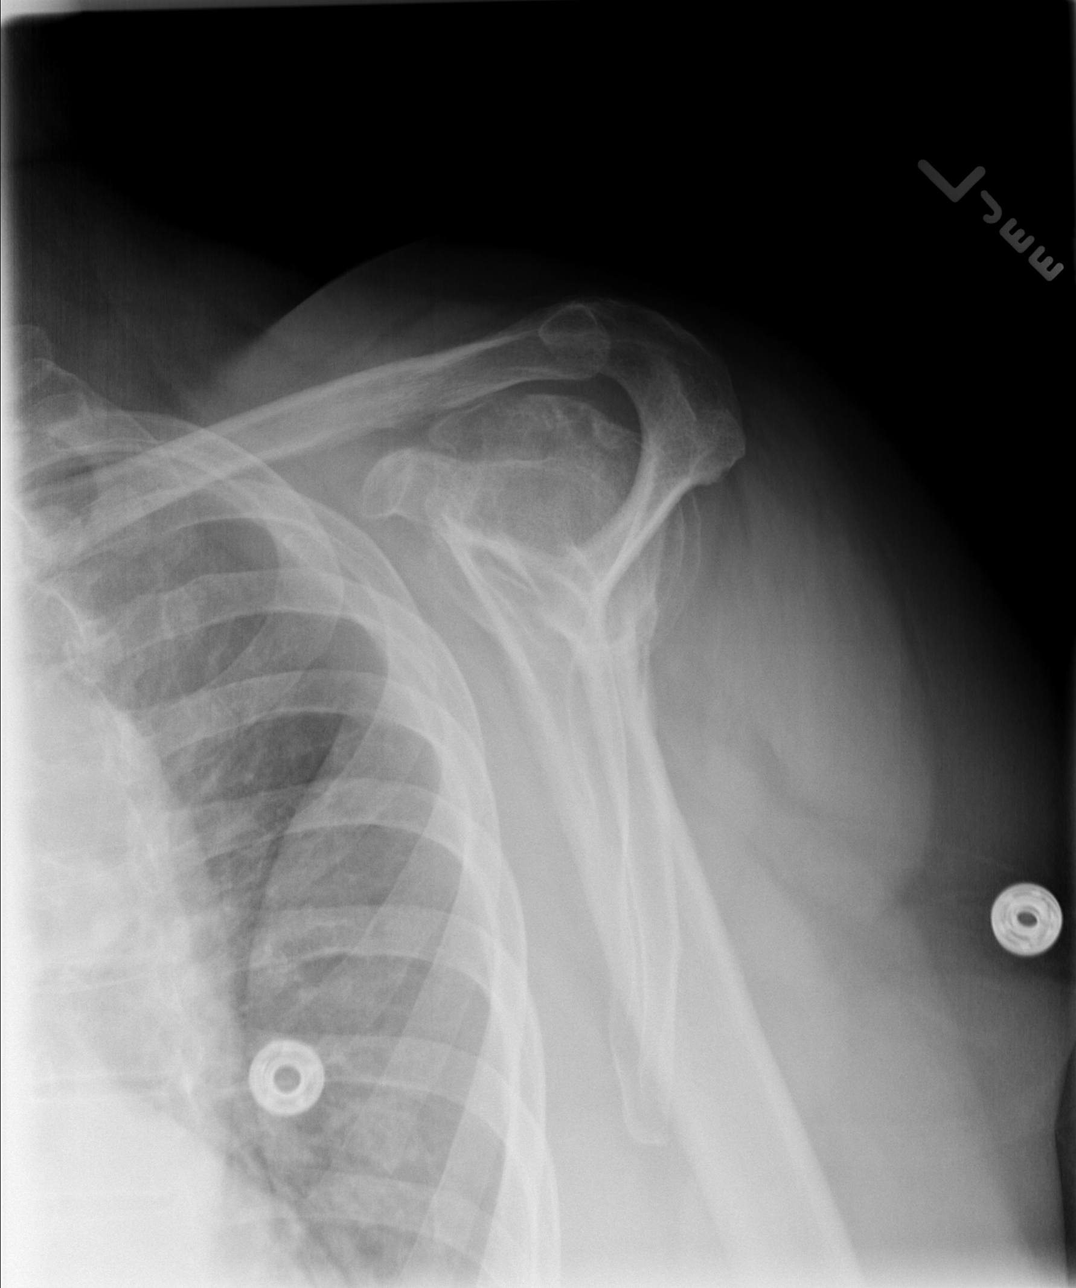

[3 of 3 positions shown; findings below may reference images not displayed]

FINDINGS: Interval development of a shoulder joint effusion and
calcified loose bodies within the joint.  No evidence of acute
fracture or glenohumeral dislocation.  Limited range of motion
suspected, as both AP images were obtained with the shoulder
internally rotated.  Acromioclavicular joint intact with moderate
degenerative changes.  Well-preserved bone mineral density.
IMPRESSION: No acute osseous abnormality.  Joint effusion and calcified loose
bodies within the joint.  Degenerative changes involving the
acromioclavicular joint.

## 2013-03-07 ENCOUNTER — Other Ambulatory Visit: Payer: Self-pay | Admitting: *Deleted

## 2013-03-07 MED ORDER — TRAMADOL HCL 50 MG PO TABS: 50.0000 mg | ORAL_TABLET | Freq: Four times a day (QID) | ORAL | Status: DC

## 2013-03-21 IMAGING — CR DG CHEST 2V
2 series · 2 of 2 positions shown · non-contrast
Comparison: 11/22/2011

CLINICAL DATA: Short of breath

CHEST - 2 VIEW

[w chest lat]
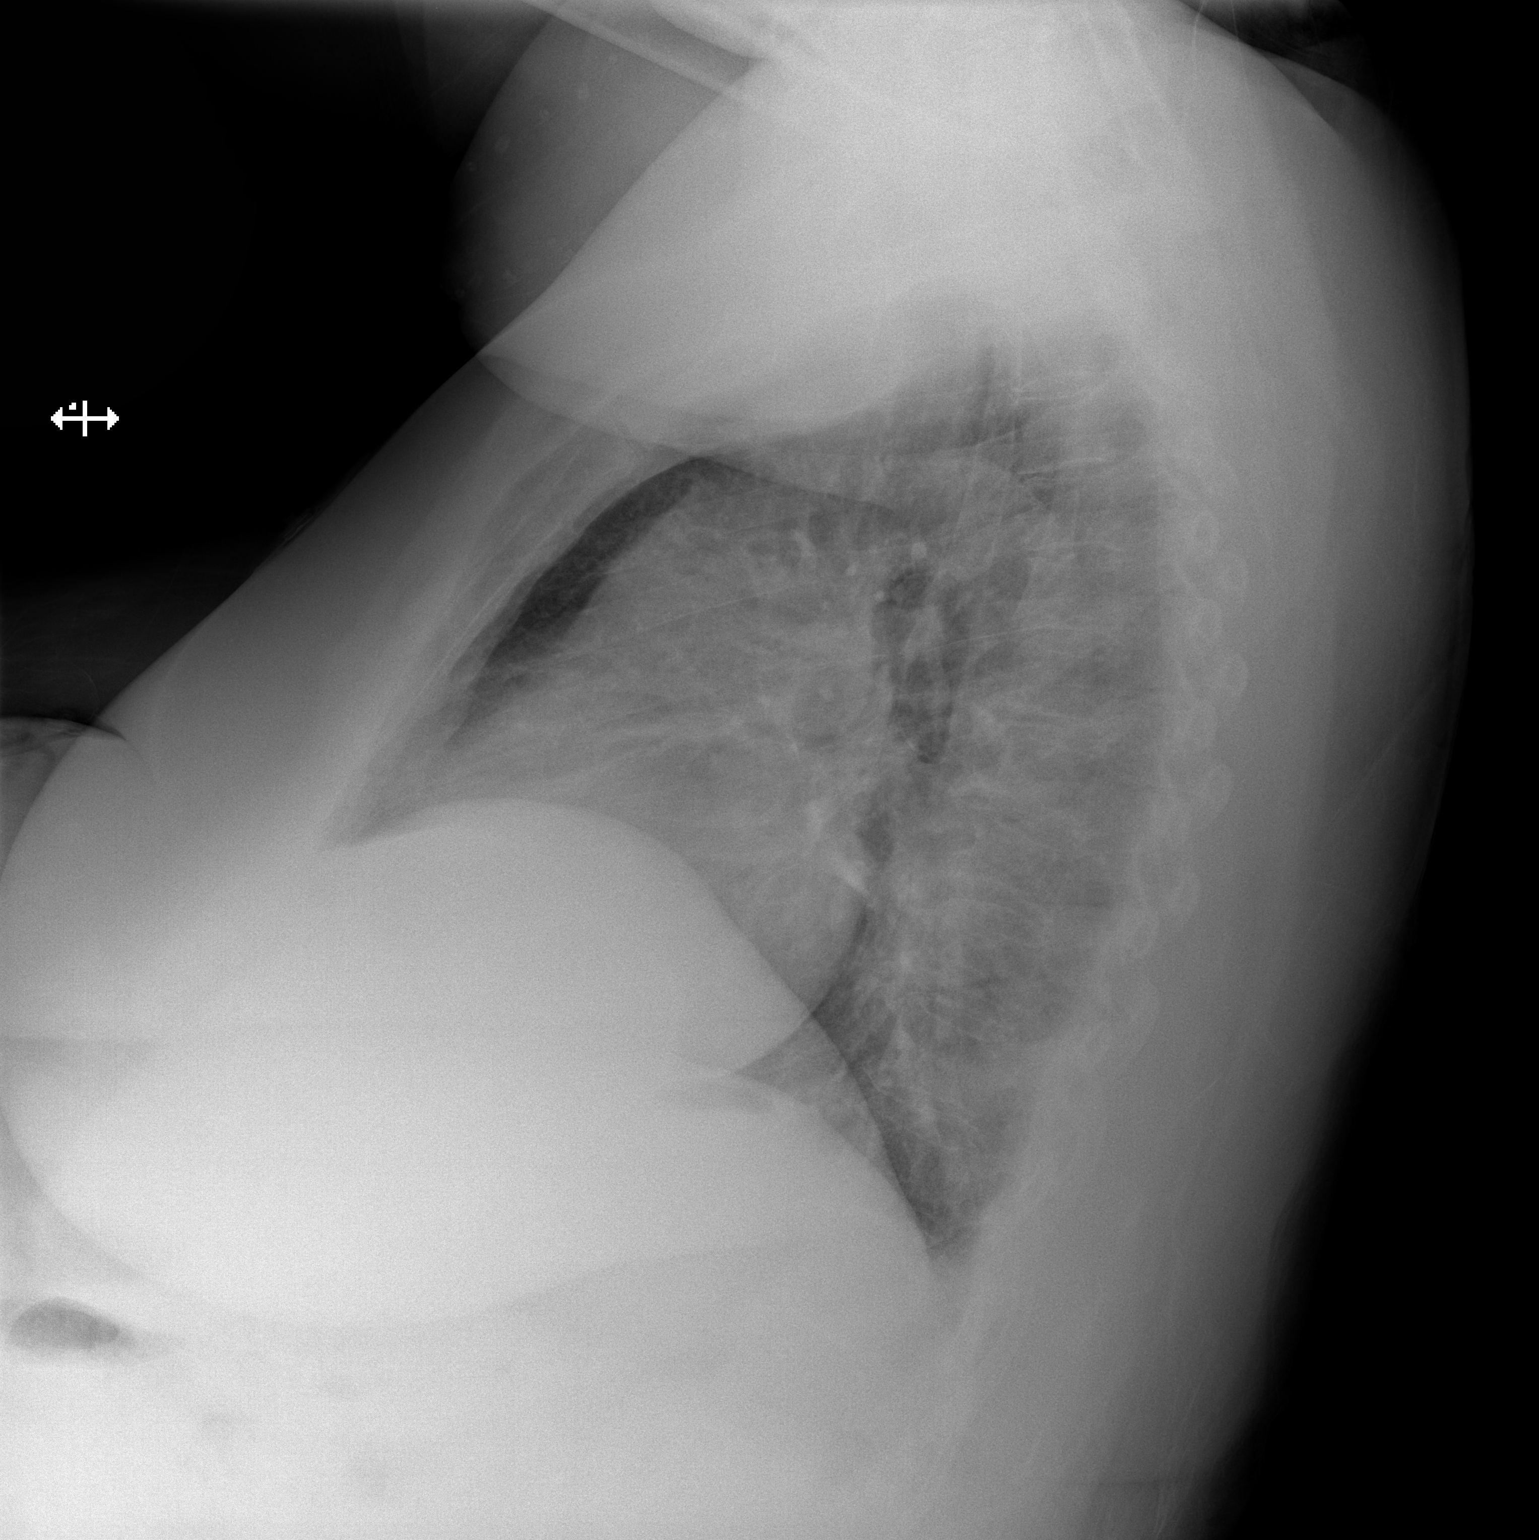

[x chest ap]
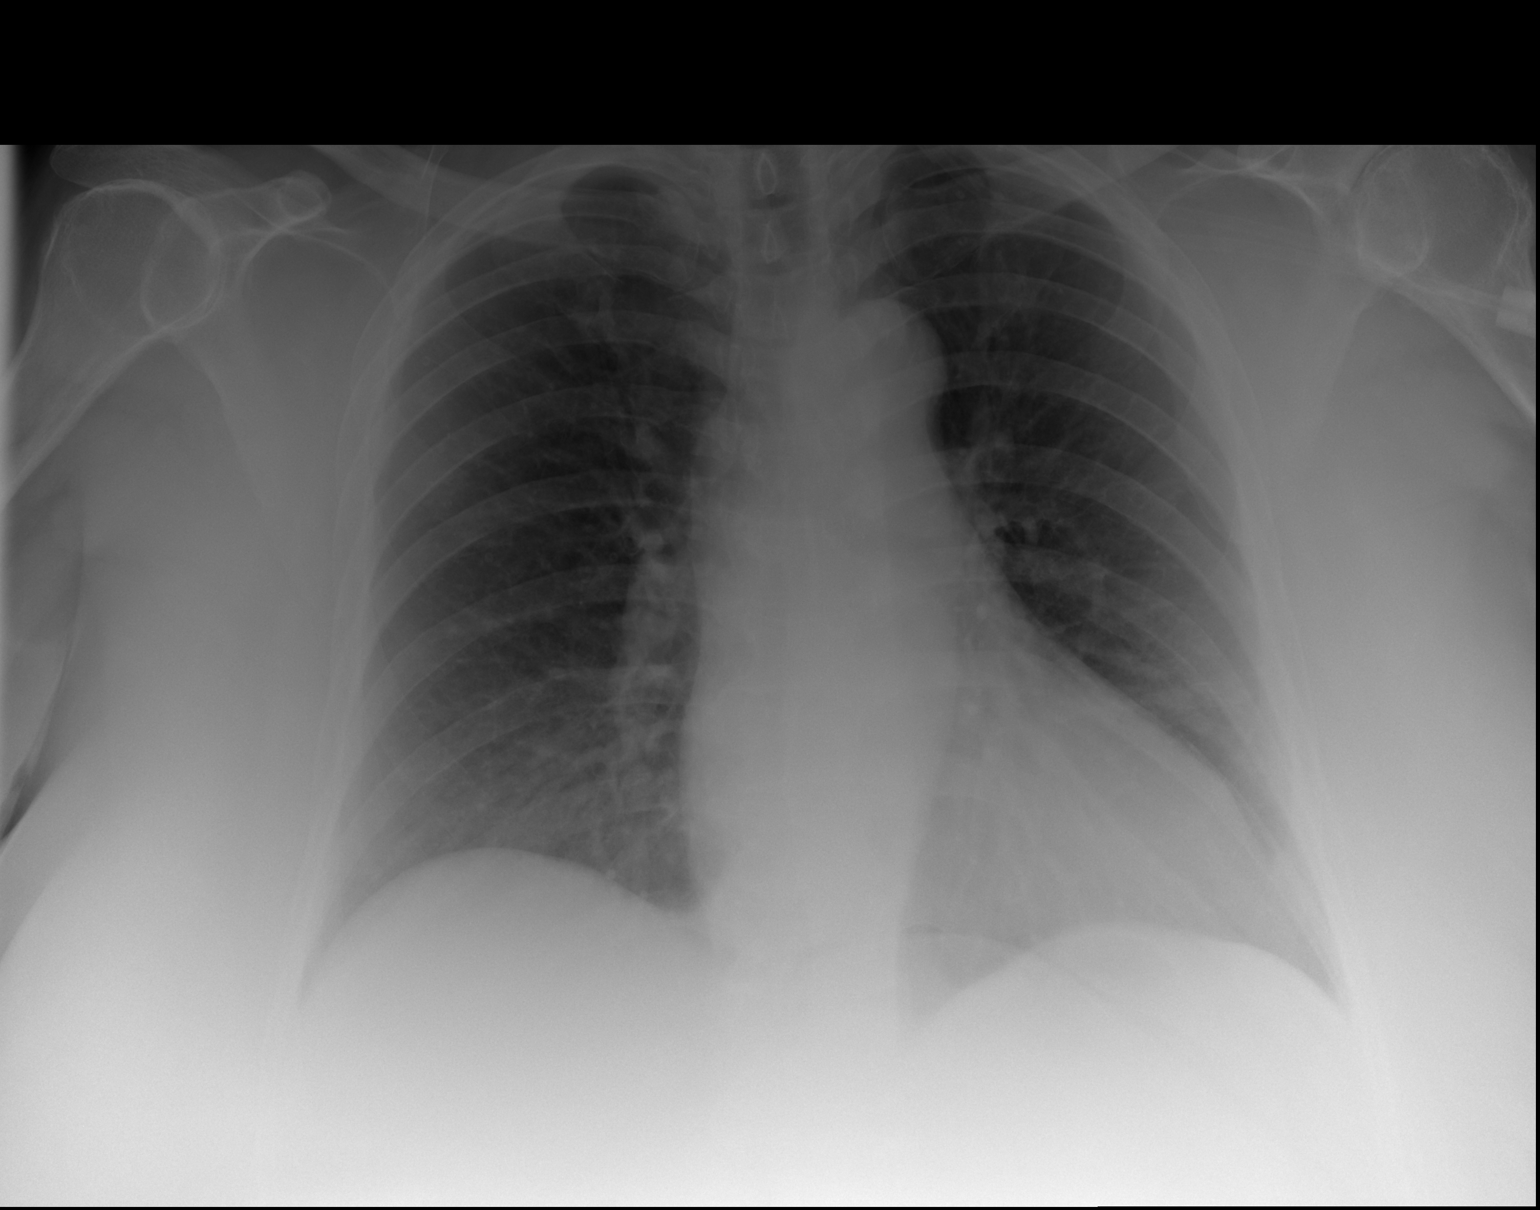

[2 of 2 positions shown; findings below may reference images not displayed]

FINDINGS: Heart is mildly enlarged.  Lungs are clear.  Pulmonary
vascularity is within normal limits.
IMPRESSION: Cardiomegaly without edema.

## 2013-03-21 IMAGING — CR DG CHEST 1V PORT
1 series · 1 of 1 positions shown · non-contrast
Comparison: 11/07/2011.

CLINICAL DATA: Chest pain.

PORTABLE CHEST - 1 VIEW

[AP]
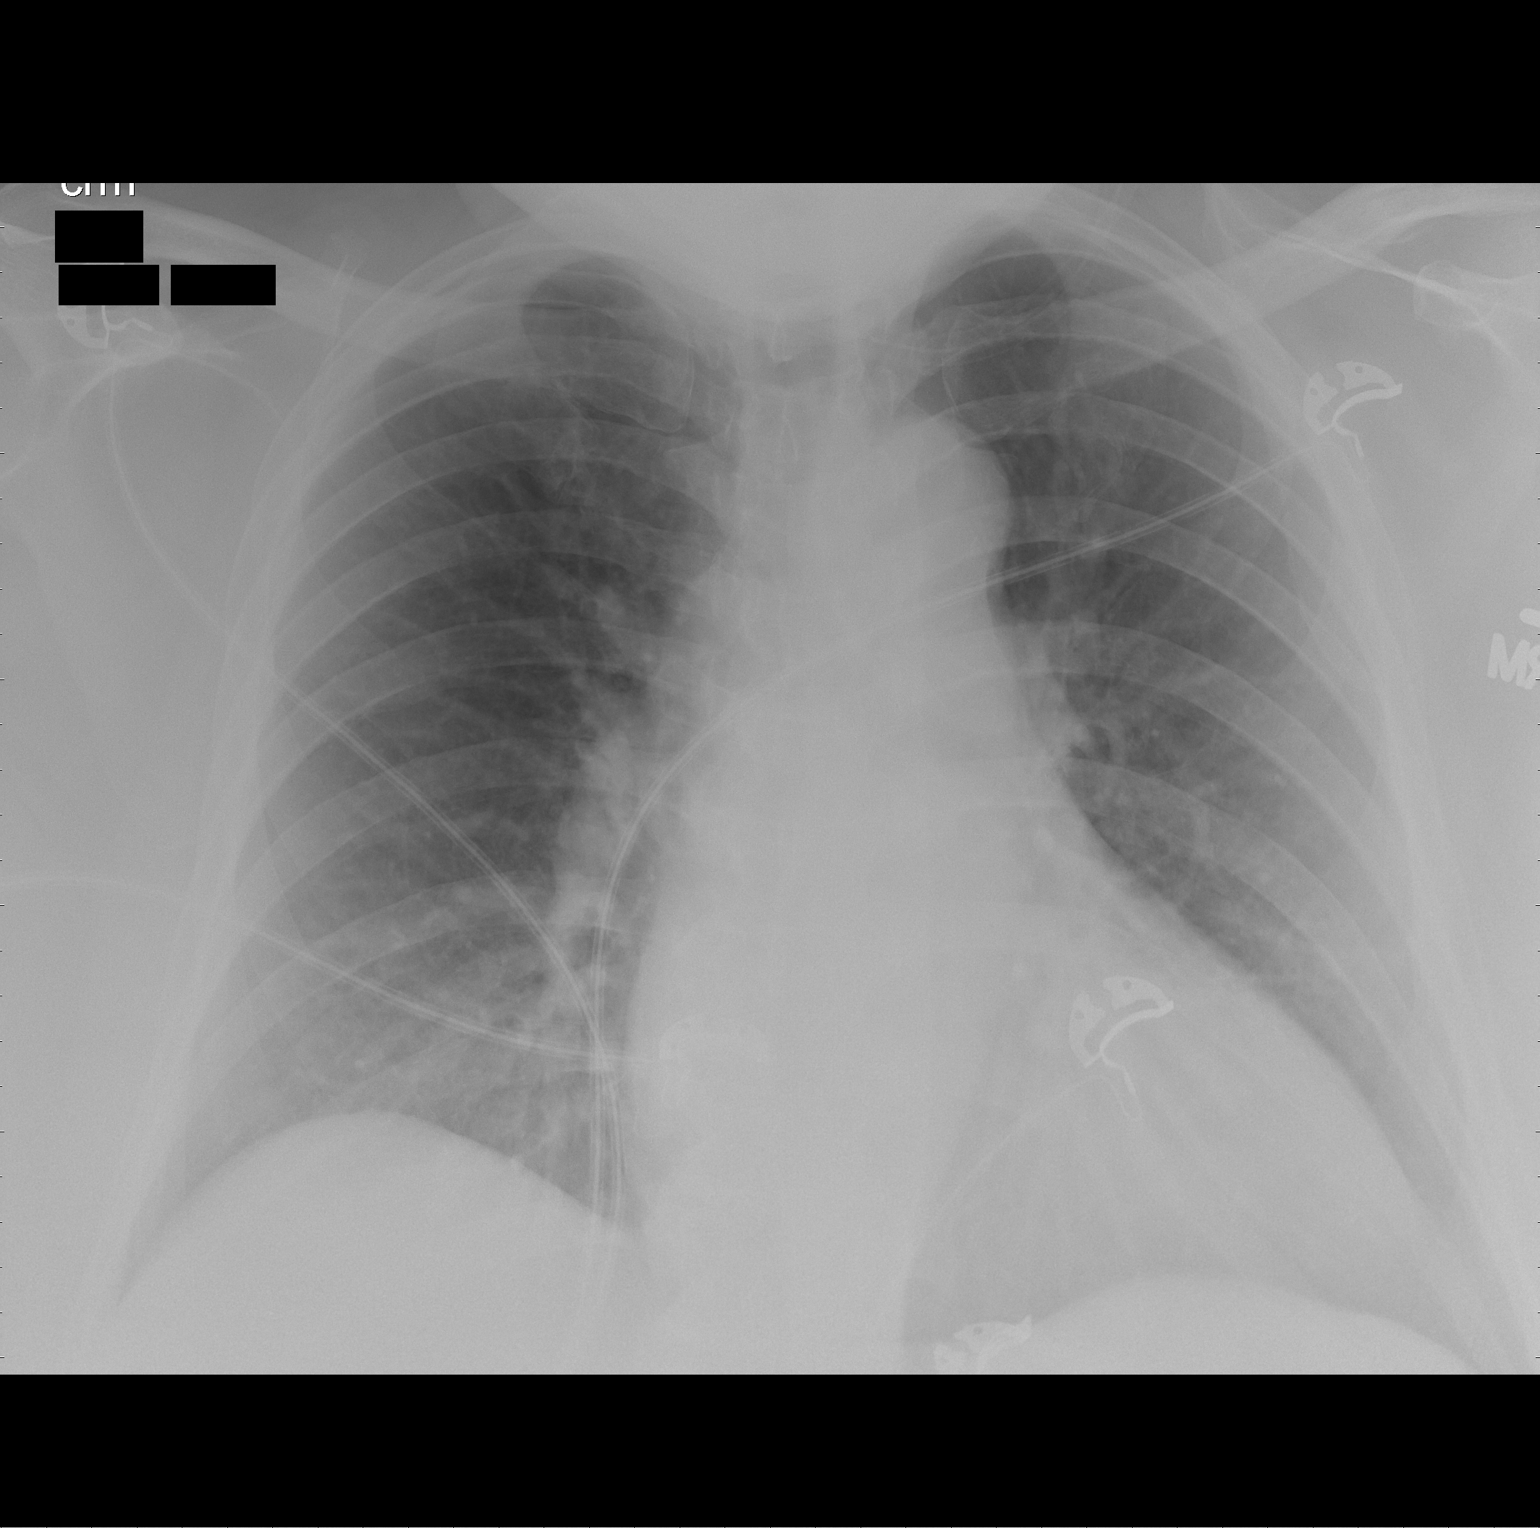

[1 of 1 positions shown; findings below may reference images not displayed]

FINDINGS: The heart is mildly enlarged but stable.  There is
tortuosity of the thoracic aorta.  The lungs are clear.  No pleural
effusion.  The bony thorax is intact.
IMPRESSION: Mild stable cardiac enlargement.  No acute pulmonary findings.

## 2013-04-04 IMAGING — CR DG CHEST 2V
1 series · 1 of 1 positions shown · non-contrast
Comparison: 11/22/2011

CLINICAL DATA: Chest pain.  Shortness of breath.  Cough.  Previous
myocardial infarct.  Asthma.

CHEST - 2 VIEW

[view not recorded]
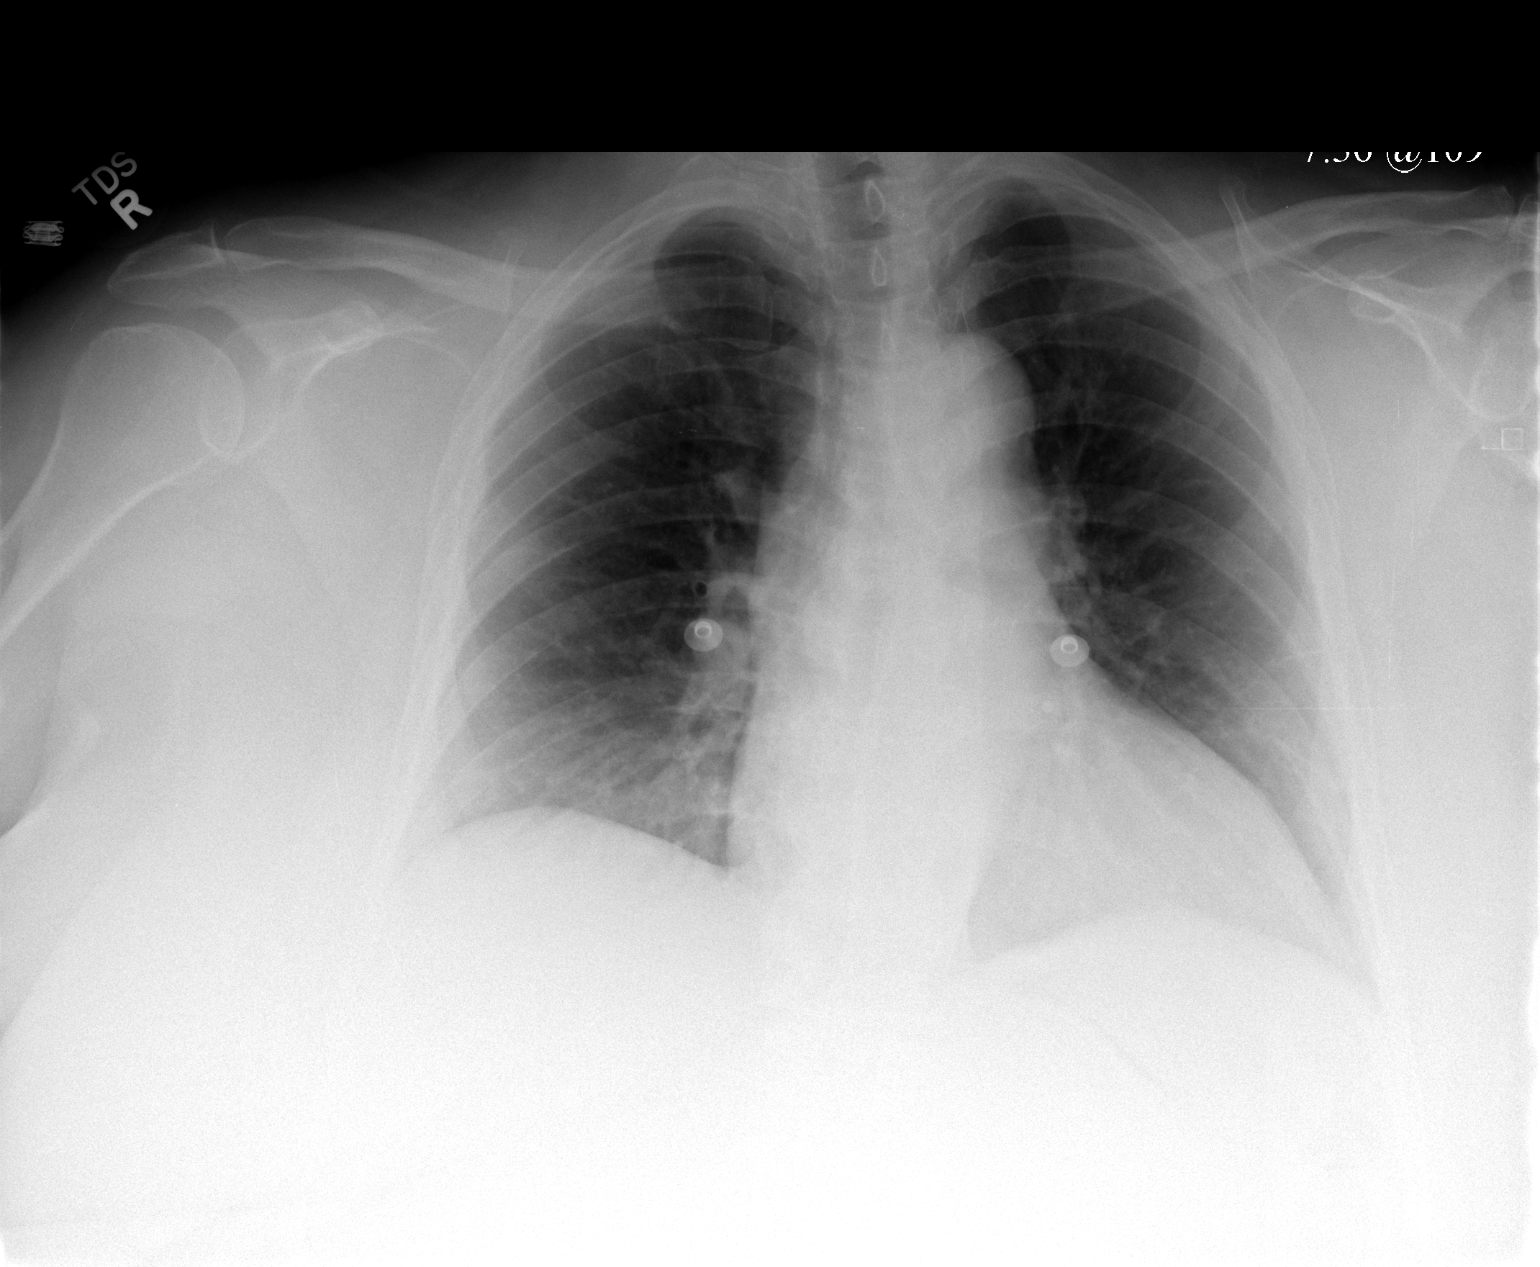

[1 of 1 positions shown; findings below may reference images not displayed]

FINDINGS: Mild cardiomegaly stable.  Both lungs are clear.  No
evidence of pleural effusion.  No mass or lymphadenopathy
identified.  Thoracic spine degenerative changes are again seen.
IMPRESSION: Stable mild cardiomegaly.  No active lung disease.

## 2013-04-08 ENCOUNTER — Encounter: Payer: Self-pay | Admitting: *Deleted

## 2013-04-15 IMAGING — CR DG CHEST 1V PORT
1 series · 1 of 1 positions shown · non-contrast
Comparison: 12/16/2011

CLINICAL DATA: Chest pain, CHF

PORTABLE CHEST - 1 VIEW

[AP]
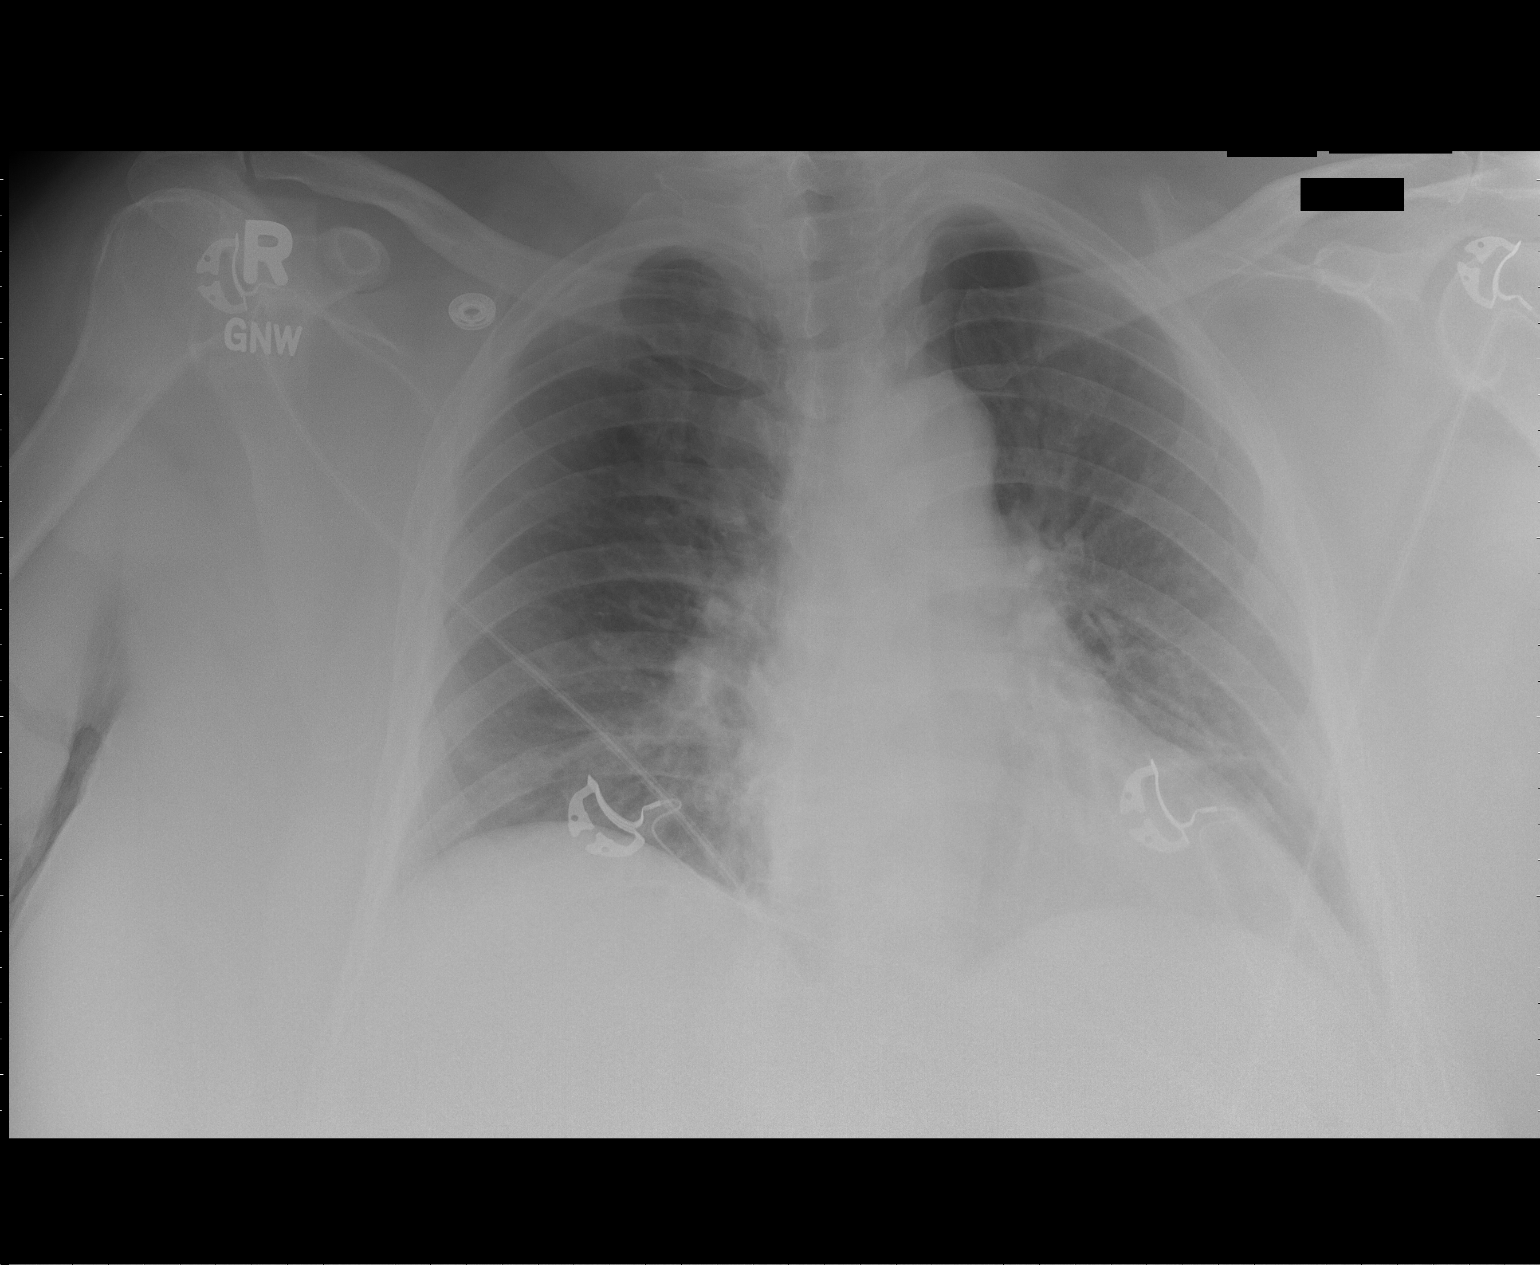

[1 of 1 positions shown; findings below may reference images not displayed]

FINDINGS: Degraded by hypoaeration and patient body habitus.  Heart
size upper normal limits.  Central vascular congestion. No overt
edema or focal consolidation.  No large effusion.  No pneumothorax.
No acute osseous finding.  Multilevel degenerative change.
IMPRESSION: Cardiomegaly without overt edema or focal consolidation.

## 2013-04-19 ENCOUNTER — Non-Acute Institutional Stay (SKILLED_NURSING_FACILITY): Payer: Medicare Other | Admitting: Internal Medicine

## 2013-04-19 DIAGNOSIS — I10 Essential (primary) hypertension: Secondary | ICD-10-CM

## 2013-04-19 DIAGNOSIS — G8929 Other chronic pain: Secondary | ICD-10-CM

## 2013-04-19 DIAGNOSIS — E039 Hypothyroidism, unspecified: Secondary | ICD-10-CM

## 2013-04-19 DIAGNOSIS — F259 Schizoaffective disorder, unspecified: Secondary | ICD-10-CM | POA: Insufficient documentation

## 2013-04-19 DIAGNOSIS — J449 Chronic obstructive pulmonary disease, unspecified: Secondary | ICD-10-CM

## 2013-04-19 DIAGNOSIS — R0609 Other forms of dyspnea: Secondary | ICD-10-CM

## 2013-04-19 DIAGNOSIS — K219 Gastro-esophageal reflux disease without esophagitis: Secondary | ICD-10-CM

## 2013-04-19 DIAGNOSIS — R0600 Dyspnea, unspecified: Secondary | ICD-10-CM | POA: Insufficient documentation

## 2013-04-19 NOTE — Progress Notes (Signed)
Patient ID: [name], female   DOB: 07-14-1950, 62 y.o.   MRN: [id number]  [name] living [employer]  Chief Complaint  Patient presents with  . Discharge Note   Allergies  Allergen Reactions  . Food Rash    "lasagna"= rash from the noodles  . Penicillins Hives  . 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 She has wored with therapy team here. 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 She is seen in her room today and denies any complaints.  Review of Systems  Constitutional: Negative for fever and chills.  HENT: Negative for congestion.   Eyes: Negative for blurred vision.  Respiratory: Negative for cough.   Cardiovascular: Negative for chest pain.  Gastrointestinal: Negative for heartburn, abdominal pain and constipation.  Genitourinary: Negative for dysuria.  Musculoskeletal: Negative for falls.  Skin: Negative for rash.  Neurological: Negative for dizziness.  Endo/Heme/Allergies: Does not bruise/bleed easily.  Psychiatric/Behavioral: Negative for suicidal ideas.      Past Medical History  Diagnosis Date  . Schizoaffective disorder   . Urinary tract infection   . Hypertension   . GERD (gastroesophageal reflux disease)   . Hypothyroidism   . Obesity   . Asthma   . COPD (chronic obstructive pulmonary disease)   . CHF (congestive heart failure)   . Seizures   . Morbid obesity   . Chronic pain   . Hypercholesteremia   . Anginal pain   . Myocardial infarction 2002  . Pneumonia     "several times" (05/23/2012)  . Diabetes mellitus     "I'm not diabetic anymore" (05/23/2012)  . Anemia   . History of blood transfusion 1993    "when I had my hysterectomy" (05/23/2012)  . H/O hiatal hernia   . Daily headache    . Arthritis     "severe; all over my body" (05/23/2012)  . Hypoventilation syndrome     [name] 05/30/2012  . Atrial fibrillation   . Atrial flutter, paroxysmal     [name] 05/30/2012  . Exertional dyspnea   . Shortness of breath     "all the time lately" (05/30/2012)   Medication reviewed. See [facility]  Physical exam  BP 124/86  Pulse 73  Temp(Src) 97.1 F (36.2 C)  Resp 16  Ht 5\' 4"  (1.626 m)  Wt 294 lb (133.358 kg)  BMI 50.44 kg/m2  SpO2 96%  Constitutional: She is oriented to person, place, and time.  Morbidly obese white female seen resting in bed, left leg contracted at knee  HENT:   Head: Normocephalic and atraumatic.  Eyes: EOM are normal. Pupils are equal, round, and reactive to light.  Cardiovascular: Normal rate, regular rhythm, normal heart sounds and intact distal pulses.   Pulmonary/Chest: Breath sounds normal. No respiratory distress.  Abdominal: Soft. Bowel sounds are normal. She exhibits no distension. There is no tenderness.  Musculoskeletal: Normal range of motion. She exhibits no tenderness.  Neurological: She is alert and oriented to person, place, and time.  Psychiatric: she is calm and able to carry out a normal conversation today   Assessment/Plan  [name] is stable to be discharged home with home heath services for PT and OT. She will require CNA services, RN services and social work. She will need cleaning to  her right great toe with betadine and triple antibiotic ointment daily. She will also need a script for hoyer lift and hospital bed.  She has copd and CHF and needs the head end of her bed elevated. She is morbidly obese and has breathing difficulties. The bed wedges wont be able to provide enough elevation to help with breathing issue and she requires frequent position adjustment which will be difficult to achieve in normal bed. Scripts for her medications for 30 days will be provided  GERD  continue omeprazole 20 mg daily  Chronic  pain Continue tramadol 50 mg qid  Constipation Continue miralax twice a week  Copd Continue her breathing treatment  Schizoaffective disorder, bipolar type Continue invega 9 mg daily and risperidone 0.25 mg daily for her psychosis. Also to continue her divalproex sprinkles 250 mg bid for now  Mild intellectual disabilities Thus will need the home health services  Hypertension At goal with current therapy. Continue metoprolol 50 mg daily  Hypothyroidism Continue synthroid at current dose 150 mcg daily

## 2013-04-23 ENCOUNTER — Encounter (HOSPITAL_COMMUNITY): Payer: Self-pay | Admitting: Emergency Medicine

## 2013-04-23 ENCOUNTER — Inpatient Hospital Stay (HOSPITAL_COMMUNITY)
Admission: EM | Admit: 2013-04-23 | Discharge: 2013-04-26 | DRG: 194 | Disposition: A | Discharge status: Skilled Nursing Facility | Payer: Medicare Other | Attending: Internal Medicine | Admitting: Internal Medicine

## 2013-04-23 ENCOUNTER — Emergency Department (HOSPITAL_COMMUNITY): Payer: Medicare Other

## 2013-04-23 DIAGNOSIS — E119 Type 2 diabetes mellitus without complications: Secondary | ICD-10-CM

## 2013-04-23 DIAGNOSIS — I10 Essential (primary) hypertension: Secondary | ICD-10-CM

## 2013-04-23 DIAGNOSIS — I5030 Unspecified diastolic (congestive) heart failure: Secondary | ICD-10-CM | POA: Diagnosis present

## 2013-04-23 DIAGNOSIS — Z7982 Long term (current) use of aspirin: Secondary | ICD-10-CM

## 2013-04-23 DIAGNOSIS — I252 Old myocardial infarction: Secondary | ICD-10-CM

## 2013-04-23 DIAGNOSIS — IMO0002 Reserved for concepts with insufficient information to code with codable children: Secondary | ICD-10-CM

## 2013-04-23 DIAGNOSIS — I509 Heart failure, unspecified: Secondary | ICD-10-CM | POA: Diagnosis present

## 2013-04-23 DIAGNOSIS — G4700 Insomnia, unspecified: Secondary | ICD-10-CM

## 2013-04-23 DIAGNOSIS — Z87891 Personal history of nicotine dependence: Secondary | ICD-10-CM

## 2013-04-23 DIAGNOSIS — E538 Deficiency of other specified B group vitamins: Secondary | ICD-10-CM

## 2013-04-23 DIAGNOSIS — R4182 Altered mental status, unspecified: Secondary | ICD-10-CM

## 2013-04-23 DIAGNOSIS — M545 Low back pain, unspecified: Secondary | ICD-10-CM

## 2013-04-23 DIAGNOSIS — R197 Diarrhea, unspecified: Secondary | ICD-10-CM

## 2013-04-23 DIAGNOSIS — I4891 Unspecified atrial fibrillation: Secondary | ICD-10-CM

## 2013-04-23 DIAGNOSIS — J4489 Other specified chronic obstructive pulmonary disease: Secondary | ICD-10-CM | POA: Diagnosis present

## 2013-04-23 DIAGNOSIS — I219 Acute myocardial infarction, unspecified: Secondary | ICD-10-CM

## 2013-04-23 DIAGNOSIS — J449 Chronic obstructive pulmonary disease, unspecified: Secondary | ICD-10-CM

## 2013-04-23 DIAGNOSIS — Z79899 Other long term (current) drug therapy: Secondary | ICD-10-CM

## 2013-04-23 DIAGNOSIS — Z7401 Bed confinement status: Secondary | ICD-10-CM

## 2013-04-23 DIAGNOSIS — J9601 Acute respiratory failure with hypoxia: Secondary | ICD-10-CM

## 2013-04-23 DIAGNOSIS — I69998 Other sequelae following unspecified cerebrovascular disease: Secondary | ICD-10-CM

## 2013-04-23 DIAGNOSIS — G8929 Other chronic pain: Secondary | ICD-10-CM

## 2013-04-23 DIAGNOSIS — Z8679 Personal history of other diseases of the circulatory system: Secondary | ICD-10-CM

## 2013-04-23 DIAGNOSIS — R0609 Other forms of dyspnea: Secondary | ICD-10-CM

## 2013-04-23 DIAGNOSIS — J189 Pneumonia, unspecified organism: Principal | ICD-10-CM

## 2013-04-23 DIAGNOSIS — E871 Hypo-osmolality and hyponatremia: Secondary | ICD-10-CM

## 2013-04-23 DIAGNOSIS — R29898 Other symptoms and signs involving the musculoskeletal system: Secondary | ICD-10-CM | POA: Diagnosis present

## 2013-04-23 DIAGNOSIS — R079 Chest pain, unspecified: Secondary | ICD-10-CM

## 2013-04-23 DIAGNOSIS — K219 Gastro-esophageal reflux disease without esophagitis: Secondary | ICD-10-CM

## 2013-04-23 DIAGNOSIS — N390 Urinary tract infection, site not specified: Secondary | ICD-10-CM | POA: Diagnosis present

## 2013-04-23 DIAGNOSIS — R0989 Other specified symptoms and signs involving the circulatory and respiratory systems: Secondary | ICD-10-CM

## 2013-04-23 DIAGNOSIS — R82998 Other abnormal findings in urine: Secondary | ICD-10-CM

## 2013-04-23 DIAGNOSIS — G4733 Obstructive sleep apnea (adult) (pediatric): Secondary | ICD-10-CM

## 2013-04-23 DIAGNOSIS — K8020 Calculus of gallbladder without cholecystitis without obstruction: Secondary | ICD-10-CM

## 2013-04-23 DIAGNOSIS — F259 Schizoaffective disorder, unspecified: Secondary | ICD-10-CM

## 2013-04-23 DIAGNOSIS — D649 Anemia, unspecified: Secondary | ICD-10-CM

## 2013-04-23 DIAGNOSIS — G244 Idiopathic orofacial dystonia: Secondary | ICD-10-CM

## 2013-04-23 DIAGNOSIS — E039 Hypothyroidism, unspecified: Secondary | ICD-10-CM

## 2013-04-23 DIAGNOSIS — J9612 Chronic respiratory failure with hypercapnia: Secondary | ICD-10-CM

## 2013-04-23 DIAGNOSIS — E669 Obesity, unspecified: Secondary | ICD-10-CM

## 2013-04-23 DIAGNOSIS — M1710 Unilateral primary osteoarthritis, unspecified knee: Secondary | ICD-10-CM

## 2013-04-23 DIAGNOSIS — I959 Hypotension, unspecified: Secondary | ICD-10-CM

## 2013-04-23 DIAGNOSIS — F250 Schizoaffective disorder, bipolar type: Secondary | ICD-10-CM

## 2013-04-23 DIAGNOSIS — R110 Nausea: Secondary | ICD-10-CM

## 2013-04-23 DIAGNOSIS — Z993 Dependence on wheelchair: Secondary | ICD-10-CM

## 2013-04-23 DIAGNOSIS — R000 Tachycardia, unspecified: Secondary | ICD-10-CM

## 2013-04-23 DIAGNOSIS — R45851 Suicidal ideations: Secondary | ICD-10-CM

## 2013-04-23 DIAGNOSIS — R569 Unspecified convulsions: Secondary | ICD-10-CM | POA: Diagnosis present

## 2013-04-23 DIAGNOSIS — Z6841 Body Mass Index (BMI) 40.0 and over, adult: Secondary | ICD-10-CM

## 2013-04-23 DIAGNOSIS — R0600 Dyspnea, unspecified: Secondary | ICD-10-CM

## 2013-04-23 DIAGNOSIS — F70 Mild intellectual disabilities: Secondary | ICD-10-CM

## 2013-04-23 DIAGNOSIS — J45909 Unspecified asthma, uncomplicated: Secondary | ICD-10-CM

## 2013-04-23 LAB — CBC WITH DIFFERENTIAL/PLATELET
Eosinophils Absolute: 0.1 10*3/uL (ref 0.0–0.7)
Eosinophils Relative: 2 % (ref 0–5)
Hemoglobin: 12.8 g/dL (ref 12.0–15.0)
Lymphs Abs: 2.5 10*3/uL (ref 0.7–4.0)
MCH: 33.2 pg (ref 26.0–34.0)
MCV: 98.7 fL (ref 78.0–100.0)
Monocytes Absolute: 0.5 10*3/uL (ref 0.1–1.0)
Monocytes Relative: 8 % (ref 3–12)
RBC: 3.85 MIL/uL — ABNORMAL LOW (ref 3.87–5.11)

## 2013-04-23 LAB — COMPREHENSIVE METABOLIC PANEL
ALT: 7 U/L (ref 0–35)
AST: 17 U/L (ref 0–37)
Albumin: 2.8 g/dL — ABNORMAL LOW (ref 3.5–5.2)
Calcium: 9.6 mg/dL (ref 8.4–10.5)
Chloride: 102 mEq/L (ref 96–112)
Creatinine, Ser: 0.56 mg/dL (ref 0.50–1.10)
Sodium: 140 mEq/L (ref 135–145)
Total Bilirubin: 0.3 mg/dL (ref 0.3–1.2)

## 2013-04-23 MED ORDER — DIVALPROEX SODIUM ER 250 MG PO TB24
250.0000 mg | ORAL_TABLET | Freq: Every day | ORAL | Status: DC
  Administered 2013-04-23 – 2013-04-25 (×3): 250 mg via ORAL
  Filled 2013-04-23 (×4): qty 1

## 2013-04-23 MED ORDER — PANTOPRAZOLE SODIUM 40 MG PO TBEC
40.0000 mg | DELAYED_RELEASE_TABLET | Freq: Every day | ORAL | Status: DC
  Administered 2013-04-24 – 2013-04-26 (×3): 40 mg via ORAL
  Filled 2013-04-23 (×4): qty 1

## 2013-04-23 MED ORDER — BUDESONIDE 0.25 MG/2ML IN SUSP
0.2500 mg | Freq: Two times a day (BID) | RESPIRATORY_TRACT | Status: DC
  Administered 2013-04-24 – 2013-04-26 (×5): 0.25 mg via RESPIRATORY_TRACT
  Filled 2013-04-23 (×7): qty 2

## 2013-04-23 MED ORDER — RISPERIDONE 0.5 MG PO TABS
0.5000 mg | ORAL_TABLET | Freq: Two times a day (BID) | ORAL | Status: DC
  Administered 2013-04-23 – 2013-04-26 (×6): 0.5 mg via ORAL
  Filled 2013-04-23 (×7): qty 1

## 2013-04-23 MED ORDER — TRAMADOL HCL 50 MG PO TABS
50.0000 mg | ORAL_TABLET | Freq: Four times a day (QID) | ORAL | Status: DC
  Administered 2013-04-23 – 2013-04-26 (×10): 50 mg via ORAL
  Filled 2013-04-23 (×10): qty 1

## 2013-04-23 MED ORDER — ASPIRIN EC 81 MG PO TBEC
81.0000 mg | DELAYED_RELEASE_TABLET | Freq: Every morning | ORAL | Status: DC
  Administered 2013-04-24 – 2013-04-26 (×3): 81 mg via ORAL
  Filled 2013-04-23 (×3): qty 1

## 2013-04-23 MED ORDER — ALBUTEROL SULFATE HFA 108 (90 BASE) MCG/ACT IN AERS: 2.0000 | INHALATION_SPRAY | Freq: Four times a day (QID) | RESPIRATORY_TRACT | Status: DC | PRN

## 2013-04-23 MED ORDER — DEXTROSE 5 % IV SOLN
2.0000 g | Freq: Three times a day (TID) | INTRAVENOUS | Status: DC
  Administered 2013-04-24 – 2013-04-26 (×7): 2 g via INTRAVENOUS
  Filled 2013-04-23 (×11): qty 2

## 2013-04-23 MED ORDER — VANCOMYCIN HCL 10 G IV SOLR
2500.0000 mg | Freq: Once | INTRAVENOUS | Status: AC
  Administered 2013-04-23: 2500 mg via INTRAVENOUS
  Filled 2013-04-23: qty 2500

## 2013-04-23 MED ORDER — VANCOMYCIN HCL 10 G IV SOLR
1250.0000 mg | Freq: Two times a day (BID) | INTRAVENOUS | Status: DC
  Administered 2013-04-24 – 2013-04-26 (×5): 1250 mg via INTRAVENOUS
  Filled 2013-04-23 (×8): qty 1250

## 2013-04-23 MED ORDER — DEXTROSE 5 % IV SOLN
2.0000 g | Freq: Once | INTRAVENOUS | Status: AC
  Administered 2013-04-23: 23:00:00 2 g via INTRAVENOUS
  Filled 2013-04-23: qty 2

## 2013-04-23 MED ORDER — INFLUENZA VAC SPLIT QUAD 0.5 ML IM SUSP
0.5000 mL | INTRAMUSCULAR | Status: AC
  Administered 2013-04-24: 10:00:00 0.5 mL via INTRAMUSCULAR
  Filled 2013-04-23: qty 0.5

## 2013-04-23 MED ORDER — SODIUM CHLORIDE 0.9 % IV SOLN
INTRAVENOUS | Status: DC
  Administered 2013-04-23 – 2013-04-24 (×2): via INTRAVENOUS

## 2013-04-23 MED ORDER — NITROGLYCERIN 0.4 MG SL SUBL: 0.4000 mg | SUBLINGUAL_TABLET | SUBLINGUAL | Status: DC | PRN

## 2013-04-23 MED ORDER — METOPROLOL SUCCINATE ER 50 MG PO TB24
50.0000 mg | ORAL_TABLET | Freq: Every day | ORAL | Status: DC
  Administered 2013-04-23 – 2013-04-26 (×4): 50 mg via ORAL
  Filled 2013-04-23 (×4): qty 1

## 2013-04-23 MED ORDER — GUAIFENESIN ER 600 MG PO TB12
600.0000 mg | ORAL_TABLET | Freq: Two times a day (BID) | ORAL | Status: DC
  Administered 2013-04-23 – 2013-04-26 (×6): 600 mg via ORAL
  Filled 2013-04-23 (×7): qty 1

## 2013-04-23 MED ORDER — LEVOTHYROXINE SODIUM 150 MCG PO TABS
150.0000 ug | ORAL_TABLET | Freq: Every morning | ORAL | Status: DC
  Administered 2013-04-24 – 2013-04-26 (×3): 150 ug via ORAL
  Filled 2013-04-23 (×4): qty 1

## 2013-04-23 MED ORDER — ENOXAPARIN SODIUM 40 MG/0.4ML ~~LOC~~ SOLN
40.0000 mg | SUBCUTANEOUS | Status: DC
  Administered 2013-04-23 – 2013-04-25 (×3): 40 mg via SUBCUTANEOUS
  Filled 2013-04-23 (×4): qty 0.4

## 2013-04-23 NOTE — ED Notes (Signed)
Pt given bed bath.  Pt had extensive amount of stool.  Stool cleaned up and urine washed from pt and placed in new sheets and new gown

## 2013-04-23 NOTE — Consult Note (Addendum)
ANTIBIOTIC CONSULT NOTE - INITIAL  Pharmacy Consult for Vancomycin and Azactam (PCN allergy) Indication: pneumonia  Allergies  Allergen Reactions  . Food Rash    "lasagna"= rash from the noodles  . Penicillins Hives  . Sulfamethoxazole-Trimethoprim Hives and Nausea And Vomiting    Patient Measurements: Height: 5' 4.17" (163 cm) Weight: 294 lb 1.5 oz (133.4 kg) IBW/kg (Calculated) : 55.1  Vital Signs: BP: 142/75 mmHg (11/04 1737) Pulse Rate: 65 (11/04 1738) Intake/Output from previous day:   Intake/Output from this shift:    Labs: No results found for this basename: WBC, HGB, PLT, LABCREA, CREATININE,  in the last 72 hours Estimated Creatinine Clearance: 102 ml/min (by C-G formula based on Cr of 0.64).   Microbiology: No results found for this or any previous visit (from the past 720 hour(s)).  Medical History: Past Medical History  Diagnosis Date  . Schizoaffective disorder   . Urinary tract infection   . Hypertension   . GERD (gastroesophageal reflux disease)   . Hypothyroidism   . Obesity   . Asthma   . COPD (chronic obstructive pulmonary disease)   . CHF (congestive heart failure)   . Seizures   . Morbid obesity   . Chronic pain   . Hypercholesteremia   . Anginal pain   . Myocardial infarction 2002  . Pneumonia     "several times" (05/23/2012)  . Diabetes mellitus     "I'm not diabetic anymore" (05/23/2012)  . Anemia   . History of blood transfusion 1993    "when I had my hysterectomy" (05/23/2012)  . H/O hiatal hernia   . Daily headache   . Arthritis     "severe; all over my body" (05/23/2012)  . Hypoventilation syndrome     [name] 05/30/2012  . Atrial fibrillation   . Atrial flutter, paroxysmal     [name] 05/30/2012  . Exertional dyspnea   . Shortness of breath     "all the time lately" (05/30/2012)   Assessment: 62yo morbidly obese female who was released from [facility] yesterday after her medicaid ran out presents to the ED with cough  and fever for a couple of days. CXR with new right lower lobe opacity concerning for pneumonia. She will begin vancomycin and azactam. Labs have not been drawn yet so will give first doses and then follow up renal function for maintenance doses.  Goal of Therapy: Vancomycin trough 15-20  Plan:  1) Vancomycin 2500mg  IV x 1 2) Azactam 2g IV x 1 3) Follow up renal function for further doses  Fredrik Rigger 04/23/2013,5:52 PM   BMET has resulted. Renal function wnl with sCr 0.56 and CrCl 160ml/min.   Plan: 1) Vancomycin 1250mg  IV q12 2) Azactam 2g IV q8  Fredrik Rigger 04/23/2013, 8:29PM

## 2013-04-23 NOTE — ED Provider Notes (Signed)
Medical screening examination/treatment/procedure(s) were performed by non-physician practitioner and as supervising physician I was immediately available for consultation/collaboration.  EKG Interpretation     Ventricular Rate:    PR Interval:    QRS Duration:   QT Interval:    QTC Calculation:   R Axis:     Text Interpretation:                Layla Maw Jameka Ivie, DO 04/23/13 2114

## 2013-04-23 NOTE — ED Notes (Signed)
Pt is here for CP.  She was released from [facility] after her medicaid ran out yesterday.  Her boyfriend is unable to care for her at home.  Pt is not able to ambulate or care for herself and she states that she "needs help and needs someone to help take care of me".  Pt reports cough and fever for a couple of days.  Pt has mask on on arrival

## 2013-04-23 NOTE — ED Provider Notes (Signed)
CSN: [id number]     Arrival date & time 04/23/13  1501 History   First MD Initiated Contact with Patient 04/23/13 1502     Chief Complaint  Patient presents with  . Chest Pain   (Consider location/radiation/quality/duration/timing/severity/associated sxs/prior Treatment) HPI Comments: Patient is a 62 year old female past medical history significant for hypertension, GERD, hypothyroidism, COPD, congestive heart failure, seizures, schizoaffective disorder, asthma, obesity, chronic pain, diabetes mellitus presenting to the emergency department for 3 days of productive cough with associated central chest tightness w/o radiation. Patient was just discharged from [facility] yesterday afternoon. She denies any recent antibiotic use. She also endorses nausea w/ 1-2 episodes of non-bloody non-bilious vomiting. Denies any alleviating or aggravating factors. Denies any diarrhea or urinary symptoms.   Past Medical History  Diagnosis Date  . Schizoaffective disorder   . Urinary tract infection   . Hypertension   . GERD (gastroesophageal reflux disease)   . Hypothyroidism   . Obesity   . Asthma   . COPD (chronic obstructive pulmonary disease)   . CHF (congestive heart failure)   . Seizures   . Morbid obesity   . Chronic pain   . Hypercholesteremia   . Anginal pain   . Myocardial infarction 2002  . Pneumonia     "several times" (05/23/2012)  . Diabetes mellitus     "I'm not diabetic anymore" (05/23/2012)  . Anemia   . History of blood transfusion 1993    "when I had my hysterectomy" (05/23/2012)  . H/O hiatal hernia   . Daily headache   . Arthritis     "severe; all over my body" (05/23/2012)  . Hypoventilation syndrome     [name] 05/30/2012  . Atrial fibrillation   . Atrial flutter, paroxysmal     [name] 05/30/2012  . Exertional dyspnea   . Shortness of breath     "all the time lately" (05/30/2012)   Past Surgical History  Procedure Laterality Date  . Vaginal hysterectomy   1993  . Tubal ligation  1998  . Cholecystectomy  ?2008  . Cardiac catheterization  2008   Family History  Problem Relation Age of Onset  . Heart disease Father   . Cancer Mother 62    Breast   History  Substance Use Topics  . Smoking status: Former Smoker -- 2.00 packs/day for 12 years    Types: Cigarettes    Quit date: 06/29/2000  . Smokeless tobacco: Current User    Types: Snuff, Chew     Comment: 05/30/2012 "still have what you gave me last time"  . Alcohol Use: No   OB History   Grav Para Term Preterm Abortions TAB SAB Ect Mult Living                 Review of Systems  Constitutional: Positive for fever (subjective).  Respiratory: Positive for cough.   Cardiovascular: Positive for chest pain.  Gastrointestinal: Positive for nausea and vomiting. Negative for abdominal pain, diarrhea and constipation.  All other systems reviewed and are negative.    Allergies  Food; Penicillins; and Sulfamethoxazole-trimethoprim  Home Medications   No current outpatient prescriptions on file. BP 142/72  Pulse 65  Resp 11  Ht 5' 4.17" (1.63 m)  Wt 294 lb 1.5 oz (133.4 kg)  BMI 50.21 kg/m2  SpO2 99% Physical Exam  Constitutional: She is oriented to person, place, and time. She appears well-developed and well-nourished. No distress.  HENT:  Head: Normocephalic and atraumatic.  Right Ear: External ear  normal.  Left Ear: External ear normal.  Nose: Nose normal.  Mouth/Throat: Oropharynx is clear and moist.  Eyes: Conjunctivae are normal. Pupils are equal, round, and reactive to light.  Neck: Neck supple.  Cardiovascular: Normal rate, regular rhythm and normal heart sounds.   Pulmonary/Chest: Effort normal. No respiratory distress. She has no wheezes. She has rales. She exhibits no tenderness.  Abdominal: Soft. Bowel sounds are normal. There is no tenderness.  Musculoskeletal: Normal range of motion. She exhibits no edema.  Neurological: She is alert and oriented to person,  place, and time.  Skin: Skin is warm and dry. She is not diaphoretic.    ED Course  Procedures (including critical care time) Medications  vancomycin (VANCOCIN) 2,500 mg in sodium chloride 0.9 % 500 mL IVPB (2,500 mg Intravenous New Bag/Given 04/23/13 1906)  aztreonam (AZACTAM) 2 g in dextrose 5 % 50 mL IVPB (not administered)  aztreonam (AZACTAM) 2 g in dextrose 5 % 50 mL IVPB (not administered)  vancomycin (VANCOCIN) 1,250 mg in sodium chloride 0.9 % 250 mL IVPB (not administered)    Labs Review Labs Reviewed  COMPREHENSIVE METABOLIC PANEL - Abnormal; Notable for the following:    Albumin 2.8 (*)    All other components within normal limits  CBC WITH DIFFERENTIAL - Abnormal; Notable for the following:    RBC 3.85 (*)    All other components within normal limits  POCT I-STAT TROPONIN I   Imaging Review Dg Chest 2 View  04/23/2013   CLINICAL DATA:  Chest pain, altered mental status  EXAM: CHEST  2 VIEW  COMPARISON:  Prior chest x-ray 02/21/2013  FINDINGS: Stable cardiomegaly with left ventricular prominence. Interval development of a posterior right lower lobe opacity. Negative for pulmonary embolism, pleural effusion or pneumothorax. No acute osseous abnormality. Degenerative changes noted at the left shoulder.  IMPRESSION: New right lower lobe opacity concerning for pneumonia in the appropriate clinical setting.  Stable cardiomegaly with left ventricular prominence.   Electronically Signed   By: Malachy Moan M.D.   On: 04/23/2013 16:47    EKG Interpretation     Ventricular Rate:    PR Interval:    QRS Duration:   QT Interval:    QTC Calculation:   R Axis:     Text Interpretation:             Date: 04/23/2013  Rate: 61  Rhythm: normal sinus rhythm  QRS Axis: right borderline  Intervals: normal  ST/T Wave abnormalities: nonspecific T wave changes  Conduction Disutrbances:none  Narrative Interpretation: Borderline right axis deviation  Old EKG Reviewed:  unchanged    MDM   1. HCAP (healthcare-associated pneumonia)    Afebrile, NAD, non-toxic appearing, AAOx4. I have reviewed nursing notes, vital signs, and all appropriate lab and imaging results for this patient. Vancomycin and Azactam started for HCAP. The patient appears reasonably stabilized for admission considering the current resources, flow, and capabilities available in the ED at this time, and I doubt any other [facility] requiring further screening and/or treatment in the ED prior to admission. Patient d/w with Dr. Elesa Massed, agrees with plan.          Jeannetta Ellis, PA-C 04/23/13 2047

## 2013-04-23 NOTE — Progress Notes (Signed)
Spoke with [facility] re: pt's d/c.  Apparently, [facility] refuses to assign pt a permanent Level 2 mental health clearance number.  Any facility would require pt to have this number so that they could bill and be reimbursed by Medicaid for taking care of pt.  [facility] would be glad to welcome pt back once this permanent number could be secured.  CSW will discuss with department leadership.

## 2013-04-23 NOTE — ED Notes (Signed)
Pt with difficult iv access informed phlebotomy to come and do the blood works.

## 2013-04-23 NOTE — ED Notes (Signed)
Pt is soiled.  Per EMS her entire bed was saturated all the way down to the matress with urine with urine and stool.  Pt reports that she had not been changed or turned or anything since arriving home yesterday.  Pt sheets and EMS stretcher were saturated with waste on arrival.  Her boyfriend is attentive and brought her food and drink but is physically unable to care for morbidly obese patient that is dependent on total care

## 2013-04-23 NOTE — Progress Notes (Signed)
Spoke with Rhae Hammock, SW at [facility], where pt lives.  Pt d/c yesterday from [facility] with HHC arranged by [facility].  Hospital bed and hoyer lift arranged for home, too.  Pt has a roommate/caregiver named [name] who lives with pt but is unable to provide appropriate care to pt.  [facility] would not open up a case on pt today because of her condition/inappropriate care and recommended that she come to the ED.  Per Thurston Hole, SW pt is not safe/suitable to return to an independent living environment at [facility] at d/c. APS has been notified of pt's home situation, per Thurston Hole and willl intervene if pt d/c home.  CSW will follow for placement as appropriate.

## 2013-04-23 NOTE — Progress Notes (Signed)
Clinical Social Work Department BRIEF PSYCHOSOCIAL ASSESSMENT 04/23/2013  Patient:  [name], [name]     Account Number:  [id number]     Admit date:  04/23/2013  Clinical Social Worker:  Illene Silver  Date/Time:  04/23/2013 09:37 PM  Referred by:  CSW  Date Referred:  04/23/2013 Referred for  SNF Placement   Other Referral:   Interview type:  Patient Other interview type:    PSYCHOSOCIAL DATA Living Status:  OTHER Admitted from facility:   Level of care:   Primary support name:  clarence maxwell Primary support relationship to patient:  PARTNER Degree of support available:   Fair.  Mr. Jena Gauss limited in the care he can provide.    CURRENT CONCERNS Current Concerns  Post-Acute Placement   Other Concerns:    SOCIAL WORK ASSESSMENT / PLAN See other CSW epic notes.Spoke with pt who agrees to [facility] if [facility] issues can be worked out.  Pt states that she cannot take care of herself and he boyfriend is not able to provide her enough assistance.  Pt also understands that [facility] will not be seeing her at home because she doen't have appropriate care.  Unit CSW will follow up with [facility] re: permanent Level 2 number so pt can return to [facility] at d/c. Pt prefers the [facility] facility which is an option, per [facility] Admissions Coordinator.   Assessment/plan status:  Psychosocial Support/Ongoing Assessment of Needs Other assessment/ plan:   Information/referral to community resources:    PATIENT'S/FAMILY'S RESPONSE TO PLAN OF CARE: Pt agreeable to [facility]P and appreciative of CSW intervention.

## 2013-04-23 NOTE — ED Provider Notes (Signed)
Date: 04/23/2013 16:14  Rate: 61  Rhythm: normal sinus rhythm  QRS Axis: normal  Intervals: normal  ST/T Wave abnormalities: normal  Conduction Disutrbances: none  Narrative Interpretation: unremarkable; low voltage QRS, nonspecific ST changes in anterior leads      Layla Maw Jaques Mineer, DO 04/23/13 1644

## 2013-04-23 NOTE — H&P (Signed)
[facility] History and Physical  Patient: [name]  ZOX:[id number]  DOB: [birth year]  DOA: 04/23/2013  Referring physician: Dr. ward PCP: [name], MD  Consults:     Chief Complaint: Shortness of breath and cough  HPI: [name] is a 62 y.o. female with Past medical history of GERD, hypothyroidism, COPD, CHF, morbid obesity. She presented today with Cough and fever, sob and pleuritic chest pain. Symptoms has been ongoing since last one day. She denies any sick contact. She had some runny nose before. She denies any rash anywhere. There is no blood in her cough. She has yellowish sputum production. She also mentions that she had 4 episodes of Diarrhea without any blood yesterday. No further episode of bowel movement the she has been passing gas. She denies any nausea or vomiting. She thinks she might have had something to eat which has not adjusted with her. She has been Recent discharge from nursing facility.  Review of Systems: as mentioned in the history of present illness.  A Comprehensive review of the other systems is negative.  Past Medical History  Diagnosis Date  . Schizoaffective disorder   . Urinary tract infection   . Hypertension   . GERD (gastroesophageal reflux disease)   . Hypothyroidism   . Obesity   . Asthma   . COPD (chronic obstructive pulmonary disease)   . CHF (congestive heart failure)   . Seizures   . Morbid obesity   . Chronic pain   . Hypercholesteremia   . Anginal pain   . Myocardial infarction 2002  . Pneumonia     "several times" (05/23/2012)  . Diabetes mellitus     "I'm not diabetic anymore" (05/23/2012)  . Anemia   . History of blood transfusion 1993    "when I had my hysterectomy" (05/23/2012)  . H/O hiatal hernia   . Daily headache   . Arthritis     "severe; all over my body" (05/23/2012)  . Hypoventilation syndrome     [name] 05/30/2012  . Atrial fibrillation   . Atrial flutter, paroxysmal     [name]  05/30/2012  . Exertional dyspnea   . Shortness of breath     "all the time lately" (05/30/2012)   Past Surgical History  Procedure Laterality Date  . Vaginal hysterectomy  1993  . Tubal ligation  1998  . Cholecystectomy  ?2008  . Cardiac catheterization  2008   Social History:  reports that she quit smoking about 12 years ago. Her smoking use included Cigarettes. She has a 24 pack-year smoking history. Her smokeless tobacco use includes Snuff and Chew. She reports that she does not drink alcohol or use illicit drugs. Patient is coming from home. dependent for most of her  ADL.  Allergies  Allergen Reactions  . Food Rash    "lasagna"= rash from the noodles  . Penicillins Hives  . Sulfamethoxazole-Trimethoprim Hives and Nausea And Vomiting    Family History  Problem Relation Age of Onset  . Heart disease Father   . Cancer Mother 82    Breast    Prior to Admission medications   Medication Sig Start Date End Date Taking? Authorizing Provider  albuterol (PROVENTIL HFA;VENTOLIN HFA) 108 (90 BASE) MCG/ACT inhaler Inhale 2 puffs into the lungs every 6 (six) hours as needed for wheezing.   Yes Historical Provider, MD  aspirin EC 81 MG tablet Take 81 mg by mouth every morning.  05/11/12  Yes Neil Mashburn, PA-C  budesonide (PULMICORT) 0.25  MG/2ML nebulizer solution Take 0.25 mg by nebulization 2 (two) times daily.   Yes Historical Provider, MD  divalproex (DEPAKOTE ER) 250 MG 24 hr tablet Take 250 mg by mouth at bedtime.   Yes Historical Provider, MD  levothyroxine (SYNTHROID, LEVOTHROID) 150 MCG tablet Take 150 mcg by mouth every morning. For thyroid disease. 05/11/12  Yes Verne Spurr, PA-C  metoprolol succinate (TOPROL-XL) 50 MG 24 hr tablet Take 50 mg by mouth daily. Take with or immediately following a meal.   Yes Historical Provider, MD  nitroGLYCERIN (NITROSTAT) 0.4 MG SL tablet Place 0.4 mg under the tongue every 5 (five) minutes as needed for chest pain.   Yes Historical  Provider, MD  omeprazole (PRILOSEC) 20 MG capsule Take 20 mg by mouth every morning.  05/11/12  Yes Verne Spurr, PA-C  polyethylene glycol (MIRALAX / GLYCOLAX) packet Take 17 g by mouth 2 (two) times a week. Tuesday and Friday   Yes Historical Provider, MD  risperiDONE (RISPERDAL) 0.5 MG tablet Take 1 tablet (0.5 mg total) by mouth 2 (two) times daily. 03/04/13  Yes [name], MD  traMADol (ULTRAM) 50 MG tablet Take 1 tablet (50 mg total) by mouth 4 (four) times daily. 03/07/13  Yes Kermit Balo, DO    Physical Exam: Filed Vitals:   04/23/13 1737 04/23/13 1738 04/23/13 1744 04/23/13 1942  BP: 142/75   142/72  Pulse:  65    Resp:   20 11  Height:   5' 4.17" (1.63 m)   Weight:   133.4 kg (294 lb 1.5 oz)   SpO2:  99%  99%    General: Alert, Awake and Oriented to Time, Place and Person. Appear in mild distress Eyes: PERRL ENT: Oral Mucosa clear moist. Neck: no JVD, no Carotid Bruits  Cardiovascular: S1 and S2 Present, aortic systolic Murmur, Peripheral Pulses Present Respiratory: Bilateral Air entry equal and Decreased, right-sided Crackles,no wheezes Abdomen: Bowel Sound Present, Soft and Non tender Skin: no Rash Extremities: Bilateral trace Pedal edema, no calf tenderness Neurologic: Grossly Unremarkable.  Labs on Admission:  CBC:  Recent Labs Lab 04/23/13 1600  WBC 6.5  NEUTROABS 3.4  HGB 12.8  HCT 38.0  MCV 98.7  PLT 208    CMP     Component Value Date/Time   NA 140 04/23/2013 1600   K 4.2 04/23/2013 1600   CL 102 04/23/2013 1600   CO2 31 04/23/2013 1600   GLUCOSE 83 04/23/2013 1600   BUN 11 04/23/2013 1600   CREATININE 0.56 04/23/2013 1600   CALCIUM 9.6 04/23/2013 1600   PROT 6.8 04/23/2013 1600   ALBUMIN 2.8* 04/23/2013 1600   AST 17 04/23/2013 1600   ALT 7 04/23/2013 1600   ALKPHOS 63 04/23/2013 1600   BILITOT 0.3 04/23/2013 1600   GFRNONAA >90 04/23/2013 1600   GFRAA >90 04/23/2013 1600    No results found for this basename: LIPASE, AMYLASE,  in the last  168 hours No results found for this basename: AMMONIA,  in the last 168 hours  Cardiac Enzymes: No results found for this basename: CKTOTAL, CKMB, CKMBINDEX, TROPONINI,  in the last 168 hours  BNP (last 3 results)  Recent Labs  05/23/12 0452 07/27/12 1717 08/06/12 1437  PROBNP 53.1 357.4* 80.5    Radiological Exams on Admission: Dg Chest 2 View  04/23/2013   CLINICAL DATA:  Chest pain, altered mental status  EXAM: CHEST  2 VIEW  COMPARISON:  Prior chest x-ray 02/21/2013  FINDINGS: Stable cardiomegaly with left  ventricular prominence. Interval development of a posterior right lower lobe opacity. Negative for pulmonary embolism, pleural effusion or pneumothorax. No acute osseous abnormality. Degenerative changes noted at the left shoulder.  IMPRESSION: New right lower lobe opacity concerning for pneumonia in the appropriate clinical setting.  Stable cardiomegaly with left ventricular prominence.   Electronically Signed   By: Malachy Moan M.D.   On: 04/23/2013 16:47    EKG: Independently reviewed. sinus tachycardia.  Assessment/Plan Principal Problem:   Healthcare-associated pneumonia Active Problems:   HYPOTHYROIDISM NOS   HYPERTENSION, BENIGN ESSENTIAL   ASTHMA   UTI (lower urinary tract infection)    1. Healthcare-associated pneumonia The patient presents with complaint of cough and chest pain which was felt like pleuritic in nature associated with shortness of breath. Her chest x-ray is suggestive of possible right sided infiltrate. With this we would admit her to the hospital for IV antibiotics. Broad-spectrum antibiotic will be given given her history of recent discharge from a nursing facility. Doses will be adjusted by the pharmacy IV fluids will be given. As well as inhalers and nebulizer.  2. History of seizure Continue Depakote  3. Hypothyroidism Check TSH continue Synthroid  4. Hypertension Continue metoprolol Patient Will need rehab placement. We will  discuss with Child psychotherapist.  DVT Prophylaxis: subcutaneous Heparin Nutrition: Cardiac diet  Code Status: Full  Disposition: Admitted to inpatient in med-surge.  Author: Lynden Oxford, MD [facility] Pager: [phone] 04/23/2013, 8:48 PM    If 7PM-7AM, please contact night-coverage [email or web address] Password [email or web address]

## 2013-04-23 NOTE — ED Notes (Signed)
Reminded phlebotomy to do the blood works.

## 2013-04-24 DIAGNOSIS — E039 Hypothyroidism, unspecified: Secondary | ICD-10-CM

## 2013-04-24 LAB — CBC WITH DIFFERENTIAL/PLATELET
Basophils Relative: 0 % (ref 0–1)
Eosinophils Absolute: 0.1 10*3/uL (ref 0.0–0.7)
HCT: 36.1 % (ref 36.0–46.0)
Hemoglobin: 12.2 g/dL (ref 12.0–15.0)
MCH: 33.8 pg (ref 26.0–34.0)
MCHC: 33.8 g/dL (ref 30.0–36.0)
Monocytes Absolute: 0.6 10*3/uL (ref 0.1–1.0)
Monocytes Relative: 9 % (ref 3–12)

## 2013-04-24 LAB — COMPREHENSIVE METABOLIC PANEL
ALT: 6 U/L (ref 0–35)
AST: 11 U/L (ref 0–37)
Albumin: 2.6 g/dL — ABNORMAL LOW (ref 3.5–5.2)
BUN: 12 mg/dL (ref 6–23)
Calcium: 9.2 mg/dL (ref 8.4–10.5)
Sodium: 137 mEq/L (ref 135–145)
Total Bilirubin: 0.5 mg/dL (ref 0.3–1.2)
Total Protein: 6.2 g/dL (ref 6.0–8.3)

## 2013-04-24 IMAGING — CR DG CHEST 1V PORT
1 series · 1 of 1 positions shown · non-contrast
Comparison: 12/17/2011

CLINICAL DATA: Chest pain, shortness of breath

PORTABLE CHEST - 1 VIEW

[AP]
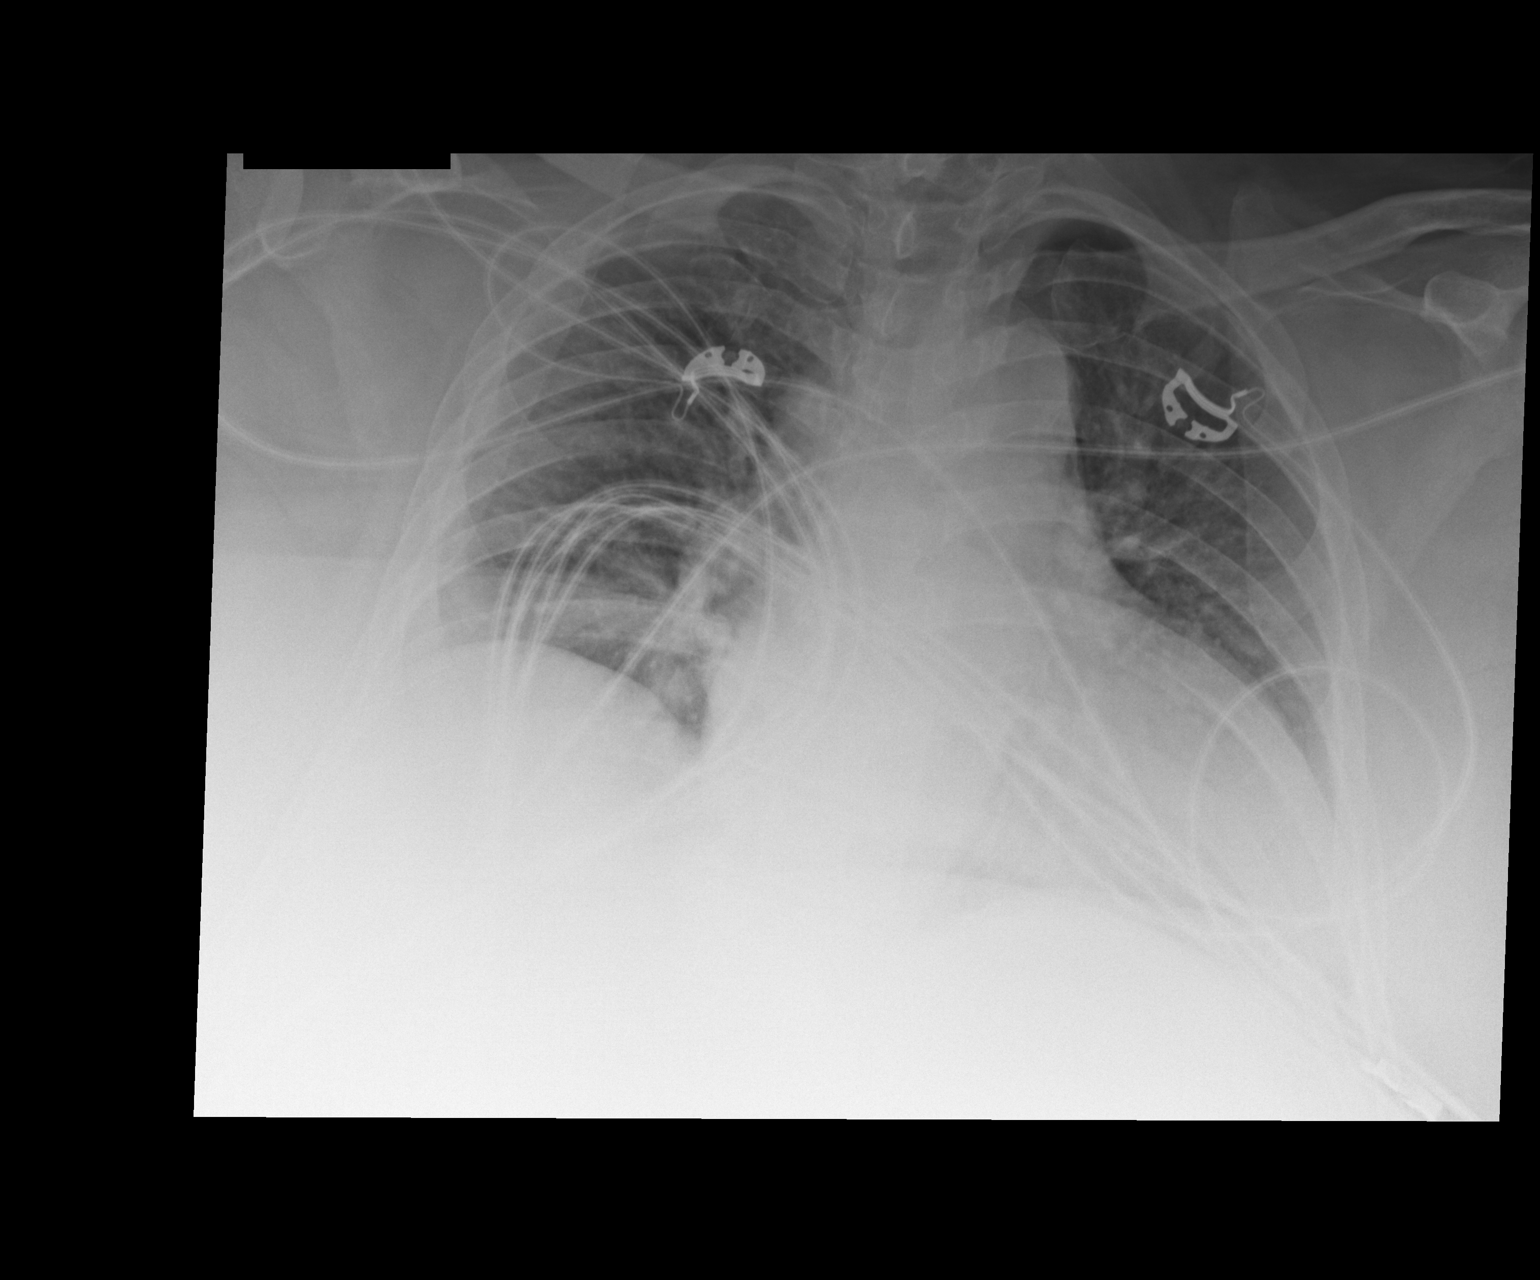

[1 of 1 positions shown; findings below may reference images not displayed]

FINDINGS: Limited exam because of patient body habitus and portable
technique.  Heart is enlarged with vascular congestion and basilar
atelectasis.  Decreased lung volumes evident.  No definite focal
pneumonia, collapse, consolidation, effusion or pneumothorax.
IMPRESSION: Low volume exam with cardiomegaly and vascular congestion

## 2013-04-24 NOTE — Evaluation (Signed)
Physical Therapy Evaluation Patient Details Name: [name] MRN: [id number] DOB: [birth year] Today's Date: 04/24/2013 Time: 4098-1191 PT Time Calculation (min): 27 min  PT Assessment / Plan / Recommendation History of Present Illness  Patient is 62 year old female with past medical history significant for multiple medical problems i.e. hypertension, hypothyroidism, COPD, history of congestive heart failure secondary to diastolic dysfunction in the past, morbid obesity, obesity hypoventilation syndrome/obstructive sleep apnea, history of schizoaffective disorder, history of UTI in the past, and GERD, history of paroxysmal atrial flutter, history of tobacco abuse, history of questionable seizure disorder, recently dc'd from SNF, was home for 1 day, and now presents with inability to manage at home, and pneumonia    Clinical Impression  Pt admitted with above. Pt currently with functional limitations due to the deficits listed below (see PT Problem List).  Pt will benefit from skilled PT to increase their independence and safety with mobility to allow discharge to the venue listed below.       PT Assessment  Patient needs continued PT services    Follow Up Recommendations  SNF    Does the patient have the potential to tolerate intense rehabilitation      Barriers to Discharge        Equipment Recommendations  Other (comment) (TBD at SNF)    Recommendations for Other Services     Frequency Min 2X/week    Precautions / Restrictions Restrictions Weight Bearing Restrictions: No   Pertinent Vitals/Pain Did not rate pain, but pain limiting LE motion and participation in PT      Mobility  Bed Mobility Bed Mobility: Rolling Left  Rolling Left: 1: +2 Total assist Rolling Left: Patient Percentage: 40% Details for Bed Mobility Assistance: unable to complete sup - sit to EOB Transfers Transfers: Not assessed Transfer via Lift Equipment:  (Pt declined using a mechanical lift for  getting OOB) Details for Transfer Assistance: pt unable, pt non ambulatory and states that mechanical lift is used for her mobility    Exercises General Exercises - Upper Extremity Shoulder Flexion: AROM;Both;5 reps Shoulder Horizontal ABduction: AROM;Both;5 reps;Other (comment) (against manual resistence) Shoulder Horizontal ADduction: AROM;Both;5 reps (against manual resistence)   PT Diagnosis: Generalized weakness;Acute pain  PT Problem List: Decreased strength;Decreased range of motion;Decreased activity tolerance;Decreased mobility;Decreased cognition;Decreased knowledge of use of DME;Decreased safety awareness;Decreased knowledge of precautions;Pain;Obesity PT Treatment Interventions: DME instruction;Functional mobility training;Therapeutic activities;Therapeutic exercise;Patient/family education;Cognitive remediation     PT Goals(Current goals can be found in the care plan section) Acute Rehab PT Goals Patient Stated Goal: " I would like to go back to a facility. It's nt safe for me to be at home, my boyfriend can't handle my care " PT Goal Formulation: With patient Time For Goal Achievement: 05/08/13 Potential to Achieve Goals: Good  Visit Information  Last PT Received On: 04/24/13 Assistance Needed: +2 History of Present Illness: Patient is 62 year old female with past medical history significant for multiple medical problems i.e. hypertension, hypothyroidism, COPD, history of congestive heart failure secondary to diastolic dysfunction in the past, morbid obesity, obesity hypoventilation syndrome/obstructive sleep apnea, history of schizoaffective disorder, history of UTI in the past, and GERD, history of paroxysmal atrial flutter, history of tobacco abuse, history of questionable seizure disorder, height is hernia, [employer] signed out AMA approximately one week was brought to the ER by EMS because of inability to ambulate and low-grade fever and incontinence of  feces and urine and was noted to have UTI.  Prior Functioning  Home Living Family/patient expects to be discharged to:: Skilled nursing facility Living Arrangements: Spouse/significant other Prior Function Level of Independence: Needs assistance Gait / Transfers Assistance Needed:  Pt states that mechanical lift is used at SNF for mobility, but that she does not get OOB much ADL's / Homemaking Assistance Needed: total A with ADLs  Communication Communication: No difficulties Dominant Hand: Right    Cognition  Cognition Arousal/Alertness: Awake/alert Behavior During Therapy: WFL for tasks assessed/performed Overall Cognitive Status: Within Functional Limits for tasks assessed    Extremity/Trunk Assessment Upper Extremity Assessment Upper Extremity Assessment: Generalized weakness Lower Extremity Assessment Lower Extremity Assessment: RLE deficits/detail;LLE deficits/detail RLE Deficits / Details: Minimal motion at hips and knees; R LE hip and knee motion, whether AROM or PROM is limited by pain; Qaud set present, thoguh weak; Ankle AROM present, but limited by soft tissue approximation and weakness RLE: Unable to fully assess due to pain RLE Coordination: decreased gross motor LLE Deficits / Details: LLE tending to stay in externally rotated position with knee flexed; Pt states she has knee flexion contracture, and did not allow any PROM to assess LLE:  (Pt declines full assessment)   Balance Balance Balance Assessed: No  End of Session PT - End of Session Activity Tolerance: Patient limited by pain Patient left: in bed;with call bell/phone within reach (in Left semisidelie) Nurse Communication: Mobility status;Need for lift equipment  GP     Van Clines [facility] [name], [name] [phone]  04/24/2013, 4:07 PM

## 2013-04-24 NOTE — Progress Notes (Signed)
Pt admitted to the unit. Pt is alert and oriented. Pt oriented to room, staff, and call bell. Bed in lowest position. Full assessment to Epic. Call bell with in reach. Told to call for assists. Will continue to monitor.  Nam Vossler E  

## 2013-04-24 NOTE — Evaluation (Signed)
Occupational Therapy Evaluation Patient Details Name: [name] MRN: [id number] DOB: 1951/05/19 Today's Date: 04/24/2013 Time: 9562-1308 OT Time Calculation (min): 33 min  OT Assessment / Plan / Recommendation History of present illness Patient is 62 year old female with past medical history significant for multiple medical problems i.e. hypertension, hypothyroidism, COPD, history of congestive heart failure secondary to diastolic dysfunction in the past, morbid obesity, obesity hypoventilation syndrome/obstructive sleep apnea, history of schizoaffective disorder, history of UTI in the past, and GERD, history of paroxysmal atrial flutter, history of tobacco abuse, history of questionable seizure disorder, height is hernia, [employer] signed out AMA approximately one week was brought to the ER by EMS because of inability to ambulate and low-grade fever and incontinence of feces and urine and was noted to have UTI.     Clinical Impression   Pt is total A with ADLs/mobility although pt has UE strength and ROM to assist with UB ADLs. Pt able to feed herself and complete grooming tasks after set up at bed level with HOB raised Pt required total A with ADLs and mobility prior to hospitalization. Pt is at baseline level of function and no further acute OT services indicated at this time. Pt concenrend about her boyfriend being unable to provide adequate/safe care if she returns home and requested that her social worker come speak with her    OT Assessment  Patient does not need any further OT services    Follow Up Recommendations  SNF;Supervision/Assistance - 24 hour    Barriers to Discharge  [place], plan is to d/c to SNF. Pt voiced concerns about going home to where she and her boyfriend live because he cannot provide adequate/safe care for her. Pr requesting social worker come to her room and speak with her about this concern    Equipment Recommendations  None recommended by OT     Recommendations for Other Services    Frequency       Precautions / Restrictions Restrictions Weight Bearing Restrictions: No   Pertinent Vitals/Pain No c/o pain    ADL  Transfers/Ambulation Related to ADLs: total A + 2 for bed mobility, mechanical lift safest option for pt/staff when getting OOB ADL Comments: Pt states that at SNF and at home she has total A/care with ADLs, but that she can feed herself ans complete grooming with set up    OT Diagnosis:    OT Problem List:   OT Treatment Interventions:     OT Goals(Current goals can be found in the care plan section) Acute Rehab OT Goals Patient Stated Goal: " I would like to go back to a facility. It's nt safe for me to be at home, my boyfriend can't handle my care "  Visit Information  Last OT Received On: 04/24/13 Assistance Needed: +2 History of Present Illness: Patient is 62 year old female with past medical history significant for multiple medical problems i.e. hypertension, hypothyroidism, COPD, history of congestive heart failure secondary to diastolic dysfunction in the past, morbid obesity, obesity hypoventilation syndrome/obstructive sleep apnea, history of schizoaffective disorder, history of UTI in the past, and GERD, history of paroxysmal atrial flutter, history of tobacco abuse, history of questionable seizure disorder, height is hernia, [employer] signed out AMA approximately one week was brought to the ER by EMS because of inability to ambulate and low-grade fever and incontinence of feces and urine and was noted to have UTI.         Prior Functioning  Home Living Family/patient expects to be discharged to:: Skilled nursing facility Living Arrangements: Spouse/significant other Prior Function Level of Independence: Needs assistance Gait / Transfers Assistance Needed:  Pt states that mechanical lift is used at SNF for mobility, but that she does not get OOB much ADL's / Homemaking  Assistance Needed: total A with ADLs  Communication Communication: No difficulties Dominant Hand: Right         Vision/Perception Vision - History Baseline Vision: Wears glasses only for reading Patient Visual Report: No change from baseline Perception Perception: Within Functional Limits   Cognition  Cognition Arousal/Alertness: Awake/alert Behavior During Therapy: WFL for tasks assessed/performed Overall Cognitive Status: Within Functional Limits for tasks assessed    Extremity/Trunk Assessment Upper Extremity Assessment Upper Extremity Assessment: Overall WFL for tasks assessed;Generalized weakness Lower Extremity Assessment Lower Extremity Assessment: Defer to PT evaluation     Mobility Bed Mobility Bed Mobility: Rolling Right;Rolling Left;Supine to Sit;Sit to Supine;Scooting to [facility] Rolling Right: 1: +2 Total assist Rolling Left: 1: +2 Total assist Supine to Sit: 1: +2 Total assist Sit to Supine: 1: +2 Total assist Details for Bed Mobility Assistance: unable to complete sup - sit to EOB Transfers Transfers: Not assessed Details for Transfer Assistance: pt unable, pt non ambulatory and states that mechanical lift is used for her mobility     Exercise     Balance Balance Balance Assessed: No   End of Session OT - End of Session Activity Tolerance: Patient limited by fatigue Patient left: in bed Nurse Communication: Mobility status  GO     [name] 04/24/2013, 2:24 PM

## 2013-04-24 NOTE — Progress Notes (Signed)
Pt is refusing to be turned, cleaned up, and refusing care. Pt is refusing to keep her arm straight. RN explained to pt the importance of being turned. Tech caught pt chewing snuff. Pt refuses to let nurse check her bag. rn explained to pt that she cant have tobacco on the property and the she would have to call security to search her things if she refuses to let the nurse look through them. rn will continue to encourage pt to be turned and continue to monitor pt at this time

## 2013-04-24 NOTE — Progress Notes (Signed)
Pt has snuff with her in the bed and refuses to give it to the RN.

## 2013-04-24 NOTE — Progress Notes (Signed)
Patient ID: [name], female   DOB: Sep 24, 1950, 62 y.o.   MRN: [id number]  [facility]  Allergies  Allergen Reactions  . Food Rash    "lasagna"= rash from the noodles  . Penicillins Hives  . Sulfamethoxazole-Trimethoprim Hives and Nausea And Vomiting    Chief Complaint  Patient presents with  . Acute Visit    wound management    HPI  I have been asked to see her for her wound management. She has a small wound on her right buttock which is nearly resolved; the nursing staff is wondering if she could begin using duoderm until her wound resolves. There are no complaints of pain present there are no signs of infection present.   Past Medical History  Diagnosis Date  . Schizoaffective disorder   . Urinary tract infection   . Hypertension   . GERD (gastroesophageal reflux disease)   . Hypothyroidism   . Obesity   . Asthma   . COPD (chronic obstructive pulmonary disease)   . CHF (congestive heart failure)   . Seizures   . Morbid obesity   . Chronic pain   . Hypercholesteremia   . Anginal pain   . Myocardial infarction 2002  . Diabetes mellitus     "I'm not diabetic anymore" (05/23/2012)  . Anemia   . History of blood transfusion 1993    "when I had my hysterectomy" (05/23/2012)  . H/O hiatal hernia   . Daily headache   . Arthritis     "severe; all over my body" (05/23/2012)  . Hypoventilation syndrome     [name] 05/30/2012  . Atrial fibrillation   . Atrial flutter, paroxysmal     [name] 05/30/2012  . Exertional dyspnea   . Shortness of breath     "all the time lately" (05/30/2012)    Past Surgical History  Procedure Laterality Date  . Tubal ligation  1998  . Cholecystectomy  ?2008  . Cardiac catheterization  2008  . Vaginal hysterectomy  1993    Filed Vitals:   10/16/12 2351  BP: 122/80  Pulse: 88  Height: 5\' 2"  (1.575 m)  Weight: 298 lb (135.172 kg)     Medications: Patient's Medications  New Prescriptions   No medications on file  Previous  Medications   ALBUTEROL (PROVENTIL HFA;VENTOLIN HFA) 108 (90 BASE) MCG/ACT INHALER    Inhale 2 puffs into the lungs every 6 (six) hours as needed for wheezing.   ASPIRIN EC 81 MG TABLET    Take 81 mg by mouth every morning.    BUDESONIDE (PULMICORT) 0.25 MG/2ML NEBULIZER SOLUTION    Take 0.25 mg by nebulization 2 (two) times daily.   CEPHALEXIN (KEFLEX) 500 MG CAPSULE    Take 1 capsule (500 mg total) by mouth 4 (four) times daily.   FLUTICASONE (FLONASE) 50 MCG/ACT NASAL SPRAY    Place 2 sprays into the nose daily as needed. For nasal congestion   LEVOFLOXACIN (LEVAQUIN) 750 MG TABLET    Take 1 tablet (750 mg total) by mouth daily.   LEVOTHYROXINE (SYNTHROID, LEVOTHROID) 150 MCG TABLET    Take 150 mcg by mouth every morning. For thyroid disease.   METOPROLOL SUCCINATE (TOPROL-XL) 50 MG 24 HR TABLET    Take 50 mg by mouth daily. Take with or immediately following a meal.   MULTIPLE VITAMIN (MULTIVITAMIN WITH MINERALS) TABS    Take 1 tablet by mouth every morning. For nutritional supplement.   NITROGLYCERIN (NITROSTAT) 0.4 MG SL TABLET    Place  0.4 mg under the tongue every 5 (five) minutes as needed. For chest pain   OMEPRAZOLE (PRILOSEC) 20 MG CAPSULE    Take 20 mg by mouth every morning.    PALIPERIDONE ([name]) 3 MG 24 HR TABLET    Take 3 mg by mouth daily.   POLYETHYLENE GLYCOL (MIRALAX / GLYCOLAX) PACKET    Take 17 g by mouth every 3 (three) days.    TRAMADOL (ULTRAM) 50 MG TABLET    Take 50 mg by mouth 4 (four) times daily.   Modified Medications   No medications on file  Discontinued Medications   No medications on file    Review of Systems  Constitutional: Negative for malaise/fatigue.  Respiratory: Negative for cough.   Cardiovascular: Negative for chest pain.  Gastrointestinal: Negative for abdominal pain.  Skin:       Has sore  Neurological: Negative for headaches.    Physical Exam  Constitutional: She appears well-developed and well-nourished.  Obese   Neck: Neck supple.  No JVD present.  Cardiovascular: Normal rate and regular rhythm.   Respiratory: Effort normal and breath sounds normal.  GI: Soft. Bowel sounds are normal.  Musculoskeletal: She exhibits no edema.  Neurological: She is alert.  Skin: Skin is warm.  Right buttock 2 x 2 x 0.1 cm granulated tissue present; nearly resolved no signs of infection present     ASSESSMENT/PLAN  A. Right buttock ulceration will begin duoderm to wound and change per facility protocol and will monitor her status

## 2013-04-24 NOTE — Progress Notes (Signed)
Security called up to unit. Found pt's snuff in the bedside trashcan. Pt continued to deny that she had it although she was chewing some snuff when security, the tech, and the nurse came in the room. Pt was asked to spit it out and kindly to search her room for any more products that are prohibited while in the hospital. The nurse and security explained to the patient that the use of tobacco is prohibited while on the property and that she isn't permitted to use it. Pt's snuff was removed from the room. Pt was very upset stating to the nurse "I hate you". PT refused to be cleaned up or turned. RN explained to the patient that she couldn't be left in her feces and urine and the importance of being turned. Pt agreed to be cleaned up and still refused to be turned. RN will continue to monitor pt.

## 2013-04-24 NOTE — Progress Notes (Signed)
[facility] PROGRESS NOTE  [name] JYN:[id number] DOB: 01/26/1951 DOA: 04/23/2013 PCP: [name], MD  Assessment/Plan: 62 y.o. 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 HCAP; CXR: New right lower lobe opacity  -afebrile; no leukocytosis; cont IV atx, bronchodilators, oxygen f/u ; blood c/s was not drawn on admit    2. History of seizure Continue Depakote   3. Hypothyroidism last TSH 2.1 (09/2012) continue Synthroid  4. CHF echo (03/2012): LVEF 50%, diastolic dysfunction -clinically euvolemic; cont home regimen   5. H/o CVA bedbound; cotn BP control, ASA; need placement SW involved   6. ? Diarrhea; will check for c diff    Code Status: full Family Communication: none at the bedside (indicate person spoken with, relationship, and if by phone, the number) Disposition Plan: snf   Consultants:  none  Procedures: CXR  Antibiotics:  Aztreonam, vanc 11/4<<<<< (indicate start date, and stop date if known)  HPI/Subjective: alert  Objective: Filed Vitals:   04/24/13 0936  BP: 117/82  Pulse: 63  Temp:   Resp:    No intake or output data in the 24 hours ending 04/24/13 1120 Filed Weights   04/23/13 1744 04/23/13 2130 04/24/13 0627  Weight: 133.4 kg (294 lb 1.5 oz) 140.842 kg (310 lb 8 oz) 142.1 kg (313 lb 4.4 oz)    Exam:   General:  alert  Cardiovascular: s1,s2 rrr  Respiratory: CTA BL   Abdomen: soft, nt ,nd   Musculoskeletal: LE non pittign edema    Data Reviewed: Basic Metabolic Panel:  Recent Labs Lab 04/23/13 1600 04/24/13 0805  NA 140 137  K 4.2 3.8  CL 102 103  CO2 31 29  GLUCOSE 83 91  BUN 11 12  CREATININE 0.56 0.50  CALCIUM 9.6 9.2   Liver Function Tests:  Recent Labs Lab 04/23/13 1600 04/24/13 0805  AST 17 11  ALT 7 6   ALKPHOS 63 61  BILITOT 0.3 0.5  PROT 6.8 6.2  ALBUMIN 2.8* 2.6*   No results found for this basename: LIPASE, AMYLASE,  in the last 168 hours No results found for this basename: AMMONIA,  in the last 168 hours CBC:  Recent Labs Lab 04/23/13 1600 04/24/13 0805  WBC 6.5 6.6  NEUTROABS 3.4 3.6  HGB 12.8 12.2  HCT 38.0 36.1  MCV 98.7 100.0  PLT 208 192   Cardiac Enzymes: No results found for this basename: CKTOTAL, CKMB, CKMBINDEX, TROPONINI,  in the last 168 hours BNP (last 3 results)  Recent Labs  05/23/12 0452 07/27/12 1717 08/06/12 1437  PROBNP 53.1 357.4* 80.5   CBG: No results found for this basename: GLUCAP,  in the last 168 hours  Recent Results (from the past 240 hour(s))  MRSA PCR SCREENING     Status: Abnormal   Collection Time    04/23/13 11:34 PM      Result Value Range Status   MRSA by PCR POSITIVE (*) NEGATIVE Final   Comment:            The GeneXpert MRSA Assay (FDA     approved for NASAL specimens     only), is one component of a     comprehensive MRSA colonization     surveillance program. It is not     intended to diagnose MRSA  infection nor to guide or     monitor treatment for     MRSA infections.     RESULT CALLED TO, READ BACK BY AND VERIFIED WITH:     CALLED TO TY SULLIVAN @0350  [id number] THANEY     Studies: Dg Chest 2 View  04/23/2013   CLINICAL DATA:  Chest pain, altered mental status  EXAM: CHEST  2 VIEW  COMPARISON:  Prior chest x-ray 02/21/2013  FINDINGS: Stable cardiomegaly with left ventricular prominence. Interval development of a posterior right lower lobe opacity. Negative for pulmonary embolism, pleural effusion or pneumothorax. No acute osseous abnormality. Degenerative changes noted at the left shoulder.  IMPRESSION: New right lower lobe opacity concerning for pneumonia in the appropriate clinical setting.  Stable cardiomegaly with left ventricular prominence.   Electronically Signed   By: Malachy Moan M.D.   On:  04/23/2013 16:47    Scheduled Meds: . aspirin EC  81 mg Oral q morning - 10a  . aztreonam  2 g Intravenous Q8H  . budesonide  0.25 mg Nebulization BID  . divalproex  250 mg Oral QHS  . enoxaparin (LOVENOX) injection  40 mg Subcutaneous Q24H  . guaiFENesin  600 mg Oral BID  . levothyroxine  150 mcg Oral q morning - 10a  . metoprolol succinate  50 mg Oral Daily  . pantoprazole  40 mg Oral Daily  . risperiDONE  0.5 mg Oral BID  . traMADol  50 mg Oral QID  . vancomycin  1,250 mg Intravenous Q12H   Continuous Infusions: . sodium chloride 75 mL/hr at 04/24/13 0935    Principal Problem:   Healthcare-associated pneumonia Active Problems:   HYPOTHYROIDISM NOS   HYPERTENSION, BENIGN ESSENTIAL   ASTHMA    Time spent: >35 minutes     Esperanza Sheets  [facility] Pager [phone]. If 7PM-7AM, please contact night-coverage at [email or web address], password [facility] 04/24/2013, 11:20 AM  LOS: 1 day

## 2013-04-24 NOTE — Clinical Social Work Note (Signed)
New PASRR screening submitted for patient. Patient is going to require a Level II PASRR. [facility] will not accept patient without a permanent Level II PASRR.   Roddie Mc, [place], [place], [phone]

## 2013-04-24 NOTE — Care Management Note (Signed)
    Page 1 of 1   04/26/2013     2:28:12 PM   CARE MANAGEMENT NOTE 04/26/2013  Patient:  [name], [name]   Account Number:  [id number]  Date Initiated:  04/24/2013  Documentation initiated by:  Letha Cape  Subjective/Objective Assessment:   dx pna  admit- was recently at [facility] living snf. lives with boyfriend.     Action/Plan:   pt/ot eval   Anticipated DC Date:  04/26/2013   Anticipated DC Plan:  SKILLED NURSING FACILITY  In-house referral  Clinical Social Worker      DC Planning Services  CM consult      Choice offered to / List presented to:             Status of service:  Completed, signed off Medicare Important Message given?   (If response is "NO", the following Medicare IM given date fields will be blank) Date Medicare IM given:   Date Additional Medicare IM given:    Discharge Disposition:  SKILLED NURSING FACILITY  Per UR Regulation:  Reviewed for med. necessity/level of care/duration of stay  If discussed at Long Length of Stay Meetings, dates discussed:    Comments:  04/26/13 14:27 Letha Cape RN,BSN [phone] patient is for dc to [facility] SNF today, CSW following.

## 2013-04-25 DIAGNOSIS — I10 Essential (primary) hypertension: Secondary | ICD-10-CM

## 2013-04-25 LAB — CBC
HCT: 35.1 % — ABNORMAL LOW (ref 36.0–46.0)
Hemoglobin: 12 g/dL (ref 12.0–15.0)
MCV: 99.4 fL (ref 78.0–100.0)
RDW: 14.5 % (ref 11.5–15.5)
WBC: 5.3 10*3/uL (ref 4.0–10.5)

## 2013-04-25 MED ORDER — CHLORHEXIDINE GLUCONATE CLOTH 2 % EX PADS
6.0000 | MEDICATED_PAD | Freq: Every day | CUTANEOUS | Status: DC
  Administered 2013-04-26: 06:00:00 6 via TOPICAL

## 2013-04-25 MED ORDER — MUPIROCIN 2 % EX OINT
1.0000 "application " | TOPICAL_OINTMENT | Freq: Two times a day (BID) | CUTANEOUS | Status: DC
  Administered 2013-04-25: 1 via NASAL

## 2013-04-25 NOTE — Progress Notes (Signed)
When chaplain entered pt's room pt was awake. Chaplain engaged in empathic listening and conversation with pt.  Chaplain had prayer with pt.     04/25/13 1400  Clinical Encounter Type  Visited With Patient  Visit Type Initial;Psychological support;Spiritual support  Referral From Nurse  Spiritual Encounters  Spiritual Needs Prayer    Rulon Abide

## 2013-04-25 NOTE — Progress Notes (Signed)
[facility] PROGRESS NOTE  [name] ZOX:[id number] DOB: Dec 20, 1950 DOA: 04/23/2013 PCP: [name], MD  Assessment/Plan: 62 y.o. 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 HCAP; CXR: New right lower lobe opacity  -afebrile; no leukocytosis; cont IV atx, bronchodilators, oxygen f/u ; blood c/s was not drawn on admit    2. History of seizure Continue Depakote   3. Hypothyroidism last TSH 2.1 (09/2012) continue Synthroid  4. CHF echo (03/2012): LVEF 50%, diastolic dysfunction -clinically euvolemic; cont home regimen   5. H/o CVA bedbound; cont BP control, ASA; need placement SW involved   6. ? Diarrhea improving; c diff  Negative    Code Status: full Family Communication: none at the bedside (indicate person spoken with, relationship, and if by phone, the number) Disposition Plan: snf likely in AM    Consultants:  none  Procedures: CXR  Antibiotics:  Aztreonam, vanc 11/4<<<<< (indicate start date, and stop date if known)  HPI/Subjective: alert  Objective: Filed Vitals:   04/24/13 2026  BP: 121/78  Pulse: 64  Temp: 98.1 F (36.7 C)  Resp: 17    Intake/Output Summary (Last 24 hours) at 04/25/13 1039 Last data filed at 04/24/13 2100  Gross per 24 hour  Intake   1200 ml  Output    200 ml  Net   1000 ml   Filed Weights   04/23/13 1744 04/23/13 2130 04/24/13 0627  Weight: 133.4 kg (294 lb 1.5 oz) 140.842 kg (310 lb 8 oz) 142.1 kg (313 lb 4.4 oz)    Exam:   General:  alert  Cardiovascular: s1,s2 rrr  Respiratory: CTA BL   Abdomen: soft, nt ,nd   Musculoskeletal: LE non pittign edema    Data Reviewed: Basic Metabolic Panel:  Recent Labs Lab 04/23/13 1600 04/24/13 0805  NA 140 137  K 4.2 3.8  CL 102 103  CO2 31 29  GLUCOSE 83 91  BUN 11 12   CREATININE 0.56 0.50  CALCIUM 9.6 9.2   Liver Function Tests:  Recent Labs Lab 04/23/13 1600 04/24/13 0805  AST 17 11  ALT 7 6  ALKPHOS 63 61  BILITOT 0.3 0.5  PROT 6.8 6.2  ALBUMIN 2.8* 2.6*   No results found for this basename: LIPASE, AMYLASE,  in the last 168 hours No results found for this basename: AMMONIA,  in the last 168 hours CBC:  Recent Labs Lab 04/23/13 1600 04/24/13 0805 04/25/13 0510  WBC 6.5 6.6 5.3  NEUTROABS 3.4 3.6  --   HGB 12.8 12.2 12.0  HCT 38.0 36.1 35.1*  MCV 98.7 100.0 99.4  PLT 208 192 194   Cardiac Enzymes: No results found for this basename: CKTOTAL, CKMB, CKMBINDEX, TROPONINI,  in the last 168 hours BNP (last 3 results)  Recent Labs  05/23/12 0452 07/27/12 1717 08/06/12 1437  PROBNP 53.1 357.4* 80.5   CBG: No results found for this basename: GLUCAP,  in the last 168 hours  Recent Results (from the past 240 hour(s))  MRSA PCR SCREENING     Status: Abnormal   Collection Time    04/23/13 11:34 PM      Result Value Range Status   MRSA by PCR POSITIVE (*) NEGATIVE Final   Comment:            The GeneXpert MRSA Assay (FDA  approved for NASAL specimens     only), is one component of a     comprehensive MRSA colonization     surveillance program. It is not     intended to diagnose MRSA     infection nor to guide or     monitor treatment for     MRSA infections.     RESULT CALLED TO, READ BACK BY AND VERIFIED WITH:     CALLED TO TY SULLIVAN @0350  [id number] THANEY  CLOSTRIDIUM DIFFICILE BY PCR     Status: None   Collection Time    04/24/13 11:34 AM      Result Value Range Status   C difficile by pcr NEGATIVE  NEGATIVE Final     Studies: Dg Chest 2 View  04/23/2013   CLINICAL DATA:  Chest pain, altered mental status  EXAM: CHEST  2 VIEW  COMPARISON:  Prior chest x-ray 02/21/2013  FINDINGS: Stable cardiomegaly with left ventricular prominence. Interval development of a posterior right lower lobe opacity. Negative for  pulmonary embolism, pleural effusion or pneumothorax. No acute osseous abnormality. Degenerative changes noted at the left shoulder.  IMPRESSION: New right lower lobe opacity concerning for pneumonia in the appropriate clinical setting.  Stable cardiomegaly with left ventricular prominence.   Electronically Signed   By: Malachy Moan M.D.   On: 04/23/2013 16:47    Scheduled Meds: . aspirin EC  81 mg Oral q morning - 10a  . aztreonam  2 g Intravenous Q8H  . budesonide  0.25 mg Nebulization BID  . divalproex  250 mg Oral QHS  . enoxaparin (LOVENOX) injection  40 mg Subcutaneous Q24H  . guaiFENesin  600 mg Oral BID  . levothyroxine  150 mcg Oral q morning - 10a  . metoprolol succinate  50 mg Oral Daily  . pantoprazole  40 mg Oral Daily  . risperiDONE  0.5 mg Oral BID  . traMADol  50 mg Oral QID  . vancomycin  1,250 mg Intravenous Q12H   Continuous Infusions:    Principal Problem:   Healthcare-associated pneumonia Active Problems:   HYPOTHYROIDISM NOS   HYPERTENSION, BENIGN ESSENTIAL   ASTHMA    Time spent: >35 minutes     Esperanza Sheets  [facility] Pager [phone]. If 7PM-7AM, please contact night-coverage at [email or web address], password [facility] 04/25/2013, 10:39 AM  LOS: 2 days

## 2013-04-26 DIAGNOSIS — D649 Anemia, unspecified: Secondary | ICD-10-CM

## 2013-04-26 MED ORDER — LEVOFLOXACIN 750 MG PO TABS: 750.0000 mg | ORAL_TABLET | Freq: Every day | ORAL | Status: DC

## 2013-04-26 MED ORDER — RISPERIDONE 0.5 MG PO TABS: 0.5000 mg | ORAL_TABLET | Freq: Two times a day (BID) | ORAL | Status: DC

## 2013-04-26 MED ORDER — MUPIROCIN 2 % EX OINT: 1.0000 "application " | TOPICAL_OINTMENT | Freq: Two times a day (BID) | CUTANEOUS | Status: DC

## 2013-04-26 MED ORDER — FUROSEMIDE 20 MG PO TABS: 20.0000 mg | ORAL_TABLET | ORAL | Status: DC | PRN

## 2013-04-26 MED ORDER — TRAMADOL HCL 50 MG PO TABS: 50.0000 mg | ORAL_TABLET | Freq: Four times a day (QID) | ORAL | Status: DC

## 2013-04-26 NOTE — Clinical Social Work Placement (Signed)
Clinical Social Work Department CLINICAL SOCIAL WORK PLACEMENT NOTE 04/26/2013  Patient:  AMBUR, PROVINCE  Account Number:  [id number] Admit date:  04/23/2013  Clinical Social Worker:  Cherre Blanc, [place]  Date/time:  04/26/2013 04:00 PM  Clinical Social Work is seeking post-discharge placement for this patient at the following level of care:   SKILLED NURSING   (*CSW will update this form in Epic as items are completed)   04/26/2013  Patient/family provided with [facility] Department of Clinical Social Work's list of facilities offering this level of care within the geographic area requested by the patient (or if unable, by the patient's family).  04/26/2013  Patient/family informed of their freedom to choose among providers that offer the needed level of care, that participate in Medicare, Medicaid or managed care program needed by the patient, have an available bed and are willing to accept the patient.  04/26/2013  Patient/family informed of [facility]' ownership interest in [facility], as well as of the fact that they are under no obligation to receive care at this facility.  PASARR submitted to EDS on 04/25/2013 PASARR number received from EDS on 04/25/2013  FL2 transmitted to all facilities in geographic area requested by pt/family on  04/26/2013 FL2 transmitted to all facilities within larger geographic area on   Patient informed that his/her managed care company has contracts with or will negotiate with  certain facilities, including the following:     Patient/family informed of bed offers received:  04/26/2013 Patient chooses bed at [facility] Physician recommends and patient chooses bed at    Patient to be transferred to [facility] on  04/26/2013 Patient to be transferred to facility by Ambulance  The following physician request were entered in Epic:   Additional Comments: Patient initially wanted to be DC to  [facility], but [facility] unable to accept patient. Patient was offered choice between [facility] and [place]. Patient chose [place]. Per MD patient ready for DC on 04/26/13. Patient DC to [place] on 04/26/13. RN, patient, facility notified of DC. RN given number for report. Ambulance transport requested for patient. DC packet left on patient's chart. CSW signing off.   Roddie Mc, [place], [place], [phone]

## 2013-04-26 NOTE — Discharge Summary (Signed)
Physician Discharge Summary  [name] HQI:[id number] DOB: 10-26-1950 DOA: 04/23/2013  PCP: [name], MD  Admit date: 04/23/2013 Discharge date: 04/26/2013  Time spent: >35 minutes  Recommendations for Outpatient Follow-up:  F/u with PCP in 1-2 weeks   Discharge Diagnoses:  Principal Problem:   Healthcare-associated pneumonia Active Problems:   HYPOTHYROIDISM NOS   HYPERTENSION, BENIGN ESSENTIAL   ASTHMA   Discharge Condition: stable   Diet recommendation: heart healthy   Filed Weights   04/23/13 2130 04/24/13 0627 04/26/13 0455  Weight: 140.842 kg (310 lb 8 oz) 142.1 kg (313 lb 4.4 oz) 139.7 kg (307 lb 15.7 oz)    History of present illness:  62 y.o. 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 HCAP; CXR: New right lower lobe opacity; afebrile; no leukocytosis; improved on cont IV atx, bronchodilators, oxygen f/u; to complete outpatient atx treatment  2. History of seizure Continue Depakote  3. Hypothyroidism last TSH 2.1 (09/2012) continue Synthroid  4. CHF echo (03/2012): LVEF 50%, diastolic dysfunction; LE edema likely more dependant due to h/o CVA non a,bulatory  -clinically euvolemic not on diuretics; cont home regimen; prn lasix  5. H/o CVA bedbound; cont BP control, ASA; need placement SW involved  6. Diarrhea resolved; c diff Negative      Procedures:  CXR (i.e. Studies not automatically included, echos, thoracentesis, etc; not x-rays)  Consultations:  None   Discharge Exam: Filed Vitals:   04/26/13 0455  BP: 129/80  Pulse: 68  Temp: 98.8 F (37.1 C)  Resp: 18    General: alert Cardiovascular: s1,s2 rrr Respiratory: CTA BL  Discharge Instructions  Discharge Orders   Future Appointments Provider Department Dept Phone   05/08/2013 4:00 PM Marlowe Aschoff, DPM [facility] [phone]   Future Orders Complete By Expires   Diet - low sodium heart healthy  As directed    Discharge instructions  As directed    Comments:     Please follow up with primary care doctor in 1-2 weeks   Increase activity slowly  As directed        Medication List         albuterol 108 (90 BASE) MCG/ACT inhaler  Commonly known as:  PROVENTIL HFA;VENTOLIN HFA  Inhale 2 puffs into the lungs every 6 (six) hours as needed for wheezing.     aspirin EC 81 MG tablet  Take 81 mg by mouth every morning.     budesonide 0.25 MG/2ML nebulizer solution  Commonly known as:  PULMICORT  Take 0.25 mg by nebulization 2 (two) times daily.     divalproex 250 MG 24 hr tablet  Commonly known as:  DEPAKOTE ER  Take 250 mg by mouth at bedtime.     levofloxacin 750 MG tablet  Commonly known as:  LEVAQUIN  Take 1 tablet (750 mg total) by mouth daily.     levothyroxine 150 MCG tablet  Commonly known as:  SYNTHROID, LEVOTHROID  Take 150 mcg by mouth every morning. For thyroid disease.     metoprolol succinate 50 MG 24 hr tablet  Commonly known as:  TOPROL-XL  Take 50 mg by mouth daily. Take with or immediately following a meal.     mupirocin ointment 2 %  Commonly known as:  [employer]  1 application into the nose 2 (two) times daily.     nitroGLYCERIN 0.4 MG SL tablet  Commonly known as:  NITROSTAT  Place 0.4 mg under the tongue every 5 (five) minutes as needed for chest pain.     omeprazole 20 MG capsule  Commonly known as:  PRILOSEC  Take 20 mg by mouth every morning.     polyethylene glycol packet  Commonly known as:  MIRALAX / GLYCOLAX  Take 17 g by mouth 2 (two) times a week. Tuesday and Friday     risperiDONE 0.5 MG tablet  Commonly known as:  RISPERDAL  Take 1 tablet (0.5 mg total) by mouth 2 (two) times daily.     traMADol 50 MG tablet  Commonly known as:  ULTRAM  Take 1 tablet (50 mg total)  by mouth 4 (four) times daily.       Allergies  Allergen Reactions  . Food Rash    "lasagna"= rash from the noodles  . Penicillins Hives  . Sulfamethoxazole-Trimethoprim Hives and Nausea And Vomiting       Follow-up Information   Follow up with [name], MD In 1 week.   Specialty:  Cardiology   Contact information:   [address] [place] [address] [phone]        The results of significant diagnostics from this hospitalization (including imaging, microbiology, ancillary and laboratory) are listed below for reference.    Significant Diagnostic Studies: Dg Chest 2 View  04/23/2013   CLINICAL DATA:  Chest pain, altered mental status  EXAM: CHEST  2 VIEW  COMPARISON:  Prior chest x-ray 02/21/2013  FINDINGS: Stable cardiomegaly with left ventricular prominence. Interval development of a posterior right lower lobe opacity. Negative for pulmonary embolism, pleural effusion or pneumothorax. No acute osseous abnormality. Degenerative changes noted at the left shoulder.  IMPRESSION: New right lower lobe opacity concerning for pneumonia in the appropriate clinical setting.  Stable cardiomegaly with left ventricular prominence.   Electronically Signed   By: Malachy Moan M.D.   On: 04/23/2013 16:47    Microbiology: Recent Results (from the past 240 hour(s))  MRSA PCR SCREENING     Status: Abnormal   Collection Time    04/23/13 11:34 PM      Result Value Range Status   MRSA by PCR POSITIVE (*) NEGATIVE Final   Comment:            The GeneXpert MRSA Assay (FDA     approved for NASAL specimens     only), is one component of a     comprehensive MRSA colonization     surveillance program. It is not     intended to diagnose MRSA     infection nor to guide or     monitor treatment for     MRSA infections.     RESULT CALLED TO, READ BACK BY AND VERIFIED WITH:     CALLED TO TY SULLIVAN @0350  [id number] THANEY  CLOSTRIDIUM DIFFICILE BY PCR     Status:  None   Collection Time    04/24/13 11:34 AM      Result Value Range Status   C difficile by pcr NEGATIVE  NEGATIVE Final     Labs: Basic Metabolic Panel:  Recent Labs Lab 04/23/13 1600 04/24/13 0805  NA 140 137  K 4.2 3.8  CL 102 103  CO2 31 29  GLUCOSE 83 91  BUN 11 12  CREATININE 0.56 0.50  CALCIUM 9.6 9.2  Liver Function Tests:  Recent Labs Lab 04/23/13 1600 04/24/13 0805  AST 17 11  ALT 7 6  ALKPHOS 63 61  BILITOT 0.3 0.5  PROT 6.8 6.2  ALBUMIN 2.8* 2.6*   No results found for this basename: LIPASE, AMYLASE,  in the last 168 hours No results found for this basename: AMMONIA,  in the last 168 hours CBC:  Recent Labs Lab 04/23/13 1600 04/24/13 0805 04/25/13 0510  WBC 6.5 6.6 5.3  NEUTROABS 3.4 3.6  --   HGB 12.8 12.2 12.0  HCT 38.0 36.1 35.1*  MCV 98.7 100.0 99.4  PLT 208 192 194   Cardiac Enzymes: No results found for this basename: CKTOTAL, CKMB, CKMBINDEX, TROPONINI,  in the last 168 hours BNP: BNP (last 3 results)  Recent Labs  05/23/12 0452 07/27/12 1717 08/06/12 1437  PROBNP 53.1 357.4* 80.5   CBG: No results found for this basename: GLUCAP,  in the last 168 hours     Signed:  Jonette Mate N  [facility] 04/26/2013, 10:22 AM

## 2013-04-29 DIAGNOSIS — N390 Urinary tract infection, site not specified: Secondary | ICD-10-CM | POA: Insufficient documentation

## 2013-04-30 ENCOUNTER — Non-Acute Institutional Stay (SKILLED_NURSING_FACILITY): Payer: Medicare Other | Admitting: Internal Medicine

## 2013-04-30 DIAGNOSIS — J449 Chronic obstructive pulmonary disease, unspecified: Secondary | ICD-10-CM

## 2013-04-30 DIAGNOSIS — J189 Pneumonia, unspecified organism: Secondary | ICD-10-CM

## 2013-04-30 DIAGNOSIS — F250 Schizoaffective disorder, bipolar type: Secondary | ICD-10-CM

## 2013-04-30 DIAGNOSIS — K219 Gastro-esophageal reflux disease without esophagitis: Secondary | ICD-10-CM

## 2013-04-30 DIAGNOSIS — F259 Schizoaffective disorder, unspecified: Secondary | ICD-10-CM

## 2013-04-30 NOTE — Progress Notes (Signed)
Patient ID: [name], female   DOB: [birth year], 62 y.o.   MRN: [id number]  Facility; [facility] Chief complaint; admission to SNF post admit to [facility] from November 4 to November 7  History;  This is a 62 year old morbidly obese woman who spent 8 months since [facility] earlier this year under the care of Dr. Renato Gails. The patient apparently left [facility] to live with a boyfriend however the tone of Dr. Norvel Richards notes make this sound like a poor choice. She was seen at least once in followup in our office. She was admitted this time with healthcare acquired pneumonia with a new right lower lobe opacity. She was afebrile without leukocytosis but treated with IV antibiotics bronchodilators and oxygen. She appears to have made a good recovery. The patient is essentially bedbound has a contracted left leg at the knee will that his eczema rotated at this time I really don't have a wonderful explanation for the she also has schizoaffective disorder.  Past Medical History  Diagnosis Date  . Schizoaffective disorder   . Urinary tract infection   . Hypertension   . GERD (gastroesophageal reflux disease)   . Hypothyroidism   . Obesity   . Asthma   . COPD (chronic obstructive pulmonary disease)   . CHF (congestive heart failure)   . Seizures   . Morbid obesity   . Chronic pain   . Hypercholesteremia   . Anginal pain   . Myocardial infarction 2002  . Diabetes mellitus     "I'm not diabetic anymore" (05/23/2012)  . Anemia   . History of blood transfusion 1993    "when I had my hysterectomy" (05/23/2012)  . H/O hiatal hernia   . Daily headache   . Arthritis     "severe; all over my body" (05/23/2012)  . Hypoventilation syndrome     [name] 05/30/2012  . Atrial fibrillation   . Atrial flutter, paroxysmal     [name] 05/30/2012  . Exertional dyspnea   . Shortness of breath     "all the time lately" (05/30/2012)  . Pneumonia     "several times; including now" (04/23/2013)   Past  Surgical History  Procedure Laterality Date  . Tubal ligation  1998  . Cholecystectomy  ?2008  . Cardiac catheterization  2008  . Vaginal hysterectomy  1993     Medications; Nitrostat sublingual when necessary, Depakote 250 mg by mouth each bedtime Pulmicort nebulizer 0.25 mg twice a day, profundal 2 puffs every 6 when necessary, nicotine patch 21 mg topically, Prilosec 20 daily, ASA 81 daily, Levaquin 750 mg a day, Synthroid 150 daily, Toprolol XL 50 mg daily, tramadol 50 mg by mouth 4 times a day when necessary Risperdal 0.5 twice a day. MiraLax 17 g on Tuesdays and Fridays. Bactroban 2% application of the nose twice a day for 5 days  Socially; as I understand things the patient was living with a boyfriend at home. Her functional status is unclear although in several places and says that she is bedbound.   reports that she quit smoking about 12 years ago. Her smoking use included Cigarettes. She has a 24 pack-year smoking history. Her smokeless tobacco use includes Chew. She reports that she does not drink alcohol or use illicit drugs.  family history includes Cancer (age of onset: 3) in her mother; Heart disease in her father.  Review of systems Respiratory; patient states her breathing is a lot better no cough or shortness of breath [facility] cardiac no exertional chest pain  or palpitations GI no abdominal pain no diarrhea  Physical examination Gen.; morbidly obese woman lying flat in bed watching television. She tells me she does not like to get up although I'm up unable to determine a reason why O2 sat is 93% on room air respirations 20 pulse rate 87 HEENT she is a [place] says she has dentures at home Respiratory very poor air entry bilaterally no wheezing is noted Cardiac heart sounds are distant no murmurs no gallops no increase in diarrhea as pressure she appears to be euvolemic Abdomen morbidly obese with purple striations on her flanks. No masses are noted no  tenderness. Neurologically upper extremity strength and tone is intact she has no pronator drift the. Her left leg is contracted at the knee and eczema rotated at the hip. She is able to move her look her left foot. She does not have antigravity strength in the right leg. Mental status; she appears to be cognitively intact her mood was stable engaged with me in a friendly fashion I did not see her schizoaffective disease as being bad out of control although I note in the past she has been on [place].  Impression/plan #1 ammonia I think she should complete another few days of Levaquin otherwise she appears to be stable per #2 seizure disorder on Depakote #3 COPD/asthma this does not appear to be that active #4 morbid obesity would wonder about sleep apnea #5 congestive heart failure with an echocardiogram in 2013 showing ejection fraction of 50% with diastolic dysfunction. She has no evidence of heart failure currently #6 history of CVA with resultant left leg weakness. Her left leg weakness was the only thing I was really able to detect which is a bit unusual we'll need to look at her neuroimaging studies at some point. #7 hypothyroidism on replacement #8 hypertension with secondary heart disease  This is an essentially bedbound patient with a multitude of medical issues although she does not look to be unstable. Notes that accompanied her the patient makes a habit of discharging with a boyfriend goal "home" although I am not at all certain about the situation. Apparently does not last long fortunately her psychiatric status does not appear to be that unstable  Basic Metabolic Panel:   Recent Labs   03/29/12 1140    06/02/12 1708  06/03/12 0605    07/30/12 0440    02/21/13 1945  02/22/13 0450  02/23/13 0552   NA  134*   < >  137  140   < >   --    < >  139  137  136   K  5.0   < >  4.5  4.5   < >   --    < >  3.8  3.3*  3.9   CL  95*   < >  100  100   < >   --    < >  99  101  102   CO2   30   < >  30  31   < >   --    < >  27  26  25    GLUCOSE  104*   < >  101*  97   < >   --    < >  91  93  106*   BUN  19   < >  13  10   < >   --    < >  9  8  7   CREATININE  2.20*   < >  1.19*  1.00   < >   --    < >  0.59  0.54  0.64   CALCIUM  9.7   < >  9.7  10.2   < >   --    < >  10.3  10.3  10.1   MG  1.9   --    --    --    --   1.7   --    --    --    --    PHOS  5.5*   < >  2.8  2.6   --   3.7   --    --    --    --    < > = values in this interval not displayed. Liver Function Tests:   Recent Labs   08/15/12 1820  09/01/12 0552  02/21/13 1945   AST  22  31  22    ALT  12  14  11    ALKPHOS  46  59  80   BILITOT  0.6  0.4  0.4   PROT  6.4  6.0  7.7   ALBUMIN  3.0*  2.6*  3.2*      Recent Labs   03/28/12 1457  03/29/12 1140  05/23/12 1428   LIPASE  40  44  25   CBC:   Recent Labs   09/20/12 1529    02/12/13 1316  02/21/13 1945  02/22/13 0450  02/23/13 0552   WBC  10.0   < >  7.8  11.6*  11.4*  10.3   NEUTROABS  6.8   --   4.8  8.6*   --    --    HGB  13.9   < >  12.3  13.2  12.7  12.5   HCT  39.9   < >  36.2  40.0  39.8  38.6   MCV  96.1   < >  99.7  100.0  101.0*  101.3*   PLT  256   < >  212  275  282  264   < > = values in this interval not displayed. Cardiac Enzymes:   Recent Labs   06/01/12 0730    06/02/12 1707  06/03/12 0605  06/03/12 1107  07/27/12 1717  08/15/12 1820  02/21/13 1945   CKTOTAL   --    < >  44  28  42   --    --   137   CKMB   --    < >  1.7  1.7  1.7   --    --    --    TROPONINI  <0.30   --    --    --    --   <0.30  <0.30   --    < > = values in this interval not displayed. CBG:   Recent Labs   09/05/12 0754  09/05/12 1212  09/21/12 0547   GLUCAP  87  93  91     Imaging and Procedures: 02/21/13 cxr:  Low lung volumes with mild bibasilar atelectasis. Otherwise, no   acute cardiopulmonary disease.   Stable mild cardiomegaly with left jugular prominence.

## 2013-05-01 IMAGING — CT CT ANGIO CHEST
2 of 7 series · 18 of 46 positions shown · IV contrast (APPLIED)
Comparison: Chest CT 04/16/2008.

CLINICAL DATA: Chest pain and shortness of breath.

CT ANGIOGRAPHY CHEST
TECHNIQUE: Multidetector CT imaging of the chest using the
standard protocol during bolus administration of intravenous
contrast. Multiplanar reconstructed images including MIPs were
obtained and reviewed to evaluate the vascular anatomy.
Contrast: 100mL OMNIPAQUE IOHEXOL 350 MG/ML SOLN

[Series 6: pulm embolism 1.0 b25f thin · axial · 0.68mm/px · z∈[-278,-55]mm · 15 of 251 slices shown]
[im 14/251  lung]
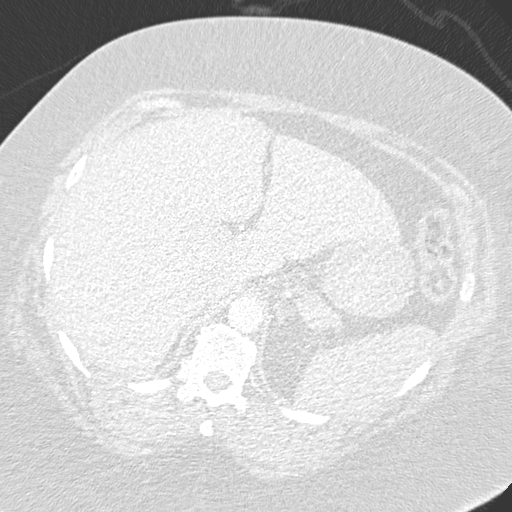
[im 28/251  soft-tissue]
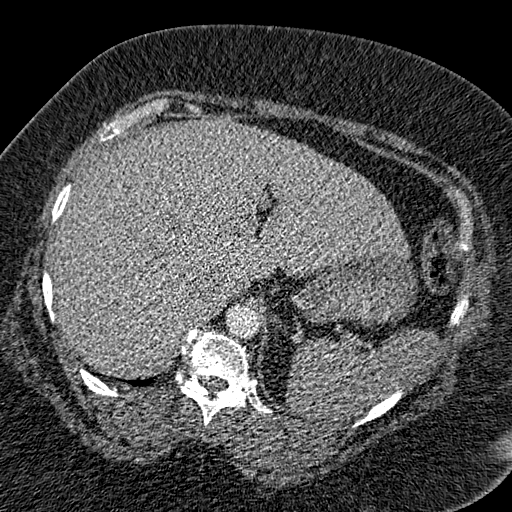
[im 42/251  lung]
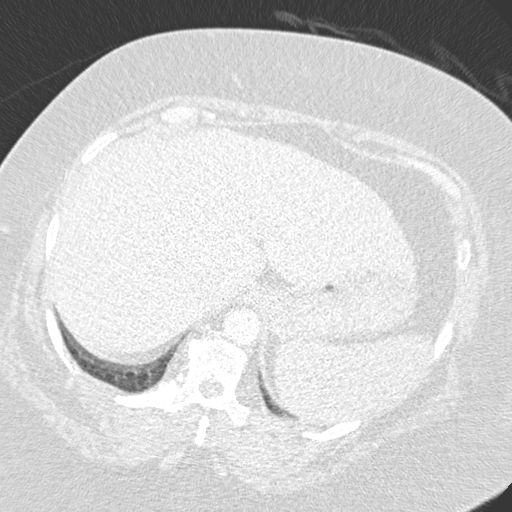
[im 56/251  soft-tissue]
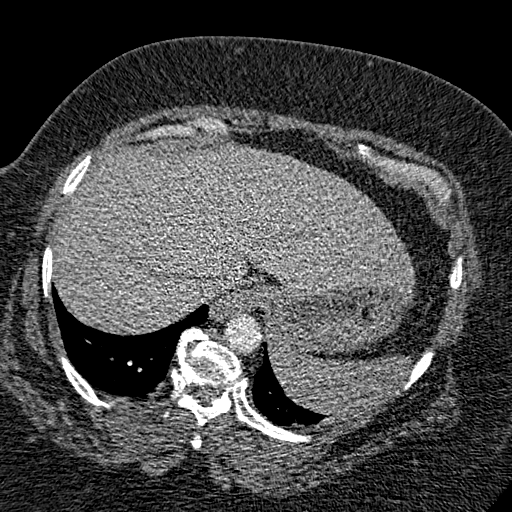
[im 84/251  lung]
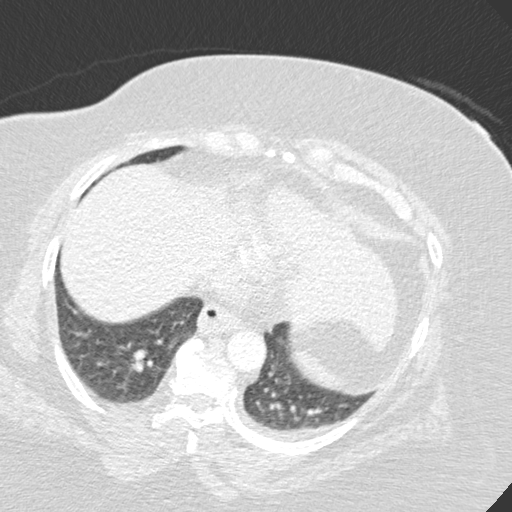
[im 98/251  soft-tissue]
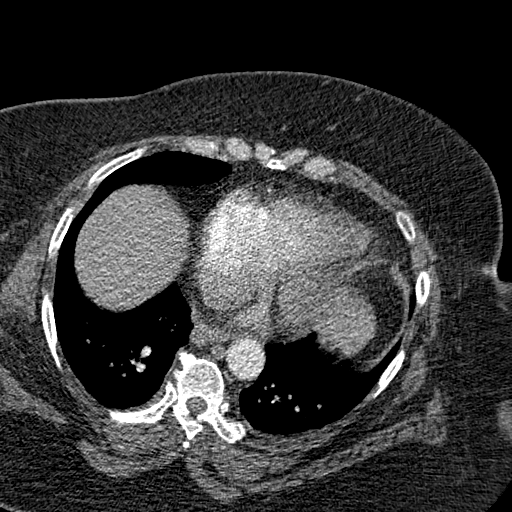
[im 112/251  lung]
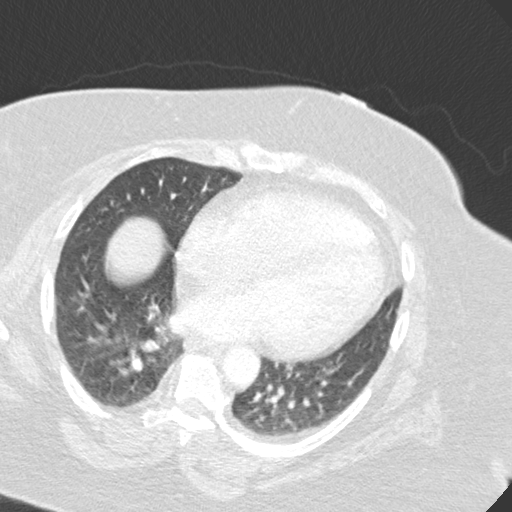
[im 126/251  soft-tissue]
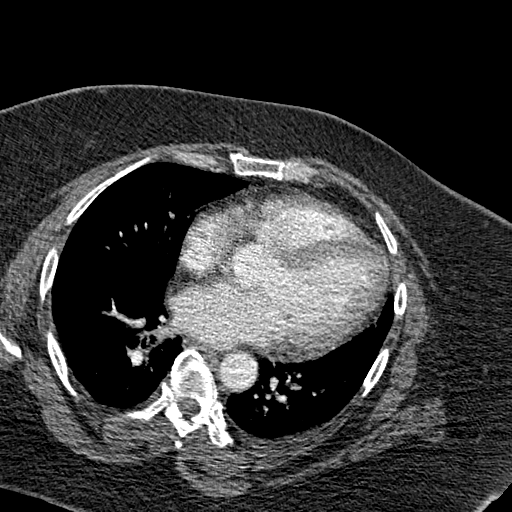
[im 139/251  lung]
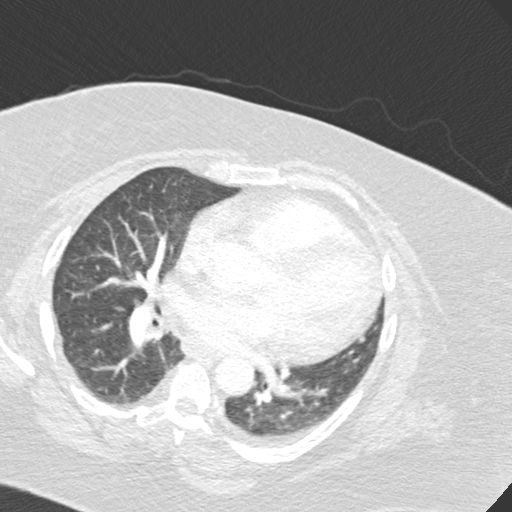
[im 153/251  soft-tissue]
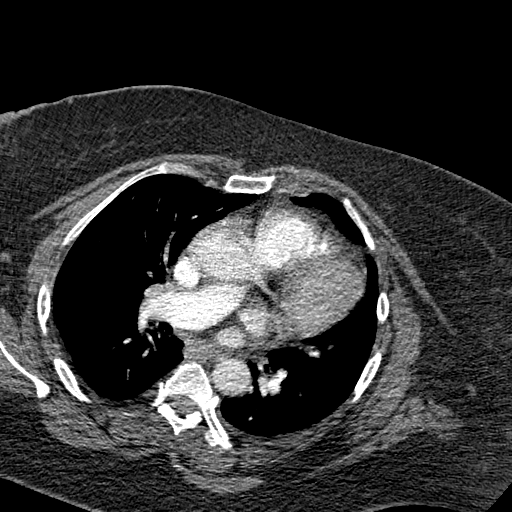
[im 167/251  lung]
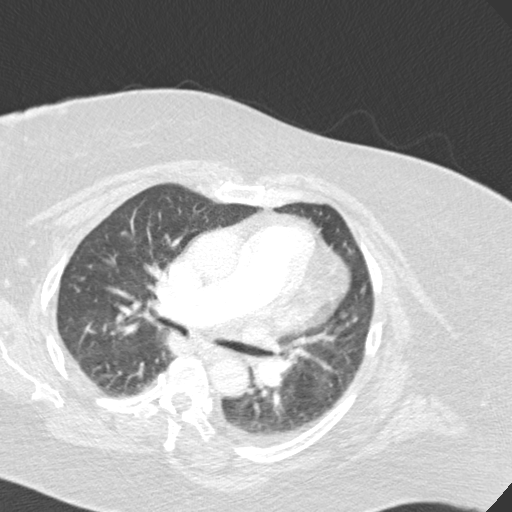
[im 195/251  soft-tissue]
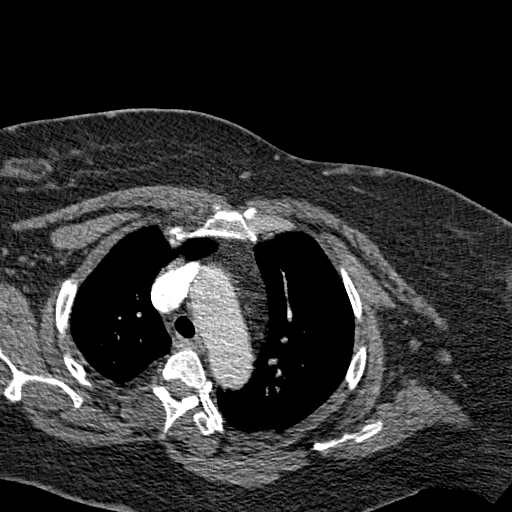
[im 209/251  lung]
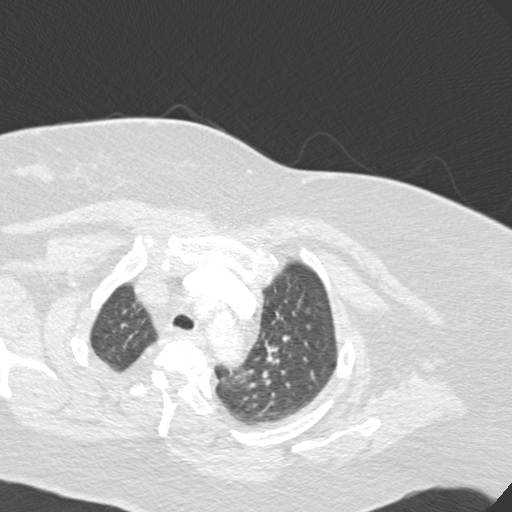
[im 223/251  soft-tissue]
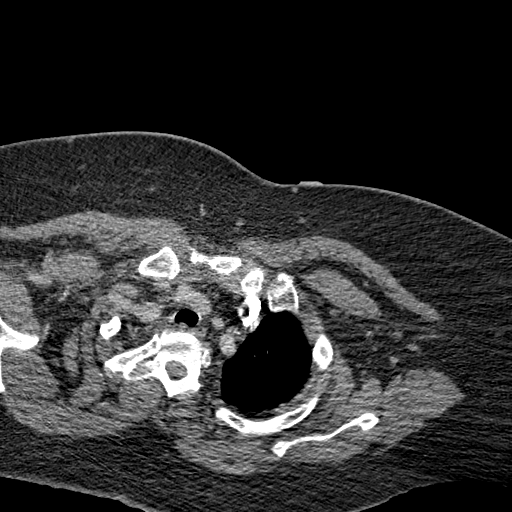
[im 237/251  lung]
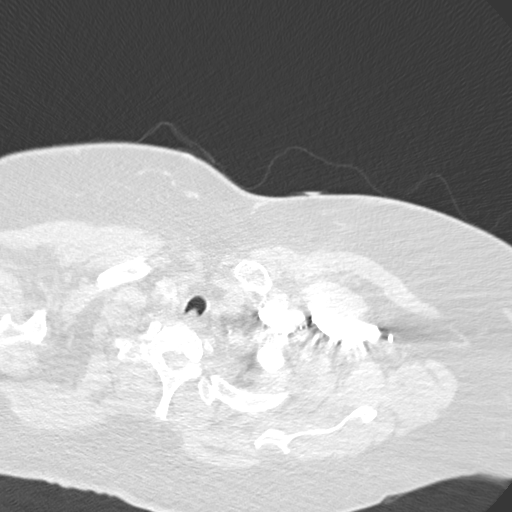

[Series 8: coronals · coronal · 0.52mm/px · 3 of 119 slices shown]
[im 30/119  soft-tissue]
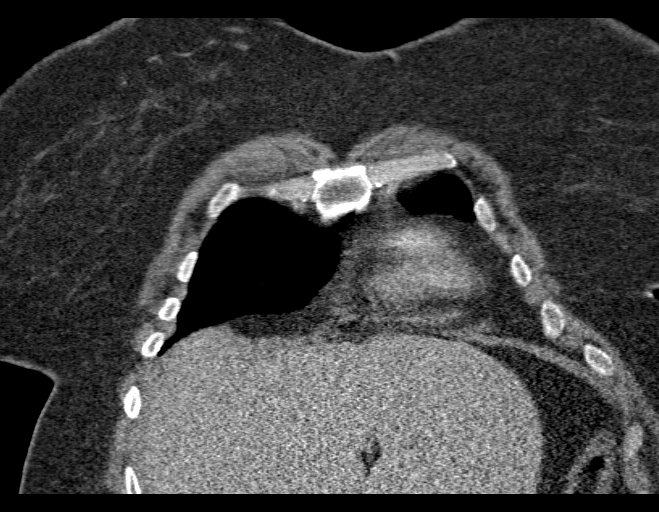
[im 60/119  soft-tissue]
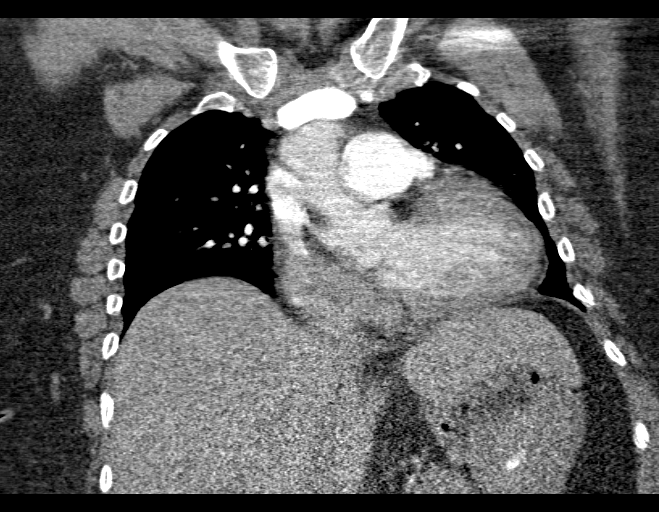
[im 89/119  soft-tissue]
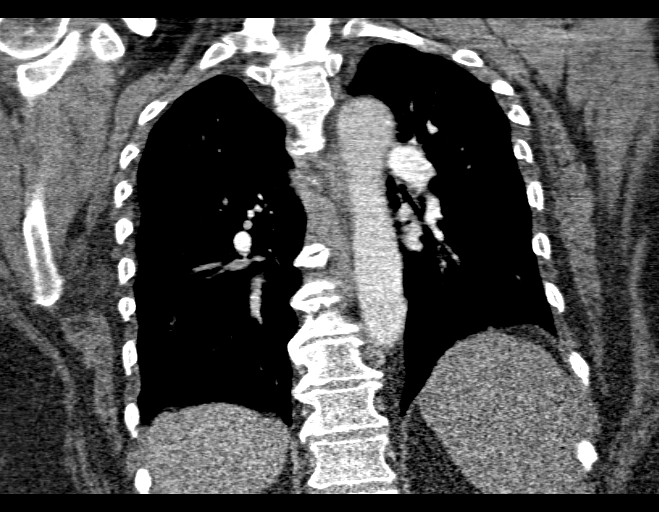

[18 of 46 positions shown; findings below may reference images not displayed]

FINDINGS: Mediastinum: Study is limited by a large amount of patient motion,
image noise from the patient's large body habitus, and suboptimal
contrast bolus.  Within these limitations, there is no evidence of
central, lobar or proximal segmental sized pulmonary embolism.
Unfortunately, more distal segmental and subsegmental sized
embolism cannot be completely excluded on this examination. Heart
size is mildly enlarged. There is no significant pericardial fluid,
thickening or pericardial calcification. No pathologically enlarged
mediastinal or hilar lymph nodes. Esophagus is unremarkable in
appearance.

Lungs/Pleura: No consolidative airspace disease.  No pleural
effusions.  The no definite suspicious appearing pulmonary nodules
or masses are identified.

Upper Abdomen: Unremarkable.

Musculoskeletal: There are no aggressive appearing lytic or blastic
lesions noted in the visualized portions of the skeleton.
IMPRESSION: 1.  Limited examination demonstrating no evidence of central, lobar
or proximal segmental sized pulmonary embolism.
2.  Mild cardiomegaly.

## 2013-05-01 IMAGING — CR DG CHEST 2V
2 series · 2 of 2 positions shown · non-contrast
Comparison: 12/26/2011

CLINICAL DATA: Left side chest pain, heart attack symptoms, history
asthma, COPD, CHF, diabetes, hypertension

CHEST - 2 VIEW

[w chest lat]
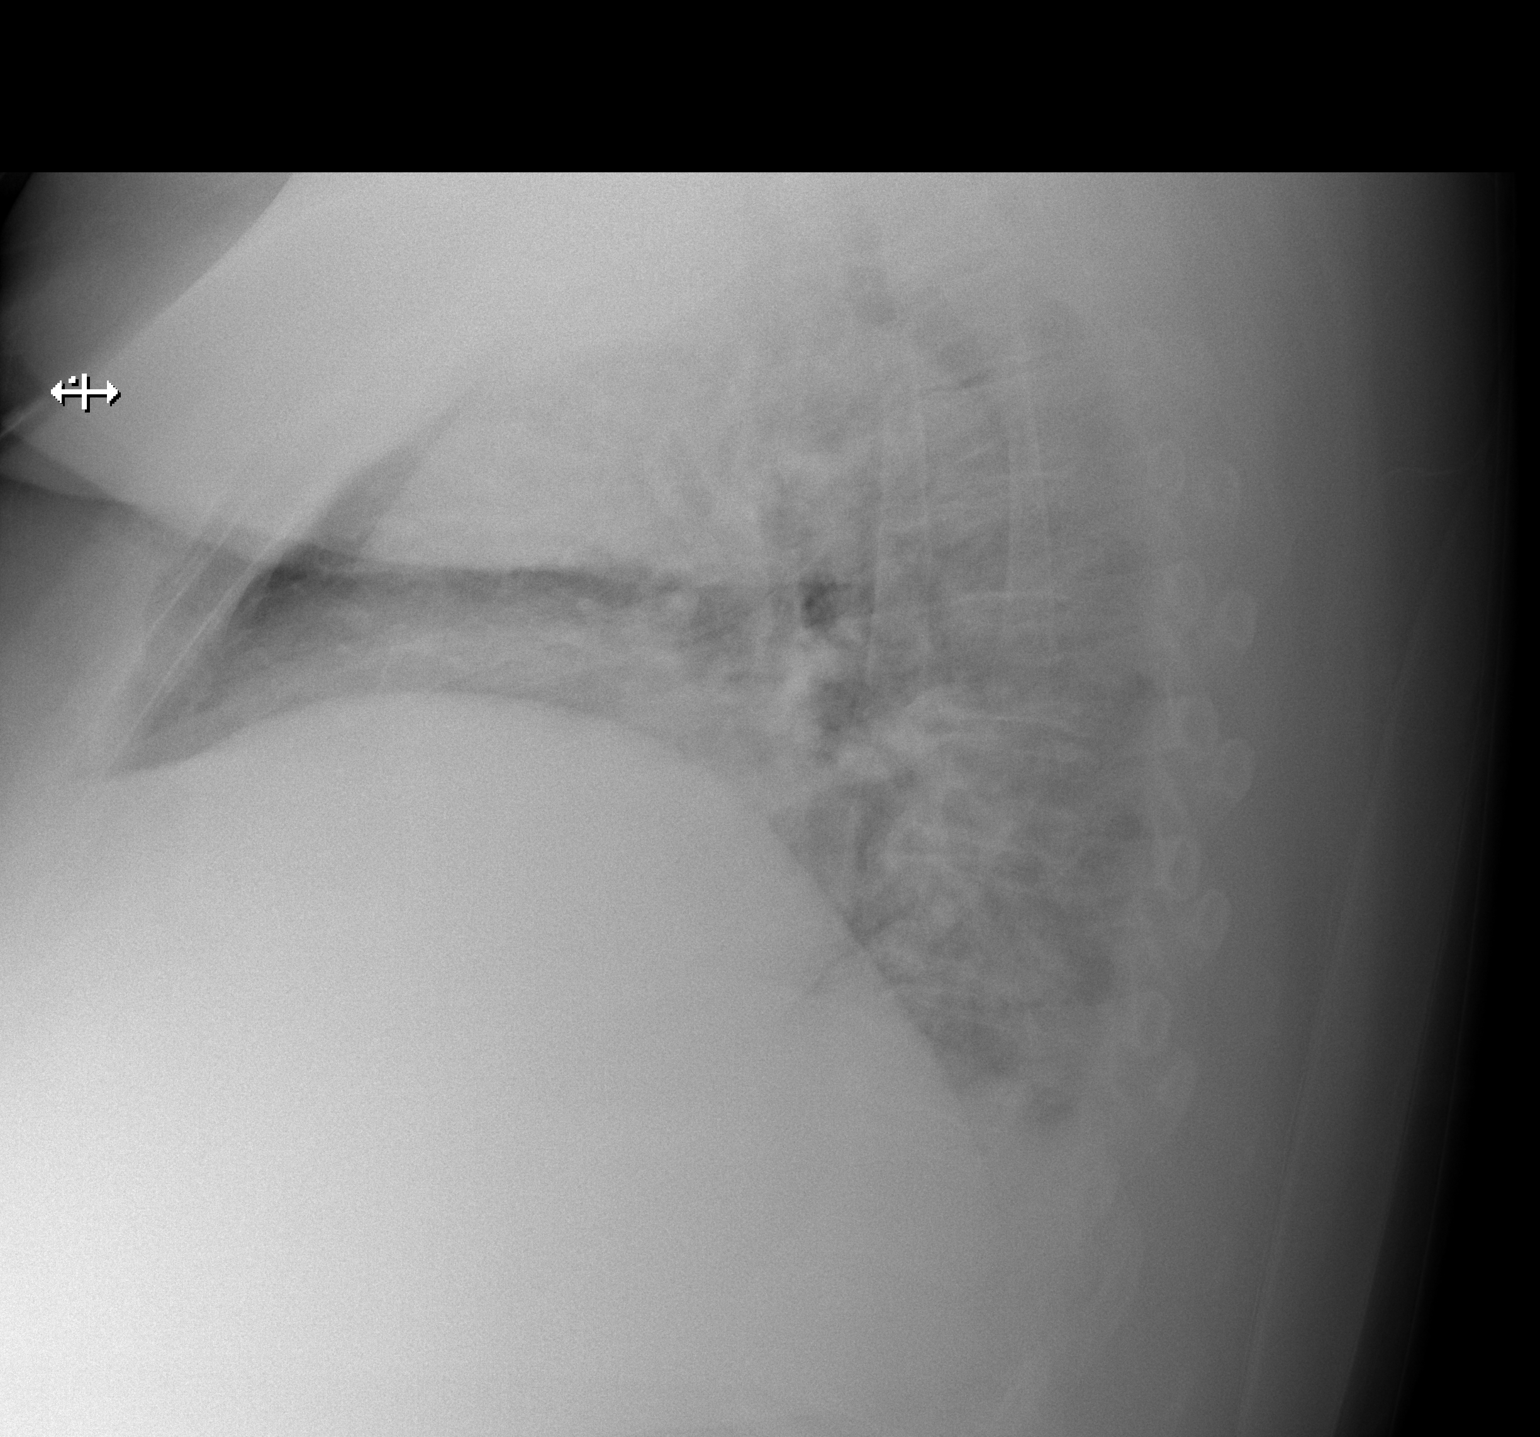

[x chest ap]
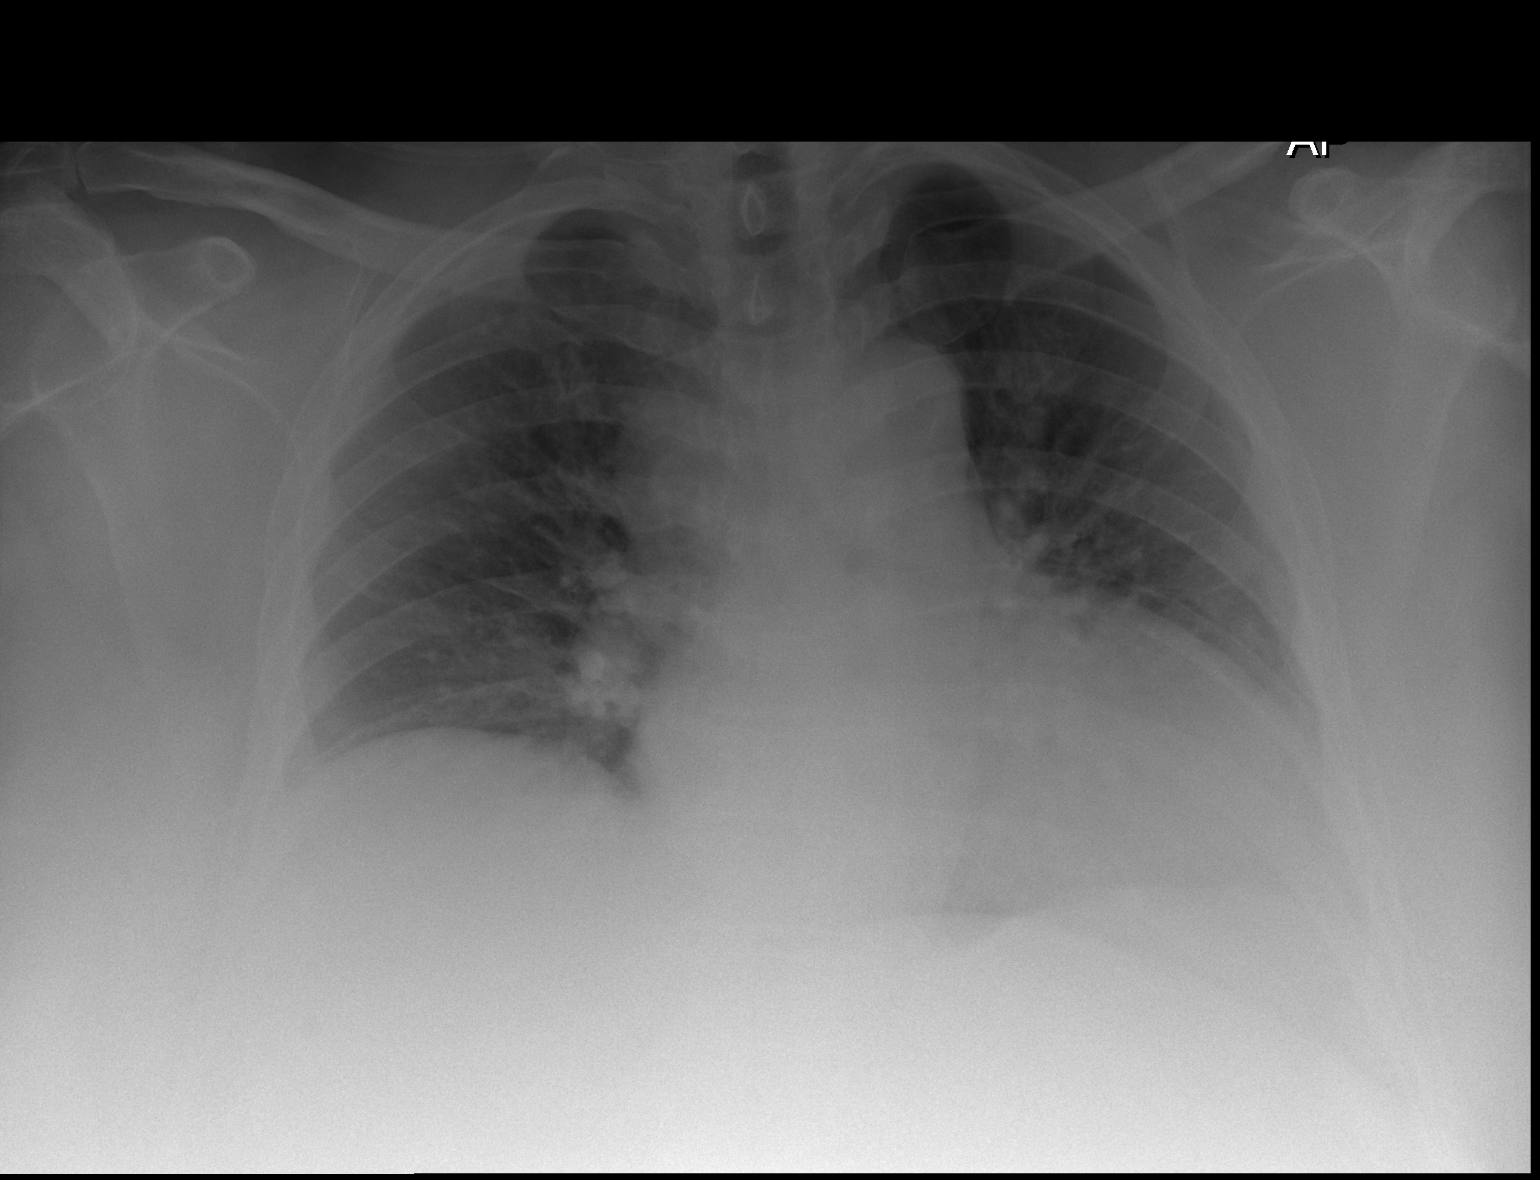

[2 of 2 positions shown; findings below may reference images not displayed]

FINDINGS: Enlargement of cardiac silhouette with pulmonary vascular
congestion.
Tortuous aorta with atherosclerotic calcification at arch.
Decreased lung volumes with crowding of perihilar markings.
No definite acute infiltrate, pleural effusion or pneumothorax.
Minimal right basilar atelectasis.
No acute osseous findings.
IMPRESSION: Enlargement of cardiac silhouette with pulmonary vascular
congestion.
Low lung volumes with minimal right basilar atelectasis.

## 2013-05-03 IMAGING — CR DG CHEST 2V
1 series · 1 of 1 positions shown · non-contrast
Comparison: 01/02/2012

CLINICAL DATA: Pain and shortness of breath

CHEST - 2 VIEW

[view not recorded]
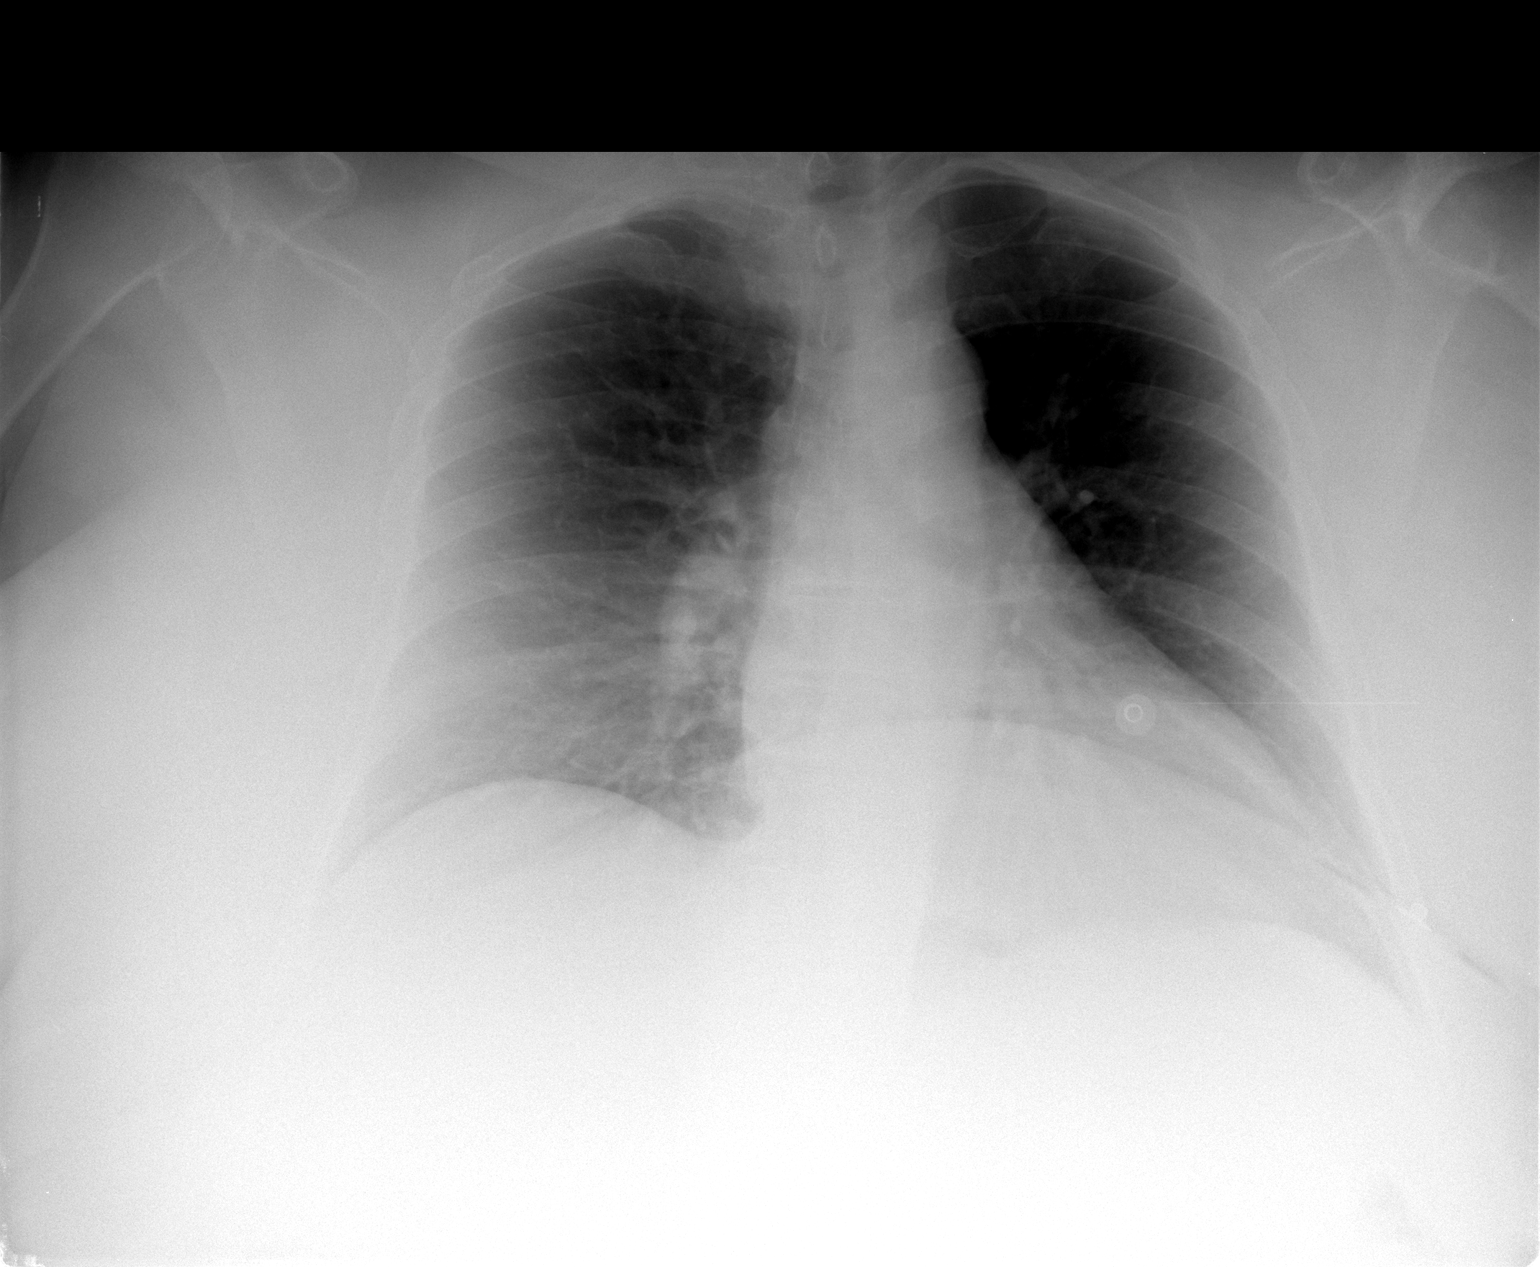

[1 of 1 positions shown; findings below may reference images not displayed]

FINDINGS: The heart and pulmonary vascularity are within normal
limits.  The lungs are clear bilaterally.  No bony abnormality is
seen.
IMPRESSION: No acute intrathoracic abnormality.

## 2013-05-08 ENCOUNTER — Ambulatory Visit: Payer: Medicare Other | Admitting: Podiatrist

## 2013-05-09 IMAGING — CR DG CHEST 2V
2 series · 2 of 2 positions shown · non-contrast
Comparison: 01/04/2012

CLINICAL DATA: Shortness of breath.  Hypertension.

CHEST - 2 VIEW

[w chest lat]
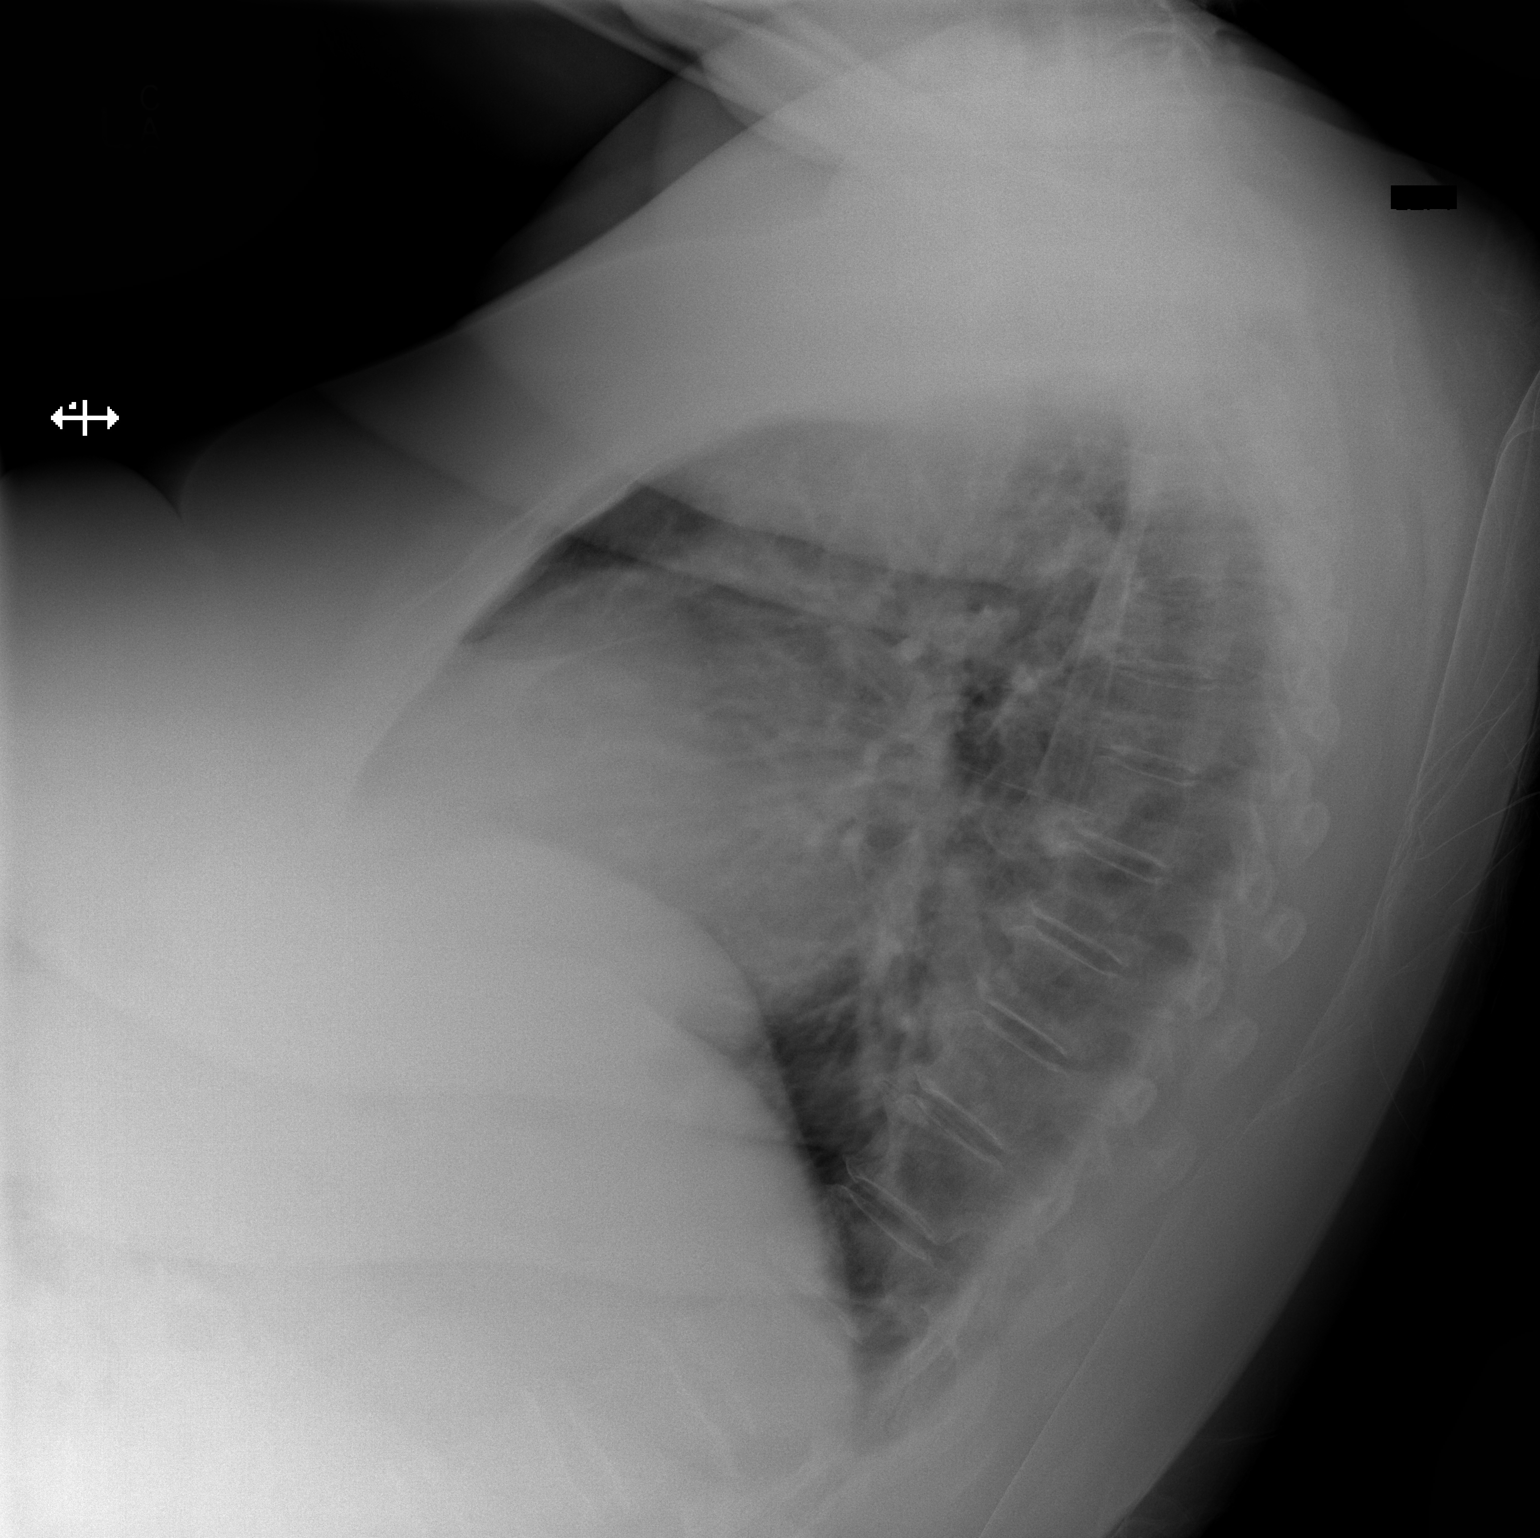

[x chest ap]
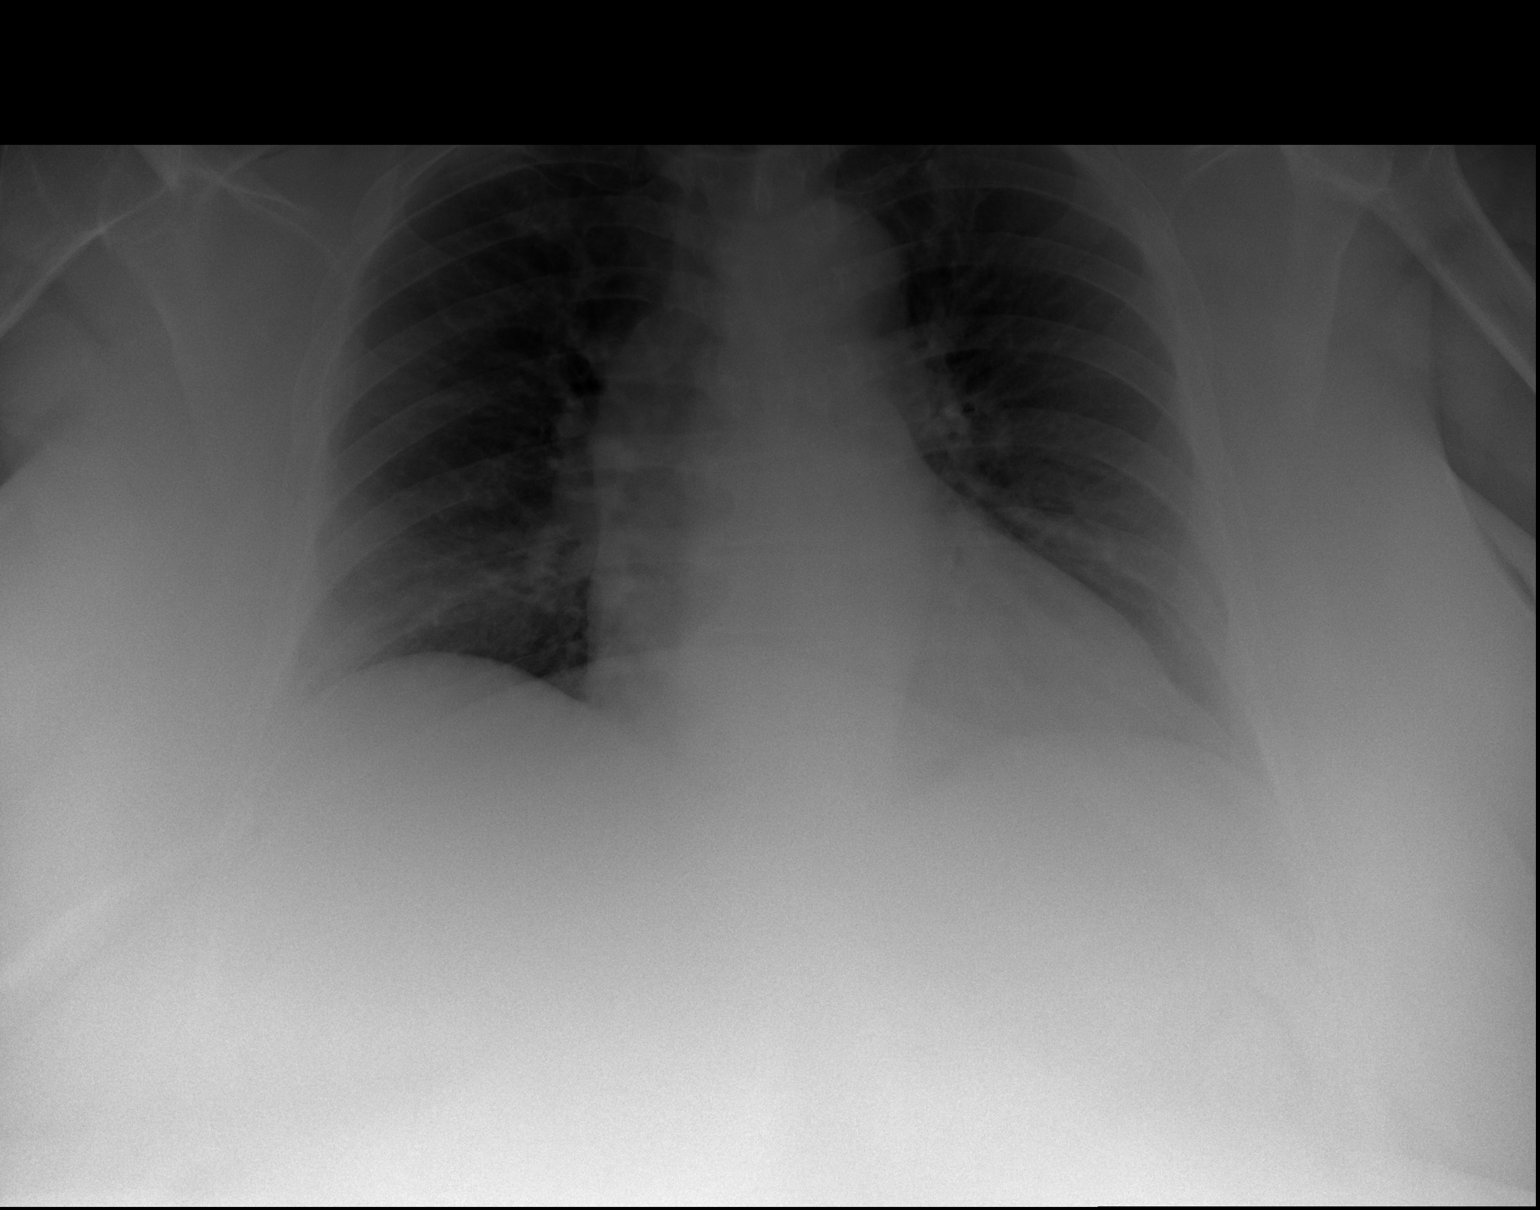

[2 of 2 positions shown; findings below may reference images not displayed]

FINDINGS: Borderline cardiomegaly noted with airway thickening and
faint interstitial accentuation.  There is tortuosity of the
thoracic aorta.  Thoracic spondylosis noted.

No pleural effusion is observed.

Technical factors related to patient body habitus reduce diagnostic
sensitivity and specificity.
IMPRESSION: 1.  Mild airway thickening and faint interstitial accentuation with
borderline cardiomegaly. Differential diagnostic considerations
include mild bronchitis/atypical pneumonia (favored) over minimal
interstitial pulmonary edema.
2.  Tortuous aorta.
3.  Thoracic spondylosis.

## 2013-05-10 IMAGING — CR DG SHOULDER 2+V*L*
1 series · 3 of 3 positions shown · non-contrast
Comparison: 11/07/2011

CLINICAL DATA: Left shoulder pain.  No injury.

LEFT SHOULDER - 2+ VIEW

[Series 1: ap int/ext rotation · left · 3 of 3 slices shown]
[im 1/3]
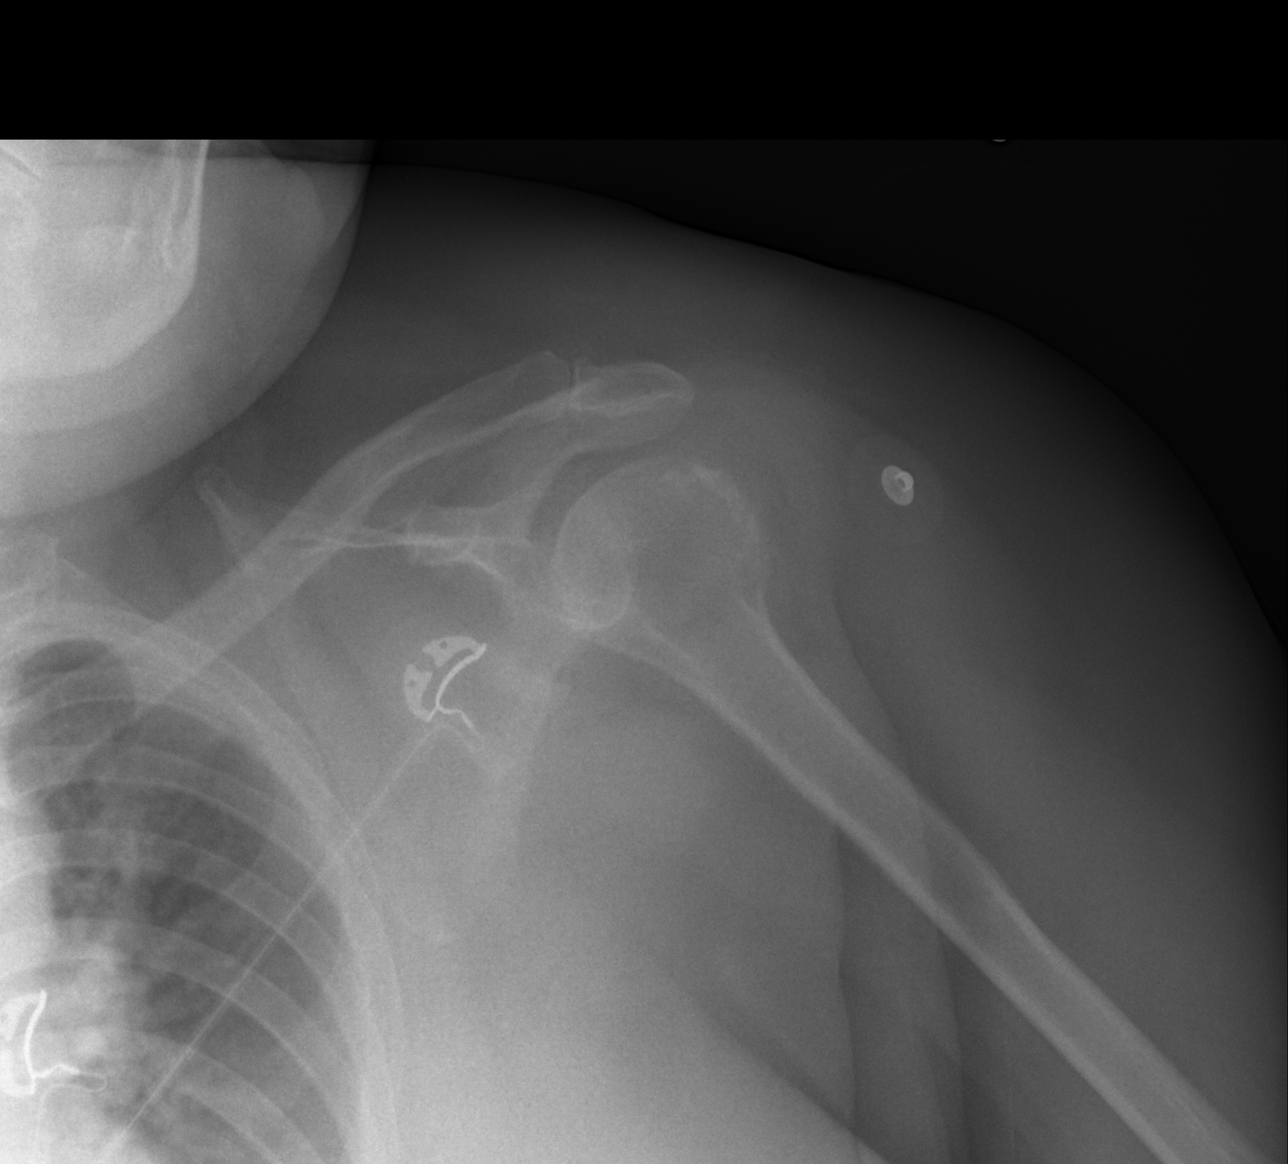
[im 2/3]
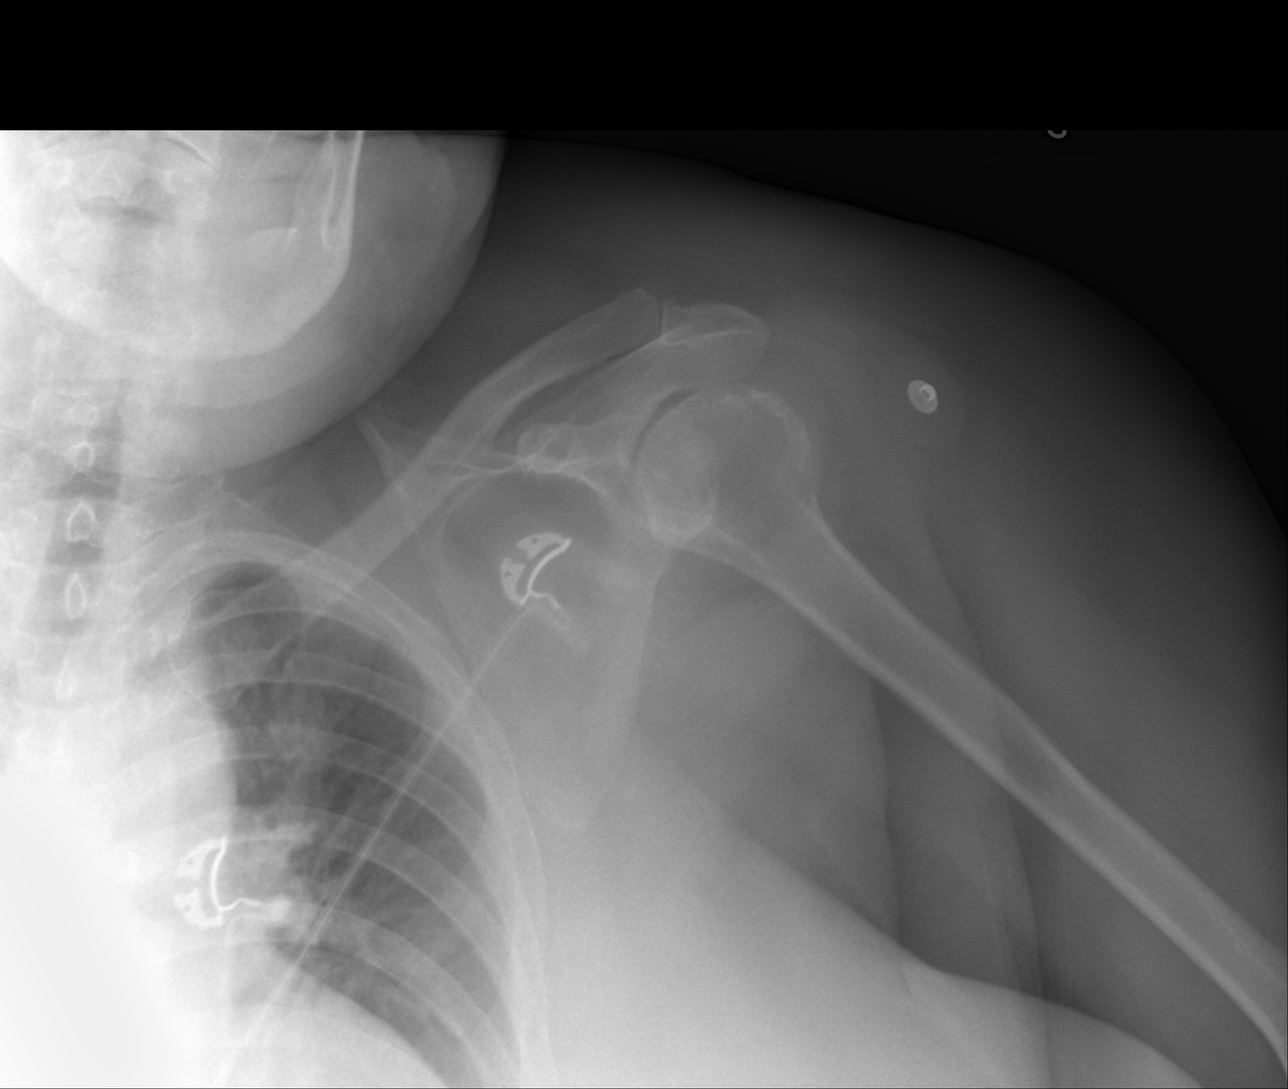
[im 3/3]
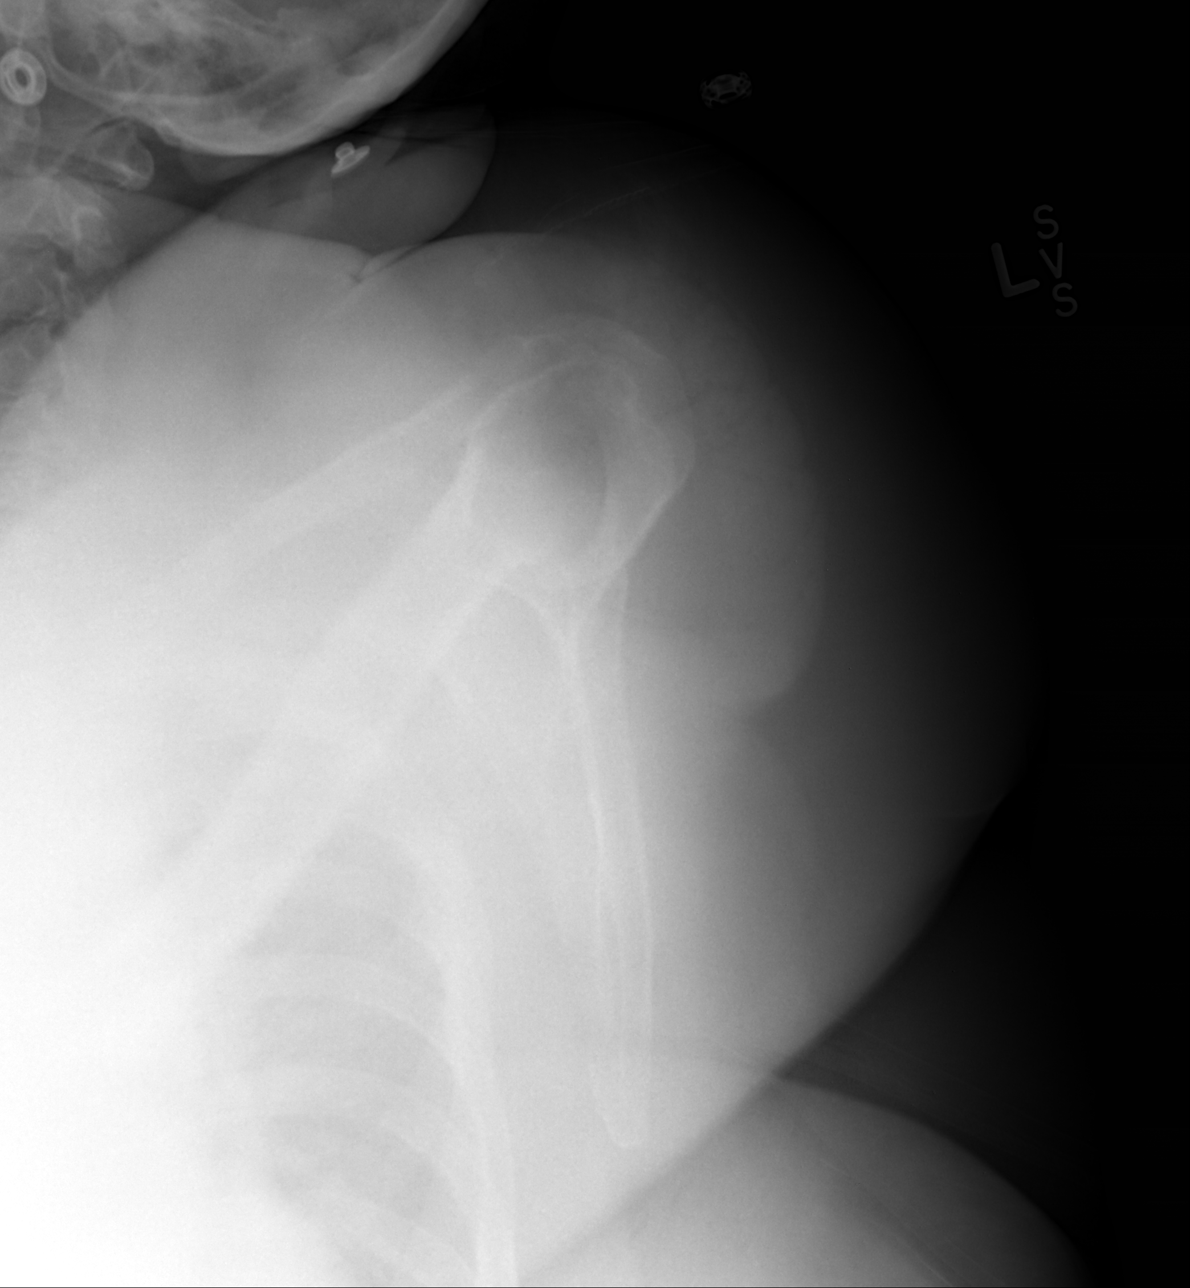

[3 of 3 positions shown; findings below may reference images not displayed]

FINDINGS: Limited range of motion is noted comparing the internal
and external views.  No obvious fracture or dislocation.  Chronic
changes at the glenohumeral joint are noted.
IMPRESSION: No acute bony pathology.  Degenerative change.

## 2013-05-11 IMAGING — CR DG CHEST 2V
2 series · 2 of 2 positions shown · non-contrast
Comparison: 01/10/2012

CLINICAL DATA: Shortness of breath, wheezing, cough, congestion,
possible pneumonia

CHEST - 2 VIEW

[w chest lat]
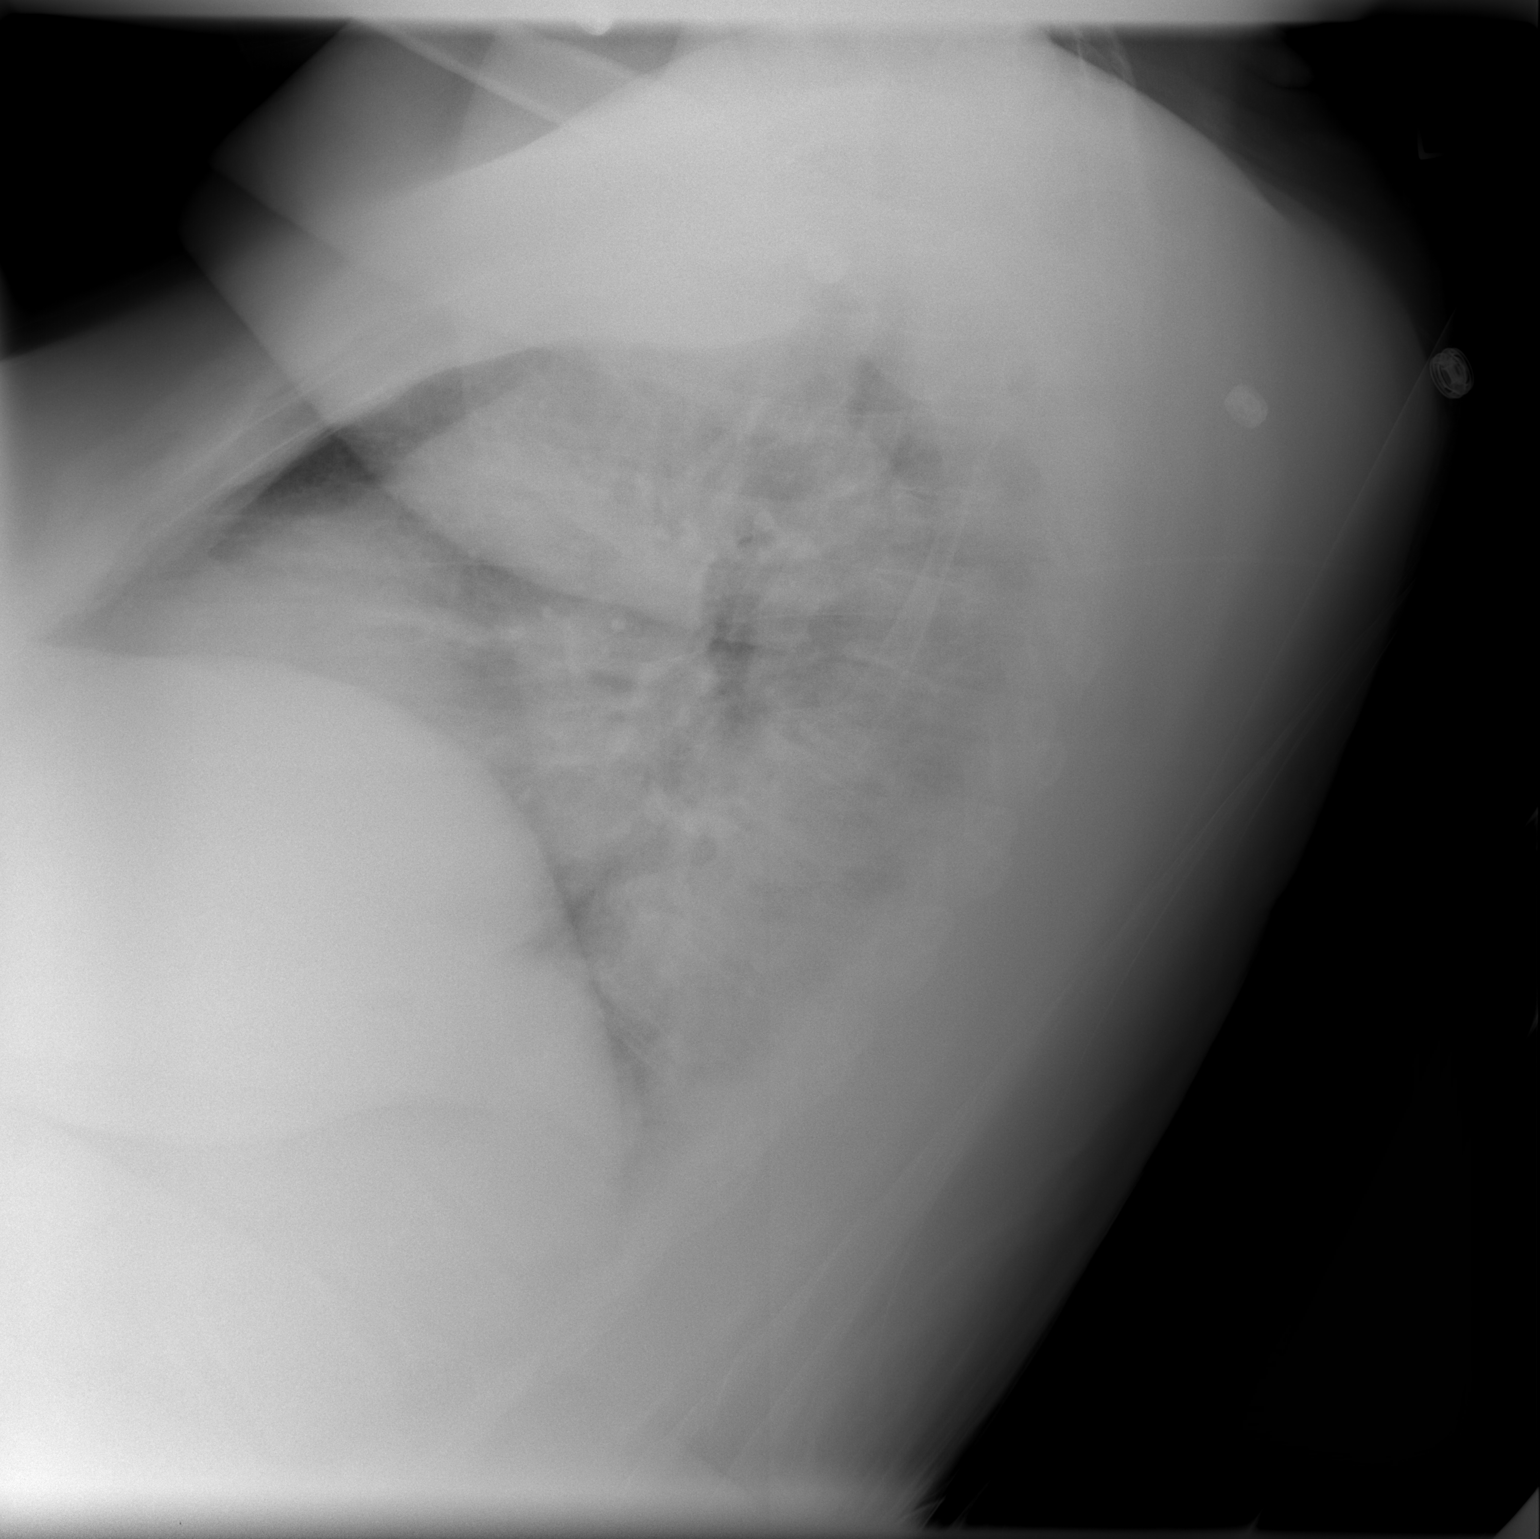

[view not recorded]
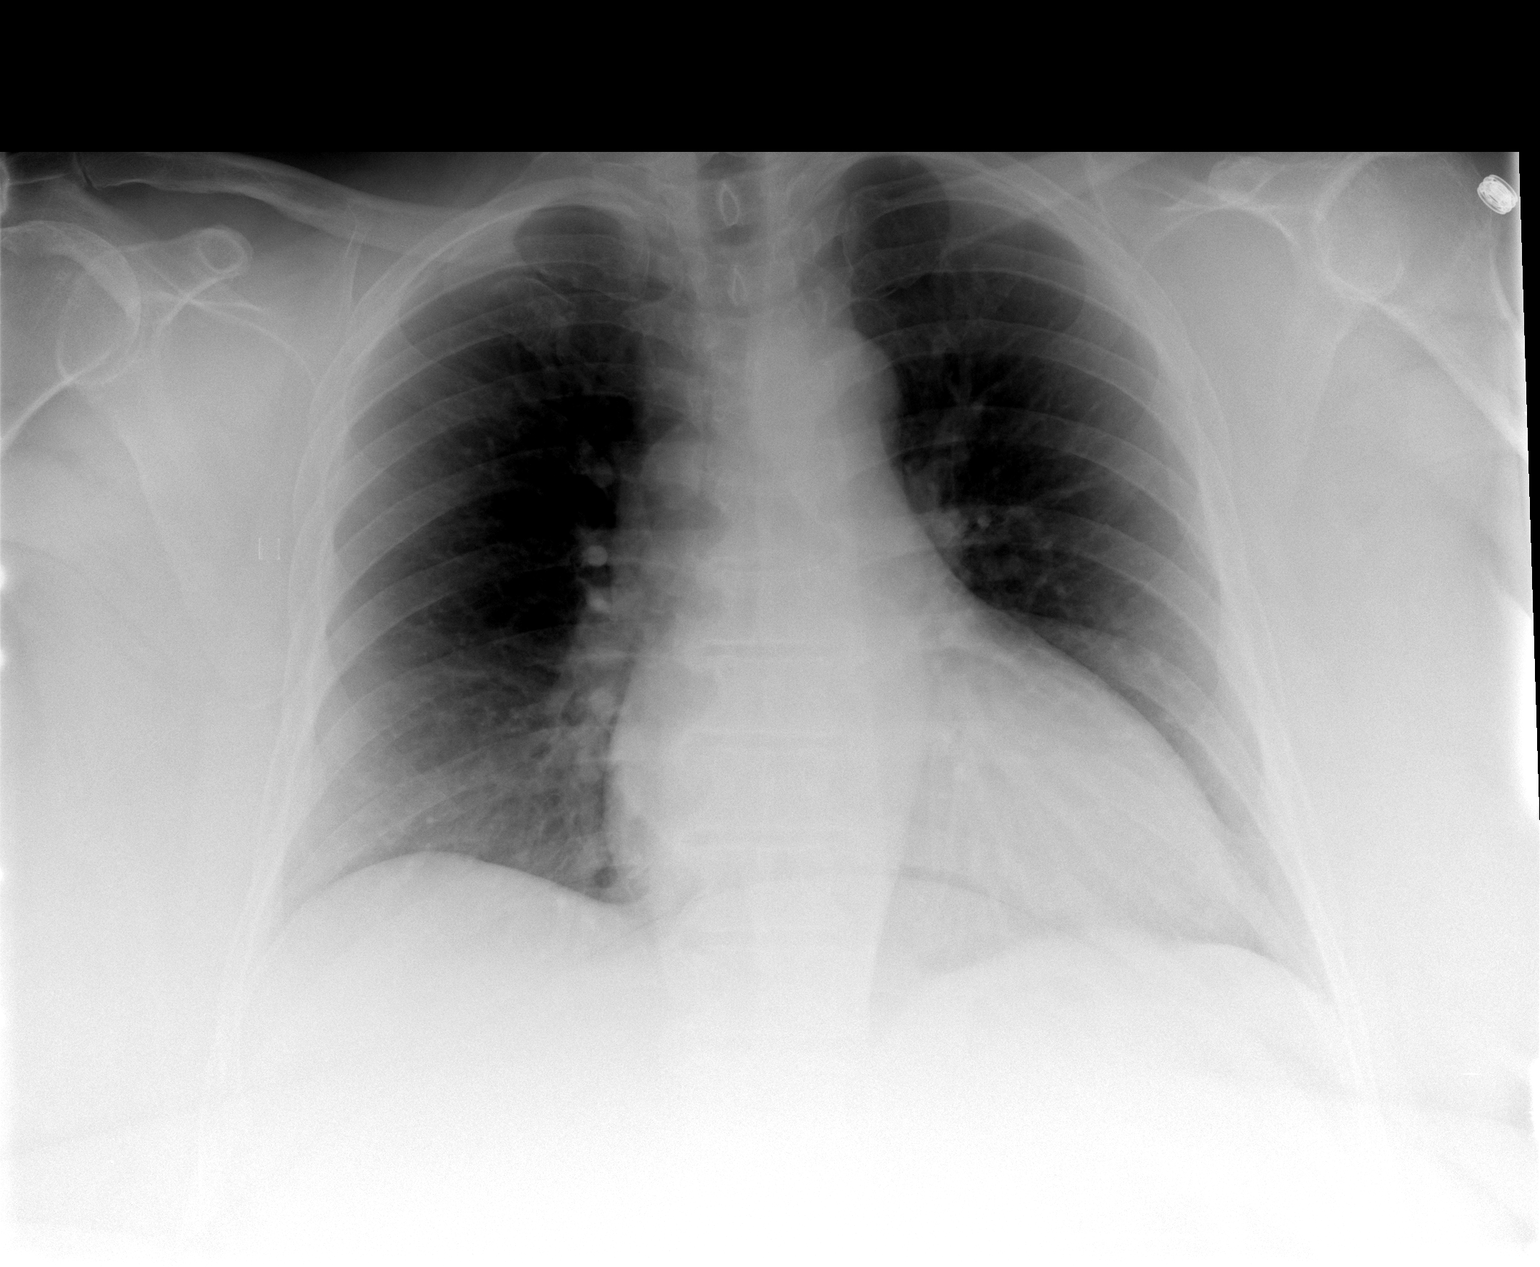

[2 of 2 positions shown; findings below may reference images not displayed]

FINDINGS: Enlargement of cardiac silhouette.
Minimally tortuous aorta.
Pulmonary vascularity normal.
Lungs clear.
No pleural effusion or pneumothorax.
Underpenetration on lateral view.
No acute osseous findings.
IMPRESSION: No acute abnormalities.
Enlargement of cardiac silhouette.

## 2013-05-13 NOTE — Progress Notes (Signed)
Patient ID: [name], female   DOB: [birth year], 62 y.o.   MRN: [id number]     [facility]  Allergies  Allergen Reactions  . Food Rash    "lasagna"= rash from the noodles  . Penicillins Hives  . Sulfamethoxazole-Trimethoprim Hives and Nausea And Vomiting    Chief Complaint  Patient presents with  . Medical Managment of Chronic Issues    HPI She is being seen for the management of her chronic illnesses. She has been treated for an uti in May of this this year without furthers complications.  There are no concerns being voiced by the nursing staff at this time. She is not voicing any concerns at this time.   Past Medical History  Diagnosis Date  . Schizoaffective disorder   . Urinary tract infection   . Hypertension   . GERD (gastroesophageal reflux disease)   . Hypothyroidism   . Obesity   . Asthma   . COPD (chronic obstructive pulmonary disease)   . CHF (congestive heart failure)   . Seizures   . Morbid obesity   . Chronic pain   . Hypercholesteremia   . Anginal pain   . Myocardial infarction 2002  . Diabetes mellitus     "I'm not diabetic anymore" (05/23/2012)  . Anemia   . History of blood transfusion 1993    "when I had my hysterectomy" (05/23/2012)  . H/O hiatal hernia   . Daily headache   . Arthritis     "severe; all over my body" (05/23/2012)  . Hypoventilation syndrome     [name] 05/30/2012  . Atrial fibrillation   . Atrial flutter, paroxysmal     [name] 05/30/2012  . Exertional dyspnea   . Shortness of breath     "all the time lately" (05/30/2012)   Past Surgical History  Procedure Laterality Date  . Tubal ligation  1998  . Cholecystectomy  ?2008  . Cardiac catheterization  2008  . Vaginal hysterectomy  1993     Filed Vitals:   12/11/12 1228  BP: 129/72  Pulse: 77  Height: 5\' 2"  (1.575 m)  Weight: 272 lb (123.378 kg)   MEDICATIONS  invega er 12 mg daily toprol xl 50 mg daily Ultram 50 mg four times daily Albuterol 2 puffs every 6  hours as needed Asa 81 mg daily Ativan 0.5 mg twice daily as needed flonas 2 puffs per nare daily Synthroid 150 mcg daily miralax 2 times weekly ntg 0.4 mg as needed prilosec 20 mg daily pulmicort neb twice daily    LABS REVIEWED:   11-02-12; wbc 7.9; hgb 12.0; ct 35.7; mcv 100.6; plt 258; lacunose 95; bun 8; creat 0.60; k+3.5; na++136; liver normal albumin 3.5 11-05-12: urine culture: e-coli: macrobid   Review of Systems  Constitutional: Negative for malaise/fatigue.  Respiratory: Negative for shortness of breath.   Cardiovascular: Negative for chest pain, palpitations and leg swelling.  Gastrointestinal: Negative for heartburn and abdominal pain.  Genitourinary: Negative for dysuria.  Musculoskeletal: Negative for back pain and myalgias.  Skin: Negative.   Neurological: Negative for headaches.  Psychiatric/Behavioral: Negative for depression. The patient is not nervous/anxious.     Physical Exam  Constitutional: She appears well-developed and well-nourished. No distress.  obese  Neck: Neck supple. No JVD present.  Cardiovascular: Normal rate, regular rhythm and intact distal pulses.   Respiratory: Effort normal and breath sounds normal. No respiratory distress. She has no wheezes.  GI: Soft. Bowel sounds are normal. She exhibits no distension. There is  no tenderness.  Musculoskeletal: She exhibits no edema.  Is able to move all extremities  Neurological: She is alert.  Skin: Skin is warm and dry. She is not diaphoretic.  Psychiatric: She has a normal mood and affect.     ASSESSMENT/PLAN  1. Hypothyroidism: is stable will aconitine synthroid 150 mcg daily  2. Hypertension: is stable will continue toprol xl 50 mg daily ans asa 81 mg daily  3. Gerd: will continue prilosec 20 mg daily  4. Low back pain: is being managed with ultram 50 mg four times daily will monitor   5. Copd: is stable will continue pulmicort neb twice daily albuterol 2 puffs every 2 hours as  needed; flonase daily and will monitor   6. Constipation: will continue miralax twice weekly   7. Schizophrenia: will continue invega er 12 mg daily and ativan 0.5 mg twice daily as needed for anxiety  Will check hgb a1c next draw

## 2013-05-14 ENCOUNTER — Non-Acute Institutional Stay (SKILLED_NURSING_FACILITY): Payer: Medicare Other | Admitting: Internal Medicine

## 2013-05-14 DIAGNOSIS — F259 Schizoaffective disorder, unspecified: Secondary | ICD-10-CM

## 2013-05-14 DIAGNOSIS — F250 Schizoaffective disorder, bipolar type: Secondary | ICD-10-CM

## 2013-05-18 IMAGING — CR DG CHEST 1V
1 series · 1 of 1 positions shown · non-contrast
Comparison: 01/12/2012

CLINICAL DATA: Fever, shortness of breath

CHEST - 1 VIEW

[view not recorded]
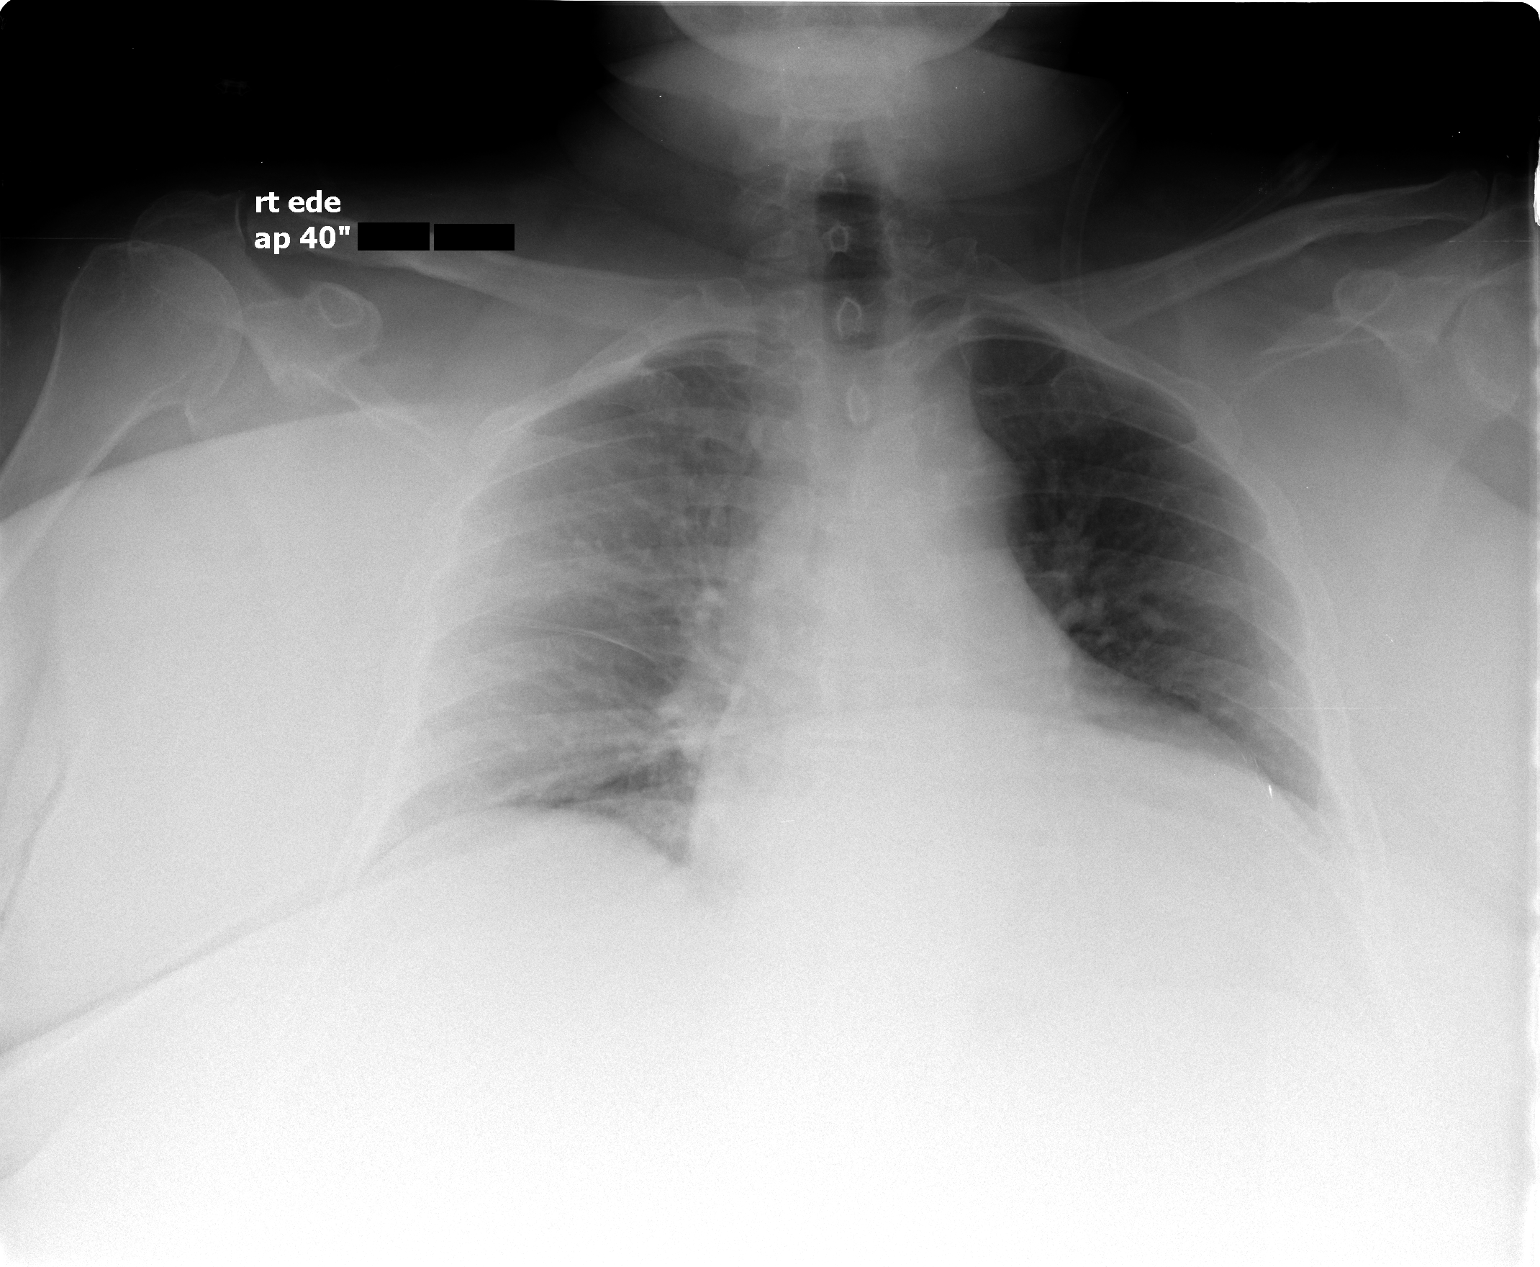

[1 of 1 positions shown; findings below may reference images not displayed]

FINDINGS: Cardiomegaly again noted.  The exam is limited by
patient's large body habitus.  Mild elevation of the right
hemidiaphragm.  No acute infiltrate or pulmonary edema.
IMPRESSION: Limited study by patient's large body habitus.  Cardiomegaly again
noted.  No active disease.

## 2013-05-20 ENCOUNTER — Emergency Department (HOSPITAL_COMMUNITY)
Admission: EM | Admit: 2013-05-20 | Discharge: 2013-05-23 | Disposition: A | Discharge status: Other Acute Inpatient Hospital | Payer: Medicare Other | Attending: Internal Medicine | Admitting: Internal Medicine

## 2013-05-20 ENCOUNTER — Encounter (HOSPITAL_COMMUNITY): Payer: Self-pay | Admitting: Emergency Medicine

## 2013-05-20 DIAGNOSIS — J4489 Other specified chronic obstructive pulmonary disease: Secondary | ICD-10-CM | POA: Insufficient documentation

## 2013-05-20 DIAGNOSIS — IMO0002 Reserved for concepts with insufficient information to code with codable children: Secondary | ICD-10-CM | POA: Insufficient documentation

## 2013-05-20 DIAGNOSIS — Z95818 Presence of other cardiac implants and grafts: Secondary | ICD-10-CM | POA: Insufficient documentation

## 2013-05-20 DIAGNOSIS — I509 Heart failure, unspecified: Secondary | ICD-10-CM | POA: Insufficient documentation

## 2013-05-20 DIAGNOSIS — Z862 Personal history of diseases of the blood and blood-forming organs and certain disorders involving the immune mechanism: Secondary | ICD-10-CM | POA: Insufficient documentation

## 2013-05-20 DIAGNOSIS — F259 Schizoaffective disorder, unspecified: Secondary | ICD-10-CM | POA: Insufficient documentation

## 2013-05-20 DIAGNOSIS — Z8701 Personal history of pneumonia (recurrent): Secondary | ICD-10-CM | POA: Insufficient documentation

## 2013-05-20 DIAGNOSIS — Z79899 Other long term (current) drug therapy: Secondary | ICD-10-CM | POA: Insufficient documentation

## 2013-05-20 DIAGNOSIS — I252 Old myocardial infarction: Secondary | ICD-10-CM | POA: Insufficient documentation

## 2013-05-20 DIAGNOSIS — I10 Essential (primary) hypertension: Secondary | ICD-10-CM | POA: Insufficient documentation

## 2013-05-20 DIAGNOSIS — F22 Delusional disorders: Secondary | ICD-10-CM | POA: Insufficient documentation

## 2013-05-20 DIAGNOSIS — F29 Unspecified psychosis not due to a substance or known physiological condition: Secondary | ICD-10-CM | POA: Insufficient documentation

## 2013-05-20 DIAGNOSIS — Z880 Allergy status to penicillin: Secondary | ICD-10-CM | POA: Insufficient documentation

## 2013-05-20 DIAGNOSIS — Z87891 Personal history of nicotine dependence: Secondary | ICD-10-CM | POA: Insufficient documentation

## 2013-05-20 DIAGNOSIS — K219 Gastro-esophageal reflux disease without esophagitis: Secondary | ICD-10-CM | POA: Insufficient documentation

## 2013-05-20 DIAGNOSIS — G40909 Epilepsy, unspecified, not intractable, without status epilepticus: Secondary | ICD-10-CM | POA: Insufficient documentation

## 2013-05-20 DIAGNOSIS — I209 Angina pectoris, unspecified: Secondary | ICD-10-CM | POA: Insufficient documentation

## 2013-05-20 DIAGNOSIS — J449 Chronic obstructive pulmonary disease, unspecified: Secondary | ICD-10-CM | POA: Insufficient documentation

## 2013-05-20 DIAGNOSIS — E039 Hypothyroidism, unspecified: Secondary | ICD-10-CM | POA: Insufficient documentation

## 2013-05-20 DIAGNOSIS — M129 Arthropathy, unspecified: Secondary | ICD-10-CM | POA: Insufficient documentation

## 2013-05-20 DIAGNOSIS — G8929 Other chronic pain: Secondary | ICD-10-CM | POA: Insufficient documentation

## 2013-05-20 DIAGNOSIS — N390 Urinary tract infection, site not specified: Secondary | ICD-10-CM | POA: Insufficient documentation

## 2013-05-20 DIAGNOSIS — Z7982 Long term (current) use of aspirin: Secondary | ICD-10-CM | POA: Insufficient documentation

## 2013-05-20 DIAGNOSIS — E119 Type 2 diabetes mellitus without complications: Secondary | ICD-10-CM | POA: Insufficient documentation

## 2013-05-20 LAB — COMPREHENSIVE METABOLIC PANEL
AST: 11 U/L (ref 0–37)
Albumin: 3.3 g/dL — ABNORMAL LOW (ref 3.5–5.2)
BUN: 7 mg/dL (ref 6–23)
CO2: 28 mEq/L (ref 19–32)
Calcium: 10.2 mg/dL (ref 8.4–10.5)
Chloride: 98 mEq/L (ref 96–112)
Creatinine, Ser: 0.56 mg/dL (ref 0.50–1.10)
GFR calc Af Amer: >90 mL/min (ref >90)
GFR calc non Af Amer: >90 mL/min (ref >90)
Glucose, Bld: 102 mg/dL — ABNORMAL HIGH (ref 70–99)
Potassium: 3.9 mEq/L (ref 3.5–5.1)

## 2013-05-20 LAB — URINALYSIS, ROUTINE W REFLEX MICROSCOPIC
Bilirubin Urine: NEGATIVE
Glucose, UA: NEGATIVE mg/dL
Ketones, ur: NEGATIVE mg/dL
Nitrite: POSITIVE — AB
Urobilinogen, UA: 0.2 mg/dL (ref 0.0–1.0)
pH: 6.5 (ref 5.0–8.0)

## 2013-05-20 LAB — RAPID URINE DRUG SCREEN, HOSP PERFORMED: Barbiturates: NOT DETECTED

## 2013-05-20 LAB — CBC
HCT: 36.2 % (ref 36.0–46.0)
MCH: 33 pg (ref 26.0–34.0)
MCV: 100.3 fL — ABNORMAL HIGH (ref 78.0–100.0)
Platelets: 236 10*3/uL (ref 150–400)
RBC: 3.61 MIL/uL — ABNORMAL LOW (ref 3.87–5.11)
WBC: 7.4 10*3/uL (ref 4.0–10.5)

## 2013-05-20 LAB — SALICYLATE LEVEL: Salicylate Lvl: <2 mg/dL — ABNORMAL LOW (ref 2.8–20.0)

## 2013-05-20 LAB — URINE MICROSCOPIC-ADD ON

## 2013-05-20 LAB — ETHANOL: Alcohol, Ethyl (B): <11 mg/dL (ref 0–11)

## 2013-05-20 MED ORDER — NICOTINE 21 MG/24HR TD PT24
21.0000 mg | MEDICATED_PATCH | Freq: Every day | TRANSDERMAL | Status: DC
  Filled 2013-05-20: qty 1

## 2013-05-20 MED ORDER — INSULIN ASPART 100 UNIT/ML ~~LOC~~ SOLN: 0.0000 [IU] | SUBCUTANEOUS | Status: DC

## 2013-05-20 MED ORDER — DEXTROSE 5 % IV SOLN
1.0000 g | Freq: Once | INTRAVENOUS | Status: AC
  Administered 2013-05-20: 1 g via INTRAVENOUS
  Filled 2013-05-20: qty 10

## 2013-05-20 MED ORDER — ONDANSETRON HCL 4 MG PO TABS: 4.0000 mg | ORAL_TABLET | Freq: Three times a day (TID) | ORAL | Status: DC | PRN

## 2013-05-20 MED ORDER — ALUM & MAG HYDROXIDE-SIMETH 200-200-20 MG/5ML PO SUSP: 30.0000 mL | ORAL | Status: DC | PRN

## 2013-05-20 MED ORDER — ACETAMINOPHEN 325 MG PO TABS
650.0000 mg | ORAL_TABLET | ORAL | Status: DC | PRN
  Administered 2013-05-23: 650 mg via ORAL
  Filled 2013-05-20: qty 2

## 2013-05-20 MED ORDER — SODIUM CHLORIDE 0.9 % IV SOLN
Freq: Once | INTRAVENOUS | Status: AC
  Administered 2013-05-20: 22:00:00 via INTRAVENOUS

## 2013-05-20 NOTE — ED Notes (Signed)
Pt refused for writer to get VS, pt sts "if you don't stay the fuck away from me!"  Writer said ok, and notified RN

## 2013-05-20 NOTE — BH Assessment (Signed)
Assessment Note  [name] is an 62 y.o. female who presents to the ED under IVC from her nursing home facility due to increasing aggressive behavior towards facility staff.  CSW was unable to complete the assessment with pt because she reported, "I am already dead, so you need to leave me the f---k alone, and let me rest in peace.  When CSW approached pts room, she was having a very loud conversation, although there was no one present with her in the room.  Pt reported that "I'm talking to these people who keep following me around even though they already killed me".  Pt reported that she had been dead "many times", but "I come back because I am Jesus Christ".  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  Pt reported that "nurses, doctors, therapists, CNAs, and techs all keep killing me".  Pt asked CSW to call [phone] to speak with a psychiatrist, but that he wasn't her psychiatrist".   Pt refused to complete the assessment after CSW could not give her "snuff and chewing tobacco".  "I am a doctor of everything, and I don't need you here, get the F---K out!"  CSW attempted to call [facility] 3x, but could not get an answer or leave a message, so CSW unable to gather collateral information concerning whether patient's behavior has changed or if she is at her baseline.  CSW recommends that pt be re-evaluated psych for disposition and stabilization recommendations.  CSW recommends that daytime CSW/TTS follow up with facility to gather collateral information.  Axis I: Psychotic Disorder NOS Axis II: Deferred Axis III:  Past Medical History  Diagnosis Date  . Schizoaffective disorder   . Urinary tract infection   . Hypertension   . GERD (gastroesophageal reflux disease)   . Hypothyroidism   . Obesity   . Asthma   . COPD (chronic obstructive pulmonary disease)   . CHF (congestive heart failure)   . Seizures   . Morbid obesity   . Chronic pain   .  Hypercholesteremia   . Anginal pain   . Myocardial infarction 2002  . Diabetes mellitus     "I'm not diabetic anymore" (05/23/2012)  . Anemia   . History of blood transfusion 1993    "when I had my hysterectomy" (05/23/2012)  . H/O hiatal hernia   . Daily headache   . Arthritis     "severe; all over my body" (05/23/2012)  . Hypoventilation syndrome     [name] 05/30/2012  . Atrial fibrillation   . Atrial flutter, paroxysmal     [name] 05/30/2012  . Exertional dyspnea   . Shortness of breath     "all the time lately" (05/30/2012)  . Pneumonia     "several times; including now" (04/23/2013)   Axis IV: other psychosocial or environmental problems and problems related to social environment Axis V: 21-30 behavior considerably influenced by delusions or hallucinations OR serious impairment in judgment, communication OR inability to function in almost all areas  Past Medical History:  Past Medical History  Diagnosis Date  . Schizoaffective disorder   . Urinary tract infection   . Hypertension   . GERD (gastroesophageal reflux disease)   . Hypothyroidism   . Obesity   . Asthma   . COPD (chronic obstructive pulmonary disease)   . CHF (congestive heart failure)   . Seizures   . Morbid obesity   . Chronic pain   . Hypercholesteremia   .  Anginal pain   . Myocardial infarction 2002  . Diabetes mellitus     "I'm not diabetic anymore" (05/23/2012)  . Anemia   . History of blood transfusion 1993    "when I had my hysterectomy" (05/23/2012)  . H/O hiatal hernia   . Daily headache   . Arthritis     "severe; all over my body" (05/23/2012)  . Hypoventilation syndrome     [name] 05/30/2012  . Atrial fibrillation   . Atrial flutter, paroxysmal     [name] 05/30/2012  . Exertional dyspnea   . Shortness of breath     "all the time lately" (05/30/2012)  . Pneumonia     "several times; including now" (04/23/2013)    Past Surgical History  Procedure Laterality Date  . Tubal ligation   1998  . Cholecystectomy  ?2008  . Cardiac catheterization  2008  . Vaginal hysterectomy  1993    Family History:  Family History  Problem Relation Age of Onset  . Heart disease Father   . Cancer Mother 79    Breast    Social History:  reports that she quit smoking about 12 years ago. Her smoking use included Cigarettes. She has a 24 pack-year smoking history. Her smokeless tobacco use includes Chew. She reports that she does not drink alcohol or use illicit drugs.  Additional Social History:     CIWA: CIWA-Ar BP: 164/76 mmHg Pulse Rate: 116 COWS:    Allergies:  Allergies  Allergen Reactions  . Food Rash    "lasagna"= rash from the noodles  . Penicillins Hives  . Sulfamethoxazole-Trimethoprim Hives and Nausea And Vomiting    Home Medications:  (Not in a hospital admission)  OB/GYN Status:  No LMP recorded. Patient has had a hysterectomy.  General Assessment Data Location of Assessment: [facility] ED Is this a Tele or Face-to-Face Assessment?: Face-to-Face Is this an Initial Assessment or a Re-assessment for this encounter?: Initial Assessment Living Arrangements: Other (Comment) [facility]) Can pt return to current living arrangement?:  (unknown- unable to reach facility by phone) Admission Status: Involuntary Is patient capable of signing voluntary admission?: No Transfer from: Nsg Home Referral Source: Other Landscape architect)     [facility] Crisis Care Plan Living Arrangements: Other (Comment) [facility])     Risk to self Suicidal Ideation: No Suicidal Intent: No Is patient at risk for suicide?: No Suicidal Plan?: No Access to Means:  (UTA) What has been your use of drugs/alcohol within the last 12 months?:  (UTA) Previous Attempts/Gestures:  (UTA) How many times?:  (UTA) Other Self Harm Risks:  (UTA) Triggers for Past Attempts:  (UTA) Intentional Self Injurious Behavior:  (UTA) Family Suicide History:   (UTA) Recent stressful life event(s):  (UTA) Persecutory voices/beliefs?: Yes ("Everybody killing me".) Depression:  (UTA) Depression Symptoms:  (UTA) Substance abuse history and/or treatment for substance abuse?:  (UTA)  Risk to Others Homicidal Ideation: No Thoughts of Harm to Others: No Current Homicidal Intent: No Current Homicidal Plan: No Access to Homicidal Means:  (UTA) Identified Victim: none History of harm to others?:  (UTA) Assessment of Violence: In past 6-12 months Violent Behavior Description:  (Throwing things at facility staff members) Does patient have access to weapons?:  (UTA) Criminal Charges Pending?:  (UTA)  Psychosis Hallucinations: Auditory;Visual Delusions: Grandiose;Persecutory  Mental Status Report Appear/Hygiene: Disheveled;Poor hygiene Eye Contact: Poor Motor Activity: Agitation;Freedom of movement;Restlessness Speech: Aggressive;Argumentative;Loud;Tangential Level of Consciousness: Irritable Mood: Angry Affect: Angry Anxiety Level: None  Thought Processes: Tangential;Flight of Ideas Judgement: Impaired Orientation: Person;Place Obsessive Compulsive Thoughts/Behaviors:  (UTA)  Cognitive Functioning Concentration:  (UTA) Memory:  (UTA) IQ: Average Insight: Poor Impulse Control: Poor Appetite:  (Pt says no one feeds her) Weight Loss:  (UTA) Weight Gain:  (UTA) Sleep:  (UTA) Total Hours of Sleep:  (UTA) Vegetative Symptoms:  (UTA)  ADLScreening [facility] Assessment Services) Patient's cognitive ability adequate to safely complete daily activities?: No Patient able to express need for assistance with ADLs?: Yes Independently performs ADLs?:  (UTA)  Prior Inpatient Therapy Prior Inpatient Therapy:  (UTA) Prior Therapy Dates:  (UTA) Prior Therapy Facilty/Provider(s):  (UTA) Reason for Treatment:  (UTA)  Prior Outpatient Therapy Prior Outpatient Therapy:  (UTA) Prior Therapy Dates:  (UTA) Prior Therapy Facilty/Provider(s):   (UTA) Reason for Treatment:  (UTA)  ADL Screening (condition at time of admission) Patient's cognitive ability adequate to safely complete daily activities?: No Patient able to express need for assistance with ADLs?: Yes Independently performs ADLs?:  (UTA)         Values / Beliefs Cultural Requests During Hospitalization: None Spiritual Requests During Hospitalization: None        Additional Information 1:1 In Past 12 Months?:  (UTA) CIRT Risk:  (UTA) Elopement Risk:  (UTA) Does patient have medical clearance?: Yes     Disposition:  Disposition Initial Assessment Completed for this Encounter: Yes Disposition of Patient: Other dispositions (Disposition pending psych evaluation) Other disposition(s):  (Disposition pending psych evaluation)  On Site Evaluation by:   Reviewed with Physician:    Lexine Baton 05/20/2013 10:47 PM

## 2013-05-20 NOTE — ED Notes (Addendum)
Per EMS, pt is IVC, coming from [facility]. Pt has history of schizophrenia.  Pt states she "died at 9AM". Pt cooperative for EMS.

## 2013-05-20 NOTE — Progress Notes (Addendum)
CSW attempted three times  to contact [facility] where pt currently is a resident to obtain collateral information concerning pts behaviors.   [phone] Telephone just rang, there was no answer so CSW was not able to leave a message.    Marva Panda, LCSWA  [phone]  .05/20/2013 10:15 pm

## 2013-05-20 NOTE — ED Notes (Signed)
Bed: WA12 Expected date:  Expected time:  Means of arrival:  Comments: ems 
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
## 2013-05-21 ENCOUNTER — Encounter (HOSPITAL_COMMUNITY): Payer: Self-pay | Admitting: Emergency Medicine

## 2013-05-21 DIAGNOSIS — F29 Unspecified psychosis not due to a substance or known physiological condition: Secondary | ICD-10-CM

## 2013-05-21 LAB — GLUCOSE, CAPILLARY
Glucose-Capillary: 98 mg/dL (ref 70–99)
Glucose-Capillary: 99 mg/dL (ref 70–99)

## 2013-05-21 MED ORDER — HALOPERIDOL LACTATE 5 MG/ML IJ SOLN
2.0000 mg | Freq: Four times a day (QID) | INTRAMUSCULAR | Status: DC | PRN
  Administered 2013-05-21 (×2): 2 mg via INTRAMUSCULAR
  Filled 2013-05-21 (×2): qty 1

## 2013-05-21 MED ORDER — RISPERIDONE 2 MG PO TABS
3.0000 mg | ORAL_TABLET | Freq: Two times a day (BID) | ORAL | Status: DC
  Administered 2013-05-21 – 2013-05-23 (×4): 3 mg via ORAL
  Filled 2013-05-21 (×8): qty 1

## 2013-05-21 MED ORDER — METOPROLOL SUCCINATE ER 50 MG PO TB24
50.0000 mg | ORAL_TABLET | Freq: Every day | ORAL | Status: DC
  Administered 2013-05-21 – 2013-05-23 (×3): 50 mg via ORAL
  Filled 2013-05-21 (×3): qty 1

## 2013-05-21 MED ORDER — RISPERIDONE 1 MG PO TABS
ORAL_TABLET | ORAL | Status: AC
  Administered 2013-05-21: 1 mg via ORAL
  Filled 2013-05-21: qty 1

## 2013-05-21 MED ORDER — ALBUTEROL SULFATE HFA 108 (90 BASE) MCG/ACT IN AERS: 2.0000 | INHALATION_SPRAY | Freq: Four times a day (QID) | RESPIRATORY_TRACT | Status: DC | PRN

## 2013-05-21 MED ORDER — BUDESONIDE 0.25 MG/2ML IN SUSP
0.2500 mg | Freq: Two times a day (BID) | RESPIRATORY_TRACT | Status: DC
  Administered 2013-05-21 – 2013-05-23 (×6): 0.25 mg via RESPIRATORY_TRACT
  Filled 2013-05-21 (×6): qty 2

## 2013-05-21 MED ORDER — RISPERIDONE 1 MG PO TABS
1.0000 mg | ORAL_TABLET | Freq: Two times a day (BID) | ORAL | Status: DC
  Administered 2013-05-21 (×2): 1 mg via ORAL
  Filled 2013-05-21: qty 1

## 2013-05-21 MED ORDER — CEPHALEXIN 500 MG PO CAPS
500.0000 mg | ORAL_CAPSULE | Freq: Three times a day (TID) | ORAL | Status: DC
  Administered 2013-05-21 – 2013-05-23 (×9): 500 mg via ORAL
  Filled 2013-05-21 (×8): qty 1

## 2013-05-21 MED ORDER — CEPHALEXIN 500 MG PO CAPS
ORAL_CAPSULE | ORAL | Status: AC
  Administered 2013-05-21: 500 mg via ORAL
  Filled 2013-05-21: qty 1

## 2013-05-21 MED ORDER — LEVOTHYROXINE SODIUM 150 MCG PO TABS
150.0000 ug | ORAL_TABLET | Freq: Every day | ORAL | Status: DC
  Administered 2013-05-21 – 2013-05-23 (×3): 150 ug via ORAL
  Filled 2013-05-21 (×4): qty 1

## 2013-05-21 MED ORDER — DIVALPROEX SODIUM ER 250 MG PO TB24
250.0000 mg | ORAL_TABLET | Freq: Every day | ORAL | Status: DC
  Administered 2013-05-21 – 2013-05-22 (×3): 250 mg via ORAL
  Filled 2013-05-21 (×4): qty 1

## 2013-05-21 MED ORDER — ASPIRIN EC 81 MG PO TBEC
81.0000 mg | DELAYED_RELEASE_TABLET | Freq: Every morning | ORAL | Status: DC
  Administered 2013-05-21 – 2013-05-23 (×3): 81 mg via ORAL
  Filled 2013-05-21 (×3): qty 1

## 2013-05-21 NOTE — Progress Notes (Signed)
Pt declined at [facility] due to acuity.   Pt still pending review at [facility].  Per Nicholos Johns it pts information should be reviewed by 05/22/13 am.  [facility] at capacity. [facility] at capacity.  Referred pt to [facility].  Pending review. Referred pt to [facility].  Pending review.  Marva Panda, Theresia Majors  [phone]  .05/21/2013  Pt denied at [facility] due to acuity. Pt denied at [facility] due to medical acuity.  Marva Panda, Theresia Majors  [phone]  .05/21/2013  CSW contacted. [facility].  Per NP Julieanne Cotton, [facility] does not accept geri-psych pts unless they come through [facility] ED.

## 2013-05-21 NOTE — Progress Notes (Signed)
Contacted [place]- Per [employer], pt. Referral Will be presented to the Dr. Shortly. Rodman Pickle, MHT
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
## 2013-05-21 NOTE — ED Provider Notes (Signed)
7:54 AM Patient agitated, yelling nonsensically, and she is not re-directable. Review of chart demonstrates the prior psych consult has recommended Haldol 2IM q6 for agitation. Meds ordered.   Gerhard Munch, MD 05/21/13 [phone]

## 2013-05-21 NOTE — ED Notes (Signed)
Writer gave pt a diet coke, pt is still screaming erratically

## 2013-05-21 NOTE — Progress Notes (Addendum)
Pt recommended for inpatient treatment, and currently under IVC.   Pt referred to [facility], pending review.  Pt referred to [facility] pending review.    No beds available at [place], [facility].

## 2013-05-21 NOTE — ED Notes (Signed)
Patient has one bag of belongings in locker 29. 
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
## 2013-05-21 NOTE — Progress Notes (Addendum)
Pt declined at [facility] due to acuity.   Pt still pending review at [facility].  Per Nicholos Johns it pts information should be reviewed by 05/22/13 am.  [facility] at capacity. [facility] at capacity.  Referred pt to [facility].  Pending review. Referred pt to [facility].  Pending review.  [name], Theresia Majors  [phone]  .05/21/2013  Pt denied at [facility] due to acuity. Pt denied at [facility] due to medical acuity.  [name], Theresia Majors  [phone]  .05/21/2013  CSW contacted. [facility].  Left voicemail.  Referred pt.

## 2013-05-22 LAB — GLUCOSE, CAPILLARY
Glucose-Capillary: 107 mg/dL — ABNORMAL HIGH (ref 70–99)
Glucose-Capillary: 121 mg/dL — ABNORMAL HIGH (ref 70–99)
Glucose-Capillary: 95 mg/dL (ref 70–99)
Glucose-Capillary: 99 mg/dL (ref 70–99)

## 2013-05-22 NOTE — Progress Notes (Signed)
CSW spoke with Pam at [facility].  Pt has been accepted pending discharge in the am.  Daytime CSW will follow up in the am.  CSW reported acceptance to [facility].  Marland KitchenMarva Panda, Theresia Majors  [phone]  .05/22/2013

## 2013-05-22 NOTE — Consult Note (Signed)
  Psychiatric Specialty Exam: Physical Exam  ROS  Blood pressure 131/81, pulse 78, temperature 97.8 F (36.6 C), temperature source Axillary, resp. rate 18, SpO2 100.00%.There is no weight on file to calculate BMI.  General Appearance: Casual  Eye Contact::  Absent  Speech:  refuses to talk other than "Leave me alone, get out of here"  Volume:  Increased  Mood:  Angry  Affect:  refused to talk  Thought Process:  refuses to talk  Orientation:  Full (Time, Place, and Person)  Thought Content:  assume she is still hearing voices as she still talks when no one is there  Suicidal Thoughts:  No  Homicidal Thoughts:  No  Memory:  Immediate;   cannot judge Recent;   cannot judge Remote;   cannot judge  Judgement:  Impaired  Insight:  Lacking  Psychomotor Activity:  stays in bed but reportedly can walk unassisted  Concentration:  Poor  Recall:  Poor  Akathisia:  Negative  Handed:  Right  AIMS (if indicated):   0  Assets:  Housing  Sleep:   good   [name] is more calm but still not cooperative.  She is not yelling out all the time. Plan is to seek an inpatient bed at whatever facility has an opening.

## 2013-05-22 NOTE — Progress Notes (Addendum)
CSW spoke with [name] at [facility], pt currently being reviewed by Dr. Yetta Barre. CSW initiating [facility] referral.   Pt denied from [facility], and [facility] due to acuity.  [facility] and [facility] at capacity.   Catha Gosselin, LCSW [phone]  ED CSW .05/22/2013 835am   CSW obtained [facility] authorization. CSW confirmed with [facility] that patient referral was received and csw completed demographics.   Catha Gosselin, LCSW [phone]  ED CSW .05/22/2013 1044am

## 2013-05-23 ENCOUNTER — Encounter (HOSPITAL_COMMUNITY): Payer: Self-pay | Admitting: Registered Nurse

## 2013-05-23 ENCOUNTER — Emergency Department (HOSPITAL_COMMUNITY): Payer: Medicare Other

## 2013-05-23 LAB — GLUCOSE, CAPILLARY
Glucose-Capillary: 103 mg/dL — ABNORMAL HIGH (ref 70–99)
Glucose-Capillary: 104 mg/dL — ABNORMAL HIGH (ref 70–99)
Glucose-Capillary: 99 mg/dL (ref 70–99)

## 2013-05-23 LAB — URINE CULTURE: Colony Count: >=100000

## 2013-05-23 NOTE — Progress Notes (Addendum)
CSW faxed completed CXR and EKG to  [place] at [facility] for review.  Per Delorise Shiner, pt accepted by Dr. Lowanda Foster.  Nurse can call report into [phone].    CSW gave information to pts nurse to call in report and coordinate transport.  CSW notified EDP that pt has been accepted to [facility].  Marva Panda, Theresia Majors  [phone]  .05/23/2013

## 2013-05-23 NOTE — Consult Note (Signed)
Note reviewed and agreed with  

## 2013-05-23 NOTE — Consult Note (Signed)
Subjective:  Patient obtunded.  During consult interview was unable to wake patient.  Patient would try to open eyes and speak but unable to get any words out or keep eyes open.  Respiratory states that patient did not wake while preparing and during breathing treatment.  Breakfast sitting at bed side not eaten.  Spoke with the patient's nurse and was informed that patient did not wake while blood sugar was checked and could not get patient to stay awake to eat breakfast.  States that patient did wake to take her synthroid but the went right back to sleep.  Nurse also stated that patient had been awake most of the night calling 911 on the phone and the phone was unplug.  Ask nurse to inform the EDP to assess just to rule out if any other medical problems may be occuring.    Will reassess when patient is awake enough to talk.   Face to face consult/interview with Dr. Claudius Sis B. Kenyan Karnes FNP-BC Family Nurse Practitioner, Board Certified   05/23/13 10:30 AM Patient sitting up in bed eating her breakfast.  Patient smiling and states "I don't have a mental problem.  Yes I was gone earlier. I hadn't had any sleep.  She brought me back to life (pointing to the nurse). Patient continues to say that she owns the hospital.  Patient states that she has 3 children and had her first child at 7 yr or. Patient appears in good spirits and very talkative and laughing at time of visit.  Will continue to monitor patient for safety and stabilization while looking for inpatient treatment bed.    Joyous Gleghorn B. Alvie Fowles FNP-BC Family Nurse Practitioner, Board Certified

## 2013-05-23 NOTE — ED Provider Notes (Signed)
9:28 AM Asked to reevaluate pt who was too sleeping for psych eval this morning. Per staff, pt was up all night yelling, calling 911.  In reviewing records, pt has had low grade temp, stable BP.  Labs show UTI.  Culture shows sensitivity to 1st gen cephalosporin, and pt has been on keflex since 12/2, so should be well covered.  On PE, lungs clear, no signs of skin infection.  After pt manipulated for PE, she is now wide awake, alert, yelling and should be able to complete psych exam. She states she was up all night because people kept waking her up & that she is pregnant.  1. Psychosis   2. UTI (urinary tract infection)      Shanna Cisco, MD 05/23/13 [phone]

## 2013-05-23 NOTE — Progress Notes (Addendum)
Per chart review, patient accepted to [facility] and anticipated to transfer this am. CSW left message for admitting at [facility] to follow up. CSW awaiting return call.    Catha Gosselin, LCSW [phone]  ED CSW .05/23/2013 7:48am   CSW spoke with Delorise Shiner at [facility], pt admission pending ekg and cxr and an available bed anticipated for later today. CXR completed. CSW spoke with NP, who ordered ekg. CSW will fax these clinicals once they are completed.   Catha Gosselin, LCSW [phone]  ED CSW .05/23/2013 1047am

## 2013-05-23 NOTE — ED Notes (Signed)
[facility] and [facility] department notified that patient was to be transported to [facility] facility.  [facility] wanted to be notified when [facility] ready to pick patient up.

## 2013-05-24 ENCOUNTER — Telehealth (HOSPITAL_COMMUNITY): Payer: Self-pay | Admitting: Emergency Medicine

## 2013-05-24 NOTE — ED Notes (Signed)
Post ED Visit - Positive Culture Follow-up  Culture report reviewed by antimicrobial stewardship pharmacist: []  Wes Dulaney, Pharm.D., BCPS [x]  Celedonio Miyamoto, Pharm.D., BCPS []  Georgina Pillion, [address].D., BCPS []  [place], [address].D., BCPS, AAHIVP []  Estella Husk, Pharm.D., BCPS, AAHIVP  Positive urine culture Treated with Keflex, organism sensitive to the same and no further patient follow-up is required at this time.  Kylie A Holland 05/24/2013, 11:40 AM

## 2013-05-25 IMAGING — CR DG CHEST 2V
2 series · 2 of 2 positions shown · non-contrast
Comparison: 01/19/2012

CLINICAL DATA: Cough.  Shortness of breath.  Chest pain.

CHEST - 2 VIEW

[x chest ap]
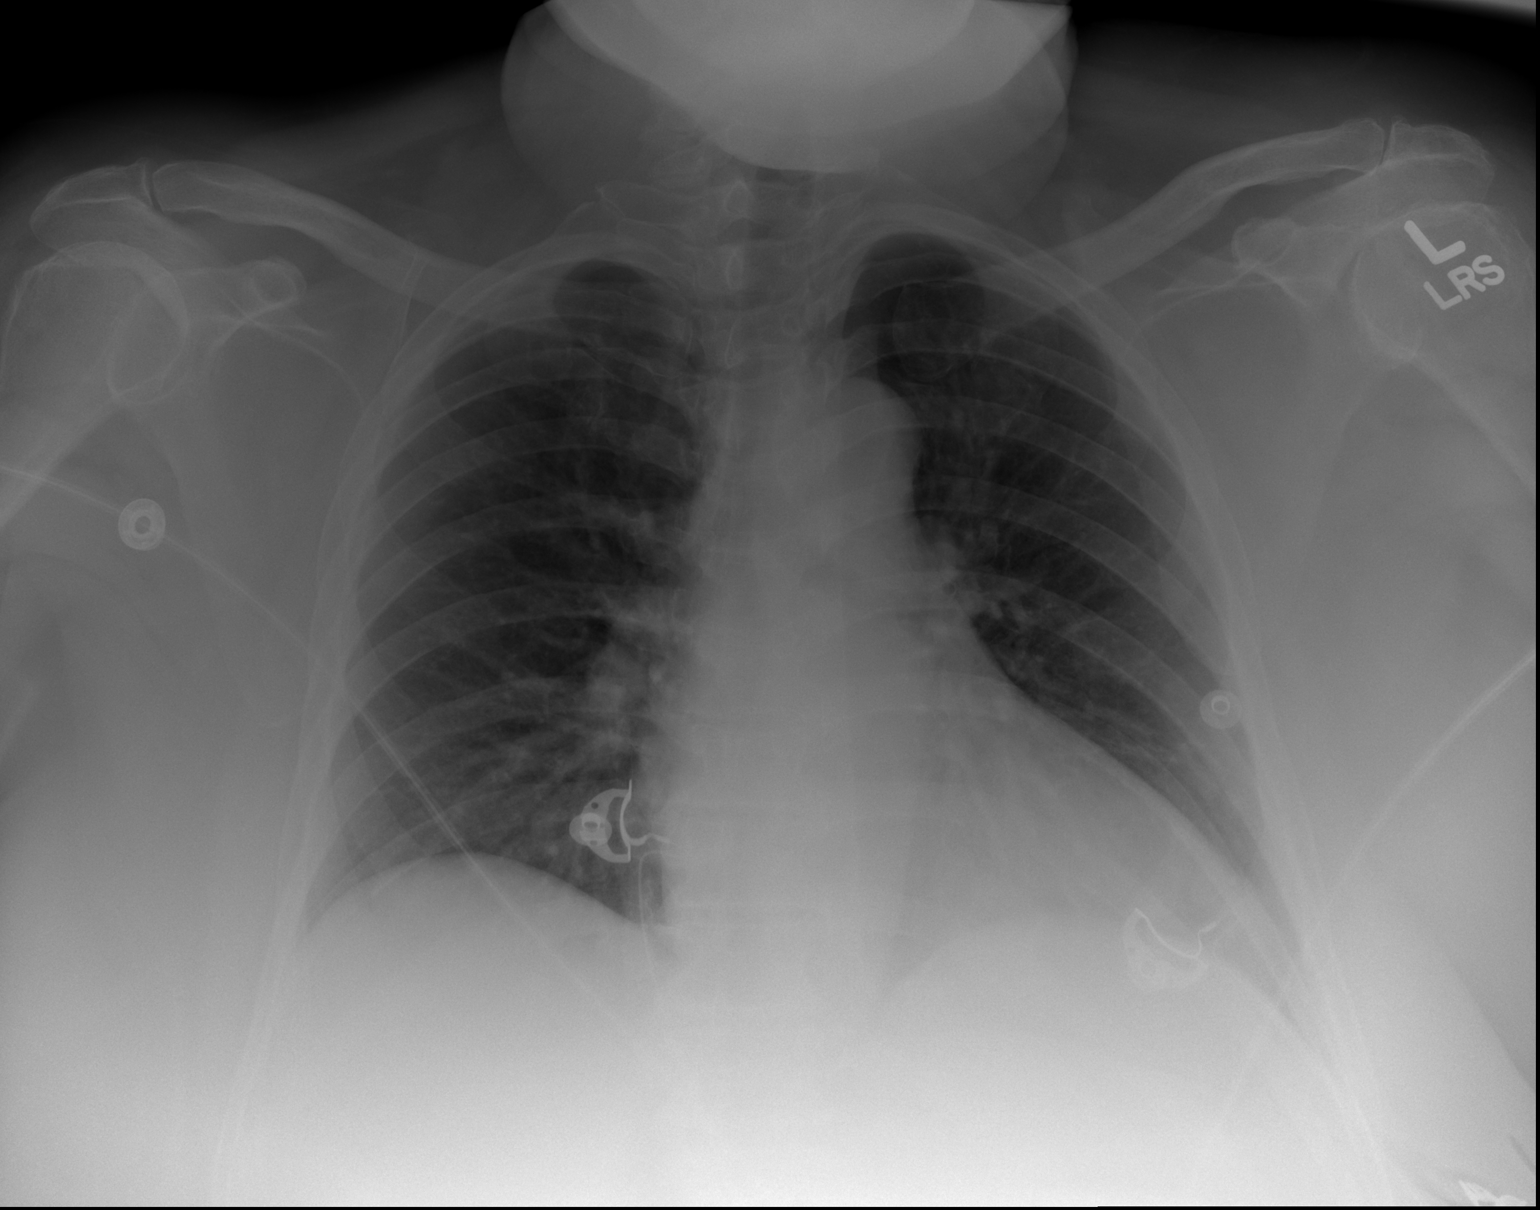

[w chest lat]
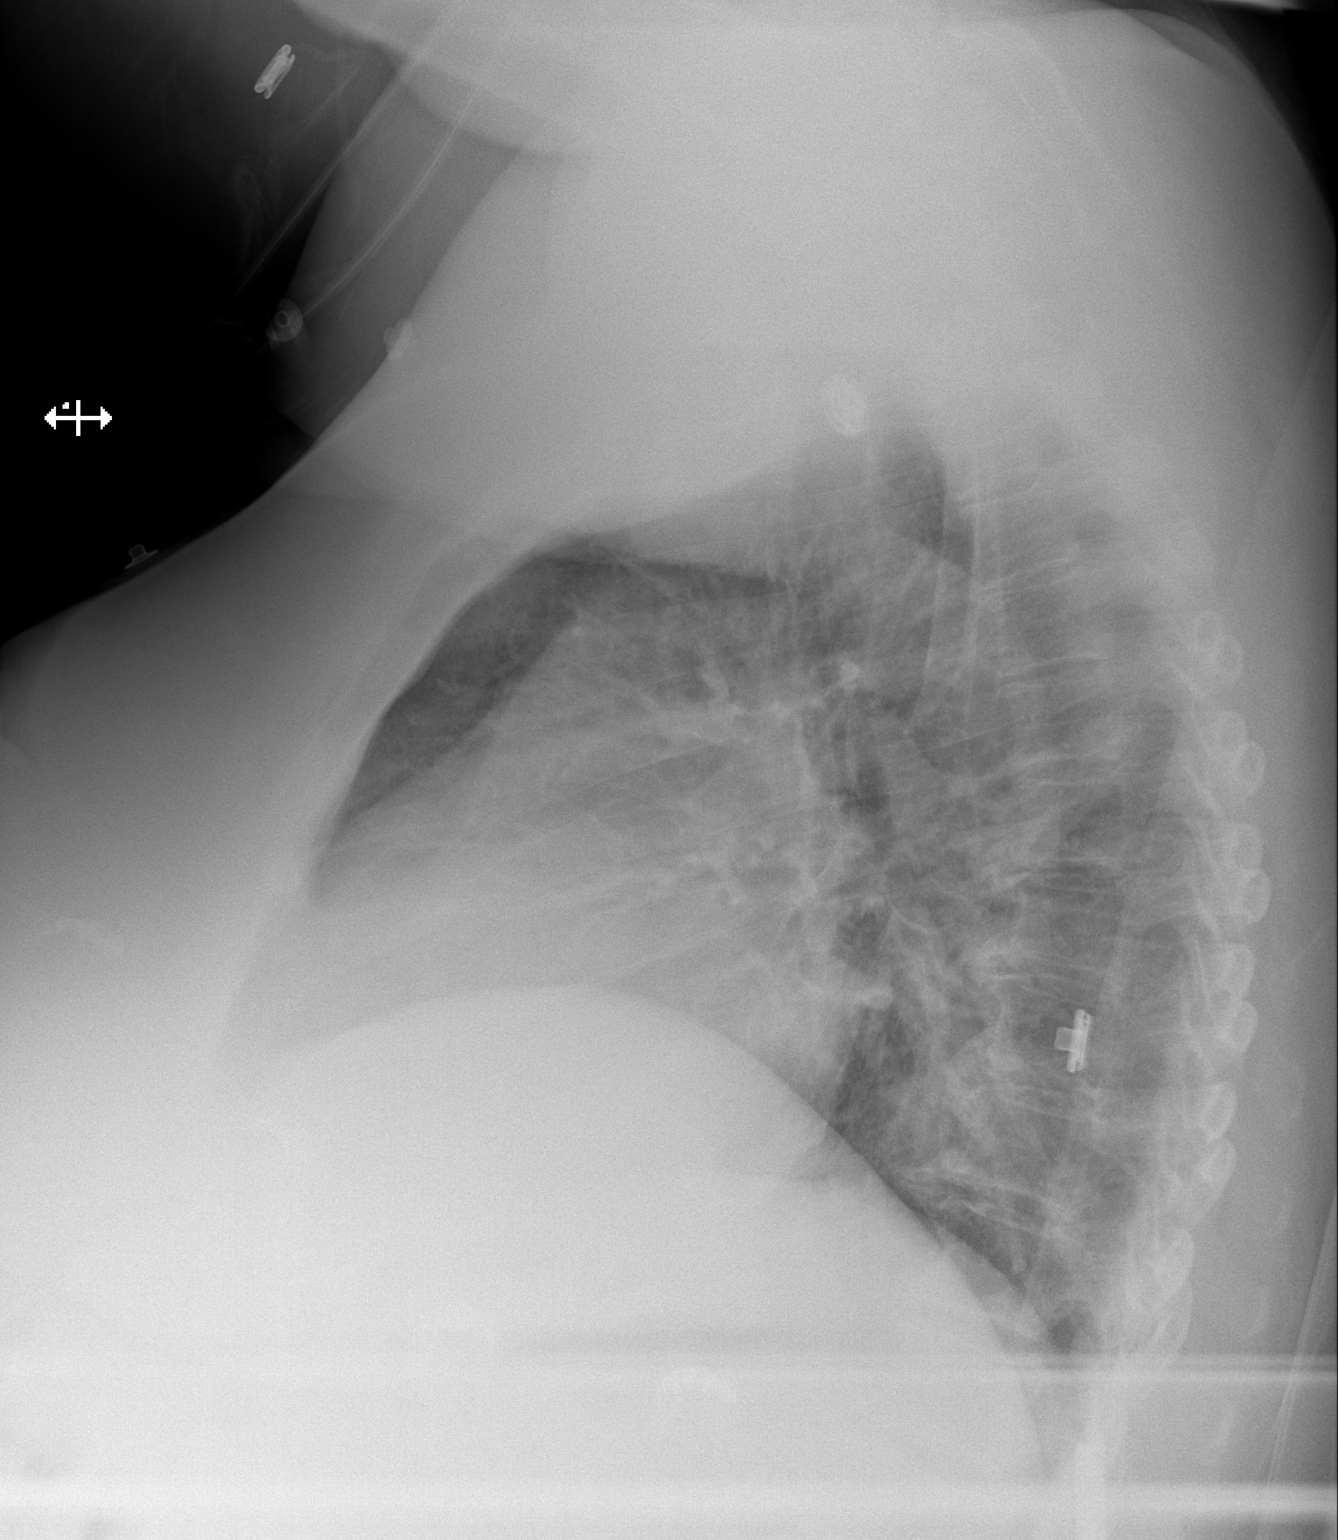

[2 of 2 positions shown; findings below may reference images not displayed]

FINDINGS: Shallow inspiration. The heart size and pulmonary
vascularity are normal. The lungs appear clear and expanded without
focal air space disease or consolidation. No blunting of the
costophrenic angles.  No pneumothorax.  Mediastinal contours appear
intact.  Degenerative changes in the spine.  No significant change
since previous study.
IMPRESSION: No evidence of active pulmonary disease.

## 2013-06-01 IMAGING — CR DG CHEST 2V
2 series · 2 of 2 positions shown · non-contrast
Comparison: 01/26/2012.

CLINICAL DATA: Short of breath.  Wheezing.

CHEST - 2 VIEW

[x chest ap]
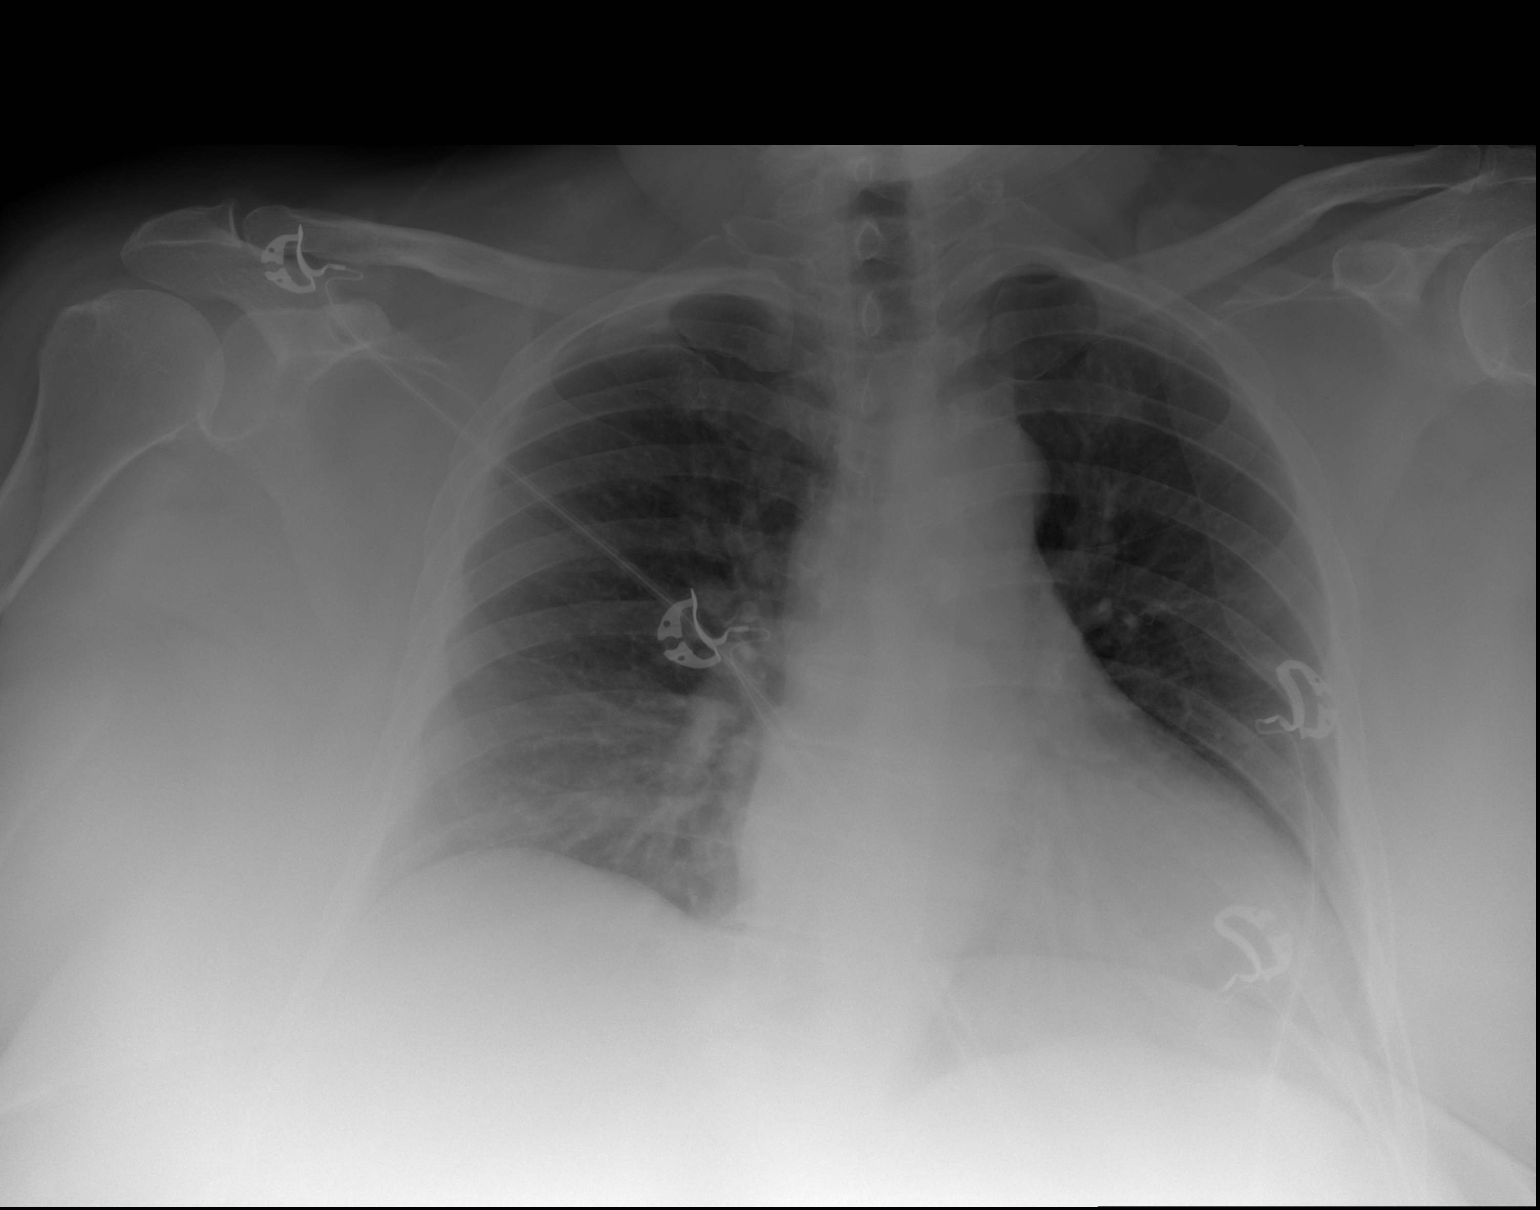

[w chest lat]
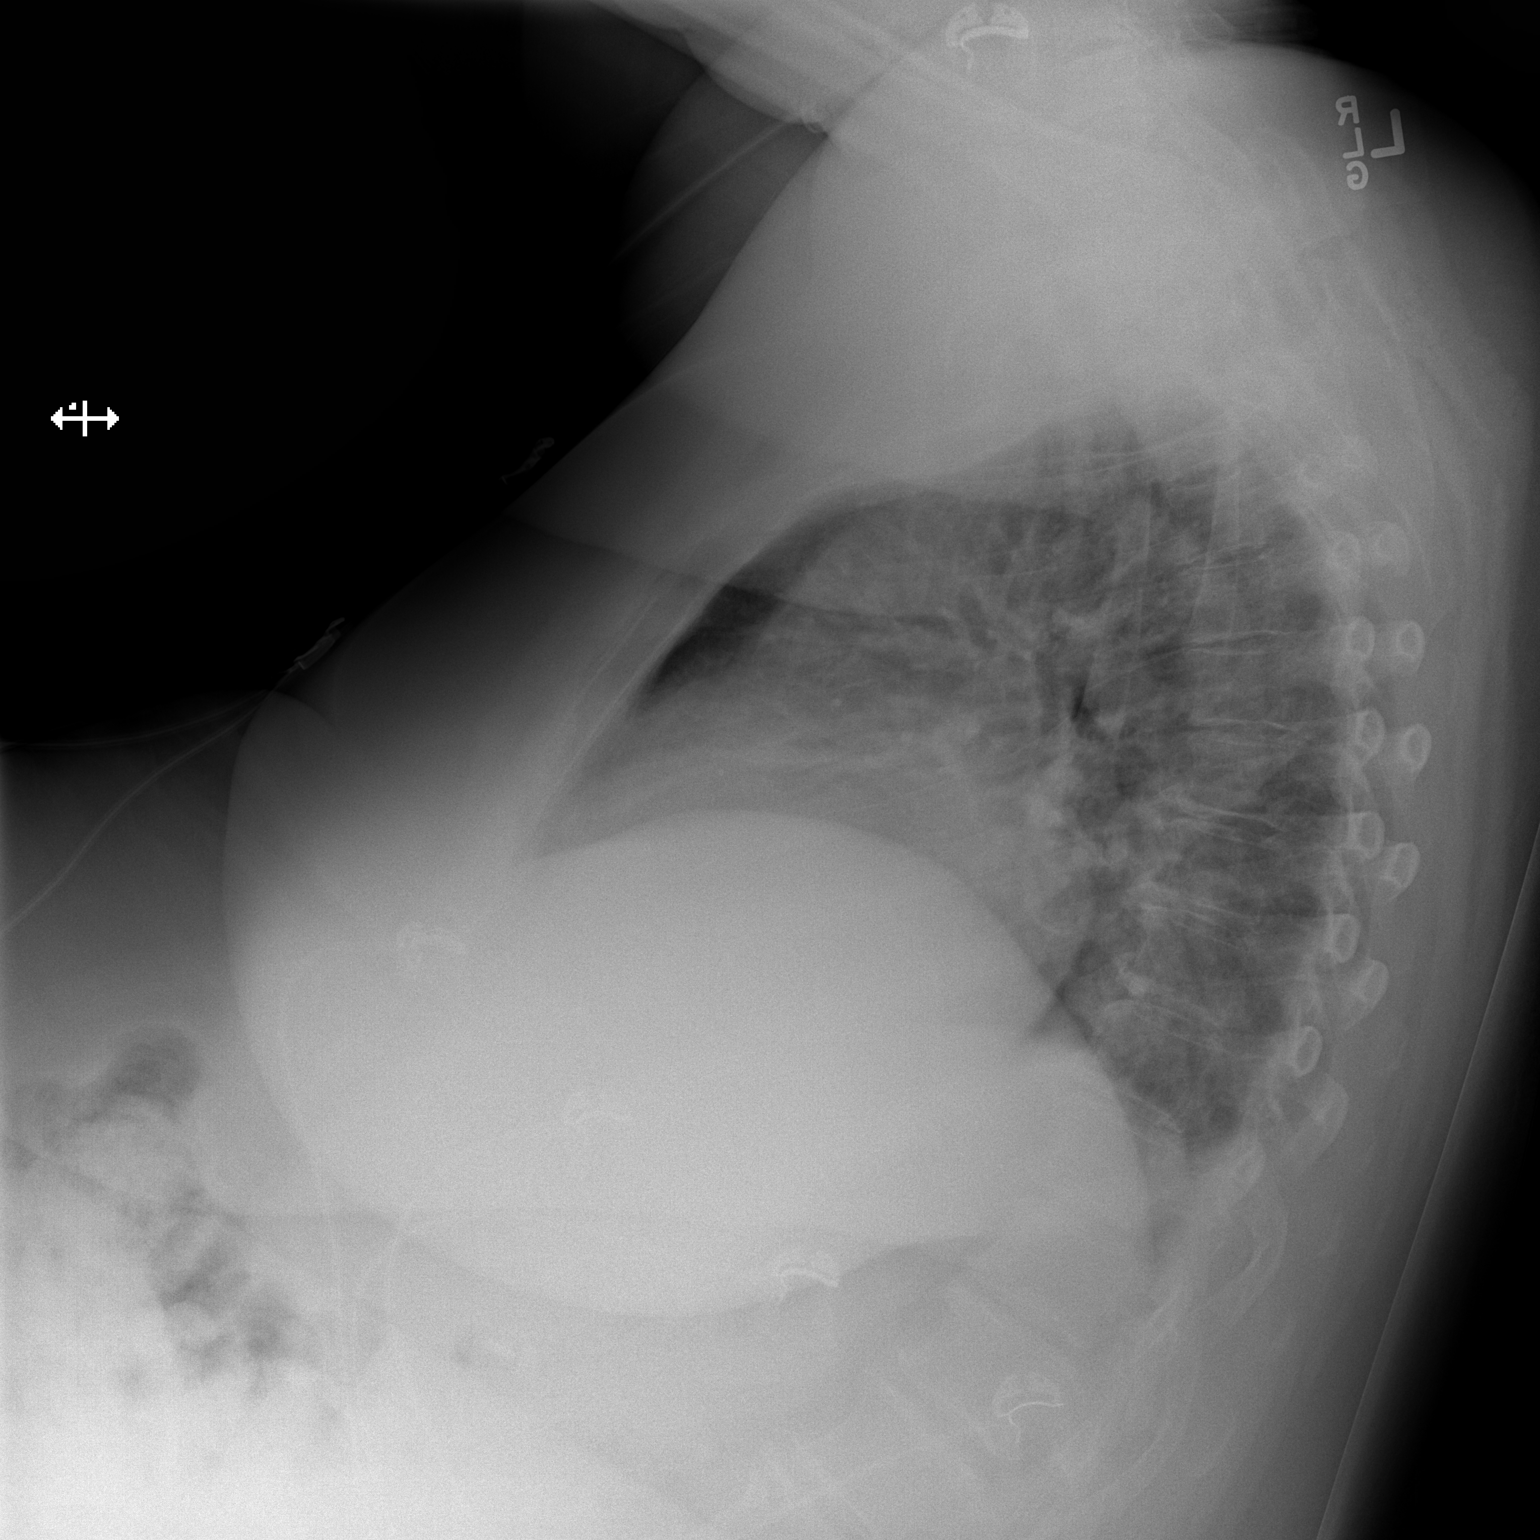

[2 of 2 positions shown; findings below may reference images not displayed]

FINDINGS: Low volume chest.  Basilar atelectasis.
Cardiopericardial silhouette within normal limits. Monitoring leads
are projected over the chest.
IMPRESSION: Low volume chest.  No acute cardiopulmonary disease.

## 2013-06-04 NOTE — Progress Notes (Signed)
Patient ID: [name], female   DOB: December 15, 1950, 62 y.o.   MRN: [id number]           PROGRESS NOTE  DATE:  05/14/2013    FACILITY: [facility]     LEVEL OF CARE:   SNF   Acute Visit   CHIEF COMPLAINT:  Increasing psychotic ideations.    HISTORY OF PRESENT ILLNESS:  This is a 62 year-old woman with a history of schizoaffective disorder.  She is on Risperdal 0.5 b.i.d.  As far as I can see, she is compliant with her medications, although she seems to have missed two doses last week in the morning.  It is not listed that she refused.    In any case, she has  apparently become much more verbal, expressing delusions of death.  Today, she stated that she was dead and had no pulse.  When I went into the room to see her, she told me she was on the way to the funeral home, but does not look particularly bothered by this.    ASSESSMENT/PLAN:  Schizoaffective disorder.  There has been some discussion about whether she will need a parenteral antipsychotic due to her noncompliance.  At this point, I do not think the evidence of this really exists.  I am, therefore, going to increase her Risperdal to 1 mg b.i.d. with the instructions to get back to [place] if there are oral medication noncompliance issues.    CPT CODE: 29562

## 2013-06-08 IMAGING — CT CT HEAD W/O CM
1 of 2 series · 16 of 30 positions shown, 20 images · non-contrast
Comparison: CT 05/17/2011

CLINICAL DATA: Recent falls.

CT HEAD WITHOUT CONTRAST
TECHNIQUE: Contiguous axial images were obtained from the base of
the skull through the vertex without contrast.

[Series 3: recon 2: brain · axial · 0.49mm/px · z∈[+104,+224]mm · 16 of 104 slices shown, 20 images]
[im 6/104  brain]
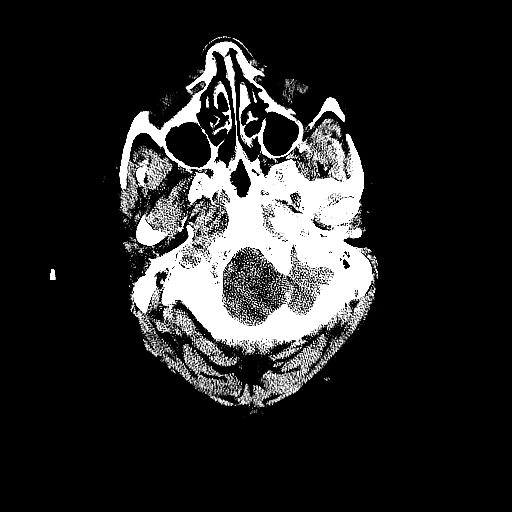
[im 6/104  bone]
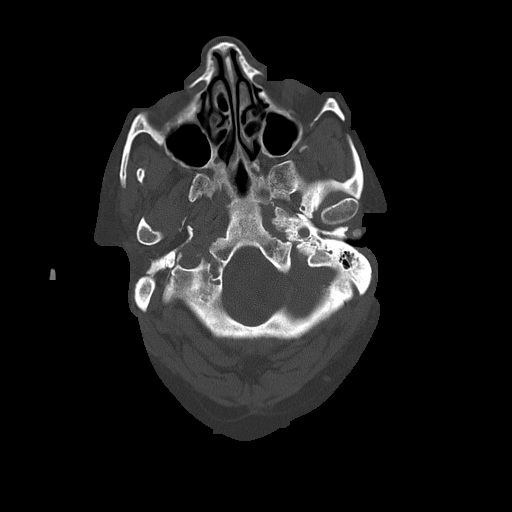
[im 11/104  brain]
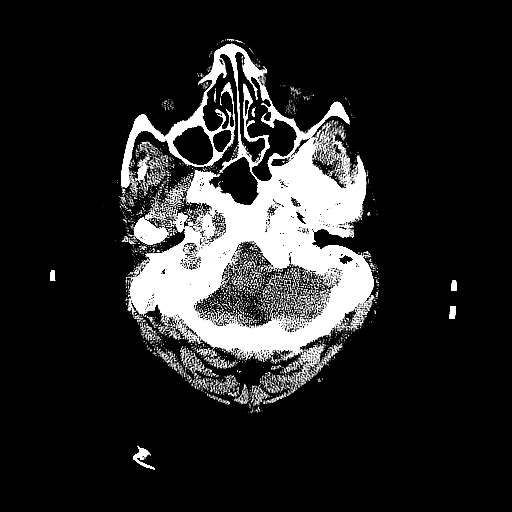
[im 17/104  brain]
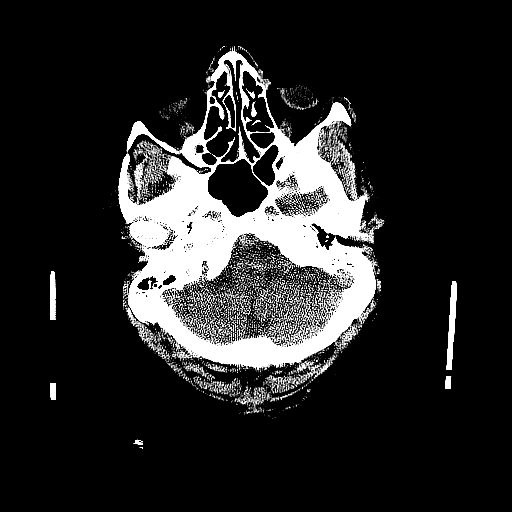
[im 22/104  brain]
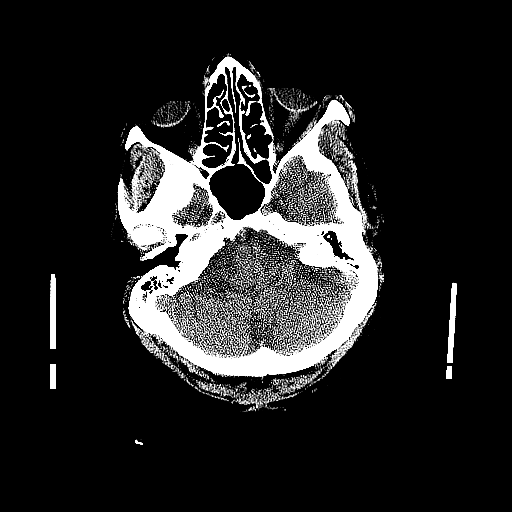
[im 33/104  brain]
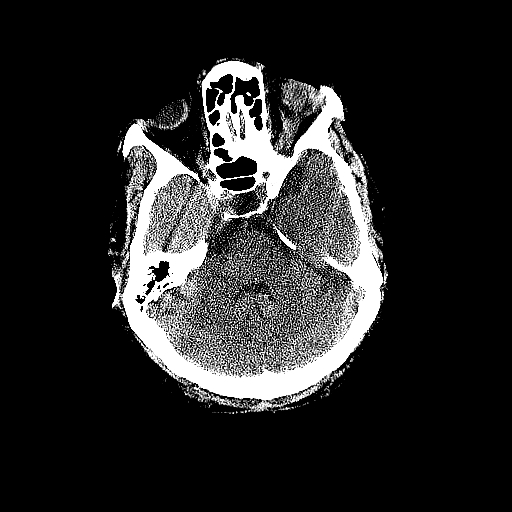
[im 33/104  bone]
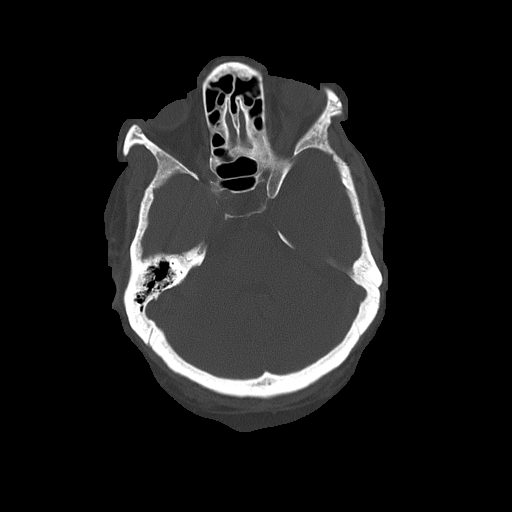
[im 38/104  brain]
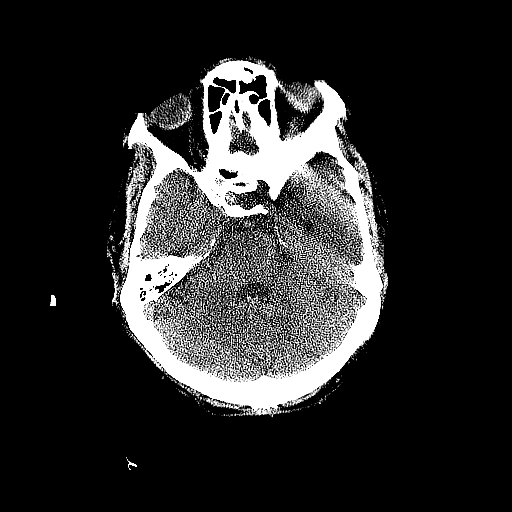
[im 44/104  brain]
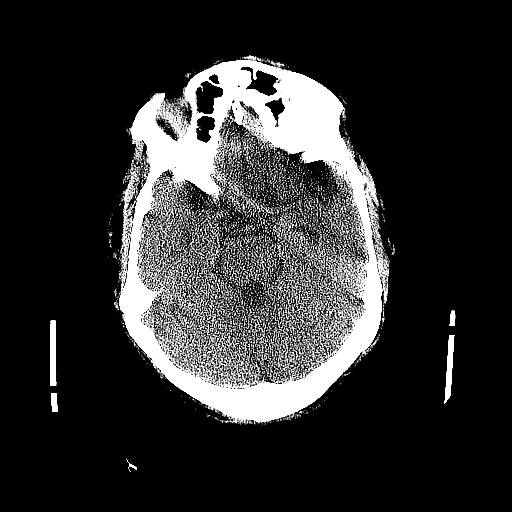
[im 49/104  brain]
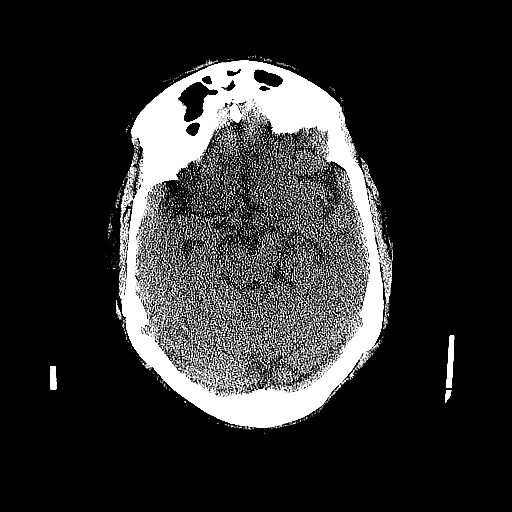
[im 55/104  brain]
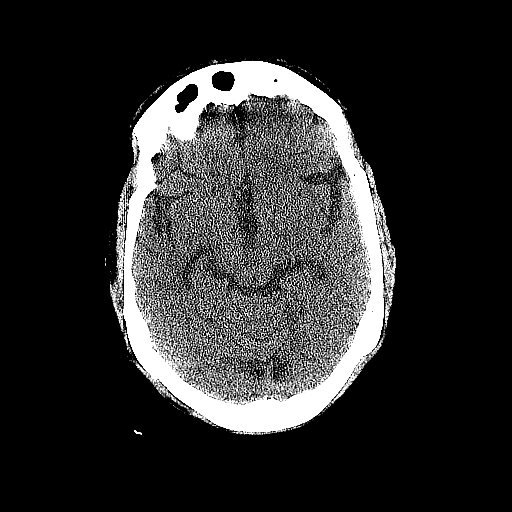
[im 55/104  bone]
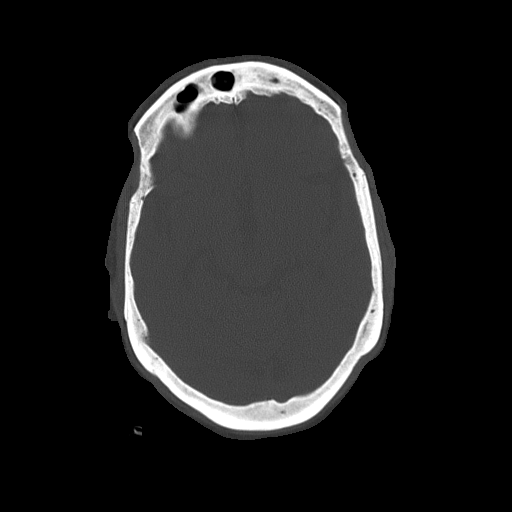
[im 60/104  brain]
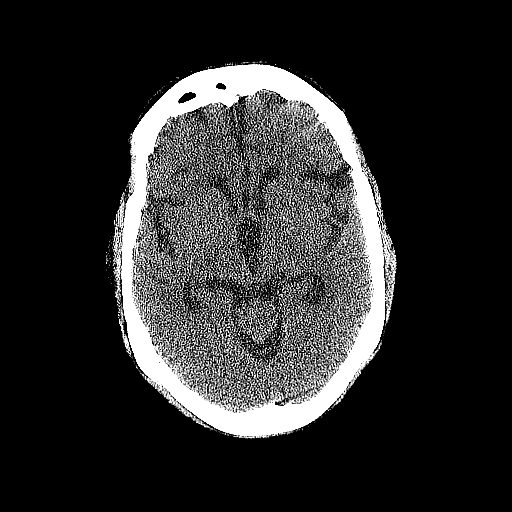
[im 66/104  brain]
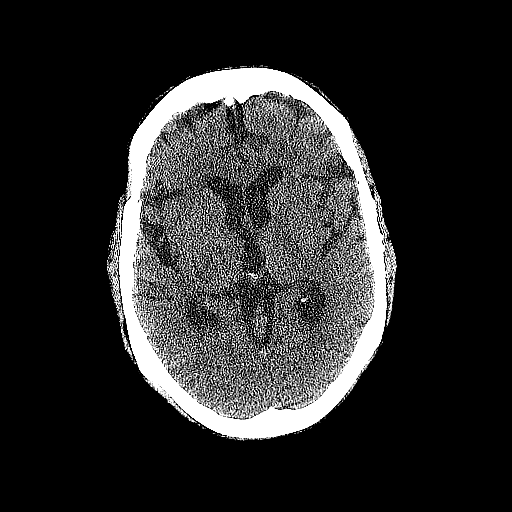
[im 71/104  brain]
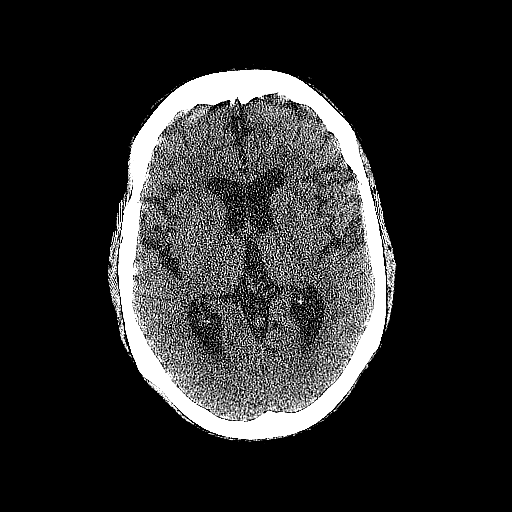
[im 82/104  brain]
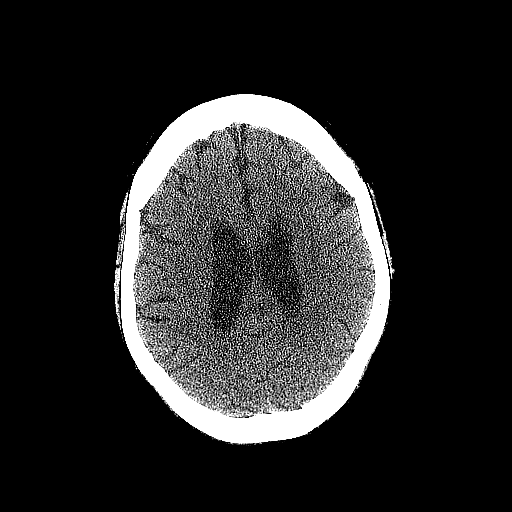
[im 82/104  bone]
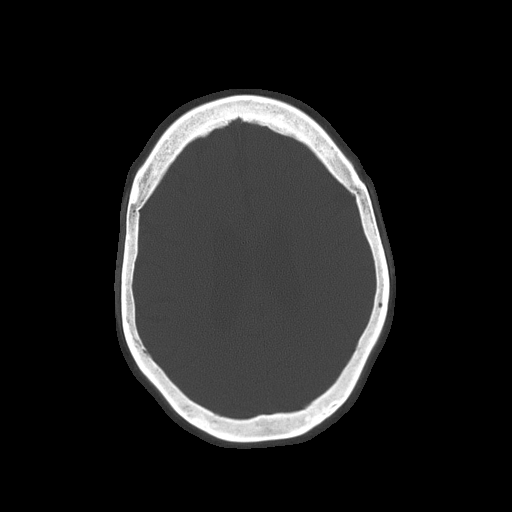
[im 87/104  brain]
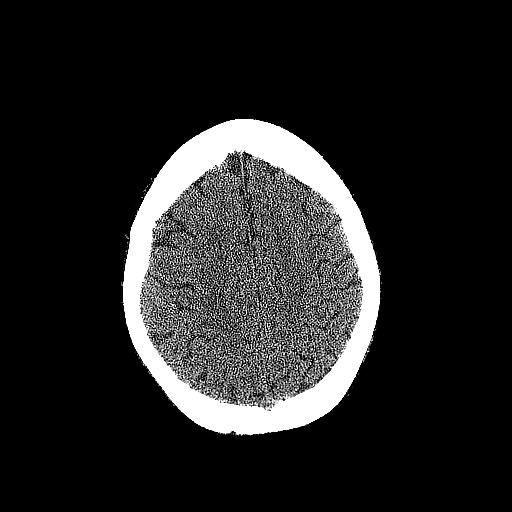
[im 93/104  brain]
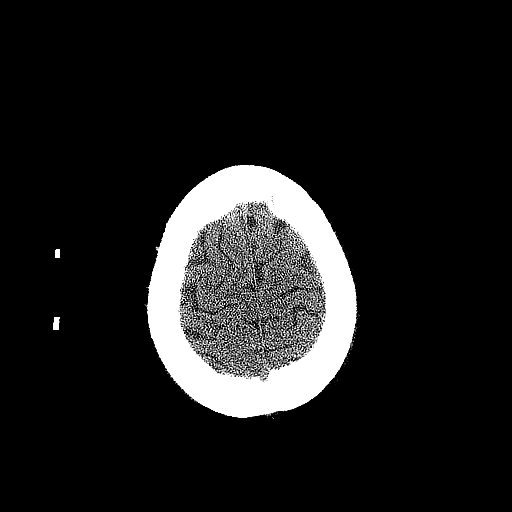
[im 98/104  brain]
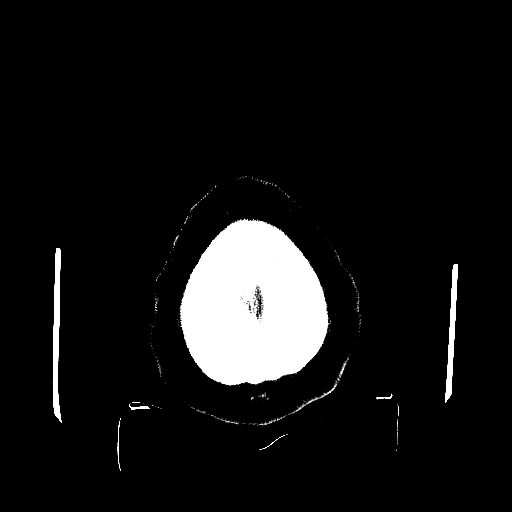

[16 of 30 positions shown; findings below may reference images not displayed]

FINDINGS: Generalized atrophy.  Negative for acute infarct.
Negative for hemorrhage mass or skull fracture.
IMPRESSION: Generalized atrophy without acute abnormality.

## 2013-06-13 IMAGING — CR DG CHEST 1V PORT
1 series · 1 of 1 positions shown · non-contrast
Comparison: 02/08/2012

CLINICAL DATA: Chest pain

PORTABLE CHEST - 1 VIEW

[AP]
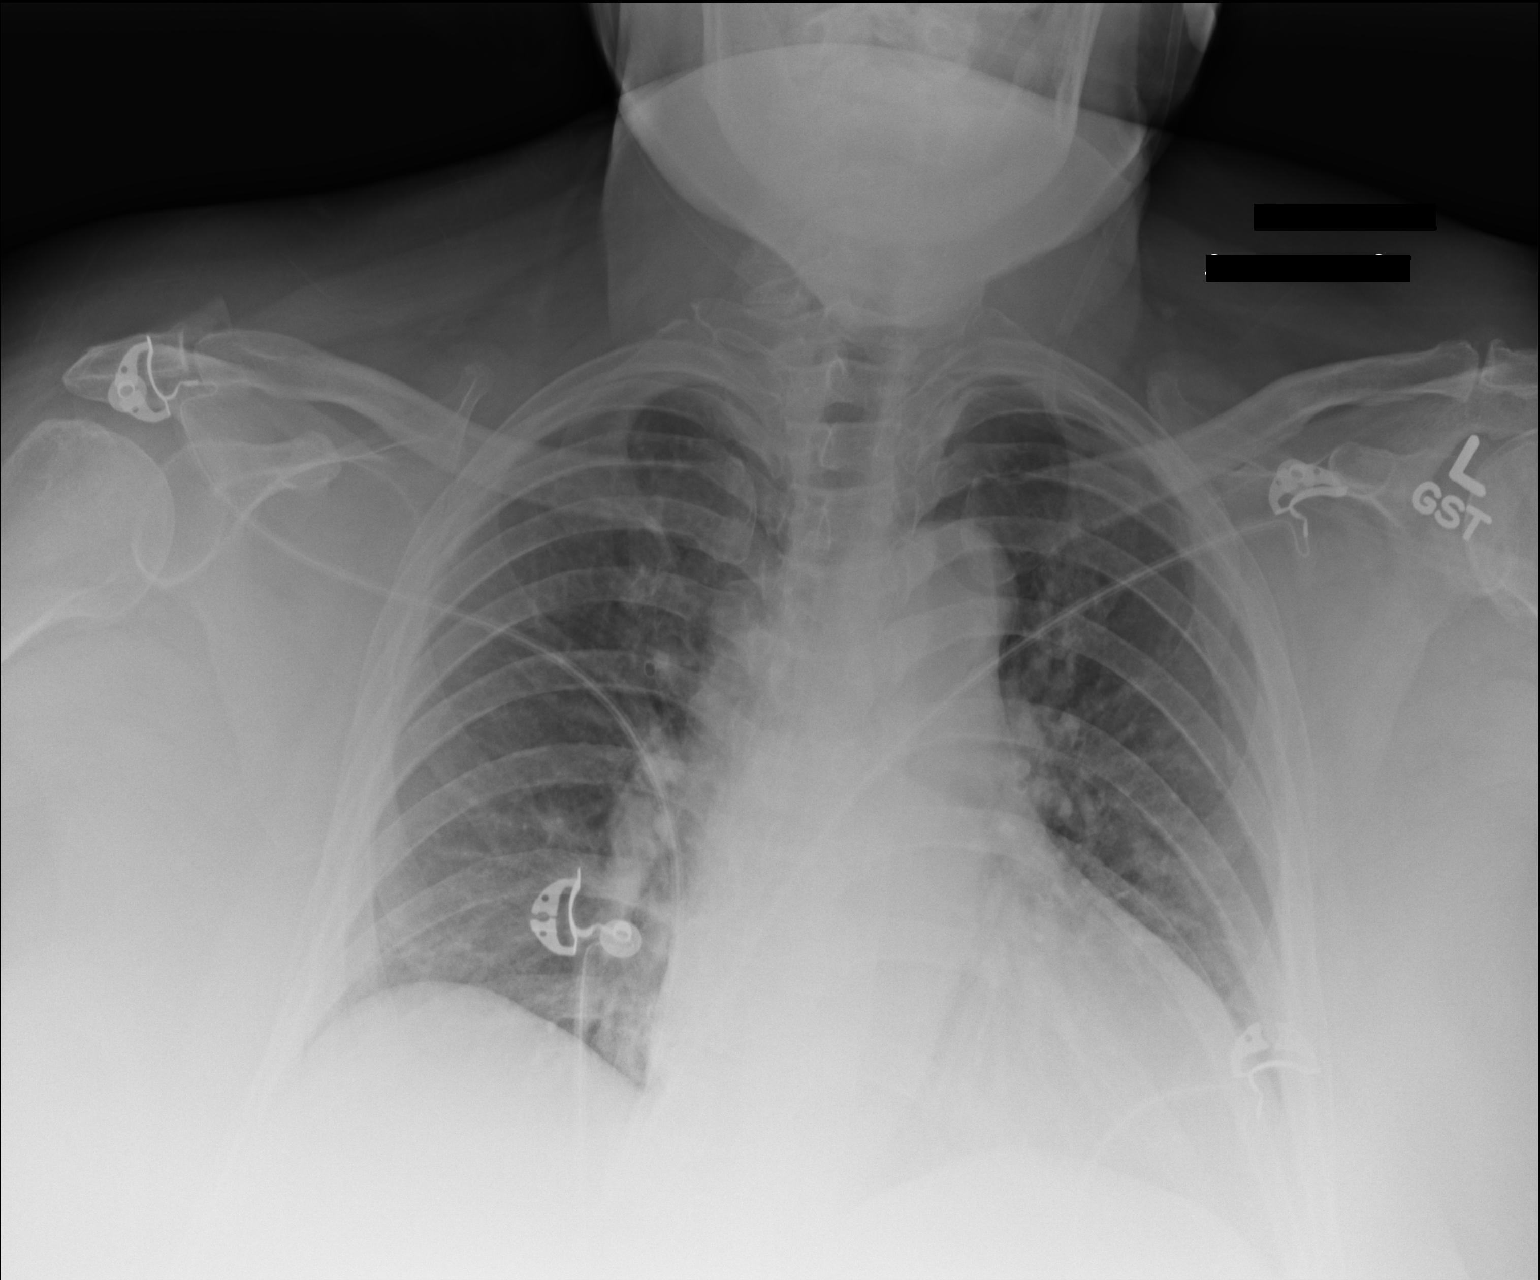

[1 of 1 positions shown; findings below may reference images not displayed]

FINDINGS: No significant interval change.  Cardiomegaly.
Mediastinal contours otherwise upper normal limits.  Mild lung base
atelectasis. Hypoaeration with interstitial and vascular crowding.
No acute osseous finding.  No pleural effusion or pneumothorax.
IMPRESSION: Cardiomegaly.

Hypoaeration with mild interstitial prominence and dependent
atelectasis.

## 2013-06-28 IMAGING — CR DG CHEST 1V PORT
1 series · 1 of 1 positions shown · non-contrast
Comparison: 02/14/2012

CLINICAL DATA: Chest pain, shortness of breath.

PORTABLE CHEST - 1 VIEW

[AP]
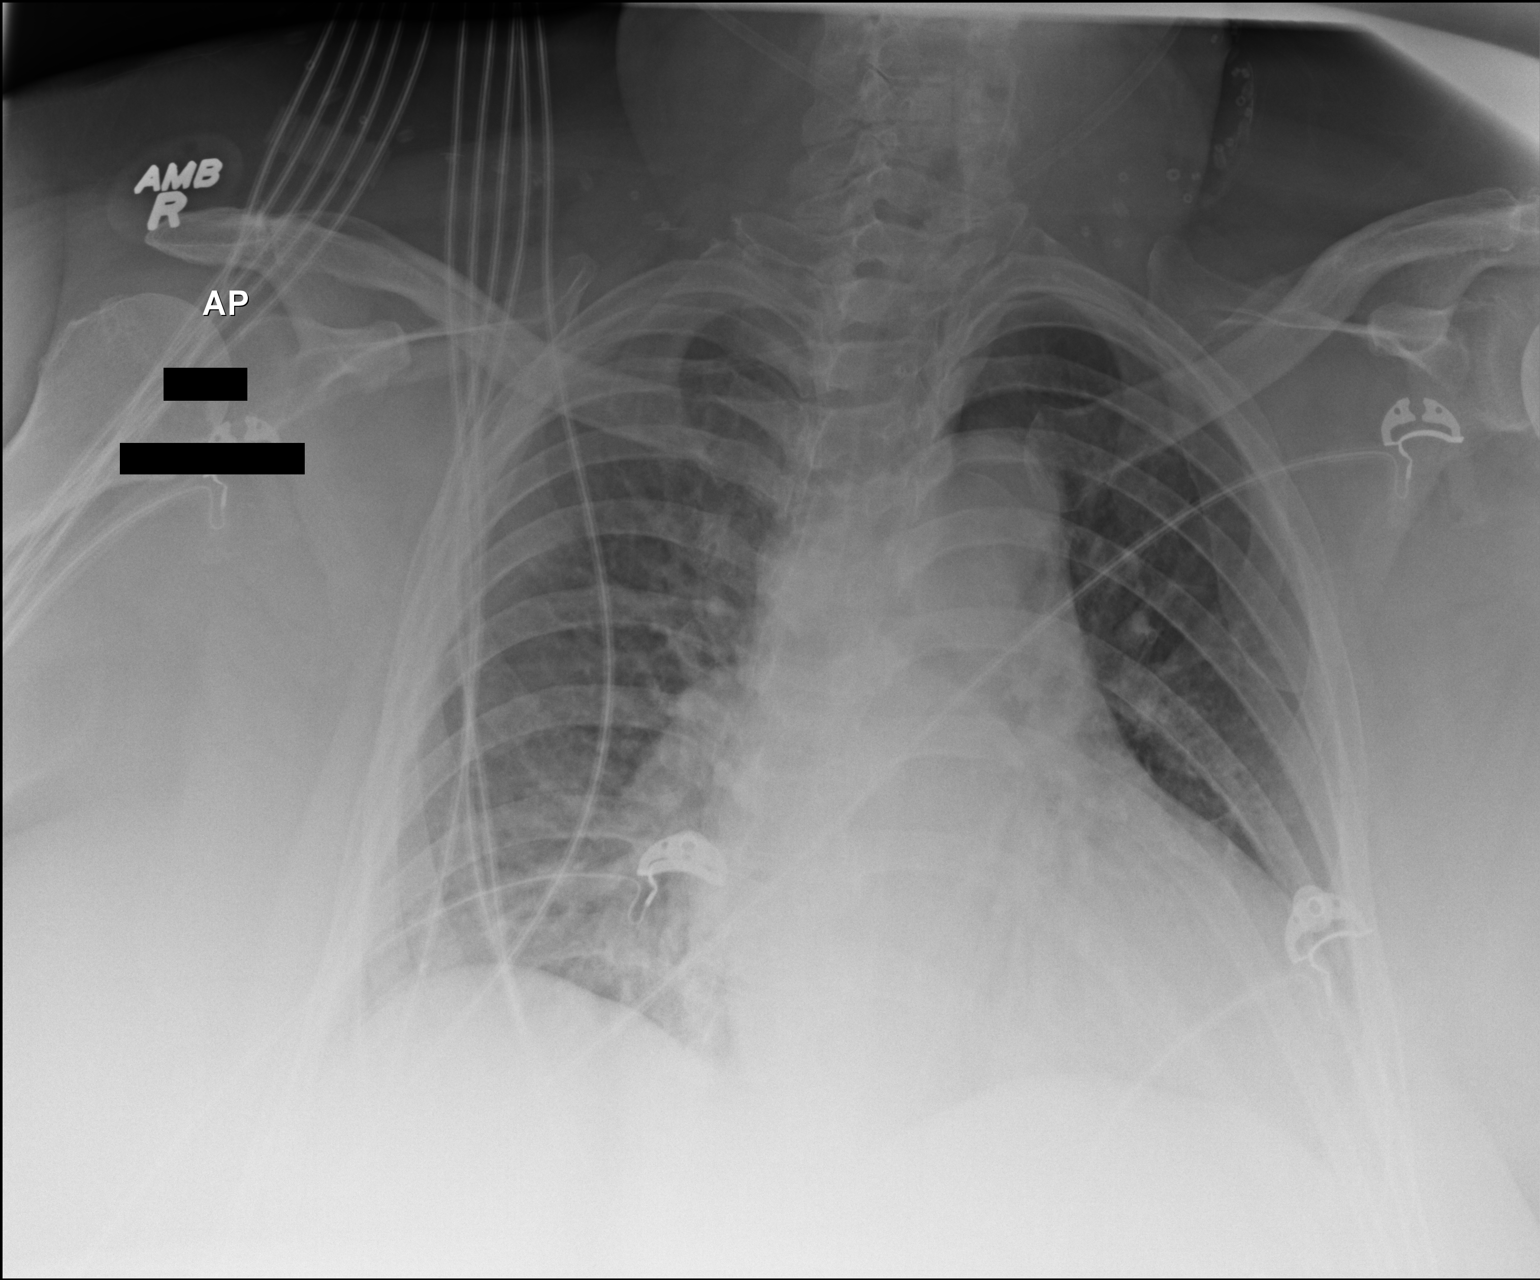

[1 of 1 positions shown; findings below may reference images not displayed]

FINDINGS: Cardiomegaly.  Slight interstitial prominence could
reflect early interstitial edema, stable since prior study.  No
confluent opacities or effusions.  No acute bony abnormality.
IMPRESSION: Stable slight interstitial prominence and cardiomegaly.

## 2013-07-05 ENCOUNTER — Encounter: Payer: Self-pay | Admitting: Internal Medicine

## 2013-07-05 ENCOUNTER — Non-Acute Institutional Stay (SKILLED_NURSING_FACILITY): Payer: Medicare Other | Admitting: Internal Medicine

## 2013-07-05 DIAGNOSIS — E119 Type 2 diabetes mellitus without complications: Secondary | ICD-10-CM

## 2013-07-05 DIAGNOSIS — F29 Unspecified psychosis not due to a substance or known physiological condition: Secondary | ICD-10-CM | POA: Insufficient documentation

## 2013-07-05 DIAGNOSIS — I4891 Unspecified atrial fibrillation: Secondary | ICD-10-CM

## 2013-07-05 DIAGNOSIS — G4733 Obstructive sleep apnea (adult) (pediatric): Secondary | ICD-10-CM

## 2013-07-05 DIAGNOSIS — I10 Essential (primary) hypertension: Secondary | ICD-10-CM

## 2013-07-05 DIAGNOSIS — I219 Acute myocardial infarction, unspecified: Secondary | ICD-10-CM

## 2013-07-05 DIAGNOSIS — K219 Gastro-esophageal reflux disease without esophagitis: Secondary | ICD-10-CM

## 2013-07-05 DIAGNOSIS — J9610 Chronic respiratory failure, unspecified whether with hypoxia or hypercapnia: Secondary | ICD-10-CM

## 2013-07-05 DIAGNOSIS — F209 Schizophrenia, unspecified: Secondary | ICD-10-CM

## 2013-07-05 DIAGNOSIS — E669 Obesity, unspecified: Secondary | ICD-10-CM

## 2013-07-05 DIAGNOSIS — F70 Mild intellectual disabilities: Secondary | ICD-10-CM

## 2013-07-05 DIAGNOSIS — E039 Hypothyroidism, unspecified: Secondary | ICD-10-CM

## 2013-07-05 DIAGNOSIS — J9612 Chronic respiratory failure with hypercapnia: Secondary | ICD-10-CM

## 2013-07-05 NOTE — Assessment & Plan Note (Signed)
Contributes to the schizophrenia, psychosis and non compliance with meds

## 2013-07-05 NOTE — Assessment & Plan Note (Signed)
Pt d/c on a new regimen: chlorpromazine, depakote, lamictal, lithium, zyprexa; obviously will continue all

## 2013-07-05 NOTE — Assessment & Plan Note (Signed)
Pt is obese so she probably does have OSA but in 4 years of epic there was one notation about sleep-one day, one tinme- with no info on it

## 2013-07-05 NOTE — Assessment & Plan Note (Signed)
Sec to COPD- bicarb, 34 on 12/25 improves withVentolin and pulmocort;07/04/13/bicarb -28

## 2013-07-05 NOTE — Assessment & Plan Note (Signed)
On synthroid 150 mcg;  12/5 TSH -0.970; continue current dose

## 2013-07-05 NOTE — Assessment & Plan Note (Signed)
Will be on proper diet here

## 2013-07-05 NOTE — Assessment & Plan Note (Signed)
Reported to be- on no meds;hbA1c a year ago was 5.4

## 2013-07-05 NOTE — Progress Notes (Signed)
MRN: [id number] Name: Jennifer SnipesBetty T [name]  Sex: female Age: 63 y.o. DOB: February 27, 1951  [facility] Facility[facility]: 111A Level Of Care: SNF Provider: Merrilee SeashoreALEXANDER, Tashea Othman D Emergency Contacts: Extended Emergency Contact Information Primary Emergency Contact: Maxwell,Clarance Address: [address]          [place], [place] [id number] [place] Mobile Phone: [phone] Relation: Friend Secondary Emergency Contact: Virl SonSummers,Roxy          [place], [place] Mobile Phone: [phone] Relation: Friend  Code Status:   Allergies: Food; Penicillins; and Sulfamethoxazole-trimethoprim  Chief Complaint  Patient presents with  . nursing home admission    HPI: Patient is 63 y.o. female who is schizophrenic with psychosis who just spent 12/4-1/15 in involuntary commitment who is reported stable on new meds and changed dosages and ready for d/c from psych and admission to SNF.  Past Medical History  Diagnosis Date  . Schizoaffective disorder   . Urinary tract infection   . Hypertension   . GERD (gastroesophageal reflux disease)   . Hypothyroidism   . Obesity   . Asthma   . COPD (chronic obstructive pulmonary disease)   . CHF (congestive heart failure)   . Seizures   . Morbid obesity   . Chronic pain   . Hypercholesteremia   . Anginal pain   . Myocardial infarction 2002  . Diabetes mellitus     "I'm not diabetic anymore" (05/23/2012)  . Anemia   . History of blood transfusion 1993    "when I had my hysterectomy" (05/23/2012)  . H/O hiatal hernia   . Daily headache   . Arthritis     "severe; all over my body" (05/23/2012)  . Hypoventilation syndrome     [name]/notes 05/30/2012  . Atrial fibrillation   . Atrial flutter, paroxysmal     [name]/notes 05/30/2012  . Exertional dyspnea   . Shortness of breath     "all the time lately" (05/30/2012)  . Pneumonia     "several times; including now" (04/23/2013)    Past Surgical History  Procedure  Laterality Date  . Tubal ligation  1998  . Cholecystectomy  ?2008  . Cardiac catheterization  2008  . Vaginal hysterectomy  1993      Medication List       This list is accurate as of: 07/05/13 11:59 PM.  Always use your most recent med list.               albuterol 108 (90 BASE) MCG/ACT inhaler  Commonly known as:  PROVENTIL HFA;VENTOLIN HFA  Inhale 2 puffs into the lungs every 6 (six) hours as needed for wheezing.     aspirin EC 81 MG tablet  Take 81 mg by mouth every morning.     budesonide 0.25 MG/2ML nebulizer solution  Commonly known as:  PULMICORT  Take 0.25 mg by nebulization 2 (two) times daily.     chlorproMAZINE 100 MG tablet  Commonly known as:  THORAZINE  Take 100 mg by mouth 2 times daily at 12 noon and 4 pm. AND 200 MG PO Q hs     divalproex 500 MG 24 hr tablet  Commonly known as:  DEPAKOTE ER  Take 1,000 mg by mouth at bedtime.     lamoTRIgine 100 MG tablet  Commonly known as:  LAMICTAL  Take 100 mg by mouth 2 (two) times daily.     levothyroxine 150 MCG tablet  Commonly known as:  SYNTHROID, LEVOTHROID  Take 150 mcg by mouth every morning. For thyroid disease.     lithium 600 MG capsule  Take 600 mg by mouth 2 (two) times daily with a meal.     nitroGLYCERIN 0.4 MG SL tablet  Commonly known as:  NITROSTAT  Place 0.4 mg under the tongue every 5 (five) minutes as needed for chest pain (may repeat for 2 doses).     OLANZapine 15 MG tablet  Commonly known as:  ZYPREXA  Take 30 mg by mouth at bedtime.     omeprazole 20 MG capsule  Commonly known as:  PRILOSEC  Take 20 mg by mouth every morning.     ondansetron 4 MG tablet  Commonly known as:  ZOFRAN  Take 4 mg by mouth every 8 (eight) hours as needed for nausea or vomiting.     polyethylene glycol packet  Commonly known as:  MIRALAX / GLYCOLAX  Take 17 g by mouth 2 (two) times a week. Tuesday and Friday        Meds ordered this encounter  Medications  . divalproex (DEPAKOTE ER) 500 MG  24 hr tablet    Sig: Take 1,000 mg by mouth at bedtime.  . chlorproMAZINE (THORAZINE) 100 MG tablet    Sig: Take 100 mg by mouth 2 times daily at 12 noon and 4 pm. AND 200 MG PO Q hs  . DISCONTD: enoxaparin (LOVENOX) 40 MG/0.4ML injection    Sig: Inject 40 mg into the skin daily.  Marland Kitchen OLANZapine (ZYPREXA) 15 MG tablet    Sig: Take 30 mg by mouth at bedtime.  Marland Kitchen lithium 600 MG capsule    Sig: Take 600 mg by mouth 2 (two) times daily with a meal.  . lamoTRIgine (LAMICTAL) 100 MG tablet    Sig: Take 100 mg by mouth 2 (two) times daily.  . ondansetron (ZOFRAN) 4 MG tablet    Sig: Take 4 mg by mouth every 8 (eight) hours as needed for nausea or vomiting.    Immunization History  Administered Date(s) Administered  . Influenza Split 03/29/2012  . Influenza Whole 05/27/2009, 02/18/2010  . Influenza,inj,Quad PF,36+ Mos 04/24/2013  . Pneumococcal Polysaccharide-23 05/20/2009, 03/29/2012  . Td 05/27/2009    History  Substance Use Topics  . Smoking status: Former Smoker -- 2.00 packs/day for 12 years    Types: Cigarettes    Quit date: 06/29/2000  . Smokeless tobacco: Current User    Types: Chew  . Alcohol Use: No    Family history is noncontributory    Review of Systems  DATA OBTAINED: from patient GENERAL: Feels well no fevers, fatigue, appetite changes SKIN: No itching, rash or wounds EYES: No eye pain, redness, discharge EARS: No earache, tinnitus, change in hearing NOSE: No congestion, drainage or bleeding  MOUTH/THROAT: No mouth or tooth pain, No sore throat, No difficulty chewing or swallowing  RESPIRATORY: No cough, wheezing, SOB CARDIAC: No chest pain, palpitations, lower extremity edema  GI: No abdominal pain, No N/V/D or constipation, No heartburn or reflux  GU: No dysuria, frequency or urgency, or incontinence  MUSCULOSKELETAL: c/o L leg pain;denies trauma NEUROLOGIC: No headache, dizziness or focal weakness PSYCHIATRIC: No overt anxiety or sadness.    Filed Vitals:    07/05/13 1018  BP: 139/82  Pulse: 85  Temp: 97.7 F (36.5 C)  Resp: 18    Physical Exam  GENERAL APPEARANCE: Alert, conversant. Appropriately groomed. No acute distress; obese SKIN: No diaphoresis rash, HEAD: Normocephalic, atraumatic  EYES: Conjunctiva/lids clear. Pupils round,  reactive. EOMs intact.  EARS: External exam WNL, canals clear. Hearing grossly normal.  NOSE: No deformity or discharge.  MOUTH/THROAT: Lips w/o lesions.  RESPIRATORY: Breathing is even, unlabored. Lung sounds are clear   CARDIOVASCULAR: Heart RRR no murmurs, rubs or gallops. No peripheral edema.   GASTROINTESTINAL: Abdomen is soft, non-tender, not distended w/ normal bowel sounds.  GENITOURINARY: Bladder non tender, not distended  MUSCULOSKELETAL: No abnormal joints or musculature; L leg is normal NEUROLOGIC: Oriented X3. Cranial nerves 2-12 grossly intact. Moves all extremities no tremor. PSYCHIATRIC: Manipulative   Patient Active Problem List   Diagnosis Date Noted  . Schizophrenia 07/05/2013  . Psychosis 07/05/2013  . Urinary tract infection, site not specified 04/29/2013  . Schizo-affective psychosis 04/19/2013  . COPD (chronic obstructive pulmonary disease) 04/19/2013  . Dyspnea and respiratory abnormality 04/19/2013  . Constipation 12/11/2012  . Hypercapnic respiratory failure, chronic 09/02/2012  . Altered mental status 07/29/2012  . Tachycardia 03/30/2012  . Hypertension 03/28/2012  . Atrial fibrillation 03/28/2012  . Hypothyroidism 03/28/2012  . Diabetes mellitus 03/28/2012  . Hypotension 03/28/2012  . Acute respiratory failure with hypoxia 03/28/2012  . Morbid obesity   . Chronic pain   . Nausea 01/26/2012  . Diarrhea 01/26/2012  . Chest pain 01/11/2012  . Hyponatremia 01/11/2012  . Healthcare-associated pneumonia 01/11/2012  . Community acquired pneumonia 07/29/2011  . Suicidal ideations 07/29/2011  . Schizoaffective disorder, bipolar type 06/20/2011  . ANEMIA 07/13/2010  .  CHEST PAIN 07/13/2010  . OBSTRUCTIVE SLEEP APNEA 01/22/2010  . CHOLELITHIASIS 11/30/2009  . MYOCARDIAL INFARCTION 11/17/2009  . ASTHMA 11/17/2009  . INSOMNIA 11/17/2009  . DEGENERATIVE JOINT DISEASE, KNEES, BILATERAL 05/27/2009  . URINALYSIS, ABNORMAL 05/27/2009  . OBESITY 03/26/2009  . DYSPNEA ON EXERTION 08/12/2008  . LOW BACK PAIN SYNDROME 12/11/2007  . HYPOTHYROIDISM NOS 02/06/2007  . DEFICIENCY, B-COMPLEX NEC 02/06/2007  . RETARDATION, MENTAL, MILD 02/06/2007  . HYPERTENSION, BENIGN ESSENTIAL 02/06/2007  . GERD 02/06/2007  . CHEST PAIN, HX OF 02/21/2006  . TARDIVE DYSKINESIA 08/10/2004    CBC    Component Value Date/Time   WBC 7.4 05/20/2013 1840   RBC 3.61* 05/20/2013 1840   RBC 2.70* 05/19/2011 0632   HGB 11.9* 05/20/2013 1840   HCT 36.2 05/20/2013 1840   PLT 236 05/20/2013 1840   MCV 100.3* 05/20/2013 1840   LYMPHSABS 2.2 04/24/2013 0805   MONOABS 0.6 04/24/2013 0805   EOSABS 0.1 04/24/2013 0805   BASOSABS 0.0 04/24/2013 0805    CMP     Component Value Date/Time   NA 134* 05/20/2013 1840   K 3.9 05/20/2013 1840   CL 98 05/20/2013 1840   CO2 28 05/20/2013 1840   GLUCOSE 102* 05/20/2013 1840   BUN 7 05/20/2013 1840   CREATININE 0.56 05/20/2013 1840   CALCIUM 10.2 05/20/2013 1840   PROT 7.4 05/20/2013 1840   ALBUMIN 3.3* 05/20/2013 1840   AST 11 05/20/2013 1840   ALT 6 05/20/2013 1840   ALKPHOS 71 05/20/2013 1840   BILITOT 0.2* 05/20/2013 1840   GFRNONAA >90 05/20/2013 1840   GFRAA >90 05/20/2013 1840    Assessment and Plan  Schizophrenia Pt d/c on a new regimen: chlorpromazine, depakote, lamictal, lithium, zyprexa; obviously will continue all  Psychosis See under schizophrenia  RETARDATION, MENTAL, MILD Contributes to the schizophrenia, psychosis and non compliance with meds  OBESITY Will be on proper diet here  HYPOTHYROIDISM NOS On synthroid 150 mcg;  12/5 TSH -0.970; continue current dose  Diabetes mellitus Reported to be- on no meds;hbA1c  a year ago was  5.4  GERD Omeprazole 40 mg daily-will continue  Hypercapnic respiratory failure, chronic Sec to COPD- bicarb, 34 on 12/25 improves withVentolin and pulmocort;07/04/13/bicarb -28  HYPERTENSION, BENIGN ESSENTIAL Metoprolol;controlled today on no meds ;continue observation was d/c this hospitalization  MYOCARDIAL INFARCTION I can find no record of this in epic back to 2010 with an ECHO 2007 with nl LV function and EF 65%; she has been to ED maybe 30 times for CP but never ruled in  Atrial fibrillation I can find no record of this in epic in at least 30 ED visits over last 4 years  OBSTRUCTIVE SLEEP APNEA Pt is obese so she probably does have OSA but in 4 years of epic there was one notation about sleep-one day, one tinme- with no info on it    Margit Hanks, MD

## 2013-07-05 NOTE — Assessment & Plan Note (Signed)
See under schizophrenia

## 2013-07-05 NOTE — Assessment & Plan Note (Signed)
I can find no record of this in epic back to 2010 with an ECHO 2007 with nl LV function and EF 65%; she has been to ED maybe 30 times for CP but never ruled in

## 2013-07-05 NOTE — Assessment & Plan Note (Signed)
I can find no record of this in epic in at least 30 ED visits over last 4 years

## 2013-07-05 NOTE — Assessment & Plan Note (Signed)
Omeprazole 40 mg daily-will continue

## 2013-07-05 NOTE — Assessment & Plan Note (Signed)
Metoprolol;controlled today on no meds ;continue observation was d/c this hospitalization

## 2013-07-19 IMAGING — CR DG CHEST 2V
2 series · 2 of 2 positions shown · non-contrast
Comparison: 02/29/2012

CLINICAL DATA: Shortness of breath, chest pain.

CHEST - 2 VIEW

[w chest lat]
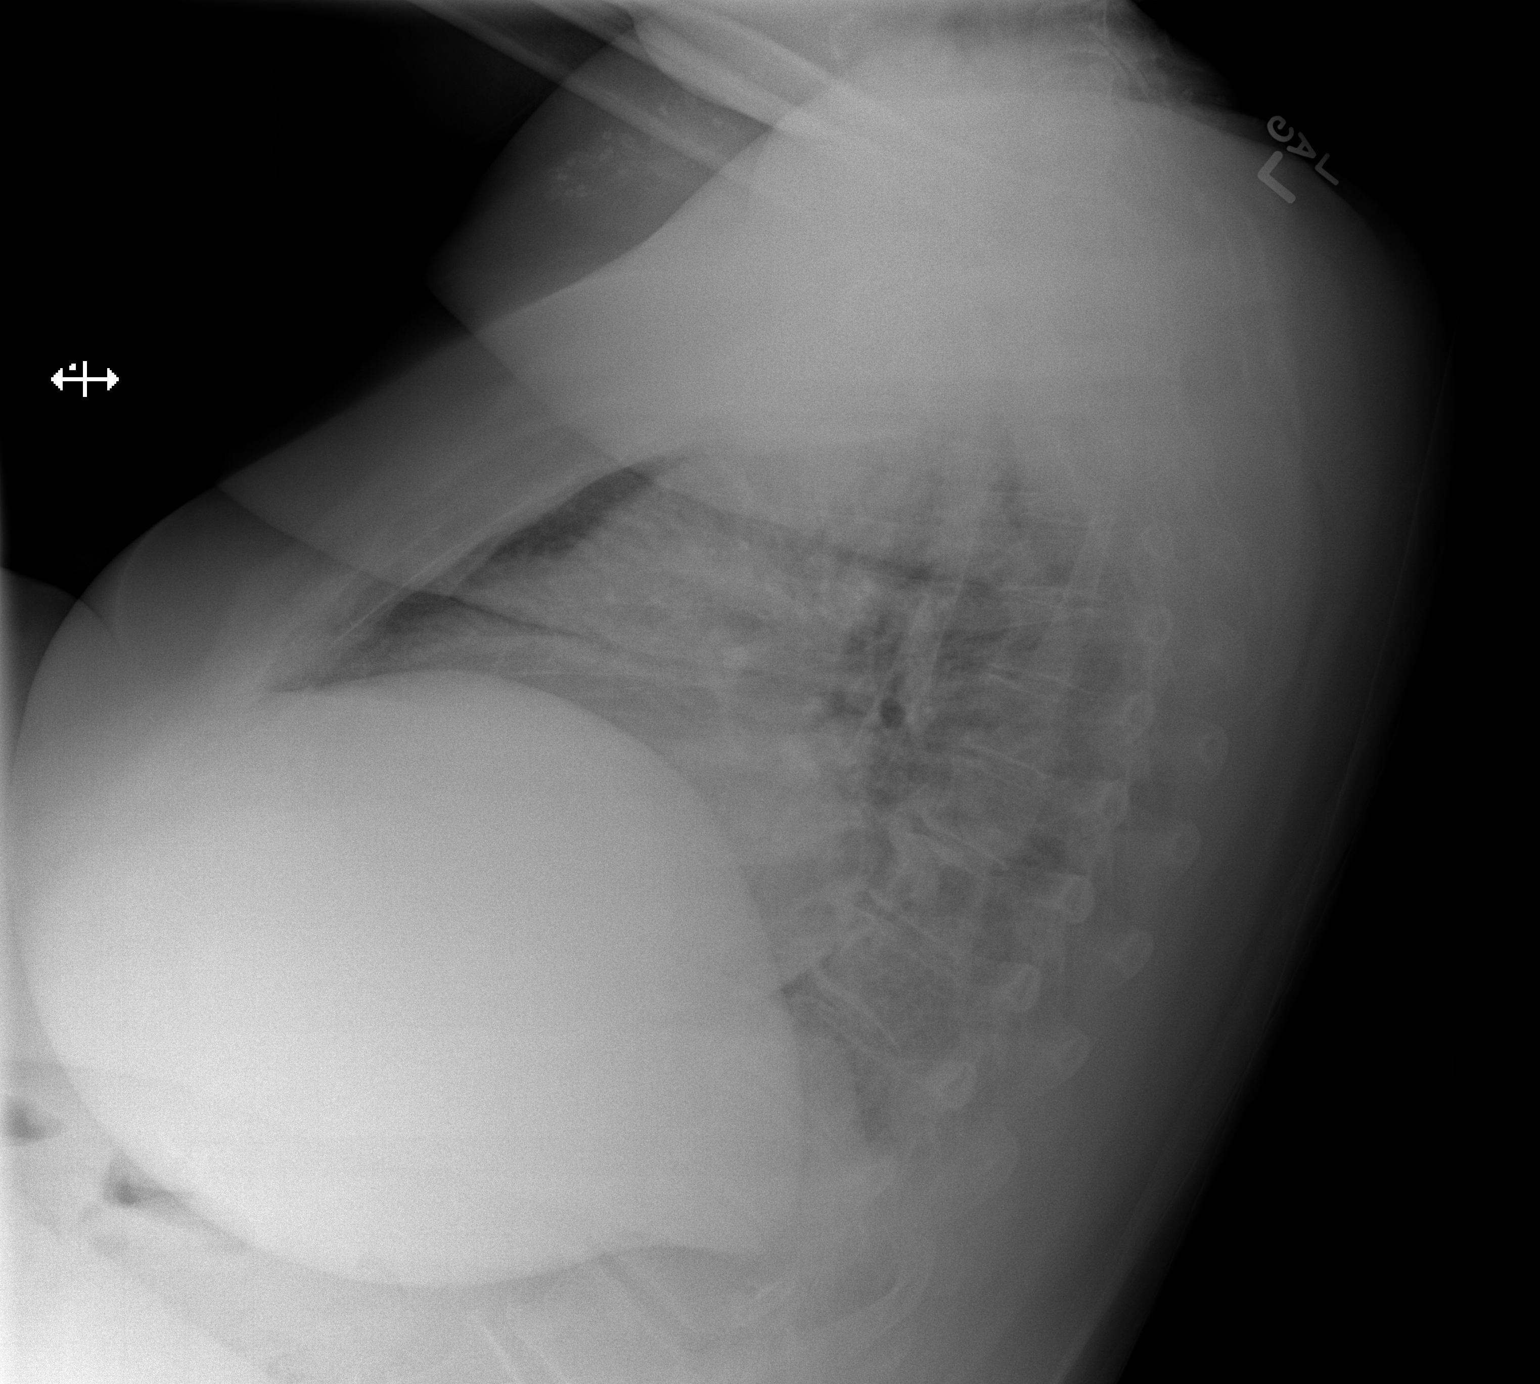

[x chest ap]
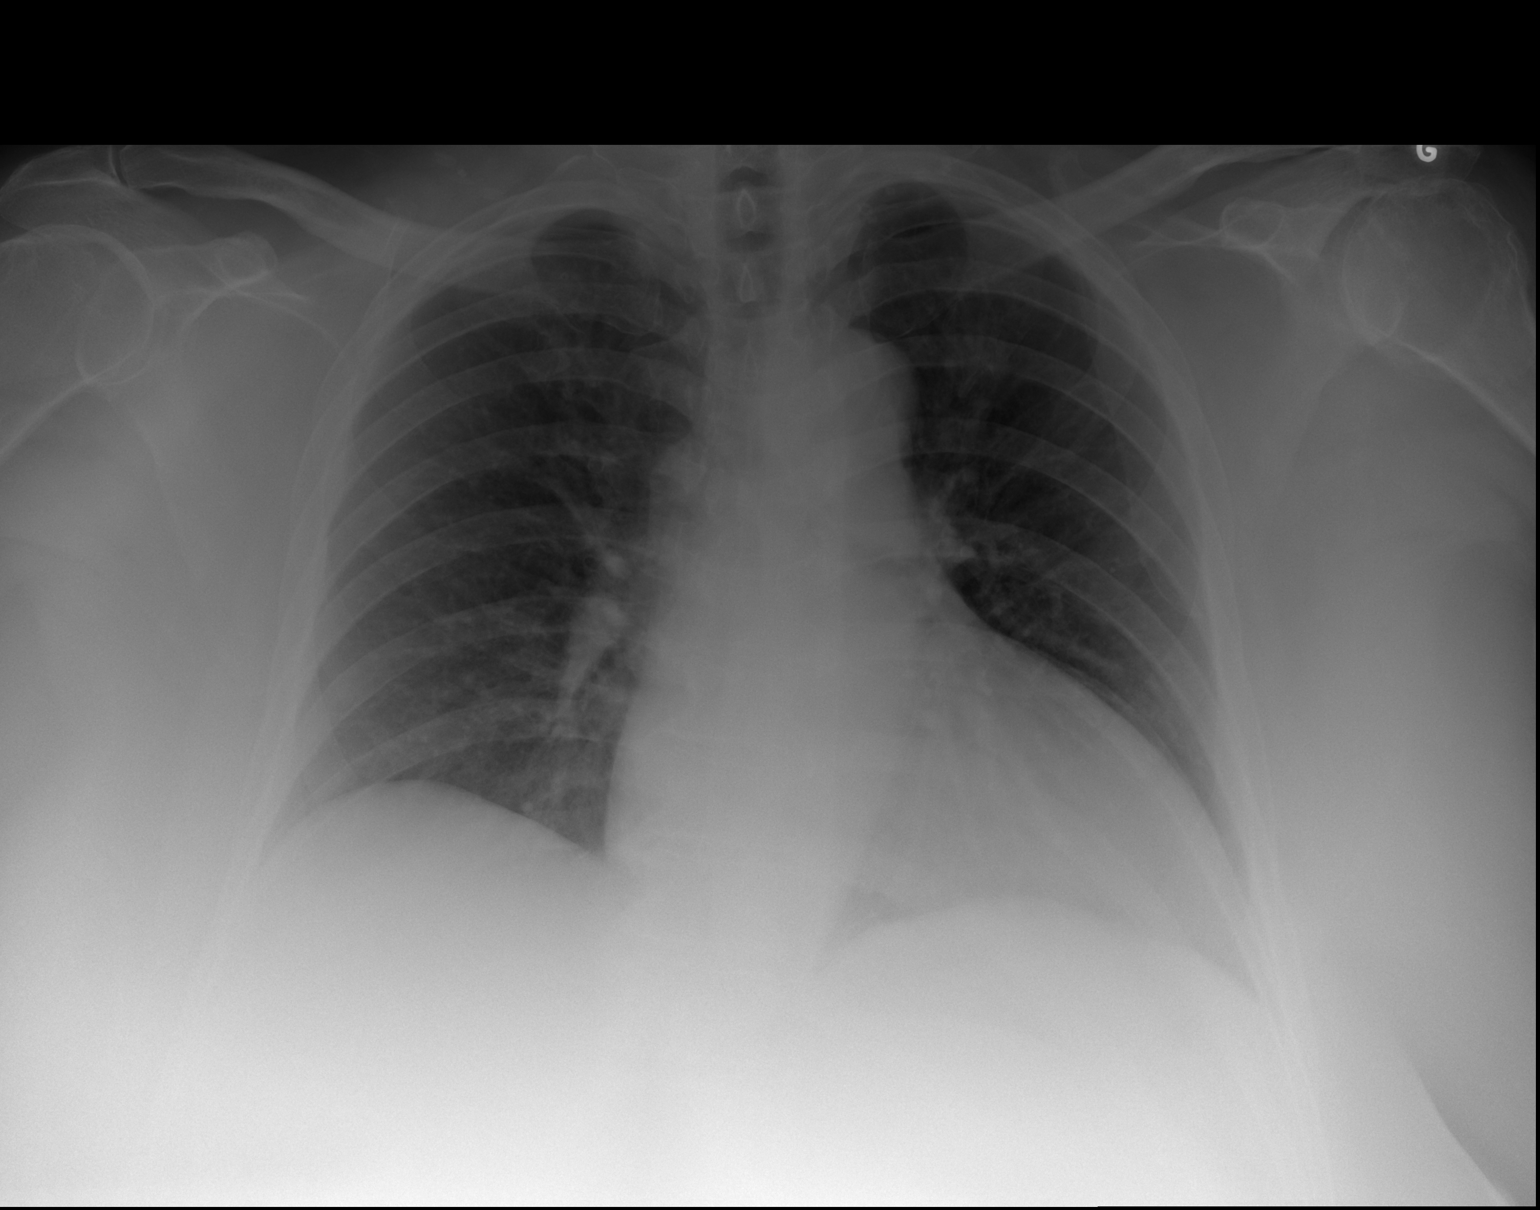

[2 of 2 positions shown; findings below may reference images not displayed]

FINDINGS: Heart size mildly enlarged.  Mild central vascular
congestion.  No overt edema.  No focal consolidation, pleural
effusion, or pneumothorax.  Multilevel degenerative change.  No
acute osseous finding.
IMPRESSION: Heart size mildly enlarged.  No overt edema or focal consolidation.

## 2013-07-25 IMAGING — CR DG CHEST 2V
1 series · 1 of 1 positions shown · non-contrast
Comparison: 03/21/2012; 02/14/2012

CLINICAL DATA: Abdominal pain, shortness of breath

CHEST - 2 VIEW

[view not recorded]
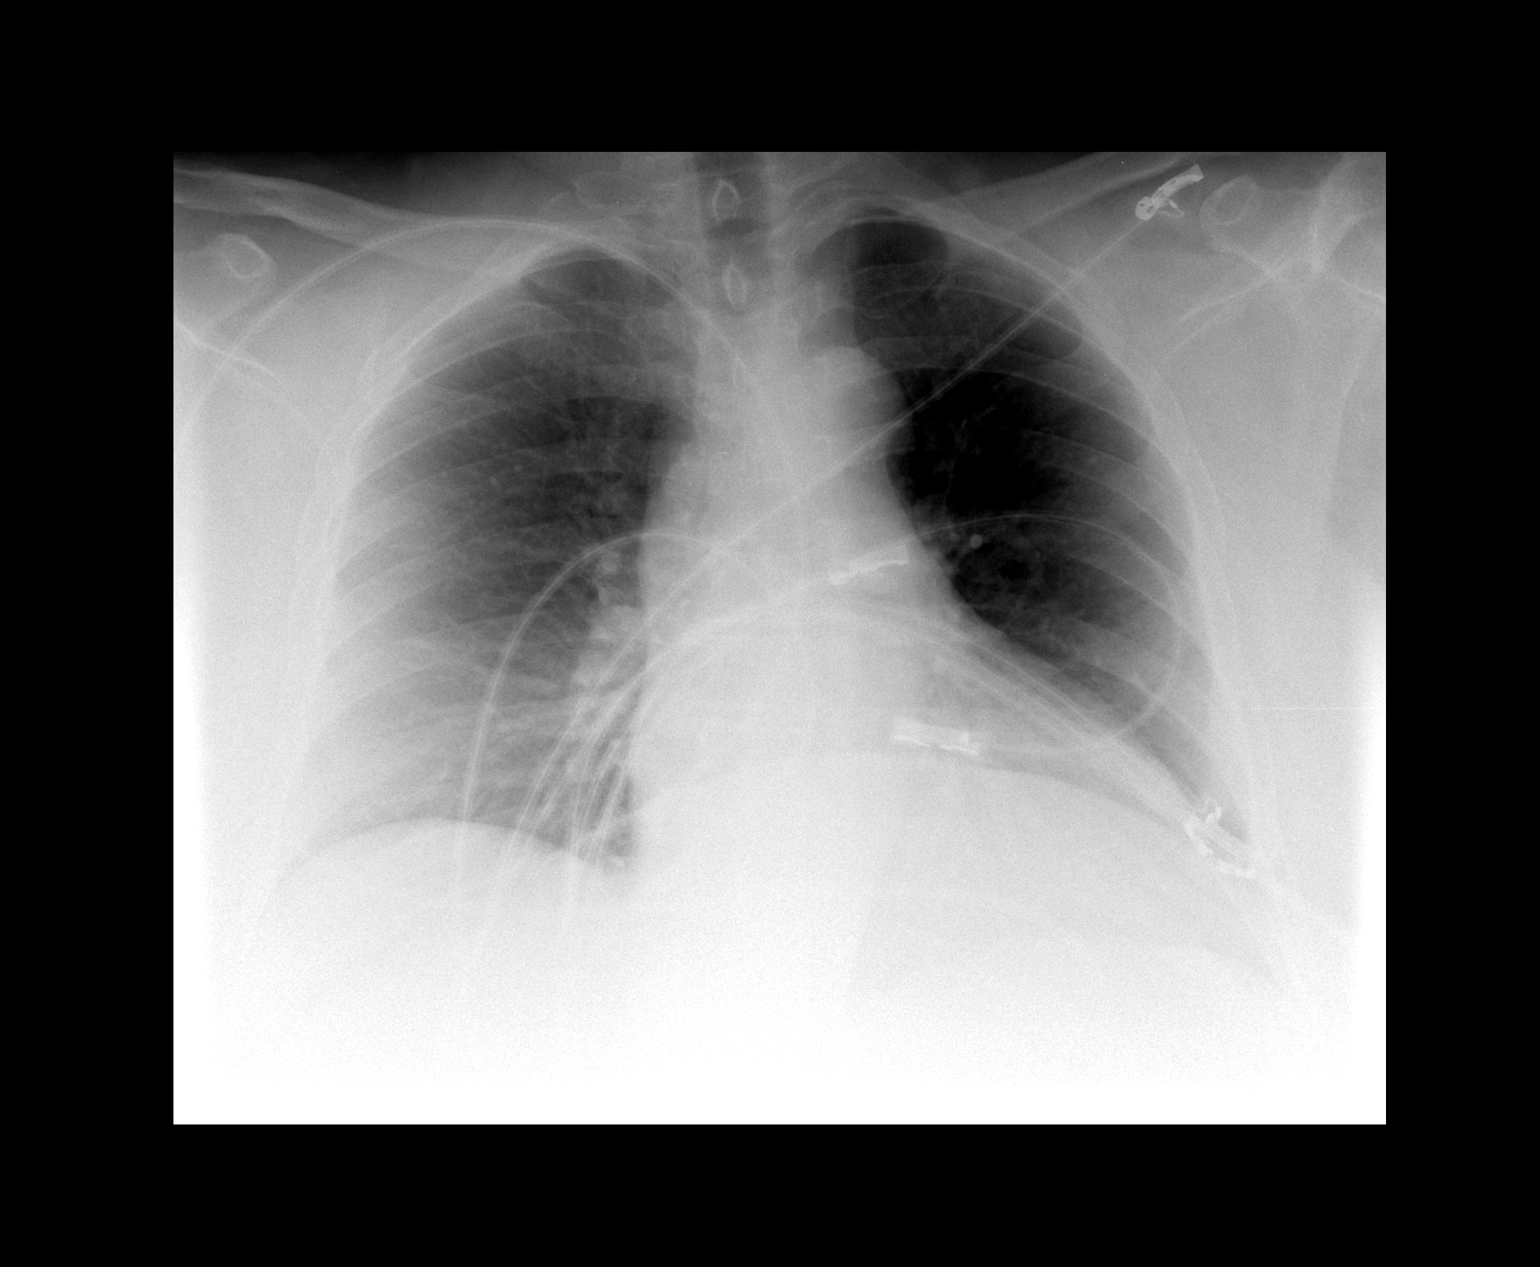

[1 of 1 positions shown; findings below may reference images not displayed]

FINDINGS: Grossly unchanged enlarged cardiac silhouette and mediastinal
contours.  Lung volumes remain reduced with grossly unchanged
bibasilar heterogeneous opacities.  No definite new focal airspace
opacities.  No definite pleural effusion or pneumothorax.
Unchanged bones.
IMPRESSION: No acute cardiopulmonary disease.

## 2013-07-26 IMAGING — CT CT ANGIO CHEST
1 of 2 series · 2 of 7 positions shown · IV contrast (APPLIED)
Comparison: Chest CTA 01/02/2012.

CLINICAL DATA: 60-year-old female with pain radiating to the
epigastric area.

CT ANGIOGRAPHY CHEST
TECHNIQUE: Multidetector CT imaging of the chest using the
standard protocol during bolus administration of intravenous
contrast. Multiplanar reconstructed images including MIPs were
obtained and reviewed to evaluate the vascular anatomy.
Contrast: 100mL OMNIPAQUE IOHEXOL 350 MG/ML SOLN

[Series 602: dyneva (id) · axial · 0.70mm/px · z∈[-206,-206]mm · 2 of 4 slices shown]
[im 2/4  lung]
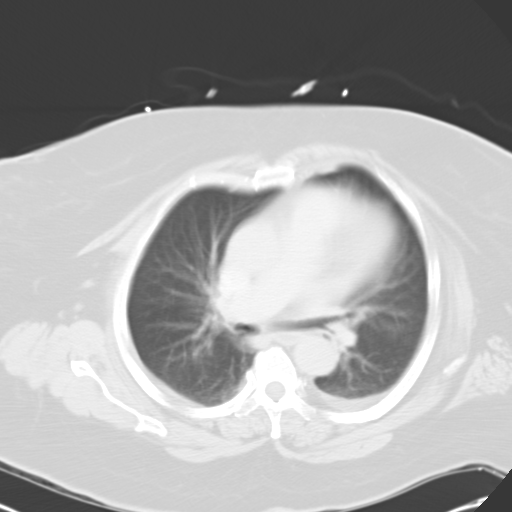
[im 3/4  soft-tissue]
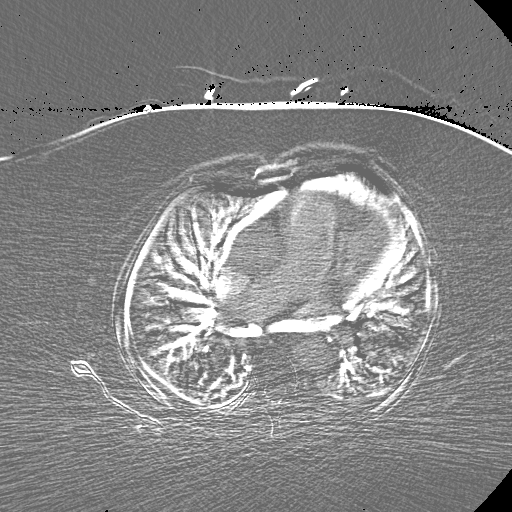

[2 of 7 positions shown; findings below may reference images not displayed]

FINDINGS: Suboptimal contrast bolus timing in the pulmonary
arterial tree.  Superimposed respiratory motion artifact.
Subsequently, no right pulmonary artery or right hilar, lower lobe
pulmonary artery filling defect.  The left pulmonary artery
branches are less well evaluated.  The left lower lobe branches
appear clear.

Atelectatic changes to the major airway is similar to the prior
study.  Cardiomegaly.  No pericardial or pleural effusion.  Mild
dependent atelectasis.  No consolidation.  No other acute pulmonary
opacity.

Negative visualized upper abdominal viscera.  Negative thoracic
inlet.  No mediastinal lymphadenopathy.

Degenerative changes in the spine. No acute osseous abnormality
identified.
IMPRESSION: 1.  Suboptimal contrast bolus timing, particularly in the left
pulmonary arterial tree.  Doubt left sided pulmonary embolus and no
evidence of acute PE on the right.  If clinical suspicion persists,
Nuclear Medicine VQ scan recommended.
2.  Cardiomegaly.  Pulmonary atelectasis.

## 2013-07-27 IMAGING — CR DG CHEST 1V PORT
1 series · 1 of 1 positions shown · non-contrast
Comparison: 03/27/2012

CLINICAL DATA: Short of breath.  Cough and wheezing

PORTABLE CHEST - 1 VIEW

[AP]
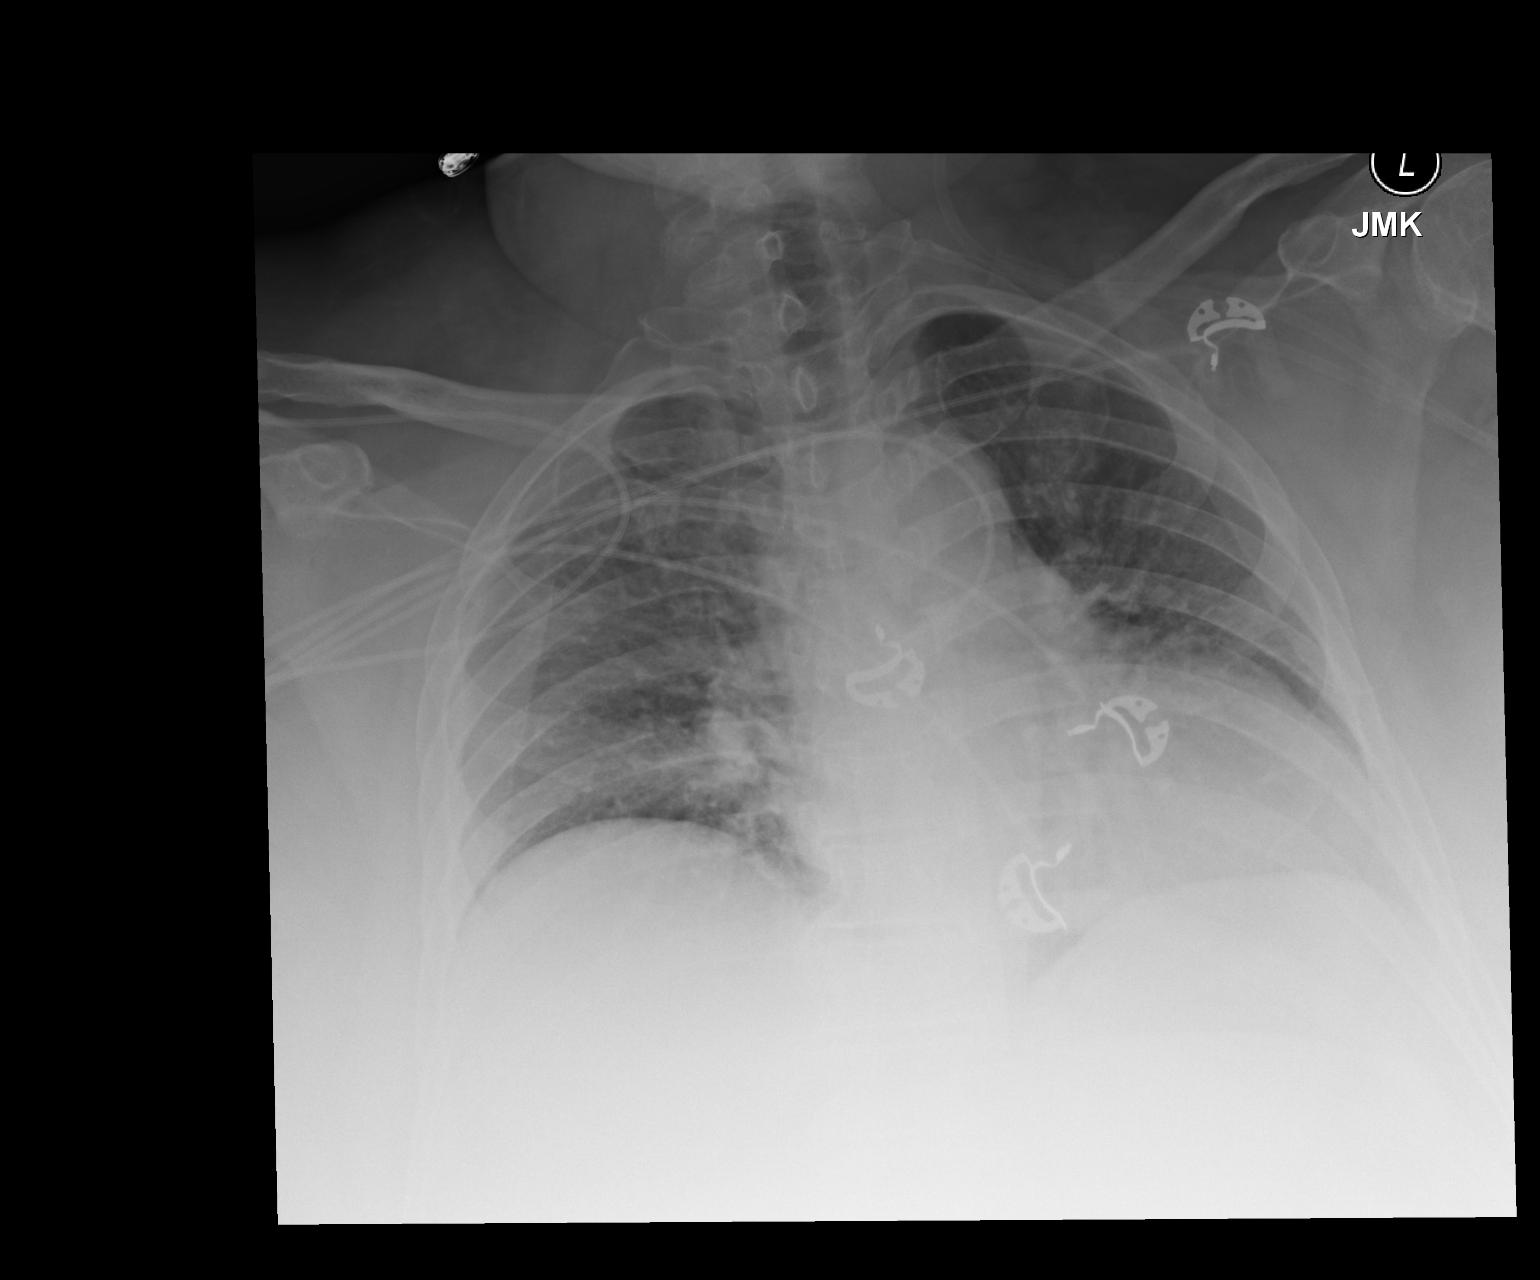

[1 of 1 positions shown; findings below may reference images not displayed]

FINDINGS: Decreased lung volume with bibasilar atelectasis compared
with the prior study.  Negative for heart failure or edema.  No
pleural effusion.
IMPRESSION: Hypoventilation with bibasilar atelectasis.

## 2013-07-28 IMAGING — US US RENAL
1 series · 14 of 21 positions shown · non-contrast
Comparison: Ultrasound 12/26/2009.  CT 06/20/2010..

CLINICAL DATA: History of AWET.  History of diabetes and
hypertension.

RENAL/URINARY TRACT ULTRASOUND COMPLETE

[Series 1: us renal · 0.31mm/px · 14 of 21 slices shown]
[im 1/21]
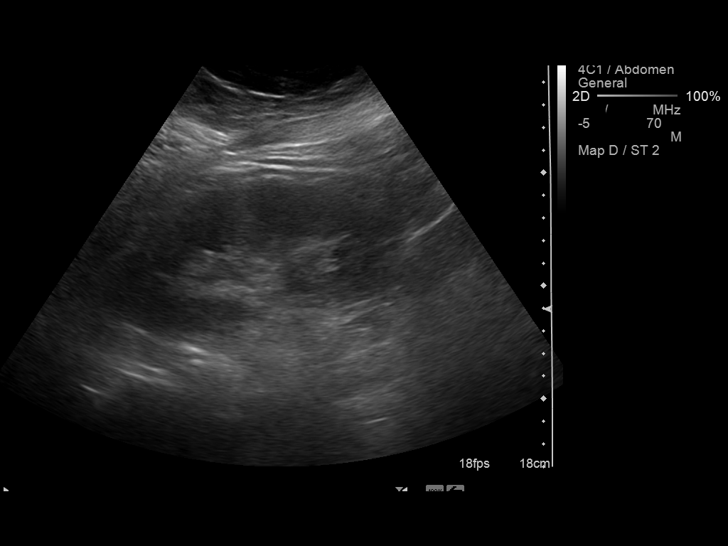
[im 3/21]
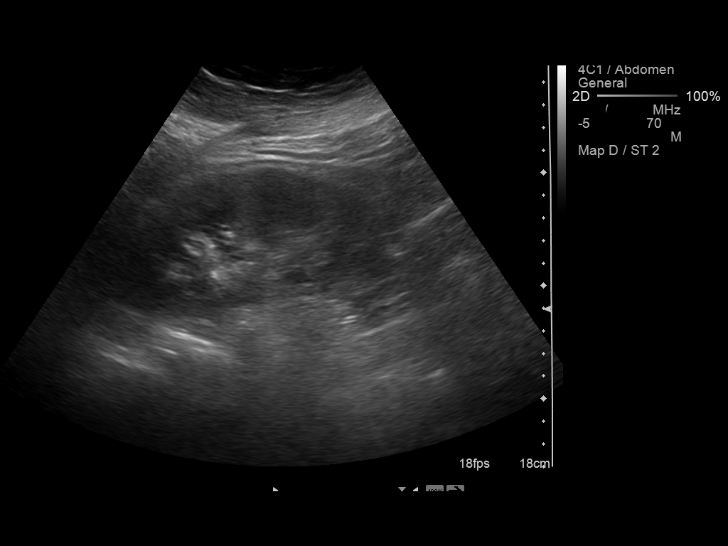
[im 4/21]
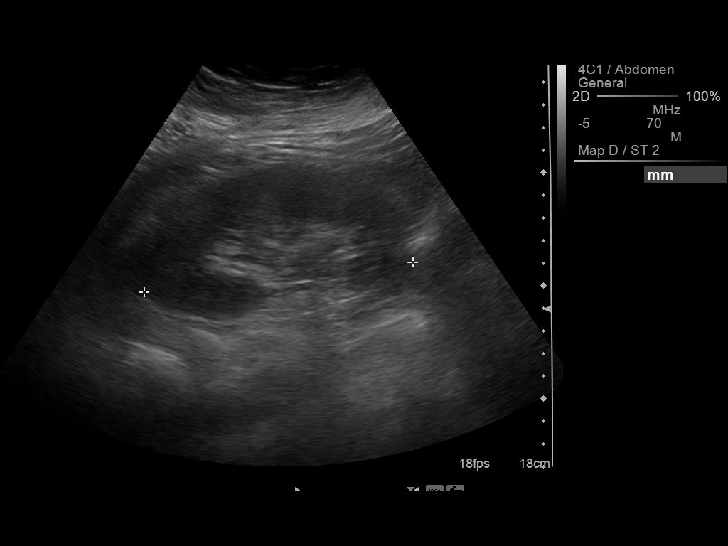
[im 6/21]
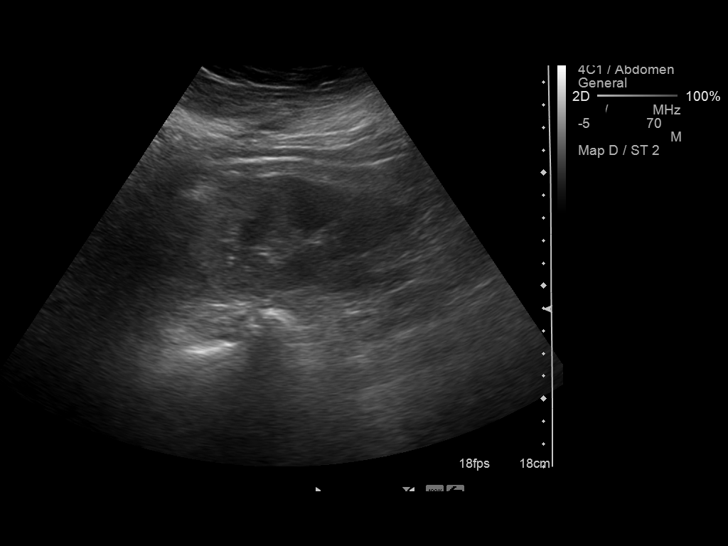
[im 7/21]
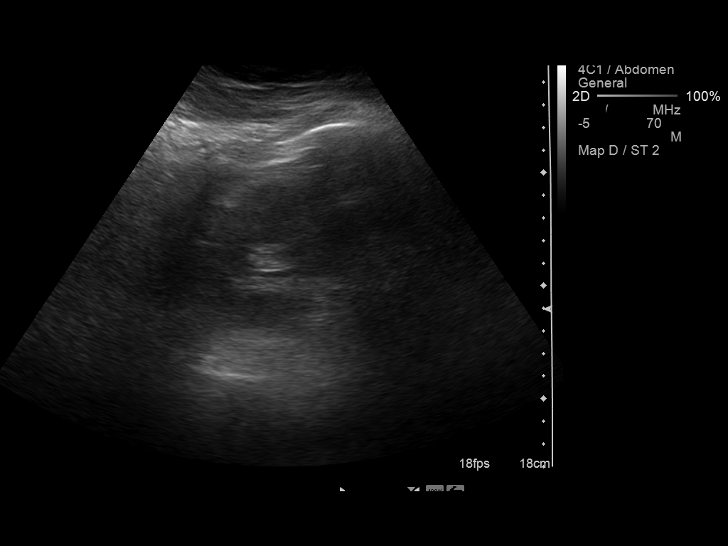
[im 9/21]
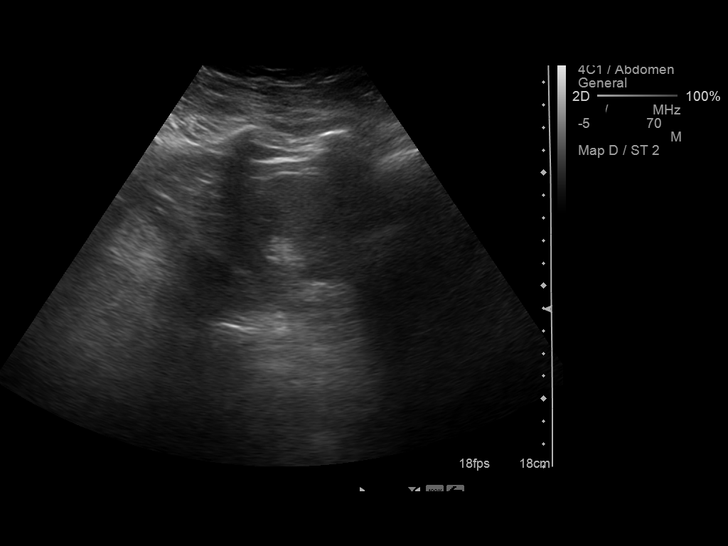
[im 10/21]
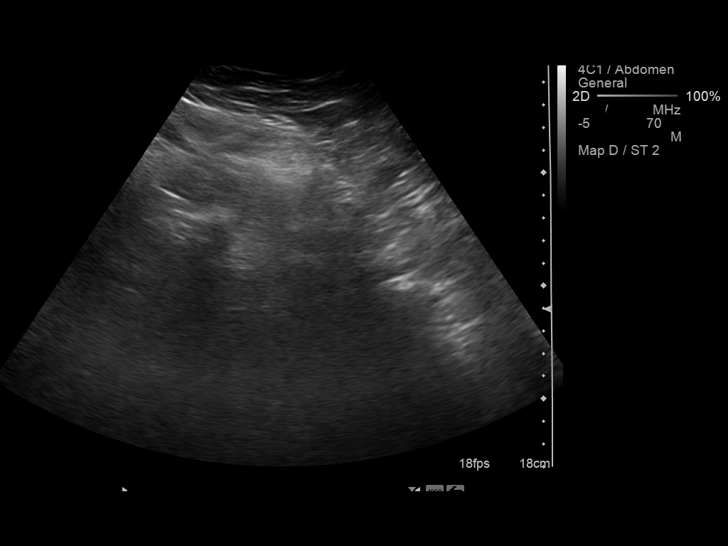
[im 12/21]
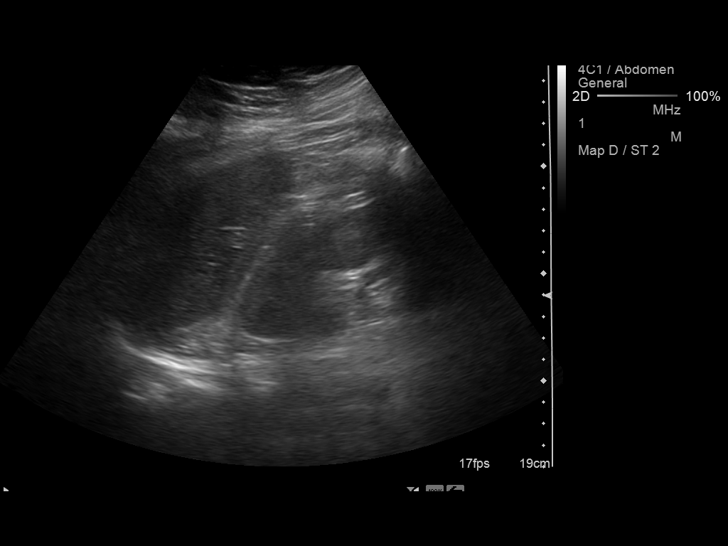
[im 13/21]
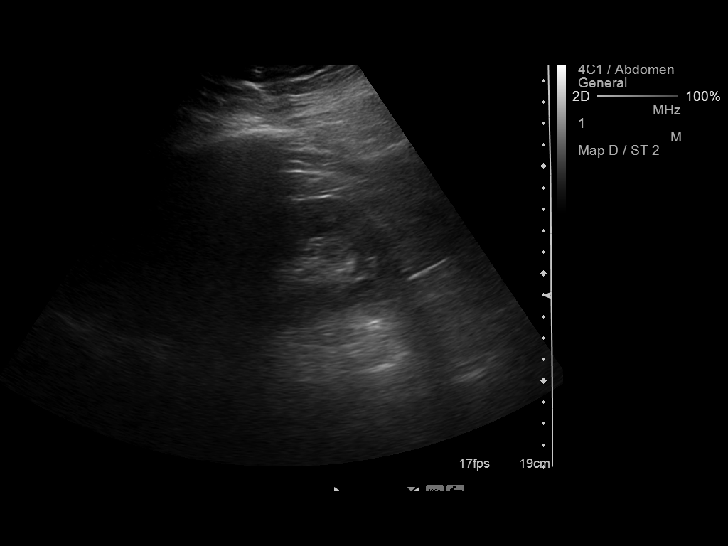
[im 15/21]
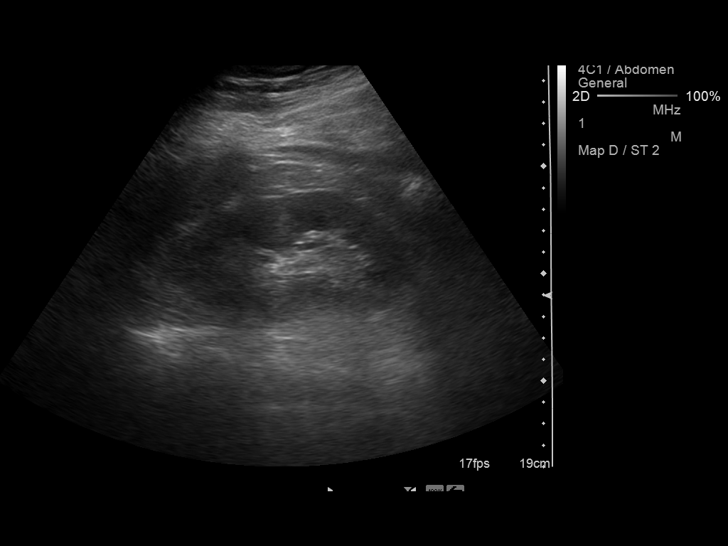
[im 16/21]
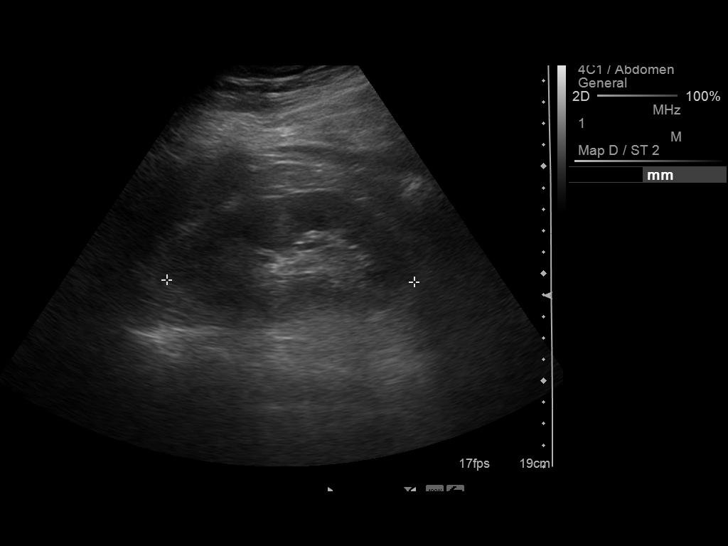
[im 18/21]
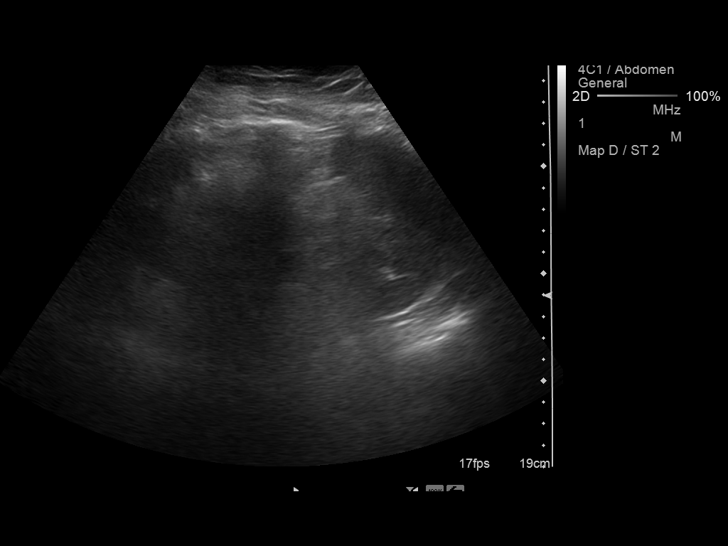
[im 19/21]
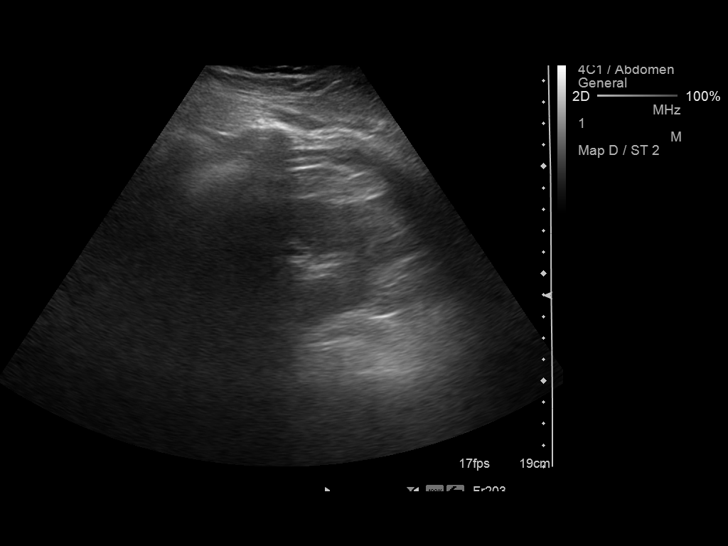
[im 21/21]
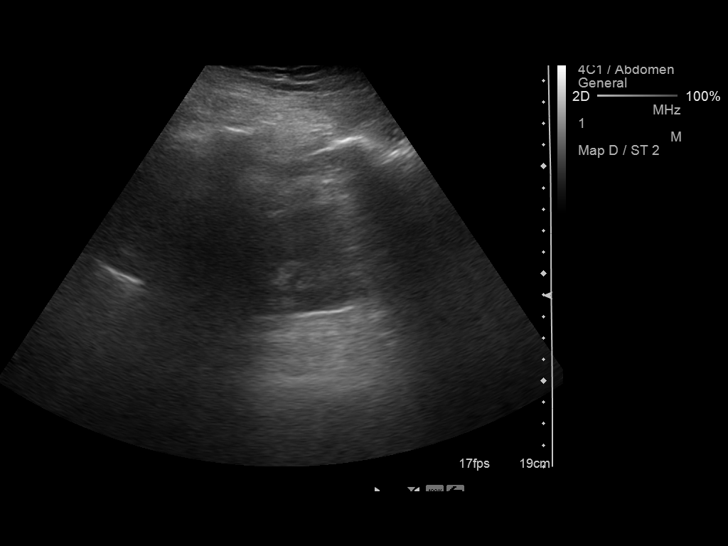

[14 of 21 positions shown; findings below may reference images not displayed]

FINDINGS: Right Kidney:  Right renal length is 12.0 cm.

Left Kidney:  Left renal length is 11.5 cm.

Examination of each kidney shows no evidence of hydronephrosis,
solid or cystic mass, calculus, parenchymal loss, or parenchymal
textural abnormality.

Bladder:  Bladder is decompressed by Foley catheter and is not
visualized.
IMPRESSION: No renal lesions are identified.

## 2013-07-28 IMAGING — CR DG CHEST 1V PORT
1 series · 1 of 1 positions shown · non-contrast
Comparison: 03/29/2012.

CLINICAL DATA: Shortness of breath..  Aspiration.  Wheezing.

PORTABLE CHEST - 1 VIEW

[AP]
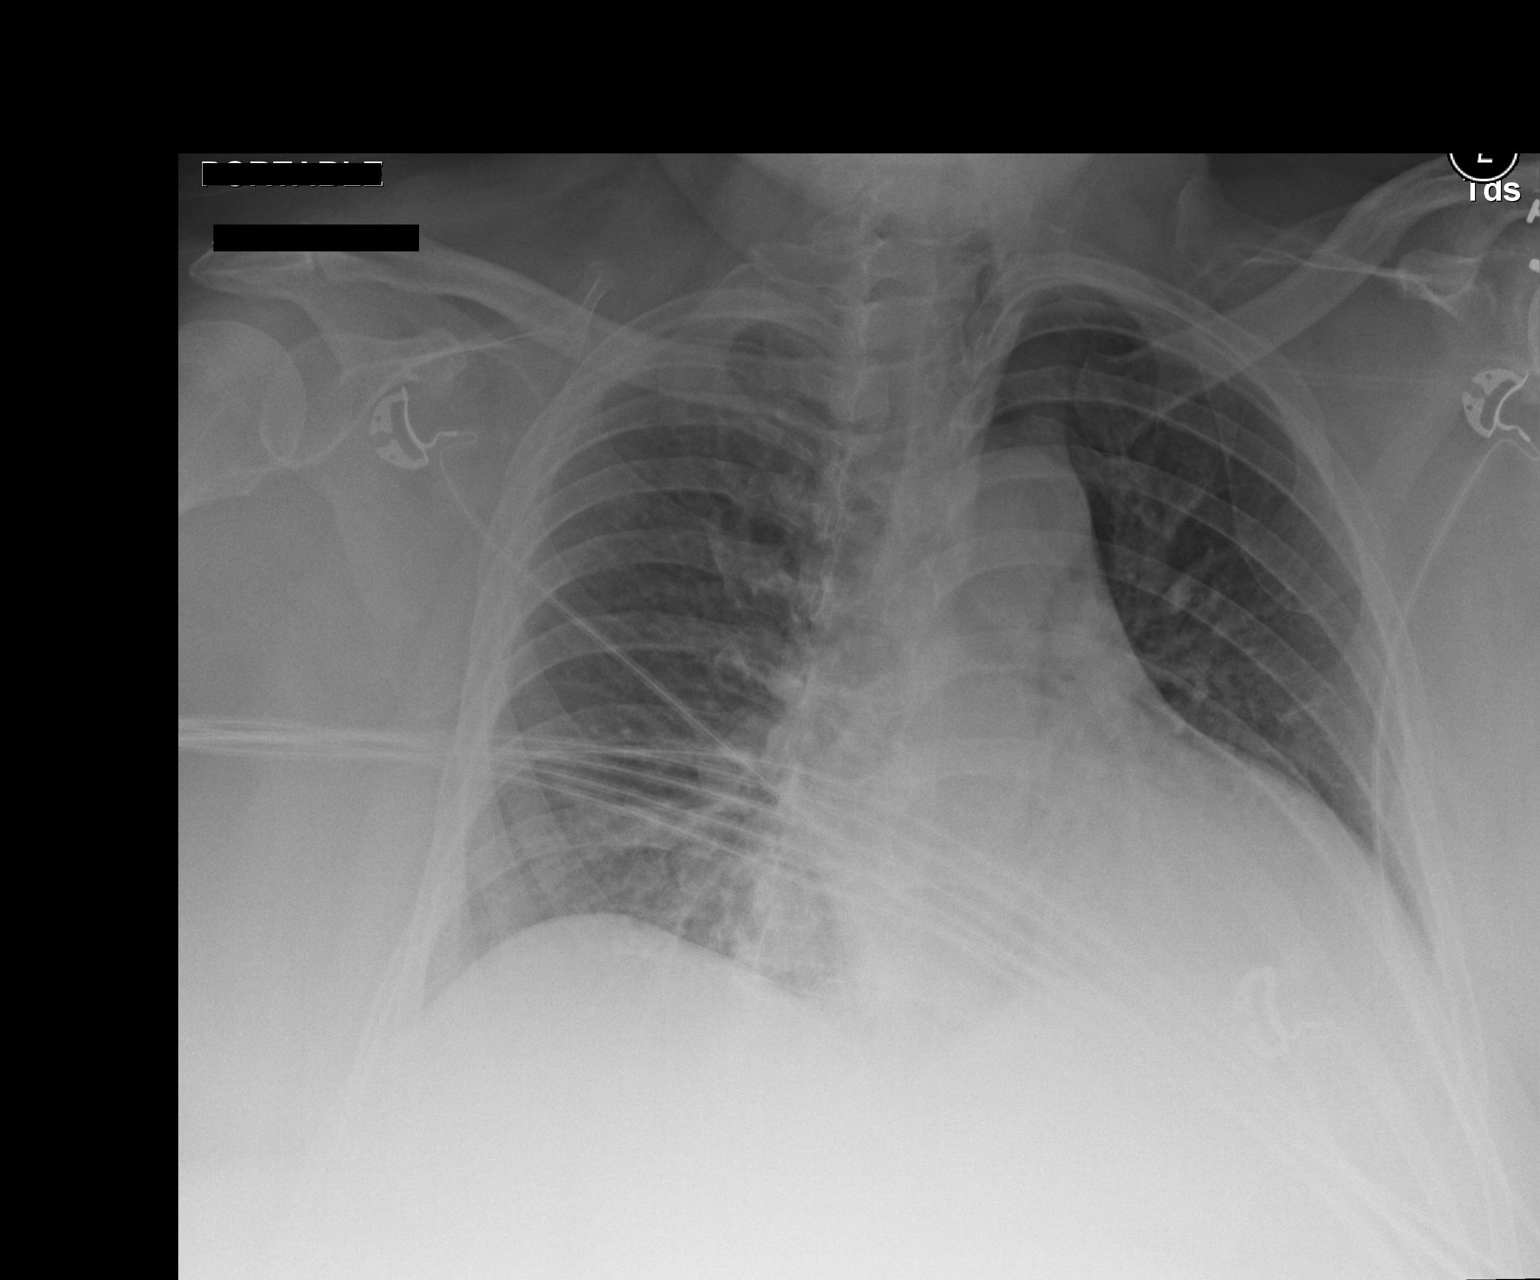

[1 of 1 positions shown; findings below may reference images not displayed]

FINDINGS: Cardiomegaly.

Pulmonary vascular prominence most notable centrally.

Increased markings left base may represent crowding of vessels or
subsegmental atelectasis.  Evaluation limited but without change.
No segmental infiltrate separate from this region.  No gross
pneumothorax.

Slightly tortuous aorta.
IMPRESSION: No significant change.

## 2013-08-21 IMAGING — CR DG CHEST 2V
2 series · 2 of 2 positions shown · non-contrast
Comparison: Portable chest x-ray 03/30/2012 and two-view chest x-
ray 03/27/2012.

CLINICAL DATA: Mid to left-sided chest pain.  Shortness of breath.
Prior MI.  Current history of hypertension, atrial fibrillation,
asthma.

CHEST - 2 VIEW

[w chest lat]
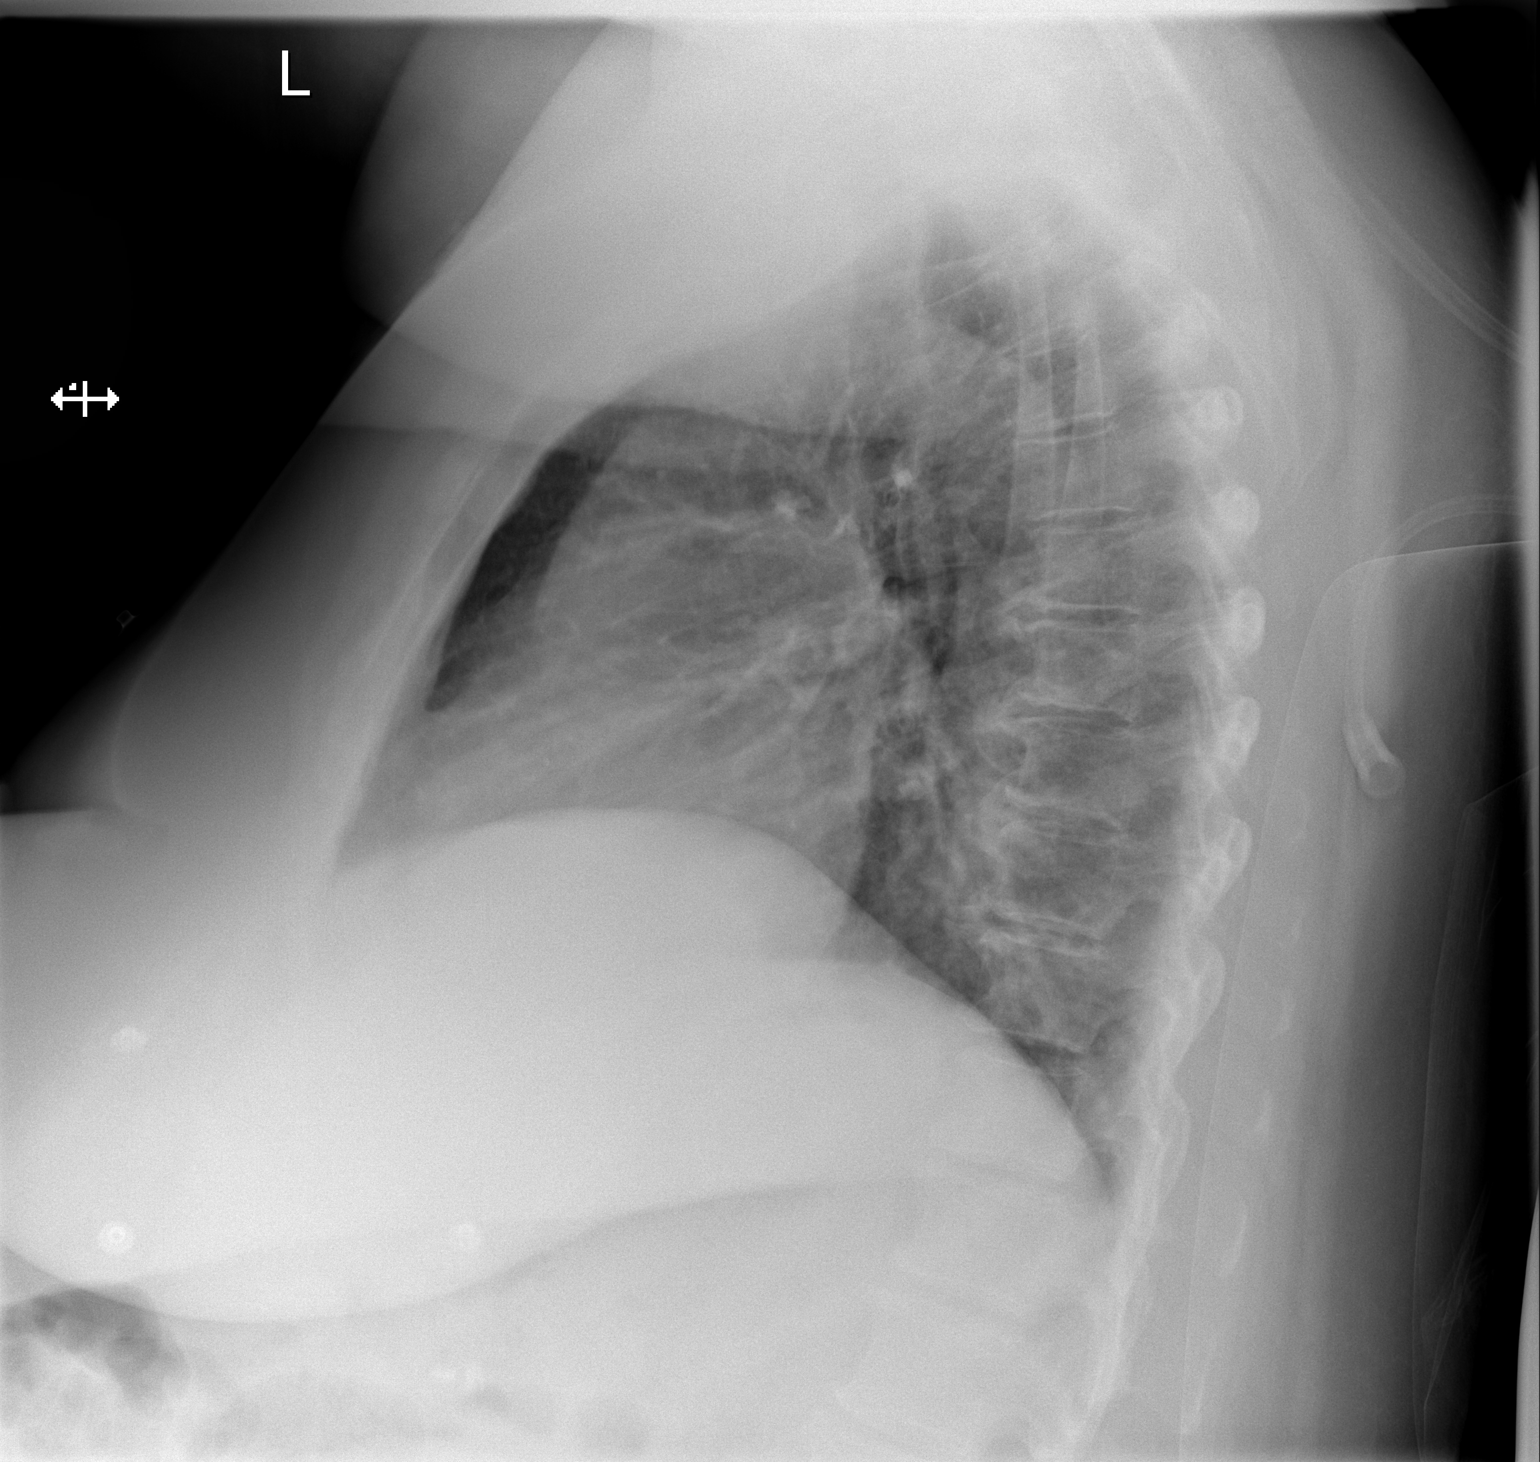

[x chest ap]
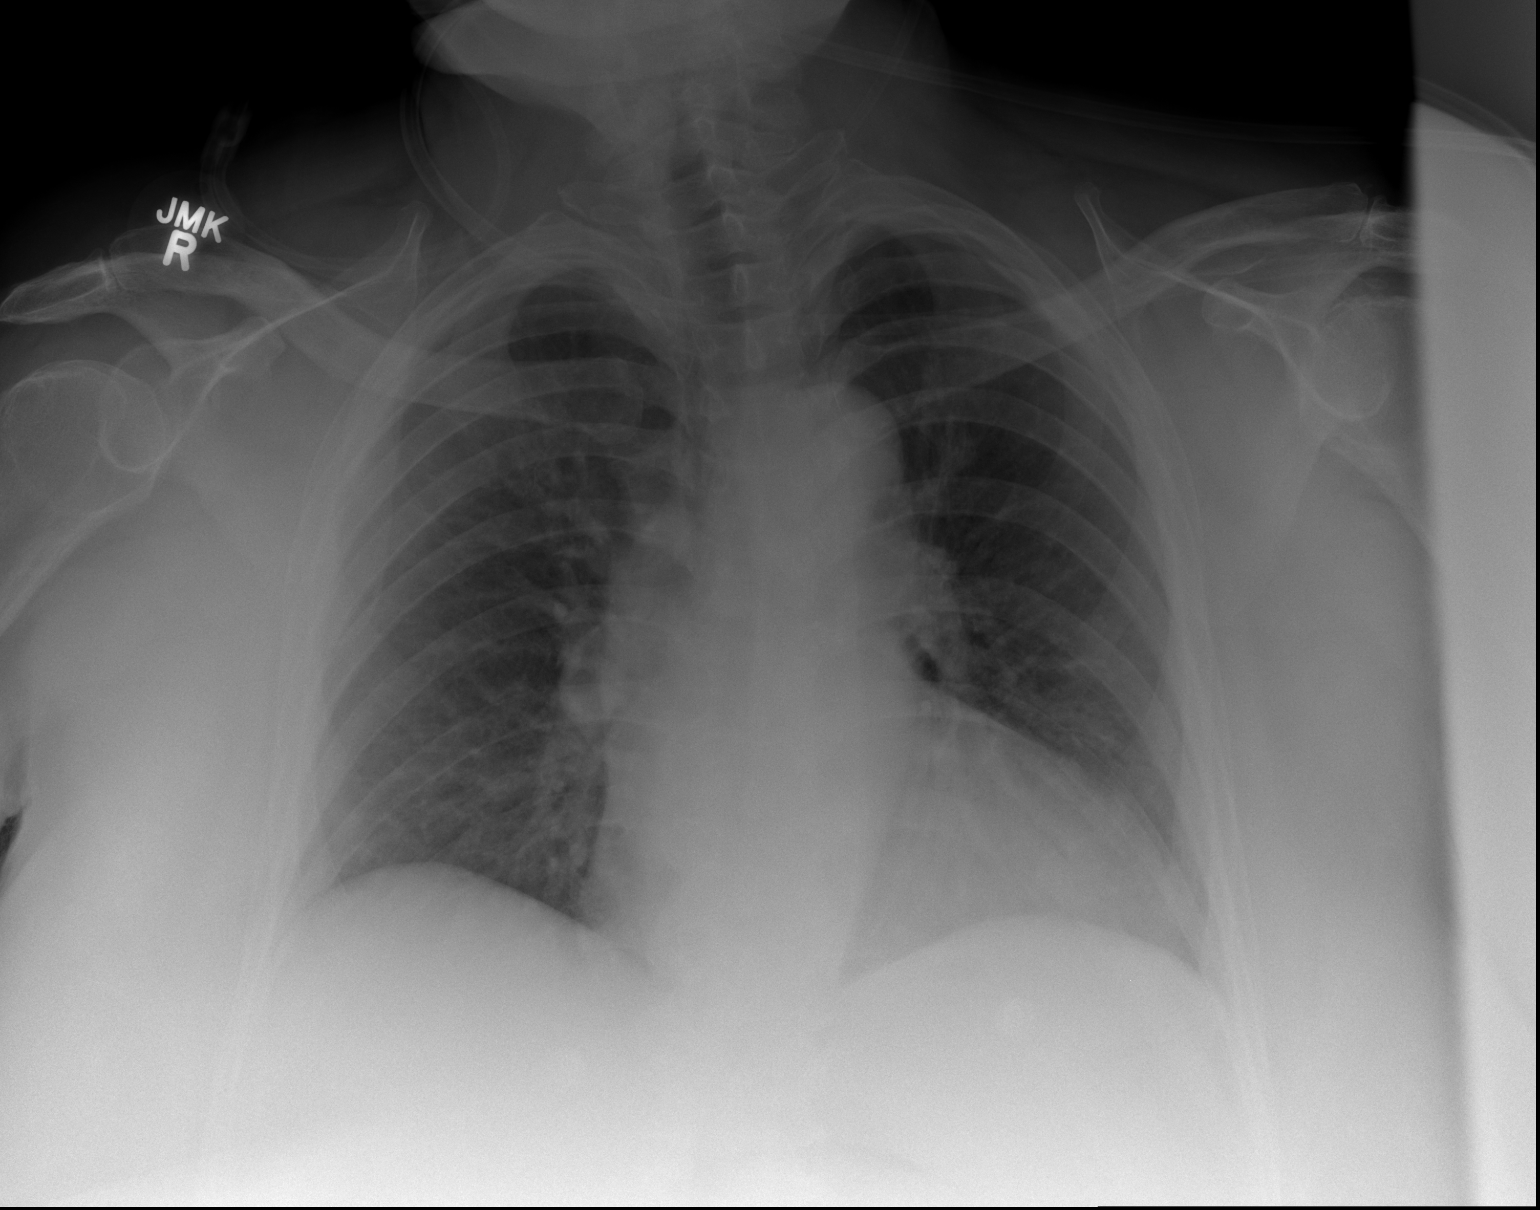

[2 of 2 positions shown; findings below may reference images not displayed]

FINDINGS: Suboptimal inspiration due to body habitus which accounts
for crowded bronchovascular markings at the bases and accentuates
the cardiac silhouette.  Taking this into account, cardiac
silhouette mildly enlarged but stable.  Lungs clear.  No pleural
effusions.  Pulmonary vascularity normal.  Degenerative changes
involving the thoracic spine.
IMPRESSION: Suboptimal inspiration.  Stable cardiomegaly.  No acute
cardiopulmonary disease.

## 2013-09-02 IMAGING — CR DG CHEST 1V PORT
1 series · 1 of 1 positions shown · non-contrast
Comparison: 04/23/2012

CLINICAL DATA: Chest pain

PORTABLE CHEST - 1 VIEW

[AP]
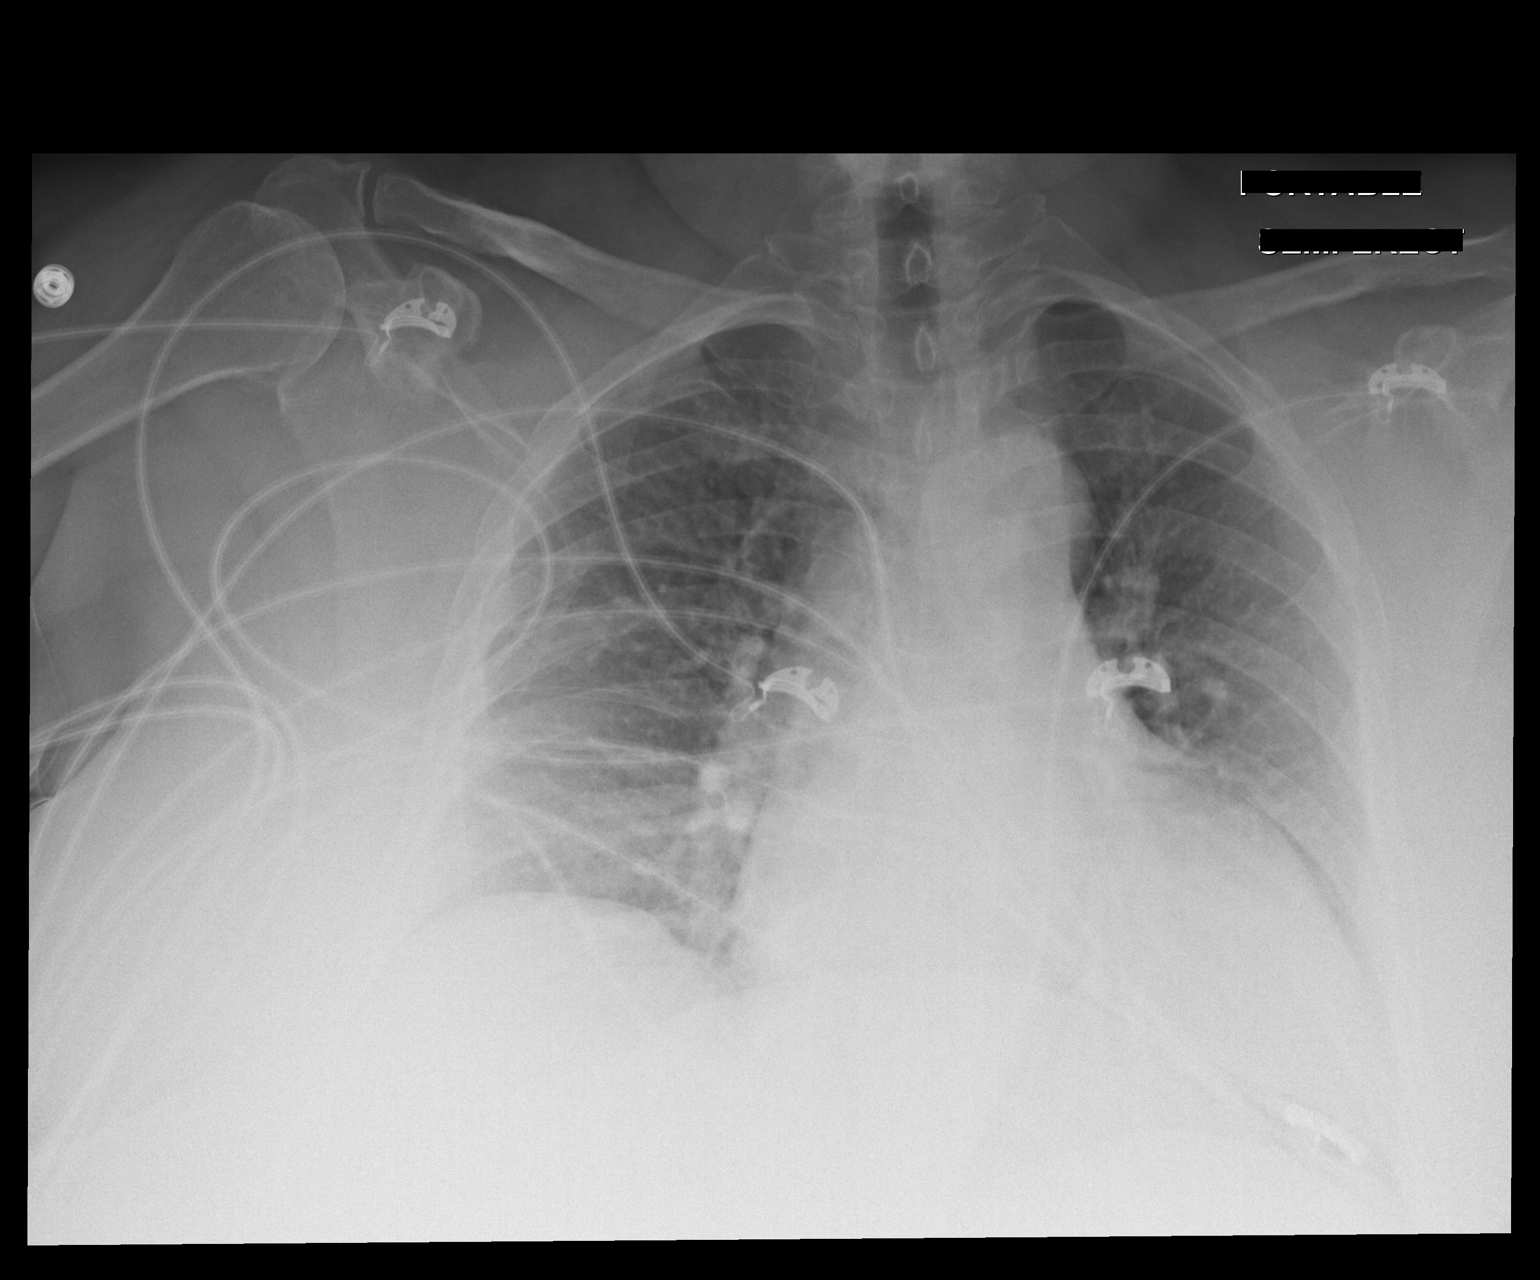

[1 of 1 positions shown; findings below may reference images not displayed]

FINDINGS: Cardiomegaly.  Central vascular congestion.  Mild
interstitial prominence.  Retrocardiac space is obscured.  No
pneumothorax.  Small effusions not excluded.  Limited osseous
evaluation due to technique/patient body habitus.  Multilevel
degenerative change.
IMPRESSION: Cardiomegaly with central vascular congestion.

Interstitial prominence may be accentuated by overlying soft
tissues. Retrocardiac space is obscured.  Otherwise, no confluent
airspace opacity.

Consider PA and lateral radiograph follow-up when the patient can
tolerate.

## 2013-09-11 ENCOUNTER — Encounter: Payer: Self-pay | Admitting: Internal Medicine

## 2013-09-11 ENCOUNTER — Non-Acute Institutional Stay: Payer: Self-pay | Admitting: Internal Medicine

## 2013-09-11 DIAGNOSIS — E032 Hypothyroidism due to medicaments and other exogenous substances: Secondary | ICD-10-CM

## 2013-09-11 DIAGNOSIS — J42 Unspecified chronic bronchitis: Secondary | ICD-10-CM

## 2013-09-11 DIAGNOSIS — M545 Low back pain, unspecified: Secondary | ICD-10-CM

## 2013-09-11 DIAGNOSIS — I10 Essential (primary) hypertension: Secondary | ICD-10-CM

## 2013-09-11 DIAGNOSIS — F250 Schizoaffective disorder, bipolar type: Secondary | ICD-10-CM

## 2013-09-11 NOTE — Progress Notes (Unsigned)
Patient ID: [name], female   DOB: [birth year], 63 y.o.   MRN: [id number]  Provider:  Gwenith Spitz. Renato Gails, D.O., C.M.D.  Location:  [facility] [place]  PCP: [name], MD  Code Status: FULL CODE  Allergies  Allergen Reactions  . Food Rash    "lasagna"= rash from the noodles  . Penicillins Hives  . Sulfamethoxazole-Trimethoprim Hives and Nausea And Vomiting    Chief Complaint  Patient presents with  . Medical Managment of Chronic Issues    HPI: 63 y.o. female with schizoaffecive disorder, chf, asthma, copd, obesity, hypothyroidism, gerd here for long term care seen for med mgt chr diseases.    ROS: Review of Systems  Constitutional: Negative for fever and malaise/fatigue.  HENT: Negative for congestion and hearing loss.   Eyes: Negative for blurred vision.  Respiratory: Negative for shortness of breath.   Cardiovascular: Negative for chest pain, palpitations and leg swelling.  Gastrointestinal: Negative for abdominal pain, constipation, blood in stool and melena.  Genitourinary: Negative for dysuria.  Musculoskeletal: Positive for myalgias. Negative for falls.  Neurological: Positive for weakness. Negative for dizziness and headaches.  Endo/Heme/Allergies: Does not bruise/bleed easily.  Psychiatric/Behavioral: Positive for depression and hallucinations.       Delusional    Past Medical History  Diagnosis Date  . Schizoaffective disorder   . Urinary tract infection   . Hypertension   . GERD (gastroesophageal reflux disease)   . Hypothyroidism   . Obesity   . Asthma   . COPD (chronic obstructive pulmonary disease)   . CHF (congestive heart failure)   . Seizures   . Morbid obesity   . Chronic pain   . Hypercholesteremia   . Anginal pain   . Myocardial infarction 2002  . Diabetes mellitus     "I'm not diabetic anymore" (05/23/2012)  . Anemia   . History of blood transfusion 1993    "when I had my hysterectomy" (05/23/2012)  . H/O hiatal  hernia   . Daily headache   . Arthritis     "severe; all over my body" (05/23/2012)  . Hypoventilation syndrome     [name] 05/30/2012  . Atrial fibrillation   . Atrial flutter, paroxysmal     [name] 05/30/2012  . Exertional dyspnea   . Shortness of breath     "all the time lately" (05/30/2012)  . Pneumonia     "several times; including now" (04/23/2013)   Past Surgical History  Procedure Laterality Date  . Tubal ligation  1998  . Cholecystectomy  ?2008  . Cardiac catheterization  2008  . Vaginal hysterectomy  1993   Social History:   reports that she quit smoking about 13 years ago. Her smoking use included Cigarettes. She has a 24 pack-year smoking history. Her smokeless tobacco use includes Chew. She reports that she does not drink alcohol or use illicit drugs.  Family History  Problem Relation Age of Onset  . Heart disease Father   . Cancer Mother 63    Breast    Medications: Patient's Medications  New Prescriptions   No medications on file  Previous Medications   ALBUTEROL (PROVENTIL HFA;VENTOLIN HFA) 108 (90 BASE) MCG/ACT INHALER    Inhale 2 puffs into the lungs every 6 (six) hours as needed for wheezing.   ASPIRIN EC 81 MG TABLET    Take 81 mg by mouth every morning.    BUDESONIDE (PULMICORT) 0.25 MG/2ML NEBULIZER SOLUTION    Take 0.25 mg by nebulization 2 (two) times  daily.   CHLORPROMAZINE (THORAZINE) 100 MG TABLET    Take 100 mg by mouth 2 times daily at 12 noon and 4 pm. AND 200 MG PO Q hs   DIVALPROEX (DEPAKOTE ER) 500 MG 24 HR TABLET    Take 1,000 mg by mouth at bedtime.   LAMOTRIGINE (LAMICTAL) 100 MG TABLET    Take 100 mg by mouth 2 (two) times daily.   LEVOTHYROXINE (SYNTHROID, LEVOTHROID) 150 MCG TABLET    Take 150 mcg by mouth every morning. For thyroid disease.   LITHIUM 600 MG CAPSULE    Take 600 mg by mouth 2 (two) times daily with a meal.   MULTIPLE VITAMIN (MULTIVITAMIN) TABLET    Take 1 tablet by mouth daily.   NITROGLYCERIN (NITROSTAT) 0.4 MG SL  TABLET    Place 0.4 mg under the tongue every 5 (five) minutes as needed for chest pain (may repeat for 2 doses).    OLANZAPINE (ZYPREXA) 15 MG TABLET    Take 30 mg by mouth at bedtime.   OMEPRAZOLE (PRILOSEC) 20 MG CAPSULE    Take 40 mg by mouth every morning.    ONDANSETRON (ZOFRAN) 4 MG TABLET    Take 4 mg by mouth every 8 (eight) hours as needed for nausea or vomiting.   POLYETHYLENE GLYCOL (MIRALAX / GLYCOLAX) PACKET    Take 17 g by mouth 2 (two) times a week. Tuesday and Friday  Modified Medications   No medications on file  Discontinued Medications   No medications on file     Physical Exam:  Physical Exam  Vitals reviewed. Constitutional: She is oriented to person, place, and time.  Morbidly obese white female in her bed  Cardiovascular: Normal rate, regular rhythm, normal heart sounds and intact distal pulses.   Pulmonary/Chest: Effort normal and breath sounds normal. No respiratory distress.  Abdominal: Soft. Bowel sounds are normal. She exhibits no distension and no mass. There is no tenderness.  Musculoskeletal: Normal range of motion.  Neurological: She is alert and oriented to person, place, and time.  Delusional thoughts that she is pregnant  Skin: Skin is warm and dry.     Labs reviewed: Basic Metabolic Panel: 07/25/2013: G 79, Ca 10.1, BUN 12, Cre 0.68, Na 143, K 4.6  Recent Labs  04/23/13 1600 04/24/13 0805 05/20/13 1840  NA 140 137 134*  K 4.2 3.8 3.9  CL 102 103 98  CO2 31 29 28   GLUCOSE 83 91 102*  BUN 11 12 7   CREATININE 0.56 0.50 0.56  CALCIUM 9.6 9.2 10.2   Liver Function Tests:  Recent Labs  04/23/13 1600 04/24/13 0805 05/20/13 1840  AST 17 11 11   ALT 7 6 6   ALKPHOS 63 61 71  BILITOT 0.3 0.5 0.2*  PROT 6.8 6.2 7.4  ALBUMIN 2.8* 2.6* 3.3*   No results found for this basename: LIPASE, AMYLASE,  in the last 8760 hours No results found for this basename: AMMONIA,  in the last 8760 hours CBC: 07/25/2013: WBC 8, H/H 11.5/35.1, PLT  222  Recent Labs  02/21/13 1945  04/23/13 1600 04/24/13 0805 04/25/13 0510 05/20/13 1840  WBC 11.6*  < > 6.5 6.6 5.3 7.4  NEUTROABS 8.6*  --  3.4 3.6  --   --   HGB 13.2  < > 12.8 12.2 12.0 11.9*  HCT 40.0  < > 38.0 36.1 35.1* 36.2  MCV 100.0  < > 98.7 100.0 99.4 100.3*  PLT 275  < > 208 192 194 236  < > =  values in this interval not displayed. Cardiac Enzymes:  Recent Labs  02/21/13 1945  CKTOTAL 137   BNP: 07/25/2013: 20.9 CBG:  Recent Labs  05/23/13 0755 05/23/13 1133 05/23/13 1644  GLUCAP 103* 104* 99   Lithium level: 07/05/2013: 1.09  Valproic acid level: 07/05/2013: 65  Thyroid function: 07/05/2013: TSH 2.73, T4 7.5, TU 29.49  Imaging and Procedures: 07/24/2013: CXR for wheezing/congestion: small left pleural effusion; patchy pneumonitis or edema at the mid and lower bilateral lungs; minimal cardiomegaly with mild-to-moderate pulmonary vascular congestion. (Pt. Already being treated with antibiotics at the time of x-ray, lasix 20mg  PO QD x 3 days and a stat BNP was ordered)  Assessment/Plan 1. Schizoaffective disorder, bipolar type -cont management as per NCEPS -nursing staff can sometimes get her to adhere by giving her her snuff only if she does things  2. Morbid obesity -due to this, she is essentially immobile  3. Essential hypertension, benign -cont bp regimen  4. Chronic bronchitis, unspecified chronic bronchitis type -stable w/o evidence of exacerbation at present  5. Hypothyroidism due to medication -cont syntrhoid, f/u tsh  6. Midline low back pain without sciatica -stable on current regimen Functional status: dependent in adls except feeding  Family/ staff Communication: discussed with nurse  Labs/tests ordered:  tsh

## 2013-09-17 IMAGING — CR DG CHEST 2V
2 series · 2 of 2 positions shown · non-contrast
Comparison: 05/05/2012

CLINICAL DATA: Fall.  Chest injury and chest pain.

CHEST - 2 VIEW

[x chest ap]
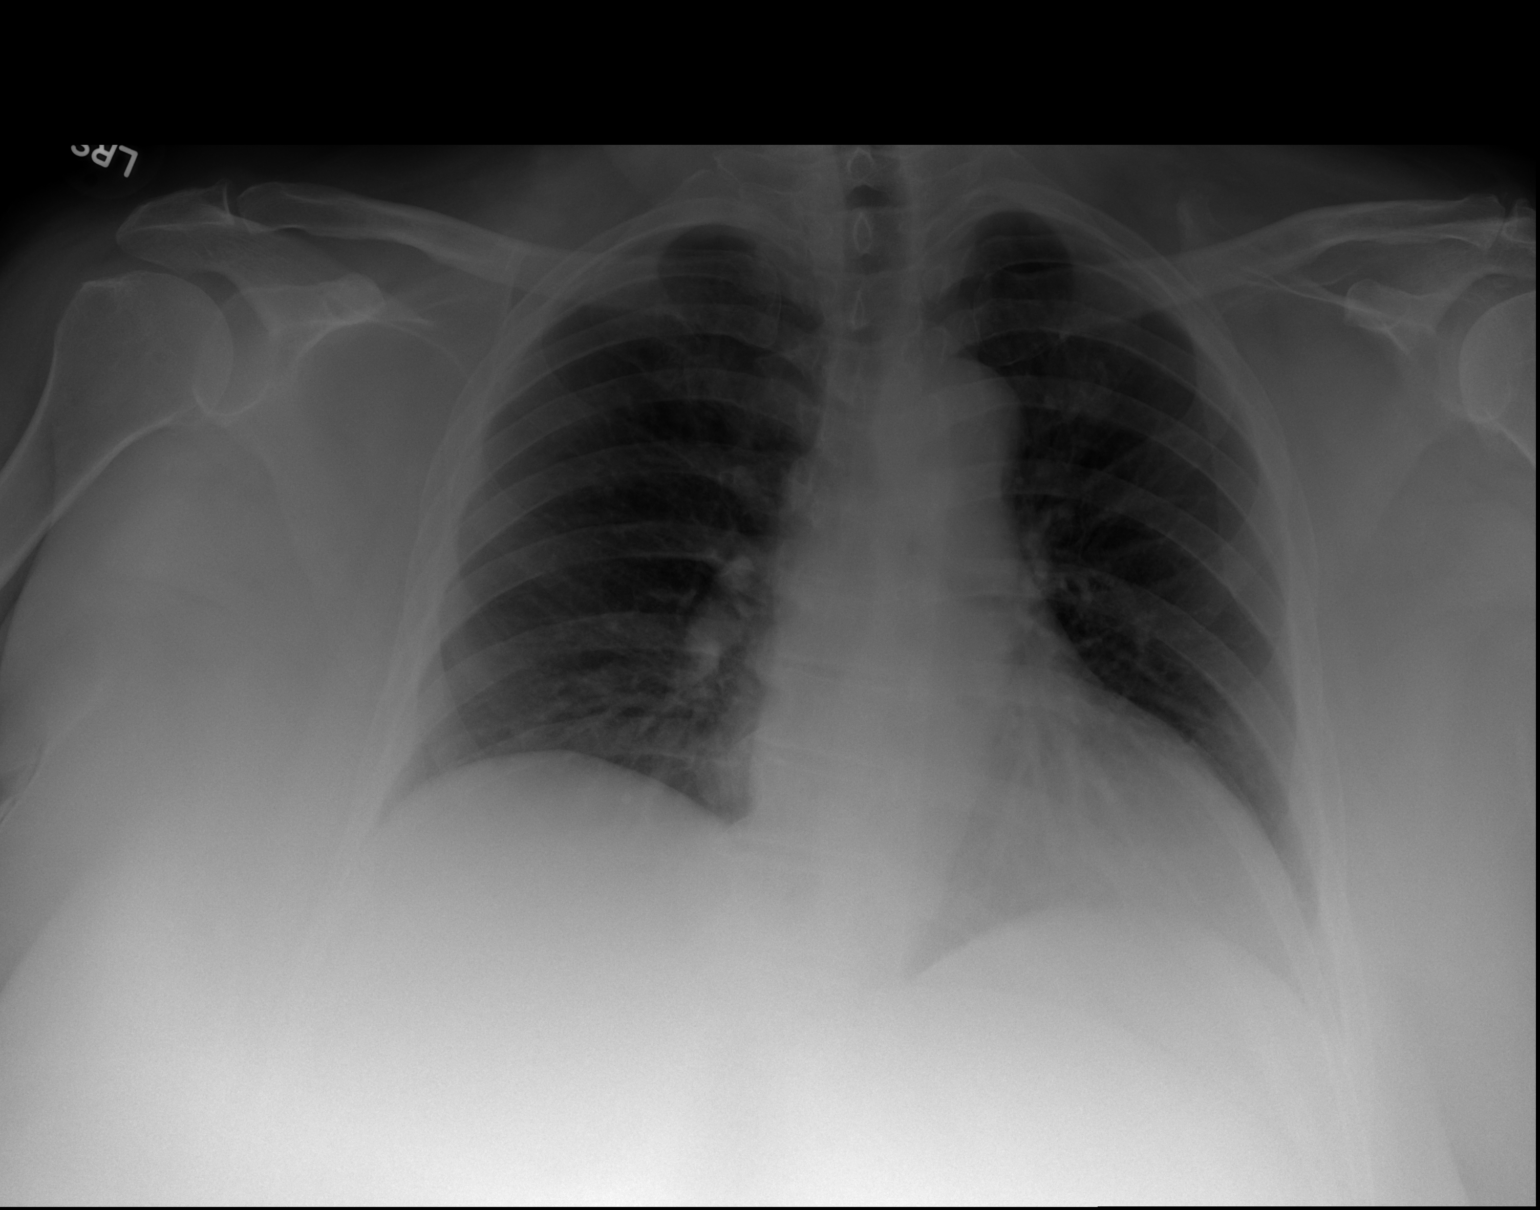

[w chest lat]
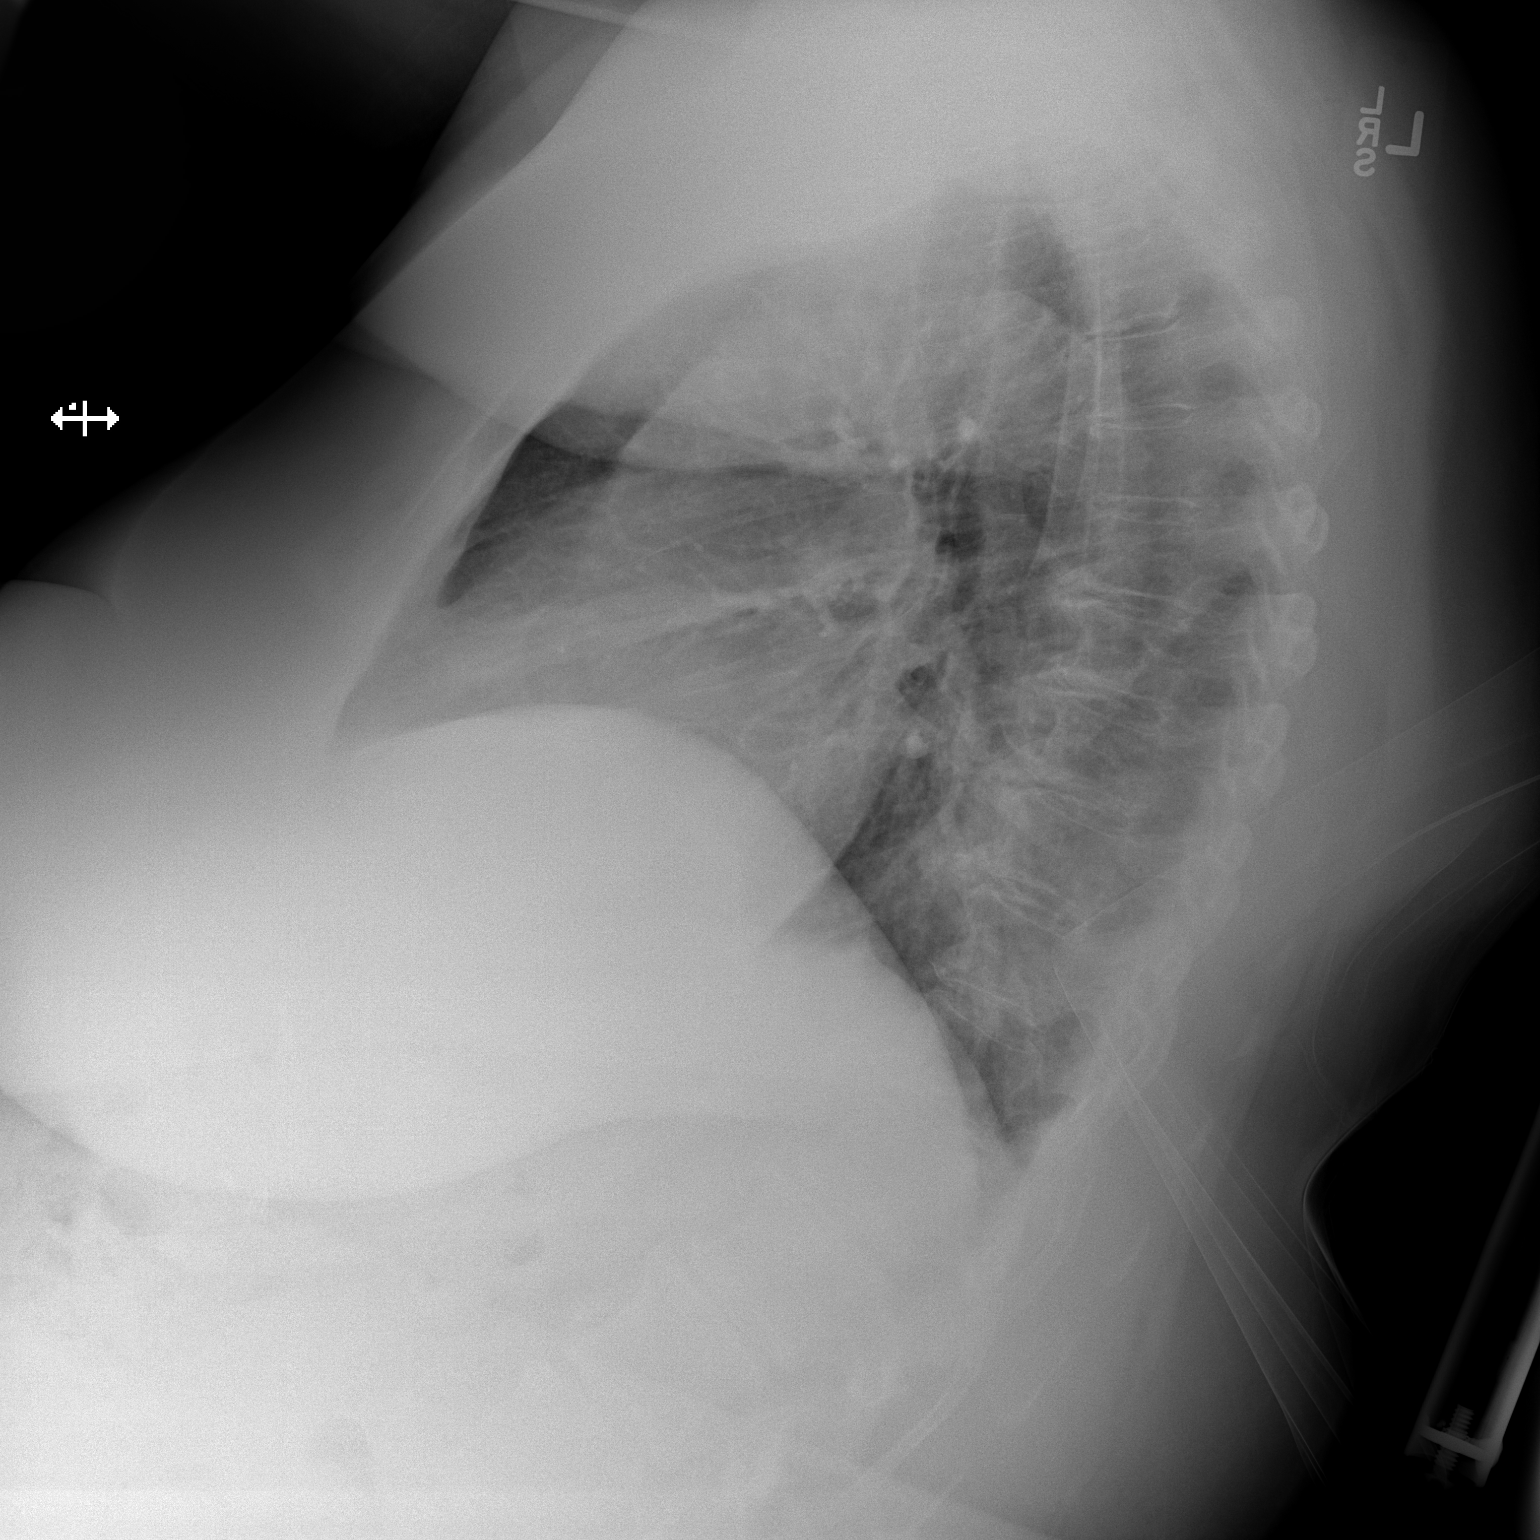

[2 of 2 positions shown; findings below may reference images not displayed]

FINDINGS: The heart size and mediastinal contours are within
normal limits.  Both lungs are clear.  No evidence of pneumothorax
or hemothorax.  The visualized skeletal structures are
unremarkable.
IMPRESSION: No active cardiopulmonary disease.

## 2013-09-18 IMAGING — CT CT HEAD W/O CM
4 of 6 series · 16 of 47 positions shown, 18 images · non-contrast
Comparison: CT of the head performed 02/09/2012

CT HEAD

CLINICAL DATA: Hit in chest by chair; fell off bed and hit head.
Concern for cervical spine injury.

CT HEAD WITHOUT CONTRAST AND CT CERVICAL SPINE WITHOUT CONTRAST
TECHNIQUE: Multidetector CT imaging of the head and cervical spine
was performed following the standard protocol without intravenous
contrast.  Multiplanar CT image reconstructions of the cervical
spine were also generated.

[Series 3: recon 2: brain · axial · 0.47mm/px · z∈[-73,-30]mm · 3 of 56 slices shown]
[im 8/56  brain]
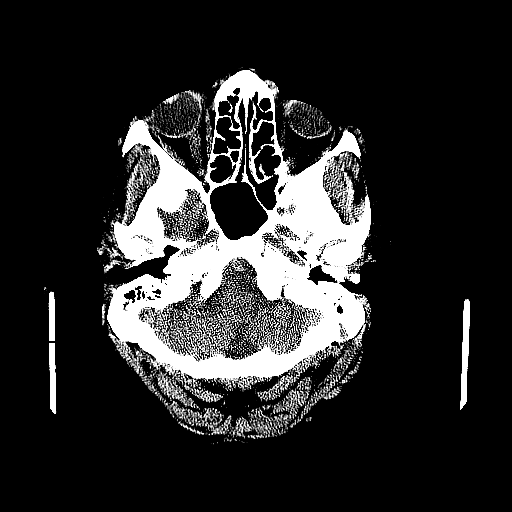
[im 16/56  brain]
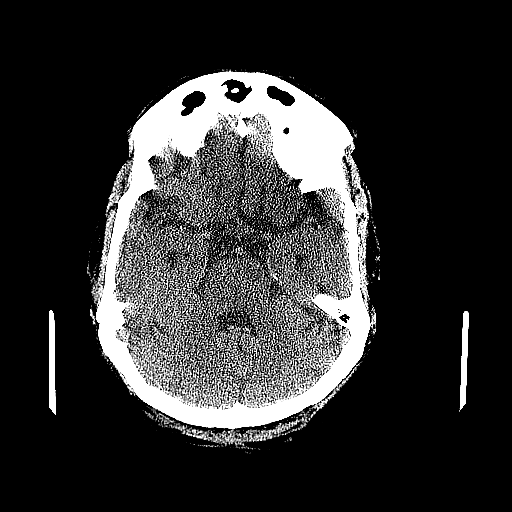
[im 24/56  brain]
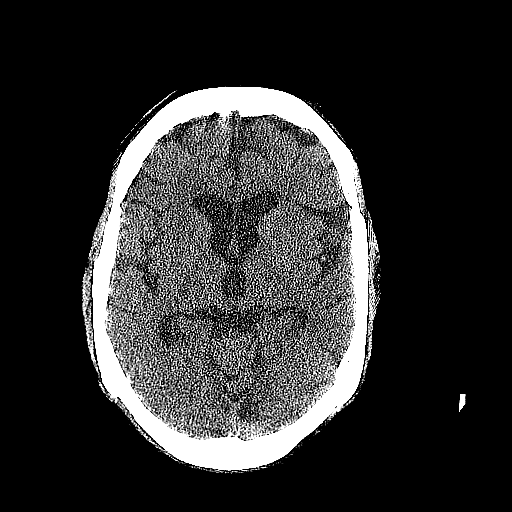

[Series 600: orthogonal · axial · 0.29mm/px · z∈[-301,-169]mm · 7 of 64 slices shown, 9 images]
[im 8/64  brain]
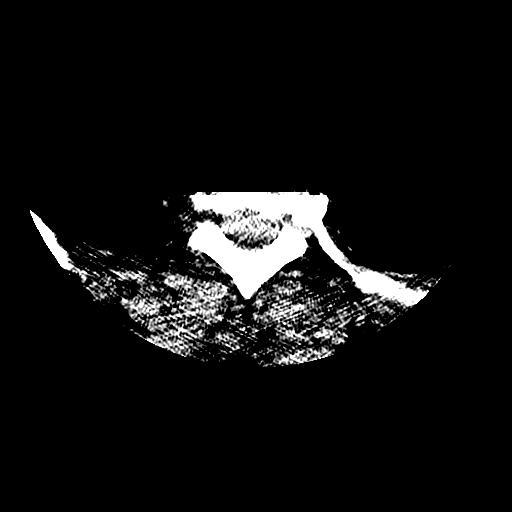
[im 8/64  bone]
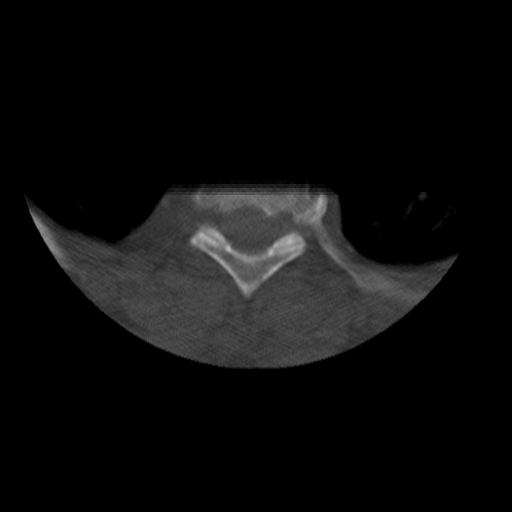
[im 16/64  brain]
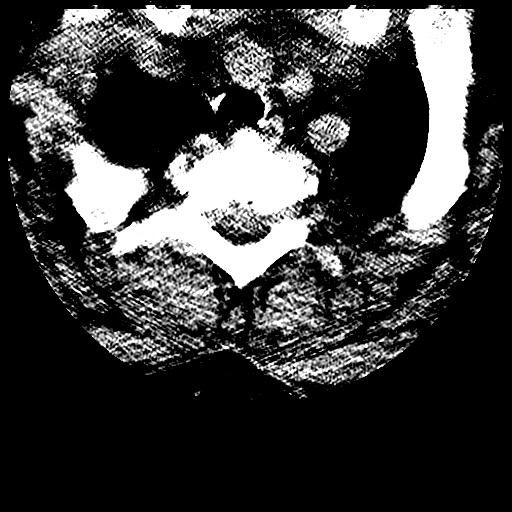
[im 24/64  brain]
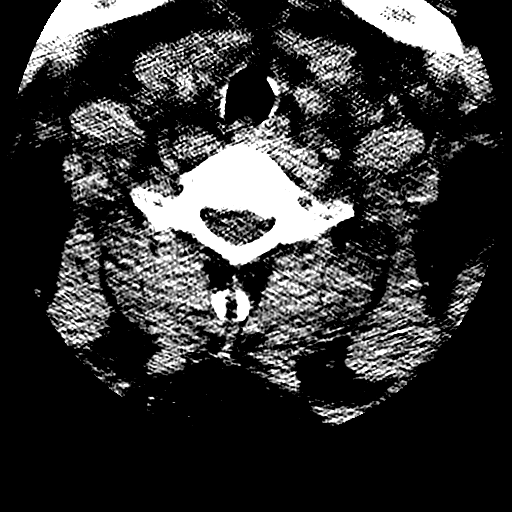
[im 32/64  brain]
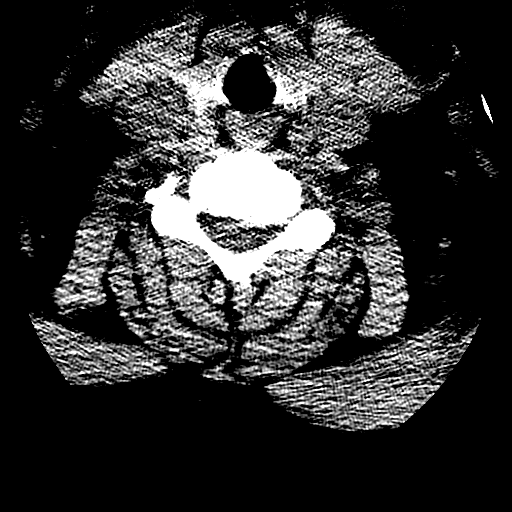
[im 40/64  brain]
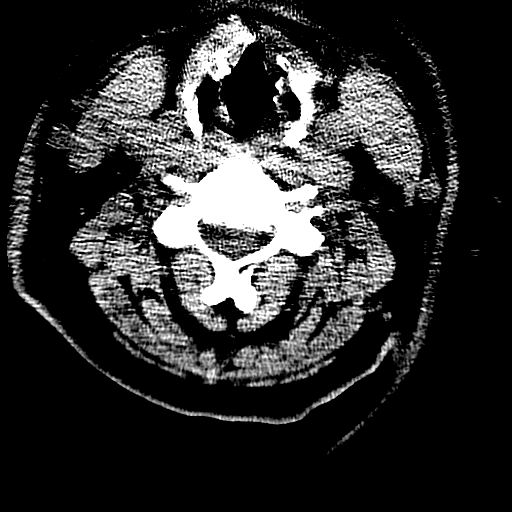
[im 40/64  bone]
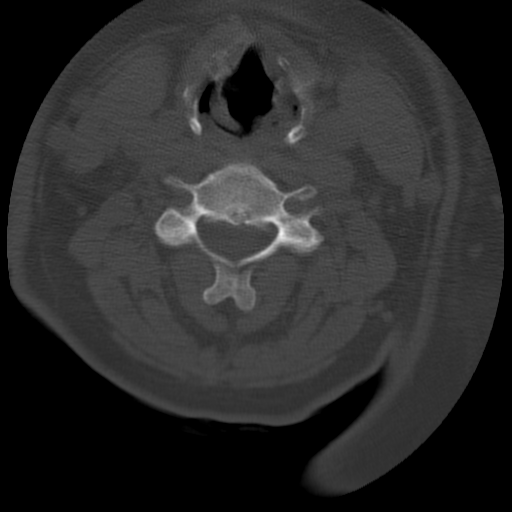
[im 48/64  brain]
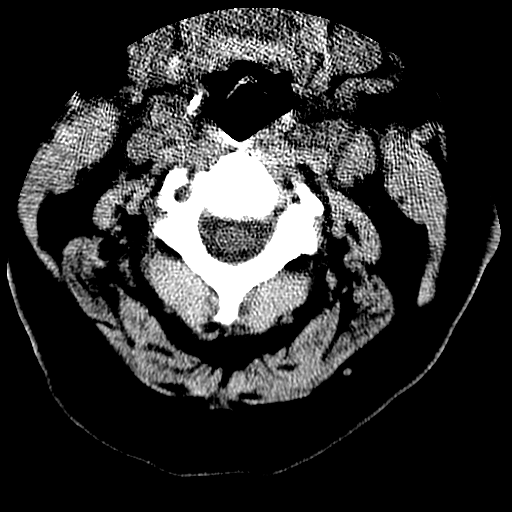
[im 56/64  brain]
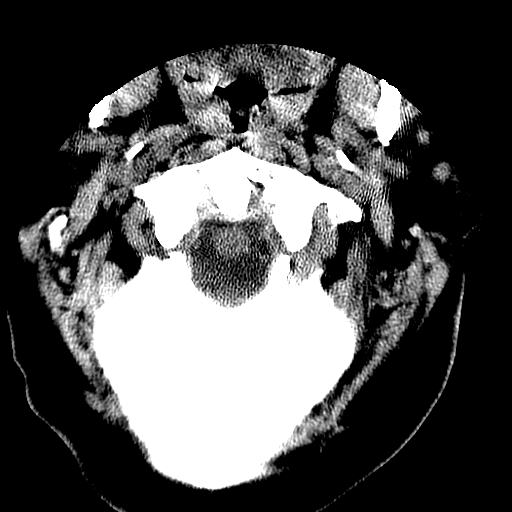

[Series 601: coronal · coronal · 0.36mm/px · 3 of 29 slices shown]
[im 10/29  brain]
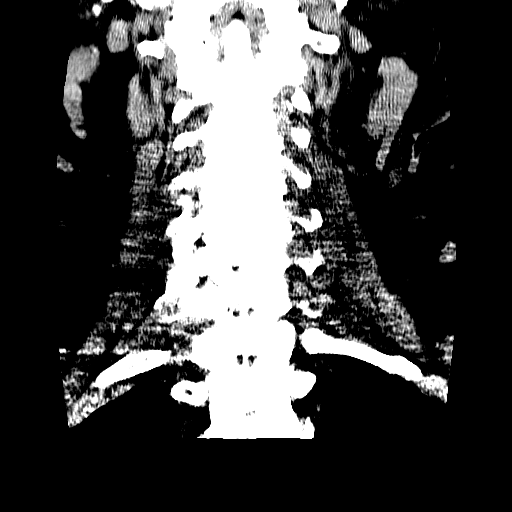
[im 13/29  brain]
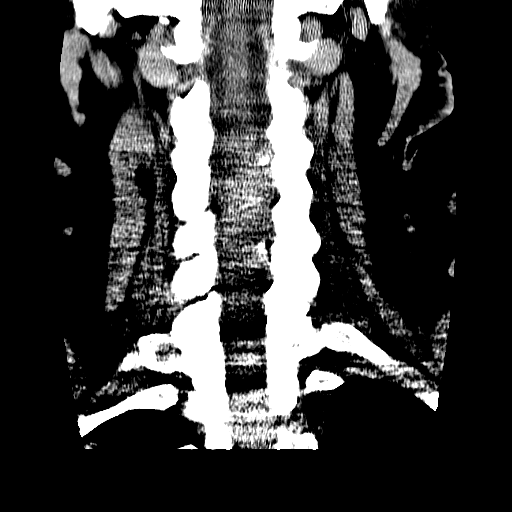
[im 16/29  brain]
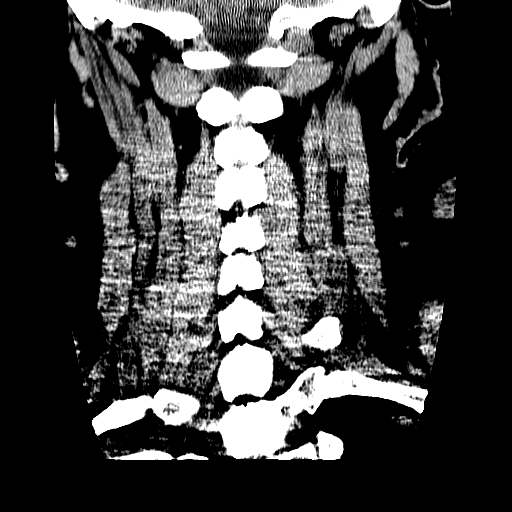

[Series 602: sagittals · sagittal · 0.36mm/px · 3 of 31 slices shown]
[im 11/31  brain]
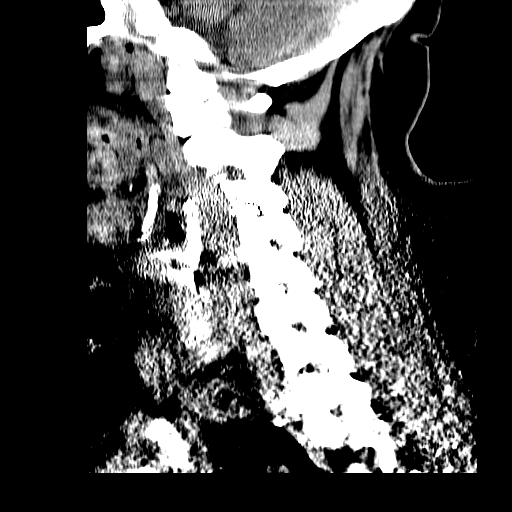
[im 16/31  brain]
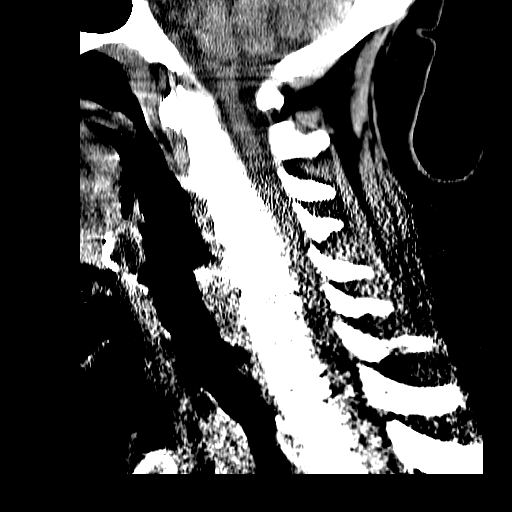
[im 21/31  brain]
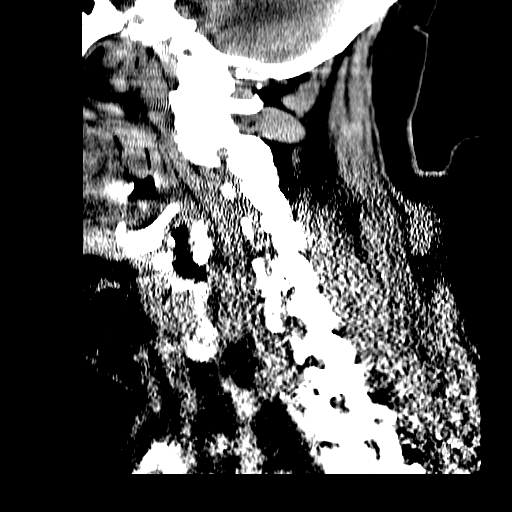

[16 of 47 positions shown; findings below may reference images not displayed]

FINDINGS: There is no evidence of acute infarction, mass lesion, or
intra- or extra-axial hemorrhage on CT.

The posterior fossa, including the cerebellum, brainstem and fourth
ventricle, is within normal limits.  The third and lateral
ventricles, and basal ganglia are unremarkable in appearance.  The
cerebral hemispheres are symmetric in appearance, with normal gray-
white differentiation.  No mass effect or midline shift is seen.

There is no evidence of fracture; a likely 1.8 cm frontal osteoma
is incidentally seen.  The visualized portions of the orbits are
within normal limits.  The paranasal sinuses and mastoid air cells
are well-aerated.  No significant soft tissue abnormalities are
seen.
IMPRESSION: 1.  No evidence of traumatic intracranial injury or fracture.
2.  Likely 1.8 cm frontal osteoma incidentally noted.

CT CERVICAL SPINE
FINDINGS: There is no evidence of fracture or subluxation.
Vertebral bodies demonstrate normal height and alignment.  There is
minimal narrowing of the intervertebral disc space at C4-C5, with
small anterior and posterior disc osteophyte complexes noted at
multiple levels along the cervical spine.  Prevertebral soft
tissues are within normal limits.

The thyroid gland is unremarkable in appearance.  The visualized
lung apices are clear.  No significant soft tissue abnormalities
are seen.
IMPRESSION: 1.  No evidence of fracture or subluxation along the cervical
spine.
2.  Minimal degenerative change noted along the cervical spine.

## 2013-09-18 IMAGING — CR DG TIBIA/FIBULA 2V*L*
2 series · 2 of 2 positions shown · non-contrast
Comparison: None.

CLINICAL DATA: Fall.  Left leg pain.

LEFT TIBIA AND FIBULA - 2 VIEW

[x tib-fib ap left]
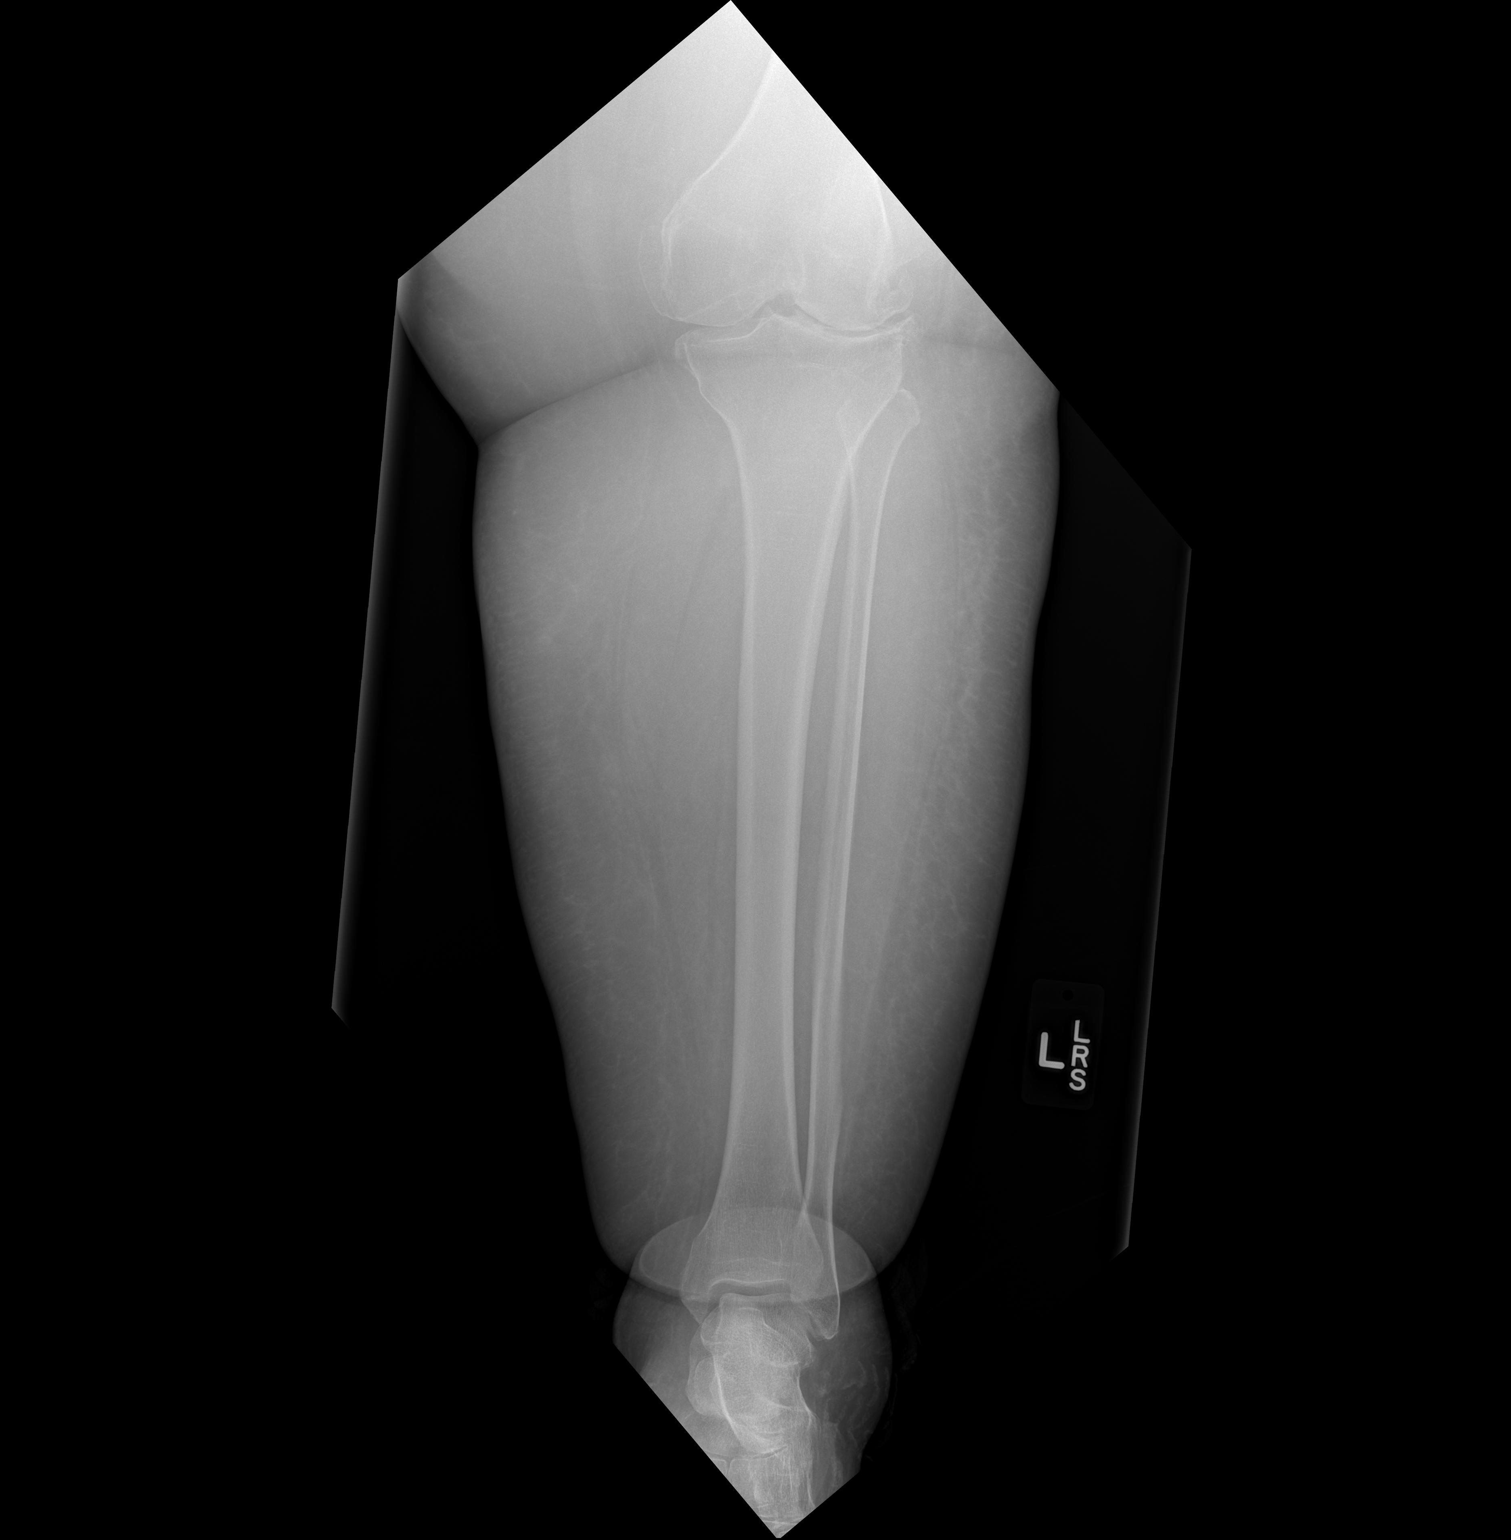

[x tib-fib lat left]
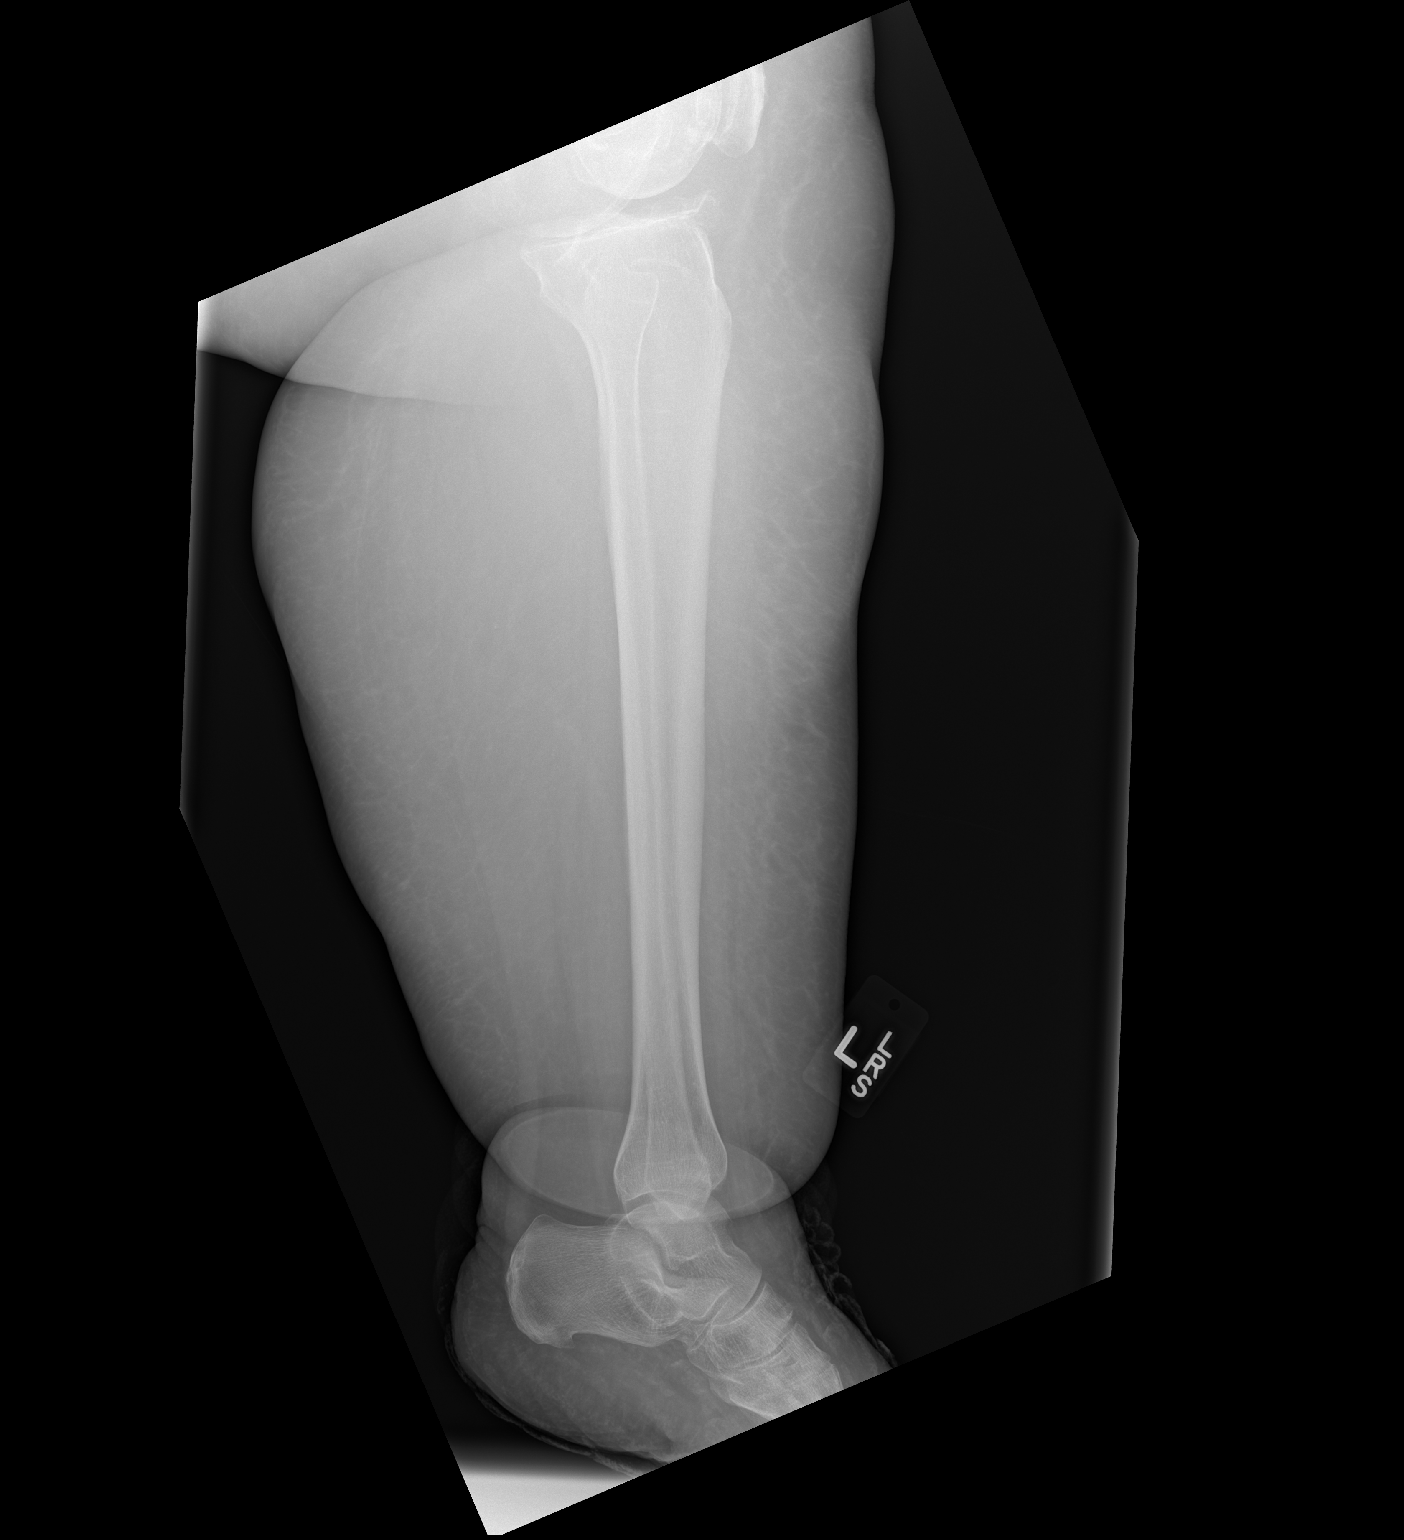

[2 of 2 positions shown; findings below may reference images not displayed]

FINDINGS: No evidence of acute fracture or dislocation.
Osteoarthritis is seen involving the knee joint.  No other
significant bone abnormality identified.
IMPRESSION: No acute findings.  Knee osteoarthritis.

## 2013-09-20 IMAGING — CR DG CHEST 2V
2 series · 2 of 2 positions shown · non-contrast
Comparison: 05/23/2012.

CLINICAL DATA: The chest pain, shortness of breath.

CHEST - 2 VIEW

[x chest ap]
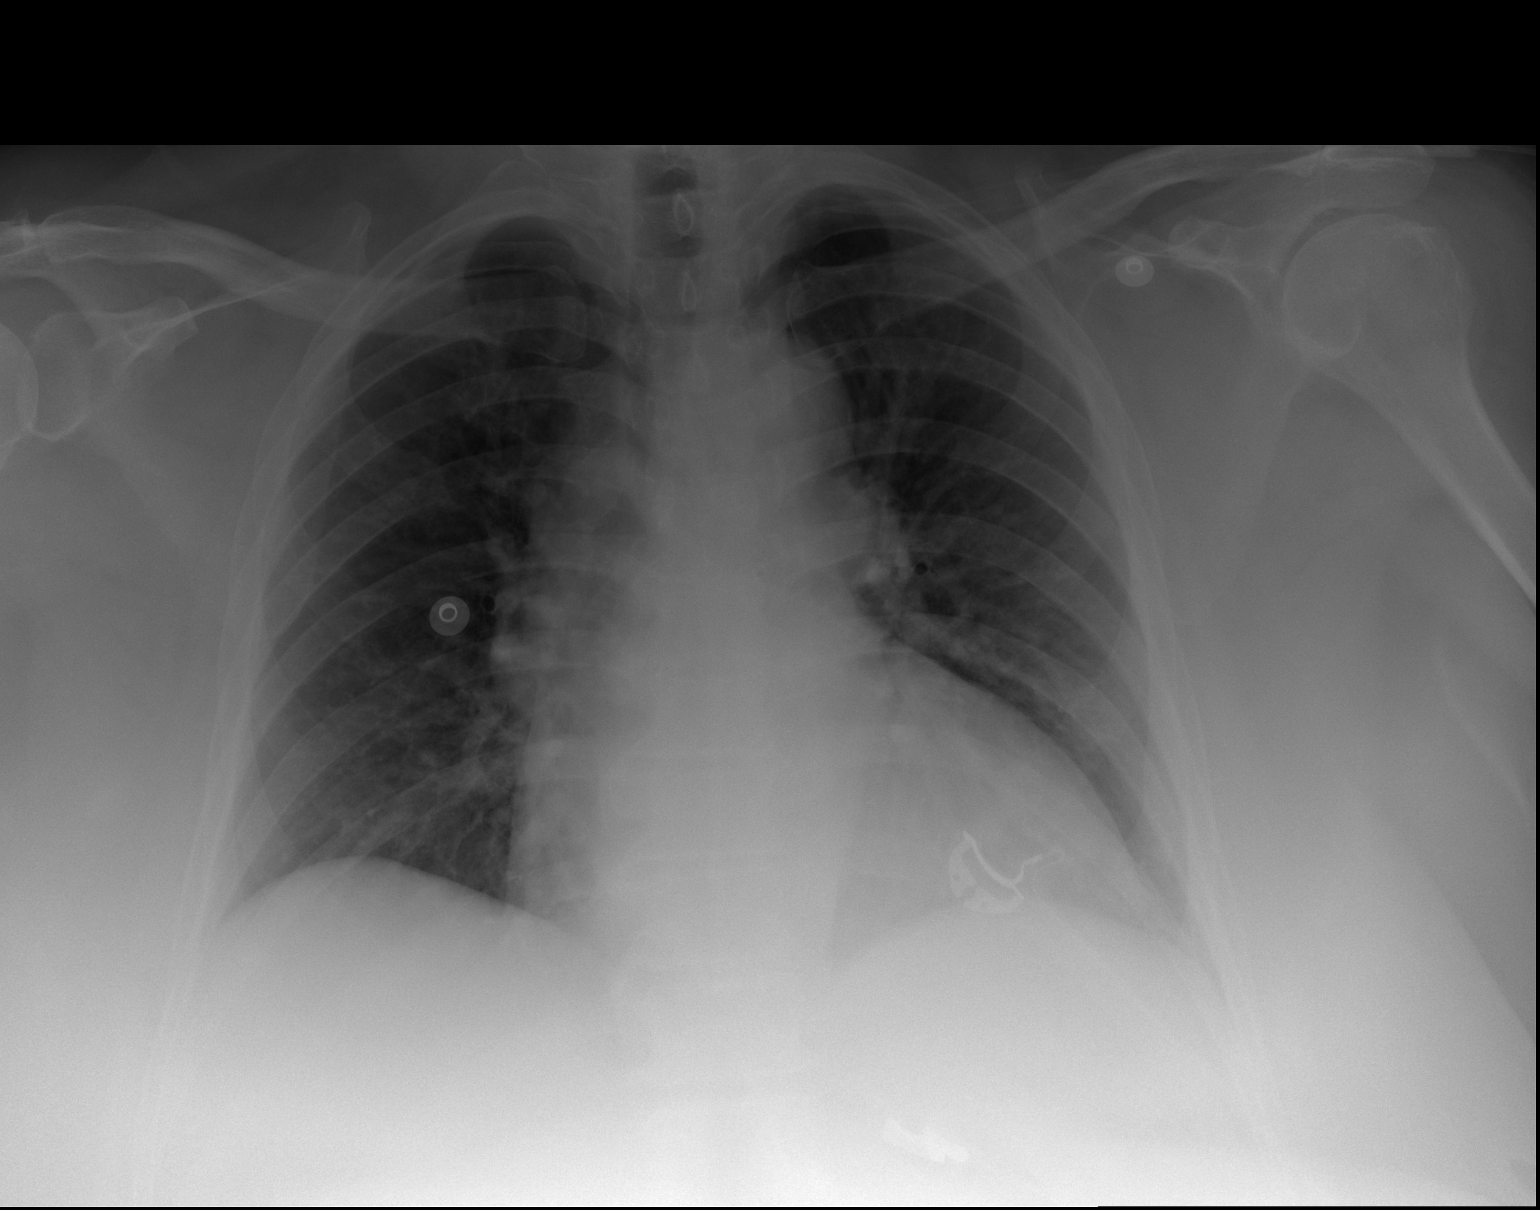

[w chest lat]
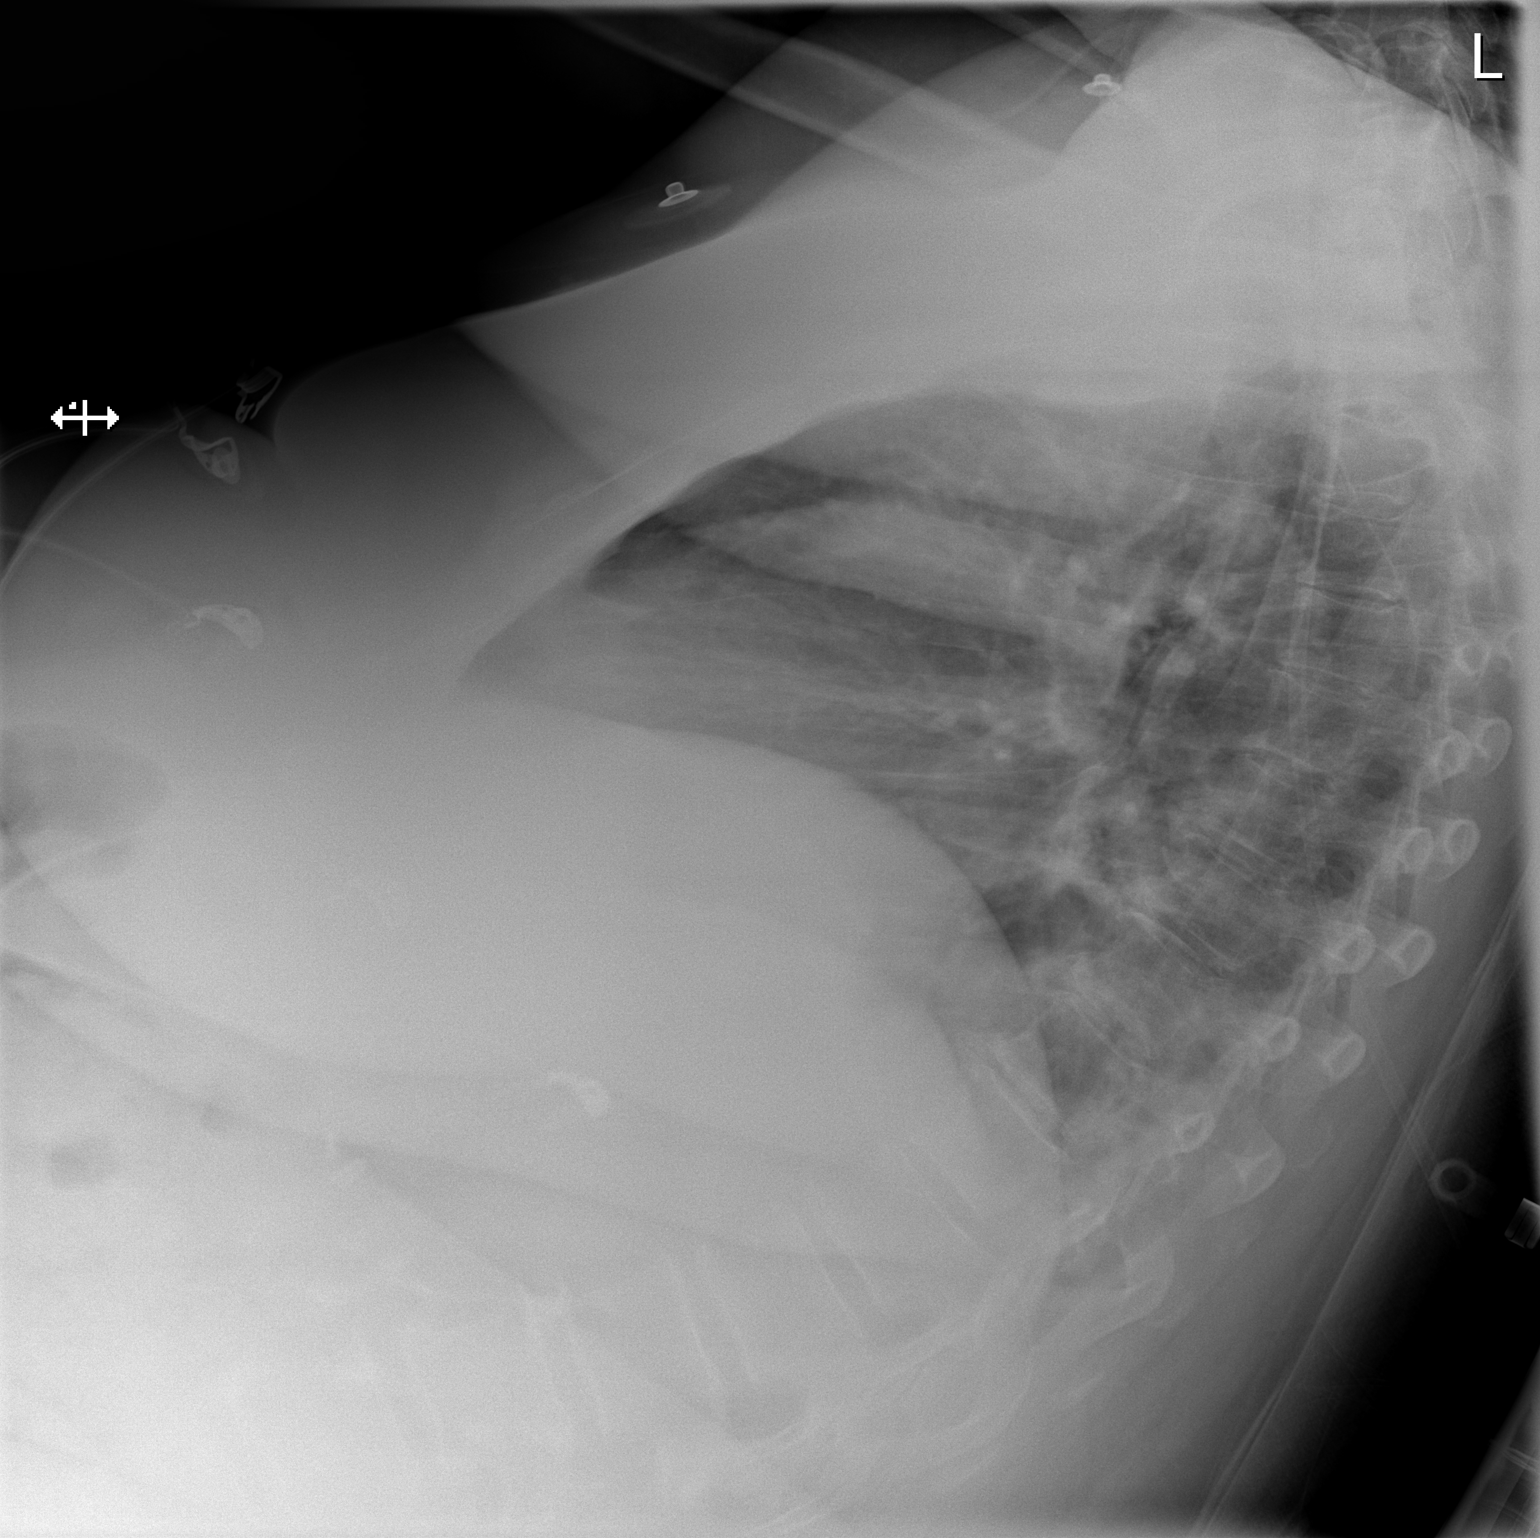

[2 of 2 positions shown; findings below may reference images not displayed]

FINDINGS: The lungs are better expanded and the interstitium has
cleared.  There are no focal pulmonary abnormalities.  The heart
and mediastinal structures are normal.  No effusions or
pneumothoraces.  No acute bony changes.
IMPRESSION: No active disease.

## 2013-09-20 IMAGING — CT CT ANGIO CHEST
2 of 6 series · 19 of 46 positions shown · IV contrast (APPLIED)
Comparison: Chest radiograph performed earlier today at [DATE] a.m.,
and CTA of the chest performed 03/28/2012

CLINICAL DATA: Shortness of breath.  Mid chest pain.

CT ANGIOGRAPHY CHEST
TECHNIQUE: Multidetector CT imaging of the chest using the
standard protocol during bolus administration of intravenous
contrast. Multiplanar reconstructed images including MIPs were
obtained and reviewed to evaluate the vascular anatomy.
Contrast: 100mL OMNIPAQUE IOHEXOL 350 MG/ML SOLN

[Series 6: pulm embolism 1.0 b25f thi · axial · 0.58mm/px · z∈[-256,-85]mm · 16 of 189 slices shown]
[im 9/189  lung]
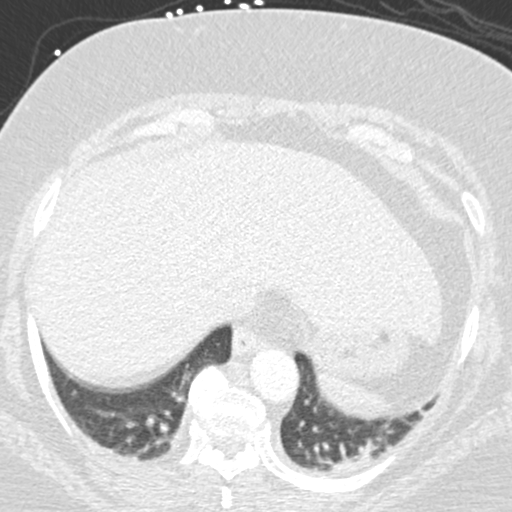
[im 25/189  soft-tissue]
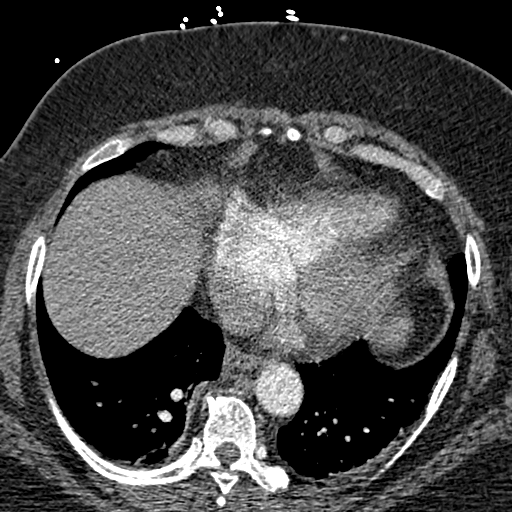
[im 33/189  lung]
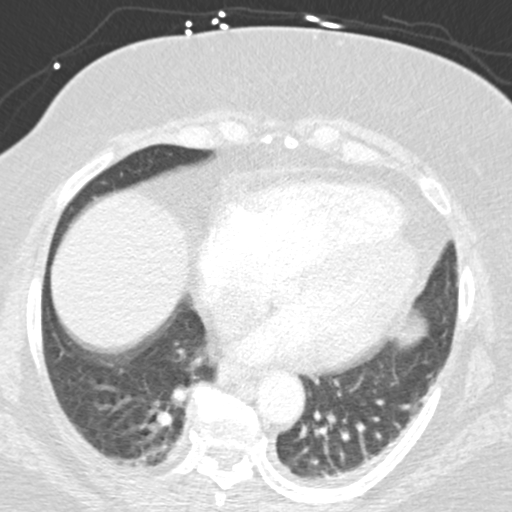
[im 41/189  soft-tissue]
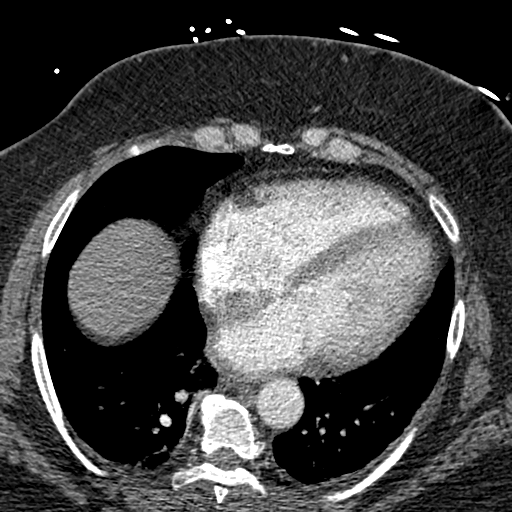
[im 58/189  lung]
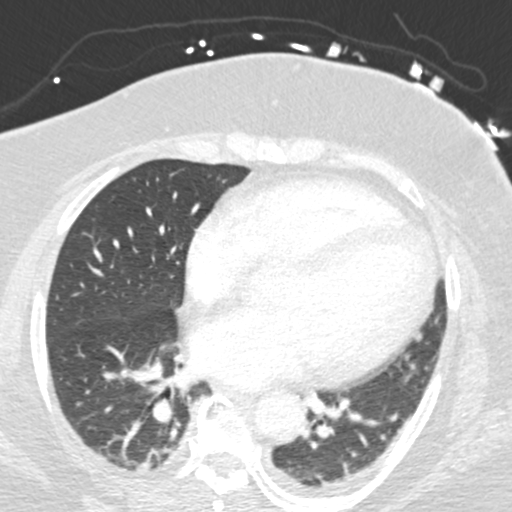
[im 66/189  soft-tissue]
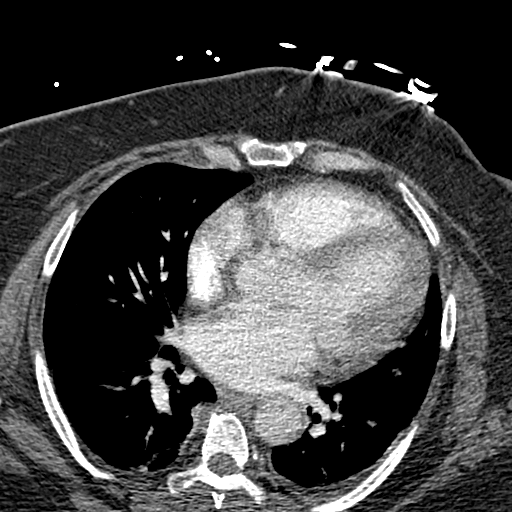
[im 74/189  lung]
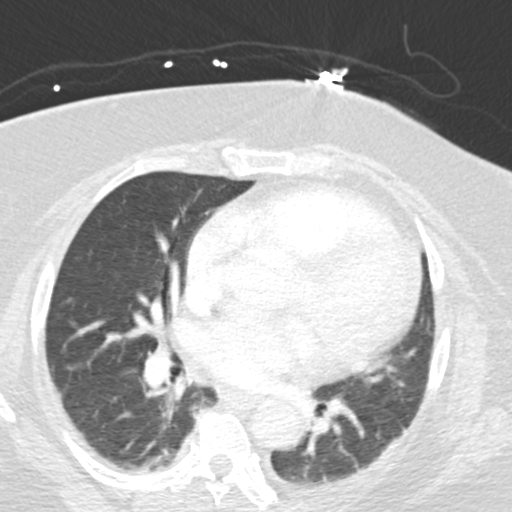
[im 90/189  soft-tissue]
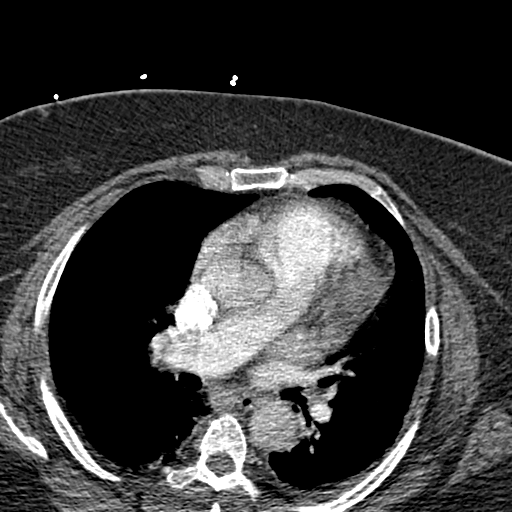
[im 99/189  lung]
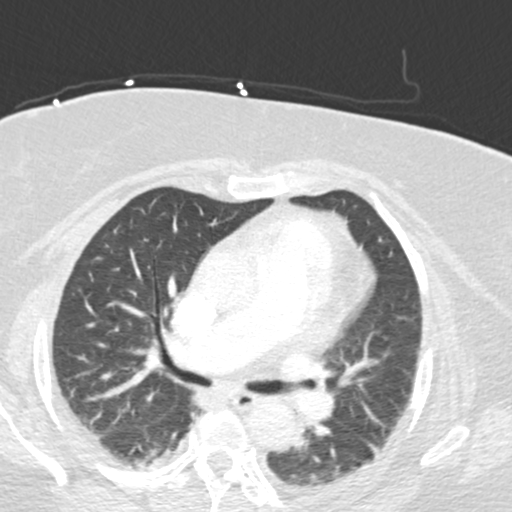
[im 115/189  soft-tissue]
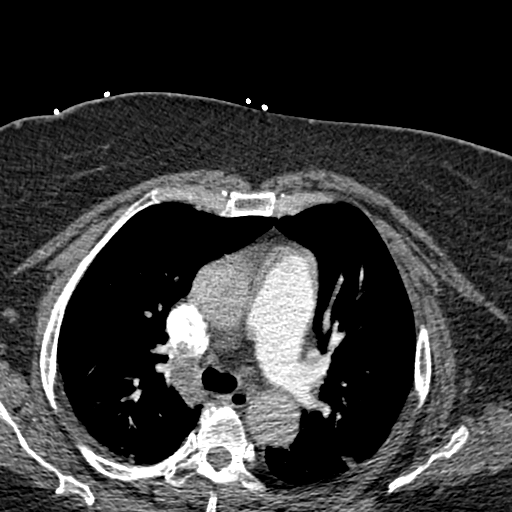
[im 123/189  lung]
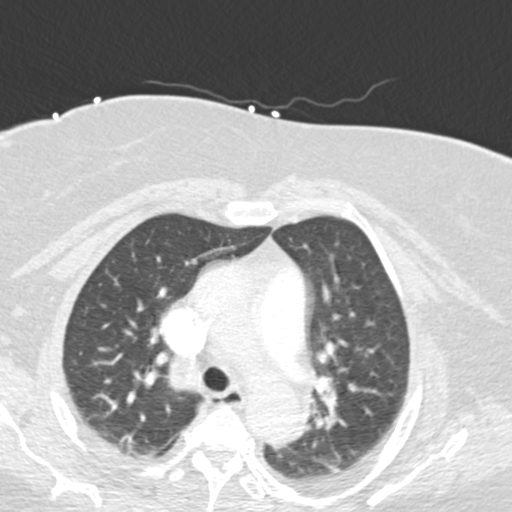
[im 131/189  soft-tissue]
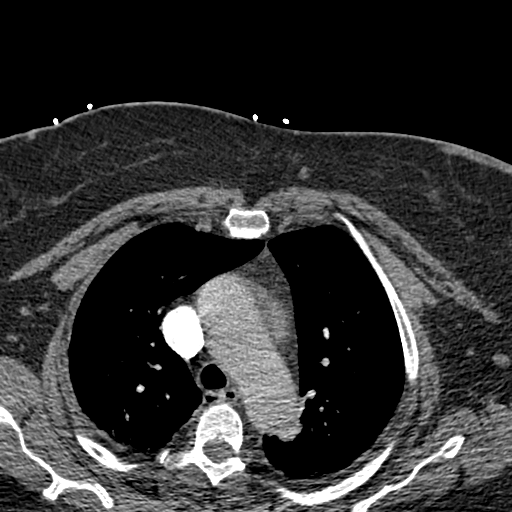
[im 148/189  lung]
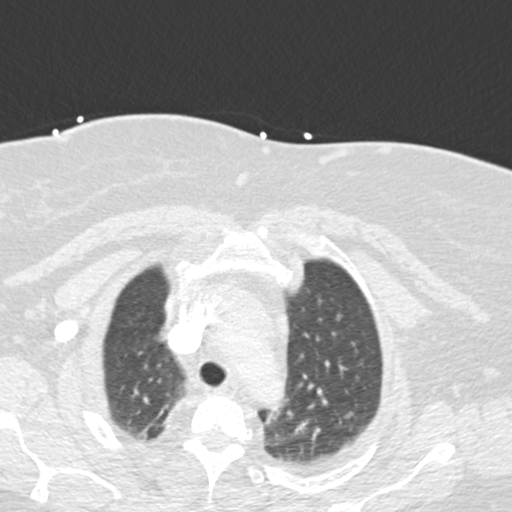
[im 156/189  soft-tissue]
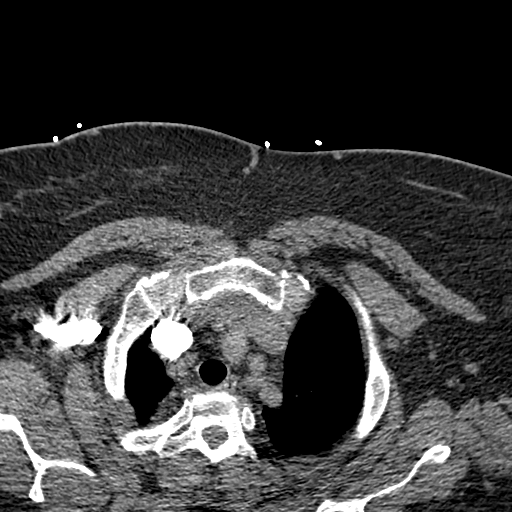
[im 164/189  lung]
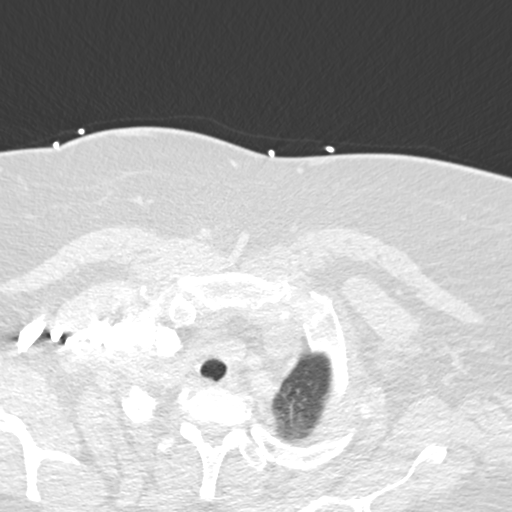
[im 180/189  soft-tissue]
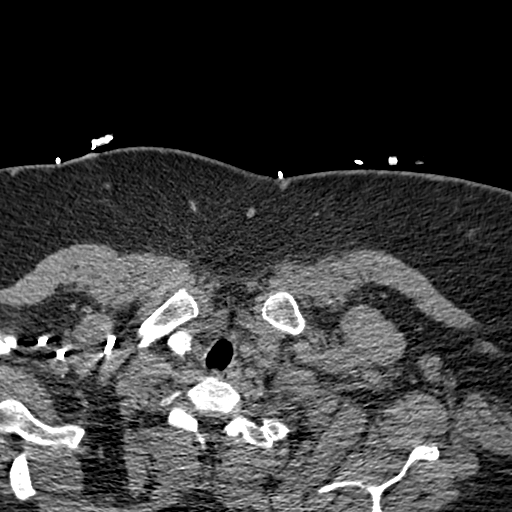

[Series 604: coronal · coronal · 0.58mm/px · 3 of 113 slices shown]
[im 29/113  soft-tissue]
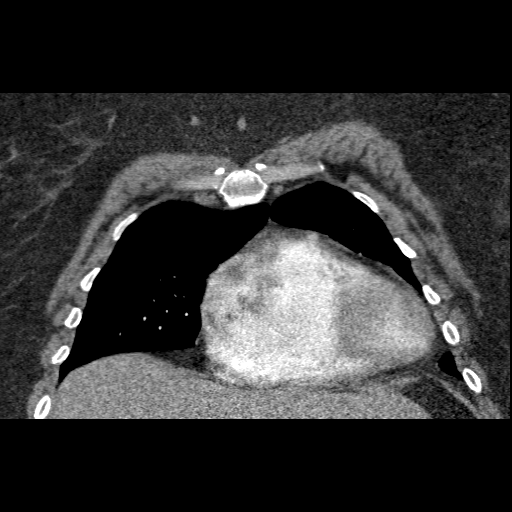
[im 57/113  soft-tissue]
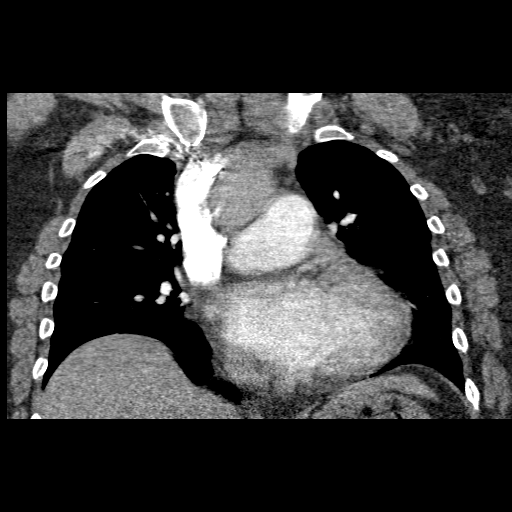
[im 85/113  soft-tissue]
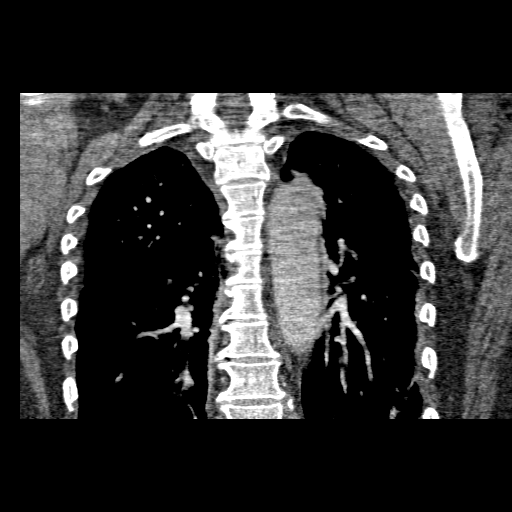

[19 of 46 positions shown; findings below may reference images not displayed]

FINDINGS: There is no evidence of significant pulmonary embolus;
evaluation for pulmonary embolus is mildly suboptimal due to motion
artifact.

Minimal bilateral dependent subsegmental atelectasis is noted.
There is no evidence of significant focal consolidation, pleural
effusion or pneumothorax.  No masses are identified; no abnormal
focal contrast enhancement is seen.

The mediastinum is borderline normal in size.  No mediastinal
lymphadenopathy is seen.  No pericardial effusion is identified.
The great vessels are grossly unremarkable in appearance.  No
axillary lymphadenopathy is seen.  The visualized portions of the
thyroid gland are unremarkable in appearance.

The visualized portions of the liver and spleen are unremarkable.

No acute osseous abnormalities are seen.
IMPRESSION: 1.  No evidence of significant pulmonary embolus.
2.  Minimal bilateral dependent subsegmental atelectasis noted;
lungs otherwise clear.

## 2013-09-25 ENCOUNTER — Non-Acute Institutional Stay (SKILLED_NURSING_FACILITY): Payer: Medicare Other | Admitting: Internal Medicine

## 2013-09-25 ENCOUNTER — Encounter: Payer: Self-pay | Admitting: Internal Medicine

## 2013-09-25 DIAGNOSIS — L02219 Cutaneous abscess of trunk, unspecified: Secondary | ICD-10-CM

## 2013-09-25 DIAGNOSIS — L02212 Cutaneous abscess of back [any part, except buttock]: Secondary | ICD-10-CM

## 2013-09-25 DIAGNOSIS — F209 Schizophrenia, unspecified: Secondary | ICD-10-CM

## 2013-09-25 DIAGNOSIS — L03319 Cellulitis of trunk, unspecified: Secondary | ICD-10-CM

## 2013-09-25 DIAGNOSIS — E119 Type 2 diabetes mellitus without complications: Secondary | ICD-10-CM

## 2013-09-25 NOTE — Progress Notes (Signed)
Patient ID: [name], female   DOB: [birth year], 63 y.o.   MRN: [id number]  Location:  [name] Provider:  Gwenith Spitz. Renato Gails, D.O., C.M.D.  Code Status:  Full code  Chief Complaint  Patient presents with  . Acute Visit    boil on her back, a couple of other new areas, as well;  as refusing to get out of bed for regular baths    HPI:  63 yo female with h/o schizophrenia, morbid obesity, hypothyroidism, DMII, htn, hyperlipidemia, afib seen for acute visit due to concerns about a boil on her back and some other erupting areas.  She has been refusing to get up and get bathed.  The DNS was able to convince her to get up for a shower today so her room could be thoroughly cleaned and bed stripped.    Review of Systems:  Review of Systems  Constitutional: Negative for fever and malaise/fatigue.  HENT: Negative for congestion.   Respiratory: Negative for shortness of breath.   Cardiovascular: Negative for chest pain.  Gastrointestinal: Negative for diarrhea.  Musculoskeletal:       Tenderness over upper back  Skin: Positive for rash. Negative for itching.  Neurological: Negative for weakness.  Psychiatric/Behavioral: Positive for hallucinations.    Medications: Patient's Medications  New Prescriptions   No medications on file  Previous Medications   ALBUTEROL (PROVENTIL HFA;VENTOLIN HFA) 108 (90 BASE) MCG/ACT INHALER    Inhale 2 puffs into the lungs every 6 (six) hours as needed for wheezing.   ASPIRIN EC 81 MG TABLET    Take 81 mg by mouth every morning.    BUDESONIDE (PULMICORT) 0.25 MG/2ML NEBULIZER SOLUTION    Take 0.25 mg by nebulization 2 (two) times daily.   CHLORPROMAZINE (THORAZINE) 100 MG TABLET    Take 100 mg by mouth 2 times daily at 12 noon and 4 pm. AND 200 MG PO Q hs   DIVALPROEX (DEPAKOTE ER) 500 MG 24 HR TABLET    Take 1,000 mg by mouth at bedtime.   LAMOTRIGINE (LAMICTAL) 100 MG TABLET    Take 100 mg by mouth 2 (two) times daily.   LEVOTHYROXINE  (SYNTHROID, LEVOTHROID) 150 MCG TABLET    Take 150 mcg by mouth every morning. For thyroid disease.   LITHIUM 600 MG CAPSULE    Take 600 mg by mouth 2 (two) times daily with a meal.   MULTIPLE VITAMIN (MULTIVITAMIN) TABLET    Take 1 tablet by mouth daily.   NITROGLYCERIN (NITROSTAT) 0.4 MG SL TABLET    Place 0.4 mg under the tongue every 5 (five) minutes as needed for chest pain (may repeat for 2 doses).    OLANZAPINE (ZYPREXA) 15 MG TABLET    Take 30 mg by mouth at bedtime.   OMEPRAZOLE (PRILOSEC) 20 MG CAPSULE    Take 40 mg by mouth every morning.    ONDANSETRON (ZOFRAN) 4 MG TABLET    Take 4 mg by mouth every 8 (eight) hours as needed for nausea or vomiting.   POLYETHYLENE GLYCOL (MIRALAX / GLYCOLAX) PACKET    Take 17 g by mouth 2 (two) times a week. Tuesday and Friday  Modified Medications   No medications on file  Discontinued Medications   No medications on file    Physical Exam: Filed Vitals:   09/25/13 1652  BP: 139/94  Pulse: 81  Temp: 97.7 F (36.5 C)  Resp: 22  Height: 5\' 2"  (1.575 m)  SpO2: 98%   Physical  Exam  Constitutional:  Morbidly obese female  HENT:  Head: Normocephalic and atraumatic.  Cardiovascular:  irreg irreg  Pulmonary/Chest: Effort normal and breath sounds normal. No respiratory distress. She has no rales.  Abdominal: Bowel sounds are normal.  Neurological: She is alert.  Skin:  Quarter-sized open abscess of cervical region with brown, foul-smelling drainage present, is tender to touch with surrounding erythema, warmth and fluctuance;  Also has 2 pencil eraser sized pustules of left shoulder     Labs reviewed: Basic Metabolic Panel:  Recent Labs  09/81/1909/10/01 1600 04/24/13 0805 05/20/13 1840  NA 140 137 134*  K 4.2 3.8 3.9  CL 102 103 98  CO2 31 29 28   GLUCOSE 83 91 102*  BUN 11 12 7   CREATININE 0.56 0.50 0.56  CALCIUM 9.6 9.2 10.2    Liver Function Tests:  Recent Labs  04/23/13 1600 04/24/13 0805 05/20/13 1840  AST 17 11 11     ALT 7 6 6   ALKPHOS 63 61 71  BILITOT 0.3 0.5 0.2*  PROT 6.8 6.2 7.4  ALBUMIN 2.8* 2.6* 3.3*    CBC:  Recent Labs  02/21/13 1945  04/23/13 1600 04/24/13 0805 04/25/13 0510 05/20/13 1840  WBC 11.6*  < > 6.5 6.6 5.3 7.4  NEUTROABS 8.6*  --  3.4 3.6  --   --   HGB 13.2  < > 12.8 12.2 12.0 11.9*  HCT 40.0  < > 38.0 36.1 35.1* 36.2  MCV 100.0  < > 98.7 100.0 99.4 100.3*  PLT 275  < > 208 192 194 236  < > = values in this interval not displayed. Lab Results  Component Value Date   HGBA1C 5.4 07/29/2012   Lab Results  Component Value Date   CHOL 159 05/31/2012   HDL 52 05/31/2012   LDLCALC 73 05/31/2012   TRIG 168* 05/31/2012   CHOLHDL 3.1 05/31/2012    07/24/13:  CXR:  Poor inspiration secondary to body habitus, minimal cardiomegaly with mild to moderate pulmonary vascular congestion, small left pleural effusion, patchy pneumonitis or edema at mid and lower lungs bilaterally 07/25/13:  Wbc 8, h/h 11.5, hct 35.1, plts 222, glucose 79, Na 143, K 4.8, cr 0.68, BUN 12, BNP 20.90 09/08/13: lithium 0.74  Assessment/Plan 1. Abscess of back (mid back in cervical region) -check cbc with diff, bmp -is already open and draining foul smelling brownish drainage -will start on doxycycline 100mg  po bid for 10 days -also has a couple of clustered pustules on her left shoulder -also florastor 250mg  po bid for 14 days -treatment nurse will monitor appearance and let us know if there is no improvement or worsening of the abscess  2. Schizophrenia -stable at this time, but does often refuse to get up out of bed for bathing which probably helped to precipitate development of this infection -cont lithium (last level normal), thorazine, depakote (check level) -followed by [facility] here  3. Morbid obesity -no recent weight -requires several to assist with turning and her ADL care -using bariatric bed and mattress  4. Diabetes mellitus -last hba1c was excellent  Family/ staff Communication:  discussed with DNS  Goals of care: full code, long term care resident due to inability to perform self care (schizophrenia, intellectual disability and morbid obesity)  Labs/tests ordered:  Cbc with diff, bmp, flp next draw

## 2013-09-27 IMAGING — CR DG CHEST 1V PORT
1 series · 1 of 1 positions shown · non-contrast
Comparison: 05/23/2012

CLINICAL DATA: Pain

PORTABLE CHEST - 1 VIEW

[AP]
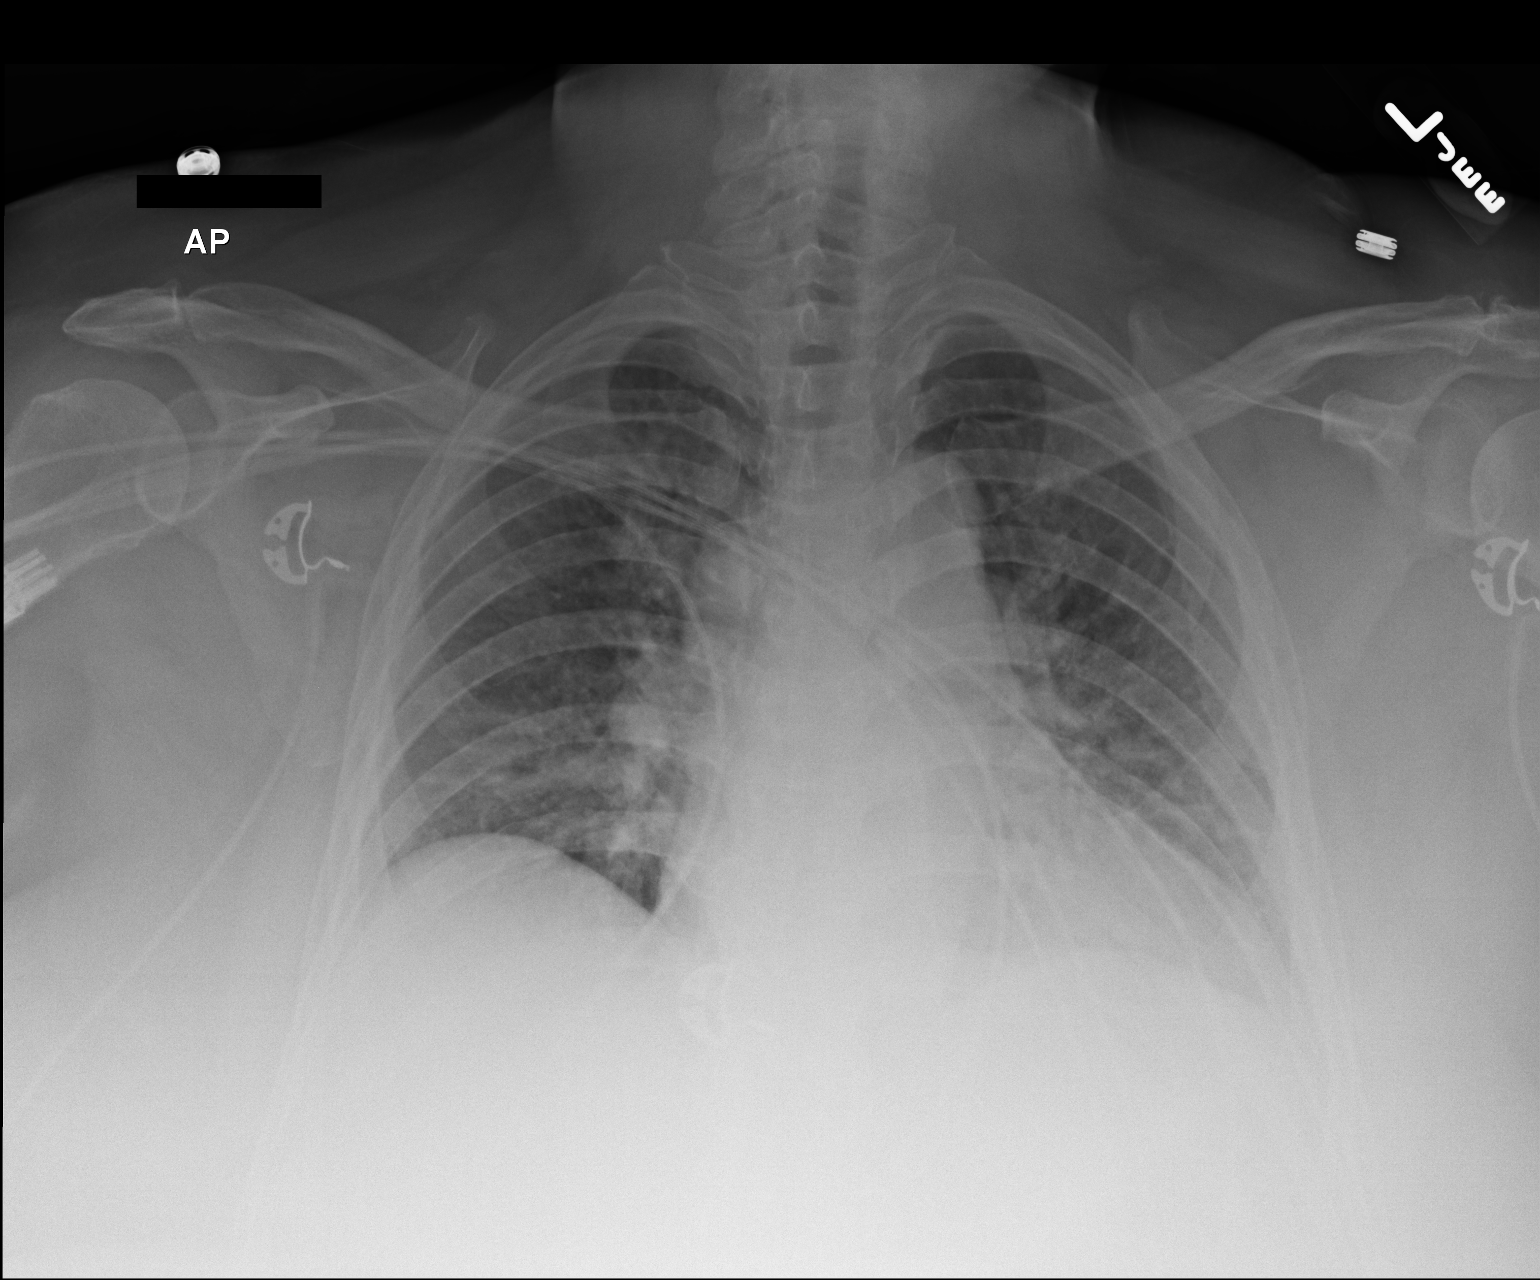

[1 of 1 positions shown; findings below may reference images not displayed]

FINDINGS: Hypoventilation with decreased lung volume and crowded
markings in the bases compatible with atelectasis.

Cardiac enlargement with vascular congestion.  No edema or
effusion.
IMPRESSION: Hypoventilation with mild bibasilar atelectasis.  Pulmonary
vascular congestion without edema.

## 2013-10-08 ENCOUNTER — Non-Acute Institutional Stay (SKILLED_NURSING_FACILITY): Payer: Medicare Other | Admitting: Internal Medicine

## 2013-10-08 DIAGNOSIS — R21 Rash and other nonspecific skin eruption: Secondary | ICD-10-CM

## 2013-10-13 ENCOUNTER — Encounter: Payer: Self-pay | Admitting: Internal Medicine

## 2013-10-13 NOTE — Progress Notes (Signed)
MRN: [id number] Name: [name]  Sex: female Age: 63 y.o. DOB: 08/13/1950  [facility] Facility[facility]Room: 111A Level Of Care: SNF Provider: Margit HanksAnne D Ronny Ruddell Emergency Contacts: Extended Emergency Contact Information Primary Emergency Contact: Maxwell,Clarance Address: [address]          [place], [place] [id number] [place] Mobile Phone: [phone] Relation: Friend Secondary Emergency Contact: Virl SonSummers,Roxy          [place], [place] Mobile Phone: [phone] Relation: Friend   Allergies: Food; Penicillins; and Sulfamethoxazole-trimethoprim  Chief Complaint  Patient presents with  . Medical Management of Chronic Issues    HPI: Patient is 63 y.o. female who nursing has asked me to see because her back is breaking out again. It had been healing with wound care.  Past Medical History  Diagnosis Date  . Schizoaffective disorder   . Urinary tract infection   . Hypertension   . GERD (gastroesophageal reflux disease)   . Hypothyroidism   . Obesity   . Asthma   . COPD (chronic obstructive pulmonary disease)   . CHF (congestive heart failure)   . Seizures   . Morbid obesity   . Chronic pain   . Hypercholesteremia   . Anginal pain   . Myocardial infarction 2002  . Diabetes mellitus     "I'm not diabetic anymore" (05/23/2012)  . Anemia   . History of blood transfusion 1993    "when I had my hysterectomy" (05/23/2012)  . H/O hiatal hernia   . Daily headache   . Arthritis     "severe; all over my body" (05/23/2012)  . Hypoventilation syndrome     [name]/notes 05/30/2012  . Atrial fibrillation   . Atrial flutter, paroxysmal     [name]/notes 05/30/2012  . Exertional dyspnea   . Shortness of breath     "all the time lately" (05/30/2012)  . Pneumonia     "several times; including now" (04/23/2013)    Past Surgical History  Procedure Laterality Date  . Tubal ligation  1998  . Cholecystectomy  ?2008  . Cardiac  catheterization  2008  . Vaginal hysterectomy  1993      Medication List       This list is accurate as of: 10/08/13 11:59 PM.  Always use your most recent med list.               albuterol 108 (90 BASE) MCG/ACT inhaler  Commonly known as:  PROVENTIL HFA;VENTOLIN HFA  Inhale 2 puffs into the lungs every 6 (six) hours as needed for wheezing.     aspirin EC 81 MG tablet  Take 81 mg by mouth every morning.     budesonide 0.25 MG/2ML nebulizer solution  Commonly known as:  PULMICORT  Take 0.25 mg by nebulization 2 (two) times daily.     chlorproMAZINE 100 MG tablet  Commonly known as:  THORAZINE  Take 100 mg by mouth 2 times daily at 12 noon and 4 pm. AND 200 MG PO Q hs     divalproex 500 MG 24 hr tablet  Commonly known as:  DEPAKOTE ER  Take 1,000 mg by mouth at bedtime.     lamoTRIgine 100 MG tablet  Commonly known as:  LAMICTAL  Take 100 mg by mouth 2 (two) times daily.     levothyroxine 150 MCG tablet  Commonly known as:  SYNTHROID, LEVOTHROID  Take 150 mcg by mouth every morning. For thyroid disease.  lithium 600 MG capsule  Take 600 mg by mouth 2 (two) times daily with a meal.     multivitamin tablet  Take 1 tablet by mouth daily.     nitroGLYCERIN 0.4 MG SL tablet  Commonly known as:  NITROSTAT  Place 0.4 mg under the tongue every 5 (five) minutes as needed for chest pain (may repeat for 2 doses).     OLANZapine 15 MG tablet  Commonly known as:  ZYPREXA  Take 30 mg by mouth at bedtime.     omeprazole 20 MG capsule  Commonly known as:  PRILOSEC  Take 40 mg by mouth every morning.     ondansetron 4 MG tablet  Commonly known as:  ZOFRAN  Take 4 mg by mouth every 8 (eight) hours as needed for nausea or vomiting.     polyethylene glycol packet  Commonly known as:  MIRALAX / GLYCOLAX  Take 17 g by mouth 2 (two) times a week. Tuesday and Friday        No orders of the defined types were placed in this encounter.    Immunization History   Administered Date(s) Administered  . Influenza Split 03/29/2012  . Influenza Whole 05/27/2009, 02/18/2010  . Influenza,inj,Quad PF,36+ Mos 04/24/2013  . Pneumococcal Polysaccharide-23 05/20/2009, 03/29/2012  . Td 05/27/2009    History  Substance Use Topics  . Smoking status: Former Smoker -- 2.00 packs/day for 12 years    Types: Cigarettes    Quit date: 06/29/2000  . Smokeless tobacco: Current User    Types: Chew  . Alcohol Use: No    Review of Systems  DATA OBTAINED: from patient, nurse GENERAL: Feels well no fevers, fatigue, appetite changes SKIN: no c/o HEENT: No complaint RESPIRATORY: No cough, wheezing, SOB CARDIAC: No chest pain, palpitations, lower extremity edema  GI: No abdominal pain, No N/V/D or constipation, No heartburn or reflux  GU: No dysuria, frequency or urgency, or incontinence  MUSCULOSKELETAL: No unrelieved bone/joint pain NEUROLOGIC: No headache, dizziness or focal weakness PSYCHIATRIC: No overt anxiety or sadness. Sleeps well.   Filed Vitals:   10/13/13 1741  BP: 125/89  Pulse: 78  Temp: 97.5 F (36.4 C)  Resp: 20    Physical Exam  GENERAL APPEARANCE: Alert, conversant. Appropriately groomed. No acute distress  SKIN: upper back is covered with guaze dressing; underneath are healing very shallow ulcer like lesions about 5; surrounding is redness, no heat in the straight line and straight corners of tape or guaze HEENT: Unremarkable RESPIRATORY: Breathing is even, unlabored. Lung sounds are clear   CARDIOVASCULAR: Heart RRR no murmurs, rubs or gallops. No peripheral edema  GASTROINTESTINAL: Abdomen is soft, non-tender, not distended w/ normal bowel sounds.  GENITOURINARY: Bladder non tender, not distended  MUSCULOSKELETAL: No abnormal joints or musculature NEUROLOGIC: Cranial nerves 2-12 grossly intact. Moves upper extremities no tremor. PSYCHIATRIC: very pleasant, mild MR, no behavioral issues  Pt is quite large. With the help of a nurse  was able to get turned so I can see her back properly.  Patient Active Problem List   Diagnosis Date Noted  . Schizophrenia 07/05/2013  . Psychosis 07/05/2013  . Urinary tract infection, site not specified 04/29/2013  . Schizo-affective psychosis 04/19/2013  . COPD (chronic obstructive pulmonary disease) 04/19/2013  . Dyspnea and respiratory abnormality 04/19/2013  . Constipation 12/11/2012  . Hypercapnic respiratory failure, chronic 09/02/2012  . Altered mental status 07/29/2012  . Tachycardia 03/30/2012  . Hypertension 03/28/2012  . Atrial fibrillation 03/28/2012  . Hypothyroidism 03/28/2012  .  Diabetes mellitus 03/28/2012  . Hypotension 03/28/2012  . Acute respiratory failure with hypoxia 03/28/2012  . Morbid obesity   . Chronic pain   . Nausea 01/26/2012  . Diarrhea 01/26/2012  . Chest pain 01/11/2012  . Hyponatremia 01/11/2012  . Healthcare-associated pneumonia 01/11/2012  . Community acquired pneumonia 07/29/2011  . Suicidal ideations 07/29/2011  . Schizoaffective disorder, bipolar type 06/20/2011  . ANEMIA 07/13/2010  . CHEST PAIN 07/13/2010  . OBSTRUCTIVE SLEEP APNEA 01/22/2010  . CHOLELITHIASIS 11/30/2009  . MYOCARDIAL INFARCTION 11/17/2009  . ASTHMA 11/17/2009  . INSOMNIA 11/17/2009  . DEGENERATIVE JOINT DISEASE, KNEES, BILATERAL 05/27/2009  . URINALYSIS, ABNORMAL 05/27/2009  . OBESITY 03/26/2009  . DYSPNEA ON EXERTION 08/12/2008  . LOW BACK PAIN SYNDROME 12/11/2007  . HYPOTHYROIDISM NOS 02/06/2007  . DEFICIENCY, B-COMPLEX NEC 02/06/2007  . RETARDATION, MENTAL, MILD 02/06/2007  . HYPERTENSION, BENIGN ESSENTIAL 02/06/2007  . GERD 02/06/2007  . CHEST PAIN, HX OF 02/21/2006  . TARDIVE DYSKINESIA 08/10/2004    CBC    Component Value Date/Time   WBC 7.4 05/20/2013 1840   RBC 3.61* 05/20/2013 1840   RBC 2.70* 05/19/2011 0632   HGB 11.9* 05/20/2013 1840   HCT 36.2 05/20/2013 1840   PLT 236 05/20/2013 1840   MCV 100.3* 05/20/2013 1840   LYMPHSABS 2.2 04/24/2013  0805   MONOABS 0.6 04/24/2013 0805   EOSABS 0.1 04/24/2013 0805   BASOSABS 0.0 04/24/2013 0805    CMP     Component Value Date/Time   NA 134* 05/20/2013 1840   K 3.9 05/20/2013 1840   CL 98 05/20/2013 1840   CO2 28 05/20/2013 1840   GLUCOSE 102* 05/20/2013 1840   BUN 7 05/20/2013 1840   CREATININE 0.56 05/20/2013 1840   CALCIUM 10.2 05/20/2013 1840   PROT 7.4 05/20/2013 1840   ALBUMIN 3.3* 05/20/2013 1840   AST 11 05/20/2013 1840   ALT 6 05/20/2013 1840   ALKPHOS 71 05/20/2013 1840   BILITOT 0.2* 05/20/2013 1840   GFRNONAA >90 05/20/2013 1840   GFRAA >90 05/20/2013 1840    Assessment and Plan  RASH ON BACK- does not have a bacterial breakout. Appears to be allergic reaction to most likely tape. Since this is not bothering the pt I would not use steroid cream. Will monitor tapes and dressings used.  Margit Hanks, MD

## 2013-10-14 IMAGING — CR DG CHEST 1V PORT
1 series · 1 of 1 positions shown · non-contrast
Comparison: Chest radiograph 05/30/2012, CT thorax 05/23/2012

CLINICAL DATA: Chest pain, hematemesis

PORTABLE CHEST - 1 VIEW

[AP]
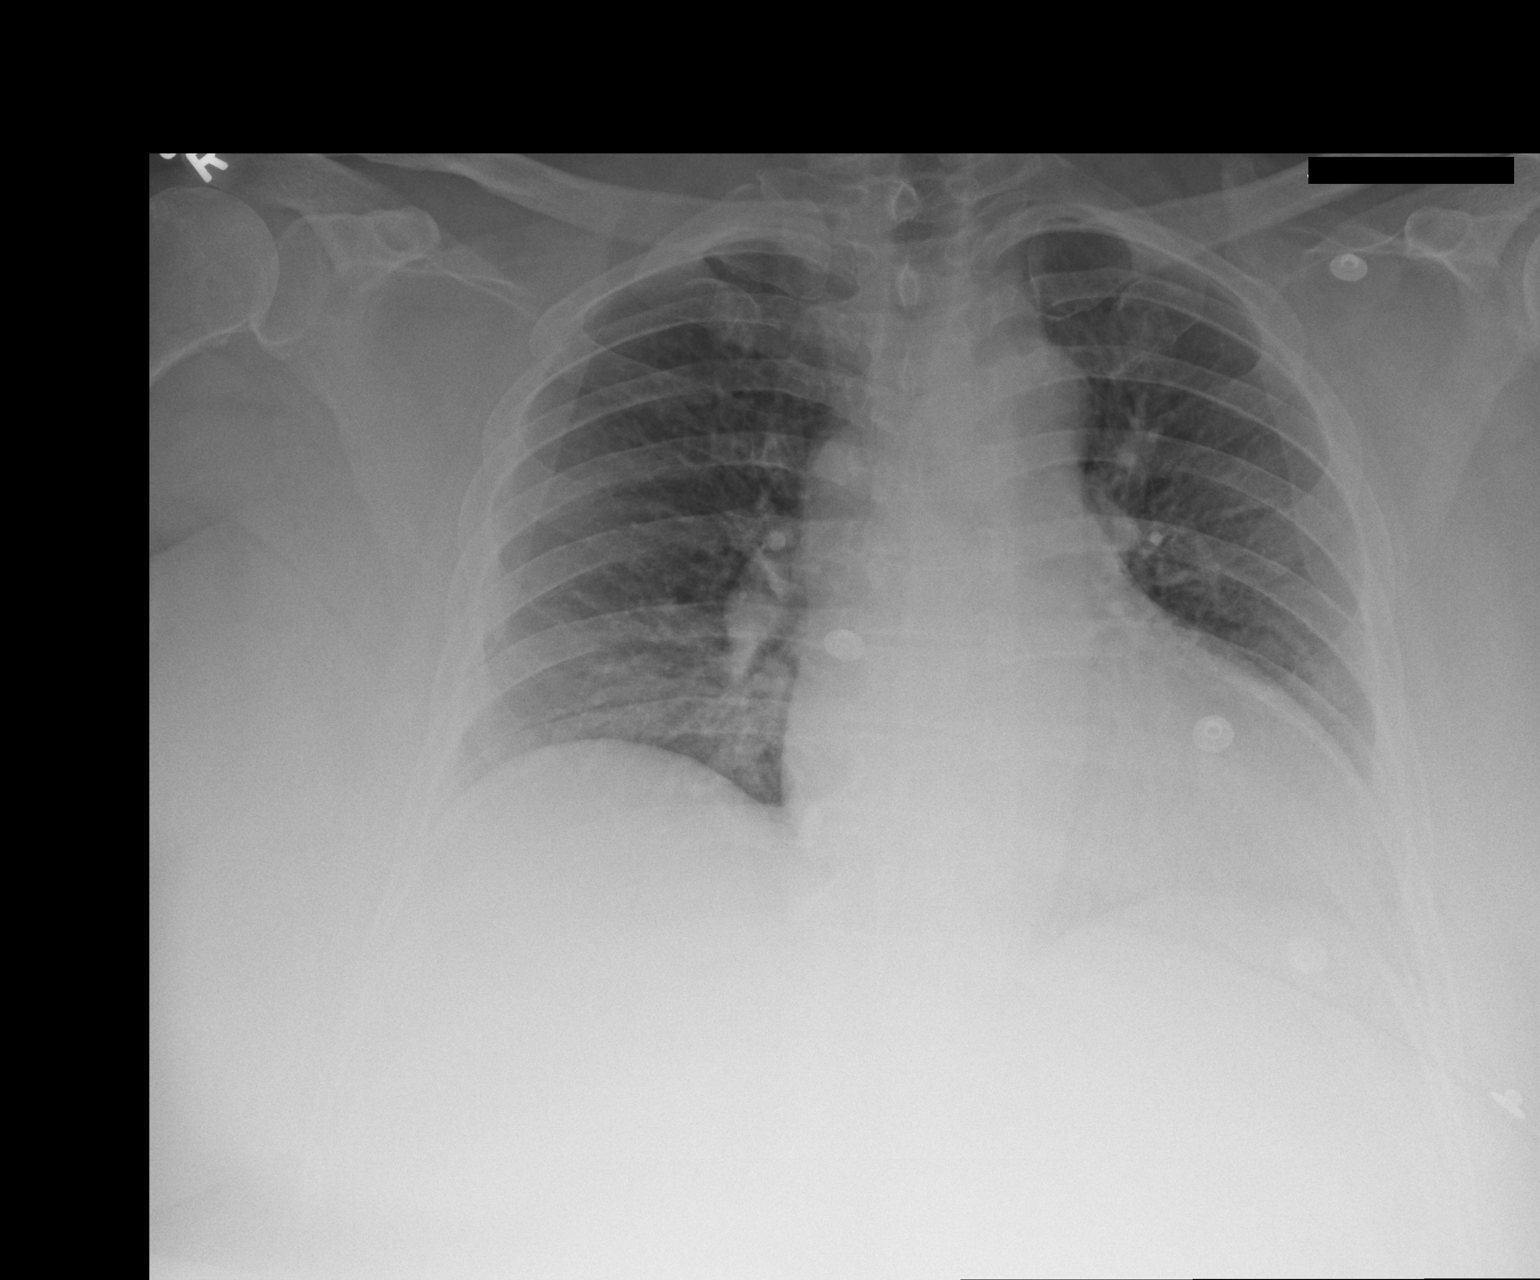

[1 of 1 positions shown; findings below may reference images not displayed]

FINDINGS: Stable mildly enlarged heart silhouette.  There are low
lung volumes.  No effusion, infiltrate, or pneumothorax.
Degenerative osteophytosis of the thoracic spine.
IMPRESSION: No acute cardiopulmonary process.

## 2013-10-14 IMAGING — CT CT ANGIO CHEST
2 of 6 series · 19 of 36 positions shown · IV contrast (APPLIED)
Comparison: 05/23/2012

CLINICAL DATA: Pain and shortness of breath.

CT ANGIOGRAPHY CHEST
TECHNIQUE: Multidetector CT imaging of the chest using the
standard protocol during bolus administration of intravenous
contrast. Multiplanar reconstructed images including MIPs were
obtained and reviewed to evaluate the vascular anatomy.
Contrast: 100mL OMNIPAQUE IOHEXOL 350 MG/ML SOLN

[Series 6: pulm embolism 1.0 b25f thins · axial · 0.69mm/px · z∈[-270,-66]mm · 18 of 227 slices shown]
[im 12/227  lung]
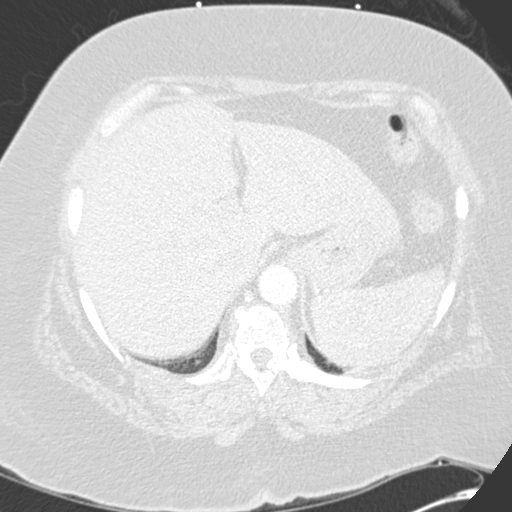
[im 23/227  mediastinal]
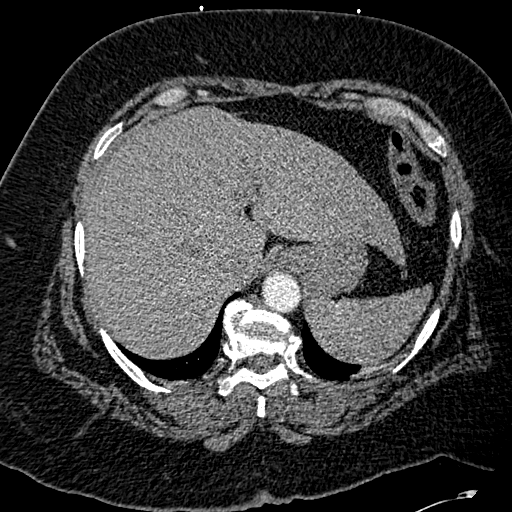
[im 34/227  lung]
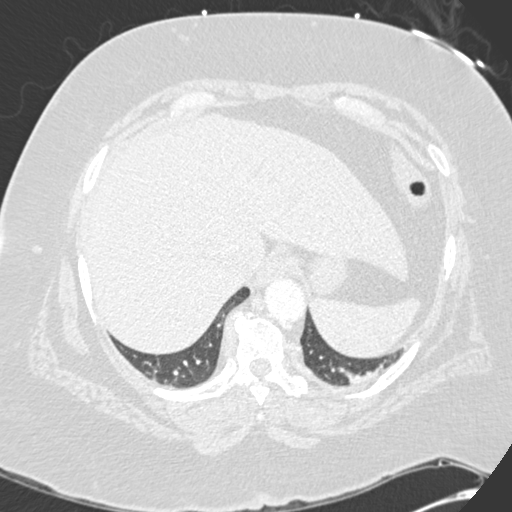
[im 46/227  mediastinal]
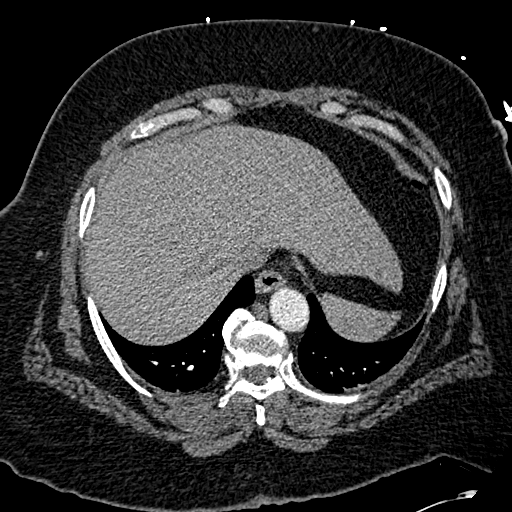
[im 57/227  lung]
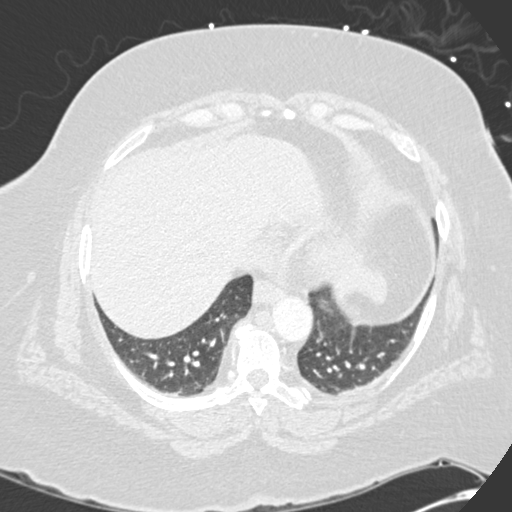
[im 68/227  mediastinal]
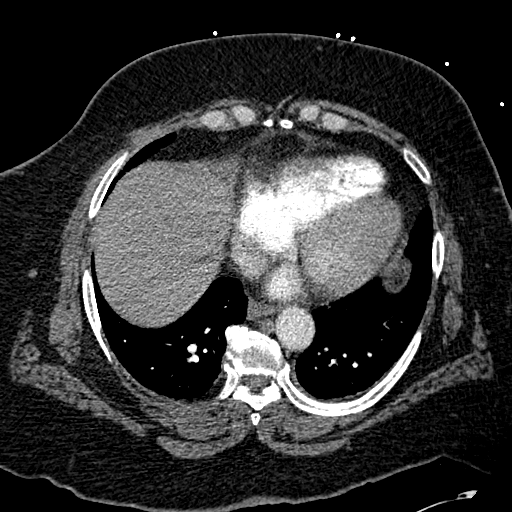
[im 80/227  lung]
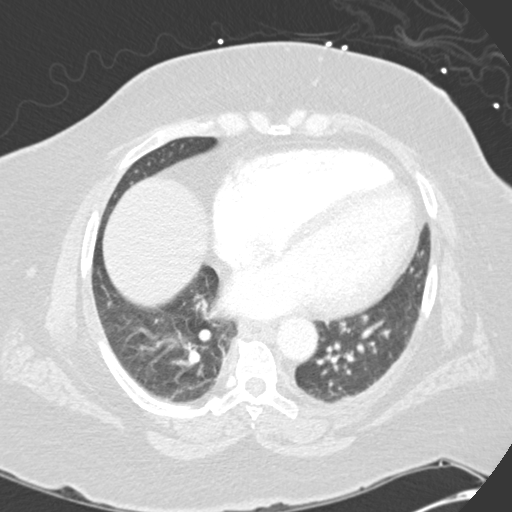
[im 91/227  mediastinal]
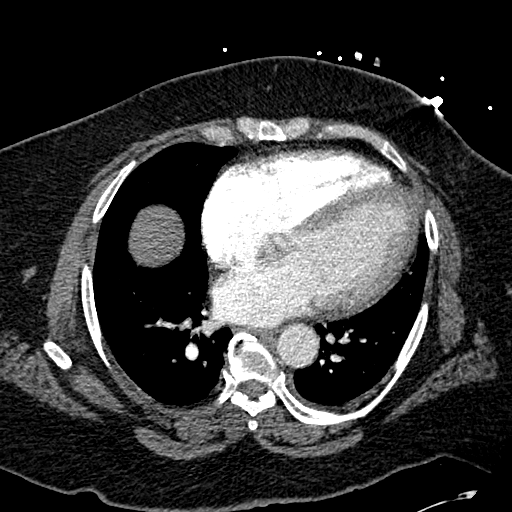
[im 102/227  lung]
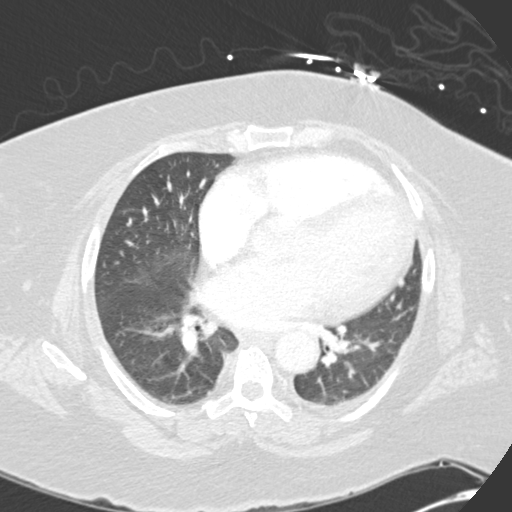
[im 125/227  mediastinal]
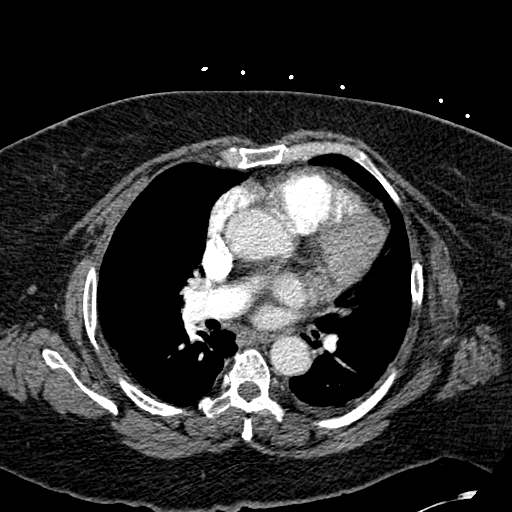
[im 136/227  lung]
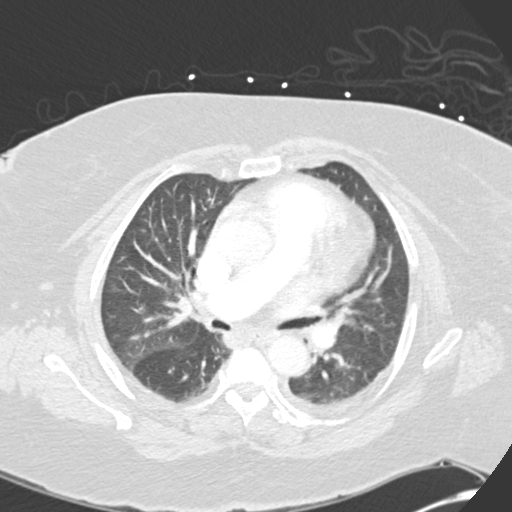
[im 147/227  mediastinal]
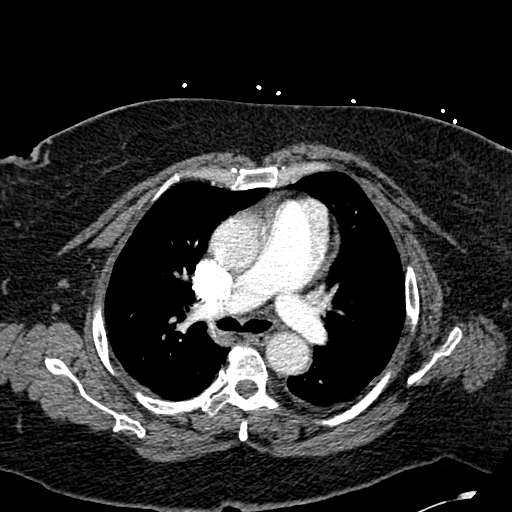
[im 159/227  lung]
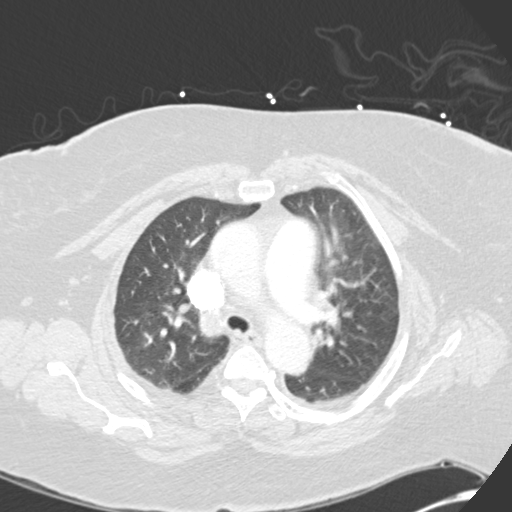
[im 170/227  mediastinal]
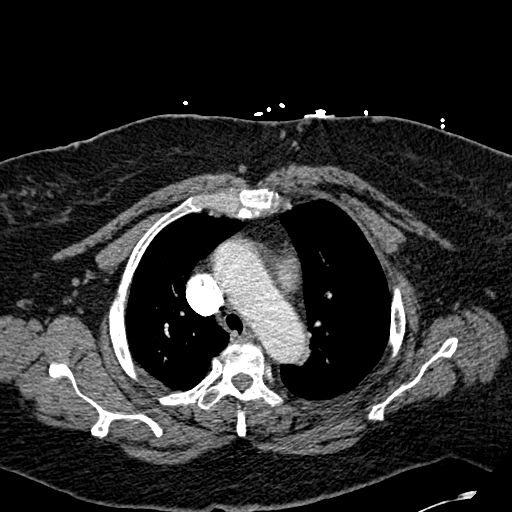
[im 181/227  lung]
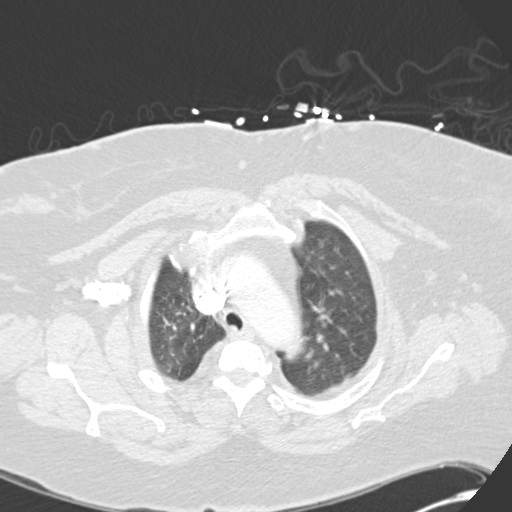
[im 193/227  mediastinal]
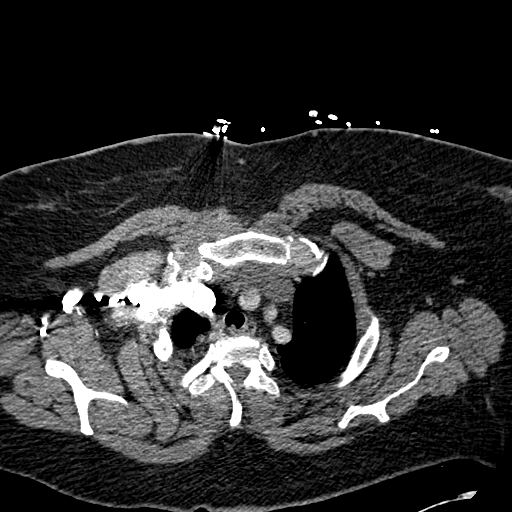
[im 204/227  lung]
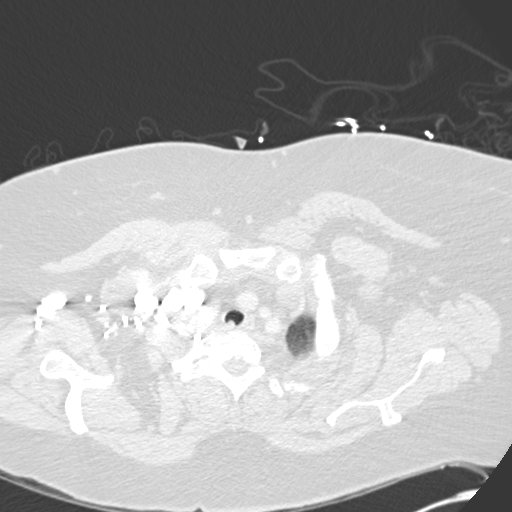
[im 215/227  mediastinal]
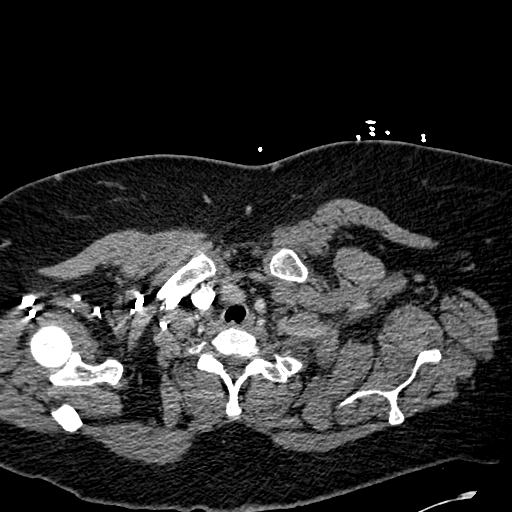

[Series 605: cor mpr · coronal · 0.69mm/px · 1 of 133 slices shown]
[im 67/133  mediastinal]
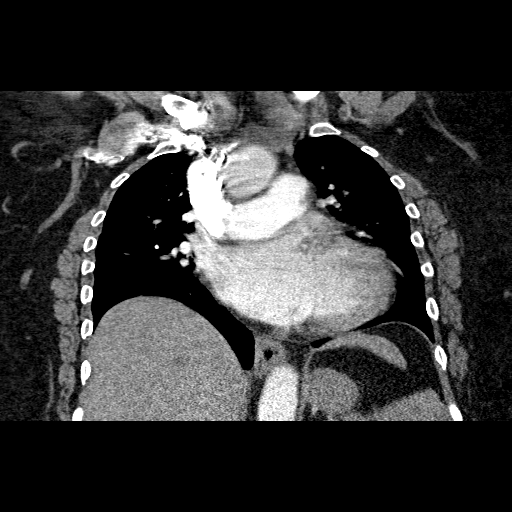

[19 of 36 positions shown; findings below may reference images not displayed]

FINDINGS: The study is degraded by patient breathing motion during
image acquisition and body habitus.  There is no large central
pulmonary embolus in the main or lobar pulmonary arteries.
Although no filling defect can be seen in the segmental or
subsegmental pulmonary arteries, these may not be reliably
evaluated on this study given the prominent motion degradation.. No
thoracic aortic aneurysm.  There is no dissection of the thoracic
aorta

No axillary, mediastinal, or hilar lymphadenopathy.  The heart is
enlarged.  There is no pericardial or pleural effusion.

Lung windows shows some compressive atelectasis in the lower lobes.
No dense focal airspace consolidation.

Bone windows reveal no worrisome lytic or sclerotic osseous
lesions.
IMPRESSION: No CT evidence for acute pulmonary embolus although assessment
segmental and subsegmental pulmonary arteries may not be reliable,
as described above.

Dependent atelectasis in the lung bases without evidence for dense
focal airspace consolidation.

## 2013-10-15 ENCOUNTER — Non-Acute Institutional Stay (SKILLED_NURSING_FACILITY): Payer: Medicare Other | Admitting: Internal Medicine

## 2013-10-15 ENCOUNTER — Encounter: Payer: Self-pay | Admitting: Internal Medicine

## 2013-10-15 DIAGNOSIS — F70 Mild intellectual disabilities: Secondary | ICD-10-CM

## 2013-10-15 DIAGNOSIS — R21 Rash and other nonspecific skin eruption: Secondary | ICD-10-CM

## 2013-10-15 DIAGNOSIS — E669 Obesity, unspecified: Secondary | ICD-10-CM

## 2013-10-15 NOTE — Assessment & Plan Note (Signed)
Pt's back today looks worse than it did 3 days ago, but not horrible, small pus. The culture from the back grew out > 100,000Klebsiella and > 100,000 Psuedomonas. Pt was started on Cipro yesterday per phone trying to work with a nurse who didn't really understand what she was looking at. Today I see that the Pseudomonas is well covered with cipro for 2 weeks, but the klebsiella will need ertapenum 1 gm IV daily for a week based on MIC's.

## 2013-10-15 NOTE — Progress Notes (Signed)
MRN: [id number] Name: [name]  Sex: female Age: 63 y.o. DOB: Dec 17, 1950  PSC #: Ronni Rumble Facility/Room: 111A Level Of Care: SNF Provider: Margit Hanks Emergency Contacts: Extended Emergency Contact Information Primary Emergency Contact: Maxwell,Clarance Address: [address]          [place], [place] [address] [place] Phone: [phone] Relation: Friend Secondary Emergency Contact: Virl Son, [place] Mobile Phone: [phone] Relation: Friend  Code Status:FULL   Allergies: Food; Penicillins; and Sulfamethoxazole-trimethoprim  Chief Complaint  Patient presents with  . Medical Management of Chronic Issues    HPI: Patient is 63 y.o. female who has had a rash on her back for about several weeks and a cx and MIC just came back yesterday. It's possible that pt may need IV antibiotics so I need to examine her again to see if back looks as bad as the cx does. It did not look that bad 3 days ago.  Past Medical History  Diagnosis Date  . Schizoaffective disorder   . Urinary tract infection   . Hypertension   . GERD (gastroesophageal reflux disease)   . Hypothyroidism   . Obesity   . Asthma   . COPD (chronic obstructive pulmonary disease)   . CHF (congestive heart failure)   . Seizures   . Morbid obesity   . Chronic pain   . Hypercholesteremia   . Anginal pain   . Myocardial infarction 2002  . Diabetes mellitus     "I'm not diabetic anymore" (05/23/2012)  . Anemia   . History of blood transfusion 1993    "when I had my hysterectomy" (05/23/2012)  . H/O hiatal hernia   . Daily headache   . Arthritis     "severe; all over my body" (05/23/2012)  . Hypoventilation syndrome     [name] 05/30/2012  . Atrial fibrillation   . Atrial flutter, paroxysmal     [name] 05/30/2012  . Exertional dyspnea   . Shortness of breath     "all the time lately" (05/30/2012)  . Pneumonia     "several  times; including now" (04/23/2013)    Past Surgical History  Procedure Laterality Date  . Tubal ligation  1998  . Cholecystectomy  ?2008  . Cardiac catheterization  2008  . Vaginal hysterectomy  1993      Medication List       This list is accurate as of: 10/15/13  9:02 PM.  Always use your most recent med list.               albuterol 108 (90 BASE) MCG/ACT inhaler  Commonly known as:  PROVENTIL HFA;VENTOLIN HFA  Inhale 2 puffs into the lungs every 6 (six) hours as needed for wheezing.     aspirin EC 81 MG tablet  Take 81 mg by mouth every morning.     budesonide 0.25 MG/2ML nebulizer solution  Commonly known as:  PULMICORT  Take 0.25 mg by nebulization 2 (two) times daily.     chlorproMAZINE 100 MG tablet  Commonly known as:  THORAZINE  Take 100 mg by mouth 2 times daily at 12 noon and 4 pm. AND 200 MG PO Q hs     divalproex 500 MG 24 hr tablet  Commonly known as:  DEPAKOTE ER  Take 1,000 mg by mouth at bedtime.     lamoTRIgine 100 MG tablet  Commonly known as:  LAMICTAL  Take 100 mg by mouth 2 (two) times daily.     levothyroxine 150 MCG tablet  Commonly known as:  SYNTHROID, LEVOTHROID  Take 150 mcg by mouth every morning. For thyroid disease.     lithium 600 MG capsule  Take 600 mg by mouth 2 (two) times daily with a meal.     multivitamin tablet  Take 1 tablet by mouth daily.     nitroGLYCERIN 0.4 MG SL tablet  Commonly known as:  NITROSTAT  Place 0.4 mg under the tongue every 5 (five) minutes as needed for chest pain (may repeat for 2 doses).     OLANZapine 15 MG tablet  Commonly known as:  ZYPREXA  Take 30 mg by mouth at bedtime.     omeprazole 20 MG capsule  Commonly known as:  PRILOSEC  Take 40 mg by mouth every morning.     ondansetron 4 MG tablet  Commonly known as:  ZOFRAN  Take 4 mg by mouth every 8 (eight) hours as needed for nausea or vomiting.     polyethylene glycol packet  Commonly known as:  MIRALAX / GLYCOLAX  Take 17 g by  mouth 2 (two) times a week. Tuesday and Friday        No orders of the defined types were placed in this encounter.    Immunization History  Administered Date(s) Administered  . Influenza Split 03/29/2012  . Influenza Whole 05/27/2009, 02/18/2010  . Influenza,inj,Quad PF,36+ Mos 04/24/2013  . Pneumococcal Polysaccharide-23 05/20/2009, 03/29/2012  . Td 05/27/2009    History  Substance Use Topics  . Smoking status: Former Smoker -- 2.00 packs/day for 12 years    Types: Cigarettes    Quit date: 06/29/2000  . Smokeless tobacco: Current User    Types: Chew  . Alcohol Use: No    Review of Systems  DATA OBTAINED: from patient, nurse; pt has no c/o and nurse  notes no issue other than rash GENERAL: Feels well no fevers, fatigue, appetite changes SKIN: + rash HEENT: No complaint RESPIRATORY: No cough, wheezing, SOB CARDIAC: No chest pain, palpitations, lower extremity edema  GI: No abdominal pain, No N/V/D or constipation, No heartburn or reflux  GU: No dysuria, frequency or urgency, or incontinence  MUSCULOSKELETAL: No unrelieved bone/joint pain NEUROLOGIC: No headache, dizziness or focal weakness PSYCHIATRIC: No overt anxiety or sadness. Sleeps well.   Filed Vitals:   10/15/13 2032  BP: 109/75  Pulse: 89  Temp: 98.3 F (36.8 C)  Resp: 18    Physical Exam  GENERAL APPEARANCE: Alert, conversant. Appropriately groomed. No acute distress  SKIN: Upper back is dressed; a few of th shallow ulcerations have a small amt of pus, no cellulitis, slt worse than 3 days ago HEENT: Unremarkable RESPIRATORY: Breathing is even, unlabored. Lung sounds are clear   CARDIOVASCULAR: Heart RRR no murmurs, rubs or gallops. No peripheral edema  GASTROINTESTINAL: Abdomen is soft, non-tender, not distended w/ normal bowel sounds.  GENITOURINARY: Bladder non tender, not distended  MUSCULOSKELETAL: No abnormal joints or musculature NEUROLOGIC: Cranial nerves 2-12 grossly intact. Moves all  extremities no tremor. PSYCHIATRIC: Mood and affect appropriate to situation, no behavioral issues  Very obese. Requires help from another person to examine back.   Patient Active Problem List   Diagnosis Date Noted  . Rash of back 10/15/2013  . Schizophrenia 07/05/2013  . Psychosis 07/05/2013  . Urinary tract infection, site not specified 04/29/2013  . Schizo-affective psychosis 04/19/2013  . COPD (chronic obstructive pulmonary disease)  04/19/2013  . Dyspnea and respiratory abnormality 04/19/2013  . Constipation 12/11/2012  . Hypercapnic respiratory failure, chronic 09/02/2012  . Altered mental status 07/29/2012  . Tachycardia 03/30/2012  . Hypertension 03/28/2012  . Atrial fibrillation 03/28/2012  . Hypothyroidism 03/28/2012  . Diabetes mellitus 03/28/2012  . Hypotension 03/28/2012  . Acute respiratory failure with hypoxia 03/28/2012  . Morbid obesity   . Chronic pain   . Nausea 01/26/2012  . Diarrhea 01/26/2012  . Chest pain 01/11/2012  . Hyponatremia 01/11/2012  . Healthcare-associated pneumonia 01/11/2012  . Community acquired pneumonia 07/29/2011  . Suicidal ideations 07/29/2011  . Schizoaffective disorder, bipolar type 06/20/2011  . ANEMIA 07/13/2010  . CHEST PAIN 07/13/2010  . OBSTRUCTIVE SLEEP APNEA 01/22/2010  . CHOLELITHIASIS 11/30/2009  . MYOCARDIAL INFARCTION 11/17/2009  . ASTHMA 11/17/2009  . INSOMNIA 11/17/2009  . DEGENERATIVE JOINT DISEASE, KNEES, BILATERAL 05/27/2009  . URINALYSIS, ABNORMAL 05/27/2009  . OBESITY 03/26/2009  . DYSPNEA ON EXERTION 08/12/2008  . LOW BACK PAIN SYNDROME 12/11/2007  . HYPOTHYROIDISM NOS 02/06/2007  . DEFICIENCY, B-COMPLEX NEC 02/06/2007  . RETARDATION, MENTAL, MILD 02/06/2007  . HYPERTENSION, BENIGN ESSENTIAL 02/06/2007  . GERD 02/06/2007  . CHEST PAIN, HX OF 02/21/2006  . TARDIVE DYSKINESIA 08/10/2004    CBC    Component Value Date/Time   WBC 7.4 05/20/2013 1840   RBC 3.61* 05/20/2013 1840   RBC 2.70* 05/19/2011  0632   HGB 11.9* 05/20/2013 1840   HCT 36.2 05/20/2013 1840   PLT 236 05/20/2013 1840   MCV 100.3* 05/20/2013 1840   LYMPHSABS 2.2 04/24/2013 0805   MONOABS 0.6 04/24/2013 0805   EOSABS 0.1 04/24/2013 0805   BASOSABS 0.0 04/24/2013 0805    CMP     Component Value Date/Time   NA 134* 05/20/2013 1840   K 3.9 05/20/2013 1840   CL 98 05/20/2013 1840   CO2 28 05/20/2013 1840   GLUCOSE 102* 05/20/2013 1840   BUN 7 05/20/2013 1840   CREATININE 0.56 05/20/2013 1840   CALCIUM 10.2 05/20/2013 1840   PROT 7.4 05/20/2013 1840   ALBUMIN 3.3* 05/20/2013 1840   AST 11 05/20/2013 1840   ALT 6 05/20/2013 1840   ALKPHOS 71 05/20/2013 1840   BILITOT 0.2* 05/20/2013 1840   GFRNONAA >90 05/20/2013 1840   GFRAA >90 05/20/2013 1840    Assessment and Plan  Rash of back Pt's back today looks worse than it did 3 days ago, but not horrible, small pus. The culture from the back grew out > 100,000Klebsiella and > 100,000 Psuedomonas. Pt was started on Cipro yesterday per phone trying to work with a nurse who didn't really understand what she was looking at. Today I see that the Pseudomonas is well covered with cipro for 2 weeks, but the klebsiella will need ertapenum 1 gm IV daily for a week based on MIC's.    Margit Hanks, MD

## 2013-10-27 IMAGING — CR DG CHEST 2V
2 series · 2 of 2 positions shown · non-contrast
Comparison: CT and plain film of the chest 06/16/2012.

CLINICAL DATA: Acute onset chest pain and shortness of breath.

CHEST - 2 VIEW

[w chest lat]
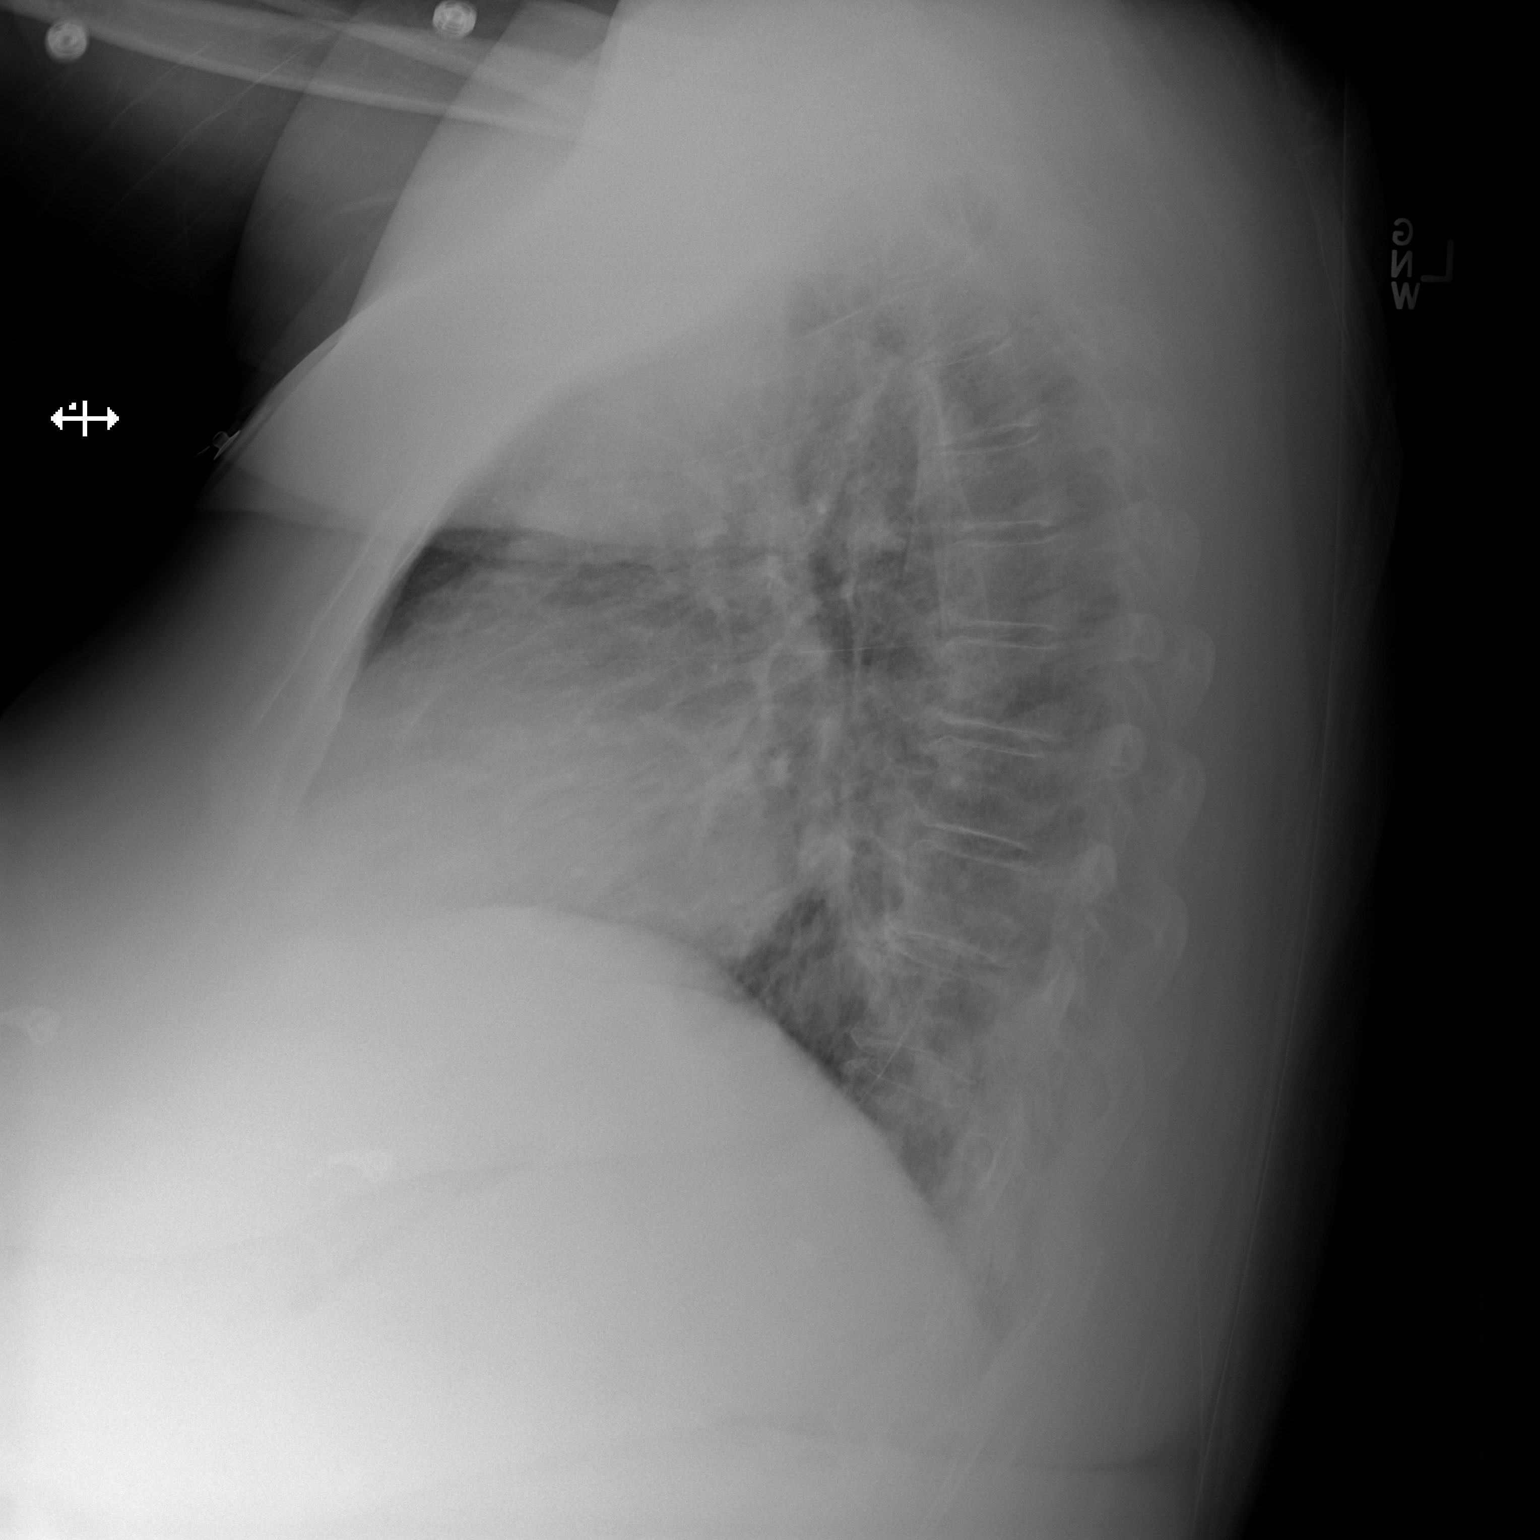

[x chest ap]
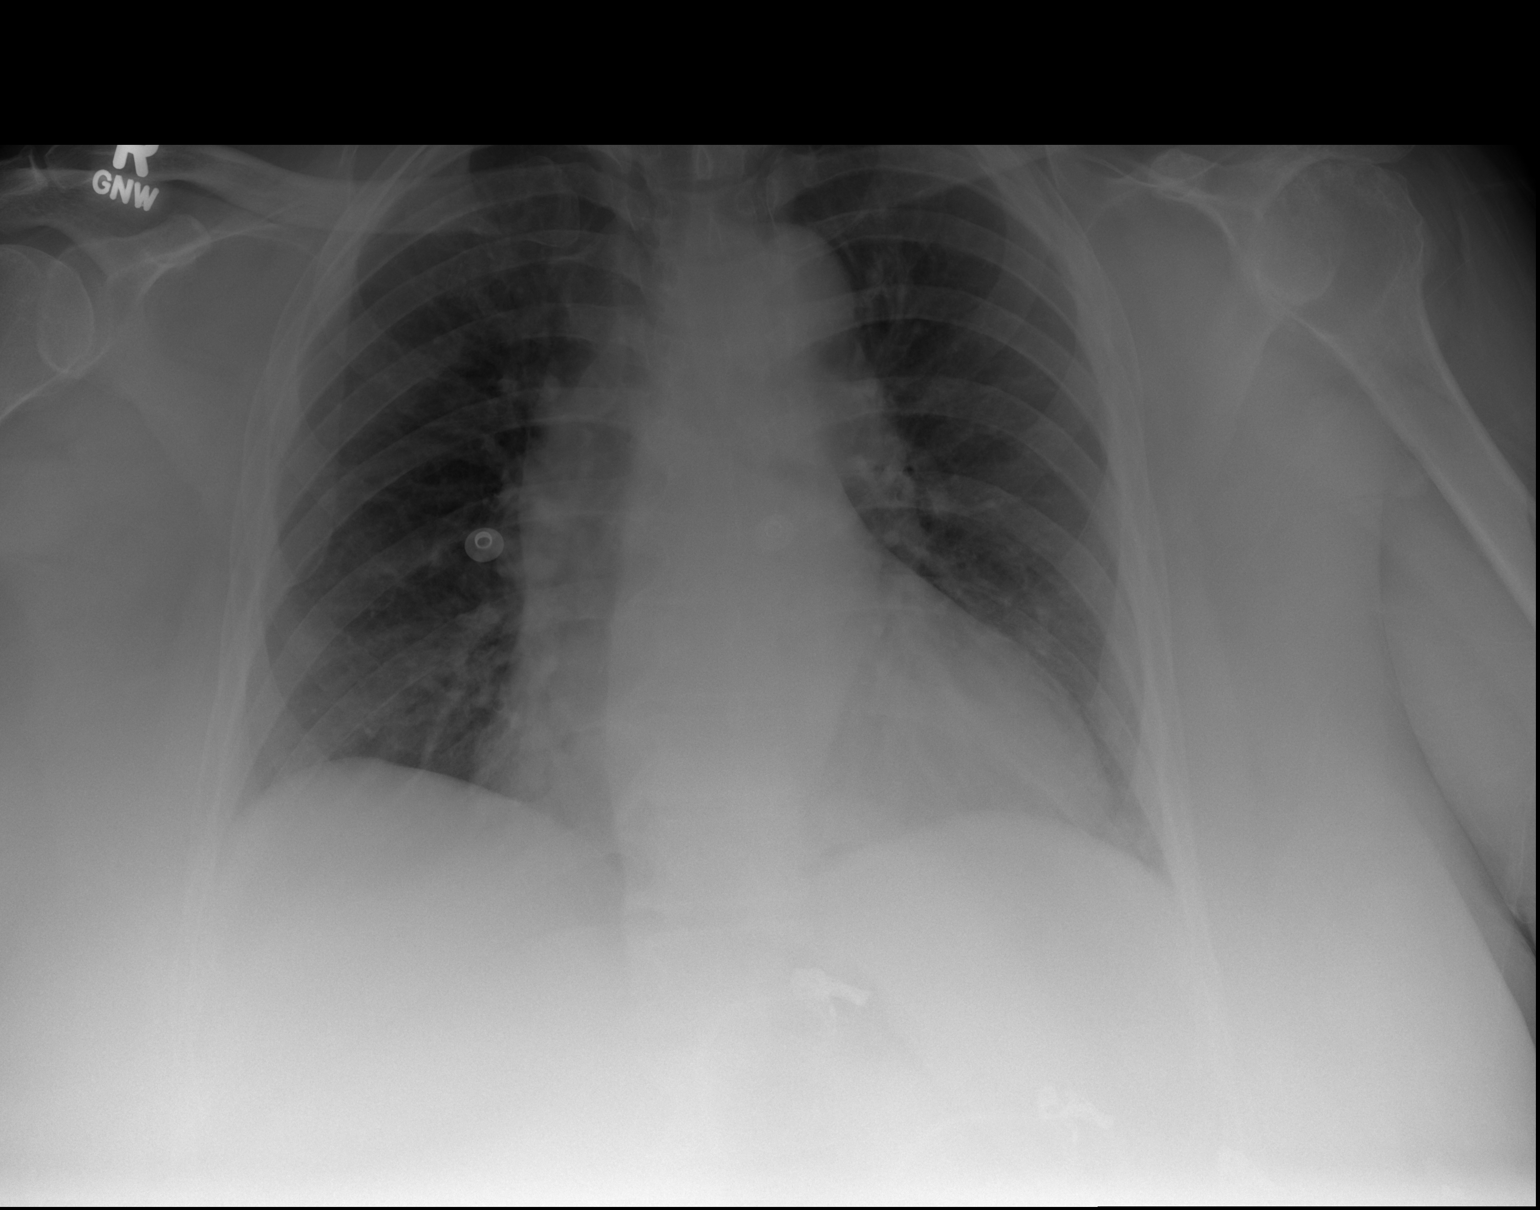

[2 of 2 positions shown; findings below may reference images not displayed]

FINDINGS: There is cardiomegaly but no edema.  Lungs clear.  No
pneumothorax or pleural effusion.
IMPRESSION: Cardiomegaly without acute disease.

## 2013-11-05 ENCOUNTER — Emergency Department (HOSPITAL_COMMUNITY): Payer: Medicare Other

## 2013-11-05 ENCOUNTER — Non-Acute Institutional Stay (SKILLED_NURSING_FACILITY): Payer: Medicare Other | Admitting: Internal Medicine

## 2013-11-05 ENCOUNTER — Inpatient Hospital Stay (HOSPITAL_COMMUNITY)
Admission: EM | Admit: 2013-11-05 | Discharge: 2013-11-08 | DRG: 918 | Disposition: A | Discharge status: Skilled Nursing Facility | Payer: Medicare Other | Attending: Internal Medicine | Admitting: Internal Medicine

## 2013-11-05 ENCOUNTER — Encounter (HOSPITAL_COMMUNITY): Payer: Self-pay | Admitting: Emergency Medicine

## 2013-11-05 DIAGNOSIS — F250 Schizoaffective disorder, bipolar type: Secondary | ICD-10-CM

## 2013-11-05 DIAGNOSIS — G8929 Other chronic pain: Secondary | ICD-10-CM

## 2013-11-05 DIAGNOSIS — F29 Unspecified psychosis not due to a substance or known physiological condition: Secondary | ICD-10-CM

## 2013-11-05 DIAGNOSIS — M1710 Unilateral primary osteoarthritis, unspecified knee: Secondary | ICD-10-CM

## 2013-11-05 DIAGNOSIS — R110 Nausea: Secondary | ICD-10-CM

## 2013-11-05 DIAGNOSIS — F209 Schizophrenia, unspecified: Secondary | ICD-10-CM

## 2013-11-05 DIAGNOSIS — E538 Deficiency of other specified B group vitamins: Secondary | ICD-10-CM

## 2013-11-05 DIAGNOSIS — F259 Schizoaffective disorder, unspecified: Secondary | ICD-10-CM

## 2013-11-05 DIAGNOSIS — J4489 Other specified chronic obstructive pulmonary disease: Secondary | ICD-10-CM | POA: Diagnosis present

## 2013-11-05 DIAGNOSIS — R0600 Dyspnea, unspecified: Secondary | ICD-10-CM

## 2013-11-05 DIAGNOSIS — IMO0002 Reserved for concepts with insufficient information to code with codable children: Secondary | ICD-10-CM

## 2013-11-05 DIAGNOSIS — K5900 Constipation, unspecified: Secondary | ICD-10-CM

## 2013-11-05 DIAGNOSIS — R410 Disorientation, unspecified: Secondary | ICD-10-CM

## 2013-11-05 DIAGNOSIS — I10 Essential (primary) hypertension: Secondary | ICD-10-CM

## 2013-11-05 DIAGNOSIS — F319 Bipolar disorder, unspecified: Secondary | ICD-10-CM | POA: Diagnosis present

## 2013-11-05 DIAGNOSIS — J9612 Chronic respiratory failure with hypercapnia: Secondary | ICD-10-CM

## 2013-11-05 DIAGNOSIS — Z87891 Personal history of nicotine dependence: Secondary | ICD-10-CM

## 2013-11-05 DIAGNOSIS — Z882 Allergy status to sulfonamides status: Secondary | ICD-10-CM

## 2013-11-05 DIAGNOSIS — D649 Anemia, unspecified: Secondary | ICD-10-CM

## 2013-11-05 DIAGNOSIS — I959 Hypotension, unspecified: Secondary | ICD-10-CM

## 2013-11-05 DIAGNOSIS — R45851 Suicidal ideations: Secondary | ICD-10-CM

## 2013-11-05 DIAGNOSIS — Z8679 Personal history of other diseases of the circulatory system: Secondary | ICD-10-CM

## 2013-11-05 DIAGNOSIS — J9601 Acute respiratory failure with hypoxia: Secondary | ICD-10-CM

## 2013-11-05 DIAGNOSIS — M545 Low back pain, unspecified: Secondary | ICD-10-CM

## 2013-11-05 DIAGNOSIS — R82998 Other abnormal findings in urine: Secondary | ICD-10-CM

## 2013-11-05 DIAGNOSIS — R4182 Altered mental status, unspecified: Secondary | ICD-10-CM

## 2013-11-05 DIAGNOSIS — F70 Mild intellectual disabilities: Secondary | ICD-10-CM

## 2013-11-05 DIAGNOSIS — I219 Acute myocardial infarction, unspecified: Secondary | ICD-10-CM

## 2013-11-05 DIAGNOSIS — N390 Urinary tract infection, site not specified: Secondary | ICD-10-CM

## 2013-11-05 DIAGNOSIS — Z6841 Body Mass Index (BMI) 40.0 and over, adult: Secondary | ICD-10-CM

## 2013-11-05 DIAGNOSIS — I4891 Unspecified atrial fibrillation: Secondary | ICD-10-CM

## 2013-11-05 DIAGNOSIS — R079 Chest pain, unspecified: Secondary | ICD-10-CM

## 2013-11-05 DIAGNOSIS — K8020 Calculus of gallbladder without cholecystitis without obstruction: Secondary | ICD-10-CM

## 2013-11-05 DIAGNOSIS — T56891A Toxic effect of other metals, accidental (unintentional), initial encounter: Secondary | ICD-10-CM

## 2013-11-05 DIAGNOSIS — G40909 Epilepsy, unspecified, not intractable, without status epilepticus: Secondary | ICD-10-CM | POA: Diagnosis present

## 2013-11-05 DIAGNOSIS — T438X5A Adverse effect of other psychotropic drugs, initial encounter: Secondary | ICD-10-CM | POA: Diagnosis present

## 2013-11-05 DIAGNOSIS — R197 Diarrhea, unspecified: Secondary | ICD-10-CM

## 2013-11-05 DIAGNOSIS — M129 Arthropathy, unspecified: Secondary | ICD-10-CM | POA: Diagnosis present

## 2013-11-05 DIAGNOSIS — E871 Hypo-osmolality and hyponatremia: Secondary | ICD-10-CM

## 2013-11-05 DIAGNOSIS — Z880 Allergy status to penicillin: Secondary | ICD-10-CM

## 2013-11-05 DIAGNOSIS — Z7401 Bed confinement status: Secondary | ICD-10-CM

## 2013-11-05 DIAGNOSIS — T50995A Adverse effect of other drugs, medicaments and biological substances, initial encounter: Principal | ICD-10-CM | POA: Diagnosis present

## 2013-11-05 DIAGNOSIS — E669 Obesity, unspecified: Secondary | ICD-10-CM

## 2013-11-05 DIAGNOSIS — R1310 Dysphagia, unspecified: Secondary | ICD-10-CM | POA: Diagnosis present

## 2013-11-05 DIAGNOSIS — R0689 Other abnormalities of breathing: Secondary | ICD-10-CM

## 2013-11-05 DIAGNOSIS — G4700 Insomnia, unspecified: Secondary | ICD-10-CM

## 2013-11-05 DIAGNOSIS — R0609 Other forms of dyspnea: Secondary | ICD-10-CM

## 2013-11-05 DIAGNOSIS — Z8249 Family history of ischemic heart disease and other diseases of the circulatory system: Secondary | ICD-10-CM

## 2013-11-05 DIAGNOSIS — Z7982 Long term (current) use of aspirin: Secondary | ICD-10-CM

## 2013-11-05 DIAGNOSIS — G4733 Obstructive sleep apnea (adult) (pediatric): Secondary | ICD-10-CM

## 2013-11-05 DIAGNOSIS — E119 Type 2 diabetes mellitus without complications: Secondary | ICD-10-CM

## 2013-11-05 DIAGNOSIS — R000 Tachycardia, unspecified: Secondary | ICD-10-CM

## 2013-11-05 DIAGNOSIS — J189 Pneumonia, unspecified organism: Secondary | ICD-10-CM

## 2013-11-05 DIAGNOSIS — F09 Unspecified mental disorder due to known physiological condition: Secondary | ICD-10-CM | POA: Diagnosis present

## 2013-11-05 DIAGNOSIS — E039 Hypothyroidism, unspecified: Secondary | ICD-10-CM

## 2013-11-05 DIAGNOSIS — I509 Heart failure, unspecified: Secondary | ICD-10-CM | POA: Diagnosis present

## 2013-11-05 DIAGNOSIS — G244 Idiopathic orofacial dystonia: Secondary | ICD-10-CM

## 2013-11-05 DIAGNOSIS — Z79899 Other long term (current) drug therapy: Secondary | ICD-10-CM

## 2013-11-05 DIAGNOSIS — F0390 Unspecified dementia without behavioral disturbance: Secondary | ICD-10-CM | POA: Diagnosis present

## 2013-11-05 DIAGNOSIS — I252 Old myocardial infarction: Secondary | ICD-10-CM

## 2013-11-05 DIAGNOSIS — E7800 Pure hypercholesterolemia, unspecified: Secondary | ICD-10-CM | POA: Diagnosis present

## 2013-11-05 DIAGNOSIS — R0989 Other specified symptoms and signs involving the circulatory and respiratory systems: Secondary | ICD-10-CM

## 2013-11-05 DIAGNOSIS — K219 Gastro-esophageal reflux disease without esophagitis: Secondary | ICD-10-CM

## 2013-11-05 DIAGNOSIS — J449 Chronic obstructive pulmonary disease, unspecified: Secondary | ICD-10-CM

## 2013-11-05 DIAGNOSIS — I5032 Chronic diastolic (congestive) heart failure: Secondary | ICD-10-CM | POA: Diagnosis present

## 2013-11-05 DIAGNOSIS — J45909 Unspecified asthma, uncomplicated: Secondary | ICD-10-CM

## 2013-11-05 DIAGNOSIS — R21 Rash and other nonspecific skin eruption: Secondary | ICD-10-CM

## 2013-11-05 HISTORY — DX: Anxiety disorder, unspecified: F41.9

## 2013-11-05 HISTORY — DX: Unspecified mood (affective) disorder: F39

## 2013-11-05 HISTORY — DX: Unspecified psychosis not due to a substance or known physiological condition: F29

## 2013-11-05 HISTORY — DX: Schizophrenia, unspecified: F20.9

## 2013-11-05 LAB — CBC
HEMATOCRIT: 41.9 % (ref 36.0–46.0)
Hemoglobin: 12.7 g/dL (ref 12.0–15.0)
MCH: 33.5 pg (ref 26.0–34.0)
MCHC: 30.3 g/dL (ref 30.0–36.0)
MCV: 110.6 fL — ABNORMAL HIGH (ref 78.0–100.0)
Platelets: 214 10*3/uL (ref 150–400)
RBC: 3.79 MIL/uL — ABNORMAL LOW (ref 3.87–5.11)
RDW: 13.4 % (ref 11.5–15.5)
WBC: 6.6 10*3/uL (ref 4.0–10.5)

## 2013-11-05 LAB — URINALYSIS, ROUTINE W REFLEX MICROSCOPIC
Glucose, UA: NEGATIVE mg/dL
Hgb urine dipstick: NEGATIVE
NITRITE: NEGATIVE
PROTEIN: 30 mg/dL — AB
Specific Gravity, Urine: 1.027 (ref 1.005–1.030)
Urobilinogen, UA: 1 mg/dL (ref 0.0–1.0)
pH: 6.5 (ref 5.0–8.0)

## 2013-11-05 LAB — COMPREHENSIVE METABOLIC PANEL
ALK PHOS: 118 U/L — AB (ref 39–117)
ALT: 10 U/L (ref 0–35)
AST: 12 U/L (ref 0–37)
Albumin: 3.4 g/dL — ABNORMAL LOW (ref 3.5–5.2)
BILIRUBIN TOTAL: 0.2 mg/dL — AB (ref 0.3–1.2)
BUN: 12 mg/dL (ref 6–23)
CHLORIDE: 95 meq/L — AB (ref 96–112)
CO2: 28 meq/L (ref 19–32)
Calcium: 11.4 mg/dL — ABNORMAL HIGH (ref 8.4–10.5)
Creatinine, Ser: 0.67 mg/dL (ref 0.50–1.10)
Glucose, Bld: 63 mg/dL — ABNORMAL LOW (ref 70–99)
POTASSIUM: 4.3 meq/L (ref 3.7–5.3)
Sodium: 135 mEq/L — ABNORMAL LOW (ref 137–147)
Total Protein: 7 g/dL (ref 6.0–8.3)

## 2013-11-05 LAB — URINE MICROSCOPIC-ADD ON

## 2013-11-05 LAB — TSH: TSH: 2.14 u[IU]/mL (ref 0.350–4.500)

## 2013-11-05 LAB — VALPROIC ACID LEVEL: Valproic Acid Lvl: 16.1 ug/mL — ABNORMAL LOW (ref 50.0–100.0)

## 2013-11-05 LAB — CBG MONITORING, ED: GLUCOSE-CAPILLARY: 79 mg/dL (ref 70–99)

## 2013-11-05 LAB — MRSA PCR SCREENING: MRSA by PCR: NEGATIVE

## 2013-11-05 LAB — LITHIUM LEVEL: Lithium Lvl: 3.44 mEq/L (ref 0.80–1.40)

## 2013-11-05 MED ORDER — SODIUM CHLORIDE 0.9 % IV BOLUS (SEPSIS)
500.0000 mL | Freq: Once | INTRAVENOUS | Status: AC
  Administered 2013-11-05: 500 mL via INTRAVENOUS

## 2013-11-05 MED ORDER — LAMOTRIGINE 100 MG PO TABS
100.0000 mg | ORAL_TABLET | Freq: Two times a day (BID) | ORAL | Status: DC
  Administered 2013-11-06 – 2013-11-08 (×4): 100 mg via ORAL
  Filled 2013-11-05 (×7): qty 1

## 2013-11-05 MED ORDER — CHLORPROMAZINE HCL 100 MG PO TABS
100.0000 mg | ORAL_TABLET | Freq: Two times a day (BID) | ORAL | Status: DC
  Filled 2013-11-05 (×3): qty 1

## 2013-11-05 MED ORDER — SODIUM CHLORIDE 0.9 % IJ SOLN: 3.0000 mL | Freq: Two times a day (BID) | INTRAMUSCULAR | Status: DC

## 2013-11-05 MED ORDER — ONDANSETRON HCL 4 MG/2ML IJ SOLN: 4.0000 mg | Freq: Four times a day (QID) | INTRAMUSCULAR | Status: DC | PRN

## 2013-11-05 MED ORDER — CHLORPROMAZINE HCL 100 MG PO TABS
200.0000 mg | ORAL_TABLET | Freq: Every day | ORAL | Status: DC
  Administered 2013-11-06: 200 mg via ORAL
  Filled 2013-11-05 (×3): qty 2

## 2013-11-05 MED ORDER — ALBUTEROL SULFATE HFA 108 (90 BASE) MCG/ACT IN AERS: 2.0000 | INHALATION_SPRAY | Freq: Four times a day (QID) | RESPIRATORY_TRACT | Status: DC | PRN

## 2013-11-05 MED ORDER — ALBUTEROL SULFATE (2.5 MG/3ML) 0.083% IN NEBU: 2.5000 mg | INHALATION_SOLUTION | Freq: Four times a day (QID) | RESPIRATORY_TRACT | Status: DC | PRN

## 2013-11-05 MED ORDER — HYDROCODONE-ACETAMINOPHEN 5-325 MG PO TABS: 1.0000 | ORAL_TABLET | ORAL | Status: DC | PRN

## 2013-11-05 MED ORDER — SODIUM CHLORIDE 0.9 % IV SOLN
INTRAVENOUS | Status: DC
  Administered 2013-11-05: 20:00:00 via INTRAVENOUS
  Administered 2013-11-06: 100 mL/h via INTRAVENOUS
  Administered 2013-11-06 – 2013-11-07 (×2): via INTRAVENOUS

## 2013-11-05 MED ORDER — LEVOTHYROXINE SODIUM 150 MCG PO TABS
150.0000 ug | ORAL_TABLET | Freq: Every morning | ORAL | Status: DC
  Administered 2013-11-07 – 2013-11-08 (×2): 150 ug via ORAL
  Filled 2013-11-05 (×3): qty 1

## 2013-11-05 MED ORDER — SODIUM CHLORIDE 0.9 % IV BOLUS (SEPSIS)
1000.0000 mL | Freq: Once | INTRAVENOUS | Status: AC
  Administered 2013-11-05: 1000 mL via INTRAVENOUS

## 2013-11-05 MED ORDER — DOCUSATE SODIUM 100 MG PO CAPS
100.0000 mg | ORAL_CAPSULE | Freq: Two times a day (BID) | ORAL | Status: DC
Start: 2013-11-05 — End: 2013-11-08
  Administered 2013-11-06 – 2013-11-07 (×2): 100 mg via ORAL
  Filled 2013-11-05 (×7): qty 1

## 2013-11-05 MED ORDER — SODIUM CHLORIDE 0.9 % IV SOLN
INTRAVENOUS | Status: DC
  Administered 2013-11-05: 18:00:00 via INTRAVENOUS

## 2013-11-05 MED ORDER — BIOTENE DRY MOUTH MT LIQD
15.0000 mL | Freq: Two times a day (BID) | OROMUCOSAL | Status: DC
  Administered 2013-11-07 – 2013-11-08 (×3): 15 mL via OROMUCOSAL

## 2013-11-05 MED ORDER — BISACODYL 10 MG RE SUPP: 10.0000 mg | Freq: Every day | RECTAL | Status: DC | PRN

## 2013-11-05 MED ORDER — OLANZAPINE 10 MG PO TABS
30.0000 mg | ORAL_TABLET | Freq: Every day | ORAL | Status: DC
  Administered 2013-11-06: 30 mg via ORAL
  Filled 2013-11-05 (×3): qty 3

## 2013-11-05 MED ORDER — ALUM & MAG HYDROXIDE-SIMETH 200-200-20 MG/5ML PO SUSP: 30.0000 mL | Freq: Four times a day (QID) | ORAL | Status: DC | PRN

## 2013-11-05 MED ORDER — ASPIRIN EC 81 MG PO TBEC
81.0000 mg | DELAYED_RELEASE_TABLET | Freq: Every morning | ORAL | Status: DC
  Administered 2013-11-07 – 2013-11-08 (×2): 81 mg via ORAL
  Filled 2013-11-05 (×3): qty 1

## 2013-11-05 MED ORDER — DEXTROSE 50 % IV SOLN
25.0000 mL | Freq: Once | INTRAVENOUS | Status: AC
  Administered 2013-11-05: 25 mL via INTRAVENOUS
  Filled 2013-11-05: qty 50

## 2013-11-05 MED ORDER — ADULT MULTIVITAMIN W/MINERALS CH
1.0000 | ORAL_TABLET | Freq: Every day | ORAL | Status: DC
  Administered 2013-11-07 – 2013-11-08 (×2): 1 via ORAL
  Filled 2013-11-05 (×4): qty 1

## 2013-11-05 MED ORDER — HEPARIN SODIUM (PORCINE) 5000 UNIT/ML IJ SOLN
5000.0000 [IU] | Freq: Three times a day (TID) | INTRAMUSCULAR | Status: DC
  Administered 2013-11-05 – 2013-11-08 (×8): 5000 [IU] via SUBCUTANEOUS
  Filled 2013-11-05 (×11): qty 1

## 2013-11-05 MED ORDER — BUDESONIDE 0.25 MG/2ML IN SUSP
0.2500 mg | Freq: Two times a day (BID) | RESPIRATORY_TRACT | Status: DC
  Administered 2013-11-06 – 2013-11-08 (×4): 0.25 mg via RESPIRATORY_TRACT
  Filled 2013-11-05 (×11): qty 2

## 2013-11-05 MED ORDER — DIVALPROEX SODIUM ER 500 MG PO TB24
1000.0000 mg | ORAL_TABLET | Freq: Every day | ORAL | Status: DC
  Filled 2013-11-05 (×2): qty 2

## 2013-11-05 MED ORDER — MAGNESIUM HYDROXIDE 400 MG/5ML PO SUSP: 30.0000 mL | Freq: Every day | ORAL | Status: DC | PRN

## 2013-11-05 MED ORDER — ACETAMINOPHEN 325 MG PO TABS: 650.0000 mg | ORAL_TABLET | Freq: Four times a day (QID) | ORAL | Status: DC | PRN

## 2013-11-05 MED ORDER — VITAMIN B-1 100 MG PO TABS
100.0000 mg | ORAL_TABLET | Freq: Every day | ORAL | Status: DC
  Administered 2013-11-07 – 2013-11-08 (×2): 100 mg via ORAL
  Filled 2013-11-05 (×4): qty 1

## 2013-11-05 MED ORDER — PANTOPRAZOLE SODIUM 40 MG PO TBEC
40.0000 mg | DELAYED_RELEASE_TABLET | Freq: Every day | ORAL | Status: DC
  Filled 2013-11-05 (×4): qty 1

## 2013-11-05 MED ORDER — ACETAMINOPHEN 650 MG RE SUPP: 650.0000 mg | Freq: Four times a day (QID) | RECTAL | Status: DC | PRN

## 2013-11-05 MED ORDER — FLEET ENEMA 7-19 GM/118ML RE ENEM: 1.0000 | ENEMA | Freq: Once | RECTAL | Status: AC | PRN

## 2013-11-05 MED ORDER — ONDANSETRON HCL 4 MG PO TABS: 4.0000 mg | ORAL_TABLET | Freq: Four times a day (QID) | ORAL | Status: DC | PRN

## 2013-11-05 MED ORDER — FOLIC ACID 1 MG PO TABS
1.0000 mg | ORAL_TABLET | Freq: Every day | ORAL | Status: DC
  Administered 2013-11-07 – 2013-11-08 (×2): 1 mg via ORAL
  Filled 2013-11-05 (×4): qty 1

## 2013-11-05 NOTE — Progress Notes (Signed)
Pt has blister like wounds all over her back. Some wounds appear pustule like, some are flat, and some are scabbed over. There are many wounds close together, it is hard to tell where some begin and others start. Pt has Tegaderm like material over them (placed at her home facility.) Skin around the wounds is still blanchable. Pt has what appears to be scars on bottom from previous wounds but none are open and skin on bottom is still blanching. Will alert all nurses who will care for this patient about this note.

## 2013-11-05 NOTE — H&P (Signed)
[facility] History and Physical  [name]:[id number] DOB: 04-06-1951 DOA: 11/05/2013  Referring physician: Suzi Roots, MD PCP: Bufford Spikes, DO   Chief Complaint: Lithium Toxicity  HPI: [name] is a 63 y.o. female with history of schizoaffective disorder who presents with altered mental status. She has not been speaking and has basically been mumbling even now during my evaluation. She is not able to provide any other history. According to the notes this has been going on since yesterday. There has been no observed seizure activity. She has not had any trauma reported. She has no LOC noted. She has not had any nausea and no vomiting noted.   Review of Systems:  Patient is not able to provide a ROS  Past Medical History  Diagnosis Date  . Schizoaffective disorder   . Urinary tract infection   . Hypertension   . GERD (gastroesophageal reflux disease)   . Hypothyroidism   . Obesity   . Asthma   . COPD (chronic obstructive pulmonary disease)   . CHF (congestive heart failure)   . Seizures   . Morbid obesity   . Chronic pain   . Hypercholesteremia   . Anginal pain   . Myocardial infarction 2002  . Diabetes mellitus     "I'm not diabetic anymore" (05/23/2012)  . Anemia   . History of blood transfusion 1993    "when I had my hysterectomy" (05/23/2012)  . H/O hiatal hernia   . Daily headache   . Arthritis     "severe; all over my body" (05/23/2012)  . Hypoventilation syndrome     [name] 05/30/2012  . Atrial fibrillation   . Atrial flutter, paroxysmal     [name] 05/30/2012  . Exertional dyspnea   . Shortness of breath     "all the time lately" (05/30/2012)  . Pneumonia     "several times; including now" (04/23/2013)   Past Surgical History  Procedure Laterality Date  . Tubal ligation  1998  . Cholecystectomy  ?2008  . Cardiac catheterization  2008  . Vaginal hysterectomy  1993   Social History:  reports that she quit smoking about 13  years ago. Her smoking use included Cigarettes. She has a 24 pack-year smoking history. Her smokeless tobacco use includes Chew. She reports that she does not drink alcohol or use illicit drugs.  Allergies  Allergen Reactions  . Food Rash    "lasagna"= rash from the noodles  . Penicillins Hives  . Sulfamethoxazole-Trimethoprim Hives and Nausea And Vomiting    Family History  Problem Relation Age of Onset  . Heart disease Father   . Cancer Mother 24    Breast     Prior to Admission medications   Medication Sig Start Date End Date Taking? Authorizing Provider  aspirin EC 81 MG tablet Take 81 mg by mouth every morning.  05/11/12  Yes Verne Spurr, PA-C  bisacodyl (DULCOLAX) 10 MG suppository Place 10 mg rectally every 3 (three) days as needed for mild constipation or moderate constipation.   Yes Historical Provider, MD  budesonide (PULMICORT) 0.25 MG/2ML nebulizer solution Take 0.25 mg by nebulization 2 (two) times daily.   Yes Historical Provider, MD  chlorproMAZINE (THORAZINE) 100 MG tablet Take 100 mg by mouth 2 (two) times daily.   Yes Historical Provider, MD  chlorproMAZINE (THORAZINE) 200 MG tablet Take 200 mg by mouth at bedtime.   Yes Historical Provider, MD  divalproex (DEPAKOTE ER) 500 MG 24 hr tablet Take 1,000  mg by mouth at bedtime.   Yes Historical Provider, MD  lamoTRIgine (LAMICTAL) 100 MG tablet Take 100 mg by mouth 2 (two) times daily.   Yes Historical Provider, MD  levothyroxine (SYNTHROID, LEVOTHROID) 150 MCG tablet Take 150 mcg by mouth every morning. For thyroid disease. 05/11/12  Yes Verne Spurr, PA-C  lithium 600 MG capsule Take 600 mg by mouth 2 (two) times daily with a meal.   Yes Historical Provider, MD  magnesium hydroxide (MILK OF MAGNESIA) 400 MG/5ML suspension Take 30 mLs by mouth daily as needed for mild constipation.   Yes Historical Provider, MD  Multiple Vitamin (MULTIVITAMIN) tablet Take 1 tablet by mouth daily.   Yes Historical Provider, MD    OLANZapine (ZYPREXA) 15 MG tablet Take 30 mg by mouth at bedtime.   Yes Historical Provider, MD  omeprazole (PRILOSEC) 20 MG capsule Take 40 mg by mouth every morning.  05/11/12  Yes Verne Spurr, PA-C  albuterol (PROVENTIL HFA;VENTOLIN HFA) 108 (90 BASE) MCG/ACT inhaler Inhale 2 puffs into the lungs every 6 (six) hours as needed for wheezing.    Historical Provider, MD  ciprofloxacin (CIPRO) 500 MG tablet Take 500 mg by mouth 2 (two) times daily. Give until 10/30/13    Historical Provider, MD   Physical Exam: Filed Vitals:   11/05/13 1800  BP: 119/71  Pulse: 80  Temp:   Resp: 15    BP 119/71  Pulse 80  Temp(Src) 98.9 F (37.2 C) (Oral)  Resp 15  SpO2 100%  General:  Appears calm and comfortable Eyes: PERRL, normal lids, irises & conjunctiva ENT: grossly normal lips & tongue Neck: no LAD, masses or thyromegaly Cardiovascular: RRR, no m/r/g. Bilateral edema noted Telemetry: SR, no arrhythmias  Respiratory: CTA bilaterally, no w/r/r. Normal respiratory effort. Abdomen: soft, ntnd Skin: no rash or induration seen on limited exam Musculoskeletal: grossly normal tone BUE/BLE Psychiatric: mumbled speech as basline Neurologic: grossly non-focal. Moving all four extremities          Labs on Admission:  Basic Metabolic Panel:  Recent Labs Lab 11/05/13 1640  NA 135*  K 4.3  CL 95*  CO2 28  GLUCOSE 63*  BUN 12  CREATININE 0.67  CALCIUM 11.4*   Liver Function Tests:  Recent Labs Lab 11/05/13 1640  AST 12  ALT 10  ALKPHOS 118*  BILITOT 0.2*  PROT 7.0  ALBUMIN 3.4*   No results found for this basename: LIPASE, AMYLASE,  in the last 168 hours No results found for this basename: AMMONIA,  in the last 168 hours CBC:  Recent Labs Lab 11/05/13 1640  WBC 6.6  HGB 12.7  HCT 41.9  MCV 110.6*  PLT 214   Cardiac Enzymes: No results found for this basename: CKTOTAL, CKMB, CKMBINDEX, TROPONINI,  in the last 168 hours  BNP (last 3 results) No results found for  this basename: PROBNP,  in the last 8760 hours CBG:  Recent Labs Lab 11/05/13 1805  GLUCAP 79    Radiological Exams on Admission: Ct Head Wo Contrast  11/05/2013   CLINICAL DATA:  Pain  EXAM: CT HEAD WITHOUT CONTRAST  TECHNIQUE: Contiguous axial images were obtained from the base of the skull through the vertex without intravenous contrast.  COMPARISON:  Prior CT from 07/28/2012  FINDINGS: Study is degraded by motion artifact.  There is no acute intracranial hemorrhage or infarct. No mass lesion or midline shift. Gray-white matter differentiation is well maintained. Ventricles are normal in size without evidence of hydrocephalus. CSF containing  spaces are within normal limits. No extra-axial fluid collection.  The calvarium is intact.  Orbital soft tissues are within normal limits.  The paranasal sinuses and mastoid air cells are well pneumatized and free of fluid.  Scalp soft tissues are unremarkable.  IMPRESSION: No acute intracranial abnormality.   Electronically Signed   By: Rise MuBenjamin  McClintock M.D.   On: 11/05/2013 18:01    EKG: Independently reviewed. NSR  Assessment/Plan Principal Problem:   Lithium toxicity Active Problems:   HYPOTHYROIDISM NOS   HYPERTENSION, BENIGN ESSENTIAL   ASTHMA   Atrial fibrillation   1. Lithium Toxicity -level almost two times the therapeutic level but below four -will need admission on telemetry -IV hydration ordered -currently no indication for dialysis will monitor  2. Hypothyroidism -will continue with her thyroid supplementations -check TSH level  3. Hypertension -blood pressure is under control presently  4. Asthma -continue with inhalers -currently under control  5. Atrial Fibrillation -currently in sinus rhythymn   Code Status: Full Code (must indicate code status--if unknown or must be presumed, indicate so) Family Communication: No family in room (indicate person spoken with, if applicable, with phone number if by  telephone) Disposition Plan: SNF (indicate anticipated LOS)  Time spent: 50min  Yevonne PaxSaadat A Tymira Horkey [facility] Pager [phone]  **Disclaimer: This note may have been dictated with voice recognition software. Similar sounding words can inadvertently be transcribed and this note may contain transcription errors which may not have been corrected upon publication of note.**

## 2013-11-05 NOTE — ED Notes (Signed)
Per EMS patient is a resident of [facility] and staff reported patient with decreased loc x one week.  Non-verbal per staff x 4 days.  EMS states patient appeared to be attempting to speak but no words would come out.  Per staff patient is normally hostile.

## 2013-11-05 NOTE — ED Provider Notes (Signed)
CSN: [id number]     Arrival date & time 11/05/13  1625 History   First MD Initiated Contact with Patient 11/05/13 1627     Chief Complaint  Patient presents with  . Altered Mental Status     (Consider location/radiation/quality/duration/timing/severity/associated sxs/prior Treatment) Patient is a 63 y.o. female presenting with altered mental status. The history is provided by the patient and the EMS personnel. The history is limited by the condition of the patient.  Altered Mental Status pt with hx schizoaffective disorder, copd, htn, sent from ecf via ems for evaluation of altered mental status for the past several days. Pt report, pt not talking/conversant for the past 3-4 days. Today has remained in bed, mumbling incoherently.  No report of recent trauma of fall. No recent fevers or medication changes reported.  Pt not responsive to questioning, unintelligible speech - level 5 caveat.       Past Medical History  Diagnosis Date  . Schizoaffective disorder   . Urinary tract infection   . Hypertension   . GERD (gastroesophageal reflux disease)   . Hypothyroidism   . Obesity   . Asthma   . COPD (chronic obstructive pulmonary disease)   . CHF (congestive heart failure)   . Seizures   . Morbid obesity   . Chronic pain   . Hypercholesteremia   . Anginal pain   . Myocardial infarction 2002  . Diabetes mellitus     "I'm not diabetic anymore" (05/23/2012)  . Anemia   . History of blood transfusion 1993    "when I had my hysterectomy" (05/23/2012)  . H/O hiatal hernia   . Daily headache   . Arthritis     "severe; all over my body" (05/23/2012)  . Hypoventilation syndrome     [name] 05/30/2012  . Atrial fibrillation   . Atrial flutter, paroxysmal     [name] 05/30/2012  . Exertional dyspnea   . Shortness of breath     "all the time lately" (05/30/2012)  . Pneumonia     "several times; including now" (04/23/2013)   Past Surgical History  Procedure Laterality Date  . Tubal  ligation  1998  . Cholecystectomy  ?2008  . Cardiac catheterization  2008  . Vaginal hysterectomy  1993   Family History  Problem Relation Age of Onset  . Heart disease Father   . Cancer Mother 72    Breast   History  Substance Use Topics  . Smoking status: Former Smoker -- 2.00 packs/day for 12 years    Types: Cigarettes    Quit date: 06/29/2000  . Smokeless tobacco: Current User    Types: Chew  . Alcohol Use: No   OB History   Grav Para Term Preterm Abortions TAB SAB Ect Mult Living                    Review of Systems  Unable to perform ROS: Mental status change  level 5 caveat        Allergies  Food; Penicillins; and Sulfamethoxazole-trimethoprim  Home Medications   Prior to Admission medications   Medication Sig Start Date End Date Taking? Authorizing Provider  albuterol (PROVENTIL HFA;VENTOLIN HFA) 108 (90 BASE) MCG/ACT inhaler Inhale 2 puffs into the lungs every 6 (six) hours as needed for wheezing.    Historical Provider, MD  aspirin EC 81 MG tablet Take 81 mg by mouth every morning.  05/11/12   Verne Spurr, PA-C  budesonide (PULMICORT) 0.25 MG/2ML nebulizer solution Take 0.25 mg  by nebulization 2 (two) times daily.    Historical Provider, MD  chlorproMAZINE (THORAZINE) 100 MG tablet Take 100 mg by mouth 2 times daily at 12 noon and 4 pm. AND 200 MG PO Q hs    Historical Provider, MD  divalproex (DEPAKOTE ER) 500 MG 24 hr tablet Take 1,000 mg by mouth at bedtime.    Historical Provider, MD  lamoTRIgine (LAMICTAL) 100 MG tablet Take 100 mg by mouth 2 (two) times daily.    Historical Provider, MD  levothyroxine (SYNTHROID, LEVOTHROID) 150 MCG tablet Take 150 mcg by mouth every morning. For thyroid disease. 05/11/12   Verne Spurr, PA-C  lithium 600 MG capsule Take 600 mg by mouth 2 (two) times daily with a meal.    Historical Provider, MD  Multiple Vitamin (MULTIVITAMIN) tablet Take 1 tablet by mouth daily.    Historical Provider, MD  nitroGLYCERIN  (NITROSTAT) 0.4 MG SL tablet Place 0.4 mg under the tongue every 5 (five) minutes as needed for chest pain (may repeat for 2 doses).     Historical Provider, MD  OLANZapine (ZYPREXA) 15 MG tablet Take 30 mg by mouth at bedtime.    Historical Provider, MD  omeprazole (PRILOSEC) 20 MG capsule Take 40 mg by mouth every morning.  05/11/12   Verne Spurr, PA-C  ondansetron (ZOFRAN) 4 MG tablet Take 4 mg by mouth every 8 (eight) hours as needed for nausea or vomiting.    Historical Provider, MD  polyethylene glycol (MIRALAX / GLYCOLAX) packet Take 17 g by mouth 2 (two) times a week. Tuesday and Friday    Historical Provider, MD   BP 107/68  Pulse 78  Temp(Src) 98.9 F (37.2 C) (Oral)  Resp 16  SpO2 97% Physical Exam  Nursing note and vitals reviewed. Constitutional: She appears well-developed and well-nourished. No distress.  HENT:  Head: Atraumatic.  Mouth/Throat: Oropharynx is clear and moist.  Eyes: Conjunctivae are normal. Pupils are equal, round, and reactive to light. No scleral icterus.  Neck: Normal range of motion. Neck supple. No tracheal deviation present. No thyromegaly present.  No stiffness or rigidity  Cardiovascular: Normal rate, regular rhythm, normal heart sounds and intact distal pulses.  Exam reveals no gallop and no friction rub.   No murmur heard. Pulmonary/Chest: Effort normal and breath sounds normal. No respiratory distress.  Abdominal: Soft. Normal appearance and bowel sounds are normal. She exhibits no distension and no mass. There is no tenderness. There is no rebound and no guarding.  Morbidly obese.   Genitourinary:  No cva tenderness  Musculoskeletal: She exhibits no tenderness.  Good rom bil ext without pain or focal bony tenderness.   Neurological: She is alert.  Alert.  No grossly facial asymmetry. Moves bil extremities, but doesn't follow commands. Unintelligible, babbling speech.   Skin: Skin is warm and dry. No rash noted. She is not diaphoretic.   Psychiatric:  Alert, content appearing.     ED Course  Procedures (including critical care time)  Results for orders placed during the hospital encounter of 11/05/13  CBC      Result Value Ref Range   WBC 6.6  4.0 - 10.5 K/uL   RBC 3.79 (*) 3.87 - 5.11 MIL/uL   Hemoglobin 12.7  12.0 - 15.0 g/dL   HCT 11.0  31.5 - 94.5 %   MCV 110.6 (*) 78.0 - 100.0 fL   MCH 33.5  26.0 - 34.0 pg   MCHC 30.3  30.0 - 36.0 g/dL   RDW 13.4  11.5 - 15.5 %   Platelets 214  150 - 400 K/uL  COMPREHENSIVE METABOLIC PANEL      Result Value Ref Range   Sodium 135 (*) 137 - 147 mEq/L   Potassium 4.3  3.7 - 5.3 mEq/L   Chloride 95 (*) 96 - 112 mEq/L   CO2 28  19 - 32 mEq/L   Glucose, Bld 63 (*) 70 - 99 mg/dL   BUN 12  6 - 23 mg/dL   Creatinine, Ser 1.61  0.50 - 1.10 mg/dL   Calcium 09.6 (*) 8.4 - 10.5 mg/dL   Total Protein 7.0  6.0 - 8.3 g/dL   Albumin 3.4 (*) 3.5 - 5.2 g/dL   AST 12  0 - 37 U/L   ALT 10  0 - 35 U/L   Alkaline Phosphatase 118 (*) 39 - 117 U/L   Total Bilirubin 0.2 (*) 0.3 - 1.2 mg/dL   GFR calc non Af Amer >90  >90 mL/min   GFR calc Af Amer >90  >90 mL/min  URINALYSIS, ROUTINE W REFLEX MICROSCOPIC      Result Value Ref Range   Color, Urine AMBER (*) YELLOW   APPearance CLOUDY (*) CLEAR   Specific Gravity, Urine 1.027  1.005 - 1.030   pH 6.5  5.0 - 8.0   Glucose, UA NEGATIVE  NEGATIVE mg/dL   Hgb urine dipstick NEGATIVE  NEGATIVE   Bilirubin Urine LARGE (*) NEGATIVE   Ketones, ur >80 (*) NEGATIVE mg/dL   Protein, ur 30 (*) NEGATIVE mg/dL   Urobilinogen, UA 1.0  0.0 - 1.0 mg/dL   Nitrite NEGATIVE  NEGATIVE   Leukocytes, UA TRACE (*) NEGATIVE  LITHIUM LEVEL      Result Value Ref Range   Lithium Lvl 3.44 (*) 0.80 - 1.40 mEq/L  URINE MICROSCOPIC-ADD ON      Result Value Ref Range   Squamous Epithelial / LPF RARE  RARE   WBC, UA 3-6  <3 WBC/hpf   RBC / HPF 0-2  <3 RBC/hpf   Bacteria, UA FEW (*) RARE   Casts HYALINE CASTS (*) NEGATIVE   Urine-Other MUCOUS PRESENT    CBG  MONITORING, ED      Result Value Ref Range   Glucose-Capillary 79  70 - 99 mg/dL   Ct Head Wo Contrast  11/05/2013   CLINICAL DATA:  Pain  EXAM: CT HEAD WITHOUT CONTRAST  TECHNIQUE: Contiguous axial images were obtained from the base of the skull through the vertex without intravenous contrast.  COMPARISON:  Prior CT from 07/28/2012  FINDINGS: Study is degraded by motion artifact.  There is no acute intracranial hemorrhage or infarct. No mass lesion or midline shift. Gray-white matter differentiation is well maintained. Ventricles are normal in size without evidence of hydrocephalus. CSF containing spaces are within normal limits. No extra-axial fluid collection.  The calvarium is intact.  Orbital soft tissues are within normal limits.  The paranasal sinuses and mastoid air cells are well pneumatized and free of fluid.  Scalp soft tissues are unremarkable.  IMPRESSION: No acute intracranial abnormality.   Electronically Signed   By: Rise Mu M.D.   On: 11/05/2013 18:01       EKG Interpretation   Date/Time:  Tuesday Nov 05 2013 16:27:56 EDT Ventricular Rate:  82 PR Interval:  180 QRS Duration: 105 QT Interval:  389 QTC Calculation: 454 R Axis:   82 Text Interpretation:  Sinus rhythm Borderline right axis deviation  Nonspecific ST abnormality No significant change since  last tracing  Confirmed by Denton LankSTEINL  MD, Caryn BeeKEVIN ([id number]) on 11/05/2013 4:34:54 PM      MDM   Iv ns. Labs.  Reviewed nursing notes and prior charts for additional history.   Lithium level very high ?chronic toxicity, symptoms present x days.  No report of seizures.  Pt alert appearing, not lethargic or obtunded, renal fxn normal.  Iv ns bolus. Med service contacted for admission.    Suzi RootsKevin E Marieke Lubke, MD 11/05/13 [phone]

## 2013-11-05 NOTE — Assessment & Plan Note (Addendum)
Pt is moderately obtunded but is trying to speak and looks like she is trying to wake up but can't; normally she is awake and despite her psych dx has always made sense to me and able to assist with exams etc. This is reported to be worse today. Lithium toxicity and Na+ problems spring immediately to mind as does depakote levels and the usual players-dehydration, which pt most certainly has now since she last reported drinking last night except for a cup of water with meds today, and the usual infections. Will send to ED.

## 2013-11-05 NOTE — Progress Notes (Signed)
MRN: [id number] Name: [name]  Sex: female Age: 63 y.o. DOB: 1950-12-10  PSC #: Ronni Rumble Facility/Room: 111 Level Of Care: SNF Provider: Margit Hanks Emergency Contacts: Extended Emergency Contact Information Primary Emergency Contact: Maxwell,Clarance Address: [address]          [place], [place] [address] [place] Phone: [phone] Relation: Friend Secondary Emergency Contact: Virl Son, [place] Mobile Phone: [phone] Relation: Friend  Code Status: FULL  Allergies: Food; Penicillins; and Sulfamethoxazole-trimethoprim  Chief Complaint  Patient presents with  . Acute Visit    HPI: Patient is 63 y.o. female who is being seen for decreased MS since yesterday.  Past Medical History  Diagnosis Date  . Schizoaffective disorder   . Urinary tract infection   . Hypertension   . GERD (gastroesophageal reflux disease)   . Hypothyroidism   . Obesity   . Asthma   . COPD (chronic obstructive pulmonary disease)   . CHF (congestive heart failure)   . Seizures   . Morbid obesity   . Chronic pain   . Hypercholesteremia   . Anginal pain   . Myocardial infarction 2002  . Diabetes mellitus     "I'm not diabetic anymore" (05/23/2012)  . Anemia   . History of blood transfusion 1993    "when I had my hysterectomy" (05/23/2012)  . H/O hiatal hernia   . Daily headache   . Arthritis     "severe; all over my body" (05/23/2012)  . Hypoventilation syndrome     [name] 05/30/2012  . Atrial fibrillation   . Atrial flutter, paroxysmal     [name] 05/30/2012  . Exertional dyspnea   . Shortness of breath     "all the time lately" (05/30/2012)  . Pneumonia     "several times; including now" (04/23/2013)  . Anxiety   . Episodic mood disorder   . Psychosis   . Schizophrenia     Past Surgical History  Procedure Laterality Date  . Tubal ligation  1998  . Cholecystectomy  ?2008  . Cardiac  catheterization  2008  . Vaginal hysterectomy  1993      Medication List    Notice   This visit is during an admission. Changes to the med list made in this visit will be reflected in the After Visit Summary of the admission.      No orders of the defined types were placed in this encounter.    Immunization History  Administered Date(s) Administered  . Influenza Split 03/29/2012  . Influenza Whole 05/27/2009, 02/18/2010  . Influenza,inj,Quad PF,36+ Mos 04/24/2013  . Pneumococcal Polysaccharide-23 05/20/2009, 03/29/2012  . Td 05/27/2009    History  Substance Use Topics  . Smoking status: Former Smoker -- 2.00 packs/day for 12 years    Types: Cigarettes    Quit date: 06/29/2000  . Smokeless tobacco: Current User    Types: Chew  . Alcohol Use: No    Review of Systems  DATA OBTAINED: from nurse GENERAL:  no fevers SKIN: No itching, rash HEENT: No complaint RESPIRATORY: No cough, wheezing, SOB CARDIAC: No lower extremity edema  GI: No abdominal pain, No N/V/D or constipation; only a cup of water since last night and didn't drink that well yesterday GU: + incontinence chronic NEUROLOGIC: decreased responsiveness starting yesterday, worse today PSYCHIATRIC: no behavoirs   Filed Vitals:   11/05/13 1714  BP: 112/60  Pulse: 61  Temp: 98 F (36.7 C)  Resp: 20    Physical Exam  GENERAL APPEARANCE: obese, obtunded, moderate  SKIN: h/o healing rash on back but did not turn pt over HEENT: Unremarkable RESPIRATORY: Breathing is even, unlabored. Lung sounds are clear  O2 sat per ADAMD at bedside 96% RA with a pulse of 80 CARDIOVASCULAR: Heart RRR no murmurs, rubs or gallops. No peripheral edema  GASTROINTESTINAL: Abdomen is soft, non-tender, not distended w/ normal bowel sounds.  GENITOURINARY: Bladder non tender, not distended  MUSCULOSKELETAL: No abnormal joints or musculature NEUROLOGIC: pt is trying to speak to me in response to my questions but is mumbling,  halfway under; no focal findings   Patient Active Problem List   Diagnosis Date Noted  . Lithium toxicity 11/05/2013  . Rash of back 10/15/2013  . Schizophrenia 07/05/2013  . Psychosis 07/05/2013  . Urinary tract infection, site not specified 04/29/2013  . Schizo-affective psychosis 04/19/2013  . COPD (chronic obstructive pulmonary disease) 04/19/2013  . Dyspnea and respiratory abnormality 04/19/2013  . Constipation 12/11/2012  . Hypercapnic respiratory failure, chronic 09/02/2012  . Altered mental status 07/29/2012  . Tachycardia 03/30/2012  . Hypertension 03/28/2012  . Atrial fibrillation 03/28/2012  . Hypothyroidism 03/28/2012  . Diabetes mellitus 03/28/2012  . Hypotension 03/28/2012  . Acute respiratory failure with hypoxia 03/28/2012  . Morbid obesity   . Chronic pain   . Nausea 01/26/2012  . Diarrhea 01/26/2012  . Chest pain 01/11/2012  . Hyponatremia 01/11/2012  . Healthcare-associated pneumonia 01/11/2012  . Community acquired pneumonia 07/29/2011  . Suicidal ideations 07/29/2011  . Schizoaffective disorder, bipolar type 06/20/2011  . ANEMIA 07/13/2010  . CHEST PAIN 07/13/2010  . OBSTRUCTIVE SLEEP APNEA 01/22/2010  . CHOLELITHIASIS 11/30/2009  . MYOCARDIAL INFARCTION 11/17/2009  . ASTHMA 11/17/2009  . INSOMNIA 11/17/2009  . DEGENERATIVE JOINT DISEASE, KNEES, BILATERAL 05/27/2009  . URINALYSIS, ABNORMAL 05/27/2009  . OBESITY 03/26/2009  . DYSPNEA ON EXERTION 08/12/2008  . LOW BACK PAIN SYNDROME 12/11/2007  . HYPOTHYROIDISM NOS 02/06/2007  . DEFICIENCY, B-COMPLEX NEC 02/06/2007  . RETARDATION, MENTAL, MILD 02/06/2007  . HYPERTENSION, BENIGN ESSENTIAL 02/06/2007  . GERD 02/06/2007  . CHEST PAIN, HX OF 02/21/2006  . TARDIVE DYSKINESIA 08/10/2004    CBC    Component Value Date/Time   WBC 7.3 11/07/2013 0424   RBC 3.58* 11/07/2013 0424   RBC 2.70* 05/19/2011 0632   HGB 12.0 11/07/2013 0424   HCT 39.5 11/07/2013 0424   PLT 183 11/07/2013 0424   MCV 110.3*  11/07/2013 0424   LYMPHSABS 2.2 04/24/2013 0805   MONOABS 0.6 04/24/2013 0805   EOSABS 0.1 04/24/2013 0805   BASOSABS 0.0 04/24/2013 0805    CMP     Component Value Date/Time   NA 140 11/08/2013 0800   K 4.6 11/08/2013 0800   CL 102 11/08/2013 0800   CO2 27 11/08/2013 0800   GLUCOSE 91 11/08/2013 0800   BUN 7 11/08/2013 0800   CREATININE 0.54 11/08/2013 0800   CALCIUM 11.4* 11/08/2013 0800   PROT 6.0 11/06/2013 0411   ALBUMIN 2.8* 11/06/2013 0411   AST 12 11/06/2013 0411   ALT 9 11/06/2013 0411   ALKPHOS 103 11/06/2013 0411   BILITOT <0.2* 11/06/2013 0411   GFRNONAA >90 11/08/2013 0800   GFRAA >90 11/08/2013 0800    Assessment and Plan  Altered mental status Pt is moderately obtunded but is trying to speak and looks like she is trying to wake up but can't; normally she is awake and despite her psych dx  has always made sense to me and able to assist with exams etc. This is reported to be worse today. Lithium toxicity and Na+ problems spring immediately to mind as does depakote levels and the usual players-dehydration, which pt most certainly has now since she last reported drinking last night except for a cup of water with meds today, and the usual infections. Will send to ED.    Margit Hanks, MD

## 2013-11-05 NOTE — Progress Notes (Signed)
Pt was hypotensive (sys 80s) when she came to the floor from the ED. 500 ml NS bolus was ordered by hospitalist and given. Pt's BP responded to bolus (sys 90s). Assessed BP 2 hours later when bringing nighttime medicine. BP was hypotensive in 80s again. Paged hospitalist who ordered 500 ml NS bolus. Informed MD of CHF hx.  While attempting to give patient her pills patient became inconsolable and was screaming and moaning. RN attempted to calm patient and give her pills, but the patient spit them out. Pt stopped screaming and RN attempted again to give PO meds. Pt spit pills out and began screaming again. Will monitor patient.

## 2013-11-05 NOTE — ED Notes (Signed)
Bed: WA17 Expected date:  Expected time:  Means of arrival:  Comments: EMS- AMS x 1 week

## 2013-11-05 NOTE — ED Notes (Signed)
Patient drank sprite without any problems

## 2013-11-06 DIAGNOSIS — R4182 Altered mental status, unspecified: Secondary | ICD-10-CM

## 2013-11-06 DIAGNOSIS — D649 Anemia, unspecified: Secondary | ICD-10-CM

## 2013-11-06 DIAGNOSIS — T65891A Toxic effect of other specified substances, accidental (unintentional), initial encounter: Secondary | ICD-10-CM

## 2013-11-06 DIAGNOSIS — T56894A Toxic effect of other metals, undetermined, initial encounter: Secondary | ICD-10-CM

## 2013-11-06 DIAGNOSIS — E119 Type 2 diabetes mellitus without complications: Secondary | ICD-10-CM

## 2013-11-06 LAB — COMPREHENSIVE METABOLIC PANEL
ALK PHOS: 103 U/L (ref 39–117)
ALT: 9 U/L (ref 0–35)
AST: 12 U/L (ref 0–37)
Albumin: 2.8 g/dL — ABNORMAL LOW (ref 3.5–5.2)
BUN: 11 mg/dL (ref 6–23)
CHLORIDE: 103 meq/L (ref 96–112)
CO2: 25 mEq/L (ref 19–32)
Calcium: 10.4 mg/dL (ref 8.4–10.5)
Creatinine, Ser: 0.57 mg/dL (ref 0.50–1.10)
GFR calc non Af Amer: >90 mL/min (ref >90)
GLUCOSE: 62 mg/dL — AB (ref 70–99)
POTASSIUM: 4.4 meq/L (ref 3.7–5.3)
Sodium: 138 mEq/L (ref 137–147)
TOTAL PROTEIN: 6 g/dL (ref 6.0–8.3)

## 2013-11-06 LAB — CBC
HCT: 38.4 % (ref 36.0–46.0)
Hemoglobin: 11.8 g/dL — ABNORMAL LOW (ref 12.0–15.0)
MCH: 34 pg (ref 26.0–34.0)
MCHC: 30.7 g/dL (ref 30.0–36.0)
MCV: 110.7 fL — ABNORMAL HIGH (ref 78.0–100.0)
Platelets: 184 10*3/uL (ref 150–400)
RBC: 3.47 MIL/uL — AB (ref 3.87–5.11)
RDW: 13.5 % (ref 11.5–15.5)
WBC: 6.4 10*3/uL (ref 4.0–10.5)

## 2013-11-06 LAB — LITHIUM LEVEL: LITHIUM LVL: 3.13 meq/L — AB (ref 0.80–1.40)

## 2013-11-06 LAB — GLUCOSE, CAPILLARY
GLUCOSE-CAPILLARY: 57 mg/dL — AB (ref 70–99)
GLUCOSE-CAPILLARY: 70 mg/dL (ref 70–99)

## 2013-11-06 MED ORDER — CHLORPROMAZINE HCL 100 MG PO TABS
100.0000 mg | ORAL_TABLET | Freq: Two times a day (BID) | ORAL | Status: DC
  Filled 2013-11-06 (×2): qty 1

## 2013-11-06 MED ORDER — VALPROIC ACID 250 MG/5ML PO SYRP
1000.0000 mg | ORAL_SOLUTION | Freq: Every day | ORAL | Status: DC
  Administered 2013-11-06: 1000 mg via ORAL
  Filled 2013-11-06 (×2): qty 20

## 2013-11-06 MED ORDER — RESOURCE THICKENUP CLEAR PO POWD
ORAL | Status: DC | PRN
  Administered 2013-11-07: 23:00:00 via ORAL
  Filled 2013-11-06: qty 125

## 2013-11-06 NOTE — Progress Notes (Signed)
Pt appears less confused this AM. Pt was able to help turn during bath and follow commands while the tech completed vitals. Pt was still agitated, but less so than earlier. We were able to turn her without her screaming and attempting to remove the pillow. Will continue to monitor.  Thersa Salt, RN, BSN 6:13 AM 11/06/2013

## 2013-11-06 NOTE — Care Management Note (Addendum)
    Page 1 of 1   11/08/2013     4:01:23 PM CARE MANAGEMENT NOTE 11/08/2013  Patient:  [name], [name]   Account Number:  [id number]  Date Initiated:  11/06/2013  Documentation initiated by:  Lanier Clam  Subjective/Objective Assessment:   62 Y/O F ADMITTED W/LITHIUM TOXICITY.XK:PVVZSMO AFFECTIVE D/O.     Action/Plan:   FROM SNF-[facility].   Anticipated DC Date:  11/08/2013   Anticipated DC Plan:  SKILLED NURSING FACILITY      DC Planning Services  CM consult      Choice offered to / List presented to:             Status of service:  Completed, signed off Medicare Important Message given?  NA - LOS <3 / Initial given by admissions (If response is "NO", the following Medicare IM given date fields will be blank) Date Medicare IM given:  11/08/2013 Date Additional Medicare IM given:    Discharge Disposition:  SKILLED NURSING FACILITY  Per UR Regulation:  Reviewed for med. necessity/level of care/duration of stay  If discussed at Long Length of Stay Meetings, dates discussed:    Comments:  11/08/13 [facility] RN,BSN NCM [phone] D/C SNF.  11/06/13 Aedin Jeansonne RN,BSN NCM [phone] PSYCH CONS.

## 2013-11-06 NOTE — Progress Notes (Addendum)
[facility] PROGRESS NOTE  Jennifer SnipesBetty T [name] WUJ:[id number] DOB: 1950/08/09 DOA: 11/05/2013 PCP: Bufford SpikesEED, TIFFANY, DO  Assessment/Plan: 1. Lithium toxicity  -Lithium on hold, repeat level this am and 5/21  -no indication for HD  -hydrate, supportive care  -repeat level in am  -monitor on tele-no events so far  2. Hypothyroidism  -continue with her thyroid supplementations  -FU TSH level   3. Hypertension  -stable, not on meds  4. Asthma  -continue albuterol PRN -stable  5. Atrial Fibrillation  -currently in sinus rhythymn  6. Schizoaffective disorder/suspect she has cognitive dysfunction too - i called her SNF RN who reports that at baseline with confusion, intermittent lethargy and some slurring of speech -on depakote/zyprexa -will request Psych eval for med mgt  7. Bed bound status -at baseline  Code Status: Full Code Family Communication: none at bedside, called and d[facility] Disposition Plan: back to SNF   Consultants:  Psych  HPI/Subjective: Speech slurred this am Confused,   Objective: Filed Vitals:   11/06/13 0500  BP: 94/72  Pulse: 82  Temp: 98.2 F (36.8 C)  Resp: 18    Intake/Output Summary (Last 24 hours) at 11/06/13 1211 Last data filed at 11/06/13 [id number]  Gross per 24 hour  Intake   3165 ml  Output      0 ml  Net   3165 ml   Filed Weights   11/05/13 1951  Weight: 136.8 kg (301 lb 9.4 oz)    Exam:   General: AAOx to self, person, intermittently confused, no distress  Cardiovascular: S1S2/RRR  Respiratory: CTAB  Abdomen: soft, Nt, BS present  Musculoskeletal: no edema c/c   Neuro: speech intermittently slurred, moves upper extremities, but will not move lower ext to command  Data Reviewed: Basic Metabolic Panel:  Recent Labs Lab 11/05/13 1640 11/06/13 0411  NA 135* 138  K 4.3 4.4  CL 95* 103  CO2 28 25  GLUCOSE 63* 62*  BUN 12 11  CREATININE 0.67 0.57  CALCIUM 11.4* 10.4    Liver Function Tests:  Recent Labs Lab 11/05/13 1640 11/06/13 0411  AST 12 12  ALT 10 9  ALKPHOS 118* 103  BILITOT 0.2* <0.2*  PROT 7.0 6.0  ALBUMIN 3.4* 2.8*   No results found for this basename: LIPASE, AMYLASE,  in the last 168 hours No results found for this basename: AMMONIA,  in the last 168 hours CBC:  Recent Labs Lab 11/05/13 1640 11/06/13 0411  WBC 6.6 6.4  HGB 12.7 11.8*  HCT 41.9 38.4  MCV 110.6* 110.7*  PLT 214 184   Cardiac Enzymes: No results found for this basename: CKTOTAL, CKMB, CKMBINDEX, TROPONINI,  in the last 168 hours BNP (last 3 results) No results found for this basename: PROBNP,  in the last 8760 hours CBG:  Recent Labs Lab 11/05/13 1805 11/06/13 0733 11/06/13 0816  GLUCAP 79 57* 70    Recent Results (from the past 240 hour(s))  MRSA PCR SCREENING     Status: None   Collection Time    11/05/13  7:38 PM      Result Value Ref Range Status   MRSA by PCR NEGATIVE  NEGATIVE Final   Comment:            The GeneXpert MRSA Assay (FDA     approved for NASAL specimens     only), is one component of a     comprehensive MRSA colonization     surveillance program.  It is not     intended to diagnose MRSA     infection nor to guide or     monitor treatment for     MRSA infections.     Studies: Ct Head Wo Contrast  11/05/2013   CLINICAL DATA:  Pain  EXAM: CT HEAD WITHOUT CONTRAST  TECHNIQUE: Contiguous axial images were obtained from the base of the skull through the vertex without intravenous contrast.  COMPARISON:  Prior CT from 07/28/2012  FINDINGS: Study is degraded by motion artifact.  There is no acute intracranial hemorrhage or infarct. No mass lesion or midline shift. Gray-white matter differentiation is well maintained. Ventricles are normal in size without evidence of hydrocephalus. CSF containing spaces are within normal limits. No extra-axial fluid collection.  The calvarium is intact.  Orbital soft tissues are within normal  limits.  The paranasal sinuses and mastoid air cells are well pneumatized and free of fluid.  Scalp soft tissues are unremarkable.  IMPRESSION: No acute intracranial abnormality.   Electronically Signed   By: Rise Mu M.D.   On: 11/05/2013 18:01    Scheduled Meds: . antiseptic oral rinse  15 mL Mouth Rinse q12n4p  . aspirin EC  81 mg Oral q morning - 10a  . budesonide  0.25 mg Nebulization BID  . chlorproMAZINE  100 mg Oral BID  . chlorproMAZINE  200 mg Oral QHS  . divalproex  1,000 mg Oral QHS  . docusate sodium  100 mg Oral BID  . folic acid  1 mg Oral Daily  . heparin  5,000 Units Subcutaneous 3 times per day  . lamoTRIgine  100 mg Oral BID  . levothyroxine  150 mcg Oral q morning - 10a  . multivitamin with minerals  1 tablet Oral Daily  . OLANZapine  30 mg Oral QHS  . pantoprazole  40 mg Oral Daily  . sodium chloride  3 mL Intravenous Q12H  . thiamine  100 mg Oral Daily   Continuous Infusions: . sodium chloride 100 mL/hr at 11/06/13 9233   Antibiotics Given (last 72 hours)   None      Principal Problem:   Lithium toxicity Active Problems:   HYPOTHYROIDISM NOS   HYPERTENSION, BENIGN ESSENTIAL   ASTHMA   Atrial fibrillation    Time spent:    Zannie Cove  [facility] Pager [phone]. If 7PM-7AM, please contact night-coverage at [email or web address], password [facility] 11/06/2013, 12:11 PM  LOS: 1 day

## 2013-11-06 NOTE — Progress Notes (Signed)
CRITICAL VALUE ALERT  Critical value received:  Lithium 3.13  Date of notification:  11/06/13  Time of notification:  0932  Critical value read back:yes  Nurse who received alert:  CCM  MD notified (1st page):  Dr Jomarie Longs  Time of first page:  0935  MD notified (2nd page):  Time of second page:  Responding MD:  Dr. Jomarie Longs   Time MD responded:  [phone]

## 2013-11-06 NOTE — Progress Notes (Signed)
Hypoglycemic Event  CBG: 57  Treatment: 15 GM carbohydrate snack  Symptoms: None  Follow-up CBG: Time:0800 CBG Result:70  Possible Reasons for Event: Inadequate meal intake  Comments/MD notified:Dr. Demetrius Charity Benjiman Core  Remember to initiate Hypoglycemia Order Set & completeAdult Hypoglycemia Protocol Treatment Guidelines  1. RN shall initiate Hypoglycemia Protocol emergency measures immediately when:            w        Routine or STAT CBG and/or a lab glucose indicates hypoglycemia (CBG < 70 mg/dl)  2. Treat the patient according to ability to take PO's and severity of hypoglycemia.   3. If patient is on GlucoStabilizer, follow directions provided by the [facility] for hypoglycemic events.  4. If patient on insulin pump, follow Hypoglycemia Protocol.  If patient requires more than one treatment have patient place pump in SUSPEND and notify MD.  DO NOT leave pump in SUSPEND for greater than 30 minutes unless ordered by MD.  A. Treatment for Mild or Moderate-Patient cooperative and able to swallow    1.  Patient taking PO's and can cooperate   a.  Give one of the following 15 gram CHO options:                           w     1 tube oral dextrose gel                           w     3-4 Glucose tablets                           w     4 oz. Juice                           w     4 oz. regular soda                                    ESRD patients:  clear, regular soda                           w     8 oz. skim milk    b.  Recheck CBG in 15 minutes after treatment                            w       If CBG < 70 mg/dl, repeat treatment and recheck until hypoglycemia is resolved                            w       If CBG > 70 mg/dl and next meal is more than 1 hour away, give additional 15 grams CHO   2.  Patient NPO-Patient cooperative and no altered mental status    a.  Give 25 ml of D50 IV.   b.  Recheck CBG in 15 minutes after treatment.                              w  If CBG is less than 70 mg/dl, repeat treatment and recheck until hypoglycemia is resolved.   c.  Notify MD for further orders.             SPECIAL CONSIDERATIONS:    a.  If no IV access,                              w        Start IV of D5W at [facility]                             w        Give 25 ml of D50 IV.    b.  If unable to gain IV access                             w          Give Glucagon IM:     i.  1 mg if patient weighs more than 45.5 kg     ii.  0.5 mg if patient weighs less than 45.5 kg   c.  Notify MD for further orders  B. Treatment for Severe-- Patient unconscious or unable to take PO's safely    1.  Position patient on side   2.  Give 50 ml D50 IV   3.  Recheck CBG in 15 minutes.                    w      If CBG is less than 70 mg/dl, repeat treatment and recheck until hypoglycemia is resolved.   4.  Notify MD for further orders.    SPECIAL CONSIDERATIONS:    a.  If no IV access                              w     Give Glucagon IM                                              i.  1 mg if patient weighs more than 45.5 kg                                             ii.  0.5 mg if patient weighs less than 45.5 kg                              w      Start IV of D5W at 50 ml/hr and give 50 ml D50 IV   b.  If no IV access and active seizure                               w       Call Rapid Response   c.  If unable to gain IV access, give Glucagon IM:  w          1 mg if patient weighs more than 45.5 kg                              w          0.5 mg if patient weighs less than 45.5 kg   d.  Notify MD for further orders.  C. Complete smart text progress note to document intervention and follow-up CBG   1. In [facility] patient chart, click on Notes (left side of screen)   2. Create Progress Note   3. Click on [employer].  In the Match box type "hypo" and enter    4. Double click on CHL IP HYPOGLYCEMIC EVENT and enter  data   5. MD must be notified if patient is NPO or experienced severe hypoglycemia

## 2013-11-06 NOTE — Evaluation (Signed)
Clinical/Bedside Swallow Evaluation Patient Details  Name: [name] MRN: [id number] Date of Birth: 06/20/1951  Today's Date: 11/06/2013 Time: 0300-9233 SLP Time Calculation (min): 48 min  Past Medical History:  Past Medical History  Diagnosis Date  . Schizoaffective disorder   . Urinary tract infection   . Hypertension   . GERD (gastroesophageal reflux disease)   . Hypothyroidism   . Obesity   . Asthma   . COPD (chronic obstructive pulmonary disease)   . CHF (congestive heart failure)   . Seizures   . Morbid obesity   . Chronic pain   . Hypercholesteremia   . Anginal pain   . Myocardial infarction 2002  . Diabetes mellitus     "I'm not diabetic anymore" (05/23/2012)  . Anemia   . History of blood transfusion 1993    "when I had my hysterectomy" (05/23/2012)  . H/O hiatal hernia   . Daily headache   . Arthritis     "severe; all over my body" (05/23/2012)  . Hypoventilation syndrome     [name] 05/30/2012  . Atrial fibrillation   . Atrial flutter, paroxysmal     [name] 05/30/2012  . Exertional dyspnea   . Shortness of breath     "all the time lately" (05/30/2012)  . Pneumonia     "several times; including now" (04/23/2013)  . Anxiety   . Episodic mood disorder   . Psychosis   . Schizophrenia    Past Surgical History:  Past Surgical History  Procedure Laterality Date  . Tubal ligation  1998  . Cholecystectomy  ?2008  . Cardiac catheterization  2008  . Vaginal hysterectomy  1993   HPI:  63 yo female adm to [facility] with AMS due to lithium toxicity.  Pt with PMH + for hiatal hernia, schizoaffective disorder, tardive dyskinesia, GERD.  Pt resides at [facility] and was consuming a regular/thin diet prior to admission per SNF SLP review of chart.   Head CT negative for acute change.  Swallow evaluation ordered due to pt having difficulty swallowing at breakfast today with anterior spillage and oral holding.     Assessment / Plan / Recommendation Clinical  Impression  Moderate oral dysphagia likely due to tardive dyskinesea with exacerbation from mental status/medical issue, suspect pharyngeal swallow intact based on clinical bedside evaluation.  Oral-lingual weakness and decreased labial seal results in lingual protrusion with swallowing and right anterior spillage.  Multiple swallows suspected due to piecemealing.  Suspect aspiration of thin due to premature spillage into pharynx/larynx.  Good tolerance of thin via tsp, nectar via cup and tsps pureed.  SLP did not test solids due to level of pt's dysarthria.    Recommend puree/nectar and tsps thin soda with strict precautions. MD please assess lingual musculature as whitish coating noted, ? if could be consistent with thrush or xerostomic from mouth breathing.  SLP to follow up for tolerance and indication for dietary advancement.        Aspiration Risk    mild   Diet Recommendation Dysphagia 1 (Puree);Nectar-thick liquid (thin via tsp)   Liquid Administration via: No straw;Cup;Spoon Medication Administration: Crushed with puree Supervision: Full supervision/cueing for compensatory strategies;Staff to assist with self feeding Compensations: Slow rate;Small sips/bites;Check for pocketing;Check for anterior loss Postural Changes and/or Swallow Maneuvers: Seated upright 90 degrees;Upright 30-60 min after meal    Other  Recommendations Oral Care Recommendations: Oral care BID Other Recommendations: Order thickener from pharmacy   Follow Up Recommendations  Skilled Nursing facility  Frequency and Duration min 2x/week  2 weeks   Pertinent Vitals/Pain Afebrile, decreased     Swallow Study Prior Functional Status   see hhx    General Date of Onset: 11/06/13 HPI: 63 [facility] female [facility] due to lithium toxicity.  Pt with PMH + for hiatal hernia, schizoaffective disorder, tardive dyskinesia, GERD.  [facility] and was consuming a regular/thin diet prior to admission  per SNF SLP review of chart.   Head CT negative for acute change.  Swallow evaluation ordered due to pt having difficulty swallowing at breakfast today with anterior spillage and oral holding.   Type of Study: Bedside swallow evaluation Diet Prior to this Study: NPO Temperature Spikes Noted: No Respiratory Status: Nasal cannula History of Recent Intubation: No Behavior/Cognition: Alert;Decreased sustained attention Oral Cavity - Dentition: Edentulous Self-Feeding Abilities: Total assist Patient Positioning: Upright in bed Baseline Vocal Quality: Low vocal intensity Volitional Cough: Cognitively unable to elicit Volitional Swallow: Unable to elicit    Oral/Motor/Sensory Function Overall Oral Motor/Sensory Function: Impaired (pt appears with discoordinated oral transiting, speech very dysarthric, weakness)   Ice Chips Ice chips: Impaired Presentation: Spoon Oral Phase Impairments: Reduced lingual movement/coordination;Impaired anterior to posterior transit;Reduced labial seal Oral Phase Functional Implications: Prolonged oral transit Pharyngeal Phase Impairments: Suspected delayed Swallow   Thin Liquid Thin Liquid: Impaired Presentation: Cup;Spoon Oral Phase Impairments: Reduced lingual movement/coordination;Impaired anterior to posterior transit;Reduced labial seal Oral Phase Functional Implications: Right anterior spillage Pharyngeal  Phase Impairments: Suspected delayed Swallow;Cough - Immediate;Multiple swallows Other Comments: cough immediately after swallow via cup, adequate tolerance via tsp    Nectar Thick Nectar Thick Liquid: Impaired Presentation: Cup;Straw;Spoon Oral Phase Impairments: Impaired anterior to posterior transit;Reduced lingual movement/coordination;Reduced labial seal Oral phase functional implications: Right anterior spillage;Prolonged oral transit Pharyngeal Phase Impairments: Suspected delayed Swallow;Multiple swallows   Honey Thick Honey Thick Liquid: Not  tested   Puree Puree: Impaired Presentation: Spoon Oral Phase Impairments: Reduced labial seal;Reduced lingual movement/coordination;Impaired anterior to posterior transit Oral Phase Functional Implications: Prolonged oral transit Pharyngeal Phase Impairments: Suspected delayed Swallow;Multiple swallows   Solid   GO    Solid: Not tested       Jennifer Burnetamara Vipul Cafarelli, MS [facility] S[facility] [phone]

## 2013-11-06 NOTE — Clinical Social Work Note (Signed)
CSW advised today that patient was admitted from [facility]- full assessment to follow-  Reece Levy, MSW, Theresia Majors [phone]

## 2013-11-07 DIAGNOSIS — G8929 Other chronic pain: Secondary | ICD-10-CM

## 2013-11-07 DIAGNOSIS — I4891 Unspecified atrial fibrillation: Secondary | ICD-10-CM

## 2013-11-07 DIAGNOSIS — R404 Transient alteration of awareness: Secondary | ICD-10-CM

## 2013-11-07 DIAGNOSIS — K5900 Constipation, unspecified: Secondary | ICD-10-CM

## 2013-11-07 DIAGNOSIS — F3160 Bipolar disorder, current episode mixed, unspecified: Secondary | ICD-10-CM

## 2013-11-07 LAB — CBC
HCT: 39.5 % (ref 36.0–46.0)
Hemoglobin: 12 g/dL (ref 12.0–15.0)
MCH: 33.5 pg (ref 26.0–34.0)
MCHC: 30.4 g/dL (ref 30.0–36.0)
MCV: 110.3 fL — ABNORMAL HIGH (ref 78.0–100.0)
Platelets: 183 10*3/uL (ref 150–400)
RBC: 3.58 MIL/uL — ABNORMAL LOW (ref 3.87–5.11)
RDW: 13.4 % (ref 11.5–15.5)
WBC: 7.3 10*3/uL (ref 4.0–10.5)

## 2013-11-07 LAB — BASIC METABOLIC PANEL
BUN: 8 mg/dL (ref 6–23)
CALCIUM: 10.8 mg/dL — AB (ref 8.4–10.5)
CO2: 26 mEq/L (ref 19–32)
Chloride: 105 mEq/L (ref 96–112)
Creatinine, Ser: 0.52 mg/dL (ref 0.50–1.10)
Glucose, Bld: 72 mg/dL (ref 70–99)
Potassium: 4.3 mEq/L (ref 3.7–5.3)
Sodium: 140 mEq/L (ref 137–147)

## 2013-11-07 LAB — GLUCOSE, CAPILLARY
GLUCOSE-CAPILLARY: 73 mg/dL (ref 70–99)
Glucose-Capillary: 77 mg/dL (ref 70–99)

## 2013-11-07 LAB — LITHIUM LEVEL: Lithium Lvl: 2.31 mEq/L (ref 0.80–1.40)

## 2013-11-07 MED ORDER — CHLORPROMAZINE HCL 50 MG PO TABS
50.0000 mg | ORAL_TABLET | Freq: Two times a day (BID) | ORAL | Status: DC
  Administered 2013-11-07 – 2013-11-08 (×3): 50 mg via ORAL
  Filled 2013-11-07 (×4): qty 1

## 2013-11-07 MED ORDER — VALPROIC ACID 250 MG/5ML PO SYRP
750.0000 mg | ORAL_SOLUTION | Freq: Every day | ORAL | Status: DC
Start: 2013-11-07 — End: 2013-11-07

## 2013-11-07 MED ORDER — VALPROIC ACID 250 MG/5ML PO SYRP
1000.0000 mg | ORAL_SOLUTION | Freq: Every day | ORAL | Status: DC
  Administered 2013-11-07: 1000 mg via ORAL
  Filled 2013-11-07 (×2): qty 20

## 2013-11-07 MED ORDER — HYDROCODONE-ACETAMINOPHEN 5-325 MG PO TABS: 1.0000 | ORAL_TABLET | Freq: Four times a day (QID) | ORAL | Status: DC | PRN

## 2013-11-07 MED ORDER — CHLORPROMAZINE HCL 50 MG PO TABS
100.0000 mg | ORAL_TABLET | Freq: Every day | ORAL | Status: DC
  Administered 2013-11-07: 100 mg via ORAL
  Filled 2013-11-07 (×2): qty 2

## 2013-11-07 NOTE — Progress Notes (Signed)
Clinical Social Work Department CLINICAL SOCIAL WORK PSYCHIATRY SERVICE LINE ASSESSMENT 11/07/2013  Patient:  [name]  Account:  [id number]  Admit Date:  11/05/2013  Clinical Social Worker:  Sindy Messing, LCSW  Date/Time:  11/07/2013 02:00 PM Referred by:  Physician  Date referred:  11/07/2013 Reason for Referral  Psychosocial assessment   Presenting Symptoms/Problems (In the person's/family's own words):   Psych consulted due to lithium toxicity.   Abuse/Neglect/Trauma History (check all that apply)  Denies history   Abuse/Neglect/Trauma Comments:   Psychiatric History (check all that apply)  Outpatient treatment  Inpatient/hospitilization   Psychiatric medications:  Lamictal 100 mg  Zyprexa 30 mg   Current Mental Health Hospitalizations/Previous Mental Health History:   Patient has been diagnosed with bipolar and delirium disorder. Patient receives medication management through SNF and they report no recent medication changes.   Current provider:   Insurance account manager and Date:   [place], [place]   Current Medications:   Scheduled Meds:      . antiseptic oral rinse  15 mL Mouth Rinse q12n4p  . aspirin EC  81 mg Oral q morning - 10a  . budesonide  0.25 mg Nebulization BID  . chlorproMAZINE  100 mg Oral QHS  . chlorproMAZINE  50 mg Oral BID  . docusate sodium  100 mg Oral BID  . folic acid  1 mg Oral Daily  . heparin  5,000 Units Subcutaneous 3 times per day  . lamoTRIgine  100 mg Oral BID  . levothyroxine  150 mcg Oral q morning - 10a  . multivitamin with minerals  1 tablet Oral Daily  . OLANZapine  30 mg Oral QHS  . pantoprazole  40 mg Oral Daily  . sodium chloride  3 mL Intravenous Q12H  . thiamine  100 mg Oral Daily  . Valproic Acid  1,000 mg Oral QHS        Continuous Infusions:      PRN Meds:.acetaminophen, acetaminophen, albuterol, alum & mag hydroxide-simeth, bisacodyl, HYDROcodone-acetaminophen, magnesium hydroxide, ondansetron (ZOFRAN)  IV, ondansetron, RESOURCE THICKENUP CLEAR       Previous Impatient Admission/Date/Reason:   Patient was at [facility] about 7 months ago.   Emotional Health / Current Symptoms    Suicide/Self Harm  None reported   Suicide attempt in the past:   Patient unable to fully participate in assessment. SNF reports patient has not endorsed any SI or HI.   Other harmful behavior:   None reported   Psychotic/Dissociative Symptoms  Unable to accurately assess   Other Psychotic/Dissociative Symptoms:    Attention/Behavioral Symptoms  Unable to accurately assess   Other Attention / Behavioral Symptoms:   Patient mumbling and unable to fully participate in assessment at this time.    Cognitive Impairment  Unable to accurately assess   Other Cognitive Impairment:   Patient mumbling and unable to fully participate in assessment at this time.    Mood and Adjustment  Unable to accurately assess    Stress, Anxiety, Trauma, Any Recent Loss/Stressor  None reported   Anxiety (frequency):   N/A   Phobia (specify):   N/A   Compulsive behavior (specify):   N/A   Obsessive behavior (specify):   N/A   Other:   N/A   Substance Abuse/Use  None   SBIRT completed (please refer for detailed history):  N  Self-reported substance use:   Patient has been LT resident at SNF who reports no substance use.   Urinary Drug  Screen Completed:  N Alcohol level:   N/A    Environmental/Housing/Living Arrangement  SKILLED NURSING FACILITY   Who is in the home:   [facility]   Emergency contact:  Clarance-friend/HCPOA   Financial  Medicare  Medicaid   Patient's Strengths and Goals (patient's own words):   Patient has stable SNF placement and receives medication management through SNF.   Clinical Social Worker's Interpretive Summary:   CSW received referral in order to complete psychosocial assessment. CSW reviewed chart and met with patient at bedside.  Patient laying in bed and mumbles throughout assessment. CSW unable to understand any words from patient. CSW will continue to follow in order to complete full assessment at later time.    CSW called and spoke with Roderic Ovens at [facility]. Patient is a LT resident and stayed at facility for about 1 year prior to being admitted to [facility] about 7 months ago. After release, patient was readmitted to SNF again. SNF reports that they brought patient to the hospital because she was not acting like her "normal self." SNF states that patient is usually active and engages with other staff and residents. Lately, patient has started experiencing tremors, becomes very forgetful, and is very disengaged. SNF was worried about patient's well being and wanted her to be evaluated at the hospital. SNF is agreeable to accept back when medically stable. SNF reports that patient does not have guardian and that patient's HCPOA has not been involved lately and has not visited her in a few months at SNF.    CSW called and spoke with friend/HCPOA (Clarance) who reports he is unsure about patient's behavior because he has not seen patient lately. HCPOA reports he visited patient at the hospital yesterday but was unable to provide much further information. HCPOA agreeable with patient returning to SNF at [place].    CSW will continue to follow and will assist as needed.   Disposition:  Recommend Psych CSW continuing to support while in hospital   [place], [place] [phone]

## 2013-11-07 NOTE — Progress Notes (Signed)
INITIAL NUTRITION ASSESSMENT  DOCUMENTATION CODES Per approved criteria  -Morbid Obesity   INTERVENTION: -Modify diet textures per SLP -Monitor PO intake and supplement as warranted/tolerated  NUTRITION DIAGNOSIS: Inadequate oral intake related to decreased alertness/difficulty swallowing as evidenced by PO intake <75%.   Goal: Pt to meet >/= 90% of their estimated nutrition needs    Monitor:  Total protein/energy intake, labs, weights, swallow profile  Reason for Assessment: Low Braden  63 y.o. female  Admitting Dx: Lithium toxicity  ASSESSMENT: [name] is a 63 y.o. female with history of schizoaffective disorder who presents with altered mental status  -Pt evaluated by SLP, recommended dys1/nectar thick diet -Per discussion with RN, pt with minimal intake. Has difficulty swallowing medication even when mixed with MagicCup/ice cream textures supplement. Pt slept through breakfast and was receiving feeding assistant by Nurse Tech for lunch; however was observed refusing food items -Pt resident of [facility]. Per discussion with SNF RN, pt with decreased PO intake for past 7 days d/t lethargy and decreased alertness. Was on regular diet textures; not receiving any supplements. Prior to one week, pt was at baseline, would have good days and bad days of PO intake -Discussed weight hx w/SNF RN. Reported pt weighed 305 lb in 10/21/2013, and 306 lbs in 09/30/2013. Weight loss nonsignificant -Will monitor PO intake as pt returns to baseline. Supplement with Ensure pudding/MagicCup as warranted  Height: Ht Readings from Last 1 Encounters:  11/05/13 5\' 5"  (1.651 m)    Weight: Wt Readings from Last 1 Encounters:  11/05/13 301 lb 9.4 oz (136.8 kg)    Ideal Body Weight: 125 lbs  % Ideal Body Weight: 241  Wt Readings from Last 10 Encounters:  11/05/13 301 lb 9.4 oz (136.8 kg)  04/26/13 307 lb 15.7 oz (139.7 kg)  04/19/13 294 lb (133.358 kg)  02/22/13 267 lb 8  oz (121.337 kg)  12/11/12 272 lb (123.378 kg)  11/06/12 298 lb (135.172 kg)  11/02/12 282 lb (127.914 kg)  10/16/12 298 lb (135.172 kg)  09/26/12 292 lb (132.45 kg)  09/20/12 291 lb 3.6 oz (132.1 kg)    Usual Body Weight: 300-305  % Usual Body Weight: 100%  BMI:  Body mass index is 50.19 kg/(m^2). Morbid Obesity  Estimated Nutritional Needs: Kcal: 1500-1700 Protein: 105-115 gram Fluid: >/=1500 ml/daily  Skin: MSAD on buttock  Diet Order: Dysphagia  EDUCATION NEEDS: -No education needs identified at this time   Intake/Output Summary (Last 24 hours) at 11/07/13 1432 Last data filed at 11/07/13 0743  Gross per 24 hour  Intake   2555 ml  Output      0 ml  Net   2555 ml    Last BM: 5/19   Labs:   Recent Labs Lab 11/05/13 1640 11/06/13 0411 11/07/13 0424  NA 135* 138 140  K 4.3 4.4 4.3  CL 95* 103 105  CO2 28 25 26   BUN 12 11 8   CREATININE 0.67 0.57 0.52  CALCIUM 11.4* 10.4 10.8*  GLUCOSE 63* 62* 72    CBG (last 3)   Recent Labs  11/06/13 0816 11/07/13 0714 11/07/13 0728  GLUCAP 70 73 77    Scheduled Meds: . antiseptic oral rinse  15 mL Mouth Rinse q12n4p  . aspirin EC  81 mg Oral q morning - 10a  . budesonide  0.25 mg Nebulization BID  . chlorproMAZINE  100 mg Oral QHS  . chlorproMAZINE  50 mg Oral BID  . docusate sodium  100 mg  Oral BID  . folic acid  1 mg Oral Daily  . heparin  5,000 Units Subcutaneous 3 times per day  . lamoTRIgine  100 mg Oral BID  . levothyroxine  150 mcg Oral q morning - 10a  . multivitamin with minerals  1 tablet Oral Daily  . OLANZapine  30 mg Oral QHS  . pantoprazole  40 mg Oral Daily  . sodium chloride  3 mL Intravenous Q12H  . thiamine  100 mg Oral Daily  . Valproic Acid  1,000 mg Oral QHS    Continuous Infusions:   Past Medical History  Diagnosis Date  . Schizoaffective disorder   . Urinary tract infection   . Hypertension   . GERD (gastroesophageal reflux disease)   . Hypothyroidism   . Obesity   .  Asthma   . COPD (chronic obstructive pulmonary disease)   . CHF (congestive heart failure)   . Seizures   . Morbid obesity   . Chronic pain   . Hypercholesteremia   . Anginal pain   . Myocardial infarction 2002  . Diabetes mellitus     "I'm not diabetic anymore" (05/23/2012)  . Anemia   . History of blood transfusion 1993    "when I had my hysterectomy" (05/23/2012)  . H/O hiatal hernia   . Daily headache   . Arthritis     "severe; all over my body" (05/23/2012)  . Hypoventilation syndrome     [name]/notes 05/30/2012  . Atrial fibrillation   . Atrial flutter, paroxysmal     [name]/notes 05/30/2012  . Exertional dyspnea   . Shortness of breath     "all the time lately" (05/30/2012)  . Pneumonia     "several times; including now" (04/23/2013)  . Anxiety   . Episodic mood disorder   . Psychosis   . Schizophrenia     Past Surgical History  Procedure Laterality Date  . Tubal ligation  1998  . Cholecystectomy  ?2008  . Cardiac catheterization  2008  . Vaginal hysterectomy  1993    [name] MS RD LDN Clinical Dietitian Pager:[phone]

## 2013-11-07 NOTE — Consult Note (Signed)
[facility] Face-to-Face Psychiatry Consult   Reason for Consult:  Lithium toxicity Referring Physician:  Dr Shea Evans is an 63 y.o. female. Total Time spent with patient: 15 minutes  Assessment: AXIS I:  Bipolar, mixed and Delirium disorder AXIS II:  Deferred AXIS III:   Past Medical History  Diagnosis Date  . Schizoaffective disorder   . Urinary tract infection   . Hypertension   . GERD (gastroesophageal reflux disease)   . Hypothyroidism   . Obesity   . Asthma   . COPD (chronic obstructive pulmonary disease)   . CHF (congestive heart failure)   . Seizures   . Morbid obesity   . Chronic pain   . Hypercholesteremia   . Anginal pain   . Myocardial infarction 2002  . Diabetes mellitus     "I'm not diabetic anymore" (05/23/2012)  . Anemia   . History of blood transfusion 1993    "when I had my hysterectomy" (05/23/2012)  . H/O hiatal hernia   . Daily headache   . Arthritis     "severe; all over my body" (05/23/2012)  . Hypoventilation syndrome     Archie Endo 05/30/2012  . Atrial fibrillation   . Atrial flutter, paroxysmal     Archie Endo 05/30/2012  . Exertional dyspnea   . Shortness of breath     "all the time lately" (05/30/2012)  . Pneumonia     "several times; including now" (04/23/2013)  . Anxiety   . Episodic mood disorder   . Psychosis   . Schizophrenia    AXIS IV:  other psychosocial or environmental problems and problems related to social environment AXIS V:  31-40 impairment in reality testing  Plan:  No evidence of imminent risk to self or others at present.   Patient does not meet criteria for psychiatric inpatient admission. Supportive therapy provided about ongoing stressors. Discussed crisis plan, support from social network, calling 911, coming to the Emergency Department, and calling Suicide Hotline. Medication management  Subjective:   [name] is a 63 y.o. female patient admitted with lithium toxicity and confusion.  HPI:  Patient seen  chart reviewed.  Patient was 64 year old Caucasian female who was admitted on the medical floor because of lithium toxicity.  Patient has multiple medical problems including hypertension, hypothyroidism, COPD and morbid obesity.  Patient has been seen in the past in consultation liaison services for confusion and change in her mental status.  Patient is unable to provide any information.  She was noticed mumbling and incoherent.  Her lithium level was very high upon admission that his now getting better.  Patient is on a moderate dose of Zyprexa and she is also taking Thorazine.  She is also taking Depakote and Lamictal.  As per chart she used to see Dr. Baird Cancer at [facility] but it is unclear if she has recently seen outpatient psychiatrist.  Patient lives in a skilled nursing facility.  In the past she has given Risperdal, Haldol, Seroquel and [name].  Patient is diagnosed with schizoaffective disorder and she supposed to see [facility] ACT team as per her previous record.  Currently she is taking to antipsychotic medication and multiple mood stabilizer.  Patient appears very lethargic, incoherent and delirious.  She is unable to provide any information.   Past Psychiatric History: Past Medical History  Diagnosis Date  . Schizoaffective disorder   . Urinary tract infection   . Hypertension   . GERD (gastroesophageal reflux disease)   . Hypothyroidism   .  Obesity   . Asthma   . COPD (chronic obstructive pulmonary disease)   . CHF (congestive heart failure)   . Seizures   . Morbid obesity   . Chronic pain   . Hypercholesteremia   . Anginal pain   . Myocardial infarction 2002  . Diabetes mellitus     "I'm not diabetic anymore" (05/23/2012)  . Anemia   . History of blood transfusion 1993    "when I had my hysterectomy" (05/23/2012)  . H/O hiatal hernia   . Daily headache   . Arthritis     "severe; all over my body" (05/23/2012)  . Hypoventilation syndrome     Archie Endo 05/30/2012  . Atrial fibrillation    . Atrial flutter, paroxysmal     Archie Endo 05/30/2012  . Exertional dyspnea   . Shortness of breath     "all the time lately" (05/30/2012)  . Pneumonia     "several times; including now" (04/23/2013)  . Anxiety   . Episodic mood disorder   . Psychosis   . Schizophrenia     reports that she quit smoking about 13 years ago. Her smoking use included Cigarettes. She has a 24 pack-year smoking history. Her smokeless tobacco use includes Chew. She reports that she does not drink alcohol or use illicit drugs. Family History  Problem Relation Age of Onset  . Heart disease Father   . Cancer Mother 95    Breast         Abuse/Neglect [facility]) Physical Abuse:  (unknown, pt unable to answer, no family present) Verbal Abuse:  (unknown, pt unable to answer, no family present) Sexual Abuse:  (unknown, pt unable to answer, no family present) Allergies:   Allergies  Allergen Reactions  . Food Rash    "lasagna"= rash from the noodles  . Penicillins Hives  . Sulfamethoxazole-Trimethoprim Hives and Nausea And Vomiting    ACT Assessment Complete:  Yes:    Educational Status    Risk to Self: Risk to self Is patient at risk for suicide?: No Substance abuse history and/or treatment for substance abuse?: No  Risk to Others:    Abuse: Abuse/Neglect Assessment (Assessment to be complete while patient is alone) Physical Abuse:  (unknown, pt unable to answer, no family present) Verbal Abuse:  (unknown, pt unable to answer, no family present) Sexual Abuse:  (unknown, pt unable to answer, no family present) Exploitation of patient/patient's resources:  (unknown, pt unable to answer, no family present) Self-Neglect:  (unknown, pt unable to answer, no family present)  Prior Inpatient Therapy:    Prior Outpatient Therapy:    Additional Information:                    Objective: Blood pressure 128/62, pulse 87, temperature 98.3 F (36.8 C), temperature source Axillary, resp. rate 20,  height 5' 5"  (1.651 m), weight 301 lb 9.4 oz (136.8 kg), SpO2 95.00%.Body mass index is 50.19 kg/(m^2). Results for orders placed during the hospital encounter of 11/05/13 (from the past 72 hour(s))  CBC     Status: Abnormal   Collection Time    11/05/13  4:40 PM      Result Value Ref Range   WBC 6.6  4.0 - 10.5 K/uL   RBC 3.79 (*) 3.87 - 5.11 MIL/uL   Hemoglobin 12.7  12.0 - 15.0 g/dL   HCT 41.9  36.0 - 46.0 %   MCV 110.6 (*) 78.0 - 100.0 fL   MCH 33.5  26.0 - 34.0  pg   MCHC 30.3  30.0 - 36.0 g/dL   RDW 13.4  11.5 - 15.5 %   Platelets 214  150 - 400 K/uL  COMPREHENSIVE METABOLIC PANEL     Status: Abnormal   Collection Time    11/05/13  4:40 PM      Result Value Ref Range   Sodium 135 (*) 137 - 147 mEq/L   Potassium 4.3  3.7 - 5.3 mEq/L   Chloride 95 (*) 96 - 112 mEq/L   CO2 28  19 - 32 mEq/L   Glucose, Bld 63 (*) 70 - 99 mg/dL   BUN 12  6 - 23 mg/dL   Creatinine, Ser 0.67  0.50 - 1.10 mg/dL   Calcium 11.4 (*) 8.4 - 10.5 mg/dL   Total Protein 7.0  6.0 - 8.3 g/dL   Albumin 3.4 (*) 3.5 - 5.2 g/dL   AST 12  0 - 37 U/L   ALT 10  0 - 35 U/L   Alkaline Phosphatase 118 (*) 39 - 117 U/L   Total Bilirubin 0.2 (*) 0.3 - 1.2 mg/dL   GFR calc non Af Amer >90  >90 mL/min   GFR calc Af Amer >90  >90 mL/min   Comment: (NOTE)     The eGFR has been calculated using the CKD EPI equation.     This calculation has not been validated in all clinical situations.     eGFR's persistently <90 mL/min signify possible Chronic Kidney     Disease.  VALPROIC ACID LEVEL     Status: Abnormal   Collection Time    11/05/13  4:40 PM      Result Value Ref Range   Valproic Acid Lvl 16.1 (*) 50.0 - 100.0 ug/mL   Comment: Performed at [facility]     Status: Abnormal   Collection Time    11/05/13  4:40 PM      Result Value Ref Range   Lithium Lvl 3.44 (*) 0.80 - 1.40 mEq/L   Comment: CRITICAL RESULT CALLED TO, READ BACK BY AND VERIFIED WITH:     Sharlyn Bologna RN 1758 11/05/13 A  NAVARRO  URINALYSIS, ROUTINE W REFLEX MICROSCOPIC     Status: Abnormal   Collection Time    11/05/13  5:01 PM      Result Value Ref Range   Color, Urine AMBER (*) YELLOW   Comment: BIOCHEMICALS MAY BE AFFECTED BY COLOR   APPearance CLOUDY (*) CLEAR   Specific Gravity, Urine 1.027  1.005 - 1.030   pH 6.5  5.0 - 8.0   Glucose, UA NEGATIVE  NEGATIVE mg/dL   Hgb urine dipstick NEGATIVE  NEGATIVE   Bilirubin Urine LARGE (*) NEGATIVE   Ketones, ur >80 (*) NEGATIVE mg/dL   Protein, ur 30 (*) NEGATIVE mg/dL   Urobilinogen, UA 1.0  0.0 - 1.0 mg/dL   Nitrite NEGATIVE  NEGATIVE   Leukocytes, UA TRACE (*) NEGATIVE  URINE MICROSCOPIC-ADD ON     Status: Abnormal   Collection Time    11/05/13  5:01 PM      Result Value Ref Range   Squamous Epithelial / LPF RARE  RARE   WBC, UA 3-6  <3 WBC/hpf   RBC / HPF 0-2  <3 RBC/hpf   Bacteria, UA FEW (*) RARE   Casts HYALINE CASTS (*) NEGATIVE   Urine-Other MUCOUS PRESENT    TSH     Status: None   Collection Time    11/05/13  5:06 PM      Result Value Ref Range   TSH 2.140  0.350 - 4.500 uIU/mL   Comment: Please note change in reference range.     Performed at [place], ED     Status: None   Collection Time    11/05/13  6:05 PM      Result Value Ref Range   Glucose-Capillary 79  70 - 99 mg/dL  MRSA PCR SCREENING     Status: None   Collection Time    11/05/13  7:38 PM      Result Value Ref Range   MRSA by PCR NEGATIVE  NEGATIVE   Comment:            The GeneXpert MRSA Assay (FDA     approved for NASAL specimens     only), is one component of a     comprehensive MRSA colonization     surveillance program. It is not     intended to diagnose MRSA     infection nor to guide or     monitor treatment for     MRSA infections.  COMPREHENSIVE METABOLIC PANEL     Status: Abnormal   Collection Time    11/06/13  4:11 AM      Result Value Ref Range   Sodium 138  137 - 147 mEq/L   Potassium 4.4  3.7 - 5.3 mEq/L    Chloride 103  96 - 112 mEq/L   Comment: DELTA CHECK NOTED     REPEATED TO VERIFY   CO2 25  19 - 32 mEq/L   Glucose, Bld 62 (*) 70 - 99 mg/dL   BUN 11  6 - 23 mg/dL   Creatinine, Ser 0.57  0.50 - 1.10 mg/dL   Calcium 10.4  8.4 - 10.5 mg/dL   Total Protein 6.0  6.0 - 8.3 g/dL   Albumin 2.8 (*) 3.5 - 5.2 g/dL   AST 12  0 - 37 U/L   ALT 9  0 - 35 U/L   Alkaline Phosphatase 103  39 - 117 U/L   Total Bilirubin <0.2 (*) 0.3 - 1.2 mg/dL   GFR calc non Af Amer >90  >90 mL/min   GFR calc Af Amer >90  >90 mL/min   Comment: (NOTE)     The eGFR has been calculated using the CKD EPI equation.     This calculation has not been validated in all clinical situations.     eGFR's persistently <90 mL/min signify possible Chronic Kidney     Disease.  CBC     Status: Abnormal   Collection Time    11/06/13  4:11 AM      Result Value Ref Range   WBC 6.4  4.0 - 10.5 K/uL   RBC 3.47 (*) 3.87 - 5.11 MIL/uL   Hemoglobin 11.8 (*) 12.0 - 15.0 g/dL   HCT 38.4  36.0 - 46.0 %   MCV 110.7 (*) 78.0 - 100.0 fL   MCH 34.0  26.0 - 34.0 pg   MCHC 30.7  30.0 - 36.0 g/dL   RDW 13.5  11.5 - 15.5 %   Platelets 184  150 - 400 K/uL  GLUCOSE, CAPILLARY     Status: Abnormal   Collection Time    11/06/13  7:33 AM      Result Value Ref Range   Glucose-Capillary 57 (*) 70 - 99 mg/dL  LITHIUM LEVEL     Status: Abnormal  Collection Time    11/06/13  7:57 AM      Result Value Ref Range   Lithium Lvl 3.13 (*) 0.80 - 1.40 mEq/L   Comment: CRITICAL RESULT CALLED TO, READ BACK BY AND VERIFIED WITH:     MEYERS,C. RN AT 0932 11/06/13 BARFIELD,T  GLUCOSE, CAPILLARY     Status: None   Collection Time    11/06/13  8:16 AM      Result Value Ref Range   Glucose-Capillary 70  70 - 99 mg/dL  CBC     Status: Abnormal   Collection Time    11/07/13  4:24 AM      Result Value Ref Range   WBC 7.3  4.0 - 10.5 K/uL   RBC 3.58 (*) 3.87 - 5.11 MIL/uL   Hemoglobin 12.0  12.0 - 15.0 g/dL   HCT 39.5  36.0 - 46.0 %   MCV 110.3 (*) 78.0  - 100.0 fL   MCH 33.5  26.0 - 34.0 pg   MCHC 30.4  30.0 - 36.0 g/dL   RDW 13.4  11.5 - 15.5 %   Platelets 183  150 - 400 K/uL  BASIC METABOLIC PANEL     Status: Abnormal   Collection Time    11/07/13  4:24 AM      Result Value Ref Range   Sodium 140  137 - 147 mEq/L   Potassium 4.3  3.7 - 5.3 mEq/L   Chloride 105  96 - 112 mEq/L   CO2 26  19 - 32 mEq/L   Glucose, Bld 72  70 - 99 mg/dL   BUN 8  6 - 23 mg/dL   Creatinine, Ser 0.52  0.50 - 1.10 mg/dL   Calcium 10.8 (*) 8.4 - 10.5 mg/dL   GFR calc non Af Amer >90  >90 mL/min   GFR calc Af Amer >90  >90 mL/min   Comment: (NOTE)     The eGFR has been calculated using the CKD EPI equation.     This calculation has not been validated in all clinical situations.     eGFR's persistently <90 mL/min signify possible Chronic Kidney     Disease.  LITHIUM LEVEL     Status: Abnormal   Collection Time    11/07/13  4:24 AM      Result Value Ref Range   Lithium Lvl 2.31 (*) 0.80 - 1.40 mEq/L   Comment: CRITICAL RESULT CALLED TO, READ BACK BY AND VERIFIED WITH:     SPOKE WITH TINER,A RN [phone] [place],N  GLUCOSE, CAPILLARY     Status: None   Collection Time    11/07/13  7:14 AM      Result Value Ref Range   Glucose-Capillary 73  70 - 99 mg/dL  GLUCOSE, CAPILLARY     Status: None   Collection Time    11/07/13  7:28 AM      Result Value Ref Range   Glucose-Capillary 77  70 - 99 mg/dL   Labs are reviewed and are pertinent for high lithium level.  Current Facility-Administered Medications  Medication Dose Route Frequency Provider Last Rate Last Dose  . acetaminophen (TYLENOL) tablet 650 mg  650 mg Oral Q6H PRN Allyne Gee, MD       Or  . acetaminophen (TYLENOL) suppository 650 mg  650 mg Rectal Q6H PRN Allyne Gee, MD      . albuterol (PROVENTIL) (2.5 MG/3ML) 0.083% nebulizer solution 2.5 mg  2.5 mg Nebulization Q6H PRN  Angela Adam, [facility]      . alum & mag hydroxide-simeth (MAALOX/MYLANTA) 200-200-20 MG/5ML suspension 30 mL   30 mL Oral Q6H PRN Allyne Gee, MD      . antiseptic oral rinse (BIOTENE) solution 15 mL  15 mL Mouth Rinse q12n4p Allyne Gee, MD   15 mL at 11/07/13 1200  . aspirin EC tablet 81 mg  81 mg Oral q morning - 10a Allyne Gee, MD   81 mg at 11/07/13 1102  . bisacodyl (DULCOLAX) suppository 10 mg  10 mg Rectal Daily PRN Allyne Gee, MD      . budesonide (PULMICORT) nebulizer solution 0.25 mg  0.25 mg Nebulization BID Allyne Gee, MD   0.25 mg at 11/07/13 0813  . chlorproMAZINE (THORAZINE) tablet 100 mg  100 mg Oral QHS Domenic Polite, MD      . chlorproMAZINE (THORAZINE) tablet 50 mg  50 mg Oral BID Domenic Polite, MD   50 mg at 11/07/13 1102  . docusate sodium (COLACE) capsule 100 mg  100 mg Oral BID Allyne Gee, MD   100 mg at 11/06/13 2214  . folic acid (FOLVITE) tablet 1 mg  1 mg Oral Daily Allyne Gee, MD   1 mg at 11/07/13 1102  . heparin injection 5,000 Units  5,000 Units Subcutaneous 3 times per day Allyne Gee, MD   5,000 Units at 11/07/13 [id number]  . HYDROcodone-acetaminophen (NORCO/VICODIN) 5-325 MG per tablet 1 tablet  1 tablet Oral Q6H PRN Domenic Polite, MD      . lamoTRIgine (LAMICTAL) tablet 100 mg  100 mg Oral BID Allyne Gee, MD   100 mg at 11/07/13 1102  . levothyroxine (SYNTHROID, LEVOTHROID) tablet 150 mcg  150 mcg Oral q morning - 10a Allyne Gee, MD   150 mcg at 11/07/13 1102  . magnesium hydroxide (MILK OF MAGNESIA) suspension 30 mL  30 mL Oral Daily PRN Allyne Gee, MD      . multivitamin with minerals tablet 1 tablet  1 tablet Oral Daily Allyne Gee, MD   1 tablet at 11/07/13 1102  . OLANZapine (ZYPREXA) tablet 30 mg  30 mg Oral QHS Allyne Gee, MD   30 mg at 11/06/13 2213  . ondansetron (ZOFRAN) tablet 4 mg  4 mg Oral Q6H PRN Allyne Gee, MD       Or  . ondansetron (ZOFRAN) injection 4 mg  4 mg Intravenous Q6H PRN Allyne Gee, MD      . pantoprazole (PROTONIX) EC tablet 40 mg  40 mg Oral Daily Allyne Gee, MD      . RESOURCE THICKENUP CLEAR    Oral PRN Domenic Polite, MD      . sodium chloride 0.9 % injection 3 mL  3 mL Intravenous Q12H Allyne Gee, MD      . thiamine (VITAMIN B-1) tablet 100 mg  100 mg Oral Daily Allyne Gee, MD   100 mg at 11/07/13 1102  . Valproic Acid (DEPAKENE) 250 MG/5ML syrup SYRP 1,000 mg  1,000 mg Oral QHS Domenic Polite, MD        Psychiatric Specialty Exam:     Blood pressure 128/62, pulse 87, temperature 98.3 F (36.8 C), temperature source Axillary, resp. rate 20, height 5' 5"  (1.651 m), weight 301 lb 9.4 oz (136.8 kg), SpO2 95.00%.Body mass index is 50.19 kg/(m^2).  General Appearance: Confused and sedated  Eye Contact::  Poor  Speech:  Slurred  Volume:  Decreased  Mood:  Irritable  Affect:  Inappropriate and Labile  Thought Process:  Loose  Orientation:  Negative  Thought Content:  Confusion  Suicidal Thoughts:   unable to assess   Homicidal Thoughts:  unable to assess   Memory:  Poor  Judgement:  Impaired  Insight:  Lacking  Psychomotor Activity:  Increased  Concentration:  Poor  Recall:  Poor  Fund of Knowledge:Poor  Language: Poor  Akathisia:  No  Handed:  Right  AIMS (if indicated):     Assets:  Housing  Sleep:      Musculoskeletal: Strength & Muscle Tone: Unable to assess Gait & Station: Unable to assess Patient leans: N/A  Treatment Plan Summary: Medication management, patient is very lethargic, confused and sedated.  Stop olanzapine as patient is already taking Thorazine.  Patient is also taking Lamictal and Depakote, discontinue lithium.  Treat underlying etiology for delirium.  We will reevaluate her if needed all of the symptoms get worse.  Please call [phone] if you have any further questions.  Arlyce Harman Shalayah Beagley 11/07/2013 12:10 PM

## 2013-11-07 NOTE — Progress Notes (Signed)
Attempted to give medications crushed and mixed with magic cup. Pt alert with eyes open and following commands with turning in the bed. Pt given medication mixture, but is not swallowing completely. Part of the medication mixture drooled back out of the left side of pt's mouth. Unsure of how much of the medications that pt actually received. Pt appeared comfortable at this time and did not cough. Pt's mouth suctioned and oral care performed. Will continue to monitor. Cyndy Freeze

## 2013-11-07 NOTE — Progress Notes (Signed)
Speech Language Pathology Treatment: Dysphagia  Patient Details Name: [name] MRN: [id number] DOB: 10/15/1950 Today's Date: 11/07/2013 Time: 1607-3710 SLP Time Calculation (min): 10 min  Assessment / Plan / Recommendation Clinical Impression  Note poor tolerance of po medications with RN today and pt required oral suctioning to clear.  SLP provided pt with pudding and nectar thick juice via cup.  Oral transit delays with decreased oral closure/lingual protrusion upon swallowing and anterior labial loss of liquid on left.  Verbal cues for pt to swallow were effective today - RN reports it was not effective when attempting medications today.    Congested breathing noted initially that cleared with cues to cough. Suspect intake and swallow function may vary based on pt's mental status and willingness to participate given her psychological history.   Recommend to continue puree/nectar (tsps of soda) with very strict precautions to maximize airway protection and mitigate aspiration risk.    HPI HPI: 63 yo female adm to [facility] with AMS due to lithium toxicity on 5/19.  Pt with PMH + for hiatal hernia, schizoaffective disorder, tardive dyskinesia, GERD.  Pt resides at [facility] and was consuming a regular/thin diet prior to admission per SNF SLP review of chart.   Head CT negative for acute change.  Swallow evaluation ordered due to pt having difficulty swallowing at breakfast today with anterior spillage and oral holding.     Pertinent Vitals Afebrile, decreased  SLP Plan  Continue with current plan of care    Recommendations Diet recommendations: Dysphagia 1 (puree);Nectar-thick liquid Liquids provided via: Cup Medication Administration: Crushed with puree Supervision: Full supervision/cueing for compensatory strategies;Staff to assist with self feeding Compensations: Slow rate;Small sips/bites;Check for pocketing;Check for anterior loss Postural Changes and/or Swallow Maneuvers: Seated  upright 90 degrees;Upright 30-60 min after meal              Oral Care Recommendations: Oral care BID Follow up Recommendations: Skilled Nursing facility Plan: Continue with current plan of care    GO     Mills Koller, MS [facility] SLP [phone]

## 2013-11-07 NOTE — Progress Notes (Signed)
Pt took all meds this evening. After patient received meds she began laughing uncontrollably and did not stop until she fell asleep. RN asked patient what was so funny, but patient gave garbled answer. Will continue to monitor.

## 2013-11-07 NOTE — Progress Notes (Addendum)
[facility] PROGRESS NOTE  [name] GOT:[id number] DOB: [birth year] DOA: 11/05/2013 PCP: Bufford Spikes, DO  Assessment/Plan: 1. Lithium toxicity  -Lithium on hold,  -level improving,   -no indication for HD  -stop IVF, supportive care  -repeat level in am  -monitor on tele-no events so far  2. Hypothyroidism  -continue with her thyroid supplementations  -FU TSH level   3. Hypertension  -stable, not on meds  4. Asthma  -continue albuterol PRN -stable  5. Atrial Fibrillation  -currently in sinus rhythymn  6. Schizoaffective disorder/suspect she has cognitive dysfunction too - i called her SNF RN who reports that at baseline with confusion, intermittent lethargy and some slurring of speech -i suspect Polypharmacy/Overmedication contributing to lethargy, will cut down Thorazine to 100mg  QHS from 200mg  and cut down BID dose from 100 to 50mg  -did not change depakote and lamictal dose due to h/o seizure disorder -on depakote/zyprexa - Psych eval requested for med mgt  7. Bed bound status -at baseline  Code Status: Full Code Family Communication: No family, called and d/w recent RN at [facility] 5/20 Disposition Plan: back to SNF   Consultants:  Psych pending  HPI/Subjective: No events overnight, moans most of the time  Objective: Filed Vitals:   11/07/13 0657  BP: 128/62  Pulse: 87  Temp: 98.3 F (36.8 C)  Resp: 20    Intake/Output Summary (Last 24 hours) at 11/07/13 0809 Last data filed at 11/07/13 0700  Gross per 24 hour  Intake   2715 ml  Output      0 ml  Net   2715 ml   Filed Weights   11/05/13 1951  Weight: 136.8 kg (301 lb 9.4 oz)    Exam:   General: AAOx to self only, moans,  confused, no distress  HEENT: pupils reactive, L pupil smaller  Cardiovascular: S1S2/RRR  Respiratory: CTAB  Abdomen: soft, Nt, BS present  Musculoskeletal: no edema c/c   Neuro: speech intermittently slurred, moves upper  extremities, but will not move lower ext to command  Data Reviewed: Basic Metabolic Panel:  Recent Labs Lab 11/05/13 1640 11/06/13 0411 11/07/13 0424  NA 135* 138 140  K 4.3 4.4 4.3  CL 95* 103 105  CO2 28 25 26   GLUCOSE 63* 62* 72  BUN 12 11 8   CREATININE 0.67 0.57 0.52  CALCIUM 11.4* 10.4 10.8*   Liver Function Tests:  Recent Labs Lab 11/05/13 1640 11/06/13 0411  AST 12 12  ALT 10 9  ALKPHOS 118* 103  BILITOT 0.2* <0.2*  PROT 7.0 6.0  ALBUMIN 3.4* 2.8*   No results found for this basename: LIPASE, AMYLASE,  in the last 168 hours No results found for this basename: AMMONIA,  in the last 168 hours CBC:  Recent Labs Lab 11/05/13 1640 11/06/13 0411 11/07/13 0424  WBC 6.6 6.4 7.3  HGB 12.7 11.8* 12.0  HCT 41.9 38.4 39.5  MCV 110.6* 110.7* 110.3*  PLT 214 184 183   Cardiac Enzymes: No results found for this basename: CKTOTAL, CKMB, CKMBINDEX, TROPONINI,  in the last 168 hours BNP (last 3 results) No results found for this basename: PROBNP,  in the last 8760 hours CBG:  Recent Labs Lab 11/05/13 1805 11/06/13 0733 11/06/13 0816 11/07/13 0714 11/07/13 0728  GLUCAP 79 57* 70 73 77    Recent Results (from the past 240 hour(s))  MRSA PCR SCREENING     Status: None   Collection Time    11/05/13  7:38 PM  Result Value Ref Range Status   MRSA by PCR NEGATIVE  NEGATIVE Final   Comment:            The GeneXpert MRSA Assay (FDA     approved for NASAL specimens     only), is one component of a     comprehensive MRSA colonization     surveillance program. It is not     intended to diagnose MRSA     infection nor to guide or     monitor treatment for     MRSA infections.     Studies: Ct Head Wo Contrast  11/05/2013   CLINICAL DATA:  Pain  EXAM: CT HEAD WITHOUT CONTRAST  TECHNIQUE: Contiguous axial images were obtained from the base of the skull through the vertex without intravenous contrast.  COMPARISON:  Prior CT from 07/28/2012  FINDINGS: Study  is degraded by motion artifact.  There is no acute intracranial hemorrhage or infarct. No mass lesion or midline shift. Gray-white matter differentiation is well maintained. Ventricles are normal in size without evidence of hydrocephalus. CSF containing spaces are within normal limits. No extra-axial fluid collection.  The calvarium is intact.  Orbital soft tissues are within normal limits.  The paranasal sinuses and mastoid air cells are well pneumatized and free of fluid.  Scalp soft tissues are unremarkable.  IMPRESSION: No acute intracranial abnormality.   Electronically Signed   By: Rise MuBenjamin  McClintock M.D.   On: 11/05/2013 18:01    Scheduled Meds: . antiseptic oral rinse  15 mL Mouth Rinse q12n4p  . aspirin EC  81 mg Oral q morning - 10a  . budesonide  0.25 mg Nebulization BID  . chlorproMAZINE  100 mg Oral QHS  . chlorproMAZINE  50 mg Oral BID  . docusate sodium  100 mg Oral BID  . folic acid  1 mg Oral Daily  . heparin  5,000 Units Subcutaneous 3 times per day  . lamoTRIgine  100 mg Oral BID  . levothyroxine  150 mcg Oral q morning - 10a  . multivitamin with minerals  1 tablet Oral Daily  . OLANZapine  30 mg Oral QHS  . pantoprazole  40 mg Oral Daily  . sodium chloride  3 mL Intravenous Q12H  . thiamine  100 mg Oral Daily  . Valproic Acid  1,000 mg Oral QHS   Continuous Infusions:   Antibiotics Given (last 72 hours)   None      Principal Problem:   Lithium toxicity Active Problems:   HYPOTHYROIDISM NOS   HYPERTENSION, BENIGN ESSENTIAL   ASTHMA   Atrial fibrillation    Time spent: 35min    [name]  [facility] Pager [phone]. If 7PM-7AM, please contact night-coverage at [email or web address], password [email or web address] 11/07/2013, 8:09 AM  LOS: 2 days

## 2013-11-07 NOTE — Progress Notes (Signed)
CRITICAL VALUE ALERT  Critical value received:  Lithium 2.31  Date of notification:  11/07/2013   Time of notification:  5:29 AM  Critical value read back:yes  Nurse who received alert:  Silvestre Gunner RN  MD notified (1st page):  Donnamarie Poag, NP  Time of first page:  5:30  MD notified (2nd page):  Time of second page:  Responding MD:  N/A, Lithium level trending down  Time MD responded:  N/A, Lithium level trending down

## 2013-11-08 DIAGNOSIS — F70 Mild intellectual disabilities: Secondary | ICD-10-CM

## 2013-11-08 DIAGNOSIS — J449 Chronic obstructive pulmonary disease, unspecified: Secondary | ICD-10-CM

## 2013-11-08 DIAGNOSIS — F259 Schizoaffective disorder, unspecified: Secondary | ICD-10-CM

## 2013-11-08 LAB — BASIC METABOLIC PANEL
BUN: 7 mg/dL (ref 6–23)
CHLORIDE: 102 meq/L (ref 96–112)
CO2: 27 mEq/L (ref 19–32)
Calcium: 11.4 mg/dL — ABNORMAL HIGH (ref 8.4–10.5)
Creatinine, Ser: 0.54 mg/dL (ref 0.50–1.10)
GFR calc non Af Amer: >90 mL/min (ref >90)
Glucose, Bld: 91 mg/dL (ref 70–99)
Potassium: 4.6 mEq/L (ref 3.7–5.3)
Sodium: 140 mEq/L (ref 137–147)

## 2013-11-08 LAB — GLUCOSE, CAPILLARY: Glucose-Capillary: 73 mg/dL (ref 70–99)

## 2013-11-08 LAB — LITHIUM LEVEL: Lithium Lvl: 1.89 mEq/L (ref 0.80–1.40)

## 2013-11-08 MED ORDER — CHLORPROMAZINE HCL 50 MG PO TABS: 50.0000 mg | ORAL_TABLET | Freq: Two times a day (BID) | ORAL | Status: DC

## 2013-11-08 MED ORDER — CHLORPROMAZINE HCL 200 MG PO TABS: 100.0000 mg | ORAL_TABLET | Freq: Every day | ORAL | Status: DC

## 2013-11-08 NOTE — Progress Notes (Signed)
Clinical Social Work  CSW went to meet with patient at bedside. Patient laying in bed with head to the side. Patient has soft music playing in room and is able to shake her head "yes" that the music relaxes her. Patient has breakfast tray and mumbled for CSW to assist with eating some ice cream. Patient reports a few times that she wants to go home but struggles with communicating any further. CSW inquired about Clarance and if he has come to visit but patient shakes her head "no". Patient remains difficult to understand and is unable to fully participate. CSW will continue to follow and will assist as needed.  [place], [place] [phone]

## 2013-11-08 NOTE — Discharge Summary (Signed)
Physician Discharge Summary  [name] ZOX:[id number] DOB: 08-Aug-1950 DOA: 11/05/2013  PCP: Bufford Spikes, DO  Admit date: 11/05/2013 Discharge date: 11/08/2013  Time spent: 50 minutes  Recommendations for Outpatient Follow-up:  1. Lithium level in 2-3days 2. Diet changed to Dysphagia 1 with nectar thick liquids, please have Speech therapy re-eval in 1 week and advance diet as tolerated 3. Please do not use more than 1 Anti-psychotic at a time if possible  Discharge Diagnoses:  Principal Problem:   Lithium toxicity Active Problems:   HYPOTHYROIDISM NOS   HYPERTENSION, BENIGN ESSENTIAL   ASTHMA   Atrial fibrillation   Bipolar disorder   COgnitive dysfunction   Seizure disorder   Suspected Dementia   Chronic Diastolic CHF   COPD   Morbid Obesity  Discharge Condition: stable  Diet recommendation: Dysphagia 1 diet with nectar thick liquids  Filed Weights   11/05/13 1951  Weight: 136.8 kg (301 lb 9.4 oz)    History of present illness:  [name] is a 63 y.o. female with history of schizoaffective disorder who presents with altered mental status. She has not been speaking and has basically been mumbling even now during my evaluation. She is not able to provide any other history. According to the notes this has been going on since yesterday. There has been no observed seizure activity. She has not had any trauma reported. She has no LOC noted. She has not had any nausea and no vomiting noted  Hospital Course:  1. Lithium toxicity -Lithium held and stopped  -treated with supportive care -Did not require hemodialysis -levels have improved from 3.4 on admission to 1.8 at discharge -no arrhythmias noted on tele  2. Hypothyroidism  -continue with her thyroid supplementations  - TSH level normal  3. Hypertension  -stable, not on meds   4. Asthma  -continue albuterol PRN  -stable   5. Atrial Fibrillation  -currently in sinus rhythym  6. Schizoaffective  disorder/suspect she has cognitive dysfunction/dementia too  - i called her SNF RN who reports that at baseline with confusion, intermittent lethargy and some slurring of speech, she reportedly sleeps most of the day at her SNF -We suspected Polypharmacy/Overmedication contributing to lethargy, hence cut down Thorazine to 100mg  QHS from 200mg  and cut down BID dose from 100 to 50mg   -did not change depakote and lamictal dose due to h/o seizure disorder  -Was also seen by Psychiatry for medication management who recommended stopping Zyprexa he felt that she didn't need 2 anti-psychotics and recommended titrating her Thorazine as needed -caution with overmedication in terms of her Psychotropic meds  7. Bed bound status  -at baseline  8. Dysphagia -was seen by ST and started on a dysphagia 1 diet with nectar thick lqiuids, need re-eval in 1 week  Consultations:  Psychiatry Dr.Arfeen  Discharge Exam: Filed Vitals:   11/08/13 0629  BP: 117/82  Pulse: 78  Temp: 98.3 F (36.8 C)  Resp: 20    General: Alert, awake, smiles, speech with some slurring, intermittently confused Cardiovascular: S1S2/RRR Respiratory: CTAB  Discharge Instructions You were cared for by a hospitalist during your hospital stay. If you have any questions about your discharge medications or the care you received while you were in the hospital after you are discharged, you can call the unit and asked to speak with the hospitalist on call if the hospitalist that took care of you is not available. Once you are discharged, your primary care physician will handle any further medical issues.  Please note that NO REFILLS for any discharge medications will be authorized once you are discharged, as it is imperative that you return to your primary care physician (or establish a relationship with a primary care physician if you do not have one) for your aftercare needs so that they can reassess your need for medications and monitor  your lab values.  Discharge Instructions   Discharge instructions    Complete by:  As directed   Dysphagia 1 diet with nectar thick liquids, repeat ST eval in 1 week and advance diet as tolerated     Increase activity slowly    Complete by:  As directed             Medication List    STOP taking these medications       ciprofloxacin 500 MG tablet  Commonly known as:  CIPRO     lithium 600 MG capsule     OLANZapine 15 MG tablet  Commonly known as:  ZYPREXA      TAKE these medications       albuterol 108 (90 BASE) MCG/ACT inhaler  Commonly known as:  PROVENTIL HFA;VENTOLIN HFA  Inhale 2 puffs into the lungs every 6 (six) hours as needed for wheezing.     aspirin EC 81 MG tablet  Take 81 mg by mouth every morning.     bisacodyl 10 MG suppository  Commonly known as:  DULCOLAX  Place 10 mg rectally every 3 (three) days as needed for mild constipation or moderate constipation.     budesonide 0.25 MG/2ML nebulizer solution  Commonly known as:  PULMICORT  Take 0.25 mg by nebulization 2 (two) times daily.     chlorproMAZINE 50 MG tablet  Commonly known as:  THORAZINE  Take 1 tablet (50 mg total) by mouth 2 (two) times daily.     chlorproMAZINE 200 MG tablet  Commonly known as:  THORAZINE  Take 0.5 tablets (100 mg total) by mouth at bedtime.     divalproex 500 MG 24 hr tablet  Commonly known as:  DEPAKOTE ER  Take 1,000 mg by mouth at bedtime.     lamoTRIgine 100 MG tablet  Commonly known as:  LAMICTAL  Take 100 mg by mouth 2 (two) times daily.     levothyroxine 150 MCG tablet  Commonly known as:  SYNTHROID, LEVOTHROID  Take 150 mcg by mouth every morning. For thyroid disease.     MILK OF MAGNESIA 400 MG/5ML suspension  Generic drug:  magnesium hydroxide  Take 30 mLs by mouth daily as needed for mild constipation.     multivitamin tablet  Take 1 tablet by mouth daily.     omeprazole 20 MG capsule  Commonly known as:  PRILOSEC  Take 40 mg by mouth every  morning.       Allergies  Allergen Reactions  . Food Rash    "lasagna"= rash from the noodles  . Penicillins Hives  . Sulfamethoxazole-Trimethoprim Hives and Nausea And Vomiting       Follow-up Information   Follow up with REED, TIFFANY, DO. Schedule an appointment as soon as possible for a visit in 1 week.   Specialty:  Geriatric Medicine   Contact information:   [address] [place] [address] [phone]        The results of significant diagnostics from this hospitalization (including imaging, microbiology, ancillary and laboratory) are listed below for reference.    Significant Diagnostic Studies: Ct Head Wo Contrast  11/05/2013  CLINICAL DATA:  Pain  EXAM: CT HEAD WITHOUT CONTRAST  TECHNIQUE: Contiguous axial images were obtained from the base of the skull through the vertex without intravenous contrast.  COMPARISON:  Prior CT from 07/28/2012  FINDINGS: Study is degraded by motion artifact.  There is no acute intracranial hemorrhage or infarct. No mass lesion or midline shift. Gray-white matter differentiation is well maintained. Ventricles are normal in size without evidence of hydrocephalus. CSF containing spaces are within normal limits. No extra-axial fluid collection.  The calvarium is intact.  Orbital soft tissues are within normal limits.  The paranasal sinuses and mastoid air cells are well pneumatized and free of fluid.  Scalp soft tissues are unremarkable.  IMPRESSION: No acute intracranial abnormality.   Electronically Signed   By: Rise MuBenjamin  McClintock M.D.   On: 11/05/2013 18:01    Microbiology: Recent Results (from the past 240 hour(s))  MRSA PCR SCREENING     Status: None   Collection Time    11/05/13  7:38 PM      Result Value Ref Range Status   MRSA by PCR NEGATIVE  NEGATIVE Final   Comment:            The GeneXpert MRSA Assay (FDA     approved for NASAL specimens     only), is one component of a     comprehensive MRSA colonization      surveillance program. It is not     intended to diagnose MRSA     infection nor to guide or     monitor treatment for     MRSA infections.     Labs: Basic Metabolic Panel:  Recent Labs Lab 11/05/13 1640 11/06/13 0411 11/07/13 0424 11/08/13 0800  NA 135* 138 140 140  K 4.3 4.4 4.3 4.6  CL 95* 103 105 102  CO2 28 25 26 27   GLUCOSE 63* 62* 72 91  BUN 12 11 8 7   CREATININE 0.67 0.57 0.52 0.54  CALCIUM 11.4* 10.4 10.8* 11.4*   Liver Function Tests:  Recent Labs Lab 11/05/13 1640 11/06/13 0411  AST 12 12  ALT 10 9  ALKPHOS 118* 103  BILITOT 0.2* <0.2*  PROT 7.0 6.0  ALBUMIN 3.4* 2.8*   No results found for this basename: LIPASE, AMYLASE,  in the last 168 hours No results found for this basename: AMMONIA,  in the last 168 hours CBC:  Recent Labs Lab 11/05/13 1640 11/06/13 0411 11/07/13 0424  WBC 6.6 6.4 7.3  HGB 12.7 11.8* 12.0  HCT 41.9 38.4 39.5  MCV 110.6* 110.7* 110.3*  PLT 214 184 183   Cardiac Enzymes: No results found for this basename: CKTOTAL, CKMB, CKMBINDEX, TROPONINI,  in the last 168 hours BNP: BNP (last 3 results) No results found for this basename: PROBNP,  in the last 8760 hours CBG:  Recent Labs Lab 11/06/13 0733 11/06/13 0816 11/07/13 0714 11/07/13 0728 11/08/13 0754  GLUCAP 57* 70 73 77 73       Signed:  Zannie CovePreetha Jamahl Lemmons  [facility] 11/08/2013, 2:27 PM

## 2013-11-08 NOTE — Progress Notes (Signed)
Pt moaning a lot and not sleeping. Pt asked if she needed pain medicine. Pt shook head no. Pt given a few sips of thickened water. Pt reminded to swallow. Pt encouraged to sleep and repositioned head. Pt refused to be turned. Will continue to monitor.

## 2013-11-08 NOTE — Progress Notes (Signed)
Patient is set to discharge back to [facility] today. Patient & friend, [name] made aware. Discharge packet in [facility] - RN, [place] aware. [facility] called for transport pickup @ 4:30pm.   Lincoln Maxin, LCSW [facility] Clinical Social Worker cell #: [phone]

## 2013-11-08 NOTE — Progress Notes (Signed)
Pt removed oxygen prior to EMS arrival. Pt oxygen saturations 93% on room air.

## 2013-11-08 NOTE — Progress Notes (Signed)
CRITICAL VALUE ALERT  Critical value received:  Lithium 1.89  Date of notification:  11/08/13  Time of notification:  0913  Critical value read back:yes  Nurse who received alert:  Kathee Delton RN  MD notified (1st page):  Jomarie Longs  Time of first page:  0914  MD notified (2nd page):  Time of second page:  Responding MD:  Dr. Jomarie Longs  Time MD responded:  [phone]

## 2013-11-11 ENCOUNTER — Encounter: Payer: Self-pay | Admitting: Internal Medicine

## 2013-11-12 ENCOUNTER — Non-Acute Institutional Stay (SKILLED_NURSING_FACILITY): Payer: Medicare Other | Admitting: Internal Medicine

## 2013-11-12 ENCOUNTER — Encounter: Payer: Self-pay | Admitting: Internal Medicine

## 2013-11-12 DIAGNOSIS — J45909 Unspecified asthma, uncomplicated: Secondary | ICD-10-CM

## 2013-11-12 DIAGNOSIS — R1310 Dysphagia, unspecified: Secondary | ICD-10-CM

## 2013-11-12 DIAGNOSIS — T65891A Toxic effect of other specified substances, accidental (unintentional), initial encounter: Secondary | ICD-10-CM

## 2013-11-12 DIAGNOSIS — I10 Essential (primary) hypertension: Secondary | ICD-10-CM

## 2013-11-12 DIAGNOSIS — F259 Schizoaffective disorder, unspecified: Secondary | ICD-10-CM

## 2013-11-12 DIAGNOSIS — E039 Hypothyroidism, unspecified: Secondary | ICD-10-CM

## 2013-11-12 DIAGNOSIS — T56891A Toxic effect of other metals, accidental (unintentional), initial encounter: Secondary | ICD-10-CM

## 2013-11-12 DIAGNOSIS — T56894A Toxic effect of other metals, undetermined, initial encounter: Secondary | ICD-10-CM

## 2013-11-12 DIAGNOSIS — F70 Mild intellectual disabilities: Secondary | ICD-10-CM

## 2013-11-12 NOTE — Assessment & Plan Note (Signed)
was seen by ST and started on a dysphagia 1 diet with nectar thick lqiuids, need re-eval in 1 week; pt has already asked me for her prior diet with regular and thin liquids; have consulted ST

## 2013-11-12 NOTE — Assessment & Plan Note (Signed)
ithium held and stopped  -treated with supportive care  -Did not require hemodialysis  -levels have improved from 3.4 on admission to 1.8 at discharge  -no arrhythmias noted on tele

## 2013-11-12 NOTE — Assessment & Plan Note (Signed)
Stable on no meds 

## 2013-11-12 NOTE — Assessment & Plan Note (Signed)
Certainly contibutes to behavoir and MS

## 2013-11-12 NOTE — Progress Notes (Signed)
MRN: [id number] Name: [name]  Sex: female Age: 63 y.o. DOB: 1951-06-02  PSC #: [name] Facility/Room: 111A Level Of Care: SNF Provider: Margit [name] Emergency Contacts: Extended Emergency Contact Information Primary Emergency Contact: [name] Address: [address]          [place], [place] [address] [place] Phone: [phone] Relation: Friend Secondary Emergency Contact: [name] [place] Mobile Phone: [phone] Relation: Friend  Code Status: FULl  Allergies: Food; Penicillins; and Sulfamethoxazole-trimethoprim  Chief Complaint  Patient presents with  . nursing home admission    HPI: Patient is 63 y.o. female who was just in hospital for MS change 2/2 lithium toxicity.  Past Medical History  Diagnosis Date  . Schizoaffective disorder   . Urinary tract infection   . Hypertension   . GERD (gastroesophageal reflux disease)   . Hypothyroidism   . Obesity   . Asthma   . COPD (chronic obstructive pulmonary disease)   . CHF (congestive heart failure)   . Seizures   . Morbid obesity   . Chronic pain   . Hypercholesteremia   . Anginal pain   . Myocardial infarction 2002  . Diabetes mellitus     "I'm not diabetic anymore" (05/23/2012)  . Anemia   . History of blood transfusion 1993    "when I had my hysterectomy" (05/23/2012)  . H/O hiatal hernia   . Daily headache   . Arthritis     "severe; all over my body" (05/23/2012)  . Hypoventilation syndrome     [name]  . Atrial fibrillation   . Atrial flutter, paroxysmal     [name]  . Exertional dyspnea   . Shortness of breath     "all the time lately" ([name])  . Pneumonia     "several times; including now" (04/23/2013)  . Anxiety   . Episodic mood disorder   . Psychosis   . Schizophrenia     Past Surgical History  Procedure Laterality Date  . Tubal ligation  1998  . Cholecystectomy   ?2008  . Cardiac catheterization  2008  . Vaginal hysterectomy  1993      Medication List       This list is accurate as of: 11/12/13  8:46 PM.  Always use your most recent med list.               albuterol 108 (90 BASE) MCG/ACT inhaler  Commonly known as:  PROVENTIL HFA;VENTOLIN HFA  Inhale 2 puffs into the lungs every 6 (six) hours as needed for wheezing.     aspirin EC 81 MG tablet  Take 81 mg by mouth every morning.     bisacodyl 10 MG suppository  Commonly known as:  DULCOLAX  Place 10 mg rectally every 3 (three) days as needed for mild constipation or moderate constipation.     budesonide 0.25 MG/2ML nebulizer solution  Commonly known as:  PULMICORT  Take 0.25 mg by nebulization 2 (two) times daily.     chlorproMAZINE 50 MG tablet  Commonly known as:  THORAZINE  Take 1 tablet (50 mg total) by mouth 2 (two) times daily.     chlorproMAZINE 200 MG tablet  Commonly known as:  THORAZINE  Take 0.5 tablets (100 mg total) by mouth at bedtime.     divalproex 500 MG 24 hr tablet  Commonly known as:  DEPAKOTE ER  Take  1,000 mg by mouth at bedtime.     lamoTRIgine 100 MG tablet  Commonly known as:  LAMICTAL  Take 100 mg by mouth 2 (two) times daily.     levothyroxine 150 MCG tablet  Commonly known as:  SYNTHROID, LEVOTHROID  Take 150 mcg by mouth every morning. For thyroid disease.     MILK OF MAGNESIA 400 MG/5ML suspension  Generic drug:  magnesium hydroxide  Take 30 mLs by mouth daily as needed for mild constipation.     multivitamin tablet  Take 1 tablet by mouth daily.     omeprazole 20 MG capsule  Commonly known as:  PRILOSEC  Take 40 mg by mouth every morning.        No orders of the defined types were placed in this encounter.    Immunization History  Administered Date(s) Administered  . Influenza Split 03/29/2012  . Influenza Whole 05/27/2009, 02/18/2010  . Influenza,inj,Quad PF,36+ Mos 04/24/2013  . Pneumococcal Polysaccharide-23 05/20/2009,  03/29/2012  . Td 05/27/2009    History  Substance Use Topics  . Smoking status: Former Smoker -- 2.00 packs/day for 12 years    Types: Cigarettes    Quit date: 06/29/2000  . Smokeless tobacco: Current User    Types: Chew  . Alcohol Use: No    Family history is noncontributory    Review of Systems  DATA OBTAINED: from patient; only c/o is wants prior diet back! GENERAL: Feels well no fevers, fatigue SKIN: No itching, rash or wounds EYES: No eye pain, redness, discharge EARS: No earache, tinnitus, change in hearing NOSE: No congestion, drainage or bleeding  MOUTH/THROAT: No mouth or tooth pain, No sore throat RESPIRATORY: No cough, wheezing, SOB CARDIAC: No chest pain, palpitations, lower extremity edema  GI: No abdominal pain, No N/V/D or constipation, No heartburn or reflux  GU: No dysuria, frequency or urgency, or incontinence  MUSCULOSKELETAL: No unrelieved bone/joint pain NEUROLOGIC: No headache, dizziness or focal weakness PSYCHIATRIC: No overt anxiety or sadness. Sleeps well. No behavior issue.   Filed Vitals:   11/12/13 2034  BP: 138/89  Pulse: 84  Temp: 98.1 F (36.7 C)  Resp: 20    Physical Exam  GENERAL APPEARANCE: Alert, conversant; No acute distress, obese WF  SKIN: No diaphoresis rash HEAD: Normocephalic, atraumatic  EYES: Conjunctiva/lids clear. Pupils round, reactive. EOMs intact.  EARS: External exam WNL, canals clear. Hearing grossly normal.  NOSE: No deformity or discharge.  MOUTH/THROAT: Lips w/o lesions  RESPIRATORY: Breathing is even, unlabored. Lung sounds are clear   CARDIOVASCULAR: Heart RRR no murmurs, rubs or gallops. No peripheral edema.  GASTROINTESTINAL: Abdomen is soft, non-tender, not distended w/ normal bowel sounds GENITOURINARY: Bladder non tender, not distended  MUSCULOSKELETAL: No abnormal joints or musculature NEUROLOGIC: . Cranial nerves 2-12 grossly intact. Moves all extremities no tremor. PSYCHIATRIC: back to her usual  self, no behavioral issues  Patient Active Problem List   Diagnosis Date Noted  . Dysphagia, unspecified(787.20) 11/12/2013  . Lithium toxicity 11/05/2013  . Rash of back 10/15/2013  . Schizophrenia 07/05/2013  . Psychosis 07/05/2013  . Urinary tract infection, site not specified 04/29/2013  . Schizo-affective psychosis 04/19/2013  . COPD (chronic obstructive pulmonary disease) 04/19/2013  . Dyspnea and respiratory abnormality 04/19/2013  . Constipation 12/11/2012  . Hypercapnic respiratory failure, chronic 09/02/2012  . Altered mental status 07/29/2012  . Tachycardia 03/30/2012  . Hypertension 03/28/2012  . Atrial fibrillation 03/28/2012  . Hypothyroidism 03/28/2012  . Diabetes mellitus 03/28/2012  . Hypotension 03/28/2012  .  Acute respiratory failure with hypoxia 03/28/2012  . Morbid obesity   . Chronic pain   . Nausea 01/26/2012  . Diarrhea 01/26/2012  . Chest pain 01/11/2012  . Hyponatremia 01/11/2012  . Healthcare-associated pneumonia 01/11/2012  . Community acquired pneumonia 07/29/2011  . Suicidal ideations 07/29/2011  . Schizoaffective disorder, bipolar type 06/20/2011  . ANEMIA 07/13/2010  . CHEST PAIN 07/13/2010  . OBSTRUCTIVE SLEEP APNEA 01/22/2010  . CHOLELITHIASIS 11/30/2009  . MYOCARDIAL INFARCTION 11/17/2009  . ASTHMA 11/17/2009  . INSOMNIA 11/17/2009  . DEGENERATIVE JOINT DISEASE, KNEES, BILATERAL 05/27/2009  . URINALYSIS, ABNORMAL 05/27/2009  . OBESITY 03/26/2009  . DYSPNEA ON EXERTION 08/12/2008  . LOW BACK PAIN SYNDROME 12/11/2007  . HYPOTHYROIDISM NOS 02/06/2007  . DEFICIENCY, B-COMPLEX NEC 02/06/2007  . RETARDATION, MENTAL, MILD 02/06/2007  . HYPERTENSION, BENIGN ESSENTIAL 02/06/2007  . GERD 02/06/2007  . CHEST PAIN, HX OF 02/21/2006  . TARDIVE DYSKINESIA 08/10/2004    CBC    Component Value Date/Time   WBC 7.3 11/07/2013 0424   RBC 3.58* 11/07/2013 0424   RBC 2.70* 05/19/2011 0632   HGB 12.0 11/07/2013 0424   HCT 39.5 11/07/2013 0424    PLT 183 11/07/2013 0424   MCV 110.3* 11/07/2013 0424   LYMPHSABS 2.2 04/24/2013 0805   MONOABS 0.6 04/24/2013 0805   EOSABS 0.1 04/24/2013 0805   BASOSABS 0.0 04/24/2013 0805    CMP     Component Value Date/Time   NA 140 11/08/2013 0800   K 4.6 11/08/2013 0800   CL 102 11/08/2013 0800   CO2 27 11/08/2013 0800   GLUCOSE 91 11/08/2013 0800   BUN 7 11/08/2013 0800   CREATININE 0.54 11/08/2013 0800   CALCIUM 11.4* 11/08/2013 0800   PROT 6.0 11/06/2013 0411   ALBUMIN 2.8* 11/06/2013 0411   AST 12 11/06/2013 0411   ALT 9 11/06/2013 0411   ALKPHOS 103 11/06/2013 0411   BILITOT <0.2* 11/06/2013 0411   GFRNONAA >90 11/08/2013 0800   GFRAA >90 11/08/2013 0800    Assessment and Plan  Lithium toxicity ithium held and stopped  -treated with supportive care  -Did not require hemodialysis  -levels have improved from 3.4 on admission to 1.8 at discharge  -no arrhythmias noted on tele   HYPOTHYROIDISM NOS TSH was normal;continue with current supplement  HYPERTENSION, BENIGN ESSENTIAL Stable on no meds  Schizo-affective psychosis suspected Polypharmacy/Overmedication contributing to lethargy, hence cut down Thorazine to 100mg  QHS from 200mg  and cut down BID dose from 100 to 50mg   -did not change depakote and lamictal dose due to h/o seizure disorder  -Was also seen by Psychiatry for medication management who recommended stopping Zyprexa he felt that she didn't need 2 anti-psychotics and recommended titrating her Thorazine as needed  -caution with overmedication in terms of her Psychotropic meds Pt has been placed on Psych meds by Psych-will pass on advice  RETARDATION, MENTAL, MILD Certainly contibutes to behavoir and MS  Dysphagia, unspecified(787.20) was seen by ST and started on a dysphagia 1 diet with nectar thick lqiuids, need re-eval in 1 week; pt has already asked me for her prior diet with regular and thin liquids; have consulted ST   ASTHMA Stable on scheduled and prn meds    Jennifer HanksAnne D  Alexander, MD

## 2013-11-12 NOTE — Assessment & Plan Note (Signed)
TSH was normal;continue with current supplement

## 2013-11-12 NOTE — Assessment & Plan Note (Signed)
Stable on scheduled and prn meds

## 2013-11-12 NOTE — Assessment & Plan Note (Signed)
suspected Polypharmacy/Overmedication contributing to lethargy, hence cut down Thorazine to 100mg  QHS from 200mg  and cut down BID dose from 100 to 50mg   -did not change depakote and lamictal dose due to h/o seizure disorder  -Was also seen by Psychiatry for medication management who recommended stopping Zyprexa he felt that she didn't need 2 anti-psychotics and recommended titrating her Thorazine as needed  -caution with overmedication in terms of her Psychotropic meds Pt has been placed on Psych meds by Psych-will pass on advice

## 2013-11-24 IMAGING — CR DG CHEST 1V PORT
1 series · 1 of 1 positions shown · non-contrast
Comparison: Two-view chest x-ray 06/29/2012.  Portable chest x-ray
05/30/2012.

CLINICAL DATA: Fall.

PORTABLE CHEST - 1 VIEW [DATE]/9024 2208 hours:

[AP]
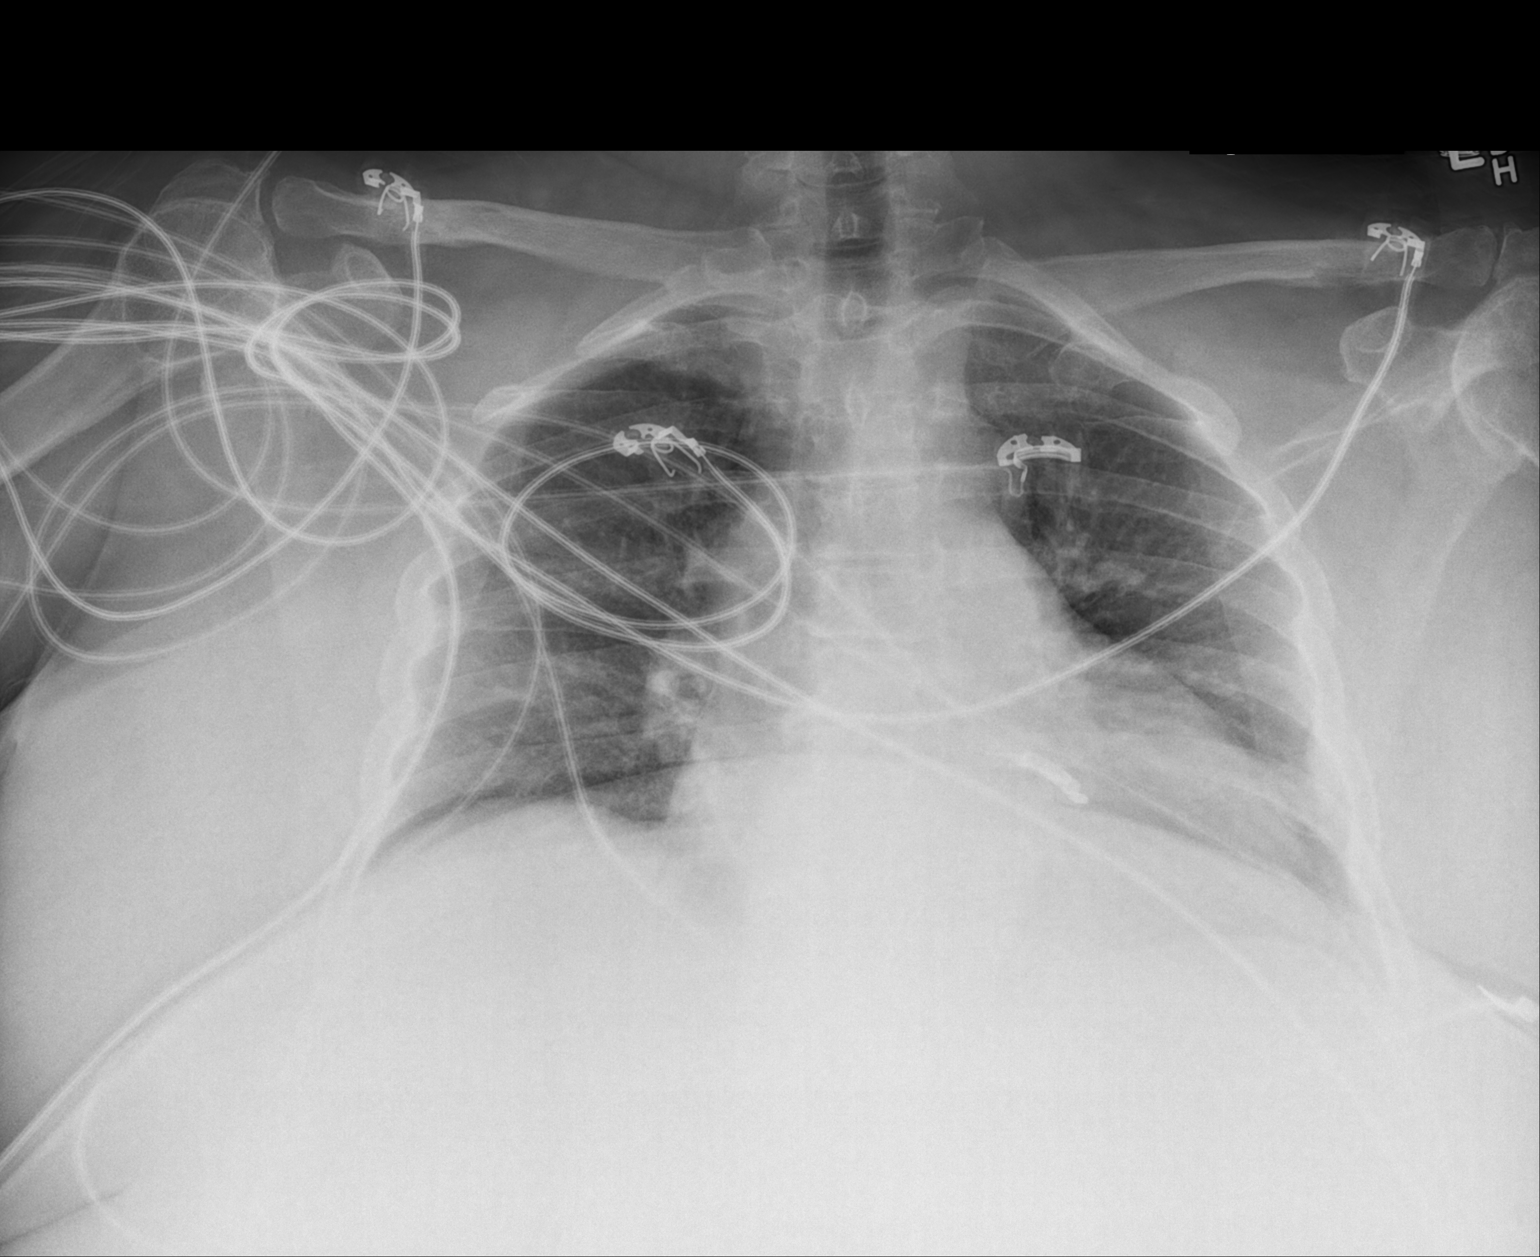

[1 of 1 positions shown; findings below may reference images not displayed]

FINDINGS: Suboptimal inspiration due to body habitus which accounts
for crowded bronchovascular markings at the bases and accentuates
the cardiac silhouette.  Taking this into account, cardiac
silhouette enlarged but stable.  Lungs clear.  No visible pleural
effusions.
IMPRESSION: Markedly suboptimal inspiration.  No acute cardiopulmonary disease.
Stable cardiomegaly.

## 2013-11-24 IMAGING — CR DG PORTABLE PELVIS
1 series · 1 of 1 positions shown · non-contrast
Comparison: Visualized pelvis on the acute abdomen series
11/30/2010.

CLINICAL DATA: Fall.

PORTABLE PELVIS AP VIEW [DATE]/9875 7085 hours:

[AP]
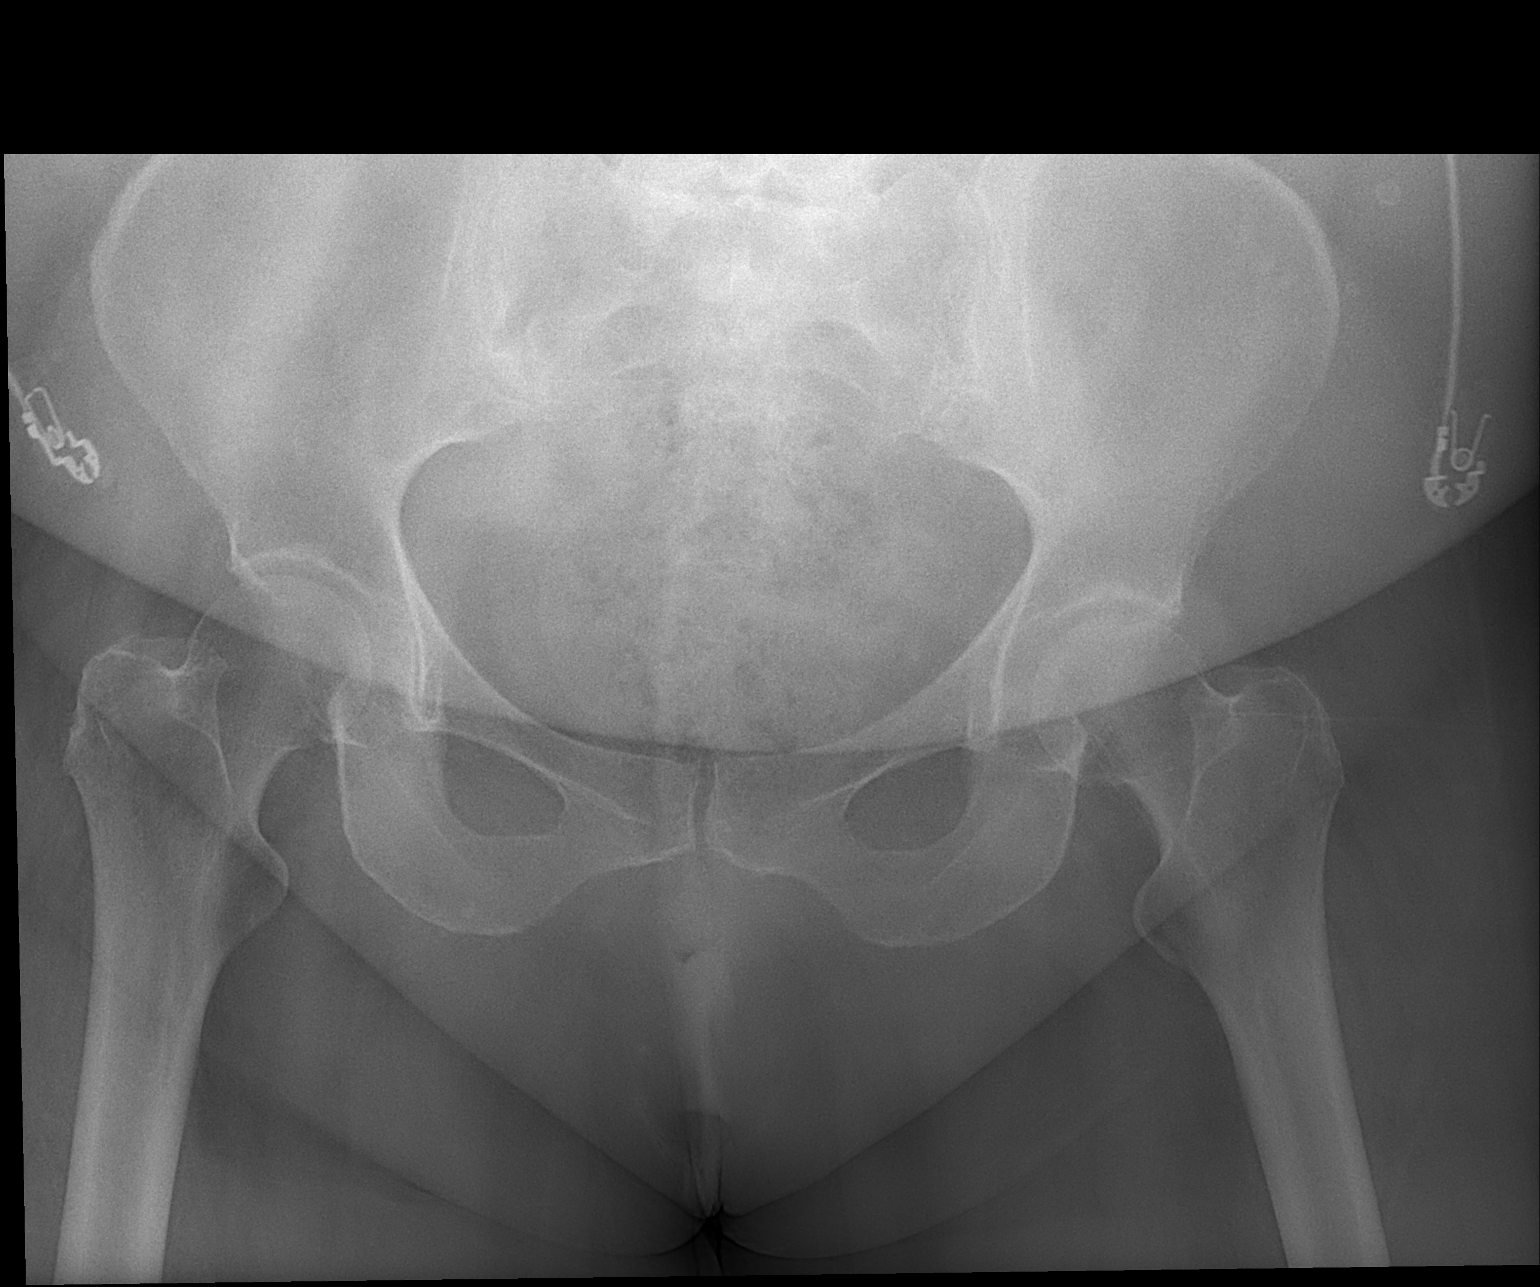

[1 of 1 positions shown; findings below may reference images not displayed]

FINDINGS: No acute fractures identified involving the pelvis.
Sacroiliac joints intact with degenerative changes.  Symphysis
pubis intact.  Mild joint space narrowing involving the left hip,
unchanged from prior examination.  Joint space in the right hip
well-preserved.
IMPRESSION: No acute osseous abnormality.

## 2013-11-25 LAB — BASIC METABOLIC PANEL
BUN: 6 mg/dL (ref 4–21)
Creatinine: 0.5 mg/dL (ref <=1.1)
GLUCOSE: 88 mg/dL
Potassium: 3.8 mmol/L (ref 3.4–5.3)
SODIUM: 142 mmol/L (ref 137–147)

## 2013-11-25 IMAGING — CT CT HEAD W/O CM
2 series · 17 of 30 positions shown, 20 images · non-contrast
Comparison: 05/21/2012

CLINICAL DATA: Altered mental status, slurred speech

CT HEAD WITHOUT CONTRAST
TECHNIQUE: Contiguous axial images were obtained from the base of
the skull through the vertex without contrast.

[Series 2: head w/o · axial · non-contrast · 0.43mm/px · z∈[-139,-29]mm · 9 of 28 slices shown, 12 images]
[im 3/28  brain]
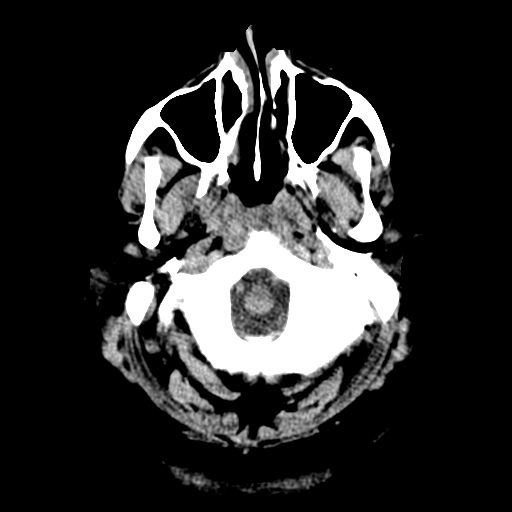
[im 3/28  bone]
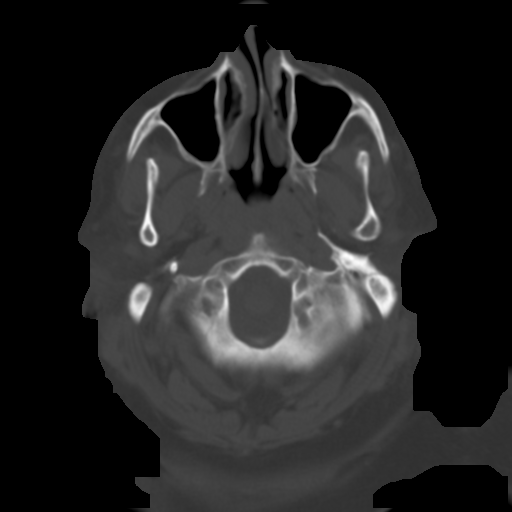
[im 6/28  brain]
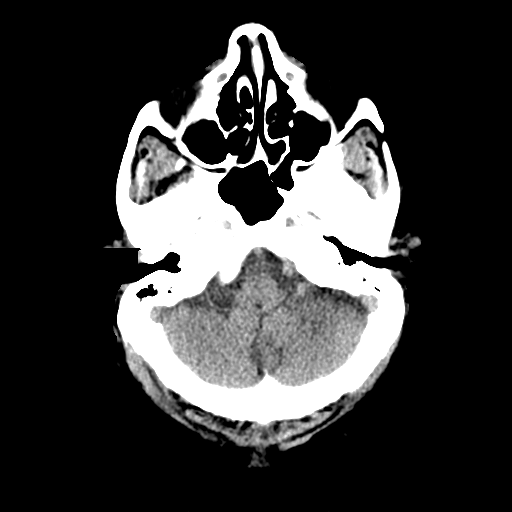
[im 9/28  brain]
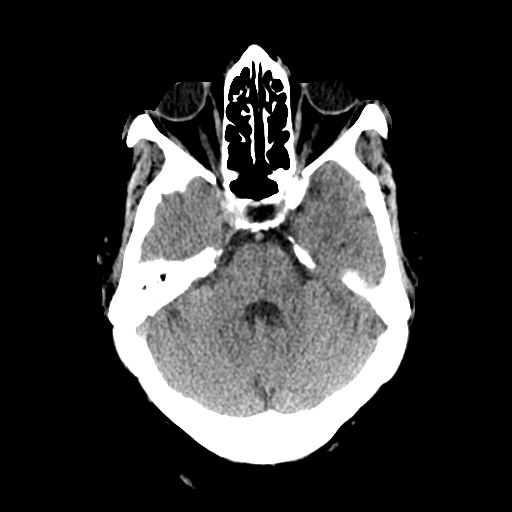
[im 11/28  brain]
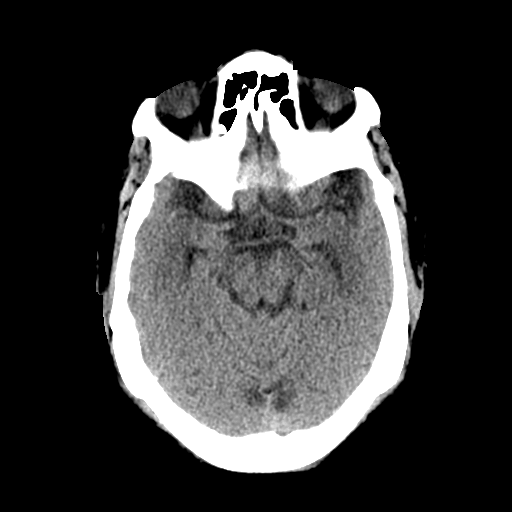
[im 14/28  brain]
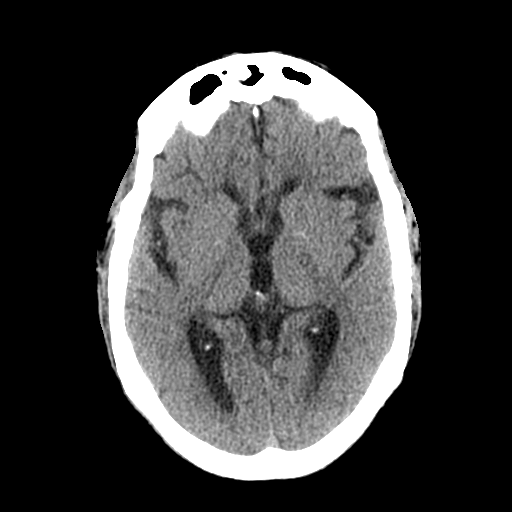
[im 14/28  bone]
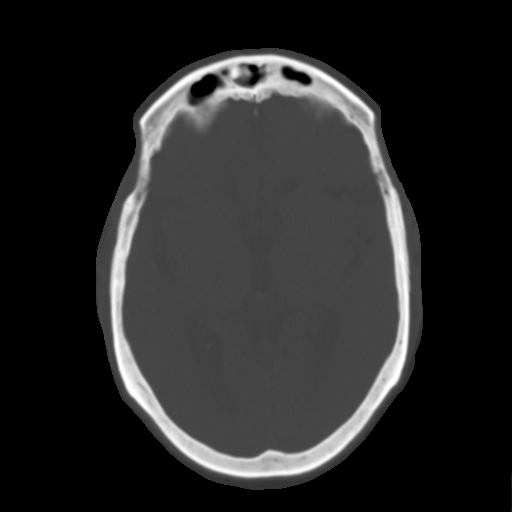
[im 17/28  brain]
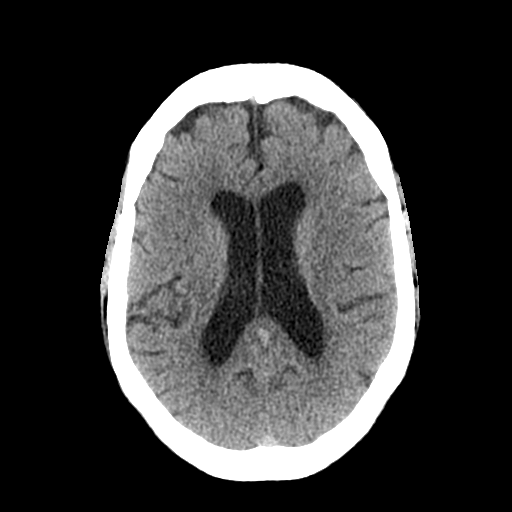
[im 19/28  brain]
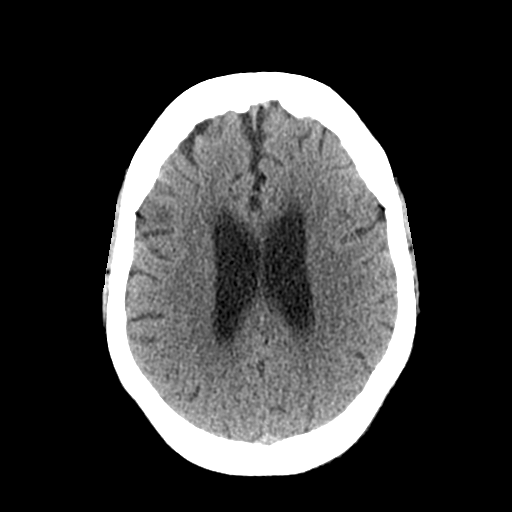
[im 22/28  brain]
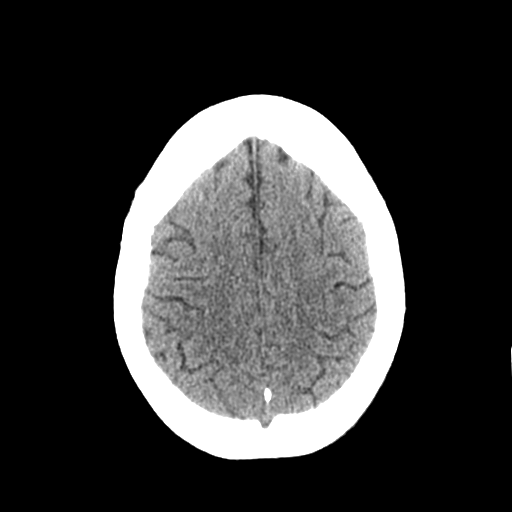
[im 25/28  brain]
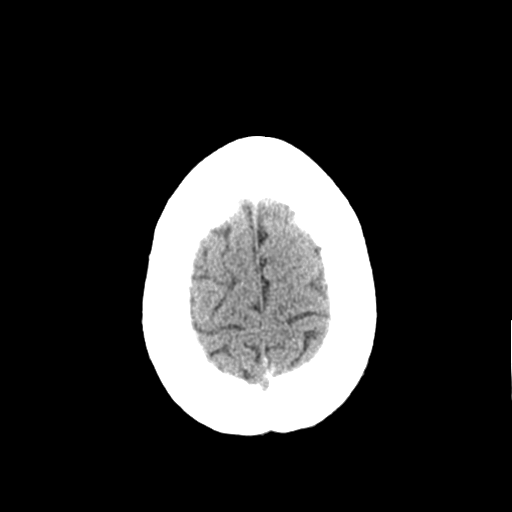
[im 25/28  bone]
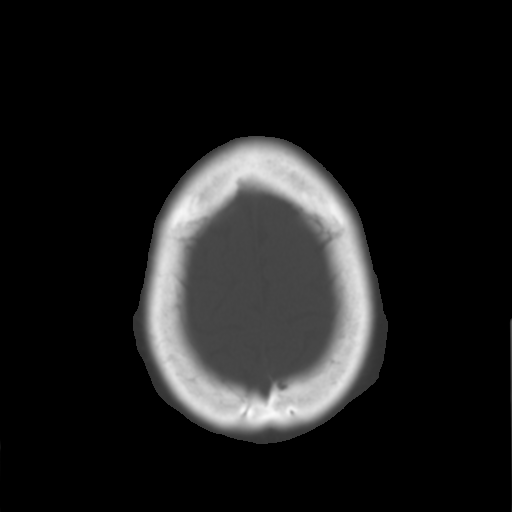

[Series 3: bone windows · axial · 0.43mm/px · z∈[-134,-29]mm · 8 of 47 slices shown]
[im 6/47  bone]
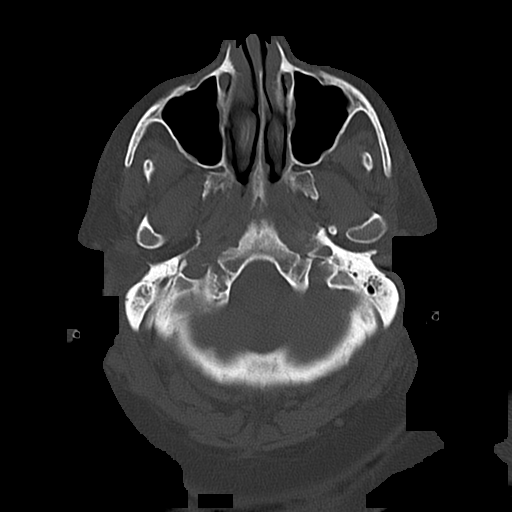
[im 11/47  bone]
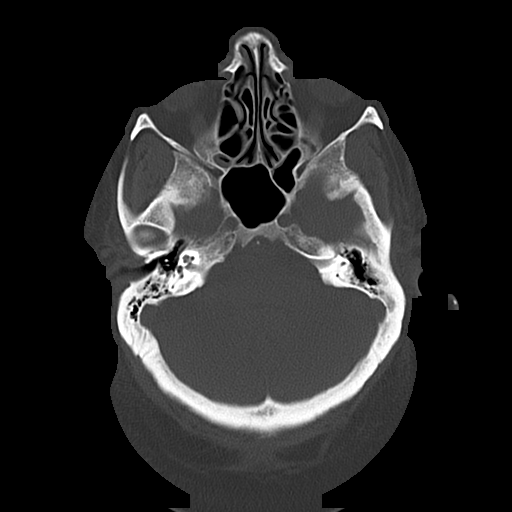
[im 16/47  bone]
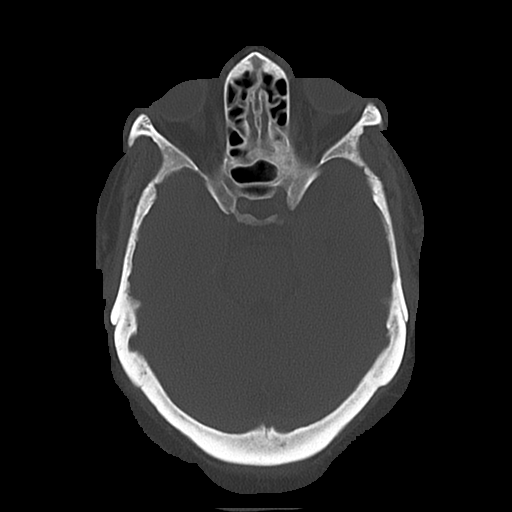
[im 21/47  bone]
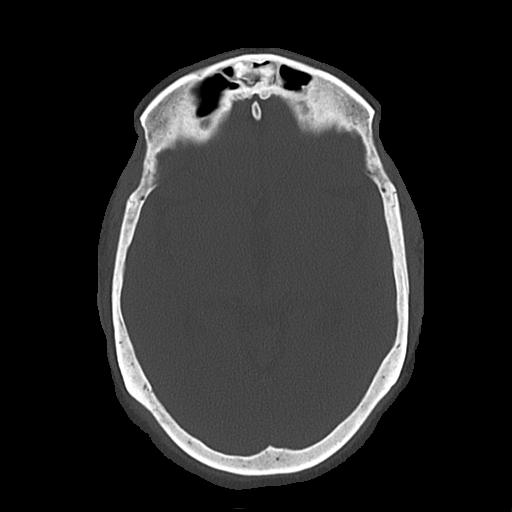
[im 26/47  bone]
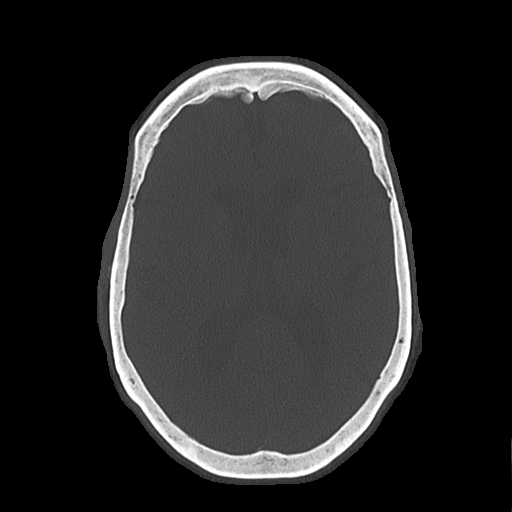
[im 31/47  bone]
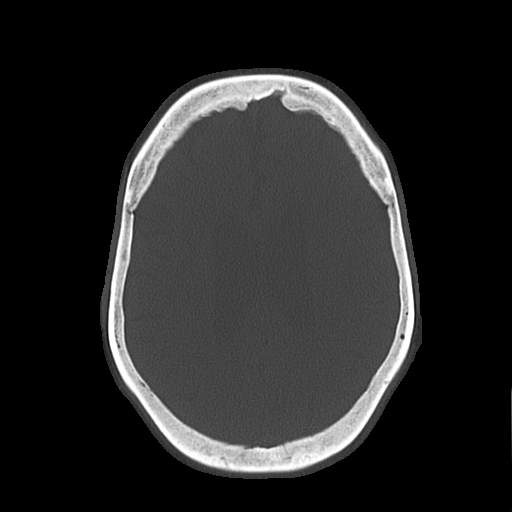
[im 36/47  bone]
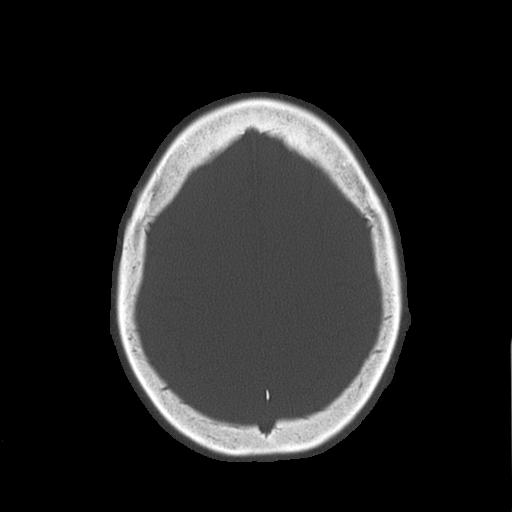
[im 41/47  bone]
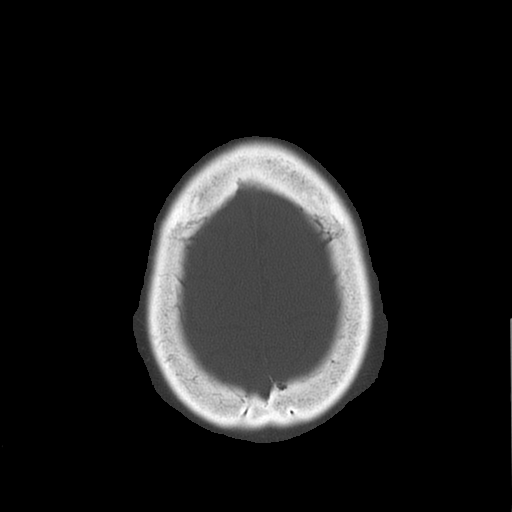

[17 of 30 positions shown; findings below may reference images not displayed]

FINDINGS: No evidence of parenchymal hemorrhage or extra-axial
fluid collection. No mass lesion, mass effect, or midline shift.

No CT evidence of acute infarction.

Cerebral volume is age appropriate.  No ventriculomegaly.

The visualized paranasal sinuses are essentially clear. The mastoid
air cells are unopacified.

Stable osteoma in the frontal sinus (series 3/image 20).

No evidence of calvarial fracture.
IMPRESSION: No evidence of acute intracranial abnormality.

## 2013-11-29 IMAGING — CR DG CHEST 1V PORT
1 series · 1 of 1 positions shown · non-contrast
Comparison: Chest radiograph performed 07/27/2012

CLINICAL DATA: Weakness and shortness of breath.

PORTABLE CHEST - 1 VIEW

[AP]
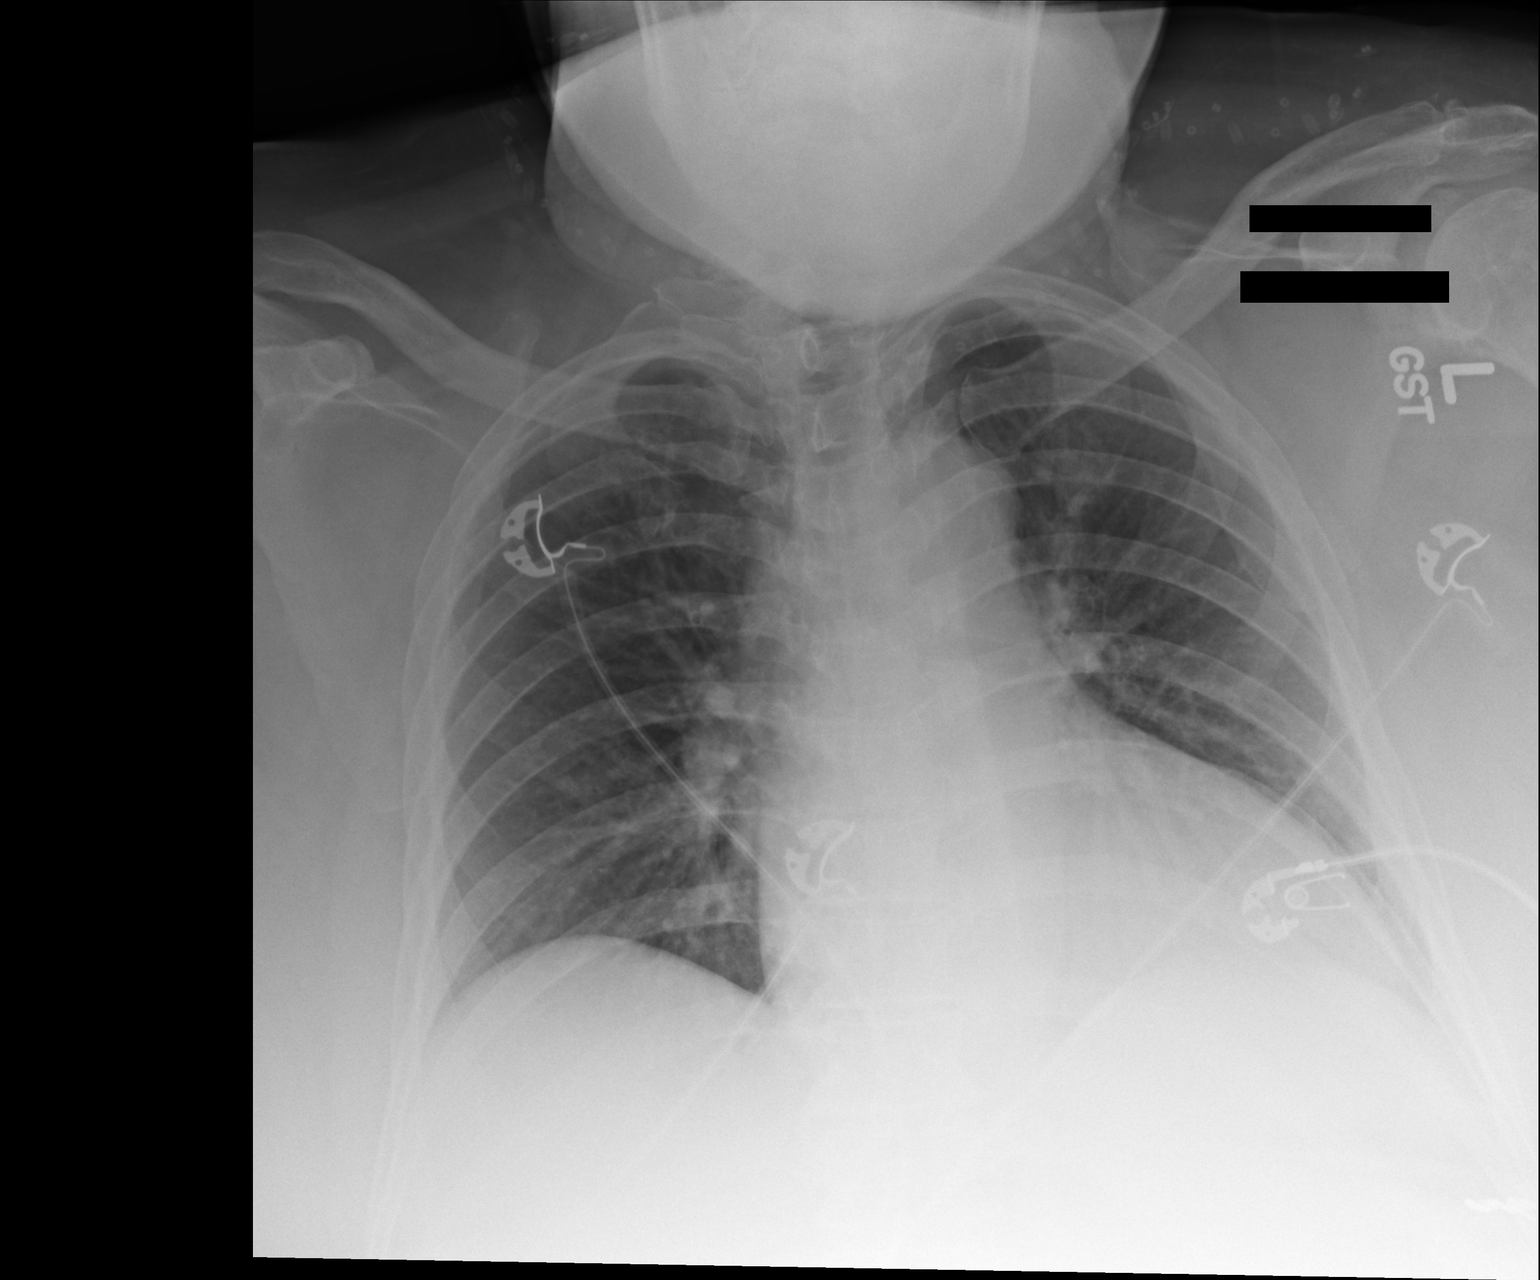

[1 of 1 positions shown; findings below may reference images not displayed]

FINDINGS: The lungs are well-aerated.  Pulmonary vascularity is at
the upper limits of normal.  There is no evidence of focal
opacification, pleural effusion or pneumothorax.

The cardiomediastinal silhouette is mildly enlarged.  No acute
osseous abnormalities are seen.
IMPRESSION: No acute cardiopulmonary process seen; mild cardiomegaly noted.

## 2013-12-04 ENCOUNTER — Non-Acute Institutional Stay (SKILLED_NURSING_FACILITY): Payer: Medicare Other | Admitting: Internal Medicine

## 2013-12-04 ENCOUNTER — Encounter: Payer: Self-pay | Admitting: Internal Medicine

## 2013-12-04 DIAGNOSIS — E119 Type 2 diabetes mellitus without complications: Secondary | ICD-10-CM

## 2013-12-04 DIAGNOSIS — H1033 Unspecified acute conjunctivitis, bilateral: Secondary | ICD-10-CM

## 2013-12-04 DIAGNOSIS — F259 Schizoaffective disorder, unspecified: Secondary | ICD-10-CM

## 2013-12-04 DIAGNOSIS — H10029 Other mucopurulent conjunctivitis, unspecified eye: Secondary | ICD-10-CM

## 2013-12-04 IMAGING — CT CT ANGIO CHEST
2 of 6 series · 19 of 46 positions shown · IV contrast (APPLIED)
Comparison: Chest x-ray from the same day.

CLINICAL DATA: Shortness of breath.  Rule out pulmonary embolus.
Hemoptysis.

CT ANGIOGRAPHY CHEST
TECHNIQUE: Multidetector CT imaging of the chest using the
standard protocol during bolus administration of intravenous
contrast. Multiplanar reconstructed images including MIPs were
obtained and reviewed to evaluate the vascular anatomy.
Contrast: 100mL OMNIPAQUE IOHEXOL 350 MG/ML SOLN

[Series 7: pulm embolism 1.0 b25f thin · axial · 0.83mm/px · z∈[-245,-57]mm · 16 of 206 slices shown]
[im 9/206  lung]
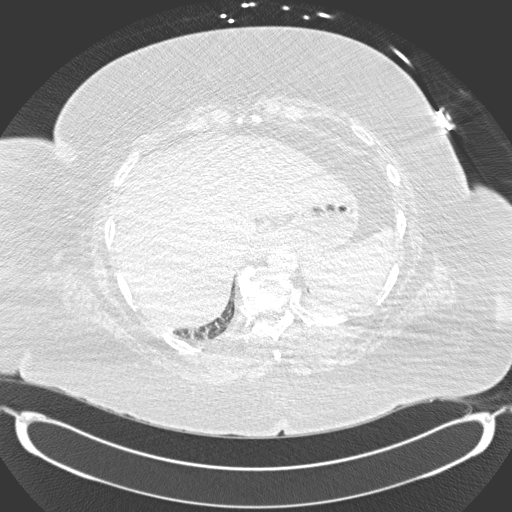
[im 27/206  soft-tissue]
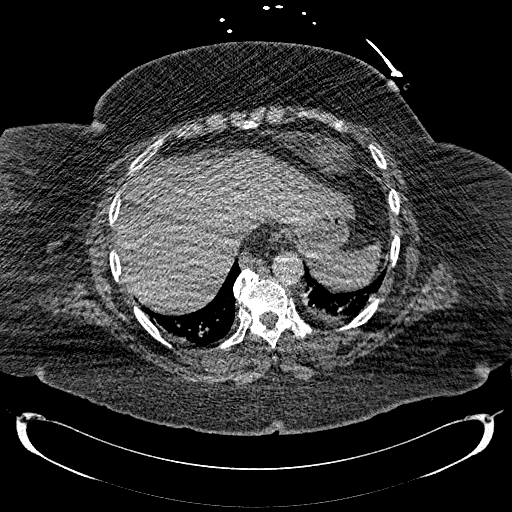
[im 36/206  lung]
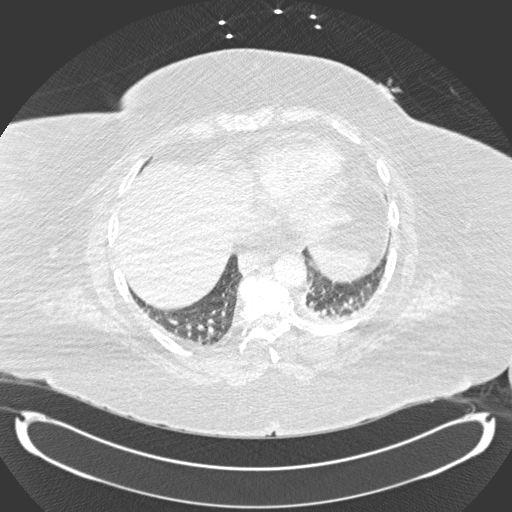
[im 45/206  soft-tissue]
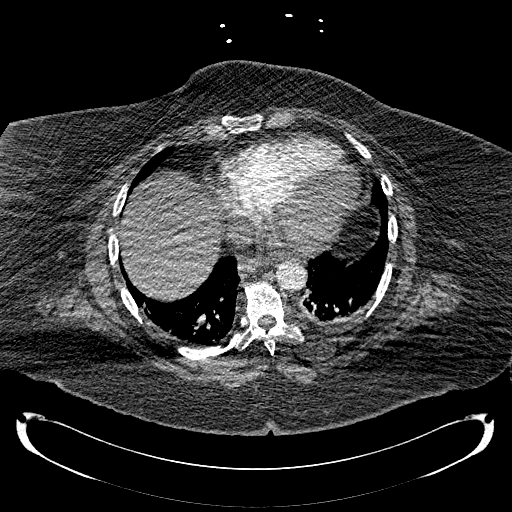
[im 63/206  lung]
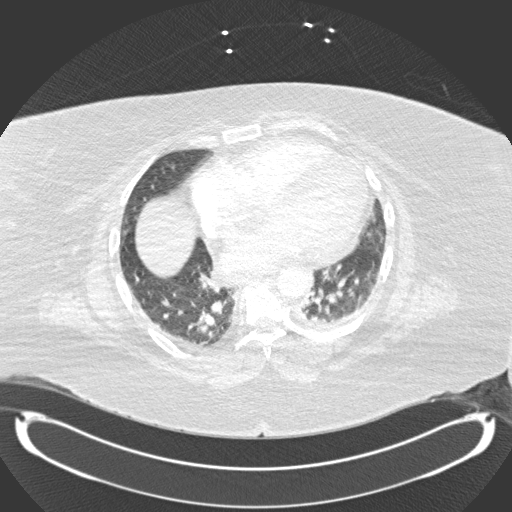
[im 72/206  soft-tissue]
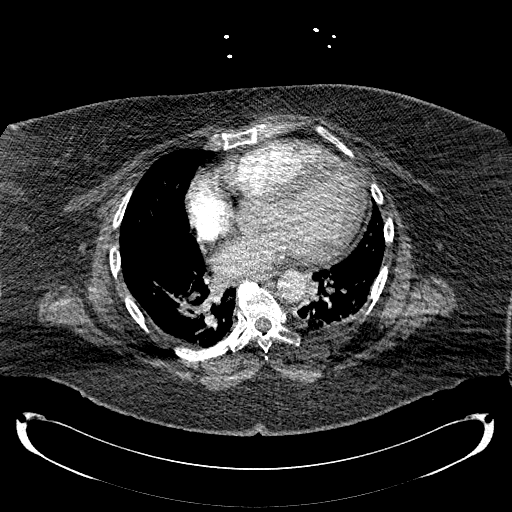
[im 81/206  lung]
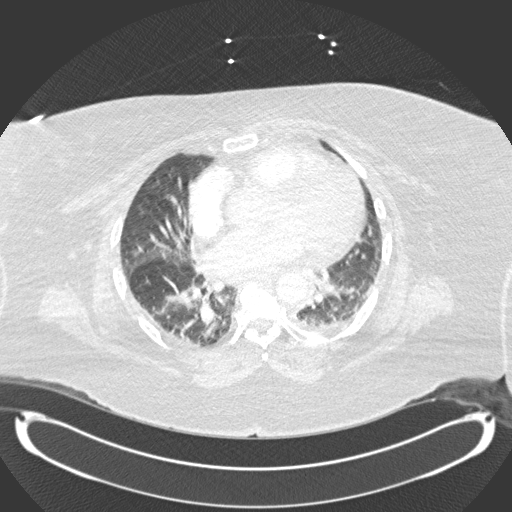
[im 99/206  soft-tissue]
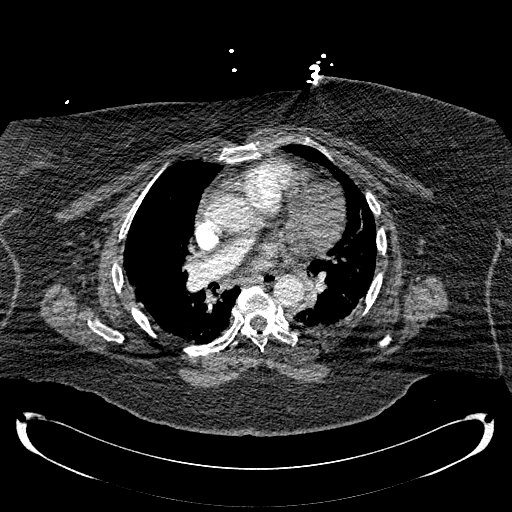
[im 107/206  lung]
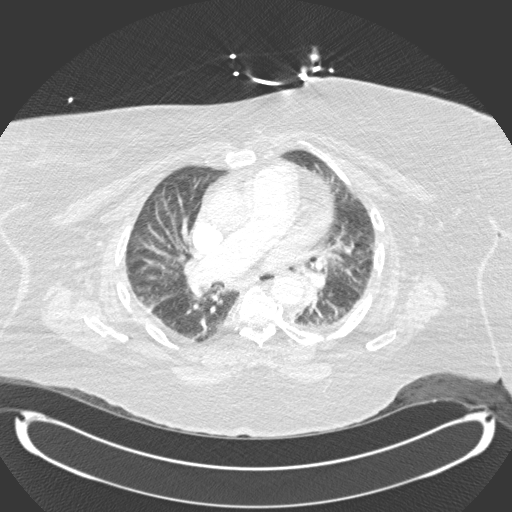
[im 125/206  soft-tissue]
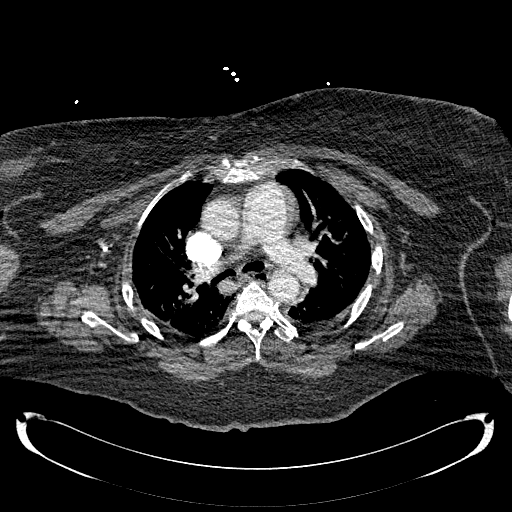
[im 134/206  lung]
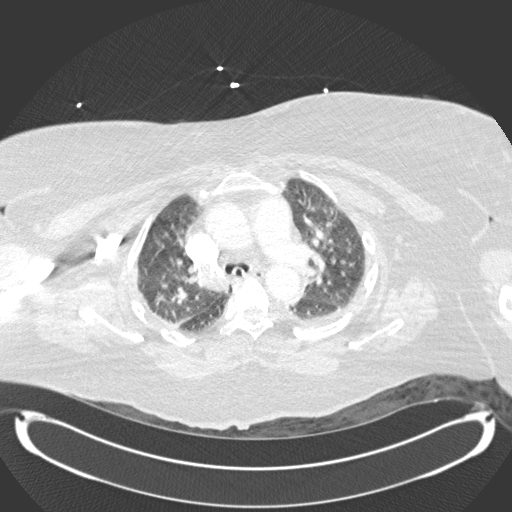
[im 143/206  soft-tissue]
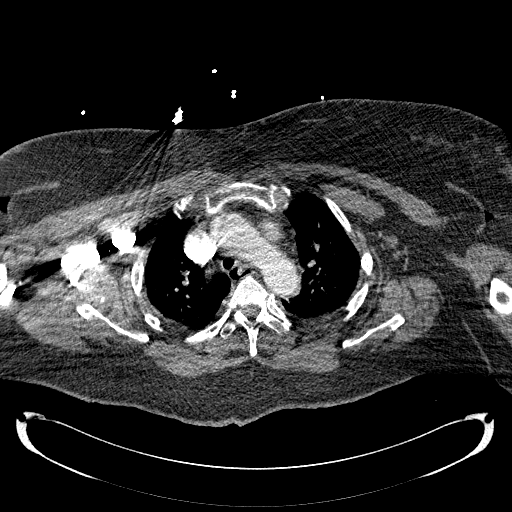
[im 161/206  lung]
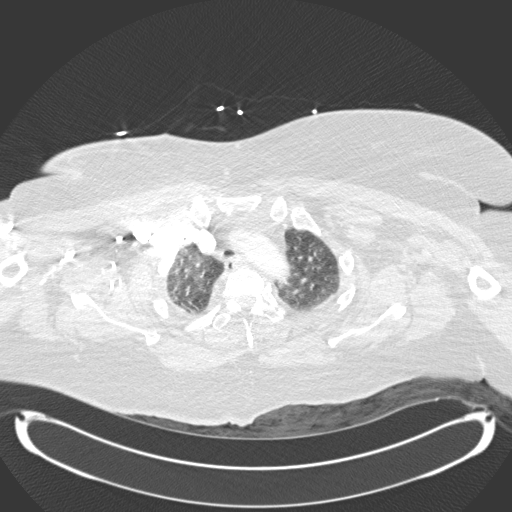
[im 170/206  soft-tissue]
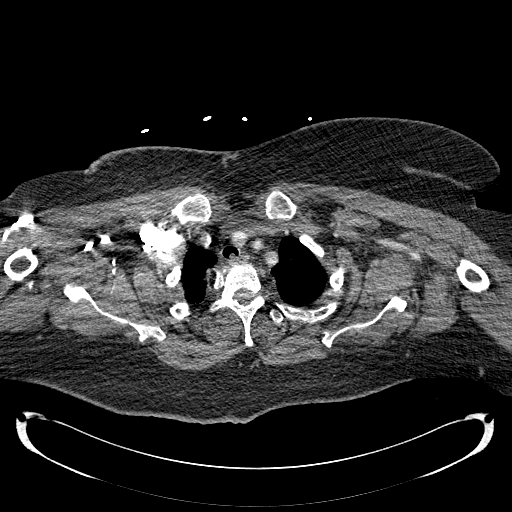
[im 179/206  lung]
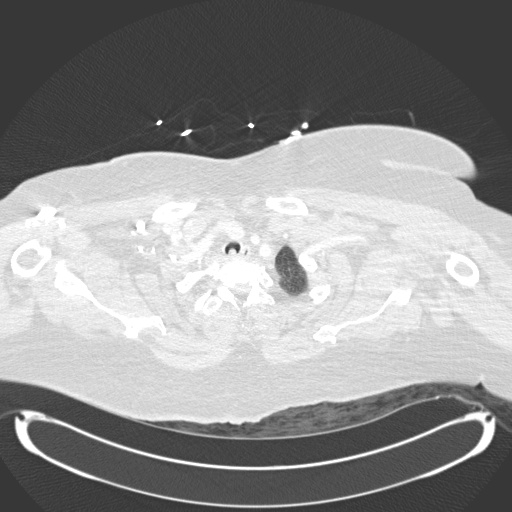
[im 197/206  soft-tissue]
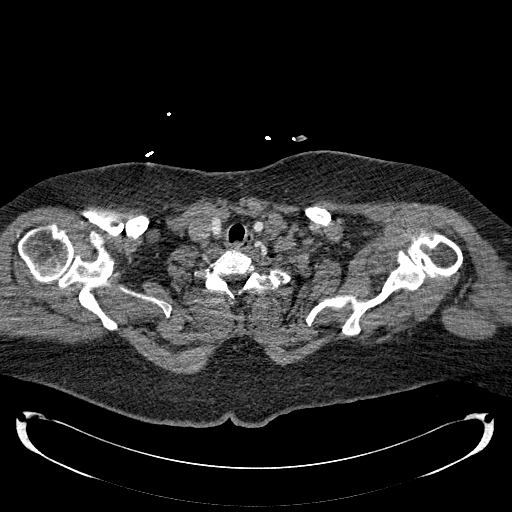

[Series 604: cor · coronal · 0.83mm/px · 3 of 103 slices shown]
[im 26/103  soft-tissue]
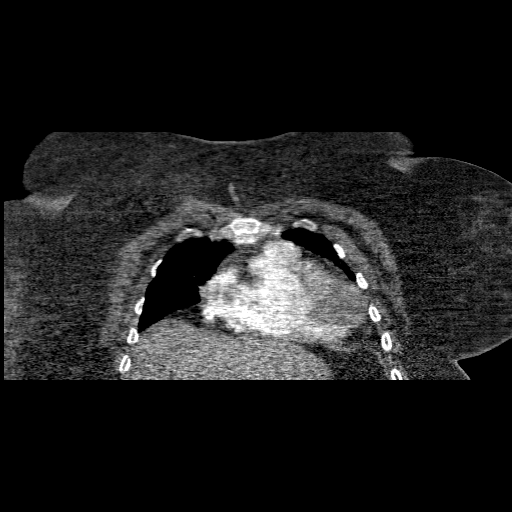
[im 52/103  soft-tissue]
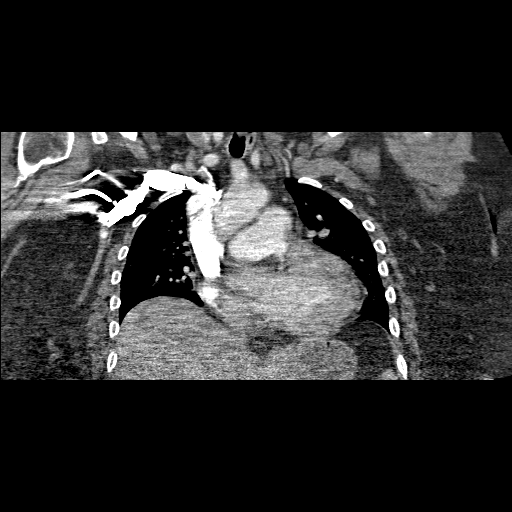
[im 77/103  soft-tissue]
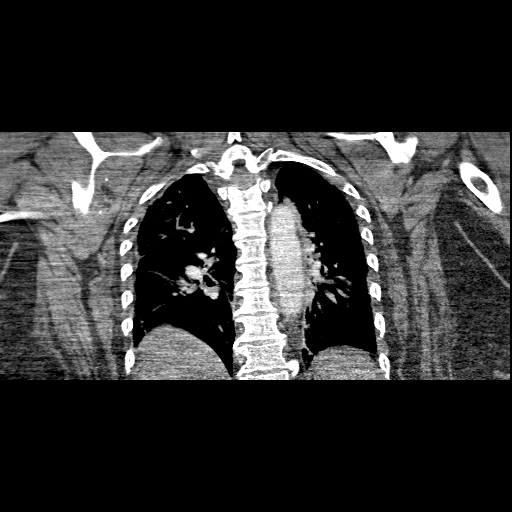

[19 of 46 positions shown; findings below may reference images not displayed]

FINDINGS: Pulmonary arterial opacification is satisfactory.  There
is significant streak artifact secondary patient body habitus.  No
focal filling defects are evident to suggest pulmonary embolus.
The heart is enlarged.  No significant pleural or pericardial
effusion is present.

No significant mediastinal or axillary adenopathy is present.
Limited imaging of the upper abdomen is unremarkable.

The mild dependent atelectasis is present bilaterally.  There is
some ground-glass attenuation suggesting edema.  The bone windows
demonstrate mild endplate degenerative change.
IMPRESSION: 1.  No evidence of pulmonary embolus.
2.  Cardiomegaly and mild edema.
3.  Mild dependent atelectasis bilaterally.

## 2013-12-04 IMAGING — CR DG CHEST 2V
2 series · 2 of 2 positions shown · non-contrast
Comparison: 08/01/2012

CLINICAL DATA: Shortness of breath and weakness

CHEST - 2 VIEW

[w chest lat]
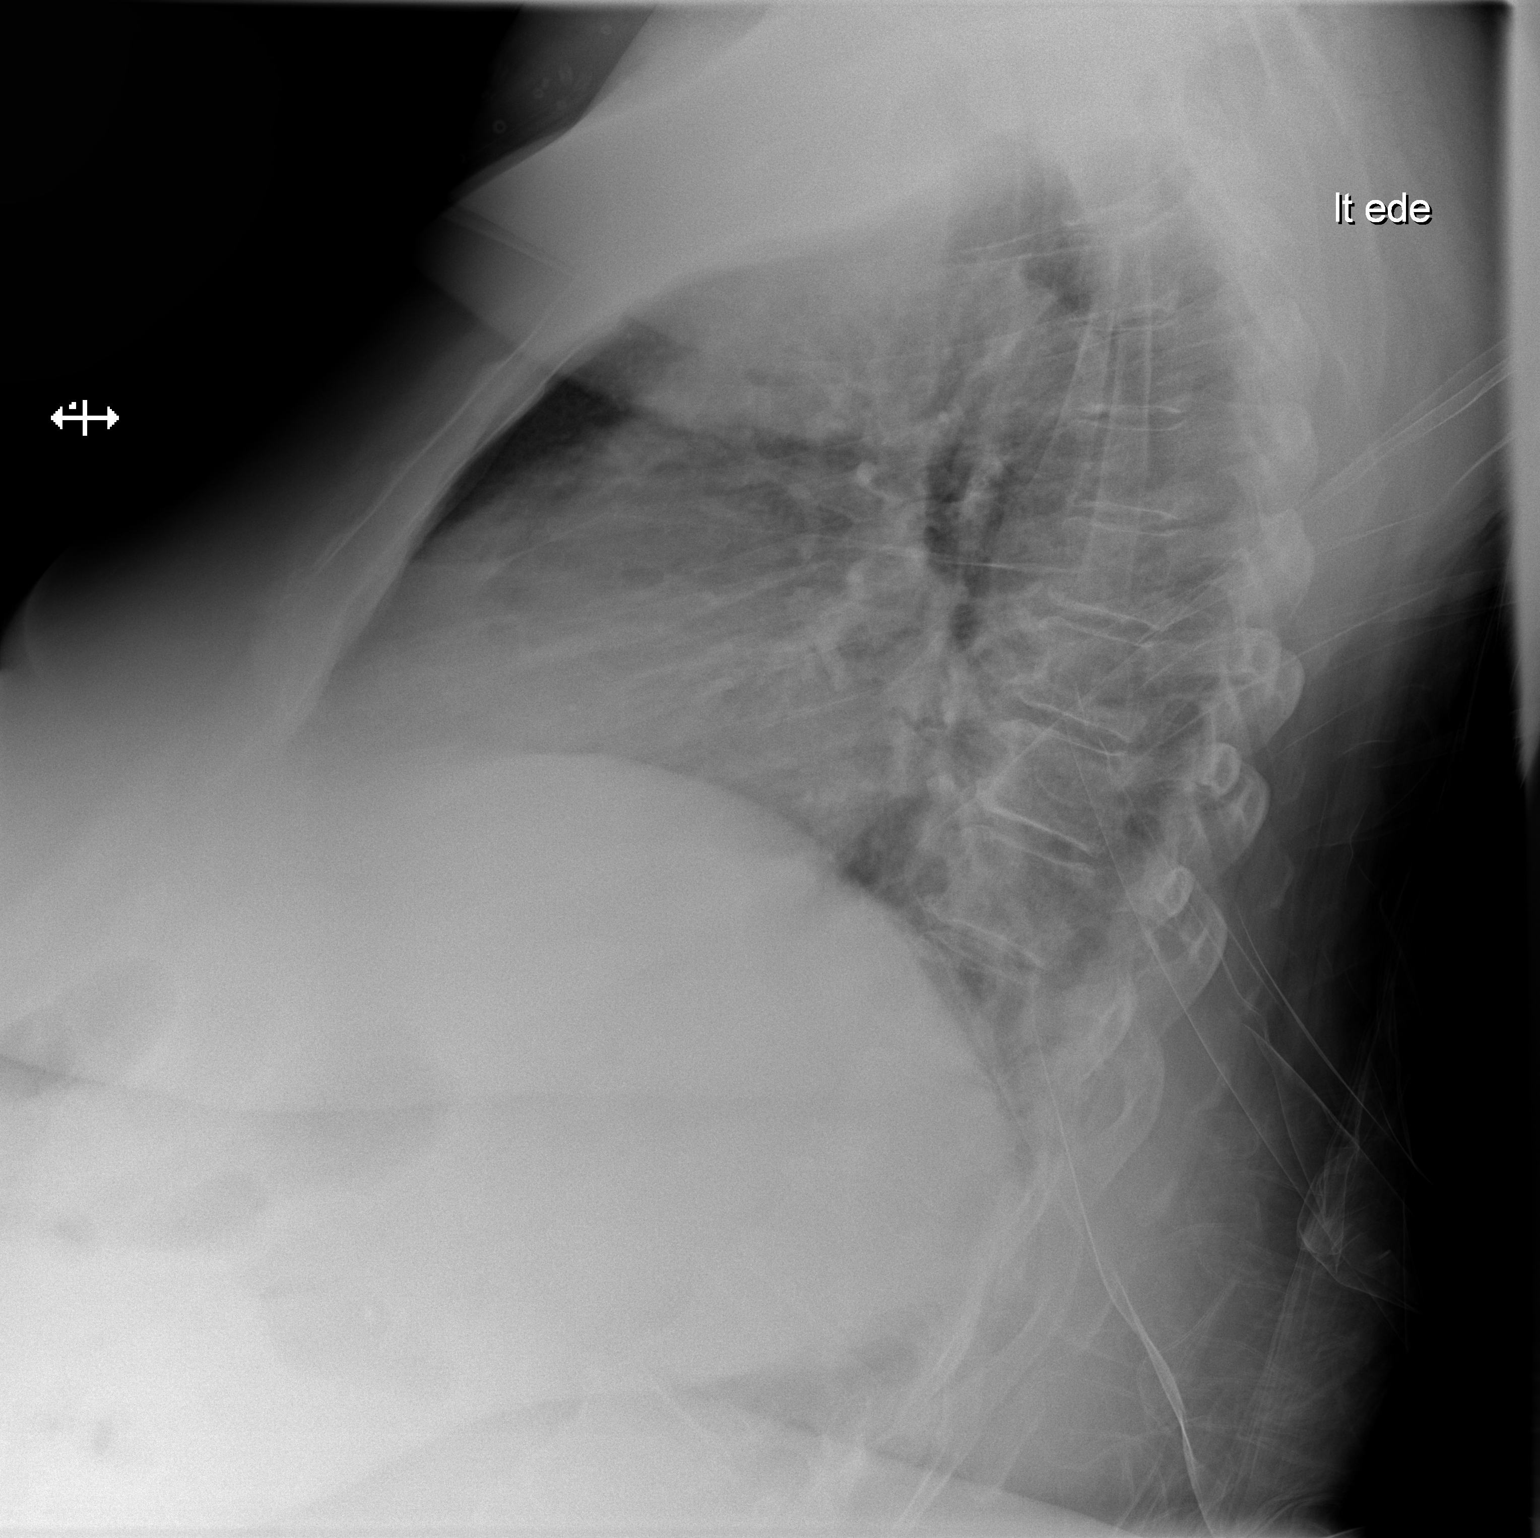

[x chest ap]
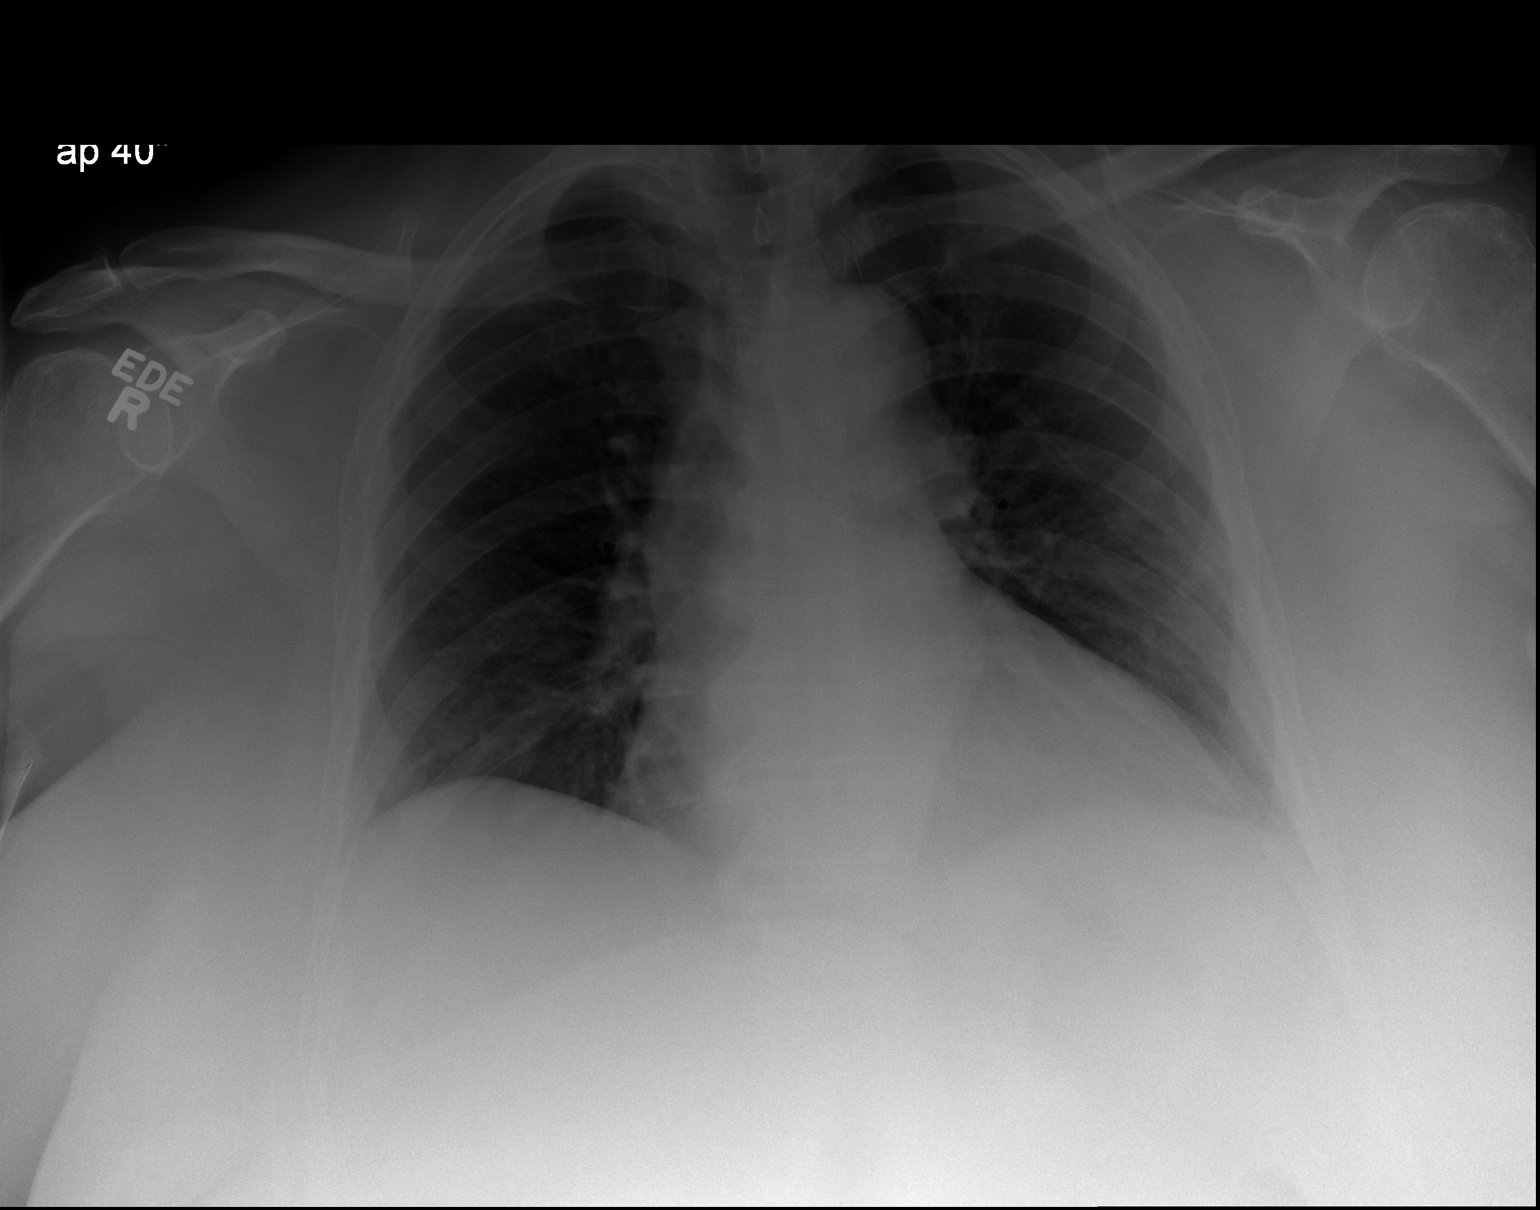

[2 of 2 positions shown; findings below may reference images not displayed]

FINDINGS: Normal heart size.  No pleural effusion or edema.  Lung
volumes are low.  There is no airspace consolidation.  Mild
spondylosis identified within the thoracic spine.
IMPRESSION: 1.  No acute cardiopulmonary abnormalities.
2.  Thoracic spondylosis.

## 2013-12-04 NOTE — Progress Notes (Signed)
Patient ID: [name], female   DOB: [birth year], 63 y.o.   MRN: [id number]  Location:  [name] Provider:  Gwenith Spitz. Renato Gails, D.O., C.M.D.  Code Status:  Full code  Chief Complaint  Patient presents with  . Acute Visit    eyes red with drainage    HPI:  63 yo long term care resident seen for acute visit due to redness of her eyes with drainage.  She was very psychotic--convinced she was dying--on life support.  At first, c/o abdominal pain, but later denied this and exam unremarkable of her abdomen. Telling me over and over that she is her own power of attorney and I cannot commit her even though she is not in a psychiatric institution right now.     Review of Systems:  Review of Systems  HENT: Negative for congestion.   Eyes: Positive for blurred vision, photophobia, pain, discharge and redness.       Itching  Respiratory: Negative for shortness of breath.   Gastrointestinal: Positive for abdominal pain. Negative for constipation, blood in stool and melena.  Genitourinary: Negative for dysuria, urgency and frequency.  Musculoskeletal: Negative for falls and myalgias.  Skin:       Sores on her back have healed  Neurological: Negative for loss of consciousness and headaches.  Endo/Heme/Allergies: Bruises/bleeds easily.  Psychiatric/Behavioral:       Delusional    Medications: Patient's Medications  New Prescriptions   No medications on file  Previous Medications   ALBUTEROL (PROVENTIL HFA;VENTOLIN HFA) 108 (90 BASE) MCG/ACT INHALER    Inhale 2 puffs into the lungs every 6 (six) hours as needed for wheezing.   ASPIRIN EC 81 MG TABLET    Take 81 mg by mouth every morning.    BISACODYL (DULCOLAX) 10 MG SUPPOSITORY    Place 10 mg rectally every 3 (three) days as needed for mild constipation or moderate constipation.   BUDESONIDE (PULMICORT) 0.25 MG/2ML NEBULIZER SOLUTION    Take 0.25 mg by nebulization 2 (two) times daily.   CHLORPROMAZINE (THORAZINE) 200 MG  TABLET    Take 0.5 tablets (100 mg total) by mouth at bedtime.   CHLORPROMAZINE (THORAZINE) 50 MG TABLET    Take 1 tablet (50 mg total) by mouth 2 (two) times daily.   DIVALPROEX (DEPAKOTE ER) 500 MG 24 HR TABLET    Take 1,000 mg by mouth at bedtime.   LAMOTRIGINE (LAMICTAL) 100 MG TABLET    Take 100 mg by mouth 2 (two) times daily.   LEVOTHYROXINE (SYNTHROID, LEVOTHROID) 150 MCG TABLET    Take 150 mcg by mouth every morning. For thyroid disease.   MAGNESIUM HYDROXIDE (MILK OF MAGNESIA) 400 MG/5ML SUSPENSION    Take 30 mLs by mouth daily as needed for mild constipation.   MULTIPLE VITAMIN (MULTIVITAMIN) TABLET    Take 1 tablet by mouth daily.   OMEPRAZOLE (PRILOSEC) 20 MG CAPSULE    Take 40 mg by mouth every morning.   Modified Medications   No medications on file  Discontinued Medications   No medications on file    Physical Exam: Filed Vitals:   12/04/13 1735  BP: 98/69  Pulse: 90  Temp: 98.9 F (37.2 C)  Resp: 20  Height: 5\' 2"  (1.575 m)  Weight: 302 lb (136.986 kg)  SpO2: 95%   Physical Exam  Constitutional:  Morbidly obese female seen resting in bed  Eyes:  Redness of conjunctivae, lids and beneath eyes, yellowish purulent drainage from both, evidence of  itching  Cardiovascular: Normal rate, regular rhythm and normal heart sounds.   Pulmonary/Chest: Effort normal and breath sounds normal.  Psychiatric:  delusional     Labs reviewed: Basic Metabolic Panel:  Recent Labs  16/03/9604/20/15 0411 11/07/13 0424 11/08/13 0800  NA 138 140 140  K 4.4 4.3 4.6  CL 103 105 102  CO2 25 26 27   GLUCOSE 62* 72 91  BUN 11 8 7   CREATININE 0.57 0.52 0.54  CALCIUM 10.4 10.8* 11.4*    Liver Function Tests:  Recent Labs  05/20/13 1840 11/05/13 1640 11/06/13 0411  AST 11 12 12   ALT 6 10 9   ALKPHOS 71 118* 103  BILITOT 0.2* 0.2* <0.2*  PROT 7.4 7.0 6.0  ALBUMIN 3.3* 3.4* 2.8*    CBC:  Recent Labs  02/21/13 1945  04/23/13 1600 04/24/13 0805  11/05/13 1640  11/06/13 0411 11/07/13 0424  WBC 11.6*  < > 6.5 6.6  < > 6.6 6.4 7.3  NEUTROABS 8.6*  --  3.4 3.6  --   --   --   --   HGB 13.2  < > 12.8 12.2  < > 12.7 11.8* 12.0  HCT 40.0  < > 38.0 36.1  < > 41.9 38.4 39.5  MCV 100.0  < > 98.7 100.0  < > 110.6* 110.7* 110.3*  PLT 275  < > 208 192  < > 214 184 183  < > = values in this interval not displayed. Lab Results  Component Value Date   HGBA1C 5.4 07/29/2012     Assessment/Plan 1. Acute bacterial conjunctivitis of both eyes -will treat with gentamicin ointment until resolution  2. Schizo-affective psychosis -seems worse today -now on thorazine, depakote, lamictal for this -delusional as in hpi  3. Diabetes mellitus -has not had hba1c in this system since 2/14 so will need this and flp at next routine visit   Family/ staff Communication: discussed with CNAs, her nurse  Goals of care: long term care resident, full code  Labs/tests ordered:  Drug levels;  Will need hba1c, flp next routine visit

## 2013-12-05 LAB — CBC AND DIFFERENTIAL
HEMATOCRIT: 40 % (ref 36–46)
Hemoglobin: 12.1 g/dL (ref 12.0–16.0)
PLATELETS: 131 10*3/uL — AB (ref 150–399)
WBC: 7 10^3/mL

## 2013-12-13 IMAGING — CR DG CHEST 1V PORT
1 series · 1 of 1 positions shown · non-contrast
Comparison: CTA chest 08/06/2012.

CLINICAL DATA: Chest pain.

PORTABLE CHEST - 1 VIEW

[AP]
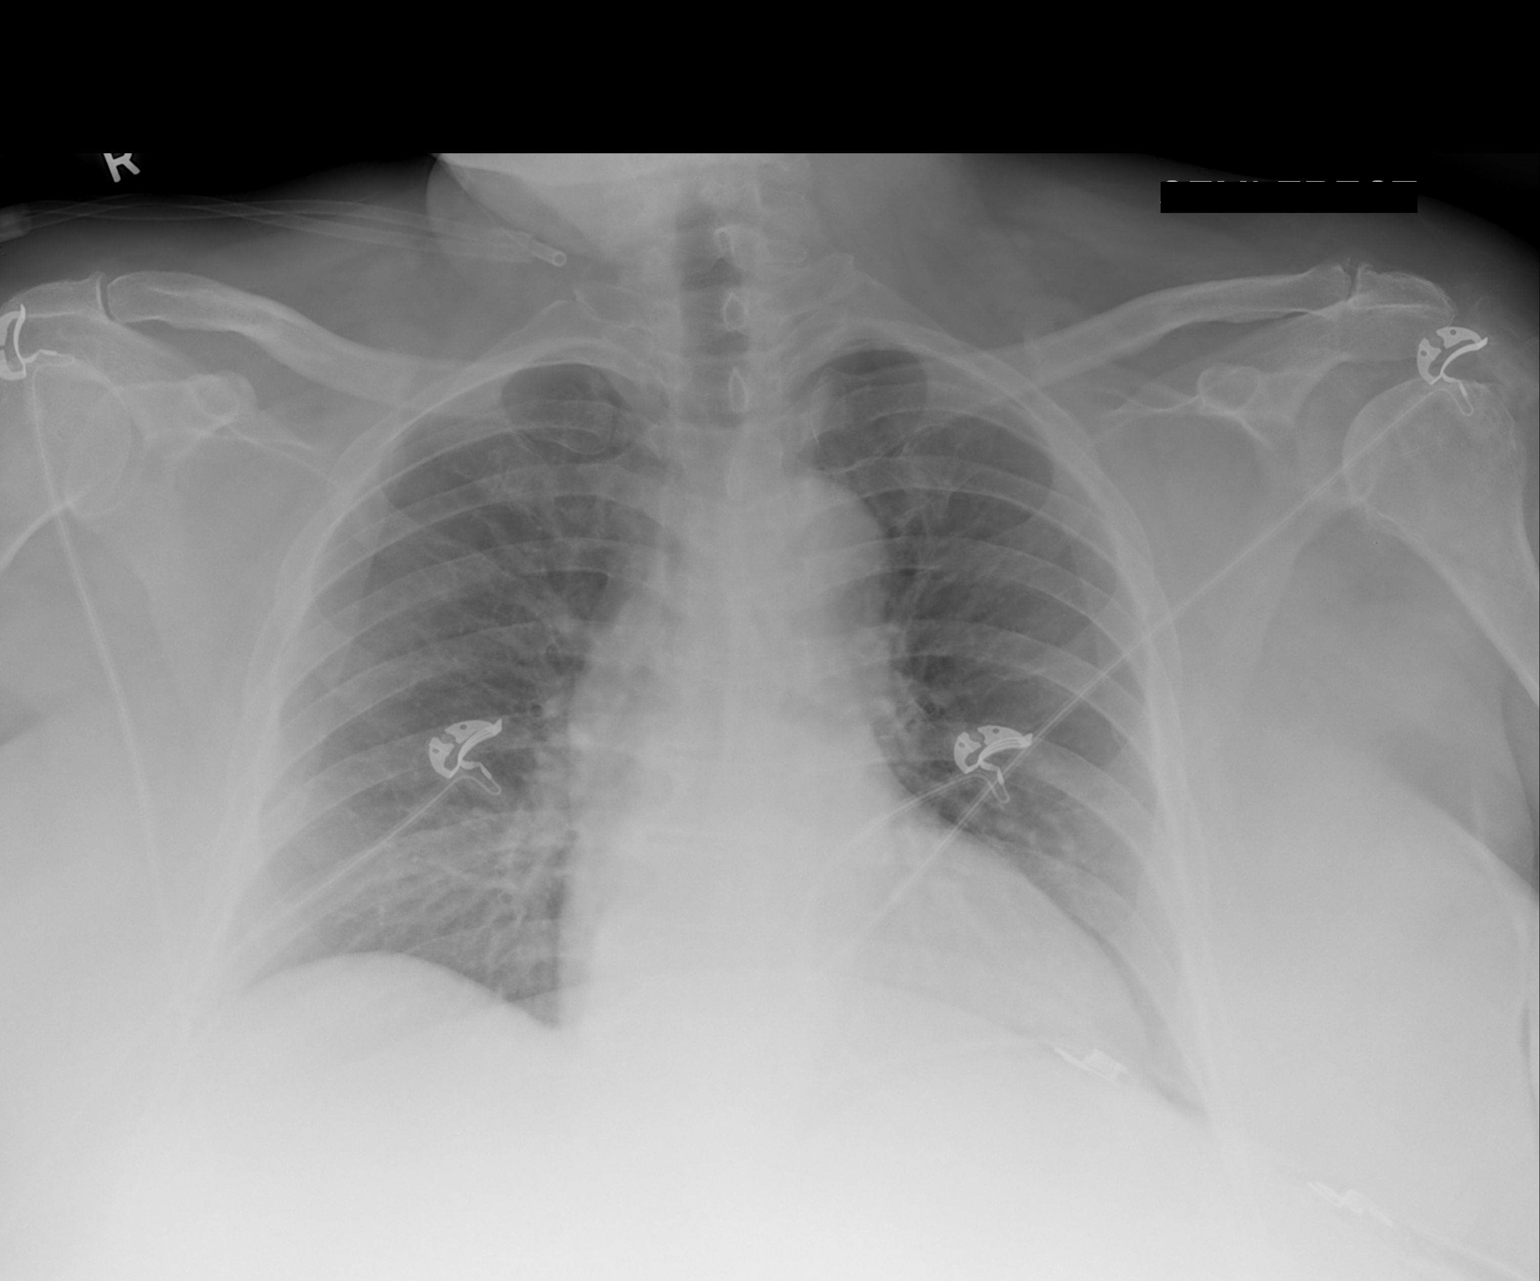

[1 of 1 positions shown; findings below may reference images not displayed]

FINDINGS: The heart size is normal.  The lungs are clear.  The
visualized soft tissues and bony thorax are unremarkable.
IMPRESSION: Negative chest.

## 2013-12-29 IMAGING — CR DG CHEST 2V
2 series · 2 of 2 positions shown · non-contrast
Comparison: 08/15/2012

CLINICAL DATA: Chest abdominal pain.  Cough.

CHEST - 2 VIEW

[w chest lat]
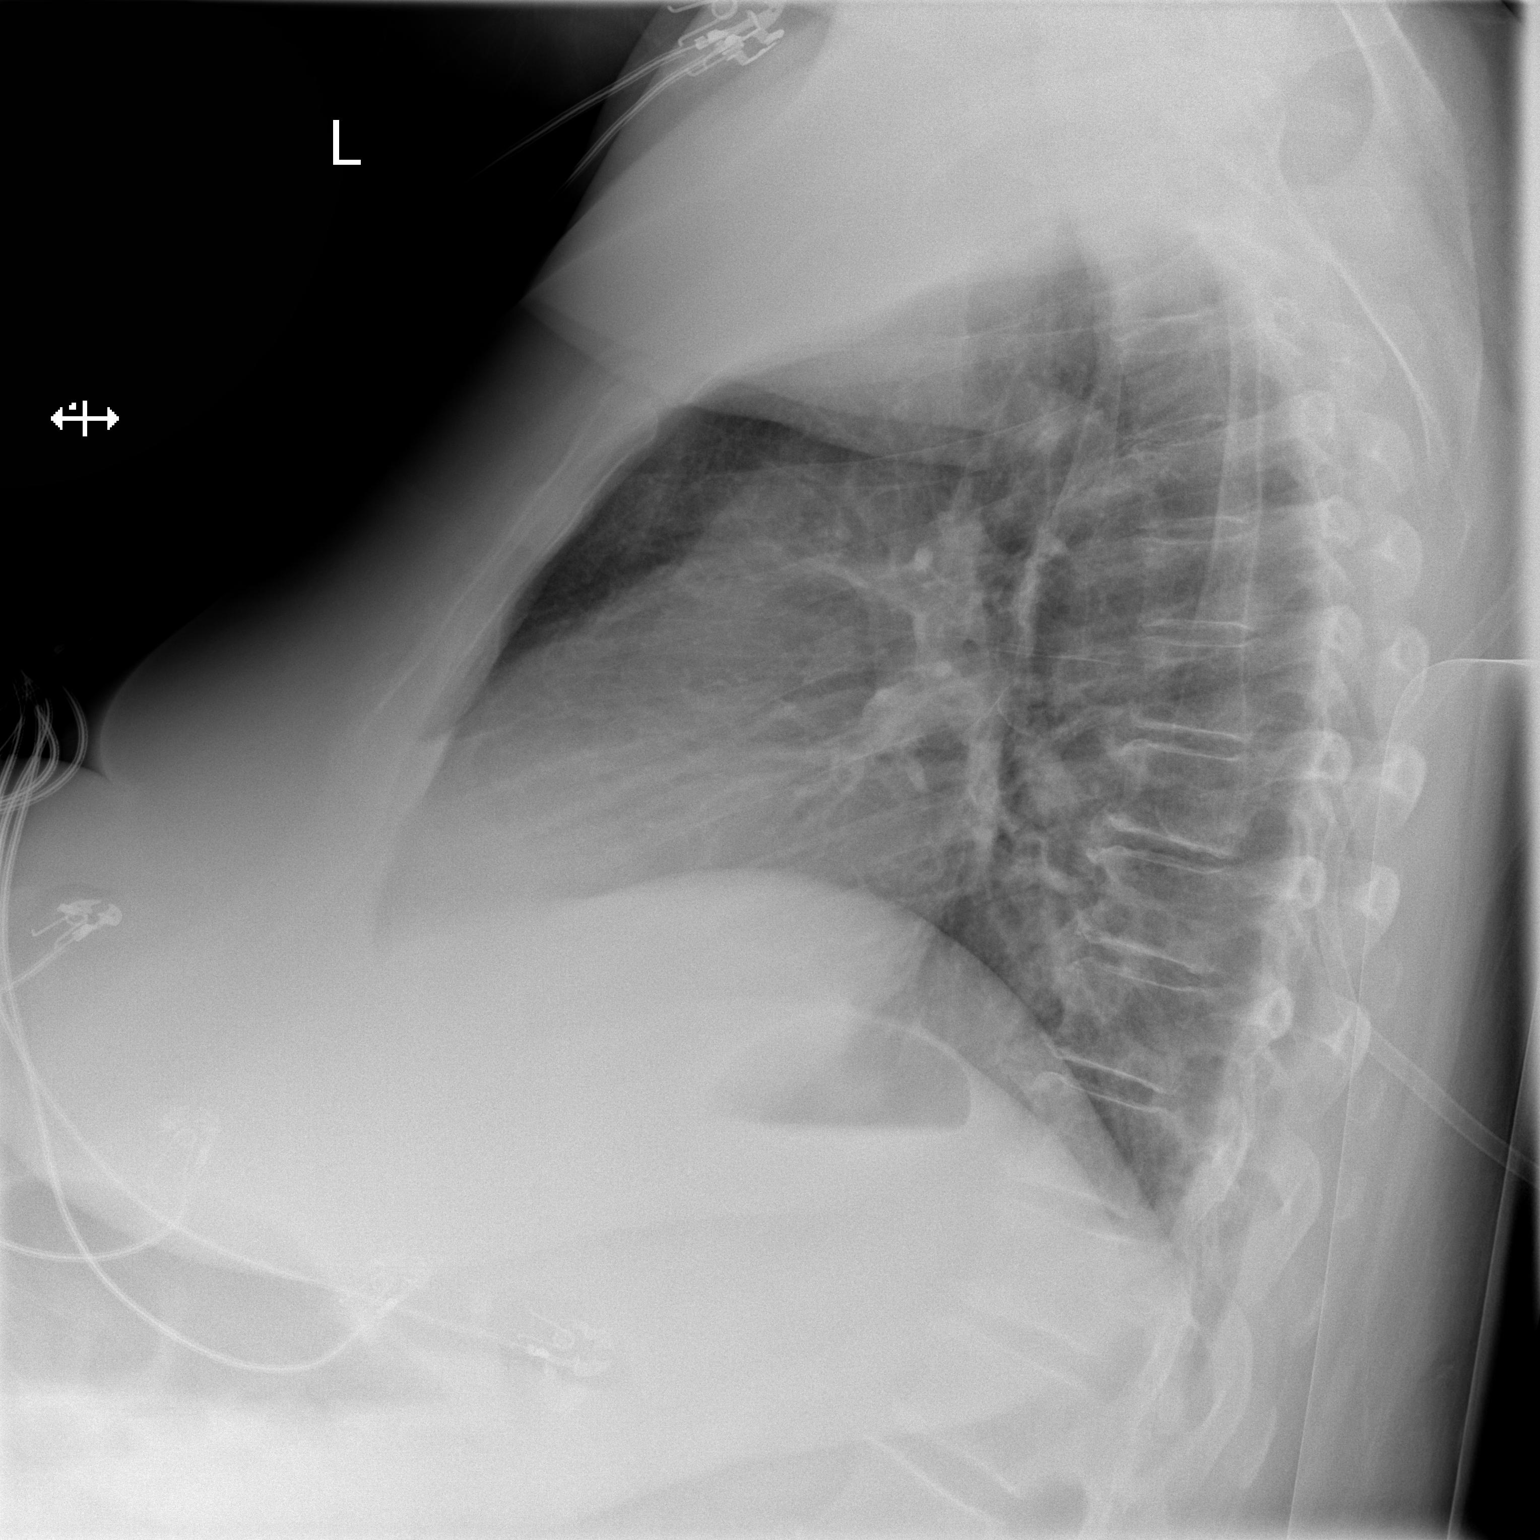

[x chest ap]
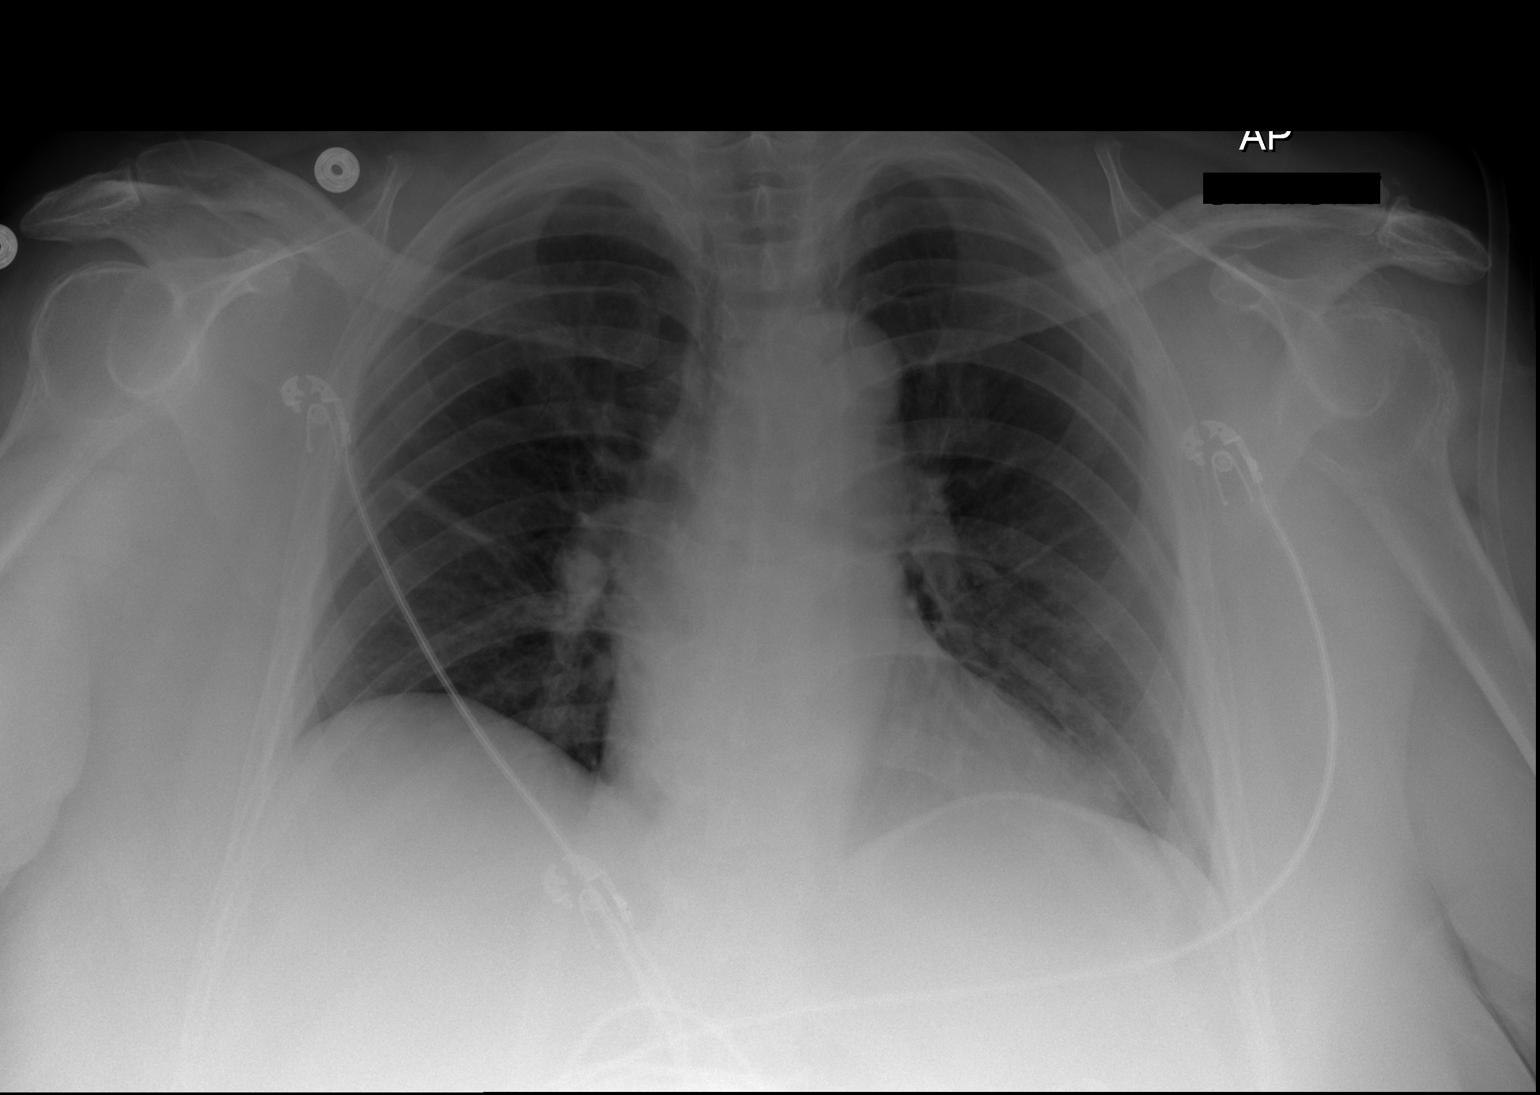

[2 of 2 positions shown; findings below may reference images not displayed]

FINDINGS: Subsegmental atelectasis is seen in the right mid lung.
No evidence of pulmonary air space disease or edema.  No evidence
of pleural effusion.  Heart size is within normal limits for low
lung volumes.  No mass or lymphadenopathy identified.
IMPRESSION: Low lung volumes with mild right midlung subsegmental atelectasis.
No evidence of pulmonary consolidation or edema.

## 2013-12-30 ENCOUNTER — Other Ambulatory Visit: Payer: Self-pay | Admitting: *Deleted

## 2013-12-30 MED ORDER — LORAZEPAM 0.5 MG PO TABS: ORAL_TABLET | ORAL | Status: DC

## 2013-12-30 NOTE — Telephone Encounter (Signed)
[facility] 

## 2013-12-31 IMAGING — CR DG CHEST 1V PORT SAME DAY
1 series · 1 of 1 positions shown · non-contrast
Comparison: 09/01/2012

CLINICAL DATA: Dyspnea.  Hypercapnic respiratory failure.

PORTABLE CHEST - 1 VIEW SAME DAY

[AP]
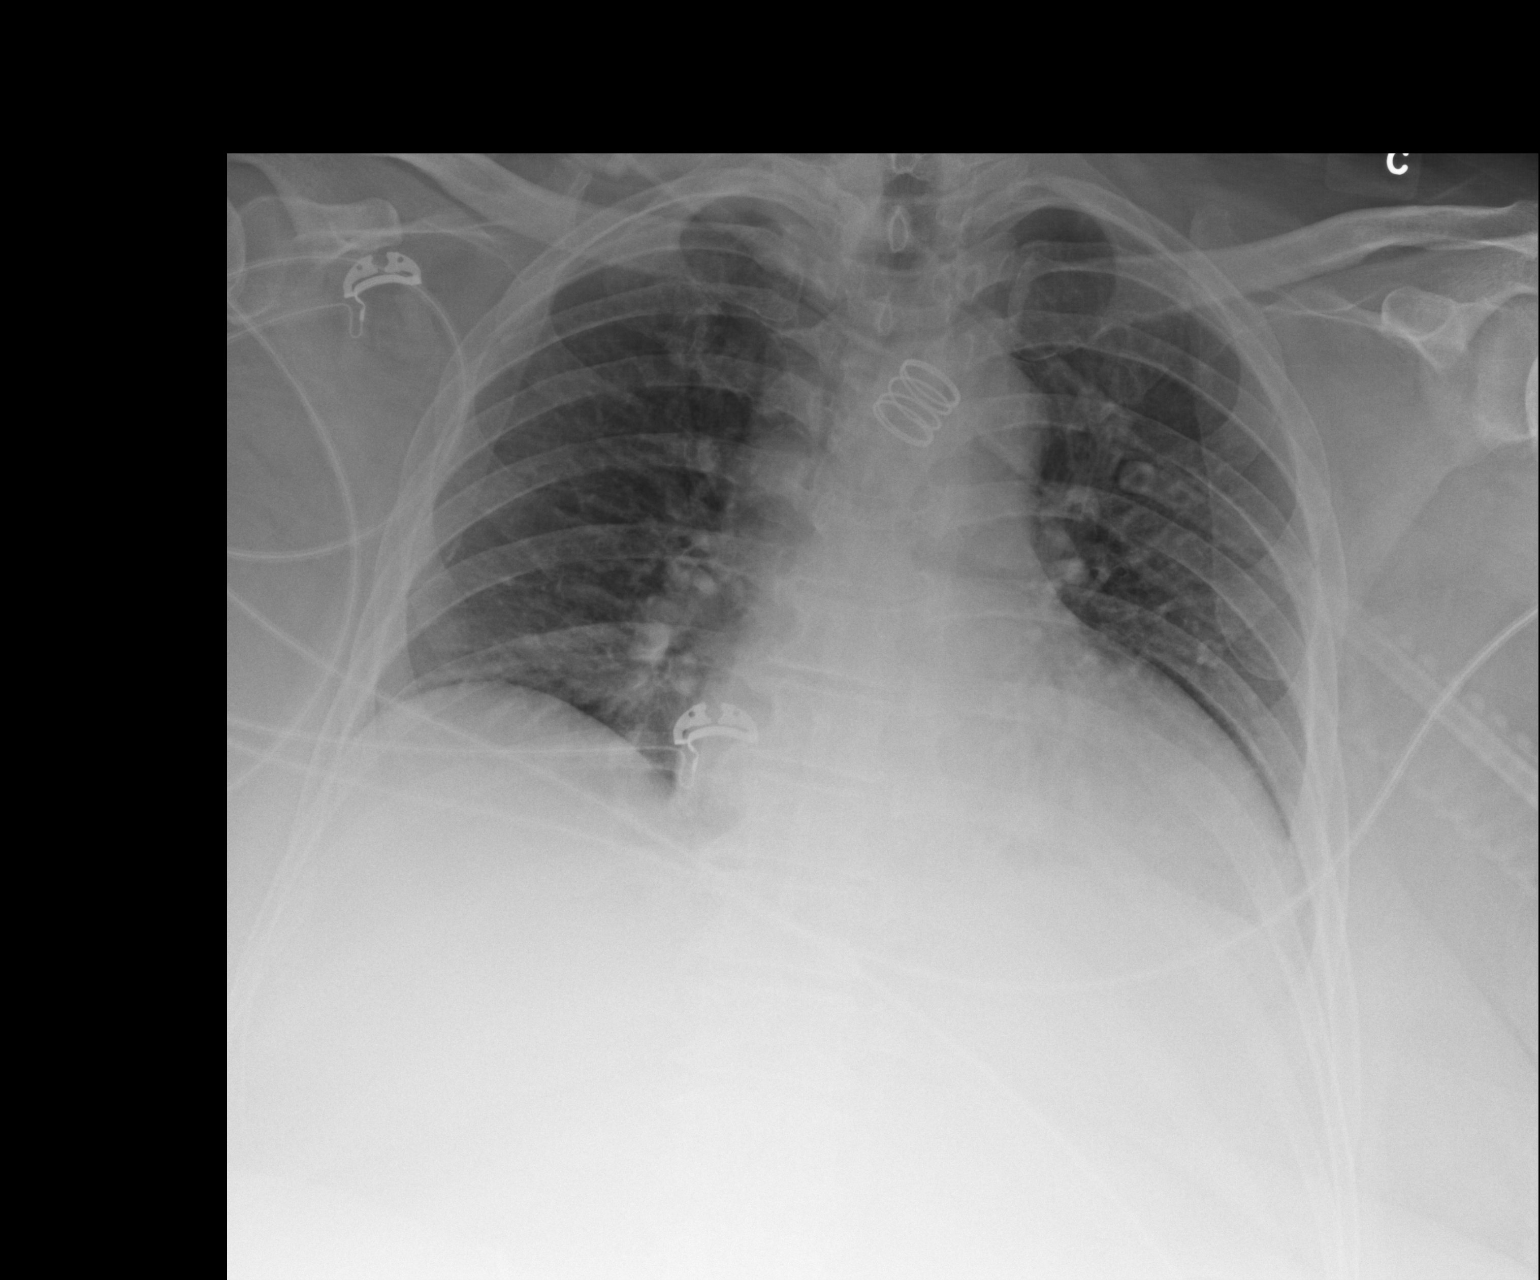

[1 of 1 positions shown; findings below may reference images not displayed]

FINDINGS: Low lung volumes are seen however both lungs are clear.
Heart size is stable.  No significant change seen compared to prior
study.
IMPRESSION: Stable low lung volumes.  No acute findings.

## 2014-01-01 IMAGING — CR DG CHEST 1V PORT
1 series · 1 of 1 positions shown · non-contrast
Comparison: 09/02/2012

CLINICAL DATA: COPD.

PORTABLE CHEST - 1 VIEW

[AP]
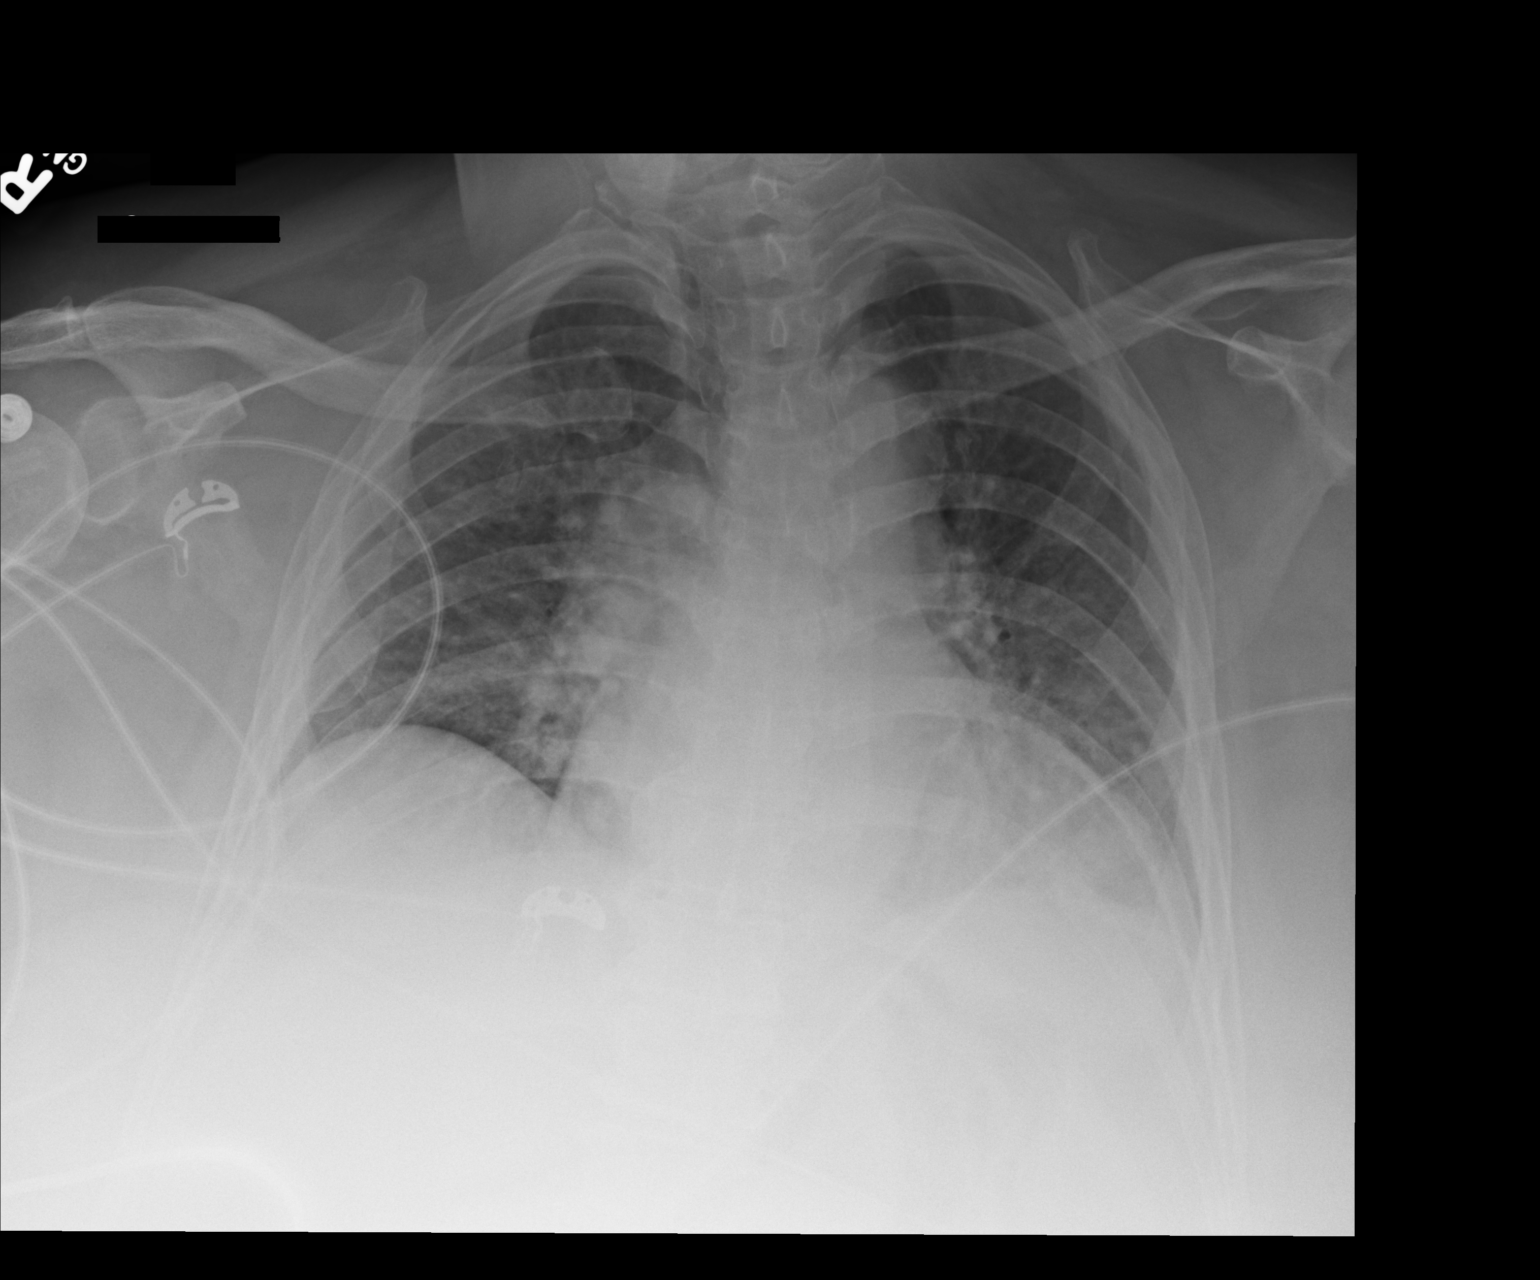

[1 of 1 positions shown; findings below may reference images not displayed]

FINDINGS: Low lung volumes are again demonstrated with mild
bibasilar atelectasis.  No evidence of pulmonary consolidation or
pleural effusion.  Cardiomegaly stable.
IMPRESSION: Stable cardiomegaly and bibasilar atelectasis.  No evidence of
pulmonary consolidation.

## 2014-01-21 IMAGING — CR DG CHEST 1V PORT
1 series · 1 of 1 positions shown · non-contrast
Comparison: 09/20/2012

CLINICAL DATA: Chest pain

PORTABLE CHEST - 1 VIEW

[AP]
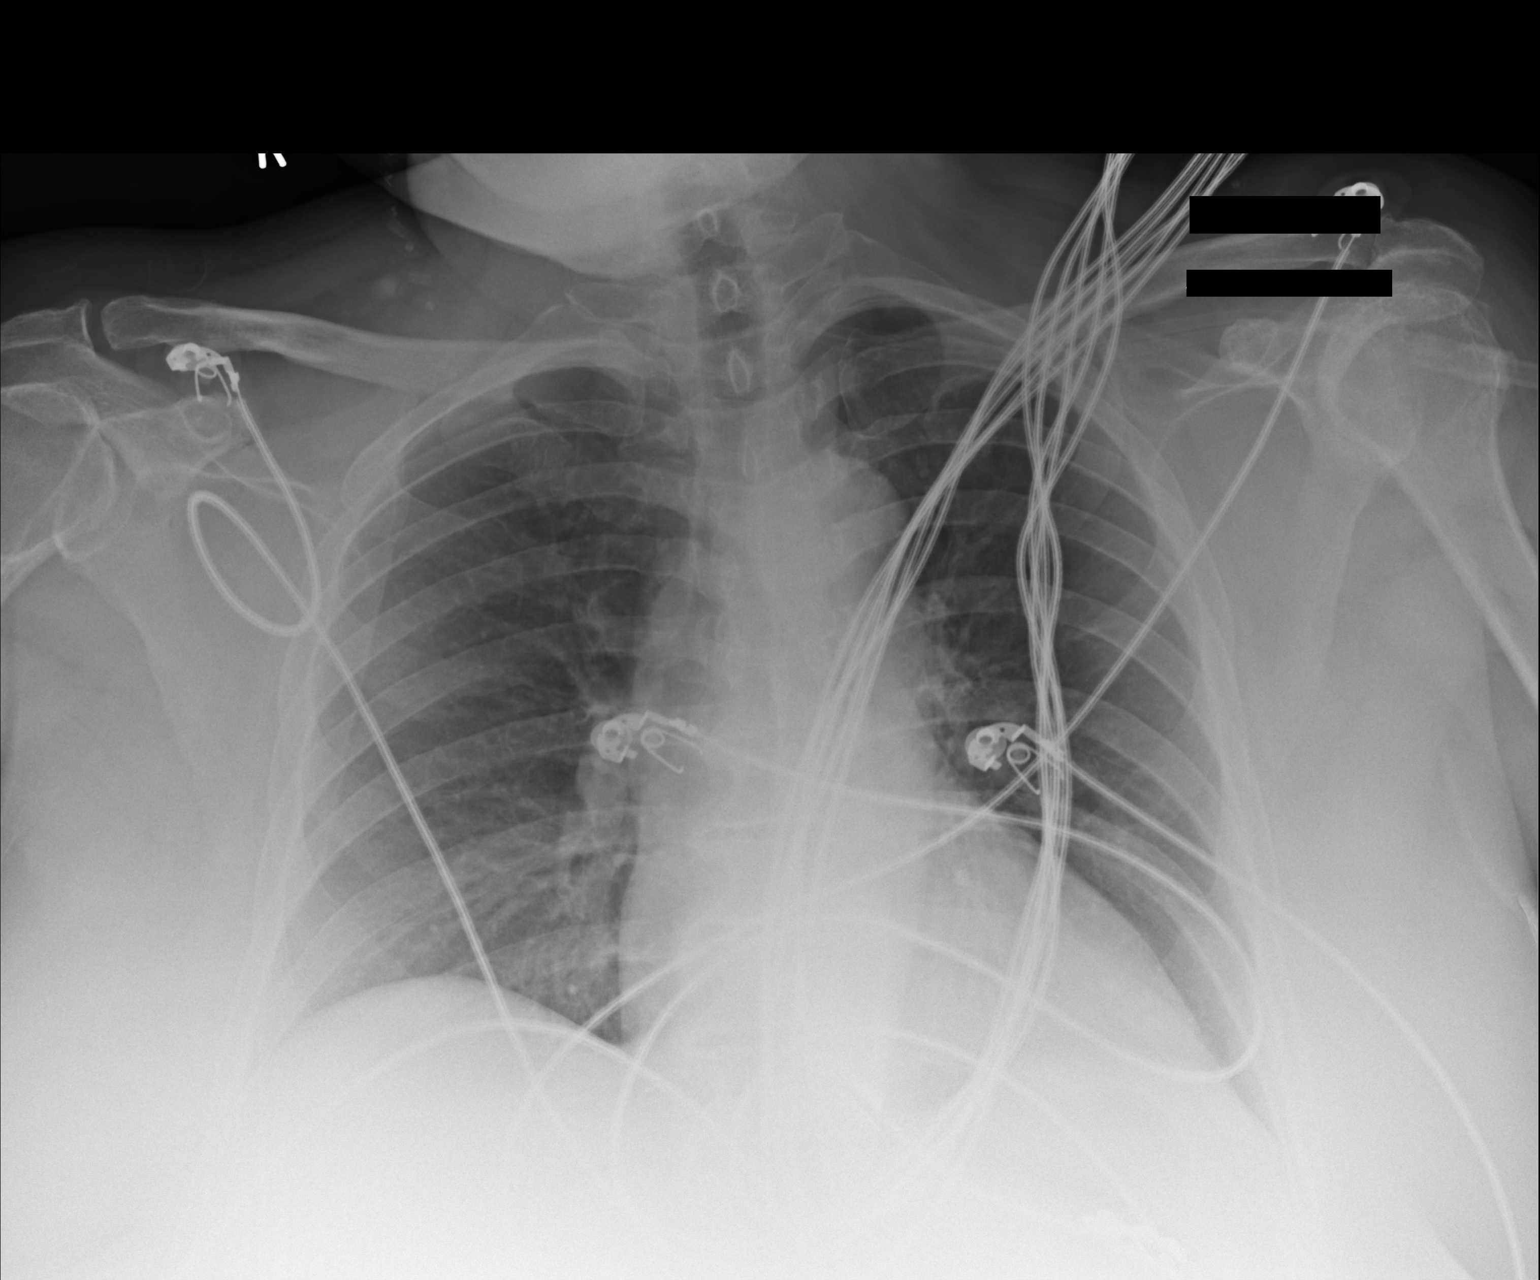

[1 of 1 positions shown; findings below may reference images not displayed]

FINDINGS: Normal heart size.  No pleural effusion or edema.  No
airspace consolidation identified.  Review of the visualized
osseous structures is unremarkable.
IMPRESSION: 1.  No acute cardiopulmonary abnormalities.

## 2014-01-29 ENCOUNTER — Non-Acute Institutional Stay (SKILLED_NURSING_FACILITY): Payer: Medicare Other | Admitting: Internal Medicine

## 2014-01-29 ENCOUNTER — Encounter: Payer: Self-pay | Admitting: Internal Medicine

## 2014-01-29 DIAGNOSIS — F259 Schizoaffective disorder, unspecified: Secondary | ICD-10-CM

## 2014-01-29 DIAGNOSIS — T50904A Poisoning by unspecified drugs, medicaments and biological substances, undetermined, initial encounter: Secondary | ICD-10-CM

## 2014-01-29 DIAGNOSIS — E038 Other specified hypothyroidism: Secondary | ICD-10-CM

## 2014-01-29 DIAGNOSIS — E032 Hypothyroidism due to medicaments and other exogenous substances: Secondary | ICD-10-CM

## 2014-01-29 DIAGNOSIS — R21 Rash and other nonspecific skin eruption: Secondary | ICD-10-CM

## 2014-01-29 DIAGNOSIS — Z5181 Encounter for therapeutic drug level monitoring: Secondary | ICD-10-CM

## 2014-01-29 NOTE — Progress Notes (Signed)
Patient ID: [name], female   DOB: July 24, 1950, 63 y.o.   MRN: [id number]  Location:    [facility] Provider:  Gwenith Spitziffany L. Renato Gailseed, D.O., C.M.D.  Code Status:  Full  Chief Complaint  Patient presents with  . Medical Management of Chronic Issues    HPI:  63 yo morbidly obese white female long term care resident was seen for med mgt of chronic diseases.  She had no new medical concerns.  She was psychotic as is typically the case stating that she was pregnant with triplets.    Review of Systems:  Review of Systems  Constitutional: Negative for fever.  HENT: Negative for hearing loss.   Eyes: Negative for blurred vision.  Respiratory: Negative for shortness of breath.   Cardiovascular: Negative for chest pain and leg swelling.  Gastrointestinal: Negative for abdominal pain and constipation.  Genitourinary: Negative for dysuria.  Musculoskeletal: Negative for back pain and falls.       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  Psychiatric/Behavioral: Negative for memory loss.       Delusional    Medications: Patient's Medications  New Prescriptions   No medications on file  Previous Medications   ALBUTEROL (PROVENTIL HFA;VENTOLIN HFA) 108 (90 BASE) MCG/ACT INHALER    Inhale 2 puffs into the lungs every 6 (six) hours as needed for wheezing.   ASPIRIN EC 81 MG TABLET    Take 81 mg by mouth every morning.    BISACODYL (DULCOLAX) 10 MG SUPPOSITORY    Place 10 mg rectally every 3 (three) days as needed for mild constipation or moderate constipation.   BUDESONIDE (PULMICORT) 0.25 MG/2ML NEBULIZER SOLUTION    Take 0.25 mg by nebulization 2 (two) times daily.   CHLORPROMAZINE (THORAZINE) 200 MG TABLET    Take 0.5 tablets (100 mg total) by mouth at bedtime.   DIVALPROEX (DEPAKOTE ER) 500 MG 24 HR TABLET    Take 1,000 mg by mouth at bedtime.   LAMOTRIGINE (LAMICTAL) 100 MG TABLET    Take 100 mg by mouth 2 (two) times daily.   LEVOTHYROXINE  (SYNTHROID, LEVOTHROID) 150 MCG TABLET    Take 150 mcg by mouth every morning. For thyroid disease.   LORAZEPAM (ATIVAN) 0.5 MG TABLET    Take one tablet by mouth three times daily as needed for anxiety   MAGNESIUM HYDROXIDE (MILK OF MAGNESIA) 400 MG/5ML SUSPENSION    Take 30 mLs by mouth daily as needed for mild constipation.   MULTIPLE VITAMIN (MULTIVITAMIN) TABLET    Take 1 tablet by mouth daily.   OMEPRAZOLE (PRILOSEC) 20 MG CAPSULE    Take 40 mg by mouth every morning.   Modified Medications   No medications on file  Discontinued Medications   CHLORPROMAZINE (THORAZINE) 50 MG TABLET    Take 1 tablet (50 mg total) by mouth 2 (two) times daily.    Physical Exam: Filed Vitals:   01/29/14 1433  BP: 106/56  Pulse: 88  Temp: 97.5 F (36.4 C)  Resp: 18  Height: 5\' 2"  (1.575 m)  Weight: 280 lb (127.007 kg)  SpO2: 95%  Physical Exam  Constitutional:  Morbidly obese white female in bed, chewing snuff  Cardiovascular: Normal rate, regular rhythm and normal heart sounds.   Pulmonary/Chest: Effort normal and breath sounds normal. No respiratory distress.  Abdominal: Soft. Bowel sounds are normal. She exhibits no distension and no mass. There is no tenderness.  Musculoskeletal:  ROM normal except contracture at knee  Neurological: She is alert.  Skin:  Still with a few open papules on her upper back  Psychiatric:  delusional   Labs reviewed: Basic Metabolic Panel:  Recent Labs  16/10/96 0411 11/07/13 0424 11/08/13 0800 11/25/13  NA 138 140 140 142  K 4.4 4.3 4.6 3.8  CL 103 105 102  --   CO2 25 26 27   --   GLUCOSE 62* 72 91  --   BUN 11 8 7 6   CREATININE 0.57 0.52 0.54 0.5  CALCIUM 10.4 10.8* 11.4*  --     Liver Function Tests:  Recent Labs  05/20/13 1840 11/05/13 1640 11/06/13 0411  AST 11 12 12   ALT 6 10 9   ALKPHOS 71 118* 103  BILITOT 0.2* 0.2* <0.2*  PROT 7.4 7.0 6.0  ALBUMIN 3.3* 3.4* 2.8*    CBC:  Recent Labs  02/21/13 1945  04/23/13 1600  04/24/13 0805  11/05/13 1640 11/06/13 0411 11/07/13 0424 12/05/13  WBC 11.6*  < > 6.5 6.6  < > 6.6 6.4 7.3 7.0  NEUTROABS 8.6*  --  3.4 3.6  --   --   --   --   --   HGB 13.2  < > 12.8 12.2  < > 12.7 11.8* 12.0 12.1  HCT 40.0  < > 38.0 36.1  < > 41.9 38.4 39.5 40  MCV 100.0  < > 98.7 100.0  < > 110.6* 110.7* 110.3*  --   PLT 275  < > 208 192  < > 214 184 183 131*  < > = values in this interval not displayed. Last lithium level 6/15 <0.15, BMP nl, cbc nl, 11/05/13 tsh nl  Assessment/Plan 1. Schizoaffective disorder, unspecified type -primary reason for her placement -resident was unable to perform self-care and was having repeated admissions with copd exacerbations and psychotic episodes due to medication noncompliance--she also was unable to safely ambulate due to her weight and contracture of her leg -continues on thorazine, depakote, lamictal at present  2. Rash of back -has improved -treatment nurse is managing this--seems to be related to heat on her mattress  3. Morbid obesity -unchanged, stays in bed  4. Hypothyroidism due to medication -cont synthroid156mcg q am, last tsh wnl  5.  Med monitoring -check EKG, cbc, bmp due to thorazine use -also continue to monitor depakote levels and tsh at future routine visits  Family/ staff Communication: discussed with her nurse Goals of care: full code, has guardian, is long term care resident  Labs/tests ordered:  EKG, cbc, bmp due to thorazine use

## 2014-01-30 LAB — CBC AND DIFFERENTIAL
HCT: 39 % (ref 36–46)
Hemoglobin: 12.6 g/dL (ref 12.0–16.0)
Platelets: 185 10*3/uL (ref 150–399)
WBC: 5.7 10*3/mL

## 2014-01-30 LAB — BASIC METABOLIC PANEL
BUN: 9 mg/dL (ref 4–21)
CREATININE: 0.5 mg/dL (ref 0.5–1.1)
GLUCOSE: 84 mg/dL
POTASSIUM: 3.8 mmol/L (ref 3.4–5.3)
Sodium: 141 mmol/L (ref 137–147)

## 2014-04-23 ENCOUNTER — Non-Acute Institutional Stay (SKILLED_NURSING_FACILITY): Payer: Medicare Other | Admitting: Internal Medicine

## 2014-04-23 ENCOUNTER — Encounter: Payer: Self-pay | Admitting: Internal Medicine

## 2014-04-23 DIAGNOSIS — E039 Hypothyroidism, unspecified: Secondary | ICD-10-CM

## 2014-04-23 DIAGNOSIS — Z720 Tobacco use: Secondary | ICD-10-CM

## 2014-04-23 DIAGNOSIS — K219 Gastro-esophageal reflux disease without esophagitis: Secondary | ICD-10-CM

## 2014-04-23 DIAGNOSIS — F259 Schizoaffective disorder, unspecified: Secondary | ICD-10-CM

## 2014-04-23 NOTE — Progress Notes (Signed)
Patient ID: [name], female   DOB: 1950/08/29, 63 y.o.   MRN: [id number]   Place of Service: [name]  Allergies  Allergen Reactions  . Food Rash    "lasagna"= rash from the noodles  . Penicillins Hives  . Sulfamethoxazole-Trimethoprim Hives and Nausea And Vomiting    Code Status: Full Code  Goals of Care: Longevity/Long term care  Chief Complaint  Patient presents with  . Medical Management of Chronic Issues    schizo-affective psychosis, GERD, hypothyroidism    HPI 63 y.o. female with PMH of schizo-affective psychosis, hypothyroidism, GERD, obesity among others is being seen for a routine visit. Weight stable. No falls or skin concerns reported. No change in functional status or behaviors. No concerns from staff.   Review of Systems Constitutional: Negative for fever, chills, and fatigue. HENT: Negative for ear pain, congestion, and sore throat Eyes: Negative for eye discharge and visual disturbance  Cardiovascular: Negative for chest pain, palpitations, and leg swelling Respiratory: Negative cough, shortness of breath, and wheezing.  Gastrointestinal: Negative for nausea and vomiting. Negative for abdominal pain, diarrhea and constipation.  Endocrine: Negative for polydipsia, polyphagia, and polyuria Musculoskeletal: Negative for back pain, joint pain, and joint swelling  Neurological: Negative for dizziness and headache Skin: Negative for rash and wound.   Psychiatric: Negative for agitation   Past Medical History  Diagnosis Date  . Schizoaffective disorder   . Urinary tract infection   . Hypertension   . GERD (gastroesophageal reflux disease)   . Hypothyroidism   . Obesity   . Asthma   . COPD (chronic obstructive pulmonary disease)   . CHF (congestive heart failure)   . Seizures   . Morbid obesity   . Chronic pain   . Hypercholesteremia   . Anginal pain   . Myocardial infarction 2002  . Diabetes mellitus     "I'm not diabetic  anymore" (05/23/2012)  . Anemia   . History of blood transfusion 1993    "when I had my hysterectomy" (05/23/2012)  . H/O hiatal hernia   . Daily headache   . Arthritis     "severe; all over my body" (05/23/2012)  . Hypoventilation syndrome     [name] 05/30/2012  . Atrial fibrillation   . Atrial flutter, paroxysmal     [name] 05/30/2012  . Exertional dyspnea   . Shortness of breath     "all the time lately" (05/30/2012)  . Pneumonia     "several times; including now" (04/23/2013)  . Anxiety   . Episodic mood disorder   . Psychosis   . Schizophrenia     Past Surgical History  Procedure Laterality Date  . Tubal ligation  1998  . Cholecystectomy  ?2008  . Cardiac catheterization  2008  . Vaginal hysterectomy  1993    History   Social History  . Marital Status: Widowed    Spouse Name: N/A    Number of Children: N/A  . Years of Education: N/A   Occupational History  . Not on file.   Social History Main Topics  . Smoking status: Former Smoker -- 2.00 packs/day for 12 years    Types: Cigarettes    Quit date: 06/29/2000  . Smokeless tobacco: Current User    Types: Chew  . Alcohol Use: No  . Drug Use: No  . Sexual Activity: Yes    Birth Control/ Protection: Post-menopausal   Other Topics Concern  . Not on file   Social History Narrative  Medication List       This list is accurate as of: 04/23/14  2:41 PM.  Always use your most recent med list.               albuterol 108 (90 BASE) MCG/ACT inhaler  Commonly known as:  PROVENTIL HFA;VENTOLIN HFA  Inhale 2 puffs into the lungs every 6 (six) hours as needed for wheezing.     aspirin EC 81 MG tablet  Take 81 mg by mouth every morning.     bisacodyl 10 MG suppository  Commonly known as:  DULCOLAX  Place 10 mg rectally every 3 (three) days as needed for mild constipation or moderate constipation.     budesonide 0.25 MG/2ML nebulizer solution  Commonly known as:  PULMICORT  Take 0.25 mg by  nebulization 2 (two) times daily.     chlorproMAZINE 200 MG tablet  Commonly known as:  THORAZINE  Take 0.5 tablets (100 mg total) by mouth at bedtime.     divalproex 500 MG 24 hr tablet  Commonly known as:  DEPAKOTE ER  Take 1,000 mg by mouth at bedtime.     lamoTRIgine 100 MG tablet  Commonly known as:  LAMICTAL  Take 100 mg by mouth 2 (two) times daily.     levothyroxine 150 MCG tablet  Commonly known as:  SYNTHROID, LEVOTHROID  Take 150 mcg by mouth every morning. For thyroid disease.     LORazepam 0.5 MG tablet  Commonly known as:  ATIVAN  Take one tablet by mouth three times daily as needed for anxiety     MILK OF MAGNESIA 400 MG/5ML suspension  Generic drug:  magnesium hydroxide  Take 30 mLs by mouth daily as needed for mild constipation.     multivitamin tablet  Take 1 tablet by mouth daily.     omeprazole 20 MG capsule  Commonly known as:  PRILOSEC  Take 40 mg by mouth every morning.        Physical Exam Filed Vitals:   04/23/14 1431  BP: 126/74  Pulse: 86  Temp: 97.8 F (36.6 C)  Resp: 18   Constitutional: WDWN adult female in no acute distress.  HEENT: Normocephalic and atraumatic. PERRL. EOM intact. No icterus. No nasal discharge or sinus tenderness. Oral mucosa moist with tobacco present in mouth.  Neck: Supple and nontender. No JVD or carotid bruits. Cardiac: Normal S1, S2. RRR without appreciable murmurs, rubs, or gallops. Distal pulses intact. No leg edema.  Lungs: No respiratory distress. Breath sounds clear bilaterally without rales, rhonchi, or wheezes. Abdomen: Audible bowel sounds in all quadrants. Soft, nontender, nondistended. No palpable mass.  Musculoskeletal: Able to move all extremities. Left sided hemiplegia. Non pitting edema noted on left arm. Skin: Warm and dry. No rash noted. No erythema.  Neurological: Alert and oriented to person, place, and time.  Psychiatric: Judgment and insight inadequate.  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  Labs Reviewed CBC Latest Ref Rng 01/30/2014 12/05/2013 11/07/2013  WBC - 5.7 7.0 7.3  Hemoglobin 12.0 - 16.0 g/dL 16.112.6 09.612.1 04.512.0  Hematocrit 36 - 46 % 39 40 39.5  Platelets 150 - 399 K/L 185 131(A) 183    CMP     Component Value Date/Time   NA 141 01/30/2014   NA 140 11/08/2013 0800   K 3.8 01/30/2014   CL 102 11/08/2013 0800   CO2 27 11/08/2013 0800   GLUCOSE 91 11/08/2013 0800   BUN 9  01/30/2014   BUN 7 11/08/2013 0800   CREATININE 0.5 01/30/2014   CREATININE 0.54 11/08/2013 0800   CALCIUM 11.4* 11/08/2013 0800   PROT 6.0 11/06/2013 0411   ALBUMIN 2.8* 11/06/2013 0411   AST 12 11/06/2013 0411   ALT 9 11/06/2013 0411   ALKPHOS 103 11/06/2013 0411   BILITOT <0.2* 11/06/2013 0411   GFRNONAA >90 11/08/2013 0800   GFRAA >90 11/08/2013 0800    Lab Results  Component Value Date   TSH 2.140 11/05/2013    Assessment & Plan 1. Schizoaffective disorder, unspecified type Stable. Continue to f/u with psy. Continue depakote1000mg  daily, lamictal 100mg  twice daily, chlorpromazine 100mg  daily at bedtime and 50mg  twice daily, and ativan 0.5mg  TID PRN for anxiety. Will check depakote drug level. Continue to monitor for change in behaviors.   2. Hypothyroidism, unspecified hypothyroidism type Stable. Last tsh was normal. Continue levothyroxine daily and monitor.   3. Gastroesophageal reflux disease without esophagitis Stable continue omeprazole 40mg  daily and monitor.    4. Tobacco Abuse Encourage cessation. Resident refuses at this time. Continue to monitor.   Labs Ordered: Depakote level   Family/Staff Communication Plan of care discuss with resident and professional staff members. Resident and professional staff members verbalize understanding and agree with plan of care. No additional questions or concerns reported.    Loura Back, MSN, AGNP-C [facility] [address] [place], [place] [address] [phone]  [8am-5pm] After hours: [phone]

## 2014-05-27 ENCOUNTER — Encounter: Payer: Self-pay | Admitting: Adult Health

## 2014-05-27 ENCOUNTER — Non-Acute Institutional Stay (SKILLED_NURSING_FACILITY): Payer: Medicare Other | Admitting: Adult Health

## 2014-05-27 DIAGNOSIS — F259 Schizoaffective disorder, unspecified: Secondary | ICD-10-CM

## 2014-05-27 DIAGNOSIS — I482 Chronic atrial fibrillation, unspecified: Secondary | ICD-10-CM

## 2014-05-27 DIAGNOSIS — J449 Chronic obstructive pulmonary disease, unspecified: Secondary | ICD-10-CM

## 2014-05-27 DIAGNOSIS — K219 Gastro-esophageal reflux disease without esophagitis: Secondary | ICD-10-CM

## 2014-05-27 DIAGNOSIS — E039 Hypothyroidism, unspecified: Secondary | ICD-10-CM

## 2014-05-27 NOTE — Progress Notes (Signed)
Patient ID: [name], female   DOB: [birth year], 63 y.o.   MRN: [id number]  [facility]     Allergies  Allergen Reactions  . Food Rash    "lasagna"= rash from the noodles  . Penicillins Hives  . Sulfamethoxazole-Trimethoprim Hives and Nausea And Vomiting       Chief Complaint  Patient presents with  . Medical Management of Chronic Issues    HPI:  She is a long term resident of this facility being seen for the management of her chronic illnesses. Overall her status has had little change. She is unable to fully participate in the hpi or ros. There are no nursing concerns being voiced at this time.    Past Medical History  Diagnosis Date  . Schizoaffective disorder   . Urinary tract infection   . Hypertension   . GERD (gastroesophageal reflux disease)   . Hypothyroidism   . Obesity   . Asthma   . COPD (chronic obstructive pulmonary disease)   . CHF (congestive heart failure)   . Seizures   . Morbid obesity   . Chronic pain   . Hypercholesteremia   . Anginal pain   . Myocardial infarction 2002  . Diabetes mellitus     "I'm not diabetic anymore" (05/23/2012)  . Anemia   . History of blood transfusion 1993    "when I had my hysterectomy" (05/23/2012)  . H/O hiatal hernia   . Daily headache   . Arthritis     "severe; all over my body" (05/23/2012)  . Hypoventilation syndrome     [name]/notes 05/30/2012  . Atrial fibrillation   . Atrial flutter, paroxysmal     [name]/notes 05/30/2012  . Exertional dyspnea   . Shortness of breath     "all the time lately" (05/30/2012)  . Pneumonia     "several times; including now" (04/23/2013)  . Anxiety   . Episodic mood disorder   . Psychosis   . Schizophrenia     Past Surgical History  Procedure Laterality Date  . Tubal ligation  1998  . Cholecystectomy  ?2008  . Cardiac catheterization  2008  . Vaginal hysterectomy  1993    VITAL SIGNS BP 130/90 mmHg  Pulse 84  Ht 5\' 2"  (1.575 m)  Wt 288 lb (130.636 kg)  BMI 52.66  kg/m2  SpO2 95%   Outpatient Encounter Prescriptions as of 05/27/2014  Medication Sig  . aspirin EC 81 MG tablet Take 81 mg by mouth every morning.   . bisacodyl (DULCOLAX) 10 MG suppository Place 10 mg rectally every 3 (three) days as needed for mild constipation or moderate constipation.  . budesonide (PULMICORT) 0.25 MG/2ML nebulizer solution Take 0.25 mg by nebulization 2 (two) times daily.  . chlorproMAZINE (THORAZINE) 200 MG tablet Take 0.5 tablets (100 mg total) by mouth at bedtime. (Patient taking differently: Take 100 mg by mouth. 100mg  daily at bedtime and 50mg  BID)  . divalproex (DEPAKOTE ER) 500 MG 24 hr tablet Take 1,250 mg by mouth at bedtime.   . lamoTRIgine (LAMICTAL) 100 MG tablet Take 100 mg by mouth 2 (two) times daily.  Marland Kitchen. levothyroxine (SYNTHROID, LEVOTHROID) 150 MCG tablet Take 150 mcg by mouth every morning. For thyroid disease.  Marland Kitchen. LORazepam (ATIVAN) 0.5 MG tablet Take one tablet by mouth three times daily as needed for anxiety  . magnesium hydroxide (MILK OF MAGNESIA) 400 MG/5ML suspension Take 30 mLs by mouth daily as needed for mild constipation.  . Multiple Vitamin (MULTIVITAMIN) tablet Take 1  tablet by mouth daily.  Marland Kitchen omeprazole (PRILOSEC) 20 MG capsule Take 40 mg by mouth every morning.   . [DISCONTINUED] albuterol (PROVENTIL HFA;VENTOLIN HFA) 108 (90 BASE) MCG/ACT inhaler Inhale 2 puffs into the lungs every 6 (six) hours as needed for wheezing.     SIGNIFICANT DIAGNOSTIC EXAMS  05-10-14: chest x-ray: modest cardiomegaly with clear lungs   LABS REVIEWED:   04-22-14: wbc 6.2; hgb 12.6; hct 38.3; mcv 103.1; plt 216 glucose 78; bun 18.5; creat 0.61; k+4.4; na++137; liver normal albumin 3.4; tsh 3.89  depakote 57  04-29-14: vit b12: 424; folic 15.0   Review of Systems  Unable to perform ROS    Physical Exam  Constitutional: No distress.  Morbid obesity   Neck: Neck supple. No JVD present.  Cardiovascular: Normal rate, regular rhythm and intact distal  pulses.   Respiratory: Effort normal and breath sounds normal. No respiratory distress. She has no wheezes.  GI: Soft. Bowel sounds are normal. She exhibits no distension. There is no tenderness.  Musculoskeletal: She exhibits no edema.  Is able to move extremities   Neurological: She is alert.  Skin: Skin is warm and dry. She is not diaphoretic.       ASSESSMENT/ PLAN:  1.  Hypothyroidism: is stable her tsh is 3.89; will continue synthroid 150 mcg daily   2. Afib: heart rate is stable will continue asa 81 mg daily  Will monitor her status.   3. COPD: is stable will continue pulmicort neb treatment twice daily   4. Gerd: will continue prilosec 20 mg daily   5. Schizophrenia: she is without change in status; she does talk to herself; will continue thorazine 50 mg twice daily and 100 mg nightly; depakote 1250 mg daily; and lamictal 100 mg twice daily to stabilize mood. Will continue ativan 0.5 mg three times daily as needed for anxiety and will monitor     Synthia Innocent NP [facility]  Contact [phone] Monday through Friday 8am- 5pm  After hours call [phone]

## 2014-06-12 IMAGING — CR DG CHEST 2V
1 series · 1 of 1 positions shown · non-contrast
Comparison: 09/23/2012

CLINICAL DATA: Generalized body aches

CHEST - 2 VIEW

[view not recorded]
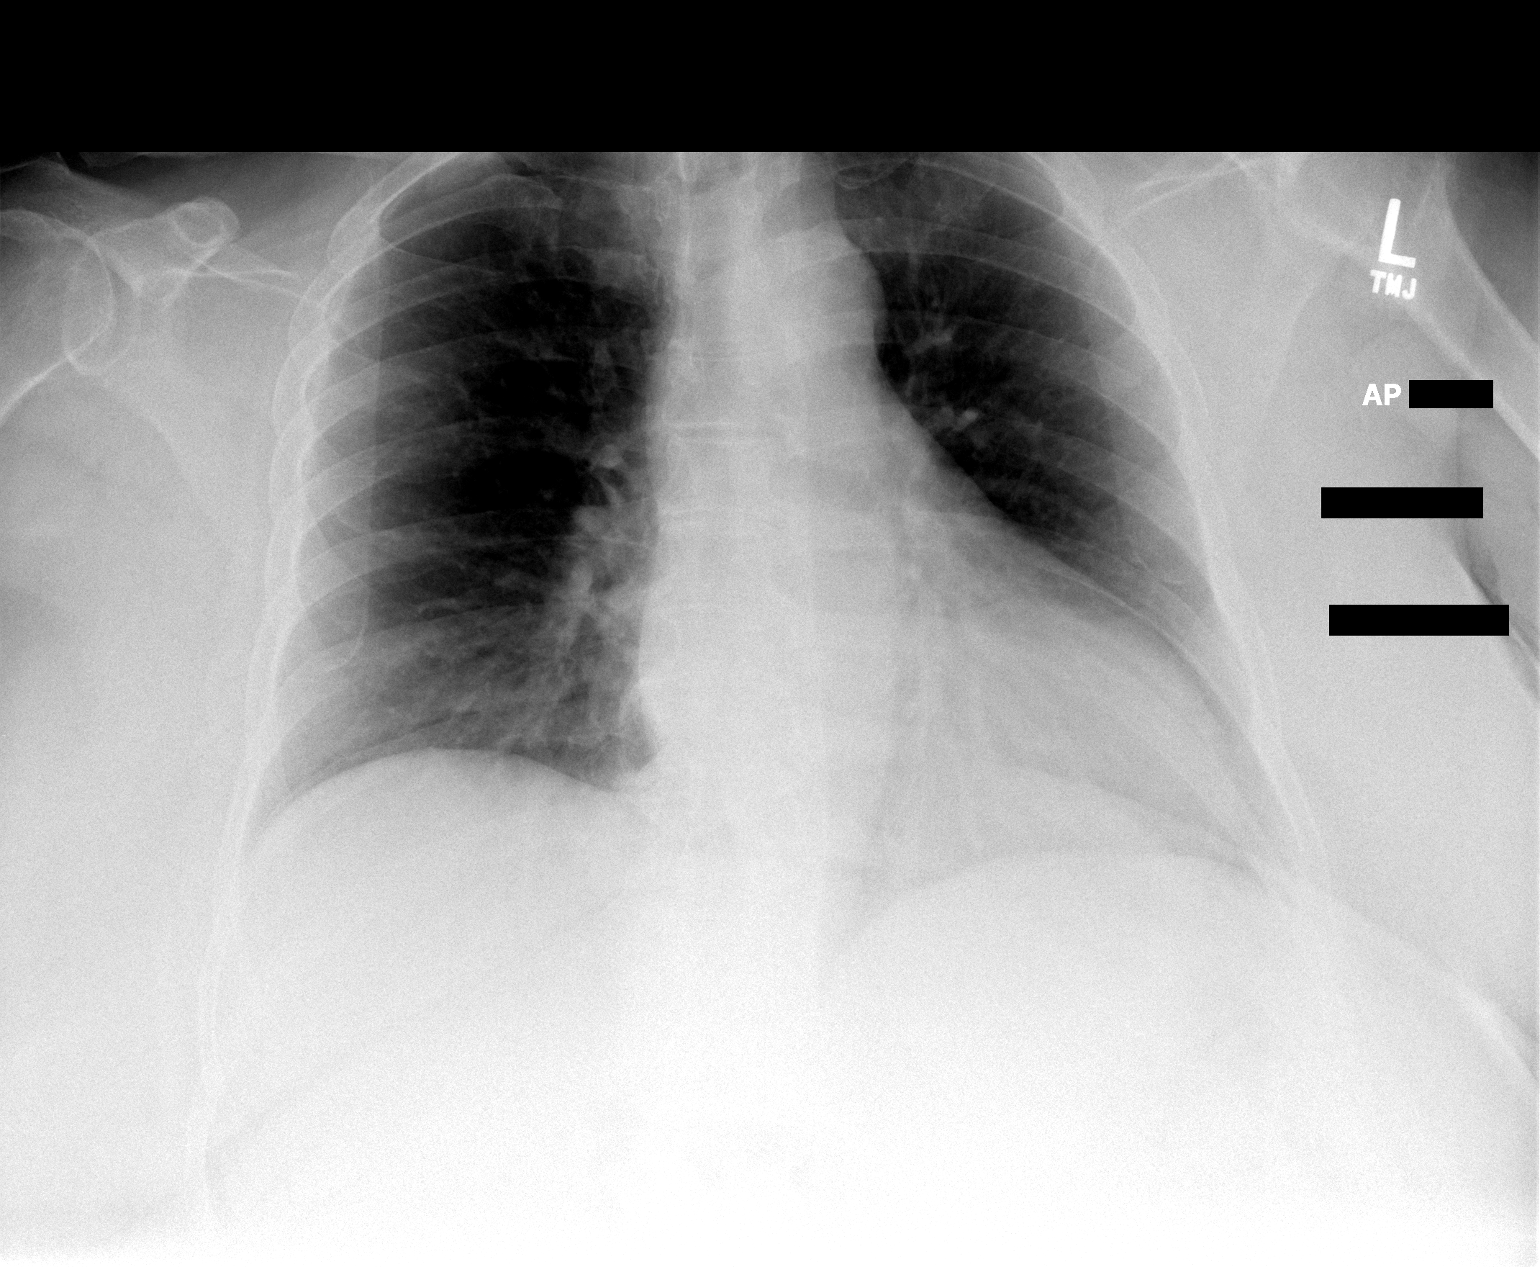

[1 of 1 positions shown; findings below may reference images not displayed]

FINDINGS: Cardiac enlargement without heart failure.  Lungs are
clear without infiltrate effusion or mass lesion.  No change from
the prior study.
IMPRESSION: Cardiac enlargement.  No acute abnormality.

## 2014-06-21 IMAGING — CR DG CHEST 1V PORT
1 series · 1 of 1 positions shown · non-contrast
Comparison: Prior chest x-ray 02/12/2013

CLINICAL DATA: Altered mental status history of COPD and CHF

PORTABLE CHEST - 1 VIEW

[AP]
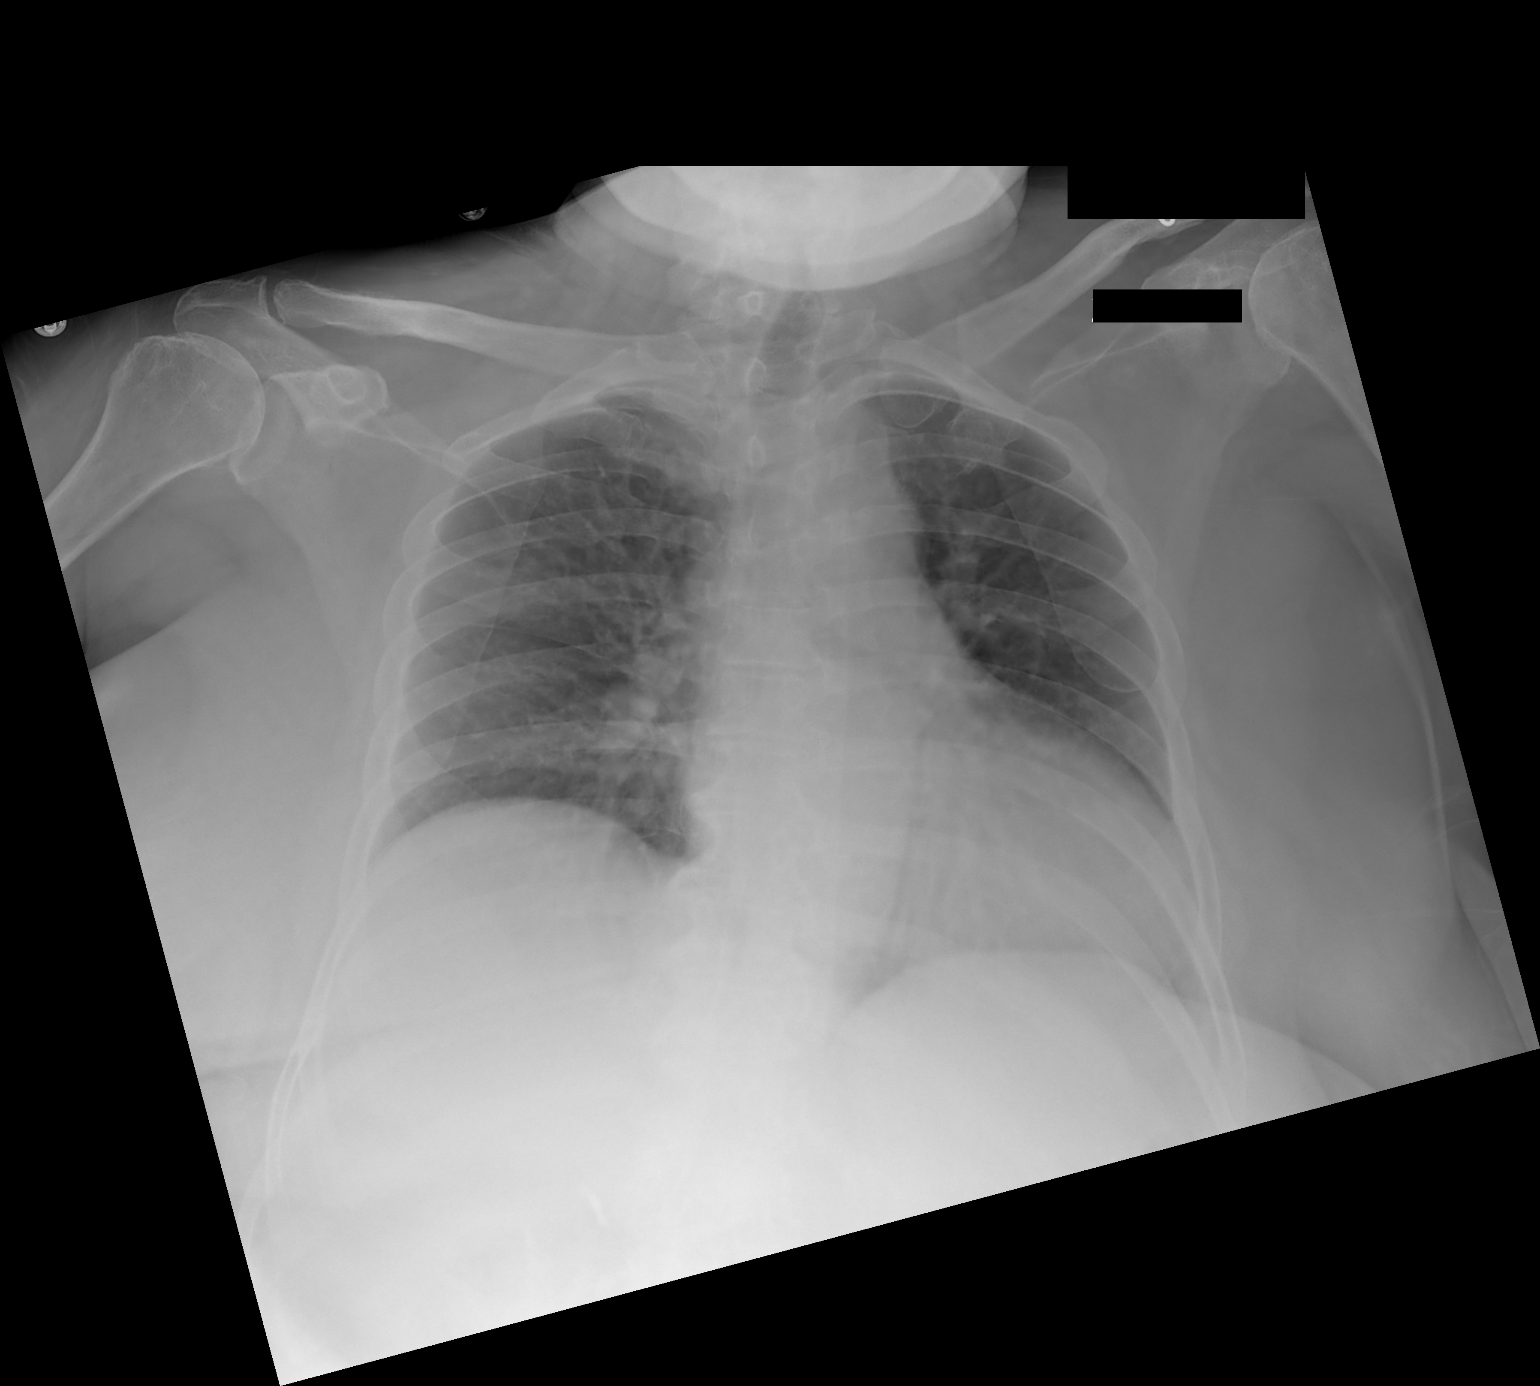

[1 of 1 positions shown; findings below may reference images not displayed]

FINDINGS: Low inspiratory volumes with mild bibasilar atelectasis.
Stable enlargement the cardiopericardial silhouette with the
ventricular prominence.  Pulmonary vascular congestion is
exaggerated by the low lung volumes.  No overt edema, focal
consolidation, pneumothorax or pleural effusion.  No acute osseous
abnormality.
IMPRESSION: Low lung volumes with mild bibasilar atelectasis.  Otherwise, no
acute cardiopulmonary disease.

Stable mild cardiomegaly with left jugular prominence.

## 2014-06-24 ENCOUNTER — Other Ambulatory Visit: Payer: Self-pay | Admitting: *Deleted

## 2014-06-24 MED ORDER — LORAZEPAM 0.5 MG PO TABS: ORAL_TABLET | ORAL | Status: DC

## 2014-06-24 NOTE — Telephone Encounter (Signed)
[facility] 

## 2014-07-02 ENCOUNTER — Non-Acute Institutional Stay (SKILLED_NURSING_FACILITY): Payer: Medicare Other | Admitting: Adult Health

## 2014-07-02 DIAGNOSIS — E039 Hypothyroidism, unspecified: Secondary | ICD-10-CM

## 2014-07-02 DIAGNOSIS — K219 Gastro-esophageal reflux disease without esophagitis: Secondary | ICD-10-CM

## 2014-07-02 DIAGNOSIS — F250 Schizoaffective disorder, bipolar type: Secondary | ICD-10-CM

## 2014-07-02 DIAGNOSIS — I482 Chronic atrial fibrillation, unspecified: Secondary | ICD-10-CM

## 2014-07-02 DIAGNOSIS — K5901 Slow transit constipation: Secondary | ICD-10-CM

## 2014-07-02 DIAGNOSIS — J449 Chronic obstructive pulmonary disease, unspecified: Secondary | ICD-10-CM

## 2014-07-02 DIAGNOSIS — E119 Type 2 diabetes mellitus without complications: Secondary | ICD-10-CM

## 2014-07-22 ENCOUNTER — Encounter: Payer: Self-pay | Admitting: Adult Health

## 2014-07-22 NOTE — Progress Notes (Signed)
Patient ID: [name], female   DOB: 1950-07-16, 64 y.o.   MRN: [id number]  [facility]     Allergies  Allergen Reactions  . Food Rash    "lasagna"= rash from the noodles  . Penicillins Hives  . Sulfamethoxazole-Trimethoprim Hives and Nausea And Vomiting       Chief Complaint  Patient presents with  . Annual Exam    HPI:  She is a long term resident of this facility being seen for her annual review. She was hospitalized one time this past year for lithium toxicity. She has done well since that time. She is unable to fully participate in the hpi or ros. She is not a candidate for a mammogram or a colonoscopy. There are no nursing concerns being voiced at this time.    Past Medical History  Diagnosis Date  . Schizoaffective disorder   . Urinary tract infection   . Hypertension   . GERD (gastroesophageal reflux disease)   . Hypothyroidism   . Obesity   . Asthma   . COPD (chronic obstructive pulmonary disease)   . CHF (congestive heart failure)   . Seizures   . Morbid obesity   . Chronic pain   . Hypercholesteremia   . Anginal pain   . Myocardial infarction 2002  . Diabetes mellitus     "I'm not diabetic anymore" (05/23/2012)  . Anemia   . History of blood transfusion 1993    "when I had my hysterectomy" (05/23/2012)  . H/O hiatal hernia   . Daily headache   . Arthritis     "severe; all over my body" (05/23/2012)  . Hypoventilation syndrome     [name] 05/30/2012  . Atrial fibrillation   . Atrial flutter, paroxysmal     [name] 05/30/2012  . Exertional dyspnea   . Shortness of breath     "all the time lately" (05/30/2012)  . Pneumonia     "several times; including now" (04/23/2013)  . Anxiety   . Episodic mood disorder   . Psychosis   . Schizophrenia   . 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 CHOLELITHIASIS 11/30/2009    Qualifier: Diagnosis of  By: Brynda Rim      Past Surgical History    Procedure Laterality Date  . Tubal ligation  1998  . Cholecystectomy  ?2008  . Cardiac catheterization  2008  . Vaginal hysterectomy  1993    History   Social History  . Marital Status: Widowed    Spouse Name: N/A    Number of Children: N/A  . Years of Education: N/A   Occupational History  . Not on file.   Social History Main Topics  . Smoking status: Former Smoker -- 2.00 packs/day for 12 years    Types: Cigarettes    Quit date: 06/29/2000  . Smokeless tobacco: Current User    Types: Chew  . Alcohol Use: No  . Drug Use: No  . Sexual Activity: Yes    Birth Control/ Protection: Post-menopausal   Other Topics Concern  . Not on file   Social History Narrative    Family History  Problem Relation Age of Onset  . Heart disease Father   . Cancer Mother 48    Breast     VITAL SIGNS BP 126/74 mmHg  Pulse 88  Ht 5\' 2"  (1.575 m)  Wt 305 lb (138.347 kg)  BMI 55.77 kg/m2  SpO2 95%  Outpatient Encounter Prescriptions as of 07/02/2014  Medication Sig  . aspirin EC 81 MG tablet Take 81 mg by mouth every morning.   . bisacodyl (DULCOLAX) 10 MG suppository Place 10 mg rectally every 3 (three) days as needed for mild constipation or moderate constipation.  . budesonide (PULMICORT) 0.25 MG/2ML nebulizer solution Take 0.25 mg by nebulization 2 (two) times daily.  . chlorproMAZINE (THORAZINE) 200 MG tablet Take 0.5 tablets (100 mg total) by mouth at bedtime. (Patient taking differently: Take 100 mg by mouth.  daily at bedtime and  BID)  . divalproex (DEPAKOTE ER) 500 MG 24 hr tablet Take 1,250 mg by mouth at bedtime.   . lamoTRIgine (LAMICTAL) 100 MG tablet Take 100 mg by mouth 2 (two) times daily.  Marland Kitchen levothyroxine (SYNTHROID, LEVOTHROID) 150 MCG tablet Take 150 mcg by mouth every morning. For thyroid disease.  Marland Kitchen LORazepam (ATIVAN) 0.5 MG tablet Take one tablet by mouth three times daily as needed for anxiety  . magnesium hydroxide (MILK OF MAGNESIA) 400 MG/5ML  suspension Take 30 mLs by mouth daily as needed for mild constipation.  . Multiple Vitamin (MULTIVITAMIN) tablet Take 1 tablet by mouth daily.  Marland Kitchen omeprazole (PRILOSEC) 20 MG capsule Take 40 mg by mouth every morning.      SIGNIFICANT DIAGNOSTIC EXAMS  She is not a candidate for a mammogram or colonoscopy    05-10-14: chest x-ray: modest cardiomegaly with clear lungs   LABS REVIEWED:   04-22-14: wbc 6.2; hgb 12.6; hct 38.3; mcv 103.1; plt 216 glucose 78; bun 18.5; creat 0.61; k+4.4; na++137; liver normal albumin 3.4; tsh 3.89  depakote 57  04-29-14: vit b12: 424; folic 15.0   Review of Systems  Unable to perform ROS    Physical Exam Constitutional: No distress.  Morbid obesity   Neck: Neck supple. No JVD present.  Cardiovascular: Normal rate, regular rhythm and intact distal pulses.   Respiratory: Effort normal and breath sounds normal. No respiratory distress. She has no wheezes.  GI: Soft. Bowel sounds are normal. She exhibits no distension. There is no tenderness.  Musculoskeletal: She exhibits no edema.  Is able to move extremities   Neurological: She is alert.  Skin: Skin is warm and dry. She is not diaphoretic.    ASSESSMENT/ PLAN:  1.  Hypothyroidism: is stable her tsh is 3.89; will continue synthroid 150 mcg daily   2. Afib: heart rate is stable will continue asa 81 mg daily  Will monitor her status.   3. COPD: is stable will continue pulmicort neb treatment twice daily   4. Gerd: will continue prilosec 40 mg daily   5. Schizophrenia: she is without change in status; she does talk to herself; will continue thorazine 50 mg twice daily and 100 mg nightly; depakote 1250 mg daily; and lamictal 100 mg twice daily to stabilize mood. Will continue ativan 0.5 mg three times daily as needed for anxiety and will monitor  Is followed by psych services   6. Diabetes: she is presently not on medications; will not make changes will monitor her status.    Will setup a  portable dexa scan; will guaiac stools X3 and will check hgb a1c    Synthia Innocent NP [facility]  Contact [phone] Monday through Friday 8am- 5pm  After hours call [phone]

## 2014-08-07 ENCOUNTER — Encounter (HOSPITAL_COMMUNITY): Payer: Self-pay | Admitting: *Deleted

## 2014-08-07 ENCOUNTER — Emergency Department (HOSPITAL_COMMUNITY)
Admission: EM | Admit: 2014-08-07 | Discharge: 2014-08-07 | Disposition: A | Discharge status: Home / Self Care | Payer: Medicare Other | Attending: Emergency Medicine | Admitting: Emergency Medicine

## 2014-08-07 DIAGNOSIS — F22 Delusional disorders: Secondary | ICD-10-CM | POA: Diagnosis not present

## 2014-08-07 DIAGNOSIS — Z7982 Long term (current) use of aspirin: Secondary | ICD-10-CM | POA: Insufficient documentation

## 2014-08-07 DIAGNOSIS — I10 Essential (primary) hypertension: Secondary | ICD-10-CM | POA: Insufficient documentation

## 2014-08-07 DIAGNOSIS — J449 Chronic obstructive pulmonary disease, unspecified: Secondary | ICD-10-CM | POA: Diagnosis not present

## 2014-08-07 DIAGNOSIS — E079 Disorder of thyroid, unspecified: Secondary | ICD-10-CM | POA: Diagnosis not present

## 2014-08-07 DIAGNOSIS — Z79899 Other long term (current) drug therapy: Secondary | ICD-10-CM | POA: Diagnosis not present

## 2014-08-07 DIAGNOSIS — F319 Bipolar disorder, unspecified: Secondary | ICD-10-CM | POA: Insufficient documentation

## 2014-08-07 DIAGNOSIS — G40909 Epilepsy, unspecified, not intractable, without status epilepticus: Secondary | ICD-10-CM | POA: Insufficient documentation

## 2014-08-07 DIAGNOSIS — F250 Schizoaffective disorder, bipolar type: Secondary | ICD-10-CM | POA: Diagnosis not present

## 2014-08-07 DIAGNOSIS — K219 Gastro-esophageal reflux disease without esophagitis: Secondary | ICD-10-CM | POA: Insufficient documentation

## 2014-08-07 DIAGNOSIS — Z87891 Personal history of nicotine dependence: Secondary | ICD-10-CM | POA: Diagnosis not present

## 2014-08-07 DIAGNOSIS — I4892 Unspecified atrial flutter: Secondary | ICD-10-CM | POA: Diagnosis not present

## 2014-08-07 DIAGNOSIS — I509 Heart failure, unspecified: Secondary | ICD-10-CM | POA: Diagnosis not present

## 2014-08-07 DIAGNOSIS — F419 Anxiety disorder, unspecified: Secondary | ICD-10-CM | POA: Diagnosis not present

## 2014-08-07 DIAGNOSIS — Z880 Allergy status to penicillin: Secondary | ICD-10-CM | POA: Diagnosis not present

## 2014-08-07 DIAGNOSIS — Z008 Encounter for other general examination: Secondary | ICD-10-CM | POA: Diagnosis present

## 2014-08-07 DIAGNOSIS — Z7951 Long term (current) use of inhaled steroids: Secondary | ICD-10-CM | POA: Insufficient documentation

## 2014-08-07 HISTORY — DX: Unspecified mood (affective) disorder: F39

## 2014-08-07 HISTORY — DX: Unspecified atrial flutter: I48.92

## 2014-08-07 HISTORY — DX: Bipolar disorder, unspecified: F31.9

## 2014-08-07 HISTORY — DX: Disorder of thyroid, unspecified: E07.9

## 2014-08-07 HISTORY — DX: Epilepsy, unspecified, not intractable, without status epilepticus: G40.909

## 2014-08-07 HISTORY — DX: Schizoaffective disorder, unspecified: F25.9

## 2014-08-07 HISTORY — DX: Diaphragmatic hernia without obstruction or gangrene: K44.9

## 2014-08-07 LAB — URINALYSIS, ROUTINE W REFLEX MICROSCOPIC
Bilirubin Urine: NEGATIVE
Glucose, UA: NEGATIVE mg/dL
HGB URINE DIPSTICK: NEGATIVE
KETONES UR: NEGATIVE mg/dL
LEUKOCYTES UA: NEGATIVE
Nitrite: NEGATIVE
PH: 7.5 (ref 5.0–8.0)
Protein, ur: NEGATIVE mg/dL
Specific Gravity, Urine: 1.011 (ref 1.005–1.030)
Urobilinogen, UA: 1 mg/dL (ref 0.0–1.0)

## 2014-08-07 LAB — CBC WITH DIFFERENTIAL/PLATELET
BASOS ABS: 0 10*3/uL (ref 0.0–0.1)
Basophils Relative: 0 % (ref 0–1)
EOS ABS: 0.1 10*3/uL (ref 0.0–0.7)
Eosinophils Relative: 1 % (ref 0–5)
HEMATOCRIT: 40.2 % (ref 36.0–46.0)
Hemoglobin: 13.1 g/dL (ref 12.0–15.0)
Lymphocytes Relative: 33 % (ref 12–46)
Lymphs Abs: 2.6 10*3/uL (ref 0.7–4.0)
MCH: 33.8 pg (ref 26.0–34.0)
MCHC: 32.6 g/dL (ref 30.0–36.0)
MCV: 103.6 fL — AB (ref 78.0–100.0)
Monocytes Absolute: 0.8 10*3/uL (ref 0.1–1.0)
Monocytes Relative: 10 % (ref 3–12)
Neutro Abs: 4.4 10*3/uL (ref 1.7–7.7)
Neutrophils Relative %: 56 % (ref 43–77)
PLATELETS: 181 10*3/uL (ref 150–400)
RBC: 3.88 MIL/uL (ref 3.87–5.11)
RDW: 13.4 % (ref 11.5–15.5)
WBC: 7.9 10*3/uL (ref 4.0–10.5)

## 2014-08-07 LAB — COMPREHENSIVE METABOLIC PANEL
ALBUMIN: 3.7 g/dL (ref 3.5–5.2)
ALT: 10 U/L (ref 0–35)
AST: 14 U/L (ref 0–37)
Alkaline Phosphatase: 64 U/L (ref 39–117)
Anion gap: 7 (ref 5–15)
BUN: 11 mg/dL (ref 6–23)
CALCIUM: 10 mg/dL (ref 8.4–10.5)
CO2: 30 mmol/L (ref 19–32)
CREATININE: 0.74 mg/dL (ref 0.50–1.10)
Chloride: 102 mmol/L (ref 96–112)
GFR calc Af Amer: >90 mL/min (ref >90)
GFR calc non Af Amer: 90 mL/min — ABNORMAL LOW (ref >90)
Glucose, Bld: 98 mg/dL (ref 70–99)
Potassium: 3.9 mmol/L (ref 3.5–5.1)
Sodium: 139 mmol/L (ref 135–145)
Total Bilirubin: 0.5 mg/dL (ref 0.3–1.2)
Total Protein: 7 g/dL (ref 6.0–8.3)

## 2014-08-07 LAB — RAPID URINE DRUG SCREEN, HOSP PERFORMED
AMPHETAMINES: NOT DETECTED
Barbiturates: NOT DETECTED
Benzodiazepines: NOT DETECTED
Cocaine: NOT DETECTED
Opiates: NOT DETECTED
Tetrahydrocannabinol: NOT DETECTED

## 2014-08-07 LAB — ETHANOL

## 2014-08-07 LAB — VALPROIC ACID LEVEL: Valproic Acid Lvl: 49.7 ug/mL — ABNORMAL LOW (ref 50.0–100.0)

## 2014-08-07 NOTE — Progress Notes (Signed)
Per psychiatry patient psychiatrically stable for discharge back to [facility]. Pt outpatient psych provider to adjust medications. Patient denies Si/HI/Ah/Vh. Rn to call report to [phone].   Byrd Hesselbach [phone]  ED CSW 08/07/2014 10:46 AM

## 2014-08-07 NOTE — ED Provider Notes (Signed)
CSN: [id number]     Arrival date & time 08/07/14  7829 History   First MD Initiated Contact with Patient 08/07/14 0622     Chief Complaint  Patient presents with  . Psychiatric Evaluation     (Consider location/radiation/quality/duration/timing/severity/associated sxs/prior Treatment) HPI   [name] is a(n) 64 y.o. female who presents to the ED from [facility]. Patient was sent in for screaming and yelling that began this morning. SHe has a PMH of chronic BL leg pain. Bipolar and schizoaffective disorder. History is gathered from the patient, nursing staff, EMS and facility staff. According to the facility. The patient began screaming and yelling this morning. She is not normally operate at this baseline. The nursing administrator was consultative about her behavior and asked for psych evaluation. The patient states that this morning she could not reach her call light. She states that she is paralyzed. She was screaming because no one would help her to get it. The patient also denies suicidal ideation, homicidal ideation or audiovisual hallucinations. 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 The patient later states that she is a psychiatric and she can hear her face is from "beyond the grave." The patient denies any leg pain, however, when the provider evaluates her, she screams with light touch and complains of chronic leg pain.  Past Medical History  Diagnosis Date  . Hypertension   . Bipolar disorder   . Anxiety   . Schizoaffective disorder   . Mood disorder   . Epilepsy   . CHF (congestive heart failure)   . COPD (chronic obstructive pulmonary disease)   . Thyroid disease   . Hiatal hernia   . GERD (gastroesophageal reflux disease)   . Atrial flutter   . Morbid obesity    No past surgical history on file. No family history on file. History  Substance Use Topics  . Smoking status: Former Games developer  . Smokeless tobacco: Not on file  . Alcohol  Use: No   OB History    No data available     Review of Systems  Unable to perform ROS: Psychiatric disorder      Allergies  Penicillins and Sulfa antibiotics  Home Medications   Prior to Admission medications   Medication Sig Start Date End Date Taking? Authorizing Provider  aspirin 81 MG chewable tablet Chew 81 mg by mouth daily.   Yes Historical Provider, MD  bisacodyl (DULCOLAX) 10 MG suppository Place 10 mg rectally every three (3) days as needed for mild constipation or moderate constipation.   Yes Historical Provider, MD  budesonide (PULMICORT) 0.25 MG/2ML nebulizer solution Take 0.25 mg by nebulization 2 (two) times daily.   Yes Historical Provider, MD  chlorproMAZINE (THORAZINE) 100 MG tablet Take 100 mg by mouth at bedtime.   Yes Historical Provider, MD  chlorproMAZINE (THORAZINE) 50 MG tablet Take 50 mg by mouth 3 (three) times daily as needed.   Yes Historical Provider, MD  divalproex (DEPAKOTE ER) 250 MG 24 hr tablet Take 1,250 mg by mouth at bedtime.   Yes Historical Provider, MD  lamoTRIgine (LAMICTAL) 100 MG tablet Take 100 mg by mouth 2 (two) times daily.   Yes Historical Provider, MD  levothyroxine (SYNTHROID, LEVOTHROID) 150 MCG tablet Take 150 mcg by mouth daily before breakfast.   Yes Historical Provider, MD  LORazepam (ATIVAN) 0.5 MG tablet Take 0.5 mg by mouth 3 (three) times daily as needed for anxiety.   Yes Historical Provider, MD  lurasidone (LATUDA) 40 MG TABS tablet Take 20 mg by mouth daily with breakfast.   Yes Historical Provider, MD  magnesium hydroxide (MILK OF MAGNESIA) 400 MG/5ML suspension Take 30 mLs by mouth daily as needed for mild constipation.   Yes Historical Provider, MD  Multiple Vitamin (MULTIVITAMIN WITH MINERALS) TABS tablet Take 1 tablet by mouth daily.   Yes Historical Provider, MD  omeprazole (PRILOSEC) 40 MG capsule Take 40 mg by mouth daily.   Yes Historical Provider, MD  PRESCRIPTION MEDICATION Apply 1 mL topically 2 (two) times  daily as needed (anxiety). Lorazepam 1mg /47ml topical gel   Yes Historical Provider, MD   BP 113/79 mmHg  Pulse 114  Temp(Src) 97.4 F (36.3 C) (Oral)  Resp 20  SpO2 97% Physical Exam  Constitutional: She appears well-developed and well-nourished. No distress.  HENT:  Head: Normocephalic and atraumatic.  Edentulous, speech is mumbling and difficult to understand.  Eyes: Conjunctivae are normal. No scleral icterus.  Neck: Normal range of motion.  Cardiovascular: Normal rate, regular rhythm and normal heart sounds.  Exam reveals no gallop and no friction rub.   No murmur heard. Pulmonary/Chest: Effort normal and breath sounds normal. No respiratory distress.  Abdominal: Soft. Bowel sounds are normal. She exhibits no distension and no mass. There is no tenderness. There is no guarding.  Neurological: She is alert.  Skin: Skin is warm and dry. She is not diaphoretic.  Psychiatric: She is agitated (screams intermittently) and slowed. Thought content is delusional.  Nursing note and vitals reviewed.   ED Course  Procedures (including critical care time) Labs Review Labs Reviewed  CBC WITH DIFFERENTIAL/PLATELET - Abnormal; Notable for the following:    MCV 103.6 (*)    All other components within normal limits  COMPREHENSIVE METABOLIC PANEL  URINE RAPID DRUG SCREEN (HOSP PERFORMED)  ETHANOL  VALPROIC ACID LEVEL  URINALYSIS, ROUTINE W REFLEX MICROSCOPIC    Imaging Review No results found.   EKG Interpretation None      MDM   Final diagnoses:  None    7:32 AM BP 113/79 mmHg  Pulse 114  Temp(Src) 97.4 F (36.3 C) (Oral)  Resp 20  SpO77 67% 64 year old female sent in for psychiatric evaluation. Patient intermittently answers questions lucidly. However, at times appears to be delusional in her thought content. She is intermittently screaming very loudly for no apparent reason. However, she also screams in response to any touch, which she associates with severe pain.  Basic labs are drawn. Also awaiting a Depakote level. Patient is requesting an in and out catheterization. However 2 attempts by nursing staff have been unsuccessful at this time.   patient cleared by psych for discharge. Urine is still pending.     Patient Urine negative for infection or illicit drugs.  will d/c home to her care facility.   Arthor Captain, PA-C 08/07/14 2155  Lyanne Co, MD 08/08/14 [id number]

## 2014-08-07 NOTE — ED Notes (Addendum)
Pt here by [facility] from [facility]. Pt arrives asking for coffee. EMS report pt has been calm, NAD, polite and cooperative. Staff RN at facility reports: the DON had pt sent for psych eval because pt was yelling and screaming uncontrollably. They thought she might need a psych eval because this behavior was different from her norm. Pt alert, NAD, calm, interactive, cooperative. Last ativan paste given at 0400 (usual med).  (Nursing Home: VS 115/58, HR 102, RR 20, T 96.0, CBG 123, A&O and follows commands). At this time pt is amiable and pleasantly talking to self, mumbling and appropriately answers questions, cooperative. Mentions leg pain.

## 2014-08-07 NOTE — ED Notes (Signed)
UA REMAINS PENDING EDP AWARE

## 2014-08-07 NOTE — Consult Note (Signed)
[facility] Face-to-Face Psychiatry Consult   Reason for Consult:  Mood disorder Referring Physician:  EDP Patient Identification: [name] MRN:  [id number] Principal Diagnosis: Schizoaffective disorder, bipolar type Diagnosis:   Patient Active Problem List   Diagnosis Date Noted  . Schizoaffective disorder, bipolar type [F25.0] 08/07/2014    Priority: High    Total Time spent with patient: 45 minutes  Subjective:   Unnamed [name] is a 64 y.o. female patient admitted with MOOD DISORDER, hx of Schizoaffective disorder.  HPI:  AA female, 64 years old was brought in by Ambulance for a mood disorder.  Patient has hx of Schizoaffective disorder and is residing at an assisted living facility for yelling  and screaming.  On arrival patient was calm, cooperative and answered all questions asked of her promptly.  Patient reported that she is not happy at the facility staff.  She reported that assisted living facility staff does not pay attention to her needs.  She reported that she sits on stool and urine all the time.  Patient stated she did not want to go back there.  Patient denies SI/HI/AVH.  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  Patient made good eye contact with providers and stated that she is wiling to go back to the facility if they promise to pay attention to her.  Patient will be discharged back to the facility.  HPI Elements:   Location:  Schizoaffective disorder, Bipolar type, R/O Delusion. Quality:  moderate. Severity:  severe- moderate. Timing:  acute. Duration:  Chronic mental illness. Context:  brought in by Ambulance for evaluation..  Past Medical History:  Past Medical History  Diagnosis Date  . Hypertension   . Bipolar disorder   . Anxiety   . Schizoaffective disorder   . Mood disorder   . Epilepsy   . CHF (congestive heart failure)   . COPD (chronic obstructive pulmonary disease)   . Thyroid  disease   . Hiatal hernia   . GERD (gastroesophageal reflux disease)   . Atrial flutter   . Morbid obesity    No past surgical history on file. Family History: No family history on file. Social History:  History  Alcohol Use No     History  Drug Use No    History   Social History  . Marital Status: N/A    Spouse Name: N/A  . Number of Children: N/A  . Years of Education: N/A   Social History Main Topics  . Smoking status: Former Games developer  . Smokeless tobacco: Not on file  . Alcohol Use: No  . Drug Use: No  . Sexual Activity: Not on file   Other Topics Concern  . None   Social History Narrative  . None   Additional Social History:    Pain Medications: SEE MAR Prescriptions: SEE MAR Over the Counter: SEE MAR History of alcohol / drug use?: No history of alcohol / drug abuse     Allergies:   Allergies  Allergen Reactions  . Penicillins     Per MAR  . Sulfa Antibiotics     Per MAR    Vitals: Blood pressure 124/70, pulse 102, temperature 98.1 F (36.7 C), temperature source Rectal, resp. rate 18, SpO2 91 %.  Risk to Self: Suicidal Ideation: No Suicidal Intent: No Is patient at risk for suicide?: No Suicidal Plan?: No Access to Means: No What has been your use of drugs/alcohol within the last  12 months?:  (n/a) How many times?:  (n/a) Other Self Harm Risks:  (n/a) Triggers for Past Attempts: Other (Comment) (none reported ) Intentional Self Injurious Behavior: None Risk to Others: Homicidal Ideation: No Thoughts of Harm to Others: No Current Homicidal Intent: No Current Homicidal Plan: No Access to Homicidal Means: No Identified Victim:  (n/a) History of harm to others?: No Assessment of Violence: None Noted Violent Behavior Description:  (Patient is cooperative but irritable ) Does patient have access to weapons?: No Criminal Charges Pending?: No Does patient have a court date: No Prior Inpatient Therapy: Prior Inpatient Therapy: No (pt denies  ) Prior Therapy Dates:  (n/a) Prior Therapy Facilty/Provider(s):  (n/a) Reason for Treatment:  (n/a) Prior Outpatient Therapy: Prior Outpatient Therapy: No Prior Therapy Dates:  (n/a) Prior Therapy Facilty/Provider(s):  (n/a) Reason for Treatment:  (n/a)  No current facility-administered medications for this encounter.   Current Outpatient Prescriptions  Medication Sig Dispense Refill  . aspirin 81 MG chewable tablet Chew 81 mg by mouth daily.    . bisacodyl (DULCOLAX) 10 MG suppository Place 10 mg rectally every three (3) days as needed for mild constipation or moderate constipation.    . budesonide (PULMICORT) 0.25 MG/2ML nebulizer solution Take 0.25 mg by nebulization 2 (two) times daily.    . chlorproMAZINE (THORAZINE) 100 MG tablet Take 100 mg by mouth at bedtime.    . chlorproMAZINE (THORAZINE) 50 MG tablet Take 50 mg by mouth 3 (three) times daily as needed.    . divalproex (DEPAKOTE ER) 250 MG 24 hr tablet Take 1,250 mg by mouth at bedtime.    . lamoTRIgine (LAMICTAL) 100 MG tablet Take 100 mg by mouth 2 (two) times daily.    Marland Kitchen levothyroxine (SYNTHROID, LEVOTHROID) 150 MCG tablet Take 150 mcg by mouth daily before breakfast.    . LORazepam (ATIVAN) 0.5 MG tablet Take 0.5 mg by mouth 3 (three) times daily as needed for anxiety.    Marland Kitchen lurasidone (LATUDA) 40 MG TABS tablet Take 20 mg by mouth daily with breakfast.    . magnesium hydroxide (MILK OF MAGNESIA) 400 MG/5ML suspension Take 30 mLs by mouth daily as needed for mild constipation.    . Multiple Vitamin (MULTIVITAMIN WITH MINERALS) TABS tablet Take 1 tablet by mouth daily.    Marland Kitchen omeprazole (PRILOSEC) 40 MG capsule Take 40 mg by mouth daily.    Marland Kitchen PRESCRIPTION MEDICATION Apply 1 mL topically 2 (two) times daily as needed (anxiety). Lorazepam /65ml topical gel      Musculoskeletal: Strength & Muscle Tone: Was seen lying down in her bed Gait & Station: seen in bed Patient leans: seen in bed  Psychiatric Specialty Exam:      Blood pressure 124/70, pulse 102, temperature 98.1 F (36.7 C), temperature source Rectal, resp. rate 18, SpO2 91 %.There is no height or weight on file to calculate BMI.  General Appearance: Casual and Fairly Groomed  Eye Contact::  Good  Speech:  Garbled and Normal Rate  Volume:  Normal  Mood:  Angry and Euthymic  Affect:  Congruent  Thought Process:  Coherent and Disorganized  Orientation:  Full (Time, Place, and Person)  Thought Content:  Delusions  Suicidal Thoughts:  No  Homicidal Thoughts:  No  Memory:  Immediate;   Fair Recent;   Fair Remote;   Fair  Judgement:  Impaired  Insight:  Fair  Psychomotor Activity:  Normal  Concentration:  Fair  Recall:  Poor  Fund of Knowledge:Fair  Language: Fair  Akathisia:  NA  Handed:  Right  AIMS (if indicated):     Assets:  Desire for Improvement  ADL's:  Intact  Cognition: Impaired,  Moderate  Sleep:      Medical Decision Making: Established Problem, Stable/Improving (1)  Treatment Plan Summary: Plan Discharge back to assisted living facility  Plan:  No evidence of imminent risk to self or others at present.   Patient does not meet criteria for psychiatric inpatient admission. Supportive therapy provided about ongoing stressors. Send back to assisted living facility Disposition: Discharge home  Earney Navy   PMHNP-BC 08/07/2014 10:55 AM Patient seen face-to-face for psychiatric evaluation, chart reviewed and case discussed with the physician extender and developed treatment plan. Reviewed the information documented and agree with the treatment plan. Thedore Mins, MD

## 2014-08-07 NOTE — ED Notes (Signed)
SECURITY CLEARED

## 2014-08-07 NOTE — ED Notes (Signed)
ED PA at BS 

## 2014-08-07 NOTE — ED Notes (Addendum)
Attempted I&O cath x2  (unsuccessful) with 3 staff. Pt yells and screams when touching skin, also with venipuncture and I&O, but pt screamed prior to stick and cath. Yelling seems to be behavioral. EDPA & EDP aware.

## 2014-08-07 NOTE — Discharge Instructions (Signed)
Patient appears medically clear and cleared by psych doctor. Safe to return.
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
## 2014-08-07 NOTE — ED Notes (Signed)
Bed: WA03 Expected date: 08/07/14 Expected time: 5:12 AM Means of arrival: Ambulance Comments: Back pain/1096

## 2014-08-07 NOTE — BH Assessment (Signed)
TTS Harriett Sine) will inform TTS Jessie Foot) of the consult.

## 2014-08-07 NOTE — ED Notes (Signed)
TRANSFERRED BY Chrissie Noa NT. BELONGING AG X 1 WITH THIS PT

## 2014-08-07 NOTE — ED Notes (Addendum)
No yelling and screaming at this time. Alert, NAD, calm.

## 2014-08-07 NOTE — ED Notes (Signed)
[facility] notified for transport 

## 2014-08-07 NOTE — ED Notes (Signed)
Pt cooperative, until being stuck for lab draw, resistant x2, unsuccessful x2 with 22g butterfly.

## 2014-08-14 ENCOUNTER — Non-Acute Institutional Stay (SKILLED_NURSING_FACILITY): Payer: Medicare Other | Admitting: Adult Health

## 2014-08-14 DIAGNOSIS — E119 Type 2 diabetes mellitus without complications: Secondary | ICD-10-CM

## 2014-08-14 DIAGNOSIS — I482 Chronic atrial fibrillation, unspecified: Secondary | ICD-10-CM

## 2014-08-14 DIAGNOSIS — K219 Gastro-esophageal reflux disease without esophagitis: Secondary | ICD-10-CM | POA: Diagnosis not present

## 2014-08-14 DIAGNOSIS — J449 Chronic obstructive pulmonary disease, unspecified: Secondary | ICD-10-CM | POA: Diagnosis not present

## 2014-08-14 DIAGNOSIS — F250 Schizoaffective disorder, bipolar type: Secondary | ICD-10-CM

## 2014-08-14 DIAGNOSIS — E039 Hypothyroidism, unspecified: Secondary | ICD-10-CM

## 2014-08-21 IMAGING — CR DG CHEST 2V
2 series · 2 of 2 positions shown · non-contrast
Comparison: Prior chest x-ray 02/21/2013

CLINICAL DATA: Chest pain, altered mental status

EXAM:
CHEST  2 VIEW

[x chest ap]
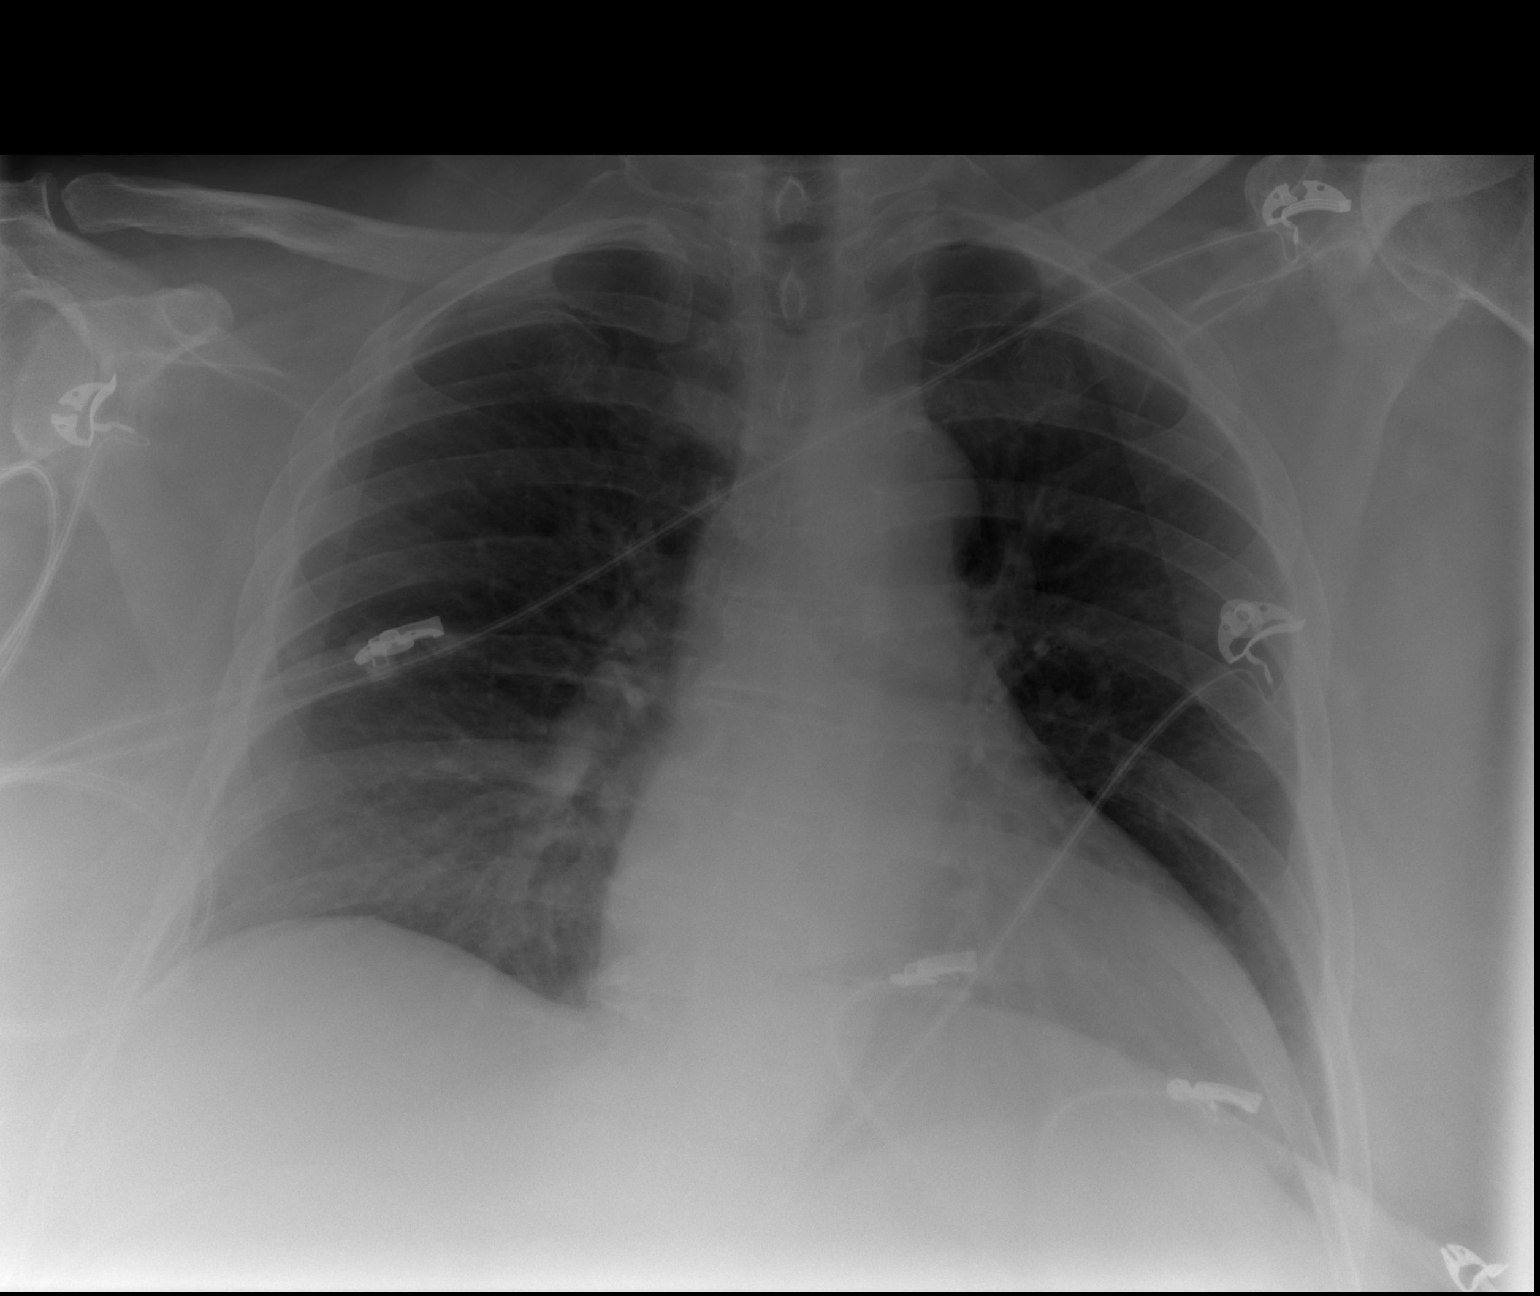

[w chest lat]
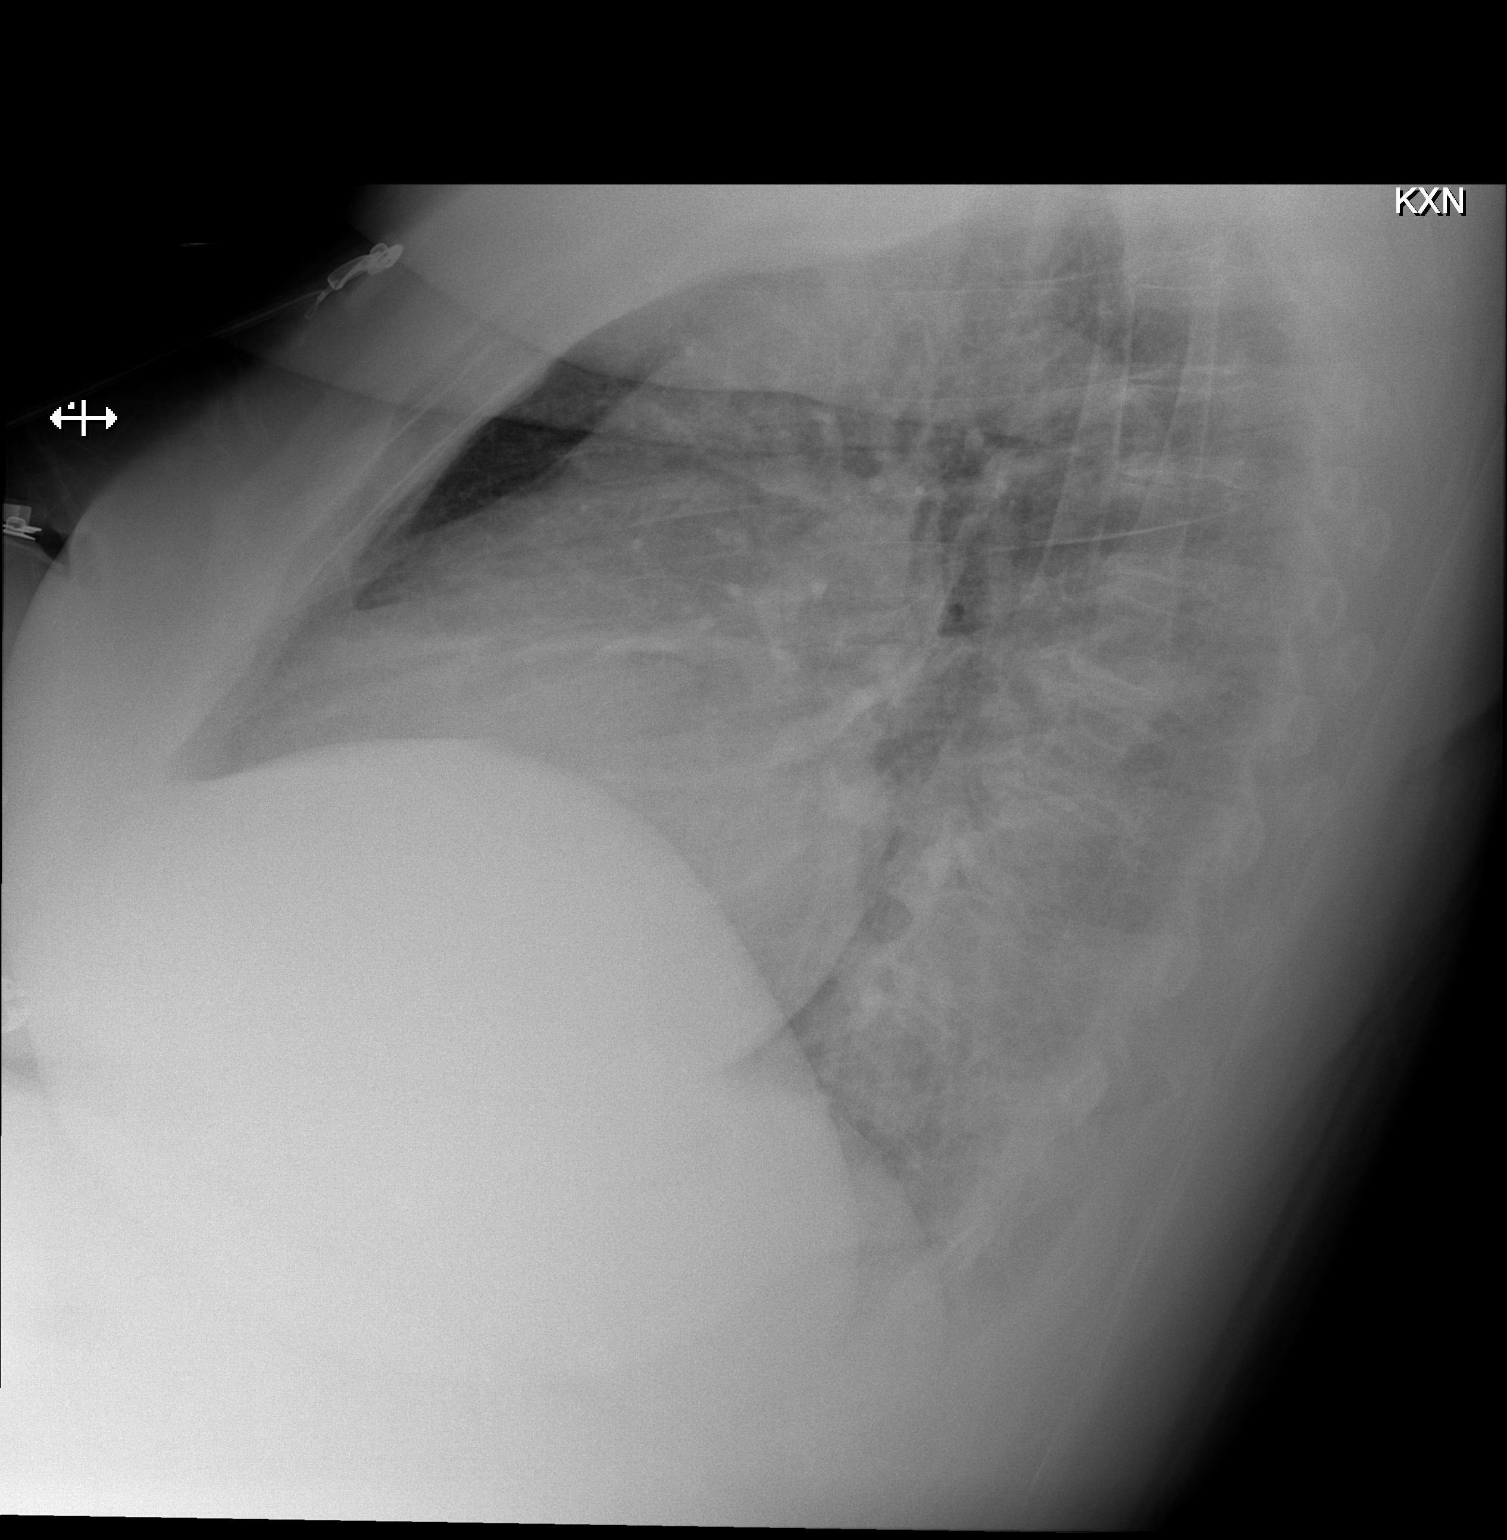

[2 of 2 positions shown; findings below may reference images not displayed]

FINDINGS: Stable cardiomegaly with left ventricular prominence. Interval
development of a posterior right lower lobe opacity. Negative for
pulmonary embolism, pleural effusion or pneumothorax. No acute
osseous abnormality. Degenerative changes noted at the left
shoulder.
IMPRESSION: New right lower lobe opacity concerning for pneumonia in the
appropriate clinical setting.

Stable cardiomegaly with left ventricular prominence.

## 2014-09-17 ENCOUNTER — Non-Acute Institutional Stay (SKILLED_NURSING_FACILITY): Payer: Medicare Other | Admitting: Adult Health

## 2014-09-17 DIAGNOSIS — J449 Chronic obstructive pulmonary disease, unspecified: Secondary | ICD-10-CM

## 2014-09-17 DIAGNOSIS — E119 Type 2 diabetes mellitus without complications: Secondary | ICD-10-CM

## 2014-09-17 DIAGNOSIS — I482 Chronic atrial fibrillation, unspecified: Secondary | ICD-10-CM

## 2014-09-17 DIAGNOSIS — E039 Hypothyroidism, unspecified: Secondary | ICD-10-CM

## 2014-09-17 DIAGNOSIS — F250 Schizoaffective disorder, bipolar type: Secondary | ICD-10-CM

## 2014-09-17 DIAGNOSIS — K219 Gastro-esophageal reflux disease without esophagitis: Secondary | ICD-10-CM

## 2014-09-22 ENCOUNTER — Encounter: Payer: Self-pay | Admitting: Adult Health

## 2014-09-22 NOTE — Progress Notes (Signed)
Patient ID: [name], female   DOB: 26-May-1951, 64 y.o.   MRN: [id number]  [facility]     Allergies  Allergen Reactions  . Food Rash    "lasagna"= rash from the noodles  . Penicillins Hives  . Sulfamethoxazole-Trimethoprim Hives and Nausea And Vomiting       Chief Complaint  Patient presents with  . Medical Management of Chronic Issues    HPI:  She is a long term resident of this facility being seen for the management of her chronic illnesses. Overall her status remains without change. She did not tolerate the wean off the thorazine to latuda. Since restarting her thorazine she is back at baseline. She is unable to fully participate in the hpi or ros. There are no nursing concerns at this time.    Past Medical History  Diagnosis Date  . Schizoaffective disorder   . Urinary tract infection   . Hypertension   . GERD (gastroesophageal reflux disease)   . Hypothyroidism   . Obesity   . Asthma   . COPD (chronic obstructive pulmonary disease)   . CHF (congestive heart failure)   . Seizures   . Morbid obesity   . Chronic pain   . Hypercholesteremia   . Anginal pain   . Myocardial infarction 2002  . Diabetes mellitus     "I'm not diabetic anymore" (05/23/2012)  . Anemia   . History of blood transfusion 1993    "when I had my hysterectomy" (05/23/2012)  . H/O hiatal hernia   . Daily headache   . Arthritis     "severe; all over my body" (05/23/2012)  . Hypoventilation syndrome     [name] 05/30/2012  . Atrial fibrillation   . Atrial flutter, paroxysmal     [name] 05/30/2012  . Exertional dyspnea   . Shortness of breath     "all the time lately" (05/30/2012)  . Pneumonia     "several times; including now" (04/23/2013)  . Anxiety   . Episodic mood disorder   . Psychosis   . Schizophrenia   . 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 CHOLELITHIASIS 11/30/2009    Qualifier: Diagnosis of  By: Brynda Rim       Past Surgical History  Procedure Laterality Date  . Tubal ligation  1998  . Cholecystectomy  ?2008  . Cardiac catheterization  2008  . Vaginal hysterectomy  1993    VITAL SIGNS BP 128/77 mmHg  Pulse 87  Ht 5\' 3"  (1.6 m)  Wt 319 lb (144.697 kg)  BMI 56.52 kg/m2  SpO2 98%   Outpatient Encounter Prescriptions as of 08/14/2014  Medication Sig  . aspirin EC 81 MG tablet Take 81 mg by mouth every morning.   . bisacodyl (DULCOLAX) 10 MG suppository Place 10 mg rectally every 3 (three) days as needed for mild constipation or moderate constipation.  . budesonide (PULMICORT) 0.25 MG/2ML nebulizer solution Take 0.25 mg by nebulization 2 (two) times daily.  . chlorproMAZINE (THORAZINE) 200 MG tablet Take 0.5 tablets (100 mg total) by mouth at bedtime. (Patient taking differently: Take 100 mg by mouth. 100mg  daily at bedtime and 50mg  BID)  . divalproex (DEPAKOTE ER) 500 MG 24 hr tablet Take 1,250 mg by mouth at bedtime.   . lamoTRIgine (LAMICTAL) 100 MG tablet Take 100 mg by mouth 2 (two) times daily.  Marland Kitchen levothyroxine (SYNTHROID, LEVOTHROID) 150 MCG tablet Take 150 mcg by mouth  every morning. For thyroid disease.  Marland Kitchen LORazepam (ATIVAN) 0.5 MG tablet Take one tablet by mouth three times daily as needed for anxiety  . LORazepam (ATIVAN) 2 MG/ML concentrated solution Take 1 mg by mouth 2 (two) times daily as needed for anxiety.  . magnesium hydroxide (MILK OF MAGNESIA) 400 MG/5ML suspension Take 30 mLs by mouth daily as needed for mild constipation.  . Multiple Vitamin (MULTIVITAMIN) tablet Take 1 tablet by mouth daily.  Marland Kitchen omeprazole (PRILOSEC) 20 MG capsule Take 40 mg by mouth every morning.      SIGNIFICANT DIAGNOSTIC EXAM She is not a candidate for a mammogram or colonoscopy or dexa scan    05-10-14: chest x-ray: modest cardiomegaly with clear lungs    LABS REVIEWED:   04-22-14: wbc 6.2; hgb 12.6; hct 38.3; mcv 103.1; plt 216 glucose 78; bun 18.5; creat 0.61; k+4.4; na++137; liver  normal albumin 3.4; tsh 3.89  depakote 57  04-29-14: vit b12: 424; folic 15.0 07-31-14: wbc 4.8; hgb 12.3; hct 41.1; mcv 106.6; plt 198 ;glucose 108; bun 9.3; creat 0.56; k+4.6; na++141; liver normal albumin 3.6     ROS Unable to perform ROS   Physical Exam Constitutional: No distress.  Morbid obesity   Neck: Neck supple. No JVD present.  Cardiovascular: Normal rate, regular rhythm and intact distal pulses.   Respiratory: Effort normal and breath sounds normal. No respiratory distress. She has no wheezes.  GI: Soft. Bowel sounds are normal. She exhibits no distension. There is no tenderness.  Musculoskeletal: She exhibits no edema.  Is able to move extremities   Neurological: She is alert.  Skin: Skin is warm and dry. She is not diaphoretic.    ASSESSMENT/ PLAN:   1.  Hypothyroidism: is stable her tsh is 3.89; will continue synthroid 150 mcg daily   2. Afib: heart rate is stable will continue asa 81 mg daily  Will monitor her status.   3. COPD: is stable will continue pulmicort neb treatment twice daily   4. Gerd: will continue prilosec 40 mg daily   5. Schizophrenia: she is without change in status; she does talk to herself; will continue thorazine 50 mg twice daily and 100 mg nightly; depakote 1250 mg daily; and lamictal 100 mg twice daily to stabilize mood. Will continue ativan 0.5 mg three times daily as needed for anxiety and will monitor  Is followed by psych services   6. Diabetes: she is presently not on medications; will not make changes will monitor her status.      Synthia Innocent NP [facility]  Contact [phone] Monday through Friday 8am- 5pm  After hours call [phone]

## 2014-10-13 ENCOUNTER — Non-Acute Institutional Stay (SKILLED_NURSING_FACILITY): Payer: Medicare Other | Admitting: Internal Medicine

## 2014-10-13 ENCOUNTER — Encounter: Payer: Self-pay | Admitting: Internal Medicine

## 2014-10-13 DIAGNOSIS — F250 Schizoaffective disorder, bipolar type: Secondary | ICD-10-CM

## 2014-10-13 DIAGNOSIS — I482 Chronic atrial fibrillation, unspecified: Secondary | ICD-10-CM

## 2014-10-13 DIAGNOSIS — E039 Hypothyroidism, unspecified: Secondary | ICD-10-CM | POA: Diagnosis not present

## 2014-10-13 DIAGNOSIS — I10 Essential (primary) hypertension: Secondary | ICD-10-CM

## 2014-10-13 DIAGNOSIS — F209 Schizophrenia, unspecified: Secondary | ICD-10-CM | POA: Diagnosis not present

## 2014-10-13 DIAGNOSIS — J449 Chronic obstructive pulmonary disease, unspecified: Secondary | ICD-10-CM | POA: Diagnosis not present

## 2014-10-13 NOTE — Progress Notes (Signed)
Patient ID: [name], female   DOB: Mar 01, 1951, 64 y.o.   MRN: [id number]    [facility]     Place of Service: SNF [phone]   10/13/14  Allergies  Allergen Reactions  . Food Rash    "lasagna"= rash from the noodles  . Penicillins Hives  . Sulfamethoxazole-Trimethoprim Hives and Nausea And Vomiting    Chief Complaint  Patient presents with  . Medical Management of Chronic Issues    HPI:  64 yo female seen today for f/u. She is a poor historian due to psych dx. Hx obtained from chart. No nursing issues. No falls.  Mood stable on lorazepam, depakote and thorazine  No seizures. She takes depakote and lamictal  Thyroid stable on levothyroxine  No COPD/asthma exac. She takes pulmicort  BP controlled with diet. She takes an ASA daily  GERD controlled on omeprazole  Past Medical History  Diagnosis Date  . Schizoaffective disorder   . Urinary tract infection   . Hypertension   . GERD (gastroesophageal reflux disease)   . Hypothyroidism   . Obesity   . Asthma   . COPD (chronic obstructive pulmonary disease)   . CHF (congestive heart failure)   . Seizures   . Morbid obesity   . Chronic pain   . Hypercholesteremia   . Anginal pain   . Myocardial infarction 2002  . Diabetes mellitus     "I'm not diabetic anymore" (05/23/2012)  . Anemia   . History of blood transfusion 1993    "when I had my hysterectomy" (05/23/2012)  . H/O hiatal hernia   . Daily headache   . Arthritis     "severe; all over my body" (05/23/2012)  . Hypoventilation syndrome     [name] 05/30/2012  . Atrial fibrillation   . Atrial flutter, paroxysmal     [name] 05/30/2012  . Exertional dyspnea   . Shortness of breath     "all the time lately" (05/30/2012)  . Pneumonia     "several times; including now" (04/23/2013)  . Anxiety   . Episodic mood disorder   . Psychosis   . Schizophrenia   . RETARDATION, MENTAL, MILD 02/06/2007    Qualifier: Diagnosis of  By: Barbaraann Barthel  MD, [place]    . CHOLELITHIASIS 11/30/2009    Qualifier: Diagnosis of  By: Brynda Rim     Past Surgical History  Procedure Laterality Date  . Tubal ligation  1998  . Cholecystectomy  ?2008  . Cardiac catheterization  2008  . Vaginal hysterectomy  1993   History   Social History  . Marital Status: Widowed    Spouse Name: N/A  . Number of Children: N/A  . Years of Education: N/A   Social History Main Topics  . Smoking status: Former Smoker -- 2.00 packs/day for 12 years    Types: Cigarettes    Quit date: 06/29/2000  . Smokeless tobacco: Current User    Types: Chew  . Alcohol Use: No  . Drug Use: No  . Sexual Activity: Yes    Birth Control/ Protection: Post-menopausal   Other Topics Concern  . None   Social History Narrative     Medications: Patient's Medications  New Prescriptions   No medications on file  Previous Medications   ASPIRIN EC 81 MG TABLET    Take 81 mg by mouth every morning.    BISACODYL (DULCOLAX) 10 MG SUPPOSITORY    Place 10 mg rectally every 3 (three) days as needed  for mild constipation or moderate constipation.   BUDESONIDE (PULMICORT) 0.25 MG/2ML NEBULIZER SOLUTION    Take 0.25 mg by nebulization 2 (two) times daily.   CHLORPROMAZINE (THORAZINE) 200 MG TABLET    Take 0.5 tablets (100 mg total) by mouth at bedtime.   DIVALPROEX (DEPAKOTE ER) 500 MG 24 HR TABLET    Take 1,250 mg by mouth at bedtime.    LAMOTRIGINE (LAMICTAL) 100 MG TABLET    Take 100 mg by mouth 2 (two) times daily.   LEVOTHYROXINE (SYNTHROID, LEVOTHROID) 150 MCG TABLET    Take 150 mcg by mouth every morning. For thyroid disease.   LORAZEPAM (ATIVAN) 0.5 MG TABLET    Take one tablet by mouth three times daily as needed for anxiety   LORAZEPAM (ATIVAN) 2 MG/ML CONCENTRATED SOLUTION    Take 1 mg by mouth 2 (two) times daily as needed for anxiety.   MAGNESIUM HYDROXIDE (MILK OF MAGNESIA) 400 MG/5ML SUSPENSION    Take 30 mLs by mouth daily as needed for mild constipation.    MULTIPLE VITAMIN (MULTIVITAMIN) TABLET    Take 1 tablet by mouth daily.   OMEPRAZOLE (PRILOSEC) 20 MG CAPSULE    Take 40 mg by mouth every morning.   Modified Medications   No medications on file  Discontinued Medications   No medications on file     Review of Systems  Unable to perform ROS: Psychiatric disorder    Filed Vitals:   10/13/14 1542  BP: 116/66  Pulse: 75  Temp: 97.1 F (36.2 C)  Weight: 328 lb (148.78 kg)  SpO2: 98%   Body mass index is 58.12 kg/(m^2).  Physical Exam  Constitutional: She appears well-developed and well-nourished. No distress.  HENT:  Mouth/Throat: Oropharynx is clear and moist. No oropharyngeal exudate.  Eyes: Pupils are equal, round, and reactive to light. No scleral icterus.  Neck: Neck supple. Carotid bruit is not present. No tracheal deviation present. No thyromegaly present.  Cardiovascular: Normal rate, regular rhythm and intact distal pulses.  Exam reveals no gallop and no friction rub.   Murmur (1/6 SEM) heard. No LE edema b/l. no calf TTP.   Pulmonary/Chest: Effort normal and breath sounds normal. No stridor. No respiratory distress. She has no wheezes. She has no rales.  Abdominal: Soft. Bowel sounds are normal. She exhibits no distension and no mass. There is no hepatomegaly. There is no tenderness. There is no rebound and no guarding.  Lymphadenopathy:    She has no cervical adenopathy.  Neurological: She is alert. She has normal reflexes.  Skin: Skin is warm and dry. No rash noted.  Psychiatric: Her behavior is normal. Her mood appears anxious. Her speech is rapid and/or pressured. She is not agitated.     Labs reviewed: No visits with results within 3 Month(s) from this visit. Latest known visit with results is:  Nursing Home on 04/23/2014  Component Date Value Ref Range Status  . Hemoglobin 01/30/2014 12.6  12.0 - 16.0 g/dL Final  . HCT 16/03/9603 39  36 - 46 % Final  . Platelets 01/30/2014 185  150 - 399 K/L Final  .  WBC 01/30/2014 5.7   Final  . Glucose 01/30/2014 84   Final  . BUN 01/30/2014 9  4 - 21 mg/dL Final  . Creatinine 54/02/8118 0.5  0.5 - 1.1 mg/dL Final  . Potassium 14/78/2956 3.8  3.4 - 5.3 mmol/L Final  . Sodium 01/30/2014 141  137 - 147 mmol/L Final     Assessment/Plan  ICD-9-CM ICD-10-CM   1. Schizophrenia, unspecified type - stable 295.90 F20.9   2. Schizoaffective disorder, bipolar type - stable 295.70 F25.0   3. Chronic atrial fibrillation - rate controlled 427.31 I48.2   4. Chronic obstructive pulmonary disease, unspecified COPD, unspecified chronic bronchitis type - stable 496 J44.9   5. Hypothyroidism, unspecified hypothyroidism type - stable 244.9 E03.9   6. HYPERTENSION, BENIGN ESSENTIAL - controlled 401.1 I10    --check depakote level and TSH  --Pt is medically stable on current tx plan. Continue current medications as ordered. PT/OT/ST as indicated. Will follow   Zev Blue S. Ancil Linsey  [facility] [address] [place], [place] [address] [phone] Office (Wednesdays and Fridays 8 AM - 5 PM) [phone] Cell (Monday-Friday 8 AM - 5 PM)

## 2014-10-15 ENCOUNTER — Encounter: Payer: Self-pay | Admitting: *Deleted

## 2014-10-19 NOTE — Progress Notes (Signed)
Patient ID: [name], female   DOB: 01-24-1951, 64 y.o.   MRN: [id number]  [facility]     Allergies  Allergen Reactions  . Food Rash    "lasagna"= rash from the noodles  . Penicillins Hives  . Sulfamethoxazole-Trimethoprim Hives and Nausea And Vomiting       Chief Complaint  Patient presents with  . Medical Management of Chronic Issues    HPI:  She is a long term resident of this facility being seen for the management of her chronic illnesses. She did not tolerate the change in her thorazine; this was restarted. She is not voicing any complaints. She cannot fully participate in the hpi or ros. There are no nursing concerns today.    Past Medical History  Diagnosis Date  . Schizoaffective disorder   . Urinary tract infection   . Hypertension   . GERD (gastroesophageal reflux disease)   . Hypothyroidism   . Obesity   . Asthma   . COPD (chronic obstructive pulmonary disease)   . CHF (congestive heart failure)   . Seizures   . Morbid obesity   . Chronic pain   . Hypercholesteremia   . Anginal pain   . Myocardial infarction 2002  . Diabetes mellitus     "I'm not diabetic anymore" (05/23/2012)  . Anemia   . History of blood transfusion 1993    "when I had my hysterectomy" (05/23/2012)  . H/O hiatal hernia   . Daily headache   . Arthritis     "severe; all over my body" (05/23/2012)  . Hypoventilation syndrome     [name] 05/30/2012  . Atrial fibrillation   . Atrial flutter, paroxysmal     [name] 05/30/2012  . Exertional dyspnea   . Shortness of breath     "all the time lately" (05/30/2012)  . Pneumonia     "several times; including now" (04/23/2013)  . Anxiety   . Episodic mood disorder   . Psychosis   . Schizophrenia   . 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 CHOLELITHIASIS 11/30/2009    Qualifier: Diagnosis of  By: Brynda Rim      Past Surgical History  Procedure Laterality Date  .  Tubal ligation  1998  . Cholecystectomy  ?2008  . Cardiac catheterization  2008  . Vaginal hysterectomy  1993    VITAL SIGNS BP 128/77 mmHg  Pulse 87  Ht  (1.6 m)  Wt 320 lb (145.151 kg)  BMI 56.70 kg/m2  SpO2 98%   Outpatient Encounter Prescriptions as of 09/17/2014   Medication Sig  . aspirin EC 81 MG tablet Take 81 mg by mouth every morning. For atril flutter  . bisacodyl (DULCOLAX) 10 MG suppository Place 10 mg rectally every 3 (three) days as needed for mild constipation or moderate constipation.  . budesonide (PULMICORT) 0.25 MG/2ML nebulizer solution Take 0.25 mg by nebulization 2 (two) times daily. For COPD  . chlorproMAZINE (THORAZINE) 200 MG tablet Take 0.5 tablets (100 mg total) by mouth at bedtime. (Patient taking differently: Take 100 mg by mouth.  daily at bedtime and  BID)  . divalproex (DEPAKOTE ER) 500 MG 24 hr tablet Take 1,250 mg by mouth at bedtime. For mood disorder  . lamoTRIgine (LAMICTAL) 100 MG tablet Take 100 mg by mouth 2 (two) times daily. For mood disorder  . levothyroxine (SYNTHROID, LEVOTHROID) 150 MCG tablet Take 150 mcg by mouth every morning.  For thyroid disease.  Marland Kitchen LORazepam (ATIVAN) 0.5 MG tablet Take one tablet by mouth three times daily as needed for anxiety  . LORazepam (ATIVAN) 2 MG/ML concentrated solution Take 1 mg by mouth 2 (two) times daily as needed for anxiety.  . magnesium hydroxide (MILK OF MAGNESIA) 400 MG/5ML suspension Take 30 mLs by mouth daily as needed for mild constipation.  . Multiple Vitamin (MULTIVITAMIN) tablet Take 1 tablet by mouth daily. For supplement  . omeprazole (PRILOSEC) 20 MG capsule Take 40 mg by mouth every morning. For reflux  . [DISCONTINUED] ferrous sulfate 325 (65 FE) MG tablet Take 325 mg by mouth 2 (two) times daily.       SIGNIFICANT DIAGNOSTIC EXAM She is not a candidate for a mammogram or colonoscopy or dexa scan    05-10-14: chest x-ray: modest cardiomegaly with clear lungs    LABS  REVIEWED:   04-22-14: wbc 6.2; hgb 12.6; hct 38.3; mcv 103.1; plt 216 glucose 78; bun 18.5; creat 0.61; k+4.4; na++137; liver normal albumin 3.4; tsh 3.89  depakote 57  04-29-14: vit b12: 424; folic 15.0 07-03-14 hgb a1c 4.7 07-08-14: stool for guaiac: neg  07-31-14: wbc 4.8; hgb 12.3; hct 41.1; mcv 106.6; plt 198 ;glucose 108; bun 9.3; creat 0.56; k+4.6; na++141; liver normal albumin 3.6      ROS Unable to perform ROS    Physical Exam Constitutional: No distress.  Morbid obesity   Neck: Neck supple. No JVD present.  Cardiovascular: Normal rate, regular rhythm and intact distal pulses.   Respiratory: Effort normal and breath sounds normal. No respiratory distress. She has no wheezes.  GI: Soft. Bowel sounds are normal. She exhibits no distension. There is no tenderness.  Musculoskeletal: She exhibits no edema.  Is able to move extremities   Neurological: She is alert.  Skin: Skin is warm and dry. She is not diaphoretic.      ASSESSMENT/ PLAN:   1.  Hypothyroidism: is stable her tsh is 3.89; will continue synthroid 150 mcg daily   2. Afib: heart rate is stable will continue asa 81 mg daily  Will monitor her status.   3. COPD: is stable will continue pulmicort neb treatment twice daily   4. Gerd: will continue prilosec 40 mg daily   5. Schizophrenia: she is without change in status; she does talk to herself; will continue thorazine 50 mg twice daily and 100 mg nightly; depakote 1250 mg daily; and lamictal 100 mg twice daily to stabilize mood. Will continue ativan 0.5 mg three times daily as needed for anxiety and will monitor  Is followed by psych services   6. Diabetes: she is presently not on medications; will not make changes will monitor her status. Her hgb a1c is 4.7 will get a urin for micro-albumin       Synthia Innocent NP [facility]  Contact [phone] Monday through Friday 8am- 5pm  After hours call [phone]

## 2014-11-27 ENCOUNTER — Non-Acute Institutional Stay (SKILLED_NURSING_FACILITY): Payer: Medicare Other | Admitting: Adult Health

## 2014-11-27 DIAGNOSIS — E039 Hypothyroidism, unspecified: Secondary | ICD-10-CM | POA: Diagnosis not present

## 2014-11-27 DIAGNOSIS — J449 Chronic obstructive pulmonary disease, unspecified: Secondary | ICD-10-CM

## 2014-11-27 DIAGNOSIS — I482 Chronic atrial fibrillation, unspecified: Secondary | ICD-10-CM

## 2014-11-27 DIAGNOSIS — I10 Essential (primary) hypertension: Secondary | ICD-10-CM

## 2014-11-27 DIAGNOSIS — F250 Schizoaffective disorder, bipolar type: Secondary | ICD-10-CM

## 2014-11-27 DIAGNOSIS — K219 Gastro-esophageal reflux disease without esophagitis: Secondary | ICD-10-CM | POA: Diagnosis not present

## 2014-11-27 DIAGNOSIS — E119 Type 2 diabetes mellitus without complications: Secondary | ICD-10-CM

## 2014-11-27 DIAGNOSIS — G8929 Other chronic pain: Secondary | ICD-10-CM

## 2014-12-30 ENCOUNTER — Encounter: Payer: Self-pay | Admitting: Adult Health

## 2014-12-30 NOTE — Progress Notes (Signed)
Patient ID: [name], female   DOB: May 07, 1951, 64 y.o.   MRN: [id number]  [facility]     Allergies  Allergen Reactions  . Food Rash    "lasagna"= rash from the noodles  . Penicillins Hives  . Sulfamethoxazole-Trimethoprim Hives and Nausea And Vomiting       Chief Complaint  Patient presents with  . Medical Management of Chronic Issues    HPI:  She is a long term resident of this facility being seen for the management of her chronic illnesses. Overall her status is stable. She does continue to have hallucinations present. She is unable to fully participate in the hpi or ros. There are no nursing concerns at this time.    Past Medical History  Diagnosis Date  . Schizoaffective disorder   . Urinary tract infection   . Hypertension   . GERD (gastroesophageal reflux disease)   . Hypothyroidism   . Obesity   . Asthma   . COPD (chronic obstructive pulmonary disease)   . CHF (congestive heart failure)   . Seizures   . Morbid obesity   . Chronic pain   . Hypercholesteremia   . Anginal pain   . Myocardial infarction 2002  . Diabetes mellitus     "I'm not diabetic anymore" (05/23/2012)  . Anemia   . History of blood transfusion 1993    "when I had my hysterectomy" (05/23/2012)  . H/O hiatal hernia   . Daily headache   . Arthritis     "severe; all over my body" (05/23/2012)  . Hypoventilation syndrome     [name] 05/30/2012  . Atrial fibrillation   . Atrial flutter, paroxysmal     [name] 05/30/2012  . Exertional dyspnea   . Shortness of breath     "all the time lately" (05/30/2012)  . Pneumonia     "several times; including now" (04/23/2013)  . Anxiety   . Episodic mood disorder   . Psychosis   . Schizophrenia   . 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 CHOLELITHIASIS 11/30/2009    Qualifier: Diagnosis of  By: Brynda Rim      Past Surgical History  Procedure Laterality Date  . Tubal ligation   1998  . Cholecystectomy  ?2008  . Cardiac catheterization  2008  . Vaginal hysterectomy  1993    VITAL SIGNS BP 131/58 mmHg  Pulse 78  Ht  (1.6 m)  Wt 338 lb (153.316 kg)  BMI 59.89 kg/m2  SpO2 97%   Outpatient Encounter Prescriptions as of 11/27/2014  Medication Sig  . aspirin EC 81 MG tablet Take 81 mg by mouth every morning. For atril flutter  . bisacodyl (DULCOLAX) 10 MG suppository Place 10 mg rectally every 3 (three) days as needed for mild constipation or moderate constipation.  . budesonide (PULMICORT) 0.25 MG/2ML nebulizer solution Take 0.25 mg by nebulization 2 (two) times daily. For COPD  . chlorproMAZINE (THORAZINE) 200 MG tablet Take 0.5 tablets (100 mg total) by mouth at bedtime. (Patient taking differently: Take 50-100 mg by mouth 2 (two) times daily.  daily at bedtime and  BID)  . divalproex (DEPAKOTE ER) 500 MG 24 hr tablet Take 1,250 mg by mouth at bedtime. For mood disorder  . lamoTRIgine (LAMICTAL) 100 MG tablet Take 100 mg by mouth 2 (two) times daily. For mood disorder  . levothyroxine (SYNTHROID, LEVOTHROID) 150 MCG tablet Take 150 mcg by mouth every  morning. For thyroid disease.  Marland Kitchen LORazepam (ATIVAN) 0.5 MG tablet Take one tablet by mouth three times daily as needed for anxiety  . LORazepam (ATIVAN) 2 MG/ML concentrated solution Take 1 mg by mouth 2 (two) times daily as needed for anxiety.  . magnesium hydroxide (MILK OF MAGNESIA) 400 MG/5ML suspension Take 30 mLs by mouth daily as needed for mild constipation.  . Multiple Vitamin (MULTIVITAMIN) tablet Take 1 tablet by mouth daily. For supplement  . omeprazole (PRILOSEC) 20 MG capsule Take 40 mg by mouth every morning. For reflux      SIGNIFICANT DIAGNOSTIC EXAMS  She is not a candidate for a mammogram or colonoscopy or dexa scan    05-10-14: chest x-ray: modest cardiomegaly with clear lungs    LABS REVIEWED:   04-22-14: wbc 6.2; hgb 12.6; hct 38.3; mcv 103.1; plt 216 glucose 78; bun 18.5;  creat 0.61; k+4.4; na++137; liver normal albumin 3.4; tsh 3.89  depakote 57  04-29-14: vit b12: 424; folic 15.0 07-03-14 hgb a1c 4.7 07-08-14: stool for guaiac: neg  07-31-14: wbc 4.8; hgb 12.3; hct 41.1; mcv 106.6; plt 198 ;glucose 108; bun 9.3; creat 0.56; k+4.6; na++141; liver normal albumin 3.6  10-15-14: tsh 3.63; depakote 49       Review of Systems  Unable to perform ROS: medical condition     Physical Exam Constitutional: No distress.  Morbid obesity   Neck: Neck supple. No JVD present.  Cardiovascular: Normal rate, regular rhythm and intact distal pulses.   Respiratory: Effort normal and breath sounds normal. No respiratory distress. She has no wheezes.  GI: Soft. Bowel sounds are normal. She exhibits no distension. There is no tenderness.  Musculoskeletal: She exhibits no edema.  Is able to move extremities   Neurological: She is alert.  Skin: Skin is warm and dry. She is not diaphoretic.    ASSESSMENT/ PLAN:  1.  Hypothyroidism: is stable her tsh is 3.63; will continue synthroid 150 mcg daily   2. Afib: heart rate is stable will continue asa 81 mg daily  Will monitor her status.   3. COPD: is stable will continue pulmicort neb treatment twice daily is 02 dependent   4. Gerd: will continue prilosec 40 mg daily   5. Schizophrenia: she is without change in status; she does talk to herself; will continue thorazine 50 mg twice daily and 100 mg nightly; depakote 1250 mg daily; and lamictal 100 mg twice daily to stabilize mood. Will continue ativan 0.5 mg three times daily as needed for anxiety has ativan gel 0.5 mg twice daily as needed and will monitor  Is followed by psych services   6. Diabetes: she is presently not on medications; will not make changes will monitor her status. Her hgb a1c is 4.7    Will check hgb a1c and urine for micro-albumin    Synthia Innocent NP [facility]  Contact [phone] Monday through Friday 8am- 5pm  After hours call  [phone]

## 2015-01-01 ENCOUNTER — Encounter: Payer: Self-pay | Admitting: Adult Health

## 2015-01-01 ENCOUNTER — Non-Acute Institutional Stay (SKILLED_NURSING_FACILITY): Payer: Medicare Other | Admitting: Adult Health

## 2015-01-01 DIAGNOSIS — J9612 Chronic respiratory failure with hypercapnia: Secondary | ICD-10-CM | POA: Diagnosis not present

## 2015-01-01 DIAGNOSIS — F250 Schizoaffective disorder, bipolar type: Secondary | ICD-10-CM | POA: Diagnosis not present

## 2015-01-01 DIAGNOSIS — K219 Gastro-esophageal reflux disease without esophagitis: Secondary | ICD-10-CM | POA: Diagnosis not present

## 2015-01-01 DIAGNOSIS — I110 Hypertensive heart disease with heart failure: Secondary | ICD-10-CM | POA: Insufficient documentation

## 2015-01-01 DIAGNOSIS — E119 Type 2 diabetes mellitus without complications: Secondary | ICD-10-CM

## 2015-01-01 DIAGNOSIS — I119 Hypertensive heart disease without heart failure: Secondary | ICD-10-CM | POA: Diagnosis not present

## 2015-01-01 DIAGNOSIS — D649 Anemia, unspecified: Secondary | ICD-10-CM

## 2015-01-01 DIAGNOSIS — I482 Chronic atrial fibrillation, unspecified: Secondary | ICD-10-CM

## 2015-01-01 DIAGNOSIS — J449 Chronic obstructive pulmonary disease, unspecified: Secondary | ICD-10-CM

## 2015-01-01 DIAGNOSIS — E039 Hypothyroidism, unspecified: Secondary | ICD-10-CM | POA: Diagnosis not present

## 2015-01-01 NOTE — Progress Notes (Signed)
Patient ID: [name], female   DOB: [birth year], 64 y.o.   MRN: [id number]  [facility]     Allergies  Allergen Reactions  . Food Rash    "lasagna"= rash from the noodles  . Penicillins Hives  . Sulfamethoxazole-Trimethoprim Hives and Nausea And Vomiting       Chief Complaint  Patient presents with  . Medical Management of Chronic Issues    HPI:  She is a long term resident of this facility being seen for the management of her chronic illnesses. Overall her status is stable. She is unable to participate fully in the hpi or ros. There are no nursing concerns at this time.  She was unable to participate to obtain a urine for micro-albumin   Past Medical History  Diagnosis Date  . Schizoaffective disorder   . Urinary tract infection   . Hypertension   . GERD (gastroesophageal reflux disease)   . Hypothyroidism   . Obesity   . Asthma   . COPD (chronic obstructive pulmonary disease)   . CHF (congestive heart failure)   . Seizures   . Morbid obesity   . Chronic pain   . Hypercholesteremia   . Anginal pain   . Myocardial infarction 2002  . Diabetes mellitus     "I'm not diabetic anymore" (05/23/2012)  . Anemia   . History of blood transfusion 1993    "when I had my hysterectomy" (05/23/2012)  . H/O hiatal hernia   . Daily headache   . Arthritis     "severe; all over my body" (05/23/2012)  . Hypoventilation syndrome     [name] 05/30/2012  . Atrial fibrillation   . Atrial flutter, paroxysmal     [name] 05/30/2012  . Exertional dyspnea   . Shortness of breath     "all the time lately" (05/30/2012)  . Pneumonia     "several times; including now" (04/23/2013)  . Anxiety   . Episodic mood disorder   . Psychosis   . Schizophrenia   . 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 CHOLELITHIASIS 11/30/2009    Qualifier: Diagnosis of  By: Brynda Rim      Past Surgical History  Procedure Laterality Date  .  Tubal ligation  1998  . Cholecystectomy  ?2008  . Cardiac catheterization  2008  . Vaginal hysterectomy  1993    VITAL SIGNS BP 124/72 mmHg  Pulse 83  Ht  (1.6 m)  Wt 332 lb (150.594 kg)  BMI 58.83 kg/m2  SpO2 96%   Outpatient Encounter Prescriptions as of 01/01/2015  Medication Sig  . aspirin EC 81 MG tablet Take 81 mg by mouth every morning. For atril flutter  . bisacodyl (DULCOLAX) 10 MG suppository Place 10 mg rectally every 3 (three) days as needed for mild constipation or moderate constipation.  . chlorproMAZINE (THORAZINE) 200 MG tablet Take 0.5 tablets (100 mg total) by mouth at bedtime. (Patient taking differently: Take 50-125 mg by mouth 2 (two) times daily. 125 mg daily at bedtime AND  BID)  . divalproex (DEPAKOTE ER) 500 MG 24 hr tablet Take 1,250 mg by mouth at bedtime. For mood disorder  . lamoTRIgine (LAMICTAL) 100 MG tablet Take 100 mg by mouth 2 (two) times daily. For mood disorder  . levothyroxine (SYNTHROID, LEVOTHROID) 150 MCG tablet Take 150 mcg by mouth every morning. For thyroid disease.  Marland Kitchen LORazepam (ATIVAN) 0.5 MG tablet Take one tablet  by mouth three times daily as needed for anxiety  . LORazepam (ATIVAN) 2 MG/ML concentrated solution Take 1 mg by mouth 2 (two) times daily as needed for anxiety.  . magnesium hydroxide (MILK OF MAGNESIA) 400 MG/5ML suspension Take 30 mLs by mouth daily as needed for mild constipation.  . Multiple Vitamin (MULTIVITAMIN) tablet Take 1 tablet by mouth daily. For supplement  . omeprazole (PRILOSEC) 20 MG capsule Take 40 mg by mouth every morning. For reflux      SIGNIFICANT DIAGNOSTIC EXAMS  She is not a candidate for a mammogram or colonoscopy or dexa scan    05-10-14: chest x-ray: modest cardiomegaly with clear lungs    LABS REVIEWED:   04-22-14: wbc 6.2; hgb 12.6; hct 38.3; mcv 103.1; plt 216 glucose 78; bun 18.5; creat 0.61; k+4.4; na++137; liver normal albumin 3.4; tsh 3.89  depakote 57  04-29-14: vit b12:  424; folic 15.0 07-03-14 hgb a1c 4.7 07-08-14: stool for guaiac: neg  07-31-14: wbc 4.8; hgb 12.3; hct 41.1; mcv 106.6; plt 198 ;glucose 108; bun 9.3; creat 0.56; k+4.6; na++141; liver normal albumin 3.6  10-15-14: tsh 3.63; depakote 49      ROS Unable to perform ROS: medical condition     Physical Exam Constitutional: No distress.  Morbid obesity   Neck: Neck supple. No JVD present.  Cardiovascular: Normal rate, regular rhythm and intact distal pulses.   Respiratory: Effort normal and breath sounds normal. No respiratory distress. She has no wheezes.  GI: Soft. Bowel sounds are normal. She exhibits no distension. There is no tenderness.  Musculoskeletal: She exhibits no edema.  Is able to move extremities   Neurological: She is alert.  Skin: Skin is warm and dry. She is not diaphoretic.    ASSESSMENT/ PLAN:  1.  Hypothyroidism: is stable her tsh is 3.63; will continue synthroid 150 mcg daily   2. Afib: heart rate is stable will continue asa 81 mg daily  Will monitor her status.   3. COPD with chronic respiratory failure:  is stable is 02 dependent; she is unable to use inhalers; and is unable to utilize neb treatments; will monitor   4. Gerd: will continue prilosec 40 mg daily   5. Schizophrenia: she is without change in status; she does talk to herself; will continue thorazine 50 mg twice daily and 125 mg nightly; depakote 1250 mg daily; and lamictal 100 mg twice daily to stabilize mood. Will continue ativan 0.5 mg three times daily as needed for anxiety has ativan gel 1 mg twice daily as needed and will monitor  Is followed by psych services   6. Diabetes: she is presently not on medications; will not make changes will monitor her status. Her hgb a1c is 4.7   7. Anemia: her hgb is 12.6; does not take iron supplements; will monitor   Will stop her mvi at this time  Synthia Innocent NP [facility]  Contact [phone] Monday through Friday 8am- 5pm  After  hours call [phone]

## 2015-02-03 ENCOUNTER — Encounter: Payer: Self-pay | Admitting: Adult Health

## 2015-02-03 ENCOUNTER — Non-Acute Institutional Stay (SKILLED_NURSING_FACILITY): Payer: Medicare Other | Admitting: Adult Health

## 2015-02-03 DIAGNOSIS — E119 Type 2 diabetes mellitus without complications: Secondary | ICD-10-CM | POA: Diagnosis not present

## 2015-02-03 DIAGNOSIS — E039 Hypothyroidism, unspecified: Secondary | ICD-10-CM

## 2015-02-03 DIAGNOSIS — I119 Hypertensive heart disease without heart failure: Secondary | ICD-10-CM

## 2015-02-03 DIAGNOSIS — I482 Chronic atrial fibrillation, unspecified: Secondary | ICD-10-CM

## 2015-02-03 DIAGNOSIS — K219 Gastro-esophageal reflux disease without esophagitis: Secondary | ICD-10-CM

## 2015-02-03 DIAGNOSIS — J9612 Chronic respiratory failure with hypercapnia: Secondary | ICD-10-CM | POA: Diagnosis not present

## 2015-02-03 DIAGNOSIS — F250 Schizoaffective disorder, bipolar type: Secondary | ICD-10-CM

## 2015-02-03 DIAGNOSIS — J449 Chronic obstructive pulmonary disease, unspecified: Secondary | ICD-10-CM

## 2015-02-03 NOTE — Progress Notes (Signed)
Patient ID: [name], female   DOB: 02/25/1951, 64 y.o.   MRN: [id number]    Facility: [name]      Allergies  Allergen Reactions  . Food Rash    "lasagna"= rash from the noodles  . Penicillins Hives  . Sulfamethoxazole-Trimethoprim Hives and Nausea And Vomiting    Chief Complaint  Patient presents with  . Medical Management of Chronic Issues    HPI:  She is a long term resident of this facility being seen for the management of her chronic illnesses. Overall there is little change in her status.  She is unable to fully participate in the hpi or ros. There are no  Nursing concerns at this time.    Past Medical History  Diagnosis Date  . Schizoaffective disorder   . Urinary tract infection   . Hypertension   . GERD (gastroesophageal reflux disease)   . Hypothyroidism   . Obesity   . Asthma   . COPD (chronic obstructive pulmonary disease)   . CHF (congestive heart failure)   . Seizures   . Morbid obesity   . Chronic pain   . Hypercholesteremia   . Anginal pain   . Myocardial infarction 2002  . Diabetes mellitus     "I'm not diabetic anymore" (05/23/2012)  . Anemia   . History of blood transfusion 1993    "when I had my hysterectomy" (05/23/2012)  . H/O hiatal hernia   . Daily headache   . Arthritis     "severe; all over my body" (05/23/2012)  . Hypoventilation syndrome     [name] 05/30/2012  . Atrial fibrillation   . Atrial flutter, paroxysmal     [name] 05/30/2012  . Exertional dyspnea   . Shortness of breath     "all the time lately" (05/30/2012)  . Pneumonia     "several times; including now" (04/23/2013)  . Anxiety   . Episodic mood disorder   . Psychosis   . Schizophrenia   . 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 CHOLELITHIASIS 11/30/2009    Qualifier: Diagnosis of  By: Brynda Rim      Past Surgical History  Procedure Laterality Date  . Tubal ligation  1998  .  Cholecystectomy  ?2008  . Cardiac catheterization  2008  . Vaginal hysterectomy  1993    VITAL SIGNS BP 114/67 mmHg  Pulse 76  Ht  (1.6 m)  Wt 381 lb (172.82 kg)  BMI 67.51 kg/m2  SpO2 95%  Patient's Medications  New Prescriptions   No medications on file  Previous Medications   ASPIRIN EC 81 MG TABLET    Take 81 mg by mouth every morning. For atril flutter   BISACODYL (DULCOLAX) 10 MG SUPPOSITORY    Place 10 mg rectally every 3 (three) days as needed for mild constipation or moderate constipation.   CHLORPROMAZINE (THORAZINE) 200 MG TABLET    Take 0.5 tablets (100 mg total) by mouth at bedtime.   DIVALPROEX (DEPAKOTE ER) 500 MG 24 HR TABLET    Take 1,250 mg by mouth at bedtime. For mood disorder   LAMOTRIGINE (LAMICTAL) 100 MG TABLET    Take 100 mg by mouth 2 (two) times daily. For mood disorder   LEVOTHYROXINE (SYNTHROID, LEVOTHROID) 150 MCG TABLET    Take 150 mcg by mouth every morning. For thyroid disease.   LORAZEPAM (ATIVAN) 0.5 MG TABLET    Take  one tablet by mouth three times daily as needed for anxiety   LORAZEPAM (ATIVAN) 2 MG/ML CONCENTRATED SOLUTION    Take 1 mg by mouth 2 (two) times daily as needed for anxiety.   MAGNESIUM HYDROXIDE (MILK OF MAGNESIA) 400 MG/5ML SUSPENSION    Take 30 mLs by mouth daily as needed for mild constipation.   OMEPRAZOLE (PRILOSEC) 20 MG CAPSULE    Take 40 mg by mouth every morning. For reflux  Modified Medications   No medications on file  Discontinued Medications   MULTIPLE VITAMIN (MULTIVITAMIN) TABLET    Take 1 tablet by mouth daily. For supplement     SIGNIFICANT DIAGNOSTIC EXAMS   She is not a candidate for a mammogram or colonoscopy or dexa scan    05-10-14: chest x-ray: modest cardiomegaly with clear lungs    LABS REVIEWED:   04-22-14: wbc 6.2; hgb 12.6; hct 38.3; mcv 103.1; plt 216 glucose 78; bun 18.5; creat 0.61; k+4.4; na++137; liver normal albumin 3.4; tsh 3.89  depakote 57  04-29-14: vit b12: 424; folic  15.0 07-03-14 hgb a1c 4.7 07-08-14: stool for guaiac: neg  07-31-14: wbc 4.8; hgb 12.3; hct 41.1; mcv 106.6; plt 198 ;glucose 108; bun 9.3; creat 0.56; k+4.6; na++141; liver normal albumin 3.6  10-15-14: tsh 3.63; depakote 49      Review of Systems Unable to perform ROS: medical condition    Physical Exam  Constitutional: No distress.  Morbid obesity   Eyes: Conjunctivae are normal.  Neck: Neck supple. No JVD present. No thyromegaly present.  Cardiovascular: Normal rate, regular rhythm and intact distal pulses.   Respiratory: Effort normal and breath sounds normal. No respiratory distress. She has no wheezes.  GI: Soft. Bowel sounds are normal. She exhibits no distension. There is no tenderness.  Musculoskeletal: She exhibits no edema.  Able to move all extremities   Lymphadenopathy:    She has no cervical adenopathy.  Neurological: She is alert.  Skin: Skin is warm and dry. She is not diaphoretic.  Psychiatric: She has a normal mood and affect.       ASSESSMENT/ PLAN:  1.  Hypothyroidism: is stable her tsh is 3.63; will continue synthroid 150 mcg daily   2. Afib: heart rate is stable will continue asa 81 mg daily  Will monitor her status.   3. COPD with chronic respiratory failure:  is stable is 02 dependent; she is unable to use inhalers; and is unable to utilize neb treatments; will monitor   4. Gerd: will continue prilosec 40 mg daily   5. Schizophrenia: she is without change in status; she does talk to herself; will continue thorazine 50 mg twice daily and 125 mg nightly; depakote 1250 mg daily; and lamictal 100 mg twice daily to stabilize mood. Will continue ativan 0.5 mg three times daily as needed for anxiety has ativan gel 1 mg twice daily as needed and will monitor  Is followed by psych services   6. Diabetes: she is presently not on medications; will not make changes will monitor her status. Her hgb a1c is 4.7 ; she is unable to participate with urine for  micro-albumin   7. Anemia: her hgb is 12.6; does not take iron supplements; will monitor     Will check cbc; cmp; lipids; hgb a1c;     Synthia Innocent NP [facility]  Contact [phone] Monday through Friday 8am- 5pm  After hours call [phone]

## 2015-02-12 ENCOUNTER — Encounter: Payer: Self-pay | Admitting: Adult Health

## 2015-03-05 IMAGING — CT CT HEAD W/O CM
4 of 6 series · 15 of 30 positions shown, 16 images · non-contrast
Comparison: Prior CT from 07/28/2012

CLINICAL DATA: Pain

EXAM:
CT HEAD WITHOUT CONTRAST
TECHNIQUE: Contiguous axial images were obtained from the base of the skull
through the vertex without intravenous contrast.

[Series 2: head w/o · axial · non-contrast · 0.42mm/px · z∈[+1297,+1322]mm · 2 of 15 slices shown (1 of 2)]
[im 5/15  brain]
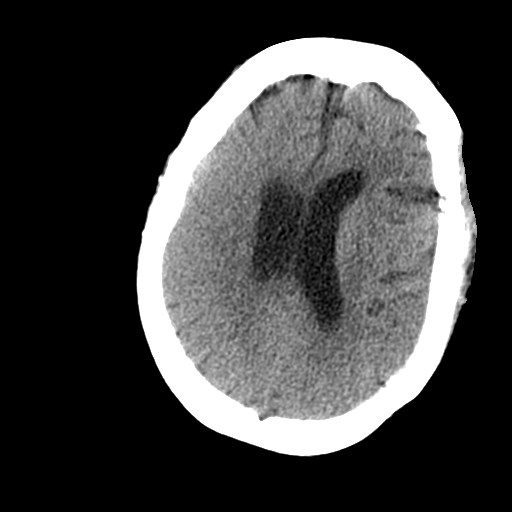
[im 10/15  brain]
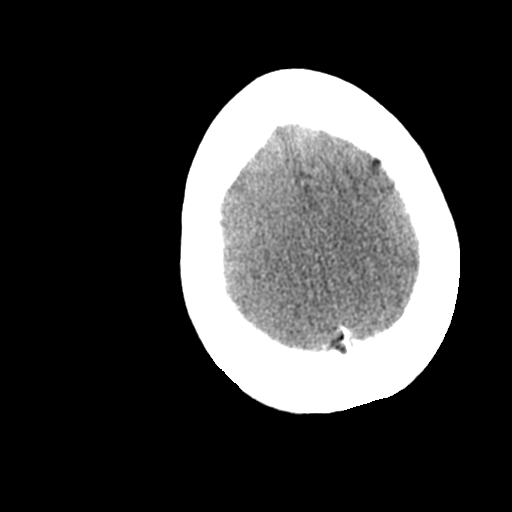

[Series 5: bone windows · axial · 0.42mm/px · z∈[+1282,+1327]mm · 3 of 31 slices shown (1 of 2)]
[im 8/31  bone]
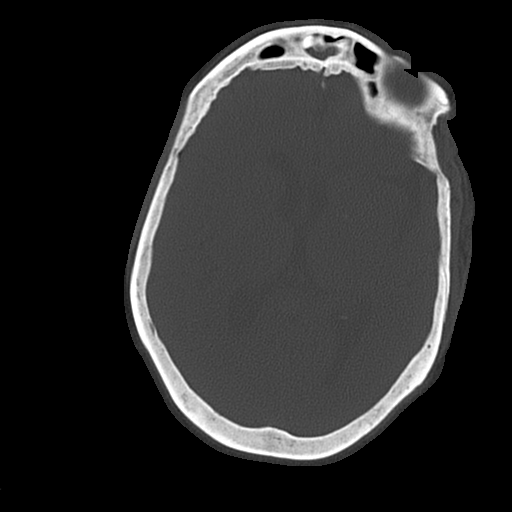
[im 16/31  bone]
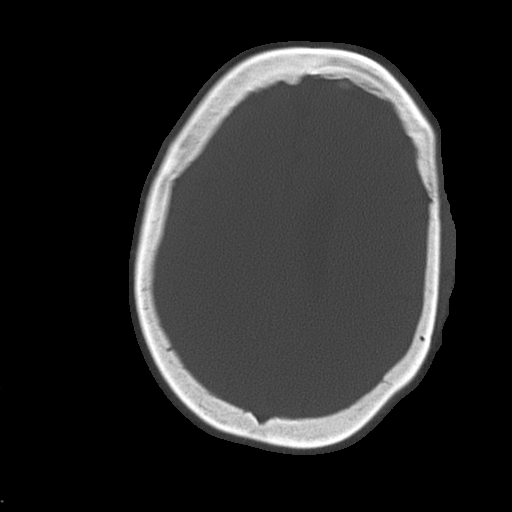
[im 23/31  bone]
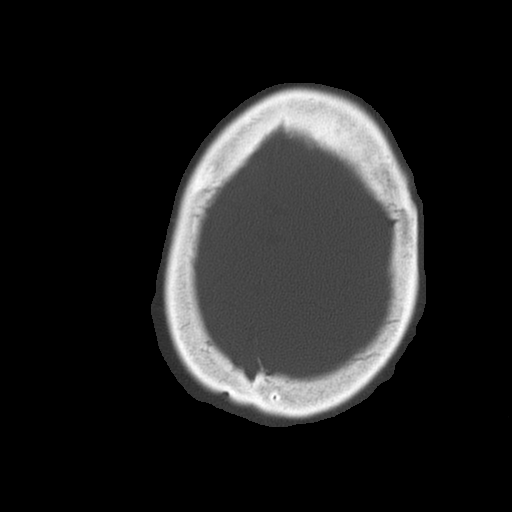

[Series 6: head w/o · axial · non-contrast · 0.42mm/px · z∈[+1237,+1307]mm · 3 of 30 slices shown, 4 images (2 of 2)]
[im 8/30  brain]
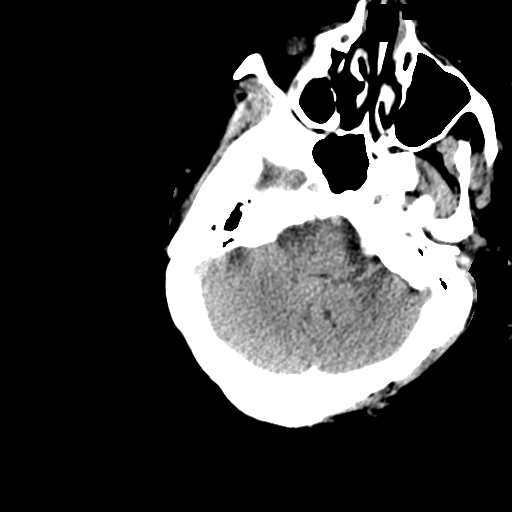
[im 8/30  bone]
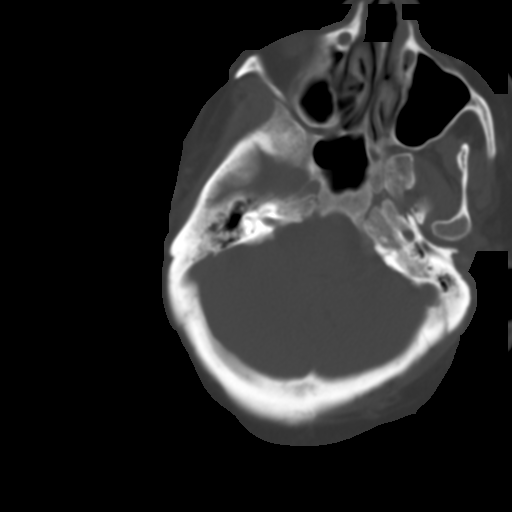
[im 15/30  brain]
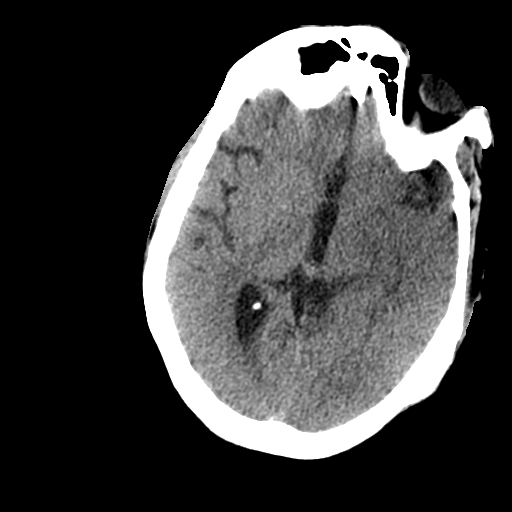
[im 22/30  brain]
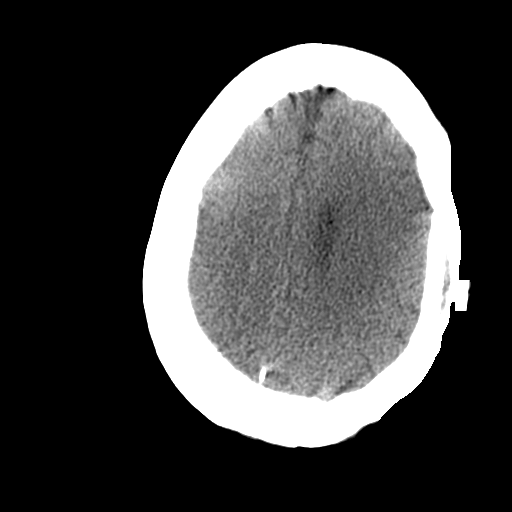

[Series 7: bone windows · axial · 0.42mm/px · z∈[+1220,+1328]mm · 7 of 50 slices shown (2 of 2)]
[im 7/50  bone]
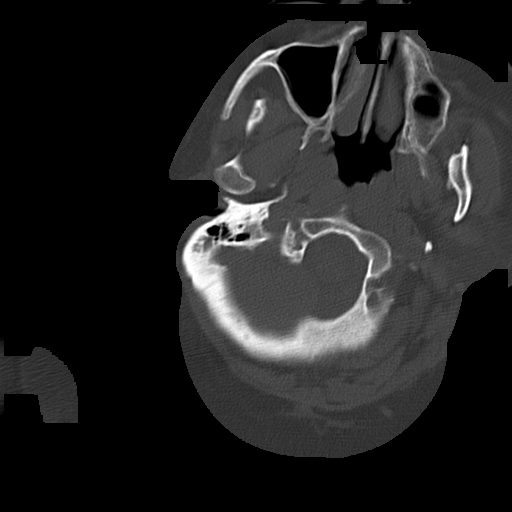
[im 13/50  bone]
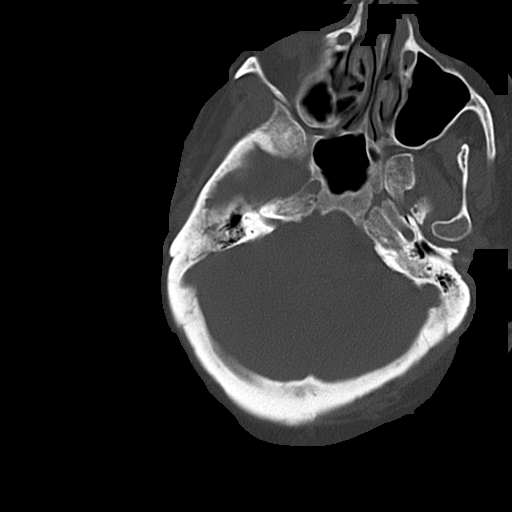
[im 19/50  bone]
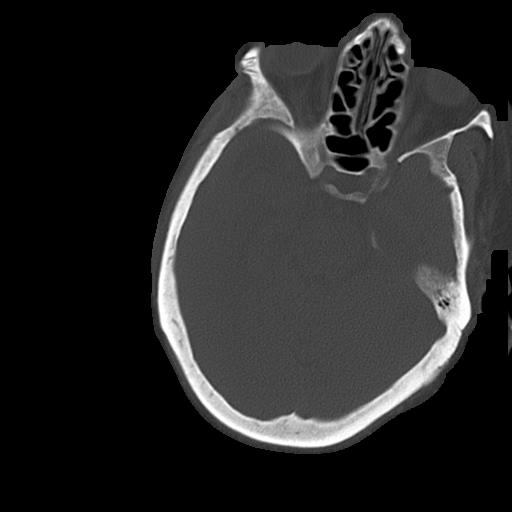
[im 25/50  bone]
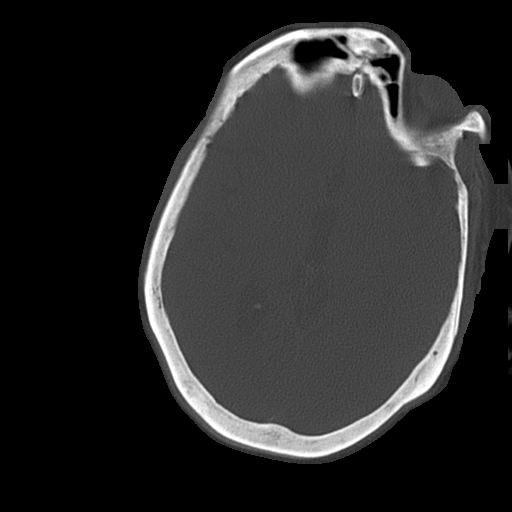
[im 31/50  bone]
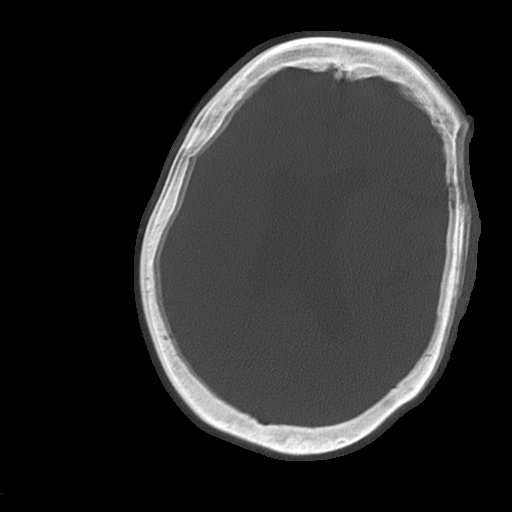
[im 37/50  bone]
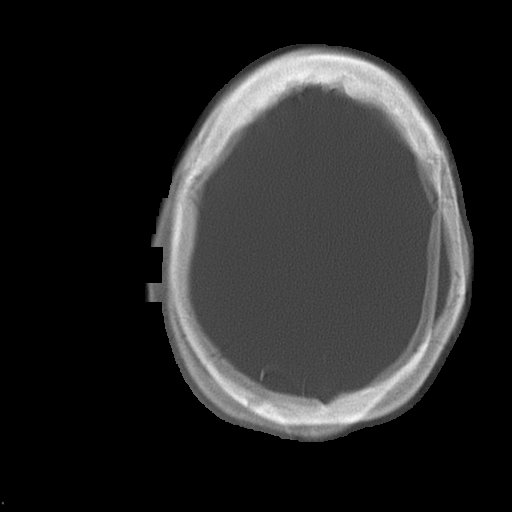
[im 43/50  bone]
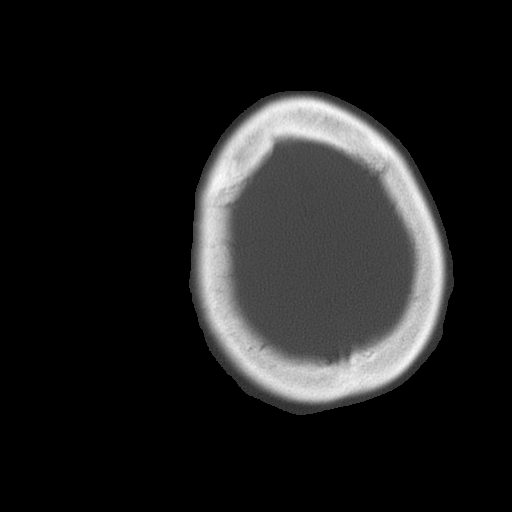

[15 of 30 positions shown; findings below may reference images not displayed]

FINDINGS: Study is degraded by motion artifact.

There is no acute intracranial hemorrhage or infarct. No mass lesion
or midline shift. Gray-white matter differentiation is well
maintained. Ventricles are normal in size without evidence of
hydrocephalus. CSF containing spaces are within normal limits. No
extra-axial fluid collection.

The calvarium is intact.

Orbital soft tissues are within normal limits.

The paranasal sinuses and mastoid air cells are well pneumatized and
free of fluid.

Scalp soft tissues are unremarkable.
IMPRESSION: No acute intracranial abnormality.

## 2015-03-23 ENCOUNTER — Non-Acute Institutional Stay (SKILLED_NURSING_FACILITY): Payer: Medicare Other | Admitting: Internal Medicine

## 2015-03-23 DIAGNOSIS — I10 Essential (primary) hypertension: Secondary | ICD-10-CM | POA: Diagnosis not present

## 2015-03-23 DIAGNOSIS — I480 Paroxysmal atrial fibrillation: Secondary | ICD-10-CM

## 2015-03-23 DIAGNOSIS — D649 Anemia, unspecified: Secondary | ICD-10-CM | POA: Diagnosis not present

## 2015-03-23 DIAGNOSIS — R600 Localized edema: Secondary | ICD-10-CM | POA: Diagnosis not present

## 2015-03-23 DIAGNOSIS — F250 Schizoaffective disorder, bipolar type: Secondary | ICD-10-CM

## 2015-03-23 DIAGNOSIS — E039 Hypothyroidism, unspecified: Secondary | ICD-10-CM | POA: Diagnosis not present

## 2015-03-23 DIAGNOSIS — J449 Chronic obstructive pulmonary disease, unspecified: Secondary | ICD-10-CM | POA: Diagnosis not present

## 2015-03-23 DIAGNOSIS — E119 Type 2 diabetes mellitus without complications: Secondary | ICD-10-CM | POA: Diagnosis not present

## 2015-03-26 ENCOUNTER — Encounter: Payer: Self-pay | Admitting: Internal Medicine

## 2015-04-22 ENCOUNTER — Encounter: Payer: Self-pay | Admitting: Adult Health

## 2015-04-22 ENCOUNTER — Non-Acute Institutional Stay (SKILLED_NURSING_FACILITY): Payer: Medicare Other | Admitting: Adult Health

## 2015-04-22 DIAGNOSIS — I119 Hypertensive heart disease without heart failure: Secondary | ICD-10-CM

## 2015-04-22 DIAGNOSIS — E039 Hypothyroidism, unspecified: Secondary | ICD-10-CM

## 2015-04-22 DIAGNOSIS — K219 Gastro-esophageal reflux disease without esophagitis: Secondary | ICD-10-CM

## 2015-04-22 DIAGNOSIS — I509 Heart failure, unspecified: Secondary | ICD-10-CM | POA: Insufficient documentation

## 2015-04-22 DIAGNOSIS — I482 Chronic atrial fibrillation, unspecified: Secondary | ICD-10-CM

## 2015-04-22 DIAGNOSIS — L03311 Cellulitis of abdominal wall: Secondary | ICD-10-CM

## 2015-04-22 DIAGNOSIS — F250 Schizoaffective disorder, bipolar type: Secondary | ICD-10-CM | POA: Diagnosis not present

## 2015-04-22 MED ORDER — FUROSEMIDE 20 MG PO TABS: 20.0000 mg | ORAL_TABLET | Freq: Every day | ORAL | Status: DC

## 2015-04-22 MED ORDER — LISINOPRIL 5 MG PO TABS: 5.0000 mg | ORAL_TABLET | Freq: Every day | ORAL | Status: DC

## 2015-04-22 NOTE — Progress Notes (Signed)
Patient ID: [name], female   DOB: [birth year], 65 y.o.   MRN: [id number]    Facility: [name]      Allergies  Allergen Reactions  . Food Rash    "lasagna"= rash from the noodles  . Penicillins Hives  . Penicillins     Per MAR  . Sulfa Antibiotics     Per MAR  . Sulfamethoxazole-Trimethoprim Hives and Nausea And Vomiting    Chief Complaint  Patient presents with  . Medical Management of Chronic Issues  . Acute Visit    patient status     HPI:  She is a long term resident of this facility being seen for the management of her chronic illnesses. She has on her flank area abrasion areas which are inflamed and hot to touch; the wound beds are yellow; there is no drainage present. She blood pressure is elevated and will require treatment. Her chest x-ray demonstrates mild CHF. She does not have a history of chf.    Past Medical History  Diagnosis Date  . Bipolar disorder (HCC)   . Schizoaffective disorder (HCC)   . Mood disorder (HCC)   . Epilepsy (HCC)   . Thyroid disease   . Hiatal hernia   . Atrial flutter (HCC)   . Schizoaffective disorder   . Urinary tract infection   . Hypertension   . GERD (gastroesophageal reflux disease)   . Hypothyroidism   . Obesity   . Asthma   . COPD (chronic obstructive pulmonary disease) (HCC)   . CHF (congestive heart failure) (HCC)   . Seizures (HCC)   . Morbid obesity (HCC)   . Chronic pain   . Hypercholesteremia   . Anginal pain (HCC)   . Myocardial infarction (HCC) 2002  . Diabetes mellitus     "I'm not diabetic anymore" (05/23/2012)  . Anemia   . History of blood transfusion 1993    "when I had my hysterectomy" (05/23/2012)  . H/O hiatal hernia   . Daily headache   . Arthritis     "severe; all over my body" (05/23/2012)  . Hypoventilation syndrome     [name] 05/30/2012  . Atrial fibrillation (HCC)   . Atrial flutter, paroxysmal (HCC)     [name] 05/30/2012  . Exertional dyspnea   . Shortness of  breath     "all the time lately" (05/30/2012)  . Pneumonia     "several times; including now" (04/23/2013)  . Anxiety   . Episodic mood disorder (HCC)   . Psychosis   . Schizophrenia (HCC)   . 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 CHOLELITHIASIS 11/30/2009    Qualifier: Diagnosis of  By: Brynda Rim      Past Surgical History  Procedure Laterality Date  . Tubal ligation  1998  . Cholecystectomy  ?2008  . Cardiac catheterization  2008  . Vaginal hysterectomy  1993    VITAL SIGNS BP 166/99 mmHg  Pulse 94  Ht  (1.6 m)  Wt 386 lb (175.088 kg)  BMI 68.39 kg/m2  SpO2 96%  Patient's Medications  New Prescriptions   No medications on file  Previous Medications   ASPIRIN 81 MG CHEWABLE TABLET    Chew 81 mg by mouth daily.   BISACODYL (DULCOLAX) 10 MG SUPPOSITORY    Place 10 mg rectally every 3 (three) days as needed for mild constipation or moderate constipation.   CHLORPROMAZINE (THORAZINE)  200 MG TABLET    Take 0.5 tablets (100 mg total) by mouth at bedtime.   DIVALPROEX (DEPAKOTE ER) 250 MG 24 HR TABLET    Take 1,250 mg by mouth at bedtime.   LAMOTRIGINE (LAMICTAL) 100 MG TABLET    Take 100 mg by mouth 2 (two) times daily. For mood disorder   LEVOTHYROXINE (SYNTHROID, LEVOTHROID) 150 MCG TABLET    Take 150 mcg by mouth every morning. For thyroid disease.   LORAZEPAM (ATIVAN) 0.5 MG TABLET    Take one tablet by mouth three times daily as needed for anxiety   LORAZEPAM (ATIVAN) 2 MG/ML CONCENTRATED SOLUTION    Take 1 mg by mouth 2 (two) times daily as needed for anxiety.   LURASIDONE (LATUDA) 40 MG TABS TABLET    Take 20 mg by mouth daily with breakfast.   MAGNESIUM HYDROXIDE (MILK OF MAGNESIA) 400 MG/5ML SUSPENSION    Take 30 mLs by mouth daily as needed for mild constipation.   MULTIPLE VITAMIN (MULTIVITAMIN WITH MINERALS) TABS TABLET    Take 1 tablet by mouth daily.   OMEPRAZOLE (PRILOSEC) 40 MG CAPSULE    Take  40 mg by mouth daily.  Modified Medications   No medications on file  Discontinued Medications     SIGNIFICANT DIAGNOSTIC EXAMS    She is not a candidate for a mammogram or colonoscopy or dexa scan    05-10-14: chest x-ray: modest cardiomegaly with clear lungs   04-21-15: chest x-ray: cardiomegaly with mild chf.    LABS REVIEWED:   07-03-14 hgb a1c 4.7 07-08-14: stool for guaiac: neg  07-31-14: wbc 4.8; hgb 12.3; hct 41.1; mcv 106.6; plt 198 ;glucose 108; bun 9.3; creat 0.56; k+4.6; na++141; liver normal albumin 3.6  10-15-14: tsh 3.63; depakote 49  03-27-15: glucose 106; bun 7.3; creat 0.61; k+ 4.2; na++140 04-22-15: wbc 5.7 ;hgb 11.3; hct 39.6; mcv 103.5; plt 205; glucose 103; bun 7.1; creat 0.72; k+ 4.9; na++139; liver normal albumin 3.2     Review of Systems Unable to perform ROS: medical condition     Physical Exam Constitutional: No distress.  Morbid obesity   Eyes: Conjunctivae are normal.  Neck: Neck supple. No JVD present. No thyromegaly present.  Cardiovascular: Normal rate, regular rhythm and intact distal pulses.   Respiratory: Effort normal  No respiratory distress. She has no wheezes. breath sounds diminished GI: Soft. Bowel sounds are normal. She exhibits no distension. There is no tenderness.  Musculoskeletal: She exhibits no edema.  Able to move all extremities   Lymphadenopathy:    She has no cervical adenopathy.  Neurological: She is alert.  Skin: Skin is warm and dry. She is not diaphoretic.  Flank abrasion areas: with yellow slough present in wound beds; surrounding area is red hot and inflamed. No drainage present  Psychiatric: She has a normal mood and affect.       ASSESSMENT/ PLAN:  1.  Hypothyroidism: is stable her tsh is 3.63; will continue synthroid 150 mcg daily   2. Afib: heart rate is stable will continue asa 81 mg daily  Will monitor her status.   3. COPD with chronic respiratory failure:  is stable is 02 dependent; she is unable  to use inhalers; and is unable to utilize neb treatments; will monitor   4. Gerd: will continue prilosec 40 mg daily   5. Schizophrenia: she is without change in status; she does talk to herself; will continue thorazine 50 mg twice daily and 125 mg nightly; depakote 1250  mg daily; and lamictal 100 mg twice daily to stabilize mood. Will continue ativan 0.5 mg three times daily as needed for anxiety has ativan gel 1 mg twice daily as needed and will monitor  Is followed by psych services   6. Diabetes: she is presently not on medications; will not make changes will monitor her status. Her hgb a1c is 4.7 ; she is unable to participate with urine for micro-albumin   7. Anemia: her hgb is 12.6; does not take iron supplements; will monitor   8. Cellulitis left flank: will extend doxycycline 100 mg twice daily for a total of 4 weeks with florastor twice daily for 4 weeks.   9. Hypertension: will begin her on lisinopril 5 mg daily will check bmp in 2 weeks.   10. CHF; unspecified type per chest x-ray on 04-21-15: will begin her on lasix 20 mg daily will check BNP in the am and will check a 2-d echo. For her right lower extremity edema will attempt again for a doppler study will have staff pre-med.    Time spent with patient  45  minutes >50% time spent counseling; reviewing medical record; tests; labs; and developing future plan of care   Synthia Innocent NP [facility]  Contact [phone] Monday through Friday 8am- 5pm  After hours call [phone]

## 2015-05-07 ENCOUNTER — Non-Acute Institutional Stay (SKILLED_NURSING_FACILITY): Payer: Medicare Other | Admitting: Adult Health

## 2015-05-07 ENCOUNTER — Encounter: Payer: Self-pay | Admitting: Adult Health

## 2015-05-07 DIAGNOSIS — I509 Heart failure, unspecified: Secondary | ICD-10-CM | POA: Diagnosis not present

## 2015-05-07 DIAGNOSIS — F250 Schizoaffective disorder, bipolar type: Secondary | ICD-10-CM

## 2015-05-07 MED ORDER — CHLORPROMAZINE HCL 25 MG PO TABS: 25.0000 mg | ORAL_TABLET | Freq: Three times a day (TID) | ORAL | Status: DC

## 2015-05-07 NOTE — Progress Notes (Signed)
Patient ID: [name], female   DOB: Oct 17, 1950, 64 y.o.   MRN: [id number]    Facility:  [facility]      Allergies  Allergen Reactions  . Food Rash    "lasagna"= rash from the noodles  . Penicillins Hives  . Penicillins     Per MAR  . Sulfa Antibiotics     Per MAR  . Sulfamethoxazole-Trimethoprim Hives and Nausea And Vomiting    Chief Complaint  Patient presents with  . Acute Visit    staff concerns     HPI:  Staff is concerns about her behaviors. They are not more frequent but are more intense. She has been throwing things at staff members; and it is more difficult to provide her personal care. She is unable to participate in the hpi or ros.   Past Medical History  Diagnosis Date  . Bipolar disorder (HCC)   . Schizoaffective disorder (HCC)   . Mood disorder (HCC)   . Epilepsy (HCC)   . Thyroid disease   . Hiatal hernia   . Atrial flutter (HCC)   . Schizoaffective disorder   . Urinary tract infection   . Hypertension   . GERD (gastroesophageal reflux disease)   . Hypothyroidism   . Obesity   . Asthma   . COPD (chronic obstructive pulmonary disease) (HCC)   . CHF (congestive heart failure) (HCC)   . Seizures (HCC)   . Morbid obesity (HCC)   . Chronic pain   . Hypercholesteremia   . Anginal pain (HCC)   . Myocardial infarction (HCC) 2002  . Diabetes mellitus     "I'm not diabetic anymore" (05/23/2012)  . Anemia   . History of blood transfusion 1993    "when I had my hysterectomy" (05/23/2012)  . H/O hiatal hernia   . Daily headache   . Arthritis     "severe; all over my body" (05/23/2012)  . Hypoventilation syndrome     [name] 05/30/2012  . Atrial fibrillation (HCC)   . Atrial flutter, paroxysmal (HCC)     [name] 05/30/2012  . Exertional dyspnea   . Shortness of breath     "all the time lately" (05/30/2012)  . Pneumonia     "several times; including now" (04/23/2013)  . Anxiety   . Episodic mood disorder (HCC)   . Psychosis   . Schizophrenia  (HCC)   . 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 CHOLELITHIASIS 11/30/2009    Qualifier: Diagnosis of  By: Brynda Rim      Past Surgical History  Procedure Laterality Date  . Tubal ligation  1998  . Cholecystectomy  ?2008  . Cardiac catheterization  2008  . Vaginal hysterectomy  1993    VITAL SIGNS BP 130/70 mmHg  Pulse 78  Ht  (1.6 m)  Wt 400 lb (181.439 kg)  BMI 70.87 kg/m2  SpO2 96%  Patient's Medications  New Prescriptions   No medications on file  Previous Medications   ASPIRIN 81 MG CHEWABLE TABLET    Chew 81 mg by mouth daily.   BISACODYL (DULCOLAX) 10 MG SUPPOSITORY    Place 10 mg rectally every 3 (three) days as needed for mild constipation or moderate constipation.   CHLORPROMAZINE (THORAZINE) 200 MG TABLET    25 mg at 12 pm 50 mg at 6 pm and 100 mg nightly    DIVALPROEX (DEPAKOTE ER) 250 MG 24 HR TABLET  Take 1,250 mg by mouth at bedtime.   FUROSEMIDE (LASIX) 20 MG TABLET    Take 1 tablet (20 mg total) by mouth daily.   LAMOTRIGINE (LAMICTAL) 100 MG TABLET    Take 100 mg by mouth 2 (two) times daily. For mood disorder   LEVOTHYROXINE (SYNTHROID, LEVOTHROID) 150 MCG TABLET    Take 150 mcg by mouth every morning. For thyroid disease.   LISINOPRIL (PRINIVIL,ZESTRIL) 5 MG TABLET    Take 1 tablet (5 mg total) by mouth daily.   LORAZEPAM (ATIVAN) 0.5 MG TABLET    Take one tablet by mouth three times daily as needed for anxiety   LORAZEPAM (ATIVAN) 2 MG/ML CONCENTRATED SOLUTION    Take 1 mg by mouth 2 (two) times daily as needed for anxiety.   LURASIDONE (LATUDA) 40 MG TABS TABLET    Take 20 mg by mouth daily with breakfast.   MAGNESIUM HYDROXIDE (MILK OF MAGNESIA) 400 MG/5ML SUSPENSION    Take 30 mLs by mouth daily as needed for mild constipation.   MULTIPLE VITAMIN (MULTIVITAMIN WITH MINERALS) TABS TABLET    Take 1 tablet by mouth daily.   OMEPRAZOLE (PRILOSEC) 40 MG CAPSULE    Take 40 mg by mouth  daily.  Modified Medications   No medications on file  Discontinued Medications   No medications on file     SIGNIFICANT DIAGNOSTIC EXAMS  She is not a candidate for a mammogram or colonoscopy or dexa scan She has declined 2-d echo and venous doppler    05-10-14: chest x-ray: modest cardiomegaly with clear lungs   04-21-15: chest x-ray: cardiomegaly with mild chf.    LABS REVIEWED:   07-03-14 hgb a1c 4.7 07-08-14: stool for guaiac: neg  07-31-14: wbc 4.8; hgb 12.3; hct 41.1; mcv 106.6; plt 198 ;glucose 108; bun 9.3; creat 0.56; k+4.6; na++141; liver normal albumin 3.6  10-15-14: tsh 3.63; depakote 49  03-27-15: glucose 106; bun 7.3; creat 0.61; k+ 4.2; na++140 04-22-15: wbc 5.7 ;hgb 11.3; hct 39.6; mcv 103.5; plt 205; glucose 103; bun 7.1; creat 0.72; k+ 4.9; na++139; liver normal albumin 3.2     Review of Systems Unable to perform ROS: medical condition     Physical Exam Constitutional: No distress.  Morbid obesity   Eyes: Conjunctivae are normal.  Neck: Neck supple. No JVD present. No thyromegaly present.  Cardiovascular: Normal rate, regular rhythm and intact distal pulses.   Respiratory: Effort normal  No respiratory distress. She has no wheezes. breath sounds diminished GI: Soft. Bowel sounds are normal. She exhibits no distension. There is no tenderness.  Musculoskeletal: She exhibits no edema.  Able to move all extremities   Lymphadenopathy:    She has no cervical adenopathy.  Neurological: She is alert.  Skin: Skin is warm and dry. She is not diaphoretic.  Flank abrasion areas: with yellow slough present in wound beds; surrounding area is red hot and inflamed. No drainage present  Psychiatric: She has a normal mood and affect.       ASSESSMENT/ PLAN:  1. Schizophrenia: she is worse ; she does talk to herself; is having more intense behavioral issues. will continue  depakote 1250 mg daily; and lamictal 100 mg twice daily to stabilize mood. Will continue ativan  0.5 mg three times daily as needed for anxiety has ativan gel 1 mg twice daily as needed  Is followed by psych services  I have spoken with the psych provider; will increase her thorazine to 50 mg at 12p 50 mg at  6p and 100 mg nightly will monitor her status.    2. CHF; unspecified type per chest x-ray on 04-21-15: will continue lasix 20 mg daily she remains without change in status. She did decline the venous doppler and 2-d echo    Time spent with patient  30   minutes >50% time spent counseling; reviewing medical record; tests; labs; and developing future plan of care   Synthia Innocent NP [facility]  Contact [phone] Monday through Friday 8am- 5pm  After hours call [phone]

## 2015-05-13 ENCOUNTER — Encounter: Payer: Self-pay | Admitting: Adult Health

## 2015-05-13 ENCOUNTER — Non-Acute Institutional Stay (SKILLED_NURSING_FACILITY): Payer: Medicare Other | Admitting: Adult Health

## 2015-05-13 ENCOUNTER — Encounter (HOSPITAL_COMMUNITY): Payer: Self-pay | Admitting: Emergency Medicine

## 2015-05-13 ENCOUNTER — Inpatient Hospital Stay (HOSPITAL_COMMUNITY)
Admission: EM | Admit: 2015-05-13 | Discharge: 2015-05-21 | DRG: 871 | Disposition: A | Discharge status: Skilled Nursing Facility | Payer: Medicare Other | Attending: Internal Medicine | Admitting: Internal Medicine

## 2015-05-13 ENCOUNTER — Observation Stay (HOSPITAL_COMMUNITY): Payer: Medicare Other

## 2015-05-13 DIAGNOSIS — I480 Paroxysmal atrial fibrillation: Secondary | ICD-10-CM | POA: Diagnosis present

## 2015-05-13 DIAGNOSIS — I119 Hypertensive heart disease without heart failure: Secondary | ICD-10-CM

## 2015-05-13 DIAGNOSIS — N179 Acute kidney failure, unspecified: Secondary | ICD-10-CM | POA: Diagnosis present

## 2015-05-13 DIAGNOSIS — N61 Mastitis without abscess: Secondary | ICD-10-CM | POA: Diagnosis not present

## 2015-05-13 DIAGNOSIS — D649 Anemia, unspecified: Secondary | ICD-10-CM | POA: Diagnosis present

## 2015-05-13 DIAGNOSIS — G9340 Encephalopathy, unspecified: Secondary | ICD-10-CM | POA: Diagnosis present

## 2015-05-13 DIAGNOSIS — L0390 Cellulitis, unspecified: Secondary | ICD-10-CM | POA: Insufficient documentation

## 2015-05-13 DIAGNOSIS — E785 Hyperlipidemia, unspecified: Secondary | ICD-10-CM | POA: Diagnosis present

## 2015-05-13 DIAGNOSIS — E861 Hypovolemia: Secondary | ICD-10-CM | POA: Diagnosis present

## 2015-05-13 DIAGNOSIS — N390 Urinary tract infection, site not specified: Secondary | ICD-10-CM | POA: Diagnosis present

## 2015-05-13 DIAGNOSIS — A419 Sepsis, unspecified organism: Secondary | ICD-10-CM | POA: Diagnosis present

## 2015-05-13 DIAGNOSIS — F70 Mild intellectual disabilities: Secondary | ICD-10-CM | POA: Diagnosis present

## 2015-05-13 DIAGNOSIS — E875 Hyperkalemia: Secondary | ICD-10-CM | POA: Diagnosis present

## 2015-05-13 DIAGNOSIS — E662 Morbid (severe) obesity with alveolar hypoventilation: Secondary | ICD-10-CM | POA: Diagnosis present

## 2015-05-13 DIAGNOSIS — G4733 Obstructive sleep apnea (adult) (pediatric): Secondary | ICD-10-CM | POA: Diagnosis present

## 2015-05-13 DIAGNOSIS — I252 Old myocardial infarction: Secondary | ICD-10-CM

## 2015-05-13 DIAGNOSIS — E860 Dehydration: Secondary | ICD-10-CM | POA: Diagnosis present

## 2015-05-13 DIAGNOSIS — Z90710 Acquired absence of both cervix and uterus: Secondary | ICD-10-CM

## 2015-05-13 DIAGNOSIS — Z8249 Family history of ischemic heart disease and other diseases of the circulatory system: Secondary | ICD-10-CM

## 2015-05-13 DIAGNOSIS — Z4659 Encounter for fitting and adjustment of other gastrointestinal appliance and device: Secondary | ICD-10-CM

## 2015-05-13 DIAGNOSIS — Z452 Encounter for adjustment and management of vascular access device: Secondary | ICD-10-CM

## 2015-05-13 DIAGNOSIS — Z6841 Body Mass Index (BMI) 40.0 and over, adult: Secondary | ICD-10-CM

## 2015-05-13 DIAGNOSIS — G8929 Other chronic pain: Secondary | ICD-10-CM | POA: Diagnosis present

## 2015-05-13 DIAGNOSIS — N178 Other acute kidney failure: Secondary | ICD-10-CM | POA: Diagnosis not present

## 2015-05-13 DIAGNOSIS — Z7982 Long term (current) use of aspirin: Secondary | ICD-10-CM

## 2015-05-13 DIAGNOSIS — I110 Hypertensive heart disease with heart failure: Secondary | ICD-10-CM | POA: Diagnosis present

## 2015-05-13 DIAGNOSIS — Z87891 Personal history of nicotine dependence: Secondary | ICD-10-CM

## 2015-05-13 DIAGNOSIS — G40909 Epilepsy, unspecified, not intractable, without status epilepticus: Secondary | ICD-10-CM | POA: Diagnosis present

## 2015-05-13 DIAGNOSIS — J449 Chronic obstructive pulmonary disease, unspecified: Secondary | ICD-10-CM | POA: Diagnosis present

## 2015-05-13 DIAGNOSIS — E872 Acidosis: Secondary | ICD-10-CM | POA: Diagnosis present

## 2015-05-13 DIAGNOSIS — R0902 Hypoxemia: Secondary | ICD-10-CM

## 2015-05-13 DIAGNOSIS — A4151 Sepsis due to Escherichia coli [E. coli]: Principal | ICD-10-CM | POA: Diagnosis present

## 2015-05-13 DIAGNOSIS — E7800 Pure hypercholesterolemia, unspecified: Secondary | ICD-10-CM | POA: Diagnosis present

## 2015-05-13 DIAGNOSIS — F250 Schizoaffective disorder, bipolar type: Secondary | ICD-10-CM | POA: Diagnosis present

## 2015-05-13 DIAGNOSIS — Z01818 Encounter for other preprocedural examination: Secondary | ICD-10-CM

## 2015-05-13 DIAGNOSIS — I509 Heart failure, unspecified: Secondary | ICD-10-CM

## 2015-05-13 DIAGNOSIS — G9341 Metabolic encephalopathy: Secondary | ICD-10-CM | POA: Diagnosis present

## 2015-05-13 DIAGNOSIS — L03312 Cellulitis of back [any part except buttock]: Secondary | ICD-10-CM | POA: Diagnosis not present

## 2015-05-13 DIAGNOSIS — L03115 Cellulitis of right lower limb: Secondary | ICD-10-CM | POA: Insufficient documentation

## 2015-05-13 DIAGNOSIS — E8770 Fluid overload, unspecified: Secondary | ICD-10-CM | POA: Diagnosis present

## 2015-05-13 DIAGNOSIS — J9621 Acute and chronic respiratory failure with hypoxia: Secondary | ICD-10-CM | POA: Diagnosis present

## 2015-05-13 DIAGNOSIS — J9622 Acute and chronic respiratory failure with hypercapnia: Secondary | ICD-10-CM | POA: Diagnosis present

## 2015-05-13 DIAGNOSIS — R6521 Severe sepsis with septic shock: Secondary | ICD-10-CM | POA: Diagnosis present

## 2015-05-13 DIAGNOSIS — Z9049 Acquired absence of other specified parts of digestive tract: Secondary | ICD-10-CM

## 2015-05-13 DIAGNOSIS — K219 Gastro-esophageal reflux disease without esophagitis: Secondary | ICD-10-CM | POA: Diagnosis present

## 2015-05-13 DIAGNOSIS — L8990 Pressure ulcer of unspecified site, unspecified stage: Secondary | ICD-10-CM | POA: Diagnosis present

## 2015-05-13 DIAGNOSIS — E039 Hypothyroidism, unspecified: Secondary | ICD-10-CM | POA: Diagnosis present

## 2015-05-13 DIAGNOSIS — I69322 Dysarthria following cerebral infarction: Secondary | ICD-10-CM

## 2015-05-13 DIAGNOSIS — E46 Unspecified protein-calorie malnutrition: Secondary | ICD-10-CM | POA: Diagnosis present

## 2015-05-13 DIAGNOSIS — I4891 Unspecified atrial fibrillation: Secondary | ICD-10-CM | POA: Diagnosis present

## 2015-05-13 DIAGNOSIS — L89152 Pressure ulcer of sacral region, stage 2: Secondary | ICD-10-CM | POA: Diagnosis present

## 2015-05-13 DIAGNOSIS — E119 Type 2 diabetes mellitus without complications: Secondary | ICD-10-CM | POA: Diagnosis present

## 2015-05-13 DIAGNOSIS — E871 Hypo-osmolality and hyponatremia: Secondary | ICD-10-CM | POA: Diagnosis present

## 2015-05-13 DIAGNOSIS — Z978 Presence of other specified devices: Secondary | ICD-10-CM

## 2015-05-13 DIAGNOSIS — J9612 Chronic respiratory failure with hypercapnia: Secondary | ICD-10-CM | POA: Diagnosis present

## 2015-05-13 DIAGNOSIS — Z7401 Bed confinement status: Secondary | ICD-10-CM

## 2015-05-13 LAB — URINE MICROSCOPIC-ADD ON

## 2015-05-13 LAB — BASIC METABOLIC PANEL
Anion gap: 7 (ref 5–15)
BUN: 60 mg/dL — AB (ref 6–20)
CHLORIDE: 92 mmol/L — AB (ref 101–111)
CO2: 33 mmol/L — AB (ref 22–32)
CREATININE: 2.93 mg/dL — AB (ref 0.44–1.00)
Calcium: 9.6 mg/dL (ref 8.9–10.3)
GFR calc non Af Amer: 16 mL/min — ABNORMAL LOW (ref >60)
GFR, EST AFRICAN AMERICAN: 19 mL/min — AB (ref >60)
GLUCOSE: 100 mg/dL — AB (ref 65–99)
Potassium: 5.3 mmol/L — ABNORMAL HIGH (ref 3.5–5.1)
Sodium: 132 mmol/L — ABNORMAL LOW (ref 135–145)

## 2015-05-13 LAB — CBC WITH DIFFERENTIAL/PLATELET
Basophils Absolute: 0 10*3/uL (ref 0.0–0.1)
Basophils Relative: 0 %
Eosinophils Absolute: 0.1 10*3/uL (ref 0.0–0.7)
Eosinophils Relative: 1 %
HEMATOCRIT: 37 % (ref 36.0–46.0)
HEMOGLOBIN: 11.2 g/dL — AB (ref 12.0–15.0)
LYMPHS ABS: 1.7 10*3/uL (ref 0.7–4.0)
Lymphocytes Relative: 26 %
MCH: 29.9 pg (ref 26.0–34.0)
MCHC: 30.3 g/dL (ref 30.0–36.0)
MCV: 98.9 fL (ref 78.0–100.0)
MONO ABS: 1.1 10*3/uL — AB (ref 0.1–1.0)
MONOS PCT: 17 %
NEUTROS ABS: 3.7 10*3/uL (ref 1.7–7.7)
NEUTROS PCT: 56 %
Platelets: 249 10*3/uL (ref 150–400)
RBC: 3.74 MIL/uL — ABNORMAL LOW (ref 3.87–5.11)
RDW: 17 % — AB (ref 11.5–15.5)
WBC: 6.6 10*3/uL (ref 4.0–10.5)

## 2015-05-13 LAB — MAGNESIUM: MAGNESIUM: 1.9 mg/dL (ref 1.7–2.4)

## 2015-05-13 LAB — URINALYSIS, ROUTINE W REFLEX MICROSCOPIC
BILIRUBIN URINE: NEGATIVE
Glucose, UA: NEGATIVE mg/dL
HGB URINE DIPSTICK: NEGATIVE
KETONES UR: NEGATIVE mg/dL
Nitrite: POSITIVE — AB
Protein, ur: NEGATIVE mg/dL
SPECIFIC GRAVITY, URINE: 1.015 (ref 1.005–1.030)
pH: 5.5 (ref 5.0–8.0)

## 2015-05-13 LAB — I-STAT CG4 LACTIC ACID, ED: Lactic Acid, Venous: 0.68 mmol/L (ref 0.5–2.0)

## 2015-05-13 LAB — PHOSPHORUS: PHOSPHORUS: 3.9 mg/dL (ref 2.5–4.6)

## 2015-05-13 LAB — MRSA PCR SCREENING: MRSA BY PCR: INVALID — AB

## 2015-05-13 MED ORDER — ENOXAPARIN SODIUM 40 MG/0.4ML ~~LOC~~ SOLN
40.0000 mg | SUBCUTANEOUS | Status: DC
  Administered 2015-05-13: 40 mg via SUBCUTANEOUS
  Filled 2015-05-13: qty 0.4

## 2015-05-13 MED ORDER — HYDROCODONE-ACETAMINOPHEN 5-325 MG PO TABS
1.0000 | ORAL_TABLET | ORAL | Status: DC | PRN
Start: 2015-05-13 — End: 2015-05-14

## 2015-05-13 MED ORDER — CHLORPROMAZINE HCL 25 MG PO TABS
25.0000 mg | ORAL_TABLET | Freq: Three times a day (TID) | ORAL | Status: DC
  Filled 2015-05-13 (×5): qty 1

## 2015-05-13 MED ORDER — LAMOTRIGINE 100 MG PO TABS
100.0000 mg | ORAL_TABLET | Freq: Two times a day (BID) | ORAL | Status: DC
  Filled 2015-05-13 (×2): qty 1

## 2015-05-13 MED ORDER — VANCOMYCIN HCL IN DEXTROSE 1-5 GM/200ML-% IV SOLN
1000.0000 mg | Freq: Once | INTRAVENOUS | Status: AC
  Administered 2015-05-13: 1000 mg via INTRAVENOUS
  Filled 2015-05-13: qty 200

## 2015-05-13 MED ORDER — SODIUM CHLORIDE 0.9 % IV SOLN
INTRAVENOUS | Status: DC
  Administered 2015-05-13: 13:00:00 via INTRAVENOUS

## 2015-05-13 MED ORDER — NICOTINE 21 MG/24HR TD PT24
21.0000 mg | MEDICATED_PATCH | Freq: Every day | TRANSDERMAL | Status: DC
Start: 2015-05-13 — End: 2015-05-21
  Administered 2015-05-13 – 2015-05-21 (×9): 21 mg via TRANSDERMAL
  Filled 2015-05-13 (×9): qty 1

## 2015-05-13 MED ORDER — PANTOPRAZOLE SODIUM 40 MG PO TBEC: 40.0000 mg | DELAYED_RELEASE_TABLET | Freq: Every day | ORAL | Status: DC

## 2015-05-13 MED ORDER — LORAZEPAM 2 MG/ML IJ SOLN
2.0000 mg | INTRAMUSCULAR | Status: DC | PRN
  Administered 2015-05-13: 3 mg via INTRAVENOUS
  Filled 2015-05-13: qty 2

## 2015-05-13 MED ORDER — LORAZEPAM 0.5 MG PO TABS: 0.5000 mg | ORAL_TABLET | Freq: Two times a day (BID) | ORAL | Status: DC | PRN

## 2015-05-13 MED ORDER — LEVOTHYROXINE SODIUM 75 MCG PO TABS: 150.0000 ug | ORAL_TABLET | Freq: Every morning | ORAL | Status: DC

## 2015-05-13 MED ORDER — IPRATROPIUM-ALBUTEROL 0.5-2.5 (3) MG/3ML IN SOLN: 3.0000 mL | RESPIRATORY_TRACT | Status: DC | PRN

## 2015-05-13 MED ORDER — ONDANSETRON HCL 4 MG PO TABS
4.0000 mg | ORAL_TABLET | Freq: Four times a day (QID) | ORAL | Status: DC | PRN
Start: 2015-05-13 — End: 2015-05-21

## 2015-05-13 MED ORDER — HYDROMORPHONE HCL 1 MG/ML IJ SOLN: 0.5000 mg | INTRAMUSCULAR | Status: DC | PRN

## 2015-05-13 MED ORDER — ASPIRIN 81 MG PO CHEW: 81.0000 mg | CHEWABLE_TABLET | Freq: Every day | ORAL | Status: DC

## 2015-05-13 MED ORDER — BISACODYL 10 MG RE SUPP: 10.0000 mg | RECTAL | Status: DC | PRN

## 2015-05-13 MED ORDER — DIPHENHYDRAMINE HCL 25 MG PO CAPS: 25.0000 mg | ORAL_CAPSULE | Freq: Three times a day (TID) | ORAL | Status: DC | PRN

## 2015-05-13 MED ORDER — MAGNESIUM HYDROXIDE 400 MG/5ML PO SUSP: 30.0000 mL | Freq: Every day | ORAL | Status: DC | PRN

## 2015-05-13 MED ORDER — SODIUM CHLORIDE 0.9 % IJ SOLN
3.0000 mL | Freq: Two times a day (BID) | INTRAMUSCULAR | Status: DC
  Administered 2015-05-13 – 2015-05-21 (×12): 3 mL via INTRAVENOUS

## 2015-05-13 MED ORDER — ENSURE ENLIVE PO LIQD: 237.0000 mL | Freq: Two times a day (BID) | ORAL | Status: DC

## 2015-05-13 MED ORDER — CLINDAMYCIN PHOSPHATE 600 MG/50ML IV SOLN: 600.0000 mg | Freq: Three times a day (TID) | INTRAVENOUS | Status: DC

## 2015-05-13 MED ORDER — DIVALPROEX SODIUM ER 250 MG PO TB24
1250.0000 mg | ORAL_TABLET | Freq: Every day | ORAL | Status: DC
  Filled 2015-05-13: qty 1

## 2015-05-13 MED ORDER — ONDANSETRON HCL 4 MG/2ML IJ SOLN: 4.0000 mg | Freq: Four times a day (QID) | INTRAMUSCULAR | Status: DC | PRN

## 2015-05-13 MED ORDER — CLINDAMYCIN HCL 300 MG PO CAPS: 300.0000 mg | ORAL_CAPSULE | Freq: Four times a day (QID) | ORAL | Status: DC

## 2015-05-13 MED ORDER — SODIUM CHLORIDE 0.9 % IV BOLUS (SEPSIS)
500.0000 mL | Freq: Once | INTRAVENOUS | Status: AC
  Administered 2015-05-13: 500 mL via INTRAVENOUS

## 2015-05-13 MED ORDER — SODIUM CHLORIDE 0.9 % IV SOLN
INTRAVENOUS | Status: DC
  Administered 2015-05-13 – 2015-05-15 (×4): via INTRAVENOUS
  Administered 2015-05-16: 75 mL via INTRAVENOUS
  Administered 2015-05-16: 14:00:00 via INTRAVENOUS

## 2015-05-13 NOTE — Progress Notes (Signed)
Patient was brought to IR from ED for PICC.  Patient was brought into the procedure room and then patient refused to stay in the procedure room, and refused to have PICC placed.  Patient was taken from IR to 1435.

## 2015-05-13 NOTE — ED Notes (Signed)
Report to 4W given over the phone, because patient went to IR and IR will transport patient to 4W.

## 2015-05-13 NOTE — Progress Notes (Signed)
Patient ID: [name], female   DOB: [birth year], 64 y.o.   MRN: [id number]    Facility:  [facility]      Allergies  Allergen Reactions  . Food Rash    "lasagna"= rash from the noodles  . Penicillins Hives  . Penicillins     Per MAR  . Sulfa Antibiotics     Per MAR  . Sulfamethoxazole-Trimethoprim Hives and Nausea And Vomiting    Chief Complaint  Patient presents with  . Acute Visit    patient status     HPI:  Staff reports that she has serious skin issues. She will decline bathing and will lay in tobacco juice from her chew and snuff use. She is very psychotic.  Her right side from her arm to her thigh is red hot inflamed and tender to touch. She has open lesions on her back from her tobacco use. There are no reports of fever    Past Medical History  Diagnosis Date  . Bipolar disorder (HCC)   . Schizoaffective disorder (HCC)   . Mood disorder (HCC)   . Epilepsy (HCC)   . Thyroid disease   . Hiatal hernia   . Atrial flutter (HCC)   . Schizoaffective disorder   . Urinary tract infection   . Hypertension   . GERD (gastroesophageal reflux disease)   . Hypothyroidism   . Obesity   . Asthma   . COPD (chronic obstructive pulmonary disease) (HCC)   . CHF (congestive heart failure) (HCC)   . Seizures (HCC)   . Morbid obesity (HCC)   . Chronic pain   . Hypercholesteremia   . Anginal pain (HCC)   . Myocardial infarction (HCC) 2002  . Diabetes mellitus     "I'm not diabetic anymore" (05/23/2012)  . Anemia   . History of blood transfusion 1993    "when I had my hysterectomy" (05/23/2012)  . H/O hiatal hernia   . Daily headache   . Arthritis     "severe; all over my body" (05/23/2012)  . Hypoventilation syndrome     [name] 05/30/2012  . Atrial fibrillation (HCC)   . Atrial flutter, paroxysmal (HCC)     [name] 05/30/2012  . Exertional dyspnea   . Shortness of breath     "all the time lately" (05/30/2012)  . Pneumonia     "several times; including now"  (04/23/2013)  . Anxiety   . Episodic mood disorder (HCC)   . Psychosis   . Schizophrenia (HCC)   . 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 CHOLELITHIASIS 11/30/2009    Qualifier: Diagnosis of  By: Brynda Rim      Past Surgical History  Procedure Laterality Date  . Tubal ligation  1998  . Cholecystectomy  ?2008  . Cardiac catheterization  2008  . Vaginal hysterectomy  1993    VITAL SIGNS BP 138/80 mmHg  Pulse 100  Ht  (1.6 m)  Wt 400 lb (181.439 kg)  BMI 70.87 kg/m2  Patient's Medications  New Prescriptions   No medications on file  Previous Medications   ASPIRIN 81 MG CHEWABLE TABLET    Chew 81 mg by mouth daily.   BISACODYL (DULCOLAX) 10 MG SUPPOSITORY    Place 10 mg rectally every 3 (three) days as needed for mild constipation or moderate constipation.   CHLORPROMAZINE (THORAZINE) 25 MG TABLET    Take 1-4 tablets (25-100 mg total) by mouth  3 (three) times daily. Give 50 mg at 12 pm 50 mg at 6 pm 100 mg nightly   DIVALPROEX (DEPAKOTE ER) 250 MG 24 HR TABLET    Take 1,250 mg by mouth at bedtime.   FUROSEMIDE (LASIX) 20 MG TABLET    Take 1 tablet (20 mg total) by mouth daily.   LAMOTRIGINE (LAMICTAL) 100 MG TABLET    Take 100 mg by mouth 2 (two) times daily. For mood disorder   LEVOTHYROXINE (SYNTHROID, LEVOTHROID) 150 MCG TABLET    Take 150 mcg by mouth every morning. For thyroid disease.   LISINOPRIL (PRINIVIL,ZESTRIL) 5 MG TABLET    Take 1 tablet (5 mg total) by mouth daily.   LORAZEPAM (ATIVAN) 0.5 MG TABLET    Take one tablet by mouth three times daily as needed for anxiety   LORAZEPAM (ATIVAN) 2 MG/ML CONCENTRATED SOLUTION    Take 1 mg by mouth 2 (two) times daily as needed for anxiety.   LURASIDONE (LATUDA) 40 MG TABS TABLET    Take 20 mg by mouth daily with breakfast.   MAGNESIUM HYDROXIDE (MILK OF MAGNESIA) 400 MG/5ML SUSPENSION    Take 30 mLs by mouth daily as needed for mild constipation.   MULTIPLE  VITAMIN (MULTIVITAMIN WITH MINERALS) TABS TABLET    Take 1 tablet by mouth daily.   OMEPRAZOLE (PRILOSEC) 40 MG CAPSULE    Take 40 mg by mouth daily.  Modified Medications   No medications on file  Discontinued Medications   No medications on file     SIGNIFICANT DIAGNOSTIC EXAMS   She is not a candidate for a mammogram or colonoscopy or dexa scan She has declined 2-d echo and venous doppler    05-10-14: chest x-ray: modest cardiomegaly with clear lungs   04-21-15: chest x-ray: cardiomegaly with mild chf.    LABS REVIEWED:   07-03-14 hgb a1c 4.7 07-08-14: stool for guaiac: neg  07-31-14: wbc 4.8; hgb 12.3; hct 41.1; mcv 106.6; plt 198 ;glucose 108; bun 9.3; creat 0.56; k+4.6; na++141; liver normal albumin 3.6  10-15-14: tsh 3.63; depakote 49  03-27-15: glucose 106; bun 7.3; creat 0.61; k+ 4.2; na++140 04-22-15: wbc 5.7 ;hgb 11.3; hct 39.6; mcv 103.5; plt 205; glucose 103; bun 7.1; creat 0.72; k+ 4.9; na++139; liver normal albumin 3.2     Review of Systems Unable to perform ROS: medical condition     Physical Exam Constitutional: No distress.  Morbid obesity   Eyes: Conjunctivae are normal.  Neck: Neck supple. No JVD present. No thyromegaly present.  Cardiovascular: Normal rate, regular rhythm and intact distal pulses.   Respiratory: Effort normal  No respiratory distress. She has no wheezes. breath sounds diminished GI: Soft. Bowel sounds are normal. She exhibits no distension. There is no tenderness.  Musculoskeletal: She exhibits no edema.  Able to move all extremities   Lymphadenopathy:    She has no cervical adenopathy.  Neurological: She is alert.  Skin: Skin is warm and dry. She is not diaphoretic.  Right upper arm; right outer breast; right side; right outer thigh; right flank; red inflamed hot and tender to touch.  Has numerous open lesions on right upper and lower back due to tobacco juice and her declining care frequently  Psychiatric: She has a normal mood and  affect.      ASSESSMENT/ PLAN:  Cellulitis: of right breast; side; outer thigh; flank:  Open lesions on right back  These are too intense to managed at this facility: will send her to the  ED for further evaluation and treatment options.  This facility can manage IV fluids and IV abt and can perform advanced wound care. There is a wound physician who comes to this facility weekly.     Time spent with patient   45  minutes >50% time spent counseling; reviewing medical record; tests; labs; and developing future plan of care   Synthia Innocent NP [facility]  Contact [phone] Monday through Friday 8am- 5pm  After hours call [phone]

## 2015-05-13 NOTE — H&P (Signed)
[facility] History and Physical  [name] ZOX:[id number] DOB: 1951-03-18 DOA: 05/13/2015  Referring physician: ED physician, Dr. Freida Busman PCP: Margit Hanks, MD   Chief Complaint: confusion and right breast cellulitis   HPI:  Pt is 64 yo female, morbidly obese, COPD and OHS, HTN, hypothyroidism, schizoaffective disorder, bipolar, presented to [facility] ED with main concern of several days duration or right breat area pain and swelling. Please note that pt is agitated and unable to provide any meaning full information at the time of the admission. Most of the information obtained from ED doctor and available records. No reports of fevers, dyspnea or chest pain in ED.  In ED, pt noted to be combative, VSS, blood work notable for Cr 2.93, K 5.2, TRH asked to admit for further evaluation.   Assessment and Plan: Principal Problem:   Acute encephalopathy - multifactorial and secondary to schizoaffective disorder, bipolar, ? Cellulitis, ARF with hyperkalemia, dehydration - treat cellulitis and place on IVF for renal failure - monitor on CIWA for now - reassess in AM  Active Problems:   Schizoaffective disorder, bipolar type (HCC) - place on CIWA protocol, unclear if pt drinks but did report smoking - needs psych evaluation once more medically stable    Cellulitis of right breast - cellulitis order set in place - placed on Clindamycin  - narrow down as clinically indicated    Acute renal failure (HCC) - likely pre renal in etiology - hold Lasix and Losartan - place on IVF - repeat BMP in AM   Hyperkalemia - in the setting or renal failure - stop Losartan - repeat BMP in AM - 12 lead EKG now   Morbid obesity (HCC) - BMI > 40   Atrial fibrillation (HCC) - rate controlled for now   Hypercapnic respiratory failure, chronic (HCC) - secondary to COPD and OSA/OHS - provide oxygen if needed - no wheezing on exam   Hypothyroidism - continue synthroid    Benign hypertensive  heart disease without heart failure - hold Lasix and Losartan    Hyponatremia - provide IVF - hold Lasix - BMP in AM  Lovenox SQ for DVT prophylaxis   Radiological Exams on Admission: No results found.   Code Status: Full Family Communication: No family at bedside  Disposition Plan: Admit for further evaluation    Danie Binder [facility] [phone]   Review of Systems:  Unable to obtain due to AMS  Past Medical History  Diagnosis Date  . Bipolar disorder (HCC)   . Schizoaffective disorder (HCC)   . Mood disorder (HCC)   . Epilepsy (HCC)   . Thyroid disease   . Hiatal hernia   . Atrial flutter (HCC)   . Schizoaffective disorder   . Urinary tract infection   . Hypertension   . GERD (gastroesophageal reflux disease)   . Hypothyroidism   . Obesity   . Asthma   . COPD (chronic obstructive pulmonary disease) (HCC)   . CHF (congestive heart failure) (HCC)   . Seizures (HCC)   . Morbid obesity (HCC)   . Chronic pain   . Hypercholesteremia   . Anginal pain (HCC)   . Myocardial infarction (HCC) 2002  . Diabetes mellitus     "I'm not diabetic anymore" (05/23/2012)  . Anemia   . History of blood transfusion 1993    "when I had my hysterectomy" (05/23/2012)  . H/O hiatal hernia   . Daily headache   . Arthritis     "severe; all over  my body" (05/23/2012)  . Hypoventilation syndrome     [name] 05/30/2012  . Atrial fibrillation (HCC)   . Atrial flutter, paroxysmal (HCC)     [name] 05/30/2012  . Exertional dyspnea   . Shortness of breath     "all the time lately" (05/30/2012)  . Pneumonia     "several times; including now" (04/23/2013)  . Anxiety   . Episodic mood disorder (HCC)   . Psychosis   . Schizophrenia (HCC)   . 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 CHOLELITHIASIS 11/30/2009    Qualifier: Diagnosis of  By: Brynda Rim      Past Surgical History  Procedure Laterality Date  . Tubal ligation  1998  .  Cholecystectomy  ?2008  . Cardiac catheterization  2008  . Vaginal hysterectomy  1993    Social History:  reports that she quit smoking about 14 years ago. Her smoking use included Cigarettes. She has a 24 pack-year smoking history. Her smokeless tobacco use includes Chew. She reports that she does not drink alcohol or use illicit drugs.  Allergies  Allergen Reactions  . Food Rash    "lasagna"= rash from the noodles  . Penicillins Hives  . Penicillins     Per MAR  . Sulfa Antibiotics     Per MAR  . Sulfamethoxazole-Trimethoprim Hives and Nausea And Vomiting    Family History  Problem Relation Age of Onset  . Heart disease Father   . Cancer Mother 54    Breast    Prior to Admission medications   Medication Sig Start Date End Date Taking? Authorizing Provider  aspirin 81 MG chewable tablet Chew 81 mg by mouth daily.   Yes Historical Provider, MD  bisacodyl (DULCOLAX) 10 MG suppository Place 10 mg rectally every 3 (three) days as needed for mild constipation or moderate constipation.   Yes Historical Provider, MD  chlorproMAZINE (THORAZINE) 50 MG tablet Take 50-100 mg by mouth. Take 50 mg twice daily (at 1200 and 1800) and take 100 mg at 2300 05/07/15  Yes Historical Provider, MD  diphenhydrAMINE (BENADRYL) 25 MG tablet Take 25 mg by mouth every 8 (eight) hours as needed for itching.   Yes Historical Provider, MD  divalproex (DEPAKOTE ER) 250 MG 24 hr tablet Take 1,250 mg by mouth at bedtime.   Yes Historical Provider, MD  doxycycline (VIBRAMYCIN) 100 MG capsule Take 100 mg by mouth 2 (two) times daily. For 4 weeks 05/04/15  Yes Historical Provider, MD  furosemide (LASIX) 20 MG tablet Take 1 tablet (20 mg total) by mouth daily. 04/22/15  Yes Sharee Holster, NP  lamoTRIgine (LAMICTAL) 100 MG tablet Take 100 mg by mouth 2 (two) times daily. For mood disorder   Yes Historical Provider, MD  levothyroxine (SYNTHROID, LEVOTHROID) 150 MCG tablet Take 150 mcg by mouth every morning. For  thyroid disease. 05/11/12  Yes Lloyd Huger T Mashburn, PA-C  lisinopril (PRINIVIL,ZESTRIL) 5 MG tablet Take 1 tablet (5 mg total) by mouth daily. 04/22/15  Yes Sharee Holster, NP  LORazepam (ATIVAN) 0.5 MG tablet Take one tablet by mouth three times daily as needed for anxiety 06/24/14  Yes Mahima Glade Lloyd, MD  magnesium hydroxide (MILK OF MAGNESIA) 400 MG/5ML suspension Take 30 mLs by mouth daily as needed for mild constipation.   Yes Historical Provider, MD  omeprazole (PRILOSEC) 40 MG capsule Take 40 mg by mouth daily.   Yes Historical Provider, MD  chlorproMAZINE (THORAZINE) 25 MG tablet Take 1-4 tablets (25-100 mg total) by mouth 3 (three) times daily. Give 50 mg at 12 pm 50 mg at 6 pm 100 mg nightly 05/07/15   Sharee Holster, NP    Physical Exam: Filed Vitals:   05/13/15 1220  BP: 120/95  Pulse: 93  Temp: 98.1 F (36.7 C)  TempSrc: Oral  Resp: 20  SpO2: 93%    Physical Exam  Constitutional: Appears agitated, combative  HENT: Normocephalic. External right and left ear normal. Dry MM Eyes: Conjunctivae and EOM are normal. PERRLA, no scleral icterus.  Neck: Normal ROM. Neck supple. No JVD. No tracheal deviation. No thyromegaly.  CVS: RRR, S1/S2 +, no murmurs, no gallops, no carotid bruit.  Pulmonary: Effort and breath sounds normal, diminished breath sounds at bases  Abdominal: Soft. BS +,  no distension, tenderness, rebound or guarding.  Musculoskeletal: Normal range of motion.  Lymphadenopathy: No lymphadenopathy noted, cervical, inguinal. Neuro: Combative, moving all 4 extremities  Skin: Skin is warm and dry. No rash noted. Area under right breath and lateral to it with erythema and TTP Psychiatric: Difficult to assess due to AMS  Labs on Admission:  Basic Metabolic Panel:  Recent Labs Lab 05/13/15 1257  NA 132*  K 5.3*  CL 92*  CO2 33*  GLUCOSE 100*  BUN 60*  CREATININE 2.93*  CALCIUM 9.6   CBC:  Recent Labs Lab 05/13/15 1257  WBC 6.6  NEUTROABS 3.7  HGB 11.2*   HCT 37.0  MCV 98.9  PLT 249   EKG: pending   If 7PM-7AM, please contact night-coverage [email or web address] Password [email or web address] 05/13/2015, 2:02 PM

## 2015-05-13 NOTE — ED Notes (Signed)
Bed: WA09 Expected date:  Expected time:  Means of arrival:  Comments: EMS- 64yo F, needs IV access?/aggressive

## 2015-05-13 NOTE — ED Provider Notes (Signed)
CSN: [id number]     Arrival date & time 05/13/15  1200 History   First MD Initiated Contact with Patient 05/13/15 1235     Chief Complaint  Patient presents with  . IV Treatment      (Consider location/radiation/quality/duration/timing/severity/associated sxs/prior Treatment) HPI Comments: Pt here [facility] with right back and lateral breast cellulitis No reported fever, vomiting, -- sx have been for several days Attempted to start an iv at [facility] but was unsuccesfull-- All hx per NH and ems as pt is a difficult historian  The history is provided by the patient.    Past Medical History  Diagnosis Date  . Bipolar disorder (HCC)   . Schizoaffective disorder (HCC)   . Mood disorder (HCC)   . Epilepsy (HCC)   . Thyroid disease   . Hiatal hernia   . Atrial flutter (HCC)   . Schizoaffective disorder   . Urinary tract infection   . Hypertension   . GERD (gastroesophageal reflux disease)   . Hypothyroidism   . Obesity   . Asthma   . COPD (chronic obstructive pulmonary disease) (HCC)   . CHF (congestive heart failure) (HCC)   . Seizures (HCC)   . Morbid obesity (HCC)   . Chronic pain   . Hypercholesteremia   . Anginal pain (HCC)   . Myocardial infarction (HCC) 2002  . Diabetes mellitus     "I'm not diabetic anymore" (05/23/2012)  . Anemia   . History of blood transfusion 1993    "when I had my hysterectomy" (05/23/2012)  . H/O hiatal hernia   . Daily headache   . Arthritis     "severe; all over my body" (05/23/2012)  . Hypoventilation syndrome     [name] 05/30/2012  . Atrial fibrillation (HCC)   . Atrial flutter, paroxysmal (HCC)     [name] 05/30/2012  . Exertional dyspnea   . Shortness of breath     "all the time lately" (05/30/2012)  . Pneumonia     "several times; including now" (04/23/2013)  . Anxiety   . Episodic mood disorder (HCC)   . Psychosis   . Schizophrenia (HCC)   . RETARDATION, MENTAL, MILD 02/06/2007    Qualifier: Diagnosis of  By: Barbaraann Barthel MD, [place]     . CHOLELITHIASIS 11/30/2009    Qualifier: Diagnosis of  By: Brynda Rim     Past Surgical History  Procedure Laterality Date  . Tubal ligation  1998  . Cholecystectomy  ?2008  . Cardiac catheterization  2008  . Vaginal hysterectomy  1993   Family History  Problem Relation Age of Onset  . Heart disease Father   . Cancer Mother 15    Breast   Social History  Substance Use Topics  . Smoking status: Former Smoker -- 2.00 packs/day for 12 years    Types: Cigarettes    Quit date: 06/29/2000  . Smokeless tobacco: Current User    Types: Chew  . Alcohol Use: No   OB History    Gravida Para Term Preterm AB TAB SAB Ectopic Multiple Living   0 0 0 0 0 0 0 0       Review of Systems  Unable to perform ROS: Psychiatric disorder      Allergies  Food; Penicillins; Penicillins; Sulfa antibiotics; and Sulfamethoxazole-trimethoprim  Home Medications   Prior to Admission medications   Medication Sig Start Date End Date Taking? Authorizing Provider  aspirin 81 MG chewable tablet Chew 81 mg by mouth daily.  Historical Provider, MD  bisacodyl (DULCOLAX) 10 MG suppository Place 10 mg rectally every 3 (three) days as needed for mild constipation or moderate constipation.    Historical Provider, MD  chlorproMAZINE (THORAZINE) 25 MG tablet Take 1-4 tablets (25-100 mg total) by mouth 3 (three) times daily. Give 50 mg at 12 pm 50 mg at 6 pm 100 mg nightly 05/07/15   Sharee Holster, NP  divalproex (DEPAKOTE ER) 250 MG 24 hr tablet Take 1,250 mg by mouth at bedtime.    Historical Provider, MD  furosemide (LASIX) 20 MG tablet Take 1 tablet (20 mg total) by mouth daily. 04/22/15   Sharee Holster, NP  lamoTRIgine (LAMICTAL) 100 MG tablet Take 100 mg by mouth 2 (two) times daily. For mood disorder    Historical Provider, MD  levothyroxine (SYNTHROID, LEVOTHROID) 150 MCG tablet Take 150 mcg by mouth every morning. For thyroid disease. 05/11/12   Tamala Julian, PA-C  lisinopril  (PRINIVIL,ZESTRIL) 5 MG tablet Take 1 tablet (5 mg total) by mouth daily. 04/22/15   Sharee Holster, NP  LORazepam (ATIVAN) 0.5 MG tablet Take one tablet by mouth three times daily as needed for anxiety 06/24/14   Oneal Grout, MD  LORazepam (ATIVAN) 2 MG/ML concentrated solution Take 1 mg by mouth 2 (two) times daily as needed for anxiety.    Historical Provider, MD  lurasidone (LATUDA) 40 MG TABS tablet Take 20 mg by mouth daily with breakfast.    Historical Provider, MD  magnesium hydroxide (MILK OF MAGNESIA) 400 MG/5ML suspension Take 30 mLs by mouth daily as needed for mild constipation.    Historical Provider, MD  Multiple Vitamin (MULTIVITAMIN WITH MINERALS) TABS tablet Take 1 tablet by mouth daily.    Historical Provider, MD  omeprazole (PRILOSEC) 40 MG capsule Take 40 mg by mouth daily.    Historical Provider, MD   BP 120/95 mmHg  Pulse 93  Temp(Src) 98.1 F (36.7 C) (Oral)  Resp 20  SpO2 93% Physical Exam  Constitutional: She is oriented to person, place, and time. She appears well-developed and well-nourished.  Non-toxic appearance. No distress.  HENT:  Head: Normocephalic and atraumatic.  Eyes: Conjunctivae, EOM and lids are normal. Pupils are equal, round, and reactive to light.  Neck: Normal range of motion. Neck supple. No tracheal deviation present. No thyroid mass present.  Cardiovascular: Normal rate, regular rhythm and normal heart sounds.  Exam reveals no gallop.   No murmur heard. Pulmonary/Chest: Effort normal and breath sounds normal. No stridor. No respiratory distress. She has no decreased breath sounds. She has no wheezes. She has no rhonchi. She has no rales.  Abdominal: Soft. Normal appearance and bowel sounds are normal. She exhibits no distension. There is no tenderness. There is no rebound and no CVA tenderness.  Musculoskeletal: Normal range of motion. She exhibits no edema or tenderness.  Neurological: She is oriented to person, place, and time. No cranial  nerve deficit or sensory deficit. GCS eye subscore is 4. GCS verbal subscore is 5. GCS motor subscore is 6.  Skin: Skin is warm and dry. No abrasion and no rash noted.  lg area of erythema on right flank and breast without crepitus  Psychiatric: Her affect is blunt. Her speech is delayed. She is slowed.  Nursing note and vitals reviewed.   ED Course  Procedures (including critical care time) Labs Review Labs Reviewed  CBC WITH DIFFERENTIAL/PLATELET  BASIC METABOLIC PANEL    Imaging Review No results found. I have personally  reviewed and evaluated these images and lab results as part of my medical decision-making.   EKG Interpretation None      MDM   Final diagnoses:  None    Pt started on iv abx and will be admitted to the medicine service    Lorre Nick, MD 05/13/15 1409

## 2015-05-13 NOTE — ED Notes (Signed)
Pt can go to floor at 14:38 [Tara]

## 2015-05-13 NOTE — ED Notes (Addendum)
Per EMS patient needs IV access for cellulitis. Facility could not obtain access due to patient size. Alert, oriented x3 (disoriented to situation). Patient from [facility]

## 2015-05-13 NOTE — Progress Notes (Signed)
MD paged regarding patient's O2 Sat 81% RA, but improving with 2L/[place]. Orders placed. MD also aware of patient being unable to receive continuous IV fluids and IV ABT d/t placement of PIV. IV ABT changed to PO per MD.

## 2015-05-14 ENCOUNTER — Observation Stay (HOSPITAL_COMMUNITY): Payer: Medicare Other

## 2015-05-14 DIAGNOSIS — J9621 Acute and chronic respiratory failure with hypoxia: Secondary | ICD-10-CM | POA: Diagnosis present

## 2015-05-14 DIAGNOSIS — Z9049 Acquired absence of other specified parts of digestive tract: Secondary | ICD-10-CM | POA: Diagnosis not present

## 2015-05-14 DIAGNOSIS — E7800 Pure hypercholesterolemia, unspecified: Secondary | ICD-10-CM | POA: Diagnosis present

## 2015-05-14 DIAGNOSIS — F250 Schizoaffective disorder, bipolar type: Secondary | ICD-10-CM | POA: Diagnosis present

## 2015-05-14 DIAGNOSIS — K219 Gastro-esophageal reflux disease without esophagitis: Secondary | ICD-10-CM | POA: Diagnosis present

## 2015-05-14 DIAGNOSIS — J9602 Acute respiratory failure with hypercapnia: Secondary | ICD-10-CM | POA: Diagnosis not present

## 2015-05-14 DIAGNOSIS — Z7401 Bed confinement status: Secondary | ICD-10-CM | POA: Diagnosis not present

## 2015-05-14 DIAGNOSIS — J449 Chronic obstructive pulmonary disease, unspecified: Secondary | ICD-10-CM | POA: Diagnosis present

## 2015-05-14 DIAGNOSIS — E039 Hypothyroidism, unspecified: Secondary | ICD-10-CM | POA: Diagnosis present

## 2015-05-14 DIAGNOSIS — Z7982 Long term (current) use of aspirin: Secondary | ICD-10-CM | POA: Diagnosis not present

## 2015-05-14 DIAGNOSIS — R6521 Severe sepsis with septic shock: Secondary | ICD-10-CM | POA: Diagnosis present

## 2015-05-14 DIAGNOSIS — N179 Acute kidney failure, unspecified: Secondary | ICD-10-CM | POA: Diagnosis present

## 2015-05-14 DIAGNOSIS — Z8249 Family history of ischemic heart disease and other diseases of the circulatory system: Secondary | ICD-10-CM | POA: Diagnosis not present

## 2015-05-14 DIAGNOSIS — B9620 Unspecified Escherichia coli [E. coli] as the cause of diseases classified elsewhere: Secondary | ICD-10-CM | POA: Diagnosis not present

## 2015-05-14 DIAGNOSIS — G9340 Encephalopathy, unspecified: Secondary | ICD-10-CM

## 2015-05-14 DIAGNOSIS — N61 Mastitis without abscess: Secondary | ICD-10-CM | POA: Diagnosis present

## 2015-05-14 DIAGNOSIS — J9622 Acute and chronic respiratory failure with hypercapnia: Secondary | ICD-10-CM

## 2015-05-14 DIAGNOSIS — E46 Unspecified protein-calorie malnutrition: Secondary | ICD-10-CM | POA: Diagnosis present

## 2015-05-14 DIAGNOSIS — E662 Morbid (severe) obesity with alveolar hypoventilation: Secondary | ICD-10-CM | POA: Diagnosis present

## 2015-05-14 DIAGNOSIS — N178 Other acute kidney failure: Secondary | ICD-10-CM

## 2015-05-14 DIAGNOSIS — E860 Dehydration: Secondary | ICD-10-CM | POA: Diagnosis present

## 2015-05-14 DIAGNOSIS — A4151 Sepsis due to Escherichia coli [E. coli]: Secondary | ICD-10-CM | POA: Diagnosis present

## 2015-05-14 DIAGNOSIS — D649 Anemia, unspecified: Secondary | ICD-10-CM | POA: Diagnosis present

## 2015-05-14 DIAGNOSIS — I480 Paroxysmal atrial fibrillation: Secondary | ICD-10-CM | POA: Diagnosis present

## 2015-05-14 DIAGNOSIS — G8929 Other chronic pain: Secondary | ICD-10-CM | POA: Diagnosis present

## 2015-05-14 DIAGNOSIS — I252 Old myocardial infarction: Secondary | ICD-10-CM | POA: Diagnosis not present

## 2015-05-14 DIAGNOSIS — I509 Heart failure, unspecified: Secondary | ICD-10-CM | POA: Diagnosis not present

## 2015-05-14 DIAGNOSIS — E871 Hypo-osmolality and hyponatremia: Secondary | ICD-10-CM | POA: Diagnosis present

## 2015-05-14 DIAGNOSIS — A419 Sepsis, unspecified organism: Secondary | ICD-10-CM

## 2015-05-14 DIAGNOSIS — E119 Type 2 diabetes mellitus without complications: Secondary | ICD-10-CM | POA: Diagnosis present

## 2015-05-14 DIAGNOSIS — Z87891 Personal history of nicotine dependence: Secondary | ICD-10-CM | POA: Diagnosis not present

## 2015-05-14 DIAGNOSIS — G40909 Epilepsy, unspecified, not intractable, without status epilepticus: Secondary | ICD-10-CM | POA: Diagnosis present

## 2015-05-14 DIAGNOSIS — J9600 Acute respiratory failure, unspecified whether with hypoxia or hypercapnia: Secondary | ICD-10-CM | POA: Insufficient documentation

## 2015-05-14 DIAGNOSIS — N390 Urinary tract infection, site not specified: Secondary | ICD-10-CM | POA: Diagnosis present

## 2015-05-14 DIAGNOSIS — E861 Hypovolemia: Secondary | ICD-10-CM | POA: Diagnosis present

## 2015-05-14 DIAGNOSIS — E872 Acidosis: Secondary | ICD-10-CM | POA: Diagnosis present

## 2015-05-14 DIAGNOSIS — L89152 Pressure ulcer of sacral region, stage 2: Secondary | ICD-10-CM | POA: Diagnosis present

## 2015-05-14 DIAGNOSIS — G9341 Metabolic encephalopathy: Secondary | ICD-10-CM | POA: Diagnosis present

## 2015-05-14 DIAGNOSIS — I69322 Dysarthria following cerebral infarction: Secondary | ICD-10-CM | POA: Diagnosis not present

## 2015-05-14 DIAGNOSIS — Z6841 Body Mass Index (BMI) 40.0 and over, adult: Secondary | ICD-10-CM | POA: Diagnosis not present

## 2015-05-14 DIAGNOSIS — E785 Hyperlipidemia, unspecified: Secondary | ICD-10-CM | POA: Diagnosis present

## 2015-05-14 DIAGNOSIS — Z90710 Acquired absence of both cervix and uterus: Secondary | ICD-10-CM | POA: Diagnosis not present

## 2015-05-14 DIAGNOSIS — F70 Mild intellectual disabilities: Secondary | ICD-10-CM | POA: Diagnosis present

## 2015-05-14 DIAGNOSIS — E875 Hyperkalemia: Secondary | ICD-10-CM | POA: Diagnosis present

## 2015-05-14 LAB — BLOOD GAS, ARTERIAL
Acid-Base Excess: 1.8 mmol/L (ref 0.0–2.0)
Acid-Base Excess: 0.7 mmol/L (ref 0.0–2.0)
Acid-base deficit: 0.2 mmol/L (ref 0.0–2.0)
Bicarbonate: 31.1 mEq/L — ABNORMAL HIGH (ref 20.0–24.0)
Bicarbonate: 29.9 mEq/L — ABNORMAL HIGH (ref 20.0–24.0)
Bicarbonate: 29.4 mEq/L — ABNORMAL HIGH (ref 20.0–24.0)
Delivery systems: POSITIVE
Drawn by: 235321
Drawn by: 422461
Drawn by: 422461
Expiratory PAP: 6
FIO2: 1
FIO2: 1
Inspiratory PAP: 20
MECHVT: 420 mL
Mode: POSITIVE
O2 Content: 2 L/min
O2 SAT: 98.5 %
O2 Saturation: 91.5 %
O2 Saturation: 99.5 %
PATIENT TEMPERATURE: 98.8
PCO2 ART: 75.5 mmHg — AB (ref 35.0–45.0)
PEEP/CPAP: 5 cmH2O
PH ART: 7.216 — AB (ref 7.350–7.450)
PO2 ART: 152 mmHg — AB (ref 80.0–100.0)
Patient temperature: 98.2
Patient temperature: 98.8
RATE: 20 resp/min
RATE: 18 resp/min
TCO2: 30.3 mmol/L (ref 0–100)
TCO2: 29.3 mmol/L (ref 0–100)
TCO2: 28.3 mmol/L (ref 0–100)
pCO2 arterial: 82.1 mmHg (ref 35.0–45.0)
pCO2 arterial: 88.1 mmHg (ref 35.0–45.0)
pH, Arterial: 7.201 — ABNORMAL LOW (ref 7.350–7.450)
pH, Arterial: 7.158 — CL (ref 7.350–7.450)
pO2, Arterial: 76.3 mmHg — ABNORMAL LOW (ref 80.0–100.0)
pO2, Arterial: 365 mmHg — ABNORMAL HIGH (ref 80.0–100.0)

## 2015-05-14 LAB — CBC
HCT: 34.1 % — ABNORMAL LOW (ref 36.0–46.0)
Hemoglobin: 10.2 g/dL — ABNORMAL LOW (ref 12.0–15.0)
MCH: 30.2 pg (ref 26.0–34.0)
MCHC: 29.9 g/dL — ABNORMAL LOW (ref 30.0–36.0)
MCV: 100.9 fL — ABNORMAL HIGH (ref 78.0–100.0)
Platelets: 233 10*3/uL (ref 150–400)
RBC: 3.38 MIL/uL — ABNORMAL LOW (ref 3.87–5.11)
RDW: 17.2 % — ABNORMAL HIGH (ref 11.5–15.5)
WBC: 6 10*3/uL (ref 4.0–10.5)

## 2015-05-14 LAB — COMPREHENSIVE METABOLIC PANEL
ALT: 15 U/L (ref 14–54)
AST: 19 U/L (ref 15–41)
Albumin: 2.7 g/dL — ABNORMAL LOW (ref 3.5–5.0)
Alkaline Phosphatase: 56 U/L (ref 38–126)
Anion gap: 5 (ref 5–15)
BUN: 59 mg/dL — ABNORMAL HIGH (ref 6–20)
CO2: 32 mmol/L (ref 22–32)
Calcium: 8.8 mg/dL — ABNORMAL LOW (ref 8.9–10.3)
Chloride: 98 mmol/L — ABNORMAL LOW (ref 101–111)
Creatinine, Ser: 2.62 mg/dL — ABNORMAL HIGH (ref 0.44–1.00)
GFR calc Af Amer: 21 mL/min — ABNORMAL LOW (ref >60)
GFR calc non Af Amer: 18 mL/min — ABNORMAL LOW (ref >60)
Glucose, Bld: 106 mg/dL — ABNORMAL HIGH (ref 65–99)
Potassium: 5.1 mmol/L (ref 3.5–5.1)
Sodium: 135 mmol/L (ref 135–145)
Total Bilirubin: 0.6 mg/dL (ref 0.3–1.2)
Total Protein: 5.5 g/dL — ABNORMAL LOW (ref 6.5–8.1)

## 2015-05-14 LAB — PROCALCITONIN: Procalcitonin: 0.11 ng/mL

## 2015-05-14 LAB — VALPROIC ACID LEVEL: VALPROIC ACID LVL: 38 ug/mL — AB (ref 50.0–100.0)

## 2015-05-14 LAB — TROPONIN I

## 2015-05-14 LAB — GLUCOSE, CAPILLARY
GLUCOSE-CAPILLARY: 62 mg/dL — AB (ref 65–99)
GLUCOSE-CAPILLARY: 67 mg/dL (ref 65–99)
GLUCOSE-CAPILLARY: 81 mg/dL (ref 65–99)
Glucose-Capillary: 88 mg/dL (ref 65–99)
Glucose-Capillary: 77 mg/dL (ref 65–99)

## 2015-05-14 LAB — TSH: TSH: 8.887 u[IU]/mL — ABNORMAL HIGH (ref 0.350–4.500)

## 2015-05-14 LAB — HIV ANTIBODY (ROUTINE TESTING W REFLEX): HIV SCREEN 4TH GENERATION: NONREACTIVE

## 2015-05-14 LAB — LACTIC ACID, PLASMA
Lactic Acid, Venous: 0.4 mmol/L — ABNORMAL LOW (ref 0.5–2.0)
Lactic Acid, Venous: 0.6 mmol/L (ref 0.5–2.0)

## 2015-05-14 MED ORDER — DEXTROSE 50 % IV SOLN
INTRAVENOUS | Status: AC
  Administered 2015-05-14 (×2): 25 mL
  Filled 2015-05-14: qty 50

## 2015-05-14 MED ORDER — MIDAZOLAM HCL 2 MG/2ML IJ SOLN
2.0000 mg | Freq: Once | INTRAMUSCULAR | Status: AC
  Administered 2015-05-14: 2 mg via INTRAVENOUS

## 2015-05-14 MED ORDER — ROCURONIUM BROMIDE 50 MG/5ML IV SOLN
5.0000 mg | Freq: Once | INTRAVENOUS | Status: AC
Start: 2015-05-14 — End: 2015-05-14
  Administered 2015-05-14: 5 mg via INTRAVENOUS
  Filled 2015-05-14: qty 0.5

## 2015-05-14 MED ORDER — MIDAZOLAM HCL 2 MG/2ML IJ SOLN
INTRAMUSCULAR | Status: AC
  Filled 2015-05-14: qty 2

## 2015-05-14 MED ORDER — MIDAZOLAM HCL 2 MG/2ML IJ SOLN: 2.0000 mg | INTRAMUSCULAR | Status: DC | PRN

## 2015-05-14 MED ORDER — LAMOTRIGINE 100 MG PO TABS
100.0000 mg | ORAL_TABLET | Freq: Two times a day (BID) | ORAL | Status: DC
  Administered 2015-05-14 – 2015-05-21 (×15): 100 mg
  Filled 2015-05-14 (×17): qty 1

## 2015-05-14 MED ORDER — SODIUM CHLORIDE 0.9 % IV SOLN
500.0000 mg | Freq: Once | INTRAVENOUS | Status: AC
  Administered 2015-05-14: 500 mg via INTRAVENOUS
  Filled 2015-05-14: qty 500

## 2015-05-14 MED ORDER — DEXTROSE 5 % IV SOLN
0.0000 ug/min | INTRAVENOUS | Status: DC
  Administered 2015-05-14: 7 ug/min via INTRAVENOUS
  Administered 2015-05-14: 5 ug/min via INTRAVENOUS
  Administered 2015-05-14 – 2015-05-15 (×2): 10 ug/min via INTRAVENOUS
  Administered 2015-05-15 (×2): 16 ug/min via INTRAVENOUS
  Administered 2015-05-15: 14 ug/min via INTRAVENOUS
  Administered 2015-05-15: 12 ug/min via INTRAVENOUS
  Administered 2015-05-16 – 2015-05-17 (×2): 5 ug/min via INTRAVENOUS
  Filled 2015-05-14 (×12): qty 4

## 2015-05-14 MED ORDER — ASPIRIN 81 MG PO CHEW
81.0000 mg | CHEWABLE_TABLET | Freq: Every day | ORAL | Status: DC
  Administered 2015-05-14 – 2015-05-21 (×8): 81 mg
  Filled 2015-05-14 (×8): qty 1

## 2015-05-14 MED ORDER — FENTANYL CITRATE (PF) 100 MCG/2ML IJ SOLN
INTRAMUSCULAR | Status: AC
  Administered 2015-05-14: 100 ug via INTRAVENOUS
  Filled 2015-05-14: qty 2

## 2015-05-14 MED ORDER — SODIUM CHLORIDE 0.9 % IV SOLN
250.0000 mg | Freq: Four times a day (QID) | INTRAVENOUS | Status: DC
  Administered 2015-05-14 – 2015-05-15 (×4): 250 mg via INTRAVENOUS
  Filled 2015-05-14 (×5): qty 250

## 2015-05-14 MED ORDER — ETOMIDATE 2 MG/ML IV SOLN
20.0000 mg | Freq: Once | INTRAVENOUS | Status: AC
  Administered 2015-05-14: 20 mg via INTRAVENOUS
  Filled 2015-05-14: qty 10

## 2015-05-14 MED ORDER — VANCOMYCIN HCL 10 G IV SOLR
1750.0000 mg | INTRAVENOUS | Status: DC
  Administered 2015-05-14 – 2015-05-16 (×3): 1750 mg via INTRAVENOUS
  Filled 2015-05-14: qty 750
  Filled 2015-05-14: qty 1750
  Filled 2015-05-14: qty 750

## 2015-05-14 MED ORDER — ENOXAPARIN SODIUM 100 MG/ML ~~LOC~~ SOLN
90.0000 mg | SUBCUTANEOUS | Status: DC
  Administered 2015-05-14 – 2015-05-19 (×6): 90 mg via SUBCUTANEOUS
  Filled 2015-05-14 (×9): qty 1

## 2015-05-14 MED ORDER — FENTANYL CITRATE (PF) 100 MCG/2ML IJ SOLN
100.0000 ug | Freq: Once | INTRAMUSCULAR | Status: AC
  Administered 2015-05-14: 100 ug via INTRAVENOUS

## 2015-05-14 MED ORDER — CETYLPYRIDINIUM CHLORIDE 0.05 % MT LIQD
7.0000 mL | Freq: Two times a day (BID) | OROMUCOSAL | Status: DC
  Administered 2015-05-14 – 2015-05-16 (×5): 7 mL via OROMUCOSAL

## 2015-05-14 MED ORDER — PANTOPRAZOLE SODIUM 40 MG IV SOLR
40.0000 mg | Freq: Every day | INTRAVENOUS | Status: DC
  Administered 2015-05-14 – 2015-05-16 (×3): 40 mg via INTRAVENOUS
  Filled 2015-05-14 (×3): qty 40

## 2015-05-14 MED ORDER — SODIUM CHLORIDE 0.9 % IV SOLN: INTRAVENOUS | Status: DC | PRN

## 2015-05-14 MED ORDER — SODIUM CHLORIDE 0.9 % IV BOLUS (SEPSIS)
1000.0000 mL | Freq: Once | INTRAVENOUS | Status: AC
  Administered 2015-05-14: 1000 mL via INTRAVENOUS

## 2015-05-14 MED ORDER — CLINDAMYCIN PHOSPHATE 300 MG/50ML IV SOLN
300.0000 mg | Freq: Four times a day (QID) | INTRAVENOUS | Status: DC
  Administered 2015-05-14: 300 mg via INTRAVENOUS
  Filled 2015-05-14 (×2): qty 50

## 2015-05-14 MED ORDER — SODIUM CHLORIDE 0.9 % IV BOLUS (SEPSIS)
500.0000 mL | Freq: Once | INTRAVENOUS | Status: AC
  Administered 2015-05-14: 500 mL via INTRAVENOUS

## 2015-05-14 MED ORDER — FENTANYL BOLUS VIA INFUSION
50.0000 ug | INTRAVENOUS | Status: DC | PRN
  Filled 2015-05-14: qty 50

## 2015-05-14 MED ORDER — LEVOTHYROXINE SODIUM 100 MCG IV SOLR
75.0000 ug | Freq: Every day | INTRAVENOUS | Status: DC
Start: 2015-05-14 — End: 2015-05-17
  Administered 2015-05-14 – 2015-05-16 (×3): 75 ug via INTRAVENOUS
  Filled 2015-05-14 (×4): qty 5

## 2015-05-14 MED ORDER — FENTANYL CITRATE (PF) 100 MCG/2ML IJ SOLN: 50.0000 ug | Freq: Once | INTRAMUSCULAR | Status: DC

## 2015-05-14 MED ORDER — ALBUTEROL SULFATE (2.5 MG/3ML) 0.083% IN NEBU: 2.5000 mg | INHALATION_SOLUTION | RESPIRATORY_TRACT | Status: DC | PRN

## 2015-05-14 MED ORDER — SODIUM CHLORIDE 0.9 % IV SOLN
250.0000 mg | Freq: Four times a day (QID) | INTRAVENOUS | Status: DC
  Filled 2015-05-14: qty 250

## 2015-05-14 MED ORDER — CHLORHEXIDINE GLUCONATE 0.12 % MT SOLN
15.0000 mL | Freq: Two times a day (BID) | OROMUCOSAL | Status: DC
  Administered 2015-05-14 – 2015-05-15 (×4): 15 mL via OROMUCOSAL
  Filled 2015-05-14 (×4): qty 15

## 2015-05-14 MED ORDER — VANCOMYCIN HCL IN DEXTROSE 1-5 GM/200ML-% IV SOLN
1000.0000 mg | Freq: Once | INTRAVENOUS | Status: AC
  Administered 2015-05-14: 1000 mg via INTRAVENOUS
  Filled 2015-05-14: qty 200

## 2015-05-14 MED ORDER — IPRATROPIUM-ALBUTEROL 0.5-2.5 (3) MG/3ML IN SOLN: 3.0000 mL | Freq: Four times a day (QID) | RESPIRATORY_TRACT | Status: DC | PRN

## 2015-05-14 MED ORDER — SODIUM CHLORIDE 0.9 % IV SOLN
25.0000 ug/h | INTRAVENOUS | Status: DC
  Administered 2015-05-14: 50 ug/h via INTRAVENOUS
  Administered 2015-05-15: 100 ug/h via INTRAVENOUS
  Filled 2015-05-14 (×3): qty 50

## 2015-05-14 MED ORDER — VALPROATE SODIUM 500 MG/5ML IV SOLN
1250.0000 mg | Freq: Every day | INTRAVENOUS | Status: DC
  Administered 2015-05-14 – 2015-05-16 (×3): 1250 mg via INTRAVENOUS
  Filled 2015-05-14 (×2): qty 12.5
  Filled 2015-05-14: qty 5

## 2015-05-14 NOTE — Progress Notes (Signed)
Notified E-Link of pt's decreased BP and MAP.  E-Link stated to continue to titrate Levophed to achieve desired MAP.  Levophed currently at 10 mcg with MAP increased to 63.  Will continue to monitor pt.

## 2015-05-14 NOTE — Procedures (Signed)
Arterial Catheter Insertion Procedure Note OLIVYA JIM [id number] [birth year]  Procedure: Insertion of Arterial Catheter  Indications: Blood pressure monitoring  Procedure Details Consent: Unable to obtain consent because of altered level of consciousness. Time Out: Verified patient identification, verified procedure, site/side was marked, verified correct patient position, special equipment/implants available, medications/allergies/relevent history reviewed, required imaging and test results available.  Performed  Maximum sterile technique was used including antiseptics, gloves, gown, hand hygiene, mask and sheet. Skin prep: Chlorhexidine; local anesthetic administered 20 gauge catheter was inserted into the right radial artery using the Seldinger technique.  Evaluation Blood flow good; BP tracing good. Complications: No apparent complications.   Carney Harder 05/14/2015

## 2015-05-14 NOTE — Progress Notes (Signed)
Called to patient room for Rapid Response and STAT ABG. Analysis of blood shows panic results. Reported to Verl Blalock, RN with Rapid Response. BiPAP ordered per rapid response protocol. ABG one hour after BiPAP also ordered per protocol.

## 2015-05-14 NOTE — Progress Notes (Signed)
Last night, patient's BP was 82/49, NP was notified and orders were given for a 500cc bolus, once that finished, BP was taken again and it was 80/48. NP was notified again and new orders were given for another 500cc bolus of NS and also to change patient's antibiotics to IV. After the second bolus finished, a manual pressure was taken and it was 80/40. Rapid response team was called and came up to see the patient. RRT started another bolus and gave orders for ABG. When the ABG results came back, NP was up on the floor to see the patient. ABG results came back abnormal and the decision was made to transfer to stepdown. Patient's belongings were put in bags and RN gave report to the ICU RN getting the patient. Patient was transferred downstairs with rapid response RN, and NP.

## 2015-05-14 NOTE — Procedures (Signed)
Central Venous Catheter Insertion Procedure Note [name] [id number] 1950/09/02  Procedure: Insertion of Central Venous Catheter Indications: Assessment of intravascular volume, Drug and/or fluid administration and Frequent blood sampling  Procedure Details Consent: Unable to obtain consent because of altered level of consciousness. Time Out: Verified patient identification, verified procedure, site/side was marked, verified correct patient position, special equipment/implants available, medications/allergies/relevent history reviewed, required imaging and test results available.  Performed  Maximum sterile technique was used including antiseptics, cap, gloves, gown, hand hygiene, mask and sheet. Skin prep: Chlorhexidine; local anesthetic administered A antimicrobial bonded/coated triple lumen catheter was placed in the left subclavian vein using the Seldinger technique.  Evaluation Blood flow good Complications: No apparent complications Patient did tolerate procedure well. Chest X-ray ordered to verify placement.  CXR: pending.  [name] 05/14/2015, 5:40 AM

## 2015-05-14 NOTE — Consult Note (Signed)
PULMONARY / CRITICAL CARE MEDICINE   Name: [name] MRN: [id number] DOB: 1950/07/14    ADMISSION DATE:  05/13/2015 CONSULTATION DATE:  06/12/15  REFERRING MD :  Izola Price  CHIEF COMPLAINT:  AMS  HISTORY OF PRESENT ILLNESS:  Pt is encephalopathic; therefore, this HPI is obtained from chart review. [name] is a 64 y.o. female with a PMH as outlined below who resides at [facility].  She presented to [facility] ED 11/23 due to right breast area pain and swelling.  She was apparently quite agitated at the time of admission to the point that she was unable to provide any meaningful information / history.  She was admitted for AMS / acute encephalopathy of unclear etiology but possibly due to schizoaffective disorder, bipolar disorder, AKI with hyperkalemia, dehydration, cellulitis of right breast.  Later that evening, she became hypoxic and later more altered.  ABG revealed respiratory acidosis for which she was transferred to SDU.  Repeat ABG later was worse; therefore, she was placed on BiPAP.  She ultimately failed BiPAP and PCCM was consulted for further recs.  PAST MEDICAL HISTORY :  She  has a past medical history of Bipolar disorder (HCC); Schizoaffective disorder (HCC); Mood disorder (HCC); Epilepsy (HCC); Thyroid disease; Hiatal hernia; Atrial flutter (HCC); Schizoaffective disorder; Urinary tract infection; Hypertension; GERD (gastroesophageal reflux disease); Hypothyroidism; Obesity; Asthma; COPD (chronic obstructive pulmonary disease) (HCC); CHF (congestive heart failure) (HCC); Seizures (HCC); Morbid obesity (HCC); Chronic pain; Hypercholesteremia; Anginal pain (HCC); Myocardial infarction [facility]) (2002); Diabetes mellitus; Anemia; History of blood transfusion (1993); H/O hiatal hernia; Daily headache; Arthritis; Hypoventilation syndrome; Atrial fibrillation (HCC); Atrial flutter, paroxysmal (HCC); Exertional dyspnea; Shortness of breath; Pneumonia; Anxiety; Episodic mood disorder (HCC);  Psychosis; Schizophrenia (HCC); RETARDATION, MENTAL, 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  PAST SURGICAL HISTORY: She  has past surgical history that includes Tubal ligation (1998); Cholecystectomy (?2008); Cardiac catheterization (2008); and Vaginal hysterectomy (1993).  Allergies  Allergen Reactions  . Food Rash    "lasagna"= rash from the noodles  . Penicillins Hives  . Penicillins     Per MAR  . Sulfa Antibiotics     Per MAR  . Sulfamethoxazole-Trimethoprim Hives and Nausea And Vomiting    No current facility-administered medications on file prior to encounter.   Current Outpatient Prescriptions on File Prior to Encounter  Medication Sig  . aspirin 81 MG chewable tablet Chew 81 mg by mouth daily.  . bisacodyl (DULCOLAX) 10 MG suppository Place 10 mg rectally every 3 (three) days as needed for mild constipation or moderate constipation.  . divalproex (DEPAKOTE ER) 250 MG 24 hr tablet Take 1,250 mg by mouth at bedtime.  . furosemide (LASIX) 20 MG tablet Take 1 tablet (20 mg total) by mouth daily.  Marland Kitchen lamoTRIgine (LAMICTAL) 100 MG tablet Take 100 mg by mouth 2 (two) times daily. For mood disorder  . levothyroxine (SYNTHROID, LEVOTHROID) 150 MCG tablet Take 150 mcg by mouth every morning. For thyroid disease.  Marland Kitchen lisinopril (PRINIVIL,ZESTRIL) 5 MG tablet Take 1 tablet (5 mg total) by mouth daily.  Marland Kitchen LORazepam (ATIVAN) 0.5 MG tablet Take one tablet by mouth three times daily as needed for anxiety  . magnesium hydroxide (MILK OF MAGNESIA) 400 MG/5ML suspension Take 30 mLs by mouth daily as needed for mild constipation.  Marland Kitchen omeprazole (PRILOSEC) 40 MG capsule Take 40 mg by mouth daily.  . chlorproMAZINE (THORAZINE) 25 MG tablet Take 1-4 tablets (25-100 mg total) by mouth 3 (three) times daily. Give 50 mg at 12 pm  50 mg at 6 pm 100 mg nightly  . [DISCONTINUED] ferrous sulfate 325 (65 FE) MG tablet Take 325 mg by mouth 2 (two) times daily.    FAMILY HISTORY:  Her indicated  that her mother is deceased. She indicated that her father is alive.   SOCIAL HISTORY: She  reports that she quit smoking about 14 years ago. Her smoking use included Cigarettes. She has a 24 pack-year smoking history. Her smokeless tobacco use includes Chew. She reports that she does not drink alcohol or use illicit drugs.  REVIEW OF SYSTEMS:  Unable to obtain as pt is encephalopathic.  SUBJECTIVE:   VITAL SIGNS: BP 79/47 mmHg  Pulse 86  Temp(Src) 98.8 F (37.1 C) (Oral)  Resp 27  Ht  (1.575 m)  Wt 178.627 kg (393 lb 12.8 oz)  BMI 72.01 kg/m2  SpO2 100%  HEMODYNAMICS:    VENTILATOR SETTINGS: Vent Mode:  [-] BIPAP;PCV FiO2 (%):  [100 %] 100 % Set Rate:  [20 bmp] 20 bmp PEEP:  [6 cmH20] 6 cmH20  INTAKE / OUTPUT: I/O last 3 completed shifts: In: 268.8 [P.O.:120; I.V.:148.8] Out: 3 [Urine:3]  PHYSICAL EXAMINATION: General: Obese female, unresponsive. Neuro: Non-responsive.  Does not follow any commands. HEENT: [place]/AT. PERRL, sclerae anicteric. Cardiovascular: RRR, no M/R/G appreciated with respect to body habitus. Lungs: Respirations even and unlabored.  Coarse bilaterally. Abdomen: Obese, BS x 4, soft, NT/ND.  Musculoskeletal: No gross deformities, no edema.  Skin: Intact, warm, no rashes.  LABS:  CBC  Recent Labs Lab 05/13/15 1257 05/14/15 0405  WBC 6.6 6.0  HGB 11.2* 10.2*  HCT 37.0 34.1*  PLT 249 233   Coag's No results for input(s): APTT, INR in the last 168 hours. BMET  Recent Labs Lab 05/13/15 1257 05/14/15 0405  NA 132* 135  K 5.3* 5.1  CL 92* 98*  CO2 33* 32  BUN 60* 59*  CREATININE 2.93* 2.62*  GLUCOSE 100* 106*   Electrolytes  Recent Labs Lab 05/13/15 1257 05/13/15 2115 05/14/15 0405  CALCIUM 9.6  --  8.8*  MG  --  1.9  --   PHOS  --  3.9  --    Sepsis Markers  Recent Labs Lab 05/13/15 1304 05/14/15 0405  LATICACIDVEN 0.68 0.4*   ABG  Recent Labs Lab 05/14/15 0155 05/14/15 0343  PHART 7.201* 7.158*  PCO2ART  82.1* 88.1*  PO2ART 76.3* 365*   Liver Enzymes  Recent Labs Lab 05/14/15 0405  AST 19  ALT 15  ALKPHOS 56  BILITOT 0.6  ALBUMIN 2.7*   Cardiac Enzymes No results for input(s): TROPONINI, PROBNP in the last 168 hours. Glucose No results for input(s): GLUCAP in the last 168 hours.  Imaging Dg Chest Port 1 View  05/13/2015  CLINICAL DATA:  Hypoxia. Diabetes. Shortness of breath. Former smoker for 14 years. EXAM: PORTABLE CHEST 1 VIEW COMPARISON:  05/23/2013 FINDINGS: Shallow inspiration. Cardiac enlargement without significant vascular congestion. Mild volume loss in the right upper lung is probably due to shallow inspiration. No focal consolidation or airspace disease is appreciated. No blunting of costophrenic angles. No pneumothorax. Mediastinal contours appear intact. IMPRESSION: Cardiac enlargement.  Shallow inspiration.  No focal consolidation. Electronically Signed   By: Burman Nieves M.D.   On: 05/13/2015 20:06     STUDIES:  CXR 11/23 > shallow inspiration.  CULTURES: Blood 11/23 > Urine 11/23 >  ANTIBIOTICS: Clinda 11/23 > 11/24 Vanc 11/23 > Imipenem 11/24 >>>  SIGNIFICANT EVENTS: 11/23 > admitted with AMS. 11/24 >  transferred to SDU with worsening mental status.  Required intubation after failing BiPAP.  LINES/TUBES: ETT 11/24 > CVL L [place] 11/24 > R radial aline 11/23 >   ASSESSMENT / PLAN:  PULMONARY OETT 11/24 > A: Acute hypercarbic respiratory failure - failed BiPAP and required intubation 11/24. Probable OSA / OHS. Former smoker. P:   Full vent support. Repeat ABG in 1 hour. Wean as able. VAP prevention measures. SBT when able. DuoNebs / Albuterol. CXR in AM.  NEUROLOGIC A:   Acute metabolic encephalopathy - multifactorial in the setting of hypercarbia, sepsis. Hx anxiety, psychosis, schizophrenia, mild mental retardation. P:   Sedation:  Fentanyl gtt / Midazolam PRN. RASS goal: 0 to -1. Daily WUA. Continue outpatient depakote  (change to IV), lamictal. Assess levels of above. Hold outpatient chlorpromazine, lorazepam.  INFECTIOUS A:   Shock  - suspect septic due to UTI and questionable right breast cellulitis. Lactate reassuring. P:   Abx: Vanc / Imipenem (d/c clinda). Follow cultures. PCT algorithm to limit abx exposure.  CARDIOVASCULAR CVL L  11/24 > R radial aline 11/23 > A:  Shock  - suspect septic due to UTI and questionable right breast cellulitis. Lactate reassuring. Hx HLD, MI, A.fib, PAF, combined heart failure (echo from Oct 2013 with EF 50-55%, grade 1 DD). P:  Norepinephrine as needed to maintain goal MAP > 65. Goal CVP 8 - 12. Repeat lactate. Assess troponin. Echo. Continue outpatient ASA. Hold outpatient furosemide, lisinopril.  RENAL A:   AKI - likely pre-renal from hypovolemia. Pseudohypocalcemia - corrects to 9.84. P:   NS @ 125. Assess ionized calcium. BMP in AM.  GASTROINTESTINAL A:   GI prophylaxis. Nutrition. Morbid obesity. P:   SUP: Pantoprazole. NPO. Start TF's.  HEMATOLOGIC A:   Mild anemia - chronic. VTE Prophylaxis. P:  Transfuse for Hgb < 7. SCD's / Lovenox. CBC in AM.  ENDOCRINE A:   Hx DM - not on meds. Hypothyroidism. P:   Assess Hgb A1c. SSI if glucose consistently > 180. Continue outpatient synthroid, change to IV formulation. Assess TSH.   Family updated: None.  Interdisciplinary Family Meeting v Palliative Care Meeting:  Due by: 11/30.   Rutherford Guys, [place] - [facility] [place] [facility] Pager: [phone]  or [phone] 05/14/2015, 4:48 AM   Attending Note:  64 year old morbidly obese female with COPD, OHV and OSA presenting with left breast cellulitis and AMS. The patient was noted to be in renal failure as well and in hypercarbic respiratory failure. Patient is full code status. Patient was started on BiPAP but failed with worsening pH and CO2 retention. On the time of my evaluation the patient  was completely unresponsive so no further history beyond medical records was available. Patient was admitted by [facility] and started on clindamycin for cellulitis. However, due to worsening mental status, hypercarbic respiratory failure and septic shock (presumed) PCCM was called on consultation. On exam, patient is obtunded and has virtually no BS on exam. I reviewed her CXR myself noted cardiomegally and low lung volumes.   Plan: - Intubate. - Mechanically ventilate. - Change clinda to vanc/zosyn. - Place TLC. - Follow CVP. - CXR and ABG post intubation. - Adjust vent for ABG. - Start levophed for BP support. - Pending ABG will determine need for IVF. - Start TF in AM. - Versed PRN and fentanyl drip for sedation. - No need for bicarb drip. - Kayexalate post intubation. - Place OGT.  The patient  is critically ill with multiple organ systems failure and requires high complexity decision making for assessment and support, frequent evaluation and titration of therapies, application of advanced monitoring technologies and extensive interpretation of multiple databases.   Critical Care Time devoted to patient care services described in this note is60 Minutes. This time reflects time of care of this signee Dr [name]. This critical care time does not reflect procedure time, or teaching time or supervisory time of PA/NP/Med student/Med Resident etc but could involve care discussion time.  Alyson Reedy, M.D. [facility] Pulmonary/Critical Care Medicine. Pager: [phone]. After hours pager: [phone].

## 2015-05-14 NOTE — Procedures (Signed)
NGT Placement Under Direct Laryngoscopic Visualization  After intubation while laryngoscope is there OGT was passed and visualized directly going into the esophagus. Confirmed by auscultation and xray after.  Alyson Reedy, M.D. [facility] Pulmonary/Critical Care Medicine. Pager: [phone]. After hours pager: [phone].

## 2015-05-14 NOTE — Progress Notes (Signed)
  Echocardiogram 2D Echocardiogram has been performed.  Delcie Roch 05/14/2015, 9:04 AM

## 2015-05-14 NOTE — Progress Notes (Signed)
CRITICAL VALUE ALERT  Critical value received:  CBG 62  Date of notification:  05/14/15  Time of notification:  2101   Critical value read back:Yes.    Nurse who received alert:  Shanda Bumps  MD notified (1st page):  E-Link    Time of first page:  2115  Responding MD:  E-Link  Time MD responded:  2115

## 2015-05-14 NOTE — Progress Notes (Signed)
CRITICAL VALUE ALERT  Critical value received:  CBG 68  Date of notification:  05/14/15   Time of notification:  2201   Critical value read back:Yes.     Recheck Value: CBG 81 at 2229  Nurse who received alert:  Shanda Bumps  MD notified (1st page):  E-Link  Time of first page:  2230 - E-Link stated patient looked comfortable and we will continue to monitor.

## 2015-05-14 NOTE — Progress Notes (Signed)
ANTIBIOTIC CONSULT NOTE - INITIAL  Pharmacy Consult for Vancomycin, primaxin Indication: Cellulitis/UTI/Sepsis  Allergies  Allergen Reactions  . Food Rash    "lasagna"= rash from the noodles  . Penicillins Hives  . Penicillins     Per MAR  . Sulfa Antibiotics     Per MAR  . Sulfamethoxazole-Trimethoprim Hives and Nausea And Vomiting    Patient Measurements: Height: 5\' 2"  (157.5 cm) Weight: (!) 393 lb 12.8 oz (178.627 kg) IBW/kg (Calculated) : 50.1 Adjusted Body Weight:   Vital Signs: Temp: 98.8 F (37.1 C) (11/24 0300) Temp Source: Oral (11/24 0300) BP: 107/52 mmHg (11/24 0526) Pulse Rate: 86 (11/24 0526) Intake/Output from previous day: 11/23 0701 - 11/24 0700 In: 268.8 [P.O.:120; I.V.:148.8] Out: 3 [Urine:3] Intake/Output from this shift:    Labs:  Recent Labs  05/13/15 1257 05/14/15 0405  WBC 6.6 6.0  HGB 11.2* 10.2*  PLT 249 233  CREATININE 2.93* 2.62*   Estimated Creatinine Clearance: 35.7 mL/min (by C-G formula based on Cr of 2.62). No results for input(s): VANCOTROUGH, VANCOPEAK, VANCORANDOM, GENTTROUGH, GENTPEAK, GENTRANDOM, TOBRATROUGH, TOBRAPEAK, TOBRARND, AMIKACINPEAK, AMIKACINTROU, AMIKACIN in the last 72 hours.   Microbiology: Recent Results (from the past 720 hour(s))  MRSA PCR Screening     Status: Abnormal   Collection Time: 05/13/15  7:00 PM  Result Value Ref Range Status   MRSA by PCR INVALID RESULTS, SPECIMEN SENT FOR CULTURE (A) NEGATIVE Final    Comment: RESULT CALLED TO, READ BACK BY AND VERIFIED WITH: Sheppard Penton [id number] @ 2123 BY J SCOTTON        The GeneXpert MRSA Assay (FDA approved for NASAL specimens only), is one component of a comprehensive MRSA colonization surveillance program. It is not intended to diagnose MRSA infection nor to guide or monitor treatment for MRSA infections.     Medical History: Past Medical History  Diagnosis Date  . Bipolar disorder (HCC)   . Schizoaffective disorder (HCC)   . Mood  disorder (HCC)   . Epilepsy (HCC)   . Thyroid disease   . Hiatal hernia   . Atrial flutter (HCC)   . Schizoaffective disorder   . Urinary tract infection   . Hypertension   . GERD (gastroesophageal reflux disease)   . Hypothyroidism   . Obesity   . Asthma   . COPD (chronic obstructive pulmonary disease) (HCC)   . CHF (congestive heart failure) (HCC)   . Seizures (HCC)   . Morbid obesity (HCC)   . Chronic pain   . Hypercholesteremia   . Anginal pain (HCC)   . Myocardial infarction (HCC) 2002  . Diabetes mellitus     "I'm not diabetic anymore" (05/23/2012)  . Anemia   . History of blood transfusion 1993    "when I had my hysterectomy" (05/23/2012)  . H/O hiatal hernia   . Daily headache   . Arthritis     "severe; all over my body" (05/23/2012)  . Hypoventilation syndrome     [name] 05/30/2012  . Atrial fibrillation (HCC)   . Atrial flutter, paroxysmal (HCC)     [name] 05/30/2012  . Exertional dyspnea   . Shortness of breath     "all the time lately" (05/30/2012)  . Pneumonia     "several times; including now" (04/23/2013)  . Anxiety   . Episodic mood disorder (HCC)   . Psychosis   . Schizophrenia (HCC)   . 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  CHOLELITHIASIS 11/30/2009    Qualifier: Diagnosis of  By: Huntley Dec, [place]      Medications:  Anti-infectives    Start     Dose/Rate Route Frequency Ordered Stop   05/14/15 1800  vancomycin (VANCOCIN) 1,750 mg in sodium chloride 0.9 % 500 mL IVPB     1,750 mg 250 mL/hr over 120 Minutes Intravenous Every 24 hours 05/14/15 0603     05/14/15 1200  imipenem-cilastatin (PRIMAXIN) 250 mg in sodium chloride 0.9 % 100 mL IVPB     250 mg 200 mL/hr over 30 Minutes Intravenous 4 times per day 05/14/15 0603     05/14/15 0545  imipenem-cilastatin (PRIMAXIN) 500 mg in sodium chloride 0.9 % 100 mL IVPB     500 mg 200 mL/hr over 30 Minutes Intravenous  Once 05/14/15 0530      05/14/15 0545  vancomycin (VANCOCIN) IVPB 1000 mg/200 mL premix     1,000 mg 200 mL/hr over 60 Minutes Intravenous  Once 05/14/15 0530     05/14/15 0215  clindamycin (CLEOCIN) IVPB 300 mg  Status:  Discontinued     300 mg 100 mL/hr over 30 Minutes Intravenous 4 times per day 05/14/15 0204 05/14/15 0517   05/13/15 1800  clindamycin (CLEOCIN) capsule 300 mg  Status:  Discontinued     300 mg Oral 4 times per day 05/13/15 1658 05/14/15 0024   05/13/15 1600  clindamycin (CLEOCIN) IVPB 600 mg  Status:  Discontinued     600 mg 100 mL/hr over 30 Minutes Intravenous Every 8 hours 05/13/15 1518 05/13/15 1659   05/13/15 1315  vancomycin (VANCOCIN) IVPB 1000 mg/200 mL premix     1,000 mg 200 mL/hr over 60 Minutes Intravenous  Once 05/13/15 1312 05/13/15 1418     Assessment: Patient from SNF with complaint of right breast area pain and swelling.  Pt agitated, AMS, acute encephalopathy.  Abx for cellulitis of right breast/UTI/sepsis.  In ED, Pt became hypoxic and therefore transfer to SDU. First dose of antibiotics already ordered.   Goal of Therapy:  Vancomycin trough level 15-20 mcg/ml  Primaxin dosed based on patient weight and renal function Appropriate antibiotic dosing for renal function; eradication of infection   Plan:  Measure antibiotic drug levels at steady state Follow up culture results Vancomycin 1gm x1 then  iv q24hr  Primaxin  iv q6hr  Darlina Guys, Avyukt Cimo Crowford 05/14/2015,6:10 AM

## 2015-05-14 NOTE — Progress Notes (Signed)
Dr. Marchelle Gearing notified of post blood cultures. Pt on Vancomycin

## 2015-05-14 NOTE — Progress Notes (Signed)
Shift event note:  Notified x 2 regarding increasingly low BP that has been refractory to IVF boluses. RR RN was paged and responded to bedside. He reports pt quite lethargic as well and an ABG was obtained. It had been noted that pt had rec'd Ativan 3 mg IV just prior to this shift and sedation has presumably been related to medication. At bedside pt noted quite lethargic. Will open eyes to voice but does not track and speaks but words are incomprehensible. There is a h/o mr so unsure of pt's baseline.  GCS 10. Pt noted w/ shallow respirations w/ rate of 17-20. 02 sats have been 90-95% on R/A. BBS diminished. ABG reveals pH-7.2, pC02-82, p02-76.3 w/ bicarb of 31.1.  Assessment/Plan: 1. Acute (on chronic) hypercarbic respiratory failure in setting of COPD, OSA/OHS. Pt transferred to SDU and placed on BiPAP. Will repeat ABG at 0430. 2. Hypotension: Question sepsis in setting of (R) breast cellulitis and UTI. Initial lactic acid WNL Will repeat. H/o CHF (EF in 2013 50-55%). Will place a-line as unclear of accuracy of BP's given pt's body habitus. 1 L NS bolus infusing (3rd tonight). May need pressors.  Discussed pt w/ Dr Sherrie George w/ [facility] who has agreed to monitor pt. Will continue to monitor closely in SDU.  Leanne Chang, NP-C [facility] Pager [phone]

## 2015-05-14 NOTE — Procedures (Signed)
Intubation Procedure Note [name] [id number] [birth year]  Procedure: Intubation Indications: Respiratory insufficiency  Procedure Details Consent: Unable to obtain consent because of altered level of consciousness. Time Out: Verified patient identification, verified procedure, site/side was marked, verified correct patient position, special equipment/implants available, medications/allergies/relevent history reviewed, required imaging and test results available.  Performed  Maximum sterile technique was used including gloves, hand hygiene and mask.  MAC    Evaluation Hemodynamic Status: BP stable throughout; O2 sats: stable throughout Patient's Current Condition: stable Complications: No apparent complications Patient did tolerate procedure well. Chest X-ray ordered to verify placement.  CXR: pending.   [name] 05/14/2015

## 2015-05-14 NOTE — Significant Event (Signed)
Rapid Response Event Note  Overview: Time Called: 0140 Arrival Time: 0150 Event Type: Respiratory, Hypotension, Neurologic  Initial Focused Assessment:  Obese pt laying in bed, staff moving pt's arms around in order to get better placement of BP cuff, pt's arms are flaccid.  Pt unresponsive to verbal or tactile stimuli.  Staff advised pt has already received NS bolus x 2 for low BP.  Due to pt size of arms unsure if BP is accurate.  Sternal rub performed by me and pt opened eyes and attempted to be verbal.  Medical history reviewed, pt's verbal baseline is garbled speech.  Pt moved all extremities.  Recent CBG had been done which was advised was in the 80s.  ABG ordered by rapid response and done by RT.  Second liter bolus also ordered by rapid response and started.  Tama Gander, NP paged and was at bedside at 0215, which at that time ABG resulted.  Results given to [facility].  Natalia Leatherwood and I (rapid response RN) agreed transfer to [facility] unit was warranted for Bipap and close monitoring of BP, possible A-line.  Pt transferred to stepdown unit rm 1222, with rapid response monitoring and O2 at 2L-[place].  Once in stepdown unit:  RT applied Bipap; SBP 80s.  Rollen Sox ordered A-line.  Once A-line in SBP 115.  Interventions:  ABG: 1L-NS Bolus; transfer to Stepdown; Bipap; A-line  Event Summary:  Name of Physician Notified: Rollen Sox at 0215  Outcome: Transferred (Comment) (transferred to SD for Bipap and close monitoring of BP)  Event End Time: 0240  Roque Cash

## 2015-05-15 ENCOUNTER — Inpatient Hospital Stay (HOSPITAL_COMMUNITY): Payer: Medicare Other

## 2015-05-15 DIAGNOSIS — N179 Acute kidney failure, unspecified: Secondary | ICD-10-CM

## 2015-05-15 LAB — CBC
HEMATOCRIT: 33.8 % — AB (ref 36.0–46.0)
Hemoglobin: 10.2 g/dL — ABNORMAL LOW (ref 12.0–15.0)
MCH: 29.5 pg (ref 26.0–34.0)
MCHC: 30.2 g/dL (ref 30.0–36.0)
MCV: 97.7 fL (ref 78.0–100.0)
PLATELETS: 226 10*3/uL (ref 150–400)
RBC: 3.46 MIL/uL — ABNORMAL LOW (ref 3.87–5.11)
RDW: 17.1 % — AB (ref 11.5–15.5)
WBC: 6.7 10*3/uL (ref 4.0–10.5)

## 2015-05-15 LAB — BASIC METABOLIC PANEL
ANION GAP: 7 (ref 5–15)
Anion gap: 9 (ref 5–15)
BUN: 40 mg/dL — AB (ref 6–20)
BUN: 34 mg/dL — AB (ref 6–20)
CHLORIDE: 101 mmol/L (ref 101–111)
CHLORIDE: 103 mmol/L (ref 101–111)
CO2: 27 mmol/L (ref 22–32)
CO2: 26 mmol/L (ref 22–32)
CREATININE: 1.17 mg/dL — AB (ref 0.44–1.00)
Calcium: 9 mg/dL (ref 8.9–10.3)
Calcium: 8.9 mg/dL (ref 8.9–10.3)
Creatinine, Ser: 1.06 mg/dL — ABNORMAL HIGH (ref 0.44–1.00)
GFR calc Af Amer: 57 mL/min — ABNORMAL LOW (ref >60)
GFR calc Af Amer: >60 mL/min (ref >60)
GFR calc non Af Amer: 49 mL/min — ABNORMAL LOW (ref >60)
GFR, EST NON AFRICAN AMERICAN: 55 mL/min — AB (ref >60)
Glucose, Bld: 100 mg/dL — ABNORMAL HIGH (ref 65–99)
Glucose, Bld: 84 mg/dL (ref 65–99)
POTASSIUM: 4.2 mmol/L (ref 3.5–5.1)
POTASSIUM: 4.1 mmol/L (ref 3.5–5.1)
SODIUM: 136 mmol/L (ref 135–145)
Sodium: 137 mmol/L (ref 135–145)

## 2015-05-15 LAB — GLUCOSE, CAPILLARY
GLUCOSE-CAPILLARY: 146 mg/dL — AB (ref 65–99)
GLUCOSE-CAPILLARY: 78 mg/dL (ref 65–99)
Glucose-Capillary: 99 mg/dL (ref 65–99)
Glucose-Capillary: 94 mg/dL (ref 65–99)
Glucose-Capillary: 114 mg/dL — ABNORMAL HIGH (ref 65–99)

## 2015-05-15 LAB — CULTURE, BLOOD (ROUTINE X 2)

## 2015-05-15 LAB — HEMOGLOBIN A1C
Hgb A1c MFr Bld: 5.9 % — ABNORMAL HIGH (ref 4.8–5.6)
Mean Plasma Glucose: 123 mg/dL

## 2015-05-15 LAB — MRSA CULTURE

## 2015-05-15 LAB — PROCALCITONIN

## 2015-05-15 LAB — PHOSPHORUS: Phosphorus: 1.6 mg/dL — ABNORMAL LOW (ref 2.5–4.6)

## 2015-05-15 LAB — MAGNESIUM: Magnesium: 1.6 mg/dL — ABNORMAL LOW (ref 1.7–2.4)

## 2015-05-15 MED ORDER — MAGNESIUM SULFATE 4 GM/100ML IV SOLN
4.0000 g | Freq: Once | INTRAVENOUS | Status: AC
  Administered 2015-05-15: 4 g via INTRAVENOUS
  Filled 2015-05-15: qty 100

## 2015-05-15 MED ORDER — PRO-STAT SUGAR FREE PO LIQD
30.0000 mL | Freq: Three times a day (TID) | ORAL | Status: DC
  Administered 2015-05-15 – 2015-05-18 (×6): 30 mL
  Filled 2015-05-15 (×7): qty 30

## 2015-05-15 MED ORDER — COLLAGENASE 250 UNIT/GM EX OINT
TOPICAL_OINTMENT | Freq: Every day | CUTANEOUS | Status: DC
  Administered 2015-05-16 – 2015-05-17 (×2): via TOPICAL
  Administered 2015-05-18: 1 via TOPICAL
  Administered 2015-05-19: 18:00:00 via TOPICAL
  Administered 2015-05-21: 1 via TOPICAL
  Filled 2015-05-15: qty 30

## 2015-05-15 MED ORDER — POTASSIUM PHOSPHATES 15 MMOLE/5ML IV SOLN
24.0000 mmol | Freq: Once | INTRAVENOUS | Status: AC
  Administered 2015-05-15: 24 mmol via INTRAVENOUS
  Filled 2015-05-15: qty 8

## 2015-05-15 MED ORDER — SODIUM CHLORIDE 0.9 % IV SOLN
500.0000 mg | Freq: Three times a day (TID) | INTRAVENOUS | Status: DC
  Administered 2015-05-15 – 2015-05-17 (×6): 500 mg via INTRAVENOUS
  Filled 2015-05-15 (×8): qty 500

## 2015-05-15 MED ORDER — VITAL HIGH PROTEIN PO LIQD
1000.0000 mL | ORAL | Status: DC
  Administered 2015-05-15: 17:00:00
  Administered 2015-05-15: 1000 mL
  Administered 2015-05-15: 19:00:00
  Filled 2015-05-15 (×2): qty 1000

## 2015-05-15 NOTE — Consult Note (Signed)
PULMONARY / CRITICAL CARE MEDICINE   Name: [name] MRN: [id number] DOB: 20-Mar-1951    ADMISSION DATE:  05/13/2015 CONSULTATION DATE:  06/12/15  REFERRING MD :  Izola Price  CHIEF COMPLAINT:  AMS  BRIEF. [name] is a 64 y.o. female with a PMH as outlined below who resides at [facility]  She presented to [facility] ED 11/23 due to right breast area pain and swelling.  She was apparently quite agitated at the time of admission to the point that she was unable to provide any meaningful information / history. She was admitted for AMS / acute encephalopathy of unclear etiology but possibly due to schizoaffective disorder, bipolar disorder, AKI with hyperkalemia, dehydration, cellulitis of right breast. Later that evening, she became hypoxic and later more altered.  ABG revealed respiratory acidosis for which she was transferred to SDU.  Repeat ABG later was worse; therefore, she was placed on BiPAP.  She ultimately failed BiPAP and PCCM was consulted 112/4/16 AM for further recs.   has a past medical history of Bipolar disorder (HCC); Schizoaffective disorder (HCC); Mood disorder (HCC); Epilepsy (HCC); Thyroid disease; Hiatal hernia; Atrial flutter (HCC); Schizoaffective disorder; Urinary tract infection; Hypertension; GERD (gastroesophageal reflux disease); Hypothyroidism; Obesity; Asthma; COPD (chronic obstructive pulmonary disease) (HCC); CHF (congestive heart failure) (HCC); Seizures (HCC); Morbid obesity (HCC); Chronic pain; Hypercholesteremia; Anginal pain (HCC); Myocardial infarction [facility]) (2002); Diabetes mellitus; Anemia; History of blood transfusion (1993); H/O hiatal hernia; Daily headache; Arthritis; Hypoventilation syndrome; Atrial fibrillation (HCC); Atrial flutter, paroxysmal (HCC); Exertional dyspnea; Shortness of breath; Pneumonia; Anxiety; Episodic mood disorder (HCC); Psychosis; Schizophrenia (HCC); RETARDATION, MENTAL, 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   has past surgical  history that includes Tubal ligation (1998); Cholecystectomy (?2008); Cardiac catheterization (2008); and Vaginal hysterectomy (1993).    LINES/TUBES: ETT 11/24 > CVL L [place] 11/24 > R radial aline 11/23 >   SIGNIFICANT EVENTS: 11/23 > admitted with AMS. 11/24 > transferred to SDU with worsening mental status.  Required intubation after failing BiPAP.   SUBJECTIVE:  05/15/15 - now on TF. On levophed. Positive blood cultures  VITAL SIGNS: BP 83/27 mmHg  Pulse 95  Temp(Src) 98.3 F (36.8 C) (Oral)  Resp 24  Ht  (1.575 m)  Wt 185.068 kg (408 lb)  BMI 74.61 kg/m2  SpO2 92%  HEMODYNAMICS: CVP:  [7 mmHg-14 mmHg] 7 mmHg  VENTILATOR SETTINGS: Vent Mode:  [-] PRVC FiO2 (%):  [40 %-80 %] 40 % Set Rate:  [24 bmp] 24 bmp Vt Set:  [420 mL] 420 mL PEEP:  [5 cmH20] 5 cmH20 Plateau Pressure:  [18 cmH20-29 cmH20] 18 cmH20  INTAKE / OUTPUT: I/O last 3 completed shifts: In: 5733.4 [I.V.:4583.4; IV Piggyback:1150] Out: 2620 [Urine:2400; Emesis/NG output:220]  PHYSICAL EXAMINATION: General: Obese female, looks critically ill Neuro: RASS -2. Opened eys and looked -> did not follow commands-> on levophed, fent gtt HEENT: [place]/AT. PERRL, sclerae anicteric. Cardiovascular: RRR, no M/R/G appreciated with respect to body habitus. Lungs: Respirations even and unlabored.  Coarse bilaterally. Abdomen: Obese, BS x 4, soft, NT/ND.  Musculoskeletal: No gross deformities, no edema.  Skin: Intact, warm, no rashes.  LABS:  PULMONARY  Recent Labs Lab 05/14/15 0155 05/14/15 0343 05/14/15 0611  PHART 7.201* 7.158* 7.216*  PCO2ART 82.1* 88.1* 75.5*  PO2ART 76.3* 365* 152*  HCO3 31.1* 29.9* 29.4*  TCO2 30.3 29.3 28.3  O2SAT 91.5 99.5 98.5    CBC  Recent Labs Lab 05/13/15 1257 05/14/15 0405 05/15/15 0502  HGB 11.2* 10.2* 10.2*  HCT  37.0 34.1* 33.8*  WBC 6.6 6.0 6.7  PLT 249 233 226    COAGULATION No results for input(s): INR in the last 168 hours.  CARDIAC   Recent  Labs Lab 05/14/15 0830  TROPONINI <0.03   No results for input(s): PROBNP in the last 168 hours.   CHEMISTRY  Recent Labs Lab 05/13/15 1257 05/13/15 2115 05/14/15 0405 05/15/15 0502 05/15/15 0900  NA 132*  --  135 137 136  K 5.3*  --  5.1 4.2 4.1  CL 92*  --  98* 101 103  CO2 33*  --  32 27 26  GLUCOSE 100*  --  106* 100* 84  BUN 60*  --  59* 40* 34*  CREATININE 2.93*  --  2.62* 1.17* 1.06*  CALCIUM 9.6  --  8.8* 9.0 8.9  MG  --  1.9  --  1.6*  --   PHOS  --  3.9  --  1.6*  --    Estimated Creatinine Clearance: 90.4 mL/min (by C-G formula based on Cr of 1.06).   LIVER  Recent Labs Lab 05/14/15 0405  AST 19  ALT 15  ALKPHOS 56  BILITOT 0.6  PROT 5.5*  ALBUMIN 2.7*     INFECTIOUS  Recent Labs Lab 05/13/15 1304 05/14/15 0405 05/14/15 0830 05/15/15 0502  LATICACIDVEN 0.68 0.4* 0.6  --   PROCALCITON  --   --  0.11 <0.10     ENDOCRINE CBG (last 3)   Recent Labs  05/15/15 0055 05/15/15 0437 05/15/15 0725  GLUCAP 78 99 146*         IMAGING x48h  - image(s) personally visualized  -   highlighted in bold Portable Chest Xray  05/15/2015  CLINICAL DATA:  Intubation. EXAM: PORTABLE CHEST 1 VIEW COMPARISON:  05/14/2015. FINDINGS: Endotracheal tube 3 cm above the carina. Left subclavian central line stable position. NG tube in stable position. Mediastinum and hilar structures are unremarkable. Cardiomegaly with normal pulmonary vascularity. Bibasilar subsegmental atelectasis and/or infiltrates. Small right pleural effusion cannot be excluded. No pneumothorax . IMPRESSION: 1. Endotracheal tube 3 cm above the carina. Remaining lines and tubes in stable position. 2. Bibasilar subsegmental atelectasis and/or mild infiltrates. 3. Stable cardiomegaly . Electronically Signed   By: Maisie Fus  Register   On: 05/15/2015 07:06   Dg Chest Port 1 View  05/14/2015  CLINICAL DATA:  Central line and orogastric tube placement. Altered mental status. Initial encounter.  EXAM: PORTABLE CHEST 1 VIEW COMPARISON:  Chest radiograph performed 05/13/2015 FINDINGS: The patient's endotracheal tube is seen ending just above the carina. This should be retracted 2-3 cm. An enteric tube is noted extending below the diaphragm. Patchy bilateral airspace opacification may reflect atelectasis. No definite pleural effusion or pneumothorax is seen. The cardiomediastinal silhouette is enlarged. No acute osseous abnormalities are identified. IMPRESSION: 1. Endotracheal tube seen ending just above the carina. This should be retracted 2-3 cm. 2. Patchy bilateral airspace opacification may reflect atelectasis. Cardiomegaly noted. Electronically Signed   By: Roanna Raider M.D.   On: 05/14/2015 06:24   Dg Chest Port 1 View  05/13/2015  CLINICAL DATA:  Hypoxia. Diabetes. Shortness of breath. Former smoker for 14 years. EXAM: PORTABLE CHEST 1 VIEW COMPARISON:  05/23/2013 FINDINGS: Shallow inspiration. Cardiac enlargement without significant vascular congestion. Mild volume loss in the right upper lung is probably due to shallow inspiration. No focal consolidation or airspace disease is appreciated. No blunting of costophrenic angles. No pneumothorax. Mediastinal contours appear intact. IMPRESSION: Cardiac enlargement.  Shallow  inspiration.  No focal consolidation. Electronically Signed   By: Burman Nieves M.D.   On: 05/13/2015 20:06       ASSESSMENT / PLAN:  PULMONARY OETT 11/24 > A: Acute hypercarbic respiratory failure - failed BiPAP and required intubation 11/24. Probable OSA / OHS. Former smoker.   - does not meet sbt criteria  P:   Full vent support. Repeat ABG stat and AM tomorrow Wean as able. VAP prevention measures. SBT when able. DuoNebs / Albuterol. CXR in AM.  NEUROLOGIC A:   Acute metabolic encephalopathy - multifactorial in the setting of hypercarbia, sepsis. Hx anxiety, psychosis, schizophrenia, mild mental retardation.  - needing sedatiion gtt  P:    Sedation:  Fentanyl gtt / Midazolam PRN. RASS goal: 0 to -1. Daily WUA. Continue outpatient depakote (change to IV), lamictal. Assess levels of above. Hold outpatient chlorpromazine, lorazepam.  INFECTIOUS Results for orders placed or performed during the hospital encounter of 05/13/15  Culture, blood (routine x 2)     Status: None   Collection Time: 05/13/15  1:05 PM  Result Value Ref Range Status   Specimen Description BLOOD RIGHT ANTECUBITAL  Final   Special Requests BOTTLES DRAWN AEROBIC ONLY 5CC  Final   Culture  Setup Time   Final    GRAM POSITIVE COCCI IN CLUSTERS AEROBIC BOTTLE ONLY CRITICAL RESULT CALLED TO, READ BACK BY AND VERIFIED WITH: R JOHNSON 05/14/15 @ 1157 M VESTAL    Culture   Final    STAPHYLOCOCCUS SPECIES (COAGULASE NEGATIVE) THE SIGNIFICANCE OF ISOLATING THIS ORGANISM FROM A SINGLE VENIPUNCTURE CANNOT BE PREDICTED WITHOUT FURTHER CLINICAL AND CULTURE CORRELATION. SUSCEPTIBILITIES AVAILABLE ONLY ON REQUEST. Performed at [facility]    Report Status 05/15/2015 FINAL  Final  Culture, blood (routine x 2)     Status: None (Preliminary result)   Collection Time: 05/13/15  1:10 PM  Result Value Ref Range Status   Specimen Description BLOOD LEFT ARM  Final   Special Requests BOTTLES DRAWN AEROBIC AND ANAEROBIC  Final   Culture   Final    NO GROWTH < 24 HOURS Performed at [facility]    Report Status PENDING  Incomplete  MRSA PCR Screening     Status: Abnormal   Collection Time: 05/13/15  7:00 PM  Result Value Ref Range Status   MRSA by PCR INVALID RESULTS, SPECIMEN SENT FOR CULTURE (A) NEGATIVE Final    Comment: RESULT CALLED TO, READ BACK BY AND VERIFIED WITH: Sheppard Penton [id number] @ 2123 BY J SCOTTON        The GeneXpert MRSA Assay (FDA approved for NASAL specimens only), is one component of a comprehensive MRSA colonization surveillance program. It is not intended to diagnose MRSA infection nor to guide or monitor treatment  for MRSA infections.   MRSA culture     Status: None   Collection Time: 05/13/15  7:00 PM  Result Value Ref Range Status   Specimen Description NOSE  Final   Special Requests NONE  Final   Culture NOMRSA Performed at [facility]   Final   Report Status 05/15/2015 FINAL  Final  Urine culture     Status: None (Preliminary result)   Collection Time: 05/13/15  9:39 PM  Result Value Ref Range Status   Specimen Description URINE, CATHETERIZED  Final   Special Requests NONE  Final   Culture   Final    >=100,000 COLONIES/mL GRAM NEGATIVE RODS Performed at [facility]    Report Status  PENDING  Incomplete    A:   Shock  - suspect septic due to UTI and questionable right breast cellulitis. Lactate reassuring. GNR urine, GPC blood P:   Abx: Vanc / Imipenem (d/c clinda). Follow cultures. PCT algorithm to limit abx exposure.   ANTIBIOTICS: Clinda 11/23 > 11/24 Vanc 11/23 > Imipenem 11/24 >>>   CARDIOVASCULAR CVL L [place] 11/24 > R radial aline 11/23 > A:  Shock  - suspect septic due to UTI and questionable right breast cellulitis. Lactate reassuring. Hx HLD, MI, A.fib, PAF, combined heart failure (echo from Oct 2013 with EF 50-55%, grade 1 DD).  - still pressor dependent  P:  Norepinephrine as needed to maintain goal MAP > 65. Goal CVP 8 - 12. Repeat lactate. Assess troponin. Echo. Continue outpatient ASA. Hold outpatient furosemide, lisinopril.  RENAL A:   AKI - likely pre-renal from hypovolemia. \Pseudohypocalcemia - corrects to 9.84.   - AKI improved but has severe hypomagenesemia and hypophosphatemia P:   NS @ 125.-> reduce to 75 cc/h Replete mag and phos Assess ionized calcium. BMP in AM.  GASTROINTESTINAL A:   GI prophylaxis. Nutrition. Morbid obesity. P:   SUP: Pantoprazole. NPO. Start TF's.  HEMATOLOGIC A:   Mild anemia - chronic. VTE Prophylaxis. P:  Transfuse for Hgb < 7. SCD's / Lovenox. CBC in AM.  ENDOCRINE A:   Hx DM  - not on meds. Hypothyroidism. P:   Assess Hgb A1c. SSI if glucose consistently > 180. Continue outpatient synthroid, change to IV formulation. Assess TSH.   Family updated: None. At bedside   Interdisciplinary Family Meeting v Palliative Care Meeting:  Due by: 11/30.      The patient is critically ill with multiple organ systems failure and requires high complexity decision making for assessment and support, frequent evaluation and titration of therapies, application of advanced monitoring technologies and extensive interpretation of multiple databases.   Critical Care Time devoted to patient care services described in this note is  35  Minutes. This time reflects time of care of this signee Dr Kalman Shan. This critical care time does not reflect procedure time, or teaching time or supervisory time of PA/NP/Med student/Med Resident etc but could involve care discussion time    Dr. Kalman Shan, M.D., [facility].C.P Pulmonary and Critical Care Medicine Staff Physician [facility] Pager: [phone], If no answer or between  15:00h - 7:00h: call [phone]  05/15/2015 11:17 AM

## 2015-05-15 NOTE — Consult Note (Addendum)
WOC wound consult note Reason for Consult: Wounds on right shoulder and flank, also left flank. Intertriginous dermatitis in the inframammaory and infra pannicular skin folds Wound type: pressure and shear Pressure Ulcer POA: Yes Measurement: Right shoulder, scapulae and flank:  8 ulcers, the largest of which measures 3 x 3.5 x 0.8cm.  All have necrotic tissue in the wound bed.  Left flank:  4 ulcers, the largest of which measures 4cm x 3cm and is covered with black eschar. Wound bed: See above Drainage (amount, consistency, odor)Serous to light yellow exudate on old dressings consistent with autolytically debriding necrotic tissue. Periwound: erythematous, indurated Dressing procedure/placement/frequency: I will provide nursing with guidance for application of Santyl ointment to the lesions. She is already on a constant low pressure mattress with low air loss feature and is being turned and repositioned frequently and per protocol. Prevalon boots for pressure redistribution of heels and intertriginous moisture management orders are in place. WOC nursing team will not follow, but will remain available to this patient, the nursing and medical teams.  Please re-consult if needed.  Thanks, Ladona Mow, MSN, RN, GNP, [place]  Pager# [phone]

## 2015-05-15 NOTE — Progress Notes (Signed)
Pharmacy Antibiotic Time-Out Note  [name] is a 64 y.o. year-old female admitted on 05/13/2015.  The patient is currently on Vancomycin & Primaxin for cellulitis R breast and UTI.  Assessment/Plan: The dose of Primaxin will be adjusted to 500 mg IV q8h based on renal function.  Continue Vancomycin 1750mg  IV q24h Check Vancomycin trough at steady state Monitor renal function and cx data   Temp (24hrs), Avg:98.6 F (37 C), Min:97.9 F (36.6 C), Max:99.5 F (37.5 C)   Recent Labs Lab 05/13/15 1257 05/14/15 0405 05/15/15 0502  WBC 6.6 6.0 6.7    Recent Labs Lab 05/13/15 1257 05/14/15 0405 05/15/15 0502  CREATININE 2.93* 2.62* 1.17*   Estimated Creatinine Clearance: 81.9 mL/min (by C-G formula based on Cr of 1.17).   Antimicrobial allergies: PCN-hives; sulfa- hives  Antimicrobials this admission: 07/13/2014 >> vanc >> 05/14/2015 >> primax >>  Levels/dose changes this admission: 11/25: Primaxin dose increased to 500mg  IV q8h due to improved renal fxn.   Microbiology Results: 11/23 Blood x2: GPC 1/2 11/23: urine cx:   Thank you for allowing pharmacy to be a part of this patient's care.  )Elson Clan PharmD 05/15/2015 9:16 AM

## 2015-05-15 NOTE — Progress Notes (Signed)
Initial Nutrition Assessment  DOCUMENTATION CODES:   Morbid obesity  INTERVENTION:   Initiate Vital HP @ 20 ml/hr via OGT and increase by 10 ml every 4 hours to goal rate of 40 ml/hr.   30 ml Prostat TID.    Tube feeding regimen provides 1260 kcal (50% of needs), 129 grams of protein, and 803 ml of H2O.   RD to continue to monitor  NUTRITION DIAGNOSIS:   Inadequate oral intake related to inability to eat as evidenced by NPO status.  GOAL:   Provide needs based on ASPEN/SCCM guidelines  MONITOR:   Vent status, Labs, Weight trends, TF tolerance, Skin, I & O's  REASON FOR ASSESSMENT:   Consult, Ventilator Enteral/tube feeding initiation and management  ASSESSMENT:   64 yo female, morbidly obese, COPD and OHS, HTN, hypothyroidism, schizoaffective disorder, bipolar, presented to [facility] ED with main concern of several days duration or right breat area pain and swelling. Please note that pt is agitated and unable to provide any meaning full information at the time of the admission.  Patient in room with no family present. TF ordered to recommendations provided above.  Patient is currently intubated on ventilator support MV: 10.3 L/min Temp (24hrs), Avg:98.6 F (37 C), Min:97.9 F (36.6 C), Max:99.5 F (37.5 C)  Propofol: none  Nutrition focused physical exam shows no sign of depletion of muscle mass or body fat.  Labs reviewed: CBGs: 99-146 Elevated BUN & Creatinine Low Mg/ Phos  Diet Order:  Diet NPO time specified  Skin:  Wound (see comment) (multiple posterior wounds)  Last BM:  PTA  Height:   Ht Readings from Last 1 Encounters:  05/13/15 5\' 2"  (1.575 m)    Weight:   Wt Readings from Last 1 Encounters:  05/15/15 408 lb (185.068 kg)    Ideal Body Weight:  50 kg  BMI:  Body mass index is 74.61 kg/(m^2).  Estimated Nutritional Needs:   Kcal:  1100-1250  Protein:  125-135g  Fluid:  2L/day  EDUCATION NEEDS:   No education needs identified at  this time  Tilda Franco, MS, RD, LDN Pager: [phone] After Hours Pager: [phone]

## 2015-05-15 NOTE — Progress Notes (Addendum)
CSW received referral that pt admitted from [facility]  CSW reviewed chart and pt currently on vent.   CSW to continue to follow pt progress and assess when appropriate.  Loletta Specter, MSW, LCSW Clinical Social Worker Coverage for Cori Razor, [place] [phone]

## 2015-05-15 NOTE — Progress Notes (Signed)
Date: May 15, 2015 Chart reviewed for concurrent status and case management needs. Will continue to follow patient for changes and needs: Rhonda Davis, RN, BSN, CCM   [phone] 

## 2015-05-16 ENCOUNTER — Inpatient Hospital Stay (HOSPITAL_COMMUNITY): Payer: Medicare Other

## 2015-05-16 LAB — BLOOD GAS, ARTERIAL
ACID-BASE EXCESS: 3.5 mmol/L — AB (ref 0.0–2.0)
Bicarbonate: 28.2 mEq/L — ABNORMAL HIGH (ref 20.0–24.0)
Drawn by: 308601
FIO2: 0.4
LHR: 24 {breaths}/min
O2 SAT: 94.2 %
PATIENT TEMPERATURE: 98.6
PCO2 ART: 45.6 mmHg — AB (ref 35.0–45.0)
PEEP/CPAP: 5 cmH2O
PH ART: 7.408 (ref 7.350–7.450)
PO2 ART: 76 mmHg — AB (ref 80.0–100.0)
TCO2: 26.1 mmol/L (ref 0–100)
VT: 420 mL

## 2015-05-16 LAB — URINE CULTURE: Culture: >=100000

## 2015-05-16 LAB — BASIC METABOLIC PANEL
ANION GAP: 7 (ref 5–15)
BUN: 24 mg/dL — ABNORMAL HIGH (ref 6–20)
CALCIUM: 8.9 mg/dL (ref 8.9–10.3)
CO2: 28 mmol/L (ref 22–32)
CREATININE: 0.95 mg/dL (ref 0.44–1.00)
Chloride: 103 mmol/L (ref 101–111)
GLUCOSE: 100 mg/dL — AB (ref 65–99)
Potassium: 4.2 mmol/L (ref 3.5–5.1)
Sodium: 138 mmol/L (ref 135–145)

## 2015-05-16 LAB — GLUCOSE, CAPILLARY
GLUCOSE-CAPILLARY: 118 mg/dL — AB (ref 65–99)
GLUCOSE-CAPILLARY: 88 mg/dL (ref 65–99)
GLUCOSE-CAPILLARY: 92 mg/dL (ref 65–99)
GLUCOSE-CAPILLARY: 87 mg/dL (ref 65–99)
GLUCOSE-CAPILLARY: 78 mg/dL (ref 65–99)
Glucose-Capillary: 99 mg/dL (ref 65–99)
Glucose-Capillary: 93 mg/dL (ref 65–99)

## 2015-05-16 LAB — CBC WITH DIFFERENTIAL/PLATELET
BASOS ABS: 0 10*3/uL (ref 0.0–0.1)
BASOS PCT: 0 %
EOS PCT: 1 %
Eosinophils Absolute: 0.1 10*3/uL (ref 0.0–0.7)
HCT: 33.6 % — ABNORMAL LOW (ref 36.0–46.0)
Hemoglobin: 10.3 g/dL — ABNORMAL LOW (ref 12.0–15.0)
Lymphocytes Relative: 32 %
Lymphs Abs: 2.4 10*3/uL (ref 0.7–4.0)
MCH: 29.6 pg (ref 26.0–34.0)
MCHC: 30.7 g/dL (ref 30.0–36.0)
MCV: 96.6 fL (ref 78.0–100.0)
MONO ABS: 1.2 10*3/uL — AB (ref 0.1–1.0)
MONOS PCT: 15 %
Neutro Abs: 3.9 10*3/uL (ref 1.7–7.7)
Neutrophils Relative %: 52 %
PLATELETS: 219 10*3/uL (ref 150–400)
RBC: 3.48 MIL/uL — ABNORMAL LOW (ref 3.87–5.11)
RDW: 17 % — AB (ref 11.5–15.5)
WBC: 7.5 10*3/uL (ref 4.0–10.5)

## 2015-05-16 LAB — PROCALCITONIN: Procalcitonin: 0.1 ng/mL

## 2015-05-16 LAB — LAMOTRIGINE LEVEL: Lamotrigine Lvl: 13.6 ug/mL (ref 2.0–20.0)

## 2015-05-16 LAB — PHOSPHORUS: Phosphorus: 2.5 mg/dL (ref 2.5–4.6)

## 2015-05-16 LAB — MAGNESIUM: Magnesium: 2.1 mg/dL (ref 1.7–2.4)

## 2015-05-16 MED ORDER — CHLORHEXIDINE GLUCONATE 0.12% ORAL RINSE (MEDLINE KIT)
15.0000 mL | Freq: Two times a day (BID) | OROMUCOSAL | Status: DC
  Administered 2015-05-16 – 2015-05-20 (×9): 15 mL via OROMUCOSAL

## 2015-05-16 MED ORDER — CETYLPYRIDINIUM CHLORIDE 0.05 % MT LIQD
7.0000 mL | Freq: Two times a day (BID) | OROMUCOSAL | Status: DC
Start: 2015-05-16 — End: 2015-05-16

## 2015-05-16 MED ORDER — ANTISEPTIC ORAL RINSE SOLUTION (CORINZ)
7.0000 mL | Freq: Four times a day (QID) | OROMUCOSAL | Status: DC
  Administered 2015-05-16 – 2015-05-20 (×12): 7 mL via OROMUCOSAL

## 2015-05-16 MED ORDER — MIDAZOLAM HCL 2 MG/2ML IJ SOLN
1.0000 mg | INTRAMUSCULAR | Status: DC | PRN
  Administered 2015-05-16: 2 mg via INTRAVENOUS
  Filled 2015-05-16: qty 2

## 2015-05-16 MED ORDER — CHLORHEXIDINE GLUCONATE 0.12 % MT SOLN: 15.0000 mL | Freq: Two times a day (BID) | OROMUCOSAL | Status: DC

## 2015-05-16 MED ORDER — FENTANYL CITRATE (PF) 100 MCG/2ML IJ SOLN
50.0000 ug | INTRAMUSCULAR | Status: DC | PRN
  Administered 2015-05-16: 50 ug via INTRAVENOUS
  Filled 2015-05-16 (×2): qty 2

## 2015-05-16 MED ORDER — RESOURCE THICKENUP CLEAR PO POWD
ORAL | Status: DC | PRN
  Filled 2015-05-16: qty 125

## 2015-05-16 NOTE — Progress Notes (Signed)
Pt seen, no respiratory distress noted or voiced by pt at this time. Bipap not indicated. Pt remains on 4lnc, HR95, rr15, Spo2 96%. RT will continue to monitor and assess as needed.

## 2015-05-16 NOTE — Progress Notes (Signed)
Patient was extubated to BiPAP 10/+5 per MD order to maintain Vt of >350 mL and rr <35 breaths per min. Patient removed BiPAP and refused to be put back on. Patient is not in distress and unlabored. Patient placed on 6 L [place] due to desaturation. O2 98% on 6 L and will be titrated down to achieve O2 goal of >93%. RN at bedside with RT. RT will continue to monitor patient and place patient on BiPAP if patient WOB increases.

## 2015-05-16 NOTE — Progress Notes (Signed)
25 cc of 2500 mcg in 250 ml Fentanyl gtt wasted in sink w/ Volanda Napoleon, RN.   Starla Link, RN

## 2015-05-16 NOTE — Evaluation (Signed)
Clinical/Bedside Swallow Evaluation Patient Details  Name: [name] MRN: [id number] Date of Birth: Nov 08, 1950  Today's Date: 05/16/2015 Time: SLP Start Time (ACUTE ONLY): 1440 SLP Stop Time (ACUTE ONLY): 1458 SLP Time Calculation (min) (ACUTE ONLY): 18 min  Past Medical History:  Past Medical History  Diagnosis Date  . Bipolar disorder (HCC)   . Schizoaffective disorder (HCC)   . Mood disorder (HCC)   . Epilepsy (HCC)   . Thyroid disease   . Hiatal hernia   . Atrial flutter (HCC)   . Schizoaffective disorder   . Urinary tract infection   . Hypertension   . GERD (gastroesophageal reflux disease)   . Hypothyroidism   . Obesity   . Asthma   . COPD (chronic obstructive pulmonary disease) (HCC)   . CHF (congestive heart failure) (HCC)   . Seizures (HCC)   . Morbid obesity (HCC)   . Chronic pain   . Hypercholesteremia   . Anginal pain (HCC)   . Myocardial infarction (HCC) 2002  . Diabetes mellitus     "I'm not diabetic anymore" (05/23/2012)  . Anemia   . History of blood transfusion 1993    "when I had my hysterectomy" (05/23/2012)  . H/O hiatal hernia   . Daily headache   . Arthritis     "severe; all over my body" (05/23/2012)  . Hypoventilation syndrome     [name] 05/30/2012  . Atrial fibrillation (HCC)   . Atrial flutter, paroxysmal (HCC)     [name] 05/30/2012  . Exertional dyspnea   . Shortness of breath     "all the time lately" (05/30/2012)  . Pneumonia     "several times; including now" (04/23/2013)  . Anxiety   . Episodic mood disorder (HCC)   . Psychosis   . Schizophrenia (HCC)   . 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 CHOLELITHIASIS 11/30/2009    Qualifier: Diagnosis of  By: Brynda Rim     Past Surgical History:  Past Surgical History  Procedure Laterality Date  . Tubal ligation  1998  . Cholecystectomy  ?2008  . Cardiac catheterization  2008  . Vaginal hysterectomy  1993   HPI:   [name] is a 64 y.o. female with a PMH including schizoaffective disorder, tardive dyskinesia, GERD, who resides at [facility]. She presented to [facility] ED 11/23 due to right breast area pain and swelling.  She was admitted for AMS / acute encephalopathy of unclear etiology but possibly due to schizoaffective disorder, bipolar disorder, AKI with hyperkalemia, dehydration, cellulitis of right breast. Later that evening, she became hypoxic and later more altered, she was placed on BiPAP. She ultimately failed BiPAP and was intubated on 05/14/15 until 05/16/15. Was previously seen by SLP in 5/15. Report states pt was consuming a regualr diet and thin liquids prior to that admission, but consume puree and nectar due to significant oral dysphagia, worsened by mental status change.    Assessment / Plan / Recommendation Clinical Impression  Pt demonstrates multiple risk factors for aspiration following brief intubation. Pt has hoarse vocal quality, inability to sit fully upright due to morbid obesity, cognitive impairment at baseline, and history of impaired swallow with acute illness. Under observation pt demonstrates signs of aspiration with thin and nectar thick liquids (immediate throat clear/delayed cough), likely due to suspected delayed swallow. Pt tolerated puree and honey thick liquids well. Recommend pt initiate conservative diet of puree/honey with f/u  for diet upgrade as pt progresses.     Aspiration Risk  Moderate aspiration risk    Diet Recommendation     Medication Administration: Whole meds with puree    Other  Recommendations Oral Care Recommendations: Oral care BID Other Recommendations: Order thickener from pharmacy;Have oral suction available   Follow up Recommendations       Frequency and Duration min 2x/week  2 weeks       Prognosis Prognosis for Safe Diet Advancement: Fair Barriers to Reach Goals: Cognitive deficits      Swallow Study   General HPI: [name] is a  64 y.o. female with a PMH including schizoaffective disorder, tardive dyskinesia, GERD, who resides at [facility]. She presented to [facility] ED 11/23 due to right breast area pain and swelling.  She was admitted for AMS / acute encephalopathy of unclear etiology but possibly due to schizoaffective disorder, bipolar disorder, AKI with hyperkalemia, dehydration, cellulitis of right breast. Later that evening, she became hypoxic and later more altered, she was placed on BiPAP. She ultimately failed BiPAP and was intubated on 05/14/15 until 05/16/15. Was previously seen by SLP in 5/15. Report states pt was consuming a regualr diet and thin liquids prior to that admission, but consume puree and nectar due to significant oral dysphagia, worsened by mental status change.  Type of Study: Bedside Swallow Evaluation Previous Swallow Assessment: see HPI Diet Prior to this Study: NPO Temperature Spikes Noted: No Respiratory Status: Nasal cannula History of Recent Intubation: Yes Length of Intubations (days): 2 days Date extubated: 05/16/15 Behavior/Cognition: Alert;Confused;Requires cueing Oral Cavity Assessment: Within Functional Limits Oral Care Completed by SLP: Recent completion by staff Oral Cavity - Dentition: Edentulous Vision: Functional for self-feeding Self-Feeding Abilities: Total assist Patient Positioning: Postural control interferes with function;Partially reclined Baseline Vocal Quality: Hoarse;Breathy Volitional Cough: Weak Volitional Swallow: Able to elicit    Oral/Motor/Sensory Function Overall Oral Motor/Sensory Function: Other (comment) (doesnt follow commands, no focal weakness, but dysarthric)   Ice Chips     Thin Liquid Thin Liquid: Impaired Presentation: Straw;Cup Pharyngeal  Phase Impairments: Suspected delayed Swallow;Throat Clearing - Immediate    Nectar Thick Nectar Thick Liquid: Impaired Presentation: Straw Pharyngeal Phase Impairments: Suspected delayed Swallow;Cough - Delayed    Honey Thick Honey Thick Liquid: Impaired Presentation: Spoon Pharyngeal Phase Impairments: Suspected delayed Swallow   Puree Puree: Impaired Presentation: Spoon Pharyngeal Phase Impairments: Suspected delayed Swallow   Solid Solid: Not tested      Harlon Ditty, MA CCC-SLP [phone]  Audy Dauphine, Riley Nearing 05/16/2015,3:13 PM

## 2015-05-16 NOTE — Progress Notes (Signed)
abg collected  

## 2015-05-16 NOTE — Procedures (Signed)
Extubation Procedure Note  Patient Details:   Name: [name] DOB: [birth year] MRN: [id number]   Airway Documentation:  Airway 7.5 mm (Active)  Secured at (cm) 25 cm 05/16/2015  8:00 AM  Measured From Lips 05/16/2015  8:00 AM  Secured Location Center 05/16/2015  8:00 AM  Secured By [employer] 05/16/2015  8:00 AM  Tube Holder Repositioned Yes 05/16/2015  7:31 AM  Cuff Pressure (cm H2O) 25 cm H2O 05/16/2015  7:31 AM  Site Condition Dry 05/16/2015  8:00 AM    Evaluation  O2 sats: stable throughout Complications: No apparent complications Patient did tolerate procedure well. Bilateral Breath Sounds: Diminished Suctioning: Oral, Airway Yes  Patient was extubated to BiPAP at 10/+5. No complications were noted during extubation and patient is comfortable.  Dannielle Karvonen 05/16/2015, 11:39 AM

## 2015-05-16 NOTE — Consult Note (Signed)
PULMONARY / CRITICAL CARE MEDICINE   Name: [name] MRN: [id number] DOB: [birth year]    ADMISSION DATE:  05/13/2015 CONSULTATION DATE:  06/12/15  REFERRING MD :  Izola Price  CHIEF COMPLAINT:  AMS  BRIEF. [name] is a 64 y.o. female with a PMH as outlined below who resides at [facility].  She presented to [facility] ED 11/23 due to right breast area pain and swelling.  She was apparently quite agitated at the time of admission to the point that she was unable to provide any meaningful information / history. She was admitted for AMS / acute encephalopathy of unclear etiology but possibly due to schizoaffective disorder, bipolar disorder, AKI with hyperkalemia, dehydration, cellulitis of right breast. Later that evening, she became hypoxic and later more altered.  ABG revealed respiratory acidosis for which she was transferred to SDU.  Repeat ABG later was worse; therefore, she was placed on BiPAP.  She ultimately failed BiPAP and PCCM was consulted 112/4/16 AM for further recs.   has a past medical history of Bipolar disorder (HCC); Schizoaffective disorder (HCC); Mood disorder (HCC); Epilepsy (HCC); Thyroid disease; Hiatal hernia; Atrial flutter (HCC); Schizoaffective disorder; Urinary tract infection; Hypertension; GERD (gastroesophageal reflux disease); Hypothyroidism; Obesity; Asthma; COPD (chronic obstructive pulmonary disease) (HCC); CHF (congestive heart failure) (HCC); Seizures (HCC); Morbid obesity (HCC); Chronic pain; Hypercholesteremia; Anginal pain (HCC); Myocardial infarction [facility]) (2002); Diabetes mellitus; Anemia; History of blood transfusion (1993); H/O hiatal hernia; Daily headache; Arthritis; Hypoventilation syndrome; Atrial fibrillation (HCC); Atrial flutter, paroxysmal (HCC); Exertional dyspnea; Shortness of breath; Pneumonia; Anxiety; Episodic mood disorder (HCC); Psychosis; Schizophrenia (HCC); RETARDATION, MENTAL, 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   has past surgical  history that includes Tubal ligation (1998); Cholecystectomy (?2008); Cardiac catheterization (2008); and Vaginal hysterectomy (1993).    LINES/TUBES: ETT 11/24 > CVL L [place] 11/24 > R radial aline 11/23 >   SIGNIFICANT EVENTS: 11/23 > admitted with AMS. 11/24 > transferred to SDU with worsening mental status.  Required intubation after failing BiPAP. 05/15/15 - now on TF. On levophed. Positive GPC blood cultures and GNR in urine   SUBJECTIVE/OVERNIGHT/INTERVAL HX 05/15/15 - E Colii in urnie. On levophed 42mcg/min. Off sedation gtt -> normal wua, Asking for extubation. Reports being bed/chair riodden at SNF. Doing PSV at high 15/5 level  VITAL SIGNS: BP 112/76 mmHg  Pulse 95  Temp(Src) 99.6 F (37.6 C) (Oral)  Resp 19  Ht 5\' 2"  (1.575 m)  Wt 183.253 kg (404 lb)  BMI 73.87 kg/m2  SpO2 100%  HEMODYNAMICS: CVP:  [10 mmHg-13 mmHg] 13 mmHg  VENTILATOR SETTINGS: Vent Mode:  [-] PRVC FiO2 (%):  [40 %] 40 % Set Rate:  [20 bmp-24 bmp] 20 bmp Vt Set:  [420 mL] 420 mL PEEP:  [5 cmH20] 5 cmH20 Pressure Support:  [16 cmH20] 16 cmH20 Plateau Pressure:  [16 cmH20-19 cmH20] 19 cmH20  INTAKE / OUTPUT: I/O last 3 completed shifts: In: 7795.6 [I.V.:5237.3; NG/GT:550.3; IV Piggyback:2008] Out: 3100 [Urine:2980; Emesis/NG output:120]  PHYSICAL EXAMINATION: General: Obese female, looks critically ill Neuro: RASS 0, CAM-ICU neg for dlirium.. Moves all 4s -> off fent gtt x 30 minutes HEENT: [place]/AT. PERRL, sclerae anicteric. Cardiovascular: RRR, no M/R/G appreciated with respect to body habitus. Lungs: Respirations even and unlabored.  Coarse bilaterally. Abdomen: Obese, BS x 4, soft, NT/ND.  Musculoskeletal: No gross deformities, no edema.  Skin: Intact, warm, no rashes.  LABS:  PULMONARY  Recent Labs Lab 05/14/15 0155 05/14/15 0343 05/14/15 0611 05/16/15 0358  PHART 7.201* 7.158* 7.216*  7.408  PCO2ART 82.1* 88.1* 75.5* 45.6*  PO2ART 76.3* 365* 152* 76.0*  HCO3 31.1* 29.9*  29.4* 28.2*  TCO2 30.3 29.3 28.3 26.1  O2SAT 91.5 99.5 98.5 94.2    CBC  Recent Labs Lab 05/14/15 0405 05/15/15 0502 05/16/15 0528  HGB 10.2* 10.2* 10.3*  HCT 34.1* 33.8* 33.6*  WBC 6.0 6.7 7.5  PLT 233 226 219    COAGULATION No results for input(s): INR in the last 168 hours.  CARDIAC    Recent Labs Lab 05/14/15 0830  TROPONINI <0.03   No results for input(s): PROBNP in the last 168 hours.   CHEMISTRY  Recent Labs Lab 05/13/15 1257 05/13/15 2115 05/14/15 0405 05/15/15 0502 05/15/15 0900 05/16/15 0528  NA 132*  --  135 137 136 138  K 5.3*  --  5.1 4.2 4.1 4.2  CL 92*  --  98* 101 103 103  CO2 33*  --  32 27 26 28   GLUCOSE 100*  --  106* 100* 84 100*  BUN 60*  --  59* 40* 34* 24*  CREATININE 2.93*  --  2.62* 1.17* 1.06* 0.95  CALCIUM 9.6  --  8.8* 9.0 8.9 8.9  MG  --  1.9  --  1.6*  --  2.1  PHOS  --  3.9  --  1.6*  --  2.5   Estimated Creatinine Clearance: 100.2 mL/min (by C-G formula based on Cr of 0.95).   LIVER  Recent Labs Lab 05/14/15 0405  AST 19  ALT 15  ALKPHOS 56  BILITOT 0.6  PROT 5.5*  ALBUMIN 2.7*     INFECTIOUS  Recent Labs Lab 05/13/15 1304 05/14/15 0405 05/14/15 0830 05/15/15 0502 05/16/15 0528  LATICACIDVEN 0.68 0.4* 0.6  --   --   PROCALCITON  --   --  0.11 <0.10 0.10     ENDOCRINE CBG (last 3)   Recent Labs  05/15/15 1618 05/15/15 1932 05/16/15 0740  GLUCAP 118* 114* 99         IMAGING x48h  - image(s) personally visualized  -   highlighted in bold Portable Chest Xray  05/15/2015  CLINICAL DATA:  Intubation. EXAM: PORTABLE CHEST 1 VIEW COMPARISON:  05/14/2015. FINDINGS: Endotracheal tube 3 cm above the carina. Left subclavian central line stable position. NG tube in stable position. Mediastinum and hilar structures are unremarkable. Cardiomegaly with normal pulmonary vascularity. Bibasilar subsegmental atelectasis and/or infiltrates. Small right pleural effusion cannot be excluded. No pneumothorax  . IMPRESSION: 1. Endotracheal tube 3 cm above the carina. Remaining lines and tubes in stable position. 2. Bibasilar subsegmental atelectasis and/or mild infiltrates. 3. Stable cardiomegaly . Electronically Signed   By: Maisie Fus  Register   On: 05/15/2015 07:06       ASSESSMENT / PLAN:  PULMONARY OETT 11/24 > A: Acute hypercarbic respiratory failure - failed BiPAP and required intubation 11/24. Probable OSA / OHS. Former smoker.   - doing SBT but at high 15/5 PSV  P:   PSV at lower level and if ok -> extubate to BiPAP Otherwise, Full vent support. VAP prevention measures. SBT when able. DuoNebs / Albuterol. CXR in AM.  NEUROLOGIC A:   Acute metabolic encephalopathy - multifactorial in the setting of hypercarbia, sepsis. Hx anxiety, psychosis, schizophrenia, mild mental retardation.  - Normal WUA off sedation gtt x 30 min  P:   Sedation:  Fentanyl gtt change to prn / Midazolam PRN. RASS goal: 0 to -1. Daily WUA. Continue outpatient depakote (change to IV), lamictal. Assess  levels of above. Hold outpatient chlorpromazine, lorazepam.  INFECTIOUS Results for orders placed or performed during the hospital encounter of 05/13/15  Culture, blood (routine x 2)     Status: None   Collection Time: 05/13/15  1:05 PM  Result Value Ref Range Status   Specimen Description BLOOD RIGHT ANTECUBITAL  Final   Special Requests BOTTLES DRAWN AEROBIC ONLY 5CC  Final   Culture  Setup Time   Final    GRAM POSITIVE COCCI IN CLUSTERS AEROBIC BOTTLE ONLY CRITICAL RESULT CALLED TO, READ BACK BY AND VERIFIED WITH: R JOHNSON 05/14/15 @ 1157 M VESTAL    Culture   Final    STAPHYLOCOCCUS SPECIES (COAGULASE NEGATIVE) THE SIGNIFICANCE OF ISOLATING THIS ORGANISM FROM A SINGLE VENIPUNCTURE CANNOT BE PREDICTED WITHOUT FURTHER CLINICAL AND CULTURE CORRELATION. SUSCEPTIBILITIES AVAILABLE ONLY ON REQUEST. Performed at [facility]    Report Status 05/15/2015 FINAL  Final  Culture, blood  (routine x 2)     Status: None (Preliminary result)   Collection Time: 05/13/15  1:10 PM  Result Value Ref Range Status   Specimen Description BLOOD LEFT ARM  Final   Special Requests BOTTLES DRAWN AEROBIC AND ANAEROBIC  Final   Culture   Final    NO GROWTH 2 DAYS Performed at [facility]    Report Status PENDING  Incomplete  MRSA PCR Screening     Status: Abnormal   Collection Time: 05/13/15  7:00 PM  Result Value Ref Range Status   MRSA by PCR INVALID RESULTS, SPECIMEN SENT FOR CULTURE (A) NEGATIVE Final    Comment: RESULT CALLED TO, READ BACK BY AND VERIFIED WITH: Sheppard Penton [id number] @ 2123 BY J SCOTTON        The GeneXpert MRSA Assay (FDA approved for NASAL specimens only), is one component of a comprehensive MRSA colonization surveillance program. It is not intended to diagnose MRSA infection nor to guide or monitor treatment for MRSA infections.   MRSA culture     Status: None   Collection Time: 05/13/15  7:00 PM  Result Value Ref Range Status   Specimen Description NOSE  Final   Special Requests NONE  Final   Culture NOMRSA Performed at [facility]   Final   Report Status 05/15/2015 FINAL  Final  Urine culture     Status: None (Preliminary result)   Collection Time: 05/13/15  9:39 PM  Result Value Ref Range Status   Specimen Description URINE, CATHETERIZED  Final   Special Requests NONE  Final   Culture   Final    >=100,000 COLONIES/mL ESCHERICHIA COLI Performed at [facility]    Report Status PENDING  Incomplete    A:   Shock  - suspect septic due to UTI and questionable right breast cellulitis. Lactate reassuring. GNR urine, GPC blood P:    ANTIBIOTICS: Clinda 11/23 > 11/24 Vanc 11/23 > Imipenem 11/24 >>> Await sensit  CARDIOVASCULAR CVL L [place] 11/24 > R radial aline 11/23 > A:  Shock  - suspect septic due to UTI and questionable right breast cellulitis. Lactate reassuring. Hx HLD, MI, A.fib, PAF, combined heart  failure (echo from Oct 2013 with EF 50-55%, grade 1 DD).  - still pressor dependent . ECHO 05/14/15 - ef 55% P:  Norepinephrine as needed to maintain goal MAP > 65. Goal CVP 8 - 12. Continue outpatient ASA. Hold outpatient furosemide, lisinopril.  RENAL A:   AKI - likely pre-renal from hypovolemia. \Pseudohypocalcemia - corrects to 9.84.   -  AKI resolved. P:   NS @ 125.-> reduce to 50 cc/h BMP in AM.  GASTROINTESTINAL A:   GI prophylaxis. Nutrition. Morbid obesity. P:   SUP: Pantoprazole. NPO. TF's. (hold temp if going to extubate)  HEMATOLOGIC A:   Mild anemia - chronic. VTE Prophylaxis. P:  Transfuse for Hgb < 7. SCD's / Lovenox. CBC in AM.  ENDOCRINE A:   Hx DM - not on meds. Hypothyroidism. P:   Assess Hgb A1c. SSI if glucose consistently > 180. Continue outpatient synthroid, change to IV formulation. Assess TSH.   Family updated: None. At bedside   Interdisciplinary Family Meeting v Palliative Care Meeting:  Due by: 11/30.   GLOBAL If does well on SBT at lower PSV leve extubate while on pressors to BiPAP. High risk for extubation failure acknowledged    The patient is critically ill with multiple organ systems failure and requires high complexity decision making for assessment and support, frequent evaluation and titration of therapies, application of advanced monitoring technologies and extensive interpretation of multiple databases.   Critical Care Time devoted to patient care services described in this note is  35  Minutes. This time reflects time of care of this signee Dr Kalman Shan. This critical care time does not reflect procedure time, or teaching time or supervisory time of PA/NP/Med student/Med Resident etc but could involve care discussion time    Dr. Kalman Shan, M.D., [facility].C.P Pulmonary and Critical Care Medicine Staff Physician [facility]  Pulmonary and Critical Care Pager: [phone], If no answer or  between  15:00h - 7:00h: call [phone]  05/16/2015 8:27 AM

## 2015-05-16 NOTE — Progress Notes (Signed)
Patient vent mode changed to PSV/CPAP of 14/+5 and 40%. RN aware of vent change and at bedside. Patient is comfortable and vitals are stable. No distress noted at this time. RT will continue to monitor patient.

## 2015-05-16 NOTE — Progress Notes (Signed)
Doing well on lower PSV  Plan extrubate to bipap NPO  Dr. Kalman Shan, M.D., [facility].C.P Pulmonary and Critical Care Medicine Staff Physician [facility] Pulmonary and Critical Care Pager: [phone], If no answer or between  15:00h - 7:00h: call [phone]  05/16/2015 10:47 AM

## 2015-05-17 ENCOUNTER — Inpatient Hospital Stay (HOSPITAL_COMMUNITY): Payer: Medicare Other

## 2015-05-17 LAB — GLUCOSE, CAPILLARY
GLUCOSE-CAPILLARY: 94 mg/dL (ref 65–99)
GLUCOSE-CAPILLARY: 90 mg/dL (ref 65–99)
GLUCOSE-CAPILLARY: 99 mg/dL (ref 65–99)
Glucose-Capillary: 93 mg/dL (ref 65–99)
Glucose-Capillary: 71 mg/dL (ref 65–99)

## 2015-05-17 LAB — CBC WITH DIFFERENTIAL/PLATELET
BASOS PCT: 0 %
Basophils Absolute: 0 10*3/uL (ref 0.0–0.1)
EOS ABS: 0.2 10*3/uL (ref 0.0–0.7)
Eosinophils Relative: 3 %
HCT: 36.3 % (ref 36.0–46.0)
HEMOGLOBIN: 10.5 g/dL — AB (ref 12.0–15.0)
Lymphocytes Relative: 39 %
Lymphs Abs: 2.8 10*3/uL (ref 0.7–4.0)
MCH: 29.1 pg (ref 26.0–34.0)
MCHC: 28.9 g/dL — AB (ref 30.0–36.0)
MCV: 100.6 fL — ABNORMAL HIGH (ref 78.0–100.0)
Monocytes Absolute: 0.9 10*3/uL (ref 0.1–1.0)
Monocytes Relative: 13 %
NEUTROS PCT: 45 %
Neutro Abs: 3.4 10*3/uL (ref 1.7–7.7)
Platelets: 194 10*3/uL (ref 150–400)
RBC: 3.61 MIL/uL — AB (ref 3.87–5.11)
RDW: 17.1 % — ABNORMAL HIGH (ref 11.5–15.5)
WBC: 7.3 10*3/uL (ref 4.0–10.5)

## 2015-05-17 LAB — BASIC METABOLIC PANEL
Anion gap: 4 — ABNORMAL LOW (ref 5–15)
BUN: 15 mg/dL (ref 6–20)
CHLORIDE: 104 mmol/L (ref 101–111)
CO2: 31 mmol/L (ref 22–32)
CREATININE: 0.8 mg/dL (ref 0.44–1.00)
Calcium: 9.2 mg/dL (ref 8.9–10.3)
GFR calc non Af Amer: >60 mL/min (ref >60)
Glucose, Bld: 103 mg/dL — ABNORMAL HIGH (ref 65–99)
POTASSIUM: 4.5 mmol/L (ref 3.5–5.1)
SODIUM: 139 mmol/L (ref 135–145)

## 2015-05-17 LAB — TROPONIN I: Troponin I: <0.03 ng/mL (ref <0.031)

## 2015-05-17 LAB — MAGNESIUM: MAGNESIUM: 1.9 mg/dL (ref 1.7–2.4)

## 2015-05-17 LAB — CALCIUM, IONIZED: Calcium, Ionized, Serum: 5 mg/dL (ref 4.5–5.6)

## 2015-05-17 LAB — PHOSPHORUS: PHOSPHORUS: 3.5 mg/dL (ref 2.5–4.6)

## 2015-05-17 MED ORDER — VALPROATE SODIUM 250 MG/5ML PO SYRP
750.0000 mg | ORAL_SOLUTION | Freq: Every day | ORAL | Status: DC
  Filled 2015-05-17: qty 15

## 2015-05-17 MED ORDER — PANTOPRAZOLE SODIUM 40 MG PO TBEC
40.0000 mg | DELAYED_RELEASE_TABLET | Freq: Every day | ORAL | Status: DC
  Administered 2015-05-17 – 2015-05-21 (×5): 40 mg via ORAL
  Filled 2015-05-17 (×6): qty 1

## 2015-05-17 MED ORDER — FENTANYL CITRATE (PF) 100 MCG/2ML IJ SOLN: 12.5000 ug | INTRAMUSCULAR | Status: DC | PRN

## 2015-05-17 MED ORDER — VALPROATE SODIUM 250 MG/5ML PO SYRP: 500.0000 mg | ORAL_SOLUTION | Freq: Every day | ORAL | Status: DC

## 2015-05-17 MED ORDER — DIVALPROEX SODIUM ER 500 MG PO TB24
1250.0000 mg | ORAL_TABLET | Freq: Every day | ORAL | Status: DC
  Administered 2015-05-17 – 2015-05-20 (×4): 1250 mg via ORAL
  Filled 2015-05-17 (×5): qty 1

## 2015-05-17 MED ORDER — MIDAZOLAM HCL 2 MG/2ML IJ SOLN
1.0000 mg | INTRAMUSCULAR | Status: DC | PRN
  Administered 2015-05-17 – 2015-05-18 (×2): 1 mg via INTRAVENOUS
  Filled 2015-05-17 (×2): qty 2

## 2015-05-17 MED ORDER — LEVOTHYROXINE SODIUM 150 MCG PO TABS
150.0000 ug | ORAL_TABLET | Freq: Every day | ORAL | Status: DC
  Administered 2015-05-17 – 2015-05-21 (×5): 150 ug via ORAL
  Filled 2015-05-17: qty 2
  Filled 2015-05-17: qty 1
  Filled 2015-05-17 (×3): qty 2
  Filled 2015-05-17 (×2): qty 1

## 2015-05-17 MED ORDER — DEXTROSE 5 % IV SOLN
2.0000 g | INTRAVENOUS | Status: DC
  Administered 2015-05-17 – 2015-05-21 (×5): 2 g via INTRAVENOUS
  Filled 2015-05-17 (×5): qty 2

## 2015-05-17 MED ORDER — HYDROCORTISONE NA SUCCINATE PF 100 MG IJ SOLR
50.0000 mg | Freq: Four times a day (QID) | INTRAMUSCULAR | Status: DC
  Administered 2015-05-17 – 2015-05-18 (×4): 50 mg via INTRAVENOUS
  Filled 2015-05-17 (×4): qty 2

## 2015-05-17 NOTE — Progress Notes (Signed)
Patient suddenly became agitated and anxious, requesting that the "wires be moved from my back". There were no wires behind the patients back, but I repositioned the patient, adjusted the pillows behind her head and her back, straightened the sheet behind her, and attempted to assist her in finding a comfortable position. The patient only became more agitated and aggressive, punching the bed and shouting, "Get the wires from behind my back" repeatedly. My attempts to de-escalate the situation, including speaking to the patient calmly, redirection and diverting her attention were unsuccessful. The patient continued to shout and punch the bed. PRN anti-anxiety medication was given. Patient began to calm down and relax quickly. Will continue to monitor. Bosie Helper, RN

## 2015-05-17 NOTE — Progress Notes (Signed)
Patient on 4 L  sating 97%. Patient is not in distress or labored at this time. RT will continue to monitor patient.

## 2015-05-17 NOTE — Progress Notes (Addendum)
PULMONARY / CRITICAL CARE MEDICINE   Name: RIANN OMAN MRN: [id number] DOB: [birth year]    ADMISSION DATE:  05/13/2015 CONSULTATION DATE:  06/12/15  REFERRING MD :  Izola Price  CHIEF COMPLAINT:  AMS  BRIEF. [name] is a 64 y.o. female with a PMH as outlined below who resides at [facility].  She presented to [facility] ED 11/23 due to right breast area pain and swelling.  She was apparently quite agitated at the time of admission to the point that she was unable to provide any meaningful information / history. She was admitted for AMS / acute encephalopathy of unclear etiology but possibly due to schizoaffective disorder, bipolar disorder, AKI with hyperkalemia, dehydration, cellulitis of right breast. Later that evening, she became hypoxic and later more altered.  ABG revealed respiratory acidosis for which she was transferred to SDU.  Repeat ABG later was worse; therefore, she was placed on BiPAP.  She ultimately failed BiPAP and PCCM was consulted 112/4/16 AM for further recs.   has a past medical history of Bipolar disorder (HCC); Schizoaffective disorder (HCC); Mood disorder (HCC); Epilepsy (HCC); Thyroid disease; Hiatal hernia; Atrial flutter (HCC); Schizoaffective disorder; Urinary tract infection; Hypertension; GERD (gastroesophageal reflux disease); Hypothyroidism; Obesity; Asthma; COPD (chronic obstructive pulmonary disease) (HCC); CHF (congestive heart failure) (HCC); Seizures (HCC); Morbid obesity (HCC); Chronic pain; Hypercholesteremia; Anginal pain (HCC); Myocardial infarction [facility]) (2002); Diabetes mellitus; Anemia; History of blood transfusion (1993); H/O hiatal hernia; Daily headache; Arthritis; Hypoventilation syndrome; Atrial fibrillation (HCC); Atrial flutter, paroxysmal (HCC); Exertional dyspnea; Shortness of breath; Pneumonia; Anxiety; Episodic mood disorder (HCC); Psychosis; Schizophrenia (HCC); RETARDATION, MENTAL, 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   has past surgical  history that includes Tubal ligation (1998); Cholecystectomy (?2008); Cardiac catheterization (2008); and Vaginal hysterectomy (1993).    LINES/TUBES: ETT 11/24 > CVL L [place] 11/24 > R radial aline 11/23 >   SIGNIFICANT EVENTS: 11/23 > admitted with AMS. 11/24 > transferred to SDU with worsening mental status.  Required intubation after failing BiPAP. 05/15/15 - now on TF. On levophed. Positive GPC blood cultures and GNR in urine 05/15/15 - E Colii in urnie. On levophed 25mcg/min. Off sedation gtt -> normal wua, Asking for extubation. Reports being bed/chair riodden at SNF. Doing PSV at high 15/5 level  SUBJECTIVE/OVERNIGHT/INTERVAL HX 05/17/15 - s/p extubation x 24h. Denies resp complaints ASking for tobacco. On low dose levophed - sbp 87, map 69. Refusing post extuibation biPAP  VITAL SIGNS: BP 103/65 mmHg  Pulse 93  Temp(Src) 97.5 F (36.4 C) (Oral)  Resp 15  Ht 5\' 2"  (1.575 m)  Wt 185.975 kg (410 lb)  BMI 74.97 kg/m2  SpO2 98%  HEMODYNAMICS: CVP:  [10 mmHg-14 mmHg] 14 mmHg  VENTILATOR SETTINGS: Vent Mode:  [-] BIPAP FiO2 (%):  [40 %] 40 % Set Rate:  [12 bmp] 12 bmp PEEP:  [5 cmH20] 5 cmH20 Pressure Support:  [10 cmH20] 10 cmH20  INTAKE / OUTPUT: I/O last 3 completed shifts: In: 5271 [I.V.:3528.5; Other:90; NG/GT:440; IV Piggyback:1212.5] Out: 2610 [Urine:2610]  PHYSICAL EXAMINATION: General: Obese female, looks improved Neuro: RASS 0, CAM-ICU neg for dlirium.. Moves all 4s  HEENT: [place]/AT. PERRL, sclerae anicteric. Hoarse voice ? baseline Cardiovascular: RRR, no M/R/G appreciated with respect to body habitus. Lungs: Respirations even and unlabored.  Coarse bilaterally. Abdomen: Obese, BS x 4, soft, NT/ND.  Musculoskeletal: No gross deformities, no edema.  Skin: Intact, warm, no rashes.on exposed area (05/17/15 - RN says stabge 2 sacral decub +_)  LABS:  PULMONARY  Recent  Labs Lab 05/14/15 0155 05/14/15 0343 05/14/15 0611 05/16/15 0358  PHART 7.201*  7.158* 7.216* 7.408  PCO2ART 82.1* 88.1* 75.5* 45.6*  PO2ART 76.3* 365* 152* 76.0*  HCO3 31.1* 29.9* 29.4* 28.2*  TCO2 30.3 29.3 28.3 26.1  O2SAT 91.5 99.5 98.5 94.2    CBC  Recent Labs Lab 05/15/15 0502 05/16/15 0528 05/17/15 0625  HGB 10.2* 10.3* 10.5*  HCT 33.8* 33.6* 36.3  WBC 6.7 7.5 7.3  PLT 226 219 194    COAGULATION No results for input(s): INR in the last 168 hours.  CARDIAC    Recent Labs Lab 05/14/15 0830  TROPONINI <0.03   No results for input(s): PROBNP in the last 168 hours.   CHEMISTRY  Recent Labs Lab 05/13/15 2115 05/14/15 0405 05/15/15 0502 05/15/15 0900 05/16/15 0528 05/17/15 0625  NA  --  135 137 136 138 139  K  --  5.1 4.2 4.1 4.2 4.5  CL  --  98* 101 103 103 104  CO2  --  32 GLUCOSE  --  106* 100* 84 100* 103*  BUN  --  59* 40* 34* 24* 15  CREATININE  --  2.62* 1.17* 1.06* 0.95 0.80  CALCIUM  --  8.8* 9.0 8.9 8.9 9.2  MG 1.9  --  1.6*  --  2.1 1.9  PHOS 3.9  --  1.6*  --  2.5 3.5   Estimated Creatinine Clearance: 120.3 mL/min (by C-G formula based on Cr of 0.8).   LIVER  Recent Labs Lab 05/14/15 0405  AST 19  ALT 15  ALKPHOS 56  BILITOT 0.6  PROT 5.5*  ALBUMIN 2.7*     INFECTIOUS  Recent Labs Lab 05/13/15 1304 05/14/15 0405 05/14/15 0830 05/15/15 0502 05/16/15 0528  LATICACIDVEN 0.68 0.4* 0.6  --   --   PROCALCITON  --   --  0.11 <0.10 0.10     ENDOCRINE CBG (last 3)   Recent Labs  05/17/15 0033 05/17/15 0505 05/17/15 0731  GLUCAP 93 94 71         IMAGING x48h  - image(s) personally visualized  -   highlighted in bold Dg Chest Port 1 View  05/17/2015  CLINICAL DATA:  Endotracheal tube.  COPD.  Asthma EXAM: PORTABLE CHEST 1 VIEW COMPARISON:  05/16/2015 FINDINGS: Interval extubation and removal of NG tube. Stable enlarged cardiac silhouette. There is mild increase in fine airspace disease in the upper lobes. Low lung volumes. No increase in atelectasis. LEFT central venous line  unchanged. IMPRESSION: 1. Stable lung volumes following extubation. 2. Slight increase in upper lobe airspace disease suggests mild pulmonary edema. Electronically Signed   By: Genevive Bi M.D.   On: 05/17/2015 07:13   Dg Chest Port 1 View  05/16/2015  CLINICAL DATA:  64 year old female currently intubated EXAM: PORTABLE CHEST 1 VIEW COMPARISON:  Prior chest x-ray 05/15/2015 FINDINGS: The tip of the endotracheal tube is 2.4 cm above the carina. A nasogastric tube is present and is coiled within the gastric body. Left subclavian approach central venous catheter remains in unchanged position with the tip overlying the superior cavoatrial junction. Persistent low inspiratory volumes with bibasilar atelectasis versus infiltrate. Stable mild cardiomegaly. Slight blurring of the interstitial markings likely secondary to patient motion. Overall, no significant interval change in the appearance of the chest. No acute osseous abnormality. IMPRESSION: 1. The tip the endotracheal tube is 2.4 cm above the carina. Other support apparatus also in satisfactory and unchanged position. 2. No  significant interval change in the appearance of the lungs with persistent low inspiratory volumes and bibasilar atelectasis versus infiltrate. 3. Unchanged borderline cardiomegaly. Electronically Signed   By: Malachy Moan M.D.   On: Jun 02, 2015 09:29       ASSESSMENT / PLAN:  PULMONARY OETT 11/24 >06-02-15 A: Acute hypercarbic respiratory failure - failed BiPAP and required intubation 11/24. Probable OSA / OHS. Former smoker.   - doing  Well post extubation 11/26/116  P:  BiPAP to QHS VAP prevention measures. SBT when able. DuoNebs / Albuterol.   NEUROLOGIC A:   Acute metabolic encephalopathy - multifactorial in the setting of hypercarbia, sepsis. Hx anxiety, psychosis, schizophrenia, mild mental retardation.  - Normal WUA  P:   Sedation:   Fentanylto prn / Midazolam PRN. RASS goal: 0 to -1. Daily  WUA. Continue outpatient depakote (change to IV), lamictal. Assess levels of above. Hold outpatient chlorpromazine, lorazepam.  INFECTIOUS 05/13/15- MRSA PCR - negative. Nasal swab culture - negative 05/13/15- E Colii - quinolone and bacrim resistant. PCN/Cephalospiorin -sensitive 11/23- blood - single bottle coag negative staph  A:   Shock  - suspect septic due to UTI and questionable right breast cellulitis. L  - still on low dose levophed  ANTIBIOTICS: [place] 11/23 > 11/24 Vanc 11/23 >127 Imipenem 11/24 (e coill uti - see sensit/allergy) >>  Await sensit  CARDIOVASCULAR CVL L [place] 11/24 > R radial aline 11/23 > A:  Shock  - suspect septic due to UTI and questionable right breast cellulitis. Lactate reassuring. Hx HLD, MI, A.fib, PAF, combined heart failure (echo from Oct 2013 with EF 50-55%, grade 1 DD).  - still pressor dependent . ECHO 05/14/15 - ef 55%. Transiet recurrent A Fib RVR 07/16/14 AM P:  Serial Trop 05/17/15 Norepinephrine as needed to maintain goal MAP > 65. Stress dose sterouids to get her off levophed - since 05/17/15 Goal CVP 8 - 12. Continue outpatient ASA. Hold outpatient furosemide, lisinopril.  RENAL A:   AKI - likely pre-renal from hypovolemia. \Pseudohypocalcemia - corrects to 9.84.   - AKI resolved. P:   NS @ 125.-> reduce to 1 cc/h BMP in AM.  GASTROINTESTINAL A:   GI prophylaxis. Nutrition. Morbid obesity.  - d1 diet passed on swallow Jun 02, 2015  P:   D1 diet with thick iliquids SUP: Pantoprazole.   HEMATOLOGIC A:   Mild anemia - chronic. VTE Prophylaxis. P:  Transfuse for Hgb < 7. SCD's / Lovenox. CBC in AM.  ENDOCRINE A:   Hx DM - not on meds. Hypothyroidism. P:   Assess Hgb A1c. SSI if glucose consistently > 180. Continue outpatient synthroid, change to IV formulation. Assess TSH.  DERM A stagge 2 sacral ulcer at admission reported by RN on 05/17/15 P Wound care consult   Family updated: None. At  bedside   Interdisciplinary Family Meeting v Palliative Care Meeting:  Due by: 11/30.   GLOBAL S/p good extubastyion. Still on low pressos. Wil ask TRH to be primary from 05/18/15 and PCCM overlap    The patient is critically ill with multiple organ systems failure and requires high complexity decision making for assessment and support, frequent evaluation and titration of therapies, application of advanced monitoring technologies and extensive interpretation of multiple databases.   Critical Care Time devoted to patient care services described in this note is  35  Minutes. This time reflects time of care of this signee Dr Kalman Shan. This critical care time does not reflect procedure time, or teaching time or supervisory  time of PA/NP/Med student/Med Resident etc but could involve care discussion time    Dr. Kalman Shan, M.D., [facility].C.P Pulmonary and Critical Care Medicine Staff Physician [facility] [place] [facility]monary and Critical Care Pager: [phone], If no answer or between  15:00h - 7:00h: call [phone]  05/17/2015 10:28 AM

## 2015-05-17 NOTE — Progress Notes (Signed)
Cardiac Monitoring Event  Dysrhythmia:  Atrial fibrillation w/ RVR, rate 130s-150s sustained x25 mins, then spontaneous return to NSR w/ HR 90s  Symptoms:  Asymptomatic  Level of Consciousness:  Alert  Last set of vital signs taken:  Temp: 97.5 F (36.4 C)  Pulse Rate: 93  Resp: 15  BP: 103/65 mmHg  SpO2: 98 %  Name of MD Notified:  ELINK/MD Ramaswamy paged & notified on rounds  Time MD Notified:  Paged @ [phone]  Comments/Actions Taken:  EKG performed

## 2015-05-17 NOTE — Plan of Care (Signed)
Problem: Phase I Progression Outcomes Goal: Pain controlled with appropriate interventions Outcome: Completed/Met Date Met:  05/17/15 Pain at a controlled level with PRN pain medication available Goal: Patient tolerating nututrition at goal Outcome: Completed/Met Date Met:  05/17/15 Patient tolerating dysphagia 1 diet with nectar thick liquids Goal: Patient tolerating weaning plan Outcome: Completed/Met Date Met:  05/17/15 Patient extubated Goal: Tracheostomy by Vent Day 14 Outcome: Not Applicable Date Met:  00/86/76 Patient extubated         Goal: Optimized method of communication. Outcome: Completed/Met Date Met:  05/17/15 Patient communicating verbally since extubation  Problem: Phase II Progression Outcomes Goal: Date pt extubated/weaned off vent Outcome: Completed/Met Date Met:  05/17/15 Patient extubated 11/26 Goal: Time pt extubated/weaned off vent Outcome: Completed/Met Date Met:  05/17/15 Patient extubated 11/26 at 1100 Goal: Tolerating prescribed nutrition plan Outcome: Completed/Met Date Met:  05/17/15 Patient tolerating dysphagia 1 diet with nectar thick liquids   Goal: Pain controlled Outcome: Completed/Met Date Met:  05/17/15 Pain controlled-PRN pain medication available

## 2015-05-17 NOTE — Progress Notes (Signed)
Key Points: Use following P&T approved IV to PO antibiotic change policy.  PHARMACIST - PHYSICIAN COMMUNICATION DR:   Marchelle Gearing CONCERNING: IV to Oral Route Change Policy  RECOMMENDATION: This patient is receiving Depakote by the intravenous route.  Based on criteria approved by the Pharmacy and Therapeutics Committee, the intravenous medication(s) is/are being converted to the equivalent oral dose form(s).  Changed to LIQUID via tube in two divided doses for total of 1250mg /day.  Once able to swallow tablet can change back to extended release tablet at bedtime.   DESCRIPTION: These criteria include:  The patient is eating (either orally or via tube) and/or has been taking other orally administered medications for a least 24 hours  The patient has no evidence of active gastrointestinal bleeding or impaired GI absorption (gastrectomy, short bowel, patient on TNA or NPO).  If you have questions about this conversion, please contact the Pharmacy Department  []   [phone] )  [facility] []   [phone] )  [facility] []   [phone] )  [facility] []   [phone] )  [facility] [x]   [phone] )  [facility]   Emily Filbert [place], [facility] 05/17/2015 11:50 AM

## 2015-05-17 NOTE — Progress Notes (Signed)
Pharmacy Antibiotic Time-Out Note  [name] is a 64 y.o. year-old female admitted on 05/13/2015.  The patient is currently on Primaxin for cellulitis R breast and UTI.  Assessment/Plan: Patient has PCN allergy but per [facility] records has tolerated Rocephin in the past.  Discussed with Dr Marchelle Gearing and will narrow antibiotics to Rocephin 2gm IV q24h.  Temp (24hrs), Avg:98.2 F (36.8 C), Min:97.5 F (36.4 C), Max:98.7 F (37.1 C)   Recent Labs Lab 05/13/15 1257 05/14/15 0405 05/15/15 0502 05/16/15 0528 05/17/15 0625  WBC 6.6 6.0 6.7 7.5 7.3     Recent Labs Lab 05/14/15 0405 05/15/15 0502 05/15/15 0900 05/16/15 0528 05/17/15 0625  CREATININE 2.62* 1.17* 1.06* 0.95 0.80   Estimated Creatinine Clearance: 120.3 mL/min (by C-G formula based on Cr of 0.8).   Antimicrobial allergies: PCN-hives.  She has tolerated Rocephin & Keflex previously per [facility] records; sulfa- hives  Antimicrobials this admission: 07/13/2014 >> vanc >>11/27 05/14/2015 >> primax >>11/27 11/27>>Rocephin>>  Levels/dose changes this admission: 11/25: Primaxin dose increased to 500mg  IV q8h due to improved renal fxn.   Microbiology Results: 11/23 Blood x2: 1/2 CNS (contaminant) 11/23: urine cx: +Ecoli  Thank you for allowing pharmacy to be a part of this patient's care.  Elson Clan PharmD 05/17/2015 11:35 AM

## 2015-05-18 DIAGNOSIS — I480 Paroxysmal atrial fibrillation: Secondary | ICD-10-CM

## 2015-05-18 DIAGNOSIS — Z789 Other specified health status: Secondary | ICD-10-CM

## 2015-05-18 DIAGNOSIS — E039 Hypothyroidism, unspecified: Secondary | ICD-10-CM

## 2015-05-18 DIAGNOSIS — E662 Morbid (severe) obesity with alveolar hypoventilation: Secondary | ICD-10-CM

## 2015-05-18 DIAGNOSIS — J449 Chronic obstructive pulmonary disease, unspecified: Secondary | ICD-10-CM

## 2015-05-18 LAB — CBC WITH DIFFERENTIAL/PLATELET
BASOS PCT: 0 %
Basophils Absolute: 0 10*3/uL (ref 0.0–0.1)
EOS ABS: 0 10*3/uL (ref 0.0–0.7)
EOS PCT: 0 %
HEMATOCRIT: 34.4 % — AB (ref 36.0–46.0)
HEMOGLOBIN: 10.2 g/dL — AB (ref 12.0–15.0)
LYMPHS ABS: 0.8 10*3/uL (ref 0.7–4.0)
Lymphocytes Relative: 14 %
MCH: 30 pg (ref 26.0–34.0)
MCHC: 29.7 g/dL — ABNORMAL LOW (ref 30.0–36.0)
MCV: 101.2 fL — AB (ref 78.0–100.0)
MONO ABS: 0.3 10*3/uL (ref 0.1–1.0)
MONOS PCT: 5 %
Neutro Abs: 4.2 10*3/uL (ref 1.7–7.7)
Neutrophils Relative %: 81 %
Platelets: 197 10*3/uL (ref 150–400)
RBC: 3.4 MIL/uL — ABNORMAL LOW (ref 3.87–5.11)
RDW: 17 % — ABNORMAL HIGH (ref 11.5–15.5)
WBC: 5.2 10*3/uL (ref 4.0–10.5)

## 2015-05-18 LAB — MAGNESIUM: Magnesium: 1.9 mg/dL (ref 1.7–2.4)

## 2015-05-18 LAB — TROPONIN I: Troponin I: <0.03 ng/mL (ref <0.031)

## 2015-05-18 LAB — GLUCOSE, CAPILLARY
GLUCOSE-CAPILLARY: 106 mg/dL — AB (ref 65–99)
GLUCOSE-CAPILLARY: 109 mg/dL — AB (ref 65–99)
GLUCOSE-CAPILLARY: 130 mg/dL — AB (ref 65–99)
GLUCOSE-CAPILLARY: 130 mg/dL — AB (ref 65–99)
GLUCOSE-CAPILLARY: 132 mg/dL — AB (ref 65–99)
GLUCOSE-CAPILLARY: 90 mg/dL (ref 65–99)

## 2015-05-18 LAB — BASIC METABOLIC PANEL
ANION GAP: 3 — AB (ref 5–15)
BUN: 13 mg/dL (ref 6–20)
CO2: 31 mmol/L (ref 22–32)
Calcium: 9.1 mg/dL (ref 8.9–10.3)
Chloride: 103 mmol/L (ref 101–111)
Creatinine, Ser: 0.75 mg/dL (ref 0.44–1.00)
GFR calc Af Amer: >60 mL/min (ref >60)
GFR calc non Af Amer: >60 mL/min (ref >60)
GLUCOSE: 118 mg/dL — AB (ref 65–99)
POTASSIUM: 4.9 mmol/L (ref 3.5–5.1)
Sodium: 137 mmol/L (ref 135–145)

## 2015-05-18 LAB — CULTURE, BLOOD (ROUTINE X 2): Culture: NO GROWTH

## 2015-05-18 LAB — PHOSPHORUS: Phosphorus: 3.1 mg/dL (ref 2.5–4.6)

## 2015-05-18 MED ORDER — HALOPERIDOL LACTATE 5 MG/ML IJ SOLN
1.0000 mg | INTRAMUSCULAR | Status: DC | PRN
  Administered 2015-05-20: 2 mg via INTRAVENOUS
  Filled 2015-05-18: qty 1

## 2015-05-18 MED ORDER — HYDROCORTISONE NA SUCCINATE PF 100 MG IJ SOLR
50.0000 mg | Freq: Two times a day (BID) | INTRAMUSCULAR | Status: AC
  Administered 2015-05-18 – 2015-05-19 (×3): 50 mg via INTRAVENOUS
  Filled 2015-05-18 (×3): qty 2

## 2015-05-18 MED ORDER — PRO-STAT SUGAR FREE PO LIQD
30.0000 mL | Freq: Three times a day (TID) | ORAL | Status: DC
  Administered 2015-05-19 – 2015-05-21 (×6): 30 mL via ORAL
  Filled 2015-05-18 (×10): qty 30

## 2015-05-18 NOTE — Progress Notes (Signed)
Date: May 18, 2015 Chart reviewed for concurrent status and case management needs. Will continue to follow patient for changes and needs: Rhonda Davis, RN, BSN, CCM   [phone] 

## 2015-05-18 NOTE — Progress Notes (Addendum)
Nutrition Follow-up  DOCUMENTATION CODES:   Morbid obesity  INTERVENTION:   Continue Prostat liquid protein PO 30 ml TID with meals, each supplement provides 100 kcal, 15 grams protein. Encourage PO intake RD to monitor for additional needs  NUTRITION DIAGNOSIS:   Inadequate oral intake related to inability to eat as evidenced by NPO status.  Resolved, eating 75%.  GOAL:   Patient will meet greater than or equal to 90% of their needs  Progressing.  MONITOR:   PO intake, Labs, Weight trends, Skin, I & O's  ASSESSMENT:   64 yo female, morbidly obese, COPD and OHS, HTN, hypothyroidism, schizoaffective disorder, bipolar, presented to [facility] ED with main concern of several days duration or right breat area pain and swelling. Please note that pt is agitated and unable to provide any meaning full information at the time of the admission. 11/26: pt extubated  SLP evaluated, recommended dysphagia I with nectar thick liquids. Pt eating 75% of meals a this time. RD to monitor for any additional needs.  Labs reviewed: CBGs: 90-132 Mg/Phos WNL  Diet Order:  DIET - DYS 1 Room service appropriate?: Yes; Fluid consistency:: Nectar Thick  Skin:  Wound (see comment) (multiple posterior wounds)  Last BM:  11/26  Height:   Ht Readings from Last 1 Encounters:  05/13/15 5\' 2"  (1.575 m)    Weight:   Wt Readings from Last 1 Encounters:  05/18/15 418 lb (189.604 kg)    Ideal Body Weight:  50 kg  BMI:  Body mass index is 76.43 kg/(m^2).  Estimated Nutritional Needs:   Kcal:  2000-2200  Protein:  75-85g  Fluid:  2L/day  EDUCATION NEEDS:   No education needs identified at this time  Tilda Franco, MS, RD, LDN Pager: [phone] After Hours Pager: [phone]

## 2015-05-18 NOTE — Consult Note (Signed)
WOC wound consult note Reason for Consult:ASed to reconsult to evaluate bleeding area on lower right buttock.  New area of moisture associated skin damage and partial thickness tissue loss secondary to friction. No sacral pressure ulcer noted. Wound type:moisture associated skin damage, specifically incontinence associated dermatitis with partial thickness tissue loss due to friction from cleansing stool and repositioning. Five members of nursing team are needed to turn, clean, dress wounds and reposition today. Pressure Ulcer POA: No Measurement:2cm x 1.5cm x 0.1cm Wound bed:red, moist Drainage (amount, consistency, odor) serosanguinous Periwound:erythematous after cleansing from large amount of fecal incontinence Dressing procedure/placement/frequency:Patient taught that she needs to position off of tailbone, cognitive, disease and emotional state make it difficult to determine if she understands teaching. Entire body skin inspection with cleansing of perineal area following large, soft stool and dressing of upper body wounds noted on initial assessment performed.  Heel boots placed.  Patient is refusing to wear gown. Left LE rotates laterally and knee remains bent. Body habitus makes it very difficult to turn and reposition, even in a therapeutic bariatric bed. This, coupled with fecal incontinence, mental illness and need to keep HOB elevated for respiratory status make skin integrity maintenance very difficult and a challenge. WOC nursing team will not follow, but will remain available to this patient, the nursing and medical teams.  Please re-consult if needed. Thanks, Ladona Mow, MSN, RN, GNP, [place]  Pager# [phone]

## 2015-05-18 NOTE — Progress Notes (Signed)
Speech Language Pathology Treatment: Dysphagia  Patient Details Name: [name] MRN: [id number] DOB: Jan 07, 1951 Today's Date: 05/18/2015 Time: 1102-1117 SLP Time Calculation (min) (ACUTE ONLY): 22 min  Assessment / Plan / Recommendation Clinical Impression  F/u after initial swallow assessment.  Pt on 4l [place], oxygenating in 90s.  Consumed 75% of breakfast per RN and tolerated well.  With assist from staff, pt repositioned in bed to maximize safest posture for PO intake. Remained reclined, despite best efforts, due to her size and impulsivity.  Pt consumed honey-thick liquids, trials of nectar, and purees with no overt s/s of aspiration.  Thin liquids continue to elicit wet voice and cough, concerning for aspiration.  Requires min verbal cues for safety, but pt unable to recall precautions - continues with confusion, talking to others who are not present.  Pt with hx of impaired swallow with acute illness - recommend advancing liquids to nectar; continue Dysphagia 1.  SLP will follow for safety/readiness for diet advancement.    HPI HPI: [name] is a 64 y.o. female with a PMH including schizoaffective disorder, tardive dyskinesia, GERD, who resides at [facility]. She presented to [facility] ED 11/23 due to right breast area pain and swelling.  She was admitted for AMS / acute encephalopathy of unclear etiology but possibly due to schizoaffective disorder, bipolar disorder, AKI with hyperkalemia, dehydration, cellulitis of right breast. Later that evening, she became hypoxic and later more altered, she was placed on BiPAP. She ultimately failed BiPAP and was intubated on 05/14/15 until 05/16/15. Was previously seen by SLP in 5/15. Report states pt was consuming a regualr diet and thin liquids prior to that admission, but consume puree and nectar due to significant oral dysphagia, worsened by mental status change.       SLP Plan  Continue with current plan of care     Recommendations  Diet  recommendations: Dysphagia 1 (puree);Nectar-thick liquid Liquids provided via: Cup Medication Administration: Whole meds with puree Supervision: Patient able to self feed;Staff to assist with self feeding Compensations: Slow rate;Small sips/bites Postural Changes and/or Swallow Maneuvers:  (upright as much as possible)              Oral Care Recommendations: Oral care BID Follow up Recommendations:  (tba) Plan: Continue with current plan of care  [name], [place] CCC/SLP Pager [phone]  [name] 05/18/2015, 10:36 AM

## 2015-05-18 NOTE — Progress Notes (Signed)
PULMONARY / CRITICAL CARE MEDICINE   Name: [name] MRN: [id number] DOB: 30-Mar-1951    ADMISSION DATE:  05/13/2015 CONSULTATION DATE:  05/13/15  REFERRING MD :  Izola Price  CHIEF COMPLAINT:  AMS  BRIEF. [name] is a 64 y.o. female with a with schizoaffective who was admitted on 11/23 with hypercapneic respiratory failure, shock, and cellulitis and a UTI.  She required intubation, was extubated on 11/26.   PMH reviewed from chart.  Prior notes reviewed.   LINES/TUBES: ETT 11/24 > 11/26 CVL L [place] 11/24 > R radial aline 11/23 >   SIGNIFICANT EVENTS: 11/23 > admitted with AMS. 11/24 > transferred to SDU with worsening mental status.  Required intubation after failing BiPAP. 05/15/15 - now on TF. On levophed. Positive GPC blood cultures and GNR in urine 05/15/15 - E Colii in urnie. On levophed 56mcg/min. Off sedation gtt -> normal wua, Asking for extubation. Reports being bed/chair riodden at SNF. Doing PSV at high 15/5 level  SUBJECTIVE/OVERNIGHT/INTERVAL HX 05/18/15 - s/p extubation x 48h. Again refusing BIPAP, again requesting tobacco products.  Denies pain or dyspnea  VITAL SIGNS: BP 86/47 mmHg  Pulse 95  Temp(Src) 98.4 F (36.9 C) (Oral)  Resp 32  Ht  (1.575 m)  Wt 418 lb (189.604 kg)  BMI 76.43 kg/m2  SpO2 93%  HEMODYNAMICS:    VENTILATOR SETTINGS: Vent Mode:  [-]  FiO2 (%):  [40 %] 40 %  INTAKE / OUTPUT: I/O last 3 completed shifts: In: 3065.3 [P.O.:1140; I.V.:1312.8; IV Piggyback:612.5] Out: 1675 [Urine:1675]  PHYSICAL EXAMINATION: General: obese, no distress HENT: NCAT EOMi PULM: CTA B, normal effort CV: RRR, intermittent PVC, distant heart sounds GI: soft, nontender, BS+ MSK: no bony deformities Neuro: eyes closed, she was having a conversation despite the room being empty, she awoke to my voice and answered questions, moved all four ext  LABS:  PULMONARY  Recent Labs Lab 05/14/15 0155 05/14/15 0343 05/14/15 0611  05/16/15 0358  PHART 7.201* 7.158* 7.216* 7.408  PCO2ART 82.1* 88.1* 75.5* 45.6*  PO2ART 76.3* 365* 152* 76.0*  HCO3 31.1* 29.9* 29.4* 28.2*  TCO2 30.3 29.3 28.3 26.1  O2SAT 91.5 99.5 98.5 94.2    CBC  Recent Labs Lab 05/16/15 0528 05/17/15 0625 05/18/15 0315  HGB 10.3* 10.5* 10.2*  HCT 33.6* 36.3 34.4*  WBC 7.5 7.3 5.2  PLT 219 194 197    COAGULATION No results for input(s): INR in the last 168 hours.  CARDIAC    Recent Labs Lab 05/14/15 0830 05/17/15 1245 05/17/15 2055 05/18/15 0315  TROPONINI <0.03 <0.03 <0.03 <0.03   No results for input(s): PROBNP in the last 168 hours.   CHEMISTRY  Recent Labs Lab 05/13/15 2115  05/15/15 0502 05/15/15 0900 05/16/15 0528 05/17/15 0625 05/18/15 0315  NA  --   < > 137 136 138 139 137  K  --   < > 4.2 4.1 4.2 4.5 4.9  CL  --   < > 101 103 103 104 103  CO2  --   < > GLUCOSE  --   < > 100* 84 100* 103* 118*  BUN  --   < > 40* 34* 24* 15 13  CREATININE  --   < > 1.17* 1.06* 0.95 0.80 0.75  CALCIUM  --   < > 9.0 8.9 8.9 9.2 9.1  MG 1.9  --  1.6*  --  2.1 1.9 1.9  PHOS 3.9  --  1.6*  --  2.5 3.5 3.1  < > = values in this interval not displayed. Estimated Creatinine Clearance: 121.9 mL/min (by C-G formula based on Cr of 0.75).   LIVER  Recent Labs Lab 05/14/15 0405  AST 19  ALT 15  ALKPHOS 56  BILITOT 0.6  PROT 5.5*  ALBUMIN 2.7*     INFECTIOUS  Recent Labs Lab 05/13/15 1304 05/14/15 0405 05/14/15 0830 05/15/15 0502 05/16/15 0528  LATICACIDVEN 0.68 0.4* 0.6  --   --   PROCALCITON  --   --  0.11 <0.10 0.10     ENDOCRINE CBG (last 3)   Recent Labs  05/17/15 1541 05/17/15 2034 05/18/15 0037  GLUCAP 99 132* 130*         IMAGING x48h  - image(s) personally visualized  -   highlighted in bold Dg Chest Port 1 View  05/17/2015  CLINICAL DATA:  Endotracheal tube.  COPD.  Asthma EXAM: PORTABLE CHEST 1 VIEW COMPARISON:  05/16/2015 FINDINGS: Interval extubation and removal  of NG tube. Stable enlarged cardiac silhouette. There is mild increase in fine airspace disease in the upper lobes. Low lung volumes. No increase in atelectasis. LEFT central venous line unchanged. IMPRESSION: 1. Stable lung volumes following extubation. 2. Slight increase in upper lobe airspace disease suggests mild pulmonary edema. Electronically Signed   By: Genevive Bi M.D.   On: 05/17/2015 07:13       ASSESSMENT / PLAN:  PULMONARY OETT 11/24 >05/16/15 A: Chronic hypercarbic respiratory failure - refusing BIPAP, but no respiratory complaints 11/28 Probable OSA / OHS Former smoker  P:   Recommend BiPAP to QHS, I re-iterated that today Needs outpatient sleep study Duoneb prn  NEUROLOGIC A:   Acute metabolic encephalopathy - multifactorial in the setting of hypercarbia, sepsis > improving, now with hallucinations likely due to baseline mental illness Hx anxiety, psychosis, schizophrenia, mild mental retardation.  P:   Sedation:   Continue outpatient depakote, lamictal. Would continue to hold outpatient chlorpromazine, lorazepam  INFECTIOUS 05/13/15- MRSA PCR - negative. Nasal swab culture - negative 05/13/15- E Colii - quinolone and bacrim resistant. 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 Afib transient 1/27 P:  Wean off stress dose steroids Continue outpatient ASA Hold outpatient furosemide, lisinopril today, would likely restart tomorrow  RENAL A:   AKI - resolved  P:   Monitor BMET and UOP Replace electrolytes as  needed  GASTROINTESTINAL A:   GERD Nutrition Morbid obesity  P:   D1 diet with thick iliquids Pantoprazole    HEMATOLOGIC A:   Mild anemia - chronic VTE Prophylaxis P:  Transfuse for Hgb < 7 SCD's / Lovenox   ENDOCRINE A:   Hx DM - not on meds Hypothyroidism P:   Per [facility]  DERM A stagge 2 sacral ulcer at admission reported by RN on 05/17/15 P Wound care consult   Family updated: None at bedside   Interdisciplinary Family Meeting v Palliative Care Meeting:  Due by: 11/30.  PCCM to sign off  Heber Raymond, MD [facility] Pager: [phone] Cell: [phone] After 3pm or if no  response, call [phone]   05/18/2015 7:52 AM

## 2015-05-18 NOTE — Progress Notes (Signed)
Due to patients incontinence of urine, morbid obesity (>400 lbs), inability to turn or assist with turning, and moisture associated skin damage located on patients sacrum the foley catheter has been kept in place. At this time it is felt that removal of the urinary catheter would increase the patients likely hood of further skin breakdown to the sacrum. Bosie Helper, RN

## 2015-05-18 NOTE — Progress Notes (Signed)
   05/18/15 1000  Clinical Encounter Type  Visited With Patient  Visit Type Initial;Psychological support;Spiritual support;Critical Care  Referral From Chaplain  Consult/Referral To Chaplain  Spiritual Encounters  Spiritual Needs [facility] text;Emotional;Other (Comment) Veterinary surgeon)  Stress Factors  Patient Stress Factors None identified;Other (Comment) (Unable to Assess)   The Chaplain visited with the patient per request from the weekend Chaplain to take the patient a Bible. The patient was nude upon the Chaplain's arrival and covered up with the sheet. Her gown was on the bottom of the bed. The patient seemed confused and was unable to talk coherently. The chaplain was able pick out bits and pieces. Patient requested to see her doctor about dialysis.  Chaplain gave patient Bible, prayer shawl and bone pillow. Chaplain will follow-up with the patient as needed.

## 2015-05-18 NOTE — Progress Notes (Signed)
[facility] PROGRESS NOTE   [name]:[id number] DOB: [birth year] DOA: 05/13/2015 PCP: Margit Hanks, MD  HPI/Subjective: Patient is hard to understand, denies any fever or chills, SOB or pain. PCCM overlapping as consult.  Assessment/Plan: Principal Problem:   Acute encephalopathy Active Problems:   Obstructive sleep apnea   Morbid obesity (HCC)   Atrial fibrillation (HCC)   Hypothyroidism   Hypercapnic respiratory failure, chronic (HCC)   COPD (chronic obstructive pulmonary disease) (HCC)   Schizoaffective disorder, bipolar type (HCC)   Benign hypertensive heart disease without heart failure   Cellulitis of right breast   Acute renal failure (HCC)   Hyperkalemia   Hyponatremia   Acute and chronic respiratory failure with hypercapnia (HCC)   Septic shock (HCC)   Acute respiratory failure (HCC)    Septic shock Patient presented to the hospital with acute illness metabolic encephalopathy likely secondary to sepsis. Initially this was thought to be secondary to cellulitis of the right breast, turn out patient also has UTI. Patient was on vasopressors support, currently on hydrocortisone, try to wean off. This is improving, blood pressure still low.  Acute on chronic hypercapnic respiratory failure Has hypoventilation syndrome (pickwickian) secondary to her body habitus, she super obese with weight of 418 pounds Ventilator dependent respiratory failure, on admission patient developed acute metabolic encephalopathy. Her ABG showed pH of 7.201/PCO2 of 82/PO2 of 76, patient intubated on the 24th. Improved after mechanical ventilation PCO2 will last ABG was 45.6 mmHg  Escherichia coli UTI This is probably the cause of the septic shock along with the right breast cellulitis. Urinalysis showed UTI, Escherichia coli susceptible to penicillins/cephalosporins.  Acute metabolic encephalopathy Likely secondary to sepsis and acute hypercapnic respiratory  failure (CO2 narcosis) This is improved, patient is back to her baseline. Patient has previously stroke has some dysarthria.  Cellulitis of right breast Was initially on clindamycin, switched to Zosyn and vancomycin while she was in the ICU. Currently on Rocephin. This is improved.  Schizoaffective disorder, bipolar type (HCC) Continue home medications  Acute renal failure (HCC) Patient is on Lasix and losartan, both held on admission and was started on IV fluids. This is likely prerenal etiology secondary to sepsis synergize by diuretics/losartan. This is resolved, patient has massive anasarca.  Hyperkalemia Resolved  Morbid obesity (HCC) - BMI > 40  Atrial fibrillation (HCC) Rate controlled for now. Patient is on aspirin. Not on anticoagulation, will discuss with her initiation of anticoagulation. This patients CHA2DS2-VASc Score and unadjusted Ischemic Stroke Rate (% per year) is equal to 4.8 % stroke rate/year from a score of 4 for HTN, DM, CHF and being female. Above score calculated as 1 point each if present [CHF, HTN, DM, Vascular=MI/PAD/Aortic Plaque, Age if 65-74, or Female] Above score calculated as 2 points each if present [Age > 75, or Stroke/TIA/TE]  Hypothyroidism Continue synthroid   Benign hypertensive heart disease without heart failure Hold Lasix and Losartan   Hyponatremia This is resolved.  Code Status: Full Code Family Communication: Plan discussed with the patient. Disposition Plan: Remains inpatient Diet: DIET - DYS 1 Room service appropriate?: Yes; Fluid consistency:: Honey Thick  Consultants:  Patient was under PCCM till 11/27, [facility] assumed care on 05/18/15  Procedures:  Placement of central venous catheter done by Dr. Molli Knock on 05/14/2015.  Intubation on 05/14/2015, extubation 05/16/2015.  Antibiotics:  Rocephin   Objective: Filed Vitals:   05/18/15 0700 05/18/15 0800  BP: 86/47   Pulse: 95   Temp:  98 F (36.7 C)  Resp: 32      Intake/Output Summary (Last 24 hours) at 05/18/15 1003 Last data filed at 05/18/15 0900  Gross per 24 hour  Intake 1257.44 ml  Output    890 ml  Net 367.44 ml   Filed Weights   05/16/15 0500 05/17/15 0500 05/18/15 0500  Weight: 183.253 kg (404 lb) 185.975 kg (410 lb) 189.604 kg (418 lb)    Exam: General: Alert and awake, oriented x3, not in any acute distress. HEENT: anicteric sclera, pupils reactive to light and accommodation, EOMI CVS: S1-S2 clear, no murmur rubs or gallops Chest: clear to auscultation bilaterally, no wheezing, rales or rhonchi Abdomen: soft nontender, nondistended, normal bowel sounds, no organomegaly Extremities: no cyanosis, clubbing or edema noted bilaterally Neuro: Cranial nerves II-XII intact, no focal neurological deficits  Data Reviewed: Basic Metabolic Panel:  Recent Labs Lab 05/13/15 2115  05/15/15 0502 05/15/15 0900 05/16/15 0528 05/17/15 0625 05/18/15 0315  NA  --   < > 137 136 138 139 137  K  --   < > 4.2 4.1 4.2 4.5 4.9  CL  --   < > 101 103 103 104 103  CO2  --   < > 27 26 28 31 31   GLUCOSE  --   < > 100* 84 100* 103* 118*  BUN  --   < > 40* 34* 24* 15 13  CREATININE  --   < > 1.17* 1.06* 0.95 0.80 0.75  CALCIUM  --   < > 9.0 8.9 8.9 9.2 9.1  MG 1.9  --  1.6*  --  2.1 1.9 1.9  PHOS 3.9  --  1.6*  --  2.5 3.5 3.1  < > = values in this interval not displayed. Liver Function Tests:  Recent Labs Lab 05/14/15 0405  AST 19  ALT 15  ALKPHOS 56  BILITOT 0.6  PROT 5.5*  ALBUMIN 2.7*   No results for input(s): LIPASE, AMYLASE in the last 168 hours. No results for input(s): AMMONIA in the last 168 hours. CBC:  Recent Labs Lab 05/13/15 1257 05/14/15 0405 05/15/15 0502 05/16/15 0528 05/17/15 0625 05/18/15 0315  WBC 6.6 6.0 6.7 7.5 7.3 5.2  NEUTROABS 3.7  --   --  3.9 3.4 4.2  HGB 11.2* 10.2* 10.2* 10.3* 10.5* 10.2*  HCT 37.0 34.1* 33.8* 33.6* 36.3 34.4*  MCV 98.9 100.9* 97.7 96.6 100.6* 101.2*  PLT 249 233 226 219 194  197   Cardiac Enzymes:  Recent Labs Lab 05/14/15 0830 05/17/15 1245 05/17/15 2055 05/18/15 0315  TROPONINI <0.03 <0.03 <0.03 <0.03   BNP (last 3 results) No results for input(s): BNP in the last 8760 hours.  ProBNP (last 3 results) No results for input(s): PROBNP in the last 8760 hours.  CBG:  Recent Labs Lab 05/17/15 1218 05/17/15 1541 05/17/15 2034 05/18/15 0037 05/18/15 0733  GLUCAP 90 99 132* 130* 90    Micro Recent Results (from the past 240 hour(s))  Culture, blood (routine x 2)     Status: None   Collection Time: 05/13/15  1:05 PM  Result Value Ref Range Status   Specimen Description BLOOD RIGHT ANTECUBITAL  Final   Special Requests BOTTLES DRAWN AEROBIC ONLY 5CC  Final   Culture  Setup Time   Final    GRAM POSITIVE COCCI IN CLUSTERS AEROBIC BOTTLE ONLY CRITICAL RESULT CALLED TO, READ BACK BY AND VERIFIED WITH: R JOHNSON 05/14/15 @ 1157 M VESTAL    Culture   Final    STAPHYLOCOCCUS  SPECIES (COAGULASE NEGATIVE) THE SIGNIFICANCE OF ISOLATING THIS ORGANISM FROM A SINGLE VENIPUNCTURE CANNOT BE PREDICTED WITHOUT FURTHER CLINICAL AND CULTURE CORRELATION. SUSCEPTIBILITIES AVAILABLE ONLY ON REQUEST. Performed at [facility]    Report Status 05/15/2015 FINAL  Final  Culture, blood (routine x 2)     Status: None (Preliminary result)   Collection Time: 05/13/15  1:10 PM  Result Value Ref Range Status   Specimen Description BLOOD LEFT ARM  Final   Special Requests BOTTLES DRAWN AEROBIC AND ANAEROBIC  Final   Culture   Final    NO GROWTH 4 DAYS Performed at [facility]    Report Status PENDING  Incomplete  MRSA PCR Screening     Status: Abnormal   Collection Time: 05/13/15  7:00 PM  Result Value Ref Range Status   MRSA by PCR INVALID RESULTS, SPECIMEN SENT FOR CULTURE (A) NEGATIVE Final    Comment: RESULT CALLED TO, READ BACK BY AND VERIFIED WITH: Sheppard Penton [id number] @ 2123 BY J SCOTTON        The GeneXpert MRSA Assay (FDA approved  for NASAL specimens only), is one component of a comprehensive MRSA colonization surveillance program. It is not intended to diagnose MRSA infection nor to guide or monitor treatment for MRSA infections.   MRSA culture     Status: None   Collection Time: 05/13/15  7:00 PM  Result Value Ref Range Status   Specimen Description NOSE  Final   Special Requests NONE  Final   Culture NOMRSA Performed at [facility]   Final   Report Status 05/15/2015 FINAL  Final  Urine culture     Status: None   Collection Time: 05/13/15  9:39 PM  Result Value Ref Range Status   Specimen Description URINE, CATHETERIZED  Final   Special Requests NONE  Final   Culture   Final    >=100,000 COLONIES/mL ESCHERICHIA COLI Performed at [facility]    Report Status 05/16/2015 FINAL  Final   Organism ID, Bacteria ESCHERICHIA COLI  Final      Susceptibility   Escherichia coli - MIC*    AMPICILLIN 4 SENSITIVE Sensitive     CEFAZOLIN <=4 SENSITIVE Sensitive     CEFTRIAXONE <=1 SENSITIVE Sensitive     CIPROFLOXACIN >=4 RESISTANT Resistant     GENTAMICIN <=1 SENSITIVE Sensitive     IMIPENEM <=0.25 SENSITIVE Sensitive     NITROFURANTOIN <=16 SENSITIVE Sensitive     TRIMETH/SULFA >=320 RESISTANT Resistant     AMPICILLIN/SULBACTAM <=2 SENSITIVE Sensitive     PIP/TAZO <=4 SENSITIVE Sensitive     * >=100,000 COLONIES/mL ESCHERICHIA COLI     Studies: Dg Chest Port 1 View  05/17/2015  CLINICAL DATA:  Endotracheal tube.  COPD.  Asthma EXAM: PORTABLE CHEST 1 VIEW COMPARISON:  05/16/2015 FINDINGS: Interval extubation and removal of NG tube. Stable enlarged cardiac silhouette. There is mild increase in fine airspace disease in the upper lobes. Low lung volumes. No increase in atelectasis. LEFT central venous line unchanged. IMPRESSION: 1. Stable lung volumes following extubation. 2. Slight increase in upper lobe airspace disease suggests mild pulmonary edema. Electronically Signed   By: Genevive Bi M.D.   On: 05/17/2015 07:13    Scheduled Meds: . antiseptic oral rinse  7 mL Mouth Rinse QID  . aspirin  81 mg Per Tube Daily  . cefTRIAXone (ROCEPHIN)  IV  2 g Intravenous Q24H  . chlorhexidine gluconate  15 mL Mouth Rinse BID  . collagenase  Topical Daily  . divalproex  1,250 mg Oral QHS  . enoxaparin (LOVENOX) injection  90 mg Subcutaneous Q24H  . feeding supplement (PRO-STAT SUGAR FREE 64)  30 mL Per Tube TID  . hydrocortisone sod succinate (SOLU-CORTEF) inj  50 mg Intravenous Q12H  . lamoTRIgine  100 mg Per Tube BID  . levothyroxine  150 mcg Oral QAC breakfast  . nicotine  21 mg Transdermal Daily  . pantoprazole  40 mg Oral Daily  . sodium chloride  3 mL Intravenous Q12H   Continuous Infusions: . sodium chloride 10 mL/hr at 05/18/15 0800       Time spent: 35 minutes    [facility]  [facility] Pager [phone] If 7PM-7AM, please contact night-coverage at [email or web address], password [facility] 05/18/2015, 10:03 AM  LOS: 4 days

## 2015-05-19 DIAGNOSIS — L8990 Pressure ulcer of unspecified site, unspecified stage: Secondary | ICD-10-CM | POA: Diagnosis present

## 2015-05-19 DIAGNOSIS — J9602 Acute respiratory failure with hypercapnia: Secondary | ICD-10-CM

## 2015-05-19 DIAGNOSIS — E662 Morbid (severe) obesity with alveolar hypoventilation: Secondary | ICD-10-CM | POA: Diagnosis present

## 2015-05-19 LAB — BLOOD GAS, ARTERIAL
ACID-BASE EXCESS: 3.1 mmol/L — AB (ref 0.0–2.0)
BICARBONATE: 30.3 meq/L — AB (ref 20.0–24.0)
DRAWN BY: 331471
O2 CONTENT: 4 L/min
O2 Saturation: 96.4 %
PATIENT TEMPERATURE: 98.6
TCO2: 28.8 mmol/L (ref 0–100)
pCO2 arterial: 65.2 mmHg (ref 35.0–45.0)
pH, Arterial: 7.289 — ABNORMAL LOW (ref 7.350–7.450)
pO2, Arterial: 91.2 mmHg (ref 80.0–100.0)

## 2015-05-19 LAB — CBC WITH DIFFERENTIAL/PLATELET
Basophils Absolute: 0 10*3/uL (ref 0.0–0.1)
Basophils Relative: 0 %
EOS ABS: 0 10*3/uL (ref 0.0–0.7)
Eosinophils Relative: 0 %
HEMATOCRIT: 36.8 % (ref 36.0–46.0)
HEMOGLOBIN: 10.6 g/dL — AB (ref 12.0–15.0)
LYMPHS ABS: 1.5 10*3/uL (ref 0.7–4.0)
LYMPHS PCT: 26 %
MCH: 29.6 pg (ref 26.0–34.0)
MCHC: 28.8 g/dL — AB (ref 30.0–36.0)
MCV: 102.8 fL — AB (ref 78.0–100.0)
MONOS PCT: 8 %
Monocytes Absolute: 0.4 10*3/uL (ref 0.1–1.0)
NEUTROS ABS: 3.6 10*3/uL (ref 1.7–7.7)
NEUTROS PCT: 66 %
Platelets: 248 10*3/uL (ref 150–400)
RBC: 3.58 MIL/uL — ABNORMAL LOW (ref 3.87–5.11)
RDW: 17.1 % — ABNORMAL HIGH (ref 11.5–15.5)
WBC: 5.5 10*3/uL (ref 4.0–10.5)

## 2015-05-19 LAB — BASIC METABOLIC PANEL
Anion gap: 4 — ABNORMAL LOW (ref 5–15)
BUN: 14 mg/dL (ref 6–20)
CHLORIDE: 105 mmol/L (ref 101–111)
CO2: 32 mmol/L (ref 22–32)
CREATININE: 0.86 mg/dL (ref 0.44–1.00)
Calcium: 9.7 mg/dL (ref 8.9–10.3)
GFR calc Af Amer: >60 mL/min (ref >60)
GFR calc non Af Amer: >60 mL/min (ref >60)
GLUCOSE: 99 mg/dL (ref 65–99)
POTASSIUM: 5.4 mmol/L — AB (ref 3.5–5.1)
Sodium: 141 mmol/L (ref 135–145)

## 2015-05-19 LAB — GLUCOSE, CAPILLARY
GLUCOSE-CAPILLARY: 123 mg/dL — AB (ref 65–99)
Glucose-Capillary: 89 mg/dL (ref 65–99)

## 2015-05-19 LAB — PHOSPHORUS: Phosphorus: 2.9 mg/dL (ref 2.5–4.6)

## 2015-05-19 LAB — MAGNESIUM: Magnesium: 2.4 mg/dL (ref 1.7–2.4)

## 2015-05-19 MED ORDER — FUROSEMIDE 10 MG/ML IJ SOLN
40.0000 mg | Freq: Once | INTRAMUSCULAR | Status: AC
  Administered 2015-05-19: 40 mg via INTRAVENOUS
  Filled 2015-05-19: qty 4

## 2015-05-19 MED ORDER — FUROSEMIDE 10 MG/ML IJ SOLN
60.0000 mg | Freq: Two times a day (BID) | INTRAMUSCULAR | Status: DC
  Administered 2015-05-19 – 2015-05-21 (×4): 60 mg via INTRAVENOUS
  Filled 2015-05-19 (×6): qty 6

## 2015-05-19 NOTE — Progress Notes (Addendum)
[facility] PROGRESS NOTE   [name] KDT:[id number] DOB: 1950/11/19 DOA: 05/13/2015 PCP: Margit Hanks, MD  HPI/Subjective: Very sleepy this morning, hard to arouse. Per nursing staff did not use her BiPAP overnight. ABG showed pH of 7.289 and PCO2 of 65, placed back on BiPAP.  Interval history: 64 year old with super obesity (BMI 72) significant psychiatric history including schizoaffective disorder, mild mental retardation, she is bed bound, nursing home resident, has obesity hypoventilation syndrome she was admitted initially under [facility] for breast cellulitis, patient developed altered mental status likely secondary to hypercapnia and CO2 narcosis and transferred to the ICU, intubated. Patient has UTI, hypotension requires vasopressors, transferred back to the [facility] service on 11/28. Currently she is okay, this morning she has hypercapnia again because she refused BiPAP overnight. Placed on CPAP for about 6 hours, back to baseline. Placed on Lasix for fluid retention and anasarca. Consider discharge back to nursing home before the weekend.  Assessment/Plan: Principal Problem:   Acute encephalopathy Active Problems:   Obstructive sleep apnea   Morbid obesity (HCC)   Atrial fibrillation (HCC)   Hypothyroidism   Hypercapnic respiratory failure, chronic (HCC)   COPD (chronic obstructive pulmonary disease) (HCC)   Schizoaffective disorder, bipolar type (HCC)   Benign hypertensive heart disease without heart failure   Cellulitis of right breast   Acute renal failure (HCC)   Hyperkalemia   Hyponatremia   Acute and chronic respiratory failure with hypercapnia (HCC)   Septic shock (HCC)   Acute respiratory failure (HCC)   Endotracheal tube present   Pressure ulcer  Filed Weights   05/17/15 0500 05/18/15 0500 05/19/15 0500  Weight: 185.975 kg (410 lb) 189.604 kg (418 lb) 188.243 kg (415 lb)    Septic shock Patient presented to the hospital with acute illness  metabolic encephalopathy likely secondary to sepsis. Initially this was thought to be secondary to cellulitis of the right breast, turn out patient also has UTI. Patient was on vasopressors support, currently on hydrocortisone, try to wean off. Blood pressure is better today.  Acute on chronic hypercapnic respiratory failure Has hypoventilation syndrome (pickwickian) secondary to her body habitus, she super obese with weight of 418 pounds, BMI of 72. Ventilator dependent respiratory failure, on admission patient developed acute metabolic encephalopathy. Her ABG showed pH of 7.201/PCO2 of 82/PO2 of 76, patient intubated on the 24th. PH 7.289 and PCO2 65.2, back on BiPAP, PCCM following as a consult, it BiPAP not helping she might need to be intubated again.  Escherichia coli UTI This is probably the cause of the septic shock along with the right breast cellulitis. Urinalysis showed UTI, Escherichia coli susceptible to penicillins/cephalosporins.  Anasarca Patient has market fluid overload, pitting edema involving her torso and extremities. Weight increased from 393 on admission to 415 pounds today. If charted weight is accurate and had over 22 pounds of weight gain, probably all fluids. IV Lasix given today, I'll put on scheduled Lasix, follow renal function closely as patient presented to the hospital with ARF. Check BMP in a.m.  Acute metabolic encephalopathy Likely secondary to sepsis and acute hypercapnic respiratory failure (CO2 narcosis) This is improved, patient is back to her baseline. Patient has previously stroke has some dysarthria.  Cellulitis of right breast Was initially on clindamycin, switched to Zosyn and vancomycin while she was in the ICU. Currently on Rocephin. This is improved.  Schizoaffective disorder, bipolar type (HCC) Continue home medications  Acute renal failure (HCC) Patient is on Lasix and losartan, both held on admission  and was started on IV  fluids. This is likely prerenal etiology secondary to sepsis synergize by diuretics/losartan. This is resolved, patient has massive anasarca.  Hyperkalemia Resolved  Morbid obesity (HCC) - BMI > 40  Atrial fibrillation (HCC) Rate controlled for now. Patient is on aspirin. Not on anticoagulation, did not discuss anticoagulation with her as she is sleepy today. This patients CHA2DS2-VASc Score and unadjusted Ischemic Stroke Rate (% per year) is equal to 4.8 % stroke rate/year from a score of 4 for HTN, DM, CHF and being female. Above score calculated as 1 point each if present [CHF, HTN, DM, Vascular=MI/PAD/Aortic Plaque, Age if 65-74, or Female] Above score calculated as 2 points each if present [Age > 75, or Stroke/TIA/TE]  Hypothyroidism Continue synthroid   Benign hypertensive heart disease without heart failure Hold Lasix and Losartan   Hyponatremia This is resolved.   Code Status: Full Code Family Communication: Plan discussed with the patient. Disposition Plan: Remains inpatient Diet: DIET - DYS 1 Room service appropriate?: Yes; Fluid consistency:: Nectar Thick  Consultants:  Patient was under PCCM till 11/27, [facility] assumed care on 05/18/15  Procedures:  Placement of central venous catheter done by Dr. Molli Knock on 05/14/2015.  Intubation on 05/14/2015, extubation 05/16/2015.  Antibiotics:  Rocephin   Objective: Filed Vitals:   05/19/15 0900 05/19/15 1000  BP: 116/69 121/73  Pulse: 87 87  Temp:    Resp: 23 19    Intake/Output Summary (Last 24 hours) at 05/19/15 1158 Last data filed at 05/19/15 1100  Gross per 24 hour  Intake    680 ml  Output    915 ml  Net   -235 ml   Filed Weights   05/17/15 0500 05/18/15 0500 05/19/15 0500  Weight: 185.975 kg (410 lb) 189.604 kg (418 lb) 188.243 kg (415 lb)    Exam: General: Alert and awake, oriented x3, not in any acute distress. HEENT: anicteric sclera, pupils reactive to light and accommodation, EOMI CVS:  S1-S2 clear, no murmur rubs or gallops Chest: clear to auscultation bilaterally, no wheezing, rales or rhonchi Abdomen: soft nontender, nondistended, normal bowel sounds, no organomegaly Extremities: no cyanosis, clubbing or edema noted bilaterally Neuro: Cranial nerves II-XII intact, no focal neurological deficits  Data Reviewed: Basic Metabolic Panel:  Recent Labs Lab 05/15/15 0502 05/15/15 0900 05/16/15 0528 05/17/15 0625 05/18/15 0315 05/19/15 0512  NA 137 136 138 139 137 141  K 4.2 4.1 4.2 4.5 4.9 5.4*  CL 101 103 103 104 103 105  CO2 32  GLUCOSE 100* 84 100* 103* 118* 99  BUN 40* 34* 24* CREATININE 1.17* 1.06* 0.95 0.80 0.75 0.86  CALCIUM 9.0 8.9 8.9 9.2 9.1 9.7  MG 1.6*  --  2.1 1.9 1.9 2.4  PHOS 1.6*  --  2.5 3.5 3.1 2.9   Liver Function Tests:  Recent Labs Lab 05/14/15 0405  AST 19  ALT 15  ALKPHOS 56  BILITOT 0.6  PROT 5.5*  ALBUMIN 2.7*   No results for input(s): LIPASE, AMYLASE in the last 168 hours. No results for input(s): AMMONIA in the last 168 hours. CBC:  Recent Labs Lab 05/13/15 1257  05/15/15 0502 05/16/15 0528 05/17/15 0625 05/18/15 0315 05/19/15 0512  WBC 6.6  < > 6.7 7.5 7.3 5.2 5.5  NEUTROABS 3.7  --   --  3.9 3.4 4.2 3.6  HGB 11.2*  < > 10.2* 10.3* 10.5* 10.2* 10.6*  HCT 37.0  < > 33.8*  33.6* 36.3 34.4* 36.8  MCV 98.9  < > 97.7 96.6 100.6* 101.2* 102.8*  PLT 249  < > 226 219 194 197 248  < > = values in this interval not displayed. Cardiac Enzymes:  Recent Labs Lab 05/14/15 0830 05/17/15 1245 05/17/15 2055 05/18/15 0315  TROPONINI <0.03 <0.03 <0.03 <0.03   BNP (last 3 results) No results for input(s): BNP in the last 8760 hours.  ProBNP (last 3 results) No results for input(s): PROBNP in the last 8760 hours.  CBG:  Recent Labs Lab 05/18/15 1151 05/18/15 1623 05/18/15 2005 05/18/15 2353 05/19/15 0404  GLUCAP 130* 106* 109* 123* 89    Micro Recent Results (from the past 240 hour(s))   Culture, blood (routine x 2)     Status: None   Collection Time: 05/13/15  1:05 PM  Result Value Ref Range Status   Specimen Description BLOOD RIGHT ANTECUBITAL  Final   Special Requests BOTTLES DRAWN AEROBIC ONLY 5CC  Final   Culture  Setup Time   Final    GRAM POSITIVE COCCI IN CLUSTERS AEROBIC BOTTLE ONLY CRITICAL RESULT CALLED TO, READ BACK BY AND VERIFIED WITH: R JOHNSON 05/14/15 @ 1157 M VESTAL    Culture   Final    STAPHYLOCOCCUS SPECIES (COAGULASE NEGATIVE) THE SIGNIFICANCE OF ISOLATING THIS ORGANISM FROM A SINGLE VENIPUNCTURE CANNOT BE PREDICTED WITHOUT FURTHER CLINICAL AND CULTURE CORRELATION. SUSCEPTIBILITIES AVAILABLE ONLY ON REQUEST. Performed at [facility]    Report Status 05/15/2015 FINAL  Final  Culture, blood (routine x 2)     Status: None   Collection Time: 05/13/15  1:10 PM  Result Value Ref Range Status   Specimen Description BLOOD LEFT ARM  Final   Special Requests BOTTLES DRAWN AEROBIC AND ANAEROBIC  Final   Culture   Final    NO GROWTH 5 DAYS Performed at [facility]    Report Status 05/18/2015 FINAL  Final  MRSA PCR Screening     Status: Abnormal   Collection Time: 05/13/15  7:00 PM  Result Value Ref Range Status   MRSA by PCR INVALID RESULTS, SPECIMEN SENT FOR CULTURE (A) NEGATIVE Final    Comment: RESULT CALLED TO, READ BACK BY AND VERIFIED WITH: Sheppard Penton [id number] @ 2123 BY J SCOTTON        The GeneXpert MRSA Assay (FDA approved for NASAL specimens only), is one component of a comprehensive MRSA colonization surveillance program. It is not intended to diagnose MRSA infection nor to guide or monitor treatment for MRSA infections.   MRSA culture     Status: None   Collection Time: 05/13/15  7:00 PM  Result Value Ref Range Status   Specimen Description NOSE  Final   Special Requests NONE  Final   Culture NOMRSA Performed at [facility]   Final   Report Status 05/15/2015 FINAL  Final  Urine culture      Status: None   Collection Time: 05/13/15  9:39 PM  Result Value Ref Range Status   Specimen Description URINE, CATHETERIZED  Final   Special Requests NONE  Final   Culture   Final    >=100,000 COLONIES/mL ESCHERICHIA COLI Performed at [facility]    Report Status 05/16/2015 FINAL  Final   Organism ID, Bacteria ESCHERICHIA COLI  Final      Susceptibility   Escherichia coli - MIC*    AMPICILLIN 4 SENSITIVE Sensitive     CEFAZOLIN <=4 SENSITIVE Sensitive     CEFTRIAXONE <=1  SENSITIVE Sensitive     CIPROFLOXACIN >=4 RESISTANT Resistant     GENTAMICIN <=1 SENSITIVE Sensitive     IMIPENEM <=0.25 SENSITIVE Sensitive     NITROFURANTOIN <=16 SENSITIVE Sensitive     TRIMETH/SULFA >=320 RESISTANT Resistant     AMPICILLIN/SULBACTAM <=2 SENSITIVE Sensitive     PIP/TAZO <=4 SENSITIVE Sensitive     * >=100,000 COLONIES/mL ESCHERICHIA COLI     Studies: No results found.  Scheduled Meds: . antiseptic oral rinse  7 mL Mouth Rinse QID  . aspirin  81 mg Per Tube Daily  . cefTRIAXone (ROCEPHIN)  IV  2 g Intravenous Q24H  . chlorhexidine gluconate  15 mL Mouth Rinse BID  . collagenase   Topical Daily  . divalproex  1,250 mg Oral QHS  . enoxaparin (LOVENOX) injection  90 mg Subcutaneous Q24H  . feeding supplement (PRO-STAT SUGAR FREE 64)  30 mL Oral TID  . hydrocortisone sod succinate (SOLU-CORTEF) inj  50 mg Intravenous Q12H  . lamoTRIgine  100 mg Per Tube BID  . levothyroxine  150 mcg Oral QAC breakfast  . nicotine  21 mg Transdermal Daily  . pantoprazole  40 mg Oral Daily  . sodium chloride  3 mL Intravenous Q12H   Continuous Infusions: . sodium chloride 10 mL/hr at 05/19/15 1000       Time spent: 35 minutes    [facility]  [facility] Pager [phone] If 7PM-7AM, please contact night-coverage at [email or web address], password [facility] 05/19/2015, 11:58 AM  LOS: 5 days

## 2015-05-19 NOTE — Progress Notes (Signed)
SLP Cancellation Note  Patient Details Name: GEORGE SWENEY MRN: [id number] DOB: 1950/10/20   Cancelled treatment:       Reason Eval/Treat Not Completed: Other (comment) (pt on bipap currently, RN reports pt with good tolerance of po diet)   Mickie Bail [place], MS [facility] SLP [phone]

## 2015-05-19 NOTE — Progress Notes (Signed)
Pt remains on NIV/BIPAP.          bipap

## 2015-05-19 NOTE — Progress Notes (Signed)
Patient refuses BIPAP at this time.Patient became extremely agitated. RT will try again later.

## 2015-05-19 NOTE — Progress Notes (Signed)
Rt placed pt on BIPAP per MD order due to ABG.

## 2015-05-19 NOTE — Progress Notes (Signed)
PT found off BiPAP on [place]- PT does not appear to be in respiratory distress at this time.

## 2015-05-20 DIAGNOSIS — N390 Urinary tract infection, site not specified: Secondary | ICD-10-CM

## 2015-05-20 DIAGNOSIS — B9620 Unspecified Escherichia coli [E. coli] as the cause of diseases classified elsewhere: Secondary | ICD-10-CM

## 2015-05-20 LAB — CBC
HEMATOCRIT: 32.8 % — AB (ref 36.0–46.0)
HEMOGLOBIN: 9.9 g/dL — AB (ref 12.0–15.0)
MCH: 30.1 pg (ref 26.0–34.0)
MCHC: 30.2 g/dL (ref 30.0–36.0)
MCV: 99.7 fL (ref 78.0–100.0)
Platelets: 220 10*3/uL (ref 150–400)
RBC: 3.29 MIL/uL — AB (ref 3.87–5.11)
RDW: 16.6 % — ABNORMAL HIGH (ref 11.5–15.5)
WBC: 5.1 10*3/uL (ref 4.0–10.5)

## 2015-05-20 LAB — BASIC METABOLIC PANEL
ANION GAP: 4 — AB (ref 5–15)
BUN: 16 mg/dL (ref 6–20)
CHLORIDE: 102 mmol/L (ref 101–111)
CO2: 34 mmol/L — AB (ref 22–32)
Calcium: 9.4 mg/dL (ref 8.9–10.3)
Creatinine, Ser: 0.98 mg/dL (ref 0.44–1.00)
GFR calc Af Amer: >60 mL/min (ref >60)
GFR calc non Af Amer: >60 mL/min (ref >60)
GLUCOSE: 95 mg/dL (ref 65–99)
POTASSIUM: 4.1 mmol/L (ref 3.5–5.1)
Sodium: 140 mmol/L (ref 135–145)

## 2015-05-20 NOTE — NC FL2 (Signed)
[place] MEDICAID FL2 LEVEL OF CARE SCREENING TOOL     IDENTIFICATION  Patient Name: [name] Birthdate: 04-01-1951 Sex: female Admission Date (Current Location): 05/13/2015  [facility] and [place] Number:     Facility and Address:  [facility],  [address], [place] [address]      Provider Number: [id number]  Attending Physician Name and Address:  Eddie North, MD  Relative Name and Phone Number:       Current Level of Care: Hospital Recommended Level of Care: Skilled Nursing Facility Prior Approval Number:    Date Approved/Denied:   PASRR Number:    Discharge Plan: SNF    Current Diagnoses: Patient Active Problem List   Diagnosis Date Noted  . Pressure ulcer 05/19/2015  . Pickwickian syndrome (HCC) 05/19/2015  . Endotracheal tube present   . Acute and chronic respiratory failure with hypercapnia (HCC) 05/14/2015  . Septic shock (HCC) 05/14/2015  . Acute respiratory failure (HCC) 05/14/2015  . Cellulitis of right breast 05/13/2015  . Acute renal failure (HCC) 05/13/2015  . Hyperkalemia 05/13/2015  . Hyponatremia 05/13/2015  . Acute encephalopathy 05/13/2015  . Benign hypertensive heart disease without heart failure 01/01/2015  . Schizoaffective disorder, bipolar type (HCC) 08/07/2014  . COPD (chronic obstructive pulmonary disease) (HCC) 04/19/2013  . Hypercapnic respiratory failure, chronic (HCC) 09/02/2012  . Atrial fibrillation (HCC) 03/28/2012  . Hypothyroidism 03/28/2012  . Diabetes mellitus (HCC) 03/28/2012  . Morbid obesity (HCC)   . Obstructive sleep apnea 01/22/2010  . LOW BACK PAIN SYNDROME 12/11/2007  . GERD 02/06/2007  . TARDIVE DYSKINESIA 08/10/2004    Orientation ACTIVITIES/SOCIAL BLADDER RESPIRATION    Self, Time, Place  Passive Incontinent, Indwelling catheter O2 (As needed)  BEHAVIORAL SYMPTOMS/MOOD NEUROLOGICAL BOWEL NUTRITION STATUS   (no behaviors)   Incontinent Diet (DYS 1)  PHYSICIAN VISITS COMMUNICATION  OF NEEDS Height & Weight Skin    Verbally  (157.5 cm) 409 lbs. PU Stage and Appropriate Care          AMBULATORY STATUS RESPIRATION    Assist extensive O2 (As needed)      Personal Care Assistance Level of Assistance   (no behaviors) Bathing Assistance: Maximum assistance Feeding assistance: Limited assistance Dressing Assistance: Maximum assistance      Functional Limitations Info  Sight, Hearing, Speech Sight Info: Adequate Hearing Info: Adequate Speech Info: Adequate       SPECIAL CARE FACTORS FREQUENCY                      Additional Factors Info  Code Status, Psychotropic Code Status Info: Full Code             Current Medications (05/20/2015):  This is the current hospital active medication list Current Facility-Administered Medications  Medication Dose Route Frequency Provider Last Rate Last Dose  . 0.9 %  sodium chloride infusion   Intravenous Continuous Kalman Shan, MD 10 mL/hr at 05/20/15 0900    . antiseptic oral rinse solution (CORINZ)  7 mL Mouth Rinse QID Kalman Shan, MD   7 mL at 05/20/15 0400  . aspirin chewable tablet 81 mg  81 mg Per Tube Daily Rahul P Desai, PA-C   81 mg at 05/20/15 1017  . bisacodyl (DULCOLAX) suppository 10 mg  10 mg Rectal Q72H PRN Dorothea Ogle, MD      . cefTRIAXone (ROCEPHIN) 2 g in dextrose 5 % 50 mL IVPB  2 g Intravenous Q24H Kalman Shan, MD   2 g  at 05/19/15 1518  . chlorhexidine gluconate (PERIDEX) 0.12 % solution 15 mL  15 mL Mouth Rinse BID Kalman Shan, MD   15 mL at 05/20/15 0813  . collagenase (SANTYL) ointment   Topical Daily Kalman Shan, MD      . divalproex (DEPAKOTE ER) 24 hr tablet 1,250 mg  1,250 mg Oral QHS Kalman Shan, MD   1,250 mg at 05/19/15 2122  . enoxaparin (LOVENOX) injection 90 mg  90 mg Subcutaneous Q24H Alyson Reedy, MD   90 mg at 05/19/15 2122  . feeding supplement (PRO-STAT SUGAR FREE 64) liquid 30 mL  30 mL Oral TID Tilda Franco, RD   30 mL at 05/20/15  1019  . fentaNYL (SUBLIMAZE) injection 12.5 mcg  12.5 mcg Intravenous Q2H PRN Kalman Shan, MD      . furosemide (LASIX) injection 60 mg  60 mg Intravenous BID Clydia Llano, MD   60 mg at 05/20/15 0809  . haloperidol lactate (HALDOL) injection 1-4 mg  1-4 mg Intravenous Q3H PRN Lupita Leash, MD   2 mg at 05/20/15 0025  . ipratropium-albuterol (DUONEB) 0.5-2.5 (3) MG/3ML nebulizer solution 3 mL  3 mL Nebulization Q6H PRN Rahul P Desai, PA-C      . lamoTRIgine (LAMICTAL) tablet 100 mg  100 mg Per Tube BID Rahul P Desai, PA-C   100 mg at 05/20/15 1017  . levothyroxine (SYNTHROID, LEVOTHROID) tablet 150 mcg  150 mcg Oral QAC breakfast Hessie Knows, RPH   150 mcg at 05/20/15 0815  . magnesium hydroxide (MILK OF MAGNESIA) suspension 30 mL  30 mL Oral Daily PRN Dorothea Ogle, MD      . nicotine (NICODERM CQ - dosed in mg/24 hours) patch 21 mg  21 mg Transdermal Daily Dorothea Ogle, MD   21 mg at 05/20/15 1019  . ondansetron (ZOFRAN) tablet 4 mg  4 mg Oral Q6H PRN Dorothea Ogle, MD       Or  . ondansetron [facility]) injection 4 mg  4 mg Intravenous Q6H PRN Dorothea Ogle, MD      . pantoprazole (PROTONIX) EC tablet 40 mg  40 mg Oral Daily Hessie Knows, RPH   40 mg at 05/20/15 1017  . RESOURCE THICKENUP CLEAR   Oral PRN Kalman Shan, MD      . sodium chloride 0.9 % injection 3 mL  3 mL Intravenous Q12H Dorothea Ogle, MD   3 mL at 05/20/15 1018     Discharge Medications: Please see discharge summary for a list of discharge medications.  Relevant Imaging Results:  Relevant Lab Results:  Recent Labs    Additional Information SS # [id number]  Aisia Correira, Dickey Gave, LCSW

## 2015-05-20 NOTE — Progress Notes (Signed)
RT with the help of the RN placed patient on BIPAP via black box. Patient is tolerating well at this time. RT will continue to monitor.

## 2015-05-20 NOTE — Progress Notes (Addendum)
[facility] PROGRESS NOTE  [name] JXB:[id number] DOB: 1951/02/24 DOA: 05/13/2015 PCP: Margit Hanks, MD   Brief narrative 64 year old morbidly obese female (BMI of 72), history of mild mental retardation and schizoaffective disorder,? Multiple personality, OHS, bed bound, nursing home resident   who was admitted to try hospitalists for breast cellulitis but subsequently developed encephalopathy possibly secondary to hypercapnia and CO2 narcosis requiring transfer to ICU and was intubated. Patient also has UTI and was hypotensive requiring pressors. She was extubated and transferred to hospitalist service on 11/28. Patient now requiring when necessary BiPAP/CPAP for hypercapnia. Also on Lasix for fluid retention and anasarca.  Assessment/Plan: Principal problem Septic shock Secondary to cellulitis of right breast and UTI. Required vasopressor and steroid and ICU monitoring on ventilator. Now resolved. We'll discontinue hydrocortisone. Continue Rocephin.  Acute on chronic hypercapnic respiratory failure Has OSA with morbid obesity (>400 pounds and BMI of 72). Patient developed ventilator dependent respiratory failure on admission with acute metabolic encephalopathy. Now extubated and stable. On BiPAP/CPAP when necessary.  Escherichia coli UTI Treating with IV Rocephin.  Cellulitis of right breast Treated initially with clindamycin and was traced to vancomycin with Zosyn while in the ICU. Now on Rocephin. Improved.  Anasarca Significant increase in weight with fluid overload (393 Lb on admission to 415 pounds). Placed on scheduled Lasix. Has good urine output.  Acute metabolic encephalopathy Second 8 to sepsis and hypercapnic respiratory failure. Improved.  Schizoaffective disorder, bipolar type Home medications resumed. 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  A KI Patient's Lasix and losartan were held on admission due to severe sepsis and now developed  anasarca. Renal function has normalized and patient placed back on IV Lasix.  Atrial fibrillation Rate controlled. On aspirin.CAHDSvasc  Score of 3. When the discussion on anticoagulation. Next line hypothyroidism continue Synthroid  Essential hypertension Stable.  Hyponatremia Possibly due to dehydration and sepsis on admission. Resolved.  Friction dermatitis involving back and buttocks Appreciate wound care evaluation. Needs frequent repositioning and dressing (requires 4-5 people to do that every time)  DVT prophylaxis: Subcutaneous Lovenox  Diet: Dysphagia level I  Code Status:full  Family Communication: None at bedside Disposition Plan: Transfer to medical floor. Skilled nursing facility possibly tomorrow   Consultants:  Pulmonary  Procedures:  Intubation  central line placement  Antibiotics: IV clindamycin 11/23=27  IV Rocephin 11/27--  HPI/Subjective: Seen and examined. Tolerated BiPAP during the night. Denies any symptoms.  Objective: Filed Vitals:   05/20/15 0600 05/20/15 0800  BP: 152/84 131/76  Pulse: 85 82  Temp:  98.4 F (36.9 C)  Resp: 15 22    Intake/Output Summary (Last 24 hours) at 05/20/15 1103 Last data filed at 05/20/15 0900  Gross per 24 hour  Intake    270 ml  Output   3455 ml  Net  -3185 ml   Filed Weights   05/18/15 0500 05/19/15 0500 05/20/15 0500  Weight: 189.604 kg (418 lb) 188.243 kg (415 lb) 185.521 kg (409 lb)    Exam:   General:  Elderly morbidly obese female not in distress  HEENT: No pallor, moist oral mucosa, supple neck, left central line  Chest: Diminished breath sounds Bilaterally due to body habitus,  Cardiovascular: Normal S1 and S2, no murmurs  GI: Soft, nondistended, nontender, obese, foley +  Musculoskeletal: Warm, trace edema bilaterally  [place]: Alert and oriented, answering questions, Has multiple personality symptoms  Data Reviewed: Basic Metabolic Panel:  Recent Labs Lab 05/15/15 0502   05/16/15 0528 05/17/15 0625 05/18/15  5621 05/19/15 0512 05/20/15 0520  NA 137  < > 138 139 137 141 140  K 4.2  < > 4.2 4.5 4.9 5.4* 4.1  CL 101  < > 103 104 103 105 102  CO2 27  < > 32 34*  GLUCOSE 100*  < > 100* 103* 118* 99 95  BUN 40*  < > 24* CREATININE 1.17*  < > 0.95 0.80 0.75 0.86 0.98  CALCIUM 9.0  < > 8.9 9.2 9.1 9.7 9.4  MG 1.6*  --  2.1 1.9 1.9 2.4  --   PHOS 1.6*  --  2.5 3.5 3.1 2.9  --   < > = values in this interval not displayed. Liver Function Tests:  Recent Labs Lab 05/14/15 0405  AST 19  ALT 15  ALKPHOS 56  BILITOT 0.6  PROT 5.5*  ALBUMIN 2.7*   No results for input(s): LIPASE, AMYLASE in the last 168 hours. No results for input(s): AMMONIA in the last 168 hours. CBC:  Recent Labs Lab 05/13/15 1257  05/16/15 0528 05/17/15 0625 05/18/15 0315 05/19/15 0512 05/20/15 0520  WBC 6.6  < > 7.5 7.3 5.2 5.5 5.1  NEUTROABS 3.7  --  3.9 3.4 4.2 3.6  --   HGB 11.2*  < > 10.3* 10.5* 10.2* 10.6* 9.9*  HCT 37.0  < > 33.6* 36.3 34.4* 36.8 32.8*  MCV 98.9  < > 96.6 100.6* 101.2* 102.8* 99.7  PLT 249  < > 219 194 197 248 220  < > = values in this interval not displayed. Cardiac Enzymes:  Recent Labs Lab 05/14/15 0830 05/17/15 1245 05/17/15 2055 05/18/15 0315  TROPONINI <0.03 <0.03 <0.03 <0.03   BNP (last 3 results) No results for input(s): BNP in the last 8760 hours.  ProBNP (last 3 results) No results for input(s): PROBNP in the last 8760 hours.  CBG:  Recent Labs Lab 05/18/15 1151 05/18/15 1623 05/18/15 2005 05/18/15 2353 05/19/15 0404  GLUCAP 130* 106* 109* 123* 89    Recent Results (from the past 240 hour(s))  Culture, blood (routine x 2)     Status: None   Collection Time: 05/13/15  1:05 PM  Result Value Ref Range Status   Specimen Description BLOOD RIGHT ANTECUBITAL  Final   Special Requests BOTTLES DRAWN AEROBIC ONLY 5CC  Final   Culture  Setup Time   Final    GRAM POSITIVE COCCI IN CLUSTERS AEROBIC  BOTTLE ONLY CRITICAL RESULT CALLED TO, READ BACK BY AND VERIFIED WITH: R JOHNSON 05/14/15 @ 1157 M VESTAL    Culture   Final    STAPHYLOCOCCUS SPECIES (COAGULASE NEGATIVE) THE SIGNIFICANCE OF ISOLATING THIS ORGANISM FROM A SINGLE VENIPUNCTURE CANNOT BE PREDICTED WITHOUT FURTHER CLINICAL AND CULTURE CORRELATION. SUSCEPTIBILITIES AVAILABLE ONLY ON REQUEST. Performed at [facility]    Report Status 05/15/2015 FINAL  Final  Culture, blood (routine x 2)     Status: None   Collection Time: 05/13/15  1:10 PM  Result Value Ref Range Status   Specimen Description BLOOD LEFT ARM  Final   Special Requests BOTTLES DRAWN AEROBIC AND ANAEROBIC  Final   Culture   Final    NO GROWTH 5 DAYS Performed at [facility]    Report Status 05/18/2015 FINAL  Final  MRSA PCR Screening     Status: Abnormal   Collection Time: 05/13/15  7:00 PM  Result Value Ref Range Status   MRSA by PCR INVALID RESULTS,  SPECIMEN SENT FOR CULTURE (A) NEGATIVE Final    Comment: RESULT CALLED TO, READ BACK BY AND VERIFIED WITH: Sheppard Penton [id number] @ 2123 BY J SCOTTON        The GeneXpert MRSA Assay (FDA approved for NASAL specimens only), is one component of a comprehensive MRSA colonization surveillance program. It is not intended to diagnose MRSA infection nor to guide or monitor treatment for MRSA infections.   MRSA culture     Status: None   Collection Time: 05/13/15  7:00 PM  Result Value Ref Range Status   Specimen Description NOSE  Final   Special Requests NONE  Final   Culture NOMRSA Performed at [facility]   Final   Report Status 05/15/2015 FINAL  Final  Urine culture     Status: None   Collection Time: 05/13/15  9:39 PM  Result Value Ref Range Status   Specimen Description URINE, CATHETERIZED  Final   Special Requests NONE  Final   Culture   Final    >=100,000 COLONIES/mL ESCHERICHIA COLI Performed at [facility]    Report Status 05/16/2015 FINAL  Final    Organism ID, Bacteria ESCHERICHIA COLI  Final      Susceptibility   Escherichia coli - MIC*    AMPICILLIN 4 SENSITIVE Sensitive     CEFAZOLIN <=4 SENSITIVE Sensitive     CEFTRIAXONE <=1 SENSITIVE Sensitive     CIPROFLOXACIN >=4 RESISTANT Resistant     GENTAMICIN <=1 SENSITIVE Sensitive     IMIPENEM <=0.25 SENSITIVE Sensitive     NITROFURANTOIN <=16 SENSITIVE Sensitive     TRIMETH/SULFA >=320 RESISTANT Resistant     AMPICILLIN/SULBACTAM <=2 SENSITIVE Sensitive     PIP/TAZO <=4 SENSITIVE Sensitive     * >=100,000 COLONIES/mL ESCHERICHIA COLI     Studies: No results found.  Scheduled Meds: . antiseptic oral rinse  7 mL Mouth Rinse QID  . aspirin  81 mg Per Tube Daily  . cefTRIAXone (ROCEPHIN)  IV  2 g Intravenous Q24H  . chlorhexidine gluconate  15 mL Mouth Rinse BID  . collagenase   Topical Daily  . divalproex  1,250 mg Oral QHS  . enoxaparin (LOVENOX) injection  90 mg Subcutaneous Q24H  . feeding supplement (PRO-STAT SUGAR FREE 64)  30 mL Oral TID  . furosemide  60 mg Intravenous BID  . lamoTRIgine  100 mg Per Tube BID  . levothyroxine  150 mcg Oral QAC breakfast  . nicotine  21 mg Transdermal Daily  . pantoprazole  40 mg Oral Daily  . sodium chloride  3 mL Intravenous Q12H   Continuous Infusions: . sodium chloride 10 mL/hr at 05/20/15 0900      Time spent: 25 minutes    Katharin Schneider  [facility] Pager [phone] If 7PM-7AM, please contact night-coverage at [email or web address], password [facility] 05/20/2015, 11:03 AM  LOS: 6 days

## 2015-05-20 NOTE — Progress Notes (Signed)
Speech Language Pathology Treatment: Dysphagia  Patient Details Name: [name] MRN: [id number] DOB: 07/14/1950 Today's Date: 05/20/2015 Time: 1210-1220 SLP Time Calculation (min) (ACUTE ONLY): 10 min  Assessment / Plan / Recommendation Clinical Impression  Pt "talking" on her call bell upon SlP entrance to room.  She was able to be redirected to task at hand with min assistance when SLP offered coffee.  Pt took a single small bolus of thin via straw with timely swallow and clear voice.  She refused to take thickened liquid stating "I'm not going to drink that".  She also refused solids  - cracker- stating "I want food" despite max cues to try solids to allow evaluation for advancement readiness.  Pt then slammed call bell down on bed and stated "Leave me alone".  RN reports pt tolerating current diet.  SLP recommends advance to allow thin liquids.   Given pt edentulous *and uncertain if has dentures*  As well as tardive dyskinesia impacting oral function- and her refusal to try solids - puree diet currently recommended.  Follow up at SNF recommended to allow po diet advancement with medical improvement.     HPI HPI: [name] is a 64 y.o. female with a PMH including schizoaffective disorder, tardive dyskinesia, GERD, who resides at [facility]. She presented to [facility] ED 11/23 due to right breast area pain and swelling.  She was admitted for AMS / acute encephalopathy of unclear etiology but possibly due to schizoaffective disorder, bipolar disorder, AKI with hyperkalemia, dehydration, cellulitis of right breast. Later that evening, she became hypoxic and later more altered, she was placed on BiPAP. She ultimately failed BiPAP and was intubated on 05/14/15 until 05/16/15. Was previously seen by SLP in 5/15. Report states pt was consuming a regualr diet and thin liquids prior to that admission, but consume puree and nectar due to significant oral dysphagia, worsened by mental status change.        SLP Plan        Recommendations  Diet recommendations: Dysphagia 1 (puree);Thin liquid Liquids provided via: Cup;Straw Medication Administration:  (as tolerated) Supervision: Patient able to self feed;Intermittent supervision to cue for compensatory strategies Compensations: Slow rate;Small sips/bites Postural Changes and/or Swallow Maneuvers: Seated upright 90 degrees              Oral Care Recommendations: Oral care BID Follow up Recommendations: Skilled Nursing facility   Jennifer Koller, MS [facility] SLP [phone]

## 2015-05-20 NOTE — Clinical Social Work Note (Signed)
Clinical Social Work Assessment  Patient Details  Name: [name] MRN: [id number] Date of Birth: [birth year]  Date of referral:  05/20/15               Reason for consult:  Discharge Planning, Facility Placement                Permission sought to share information with:  Facility Art therapist granted to share information::  Yes, Verbal Permission Granted  Name::        Agency::     Relationship::     Contact Information:     Housing/Transportation Living arrangements for the past 2 months:  [place] of Information:  Patient, Facility Patient Interpreter Needed:  None Criminal Activity/Legal Involvement Pertinent to Current Situation/Hospitalization:  No - Comment as needed Significant Relationships:  Friend Lives with:  Facility Resident Do you feel safe going back to the place where you live?  Yes Need for family participation in patient care:  No (Coment)  Care giving concerns:  No care giving concerns reported.   Social Worker assessment / plan:  Pt is a LTC resident from [place] admitted to [facility] on 05/13/15 with acute encephalopathy. Pt met with pt and contacted SNF to assist with d/c planning. Pt plans to return to SNF at d/c. SNF contacted and has confirmed pt can return when stable. CSW will continue to follow to assist with d/c planning to SNF.  Employment status:  Disabled (Comment on whether or not currently receiving Disability) Insurance information:    PT Recommendations:  [place] / Referral to community resources:     Patient/Family's Response to care:  DON at [facility] is listed as contact for pt. No family involvement. DON is looking forward to pt's return.  Patient/Family's Understanding of and Emotional Response to Diagnosis, Current Treatment, and Prognosis:  Pt is alert, oriented to person / place. She is not aware of her medical status. Clinical info has been sent  to SNF for review. Pt is requesting to return to SNF. " I want to go back to the NH today. " Support & reassurance provided.  Emotional Assessment Appearance:  Appears stated age Attitude/Demeanor/Rapport:  Other (cooperative) Affect (typically observed):  Pleasant, Calm Orientation:  Oriented to Self, Oriented to Place Alcohol / Substance use:  Not Applicable Psych involvement (Current and /or in the community):  No (Comment)  Discharge Needs  Concerns to be addressed:    Readmission within the last 30 days:  No Current discharge risk:  None Barriers to Discharge:  No Barriers Identified   Luretha Rued, [place] 05/20/2015, 11:32 AM

## 2015-05-21 DIAGNOSIS — I509 Heart failure, unspecified: Secondary | ICD-10-CM

## 2015-05-21 DIAGNOSIS — J9612 Chronic respiratory failure with hypercapnia: Secondary | ICD-10-CM

## 2015-05-21 DIAGNOSIS — E871 Hypo-osmolality and hyponatremia: Secondary | ICD-10-CM

## 2015-05-21 DIAGNOSIS — E8770 Fluid overload, unspecified: Secondary | ICD-10-CM | POA: Diagnosis present

## 2015-05-21 DIAGNOSIS — A4151 Sepsis due to Escherichia coli [E. coli]: Principal | ICD-10-CM

## 2015-05-21 DIAGNOSIS — L8990 Pressure ulcer of unspecified site, unspecified stage: Secondary | ICD-10-CM

## 2015-05-21 DIAGNOSIS — E875 Hyperkalemia: Secondary | ICD-10-CM

## 2015-05-21 DIAGNOSIS — F250 Schizoaffective disorder, bipolar type: Secondary | ICD-10-CM

## 2015-05-21 LAB — BASIC METABOLIC PANEL
Anion gap: 5 (ref 5–15)
BUN: 14 mg/dL (ref 6–20)
CALCIUM: 9.6 mg/dL (ref 8.9–10.3)
CO2: 39 mmol/L — AB (ref 22–32)
CREATININE: 0.9 mg/dL (ref 0.44–1.00)
Chloride: 101 mmol/L (ref 101–111)
GLUCOSE: 84 mg/dL (ref 65–99)
Potassium: 3.4 mmol/L — ABNORMAL LOW (ref 3.5–5.1)
Sodium: 145 mmol/L (ref 135–145)

## 2015-05-21 MED ORDER — NICOTINE 21 MG/24HR TD PT24: 21.0000 mg | MEDICATED_PATCH | Freq: Every day | TRANSDERMAL | Status: DC

## 2015-05-21 MED ORDER — LORAZEPAM 0.5 MG PO TABS: ORAL_TABLET | ORAL | Status: DC

## 2015-05-21 MED ORDER — LORAZEPAM 0.5 MG PO TABS: ORAL_TABLET | ORAL | Status: AC

## 2015-05-21 MED ORDER — PRO-STAT SUGAR FREE PO LIQD: 30.0000 mL | Freq: Three times a day (TID) | ORAL | Status: DC

## 2015-05-21 MED ORDER — CHLORHEXIDINE GLUCONATE 0.12% ORAL RINSE (MEDLINE KIT): 15.0000 mL | Freq: Two times a day (BID) | OROMUCOSAL | Status: DC

## 2015-05-21 MED ORDER — POTASSIUM CHLORIDE CRYS ER 20 MEQ PO TBCR
40.0000 meq | EXTENDED_RELEASE_TABLET | Freq: Once | ORAL | Status: AC
  Administered 2015-05-21: 40 meq via ORAL
  Filled 2015-05-21: qty 2

## 2015-05-21 MED ORDER — CHLORHEXIDINE GLUCONATE 0.12 % MT SOLN
15.0000 mL | Freq: Two times a day (BID) | OROMUCOSAL | Status: DC
  Administered 2015-05-21: 15 mL via OROMUCOSAL
  Filled 2015-05-21 (×2): qty 15

## 2015-05-21 MED ORDER — SODIUM CHLORIDE 0.9 % IJ SOLN
10.0000 mL | INTRAMUSCULAR | Status: DC | PRN
  Administered 2015-05-21: 30 mL

## 2015-05-21 MED ORDER — CEPHALEXIN 500 MG PO CAPS: 500.0000 mg | ORAL_CAPSULE | Freq: Four times a day (QID) | ORAL | Status: AC

## 2015-05-21 MED ORDER — COLLAGENASE 250 UNIT/GM EX OINT: TOPICAL_OINTMENT | Freq: Every day | CUTANEOUS | Status: AC

## 2015-05-21 MED ORDER — FUROSEMIDE 20 MG PO TABS: 40.0000 mg | ORAL_TABLET | Freq: Two times a day (BID) | ORAL | Status: AC

## 2015-05-21 NOTE — Discharge Summary (Signed)
Physician Discharge Summary  [name] TFT:[id number] DOB: 1951-02-22 DOA: 05/13/2015  PCP: Margit Hanks, MD  Admit date: 05/13/2015 Discharge date: 05/21/2015  Time spent: 35 minutes  Recommendations for Outpatient Follow-up:  1. Discharged to skilled nursing facility. Patient will complete a course of antibiotic on 12/6. 2. Please have repeat swallow evaluation done at the facility. 3. Please continue nighttime CPAP to avoid hypercapnic respiratory failure/CO2 narcosis. 4. Please monitor daily weight and adjust Lasix dose accordingly.   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 coli) (HCC)   Discharge Condition: Fair  Diet recommendation: Dysphagia level I  CODE STATUS: Full code  Filed Weights   05/18/15 0500 05/19/15 0500 05/20/15 0500  Weight: 189.604 kg (418 lb) 188.243 kg (415 lb) 185.521 kg (409 lb)    History of present illness:  Please refer to admission H&P for details, in brief,64 year old morbidly obese female (BMI of 72), history of mild mental retardation and schizoaffective disorder,? Multiple personality, OHS, bed bound, nursing home resident who was admitted to try hospitalists for breast cellulitis but subsequently developed encephalopathy possibly secondary to hypercapnia and CO2 narcosis  requiring transfer to ICU and was intubated. Patient also has UTI and was hypotensive requiring pressors. She was extubated and transferred to hospitalist service on 11/28. Patient now requiring when necessary BiPAP/CPAP for hypercapnia. Also on Lasix for fluid retention and anasarca.  Hospital Course:  Principal problem Septic shock Secondary to cellulitis of right breast and UTI. Required vasopressor and steroid and ICU monitoring on ventilator. Now resolved. Discontinued hydrocortisone. Antibiotics is to Rocephin. Patient will be discharged on Keflex to complete a total 2 weeks of antibiotics..  Acute on chronic hypercapnic respiratory failure Has OSA with morbid obesity (>400 pounds and BMI of 72). Patient developed ventilator dependent respiratory failure on admission with acute metabolic encephalopathy. Now extubated and stable. Needs daily CPAP at bedtime.  Escherichia coli UTI Treating with IV Rocephin. Culture sensitive to cephalosporin. Will discharge her oral Keflex to complete 2 weeks course of antibiotics.  Cellulitis of right breast Treated initially with clindamycin and was traced to vancomycin with Zosyn while in the ICU. Then transitioned to Rocephin. Improved.  Anasarca Significant increase in weight with fluid overload (393 Lb on admission to 415 pounds). Placed on scheduled IV Lasix. Has good urine output. She still has net positive fluid balance (>1L). Not in distress. I have increased her Lasix to 40 mg by mouth twice daily. Monitor daily weight.  Acute metabolic encephalopathy Second 8 to sepsis and hypercapnic respiratory failure. Improved.  Schizoaffective disorder, bipolar type Home medications resumed. 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  A KI Patient's Lasix and losartan were held on admission due to severe sepsis and now developed anasarca. Renal function has normalized and patient placed back on both the medications.  Atrial fibrillation Rate  controlled. On aspirin.CAHDSvasc Score of 3. Needs  discussion on anticoagulation as outpt.   hypothyroidism  continue Synthroid  Essential hypertension Stable.  Hyponatremia Possibly due to dehydration and sepsis on admission. Resolved.  Friction dermatitis involving back and buttocks Appreciate wound care evaluation. Needs frequent repositioning and dressing (requires 4-5 people to do that every time)  Protein calorie malnutrition Added supplement.   Diet: Dysphagia level I   Family Communication: None at bedside Disposition Plan: Discharge back to skilled nursing facility  Consultants:  Pulmonary  Procedures:  Intubation  central line placement  Antibiotics: IV clindamycin 11/23-27 IV Rocephin 11/27--12/1 Oral Keflex 12/1-12/6   Discharge Exam: Filed Vitals:   05/20/15 2323 05/21/15 0518  BP:  142/74  Pulse:  90  Temp:  98 F (36.7 C)  Resp: 20 20     General: Elderly morbidly obese female not in distress  HEENT: No pallor, moist oral mucosa, supple neck, left central line  Chest: Diminished breath sounds Bilaterally due to body habitus,  Cardiovascular: Normal S1 and S2, no murmurs  GI: Soft, nondistended, nontender, obese, foley +  Musculoskeletal: Warm, trace edema bilaterally  Cns: Alert and oriented, answering questions appropriately  Discharge Instructions    Current Discharge Medication List    START taking these medications   Details  Amino Acids-Protein Hydrolys (FEEDING SUPPLEMENT, PRO-STAT SUGAR FREE 64,) LIQD Take 30 mLs by mouth 3 (three) times daily. Qty: 900 mL, Refills: 0    cephALEXin (KEFLEX) 500 MG capsule Take 1 capsule (500 mg total) by mouth 4 (four) times daily. Qty: 24 capsule, Refills: 0    collagenase (SANTYL) ointment Apply topically daily. Apply to necrotic ulcer on b/l flanks and right shoulder Qty: 15 g, Refills: 0    nicotine (NICODERM CQ - DOSED IN MG/24 HOURS) 21 mg/24hr patch Place 1 patch (21 mg  total) onto the skin daily. Qty: 28 patch, Refills: 0      CONTINUE these medications which have CHANGED   Details  furosemide (LASIX) 20 MG tablet Take 2 tablets (40 mg total) by mouth 2 (two) times daily. Qty: 30 tablet, Refills: 3   Associated Diagnoses: CHF with unknown LVEF (HCC)    LORazepam (ATIVAN) 0.5 MG tablet Take one tablet by mouth three times daily as needed for anxiety Qty: 20 tablet, Refills: 0      CONTINUE these medications which have NOT CHANGED   Details  aspirin 81 MG chewable tablet Chew 81 mg by mouth daily.    bisacodyl (DULCOLAX) 10 MG suppository Place 10 mg rectally every 3 (three) days as needed for mild constipation or moderate constipation.    chlorproMAZINE (THORAZINE) 50 MG tablet Take 50-100 mg by mouth. Take 50 mg twice daily (at 1200 and 1800) and take 100 mg at 2300    diphenhydrAMINE (BENADRYL) 25 MG tablet Take 25 mg by mouth every 8 (eight) hours as needed for itching.    divalproex (DEPAKOTE ER) 250 MG 24 hr tablet Take 1,250 mg by mouth at bedtime.    lamoTRIgine (LAMICTAL) 100 MG tablet Take 100 mg by mouth 2 (two) times daily. For mood disorder    levothyroxine (SYNTHROID, LEVOTHROID) 150 MCG tablet Take 150 mcg by mouth every morning. For thyroid disease.    lisinopril (PRINIVIL,ZESTRIL) 5 MG tablet Take 1 tablet (5 mg total) by mouth daily. Qty: 90 tablet, Refills: 3   Associated Diagnoses: Benign hypertensive heart disease without heart failure    magnesium hydroxide (MILK OF MAGNESIA) 400 MG/5ML suspension Take  30 mLs by mouth daily as needed for mild constipation.    omeprazole (PRILOSEC) 40 MG capsule Take 40 mg by mouth daily.    PRESCRIPTION MEDICATION Apply 1 application topically. Compounded ativan 0.5mg  gel.  Apply 1mg /33ml twice daily as needed for anxiety      STOP taking these medications     doxycycline (VIBRAMYCIN) 100 MG capsule        Allergies  Allergen Reactions  . Food Rash    "lasagna"= rash from the  noodles  . Penicillins Hives    Per MAR- unable to ask quantification questions Can tolerate cephalosporins  . Sulfamethoxazole-Trimethoprim Hives and Nausea And Vomiting   Follow-up Information    Follow up with [facility].   Specialty:  Skilled Nursing Facility   Why:  to be seen by MD in 1 week   Contact information:   [address] [place] [address] [phone]       The results of significant diagnostics from this hospitalization (including imaging, microbiology, ancillary and laboratory) are listed below for reference.    Significant Diagnostic Studies: Dg Chest Port 1 View  05/17/2015  CLINICAL DATA:  Endotracheal tube.  COPD.  Asthma EXAM: PORTABLE CHEST 1 VIEW COMPARISON:  05/16/2015 FINDINGS: Interval extubation and removal of NG tube. Stable enlarged cardiac silhouette. There is mild increase in fine airspace disease in the upper lobes. Low lung volumes. No increase in atelectasis. LEFT central venous line unchanged. IMPRESSION: 1. Stable lung volumes following extubation. 2. Slight increase in upper lobe airspace disease suggests mild pulmonary edema. Electronically Signed   By: Genevive Bi M.D.   On: 05/17/2015 07:13   Dg Chest Port 1 View  05/16/2015  CLINICAL DATA:  64 year old female currently intubated EXAM: PORTABLE CHEST 1 VIEW COMPARISON:  Prior chest x-ray 05/15/2015 FINDINGS: The tip of the endotracheal tube is 2.4 cm above the carina. A nasogastric tube is present and is coiled within the gastric body. Left subclavian approach central venous catheter remains in unchanged position with the tip overlying the superior cavoatrial junction. Persistent low inspiratory volumes with bibasilar atelectasis versus infiltrate. Stable mild cardiomegaly. Slight blurring of the interstitial markings likely secondary to patient motion. Overall, no significant interval change in the appearance of the chest. No acute osseous  abnormality. IMPRESSION: 1. The tip the endotracheal tube is 2.4 cm above the carina. Other support apparatus also in satisfactory and unchanged position. 2. No significant interval change in the appearance of the lungs with persistent low inspiratory volumes and bibasilar atelectasis versus infiltrate. 3. Unchanged borderline cardiomegaly. Electronically Signed   By: Malachy Moan M.D.   On: 05/16/2015 09:29   Portable Chest Xray  05/15/2015  CLINICAL DATA:  Intubation. EXAM: PORTABLE CHEST 1 VIEW COMPARISON:  05/14/2015. FINDINGS: Endotracheal tube 3 cm above the carina. Left subclavian central line stable position. NG tube in stable position. Mediastinum and hilar structures are unremarkable. Cardiomegaly with normal pulmonary vascularity. Bibasilar subsegmental atelectasis and/or infiltrates. Small right pleural effusion cannot be excluded. No pneumothorax . IMPRESSION: 1. Endotracheal tube 3 cm above the carina. Remaining lines and tubes in stable position. 2. Bibasilar subsegmental atelectasis and/or mild infiltrates. 3. Stable cardiomegaly . Electronically Signed   By: Maisie Fus  Register   On: 05/15/2015 07:06   Dg Chest Port 1 View  05/14/2015  CLINICAL DATA:  Central line and orogastric tube placement. Altered mental status. Initial encounter. EXAM: PORTABLE CHEST 1 VIEW COMPARISON:  Chest radiograph performed 05/13/2015 FINDINGS:  The patient's endotracheal tube is seen ending just above the carina. This should be retracted 2-3 cm. An enteric tube is noted extending below the diaphragm. Patchy bilateral airspace opacification may reflect atelectasis. No definite pleural effusion or pneumothorax is seen. The cardiomediastinal silhouette is enlarged. No acute osseous abnormalities are identified. IMPRESSION: 1. Endotracheal tube seen ending just above the carina. This should be retracted 2-3 cm. 2. Patchy bilateral airspace opacification may reflect atelectasis. Cardiomegaly noted. Electronically  Signed   By: Roanna Raider M.D.   On: 05/14/2015 06:24   Dg Chest Port 1 View  05/13/2015  CLINICAL DATA:  Hypoxia. Diabetes. Shortness of breath. Former smoker for 14 years. EXAM: PORTABLE CHEST 1 VIEW COMPARISON:  05/23/2013 FINDINGS: Shallow inspiration. Cardiac enlargement without significant vascular congestion. Mild volume loss in the right upper lung is probably due to shallow inspiration. No focal consolidation or airspace disease is appreciated. No blunting of costophrenic angles. No pneumothorax. Mediastinal contours appear intact. IMPRESSION: Cardiac enlargement.  Shallow inspiration.  No focal consolidation. Electronically Signed   By: Burman Nieves M.D.   On: 05/13/2015 20:06    Microbiology: Recent Results (from the past 240 hour(s))  Culture, blood (routine x 2)     Status: None   Collection Time: 05/13/15  1:05 PM  Result Value Ref Range Status   Specimen Description BLOOD RIGHT ANTECUBITAL  Final   Special Requests BOTTLES DRAWN AEROBIC ONLY 5CC  Final   Culture  Setup Time   Final    GRAM POSITIVE COCCI IN CLUSTERS AEROBIC BOTTLE ONLY CRITICAL RESULT CALLED TO, READ BACK BY AND VERIFIED WITH: R JOHNSON 05/14/15 @ 1157 M VESTAL    Culture   Final    STAPHYLOCOCCUS SPECIES (COAGULASE NEGATIVE) THE SIGNIFICANCE OF ISOLATING THIS ORGANISM FROM A SINGLE VENIPUNCTURE CANNOT BE PREDICTED WITHOUT FURTHER CLINICAL AND CULTURE CORRELATION. SUSCEPTIBILITIES AVAILABLE ONLY ON REQUEST. Performed at [facility]    Report Status 05/15/2015 FINAL  Final  Culture, blood (routine x 2)     Status: None   Collection Time: 05/13/15  1:10 PM  Result Value Ref Range Status   Specimen Description BLOOD LEFT ARM  Final   Special Requests BOTTLES DRAWN AEROBIC AND ANAEROBIC  Final   Culture   Final    NO GROWTH 5 DAYS Performed at [facility]    Report Status 05/18/2015 FINAL  Final  MRSA PCR Screening     Status: Abnormal   Collection Time: 05/13/15  7:00 PM   Result Value Ref Range Status   MRSA by PCR INVALID RESULTS, SPECIMEN SENT FOR CULTURE (A) NEGATIVE Final    Comment: RESULT CALLED TO, READ BACK BY AND VERIFIED WITH: Sheppard Penton [id number] @ 2123 BY J SCOTTON        The GeneXpert MRSA Assay (FDA approved for NASAL specimens only), is one component of a comprehensive MRSA colonization surveillance program. It is not intended to diagnose MRSA infection nor to guide or monitor treatment for MRSA infections.   MRSA culture     Status: None   Collection Time: 05/13/15  7:00 PM  Result Value Ref Range Status   Specimen Description NOSE  Final   Special Requests NONE  Final   Culture NOMRSA Performed at [facility]   Final   Report Status 05/15/2015 FINAL  Final  Urine culture     Status: None   Collection Time: 05/13/15  9:39 PM  Result Value Ref Range Status   Specimen Description URINE,  CATHETERIZED  Final   Special Requests NONE  Final   Culture   Final    >=100,000 COLONIES/mL ESCHERICHIA COLI Performed at [facility]    Report Status 05/16/2015 FINAL  Final   Organism ID, Bacteria ESCHERICHIA COLI  Final      Susceptibility   Escherichia coli - MIC*    AMPICILLIN 4 SENSITIVE Sensitive     CEFAZOLIN <=4 SENSITIVE Sensitive     CEFTRIAXONE <=1 SENSITIVE Sensitive     CIPROFLOXACIN >=4 RESISTANT Resistant     GENTAMICIN <=1 SENSITIVE Sensitive     IMIPENEM <=0.25 SENSITIVE Sensitive     NITROFURANTOIN <=16 SENSITIVE Sensitive     TRIMETH/SULFA >=320 RESISTANT Resistant     AMPICILLIN/SULBACTAM <=2 SENSITIVE Sensitive     PIP/TAZO <=4 SENSITIVE Sensitive     * >=100,000 COLONIES/mL ESCHERICHIA COLI     Labs: Basic Metabolic Panel:  Recent Labs Lab 05/15/15 0502  05/16/15 0528 05/17/15 0625 05/18/15 0315 05/19/15 0512 05/20/15 0520 05/21/15 0440  NA 137  < > 138 139 137 141 140 145  K 4.2  < > 4.2 4.5 4.9 5.4* 4.1 3.4*  CL 101  < > 103 104 103 105 102 101  CO2 27  < > 28 31 31  32 34*  39*  GLUCOSE 100*  < > 100* 103* 118* 99 95 84  BUN 40*  < > 24* 15 13 14 16 14   CREATININE 1.17*  < > 0.95 0.80 0.75 0.86 0.98 0.90  CALCIUM 9.0  < > 8.9 9.2 9.1 9.7 9.4 9.6  MG 1.6*  --  2.1 1.9 1.9 2.4  --   --   PHOS 1.6*  --  2.5 3.5 3.1 2.9  --   --   < > = values in this interval not displayed. Liver Function Tests: No results for input(s): AST, ALT, ALKPHOS, BILITOT, PROT, ALBUMIN in the last 168 hours. No results for input(s): LIPASE, AMYLASE in the last 168 hours. No results for input(s): AMMONIA in the last 168 hours. CBC:  Recent Labs Lab 05/16/15 0528 05/17/15 0625 05/18/15 0315 05/19/15 0512 05/20/15 0520  WBC 7.5 7.3 5.2 5.5 5.1  NEUTROABS 3.9 3.4 4.2 3.6  --   HGB 10.3* 10.5* 10.2* 10.6* 9.9*  HCT 33.6* 36.3 34.4* 36.8 32.8*  MCV 96.6 100.6* 101.2* 102.8* 99.7  PLT 219 194 197 248 220   Cardiac Enzymes:  Recent Labs Lab 05/17/15 1245 05/17/15 2055 05/18/15 0315  TROPONINI <0.03 <0.03 <0.03   BNP: BNP (last 3 results) No results for input(s): BNP in the last 8760 hours.  ProBNP (last 3 results) No results for input(s): PROBNP in the last 8760 hours.  CBG:  Recent Labs Lab 05/18/15 1151 05/18/15 1623 05/18/15 2005 05/18/15 2353 05/19/15 0404  GLUCAP 130* 106* 109* 123* 89       Signed:  Terrea Bruster  [facility] 05/21/2015, 10:30 AM

## 2015-05-21 NOTE — Progress Notes (Signed)
Pt to be d/c today to [facility]  Pt and family agreeable. Confirmed plans with facility.  Plan transfer via EMS at 5pm.    Leron Croak LCSWA  [facility]

## 2015-05-21 NOTE — Progress Notes (Signed)
Left subclavian central line drsg due to be changed today however patient is adamantly refusing let me change her drsg and flush her lines.

## 2015-05-21 NOTE — Progress Notes (Signed)
Gave report to [place], Charity fundraiser at [employer] at [employer].  Left number if she had additional questions.

## 2015-05-21 NOTE — Care Management Important Message (Signed)
Important Message  Patient Details  Name: [name] MRN: [id number] Date of Birth: 06-18-1951   Medicare Important Message Given:  Yes    [name] 05/21/2015, 12:19 PMImportant Message  Patient Details  Name: [name] MRN: [id number] Date of Birth: [birth year]   Medicare Important Message Given:  Yes    [name] 05/21/2015, 12:19 PM

## 2015-05-21 NOTE — Progress Notes (Signed)
CSW spoke with the admissions coordinator and the Pt can return to facility today. CSW sent d/c summary and will follow for d/c planning.     [name] [facility]  [phone]

## 2015-05-25 ENCOUNTER — Non-Acute Institutional Stay (SKILLED_NURSING_FACILITY): Payer: Medicare Other | Admitting: Internal Medicine

## 2015-05-25 ENCOUNTER — Encounter: Payer: Self-pay | Admitting: Internal Medicine

## 2015-05-25 DIAGNOSIS — G9340 Encephalopathy, unspecified: Secondary | ICD-10-CM | POA: Diagnosis not present

## 2015-05-25 DIAGNOSIS — R601 Generalized edema: Secondary | ICD-10-CM | POA: Diagnosis not present

## 2015-05-25 DIAGNOSIS — I480 Paroxysmal atrial fibrillation: Secondary | ICD-10-CM

## 2015-05-25 DIAGNOSIS — A419 Sepsis, unspecified organism: Secondary | ICD-10-CM | POA: Diagnosis not present

## 2015-05-25 DIAGNOSIS — A4151 Sepsis due to Escherichia coli [E. coli]: Secondary | ICD-10-CM | POA: Diagnosis not present

## 2015-05-25 DIAGNOSIS — J9622 Acute and chronic respiratory failure with hypercapnia: Secondary | ICD-10-CM | POA: Diagnosis not present

## 2015-05-25 DIAGNOSIS — F250 Schizoaffective disorder, bipolar type: Secondary | ICD-10-CM

## 2015-05-25 DIAGNOSIS — B9620 Unspecified Escherichia coli [E. coli] as the cause of diseases classified elsewhere: Secondary | ICD-10-CM

## 2015-05-25 DIAGNOSIS — N179 Acute kidney failure, unspecified: Secondary | ICD-10-CM | POA: Diagnosis not present

## 2015-05-25 DIAGNOSIS — I110 Hypertensive heart disease with heart failure: Secondary | ICD-10-CM | POA: Diagnosis not present

## 2015-05-25 DIAGNOSIS — N390 Urinary tract infection, site not specified: Secondary | ICD-10-CM | POA: Diagnosis not present

## 2015-05-25 DIAGNOSIS — N61 Mastitis without abscess: Secondary | ICD-10-CM

## 2015-05-25 DIAGNOSIS — R6521 Severe sepsis with septic shock: Secondary | ICD-10-CM

## 2015-05-25 NOTE — Progress Notes (Addendum)
MRN: [id number] Name: [name]  Sex: female Age: 64 y.o. DOB: [birth year]  PSC #: Ronni Rumble Facility/Room:203 Level Of Care: SNF Provider: Merrilee Seashore D Emergency Contacts: Extended Emergency Contact Information Primary Emergency Contact: Maxwell,Clarance Address: [address]          [place], [place] [address] [place] Phone: [phone] Relation: Friend Secondary Emergency Contact: Virl Son, [place] Phone: [phone] Relation: Friend Guardian: Pritts,Patricia          [place] Home Phone: [phone] Relation: Sister  Code Status:   Allergies: Food; Penicillins; and Sulfamethoxazole-trimethoprim  Chief Complaint  Patient presents with  . Readmit To SNF    HPI: Patient is 64 y.o. female who is morbidly obese female (BMI of 72), history of mild mental retardation and schizoaffective disorder,? Multiple personality, OHS, bed bound, nursing home resident who was admitted to [facility] from 11/23-12/1 for breast cellulitis ,but subsequently developed encephalopathy possibly secondary to hypercapnia and CO2 narcosis,  required transfer to ICU and was intubated. Hospital course was further complicated by a  UTI with hypotension  requiring pressors. Patient now requiring prn BiPAP/CPAP for hypercapnia. Pt is admitted to SNF for generalized weakness and residential care for her multiple co-morbidities. While at SNF pt will be followed for PAF, tx with ASA, hypothyroidism, tx with synthroid and HTN, tx with lisinopril and lasix.  Past Medical History  Diagnosis Date  . Bipolar disorder (HCC)   . Schizoaffective disorder (HCC)   . Mood disorder (HCC)   . Epilepsy (HCC)   . Thyroid disease   . Hiatal hernia   . Atrial flutter (HCC)   . Schizoaffective disorder   . Urinary tract infection   . Hypertension   . GERD (gastroesophageal reflux disease)   .  Hypothyroidism   . Obesity   . Asthma   . COPD (chronic obstructive pulmonary disease) (HCC)   . CHF (congestive heart failure) (HCC)   . Seizures (HCC)   . Morbid obesity (HCC)   . Chronic pain   . Hypercholesteremia   . Anginal pain (HCC)   . Myocardial infarction (HCC) 2002  . Diabetes mellitus     "I'm not diabetic anymore" (05/23/2012)  . Anemia   . History of blood transfusion 1993    "when I had my hysterectomy" (05/23/2012)  . H/O hiatal hernia   . Daily headache   . Arthritis     "severe; all over my body" (05/23/2012)  . Hypoventilation syndrome     [name] 05/30/2012  . Atrial fibrillation (HCC)   . Atrial flutter, paroxysmal (HCC)     [name] 05/30/2012  . Exertional dyspnea   . Shortness of breath     "all the time lately" (05/30/2012)  . Pneumonia     "several times; including now" (04/23/2013)  . Anxiety   . Episodic mood disorder (HCC)   . Psychosis   . Schizophrenia (HCC)   . 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 CHOLELITHIASIS 11/30/2009    Qualifier: Diagnosis of  By: Brynda Rim      Past Surgical History  Procedure Laterality Date  . Tubal ligation  1998  . Cholecystectomy  ?2008  . Cardiac catheterization  2008  . Vaginal hysterectomy  1993      Medication List  This list is accurate as of: 05/25/15 11:59 PM.  Always use your most recent med list.               aspirin 81 MG chewable tablet  Chew 81 mg by mouth daily.     bisacodyl 10 MG suppository  Commonly known as:  DULCOLAX  Place 10 mg rectally every 3 (three) days as needed for mild constipation or moderate constipation.     cephALEXin 500 MG capsule  Commonly known as:  KEFLEX  Take 1 capsule (500 mg total) by mouth 4 (four) times daily.     chlorproMAZINE 50 MG tablet  Commonly known as:  THORAZINE  Take 50-100 mg by mouth. Take 50 mg twice daily (at 1200 and 1800) and take 100 mg at 2300     collagenase  ointment  Commonly known as:  SANTYL  Apply topically daily. Apply to necrotic ulcer on b/l flanks and right shoulder     diphenhydrAMINE 25 MG tablet  Commonly known as:  BENADRYL  Take 25 mg by mouth every 8 (eight) hours as needed for itching.     divalproex 250 MG 24 hr tablet  Commonly known as:  DEPAKOTE ER  Take 1,250 mg by mouth at bedtime.     feeding supplement (PRO-STAT SUGAR FREE 64) Liqd  Take 30 mLs by mouth 3 (three) times daily.     furosemide 20 MG tablet  Commonly known as:  LASIX  Take 2 tablets (40 mg total) by mouth 2 (two) times daily.     lamoTRIgine 100 MG tablet  Commonly known as:  LAMICTAL  Take 100 mg by mouth 2 (two) times daily. For mood disorder     levothyroxine 150 MCG tablet  Commonly known as:  SYNTHROID, LEVOTHROID  Take 150 mcg by mouth every morning. For thyroid disease.     lisinopril 5 MG tablet  Commonly known as:  PRINIVIL,ZESTRIL  Take 1 tablet (5 mg total) by mouth daily.     LORazepam 0.5 MG tablet  Commonly known as:  ATIVAN  Take one tablet by mouth three times daily as needed for anxiety     MILK OF MAGNESIA 400 MG/5ML suspension  Generic drug:  magnesium hydroxide  Take 30 mLs by mouth daily as needed for mild constipation.     nicotine 21 mg/24hr patch  Commonly known as:  NICODERM CQ - dosed in mg/24 hours  Place 1 patch (21 mg total) onto the skin daily.     omeprazole 40 MG capsule  Commonly known as:  PRILOSEC  Take 40 mg by mouth daily.     PRESCRIPTION MEDICATION  Apply 1 application topically. Compounded ativan 0.5mg  gel.  Apply 1mg /80ml twice daily as needed for anxiety        No orders of the defined types were placed in this encounter.    Immunization History  Administered Date(s) Administered  . Influenza Split 03/29/2012  . Influenza Whole 05/27/2009, 02/18/2010  . Influenza,inj,Quad PF,36+ Mos 04/24/2013  . Influenza-Unspecified 03/31/2014  . Pneumococcal Polysaccharide-23 05/20/2009,  03/29/2012  . Td 05/27/2009    Social History  Substance Use Topics  . Smoking status: Former Smoker -- 2.00 packs/day for 12 years    Types: Cigarettes    Quit date: 06/29/2000  . Smokeless tobacco: Current User    Types: Chew  . Alcohol Use: No    Family history is +CA, HD   Review of Systems - UTO from pt 2/2 baseline confusion; per  nursing no concerns    Filed Vitals:   06/05/15 2209  BP: 132/68  Pulse: 77  Temp: 98 F (36.7 C)  Resp: 19    SpO2 Readings from Last 1 Encounters:  06/04/15 100%        Physical Exam  GENERAL APPEARANCE: Alert, speaking on the bed controls like a telephone  No acute distress. Naked except for diaper very obese WF  SKIN: No diaphoresis rash, no cellulitis to breast HEAD: Normocephalic, atraumatic  EYES: Conjunctiva/lids clear. Pupils round, reactive. EOMs intact.  EARS: External exam WNL, canals clear. Hearing grossly normal.  NOSE: No deformity or discharge.  MOUTH/THROAT: Lips w/o lesions  RESPIRATORY: Breathing is even, unlabored. Lung sounds are clear   CARDIOVASCULAR: Heart RRR no murmurs, rubs or gallops. 1+ peripheral edema.   GASTROINTESTINAL: Abdomen is soft, non-tender, not distended w/ normal bowel sounds. GENITOURINARY: Bladder non tender, not distended  MUSCULOSKELETAL: No abnormal joints or musculature NEUROLOGIC:  Cranial nerves 2-12 grossly intact. Moves all extremities  PSYCHIATRIC: very odd and I witnessed 2 different personalities  Patient Active Problem List   Diagnosis Date Noted  . E. coli UTI 06/05/2015  . Anasarca 06/05/2015  . Acute on chronic respiratory failure with hypoxia and hypercapnia (HCC)   . Schizoaffective disorder (HCC) 05/29/2015  . Cellulitis and abscess of trunk 05/29/2015  . AKI (acute kidney injury) (HCC) 05/28/2015  . Sepsis (HCC) 05/28/2015  . Fluid overload 05/21/2015  . Sepsis due to Escherichia coli (E. coli) (HCC) 05/21/2015  . CHF with unknown LVEF (HCC)   . Pressure  ulcer 05/19/2015  . Pickwickian syndrome (HCC) 05/19/2015  . Acute and chronic respiratory failure with hypercapnia (HCC) 05/14/2015  . Septic shock (HCC) 05/14/2015  . Acute respiratory failure (HCC) 05/14/2015  . Cellulitis of right breast 05/13/2015  . Acute renal failure (HCC) 05/13/2015  . Hyperkalemia 05/13/2015  . Hyponatremia 05/13/2015  . Acute encephalopathy 05/13/2015  . Hypertensive heart disease with CHF (congestive heart failure) (HCC) 01/01/2015  . Schizoaffective disorder, bipolar type (HCC) 08/07/2014  . COPD (chronic obstructive pulmonary disease) (HCC) 04/19/2013  . Atrial fibrillation (HCC) 03/28/2012  . Hypothyroidism 03/28/2012  . Diabetes mellitus (HCC) 03/28/2012  . Morbid obesity (HCC)   . Obstructive sleep apnea 01/22/2010  . LOW BACK PAIN SYNDROME 12/11/2007  . GERD 02/06/2007  . TARDIVE DYSKINESIA 08/10/2004    CBC    Component Value Date/Time   WBC 4.3 06/01/2015 0256   WBC 5.7 01/30/2014   RBC 3.42* 06/01/2015 0256   RBC 2.70* 05/19/2011 0632   HGB 9.9* 06/01/2015 0256   HCT 35.3* 06/01/2015 0256   PLT 248 06/01/2015 0256   MCV 103.2* 06/01/2015 0256   LYMPHSABS 1.8 05/28/2015 1948   MONOABS 0.9 05/28/2015 1948   EOSABS 0.0 05/28/2015 1948   BASOSABS 0.0 05/28/2015 1948    CMP     Component Value Date/Time   NA 140 06/04/2015 0439   NA 141 01/30/2014   K 3.3* 06/04/2015 0439   CL 92* 06/04/2015 0439   CO2 38* 06/04/2015 0439   GLUCOSE 86 06/04/2015 0439   BUN 6 06/04/2015 0439   BUN 9 01/30/2014   CREATININE 0.89 06/04/2015 0439   CREATININE 0.5 01/30/2014   CALCIUM 9.3 06/04/2015 0439   PROT 6.2* 05/28/2015 1948   ALBUMIN 2.9* 05/28/2015 1948   AST 14* 05/28/2015 1948   ALT 9* 05/28/2015 1948   ALKPHOS 55 05/28/2015 1948   BILITOT 0.4 05/28/2015 1948   GFRNONAA >60 06/04/2015 8325  GFRAA >60 06/04/2015 0439    Lab Results  Component Value Date   HGBA1C 5.9* 05/14/2015     Dg Chest Port 1 View  05/14/2015  CLINICAL  DATA:  Central line and orogastric tube placement. Altered mental status. Initial encounter. EXAM: PORTABLE CHEST 1 VIEW COMPARISON:  Chest radiograph performed 05/13/2015 FINDINGS: The patient's endotracheal tube is seen ending just above the carina. This should be retracted 2-3 cm. An enteric tube is noted extending below the diaphragm. Patchy bilateral airspace opacification may reflect atelectasis. No definite pleural effusion or pneumothorax is seen. The cardiomediastinal silhouette is enlarged. No acute osseous abnormalities are identified. IMPRESSION: 1. Endotracheal tube seen ending just above the carina. This should be retracted 2-3 cm. 2. Patchy bilateral airspace opacification may reflect atelectasis. Cardiomegaly noted. Electronically Signed   By: Roanna Raider M.D.   On: 05/14/2015 06:24   Dg Chest Port 1 View  05/13/2015  CLINICAL DATA:  Hypoxia. Diabetes. Shortness of breath. Former smoker for 14 years. EXAM: PORTABLE CHEST 1 VIEW COMPARISON:  05/23/2013 FINDINGS: Shallow inspiration. Cardiac enlargement without significant vascular congestion. Mild volume loss in the right upper lung is probably due to shallow inspiration. No focal consolidation or airspace disease is appreciated. No blunting of costophrenic angles. No pneumothorax. Mediastinal contours appear intact. IMPRESSION: Cardiac enlargement.  Shallow inspiration.  No focal consolidation. Electronically Signed   By: Burman Nieves M.D.   On: 05/13/2015 20:06    Not all labs, radiology exams or other studies done during hospitalization come through on my EPIC note; however they are reviewed by me.    Assessment and Plan  Septic shock (HCC) Secondary to cellulitis of right breast and UTI. Required vasopressor and steroid and ICU monitoring on ventilator. Now resolved. Discontinued hydrocortisone. Antibiotics is to Rocephin.  SNF - discharged on Keflex to complete a total 2 weeks of antibiotics..   Acute and chronic  respiratory failure with hypercapnia (HCC) Has OSA with morbid obesity (>400 pounds and BMI of 72). Patient developed ventilator dependent respiratory failure on admission with acute metabolic encephalopathy. Now extubated and stable. SNF - cont CPAP at bedtime.   Sepsis due to Escherichia coli (E. coli) (HCC) Treating with IV Rocephin. Culture sensitive to cephalosporin. SNF -  oral Keflex to complete 2 weeks course of antibiotics.   E. coli UTI Treating with IV Rocephin. Culture sensitive to cephalosporin. SNF -  oral Keflex to complete 2 weeks course of antibiotics.   Cellulitis of right breast Treated initially with clindamycin and was traced to vancomycin with Zosyn while in the ICU. Then transitioned to Rocephin. Improved.   Anasarca Significant increase in weight with fluid overload (393 Lb on admission to 415 pounds). Placed on scheduled IV Lasix. Has good urine output. She still has net positive fluid balance (>1L). Not in distress. SNF - cont new dose of  Lasix to 40 mg by mouth twice daily. Monitor weight.   Acute encephalopathy Second 8 to sepsis and hypercapnic respiratory failure. Improved.  Schizoaffective disorder, bipolar type (HCC) SNF - cont - thorazine, lamictaldepakote and ativan  AKI (acute kidney injury) (HCC) Patient's Lasix and losartan were held on admission due to severe sepsis and now developed anasarca. Renal function has normalized and patient placed back on both the medications. SNF - losartan was not on d/c summary but lisinopril 5 mg is ; check BMP for renal fx  Atrial fibrillation (HCC) SNF - reported rate controlled; on ASA as prophylaxis  Hypertensive heart disease with CHF (congestive  heart failure) (HCC) SNF - controlled on lasix 40 mg BID and lisinopril 5 mg   Time spent > 45 min;> 50% of time with patient was spent reviewing records, labs, tests and studies, counseling and developing plan of care  Margit Hanks, MD

## 2015-05-28 ENCOUNTER — Emergency Department (HOSPITAL_COMMUNITY): Payer: Medicare Other

## 2015-05-28 ENCOUNTER — Encounter (HOSPITAL_COMMUNITY): Payer: Self-pay

## 2015-05-28 ENCOUNTER — Inpatient Hospital Stay (HOSPITAL_COMMUNITY)
Admission: EM | Admit: 2015-05-28 | Discharge: 2015-06-04 | DRG: 871 | Disposition: A | Discharge status: Skilled Nursing Facility | Payer: Medicare Other | Attending: Internal Medicine | Admitting: Internal Medicine

## 2015-05-28 DIAGNOSIS — E039 Hypothyroidism, unspecified: Secondary | ICD-10-CM | POA: Diagnosis present

## 2015-05-28 DIAGNOSIS — E119 Type 2 diabetes mellitus without complications: Secondary | ICD-10-CM | POA: Diagnosis present

## 2015-05-28 DIAGNOSIS — Z87891 Personal history of nicotine dependence: Secondary | ICD-10-CM | POA: Diagnosis not present

## 2015-05-28 DIAGNOSIS — M79606 Pain in leg, unspecified: Secondary | ICD-10-CM | POA: Diagnosis present

## 2015-05-28 DIAGNOSIS — K449 Diaphragmatic hernia without obstruction or gangrene: Secondary | ICD-10-CM | POA: Diagnosis present

## 2015-05-28 DIAGNOSIS — A419 Sepsis, unspecified organism: Principal | ICD-10-CM | POA: Diagnosis present

## 2015-05-28 DIAGNOSIS — Z7982 Long term (current) use of aspirin: Secondary | ICD-10-CM | POA: Diagnosis not present

## 2015-05-28 DIAGNOSIS — Z6841 Body Mass Index (BMI) 40.0 and over, adult: Secondary | ICD-10-CM | POA: Diagnosis not present

## 2015-05-28 DIAGNOSIS — G8929 Other chronic pain: Secondary | ICD-10-CM | POA: Diagnosis present

## 2015-05-28 DIAGNOSIS — L89102 Pressure ulcer of unspecified part of back, stage 2: Secondary | ICD-10-CM | POA: Diagnosis present

## 2015-05-28 DIAGNOSIS — D649 Anemia, unspecified: Secondary | ICD-10-CM | POA: Diagnosis present

## 2015-05-28 DIAGNOSIS — Z90710 Acquired absence of both cervix and uterus: Secondary | ICD-10-CM

## 2015-05-28 DIAGNOSIS — F258 Other schizoaffective disorders: Secondary | ICD-10-CM | POA: Diagnosis not present

## 2015-05-28 DIAGNOSIS — I10 Essential (primary) hypertension: Secondary | ICD-10-CM | POA: Diagnosis present

## 2015-05-28 DIAGNOSIS — J449 Chronic obstructive pulmonary disease, unspecified: Secondary | ICD-10-CM | POA: Diagnosis present

## 2015-05-28 DIAGNOSIS — F319 Bipolar disorder, unspecified: Secondary | ICD-10-CM | POA: Diagnosis present

## 2015-05-28 DIAGNOSIS — F259 Schizoaffective disorder, unspecified: Secondary | ICD-10-CM | POA: Diagnosis present

## 2015-05-28 DIAGNOSIS — M1990 Unspecified osteoarthritis, unspecified site: Secondary | ICD-10-CM | POA: Diagnosis present

## 2015-05-28 DIAGNOSIS — J45909 Unspecified asthma, uncomplicated: Secondary | ICD-10-CM | POA: Diagnosis present

## 2015-05-28 DIAGNOSIS — L02219 Cutaneous abscess of trunk, unspecified: Secondary | ICD-10-CM

## 2015-05-28 DIAGNOSIS — E7220 Disorder of urea cycle metabolism, unspecified: Secondary | ICD-10-CM | POA: Diagnosis present

## 2015-05-28 DIAGNOSIS — I509 Heart failure, unspecified: Secondary | ICD-10-CM | POA: Diagnosis not present

## 2015-05-28 DIAGNOSIS — Z7401 Bed confinement status: Secondary | ICD-10-CM

## 2015-05-28 DIAGNOSIS — R000 Tachycardia, unspecified: Secondary | ICD-10-CM | POA: Diagnosis present

## 2015-05-28 DIAGNOSIS — Z66 Do not resuscitate: Secondary | ICD-10-CM | POA: Diagnosis present

## 2015-05-28 DIAGNOSIS — N179 Acute kidney failure, unspecified: Secondary | ICD-10-CM | POA: Diagnosis present

## 2015-05-28 DIAGNOSIS — E876 Hypokalemia: Secondary | ICD-10-CM | POA: Diagnosis present

## 2015-05-28 DIAGNOSIS — I252 Old myocardial infarction: Secondary | ICD-10-CM | POA: Diagnosis not present

## 2015-05-28 DIAGNOSIS — J9622 Acute and chronic respiratory failure with hypercapnia: Secondary | ICD-10-CM | POA: Diagnosis present

## 2015-05-28 DIAGNOSIS — R6510 Systemic inflammatory response syndrome (SIRS) of non-infectious origin without acute organ dysfunction: Secondary | ICD-10-CM | POA: Diagnosis present

## 2015-05-28 DIAGNOSIS — E1169 Type 2 diabetes mellitus with other specified complication: Secondary | ICD-10-CM

## 2015-05-28 DIAGNOSIS — Z91013 Allergy to seafood: Secondary | ICD-10-CM | POA: Diagnosis not present

## 2015-05-28 DIAGNOSIS — E662 Morbid (severe) obesity with alveolar hypoventilation: Secondary | ICD-10-CM | POA: Diagnosis not present

## 2015-05-28 DIAGNOSIS — I5033 Acute on chronic diastolic (congestive) heart failure: Secondary | ICD-10-CM | POA: Diagnosis present

## 2015-05-28 DIAGNOSIS — G40909 Epilepsy, unspecified, not intractable, without status epilepticus: Secondary | ICD-10-CM | POA: Diagnosis present

## 2015-05-28 DIAGNOSIS — Z91018 Allergy to other foods: Secondary | ICD-10-CM

## 2015-05-28 DIAGNOSIS — J9621 Acute and chronic respiratory failure with hypoxia: Secondary | ICD-10-CM | POA: Diagnosis present

## 2015-05-28 DIAGNOSIS — R7302 Impaired glucose tolerance (oral): Secondary | ICD-10-CM | POA: Diagnosis present

## 2015-05-28 DIAGNOSIS — J9612 Chronic respiratory failure with hypercapnia: Secondary | ICD-10-CM

## 2015-05-28 DIAGNOSIS — I4892 Unspecified atrial flutter: Secondary | ICD-10-CM | POA: Diagnosis present

## 2015-05-28 DIAGNOSIS — G9340 Encephalopathy, unspecified: Secondary | ICD-10-CM | POA: Diagnosis present

## 2015-05-28 DIAGNOSIS — R51 Headache: Secondary | ICD-10-CM | POA: Diagnosis present

## 2015-05-28 DIAGNOSIS — I4891 Unspecified atrial fibrillation: Secondary | ICD-10-CM | POA: Diagnosis present

## 2015-05-28 DIAGNOSIS — L0390 Cellulitis, unspecified: Secondary | ICD-10-CM

## 2015-05-28 DIAGNOSIS — R0602 Shortness of breath: Secondary | ICD-10-CM | POA: Diagnosis not present

## 2015-05-28 DIAGNOSIS — G2401 Drug induced subacute dyskinesia: Secondary | ICD-10-CM | POA: Diagnosis present

## 2015-05-28 DIAGNOSIS — R451 Restlessness and agitation: Secondary | ICD-10-CM | POA: Diagnosis not present

## 2015-05-28 DIAGNOSIS — K219 Gastro-esophageal reflux disease without esophagitis: Secondary | ICD-10-CM | POA: Diagnosis present

## 2015-05-28 DIAGNOSIS — L8990 Pressure ulcer of unspecified site, unspecified stage: Secondary | ICD-10-CM | POA: Diagnosis present

## 2015-05-28 DIAGNOSIS — B965 Pseudomonas (aeruginosa) (mallei) (pseudomallei) as the cause of diseases classified elsewhere: Secondary | ICD-10-CM | POA: Diagnosis present

## 2015-05-28 DIAGNOSIS — E8809 Other disorders of plasma-protein metabolism, not elsewhere classified: Secondary | ICD-10-CM | POA: Diagnosis present

## 2015-05-28 DIAGNOSIS — F70 Mild intellectual disabilities: Secondary | ICD-10-CM | POA: Diagnosis present

## 2015-05-28 DIAGNOSIS — F419 Anxiety disorder, unspecified: Secondary | ICD-10-CM | POA: Diagnosis present

## 2015-05-28 DIAGNOSIS — K761 Chronic passive congestion of liver: Secondary | ICD-10-CM | POA: Diagnosis present

## 2015-05-28 DIAGNOSIS — Z882 Allergy status to sulfonamides status: Secondary | ICD-10-CM

## 2015-05-28 DIAGNOSIS — E7800 Pure hypercholesterolemia, unspecified: Secondary | ICD-10-CM | POA: Diagnosis present

## 2015-05-28 DIAGNOSIS — Z79899 Other long term (current) drug therapy: Secondary | ICD-10-CM

## 2015-05-28 DIAGNOSIS — J9620 Acute and chronic respiratory failure, unspecified whether with hypoxia or hypercapnia: Secondary | ICD-10-CM

## 2015-05-28 DIAGNOSIS — F79 Unspecified intellectual disabilities: Secondary | ICD-10-CM | POA: Diagnosis present

## 2015-05-28 DIAGNOSIS — L03319 Cellulitis of trunk, unspecified: Secondary | ICD-10-CM | POA: Diagnosis not present

## 2015-05-28 LAB — CBC WITH DIFFERENTIAL/PLATELET
BASOS PCT: 1 %
Basophils Absolute: 0 10*3/uL (ref 0.0–0.1)
Eosinophils Absolute: 0 10*3/uL (ref 0.0–0.7)
Eosinophils Relative: 1 %
HEMATOCRIT: 35.9 % — AB (ref 36.0–46.0)
HEMOGLOBIN: 10.6 g/dL — AB (ref 12.0–15.0)
LYMPHS ABS: 1.8 10*3/uL (ref 0.7–4.0)
LYMPHS PCT: 28 %
MCH: 29.7 pg (ref 26.0–34.0)
MCHC: 29.5 g/dL — AB (ref 30.0–36.0)
MCV: 100.6 fL — AB (ref 78.0–100.0)
MONO ABS: 0.9 10*3/uL (ref 0.1–1.0)
MONOS PCT: 14 %
NEUTROS ABS: 3.7 10*3/uL (ref 1.7–7.7)
NEUTROS PCT: 56 %
Platelets: 257 10*3/uL (ref 150–400)
RBC: 3.57 MIL/uL — ABNORMAL LOW (ref 3.87–5.11)
RDW: 19 % — AB (ref 11.5–15.5)
WBC: 6.4 10*3/uL (ref 4.0–10.5)

## 2015-05-28 LAB — COMPREHENSIVE METABOLIC PANEL
ALK PHOS: 55 U/L (ref 38–126)
ALT: 9 U/L — ABNORMAL LOW (ref 14–54)
ANION GAP: 11 (ref 5–15)
AST: 14 U/L — ABNORMAL LOW (ref 15–41)
Albumin: 2.9 g/dL — ABNORMAL LOW (ref 3.5–5.0)
BILIRUBIN TOTAL: 0.4 mg/dL (ref 0.3–1.2)
BUN: 6 mg/dL (ref 6–20)
CALCIUM: 9.2 mg/dL (ref 8.9–10.3)
CHLORIDE: 96 mmol/L — AB (ref 101–111)
CO2: 35 mmol/L — AB (ref 22–32)
Creatinine, Ser: 2.05 mg/dL — ABNORMAL HIGH (ref 0.44–1.00)
GFR, EST AFRICAN AMERICAN: 29 mL/min — AB (ref >60)
GFR, EST NON AFRICAN AMERICAN: 25 mL/min — AB (ref >60)
GLUCOSE: 100 mg/dL — AB (ref 65–99)
Potassium: 3.4 mmol/L — ABNORMAL LOW (ref 3.5–5.1)
Sodium: 142 mmol/L (ref 135–145)
Total Protein: 6.2 g/dL — ABNORMAL LOW (ref 6.5–8.1)

## 2015-05-28 LAB — I-STAT CG4 LACTIC ACID, ED
LACTIC ACID, VENOUS: 1.02 mmol/L (ref 0.5–2.0)
Lactic Acid, Venous: 0.86 mmol/L (ref 0.5–2.0)

## 2015-05-28 LAB — URINALYSIS, ROUTINE W REFLEX MICROSCOPIC
GLUCOSE, UA: NEGATIVE mg/dL
HGB URINE DIPSTICK: NEGATIVE
KETONES UR: 15 mg/dL — AB
Leukocytes, UA: NEGATIVE
NITRITE: NEGATIVE
PH: 5.5 (ref 5.0–8.0)
Protein, ur: NEGATIVE mg/dL
SPECIFIC GRAVITY, URINE: 1.017 (ref 1.005–1.030)

## 2015-05-28 LAB — I-STAT VENOUS BLOOD GAS, ED
Acid-Base Excess: 12 mmol/L — ABNORMAL HIGH (ref 0.0–2.0)
Bicarbonate: 38.8 mEq/L — ABNORMAL HIGH (ref 20.0–24.0)
O2 Saturation: 77 %
PH VEN: 7.418 — AB (ref 7.250–7.300)
TCO2: 41 mmol/L (ref 0–100)
pCO2, Ven: 60.1 mmHg — ABNORMAL HIGH (ref 45.0–50.0)
pO2, Ven: 43 mmHg (ref 30.0–45.0)

## 2015-05-28 LAB — D-DIMER, QUANTITATIVE (NOT AT ARMC): D DIMER QUANT: 1.22 ug{FEU}/mL — AB (ref 0.00–0.50)

## 2015-05-28 MED ORDER — SODIUM CHLORIDE 0.9 % IV BOLUS (SEPSIS)
1000.0000 mL | Freq: Once | INTRAVENOUS | Status: AC
  Administered 2015-05-28: 1000 mL via INTRAVENOUS

## 2015-05-28 MED ORDER — SODIUM CHLORIDE 0.9 % IV SOLN
1000.0000 mL | INTRAVENOUS | Status: DC
  Administered 2015-05-28 – 2015-05-30 (×4): 1000 mL via INTRAVENOUS

## 2015-05-28 MED ORDER — ACETAMINOPHEN 325 MG PO TABS
650.0000 mg | ORAL_TABLET | Freq: Once | ORAL | Status: AC
  Administered 2015-05-28: 650 mg via ORAL
  Filled 2015-05-28: qty 2

## 2015-05-28 NOTE — ED Notes (Signed)
Pt is from [facility]

## 2015-05-28 NOTE — ED Notes (Signed)
Attempted to call report

## 2015-05-28 NOTE — H&P (Addendum)
[facility] History and Physical  [name]:[id number] DOB: 02/11/1951 DOA: 05/28/2015  Referring physician: ED PCP: Margit Hanks, MD   Chief Complaint: Fever  HPI:  [name] is a 64 year old female who has a past medical history significant for morbidly obese, mental retardation, CHF with unknown LV, seizures, diabetes mellitus, COPD/OHS, A. Fib, and mood disorders; who presents from the [facility] facility for fever. Recently just hospitalized at [facility] long from 11/23 until 12/14 with hypercapnic respiratory failure/ CO2narcosis. Patient reportedly decompensated rapidly and required intubation. Patient with a complicated medical history and due to her history of mental retardation and inability to effectively communicate  Adequate history is not able to be obtained. Patient was noted to have multiple pressure ulcers of the back with cellulitis and a low-grade fever of 100.8 on admission. Initial lab work showed no elevated WBC count, lactic acid level 0.8.  On RA patient's o2 sats  dropped to 80% requiring 2 liters to maintain sats >92%.   Review of Systems  Constitutional: Positive for fever.  Unable to complete a full review of systems secondary to patient's mental handicap.    Past Medical History  Diagnosis Date  . Bipolar disorder (HCC)   . Schizoaffective disorder (HCC)   . Mood disorder (HCC)   . Epilepsy (HCC)   . Thyroid disease   . Hiatal hernia   . Atrial flutter (HCC)   . Schizoaffective disorder   . Urinary tract infection   . Hypertension   . GERD (gastroesophageal reflux disease)   . Hypothyroidism   . Obesity   . Asthma   . COPD (chronic obstructive pulmonary disease) (HCC)   . CHF (congestive heart failure) (HCC)   . Seizures (HCC)   . Morbid obesity (HCC)   . Chronic pain   . Hypercholesteremia   . Anginal pain (HCC)   . Myocardial infarction (HCC) 2002  . Diabetes mellitus     "I'm not diabetic anymore"  (05/23/2012)  . Anemia   . History of blood transfusion 1993    "when I had my hysterectomy" (05/23/2012)  . H/O hiatal hernia   . Daily headache   . Arthritis     "severe; all over my body" (05/23/2012)  . Hypoventilation syndrome     [name] 05/30/2012  . Atrial fibrillation (HCC)   . Atrial flutter, paroxysmal (HCC)     [name] 05/30/2012  . Exertional dyspnea   . Shortness of breath     "all the time lately" (05/30/2012)  . Pneumonia     "several times; including now" (04/23/2013)  . Anxiety   . Episodic mood disorder (HCC)   . Psychosis   . Schizophrenia (HCC)   . 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 CHOLELITHIASIS 11/30/2009    Qualifier: Diagnosis of  By: Brynda Rim       Past Surgical History  Procedure Laterality Date  . Tubal ligation  1998  . Cholecystectomy  ?2008  . Cardiac catheterization  2008  . Vaginal hysterectomy  1993      Social History:  reports that she quit smoking about 14 years ago. Her smoking use included Cigarettes. She has a 24 pack-year smoking history. Her smokeless tobacco use includes Chew. She reports that she does not drink alcohol or use illicit drugs. Where does patient live--SNF Can patient participate in ADLs? Needs assistance with all  Allergies  Allergen Reactions  .  Food Rash    "lasagna"= rash from the noodles  . Penicillins Hives    Per MAR- unable to ask quantification questions Can tolerate cephalosporins  . Sulfamethoxazole-Trimethoprim Hives and Nausea And Vomiting    Family History  Problem Relation Age of Onset  . Heart disease Father   . Cancer Mother 40    Breast       Prior to Admission medications   Medication Sig Start Date End Date Taking? Authorizing Provider  Amino Acids-Protein Hydrolys (FEEDING SUPPLEMENT, PRO-STAT SUGAR FREE 64,) LIQD Take 30 mLs by mouth 3 (three) times daily. 05/21/15  Yes Nishant Dhungel, MD  aspirin 81 MG chewable  tablet Chew 81 mg by mouth daily.   Yes Historical Provider, MD  bisacodyl (DULCOLAX) 10 MG suppository Place 10 mg rectally every 3 (three) days as needed for mild constipation or moderate constipation.   Yes Historical Provider, MD  chlorproMAZINE (THORAZINE) 50 MG tablet Take 50-100 mg by mouth. Take 50 mg twice daily (at 1200 and 1800) and take 100 mg at 2300 05/07/15  Yes Historical Provider, MD  collagenase (SANTYL) ointment Apply topically daily. Apply to necrotic ulcer on b/l flanks and right shoulder 05/21/15  Yes Nishant Dhungel, MD  diphenhydrAMINE (BENADRYL) 25 MG tablet Take 25 mg by mouth every 8 (eight) hours as needed for itching.   Yes Historical Provider, MD  divalproex (DEPAKOTE ER) 250 MG 24 hr tablet Take 1,250 mg by mouth at bedtime.   Yes Historical Provider, MD  furosemide (LASIX) 20 MG tablet Take 2 tablets (40 mg total) by mouth 2 (two) times daily. 05/21/15  Yes Nishant Dhungel, MD  lamoTRIgine (LAMICTAL) 100 MG tablet Take 100 mg by mouth 2 (two) times daily. For mood disorder   Yes Historical Provider, MD  levothyroxine (SYNTHROID, LEVOTHROID) 150 MCG tablet Take 150 mcg by mouth every morning. For thyroid disease. 05/11/12  Yes Lloyd Huger T Mashburn, PA-C  lisinopril (PRINIVIL,ZESTRIL) 5 MG tablet Take 1 tablet (5 mg total) by mouth daily. 04/22/15  Yes Sharee Holster, NP  LORazepam (ATIVAN) 0.5 MG tablet Take one tablet by mouth three times daily as needed for anxiety 05/21/15  Yes Nishant Dhungel, MD  nicotine (NICODERM CQ - DOSED IN MG/24 HOURS) 21 mg/24hr patch Place 1 patch (21 mg total) onto the skin daily. 05/21/15  Yes Nishant Dhungel, MD  omeprazole (PRILOSEC) 40 MG capsule Take 40 mg by mouth daily.   Yes Historical Provider, MD  PRESCRIPTION MEDICATION Apply 1 application topically. Compounded ativan 0.5mg  gel.  Apply 1mg /3ml twice daily as needed for anxiety   Yes Historical Provider, MD  magnesium hydroxide (MILK OF MAGNESIA) 400 MG/5ML suspension Take 30 mLs by mouth  daily as needed for mild constipation.    Historical Provider, MD     Physical Exam: Filed Vitals:   05/28/15 2200 05/28/15 2215 05/28/15 2230 05/28/15 2245  BP: 121/73 115/69 111/71 106/66  Pulse: 92 92 94 89  Temp:      TempSrc:      Resp: 19 15 23 22   Height:      Weight:      SpO2: 100% 94% 100% 93%     Constitutional: Vital signs reviewed. Patient is  morbidly obese female who is bedbound and appears to be confused .  Head: Normocephalic and atraumatic  Ear: TM normal bilaterally  Mouth: no erythema or exudates, MMM  Eyes: PERRL, EOMI, conjunctivae normal, No scleral icterus.  Neck: Supple, Trachea midline normal ROM, No JVD, mass, thyromegaly,  or carotid bruit present.  Cardiovascular: Heart rate appears to be regular at this time, but heart sounds are distant  Pulmonary/Chest: Patient with decreased overall air movement Abdominal: Soft. Non-tender, non-distended, bowel sounds are normal, no masses, organomegaly, or guarding present.  GU: no CVA tenderness Musculoskeletal: Able to move upper extremities \ Ext: +2 pitting edema and no cyanosis, pulses palpable bilaterally (DP and PT)  Hematology: no cervical, inginal, or axillary adenopathy.  Neurological: Alert unable to move bilateral lower extremities  Skin: Patient with multiple stage 2-3 pressure ulcerations seen of the back with 1-2inch areas of surrounding erythema with purulent drainage noted. Unable to fully assess ulcerations of patient's ulcers of the bottom Psychiatric: Possible echolalia      Data Review   Micro Results No results found for this or any previous visit (from the past 240 hour(s)).  Radiology Reports Dg Chest Port 1 View  05/28/2015  CLINICAL DATA:  Hypoxia, decreased O2 sats. EXAM: PORTABLE CHEST 1 VIEW COMPARISON:  05/17/2015. FINDINGS: Trachea is midline given patient rotation. Heart is enlarged, stable. Lungs are somewhat low in volume with probable vascular crowding. No definite pleural  fluid. IMPRESSION: Low lung volumes with probable vascular crowding. No definite edema. Electronically Signed   By: Leanna Battles M.D.   On: 05/28/2015 20:02   Dg Chest Port 1 View  05/17/2015  CLINICAL DATA:  Endotracheal tube.  COPD.  Asthma EXAM: PORTABLE CHEST 1 VIEW COMPARISON:  05/16/2015 FINDINGS: Interval extubation and removal of NG tube. Stable enlarged cardiac silhouette. There is mild increase in fine airspace disease in the upper lobes. Low lung volumes. No increase in atelectasis. LEFT central venous line unchanged. IMPRESSION: 1. Stable lung volumes following extubation. 2. Slight increase in upper lobe airspace disease suggests mild pulmonary edema. Electronically Signed   By: Genevive Bi M.D.   On: 05/17/2015 07:13   Dg Chest Port 1 View  05/16/2015  CLINICAL DATA:  64 year old female currently intubated EXAM: PORTABLE CHEST 1 VIEW COMPARISON:  Prior chest x-ray 05/15/2015 FINDINGS: The tip of the endotracheal tube is 2.4 cm above the carina. A nasogastric tube is present and is coiled within the gastric body. Left subclavian approach central venous catheter remains in unchanged position with the tip overlying the superior cavoatrial junction. Persistent low inspiratory volumes with bibasilar atelectasis versus infiltrate. Stable mild cardiomegaly. Slight blurring of the interstitial markings likely secondary to patient motion. Overall, no significant interval change in the appearance of the chest. No acute osseous abnormality. IMPRESSION: 1. The tip the endotracheal tube is 2.4 cm above the carina. Other support apparatus also in satisfactory and unchanged position. 2. No significant interval change in the appearance of the lungs with persistent low inspiratory volumes and bibasilar atelectasis versus infiltrate. 3. Unchanged borderline cardiomegaly. Electronically Signed   By: Malachy Moan M.D.   On: 05/16/2015 09:29   Portable Chest Xray  05/15/2015  CLINICAL DATA:   Intubation. EXAM: PORTABLE CHEST 1 VIEW COMPARISON:  05/14/2015. FINDINGS: Endotracheal tube 3 cm above the carina. Left subclavian central line stable position. NG tube in stable position. Mediastinum and hilar structures are unremarkable. Cardiomegaly with normal pulmonary vascularity. Bibasilar subsegmental atelectasis and/or infiltrates. Small right pleural effusion cannot be excluded. No pneumothorax . IMPRESSION: 1. Endotracheal tube 3 cm above the carina. Remaining lines and tubes in stable position. 2. Bibasilar subsegmental atelectasis and/or mild infiltrates. 3. Stable cardiomegaly . Electronically Signed   By: Maisie Fus  Register   On: 05/15/2015 07:06   Dg Chest [facility]  1 View  05/14/2015  CLINICAL DATA:  Central line and orogastric tube placement. Altered mental status. Initial encounter. EXAM: PORTABLE CHEST 1 VIEW COMPARISON:  Chest radiograph performed 05/13/2015 FINDINGS: The patient's endotracheal tube is seen ending just above the carina. This should be retracted 2-3 cm. An enteric tube is noted extending below the diaphragm. Patchy bilateral airspace opacification may reflect atelectasis. No definite pleural effusion or pneumothorax is seen. The cardiomediastinal silhouette is enlarged. No acute osseous abnormalities are identified. IMPRESSION: 1. Endotracheal tube seen ending just above the carina. This should be retracted 2-3 cm. 2. Patchy bilateral airspace opacification may reflect atelectasis. Cardiomegaly noted. Electronically Signed   By: Roanna Raider M.D.   On: 05/14/2015 06:24   Dg Chest Port 1 View  05/13/2015  CLINICAL DATA:  Hypoxia. Diabetes. Shortness of breath. Former smoker for 14 years. EXAM: PORTABLE CHEST 1 VIEW COMPARISON:  05/23/2013 FINDINGS: Shallow inspiration. Cardiac enlargement without significant vascular congestion. Mild volume loss in the right upper lung is probably due to shallow inspiration. No focal consolidation or airspace disease is appreciated. No  blunting of costophrenic angles. No pneumothorax. Mediastinal contours appear intact. IMPRESSION: Cardiac enlargement.  Shallow inspiration.  No focal consolidation. Electronically Signed   By: Burman Nieves M.D.   On: 05/13/2015 20:06     CBC  Recent Labs Lab 05/28/15 1948  WBC 6.4  HGB 10.6*  HCT 35.9*  PLT 257  MCV 100.6*  MCH 29.7  MCHC 29.5*  RDW 19.0*  LYMPHSABS 1.8  MONOABS 0.9  EOSABS 0.0  BASOSABS 0.0    Chemistries   Recent Labs Lab 05/28/15 1948  NA 142  K 3.4*  CL 96*  CO2 35*  GLUCOSE 100*  BUN 6  CREATININE 2.05*  CALCIUM 9.2  AST 14*  ALT 9*  ALKPHOS 55  BILITOT 0.4   ------------------------------------------------------------------------------------------------------------------ estimated creatinine clearance is 46.9 mL/min (by C-G formula based on Cr of 2.05). ------------------------------------------------------------------------------------------------------------------ No results for input(s): HGBA1C in the last 72 hours. ------------------------------------------------------------------------------------------------------------------ No results for input(s): CHOL, HDL, LDLCALC, TRIG, CHOLHDL, LDLDIRECT in the last 72 hours. ------------------------------------------------------------------------------------------------------------------ No results for input(s): TSH, T4TOTAL, T3FREE, THYROIDAB in the last 72 hours.  Invalid input(s): FREET3 ------------------------------------------------------------------------------------------------------------------ No results for input(s): VITAMINB12, FOLATE, FERRITIN, TIBC, IRON, RETICCTPCT in the last 72 hours.  Coagulation profile No results for input(s): INR, PROTIME in the last 168 hours.  No results for input(s): DDIMER in the last 72 hours.  Cardiac Enzymes No results for input(s): CKMB, TROPONINI, MYOGLOBIN in the last 168 hours.  Invalid input(s):  CK ------------------------------------------------------------------------------------------------------------------ Invalid input(s): POCBNP   CBG: No results for input(s): GLUCAP in the last 168 hours.     EKPending Assessment/Plan Active Problems:  Suspected Sepsis secondary to pressure ulcers with surrounding cellulitis: Patient is a chronically ill woman who lives in a nursing facility presenting with fever of 100.8 and tachycardia heart rates of 100. Lactic acid level I.02, white blood cell count Found to have multiple pressure ulcers with surrounding erythema. Patient known to deteriorate quickly. Suspect possibility of MRSA infection. -Admit to telemetry bed -blood cultures 2  as well as pressure ulcer wound culture  -Empiric antibiotics of vancomycin -Low air mattress for pressure ulcers   -Wound care consult ordered -May want to consult surgery in a.m.  - consult social work and care management    Elevated d-dimer: D-dimer 1.22 on admission, previously in 07/2012 noted to be 1.1. Patient with mild tachycardia. Can not completely rule out the possibility of a DVT,  but appears less likely as the previous review shows that levels have been chronically elevated. -Checking a vascular duplex ultrasound lower extremities  COPD/OHS: Appears to be a chronic. May consider -CPAP at night for respiratory -Nasal cannula oxygen continuous 3 L continuous pulse oximetry -Albuterol treatments as needed   Atrial fibrillation : Patient appears to have a regular rate at this time. -Check EKG  CHF history unknown LVEF -Strict ins and outs -? BnP  utility  Acute kidney injury: Patient with a baseline creatinine within normal limits. Creatinine acutely elevated at 2.05 on admission -IV fluids of normal saline at 125 mL per hour -Checking spot Urine sodium and urine creatinine - check a renal ultrasound    Diabetes mellitus type 2: Initial blood glucose 100 -Check CBGs every 4   -Sensitive sliding scale insulin   Morbid obesity: BMI Last reported 72 - bariatric bed   Hypoalbuminemia: albumin noted to be 2.9 with decreased total protein of 6.2 -  continue Protostat sugar-free supplementation per nursing home regimen   Hypothyroidism -Continue levothyroxine  Hypokalemia: potassium noted to be 3.4 -Replaced with potassium chloride - Continue to monitor and replace as needed  Psychiatric history including MR/bipolar/schizophrenia/tardive dyskinesia -Continue chlorpromazine and pr ativan  Seizure history -Continue lamotrigine  Code Status:   full Family Communication: bedside Disposition Plan: admit   Total time spent 55 minutes.Greater than 50% of this time was spent in counseling, explanation of diagnosis, planning of further management, and coordination of care  Clydie Braun [facility] Pager [phone]  If 7PM-7AM, please contact night-coverage [email or web address] Password [facility] 05/28/2015, 10:51 PM

## 2015-05-28 NOTE — ED Notes (Signed)
Per EMS, called out for hypoxia, on RA pt dropped to 80%, placed on 4L and came up to 90%, then placed on NRB and came up to 100%, the placed on 2L [place] and pt maintained at 95%. pt has multiple bed sores on back and right posterior arm, left posterior arm, pt recently admitted here for treatment of sores and is on antibiotics. Pt is confused but at her baseline per the facility. Pt has hx of copd and chf.

## 2015-05-28 NOTE — ED Notes (Signed)
Becca RN asked that we wait to transport the pt to the floor because the bed is dirty and it needs to be cleaned. Will transport when bed is clean.

## 2015-05-28 NOTE — ED Provider Notes (Signed)
CSN: [id number]     Arrival date & time 05/28/15  1938 History   First MD Initiated Contact with Patient 05/28/15 1940     Chief Complaint  Patient presents with  . Shortness of Breath  . Pressure Ulcer     (Consider location/radiation/quality/duration/timing/severity/associated sxs/prior Treatment) HPI Comments: Pt comes in with cc of fever. Pt is not a good historian. She is morbidly obese female with multiple pressure ulcer all over her back, DM, Aflutter, CAD and recent admission for septic shock. She is residing at a nursing home and was noted to be hypoxic in the 51s - so EMS was called. Pt reports pain all over. She reports cough with clear drainage. She has no uti like sx. She denies nausea and emesis. She has no bloody sputum or any phlegm.   ROS 10 Systems reviewed and are negative for acute change except as noted in the HPI.     Patient is a 64 y.o. female presenting with shortness of breath. The history is provided by the patient.  Shortness of Breath   Past Medical History  Diagnosis Date  . Bipolar disorder (HCC)   . Schizoaffective disorder (HCC)   . Mood disorder (HCC)   . Epilepsy (HCC)   . Thyroid disease   . Hiatal hernia   . Atrial flutter (HCC)   . Schizoaffective disorder   . Urinary tract infection   . Hypertension   . GERD (gastroesophageal reflux disease)   . Hypothyroidism   . Obesity   . Asthma   . COPD (chronic obstructive pulmonary disease) (HCC)   . CHF (congestive heart failure) (HCC)   . Seizures (HCC)   . Morbid obesity (HCC)   . Chronic pain   . Hypercholesteremia   . Anginal pain (HCC)   . Myocardial infarction (HCC) 2002  . Diabetes mellitus     "I'm not diabetic anymore" (05/23/2012)  . Anemia   . History of blood transfusion 1993    "when I had my hysterectomy" (05/23/2012)  . H/O hiatal hernia   . Daily headache   . Arthritis     "severe; all over my body" (05/23/2012)  . Hypoventilation syndrome     [name] 05/30/2012  .  Atrial fibrillation (HCC)   . Atrial flutter, paroxysmal (HCC)     [name] 05/30/2012  . Exertional dyspnea   . Shortness of breath     "all the time lately" (05/30/2012)  . Pneumonia     "several times; including now" (04/23/2013)  . Anxiety   . Episodic mood disorder (HCC)   . Psychosis   . Schizophrenia (HCC)   . 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 CHOLELITHIASIS 11/30/2009    Qualifier: Diagnosis of  By: Brynda Rim     Past Surgical History  Procedure Laterality Date  . Tubal ligation  1998  . Cholecystectomy  ?2008  . Cardiac catheterization  2008  . Vaginal hysterectomy  1993   Family History  Problem Relation Age of Onset  . Heart disease Father   . Cancer Mother 65    Breast   Social History  Substance Use Topics  . Smoking status: Former Smoker -- 2.00 packs/day for 12 years    Types: Cigarettes    Quit date: 06/29/2000  . Smokeless tobacco: Current User    Types: Chew  . Alcohol Use: No   OB History    Gravida Para Term  Preterm AB TAB SAB Ectopic Multiple Living   0 0 0 0 0 0 0 0       Review of Systems  Respiratory: Positive for shortness of breath.       Allergies  Food; Penicillins; and Sulfamethoxazole-trimethoprim  Home Medications   Prior to Admission medications   Medication Sig Start Date End Date Taking? Authorizing Provider  Amino Acids-Protein Hydrolys (FEEDING SUPPLEMENT, PRO-STAT SUGAR FREE 64,) LIQD Take 30 mLs by mouth 3 (three) times daily. 05/21/15  Yes Nishant Dhungel, MD  aspirin 81 MG chewable tablet Chew 81 mg by mouth daily.   Yes Historical Provider, MD  bisacodyl (DULCOLAX) 10 MG suppository Place 10 mg rectally every 3 (three) days as needed for mild constipation or moderate constipation.   Yes Historical Provider, MD  chlorproMAZINE (THORAZINE) 50 MG tablet Take 50-100 mg by mouth. Take 50 mg twice daily (at 1200 and 1800) and take 100 mg at 2300 05/07/15  Yes  Historical Provider, MD  collagenase (SANTYL) ointment Apply topically daily. Apply to necrotic ulcer on b/l flanks and right shoulder 05/21/15  Yes Nishant Dhungel, MD  diphenhydrAMINE (BENADRYL) 25 MG tablet Take 25 mg by mouth every 8 (eight) hours as needed for itching.   Yes Historical Provider, MD  divalproex (DEPAKOTE ER) 250 MG 24 hr tablet Take 1,250 mg by mouth at bedtime.   Yes Historical Provider, MD  furosemide (LASIX) 20 MG tablet Take 2 tablets (40 mg total) by mouth 2 (two) times daily. 05/21/15  Yes Nishant Dhungel, MD  lamoTRIgine (LAMICTAL) 100 MG tablet Take 100 mg by mouth 2 (two) times daily. For mood disorder   Yes Historical Provider, MD  levothyroxine (SYNTHROID, LEVOTHROID) 150 MCG tablet Take 150 mcg by mouth every morning. For thyroid disease. 05/11/12  Yes Lloyd Huger T Mashburn, PA-C  lisinopril (PRINIVIL,ZESTRIL) 5 MG tablet Take 1 tablet (5 mg total) by mouth daily. 04/22/15  Yes Sharee Holster, NP  LORazepam (ATIVAN) 0.5 MG tablet Take one tablet by mouth three times daily as needed for anxiety 05/21/15  Yes Nishant Dhungel, MD  nicotine (NICODERM CQ - DOSED IN MG/24 HOURS) 21 mg/24hr patch Place 1 patch (21 mg total) onto the skin daily. 05/21/15  Yes Nishant Dhungel, MD  omeprazole (PRILOSEC) 40 MG capsule Take 40 mg by mouth daily.   Yes Historical Provider, MD  PRESCRIPTION MEDICATION Apply 1 application topically. Compounded ativan 0.5mg  gel.  Apply 1mg /70ml twice daily as needed for anxiety   Yes Historical Provider, MD  magnesium hydroxide (MILK OF MAGNESIA) 400 MG/5ML suspension Take 30 mLs by mouth daily as needed for mild constipation.    Historical Provider, MD   BP 106/66 mmHg  Pulse 89  Temp(Src) 99.4 F (37.4 C) (Rectal)  Resp 22  Ht 5\' 2"  (1.575 m)  Wt 409 lb (185.521 kg)  BMI 74.79 kg/m2  SpO2 93% Physical Exam  Constitutional: She appears well-developed and well-nourished.  Morbidly obese  HENT:  Head: Normocephalic and atraumatic.  Eyes: EOM are  normal.  Neck: Neck supple.  Cardiovascular: Normal rate, regular rhythm and normal heart sounds.   Pulmonary/Chest: Effort normal. No respiratory distress.  Abdominal: Soft. She exhibits no distension. There is no tenderness. There is no rebound and no guarding.  Musculoskeletal: She exhibits tenderness.  LLE calf tenderness  Neurological: She is alert.  Skin: Skin is warm and dry.  Several pressure ulcers all over the back with different size and shapes. Mostly stage 2 ulcers, with granulation tissue. Size  1-6 cm in DM for most of them.  Nursing note and vitals reviewed.   ED Course  Procedures (including critical care time) Labs Review Labs Reviewed  COMPREHENSIVE METABOLIC PANEL - Abnormal; Notable for the following:    Potassium 3.4 (*)    Chloride 96 (*)    CO2 35 (*)    Glucose, Bld 100 (*)    Creatinine, Ser 2.05 (*)    Total Protein 6.2 (*)    Albumin 2.9 (*)    AST 14 (*)    ALT 9 (*)    GFR calc non Af Amer 25 (*)    GFR calc Af Amer 29 (*)    All other components within normal limits  CBC WITH DIFFERENTIAL/PLATELET - Abnormal; Notable for the following:    RBC 3.57 (*)    Hemoglobin 10.6 (*)    HCT 35.9 (*)    MCV 100.6 (*)    MCHC 29.5 (*)    RDW 19.0 (*)    All other components within normal limits  URINALYSIS, ROUTINE W REFLEX MICROSCOPIC (NOT AT [facility]) - Abnormal; Notable for the following:    Color, Urine AMBER (*)    APPearance CLOUDY (*)    Bilirubin Urine SMALL (*)    Ketones, ur 15 (*)    All other components within normal limits  I-STAT VENOUS BLOOD GAS, ED - Abnormal; Notable for the following:    pH, Ven 7.418 (*)    pCO2, Ven 60.1 (*)    Bicarbonate 38.8 (*)    Acid-Base Excess 12.0 (*)    All other components within normal limits  CULTURE, BLOOD (ROUTINE X 2)  CULTURE, BLOOD (ROUTINE X 2)  URINE CULTURE  D-DIMER, QUANTITATIVE (NOT AT [facility])  I-STAT CG4 LACTIC ACID, ED  I-STAT CG4 LACTIC ACID, ED    Imaging Review Dg Chest Port 1  View  05/28/2015  CLINICAL DATA:  Hypoxia, decreased O2 sats. EXAM: PORTABLE CHEST 1 VIEW COMPARISON:  05/17/2015. FINDINGS: Trachea is midline given patient rotation. Heart is enlarged, stable. Lungs are somewhat low in volume with probable vascular crowding. No definite pleural fluid. IMPRESSION: Low lung volumes with probable vascular crowding. No definite edema. Electronically Signed   By: Leanna Battles M.D.   On: 05/28/2015 20:02   I have personally reviewed and evaluated these images and lab results as part of my medical decision-making.   EKG Interpretation None      MDM   Final diagnoses:  AKI (acute kidney injury) (HCC)  SIRS (systemic inflammatory response syndrome) (HCC)  Pressure ulcer    Pt comes in with cc of hypoxia and is noted to have a fever. CXR is clear and lng exam is severely limited due to body habitus - but there was no obvious rhonchi. She is at risk for PE due to her obesity, poor ambulation and recent admission - we will get dimer. She had calf pain - so US DVT ordered. Admitting team can consider VQ scan as her Cr is elevated.  Pt has a low grade temp. Could be due to DVT - but infection also possible. Her UA is clean, CXR is clear. She has several pressure ulcers, but she is on antibiotics for that and there is no clear evidence of abscess/infection for the skin. We will get blood cultures. No antibiotics to be initiated in the ER.  Pt also has AKI, as her Cr more than doubled.  Derwood Kaplan, MD 05/28/15 [phone]

## 2015-05-28 NOTE — Progress Notes (Signed)
Received report.  However, bed in room is dirty.  Housekeeping has been called to clean.  Will notify ED when room and bed are ready.

## 2015-05-29 ENCOUNTER — Inpatient Hospital Stay (HOSPITAL_COMMUNITY): Payer: Medicare Other

## 2015-05-29 DIAGNOSIS — N179 Acute kidney failure, unspecified: Secondary | ICD-10-CM

## 2015-05-29 DIAGNOSIS — L02219 Cutaneous abscess of trunk, unspecified: Secondary | ICD-10-CM

## 2015-05-29 DIAGNOSIS — R6510 Systemic inflammatory response syndrome (SIRS) of non-infectious origin without acute organ dysfunction: Secondary | ICD-10-CM

## 2015-05-29 DIAGNOSIS — L03319 Cellulitis of trunk, unspecified: Secondary | ICD-10-CM

## 2015-05-29 DIAGNOSIS — F258 Other schizoaffective disorders: Secondary | ICD-10-CM

## 2015-05-29 DIAGNOSIS — F259 Schizoaffective disorder, unspecified: Secondary | ICD-10-CM

## 2015-05-29 DIAGNOSIS — J9620 Acute and chronic respiratory failure, unspecified whether with hypoxia or hypercapnia: Secondary | ICD-10-CM

## 2015-05-29 LAB — MRSA PCR SCREENING: MRSA BY PCR: NEGATIVE

## 2015-05-29 LAB — CBC
HCT: 29.6 % — ABNORMAL LOW (ref 36.0–46.0)
Hemoglobin: 8.7 g/dL — ABNORMAL LOW (ref 12.0–15.0)
MCH: 29.9 pg (ref 26.0–34.0)
MCHC: 29.4 g/dL — AB (ref 30.0–36.0)
MCV: 101.7 fL — AB (ref 78.0–100.0)
PLATELETS: 214 10*3/uL (ref 150–400)
RBC: 2.91 MIL/uL — AB (ref 3.87–5.11)
RDW: 18.8 % — AB (ref 11.5–15.5)
WBC: 6 10*3/uL (ref 4.0–10.5)

## 2015-05-29 LAB — BASIC METABOLIC PANEL
ANION GAP: 5 (ref 5–15)
BUN: 8 mg/dL (ref 6–20)
CHLORIDE: 98 mmol/L — AB (ref 101–111)
CO2: 36 mmol/L — AB (ref 22–32)
Calcium: 8.1 mg/dL — ABNORMAL LOW (ref 8.9–10.3)
Creatinine, Ser: 1.9 mg/dL — ABNORMAL HIGH (ref 0.44–1.00)
GFR calc non Af Amer: 27 mL/min — ABNORMAL LOW (ref >60)
GFR, EST AFRICAN AMERICAN: 32 mL/min — AB (ref >60)
Glucose, Bld: 91 mg/dL (ref 65–99)
POTASSIUM: 3.5 mmol/L (ref 3.5–5.1)
SODIUM: 139 mmol/L (ref 135–145)

## 2015-05-29 LAB — PHOSPHORUS: Phosphorus: 4.7 mg/dL — ABNORMAL HIGH (ref 2.5–4.6)

## 2015-05-29 LAB — BRAIN NATRIURETIC PEPTIDE: B NATRIURETIC PEPTIDE 5: 100.4 pg/mL — AB (ref 0.0–100.0)

## 2015-05-29 LAB — GLUCOSE, CAPILLARY
GLUCOSE-CAPILLARY: 83 mg/dL (ref 65–99)
GLUCOSE-CAPILLARY: 100 mg/dL — AB (ref 65–99)
GLUCOSE-CAPILLARY: 92 mg/dL (ref 65–99)
GLUCOSE-CAPILLARY: 89 mg/dL (ref 65–99)
Glucose-Capillary: 87 mg/dL (ref 65–99)
Glucose-Capillary: 96 mg/dL (ref 65–99)

## 2015-05-29 LAB — PREALBUMIN: PREALBUMIN: 9.6 mg/dL — AB (ref 18–38)

## 2015-05-29 LAB — MAGNESIUM: MAGNESIUM: 1.6 mg/dL — AB (ref 1.7–2.4)

## 2015-05-29 LAB — TSH: TSH: 3.76 u[IU]/mL (ref 0.350–4.500)

## 2015-05-29 MED ORDER — COLLAGENASE 250 UNIT/GM EX OINT
TOPICAL_OINTMENT | Freq: Every day | CUTANEOUS | Status: DC
  Administered 2015-05-30: 16:00:00 via TOPICAL
  Administered 2015-05-31: 1 via TOPICAL
  Administered 2015-06-01 – 2015-06-04 (×4): via TOPICAL
  Filled 2015-05-29 (×4): qty 30

## 2015-05-29 MED ORDER — POTASSIUM CHLORIDE 10 MEQ/100ML IV SOLN
10.0000 meq | INTRAVENOUS | Status: AC
  Administered 2015-05-29 (×2): 10 meq via INTRAVENOUS
  Filled 2015-05-29 (×2): qty 100

## 2015-05-29 MED ORDER — LEVOTHYROXINE SODIUM 150 MCG PO TABS
150.0000 ug | ORAL_TABLET | Freq: Every day | ORAL | Status: DC
  Administered 2015-05-29 – 2015-05-31 (×3): 150 ug via ORAL
  Filled 2015-05-29 (×3): qty 1

## 2015-05-29 MED ORDER — PRO-STAT SUGAR FREE PO LIQD
30.0000 mL | Freq: Three times a day (TID) | ORAL | Status: DC
  Administered 2015-05-29 – 2015-06-04 (×11): 30 mL via ORAL
  Filled 2015-05-29 (×17): qty 30

## 2015-05-29 MED ORDER — HEPARIN SODIUM (PORCINE) 5000 UNIT/ML IJ SOLN
5000.0000 [IU] | Freq: Three times a day (TID) | INTRAMUSCULAR | Status: DC
  Administered 2015-05-29 – 2015-06-04 (×20): 5000 [IU] via SUBCUTANEOUS
  Filled 2015-05-29 (×17): qty 1

## 2015-05-29 MED ORDER — ALBUTEROL SULFATE (2.5 MG/3ML) 0.083% IN NEBU
2.5000 mg | INHALATION_SOLUTION | RESPIRATORY_TRACT | Status: DC | PRN
  Administered 2015-05-30: 2.5 mg via RESPIRATORY_TRACT
  Filled 2015-05-29: qty 3

## 2015-05-29 MED ORDER — DIVALPROEX SODIUM ER 500 MG PO TB24
1250.0000 mg | ORAL_TABLET | Freq: Every day | ORAL | Status: DC
  Administered 2015-05-29 – 2015-06-03 (×5): 1250 mg via ORAL
  Filled 2015-05-29 (×9): qty 1

## 2015-05-29 MED ORDER — ACETAMINOPHEN 325 MG PO TABS
650.0000 mg | ORAL_TABLET | Freq: Four times a day (QID) | ORAL | Status: DC | PRN
  Administered 2015-05-31: 650 mg via ORAL
  Filled 2015-05-29: qty 2

## 2015-05-29 MED ORDER — LAMOTRIGINE 100 MG PO TABS
100.0000 mg | ORAL_TABLET | Freq: Two times a day (BID) | ORAL | Status: DC
  Administered 2015-05-29 – 2015-06-04 (×11): 100 mg via ORAL
  Filled 2015-05-29 (×14): qty 1

## 2015-05-29 MED ORDER — NICOTINE 21 MG/24HR TD PT24
21.0000 mg | MEDICATED_PATCH | Freq: Every day | TRANSDERMAL | Status: DC
  Administered 2015-05-29 – 2015-06-04 (×7): 21 mg via TRANSDERMAL
  Filled 2015-05-29 (×8): qty 1

## 2015-05-29 MED ORDER — SODIUM CHLORIDE 0.9 % IJ SOLN
3.0000 mL | Freq: Two times a day (BID) | INTRAMUSCULAR | Status: DC
  Administered 2015-05-29 – 2015-05-30 (×3): 3 mL via INTRAVENOUS
  Administered 2015-05-31: 22:00:00 via INTRAVENOUS
  Administered 2015-05-31 – 2015-06-02 (×4): 3 mL via INTRAVENOUS

## 2015-05-29 MED ORDER — ASPIRIN 81 MG PO CHEW
81.0000 mg | CHEWABLE_TABLET | Freq: Every day | ORAL | Status: DC
  Administered 2015-05-29 – 2015-06-04 (×5): 81 mg via ORAL
  Filled 2015-05-29 (×7): qty 1

## 2015-05-29 MED ORDER — SODIUM CHLORIDE 0.9 % IV BOLUS (SEPSIS)
500.0000 mL | Freq: Once | INTRAVENOUS | Status: AC
  Administered 2015-05-29: 500 mL via INTRAVENOUS

## 2015-05-29 MED ORDER — CHLORPROMAZINE HCL 50 MG PO TABS
50.0000 mg | ORAL_TABLET | ORAL | Status: DC
  Administered 2015-05-29 – 2015-05-30 (×3): 50 mg via ORAL
  Filled 2015-05-29 (×4): qty 1

## 2015-05-29 MED ORDER — CHLORPROMAZINE HCL 100 MG PO TABS
100.0000 mg | ORAL_TABLET | ORAL | Status: DC
  Administered 2015-05-29: 100 mg via ORAL
  Filled 2015-05-29 (×3): qty 1

## 2015-05-29 MED ORDER — ACETAMINOPHEN 650 MG RE SUPP
650.0000 mg | Freq: Four times a day (QID) | RECTAL | Status: DC | PRN
  Filled 2015-05-29: qty 1

## 2015-05-29 MED ORDER — SODIUM CHLORIDE 0.9 % IV SOLN
1750.0000 mg | INTRAVENOUS | Status: DC
  Administered 2015-05-29 – 2015-05-31 (×3): 1750 mg via INTRAVENOUS
  Filled 2015-05-29 (×3): qty 1750

## 2015-05-29 MED ORDER — CEFTAZIDIME 2 G IJ SOLR
2.0000 g | Freq: Two times a day (BID) | INTRAMUSCULAR | Status: DC
Start: 2015-05-29 — End: 2015-05-31
  Administered 2015-05-29 – 2015-05-31 (×5): 2 g via INTRAVENOUS
  Filled 2015-05-29 (×7): qty 2

## 2015-05-29 MED ORDER — PANTOPRAZOLE SODIUM 40 MG PO TBEC
40.0000 mg | DELAYED_RELEASE_TABLET | Freq: Every day | ORAL | Status: DC
  Administered 2015-05-29 – 2015-05-31 (×3): 40 mg via ORAL
  Filled 2015-05-29 (×3): qty 1

## 2015-05-29 MED ORDER — BISACODYL 10 MG RE SUPP: 10.0000 mg | RECTAL | Status: DC | PRN

## 2015-05-29 MED ORDER — IPRATROPIUM-ALBUTEROL 0.5-2.5 (3) MG/3ML IN SOLN
3.0000 mL | Freq: Four times a day (QID) | RESPIRATORY_TRACT | Status: DC
  Administered 2015-05-29 – 2015-06-03 (×23): 3 mL via RESPIRATORY_TRACT
  Filled 2015-05-29 (×23): qty 3

## 2015-05-29 MED ORDER — DIPHENHYDRAMINE HCL 25 MG PO CAPS: 25.0000 mg | ORAL_CAPSULE | Freq: Three times a day (TID) | ORAL | Status: DC | PRN

## 2015-05-29 MED ORDER — LORAZEPAM 0.5 MG PO TABS: 0.5000 mg | ORAL_TABLET | Freq: Three times a day (TID) | ORAL | Status: DC | PRN

## 2015-05-29 MED ORDER — INSULIN ASPART 100 UNIT/ML ~~LOC~~ SOLN: 0.0000 [IU] | SUBCUTANEOUS | Status: DC

## 2015-05-29 NOTE — Progress Notes (Signed)
BP this evening was 88/44.  Patient stated she was not dizzy.  Notified Claiborne Billings, NP regarding situation.  New orders for 500 cc bolus received and administered.  BP rechecked again and was 77/44.  Again, patient said she did not feel dizzy or lightheaded.  Paged Claiborne Billings, NP, and orders received to administer another 500 cc bolus.  Will recheck BP after bolus is finished.  Will continue to monitor.

## 2015-05-29 NOTE — Progress Notes (Signed)
ANTIBIOTIC CONSULT NOTE - INITIAL  Pharmacy Consult for Vancomycin / adding Fortaz for Sepsis Indication: Cellulitis / sepsis  Allergies  Allergen Reactions  . Food Rash    "lasagna"= rash from the noodles  . Penicillins Hives    Per MAR- unable to ask quantification questions Can tolerate cephalosporins  . Sulfamethoxazole-Trimethoprim Hives and Nausea And Vomiting    Patient Measurements: Height: 5\' 2"  (157.5 cm) Weight: (!) 409 lb (185.521 kg) IBW/kg (Calculated) : 50.1  Vital Signs: Temp: 98.6 F (37 C) (12/09 0518) Temp Source: Oral (12/09 0518) BP: 99/53 mmHg (12/09 0518) Pulse Rate: 85 (12/09 0518)  Labs:  Recent Labs  05/28/15 1948 05/29/15 0453  WBC 6.4 6.0  HGB 10.6* 8.7*  PLT 257 214  CREATININE 2.05* 1.90*   Estimated Creatinine Clearance: 50.5 mL/min (by C-G formula based on Cr of 1.9).   Microbiology:   Medical History: Past Medical History  Diagnosis Date  . Bipolar disorder (HCC)   . Schizoaffective disorder (HCC)   . Mood disorder (HCC)   . Epilepsy (HCC)   . Thyroid disease   . Hiatal hernia   . Atrial flutter (HCC)   . Schizoaffective disorder   . Urinary tract infection   . Hypertension   . GERD (gastroesophageal reflux disease)   . Hypothyroidism   . Obesity   . Asthma   . COPD (chronic obstructive pulmonary disease) (HCC)   . CHF (congestive heart failure) (HCC)   . Seizures (HCC)   . Morbid obesity (HCC)   . Chronic pain   . Hypercholesteremia   . Anginal pain (HCC)   . Myocardial infarction (HCC) 2002  . Diabetes mellitus     "I'm not diabetic anymore" (05/23/2012)  . Anemia   . History of blood transfusion 1993    "when I had my hysterectomy" (05/23/2012)  . H/O hiatal hernia   . Daily headache   . Arthritis     "severe; all over my body" (05/23/2012)  . Hypoventilation syndrome     [name] 05/30/2012  . Atrial fibrillation (HCC)   . Atrial flutter, paroxysmal (HCC)     [name] 05/30/2012  . Exertional dyspnea   .  Shortness of breath     "all the time lately" (05/30/2012)  . Pneumonia     "several times; including now" (04/23/2013)  . Anxiety   . Episodic mood disorder (HCC)   . Psychosis   . Schizophrenia (HCC)   . 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 CHOLELITHIASIS 11/30/2009    Qualifier: Diagnosis of  By: Brynda Rim     Assessment: 64 y/o F from nursing facility with hypoxia, pt started on IV  Vancomycin this AM per pharmacy for cellulitis/multiple bed sores, recently completed course of keflex for UTI, was also treated for cellulitis during recent admit.  Now adding IV Ceftazidime to therapy for Sepsis indication.   WBC WNL, in acute renal failure. Scr slight decrease to 1.90  with normalized CrCl of ~57ml/min.  -Vancomycin dosing will be challenging with morbid obesity/renal failure.   Goal of Therapy:  Vancomycin trough level 15-20 mcg/ml  Plan:  Give Ceftazidime 2 g IV q12h  Continue Vancomycin 1750 mg IV q24h -Dose may need change if renal function improves -Trend WBC, temp, renal function -Vancomycin trough levels as indicated at steady state.   Noah Delaine, RPh Clinical Pharmacist Pager: [phone] 05/29/2015,10:00 AM

## 2015-05-29 NOTE — Progress Notes (Signed)
ANTIBIOTIC CONSULT NOTE - INITIAL  Pharmacy Consult for Vancomycin  Indication: Cellulitis  Allergies  Allergen Reactions  . Food Rash    "lasagna"= rash from the noodles  . Penicillins Hives    Per MAR- unable to ask quantification questions Can tolerate cephalosporins  . Sulfamethoxazole-Trimethoprim Hives and Nausea And Vomiting    Patient Measurements: Height: 5\' 2"  (157.5 cm) Weight: (!) 409 lb (185.521 kg) IBW/kg (Calculated) : 50.1  Vital Signs: Temp: 98.4 F (36.9 C) (12/09 0202) Temp Source: Oral (12/09 0202) BP: 119/39 mmHg (12/09 0202) Pulse Rate: 88 (12/09 0202)  Labs:  Recent Labs  05/28/15 1948  WBC 6.4  HGB 10.6*  PLT 257  CREATININE 2.05*   Estimated Creatinine Clearance: 46.9 mL/min (by C-G formula based on Cr of 2.05).   Microbiology:   Medical History: Past Medical History  Diagnosis Date  . Bipolar disorder (HCC)   . Schizoaffective disorder (HCC)   . Mood disorder (HCC)   . Epilepsy (HCC)   . Thyroid disease   . Hiatal hernia   . Atrial flutter (HCC)   . Schizoaffective disorder   . Urinary tract infection   . Hypertension   . GERD (gastroesophageal reflux disease)   . Hypothyroidism   . Obesity   . Asthma   . COPD (chronic obstructive pulmonary disease) (HCC)   . CHF (congestive heart failure) (HCC)   . Seizures (HCC)   . Morbid obesity (HCC)   . Chronic pain   . Hypercholesteremia   . Anginal pain (HCC)   . Myocardial infarction (HCC) 2002  . Diabetes mellitus     "I'm not diabetic anymore" (05/23/2012)  . Anemia   . History of blood transfusion 1993    "when I had my hysterectomy" (05/23/2012)  . H/O hiatal hernia   . Daily headache   . Arthritis     "severe; all over my body" (05/23/2012)  . Hypoventilation syndrome     [name] 05/30/2012  . Atrial fibrillation (HCC)   . Atrial flutter, paroxysmal (HCC)     [name] 05/30/2012  . Exertional dyspnea   . Shortness of breath     "all the time lately" (05/30/2012)  .  Pneumonia     "several times; including now" (04/23/2013)  . Anxiety   . Episodic mood disorder (HCC)   . Psychosis   . Schizophrenia (HCC)   . 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 CHOLELITHIASIS 11/30/2009    Qualifier: Diagnosis of  By: Brynda Rim     Assessment: 64 y/o F from nursing facility with hypoxia, pt to start vancomycin per pharmacy for cellulitis/multiple bed sores, recently completed course of keflex for UTI, was also treated for cellulitis during recent admit, WBC WNL, in acute renal failure with normalized CrCl of ~32, other labs reviewed. Vancomycin dosing will be challenging with morbid obesity/renal failure.   Goal of Therapy:  Vancomycin trough level 15-20 mcg/ml  Plan:  -Vancomycin 1750 mg IV q24h, dose may need changed if renal function improves -Trend WBC, temp, renal function -Drug levels as indicated   Abran Duke 05/29/2015,2:13 AM

## 2015-05-29 NOTE — NC FL2 (Signed)
[place] MEDICAID FL2 LEVEL OF CARE SCREENING TOOL     IDENTIFICATION  Patient Name: [name] Birthdate: [birth year] Sex: female Admission Date (Current Location): 05/28/2015  [place] and [place] Number: Haynes Bast [id number] P Facility and Address:  [facility], [address] [place], [place] [address]      Provider Number: [id number]  Attending Physician Name and Address:  Catarina Hartshorn, MD  Relative Name and Phone Number:  Kurtis Bushman - [phone]    Current Level of Care: Hospital Recommended Level of Care: Nursing Facility Prior Approval Number:    Date Approved/Denied:   PASRR Number: [id number] B Dolores Hoose. 07/03/13)  Discharge Plan: SNF    Current Diagnoses: Patient Active Problem List   Diagnosis Date Noted  . Acute on chronic respiratory failure (HCC) 05/29/2015  . Schizoaffective disorder (HCC) 05/29/2015  . Cellulitis and abscess of trunk 05/29/2015  . Acute kidney injury (HCC)   . SIRS (systemic inflammatory response syndrome) (HCC)   . AKI (acute kidney injury) (HCC) 05/28/2015  . Sepsis (HCC) 05/28/2015  . Fluid overload 05/21/2015  . Sepsis due to Escherichia coli (E. coli) (HCC) 05/21/2015  . CHF with unknown LVEF (HCC)   . Pressure ulcer 05/19/2015  . Pickwickian syndrome (HCC) 05/19/2015  . Acute and chronic respiratory failure with hypercapnia (HCC) 05/14/2015  . Septic shock (HCC) 05/14/2015  . Acute respiratory failure (HCC) 05/14/2015  . Cellulitis of right breast 05/13/2015  . Acute renal failure (HCC) 05/13/2015  . Hyperkalemia 05/13/2015  . Hyponatremia 05/13/2015  . Acute encephalopathy 05/13/2015  . Benign hypertensive heart disease without heart failure 01/01/2015  . Schizoaffective disorder, bipolar type (HCC) 08/07/2014  . COPD (chronic obstructive pulmonary disease) (HCC) 04/19/2013  . Hypercapnic respiratory failure, chronic (HCC) 09/02/2012  . Atrial fibrillation (HCC) 03/28/2012  .  Hypothyroidism 03/28/2012  . Diabetes mellitus (HCC) 03/28/2012  . Morbid obesity (HCC)   . Obstructive sleep apnea 01/22/2010  . LOW BACK PAIN SYNDROME 12/11/2007  . GERD 02/06/2007  . TARDIVE DYSKINESIA 08/10/2004    Orientation RESPIRATION BLADDER Height & Weight     (Patient disoriented x4)  O2 (3 Liters oxygen) Continent   409 lbs.  BEHAVIORAL SYMPTOMS/MOOD NEUROLOGICAL BOWEL NUTRITION STATUS      Continent Diet (Carb modified)  AMBULATORY STATUS COMMUNICATION OF NEEDS Skin   Extensive Assist Verbally PU Stage and Appropriate Care (Numerous Stage 2 pressure ulcers to left back, mid back. lower back, right posterior arm, right upper back, right back, upper back.)                       Personal Care Assistance Level of Assistance  Bathing, Feeding Bathing Assistance: Maximum assistance Feeding assistance: Independent Dressing Assistance: Maximum assistance     Functional Limitations Info    Sight Info: Adequate Hearing Info: Adequate Speech Info: Adequate    SPECIAL CARE FACTORS FREQUENCY                       Contractures Contractures Info: Not present    Additional Factors Info  Insulin Sliding Scale, Code Status, Allergies Code Status Info: Full code Allergies Info: Lasagna noodles, Penicillins, Sulfamethoxazole, Trimethoprim   Insulin Sliding Scale Info: 6 times per day       Current Medications (05/29/2015):  This is the current hospital active medication list Current Facility-Administered Medications  Medication Dose Route Frequency Provider Last Rate Last Dose  . 0.9 %  sodium chloride infusion  1,000  mL Intravenous Continuous Derwood Kaplan, MD 125 mL/hr at 05/29/15 0054 1,000 mL at 05/29/15 0054  . acetaminophen (TYLENOL) tablet 650 mg  650 mg Oral Q6H PRN Clydie Braun, MD       Or  . acetaminophen (TYLENOL) suppository 650 mg  650 mg Rectal Q6H PRN Clydie Braun, MD      . albuterol (PROVENTIL) (2.5 MG/3ML) 0.083% nebulizer solution  2.5 mg  2.5 mg Nebulization Q2H PRN Clydie Braun, MD      . aspirin chewable tablet 81 mg  81 mg Oral Daily Clydie Braun, MD   81 mg at 05/29/15 1024  . bisacodyl (DULCOLAX) suppository 10 mg  10 mg Rectal Q72H PRN Clydie Braun, MD      . cefTAZidime (FORTAZ) 2 g in dextrose 5 % 50 mL IVPB  2 g Intravenous Q12H Tamera Reason, RPH   2 g at 05/29/15 1222  . chlorproMAZINE (THORAZINE) tablet 100 mg  100 mg Oral Daily Clydie Braun, MD   100 mg at 05/29/15 0317  . chlorproMAZINE (THORAZINE) tablet 50 mg  50 mg Oral 2 times per day Clydie Braun, MD   50 mg at 05/29/15 1026  . collagenase (SANTYL) ointment   Topical Daily Catarina Hartshorn, MD      . diphenhydrAMINE (BENADRYL) capsule 25 mg  25 mg Oral Q8H PRN Clydie Braun, MD      . divalproex (DEPAKOTE ER) 24 hr tablet 1,250 mg  1,250 mg Oral QHS Clydie Braun, MD   1,250 mg at 05/29/15 0318  . feeding supplement (PRO-STAT SUGAR FREE 64) liquid 30 mL  30 mL Oral TID Clydie Braun, MD   30 mL at 05/29/15 1025  . heparin injection 5,000 Units  5,000 Units Subcutaneous 3 times per day Clydie Braun, MD   5,000 Units at 05/29/15 0609  . insulin aspart (novoLOG) injection 0-9 Units  0-9 Units Subcutaneous 6 times per day Clydie Braun, MD   0 Units at 05/29/15 0301  . ipratropium-albuterol (DUONEB) 0.5-2.5 (3) MG/3ML nebulizer solution 3 mL  3 mL Nebulization Q6H Rondell A Katrinka Blazing, MD   3 mL at 05/29/15 0800  . lamoTRIgine (LAMICTAL) tablet 100 mg  100 mg Oral BID Clydie Braun, MD   100 mg at 05/29/15 1024  . levothyroxine (SYNTHROID, LEVOTHROID) tablet 150 mcg  150 mcg Oral QAC breakfast Clydie Braun, MD   150 mcg at 05/29/15 1024  . LORazepam (ATIVAN) tablet 0.5 mg  0.5 mg Oral TID PRN Clydie Braun, MD      . nicotine (NICODERM CQ - dosed in mg/24 hours) patch 21 mg  21 mg Transdermal Daily Clydie Braun, MD   21 mg at 05/29/15 1025  . pantoprazole (PROTONIX) EC tablet 40 mg  40 mg Oral Daily Clydie Braun, MD   40 mg at 05/29/15  1024  . sodium chloride 0.9 % injection 3 mL  3 mL Intravenous Q12H Rondell Burtis Junes, MD   3 mL at 05/29/15 1000  . vancomycin (VANCOCIN) 1,750 mg in sodium chloride 0.9 % 500 mL IVPB  1,750 mg Intravenous Q24H Clydie Braun, MD   1,750 mg at 05/29/15 0418     Discharge Medications: Please see discharge summary for a list of discharge medications.  Relevant Imaging Results:  Relevant Lab Results:   Additional Information    Okey Dupre, Lazaro Arms, LCSW

## 2015-05-29 NOTE — Consult Note (Signed)
WOC wound consult note Reason for Consult: "multiple pressure ulcers" Wound type: Unknown etiology, not chronic as my partner just saw the patient in Nov. 25th 2016 and they were not present.  Full thickness ulcerations, not pressure. Present over the right upper back, shoulder, right upper side. She is not able to tell me any history.  All of the wounds are punched out and draining yellow, some thick drainage. No odor.  All 17 wounds are 100% filled with yellow slough, the largest 4cm x 4cmx 0.5cm has some yellow/soft black tissue but again 100% of the wound bed.  Most wound average 1cmx 1cmx 0.3cm however some are deeper than others.  Patient is very agitated with turning and a bit resistant, therefore I was not able to measure all 17 sites. Not in areas of pressure points/not over bony prominences.  Not in areas of sheer and do not present like sheer injury.  She has intact skin under the pannus and in the back skin folds.  She did have one area of MASD on the right buttock that I visualized and it is healed at this time.  Pressure Ulcer POA: No Dressing procedure/placement/frequency: Air mattress in place which is a good thing with the heavy drainage from the wounds.  Will add enzymatic debridement for the wounds since they are all necrotic, however I did notify the MD that the etiology of these wound is unclear, would recommend follow up with dermatologist and/or punch biopsy to possibly determine etiology.   Concerned for the quick presentation of these wounds.    Discussed POC with bedside nurse.  Re consult if needed, will not follow at this time. Thanks  Ameila Weldon [employer], CWOCN [phone])

## 2015-05-29 NOTE — Care Management Note (Signed)
Case Management Note  Patient Details  Name: JAYA TOP MRN: [id number] Date of Birth: 1951/04/16  Subjective/Objective:         CM following for progression and d/c planning.           Action/Plan: 05/29/2015 Noted referral for LTAC , however this pt is a SNF resident and LTAC would not be an appropriate venue for this pt . This most likely was ordered in error. Will follow this pt closely to make sure she is to return to her SNF. Will work with CSW for any changes.   Expected Discharge Date:                  Expected Discharge Plan:  Skilled Nursing Facility  In-House Referral:  Clinical Social Work  Discharge planning Services  CM Consult  Post Acute Care Choice:    Choice offered to:     DME Arranged:    DME Agency:     HH Arranged:    HH Agency:     Status of Service:  In process, will continue to follow  Medicare Important Message Given:    Date Medicare IM Given:    Medicare IM give by:    Date Additional Medicare IM Given:    Additional Medicare Important Message give by:     If discussed at Long Length of Stay Meetings, dates discussed:    Additional Comments:  Starlyn Skeans, RN 05/29/2015, 11:19 AM

## 2015-05-29 NOTE — Clinical Social Work Note (Signed)
Clinical Social Work Assessment  Patient Details  Name: [name] MRN: [id number] Date of Birth: 1950-09-14  Date of referral:  05/29/15               Reason for consult:  Facility Placement                Permission sought to share information with:  Other (No - Patient disoriented x4) Permission granted to share information::  No (CSW talked with patient's friend, who is employed at [employer])  Name::     [name]  Agency::     Relationship::  Friend  Contact Information:  [phone]  Housing/Transportation Living arrangements for the past 2 months:  Skilled Nursing Facility ([facility]) Source of Information:  Friend/Neighbor (Debbie Advertising copywriter, Publishing copy at [employer] and friend of patient) Patient Interpreter Needed:  None Criminal Activity/Legal Involvement Pertinent to Current Situation/Hospitalization:  No - Comment as needed Significant Relationships:  Friend Lives with:  Facility Resident [facility]) Do you feel safe going back to the place where you live?  Yes Need for family participation in patient care:  Yes (Comment) (Friend [name])  Care giving concerns:  None expressed by patient's friend, [name]   Social Worker assessment / plan:  CSW talked with Ms. Reynolds, Publishing copy at [facility] [employer] and friend of patient regarding d/c plans. Per Ms. Reynolds, patient will return to [facility], where she has been a patient for approx. 3 years. Ms. Thad Ranger explained that patient has tried to live independently at home, but has not been successful.  Employment status:  Disabled (Comment on whether or not currently receiving Disability) Insurance information:  Medicare, Medicaid [place] PT Recommendations:  Skilled Nursing Facility Information / Referral to community resources:  Other (Comment Required) (None needed or requested at this time)  Patient/Family's Response to care:  No concerns expressed  regarding patient's care during hospitalization.  Patient/Family's Understanding of and Emotional Response to Diagnosis, Current Treatment, and Prognosis:  Not discussed.  Emotional Assessment Appearance:  Other (Comment Required (Did not visit room, talked with friend via phone) Attitude/Demeanor/Rapport:  Unable to Assess Affect (typically observed):  Unable to Assess Orientation:   (Disoriented x4 per nurse) Alcohol / Substance use:  Tobacco Use (Patient reports that she quit smoking and does not consume alcohol or use illicit drugs.) Psych involvement (Current and /or in the community):  No (Comment)  Discharge Needs  Concerns to be addressed:  Discharge Planning Concerns Readmission within the last 30 days:  Yes Current discharge risk:  None Barriers to Discharge:  No Barriers Identified   Cristobal Goldmann, LCSW 05/29/2015, 1:59 PM

## 2015-05-29 NOTE — Procedures (Signed)
Pt placed on CPAP with auto mode setting.  (Max-20 & min-5).

## 2015-05-29 NOTE — Progress Notes (Signed)
PROGRESS NOTE  [name] WUJ:[id number] DOB: 1950-12-12 DOA: 05/28/2015 PCP: Margit Hanks, MD  Brief History 64 year old female with a history of mental retardation, schizoaffective disorder, OHS, bedbound, atrial fibrillation, epilepsy, and chronic respiratory failure presented from her nursing facility secondary to hypoxia and fever. Unfortunately, the patient is a poor historian. Initial workup revealed acute kidney injury with serum creatinine 2.05 with fever of 100.79F. She was noted to be hypoxic upon presentation with oxygen saturation in the mid 80s. The patient was admitted for intravenous antibiotics and infection workup.  Notably, the patient was recently discharged from [facility] after a stay from 05/13/2015 through 05/21/2015 for acute on chronic respiratory failure requiring intubation and sepsis secondary to UTI and breast cellulitis Assessment/Plan: Fever/cellulitis of the back/infected friction ulcers -Blood cultures 2 sets -Consult wound care nurse -Continue vancomycin -Add ceftazidime pending culture dat -Air mattress -Urinalysis negative for pyuria -Chest x-ray negative for infiltrates   Acute on chronic respiratory failure -Patient is stable on 3 L nasal cannula without lesion saturation 93-94% -Multifactorial including COPD/OHS -Aerosolized albuterol and Atrovent  -Wean oxygen for saturation greater than 92% -night time CPAP  Acute kidney injury -Baseline creatinine 0.7-1.0 -Continue intravenous fluids--> and creatinine improving -Suspect sepsis as etiology -Hold lisinopril and furosemide -Awaiting renal ultrasound  Chronic diastolic CHF -05/14/2015 echo EF 50-55%, PA P 40 -Difficult to assess fluid status given the patient's body habitus -Daily weights -05/21/2015 discharge weight 415 pounds   Atrial fibrillation history  -Presently in sinus rhythm  -CHADSVASc 2-3 -continue ASA   schizoaffective disorder/bipolar -Continue Depakote,  Thorazine  Impaired glucose tolerance -05/14/2015 hemoglobin A1c 5.9  Hypothyroidism -Continue Synthroid  Leg pain and edema -Venous duplex r/o DVT  Morbid obesity ->400 pounds and BMI of 72   Family Communication:   Pt at beside Disposition Plan:   SNF 2-3 days if medically stable      Procedures/Studies: Dg Chest Port 1 View  05/28/2015  CLINICAL DATA:  Hypoxia, decreased O2 sats. EXAM: PORTABLE CHEST 1 VIEW COMPARISON:  05/17/2015. FINDINGS: Trachea is midline given patient rotation. Heart is enlarged, stable. Lungs are somewhat low in volume with probable vascular crowding. No definite pleural fluid. IMPRESSION: Low lung volumes with probable vascular crowding. No definite edema. Electronically Signed   By: Leanna Battles M.D.   On: 05/28/2015 20:02   Dg Chest Port 1 View  05/17/2015  CLINICAL DATA:  Endotracheal tube.  COPD.  Asthma EXAM: PORTABLE CHEST 1 VIEW COMPARISON:  05/16/2015 FINDINGS: Interval extubation and removal of NG tube. Stable enlarged cardiac silhouette. There is mild increase in fine airspace disease in the upper lobes. Low lung volumes. No increase in atelectasis. LEFT central venous line unchanged. IMPRESSION: 1. Stable lung volumes following extubation. 2. Slight increase in upper lobe airspace disease suggests mild pulmonary edema. Electronically Signed   By: Genevive Bi M.D.   On: 05/17/2015 07:13   Dg Chest Port 1 View  05/16/2015  CLINICAL DATA:  64 year old female currently intubated EXAM: PORTABLE CHEST 1 VIEW COMPARISON:  Prior chest x-ray 05/15/2015 FINDINGS: The tip of the endotracheal tube is 2.4 cm above the carina. A nasogastric tube is present and is coiled within the gastric body. Left subclavian approach central venous catheter remains in unchanged position with the tip overlying the superior cavoatrial junction. Persistent low inspiratory volumes with bibasilar atelectasis versus infiltrate. Stable mild cardiomegaly. Slight blurring of  the interstitial markings likely secondary to patient motion. Overall,  no significant interval change in the appearance of the chest. No acute osseous abnormality. IMPRESSION: 1. The tip the endotracheal tube is 2.4 cm above the carina. Other support apparatus also in satisfactory and unchanged position. 2. No significant interval change in the appearance of the lungs with persistent low inspiratory volumes and bibasilar atelectasis versus infiltrate. 3. Unchanged borderline cardiomegaly. Electronically Signed   By: Malachy Moan M.D.   On: 05/16/2015 09:29   Portable Chest Xray  05/15/2015  CLINICAL DATA:  Intubation. EXAM: PORTABLE CHEST 1 VIEW COMPARISON:  05/14/2015. FINDINGS: Endotracheal tube 3 cm above the carina. Left subclavian central line stable position. NG tube in stable position. Mediastinum and hilar structures are unremarkable. Cardiomegaly with normal pulmonary vascularity. Bibasilar subsegmental atelectasis and/or infiltrates. Small right pleural effusion cannot be excluded. No pneumothorax . IMPRESSION: 1. Endotracheal tube 3 cm above the carina. Remaining lines and tubes in stable position. 2. Bibasilar subsegmental atelectasis and/or mild infiltrates. 3. Stable cardiomegaly . Electronically Signed   By: Maisie Fus  Register   On: 05/15/2015 07:06   Dg Chest Port 1 View  05/14/2015  CLINICAL DATA:  Central line and orogastric tube placement. Altered mental status. Initial encounter. EXAM: PORTABLE CHEST 1 VIEW COMPARISON:  Chest radiograph performed 05/13/2015 FINDINGS: The patient's endotracheal tube is seen ending just above the carina. This should be retracted 2-3 cm. An enteric tube is noted extending below the diaphragm. Patchy bilateral airspace opacification may reflect atelectasis. No definite pleural effusion or pneumothorax is seen. The cardiomediastinal silhouette is enlarged. No acute osseous abnormalities are identified. IMPRESSION: 1. Endotracheal tube seen ending just above  the carina. This should be retracted 2-3 cm. 2. Patchy bilateral airspace opacification may reflect atelectasis. Cardiomegaly noted. Electronically Signed   By: Roanna Raider M.D.   On: 05/14/2015 06:24   Dg Chest Port 1 View  05/13/2015  CLINICAL DATA:  Hypoxia. Diabetes. Shortness of breath. Former smoker for 14 years. EXAM: PORTABLE CHEST 1 VIEW COMPARISON:  05/23/2013 FINDINGS: Shallow inspiration. Cardiac enlargement without significant vascular congestion. Mild volume loss in the right upper lung is probably due to shallow inspiration. No focal consolidation or airspace disease is appreciated. No blunting of costophrenic angles. No pneumothorax. Mediastinal contours appear intact. IMPRESSION: Cardiac enlargement.  Shallow inspiration.  No focal consolidation. Electronically Signed   By: Burman Nieves M.D.   On: 05/13/2015 20:06         Subjective: Patient denies fevers, chills, headache, chest pain, dyspnea, nausea, vomiting.she states that she "hurts all over"    Objective: Filed Vitals:   05/29/15 0202 05/29/15 0330 05/29/15 0350 05/29/15 0518  BP: 119/39   99/53  Pulse: 88   85  Temp: 98.4 F (36.9 C)   98.6 F (37 C)  TempSrc: Oral   Oral  Resp: 23   20  Height:      Weight:      SpO2: 90% 88% 94% 92%    Intake/Output Summary (Last 24 hours) at 05/29/15 0825 Last data filed at 05/29/15 4975  Gross per 24 hour  Intake 948.33 ml  Output      0 ml  Net 948.33 ml   Weight change:  Exam:   General:  Pt is alert, follows commands appropriately, not in acute distress  HEENT: No icterus, No thrush, No neck mass, [place]/AT  Cardiovascular: RRR, S1/S2, no rubs, no gallops  Respiratory: diminished breath sounds at the bases heard no wheezing. Good air movement.   Abdomen: Soft/+BS, non tender, non distended,  no guarding; difficult to assess hepatosplenomegaly secondary to body habitus   Extremities: 1+ LE edema, No lymphangitis, No petechiae, No rashes, no  synovitis  Data Reviewed: Basic Metabolic Panel:  Recent Labs Lab 05/28/15 1948 05/29/15 0453  NA 142 139  K 3.4* 3.5  CL 96* 98*  CO2 35* 36*  GLUCOSE 100* 91  BUN 6 8  CREATININE 2.05* 1.90*  CALCIUM 9.2 8.1*  MG  --  1.6*  PHOS  --  4.7*   Liver Function Tests:  Recent Labs Lab 05/28/15 1948  AST 14*  ALT 9*  ALKPHOS 55  BILITOT 0.4  PROT 6.2*  ALBUMIN 2.9*   No results for input(s): LIPASE, AMYLASE in the last 168 hours. No results for input(s): AMMONIA in the last 168 hours. CBC:  Recent Labs Lab 05/28/15 1948 05/29/15 0453  WBC 6.4 6.0  NEUTROABS 3.7  --   HGB 10.6* 8.7*  HCT 35.9* 29.6*  MCV 100.6* 101.7*  PLT 257 214   Cardiac Enzymes: No results for input(s): CKTOTAL, CKMB, CKMBINDEX, TROPONINI in the last 168 hours. BNP: Invalid input(s): POCBNP CBG:  Recent Labs Lab 05/29/15 0200 05/29/15 0515 05/29/15 0731  GLUCAP 83 100* 92    Recent Results (from the past 240 hour(s))  Urine culture     Status: None (Preliminary result)   Collection Time: 05/28/15  8:47 PM  Result Value Ref Range Status   Specimen Description URINE, CATHETERIZED  Final   Special Requests NONE  Final   Culture NO GROWTH < 12 HOURS  Final   Report Status PENDING  Incomplete  MRSA PCR Screening     Status: None   Collection Time: 05/29/15  2:32 AM  Result Value Ref Range Status   MRSA by PCR NEGATIVE NEGATIVE Final    Comment:        The GeneXpert MRSA Assay (FDA approved for NASAL specimens only), is one component of a comprehensive MRSA colonization surveillance program. It is not intended to diagnose MRSA infection nor to guide or monitor treatment for MRSA infections.      Scheduled Meds: . aspirin  81 mg Oral Daily  . chlorproMAZINE  100 mg Oral Daily  . chlorproMAZINE  50 mg Oral 2 times per day  . divalproex  1,250 mg Oral QHS  . feeding supplement (PRO-STAT SUGAR FREE 64)  30 mL Oral TID  . heparin  5,000 Units Subcutaneous 3 times per day   . insulin aspart  0-9 Units Subcutaneous 6 times per day  . ipratropium-albuterol  3 mL Nebulization Q6H  . lamoTRIgine  100 mg Oral BID  . levothyroxine  150 mcg Oral QAC breakfast  . nicotine  21 mg Transdermal Daily  . pantoprazole  40 mg Oral Daily  . sodium chloride  3 mL Intravenous Q12H  . vancomycin  1,750 mg Intravenous Q24H   Continuous Infusions: . sodium chloride 1,000 mL (05/29/15 0054)     Kenyatte Chatmon, DO  [facility] Pager [phone]  If 7PM-7AM, please contact night-coverage [email or web address] Password [email or web address] 05/29/2015, 8:25 AM   LOS: 1 day

## 2015-05-29 NOTE — Progress Notes (Addendum)
Late entry:  New Admission Note:   Arrival: From ED with RN Mental Orientation: Disoriented to self, place, time, and situation Telemetry: Placed on box 6e23, NSR Assessment:  See doc flowsheet Skin: Multiple pressure wounds on back.  Placed foam dressings on wounds.  Patient on air mattress.  Bottom and heels intact and blanchable.  IV: Right AC IV Pain: None Safety Measures:  Call bell placed within reach; patient instructed on use of call bell and verbalized understanding. Bariatric bed in lowest position.  Yellow bracelet on.  Bed alarm on. [address] Orientation: Attempted to orient patient to staff, room, and unit. Family: None  Orders have been reviewed and implemented. Patient can be physically aggressive at times and has attempted to hit nursing staff.  However, she is resting calmly now.  Will continue to monitor.  Rozann Lesches, RN, BSN

## 2015-05-30 ENCOUNTER — Inpatient Hospital Stay (HOSPITAL_COMMUNITY): Payer: Medicare Other

## 2015-05-30 ENCOUNTER — Encounter (HOSPITAL_COMMUNITY): Payer: Self-pay

## 2015-05-30 LAB — BLOOD GAS, ARTERIAL
Acid-Base Excess: 5.1 mmol/L — ABNORMAL HIGH (ref 0.0–2.0)
Acid-Base Excess: 5.1 mmol/L — ABNORMAL HIGH (ref 0.0–2.0)
BICARBONATE: 32.2 meq/L — AB (ref 20.0–24.0)
Bicarbonate: 31.5 mEq/L — ABNORMAL HIGH (ref 20.0–24.0)
Drawn by: 246861
INSPIRATORY PAP: 15
O2 CONTENT: 4 L/min
O2 Content: 3 L/min
O2 SAT: 95.2 %
O2 Saturation: 86.5 %
PCO2 ART: 80.3 mmHg — AB (ref 35.0–45.0)
PH ART: 7.227 — AB (ref 7.350–7.450)
PH ART: 7.276 — AB (ref 7.350–7.450)
Patient temperature: 98.6
Patient temperature: 98.6
TCO2: 34.7 mmol/L (ref 0–100)
TCO2: 33.7 mmol/L (ref 0–100)
pCO2 arterial: 70.1 mmHg (ref 35.0–45.0)
pO2, Arterial: 58.2 mmHg — ABNORMAL LOW (ref 80.0–100.0)
pO2, Arterial: 79 mmHg — ABNORMAL LOW (ref 80.0–100.0)

## 2015-05-30 LAB — CBC
HCT: 31.9 % — ABNORMAL LOW (ref 36.0–46.0)
Hemoglobin: 9.1 g/dL — ABNORMAL LOW (ref 12.0–15.0)
MCH: 29.8 pg (ref 26.0–34.0)
MCHC: 28.5 g/dL — AB (ref 30.0–36.0)
MCV: 104.6 fL — AB (ref 78.0–100.0)
PLATELETS: 233 10*3/uL (ref 150–400)
RBC: 3.05 MIL/uL — ABNORMAL LOW (ref 3.87–5.11)
RDW: 19 % — AB (ref 11.5–15.5)
WBC: 6.1 10*3/uL (ref 4.0–10.5)

## 2015-05-30 LAB — BASIC METABOLIC PANEL
Anion gap: 6 (ref 5–15)
BUN: 5 mg/dL — AB (ref 6–20)
CALCIUM: 7.9 mg/dL — AB (ref 8.9–10.3)
CHLORIDE: 102 mmol/L (ref 101–111)
CO2: 32 mmol/L (ref 22–32)
CREATININE: 1.53 mg/dL — AB (ref 0.44–1.00)
GFR calc non Af Amer: 35 mL/min — ABNORMAL LOW (ref >60)
GFR, EST AFRICAN AMERICAN: 41 mL/min — AB (ref >60)
GLUCOSE: 87 mg/dL (ref 65–99)
Potassium: 3.9 mmol/L (ref 3.5–5.1)
Sodium: 140 mmol/L (ref 135–145)

## 2015-05-30 LAB — URINE CULTURE: Culture: >=100000

## 2015-05-30 LAB — GLUCOSE, CAPILLARY
GLUCOSE-CAPILLARY: 103 mg/dL — AB (ref 65–99)
GLUCOSE-CAPILLARY: 95 mg/dL (ref 65–99)
GLUCOSE-CAPILLARY: 95 mg/dL (ref 65–99)
Glucose-Capillary: 86 mg/dL (ref 65–99)

## 2015-05-30 LAB — AMMONIA: AMMONIA: 176 umol/L — AB (ref 9–35)

## 2015-05-30 LAB — TSH: TSH: 3.804 u[IU]/mL (ref 0.350–4.500)

## 2015-05-30 LAB — VALPROIC ACID LEVEL: VALPROIC ACID LVL: 53 ug/mL (ref 50.0–100.0)

## 2015-05-30 LAB — SODIUM, URINE, RANDOM: SODIUM UR: 18 mmol/L

## 2015-05-30 LAB — CREATININE, URINE, RANDOM: Creatinine, Urine: 286.49 mg/dL

## 2015-05-30 LAB — VITAMIN B12: Vitamin B-12: 1129 pg/mL — ABNORMAL HIGH (ref 180–914)

## 2015-05-30 MED ORDER — CHLORHEXIDINE GLUCONATE 0.12 % MT SOLN
15.0000 mL | Freq: Two times a day (BID) | OROMUCOSAL | Status: DC
  Administered 2015-05-30 – 2015-06-04 (×11): 15 mL via OROMUCOSAL
  Filled 2015-05-30 (×11): qty 15

## 2015-05-30 MED ORDER — HALOPERIDOL LACTATE 5 MG/ML IJ SOLN: 5.0000 mg | Freq: Four times a day (QID) | INTRAMUSCULAR | Status: DC | PRN

## 2015-05-30 MED ORDER — LACTULOSE 10 GM/15ML PO SOLN
20.0000 g | Freq: Three times a day (TID) | ORAL | Status: DC
  Administered 2015-05-30 – 2015-06-04 (×10): 20 g via ORAL
  Filled 2015-05-30 (×14): qty 30

## 2015-05-30 MED ORDER — SODIUM CHLORIDE 0.9 % IV BOLUS (SEPSIS)
500.0000 mL | Freq: Once | INTRAVENOUS | Status: AC
  Administered 2015-05-30: 500 mL via INTRAVENOUS

## 2015-05-30 MED ORDER — CETYLPYRIDINIUM CHLORIDE 0.05 % MT LIQD
7.0000 mL | Freq: Two times a day (BID) | OROMUCOSAL | Status: DC
  Administered 2015-05-30 – 2015-06-04 (×7): 7 mL via OROMUCOSAL

## 2015-05-30 NOTE — Progress Notes (Signed)
Pt arrived to unit from 6E on BiPap. No visible distress noted. Pt oriented to person and place. VS within normal limits. Notified E-link and central telemetry. Will continue to monitor pt.

## 2015-05-30 NOTE — Progress Notes (Signed)
Reviewed ABG with respiratory therapy 7.22/80/58/34 (3L) -Asked respiratory therapy to place the patient back on CPAP for now -Repeat ABG later today -Obtain chest x-ray, although suspect this may be low yield given the patient's body habitus  DTat

## 2015-05-30 NOTE — Progress Notes (Signed)
BP 78/36 this AM.  Anna Genre, NP.  New orders received for 500 cc bolus.  Bolus administered.  BP rechecked and now 90/39.  Patient has now received a total of 2 liters this shift.  Will continue to monitor.

## 2015-05-30 NOTE — Progress Notes (Signed)
Patient requested we call friend to notify of transfer to stepdown. Clarance Maxwell, friend, listed as first to call. Clarance notified of transfer.

## 2015-05-30 NOTE — Progress Notes (Signed)
Bladder scan performed this morning, and 559 cc of urine found in bladder.  Notified Claiborne Billings, NP.  Orders received for one-time in-and-out catheter.  Explained procedure to patient.  550 cc of yellow urine returned.

## 2015-05-30 NOTE — Progress Notes (Addendum)
Pt tore CPAP off and is refusing to let staff put it back on. Pt very agitated and attempting to hit RN. Applied nasal cannula at 3L and patient is now alert and asking to eat lunch. Set patient up to eat and will attempt to reapply later. Will continue to monitor.

## 2015-05-30 NOTE — Progress Notes (Addendum)
PROGRESS NOTE  [name] ZOX:[id number] DOB: April 21, 1951 DOA: 05/28/2015 PCP: Margit Hanks, MD  Brief History 64 year old female with a history of mental retardation, schizoaffective disorder, OHS, bedbound, atrial fibrillation, epilepsy, and chronic respiratory failure presented from her nursing facility secondary to hypoxia and fever. Unfortunately, the patient is a poor historian. Initial workup revealed acute kidney injury with serum creatinine 2.05 with fever of 100.57F. She was noted to be hypoxic upon presentation with oxygen saturation in the mid 80s. The patient was admitted for intravenous antibiotics and infection workup. Notably, the patient was recently discharged from [facility] after a stay from 05/13/2015 through 05/21/2015 for acute on chronic respiratory failure requiring intubation and sepsis secondary to UTI and breast cellulitis Assessment/Plan: Fever/cellulitis of the back/infected friction ulcers -Blood cultures 2 sets--NGTD -Consult wound care nurse -Continue vancomycin -Add ceftazidime pending culture data -Air mattress -Urinalysis negative for pyuria -Chest x-ray negative for infiltrates  -05/30/15--vital signs at 1150--HR 92-RR18--96/42 -05/29/2015--Gen. surgery was consulted--felt the patient did not need debridement and recommended medical therapy with antibiotics and wound care  Acute on chronic respiratory failure -pt is normally on 2L [place] at SNF -Patient is stable on 3 L nasal cannula without lesion saturation 92-94% -Multifactorial including COPD/OHS -Aerosolized albuterol and Atrovent  -Wean oxygen for saturation greater than 92% -night time CPAP  Acute Encephalopathy -due to infection -Check ABG -Hold chlorpromazine for now -check B12 -check ammonia  Acute kidney injury -Baseline creatinine 0.7-1.0 -Continue intravenous fluids--> and creatinine improving -Suspect sepsis and volume depletion as etiology -Hold lisinopril and  furosemide -Awaiting renal ultrasound  Chronic diastolic CHF -05/14/2015 echo EF 50-55%, PAP 40 -Difficult to assess fluid status given the patient's body habitus -Daily weights -05/21/2015 discharge weight 415 pounds   Atrial fibrillation history  -Presently in sinus rhythm  -CHADSVASc 2-3 -continue ASA  schizoaffective disorder/bipolar -Continue Depakote, Thorazine -Check VPA level  Impaired glucose tolerance -05/14/2015 hemoglobin A1c 5.9 -Discontinue CBG checks -Discontinue insulin  Hypothyroidism -Continue Synthroid Check TSH-  Leg pain and edema -Venous duplex r/o DVT  Morbid obesity ->400 pounds and BMI of 72   Family Communication: Pt at beside Disposition Plan: SNF 2-3 days if medically stable    Procedures/Studies: US Renal  05/29/2015  CLINICAL DATA:  Acute kidney injury. EXAM: RENAL / URINARY TRACT ULTRASOUND COMPLETE COMPARISON:  06/20/2010 CT abdomen/ pelvis. 12/26/2009 abdominal sonogram. FINDINGS: Evaluation is significantly limited by patient related factors. Right Kidney: Length: 13.6 cm. Echogenicity within normal limits. No mass or hydronephrosis visualized. Left Kidney: Length: 12.6 cm. Echogenicity within normal limits. No mass or hydronephrosis visualized. Bladder: Appears normal for degree of bladder distention. IMPRESSION: Limited scan, with grossly normal kidneys and bladder. No hydronephrosis. Electronically Signed   By: Delbert Phenix M.D.   On: 05/29/2015 08:58   Dg Chest Port 1 View  05/28/2015  CLINICAL DATA:  Hypoxia, decreased O2 sats. EXAM: PORTABLE CHEST 1 VIEW COMPARISON:  05/17/2015. FINDINGS: Trachea is midline given patient rotation. Heart is enlarged, stable. Lungs are somewhat low in volume with probable vascular crowding. No definite pleural fluid. IMPRESSION: Low lung volumes with probable vascular crowding. No definite edema. Electronically Signed   By: Leanna Battles M.D.   On: 05/28/2015 20:02   Dg Chest Port 1  View  05/17/2015  CLINICAL DATA:  Endotracheal tube.  COPD.  Asthma EXAM: PORTABLE CHEST 1 VIEW COMPARISON:  05/16/2015 FINDINGS: Interval extubation and removal of NG tube. Stable enlarged cardiac silhouette. There  is mild increase in fine airspace disease in the upper lobes. Low lung volumes. No increase in atelectasis. LEFT central venous line unchanged. IMPRESSION: 1. Stable lung volumes following extubation. 2. Slight increase in upper lobe airspace disease suggests mild pulmonary edema. Electronically Signed   By: Genevive Bi M.D.   On: 05/17/2015 07:13   Dg Chest Port 1 View  05/16/2015  CLINICAL DATA:  64 year old female currently intubated EXAM: PORTABLE CHEST 1 VIEW COMPARISON:  Prior chest x-ray 05/15/2015 FINDINGS: The tip of the endotracheal tube is 2.4 cm above the carina. A nasogastric tube is present and is coiled within the gastric body. Left subclavian approach central venous catheter remains in unchanged position with the tip overlying the superior cavoatrial junction. Persistent low inspiratory volumes with bibasilar atelectasis versus infiltrate. Stable mild cardiomegaly. Slight blurring of the interstitial markings likely secondary to patient motion. Overall, no significant interval change in the appearance of the chest. No acute osseous abnormality. IMPRESSION: 1. The tip the endotracheal tube is 2.4 cm above the carina. Other support apparatus also in satisfactory and unchanged position. 2. No significant interval change in the appearance of the lungs with persistent low inspiratory volumes and bibasilar atelectasis versus infiltrate. 3. Unchanged borderline cardiomegaly. Electronically Signed   By: Malachy Moan M.D.   On: 05/16/2015 09:29   Portable Chest Xray  05/15/2015  CLINICAL DATA:  Intubation. EXAM: PORTABLE CHEST 1 VIEW COMPARISON:  05/14/2015. FINDINGS: Endotracheal tube 3 cm above the carina. Left subclavian central line stable position. NG tube in stable  position. Mediastinum and hilar structures are unremarkable. Cardiomegaly with normal pulmonary vascularity. Bibasilar subsegmental atelectasis and/or infiltrates. Small right pleural effusion cannot be excluded. No pneumothorax . IMPRESSION: 1. Endotracheal tube 3 cm above the carina. Remaining lines and tubes in stable position. 2. Bibasilar subsegmental atelectasis and/or mild infiltrates. 3. Stable cardiomegaly . Electronically Signed   By: Maisie Fus  Register   On: 05/15/2015 07:06   Dg Chest Port 1 View  05/14/2015  CLINICAL DATA:  Central line and orogastric tube placement. Altered mental status. Initial encounter. EXAM: PORTABLE CHEST 1 VIEW COMPARISON:  Chest radiograph performed 05/13/2015 FINDINGS: The patient's endotracheal tube is seen ending just above the carina. This should be retracted 2-3 cm. An enteric tube is noted extending below the diaphragm. Patchy bilateral airspace opacification may reflect atelectasis. No definite pleural effusion or pneumothorax is seen. The cardiomediastinal silhouette is enlarged. No acute osseous abnormalities are identified. IMPRESSION: 1. Endotracheal tube seen ending just above the carina. This should be retracted 2-3 cm. 2. Patchy bilateral airspace opacification may reflect atelectasis. Cardiomegaly noted. Electronically Signed   By: Roanna Raider M.D.   On: 05/14/2015 06:24   Dg Chest Port 1 View  05/13/2015  CLINICAL DATA:  Hypoxia. Diabetes. Shortness of breath. Former smoker for 14 years. EXAM: PORTABLE CHEST 1 VIEW COMPARISON:  05/23/2013 FINDINGS: Shallow inspiration. Cardiac enlargement without significant vascular congestion. Mild volume loss in the right upper lung is probably due to shallow inspiration. No focal consolidation or airspace disease is appreciated. No blunting of costophrenic angles. No pneumothorax. Mediastinal contours appear intact. IMPRESSION: Cardiac enlargement.  Shallow inspiration.  No focal consolidation. Electronically Signed    By: Burman Nieves M.D.   On: 05/13/2015 20:06         Subjective: Patient is somnolent but wakes up to voice. She denies any chest pain, shortness breath, abdominal pain. No reports of respiratory distress or vomiting. No diarrhea noted.   Objective: Filed Vitals:  05/30/15 0923 05/30/15 0933 05/30/15 1000 05/30/15 1127  BP:   103/58 84/30  Pulse:   86 89  Temp:   98.6 F (37 C)   TempSrc:   Oral   Resp:   18 18  Height:      Weight:      SpO2: 99% 97% 93% 95%    Intake/Output Summary (Last 24 hours) at 05/30/15 1154 Last data filed at 05/30/15 1100  Gross per 24 hour  Intake   6340 ml  Output    550 ml  Net   5790 ml   Weight change: -2.722 kg (-6 lb) Exam:   General:  Pt is alert, follows commands appropriately, not in acute distress  HEENT: No icterus, No thrush, No neck mass, [place]/AT  Cardiovascular: RRR, S1/S2, no rubs, no gallops  Respiratory: CTA bilaterally anterior  Abdomen: Soft/+BS, non tender, non distended, no guarding  Extremities: 2+LE edema, No lymphangitis, No petechiae, No rashes, no synovitis  Data Reviewed: Basic Metabolic Panel:  Recent Labs Lab 05/28/15 1948 05/29/15 0453 05/30/15 0600  NA 142 139 140  K 3.4* 3.5 3.9  CL 96* 98* 102  CO2 35* 36* 32  GLUCOSE 100* 91 87  BUN 6 8 5*  CREATININE 2.05* 1.90* 1.53*  CALCIUM 9.2 8.1* 7.9*  MG  --  1.6*  --   PHOS  --  4.7*  --    Liver Function Tests:  Recent Labs Lab 05/28/15 1948  AST 14*  ALT 9*  ALKPHOS 55  BILITOT 0.4  PROT 6.2*  ALBUMIN 2.9*   No results for input(s): LIPASE, AMYLASE in the last 168 hours. No results for input(s): AMMONIA in the last 168 hours. CBC:  Recent Labs Lab 05/28/15 1948 05/29/15 0453 05/30/15 0600  WBC 6.4 6.0 6.1  NEUTROABS 3.7  --   --   HGB 10.6* 8.7* 9.1*  HCT 35.9* 29.6* 31.9*  MCV 100.6* 101.7* 104.6*  PLT 257 214 233   Cardiac Enzymes: No results for input(s): CKTOTAL, CKMB, CKMBINDEX, TROPONINI in the last 168  hours. BNP: Invalid input(s): POCBNP CBG:  Recent Labs Lab 05/29/15 1710 05/29/15 1951 05/30/15 05/30/15 0418 05/30/15 0752  GLUCAP 87 96 95 103* 86    Recent Results (from the past 240 hour(s))  Blood Culture (routine x 2)     Status: None (Preliminary result)   Collection Time: 05/28/15  7:48 PM  Result Value Ref Range Status   Specimen Description BLOOD RIGHT ANTECUBITAL  Final   Special Requests BOTTLES DRAWN AEROBIC AND ANAEROBIC 5CC  Final   Culture NO GROWTH < 24 HOURS  Final   Report Status PENDING  Incomplete  Blood Culture (routine x 2)     Status: None (Preliminary result)   Collection Time: 05/28/15  8:03 PM  Result Value Ref Range Status   Specimen Description BLOOD LEFT ANTECUBITAL  Final   Special Requests BOTTLES DRAWN AEROBIC AND ANAEROBIC 5CC  Final   Culture NO GROWTH < 24 HOURS  Final   Report Status PENDING  Incomplete  Urine culture     Status: None   Collection Time: 05/28/15  8:47 PM  Result Value Ref Range Status   Specimen Description URINE, CATHETERIZED  Final   Special Requests NONE  Final   Culture >=100,000 COLONIES/mL YEAST  Final   Report Status 05/30/2015 FINAL  Final  Wound culture     Status: None (Preliminary result)   Collection Time: 05/29/15  2:32 AM  Result Value Ref  Range Status   Specimen Description ULCER  Final   Special Requests Immunocompromised  Final   Gram Stain   Final    RARE WBC PRESENT, PREDOMINANTLY PMN RARE SQUAMOUS EPITHELIAL CELLS PRESENT FEW GRAM POSITIVE COCCI IN PAIRS IN CLUSTERS RARE GRAM NEGATIVE RODS Performed at [facility]    Culture   Final    Culture reincubated for better growth Performed at [facility]    Report Status PENDING  Incomplete  MRSA PCR Screening     Status: None   Collection Time: 05/29/15  2:32 AM  Result Value Ref Range Status   MRSA by PCR NEGATIVE NEGATIVE Final    Comment:        The GeneXpert MRSA Assay (FDA approved for NASAL specimens only), is one  component of a comprehensive MRSA colonization surveillance program. It is not intended to diagnose MRSA infection nor to guide or monitor treatment for MRSA infections.      Scheduled Meds: . antiseptic oral rinse  7 mL Mouth Rinse q12n4p  . aspirin  81 mg Oral Daily  . cefTAZidime (FORTAZ)  IV  2 g Intravenous Q12H  . chlorhexidine  15 mL Mouth Rinse BID  . chlorproMAZINE  100 mg Oral Daily  . chlorproMAZINE  50 mg Oral 2 times per day  . collagenase   Topical Daily  . divalproex  1,250 mg Oral QHS  . feeding supplement (PRO-STAT SUGAR FREE 64)  30 mL Oral TID  . heparin  5,000 Units Subcutaneous 3 times per day  . insulin aspart  0-9 Units Subcutaneous 6 times per day  . ipratropium-albuterol  3 mL Nebulization Q6H  . lamoTRIgine  100 mg Oral BID  . levothyroxine  150 mcg Oral QAC breakfast  . nicotine  21 mg Transdermal Daily  . pantoprazole  40 mg Oral Daily  . sodium chloride  3 mL Intravenous Q12H  . vancomycin  1,750 mg Intravenous Q24H   Continuous Infusions: . sodium chloride 1,000 mL (05/30/15 1117)     Sharanya Templin, DO  [facility] Pager [phone]  If 7PM-7AM, please contact night-coverage [email or web address] Password [email or web address] 05/30/2015, 11:54 AM   LOS: 2 days

## 2015-05-30 NOTE — Progress Notes (Signed)
Pt back on CPAP. Still agitated and attempting to remove so put safety mittens on. Will continue to monitor.

## 2015-05-30 NOTE — Progress Notes (Signed)
Pt's BP 84/30. She is sleepy and unable to answer questions to determine symptoms. Dr. Arbutus Leas notified

## 2015-05-30 NOTE — Progress Notes (Addendum)
Re-evaluated patient after repeat ABG on CPAP.  Pt is tolerating CPAP better and able to keep mask on face.  Much more alert and mental status near baseline and following commands.  Repeat ABG 7.27/70/79/31.   Continue CPAP for now as long as pt is not agitated and keeping mask on face.  Will move to stepdown for Bipap if pt becomes somnolent or agitated. Saline lock IVF.  Check am labs.  Started lactulose for elevated ammonia.  Elevated ammonia likely due to chronic hepatic congestion from OHS.  DTat  1730--Addendum --Re-evaluated patient again --pt became agitated and took off CPAP mask --VSS --Pt is redirectable;  Discussed with pt that she must keep on mask. --transfer patient to stepdown  --place pt on Bipap to tolerance --Haldol prn agitation  DTat

## 2015-05-30 NOTE — Progress Notes (Signed)
Pt making minimal urine after multiple boluses this am and 125/hr of normal saline. Bladder scan showed <50 cc urine. Will continue to monitor.

## 2015-05-30 NOTE — Progress Notes (Signed)
Patient wanted to remove BIPAP. Placed patient on nasal cannula set at 4lpm. Will place patient back BIPAP for the night.

## 2015-05-30 NOTE — Progress Notes (Signed)
Notified pt's friend Tedra Senegal of pt's transfer

## 2015-05-30 NOTE — Progress Notes (Signed)
RT Note: Pt transported from 6E to 3S while on bipap with no complications. Pt tolerating well at this time. RT will continue to monitor.

## 2015-05-30 NOTE — Progress Notes (Signed)
Late entry:  Patient received additional 500 cc bolus due to low blood pressure.  BP 89/50 after bolus completion.  Notified Claiborne Billings NP.  No new orders at this time.  Patient received 1.5 liters total this shift.Marland Kitchen

## 2015-05-31 ENCOUNTER — Inpatient Hospital Stay (HOSPITAL_COMMUNITY): Payer: Medicare Other

## 2015-05-31 DIAGNOSIS — J9621 Acute and chronic respiratory failure with hypoxia: Secondary | ICD-10-CM

## 2015-05-31 DIAGNOSIS — J9622 Acute and chronic respiratory failure with hypercapnia: Secondary | ICD-10-CM

## 2015-05-31 DIAGNOSIS — R0602 Shortness of breath: Secondary | ICD-10-CM

## 2015-05-31 LAB — CBC
HEMATOCRIT: 33.2 % — AB (ref 36.0–46.0)
HEMOGLOBIN: 9.6 g/dL — AB (ref 12.0–15.0)
MCH: 30 pg (ref 26.0–34.0)
MCHC: 28.9 g/dL — AB (ref 30.0–36.0)
MCV: 103.8 fL — ABNORMAL HIGH (ref 78.0–100.0)
Platelets: 210 10*3/uL (ref 150–400)
RBC: 3.2 MIL/uL — ABNORMAL LOW (ref 3.87–5.11)
RDW: 18.9 % — ABNORMAL HIGH (ref 11.5–15.5)
WBC: 5.4 10*3/uL (ref 4.0–10.5)

## 2015-05-31 LAB — BASIC METABOLIC PANEL
ANION GAP: 4 — AB (ref 5–15)
BUN: 6 mg/dL (ref 6–20)
CHLORIDE: 104 mmol/L (ref 101–111)
CO2: 33 mmol/L — AB (ref 22–32)
CREATININE: 1.25 mg/dL — AB (ref 0.44–1.00)
Calcium: 8.4 mg/dL — ABNORMAL LOW (ref 8.9–10.3)
GFR calc non Af Amer: 45 mL/min — ABNORMAL LOW (ref >60)
GFR, EST AFRICAN AMERICAN: 52 mL/min — AB (ref >60)
Glucose, Bld: 95 mg/dL (ref 65–99)
Potassium: 3.6 mmol/L (ref 3.5–5.1)
Sodium: 141 mmol/L (ref 135–145)

## 2015-05-31 MED ORDER — FUROSEMIDE 10 MG/ML IJ SOLN
40.0000 mg | Freq: Every day | INTRAMUSCULAR | Status: DC
  Administered 2015-05-31 – 2015-06-01 (×2): 40 mg via INTRAVENOUS
  Filled 2015-05-31 (×2): qty 4

## 2015-05-31 MED ORDER — PANTOPRAZOLE SODIUM 40 MG IV SOLR
40.0000 mg | INTRAVENOUS | Status: DC
  Administered 2015-06-01: 40 mg via INTRAVENOUS
  Filled 2015-05-31: qty 40

## 2015-05-31 MED ORDER — INFLUENZA VAC SPLIT QUAD 0.5 ML IM SUSY
0.5000 mL | PREFILLED_SYRINGE | INTRAMUSCULAR | Status: DC
  Filled 2015-05-31: qty 0.5

## 2015-05-31 MED ORDER — LEVOTHYROXINE SODIUM 100 MCG IV SOLR
75.0000 ug | Freq: Every day | INTRAVENOUS | Status: DC
Start: 2015-05-31 — End: 2015-06-02
  Administered 2015-06-01 – 2015-06-02 (×2): 75 ug via INTRAVENOUS
  Filled 2015-05-31 (×2): qty 5

## 2015-05-31 MED ORDER — VANCOMYCIN HCL 10 G IV SOLR
1250.0000 mg | Freq: Two times a day (BID) | INTRAVENOUS | Status: DC
  Administered 2015-05-31 – 2015-06-04 (×9): 1250 mg via INTRAVENOUS
  Filled 2015-05-31 (×10): qty 1250

## 2015-05-31 MED ORDER — DEXTROSE 5 % IV SOLN
2.0000 g | Freq: Three times a day (TID) | INTRAVENOUS | Status: DC
  Administered 2015-05-31 – 2015-06-02 (×5): 2 g via INTRAVENOUS
  Filled 2015-05-31 (×7): qty 2

## 2015-05-31 NOTE — Progress Notes (Signed)
VASCULAR LAB PRELIMINARY  PRELIMINARY  PRELIMINARY  PRELIMINARY  Bilateral lower extremity venous duplex  completed.    Preliminary report:  Bilateral:  Not adequately visualized due to body habitus.  No obvious evidence of DVT.  Gavino Fouch, RVT 05/31/2015, 11:33 AM

## 2015-05-31 NOTE — Progress Notes (Signed)
ANTIBIOTIC CONSULT NOTE - FOLLOW UP  Pharmacy Consult for Vancomycin/Fortaz Indication: Cellulitis/Sepsis  Allergies  Allergen Reactions  . Food Rash    "lasagna"= rash from the noodles  . Penicillins Hives    Per MAR- unable to ask quantification questions Can tolerate cephalosporins  . Sulfamethoxazole-Trimethoprim Hives and Nausea And Vomiting    Patient Measurements: Height:  (157.5 cm) Weight: (!) 410 lb (185.975 kg) IBW/kg (Calculated) : 50.1   Vital Signs: Temp: 98.4 F (36.9 C) (12/11 1245) Temp Source: Oral (12/11 1245) BP: 112/67 mmHg (12/11 0810) Pulse Rate: 88 (12/11 0810) Intake/Output from previous day: 12/10 0701 - 12/11 0700 In: 1513 [P.O.:960; I.V.:3; IV Piggyback:550] Out: 0  Intake/Output from this shift: Total I/O In: 50 [IV Piggyback:50] Out: 1 [Urine:1]  Labs:  Recent Labs  05/29/15 0453 05/30/15 0506 05/30/15 0600 05/31/15 0354  WBC 6.0  --  6.1 5.4  HGB 8.7*  --  9.1* 9.6*  PLT 214  --  233 210  LABCREA  --  286.49  --   --   CREATININE 1.90*  --  1.53* 1.25*   Estimated Creatinine Clearance: 77 mL/min (by C-G formula based on Cr of 1.25). No results for input(s): VANCOTROUGH, VANCOPEAK, VANCORANDOM, GENTTROUGH, GENTPEAK, GENTRANDOM, TOBRATROUGH, TOBRAPEAK, TOBRARND, AMIKACINPEAK, AMIKACINTROU, AMIKACIN in the last 72 hours.   Microbiology: Recent Results (from the past 720 hour(s))  Culture, blood (routine x 2)     Status: None   Collection Time: 05/13/15  1:05 PM  Result Value Ref Range Status   Specimen Description BLOOD RIGHT ANTECUBITAL  Final   Special Requests BOTTLES DRAWN AEROBIC ONLY 5CC  Final   Culture  Setup Time   Final    GRAM POSITIVE COCCI IN CLUSTERS AEROBIC BOTTLE ONLY CRITICAL RESULT CALLED TO, READ BACK BY AND VERIFIED WITH: R JOHNSON 05/14/15 @ 1157 M VESTAL    Culture   Final    STAPHYLOCOCCUS SPECIES (COAGULASE NEGATIVE) THE SIGNIFICANCE OF ISOLATING THIS ORGANISM FROM A SINGLE VENIPUNCTURE CANNOT  BE PREDICTED WITHOUT FURTHER CLINICAL AND CULTURE CORRELATION. SUSCEPTIBILITIES AVAILABLE ONLY ON REQUEST. Performed at [facility]    Report Status 05/15/2015 FINAL  Final  Culture, blood (routine x 2)     Status: None   Collection Time: 05/13/15  1:10 PM  Result Value Ref Range Status   Specimen Description BLOOD LEFT ARM  Final   Special Requests BOTTLES DRAWN AEROBIC AND ANAEROBIC  Final   Culture   Final    NO GROWTH 5 DAYS Performed at [facility]    Report Status 05/18/2015 FINAL  Final  MRSA PCR Screening     Status: Abnormal   Collection Time: 05/13/15  7:00 PM  Result Value Ref Range Status   MRSA by PCR INVALID RESULTS, SPECIMEN SENT FOR CULTURE (A) NEGATIVE Final    Comment: RESULT CALLED TO, READ BACK BY AND VERIFIED WITH: Sheppard Penton [id number] @ 2123 BY J SCOTTON        The GeneXpert MRSA Assay (FDA approved for NASAL specimens only), is one component of a comprehensive MRSA colonization surveillance program. It is not intended to diagnose MRSA infection nor to guide or monitor treatment for MRSA infections.   MRSA culture     Status: None   Collection Time: 05/13/15  7:00 PM  Result Value Ref Range Status   Specimen Description NOSE  Final   Special Requests NONE  Final   Culture NOMRSA Performed at [facility]   Final  Report Status 05/15/2015 FINAL  Final  Urine culture     Status: None   Collection Time: 05/13/15  9:39 PM  Result Value Ref Range Status   Specimen Description URINE, CATHETERIZED  Final   Special Requests NONE  Final   Culture   Final    >=100,000 COLONIES/mL ESCHERICHIA COLI Performed at [facility]    Report Status 05/16/2015 FINAL  Final   Organism ID, Bacteria ESCHERICHIA COLI  Final      Susceptibility   Escherichia coli - MIC*    AMPICILLIN 4 SENSITIVE Sensitive     CEFAZOLIN <=4 SENSITIVE Sensitive     CEFTRIAXONE <=1 SENSITIVE Sensitive     CIPROFLOXACIN >=4 RESISTANT Resistant      GENTAMICIN <=1 SENSITIVE Sensitive     IMIPENEM <=0.25 SENSITIVE Sensitive     NITROFURANTOIN <=16 SENSITIVE Sensitive     TRIMETH/SULFA >=320 RESISTANT Resistant     AMPICILLIN/SULBACTAM <=2 SENSITIVE Sensitive     PIP/TAZO <=4 SENSITIVE Sensitive     * >=100,000 COLONIES/mL ESCHERICHIA COLI  Blood Culture (routine x 2)     Status: None (Preliminary result)   Collection Time: 05/28/15  7:48 PM  Result Value Ref Range Status   Specimen Description BLOOD RIGHT ANTECUBITAL  Final   Special Requests BOTTLES DRAWN AEROBIC AND ANAEROBIC 5CC  Final   Culture NO GROWTH 3 DAYS  Final   Report Status PENDING  Incomplete  Blood Culture (routine x 2)     Status: None (Preliminary result)   Collection Time: 05/28/15  8:03 PM  Result Value Ref Range Status   Specimen Description BLOOD LEFT ANTECUBITAL  Final   Special Requests BOTTLES DRAWN AEROBIC AND ANAEROBIC 5CC  Final   Culture NO GROWTH 3 DAYS  Final   Report Status PENDING  Incomplete  Urine culture     Status: None   Collection Time: 05/28/15  8:47 PM  Result Value Ref Range Status   Specimen Description URINE, CATHETERIZED  Final   Special Requests NONE  Final   Culture >=100,000 COLONIES/mL YEAST  Final   Report Status 05/30/2015 FINAL  Final  Wound culture     Status: None (Preliminary result)   Collection Time: 05/29/15  2:32 AM  Result Value Ref Range Status   Specimen Description ULCER  Final   Special Requests Immunocompromised  Final   Gram Stain   Final    RARE WBC PRESENT, PREDOMINANTLY PMN RARE SQUAMOUS EPITHELIAL CELLS PRESENT FEW GRAM POSITIVE COCCI IN PAIRS IN CLUSTERS RARE GRAM NEGATIVE RODS Performed at [facility]    Culture   Final    MODERATE PSEUDOMONAS AERUGINOSA Note: Susceptibilities to follow Performed at [facility]    Report Status PENDING  Incomplete  MRSA PCR Screening     Status: None   Collection Time: 05/29/15  2:32 AM  Result Value Ref Range Status   MRSA by PCR NEGATIVE  NEGATIVE Final    Comment:        The GeneXpert MRSA Assay (FDA approved for NASAL specimens only), is one component of a comprehensive MRSA colonization surveillance program. It is not intended to diagnose MRSA infection nor to guide or monitor treatment for MRSA infections.     Anti-infectives    Start     Dose/Rate Route Frequency Ordered Stop   05/29/15 1130  cefTAZidime (FORTAZ) 2 g in dextrose 5 % 50 mL IVPB     2 g 100 mL/hr over 30 Minutes Intravenous Every 12  hours 05/29/15 1051     05/29/15 0230  vancomycin (VANCOCIN) 1,750 mg in sodium chloride 0.9 % 500 mL IVPB     1,750 mg 250 mL/hr over 120 Minutes Intravenous Every 24 hours 05/29/15 0154        Assessment: 64 YOF from SNF presented w/ hypoxia, cellulitis/multiple bed sores (recent course of kelfex for UTI and abx for cellulitis during recent admit).  Vancomycin dose with morbid obesity nomogram.  Wt = 186 kg Renal function has improved: SCR 2.02> 1.9>1.25, est CrCl ~ 32 >77 (adjusted BW)   VANC 12/9>  Fortaz 12/9>  12/8 urine > 100K yeast 12/9 MRSA PCR neg 12/9 Blood x2 sent 12/9 wound (+)GPCC, GNR    Goal of Therapy:  Vancomycin trough level 15-20 mcg/ml  Plan:  Ceftazidime 2 g IV q8h  Vancomycin 1250 q12h  F/U cx, watch renal fxn  05/31/2015,1:55 PM [name], PharmD Pharmacy Resident [phone]

## 2015-05-31 NOTE — Progress Notes (Signed)
PROGRESS NOTE  ABREA HENLE ZOX:[id number] DOB: [birth year] DOA: 05/28/2015 PCP: Margit Hanks, MD  Brief History 64 year old female with a history of mental retardation, schizoaffective disorder, OHS, bedbound, atrial fibrillation, epilepsy, and chronic respiratory failure presented from her nursing facility secondary to hypoxia and fever. Unfortunately, the patient is a poor historian. Initial workup revealed acute kidney injury with serum creatinine 2.05 with fever of 100.48F. She was noted to be hypoxic upon presentation with oxygen saturation in the mid 80s. The patient was admitted for intravenous antibiotics and infection workup. Notably, the patient was recently discharged from [facility] after a stay from 05/13/2015 through 05/21/2015 for acute on chronic respiratory failure requiring intubation and sepsis secondary to UTI and breast cellulitis Assessment/Plan: Fever/cellulitis of the back/infected friction ulcers -Blood cultures 2 sets--NGTD -Appreciate wound care nurse -Continue vancomycin -Continue ceftazidime  -nonsurgical setting wound culture--pseudomonas--unclear clinical significance -Air mattress -Urinalysis negative for pyuria -Chest x-ray negative for infiltrates  -05/29/2015--Gen. surgery was consulted--felt the patient did not need debridement and recommended medical therapy with antibiotics and wound care  Acute on chronic respiratory failure with hypoxia and hypercarbia -pt is normally on 2L [place] at SNF -Patient is stable on 2 L nasal cannula without lesion saturation 92-94% -05/30/2015--patient developed obtundation secondary to hypercarbia -Some improvement with CPAP, transferred to stepdown for BiPAP -Multifactorial including COPD/OHS -Now appears clinically volume overloaded -start furosemide -Aerosolized albuterol and Atrovent  -Wean oxygen for saturation greater than 92% -night time CPAP -do NOT titrate oxygen up for sat >94% as pt is chronic CO2  retainer  Acute Encephalopathy -due to infection and hypercarbia -05/30/2015 ABG--7.22/80/58/34 (3L) -some improvement with CPAP, but moved to stepdown for Bipap -Hold chlorpromazine for now -check B12--1129 -check ammonia--129  Hyperammonemia -due to chronic hepatic congestion from OHS -continue lactulose -recheck ammonia in am  Acute kidney injury -Baseline creatinine 0.7-1.0 -Continue intravenous fluids--> and creatinine improving but developed fluid overload -Suspect sepsis and volume depletion as etiology -Hold lisinopril   Acute on Chronic diastolic CHF -05/14/2015 echo EF 50-55%, PAP 40 -pt clinically fluid overloaded -Daily weights -05/21/2015 discharge weight 415 pounds  -12/11--start IV furosemide  Atrial fibrillation history  -Presently in sinus rhythm  -CHADSVASc 2-3 -continue ASA  schizoaffective disorder/bipolar -Continue Depakote, Thorazine -Check VPA level--53  Impaired glucose tolerance -05/14/2015 hemoglobin A1c 5.9 -Discontinue CBG checks -Discontinue insulin  Hypothyroidism -Continue Synthroid -Check TSH-3.804  Leg pain and edema -Venous duplex r/o DVT--pending  Morbid obesity ->400 pounds and BMI of 72   Family Communication: Pt at beside Disposition Plan: SNF 2-3 days if medically stable    Procedures/Studies: US Renal  05/29/2015  CLINICAL DATA:  Acute kidney injury. EXAM: RENAL / URINARY TRACT ULTRASOUND COMPLETE COMPARISON:  06/20/2010 CT abdomen/ pelvis. 12/26/2009 abdominal sonogram. FINDINGS: Evaluation is significantly limited by patient related factors. Right Kidney: Length: 13.6 cm. Echogenicity within normal limits. No mass or hydronephrosis visualized. Left Kidney: Length: 12.6 cm. Echogenicity within normal limits. No mass or hydronephrosis visualized. Bladder: Appears normal for degree of bladder distention. IMPRESSION: Limited scan, with grossly normal kidneys and bladder. No hydronephrosis. Electronically Signed    By: Delbert Phenix M.D.   On: 05/29/2015 08:58   Dg Chest Port 1 View  05/30/2015  CLINICAL DATA:  Congestive heart failure EXAM: PORTABLE CHEST 1 VIEW COMPARISON:  Radiograph 05/28/2015 FINDINGS: Normal cardiac silhouette. Low lung volumes. Increase in central venous congestion. No pneumothorax. IMPRESSION: Decreased lung volumes and increased central venous congestion. Electronically  Signed   By: Genevive Bi M.D.   On: 05/30/2015 15:04   Dg Chest Port 1 View  05/28/2015  CLINICAL DATA:  Hypoxia, decreased O2 sats. EXAM: PORTABLE CHEST 1 VIEW COMPARISON:  05/17/2015. FINDINGS: Trachea is midline given patient rotation. Heart is enlarged, stable. Lungs are somewhat low in volume with probable vascular crowding. No definite pleural fluid. IMPRESSION: Low lung volumes with probable vascular crowding. No definite edema. Electronically Signed   By: Leanna Battles M.D.   On: 05/28/2015 20:02   Dg Chest Port 1 View  05/17/2015  CLINICAL DATA:  Endotracheal tube.  COPD.  Asthma EXAM: PORTABLE CHEST 1 VIEW COMPARISON:  05/16/2015 FINDINGS: Interval extubation and removal of NG tube. Stable enlarged cardiac silhouette. There is mild increase in fine airspace disease in the upper lobes. Low lung volumes. No increase in atelectasis. LEFT central venous line unchanged. IMPRESSION: 1. Stable lung volumes following extubation. 2. Slight increase in upper lobe airspace disease suggests mild pulmonary edema. Electronically Signed   By: Genevive Bi M.D.   On: 05/17/2015 07:13   Dg Chest Port 1 View  05/16/2015  CLINICAL DATA:  64 year old female currently intubated EXAM: PORTABLE CHEST 1 VIEW COMPARISON:  Prior chest x-ray 05/15/2015 FINDINGS: The tip of the endotracheal tube is 2.4 cm above the carina. A nasogastric tube is present and is coiled within the gastric body. Left subclavian approach central venous catheter remains in unchanged position with the tip overlying the superior cavoatrial junction.  Persistent low inspiratory volumes with bibasilar atelectasis versus infiltrate. Stable mild cardiomegaly. Slight blurring of the interstitial markings likely secondary to patient motion. Overall, no significant interval change in the appearance of the chest. No acute osseous abnormality. IMPRESSION: 1. The tip the endotracheal tube is 2.4 cm above the carina. Other support apparatus also in satisfactory and unchanged position. 2. No significant interval change in the appearance of the lungs with persistent low inspiratory volumes and bibasilar atelectasis versus infiltrate. 3. Unchanged borderline cardiomegaly. Electronically Signed   By: Malachy Moan M.D.   On: 05/16/2015 09:29   Portable Chest Xray  05/15/2015  CLINICAL DATA:  Intubation. EXAM: PORTABLE CHEST 1 VIEW COMPARISON:  05/14/2015. FINDINGS: Endotracheal tube 3 cm above the carina. Left subclavian central line stable position. NG tube in stable position. Mediastinum and hilar structures are unremarkable. Cardiomegaly with normal pulmonary vascularity. Bibasilar subsegmental atelectasis and/or infiltrates. Small right pleural effusion cannot be excluded. No pneumothorax . IMPRESSION: 1. Endotracheal tube 3 cm above the carina. Remaining lines and tubes in stable position. 2. Bibasilar subsegmental atelectasis and/or mild infiltrates. 3. Stable cardiomegaly . Electronically Signed   By: Maisie Fus  Register   On: 05/15/2015 07:06   Dg Chest Port 1 View  05/14/2015  CLINICAL DATA:  Central line and orogastric tube placement. Altered mental status. Initial encounter. EXAM: PORTABLE CHEST 1 VIEW COMPARISON:  Chest radiograph performed 05/13/2015 FINDINGS: The patient's endotracheal tube is seen ending just above the carina. This should be retracted 2-3 cm. An enteric tube is noted extending below the diaphragm. Patchy bilateral airspace opacification may reflect atelectasis. No definite pleural effusion or pneumothorax is seen. The cardiomediastinal  silhouette is enlarged. No acute osseous abnormalities are identified. IMPRESSION: 1. Endotracheal tube seen ending just above the carina. This should be retracted 2-3 cm. 2. Patchy bilateral airspace opacification may reflect atelectasis. Cardiomegaly noted. Electronically Signed   By: Roanna Raider M.D.   On: 05/14/2015 06:24   Dg Chest Port 1 View  05/13/2015  CLINICAL  DATA:  Hypoxia. Diabetes. Shortness of breath. Former smoker for 14 years. EXAM: PORTABLE CHEST 1 VIEW COMPARISON:  05/23/2013 FINDINGS: Shallow inspiration. Cardiac enlargement without significant vascular congestion. Mild volume loss in the right upper lung is probably due to shallow inspiration. No focal consolidation or airspace disease is appreciated. No blunting of costophrenic angles. No pneumothorax. Mediastinal contours appear intact. IMPRESSION: Cardiac enlargement.  Shallow inspiration.  No focal consolidation. Electronically Signed   By: Burman Nieves M.D.   On: 05/13/2015 20:06         Subjective: Patient is much more alert today. Denies any fevers, chills, chest discomfort first breath, nausea, vomiting, diarrhea, bone pain. No dysuria or hematuria.  Objective: Filed Vitals:   05/31/15 0328 05/31/15 0348 05/31/15 0805 05/31/15 0810  BP: 103/61   112/67  Pulse: 83 83  88  Temp: 98.5 F (36.9 C)  97.5 F (36.4 C)   TempSrc: Oral  Oral   Resp: 20 21  23   Height:      Weight:      SpO2: 97% 95%  90%    Intake/Output Summary (Last 24 hours) at 05/31/15 0904 Last data filed at 05/31/15 0211  Gross per 24 hour  Intake   1513 ml  Output      0 ml  Net   1513 ml   Weight change:  Exam:   General:  Pt is alert, follows commands appropriately, not in acute distress  HEENT: No icterus, No thrush, No neck mass, [place]/AT  Cardiovascular: RRR, S1/S2, no rubs, no gallops  Respiratory: Bibasilar crackles; no wheezing  Abdomen: Soft/+BS, non tender, non distended, no guarding  Extremities: 2+ LE  edema, No lymphangitis, No petechiae, No rashes, no synovitis; no clubbing or cyanosis  Data Reviewed: Basic Metabolic Panel:  Recent Labs Lab 05/28/15 1948 05/29/15 0453 05/30/15 0600 05/31/15 0354  NA 142 139 140 141  K 3.4* 3.5 3.9 3.6  CL 96* 98* 102 104  CO2 35* 36* 32 33*  GLUCOSE 100* 91 87 95  BUN 6 8 5* 6  CREATININE 2.05* 1.90* 1.53* 1.25*  CALCIUM 9.2 8.1* 7.9* 8.4*  MG  --  1.6*  --   --   PHOS  --  4.7*  --   --    Liver Function Tests:  Recent Labs Lab 05/28/15 1948  AST 14*  ALT 9*  ALKPHOS 55  BILITOT 0.4  PROT 6.2*  ALBUMIN 2.9*   No results for input(s): LIPASE, AMYLASE in the last 168 hours.  Recent Labs Lab 05/30/15 1310  AMMONIA 176*   CBC:  Recent Labs Lab 05/28/15 1948 05/29/15 0453 05/30/15 0600 05/31/15 0354  WBC 6.4 6.0 6.1 5.4  NEUTROABS 3.7  --   --   --   HGB 10.6* 8.7* 9.1* 9.6*  HCT 35.9* 29.6* 31.9* 33.2*  MCV 100.6* 101.7* 104.6* 103.8*  PLT 257 214 233 210   Cardiac Enzymes: No results for input(s): CKTOTAL, CKMB, CKMBINDEX, TROPONINI in the last 168 hours. BNP: Invalid input(s): POCBNP CBG:  Recent Labs Lab 05/29/15 1951 05/30/15 05/30/15 0418 05/30/15 0752 05/30/15 1205  GLUCAP 96 95 103* 86 95    Recent Results (from the past 240 hour(s))  Blood Culture (routine x 2)     Status: None (Preliminary result)   Collection Time: 05/28/15  7:48 PM  Result Value Ref Range Status   Specimen Description BLOOD RIGHT ANTECUBITAL  Final   Special Requests BOTTLES DRAWN AEROBIC AND ANAEROBIC 5CC  Final  Culture NO GROWTH 2 DAYS  Final   Report Status PENDING  Incomplete  Blood Culture (routine x 2)     Status: None (Preliminary result)   Collection Time: 05/28/15  8:03 PM  Result Value Ref Range Status   Specimen Description BLOOD LEFT ANTECUBITAL  Final   Special Requests BOTTLES DRAWN AEROBIC AND ANAEROBIC 5CC  Final   Culture NO GROWTH 2 DAYS  Final   Report Status PENDING  Incomplete  Urine culture      Status: None   Collection Time: 05/28/15  8:47 PM  Result Value Ref Range Status   Specimen Description URINE, CATHETERIZED  Final   Special Requests NONE  Final   Culture >=100,000 COLONIES/mL YEAST  Final   Report Status 05/30/2015 FINAL  Final  Wound culture     Status: None (Preliminary result)   Collection Time: 05/29/15  2:32 AM  Result Value Ref Range Status   Specimen Description ULCER  Final   Special Requests Immunocompromised  Final   Gram Stain   Final    RARE WBC PRESENT, PREDOMINANTLY PMN RARE SQUAMOUS EPITHELIAL CELLS PRESENT FEW GRAM POSITIVE COCCI IN PAIRS IN CLUSTERS RARE GRAM NEGATIVE RODS Performed at [facility]    Culture   Final    Culture reincubated for better growth Performed at [facility]    Report Status PENDING  Incomplete  MRSA PCR Screening     Status: None   Collection Time: 05/29/15  2:32 AM  Result Value Ref Range Status   MRSA by PCR NEGATIVE NEGATIVE Final    Comment:        The GeneXpert MRSA Assay (FDA approved for NASAL specimens only), is one component of a comprehensive MRSA colonization surveillance program. It is not intended to diagnose MRSA infection nor to guide or monitor treatment for MRSA infections.      Scheduled Meds: . antiseptic oral rinse  7 mL Mouth Rinse q12n4p  . aspirin  81 mg Oral Daily  . cefTAZidime (FORTAZ)  IV  2 g Intravenous Q12H  . chlorhexidine  15 mL Mouth Rinse BID  . collagenase   Topical Daily  . divalproex  1,250 mg Oral QHS  . feeding supplement (PRO-STAT SUGAR FREE 64)  30 mL Oral TID  . heparin  5,000 Units Subcutaneous 3 times per day  . ipratropium-albuterol  3 mL Nebulization Q6H  . lactulose  20 g Oral TID  . lamoTRIgine  100 mg Oral BID  . levothyroxine  150 mcg Oral QAC breakfast  . nicotine  21 mg Transdermal Daily  . pantoprazole  40 mg Oral Daily  . sodium chloride  3 mL Intravenous Q12H  . vancomycin  1,750 mg Intravenous Q24H   Continuous Infusions:     Apoorva Bugay, DO  [facility] Pager [phone]  If 7PM-7AM, please contact night-coverage [email or web address] Password [email or web address] 05/31/2015, 9:04 AM   LOS: 3 days

## 2015-05-31 NOTE — Progress Notes (Signed)
Foley inserted. Sterile technique maintained. 2 RNs present at time of insertion. Irving Burton RN inserted foley.

## 2015-05-31 NOTE — Procedures (Signed)
Pt placed on BiPAP for the night.  Pt is tolerating well and resting comfortably.  

## 2015-06-01 DIAGNOSIS — I509 Heart failure, unspecified: Secondary | ICD-10-CM

## 2015-06-01 DIAGNOSIS — J9622 Acute and chronic respiratory failure with hypercapnia: Secondary | ICD-10-CM

## 2015-06-01 DIAGNOSIS — A419 Sepsis, unspecified organism: Principal | ICD-10-CM

## 2015-06-01 DIAGNOSIS — G9340 Encephalopathy, unspecified: Secondary | ICD-10-CM

## 2015-06-01 DIAGNOSIS — J9621 Acute and chronic respiratory failure with hypoxia: Secondary | ICD-10-CM

## 2015-06-01 LAB — BLOOD GAS, ARTERIAL
Acid-Base Excess: 9.6 mmol/L — ABNORMAL HIGH (ref 0.0–2.0)
BICARBONATE: 35.9 meq/L — AB (ref 20.0–24.0)
DELIVERY SYSTEMS: POSITIVE
Drawn by: 275531
EXPIRATORY PAP: 6
FIO2: 0.4
Inspiratory PAP: 18
O2 Saturation: 93.9 %
Patient temperature: 98.6
RATE: 8 resp/min
TCO2: 38.1 mmol/L (ref 0–100)
pCO2 arterial: 72.9 mmHg (ref 35.0–45.0)
pH, Arterial: 7.313 — ABNORMAL LOW (ref 7.350–7.450)
pO2, Arterial: 74.8 mmHg — ABNORMAL LOW (ref 80.0–100.0)

## 2015-06-01 LAB — CBC
HEMATOCRIT: 35.3 % — AB (ref 36.0–46.0)
Hemoglobin: 9.9 g/dL — ABNORMAL LOW (ref 12.0–15.0)
MCH: 28.9 pg (ref 26.0–34.0)
MCHC: 28 g/dL — AB (ref 30.0–36.0)
MCV: 103.2 fL — ABNORMAL HIGH (ref 78.0–100.0)
Platelets: 248 10*3/uL (ref 150–400)
RBC: 3.42 MIL/uL — ABNORMAL LOW (ref 3.87–5.11)
RDW: 18.6 % — AB (ref 11.5–15.5)
WBC: 4.3 10*3/uL (ref 4.0–10.5)

## 2015-06-01 LAB — BASIC METABOLIC PANEL
Anion gap: 6 (ref 5–15)
CALCIUM: 8.9 mg/dL (ref 8.9–10.3)
CO2: 36 mmol/L — ABNORMAL HIGH (ref 22–32)
Chloride: 102 mmol/L (ref 101–111)
Creatinine, Ser: 0.96 mg/dL (ref 0.44–1.00)
GFR calc Af Amer: >60 mL/min (ref >60)
GLUCOSE: 81 mg/dL (ref 65–99)
Potassium: 3.4 mmol/L — ABNORMAL LOW (ref 3.5–5.1)
Sodium: 144 mmol/L (ref 135–145)

## 2015-06-01 LAB — GLUCOSE, CAPILLARY: Glucose-Capillary: 72 mg/dL (ref 65–99)

## 2015-06-01 MED ORDER — PANTOPRAZOLE SODIUM 40 MG PO TBEC
40.0000 mg | DELAYED_RELEASE_TABLET | Freq: Every day | ORAL | Status: DC
  Administered 2015-06-03 – 2015-06-04 (×2): 40 mg via ORAL
  Filled 2015-06-01 (×3): qty 1

## 2015-06-01 MED ORDER — ADULT MULTIVITAMIN W/MINERALS CH: 1.0000 | ORAL_TABLET | Freq: Every day | ORAL | Status: DC

## 2015-06-01 MED ORDER — DILTIAZEM HCL 100 MG IV SOLR
INTRAVENOUS | Status: AC
  Filled 2015-06-01: qty 100

## 2015-06-01 MED ORDER — POTASSIUM CHLORIDE 10 MEQ/100ML IV SOLN
10.0000 meq | INTRAVENOUS | Status: AC
  Administered 2015-06-01 (×2): 10 meq via INTRAVENOUS
  Filled 2015-06-01 (×2): qty 100

## 2015-06-01 MED ORDER — DILTIAZEM LOAD VIA INFUSION
10.0000 mg | Freq: Once | INTRAVENOUS | Status: AC
  Administered 2015-06-01: 10 mg via INTRAVENOUS

## 2015-06-01 MED ORDER — DILTIAZEM HCL 100 MG IV SOLR
5.0000 mg/h | INTRAVENOUS | Status: DC
  Administered 2015-06-01: 5 mg/h via INTRAVENOUS
  Filled 2015-06-01: qty 100

## 2015-06-01 MED ORDER — FUROSEMIDE 10 MG/ML IJ SOLN
40.0000 mg | Freq: Two times a day (BID) | INTRAMUSCULAR | Status: DC
  Administered 2015-06-01 – 2015-06-04 (×7): 40 mg via INTRAVENOUS
  Filled 2015-06-01 (×7): qty 4

## 2015-06-01 MED ORDER — INFLUENZA VAC SPLIT QUAD 0.5 ML IM SUSY: 0.5000 mL | PREFILLED_SYRINGE | INTRAMUSCULAR | Status: DC | PRN

## 2015-06-01 MED ORDER — GLUCERNA SHAKE PO LIQD
237.0000 mL | Freq: Three times a day (TID) | ORAL | Status: DC
  Administered 2015-06-01 – 2015-06-04 (×4): 237 mL via ORAL
  Filled 2015-06-01 (×4): qty 237

## 2015-06-01 NOTE — Consult Note (Signed)
PULMONARY / CRITICAL CARE MEDICINE   Name: [name] MRN: [id number] DOB: 11-May-1951    ADMISSION DATE:  05/28/2015 CONSULTATION DATE:  12/12  REFERRING MD:  Tat  CHIEF COMPLAINT:  Acute encephalopathy   HISTORY OF PRESENT ILLNESS:   64 year old female with a history of mental retardation, schizoaffective disorder, OHS, bedbound, atrial fibrillation, epilepsy, and chronic respiratory failure presented from her nursing facility secondary to hypoxia and fever. Initial workup revealed acute kidney injury with serum creatinine 2.05 with fever of 100.59F. She was noted to be hypoxic upon presentation with oxygen saturation in the mid 80s. The patient was admitted for intravenous antibiotics and infection workup. After admission, the patient was started on vancomycin and ceftazidime with improvement of her fever. Further evaluation also identified an ammonia level of 176. Since admission she has had progressive decreased mental status, have not been able to get her lactulose in. PCCM was asked to see on 12/12 for acute and progressive encephalopathy. An abg was obtained showing a POC2 of 72, baseline in 60s. We were asked to see and to help explain the etiology of her encephalopathy and to make further suggestions and consider intubation for airway protection.    PAST MEDICAL HISTORY :  She  has a past medical history of Bipolar disorder (HCC); Schizoaffective disorder (HCC); Mood disorder (HCC); Epilepsy (HCC); Thyroid disease; Hiatal hernia; Atrial flutter (HCC); Schizoaffective disorder; Urinary tract infection; Hypertension; GERD (gastroesophageal reflux disease); Hypothyroidism; Obesity; Asthma; COPD (chronic obstructive pulmonary disease) (HCC); CHF (congestive heart failure) (HCC); Seizures (HCC); Morbid obesity (HCC); Chronic pain; Hypercholesteremia; Anginal pain (HCC); Myocardial infarction [facility]) (2002); Diabetes mellitus; Anemia; History of blood transfusion (1993); H/O hiatal hernia;  Daily headache; Arthritis; Hypoventilation syndrome; Atrial fibrillation (HCC); Atrial flutter, paroxysmal (HCC); Exertional dyspnea; Shortness of breath; Pneumonia; Anxiety; Episodic mood disorder (HCC); Psychosis; Schizophrenia (HCC); RETARDATION, MENTAL, 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  PAST SURGICAL HISTORY: She  has past surgical history that includes Tubal ligation (1998); Cholecystectomy (?2008); Cardiac catheterization (2008); and Vaginal hysterectomy (1993).  Allergies  Allergen Reactions  . Food Rash    "lasagna"= rash from the noodles  . Penicillins Hives    Per MAR- unable to ask quantification questions Can tolerate cephalosporins  . Sulfamethoxazole-Trimethoprim Hives and Nausea And Vomiting    No current facility-administered medications on file prior to encounter.   Current Outpatient Prescriptions on File Prior to Encounter  Medication Sig  . Amino Acids-Protein Hydrolys (FEEDING SUPPLEMENT, PRO-STAT SUGAR FREE 64,) LIQD Take 30 mLs by mouth 3 (three) times daily.  Marland Kitchen aspirin 81 MG chewable tablet Chew 81 mg by mouth daily.  . bisacodyl (DULCOLAX) 10 MG suppository Place 10 mg rectally every 3 (three) days as needed for mild constipation or moderate constipation.  . chlorproMAZINE (THORAZINE) 50 MG tablet Take 50-100 mg by mouth. Take 50 mg twice daily (at 1200 and 1800) and take 100 mg at 2300  . collagenase (SANTYL) ointment Apply topically daily. Apply to necrotic ulcer on b/l flanks and right shoulder  . diphenhydrAMINE (BENADRYL) 25 MG tablet Take 25 mg by mouth every 8 (eight) hours as needed for itching.  . divalproex (DEPAKOTE ER) 250 MG 24 hr tablet Take 1,250 mg by mouth at bedtime.  . furosemide (LASIX) 20 MG tablet Take 2 tablets (40 mg total) by mouth 2 (two) times daily.  Marland Kitchen lamoTRIgine (LAMICTAL) 100 MG tablet Take 100 mg by mouth 2 (two) times daily. For mood disorder  . levothyroxine (SYNTHROID, LEVOTHROID) 150 MCG tablet Take 150  mcg by  mouth every morning. For thyroid disease.  Marland Kitchen lisinopril (PRINIVIL,ZESTRIL) 5 MG tablet Take 1 tablet (5 mg total) by mouth daily.  Marland Kitchen LORazepam (ATIVAN) 0.5 MG tablet Take one tablet by mouth three times daily as needed for anxiety  . nicotine (NICODERM CQ - DOSED IN MG/24 HOURS) 21 mg/24hr patch Place 1 patch (21 mg total) onto the skin daily.  Marland Kitchen omeprazole (PRILOSEC) 40 MG capsule Take 40 mg by mouth daily.  Marland Kitchen PRESCRIPTION MEDICATION Apply 1 application topically. Compounded ativan 0.5mg  gel.  Apply 1mg /17ml twice daily as needed for anxiety  . magnesium hydroxide (MILK OF MAGNESIA) 400 MG/5ML suspension Take 30 mLs by mouth daily as needed for mild constipation.  . [DISCONTINUED] ferrous sulfate 325 (65 FE) MG tablet Take 325 mg by mouth 2 (two) times daily.    FAMILY HISTORY:  Her indicated that her mother is deceased. She indicated that her father is alive.   SOCIAL HISTORY: She  reports that she quit smoking about 14 years ago. Her smoking use included Cigarettes. She has a 24 pack-year smoking history. Her smokeless tobacco use includes Chew. She reports that she does not drink alcohol or use illicit drugs.  REVIEW OF SYSTEMS:   Unable   SUBJECTIVE:  Lethargic and unresponsive   VITAL SIGNS: BP 120/87 mmHg  Pulse 138  Temp(Src) 98.8 F (37.1 C) (Oral)  Resp 24  Ht 5\' 2"  (1.575 m)  Wt 185.975 kg (410 lb)  BMI 74.97 kg/m2  SpO2 95%  HEMODYNAMICS:    VENTILATOR SETTINGS:    INTAKE / OUTPUT: I/O last 3 completed shifts: In: 1686 [P.O.:720; I.V.:66; IV Piggyback:900] Out: 3051 [Urine:3051]  PHYSICAL EXAMINATION: General:  Morbidly obese white female minimally responsive  Neuro:  Lethargic, currently on NIPPV. Generalized weakness HEENT:  Neck large. BIPAP in place  Cardiovascular:  Tachy irreg no MRG  Lungs:  Decreased t/o + accessory muscle use  Abdomen:  Obese, + bowel sounds  Musculoskeletal:  Generalized weakness  Skin:  Diffuse edema scattered ecchymosis    LABS:  BMET  Recent Labs Lab 05/30/15 0600 05/31/15 0354 06/01/15 0256  NA 140 141 144  K 3.9 3.6 3.4*  CL 102 104 102  CO2 32 33* 36*  BUN 5* 6 <5*  CREATININE 1.53* 1.25* 0.96  GLUCOSE 87 95 81    Electrolytes  Recent Labs Lab 05/29/15 0453 05/30/15 0600 05/31/15 0354 06/01/15 0256  CALCIUM 8.1* 7.9* 8.4* 8.9  MG 1.6*  --   --   --   PHOS 4.7*  --   --   --     CBC  Recent Labs Lab 05/30/15 0600 05/31/15 0354 06/01/15 0256  WBC 6.1 5.4 4.3  HGB 9.1* 9.6* 9.9*  HCT 31.9* 33.2* 35.3*  PLT 233 210 248    Coag's No results for input(s): APTT, INR in the last 168 hours.  Sepsis Markers  Recent Labs Lab 05/28/15 2001 05/28/15 2330  LATICACIDVEN 0.86 1.02    ABG  Recent Labs Lab 05/30/15 1156 05/30/15 1645 06/01/15 1216  PHART 7.227* 7.276* 7.313*  PCO2ART 80.3* 70.1* 72.9*  PO2ART 58.2* 79.0* 74.8*    Liver Enzymes  Recent Labs Lab 05/28/15 1948  AST 14*  ALT 9*  ALKPHOS 55  BILITOT 0.4  ALBUMIN 2.9*    Cardiac Enzymes No results for input(s): TROPONINI, PROBNP in the last 168 hours.  Glucose  Recent Labs Lab 05/29/15 1951 05/30/15 05/30/15 0418 05/30/15 0752 05/30/15 1205 06/01/15 1206  GLUCAP 96  95 103* 86 95 72    Imaging No results found.  wound culture 12/9>> pseudomonas  Blood cultures 2 sets 12/8>>> UC 12/8 yeast  -Continue vancomycin D#4 -Continue ceftazidime D#4   DISCUSSION:  Severe OSA/OHS w/ chronic acute on chronic respiratory failure w/ resultant Cor Pulmonale, volume overload and hepatic congestion-->likely leading to her hyperammonemia which is resulting in her encephalopathy. She has progressive FTT and have had a discussion w/ her emergency contact [name]. She indicates that [name] would NOT want on-going aggressive care, trach or prolonged ventilation. She understands that the above are simply progression of disease that can not be reversed long term. She agrees to DNR status and  DNI. Would cont BIPAP. Made her DNR. Consider NGT for lactulose if ongoing support requested. I think transition to palliative care would be an appropriate consideration at this point.   ASSESSMENT / PLAN:  Acute encephalopathy H/o schizoaffective disorder  H/o MR  suspect that this is metabolic more than hypercarbia as she is not far from baseline CO2 & ammonia level was 176 Plan Cont supportive care Try to get lactulose on board  Could consider NGT placement to facilitate this if on-going support desired.   Severe OSA/OHS w/ chronic acute on chronic respiratory failure w/ resultant Cor Pulmonale, volume overload and hepatic congestion-->likely leading to her hyperammonemia.  Plan Cont BIPAP NPO except meds  New AF w/ RVR: suspect this is from progression of her disease.  Plan ccb gtt per IM   AKI -->improving  Plan Cont current rx Renal dose meds   Cellulitis  Plan  Per IM service    Nothing further for [place] to offer We will s/o   Simonne Martinet ACNP-BC [facility] Pulmonary/Critical Care Pager # [phone] OR # [phone] if no answer   06/01/2015, 2:28 PM  Attending Note:  64 year old female with history of MR who presents to the hospital for AMS.  ABG was done and noted above.  Patient likely close to baseline CO2.  BiPAP was ordered and PCCM consulted.  Patient withdraws to pain.  Airway protection is questionable at best.  BiPAP tolerated.  Diffuse crackles on exam.  Labs reviewed ammonia level is very elevated.  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  Patient will need intubation and lactulose.  However, given baseline mental status would recommend comfort as if intubated patient is more likely than not will need a tracheostomy and with her mental status she will not tolerate or understand this.  More importantly the underlying heart and liver failure will result in her demise regardless.  In my  opinion this would be futile care.  Spoke with the patient's guardian and she informed us patient would not want that level of aggressive care.  Therefore, will make DNR and recommend comfort at this point.  Guardian would like the patient back in her regular environment to receive hospice care there which sounds very appropriate.  Communicated that with primary.  PCCM will sign off, please call back if needed.  The patient is critically ill with multiple organ systems failure and requires high complexity decision making for assessment and support, frequent evaluation and titration of therapies, application of advanced monitoring technologies and extensive interpretation of multiple databases.   Critical Care Time devoted to patient care services described in this note is  35  Minutes. This time reflects time of care of this signee Dr [name]. This critical care time  does not reflect procedure time, or teaching time or supervisory time of PA/NP/Med student/Med Resident etc but could involve care discussion time.  Rush Farmer, M.D. [facility] Pulmonary/Critical Care Medicine. Pager: [phone]. After hours pager: [phone].

## 2015-06-01 NOTE — Progress Notes (Signed)
Called by RN as pt developed Afib with RVR HR 130-140 Pt has hx of Afib in past. BP120/87 Pt remains obtunded  CV-IRRR Lung scattered rales  STAT EKG--Afib with nonspecific ST changes -Start diltiazem drip -12/10 TSH 3.804  DTat

## 2015-06-01 NOTE — Progress Notes (Signed)
PROGRESS NOTE  [name]:[id number] DOB: 1951/05/23 DOA: 05/28/2015 PCP: Margit Hanks, MD  Brief History 64 year old female with a history of mental retardation, schizoaffective disorder, OHS, bedbound, atrial fibrillation, epilepsy, and chronic respiratory failure presented from her nursing facility secondary to hypoxia and fever. Unfortunately, the patient is a poor historian. Initial workup revealed acute kidney injury with serum creatinine 2.05 with fever of 100.40F. She was noted to be hypoxic upon presentation with oxygen saturation in the mid 80s. The patient was admitted for intravenous antibiotics and infection workup. Notably, the patient was recently discharged from [facility] after a stay from 05/13/2015 through 05/21/2015 for acute on chronic respiratory failure requiring intubation and sepsis secondary to UTI and breast cellulitis.  After admission, the patient was started on vancomycin and ceftazidime with improvement of her fever. Assessment/Plan: Fever/cellulitis of the back/infected friction ulcers -Blood cultures 2 sets--NGTD -Appreciate wound care nurse -Continue vancomycin D#4 -Continue ceftazidime D#4 -nonsurgical setting wound culture--pseudomonas--unclear clinical significance -Air mattress -Urinalysis negative for pyuria -Chest x-ray negative for infiltrates  -05/29/2015--Gen. surgery was consulted--felt the patient did not need debridement and recommended medical therapy with antibiotics and wound care  Acute on chronic respiratory failure with hypoxia and hypercarbia -pt is normally on 2L [place] at SNF -05/30/2015--patient developed obtundation secondary to hypercarbia -Some improvement with CPAP, transferred to stepdown for BiPAP -Multifactorial including COPD/OHS -Now appears clinically volume overloaded -05/31/15--started furosemide -Aerosolized albuterol and Atrovent  -night time CPAP -do NOT titrate oxygen up for sat >94% as pt is chronic  CO2 retainer -06/01/15--consulted pulmonary/CCM  Acute Encephalopathy -due to infection and hypercarbia -05/30/2015 ABG--7.22/80/58/34 (3L) -some improvement with CPAP, but moved to stepdown for Bipap -Hold chlorpromazine  -check B12--1129 -12/10--ammonia--129 -05/31/15--more awake, returned to baseline -06/01/15 AM--obtunded despite placing back on BiPAP-->consulted Pulmonary/CCM -06/01/15 ABG after 2-3hrs BiPAP--7.31/72/74/35 (0.4)  Hyperammonemia -due to chronic hepatic congestion from OHS -continue lactulose -recheck ammonia in am -may need NG tube for lactulose  Acute kidney injury -Baseline creatinine 0.7-1.0 -Continue intravenous fluids--> and creatinine improving but developed fluid overload -Suspect sepsis and volume depletion as etiology -Hold lisinopril   Acute on Chronic diastolic CHF -05/14/2015 echo EF 50-55%, PAP 40 -pt clinically fluid overloaded -Daily weights -05/21/2015 discharge weight 415 pounds  -12/11--start IV furosemide -urine output 3 L with one dose Lasix but still 6L positive for the admission-->increase to bid  Atrial fibrillation history  -Presently in sinus rhythm  -CHADSVASc 2-3 -continue ASA  schizoaffective disorder/bipolar -Continue Depakote, Thorazine -Check VPA level--53  Impaired glucose tolerance -05/14/2015 hemoglobin A1c 5.9 -Discontinue CBG checks -Discontinue insulin  Hypothyroidism -Continue Synthroid -Check TSH-3.804  Leg pain and edema -Venous duplex r/o DVT--NEG  Morbid obesity ->400 pounds and BMI of 72   Family Communication: Pt at beside Disposition Plan: SNF when medically stable  Procedures/Studies: US Renal  05/29/2015  CLINICAL DATA:  Acute kidney injury. EXAM: RENAL / URINARY TRACT ULTRASOUND COMPLETE COMPARISON:  06/20/2010 CT abdomen/ pelvis. 12/26/2009 abdominal sonogram. FINDINGS: Evaluation is significantly limited by patient related factors. Right Kidney: Length: 13.6 cm. Echogenicity  within normal limits. No mass or hydronephrosis visualized. Left Kidney: Length: 12.6 cm. Echogenicity within normal limits. No mass or hydronephrosis visualized. Bladder: Appears normal for degree of bladder distention. IMPRESSION: Limited scan, with grossly normal kidneys and bladder. No hydronephrosis. Electronically Signed   By: Delbert Phenix M.D.   On: 05/29/2015 08:58   Dg Chest Port 1 View  05/30/2015  CLINICAL DATA:  Congestive heart failure EXAM: PORTABLE CHEST 1 VIEW COMPARISON:  Radiograph 05/28/2015 FINDINGS: Normal cardiac silhouette. Low lung volumes. Increase in central venous congestion. No pneumothorax. IMPRESSION: Decreased lung volumes and increased central venous congestion. Electronically Signed   By: Genevive Bi M.D.   On: 05/30/2015 15:04   Dg Chest Port 1 View  05/28/2015  CLINICAL DATA:  Hypoxia, decreased O2 sats. EXAM: PORTABLE CHEST 1 VIEW COMPARISON:  05/17/2015. FINDINGS: Trachea is midline given patient rotation. Heart is enlarged, stable. Lungs are somewhat low in volume with probable vascular crowding. No definite pleural fluid. IMPRESSION: Low lung volumes with probable vascular crowding. No definite edema. Electronically Signed   By: Leanna Battles M.D.   On: 05/28/2015 20:02   Dg Chest Port 1 View  05/17/2015  CLINICAL DATA:  Endotracheal tube.  COPD.  Asthma EXAM: PORTABLE CHEST 1 VIEW COMPARISON:  05/16/2015 FINDINGS: Interval extubation and removal of NG tube. Stable enlarged cardiac silhouette. There is mild increase in fine airspace disease in the upper lobes. Low lung volumes. No increase in atelectasis. LEFT central venous line unchanged. IMPRESSION: 1. Stable lung volumes following extubation. 2. Slight increase in upper lobe airspace disease suggests mild pulmonary edema. Electronically Signed   By: Genevive Bi M.D.   On: 05/17/2015 07:13   Dg Chest Port 1 View  05/16/2015  CLINICAL DATA:  64 year old female currently intubated EXAM: PORTABLE  CHEST 1 VIEW COMPARISON:  Prior chest x-ray 05/15/2015 FINDINGS: The tip of the endotracheal tube is 2.4 cm above the carina. A nasogastric tube is present and is coiled within the gastric body. Left subclavian approach central venous catheter remains in unchanged position with the tip overlying the superior cavoatrial junction. Persistent low inspiratory volumes with bibasilar atelectasis versus infiltrate. Stable mild cardiomegaly. Slight blurring of the interstitial markings likely secondary to patient motion. Overall, no significant interval change in the appearance of the chest. No acute osseous abnormality. IMPRESSION: 1. The tip the endotracheal tube is 2.4 cm above the carina. Other support apparatus also in satisfactory and unchanged position. 2. No significant interval change in the appearance of the lungs with persistent low inspiratory volumes and bibasilar atelectasis versus infiltrate. 3. Unchanged borderline cardiomegaly. Electronically Signed   By: Malachy Moan M.D.   On: 05/16/2015 09:29   Portable Chest Xray  05/15/2015  CLINICAL DATA:  Intubation. EXAM: PORTABLE CHEST 1 VIEW COMPARISON:  05/14/2015. FINDINGS: Endotracheal tube 3 cm above the carina. Left subclavian central line stable position. NG tube in stable position. Mediastinum and hilar structures are unremarkable. Cardiomegaly with normal pulmonary vascularity. Bibasilar subsegmental atelectasis and/or infiltrates. Small right pleural effusion cannot be excluded. No pneumothorax . IMPRESSION: 1. Endotracheal tube 3 cm above the carina. Remaining lines and tubes in stable position. 2. Bibasilar subsegmental atelectasis and/or mild infiltrates. 3. Stable cardiomegaly . Electronically Signed   By: Maisie Fus  Register   On: 05/15/2015 07:06   Dg Chest Port 1 View  05/14/2015  CLINICAL DATA:  Central line and orogastric tube placement. Altered mental status. Initial encounter. EXAM: PORTABLE CHEST 1 VIEW COMPARISON:  Chest radiograph  performed 05/13/2015 FINDINGS: The patient's endotracheal tube is seen ending just above the carina. This should be retracted 2-3 cm. An enteric tube is noted extending below the diaphragm. Patchy bilateral airspace opacification may reflect atelectasis. No definite pleural effusion or pneumothorax is seen. The cardiomediastinal silhouette is enlarged. No acute osseous abnormalities are identified. IMPRESSION: 1. Endotracheal tube seen ending just above the carina. This should be retracted 2-3  cm. 2. Patchy bilateral airspace opacification may reflect atelectasis. Cardiomegaly noted. Electronically Signed   By: Roanna Raider M.D.   On: 05/14/2015 06:24   Dg Chest Port 1 View  05/13/2015  CLINICAL DATA:  Hypoxia. Diabetes. Shortness of breath. Former smoker for 14 years. EXAM: PORTABLE CHEST 1 VIEW COMPARISON:  05/23/2013 FINDINGS: Shallow inspiration. Cardiac enlargement without significant vascular congestion. Mild volume loss in the right upper lung is probably due to shallow inspiration. No focal consolidation or airspace disease is appreciated. No blunting of costophrenic angles. No pneumothorax. Mediastinal contours appear intact. IMPRESSION: Cardiac enlargement.  Shallow inspiration.  No focal consolidation. Electronically Signed   By: Burman Nieves M.D.   On: 05/13/2015 20:06        Subjective: Pt is obtunded, moans and occasionally opens eyes to protopathic stimuli.  No distress on BiPaP.  No reports of vomiting or respiratory distress.  Objective: Filed Vitals:   06/01/15 0700 06/01/15 0819 06/01/15 0838 06/01/15 1100  BP:   112/61   Pulse:   92   Temp: 99.3 F (37.4 C)     TempSrc: Oral     Resp:   24 16  Height:      Weight:      SpO2:  97% 98% 98%    Intake/Output Summary (Last 24 hours) at 06/01/15 1245 Last data filed at 06/01/15 0732  Gross per 24 hour  Intake    363 ml  Output   3250 ml  Net  -2887 ml   Weight change:  Exam:   General:  Pt is alert, follows  commands appropriately, not in acute distress  HEENT: No icterus, No thrush, No neck mass, [place]/AT  Cardiovascular: RRR, S1/S2, no rubs, no gallops  Respiratory: scattered rales, no wheeze  Abdomen: Soft/+BS, non tender, non distended, no guarding  Extremities: 2+LE edema, No lymphangitis, No petechiae, No rashes, no synovitis  Data Reviewed: Basic Metabolic Panel:  Recent Labs Lab 05/28/15 1948 05/29/15 0453 05/30/15 0600 05/31/15 0354 06/01/15 0256  NA 142 139 140 141 144  K 3.4* 3.5 3.9 3.6 3.4*  CL 96* 98* 102 104 102  CO2 35* 36* 32 33* 36*  GLUCOSE 100* 91 87 95 81  BUN 6 8 5* 6 <5*  CREATININE 2.05* 1.90* 1.53* 1.25* 0.96  CALCIUM 9.2 8.1* 7.9* 8.4* 8.9  MG  --  1.6*  --   --   --   PHOS  --  4.7*  --   --   --    Liver Function Tests:  Recent Labs Lab 05/28/15 1948  AST 14*  ALT 9*  ALKPHOS 55  BILITOT 0.4  PROT 6.2*  ALBUMIN 2.9*   No results for input(s): LIPASE, AMYLASE in the last 168 hours.  Recent Labs Lab 05/30/15 1310  AMMONIA 176*   CBC:  Recent Labs Lab 05/28/15 1948 05/29/15 0453 05/30/15 0600 05/31/15 0354 06/01/15 0256  WBC 6.4 6.0 6.1 5.4 4.3  NEUTROABS 3.7  --   --   --   --   HGB 10.6* 8.7* 9.1* 9.6* 9.9*  HCT 35.9* 29.6* 31.9* 33.2* 35.3*  MCV 100.6* 101.7* 104.6* 103.8* 103.2*  PLT 257 214 233 210 248   Cardiac Enzymes: No results for input(s): CKTOTAL, CKMB, CKMBINDEX, TROPONINI in the last 168 hours. BNP: Invalid input(s): POCBNP CBG:  Recent Labs Lab 05/29/15 1951 05/30/15 05/30/15 0418 05/30/15 0752 05/30/15 1205  GLUCAP 96 95 103* 86 95    Recent Results (from the past 240  hour(s))  Blood Culture (routine x 2)     Status: None (Preliminary result)   Collection Time: 05/28/15  7:48 PM  Result Value Ref Range Status   Specimen Description BLOOD RIGHT ANTECUBITAL  Final   Special Requests BOTTLES DRAWN AEROBIC AND ANAEROBIC 5CC  Final   Culture NO GROWTH 3 DAYS  Final   Report Status PENDING   Incomplete  Blood Culture (routine x 2)     Status: None (Preliminary result)   Collection Time: 05/28/15  8:03 PM  Result Value Ref Range Status   Specimen Description BLOOD LEFT ANTECUBITAL  Final   Special Requests BOTTLES DRAWN AEROBIC AND ANAEROBIC 5CC  Final   Culture NO GROWTH 3 DAYS  Final   Report Status PENDING  Incomplete  Urine culture     Status: None   Collection Time: 05/28/15  8:47 PM  Result Value Ref Range Status   Specimen Description URINE, CATHETERIZED  Final   Special Requests NONE  Final   Culture >=100,000 COLONIES/mL YEAST  Final   Report Status 05/30/2015 FINAL  Final  Wound culture     Status: None (Preliminary result)   Collection Time: 05/29/15  2:32 AM  Result Value Ref Range Status   Specimen Description ULCER  Final   Special Requests Immunocompromised  Final   Gram Stain   Final    RARE WBC PRESENT, PREDOMINANTLY PMN RARE SQUAMOUS EPITHELIAL CELLS PRESENT FEW GRAM POSITIVE COCCI IN PAIRS IN CLUSTERS RARE GRAM NEGATIVE RODS Performed at [facility]    Culture   Final    MODERATE PSEUDOMONAS AERUGINOSA Note: Susceptibilities to follow Performed at [facility]    Report Status PENDING  Incomplete  MRSA PCR Screening     Status: None   Collection Time: 05/29/15  2:32 AM  Result Value Ref Range Status   MRSA by PCR NEGATIVE NEGATIVE Final    Comment:        The GeneXpert MRSA Assay (FDA approved for NASAL specimens only), is one component of a comprehensive MRSA colonization surveillance program. It is not intended to diagnose MRSA infection nor to guide or monitor treatment for MRSA infections.      Scheduled Meds: . antiseptic oral rinse  7 mL Mouth Rinse q12n4p  . aspirin  81 mg Oral Daily  . cefTAZidime (FORTAZ)  IV  2 g Intravenous 3 times per day  . chlorhexidine  15 mL Mouth Rinse BID  . collagenase   Topical Daily  . divalproex  1,250 mg Oral QHS  . feeding supplement (GLUCERNA SHAKE)  237 mL Oral TID BM    . feeding supplement (PRO-STAT SUGAR FREE 64)  30 mL Oral TID  . furosemide  40 mg Intravenous BID  . heparin  5,000 Units Subcutaneous 3 times per day  . ipratropium-albuterol  3 mL Nebulization Q6H  . lactulose  20 g Oral TID  . lamoTRIgine  100 mg Oral BID  . levothyroxine  75 mcg Intravenous QAC breakfast  . multivitamin with minerals  1 tablet Oral Daily  . nicotine  21 mg Transdermal Daily  . pantoprazole (PROTONIX) IV  40 mg Intravenous Q24H  . sodium chloride  3 mL Intravenous Q12H  . vancomycin  1,250 mg Intravenous Q12H   Continuous Infusions:    Tenessa Marsee, DO  [facility] Pager [phone]  If 7PM-7AM, please contact night-coverage [email or web address] Password [email or web address] 06/01/2015, 12:45 PM   LOS: 4 days

## 2015-06-01 NOTE — Progress Notes (Signed)
Called by CCM and notified pt heart rate is now 130-140 an appears to be A-fib RVR. Dr. Arbutus Leas called new order for 12 lead, obtained this and results given to Dr. Arbutus Leas. Will continue to monitor,   06/01/15 1306  Vitals  BP 120/87 mmHg  MAP (mmHg) 98  BP Location Right Arm  BP Method Automatic  Patient Position (if appropriate) Lying  Pulse Rate (!) 138  Pulse Rate Source Monitor  ECG Heart Rate (!) 137  Resp (!) 24  Oxygen Therapy  SpO2 99 %  O2 Device Bi-PAP  Pulse Oximetry Type Continuous   given to Dr. Arbutus Leas. Will continue to monitor,

## 2015-06-01 NOTE — Progress Notes (Addendum)
Initial Nutrition Assessment  DOCUMENTATION CODES:   Morbid obesity  INTERVENTION:    Glucerna Shake PO TID (as respiratory status allows), each supplement provides 220 kcal and 10 grams of protein  MVI daily  NUTRITION DIAGNOSIS:   Increased nutrient needs related to wound healing as evidenced by estimated needs.  GOAL:   Patient will meet greater than or equal to 90% of their needs  MONITOR:   PO intake, Supplement acceptance, Labs, Weight trends, Skin  REASON FOR ASSESSMENT:   Low [place]    ASSESSMENT:   64 year old female with a history of mental retardation, schizoaffective disorder, OHS, bedbound, atrial fibrillation, epilepsy, and chronic respiratory failure presented from her nursing facility secondary to hypoxia and fever. She was noted to be hypoxic upon presentation with oxygen saturation in the mid 80s. The patient was admitted for intravenous antibiotics and infection workup. Notably, the patient was recently discharged from [facility] after a stay from 05/13/2015 through 05/21/2015 for acute on chronic respiratory failure requiring intubation and sepsis secondary to UTI and breast cellulitis.  Patient with multiples areas of skin breakdown to back and shoulder (all stage II pressure injuries). She has increased nutrition needs to support healing. Per discussion with RN, she is requiring BiPAP at this time. She is not allowed to eat while on BiPAP. Will add Glucerna Shakes TID for patient to drink when not requiring BiPAP.   Diet Order:  Diet Carb Modified Fluid consistency:: Thin; Room service appropriate?: Yes  Skin:  Wound (see comment) (17 stg II pressure injuries to back; 2 stg II to arm )  Last BM:  12/11  Height:   Ht Readings from Last 1 Encounters:  05/28/15 5\' 2"  (1.575 m)    Weight:   Wt Readings from Last 1 Encounters:  05/31/15 410 lb (185.975 kg)    Ideal Body Weight:  50 kg  BMI:  Body mass index is 74.97 kg/(m^2).  Estimated Nutritional  Needs:   Kcal:  2000-2300  Protein:  125-140 gm  Fluid:  2-2.2 L  EDUCATION NEEDS:   No education needs identified at this time  Joaquin Courts, RD, LDN, CNSC Pager [phone] After Hours Pager [phone]

## 2015-06-01 NOTE — Procedures (Signed)
Pt removed from BiPAP by RN and placed back on [place].

## 2015-06-01 NOTE — Procedures (Signed)
Pt placed on BiPAP

## 2015-06-02 LAB — WOUND CULTURE

## 2015-06-02 LAB — CULTURE, BLOOD (ROUTINE X 2)
CULTURE: NO GROWTH
CULTURE: NO GROWTH

## 2015-06-02 LAB — BASIC METABOLIC PANEL
Anion gap: 7 (ref 5–15)
BUN: <5 mg/dL — ABNORMAL LOW (ref 6–20)
CALCIUM: 9.2 mg/dL (ref 8.9–10.3)
CO2: 38 mmol/L — AB (ref 22–32)
CREATININE: 0.89 mg/dL (ref 0.44–1.00)
Chloride: 97 mmol/L — ABNORMAL LOW (ref 101–111)
Glucose, Bld: 106 mg/dL — ABNORMAL HIGH (ref 65–99)
Potassium: 3.1 mmol/L — ABNORMAL LOW (ref 3.5–5.1)
SODIUM: 142 mmol/L (ref 135–145)

## 2015-06-02 LAB — AMMONIA: Ammonia: 78 umol/L — ABNORMAL HIGH (ref 9–35)

## 2015-06-02 LAB — MAGNESIUM: MAGNESIUM: 1.6 mg/dL — AB (ref 1.7–2.4)

## 2015-06-02 MED ORDER — POTASSIUM CHLORIDE 10 MEQ/100ML IV SOLN
10.0000 meq | INTRAVENOUS | Status: AC
  Administered 2015-06-02 (×4): 10 meq via INTRAVENOUS
  Filled 2015-06-02 (×4): qty 100

## 2015-06-02 MED ORDER — LEVOTHYROXINE SODIUM 75 MCG PO TABS
150.0000 ug | ORAL_TABLET | Freq: Every day | ORAL | Status: DC
  Administered 2015-06-03 – 2015-06-04 (×2): 150 ug via ORAL
  Filled 2015-06-02: qty 2
  Filled 2015-06-02 (×3): qty 1

## 2015-06-02 MED ORDER — METOPROLOL TARTRATE 1 MG/ML IV SOLN
2.5000 mg | Freq: Once | INTRAVENOUS | Status: AC
  Administered 2015-06-02: 2.5 mg via INTRAVENOUS
  Filled 2015-06-02: qty 5

## 2015-06-02 MED ORDER — POTASSIUM CHLORIDE CRYS ER 20 MEQ PO TBCR
40.0000 meq | EXTENDED_RELEASE_TABLET | Freq: Once | ORAL | Status: DC
  Filled 2015-06-02: qty 2

## 2015-06-02 MED ORDER — CIPROFLOXACIN IN D5W 400 MG/200ML IV SOLN
400.0000 mg | Freq: Two times a day (BID) | INTRAVENOUS | Status: DC
  Administered 2015-06-02 – 2015-06-04 (×5): 400 mg via INTRAVENOUS
  Filled 2015-06-02 (×6): qty 200

## 2015-06-02 MED ORDER — METOPROLOL TARTRATE 12.5 MG HALF TABLET
12.5000 mg | ORAL_TABLET | Freq: Two times a day (BID) | ORAL | Status: DC
  Administered 2015-06-02 – 2015-06-04 (×4): 12.5 mg via ORAL
  Filled 2015-06-02 (×6): qty 1

## 2015-06-02 NOTE — Progress Notes (Signed)
Pt took BiPap x 3 explained to pt she needed to leave it pt refused

## 2015-06-02 NOTE — Progress Notes (Signed)
UR COMPLETED  

## 2015-06-02 NOTE — Progress Notes (Signed)
PROGRESS NOTE  [name] JXB:[id number] DOB: 1950/08/08 DOA: 05/28/2015 PCP: Margit Hanks, MD  Brief History 64 year old female with a history of mental retardation, schizoaffective disorder, OHS, bedbound, atrial fibrillation, epilepsy, and chronic respiratory failure presented from her nursing facility secondary to hypoxia and fever. Unfortunately, the patient is a poor historian. Initial workup revealed acute kidney injury with serum creatinine 2.05 with fever of 100.16F. She was noted to be hypoxic upon presentation with oxygen saturation in the mid 80s. The patient was admitted for intravenous antibiotics and infection workup. Notably, the patient was recently discharged from [facility] after a stay from 05/13/2015 through 05/21/2015 for acute on chronic respiratory failure requiring intubation and sepsis secondary to UTI and breast cellulitis. After admission, the patient was started on vancomycin and ceftazidime with improvement of her fever. Unfortunately, the patient developed acute on chronic respiratory failure with hypercarbia and hypoxemia secondary to obtundation from infectious process and hyperammonemia in the setting of OHS/OSA. Although the patient experiences some improvement with noninvasive respiratory therapy/BiPAP, the patient becomes obtunded intermittently off of BiPAP. Pulmonary medicine was consulted to assist with management. Assessment/Plan: Fever/cellulitis of the back/infected friction ulcers -Blood cultures 2 sets--NGTD -Appreciate wound care nurse -Continue vancomycin D#5 -Continue ceftazidime D#5 -nonsurgical setting wound culture--pseudomonas--unclear clinical significance -Air mattress -Urinalysis negative for pyuria -Chest x-ray negative for infiltrates  -05/29/2015--Gen. surgery was consulted--felt the patient did not need debridement and recommended medical therapy with antibiotics and wound care  Acute on chronic respiratory failure with  hypoxia and hypercarbia -pt is normally on 2L [place] at SNF -05/30/2015--patient developed obtundation secondary to hypercarbia -Some improvement with CPAP, transferred to stepdown for BiPAP -Multifactorial including COPD/OHS -Now appears clinically volume overloaded -05/31/15--started furosemide -Aerosolized albuterol and Atrovent  -night time CPAP -do NOT titrate oxygen up for sat >94% as pt is chronic CO2 retainer -06/01/15--consulted pulmonary/CCM-->made DNR -06/02/15--weaned off BiPAP  Acute Encephalopathy -due to infection and hypercarbia, hyperammonemia -some improvement with CPAP, but moved to stepdown for Bipap -Hold chlorpromazine  -check B12--1129 -12/10--ammonia--129 -05/31/15--more awake, returned to baseline -06/01/15 AM--obtunded despite placing back on BiPAP-->consulted Pulmonary/CCM -06/01/15 ABG after 2-3hrs BiPAP--7.31/72/74/35 (0.4) -06/02/2015--mentation back to baseline, stable on 3 L  Hyperammonemia -due to chronic hepatic congestion from OHS -continue lactulose -recheck ammonia--> 176 --> 78  -may need NG tube for lactulose  Acute kidney injury -Baseline creatinine 0.7-1.0 -Continue intravenous fluids--> and creatinine improving but developed fluid overload -Suspect sepsis and volume depletion as etiology -Hold lisinopril   Acute on Chronic diastolic CHF -05/14/2015 echo EF 50-55%, PAP 40 -pt clinically fluid overloaded -Daily weights -05/21/2015 discharge weight 415 pounds  -12/11--start IV furosemide--> continue  -NEG 6 L with Lasix but remains 2.5 L positive for the admission  Atrial fibrillation With RVR -Patient went into RVR on 06/01/2015--placed on diltiazem drip -06/02/2015 spontaneous conversion to sinus rhythm -Transitioned to low dose metoprolol in setting of CHF -CHADSVASc 3 -continue ASA  schizoaffective disorder/bipolar -Continue Depakote, Thorazine -Check VPA level--53  Impaired glucose tolerance -05/14/2015 hemoglobin A1c  5.9 -Discontinue CBG checks -Discontinue insulin  Hypothyroidism -Continue Synthroid -Check TSH-3.804  Leg pain and edema -Venous duplex r/o DVT--NEG  Morbid obesity ->400 pounds and BMI of 72   Family Communication: Pt at beside Disposition Plan: remain in stepdown today.  If mental status stable and remains in sinus-->transfer to floor     Procedures/Studies: US Renal  05/29/2015  CLINICAL DATA:  Acute kidney injury. EXAM: RENAL / URINARY TRACT  ULTRASOUND COMPLETE COMPARISON:  06/20/2010 CT abdomen/ pelvis. 12/26/2009 abdominal sonogram. FINDINGS: Evaluation is significantly limited by patient related factors. Right Kidney: Length: 13.6 cm. Echogenicity within normal limits. No mass or hydronephrosis visualized. Left Kidney: Length: 12.6 cm. Echogenicity within normal limits. No mass or hydronephrosis visualized. Bladder: Appears normal for degree of bladder distention. IMPRESSION: Limited scan, with grossly normal kidneys and bladder. No hydronephrosis. Electronically Signed   By: Delbert Phenix M.D.   On: 05/29/2015 08:58   Dg Chest Port 1 View  05/30/2015  CLINICAL DATA:  Congestive heart failure EXAM: PORTABLE CHEST 1 VIEW COMPARISON:  Radiograph 05/28/2015 FINDINGS: Normal cardiac silhouette. Low lung volumes. Increase in central venous congestion. No pneumothorax. IMPRESSION: Decreased lung volumes and increased central venous congestion. Electronically Signed   By: Genevive Bi M.D.   On: 05/30/2015 15:04   Dg Chest Port 1 View  05/28/2015  CLINICAL DATA:  Hypoxia, decreased O2 sats. EXAM: PORTABLE CHEST 1 VIEW COMPARISON:  05/17/2015. FINDINGS: Trachea is midline given patient rotation. Heart is enlarged, stable. Lungs are somewhat low in volume with probable vascular crowding. No definite pleural fluid. IMPRESSION: Low lung volumes with probable vascular crowding. No definite edema. Electronically Signed   By: Leanna Battles M.D.   On: 05/28/2015 20:02   Dg Chest  Port 1 View  05/17/2015  CLINICAL DATA:  Endotracheal tube.  COPD.  Asthma EXAM: PORTABLE CHEST 1 VIEW COMPARISON:  05/16/2015 FINDINGS: Interval extubation and removal of NG tube. Stable enlarged cardiac silhouette. There is mild increase in fine airspace disease in the upper lobes. Low lung volumes. No increase in atelectasis. LEFT central venous line unchanged. IMPRESSION: 1. Stable lung volumes following extubation. 2. Slight increase in upper lobe airspace disease suggests mild pulmonary edema. Electronically Signed   By: Genevive Bi M.D.   On: 05/17/2015 07:13   Dg Chest Port 1 View  05/16/2015  CLINICAL DATA:  64 year old female currently intubated EXAM: PORTABLE CHEST 1 VIEW COMPARISON:  Prior chest x-ray 05/15/2015 FINDINGS: The tip of the endotracheal tube is 2.4 cm above the carina. A nasogastric tube is present and is coiled within the gastric body. Left subclavian approach central venous catheter remains in unchanged position with the tip overlying the superior cavoatrial junction. Persistent low inspiratory volumes with bibasilar atelectasis versus infiltrate. Stable mild cardiomegaly. Slight blurring of the interstitial markings likely secondary to patient motion. Overall, no significant interval change in the appearance of the chest. No acute osseous abnormality. IMPRESSION: 1. The tip the endotracheal tube is 2.4 cm above the carina. Other support apparatus also in satisfactory and unchanged position. 2. No significant interval change in the appearance of the lungs with persistent low inspiratory volumes and bibasilar atelectasis versus infiltrate. 3. Unchanged borderline cardiomegaly. Electronically Signed   By: Malachy Moan M.D.   On: 05/16/2015 09:29   Portable Chest Xray  05/15/2015  CLINICAL DATA:  Intubation. EXAM: PORTABLE CHEST 1 VIEW COMPARISON:  05/14/2015. FINDINGS: Endotracheal tube 3 cm above the carina. Left subclavian central line stable position. NG tube in stable  position. Mediastinum and hilar structures are unremarkable. Cardiomegaly with normal pulmonary vascularity. Bibasilar subsegmental atelectasis and/or infiltrates. Small right pleural effusion cannot be excluded. No pneumothorax . IMPRESSION: 1. Endotracheal tube 3 cm above the carina. Remaining lines and tubes in stable position. 2. Bibasilar subsegmental atelectasis and/or mild infiltrates. 3. Stable cardiomegaly . Electronically Signed   By: Maisie Fus  Register   On: 05/15/2015 07:06   Dg Chest Port 1 View  05/14/2015  CLINICAL DATA:  Central line and orogastric tube placement. Altered mental status. Initial encounter. EXAM: PORTABLE CHEST 1 VIEW COMPARISON:  Chest radiograph performed 05/13/2015 FINDINGS: The patient's endotracheal tube is seen ending just above the carina. This should be retracted 2-3 cm. An enteric tube is noted extending below the diaphragm. Patchy bilateral airspace opacification may reflect atelectasis. No definite pleural effusion or pneumothorax is seen. The cardiomediastinal silhouette is enlarged. No acute osseous abnormalities are identified. IMPRESSION: 1. Endotracheal tube seen ending just above the carina. This should be retracted 2-3 cm. 2. Patchy bilateral airspace opacification may reflect atelectasis. Cardiomegaly noted. Electronically Signed   By: Roanna Raider M.D.   On: 05/14/2015 06:24   Dg Chest Port 1 View  05/13/2015  CLINICAL DATA:  Hypoxia. Diabetes. Shortness of breath. Former smoker for 14 years. EXAM: PORTABLE CHEST 1 VIEW COMPARISON:  05/23/2013 FINDINGS: Shallow inspiration. Cardiac enlargement without significant vascular congestion. Mild volume loss in the right upper lung is probably due to shallow inspiration. No focal consolidation or airspace disease is appreciated. No blunting of costophrenic angles. No pneumothorax. Mediastinal contours appear intact. IMPRESSION: Cardiac enlargement.  Shallow inspiration.  No focal consolidation. Electronically Signed    By: Burman Nieves M.D.   On: 05/13/2015 20:06        Subjective:  patient is awake and alert. She feels hungry and wants to eat breakfast. Denies any fevers, chills, chest discomfort, shortness breath, nausea, vomiting, diarrhea, Pain.   Objective: Filed Vitals:   06/02/15 0430 06/02/15 0500 06/02/15 0600 06/02/15 0709  BP: 117/65 106/60 108/52   Pulse: 88     Temp:      TempSrc:      Resp: Height:      Weight:      SpO2: 97% 93% 98% 95%    Intake/Output Summary (Last 24 hours) at 06/02/15 0828 Last data filed at 06/02/15 0408  Gross per 24 hour  Intake  122.5 ml  Output   3375 ml  Net -3252.5 ml   Weight change:  Exam:   General:  Pt is alert, follows commands appropriately, not in acute distress  HEENT: No icterus, No thrush, No neck mass, [place]/AT  Cardiovascular: RRR, S1/S2, no rubs, no gallops  Respiratory: bibasilar rales. No wheezing. Good air movement  Abdomen: Soft/+BS, non tender, non distended, no guarding  Extremities: 2+ edema, No lymphangitis, No petechiae, No rashes, no synovitis  Data Reviewed: Basic Metabolic Panel:  Recent Labs Lab 05/29/15 0453 05/30/15 0600 05/31/15 0354 06/01/15 0256 06/02/15 0615  NA 139 140 141 144 142  K 3.5 3.9 3.6 3.4* 3.1*  CL 98* 102 104 102 97*  CO2 36* 32 33* 36* 38*  GLUCOSE 91 87 95 81 106*  BUN 8 5* 6 <5* <5*  CREATININE 1.90* 1.53* 1.25* 0.96 0.89  CALCIUM 8.1* 7.9* 8.4* 8.9 9.2  MG 1.6*  --   --   --  1.6*  PHOS 4.7*  --   --   --   --    Liver Function Tests:  Recent Labs Lab 05/28/15 1948  AST 14*  ALT 9*  ALKPHOS 55  BILITOT 0.4  PROT 6.2*  ALBUMIN 2.9*   No results for input(s): LIPASE, AMYLASE in the last 168 hours.  Recent Labs Lab 05/30/15 1310 06/02/15 0615  AMMONIA 176* 78*   CBC:  Recent Labs Lab 05/28/15 1948 05/29/15 0453 05/30/15 0600 05/31/15 0354 06/01/15 0256  WBC 6.4 6.0 6.1 5.4 4.3  NEUTROABS 3.7  --   --   --   --   HGB 10.6* 8.7* 9.1*  9.6* 9.9*  HCT 35.9* 29.6* 31.9* 33.2* 35.3*  MCV 100.6* 101.7* 104.6* 103.8* 103.2*  PLT 257 214 233 210 248   Cardiac Enzymes: No results for input(s): CKTOTAL, CKMB, CKMBINDEX, TROPONINI in the last 168 hours. BNP: Invalid input(s): POCBNP CBG:  Recent Labs Lab 05/30/15 05/30/15 0418 05/30/15 0752 05/30/15 1205 06/01/15 1206  GLUCAP 95 103* 86 95 72    Recent Results (from the past 240 hour(s))  Blood Culture (routine x 2)     Status: None (Preliminary result)   Collection Time: 05/28/15  7:48 PM  Result Value Ref Range Status   Specimen Description BLOOD RIGHT ANTECUBITAL  Final   Special Requests BOTTLES DRAWN AEROBIC AND ANAEROBIC 5CC  Final   Culture NO GROWTH 4 DAYS  Final   Report Status PENDING  Incomplete  Blood Culture (routine x 2)     Status: None (Preliminary result)   Collection Time: 05/28/15  8:03 PM  Result Value Ref Range Status   Specimen Description BLOOD LEFT ANTECUBITAL  Final   Special Requests BOTTLES DRAWN AEROBIC AND ANAEROBIC 5CC  Final   Culture NO GROWTH 4 DAYS  Final   Report Status PENDING  Incomplete  Urine culture     Status: None   Collection Time: 05/28/15  8:47 PM  Result Value Ref Range Status   Specimen Description URINE, CATHETERIZED  Final   Special Requests NONE  Final   Culture >=100,000 COLONIES/mL YEAST  Final   Report Status 05/30/2015 FINAL  Final  Wound culture     Status: None   Collection Time: 05/29/15  2:32 AM  Result Value Ref Range Status   Specimen Description ULCER  Final   Special Requests Immunocompromised  Final   Gram Stain   Final    RARE WBC PRESENT, PREDOMINANTLY PMN RARE SQUAMOUS EPITHELIAL CELLS PRESENT FEW GRAM POSITIVE COCCI IN PAIRS IN CLUSTERS RARE GRAM NEGATIVE RODS Performed at [facility]    Culture   Final    MODERATE PSEUDOMONAS AERUGINOSA Performed at [facility]    Report Status 06/02/2015 FINAL  Final   Organism ID, Bacteria PSEUDOMONAS AERUGINOSA  Final       Susceptibility   Pseudomonas aeruginosa - MIC*    CEFEPIME 8 SENSITIVE Sensitive     CEFTAZIDIME 32 RESISTANT Resistant     CIPROFLOXACIN <=0.25 SENSITIVE Sensitive     GENTAMICIN <=1 SENSITIVE Sensitive     IMIPENEM 2 SENSITIVE Sensitive     TOBRAMYCIN <=1 SENSITIVE Sensitive     * MODERATE PSEUDOMONAS AERUGINOSA  MRSA PCR Screening     Status: None   Collection Time: 05/29/15  2:32 AM  Result Value Ref Range Status   MRSA by PCR NEGATIVE NEGATIVE Final    Comment:        The GeneXpert MRSA Assay (FDA approved for NASAL specimens only), is one component of a comprehensive MRSA colonization surveillance program. It is not intended to diagnose MRSA infection nor to guide or monitor treatment for MRSA infections.      Scheduled Meds: . antiseptic oral rinse  7 mL Mouth Rinse q12n4p  . aspirin  81 mg Oral Daily  . cefTAZidime (FORTAZ)  IV  2 g Intravenous 3 times per day  . chlorhexidine  15 mL Mouth Rinse BID  . collagenase   Topical Daily  . divalproex  1,250 mg Oral QHS  .  feeding supplement (GLUCERNA SHAKE)  237 mL Oral TID BM  . feeding supplement (PRO-STAT SUGAR FREE 64)  30 mL Oral TID  . furosemide  40 mg Intravenous BID  . heparin  5,000 Units Subcutaneous 3 times per day  . ipratropium-albuterol  3 mL Nebulization Q6H  . lactulose  20 g Oral TID  . lamoTRIgine  100 mg Oral BID  . levothyroxine  75 mcg Intravenous QAC breakfast  . metoprolol tartrate  12.5 mg Oral BID  . nicotine  21 mg Transdermal Daily  . pantoprazole  40 mg Oral Daily  . sodium chloride  3 mL Intravenous Q12H  . vancomycin  1,250 mg Intravenous Q12H   Continuous Infusions: . diltiazem (CARDIZEM) infusion 5 mg/hr (06/02/15 0600)     Tekeya Geffert, DO  [facility] Pager [phone]  If 7PM-7AM, please contact night-coverage [email or web address] Password [email or web address] 06/02/2015, 8:28 AM   LOS: 5 days

## 2015-06-02 NOTE — Procedures (Signed)
Pt taken off BiPAP and placed on [place] by RN.  Pt is resting well.

## 2015-06-02 NOTE — Care Management Important Message (Signed)
Important Message  Patient Details  Name: [name] MRN: [id number] Date of Birth: [birth year]   Medicare Important Message Given:  Yes    Kyla Balzarine 06/02/2015, 9:55 AM

## 2015-06-03 LAB — BASIC METABOLIC PANEL
Anion gap: 6 (ref 5–15)
BUN: <5 mg/dL — ABNORMAL LOW (ref 6–20)
CHLORIDE: 94 mmol/L — AB (ref 101–111)
CO2: 40 mmol/L — ABNORMAL HIGH (ref 22–32)
CREATININE: 0.85 mg/dL (ref 0.44–1.00)
Calcium: 9.3 mg/dL (ref 8.9–10.3)
Glucose, Bld: 86 mg/dL (ref 65–99)
Potassium: 3.3 mmol/L — ABNORMAL LOW (ref 3.5–5.1)
SODIUM: 140 mmol/L (ref 135–145)

## 2015-06-03 LAB — MAGNESIUM: MAGNESIUM: 1.4 mg/dL — AB (ref 1.7–2.4)

## 2015-06-03 MED ORDER — MAGNESIUM SULFATE 2 GM/50ML IV SOLN
2.0000 g | Freq: Once | INTRAVENOUS | Status: AC
  Administered 2015-06-03: 2 g via INTRAVENOUS
  Filled 2015-06-03: qty 50

## 2015-06-03 MED ORDER — POTASSIUM CHLORIDE 10 MEQ/100ML IV SOLN
10.0000 meq | INTRAVENOUS | Status: AC
  Administered 2015-06-03 (×2): 10 meq via INTRAVENOUS
  Filled 2015-06-03 (×2): qty 100

## 2015-06-03 MED ORDER — IPRATROPIUM-ALBUTEROL 0.5-2.5 (3) MG/3ML IN SOLN
3.0000 mL | Freq: Three times a day (TID) | RESPIRATORY_TRACT | Status: DC
  Administered 2015-06-04 (×2): 3 mL via RESPIRATORY_TRACT
  Filled 2015-06-03 (×2): qty 3

## 2015-06-03 NOTE — Progress Notes (Signed)
PROGRESS NOTE  [name] XBM:[id number] DOB: 09-01-1950 DOA: 05/28/2015 PCP: Margit Hanks, MD Brief History 64 year old female with a history of mental retardation, schizoaffective disorder, OHS, bedbound, atrial fibrillation, epilepsy, and chronic respiratory failure presented from her nursing facility secondary to hypoxia and fever. Unfortunately, the patient is a poor historian. Initial workup revealed acute kidney injury with serum creatinine 2.05 with fever of 100.97F. She was noted to be hypoxic upon presentation with oxygen saturation in the mid 80s. The patient was admitted for intravenous antibiotics and infection workup. Notably, the patient was recently discharged from [facility] after a stay from 05/13/2015 through 05/21/2015 for acute on chronic respiratory failure requiring intubation and sepsis secondary to UTI and breast cellulitis. After admission, the patient was started on vancomycin and ceftazidime with improvement of her fever. Unfortunately, the patient developed acute on chronic respiratory failure with hypercarbia and hypoxemia secondary to obtundation from infectious process and hyperammonemia in the setting of OHS/OSA. Although the patient experiences some improvement with noninvasive respiratory therapy/BiPAP, the patient becomes obtunded intermittently off of BiPAP. Pulmonary medicine was consulted to assist with management, but felt that the patient's obtundation was also contributed to by the patient's hyperammonemia. After talking to the patient's sister, the patient was made DO NOT RESUSCITATE. Assessment/Plan: Fever/cellulitis of the back/infected friction ulcers -Blood cultures 2 sets--NGTD -Appreciate wound care nurse -Continue vancomycin D#6 of 7 -Continue cipro--d/c after 06/04/15 dose -nonsurgical setting wound culture--pseudomonas--unclear clinical significance -Air mattress -Urinalysis negative for pyuria -Chest x-ray negative for infiltrates   -05/29/2015--Gen. surgery was consulted--felt the patient did not need debridement and recommended medical therapy with antibiotics and wound care  Acute on chronic respiratory failure with hypoxia and hypercarbia -pt is normally on 2L [place] at SNF -05/30/2015--patient developed obtundation secondary to hypercarbia -Some improvement with CPAP, transferred to stepdown for BiPAP -Multifactorial including COPD/OHS and CHF -Now appears clinically volume overloaded -05/31/15--started furosemide -Aerosolized albuterol and Atrovent  -night time CPAP -do NOT titrate oxygen up for sat >94% as pt is chronic CO2 retainer -06/01/15--consulted pulmonary/CCM-->made DNR -06/02/15--weaned off BiPAP but still requires intermittenly at night  Acute Encephalopathy -due to infection and hypercarbia, hyperammonemia -some improvement with CPAP, but moved to stepdown for Bipap -Hold chlorpromazine  -check B12--1129 -12/10--ammonia--129 -05/31/15--more awake, returned to baseline -06/01/15 AM--obtunded despite placing back on BiPAP-->consulted Pulmonary/CCM -06/01/15 ABG after 2-3hrs BiPAP--7.31/72/74/35 (0.4) -06/03/2015--mentation back to baseline, stable on 3 L  Hyperammonemia -due to chronic hepatic congestion from OHS -continue lactulose -recheck ammonia--> 176 --> 78  -may need NG tube for lactulose  Acute kidney injury -Baseline creatinine 0.7-1.0 -developed fluid overload-->renal function stable with lasix 40 mg IV bid -Suspect sepsis and volume depletion as etiology -Hold lisinopril   Acute on Chronic diastolic CHF -05/14/2015 echo EF 50-55%, PAP 40 -pt clinically fluid overloaded -Daily weights -05/21/2015 discharge weight 415 pounds  -12/11--start IV furosemide--> continue  -NEG 10 L with Lasix--now with neg fluid balance for the admission  Atrial fibrillation With RVR -Patient went into RVR on 06/01/2015--placed on diltiazem drip -06/02/2015 spontaneous conversion to sinus  rhythm -Transitioned to low dose metoprolol in setting of CHF -CHADSVASc 3 -continue ASA  schizoaffective disorder/bipolar -Continue Depakote, Thorazine -Check VPA level--53  Impaired glucose tolerance -05/14/2015 hemoglobin A1c 5.9 -Discontinue CBG checks -Discontinue insulin  Hypothyroidism -Continue Synthroid -Check TSH-3.804  Leg pain and edema -Venous duplex r/o DVT--NEG  Morbid obesity ->400 pounds and BMI of 72  Goals of care -Patient is DO  NOT RESUSCITATE -Appreciate Palliative medicine  -Ultimate plan is to discharge patient to residential hospice hospice with comfort care measures only and discontinue all medications with curative intent -Palliative trying to coordinated transfer to [facility]   Family Communication: Pt at beside Disposition Plan: remain in stepdown pt still needs intermitten BiPAP;  Residential hospice in 1-2 days          Procedures/Studies: US Renal  05/29/2015  CLINICAL DATA:  Acute kidney injury. EXAM: RENAL / URINARY TRACT ULTRASOUND COMPLETE COMPARISON:  06/20/2010 CT abdomen/ pelvis. 12/26/2009 abdominal sonogram. FINDINGS: Evaluation is significantly limited by patient related factors. Right Kidney: Length: 13.6 cm. Echogenicity within normal limits. No mass or hydronephrosis visualized. Left Kidney: Length: 12.6 cm. Echogenicity within normal limits. No mass or hydronephrosis visualized. Bladder: Appears normal for degree of bladder distention. IMPRESSION: Limited scan, with grossly normal kidneys and bladder. No hydronephrosis. Electronically Signed   By: Delbert Phenix M.D.   On: 05/29/2015 08:58   Dg Chest Port 1 View  05/30/2015  CLINICAL DATA:  Congestive heart failure EXAM: PORTABLE CHEST 1 VIEW COMPARISON:  Radiograph 05/28/2015 FINDINGS: Normal cardiac silhouette. Low lung volumes. Increase in central venous congestion. No pneumothorax. IMPRESSION: Decreased lung volumes and increased central venous congestion.  Electronically Signed   By: Genevive Bi M.D.   On: 05/30/2015 15:04   Dg Chest Port 1 View  05/28/2015  CLINICAL DATA:  Hypoxia, decreased O2 sats. EXAM: PORTABLE CHEST 1 VIEW COMPARISON:  05/17/2015. FINDINGS: Trachea is midline given patient rotation. Heart is enlarged, stable. Lungs are somewhat low in volume with probable vascular crowding. No definite pleural fluid. IMPRESSION: Low lung volumes with probable vascular crowding. No definite edema. Electronically Signed   By: Leanna Battles M.D.   On: 05/28/2015 20:02   Dg Chest Port 1 View  05/17/2015  CLINICAL DATA:  Endotracheal tube.  COPD.  Asthma EXAM: PORTABLE CHEST 1 VIEW COMPARISON:  05/16/2015 FINDINGS: Interval extubation and removal of NG tube. Stable enlarged cardiac silhouette. There is mild increase in fine airspace disease in the upper lobes. Low lung volumes. No increase in atelectasis. LEFT central venous line unchanged. IMPRESSION: 1. Stable lung volumes following extubation. 2. Slight increase in upper lobe airspace disease suggests mild pulmonary edema. Electronically Signed   By: Genevive Bi M.D.   On: 05/17/2015 07:13   Dg Chest Port 1 View  05/16/2015  CLINICAL DATA:  64 year old female currently intubated EXAM: PORTABLE CHEST 1 VIEW COMPARISON:  Prior chest x-ray 05/15/2015 FINDINGS: The tip of the endotracheal tube is 2.4 cm above the carina. A nasogastric tube is present and is coiled within the gastric body. Left subclavian approach central venous catheter remains in unchanged position with the tip overlying the superior cavoatrial junction. Persistent low inspiratory volumes with bibasilar atelectasis versus infiltrate. Stable mild cardiomegaly. Slight blurring of the interstitial markings likely secondary to patient motion. Overall, no significant interval change in the appearance of the chest. No acute osseous abnormality. IMPRESSION: 1. The tip the endotracheal tube is 2.4 cm above the carina. Other support  apparatus also in satisfactory and unchanged position. 2. No significant interval change in the appearance of the lungs with persistent low inspiratory volumes and bibasilar atelectasis versus infiltrate. 3. Unchanged borderline cardiomegaly. Electronically Signed   By: Malachy Moan M.D.   On: 05/16/2015 09:29   Portable Chest Xray  05/15/2015  CLINICAL DATA:  Intubation. EXAM: PORTABLE CHEST 1 VIEW COMPARISON:  05/14/2015. FINDINGS: Endotracheal tube 3 cm above the carina. Left  subclavian central line stable position. NG tube in stable position. Mediastinum and hilar structures are unremarkable. Cardiomegaly with normal pulmonary vascularity. Bibasilar subsegmental atelectasis and/or infiltrates. Small right pleural effusion cannot be excluded. No pneumothorax . IMPRESSION: 1. Endotracheal tube 3 cm above the carina. Remaining lines and tubes in stable position. 2. Bibasilar subsegmental atelectasis and/or mild infiltrates. 3. Stable cardiomegaly . Electronically Signed   By: Maisie Fus  Register   On: 05/15/2015 07:06   Dg Chest Port 1 View  05/14/2015  CLINICAL DATA:  Central line and orogastric tube placement. Altered mental status. Initial encounter. EXAM: PORTABLE CHEST 1 VIEW COMPARISON:  Chest radiograph performed 05/13/2015 FINDINGS: The patient's endotracheal tube is seen ending just above the carina. This should be retracted 2-3 cm. An enteric tube is noted extending below the diaphragm. Patchy bilateral airspace opacification may reflect atelectasis. No definite pleural effusion or pneumothorax is seen. The cardiomediastinal silhouette is enlarged. No acute osseous abnormalities are identified. IMPRESSION: 1. Endotracheal tube seen ending just above the carina. This should be retracted 2-3 cm. 2. Patchy bilateral airspace opacification may reflect atelectasis. Cardiomegaly noted. Electronically Signed   By: Roanna Raider M.D.   On: 05/14/2015 06:24   Dg Chest Port 1 View  05/13/2015   CLINICAL DATA:  Hypoxia. Diabetes. Shortness of breath. Former smoker for 14 years. EXAM: PORTABLE CHEST 1 VIEW COMPARISON:  05/23/2013 FINDINGS: Shallow inspiration. Cardiac enlargement without significant vascular congestion. Mild volume loss in the right upper lung is probably due to shallow inspiration. No focal consolidation or airspace disease is appreciated. No blunting of costophrenic angles. No pneumothorax. Mediastinal contours appear intact. IMPRESSION: Cardiac enlargement.  Shallow inspiration.  No focal consolidation. Electronically Signed   By: Burman Nieves M.D.   On: 05/13/2015 20:06        Subjective: Patient denies fevers, chills, headache, chest pain, dyspnea, nausea, vomiting, diarrhea, abdominal pain, dysuria, hematuria   Objective: Filed Vitals:   06/03/15 0751 06/03/15 0800 06/03/15 1111 06/03/15 1413  BP:  121/66 120/80   Pulse:   81   Temp:  98.5 F (36.9 C) 98.9 F (37.2 C)   TempSrc:  Axillary Axillary   Resp:  27    Height:      Weight:      SpO2: 95% 94%  94%    Intake/Output Summary (Last 24 hours) at 06/03/15 1453 Last data filed at 06/03/15 1400  Gross per 24 hour  Intake   1050 ml  Output   4650 ml  Net  -3600 ml   Weight change:  Exam:   General:  Pt is alert, follows commands appropriately, not in acute distress  HEENT: No icterus, No thrush, No neck mass, [place]/AT  Cardiovascular: RRR, S1/S2, no rubs, no gallops  Respiratory: Bibasilar crackles. No wheeze. Good air movement  Abdomen: Soft/+BS, non tender, non distended, no guarding  Extremities: 2+LE edema, No lymphangitis, No petechiae, No rashes, no synovitis  Data Reviewed: Basic Metabolic Panel:  Recent Labs Lab 05/29/15 0453 05/30/15 0600 05/31/15 0354 06/01/15 0256 06/02/15 0615 06/03/15 0406  NA 139 140 141 144 142 140  K 3.5 3.9 3.6 3.4* 3.1* 3.3*  CL 98* 102 104 102 97* 94*  CO2 36* 32 33* 36* 38* 40*  GLUCOSE 91 87 95 81 106* 86  BUN 8 5* 6 <5* <5* <5*    CREATININE 1.90* 1.53* 1.25* 0.96 0.89 0.85  CALCIUM 8.1* 7.9* 8.4* 8.9 9.2 9.3  MG 1.6*  --   --   --  1.6* 1.4*  PHOS 4.7*  --   --   --   --   --    Liver Function Tests:  Recent Labs Lab 05/28/15 1948  AST 14*  ALT 9*  ALKPHOS 55  BILITOT 0.4  PROT 6.2*  ALBUMIN 2.9*   No results for input(s): LIPASE, AMYLASE in the last 168 hours.  Recent Labs Lab 05/30/15 1310 06/02/15 0615  AMMONIA 176* 78*   CBC:  Recent Labs Lab 05/28/15 1948 05/29/15 0453 05/30/15 0600 05/31/15 0354 06/01/15 0256  WBC 6.4 6.0 6.1 5.4 4.3  NEUTROABS 3.7  --   --   --   --   HGB 10.6* 8.7* 9.1* 9.6* 9.9*  HCT 35.9* 29.6* 31.9* 33.2* 35.3*  MCV 100.6* 101.7* 104.6* 103.8* 103.2*  PLT 257 214 233 210 248   Cardiac Enzymes: No results for input(s): CKTOTAL, CKMB, CKMBINDEX, TROPONINI in the last 168 hours. BNP: Invalid input(s): POCBNP CBG:  Recent Labs Lab 05/30/15 05/30/15 0418 05/30/15 0752 05/30/15 1205 06/01/15 1206  GLUCAP 95 103* 86 95 72    Recent Results (from the past 240 hour(s))  Blood Culture (routine x 2)     Status: None   Collection Time: 05/28/15  7:48 PM  Result Value Ref Range Status   Specimen Description BLOOD RIGHT ANTECUBITAL  Final   Special Requests BOTTLES DRAWN AEROBIC AND ANAEROBIC 5CC  Final   Culture NO GROWTH 5 DAYS  Final   Report Status 06/02/2015 FINAL  Final  Blood Culture (routine x 2)     Status: None   Collection Time: 05/28/15  8:03 PM  Result Value Ref Range Status   Specimen Description BLOOD LEFT ANTECUBITAL  Final   Special Requests BOTTLES DRAWN AEROBIC AND ANAEROBIC 5CC  Final   Culture NO GROWTH 5 DAYS  Final   Report Status 06/02/2015 FINAL  Final  Urine culture     Status: None   Collection Time: 05/28/15  8:47 PM  Result Value Ref Range Status   Specimen Description URINE, CATHETERIZED  Final   Special Requests NONE  Final   Culture >=100,000 COLONIES/mL YEAST  Final   Report Status 05/30/2015 FINAL  Final  Wound  culture     Status: None   Collection Time: 05/29/15  2:32 AM  Result Value Ref Range Status   Specimen Description ULCER  Final   Special Requests Immunocompromised  Final   Gram Stain   Final    RARE WBC PRESENT, PREDOMINANTLY PMN RARE SQUAMOUS EPITHELIAL CELLS PRESENT FEW GRAM POSITIVE COCCI IN PAIRS IN CLUSTERS RARE GRAM NEGATIVE RODS Performed at [facility]    Culture   Final    MODERATE PSEUDOMONAS AERUGINOSA Performed at [facility]    Report Status 06/02/2015 FINAL  Final   Organism ID, Bacteria PSEUDOMONAS AERUGINOSA  Final      Susceptibility   Pseudomonas aeruginosa - MIC*    CEFEPIME 8 SENSITIVE Sensitive     CEFTAZIDIME 32 RESISTANT Resistant     CIPROFLOXACIN <=0.25 SENSITIVE Sensitive     GENTAMICIN <=1 SENSITIVE Sensitive     IMIPENEM 2 SENSITIVE Sensitive     TOBRAMYCIN <=1 SENSITIVE Sensitive     * MODERATE PSEUDOMONAS AERUGINOSA  MRSA PCR Screening     Status: None   Collection Time: 05/29/15  2:32 AM  Result Value Ref Range Status   MRSA by PCR NEGATIVE NEGATIVE Final    Comment:        The GeneXpert MRSA Assay (  FDA approved for NASAL specimens only), is one component of a comprehensive MRSA colonization surveillance program. It is not intended to diagnose MRSA infection nor to guide or monitor treatment for MRSA infections.      Scheduled Meds: . antiseptic oral rinse  7 mL Mouth Rinse q12n4p  . aspirin  81 mg Oral Daily  . chlorhexidine  15 mL Mouth Rinse BID  . ciprofloxacin  400 mg Intravenous Q12H  . collagenase   Topical Daily  . divalproex  1,250 mg Oral QHS  . feeding supplement (GLUCERNA SHAKE)  237 mL Oral TID BM  . feeding supplement (PRO-STAT SUGAR FREE 64)  30 mL Oral TID  . furosemide  40 mg Intravenous BID  . heparin  5,000 Units Subcutaneous 3 times per day  . ipratropium-albuterol  3 mL Nebulization Q6H  . lactulose  20 g Oral TID  . lamoTRIgine  100 mg Oral BID  . levothyroxine  150 mcg Oral QAC  breakfast  . magnesium sulfate 1 - 4 g bolus IVPB  2 g Intravenous Once  . metoprolol tartrate  12.5 mg Oral BID  . nicotine  21 mg Transdermal Daily  . pantoprazole  40 mg Oral Daily  . potassium chloride  10 mEq Intravenous Q1 Hr x 2  . sodium chloride  3 mL Intravenous Q12H  . vancomycin  1,250 mg Intravenous Q12H   Continuous Infusions:    Anaiyah Anglemyer, DO  [facility] Pager [phone]  If 7PM-7AM, please contact night-coverage [email or web address] Password [email or web address] 06/03/2015, 2:53 PM   LOS: 6 days

## 2015-06-03 NOTE — Consult Note (Signed)
Call placed to sister Elease Hashimoto, number added to contacts in chart.  Elease Hashimoto lives in [place], visited yesterday, have not found her in the room today for discussion.  I will continue to attempt contact.  Recommendation would be for hospice at her SNF.  The patient has dual medicare/medicaid coverage therefore is eligible for hospice in addition to custodial services at [facility].  I light of her mental disability I believe that it would be not be in her best interest to changer her residence or caregivers.  She is familiar with her surroundings and I think hospice services will be an additional support layer at her SNF, managing symptoms and hopefully preventing re-hospitalization if this is indeed the goal of care that is established with the next of kin, Elease Hashimoto.  Will continue to follow and assist with goal identification and transition.  Cindra Presume, RN-BC, MSN, [facility]

## 2015-06-03 NOTE — Progress Notes (Signed)
Pt awake, stated her name, and said she "wanted some snuff" and that her "doctor told me I could have it".   Taking POs without difficutly.

## 2015-06-03 NOTE — Clinical Social Work Note (Signed)
CSW received call from Marylene Land, nurse case manager regarding d/c disposition for patient. Palliative and MD notes reviewed and attending MD talked with sister and is recommending hospice in [place], [place], where sister Pepper Gramling lives. Call made to Ms. Giambalvo [phone]) and message left.   Genelle Bal, MSW, LCSW Licensed Clinical Social Worker Clinical Social Work Department [employer] [phone]

## 2015-06-03 NOTE — Progress Notes (Signed)
Pt is agitated and refusing BiPAP at this time. RN suggested trying to place pt on BiPAP after RN has given bath and see if pt has calmed down. RT will continue to monitor. Pt on nasal cannula at this time and is tolerating well with all vital signs within normal range

## 2015-06-04 DIAGNOSIS — F259 Schizoaffective disorder, unspecified: Secondary | ICD-10-CM

## 2015-06-04 DIAGNOSIS — N179 Acute kidney failure, unspecified: Secondary | ICD-10-CM

## 2015-06-04 LAB — BLOOD GAS, ARTERIAL
Acid-Base Excess: 18.7 mmol/L — ABNORMAL HIGH (ref 0.0–2.0)
BICARBONATE: 43.6 meq/L — AB (ref 20.0–24.0)
DRAWN BY: 257081
FIO2: 0.21
O2 Saturation: 85.6 %
PATIENT TEMPERATURE: 98.6
PH ART: 7.503 — AB (ref 7.350–7.450)
PO2 ART: 48.7 mmHg — AB (ref 80.0–100.0)
TCO2: 45.4 mmol/L (ref 0–100)
pCO2 arterial: 56 mmHg — ABNORMAL HIGH (ref 35.0–45.0)

## 2015-06-04 LAB — BASIC METABOLIC PANEL
ANION GAP: 10 (ref 5–15)
BUN: 6 mg/dL (ref 6–20)
CHLORIDE: 92 mmol/L — AB (ref 101–111)
CO2: 38 mmol/L — ABNORMAL HIGH (ref 22–32)
Calcium: 9.3 mg/dL (ref 8.9–10.3)
Creatinine, Ser: 0.89 mg/dL (ref 0.44–1.00)
GFR calc Af Amer: >60 mL/min (ref >60)
Glucose, Bld: 86 mg/dL (ref 65–99)
POTASSIUM: 3.3 mmol/L — AB (ref 3.5–5.1)
SODIUM: 140 mmol/L (ref 135–145)

## 2015-06-04 LAB — AMMONIA: AMMONIA: 80 umol/L — AB (ref 9–35)

## 2015-06-04 LAB — MAGNESIUM: MAGNESIUM: 1.6 mg/dL — AB (ref 1.7–2.4)

## 2015-06-04 MED ORDER — POTASSIUM CHLORIDE CRYS ER 20 MEQ PO TBCR
40.0000 meq | EXTENDED_RELEASE_TABLET | Freq: Once | ORAL | Status: AC
  Administered 2015-06-04: 40 meq via ORAL
  Filled 2015-06-04: qty 2

## 2015-06-04 MED ORDER — LACTULOSE 10 GM/15ML PO SOLN: 20.0000 g | Freq: Three times a day (TID) | ORAL | Status: AC

## 2015-06-04 NOTE — Progress Notes (Signed)
All wounds on back were dressed with Santyl and covered with Allevyn foam dressings.

## 2015-06-04 NOTE — Progress Notes (Signed)
RT was asked to document current BiPAP settings. Patient wears BiPAP QHS. Current settings are 15/6n with an FIO2 of 40%.

## 2015-06-04 NOTE — Discharge Summary (Addendum)
Physician Discharge Summary  [name] ZOX:[id number] DOB: [birth year] DOA: 05/28/2015  PCP: Jennifer Hanks, MD  Admit date: 05/28/2015 Discharge date: 06/04/2015  Time spent: 50 minutes  Recommendations for Outpatient Follow-up:  1. Will need air mattress- continue wound care 2. BiPAP QHS- settings 15/6 - keep pulse ox 88-92% and not more 3. DO NOT ALLOW PULSE OX TO RISE > 92% AS SHE RETAINS CO2  Discharge Condition: stable    Discharge Diagnoses:  Principal Problem:   Sepsis (HCC) Active Problems:   Cellulitis and abscess of trunk   Acute on chronic respiratory failure with hypoxia and hypercapnia (HCC)   GERD   Morbid obesity (HCC)   Hypothyroidism   Diabetes mellitus (HCC)   COPD (chronic obstructive pulmonary disease) (HCC)   Pressure ulcer   CHF with unknown LVEF (HCC)   AKI (acute kidney injury) (HCC)   Schizoaffective disorder (HCC)   History of present illness:  64 year old female with a history of mental retardation, schizoaffective disorder, OHS, bedbound, atrial fibrillation, epilepsy, and chronic respiratory failure presented from her nursing facility secondary to hypoxia and fever. Unfortunately, the patient is a poor historian. Initial workup revealed acute kidney injury with serum creatinine 2.05 with fever of 100.27F. She was noted to be hypoxic upon presentation with oxygen saturation in the mid 80s. The patient was admitted for intravenous antibiotics and infection workup. Notably, the patient was recently discharged from [facility] after a stay from 05/13/2015 through 05/21/2015 for acute on chronic respiratory failure requiring intubation and sepsis secondary to UTI and breast cellulitis. After admission, the patient was started on vancomycin and ceftazidime with improvement of her fever. Unfortunately, the patient developed acute on chronic respiratory failure with hypercarbia and hypoxemia secondary to obtundation from infectious process and hyperammonemia in  the setting of OHS/OSA. Although the patient experiences some improvement with noninvasive respiratory therapy/BiPAP, the patient becomes obtunded intermittently off of BiPAP. Pulmonary medicine was consulted to assist with management, but felt that the patient's obtundation was also contributed to by the patient's hyperammonemia. After talking to the patient's sister, the patient was made DO NOT RESUSCITATE.  Goals of care -Patient is DO NOT RESUSCITATE - return to SNF with hospice care- avoid medications which are not for comfort  Hospital Course:  Fever/cellulitis of the back/infected friction ulcers -Blood cultures 2 sets--NGTD -Appreciate wound care nurse -has received vancomycin, Ceftaz and cipro--today is day 7 of treatment- will d/c today- continue wound care at facility -nonsurgical setting wound culture--pseudomonas--unclear clinical significance -Air mattress -Urinalysis negative for pyuria -Chest x-ray negative for infiltrates  -05/29/2015--Gen. surgery was consulted--felt the patient did not need debridement and recommended medical therapy with antibiotics and wound care  Acute on chronic respiratory failure with hypoxia and hypercarbia -pt is normally on 2L [place] at SNF -05/30/2015--patient developed obtundation secondary to hypercarbia -Some improvement with CPAP, transferred to stepdown for BiPAP -Multifactorial including COPD/OHS and CHF -Now appears clinically volume overloaded -05/31/15--started furosemide -Aerosolized albuterol and Atrovent  -do NOT titrate oxygen up for sat >94% as pt is chronic CO2 retainer -06/01/15--consulted pulmonary/CCM-->made DNR -06/02/15--weaned off BiPAP but still requires intermittenly at night  Acute Encephalopathy- underlying schizoaffective disorder -due to infection and hypercarbia, hyperammonemia- alert now but quite restless and confused  Hyperammonemia -due to chronic hepatic congestion from OHS -continue lactulose -recheck  ammonia--> 176 >> 78 >>80 -may need NG tube for lactulose  Acute kidney injury -Baseline creatinine 0.7-1.0 -developed fluid overload-->renal function stable with lasix 40 mg IV bid -Suspect sepsis and  volume depletion as etiology -Holding lisinopril   Acute on Chronic diastolic CHF -05/14/2015 echo EF 50-55%, PAP 40 -pt clinically fluid overloaded -Daily weights -05/21/2015 discharge weight 415 pounds  -12/11--start IV furosemide--> continue  -NEG 10 L with Lasix--now with neg fluid balance for the admission  Atrial fibrillation With RVR -Patient went into RVR on 06/01/2015--placed on diltiazem drip -06/02/2015 spontaneous conversion to sinus rhythm -Transitioned to low dose metoprolol in setting of CHF -CHADSVASc 3 -continue ASA  Schizoaffective disorder/bipolar -Continue Depakote, Thorazine -Check VPA level--53  Impaired glucose tolerance -05/14/2015 hemoglobin A1c 5.9 -Discontinue CBG checks  Hypothyroidism -Continue Synthroid - TSH-3.804  Leg pain and edema -Venous duplex r/o DVT--NEG  Morbid obesity ->400 pounds and BMI of 72    Consultations:  Palliative care  Discharge Exam: Filed Weights   05/28/15 1937 05/29/15 2002 05/31/15 1244  Weight: 185.521 kg (409 lb) 182.8 kg (403 lb) 185.975 kg (410 lb)   Filed Vitals:   06/04/15 0722 06/04/15 1116  BP: 105/60 122/64  Pulse: 79   Temp: 98.3 F (36.8 C) 98.7 F (37.1 C)  Resp:  16    General: AAO x 3, no distress Cardiovascular: RRR, no murmurs  Respiratory: clear to auscultation bilaterally GI: soft, non-tender, non-distended, bowel sound positive  Discharge Instructions You were cared for by a hospitalist during your hospital stay. If you have any questions about your discharge medications or the care you received while you were in the hospital after you are discharged, you can call the unit and asked to speak with the hospitalist on call if the hospitalist that took care of you is not  available. Once you are discharged, your primary care physician will handle any further medical issues. Please note that NO REFILLS for any discharge medications will be authorized once you are discharged, as it is imperative that you return to your primary care physician (or establish a relationship with a primary care physician if you do not have one) for your aftercare needs so that they can reassess your need for medications and monitor your lab values.      Discharge Instructions    Diet - low sodium heart healthy    Complete by:  As directed      Increase activity slowly    Complete by:  As directed             Medication List    STOP taking these medications        aspirin 81 MG chewable tablet     feeding supplement (PRO-STAT SUGAR FREE 64) Liqd     lisinopril 5 MG tablet  Commonly known as:  PRINIVIL,ZESTRIL     nicotine 21 mg/24hr patch  Commonly known as:  NICODERM CQ - dosed in mg/24 hours     omeprazole 40 MG capsule  Commonly known as:  PRILOSEC      TAKE these medications        bisacodyl 10 MG suppository  Commonly known as:  DULCOLAX  Place 10 mg rectally every 3 (three) days as needed for mild constipation or moderate constipation.     chlorproMAZINE 50 MG tablet  Commonly known as:  THORAZINE  Take 50-100 mg by mouth. Take 50 mg twice daily (at 1200 and 1800) and take 100 mg at 2300     collagenase ointment  Commonly known as:  SANTYL  Apply topically daily. Apply to necrotic ulcer on b/l flanks and right shoulder     diphenhydrAMINE 25 MG tablet  Commonly known as:  BENADRYL  Take 25 mg by mouth every 8 (eight) hours as needed for itching.     divalproex 250 MG 24 hr tablet  Commonly known as:  DEPAKOTE ER  Take 1,250 mg by mouth at bedtime.     furosemide 20 MG tablet  Commonly known as:  LASIX  Take 2 tablets (40 mg total) by mouth 2 (two) times daily.     lactulose 10 GM/15ML solution  Commonly known as:  CHRONULAC  Take 30 mLs (20 g  total) by mouth 3 (three) times daily.     lamoTRIgine 100 MG tablet  Commonly known as:  LAMICTAL  Take 100 mg by mouth 2 (two) times daily. For mood disorder     levothyroxine 150 MCG tablet  Commonly known as:  SYNTHROID, LEVOTHROID  Take 150 mcg by mouth every morning. For thyroid disease.     LORazepam 0.5 MG tablet  Commonly known as:  ATIVAN  Take one tablet by mouth three times daily as needed for anxiety     MILK OF MAGNESIA 400 MG/5ML suspension  Generic drug:  magnesium hydroxide  Take 30 mLs by mouth daily as needed for mild constipation.     PRESCRIPTION MEDICATION  Apply 1 application topically. Compounded ativan 0.5mg  gel.  Apply 1mg /38ml twice daily as needed for anxiety       Allergies  Allergen Reactions  . Food Rash    "lasagna"= rash from the noodles  . Penicillins Hives    Per MAR- unable to ask quantification questions Can tolerate cephalosporins  . Sulfamethoxazole-Trimethoprim Hives and Nausea And Vomiting      The results of significant diagnostics from this hospitalization (including imaging, microbiology, ancillary and laboratory) are listed below for reference.    Significant Diagnostic Studies: US Renal  05/29/2015  CLINICAL DATA:  Acute kidney injury. EXAM: RENAL / URINARY TRACT ULTRASOUND COMPLETE COMPARISON:  06/20/2010 CT abdomen/ pelvis. 12/26/2009 abdominal sonogram. FINDINGS: Evaluation is significantly limited by patient related factors. Right Kidney: Length: 13.6 cm. Echogenicity within normal limits. No mass or hydronephrosis visualized. Left Kidney: Length: 12.6 cm. Echogenicity within normal limits. No mass or hydronephrosis visualized. Bladder: Appears normal for degree of bladder distention. IMPRESSION: Limited scan, with grossly normal kidneys and bladder. No hydronephrosis. Electronically Signed   By: Delbert Phenix M.D.   On: 05/29/2015 08:58   Dg Chest Port 1 View  05/30/2015  CLINICAL DATA:  Congestive heart failure EXAM:  PORTABLE CHEST 1 VIEW COMPARISON:  Radiograph 05/28/2015 FINDINGS: Normal cardiac silhouette. Low lung volumes. Increase in central venous congestion. No pneumothorax. IMPRESSION: Decreased lung volumes and increased central venous congestion. Electronically Signed   By: Genevive Bi M.D.   On: 05/30/2015 15:04   Dg Chest Port 1 View  05/28/2015  CLINICAL DATA:  Hypoxia, decreased O2 sats. EXAM: PORTABLE CHEST 1 VIEW COMPARISON:  05/17/2015. FINDINGS: Trachea is midline given patient rotation. Heart is enlarged, stable. Lungs are somewhat low in volume with probable vascular crowding. No definite pleural fluid. IMPRESSION: Low lung volumes with probable vascular crowding. No definite edema. Electronically Signed   By: Leanna Battles M.D.   On: 05/28/2015 20:02   Dg Chest Port 1 View  05/17/2015  CLINICAL DATA:  Endotracheal tube.  COPD.  Asthma EXAM: PORTABLE CHEST 1 VIEW COMPARISON:  05/16/2015 FINDINGS: Interval extubation and removal of NG tube. Stable enlarged cardiac silhouette. There is mild increase in fine airspace disease in the upper lobes. Low lung volumes. No increase in  atelectasis. LEFT central venous line unchanged. IMPRESSION: 1. Stable lung volumes following extubation. 2. Slight increase in upper lobe airspace disease suggests mild pulmonary edema. Electronically Signed   By: Genevive Bi M.D.   On: 05/17/2015 07:13   Dg Chest Port 1 View  05/16/2015  CLINICAL DATA:  64 year old female currently intubated EXAM: PORTABLE CHEST 1 VIEW COMPARISON:  Prior chest x-ray 05/15/2015 FINDINGS: The tip of the endotracheal tube is 2.4 cm above the carina. A nasogastric tube is present and is coiled within the gastric body. Left subclavian approach central venous catheter remains in unchanged position with the tip overlying the superior cavoatrial junction. Persistent low inspiratory volumes with bibasilar atelectasis versus infiltrate. Stable mild cardiomegaly. Slight blurring of the  interstitial markings likely secondary to patient motion. Overall, no significant interval change in the appearance of the chest. No acute osseous abnormality. IMPRESSION: 1. The tip the endotracheal tube is 2.4 cm above the carina. Other support apparatus also in satisfactory and unchanged position. 2. No significant interval change in the appearance of the lungs with persistent low inspiratory volumes and bibasilar atelectasis versus infiltrate. 3. Unchanged borderline cardiomegaly. Electronically Signed   By: Malachy Moan M.D.   On: 05/16/2015 09:29   Portable Chest Xray  05/15/2015  CLINICAL DATA:  Intubation. EXAM: PORTABLE CHEST 1 VIEW COMPARISON:  05/14/2015. FINDINGS: Endotracheal tube 3 cm above the carina. Left subclavian central line stable position. NG tube in stable position. Mediastinum and hilar structures are unremarkable. Cardiomegaly with normal pulmonary vascularity. Bibasilar subsegmental atelectasis and/or infiltrates. Small right pleural effusion cannot be excluded. No pneumothorax . IMPRESSION: 1. Endotracheal tube 3 cm above the carina. Remaining lines and tubes in stable position. 2. Bibasilar subsegmental atelectasis and/or mild infiltrates. 3. Stable cardiomegaly . Electronically Signed   By: Maisie Fus  Register   On: 05/15/2015 07:06   Dg Chest Port 1 View  05/14/2015  CLINICAL DATA:  Central line and orogastric tube placement. Altered mental status. Initial encounter. EXAM: PORTABLE CHEST 1 VIEW COMPARISON:  Chest radiograph performed 05/13/2015 FINDINGS: The patient's endotracheal tube is seen ending just above the carina. This should be retracted 2-3 cm. An enteric tube is noted extending below the diaphragm. Patchy bilateral airspace opacification may reflect atelectasis. No definite pleural effusion or pneumothorax is seen. The cardiomediastinal silhouette is enlarged. No acute osseous abnormalities are identified. IMPRESSION: 1. Endotracheal tube seen ending just above the  carina. This should be retracted 2-3 cm. 2. Patchy bilateral airspace opacification may reflect atelectasis. Cardiomegaly noted. Electronically Signed   By: Roanna Raider M.D.   On: 05/14/2015 06:24   Dg Chest Port 1 View  05/13/2015  CLINICAL DATA:  Hypoxia. Diabetes. Shortness of breath. Former smoker for 14 years. EXAM: PORTABLE CHEST 1 VIEW COMPARISON:  05/23/2013 FINDINGS: Shallow inspiration. Cardiac enlargement without significant vascular congestion. Mild volume loss in the right upper lung is probably due to shallow inspiration. No focal consolidation or airspace disease is appreciated. No blunting of costophrenic angles. No pneumothorax. Mediastinal contours appear intact. IMPRESSION: Cardiac enlargement.  Shallow inspiration.  No focal consolidation. Electronically Signed   By: Burman Nieves M.D.   On: 05/13/2015 20:06    Microbiology: Recent Results (from the past 240 hour(s))  Blood Culture (routine x 2)     Status: None   Collection Time: 05/28/15  7:48 PM  Result Value Ref Range Status   Specimen Description BLOOD RIGHT ANTECUBITAL  Final   Special Requests BOTTLES DRAWN AEROBIC AND ANAEROBIC 5CC  Final  Culture NO GROWTH 5 DAYS  Final   Report Status 06/02/2015 FINAL  Final  Blood Culture (routine x 2)     Status: None   Collection Time: 05/28/15  8:03 PM  Result Value Ref Range Status   Specimen Description BLOOD LEFT ANTECUBITAL  Final   Special Requests BOTTLES DRAWN AEROBIC AND ANAEROBIC 5CC  Final   Culture NO GROWTH 5 DAYS  Final   Report Status 06/02/2015 FINAL  Final  Urine culture     Status: None   Collection Time: 05/28/15  8:47 PM  Result Value Ref Range Status   Specimen Description URINE, CATHETERIZED  Final   Special Requests NONE  Final   Culture >=100,000 COLONIES/mL YEAST  Final   Report Status 05/30/2015 FINAL  Final  Wound culture     Status: None   Collection Time: 05/29/15  2:32 AM  Result Value Ref Range Status   Specimen Description ULCER   Final   Special Requests Immunocompromised  Final   Gram Stain   Final    RARE WBC PRESENT, PREDOMINANTLY PMN RARE SQUAMOUS EPITHELIAL CELLS PRESENT FEW GRAM POSITIVE COCCI IN PAIRS IN CLUSTERS RARE GRAM NEGATIVE RODS Performed at [facility]    Culture   Final    MODERATE PSEUDOMONAS AERUGINOSA Performed at [facility]    Report Status 06/02/2015 FINAL  Final   Organism ID, Bacteria PSEUDOMONAS AERUGINOSA  Final      Susceptibility   Pseudomonas aeruginosa - MIC*    CEFEPIME 8 SENSITIVE Sensitive     CEFTAZIDIME 32 RESISTANT Resistant     CIPROFLOXACIN <=0.25 SENSITIVE Sensitive     GENTAMICIN <=1 SENSITIVE Sensitive     IMIPENEM 2 SENSITIVE Sensitive     TOBRAMYCIN <=1 SENSITIVE Sensitive     * MODERATE PSEUDOMONAS AERUGINOSA  MRSA PCR Screening     Status: None   Collection Time: 05/29/15  2:32 AM  Result Value Ref Range Status   MRSA by PCR NEGATIVE NEGATIVE Final    Comment:        The GeneXpert MRSA Assay (FDA approved for NASAL specimens only), is one component of a comprehensive MRSA colonization surveillance program. It is not intended to diagnose MRSA infection nor to guide or monitor treatment for MRSA infections.      Labs: Basic Metabolic Panel:  Recent Labs Lab 05/29/15 0453  05/31/15 0354 06/01/15 0256 06/02/15 0615 06/03/15 0406 06/04/15 0439  NA 139  < > 141 144 142 140 140  K 3.5  < > 3.6 3.4* 3.1* 3.3* 3.3*  CL 98*  < > 104 102 97* 94* 92*  CO2 36*  < > 33* 36* 38* 40* 38*  GLUCOSE 91  < > 95 81 106* 86 86  BUN 8  < > 6 <5* <5* <5* 6  CREATININE 1.90*  < > 1.25* 0.96 0.89 0.85 0.89  CALCIUM 8.1*  < > 8.4* 8.9 9.2 9.3 9.3  MG 1.6*  --   --   --  1.6* 1.4* 1.6*  PHOS 4.7*  --   --   --   --   --   --   < > = values in this interval not displayed. Liver Function Tests:  Recent Labs Lab 05/28/15 1948  AST 14*  ALT 9*  ALKPHOS 55  BILITOT 0.4  PROT 6.2*  ALBUMIN 2.9*   No results for input(s): LIPASE, AMYLASE  in the last 168 hours.  Recent Labs Lab 05/30/15 1310 06/02/15 0615  06/04/15 0439  AMMONIA 176* 78* 80*   CBC:  Recent Labs Lab 05/28/15 1948 05/29/15 0453 05/30/15 0600 05/31/15 0354 06/01/15 0256  WBC 6.4 6.0 6.1 5.4 4.3  NEUTROABS 3.7  --   --   --   --   HGB 10.6* 8.7* 9.1* 9.6* 9.9*  HCT 35.9* 29.6* 31.9* 33.2* 35.3*  MCV 100.6* 101.7* 104.6* 103.8* 103.2*  PLT 257 214 233 210 248   Cardiac Enzymes: No results for input(s): CKTOTAL, CKMB, CKMBINDEX, TROPONINI in the last 168 hours. BNP: BNP (last 3 results)  Recent Labs  05/29/15 0748  BNP 100.4*    ProBNP (last 3 results) No results for input(s): PROBNP in the last 8760 hours.  CBG:  Recent Labs Lab 05/30/15 05/30/15 0418 05/30/15 0752 05/30/15 1205 06/01/15 1206  GLUCAP 95 103* 86 95 72       SignedCalvert Cantor, MD [facility] 06/04/2015, 2:54 PM

## 2015-06-04 NOTE — Progress Notes (Signed)
[facility] called and ambulance requested for bariatric stretcher and oxygen.

## 2015-06-04 NOTE — Progress Notes (Signed)
Pt rolled onto side for Dr. Butler Denmark to observed areas on back.  Areas covered with Allevyn foam.  Some slight drainage present to dressings.

## 2015-06-04 NOTE — Progress Notes (Signed)
Report given to Annabelle Harman, LPN at [facility] where the patient is going to be transferred to.

## 2015-06-04 NOTE — Consult Note (Signed)
Recommendations for discharge.  Unable to contact sister Elease Hashimoto again, several messages left.  Therefore the  Secondary plan of Ms. Monnin returning to [facility] SN[facility] with hospice services will be pursued.  Refer to hospice with diagnosis of End stage respiratory disease, COPD complicated by CHF and morbid obesity.  Recommended discharge medications related to control of significant long term medical history schizoaffective.bipolar disorder.  Thorazine Depakote  Lamictal   Synthroid as well   MOM PRN Ativan PRN  Wound care as ordered  RN Liaison Reyne Dumas with [facility], message left that patient would be returning to facility most likely today.  Cindra Presume, RN-BC, MSN, [facility] Palliative Medicine

## 2015-06-04 NOTE — Clinical Social Work Note (Addendum)
Patient discharged back to [facility] today. Discharge information transmitted to facility and patient's contact Tedra Senegal) at facility aware of discharge. CSW contacted patient's sister Rowland Lathe [phone]) and also informed her of discharge.  Patient will be transported by ambulance.   Genelle Bal, MSW, LCSW Licensed Clinical Social Worker Clinical Social Work Department [employer] [phone]

## 2015-06-05 ENCOUNTER — Encounter: Payer: Self-pay | Admitting: Internal Medicine

## 2015-06-05 DIAGNOSIS — R601 Generalized edema: Secondary | ICD-10-CM | POA: Insufficient documentation

## 2015-06-05 DIAGNOSIS — B9620 Unspecified Escherichia coli [E. coli] as the cause of diseases classified elsewhere: Secondary | ICD-10-CM | POA: Insufficient documentation

## 2015-06-05 DIAGNOSIS — N390 Urinary tract infection, site not specified: Secondary | ICD-10-CM | POA: Insufficient documentation

## 2015-06-05 NOTE — Assessment & Plan Note (Addendum)
Patient's Lasix and losartan were held on admission due to severe sepsis and now developed anasarca. Renal function has normalized and patient placed back on both the medications. SNF - losartan was not on d/c summary but lisinopril 5 mg is ; check BMP for renal fx

## 2015-06-05 NOTE — Assessment & Plan Note (Addendum)
SNF - controlled on lasix 40 mg BID and lisinopril 5 mg

## 2015-06-05 NOTE — Assessment & Plan Note (Signed)
Secondary to cellulitis of right breast and UTI. Required vasopressor and steroid and ICU monitoring on ventilator. Now resolved. Discontinued hydrocortisone. Antibiotics is to Rocephin.  SNF - discharged on Keflex to complete a total 2 weeks of antibiotics.Marland Kitchen

## 2015-06-05 NOTE — Assessment & Plan Note (Signed)
SNF - cont - thorazine, lamictaldepakote and ativan

## 2015-06-05 NOTE — Assessment & Plan Note (Signed)
SNF - reported rate controlled; on ASA as prophylaxis

## 2015-06-05 NOTE — Assessment & Plan Note (Signed)
Treating with IV Rocephin. Culture sensitive to cephalosporin. SNF -  oral Keflex to complete 2 weeks course of antibiotics.

## 2015-06-05 NOTE — Assessment & Plan Note (Signed)
Treating with IV Rocephin. Culture sensitive to cephalosporin. SNF -  oral Keflex to complete 2 weeks course of antibiotics.  

## 2015-06-05 NOTE — Assessment & Plan Note (Signed)
Second 8 to sepsis and hypercapnic respiratory failure. Improved.

## 2015-06-05 NOTE — Assessment & Plan Note (Signed)
Treated initially with clindamycin and was traced to vancomycin with Zosyn while in the ICU. Then transitioned to Rocephin. Improved.

## 2015-06-05 NOTE — Assessment & Plan Note (Signed)
Significant increase in weight with fluid overload (393 Lb on admission to 415 pounds). Placed on scheduled IV Lasix. Has good urine output. She still has net positive fluid balance (>1L). Not in distress. SNF - cont new dose of  Lasix to 40 mg by mouth twice daily. Monitor weight.

## 2015-06-05 NOTE — Assessment & Plan Note (Signed)
Has OSA with morbid obesity (>400 pounds and BMI of 72). Patient developed ventilator dependent respiratory failure on admission with acute metabolic encephalopathy. Now extubated and stable. SNF - cont CPAP at bedtime.

## 2015-06-08 ENCOUNTER — Encounter: Payer: Self-pay | Admitting: Internal Medicine

## 2015-06-08 ENCOUNTER — Non-Acute Institutional Stay (SKILLED_NURSING_FACILITY): Payer: Medicare Other | Admitting: Internal Medicine

## 2015-06-08 DIAGNOSIS — N179 Acute kidney failure, unspecified: Secondary | ICD-10-CM | POA: Diagnosis not present

## 2015-06-08 DIAGNOSIS — G9340 Encephalopathy, unspecified: Secondary | ICD-10-CM

## 2015-06-08 DIAGNOSIS — E662 Morbid (severe) obesity with alveolar hypoventilation: Secondary | ICD-10-CM | POA: Diagnosis not present

## 2015-06-08 DIAGNOSIS — I5023 Acute on chronic systolic (congestive) heart failure: Secondary | ICD-10-CM | POA: Diagnosis not present

## 2015-06-08 DIAGNOSIS — A419 Sepsis, unspecified organism: Secondary | ICD-10-CM | POA: Diagnosis not present

## 2015-06-08 DIAGNOSIS — I480 Paroxysmal atrial fibrillation: Secondary | ICD-10-CM | POA: Diagnosis not present

## 2015-06-08 DIAGNOSIS — E038 Other specified hypothyroidism: Secondary | ICD-10-CM | POA: Diagnosis not present

## 2015-06-08 DIAGNOSIS — R7989 Other specified abnormal findings of blood chemistry: Secondary | ICD-10-CM

## 2015-06-08 DIAGNOSIS — R600 Localized edema: Secondary | ICD-10-CM

## 2015-06-08 DIAGNOSIS — F250 Schizoaffective disorder, bipolar type: Secondary | ICD-10-CM

## 2015-06-08 DIAGNOSIS — R7302 Impaired glucose tolerance (oral): Secondary | ICD-10-CM | POA: Diagnosis not present

## 2015-06-08 DIAGNOSIS — J9621 Acute and chronic respiratory failure with hypoxia: Secondary | ICD-10-CM | POA: Diagnosis not present

## 2015-06-08 DIAGNOSIS — J9622 Acute and chronic respiratory failure with hypercapnia: Secondary | ICD-10-CM

## 2015-06-08 DIAGNOSIS — E034 Atrophy of thyroid (acquired): Secondary | ICD-10-CM

## 2015-06-08 NOTE — Progress Notes (Signed)
MRN: [id number] Name: FRANKIE ZITO  Sex: female Age: 64 y.o. DOB: 1951-02-02  PSC #: Ronni Rumble Facility/Room:202 Level Of Care: SNF Provider: Merrilee Seashore D Emergency Contacts: Extended Emergency Contact Information Primary Emergency Contact: Maxwell,Clarance Address: [address]          [place], [place] [address] [place] Phone: [phone] Relation: Friend Secondary Emergency Contact: Virl Son, Colona [place] Phone: [phone] Relation: Friend Guardian: Fraiser,Patricia          [place] Home Phone: [phone] Relation: Sister  Code Status:   Allergies: Food; Penicillins; and Sulfamethoxazole-trimethoprim  Chief Complaint  Patient presents with  . Readmit To SNF    HPI: Patient is 64 y.o. female with schizoaffective disorder, OHS, bedbound, atrial fibrillation, epilepsy, and chronic respiratory failure presented from her nursing facility secondary to hypoxia and fever.She was admitted to [facility] from 12/8-15 for sepsis from back wounds, acute on chronic respiratory failure 2/2 acute on chronic CHF. Hospital course was complicated by encephalopathy from sepsis, hypercapnea and elevated ammonia level, AF with RVR requiring drip and AKI. Pt is admitted to SNF with extremely poor prognosis. Pt, her sister and Mina Marble were made aware in hospital and I have made them aware today at SNF. While at SNF pt will be followed for and meds continued for PAF, tx with metoprolol and ASA, schizoaffective disorder, tx with depakote and thorazine and hypothyroidism tx with synthroid.  Past Medical History  Diagnosis Date  . Bipolar disorder (HCC)   . Schizoaffective disorder (HCC)   . Mood disorder (HCC)   . Epilepsy (HCC)   . Thyroid disease   . Hiatal hernia   . Atrial flutter (HCC)   . Schizoaffective disorder   . Urinary tract infection   . Hypertension   . GERD  (gastroesophageal reflux disease)   . Hypothyroidism   . Obesity   . Asthma   . COPD (chronic obstructive pulmonary disease) (HCC)   . CHF (congestive heart failure) (HCC)   . Seizures (HCC)   . Morbid obesity (HCC)   . Chronic pain   . Hypercholesteremia   . Anginal pain (HCC)   . Myocardial infarction (HCC) 2002  . Diabetes mellitus     "I'm not diabetic anymore" (05/23/2012)  . Anemia   . History of blood transfusion 1993    "when I had my hysterectomy" (05/23/2012)  . H/O hiatal hernia   . Daily headache   . Arthritis     "severe; all over my body" (05/23/2012)  . Hypoventilation syndrome     [name] 05/30/2012  . Atrial fibrillation (HCC)   . Atrial flutter, paroxysmal (HCC)     [name] 05/30/2012  . Exertional dyspnea   . Shortness of breath     "all the time lately" (05/30/2012)  . Pneumonia     "several times; including now" (04/23/2013)  . Anxiety   . Episodic mood disorder (HCC)   . Psychosis   . Schizophrenia (HCC)   . 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 CHOLELITHIASIS 11/30/2009    Qualifier: Diagnosis of  By: Brynda Rim      Past Surgical History  Procedure Laterality Date  . Tubal ligation  1998  . Cholecystectomy  ?2008  . Cardiac catheterization  2008  . Vaginal hysterectomy  1993  Medication List       This list is accurate as of: 06/08/15 11:59 PM.  Always use your most recent med list.               bisacodyl 10 MG suppository  Commonly known as:  DULCOLAX  Place 10 mg rectally every 3 (three) days as needed for mild constipation or moderate constipation.     chlorproMAZINE 50 MG tablet  Commonly known as:  THORAZINE  Take 50-100 mg by mouth. Take 50 mg twice daily (at 1200 and 1800) and take 100 mg at 2300     collagenase ointment  Commonly known as:  SANTYL  Apply topically daily. Apply to necrotic ulcer on b/l flanks and right shoulder     diphenhydrAMINE 25 MG  tablet  Commonly known as:  BENADRYL  Take 25 mg by mouth every 8 (eight) hours as needed for itching.     divalproex 250 MG 24 hr tablet  Commonly known as:  DEPAKOTE ER  Take 1,250 mg by mouth at bedtime.     furosemide 20 MG tablet  Commonly known as:  LASIX  Take 2 tablets (40 mg total) by mouth 2 (two) times daily.     lactulose 10 GM/15ML solution  Commonly known as:  CHRONULAC  Take 30 mLs (20 g total) by mouth 3 (three) times daily.     lamoTRIgine 100 MG tablet  Commonly known as:  LAMICTAL  Take 100 mg by mouth 2 (two) times daily. For mood disorder     levothyroxine 150 MCG tablet  Commonly known as:  SYNTHROID, LEVOTHROID  Take 150 mcg by mouth every morning. For thyroid disease.     LORazepam 0.5 MG tablet  Commonly known as:  ATIVAN  Take one tablet by mouth three times daily as needed for anxiety     MILK OF MAGNESIA 400 MG/5ML suspension  Generic drug:  magnesium hydroxide  Take 30 mLs by mouth daily as needed for mild constipation.     PRESCRIPTION MEDICATION  Apply 1 application topically. Compounded ativan 0.5mg  gel.  Apply 1mg /69ml twice daily as needed for anxiety        No orders of the defined types were placed in this encounter.    Immunization History  Administered Date(s) Administered  . Influenza Split 03/29/2012  . Influenza Whole 05/27/2009, 02/18/2010  . Influenza,inj,Quad PF,36+ Mos 04/24/2013  . Influenza-Unspecified 03/31/2014  . Pneumococcal Polysaccharide-23 05/20/2009, 03/29/2012  . Td 05/27/2009    Social History  Substance Use Topics  . Smoking status: Former Smoker -- 2.00 packs/day for 12 years    Types: Cigarettes    Quit date: 06/29/2000  . Smokeless tobacco: Current User    Types: Chew  . Alcohol Use: No    Family history is + Ca, HD    Review of Systems -Pt has no c/o , she wants to be left alone; per nursing, she is refusing meds    There were no vitals filed for this visit.  SpO2 Readings from Last 1  Encounters:  06/04/15 100%        Physical Exam  GENERAL APPEARANCE: Alert, conversant with Mina Marble, friend SKIN: No diaphoresis rash HEAD: Normocephalic, atraumatic  EYES: Conjunctiva/lids clear. Pupils round, reactive. EOMs intact.  EARS: External exam WNL, canals clear. Hearing grossly normal.  NOSE: No deformity or discharge.  MOUTH/THROAT: Lips w/o lesions  RESPIRATORY: Breathing is even, unlabored. Lung sounds are rhonchi   CARDIOVASCULAR: Heart RRR no murmurs, rubs  or gallops. No peripheral edema.   GASTROINTESTINAL: Abdomen is soft, non-tender, not distended w/ normal bowel sounds. GENITOURINARY: Bladder non tender, not distended  MUSCULOSKELETAL: No abnormal joints or musculature NEUROLOGIC:  Cranial nerves 2-12 grossly intact. Moves all extremities  PSYCHIATRIC: pt wants to be left alone, pt doesn't want to wear oxygen, does not want to go to hospital, understands what that means  Patient Active Problem List   Diagnosis Date Noted  . Increased ammonia level 06/15/2015  . Systolic CHF, acute on chronic (HCC) 06/15/2015  . Impaired glucose tolerance 06/15/2015  . Lower leg edema 06/15/2015  . E. coli UTI 06/05/2015  . Anasarca 06/05/2015  . Acute on chronic respiratory failure with hypoxia and hypercapnia (HCC)   . Schizoaffective disorder (HCC) 05/29/2015  . Cellulitis and abscess of trunk 05/29/2015  . AKI (acute kidney injury) (HCC) 05/28/2015  . Sepsis (HCC) 05/28/2015  . Fluid overload 05/21/2015  . Sepsis due to Escherichia coli (E. coli) (HCC) 05/21/2015  . CHF with unknown LVEF (HCC)   . Pressure ulcer 05/19/2015  . Pickwickian syndrome (HCC) 05/19/2015  . Acute and chronic respiratory failure with hypercapnia (HCC) 05/14/2015  . Septic shock (HCC) 05/14/2015  . Acute respiratory failure (HCC) 05/14/2015  . Cellulitis of right breast 05/13/2015  . Acute renal failure (HCC) 05/13/2015  . Hyperkalemia 05/13/2015  . Hyponatremia 05/13/2015  . Acute  encephalopathy 05/13/2015  . Hypertensive heart disease with CHF (congestive heart failure) (HCC) 01/01/2015  . Schizoaffective disorder, bipolar type (HCC) 08/07/2014  . COPD (chronic obstructive pulmonary disease) (HCC) 04/19/2013  . Atrial fibrillation (HCC) 03/28/2012  . Hypothyroidism 03/28/2012  . Diabetes mellitus (HCC) 03/28/2012  . Morbid obesity (HCC)   . Obstructive sleep apnea 01/22/2010  . LOW BACK PAIN SYNDROME 12/11/2007  . GERD 02/06/2007  . TARDIVE DYSKINESIA 08/10/2004    CBC    Component Value Date/Time   WBC 4.3 06/01/2015 0256   WBC 5.7 01/30/2014   RBC 3.42* 06/01/2015 0256   RBC 2.70* 05/19/2011 0632   HGB 9.9* 06/01/2015 0256   HCT 35.3* 06/01/2015 0256   PLT 248 06/01/2015 0256   MCV 103.2* 06/01/2015 0256   LYMPHSABS 1.8 05/28/2015 1948   MONOABS 0.9 05/28/2015 1948   EOSABS 0.0 05/28/2015 1948   BASOSABS 0.0 05/28/2015 1948    CMP     Component Value Date/Time   NA 140 06/04/2015 0439   NA 141 01/30/2014   K 3.3* 06/04/2015 0439   CL 92* 06/04/2015 0439   CO2 38* 06/04/2015 0439   GLUCOSE 86 06/04/2015 0439   BUN 6 06/04/2015 0439   BUN 9 01/30/2014   CREATININE 0.89 06/04/2015 0439   CREATININE 0.5 01/30/2014   CALCIUM 9.3 06/04/2015 0439   PROT 6.2* 05/28/2015 1948   ALBUMIN 2.9* 05/28/2015 1948   AST 14* 05/28/2015 1948   ALT 9* 05/28/2015 1948   ALKPHOS 55 05/28/2015 1948   BILITOT 0.4 05/28/2015 1948   GFRNONAA >60 06/04/2015 0439   GFRAA >60 06/04/2015 0439    Lab Results  Component Value Date   HGBA1C 5.9* 05/14/2015     US Renal  05/29/2015  CLINICAL DATA:  Acute kidney injury. EXAM: RENAL / URINARY TRACT ULTRASOUND COMPLETE COMPARISON:  06/20/2010 CT abdomen/ pelvis. 12/26/2009 abdominal sonogram. FINDINGS: Evaluation is significantly limited by patient related factors. Right Kidney: Length: 13.6 cm. Echogenicity within normal limits. No mass or hydronephrosis visualized. Left Kidney: Length: 12.6 cm. Echogenicity within  normal limits. No mass or hydronephrosis visualized. Bladder:  Appears normal for degree of bladder distention. IMPRESSION: Limited scan, with grossly normal kidneys and bladder. No hydronephrosis. Electronically Signed   By: Delbert Phenix M.D.   On: 05/29/2015 08:58   Dg Chest Port 1 View  05/28/2015  CLINICAL DATA:  Hypoxia, decreased O2 sats. EXAM: PORTABLE CHEST 1 VIEW COMPARISON:  05/17/2015. FINDINGS: Trachea is midline given patient rotation. Heart is enlarged, stable. Lungs are somewhat low in volume with probable vascular crowding. No definite pleural fluid. IMPRESSION: Low lung volumes with probable vascular crowding. No definite edema. Electronically Signed   By: Leanna Battles M.D.   On: 05/28/2015 20:02    Not all labs, radiology exams or other studies done during hospitalization come through on my EPIC note; however they are reviewed by me.    Assessment and Plan  Sepsis (HCC) -Blood cultures 2 sets--NGTD -Appreciate wound care nurse -has received vancomycin, Ceftaz and cipro--today is day 7 of treatment- will d/c today- continue wound care at facility -nonsurgical setting wound culture--pseudomonas--unclear clinical significance -Air mattress -Urinalysis negative for pyuria -Chest x-ray negative for infiltrates  -05/29/2015--Gen. surgery was consulted--felt the patient did not need debridement and recommended medical therapy with antibiotics and wound care SNF - will continue wound care, air mattress  Acute on chronic respiratory failure with hypoxia and hypercapnia (HCC) -pt is normally on 2L [place] at SNF -05/30/2015--patient developed obtundation secondary to hypercarbia -Some improvement with CPAP, transferred to stepdown for BiPAP -Multifactorial including COPD/OHS and CHF -Now appears clinically volume overloaded -05/31/15--started furosemide -Aerosolized albuterol and Atrovent  -do NOT titrate oxygen up for sat >94% as pt is chronic CO2  retainer -06/01/15--consulted pulmonary/CCM-->made DNR -06/02/15--weaned off BiPAP but still requires intermittenly at night SNF - pt is pushing away her O2, says I want to be left alone, "I'm dying" does not wish to go to hospital again; pt's prognosis if she was meticulously compliant is very poor, non compliance makes continued life not possible; I have spoken to both caretakers and they understand, Mrs Delashmit is her own POA  Acute encephalopathy due to infection and hypercarbia, hyperammonemia- restless and confused SNF - hypercapnea, hepatic encephalopathy and hypoxia expected to  continue to the end  Increased ammonia level due to chronic hepatic congestion from OHS -continue lactulose -recheck ammonia--> 176 >> 78 >>80 -may need NG tube for lactulose SNF - pt has made herself DNR, pt will not allow NG tube or lactulose enema and to what purpose as there is no changing the underlying pathology  AKI (acute kidney injury) (HCC) -Baseline creatinine 0.7-1.0 -developed fluid overload-->renal function stable with lasix 40 mg IV bid -Suspect sepsis and volume depletion as etiology -Holding lisinopril  SNF - cont hold lisinopril  Systolic CHF, acute on chronic (HCC) 05/14/2015 echo EF 50-55%, PAP 40 -pt clinically fluid overloaded -Daily weights -05/21/2015 discharge weight 415 pounds  -12/11--start IV furosemide--> continue  -NEG 10 L with Lasix--now with neg fluid balance for the admission SNF - will continue lasix, pt is refusing meds at times  Atrial fibrillation (HCC) -Patient went into RVR on 06/01/2015--placed on diltiazem drip -06/02/2015 spontaneous conversion to sinus rhythm -Transitioned to low dose metoprolol in setting of CHF -CHADSVASc 3 SNF - continue ASA and metoprolol  Schizoaffective disorder, bipolar type (HCC) SNF - Continue Depakote, Thorazine -Check VPA level--53  Impaired glucose tolerance SNF - 05/14/2015 hemoglobin A1c 5.9 -Discontinue CBG  checks  Hypothyroidism SNF - Continue Synthroid - TSH-3.804   Lower leg edema Venous duplex r/o DVT--NEG   Morbid  obesity (HCC) ->400 pounds and BMI of 72   Time spent > 60 min;> 50% of time with patient was spent reviewing records, labs, tests and studies, counseling and developing plan of care  Margit Hanks, MD

## 2015-06-15 DIAGNOSIS — R600 Localized edema: Secondary | ICD-10-CM | POA: Insufficient documentation

## 2015-06-15 DIAGNOSIS — I5023 Acute on chronic systolic (congestive) heart failure: Secondary | ICD-10-CM | POA: Insufficient documentation

## 2015-06-15 DIAGNOSIS — R7302 Impaired glucose tolerance (oral): Secondary | ICD-10-CM | POA: Insufficient documentation

## 2015-06-15 DIAGNOSIS — R7989 Other specified abnormal findings of blood chemistry: Secondary | ICD-10-CM | POA: Insufficient documentation

## 2015-06-15 NOTE — Assessment & Plan Note (Signed)
->  400 pounds and BMI of 72

## 2015-06-15 NOTE — Assessment & Plan Note (Signed)
-  Patient went into RVR on 06/01/2015--placed on diltiazem drip -06/02/2015 spontaneous conversion to sinus rhythm -Transitioned to low dose metoprolol in setting of CHF -CHADSVASc 3 SNF - continue ASA and metoprolol

## 2015-06-15 NOTE — Assessment & Plan Note (Signed)
-  pt is normally on 2L [place] at SNF -05/30/2015--patient developed obtundation secondary to hypercarbia -Some improvement with CPAP, transferred to stepdown for BiPAP -Multifactorial including COPD/OHS and CHF -Now appears clinically volume overloaded -05/31/15--started furosemide -Aerosolized albuterol and Atrovent  -do NOT titrate oxygen up for sat >94% as pt is chronic CO2 retainer -06/01/15--consulted pulmonary/CCM-->made DNR -06/02/15--weaned off BiPAP but still requires intermittenly at night SNF - pt is pushing away her O2, says I want to be left alone, "I'm dying" does not wish to go to hospital again; pt's prognosis if she was meticulously compliant is very poor, non compliance makes continued life not possible; I have spoken to both caretakers and they understand, Mrs Ficke is her own POA

## 2015-06-15 NOTE — Assessment & Plan Note (Signed)
due to chronic hepatic congestion from OHS -continue lactulose -recheck ammonia--> 176 >> 78 >>80 -may need NG tube for lactulose SNF - pt has made herself DNR, pt will not allow NG tube or lactulose enema and to what purpose as there is no changing the underlying pathology

## 2015-06-15 NOTE — Assessment & Plan Note (Signed)
SNF - Continue Synthroid - TSH-3.804

## 2015-06-15 NOTE — Assessment & Plan Note (Signed)
SNF - 05/14/2015 hemoglobin A1c 5.9 -Discontinue CBG checks

## 2015-06-15 NOTE — Assessment & Plan Note (Signed)
Venous duplex r/o DVT--NEG

## 2015-06-15 NOTE — Assessment & Plan Note (Signed)
-  Blood cultures 2 sets--NGTD -Appreciate wound care nurse -has received vancomycin, Ceftaz and cipro--today is day 7 of treatment- will d/c today- continue wound care at facility -nonsurgical setting wound culture--pseudomonas--unclear clinical significance -Air mattress -Urinalysis negative for pyuria -Chest x-ray negative for infiltrates  -05/29/2015--Gen. surgery was consulted--felt the patient did not need debridement and recommended medical therapy with antibiotics and wound care SNF - will continue wound care, air mattress

## 2015-06-15 NOTE — Assessment & Plan Note (Signed)
05/14/2015 echo EF 50-55%, PAP 40 -pt clinically fluid overloaded -Daily weights -05/21/2015 discharge weight 415 pounds  -12/11--start IV furosemide--> continue  -NEG 10 L with Lasix--now with neg fluid balance for the admission SNF - will continue lasix, pt is refusing meds at times

## 2015-06-15 NOTE — Assessment & Plan Note (Signed)
-  Baseline creatinine 0.7-1.0 -developed fluid overload-->renal function stable with lasix 40 mg IV bid -Suspect sepsis and volume depletion as etiology -Holding lisinopril  SNF - cont hold lisinopril

## 2015-06-15 NOTE — Assessment & Plan Note (Signed)
SNF - Continue Depakote, Thorazine -Check VPA level--53

## 2015-06-15 NOTE — Assessment & Plan Note (Signed)
due to infection and hypercarbia, hyperammonemia- restless and confused SNF - hypercapnea, hepatic encephalopathy and hypoxia expected to  continue to the end

## 2015-07-21 ENCOUNTER — Non-Acute Institutional Stay (SKILLED_NURSING_FACILITY): Payer: Medicare Other | Admitting: Adult Health

## 2015-07-21 DIAGNOSIS — I5022 Chronic systolic (congestive) heart failure: Secondary | ICD-10-CM

## 2015-07-21 DIAGNOSIS — I110 Hypertensive heart disease with heart failure: Secondary | ICD-10-CM

## 2015-07-21 DIAGNOSIS — E034 Atrophy of thyroid (acquired): Secondary | ICD-10-CM | POA: Diagnosis not present

## 2015-07-21 DIAGNOSIS — J9612 Chronic respiratory failure with hypercapnia: Secondary | ICD-10-CM

## 2015-07-21 DIAGNOSIS — E038 Other specified hypothyroidism: Secondary | ICD-10-CM

## 2015-07-21 DIAGNOSIS — J9611 Chronic respiratory failure with hypoxia: Secondary | ICD-10-CM | POA: Diagnosis not present

## 2015-08-13 ENCOUNTER — Encounter: Payer: Self-pay | Admitting: Internal Medicine

## 2015-08-13 ENCOUNTER — Non-Acute Institutional Stay (SKILLED_NURSING_FACILITY): Payer: Medicare Other | Admitting: Internal Medicine

## 2015-08-13 DIAGNOSIS — I110 Hypertensive heart disease with heart failure: Secondary | ICD-10-CM | POA: Diagnosis not present

## 2015-08-13 DIAGNOSIS — J9611 Chronic respiratory failure with hypoxia: Secondary | ICD-10-CM | POA: Diagnosis not present

## 2015-08-13 DIAGNOSIS — J9612 Chronic respiratory failure with hypercapnia: Secondary | ICD-10-CM

## 2015-08-13 DIAGNOSIS — I5022 Chronic systolic (congestive) heart failure: Secondary | ICD-10-CM

## 2015-08-13 NOTE — Progress Notes (Signed)
MRN: [id number] Name: [name]  Sex: female Age: 65 y.o. DOB: 02-Nov-1950  PSC #: Ronni Rumble Facility/Room: Level Of Care: SNF Provider: Merrilee Seashore D Emergency Contacts: Extended Emergency Contact Information Primary Emergency Contact: Maxwell,Clarance Address: [address]          [place], [place] [address] [place] Phone: [phone] Relation: Friend Secondary Emergency Contact: Virl Son, [place] Phone: [phone] Relation: Friend Guardian: Roeper,Patricia          [place] Home Phone: [phone] Relation: Sister  Code Status:   Allergies: Food; Penicillins; and Sulfamethoxazole-trimethoprim  Chief Complaint  Patient presents with  . Medical Management of Chronic Issues    HPI: Patient is 65 y.o. female with morbid obesity, CHF, congestion of liver with elevated ammonia levels chronically, schizophrenia with bipolar dx, non compliant who was expected to die after her last admission and she was ready to die. She has not died, some days she takes her meds, some days not, she wants to be left alone, she is doing it her way. She is being seen for routine issues of HTN, chronic respiratory failure and chronic CHF.  Past Medical History  Diagnosis Date  . Bipolar disorder (HCC)   . Schizoaffective disorder (HCC)   . Mood disorder (HCC)   . Epilepsy (HCC)   . Thyroid disease   . Hiatal hernia   . Atrial flutter (HCC)   . Schizoaffective disorder   . Urinary tract infection   . Hypertension   . GERD (gastroesophageal reflux disease)   . Hypothyroidism   . Obesity   . Asthma   . COPD (chronic obstructive pulmonary disease) (HCC)   . CHF (congestive heart failure) (HCC)   . Seizures (HCC)   . Morbid obesity (HCC)   . Chronic pain   . Hypercholesteremia   . Anginal pain (HCC)   . Myocardial infarction (HCC) 2002  . Diabetes mellitus     "I'm  not diabetic anymore" (05/23/2012)  . Anemia   . History of blood transfusion 1993    "when I had my hysterectomy" (05/23/2012)  . H/O hiatal hernia   . Daily headache   . Arthritis     "severe; all over my body" (05/23/2012)  . Hypoventilation syndrome     [name] 05/30/2012  . Atrial fibrillation (HCC)   . Atrial flutter, paroxysmal (HCC)     [name] 05/30/2012  . Exertional dyspnea   . Shortness of breath     "all the time lately" (05/30/2012)  . Pneumonia     "several times; including now" (04/23/2013)  . Anxiety   . Episodic mood disorder (HCC)   . Psychosis   . Schizophrenia (HCC)   . 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 CHOLELITHIASIS 11/30/2009    Qualifier: Diagnosis of  By: Brynda Rim      Past Surgical History  Procedure Laterality Date  . Tubal ligation  1998  . Cholecystectomy  ?2008  . Cardiac catheterization  2008  . Vaginal hysterectomy  1993      Medication List       This list is accurate as of: 08/13/15 11:59 PM.  Always use your most recent med list.               bisacodyl 10 MG suppository  Commonly known as:  DULCOLAX  Place 10 mg rectally every 3 (three) days as needed for mild constipation or moderate constipation.     chlorproMAZINE 50 MG tablet  Commonly known as:  THORAZINE  Take 50-100 mg by mouth. Take 50 mg twice daily (at 1200 and 1800) and take 100 mg at 2300     collagenase ointment  Commonly known as:  SANTYL  Apply topically daily. Apply to necrotic ulcer on b/l flanks and right shoulder     diphenhydrAMINE 25 MG tablet  Commonly known as:  BENADRYL  Take 25 mg by mouth every 8 (eight) hours as needed for itching.     divalproex 250 MG 24 hr tablet  Commonly known as:  DEPAKOTE ER  Take 1,250 mg by mouth at bedtime.     furosemide 20 MG tablet  Commonly known as:  LASIX  Take 2 tablets (40 mg total) by mouth 2 (two) times daily.     lactulose 10 GM/15ML  solution  Commonly known as:  CHRONULAC  Take 30 mLs (20 g total) by mouth 3 (three) times daily.     lamoTRIgine 100 MG tablet  Commonly known as:  LAMICTAL  Take 100 mg by mouth 2 (two) times daily. For mood disorder     levothyroxine 150 MCG tablet  Commonly known as:  SYNTHROID, LEVOTHROID  Take 150 mcg by mouth every morning. For thyroid disease.     LORazepam 0.5 MG tablet  Commonly known as:  ATIVAN  Take one tablet by mouth three times daily as needed for anxiety     MILK OF MAGNESIA 400 MG/5ML suspension  Generic drug:  magnesium hydroxide  Take 30 mLs by mouth daily as needed for mild constipation.     PRESCRIPTION MEDICATION  Apply 1 application topically. Compounded ativan 0.5mg  gel.  Apply 1mg /65ml twice daily as needed for anxiety        No orders of the defined types were placed in this encounter.    Immunization History  Administered Date(s) Administered  . Influenza Split 03/29/2012  . Influenza Whole 05/27/2009, 02/18/2010  . Influenza,inj,Quad PF,36+ Mos 04/24/2013  . Influenza-Unspecified 03/31/2014  . Pneumococcal Polysaccharide-23 05/20/2009, 03/29/2012  . Td 05/27/2009    Social History  Substance Use Topics  . Smoking status: Former Smoker -- 2.00 packs/day for 12 years    Types: Cigarettes    Quit date: 06/29/2000  . Smokeless tobacco: Current User    Types: Chew  . Alcohol Use: No    Review of    UTO 2/2 pt cooperation ;per nursing - noithing new    Filed Vitals:   08/18/15 1619  BP: 92/45  Pulse: 88  Temp: 98.7 F (37.1 C)  Resp: 18    Physical Exam  GENERAL APPEARANCE: sleeping, or pretending to No acute distress  SKIN: No diaphoresis rash HEENT: Unremarkable RESPIRATORY: Breathing is even, unlabored. Lung sounds are clear and full, amazing CARDIOVASCULAR: Heart RRR no murmurs, rubs or gallops. trace peripheral edema  GASTROINTESTINAL: Abdomen is soft, non-tender, not distended w/ normal bowel sounds.  GENITOURINARY:  Bladder non tender, not distended  MUSCULOSKELETAL: No abnormal joints or musculature NEUROLOGIC: Cranial nerves 2-12 grossly intact; moved upper extremitis PSYCHIATRIC:  no behavioral issues, pt has been mostly compliant with meds lately  Patient Active Problem List   Diagnosis Date Noted  . Chronic systolic CHF (congestive heart failure), NYHA class 4 (HCC) 08/18/2015  . Chronic respiratory failure with hypoxia and hypercapnia (HCC) 08/18/2015  .  Increased ammonia level 06/15/2015  . Systolic CHF, acute on chronic (HCC) 06/15/2015  . Impaired glucose tolerance 06/15/2015  . Lower leg edema 06/15/2015  . E. coli UTI 06/05/2015  . Anasarca 06/05/2015  . Acute on chronic respiratory failure with hypoxia and hypercapnia (HCC)   . Schizoaffective disorder (HCC) 05/29/2015  . Cellulitis and abscess of trunk 05/29/2015  . AKI (acute kidney injury) (HCC) 05/28/2015  . Sepsis (HCC) 05/28/2015  . Fluid overload 05/21/2015  . Sepsis due to Escherichia coli (E. coli) (HCC) 05/21/2015  . CHF with unknown LVEF (HCC)   . Pressure ulcer 05/19/2015  . Pickwickian syndrome (HCC) 05/19/2015  . Acute and chronic respiratory failure with hypercapnia (HCC) 05/14/2015  . Septic shock (HCC) 05/14/2015  . Acute respiratory failure (HCC) 05/14/2015  . Cellulitis of right breast 05/13/2015  . Acute renal failure (HCC) 05/13/2015  . Hyperkalemia 05/13/2015  . Hyponatremia 05/13/2015  . Acute encephalopathy 05/13/2015  . Hypertensive heart disease with CHF (congestive heart failure) (HCC) 01/01/2015  . Schizoaffective disorder, bipolar type (HCC) 08/07/2014  . COPD (chronic obstructive pulmonary disease) (HCC) 04/19/2013  . Atrial fibrillation (HCC) 03/28/2012  . Hypothyroidism 03/28/2012  . Diabetes mellitus (HCC) 03/28/2012  . Morbid obesity (HCC)   . Obstructive sleep apnea 01/22/2010  . LOW BACK PAIN SYNDROME 12/11/2007  . GERD 02/06/2007  . TARDIVE DYSKINESIA 08/10/2004    CBC    Component  Value Date/Time   WBC 4.3 06/01/2015 0256   WBC 5.7 01/30/2014   RBC 3.42* 06/01/2015 0256   RBC 2.70* 05/19/2011 0632   HGB 9.9* 06/01/2015 0256   HCT 35.3* 06/01/2015 0256   PLT 248 06/01/2015 0256   MCV 103.2* 06/01/2015 0256   LYMPHSABS 1.8 05/28/2015 1948   MONOABS 0.9 05/28/2015 1948   EOSABS 0.0 05/28/2015 1948   BASOSABS 0.0 05/28/2015 1948    CMP     Component Value Date/Time   NA 140 06/04/2015 0439   NA 141 01/30/2014   K 3.3* 06/04/2015 0439   CL 92* 06/04/2015 0439   CO2 38* 06/04/2015 0439   GLUCOSE 86 06/04/2015 0439   BUN 6 06/04/2015 0439   BUN 9 01/30/2014   CREATININE 0.89 06/04/2015 0439   CREATININE 0.5 01/30/2014   CALCIUM 9.3 06/04/2015 0439   PROT 6.2* 05/28/2015 1948   ALBUMIN 2.9* 05/28/2015 1948   AST 14* 05/28/2015 1948   ALT 9* 05/28/2015 1948   ALKPHOS 55 05/28/2015 1948   BILITOT 0.4 05/28/2015 1948   GFRNONAA >60 06/04/2015 0439   GFRAA >60 06/04/2015 0439    Assessment and Plan  Chronic systolic CHF (congestive heart failure), NYHA class 4 (HCC) Controlled at the moment as evidenced by clear lungs and minimal peripheral edema;plan - cont laix BID  Hypertensive heart disease with CHF (congestive heart failure) (HCC) On the low end but since pt is always lying down acceptable; pt refusing blood draws;cont lasix  Chronic respiratory failure with hypoxia and hypercapnia (HCC) Most of time pt doesn't wear her O2 but she does not appear in resp distress and extremities are not cool    Margit Hanks, MD

## 2015-08-18 ENCOUNTER — Encounter: Payer: Self-pay | Admitting: Internal Medicine

## 2015-08-18 DIAGNOSIS — J9611 Chronic respiratory failure with hypoxia: Secondary | ICD-10-CM | POA: Insufficient documentation

## 2015-08-18 DIAGNOSIS — J9612 Chronic respiratory failure with hypercapnia: Secondary | ICD-10-CM

## 2015-08-18 DIAGNOSIS — I5022 Chronic systolic (congestive) heart failure: Secondary | ICD-10-CM | POA: Insufficient documentation

## 2015-08-18 NOTE — Assessment & Plan Note (Signed)
On the low end but since pt is always lying down acceptable; pt refusing blood draws;cont lasix

## 2015-08-18 NOTE — Assessment & Plan Note (Signed)
Most of time pt doesn't wear her O2 but she does not appear in resp distress and extremities are not cool

## 2015-08-18 NOTE — Assessment & Plan Note (Signed)
Controlled at the moment as evidenced by clear lungs and minimal peripheral edema;plan - cont laix BID

## 2015-08-31 ENCOUNTER — Non-Acute Institutional Stay (SKILLED_NURSING_FACILITY): Payer: Medicare Other | Admitting: Internal Medicine

## 2015-08-31 ENCOUNTER — Encounter: Payer: Self-pay | Admitting: Internal Medicine

## 2015-08-31 DIAGNOSIS — J9622 Acute and chronic respiratory failure with hypercapnia: Secondary | ICD-10-CM | POA: Diagnosis not present

## 2015-08-31 DIAGNOSIS — I5022 Chronic systolic (congestive) heart failure: Secondary | ICD-10-CM

## 2015-09-07 NOTE — Progress Notes (Signed)
Patient ID: [name], female   DOB: 1950-08-08, 65 y.o.   MRN: [id number]    Facility:  [facility]       Allergies  Allergen Reactions  . Food Rash    "lasagna"= rash from the noodles  . Penicillins Hives    Per MAR- unable to ask quantification questions Can tolerate cephalosporins  . Sulfamethoxazole-Trimethoprim Hives and Nausea And Vomiting    Chief Complaint  Patient presents with  . Medical Management of Chronic Issues    HPI:  She is a long term resident of this facility being seen for the management of her chronic illnesses. She is unable to participate in the hpi or ros. There are no nursing concerns at this time. The focus of her care is comfort.   Past Medical History  Diagnosis Date  . Bipolar disorder (HCC)   . Schizoaffective disorder (HCC)   . Mood disorder (HCC)   . Epilepsy (HCC)   . Thyroid disease   . Hiatal hernia   . Atrial flutter (HCC)   . Schizoaffective disorder   . Urinary tract infection   . Hypertension   . GERD (gastroesophageal reflux disease)   . Hypothyroidism   . Obesity   . Asthma   . COPD (chronic obstructive pulmonary disease) (HCC)   . CHF (congestive heart failure) (HCC)   . Seizures (HCC)   . Morbid obesity (HCC)   . Chronic pain   . Hypercholesteremia   . Anginal pain (HCC)   . Myocardial infarction (HCC) 2002  . Diabetes mellitus     "I'm not diabetic anymore" (05/23/2012)  . Anemia   . History of blood transfusion 1993    "when I had my hysterectomy" (05/23/2012)  . H/O hiatal hernia   . Daily headache   . Arthritis     "severe; all over my body" (05/23/2012)  . Hypoventilation syndrome     [name] 05/30/2012  . Atrial fibrillation (HCC)   . Atrial flutter, paroxysmal (HCC)     [name] 05/30/2012  . Exertional dyspnea   . Shortness of breath     "all the time lately" (05/30/2012)  . Pneumonia     "several times; including now" (04/23/2013)  . Anxiety   . Episodic mood disorder (HCC)   . Psychosis   .  Schizophrenia (HCC)   . 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 CHOLELITHIASIS 11/30/2009    Qualifier: Diagnosis of  By: Brynda Rim      Past Surgical History  Procedure Laterality Date  . Tubal ligation  1998  . Cholecystectomy  ?2008  . Cardiac catheterization  2008  . Vaginal hysterectomy  1993    VITAL SIGNS BP 92/45 mmHg  Pulse 88  Ht 5\' 3"  (1.6 m)  Wt 395 lb (179.171 kg)  BMI 69.99 kg/m2  SpO2 95%  Patient's Medications  New Prescriptions   No medications on file  Previous Medications   BISACODYL (DULCOLAX) 10 MG SUPPOSITORY    Place 10 mg rectally every 3 (three) days as needed for mild constipation or moderate constipation.   CHLORPROMAZINE (THORAZINE) 50 MG TABLET    Take 50-100 mg by mouth. Take 50 mg twice daily (at 1200 and 1800) and take 100 mg at 2300   COLLAGENASE (SANTYL) OINTMENT    Apply topically daily. Apply to necrotic ulcer on b/l flanks and right shoulder   DIPHENHYDRAMINE (BENADRYL) 25 MG TABLET  Take 25 mg by mouth every 8 (eight) hours as needed for itching.   DIVALPROEX (DEPAKOTE ER) 250 MG 24 HR TABLET    Take 1,250 mg by mouth at bedtime.   FUROSEMIDE (LASIX) 20 MG TABLET    Take 2 tablets (40 mg total) by mouth 2 (two) times daily.   LACTULOSE (CHRONULAC) 10 GM/15ML SOLUTION    Take 30 mLs (20 g total) by mouth 3 (three) times daily.   LAMOTRIGINE (LAMICTAL) 100 MG TABLET    Take 100 mg by mouth 2 (two) times daily. For mood disorder   LEVOTHYROXINE (SYNTHROID, LEVOTHROID) 150 MCG TABLET    Take 150 mcg by mouth every morning. For thyroid disease.   LORAZEPAM (ATIVAN) 0.5 MG TABLET    Take one tablet by mouth three times daily as needed for anxiety   MAGNESIUM HYDROXIDE (MILK OF MAGNESIA) 400 MG/5ML SUSPENSION    Take 30 mLs by mouth daily as needed for mild constipation.   PRESCRIPTION MEDICATION    Apply 1 application topically. Compounded ativan 0.5mg  gel.  Apply /64ml twice daily  as needed for anxiety  Modified Medications   No medications on file  Discontinued Medications   No medications on file     SIGNIFICANT DIAGNOSTIC EXAMS    She is not a candidate for a mammogram or colonoscopy or dexa scan She has declined 2-d echo and venous doppler    05-10-14: chest x-ray: modest cardiomegaly with clear lungs   04-21-15: chest x-ray: cardiomegaly with mild chf.    LABS REVIEWED:   07-03-14 hgb a1c 4.7 07-08-14: stool for guaiac: neg  07-31-14: wbc 4.8; hgb 12.3; hct 41.1; mcv 106.6; plt 198 ;glucose 108; bun 9.3; creat 0.56; k+4.6; na++141; liver normal albumin 3.6  10-15-14: tsh 3.63; depakote 49  03-27-15: glucose 106; bun 7.3; creat 0.61; k+ 4.2; na++140 04-22-15: wbc 5.7 ;hgb 11.3; hct 39.6; mcv 103.5; plt 205; glucose 103; bun 7.1; creat 0.72; k+ 4.9; na++139; liver normal albumin 3.2     Review of Systems Unable to perform ROS: medical condition     Physical Exam Constitutional: No distress.  Morbid obesity   Eyes: Conjunctivae are normal.  Neck: Neck supple. No JVD present. No thyromegaly present.  Cardiovascular: Normal rate, regular rhythm and intact distal pulses.   Respiratory: Effort normal  No respiratory distress. She has no wheezes. breath sounds diminished GI: Soft. Bowel sounds are normal. She exhibits no distension. There is no tenderness.  Musculoskeletal: She exhibits no edema.  Able to move all extremities   Lymphadenopathy:    She has no cervical adenopathy.  Neurological: She is alert.  Skin: Skin is warm and dry. She is not diaphoretic.   Psychiatric: She has a normal mood and affect.     ASSESSMENT/ PLAN:  1. Bipolar disorder: will continue chlorpromazine 25 mg twice daily; depkaote 1250 mg daily; lamictal 100 mg twice daily  Ativan 0.5 mg every 4 hours as needed  2. Hypothyroidism: will continue synthroid 150 mcg daily  3. Chf: will continue lasix 40 mg twice daily   4. Hypertensive heart disease: is currently not on  medications will monitor  5. Chronic respiratory failure with hypercapnia: is on chronic 02 therapy. Has roxanol 5 mg every 4 hours as needed      Synthia Innocent NP [facility]  Contact [phone] Monday through Friday 8am- 5pm  After hours call [phone]

## 2015-09-11 NOTE — Progress Notes (Signed)
Patient ID: [name], female   DOB: 05-28-1951, 65 y.o.   MRN: [id number]    DATE: 03/23/15  Location:  [facility]    Place of Service: SNF (31)   Extended Emergency Contact Information Primary Emergency Contact: Maxwell,Clarance Address: [address] [place], [place] [address] [place] Phone: [phone] Relation: Friend Secondary Emergency Contact: Loma Newton, Seymour [place] Phone: [phone] Relation: Friend Guardian: Golonka,Patricia          [place] Phone: [phone] Relation: Sister  Advanced Directive information  FULL CODE  Chief Complaint  Patient presents with  . Medical Management of Chronic Issues    HPI:  65 yo female long term resident seen today for f/u. She has no c/o. She dips snuff. No nursing issues. No falls. Appetite ok. Sleeps well most nights. She is a poor historian due to psych issues. Hx obtained from chart  Hypothyroidism -  is stable tsh is 3.63. Takes synthroid 150 mcg daily   Afib - heart rate is stable on asa 81 mg daily   COPD with chronic respiratory failure -  is stable. o2 dependent; she is unable to use inhalers; and is unable to utilize neb treatments; will monitor   GERD - stable on prilosec 40 mg daily   Schizophrenia - stable. she does talk to herself; takes thorazine 50 mg twice daily and 125 mg nightly; depakote 1250 mg daily; and lamictal 100 mg twice daily to stabilize mood; ativan 0.5 mg three times daily as needed for anxiety, has ativan gel 1 mg twice daily as needed; followed by psych services  Diabetes - diet controlled. A1c 5.4% she is presently not on medications; will not make changes will monitor her status. Her hgb a1c is 4.7 ; she is unable to participate with urine for micro-albumin   Anemia - stable. her hgb is 11.2   Albumin 3.5. liver enzymes nml  Past Medical  History  Diagnosis Date  . Bipolar disorder ([place])   . Schizoaffective disorder ([place])   . Mood disorder ([place])   . Epilepsy ([place])   . Thyroid disease   . Hiatal hernia   . Atrial flutter ([place])   . Schizoaffective disorder   . Urinary tract infection   . Hypertension   . GERD (gastroesophageal reflux disease)   . Hypothyroidism   . Obesity   . Asthma   . COPD (chronic obstructive pulmonary disease) ([place])   . CHF (congestive heart failure) ([place])   . Seizures ([place])   . Morbid obesity ([place])   . Chronic pain   . Hypercholesteremia   . Anginal pain ([place])   . Myocardial infarction ([place]) 2002  . Diabetes mellitus     "I'm not diabetic anymore" (05/23/2012)  . Anemia   . History of blood transfusion 1993    "when I had my hysterectomy" (05/23/2012)  . H/O hiatal hernia   . Daily headache   . Arthritis     "severe; all over my body" (05/23/2012)  . Hypoventilation syndrome     Archie Endo 05/30/2012  . Atrial fibrillation ([place])   . Atrial flutter, paroxysmal ([place])     Archie Endo 05/30/2012  . Exertional dyspnea   . Shortness of breath     "all the time lately" (05/30/2012)  . Pneumonia     "  several times; including now" (04/23/2013)  . Anxiety   . Episodic mood disorder ([place])   . Psychosis   . Schizophrenia ([place])   . RETARDATION, MENTAL, MILD 02/06/2007    Qualifier: Diagnosis of  By: Radene Ou MD, [place]    . CHOLELITHIASIS 11/30/2009    Qualifier: Diagnosis of  By: Versie Starks      Past Surgical History  Procedure Laterality Date  . Tubal ligation  1998  . Cholecystectomy  ?2008  . Cardiac catheterization  2008  . Vaginal hysterectomy  1993    Patient Care Team: Hennie Duos, MD as PCP - General (Internal Medicine) Gerlene Fee, NP as Nurse Practitioner (Nurse Practitioner) [place] ([place])  Social History   Social History  . Marital Status: Widowed    Spouse Name: N/A  . Number of Children: N/A  . Years of  Education: N/A   Occupational History  . Not on file.   Social History Main Topics  . Smoking status: Former Smoker -- 2.00 packs/day for 12 years    Types: Cigarettes    Quit date: 06/29/2000  . Smokeless tobacco: Current User    Types: Chew  . Alcohol Use: No  . Drug Use: No  . Sexual Activity: Yes    Birth Control/ Protection: Post-menopausal   Other Topics Concern  . Not on file   Social History Narrative   ** Merged History Encounter **         reports that she quit smoking about 15 years ago. Her smoking use included Cigarettes. She has a 24 pack-year smoking history. Her smokeless tobacco use includes Chew. She reports that she does not drink alcohol or use illicit drugs.  Immunization History  Administered Date(s) Administered  . Influenza Split 03/29/2012  . Influenza Whole 05/27/2009, 02/18/2010  . Influenza,inj,Quad PF,36+ Mos 04/24/2013  . Influenza-Unspecified 03/31/2014  . Pneumococcal Polysaccharide-23 05/20/2009, 03/29/2012  . Td 05/27/2009    Allergies  Allergen Reactions  . Food Rash    "lasagna"= rash from the noodles  . Penicillins Hives    Per MAR- unable to ask quantification questions Can tolerate cephalosporins  . Sulfamethoxazole-Trimethoprim Hives and Nausea And Vomiting    Medications: Patient's Medications  New Prescriptions   COLLAGENASE (SANTYL) OINTMENT    Apply topically daily. Apply to necrotic ulcer on b/l flanks and right shoulder   LACTULOSE (CHRONULAC) 10 GM/15ML SOLUTION    Take 30 mLs (20 g total) by mouth 3 (three) times daily.  Previous Medications   BISACODYL (DULCOLAX) 10 MG SUPPOSITORY    Place 10 mg rectally every 3 (three) days as needed for mild constipation or moderate constipation.   CHLORPROMAZINE (THORAZINE) 50 MG TABLET    Take 50-100 mg by mouth. Take 50 mg twice daily (at 1200 and 1800) and take 100 mg at 2300   DIPHENHYDRAMINE (BENADRYL) 25 MG TABLET    Take 25 mg by mouth every 8 (eight) hours as needed for  itching.   DIVALPROEX (DEPAKOTE ER) 250 MG 24 HR TABLET    Take 1,250 mg by mouth at bedtime.   LAMOTRIGINE (LAMICTAL) 100 MG TABLET    Take 100 mg by mouth 2 (two) times daily. For mood disorder   LEVOTHYROXINE (SYNTHROID, LEVOTHROID) 150 MCG TABLET    Take 150 mcg by mouth every morning. For thyroid disease.   MAGNESIUM HYDROXIDE (MILK OF MAGNESIA) 400 MG/5ML SUSPENSION    Take 30 mLs by mouth daily as needed for  mild constipation.   PRESCRIPTION MEDICATION    Apply 1 application topically. Compounded ativan 0.43m gel.  Apply 14m20ml twice daily as needed for anxiety  Modified Medications   Modified Medication Previous Medication   FUROSEMIDE (LASIX) 20 MG TABLET furosemide (LASIX) 20 MG tablet      Take 2 tablets (40 mg total) by mouth 2 (two) times daily.    Take 1 tablet (20 mg total) by mouth daily.   LORAZEPAM (ATIVAN) 0.5 MG TABLET LORazepam (ATIVAN) 0.5 MG tablet      Take one tablet by mouth three times daily as needed for anxiety    Take one tablet by mouth three times daily as needed for anxiety  Discontinued Medications   ASPIRIN 81 MG CHEWABLE TABLET    Chew 81 mg by mouth daily.   ASPIRIN EC 81 MG TABLET    Take 81 mg by mouth every morning. For atril flutter   BISACODYL (DULCOLAX) 10 MG SUPPOSITORY    Place 10 mg rectally every three (3) days as needed for mild constipation or moderate constipation.   BUDESONIDE (PULMICORT) 0.25 MG/2ML NEBULIZER SOLUTION    Take 0.25 mg by nebulization 2 (two) times daily.   CHLORPROMAZINE (THORAZINE) 100 MG TABLET    Take 100 mg by mouth at bedtime.   CHLORPROMAZINE (THORAZINE) 200 MG TABLET    Take 0.5 tablets (100 mg total) by mouth at bedtime.   CHLORPROMAZINE (THORAZINE) 50 MG TABLET    Take 50 mg by mouth 3 (three) times daily as needed.   DIVALPROEX (DEPAKOTE ER) 500 MG 24 HR TABLET    Take 1,250 mg by mouth at bedtime. For mood disorder   LAMOTRIGINE (LAMICTAL) 100 MG TABLET    Take 100 mg by mouth 2 (two) times daily.   LEVOTHYROXINE  (SYNTHROID, LEVOTHROID) 150 MCG TABLET    Take 150 mcg by mouth daily before breakfast.   LORAZEPAM (ATIVAN) 0.5 MG TABLET    Take one tablet by mouth three times daily as needed for anxiety   LORAZEPAM (ATIVAN) 0.5 MG TABLET    Take 0.5 mg by mouth 3 (three) times daily as needed for anxiety.   LORAZEPAM (ATIVAN) 2 MG/ML CONCENTRATED SOLUTION    Take 1 mg by mouth 2 (two) times daily as needed for anxiety.   LURASIDONE (LATUDA) 40 MG TABS TABLET    Take 20 mg by mouth daily with breakfast.   MAGNESIUM HYDROXIDE (MILK OF MAGNESIA) 400 MG/5ML SUSPENSION    Take 30 mLs by mouth daily as needed for mild constipation.   MULTIPLE VITAMIN (MULTIVITAMIN WITH MINERALS) TABS TABLET    Take 1 tablet by mouth daily.   OMEPRAZOLE (PRILOSEC) 20 MG CAPSULE    Take 40 mg by mouth every morning. For reflux   OMEPRAZOLE (PRILOSEC) 40 MG CAPSULE    Take 40 mg by mouth daily.   PRESCRIPTION MEDICATION    Apply 1 mL topically 2 (two) times daily as needed (anxiety). Lorazepam 74m174mml topical gel    Review of Systems  Unable to perform ROS: Psychiatric disorder    Filed Vitals:   03/26/15 1153  BP: 132/78  Pulse: 82  Temp: 97.8 F (36.6 C)  Weight: 386 lb (175.088 kg)  SpO2: 96%   Body mass index is 68.39 kg/(m^2).  Physical Exam  Constitutional: She appears well-developed and well-nourished.  HENT:  Mouth/Throat: Oropharynx is clear and moist. No oropharyngeal exudate.  No tongue lesions noted  Eyes: Pupils are equal, round, and reactive to light.  No scleral icterus.  Neck: Neck supple. Carotid bruit is not present. No tracheal deviation present. No thyromegaly present.  Cardiovascular: Normal rate, regular rhythm and intact distal pulses.  Exam reveals no gallop and no friction rub.   Murmur (1/6 SEM) heard. +1 pitting LE edema b/l. No calf TTP  Pulmonary/Chest: Effort normal. No stridor. No respiratory distress. She has wheezes (end expiratory ). She has no rales.  Abdominal: Soft. Bowel sounds  are normal. She exhibits no distension and no mass. There is no hepatomegaly. There is tenderness (generalized). There is no rebound and no guarding.  Musculoskeletal: She exhibits edema.  Lymphadenopathy:    She has no cervical adenopathy.  Neurological: She is alert.  Skin: Skin is warm and dry. No rash noted.  Psychiatric: She has a normal mood and affect. Her behavior is normal.     Labs reviewed: No visits with results within 3 Month(s) from this visit. Latest known visit with results is:  Admission on 08/07/2014, Discharged on 08/07/2014  Component Date Value Ref Range Status  . WBC 08/07/2014 7.9  4.0 - 10.5 K/uL Final  . RBC 08/07/2014 3.88  3.87 - 5.11 MIL/uL Final  . Hemoglobin 08/07/2014 13.1  12.0 - 15.0 g/dL Final  . HCT 08/07/2014 40.2  36.0 - 46.0 % Final  . MCV 08/07/2014 103.6* 78.0 - 100.0 fL Final  . MCH 08/07/2014 33.8  26.0 - 34.0 pg Final  . MCHC 08/07/2014 32.6  30.0 - 36.0 g/dL Final  . RDW 08/07/2014 13.4  11.5 - 15.5 % Final  . Platelets 08/07/2014 181  150 - 400 K/uL Final  . Neutrophils Relative % 08/07/2014 56  43 - 77 % Final  . Neutro Abs 08/07/2014 4.4  1.7 - 7.7 K/uL Final  . Lymphocytes Relative 08/07/2014 33  12 - 46 % Final  . Lymphs Abs 08/07/2014 2.6  0.7 - 4.0 K/uL Final  . Monocytes Relative 08/07/2014 10  3 - 12 % Final  . Monocytes Absolute 08/07/2014 0.8  0.1 - 1.0 K/uL Final  . Eosinophils Relative 08/07/2014 1  0 - 5 % Final  . Eosinophils Absolute 08/07/2014 0.1  0.0 - 0.7 K/uL Final  . Basophils Relative 08/07/2014 0  0 - 1 % Final  . Basophils Absolute 08/07/2014 0.0  0.0 - 0.1 K/uL Final  . Sodium 08/07/2014 139  135 - 145 mmol/L Final  . Potassium 08/07/2014 3.9  3.5 - 5.1 mmol/L Final  . Chloride 08/07/2014 102  96 - 112 mmol/L Final  . CO2 08/07/2014 30  19 - 32 mmol/L Final  . Glucose, Bld 08/07/2014 98  70 - 99 mg/dL Final  . BUN 08/07/2014 11  6 - 23 mg/dL Final  . Creatinine, Ser 08/07/2014 0.74  0.50 - 1.10 mg/dL Final    . Calcium 08/07/2014 10.0  8.4 - 10.5 mg/dL Final  . Total Protein 08/07/2014 7.0  6.0 - 8.3 g/dL Final  . Albumin 08/07/2014 3.7  3.5 - 5.2 g/dL Final  . AST 08/07/2014 14  0 - 37 U/L Final  . ALT 08/07/2014 10  0 - 35 U/L Final  . Alkaline Phosphatase 08/07/2014 64  39 - 117 U/L Final  . Total Bilirubin 08/07/2014 0.5  0.3 - 1.2 mg/dL Final  . GFR calc non Af Amer 08/07/2014 90* >90 mL/min Final  . GFR calc Af Amer 08/07/2014 >90  >90 mL/min Final   Comment: (NOTE) The eGFR has been calculated using the CKD EPI equation. This calculation  has not been validated in all clinical situations. eGFR's persistently <90 mL/min signify possible Chronic Kidney Disease.   . Anion gap 08/07/2014 7  5 - 15 Final  . Opiates 08/07/2014 NONE DETECTED  NONE DETECTED Final  . Cocaine 08/07/2014 NONE DETECTED  NONE DETECTED Final  . Benzodiazepines 08/07/2014 NONE DETECTED  NONE DETECTED Final  . Amphetamines 08/07/2014 NONE DETECTED  NONE DETECTED Final  . Tetrahydrocannabinol 08/07/2014 NONE DETECTED  NONE DETECTED Final  . Barbiturates 08/07/2014 NONE DETECTED  NONE DETECTED Final   Comment:        DRUG SCREEN FOR MEDICAL PURPOSES ONLY.  IF CONFIRMATION IS NEEDED FOR ANY PURPOSE, NOTIFY LAB WITHIN 5 DAYS.        LOWEST DETECTABLE LIMITS FOR URINE DRUG SCREEN Drug Class       Cutoff (ng/mL) Amphetamine      1000 Barbiturate      200 Benzodiazepine   867 Tricyclics       619 Opiates          300 Cocaine          300 THC              50   . Alcohol, Ethyl (B) 08/07/2014 <5  0 - 9 mg/dL Final   Comment:        LOWEST DETECTABLE LIMIT FOR SERUM ALCOHOL IS 11 mg/dL FOR MEDICAL PURPOSES ONLY   . Valproic Acid Lvl 08/07/2014 49.7* 50.0 - 100.0 ug/mL Final   Performed at [facility]  . Color, Urine 08/07/2014 YELLOW  YELLOW Final  . APPearance 08/07/2014 CLEAR  CLEAR Final  . Specific Gravity, Urine 08/07/2014 1.011  1.005 - 1.030 Final  . pH 08/07/2014 7.5  5.0 - 8.0 Final  .  Glucose, UA 08/07/2014 NEGATIVE  NEGATIVE mg/dL Final  . Hgb urine dipstick 08/07/2014 NEGATIVE  NEGATIVE Final  . Bilirubin Urine 08/07/2014 NEGATIVE  NEGATIVE Final  . Ketones, ur 08/07/2014 NEGATIVE  NEGATIVE mg/dL Final  . Protein, ur 08/07/2014 NEGATIVE  NEGATIVE mg/dL Final  . Urobilinogen, UA 08/07/2014 1.0  0.0 - 1.0 mg/dL Final  . Nitrite 08/07/2014 NEGATIVE  NEGATIVE Final  . Leukocytes, UA 08/07/2014 NEGATIVE  NEGATIVE Final   MICROSCOPIC NOT DONE ON URINES WITH NEGATIVE PROTEIN, BLOOD, LEUKOCYTES, NITRITE, OR GLUCOSE <1000 mg/dL.    No results found.   Assessment/Plan   ICD-9-CM ICD-10-CM   1. Bilateral edema of lower extremity 782.3 R60.0   2. Essential hypertension 401.9 I10   3. Schizoaffective disorder, bipolar type ([place]) 295.70 F25.0   4. Type 2 diabetes mellitus without complication, without long-term current use of insulin (HCC) 250.00 E11.9   5. Chronic obstructive pulmonary disease, unspecified COPD, unspecified chronic bronchitis type 496 J44.9   6. Paroxysmal atrial fibrillation (HCC) 427.31 I48.0   7. Anemia, unspecified anemia type 285.9 D64.9   8. Hypothyroidism, unspecified hypothyroidism type 244.9 E03.9     Start furosemide 47m daily x 3. Check BMP on 10/7th  Cont other meds as ordered  [place] O2 @ 2L/min  PT/OT/ST as indicated  Psych following  Tobacco use cessation discussed and highly urged  Will follow   Lilinoe Acklin S. CPerlie Gold PCharleston Surgical Hospitaland Adult Medicine [address]GF[place]orence [place] [address]([phone] (Monday-Friday 8 AM - 5 PM) ([phone] 5 PM and follow prompts

## 2015-09-19 NOTE — Assessment & Plan Note (Signed)
The underlying cause of [name]'s death; it was expected.

## 2015-09-19 NOTE — Progress Notes (Signed)
MRN: [id number] Name: [name]  Sex: female Age: 65 y.o. DOB: Oct 13, 1950  PSC #: Ronni Rumble Facility/Room:203 Level Of Care: SNF Provider: Merrilee Seashore D Emergency Contacts: Extended Emergency Contact Information Primary Emergency Contact: Maxwell,Clarance Address: [address]          [place], [place] [address] [place] Phone: [phone] Relation: Friend Secondary Emergency Contact: Virl Son, [place] Phone: [phone] Relation: Friend Guardian: Seehafer,Patricia          [place] Home Phone: [phone] Relation: Sister  Code Status:   Allergies: Food; Penicillins; and Sulfamethoxazole-trimethoprim  Chief Complaint  Patient presents with  . Acute Visit    HPI: Patient is 65 y.o. female with NYHA Class 4 CHF who I  was first contacted about per phone from Horald Pollen, former DON and POA for Mrs Behlke. She found pt in her room , she had coughed up some green sputum and she wasn't breathing well. Pt was a DNR. Once at SNF I saw pt was wearing O2 2L (because she was a a big retainer) with very ineffective breathing. She was repositioned for better breathing. She did not have a radial pulse, she did have a faint femoral pulse. Mrs Thad Ranger and I discussing what and if to intervene when pt took her last breath.   Past Medical History  Diagnosis Date  . Bipolar disorder (HCC)   . Schizoaffective disorder (HCC)   . Mood disorder (HCC)   . Epilepsy (HCC)   . Thyroid disease   . Hiatal hernia   . Atrial flutter (HCC)   . Schizoaffective disorder   . Urinary tract infection   . Hypertension   . GERD (gastroesophageal reflux disease)   . Hypothyroidism   . Obesity   . Asthma   . COPD (chronic obstructive pulmonary disease) (HCC)   . CHF (congestive heart failure) (HCC)   . Seizures (HCC)   . Morbid obesity (HCC)   . Chronic pain   .  Hypercholesteremia   . Anginal pain (HCC)   . Myocardial infarction (HCC) 2002  . Diabetes mellitus     "I'm not diabetic anymore" (05/23/2012)  . Anemia   . History of blood transfusion 1993    "when I had my hysterectomy" (05/23/2012)  . H/O hiatal hernia   . Daily headache   . Arthritis     "severe; all over my body" (05/23/2012)  . Hypoventilation syndrome     [name] 05/30/2012  . Atrial fibrillation (HCC)   . Atrial flutter, paroxysmal (HCC)     [name] 05/30/2012  . Exertional dyspnea   . Shortness of breath     "all the time lately" (05/30/2012)  . Pneumonia     "several times; including now" (04/23/2013)  . Anxiety   . Episodic mood disorder (HCC)   . Psychosis   . Schizophrenia (HCC)   . 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 CHOLELITHIASIS 11/30/2009    Qualifier: Diagnosis of  By: Brynda Rim      Past Surgical History  Procedure Laterality Date  . Tubal ligation  1998  . Cholecystectomy  ?2008  . Cardiac catheterization  2008  . Vaginal hysterectomy  1993      Medication List       This list is accurate as of: 2015-09-08  8:01 PM.  Always use your most recent med list.               bisacodyl 10 MG suppository  Commonly known as:  DULCOLAX  Place 10 mg rectally every 3 (three) days as needed for mild constipation or moderate constipation.     chlorproMAZINE 50 MG tablet  Commonly known as:  THORAZINE  Take 50-100 mg by mouth. Take 50 mg twice daily (at 1200 and 1800) and take 100 mg at 2300     collagenase ointment  Commonly known as:  SANTYL  Apply topically daily. Apply to necrotic ulcer on b/l flanks and right shoulder     diphenhydrAMINE 25 MG tablet  Commonly known as:  BENADRYL  Take 25 mg by mouth every 8 (eight) hours as needed for itching.     divalproex 250 MG 24 hr tablet  Commonly known as:  DEPAKOTE ER  Take 1,250 mg by mouth at bedtime.     furosemide 20 MG tablet   Commonly known as:  LASIX  Take 2 tablets (40 mg total) by mouth 2 (two) times daily.     lactulose 10 GM/15ML solution  Commonly known as:  CHRONULAC  Take 30 mLs (20 g total) by mouth 3 (three) times daily.     lamoTRIgine 100 MG tablet  Commonly known as:  LAMICTAL  Take 100 mg by mouth 2 (two) times daily. For mood disorder     levothyroxine 150 MCG tablet  Commonly known as:  SYNTHROID, LEVOTHROID  Take 150 mcg by mouth every morning. For thyroid disease.     LORazepam 0.5 MG tablet  Commonly known as:  ATIVAN  Take one tablet by mouth three times daily as needed for anxiety     MILK OF MAGNESIA 400 MG/5ML suspension  Generic drug:  magnesium hydroxide  Take 30 mLs by mouth daily as needed for mild constipation.     PRESCRIPTION MEDICATION  Apply 1 application topically. Compounded ativan 0.5mg  gel.  Apply 1mg /74ml twice daily as needed for anxiety        No orders of the defined types were placed in this encounter.    Immunization History  Administered Date(s) Administered  . Influenza Split 03/29/2012  . Influenza Whole 05/27/2009, 02/18/2010  . Influenza,inj,Quad PF,36+ Mos 04/24/2013  . Influenza-Unspecified 03/31/2014  . Pneumococcal Polysaccharide-23 05/20/2009, 03/29/2012  . Td 05/27/2009    Social History  Substance Use Topics  . Smoking status: Former Smoker -- 2.00 packs/day for 12 years    Types: Cigarettes    Quit date: 06/29/2000  . Smokeless tobacco: Current User    Types: Chew  . Alcohol Use: No    Review of Systems  UTO    Filed Vitals:   09/18/15 1839  BP: 70/40  Pulse: 60  Temp: 98 F (36.7 C)  Resp: 12    Physical Exam  GENERAL APPEARANCE: very obese WF  SKIN:blue tinged skin, cool HEENT: Unremarkable RESPIRATORY: Breathing is mild labored, slow, one out of three breaths actually causes BS to be heardLung sounds are rhonchi  CARDIOVASCULAR: Heart RRR no murmurs, rubs or gallops. 1+ peripheral edema  GASTROINTESTINAL:  Abdomen is soft, non-tender, not distended w/ no bowel sounds.  GENITOURINARY: Bladder non tender, not distended  MUSCULOSKELETAL: No abnormal joints or musculature NEUROLOGIC: pt did not move or respond PSYCHIATRIC: Mood and affect appropriate to situation, no behavioral issues  Patient Active Problem List   Diagnosis Date Noted  . Chronic systolic CHF (congestive  heart failure), NYHA class 4 (HCC) 08/18/2015  . Chronic respiratory failure with hypoxia and hypercapnia (HCC) 08/18/2015  . Increased ammonia level 06/15/2015  . Systolic CHF, acute on chronic (HCC) 06/15/2015  . Impaired glucose tolerance 06/15/2015  . Lower leg edema 06/15/2015  . E. coli UTI 06/05/2015  . Anasarca 06/05/2015  . Acute on chronic respiratory failure with hypoxia and hypercapnia (HCC)   . Schizoaffective disorder (HCC) 05/29/2015  . Cellulitis and abscess of trunk 05/29/2015  . AKI (acute kidney injury) (HCC) 05/28/2015  . Sepsis (HCC) 05/28/2015  . Fluid overload 05/21/2015  . Sepsis due to Escherichia coli (E. coli) (HCC) 05/21/2015  . CHF with unknown LVEF (HCC)   . Pressure ulcer 05/19/2015  . Pickwickian syndrome (HCC) 05/19/2015  . Acute and chronic respiratory failure with hypercapnia (HCC) 05/14/2015  . Septic shock (HCC) 05/14/2015  . Acute respiratory failure (HCC) 05/14/2015  . Cellulitis of right breast 05/13/2015  . Acute renal failure (HCC) 05/13/2015  . Hyperkalemia 05/13/2015  . Hyponatremia 05/13/2015  . Acute encephalopathy 05/13/2015  . Hypertensive heart disease with CHF (congestive heart failure) (HCC) 01/01/2015  . Schizoaffective disorder, bipolar type (HCC) 08/07/2014  . COPD (chronic obstructive pulmonary disease) (HCC) 04/19/2013  . Atrial fibrillation (HCC) 03/28/2012  . Hypothyroidism 03/28/2012  . Diabetes mellitus (HCC) 03/28/2012  . Morbid obesity (HCC)   . Obstructive sleep apnea 01/22/2010  . LOW BACK PAIN SYNDROME 12/11/2007  . GERD 02/06/2007  . TARDIVE  DYSKINESIA 08/10/2004    CBC    Component Value Date/Time   WBC 4.3 06/01/2015 0256   WBC 5.7 01/30/2014   RBC 3.42* 06/01/2015 0256   RBC 2.70* 05/19/2011 0632   HGB 9.9* 06/01/2015 0256   HCT 35.3* 06/01/2015 0256   PLT 248 06/01/2015 0256   MCV 103.2* 06/01/2015 0256   LYMPHSABS 1.8 05/28/2015 1948   MONOABS 0.9 05/28/2015 1948   EOSABS 0.0 05/28/2015 1948   BASOSABS 0.0 05/28/2015 1948    CMP     Component Value Date/Time   NA 140 06/04/2015 0439   NA 141 01/30/2014   K 3.3* 06/04/2015 0439   CL 92* 06/04/2015 0439   CO2 38* 06/04/2015 0439   GLUCOSE 86 06/04/2015 0439   BUN 6 06/04/2015 0439   BUN 9 01/30/2014   CREATININE 0.89 06/04/2015 0439   CREATININE 0.5 01/30/2014   CALCIUM 9.3 06/04/2015 0439   PROT 6.2* 05/28/2015 1948   ALBUMIN 2.9* 05/28/2015 1948   AST 14* 05/28/2015 1948   ALT 9* 05/28/2015 1948   ALKPHOS 55 05/28/2015 1948   BILITOT 0.4 05/28/2015 1948   GFRNONAA >60 06/04/2015 0439   GFRAA >60 06/04/2015 0439     Assessment and Plan  Acute and chronic respiratory failure with hypercapnia (HCC) 2/2 chronic systolic CHF NYHA class 4; pt retained a lot, I;m sure her CO2 was very high today. Heart and lungs gave out, it had been expected. Nursing  may could have been more responsive but Renee was non compliant and was going to have it her way and she did. None of it changed the ultimate out come. Pt was pronounced dead at 1040.   Chronic systolic CHF (congestive heart failure), NYHA class 4 (HCC) The underlying cause of Brigett's death; it was expected.  I spoke with Mrs Thad Ranger for a while , then called pt's sister, Rowland Lathe  Time spent > 45 min. Most of that time spent with pt  Margit Hanks, MD

## 2015-09-19 NOTE — Assessment & Plan Note (Signed)
2/2 chronic systolic CHF NYHA class 4; pt retained a lot, I;m sure her CO2 was very high today. Heart and lungs gave out, it had been expected. Nursing  may could have been more responsive but [name] was non compliant and was going to have it her way and she did. None of it changed the ultimate out come. Pt was pronounced dead at 1040.

## 2015-09-19 DEATH — deceased

## 2016-09-09 IMAGING — DX DG CHEST 1V PORT
1 series · 1 of 1 positions shown · non-contrast
Comparison: 05/23/2013

CLINICAL DATA: Hypoxia. Diabetes. Shortness of breath. Former
smoker for 14 years.

EXAM:
PORTABLE CHEST 1 VIEW

[chest ap]
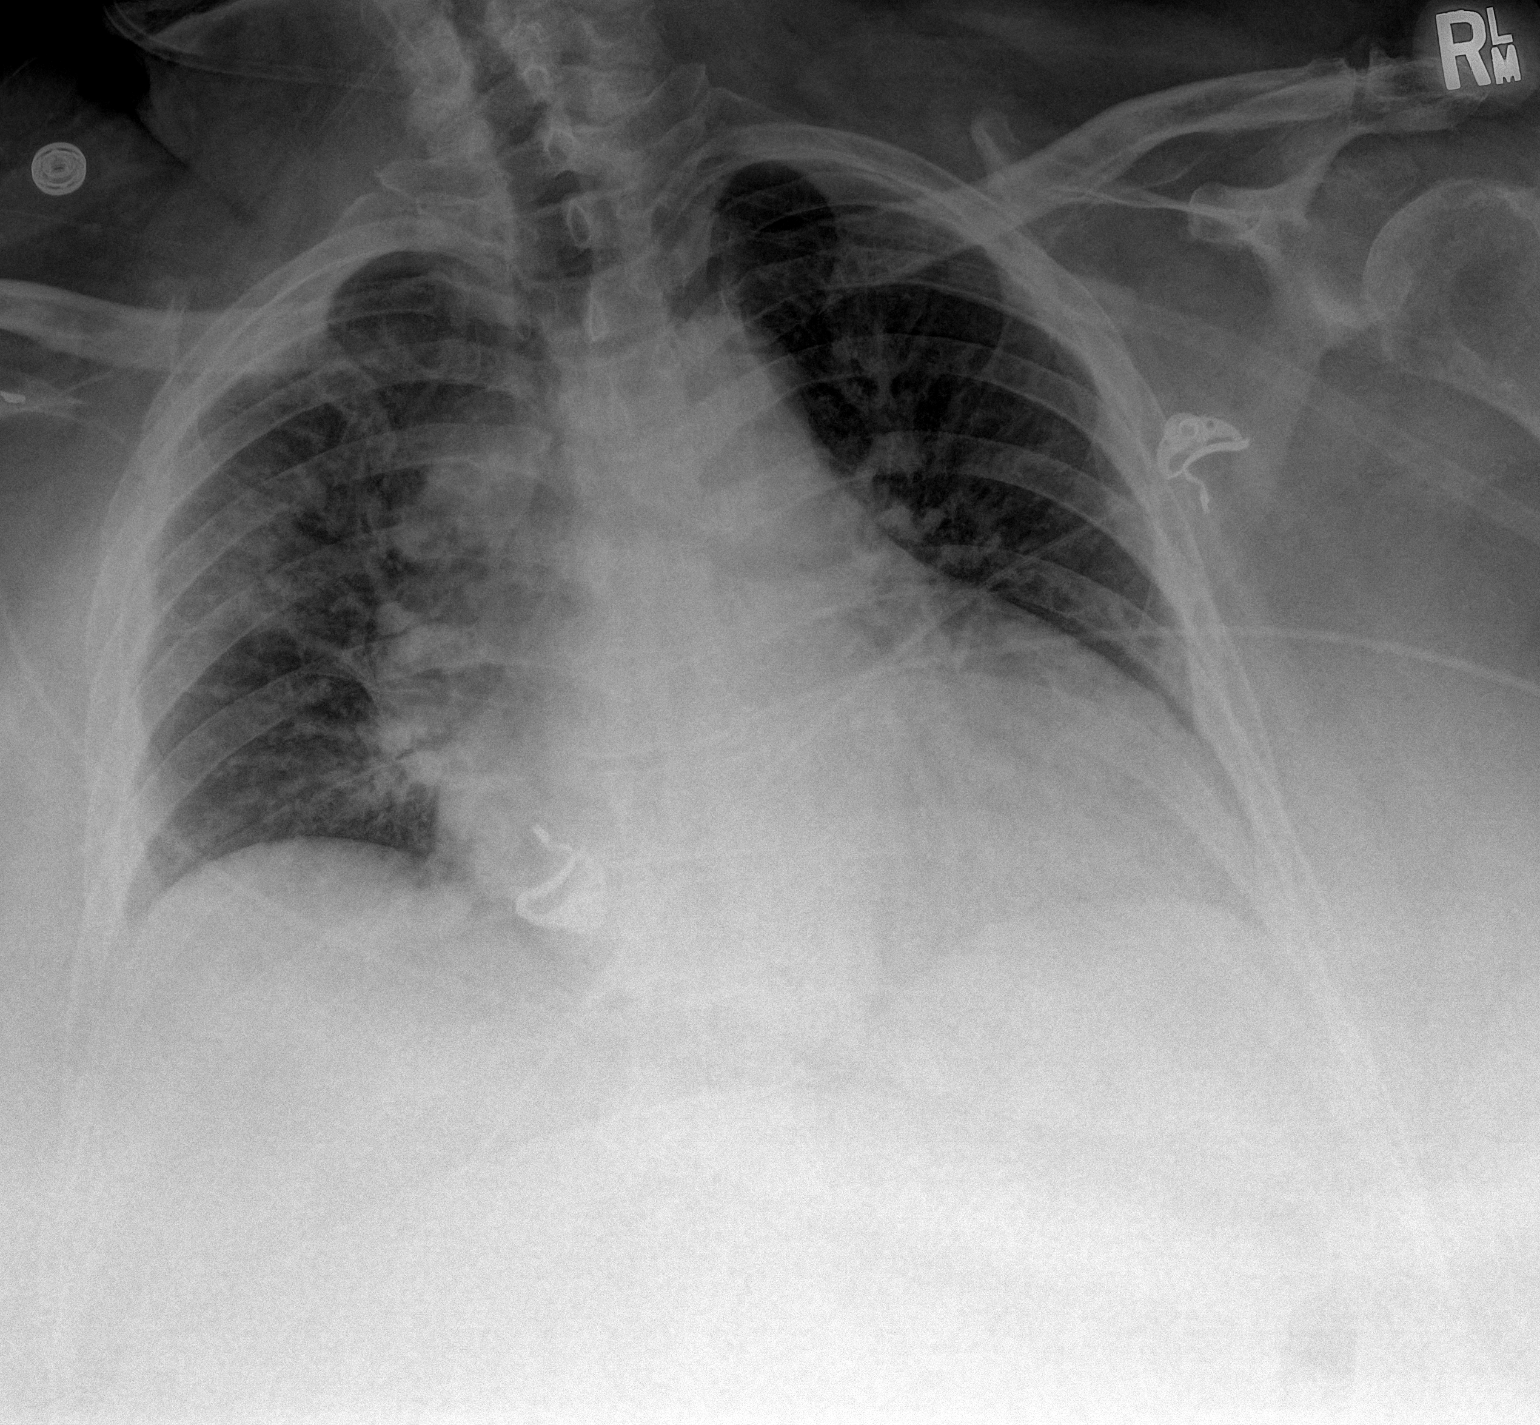

[1 of 1 positions shown; findings below may reference images not displayed]

FINDINGS: Shallow inspiration. Cardiac enlargement without significant
vascular congestion. Mild volume loss in the right upper lung is
probably due to shallow inspiration. No focal consolidation or
airspace disease is appreciated. No blunting of costophrenic angles.
No pneumothorax. Mediastinal contours appear intact.
IMPRESSION: Cardiac enlargement.  Shallow inspiration.  No focal consolidation.

## 2016-09-10 IMAGING — DX DG CHEST 1V PORT
1 series · 1 of 1 positions shown · non-contrast
Comparison: Chest radiograph performed 05/13/2015

CLINICAL DATA: Central line and orogastric tube placement. Altered
mental status. Initial encounter.

EXAM:
PORTABLE CHEST 1 VIEW

[chest ap]
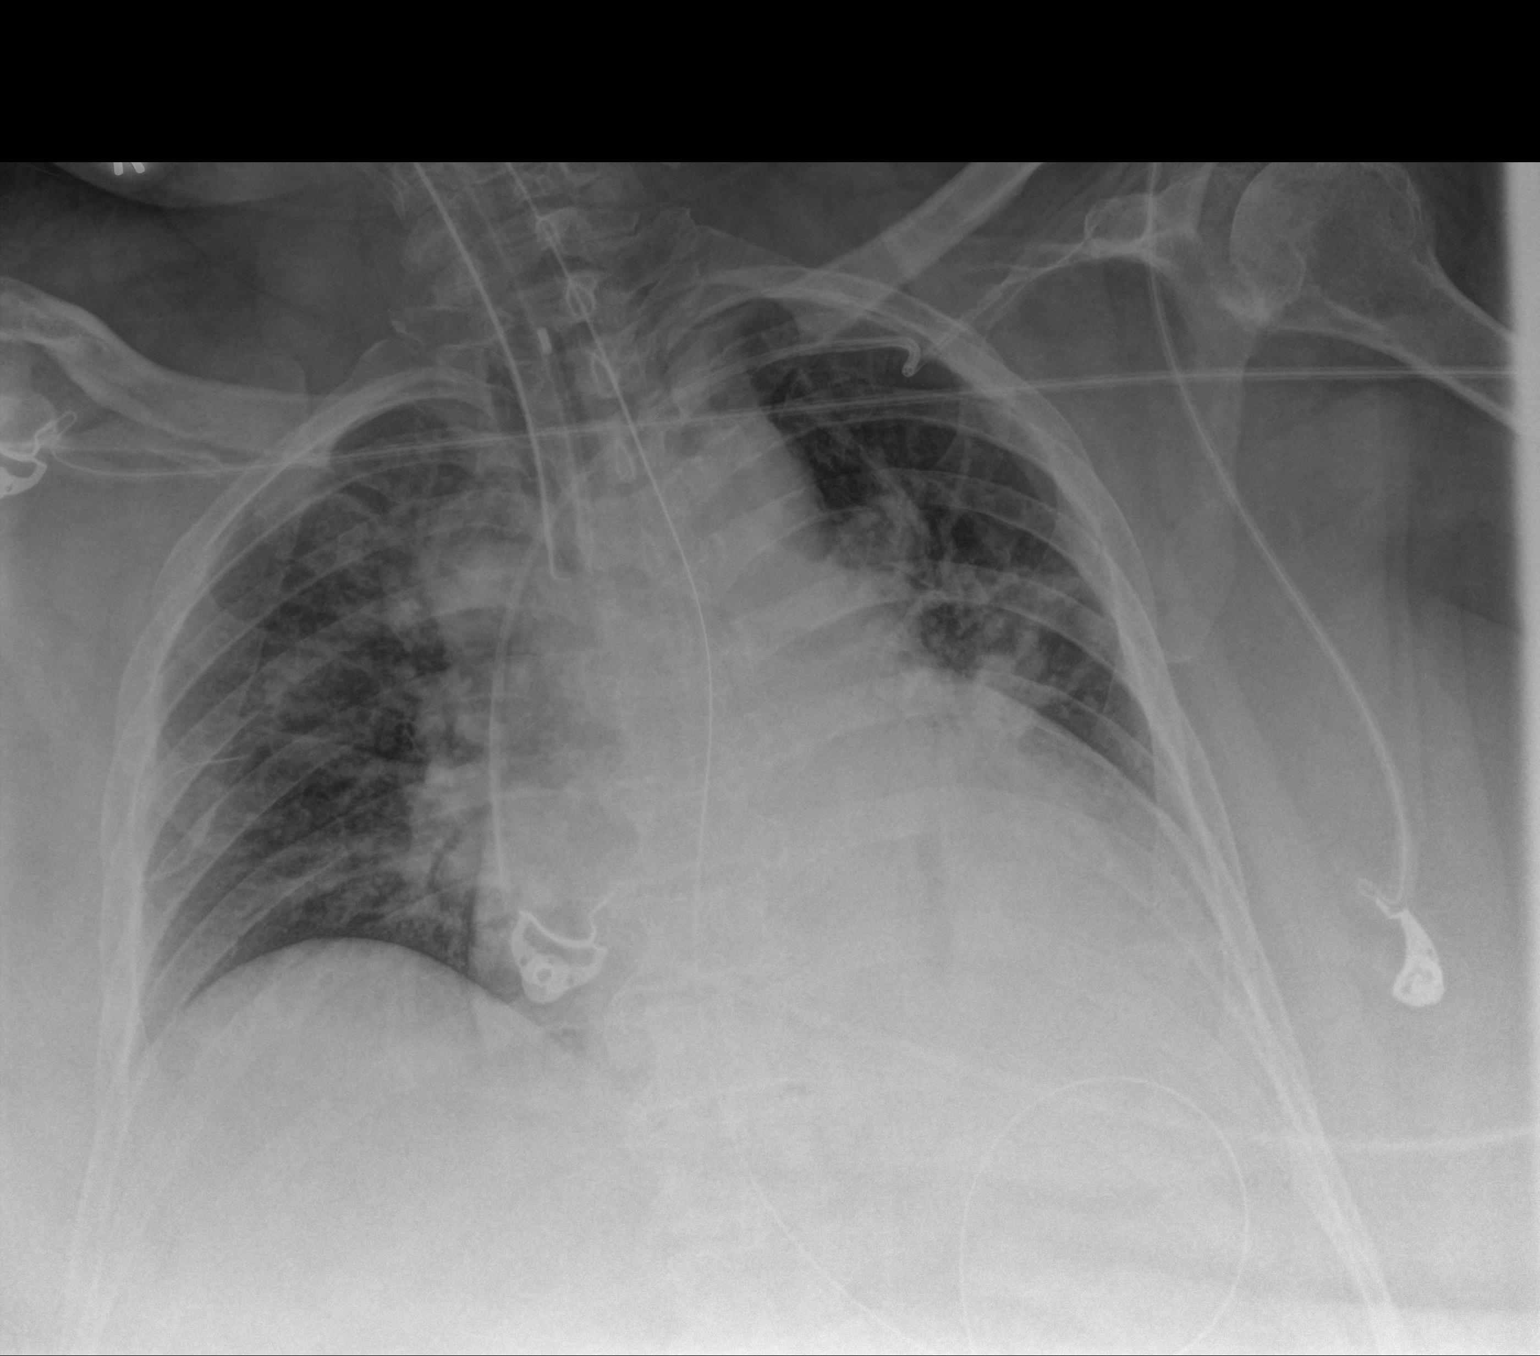

[1 of 1 positions shown; findings below may reference images not displayed]

FINDINGS: The patient's endotracheal tube is seen ending just above the
carina. This should be retracted 2-3 cm. An enteric tube is noted
extending below the diaphragm.

Patchy bilateral airspace opacification may reflect atelectasis. No
definite pleural effusion or pneumothorax is seen.

The cardiomediastinal silhouette is enlarged. No acute osseous
abnormalities are identified.
IMPRESSION: 1. Endotracheal tube seen ending just above the carina. This should
be retracted 2-3 cm.
2. Patchy bilateral airspace opacification may reflect atelectasis.
Cardiomegaly noted.

## 2016-09-11 IMAGING — DX DG CHEST 1V PORT
1 series · 1 of 1 positions shown · non-contrast
Comparison: 05/14/2015.

CLINICAL DATA: Intubation.

EXAM:
PORTABLE CHEST 1 VIEW

[chest ap]
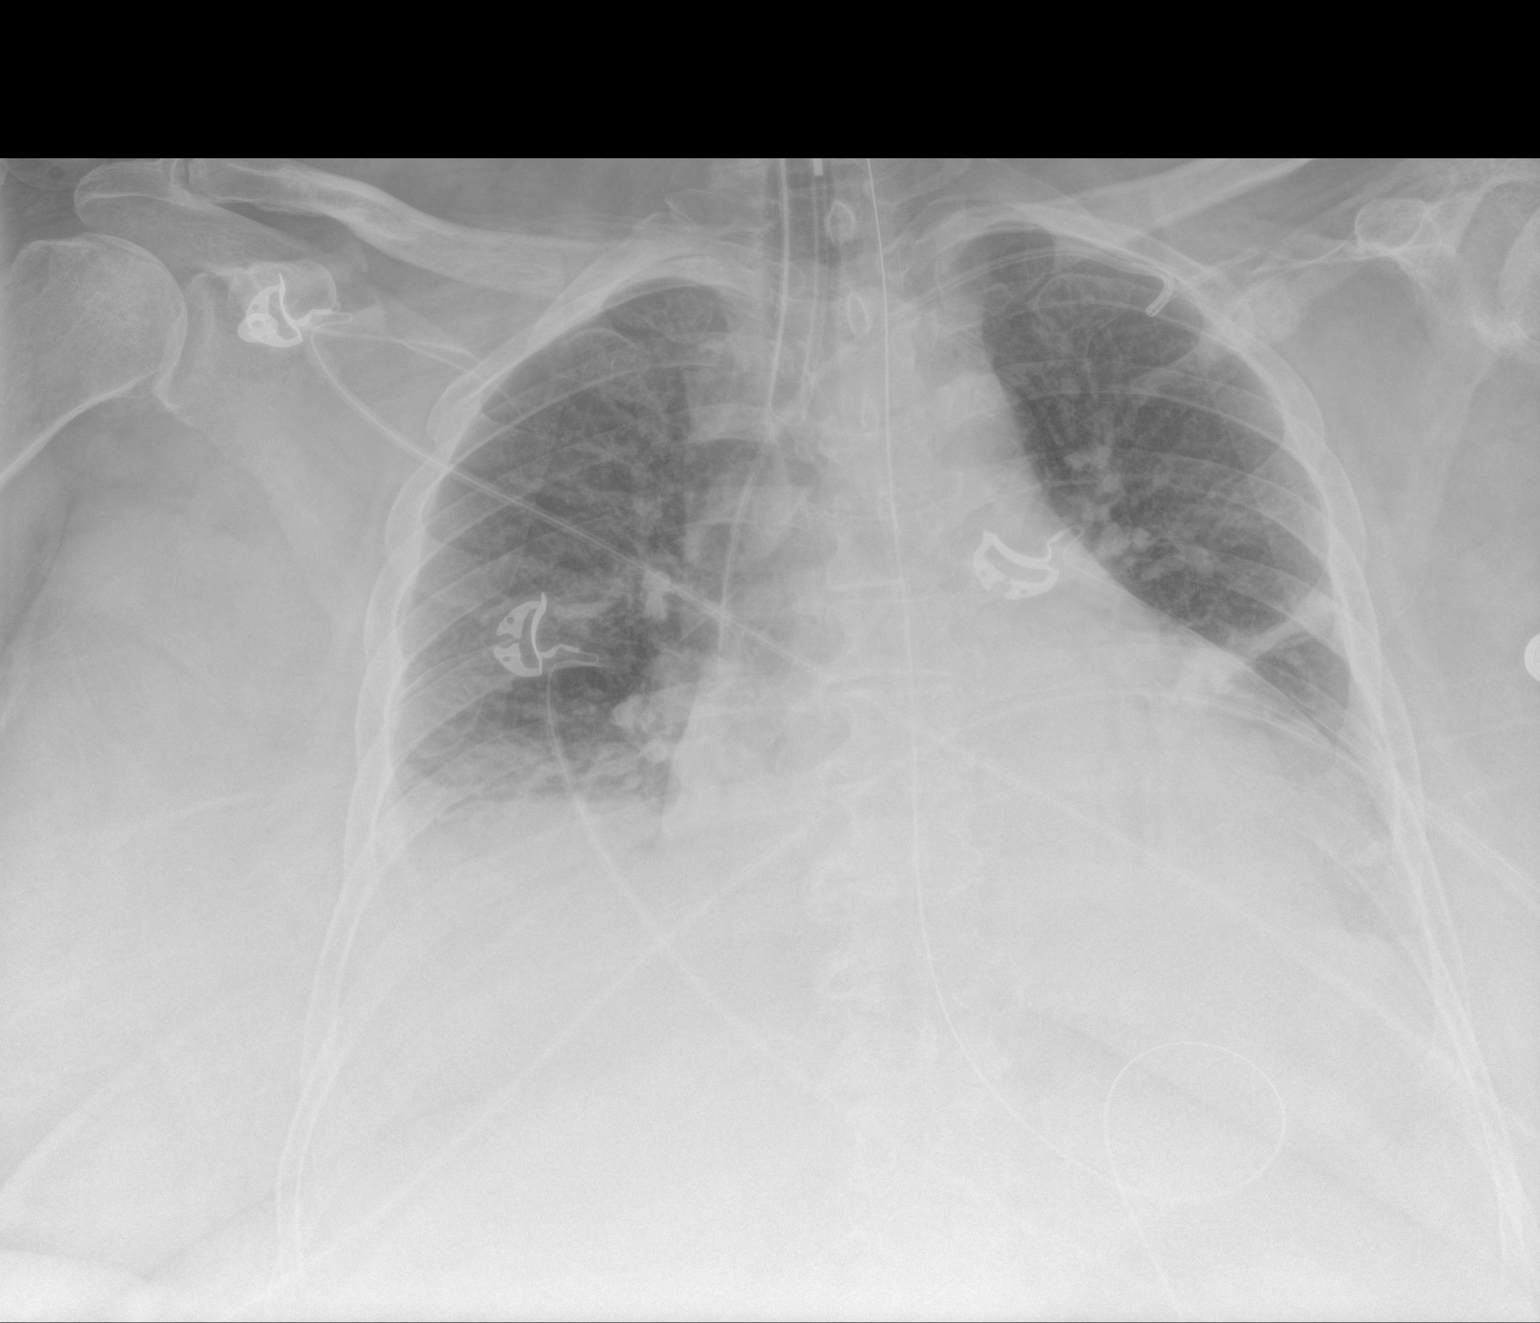

[1 of 1 positions shown; findings below may reference images not displayed]

FINDINGS: Endotracheal tube 3 cm above the carina. Left subclavian central
line stable position. NG tube in stable position. Mediastinum and
hilar structures are unremarkable. Cardiomegaly with normal
pulmonary vascularity. Bibasilar subsegmental atelectasis and/or
infiltrates. Small right pleural effusion cannot be excluded. No
pneumothorax .
IMPRESSION: 1. Endotracheal tube 3 cm above the carina. Remaining lines and
tubes in stable position.
2. Bibasilar subsegmental atelectasis and/or mild infiltrates.
3. Stable cardiomegaly .

## 2016-09-12 IMAGING — DX DG CHEST 1V PORT
1 series · 1 of 1 positions shown · non-contrast
Comparison: Prior chest x-ray 05/15/2015

CLINICAL DATA: 64-year-old female currently intubated

EXAM:
PORTABLE CHEST 1 VIEW

[chest ap]
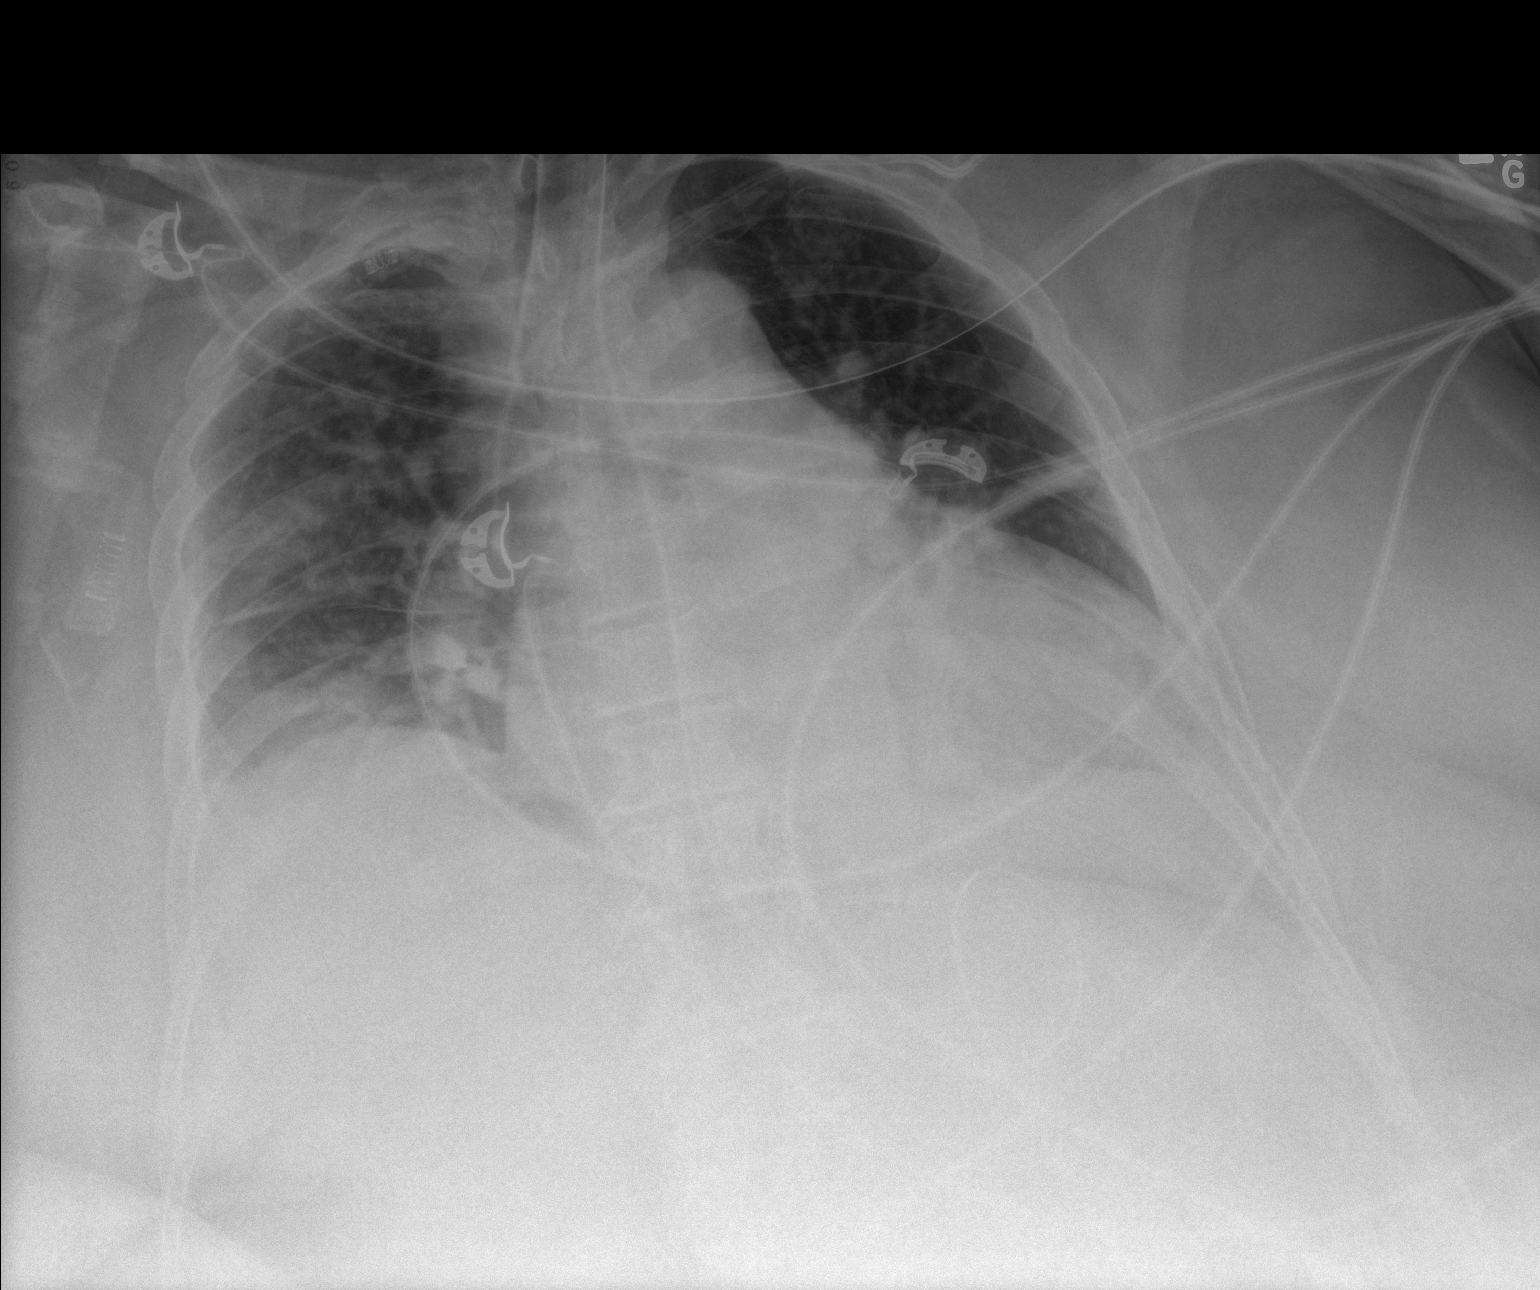

[1 of 1 positions shown; findings below may reference images not displayed]

FINDINGS: The tip of the endotracheal tube is 2.4 cm above the carina. A
nasogastric tube is present and is coiled within the gastric body.
Left subclavian approach central venous catheter remains in
unchanged position with the tip overlying the superior cavoatrial
junction. Persistent low inspiratory volumes with bibasilar
atelectasis versus infiltrate. Stable mild cardiomegaly. Slight
blurring of the interstitial markings likely secondary to patient
motion. Overall, no significant interval change in the appearance of
the chest. No acute osseous abnormality.
IMPRESSION: 1. The tip the endotracheal tube is 2.4 cm above the carina. Other
support apparatus also in satisfactory and unchanged position.
2. No significant interval change in the appearance of the lungs
with persistent low inspiratory volumes and bibasilar atelectasis
versus infiltrate.
3. Unchanged borderline cardiomegaly.

## 2016-09-13 IMAGING — DX DG CHEST 1V PORT
1 series · 1 of 1 positions shown · non-contrast
Comparison: 05/16/2015

CLINICAL DATA: Endotracheal tube.  COPD.  Asthma

EXAM:
PORTABLE CHEST 1 VIEW

[chest ap]
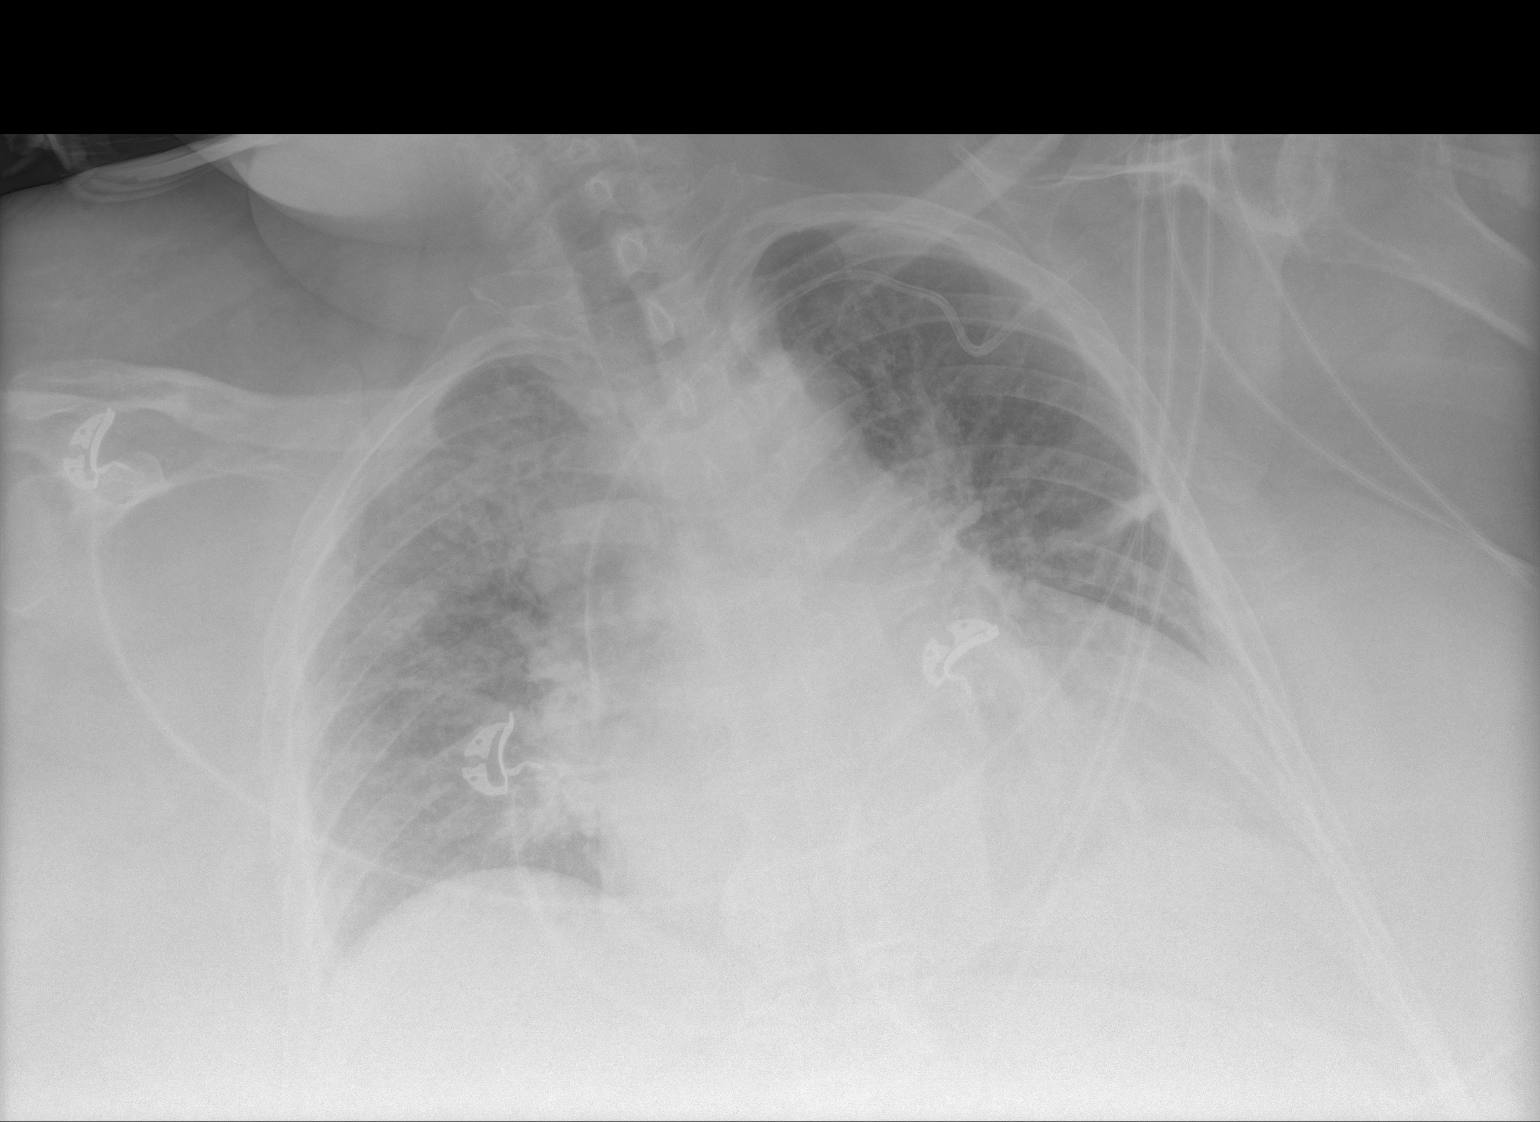

[1 of 1 positions shown; findings below may reference images not displayed]

FINDINGS: Interval extubation and removal of NG tube. Stable enlarged cardiac
silhouette. There is mild increase in fine airspace disease in the
upper lobes. Low lung volumes. No increase in atelectasis. LEFT
central venous line unchanged.
IMPRESSION: 1. Stable lung volumes following extubation.
2. Slight increase in upper lobe airspace disease suggests mild
pulmonary edema.

## 2016-09-24 IMAGING — CR DG CHEST 1V PORT
1 series · 1 of 1 positions shown · non-contrast
Comparison: 05/17/2015.

CLINICAL DATA: Hypoxia, decreased O2 sats.

EXAM:
PORTABLE CHEST 1 VIEW

[AP]
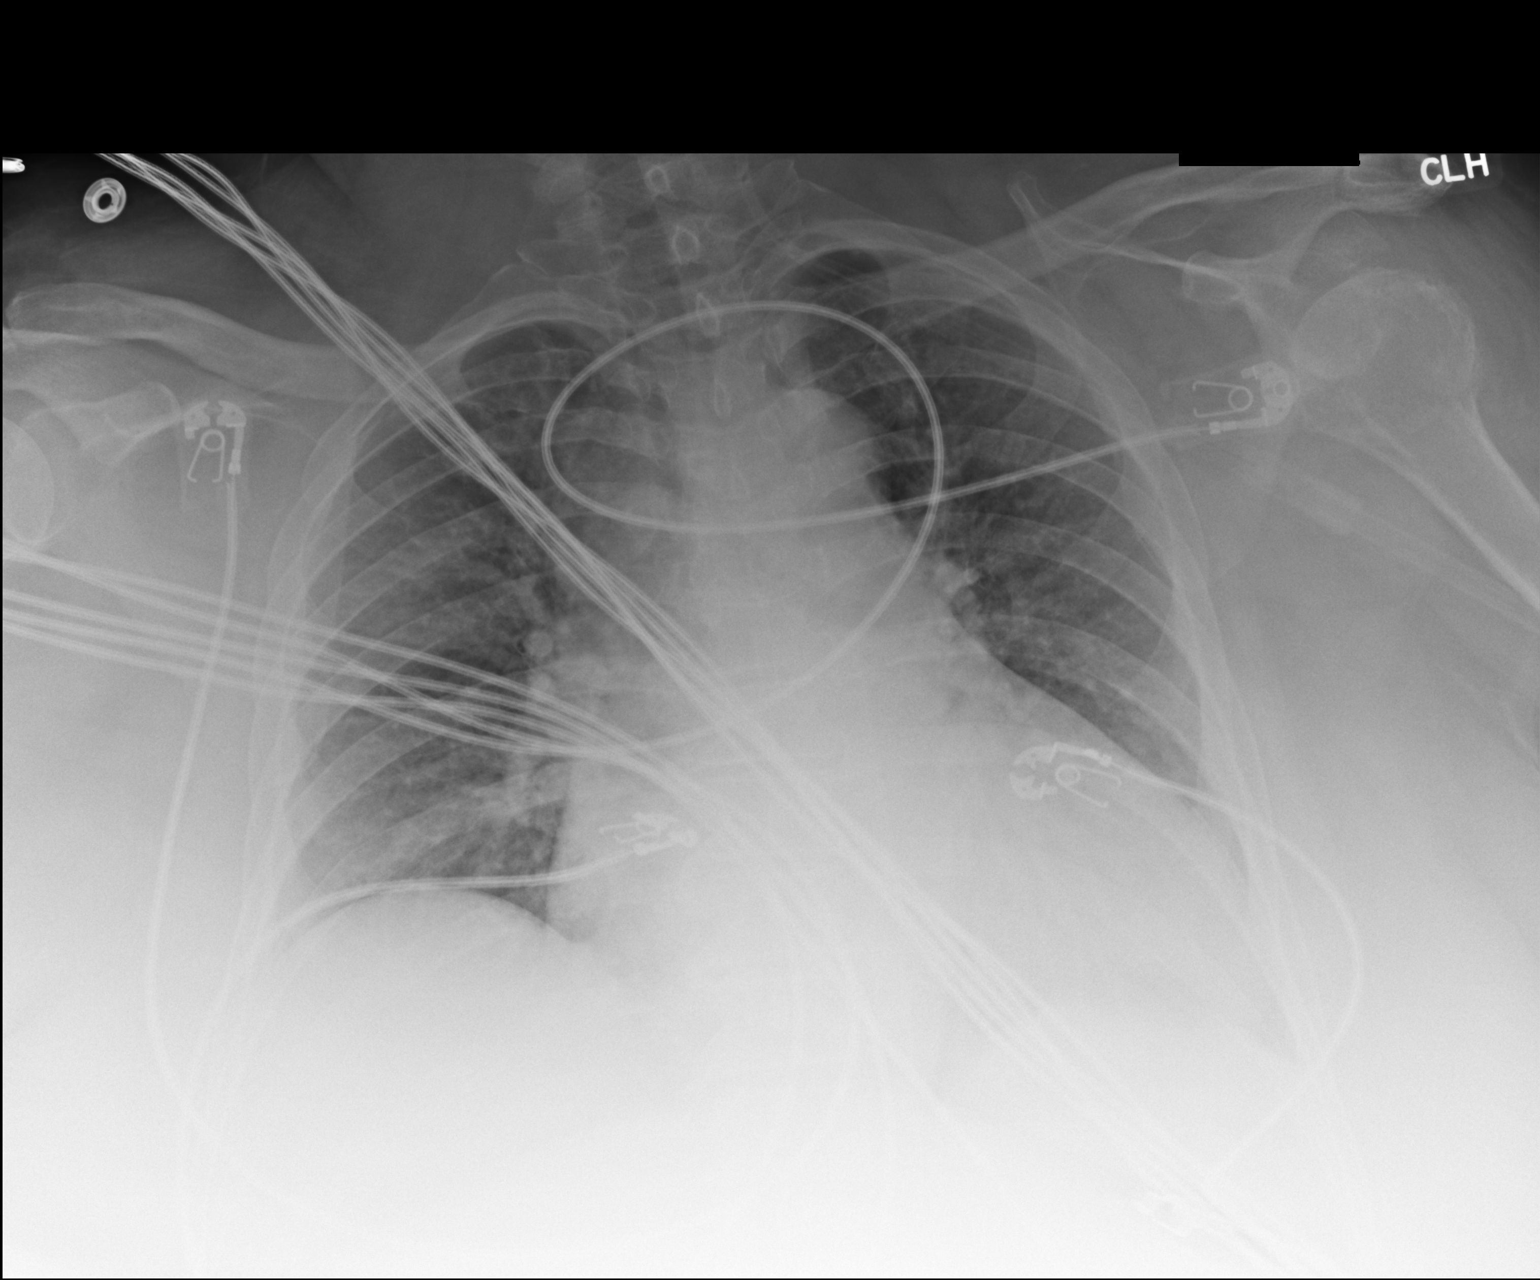

[1 of 1 positions shown; findings below may reference images not displayed]

FINDINGS: Trachea is midline given patient rotation. Heart is enlarged,
stable. Lungs are somewhat low in volume with probable vascular
crowding. No definite pleural fluid.
IMPRESSION: Low lung volumes with probable vascular crowding. No definite edema.

## 2016-09-26 IMAGING — CR DG CHEST 1V PORT
1 series · 1 of 1 positions shown · non-contrast
Comparison: Radiograph 05/28/2015

CLINICAL DATA: Congestive heart failure

EXAM:
PORTABLE CHEST 1 VIEW

[AP]
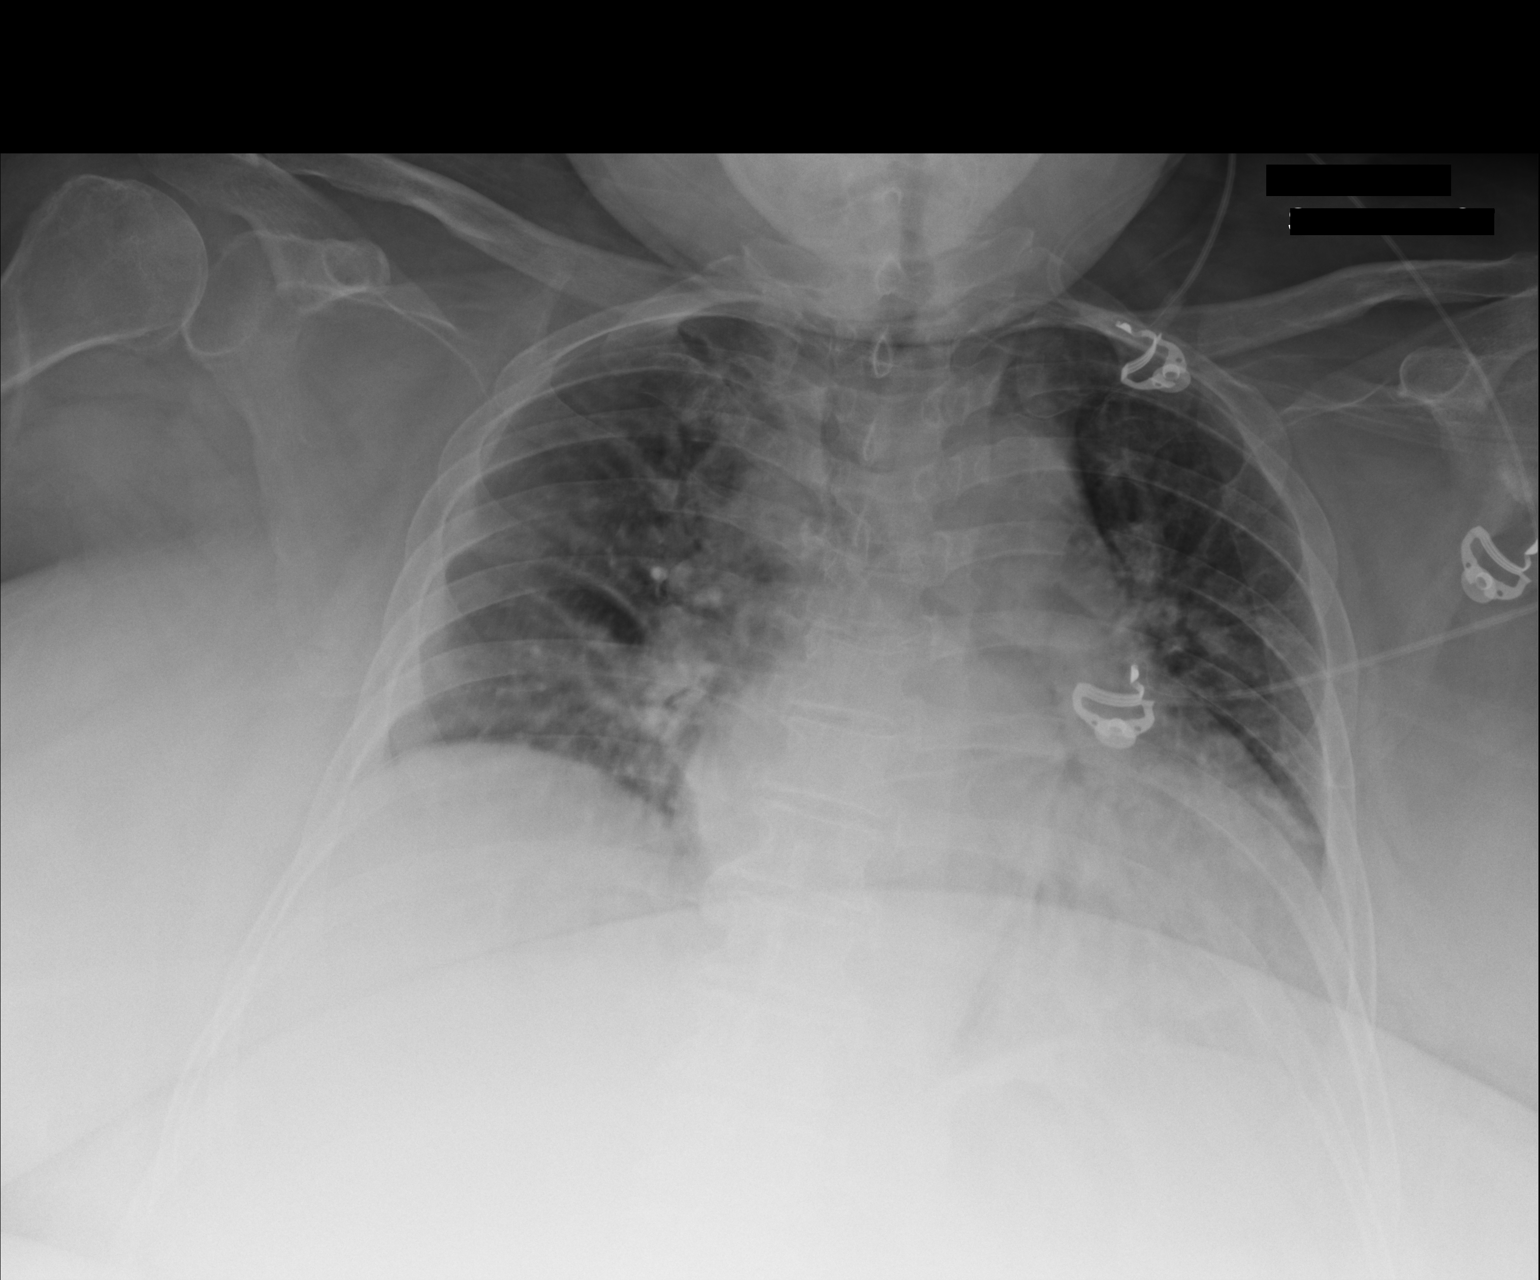

[1 of 1 positions shown; findings below may reference images not displayed]

FINDINGS: Normal cardiac silhouette. Low lung volumes. Increase in central
venous congestion. No pneumothorax.
IMPRESSION: Decreased lung volumes and increased central venous congestion.

## 2017-11-12 IMAGING — US US RENAL
1 series · 14 of 25 positions shown · non-contrast
Comparison: 06/20/2010 CT abdomen/ pelvis. 12/26/2009 abdominal
sonogram.

CLINICAL DATA: Acute kidney injury.

EXAM:
RENAL / URINARY TRACT ULTRASOUND COMPLETE

[Series 1: us renal · 0.31mm/px · 14 of 30 slices shown]
[im 1/30]
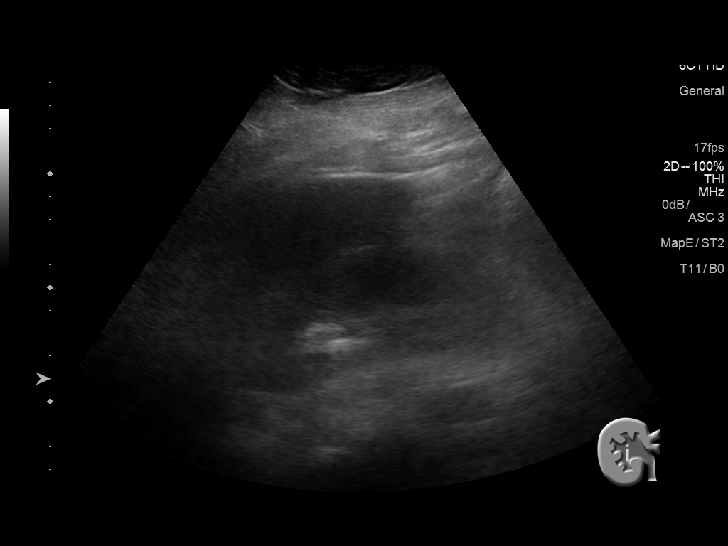
[im 3/30]
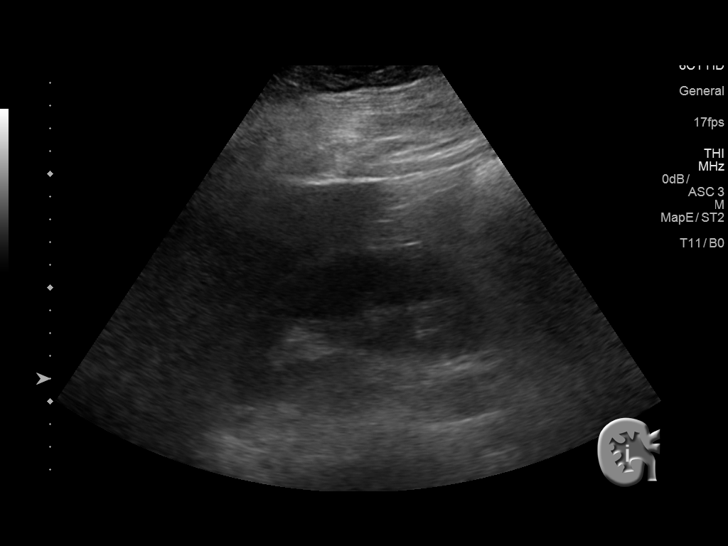
[im 5/30]
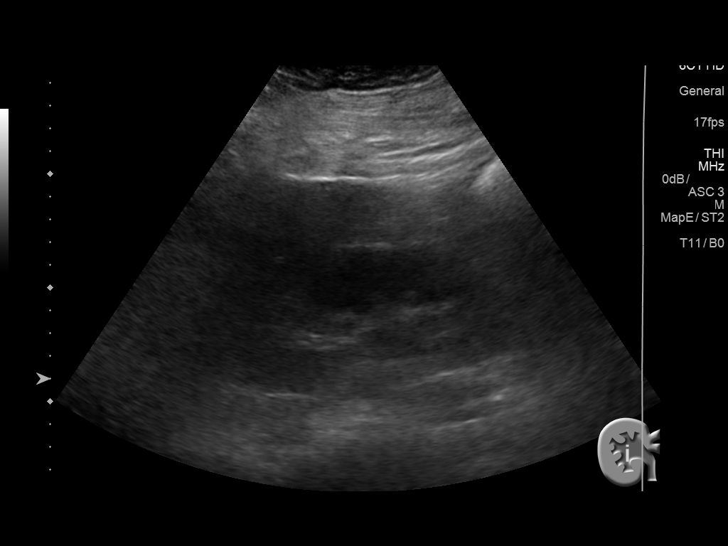
[im 8/30]
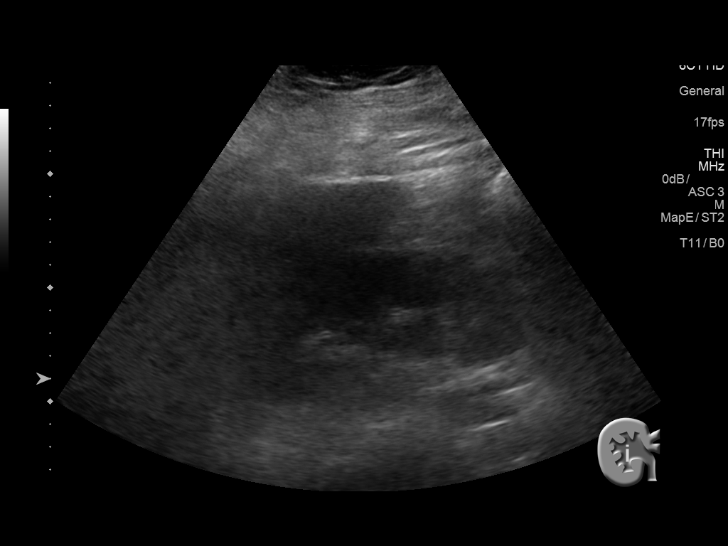
[im 10/30]
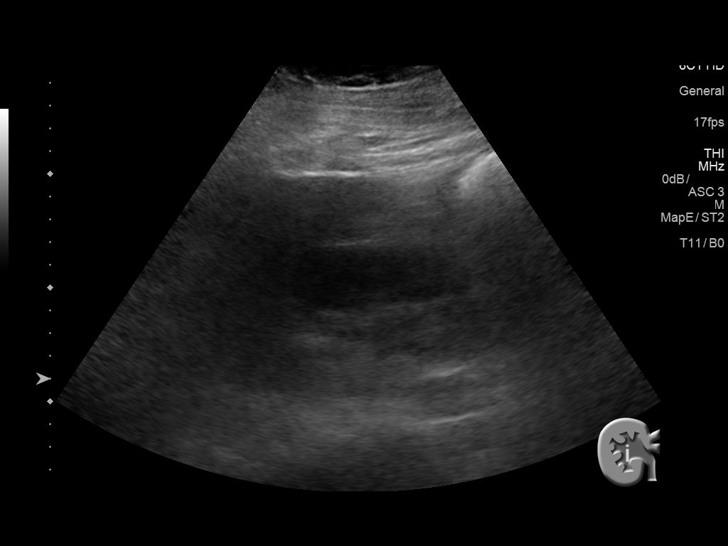
[im 11/30]
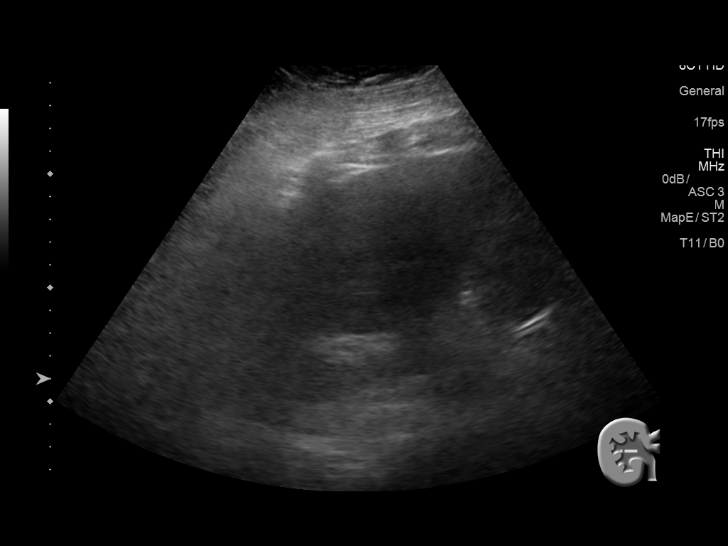
[im 14/30]
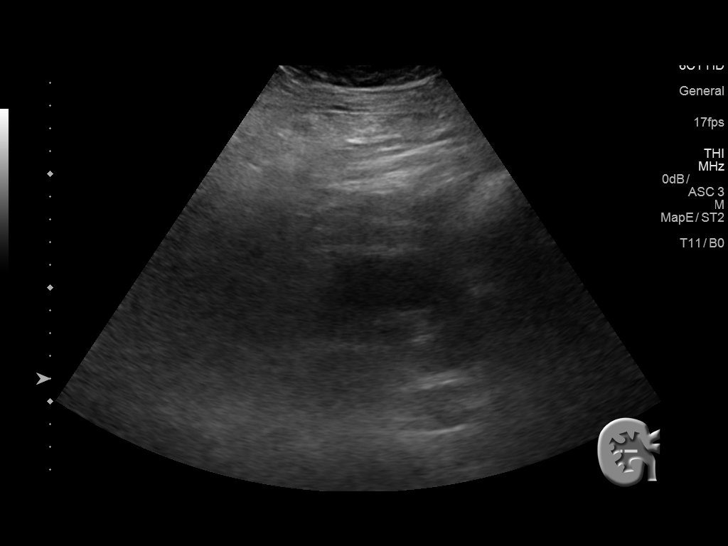
[im 16/30]
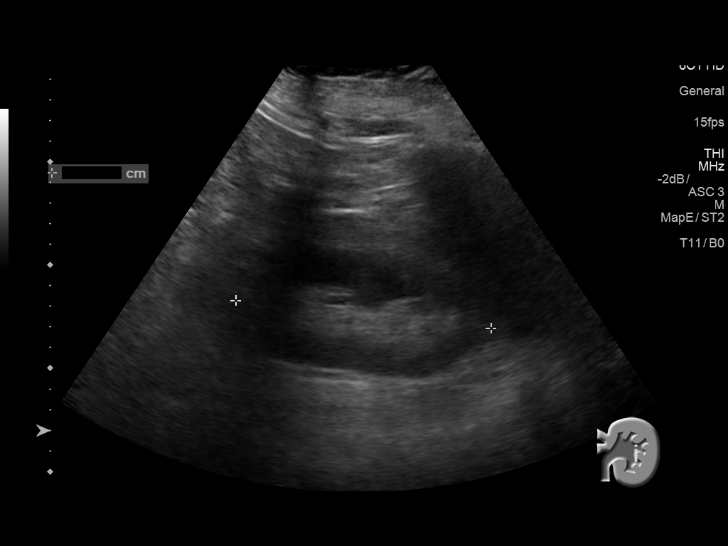
[im 19/30]
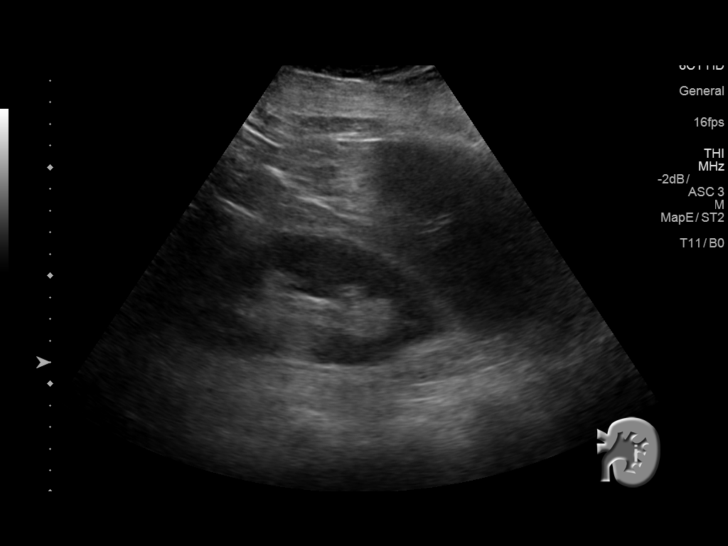
[im 20/30]
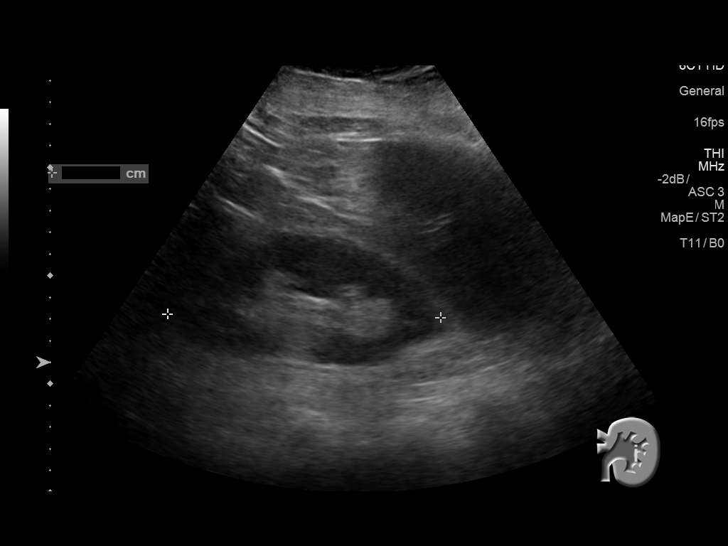
[im 22/30]
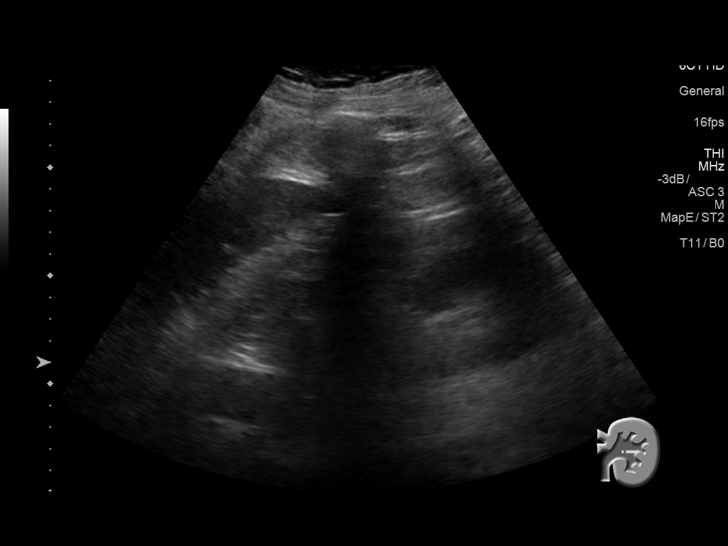
[im 25/30]
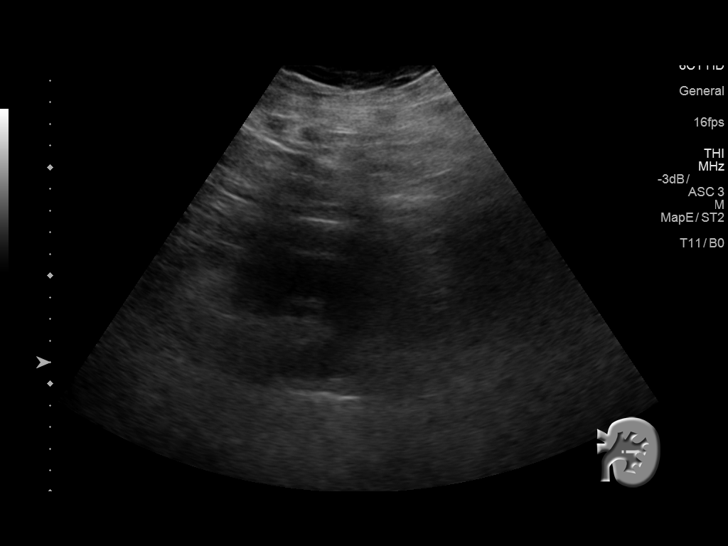
[im 27/30]
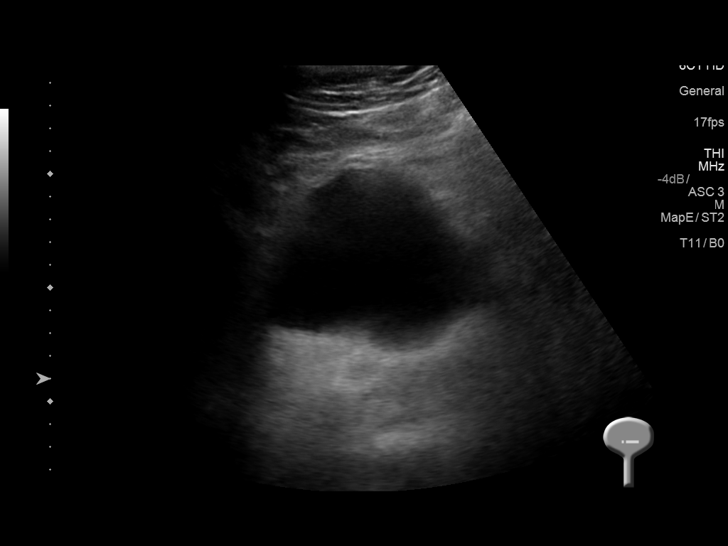
[im 30/30]
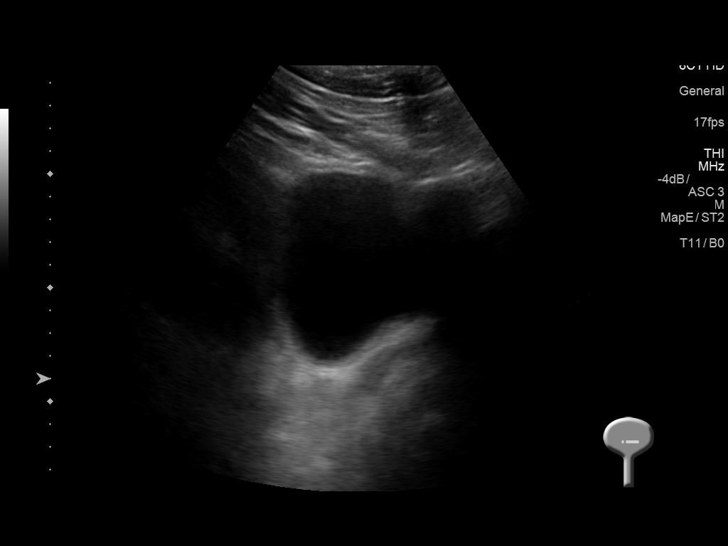

[14 of 25 positions shown; findings below may reference images not displayed]

FINDINGS: Evaluation is significantly limited by patient related factors.

Right Kidney:

Length: 13.6 cm. Echogenicity within normal limits. No mass or
hydronephrosis visualized.

Left Kidney:

Length: 12.6 cm. Echogenicity within normal limits. No mass or
hydronephrosis visualized.

Bladder:

Appears normal for degree of bladder distention.
IMPRESSION: Limited scan, with grossly normal kidneys and bladder. No
hydronephrosis.
